# Patient Record
Sex: Female | Born: 1960
Health system: Southern US, Community
[De-identification: ages and names within clinical notes are randomized; demographics above are authoritative.]

## PROBLEM LIST (undated history)

## (undated) DIAGNOSIS — D869 Sarcoidosis, unspecified: Secondary | ICD-10-CM

## (undated) DIAGNOSIS — J45909 Unspecified asthma, uncomplicated: Secondary | ICD-10-CM

## (undated) DIAGNOSIS — Z9289 Personal history of other medical treatment: Secondary | ICD-10-CM

## (undated) DIAGNOSIS — N289 Disorder of kidney and ureter, unspecified: Secondary | ICD-10-CM

## (undated) DIAGNOSIS — F329 Major depressive disorder, single episode, unspecified: Secondary | ICD-10-CM

## (undated) DIAGNOSIS — IMO0002 Reserved for concepts with insufficient information to code with codable children: Secondary | ICD-10-CM

## (undated) DIAGNOSIS — M1990 Unspecified osteoarthritis, unspecified site: Secondary | ICD-10-CM

## (undated) DIAGNOSIS — I509 Heart failure, unspecified: Secondary | ICD-10-CM

## (undated) DIAGNOSIS — E785 Hyperlipidemia, unspecified: Secondary | ICD-10-CM

## (undated) DIAGNOSIS — F32A Depression, unspecified: Secondary | ICD-10-CM

## (undated) DIAGNOSIS — R918 Other nonspecific abnormal finding of lung field: Secondary | ICD-10-CM

## (undated) DIAGNOSIS — R51 Headache: Secondary | ICD-10-CM

## (undated) DIAGNOSIS — I219 Acute myocardial infarction, unspecified: Secondary | ICD-10-CM

## (undated) DIAGNOSIS — I10 Essential (primary) hypertension: Secondary | ICD-10-CM

## (undated) HISTORY — DX: Reserved for concepts with insufficient information to code with codable children: IMO0002

## (undated) HISTORY — DX: Major depressive disorder, single episode, unspecified: F32.9

## (undated) HISTORY — DX: Depression, unspecified: F32.A

## (undated) HISTORY — DX: Hyperlipidemia, unspecified: E78.5

## (undated) HISTORY — DX: Unspecified asthma, uncomplicated: J45.909

## (undated) HISTORY — DX: Sarcoidosis, unspecified: D86.9

## (undated) HISTORY — DX: Other nonspecific abnormal finding of lung field: R91.8

---
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
## 2002-05-30 ENCOUNTER — Emergency Department (HOSPITAL_COMMUNITY): Admission: EM | Admit: 2002-05-30 | Discharge: 2002-05-30 | Payer: Self-pay | Admitting: Emergency Medicine

## 2003-08-10 ENCOUNTER — Emergency Department (HOSPITAL_COMMUNITY): Admission: EM | Admit: 2003-08-10 | Discharge: 2003-08-10 | Payer: Self-pay | Admitting: Emergency Medicine

## 2003-08-10 ENCOUNTER — Encounter: Payer: Self-pay | Admitting: Emergency Medicine

## 2004-08-04 ENCOUNTER — Emergency Department (HOSPITAL_COMMUNITY): Admission: EM | Admit: 2004-08-04 | Discharge: 2004-08-04 | Payer: Self-pay | Admitting: Emergency Medicine

## 2004-11-30 ENCOUNTER — Ambulatory Visit (HOSPITAL_BASED_OUTPATIENT_CLINIC_OR_DEPARTMENT_OTHER): Admission: RE | Admit: 2004-11-30 | Discharge: 2004-11-30 | Payer: Self-pay | Admitting: Family Medicine

## 2005-02-24 ENCOUNTER — Emergency Department (HOSPITAL_COMMUNITY): Admission: EM | Admit: 2005-02-24 | Discharge: 2005-02-24 | Payer: Self-pay | Admitting: Emergency Medicine

## 2005-03-07 ENCOUNTER — Encounter: Admission: RE | Admit: 2005-03-07 | Discharge: 2005-04-10 | Payer: Self-pay | Admitting: Orthopedic Surgery

## 2006-05-13 ENCOUNTER — Emergency Department (HOSPITAL_COMMUNITY): Admission: EM | Admit: 2006-05-13 | Discharge: 2006-05-14 | Payer: Self-pay | Admitting: Emergency Medicine

## 2006-08-02 ENCOUNTER — Emergency Department (HOSPITAL_COMMUNITY): Admission: EM | Admit: 2006-08-02 | Discharge: 2006-08-03 | Payer: Self-pay | Admitting: Emergency Medicine

## 2010-02-23 ENCOUNTER — Emergency Department (HOSPITAL_COMMUNITY): Admission: EM | Admit: 2010-02-23 | Discharge: 2010-02-23 | Payer: Self-pay | Admitting: Emergency Medicine

## 2010-07-03 ENCOUNTER — Emergency Department (HOSPITAL_COMMUNITY): Admission: EM | Admit: 2010-07-03 | Discharge: 2010-07-04 | Payer: Self-pay | Admitting: Emergency Medicine

## 2011-01-15 ENCOUNTER — Encounter: Payer: Self-pay | Admitting: Family Medicine

## 2011-03-11 LAB — POCT I-STAT, CHEM 8
BUN: 8 mg/dL (ref 6–23)
Calcium, Ion: 1.21 mmol/L (ref 1.12–1.32)
Chloride: 105 mEq/L (ref 96–112)
Creatinine, Ser: 0.9 mg/dL (ref 0.4–1.2)
Glucose, Bld: 108 mg/dL — ABNORMAL HIGH (ref 70–99)
HCT: 39 % (ref 36.0–46.0)
Hemoglobin: 13.3 g/dL (ref 12.0–15.0)
Potassium: 3.2 mEq/L — ABNORMAL LOW (ref 3.5–5.1)
Sodium: 142 mEq/L (ref 135–145)
TCO2: 27 mmol/L (ref 0–100)

## 2011-03-11 LAB — D-DIMER, QUANTITATIVE: D-Dimer, Quant: 0.84 ug/mL-FEU — ABNORMAL HIGH (ref 0.00–0.48)

## 2011-05-11 NOTE — Procedures (Signed)
Kari [name], Kari [name]             ACCOUNT NO.:  [id number]   MEDICAL RECORD NO.:  [id number]          PATIENT TYPE:  OUT   LOCATION:  SLEEP CENTER                 FACILITY:  [facility]   PHYSICIAN:  Clinton D. Annamaria Boots, M.D. DATE OF BIRTH:  1961-05-18   DATE OF STUDY:  11/30/2004                              NOCTURNAL POLYSOMNOGRAM   REFERRING PHYSICIAN:  Dr. Lucianne Lei.   INDICATION FOR STUDY:  Hypersomnia with sleep apnea.   EPWORTH SLEEPINESS SCORE:  Epworth score is 12/24. BMI 48.4. Weight 309  pounds.   SLEEP ARCHITECTURE:  Total sleep time 333 minutes with sleep efficiency of  77%.  Stage 1 was 4%, stage 2 86%, stages 3, 4 and 5% REM was 6% of total  sleep time. Sleep latency 61 minutes. REM latency 283 minutes.  Awake after  sleep onset 38 minutes. Arousable index 14.   RESPIRATORY DATA:  RDI 4.0 per hour which is within normal limits for  frequency of obstructive events and does not meet diagnostic criteria for  obstructive sleep apnea syndrome.  There was one obstructive apnea and 21  hypopnea's.  Events were not positional but most sleep was supine.  REM RDI  was 40 per hour.   OXYGEN DATA:  Very light occasional snoring with oxygen desaturation with a  nadir of 85%.  Mean oxygen saturation through the study was 94% on room air.   CARDIAC DATA:  Normal sinus rhythm with frequent PVCs, some multifocal.   MOVEMENT/PARASOMNIA:  Occasional leg jerk with insignificant effect on  sleep.   IMPRESSION/RECOMMENDATION:  Occasional obstructive apnea and hypopnea's, RDI  4 per hour, within normal limits and not meeting scoring criteria for  obstructive sleep apnea syndrome.  The patient may benefit from weight loss  and recommendation that she sleep off ____ her back.  Incidental note that  there were frequent PVCs.                                                           Clinton D. Annamaria Boots, M.D.  Diplomate, [employer]   CDY/MEDQ  D:  12/03/2004 13:28:04  T:  12/04/2004  [id number]  Job:  [id number]

## 2011-12-22 ENCOUNTER — Encounter: Payer: Self-pay | Admitting: *Deleted

## 2011-12-22 ENCOUNTER — Emergency Department (HOSPITAL_COMMUNITY)
Admission: EM | Admit: 2011-12-22 | Discharge: 2011-12-22 | Disposition: A | Discharge status: Home / Self Care | Payer: Medicaid Other | Source: Home / Self Care | Attending: Emergency Medicine | Admitting: Emergency Medicine

## 2011-12-22 DIAGNOSIS — J45909 Unspecified asthma, uncomplicated: Secondary | ICD-10-CM

## 2011-12-22 HISTORY — DX: Essential (primary) hypertension: I10

## 2011-12-22 MED ORDER — GUAIFENESIN-CODEINE 100-10 MG/5ML PO SYRP: 10.0000 mL | ORAL_SOLUTION | Freq: Four times a day (QID) | ORAL | Status: AC | PRN

## 2011-12-22 MED ORDER — METHYLPREDNISOLONE SODIUM SUCC 125 MG IJ SOLR
125.0000 mg | Freq: Once | INTRAMUSCULAR | Status: AC
  Administered 2011-12-22: 125 mg via INTRAMUSCULAR

## 2011-12-22 MED ORDER — ALBUTEROL SULFATE (5 MG/ML) 0.5% IN NEBU
INHALATION_SOLUTION | RESPIRATORY_TRACT | Status: AC
  Filled 2011-12-22: qty 1

## 2011-12-22 MED ORDER — PREDNISONE 10 MG PO TABS: ORAL_TABLET | ORAL | Status: DC

## 2011-12-22 MED ORDER — ALBUTEROL SULFATE HFA 108 (90 BASE) MCG/ACT IN AERS: 1.0000 | INHALATION_SPRAY | Freq: Four times a day (QID) | RESPIRATORY_TRACT | Status: DC | PRN

## 2011-12-22 MED ORDER — AZITHROMYCIN 250 MG PO TABS: ORAL_TABLET | ORAL | Status: AC

## 2011-12-22 MED ORDER — IPRATROPIUM BROMIDE 0.02 % IN SOLN
0.5000 mg | Freq: Once | RESPIRATORY_TRACT | Status: AC
  Administered 2011-12-22: 0.5 mg via RESPIRATORY_TRACT

## 2011-12-22 MED ORDER — ALBUTEROL SULFATE (5 MG/ML) 0.5% IN NEBU
5.0000 mg | INHALATION_SOLUTION | Freq: Once | RESPIRATORY_TRACT | Status: AC
  Administered 2011-12-22: 5 mg via RESPIRATORY_TRACT

## 2011-12-22 MED ORDER — METHYLPREDNISOLONE SODIUM SUCC 125 MG IJ SOLR
INTRAMUSCULAR | Status: AC
  Filled 2011-12-22: qty 2

## 2011-12-22 NOTE — ED Notes (Signed)
Breathing treatment in progress.  Pt demonstrating proper use.

## 2011-12-22 NOTE — ED Notes (Signed)
Breathing treatment complete.  Pt states she is feeling a little better following treatment.  Occasional faint inspiratory wheezing to right upper lung.

## 2011-12-22 NOTE — ED Provider Notes (Signed)
History     CSN: [id number]  Arrival date & time 12/22/11  1212   First MD Initiated Contact with Patient 12/22/11 1408      Chief Complaint  Patient presents with  . Nasal Congestion  . Cough  . Wheezing  . Headache  . Generalized Body Aches    (Consider location/radiation/quality/duration/timing/severity/associated sxs/prior treatment) HPI Comments: Shawntelle has had a one-week history of aching all over, chills, fatigue, wheezing, shortness of breath, dry cough, chest pain, headache, rhinorrhea, nausea, vomiting, and diarrhea. She denies any fever and she has had no prior history of asthma or COPD. She has never smoked cigarettes.  Patient is a 50 y.o. female presenting with cough, wheezing, and headaches.  Cough Associated symptoms include chills, headaches, rhinorrhea and wheezing. Pertinent negatives include no ear pain, no sore throat, no shortness of breath and no eye redness.  Wheezing  Associated symptoms include rhinorrhea, cough and wheezing. Pertinent negatives include no fever, no sore throat and no shortness of breath.  Headache The primary symptoms include headaches, nausea and vomiting. Primary symptoms do not include fever.  The headache is not associated with eye pain or neck stiffness.  Additional symptoms do not include neck stiffness.    Past Medical History  Diagnosis Date  . Hypertension     History reviewed. No pertinent past surgical history.  History reviewed. No pertinent family history.  History  Substance Use Topics  . Smoking status: Never Smoker   . Smokeless tobacco: Not on file  . Alcohol Use: No    OB History    Grav Para Term Preterm Abortions TAB SAB Ect Mult Living                  Review of Systems  Constitutional: Positive for chills and fatigue. Negative for fever.  HENT: Positive for rhinorrhea. Negative for ear pain, congestion, sore throat, sneezing, neck stiffness, voice change and postnasal drip.   Eyes: Negative for  pain, discharge and redness.  Respiratory: Positive for cough and wheezing. Negative for chest tightness and shortness of breath.   Gastrointestinal: Positive for nausea, vomiting and diarrhea. Negative for abdominal pain.  Skin: Negative for rash.  Neurological: Positive for headaches.    Allergies  Review of patient's allergies indicates no known allergies.  Home Medications   Current Outpatient Rx  Name Route Sig Dispense Refill  . LISINOPRIL-HYDROCHLOROTHIAZIDE PO Oral Take by mouth.      Marland Kitchen ALKA-SELTZER PLS NIGHT CLD/FLU PO Oral Take by mouth.      Marland Kitchen PSEUDOEPHEDRINE-ACETAMINOPHEN 30-500 MG PO TABS Oral Take 1 tablet by mouth every 4 (four) hours as needed.      . ALBUTEROL SULFATE HFA 108 (90 BASE) MCG/ACT IN AERS Inhalation Inhale 1-2 puffs into the lungs every 6 (six) hours as needed for wheezing. 1 Inhaler 0  . AZITHROMYCIN 250 MG PO TABS  Take as directed. 6 tablet 0  . GUAIFENESIN-CODEINE 100-10 MG/5ML PO SYRP Oral Take 10 mLs by mouth 4 (four) times daily as needed for cough. 120 mL 0  . PREDNISONE 10 MG PO TABS  Take 4 tabs daily for 4 days, 3 tabs daily for 4 days, 2 tabs daily for 4 days, then 1 tab daily for 4 days.  Take all tabs at one time with food and preferably in the morning except for the first dose. 40 tablet 0    BP 183/100  Pulse 72  Temp(Src) 99.1 F (37.3 C) (Oral)  Resp 26  SpO2 98%  LMP 11/21/2011  Physical Exam  Nursing note and vitals reviewed. Constitutional: She appears well-developed and well-nourished. No distress (she is in no respiratory distress).  HENT:  Head: Normocephalic and atraumatic.  Right Ear: External ear normal.  Left Ear: External ear normal.  Nose: Nose normal.  Mouth/Throat: Oropharynx is clear and moist. No oropharyngeal exudate.  Eyes: Conjunctivae and EOM are normal. Pupils are equal, round, and reactive to light. Right eye exhibits no discharge. Left eye exhibits no discharge.  Neck: Normal range of motion. Neck supple.    Cardiovascular: Normal rate, regular rhythm and normal heart sounds.   Pulmonary/Chest: Effort normal. No stridor. No respiratory distress. She has wheezes (she has bilateral expiratory wheezes anteriorly and posteriorly). She has no rales. She exhibits no tenderness.  Lymphadenopathy:    She has no cervical adenopathy.  Skin: Skin is warm and dry. No rash noted. She is not diaphoretic.    ED Course  Procedures (including critical care time)  After receiving a breathing treatment with duo neb and Solu-Medrol 125 mg IM, she felt better. Auscultation of her lungs reveals still some wheezes, but much improved. There were no rales and she had good air movement bilaterally and was in no respiratory distress.  Labs Reviewed - No data to display No results found.   1. Asthmatic bronchitis       MDM  She has asthmatic bronchitis. She improved here at the urgent care Center with [facility] and Solu-Medrol. She was sent home on a prednisone taper, a Z-Pak, albuterol inhaler, and guaifenesin/codeine cough syrup. She was told to followup in 3 or 4 days if no improvement or immediately if she should become worse in any way, especially with difficulty breathing.        Birdena Crandall, MD 12/22/11 [phone]

## 2011-12-22 NOTE — ED Notes (Signed)
Pt with onset of cough/congestion/headache/aching all over/wheezing x one week worse tody

## 2012-06-20 ENCOUNTER — Inpatient Hospital Stay (HOSPITAL_COMMUNITY)
Admission: EM | Admit: 2012-06-20 | Discharge: 2012-06-25 | DRG: 313 | Disposition: A | Discharge status: Home / Self Care | Payer: Medicaid Other | Attending: Internal Medicine | Admitting: Internal Medicine

## 2012-06-20 ENCOUNTER — Emergency Department (HOSPITAL_COMMUNITY): Payer: Medicaid Other

## 2012-06-20 ENCOUNTER — Encounter (HOSPITAL_COMMUNITY): Payer: Self-pay | Admitting: Emergency Medicine

## 2012-06-20 DIAGNOSIS — R059 Cough, unspecified: Secondary | ICD-10-CM | POA: Diagnosis present

## 2012-06-20 DIAGNOSIS — L97909 Non-pressure chronic ulcer of unspecified part of unspecified lower leg with unspecified severity: Secondary | ICD-10-CM | POA: Diagnosis present

## 2012-06-20 DIAGNOSIS — R079 Chest pain, unspecified: Secondary | ICD-10-CM

## 2012-06-20 DIAGNOSIS — L03119 Cellulitis of unspecified part of limb: Secondary | ICD-10-CM | POA: Diagnosis not present

## 2012-06-20 DIAGNOSIS — D649 Anemia, unspecified: Secondary | ICD-10-CM | POA: Diagnosis present

## 2012-06-20 DIAGNOSIS — I872 Venous insufficiency (chronic) (peripheral): Secondary | ICD-10-CM | POA: Diagnosis not present

## 2012-06-20 DIAGNOSIS — I472 Ventricular tachycardia, unspecified: Secondary | ICD-10-CM | POA: Diagnosis present

## 2012-06-20 DIAGNOSIS — B349 Viral infection, unspecified: Secondary | ICD-10-CM

## 2012-06-20 DIAGNOSIS — E876 Hypokalemia: Secondary | ICD-10-CM | POA: Diagnosis present

## 2012-06-20 DIAGNOSIS — L02419 Cutaneous abscess of limb, unspecified: Secondary | ICD-10-CM | POA: Diagnosis not present

## 2012-06-20 DIAGNOSIS — Z79899 Other long term (current) drug therapy: Secondary | ICD-10-CM

## 2012-06-20 DIAGNOSIS — R911 Solitary pulmonary nodule: Secondary | ICD-10-CM | POA: Diagnosis present

## 2012-06-20 DIAGNOSIS — I10 Essential (primary) hypertension: Secondary | ICD-10-CM | POA: Diagnosis present

## 2012-06-20 DIAGNOSIS — I4729 Other ventricular tachycardia: Secondary | ICD-10-CM | POA: Diagnosis present

## 2012-06-20 DIAGNOSIS — R509 Fever, unspecified: Secondary | ICD-10-CM

## 2012-06-20 DIAGNOSIS — IMO0002 Reserved for concepts with insufficient information to code with codable children: Secondary | ICD-10-CM

## 2012-06-20 DIAGNOSIS — R0789 Other chest pain: Principal | ICD-10-CM | POA: Diagnosis present

## 2012-06-20 DIAGNOSIS — R05 Cough: Secondary | ICD-10-CM | POA: Diagnosis present

## 2012-06-20 LAB — CBC
HCT: 38.3 % (ref 36.0–46.0)
Hemoglobin: 12.8 g/dL (ref 12.0–15.0)
MCH: 30 pg (ref 26.0–34.0)
MCHC: 33.4 g/dL (ref 30.0–36.0)
MCV: 89.7 fL (ref 78.0–100.0)
Platelets: 255 10*3/uL (ref 150–400)
RBC: 4.27 MIL/uL (ref 3.87–5.11)
RDW: 14 % (ref 11.5–15.5)
WBC: 8.7 10*3/uL (ref 4.0–10.5)

## 2012-06-20 LAB — URINALYSIS, ROUTINE W REFLEX MICROSCOPIC
Bilirubin Urine: NEGATIVE
Glucose, UA: NEGATIVE mg/dL
Hgb urine dipstick: NEGATIVE
Ketones, ur: NEGATIVE mg/dL
Nitrite: NEGATIVE
Protein, ur: NEGATIVE mg/dL
Specific Gravity, Urine: 1.018 (ref 1.005–1.030)
Urobilinogen, UA: 1 mg/dL (ref 0.0–1.0)
pH: 7.5 (ref 5.0–8.0)

## 2012-06-20 LAB — BASIC METABOLIC PANEL
BUN: 11 mg/dL (ref 6–23)
CO2: 30 mEq/L (ref 19–32)
Calcium: 9.5 mg/dL (ref 8.4–10.5)
Chloride: 98 mEq/L (ref 96–112)
Creatinine, Ser: 0.82 mg/dL (ref 0.50–1.10)
GFR calc Af Amer: >90 mL/min (ref >90)
GFR calc non Af Amer: 82 mL/min — ABNORMAL LOW (ref >90)
Glucose, Bld: 123 mg/dL — ABNORMAL HIGH (ref 70–99)
Potassium: 2.8 mEq/L — ABNORMAL LOW (ref 3.5–5.1)
Sodium: 136 mEq/L (ref 135–145)

## 2012-06-20 LAB — POCT I-STAT TROPONIN I: Troponin i, poc: 0 ng/mL (ref 0.00–0.08)

## 2012-06-20 LAB — URINE MICROSCOPIC-ADD ON

## 2012-06-20 MED ORDER — POTASSIUM CHLORIDE CRYS ER 20 MEQ PO TBCR
20.0000 meq | EXTENDED_RELEASE_TABLET | Freq: Once | ORAL | Status: AC
  Administered 2012-06-20: 20 meq via ORAL
  Filled 2012-06-20: qty 1

## 2012-06-20 MED ORDER — ACETAMINOPHEN 325 MG PO TABS
650.0000 mg | ORAL_TABLET | Freq: Once | ORAL | Status: AC
  Administered 2012-06-20: 650 mg via ORAL
  Filled 2012-06-20: qty 1

## 2012-06-20 MED ORDER — SODIUM CHLORIDE 0.9 % IV BOLUS (SEPSIS)
500.0000 mL | Freq: Once | INTRAVENOUS | Status: AC
  Administered 2012-06-20: 500 mL via INTRAVENOUS

## 2012-06-20 NOTE — ED Provider Notes (Signed)
History     CSN: [id number]  Arrival date & time 06/20/12  1824   None     Chief Complaint  Patient presents with  . Chest Pain    (Consider location/radiation/quality/duration/timing/severity/associated sxs/prior treatment) HPI  51 year old female past medical history of hypertension and intermittent leg swelling presents today with a 10 hour history of central chest pressure which started this morning. It has been constant and getting better throughout the day. Is a 2/10 right now. She's had a history of leg swelling, which her doctor treated with lisinopril intermittently. She has no history of heart disease. Chest and arm or jaw pain, nausea or vomiting diarrhea, and she denies for me any shortness of breath as described in the nursing notes. She said that she is slow to breathe because there is a pressure when she breathes, but she is not short of breath. She also denies diaphoresis. She denies any meaningful dyspnea with exertion. She denies any orthopnea or paroxysmal nocturnal dyspnea. Nothing makes her symptoms better or worse. She calls her illness mild to moderate.  She is tachycardic on arrival but otherwise her vital signs are stable.  Past Medical History  Diagnosis Date  . Hypertension     History reviewed. No pertinent past surgical history.  No family history on file.  History  Substance Use Topics  . Smoking status: Never Smoker   . Smokeless tobacco: Not on file  . Alcohol Use: No    OB History    Grav Para Term Preterm Abortions TAB SAB Ect Mult Living                  Review of Systems Constitutional: Negative for fever and chills.  HENT: Negative for ear pain, sore throat and trouble swallowing.   Eyes: Negative for pain and visual disturbance.  Respiratory: Negative for cough and shortness of breath.   Cardiovascular: POS for chest pain and leg swelling.  Gastrointestinal: Negative for nausea, vomiting, abdominal pain and diarrhea.    Genitourinary: Negative for dysuria, urgency and frequency.  Musculoskeletal: Negative for back pain and joint swelling.  Skin: Negative for rash and wound.  Neurological: Negative for dizziness, syncope, speech difficulty, weakness and numbness.   Allergies  Review of patient's allergies indicates no known allergies.  Home Medications   Current Outpatient Rx  Name Route Sig Dispense Refill  . ALBUTEROL SULFATE HFA 108 (90 BASE) MCG/ACT IN AERS Inhalation Inhale 1-2 puffs into the lungs every 6 (six) hours as needed for wheezing. 1 Inhaler 0  . LISINOPRIL-HYDROCHLOROTHIAZIDE PO Oral Take by mouth.      Marland Kitchen ALKA-SELTZER PLS NIGHT CLD/FLU PO Oral Take by mouth.      Marland Kitchen PREDNISONE 10 MG PO TABS  Take 4 tabs daily for 4 days, 3 tabs daily for 4 days, 2 tabs daily for 4 days, then 1 tab daily for 4 days.  Take all tabs at one time with food and preferably in the morning except for the first dose. 40 tablet 0  . PSEUDOEPHEDRINE-ACETAMINOPHEN 30-500 MG PO TABS Oral Take 1 tablet by mouth every 4 (four) hours as needed.        BP 142/91  Pulse 112  Temp 101.8 F (38.8 C) (Oral)  Resp 16  SpO2 98%  LMP 11/21/2011  Physical Exam Consitutional: Pt in no acute distress.   Head: Normocephalic and atraumatic.  Eyes: Extraocular motion intact, no scleral icterus. Mild to moderate injection.  Neck: Supple without meningismus, mass, or overt JVD  Respiratory: Effort normal and breath sounds normal. No respiratory distress. CV: Heart regular rate and regular rhythm (sinus), no obvious murmurs.  Pulses +2 and symmetric Abdomen: Soft, non-tender, non-distended. No rebound or guarding.  MSK: Extremities are atraumatic without deformity, ROM intact.  2+ bilateral pitting edema lower studies. Weeping lesion left lateral calf has actually gotten better recently. Skin: Warm, dry, intact Neuro: Alert and oriented, no motor deficit noted.   Psychiatric: Mood and affect are normal    ED Course   Procedures (including critical care time)  Labs Reviewed  BASIC METABOLIC PANEL - Abnormal; Notable for the following:    Potassium 2.8 (*)     Glucose, Bld 123 (*)     GFR calc non Af Amer 82 (*)     All other components within normal limits  CBC  POCT I-STAT TROPONIN I   Dg Chest 2 View  06/20/2012  *RADIOLOGY REPORT*  Clinical Data: Chest pain  CHEST - 2 VIEW  Comparison: 05/13/2006  Findings: Heart size appears enlarged.  No pleural effusion or edema.  No airspace consolidation.  Prominence of the pulmonary arteries is noted which may be seen with PA hypertension.  IMPRESSION:  1.  Cardiac enlargement. 2.  No acute findings.  Original Report Authenticated By: Angelita Ingles, M.D.     Clinical impression: Fever.  Tachypnea. Possible PE  MDM   Patient chief complaint of chest pain. This pain is nonexertional. His been constant all day long. Her troponin is negative. Accordingly this is not consistent with acute coronary syndrome the However patient does have documented fever here.  Not consistent with pulmonary embolus. The patient is not hypoxic and does not have a sense of shortness of breath as described above under the history of present illness. She is however tachycardic, but she does have a fever. We will treat with fluids and monitor her tachycardia.   Her tachycardia has moderated nicely with fluids. However it is still present. While sleeping she is tachypneic at 44 per minute. Her chest x-ray suggests pulmonary hypertension.  Given her overall clinical picture, patient will now need a PE workup. CT angio ordered. She's been hydrated with 500 cc prior to contrast load.  Pt Care to Dr Vanessa Kick with CT angio and results from breathing treatment pending.       Edwin Cap, MD 06/21/12 [phone]

## 2012-06-20 NOTE — ED Notes (Signed)
Updated in w/r 

## 2012-06-20 NOTE — ED Notes (Signed)
Pt c/o mid upper chest soreness  onset 1400 after returning from work.  Positioning lying down decreases the pain.  Took ibuprofen at 1500 hrs  For HA with out relief.  Rates HA 5/10 frontal HA/  Denies nausea and vomiting.  States diaphoresis in waiting area.Denies previous .  Cardiac monitor 113 ST

## 2012-06-20 NOTE — ED Notes (Signed)
C/o dull pain in center of chest with mild sob and cold chills since 2pm.  Reports swelling to bilateral lower extremities.

## 2012-06-21 ENCOUNTER — Emergency Department (HOSPITAL_COMMUNITY): Payer: Medicaid Other

## 2012-06-21 DIAGNOSIS — R072 Precordial pain: Secondary | ICD-10-CM

## 2012-06-21 LAB — BASIC METABOLIC PANEL
BUN: 11 mg/dL (ref 6–23)
CO2: 27 mEq/L (ref 19–32)
Calcium: 8.8 mg/dL (ref 8.4–10.5)
Chloride: 101 mEq/L (ref 96–112)
Creatinine, Ser: 0.82 mg/dL (ref 0.50–1.10)
GFR calc Af Amer: >90 mL/min (ref >90)
GFR calc non Af Amer: 82 mL/min — ABNORMAL LOW (ref >90)
Glucose, Bld: 136 mg/dL — ABNORMAL HIGH (ref 70–99)
Potassium: 3.2 mEq/L — ABNORMAL LOW (ref 3.5–5.1)
Sodium: 138 mEq/L (ref 135–145)

## 2012-06-21 LAB — CARDIAC PANEL(CRET KIN+CKTOT+MB+TROPI)
CK, MB: 2 ng/mL (ref 0.3–4.0)
CK, MB: 2.2 ng/mL (ref 0.3–4.0)
CK, MB: 2 ng/mL (ref 0.3–4.0)
Relative Index: 1.5 (ref 0.0–2.5)
Relative Index: 1.7 (ref 0.0–2.5)
Relative Index: 1.5 (ref 0.0–2.5)
Total CK: 133 U/L (ref 7–177)
Total CK: 133 U/L (ref 7–177)
Total CK: 133 U/L (ref 7–177)
Troponin I: <0.3 ng/mL (ref <0.30)
Troponin I: <0.3 ng/mL (ref <0.30)
Troponin I: <0.3 ng/mL (ref <0.30)

## 2012-06-21 LAB — MAGNESIUM: Magnesium: 1.8 mg/dL (ref 1.5–2.5)

## 2012-06-21 LAB — LIPID PANEL
Cholesterol: 135 mg/dL (ref 0–200)
HDL: 54 mg/dL (ref >39)
LDL Cholesterol: 69 mg/dL (ref 0–99)
Total CHOL/HDL Ratio: 2.5 RATIO
Triglycerides: 59 mg/dL (ref <150)
VLDL: 12 mg/dL (ref 0–40)

## 2012-06-21 LAB — PHOSPHORUS: Phosphorus: 3.1 mg/dL (ref 2.3–4.6)

## 2012-06-21 LAB — HEMOGLOBIN A1C
Hgb A1c MFr Bld: 6 % — ABNORMAL HIGH (ref <5.7)
Mean Plasma Glucose: 126 mg/dL — ABNORMAL HIGH (ref <117)

## 2012-06-21 LAB — TSH: TSH: 2.323 u[IU]/mL (ref 0.350–4.500)

## 2012-06-21 LAB — LACTIC ACID, PLASMA: Lactic Acid, Venous: 1.1 mmol/L (ref 0.5–2.2)

## 2012-06-21 LAB — PRO B NATRIURETIC PEPTIDE: Pro B Natriuretic peptide (BNP): 415.7 pg/mL — ABNORMAL HIGH (ref 0–125)

## 2012-06-21 MED ORDER — ACETAMINOPHEN 325 MG PO TABS
650.0000 mg | ORAL_TABLET | Freq: Four times a day (QID) | ORAL | Status: DC | PRN
  Administered 2012-06-22: 650 mg via ORAL
  Filled 2012-06-21: qty 2

## 2012-06-21 MED ORDER — BACITRACIN-NEOMYCIN-POLYMYXIN OINTMENT TUBE
TOPICAL_OINTMENT | Freq: Four times a day (QID) | CUTANEOUS | Status: DC | PRN
  Filled 2012-06-21: qty 15

## 2012-06-21 MED ORDER — ALBUTEROL SULFATE (5 MG/ML) 0.5% IN NEBU: 5.0000 mg | INHALATION_SOLUTION | Freq: Once | RESPIRATORY_TRACT | Status: DC

## 2012-06-21 MED ORDER — SODIUM CHLORIDE 0.9 % IJ SOLN
3.0000 mL | Freq: Two times a day (BID) | INTRAMUSCULAR | Status: DC
  Administered 2012-06-21 – 2012-06-24 (×7): 3 mL via INTRAVENOUS

## 2012-06-21 MED ORDER — ZOLPIDEM TARTRATE 5 MG PO TABS
5.0000 mg | ORAL_TABLET | Freq: Every evening | ORAL | Status: DC | PRN
  Administered 2012-06-22: 5 mg via ORAL
  Filled 2012-06-21: qty 1

## 2012-06-21 MED ORDER — ASPIRIN EC 325 MG PO TBEC
325.0000 mg | DELAYED_RELEASE_TABLET | Freq: Every day | ORAL | Status: DC
  Administered 2012-06-21 – 2012-06-24 (×4): 325 mg via ORAL
  Filled 2012-06-21 (×3): qty 1

## 2012-06-21 MED ORDER — SODIUM CHLORIDE 0.9 % IV SOLN
INTRAVENOUS | Status: DC
  Administered 2012-06-21: 11:00:00 via INTRAVENOUS

## 2012-06-21 MED ORDER — HYDROCHLOROTHIAZIDE 12.5 MG PO CAPS
12.5000 mg | ORAL_CAPSULE | Freq: Every day | ORAL | Status: DC
  Administered 2012-06-21 – 2012-06-23 (×3): 12.5 mg via ORAL
  Filled 2012-06-21 (×5): qty 1

## 2012-06-21 MED ORDER — POTASSIUM CHLORIDE CRYS ER 20 MEQ PO TBCR
40.0000 meq | EXTENDED_RELEASE_TABLET | Freq: Once | ORAL | Status: AC
  Administered 2012-06-21: 40 meq via ORAL
  Filled 2012-06-21: qty 2

## 2012-06-21 MED ORDER — LOSARTAN POTASSIUM-HCTZ 100-12.5 MG PO TABS: 1.0000 | ORAL_TABLET | Freq: Every day | ORAL | Status: DC

## 2012-06-21 MED ORDER — ONDANSETRON HCL 4 MG/2ML IJ SOLN: 4.0000 mg | Freq: Four times a day (QID) | INTRAMUSCULAR | Status: DC | PRN

## 2012-06-21 MED ORDER — LOSARTAN POTASSIUM 50 MG PO TABS
100.0000 mg | ORAL_TABLET | Freq: Every day | ORAL | Status: DC
  Administered 2012-06-21 – 2012-06-23 (×3): 100 mg via ORAL
  Filled 2012-06-21 (×5): qty 2

## 2012-06-21 MED ORDER — HYDROCODONE-ACETAMINOPHEN 5-325 MG PO TABS
1.0000 | ORAL_TABLET | ORAL | Status: DC | PRN
  Administered 2012-06-21 – 2012-06-23 (×4): 1 via ORAL
  Administered 2012-06-24: 2 via ORAL
  Filled 2012-06-21 (×3): qty 1
  Filled 2012-06-21: qty 2
  Filled 2012-06-21: qty 1

## 2012-06-21 MED ORDER — ALBUTEROL SULFATE HFA 108 (90 BASE) MCG/ACT IN AERS: 4.0000 | INHALATION_SPRAY | Freq: Once | RESPIRATORY_TRACT | Status: DC

## 2012-06-21 MED ORDER — MORPHINE SULFATE 2 MG/ML IJ SOLN: 1.0000 mg | INTRAMUSCULAR | Status: DC | PRN

## 2012-06-21 MED ORDER — IBUPROFEN 800 MG PO TABS
800.0000 mg | ORAL_TABLET | Freq: Once | ORAL | Status: AC
  Administered 2012-06-21: 800 mg via ORAL
  Filled 2012-06-21: qty 1

## 2012-06-21 MED ORDER — ONDANSETRON HCL 4 MG PO TABS: 4.0000 mg | ORAL_TABLET | Freq: Four times a day (QID) | ORAL | Status: DC | PRN

## 2012-06-21 MED ORDER — ALBUTEROL SULFATE HFA 108 (90 BASE) MCG/ACT IN AERS: 1.0000 | INHALATION_SPRAY | Freq: Four times a day (QID) | RESPIRATORY_TRACT | Status: DC | PRN

## 2012-06-21 MED ORDER — HEPARIN SODIUM (PORCINE) 5000 UNIT/ML IJ SOLN
5000.0000 [IU] | Freq: Three times a day (TID) | INTRAMUSCULAR | Status: DC
  Administered 2012-06-21 – 2012-06-24 (×10): 5000 [IU] via SUBCUTANEOUS
  Filled 2012-06-21 (×16): qty 1

## 2012-06-21 MED ORDER — IOHEXOL 350 MG/ML SOLN
100.0000 mL | Freq: Once | INTRAVENOUS | Status: AC | PRN
  Administered 2012-06-21: 100 mL via INTRAVENOUS

## 2012-06-21 MED ORDER — ALBUTEROL SULFATE (5 MG/ML) 0.5% IN NEBU
5.0000 mg | INHALATION_SOLUTION | Freq: Once | RESPIRATORY_TRACT | Status: AC
  Administered 2012-06-21: 5 mg via RESPIRATORY_TRACT
  Filled 2012-06-21: qty 1

## 2012-06-21 MED ORDER — SODIUM CHLORIDE 0.9 % IV SOLN: INTRAVENOUS | Status: DC

## 2012-06-21 MED ORDER — PNEUMOCOCCAL VAC POLYVALENT 25 MCG/0.5ML IJ INJ
0.5000 mL | INJECTION | INTRAMUSCULAR | Status: AC
  Administered 2012-06-22: 0.5 mL via INTRAMUSCULAR
  Filled 2012-06-21: qty 0.5

## 2012-06-21 NOTE — ED Provider Notes (Deleted)
History     CSN: [id number]  Arrival date & time 06/20/12  Kari [name]   First MD Initiated Contact with Patient 06/20/12 2242      Chief Complaint  Patient presents with  . Chest Pain    (Consider location/radiation/quality/duration/timing/severity/associated sxs/prior treatment) HPI  Past Medical History  Diagnosis Date  . Hypertension     History reviewed. No pertinent past surgical history.  No family history on file.  History  Substance Use Topics  . Smoking status: Never Smoker   . Smokeless tobacco: Not on file  . Alcohol Use: No    OB History    Grav Para Term Preterm Abortions TAB SAB Ect Mult Living                  Review of Systems  Allergies  Review of patient's allergies indicates no known allergies.  Home Medications   Current Outpatient Rx  Name Route Sig Dispense Refill  . ALBUTEROL SULFATE HFA 108 (90 BASE) MCG/ACT IN AERS Inhalation Inhale 1-2 puffs into the lungs every 6 (six) hours as needed for wheezing. 1 Inhaler 0  . IBUPROFEN 200 MG PO TABS Oral Take 200 mg by mouth every 6 (six) hours as needed. For pain    . LOSARTAN POTASSIUM-HCTZ 100-12.5 MG PO TABS Oral Take 1 tablet by mouth daily.    Marland Kitchen ZOLPIDEM TARTRATE 5 MG PO TABS Oral Take 5 mg by mouth at bedtime as needed. For sleep      BP 118/65  Pulse 76  Temp 99.5 F (37.5 C) (Oral)  Resp 20  SpO2 96%  LMP 11/21/2011  Physical Exam  ED Course  Procedures (including critical care time)  Labs Reviewed  BASIC METABOLIC PANEL - Abnormal; Notable for the following:    Potassium 2.8 (*)     Glucose, Bld 123 (*)     GFR calc non Af Amer 82 (*)     All other components within normal limits  URINALYSIS, ROUTINE W REFLEX MICROSCOPIC - Abnormal; Notable for the following:    Leukocytes, UA SMALL (*)     All other components within normal limits  CBC  POCT I-STAT TROPONIN I  LACTIC ACID, PLASMA  URINE MICROSCOPIC-ADD ON   Dg Chest 2 View  06/20/2012  *RADIOLOGY REPORT*  Clinical  Data: Chest pain  CHEST - 2 VIEW  Comparison: 05/13/2006  Findings: Heart size appears enlarged.  No pleural effusion or edema.  No airspace consolidation.  Prominence of the pulmonary arteries is noted which may be seen with PA hypertension.  IMPRESSION:  1.  Cardiac enlargement. 2.  No acute findings.  Original Report Authenticated By: Angelita Ingles, M.D.   Ct Angio Chest W/cm &/or Wo Cm  06/21/2012  *RADIOLOGY REPORT*  Clinical Data: Tachypnea, shortness of breath, chest pain.  CT ANGIOGRAPHY CHEST  Technique:  Multidetector CT imaging of the chest using the standard protocol during bolus administration of intravenous contrast. Multiplanar reconstructed images including MIPs were obtained and reviewed to evaluate the vascular anatomy.  Contrast: 160mL OMNIPAQUE IOHEXOL 350 MG/ML SOLN  Comparison: 06/20/2012 radiograph  Findings: Suboptimal contrast bolus.  No main branch or lobar filling defect identified. The more peripheral branches are poorly opacified.  Cardiomegaly.  No pleural or pericardial effusion.  There are prominent mediastinal and hilar lymph nodes.  As index, a prevascular lymph node measures 1.2 cm short access.  Small hiatal hernia.  Degraded by respiratory motion.  Central airways are patent. Increased bronchovascular bundle thickening.  Linear lung base opacities.  Right lower lung nodule measures 6 mm. Question areas of ground-glass opacity within the left lower lobe and questionable 6 mm nodule.  Limited images through the upper abdomen show no acute abnormality.  No acute osseous finding.  IMPRESSION: Suboptimal contrast bolus timing.  No main or lobar branch pulmonary arterial filling defect identified.  Mild areas of ground-glass opacity may reflect atypical infection or edema.  Mildly prominent mediastinal and hilar lymph nodes, may be reactive however can also be seen with other conditions such as sarcoidosis given the concurrent bronchovascular bundle thickening.  Correlate with  clinical presentation, risk factors, and consider short-term follow-up.  Small hiatal hernia.  Distal esophageal wall thickening is nonspecific however can be seen with esophagitis or gastroesophageal reflux disease.  6 mm right lower lobe nodule. If the patient is at high risk for bronchogenic carcinoma, follow-up chest CT at 6-12 months is recommended.  If the patient is at low risk for bronchogenic carcinoma, follow-up chest CT at 12 months is recommended.  This recommendation follows the consensus statement: Guidelines for Management of Small Pulmonary Nodules Detected on CT Scans: A Statement from the [employer] as published in Radiology 2005; 237:395-400.  Original Report Authenticated By: Suanne Marker, M.D.     1. Fever   2. Viral syndrome       MDM  Chest pain - resolved Brief Ventricular tachycardia- resolved.        Barbara Cower, MD 06/21/12 [phone]

## 2012-06-21 NOTE — ED Notes (Addendum)
Pt resting quietly at the time. Denies pain. Vital signs stable. She is alert and oriented x4. No signs of distress noted.

## 2012-06-21 NOTE — ED Notes (Signed)
Pt c/o cramping in BLE..  Dr Jules Husbands notified of same.  POtassium given per order.  States HA is returning, and CP 4/10

## 2012-06-21 NOTE — ED Provider Notes (Addendum)
I saw and evaluated the patient, reviewed the resident's note and I agree with the findings and plan. Assumed care from Dr. Carita Pian and linker.  Reviewed chart and reevaluated the patient.  Agree with her assessment and plan.  The CAT scan was technically inadequate, but did not show any large pulmonary emboli.  During the patient's stay in the emergency department.  She had an episode of nonsustained ventricular tachycardia.  It is resolved.  Her symptoms are resolved.  I discussed this with the cardiologist, who recommended admission for further evaluation.  Barbara Cower, MD 06/21/12 [phone]  Spoke with Dr. Doyle Askew.  She will admit  Barbara Cower, MD 06/21/12 [phone]

## 2012-06-21 NOTE — H&P (Signed)
[facility] History and Physical  Kari [name] [id number] DOB: [birth year] DOA: 06/20/2012  Referring physician: ER doctor PCP: No primary provider on file.   Chief Complaint: chest pain  HPI:  Pt is 51 year old female with past medical history of hypertension and intermittent leg swelling who presents today with a 10 hour history of central chest pressure which started this morning. Pt describes it as constant in nature, 7/10 in severity and 2/10 at this moment, radiating to the neck and back area, associated with nausea and progressive leg swelling. SHe denies similar episodes in the past, no other specific associated symptoms, no specific other aggravating or alleviating factors. Pt denies shortness of breath, no abdominal or urinary concerns, no headaches or visual changes, no fevers, no chills, no recent sicknesses or hospitalizations.   Review of Systems:   Constitutional: Negative for fever, chills and malaise/fatigue. Negative for diaphoresis.  HENT: Negative for hearing loss, ear pain, nosebleeds, congestion, sore throat, neck pain, tinnitus and ear discharge.   Eyes: Negative for blurred vision, double vision, photophobia, pain, discharge and redness.  Respiratory: Negative for cough, hemoptysis, sputum production, shortness of breath, wheezing and stridor.   Cardiovascular: Positive for chest pain, leg swelling.  Gastrointestinal: Negative for vomiting and abdominal pain. Negative for heartburn, constipation, blood in stool and melena.  Genitourinary: Negative for dysuria, urgency, frequency, hematuria and flank pain.  Musculoskeletal: Negative for myalgias, back pain, joint pain and falls.  Skin: Negative for itching and rash.  Neurological: Negative for dizziness and weakness. Negative for tingling, tremors, sensory change, speech change, focal weakness, loss of consciousness and headaches.  Endo/Heme/Allergies: Negative for environmental allergies and polydipsia.  Does not bruise/bleed easily.  Psychiatric/Behavioral: Negative for suicidal ideas. The patient is not nervous/anxious.      Past Medical History  Diagnosis Date  . Hypertension    History reviewed. No pertinent past surgical history. Social History:  reports that she has never smoked. She does not have any smokeless tobacco history on file. She reports that she does not drink alcohol or use illicit drugs.  No Known Allergies  No family history on file.  Prior to Admission medications   Medication Sig Start Date End Date Taking? Authorizing Provider  albuterol (PROVENTIL HFA;VENTOLIN HFA) 108 (90 BASE) MCG/ACT inhaler Inhale 1-2 puffs into the lungs every 6 (six) hours as needed for wheezing. 12/22/11 12/21/12 Yes Harden Mo, MD  ibuprofen (ADVIL,MOTRIN) 200 MG tablet Take 200 mg by mouth every 6 (six) hours as needed. For pain   Yes Historical Provider, MD  losartan-hydrochlorothiazide (HYZAAR) 100-12.5 MG per tablet Take 1 tablet by mouth daily.   Yes Historical Provider, MD  zolpidem (AMBIEN) 5 MG tablet Take 5 mg by mouth at bedtime as needed. For sleep   Yes Historical Provider, MD   Physical Exam: Filed Vitals:   06/21/12 0054 06/21/12 0257 06/21/12 0428 06/21/12 0604  BP: 111/37 103/49 118/80 118/65  Pulse: 106 100 73 76  Temp: 101.3 F (38.5 C) 99.5 F (37.5 C)    TempSrc: Oral Oral    Resp: 30 28 18 20   SpO2: 96% 97% 100% 96%   BP 108/73  Pulse 75  Temp 97.7 F (36.5 C) (Oral)  Resp 20  Ht 5\' 6"  (1.676 m)  Wt 148.326 kg (327 lb)  BMI 52.78 kg/m2  SpO2 93%  LMP 11/21/2011  General Appearance:    Alert, cooperative, no distress, appears stated age  Head:    Normocephalic, without obvious abnormality, atraumatic  Eyes:    PERRL, conjunctiva/corneas clear, EOM's intact, fundi    benign, both eyes  Throat:   Lips, mucosa, and tongue normal; teeth and gums normal  Neck:   Supple, symmetrical, trachea midline, no adenopathy;    thyroid:  no carotid bruit or  JVD  Back:     Symmetric, no curvature, ROM normal, no CVA tenderness  Lungs:     Clear to auscultation bilaterally, respirations unlabored  Chest Wall:    No tenderness or deformity   Heart:    Regular rhythm, tachycardic, S1/S2 +, no murmur, gallop  Abdomen:     Soft, non-tender, bowel sounds active all four quadrants,    no masses, no organomegaly  Extremities:   Extremities normal, atraumatic, no cyanosis or edema  Pulses:   2+ and symmetric all extremities  Skin:   Skin color, texture, turgor normal, no rashes or lesions  Neurologic:   CNII-XII intact, normal strength, sensation and reflexes    throughout    Labs on Admission:  Basic Metabolic Panel:  Lab [id number] 1901  NA 136  K 2.8*  CL 98  CO2 30  GLUCOSE 123*  BUN 11  CREATININE 0.82  CALCIUM 9.5  MG --  PHOS --   CBC:  Lab 06/20/12 1901  WBC 8.7  NEUTROABS --  HGB 12.8  HCT 38.3  MCV 89.7  PLT 255    Radiological Exams on Admission:  Dg Chest 2 View 06/20/2012    IMPRESSION:   1.  Cardiac enlargement.  2.  No acute findings.    Ct Angio Chest W/cm &/or Wo Cm 06/21/2012    IMPRESSION:  Suboptimal contrast bolus timing.  No main or lobar branch pulmonary arterial filling defect identified.  Mild areas of ground-glass opacity may reflect atypical infection or edema.  Mildly prominent mediastinal and hilar lymph nodes, may be reactive however can also be seen with other conditions such as sarcoidosis given the concurrent bronchovascular bundle thickening.  Correlate with clinical presentation, risk factors, and consider short-term follow-up.  Small hiatal hernia.  Distal esophageal wall thickening is nonspecific however can be seen with esophagitis or gastroesophageal reflux disease.  6 mm right lower lobe nodule. If the patient is at high risk for bronchogenic carcinoma, follow-up chest CT at 6-12 months is recommended.  If the patient is at low risk for bronchogenic carcinoma, follow-up chest CT at 12 months  is recommended.    EKG: tachycardia, no acute ST/T wave changes  Assessment/Plan  Chest pain - unclear etiology at this time - will admit the patient to telemetry floor for further evaluation and management - will continue to cycle cardiac enzymes, check TSH - obtain EKG in AM - provide supportive care with analgesia and antiemetics as needed  Hypokalemia - unclear etiology - will check Mg level and will supplement as indicated   6 mm right lower lobe nodule - will discuss with pt and will recommend follow up with CT in 3 months  Code Status: Full Family Communication: Pt and family at bedside Disposition Plan: Home when stable  [name], MD  [facility] Pager [phone]  If 7PM-7AM, please contact night-coverage [email or web address] Password [facility] 06/21/2012, 7:44 AM

## 2012-06-21 NOTE — ED Notes (Signed)
IV hydration in Progress . Pt c/o mild increase SOB.  Monitor with increased tachypnea 33-38.  No change in chest pain remains 4/10 soreness.

## 2012-06-22 DIAGNOSIS — R072 Precordial pain: Secondary | ICD-10-CM

## 2012-06-22 LAB — CBC
HCT: 33.1 % — ABNORMAL LOW (ref 36.0–46.0)
Hemoglobin: 10.6 g/dL — ABNORMAL LOW (ref 12.0–15.0)
MCH: 28.7 pg (ref 26.0–34.0)
MCHC: 32 g/dL (ref 30.0–36.0)
MCV: 89.7 fL (ref 78.0–100.0)
Platelets: 205 10*3/uL (ref 150–400)
RBC: 3.69 MIL/uL — ABNORMAL LOW (ref 3.87–5.11)
RDW: 14.4 % (ref 11.5–15.5)
WBC: 4.5 10*3/uL (ref 4.0–10.5)

## 2012-06-22 LAB — BASIC METABOLIC PANEL
BUN: 8 mg/dL (ref 6–23)
CO2: 25 mEq/L (ref 19–32)
Calcium: 8.6 mg/dL (ref 8.4–10.5)
Chloride: 102 mEq/L (ref 96–112)
Creatinine, Ser: 0.68 mg/dL (ref 0.50–1.10)
GFR calc Af Amer: >90 mL/min (ref >90)
GFR calc non Af Amer: >90 mL/min (ref >90)
Glucose, Bld: 88 mg/dL (ref 70–99)
Potassium: 3.5 mEq/L (ref 3.5–5.1)
Sodium: 138 mEq/L (ref 135–145)

## 2012-06-22 MED ORDER — DOXYCYCLINE HYCLATE 100 MG PO TABS
100.0000 mg | ORAL_TABLET | Freq: Two times a day (BID) | ORAL | Status: DC
  Administered 2012-06-22 – 2012-06-23 (×3): 100 mg via ORAL
  Filled 2012-06-22 (×4): qty 1

## 2012-06-22 MED ORDER — FUROSEMIDE 10 MG/ML IJ SOLN
20.0000 mg | Freq: Once | INTRAMUSCULAR | Status: AC
  Administered 2012-06-22: 20 mg via INTRAVENOUS
  Filled 2012-06-22: qty 2

## 2012-06-22 NOTE — Progress Notes (Signed)
Patient ID: Kari [name], female   DOB: 1961-05-24, 51 y.o.   MRN: [id number]  [facility] PROGRESS NOTE  Kari [name] [id number] DOB: 10/13/1961 DOA: 06/20/2012 PCP: No primary provider on file.   HPI/Subjective: No events overnight.   Objective: Filed Vitals:   06/21/12 1400 06/21/12 2100 06/21/12 2303 06/22/12 0500  BP: 108/73 158/106 129/90 106/69  Pulse: 75 87 85 92  Temp: 97.7 F (36.5 C) 98.9 F (37.2 C)  98.9 F (37.2 C)  TempSrc: Oral     Resp: 20 22  20   Height:      Weight:      SpO2: 93% 98%  93%    Intake/Output Summary (Last 24 hours) at 06/22/12 1315 Last data filed at 06/22/12 0500  Gross per 24 hour  Intake    600 ml  Output      0 ml  Net    600 ml    Exam:   General:  Pt is alert, follows commands appropriately, not in acute distress  Cardiovascular: Regular rate and rhythm, S1/S2, no murmurs, no rubs, no gallops  Respiratory: Clear to auscultation bilaterally but slightly diminished sounds at bases, no wheezing, no crackles, no rhonchi  Abdomen: Soft, non tender, non distended, bowel sounds present, no guarding  Extremities: No edema, pulses DP and PT palpable bilaterally  Neuro: Grossly nonfocal  Data Reviewed: Basic Metabolic Panel:  Lab [id number] 0510 06/21/12 0950 06/20/12 1901  NA 138 138 136  K 3.5 3.2* 2.8*  CL 102 101 98  CO2 25 27 30   GLUCOSE 88 136* 123*  BUN 8 11 11   CREATININE 0.68 0.82 0.82  CALCIUM 8.6 8.8 9.5  MG -- 1.8 --  PHOS -- 3.1 --   CBC:  Lab 06/22/12 0510 06/20/12 1901  WBC 4.5 8.7  NEUTROABS -- --  HGB 10.6* 12.8  HCT 33.1* 38.3  MCV 89.7 89.7  PLT 205 255   Cardiac Enzymes:  Lab 06/21/12 2310 06/21/12 1400 06/21/12 0950  CKTOTAL 133 133 133  CKMB 2.0 2.2 2.0  CKMBINDEX -- -- --  TROPONINI <0.30 <0.30 <0.30    Studies: Dg Chest 2 View  06/20/2012  *RADIOLOGY REPORT*  Clinical Data: Chest pain  CHEST - 2 VIEW  Comparison: 05/13/2006  Findings: Heart size appears enlarged.   No pleural effusion or edema.  No airspace consolidation.  Prominence of the pulmonary arteries is noted which may be seen with PA hypertension.  IMPRESSION:  1.  Cardiac enlargement. 2.  No acute findings.  Original Report Authenticated By: Angelita Ingles, M.D.   Ct Angio Chest W/cm &/or Wo Cm  06/21/2012  *RADIOLOGY REPORT*  Clinical Data: Tachypnea, shortness of breath, chest pain.  CT ANGIOGRAPHY CHEST  Technique:  Multidetector CT imaging of the chest using the standard protocol during bolus administration of intravenous contrast. Multiplanar reconstructed images including MIPs were obtained and reviewed to evaluate the vascular anatomy.  Contrast: 162mL OMNIPAQUE IOHEXOL 350 MG/ML SOLN  Comparison: 06/20/2012 radiograph  Findings: Suboptimal contrast bolus.  No main branch or lobar filling defect identified. The more peripheral branches are poorly opacified.  Cardiomegaly.  No pleural or pericardial effusion.  There are prominent mediastinal and hilar lymph nodes.  As index, a prevascular lymph node measures 1.2 cm short access.  Small hiatal hernia.  Degraded by respiratory motion.  Central airways are patent. Increased bronchovascular bundle thickening.  Linear lung base opacities.  Right lower lung nodule measures 6 mm. Question areas of ground-glass opacity  within the left lower lobe and questionable 6 mm nodule.  Limited images through the upper abdomen show no acute abnormality.  No acute osseous finding.  IMPRESSION: Suboptimal contrast bolus timing.  No main or lobar branch pulmonary arterial filling defect identified.  Mild areas of ground-glass opacity may reflect atypical infection or edema.  Mildly prominent mediastinal and hilar lymph nodes, may be reactive however can also be seen with other conditions such as sarcoidosis given the concurrent bronchovascular bundle thickening.  Correlate with clinical presentation, risk factors, and consider short-term follow-up.  Small hiatal hernia.   Distal esophageal wall thickening is nonspecific however can be seen with esophagitis or gastroesophageal reflux disease.  6 mm right lower lobe nodule. If the patient is at high risk for bronchogenic carcinoma, follow-up chest CT at 6-12 months is recommended.  If the patient is at low risk for bronchogenic carcinoma, follow-up chest CT at 12 months is recommended.  This recommendation follows the consensus statement: Guidelines for Management of Small Pulmonary Nodules Detected on CT Scans: A Statement from the [employer] as published in Radiology 2005; 237:395-400.  Original Report Authenticated By: Suanne Marker, M.D.    Scheduled Meds:   . aspirin EC  325 mg Oral Daily  . doxycycline  100 mg Oral Q12H  . furosemide  20 mg Intravenous Once  . heparin  5,000 Units Subcutaneous Q8H  . hydrochlorothiazide  12.5 mg Oral Daily  . losartan  100 mg Oral Daily  . pneumococcal 23 valent vaccine  0.5 mL Intramuscular Tomorrow-1000  . sodium chloride  3 mL Intravenous Q12H   Continuous Infusions:   . DISCONTD: sodium chloride 75 mL/hr at 06/21/12 1117     Assessment/Plan:  Chest pain  - unclear etiology at this time  - no events on telemetry overnight - CE x 3 neagtive - provide supportive care with analgesia and antiemetics as needed   Hypokalemia  - unclear etiology  - now resolved and within normal limits  6 mm right lower lobe nodule  - will discuss with pt and will recommend follow up with CT in 3 months   Anemia - drop in Hg overnight - no active bleed noted per pt - CBC in AM  Code Status: Full  Family Communication: Pt and family at bedside  Disposition Plan: Home when stable   [name], MD  [facility] Pager [phone]  If 7PM-7AM, please contact night-coverage [email or web address] Password [email or web address] 06/22/2012, 1:15 PM   LOS: 2 days

## 2012-06-22 NOTE — ED Provider Notes (Signed)
I saw and evaluated the patient, reviewed the resident's note and I agree with the findings and plan.   I have seen the patient multiple times in the ED. She remains tachypneic- 30s-40s at times.  Pt febrile. CXR without acute infiltrate.  Worked up for infectious etiology and negative- pt to have CT angio of chest to r/o PE.  May need admission if abnormal vital signs continue after afebrile.   Date: 06/22/2012  Rate: 122  Rhythm: sinus tachycardia with PVC  QRS Axis: normal  Intervals: normal  ST/T Wave abnormalities: nonspecific ST/T changes  Conduction Disutrbances:none  Narrative Interpretation: q waves inferiorly  Old EKG Reviewed: none available   Threasa Beards, MD 06/22/12 1705

## 2012-06-23 DIAGNOSIS — I472 Ventricular tachycardia: Secondary | ICD-10-CM

## 2012-06-23 DIAGNOSIS — R079 Chest pain, unspecified: Secondary | ICD-10-CM

## 2012-06-23 DIAGNOSIS — R509 Fever, unspecified: Secondary | ICD-10-CM

## 2012-06-23 DIAGNOSIS — B9789 Other viral agents as the cause of diseases classified elsewhere: Secondary | ICD-10-CM

## 2012-06-23 LAB — CBC
HCT: 34.7 % — ABNORMAL LOW (ref 36.0–46.0)
Hemoglobin: 11.3 g/dL — ABNORMAL LOW (ref 12.0–15.0)
MCH: 29.2 pg (ref 26.0–34.0)
MCHC: 32.6 g/dL (ref 30.0–36.0)
MCV: 89.7 fL (ref 78.0–100.0)
Platelets: 248 10*3/uL (ref 150–400)
RBC: 3.87 MIL/uL (ref 3.87–5.11)
RDW: 14.2 % (ref 11.5–15.5)
WBC: 4.5 10*3/uL (ref 4.0–10.5)

## 2012-06-23 LAB — GLUCOSE, CAPILLARY
Glucose-Capillary: 88 mg/dL (ref 70–99)
Glucose-Capillary: 102 mg/dL — ABNORMAL HIGH (ref 70–99)

## 2012-06-23 LAB — BASIC METABOLIC PANEL
BUN: 10 mg/dL (ref 6–23)
CO2: 27 mEq/L (ref 19–32)
Calcium: 9 mg/dL (ref 8.4–10.5)
Chloride: 100 mEq/L (ref 96–112)
Creatinine, Ser: 0.76 mg/dL (ref 0.50–1.10)
GFR calc Af Amer: >90 mL/min (ref >90)
GFR calc non Af Amer: >90 mL/min (ref >90)
Glucose, Bld: 92 mg/dL (ref 70–99)
Potassium: 3.4 mEq/L — ABNORMAL LOW (ref 3.5–5.1)
Sodium: 137 mEq/L (ref 135–145)

## 2012-06-23 MED ORDER — VANCOMYCIN HCL 1000 MG IV SOLR
2000.0000 mg | INTRAVENOUS | Status: DC
  Administered 2012-06-23 – 2012-06-24 (×2): 2000 mg via INTRAVENOUS
  Filled 2012-06-23 (×4): qty 2000

## 2012-06-23 MED ORDER — FUROSEMIDE 10 MG/ML IJ SOLN
20.0000 mg | Freq: Every day | INTRAMUSCULAR | Status: DC
  Administered 2012-06-23 – 2012-06-24 (×2): 20 mg via INTRAVENOUS
  Filled 2012-06-23 (×4): qty 2

## 2012-06-23 MED ORDER — POTASSIUM CHLORIDE CRYS ER 20 MEQ PO TBCR
40.0000 meq | EXTENDED_RELEASE_TABLET | Freq: Once | ORAL | Status: AC
  Administered 2012-06-23: 40 meq via ORAL
  Filled 2012-06-23: qty 2

## 2012-06-23 NOTE — Progress Notes (Signed)
ANTIBIOTIC CONSULT NOTE - INITIAL  Pharmacy Consult for Vancomycin  Indication: Cellulitis  No Known Allergies  Patient Measurements: Height: 5\' 6"  (167.6 cm) Weight: 327 lb (148.326 kg) IBW/kg (Calculated) : 59.3  Adjusted Body Weight: 95.2 kg  Vital Signs: Temp: 98.2 F (36.8 C) (07/01 0947) BP: 115/77 mmHg (07/01 0947) Pulse Rate: 78  (07/01 0947) Intake/Output from previous day: 06/30 0701 - 07/01 0700 In: 600 [P.O.:600] Out: 1600 [Urine:1600] Intake/Output from this shift: Total I/O In: 360 [P.O.:360] Out: -   Labs:  Basename 06/23/12 0635 06/22/12 0510 06/21/12 0950 06/20/12 1901  WBC 4.5 4.5 -- 8.7  HGB 11.3* 10.6* -- 12.8  PLT 248 205 -- 255  LABCREA -- -- -- --  CREATININE 0.76 0.68 0.82 --   Estimated Creatinine Clearance: 126 ml/min (by C-G formula based on Cr of 0.76).  Microbiology: No results found for this or any previous visit (from the past 720 hour(s)).  Medical History: Past Medical History  Diagnosis Date  . Hypertension    Morbid Obesity  Medications:  Anti-infectives     Start     Dose/Rate Route Frequency Ordered Stop   06/23/12 1400   vancomycin (VANCOCIN) 2,000 mg in sodium chloride 0.9 % 500 mL IVPB        2,000 mg 250 mL/hr over 120 Minutes Intravenous Every 24 hours 06/23/12 1258     06/22/12 1330   doxycycline (VIBRA-TABS) tablet 100 mg  Status:  Discontinued        100 mg Oral Every 12 hours 06/22/12 1237 06/23/12 1204         Assessment: 51 yo female admitted with c/o chest pain.  She has some leg swelling and noted cellulitis.  She is morbidly obese with a TBW = 148 kg.  She has normal renal function with Creat. = 0.76 and an estimated clearance of 90 ml/min.  She had fever on admit but is currently afebrile, WBC wnl and no culture data.  She has been on Doxycycline which has now been discontinued.  Goal of Therapy:  Vancomycin trough level 10-15 mcg/ml  Plan:   Vancomycin 2gm. IV every 24 hours  Monitor renal  function to determine appropriate clearance  Evaluate s/s levels if she is to continue > 96 hours.  Rober Minion, PharmD., MS Clinical Pharmacist Pager:  [phone]  Thank you for allowing pharmacy to be part of this patients care team. 06/23/2012,1:00 PM

## 2012-06-23 NOTE — Consult Note (Signed)
WOC consult Note Reason for Consult: eval wounds of the left lower leg.  Pt reports "bumping leg on bed at home" several weeks ago. She now presents with open draining wound and edema consistent with venous stasis.  She reports she has chronic swelling of her legs and has been instructed in the past to wear compression hose, however she was not able to afford them.  Wound type: Venous stasis x 2 Measurement: 3cm x 3cm x 0.2cm and 1.0cm x 1.0cm x 0.2cm  Wound bed:100% yellow slough Drainage (amount, consistency, odor) heavy, serous with no odor Periwound:intact but slightly macerated Dressing procedure/placement/frequency:will order silver hydrofiber to absorb exudate, clean up wound bed and promote wound healing however will need compression as gold standard of care to heal these wounds.  She will need ABI prior to compression application, I have requested MD to order.  I have explained rationale for dressings ordered and need for compression to the pt.as well.   WOC will follow up after ABI's obtained and place in compression if deemed safe after test.  [place], [place]

## 2012-06-23 NOTE — Progress Notes (Signed)
Patient ID: Kari [name], female   DOB: 1961/03/24, 51 y.o.   MRN: [id number]  [facility] PROGRESS NOTE  Kari [name] [id number] DOB: 27-Dec-1960 DOA: 06/20/2012 PCP: No primary provider on file.  Brief narrative: Pt is 51 yo female who was initially admitted on 06/21/2012 for evaluation of substernal chest pain, subsequently ACS was ruled out by negative CE's and no acute findings on EKG. Chest pain has resolved at this point but pt has developed progressively worsening lower extremity swelling, warmth to touch, and pain to palpation. She has progressive chronic venous stasis changes and now concern for bilateral lower extremity cellulitis.  Consultants:  None  Procedures:  None  Antibiotics:  Doxycycline 06/21/2012 --> 06/23/2012  Vancomycin 06/23/2012 -->  HPI/Subjective: Pt reports subjective fever, progressive lower extremity swelling and warmth to touch.  Objective: Filed Vitals:   06/23/12 0500 06/23/12 0947 06/23/12 1437 06/23/12 1751  BP: 124/80 115/77 123/84 125/90  Pulse: 80 78 73 81  Temp: 98.8 F (37.1 C) 98.2 F (36.8 C) 98.5 F (36.9 C)   TempSrc:   Oral   Resp: 18 18 20 20   Height:      Weight:      SpO2: 93% 99% 95% 97%    Intake/Output Summary (Last 24 hours) at 06/23/12 1935 Last data filed at 06/23/12 1300  Gross per 24 hour  Intake    600 ml  Output   1200 ml  Net   -600 ml    Exam:   General:  Pt is alert, follows commands appropriately, not in acute distress  Cardiovascular: Regular rate and rhythm, S1/S2, no murmurs, no rubs, no gallops  Respiratory: Clear to auscultation bilaterally but diminished breath sounds at  bases, no wheezing, no crackles, no rhonchi  Abdomen: Soft, non tender, non distended, bowel sounds present, no guarding  Extremities: Bilateral + 1 pitting edema, chronic venous stasis changes, warmth and tenderness to palpation extending to mid calf, left lower ext venous stasis ulcer  Neuro:  Grossly nonfocal  Data Reviewed: Basic Metabolic Panel:  Lab [id number] 0635 06/22/12 0510 06/21/12 0950 06/20/12 1901  NA 137 138 138 136  K 3.4* 3.5 3.2* 2.8*  CL 100 102 101 98  CO2 27 25 27 30   GLUCOSE 92 88 136* 123*  BUN 10 8 11 11   CREATININE 0.76 0.68 0.82 0.82  CALCIUM 9.0 8.6 8.8 9.5  MG -- -- 1.8 --  PHOS -- -- 3.1 --   CBC:  Lab 06/23/12 0635 06/22/12 0510 06/20/12 1901  WBC 4.5 4.5 8.7  NEUTROABS -- -- --  HGB 11.3* 10.6* 12.8  HCT 34.7* 33.1* 38.3  MCV 89.7 89.7 89.7  PLT 248 205 255   Cardiac Enzymes:  Lab 06/21/12 2310 06/21/12 1400 06/21/12 0950  CKTOTAL 133 133 133  CKMB 2.0 2.2 2.0  CKMBINDEX -- -- --  TROPONINI <0.30 <0.30 <0.30   CBG:  Lab 06/23/12 1645 06/23/12 1127  GLUCAP 102* 88    Studies: No results found.  Scheduled Meds:   . aspirin EC  325 mg Oral Daily  . furosemide  20 mg Intravenous Daily  . heparin  5,000 Units Subcutaneous Q8H  . hydrochlorothiazide  12.5 mg Oral Daily  . losartan  100 mg Oral Daily  . potassium chloride  40 mEq Oral Once  . sodium chloride  3 mL Intravenous Q12H  . vancomycin  2,000 mg Intravenous Q24H  . DISCONTD: doxycycline  100 mg Oral Q12H   Continuous Infusions:  Assessment/Plan:  Chest pain  - unclear etiology but perhaps musculoskeletal in etiology - chest pain has now resolved - no events on telemetry overnight  - CE x 3 neagtive  - provide supportive care with analgesia and antiemetics as needed   Hypokalemia  - unclear etiology  - now resolved and within normal limits  - will continue to monitor and supplement as indicated - Mg level is within normal limits  6 mm right lower lobe nodule  - pt is non smoker - discussed with pt recommendation on follow up with CT scan in 6 months  Anemia  - no active bleed noted per pt  - Hg and Hct remain stable and at pt's baseline - CBC in AM  Bilateral lower extremity swelling - consistent with cellulitis - will extend the coverage to  Vancomycin for now - follow up on wound care recommendations - will order ABI  HTN - controlled, will continue HCTZ and Lasix  Code Status: Full  Family Communication: Pt and family at bedside  Disposition Plan: Home when stable   [name], MD  [facility] Pager [phone]  If 7PM-7AM, please contact night-coverage [email or web address] Password [email or web address] 06/23/2012, 7:35 PM   LOS: 3 days

## 2012-06-24 LAB — BASIC METABOLIC PANEL
BUN: 12 mg/dL (ref 6–23)
CO2: 29 mEq/L (ref 19–32)
Calcium: 9.3 mg/dL (ref 8.4–10.5)
Chloride: 101 mEq/L (ref 96–112)
Creatinine, Ser: 0.84 mg/dL (ref 0.50–1.10)
GFR calc Af Amer: >90 mL/min (ref >90)
GFR calc non Af Amer: 80 mL/min — ABNORMAL LOW (ref >90)
Glucose, Bld: 92 mg/dL (ref 70–99)
Potassium: 3.8 mEq/L (ref 3.5–5.1)
Sodium: 137 mEq/L (ref 135–145)

## 2012-06-24 LAB — CBC
HCT: 34.3 % — ABNORMAL LOW (ref 36.0–46.0)
Hemoglobin: 11.2 g/dL — ABNORMAL LOW (ref 12.0–15.0)
MCH: 29.2 pg (ref 26.0–34.0)
MCHC: 32.7 g/dL (ref 30.0–36.0)
MCV: 89.6 fL (ref 78.0–100.0)
Platelets: 218 10*3/uL (ref 150–400)
RBC: 3.83 MIL/uL — ABNORMAL LOW (ref 3.87–5.11)
RDW: 14.1 % (ref 11.5–15.5)
WBC: 5 10*3/uL (ref 4.0–10.5)

## 2012-06-24 LAB — GLUCOSE, CAPILLARY: Glucose-Capillary: 96 mg/dL (ref 70–99)

## 2012-06-24 MED ORDER — LOSARTAN POTASSIUM 50 MG PO TABS
50.0000 mg | ORAL_TABLET | Freq: Every day | ORAL | Status: DC
  Administered 2012-06-24 – 2012-06-25 (×2): 50 mg via ORAL
  Filled 2012-06-24 (×2): qty 1

## 2012-06-24 NOTE — Progress Notes (Signed)
Patient ID: Kari [name], female   DOB: Nov 20, 1961, 51 y.o.   MRN: [id number]  [facility] PROGRESS NOTE  Kari [name] [id number] DOB: 12/14/1961 DOA: 06/20/2012 PCP: No primary provider on file.  Brief narrative:  Pt is 50 yo female who was initially admitted on 06/21/2012 for evaluation of substernal chest pain, subsequently ACS was ruled out by negative CE's and no acute findings on EKG. Chest pain has resolved at this point but pt has developed progressively worsening lower extremity swelling, warmth to touch, and pain to palpation. She has progressive chronic venous stasis changes and now concern for bilateral lower extremity cellulitis.   Consultants:  None Procedures:  None Antibiotics:  Doxycycline 06/21/2012 --> 06/23/2012  Vancomycin 06/23/2012 -->  HPI/Subjective: No events overnight. Pt reports swelling in lower extremities now improved and she denies any chest pain.   Objective: Filed Vitals:   06/24/12 0758 06/24/12 0947 06/24/12 0948 06/24/12 1300  BP: 133/90 122/75 114/76 97/55  Pulse: 85   72  Temp:    98 F (36.7 C)  TempSrc:      Resp:    16  Height:      Weight:      SpO2: 95%   100%    Intake/Output Summary (Last 24 hours) at 06/24/12 1733 Last data filed at 06/23/12 2224  Gross per 24 hour  Intake      3 ml  Output      0 ml  Net      3 ml    Exam:  General: Pt is alert, follows commands appropriately, not in acute distress  Cardiovascular: Regular rate and rhythm, S1/S2, no murmurs, no rubs, no gallops  Respiratory: Clear to auscultation bilaterally but diminished breath sounds at bases, no wheezing, no crackles, no rhonchi  Abdomen: Soft, non tender, non distended, bowel sounds present, no guarding  Extremities: Bilateral + 1 pitting edema, chronic venous stasis changes, no warmth or tenderness to palpation, left lower ext venous stasis ulcer healing Neuro: Grossly nonfocal  Data Reviewed: Basic Metabolic Panel:  Lab  [id number] 06/23/12 0635 06/22/12 0510 06/21/12 0950 06/20/12 1901  NA 137 137 138 138 136  K 3.8 3.4* 3.5 3.2* 2.8*  CL 101 100 102 101 98  CO2 29 27 25 27 30   GLUCOSE 92 92 88 136* 123*  BUN 12 10 8 11 11   CREATININE 0.84 0.76 0.68 0.82 0.82  CALCIUM 9.3 9.0 8.6 8.8 9.5  MG -- -- -- 1.8 --  PHOS -- -- -- 3.1 --   CBC:  Lab 06/24/12 0545 06/23/12 0635 06/22/12 0510 06/20/12 1901  WBC 5.0 4.5 4.5 8.7  NEUTROABS -- -- -- --  HGB 11.2* 11.3* 10.6* 12.8  HCT 34.3* 34.7* 33.1* 38.3  MCV 89.6 89.7 89.7 89.7  PLT 218 248 205 255   Cardiac Enzymes:  Lab 06/21/12 2310 06/21/12 1400 06/21/12 0950  CKTOTAL 133 133 133  CKMB 2.0 2.2 2.0  CKMBINDEX -- -- --  TROPONINI <0.30 <0.30 <0.30   CBG:  Lab 06/24/12 0752 06/23/12 1645 06/23/12 1127  GLUCAP 96 102* 88    Studies: No results found.  Scheduled Meds:   . aspirin EC  325 mg Oral Daily  . furosemide  20 mg Intravenous Daily  . heparin  5,000 Units Subcutaneous Q8H  . losartan  50 mg Oral Daily  . sodium chloride  3 mL Intravenous Q12H  . vancomycin  2,000 mg Intravenous Q24H  . DISCONTD: hydrochlorothiazide  12.5  mg Oral Daily  . DISCONTD: losartan  100 mg Oral Daily   Continuous Infusions:    Assessment/Plan:   Chest pain  - unclear etiology but perhaps musculoskeletal in etiology  - chest pain has now resolved  - no events on telemetry overnight  - CE x 3 neagtive  - provide supportive care with analgesia and antiemetics as needed   Hypokalemia  - unclear etiology  - now resolved and within normal limits  - will continue to monitor and supplement as indicated  - Mg level is within normal limits   6 mm right lower lobe nodule  - pt is non smoker  - discussed with pt recommendation on follow up with CT scan in 6 months  - per PCCM recommendation, will need to schedule an appointment for follow up  Anemia  - no active bleed noted per pt  - Hg and Hct remain stable and at pt's baseline  - CBC in  AM  Bilateral lower extremity swelling  - consistent with cellulitis, now clinically improving - will likely be able to switch to PO antibiotics in AM - follow up on wound care recommendations  - ABI to be done today  HTN  - controlled, will continue HCTZ and Lasix   Code Status: Full  Family Communication: Pt and family at bedside  Disposition Plan: Home when stable in 1-2 days  [name], MD  [facility] Pager [phone]  If 7PM-7AM, please contact night-coverage [email or web address] Password [email or web address] 06/24/2012, 5:33 PM   LOS: 4 days

## 2012-06-25 LAB — GLUCOSE, CAPILLARY: Glucose-Capillary: 134 mg/dL — ABNORMAL HIGH (ref 70–99)

## 2012-06-25 MED ORDER — DOXYCYCLINE HYCLATE 100 MG PO TABS
100.0000 mg | ORAL_TABLET | Freq: Two times a day (BID) | ORAL | Status: DC
  Administered 2012-06-25: 100 mg via ORAL
  Filled 2012-06-25 (×2): qty 1

## 2012-06-25 MED ORDER — DOXYCYCLINE HYCLATE 100 MG PO TABS: 100.0000 mg | ORAL_TABLET | Freq: Two times a day (BID) | ORAL | Status: AC

## 2012-06-25 MED ORDER — BACITRACIN-NEOMYCIN-POLYMYXIN OINTMENT TUBE: 1.0000 "application " | TOPICAL_OINTMENT | Freq: Four times a day (QID) | CUTANEOUS | Status: DC | PRN

## 2012-06-25 NOTE — Care Management Note (Signed)
    Page 1 of 1   06/25/2012     9:47:26 AM   CARE MANAGEMENT NOTE 06/25/2012  Patient:  Kari [name], Kari [name]   Account Number:  [id number]  Date Initiated:  06/25/2012  Documentation initiated by:  GRAVES-BIGELOW,Montrice Montuori  Subjective/Objective Assessment:   Pt admitted with Atypical noncardiac chest pain, bilateral lower extremity edema, lung nodule, venous stasis left leg ulcer. Plan for d/c today. Pt is from home with daughter.     Action/Plan:   Pt is not home bound for RN services at this time. Pt is saying she will not need services due to daughter will be able to change dressing.  RN will teach pt how to do dressing change. No further services from CM at this time.   Anticipated DC Date:  06/25/2012   Anticipated DC Plan:  [place] / [facility] (established/new)  CM consult      Choice offered to / List presented to:             Status of service:  Completed, signed off Medicare Important Message given?   (If response is "NO", the following Medicare IM given date fields will be blank) Date Medicare IM given:   Date Additional Medicare IM given:    Discharge Disposition:  HOME/SELF CARE  Per UR Regulation:  Reviewed for med. necessity/level of care/duration of stay  If discussed at [place] of Stay Meetings, dates discussed:    Comments:  Pt did state that she was referred to [facility]. Not sure who referred pt. However person from [facility] had given her a card that her daughter has.

## 2012-06-25 NOTE — Consult Note (Signed)
WOC follow up  Pt with venous stasis ulcerations and suspected associated cellulitis of the right LE.  I have been utilizing silver hydrofiber covered with silicone foam dressing to maximize absorption of the exudate and tx bioburdan of wound bed. She will be discharged today and did not have ABI's prior to discharge. Would recommend follow up for outpt testing to eval for compression therapy. [facility] can provide follow up for this care and order further wound care and compression if indicated.  Wound care recommendations for home: remove old dressings, shower or cleanse wounds with water. Cover with silver hydrofiber dressing cut to fit into wound bed cover with adherent silicone foam dressings. Change every other day and PRN soilage.   [place], [place]

## 2012-06-25 NOTE — Discharge Summary (Signed)
Kari [name], is a 51 y.o. female  DOB 05-Dec-1961  MRN [id number].  Admission date:  06/20/2012  Discharge Date:  06/25/2012  Primary MD  No primary provider on file.  Admitting Physician  Theodis Blaze, MD  Admission Diagnosis  Ventricular tachycardia [427.1] Viral syndrome [079.99] Fever [780.60] Chest pain [786.50] Ventricular tachycardia   Discharge Diagnosis     Atypical noncardiac chest pain, bilateral lower extremity edema, lung nodule, venous stasis left leg ulcer  Past Medical History  Diagnosis Date  . Hypertension     History reviewed. No pertinent past surgical history.   Hospital Course See H&P, Labs, Consult and Test reports for all details in brief, patient was admitted for  atypical chest pain patient now says that she did eat some food at home causing some nausea  on the day of admission, chest pain was atypical, EKG unchanged x2, cardiac enzymes negative x3, CT chest showed possible atypical infection along with lung-nodule which needs to be followed up as outpatient. Chest pain likely due to atypical infection causing cough, pastient does have some cough and pain appears to be musculoskeletal due to cough. He was stable on telemetry, currently chest pain-free, she will be placed on doxycycline with outpatient followup with primary care physician.   For her left leg wound which has been there for the last few weeks she will get wound care, oral antibiotics as she has mild surrounding cellulitis, I will request her to follow with vascular surgery as an outpatient as there is element of venous stasis involved in her ulceration process.   Patient was also found to have a lung nodule which should be followed up by pulmonologist. Patient has been requested to follow with him in the next 1-2 weeks, patient will likely need a repeat CT scan of her chest in  3-6 months.   If her chest pain reoccurs one time outpatient cardiology followup will be prudent patient has been suggested to follow with her cardiologist in one to 2 weeks if she desires.   Consults  Wound care  Significant Tests:  See full reports for all details     Dg Chest 2 View  06/20/2012  *RADIOLOGY REPORT*  Clinical Data: Chest pain  CHEST - 2 VIEW  Comparison: 05/13/2006  Findings: Heart size appears enlarged.  No pleural effusion or edema.  No airspace consolidation.  Prominence of the pulmonary arteries is noted which may be seen with PA hypertension.  IMPRESSION:  1.  Cardiac enlargement. 2.  No acute findings.  Original Report Authenticated By: Angelita Ingles, M.D.   Ct Angio Chest W/cm &/or Wo Cm  06/21/2012  *RADIOLOGY REPORT*  Clinical Data: Tachypnea, shortness of breath, chest pain.  CT ANGIOGRAPHY CHEST  Technique:  Multidetector CT imaging of the chest using the standard protocol during bolus administration of intravenous contrast. Multiplanar reconstructed images including MIPs were obtained and reviewed to evaluate the vascular anatomy.  Contrast: 118mL OMNIPAQUE IOHEXOL 350 MG/ML SOLN  Comparison: 06/20/2012 radiograph  Findings: Suboptimal contrast bolus.  No main branch or lobar filling defect identified. The more peripheral branches are poorly opacified.  Cardiomegaly.  No pleural or pericardial effusion.  There are prominent mediastinal and hilar lymph nodes.  As index, a prevascular lymph node measures 1.2 cm short access.  Small hiatal hernia.  Degraded by respiratory motion.  Central airways are patent. Increased bronchovascular bundle thickening.  Linear lung base opacities.  Right lower lung nodule measures 6 mm. Question areas of ground-glass opacity within the left lower lobe and questionable 6 mm nodule.  Limited images through the upper abdomen show no acute abnormality.  No acute osseous finding.  IMPRESSION: Suboptimal contrast bolus timing.  No main or lobar  branch pulmonary arterial filling defect identified.  Mild areas of ground-glass opacity may reflect atypical infection or edema.  Mildly prominent mediastinal and hilar lymph nodes, may be reactive however can also be seen with other conditions such as sarcoidosis given the concurrent bronchovascular bundle thickening.  Correlate with clinical presentation, risk factors, and consider short-term follow-up.  Small hiatal hernia.  Distal esophageal wall thickening is nonspecific however can be seen with esophagitis or gastroesophageal reflux disease.  6 mm right lower lobe nodule. If the patient is at high risk for bronchogenic carcinoma, follow-up chest CT at 6-12 months is recommended.  If the patient is at low risk for bronchogenic carcinoma, follow-up chest CT at 12 months is recommended.  This recommendation follows the consensus statement: Guidelines for Management of Small Pulmonary Nodules Detected on CT Scans: A Statement from the [employer] as published in Radiology 2005; 237:395-400.  Original Report Authenticated By: Suanne Marker, M.D.     Today   Subjective:   Kari [name] today has no headache,no chest abdominal pain,no new weakness tingling or numbness, feels much better wants to go home today.     Objective:   Blood pressure 112/70, pulse 81, temperature 98.4 F (36.9 C), temperature source Oral, resp. rate 16, height 5\' 6"  (1.676 m), weight 148.054 kg (326 lb 6.4 oz), last menstrual period 11/21/2011, SpO2 97.00%. No intake or output data in the 24 hours ending 06/25/12 0759  Exam Awake Alert, Oriented *3, No new F.N deficits, Normal affect [place].AT,PERRAL Supple Neck,No JVD, No cervical lymphadenopathy appriciated.  Symmetrical Chest wall movement, Good air movement bilaterally, CTAB RRR,No Gallops,Rubs or new Murmurs, No Parasternal Heave +ve B.Sounds, Abd Soft, Non tender, No organomegaly appriciated, No rebound -guarding or rigidity. No Cyanosis, Clubbing or  edema, No new Rash or bruise, chronic lower extremity edema left more than right with a small left lateral calf area venous ulcer which has been there for a few weeks under dressing with mild surrounding cellulitis.  Data Review     CBC w Diff:  Lab Results  Component Value Date   WBC 5.0 06/24/2012   HGB 11.2* 06/24/2012   HCT 34.3* 06/24/2012   PLT 218 06/24/2012    CMP:  Lab Results  Component Value Date   NA 137 06/24/2012   K 3.8 06/24/2012   CL 101 06/24/2012   CO2 29 06/24/2012   BUN 12 06/24/2012   CREATININE 0.84 06/24/2012  .   Discharge Instructions    Follow with Primary MD  in 3 days   Get CBC, CMP, checked 3 days by Primary MD and again as instructed by your Primary MD. Get a 2 view Chest X ray done next visit.  You need to followup here CT scan results with your primary care  physician and the lung doctor suggested.  Get Medicines reviewed and adjusted.  Please request your Prim.MD to go over all Hospital Tests and Procedure/Radiological results at the follow up, please get all Hospital records sent to your Prim MD by signing hospital release before you go home.  Activity: As tolerated with Full fall precautions use walker/cane & assistance as needed  Diet: Heart healthy  Check your Weight same time everyday, if you gain over 2 pounds, or you develop in leg swelling, experience more shortness of breath or chest pain, call your Primary MD immediately. Follow Cardiac Low Salt Diet and 1.8 lit/day fluid restriction.  Disposition Home    If you experience worsening of your admission symptoms, develop shortness of breath, life threatening emergency, suicidal or homicidal thoughts you must seek medical attention immediately by calling 911 or calling your MD immediately  if symptoms less severe.  You Must read complete instructions/literature along with all the possible adverse reactions/side effects for all the Medicines you take and that have been prescribed to you. Take any new  Medicines after you have completely understood and accpet all the possible adverse reactions/side effects.   Do not drive if your were admitted for syncope or siezures until you have seen by Primary MD or a Neurologist and advised to drive.  Do not drive when taking Pain medications.    Do not take more than prescribed Pain, Sleep and Anxiety Medications  Special Instructions: If you have smoked or chewed Tobacco  in the last 2 yrs please stop smoking, stop any regular Alcohol  and or any Recreational drug use.  Wear Seat belts while driving. Follow-up Information    Follow up with Your primary care physician. Schedule an appointment as soon as possible for a visit in 3 days. (You need to followup with your doctor.)       Follow up with [facility], MD. Schedule an appointment as soon as possible for a visit in 1 week.   Contact information:   [place] [phone]       Follow up with EARLY, TODD, MD. Schedule an appointment as soon as possible for a visit in 1 week.   Contact information:   [address] [place] [phone]       Follow up with Laverda Page, MD. Schedule an appointment as soon as possible for a visit in 2 weeks.   Contact information:   [address] [place] [phone]          Discharge Medications    [place], [facility]  Home Medication Instructions [id number]   Printed on:06/25/12 0759  Medication Information                    albuterol (PROVENTIL HFA;VENTOLIN HFA) 108 (90 BASE) MCG/ACT inhaler Inhale 1-2 puffs into the lungs every 6 (six) hours as needed for wheezing.           zolpidem (AMBIEN) 5 MG tablet Take 5 mg by mouth at bedtime as needed. For sleep           losartan-hydrochlorothiazide (HYZAAR) 100-12.5 MG per tablet Take 1 tablet by mouth daily.           ibuprofen (ADVIL,MOTRIN) 200 MG tablet Take 200 mg by  mouth every 6 (six) hours as needed. For pain           doxycycline (VIBRA-TABS) 100 MG tablet Take  1 tablet (100 mg total) by mouth every 12 (twelve) hours.           neomycin-bacitracin-polymyxin (NEOSPORIN) OINT Apply 1 application topically every 6 (six) hours as needed ( apply to L Leg twice a day).              Total Time in preparing paper work, data evaluation and todays exam - 35 minutes  Thurnell Lose M.D on 06/25/2012 at [place]  [phone]

## 2012-06-25 NOTE — Discharge Instructions (Signed)
Follow with Primary MD  in 3 days   Get CBC, CMP, checked 3 days by Primary MD and again as instructed by your Primary MD. Get a 2 view Chest X ray done next visit.  You need to followup here CT scan results with your primary care physician and the lung doctor suggested.  Get Medicines reviewed and adjusted.  Please request your Prim.MD to go over all Hospital Tests and Procedure/Radiological results at the follow up, please get all Hospital records sent to your Prim MD by signing hospital release before you go home.  Activity: As tolerated with Full fall precautions use walker/cane & assistance as needed  Diet: Heart healthy  Check your Weight same time everyday, if you gain over 2 pounds, or you develop in leg swelling, experience more shortness of breath or chest pain, call your Primary MD immediately. Follow Cardiac Low Salt Diet and 1.8 lit/day fluid restriction.  Disposition Home    If you experience worsening of your admission symptoms, develop shortness of breath, life threatening emergency, suicidal or homicidal thoughts you must seek medical attention immediately by calling 911 or calling your MD immediately  if symptoms less severe.  You Must read complete instructions/literature along with all the possible adverse reactions/side effects for all the Medicines you take and that have been prescribed to you. Take any new Medicines after you have completely understood and accpet all the possible adverse reactions/side effects.   Do not drive if your were admitted for syncope or siezures until you have seen by Primary MD or a Neurologist and advised to drive.  Do not drive when taking Pain medications.    Do not take more than prescribed Pain, Sleep and Anxiety Medications  Special Instructions: If you have smoked or chewed Tobacco  in the last 2 yrs please stop smoking, stop any regular Alcohol  and or any Recreational drug use.  Wear Seat belts while driving.

## 2012-07-03 ENCOUNTER — Inpatient Hospital Stay: Payer: Medicaid Other | Admitting: Adult Health

## 2012-07-04 ENCOUNTER — Inpatient Hospital Stay: Payer: Medicaid Other | Admitting: Internal Medicine

## 2012-07-07 ENCOUNTER — Encounter: Payer: Self-pay | Admitting: Vascular Surgery

## 2012-07-08 ENCOUNTER — Encounter: Payer: Self-pay | Admitting: Vascular Surgery

## 2012-07-08 ENCOUNTER — Ambulatory Visit (INDEPENDENT_AMBULATORY_CARE_PROVIDER_SITE_OTHER): Payer: Medicaid Other | Admitting: Vascular Surgery

## 2012-07-08 VITALS — BP 134/75 | HR 74 | Resp 18 | Ht 67.0 in | Wt 319.8 lb

## 2012-07-08 DIAGNOSIS — L97909 Non-pressure chronic ulcer of unspecified part of unspecified lower leg with unspecified severity: Secondary | ICD-10-CM

## 2012-07-08 DIAGNOSIS — I83009 Varicose veins of unspecified lower extremity with ulcer of unspecified site: Secondary | ICD-10-CM

## 2012-07-08 NOTE — Progress Notes (Signed)
The patient presents today for evaluation of venous ulceration left leg. She was admitted to the hospital with chest pain approximately one month ago she does have marked lower extremity edema. Cardiac workup was negative. She was referred to [place] for followup of ulceration of her left posterior calf. She has been treating this with a silver application since discharge. She has not been using compression. She did have a great deal of weeping from the ulcers but not this has slowed over the past week to 2 weeks. He does have a prior history of superficial ulcerations her left posterior calf several years ago these avulse spontaneously healed. She does not have any history of right leg ulcers. She does have history marked swelling in both lower Shoney's from the knees distally. She has no history of DVT. She is morbidly obese weighing approximately 320 pounds 5 foot 7 inches tall  Past Medical History  Diagnosis Date  . Hypertension   . Depression   . Asthma   . Ulcer     left posterior calf    History  Substance Use Topics  . Smoking status: Never Smoker   . Smokeless tobacco: Not on file  . Alcohol Use: No    History reviewed. No pertinent family history.  No Known Allergies  Current outpatient prescriptions:albuterol (PROVENTIL HFA;VENTOLIN HFA) 108 (90 BASE) MCG/ACT inhaler, Inhale 1-2 puffs into the lungs every 6 (six) hours as needed for wheezing., Disp: 1 Inhaler, Rfl: 0;  furosemide (LASIX) 20 MG tablet, Take 20 mg by mouth 2 (two) times daily., Disp: , Rfl: ;  ibuprofen (ADVIL,MOTRIN) 200 MG tablet, Take 200 mg by mouth every 6 (six) hours as needed. For pain, Disp: , Rfl:  lisinopril (PRINIVIL,ZESTRIL) 20 MG tablet, Take 20 mg by mouth daily., Disp: , Rfl: ;  Wound Dressings (MAXORB EXTRA AG+) PADS, Apply topically every other day., Disp: , Rfl: ;  zolpidem (AMBIEN) 5 MG tablet, Take 5 mg by mouth at bedtime as needed. For sleep, Disp: , Rfl: ;  losartan-hydrochlorothiazide (HYZAAR)  100-12.5 MG per tablet, Take 1 tablet by mouth daily., Disp: , Rfl:  neomycin-bacitracin-polymyxin (NEOSPORIN) OINT, Apply 1 application topically every 6 (six) hours as needed ( apply to L Leg twice a day)., Disp: 1 Tube, Rfl: 0  BP 134/75  Pulse 74  Resp 18  Ht 5\' 7"  (1.702 m)  Wt 319 lb 12.8 oz (145.06 kg)  BMI 50.09 kg/m2  LMP 11/21/2011  Body mass index is 50.09 kg/(m^2).       Review of systems positive for swelling in her legs and varicosities Pulmonary is positive for wheezing Psychiatric history of depression Skin no history of ulcers Otherwise negative  Physical exam well-developed developed obese black female in no acute distress Neurologically she is grossly intact Pulse status 2+ radial and dorsalis pedis pulses bilaterally Respirations are nonlabored Skin noted to have ulceration over the posterior aspect of her left calf and ankle. There 2 discreet ulcers one approximately 1/2-2 cm and the other approximately 3-4 cm. There is no surrounding erythema. Both lower Shoney's have thickening with chronic venous hypertension.  Impression and plan: Venous stasis ulceration left posterior calf. I explained the importance of elevation to the patient. Written her a prescription for Silvadene for treatment instruct her on the use of these. We have addressed at her wounds today with Silvadene and applied a 6 inch Ace. I felt that due to her size and the very difficult for her to place a compression garment and  that she would have more success with placement of a 6 inch Ace wrap and she was instructed on how to use this today in our office. We will see her in one month for continued followup

## 2012-07-11 ENCOUNTER — Inpatient Hospital Stay: Payer: Medicaid Other | Admitting: Adult Health

## 2012-07-25 ENCOUNTER — Inpatient Hospital Stay: Payer: Medicaid Other | Admitting: Pulmonary Disease

## 2012-07-25 ENCOUNTER — Telehealth: Payer: Self-pay

## 2012-07-25 NOTE — Telephone Encounter (Signed)
Moved appt to 08/06 10:15 with [facility]- lvm for daughter. dpm

## 2012-07-25 NOTE — Telephone Encounter (Signed)
Pt's. Aunt called to report pt. Has new areas on right leg and requesting to have checked sooner than f/u on 8/13.  States the right leg is somewhat red from the knee and down to foot.  States there is 2 small, darkened areas that were not present when she was eval. Per Dr. Donnetta Hutching in July.  States pt. Has one dark area on outside of ankle, and one on medial aspect of foot; denies that the dark areas are opened-up at this time.  States one of the 2 ulcers on the left leg is improving.  Advised that will notify pt. To schedule earlier appt.

## 2012-07-28 ENCOUNTER — Encounter: Payer: Self-pay | Admitting: Vascular Surgery

## 2012-07-29 ENCOUNTER — Encounter: Payer: Self-pay | Admitting: Vascular Surgery

## 2012-07-29 ENCOUNTER — Ambulatory Visit (INDEPENDENT_AMBULATORY_CARE_PROVIDER_SITE_OTHER): Payer: Medicaid Other | Admitting: Vascular Surgery

## 2012-07-29 VITALS — BP 141/82 | HR 73 | Temp 98.2°F | Ht 69.0 in | Wt 317.0 lb

## 2012-07-29 DIAGNOSIS — I83009 Varicose veins of unspecified lower extremity with ulcer of unspecified site: Secondary | ICD-10-CM

## 2012-07-29 DIAGNOSIS — L97909 Non-pressure chronic ulcer of unspecified part of unspecified lower leg with unspecified severity: Secondary | ICD-10-CM

## 2012-07-29 NOTE — Progress Notes (Signed)
The patient presents for continued followup of her venous hypertension. Had seen her initially on 07/08/2012 for open venous ulcer over the posterior calf on the left. She has been treating this appropriately with Silvadene and compression and is having improvement of this. She called concerning her right leg with increased erythema and is seen regarding this. She does report pain in both legs with standing.  Past Medical History  Diagnosis Date  . Hypertension   . Depression   . Asthma   . Ulcer     left posterior calf    History  Substance Use Topics  . Smoking status: Never Smoker   . Smokeless tobacco: Never Used  . Alcohol Use: No    History reviewed. No pertinent family history.  No Known Allergies  Current outpatient prescriptions:albuterol (PROVENTIL HFA;VENTOLIN HFA) 108 (90 BASE) MCG/ACT inhaler, Inhale 1-2 puffs into the lungs every 6 (six) hours as needed for wheezing., Disp: 1 Inhaler, Rfl: 0;  furosemide (LASIX) 20 MG tablet, Take 20 mg by mouth 2 (two) times daily., Disp: , Rfl: ;  ibuprofen (ADVIL,MOTRIN) 200 MG tablet, Take 200 mg by mouth every 6 (six) hours as needed. For pain, Disp: , Rfl:  lisinopril (PRINIVIL,ZESTRIL) 20 MG tablet, Take 20 mg by mouth daily., Disp: , Rfl: ;  losartan-hydrochlorothiazide (HYZAAR) 100-12.5 MG per tablet, Take 1 tablet by mouth daily., Disp: , Rfl: ;  zolpidem (AMBIEN) 5 MG tablet, Take 5 mg by mouth at bedtime as needed. For sleep, Disp: , Rfl:  neomycin-bacitracin-polymyxin (NEOSPORIN) OINT, Apply 1 application topically every 6 (six) hours as needed ( apply to L Leg twice a day)., Disp: 1 Tube, Rfl: 0;  Wound Dressings (MAXORB EXTRA AG+) PADS, Apply topically every other day., Disp: , Rfl:   BP 141/82  Pulse 73  Temp 98.2 F (36.8 C) (Oral)  Ht 5\' 9"  (1.753 m)  Wt 317 lb (143.79 kg)  BMI 46.81 kg/m2  SpO2 100%  LMP 11/21/2011  Body mass index is 46.81 kg/(m^2).       Physical exam is noted for erythema from her mid  calf distally on both legs. She does not have any open ulceration on the right leg. She does have improvement in the ulcers that were present on her left leg with these now being 2 different ulcerations maximal these being approximately 2-1/2 cm in diameter. He does have severe venous hypertension bilaterally. She is too obese to wear compression garments and therefore wrapping her left leg with 6 inch Ace. She was redressing of the left leg and also fitted with a 6 inch Ace wrap on her right leg as well instructed him to daily use of these. We will see her again in one month for continued followup

## 2012-08-05 ENCOUNTER — Ambulatory Visit: Payer: Medicaid Other | Admitting: Vascular Surgery

## 2012-08-22 ENCOUNTER — Encounter: Payer: Self-pay | Admitting: Vascular Surgery

## 2012-08-26 ENCOUNTER — Ambulatory Visit: Payer: Medicaid Other | Admitting: Vascular Surgery

## 2012-08-27 ENCOUNTER — Inpatient Hospital Stay: Payer: Medicaid Other | Admitting: Pulmonary Disease

## 2012-09-01 ENCOUNTER — Encounter: Payer: Self-pay | Admitting: Vascular Surgery

## 2012-09-01 ENCOUNTER — Emergency Department (HOSPITAL_COMMUNITY): Payer: Medicaid Other

## 2012-09-01 ENCOUNTER — Encounter (HOSPITAL_COMMUNITY): Payer: Self-pay | Admitting: *Deleted

## 2012-09-01 ENCOUNTER — Observation Stay (HOSPITAL_COMMUNITY)
Admission: EM | Admit: 2012-09-01 | Discharge: 2012-09-04 | Disposition: A | Discharge status: Home / Self Care | Payer: Medicaid Other | Attending: Internal Medicine | Admitting: Internal Medicine

## 2012-09-01 DIAGNOSIS — L97909 Non-pressure chronic ulcer of unspecified part of unspecified lower leg with unspecified severity: Secondary | ICD-10-CM

## 2012-09-01 DIAGNOSIS — F329 Major depressive disorder, single episode, unspecified: Secondary | ICD-10-CM | POA: Diagnosis present

## 2012-09-01 DIAGNOSIS — R079 Chest pain, unspecified: Secondary | ICD-10-CM | POA: Diagnosis present

## 2012-09-01 DIAGNOSIS — I83009 Varicose veins of unspecified lower extremity with ulcer of unspecified site: Secondary | ICD-10-CM | POA: Diagnosis present

## 2012-09-01 DIAGNOSIS — J45909 Unspecified asthma, uncomplicated: Secondary | ICD-10-CM | POA: Diagnosis present

## 2012-09-01 DIAGNOSIS — R0789 Other chest pain: Principal | ICD-10-CM | POA: Insufficient documentation

## 2012-09-01 DIAGNOSIS — I10 Essential (primary) hypertension: Secondary | ICD-10-CM | POA: Diagnosis present

## 2012-09-01 DIAGNOSIS — F3289 Other specified depressive episodes: Secondary | ICD-10-CM | POA: Insufficient documentation

## 2012-09-01 DIAGNOSIS — R911 Solitary pulmonary nodule: Secondary | ICD-10-CM | POA: Insufficient documentation

## 2012-09-01 DIAGNOSIS — Z23 Encounter for immunization: Secondary | ICD-10-CM | POA: Insufficient documentation

## 2012-09-01 DIAGNOSIS — F32A Depression, unspecified: Secondary | ICD-10-CM | POA: Diagnosis present

## 2012-09-01 DIAGNOSIS — R0602 Shortness of breath: Secondary | ICD-10-CM | POA: Insufficient documentation

## 2012-09-01 DIAGNOSIS — R1110 Vomiting, unspecified: Secondary | ICD-10-CM | POA: Insufficient documentation

## 2012-09-01 HISTORY — DX: Headache: R51

## 2012-09-01 LAB — CBC WITH DIFFERENTIAL/PLATELET
Basophils Absolute: 0 10*3/uL (ref 0.0–0.1)
Basophils Relative: 0 % (ref 0–1)
Eosinophils Absolute: 0.2 10*3/uL (ref 0.0–0.7)
Eosinophils Relative: 2 % (ref 0–5)
HCT: 37.9 % (ref 36.0–46.0)
Hemoglobin: 12.3 g/dL (ref 12.0–15.0)
Lymphocytes Relative: 21 % (ref 12–46)
Lymphs Abs: 1.8 10*3/uL (ref 0.7–4.0)
MCH: 29.1 pg (ref 26.0–34.0)
MCHC: 32.5 g/dL (ref 30.0–36.0)
MCV: 89.8 fL (ref 78.0–100.0)
Monocytes Absolute: 0.6 10*3/uL (ref 0.1–1.0)
Monocytes Relative: 7 % (ref 3–12)
Neutro Abs: 6.2 10*3/uL (ref 1.7–7.7)
Neutrophils Relative %: 70 % (ref 43–77)
Platelets: 259 10*3/uL (ref 150–400)
RBC: 4.22 MIL/uL (ref 3.87–5.11)
RDW: 14 % (ref 11.5–15.5)
WBC: 8.8 10*3/uL (ref 4.0–10.5)

## 2012-09-01 LAB — POCT I-STAT TROPONIN I: Troponin i, poc: 0 ng/mL (ref 0.00–0.08)

## 2012-09-01 NOTE — ED Notes (Addendum)
Pt tearful. Nasal congestion noted. Not able to breathe thru nose noted. C/o CP, describes as heaviness, reates 4/10, onset ~ 14 hr go, also reports some sob and nv. (denies: fever, cough, congestion, cold sx, diarrhea, fever or other sx). "Has stasis ulcer on LLE, fungus on RLE".

## 2012-09-01 NOTE — ED Notes (Signed)
Labs drawn by Kem Parkinson emt

## 2012-09-02 ENCOUNTER — Observation Stay (HOSPITAL_COMMUNITY): Payer: Medicaid Other

## 2012-09-02 ENCOUNTER — Encounter (HOSPITAL_COMMUNITY): Payer: Self-pay | Admitting: Internal Medicine

## 2012-09-02 ENCOUNTER — Ambulatory Visit: Payer: Medicaid Other | Admitting: Vascular Surgery

## 2012-09-02 ENCOUNTER — Inpatient Hospital Stay (HOSPITAL_COMMUNITY): Payer: Medicaid Other

## 2012-09-02 DIAGNOSIS — L97909 Non-pressure chronic ulcer of unspecified part of unspecified lower leg with unspecified severity: Secondary | ICD-10-CM

## 2012-09-02 DIAGNOSIS — I10 Essential (primary) hypertension: Secondary | ICD-10-CM | POA: Diagnosis present

## 2012-09-02 DIAGNOSIS — F329 Major depressive disorder, single episode, unspecified: Secondary | ICD-10-CM | POA: Diagnosis present

## 2012-09-02 DIAGNOSIS — J45909 Unspecified asthma, uncomplicated: Secondary | ICD-10-CM | POA: Diagnosis present

## 2012-09-02 DIAGNOSIS — R079 Chest pain, unspecified: Secondary | ICD-10-CM

## 2012-09-02 DIAGNOSIS — I83009 Varicose veins of unspecified lower extremity with ulcer of unspecified site: Secondary | ICD-10-CM

## 2012-09-02 LAB — POCT I-STAT TROPONIN I
Troponin i, poc: 0 ng/mL (ref 0.00–0.08)
Troponin i, poc: 0 ng/mL (ref 0.00–0.08)
Troponin i, poc: 0.01 ng/mL (ref 0.00–0.08)

## 2012-09-02 LAB — COMPREHENSIVE METABOLIC PANEL
ALT: 10 U/L (ref 0–35)
AST: 14 U/L (ref 0–37)
Albumin: 3.5 g/dL (ref 3.5–5.2)
Alkaline Phosphatase: 59 U/L (ref 39–117)
BUN: 15 mg/dL (ref 6–23)
CO2: 28 mEq/L (ref 19–32)
Calcium: 9.7 mg/dL (ref 8.4–10.5)
Chloride: 101 mEq/L (ref 96–112)
Creatinine, Ser: 0.84 mg/dL (ref 0.50–1.10)
GFR calc Af Amer: >90 mL/min (ref >90)
GFR calc non Af Amer: 80 mL/min — ABNORMAL LOW (ref >90)
Glucose, Bld: 112 mg/dL — ABNORMAL HIGH (ref 70–99)
Potassium: 3.6 mEq/L (ref 3.5–5.1)
Sodium: 138 mEq/L (ref 135–145)
Total Bilirubin: 0.4 mg/dL (ref 0.3–1.2)
Total Protein: 8 g/dL (ref 6.0–8.3)

## 2012-09-02 LAB — TROPONIN I
Troponin I: <0.3 ng/mL (ref <0.30)
Troponin I: <0.3 ng/mL (ref <0.30)

## 2012-09-02 LAB — D-DIMER, QUANTITATIVE: D-Dimer, Quant: 0.81 ug/mL-FEU — ABNORMAL HIGH (ref 0.00–0.48)

## 2012-09-02 LAB — PRO B NATRIURETIC PEPTIDE: Pro B Natriuretic peptide (BNP): 70.3 pg/mL (ref 0–125)

## 2012-09-02 MED ORDER — SILVER SULFADIAZINE 1 % EX CREA
1.0000 "application " | TOPICAL_CREAM | Freq: Every day | CUTANEOUS | Status: DC
  Filled 2012-09-02: qty 85

## 2012-09-02 MED ORDER — IOHEXOL 350 MG/ML SOLN
75.0000 mL | Freq: Once | INTRAVENOUS | Status: AC | PRN
  Administered 2012-09-02: 75 mL via INTRAVENOUS

## 2012-09-02 MED ORDER — TECHNETIUM TC 99M SESTAMIBI GENERIC - CARDIOLITE
30.0000 | Freq: Once | INTRAVENOUS | Status: AC | PRN
  Administered 2012-09-02: 30 via INTRAVENOUS

## 2012-09-02 MED ORDER — HYDROCHLOROTHIAZIDE 12.5 MG PO CAPS
12.5000 mg | ORAL_CAPSULE | Freq: Every day | ORAL | Status: DC
  Administered 2012-09-02 – 2012-09-04 (×3): 12.5 mg via ORAL
  Filled 2012-09-02 (×3): qty 1

## 2012-09-02 MED ORDER — FUROSEMIDE 20 MG PO TABS
20.0000 mg | ORAL_TABLET | Freq: Two times a day (BID) | ORAL | Status: DC
  Administered 2012-09-02 – 2012-09-04 (×4): 20 mg via ORAL
  Filled 2012-09-02 (×6): qty 1

## 2012-09-02 MED ORDER — HYDROCHLOROTHIAZIDE 12.5 MG PO CAPS
12.5000 mg | ORAL_CAPSULE | Freq: Every day | ORAL | Status: DC
Start: 2012-09-02 — End: 2012-09-02
  Administered 2012-09-02: 12.5 mg via ORAL
  Filled 2012-09-02: qty 1

## 2012-09-02 MED ORDER — LOSARTAN POTASSIUM-HCTZ 100-12.5 MG PO TABS: 1.0000 | ORAL_TABLET | Freq: Every day | ORAL | Status: DC

## 2012-09-02 MED ORDER — ASPIRIN EC 325 MG PO TBEC
325.0000 mg | DELAYED_RELEASE_TABLET | Freq: Every day | ORAL | Status: DC
  Administered 2012-09-02 – 2012-09-04 (×3): 325 mg via ORAL
  Filled 2012-09-02 (×3): qty 1

## 2012-09-02 MED ORDER — OXYCODONE HCL 5 MG PO TABS
2.5000 mg | ORAL_TABLET | Freq: Four times a day (QID) | ORAL | Status: DC | PRN
  Administered 2012-09-02: 2.5 mg via ORAL
  Filled 2012-09-02: qty 1

## 2012-09-02 MED ORDER — LOSARTAN POTASSIUM 50 MG PO TABS
100.0000 mg | ORAL_TABLET | Freq: Once | ORAL | Status: AC
  Administered 2012-09-02: 100 mg via ORAL
  Filled 2012-09-02: qty 2

## 2012-09-02 MED ORDER — ASPIRIN 81 MG PO CHEW
324.0000 mg | CHEWABLE_TABLET | Freq: Once | ORAL | Status: AC
  Administered 2012-09-02: 324 mg via ORAL
  Filled 2012-09-02: qty 4

## 2012-09-02 MED ORDER — ACETAMINOPHEN 325 MG PO TABS
650.0000 mg | ORAL_TABLET | Freq: Four times a day (QID) | ORAL | Status: DC | PRN
  Administered 2012-09-03 (×2): 650 mg via ORAL
  Filled 2012-09-02: qty 2

## 2012-09-02 MED ORDER — ONDANSETRON HCL 4 MG PO TABS: 4.0000 mg | ORAL_TABLET | Freq: Four times a day (QID) | ORAL | Status: DC | PRN

## 2012-09-02 MED ORDER — ENOXAPARIN SODIUM 40 MG/0.4ML ~~LOC~~ SOLN
40.0000 mg | SUBCUTANEOUS | Status: DC
  Administered 2012-09-02 – 2012-09-03 (×2): 40 mg via SUBCUTANEOUS
  Filled 2012-09-02 (×3): qty 0.4

## 2012-09-02 MED ORDER — SILVER SULFADIAZINE 1 % EX CREA
TOPICAL_CREAM | Freq: Every day | CUTANEOUS | Status: DC
  Administered 2012-09-02 – 2012-09-04 (×3): via TOPICAL
  Filled 2012-09-02: qty 85

## 2012-09-02 MED ORDER — ACETAMINOPHEN 650 MG RE SUPP: 650.0000 mg | Freq: Four times a day (QID) | RECTAL | Status: DC | PRN

## 2012-09-02 MED ORDER — KETOROLAC TROMETHAMINE 30 MG/ML IJ SOLN
30.0000 mg | Freq: Once | INTRAMUSCULAR | Status: AC
  Administered 2012-09-02: 30 mg via INTRAVENOUS
  Filled 2012-09-02: qty 1

## 2012-09-02 MED ORDER — ONDANSETRON HCL 4 MG/2ML IJ SOLN: 4.0000 mg | Freq: Four times a day (QID) | INTRAMUSCULAR | Status: DC | PRN

## 2012-09-02 MED ORDER — FUROSEMIDE 20 MG PO TABS
20.0000 mg | ORAL_TABLET | Freq: Once | ORAL | Status: AC
  Administered 2012-09-02: 20 mg via ORAL
  Filled 2012-09-02: qty 1

## 2012-09-02 MED ORDER — GI COCKTAIL ~~LOC~~
30.0000 mL | Freq: Once | ORAL | Status: AC
  Administered 2012-09-02: 30 mL via ORAL
  Filled 2012-09-02: qty 30

## 2012-09-02 MED ORDER — ALBUTEROL SULFATE HFA 108 (90 BASE) MCG/ACT IN AERS
1.0000 | INHALATION_SPRAY | Freq: Four times a day (QID) | RESPIRATORY_TRACT | Status: DC | PRN
  Filled 2012-09-02: qty 6.7

## 2012-09-02 MED ORDER — LOSARTAN POTASSIUM 50 MG PO TABS
100.0000 mg | ORAL_TABLET | Freq: Every day | ORAL | Status: DC
  Administered 2012-09-02 – 2012-09-04 (×3): 100 mg via ORAL
  Filled 2012-09-02 (×3): qty 2

## 2012-09-02 MED ORDER — LISINOPRIL 20 MG PO TABS
20.0000 mg | ORAL_TABLET | Freq: Once | ORAL | Status: AC
  Administered 2012-09-02: 20 mg via ORAL
  Filled 2012-09-02: qty 1

## 2012-09-02 MED ORDER — ACETAMINOPHEN 325 MG PO TABS
650.0000 mg | ORAL_TABLET | Freq: Once | ORAL | Status: AC
  Administered 2012-09-02: 650 mg via ORAL
  Filled 2012-09-02: qty 2

## 2012-09-02 MED ORDER — LISINOPRIL 20 MG PO TABS
20.0000 mg | ORAL_TABLET | Freq: Every day | ORAL | Status: DC
  Administered 2012-09-02 – 2012-09-04 (×3): 20 mg via ORAL
  Filled 2012-09-02 (×3): qty 1

## 2012-09-02 NOTE — ED Provider Notes (Addendum)
History     CSN: [id number]  Arrival date & time 09/01/12  2241   First MD Initiated Contact with Patient 09/01/12 2344      Chief Complaint  Patient presents with  . Chest Pain  . Emesis  . Shortness of Breath    (Consider location/radiation/quality/duration/timing/severity/associated sxs/prior treatment) HPI Comments: 51 year old female with a history of hypertension, depression, asthma and venous stasis ulcers who presents with a complaint of chest pain and headache. According to the patient she has been having an abnormal feeling in her chest which has been on and off for the last 2 days while she has been at home over the weekend. Approximately 2 hours ago she developed worsening pain which is currently 4/10, heaviness, midsternal in location and not associated with shortness of breath coughing or back pain. She does have some nausea and has vomited and has a headache. She does get headaches, they feel like this one does with a bilateral frontal pressure. According to the medical record the patient was recently admitted to the hospital under the hospitalist service and ruled out for myocardial infarction, had negative EKGs and troponins and was chest pain-free at the end of her stay. She states that this pain is similar to that, it seems to get worse with moving and palpation on her chest.  Patient is a 51 y.o. female presenting with chest pain, vomiting, and shortness of breath. The history is provided by the patient and medical records.  Chest Pain Primary symptoms include shortness of breath and vomiting.    Emesis   Shortness of Breath  Associated symptoms include chest pain and shortness of breath.    Past Medical History  Diagnosis Date  . Hypertension   . Depression   . Asthma   . Ulcer     left posterior calf    History reviewed. No pertinent past surgical history.  Family History  Problem Relation Age of Onset  . Heart failure Mother   . Heart disease Father       History  Substance Use Topics  . Smoking status: Never Smoker   . Smokeless tobacco: Never Used  . Alcohol Use: No    OB History    Grav Para Term Preterm Abortions TAB SAB Ect Mult Living                  Review of Systems  Respiratory: Positive for shortness of breath.   Cardiovascular: Positive for chest pain.  Gastrointestinal: Positive for vomiting.  All other systems reviewed and are negative.    Allergies  Review of patient's allergies indicates no known allergies.  Home Medications   No current outpatient prescriptions on file.  BP 112/92  Pulse 63  Temp 97.9 F (36.6 C) (Oral)  Resp 18  Ht 5\' 7"  (1.702 m)  Wt 311 lb 4.8 oz (141.205 kg)  BMI 48.76 kg/m2  SpO2 97%  LMP 11/21/2011  Physical Exam  Nursing note and vitals reviewed. Constitutional: She appears well-developed and well-nourished. No distress.  HENT:  Head: Normocephalic and atraumatic.  Mouth/Throat: Oropharynx is clear and moist. No oropharyngeal exudate.  Eyes: Conjunctivae and EOM are normal. Pupils are equal, round, and reactive to light. Right eye exhibits no discharge. Left eye exhibits no discharge. No scleral icterus.  Neck: Normal range of motion. Neck supple. No JVD present. No thyromegaly present.  Cardiovascular: Normal rate, regular rhythm, normal heart sounds and intact distal pulses.  Exam reveals no gallop and no friction rub.  No murmur heard. Pulmonary/Chest: Effort normal and breath sounds normal. No respiratory distress. She has no wheezes. She has no rales. She exhibits tenderness ( Reproducible midsternal cheest tenderness).  Abdominal: Soft. Bowel sounds are normal. She exhibits no distension and no mass. There is no tenderness.  Musculoskeletal: Normal range of motion. She exhibits edema ( bilateral lower strumming edema with stasis ulcers, patient states consistent with prior). She exhibits no tenderness.  Lymphadenopathy:    She has no cervical adenopathy.   Neurological: She is alert. Coordination normal.  Skin: Skin is warm and dry. No rash noted. No erythema.  Psychiatric: She has a normal mood and affect. Her behavior is normal.    ED Course  Procedures (including critical care time)  Labs Reviewed  COMPREHENSIVE METABOLIC PANEL - Abnormal; Notable for the following:    Glucose, Bld 112 (*)     GFR calc non Af Amer 80 (*)     All other components within normal limits  D-DIMER, QUANTITATIVE - Abnormal; Notable for the following:    D-Dimer, Quant 0.81 (*)     All other components within normal limits  CBC WITH DIFFERENTIAL  PRO B NATRIURETIC PEPTIDE  POCT I-STAT TROPONIN I  POCT I-STAT TROPONIN I  POCT I-STAT TROPONIN I  POCT I-STAT TROPONIN I  TROPONIN I  TROPONIN I  TROPONIN I  BASIC METABOLIC PANEL  CBC  PROTIME-INR   Dg Chest 2 View  09/02/2012  *RADIOLOGY REPORT*  Clinical Data: Chest pain.  Shortness of breath.  Emesis.  CHEST - 2 VIEW  Comparison: 06/2 the 8/13  Findings: Shallow inspiration.  Mild cardiac enlargement and mild pulmonary vascular congestion.  Changes are similar to previous study.  No evidence of focal consolidation or edema.  No blunting of costophrenic angles.  No pneumothorax.  Tortuous aorta. Mediastinal contours appear intact.  IMPRESSION: Cardiac enlargement with mild pulmonary vascular congestion similar previous study.  No edema or consolidation.   Original Report Authenticated By: Neale Burly, M.D.    Ct Angio Chest Pe W/cm &/or Wo Cm  09/02/2012  *RADIOLOGY REPORT*  Clinical Data: Chest pain and cough.  CT ANGIOGRAPHY CHEST  Technique:  Multidetector CT imaging of the chest using the standard protocol during bolus administration of intravenous contrast. Multiplanar reconstructed images including MIPs were obtained and reviewed to evaluate the vascular anatomy.  Contrast: 72mL OMNIPAQUE IOHEXOL 350 MG/ML SOLN  Comparison: Chest CT 06/21/2012 chest radiograph 09/01/2012  Findings: The main  pulmonary arteries and the lower lobe pulmonary arteries are patent without large filling defects.  Limited evaluation of the upper lobe pulmonary arteries due to poor contrast opacification.  Again noted is prominent chest lymphadenopathy.  Subcarinal lymph node measures 1.9 cm on sequence #4, image 38.  There is extensive right hilar lymphadenopathy. Index lymph node on image 31, sequence 4, measures 1.6 cm.  That appears to be stable left hilar lymphadenopathy.  Prominent prevascular soft tissue node measures 1.2 cm on sequence 4, image 21 and unchanged.  No significant axillary lymphadenopathy.  No significant pericardial pleural fluid.  The trachea and mainstem bronchi are patent.  Stable nodule in the right lower lobe on sequence 5, image 42, measures 7 mm.  There may be a stable nodule in the right middle lobe on image 43.  Stable nodular density along the medial right upper lung measures 0.9 cm on image 31.  Again noted is a nodular density in the left lower lobe on image 36. Stable 1.1 cm nodule in  the left lower lobe on sequence 5, image 53.  There are patchy ground-glass opacities throughout the lungs which could represent areas of air trapping and atelectasis. No acute bony abnormality.  There appears to be enlargement the left thyroid lobe.  IMPRESSION: No evidence for a large pulmonary embolism.  The study has limitations due to the patient's body habitus and poor opacification of the upper pulmonary arteries.  There is persistent mediastinal and hilar lymphadenopathy.  In addition, there are stable bilateral parenchymal nodules. These findings are nonspecific but could represent granulomatous disease, particularly sarcoidosis.  Please note that a neoplastic process cannot be completely excluded.  Recommend Pulmonary consultation for further evaluation of the lung nodules and lymphadenopathy.   Original Report Authenticated By: Markus Daft, M.D.      1. Chest pain   2. Asthma   3. Hypertension     4. Varicose veins of lower extremities with ulcer       MDM  Patient has ongoing symptoms, has been approximately 2 hours of constant pain, it is 4/10, EKG is normal with no signs of ischemia can't completely unchanged from 06/22/2012. First set of troponins are normal and white blood cell count of 8.8.  The patient has ongoing chest pain, this was discussed with the oncoming team including physician and nurse practitioner, cardiology to be involved, troponin negative thus far.   ED ECG REPORT  I personally interpreted this EKG   Date: 09/01/12  Rate: 76  Rhythm: normal sinus rhythm  QRS Axis: normal  Intervals: normal  ST/T Wave abnormalities: normal  Conduction Disutrbances:Left anterior fascicular block  Narrative Interpretation: Left ventricular hypertrophy, poor R-wave progression  Old EKG Reviewed: Compared with 06/20/2012, poor R-wave progression no found, T wave abnormality has resolved, rate has slowed from 122 beats per minute.      Johnna Acosta, MD 09/03/12 [id number]  Johnna Acosta, MD 09/03/12 [phone]

## 2012-09-02 NOTE — ED Notes (Signed)
REPORT CALLED TO JESSICA. REPORT GIVEN BUT PER NURSE ROOM IS NOT CLEAN.

## 2012-09-02 NOTE — ED Notes (Signed)
Pt also reports BLE ulcers

## 2012-09-02 NOTE — H&P (Addendum)
Patient's PCP: Maggie Font, MD Patient's Vascular Surgeon: Dr. Sherren Mocha Early  Chief Complaint: Chest pain  History of Present Illness: Kari [name] is a 51 y.o. African American female with history of hypertension, asthma, depression, morbid obesity, chronic bilateral lower extremity venostasis changes with left lower extremity ulcers currently being cared for by Dr. Donnetta Hutching, who presented today with the complaints of chest pain.  Patient reports that her symptoms started about a week ago with constant nagging chest pain.  Last night at approximately 11 p.m. she developed chest pain with some radiating pain to the jaw.  It lasted for about 5-10 minutes and improved.  She also became very nauseated and vomited.  She also last week has had dyspnea on exertion.  She had initially presented to the hospital in June of 2013 with similar complaints at that time atypical chest pain was felt to be due to viral etiology.  She denies any recent fevers, chills, abdominal pain, diarrhea, or vision changes.  Does complain of some headaches.  She also did complain of some right arm pain last night which resolved.  Past Medical History  Diagnosis Date  . Hypertension   . Depression   . Asthma   . Ulcer     left posterior calf   History reviewed. No pertinent past surgical history. Family History  Problem Relation Age of Onset  . Heart failure Mother   . Heart disease Father    History   Social History  . Marital Status: Married    Spouse Name: N/A    Number of Children: N/A  . Years of Education: N/A   Occupational History  . Not on file.   Social History Main Topics  . Smoking status: Never Smoker   . Smokeless tobacco: Never Used  . Alcohol Use: No  . Drug Use: No  . Sexually Active: Not on file   Other Topics Concern  . Not on file   Social History Narrative  . No narrative on file   Allergies: Review of patient's allergies indicates no known allergies.  Meds: Scheduled Meds:     . acetaminophen  650 mg Oral Once  . aspirin  324 mg Oral Once  . aspirin EC  325 mg Oral Daily  . enoxaparin (LOVENOX) injection  40 mg Subcutaneous Q24H  . furosemide  20 mg Oral Once  . furosemide  20 mg Oral BID  . gi cocktail  30 mL Oral Once  . ketorolac  30 mg Intravenous Once  . lisinopril  20 mg Oral Once  . lisinopril  20 mg Oral Daily  . losartan  100 mg Oral Once  . losartan-hydrochlorothiazide  1 tablet Oral Daily  . silver sulfADIAZINE  1 application Topical Daily  . DISCONTD: hydrochlorothiazide  12.5 mg Oral Daily   Continuous Infusions:  PRN Meds:.acetaminophen, acetaminophen, albuterol, ondansetron (ZOFRAN) IV, ondansetron, oxyCODONE  Review of Systems: All systems reviewed with the patient and positive as per history of present illness, otherwise all other systems are negative.  Physical Exam: Blood pressure 162/88, pulse 73, temperature 97.5 F (36.4 C), temperature source Oral, resp. rate 18, height 5\' 7"  (1.702 m), weight 141.205 kg (311 lb 4.8 oz), last menstrual period 11/21/2011, SpO2 99.00%. General: Awake, Oriented x3, No acute distress. HEENT: EOMI, Moist mucous membranes Neck: Supple CV: S1 and S2 Lungs: Clear to ascultation bilaterally Abdomen: Soft, Nontender, Nondistended, +bowel sounds. Ext: Good pulses. Trace edema.  Bilateral chronic lower extremity venous stasis changes. With two small  LLE calf ulcers measuing ~3x2 cm and 1x2 cm, healing well, granulating no signs of infection. Neuro: Cranial Nerves II-XII grossly intact. Has 5/5 motor strength in upper and lower extremities.  Lab results:  [facility] 09/01/12 2318  NA 138  K 3.6  CL 101  CO2 28  GLUCOSE 112*  BUN 15  CREATININE 0.84  CALCIUM 9.7  MG --  PHOS --    Basename 09/01/12 2318  AST 14  ALT 10  ALKPHOS 59  BILITOT 0.4  PROT 8.0  ALBUMIN 3.5   No results found for this basename: LIPASE:2,AMYLASE:2 in the last 72 hours  Basename 09/01/12 2318  WBC 8.8  NEUTROABS  6.2  HGB 12.3  HCT 37.9  MCV 89.8  PLT 259   No results found for this basename: CKTOTAL:3,CKMB:3,CKMBINDEX:3,TROPONINI:3 in the last 72 hours No components found with this basename: POCBNP:3  Basename 09/02/12 1001  DDIMER 0.81*   No results found for this basename: HGBA1C:2 in the last 72 hours No results found for this basename: CHOL:2,HDL:2,LDLCALC:2,TRIG:2,CHOLHDL:2,LDLDIRECT:2 in the last 72 hours No results found for this basename: TSH,T4TOTAL,FREET3,T3FREE,THYROIDAB in the last 72 hours No results found for this basename: VITAMINB12:2,FOLATE:2,FERRITIN:2,TIBC:2,IRON:2,RETICCTPCT:2 in the last 72 hours Imaging results:  Dg Chest 2 View  09/02/2012  *RADIOLOGY REPORT*  Clinical Data: Chest pain.  Shortness of breath.  Emesis.  CHEST - 2 VIEW  Comparison: 06/2 the 8/13  Findings: Shallow inspiration.  Mild cardiac enlargement and mild pulmonary vascular congestion.  Changes are similar to previous study.  No evidence of focal consolidation or edema.  No blunting of costophrenic angles.  No pneumothorax.  Tortuous aorta. Mediastinal contours appear intact.  IMPRESSION: Cardiac enlargement with mild pulmonary vascular congestion similar previous study.  No edema or consolidation.   Original Report Authenticated By: Neale Burly, M.D.    Other results: EKG: Sinus rhythm with PVCs.  Assessment & Plan by Problem: Atypical chest pain Admit the patient to telemetry.  Trend troponins to the patient out for acute coronary syndrome.  Dr. Radford Pax with cardiology has evaluated the patient.  2-D echocardiogram ordered.  Given elevated d-dimer we will get CT of the chest with contrast to evaluate for pulmonary embolism.  Recent CT angio chest on 06/21/2012 showed no pulmonary embolism, 6 mm right lower lobe nodule was noted.  Cardiology planning on 2 day stress test.  Continue aspirin.  Chronic bilateral lower extremity venous stasis changes with 2 healing ulcers on posterior calf of left  lower extremity. Healing well.  Continue wound care care.  Patient to followup with Dr. Donnetta Hutching for further management.  Does not show any signs of infection at this time.  Hypertension Stable.  Continue home antihypertensive medications.  Depression Stable.  Not on any medications at this time.  Asthma Stable.  Continue albuterol inhaler as needed.  Currently not in exacerbation.  6 mm right lower lobe nodule Will need outpatient followup in 6-12 months.  Morbid obesity Diet and exercise as outpatient.  Prophylaxis Lovenox.  CODE STATUS Full code.  Time spent on admission, talking to the patient, and coordinating care was: 60 mins.  Tametria Aho A, MD 09/02/2012, 1:59 PM

## 2012-09-02 NOTE — Progress Notes (Addendum)
Disposition Note  Kari [name], is a 51 y.o. female,   MRN: [id number]  -  DOB - April 03, 1961  Outpatient Primary MD for the patient is HILL,GERALD K, MD   Blood pressure 132/69, pulse 77, temperature 98.3 F (36.8 C), temperature source Oral, resp. rate 20, height 5\' 7"  (1.702 m), weight 141.205 kg (311 lb 4.8 oz), last menstrual period 11/21/2011, SpO2 98.00%.  Active Problems:  Chest pain  Venous ulcer of leg  Venous stasis ulcers  Asthma  Hypertension  Depression   50 yo obese female coming in with chest pain.  CDU attempted to put her on CDU protocol but could not due to her weight.  She was in the hospital 1 month ago under [facility] service for atypical chest pain.  It was felt the pain was due to an atypical infection causing cough this was treated with antibiotics.  An outpatient follow up appointment was suggested with Dr. Einar Gip if the patients chest pain returned.  She did not follow up.  She presents in the ED today with chest pain and headache.  She has been having pain intermittently for two days.  It feels like a mid sternal heaviness and is reproducible on palpation.   She has had nausea and vomited once.  Labs (BNP, Troponin) and EKG appear normal.  Dr. Fransico Him of [facility] has seen the patient in the ED and written a consult note.  She has planned a 2 day lexiscan cardiolyte and a 2d echo.  I have requested a cardiac tele bed.  Further the patient has a lung nodule and venous stasis left leg ulcer.   The ulcer is being cared for out patient, and the patient was to see out patient pulmonology for the lung nodule.  Imogene Burn, PA-C [facility] Pager: [phone]

## 2012-09-02 NOTE — Progress Notes (Signed)
Observation review is complete. 

## 2012-09-02 NOTE — Care Management Note (Signed)
    Page 1 of 1   09/04/2012     9:54:23 AM   CARE MANAGEMENT NOTE 09/04/2012  Patient:  Kari [name], Kari [name]   Account Number:  [id number]  Date Initiated:  09/02/2012  Documentation initiated by:  GRAVES-BIGELOW,Gibson Lad  Subjective/Objective Assessment:   Pt admitted with cp.     Action/Plan:   Cm will continue to monitor for disposition needs.   Anticipated DC Date:  09/03/2012   Anticipated DC Plan:  [place]  CM consult      Choice offered to / List presented to:          [facility] arranged  HH-1 RN  [place].   Status of service:  Completed, signed off Medicare Important Message given?   (If response is "NO", the following Medicare IM given date fields will be blank) Date Medicare IM given:   Date Additional Medicare IM given:    Discharge Disposition:  [place]  Per UR Regulation:  Reviewed for med. necessity/level of care/duration of stay  If discussed at [place] of Stay Meetings, dates discussed:    Comments:  09-03-12 [place], RN, BSN [phone] CM made consult for [facility] for disease management and wound care with [facility] SOC to begin within 24-48 hours post d/c.

## 2012-09-02 NOTE — ED Provider Notes (Signed)
Medical screening examination/treatment/procedure(s) were performed by non-physician practitioner and as supervising physician I was immediately available for consultation/collaboration.   Julianne Rice, MD 09/02/12 1435

## 2012-09-02 NOTE — ED Notes (Signed)
Kari [name], pt's niece [phone];;;; Governor Rooks, pt's daughter [phone]

## 2012-09-02 NOTE — ED Provider Notes (Signed)
0700 report received from Dr. Sabra Heck. Patient has had midsternal chest pressure x24 hours that is reproducible. Patient's weight is 311lbs.  so she cannot have the stress test for this cardiac CT. Will page unassigned cardiology to see if there are any other options. 0830 spoke with Dr. Wynonia Lawman who states that he is not on call for the ER to notify by mouth cardiology Dr. Radford Pax. 920am spoke with Dr. Radford Pax she will come and evaluate patient and decide her  Disposition. May opt for the Lexi scan Cardiolite.   1040   Patient will be admitted to the hospitalist team 10. Rule out chest pain. Dr. Radford Pax will consult. Patient had a positive d-dimer today and on June 29 with negative CT of the chest in July 11. We'll hold off on CT of the chest for now. Trop - x 2.  EKG unchanged.  Pt's weight is 311lbs  Too large for CDU protocol.     Labs Reviewed  COMPREHENSIVE METABOLIC PANEL - Abnormal; Notable for the following:    Glucose, Bld 112 (*)     GFR calc non Af Amer 80 (*)     All other components within normal limits  D-DIMER, QUANTITATIVE - Abnormal; Notable for the following:    D-Dimer, Quant 0.81 (*)     All other components within normal limits  CBC WITH DIFFERENTIAL  PRO B NATRIURETIC PEPTIDE  POCT I-STAT TROPONIN I  POCT I-STAT TROPONIN I  POCT I-STAT TROPONIN I  POCT I-STAT TROPONIN I    Julieta Bellini, NP 09/02/12 1113

## 2012-09-02 NOTE — ED Notes (Signed)
Pt reports mid-sternum chest "heaviness" starting yesterday 1000 am, pt also reports feeling nauseous and some SOB. Pt rates her pain 4/10, states nothing relieves the pain nothing makes the pain worse. Pt denies back/abd pain, vomiting, diaphoresis, fever, cough, or congestion.

## 2012-09-02 NOTE — Consult Note (Addendum)
Admit date: 09/01/2012 Referring Physician ER MD Primary Cardiologist None Chief complaint/reason for admission: chest pain  HPI:  This is a 51yo morbidly obese BF with a history of asthma and HTN who presented to the ER with complaints of chest pain.  She says that about 11PM she developed chest pain while standing in her bedroom talking to her grandchild.  She says that intermittently last week she would have a sharp pain in her chest that would last 5-10 minutes and would subside.  Last PM the pain was sharp and heavy and she could not take a deep breath in.  She got nauseated and she vomited.  She denied and diaphoresis.  She has had some problems with DOE.  She was in the hospital in June 2013 with atypical chest pain and was felt to be due to a virus.   She says that her pain is similar to what she had during her admission in June at which time a chest CT showed possible atypical lung infection and she was treated with antibiotics.  The pain has constant for 12 hours without relief and cardiac enzymes have been normal.  She has had chills today and mild nonproductive cough.      PMH:    Past Medical History  Diagnosis Date  . Hypertension   . Depression   . Asthma   . Ulcer     left posterior calf    PSH:   History reviewed. No pertinent past surgical history.  ALLERGIES:   Review of patient's allergies indicates no known allergies.  Prior to Admit Meds:   (Not in a hospital admission) Family HX:   No family history on file. Social HX:    History   Social History  . Marital Status: Married    Spouse Name: N/A    Number of Children: N/A  . Years of Education: N/A   Occupational History  . Not on file.   Social History Main Topics  . Smoking status: Never Smoker   . Smokeless tobacco: Never Used  . Alcohol Use: No  . Drug Use: No  . Sexually Active: Not on file   Other Topics Concern  . Not on file   Social History Narrative  . No narrative on file     ROS:  All 11  ROS were addressed and are negative except what is stated in the HPI  PHYSICAL EXAM Filed Vitals:   09/02/12 0807  BP: 132/69  Pulse: 77  Temp: 98.3 F (36.8 C)  Resp: 20   General: Well developed, well nourished, in no acute distress Head: Eyes PERRLA, No xanthomas.   Normal cephalic and atramatic  Lungs:   Clear bilaterally to auscultation and percussion. Heart:   HRRR S1 S2 Pulses are 2+ & equal.            No carotid bruit. No JVD.  No abdominal bruits. No femoral bruits. Abdomen: Bowel sounds are positive, abdomen soft and non-tender without masses  Extremities:   No clubbing, cyanosis or edema.  DP +1 Neuro: Alert and oriented X 3. Psych:  Good affect, responds appropriately   Labs:   Lab Results  Component Value Date   WBC 8.8 09/01/2012   HGB 12.3 09/01/2012   HCT 37.9 09/01/2012   MCV 89.8 09/01/2012   PLT 259 09/01/2012    Lab 09/01/12 2318  NA 138  K 3.6  CL 101  CO2 28  BUN 15  CREATININE 0.84  CALCIUM 9.7  PROT 8.0  BILITOT 0.4  ALKPHOS 59  ALT 10  AST 14  GLUCOSE 112*   Lab Results  Component Value Date   CKTOTAL 133 06/21/2012   CKMB 2.0 06/21/2012   TROPONINI <0.30 06/21/2012   No results found for this basename: PTT   No results found for this basename: INR, PROTIME     Lab Results  Component Value Date   CHOL 135 06/21/2012   Lab Results  Component Value Date   HDL 54 06/21/2012   Lab Results  Component Value Date   LDLCALC 69 06/21/2012   Lab Results  Component Value Date   TRIG 59 06/21/2012   Lab Results  Component Value Date   CHOLHDL 2.5 06/21/2012   No results found for this basename: LDLDIRECT      Radiology:  *RADIOLOGY REPORT*  Clinical Data: Chest pain. Shortness of breath. Emesis.  CHEST - 2 VIEW  Comparison: 06/2 the 8/13  Findings: Shallow inspiration. Mild cardiac enlargement and mild  pulmonary vascular congestion. Changes are similar to previous  study. No evidence of focal consolidation or edema. No blunting  of  costophrenic angles. No pneumothorax. Tortuous aorta.  Mediastinal contours appear intact.  IMPRESSION:  Cardiac enlargement with mild pulmonary vascular congestion similar  previous study. No edema or consolidation.  Original Report Authenticated By: Neale Burly, M.D.   EKG:  NSR with occasional PVC's  ASSESSMENT:  1.  Atypical sharp chest pain of unclear etiology.  Pain has been constant for 12 hours with normal EKG and normal cardiac enzymes making acute coronary syndrome less likely.  She has a similar episode last June and was treated for a lung infection.  Her symptoms are similar this time and she has had a cough and pleuritic CP.  BNP is normal 2.  HTN 3.  Morbid obesity 4.  Asthma 5.  Depression  PLAN:   1.  NPO after midnight 2.  2 day Lexiscan cardiolyte - rest images today 3.  2D echo to evaluate for pericardial effusion and LVF 4.  Check D-Dimer to rule out PE   Sueanne Margarita, MD  09/02/2012  9:37 AM

## 2012-09-02 NOTE — Consult Note (Signed)
WOC consult Note Reason for Consult: Consult requested for left leg wounds.  Pt states these are chronic stasis ulcers and she is followed as outpatient by Dr Donnetta Hutching who has prescribed Silvadene and ace wraps. Wound type: Full thickness Pressure Ulcer POA: These are not pressure ulcers Measurement: .3X.3X.1cm and 3X3X.2cm Wound bed: Both sites 80% yellow, 20% red. Drainage (amount, consistency, odor) Mod amt yellow drainage, no odor. Periwound: Intact  Right leg without open wounds or drainage, dry scabbed skin to calf. Dressing procedure/placement/frequency: Continue present plan of care with Silvadene and ace wraps to BLE for light compression. Pt can resume follow-up with physician after discharge. Will not plan to follow further unless re-consulted.  [address] [place], MSN, [place]

## 2012-09-03 LAB — CBC
HCT: 35.2 % — ABNORMAL LOW (ref 36.0–46.0)
Hemoglobin: 11 g/dL — ABNORMAL LOW (ref 12.0–15.0)
MCH: 28.1 pg (ref 26.0–34.0)
MCHC: 31.3 g/dL (ref 30.0–36.0)
MCV: 90 fL (ref 78.0–100.0)
Platelets: 221 10*3/uL (ref 150–400)
RBC: 3.91 MIL/uL (ref 3.87–5.11)
RDW: 14.2 % (ref 11.5–15.5)
WBC: 6.6 10*3/uL (ref 4.0–10.5)

## 2012-09-03 LAB — BASIC METABOLIC PANEL
BUN: 14 mg/dL (ref 6–23)
CO2: 28 mEq/L (ref 19–32)
Calcium: 9.1 mg/dL (ref 8.4–10.5)
Chloride: 102 mEq/L (ref 96–112)
Creatinine, Ser: 0.89 mg/dL (ref 0.50–1.10)
GFR calc Af Amer: 86 mL/min — ABNORMAL LOW (ref >90)
GFR calc non Af Amer: 74 mL/min — ABNORMAL LOW (ref >90)
Glucose, Bld: 94 mg/dL (ref 70–99)
Potassium: 3.5 mEq/L (ref 3.5–5.1)
Sodium: 138 mEq/L (ref 135–145)

## 2012-09-03 LAB — TROPONIN I: Troponin I: <0.3 ng/mL (ref <0.30)

## 2012-09-03 LAB — PROTIME-INR
INR: 1.16 (ref 0.00–1.49)
Prothrombin Time: 15 seconds (ref 11.6–15.2)

## 2012-09-03 MED ORDER — REGADENOSON 0.4 MG/5ML IV SOLN
0.4000 mg | Freq: Once | INTRAVENOUS | Status: AC
  Administered 2012-09-03: 0.4 mg via INTRAVENOUS
  Filled 2012-09-03: qty 5

## 2012-09-03 MED ORDER — INFLUENZA VIRUS VACC SPLIT PF IM SUSP
0.5000 mL | INTRAMUSCULAR | Status: AC
  Administered 2012-09-04: 0.5 mL via INTRAMUSCULAR
  Filled 2012-09-03: qty 0.5

## 2012-09-03 MED ORDER — TECHNETIUM TC 99M SESTAMIBI GENERIC - CARDIOLITE
30.0000 | Freq: Once | INTRAVENOUS | Status: AC | PRN
  Administered 2012-09-03: 30 via INTRAVENOUS

## 2012-09-03 MED ORDER — TECHNETIUM TC 99M SESTAMIBI GENERIC - CARDIOLITE
30.0000 | Freq: Once | INTRAVENOUS | Status: AC | PRN
  Administered 2012-09-02: 30 via INTRAVENOUS

## 2012-09-03 MED ORDER — ACETAMINOPHEN 325 MG PO TABS
ORAL_TABLET | ORAL | Status: AC
  Filled 2012-09-03: qty 2

## 2012-09-03 MED ORDER — REGADENOSON 0.4 MG/5ML IV SOLN
INTRAVENOUS | Status: AC
  Administered 2012-09-03: 0.4 mg via INTRAVENOUS
  Filled 2012-09-03: qty 5

## 2012-09-03 NOTE — Progress Notes (Signed)
[facility] PROGRESS NOTE  Kari [name] [id number] DOB: 1961-11-30 DOA: 09/01/2012 PCP: Kari Font, MD  Assessment/Plan: Principal Problem:  *Chest pain Active Problems:  Varicose veins of lower extremities with ulcer  Venous ulcer of leg  Venous stasis ulcers  Asthma  Hypertension  Depression  Atypical chest pain Troponins remained negative.  2-D echocardiogram shows preserved LV function, no focal wall motion abnormalities and mild biatrial enlargement.  CT chest negative for PE.  -  F/u stress test and cardiology recommendations (preliminary reports shows patient's HR only increased 53% with inadequate study)  Mediastinal LAD and right LL pulmonary nodule 47mm.  Concern for possible sarcoid given appearance on CT. -  Patient has already been referred for outpatient pulmonary appointment. -    Chronic bilateral lower extremity venous stasis changes with 2 healing ulcers on posterior calf of left lower extremity.  Healing well. Continue wound care care. Patient to followup with Dr. Donnetta [name] for further management. Does not show any signs of infection at this time.   Hypertension  Stable. Continue home antihypertensive medications.   Depression  Stable. Not on any medications at this time.   Asthma  Stable. Continue albuterol inhaler as needed. Currently not in exacerbation.   Morbid obesity  Diet and exercise as outpatient.   DIET:  Healthy heart ACCESS:  PIV  PROPH:  lovenox Code Status: full Family Communication: spoke with patient Disposition Plan: pending official stress test result and cardiology recommendations   Brief narrative:  Kari [name] is a 51 y.o. African American female with history of hypertension, asthma, depression, morbid obesity, chronic bilateral lower extremity venostasis changes with left lower extremity ulcers currently being cared for by Dr. Donnetta [name], who presented today with the complaints of chest pain. Patient reports that  her symptoms started about a week ago with constant nagging chest pain. Last night at approximately 11 p.m. she developed chest pain with some radiating pain to the jaw. It lasted for about 5-10 minutes and improved. She also became very nauseated and vomited. She also last week has had dyspnea on exertion. She had initially presented to the hospital in June of 2013 with similar complaints at that time atypical chest pain was felt to be due to viral etiology. She denies any recent fevers, chills, abdominal pain, diarrhea, or vision changes. Does complain of some headaches. She also did complain of some right arm pain last night which resolved.    Consultants:  cardiology  Procedures:  ECHO 9/11  NM stress 9/11  Antibiotics:  none  HPI/Subjective:  Patient states she continues to feel a milder "twinge" in her chest which is not associated with shortness of breath.  Denies nausea, vomiting, diarrhea, constipation, weight loss, night sweats.  Objective: Filed Vitals:   09/02/12 1837 09/02/12 2100 09/03/12 0500 09/03/12 0846  BP: 140/70 107/70 112/92 129/79  Pulse:  70 63   Temp:  98.1 F (36.7 C) 97.9 F (36.6 C)   TempSrc:  Oral Oral   Resp:      Height:      Weight:      SpO2:  96% 97%     Intake/Output Summary (Last 24 hours) at 09/03/12 0851 Last data filed at 09/02/12 1200  Gross per 24 hour  Intake    360 ml  Output      0 ml  Net    360 ml   Filed Weights   09/02/12 0253  Weight: 141.205 kg (311 lb 4.8 oz)  Exam:    General:   Obese AAF, no acute distress  Head: PERRLA, Normal cephalic and atramatic, MMM Lungs:  CTAB Heart: RRR, no mumrur Abdomen: Bowel sounds are normal, abdomen soft and non-tender without masses  Extremities: No clubbing, cyanosis or edema. DP +1  Neuro: Alert and oriented X 3.  Psych: A&Ox4   Data Reviewed: Basic Metabolic Panel:  Lab [id number] 09/01/12 2318  NA 138 138  K 3.5 3.6  CL 102 101  CO2 28 28  GLUCOSE 94  112*  BUN 14 15  CREATININE 0.89 0.84  CALCIUM 9.1 9.7  MG -- --  PHOS -- --   Liver Function Tests:  Lab 09/01/12 2318  AST 14  ALT 10  ALKPHOS 59  BILITOT 0.4  PROT 8.0  ALBUMIN 3.5   No results found for this basename: LIPASE:5,AMYLASE:5 in the last 168 hours No results found for this basename: AMMONIA:5 in the last 168 hours CBC:  Lab 09/03/12 0550 09/01/12 2318  WBC 6.6 8.8  NEUTROABS -- 6.2  HGB 11.0* 12.3  HCT 35.2* 37.9  MCV 90.0 89.8  PLT 221 259   Cardiac Enzymes:  Lab 09/03/12 0555 09/02/12 2242 09/02/12 1503  CKTOTAL -- -- --  CKMB -- -- --  CKMBINDEX -- -- --  TROPONINI <0.30 <0.30 <0.30   BNP (last 3 results)  Basename 09/01/12 2319 06/21/12 0950  PROBNP 70.3 415.7*   CBG: No results found for this basename: GLUCAP:5 in the last 168 hours  No results found for this or any previous visit (from the past 240 hour(s)).   Studies: Dg Chest 2 View  09/02/2012  *RADIOLOGY REPORT*  Clinical Data: Chest pain.  Shortness of breath.  Emesis.  CHEST - 2 VIEW  Comparison: 06/2 the 8/13  Findings: Shallow inspiration.  Mild cardiac enlargement and mild pulmonary vascular congestion.  Changes are similar to previous study.  No evidence of focal consolidation or edema.  No blunting of costophrenic angles.  No pneumothorax.  Tortuous aorta. Mediastinal contours appear intact.  IMPRESSION: Cardiac enlargement with mild pulmonary vascular congestion similar previous study.  No edema or consolidation.   Original Report Authenticated By: Neale Burly, M.D.    Ct Angio Chest Pe W/cm &/or Wo Cm  09/02/2012  *RADIOLOGY REPORT*  Clinical Data: Chest pain and cough.  CT ANGIOGRAPHY CHEST  Technique:  Multidetector CT imaging of the chest using the standard protocol during bolus administration of intravenous contrast. Multiplanar reconstructed images including MIPs were obtained and reviewed to evaluate the vascular anatomy.  Contrast: 20mL OMNIPAQUE IOHEXOL 350 MG/ML  SOLN  Comparison: Chest CT 06/21/2012 chest radiograph 09/01/2012  Findings: The main pulmonary arteries and the lower lobe pulmonary arteries are patent without large filling defects.  Limited evaluation of the upper lobe pulmonary arteries due to poor contrast opacification.  Again noted is prominent chest lymphadenopathy.  Subcarinal lymph node measures 1.9 cm on sequence #4, image 38.  There is extensive right hilar lymphadenopathy. Index lymph node on image 31, sequence 4, measures 1.6 cm.  That appears to be stable left hilar lymphadenopathy.  Prominent prevascular soft tissue node measures 1.2 cm on sequence 4, image 21 and unchanged.  No significant axillary lymphadenopathy.  No significant pericardial pleural fluid.  The trachea and mainstem bronchi are patent.  Stable nodule in the right lower lobe on sequence 5, image 42, measures 7 mm.  There may be a stable nodule in the right middle lobe on image 43.  Stable  nodular density along the medial right upper lung measures 0.9 cm on image 31.  Again noted is a nodular density in the left lower lobe on image 36. Stable 1.1 cm nodule in the left lower lobe on sequence 5, image 53.  There are patchy ground-glass opacities throughout the lungs which could represent areas of air trapping and atelectasis. No acute bony abnormality.  There appears to be enlargement the left thyroid lobe.  IMPRESSION: No evidence for a large pulmonary embolism.  The study has limitations due to the patient's body habitus and poor opacification of the upper pulmonary arteries.  There is persistent mediastinal and hilar lymphadenopathy.  In addition, there are stable bilateral parenchymal nodules. These findings are nonspecific but could represent granulomatous disease, particularly sarcoidosis.  Please note that a neoplastic process cannot be completely excluded.  Recommend Pulmonary consultation for further evaluation of the lung nodules and lymphadenopathy.   Original Report  Authenticated By: Markus Daft, M.D.     Scheduled Meds:   . aspirin EC  325 mg Oral Daily  . enoxaparin (LOVENOX) injection  40 mg Subcutaneous Q24H  . furosemide  20 mg Oral Once  . furosemide  20 mg Oral BID  . hydrochlorothiazide  12.5 mg Oral Daily  . lisinopril  20 mg Oral Once  . lisinopril  20 mg Oral Daily  . losartan  100 mg Oral Once  . losartan  100 mg Oral Daily  . silver sulfADIAZINE   Topical Daily  . DISCONTD: hydrochlorothiazide  12.5 mg Oral Daily  . DISCONTD: losartan-hydrochlorothiazide  1 tablet Oral Daily  . DISCONTD: silver sulfADIAZINE  1 application Topical Daily   Continuous Infusions:   Principal Problem:  *Chest pain Active Problems:  Varicose veins of lower extremities with ulcer  Venous ulcer of leg  Venous stasis ulcers  Asthma  Hypertension  Depression    Time spent: 30    Laityn Bensen, [facility]  [facility] Pager [phone]. If 8PM-8AM, please contact night-coverage at [email or web address], password [facility] 09/03/2012, 8:51 AM  LOS: 2 days

## 2012-09-03 NOTE — Progress Notes (Signed)
  Echocardiogram 2D Echocardiogram has been performed.  Kari [name] 09/03/2012, 10:44 AM

## 2012-09-04 ENCOUNTER — Encounter (HOSPITAL_COMMUNITY): Payer: Self-pay | Admitting: General Practice

## 2012-09-04 DIAGNOSIS — F329 Major depressive disorder, single episode, unspecified: Secondary | ICD-10-CM

## 2012-09-04 LAB — CBC
HCT: 36.1 % (ref 36.0–46.0)
Hemoglobin: 11.7 g/dL — ABNORMAL LOW (ref 12.0–15.0)
MCH: 28.8 pg (ref 26.0–34.0)
MCHC: 32.4 g/dL (ref 30.0–36.0)
MCV: 88.9 fL (ref 78.0–100.0)
Platelets: 237 10*3/uL (ref 150–400)
RBC: 4.06 MIL/uL (ref 3.87–5.11)
RDW: 13.8 % (ref 11.5–15.5)
WBC: 7.1 10*3/uL (ref 4.0–10.5)

## 2012-09-04 LAB — BASIC METABOLIC PANEL
BUN: 13 mg/dL (ref 6–23)
CO2: 31 mEq/L (ref 19–32)
Calcium: 9.3 mg/dL (ref 8.4–10.5)
Chloride: 100 mEq/L (ref 96–112)
Creatinine, Ser: 0.82 mg/dL (ref 0.50–1.10)
GFR calc Af Amer: >90 mL/min (ref >90)
GFR calc non Af Amer: 82 mL/min — ABNORMAL LOW (ref >90)
Glucose, Bld: 103 mg/dL — ABNORMAL HIGH (ref 70–99)
Potassium: 3.1 mEq/L — ABNORMAL LOW (ref 3.5–5.1)
Sodium: 139 mEq/L (ref 135–145)

## 2012-09-04 MED ORDER — POTASSIUM CHLORIDE ER 10 MEQ PO CPCR: 40.0000 meq | ORAL_CAPSULE | Freq: Every day | ORAL | Status: DC

## 2012-09-04 MED ORDER — OMEPRAZOLE 40 MG PO CPDR: 40.0000 mg | DELAYED_RELEASE_CAPSULE | Freq: Every day | ORAL | Status: DC

## 2012-09-04 MED ORDER — POTASSIUM CHLORIDE CRYS ER 20 MEQ PO TBCR
60.0000 meq | EXTENDED_RELEASE_TABLET | Freq: Once | ORAL | Status: AC
  Administered 2012-09-04: 60 meq via ORAL
  Filled 2012-09-04: qty 3

## 2012-09-04 NOTE — Progress Notes (Signed)
Occupational Therapy Discharge Patient Details Name: Kari [name] MRN: [id number] DOB: May 18, 1961 Today's Date: 09/04/2012 Time:  -     Patient discharged from OT services secondary to patient discharging and states at baseline. pt declined OT evaluation. Ot attempting x3 today to provide evaluation. .  Please see latest therapy progress note for current level of functioning and progress toward goals.    Progress and discharge plan discussed with patient and/or caregiver: Patient/Caregiver agrees with plan  GO     Veneda Melter Pager: [phone]  09/04/2012, 1:18 PM

## 2012-09-04 NOTE — Discharge Summary (Signed)
Physician Discharge Summary  Kari [name] [id number] DOB: 09/07/1961 DOA: 09/01/2012  PCP: Maggie Font, MD  Admit date: 09/01/2012 Discharge date: 09/04/2012  Recommendations for Outpatient Follow-up:   PCP within one week for BMP to check potassium level and also to discuss possibility of sarcoid.  She needs follow up with pulmonology but may need a new PCP referral for this.       Discharge Diagnoses:  Principal Problem:  *Chest pain Active Problems:  Varicose veins of lower extremities with ulcer  Venous ulcer of leg  Venous stasis ulcers  Asthma  Hypertension  Depression   Discharge Condition: stable, improved  Diet recommendation: healthy heart, 2gm sodium  Wt Readings from Last 3 Encounters:  09/02/12 141.205 kg (311 lb 4.8 oz)  07/29/12 143.79 kg (317 lb)  07/08/12 145.06 kg (319 lb 12.8 oz)    History of present illness:   Kari [name] is a 51 y.o. African American female with history of hypertension, asthma, depression, morbid obesity, chronic bilateral lower extremity venostasis changes with left lower extremity ulcers currently being cared for by Dr. Donnetta Hutching, who presented today with the complaints of chest pain. Patient reports that her symptoms started about a week ago with constant nagging chest pain. Last night at approximately 11 p.m. she developed chest pain with some radiating pain to the jaw. It lasted for about 5-10 minutes and improved. She also became very nauseated and vomited. She also last week has had dyspnea on exertion. She had initially presented to the hospital in June of 2013 with similar complaints at that time atypical chest pain was felt to be due to viral etiology. She denies any recent fevers, chills, abdominal pain, diarrhea, or vision changes. Does complain of some headaches. She also did complain of some right arm pain last night which resolved.   Hospital Course:   Atypical chest pain.  Troponins remained negative. 2-D  echocardiogram shows preserved LV function, no focal wall motion abnormalities and mild biatrial enlargement. CT chest negative for PE.  Nuclear medicine stress test showed no ischemia or infarct.  Most likely patient has musculoskeletal chest pain.  Mediastinal lymph nodes are not very large but perhaps are contributing to some of her symptoms.  Will also start PPI for possible GERD.  Mediastinal LAD and right LL pulmonary nodule 57mm. Concern for possible sarcoid given appearance on CT.  - Patient has already been referred for outpatient pulmonology follow up and she should keep that appointment.   Chronic bilateral lower extremity venous stasis changes with 2 healing ulcers on posterior calf of left lower extremity.  Healing well. Continue wound care care with silvadene and wraps. Patient to followup with Dr. Donnetta Hutching for further management. Does not show any signs of infection at this time.  Arranged for home health RN to reassess.  Hypertension Stable. Continue home antihypertensive medications.   Depression  Stable. Not on any medications at this time.  Asthma  Stable. Continue albuterol inhaler as needed. Currently not in exacerbation.  Morbid obesity  Diet and exercise as outpatient.   Procedures:  NM stress test on 9/11  ECHO on 9/11  Consultations:  Cardiology  Discharge Exam: Filed Vitals:   09/04/12 0500  BP: 103/68  Pulse: 63  Temp: 97.6 F (36.4 C)  Resp:    Filed Vitals:   09/03/12 1237 09/03/12 1729 09/03/12 2100 09/04/12 0500  BP: 110/67 131/90 120/80 103/68  Pulse:   70 63  Temp:   97.9 F (36.6 C) 97.6  F (36.4 C)  TempSrc:   Oral Oral  Resp:      Height:      Weight:      SpO2:   99% 96%   General: Obese AAF, no acute distress  Head: MMM  Lungs: CTAB  Heart: RRR, no mumrur  Abdomen: Bowel sounds are normal, abdomen soft and non-tender without masses  Extremities: No edema.  Neuro: Alert and oriented X 3.  Psych: A&Ox4   Discharge  Instructions      Discharge Orders    Future Appointments: Provider: Department: Dept Phone: Center:   09/16/2012 10:15 AM Rosetta Posner, MD [facility] [phone] [facility]   09/24/2012 4:00 PM [name], MD [facility] [phone] None     Future Orders Please Complete By Expires   Diet - low sodium heart healthy      Increase activity slowly      Call MD for:      Comments:   Cal 911 if you experience chest pain with shortness of breath or symptoms of stroke, including slurred speech, facial droop, weakness of an arm or leg or confusion.   Call MD for:  temperature >100.4      Call MD for:  persistant nausea and vomiting      Call MD for:  severe uncontrolled pain      Call MD for:  difficulty breathing, headache or visual disturbances      Call MD for:  hives      Call MD for:  persistant dizziness or light-headedness      Call MD for:  extreme fatigue      Discharge instructions      Comments:   You were hospitalized with chest pain.  You had an ultrasound of your heart, which showed normal function and a stress test which showed no evidence of heart attack or heart damage.  Try taking ibuprofen or tylenol as needed for pain.  Take omeprazole daily until you follow up with your primary care doctor.  You are on two similar blood pressure medications, losartan and lisinopril.  Talk to your primary care doctor about the combination of these medications.  Also, you are on lasix which causes you to lose potassium in your urine and your potassium levels were very low.  You were given supplemental potassium and should continue oral potassium to replete or build up your potassium.  Please have your primary care doctor repeat electrolytes, including potassium, within one week to make sure you are not on too much or too little potassium.  We will arrange for a nurse to visit your house.  Finally, you have some enlarged lymph nodes in your chest which may be contributing to some chest  pain.  Please follow up with your primary care doctor and pulmonology regarding this issue.       Medication List     As of 09/04/2012 11:25 AM    TAKE these medications         albuterol 108 (90 BASE) MCG/ACT inhaler   Commonly known as: PROVENTIL HFA;VENTOLIN HFA   Inhale 1-2 puffs into the lungs every 6 (six) hours as needed for wheezing.      furosemide 20 MG tablet   Commonly known as: LASIX   Take 20 mg by mouth 2 (two) times daily.      ibuprofen 200 MG tablet   Commonly known as: ADVIL,MOTRIN   Take 200 mg by mouth every 6 (six) hours as needed. For  pain      lisinopril 20 MG tablet   Commonly known as: PRINIVIL,ZESTRIL   Take 20 mg by mouth daily.      losartan-hydrochlorothiazide 100-12.5 MG per tablet   Commonly known as: HYZAAR   Take 1 tablet by mouth daily.      MAXORB EXTRA AG+ Pads   Apply topically every other day.      omeprazole 40 MG capsule   Commonly known as: PRILOSEC   Take 1 capsule (40 mg total) by mouth daily.      potassium chloride 10 MEQ CR capsule   Commonly known as: MICRO-K   Take 4 capsules (40 mEq total) by mouth daily.      silver sulfADIAZINE 1 % cream   Commonly known as: SILVADENE   Apply 1 application topically daily. On legs        Follow-up Information    Follow up with HILL,GERALD K, MD. Schedule an appointment as soon as possible for a visit in 1 week.   Contact information:   [place] [address] [phone]           The results of significant diagnostics from this hospitalization (including imaging, microbiology, ancillary and laboratory) are listed below for reference.    Significant Diagnostic Studies: Dg Chest 2 View  09/02/2012  *RADIOLOGY REPORT*  Clinical Data: Chest pain.  Shortness of breath.  Emesis.  CHEST - 2 VIEW  Comparison: 06/2 the 8/13  Findings: Shallow inspiration.  Mild cardiac enlargement and mild pulmonary vascular congestion.  Changes are similar to previous  study.  No evidence of focal consolidation or edema.  No blunting of costophrenic angles.  No pneumothorax.  Tortuous aorta. Mediastinal contours appear intact.  IMPRESSION: Cardiac enlargement with mild pulmonary vascular congestion similar previous study.  No edema or consolidation.   Original Report Authenticated By: Neale Burly, M.D.    Ct Angio Chest Pe W/cm &/or Wo Cm  09/02/2012  *RADIOLOGY REPORT*  Clinical Data: Chest pain and cough.  CT ANGIOGRAPHY CHEST  Technique:  Multidetector CT imaging of the chest using the standard protocol during bolus administration of intravenous contrast. Multiplanar reconstructed images including MIPs were obtained and reviewed to evaluate the vascular anatomy.  Contrast: 40mL OMNIPAQUE IOHEXOL 350 MG/ML SOLN  Comparison: Chest CT 06/21/2012 chest radiograph 09/01/2012  Findings: The main pulmonary arteries and the lower lobe pulmonary arteries are patent without large filling defects.  Limited evaluation of the upper lobe pulmonary arteries due to poor contrast opacification.  Again noted is prominent chest lymphadenopathy.  Subcarinal lymph node measures 1.9 cm on sequence #4, image 38.  There is extensive right hilar lymphadenopathy. Index lymph node on image 31, sequence 4, measures 1.6 cm.  That appears to be stable left hilar lymphadenopathy.  Prominent prevascular soft tissue node measures 1.2 cm on sequence 4, image 21 and unchanged.  No significant axillary lymphadenopathy.  No significant pericardial pleural fluid.  The trachea and mainstem bronchi are patent.  Stable nodule in the right lower lobe on sequence 5, image 42, measures 7 mm.  There may be a stable nodule in the right middle lobe on image 43.  Stable nodular density along the medial right upper lung measures 0.9 cm on image 31.  Again noted is a nodular density in the left lower lobe on image 36. Stable 1.1 cm nodule in the left lower lobe on sequence 5, image 53.  There are patchy ground-glass  opacities throughout  the lungs which could represent areas of air trapping and atelectasis. No acute bony abnormality.  There appears to be enlargement the left thyroid lobe.  IMPRESSION: No evidence for a large pulmonary embolism.  The study has limitations due to the patient's body habitus and poor opacification of the upper pulmonary arteries.  There is persistent mediastinal and hilar lymphadenopathy.  In addition, there are stable bilateral parenchymal nodules. These findings are nonspecific but could represent granulomatous disease, particularly sarcoidosis.  Please note that a neoplastic process cannot be completely excluded.  Recommend Pulmonary consultation for further evaluation of the lung nodules and lymphadenopathy.   Original Report Authenticated By: Markus Daft, M.D.    Nm Myocar Multi W/spect W/wall Motion / Ef  09/03/2012  *RADIOLOGY REPORT*  Clinical Data:  If year old female with history of chest pain.  MYOCARDIAL IMAGING WITH SPECT (REST AND PHARMACOLOGIC-STRESS) GATED LEFT VENTRICULAR WALL MOTION STUDY LEFT VENTRICULAR EJECTION FRACTION - TWO DAY PROTOCOL  Technique:  Resting myocardial SPECT imaging was initially performed after intravenous administration of radiopharmaceutical. Myocardial SPECT was subsequently performed after additional radiopharmaceutical injection during pharmacologic-stress supervised by the Cardiology staff.  Quantitative gated imaging was also performed to evaluate left ventricular wall motion, and estimate left ventricular ejection fraction.  Radiopharmaceutical:  Tc-74m cardiolite 19mCi at rest and 43mCi during stress.  Comparison: none  Findings:  Technique: Study is adequate.  Perfusion:  There are no relative decreased counts on stress or rest to suggest reversible ischemia or infarction.  Wall motion:  No focal wall motion abnormality.  Normal contractility.  Left ventricular ejection fraction:  Calculated left ventricular ejection fraction =  54%  IMPRESSION:  1.   No reversible ischemia or infarction. 2.  Normal wall motion. 3.  Left ventricular ejection fraction equal 54.  The%   Original Report Authenticated By: Suzy Bouchard, M.D.     Microbiology: No results found for this or any previous visit (from the past 240 hour(s)).   Labs: Basic Metabolic Panel:  Lab [id number] 0530 09/03/12 0550 09/01/12 2318  NA 139 138 138  K 3.1* 3.5 3.6  CL 100 102 101  CO2 31 28 28   GLUCOSE 103* 94 112*  BUN 13 14 15   CREATININE 0.82 0.89 0.84  CALCIUM 9.3 9.1 9.7  MG -- -- --  PHOS -- -- --   Liver Function Tests:  Lab 09/01/12 2318  AST 14  ALT 10  ALKPHOS 59  BILITOT 0.4  PROT 8.0  ALBUMIN 3.5   No results found for this basename: LIPASE:5,AMYLASE:5 in the last 168 hours No results found for this basename: AMMONIA:5 in the last 168 hours CBC:  Lab 09/04/12 0530 09/03/12 0550 09/01/12 2318  WBC 7.1 6.6 8.8  NEUTROABS -- -- 6.2  HGB 11.7* 11.0* 12.3  HCT 36.1 35.2* 37.9  MCV 88.9 90.0 89.8  PLT 237 221 259   Cardiac Enzymes:  Lab 09/03/12 0555 09/02/12 2242 09/02/12 1503  CKTOTAL -- -- --  CKMB -- -- --  CKMBINDEX -- -- --  TROPONINI <0.30 <0.30 <0.30   BNP: BNP (last 3 results)  Basename 09/01/12 2319 06/21/12 0950  PROBNP 70.3 415.7*   CBG: No results found for this basename: GLUCAP:5 in the last 168 hours  Time coordinating discharge:  45 minutes  Signed:  Kayliee Atienza  [facility] 09/04/2012, 11:25 AM

## 2012-09-04 NOTE — Progress Notes (Signed)
Results of nuclear stress test noted.  Patient had a chemical Veterinary surgeon) Cardiolyte performed.  Occasional PVC's and ventricular triplets noted on lexiscan portion of study.  No inducible ischemia and normal LVF noted on nuclear images.  Patient has had negative cardiac enzymes in setting of atypical chest pain and no ischemia on Lexiscan Cardiolyte.  No further cardiac workup at this time.

## 2012-09-04 NOTE — Progress Notes (Signed)
Dressing change to left lower extremity complete prior to d/c Discharge instructions given to and reviewed with patient. patient in no active distress and is awaiting ride to come

## 2012-09-04 NOTE — Progress Notes (Signed)
PT Cancellation and Discharge Note  Treatment cancelled today due to attempted PT eval x3 today and now pt noting she feels she is at baseline and is leaving to go home.  Will sign off.    [name], [place] 09/04/2012, 1:26 PM

## 2012-09-15 ENCOUNTER — Encounter: Payer: Self-pay | Admitting: Vascular Surgery

## 2012-09-16 ENCOUNTER — Encounter: Payer: Self-pay | Admitting: Vascular Surgery

## 2012-09-16 ENCOUNTER — Ambulatory Visit (INDEPENDENT_AMBULATORY_CARE_PROVIDER_SITE_OTHER): Payer: Medicaid Other | Admitting: Vascular Surgery

## 2012-09-16 VITALS — BP 144/84 | HR 75 | Temp 98.4°F | Ht 67.0 in | Wt 314.0 lb

## 2012-09-16 DIAGNOSIS — I872 Venous insufficiency (chronic) (peripheral): Secondary | ICD-10-CM

## 2012-09-16 DIAGNOSIS — Z01810 Encounter for preprocedural cardiovascular examination: Secondary | ICD-10-CM

## 2012-09-16 DIAGNOSIS — L97909 Non-pressure chronic ulcer of unspecified part of unspecified lower leg with unspecified severity: Secondary | ICD-10-CM

## 2012-09-16 DIAGNOSIS — I83009 Varicose veins of unspecified lower extremity with ulcer of unspecified site: Secondary | ICD-10-CM

## 2012-09-16 NOTE — Progress Notes (Signed)
The patient is here today for followup of her bilateral venous stasis disease. She's been unable to wear her face compressions and she is unable to put these on has not been using Silvadene. She's had a new pretibial ulceration on the right she reports this due to some lotion and she was receiving pedicure. She's had no progression of the left posterior calf ulceration. She does have marked pitting edema bilaterally. I again explained the critical importance of elevation and the main issue regarding compression. She wishes to change to undergo treatment and understands this is a weekly placement bilaterally. She will have was placed today and I will see her again in one month

## 2012-09-16 NOTE — Addendum Note (Signed)
Addended by: Mena Goes on: 09/16/2012 10:50 AM   Modules accepted: Orders

## 2012-09-22 ENCOUNTER — Encounter: Payer: Self-pay | Admitting: Vascular Surgery

## 2012-09-23 ENCOUNTER — Ambulatory Visit (INDEPENDENT_AMBULATORY_CARE_PROVIDER_SITE_OTHER): Payer: Medicaid Other | Admitting: *Deleted

## 2012-09-23 DIAGNOSIS — L97909 Non-pressure chronic ulcer of unspecified part of unspecified lower leg with unspecified severity: Secondary | ICD-10-CM

## 2012-09-23 DIAGNOSIS — I872 Venous insufficiency (chronic) (peripheral): Secondary | ICD-10-CM

## 2012-09-23 DIAGNOSIS — I83009 Varicose veins of unspecified lower extremity with ulcer of unspecified site: Secondary | ICD-10-CM

## 2012-09-23 NOTE — Progress Notes (Signed)
Took photos of bilateral venous stasis ulcers (calves).  Appear to be filling in and scabbing over this week.  Mrs. Wyble states that her legs (calves) feel much better this week.  Applied bilateral [employer].  Reminded Mrs. Biers of weekly bilateral  [employer] application next week on 09-30-2012.  Demaris Leavell, Meliton Rattan

## 2012-09-24 ENCOUNTER — Inpatient Hospital Stay: Payer: Medicaid Other | Admitting: Pulmonary Disease

## 2012-09-29 ENCOUNTER — Encounter: Payer: Self-pay | Admitting: Neurosurgery

## 2012-09-30 ENCOUNTER — Ambulatory Visit (INDEPENDENT_AMBULATORY_CARE_PROVIDER_SITE_OTHER): Payer: Medicaid Other | Admitting: *Deleted

## 2012-09-30 DIAGNOSIS — I83009 Varicose veins of unspecified lower extremity with ulcer of unspecified site: Secondary | ICD-10-CM

## 2012-09-30 DIAGNOSIS — I7025 Atherosclerosis of native arteries of other extremities with ulceration: Secondary | ICD-10-CM | POA: Insufficient documentation

## 2012-09-30 DIAGNOSIS — I739 Peripheral vascular disease, unspecified: Secondary | ICD-10-CM | POA: Insufficient documentation

## 2012-09-30 DIAGNOSIS — I872 Venous insufficiency (chronic) (peripheral): Secondary | ICD-10-CM

## 2012-09-30 NOTE — Progress Notes (Signed)
B/L Unna boot applied. Left leg wound healing well, no drainage present.  Right leg has slight exudate.

## 2012-10-06 ENCOUNTER — Encounter: Payer: Self-pay | Admitting: Neurosurgery

## 2012-10-07 ENCOUNTER — Ambulatory Visit (INDEPENDENT_AMBULATORY_CARE_PROVIDER_SITE_OTHER): Payer: Medicaid Other

## 2012-10-07 VITALS — BP 132/85 | HR 65 | Resp 16 | Ht 70.0 in | Wt 307.4 lb

## 2012-10-07 DIAGNOSIS — I83009 Varicose veins of unspecified lower extremity with ulcer of unspecified site: Secondary | ICD-10-CM

## 2012-10-07 DIAGNOSIS — I872 Venous insufficiency (chronic) (peripheral): Secondary | ICD-10-CM

## 2012-10-07 DIAGNOSIS — I739 Peripheral vascular disease, unspecified: Secondary | ICD-10-CM

## 2012-10-07 NOTE — Progress Notes (Signed)
Ms C. Kari [name] was seen today for her forth Rolena Infante change of bilateral lower legs.  Wound on right lower leg healing slow. Wound on left posterior leg, serous drainage.  Patient complains of her removal of dressing prior to appointment time, states " it peels  Skin off when she removes unna boot" .  Bilateral unna boots were applied with no difficulty and Ms Holtzer will return in one week for dressing change and to see Dr. Donnetta Hutching.     Suhaib Guzzo Eldridge-Lewis, RMA

## 2012-10-13 ENCOUNTER — Encounter: Payer: Self-pay | Admitting: Neurosurgery

## 2012-10-14 ENCOUNTER — Encounter: Payer: Medicaid Other | Admitting: Neurosurgery

## 2012-10-16 ENCOUNTER — Encounter: Payer: Self-pay | Admitting: Pulmonary Disease

## 2012-10-16 ENCOUNTER — Ambulatory Visit (INDEPENDENT_AMBULATORY_CARE_PROVIDER_SITE_OTHER): Payer: Medicaid Other | Admitting: Pulmonary Disease

## 2012-10-16 VITALS — BP 122/88 | HR 71 | Temp 98.4°F | Ht 67.0 in | Wt 309.8 lb

## 2012-10-16 DIAGNOSIS — R590 Localized enlarged lymph nodes: Secondary | ICD-10-CM

## 2012-10-16 DIAGNOSIS — R599 Enlarged lymph nodes, unspecified: Secondary | ICD-10-CM

## 2012-10-16 DIAGNOSIS — R918 Other nonspecific abnormal finding of lung field: Secondary | ICD-10-CM

## 2012-10-16 NOTE — Patient Instructions (Addendum)
Will setup for breathing studies, and will call you with the results. Will schedule for followup ct scan in 108mos to look again at your lymph nodes and spots on lungs.  Let me know if you change your mind about biopsy. Will arrange followup with me after your ct scan in 39mos.

## 2012-10-16 NOTE — Assessment & Plan Note (Signed)
The patient has multiple small pulmonary nodules, and more than likely these are inflammatory and related to possible sarcoid.  She has never smoked, and tells me she has no personal history of cancer.  These can be followed over time.

## 2012-10-16 NOTE — Progress Notes (Signed)
  Subjective:    Patient ID: Kari [name], female    DOB: Apr 08, 1961, 51 y.o.   MRN: [id number]  HPI The patient is a 51 year old female who I have been asked to see for an abnormal CT chest.  She was admitted to the hospital in September of this year with atypical chest pain, and was found to have an unremarkable echocardiogram and stress test.  Myocardial infarction was excluded, and a CT chest showed no pulmonary emboli.  The chest CT did show moderate mediastinal and hilar lymphadenopathy, as well as a few small nodules bilaterally.  The patient was sent here for further evaluation.  She currently denies any issues with shortness of breath, and her chest discomfort has totally resolved.  She has very little cough and no mucus, and denies any abnormal rashes.  She has no family history for sarcoidosis.   Review of Systems  Constitutional: Positive for unexpected weight change. Negative for fever.  HENT: Positive for congestion, sore throat and rhinorrhea. Negative for ear pain, nosebleeds, sneezing, trouble swallowing, dental problem, postnasal drip and sinus pressure.   Eyes: Negative for redness and itching.  Respiratory: Positive for cough. Negative for chest tightness, shortness of breath and wheezing.   Cardiovascular: Positive for leg swelling. Negative for palpitations.  Gastrointestinal: Negative for nausea and vomiting.  Genitourinary: Negative for dysuria.  Musculoskeletal: Positive for joint swelling.  Skin: Negative for rash.  Neurological: Negative for headaches.  Hematological: Bruises/bleeds easily.  Psychiatric/Behavioral: Positive for dysphoric mood. The patient is nervous/anxious.        Objective:   Physical Exam Constitutional:  Morbidly obese female, no acute distress  HENT:  Nares patent without discharge  Oropharynx without exudate, palate and uvula are elongated.  Eyes:  Perrla, eomi, no scleral icterus  Neck:  No JVD, no TMG  Cardiovascular:  Normal  rate, regular rhythm, no rubs or gallops.  2/6 sem        Intact distal pulses  Pulmonary :  Normal breath sounds, no stridor or respiratory distress   No rales, rhonchi, or wheezing  Abdominal:  Soft, nondistended, bowel sounds present.  No tenderness noted.   Musculoskeletal:  2+ lower extremity edema noted.  Lymph Nodes:  No cervical lymphadenopathy noted  Skin:  No cyanosis noted  Neurologic:  Alert, appropriate, moves all 4 extremities without obvious deficit.         Assessment & Plan:

## 2012-10-16 NOTE — Assessment & Plan Note (Addendum)
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  There is nothing to suggest by history or prior evaluation a diagnosis of lymphoma.  I have had a long discussion with the patient about the management of mediastinal lymphadenopathy, including observation and also biopsy.  I have discussed with her bronchoscopy with endobronchial ultrasound, as well as mediastinoscopy.  She understands that if we did not biopsy the lymph nodes, she will need to be followed up over time.  She also understands that we cannot 100% exclude the possibility of lymphoma.  At this time, the patient wishes to do surveillance, and states "I don't want to be cut on".  Even if the patient does have sarcoidosis, she is not symptomatic enough to warrant treatment, and therefore I wouldn't do surveillance anyway.  I would like to get baseline pulmonary function studies so that she can be followed going forward.

## 2012-10-20 ENCOUNTER — Encounter: Payer: Self-pay | Admitting: Vascular Surgery

## 2012-10-21 ENCOUNTER — Ambulatory Visit: Payer: Medicaid Other | Admitting: Vascular Surgery

## 2012-10-27 ENCOUNTER — Encounter: Payer: Self-pay | Admitting: Vascular Surgery

## 2012-10-28 ENCOUNTER — Ambulatory Visit: Payer: Medicaid Other | Admitting: Vascular Surgery

## 2012-10-28 ENCOUNTER — Encounter: Payer: Self-pay | Admitting: Vascular Surgery

## 2012-10-28 ENCOUNTER — Encounter (INDEPENDENT_AMBULATORY_CARE_PROVIDER_SITE_OTHER): Payer: Medicaid Other | Admitting: *Deleted

## 2012-10-28 ENCOUNTER — Ambulatory Visit (INDEPENDENT_AMBULATORY_CARE_PROVIDER_SITE_OTHER): Payer: Medicaid Other | Admitting: Vascular Surgery

## 2012-10-28 VITALS — BP 135/62 | HR 74 | Ht 67.0 in | Wt 309.0 lb

## 2012-10-28 DIAGNOSIS — I872 Venous insufficiency (chronic) (peripheral): Secondary | ICD-10-CM

## 2012-10-28 DIAGNOSIS — I83009 Varicose veins of unspecified lower extremity with ulcer of unspecified site: Secondary | ICD-10-CM

## 2012-10-28 DIAGNOSIS — Z01810 Encounter for preprocedural cardiovascular examination: Secondary | ICD-10-CM

## 2012-10-28 DIAGNOSIS — L97909 Non-pressure chronic ulcer of unspecified part of unspecified lower leg with unspecified severity: Secondary | ICD-10-CM

## 2012-10-28 NOTE — Progress Notes (Signed)
Patient is here today for continued followup of bilateral venous stasis ulceration and venous hypertension. He has been doing better with an Haematologist treatment. She did miss last week due to illness and her family. Her right leg has markedly less swelling and eschar and good healing of the pretibial ulcer. On the left she does have a markedly contracted size and much less erythema in the posterior calf ulcer. Again I stressed the importance of elevation and compression. She did undergo bilateral venous duplex today to determine if she has any correctable cause of venous hypertension. This reveals severe deep reflux bilaterally. There is no superficial reflux in the left and mild reflux in the proximal right saphenous vein. I recommended continued into the treatment of a weekly basis I will see her again in 6 weeks

## 2012-11-04 ENCOUNTER — Ambulatory Visit (INDEPENDENT_AMBULATORY_CARE_PROVIDER_SITE_OTHER): Payer: Medicaid Other | Admitting: *Deleted

## 2012-11-04 DIAGNOSIS — L97909 Non-pressure chronic ulcer of unspecified part of unspecified lower leg with unspecified severity: Secondary | ICD-10-CM

## 2012-11-04 DIAGNOSIS — I83009 Varicose veins of unspecified lower extremity with ulcer of unspecified site: Secondary | ICD-10-CM

## 2012-11-04 NOTE — Progress Notes (Signed)
Kari [name] presents today for bilateral [facility] application.  Observed new ulceration right calf( pretibial right lateral aspect of calf).  Kari [name] states that when she removed the old unna boot (and moistened it prior to removing it) an old scab was pulled off creating the new ulceration in her right calf.  She is complaining of increased pain in the area of the ulcerations.  Recommended Ibuprofen 600 mg with food every 6 hours prn pain.  Also recommended keeping both legs elevated when sitting.  Kari [name] states she has noticed decreased swelling in both calves.  Applied bilateral [employer].  Confirmed weekly Rolena Infante appointments with Ms. Demetro.   Norberto Sorenson, RN

## 2012-11-11 ENCOUNTER — Ambulatory Visit (INDEPENDENT_AMBULATORY_CARE_PROVIDER_SITE_OTHER): Payer: Medicaid Other

## 2012-11-11 DIAGNOSIS — I83009 Varicose veins of unspecified lower extremity with ulcer of unspecified site: Secondary | ICD-10-CM

## 2012-11-11 DIAGNOSIS — L98499 Non-pressure chronic ulcer of skin of other sites with unspecified severity: Secondary | ICD-10-CM

## 2012-11-11 DIAGNOSIS — I739 Peripheral vascular disease, unspecified: Secondary | ICD-10-CM

## 2012-11-11 NOTE — Progress Notes (Signed)
Ms C. Naiomy Percy was seen today for an [employer] change of bilateral legs.  Wound on right lower leg has good healing and patient "did not want an unna boot placed on RIGHT leg" .  Left lower posterior calf ulcer is healing slow.  Rolena Infante was applied to the Left leg only with no difficulty.  Patient will return in one week for her third treatment.  Appointment may vary because of Thanksgiving Holiday.      Floria Brandau Eldridge-Lewis, RMA

## 2012-11-18 ENCOUNTER — Encounter: Payer: Medicaid Other | Admitting: Neurosurgery

## 2012-11-18 ENCOUNTER — Encounter: Payer: Self-pay | Admitting: Neurosurgery

## 2012-11-19 ENCOUNTER — Encounter: Payer: Medicaid Other | Admitting: Neurosurgery

## 2012-11-24 ENCOUNTER — Encounter: Payer: Self-pay | Admitting: Neurosurgery

## 2012-11-25 ENCOUNTER — Encounter: Payer: Medicaid Other | Admitting: Neurosurgery

## 2012-12-01 ENCOUNTER — Encounter: Payer: Self-pay | Admitting: Vascular Surgery

## 2012-12-02 ENCOUNTER — Encounter: Payer: Medicaid Other | Admitting: Vascular Surgery

## 2012-12-02 ENCOUNTER — Encounter: Payer: Medicaid Other | Admitting: Neurosurgery

## 2012-12-09 ENCOUNTER — Ambulatory Visit: Payer: Medicaid Other | Admitting: Vascular Surgery

## 2013-01-05 ENCOUNTER — Ambulatory Visit (INDEPENDENT_AMBULATORY_CARE_PROVIDER_SITE_OTHER): Payer: Medicaid Other | Admitting: Cardiology

## 2013-01-05 ENCOUNTER — Encounter: Payer: Self-pay | Admitting: Cardiology

## 2013-01-05 VITALS — BP 128/81 | HR 74 | Ht 67.0 in | Wt 319.0 lb

## 2013-01-05 DIAGNOSIS — I10 Essential (primary) hypertension: Secondary | ICD-10-CM

## 2013-01-05 DIAGNOSIS — I83009 Varicose veins of unspecified lower extremity with ulcer of unspecified site: Secondary | ICD-10-CM

## 2013-01-05 DIAGNOSIS — R079 Chest pain, unspecified: Secondary | ICD-10-CM

## 2013-01-05 NOTE — Progress Notes (Signed)
HPI: 52 year old female for evaluation of chest pain. Admitted with chest pain in September of 2013. Enzymes negative. Echocardiogram in September of 2013 showed an ejection fraction of 50-55% and mild biatrial enlargement. Myoview in September of 2013 showed an ejection fraction of 54% and no ischemia or infarction. Chest CTA in September of 2013 showed no pulmonary embolus. There is persistent mediastinal and hilar lymphadenopathy and parenchymal nodules. At the time of her admission she states her chest pain was associated with "stress". She has had no symptoms since then. She has dyspnea with more moderate activities but not routine activities. No orthopnea or PND. No exertional chest pain or syncope. Chronic pedal edema from venous insufficiency.  Current Outpatient Prescriptions  Medication Sig Dispense Refill  . albuterol (PROVENTIL HFA;VENTOLIN HFA) 108 (90 BASE) MCG/ACT inhaler Inhale 1-2 puffs into the lungs every 6 (six) hours as needed for wheezing.  1 Inhaler  0  . furosemide (LASIX) 20 MG tablet Take 20 mg by mouth 2 (two) times daily.      Marland Kitchen ibuprofen (ADVIL,MOTRIN) 200 MG tablet Take 200 mg by mouth every 6 (six) hours as needed. For pain      . lisinopril (PRINIVIL,ZESTRIL) 20 MG tablet Take 20 mg by mouth daily.      Marland Kitchen losartan-hydrochlorothiazide (HYZAAR) 100-12.5 MG per tablet Take 1 tablet by mouth daily.      Marland Kitchen omeprazole (PRILOSEC) 40 MG capsule Take 1 capsule (40 mg total) by mouth daily.  30 capsule  1  . potassium chloride (MICRO-K) 10 MEQ CR capsule Take 4 capsules (40 mEq total) by mouth daily.  30 capsule  0  . silver sulfADIAZINE (SILVADENE) 1 % cream Apply 1 application topically daily. On legs      . Wound Dressings (MAXORB EXTRA AG+) PADS Apply topically every other day.        No Known Allergies  Past Medical History  Diagnosis Date  . Hypertension   . Depression   . Asthma   . Ulcer     left posterior calf  . Headache   . Hyperlipidemia   . Lung nodules      Past Surgical History  Procedure Date  . No past surgeries     History   Social History  . Marital Status: Single    Spouse Name: N/A    Number of Children: 1  . Years of Education: N/A   Occupational History  . Not on file.   Social History Main Topics  . Smoking status: Never Smoker   . Smokeless tobacco: Never Used  . Alcohol Use: No  . Drug Use: No  . Sexually Active: Not Currently   Other Topics Concern  . Not on file   Social History Narrative  . No narrative on file    Family History  Problem Relation Age of Onset  . Heart failure Mother   . Heart disease Father     ROS: no fevers or chills, productive cough, hemoptysis, dysphasia, odynophagia, melena, hematochezia, dysuria, hematuria, rash, seizure activity, orthopnea, PND, claudication. Remaining systems are negative.  Physical Exam:   Blood pressure 128/81, pulse 74, height 5\' 7"  (1.702 m), weight 319 lb (144.697 kg), last menstrual period 11/21/2011.  General:  Well developed/obese in NAD Skin warm/dry Patient not depressed No peripheral clubbing Back-normal HEENT-normal/normal eyelids Neck supple/normal carotid upstroke bilaterally; no bruits; no JVD; no thyromegaly chest - CTA/ normal expansion CV - RRR/normal S1 and S2; no rubs or gallops;  PMI nondisplaced, 1/6 systolic  murmur LSB Abdomen -NT/ND, no HSM, no mass, + bowel sounds, no bruit 2+ femoral pulses, no bruits Ext-1-2+edema, no chords, lower extremities wrapped. Neuro-grossly nonfocal  ECG  Sinus rhythm at a rate of 74. Left ventricular hypertrophy. Anterior infarct. Inferior infarct

## 2013-01-05 NOTE — Patient Instructions (Addendum)
Your physician recommends that you schedule a follow-up appointment in: AS NEEDED  

## 2013-01-05 NOTE — Assessment & Plan Note (Signed)
Blood pressure controlled. Continue present medications. 

## 2013-01-05 NOTE — Assessment & Plan Note (Signed)
Symptoms were atypical in September. Workup including CTA, myoview and echocardiogram unrevealing. She has had no chest pain since then. I do not think further cardiac workup is indicated.

## 2013-01-05 NOTE — Assessment & Plan Note (Signed)
Management per vascular surgery. 

## 2013-01-10 ENCOUNTER — Encounter (HOSPITAL_COMMUNITY): Payer: Self-pay | Admitting: Emergency Medicine

## 2013-01-10 ENCOUNTER — Emergency Department (HOSPITAL_COMMUNITY)
Admission: EM | Admit: 2013-01-10 | Discharge: 2013-01-10 | Disposition: A | Discharge status: Home Health | Payer: Medicaid Other | Attending: Emergency Medicine | Admitting: Emergency Medicine

## 2013-01-10 DIAGNOSIS — Z8709 Personal history of other diseases of the respiratory system: Secondary | ICD-10-CM | POA: Insufficient documentation

## 2013-01-10 DIAGNOSIS — L97909 Non-pressure chronic ulcer of unspecified part of unspecified lower leg with unspecified severity: Secondary | ICD-10-CM

## 2013-01-10 DIAGNOSIS — Z8659 Personal history of other mental and behavioral disorders: Secondary | ICD-10-CM | POA: Insufficient documentation

## 2013-01-10 DIAGNOSIS — Z862 Personal history of diseases of the blood and blood-forming organs and certain disorders involving the immune mechanism: Secondary | ICD-10-CM | POA: Insufficient documentation

## 2013-01-10 DIAGNOSIS — I8310 Varicose veins of unspecified lower extremity with inflammation: Secondary | ICD-10-CM | POA: Insufficient documentation

## 2013-01-10 DIAGNOSIS — Y939 Activity, unspecified: Secondary | ICD-10-CM | POA: Insufficient documentation

## 2013-01-10 DIAGNOSIS — Y929 Unspecified place or not applicable: Secondary | ICD-10-CM | POA: Insufficient documentation

## 2013-01-10 DIAGNOSIS — IMO0002 Reserved for concepts with insufficient information to code with codable children: Secondary | ICD-10-CM | POA: Insufficient documentation

## 2013-01-10 DIAGNOSIS — Z8639 Personal history of other endocrine, nutritional and metabolic disease: Secondary | ICD-10-CM | POA: Insufficient documentation

## 2013-01-10 DIAGNOSIS — I83009 Varicose veins of unspecified lower extremity with ulcer of unspecified site: Secondary | ICD-10-CM

## 2013-01-10 DIAGNOSIS — I10 Essential (primary) hypertension: Secondary | ICD-10-CM | POA: Insufficient documentation

## 2013-01-10 DIAGNOSIS — J45909 Unspecified asthma, uncomplicated: Secondary | ICD-10-CM | POA: Insufficient documentation

## 2013-01-10 DIAGNOSIS — Z79899 Other long term (current) drug therapy: Secondary | ICD-10-CM | POA: Insufficient documentation

## 2013-01-10 DIAGNOSIS — I872 Venous insufficiency (chronic) (peripheral): Secondary | ICD-10-CM

## 2013-01-10 MED ORDER — SULFAMETHOXAZOLE-TRIMETHOPRIM 800-160 MG PO TABS: 1.0000 | ORAL_TABLET | Freq: Two times a day (BID) | ORAL | Status: DC

## 2013-01-10 MED ORDER — TRAMADOL HCL 50 MG PO TABS: 50.0000 mg | ORAL_TABLET | Freq: Four times a day (QID) | ORAL | Status: DC | PRN

## 2013-01-10 MED ORDER — SILVER SULFADIAZINE 1 % EX CREA: TOPICAL_CREAM | Freq: Two times a day (BID) | CUTANEOUS | Status: DC

## 2013-01-10 MED ORDER — SILVER SULFADIAZINE 1 % EX CREA
TOPICAL_CREAM | Freq: Once | CUTANEOUS | Status: AC
  Administered 2013-01-10: 17:00:00 via TOPICAL
  Filled 2013-01-10: qty 85

## 2013-01-10 NOTE — ED Notes (Signed)
Patient discharged to home with family. NAD.  

## 2013-01-10 NOTE — ED Notes (Signed)
Right lower leg wrapped after cream applied.

## 2013-01-10 NOTE — ED Notes (Signed)
Pt with stasis ulcers to bilateral LE that she hit and opened up the right today; pt c/o increased pain and draining

## 2013-01-10 NOTE — ED Provider Notes (Signed)
History     CSN: [id number]  Arrival date & time 01/10/13  1449   First MD Initiated Contact with Patient 01/10/13 [phone]      Chief Complaint  Patient presents with  . Leg Pain    (Consider location/radiation/quality/duration/timing/severity/associated sxs/prior treatment) HPI Comments: Patient comes to the ER for evaluation of injury to her leg. Patient reports that she has chronic stasis ulcers in her legs and she sees a vascular specialist for this. She says today she hit her right leg on the bed today and it started to drain. She has run out of her Silvadene cream. She also thinks she might need some antibiotics. She has not had any fever. There is no increased redness.  Patient is a 52 y.o. female presenting with leg pain.  Leg Pain     Past Medical History  Diagnosis Date  . Hypertension   . Depression   . Asthma   . Ulcer     left posterior calf  . Headache   . Hyperlipidemia   . Lung nodules     Past Surgical History  Procedure Date  . No past surgeries     Family History  Problem Relation Age of Onset  . Heart failure Mother   . Heart disease Father     History  Substance Use Topics  . Smoking status: Never Smoker   . Smokeless tobacco: Never Used  . Alcohol Use: No    OB History    Grav Para Term Preterm Abortions TAB SAB Ect Mult Living                  Review of Systems  Constitutional: Negative for fever.  Skin: Positive for wound.  All other systems reviewed and are negative.    Allergies  Review of patient's allergies indicates no known allergies.  Home Medications   Current Outpatient Rx  Name  Route  Sig  Dispense  Refill  . ALBUTEROL SULFATE HFA 108 (90 BASE) MCG/ACT IN AERS   Inhalation   Inhale 1-2 puffs into the lungs every 6 (six) hours as needed for wheezing.   1 Inhaler   0   . FUROSEMIDE 20 MG PO TABS   Oral   Take 20 mg by mouth 2 (two) times daily.         . IBUPROFEN 200 MG PO TABS   Oral   Take 200 mg by  mouth every 6 (six) hours as needed. For pain         . LISINOPRIL 20 MG PO TABS   Oral   Take 20 mg by mouth daily.         Marland Kitchen LOSARTAN POTASSIUM-HCTZ 100-12.5 MG PO TABS   Oral   Take 1 tablet by mouth daily.         Marland Kitchen OMEPRAZOLE 40 MG PO CPDR   Oral   Take 1 capsule (40 mg total) by mouth daily.   30 capsule   1   . POTASSIUM CHLORIDE ER 10 MEQ PO CPCR   Oral   Take 4 capsules (40 mEq total) by mouth daily.   30 capsule   0   . SILVER SULFADIAZINE 1 % EX CREA   Topical   Apply 1 application topically daily. On legs         . MAXORB EXTRA AG+ EX PADS   Apply externally   Apply topically every other day.           BP  153/94  Pulse 108  Temp 97.7 F (36.5 C) (Oral)  Resp 18  SpO2 98%  LMP 11/21/2011  Physical Exam  Constitutional: She is oriented to person, place, and time. She appears well-developed and well-nourished. No distress.  HENT:  Head: Normocephalic and atraumatic.  Right Ear: Hearing normal.  Nose: Nose normal.  Mouth/Throat: Oropharynx is clear and moist and mucous membranes are normal.  Eyes: Conjunctivae normal and EOM are normal. Pupils are equal, round, and reactive to light.  Neck: Normal range of motion. Neck supple.  Cardiovascular: Normal rate, regular rhythm, S1 normal and S2 normal.  Exam reveals no gallop and no friction rub.   No murmur heard. Pulmonary/Chest: Effort normal and breath sounds normal. No respiratory distress. She exhibits no tenderness.  Abdominal: Soft. Normal appearance and bowel sounds are normal. There is no hepatosplenomegaly. There is no tenderness. There is no rebound, no guarding, no tenderness at McBurney's point and negative Murphy's sign. No hernia.  Musculoskeletal: Normal range of motion. She exhibits edema.  Neurological: She is alert and oriented to person, place, and time. She has normal strength. No cranial nerve deficit or sensory deficit. Coordination normal. GCS eye subscore is 4. GCS verbal  subscore is 5. GCS motor subscore is 6.  Skin: Skin is warm, dry and intact. No rash noted. No cyanosis.       Bilateral lower extremities edematous, thickened consistent with stasis dermatitis. Several areas of ulceration, approximately 1 cm in diameter both lower extremities. no drainage. No fluctuance.  Psychiatric: She has a normal mood and affect. Her speech is normal and behavior is normal. Thought content normal.    ED Course  Procedures (including critical care time)  Labs Reviewed - No data to display No results found.   Diagnosis: 1. Venous stasis ulcer 2. Stasis dermatitis   MDM  Patient is here because the area that she bumped on the bed hurts. She has significant stasis dermatitis in bilateral lower extremities with chronic ulcerations and dermatitis. There has not been any significant change in the legs, although she reports some drainage from the area that she bumped on the bed. Currently I do not see any purulence. There is no increased warmth. I do not feel like she needs any workup at this time. We'll restart the Silvadene and oral Bactrim. Followup with her specialist this week in the office. Return if symptoms worsen.       Orpah Greek, MD 01/10/13 [phone]

## 2013-01-20 ENCOUNTER — Telehealth: Payer: Self-pay | Admitting: Cardiology

## 2013-01-20 NOTE — Telephone Encounter (Signed)
New Problem    Saw Dr. Stanford Breed & needs papers stating she was released from care  Attention: Kari [name]  Pt will be calling back to add fax number

## 2013-01-20 NOTE — Telephone Encounter (Signed)
[place] to F/U call    Fax Number [phone]

## 2013-01-20 NOTE — Telephone Encounter (Signed)
Last office note faxed to the number provided. Left message for pt to call if anything else needed.

## 2013-01-27 ENCOUNTER — Telehealth: Payer: Self-pay | Admitting: Cardiology

## 2013-01-27 NOTE — Telephone Encounter (Signed)
Left message for pt, last office note faxed to the number provided.

## 2013-01-27 NOTE — Telephone Encounter (Signed)
New problem:   Patient calling checking on the Status of letter to fax today . Fax # [phone].    Attention Alvy Beal .

## 2013-04-09 ENCOUNTER — Other Ambulatory Visit: Payer: Medicaid Other

## 2013-04-16 ENCOUNTER — Ambulatory Visit: Payer: Medicaid Other | Admitting: Pulmonary Disease

## 2013-06-02 ENCOUNTER — Emergency Department (HOSPITAL_COMMUNITY)
Admission: EM | Admit: 2013-06-02 | Discharge: 2013-06-02 | Disposition: A | Discharge status: Home / Self Care | Payer: Medicaid Other | Attending: Emergency Medicine | Admitting: Emergency Medicine

## 2013-06-02 ENCOUNTER — Encounter (HOSPITAL_COMMUNITY): Payer: Self-pay | Admitting: Emergency Medicine

## 2013-06-02 DIAGNOSIS — J45909 Unspecified asthma, uncomplicated: Secondary | ICD-10-CM | POA: Insufficient documentation

## 2013-06-02 DIAGNOSIS — Z8679 Personal history of other diseases of the circulatory system: Secondary | ICD-10-CM | POA: Insufficient documentation

## 2013-06-02 DIAGNOSIS — E785 Hyperlipidemia, unspecified: Secondary | ICD-10-CM | POA: Insufficient documentation

## 2013-06-02 DIAGNOSIS — Z8709 Personal history of other diseases of the respiratory system: Secondary | ICD-10-CM | POA: Insufficient documentation

## 2013-06-02 DIAGNOSIS — I10 Essential (primary) hypertension: Secondary | ICD-10-CM | POA: Insufficient documentation

## 2013-06-02 DIAGNOSIS — I872 Venous insufficiency (chronic) (peripheral): Secondary | ICD-10-CM | POA: Insufficient documentation

## 2013-06-02 DIAGNOSIS — L97809 Non-pressure chronic ulcer of other part of unspecified lower leg with unspecified severity: Secondary | ICD-10-CM | POA: Insufficient documentation

## 2013-06-02 DIAGNOSIS — I83019 Varicose veins of right lower extremity with ulcer of unspecified site: Secondary | ICD-10-CM

## 2013-06-02 DIAGNOSIS — Z79899 Other long term (current) drug therapy: Secondary | ICD-10-CM | POA: Insufficient documentation

## 2013-06-02 DIAGNOSIS — Z8659 Personal history of other mental and behavioral disorders: Secondary | ICD-10-CM | POA: Insufficient documentation

## 2013-06-02 LAB — CBC WITH DIFFERENTIAL/PLATELET
Basophils Absolute: 0 10*3/uL (ref 0.0–0.1)
Basophils Relative: 0 % (ref 0–1)
Eosinophils Absolute: 0.2 10*3/uL (ref 0.0–0.7)
Eosinophils Relative: 2 % (ref 0–5)
HCT: 37.6 % (ref 36.0–46.0)
Hemoglobin: 11.8 g/dL — ABNORMAL LOW (ref 12.0–15.0)
Lymphocytes Relative: 19 % (ref 12–46)
Lymphs Abs: 1.6 10*3/uL (ref 0.7–4.0)
MCH: 27.7 pg (ref 26.0–34.0)
MCHC: 31.4 g/dL (ref 30.0–36.0)
MCV: 88.3 fL (ref 78.0–100.0)
Monocytes Absolute: 0.6 10*3/uL (ref 0.1–1.0)
Monocytes Relative: 7 % (ref 3–12)
Neutro Abs: 6.1 10*3/uL (ref 1.7–7.7)
Neutrophils Relative %: 72 % (ref 43–77)
Platelets: 264 10*3/uL (ref 150–400)
RBC: 4.26 MIL/uL (ref 3.87–5.11)
RDW: 14.5 % (ref 11.5–15.5)
WBC: 8.5 10*3/uL (ref 4.0–10.5)

## 2013-06-02 LAB — BASIC METABOLIC PANEL
BUN: 13 mg/dL (ref 6–23)
CO2: 30 mEq/L (ref 19–32)
Calcium: 9.1 mg/dL (ref 8.4–10.5)
Chloride: 103 mEq/L (ref 96–112)
Creatinine, Ser: 0.86 mg/dL (ref 0.50–1.10)
GFR calc Af Amer: 89 mL/min — ABNORMAL LOW (ref >90)
GFR calc non Af Amer: 77 mL/min — ABNORMAL LOW (ref >90)
Glucose, Bld: 106 mg/dL — ABNORMAL HIGH (ref 70–99)
Potassium: 3.5 mEq/L (ref 3.5–5.1)
Sodium: 138 mEq/L (ref 135–145)

## 2013-06-02 MED ORDER — HYDROCODONE-ACETAMINOPHEN 5-325 MG PO TABS
1.0000 | ORAL_TABLET | Freq: Once | ORAL | Status: AC
  Administered 2013-06-02: 1 via ORAL
  Filled 2013-06-02: qty 1

## 2013-06-02 NOTE — ED Provider Notes (Signed)
History     CSN: [id number]  Arrival date & time 06/02/13  1407   First MD Initiated Contact with Patient 06/02/13 1601      Chief Complaint  Patient presents with  . Leg Pain    (Consider location/radiation/quality/duration/timing/severity/associated sxs/prior treatment) HPI Comments: Pt comes in with cc of leg pain. Pt has a stasis ulcer to the right leg, chronic, being managed as an outpatient. Unfortunately, she has had some family emergencies over the past few days, and has not been able to take good care of the wound, and noted little increase drainage, clear, thin liquid, and swelling - so she comes to the ED. There is no n/v/f/c. No increase in wound site. No bleeding. No increase in pain. Pt has a PCP appt tomorrow.  Patient is a 52 y.o. female presenting with leg pain. The history is provided by the patient.  Leg Pain Associated symptoms: no neck pain     Past Medical History  Diagnosis Date  . Hypertension   . Depression   . Asthma   . Ulcer     left posterior calf  . Headache(784.0)   . Hyperlipidemia   . Lung nodules     Past Surgical History  Procedure Laterality Date  . No past surgeries      Family History  Problem Relation Age of Onset  . Heart failure Mother   . Heart disease Father     History  Substance Use Topics  . Smoking status: Never Smoker   . Smokeless tobacco: Never Used  . Alcohol Use: No    OB History   Grav Para Term Preterm Abortions TAB SAB Ect Mult Living                  Review of Systems  Constitutional: Positive for activity change.  HENT: Negative for neck pain.   Respiratory: Negative for shortness of breath.   Cardiovascular: Negative for chest pain.  Gastrointestinal: Negative for nausea, vomiting and abdominal pain.  Genitourinary: Negative for dysuria.  Skin: Positive for wound.  Neurological: Negative for headaches.    Allergies  Review of patient's allergies indicates no known allergies.  Home  Medications   Current Outpatient Rx  Name  Route  Sig  Dispense  Refill  . albuterol (PROVENTIL HFA;VENTOLIN HFA) 108 (90 BASE) MCG/ACT inhaler   Inhalation   Inhale 2 puffs into the lungs every 6 (six) hours as needed for shortness of breath.         Marland Kitchen amLODipine (NORVASC) 5 MG tablet   Oral   Take 5 mg by mouth daily.         . furosemide (LASIX) 20 MG tablet   Oral   Take 20 mg by mouth 2 (two) times daily.         Marland Kitchen ibuprofen (ADVIL,MOTRIN) 200 MG tablet   Oral   Take 800 mg by mouth 3 (three) times daily. For pain         . losartan (COZAAR) 100 MG tablet   Oral   Take 100 mg by mouth daily.         . silver sulfADIAZINE (SILVADENE) 1 % cream   Topical   Apply topically 2 (two) times daily.   400 g   0   . traMADol (ULTRAM) 50 MG tablet   Oral   Take 1 tablet (50 mg total) by mouth every 6 (six) hours as needed for pain.   15 tablet   0   .  Wound Dressings (MAXORB EXTRA AG+) PADS   Apply externally   Apply topically every other day.         Marland Kitchen EXPIRED: albuterol (PROVENTIL HFA;VENTOLIN HFA) 108 (90 BASE) MCG/ACT inhaler   Inhalation   Inhale 1-2 puffs into the lungs every 6 (six) hours as needed for wheezing.   1 Inhaler   0     BP 167/94  Pulse 98  Temp(Src) 98.7 F (37.1 C) (Oral)  Resp 18  SpO2 97%  LMP 11/21/2011  Physical Exam  Nursing note and vitals reviewed. Constitutional: She is oriented to person, place, and time. She appears well-developed and well-nourished.  HENT:  Head: Normocephalic and atraumatic.  Eyes: EOM are normal. Pupils are equal, round, and reactive to light.  Neck: Neck supple.  Cardiovascular: Normal rate, regular rhythm and normal heart sounds.   No murmur heard. Pulmonary/Chest: Effort normal. No respiratory distress.  Abdominal: Soft. She exhibits no distension. There is no tenderness. There is no rebound and no guarding.  Musculoskeletal:  Right tibia - there is about 18 cm x 12 cm area of ulcer, with  yellowish, what appear to be granulation tissue, covering it. There is no surrounding erythema, callor, dolor, no crepitus, purulent drainage. Dopplerable pulses. Left lower extremity - posterior wound, no ulcers  Neurological: She is alert and oriented to person, place, and time.  Skin: Skin is warm and dry.    ED Course  Procedures (including critical care time)  Labs Reviewed  CBC WITH DIFFERENTIAL - Abnormal; Notable for the following:    Hemoglobin 11.8 (*)    All other components within normal limits  BASIC METABOLIC PANEL - Abnormal; Notable for the following:    Glucose, Bld 106 (*)    GFR calc non Af Amer 77 (*)    GFR calc Af Amer 89 (*)    All other components within normal limits   No results found.   No diagnosis found.    MDM  Pt comes in with cc of wound evaluation. No signs of infection on the exam. Has poor circulation. No underlying DM, immunocompromised status. She has appt tomorrow with pcp.  I am not adding Xray, as not concerned for free air/nec fascitis. Requested patient to have pcp give good counseling on wound care, and may be wound care follow up. No antibiotics from this visit, again - appears to be a chronic healing ulcer at this time.  Varney Biles, MD 06/02/13 1700

## 2013-06-02 NOTE — ED Notes (Signed)
Pt sts chronic ulcers to bil legs and sts thinks right one is infected with drainage and foul odor

## 2013-07-02 ENCOUNTER — Encounter (HOSPITAL_BASED_OUTPATIENT_CLINIC_OR_DEPARTMENT_OTHER): Payer: Medicaid Other | Attending: Internal Medicine

## 2013-07-02 DIAGNOSIS — E785 Hyperlipidemia, unspecified: Secondary | ICD-10-CM | POA: Insufficient documentation

## 2013-07-02 DIAGNOSIS — L97809 Non-pressure chronic ulcer of other part of unspecified lower leg with unspecified severity: Secondary | ICD-10-CM | POA: Insufficient documentation

## 2013-07-02 DIAGNOSIS — I10 Essential (primary) hypertension: Secondary | ICD-10-CM | POA: Insufficient documentation

## 2013-07-02 DIAGNOSIS — I8390 Asymptomatic varicose veins of unspecified lower extremity: Secondary | ICD-10-CM | POA: Insufficient documentation

## 2013-07-02 DIAGNOSIS — I890 Lymphedema, not elsewhere classified: Secondary | ICD-10-CM | POA: Insufficient documentation

## 2013-07-02 DIAGNOSIS — Z79899 Other long term (current) drug therapy: Secondary | ICD-10-CM | POA: Insufficient documentation

## 2013-07-02 DIAGNOSIS — I872 Venous insufficiency (chronic) (peripheral): Secondary | ICD-10-CM | POA: Insufficient documentation

## 2013-07-03 NOTE — Progress Notes (Signed)
[facility]  NAME:  Kari [name], Kari [name]             ACCOUNT NO.:  [id number]  MEDICAL RECORD NO.:  [id number]      DATE OF BIRTH:  1961/08/04  PHYSICIAN:  Ricard Dillon, M.D. VISIT DATE:  07/02/2013                                  OFFICE VISIT   CHIEF COMPLAINT:  Here for review of wounds on her bilateral lower extremities.  HISTORY OF PRESENT ILLNESS:  Kari [name] is a pleasant 52 year old woman.  She tells me that she has had 2 small wounds on her left lateral leg that began roughly a year ago.  More recently, over the last 3 months she has developed another wound on her right leg.  This started as a small area which she scratched due to itchiness.  This has progressed.  She is using topical antibiotics on this.  She was recently seen by Dr. Criss Rosales, noted to have bilateral ulcerations, and she has been kindly referred here for our review.  PAST MEDICAL HISTORY: 1. Hypertension. 2. Depression. 3. Asthma. 4. Venous stasis ulceration. 5. Headaches. 6. Hyperlipidemia. 7. History of lung nodules.  She had lower extremity venous duplex Dopplers in November 2013.  She had no evidence of a DVT or superficial venous thrombophlebitis.  She had significant superficial venous reflux in the right great saphenous vein, very tortuous in the superficial, and tortuous in the mid thigh. There is evidence of deep reflux in the right lower extremity and the left common femoral vein.  CURRENT MEDICATION LIST:  Reviewed.  She is on: 1. Amlodipine 5 mg a day. 2. Ambien 10 at bedtime. 3. Furosemide 20 mg daily. 4. Ibuprofen 800 mg t.i.d. p.r.n. 5. Lisinopril 20 daily. 6. Losartan 100 daily.  PHYSICAL EXAMINATION:  VITAL SIGNS:  Temperature is 98.5, pulse 80, respirations 18, blood pressure 162/72, weight 309 pounds. RESPIRATORY:  Clear air entry bilaterally. CARDIAC:  Heart sounds are normal.  There are no murmurs, gallops, or elevation in her jugular venous  pressure.  No evidence of heart failure at the bedside. EXTREMITIES:  She has lower extremity venous stasis and lower extremity edema right greater than left.  This is probably a combination of venous stasis and secondary lymphedema.  WOUND EXAM:  There are 2 extensive areas of ulceration.  Both in reasonably similar condition, first on the right anterior lower leg measuring 11 x 10 x 0.1.  This is covered with an extensive degree of tight surface slough.  There was an odor here but no evidence of cellulitis.  The second area on the left lateral lower extremity roughly in the mid leg aspect measuring 9.4 x 6.5 x 0.2.  Both of these areas were anesthetized with topical lidocaine 4%.  Using a curette, I did as much of a nonselective debridement as she could tolerate.  I did not generate any cultures as they does not seem to be a clinical evidence of cellulitis.  Peripheral vascular evaluation revealed palpable dorsalis pedis pulses bilaterally, however, we could not calculate her ankle-brachial index.  IMPRESSION:  Extensive bilateral venous stasis ulcerations in the setting of chronic varicosities and secondary lymphedema.  Both of these wounds underwent a debridement, which the patient tolerates marginally. Unfortunately, further mechanical debridements are likely to be necessary.  We dressed these wounds with Santyl, Hydrogel, silver  alginate, Vaseline gauze under an Haematologist with Kerlix and Coban.  We will need to order arterial studies on this patient.  She may need to have her Lasix increased to see if we can get better edema control. Unfortunately, further debridements are going to be necessary on these areas as they are really not close to any form of healing state.  She will be reviewed in 1 week's time.          ______________________________ Ricard Dillon, M.D.     MGR/MEDQ  D:  07/02/2013  T:  07/03/2013  Job:  [id number]

## 2013-07-24 HISTORY — PX: OTHER SURGICAL HISTORY: SHX169

## 2013-07-30 ENCOUNTER — Encounter (HOSPITAL_BASED_OUTPATIENT_CLINIC_OR_DEPARTMENT_OTHER): Payer: Medicaid Other | Attending: Internal Medicine

## 2013-07-30 DIAGNOSIS — L97809 Non-pressure chronic ulcer of other part of unspecified lower leg with unspecified severity: Secondary | ICD-10-CM | POA: Insufficient documentation

## 2013-07-30 DIAGNOSIS — I872 Venous insufficiency (chronic) (peripheral): Secondary | ICD-10-CM | POA: Insufficient documentation

## 2013-10-08 ENCOUNTER — Inpatient Hospital Stay (HOSPITAL_COMMUNITY)
Admission: EM | Admit: 2013-10-08 | Discharge: 2013-10-14 | DRG: 872 | Disposition: A | Discharge status: Home Health | Payer: Medicaid Other | Attending: Internal Medicine | Admitting: Internal Medicine

## 2013-10-08 ENCOUNTER — Inpatient Hospital Stay (HOSPITAL_COMMUNITY): Payer: Medicaid Other

## 2013-10-08 ENCOUNTER — Encounter (HOSPITAL_COMMUNITY): Payer: Self-pay | Admitting: Emergency Medicine

## 2013-10-08 DIAGNOSIS — G4733 Obstructive sleep apnea (adult) (pediatric): Secondary | ICD-10-CM | POA: Diagnosis present

## 2013-10-08 DIAGNOSIS — F329 Major depressive disorder, single episode, unspecified: Secondary | ICD-10-CM | POA: Diagnosis present

## 2013-10-08 DIAGNOSIS — Z91199 Patient's noncompliance with other medical treatment and regimen due to unspecified reason: Secondary | ICD-10-CM | POA: Diagnosis present

## 2013-10-08 DIAGNOSIS — I83009 Varicose veins of unspecified lower extremity with ulcer of unspecified site: Secondary | ICD-10-CM

## 2013-10-08 DIAGNOSIS — I872 Venous insufficiency (chronic) (peripheral): Secondary | ICD-10-CM | POA: Diagnosis present

## 2013-10-08 DIAGNOSIS — L03119 Cellulitis of unspecified part of limb: Secondary | ICD-10-CM | POA: Diagnosis present

## 2013-10-08 DIAGNOSIS — E869 Volume depletion, unspecified: Secondary | ICD-10-CM | POA: Diagnosis present

## 2013-10-08 DIAGNOSIS — E875 Hyperkalemia: Secondary | ICD-10-CM | POA: Diagnosis present

## 2013-10-08 DIAGNOSIS — F3289 Other specified depressive episodes: Secondary | ICD-10-CM | POA: Diagnosis present

## 2013-10-08 DIAGNOSIS — A419 Sepsis, unspecified organism: Principal | ICD-10-CM | POA: Diagnosis present

## 2013-10-08 DIAGNOSIS — N179 Acute kidney failure, unspecified: Secondary | ICD-10-CM | POA: Diagnosis present

## 2013-10-08 DIAGNOSIS — L97809 Non-pressure chronic ulcer of other part of unspecified lower leg with unspecified severity: Secondary | ICD-10-CM | POA: Diagnosis present

## 2013-10-08 DIAGNOSIS — Z6841 Body Mass Index (BMI) 40.0 and over, adult: Secondary | ICD-10-CM

## 2013-10-08 DIAGNOSIS — L02419 Cutaneous abscess of limb, unspecified: Secondary | ICD-10-CM | POA: Diagnosis present

## 2013-10-08 DIAGNOSIS — E871 Hypo-osmolality and hyponatremia: Secondary | ICD-10-CM | POA: Diagnosis present

## 2013-10-08 DIAGNOSIS — I10 Essential (primary) hypertension: Secondary | ICD-10-CM | POA: Diagnosis present

## 2013-10-08 DIAGNOSIS — D649 Anemia, unspecified: Secondary | ICD-10-CM | POA: Diagnosis present

## 2013-10-08 DIAGNOSIS — R918 Other nonspecific abnormal finding of lung field: Secondary | ICD-10-CM | POA: Diagnosis present

## 2013-10-08 DIAGNOSIS — Z9119 Patient's noncompliance with other medical treatment and regimen: Secondary | ICD-10-CM | POA: Diagnosis present

## 2013-10-08 DIAGNOSIS — R5381 Other malaise: Secondary | ICD-10-CM | POA: Diagnosis present

## 2013-10-08 DIAGNOSIS — L0291 Cutaneous abscess, unspecified: Secondary | ICD-10-CM

## 2013-10-08 DIAGNOSIS — R112 Nausea with vomiting, unspecified: Secondary | ICD-10-CM | POA: Diagnosis present

## 2013-10-08 DIAGNOSIS — D869 Sarcoidosis, unspecified: Secondary | ICD-10-CM | POA: Diagnosis present

## 2013-10-08 DIAGNOSIS — N19 Unspecified kidney failure: Secondary | ICD-10-CM

## 2013-10-08 LAB — URINALYSIS, ROUTINE W REFLEX MICROSCOPIC
Glucose, UA: NEGATIVE mg/dL
Ketones, ur: NEGATIVE mg/dL
Nitrite: NEGATIVE
Protein, ur: 30 mg/dL — AB
Specific Gravity, Urine: 1.032 — ABNORMAL HIGH (ref 1.005–1.030)
Urobilinogen, UA: 1 mg/dL (ref 0.0–1.0)
pH: 5 (ref 5.0–8.0)

## 2013-10-08 LAB — CBC WITH DIFFERENTIAL/PLATELET
Basophils Absolute: 0 10*3/uL (ref 0.0–0.1)
Basophils Relative: 0 % (ref 0–1)
Eosinophils Absolute: 0 10*3/uL (ref 0.0–0.7)
Eosinophils Relative: 0 % (ref 0–5)
HCT: 30.3 % — ABNORMAL LOW (ref 36.0–46.0)
Hemoglobin: 10.5 g/dL — ABNORMAL LOW (ref 12.0–15.0)
Lymphocytes Relative: 4 % — ABNORMAL LOW (ref 12–46)
Lymphs Abs: 1 10*3/uL (ref 0.7–4.0)
MCH: 29 pg (ref 26.0–34.0)
MCHC: 34.7 g/dL (ref 30.0–36.0)
MCV: 83.7 fL (ref 78.0–100.0)
Monocytes Absolute: 1 10*3/uL (ref 0.1–1.0)
Monocytes Relative: 4 % (ref 3–12)
Neutro Abs: 23.8 10*3/uL — ABNORMAL HIGH (ref 1.7–7.7)
Neutrophils Relative %: 92 % — ABNORMAL HIGH (ref 43–77)
Platelets: 452 10*3/uL — ABNORMAL HIGH (ref 150–400)
RBC: 3.62 MIL/uL — ABNORMAL LOW (ref 3.87–5.11)
RDW: 16.9 % — ABNORMAL HIGH (ref 11.5–15.5)
WBC: 25.8 10*3/uL — ABNORMAL HIGH (ref 4.0–10.5)

## 2013-10-08 LAB — COMPREHENSIVE METABOLIC PANEL
ALT: 12 U/L (ref 0–35)
AST: 9 U/L (ref 0–37)
Albumin: 2.2 g/dL — ABNORMAL LOW (ref 3.5–5.2)
Alkaline Phosphatase: 115 U/L (ref 39–117)
BUN: 118 mg/dL — ABNORMAL HIGH (ref 6–23)
CO2: 15 mEq/L — ABNORMAL LOW (ref 19–32)
Calcium: 9.5 mg/dL (ref 8.4–10.5)
Chloride: 93 mEq/L — ABNORMAL LOW (ref 96–112)
Creatinine, Ser: 6.24 mg/dL — ABNORMAL HIGH (ref 0.50–1.10)
GFR calc Af Amer: 8 mL/min — ABNORMAL LOW (ref >90)
GFR calc non Af Amer: 7 mL/min — ABNORMAL LOW (ref >90)
Glucose, Bld: 125 mg/dL — ABNORMAL HIGH (ref 70–99)
Potassium: 5.3 mEq/L — ABNORMAL HIGH (ref 3.5–5.1)
Sodium: 127 mEq/L — ABNORMAL LOW (ref 135–145)
Total Bilirubin: 0.8 mg/dL (ref 0.3–1.2)
Total Protein: 9.5 g/dL — ABNORMAL HIGH (ref 6.0–8.3)

## 2013-10-08 LAB — URINE MICROSCOPIC-ADD ON

## 2013-10-08 MED ORDER — STERILE WATER FOR INJECTION IV SOLN
INTRAVENOUS | Status: DC
  Administered 2013-10-08 – 2013-10-10 (×6): via INTRAVENOUS
  Filled 2013-10-08 (×15): qty 9.7

## 2013-10-08 MED ORDER — ALBUTEROL SULFATE HFA 108 (90 BASE) MCG/ACT IN AERS
2.0000 | INHALATION_SPRAY | Freq: Four times a day (QID) | RESPIRATORY_TRACT | Status: DC | PRN
  Filled 2013-10-08: qty 6.7

## 2013-10-08 MED ORDER — AMLODIPINE BESYLATE 5 MG PO TABS
5.0000 mg | ORAL_TABLET | Freq: Every day | ORAL | Status: DC
  Administered 2013-10-08: 5 mg via ORAL
  Filled 2013-10-08: qty 1

## 2013-10-08 MED ORDER — SODIUM CHLORIDE 0.9 % IV SOLN
INTRAVENOUS | Status: DC
  Administered 2013-10-08: 05:00:00 via INTRAVENOUS

## 2013-10-08 MED ORDER — VANCOMYCIN HCL 10 G IV SOLR
2000.0000 mg | INTRAVENOUS | Status: DC
  Administered 2013-10-10: 2000 mg via INTRAVENOUS
  Filled 2013-10-08: qty 2000

## 2013-10-08 MED ORDER — ONDANSETRON HCL 4 MG PO TABS: 4.0000 mg | ORAL_TABLET | Freq: Four times a day (QID) | ORAL | Status: DC | PRN

## 2013-10-08 MED ORDER — HYDROMORPHONE HCL PF 1 MG/ML IJ SOLN: 1.0000 mg | INTRAMUSCULAR | Status: DC | PRN

## 2013-10-08 MED ORDER — HYDROMORPHONE HCL PF 1 MG/ML IJ SOLN
1.0000 mg | INTRAMUSCULAR | Status: DC | PRN
  Administered 2013-10-08 (×3): 1 mg via INTRAVENOUS
  Filled 2013-10-08 (×3): qty 1

## 2013-10-08 MED ORDER — PIPERACILLIN-TAZOBACTAM 3.375 G IVPB
3.3750 g | Freq: Once | INTRAVENOUS | Status: AC
  Administered 2013-10-08: 3.375 g via INTRAVENOUS
  Filled 2013-10-08 (×2): qty 50

## 2013-10-08 MED ORDER — HYDROMORPHONE HCL PF 1 MG/ML IJ SOLN
1.0000 mg | INTRAMUSCULAR | Status: DC | PRN
  Administered 2013-10-08 (×2): 1 mg via INTRAVENOUS
  Filled 2013-10-08 (×2): qty 1

## 2013-10-08 MED ORDER — HYDROCODONE-ACETAMINOPHEN 5-325 MG PO TABS
1.0000 | ORAL_TABLET | Freq: Four times a day (QID) | ORAL | Status: DC | PRN
  Administered 2013-10-09 – 2013-10-11 (×5): 1 via ORAL
  Filled 2013-10-08 (×5): qty 1

## 2013-10-08 MED ORDER — PIPERACILLIN-TAZOBACTAM IN DEX 2-0.25 GM/50ML IV SOLN
2.2500 g | Freq: Four times a day (QID) | INTRAVENOUS | Status: DC
  Administered 2013-10-08 – 2013-10-11 (×11): 2.25 g via INTRAVENOUS
  Filled 2013-10-08 (×13): qty 50

## 2013-10-08 MED ORDER — VANCOMYCIN HCL IN DEXTROSE 1-5 GM/200ML-% IV SOLN
1000.0000 mg | Freq: Once | INTRAVENOUS | Status: AC
  Administered 2013-10-08: 1000 mg via INTRAVENOUS
  Filled 2013-10-08: qty 200

## 2013-10-08 MED ORDER — SODIUM CHLORIDE 0.9 % IV SOLN
INTRAVENOUS | Status: DC
  Administered 2013-10-08: 01:00:00 via INTRAVENOUS

## 2013-10-08 MED ORDER — VANCOMYCIN HCL 10 G IV SOLR: 1500.0000 mg | INTRAVENOUS | Status: DC

## 2013-10-08 MED ORDER — DOCUSATE SODIUM 100 MG PO CAPS
100.0000 mg | ORAL_CAPSULE | Freq: Two times a day (BID) | ORAL | Status: DC
  Administered 2013-10-08 – 2013-10-14 (×13): 100 mg via ORAL
  Filled 2013-10-08 (×14): qty 1

## 2013-10-08 MED ORDER — HEPARIN SODIUM (PORCINE) 5000 UNIT/ML IJ SOLN
5000.0000 [IU] | Freq: Three times a day (TID) | INTRAMUSCULAR | Status: DC
  Administered 2013-10-08 – 2013-10-14 (×18): 5000 [IU] via SUBCUTANEOUS
  Filled 2013-10-08 (×23): qty 1

## 2013-10-08 MED ORDER — ONDANSETRON HCL 4 MG/2ML IJ SOLN
4.0000 mg | Freq: Four times a day (QID) | INTRAMUSCULAR | Status: DC | PRN
  Administered 2013-10-08 – 2013-10-12 (×4): 4 mg via INTRAVENOUS
  Filled 2013-10-08 (×4): qty 2

## 2013-10-08 MED ORDER — ADULT MULTIVITAMIN W/MINERALS CH
1.0000 | ORAL_TABLET | Freq: Every day | ORAL | Status: DC
  Administered 2013-10-08 – 2013-10-14 (×7): 1 via ORAL
  Filled 2013-10-08 (×7): qty 1

## 2013-10-08 MED ORDER — HYDROMORPHONE HCL PF 1 MG/ML IJ SOLN
0.5000 mg | INTRAMUSCULAR | Status: DC | PRN
  Administered 2013-10-09 – 2013-10-10 (×7): 0.5 mg via INTRAVENOUS
  Filled 2013-10-08 (×7): qty 1

## 2013-10-08 NOTE — Progress Notes (Signed)
Wounds cleansed bilateral lower legs, xeroform applied to ulcers bilaterally covered with ABD pads and wrapped with kerlix.  Patient tolerated this process well.  Heels elevated off bed with pillows.  Perineal care given, lower abdominal fold and groin folds are moist, skin red.  Applied Inter-dry to this area.

## 2013-10-08 NOTE — Progress Notes (Signed)
Pt aaox3.  Pt family at bedside.  Pt nadn.  Pt assessment has no change from previous RN

## 2013-10-08 NOTE — ED Notes (Signed)
Bed: WA07 Expected date: 10/07/13 Expected time: 11:58 PM Means of arrival: Ambulance Comments: Bed 7, EMS, 15 F, N/V

## 2013-10-08 NOTE — ED Notes (Signed)
Per EMS: Pt c/o fatigue, nausea, and lack of appetite x 1 wk. Pt had several bouts of emesis today. Pt has decubitus ulcer, green and yellow discharge, weeping and thick over both calves. Pt also has large blister to L heel, R foot has skin sloughing. Foul odor. Family states pt has had ulcers for years but significantly worse over past month.

## 2013-10-08 NOTE — ED Provider Notes (Addendum)
CSN: [id number]     Arrival date & time 10/08/13  0021 History   First MD Initiated Contact with Patient 10/08/13 0046     Chief Complaint  Patient presents with  . Nausea  . Fatigue  . Wound Infection   (Consider location/radiation/quality/duration/timing/severity/associated sxs/prior Treatment) The history is provided by the patient.   Patient here complaining of fatigue and nausea and bilateral lower extremity wounds with drainage. History of similar symptoms associated with infectious these areas. He is to be seen by wound care specialists but none now. Was draining yellow material and are located on her bilateral lower anterior tibia areas with some circumferential involvement. Denies any vomiting. No abdominal or chest pain. No treatment used for this prior to arrival. Pain is gone worse the point now where she cannot walk. Past Medical History  Diagnosis Date  . Hypertension   . Depression   . Asthma   . Ulcer     left posterior calf  . Headache(784.0)   . Hyperlipidemia   . Lung nodules    Past Surgical History  Procedure Laterality Date  . No past surgeries     Family History  Problem Relation Age of Onset  . Heart failure Mother   . Heart disease Father    History  Substance Use Topics  . Smoking status: Never Smoker   . Smokeless tobacco: Never Used  . Alcohol Use: No   OB History   Grav Para Term Preterm Abortions TAB SAB Ect Mult Living                 Review of Systems  All other systems reviewed and are negative.    Allergies  Review of patient's allergies indicates no known allergies.  Home Medications   Current Outpatient Rx  Name  Route  Sig  Dispense  Refill  . amLODipine (NORVASC) 5 MG tablet   Oral   Take 5 mg by mouth daily.         Marland Kitchen losartan (COZAAR) 100 MG tablet   Oral   Take 100 mg by mouth daily.         Marland Kitchen albuterol (PROVENTIL HFA;VENTOLIN HFA) 108 (90 BASE) MCG/ACT inhaler   Inhalation   Inhale 2 puffs into the lungs  every 6 (six) hours as needed for shortness of breath.          BP 119/54  Pulse 92  Temp(Src) 97.9 F (36.6 C) (Oral)  Resp 20  Ht 5\' 7"  (1.702 m)  Wt 318 lb (144.244 kg)  BMI 49.79 kg/m2  SpO2 98%  LMP 11/21/2011 Physical Exam  Nursing note and vitals reviewed. Constitutional: She is oriented to person, place, and time. She appears well-developed and well-nourished.  Non-toxic appearance. No distress.  HENT:  Head: Normocephalic and atraumatic.  Eyes: Conjunctivae, EOM and lids are normal. Pupils are equal, round, and reactive to light.  Neck: Normal range of motion. Neck supple. No tracheal deviation present. No mass present.  Cardiovascular: Normal rate, regular rhythm and normal heart sounds.  Exam reveals no gallop.   No murmur heard. Pulmonary/Chest: Effort normal and breath sounds normal. No stridor. No respiratory distress. She has no decreased breath sounds. She has no wheezes. She has no rhonchi. She has no rales.  Abdominal: Soft. Normal appearance and bowel sounds are normal. She exhibits no distension. There is no tenderness. There is no rebound and no CVA tenderness.  Musculoskeletal: Normal range of motion. She exhibits no edema and no tenderness.  Bilateral large ulcers noted to lower extremities with purulent drainage with a sweet like odor  Neurological: She is alert and oriented to person, place, and time. She has normal strength. No cranial nerve deficit or sensory deficit. GCS eye subscore is 4. GCS verbal subscore is 5. GCS motor subscore is 6.  Skin: Skin is warm and dry. No abrasion and no rash noted.  Psychiatric: She has a normal mood and affect. Her speech is normal and behavior is normal.    ED Course  Procedures (including critical care time) Labs Review Labs Reviewed  CBC WITH DIFFERENTIAL - Abnormal; Notable for the following:    WBC 25.8 (*)    RBC 3.62 (*)    Hemoglobin 10.5 (*)    HCT 30.3 (*)    RDW 16.9 (*)    Platelets 452 (*)     Neutrophils Relative % 92 (*)    Lymphocytes Relative 4 (*)    Neutro Abs 23.8 (*)    All other components within normal limits  COMPREHENSIVE METABOLIC PANEL - Abnormal; Notable for the following:    Sodium 127 (*)    Potassium 5.3 (*)    Chloride 93 (*)    CO2 15 (*)    Glucose, Bld 125 (*)    BUN 118 (*)    Creatinine, Ser 6.24 (*)    Total Protein 9.5 (*)    Albumin 2.2 (*)    GFR calc non Af Amer 7 (*)    GFR calc Af Amer 8 (*)    All other components within normal limits   Imaging Review No results found.  EKG Interpretation   None       MDM  No diagnosis found.  Patient started on antibiotics for her wound infection and will be admitted by the hospitalist service Patient's renal failure noted as well and she does have mild hyperkalemia.  Leota Jacobsen, MD 10/08/13 [id number]  Leota Jacobsen, MD 10/08/13 [id number]

## 2013-10-08 NOTE — H&P (Signed)
[facility] History and Physical  [name] [id number] DOB: 05/01/1961    PCP:   Maggie Font, MD   Chief Complaint: feeling fatigue, and having increased drainage of her chronic venous ulcers.  HPI: Kari [name] is an 52 y.o. 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  She has not been taking well orally.  Evaluation in the ER showed Cr to be 6.24 with BUN 115, WBC of 25K, K of 5.3, and Na of 127.  Her legs showed foul smelling purulent discharge, with blistering of the skin, edematous, and deep circumferential ulcers.  She has no fever, and has no shortness of breath.  Hospitalist was asked to admit her for acute renal failure, bilateral cellulitis, and worsening chronic venous ulcers.    Rewiew of Systems:  Constitutional: No significant weight loss or weight gain Eyes: Negative for eye pain, redness and discharge, diplopia, visual changes, or flashes of light. ENMT: Negative for ear pain, hoarseness, nasal congestion, sinus pressure and sore throat. No headaches; tinnitus, drooling, or problem swallowing. Cardiovascular: Negative for chest pain, palpitations, diaphoresis, dyspnea No orthopnea, PND Respiratory: Negative for cough, hemoptysis, wheezing and stridor. No pleuritic chestpain. Gastrointestinal: Negative for nausea, vomiting, diarrhea, constipation, abdominal pain, melena, blood in stool, hematemesis, jaundice and rectal bleeding.    Genitourinary: Negative for frequency, dysuria, incontinence,flank pain and hematuria; Musculoskeletal: Negative for back pain and neck pain. Negative for swelling and trauma.;  Skin: . Tender lower extremities with increase swelling. Neuro: Negative for headache, lightheadedness and neck stiffness. Negative for  weakness, altered level of consciousness , altered mental status, extremity weakness, burning feet, involuntary movement, seizure and syncope.  Psych: negative for anxiety, depression, insomnia, tearfulness, panic attacks, hallucinations, paranoia, suicidal or homicidal ideation.   Past Medical History  Diagnosis Date  . Hypertension   . Depression   . Asthma   . Ulcer     left posterior calf  . Headache(784.0)   . Hyperlipidemia   . Lung nodules     Past Surgical History  Procedure Laterality Date  . No past surgeries      Medications:  HOME MEDS: Prior to Admission medications   Medication Sig Start Date End Date Taking? Authorizing Provider  amLODipine (NORVASC) 5 MG tablet Take 5 mg by mouth daily.   Yes Historical Provider, MD  losartan (COZAAR) 100 MG tablet Take 100 mg by mouth daily.   Yes Historical Provider, MD  albuterol (PROVENTIL HFA;VENTOLIN HFA) 108 (90 BASE) MCG/ACT inhaler Inhale 2 puffs into the lungs every 6 (six) hours as needed for shortness of breath.    Historical Provider, MD     Allergies:  No Known Allergies  Social History:   reports that she has never smoked. She has never used smokeless tobacco. She reports that she does not drink alcohol or use illicit drugs.  Family History: Family History  Problem Relation Age of Onset  . Heart failure Mother   . Heart disease Father      Physical Exam: Filed Vitals:   10/08/13 0023 10/08/13 0033  BP: 119/54   Pulse: 92   Temp:  97.9 F (36.6 C)  TempSrc:  Oral  Resp: 20   Height:  5\' 7"  (1.702 m)  Weight:  144.244 kg (318 lb)  SpO2: 98%  Blood pressure 119/54, pulse 92, temperature 97.9 F (36.6 C), temperature source Oral, resp. rate 20, height 5\' 7"  (1.702 m), weight 144.244 kg (318 lb), last menstrual period 11/21/2011, SpO2 98.00%.  GEN:  Pleasant  patient lying in the stretcher in no acute distress; cooperative with exam. PSYCH:  alert and oriented x4; does not appear anxious   affect is appropriate. HEENT: Mucous membranes pink and anicteric; PERRLA; EOM intact; no cervical lymphadenopathy nor thyromegaly or carotid bruit; no JVD; There were no stridor. Neck is very supple. Breasts:: Not examined CHEST WALL: No tenderness CHEST: Normal respiration, clear to auscultation bilaterally.  HEART: Regular rate and rhythm.  There are no murmur, rub, or gallops.   BACK: No kyphosis or scoliosis; no CVA tenderness ABDOMEN: soft and non-tender; no masses, no organomegaly, normal abdominal bowel sounds; no pannus; no intertriginous candida. There is no rebound and no distention.  She is quite obese. Rectal Exam: Not done EXTREMITIES: No bone or joint deformity; age-appropriate arthropathy of the hands and knees; Severe edema, with bilateral deep ulcers, and purulent foul smelling discharge, with tenderness of touch. Genitalia: not examined PULSES: decreased bilaterally.  Feet are cool, but no cold. SKIN: Normal hydration no rash or ulceration CNS: Cranial nerves 2-12 grossly intact no focal lateralizing neurologic deficit.  Speech is fluent; uvula elevated with phonation, facial symmetry and tongue midline. DTR are normal bilaterally, cerebella exam is intact, barbinski is negative and strengths are equaled bilaterally.  No sensory loss.   Labs on Admission:  Basic Metabolic Panel:  Recent Labs Lab 10/08/13 0119  NA 127*  K 5.3*  CL 93*  CO2 15*  GLUCOSE 125*  BUN 118*  CREATININE 6.24*  CALCIUM 9.5   Liver Function Tests:  Recent Labs Lab 10/08/13 0119  AST 9  ALT 12  ALKPHOS 115  BILITOT 0.8  PROT 9.5*  ALBUMIN 2.2*   No results found for this basename: LIPASE, AMYLASE,  in the last 168 hours No results found for this basename: AMMONIA,  in the last 168 hours CBC:  Recent Labs Lab 10/08/13 0119  WBC 25.8*  NEUTROABS 23.8*  HGB 10.5*  HCT 30.3*  MCV 83.7  PLT 452*   Cardiac Enzymes: No results found for this basename: CKTOTAL, CKMB, CKMBINDEX,  TROPONINI,  in the last 168 hours  CBG: No results found for this basename: GLUCAP,  in the last 168 hours   Radiological Exams on Admission: No results found.  Assessment/Plan Present on Admission:  . ARF (acute renal failure) . Cellulitis and abscess . Venous stasis ulcers . Pulmonary nodules . Hypertension . Obesity . Volume depletion . Depression  PLAN:  She will be admitted for acute renal failure.  I suspect it is volume depletion, worsened by Losartan, and infection.  Will give her IVF, obtain UA, and obtain renal US.  Follow Cr carefully, and if not improving, she will need renal consult and transfer to [facility].  I am hopeful that it will respond to IVF and discontinuation of Losartan.  For her severe venous stasis ulcer and infection, blood cultures will be done.  Will continue with IV Van/Zosyn per pharmacy dosing and get wound consult.  I suspect she will need debridement of her wound as well, so please consult ortho or general surgery tomorrow.  The rest of her medical problems are stable and her meds were continued.  With respect to her hx of pulmonary nodules, she did see Dr Gwenette Greet, and he didn't feel it was lymphoma, but  likely sarcoidosis, and she didn't want Bx.  She was scheduled to have a repeat CT in Oct of last year, but missed it.  I will obtain a chest CT with NO CONTRAST to follow up on theses nodules.  She is stable, full code, and will be admitted to [facility] service.  Thank you for letting me participate in her care.  Other plans as per orders.  Code Status: FULL Haskel Khan, MD. [facility] Pager [phone] 7pm to 7am.  10/08/2013, 2:54 AM

## 2013-10-08 NOTE — Progress Notes (Signed)
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 She admits to decreased by mouth intake of the past few days along with nausea vomiting.Her L. leg showed foul smelling purulent discharge, with blistering of the skin, edematous, and deep circumferential ulcers. She states she feels better today-some nausea but no further vomiting. will change her IV fluids to bicarbonate infusion, follow up on pending renal ultrasound. Will also obtain ABI of lower extremities follow and consult vascular/Dr. Early who she has seen in the past for further evaluation and management. Otherwise Continue current empiric antibiotics and management plan as per Dr. Truman Hayward and follow.    [facility] Pager [phone]. If 7PM-7AM, please contact night-coverage at [email or web address], password [facility] 10/08/2013, 2:08 PM  LOS: 0 days

## 2013-10-08 NOTE — ED Notes (Signed)
Attempted to draw blood for blood culture, unable to obtain. ED phlebotomist to try.

## 2013-10-08 NOTE — Progress Notes (Signed)
INITIAL NUTRITION ASSESSMENT  DOCUMENTATION CODES Per approved criteria  -Morbid Obesity   INTERVENTION: Encourage adequate PO intake as tolerated Provide Multivitamin with minerals daily RD to continue to monitor nutrition care plan  NUTRITION DIAGNOSIS: Increased nutrient needs related to wound healing as evidenced by estimated energy/nutrient needs.   Goal: Pt to meet >/= 90% of their estimated nutrition needs   Monitor:  PO intake Weight Labs  Reason for Assessment: Malnutrition Screening Tool, score of 3  52 y.o. female  Admitting Dx: ARF (acute renal failure)  ASSESSMENT: 52 y.o. 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 She has not been taking well orally. Evaluation in the ER showed Cr to be 6.24 with BUN 115, WBC of 25K, K of 5.3, and Na of 127. Her legs showed foul smelling purulent discharge, with blistering of the skin, edematous, and deep circumferential ulcers.   Pt nauseous and sleepy at time of visit. She reports having nausea the past 2 days and decreased PO intake for the past week. She states she usually weighs 318 lbs- no wt loss. She reports eating grits and a few grapes at breakfast this morning.   Height: Ht Readings from Last 1 Encounters:  10/08/13 5\' 7"  (1.702 m)    Weight: Wt Readings from Last 1 Encounters:  10/08/13 318 lb (144.244 kg)    Ideal Body Weight: 135 lbs  % Ideal Body Weight: 235%  Wt Readings from Last 10 Encounters:  10/08/13 318 lb (144.244 kg)  01/05/13 319 lb (144.697 kg)  10/28/12 309 lb (140.161 kg)  10/16/12 309 lb 12.8 oz (140.524 kg)  10/07/12 307 lb 6.4 oz (139.436 kg)  09/16/12 314 lb (142.429 kg)  09/02/12 311 lb 4.8 oz (141.205 kg)  07/29/12 317 lb (143.79 kg)  07/08/12 319 lb 12.8 oz (145.06  kg)  06/24/12 326 lb 6.4 oz (148.054 kg)    Usual Body Weight: 318 lbs  % Usual Body Weight: 100%  BMI:  Body mass index is 49.79 kg/(m^2).  Estimated Nutritional Needs: Kcal: 2200-2600 Protein: 100-115 grams Fluid: 1200 ml fluid restriction  Skin: non-pitting RLE and LLE edema; BLE venous statis wounds; small stage 2 ulcer to left and right buttocks  Diet Order: Renal  EDUCATION NEEDS: -No education needs identified at this time   Intake/Output Summary (Last 24 hours) at 10/08/13 1257 Last data filed at 10/08/13 0636  Gross per 24 hour  Intake    320 ml  Output    100 ml  Net    220 ml    Last BM: 10/15  Labs:   Recent Labs Lab 10/08/13 0119  NA 127*  K 5.3*  CL 93*  CO2 15*  BUN 118*  CREATININE 6.24*  CALCIUM 9.5  GLUCOSE 125*    CBG (last 3)  No results found for this basename: GLUCAP,  in the last 72 hours  Scheduled Meds: . amLODipine  5 mg Oral Daily  . docusate sodium  100 mg Oral BID  . heparin  5,000 Units Subcutaneous Q8H  . piperacillin-tazobactam (ZOSYN)  IV  2.25 g Intravenous Q6H  . [START ON 10/10/2013] vancomycin  2,000 mg Intravenous Q48H  . vancomycin  1,000 mg Intravenous Once    Continuous Infusions: .  sodium bicarbonate infusion 1/4 NS 1000 mL  Past Medical History  Diagnosis Date  . Hypertension   . Depression   . Asthma   . Ulcer     left posterior calf  . Headache(784.0)   . Hyperlipidemia   . Lung nodules     Past Surgical History  Procedure Laterality Date  . No past surgeries      Pryor Ochoa RD, LDN Inpatient Clinical Dietitian Pager: [phone] After Hours Pager: [phone]

## 2013-10-08 NOTE — Progress Notes (Signed)
Clinical Social Work Department BRIEF PSYCHOSOCIAL ASSESSMENT 10/08/2013  Patient:  Kari [name], Kari [name]     Account Number:  [id number]     Admit date:  10/08/2013  Clinical Social Worker:  Earlie Server  Date/Time:  10/08/2013 03:00 PM  Referred by:  RN  Date Referred:  10/08/2013 Referred for  Psychosocial assessment   Other Referral:   Interview type:  Patient Other interview type:   Aunt involved in assessment    PSYCHOSOCIAL DATA Living Status:  ALONE Admitted from facility:   Level of care:   Primary support name:  Kari [name] Primary support relationship to patient:  FAMILY Degree of support available:   Strong    CURRENT CONCERNS Current Concerns  Post-Acute Placement  Financial Resources   Other Concerns:    SOCIAL WORK ASSESSMENT / PLAN CSW received referral to meet with patient. CSW reviewed chart and met with patient and aunt at bedside. CSW introduced myself and explained role. Patient open to assessment and wants aunt involved during session.    Patient was living at home but reports that she plans to move in with aunt at [place]. Patient reports that she was living alone but feels she needs more assistance. Elenor Legato is a retired [employer] scientist and reports she feels she can care for patient at DC but was interested in what [facility] would be available at [place]. CSW made referral to CM.    CSW spoke with patient about SNF placement. Patient reports that she does not want to go to SNF and feels aunt can care for her. CSW agreeable to continue to follow to assist as needed with placement.    CSW spoke with patient regarding further resources. Patient has Medicaid application and reports she will apply as soon as possible. CSW also provided information for Social Security Administration to answer questions regarding disability.    CSW will continue to follow.   Assessment/plan status:  Psychosocial Support/Ongoing Assessment of Needs Other assessment/ plan:   Information/referral to  community resources:   SNF information  Social Security Administration    PATIENT'S/FAMILY'S RESPONSE TO PLAN OF CARE: Patient alert but drowsy. Patient and aunt open to session and asked appropriate questions. Patient thanked CSW for time and agreeable for CSW follow up during hospitalization.       [place], [place] [phone]

## 2013-10-08 NOTE — Progress Notes (Signed)
ANTIBIOTIC CONSULT NOTE - INITIAL  Pharmacy Consult for Vancomycin and Zosyn  Indication: Cellulitis and stasis ulcer of both legs   No Known Allergies  Patient Measurements: Height: 5\' 7"  (170.2 cm) Weight: 318 lb (144.244 kg) IBW/kg (Calculated) : 61.6 Adjusted Body Weight:   Vital Signs: Temp: 98.1 F (36.7 C) (10/16 0417) Temp src: Oral (10/16 0417) BP: 99/63 mmHg (10/16 0417) Pulse Rate: 95 (10/16 0417) Intake/Output from previous day: 10/15 0701 - 10/16 0700 In: -  Out: 100 [Urine:100] Intake/Output from this shift: Total I/O In: -  Out: 100 [Urine:100]  Labs:  Recent Labs  10/08/13 0119  WBC 25.8*  HGB 10.5*  PLT 452*  CREATININE 6.24*   Estimated Creatinine Clearance: 15.9 ml/min (by C-G formula based on Cr of 6.24). No results found for this basename: VANCOTROUGH, VANCOPEAK, VANCORANDOM, GENTTROUGH, GENTPEAK, GENTRANDOM, TOBRATROUGH, TOBRAPEAK, TOBRARND, AMIKACINPEAK, AMIKACINTROU, AMIKACIN,  in the last 72 hours   Microbiology: No results found for this or any previous visit (from the past 720 hour(s)).  Medical History: Past Medical History  Diagnosis Date  . Hypertension   . Depression   . Asthma   . Ulcer     left posterior calf  . Headache(784.0)   . Hyperlipidemia   . Lung nodules     Medications:  Anti-infectives   Start     Dose/Rate Route Frequency Ordered Stop   10/09/13 0600  vancomycin (VANCOCIN) 1,500 mg in sodium chloride 0.9 % 500 mL IVPB  Status:  Discontinued     1,500 mg 250 mL/hr over 120 Minutes Intravenous Every 24 hours 10/08/13 0558 10/08/13 0601   10/09/13 0600  vancomycin (VANCOCIN) 1,500 mg in sodium chloride 0.9 % 500 mL IVPB     1,500 mg 250 mL/hr over 120 Minutes Intravenous Every 48 hours 10/08/13 0601     10/08/13 1200  piperacillin-tazobactam (ZOSYN) IVPB 2.25 g     2.25 g 100 mL/hr over 30 Minutes Intravenous 4 times per day 10/08/13 0558     10/08/13 0230  vancomycin (VANCOCIN) IVPB 1000 mg/200 mL premix      1,000 mg 200 mL/hr over 60 Minutes Intravenous  Once 10/08/13 0215 10/08/13 0541   10/08/13 0230  piperacillin-tazobactam (ZOSYN) IVPB 3.375 g     3.375 g 100 mL/hr over 30 Minutes Intravenous  Once 10/08/13 0215       Assessment: Patient with Cellulitis and stasis ulcer of both legs.  First dose of antibiotics already given.  Patient with very poor renal function <7mL/min.  Goal of Therapy:  Vancomycin trough level 15-20 mcg/ml Zosyn based on renal function   Plan:  Measure antibiotic drug levels at steady state Follow up culture results . Zosyn 2.25gm iv q6hr Vancomycin 1500mg  iv q48hr (next dose 10/17 0600)  Tyler Deis, Shea Stakes [place] 10/08/2013,6:03 AM

## 2013-10-08 NOTE — Consult Note (Signed)
WOC wound consult note Reason for Consult:Nilateral LE ulcerations (Chronic, non-healing) will cellulitis Wound type:infectious with venous insufficiency Pressure Ulcer POA: No Measurement:(Approximate, patient is in pain at the time of examination).  Left:  20cm x 20cm wth 0.2cm depth, left medial foot: 5cm x 10cm yellow, fluid-filled blister.  Right LE:  14cm x 10cm x 0.2cm Wound bed: 90% covered with necrotic, yellow slough Drainage (amount, consistency, odor) moderate amount strong-smelling exudate. Periwound:macerated Dressing procedure/placement/frequency:I will employ a conservative POC until patient can be evaluated by VVS and/or ID. If you agree, please order. Patient reports that this is a chronic condition for which she has seen Dr.Early in the past, but not this year.  Patient reports being seen at the Outpatient [facility] center, but not since this past Spring. I will cover wounds with xeroform, pad with ABDs and wrap with Kerlix, change BID. [facility] nursing team will not follow, but will remain available to this patient, the nursing and medical team.  Please re-consult if needed. Thanks, Maudie Flakes, MSN, RN, [place], [place], [place] [phone])

## 2013-10-09 DIAGNOSIS — L97909 Non-pressure chronic ulcer of unspecified part of unspecified lower leg with unspecified severity: Secondary | ICD-10-CM

## 2013-10-09 DIAGNOSIS — I83009 Varicose veins of unspecified lower extremity with ulcer of unspecified site: Secondary | ICD-10-CM

## 2013-10-09 DIAGNOSIS — M79609 Pain in unspecified limb: Secondary | ICD-10-CM

## 2013-10-09 LAB — URINE CULTURE
Colony Count: NO GROWTH
Culture: NO GROWTH

## 2013-10-09 LAB — CBC WITH DIFFERENTIAL/PLATELET
Basophils Absolute: 0 10*3/uL (ref 0.0–0.1)
Basophils Relative: 0 % (ref 0–1)
Eosinophils Absolute: 0.1 10*3/uL (ref 0.0–0.7)
Eosinophils Relative: 0 % (ref 0–5)
HCT: 24.3 % — ABNORMAL LOW (ref 36.0–46.0)
Hemoglobin: 8.2 g/dL — ABNORMAL LOW (ref 12.0–15.0)
Lymphocytes Relative: 8 % — ABNORMAL LOW (ref 12–46)
Lymphs Abs: 1.4 10*3/uL (ref 0.7–4.0)
MCH: 28 pg (ref 26.0–34.0)
MCHC: 33.7 g/dL (ref 30.0–36.0)
MCV: 82.9 fL (ref 78.0–100.0)
Monocytes Absolute: 1.1 10*3/uL — ABNORMAL HIGH (ref 0.1–1.0)
Monocytes Relative: 6 % (ref 3–12)
Neutro Abs: 14.7 10*3/uL — ABNORMAL HIGH (ref 1.7–7.7)
Neutrophils Relative %: 86 % — ABNORMAL HIGH (ref 43–77)
Platelets: 376 10*3/uL (ref 150–400)
RBC: 2.93 MIL/uL — ABNORMAL LOW (ref 3.87–5.11)
RDW: 16.8 % — ABNORMAL HIGH (ref 11.5–15.5)
WBC: 17.2 10*3/uL — ABNORMAL HIGH (ref 4.0–10.5)

## 2013-10-09 LAB — BASIC METABOLIC PANEL
BUN: 120 mg/dL — ABNORMAL HIGH (ref 6–23)
CO2: 19 mEq/L (ref 19–32)
Calcium: 8.6 mg/dL (ref 8.4–10.5)
Chloride: 100 mEq/L (ref 96–112)
Creatinine, Ser: 4.64 mg/dL — ABNORMAL HIGH (ref 0.50–1.10)
GFR calc Af Amer: 12 mL/min — ABNORMAL LOW (ref >90)
GFR calc non Af Amer: 10 mL/min — ABNORMAL LOW (ref >90)
Glucose, Bld: 105 mg/dL — ABNORMAL HIGH (ref 70–99)
Potassium: 4.9 mEq/L (ref 3.5–5.1)
Sodium: 133 mEq/L — ABNORMAL LOW (ref 135–145)

## 2013-10-09 MED ORDER — DIPHENHYDRAMINE HCL 25 MG PO CAPS
25.0000 mg | ORAL_CAPSULE | Freq: Three times a day (TID) | ORAL | Status: DC | PRN
  Administered 2013-10-09 – 2013-10-13 (×5): 25 mg via ORAL
  Filled 2013-10-09 (×6): qty 1

## 2013-10-09 MED ORDER — BOOST / RESOURCE BREEZE PO LIQD
1.0000 | Freq: Two times a day (BID) | ORAL | Status: DC
  Administered 2013-10-09 – 2013-10-14 (×8): 1 via ORAL

## 2013-10-09 NOTE — Progress Notes (Signed)
CARE MANAGEMENT NOTE 10/09/2013  Patient:  Kari [name], Kari [name]   Account Number:  [id number]  Date Initiated:  10/09/2013  Documentation initiated by:  [facility]  Subjective/Objective Assessment:   52 year old female admitted with ARF, cellulitis and abscesses.     Action/Plan:   From home, needs HH services at d/c.   Anticipated DC Date:  10/12/2013   Anticipated DC Plan:  [place]  In-house referral  Clinical Social Worker      DC Planning Services  CM consult      Choice offered to / List presented to:  C-1 Patient  [place] arranged  HH-1 RN  [place].   Status of service:  In process, will continue to follow  Medicare Important Message given?  NA - LOS <3 / Initial given by admissions  Discharge Disposition:    Per UR Regulation:  Reviewed for med. necessity/level of care/duration of stay  Comments:  10/08/13 Allene Dillon RN BSN [phone] PT is recemmending SNF but pt wants to go home with [facility] services and family support. She has chosen [facility] to provide the services. She will need HHRN and aide. Medicaid will not pay for HHPT services. MD needs to order [facility] services at d/c.

## 2013-10-09 NOTE — Progress Notes (Signed)
Kari [name] [id number] DOB: 01/10/1961 DOA: 10/08/2013 PCP: Maggie Font, MD  Brief narrative: 52 year old ?, known history of chronic venous stasis ulcers followed by Dr. early of vascular surgery, mild obstructive sleep apnea, noncardiac chest pain,? Sarcoid admitted 10/08/2013 with worsening of left lower extremity purulent discharge, acute kidney injury, creatinine on admission 6.2, baseline 0.86, WBC 25.8 on admission hemoglobin 10.5.  Workup for acute renal failure showed a large cystic structure in the pelvis which could have represented a urinary bladder.   Past medical history-As per Problem list Chart reviewed as below-Consultants:  Vascular surgery  Procedures: Renal US   Antibiotics:  Zosyn 10/15  Vancomycin 10/15   Subjective  States much better. States she hasn't seen a wound physician in about 5-6 months. I given any antibiotics recently. States nausea and vomiting that she had initially as well as abdominal pain have resolved. Tolerating diet, tolerating fluids Wants to sit out of bed. Has Foley catheter in place    Objective    Interim History: None  Telemetry: None telemetry   Objective: Filed Vitals:   10/08/13 1300 10/08/13 1835 10/08/13 2139 10/09/13 0510  BP: 93/57 94/56 96/56  101/53  Pulse: 85 88 77 79  Temp: 98.2 F (36.8 C) 98.9 F (37.2 C) 98.5 F (36.9 C) 98.6 F (37 C)  TempSrc: Oral Oral Oral Oral  Resp: 22 20 20 20   Height:      Weight:      SpO2: 95% 92% 98% 96%    Intake/Output Summary (Last 24 hours) at 10/09/13 1032 Last data filed at 10/09/13 0500  Gross per 24 hour  Intake 1993.42 ml  Output   1200 ml  Net 793.42 ml    Exam:  General: Morbid obesity, Body mass index is 49.79 kg/(m^2)., Mallampati 3 Cardiovascular: S1-S2 no murmur rub or gallop Respiratory: Clinically clear heart exam limited habitus  Abdomen: Obese nondistended no tenderness  Skinlower extremities not examined as just wrapped this  morning by nursing however significant odor on entering the room  Neurorange of motion intact  Data Reviewed: Basic Metabolic Panel:  Recent Labs Lab 10/08/13 0119 10/09/13 0545  NA 127* 133*  K 5.3* 4.9  CL 93* 100  CO2 15* 19  GLUCOSE 125* 105*  BUN 118* 120*  CREATININE 6.24* 4.64*  CALCIUM 9.5 8.6   Liver Function Tests:  Recent Labs Lab 10/08/13 0119  AST 9  ALT 12  ALKPHOS 115  BILITOT 0.8  PROT 9.5*  ALBUMIN 2.2*   No results found for this basename: LIPASE, AMYLASE,  in the last 168 hours No results found for this basename: AMMONIA,  in the last 168 hours CBC:  Recent Labs Lab 10/08/13 0119 10/09/13 0857  WBC 25.8* 17.2*  NEUTROABS 23.8* 14.7*  HGB 10.5* 8.2*  HCT 30.3* 24.3*  MCV 83.7 82.9  PLT 452* 376   Cardiac Enzymes: No results found for this basename: CKTOTAL, CKMB, CKMBINDEX, TROPONINI,  in the last 168 hours BNP: No components found with this basename: POCBNP,  CBG: No results found for this basename: GLUCAP,  in the last 168 hours  Recent Results (from the past 240 hour(s))  CULTURE, BLOOD (ROUTINE X 2)     Status: None   Collection Time    10/08/13  2:37 AM      Result Value Range Status   Specimen Description BLOOD RAC   Final   Special Requests BOTTLES DRAWN AEROBIC AND ANAEROBIC [place]   Final   Culture  Setup Time     Final   Value: 10/08/2013 09:37     Performed at [facility]   Culture     Final   Value:        BLOOD CULTURE RECEIVED NO GROWTH TO DATE CULTURE WILL BE HELD FOR 5 DAYS BEFORE ISSUING A FINAL NEGATIVE REPORT     Performed at [facility]   Report Status PENDING   Incomplete  CULTURE, BLOOD (ROUTINE X 2)     Status: None   Collection Time    10/08/13  3:06 AM      Result Value Range Status   Specimen Description BLOOD RIGHT FOREARM   Final   Special Requests BOTTLES DRAWN AEROBIC AND ANAEROBIC 10CC   Final   Culture  Setup Time     Final   Value: 10/08/2013 09:37     Performed at [facility]   Culture     Final   Value:        BLOOD CULTURE RECEIVED NO GROWTH TO DATE CULTURE WILL BE HELD FOR 5 DAYS BEFORE ISSUING A FINAL NEGATIVE REPORT     Performed at [facility]   Report Status PENDING   Incomplete  URINE CULTURE     Status: None   Collection Time    10/08/13  5:07 AM      Result Value Range Status   Specimen Description URINE, CATHETERIZED   Final   Special Requests NONE   Final   Culture  Setup Time     Final   Value: 10/08/2013 09:50     Performed at [place]     Final   Value: NO GROWTH     Performed at [facility]   Culture     Final   Value: NO GROWTH     Performed at [facility]   Report Status 10/09/2013 FINAL   Final     Studies:              All Imaging reviewed and is as per above notation   Scheduled Meds: . docusate sodium  100 mg Oral BID  . heparin  5,000 Units Subcutaneous Q8H  . multivitamin with minerals  1 tablet Oral Daily  . piperacillin-tazobactam (ZOSYN)  IV  2.25 g Intravenous Q6H  . [START ON 10/10/2013] vancomycin  2,000 mg Intravenous Q48H   Continuous Infusions: .  sodium bicarbonate infusion 1/4 NS 1000 mL 125 mL/hr at 10/09/13 0225     Assessment/Plan: 1. Acute kidney injury-secondary to potential sepsis vs. poor by mouth intake as well as nausea vomiting-she's probably hypervolemic hyponatremic as well. Continue bicarbonate infusion for renal protection, creatinine has trended down well from 6.24--4.64. Renal ultrasound concerning for cystic mass however at this point as putting out good urine, would hold workup for now 2.  severe sepsis secondary to lower extremity venous ulcers-have consulted Dr. early who will be seeing patient in consult. I cannot appreciate any dorsalis pedis or posterior tibial pulses. Hopefully the limbal be salvageable. Will narrow from vancomycin and Zosyn to Zosyn and Keflex probably in the morning if she continues to make a recovery.  White count is trending downwards 3. Mild obstructive sleep apnea-currently stable. Probably does not need oxygen 4. ? Sarcoidosis-vision has had discussion with Dr. Marcia Brash pulmonary earlier this year and elected not to have any further workup done. Outpatient pulmonary followup needed with regards to interval CT scan  monitoring etc. 5. Hypertension-now to moderately hypotensive. Her amlodipine 5 mg as well as losartan 100 mg appropriately been held. Would recommend discontinuation of ARB until seen by primary care physician once again 6. Morbid obesity, Body mass index is 49.79 kg/(m^2). this is potentially life limiting and patient will need counseling with regards to this as well as potential bariatric surgery if she can tolerate the same subsequently.  Code Status: Full Family Communication: Discussed with both sisters at bedside Disposition Plan: Inpatient   Verneita Griffes, MD  [facility] Pager [phone] 10/09/2013, 10:32 AM    LOS: 1 day

## 2013-10-09 NOTE — Consult Note (Signed)
[facility]   Patient name: Kari [name] MRN: [id number] DOB: 05-10-1961 Sex: female   Referred by: Verlon Au  Reason for referral:  Chief Complaint  Patient presents with  . Nausea  . Fatigue  . Wound Infection    HISTORY OF PRESENT ILLNESS: Vascular was asked to consult on this for severe bilateral venous stasis ulceration. She is known to me from prior evaluation in our office. She had been seen at the wound center with inability treatment. She is poorly compliant. I had last seen her one year ago at which time she was having improvement with Unna boot therapy. She presented to the hospital with the worsening pain and erythema with ease and was admitted for antibiotic treatment. He denies any fevers or signs of sepsis. Does have worsening pain. Her past studies in our office had revealed a severe deep venous reflux. No significant superficial medial venous reflux.  Past Medical History  Diagnosis Date  . Hypertension   . Depression   . Asthma   . Ulcer     left posterior calf  . Headache(784.0)   . Hyperlipidemia   . Lung nodules     Past Surgical History  Procedure Laterality Date  . No past surgeries      History   Social History  . Marital Status: Single    Spouse Name: N/A    Number of Children: 1  . Years of Education: N/A   Occupational History  . Not on file.   Social History Main Topics  . Smoking status: Never Smoker   . Smokeless tobacco: Never Used  . Alcohol Use: No  . Drug Use: No  . Sexual Activity: Not Currently   Other Topics Concern  . Not on file   Social History Narrative  . No narrative on file    Family History  Problem Relation Age of Onset  . Heart failure Mother   . Heart disease Father     Allergies as of 10/08/2013  . (No Known Allergies)    No current facility-administered medications on file prior to encounter.   Current Outpatient Prescriptions on File Prior to Encounter   Medication Sig Dispense Refill  . amLODipine (NORVASC) 5 MG tablet Take 5 mg by mouth daily.      Marland Kitchen losartan (COZAAR) 100 MG tablet Take 100 mg by mouth daily.      Marland Kitchen albuterol (PROVENTIL HFA;VENTOLIN HFA) 108 (90 BASE) MCG/ACT inhaler Inhale 2 puffs into the lungs every 6 (six) hours as needed for shortness of breath.         REVIEW OF SYSTEMS:  Reviewed in her history and physical with nothing to add. Negative except for past medical history  PHYSICAL EXAMINATION:  General: The patient is a well-nourished female, in no acute distress. Vital signs are BP 101/53  Pulse 79  Temp(Src) 98.6 F (37 C) (Oral)  Resp 20  Ht 5\' 7"  (1.702 m)  Wt 318 lb (144.244 kg)  BMI 49.79 kg/m2  SpO2 96%  LMP 11/21/2011 Pulmonary: There is a good air exchange  Musculoskeletal: There are no major deformities.   Neurologic: No focal weakness or paresthesias are detected, Skin: Bilateral circumferential ulcerations. This is more extensive on the right than on the left. There are several isolated islands of ulcers on the left lateral leg and posteriorly. On the right there is superficial involvement from below the knee down onto the medial aspect of the ankle. This is all quite indurated  and tender and does all appear to be partial thickness skin loss only. She has marked lower extremity edema bilaterally Psychiatric: The patient has normal affect. Cardiovascular:  2+ dorsalis pedis pulses bilaterally   Vascular Lab Studies:  Arterial duplex pending but suspect this will be normal with normal dorsalis pedis pulses  Impression and Plan:  Severe venous stasis disease with poor compliance. Past studies have shown deep venous reflux with no correctable situation. I again stressed with the patient the critical portion importance of elevation with her feet higher than her heart and also the critical importance of continuous compression. I will change the dressing to Silvadene with Curlex and Ace wrap. This  resident bed rest with legs elevated except for bathroom privileges. Will not follow actively. Can followup in the wound center as preoperative    Deloris Mittag [facility] [place]: [phone]

## 2013-10-09 NOTE — Progress Notes (Signed)
VASCULAR LAB PRELIMINARY  ARTERIAL  ABI completed:    RIGHT    LEFT    PRESSURE WAVEFORM  PRESSURE WAVEFORM  BRACHIAL 104 Tri BRACHIAL 95 Tri  DP   DP    AT 101 mono AT 108 Bi  PT 90 Bi PT 100 BI  PER   PER    GREAT TOE  NA GREAT TOE  NA    RIGHT LEFT  ABI 0.97 1.04    --Monophasic waveform of Right ATA may be due to poor Doppler signal from edema.    Landry Mellow, RDMS, RVT  10/09/2013, 11:42 AM

## 2013-10-09 NOTE — Progress Notes (Signed)
Clinical Social Work  CSW met with patient at bedside. Patient reports she has spoken to [facility] and feels she can manage at home with her aunt and HH. CSW offered SNF option but patient continues to refuse placement. Patient reports that her family is supportive and that she has no safety concerns returning home. CSW is signing off but available if further needs arise.  [place], [place] [phone]

## 2013-10-10 LAB — COMPREHENSIVE METABOLIC PANEL
ALT: 19 U/L (ref 0–35)
AST: 56 U/L — ABNORMAL HIGH (ref 0–37)
Albumin: 1.6 g/dL — ABNORMAL LOW (ref 3.5–5.2)
Alkaline Phosphatase: 85 U/L (ref 39–117)
BUN: 106 mg/dL — ABNORMAL HIGH (ref 6–23)
CO2: 23 mEq/L (ref 19–32)
Calcium: 8.4 mg/dL (ref 8.4–10.5)
Chloride: 96 mEq/L (ref 96–112)
Creatinine, Ser: 3.27 mg/dL — ABNORMAL HIGH (ref 0.50–1.10)
GFR calc Af Amer: 18 mL/min — ABNORMAL LOW (ref >90)
GFR calc non Af Amer: 15 mL/min — ABNORMAL LOW (ref >90)
Glucose, Bld: 117 mg/dL — ABNORMAL HIGH (ref 70–99)
Potassium: 4.2 mEq/L (ref 3.5–5.1)
Sodium: 132 mEq/L — ABNORMAL LOW (ref 135–145)
Total Bilirubin: 1.7 mg/dL — ABNORMAL HIGH (ref 0.3–1.2)
Total Protein: 7.5 g/dL (ref 6.0–8.3)

## 2013-10-10 LAB — CBC
HCT: 24.7 % — ABNORMAL LOW (ref 36.0–46.0)
Hemoglobin: 8.3 g/dL — ABNORMAL LOW (ref 12.0–15.0)
MCH: 28 pg (ref 26.0–34.0)
MCHC: 33.6 g/dL (ref 30.0–36.0)
MCV: 83.4 fL (ref 78.0–100.0)
Platelets: 402 10*3/uL — ABNORMAL HIGH (ref 150–400)
RBC: 2.96 MIL/uL — ABNORMAL LOW (ref 3.87–5.11)
RDW: 16.6 % — ABNORMAL HIGH (ref 11.5–15.5)
WBC: 16.2 10*3/uL — ABNORMAL HIGH (ref 4.0–10.5)

## 2013-10-10 MED ORDER — SILVER SULFADIAZINE 1 % EX CREA
TOPICAL_CREAM | Freq: Two times a day (BID) | CUTANEOUS | Status: DC
  Administered 2013-10-10 – 2013-10-13 (×7): via TOPICAL
  Filled 2013-10-10 (×5): qty 50
  Filled 2013-10-10: qty 400

## 2013-10-10 MED ORDER — SODIUM CHLORIDE 0.9 % IV SOLN
INTRAVENOUS | Status: DC
  Administered 2013-10-10: 13:00:00 via INTRAVENOUS

## 2013-10-10 NOTE — Progress Notes (Signed)
Kari [name] [id number] DOB: 1961-08-07 DOA: 10/08/2013 PCP: Maggie Font, MD  Brief narrative: 52 year old ?, known history of chronic venous stasis ulcers followed by Dr. early of vascular surgery, mild obstructive sleep apnea, noncardiac chest pain,? Sarcoid admitted 10/08/2013 with worsening of left lower extremity purulent discharge, acute kidney injury, creatinine on admission 6.2, baseline 0.86, WBC 25.8 on admission hemoglobin 10.5.  Workup for acute renal failure showed a large cystic structure in the pelvis which could have represented a urinary bladder.   Past medical history-As per Problem list Chart reviewed as below-Consultants:  Vascular surgery  Procedures: Renal US   Antibiotics:  Zosyn 10/15  Vancomycin 10/15   Subjective  States much better. Family in room. Understands need to keep her legs elevated once be out of bed because back pain Tolerating diet No chest pain No shortness breath No fever or chills    Objective    Interim History: None  Telemetry: None telemetry   Objective: Filed Vitals:   10/09/13 1421 10/09/13 2200 10/10/13 0600 10/10/13 1400  BP: 98/58 90/55 92/48  104/57  Pulse: 84 87 86 75  Temp: 98.8 F (37.1 C) 99.9 F (37.7 C) 98 F (36.7 C) 98.7 F (37.1 C)  TempSrc: Oral Oral Oral Oral  Resp: 18 16 16 20   Height:      Weight:      SpO2: 95% 92% 94% 97%    Intake/Output Summary (Last 24 hours) at 10/10/13 1631 Last data filed at 10/10/13 1348  Gross per 24 hour  Intake      0 ml  Output   1800 ml  Net  -1800 ml    Exam:  General: Morbid obesity, Body mass index is 49.79 kg/(m^2)., Mallampati 3 Cardiovascular: S1-S2 no murmur rub or gallop Respiratory: Clinically clear heart exam limited habitus  Abdomen: Obese nondistended no tenderness  Skinlower extremities not examined as just wrapped this morning by nursing however significant odor on entering the room  Neuro range of motion intact  Data  Reviewed: Basic Metabolic Panel:  Recent Labs Lab 10/08/13 0119 10/09/13 0545 10/10/13 0824  NA 127* 133* 132*  K 5.3* 4.9 4.2  CL 93* 100 96  CO2 15* 19 23  GLUCOSE 125* 105* 117*  BUN 118* 120* 106*  CREATININE 6.24* 4.64* 3.27*  CALCIUM 9.5 8.6 8.4   Liver Function Tests:  Recent Labs Lab 10/08/13 0119 10/10/13 0824  AST 9 56*  ALT 12 19  ALKPHOS 115 85  BILITOT 0.8 1.7*  PROT 9.5* 7.5  ALBUMIN 2.2* 1.6*   No results found for this basename: LIPASE, AMYLASE,  in the last 168 hours No results found for this basename: AMMONIA,  in the last 168 hours CBC:  Recent Labs Lab 10/08/13 0119 10/09/13 0857 10/10/13 0824  WBC 25.8* 17.2* 16.2*  NEUTROABS 23.8* 14.7*  --   HGB 10.5* 8.2* 8.3*  HCT 30.3* 24.3* 24.7*  MCV 83.7 82.9 83.4  PLT 452* 376 402*   Cardiac Enzymes: No results found for this basename: CKTOTAL, CKMB, CKMBINDEX, TROPONINI,  in the last 168 hours BNP: No components found with this basename: POCBNP,  CBG: No results found for this basename: GLUCAP,  in the last 168 hours  Recent Results (from the past 240 hour(s))  CULTURE, BLOOD (ROUTINE X 2)     Status: None   Collection Time    10/08/13  2:37 AM      Result Value Range Status   Specimen Description BLOOD RAC  Final   Special Requests BOTTLES DRAWN AEROBIC AND ANAEROBIC [facility] EACH   Final   Culture  Setup Time     Final   Value: 10/08/2013 09:37     Performed at [facility]   Culture     Final   Value:        BLOOD CULTURE RECEIVED NO GROWTH TO DATE CULTURE WILL BE HELD FOR 5 DAYS BEFORE ISSUING A FINAL NEGATIVE REPORT     Performed at [facility]   Report Status PENDING   Incomplete  CULTURE, BLOOD (ROUTINE X 2)     Status: None   Collection Time    10/08/13  3:06 AM      Result Value Range Status   Specimen Description BLOOD RIGHT FOREARM   Final   Special Requests BOTTLES DRAWN AEROBIC AND ANAEROBIC 10CC   Final   Culture  Setup Time     Final   Value:  10/08/2013 09:37     Performed at [facility]   Culture     Final   Value:        BLOOD CULTURE RECEIVED NO GROWTH TO DATE CULTURE WILL BE HELD FOR 5 DAYS BEFORE ISSUING A FINAL NEGATIVE REPORT     Performed at [facility]   Report Status PENDING   Incomplete  URINE CULTURE     Status: None   Collection Time    10/08/13  5:07 AM      Result Value Range Status   Specimen Description URINE, CATHETERIZED   Final   Special Requests NONE   Final   Culture  Setup Time     Final   Value: 10/08/2013 09:50     Performed at [place]     Final   Value: NO GROWTH     Performed at [facility]   Culture     Final   Value: NO GROWTH     Performed at [facility]   Report Status 10/09/2013 FINAL   Final     Studies:              All Imaging reviewed and is as per above notation   Scheduled Meds: . docusate sodium  100 mg Oral BID  . feeding supplement (RESOURCE BREEZE)  1 Container Oral BID BM  . heparin  5,000 Units Subcutaneous Q8H  . multivitamin with minerals  1 tablet Oral Daily  . piperacillin-tazobactam (ZOSYN)  IV  2.25 g Intravenous Q6H  . silver sulfADIAZINE   Topical BID  . vancomycin  2,000 mg Intravenous Q48H   Continuous Infusions: . sodium chloride 75 mL/hr at 10/10/13 1238  .  sodium bicarbonate infusion 1/4 NS 1000 mL 125 mL/hr at 10/10/13 0640     Assessment/Plan: 1. Acute kidney injury-secondary to potential sepsis vs. poor by mouth intake as well as nausea vomiting-she's probably hypervolemic hyponatremic as well. Bicarbonate discontinued 10/70 Creatinine has trended down well from 6.24--4.64--->3.27 continue saline at 75 cc an hour  2. Severe sepsis secondary to lower extremity venous ulcers-appreciate consult from Dr. Donnetta Hutching.  Nonoperative management recommended -recommended Silvadene dressings with Kerlix and Ace wrap and followup with wound center 3. Mild obstructive sleep apnea-currently stable.  Probably does not need oxygen 4. Dilutional anemia-hemoglobin stable at 8.3. If her stool noted will workup otherwise monitor 5. ? Sarcoidosis-vision has had discussion with Dr. Danton Sewer pulmonary earlier this year and elected not to have any  further workup done. Outpatient pulmonary followup needed with regards to interval CT scan monitoring etc. 6. Hypertension-now to moderately hypotensive. Her amlodipine 5 mg as well as losartan 100 mg appropriately been held. Would recommend discontinuation of ARB until seen by primary care physician once again 7. Morbid obesity, Body mass index is 49.79 kg/(m^2). this is potentially life limiting and patient will need counseling with regards to this as well as potential bariatric surgery if she can tolerate the same subsequently.  Code Status: Full Family Communication: Discussed with family at bedside Disposition Plan: Inpatient   Verneita Griffes, MD  [facility] Pager [phone] 10/10/2013, 4:31 PM    LOS: 2 days

## 2013-10-11 LAB — CBC
HCT: 25.2 % — ABNORMAL LOW (ref 36.0–46.0)
Hemoglobin: 8.3 g/dL — ABNORMAL LOW (ref 12.0–15.0)
MCH: 27.9 pg (ref 26.0–34.0)
MCHC: 32.9 g/dL (ref 30.0–36.0)
MCV: 84.8 fL (ref 78.0–100.0)
Platelets: 428 10*3/uL — ABNORMAL HIGH (ref 150–400)
RBC: 2.97 MIL/uL — ABNORMAL LOW (ref 3.87–5.11)
RDW: 16.6 % — ABNORMAL HIGH (ref 11.5–15.5)
WBC: 14.2 10*3/uL — ABNORMAL HIGH (ref 4.0–10.5)

## 2013-10-11 LAB — RENAL FUNCTION PANEL
Albumin: 1.6 g/dL — ABNORMAL LOW (ref 3.5–5.2)
BUN: 90 mg/dL — ABNORMAL HIGH (ref 6–23)
CO2: 25 mEq/L (ref 19–32)
Calcium: 8.8 mg/dL (ref 8.4–10.5)
Chloride: 101 mEq/L (ref 96–112)
Creatinine, Ser: 2.66 mg/dL — ABNORMAL HIGH (ref 0.50–1.10)
GFR calc Af Amer: 23 mL/min — ABNORMAL LOW (ref >90)
GFR calc non Af Amer: 20 mL/min — ABNORMAL LOW (ref >90)
Glucose, Bld: 115 mg/dL — ABNORMAL HIGH (ref 70–99)
Phosphorus: 5.4 mg/dL — ABNORMAL HIGH (ref 2.3–4.6)
Potassium: 4.2 mEq/L (ref 3.5–5.1)
Sodium: 137 mEq/L (ref 135–145)

## 2013-10-11 LAB — VANCOMYCIN, RANDOM: Vancomycin Rm: 23.5 ug/mL

## 2013-10-11 MED ORDER — MORPHINE SULFATE 2 MG/ML IJ SOLN
1.0000 mg | Freq: Once | INTRAMUSCULAR | Status: AC
  Administered 2013-10-11: 2 mg via INTRAVENOUS
  Filled 2013-10-11: qty 1

## 2013-10-11 MED ORDER — LEVOFLOXACIN 750 MG PO TABS
750.0000 mg | ORAL_TABLET | ORAL | Status: DC
  Administered 2013-10-11: 750 mg via ORAL
  Filled 2013-10-11 (×2): qty 1

## 2013-10-11 MED ORDER — DOXYCYCLINE HYCLATE 100 MG PO TABS
100.0000 mg | ORAL_TABLET | Freq: Two times a day (BID) | ORAL | Status: DC
  Administered 2013-10-11 – 2013-10-14 (×6): 100 mg via ORAL
  Filled 2013-10-11 (×7): qty 1

## 2013-10-11 MED ORDER — HYDROCODONE-ACETAMINOPHEN 5-325 MG PO TABS
1.0000 | ORAL_TABLET | ORAL | Status: DC | PRN
  Administered 2013-10-11 – 2013-10-14 (×10): 2 via ORAL
  Filled 2013-10-11 (×2): qty 2
  Filled 2013-10-11: qty 1
  Filled 2013-10-11 (×4): qty 2
  Filled 2013-10-11: qty 1
  Filled 2013-10-11 (×4): qty 2

## 2013-10-11 MED ORDER — PIPERACILLIN-TAZOBACTAM 3.375 G IVPB
3.3750 g | Freq: Three times a day (TID) | INTRAVENOUS | Status: DC
  Filled 2013-10-11 (×2): qty 50

## 2013-10-11 NOTE — Progress Notes (Signed)
Kari [name] [id number] DOB: [birth year] DOA: 10/08/2013 PCP: Maggie Font, MD  Brief narrative: 52 year old ?, known history of chronic venous stasis ulcers followed by Dr. Donnetta Hutching of vascular surgery, mild obstructive sleep apnea, noncardiac chest pain,? Sarcoid admitted 10/08/2013 with worsening of left lower extremity purulent discharge, acute kidney injury, creatinine on admission 6.2, baseline 0.86, WBC 25.8 on admission hemoglobin 10.5.  Patient was seen by Dr. Donnetta Hutching and non-operative management and [facility] Care follow up was recommended.  Past medical history-As per Problem list Chart reviewed as below-Consultants:  Vascular surgery  Procedures: Renal US   Antibiotics:  Zosyn 10/15-10/19  Vancomycin 10/15-10/19  Levaquin 650 10/19  Doxycycline 100 10/19   Subjective  States feeling fair. Having lunch. Denies n/v/cp/sob Doesn't like laying down all the time   Objective    Interim History: None  Telemetry: None telemetry   Objective: Filed Vitals:   10/10/13 0600 10/10/13 1400 10/10/13 2128 10/11/13 0625  BP: 92/48 104/57 94/51 115/69  Pulse: 86 75 82 83  Temp: 98 F (36.7 C) 98.7 F (37.1 C) 98.1 F (36.7 C) 99.1 F (37.3 C)  TempSrc: Oral Oral Oral Oral  Resp: 16 20 18 18   Height:      Weight:      SpO2: 94% 97% 97% 94%    Intake/Output Summary (Last 24 hours) at 10/11/13 1342 Last data filed at 10/11/13 1100  Gross per 24 hour  Intake 1552.5 ml  Output   1400 ml  Net  152.5 ml    Exam:  General: Morbid obesity, Body mass index is 49.79 kg/(m^2)., Mallampati 3 Cardiovascular: S1-S2 no murmur rub or gallop Respiratory: Clinically clear heart exam limited habitus  Abdomen: Obese nondistended no tenderness  Skinlower extremities not examined Neuro range of motion intact  Data Reviewed: Basic Metabolic Panel:  Recent Labs Lab 10/08/13 0119 10/09/13 0545 10/10/13 0824 10/11/13 0515  NA 127* 133* 132* 137  K 5.3* 4.9 4.2 4.2   CL 93* 100 96 101  CO2 15* 19 23 25   GLUCOSE 125* 105* 117* 115*  BUN 118* 120* 106* 90*  CREATININE 6.24* 4.64* 3.27* 2.66*  CALCIUM 9.5 8.6 8.4 8.8  PHOS  --   --   --  5.4*   Liver Function Tests:  Recent Labs Lab 10/08/13 0119 10/10/13 0824 10/11/13 0515  AST 9 56*  --   ALT 12 19  --   ALKPHOS 115 85  --   BILITOT 0.8 1.7*  --   PROT 9.5* 7.5  --   ALBUMIN 2.2* 1.6* 1.6*   No results found for this basename: LIPASE, AMYLASE,  in the last 168 hours No results found for this basename: AMMONIA,  in the last 168 hours CBC:  Recent Labs Lab 10/08/13 0119 10/09/13 0857 10/10/13 0824 10/11/13 0515  WBC 25.8* 17.2* 16.2* 14.2*  NEUTROABS 23.8* 14.7*  --   --   HGB 10.5* 8.2* 8.3* 8.3*  HCT 30.3* 24.3* 24.7* 25.2*  MCV 83.7 82.9 83.4 84.8  PLT 452* 376 402* 428*   Cardiac Enzymes: No results found for this basename: CKTOTAL, CKMB, CKMBINDEX, TROPONINI,  in the last 168 hours BNP: No components found with this basename: POCBNP,  CBG: No results found for this basename: GLUCAP,  in the last 168 hours  Recent Results (from the past 240 hour(s))  CULTURE, BLOOD (ROUTINE X 2)     Status: None   Collection Time    10/08/13  2:37 AM  Result Value Range Status   Specimen Description BLOOD RAC   Final   Special Requests BOTTLES DRAWN AEROBIC AND ANAEROBIC [facility] EACH   Final   Culture  Setup Time     Final   Value: 10/08/2013 09:37     Performed at [facility]   Culture     Final   Value:        BLOOD CULTURE RECEIVED NO GROWTH TO DATE CULTURE WILL BE HELD FOR 5 DAYS BEFORE ISSUING A FINAL NEGATIVE REPORT     Performed at [facility]   Report Status PENDING   Incomplete  CULTURE, BLOOD (ROUTINE X 2)     Status: None   Collection Time    10/08/13  3:06 AM      Result Value Range Status   Specimen Description BLOOD RIGHT FOREARM   Final   Special Requests BOTTLES DRAWN AEROBIC AND ANAEROBIC 10CC   Final   Culture  Setup Time     Final   Value:  10/08/2013 09:37     Performed at [facility]   Culture     Final   Value:        BLOOD CULTURE RECEIVED NO GROWTH TO DATE CULTURE WILL BE HELD FOR 5 DAYS BEFORE ISSUING A FINAL NEGATIVE REPORT     Performed at [facility]   Report Status PENDING   Incomplete  URINE CULTURE     Status: None   Collection Time    10/08/13  5:07 AM      Result Value Range Status   Specimen Description URINE, CATHETERIZED   Final   Special Requests NONE   Final   Culture  Setup Time     Final   Value: 10/08/2013 09:50     Performed at [place]     Final   Value: NO GROWTH     Performed at [facility]   Culture     Final   Value: NO GROWTH     Performed at [facility]   Report Status 10/09/2013 FINAL   Final     Studies:              All Imaging reviewed and is as per above notation   Scheduled Meds: . docusate sodium  100 mg Oral BID  . feeding supplement (RESOURCE BREEZE)  1 Container Oral BID BM  . heparin  5,000 Units Subcutaneous Q8H  .  morphine injection  1-2 mg Intravenous Once  . multivitamin with minerals  1 tablet Oral Daily  . piperacillin-tazobactam (ZOSYN)  IV  3.375 g Intravenous Q8H  . silver sulfADIAZINE   Topical BID   Continuous Infusions:     Assessment/Plan: 1. Acute kidney injury-secondary to potential sepsis vs. Bactrim + poor by mouth intake as well as nausea vomiting-resolved hypervolemic hyponatremia from admission as well. Bicarbonate discontinued 10/19 Creatinine has trended down well from 6.24--4.64--->3.27-->2.26.  Discontinued saline at 75 cc/hr on 10/19  2. Severe sepsis secondary to infected lower extremity venous ulcers-appreciate consult from Dr. Donnetta Hutching.  Nonoperative management recommended -recommended Silvadene dressings bid with Kerlix and Ace wrap and followup with wound center.  Vanc/Zosyn transitioned to Doxy [facility] coverage] and Levaquin on 10/19 3. Mild obstructive sleep apnea-currently  stable. Probably does not need oxygen 4. Dilutional anemia-hemoglobin stable at 8.3.  If her stool noted dark will workup otherwise monitor 5. ? Sarcoidosis-vision has had discussion with Dr. Danton Sewer  pulmonary earlier this year and elected not to have any further workup done. Outpatient pulmonary followup needed with regards to interval CT scan monitoring etc. 6. Hypertension-now to moderately hypotensive. Her amlodipine 5 mg as well as losartan 100 mg  held. Would recommend discontinuation of ARB until seen by primary care physician once again 7. Morbid obesity, Body mass index is 49.79 kg/(m^2). this is potentially life limiting and patient will need counseling with regards to this as well as potential bariatric surgery if she can tolerate the same subsequently.  Code Status: Full Family Communication: No family at bedside Disposition Plan: Inpatient-Will need SNF   Verneita Griffes, MD  [facility] Pager [phone] 10/11/2013, 1:42 PM    LOS: 3 days

## 2013-10-11 NOTE — Progress Notes (Signed)
ANTIBIOTIC CONSULT NOTE - follow up  Pharmacy Consult for Vancomycin and Zosyn  Indication: Cellulitis and stasis ulcer of both legs  No Known Allergies  Patient Measurements: Height: 5\' 7"  (170.2 cm) Weight: 318 lb (144.244 kg) IBW/kg (Calculated) : 61.6 Adjusted Body Weight: 128kg  Vital Signs: Temp: 99.1 F (37.3 C) (10/19 0625) Temp src: Oral (10/19 0625) BP: 115/69 mmHg (10/19 0625) Pulse Rate: 83 (10/19 0625) Intake/Output from previous day: 10/18 0701 - 10/19 0700 In: 1552.5 [I.V.:1152.5; IV Piggyback:400] Out: 850 [Urine:850] Intake/Output from this shift:    Labs:  Recent Labs  10/09/13 0545 10/09/13 0857 10/10/13 0824 10/11/13 0515  WBC  --  17.2* 16.2* 14.2*  HGB  --  8.2* 8.3* 8.3*  PLT  --  376 402* 428*  CREATININE 4.64*  --  3.27* 2.66*   Estimated Creatinine Clearance: 37.4 ml/min (by C-G formula based on Cr of 2.66).  Recent Labs  10/11/13 0515  VANCORANDOM 23.5     Microbiology: Recent Results (from the past 720 hour(s))  CULTURE, BLOOD (ROUTINE X 2)     Status: None   Collection Time    10/08/13  2:37 AM      Result Value Range Status   Specimen Description BLOOD RAC   Final   Special Requests BOTTLES DRAWN AEROBIC AND ANAEROBIC [facility] EACH   Final   Culture  Setup Time     Final   Value: 10/08/2013 09:37     Performed at [facility]   Culture     Final   Value:        BLOOD CULTURE RECEIVED NO GROWTH TO DATE CULTURE WILL BE HELD FOR 5 DAYS BEFORE ISSUING A FINAL NEGATIVE REPORT     Performed at [facility]   Report Status PENDING   Incomplete  CULTURE, BLOOD (ROUTINE X 2)     Status: None   Collection Time    10/08/13  3:06 AM      Result Value Range Status   Specimen Description BLOOD RIGHT FOREARM   Final   Special Requests BOTTLES DRAWN AEROBIC AND ANAEROBIC 10CC   Final   Culture  Setup Time     Final   Value: 10/08/2013 09:37     Performed at [facility]   Culture     Final   Value:        BLOOD  CULTURE RECEIVED NO GROWTH TO DATE CULTURE WILL BE HELD FOR 5 DAYS BEFORE ISSUING A FINAL NEGATIVE REPORT     Performed at [facility]   Report Status PENDING   Incomplete  URINE CULTURE     Status: None   Collection Time    10/08/13  5:07 AM      Result Value Range Status   Specimen Description URINE, CATHETERIZED   Final   Special Requests NONE   Final   Culture  Setup Time     Final   Value: 10/08/2013 09:50     Performed at [place]     Final   Value: NO GROWTH     Performed at [facility]   Culture     Final   Value: NO GROWTH     Performed at [facility]   Report Status 10/09/2013 FINAL   Final    Medical History: Past Medical History  Diagnosis Date  . Hypertension   . Depression   . Asthma   . Ulcer  left posterior calf  . Headache(784.0)   . Hyperlipidemia   . Lung nodules     Medications:  Anti-infectives   Start     Dose/Rate Route Frequency Ordered Stop   10/10/13 1000  vancomycin (VANCOCIN) 2,000 mg in sodium chloride 0.9 % 500 mL IVPB     2,000 mg 250 mL/hr over 120 Minutes Intravenous Every 48 hours 10/08/13 1135     10/09/13 0600  vancomycin (VANCOCIN) 1,500 mg in sodium chloride 0.9 % 500 mL IVPB  Status:  Discontinued     1,500 mg 250 mL/hr over 120 Minutes Intravenous Every 24 hours 10/08/13 0558 10/08/13 0601   10/09/13 0600  vancomycin (VANCOCIN) 1,500 mg in sodium chloride 0.9 % 500 mL IVPB  Status:  Discontinued     1,500 mg 250 mL/hr over 120 Minutes Intravenous Every 48 hours 10/08/13 0601 10/08/13 1135   10/08/13 1200  piperacillin-tazobactam (ZOSYN) IVPB 2.25 g     2.25 g 100 mL/hr over 30 Minutes Intravenous 4 times per day 10/08/13 0558     10/08/13 1200  vancomycin (VANCOCIN) IVPB 1000 mg/200 mL premix     1,000 mg 200 mL/hr over 60 Minutes Intravenous  Once 10/08/13 1133 10/08/13 1335   10/08/13 0230  vancomycin (VANCOCIN) IVPB 1000 mg/200 mL premix     1,000 mg 200 mL/hr over  60 Minutes Intravenous  Once 10/08/13 0215 10/08/13 0541   10/08/13 0230  piperacillin-tazobactam (ZOSYN) IVPB 3.375 g     3.375 g 100 mL/hr over 30 Minutes Intravenous  Once 10/08/13 0215 10/08/13 0706     Assessment: 52yo F with ARF and bilateral cellulitis and stasis ulcers of both legs. Pharmacy was asked to dose Vancomycin and Zosyn.  Day #4 Vanc 2g IV q48h  Day #4 Zosyn 2.25g IV q6h  SCr continues to improve. CrCl 37CG, 28N.  Afebrile, WBCs elevated but improving.  Blood cxs negative to date.  Urine cx negative.  Vanc level drawn ~20hrs after 2g dose = 23.5mg /l.  Goal of Therapy:  Vancomycin trough level 15-20 mcg/ml Zosyn based on renal function   Plan:   Hold Vanc doses for now and recheck a Vanc level in ~24hrs to assess rate of clearance.  Change Zosyn to 3.375g IV Q8H infused over 4hrs.  Follow up renal fxn and culture results.  Romeo Rabon, PharmD, pager [phone]. 10/11/2013,8:12 AM.

## 2013-10-12 ENCOUNTER — Encounter (HOSPITAL_COMMUNITY): Payer: Self-pay | Admitting: *Deleted

## 2013-10-12 LAB — CBC
HCT: 26.9 % — ABNORMAL LOW (ref 36.0–46.0)
Hemoglobin: 8.4 g/dL — ABNORMAL LOW (ref 12.0–15.0)
MCH: 27.3 pg (ref 26.0–34.0)
MCHC: 31.2 g/dL (ref 30.0–36.0)
MCV: 87.3 fL (ref 78.0–100.0)
Platelets: 410 10*3/uL — ABNORMAL HIGH (ref 150–400)
RBC: 3.08 MIL/uL — ABNORMAL LOW (ref 3.87–5.11)
RDW: 16.7 % — ABNORMAL HIGH (ref 11.5–15.5)
WBC: 11.9 10*3/uL — ABNORMAL HIGH (ref 4.0–10.5)

## 2013-10-12 LAB — BASIC METABOLIC PANEL
BUN: 62 mg/dL — ABNORMAL HIGH (ref 6–23)
CO2: 27 mEq/L (ref 19–32)
Calcium: 9.1 mg/dL (ref 8.4–10.5)
Chloride: 103 mEq/L (ref 96–112)
Creatinine, Ser: 1.91 mg/dL — ABNORMAL HIGH (ref 0.50–1.10)
GFR calc Af Amer: 34 mL/min — ABNORMAL LOW (ref >90)
GFR calc non Af Amer: 29 mL/min — ABNORMAL LOW (ref >90)
Glucose, Bld: 116 mg/dL — ABNORMAL HIGH (ref 70–99)
Potassium: 4.1 mEq/L (ref 3.5–5.1)
Sodium: 139 mEq/L (ref 135–145)

## 2013-10-12 NOTE — Progress Notes (Addendum)
Clinical Social Work Department CLINICAL SOCIAL WORK PLACEMENT NOTE 10/12/2013  Patient:  Kari [name], Kari [name]  Account Number:  [id number] [employer] date:  10/08/2013  Clinical Social Worker:  Sindy Messing, LCSW  Date/time:  10/12/2013 09:00 AM  Clinical Social Work is seeking post-discharge placement for this patient at the following level of care:   [place]   (*CSW will update this form in Epic as items are completed)   10/12/2013  Patient/family provided with [place] Department of Clinical Social Work's list of facilities offering this level of care within the geographic area requested by the patient (or if unable, by the patient's family).  10/12/2013  Patient/family informed of their freedom to choose among providers that offer the needed level of care, that participate in Medicare, Medicaid or managed care program needed by the patient, have an available bed and are willing to accept the patient.  10/12/2013  Patient/family informed of [facility]' ownership interest in [facility], as well as of the fact that they are under no obligation to receive care at this facility.  PASARR submitted to EDS on 10/12/2013 PASARR number received from EDS on 10/12/2013  FL2 transmitted to all facilities in geographic area requested by pt/family on  10/12/2013 FL2 transmitted to all facilities within larger geographic area on   Patient informed that his/her managed care company has contracts with or will negotiate with  certain facilities, including the following:     Patient/family informed of bed offers received:  10/13/13 Patient chooses bed at  Physician recommends and patient chooses bed at    Patient to be transferred to  on   Patient to be transferred to facility by   The following physician request were entered in Epic:   Additional Comments:  10/14/13-Patient has decided to return home with family and HH. CSW is signing off.

## 2013-10-12 NOTE — Progress Notes (Signed)
Pt tearful, refusing nursing care, states her daughter has a problem at home with CPS. Emotional support given, pastoral care paged for further support. Will continue to monitor.

## 2013-10-12 NOTE — Progress Notes (Signed)
Clinical Social Work  Per Therapist, sports, patient now interested in SNF placement. CSW met with patient at bedside who reports that she has family that works at [facility] and she wants to go there at DC. CSW explained SNF process and provided list. Patient reports she wants to stay in [facility] to be close to family. CSW explained that SNF would need to accept Medicaid as payment and that patient would be required to stay 30 days for Medicaid to pay. Patient agreeable to plan and agreeable to [facility] but prefers [facility].  FL2 faxed out and CSW will follow up with bed offers.  [place], [place] [phone]

## 2013-10-12 NOTE — Progress Notes (Signed)
10/12/13 1600  Clinical Encounter Type  Visited With Patient  Visit Type (pt tearful; RN req support)  Referral From Nurse  Spiritual Encounters  Spiritual Needs Emotional  Stress Factors  Patient Stress Factors Loss of control (worried about a problem of her daughter's)   Ms Schwegel was tearful just now, worrying about a problem of her daughter's.  Provided pastoral listening and emotional support until a friend returned her phone call to provide further support.  Per pt, her brother and sister are on the way to be with her, too.  Please page if further support needed:  [phone].  Thank you.  [place], [place]

## 2013-10-12 NOTE — Progress Notes (Signed)
ELLYANNA [name] [id number] DOB: March 10, 1961 DOA: 10/08/2013 PCP: Kari Font, MD  Brief narrative: 52 year old ?, known history of chronic venous stasis ulcers followed by Dr. Donnetta [name] of vascular surgery, mild obstructive sleep apnea, noncardiac chest pain,? Sarcoid admitted 10/08/2013 with worsening of left lower extremity purulent discharge, acute kidney injury, creatinine on admission 6.2, baseline 0.86, WBC 25.8 on admission hemoglobin 10.5.  Patient was seen by Dr. Donnetta [name] and non-operative management and [facility]und Care follow up was recommended. Her transitioned from IV to oral on 10/90  Past medical history-As per Problem list Chart reviewed as below-Consultants:  Vascular surgery  Procedures: Renal US   Antibiotics:  Zosyn 10/15-10/19  Vancomycin 10/15-10/19  Levaquin 750 10/19  Doxycycline 100 10/19   Subjective  Doing okay. Doesn't like dressing changes Denies n/v/cp/sob Doesn't like laying down all the time   Objective    Interim History: None  Telemetry: None telemetry   Objective: Filed Vitals:   10/11/13 0625 10/11/13 2151 10/12/13 0627 10/12/13 1607  BP: 115/69 104/61 131/71 116/70  Pulse: 83 86 90 95  Temp: 99.1 F (37.3 C) 99.1 F (37.3 C) 99.1 F (37.3 C) 99.1 F (37.3 C)  TempSrc: Oral Oral Oral Oral  Resp: 18 18 18 20   Height:      Weight:      SpO2: 94% 90% 94% 91%    Intake/Output Summary (Last 24 hours) at 10/12/13 1745 Last data filed at 10/12/13 0926  Gross per 24 hour  Intake    360 ml  Output   1050 ml  Net   -690 ml    Exam:  General: Morbid obesity, Body mass index is 49.79 kg/(m^2)., Mallampati 3 Cardiovascular: S1-S2 no murmur rub or gallop Respiratory: Clinically clear heart exam limited habitus  Abdomen: Obese nondistended no tenderness  Skinlower extremities not examined Neuro range of motion intact  Data Reviewed: Basic Metabolic Panel:  Recent Labs Lab 10/08/13 0119 10/09/13 0545 10/10/13 0824  10/11/13 0515 10/12/13 0500  NA 127* 133* 132* 137 139  K 5.3* 4.9 4.2 4.2 4.1  CL 93* 100 96 101 103  CO2 15* 19 23 25 27   GLUCOSE 125* 105* 117* 115* 116*  BUN 118* 120* 106* 90* 62*  CREATININE 6.24* 4.64* 3.27* 2.66* 1.91*  CALCIUM 9.5 8.6 8.4 8.8 9.1  PHOS  --   --   --  5.4*  --    Liver Function Tests:  Recent Labs Lab 10/08/13 0119 10/10/13 0824 10/11/13 0515  AST 9 56*  --   ALT 12 19  --   ALKPHOS 115 85  --   BILITOT 0.8 1.7*  --   PROT 9.5* 7.5  --   ALBUMIN 2.2* 1.6* 1.6*   No results found for this basename: LIPASE, AMYLASE,  in the last 168 hours No results found for this basename: AMMONIA,  in the last 168 hours CBC:  Recent Labs Lab 10/08/13 0119 10/09/13 0857 10/10/13 0824 10/11/13 0515 10/12/13 0500  WBC 25.8* 17.2* 16.2* 14.2* 11.9*  NEUTROABS 23.8* 14.7*  --   --   --   HGB 10.5* 8.2* 8.3* 8.3* 8.4*  HCT 30.3* 24.3* 24.7* 25.2* 26.9*  MCV 83.7 82.9 83.4 84.8 87.3  PLT 452* 376 402* 428* 410*   Cardiac Enzymes: No results found for this basename: CKTOTAL, CKMB, CKMBINDEX, TROPONINI,  in the last 168 hours BNP: No components found with this basename: POCBNP,  CBG: No results found for this basename: GLUCAP,  in the last 168  hours  Recent Results (from the past 240 hour(s))  CULTURE, BLOOD (ROUTINE X 2)     Status: None   Collection Time    10/08/13  2:37 AM      Result Value Range Status   Specimen Description BLOOD RAC   Final   Special Requests BOTTLES DRAWN AEROBIC AND ANAEROBIC [facility] EACH   Final   Culture  Setup Time     Final   Value: 10/08/2013 09:37     Performed at [facility]   Culture     Final   Value:        BLOOD CULTURE RECEIVED NO GROWTH TO DATE CULTURE WILL BE HELD FOR 5 DAYS BEFORE ISSUING A FINAL NEGATIVE REPORT     Performed at [facility]   Report Status PENDING   Incomplete  CULTURE, BLOOD (ROUTINE X 2)     Status: None   Collection Time    10/08/13  3:06 AM      Result Value Range Status    Specimen Description BLOOD RIGHT FOREARM   Final   Special Requests BOTTLES DRAWN AEROBIC AND ANAEROBIC 10CC   Final   Culture  Setup Time     Final   Value: 10/08/2013 09:37     Performed at [facility]   Culture     Final   Value:        BLOOD CULTURE RECEIVED NO GROWTH TO DATE CULTURE WILL BE HELD FOR 5 DAYS BEFORE ISSUING A FINAL NEGATIVE REPORT     Performed at [facility]   Report Status PENDING   Incomplete  URINE CULTURE     Status: None   Collection Time    10/08/13  5:07 AM      Result Value Range Status   Specimen Description URINE, CATHETERIZED   Final   Special Requests NONE   Final   Culture  Setup Time     Final   Value: 10/08/2013 09:50     Performed at [place]     Final   Value: NO GROWTH     Performed at [facility]   Culture     Final   Value: NO GROWTH     Performed at [facility]   Report Status 10/09/2013 FINAL   Final     Studies:              All Imaging reviewed and is as per above notation   Scheduled Meds: . docusate sodium  100 mg Oral BID  . doxycycline  100 mg Oral Q12H  . feeding supplement (RESOURCE BREEZE)  1 Container Oral BID BM  . heparin  5,000 Units Subcutaneous Q8H  . levofloxacin  750 mg Oral Q48H  . multivitamin with minerals  1 tablet Oral Daily  . silver sulfADIAZINE   Topical BID   Continuous Infusions:     Assessment/Plan:  1. Acute kidney injury-secondary to potential sepsis vs. Bactrim + poor by mouth intake as well as nausea vomiting-resolved hypervolemic hyponatremia from admission as well. Bicarbonate discontinued 10/19 Creatinine has trended down well from 6.24--4.64--->3.27-->2.26--->1.91.  Discontinued saline at 75 cc/hr on 10/19. 2. Severe sepsis secondary to infected lower extremity venous ulcers-appreciate consult from Dr. Donnetta [name].  Nonoperative management recommended -recommended Silvadene dressings bid with Kerlix and Ace wrap and followup with wound  center.  Vanc/Zosyn transitioned to Doxy [facility] coverage] and Levaquin on 10/19 3. Mild obstructive sleep apnea-currently  stable. Probably does not need oxygen. 4. Dilutional anemia-hemoglobin stable at 8.3.  If her stool noted dark will workup otherwise monitor. 30. ? Sarcoidosis-vision has had discussion with Dr. Danton Sewer pulmonary earlier this year and elected not to have any further workup done. Outpatient pulmonary followup needed with regards to interval CT scan monitoring etc. 6. Hypertension-now to moderately hypotensive. Her amlodipine 5 mg as well as losartan 100 mg held.  Would recommend discontinuation of ARB until seen by primary care physician once again. 7. Morbid obesity, Body mass index is 49.79 kg/(m^2). this is potentially life limiting and patient will need counseling with regards to this as well as potential bariatric surgery if she can tolerate the same subsequently.  Code Status: Full Family Communication: No family at bedside Disposition Plan: Inpatient-Will need SNF   Verneita Griffes, MD  [facility] Pager [phone] 10/12/2013, 5:45 PM    LOS: 4 days

## 2013-10-12 NOTE — Progress Notes (Signed)
Clinical Social Work  CSW met with patient at bedside and reports [name] unable to accept and that [facility] [facility] is considering. Patient wants to search in [facility] as well. CSW asked if patient was agreeable for CSW to discuss DC plans with family and patient is agreeable. CSW expanded search to [facility]. CSW called and left a message for brother Fritz Pickerel) with CSW contact information.  CSW submitted DMA form via [place] for Medicaid approval. Confirmation # [id number] W.  CSW will continue to follow.  [place], [place] [phone]

## 2013-10-13 DIAGNOSIS — A419 Sepsis, unspecified organism: Secondary | ICD-10-CM | POA: Insufficient documentation

## 2013-10-13 MED ORDER — HYDROCODONE-ACETAMINOPHEN 5-325 MG PO TABS: 1.0000 | ORAL_TABLET | ORAL | Status: DC | PRN

## 2013-10-13 MED ORDER — BOOST / RESOURCE BREEZE PO LIQD: 1.0000 | Freq: Two times a day (BID) | ORAL | Status: DC

## 2013-10-13 MED ORDER — SILVER SULFADIAZINE 1 % EX CREA: TOPICAL_CREAM | Freq: Two times a day (BID) | CUTANEOUS | Status: DC

## 2013-10-13 MED ORDER — LEVOFLOXACIN 750 MG PO TABS
750.0000 mg | ORAL_TABLET | Freq: Every day | ORAL | Status: DC
  Administered 2013-10-13 – 2013-10-14 (×2): 750 mg via ORAL
  Filled 2013-10-13 (×2): qty 1

## 2013-10-13 MED ORDER — MORPHINE SULFATE 4 MG/ML IJ SOLN
4.0000 mg | Freq: Once | INTRAMUSCULAR | Status: AC
  Administered 2013-10-13: 4 mg via INTRAVENOUS
  Filled 2013-10-13: qty 1

## 2013-10-13 MED ORDER — DOXYCYCLINE HYCLATE 100 MG PO TABS: 100.0000 mg | ORAL_TABLET | Freq: Two times a day (BID) | ORAL | Status: DC

## 2013-10-13 MED ORDER — LEVOFLOXACIN 750 MG PO TABS: 750.0000 mg | ORAL_TABLET | Freq: Every day | ORAL | Status: DC

## 2013-10-13 NOTE — Discharge Summary (Addendum)
Physician Discharge Summary  Kari [name] [id number] DOB: 1961-11-13 DOA: 10/08/2013  PCP: Maggie Font, MD  Admit date: 10/08/2013 Discharge date: 10/13/2013  Time spent: 35 minutes  Recommendations for Outpatient Follow-up:  1. Needs toprobably followup with outpatient pulmonology for interval review of agents sarcoidosis 2.  already has wound care followup setup as below 3. Complete antibiotics 10/29 for cellulitis, will need daily dressings with Silvadene dressings twice a day, please medicate prior to giving the dressings about 30 minutes before with Percocet if needed 4. Patient has been recommended to elevate legs above heart at all times except when needing to ambulate., This may be challenging as patient will need to rehabilitate with skilled therapy at facility 5. When able to, recommend lymphedema treatment under therapy as well as lymphedema wrapping once wounds have healed  Discharge Diagnoses:  Principal Problem:   Sepsis, not severe Active Problems:   Venous stasis ulcers   Hypertension   Depression   Pulmonary nodules   ARF (acute renal failure)   Cellulitis and abscess   Obesity   Volume depletion   Discharge Condition: fair  Diet recommendation: heart healthy low-salt  Filed Weights   10/08/13 0033 10/08/13 0417  Weight: 144.244 kg (318 lb) 144.244 kg (318 lb)    History of present illness:  52 year old ?, known history of chronic venous stasis ulcers followed by Dr. Donnetta Hutching of vascular surgery, mild obstructive sleep apnea, noncardiac chest pain,? Sarcoid admitted 10/08/2013 with worsening of left lower extremity purulent discharge, acute kidney injury, creatinine on admission 6.2, baseline 0.86, WBC 25.8 on admission hemoglobin 10.5.  Patient was seen by Dr. Donnetta Hutching and non-operative management and [facility] Care follow up was recommended.  antibiotics transitioned from IV to oral on 10/90   Hospital Course:   1. Acute kidney injury-secondary to  potential sepsis vs. Bactrim + poor by mouth intake as well as nausea vomiting-resolved hypervolemic hyponatremia from admission as well. Bicarbonate discontinued 10/19 Creatinine has trended down well from 6.24--4.64--->3.27-->2.26--->1.91. Discontinued saline at 75 cc/hr on 10/19. 2. Sepsis[severe] secondary to infected lower extremity venous ulcers-appreciate consult from Dr. Donnetta Hutching. Nonoperative management recommended -recommended Silvadene dressings bid with Kerlix and Ace wrap and followup with wound center. Vanc/Zosyn transitioned to Doxy [facility] coverage] and Levaquin on 10/19 to complete 10 more days of therapy 10/29 3. Mild obstructive sleep apnea-currently stable. Probably does not need oxygen. 4. Dilutional anemia-hemoglobin stable at 8.3. If her stool noted dark will workup otherwise monitor-hemoglobin has remained stable 5. ? Sarcoidosis-vision has had discussion with Dr. Danton Sewer pulmonary earlier this year and elected not to have any further workup done. Outpatient pulmonary followup needed with regards to interval CT scan monitoring etc. 6. Hypertension-now to moderately hypotensive. Her amlodipine 5 mg as well as losartan 100 mg held. Would recommend discontinuation of ARB until seen by primary care physician once again. 7. Morbid obesity, Body mass index is 49.79 kg/(m^2). this is potentially life limiting and patient will need counseling with regards to this as well as potential bariatric surgery if she can tolerate the same subsequently.   Past medical history-As per Problem list  Chart reviewed as below-Consultants:  Vascular surgery Procedures:  Renal US  Antibiotics:  Zosyn 10/15-10/19  Vancomycin 10/15-10/19  Levaquin 750 10/19-10/29 Doxycycline 100 10/19-10/29  Discharge Exam: Filed Vitals:   10/13/13 0454  BP: 117/68  Pulse: 84  Temp: 99 F (37.2 C)  Resp: 18   Alert well oriented no apparent distress Tolerating diet 1 changes are particularly painful and  patient cries out when she has have these done   General: EOMI, NCAT,Morbid obesity, Body mass index is 49.79 kg/(m^2). Cardiovascular: S1-S2 no murmur rub or gallop non-telemetry Respiratory: clinically clear  Discharge Instructions  Discharge Orders   Future Orders Complete By Expires   Change dressing (specify)  As directed    Comments:     Dressing change: 2 times per day using silver dressings. Pre-medicate with pain meds prior   Diet - low sodium heart healthy  As directed    Increase activity slowly  As directed        Medication List    STOP taking these medications       losartan 100 MG tablet  Commonly known as:  COZAAR      TAKE these medications       albuterol 108 (90 BASE) MCG/ACT inhaler  Commonly known as:  PROVENTIL HFA;VENTOLIN HFA  Inhale 2 puffs into the lungs every 6 (six) hours as needed for shortness of breath.     amLODipine 5 MG tablet  Commonly known as:  NORVASC  Take 5 mg by mouth daily.     doxycycline 100 MG tablet  Commonly known as:  VIBRA-TABS  Take 1 tablet (100 mg total) by mouth every 12 (twelve) hours.     feeding supplement (RESOURCE BREEZE) Liqd  Take 1 Container by mouth 2 (two) times daily between meals.     HYDROcodone-acetaminophen 5-325 MG per tablet  Commonly known as:  NORCO/VICODIN  Take 1-2 tablets by mouth every 4 (four) hours as needed.     levofloxacin 750 MG tablet  Commonly known as:  LEVAQUIN  Take 1 tablet (750 mg total) by mouth daily.     silver sulfADIAZINE 1 % cream  Commonly known as:  SILVADENE  Apply topically 2 (two) times daily.       No Known Allergies     Follow-up Information   Follow up with Osvaldo Angst, MD On 10/23/2013. (13:00--arrive at 12:40 for appt)    Specialty:  General Surgery   Contact information:   [address] [place] [address] [phone]        The results of significant diagnostics from this hospitalization (including imaging, microbiology,  ancillary and laboratory) are listed below for reference.    Significant Diagnostic Studies: Ct Chest Wo Contrast  10/08/2013   CLINICAL DATA:  Followup lung nodules.  EXAM: CT CHEST WITHOUT CONTRAST  TECHNIQUE: Multidetector CT imaging of the chest was performed following the standard protocol without IV contrast.  COMPARISON:  09/02/2012  FINDINGS: THORACIC INLET/BODY WALL:  No acute abnormality.  MEDIASTINUM:  Normal heart size. No pericardial effusion. No acute vascular abnormality. Adenopathy has decreased or resolved.  LUNG WINDOWS:  Respiratory motion significantly decreases sensitivity for detecting pulmonary nodules. No enlarging or new nodules are seen. An anterior segment right upper lobe, lateral segment right middle lobe, and anterior right lower lobe pulmonary nodules show no evidence of interval growth, all measuring approximately 7 mm. A previously seen nodule in the central left lower lobe it is obscured by motion. The a faint 4 mm nodule in the left lower lobe along the mid major fissure which is unchanged (image 20).  UPPER ABDOMEN:  No acute findings.  OSSEOUS:  No acute fracture.  No suspicious lytic or blastic lesions.  IMPRESSION: 1. Evaluation the lung parenchyma limited by patient motion. 2. No interval enlargement of previously documented pulmonary nodules. No new pulmonary nodule seen. 3.  Decreased mediastinal and hilar adenopathy.   Electronically Signed   By: Jorje Guild M.D.   On: 10/08/2013 04:26   US Renal  10/08/2013   CLINICAL DATA:  Acute renal failure.  EXAM: RENAL/URINARY TRACT ULTRASOUND COMPLETE  COMPARISON:  None.  FINDINGS: Right Kidney  Length: 13.5 cm. Echogenicity within normal limits. No mass or hydronephrosis visualized.  Left Kidney  Length: 14.6 cm. Echogenicity within normal limits. No mass or hydronephrosis visualized.  Bladder  The patient has a Foley catheter in place. There is a large fluid-filled structure in the midline of the pelvis with extensive  debris within it. This could represent a distended urinary bladder but the possibility of a cystic mass in the pelvis should be considered particularly since we are unable to identify the Foley catheter.  IMPRESSION: 1. Normal appearing kidneys. 2. Large cystic structure in the pelvis which could represent a distended urinary bladder or a cystic mass. However, we do not see a Foley catheter in this structure.   Electronically Signed   By: Rozetta Nunnery M.D.   On: 10/08/2013 16:30    Microbiology: Recent Results (from the past 240 hour(s))  CULTURE, BLOOD (ROUTINE X 2)     Status: None   Collection Time    10/08/13  2:37 AM      Result Value Range Status   Specimen Description BLOOD RAC   Final   Special Requests BOTTLES DRAWN AEROBIC AND ANAEROBIC [facility] EACH   Final   Culture  Setup Time     Final   Value: 10/08/2013 09:37     Performed at [facility]   Culture     Final   Value:        BLOOD CULTURE RECEIVED NO GROWTH TO DATE CULTURE WILL BE HELD FOR 5 DAYS BEFORE ISSUING A FINAL NEGATIVE REPORT     Performed at [facility]   Report Status PENDING   Incomplete  CULTURE, BLOOD (ROUTINE X 2)     Status: None   Collection Time    10/08/13  3:06 AM      Result Value Range Status   Specimen Description BLOOD RIGHT FOREARM   Final   Special Requests BOTTLES DRAWN AEROBIC AND ANAEROBIC 10CC   Final   Culture  Setup Time     Final   Value: 10/08/2013 09:37     Performed at [facility]   Culture     Final   Value:        BLOOD CULTURE RECEIVED NO GROWTH TO DATE CULTURE WILL BE HELD FOR 5 DAYS BEFORE ISSUING A FINAL NEGATIVE REPORT     Performed at [facility]   Report Status PENDING   Incomplete  URINE CULTURE     Status: None   Collection Time    10/08/13  5:07 AM      Result Value Range Status   Specimen Description URINE, CATHETERIZED   Final   Special Requests NONE   Final   Culture  Setup Time     Final   Value: 10/08/2013 09:50     Performed at  [place]     Final   Value: NO GROWTH     Performed at [facility]   Culture     Final   Value: NO GROWTH     Performed at [facility]   Report Status 10/09/2013 FINAL   Final     Labs:  Basic Metabolic Panel:  Recent Labs Lab 10/08/13 0119 10/09/13 0545 10/10/13 0824 10/11/13 0515 10/12/13 0500  NA 127* 133* 132* 137 139  K 5.3* 4.9 4.2 4.2 4.1  CL 93* 100 96 101 103  CO2 15* 19 23 25 27   GLUCOSE 125* 105* 117* 115* 116*  BUN 118* 120* 106* 90* 62*  CREATININE 6.24* 4.64* 3.27* 2.66* 1.91*  CALCIUM 9.5 8.6 8.4 8.8 9.1  PHOS  --   --   --  5.4*  --    Liver Function Tests:  Recent Labs Lab 10/08/13 0119 10/10/13 0824 10/11/13 0515  AST 9 56*  --   ALT 12 19  --   ALKPHOS 115 85  --   BILITOT 0.8 1.7*  --   PROT 9.5* 7.5  --   ALBUMIN 2.2* 1.6* 1.6*   No results found for this basename: LIPASE, AMYLASE,  in the last 168 hours No results found for this basename: AMMONIA,  in the last 168 hours CBC:  Recent Labs Lab 10/08/13 0119 10/09/13 0857 10/10/13 0824 10/11/13 0515 10/12/13 0500  WBC 25.8* 17.2* 16.2* 14.2* 11.9*  NEUTROABS 23.8* 14.7*  --   --   --   HGB 10.5* 8.2* 8.3* 8.3* 8.4*  HCT 30.3* 24.3* 24.7* 25.2* 26.9*  MCV 83.7 82.9 83.4 84.8 87.3  PLT 452* 376 402* 428* 410*   Cardiac Enzymes: No results found for this basename: CKTOTAL, CKMB, CKMBINDEX, TROPONINI,  in the last 168 hours BNP: BNP (last 3 results) No results found for this basename: PROBNP,  in the last 8760 hours CBG: No results found for this basename: GLUCAP,  in the last 168 hours     Signed:  Nita Sells  [facility] 10/13/2013, 2:20 PM

## 2013-10-13 NOTE — Progress Notes (Signed)
Called np on call and informed that pt has great deal of pain during dressing changes. Asked NP for pain medication for dressing changes. Awaiting call back.

## 2013-10-13 NOTE — Progress Notes (Signed)
Clinical Social Work  CSW received a call from [facility]) who reports they can accept patient but due to DC schedule they cannot patient until the morning. CSW met with patient, sister, and aunt at bedside to explain offers. Patient reports she would prefer to be closer to home but understanding of barriers with insurance. Patient called brother Fritz Pickerel) when CSW was in the room as well and CSW explained DC plans.  Patient reports she will discuss plans with family tonight. CSW has sent DC summary to SNF to prepare for DC in the morning. CSW updated RN and text paged MD information regarding DC plans.  [place], [place] [phone]

## 2013-10-13 NOTE — Progress Notes (Addendum)
Clinical Social Work  Per MD, patient is medically stable to DC.   CSW met with patient to explain no firm bed offers at this time. Patient reports she wants to stay close to home and that family has spoken to [facility] and [facility]. CSW called these facilities who confirmed they are not accepting Medicaid patients at this time. CSW explained if patient was interested in placement then search would have to be expanded to other counties. Patient reports understanding and agreeable to placement.  CSW spent time talking with patient and friend about applying for LT Medicaid and disability. Friend reports she will assist patient in applying for these services as well.  CSW reviewed bed offers and continues to work with facilities that have reported they would consider placement.CSW has called and spoken to [facility], [facility] [place], and [facility]. All facilties report they are continuing to review with the exception of [place] who reports they cannot accept patient.  Patient was discussed in [place] and Market researcher suggested [place], [place] [facility] [place], [place], or [place]. [place] and [facility] have declined patient. CSW has spoken with admission's coordinators at [place], [place], and [place] who report they are continuing to review information and will call CSW once they have made a decision.  CSW will continue to follow.  [place], [place] [phone]

## 2013-10-14 LAB — CULTURE, BLOOD (ROUTINE X 2)
Culture: NO GROWTH
Culture: NO GROWTH

## 2013-10-14 NOTE — Progress Notes (Signed)
[facility]  [facility] is providing the following services: RW, Commode and Wheelchair  If patient discharges after hours, please call [phone].   Linward Headland 10/14/2013, 10:31 AM

## 2013-10-14 NOTE — Progress Notes (Signed)
Clinical Social Work  CSW spoke with patient at bedside who reports after discussing plans with family would like to return home. Patient reports that [name] is too far for family to visit and that she will go and stay with aunt who will provide care. CM to arrange [facility]. CSW informed SNF of patient's decision and is signing off.  [place], [place] [phone]

## 2013-10-14 NOTE — Progress Notes (Signed)
Met with pt this AM to discuss d/c planning. She wants to go home with [facility] services and family support. [facility] will provide the services and the DME. No further needs at this time.  Allene Dillon RN BSN   [phone]

## 2013-10-17 DIAGNOSIS — R6520 Severe sepsis without septic shock: Secondary | ICD-10-CM

## 2013-10-17 DIAGNOSIS — A419 Sepsis, unspecified organism: Secondary | ICD-10-CM

## 2013-10-19 ENCOUNTER — Encounter (HOSPITAL_BASED_OUTPATIENT_CLINIC_OR_DEPARTMENT_OTHER): Payer: Medicaid Other

## 2013-10-28 ENCOUNTER — Institutional Professional Consult (permissible substitution): Payer: Medicaid Other | Admitting: Pulmonary Disease

## 2013-11-03 ENCOUNTER — Encounter: Payer: Self-pay | Admitting: Pulmonary Disease

## 2013-11-03 ENCOUNTER — Ambulatory Visit (INDEPENDENT_AMBULATORY_CARE_PROVIDER_SITE_OTHER): Payer: Medicaid Other | Admitting: Pulmonary Disease

## 2013-11-03 ENCOUNTER — Other Ambulatory Visit: Payer: Self-pay | Admitting: Pulmonary Disease

## 2013-11-03 VITALS — BP 128/78 | HR 82 | Temp 98.4°F | Ht 67.0 in | Wt 289.0 lb

## 2013-11-03 DIAGNOSIS — R918 Other nonspecific abnormal finding of lung field: Secondary | ICD-10-CM

## 2013-11-03 DIAGNOSIS — R590 Localized enlarged lymph nodes: Secondary | ICD-10-CM

## 2013-11-03 DIAGNOSIS — R599 Enlarged lymph nodes, unspecified: Secondary | ICD-10-CM

## 2013-11-03 NOTE — Assessment & Plan Note (Signed)
The patient's pulmonary nodules are completely stable from her CT scan last year.  She will need one more scan in a year, and is unchanged, and no further followup is needed.  More than likely this is related to sarcoid.

## 2013-11-03 NOTE — Progress Notes (Signed)
  Subjective:    Patient ID: Kari [name], female    DOB: 1961-11-20, 52 y.o.   MRN: [id number]  HPI Patient comes in today for followup of her abnormal CT chest.  She was last seen one year ago where her CT showed tiny pulmonary nodules, and mild to moderate mediastinal lymphadenopathy.  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  She was recently in the hospital were a followup scan showed no change in her nodules, and an actual decrease in her mediastinal lymphadenopathy.  She has no significant pulmonary symptoms or change in her breathing from last year.  She denies having any significant rashes or skin changes.   Review of Systems  Constitutional: Negative for fever and unexpected weight change.  HENT: Negative for congestion, dental problem, ear pain, nosebleeds, postnasal drip, rhinorrhea, sinus pressure, sneezing, sore throat and trouble swallowing.   Eyes: Negative for redness and itching.  Respiratory: Negative for cough, chest tightness, shortness of breath and wheezing.   Cardiovascular: Negative for palpitations and leg swelling.  Gastrointestinal: Negative for nausea and vomiting.  Genitourinary: Negative for dysuria.  Musculoskeletal: Negative for joint swelling.  Skin: Negative for rash.  Neurological: Negative for headaches.  Hematological: Does not bruise/bleed easily.  Psychiatric/Behavioral: Negative for dysphoric mood. The patient is not nervous/anxious.        Objective:   Physical Exam Obese female in no acute distress Nose without purulence or discharge noted Neck without lymphadenopathy or thyromegaly Chest with clear breath sounds, no wheezing Cardiac exam with irregular rhythm but controlled ventricular response Lower extremities with severe edema bilaterally and compression wraps in place Alert and oriented, moves all 4 extremities.       Assessment & Plan:

## 2013-11-03 NOTE — Assessment & Plan Note (Signed)
The patient's lymphadenopathy has clearly decreased from her scan last year.  I suspect she does have sarcoidosis, but she is really asymptomatic from a pulmonary standpoint, and has not had any significant skin changes.  I see no reason at this point to aggressively pursue a biopsy, and she certainly does not need treatment for possible sarcoid.  She is to call if her pulmonary status changes.

## 2013-11-03 NOTE — Patient Instructions (Signed)
It is very likely that you have sarcoidosis, and your scan of your chest is completely stable.  Would not do anything different as long as you are not having a lot of breathing issues. You will need one more scan of chest in one year.  I will put a reminder in our computer. Please let your eye doctor know that you have sarcoid, so they can be on the look out for eye issues.

## 2013-11-16 ENCOUNTER — Encounter: Payer: Self-pay | Admitting: Vascular Surgery

## 2013-11-17 ENCOUNTER — Ambulatory Visit (INDEPENDENT_AMBULATORY_CARE_PROVIDER_SITE_OTHER): Payer: Medicaid Other | Admitting: Vascular Surgery

## 2013-11-17 ENCOUNTER — Encounter: Payer: Self-pay | Admitting: Vascular Surgery

## 2013-11-17 VITALS — BP 146/94 | HR 92 | Resp 18 | Ht 67.0 in | Wt 293.3 lb

## 2013-11-17 DIAGNOSIS — I83019 Varicose veins of right lower extremity with ulcer of unspecified site: Secondary | ICD-10-CM

## 2013-11-17 DIAGNOSIS — I83029 Varicose veins of left lower extremity with ulcer of unspecified site: Secondary | ICD-10-CM

## 2013-11-17 DIAGNOSIS — I83009 Varicose veins of unspecified lower extremity with ulcer of unspecified site: Secondary | ICD-10-CM

## 2013-11-17 NOTE — Progress Notes (Signed)
The patient presents today for a followup of severe bilateral venous hypertension worse on the right than on the left. She has a long history of venous stasis ulceration and chronic difficulty with massive bilateral lower surety swelling. Venous duplex in the past well documented severe deep venous reflux with no correctable venous hypertension. She was recently seen while hospitalized for infection related to these.  Past Medical History  Diagnosis Date  . Hypertension   . Depression   . Asthma   . Ulcer     left posterior calf  . Headache(784.0)   . Hyperlipidemia   . Lung nodules   . Sarcoidosis     History  Substance Use Topics  . Smoking status: Never Smoker   . Smokeless tobacco: Never Used  . Alcohol Use: No    Family History  Problem Relation Age of Onset  . Heart failure Mother   . Heart disease Father     No Known Allergies  Current outpatient prescriptions:albuterol (PROVENTIL HFA;VENTOLIN HFA) 108 (90 BASE) MCG/ACT inhaler, Inhale 2 puffs into the lungs every 6 (six) hours as needed for shortness of breath., Disp: , Rfl: ;  amLODipine (NORVASC) 5 MG tablet, Take 5 mg by mouth daily., Disp: , Rfl: ;  feeding supplement, RESOURCE BREEZE, (RESOURCE BREEZE) LIQD, Take 1 Container by mouth 2 (two) times daily between meals., Disp: , Rfl: 0 ibuprofen (ADVIL,MOTRIN) 800 MG tablet, Take 800 mg by mouth every 8 (eight) hours as needed., Disp: , Rfl: ;  Wound Dressings (SILVASORB) GEL, Apply topically once a week., Disp: , Rfl: ;  silver sulfADIAZINE (SILVADENE) 1 % cream, Apply topically 2 (two) times daily., Disp: 50 g, Rfl: 0  BP 146/94  Pulse 92  Resp 18  Ht 5\' 7"  (1.702 m)  Wt 293 lb 4.8 oz (133.04 kg)  BMI 45.93 kg/m2  LMP 11/21/2011  Body mass index is 45.93 kg/(m^2).       On physical exam she does have complete healing of ulcerations of her left leg was some chronic changes of venous hypertension. On the right she has marked improvement. There is a large  superficial ulcer over the pretibial area anteriorly on the right and also several small ulcerations over the lateral aspect of her calf. There is no surrounding erythema or suggestion of infection.  Impression and plan continued improvement with a diligent home health care nursing. She is currently having a 4 layer wrap and this will continue on weekly basis. I again stressed to critical importance of elevation and she understands this. She will continue local wound care and see [place] again on an as-needed basis

## 2013-11-18 ENCOUNTER — Encounter: Payer: Self-pay | Admitting: Family Medicine

## 2013-12-14 DIAGNOSIS — Z0279 Encounter for issue of other medical certificate: Secondary | ICD-10-CM

## 2013-12-22 DIAGNOSIS — Z0279 Encounter for issue of other medical certificate: Secondary | ICD-10-CM

## 2014-03-12 ENCOUNTER — Encounter (HOSPITAL_COMMUNITY): Payer: Self-pay | Admitting: Emergency Medicine

## 2014-03-12 ENCOUNTER — Emergency Department (HOSPITAL_COMMUNITY): Payer: Medicaid Other

## 2014-03-12 ENCOUNTER — Inpatient Hospital Stay (HOSPITAL_COMMUNITY)
Admission: EM | Admit: 2014-03-12 | Discharge: 2014-03-15 | DRG: 292 | Disposition: A | Discharge status: Home / Self Care | Payer: Medicaid Other | Attending: Family Medicine | Admitting: Family Medicine

## 2014-03-12 DIAGNOSIS — J45909 Unspecified asthma, uncomplicated: Secondary | ICD-10-CM | POA: Diagnosis present

## 2014-03-12 DIAGNOSIS — I872 Venous insufficiency (chronic) (peripheral): Secondary | ICD-10-CM | POA: Diagnosis present

## 2014-03-12 DIAGNOSIS — I5031 Acute diastolic (congestive) heart failure: Principal | ICD-10-CM | POA: Diagnosis present

## 2014-03-12 DIAGNOSIS — E785 Hyperlipidemia, unspecified: Secondary | ICD-10-CM | POA: Diagnosis present

## 2014-03-12 DIAGNOSIS — I509 Heart failure, unspecified: Secondary | ICD-10-CM

## 2014-03-12 DIAGNOSIS — Z6841 Body Mass Index (BMI) 40.0 and over, adult: Secondary | ICD-10-CM

## 2014-03-12 DIAGNOSIS — Z8249 Family history of ischemic heart disease and other diseases of the circulatory system: Secondary | ICD-10-CM

## 2014-03-12 DIAGNOSIS — E669 Obesity, unspecified: Secondary | ICD-10-CM

## 2014-03-12 DIAGNOSIS — I10 Essential (primary) hypertension: Secondary | ICD-10-CM | POA: Diagnosis present

## 2014-03-12 DIAGNOSIS — D869 Sarcoidosis, unspecified: Secondary | ICD-10-CM | POA: Diagnosis present

## 2014-03-12 HISTORY — DX: Heart failure, unspecified: I50.9

## 2014-03-12 LAB — CBC
HCT: 36.4 % (ref 36.0–46.0)
Hemoglobin: 11.5 g/dL — ABNORMAL LOW (ref 12.0–15.0)
MCH: 27.8 pg (ref 26.0–34.0)
MCHC: 31.6 g/dL (ref 30.0–36.0)
MCV: 87.9 fL (ref 78.0–100.0)
Platelets: 273 10*3/uL (ref 150–400)
RBC: 4.14 MIL/uL (ref 3.87–5.11)
RDW: 14.5 % (ref 11.5–15.5)
WBC: 9.3 10*3/uL (ref 4.0–10.5)

## 2014-03-12 LAB — I-STAT CHEM 8, ED
BUN: 7 mg/dL (ref 6–23)
Calcium, Ion: 1.21 mmol/L (ref 1.12–1.23)
Chloride: 105 mEq/L (ref 96–112)
Creatinine, Ser: 0.9 mg/dL (ref 0.50–1.10)
Glucose, Bld: 103 mg/dL — ABNORMAL HIGH (ref 70–99)
HCT: 39 % (ref 36.0–46.0)
Hemoglobin: 13.3 g/dL (ref 12.0–15.0)
Potassium: 3.5 mEq/L — ABNORMAL LOW (ref 3.7–5.3)
Sodium: 142 mEq/L (ref 137–147)
TCO2: 26 mmol/L (ref 0–100)

## 2014-03-12 LAB — I-STAT TROPONIN, ED: Troponin i, poc: 0.02 ng/mL (ref 0.00–0.08)

## 2014-03-12 LAB — TROPONIN I: Troponin I: <0.3 ng/mL (ref <0.30)

## 2014-03-12 LAB — CREATININE, SERUM
Creatinine, Ser: 0.73 mg/dL (ref 0.50–1.10)
GFR calc Af Amer: >90 mL/min (ref >90)
GFR calc non Af Amer: >90 mL/min (ref >90)

## 2014-03-12 LAB — PRO B NATRIURETIC PEPTIDE: Pro B Natriuretic peptide (BNP): 1452 pg/mL — ABNORMAL HIGH (ref 0–125)

## 2014-03-12 MED ORDER — ALBUTEROL SULFATE (2.5 MG/3ML) 0.083% IN NEBU
5.0000 mg | INHALATION_SOLUTION | Freq: Once | RESPIRATORY_TRACT | Status: AC
  Administered 2014-03-12: 2.5 mg via RESPIRATORY_TRACT
  Filled 2014-03-12: qty 6

## 2014-03-12 MED ORDER — IPRATROPIUM-ALBUTEROL 0.5-2.5 (3) MG/3ML IN SOLN
3.0000 mL | Freq: Once | RESPIRATORY_TRACT | Status: AC
  Administered 2014-03-12: 3 mL via RESPIRATORY_TRACT
  Filled 2014-03-12: qty 3

## 2014-03-12 MED ORDER — SODIUM CHLORIDE 0.9 % IJ SOLN
3.0000 mL | Freq: Two times a day (BID) | INTRAMUSCULAR | Status: DC
Start: 2014-03-12 — End: 2014-03-15
  Administered 2014-03-12 – 2014-03-15 (×6): 3 mL via INTRAVENOUS

## 2014-03-12 MED ORDER — POTASSIUM CHLORIDE CRYS ER 20 MEQ PO TBCR
20.0000 meq | EXTENDED_RELEASE_TABLET | Freq: Every day | ORAL | Status: DC
  Administered 2014-03-12 – 2014-03-13 (×2): 20 meq via ORAL
  Filled 2014-03-12: qty 1

## 2014-03-12 MED ORDER — ALBUTEROL SULFATE (2.5 MG/3ML) 0.083% IN NEBU: 3.0000 mL | INHALATION_SOLUTION | Freq: Four times a day (QID) | RESPIRATORY_TRACT | Status: DC | PRN

## 2014-03-12 MED ORDER — AMLODIPINE BESYLATE 5 MG PO TABS
5.0000 mg | ORAL_TABLET | Freq: Every morning | ORAL | Status: DC
  Administered 2014-03-13 – 2014-03-15 (×3): 5 mg via ORAL
  Filled 2014-03-12 (×3): qty 1

## 2014-03-12 MED ORDER — OXYCODONE-ACETAMINOPHEN 5-325 MG PO TABS
2.0000 | ORAL_TABLET | Freq: Once | ORAL | Status: AC
  Administered 2014-03-12: 2 via ORAL
  Filled 2014-03-12: qty 2

## 2014-03-12 MED ORDER — POTASSIUM CHLORIDE CRYS ER 20 MEQ PO TBCR
EXTENDED_RELEASE_TABLET | ORAL | Status: AC
  Filled 2014-03-12: qty 1

## 2014-03-12 MED ORDER — ALBUTEROL SULFATE HFA 108 (90 BASE) MCG/ACT IN AERS: 2.0000 | INHALATION_SPRAY | Freq: Four times a day (QID) | RESPIRATORY_TRACT | Status: DC | PRN

## 2014-03-12 MED ORDER — ONDANSETRON HCL 4 MG PO TABS: 4.0000 mg | ORAL_TABLET | Freq: Four times a day (QID) | ORAL | Status: DC | PRN

## 2014-03-12 MED ORDER — ASPIRIN EC 81 MG PO TBEC
81.0000 mg | DELAYED_RELEASE_TABLET | Freq: Every day | ORAL | Status: DC
  Administered 2014-03-12 – 2014-03-15 (×4): 81 mg via ORAL
  Filled 2014-03-12 (×6): qty 1

## 2014-03-12 MED ORDER — ONDANSETRON HCL 4 MG/2ML IJ SOLN: 4.0000 mg | Freq: Four times a day (QID) | INTRAMUSCULAR | Status: DC | PRN

## 2014-03-12 MED ORDER — ASPIRIN 81 MG PO CHEW
CHEWABLE_TABLET | ORAL | Status: AC
  Filled 2014-03-12: qty 1

## 2014-03-12 MED ORDER — FUROSEMIDE 10 MG/ML IJ SOLN: 80.0000 mg | Freq: Once | INTRAMUSCULAR | Status: DC

## 2014-03-12 MED ORDER — PREDNISONE 50 MG PO TABS
60.0000 mg | ORAL_TABLET | Freq: Once | ORAL | Status: AC
  Administered 2014-03-12: 17:00:00 60 mg via ORAL
  Filled 2014-03-12 (×2): qty 1

## 2014-03-12 MED ORDER — HEPARIN SODIUM (PORCINE) 5000 UNIT/ML IJ SOLN
5000.0000 [IU] | Freq: Three times a day (TID) | INTRAMUSCULAR | Status: DC
  Administered 2014-03-12 – 2014-03-15 (×8): 5000 [IU] via SUBCUTANEOUS
  Filled 2014-03-12 (×8): qty 1

## 2014-03-12 MED ORDER — FUROSEMIDE 10 MG/ML IJ SOLN
40.0000 mg | Freq: Two times a day (BID) | INTRAMUSCULAR | Status: DC
  Administered 2014-03-13 – 2014-03-14 (×3): 40 mg via INTRAVENOUS
  Filled 2014-03-12 (×3): qty 4

## 2014-03-12 MED ORDER — FUROSEMIDE 10 MG/ML IJ SOLN
80.0000 mg | Freq: Once | INTRAMUSCULAR | Status: AC
  Administered 2014-03-12: 80 mg via INTRAVENOUS
  Filled 2014-03-12: qty 8

## 2014-03-12 NOTE — ED Notes (Signed)
Pt c/o increasing sob and productive cough-clear sputum since yesterday.

## 2014-03-12 NOTE — H&P (Signed)
[facility] History and Physical  CHAVELLE GAMBLER [id number] DOB: 14-Jun-1961 DOA: 03/12/2014  Referring physician: Dr. Jeanell Sparrow, ER. PCP: Maggie Font, MD   Chief Complaint: Dyspnea.  HPI: Kari [name] is a 53 y.o. female  This is a 53 year old morbidly obese, hypertensive lady who presents with dyspnea which started yesterday evening. This was associated with wheezing. This morning, after she woke up, she became more short of breath and it felt like her usual asthma attack. She has a history of asthma . She did have a cough productive of white sputum. There was no fever or chest pain. She also has noticed increased swelling of both her legs more than usual. She does have a history of severe bilateral venous hypertension, worse on the right than on the left. She also has a history of sarcoidosis.   Review of Systems:  Constitutional:  No weight loss, night sweats, Fevers, chills, fatigue.  HEENT:  No headaches, Difficulty swallowing,Tooth/dental problems,Sore throat,  No sneezing, itching, ear ache, nasal congestion, post nasal drip,  Cardio-vascular:  No chest pain, Orthopnea, PND,  dizziness, palpitations  GI:  No heartburn, indigestion, abdominal pain, nausea, vomiting, diarrhea, change in bowel habits, loss of appetite  Resp:   No excess mucus, no productive cough,  No coughing up of blood.No change in color of mucus..No chest wall deformity  Skin:  no rash or lesions.  GU:  no dysuria, change in color of urine, no urgency or frequency. No flank pain.  Musculoskeletal:  No joint pain or swelling. No decreased range of motion. No back pain.  Psych:  No change in mood or affect. No depression or anxiety. No memory loss.   Past Medical History  Diagnosis Date  . Hypertension   . Depression   . Asthma   . Ulcer     left posterior calf  . Headache(784.0)   . Hyperlipidemia   . Lung nodules   . Sarcoidosis   . CHF (congestive heart failure) 03/12/2014   Past  Surgical History  Procedure Laterality Date  . No past surgeries    . Cataract surgery Left 07-2013   Social History:  reports that she has never smoked. She has never used smokeless tobacco. She reports that she does not drink alcohol or use illicit drugs.  No Known Allergies  Family History  Problem Relation Age of Onset  . Heart failure Mother   . Heart disease Father      Prior to Admission medications   Medication Sig Start Date End Date Taking? Authorizing Provider  albuterol (PROVENTIL HFA;VENTOLIN HFA) 108 (90 BASE) MCG/ACT inhaler Inhale 2 puffs into the lungs every 6 (six) hours as needed for shortness of breath.   Yes Historical Provider, MD  amLODipine (NORVASC) 5 MG tablet Take 5 mg by mouth every morning.    Yes Historical Provider, MD  aspirin EC 81 MG tablet Take 81 mg by mouth daily.   Yes Historical Provider, MD  doxycycline (VIBRA-TABS) 100 MG tablet Take 100 mg by mouth 2 (two) times daily. 7 day course   Yes Historical Provider, MD  ibuprofen (ADVIL,MOTRIN) 800 MG tablet Take 800 mg by mouth every 8 (eight) hours as needed.   Yes Historical Provider, MD   Physical Exam: Filed Vitals:   03/12/14 1815  BP: 154/98  Pulse: 86  Temp:   Resp: 20    BP 154/98  Pulse 86  Temp(Src) 98.3 F (36.8 C)  Resp 20  Ht 5\' 7"  (1.702 m)  Wt 122.471 kg (270 lb)  BMI 42.28 kg/m2  SpO2 96%  LMP 11/21/2011  General:  Appears calm and comfortable. She is morbidly obese. Eyes: PERRL, normal lids, irises & conjunctiva ENT: grossly normal hearing, lips & tongue Neck: no LAD, masses or thyromegaly Cardiovascular: She has a sinus tachycardia at rest. She appears to have a gallop rhythm. Jugular venous pressure is not elevated although she has a thick neck which makes it difficult to assess this. She does have some pitting edema in both her legs. Telemetry: SR, no arrhythmias  Respiratory: There is no increased work of breathing. However, she has bilateral inspiratory crackles  from the mid zone to the lower zones. There is no wheezing at the present time. Abdomen: soft, ntnd Skin: no rash or induration seen on limited exam Musculoskeletal: grossly normal tone BUE/BLE Psychiatric: grossly normal mood and affect, speech fluent and appropriate Neurologic: grossly non-focal.          Labs on Admission:  Basic Metabolic Panel:  Recent Labs Lab 03/12/14 1559  NA 142  K 3.5*  CL 105  GLUCOSE 103*  BUN 7  CREATININE 0.90       CBC:  Recent Labs Lab 03/12/14 1559  HGB 13.3  HCT 39.0      BNP (last 3 results)  Recent Labs  03/12/14 1543  PROBNP 1452.0*      Radiological Exams on Admission: Dg Chest 2 View  03/12/2014   CLINICAL DATA:  Shortness of breath  EXAM: CHEST  2 VIEW  COMPARISON:  09/01/2012; correlation CT chest 10/08/2013  FINDINGS: Enlargement of cardiac silhouette with pulmonary vascular congestion.  Mild elongation of thoracic aorta.  Minimal atelectasis in right upper lobe and at right base.  Remaining lungs clear.  No pleural effusion or pneumothorax.  Bones unremarkable.  IMPRESSION: Minimal right lung atelectasis.  Enlargement of cardiac silhouette with pulmonary vascular congestion.  No definite infiltrate.   Electronically Signed   By: Lavonia Dana M.D.   On: 03/12/2014 17:00    EKG: Independently reviewed. Normal sinus rhythm without any acute ST-T wave changes.  Assessment/Plan   1. Acute congestive heart failure. Previous echocardiogram showed ejection fraction around 50%. She has a history of hypertension and is morbidly obese. 2. Hypertension. 3. Morbid obesity. 4. Sarcoidosis. 5. History of asthma.  Plan: 1. Admit to telemetry. 2. Intravenous diuresis with Lasix. Daily weights. Low sodium diet. 3. Serial cardiac enzymes, although I do not think she has had an acute coronary syndrome. 4. 2-D echocardiogram.   Further recommendations will depend on patient's hospital progress.   Code Status: Full code.    Family Communication: Discuss plan with patient at the bedside.   Disposition Plan: Home when medically stable.  Time spent: 45 minutes.  Doree Albee [facility] [phone].

## 2014-03-12 NOTE — ED Provider Notes (Signed)
CSN: [id number]     Arrival date & time 03/12/14  1510 History   First MD Initiated Contact with Patient 03/12/14 1511     Chief Complaint  Patient presents with  . Asthma     (Consider location/radiation/quality/duration/timing/severity/associated sxs/prior Treatment) HPI 53 y.o. Female complains of asthma attack that began this am when expose to cigar smoke.  Patient describes eye watering, throat burning, followed by cough and wheezing.  She used her albuterol mdi several times but continues to feel dyspneic.  She has cough productive of clear sputum that began with this.  She has increased sob with exertion and lying flat.  She has previously used oxygen at home but not recently.  She has some chest tightness which she associates with asthma.  She denies fever, chills, laterlaized leg swelling, history of pe.  She has not been hospitalized just for asthma.  She has recently started seeing [place] pulmonology for her asthma.  Dr. Berdine Addison is her primary care doctor.  She was hospitalized last fall for infection related to venous stasis ulcers.  These are improved but she does feel like she has some increased bilateral edema over several days.   Past Medical History  Diagnosis Date  . Hypertension   . Depression   . Asthma   . Ulcer     left posterior calf  . Headache(784.0)   . Hyperlipidemia   . Lung nodules   . Sarcoidosis    Past Surgical History  Procedure Laterality Date  . No past surgeries    . Cataract surgery Left 07-2013   Family History  Problem Relation Age of Onset  . Heart failure Mother   . Heart disease Father    History  Substance Use Topics  . Smoking status: Never Smoker   . Smokeless tobacco: Never Used  . Alcohol Use: No   OB History   Grav Para Term Preterm Abortions TAB SAB Ect Mult Living                 Review of Systems  All other systems reviewed and are negative.      Allergies  Review of patient's allergies indicates no known  allergies.  Home Medications   Current Outpatient Rx  Name  Route  Sig  Dispense  Refill  . albuterol (PROVENTIL HFA;VENTOLIN HFA) 108 (90 BASE) MCG/ACT inhaler   Inhalation   Inhale 2 puffs into the lungs every 6 (six) hours as needed for shortness of breath.         Marland Kitchen amLODipine (NORVASC) 5 MG tablet   Oral   Take 5 mg by mouth daily.         . feeding supplement, RESOURCE BREEZE, (RESOURCE BREEZE) LIQD   Oral   Take 1 Container by mouth 2 (two) times daily between meals.      0   . ibuprofen (ADVIL,MOTRIN) 800 MG tablet   Oral   Take 800 mg by mouth every 8 (eight) hours as needed.         . silver sulfADIAZINE (SILVADENE) 1 % cream   Topical   Apply topically 2 (two) times daily.   50 g   0   . Wound Dressings (SILVASORB) GEL   Apply externally   Apply topically once a week.          BP 149/101  Pulse 96  Temp(Src) 98.3 F (36.8 C)  Resp 22  Ht 5\' 7"  (1.702 m)  Wt 270 lb (122.471 kg)  BMI 42.28 kg/m2  SpO2 96%  LMP 11/21/2011 Physical Exam  Nursing note and vitals reviewed. Constitutional:  moridly obese    ED Course  Procedures (including critical care time) Labs Review Labs Reviewed  PRO B NATRIURETIC PEPTIDE - Abnormal; Notable for the following:    Pro B Natriuretic peptide (BNP) 1452.0 (*)    All other components within normal limits  I-STAT CHEM 8, ED - Abnormal; Notable for the following:    Potassium 3.5 (*)    Glucose, Bld 103 (*)    All other components within normal limits  I-STAT TROPOININ, ED   Imaging Review Dg Chest 2 View  03/12/2014   CLINICAL DATA:  Shortness of breath  EXAM: CHEST  2 VIEW  COMPARISON:  09/01/2012; correlation CT chest 10/08/2013  FINDINGS: Enlargement of cardiac silhouette with pulmonary vascular congestion.  Mild elongation of thoracic aorta.  Minimal atelectasis in right upper lobe and at right base.  Remaining lungs clear.  No pleural effusion or pneumothorax.  Bones unremarkable.  IMPRESSION: Minimal  right lung atelectasis.  Enlargement of cardiac silhouette with pulmonary vascular congestion.  No definite infiltrate.   Electronically Signed   By: Lavonia Dana M.D.   On: 03/12/2014 17:00  I have reviewed the report and personally reviewed the above radiology studies.     EKG Interpretation   Date/Time:  Friday March 12 2014 15:59:57 EDT Ventricular Rate:  88 PR Interval:  204 QRS Duration: 88 QT Interval:  402 QTC Calculation: 486 R Axis:   36 Text Interpretation:  Sinus rhythm with Fusion complexes Cannot rule out  Anterior infarct , age undetermined Abnormal ECG Confirmed by Mauri Temkin MD,  Andee Poles [phone]) on 03/12/2014 4:04:41 PM      MDM   Final diagnoses:  None    Dyspnea with likely mixed etiology but appears to have new chf along with asthma and sarcoid.  Patient endorses increased weight and peripheral edema likely secondary to some dietary indiscretion.  Patient being diuresed with lasix and on monitor.  Patient speaking in full sentences but dyspneic with any exertion.  Plan admission for further diuresis and cardiac evaluation. ECho reviewed from 2013 and ef 55%.  Discussed with Dr. Anastasio Champion and he will be in to see and admit patient.   Shaune Pollack, MD 03/12/14 [phone]

## 2014-03-13 ENCOUNTER — Inpatient Hospital Stay (HOSPITAL_COMMUNITY): Payer: Medicaid Other

## 2014-03-13 DIAGNOSIS — I059 Rheumatic mitral valve disease, unspecified: Secondary | ICD-10-CM

## 2014-03-13 LAB — COMPREHENSIVE METABOLIC PANEL
ALT: 7 U/L (ref 0–35)
AST: 12 U/L (ref 0–37)
Albumin: 3.3 g/dL — ABNORMAL LOW (ref 3.5–5.2)
Alkaline Phosphatase: 57 U/L (ref 39–117)
BUN: 10 mg/dL (ref 6–23)
CO2: 29 mEq/L (ref 19–32)
Calcium: 9.6 mg/dL (ref 8.4–10.5)
Chloride: 99 mEq/L (ref 96–112)
Creatinine, Ser: 0.75 mg/dL (ref 0.50–1.10)
GFR calc Af Amer: >90 mL/min (ref >90)
GFR calc non Af Amer: >90 mL/min (ref >90)
Glucose, Bld: 124 mg/dL — ABNORMAL HIGH (ref 70–99)
Potassium: 4.2 mEq/L (ref 3.7–5.3)
Sodium: 137 mEq/L (ref 137–147)
Total Bilirubin: 0.5 mg/dL (ref 0.3–1.2)
Total Protein: 8.2 g/dL (ref 6.0–8.3)

## 2014-03-13 LAB — TROPONIN I
Troponin I: <0.3 ng/mL (ref <0.30)
Troponin I: <0.3 ng/mL (ref <0.30)

## 2014-03-13 LAB — CBC
HCT: 37.1 % (ref 36.0–46.0)
Hemoglobin: 11.8 g/dL — ABNORMAL LOW (ref 12.0–15.0)
MCH: 27.8 pg (ref 26.0–34.0)
MCHC: 31.8 g/dL (ref 30.0–36.0)
MCV: 87.5 fL (ref 78.0–100.0)
Platelets: 303 10*3/uL (ref 150–400)
RBC: 4.24 MIL/uL (ref 3.87–5.11)
RDW: 14.7 % (ref 11.5–15.5)
WBC: 9.8 10*3/uL (ref 4.0–10.5)

## 2014-03-13 LAB — HEMOGLOBIN A1C
Hgb A1c MFr Bld: 5.5 % (ref <5.7)
Mean Plasma Glucose: 111 mg/dL (ref <117)

## 2014-03-13 LAB — PRO B NATRIURETIC PEPTIDE: Pro B Natriuretic peptide (BNP): 2003 pg/mL — ABNORMAL HIGH (ref 0–125)

## 2014-03-13 LAB — TSH: TSH: 0.625 u[IU]/mL (ref 0.350–4.500)

## 2014-03-13 MED ORDER — SODIUM CHLORIDE 0.9 % IJ SOLN: 3.0000 mL | INTRAMUSCULAR | Status: DC | PRN

## 2014-03-13 MED ORDER — SODIUM CHLORIDE 0.9 % IV SOLN
250.0000 mL | INTRAVENOUS | Status: DC | PRN
  Administered 2014-03-13: 250 mL via INTRAVENOUS

## 2014-03-13 MED ORDER — OXYCODONE-ACETAMINOPHEN 5-325 MG PO TABS
1.0000 | ORAL_TABLET | Freq: Four times a day (QID) | ORAL | Status: DC | PRN
  Administered 2014-03-13 (×2): 1 via ORAL
  Filled 2014-03-13 (×2): qty 1

## 2014-03-13 NOTE — Progress Notes (Signed)
Utilization review Completed Gissela Bloch RN BSN   

## 2014-03-13 NOTE — Progress Notes (Signed)
  PROGRESS NOTE  Kari [name] [id number] DOB: [birth year] DOA: 03/12/2014 PCP: Kari Font, MD Pulmonologist Kari Delton, MD   Summary: 53 year old woman presented with shortness of breath, cough, increasing lower extremity edema. Initial evaluation suggest acute heart failure.  Assessment/Plan: 1. Possible acute heart failure, type unknown. Echocardiogram 08/2012 LVEF 50-55%. Normal wall motion. No comment on diastolic function. No apparent valvular abnormalities. 2. Asthma. Stable. Followed by [facility] pulmonology. 3. Venous stasis ulcers, history of severe bilateral venous hypertension right greater than left. Stable. 4. Bilateral lower extremity edema. Improving. History of massive bilateral lower extremity swelling. Venous ultrasound in the past has documented severe deep venous reflux. Could consider discontinuing Norvasc, deferred to the outpatient setting.  5. Hypertension. Stable.  6. Sarcoidosis, pulmonary nodules, mediastinal lymphadenopathy. Nodule stable by CT scan per pulmonology. Lymphadenopathy showed improvement by last CT scan. No biopsy recommended by pulmonology.   Await echocardiogram, continue diuresis. Follow weight, I/O. -1.5 L.  Wean oxygen  Code Status: full code DVT prophylaxis: heparin Family Communication:  Disposition Plan: home when improved  Kari Hodgkins, MD  [facility]  Pager [phone] If 7PM-7AM, please contact night-coverage at [email or web address], password [facility] 03/13/2014, 8:24 AM  LOS: 1 day   Consultants:    Procedures:    Antibiotics:    HPI/Subjective: Feels much better. Breathing back to baseline. Decreasing edema. No complaints.  Objective: Filed Vitals:   03/12/14 1815 03/12/14 2204 03/12/14 2212 03/13/14 0536  BP: 154/98 157/83  122/53  Pulse: 86 83 78 84  Temp:  98.3 F (36.8 C)  97.6 F (36.4 C)  TempSrc:  Oral  Oral  Resp: 20 20 20 20   Height:  5\' 7"  (1.702 m)    Weight:  144.1 kg (317 lb 10.9 oz)   143.6 kg (316 lb 9.3 oz)  SpO2: 96% 96% 98% 98%    Intake/Output Summary (Last 24 hours) at 03/13/14 0824 Last data filed at 03/13/14 0543  Gross per 24 hour  Intake    480 ml  Output   2200 ml  Net  -1720 ml     Filed Weights   03/12/14 1516 03/12/14 2204 03/13/14 0536  Weight: 122.471 kg (270 lb) 144.1 kg (317 lb 10.9 oz) 143.6 kg (316 lb 9.3 oz)    Exam:   Afebrile, vital signs are stable. Stable hypoxia on 2 L.  Gen. Appears calm and comfortable speech fluent and clear.  Cardiovascular regular rate and rhythm. No murmur, rub or gallop. 2+ bilateral lower extremity edema.  Respiratory clear to auscultation bilaterally. No wheezes, rales or rhonchi. Normal respiratory effort.  Psychiatric grossly normal mood and affect.  Skin chronic lower extremity venous stasis changes. No evidence of cellulitis.  Data Reviewed:  Basic metabolic panel, hepatic function panel unremarkable.  Troponins negative.   CBC unremarkable.  Chest x-ray improved pulmonary vascular congestion.  Scheduled Meds: . amLODipine  5 mg Oral q morning - 10a  . aspirin EC  81 mg Oral Daily  . furosemide  40 mg Intravenous Q12H  . heparin  5,000 Units Subcutaneous 3 times per day  . potassium chloride  20 mEq Oral Daily  . sodium chloride  3 mL Intravenous Q12H   Continuous Infusions:   Active Problems:   Asthma   Hypertension   Obesity   CHF (congestive heart failure)   Sarcoidosis   Time spent 25 minutes

## 2014-03-14 DIAGNOSIS — I5031 Acute diastolic (congestive) heart failure: Principal | ICD-10-CM

## 2014-03-14 LAB — BASIC METABOLIC PANEL
BUN: 15 mg/dL (ref 6–23)
CO2: 31 mEq/L (ref 19–32)
Calcium: 9.3 mg/dL (ref 8.4–10.5)
Chloride: 100 mEq/L (ref 96–112)
Creatinine, Ser: 0.89 mg/dL (ref 0.50–1.10)
GFR calc Af Amer: 85 mL/min — ABNORMAL LOW (ref >90)
GFR calc non Af Amer: 73 mL/min — ABNORMAL LOW (ref >90)
Glucose, Bld: 102 mg/dL — ABNORMAL HIGH (ref 70–99)
Potassium: 3.4 mEq/L — ABNORMAL LOW (ref 3.7–5.3)
Sodium: 141 mEq/L (ref 137–147)

## 2014-03-14 LAB — MAGNESIUM: Magnesium: 1.8 mg/dL (ref 1.5–2.5)

## 2014-03-14 MED ORDER — MAGNESIUM OXIDE 400 (241.3 MG) MG PO TABS
400.0000 mg | ORAL_TABLET | Freq: Two times a day (BID) | ORAL | Status: DC
  Administered 2014-03-14 – 2014-03-15 (×2): 400 mg via ORAL
  Filled 2014-03-14 (×2): qty 1

## 2014-03-14 MED ORDER — POTASSIUM CHLORIDE CRYS ER 20 MEQ PO TBCR
40.0000 meq | EXTENDED_RELEASE_TABLET | Freq: Once | ORAL | Status: AC
  Administered 2014-03-14: 40 meq via ORAL
  Filled 2014-03-14: qty 2

## 2014-03-14 MED ORDER — POTASSIUM CHLORIDE CRYS ER 20 MEQ PO TBCR
40.0000 meq | EXTENDED_RELEASE_TABLET | Freq: Every day | ORAL | Status: DC
  Administered 2014-03-14 – 2014-03-15 (×2): 40 meq via ORAL
  Filled 2014-03-14 (×2): qty 2

## 2014-03-14 MED ORDER — FUROSEMIDE 20 MG PO TABS
20.0000 mg | ORAL_TABLET | Freq: Every day | ORAL | Status: DC
  Administered 2014-03-15: 20 mg via ORAL
  Filled 2014-03-14: qty 1

## 2014-03-14 NOTE — Progress Notes (Signed)
  PROGRESS NOTE  Kari [name] [id number] DOB: 06-Mar-1961 DOA: 03/12/2014 PCP: Maggie Font, MD Pulmonologist [name], MD   Summary: 53 year old woman presented with shortness of breath, cough, increasing lower extremity edema. Initial evaluation suggest acute heart failure.  Assessment/Plan: 1. Acute diastolic congestive heart failure. Significant improvement with resolution of pulmonary symptoms and decreasing lower extremity edema. 2. Asthma. Stable. Followed by [facility] pulmonology. 3. Venous stasis ulcers, history of severe bilateral venous hypertension right greater than left. Stable. No evidence of infection. 4. Bilateral lower extremity edema. Rapidly improving. History of massive bilateral lower extremity swelling. Venous ultrasound in the past has documented severe deep venous reflux. Could consider discontinuing Norvasc, deferred to the outpatient setting.  5. Hypertension. Stable.  6. Sarcoidosis, pulmonary nodules, mediastinal lymphadenopathy. Nodule stable by CT scan per pulmonology. Lymphadenopathy showed improvement by last CT scan. No biopsy recommended by pulmonology.   Overall much improved. Change to low-dose Lasix tomorrow.  Replete potassium, magnesium. Continue telemetry.  Code Status: full code DVT prophylaxis: heparin Family Communication:  Disposition Plan: home when improved  Murray Hodgkins, MD  [facility]  Pager [phone] If 7PM-7AM, please contact night-coverage at [email or web address], password [facility] 03/14/2014, 4:08 PM  LOS: 2 days   Consultants:    Procedures:  2-D echocardiogram. Left ventricular ejection fraction 50-55%. Normal wall motion. Grade 1 diastolic dysfunction.  Antibiotics:    HPI/Subjective: Continues to feel better. Breathing better with ambulation. Significant decrease in bilateral lower extremity edema.  Objective: Filed Vitals:   03/13/14 1900 03/14/14 0543 03/14/14 0703 03/14/14 1430  BP: 140/85  129/88  132/70  Pulse: 87  81 80  Temp: 98.3 F (36.8 C)  97.4 F (36.3 C) 98 F (36.7 C)  TempSrc: Oral  Oral Oral  Resp: 20  20 20   Height:      Weight:  139.2 kg (306 lb 14.1 oz)    SpO2: 95%  96% 95%    Intake/Output Summary (Last 24 hours) at 03/14/14 1608 Last data filed at 03/14/14 1200  Gross per 24 hour  Intake    363 ml  Output   3400 ml  Net  -3037 ml     Filed Weights   03/12/14 2204 03/13/14 0536 03/14/14 0543  Weight: 144.1 kg (317 lb 10.9 oz) 143.6 kg (316 lb 9.3 oz) 139.2 kg (306 lb 14.1 oz)    Exam:   Afebrile, vital signs are stable. Hypoxia resolved.  Gen. Appears comfortable, calm.  Psychiatric. Speech fluent and clear.  Cardiovascular regular rate and rhythm. No murmur, gallop, rub. Significantly decreased bilateral lower extremity edema with skin wrinkling. 1+ bilaterally.  Telemetry shows sinus rhythm with several episodes of nonsustained V. tach/PVCs.  Respiratory clear to auscultation bilaterally. No wheezes, rales or rhonchi. Normal respiratory effort.  Data Reviewed:  Weight down approximately 10 pounds since admission. -4.877 L since admission.  Potassium 3.4. Basic metabolic panel otherwise unremarkable. Magnesium 1.8.  Scheduled Meds: . amLODipine  5 mg Oral q morning - 10a  . aspirin EC  81 mg Oral Daily  . furosemide  40 mg Intravenous Q12H  . heparin  5,000 Units Subcutaneous 3 times per day  . potassium chloride  40 mEq Oral Daily  . sodium chloride  3 mL Intravenous Q12H   Continuous Infusions:   Principal Problem:   CHF (congestive heart failure) Active Problems:   Asthma   Hypertension   Obesity   Sarcoidosis   Time spent 15 minutes

## 2014-03-15 LAB — BASIC METABOLIC PANEL
BUN: 19 mg/dL (ref 6–23)
CO2: 34 mEq/L — ABNORMAL HIGH (ref 19–32)
Calcium: 9.4 mg/dL (ref 8.4–10.5)
Chloride: 100 mEq/L (ref 96–112)
Creatinine, Ser: 0.91 mg/dL (ref 0.50–1.10)
GFR calc Af Amer: 83 mL/min — ABNORMAL LOW (ref >90)
GFR calc non Af Amer: 71 mL/min — ABNORMAL LOW (ref >90)
Glucose, Bld: 109 mg/dL — ABNORMAL HIGH (ref 70–99)
Potassium: 4.2 mEq/L (ref 3.7–5.3)
Sodium: 140 mEq/L (ref 137–147)

## 2014-03-15 LAB — MAGNESIUM: Magnesium: 2.2 mg/dL (ref 1.5–2.5)

## 2014-03-15 MED ORDER — FUROSEMIDE 20 MG PO TABS: 20.0000 mg | ORAL_TABLET | Freq: Every day | ORAL | Status: DC

## 2014-03-15 MED ORDER — POTASSIUM CHLORIDE CRYS ER 20 MEQ PO TBCR: 20.0000 meq | EXTENDED_RELEASE_TABLET | Freq: Every day | ORAL | Status: DC

## 2014-03-15 NOTE — Discharge Summary (Signed)
Physician Discharge Summary  ARIHANNA MELLICK [id number] DOB: 1961/06/14 DOA: 03/12/2014  PCP: Maggie Font, MD  Admit date: 03/12/2014 Discharge date: 03/15/2014  Recommendations for Outpatient Follow-up:  1. New diagnosis of diastolic heart failure. Could consider discontinuing the Norvasc if lower extremity edema recurs despite diuretic therapy. 2. Moderate mitral regurgitation with possible ruptured chordae.  Follow-up Information   Follow up with [facility] K, MD. Schedule an appointment as soon as possible for a visit in 3 weeks.   Specialty:  Family Medicine   Contact information:   [address] [place] [address] [phone]       Follow up with Jory Sims, NP On 03/26/2014. (8:30 AM)    Specialty:  Nurse Practitioner   Contact information:   [address] [place] [address] [phone]      Discharge Diagnoses:  1. Acute diastolic congestive heart failure with volume overload 2. Moderate mitral regurgitation with possible ruptured chordae 3. Asthma 4. Chronic lower extremity edema  Discharge Condition: Improved Disposition: Home  Diet recommendation: Heart healthy, low salt  Filed Weights   03/13/14 0536 03/14/14 0543 03/15/14 0453  Weight: 143.6 kg (316 lb 9.3 oz) 139.2 kg (306 lb 14.1 oz) 137.8 kg (303 lb 12.7 oz)    History of present illness:  53 year old woman presented with shortness of breath, cough, increasing lower extremity edema. Initial evaluation suggest acute heart failure.  Hospital Course:  Treated empirically for heart failure with IV Lasix with substantial diuresis an approximate 14 pound weight loss. 2-D echocardiogram revealed diastolic dysfunction and mitral regurgitation. At time of discharge patient clinically euvolemic with stable creatinine. Hospitalization was uncomplicated. Individual issues as below.  1. Acute diastolic congestive heart failure. No diagnosis. Acute component resolved. Well  compensated. Asymptomatic. 2. Moderate mitral regurgitation with possible ruptured chordae. Asymptomatic. Discussed with cardiology Dr. Bronson Ing, recommends no further inpatient evaluation, outpatient followup. Also discussed NSVT, given asymptomatic, no further evaluation recommended. 3. Asthma. Stable. Followed by [facility] pulmonology. 4. Venous stasis ulcers, history of severe bilateral venous hypertension right greater than left. Stable. No evidence of infection. 5. Bilateral lower extremity edema. Rapidly improving. History of massive bilateral lower extremity swelling. Venous ultrasound in the past has documented severe deep venous reflux. Could consider discontinuing Norvasc, deferred to the outpatient setting.  6. Hypertension. Stable.  7. Sarcoidosis, pulmonary nodules, mediastinal lymphadenopathy. Nodule stable by CT scan per pulmonology. Lymphadenopathy showed improvement by last CT scan. No biopsy recommended by pulmonology. Appears stable.  Consultants: none Procedures:  2-D echocardiogram. Left ventricular ejection fraction 50-55%. Normal wall motion. Grade 1 diastolic dysfunction.  Discharge Instructions  Discharge Orders   Future Appointments Provider Department Dept Phone   03/26/2014 8:30 AM Fay Records, MD [place] [phone]   Future Orders Complete By Expires   ([place]) Call MD:  Anytime you have any of the following symptoms: 1) 3 pound weight gain in 24 hours or 5 pounds in 1 week 2) shortness of breath, with or without a dry hacking cough 3) swelling in the hands, feet or stomach 4) if you have to sleep on extra pillows at night in order to breathe.  As directed    Activity as tolerated - No restrictions  As directed    Diet - low sodium heart healthy  As directed    Discharge instructions  As directed    Comments:     Call your physician or seek immediate medical attention for shortness of breath, increased  edema or worsening of  condition. Check your weight daily and record in log book and bring to all appointments. Avoid extra salt in diet. Limit fluid intake to about 2 liters per day.       Medication List    STOP taking these medications       ibuprofen 800 MG tablet  Commonly known as:  ADVIL,MOTRIN      TAKE these medications       albuterol 108 (90 BASE) MCG/ACT inhaler  Commonly known as:  PROVENTIL HFA;VENTOLIN HFA  Inhale 2 puffs into the lungs every 6 (six) hours as needed for shortness of breath.     amLODipine 5 MG tablet  Commonly known as:  NORVASC  Take 5 mg by mouth every morning.     aspirin EC 81 MG tablet  Take 81 mg by mouth daily.     doxycycline 100 MG tablet  Commonly known as:  VIBRA-TABS  Take 100 mg by mouth 2 (two) times daily. 7 day course     furosemide 20 MG tablet  Commonly known as:  LASIX  Take 1 tablet (20 mg total) by mouth daily.     potassium chloride SA 20 MEQ tablet  Commonly known as:  K-DUR,KLOR-CON  Take 1 tablet (20 mEq total) by mouth daily.       No Known Allergies  The results of significant diagnostics from this hospitalization (including imaging, microbiology, ancillary and laboratory) are listed below for reference.    Significant Diagnostic Studies: Dg Chest 1 View  03/13/2014   CLINICAL DATA:  Followup of congestive heart failure.  EXAM: CHEST - 1 VIEW  COMPARISON:  DG CHEST 2 VIEW dated 03/12/2014  FINDINGS: Low lung volumes. The cardiac silhouette is enlarged. The prominence of the interstitial markings has decreased. No focal regions consolidation or focal infiltrates. The osseous structures are unremarkable.  IMPRESSION: Improved pulmonary vascular congestion without focal regions of consolidation or focal infiltrates. Stable cardiomegaly.   Electronically Signed   By: Margaree Mackintosh M.D.   On: 03/13/2014 07:28   Dg Chest 2 View  03/12/2014   CLINICAL DATA:  Shortness of breath  EXAM: CHEST  2 VIEW  COMPARISON:  09/01/2012; correlation CT  chest 10/08/2013  FINDINGS: Enlargement of cardiac silhouette with pulmonary vascular congestion.  Mild elongation of thoracic aorta.  Minimal atelectasis in right upper lobe and at right base.  Remaining lungs clear.  No pleural effusion or pneumothorax.  Bones unremarkable.  IMPRESSION: Minimal right lung atelectasis.  Enlargement of cardiac silhouette with pulmonary vascular congestion.  No definite infiltrate.   Electronically Signed   By: Lavonia Dana M.D.   On: 03/12/2014 17:00   Labs: Basic Metabolic Panel:  Recent Labs Lab 03/12/14 1559 03/12/14 1857 03/13/14 0638 03/14/14 0545 03/15/14 0523  NA 142  --  137 141 140  K 3.5*  --  4.2 3.4* 4.2  CL 105  --  99 100 100  CO2  --   --  29 31 34*  GLUCOSE 103*  --  124* 102* 109*  BUN 7  --  10 15 19   CREATININE 0.90 0.73 0.75 0.89 0.91  CALCIUM  --   --  9.6 9.3 9.4  MG  --   --   --  1.8 2.2   Liver Function Tests:  Recent Labs Lab 03/13/14 0638  AST 12  ALT 7  ALKPHOS 57  BILITOT 0.5  PROT 8.2  ALBUMIN 3.3*   CBC:  Recent  Labs Lab 03/12/14 1559 03/12/14 1857 03/13/14 0638  WBC  --  9.3 9.8  HGB 13.3 11.5* 11.8*  HCT 39.0 36.4 37.1  MCV  --  87.9 87.5  PLT  --  273 303   Cardiac Enzymes:  Recent Labs Lab 03/12/14 1857 03/13/14 0047 03/13/14 0639  TROPONINI <0.30 <0.30 <0.30     Recent Labs  03/12/14 1543 03/13/14 0638  PROBNP 1452.0* 2003.0*    Principal Problem:   Acute diastolic CHF (congestive heart failure) Active Problems:   Asthma   Hypertension   Obesity   CHF (congestive heart failure)   Sarcoidosis   Time coordinating discharge: 25 minutes  Signed:  Murray Hodgkins, MD [facility] 03/15/2014, 9:58 AM

## 2014-03-15 NOTE — Care Management Note (Signed)
    Page 1 of 1   03/15/2014     10:58:37 AM   CARE MANAGEMENT NOTE 03/15/2014  Patient:  Kari [name], Kari [name]   Account Number:  [id number]  Date Initiated:  03/15/2014  Documentation initiated by:  Claretha Cooper  Subjective/Objective Assessment:   Pt lives at home with family. Walks with a cane when needed. Independent with ADL.     Action/Plan:   Anticipated DC Date:  03/15/2014   Anticipated DC Plan:  [place]  CM consult      Choice offered to / List presented to:             Status of service:  Completed, signed off Medicare Important Message given?   (If response is "NO", the following Medicare IM given date fields will be blank) Date Medicare IM given:   Date Additional Medicare IM given:    Discharge Disposition:    Per UR Regulation:    If discussed at Long Length of Stay Meetings, dates discussed:    Comments:  03/15/14 Claretha Cooper RN BSN CM

## 2014-03-15 NOTE — Progress Notes (Signed)
03/15/14 1040 Reviewed discharge instructions with patient via teachback. Given copy of AVS, prescriptions, f/u appointments as scheduled. Noted when home medications next due on list. IV site d/c'd, within normal limits. No c/o dyspnea or pain at this time. Reviewed heart failure education packet and zone tool provided, verbalized understanding. Pt in stable condition awaiting discharge home. Donavan Foil, RN

## 2014-03-15 NOTE — Progress Notes (Signed)
PROGRESS NOTE  Kari [name] [id number] DOB: 30-Mar-1961 DOA: 03/12/2014 PCP: Maggie Font, MD Pulmonologist [name], MD   Summary: 53 year old woman presented with shortness of breath, cough, increasing lower extremity edema. Initial evaluation suggest acute heart failure.  Assessment/Plan: 1. Acute diastolic congestive heart failure. No diagnosis. Acute component resolved. Well compensated. Asymptomatic. 2. Moderate mitral regurgitation with possible ruptured chordae. Asymptomatic. Discussed with cardiology Dr. Bronson Ing, recommends no further inpatient evaluation, outpatient followup. Also discussed NSVT, given asymptomatic, no further evaluation recommended. 3. Asthma. Stable. Followed by [facility] pulmonology. 4. Venous stasis ulcers, history of severe bilateral venous hypertension right greater than left. Stable. No evidence of infection. 5. Bilateral lower extremity edema. Rapidly improving. History of massive bilateral lower extremity swelling. Venous ultrasound in the past has documented severe deep venous reflux. Could consider discontinuing Norvasc, deferred to the outpatient setting.  6. Hypertension. Stable.  7. Sarcoidosis, pulmonary nodules, mediastinal lymphadenopathy. Nodule stable by CT scan per pulmonology. Lymphadenopathy showed improvement by last CT scan. No biopsy recommended by pulmonology. Appears stable.   Discharge home today on low dose Lasix and potassium supplementation.  Follow-up with cardiology Dr. Harrington Challenger April 3rd at 8:30 AM, check BMP.  Consider discontinuing Norvasc if lower extremity edema recurs.  Code Status: full code DVT prophylaxis: heparin Family Communication:  Disposition Plan: home when improved  Murray Hodgkins, MD  [facility]  Pager [phone] If 7PM-7AM, please contact night-coverage at [email or web address], password [email or web address] 03/15/2014, 8:56 AM  LOS: 3 days   Consultants:    Procedures:  2-D echocardiogram. Left  ventricular ejection fraction 50-55%. Normal wall motion. Grade 1 diastolic dysfunction.  Antibiotics:    HPI/Subjective: Discussed with nursing. No issues overnight. Feels well, no complaints, no CP, no SOB. Wants to go home.  Objective: Filed Vitals:   03/14/14 0703 03/14/14 1430 03/14/14 2047 03/15/14 0453  BP: 129/88 132/70 102/62 122/81  Pulse: 81 80 86 69  Temp: 97.4 F (36.3 C) 98 F (36.7 C) 98.2 F (36.8 C) 97.3 F (36.3 C)  TempSrc: Oral Oral Oral Oral  Resp: 20 20 20 20   Height:      Weight:    137.8 kg (303 lb 12.7 oz)  SpO2: 96% 95% 95% 97%    Intake/Output Summary (Last 24 hours) at 03/15/14 0856 Last data filed at 03/15/14 [id number]  Gross per 24 hour  Intake    243 ml  Output   1150 ml  Net   -907 ml     Filed Weights   03/13/14 0536 03/14/14 0543 03/15/14 0453  Weight: 143.6 kg (316 lb 9.3 oz) 139.2 kg (306 lb 14.1 oz) 137.8 kg (303 lb 12.7 oz)    Exam:   Afebrile, vital signs are stable.   General. Appears calm and comfortable. Speech fluent and clear. Well-appearing.  Cardiovascular regular rate and rhythm. No murmur, rub or gallop. Trace bilateral lower extremity edema.  Telemetry sinus rhythm. 2 episodes nonsustained V. tach.  Respiratory clear to auscultation bilaterally. No wheezes, rales or rhonchi. Normal respiratory effort.  Psychiatric. Grossly normal mood and affect. Speech fluent and appropriate.  Data Reviewed:  Weight down 14 pounds since admission. -4.9 L since admission.  BMP unremarkable. Magnesium 2.2. Potassium 4.2.  Scheduled Meds: . amLODipine  5 mg Oral q morning - 10a  . aspirin EC  81 mg Oral Daily  . furosemide  20 mg Oral Daily  . heparin  5,000 Units Subcutaneous 3 times per day  . magnesium oxide  400 mg Oral BID  .  potassium chloride  40 mEq Oral Daily  . sodium chloride  3 mL Intravenous Q12H   Continuous Infusions:   Principal Problem:   Acute diastolic CHF (congestive heart failure) Active  Problems:   Asthma   Hypertension   Obesity   CHF (congestive heart failure)   Sarcoidosis

## 2014-03-15 NOTE — Progress Notes (Signed)
03/15/14 1127 Patient left floor in stable condition via w/c accompanied by nursing staff member. Discharged home. Donavan Foil, RN

## 2014-03-26 ENCOUNTER — Ambulatory Visit (INDEPENDENT_AMBULATORY_CARE_PROVIDER_SITE_OTHER): Payer: Medicaid Other | Admitting: Internal Medicine

## 2014-03-26 VITALS — BP 141/86 | HR 84 | Ht 67.0 in | Wt 317.0 lb

## 2014-03-26 DIAGNOSIS — I10 Essential (primary) hypertension: Secondary | ICD-10-CM

## 2014-03-26 MED ORDER — FUROSEMIDE 40 MG PO TABS: 40.0000 mg | ORAL_TABLET | Freq: Every day | ORAL | Status: DC

## 2014-03-26 NOTE — Progress Notes (Signed)
HPI Patient is a 53 yo with hisotry of CP in past Normal myoview in 2013.  CT chest neg for PE 2013.  Has also history of sarcoid. She was recently discharged from [facility].  Admitted for SOB and edema. Treated with IV  Diuretics for diastolic CHF  Echo showed normal LV systolic function and moderate MR. Patient says she did  Well initially but she is not responding to 20 mg Lasix.  LE edema has increased  She does not have scale at home Denies CP  Breathng is fair  Energy is down.    Current Outpatient Prescriptions   Medication        No Known Allergies  Current Outpatient Prescriptions  Medication Sig Dispense Refill  . albuterol (PROVENTIL HFA;VENTOLIN HFA) 108 (90 BASE) MCG/ACT inhaler Inhale 2 puffs into the lungs every 6 (six) hours as needed for shortness of breath.      Marland Kitchen amLODipine (NORVASC) 5 MG tablet Take 5 mg by mouth every morning.       Marland Kitchen aspirin EC 81 MG tablet Take 81 mg by mouth daily.      . furosemide (LASIX) 20 MG tablet Take 1 tablet (20 mg total) by mouth daily.  30 tablet  0  . potassium chloride SA (K-DUR,KLOR-CON) 20 MEQ tablet Take 1 tablet (20 mEq total) by mouth daily.  30 tablet  0   No current facility-administered medications for this visit.    Past Medical History  Diagnosis Date  . Hypertension   . Depression   . Asthma   . Ulcer     left posterior calf  . Headache(784.0)   . Hyperlipidemia   . Lung nodules   . Sarcoidosis   . CHF (congestive heart failure) 03/12/2014    Past Surgical History  Procedure Laterality Date  . No past surgeries    . Cataract surgery Left 07-2013    Family History  Problem Relation Age of Onset  . Heart failure Mother   . Heart disease Father     History   Social History  . Marital Status: Single    Spouse Name: N/A    Number of Children: 1  . Years of Education: N/A   Occupational History  . Not on file.   Social History Main Topics  . Smoking status: Never Smoker   . Smokeless tobacco: Never Used   . Alcohol Use: No  . Drug Use: No  . Sexual Activity: Not Currently   Other Topics Concern  . Not on file   Social History Narrative  . No narrative on file    Review of Systems:  All systems reviewed.  They are negative to the above problem except as previously stated.  Vital Signs: BP 141/86  Pulse 84  Ht 5\' 7"  (1.702 m)  Wt 317 lb (143.79 kg)  BMI 49.64 kg/m2  SpO2 100%  LMP 11/21/2011  Physical Exam Patinet is a morbidly obese 53 yo in NAD HEENT:  Normocephalic, atraumatic. EOMI, PERRLA.  Neck: JVP is normal.  No bruits.  Lungs: clear to auscultation. No rales no wheezes.  Heart: Regular rate and rhythm. Normal S1, S2. No S3.   No significant murmurs. PMI not displaced.  Abdomen:  Supple, nontender. Normal bowel sounds. No masses. No hepatomegaly.  Extremities:   Good distal pulses throughout.1+ lower extremity edema.  Musculoskeletal :moving all extremities.  Neuro:   alert and oriented x3.  CN II-XII grossly intact. LV EF: 50% - 55%  ------------------------------------------------------------ Indications:  CHF - 428.0. Shortness of breath 786.05.  ------------------------------------------------------------ History: PMH: Acquired from the patient and from the patient's chart. PMH: Asthma. CHF. Sarcoidosis. Chest Pain. Acute Renal Failure. Sepsis. Risk factors: Hypertension. Obese.  ------------------------------------------------------------ Study Conclusions  - Left ventricle: The cavity size was normal. Wall thickness was increased in a pattern of mild LVH. There was mild focal basal hypertrophy of the septum. Systolic function was normal. The estimated ejection fraction was in the range of 50% to 55%. Wall motion was normal; there were no regional wall motion abnormalities. Doppler parameters are consistent with abnormal left ventricular relaxation (grade 1 diastolic dysfunction). Doppler parameters are consistent with high ventricular filling  pressure. - Aortic valve: Trivial regurgitation. - Mitral valve: Moderate regurgitation. - Left atrium: The atrium was moderately dilated. - Right ventricle: The cavity size was mildly dilated. - Right atrium: The atrium was mildly dilated. - Pulmonary arteries: Systolic pressure was mildly increased. PA peak pressure: 24mm Hg (S). Impressions:  - Linear oscillating density in LA may be ruptured chordae.  ------------------------------------------------------------ Labs, prior tests, procedures, and surgery: Echocardiography (September 03, 2012). EF was 50-55%.  Transthoracic echocardiography. M-mode, complete 2D, spectral Doppler, and color Doppler. Height: Height: 170.2cm. Height: 67in. Weight: Weight: 122.5kg. Weight: 269.4lb. Body mass index: BMI: 42.3kg/m^2. Body surface area: BSA: 2.8m^2. Blood pressure: 154/98. Patient status: Inpatient. Location: Bedside.  ------------------------------------------------------------  ------------------------------------------------------------ Left ventricle: The cavity size was normal. Wall thickness was increased in a pattern of mild LVH. There was mild focal basal hypertrophy of the septum. Systolic function was normal. The estimated ejection fraction was in the range of 50% to 55%. Wall motion was normal; there were no regional wall motion abnormalities. Doppler parameters are consistent with abnormal left ventricular relaxation (grade 1 diastolic dysfunction). Doppler parameters are consistent with high ventricular filling pressure.  ------------------------------------------------------------ Aortic valve: Trileaflet; normal thickness leaflets. Mobility was not restricted. Doppler: Transvalvular velocity was within the normal range. There was no stenosis. Trivial regurgitation.  ------------------------------------------------------------ Aorta: Aortic root: The aortic root was normal in  size.  ------------------------------------------------------------ Mitral valve: Structurally normal valve. Mobility was not restricted. Doppler: Transvalvular velocity was within the normal range. There was no evidence for stenosis. Moderate regurgitation. Peak gradient: 65mm Hg (D).  ------------------------------------------------------------ Left atrium: The atrium was moderately dilated.  ------------------------------------------------------------ Right ventricle: The cavity size was mildly dilated. Systolic function was normal.  ------------------------------------------------------------ Pulmonic valve: Doppler: Transvalvular velocity was within the normal range. There was no evidence for stenosis.  ------------------------------------------------------------ Tricuspid valve: Structurally normal valve. Doppler: Transvalvular velocity was within the normal range. Mild regurgitation.  ------------------------------------------------------------ Pulmonary artery: Systolic pressure was mildly increased.  ------------------------------------------------------------ Right atrium: The atrium was mildly dilated.  ------------------------------------------------------------ Pericardium: There was no pericardial effusion.  ------------------------------------------------------------ Systemic veins: Inferior vena cava: The vessel was normal in size.  ------------------------------------------------------------  2D measurements Normal Doppler measurements Normal Left ventricle Main pulmonary LVID ED, 48.5 mm 43-52 artery chord, Pressure, 36 mm Hg =30 PLAX S LVID ES, 35 mm 23-38 Left ventricle chord, Ea, lat 10.7 cm/s ------ PLAX ann, tiss FS, 28 % >29 DP chord, E/Ea, lat 10.65 ------ PLAX ann, tiss LVPW, ED 12.2 mm ------ DP IVS/LVPW 1.14 <1.3 Ea, med 6.64 cm/s ------ ratio, ED ann, tiss Ventricular septum DP IVS, ED 13.96 mm ------ E/Ea, med 17.17 ------ LVOT ann,  tiss Diam, S 22 mm ------ DP Area 3.8 cm^2 ------ LVOT Diam 22 mm ------ Peak vel, 85.8 cm/s ------ Aorta S Root 31 mm ------ VTI, S 19.4 cm ------ diam,  ED Stroke vol 73.7 ml ------ Left atrium Stroke 32.1 ml/m^ ------ AP dim 53 mm ------ index 2 AP dim 2.3 cm/m^2 <2.2 Mitral valve index Peak E vel 114 cm/s ------ Peak A vel 86.3 cm/s ------ Decelerati 194 ms 150-23 on time 0 Peak 5 mm Hg ------ gradient, D Peak E/A 1.3 ------ ratio Tricuspid valve Regurg 286 cm/s ------ peak vel Peak RV-RA 33 mm Hg ------ gradient, S Max regurg 286 cm/s ------ vel Systemic veins Estimated 3 mm Hg ------ CVP Right ventricle Pressure, 36 mm Hg <30 S Sa vel, 16.5 cm/s ------ lat ann, tiss DP  ------------------------------------------------------------ Prepared and Electronically Authenticated by  Kirk Ruths 2015-03-21T13:49:05.387    Assessment and Plan:  1.  Acute on chronic diastolic CHF  Volume is up some on exam  I would recomm increasing lasix to 40 mg per day.  I would check BNP and BMET on Wed. If responds would set f/u fro 4 wks  I am not convinced that this represents ischemia but if still feeling poorly on next visit despite volume change could consider stress test.   2.  HTN  Follow on meds.    3.  CP  Currently denies.

## 2014-03-26 NOTE — Patient Instructions (Addendum)
Your physician recommends that you schedule a follow-up appointment in: 4 weeks    Your physician has recommended you make the following change in your medication:   Increase lasix to 40 mg daily   Have blood work next Wednesday 03/31/14  (BMET,BNP)  Thank you for choosing [facility] !

## 2014-04-01 LAB — BRAIN NATRIURETIC PEPTIDE: Brain Natriuretic Peptide: 178.3 pg/mL — ABNORMAL HIGH (ref 0.0–100.0)

## 2014-04-01 LAB — BASIC METABOLIC PANEL
BUN: 11 mg/dL (ref 6–23)
CO2: 30 mEq/L (ref 19–32)
Calcium: 9.2 mg/dL (ref 8.4–10.5)
Chloride: 102 mEq/L (ref 96–112)
Creat: 0.83 mg/dL (ref 0.50–1.10)
Glucose, Bld: 97 mg/dL (ref 70–99)
Potassium: 3.7 mEq/L (ref 3.5–5.3)
Sodium: 140 mEq/L (ref 135–145)

## 2014-04-06 ENCOUNTER — Encounter: Payer: Self-pay | Admitting: *Deleted

## 2014-04-10 ENCOUNTER — Encounter (HOSPITAL_COMMUNITY): Payer: Self-pay | Admitting: Emergency Medicine

## 2014-04-10 ENCOUNTER — Emergency Department (HOSPITAL_COMMUNITY)
Admission: EM | Admit: 2014-04-10 | Discharge: 2014-04-10 | Disposition: A | Discharge status: Home / Self Care | Payer: Medicaid Other | Attending: Emergency Medicine | Admitting: Emergency Medicine

## 2014-04-10 ENCOUNTER — Emergency Department (HOSPITAL_COMMUNITY): Payer: Medicaid Other

## 2014-04-10 DIAGNOSIS — R059 Cough, unspecified: Secondary | ICD-10-CM | POA: Insufficient documentation

## 2014-04-10 DIAGNOSIS — I509 Heart failure, unspecified: Secondary | ICD-10-CM | POA: Insufficient documentation

## 2014-04-10 DIAGNOSIS — R05 Cough: Secondary | ICD-10-CM | POA: Insufficient documentation

## 2014-04-10 DIAGNOSIS — Z87448 Personal history of other diseases of urinary system: Secondary | ICD-10-CM | POA: Insufficient documentation

## 2014-04-10 DIAGNOSIS — I10 Essential (primary) hypertension: Secondary | ICD-10-CM | POA: Insufficient documentation

## 2014-04-10 DIAGNOSIS — Z8639 Personal history of other endocrine, nutritional and metabolic disease: Secondary | ICD-10-CM | POA: Insufficient documentation

## 2014-04-10 DIAGNOSIS — Z862 Personal history of diseases of the blood and blood-forming organs and certain disorders involving the immune mechanism: Secondary | ICD-10-CM | POA: Insufficient documentation

## 2014-04-10 DIAGNOSIS — Z8619 Personal history of other infectious and parasitic diseases: Secondary | ICD-10-CM | POA: Insufficient documentation

## 2014-04-10 DIAGNOSIS — Z8659 Personal history of other mental and behavioral disorders: Secondary | ICD-10-CM | POA: Insufficient documentation

## 2014-04-10 DIAGNOSIS — J45901 Unspecified asthma with (acute) exacerbation: Secondary | ICD-10-CM | POA: Insufficient documentation

## 2014-04-10 DIAGNOSIS — Z872 Personal history of diseases of the skin and subcutaneous tissue: Secondary | ICD-10-CM | POA: Insufficient documentation

## 2014-04-10 HISTORY — DX: Disorder of kidney and ureter, unspecified: N28.9

## 2014-04-10 LAB — URINALYSIS, ROUTINE W REFLEX MICROSCOPIC
Bilirubin Urine: NEGATIVE
Glucose, UA: NEGATIVE mg/dL
Ketones, ur: NEGATIVE mg/dL
Nitrite: NEGATIVE
Protein, ur: NEGATIVE mg/dL
Specific Gravity, Urine: 1.02 (ref 1.005–1.030)
Urobilinogen, UA: 0.2 mg/dL (ref 0.0–1.0)
pH: 6 (ref 5.0–8.0)

## 2014-04-10 LAB — CBC WITH DIFFERENTIAL/PLATELET
Basophils Absolute: 0 10*3/uL (ref 0.0–0.1)
Basophils Relative: 1 % (ref 0–1)
Eosinophils Absolute: 0.2 10*3/uL (ref 0.0–0.7)
Eosinophils Relative: 4 % (ref 0–5)
HCT: 36.3 % (ref 36.0–46.0)
Hemoglobin: 11.5 g/dL — ABNORMAL LOW (ref 12.0–15.0)
Lymphocytes Relative: 29 % (ref 12–46)
Lymphs Abs: 1.7 10*3/uL (ref 0.7–4.0)
MCH: 28.3 pg (ref 26.0–34.0)
MCHC: 31.7 g/dL (ref 30.0–36.0)
MCV: 89.2 fL (ref 78.0–100.0)
Monocytes Absolute: 0.6 10*3/uL (ref 0.1–1.0)
Monocytes Relative: 10 % (ref 3–12)
Neutro Abs: 3.3 10*3/uL (ref 1.7–7.7)
Neutrophils Relative %: 56 % (ref 43–77)
Platelets: 252 10*3/uL (ref 150–400)
RBC: 4.07 MIL/uL (ref 3.87–5.11)
RDW: 14.8 % (ref 11.5–15.5)
WBC: 5.9 10*3/uL (ref 4.0–10.5)

## 2014-04-10 LAB — URINE MICROSCOPIC-ADD ON

## 2014-04-10 LAB — BASIC METABOLIC PANEL
BUN: 12 mg/dL (ref 6–23)
CO2: 30 mEq/L (ref 19–32)
Calcium: 9.2 mg/dL (ref 8.4–10.5)
Chloride: 102 mEq/L (ref 96–112)
Creatinine, Ser: 0.86 mg/dL (ref 0.50–1.10)
GFR calc Af Amer: 88 mL/min — ABNORMAL LOW (ref >90)
GFR calc non Af Amer: 76 mL/min — ABNORMAL LOW (ref >90)
Glucose, Bld: 110 mg/dL — ABNORMAL HIGH (ref 70–99)
Potassium: 3.8 mEq/L (ref 3.7–5.3)
Sodium: 141 mEq/L (ref 137–147)

## 2014-04-10 LAB — TROPONIN I: Troponin I: <0.3 ng/mL (ref <0.30)

## 2014-04-10 LAB — PRO B NATRIURETIC PEPTIDE: Pro B Natriuretic peptide (BNP): 592.5 pg/mL — ABNORMAL HIGH (ref 0–125)

## 2014-04-10 MED ORDER — POTASSIUM CHLORIDE CRYS ER 20 MEQ PO TBCR: 20.0000 meq | EXTENDED_RELEASE_TABLET | Freq: Every day | ORAL | Status: DC

## 2014-04-10 MED ORDER — FUROSEMIDE 40 MG PO TABS: 40.0000 mg | ORAL_TABLET | Freq: Every day | ORAL | Status: DC

## 2014-04-10 NOTE — ED Notes (Signed)
Patient c/o shortness of breath that started yesterday. Per patient occasional nonproductive cough,. Patient reports some intermittent non-radiating left side chest pain and fatigue. Per patient admitted 3 weeks ago for CHF.

## 2014-04-10 NOTE — Discharge Instructions (Signed)
Take your Lasix 40 mg twice a day for 3 days, and the potassium twice a day for 3 days; then resume her daily doses of Lasix 40 mg, and potassium 20 mEq each day.    Heart Failure Heart failure is a condition in which the heart has trouble pumping blood. This means your heart does not pump blood efficiently for your body to work well. In some cases of heart failure, fluid may back up into your lungs or you may have swelling (edema) in your lower legs. Heart failure is usually a long-term (chronic) condition. It is important for you to take good care of yourself and follow your caregiver's treatment plan. CAUSES  Some health conditions can cause heart failure. Those health conditions include:  High blood pressure (hypertension) causes the heart muscle to work harder than normal. When pressure in the blood vessels is high, the heart needs to pump (contract) with more force in order to circulate blood throughout the body. High blood pressure eventually causes the heart to become stiff and weak.  Coronary artery disease (CAD) is the buildup of cholesterol and fat (plaque) in the arteries of the heart. The blockage in the arteries deprives the heart muscle of oxygen and blood. This can cause chest pain and may lead to a heart attack. High blood pressure can also contribute to CAD.  Heart attack (myocardial infarction) occurs when 1 or more arteries in the heart become blocked. The loss of oxygen damages the muscle tissue of the heart. When this happens, part of the heart muscle dies. The injured tissue does not contract as well and weakens the heart's ability to pump blood.  Abnormal heart valves can cause heart failure when the heart valves do not open and close properly. This makes the heart muscle pump harder to keep the blood flowing.  Heart muscle disease (cardiomyopathy or myocarditis) is damage to the heart muscle from a variety of causes. These can include drug or alcohol abuse, infections, or  unknown reasons. These can increase the risk of heart failure.  Lung disease makes the heart work harder because the lungs do not work properly. This can cause a strain on the heart, leading it to fail.  Diabetes increases the risk of heart failure. High blood sugar contributes to high fat (lipid) levels in the blood. Diabetes can also cause slow damage to tiny blood vessels that carry important nutrients to the heart muscle. When the heart does not get enough oxygen and food, it can cause the heart to become weak and stiff. This leads to a heart that does not contract efficiently.  Other conditions can contribute to heart failure. These include abnormal heart rhythms, thyroid problems, and low blood counts (anemia). Certain unhealthy behaviors can increase the risk of heart failure. Those unhealthy behaviors include:  Being overweight.  Smoking or chewing tobacco.  Eating foods high in fat and cholesterol.  Abusing illicit drugs or alcohol.  Lacking physical activity. SYMPTOMS  Heart failure symptoms may vary and can be hard to detect. Symptoms may include:  Shortness of breath with activity, such as climbing stairs.  Persistent cough.  Swelling of the feet, ankles, legs, or abdomen.  Unexplained weight gain.  Difficulty breathing when lying flat (orthopnea).  Waking from sleep because of the need to sit up and get more air.  Rapid heartbeat.  Fatigue and loss of energy.  Feeling lightheaded, dizzy, or close to fainting.  Loss of appetite.  Nausea.  Increased urination during the night (  nocturia). DIAGNOSIS  A diagnosis of heart failure is based on your history, symptoms, physical examination, and diagnostic tests. Diagnostic tests for heart failure may include:  Echocardiography.  Electrocardiography.  Chest X-ray.  Blood tests.  Exercise stress test.  Cardiac angiography.  Radionuclide scans. TREATMENT  Treatment is aimed at managing the symptoms of  heart failure. Medicines, behavioral changes, or surgical intervention may be necessary to treat heart failure.  Medicines to help treat heart failure may include:  Angiotensin-converting enzyme (ACE) inhibitors. This type of medicine blocks the effects of a blood protein called angiotensin-converting enzyme. ACE inhibitors relax (dilate) the blood vessels and help lower blood pressure.  Angiotensin receptor blockers. This type of medicine blocks the actions of a blood protein called angiotensin. Angiotensin receptor blockers dilate the blood vessels and help lower blood pressure.  Water pills (diuretics). Diuretics cause the kidneys to remove salt and water from the blood. The extra fluid is removed through urination. This loss of extra fluid lowers the volume of blood the heart pumps.  Beta blockers. These prevent the heart from beating too fast and improve heart muscle strength.  Digitalis. This increases the force of the heartbeat.  Healthy behavior changes include:  Obtaining and maintaining a healthy weight.  Stopping smoking or chewing tobacco.  Eating heart healthy foods.  Limiting or avoiding alcohol.  Stopping illicit drug use.  Physical activity as directed by your caregiver.  Surgical treatment for heart failure may include:  A procedure to open blocked arteries, repair damaged heart valves, or remove damaged heart muscle tissue.  A pacemaker to improve heart muscle function and control certain abnormal heart rhythms.  An internal cardioverter defibrillator to treat certain serious abnormal heart rhythms.  A left ventricular assist device to assist the pumping ability of the heart. HOME CARE INSTRUCTIONS   Take your medicine as directed by your caregiver. Medicines are important in reducing the workload of your heart, slowing the progression of heart failure, and improving your symptoms.  Do not stop taking your medicine unless directed by your caregiver.  Do  not skip any dose of medicine.  Refill your prescriptions before you run out of medicine. Your medicines are needed every day.  Take over-the-counter medicine only as directed by your caregiver or pharmacist.  Engage in moderate physical activity if directed by your caregiver. Moderate physical activity can benefit some people. The elderly and people with severe heart failure should consult with a caregiver for physical activity recommendations.  Eat heart healthy foods. Food choices should be free of trans fat and low in saturated fat, cholesterol, and salt (sodium). Healthy choices include fresh or frozen fruits and vegetables, fish, lean meats, legumes, fat-free or low-fat dairy products, and whole grain or high fiber foods. Talk to a dietitian to learn more about heart healthy foods.  Limit sodium if directed by your caregiver. Sodium restriction may reduce symptoms of heart failure in some people. Talk to a dietitian to learn more about heart healthy seasonings.  Use healthy cooking methods. Healthy cooking methods include roasting, grilling, broiling, baking, poaching, steaming, or stir-frying. Talk to a dietitian to learn more about healthy cooking methods.  Limit fluids if directed by your caregiver. Fluid restriction may reduce symptoms of heart failure in some people.  Weigh yourself every day. Daily weights are important in the early recognition of excess fluid. You should weigh yourself every morning after you urinate and before you eat breakfast. Wear the same amount of clothing each time you weigh  yourself. Record your daily weight. Provide your caregiver with your weight record.  Monitor and record your blood pressure if directed by your caregiver.  Check your pulse if directed by your caregiver.  Lose weight if directed by your caregiver. Weight loss may reduce symptoms of heart failure in some people.  Stop smoking or chewing tobacco. Nicotine makes your heart work harder by  causing your blood vessels to constrict. Do not use nicotine gum or patches before talking to your caregiver.  Schedule and attend follow-up visits as directed by your caregiver. It is important to keep all your appointments.  Limit alcohol intake to no more than 1 drink per day for nonpregnant women and 2 drinks per day for men. Drinking more than that is harmful to your heart. Tell your caregiver if you drink alcohol several times a week. Talk with your caregiver about whether alcohol is safe for you. If your heart has already been damaged by alcohol or you have severe heart failure, drinking alcohol should be stopped completely.  Stop illicit drug use.  Stay up-to-date with immunizations. It is especially important to prevent respiratory infections through current pneumococcal and influenza immunizations.  Manage other health conditions such as hypertension, diabetes, thyroid disease, or abnormal heart rhythms as directed by your caregiver.  Learn to manage stress.  Plan rest periods when fatigued.  Learn strategies to manage high temperatures. If the weather is extremely hot:  Avoid vigorous physical activity.  Use air conditioning or fans or seek a cooler location.  Avoid caffeine and alcohol.  Wear loose-fitting, lightweight, and light-colored clothing.  Learn strategies to manage cold temperatures. If the weather is extremely cold:  Avoid vigorous physical activity.  Layer clothes.  Wear mittens or gloves, a hat, and a scarf when going outside.  Avoid alcohol.  Obtain ongoing education and support as needed.  Participate or seek rehabilitation as needed to maintain or improve independence and quality of life. SEEK MEDICAL CARE IF:   Your weight increases by 03 lb/1.4 kg in 1 day or 05 lb/2.3 kg in a week.  You have increasing shortness of breath that is unusual for you.  You are unable to participate in your usual physical activities.  You tire easily.  You  cough more than normal, especially with physical activity.  You have any or more swelling in areas such as your hands, feet, ankles, or abdomen.  You are unable to sleep because it is hard to breathe.  You feel like your heart is beating fast (palpitations).  You become dizzy or lightheaded upon standing up. SEEK IMMEDIATE MEDICAL CARE IF:   You have difficulty breathing.  There is a change in mental status such as decreased alertness or difficulty with concentration.  You have a pain or discomfort in your chest.  You have an episode of fainting (syncope). MAKE SURE YOU:   Understand these instructions.  Will watch your condition.  Will get help right away if you are not doing well or get worse. Document Released: 12/10/2005 Document Revised: 04/06/2013 Document Reviewed: 01/01/2013 [facility] Patient Information 2014 [place], [place].

## 2014-04-10 NOTE — ED Provider Notes (Signed)
CSN: [id number]     Arrival date & time 04/10/14  [id number] History  This chart was scribed for Kari Blade, MD by Jenne Campus, ED Scribe. This patient was seen in room APA07/APA07 and the patient's care was started at 9:16 AM.   Chief Complaint  Patient presents with  . Shortness of Breath     The history is provided by the patient. No language interpreter was used.    HPI Comments: Kari [name] is a 53 y.o. female who presents to the Emergency Department complaining of constant SOB with associated fatigue and mild NP cough that started last night. She reports one episode of CP described as tightness that occured this morning. She reports that she was admitted 3 weeks ago for CHF and followed up with her Cardiologist, Dr. Harrington Challenger, 2 weeks ago in office. She states that her lasix dose was increased from 20 mg to 40 mg daily and was given a prescription for an albuterol inhaler. She reports trying her albuterol inhaler with the CP resolving along with improvement in the SOB. She admits that she started using the albuterol inhaler yesterday after having the prescription filled several weeks ago stating that she hadn't thought to use it. She denies any missed doses of her medications but admits that she has not been keeping track of her weight at home. She has been eating and drinking normally since returning home. She states that she took the bus to arrive the the ED and only encountered mild trouble ambulating secondary to her  SOB. She any other symptoms currently and denies any h/o past or current smoking.  Past Medical History  Diagnosis Date  . Hypertension   . Depression   . Asthma   . Ulcer     left posterior calf  . Headache(784.0)   . Hyperlipidemia   . Lung nodules   . Sarcoidosis   . CHF (congestive heart failure) 03/12/2014  . Renal disorder    Past Surgical History  Procedure Laterality Date  . No past surgeries    . Cataract surgery Left 07-2013   Family History   Problem Relation Age of Onset  . Heart failure Mother   . Heart disease Father    History  Substance Use Topics  . Smoking status: Never Smoker   . Smokeless tobacco: Never Used  . Alcohol Use: No   No OB history provided.  Review of Systems  Constitutional: Negative for unexpected weight change.  Respiratory: Positive for cough and shortness of breath.   Cardiovascular: Positive for chest pain (resolved ).  All other systems reviewed and are negative.   Allergies  Review of patient's allergies indicates no known allergies.  Home Medications   Prior to Admission medications   Medication Sig Start Date End Date Taking? Authorizing Provider  albuterol (PROVENTIL HFA;VENTOLIN HFA) 108 (90 BASE) MCG/ACT inhaler Inhale 2 puffs into the lungs every 6 (six) hours as needed for shortness of breath.    Historical Provider, MD  amLODipine (NORVASC) 5 MG tablet Take 5 mg by mouth every morning.     Historical Provider, MD  aspirin EC 81 MG tablet Take 81 mg by mouth daily.    Historical Provider, MD  furosemide (LASIX) 40 MG tablet Take 1 tablet (40 mg total) by mouth daily. 03/26/14   Fay Records, MD  potassium chloride SA (K-DUR,KLOR-CON) 20 MEQ tablet Take 1 tablet (20 mEq total) by mouth daily. 03/15/14   Samuella Cota, MD  Triage Vitals: BP 172/89  Pulse 88  Temp(Src) 98.2 F (36.8 C) (Oral)  Resp 24  Ht 5\' 7"  (1.702 m)  Wt 317 lb (143.79 kg)  BMI 49.64 kg/m2  SpO2 98%  LMP 11/21/2011  Physical Exam  Nursing note and vitals reviewed. Constitutional: She is oriented to person, place, and time. She appears well-developed and well-nourished. No distress.  HENT:  Head: Normocephalic and atraumatic.  Eyes: Conjunctivae and EOM are normal.  Neck: Neck supple. No tracheal deviation present.  Cardiovascular: Normal rate and regular rhythm.   Pulmonary/Chest: Effort normal. No respiratory distress. She has decreased breath sounds.  mildly decreased air movement, no wheezes,  rales or rhonchi, no distress  Abdominal: Soft. She exhibits no distension.  Musculoskeletal: Normal range of motion. She exhibits edema.  3+ pitting edema in BLE, healing wound to right anterior shin, no sign of infection  Neurological: She is alert and oriented to person, place, and time. No sensory deficit.  Skin: Skin is dry.  Psychiatric: She has a normal mood and affect. Her behavior is normal.    ED Course  Procedures (including critical care time)  DIAGNOSTIC STUDIES: Oxygen Saturation is 98% on RA, normal by my interpretation.    COORDINATION OF CARE: 9:20 AM-Discussed treatment plan which includes CXR, CBC panel, BMP and UA with pt at bedside and pt agreed to plan.   11:15 AM-Informed pt of radiology and lab work results. Discussed discharge plan which includes taking 2 potassium pills and 80 mg Lasix for the next 3 days with pt and pt agreed to plan. Also advised pt to follow up as needed and pt agreed. Addressed symptoms to return for with pt.   Nursing Notes Reviewed/ Care Coordinated Applicable Imaging Reviewed Interpretation of Laboratory Data incorporated into ED treatment  Labs Review Labs Reviewed  CBC WITH DIFFERENTIAL - Abnormal; Notable for the following:    Hemoglobin 11.5 (*)    All other components within normal limits  BASIC METABOLIC PANEL - Abnormal; Notable for the following:    Glucose, Bld 110 (*)    GFR calc non Af Amer 76 (*)    GFR calc Af Amer 88 (*)    All other components within normal limits  PRO B NATRIURETIC PEPTIDE - Abnormal; Notable for the following:    Pro B Natriuretic peptide (BNP) 592.5 (*)    All other components within normal limits  URINALYSIS, ROUTINE W REFLEX MICROSCOPIC - Abnormal; Notable for the following:    APPearance HAZY (*)    Hgb urine dipstick TRACE (*)    Leukocytes, UA LARGE (*)    All other components within normal limits  URINE MICROSCOPIC-ADD ON - Abnormal; Notable for the following:    Squamous Epithelial /  LPF MANY (*)    Bacteria, UA MANY (*)    All other components within normal limits  URINE CULTURE  TROPONIN I    Imaging Review Dg Chest Port 1 View  04/10/2014   CLINICAL DATA:  53 year old female with shortness of breath. History of sarcoidosis. Initial encounter.  EXAM: PORTABLE CHEST - 1 VIEW  COMPARISON:  03/13/2014 and earlier.  FINDINGS: Portable AP upright view at 0930 hrs. Stable cardiomegaly and mediastinal contours. Stable lung volumes. No pneumothorax. No pleural effusion. Increased interstitial markings bilaterally. There may be trace fluid in the minor fissure. Increased retrocardiac opacity. Visualized tracheal air column is within normal limits.  IMPRESSION: Chronic cardiomegaly with interval increased interstitial markings and trace fluid in the right minor fissure.  Favor pulmonary interstitial edema. Viral/ atypical respiratory infection, or progression of pulmonary sarcoid are less likely.   Electronically Signed   By: Lars Pinks M.D.   On: 04/10/2014 09:53     EKG Interpretation   Date/Time:  Saturday April 10 2014 08:56:37 EDT Ventricular Rate:  88 PR Interval:  172 QRS Duration: 90 QT Interval:  388 QTC Calculation: 469 R Axis:   35 Text Interpretation:  Sinus rhythm with occasional Premature ventricular  complexes Cannot rule out Anterior infarct (cited on or before  12-Mar-2014) Abnormal ECG When compared with ECG of 12-Mar-2014 15:59,  Fusion complexes are no longer Present Premature ventricular complexes are  now Present Confirmed by Eulis Foster  MD, Vira Agar [phone]) on 04/10/2014 1:40:28  PM      MDM   Final diagnoses:  CHF (congestive heart failure)   Nursing Notes Reviewed/ Care Coordinated Applicable Imaging Reviewed Interpretation of Laboratory Data incorporated into ED treatment  The patient appears reasonably screened and/or stabilized for discharge and I doubt any other medical condition or other [facility] requiring further screening, evaluation, or  treatment in the ED at this time prior to discharge.  Plan: Home Medications- increase Lasix and potassium for 3 days; Home Treatments- rest, and elevated legs; return here if the recommended treatment, does not improve the symptoms; Recommended follow up- cardiology for checkup in 4 days      I personally performed the services described in this documentation, which was scribed in my presence. The recorded information has been reviewed and is accurate.      Kari Blade, MD 04/10/14 [phone]

## 2014-04-10 NOTE — ED Notes (Signed)
Pt c/o SOB and fatigue x1-2 days. Pt has hx of CHF and states she was admitted into the hospital 3-4 weeks ago due to her CHF. Pt currently denies chest pain, nausea, diaphoresis or any other symptoms. Pt states she used albuterol inhaler at home with minimal relief. Pt also reports "a little bit" of bilateral leg swelling since symptoms began.

## 2014-04-11 LAB — URINE CULTURE: Colony Count: 55000

## 2014-04-22 NOTE — Progress Notes (Signed)
ERROR

## 2014-04-23 ENCOUNTER — Encounter: Payer: Self-pay | Admitting: *Deleted

## 2014-04-23 ENCOUNTER — Encounter: Payer: Medicaid Other | Admitting: Adult Health

## 2014-05-27 ENCOUNTER — Encounter: Payer: Self-pay | Admitting: *Deleted

## 2014-06-03 ENCOUNTER — Encounter: Payer: Self-pay | Admitting: Adult Health

## 2014-06-03 ENCOUNTER — Ambulatory Visit (INDEPENDENT_AMBULATORY_CARE_PROVIDER_SITE_OTHER): Payer: Medicaid Other | Admitting: Adult Health

## 2014-06-03 VITALS — BP 122/72 | HR 79 | Ht 67.0 in | Wt 319.0 lb

## 2014-06-03 DIAGNOSIS — I509 Heart failure, unspecified: Secondary | ICD-10-CM

## 2014-06-03 DIAGNOSIS — D869 Sarcoidosis, unspecified: Secondary | ICD-10-CM

## 2014-06-03 DIAGNOSIS — I10 Essential (primary) hypertension: Secondary | ICD-10-CM

## 2014-06-03 NOTE — Progress Notes (Signed)
HPI Mr. Kari [name] is a 53 year old patient of Dr. Harrington Challenger for follow for ongoing assessment and management for diastolic CHF, moderate MR, with a history of chest pain with normal stress Myoview in 2013. The patient had a recent admission at [facility] in April of 2015 FOR, decompensated CHF.  Most recent echocardiogram in April of 2015 revealed an LVEF of 50% to 55% with normal wall motion. The patient had grade 1 diastolic dysfunction. Mitral valve revealed moderate regurgitation, aortic valve trivial regurgitation, left atrium was moderately dilated, RV size was mildly dilated, the atrium was mildly dilated, pulmonary artery systolic pressure was mildly increased with a PA pressure of 36 mm of mercury. There was a linear oscillating density in the left atrium which may be a ruptured chordae.  On the last visit the patient was recommended to increase his Lasix to 40 mg a day. He continued to have recurrent shortness of breath with ongoing diuretic treatment, consideration for stress test was recommended.  The patient comes today with no substantial weight gain. She continues to have some mild shortness of breath. She admits to eating potato chips and pork rinds at home. She states she has a very much, but family member who is with her states she is more than she is saying. She is medically compliant concerning her Lasix. There is no substantial fluid retention that she is noticing.  No Known Allergies  Current Outpatient Prescriptions  Medication Sig Dispense Refill  . albuterol (PROVENTIL HFA;VENTOLIN HFA) 108 (90 BASE) MCG/ACT inhaler Inhale 2 puffs into the lungs every 6 (six) hours as needed for shortness of breath.      Marland Kitchen amLODipine (NORVASC) 5 MG tablet Take 5 mg by mouth every morning.       Marland Kitchen aspirin EC 81 MG tablet Take 81 mg by mouth daily.      . furosemide (LASIX) 40 MG tablet Take 1 tablet (40 mg total) by mouth daily.  60 tablet  0  . ibuprofen (ADVIL,MOTRIN) 800 MG tablet  Take 800 mg by mouth every 6 (six) hours as needed for moderate pain.      . potassium chloride SA (K-DUR,KLOR-CON) 20 MEQ tablet Take 1 tablet (20 mEq total) by mouth daily.  30 tablet  0   No current facility-administered medications for this visit.    Past Medical History  Diagnosis Date  . Hypertension   . Depression   . Asthma   . Ulcer     left posterior calf  . Headache(784.0)   . Hyperlipidemia   . Lung nodules   . Sarcoidosis   . CHF (congestive heart failure) 03/12/2014  . Renal disorder     Past Surgical History  Procedure Laterality Date  . No past surgeries    . Cataract surgery Left 07-2013    ROS: Review of systems complete and found to be negative unless listed above PHYSICAL EXAM BP 122/72  Pulse 79  Ht 5\' 7"  (1.702 m)  Wt 319 lb (144.697 kg)  BMI 49.95 kg/m2  SpO2 99%  LMP 11/21/2011 General: Well developed, well nourished, in no acute distress, morbidly obese Head: Eyes PERRLA, No xanthomas.   Normal cephalic and atramatic  Lungs: Clear bilaterally to auscultation and percussion. Heart: HRRR S1 S2 distant heart sounds,, without MRG.  Pulses are 2+ & equal.            No carotid bruit. No JVD.  No abdominal bruits. No femoral bruits. Abdomen: Bowel sounds are positive,  abdomen soft and non-tender without masses or                  Hernia's noted. Msk:  Back normal, normal gait. Normal strength and tone for age. Extremities: No clubbing, cyanosis or venous stasis skin thickening bilaterally with healing skin ulcer to the right tibial area with mild edema.  DP +1 Neuro: Alert and oriented X 3. Psych:  Good affect, responds appropriately     ASSESSMENT AND PLAN

## 2014-06-03 NOTE — Patient Instructions (Signed)
Your physician recommends that you schedule a follow-up appointment in: 1 month with K.Lawrence NP     Your physician recommends that you continue on your current medications as directed. Please refer to the Current Medication list given to you today.  Please purchase scale and dehumidifier  Please weigh self daily at the same time,in the morning without clothes after you empty your bladder.record weight on log we will provide for you.Please bring log to next appt.   Please observe low sodium diet pamphlet we provided

## 2014-06-03 NOTE — Progress Notes (Deleted)
Name: Kari [name]    DOB: 1961/05/04  Age: 53 y.o.  MR#: [id number]       PCP:  Maggie Font, MD      Insurance: Payor: MEDICAID [place] / Plan: MEDICAID [place] ACCESS / Product Type: *No Product type* /   CC:    Chief Complaint  Patient presents with  . Hypertension  . Congestive Heart Failure    VS Filed Vitals:   06/03/14 1401  BP: 122/72  Pulse: 79  Height: 5\' 7"  (1.702 m)  Weight: 319 lb (144.697 kg)  SpO2: 99%    Weights Current Weight  06/03/14 319 lb (144.697 kg)  04/10/14 317 lb (143.79 kg)  03/26/14 317 lb (143.79 kg)    Blood Pressure  BP Readings from Last 3 Encounters:  06/03/14 122/72  04/10/14 141/94  03/26/14 141/86     Admit date:  (Not on file) Last encounter with RMR:  Visit date not found   Allergy Review of patient's allergies indicates no known allergies.  Current Outpatient Prescriptions  Medication Sig Dispense Refill  . albuterol (PROVENTIL HFA;VENTOLIN HFA) 108 (90 BASE) MCG/ACT inhaler Inhale 2 puffs into the lungs every 6 (six) hours as needed for shortness of breath.      Marland Kitchen amLODipine (NORVASC) 5 MG tablet Take 5 mg by mouth every morning.       Marland Kitchen aspirin EC 81 MG tablet Take 81 mg by mouth daily.      . furosemide (LASIX) 40 MG tablet Take 1 tablet (40 mg total) by mouth daily.  60 tablet  0  . ibuprofen (ADVIL,MOTRIN) 800 MG tablet Take 800 mg by mouth every 6 (six) hours as needed for moderate pain.      . potassium chloride SA (K-DUR,KLOR-CON) 20 MEQ tablet Take 1 tablet (20 mEq total) by mouth daily.  30 tablet  0   No current facility-administered medications for this visit.    Discontinued Meds:   There are no discontinued medications.  Patient Active Problem List   Diagnosis Date Noted  . Acute diastolic CHF (congestive heart failure) 03/14/2014  . CHF (congestive heart failure) 03/12/2014  . Sarcoidosis 03/12/2014  . Severe sepsis 10/17/2013  . ARF (acute renal failure) 10/08/2013  . Cellulitis and abscess 10/08/2013   . Obesity 10/08/2013  . Volume depletion 10/08/2013  . Unspecified venous (peripheral) insufficiency 10/28/2012  . Mediastinal lymphadenopathy 10/16/2012  . Pulmonary nodules 10/16/2012  . 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 Chest pain 09/02/2012  . Asthma   . Hypertension   . Depression   . Venous stasis ulcers 07/29/2012  . Varicose veins of lower extremities with ulcer 07/08/2012  . Venous ulcer of leg 07/08/2012    LABS    Component Value Date/Time   NA 141 04/10/2014 0923   NA 140 03/31/2014 1010   NA 140 03/15/2014 0523   K 3.8 04/10/2014 0923   K 3.7 03/31/2014 1010   K 4.2 03/15/2014 0523   CL 102 04/10/2014 0923   CL 102 03/31/2014 1010   CL 100 03/15/2014 0523   CO2 30 04/10/2014 0923   CO2 30 03/31/2014 1010   CO2 34* 03/15/2014 0523   GLUCOSE 110* 04/10/2014 0923   GLUCOSE 97 03/31/2014 1010   GLUCOSE 109* 03/15/2014 0523   BUN 12 04/10/2014 0923   BUN 11 03/31/2014 1010   BUN 19 03/15/2014 0523   CREATININE 0.86 04/10/2014 0923   CREATININE 0.83 03/31/2014 1010   CREATININE  0.91 03/15/2014 0523   CREATININE 0.89 03/14/2014 0545   CALCIUM 9.2 04/10/2014 0923   CALCIUM 9.2 03/31/2014 1010   CALCIUM 9.4 03/15/2014 0523   GFRNONAA 76* 04/10/2014 0923   GFRNONAA 71* 03/15/2014 0523   GFRNONAA 73* 03/14/2014 0545   GFRAA 88* 04/10/2014 0923   GFRAA 83* 03/15/2014 0523   GFRAA 85* 03/14/2014 0545   CMP     Component Value Date/Time   NA 141 04/10/2014 0923   K 3.8 04/10/2014 0923   CL 102 04/10/2014 0923   CO2 30 04/10/2014 0923   GLUCOSE 110* 04/10/2014 0923   BUN 12 04/10/2014 0923   CREATININE 0.86 04/10/2014 0923   CREATININE 0.83 03/31/2014 1010   CALCIUM 9.2 04/10/2014 0923   PROT 8.2 03/13/2014 0638   ALBUMIN 3.3* 03/13/2014 0638   AST 12 03/13/2014 0638   ALT 7 03/13/2014 0638   ALKPHOS 57 03/13/2014 0638   BILITOT 0.5 03/13/2014 0638   GFRNONAA 76* 04/10/2014 0923   GFRAA 88* 04/10/2014 0923       Component Value Date/Time    WBC 5.9 04/10/2014 0923   WBC 9.8 03/13/2014 0638   WBC 9.3 03/12/2014 1857   HGB 11.5* 04/10/2014 0923   HGB 11.8* 03/13/2014 0638   HGB 11.5* 03/12/2014 1857   HCT 36.3 04/10/2014 0923   HCT 37.1 03/13/2014 0638   HCT 36.4 03/12/2014 1857   MCV 89.2 04/10/2014 0923   MCV 87.5 03/13/2014 0638   MCV 87.9 03/12/2014 1857    Lipid Panel     Component Value Date/Time   CHOL 135 06/21/2012 0950   TRIG 59 06/21/2012 0950   HDL 54 06/21/2012 0950   CHOLHDL 2.5 06/21/2012 0950   VLDL 12 06/21/2012 0950   LDLCALC 69 06/21/2012 0950    ABG    Component Value Date/Time   TCO2 26 03/12/2014 1559     Lab Results  Component Value Date   TSH 0.625 03/13/2014   BNP (last 3 results)  Recent Labs  03/12/14 1543 03/13/14 0638 04/10/14 0923  PROBNP 1452.0* 2003.0* 592.5*   Cardiac Panel (last 3 results) No results found for this basename: CKTOTAL, CKMB, TROPONINI, RELINDX,  in the last 72 hours  Iron/TIBC/Ferritin No results found for this basename: iron, tibc, ferritin     EKG Orders placed during the hospital encounter of 04/10/14  . EKG 12-LEAD  . EKG 12-LEAD  . EKG     Prior Assessment and Plan Problem List as of 06/03/2014     Cardiovascular and Mediastinum   Varicose veins of lower extremities with ulcer   Hypertension   Last Assessment & Plan   01/05/2013 Office Visit Written 01/05/2013  2:57 PM by Lelon Perla, MD     Blood pressure controlled. Continue present medications.    Atherosclerosis of native arteries of the extremities with ulceration(440.23)   Mediastinal lymphadenopathy   Last Assessment & Plan   11/03/2013 Office Visit Written 11/03/2013 12:40 PM by [name], MD     The patient's lymphadenopathy has clearly decreased from her scan last year.  I suspect she does have sarcoidosis, but she is really asymptomatic from a pulmonary standpoint, and has not had any significant skin changes.  I see no reason at this point to aggressively pursue a biopsy, and she  certainly does not need treatment for possible sarcoid.  She is to call if her pulmonary status changes.    Unspecified venous (peripheral) insufficiency   CHF (congestive heart failure)  Acute diastolic CHF (congestive heart failure)     Respiratory   Asthma     Musculoskeletal and Integument   Venous ulcer of leg   Venous stasis ulcers   Last Assessment & Plan   01/05/2013 Office Visit Written 01/05/2013  2:57 PM by Lelon Perla, MD     Management per vascular surgery.    Cellulitis and abscess     Genitourinary   ARF (acute renal failure)     Other   Depression   Chest pain   Last Assessment & Plan   01/05/2013 Office Visit Written 01/05/2013  2:56 PM by Lelon Perla, MD     Symptoms were atypical in September. Workup including CTA, myoview and echocardiogram unrevealing. She has had no chest pain since then. I do not think further cardiac workup is indicated.    Pulmonary nodules   Last Assessment & Plan   11/03/2013 Office Visit Written 11/03/2013 12:37 PM by [name], MD     The patient's pulmonary nodules are completely stable from her CT scan last year.  She will need one more scan in a year, and is unchanged, and no further followup is needed.  More than likely this is related to sarcoid.    Obesity   Volume depletion   Severe sepsis   Sarcoidosis       Imaging: No results found.

## 2014-06-03 NOTE — Assessment & Plan Note (Signed)
Excellent control of blood pressure currently. She is on amlodipine 5 mg daily along with diuretic and potassium replacement. She is advised to avoid salt, as this will also substantially increase her blood pressure. She verbalized understanding

## 2014-06-03 NOTE — Assessment & Plan Note (Signed)
I had a lengthy discussion with the patient concerning her CHF. She does not have a scale at home so we talked about the need to weigh herself daily. I have given her a weight recording sheet. I have also given her a copy of a low sodium diet. She is not to eat potato chips or pork rinds. And she is to avoid all processed meats. If she gains 3-4 pounds in 24 hours or 5 pounds in a week, she is to call his. She may need to have an additional dose of Lasix at that time. I explained to her the necessity of keeping her fluid down, and avoiding rehospitalization as this will significantly increase her chances physical infections as well as recurrence of CHF. She verbalizes understanding.

## 2014-06-03 NOTE — Assessment & Plan Note (Signed)
She has chronic dyspnea on exertion associated with sarcoidosis. She is finding it hard to breathe and now in good weather. I have suggested that she buy a dehumidifier and keep it in her home to assist with the extra moisture in the air causing her to breathe harder. She verbalizes understanding

## 2014-06-26 DIAGNOSIS — I219 Acute myocardial infarction, unspecified: Secondary | ICD-10-CM

## 2014-06-26 HISTORY — DX: Acute myocardial infarction, unspecified: I21.9

## 2014-07-05 ENCOUNTER — Encounter: Payer: Medicaid Other | Admitting: Adult Health

## 2014-07-05 ENCOUNTER — Encounter: Payer: Self-pay | Admitting: *Deleted

## 2014-07-05 ENCOUNTER — Encounter: Payer: Self-pay | Admitting: Adult Health

## 2014-07-05 NOTE — Progress Notes (Signed)
No show

## 2014-08-23 ENCOUNTER — Encounter: Payer: Self-pay | Admitting: *Deleted

## 2014-10-11 ENCOUNTER — Other Ambulatory Visit: Payer: Medicaid Other

## 2014-12-21 ENCOUNTER — Encounter: Payer: Self-pay | Admitting: *Deleted

## 2015-02-02 ENCOUNTER — Telehealth: Payer: Self-pay | Admitting: Pulmonary Disease

## 2015-02-02 NOTE — Telephone Encounter (Signed)
Mindy, please let pt know that she is overdue for her f/u chest ct.  If she is ok with this, will order.

## 2015-02-08 NOTE — Telephone Encounter (Signed)
ATC PT was not able to hear her at all. WCB

## 2015-02-09 NOTE — Telephone Encounter (Signed)
[email or web address] x1 

## 2015-02-11 NOTE — Telephone Encounter (Signed)
Called spoke with daughter. She reports pt is not available and will need to speak with her before she agree's to anything. She is going to call me back once she speaks with patient.

## 2015-02-14 NOTE — Telephone Encounter (Signed)
I called and spoke with Kari [name]. She reports the reason she has not called back is because she does not have any insurance currently. She reports once she gets insurance she will call us to get CT scheduled. Will make [facility] aware.

## 2015-03-11 ENCOUNTER — Encounter (HOSPITAL_COMMUNITY): Payer: Self-pay | Admitting: *Deleted

## 2015-03-11 ENCOUNTER — Emergency Department (HOSPITAL_COMMUNITY)
Admission: EM | Admit: 2015-03-11 | Discharge: 2015-03-11 | Disposition: A | Discharge status: Home / Self Care | Payer: Medicaid Other | Attending: Emergency Medicine | Admitting: Emergency Medicine

## 2015-03-11 DIAGNOSIS — L97921 Non-pressure chronic ulcer of unspecified part of left lower leg limited to breakdown of skin: Secondary | ICD-10-CM

## 2015-03-11 DIAGNOSIS — I509 Heart failure, unspecified: Secondary | ICD-10-CM | POA: Insufficient documentation

## 2015-03-11 DIAGNOSIS — Z862 Personal history of diseases of the blood and blood-forming organs and certain disorders involving the immune mechanism: Secondary | ICD-10-CM | POA: Insufficient documentation

## 2015-03-11 DIAGNOSIS — Z8659 Personal history of other mental and behavioral disorders: Secondary | ICD-10-CM | POA: Insufficient documentation

## 2015-03-11 DIAGNOSIS — Z79899 Other long term (current) drug therapy: Secondary | ICD-10-CM | POA: Insufficient documentation

## 2015-03-11 DIAGNOSIS — Z7982 Long term (current) use of aspirin: Secondary | ICD-10-CM | POA: Insufficient documentation

## 2015-03-11 DIAGNOSIS — I10 Essential (primary) hypertension: Secondary | ICD-10-CM | POA: Insufficient documentation

## 2015-03-11 DIAGNOSIS — J45909 Unspecified asthma, uncomplicated: Secondary | ICD-10-CM | POA: Insufficient documentation

## 2015-03-11 DIAGNOSIS — Z8639 Personal history of other endocrine, nutritional and metabolic disease: Secondary | ICD-10-CM | POA: Insufficient documentation

## 2015-03-11 DIAGNOSIS — Z87448 Personal history of other diseases of urinary system: Secondary | ICD-10-CM | POA: Insufficient documentation

## 2015-03-11 MED ORDER — SILVER SULFADIAZINE 1 % EX CREA
TOPICAL_CREAM | Freq: Every day | CUTANEOUS | Status: DC
  Administered 2015-03-11: 15:00:00 via TOPICAL
  Filled 2015-03-11: qty 50

## 2015-03-11 NOTE — ED Notes (Signed)
Leg ulcer on posterior L calf.  Has had ulcer for over a year, states she felt it draining last night. Denies fevers.

## 2015-03-11 NOTE — Discharge Instructions (Signed)
Silvadene ointment to wound. Follow-up at [facility] wound care center.

## 2015-03-11 NOTE — ED Provider Notes (Signed)
CSN: [id number]     Arrival date & time 03/11/15  1249 History  This chart was scribed for Nat Christen, MD by Edison Simon, ED Scribe. This patient was seen in room APA03/APA03 and the patient's care was started at 3:01 PM.    Chief Complaint  Patient presents with  . Pressure Ulcer   The history is provided by the patient. No language interpreter was used.    HPI Comments: Kari [name] is a 54 y.o. female who presents to the Emergency Department complaining of ulcer to her left leg that has been present for 1 year. She states they "healed up," but felt some drainage last night. She notes she was hospitalized October 16 for an infection. She states she has previously been treated with Silvadene, but no longer has any. She was previously seen at wound center at [facility]. She denies fever or other symptoms.   Past Medical History  Diagnosis Date  . Hypertension   . Depression   . Asthma   . Ulcer     left posterior calf  . Headache(784.0)   . Hyperlipidemia   . Lung nodules   . Sarcoidosis   . CHF (congestive heart failure) 03/12/2014  . Renal disorder    Past Surgical History  Procedure Laterality Date  . No past surgeries    . Cataract surgery Left 07-2013   Family History  Problem Relation Age of Onset  . Heart failure Mother   . Heart disease Father    History  Substance Use Topics  . Smoking status: Never Smoker   . Smokeless tobacco: Never Used  . Alcohol Use: No   OB History    No data available     Review of Systems A complete 10 system review of systems was obtained and all systems are negative except as noted in the HPI and PMH.    Allergies  Review of patient's allergies indicates no known allergies.  Home Medications   Prior to Admission medications   Medication Sig Start Date End Date Taking? Authorizing Provider  amLODipine (NORVASC) 5 MG tablet Take 5 mg by mouth every morning.    Yes Historical Provider, MD  aspirin EC 81 MG tablet Take 81  mg by mouth daily.   Yes Historical Provider, MD  furosemide (LASIX) 40 MG tablet Take 1 tablet (40 mg total) by mouth daily. 04/10/14  Yes Daleen Bo, MD  ibuprofen (ADVIL,MOTRIN) 800 MG tablet Take 800 mg by mouth every 6 (six) hours as needed for moderate pain.   Yes Historical Provider, MD  potassium chloride SA (K-DUR,KLOR-CON) 20 MEQ tablet Take 1 tablet (20 mEq total) by mouth daily. 04/10/14  Yes Daleen Bo, MD  albuterol (PROVENTIL HFA;VENTOLIN HFA) 108 (90 BASE) MCG/ACT inhaler Inhale 2 puffs into the lungs every 6 (six) hours as needed for shortness of breath.    Historical Provider, MD   BP 161/95 mmHg  Pulse 81  Temp(Src) 98.4 F (36.9 C) (Oral)  Resp 19  Ht 5\' 7"  (1.702 m)  Wt 300 lb (136.079 kg)  BMI 46.98 kg/m2  SpO2 100%  LMP 11/21/2011 Physical Exam  Constitutional: She is oriented to person, place, and time. She appears well-developed and well-nourished.  HENT:  Head: Normocephalic and atraumatic.  Eyes: Conjunctivae and EOM are normal. Pupils are equal, round, and reactive to light.  Neck: Normal range of motion. Neck supple.  Cardiovascular: Normal rate and regular rhythm.   Pulmonary/Chest: Effort normal and breath sounds normal.  Abdominal: Soft. Bowel sounds are normal.  Musculoskeletal: Normal range of motion.  Neurological: She is alert and oriented to person, place, and time.  Skin: Skin is warm and dry.  Left lower extremity on the distal lateral posterior calf, there is a 1.0 x 0.5cm ulceration   Psychiatric: She has a normal mood and affect. Her behavior is normal.  Nursing note and vitals reviewed.   ED Course  Procedures (including critical care time)  DIAGNOSTIC STUDIES: Oxygen Saturation is 100% on room air, normal by my interpretation.    COORDINATION OF CARE: 3:04 PM Discussed treatment plan with patient at beside, including treatment with Silvadene. The patient agrees with the plan and has no further questions at this time.   Labs  Review Labs Reviewed - No data to display  Imaging Review No results found.   EKG Interpretation None      MDM   Final diagnoses:  Ulcer of left lower leg, limited to breakdown of skin    Patient has minimal superficial ulcer on left lower extremity. No cellulitis. Rx Silvadene and simple dressing. Follow-up at [facility] wound care center.  I personally performed the services described in this documentation, which was scribed in my presence. The recorded information has been reviewed and is accurate.   Nat Christen, MD 03/11/15 [phone]

## 2015-08-03 ENCOUNTER — Emergency Department (HOSPITAL_COMMUNITY)
Admission: EM | Admit: 2015-08-03 | Discharge: 2015-08-03 | Disposition: A | Discharge status: Home / Self Care | Payer: Medicaid Other | Attending: Emergency Medicine | Admitting: Emergency Medicine

## 2015-08-03 ENCOUNTER — Encounter (HOSPITAL_COMMUNITY): Payer: Self-pay

## 2015-08-03 DIAGNOSIS — I10 Essential (primary) hypertension: Secondary | ICD-10-CM | POA: Insufficient documentation

## 2015-08-03 DIAGNOSIS — Z862 Personal history of diseases of the blood and blood-forming organs and certain disorders involving the immune mechanism: Secondary | ICD-10-CM | POA: Insufficient documentation

## 2015-08-03 DIAGNOSIS — Z79899 Other long term (current) drug therapy: Secondary | ICD-10-CM | POA: Insufficient documentation

## 2015-08-03 DIAGNOSIS — Z87448 Personal history of other diseases of urinary system: Secondary | ICD-10-CM | POA: Insufficient documentation

## 2015-08-03 DIAGNOSIS — J45909 Unspecified asthma, uncomplicated: Secondary | ICD-10-CM | POA: Insufficient documentation

## 2015-08-03 DIAGNOSIS — I83009 Varicose veins of unspecified lower extremity with ulcer of unspecified site: Secondary | ICD-10-CM

## 2015-08-03 DIAGNOSIS — L97909 Non-pressure chronic ulcer of unspecified part of unspecified lower leg with unspecified severity: Secondary | ICD-10-CM | POA: Insufficient documentation

## 2015-08-03 DIAGNOSIS — R600 Localized edema: Secondary | ICD-10-CM | POA: Insufficient documentation

## 2015-08-03 DIAGNOSIS — Z8639 Personal history of other endocrine, nutritional and metabolic disease: Secondary | ICD-10-CM | POA: Insufficient documentation

## 2015-08-03 DIAGNOSIS — I509 Heart failure, unspecified: Secondary | ICD-10-CM | POA: Insufficient documentation

## 2015-08-03 DIAGNOSIS — Z7982 Long term (current) use of aspirin: Secondary | ICD-10-CM | POA: Insufficient documentation

## 2015-08-03 LAB — CBC WITH DIFFERENTIAL/PLATELET
Basophils Absolute: 0 10*3/uL (ref 0.0–0.1)
Basophils Relative: 0 % (ref 0–1)
Eosinophils Absolute: 0.1 10*3/uL (ref 0.0–0.7)
Eosinophils Relative: 1 % (ref 0–5)
HCT: 41.3 % (ref 36.0–46.0)
Hemoglobin: 12.8 g/dL (ref 12.0–15.0)
Lymphocytes Relative: 21 % (ref 12–46)
Lymphs Abs: 1.5 10*3/uL (ref 0.7–4.0)
MCH: 28.8 pg (ref 26.0–34.0)
MCHC: 31 g/dL (ref 30.0–36.0)
MCV: 92.8 fL (ref 78.0–100.0)
Monocytes Absolute: 0.5 10*3/uL (ref 0.1–1.0)
Monocytes Relative: 7 % (ref 3–12)
Neutro Abs: 4.8 10*3/uL (ref 1.7–7.7)
Neutrophils Relative %: 71 % (ref 43–77)
Platelets: 271 10*3/uL (ref 150–400)
RBC: 4.45 MIL/uL (ref 3.87–5.11)
RDW: 14.5 % (ref 11.5–15.5)
WBC: 6.8 10*3/uL (ref 4.0–10.5)

## 2015-08-03 LAB — BASIC METABOLIC PANEL
Anion gap: 8 (ref 5–15)
BUN: 13 mg/dL (ref 6–20)
CO2: 31 mmol/L (ref 22–32)
Calcium: 9.6 mg/dL (ref 8.9–10.3)
Chloride: 102 mmol/L (ref 101–111)
Creatinine, Ser: 0.84 mg/dL (ref 0.44–1.00)
GFR calc Af Amer: >60 mL/min (ref >60)
GFR calc non Af Amer: >60 mL/min (ref >60)
Glucose, Bld: 113 mg/dL — ABNORMAL HIGH (ref 65–99)
Potassium: 3.3 mmol/L — ABNORMAL LOW (ref 3.5–5.1)
Sodium: 141 mmol/L (ref 135–145)

## 2015-08-03 MED ORDER — SULFAMETHOXAZOLE-TRIMETHOPRIM 800-160 MG PO TABS: 1.0000 | ORAL_TABLET | Freq: Two times a day (BID) | ORAL | Status: AC

## 2015-08-03 MED ORDER — SULFAMETHOXAZOLE-TRIMETHOPRIM 800-160 MG PO TABS
1.0000 | ORAL_TABLET | Freq: Once | ORAL | Status: AC
  Administered 2015-08-03: 1 via ORAL
  Filled 2015-08-03: qty 1

## 2015-08-03 MED ORDER — SILVER SULFADIAZINE 1 % EX CREA
TOPICAL_CREAM | Freq: Once | CUTANEOUS | Status: AC
  Administered 2015-08-03: 14:00:00 via TOPICAL
  Filled 2015-08-03: qty 50

## 2015-08-03 MED ORDER — FUROSEMIDE 40 MG PO TABS: 40.0000 mg | ORAL_TABLET | Freq: Two times a day (BID) | ORAL | Status: DC

## 2015-08-03 MED ORDER — FUROSEMIDE 40 MG PO TABS
80.0000 mg | ORAL_TABLET | Freq: Once | ORAL | Status: AC
  Administered 2015-08-03: 80 mg via ORAL
  Filled 2015-08-03: qty 2

## 2015-08-03 NOTE — Discharge Instructions (Signed)
Stasis Dermatitis Stasis dermatitis occurs when veins lose the ability to pump blood back to the heart (poor venous circulation). It causes a reddish-purple to brownish scaly, itchy rash on the legs. The rash comes from pooling of blood (stasis). CAUSES  This occurs because the veins do not work very well anymore or because pressure may be increased in the veins due to other conditions. With blood pooling, the increased pressure in the tiny blood vessels (capillaries) causes fluid to leak out of the capillaries into the tissue. The extra fluid makes it harder for the blood to feed the cells and get rid of waste products. SYMPTOMS  Stasis dermatitis appears as red, scaly, itchy patches on the legs. A yellowish or light brown discoloration is also present. Due to scratching or other injury, these patches can become an ulcer. This ulcer may remain for long periods of time. The ulcer can also become infected. Swelling of the legs is often present with stasis dermatitis. If the leg is swollen, this increases the risk of infection and further damage to the skin. Sometimes, intense itching, tingling, and burning occurs before signs of stasis dermatitis appear. You may find yourself scratching the insides of your ankles or rubbing your ankles together before the rash appears. After healing, there are often brown spots on the affected skin. DIAGNOSIS  Your caregiver makes this diagnosis based on an exam. Other tests may be done to better understand the cause. TREATMENT  If underlying conditions are present, they must be treated. Some of these conditions are heart failure, thyroid problems, poor nutrition, and varicose veins.  Cortisone creams and ointments applied to the skin (topically) may be needed, as well as medicine to reduce swelling in the legs (diuretics).  Compression stockings or an elastic wrap may also be needed to reduce swelling.  If there is an infection, antibiotic medicines may also be  used. HOME CARE INSTRUCTIONS   Try to rest and raise (elevate) the affected leg above the level of the heart, if possible.  Burow's solution wet packs applied for 30 minutes, 3 times daily, will help the weepy rash. Stop using the packs before your skin gets too dry. You can also use a mixture of 3 parts white vinegar to 1 quart water.  Grease your legs daily with ointments, such as petroleum jelly, to fight dryness.  Avoid scratching or injuring the area. SEEK IMMEDIATE MEDICAL CARE IF:   Your rash gets worse.  An ulcer forms.  You have an oral temperature above 102 F (38.9 C), not controlled by medicine.  You have any other severe symptoms. Document Released: 03/21/2006 Document Revised: 03/03/2012 Document Reviewed: 05/01/2010 [facility] Patient Information 2015 [place], [place]. This information is not intended to replace advice given to you by your health care provider. Make sure you discuss any questions you have with your health care provider.  Compression Stockings Compression stockings are elastic stockings that "compress" your legs. This helps to increase blood flow, decrease swelling, and reduces the chance of getting blood clots in your lower legs. Compression stockings are used:  After surgery.  If you have a history of poor circulation.  If you are prone to blood clots.  If you have varicose veins.  If you sit or are bedridden for long periods of time. WEARING COMPRESSION STOCKINGS  Your compression stockings should be worn as instructed by your caregiver.  Wearing the correct stocking size is important. Your caregiver can help measure and fit you to the correct size.  When wearing your stockings, do not allow the stockings to bunch up. This is especially important around your toes or behind your knees. Keep the stockings as smooth as possible.  Do not roll the stockings downward and leave them rolled down. This can form a restrictive band around your legs and  can decrease blood flow.  The stockings should be removed once a day for 1 hour or as instructed by your caregiver. When the stockings are taken off, inspect your legs and feet. Look for:  Open sores.  Red spots.  Puffy areas (swelling).  Anything that does not seem normal. IMPORTANT INFORMATION ABOUT COMPRESSION STOCKINGS  The compression stockings should be clean, dry, and in good condition before you put them on.  Do not put lotion on your legs or feet. This makes it harder to put the stockings on.  Change your stockings immediately if they become wet or soiled.  Do not wear stockings that are ripped or torn.  You may hand-wash or put your stockings in the washing machine. Use cold or warm water with mild detergent. Do not bleach your stockings. They may be air-dried or dried in the dryer on low heat.  If you have pain or have a feeling of "pins and needles" in your feet or legs, you may be wearing stockings that are too tight. Call your caregiver right away. SEEK IMMEDIATE MEDICAL CARE IF:   You have numbness or tingling in your lower legs that does not get better quickly after the stockings are removed.  Your toes or feet become cold and blue.  You develop open sores or have red spots on your legs that do not go away. MAKE SURE YOU:   Understand these instructions.  Will watch your condition.  Will get help right away if you are not doing well or get worse. Document Released: 10/07/2009 Document Revised: 03/03/2012 Document Reviewed: 10/07/2009 [facility] Patient Information 2015 [place], [place]. This information is not intended to replace advice given to you by your health care provider. Make sure you discuss any questions you have with your health care provider.

## 2015-08-03 NOTE — ED Provider Notes (Signed)
CSN: [id number]   Arrival date & time 08/03/15 [id number]  History  This chart was scribed for  Kari Schmidt, MD by Altamease Oiler, ED Scribe. This patient was seen in room APA04/APA04 and the patient's care was started at 12:08 PM.  Chief Complaint  Patient presents with  . Wound Infection    HPI The history is provided by the patient. No language interpreter was used.   Kari [name] is a 54 y.o. female who presents to the Emergency Department complaining of worsening open wounds at the bilateral LE with onset 2 days ago. The pt has wounds to the anterior right leg since this morning and posterior left leg for 2 days. She has had similar wounds in the past and was treated at a wound care clinic in [place]. Associated BLE swelling. No fever or chills,  She denies missing any doses of her home medication prior to today. Pt is ambulatory and active at home. She takes 40 mg of Lasix daily. No recent dietary changes but has a salt heavy diet.    Past Medical History  Diagnosis Date  . Hypertension   . Depression   . Asthma   . Ulcer     left posterior calf  . Headache(784.0)   . Hyperlipidemia   . Lung nodules   . Sarcoidosis   . CHF (congestive heart failure) 03/12/2014  . Renal disorder     Past Surgical History  Procedure Laterality Date  . No past surgeries    . Cataract surgery Left 07-2013    Family History  Problem Relation Age of Onset  . Heart failure Mother   . Heart disease Father     Social History  Substance Use Topics  . Smoking status: Never Smoker   . Smokeless tobacco: Never Used  . Alcohol Use: No     Review of Systems 10 Systems reviewed and all are negative for acute change except as noted in the HPI.   Home Medications   Prior to Admission medications   Medication Sig Start Date End Date Taking? Authorizing Provider  albuterol (PROVENTIL HFA;VENTOLIN HFA) 108 (90 BASE) MCG/ACT inhaler Inhale 2 puffs into the lungs every 6 (six) hours as needed for  shortness of breath.    Historical Provider, MD  amLODipine (NORVASC) 5 MG tablet Take 5 mg by mouth every morning.     Historical Provider, MD  aspirin EC 81 MG tablet Take 81 mg by mouth daily.    Historical Provider, MD  furosemide (LASIX) 40 MG tablet Take 1 tablet (40 mg total) by mouth daily. 04/10/14   Daleen Bo, MD  ibuprofen (ADVIL,MOTRIN) 800 MG tablet Take 800 mg by mouth every 6 (six) hours as needed for moderate pain.    Historical Provider, MD  potassium chloride SA (K-DUR,KLOR-CON) 20 MEQ tablet Take 1 tablet (20 mEq total) by mouth daily. 04/10/14   Daleen Bo, MD    Allergies  Review of patient's allergies indicates no known allergies.  Triage Vitals: BP 166/95 mmHg  Pulse 72  Temp(Src) 98.3 F (36.8 C) (Oral)  Resp 20  Ht 5\' 6"  (1.676 m)  Wt 330 lb (149.687 kg)  BMI 53.29 kg/m2  SpO2 100%  LMP 11/21/2011  Physical Exam  Constitutional: She is oriented to person, place, and time. She appears well-developed and well-nourished. No distress.  HENT:  Head: Normocephalic and atraumatic.  Eyes: EOM are normal.  Neck: Normal range of motion.  Cardiovascular: Normal rate and regular rhythm.  Pulmonary/Chest: Effort normal and breath sounds normal.  Abdominal: Soft. She exhibits no distension. There is no tenderness.  Musculoskeletal: Normal range of motion.  Superficial ulceration without surrounding erythema or warmth of anterior RLE 2+ edema RLE  Ulceration of distal lateral LLE 2+ edema No erythema  Neurological: She is alert and oriented to person, place, and time.  Skin: Skin is warm and dry.  Psychiatric: She has a normal mood and affect. Judgment normal.  Nursing note and vitals reviewed.   ED Course  Procedures   DIAGNOSTIC STUDIES: Oxygen Saturation is 100% on RA, normal by my interpretation.    COORDINATION OF CARE: 12:13 PM Discussed treatment plan which includes lab work, Lasix, and Bactrim DS; Septra DS with pt at bedside and pt agreed  to plan.  Labs Review- Labs Reviewed - No data to display  Imaging Review No results found.  EKG Interpretation None      MDM   Final diagnoses:  Venous stasis ulcer, unspecified laterality     Patient is overall well-appearing.  Her vital signs are normal.  Her white blood cell count is normal.  This is likely venous stasis ulcerations.  No overt signs of infection at this time.  I will double the patient's Lasix and recommended elevation and compression stockings.  Patient will continue to apply Silvadene cream to her wounds.  Will place patient on a short course of Bactrim.  She understands to return to the emergency department for any new or worsening symptoms.    I personally performed the services described in this documentation, which was scribed in my presence. The recorded information has been reviewed and is accurate.     Kari Schmidt, MD 08/03/15 1336

## 2015-08-03 NOTE — ED Notes (Signed)
Pt reports swelling and open wounds to legs with weeping edema.

## 2015-08-22 ENCOUNTER — Emergency Department (HOSPITAL_COMMUNITY): Payer: Medicaid Other

## 2015-08-22 ENCOUNTER — Encounter (HOSPITAL_COMMUNITY): Payer: Self-pay | Admitting: Cardiology

## 2015-08-22 ENCOUNTER — Emergency Department (HOSPITAL_COMMUNITY)
Admission: EM | Admit: 2015-08-22 | Discharge: 2015-08-22 | Disposition: A | Discharge status: Home / Self Care | Payer: Medicaid Other | Attending: Emergency Medicine | Admitting: Emergency Medicine

## 2015-08-22 DIAGNOSIS — I83009 Varicose veins of unspecified lower extremity with ulcer of unspecified site: Secondary | ICD-10-CM | POA: Insufficient documentation

## 2015-08-22 DIAGNOSIS — L97909 Non-pressure chronic ulcer of unspecified part of unspecified lower leg with unspecified severity: Secondary | ICD-10-CM | POA: Insufficient documentation

## 2015-08-22 DIAGNOSIS — Z79899 Other long term (current) drug therapy: Secondary | ICD-10-CM | POA: Insufficient documentation

## 2015-08-22 DIAGNOSIS — Z8639 Personal history of other endocrine, nutritional and metabolic disease: Secondary | ICD-10-CM | POA: Insufficient documentation

## 2015-08-22 DIAGNOSIS — Z862 Personal history of diseases of the blood and blood-forming organs and certain disorders involving the immune mechanism: Secondary | ICD-10-CM | POA: Insufficient documentation

## 2015-08-22 DIAGNOSIS — I509 Heart failure, unspecified: Secondary | ICD-10-CM | POA: Insufficient documentation

## 2015-08-22 DIAGNOSIS — Z7982 Long term (current) use of aspirin: Secondary | ICD-10-CM | POA: Insufficient documentation

## 2015-08-22 DIAGNOSIS — I10 Essential (primary) hypertension: Secondary | ICD-10-CM | POA: Insufficient documentation

## 2015-08-22 DIAGNOSIS — Z8659 Personal history of other mental and behavioral disorders: Secondary | ICD-10-CM | POA: Insufficient documentation

## 2015-08-22 DIAGNOSIS — Z87448 Personal history of other diseases of urinary system: Secondary | ICD-10-CM | POA: Insufficient documentation

## 2015-08-22 DIAGNOSIS — J45909 Unspecified asthma, uncomplicated: Secondary | ICD-10-CM | POA: Insufficient documentation

## 2015-08-22 LAB — COMPREHENSIVE METABOLIC PANEL
ALT: 13 U/L — ABNORMAL LOW (ref 14–54)
AST: 14 U/L — ABNORMAL LOW (ref 15–41)
Albumin: 3.5 g/dL (ref 3.5–5.0)
Alkaline Phosphatase: 58 U/L (ref 38–126)
Anion gap: 7 (ref 5–15)
BUN: 13 mg/dL (ref 6–20)
CO2: 28 mmol/L (ref 22–32)
Calcium: 8.8 mg/dL — ABNORMAL LOW (ref 8.9–10.3)
Chloride: 103 mmol/L (ref 101–111)
Creatinine, Ser: 0.86 mg/dL (ref 0.44–1.00)
GFR calc Af Amer: >60 mL/min (ref >60)
GFR calc non Af Amer: >60 mL/min (ref >60)
Glucose, Bld: 95 mg/dL (ref 65–99)
Potassium: 3.7 mmol/L (ref 3.5–5.1)
Sodium: 138 mmol/L (ref 135–145)
Total Bilirubin: 0.5 mg/dL (ref 0.3–1.2)
Total Protein: 8.5 g/dL — ABNORMAL HIGH (ref 6.5–8.1)

## 2015-08-22 LAB — CBC WITH DIFFERENTIAL/PLATELET
Basophils Absolute: 0 10*3/uL (ref 0.0–0.1)
Basophils Relative: 0 % (ref 0–1)
Eosinophils Absolute: 0.2 10*3/uL (ref 0.0–0.7)
Eosinophils Relative: 2 % (ref 0–5)
HCT: 39 % (ref 36.0–46.0)
Hemoglobin: 12.4 g/dL (ref 12.0–15.0)
Lymphocytes Relative: 28 % (ref 12–46)
Lymphs Abs: 2.3 10*3/uL (ref 0.7–4.0)
MCH: 29.2 pg (ref 26.0–34.0)
MCHC: 31.8 g/dL (ref 30.0–36.0)
MCV: 92 fL (ref 78.0–100.0)
Monocytes Absolute: 0.6 10*3/uL (ref 0.1–1.0)
Monocytes Relative: 7 % (ref 3–12)
Neutro Abs: 5 10*3/uL (ref 1.7–7.7)
Neutrophils Relative %: 63 % (ref 43–77)
Platelets: 262 10*3/uL (ref 150–400)
RBC: 4.24 MIL/uL (ref 3.87–5.11)
RDW: 14.8 % (ref 11.5–15.5)
WBC: 8.1 10*3/uL (ref 4.0–10.5)

## 2015-08-22 LAB — BRAIN NATRIURETIC PEPTIDE: B Natriuretic Peptide: 243 pg/mL — ABNORMAL HIGH (ref 0.0–100.0)

## 2015-08-22 MED ORDER — ONDANSETRON HCL 4 MG/2ML IJ SOLN
4.0000 mg | Freq: Once | INTRAMUSCULAR | Status: AC
  Administered 2015-08-22: 4 mg via INTRAVENOUS
  Filled 2015-08-22: qty 2

## 2015-08-22 MED ORDER — DOXYCYCLINE HYCLATE 100 MG PO CAPS: 100.0000 mg | ORAL_CAPSULE | Freq: Two times a day (BID) | ORAL | Status: DC

## 2015-08-22 MED ORDER — TRAMADOL HCL 50 MG PO TABS: 50.0000 mg | ORAL_TABLET | Freq: Four times a day (QID) | ORAL | Status: DC | PRN

## 2015-08-22 MED ORDER — SILVASORB EX GEL: CUTANEOUS | Status: DC

## 2015-08-22 MED ORDER — MORPHINE SULFATE (PF) 4 MG/ML IV SOLN
4.0000 mg | Freq: Once | INTRAVENOUS | Status: AC
  Administered 2015-08-22: 4 mg via INTRAVENOUS
  Filled 2015-08-22: qty 1

## 2015-08-22 MED ORDER — SILVER SULFADIAZINE 1 % EX CREA
TOPICAL_CREAM | Freq: Once | CUTANEOUS | Status: AC
  Administered 2015-08-22: 22:00:00 via TOPICAL
  Filled 2015-08-22: qty 50

## 2015-08-22 NOTE — Discharge Instructions (Signed)
Compression Stockings Compression stockings are elastic stockings that "compress" your legs. This helps to increase blood flow, decrease swelling, and reduces the chance of getting blood clots in your lower legs. Compression stockings are used:  After surgery.  If you have a history of poor circulation.  If you are prone to blood clots.  If you have varicose veins.  If you sit or are bedridden for long periods of time. WEARING COMPRESSION STOCKINGS  Your compression stockings should be worn as instructed by your caregiver.  Wearing the correct stocking size is important. Your caregiver can help measure and fit you to the correct size.  When wearing your stockings, do not allow the stockings to bunch up. This is especially important around your toes or behind your knees. Keep the stockings as smooth as possible.  Do not roll the stockings downward and leave them rolled down. This can form a restrictive band around your legs and can decrease blood flow.  The stockings should be removed once a day for 1 hour or as instructed by your caregiver. When the stockings are taken off, inspect your legs and feet. Look for:  Open sores.  Red spots.  Puffy areas (swelling).  Anything that does not seem normal. IMPORTANT INFORMATION ABOUT COMPRESSION STOCKINGS  The compression stockings should be clean, dry, and in good condition before you put them on.  Do not put lotion on your legs or feet. This makes it harder to put the stockings on.  Change your stockings immediately if they become wet or soiled.  Do not wear stockings that are ripped or torn.  You may hand-wash or put your stockings in the washing machine. Use cold or warm water with mild detergent. Do not bleach your stockings. They may be air-dried or dried in the dryer on low heat.  If you have pain or have a feeling of "pins and needles" in your feet or legs, you may be wearing stockings that are too tight. Call your caregiver  right away. SEEK IMMEDIATE MEDICAL CARE IF:   You have numbness or tingling in your lower legs that does not get better quickly after the stockings are removed.  Your toes or feet become cold and blue.  You develop open sores or have red spots on your legs that do not go away. MAKE SURE YOU:   Understand these instructions.  Will watch your condition.  Will get help right away if you are not doing well or get worse. Document Released: 10/07/2009 Document Revised: 03/03/2012 Document Reviewed: 10/07/2009 [facility] Patient Information 2015 [place], [place]. This information is not intended to replace advice given to you by your health care provider. Make sure you discuss any questions you have with your health care provider.  Stasis Ulcer Stasis ulcers occur in the legs when the circulation is damaged. An ulcer may look like a small hole in the skin.  CAUSES Stasis ulcers occur because your veins do not work properly. Veins have valves that help the blood return to the heart. If these valves do not work right, blood flows backwards and backs up into the veins near the skin. This condition causes the veins to become larger because of increased pressure and may lead to a stasis ulcer. SYMPTOMS   Shallow (superficial) sore on the leg.  Clear drainage or weeping from the sore.  Leg pain or a feeling of heaviness. This may be worse at the end of the day.  Leg swelling.  Skin color changes. DIAGNOSIS  Your caregiver will make a diagnosis by examining your leg. Your caregiver may order tests such as an ultrasound or other studies to evaluate the blood flow of the leg. HOME CARE INSTRUCTIONS   Do not stand or sit in one position for long periods of time. Do not sit with your legs crossed. Rest with your legs raised during the day. If possible, it is best if you can elevate your legs above your heart for 30 minutes, 3 to 4 times a day.  Wear elastic stockings or support hose. Do not wear  other tight encircling garments around legs, pelvis, or waist. This causes increased pressure in your veins. If your caregiver has applied compressive medicated wraps, use them as instructed.  Walk as much as possible to increase blood flow. If you are taking long rides in a car or plane, take a break to walk around every 2 hours. If not already on aspirin, take a baby aspirin before long trips unless you have medical reasons that prohibit this.  Raise the foot of your bed at night with 2-inch blocks if approved by your caregiver. This may not be desirable if you have heart failure or breathing problems.  If you get a cut in the skin over the vein and the vein bleeds, lie down with your leg raised and gently clean the area with a clean cloth. Apply pressure on the cut until the bleeding stops. Then place a dressing on the cut. See your caregiver if it continues to bleed or needs stitches. Also, see your caregiver if you develop an infection.Signs of an infection include a fever, redness, increased pain, and drainage of pus.  If your caregiver has given you a follow-up appointment, it is very important to keep that appointment. Not keeping the appointment could result in a chronic or permanent injury, pain, and disability. If there is any problem keeping the appointment, call your caregiver for assistance. SEEK IMMEDIATE MEDICAL CARE IF:  The ulcer area starts to break down.  You have pain, redness, tenderness, pus, or hard swelling in your leg over a vein or near the ulcer.  Your leg pain is uncomfortable.  You develop an unexplained fever.  You develop chest pain or shortness of breath. Document Released: 09/04/2001 Document Revised: 03/03/2012 Document Reviewed: 04/01/2011 [facility] Patient Information 2015 [place], [place]. This information is not intended to replace advice given to you by your health care provider. Make sure you discuss any questions you have with your health care  provider.

## 2015-08-22 NOTE — ED Notes (Signed)
Skin ulcers to bilateral lower legs.  Pt seen here with same in the past.

## 2015-08-22 NOTE — ED Provider Notes (Addendum)
CSN: [id number]     Arrival date & time 08/22/15  1757 History   First MD Initiated Contact with Patient 08/22/15 2008     Chief Complaint  Patient presents with  . Skin Ulcer     (Consider location/radiation/quality/duration/timing/severity/associated sxs/prior Treatment) HPI Patient presents to the ER for evaluation of skin ulcer. Patient reports that she has had an ulcer on her leg for some time. She was previously treated at wound care center for this. She was using Silvadene gel on her leg with improvement, but she has run out and is now worsening. She is not on any oral antibiotic.  Past Medical History  Diagnosis Date  . Hypertension   . Depression   . Asthma   . Ulcer     left posterior calf  . Headache(784.0)   . Hyperlipidemia   . Lung nodules   . Sarcoidosis   . CHF (congestive heart failure) 03/12/2014  . Renal disorder    Past Surgical History  Procedure Laterality Date  . No past surgeries    . Cataract surgery Left 07-2013   Family History  Problem Relation Age of Onset  . Heart failure Mother   . Heart disease Father    Social History  Substance Use Topics  . Smoking status: Never Smoker   . Smokeless tobacco: Never Used  . Alcohol Use: No   OB History    No data available     Review of Systems  Skin: Positive for wound.  All other systems reviewed and are negative.     Allergies  Review of patient's allergies indicates no known allergies.  Home Medications   Prior to Admission medications   Medication Sig Start Date End Date Taking? Authorizing Provider  albuterol (PROVENTIL HFA;VENTOLIN HFA) 108 (90 BASE) MCG/ACT inhaler Inhale 2 puffs into the lungs every 6 (six) hours as needed for shortness of breath.    Historical Provider, MD  amLODipine (NORVASC) 5 MG tablet Take 5 mg by mouth every morning.     Historical Provider, MD  aspirin EC 81 MG tablet Take 81 mg by mouth daily.    Historical Provider, MD  doxycycline (VIBRAMYCIN) 100 MG  capsule Take 1 capsule (100 mg total) by mouth 2 (two) times daily. 08/22/15   Orpah Greek, MD  furosemide (LASIX) 40 MG tablet Take 1 tablet (40 mg total) by mouth 2 (two) times daily. 08/03/15   Jola Schmidt, MD  ibuprofen (ADVIL,MOTRIN) 800 MG tablet Take 800 mg by mouth every 6 (six) hours as needed for moderate pain.    Historical Provider, MD  potassium chloride SA (K-DUR,KLOR-CON) 20 MEQ tablet Take 1 tablet (20 mEq total) by mouth daily. 04/10/14   Daleen Bo, MD  traMADol (ULTRAM) 50 MG tablet Take 1 tablet (50 mg total) by mouth every 6 (six) hours as needed. 08/22/15   Orpah Greek, MD  Wound Dressings (SILVASORB) GEL Apply twice daily with dressing changes 08/22/15   Orpah Greek, MD   BP 145/93 mmHg  Pulse 86  Temp(Src) 97.8 F (36.6 C) (Oral)  Resp 18  Ht 5\' 7"  (1.702 m)  Wt 320 lb (145.151 kg)  BMI 50.11 kg/m2  SpO2 99%  LMP 11/21/2011 Physical Exam  Constitutional: She is oriented to person, place, and time. She appears well-developed and well-nourished. No distress.  HENT:  Head: Normocephalic and atraumatic.  Right Ear: Hearing normal.  Left Ear: Hearing normal.  Nose: Nose normal.  Mouth/Throat: Oropharynx is clear and moist  and mucous membranes are normal.  Eyes: Conjunctivae and EOM are normal. Pupils are equal, round, and reactive to light.  Neck: Normal range of motion. Neck supple.  Cardiovascular: Regular rhythm, S1 normal and S2 normal.  Exam reveals no gallop and no friction rub.   No murmur heard. Pulmonary/Chest: Effort normal and breath sounds normal. No respiratory distress. She exhibits no tenderness.  Abdominal: Soft. Normal appearance and bowel sounds are normal. There is no hepatosplenomegaly. There is no tenderness. There is no rebound, no guarding, no tenderness at McBurney's point and negative Murphy's sign. No hernia.  Musculoskeletal: Normal range of motion.  Neurological: She is alert and oriented to person, place, and  time. She has normal strength. No cranial nerve deficit or sensory deficit. Coordination normal. GCS eye subscore is 4. GCS verbal subscore is 5. GCS motor subscore is 6.  Skin: Skin is warm, dry and intact. No rash noted. No cyanosis.  Stasis ulcer left calf  Psychiatric: She has a normal mood and affect. Her speech is normal and behavior is normal. Thought content normal.  Nursing note and vitals reviewed.   ED Course  Procedures (including critical care time) Labs Review Labs Reviewed  COMPREHENSIVE METABOLIC PANEL - Abnormal; Notable for the following:    Calcium 8.8 (*)    Total Protein 8.5 (*)    AST 14 (*)    ALT 13 (*)    All other components within normal limits  BRAIN NATRIURETIC PEPTIDE - Abnormal; Notable for the following:    B Natriuretic Peptide 243.0 (*)    All other components within normal limits  CBC WITH DIFFERENTIAL/PLATELET    Imaging Review No results found. I have personally reviewed and evaluated these images and lab results as part of my medical decision-making.   EKG Interpretation None      MDM   Final diagnoses:  Stasis ulcer, unspecified laterality   venous stenosis ulcer  Presents to the ER for evaluation of persistent stasis ulcer. 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 She reports that the wound on her right leg is improving, but the ulceration on the lateral aspect of the left leg seems to be worsening. There is some drainage from the site. There is no associated erythema or cellulitis. This is somewhat chronic ulceration, does not appear to have a superinfection at this time. She will require outpatient management of this, possibly by [place]. Continue Silvadene cream and dressing changes. Doxycycline by mouth.    Orpah Greek, MD 08/22/15 2116  Orpah Greek, MD 09/06/15 2132

## 2015-08-22 NOTE — ED Notes (Signed)
Pt states understanding of care given and follow up instructions 

## 2015-09-09 ENCOUNTER — Encounter (HOSPITAL_COMMUNITY): Payer: Self-pay | Admitting: Emergency Medicine

## 2015-09-09 ENCOUNTER — Emergency Department (HOSPITAL_COMMUNITY)
Admission: EM | Admit: 2015-09-09 | Discharge: 2015-09-09 | Disposition: A | Discharge status: Home / Self Care | Payer: Medicaid Other | Attending: Emergency Medicine | Admitting: Emergency Medicine

## 2015-09-09 DIAGNOSIS — I83009 Varicose veins of unspecified lower extremity with ulcer of unspecified site: Secondary | ICD-10-CM | POA: Insufficient documentation

## 2015-09-09 DIAGNOSIS — I10 Essential (primary) hypertension: Secondary | ICD-10-CM | POA: Insufficient documentation

## 2015-09-09 DIAGNOSIS — Z792 Long term (current) use of antibiotics: Secondary | ICD-10-CM | POA: Insufficient documentation

## 2015-09-09 DIAGNOSIS — L97909 Non-pressure chronic ulcer of unspecified part of unspecified lower leg with unspecified severity: Secondary | ICD-10-CM

## 2015-09-09 DIAGNOSIS — J45909 Unspecified asthma, uncomplicated: Secondary | ICD-10-CM | POA: Insufficient documentation

## 2015-09-09 DIAGNOSIS — Z87448 Personal history of other diseases of urinary system: Secondary | ICD-10-CM | POA: Insufficient documentation

## 2015-09-09 DIAGNOSIS — Z7982 Long term (current) use of aspirin: Secondary | ICD-10-CM | POA: Insufficient documentation

## 2015-09-09 DIAGNOSIS — Z8639 Personal history of other endocrine, nutritional and metabolic disease: Secondary | ICD-10-CM | POA: Insufficient documentation

## 2015-09-09 DIAGNOSIS — I509 Heart failure, unspecified: Secondary | ICD-10-CM | POA: Insufficient documentation

## 2015-09-09 DIAGNOSIS — Z79899 Other long term (current) drug therapy: Secondary | ICD-10-CM | POA: Insufficient documentation

## 2015-09-09 DIAGNOSIS — Z8659 Personal history of other mental and behavioral disorders: Secondary | ICD-10-CM | POA: Insufficient documentation

## 2015-09-09 DIAGNOSIS — Z862 Personal history of diseases of the blood and blood-forming organs and certain disorders involving the immune mechanism: Secondary | ICD-10-CM | POA: Insufficient documentation

## 2015-09-09 MED ORDER — HYDROCODONE-ACETAMINOPHEN 5-325 MG PO TABS
2.0000 | ORAL_TABLET | Freq: Once | ORAL | Status: AC
  Administered 2015-09-09: 2 via ORAL
  Filled 2015-09-09: qty 2

## 2015-09-09 MED ORDER — SULFAMETHOXAZOLE-TRIMETHOPRIM 800-160 MG PO TABS
1.0000 | ORAL_TABLET | Freq: Once | ORAL | Status: AC
  Administered 2015-09-09: 1 via ORAL
  Filled 2015-09-09: qty 1

## 2015-09-09 MED ORDER — SULFAMETHOXAZOLE-TRIMETHOPRIM 800-160 MG PO TABS: 1.0000 | ORAL_TABLET | Freq: Two times a day (BID) | ORAL | Status: AC

## 2015-09-09 MED ORDER — TRAMADOL HCL 50 MG PO TABS: 50.0000 mg | ORAL_TABLET | Freq: Four times a day (QID) | ORAL | Status: DC | PRN

## 2015-09-09 NOTE — Discharge Instructions (Signed)
Stasis Dermatitis Stasis dermatitis occurs when veins lose the ability to pump blood back to the heart (poor venous circulation). It causes a reddish-purple to brownish scaly, itchy rash on the legs. The rash comes from pooling of blood (stasis). CAUSES  This occurs because the veins do not work very well anymore or because pressure may be increased in the veins due to other conditions. With blood pooling, the increased pressure in the tiny blood vessels (capillaries) causes fluid to leak out of the capillaries into the tissue. The extra fluid makes it harder for the blood to feed the cells and get rid of waste products. SYMPTOMS  Stasis dermatitis appears as red, scaly, itchy patches on the legs. A yellowish or light brown discoloration is also present. Due to scratching or other injury, these patches can become an ulcer. This ulcer may remain for long periods of time. The ulcer can also become infected. Swelling of the legs is often present with stasis dermatitis. If the leg is swollen, this increases the risk of infection and further damage to the skin. Sometimes, intense itching, tingling, and burning occurs before signs of stasis dermatitis appear. You may find yourself scratching the insides of your ankles or rubbing your ankles together before the rash appears. After healing, there are often brown spots on the affected skin. DIAGNOSIS  Your caregiver makes this diagnosis based on an exam. Other tests may be done to better understand the cause. TREATMENT  If underlying conditions are present, they must be treated. Some of these conditions are heart failure, thyroid problems, poor nutrition, and varicose veins.  Cortisone creams and ointments applied to the skin (topically) may be needed, as well as medicine to reduce swelling in the legs (diuretics).  Compression stockings or an elastic wrap may also be needed to reduce swelling.  If there is an infection, antibiotic medicines may also be  used. HOME CARE INSTRUCTIONS   Try to rest and raise (elevate) the affected leg above the level of the heart, if possible.  Burow's solution wet packs applied for 30 minutes, 3 times daily, will help the weepy rash. Stop using the packs before your skin gets too dry. You can also use a mixture of 3 parts white vinegar to 1 quart water.  Grease your legs daily with ointments, such as petroleum jelly, to fight dryness.  Avoid scratching or injuring the area. SEEK IMMEDIATE MEDICAL CARE IF:   Your rash gets worse.  An ulcer forms.  You have an oral temperature above 102 F (38.9 C), not controlled by medicine.  You have any other severe symptoms. Document Released: 03/21/2006 Document Revised: 03/03/2012 Document Reviewed: 05/01/2010 [employer] Patient Information 2015 [employer]. This information is not intended to replace advice given to you by your health care provider. Make sure you discuss any questions you have with your health care provider.  

## 2015-09-09 NOTE — ED Provider Notes (Signed)
CSN: [id number]     Arrival date & time 09/09/15  1350 History   First MD Initiated Contact with Patient 09/09/15 1359     Chief Complaint  Patient presents with  . Skin Ulcer      HPI  Patient presents for evaluation of persistent skin ulcers. Was seen here a few weeks ago. Given a prescription for doxycycline. States she couldn't afford it. Still has drainage from her legs. Presents here for evaluation.  No increasing amount of swelling. No change in medications. 10 use to be compliant with her Lasix. Uses Silvadene cream intimately. She states she is not saying a wound care center right now because "I ain't got no insurance".  Past Medical History  Diagnosis Date  . Hypertension   . Depression   . Asthma   . Ulcer     left posterior calf  . Headache(784.0)   . Hyperlipidemia   . Lung nodules   . Sarcoidosis   . CHF (congestive heart failure) 03/12/2014  . Renal disorder    Past Surgical History  Procedure Laterality Date  . No past surgeries    . Cataract surgery Left 07-2013   Family History  Problem Relation Age of Onset  . Heart failure Mother   . Heart disease Father    Social History  Substance Use Topics  . Smoking status: Never Smoker   . Smokeless tobacco: Never Used  . Alcohol Use: No   OB History    No data available     Review of Systems  Constitutional: Negative for fever, chills, diaphoresis, appetite change and fatigue.  HENT: Negative for mouth sores, sore throat and trouble swallowing.   Eyes: Negative for visual disturbance.  Respiratory: Negative for cough, chest tightness, shortness of breath and wheezing.   Cardiovascular: Negative for chest pain.  Gastrointestinal: Negative for nausea, vomiting, abdominal pain, diarrhea and abdominal distention.  Endocrine: Negative for polydipsia, polyphagia and polyuria.  Genitourinary: Negative for dysuria, frequency and hematuria.  Musculoskeletal: Negative for gait problem.  Skin: Negative for  color change, pallor and rash.       Skin ulcerations and granulation tissue.  Neurological: Negative for dizziness, syncope, light-headedness and headaches.  Hematological: Does not bruise/bleed easily.  Psychiatric/Behavioral: Negative for behavioral problems and confusion.      Allergies  Review of patient's allergies indicates no known allergies.  Home Medications   Prior to Admission medications   Medication Sig Start Date End Date Taking? Authorizing Provider  albuterol (PROVENTIL HFA;VENTOLIN HFA) 108 (90 BASE) MCG/ACT inhaler Inhale 2 puffs into the lungs every 6 (six) hours as needed for shortness of breath.    Historical Provider, MD  amLODipine (NORVASC) 5 MG tablet Take 5 mg by mouth every morning.     Historical Provider, MD  aspirin EC 81 MG tablet Take 81 mg by mouth daily.    Historical Provider, MD  doxycycline (VIBRAMYCIN) 100 MG capsule Take 1 capsule (100 mg total) by mouth 2 (two) times daily. 08/22/15   Orpah Greek, MD  furosemide (LASIX) 40 MG tablet Take 1 tablet (40 mg total) by mouth 2 (two) times daily. 08/03/15   Jola Schmidt, MD  ibuprofen (ADVIL,MOTRIN) 800 MG tablet Take 800 mg by mouth every 6 (six) hours as needed for moderate pain.    Historical Provider, MD  potassium chloride SA (K-DUR,KLOR-CON) 20 MEQ tablet Take 1 tablet (20 mEq total) by mouth daily. 04/10/14   Daleen Bo, MD  sulfamethoxazole-trimethoprim (BACTRIM DS,SEPTRA DS) 800-160  MG per tablet Take 1 tablet by mouth 2 (two) times daily. 09/09/15 09/16/15  Tanna Furry, MD  traMADol (ULTRAM) 50 MG tablet Take 1 tablet (50 mg total) by mouth every 6 (six) hours as needed. 09/09/15   Tanna Furry, MD  Wound Dressings (SILVASORB) GEL Apply twice daily with dressing changes 08/22/15   Orpah Greek, MD   BP 159/82 mmHg  Pulse 74  Temp(Src) 98.2 F (36.8 C) (Oral)  Resp 15  Ht 5\' 7"  (1.702 m)  Wt 287 lb (130.182 kg)  BMI 44.94 kg/m2  SpO2 100%  LMP 11/21/2011 Physical Exam    Constitutional: She is oriented to person, place, and time. She appears well-developed and well-nourished. No distress.  HENT:  Head: Normocephalic.  Eyes: Conjunctivae are normal. Pupils are equal, round, and reactive to light. No scleral icterus.  Neck: Normal range of motion. Neck supple. No thyromegaly present.  Cardiovascular: Normal rate and regular rhythm.  Exam reveals no gallop and no friction rub.   No murmur heard. Pulmonary/Chest: Effort normal and breath sounds normal. No respiratory distress. She has no wheezes. She has no rales.  Abdominal: Soft. Bowel sounds are normal. She exhibits no distension. There is no tenderness. There is no rebound.  Musculoskeletal: Normal range of motion.  Neurological: She is alert and oriented to person, place, and time.  Skin: Skin is warm and dry. No rash noted.     Psychiatric: She has a normal mood and affect. Her behavior is normal.    ED Course  Procedures (including critical care time) Labs Review Labs Reviewed - No data to display  Imaging Review No results found. I have personally reviewed and evaluated these images and lab results as part of my medical decision-making.   EKG Interpretation None      MDM   Final diagnoses:  Venous stasis ulcers, unspecified laterality    Per her chart and discussion with her diagnosis is long-standing venous stasis disease. Shows no signs of arterial compromise or asymmetry to suggest DVT. Given a prescription for Bactrim for wound prophylaxis. Her wounds were redressed with nonadherent Vaseline gauze Kerlix and Ace wrap's. Asked to elevate whenever possible. States she been told by her vascular surgeon not to use compression hose. Electrolyte on Lasix, and elevation for her chronic edema.    Tanna Furry, MD 09/09/15 [phone]

## 2015-09-09 NOTE — ED Notes (Signed)
Patient states she has ulcers on bilateral legs and they started draining yellow discharge yesterday. Left lower extremity is covered with wrapping, right lower extremity noted to have large ulcerations with yellow and clear drainage from area.

## 2015-10-21 ENCOUNTER — Emergency Department (HOSPITAL_COMMUNITY)
Admission: EM | Admit: 2015-10-21 | Discharge: 2015-10-21 | Disposition: A | Discharge status: Home / Self Care | Payer: Medicaid Other | Attending: Emergency Medicine | Admitting: Emergency Medicine

## 2015-10-21 ENCOUNTER — Encounter (HOSPITAL_COMMUNITY): Payer: Self-pay | Admitting: Emergency Medicine

## 2015-10-21 DIAGNOSIS — I10 Essential (primary) hypertension: Secondary | ICD-10-CM | POA: Insufficient documentation

## 2015-10-21 DIAGNOSIS — Z87448 Personal history of other diseases of urinary system: Secondary | ICD-10-CM | POA: Insufficient documentation

## 2015-10-21 DIAGNOSIS — Z792 Long term (current) use of antibiotics: Secondary | ICD-10-CM | POA: Insufficient documentation

## 2015-10-21 DIAGNOSIS — Z7982 Long term (current) use of aspirin: Secondary | ICD-10-CM | POA: Insufficient documentation

## 2015-10-21 DIAGNOSIS — R2243 Localized swelling, mass and lump, lower limb, bilateral: Secondary | ICD-10-CM | POA: Insufficient documentation

## 2015-10-21 DIAGNOSIS — Z862 Personal history of diseases of the blood and blood-forming organs and certain disorders involving the immune mechanism: Secondary | ICD-10-CM | POA: Insufficient documentation

## 2015-10-21 DIAGNOSIS — Z79899 Other long term (current) drug therapy: Secondary | ICD-10-CM | POA: Insufficient documentation

## 2015-10-21 DIAGNOSIS — R600 Localized edema: Secondary | ICD-10-CM

## 2015-10-21 DIAGNOSIS — F329 Major depressive disorder, single episode, unspecified: Secondary | ICD-10-CM | POA: Insufficient documentation

## 2015-10-21 DIAGNOSIS — J45909 Unspecified asthma, uncomplicated: Secondary | ICD-10-CM | POA: Insufficient documentation

## 2015-10-21 DIAGNOSIS — Z8639 Personal history of other endocrine, nutritional and metabolic disease: Secondary | ICD-10-CM | POA: Insufficient documentation

## 2015-10-21 DIAGNOSIS — I509 Heart failure, unspecified: Secondary | ICD-10-CM | POA: Insufficient documentation

## 2015-10-21 LAB — CBC WITH DIFFERENTIAL/PLATELET
Basophils Absolute: 0 10*3/uL (ref 0.0–0.1)
Basophils Relative: 0 %
Eosinophils Absolute: 0.1 10*3/uL (ref 0.0–0.7)
Eosinophils Relative: 2 %
HCT: 38.4 % (ref 36.0–46.0)
Hemoglobin: 12.3 g/dL (ref 12.0–15.0)
Lymphocytes Relative: 15 %
Lymphs Abs: 1.1 10*3/uL (ref 0.7–4.0)
MCH: 29.6 pg (ref 26.0–34.0)
MCHC: 32 g/dL (ref 30.0–36.0)
MCV: 92.3 fL (ref 78.0–100.0)
Monocytes Absolute: 0.7 10*3/uL (ref 0.1–1.0)
Monocytes Relative: 10 %
Neutro Abs: 5.3 10*3/uL (ref 1.7–7.7)
Neutrophils Relative %: 73 %
Platelets: 247 10*3/uL (ref 150–400)
RBC: 4.16 MIL/uL (ref 3.87–5.11)
RDW: 14.8 % (ref 11.5–15.5)
WBC: 7.2 10*3/uL (ref 4.0–10.5)

## 2015-10-21 LAB — BASIC METABOLIC PANEL
Anion gap: 8 (ref 5–15)
BUN: 16 mg/dL (ref 6–20)
CO2: 31 mmol/L (ref 22–32)
Calcium: 9.2 mg/dL (ref 8.9–10.3)
Chloride: 101 mmol/L (ref 101–111)
Creatinine, Ser: 0.98 mg/dL (ref 0.44–1.00)
GFR calc Af Amer: >60 mL/min (ref >60)
GFR calc non Af Amer: >60 mL/min (ref >60)
Glucose, Bld: 113 mg/dL — ABNORMAL HIGH (ref 65–99)
Potassium: 2.9 mmol/L — ABNORMAL LOW (ref 3.5–5.1)
Sodium: 140 mmol/L (ref 135–145)

## 2015-10-21 MED ORDER — SILVASORB EX GEL: CUTANEOUS | Status: DC

## 2015-10-21 MED ORDER — TRAMADOL HCL 50 MG PO TABS: 50.0000 mg | ORAL_TABLET | Freq: Four times a day (QID) | ORAL | Status: DC | PRN

## 2015-10-21 MED ORDER — TRAMADOL HCL 50 MG PO TABS
50.0000 mg | ORAL_TABLET | Freq: Once | ORAL | Status: AC
  Administered 2015-10-21: 50 mg via ORAL
  Filled 2015-10-21: qty 1

## 2015-10-21 MED ORDER — POTASSIUM CHLORIDE CRYS ER 20 MEQ PO TBCR
40.0000 meq | EXTENDED_RELEASE_TABLET | Freq: Once | ORAL | Status: AC
  Administered 2015-10-21: 40 meq via ORAL
  Filled 2015-10-21: qty 2

## 2015-10-21 MED ORDER — CEPHALEXIN 500 MG PO CAPS
500.0000 mg | ORAL_CAPSULE | Freq: Once | ORAL | Status: AC
  Administered 2015-10-21: 500 mg via ORAL
  Filled 2015-10-21: qty 1

## 2015-10-21 MED ORDER — CEPHALEXIN 500 MG PO CAPS: 500.0000 mg | ORAL_CAPSULE | Freq: Two times a day (BID) | ORAL | Status: DC

## 2015-10-21 MED ORDER — HYDROCODONE-ACETAMINOPHEN 5-325 MG PO TABS
ORAL_TABLET | ORAL | Status: AC
  Filled 2015-10-21: qty 1

## 2015-10-21 MED ORDER — FUROSEMIDE 40 MG PO TABS: 40.0000 mg | ORAL_TABLET | Freq: Every day | ORAL | Status: DC

## 2015-10-21 MED ORDER — HYDROCODONE-ACETAMINOPHEN 5-325 MG PO TABS
1.0000 | ORAL_TABLET | Freq: Once | ORAL | Status: AC
  Administered 2015-10-21: 1 via ORAL

## 2015-10-21 MED ORDER — POTASSIUM CHLORIDE CRYS ER 20 MEQ PO TBCR: 20.0000 meq | EXTENDED_RELEASE_TABLET | Freq: Every day | ORAL | Status: DC

## 2015-10-21 NOTE — ED Provider Notes (Signed)
CSN: [id number]     Arrival date & time 10/21/15  1357 History   First MD Initiated Contact with Patient 10/21/15 1446     Chief Complaint  Patient presents with  . Skin Ulcer    both legs     The history is provided by the patient. No language interpreter was used.   Kari [name] is a 54 year old with history of presents for evaluation of skin ulcers. She has a history of chronic lower extremity edema and skin ulcers. She's experienced 3 days of increased swelling and discomfort to her legs. She denies any fevers, chest pain, shortness of breath, abdominal pain, vomiting, diarrhea. She was seen in the emergency Department 6 weeks ago for similar symptoms and she was prescribed an antibiotic but did not get it filled because it is too expensive.  Past Medical History  Diagnosis Date  . Hypertension   . Depression   . Asthma   . Ulcer     left posterior calf  . Headache(784.0)   . Hyperlipidemia   . Lung nodules   . Sarcoidosis ([place])   . CHF (congestive heart failure) ([place]) 03/12/2014  . Renal disorder    Past Surgical History  Procedure Laterality Date  . No past surgeries    . Cataract surgery Left 07-2013   Family History  Problem Relation Age of Onset  . Heart failure Mother   . Heart disease Father    Social History  Substance Use Topics  . Smoking status: Never Smoker   . Smokeless tobacco: Never Used  . Alcohol Use: No   OB History    No data available     Review of Systems  All other systems reviewed and are negative.     Allergies  Review of patient's allergies indicates no known allergies.  Home Medications   Prior to Admission medications   Medication Sig Start Date End Date Taking? Authorizing Provider  albuterol (PROVENTIL HFA;VENTOLIN HFA) 108 (90 BASE) MCG/ACT inhaler Inhale 2 puffs into the lungs every 6 (six) hours as needed for shortness of breath.   Yes Historical Provider, MD  amLODipine (NORVASC) 5 MG tablet Take 5 mg by mouth every  morning.    Yes Historical Provider, MD  aspirin EC 81 MG tablet Take 81 mg by mouth daily.   Yes Historical Provider, MD  doxycycline (VIBRAMYCIN) 100 MG capsule Take 1 capsule (100 mg total) by mouth 2 (two) times daily. 08/22/15  Yes Orpah Greek, MD  furosemide (LASIX) 40 MG tablet Take 1 tablet (40 mg total) by mouth 2 (two) times daily. 08/03/15  Yes Jola Schmidt, MD  ibuprofen (ADVIL,MOTRIN) 800 MG tablet Take 800 mg by mouth every 6 (six) hours as needed for moderate pain.   Yes Historical Provider, MD  potassium chloride SA (K-DUR,KLOR-CON) 20 MEQ tablet Take 1 tablet (20 mEq total) by mouth daily. 04/10/14  Yes Daleen Bo, MD  traMADol (ULTRAM) 50 MG tablet Take 1 tablet (50 mg total) by mouth every 6 (six) hours as needed. Patient not taking: Reported on 10/21/2015 09/09/15   Tanna Furry, MD  Wound Dressings (SILVASORB) GEL Apply twice daily with dressing changes Patient not taking: Reported on 10/21/2015 08/22/15   Orpah Greek, MD   BP 144/99 mmHg  Pulse 100  Temp(Src) 97.4 F (36.3 C) (Oral)  Resp 20  Ht 5\' 7"  (1.702 m)  Wt 300 lb (136.079 kg)  BMI 46.98 kg/m2  SpO2 100%  LMP 11/21/2011 Physical Exam  Constitutional:  She is oriented to person, place, and time. She appears well-developed and well-nourished.  HENT:  Head: Normocephalic and atraumatic.  Cardiovascular: Normal rate and regular rhythm.   No murmur heard. Pulmonary/Chest: Effort normal and breath sounds normal. No respiratory distress.  Abdominal: Soft. There is no tenderness. There is no rebound and no guarding.  Musculoskeletal:  3+ edema bilateral lower extremities. There is we [place] oozing from patches on bilateral lower extremities, right greater than left. There are elevated white patches and plaques on her anterior shins. There is mild erythema of the lower extremities  Neurological: She is alert and oriented to person, place, and time.  Skin: Skin is warm and dry.  Psychiatric: She  has a normal mood and affect. Her behavior is normal.  Nursing note and vitals reviewed.   ED Course  Procedures (including critical care time) Labs Review Labs Reviewed  BASIC METABOLIC PANEL - Abnormal; Notable for the following:    Potassium 2.9 (*)    Glucose, Bld 113 (*)    All other components within normal limits  CBC WITH DIFFERENTIAL/PLATELET    Imaging Review No results found. I have personally reviewed and evaluated these images and lab results as part of my medical decision-making.   EKG Interpretation None      MDM   Final diagnoses:  Bilateral edema of lower extremity    Patient with history of chronic wounds to bilateral lower extremities with chronic swelling in ulcerations. Her swelling has worsened over the last 3 days. She did not take antibiotics that were previously prescribed 4-6 weeks ago due to cost. She is nontoxic appearing on examination with no systemic symptoms. There is minimal erythema present. Presentation is not consistent with abscess, DVT, acute CHF. Discussed with patient healthcare for ulcers, continuing her Lasix and potassium. Treating with Keflex as patient cannot afford to doxycycline. Also prescribing for Silvadene gel. Discussed close return precautions for worsening symptoms.    Quintella Reichert, MD 10/21/15 2126

## 2015-10-21 NOTE — Discharge Instructions (Signed)
Venous Ulcer A venous ulcer is a shallow sore on your lower leg that is caused by poor circulation in your veins. This condition used to be called stasis ulcer.  Veins have valves that help return blood to the heart. If these valves do not work properly, it can cause blood to flow backward and to back up into the veins near the skin. When that happens, blood can pool in your lower legs. The blood can then leak out of your veins, which can irritate your skin. This may cause a break in your skin that becomes a venous ulcer. Venous ulcer is the most common type of lower leg ulcer. You may have venous ulcers on one leg or on both legs. The area where this condition most commonly develops is around the ankles. A venous ulcer may last for a long time (chronic ulcer) or it may return repeatedly (recurrent ulcer). CAUSES Any condition that causes poor circulation to your legs can lead to a venous ulcer.  RISK FACTORS This condition is more likely to develop in:  People who are 10 years of age or older.  People who are overweight.  People who are not active.  People who have had a leg ulcer in the past.  People who have clots in their lower leg veins (deep vein thrombosis).  People who have inflammation of their leg veins (phlebitis).  Women who have given birth.  People who smoke. SYMPTOMS  The most common symptom of this condition is an open sore near your ankle. Other symptoms may include:  Swelling.  Thickening of the skin.  Fluid leaking from the ulcer.  Bleeding.  Itching.  Pain and swelling that gets worse when you stand up and feels better when you raise your leg.  Blotchy skin.  Darkening of the skin. DIAGNOSIS  Your health care provider may suspect a venous ulcer based on your medical history and your risk factors. Your health care provider will check the skin on your legs. Other tests may be done to learn more about the ulcer and to determine the best way to treat it.  Tests that may be done include:  Measuring the blood pressure in your arms and legs.  Using sound waves (ultrasound) to measure the blood flow in your leg veins. TREATMENT You may need to try several different types of treatment to get your venous ulcer to heal. Healing may take a long time. Treatment may include:  Keeping your leg raised (elevated).  Wearing a type of bandage or stocking to compress the veins of your leg (compression therapy). Venous wounds are not likely to heal or to stay healed without compression.  Taking medicines to improve blood flow.  Taking antibiotic medicines to treat infection.  Cleaning your ulcer and removing any dead tissue from the wound (debridement).  Placing various types of medicated bandage (dressings) or medicated wraps on your ulcer. This helps the ulcer to heal and helps to prevent infection. Surgery is sometimes needed to close the wound using a piece of skin taken from another area of your body (graft). You may need surgery if other treatments are not working or if your ulcer is very deep. HOME CARE INSTRUCTIONS Wound Care  Follow instructions from your health care provider about:  How to take care of your wound.  When and how you should change your bandage (dressing).  When you should remove your dressing. If your dressing is dry and sticks to your leg when you try to remove  it, moisten or wet the dressing with saline solution or water so that the dressing can be removed without harming your skin or wound tissue.  Check your wound every day for signs of infection. Have a caregiver do this for you if you are not able to do it yourself. Check for:  More redness, swelling, or pain. More fluid or blood.  Pus, warmth, or a bad smell. Medicines   Take over-the-counter and prescription medicines only as told by your health care provider.  If you were prescribed an antibiotic medicine, take it or apply it as told by your health care  provider. Do not stop taking or using the antibiotic even if your condition improves. Activity  Do not stand or sit in one position for a long period of time. Rest with your legs raised during the day. If possible, keep your legs above your heart for 30 minutes, 3-4 times a day, or as told by your health care provider.  Do not sit with your legs crossed.   Walk often to increase the blood flow in your legs.Ask your health care provider what level of activity is safe for you.  If you are taking a long ride in a car or plane, take a break to walk around at least once every two hours, or as often as your health care provider recommends. Ask your health care provider if you should take aspirin before long trips.  General Instructions  Wear elastic stockings, compression stockings, or support hose as told by your health care provider. This is very important.  Raise the foot of your bed as told by your health care provider.  Do not smoke.  Keep all follow-up visits as told by your health care provider. This is important. SEEK MEDICAL CARE IF:   You have a fever.   Your ulcer is getting larger or is not healing.   Your pain gets worse.   You have more redness or swelling around your ulcer.  You have more fluid, blood, or pus coming from your ulcer after it has been cleaned by you or your health care provider.  You have warmth or a bad smell coming from your ulcer.   This information is not intended to replace advice given to you by your health care provider. Make sure you discuss any questions you have with your health care provider.   Document Released: 09/04/2001 Document Revised: 08/31/2015 Document Reviewed: 04/20/2015 Elsevier Interactive Patient Education 2016 [employer]  Peripheral Edema You have swelling in your legs (peripheral edema). This swelling is due to excess accumulation of salt and water in your body. Edema may be a sign of heart, kidney or liver disease,  or a side effect of a medication. It may also be due to problems in the leg veins. Elevating your legs and using special support stockings may be very helpful, if the cause of the swelling is due to poor venous circulation. Avoid long periods of standing, whatever the cause. Treatment of edema depends on identifying the cause. Chips, pretzels, pickles and other salty foods should be avoided. Restricting salt in your diet is almost always needed. Water pills (diuretics) are often used to remove the excess salt and water from your body via urine. These medicines prevent the kidney from reabsorbing sodium. This increases urine flow. Diuretic treatment may also result in lowering of potassium levels in your body. Potassium supplements may be needed if you have to use diuretics daily. Daily weights can help you keep  track of your progress in clearing your edema. You should call your caregiver for follow up care as recommended. SEEK IMMEDIATE MEDICAL CARE IF:   You have increased swelling, pain, redness, or heat in your legs.  You develop shortness of breath, especially when lying down.  You develop chest or abdominal pain, weakness, or fainting.  You have a fever.   This information is not intended to replace advice given to you by your health care provider. Make sure you discuss any questions you have with your health care provider.   Document Released: 01/17/2005 Document Revised: 03/03/2012 Document Reviewed: 06/22/2015 Elsevier Interactive Patient Education [employer].

## 2015-10-21 NOTE — ED Notes (Signed)
Was seen in ED about one month ago for ulcers to both legs.  Pt was given antibiotic but did not get medication and pt was not able to pay $60 for medication.  Pt says she has no insurance.  Having yellow drainage and foul smelling.  Area is severely swollen and skin is black.  Has white raised patches to both lower extremities.

## 2016-01-11 ENCOUNTER — Encounter (HOSPITAL_COMMUNITY): Payer: Self-pay | Admitting: Emergency Medicine

## 2016-01-11 ENCOUNTER — Emergency Department (HOSPITAL_COMMUNITY)
Admission: EM | Admit: 2016-01-11 | Discharge: 2016-01-11 | Disposition: A | Discharge status: Home / Self Care | Payer: Medicare Other | Attending: Emergency Medicine | Admitting: Emergency Medicine

## 2016-01-11 DIAGNOSIS — I509 Heart failure, unspecified: Secondary | ICD-10-CM | POA: Diagnosis not present

## 2016-01-11 DIAGNOSIS — Z8639 Personal history of other endocrine, nutritional and metabolic disease: Secondary | ICD-10-CM | POA: Diagnosis not present

## 2016-01-11 DIAGNOSIS — I10 Essential (primary) hypertension: Secondary | ICD-10-CM | POA: Diagnosis not present

## 2016-01-11 DIAGNOSIS — J45909 Unspecified asthma, uncomplicated: Secondary | ICD-10-CM | POA: Diagnosis not present

## 2016-01-11 DIAGNOSIS — Z7982 Long term (current) use of aspirin: Secondary | ICD-10-CM | POA: Diagnosis not present

## 2016-01-11 DIAGNOSIS — L97911 Non-pressure chronic ulcer of unspecified part of right lower leg limited to breakdown of skin: Secondary | ICD-10-CM | POA: Insufficient documentation

## 2016-01-11 DIAGNOSIS — Z8659 Personal history of other mental and behavioral disorders: Secondary | ICD-10-CM | POA: Insufficient documentation

## 2016-01-11 DIAGNOSIS — Z862 Personal history of diseases of the blood and blood-forming organs and certain disorders involving the immune mechanism: Secondary | ICD-10-CM | POA: Insufficient documentation

## 2016-01-11 DIAGNOSIS — Z79899 Other long term (current) drug therapy: Secondary | ICD-10-CM | POA: Diagnosis not present

## 2016-01-11 DIAGNOSIS — R21 Rash and other nonspecific skin eruption: Secondary | ICD-10-CM | POA: Diagnosis present

## 2016-01-11 DIAGNOSIS — L97921 Non-pressure chronic ulcer of unspecified part of left lower leg limited to breakdown of skin: Secondary | ICD-10-CM | POA: Diagnosis not present

## 2016-01-11 DIAGNOSIS — Z8742 Personal history of other diseases of the female genital tract: Secondary | ICD-10-CM | POA: Diagnosis not present

## 2016-01-11 LAB — COMPREHENSIVE METABOLIC PANEL
ALT: 13 U/L — ABNORMAL LOW (ref 14–54)
AST: 18 U/L (ref 15–41)
Albumin: 3.2 g/dL — ABNORMAL LOW (ref 3.5–5.0)
Alkaline Phosphatase: 52 U/L (ref 38–126)
Anion gap: 7 (ref 5–15)
BUN: 17 mg/dL (ref 6–20)
CO2: 31 mmol/L (ref 22–32)
Calcium: 8.7 mg/dL — ABNORMAL LOW (ref 8.9–10.3)
Chloride: 102 mmol/L (ref 101–111)
Creatinine, Ser: 0.86 mg/dL (ref 0.44–1.00)
GFR calc Af Amer: >60 mL/min (ref >60)
GFR calc non Af Amer: >60 mL/min (ref >60)
Glucose, Bld: 101 mg/dL — ABNORMAL HIGH (ref 65–99)
Potassium: 2.9 mmol/L — ABNORMAL LOW (ref 3.5–5.1)
Sodium: 140 mmol/L (ref 135–145)
Total Bilirubin: 0.7 mg/dL (ref 0.3–1.2)
Total Protein: 8 g/dL (ref 6.5–8.1)

## 2016-01-11 LAB — CBC WITH DIFFERENTIAL/PLATELET
Basophils Absolute: 0 10*3/uL (ref 0.0–0.1)
Basophils Relative: 0 %
Eosinophils Absolute: 0.1 10*3/uL (ref 0.0–0.7)
Eosinophils Relative: 1 %
HCT: 36 % (ref 36.0–46.0)
Hemoglobin: 11.2 g/dL — ABNORMAL LOW (ref 12.0–15.0)
Lymphocytes Relative: 20 %
Lymphs Abs: 1.6 10*3/uL (ref 0.7–4.0)
MCH: 28.6 pg (ref 26.0–34.0)
MCHC: 31.1 g/dL (ref 30.0–36.0)
MCV: 92.1 fL (ref 78.0–100.0)
Monocytes Absolute: 0.7 10*3/uL (ref 0.1–1.0)
Monocytes Relative: 9 %
Neutro Abs: 5.4 10*3/uL (ref 1.7–7.7)
Neutrophils Relative %: 70 %
Platelets: 308 10*3/uL (ref 150–400)
RBC: 3.91 MIL/uL (ref 3.87–5.11)
RDW: 15 % (ref 11.5–15.5)
WBC: 7.8 10*3/uL (ref 4.0–10.5)

## 2016-01-11 MED ORDER — DOXYCYCLINE HYCLATE 100 MG PO CAPS: 100.0000 mg | ORAL_CAPSULE | Freq: Two times a day (BID) | ORAL | Status: DC

## 2016-01-11 MED ORDER — POTASSIUM CHLORIDE CRYS ER 20 MEQ PO TBCR: 20.0000 meq | EXTENDED_RELEASE_TABLET | Freq: Every day | ORAL | Status: DC

## 2016-01-11 MED ORDER — POTASSIUM CHLORIDE CRYS ER 20 MEQ PO TBCR
40.0000 meq | EXTENDED_RELEASE_TABLET | Freq: Once | ORAL | Status: AC
  Administered 2016-01-11: 40 meq via ORAL
  Filled 2016-01-11: qty 2

## 2016-01-11 MED ORDER — HYDROCODONE-ACETAMINOPHEN 5-325 MG PO TABS
1.0000 | ORAL_TABLET | Freq: Once | ORAL | Status: AC
  Administered 2016-01-11: 1 via ORAL
  Filled 2016-01-11: qty 1

## 2016-01-11 NOTE — ED Notes (Signed)
Pt c/o ulcers to ble x 4 months, pt reports yellow drainage since Sunday. Yellow, foul smelling drainage noted. Pt denies fever/chills/n/v/d. C/m/s intact. Pt states she has not had wound care to legs in approximately 2 months d/t not having insurance.

## 2016-01-11 NOTE — Discharge Instructions (Signed)
Call the wound care center tomorrow for follow up this week

## 2016-01-11 NOTE — ED Notes (Signed)
Pt reports venous stasis ulcers on bilateral legs x4 months. Pt reports they began heavily draining since this morning.

## 2016-01-11 NOTE — ED Provider Notes (Signed)
CSN: [id number]     Arrival date & time 01/11/16  1451 History   First MD Initiated Contact with Patient 01/11/16 1634     Chief Complaint  Patient presents with  . Skin Ulcer     (Consider location/radiation/quality/duration/timing/severity/associated sxs/prior Treatment) Patient is a 55 y.o. female presenting with rash. The history is provided by the patient (Patient complains of ulcers to her both lower legs. This is chronic but she no longer gets care from them. Also some drainage from these ulcers).  Rash Location: Large draining ulcers to both lower legs with edema. Quality: blistering   Severity:  Moderate Onset quality:  Gradual Timing:  Constant Progression:  Worsening Chronicity:  Chronic Context: not animal contact   Associated symptoms: no abdominal pain, no diarrhea, no fatigue and no headaches     Past Medical History  Diagnosis Date  . Hypertension   . Depression   . Asthma   . Ulcer     left posterior calf  . Headache(784.0)   . Hyperlipidemia   . Lung nodules   . Sarcoidosis ([place])   . CHF (congestive heart failure) ([place]) 03/12/2014  . Renal disorder    Past Surgical History  Procedure Laterality Date  . No past surgeries    . Cataract surgery Left 07-2013   Family History  Problem Relation Age of Onset  . Heart failure Mother   . Heart disease Father    Social History  Substance Use Topics  . Smoking status: Never Smoker   . Smokeless tobacco: Never Used  . Alcohol Use: No   OB History    No data available     Review of Systems  Constitutional: Negative for appetite change and fatigue.  HENT: Negative for congestion, ear discharge and sinus pressure.   Eyes: Negative for discharge.  Respiratory: Negative for cough.   Cardiovascular: Negative for chest pain.  Gastrointestinal: Negative for abdominal pain and diarrhea.  Genitourinary: Negative for frequency and hematuria.  Musculoskeletal: Negative for back pain.       Ulcers and  swelling both legs  Skin: Positive for rash.  Neurological: Negative for seizures and headaches.  Psychiatric/Behavioral: Negative for hallucinations.      Allergies  Review of patient's allergies indicates no known allergies.  Home Medications   Prior to Admission medications   Medication Sig Start Date End Date Taking? Authorizing Provider  albuterol (PROVENTIL HFA;VENTOLIN HFA) 108 (90 BASE) MCG/ACT inhaler Inhale 2 puffs into the lungs every 6 (six) hours as needed for shortness of breath.   Yes Historical Provider, MD  amLODipine (NORVASC) 5 MG tablet Take 5 mg by mouth every morning.    Yes Historical Provider, MD  aspirin EC 81 MG tablet Take 81 mg by mouth daily.   Yes Historical Provider, MD  furosemide (LASIX) 40 MG tablet Take 1 tablet (40 mg total) by mouth daily. 10/21/15  Yes Quintella Reichert, MD  ibuprofen (ADVIL,MOTRIN) 800 MG tablet Take 800 mg by mouth every 6 (six) hours as needed for moderate pain.   Yes Historical Provider, MD  Wound Dressings (SILVASORB) GEL Apply twice daily with dressing changes 10/21/15  Yes Quintella Reichert, MD  cephALEXin (KEFLEX) 500 MG capsule Take 1 capsule (500 mg total) by mouth 2 (two) times daily. Patient not taking: Reported on 01/11/2016 10/21/15   Quintella Reichert, MD  doxycycline (VIBRAMYCIN) 100 MG capsule Take 1 capsule (100 mg total) by mouth 2 (two) times daily. One po bid x 7 days 01/11/16  Milton Ferguson, MD  potassium chloride SA (K-DUR,KLOR-CON) 20 MEQ tablet Take 1 tablet (20 mEq total) by mouth daily. 01/11/16   Milton Ferguson, MD  traMADol (ULTRAM) 50 MG tablet Take 1 tablet (50 mg total) by mouth every 6 (six) hours as needed. Patient not taking: Reported on 01/11/2016 10/21/15   Quintella Reichert, MD   BP 158/95 mmHg  Pulse 83  Temp(Src) 98.3 F (36.8 C) (Oral)  Resp 18  Ht 5\' 7"  (1.702 m)  Wt 300 lb (136.079 kg)  BMI 46.98 kg/m2  SpO2 100%  LMP 11/21/2011 Physical Exam  Constitutional: She is oriented to person, place, and  time. She appears well-developed.  HENT:  Head: Normocephalic.  Eyes: Conjunctivae are normal.  Neck: No tracheal deviation present.  Cardiovascular:  No murmur heard. Musculoskeletal: Normal range of motion. She exhibits edema and tenderness.  Patient has draining large ulcers to both lower legs  Neurological: She is oriented to person, place, and time.  Skin: Skin is warm.  Psychiatric: She has a normal mood and affect.    ED Course  Procedures (including critical care time) Labs Review Labs Reviewed  CBC WITH DIFFERENTIAL/PLATELET - Abnormal; Notable for the following:    Hemoglobin 11.2 (*)    All other components within normal limits  COMPREHENSIVE METABOLIC PANEL - Abnormal; Notable for the following:    Potassium 2.9 (*)    Glucose, Bld 101 (*)    Calcium 8.7 (*)    Albumin 3.2 (*)    ALT 13 (*)    All other components within normal limits    Imaging Review No results found. I have personally reviewed and evaluated these images and lab results as part of my medical decision-making.   EKG Interpretation None      MDM   Final diagnoses:  Ulcers of both lower legs, limited to breakdown of skin (HCC)    Hypokalemia will treat with potassium. Ulcers to both lower legs draining possibly infected will start doxycycline have her follow-up with the wound care this week    Milton Ferguson, MD 01/11/16 [phone]

## 2016-03-02 DIAGNOSIS — E785 Hyperlipidemia, unspecified: Secondary | ICD-10-CM | POA: Diagnosis not present

## 2016-03-02 DIAGNOSIS — I10 Essential (primary) hypertension: Secondary | ICD-10-CM | POA: Diagnosis not present

## 2016-03-02 DIAGNOSIS — L03115 Cellulitis of right lower limb: Secondary | ICD-10-CM | POA: Diagnosis not present

## 2016-03-02 DIAGNOSIS — E1165 Type 2 diabetes mellitus with hyperglycemia: Secondary | ICD-10-CM | POA: Diagnosis not present

## 2016-03-02 DIAGNOSIS — L97904 Non-pressure chronic ulcer of unspecified part of unspecified lower leg with necrosis of bone: Secondary | ICD-10-CM | POA: Diagnosis not present

## 2016-03-06 DIAGNOSIS — L97912 Non-pressure chronic ulcer of unspecified part of right lower leg with fat layer exposed: Secondary | ICD-10-CM | POA: Diagnosis not present

## 2016-03-06 DIAGNOSIS — I872 Venous insufficiency (chronic) (peripheral): Secondary | ICD-10-CM | POA: Diagnosis not present

## 2016-03-08 DIAGNOSIS — I872 Venous insufficiency (chronic) (peripheral): Secondary | ICD-10-CM | POA: Diagnosis not present

## 2016-03-08 DIAGNOSIS — L97912 Non-pressure chronic ulcer of unspecified part of right lower leg with fat layer exposed: Secondary | ICD-10-CM | POA: Diagnosis not present

## 2016-03-12 DIAGNOSIS — L97912 Non-pressure chronic ulcer of unspecified part of right lower leg with fat layer exposed: Secondary | ICD-10-CM | POA: Diagnosis not present

## 2016-03-12 DIAGNOSIS — I872 Venous insufficiency (chronic) (peripheral): Secondary | ICD-10-CM | POA: Diagnosis not present

## 2016-03-14 DIAGNOSIS — L97912 Non-pressure chronic ulcer of unspecified part of right lower leg with fat layer exposed: Secondary | ICD-10-CM | POA: Diagnosis not present

## 2016-03-14 DIAGNOSIS — I872 Venous insufficiency (chronic) (peripheral): Secondary | ICD-10-CM | POA: Diagnosis not present

## 2016-03-15 DIAGNOSIS — I872 Venous insufficiency (chronic) (peripheral): Secondary | ICD-10-CM | POA: Diagnosis not present

## 2016-03-15 DIAGNOSIS — L97912 Non-pressure chronic ulcer of unspecified part of right lower leg with fat layer exposed: Secondary | ICD-10-CM | POA: Diagnosis not present

## 2016-03-19 DIAGNOSIS — I872 Venous insufficiency (chronic) (peripheral): Secondary | ICD-10-CM | POA: Diagnosis not present

## 2016-03-19 DIAGNOSIS — L97912 Non-pressure chronic ulcer of unspecified part of right lower leg with fat layer exposed: Secondary | ICD-10-CM | POA: Diagnosis not present

## 2016-03-23 DIAGNOSIS — I872 Venous insufficiency (chronic) (peripheral): Secondary | ICD-10-CM | POA: Diagnosis not present

## 2016-03-23 DIAGNOSIS — L97912 Non-pressure chronic ulcer of unspecified part of right lower leg with fat layer exposed: Secondary | ICD-10-CM | POA: Diagnosis not present

## 2016-03-27 DIAGNOSIS — L97912 Non-pressure chronic ulcer of unspecified part of right lower leg with fat layer exposed: Secondary | ICD-10-CM | POA: Diagnosis not present

## 2016-03-27 DIAGNOSIS — I872 Venous insufficiency (chronic) (peripheral): Secondary | ICD-10-CM | POA: Diagnosis not present

## 2016-03-28 DIAGNOSIS — I872 Venous insufficiency (chronic) (peripheral): Secondary | ICD-10-CM | POA: Diagnosis not present

## 2016-03-28 DIAGNOSIS — L97912 Non-pressure chronic ulcer of unspecified part of right lower leg with fat layer exposed: Secondary | ICD-10-CM | POA: Diagnosis not present

## 2016-03-30 DIAGNOSIS — I872 Venous insufficiency (chronic) (peripheral): Secondary | ICD-10-CM | POA: Diagnosis not present

## 2016-03-30 DIAGNOSIS — L97912 Non-pressure chronic ulcer of unspecified part of right lower leg with fat layer exposed: Secondary | ICD-10-CM | POA: Diagnosis not present

## 2016-03-31 DIAGNOSIS — L97912 Non-pressure chronic ulcer of unspecified part of right lower leg with fat layer exposed: Secondary | ICD-10-CM | POA: Diagnosis not present

## 2016-03-31 DIAGNOSIS — I872 Venous insufficiency (chronic) (peripheral): Secondary | ICD-10-CM | POA: Diagnosis not present

## 2016-04-03 DIAGNOSIS — L97912 Non-pressure chronic ulcer of unspecified part of right lower leg with fat layer exposed: Secondary | ICD-10-CM | POA: Diagnosis not present

## 2016-04-03 DIAGNOSIS — I872 Venous insufficiency (chronic) (peripheral): Secondary | ICD-10-CM | POA: Diagnosis not present

## 2016-04-04 DIAGNOSIS — L97912 Non-pressure chronic ulcer of unspecified part of right lower leg with fat layer exposed: Secondary | ICD-10-CM | POA: Diagnosis not present

## 2016-04-04 DIAGNOSIS — I872 Venous insufficiency (chronic) (peripheral): Secondary | ICD-10-CM | POA: Diagnosis not present

## 2016-04-05 DIAGNOSIS — I872 Venous insufficiency (chronic) (peripheral): Secondary | ICD-10-CM | POA: Diagnosis not present

## 2016-04-05 DIAGNOSIS — L97912 Non-pressure chronic ulcer of unspecified part of right lower leg with fat layer exposed: Secondary | ICD-10-CM | POA: Diagnosis not present

## 2016-04-06 DIAGNOSIS — I872 Venous insufficiency (chronic) (peripheral): Secondary | ICD-10-CM | POA: Diagnosis not present

## 2016-04-06 DIAGNOSIS — L97912 Non-pressure chronic ulcer of unspecified part of right lower leg with fat layer exposed: Secondary | ICD-10-CM | POA: Diagnosis not present

## 2016-04-10 DIAGNOSIS — L97912 Non-pressure chronic ulcer of unspecified part of right lower leg with fat layer exposed: Secondary | ICD-10-CM | POA: Diagnosis not present

## 2016-04-10 DIAGNOSIS — I872 Venous insufficiency (chronic) (peripheral): Secondary | ICD-10-CM | POA: Diagnosis not present

## 2016-04-11 DIAGNOSIS — I872 Venous insufficiency (chronic) (peripheral): Secondary | ICD-10-CM | POA: Diagnosis not present

## 2016-04-11 DIAGNOSIS — L97912 Non-pressure chronic ulcer of unspecified part of right lower leg with fat layer exposed: Secondary | ICD-10-CM | POA: Diagnosis not present

## 2016-04-12 DIAGNOSIS — L97912 Non-pressure chronic ulcer of unspecified part of right lower leg with fat layer exposed: Secondary | ICD-10-CM | POA: Diagnosis not present

## 2016-04-12 DIAGNOSIS — I872 Venous insufficiency (chronic) (peripheral): Secondary | ICD-10-CM | POA: Diagnosis not present

## 2016-04-13 DIAGNOSIS — I872 Venous insufficiency (chronic) (peripheral): Secondary | ICD-10-CM | POA: Diagnosis not present

## 2016-04-13 DIAGNOSIS — L97912 Non-pressure chronic ulcer of unspecified part of right lower leg with fat layer exposed: Secondary | ICD-10-CM | POA: Diagnosis not present

## 2016-04-18 DIAGNOSIS — L97912 Non-pressure chronic ulcer of unspecified part of right lower leg with fat layer exposed: Secondary | ICD-10-CM | POA: Diagnosis not present

## 2016-04-18 DIAGNOSIS — I872 Venous insufficiency (chronic) (peripheral): Secondary | ICD-10-CM | POA: Diagnosis not present

## 2016-04-19 DIAGNOSIS — L97912 Non-pressure chronic ulcer of unspecified part of right lower leg with fat layer exposed: Secondary | ICD-10-CM | POA: Diagnosis not present

## 2016-04-19 DIAGNOSIS — I872 Venous insufficiency (chronic) (peripheral): Secondary | ICD-10-CM | POA: Diagnosis not present

## 2016-04-23 DIAGNOSIS — L97912 Non-pressure chronic ulcer of unspecified part of right lower leg with fat layer exposed: Secondary | ICD-10-CM | POA: Diagnosis not present

## 2016-04-23 DIAGNOSIS — I872 Venous insufficiency (chronic) (peripheral): Secondary | ICD-10-CM | POA: Diagnosis not present

## 2016-04-26 DIAGNOSIS — I872 Venous insufficiency (chronic) (peripheral): Secondary | ICD-10-CM | POA: Diagnosis not present

## 2016-04-26 DIAGNOSIS — L97912 Non-pressure chronic ulcer of unspecified part of right lower leg with fat layer exposed: Secondary | ICD-10-CM | POA: Diagnosis not present

## 2016-05-01 DIAGNOSIS — I872 Venous insufficiency (chronic) (peripheral): Secondary | ICD-10-CM | POA: Diagnosis not present

## 2016-05-01 DIAGNOSIS — L97912 Non-pressure chronic ulcer of unspecified part of right lower leg with fat layer exposed: Secondary | ICD-10-CM | POA: Diagnosis not present

## 2016-05-03 DIAGNOSIS — L97912 Non-pressure chronic ulcer of unspecified part of right lower leg with fat layer exposed: Secondary | ICD-10-CM | POA: Diagnosis not present

## 2016-05-03 DIAGNOSIS — I872 Venous insufficiency (chronic) (peripheral): Secondary | ICD-10-CM | POA: Diagnosis not present

## 2016-05-04 DIAGNOSIS — I87313 Chronic venous hypertension (idiopathic) with ulcer of bilateral lower extremity: Secondary | ICD-10-CM | POA: Diagnosis not present

## 2016-05-04 DIAGNOSIS — I10 Essential (primary) hypertension: Secondary | ICD-10-CM | POA: Diagnosis not present

## 2016-05-04 DIAGNOSIS — I890 Lymphedema, not elsewhere classified: Secondary | ICD-10-CM | POA: Diagnosis not present

## 2016-05-05 DIAGNOSIS — L97912 Non-pressure chronic ulcer of unspecified part of right lower leg with fat layer exposed: Secondary | ICD-10-CM | POA: Diagnosis not present

## 2016-05-05 DIAGNOSIS — I872 Venous insufficiency (chronic) (peripheral): Secondary | ICD-10-CM | POA: Diagnosis not present

## 2016-05-08 DIAGNOSIS — L97912 Non-pressure chronic ulcer of unspecified part of right lower leg with fat layer exposed: Secondary | ICD-10-CM | POA: Diagnosis not present

## 2016-05-08 DIAGNOSIS — I872 Venous insufficiency (chronic) (peripheral): Secondary | ICD-10-CM | POA: Diagnosis not present

## 2016-05-09 DIAGNOSIS — I872 Venous insufficiency (chronic) (peripheral): Secondary | ICD-10-CM | POA: Diagnosis not present

## 2016-05-09 DIAGNOSIS — L97912 Non-pressure chronic ulcer of unspecified part of right lower leg with fat layer exposed: Secondary | ICD-10-CM | POA: Diagnosis not present

## 2016-05-10 DIAGNOSIS — L97912 Non-pressure chronic ulcer of unspecified part of right lower leg with fat layer exposed: Secondary | ICD-10-CM | POA: Diagnosis not present

## 2016-05-10 DIAGNOSIS — I872 Venous insufficiency (chronic) (peripheral): Secondary | ICD-10-CM | POA: Diagnosis not present

## 2016-05-15 DIAGNOSIS — I872 Venous insufficiency (chronic) (peripheral): Secondary | ICD-10-CM | POA: Diagnosis not present

## 2016-05-15 DIAGNOSIS — L97912 Non-pressure chronic ulcer of unspecified part of right lower leg with fat layer exposed: Secondary | ICD-10-CM | POA: Diagnosis not present

## 2016-05-17 DIAGNOSIS — L97912 Non-pressure chronic ulcer of unspecified part of right lower leg with fat layer exposed: Secondary | ICD-10-CM | POA: Diagnosis not present

## 2016-05-17 DIAGNOSIS — I872 Venous insufficiency (chronic) (peripheral): Secondary | ICD-10-CM | POA: Diagnosis not present

## 2016-05-18 DIAGNOSIS — L97912 Non-pressure chronic ulcer of unspecified part of right lower leg with fat layer exposed: Secondary | ICD-10-CM | POA: Diagnosis not present

## 2016-05-18 DIAGNOSIS — I872 Venous insufficiency (chronic) (peripheral): Secondary | ICD-10-CM | POA: Diagnosis not present

## 2016-05-22 DIAGNOSIS — I872 Venous insufficiency (chronic) (peripheral): Secondary | ICD-10-CM | POA: Diagnosis not present

## 2016-05-22 DIAGNOSIS — L97912 Non-pressure chronic ulcer of unspecified part of right lower leg with fat layer exposed: Secondary | ICD-10-CM | POA: Diagnosis not present

## 2016-05-23 DIAGNOSIS — L97912 Non-pressure chronic ulcer of unspecified part of right lower leg with fat layer exposed: Secondary | ICD-10-CM | POA: Diagnosis not present

## 2016-05-23 DIAGNOSIS — I872 Venous insufficiency (chronic) (peripheral): Secondary | ICD-10-CM | POA: Diagnosis not present

## 2016-05-25 DIAGNOSIS — L97912 Non-pressure chronic ulcer of unspecified part of right lower leg with fat layer exposed: Secondary | ICD-10-CM | POA: Diagnosis not present

## 2016-05-25 DIAGNOSIS — I872 Venous insufficiency (chronic) (peripheral): Secondary | ICD-10-CM | POA: Diagnosis not present

## 2016-05-28 DIAGNOSIS — I87313 Chronic venous hypertension (idiopathic) with ulcer of bilateral lower extremity: Secondary | ICD-10-CM | POA: Diagnosis not present

## 2016-05-29 DIAGNOSIS — L97912 Non-pressure chronic ulcer of unspecified part of right lower leg with fat layer exposed: Secondary | ICD-10-CM | POA: Diagnosis not present

## 2016-05-29 DIAGNOSIS — I872 Venous insufficiency (chronic) (peripheral): Secondary | ICD-10-CM | POA: Diagnosis not present

## 2016-06-01 DIAGNOSIS — L97912 Non-pressure chronic ulcer of unspecified part of right lower leg with fat layer exposed: Secondary | ICD-10-CM | POA: Diagnosis not present

## 2016-06-01 DIAGNOSIS — I872 Venous insufficiency (chronic) (peripheral): Secondary | ICD-10-CM | POA: Diagnosis not present

## 2016-06-05 DIAGNOSIS — I872 Venous insufficiency (chronic) (peripheral): Secondary | ICD-10-CM | POA: Diagnosis not present

## 2016-06-05 DIAGNOSIS — L97912 Non-pressure chronic ulcer of unspecified part of right lower leg with fat layer exposed: Secondary | ICD-10-CM | POA: Diagnosis not present

## 2016-06-08 DIAGNOSIS — L97912 Non-pressure chronic ulcer of unspecified part of right lower leg with fat layer exposed: Secondary | ICD-10-CM | POA: Diagnosis not present

## 2016-06-08 DIAGNOSIS — I872 Venous insufficiency (chronic) (peripheral): Secondary | ICD-10-CM | POA: Diagnosis not present

## 2016-06-12 DIAGNOSIS — L97912 Non-pressure chronic ulcer of unspecified part of right lower leg with fat layer exposed: Secondary | ICD-10-CM | POA: Diagnosis not present

## 2016-06-12 DIAGNOSIS — I872 Venous insufficiency (chronic) (peripheral): Secondary | ICD-10-CM | POA: Diagnosis not present

## 2016-06-15 DIAGNOSIS — I872 Venous insufficiency (chronic) (peripheral): Secondary | ICD-10-CM | POA: Diagnosis not present

## 2016-06-15 DIAGNOSIS — L97912 Non-pressure chronic ulcer of unspecified part of right lower leg with fat layer exposed: Secondary | ICD-10-CM | POA: Diagnosis not present

## 2016-06-19 DIAGNOSIS — I872 Venous insufficiency (chronic) (peripheral): Secondary | ICD-10-CM | POA: Diagnosis not present

## 2016-06-19 DIAGNOSIS — L97912 Non-pressure chronic ulcer of unspecified part of right lower leg with fat layer exposed: Secondary | ICD-10-CM | POA: Diagnosis not present

## 2016-06-22 DIAGNOSIS — I872 Venous insufficiency (chronic) (peripheral): Secondary | ICD-10-CM | POA: Diagnosis not present

## 2016-06-22 DIAGNOSIS — L97912 Non-pressure chronic ulcer of unspecified part of right lower leg with fat layer exposed: Secondary | ICD-10-CM | POA: Diagnosis not present

## 2016-06-25 DIAGNOSIS — I872 Venous insufficiency (chronic) (peripheral): Secondary | ICD-10-CM | POA: Diagnosis not present

## 2016-06-25 DIAGNOSIS — L97912 Non-pressure chronic ulcer of unspecified part of right lower leg with fat layer exposed: Secondary | ICD-10-CM | POA: Diagnosis not present

## 2016-06-29 DIAGNOSIS — L97912 Non-pressure chronic ulcer of unspecified part of right lower leg with fat layer exposed: Secondary | ICD-10-CM | POA: Diagnosis not present

## 2016-06-29 DIAGNOSIS — I872 Venous insufficiency (chronic) (peripheral): Secondary | ICD-10-CM | POA: Diagnosis not present

## 2016-07-02 DIAGNOSIS — L97912 Non-pressure chronic ulcer of unspecified part of right lower leg with fat layer exposed: Secondary | ICD-10-CM | POA: Diagnosis not present

## 2016-07-02 DIAGNOSIS — I872 Venous insufficiency (chronic) (peripheral): Secondary | ICD-10-CM | POA: Diagnosis not present

## 2016-07-04 DIAGNOSIS — L97912 Non-pressure chronic ulcer of unspecified part of right lower leg with fat layer exposed: Secondary | ICD-10-CM | POA: Diagnosis not present

## 2016-07-04 DIAGNOSIS — I872 Venous insufficiency (chronic) (peripheral): Secondary | ICD-10-CM | POA: Diagnosis not present

## 2016-07-06 DIAGNOSIS — L97912 Non-pressure chronic ulcer of unspecified part of right lower leg with fat layer exposed: Secondary | ICD-10-CM | POA: Diagnosis not present

## 2016-07-06 DIAGNOSIS — I872 Venous insufficiency (chronic) (peripheral): Secondary | ICD-10-CM | POA: Diagnosis not present

## 2016-07-09 DIAGNOSIS — I872 Venous insufficiency (chronic) (peripheral): Secondary | ICD-10-CM | POA: Diagnosis not present

## 2016-07-09 DIAGNOSIS — L97912 Non-pressure chronic ulcer of unspecified part of right lower leg with fat layer exposed: Secondary | ICD-10-CM | POA: Diagnosis not present

## 2016-07-11 DIAGNOSIS — L97912 Non-pressure chronic ulcer of unspecified part of right lower leg with fat layer exposed: Secondary | ICD-10-CM | POA: Diagnosis not present

## 2016-07-11 DIAGNOSIS — I872 Venous insufficiency (chronic) (peripheral): Secondary | ICD-10-CM | POA: Diagnosis not present

## 2016-07-17 DIAGNOSIS — I872 Venous insufficiency (chronic) (peripheral): Secondary | ICD-10-CM | POA: Diagnosis not present

## 2016-07-17 DIAGNOSIS — L97912 Non-pressure chronic ulcer of unspecified part of right lower leg with fat layer exposed: Secondary | ICD-10-CM | POA: Diagnosis not present

## 2016-07-19 DIAGNOSIS — I872 Venous insufficiency (chronic) (peripheral): Secondary | ICD-10-CM | POA: Diagnosis not present

## 2016-07-20 DIAGNOSIS — L97912 Non-pressure chronic ulcer of unspecified part of right lower leg with fat layer exposed: Secondary | ICD-10-CM | POA: Diagnosis not present

## 2016-07-20 DIAGNOSIS — I872 Venous insufficiency (chronic) (peripheral): Secondary | ICD-10-CM | POA: Diagnosis not present

## 2016-07-23 DIAGNOSIS — L97912 Non-pressure chronic ulcer of unspecified part of right lower leg with fat layer exposed: Secondary | ICD-10-CM | POA: Diagnosis not present

## 2016-07-23 DIAGNOSIS — I872 Venous insufficiency (chronic) (peripheral): Secondary | ICD-10-CM | POA: Diagnosis not present

## 2016-07-26 DIAGNOSIS — I872 Venous insufficiency (chronic) (peripheral): Secondary | ICD-10-CM | POA: Diagnosis not present

## 2016-07-26 DIAGNOSIS — L97912 Non-pressure chronic ulcer of unspecified part of right lower leg with fat layer exposed: Secondary | ICD-10-CM | POA: Diagnosis not present

## 2016-07-31 DIAGNOSIS — I872 Venous insufficiency (chronic) (peripheral): Secondary | ICD-10-CM | POA: Diagnosis not present

## 2016-07-31 DIAGNOSIS — L97912 Non-pressure chronic ulcer of unspecified part of right lower leg with fat layer exposed: Secondary | ICD-10-CM | POA: Diagnosis not present

## 2016-08-03 DIAGNOSIS — I872 Venous insufficiency (chronic) (peripheral): Secondary | ICD-10-CM | POA: Diagnosis not present

## 2016-08-03 DIAGNOSIS — L97912 Non-pressure chronic ulcer of unspecified part of right lower leg with fat layer exposed: Secondary | ICD-10-CM | POA: Diagnosis not present

## 2016-08-06 DIAGNOSIS — L97912 Non-pressure chronic ulcer of unspecified part of right lower leg with fat layer exposed: Secondary | ICD-10-CM | POA: Diagnosis not present

## 2016-08-06 DIAGNOSIS — I872 Venous insufficiency (chronic) (peripheral): Secondary | ICD-10-CM | POA: Diagnosis not present

## 2016-08-08 DIAGNOSIS — I872 Venous insufficiency (chronic) (peripheral): Secondary | ICD-10-CM | POA: Diagnosis not present

## 2016-08-08 DIAGNOSIS — L97912 Non-pressure chronic ulcer of unspecified part of right lower leg with fat layer exposed: Secondary | ICD-10-CM | POA: Diagnosis not present

## 2016-08-14 DIAGNOSIS — I872 Venous insufficiency (chronic) (peripheral): Secondary | ICD-10-CM | POA: Diagnosis not present

## 2016-08-14 DIAGNOSIS — L97912 Non-pressure chronic ulcer of unspecified part of right lower leg with fat layer exposed: Secondary | ICD-10-CM | POA: Diagnosis not present

## 2016-08-17 DIAGNOSIS — L97912 Non-pressure chronic ulcer of unspecified part of right lower leg with fat layer exposed: Secondary | ICD-10-CM | POA: Diagnosis not present

## 2016-08-17 DIAGNOSIS — I872 Venous insufficiency (chronic) (peripheral): Secondary | ICD-10-CM | POA: Diagnosis not present

## 2016-08-20 DIAGNOSIS — L97912 Non-pressure chronic ulcer of unspecified part of right lower leg with fat layer exposed: Secondary | ICD-10-CM | POA: Diagnosis not present

## 2016-08-20 DIAGNOSIS — I872 Venous insufficiency (chronic) (peripheral): Secondary | ICD-10-CM | POA: Diagnosis not present

## 2016-08-23 DIAGNOSIS — L97912 Non-pressure chronic ulcer of unspecified part of right lower leg with fat layer exposed: Secondary | ICD-10-CM | POA: Diagnosis not present

## 2016-08-23 DIAGNOSIS — I872 Venous insufficiency (chronic) (peripheral): Secondary | ICD-10-CM | POA: Diagnosis not present

## 2016-08-28 DIAGNOSIS — L97912 Non-pressure chronic ulcer of unspecified part of right lower leg with fat layer exposed: Secondary | ICD-10-CM | POA: Diagnosis not present

## 2016-08-28 DIAGNOSIS — I872 Venous insufficiency (chronic) (peripheral): Secondary | ICD-10-CM | POA: Diagnosis not present

## 2016-08-31 DIAGNOSIS — I872 Venous insufficiency (chronic) (peripheral): Secondary | ICD-10-CM | POA: Diagnosis not present

## 2016-08-31 DIAGNOSIS — L97912 Non-pressure chronic ulcer of unspecified part of right lower leg with fat layer exposed: Secondary | ICD-10-CM | POA: Diagnosis not present

## 2016-09-02 DIAGNOSIS — I872 Venous insufficiency (chronic) (peripheral): Secondary | ICD-10-CM | POA: Diagnosis not present

## 2016-09-02 DIAGNOSIS — L97912 Non-pressure chronic ulcer of unspecified part of right lower leg with fat layer exposed: Secondary | ICD-10-CM | POA: Diagnosis not present

## 2016-09-04 DIAGNOSIS — I872 Venous insufficiency (chronic) (peripheral): Secondary | ICD-10-CM | POA: Diagnosis not present

## 2016-09-04 DIAGNOSIS — L97912 Non-pressure chronic ulcer of unspecified part of right lower leg with fat layer exposed: Secondary | ICD-10-CM | POA: Diagnosis not present

## 2016-09-07 DIAGNOSIS — L97912 Non-pressure chronic ulcer of unspecified part of right lower leg with fat layer exposed: Secondary | ICD-10-CM | POA: Diagnosis not present

## 2016-09-07 DIAGNOSIS — I872 Venous insufficiency (chronic) (peripheral): Secondary | ICD-10-CM | POA: Diagnosis not present

## 2016-09-10 DIAGNOSIS — L97912 Non-pressure chronic ulcer of unspecified part of right lower leg with fat layer exposed: Secondary | ICD-10-CM | POA: Diagnosis not present

## 2016-09-10 DIAGNOSIS — I872 Venous insufficiency (chronic) (peripheral): Secondary | ICD-10-CM | POA: Diagnosis not present

## 2016-09-13 DIAGNOSIS — I872 Venous insufficiency (chronic) (peripheral): Secondary | ICD-10-CM | POA: Diagnosis not present

## 2016-09-13 DIAGNOSIS — L97912 Non-pressure chronic ulcer of unspecified part of right lower leg with fat layer exposed: Secondary | ICD-10-CM | POA: Diagnosis not present

## 2016-09-17 DIAGNOSIS — I872 Venous insufficiency (chronic) (peripheral): Secondary | ICD-10-CM | POA: Diagnosis not present

## 2016-09-17 DIAGNOSIS — L97912 Non-pressure chronic ulcer of unspecified part of right lower leg with fat layer exposed: Secondary | ICD-10-CM | POA: Diagnosis not present

## 2016-09-20 DIAGNOSIS — I872 Venous insufficiency (chronic) (peripheral): Secondary | ICD-10-CM | POA: Diagnosis not present

## 2016-09-20 DIAGNOSIS — L97912 Non-pressure chronic ulcer of unspecified part of right lower leg with fat layer exposed: Secondary | ICD-10-CM | POA: Diagnosis not present

## 2016-09-24 DIAGNOSIS — L97912 Non-pressure chronic ulcer of unspecified part of right lower leg with fat layer exposed: Secondary | ICD-10-CM | POA: Diagnosis not present

## 2016-09-24 DIAGNOSIS — I872 Venous insufficiency (chronic) (peripheral): Secondary | ICD-10-CM | POA: Diagnosis not present

## 2016-09-26 DIAGNOSIS — L97912 Non-pressure chronic ulcer of unspecified part of right lower leg with fat layer exposed: Secondary | ICD-10-CM | POA: Diagnosis not present

## 2016-09-26 DIAGNOSIS — I872 Venous insufficiency (chronic) (peripheral): Secondary | ICD-10-CM | POA: Diagnosis not present

## 2016-10-02 DIAGNOSIS — I872 Venous insufficiency (chronic) (peripheral): Secondary | ICD-10-CM | POA: Diagnosis not present

## 2016-10-02 DIAGNOSIS — L97912 Non-pressure chronic ulcer of unspecified part of right lower leg with fat layer exposed: Secondary | ICD-10-CM | POA: Diagnosis not present

## 2016-10-04 DIAGNOSIS — I872 Venous insufficiency (chronic) (peripheral): Secondary | ICD-10-CM | POA: Diagnosis not present

## 2016-10-04 DIAGNOSIS — L97912 Non-pressure chronic ulcer of unspecified part of right lower leg with fat layer exposed: Secondary | ICD-10-CM | POA: Diagnosis not present

## 2016-10-08 DIAGNOSIS — I872 Venous insufficiency (chronic) (peripheral): Secondary | ICD-10-CM | POA: Diagnosis not present

## 2016-10-08 DIAGNOSIS — L97912 Non-pressure chronic ulcer of unspecified part of right lower leg with fat layer exposed: Secondary | ICD-10-CM | POA: Diagnosis not present

## 2016-10-10 DIAGNOSIS — I872 Venous insufficiency (chronic) (peripheral): Secondary | ICD-10-CM | POA: Diagnosis not present

## 2016-10-10 DIAGNOSIS — L97912 Non-pressure chronic ulcer of unspecified part of right lower leg with fat layer exposed: Secondary | ICD-10-CM | POA: Diagnosis not present

## 2016-10-16 DIAGNOSIS — I872 Venous insufficiency (chronic) (peripheral): Secondary | ICD-10-CM | POA: Diagnosis not present

## 2016-10-16 DIAGNOSIS — L97912 Non-pressure chronic ulcer of unspecified part of right lower leg with fat layer exposed: Secondary | ICD-10-CM | POA: Diagnosis not present

## 2016-10-19 DIAGNOSIS — L97909 Non-pressure chronic ulcer of unspecified part of unspecified lower leg with unspecified severity: Secondary | ICD-10-CM | POA: Diagnosis not present

## 2016-10-19 DIAGNOSIS — I10 Essential (primary) hypertension: Secondary | ICD-10-CM | POA: Diagnosis not present

## 2016-10-19 DIAGNOSIS — Z23 Encounter for immunization: Secondary | ICD-10-CM | POA: Diagnosis not present

## 2016-10-23 DIAGNOSIS — I872 Venous insufficiency (chronic) (peripheral): Secondary | ICD-10-CM | POA: Diagnosis not present

## 2016-10-23 DIAGNOSIS — L97912 Non-pressure chronic ulcer of unspecified part of right lower leg with fat layer exposed: Secondary | ICD-10-CM | POA: Diagnosis not present

## 2016-10-25 DIAGNOSIS — I872 Venous insufficiency (chronic) (peripheral): Secondary | ICD-10-CM | POA: Diagnosis not present

## 2016-10-25 DIAGNOSIS — L97912 Non-pressure chronic ulcer of unspecified part of right lower leg with fat layer exposed: Secondary | ICD-10-CM | POA: Diagnosis not present

## 2016-10-30 DIAGNOSIS — L97912 Non-pressure chronic ulcer of unspecified part of right lower leg with fat layer exposed: Secondary | ICD-10-CM | POA: Diagnosis not present

## 2016-10-30 DIAGNOSIS — I872 Venous insufficiency (chronic) (peripheral): Secondary | ICD-10-CM | POA: Diagnosis not present

## 2016-11-01 DIAGNOSIS — I872 Venous insufficiency (chronic) (peripheral): Secondary | ICD-10-CM | POA: Diagnosis not present

## 2016-11-01 DIAGNOSIS — L97912 Non-pressure chronic ulcer of unspecified part of right lower leg with fat layer exposed: Secondary | ICD-10-CM | POA: Diagnosis not present

## 2016-11-02 DIAGNOSIS — L97912 Non-pressure chronic ulcer of unspecified part of right lower leg with fat layer exposed: Secondary | ICD-10-CM | POA: Diagnosis not present

## 2016-11-02 DIAGNOSIS — I872 Venous insufficiency (chronic) (peripheral): Secondary | ICD-10-CM | POA: Diagnosis not present

## 2016-11-07 DIAGNOSIS — I872 Venous insufficiency (chronic) (peripheral): Secondary | ICD-10-CM | POA: Diagnosis not present

## 2016-11-07 DIAGNOSIS — L97912 Non-pressure chronic ulcer of unspecified part of right lower leg with fat layer exposed: Secondary | ICD-10-CM | POA: Diagnosis not present

## 2016-11-09 DIAGNOSIS — L97912 Non-pressure chronic ulcer of unspecified part of right lower leg with fat layer exposed: Secondary | ICD-10-CM | POA: Diagnosis not present

## 2016-11-09 DIAGNOSIS — I872 Venous insufficiency (chronic) (peripheral): Secondary | ICD-10-CM | POA: Diagnosis not present

## 2016-11-13 DIAGNOSIS — I872 Venous insufficiency (chronic) (peripheral): Secondary | ICD-10-CM | POA: Diagnosis not present

## 2016-11-13 DIAGNOSIS — L97912 Non-pressure chronic ulcer of unspecified part of right lower leg with fat layer exposed: Secondary | ICD-10-CM | POA: Diagnosis not present

## 2016-11-20 DIAGNOSIS — L97912 Non-pressure chronic ulcer of unspecified part of right lower leg with fat layer exposed: Secondary | ICD-10-CM | POA: Diagnosis not present

## 2016-11-20 DIAGNOSIS — I872 Venous insufficiency (chronic) (peripheral): Secondary | ICD-10-CM | POA: Diagnosis not present

## 2016-11-23 DIAGNOSIS — I872 Venous insufficiency (chronic) (peripheral): Secondary | ICD-10-CM | POA: Diagnosis not present

## 2016-11-23 DIAGNOSIS — L97912 Non-pressure chronic ulcer of unspecified part of right lower leg with fat layer exposed: Secondary | ICD-10-CM | POA: Diagnosis not present

## 2016-11-26 DIAGNOSIS — I872 Venous insufficiency (chronic) (peripheral): Secondary | ICD-10-CM | POA: Diagnosis not present

## 2016-11-26 DIAGNOSIS — L97912 Non-pressure chronic ulcer of unspecified part of right lower leg with fat layer exposed: Secondary | ICD-10-CM | POA: Diagnosis not present

## 2016-11-30 DIAGNOSIS — L97912 Non-pressure chronic ulcer of unspecified part of right lower leg with fat layer exposed: Secondary | ICD-10-CM | POA: Diagnosis not present

## 2016-11-30 DIAGNOSIS — I872 Venous insufficiency (chronic) (peripheral): Secondary | ICD-10-CM | POA: Diagnosis not present

## 2016-12-03 DIAGNOSIS — I872 Venous insufficiency (chronic) (peripheral): Secondary | ICD-10-CM | POA: Diagnosis not present

## 2016-12-03 DIAGNOSIS — L97912 Non-pressure chronic ulcer of unspecified part of right lower leg with fat layer exposed: Secondary | ICD-10-CM | POA: Diagnosis not present

## 2016-12-10 DIAGNOSIS — L97912 Non-pressure chronic ulcer of unspecified part of right lower leg with fat layer exposed: Secondary | ICD-10-CM | POA: Diagnosis not present

## 2016-12-10 DIAGNOSIS — I872 Venous insufficiency (chronic) (peripheral): Secondary | ICD-10-CM | POA: Diagnosis not present

## 2016-12-14 DIAGNOSIS — L97912 Non-pressure chronic ulcer of unspecified part of right lower leg with fat layer exposed: Secondary | ICD-10-CM | POA: Diagnosis not present

## 2016-12-14 DIAGNOSIS — I872 Venous insufficiency (chronic) (peripheral): Secondary | ICD-10-CM | POA: Diagnosis not present

## 2016-12-17 ENCOUNTER — Encounter (HOSPITAL_COMMUNITY): Payer: Self-pay | Admitting: *Deleted

## 2016-12-17 ENCOUNTER — Inpatient Hospital Stay (HOSPITAL_COMMUNITY)
Admission: EM | Admit: 2016-12-17 | Discharge: 2016-12-19 | DRG: 674 | Disposition: A | Discharge status: Home Health | Payer: Medicare Other | Attending: Internal Medicine | Admitting: Internal Medicine

## 2016-12-17 DIAGNOSIS — L97211 Non-pressure chronic ulcer of right calf limited to breakdown of skin: Secondary | ICD-10-CM | POA: Diagnosis present

## 2016-12-17 DIAGNOSIS — N182 Chronic kidney disease, stage 2 (mild): Secondary | ICD-10-CM | POA: Diagnosis present

## 2016-12-17 DIAGNOSIS — L03116 Cellulitis of left lower limb: Secondary | ICD-10-CM | POA: Diagnosis present

## 2016-12-17 DIAGNOSIS — Z79899 Other long term (current) drug therapy: Secondary | ICD-10-CM

## 2016-12-17 DIAGNOSIS — L03115 Cellulitis of right lower limb: Secondary | ICD-10-CM | POA: Diagnosis not present

## 2016-12-17 DIAGNOSIS — E860 Dehydration: Secondary | ICD-10-CM | POA: Diagnosis present

## 2016-12-17 DIAGNOSIS — Z833 Family history of diabetes mellitus: Secondary | ICD-10-CM

## 2016-12-17 DIAGNOSIS — J45909 Unspecified asthma, uncomplicated: Secondary | ICD-10-CM | POA: Diagnosis present

## 2016-12-17 DIAGNOSIS — E1165 Type 2 diabetes mellitus with hyperglycemia: Secondary | ICD-10-CM | POA: Diagnosis present

## 2016-12-17 DIAGNOSIS — I509 Heart failure, unspecified: Secondary | ICD-10-CM | POA: Diagnosis present

## 2016-12-17 DIAGNOSIS — I130 Hypertensive heart and chronic kidney disease with heart failure and stage 1 through stage 4 chronic kidney disease, or unspecified chronic kidney disease: Secondary | ICD-10-CM | POA: Diagnosis present

## 2016-12-17 DIAGNOSIS — R55 Syncope and collapse: Secondary | ICD-10-CM | POA: Diagnosis present

## 2016-12-17 DIAGNOSIS — D869 Sarcoidosis, unspecified: Secondary | ICD-10-CM | POA: Diagnosis present

## 2016-12-17 DIAGNOSIS — I878 Other specified disorders of veins: Secondary | ICD-10-CM | POA: Diagnosis present

## 2016-12-17 DIAGNOSIS — R609 Edema, unspecified: Secondary | ICD-10-CM | POA: Diagnosis present

## 2016-12-17 DIAGNOSIS — D638 Anemia in other chronic diseases classified elsewhere: Secondary | ICD-10-CM | POA: Diagnosis present

## 2016-12-17 DIAGNOSIS — E785 Hyperlipidemia, unspecified: Secondary | ICD-10-CM | POA: Diagnosis present

## 2016-12-17 DIAGNOSIS — I951 Orthostatic hypotension: Secondary | ICD-10-CM | POA: Diagnosis present

## 2016-12-17 DIAGNOSIS — M899 Disorder of bone, unspecified: Secondary | ICD-10-CM | POA: Diagnosis present

## 2016-12-17 DIAGNOSIS — D649 Anemia, unspecified: Secondary | ICD-10-CM | POA: Diagnosis not present

## 2016-12-17 DIAGNOSIS — E11622 Type 2 diabetes mellitus with other skin ulcer: Secondary | ICD-10-CM | POA: Diagnosis present

## 2016-12-17 DIAGNOSIS — R404 Transient alteration of awareness: Secondary | ICD-10-CM | POA: Diagnosis not present

## 2016-12-17 DIAGNOSIS — E1122 Type 2 diabetes mellitus with diabetic chronic kidney disease: Secondary | ICD-10-CM | POA: Diagnosis present

## 2016-12-17 DIAGNOSIS — I87313 Chronic venous hypertension (idiopathic) with ulcer of bilateral lower extremity: Secondary | ICD-10-CM | POA: Diagnosis not present

## 2016-12-17 DIAGNOSIS — N179 Acute kidney failure, unspecified: Secondary | ICD-10-CM | POA: Diagnosis not present

## 2016-12-17 DIAGNOSIS — L02416 Cutaneous abscess of left lower limb: Secondary | ICD-10-CM | POA: Diagnosis present

## 2016-12-17 DIAGNOSIS — E876 Hypokalemia: Secondary | ICD-10-CM | POA: Diagnosis not present

## 2016-12-17 DIAGNOSIS — F332 Major depressive disorder, recurrent severe without psychotic features: Secondary | ICD-10-CM | POA: Diagnosis present

## 2016-12-17 DIAGNOSIS — E861 Hypovolemia: Secondary | ICD-10-CM | POA: Diagnosis present

## 2016-12-17 DIAGNOSIS — L02415 Cutaneous abscess of right lower limb: Secondary | ICD-10-CM | POA: Diagnosis present

## 2016-12-17 DIAGNOSIS — L97221 Non-pressure chronic ulcer of left calf limited to breakdown of skin: Secondary | ICD-10-CM | POA: Diagnosis present

## 2016-12-17 DIAGNOSIS — Z6841 Body Mass Index (BMI) 40.0 and over, adult: Secondary | ICD-10-CM

## 2016-12-17 DIAGNOSIS — R600 Localized edema: Secondary | ICD-10-CM | POA: Diagnosis present

## 2016-12-17 DIAGNOSIS — Z823 Family history of stroke: Secondary | ICD-10-CM

## 2016-12-17 DIAGNOSIS — Z7982 Long term (current) use of aspirin: Secondary | ICD-10-CM

## 2016-12-17 DIAGNOSIS — M6281 Muscle weakness (generalized): Secondary | ICD-10-CM

## 2016-12-17 DIAGNOSIS — I10 Essential (primary) hypertension: Secondary | ICD-10-CM | POA: Diagnosis not present

## 2016-12-17 DIAGNOSIS — Z8249 Family history of ischemic heart disease and other diseases of the circulatory system: Secondary | ICD-10-CM

## 2016-12-17 LAB — BASIC METABOLIC PANEL
Anion gap: 11 (ref 5–15)
BUN: 35 mg/dL — ABNORMAL HIGH (ref 6–20)
CO2: 21 mmol/L — ABNORMAL LOW (ref 22–32)
Calcium: 9.6 mg/dL (ref 8.9–10.3)
Chloride: 103 mmol/L (ref 101–111)
Creatinine, Ser: 2.62 mg/dL — ABNORMAL HIGH (ref 0.44–1.00)
GFR calc Af Amer: 23 mL/min — ABNORMAL LOW (ref >60)
GFR calc non Af Amer: 20 mL/min — ABNORMAL LOW (ref >60)
Glucose, Bld: 145 mg/dL — ABNORMAL HIGH (ref 65–99)
Potassium: 2.6 mmol/L — CL (ref 3.5–5.1)
Sodium: 135 mmol/L (ref 135–145)

## 2016-12-17 LAB — MAGNESIUM: Magnesium: 2.4 mg/dL (ref 1.7–2.4)

## 2016-12-17 LAB — CBC
HCT: 30.1 % — ABNORMAL LOW (ref 36.0–46.0)
Hemoglobin: 9.8 g/dL — ABNORMAL LOW (ref 12.0–15.0)
MCH: 28.7 pg (ref 26.0–34.0)
MCHC: 32.6 g/dL (ref 30.0–36.0)
MCV: 88 fL (ref 78.0–100.0)
Platelets: 366 10*3/uL (ref 150–400)
RBC: 3.42 MIL/uL — ABNORMAL LOW (ref 3.87–5.11)
RDW: 16.9 % — ABNORMAL HIGH (ref 11.5–15.5)
WBC: 9.7 10*3/uL (ref 4.0–10.5)

## 2016-12-17 LAB — TROPONIN I: Troponin I: <0.03 ng/mL (ref <0.03)

## 2016-12-17 LAB — LACTIC ACID, PLASMA: Lactic Acid, Venous: 1.1 mmol/L (ref 0.5–1.9)

## 2016-12-17 LAB — TSH: TSH: 5.312 u[IU]/mL — ABNORMAL HIGH (ref 0.350–4.500)

## 2016-12-17 MED ORDER — VANCOMYCIN HCL IN DEXTROSE 1-5 GM/200ML-% IV SOLN
1000.0000 mg | Freq: Once | INTRAVENOUS | Status: AC
  Administered 2016-12-17: 1000 mg via INTRAVENOUS
  Filled 2016-12-17: qty 200

## 2016-12-17 MED ORDER — POTASSIUM CHLORIDE CRYS ER 20 MEQ PO TBCR
60.0000 meq | EXTENDED_RELEASE_TABLET | Freq: Two times a day (BID) | ORAL | Status: DC
  Administered 2016-12-17: 60 meq via ORAL
  Filled 2016-12-17: qty 3

## 2016-12-17 MED ORDER — PIPERACILLIN-TAZOBACTAM 3.375 G IVPB 30 MIN
3.3750 g | Freq: Once | INTRAVENOUS | Status: AC
  Administered 2016-12-17: 3.375 g via INTRAVENOUS
  Filled 2016-12-17: qty 50

## 2016-12-17 MED ORDER — SODIUM CHLORIDE 0.9 % IV BOLUS (SEPSIS)
1000.0000 mL | Freq: Once | INTRAVENOUS | Status: AC
  Administered 2016-12-17: 1000 mL via INTRAVENOUS

## 2016-12-17 NOTE — Assessment & Plan Note (Signed)
Replace. Check Magnesium.

## 2016-12-17 NOTE — ED Triage Notes (Signed)
cbg 147 per [facility], pt believes she got too hot while washing dishes at home.  Reported that pt had passed out for a minute but pt denies passing out. States it was too hot at home

## 2016-12-17 NOTE — ED Notes (Signed)
Pt ortho vitals standing not completed due to pt feeling dizzy trying to stand

## 2016-12-17 NOTE — Assessment & Plan Note (Signed)
Massive. Reports she has lost weight over the last month, not gained. Plan: Daily weights. Echo.

## 2016-12-17 NOTE — ED Notes (Signed)
CRITICAL VALUE ALERT  Critical value received:  K 2.6  Date of notification:  12/17/16  Time of notification:  2135  Critical value read back:Yes.    Nurse who received alert:  B. Olena Heckle, RN  MD notified (1st page):  [place]  Time of first page:  2135

## 2016-12-17 NOTE — ED Notes (Signed)
Wet to dry dressings applied to both lower legs per orders of Dr. Venora Maples.

## 2016-12-17 NOTE — Assessment & Plan Note (Signed)
Likely multifactorial. Plan: Neuro checks. Telemetry. Echocardiogram.

## 2016-12-17 NOTE — Assessment & Plan Note (Signed)
Cr on admission 2.6. No history of kidney disease. Plan: Recheck in AM. Check U Na and U Cr. Renal ultrasound. Avoid nephrotoxins.

## 2016-12-17 NOTE — Assessment & Plan Note (Signed)
Lungs clear on admission. Unlikely to be cause of syncope. O2 sats 98%. Plan: Check CXR. Monitor O2 sats.

## 2016-12-17 NOTE — ED Provider Notes (Signed)
[facility] DEPT Provider Note   CSN: [id number] Arrival date & time: 12/17/16  2006  By signing my name below, I, Hilbert Odor, attest that this documentation has been prepared under the direction and in the presence of Jola Schmidt, MD. Electronically Signed: Hilbert Odor, Scribe. 12/17/16. 8:28 PM. History   Chief Complaint Chief Complaint  Patient presents with  . Near Syncope    The history is provided by the patient and the spouse. No language interpreter was used.   HPI Comments: Kari [name] is a 55 y.o. female brought in by ambulance, who presents to the Emergency Department complaining of a near syncope episode that occurred PTA. Patient reports feeling well for the past few days. Patient states that she was standing at the sink tonight when she felt like she was going to pass out. She reports standing at the sink for a long time. She denies syncope. Patient states that she has been feeling a little warm lately but denies a fever. She denies any new medications or changes in her medications. She also reports that she has been feeling a little sick lately. The patient has an appointment with her PCP, Dr. Legrand Rams in January of 2018.  Per family: They report that the patient was found on the floor. They believe the patient completely passed out.  She has chronic bilateral lower extremity wounds that are being managed by home health wound care however the family reports the patient has been refusing their care and that the wounds have a foul smell to them.  Past Medical History:  Diagnosis Date  . Asthma   . CHF (congestive heart failure) ([place]) 03/12/2014  . Depression   . Headache(784.0)   . Hyperlipidemia   . Hypertension   . Lung nodules   . Renal disorder   . Sarcoidosis ([place])   . Ulcer ([place])    left posterior calf    Patient Active Problem List   Diagnosis Date Noted  . Acute diastolic CHF (congestive heart failure) ([place]) 03/14/2014  . CHF (congestive  heart failure) ([place]) 03/12/2014  . Sarcoidosis ([place]) 03/12/2014  . Severe sepsis ([place]) 10/17/2013  . ARF (acute renal failure) ([place]) 10/08/2013  . Cellulitis and abscess 10/08/2013  . Obesity 10/08/2013  . Volume depletion 10/08/2013  . Unspecified venous (peripheral) insufficiency 10/28/2012  . Mediastinal lymphadenopathy 10/16/2012  . Pulmonary nodules 10/16/2012  . 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 Chest pain 09/02/2012  . Asthma   . Hypertension   . Depression   . Venous stasis ulcers ([place]) 07/29/2012  . 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 Venous ulcer of leg ([place]) 07/08/2012    Past Surgical History:  Procedure Laterality Date  . cataract surgery Left 07-2013  . NO PAST SURGERIES      OB History    No data available       Home Medications    Prior to Admission medications   Medication Sig Start Date End Date Taking? Authorizing Provider  albuterol (PROVENTIL HFA;VENTOLIN HFA) 108 (90 BASE) MCG/ACT inhaler Inhale 2 puffs into the lungs every 6 (six) hours as needed for shortness of breath.    Historical Provider, MD  amLODipine (NORVASC) 5 MG tablet Take 5 mg by mouth every morning.     Historical Provider, MD  aspirin EC 81 MG tablet Take 81 mg by mouth daily.    Historical Provider, MD  cephALEXin (KEFLEX) 500 MG capsule Take 1  capsule (500 mg total) by mouth 2 (two) times daily. Patient not taking: Reported on 01/11/2016 10/21/15   Quintella Reichert, MD  doxycycline (VIBRAMYCIN) 100 MG capsule Take 1 capsule (100 mg total) by mouth 2 (two) times daily. One po bid x 7 days 01/11/16   Milton Ferguson, MD  furosemide (LASIX) 40 MG tablet Take 1 tablet (40 mg total) by mouth daily. 10/21/15   Quintella Reichert, MD  ibuprofen (ADVIL,MOTRIN) 800 MG tablet Take 800 mg by mouth every 6 (six) hours as needed for moderate pain.    Historical Provider, MD  potassium chloride SA (K-DUR,KLOR-CON) 20  MEQ tablet Take 1 tablet (20 mEq total) by mouth daily. 01/11/16   Milton Ferguson, MD  traMADol (ULTRAM) 50 MG tablet Take 1 tablet (50 mg total) by mouth every 6 (six) hours as needed. Patient not taking: Reported on 01/11/2016 10/21/15   Quintella Reichert, MD  Wound Dressings Haze Rushing) GEL Apply twice daily with dressing changes 10/21/15   Quintella Reichert, MD    Family History Family History  Problem Relation Age of Onset  . Heart failure Mother   . Heart disease Father     Social History Social History  Substance Use Topics  . Smoking status: Never Smoker  . Smokeless tobacco: Never Used  . Alcohol use No     Allergies   Patient has no known allergies.   Review of Systems Review of Systems A complete 10 system review of systems was obtained and all systems are negative except as noted in the HPI and PMH.   Physical Exam Updated Vital Signs Ht 5\' 7"  (1.702 m)   Wt (!) 320 lb (145.2 kg)   LMP 11/21/2011   BMI 50.12 kg/m   Physical Exam  Constitutional: She is oriented to person, place, and time. She appears well-developed and well-nourished. No distress.  HENT:  Head: Normocephalic and atraumatic.  Eyes: EOM are normal.  Neck: Normal range of motion.  Cardiovascular: Normal rate and regular rhythm.   Occasional ectopy noted  Pulmonary/Chest: Effort normal and breath sounds normal.  Abdominal: Soft. She exhibits no distension. There is no tenderness.  Musculoskeletal: Normal range of motion.  Bilateral chronic venous stasis ulcerations with foul smell and mucopurulent discharge without focal abscess.  Neurological: She is alert and oriented to person, place, and time.  Skin: Skin is warm and dry.  Psychiatric: She has a normal mood and affect. Judgment normal.  Nursing note and vitals reviewed.    ED Treatments / Results  DIAGNOSTIC STUDIES: Oxygen Saturation is 100% on RA, normal by my interpretation.    COORDINATION OF CARE: 8:28 PM Discussed treatment plan  with pt at bedside and pt agreed to plan.  Labs (all labs ordered are listed, but only abnormal results are displayed) Labs Reviewed - No data to display  EKG  EKG Interpretation  Date/Time:  Monday December 17 2016 20:14:09 EST Ventricular Rate:  87 PR Interval:    QRS Duration: 111 QT Interval:  362 QTC Calculation: 436 R Axis:   15 Text Interpretation:  Sinus rhythm ectopy noted Low voltage, precordial leads Nonspecific repol abnormality, lateral leads No significant change was found Confirmed by Davis Ambrosini  MD, Lennette Bihari ([id number]) on 12/17/2016 8:17:05 PM       Radiology No results found.  Procedures Procedures (including critical care time)  Medications Ordered in ED Medications - No data to display   Initial Impression / Assessment and Plan / ED Course  I have reviewed the triage vital  signs and the nursing notes.  Pertinent labs & imaging results that were available during my care of the patient were reviewed by me and considered in my medical decision making (see chart for details).  Clinical Course     Patient with acute kidney injury and hypokalemia.  Potassium replacement.  IV fluids now.  She does have foul-smelling discharge coming from both of her bilateral chronic wounds for which she'll be started on IV antibiotics.  Hospitalist admission for hydration and management of her acute renal failure and wound management of the bilateral and worsening lower extremity venous stasis ulcers  Final Clinical Impressions(s) / ED Diagnoses   Final diagnoses:  AKI (acute kidney injury) ([place])  Cellulitis of left lower extremity without foot  Cellulitis of right lower extremity without foot  Near syncope    New Prescriptions New Prescriptions   No medications on file   I personally performed the services described in this documentation, which was scribed in my presence. The recorded information has been reviewed and is accurate.       Jola Schmidt, MD 12/17/16 2149

## 2016-12-17 NOTE — Assessment & Plan Note (Signed)
Massive, foul-smelling venous statis ulcers that appears infected. Right leg and left leg. Plan: Blood cultures. Wound care consult. May need surgical evaluation. IV Zosyn and IV vanc.

## 2016-12-17 NOTE — Assessment & Plan Note (Signed)
Check anemia labs.

## 2016-12-17 NOTE — H&P (Addendum)
History and Physical   Patient: Kari [name] [id number] 1961/03/27  Date of Admission: 12/17/2016  PRIMARY CARE PROVIDER (PCP): Dr. Legrand Rams Now primary care doctor is Dr. Legrand Rams   Patient coming from: home  Chief Complaint: loss of consiousness  HPI:  The patient is a 55 yo woman with DM type 2, venous stasis ulcers on legs, who started to feel dizzy today around 7 pm and then was witnessed by family to lose consciousness. She was in the kitchen doing dishes and then she lost consciousness and sat back onto her rolling walker. She remembers feeling dizzy and hot right before it happened. Lasted approximately 5 minutes. Additional history provided by the patient's sister and brother. Onset: this evening at 7 pm. Duration: intermittent. Timing: lasted 5 min. Severity: severe in that she had complete loss of consciousness. Context: Was performing usual activities at home washing dishes. Location: generalized. Character/Quality: complete loss of consciousness; did not respond to family members Alleviated by: Nothing. Exacerbated by: Nothing. Associated Symptoms: No headache, slurred speech, acute focal weakness or numbness, facial droop. No loss of bowel or bladder control. Brother states he saw the patient clench her left fist and shake both arms briefly. Treatments: none at home except usual medications.  She also has lower extremity ulcers that have been worsening. Onset: started about 3 years ago. Duration: Chronically present but worsening over the last 3 months. Reports the ulcers were almost healed about a year ago. Timing: constant. Severity: severe and bilateral. Worsening. Context: Gradually worsening over 3 months. Location: bilateral lower extremities. Radiation: none. Character/Quality: open wounds that are foul smelling.  Alleviated by: Nothing. Exacerbated by: Nothing. Associated Symptoms: No headache, slurred speech, acute focal weakness or numbness, facial droop. No  loss of bowel or bladder control. Has lost 15 lbs intentionally over the last month. Treatments: none at home except usual medications. Has dressing changes twice per week.  ED Course: The patient was evaluated and found to have elevated creatinine and foul smelling massive open wounds/ulcers on both lower legs. She was started on IV vancomycin and Zosyn.   Past Medical History:  Diagnosis Date  . Asthma   . CHF (congestive heart failure) ([place]) 03/12/2014  . Depression   . Headache(784.0)   . Hyperlipidemia   . Hypertension   . Lung nodules   . Renal disorder   . Sarcoidosis ([place])   . Ulcer ([place])    left posterior calf    Past Surgical History:  Procedure Laterality Date  . cataract surgery Left 07-2013  . NO PAST SURGERIES      No Known Allergies  No current facility-administered medications on file prior to encounter.    Current Outpatient Prescriptions on File Prior to Encounter  Medication Sig Dispense Refill  . albuterol (PROVENTIL HFA;VENTOLIN HFA) 108 (90 BASE) MCG/ACT inhaler Inhale 2 puffs into the lungs every 6 (six) hours as needed for shortness of breath.    Marland Kitchen amLODipine (NORVASC) 5 MG tablet Take 5 mg by mouth every morning.     Marland Kitchen aspirin EC 81 MG tablet Take 81 mg by mouth daily.    . furosemide (LASIX) 40 MG tablet Take 1 tablet (40 mg total) by mouth daily. 12 tablet 0  . ibuprofen (ADVIL,MOTRIN) 800 MG tablet Take 800 mg by mouth every 6 (six) hours as needed for moderate pain.    . potassium chloride SA (K-DUR,KLOR-CON) 20 MEQ tablet Take 1 tablet (20 mEq total) by mouth daily. 7 tablet 0  .  Wound Dressings (SILVASORB) GEL Apply twice daily with dressing changes (Patient taking differently: Apply 1 application topically 2 (two) times a week. Apply twice daily with dressing changes) 1 Tube 3     Social History   Social History  . Marital status: Single    Spouse name: N/A  . Number of children: 1  . Years of education: N/A   Occupational History  .  Not on file.   Social History Main Topics  . Smoking status: Never Smoker  . Smokeless tobacco: Never Used  . Alcohol use No  . Drug use: No  . Sexual activity: Not Currently   Other Topics Concern  . Not on file   Social History Narrative   Lives alone.    Family History  Problem Relation Age of Onset  . Heart failure Mother   . Diabetes Mellitus II Mother   . Hypertension Mother   . Cancer Mother     unknown type  . Heart disease Father   . Stroke Father   . Diabetes Mellitus II Father      Review of Systems:   ROS: GENERAL: No Fever, chills, or diaphoresis. Positive for fatigue/malaise.  HEENT: No nasal discharge or bleeding. No throat pain or swelling. No eye pain or eye redness.  RESPIRATORY: No cough, wheezing, or shortness of breath.  CARDIOVASCULAR: No chest pain or palpitations.  GI: No abdominal pain, nausea, vomiting, diarrhea, constipation, or bloody stool.  NEUROLOGICAL: No headache or focal weakness. No numbness. INTEGUMENT: Large open wounds on both lower legs. No itching. LYMPHATIC SYSTEM: no lymph node swelling or pain. Has swelling in both legs. MUSCULOSKELETAL: no joint pain or joint swelling.  GENITOURINARY: No dysuria or hematuria.  ENDOCRINE: No polyuria or polydipsia.  HEME: No chronic anemia, bleeding, or easy bruising.   Physical Exam:  Vitals:   12/17/16 2008 12/17/16 2031 12/17/16 2040 12/17/16 2044  BP:    127/76  Pulse:  96 92 95  Resp:  25 16 20   Temp:    97.9 F (36.6 C)  TempSrc:    Oral  SpO2:  100% 100% 100%  Weight: (!) 145.2 kg (320 lb)     Height: 5\' 7"  (1.702 m)       GENERAL: Ill-appearing, well nourished, well-developed.  HEENT: Normocephalic, atraumatic; pupils equal and round. EOMI. Nares patent, without discharge or bleeding. No oropharyngeal lesions or erythema. Mucous membranes are dry.  NECK: is supple, no masses, trachea midline.  RESPIRATORY: Clear to auscultation bilaterally. Chest wall movements are  symmetric. No use of accessory muscles to breathe.  No wheezing, rales, rhonchi. CARDIOVASCULAR: Normal S1, S2. No rubs, or gallops. PMI non-displaced. Carotids: no carotid bruits. No bradycardia or tachycardia. DP pulses 1+ bilaterally. Murmur 3/6 systolic. GI: soft, nontender, protuberant, non-distended, normal active bowel sounds. No hepatosplenomegaly.  INTEGUMENT: Right lower leg: massive open wound approximately 24 cm x 24 cm with irregular borders, foul smelling, yellowish exudate, deep, warmth around edges. Left lower leg: very large open wound with component of hypertrophy of skin vs growth/tumor, 14-16 cm x 10-12 cm, with cauliflower/ verroucous appearance of the hypertrophied area. Also with exudate and foul odor. MUSCULOSKELETAL: Moving all extremities. No cyanosis. No clubbing. Edema: 4+ lower extremity edema bilaterally.  NEUROLOGICAL: Cranial nerves 2-12 grossly intact. Reflexes: 2+ bilaterally. Babinski: toes downgoing bilaterally.  Motor 4/5 throughout. Sensory grossly intact to light touch. Intact rapid alternating movements bilaterally. No pronator drift.  PSYCHIATRIC: Fully oriented. Normal and appropriate affect.  LYMPHATIC: No cervical  lymphadenopathy. No supraclavicular lymphadenopathy.   Labs on Admission: I have personally reviewed following labs and imaging studies.  Results for orders placed or performed during the hospital encounter of 12/17/16 (from the past 24 hour(s))  CBC   Collection Time: 12/17/16  8:55 PM  Result Value Ref Range   WBC 9.7 4.0 - 10.5 K/uL   RBC 3.42 (L) 3.87 - 5.11 MIL/uL   Hemoglobin 9.8 (L) 12.0 - 15.0 g/dL   HCT 30.1 (L) 36.0 - 46.0 %   MCV 88.0 78.0 - 100.0 fL   MCH 28.7 26.0 - 34.0 pg   MCHC 32.6 30.0 - 36.0 g/dL   RDW 16.9 (H) 11.5 - 15.5 %   Platelets 366 150 - 400 K/uL  Basic metabolic panel   Collection Time: 12/17/16  8:55 PM  Result Value Ref Range   Sodium 135 135 - 145 mmol/L   Potassium 2.6 (LL) 3.5 - 5.1 mmol/L    Chloride 103 101 - 111 mmol/L   CO2 21 (L) 22 - 32 mmol/L   Glucose, Bld 145 (H) 65 - 99 mg/dL   BUN 35 (H) 6 - 20 mg/dL   Creatinine, Ser 2.62 (H) 0.44 - 1.00 mg/dL   Calcium 9.6 8.9 - 10.3 mg/dL   GFR calc non Af Amer 20 (L) >60 mL/min   GFR calc Af Amer 23 (L) >60 mL/min   Anion gap 11 5 - 15  Troponin I   Collection Time: 12/17/16  8:55 PM  Result Value Ref Range   Troponin I <0.03 <0.03 ng/mL        Radiological Exams on Admission:  No orders to display  None. CXR pending.   EKG: Independently reviewed.  87 bpm. Sinus rhythm. Nonspecific ST abnormalities.  Assessment/Plan  AKI (acute kidney injury) ([place]) Cr on admission 2.6. No history of kidney disease. Plan: Recheck in AM. Check U Na and U Cr. Renal ultrasound. Avoid nephrotoxins.  Cellulitis and abscess of right lower extremity Massive, foul-smelling venous statis ulcers that appears infected. Right leg and left leg. Plan: Blood cultures. Wound care consult. May need surgical evaluation. IV Zosyn and IV vanc.  Syncope and collapse Likely multifactorial. Plan: Neuro checks. Telemetry. Echocardiogram.   Peripheral edema Massive. Reports she has lost weight over the last month, not gained. Plan: Daily weights. Echo.   Sarcoidosis ([place]) Lungs clear on admission. Unlikely to be cause of syncope. O2 sats 98%. Plan: Check CXR. Monitor O2 sats.  Hypokalemia Replace. Check Magnesium.  Anemia NOS. Etiology unclear. Ordered anemia labs. Hemoccult stool.  DM 2, hyperglycemia, not on insulin. Plan: Hold oral diabetes medications.  Check fingerstick blood sugars q ac and hs.  Sliding scale insulin.          DVT prophylaxis: Lovenox.  Code Status: Full Family Communication: discussed with 2 sisters and brother, all were at bedside.  Disposition Plan: home, 12/21/16 Consults called: Wound care for am Admission status: Inpatient (inpatient / obs / tele / medical floor / SDU)     Patient requires admission due to risks, which include: Death, sepsis, loss of limb, worsening pain and suffering.   Tacey Ruiz MD [facility] Pager [phone]

## 2016-12-18 ENCOUNTER — Inpatient Hospital Stay (HOSPITAL_COMMUNITY): Payer: Medicare Other

## 2016-12-18 ENCOUNTER — Other Ambulatory Visit (HOSPITAL_COMMUNITY): Payer: Medicare Other

## 2016-12-18 DIAGNOSIS — I10 Essential (primary) hypertension: Secondary | ICD-10-CM | POA: Diagnosis not present

## 2016-12-18 DIAGNOSIS — L03115 Cellulitis of right lower limb: Secondary | ICD-10-CM | POA: Diagnosis not present

## 2016-12-18 DIAGNOSIS — N179 Acute kidney failure, unspecified: Principal | ICD-10-CM

## 2016-12-18 DIAGNOSIS — R55 Syncope and collapse: Secondary | ICD-10-CM | POA: Diagnosis not present

## 2016-12-18 DIAGNOSIS — D869 Sarcoidosis, unspecified: Secondary | ICD-10-CM | POA: Diagnosis not present

## 2016-12-18 DIAGNOSIS — D649 Anemia, unspecified: Secondary | ICD-10-CM | POA: Diagnosis not present

## 2016-12-18 DIAGNOSIS — L02415 Cutaneous abscess of right lower limb: Secondary | ICD-10-CM | POA: Diagnosis not present

## 2016-12-18 DIAGNOSIS — E876 Hypokalemia: Secondary | ICD-10-CM | POA: Diagnosis not present

## 2016-12-18 LAB — IRON AND TIBC
Iron: 19 ug/dL — ABNORMAL LOW (ref 28–170)
Iron: 25 ug/dL — ABNORMAL LOW (ref 28–170)
Saturation Ratios: 8 % — ABNORMAL LOW (ref 10.4–31.8)
Saturation Ratios: 11 % (ref 10.4–31.8)
TIBC: 242 ug/dL — ABNORMAL LOW (ref 250–450)
TIBC: 220 ug/dL — ABNORMAL LOW (ref 250–450)
UIBC: 223 ug/dL
UIBC: 195 ug/dL

## 2016-12-18 LAB — URINALYSIS, COMPLETE (UACMP) WITH MICROSCOPIC
Bilirubin Urine: NEGATIVE
Glucose, UA: NEGATIVE mg/dL
Hgb urine dipstick: NEGATIVE
Ketones, ur: NEGATIVE mg/dL
Nitrite: NEGATIVE
Protein, ur: 30 mg/dL — AB
Specific Gravity, Urine: 1.013 (ref 1.005–1.030)
pH: 5 (ref 5.0–8.0)

## 2016-12-18 LAB — ECHOCARDIOGRAM COMPLETE
AO mean calculated velocity dopler: 145 cm/s
AV Area VTI index: 0.63 cm2/m2
AV Area VTI: 1.5 cm2
AV Area mean vel: 1.68 cm2
AV Mean grad: 11 mmHg
AV Peak grad: 22 mmHg
AV VEL mean LVOT/AV: 0.74
AV area mean vel ind: 0.66 cm2/m2
AV peak Index: 0.59
AV pk vel: 234 cm/s
AV vel: 1.62
Ao pk vel: 0.66 m/s
E decel time: 303 msec
E/e' ratio: 9.28
FS: 31 % (ref 28–44)
Height: 67 in
IVS/LV PW RATIO, ED: 0.98
LA ID, A-P, ES: 50 mm
LA diam end sys: 50 mm
LA diam index: 1.96 cm/m2
LA vol A4C: 76.7 ml
LA vol index: 26 mL/m2
LA vol: 66.5 mL
LV E/e' medial: 9.28
LV E/e'average: 9.28
LV PW d: 12.1 mm — AB (ref 0.6–1.1)
LV dias vol index: 41 mL/m2
LV dias vol: 105 mL (ref 46–106)
LV e' LATERAL: 11.1 cm/s
LV sys vol index: 16 mL/m2
LV sys vol: 40 mL (ref 14–42)
LVOT SV: 76 mL
LVOT VTI: 33.5 cm
LVOT area: 2.27 cm2
LVOT diameter: 17 mm
LVOT peak VTI: 0.72 cm
LVOT peak grad rest: 10 mmHg
LVOT peak vel: 155 cm/s
Lateral S' vel: 17.4 cm/s
MV Dec: 303
MV Peak grad: 4 mmHg
MV pk A vel: 115 m/s
MV pk E vel: 103 m/s
RV sys press: 40 mmHg
Reg peak vel: 282 cm/s
Simpson's disk: 62
Stroke v: 65 ml
TAPSE: 28.6 mm
TDI e' lateral: 11.1
TDI e' medial: 7.51
TR max vel: 282 cm/s
VTI: 46.8 cm
Valve area index: 0.63
Valve area: 1.62 cm2
Weight: 4608 oz

## 2016-12-18 LAB — RETICULOCYTES
RBC.: 3.17 MIL/uL — ABNORMAL LOW (ref 3.87–5.11)
RBC.: 3 MIL/uL — ABNORMAL LOW (ref 3.87–5.11)
Retic Count, Absolute: 66.6 10*3/uL (ref 19.0–186.0)
Retic Count, Absolute: 66 10*3/uL (ref 19.0–186.0)
Retic Ct Pct: 2.1 % (ref 0.4–3.1)
Retic Ct Pct: 2.2 % (ref 0.4–3.1)

## 2016-12-18 LAB — CBC
HCT: 26.1 % — ABNORMAL LOW (ref 36.0–46.0)
Hemoglobin: 8.6 g/dL — ABNORMAL LOW (ref 12.0–15.0)
MCH: 29.2 pg (ref 26.0–34.0)
MCHC: 33 g/dL (ref 30.0–36.0)
MCV: 88.5 fL (ref 78.0–100.0)
Platelets: 332 10*3/uL (ref 150–400)
RBC: 2.95 MIL/uL — ABNORMAL LOW (ref 3.87–5.11)
RDW: 16.9 % — ABNORMAL HIGH (ref 11.5–15.5)
WBC: 8.8 10*3/uL (ref 4.0–10.5)

## 2016-12-18 LAB — GLUCOSE, CAPILLARY
Glucose-Capillary: 107 mg/dL — ABNORMAL HIGH (ref 65–99)
Glucose-Capillary: 111 mg/dL — ABNORMAL HIGH (ref 65–99)
Glucose-Capillary: 113 mg/dL — ABNORMAL HIGH (ref 65–99)
Glucose-Capillary: 121 mg/dL — ABNORMAL HIGH (ref 65–99)
Glucose-Capillary: 124 mg/dL — ABNORMAL HIGH (ref 65–99)

## 2016-12-18 LAB — SODIUM, URINE, RANDOM: Sodium, Ur: 7 mmol/L

## 2016-12-18 LAB — TROPONIN I
Troponin I: <0.03 ng/mL (ref <0.03)
Troponin I: <0.03 ng/mL (ref <0.03)

## 2016-12-18 LAB — VITAMIN B12
Vitamin B-12: 149 pg/mL — ABNORMAL LOW (ref 180–914)
Vitamin B-12: 143 pg/mL — ABNORMAL LOW (ref 180–914)

## 2016-12-18 LAB — BRAIN NATRIURETIC PEPTIDE: B Natriuretic Peptide: 56 pg/mL (ref 0.0–100.0)

## 2016-12-18 LAB — BASIC METABOLIC PANEL
Anion gap: 9 (ref 5–15)
BUN: 32 mg/dL — ABNORMAL HIGH (ref 6–20)
CO2: 20 mmol/L — ABNORMAL LOW (ref 22–32)
Calcium: 8.9 mg/dL (ref 8.9–10.3)
Chloride: 108 mmol/L (ref 101–111)
Creatinine, Ser: 2.25 mg/dL — ABNORMAL HIGH (ref 0.44–1.00)
GFR calc Af Amer: 27 mL/min — ABNORMAL LOW (ref >60)
GFR calc non Af Amer: 24 mL/min — ABNORMAL LOW (ref >60)
Glucose, Bld: 118 mg/dL — ABNORMAL HIGH (ref 65–99)
Potassium: 2.7 mmol/L — CL (ref 3.5–5.1)
Sodium: 137 mmol/L (ref 135–145)

## 2016-12-18 LAB — COMPREHENSIVE METABOLIC PANEL
ALT: 8 U/L — ABNORMAL LOW (ref 14–54)
AST: 10 U/L — ABNORMAL LOW (ref 15–41)
Albumin: 2.2 g/dL — ABNORMAL LOW (ref 3.5–5.0)
Alkaline Phosphatase: 36 U/L — ABNORMAL LOW (ref 38–126)
Anion gap: 7 (ref 5–15)
BUN: 31 mg/dL — ABNORMAL HIGH (ref 6–20)
CO2: 21 mmol/L — ABNORMAL LOW (ref 22–32)
Calcium: 8.8 mg/dL — ABNORMAL LOW (ref 8.9–10.3)
Chloride: 108 mmol/L (ref 101–111)
Creatinine, Ser: 2.12 mg/dL — ABNORMAL HIGH (ref 0.44–1.00)
GFR calc Af Amer: 29 mL/min — ABNORMAL LOW (ref >60)
GFR calc non Af Amer: 25 mL/min — ABNORMAL LOW (ref >60)
Glucose, Bld: 117 mg/dL — ABNORMAL HIGH (ref 65–99)
Potassium: 2.8 mmol/L — ABNORMAL LOW (ref 3.5–5.1)
Sodium: 136 mmol/L (ref 135–145)
Total Bilirubin: 0.3 mg/dL (ref 0.3–1.2)
Total Protein: 7.1 g/dL (ref 6.5–8.1)

## 2016-12-18 LAB — FERRITIN
Ferritin: 141 ng/mL (ref 11–307)
Ferritin: 126 ng/mL (ref 11–307)

## 2016-12-18 LAB — CREATININE, URINE, RANDOM: Creatinine, Urine: 116.02 mg/dL

## 2016-12-18 LAB — FOLATE: Folate: 9.9 ng/mL (ref >5.9)

## 2016-12-18 LAB — MAGNESIUM: Magnesium: 2.3 mg/dL (ref 1.7–2.4)

## 2016-12-18 MED ORDER — ASPIRIN EC 81 MG PO TBEC
81.0000 mg | DELAYED_RELEASE_TABLET | Freq: Every day | ORAL | Status: DC
  Administered 2016-12-18 – 2016-12-19 (×2): 81 mg via ORAL
  Filled 2016-12-18 (×2): qty 1

## 2016-12-18 MED ORDER — ACETAMINOPHEN 650 MG RE SUPP: 650.0000 mg | Freq: Four times a day (QID) | RECTAL | Status: DC | PRN

## 2016-12-18 MED ORDER — ENSURE ENLIVE PO LIQD
237.0000 mL | Freq: Two times a day (BID) | ORAL | Status: DC
  Administered 2016-12-18 – 2016-12-19 (×3): 237 mL via ORAL

## 2016-12-18 MED ORDER — OXYCODONE HCL 5 MG PO TABS
5.0000 mg | ORAL_TABLET | ORAL | Status: DC | PRN
  Administered 2016-12-18 – 2016-12-19 (×6): 5 mg via ORAL
  Filled 2016-12-18 (×6): qty 1

## 2016-12-18 MED ORDER — LIDOCAINE HCL (PF) 1 % IJ SOLN
5.0000 mL | Freq: Once | INTRAMUSCULAR | Status: AC
  Administered 2016-12-18: 5 mL via INTRADERMAL
  Filled 2016-12-18: qty 5

## 2016-12-18 MED ORDER — MIDODRINE HCL 5 MG PO TABS
5.0000 mg | ORAL_TABLET | Freq: Three times a day (TID) | ORAL | Status: DC
  Administered 2016-12-18 – 2016-12-19 (×5): 5 mg via ORAL
  Filled 2016-12-18 (×5): qty 1

## 2016-12-18 MED ORDER — PRO-STAT SUGAR FREE PO LIQD
30.0000 mL | Freq: Two times a day (BID) | ORAL | Status: DC
  Administered 2016-12-18 – 2016-12-19 (×3): 30 mL via ORAL
  Filled 2016-12-18 (×3): qty 30

## 2016-12-18 MED ORDER — TRAZODONE HCL 50 MG PO TABS: 25.0000 mg | ORAL_TABLET | Freq: Every evening | ORAL | Status: DC | PRN

## 2016-12-18 MED ORDER — ENOXAPARIN SODIUM 40 MG/0.4ML ~~LOC~~ SOLN: 40.0000 mg | SUBCUTANEOUS | Status: DC

## 2016-12-18 MED ORDER — ENOXAPARIN SODIUM 80 MG/0.8ML ~~LOC~~ SOLN
65.0000 mg | SUBCUTANEOUS | Status: DC
  Administered 2016-12-18 – 2016-12-19 (×2): 65 mg via SUBCUTANEOUS
  Filled 2016-12-18 (×2): qty 0.8

## 2016-12-18 MED ORDER — TRAMADOL HCL 50 MG PO TABS: 50.0000 mg | ORAL_TABLET | Freq: Four times a day (QID) | ORAL | Status: DC | PRN

## 2016-12-18 MED ORDER — SENNOSIDES-DOCUSATE SODIUM 8.6-50 MG PO TABS: 1.0000 | ORAL_TABLET | Freq: Every evening | ORAL | Status: DC | PRN

## 2016-12-18 MED ORDER — POTASSIUM CHLORIDE 10 MEQ/100ML IV SOLN
10.0000 meq | INTRAVENOUS | Status: AC
Start: 2016-12-18 — End: 2016-12-18
  Administered 2016-12-18 (×4): 10 meq via INTRAVENOUS
  Filled 2016-12-18 (×4): qty 100

## 2016-12-18 MED ORDER — VANCOMYCIN HCL 500 MG IV SOLR
500.0000 mg | Freq: Once | INTRAVENOUS | Status: AC
  Administered 2016-12-18: 500 mg via INTRAVENOUS
  Filled 2016-12-18: qty 500

## 2016-12-18 MED ORDER — SODIUM CHLORIDE 0.9 % IV SOLN
INTRAVENOUS | Status: DC
  Administered 2016-12-18 – 2016-12-19 (×2): via INTRAVENOUS

## 2016-12-18 MED ORDER — VANCOMYCIN HCL 500 MG IV SOLR
INTRAVENOUS | Status: AC
  Filled 2016-12-18: qty 500

## 2016-12-18 MED ORDER — FUROSEMIDE 40 MG PO TABS: 40.0000 mg | ORAL_TABLET | Freq: Every day | ORAL | Status: DC

## 2016-12-18 MED ORDER — ACETAMINOPHEN 325 MG PO TABS: 650.0000 mg | ORAL_TABLET | Freq: Four times a day (QID) | ORAL | Status: DC | PRN

## 2016-12-18 MED ORDER — SILVASORB EX GEL: 1.0000 "application " | Freq: Two times a day (BID) | CUTANEOUS | Status: DC

## 2016-12-18 MED ORDER — PIPERACILLIN-TAZOBACTAM 3.375 G IVPB
3.3750 g | Freq: Once | INTRAVENOUS | Status: AC
  Administered 2016-12-18: 3.375 g via INTRAVENOUS
  Filled 2016-12-18: qty 50

## 2016-12-18 MED ORDER — ALBUTEROL SULFATE (2.5 MG/3ML) 0.083% IN NEBU: 2.5000 mg | INHALATION_SOLUTION | RESPIRATORY_TRACT | Status: DC | PRN

## 2016-12-18 MED ORDER — ONDANSETRON HCL 4 MG/2ML IJ SOLN: 4.0000 mg | Freq: Four times a day (QID) | INTRAMUSCULAR | Status: DC | PRN

## 2016-12-18 MED ORDER — INSULIN ASPART 100 UNIT/ML ~~LOC~~ SOLN
0.0000 [IU] | Freq: Three times a day (TID) | SUBCUTANEOUS | Status: DC
  Administered 2016-12-18: 2 [IU] via SUBCUTANEOUS

## 2016-12-18 MED ORDER — PIPERACILLIN-TAZOBACTAM 3.375 G IVPB
3.3750 g | Freq: Three times a day (TID) | INTRAVENOUS | Status: DC
  Administered 2016-12-18 – 2016-12-19 (×4): 3.375 g via INTRAVENOUS
  Filled 2016-12-18 (×4): qty 50

## 2016-12-18 MED ORDER — VANCOMYCIN HCL 10 G IV SOLR
1500.0000 mg | INTRAVENOUS | Status: DC
  Administered 2016-12-18: 1500 mg via INTRAVENOUS
  Filled 2016-12-18 (×2): qty 1500

## 2016-12-18 MED ORDER — POTASSIUM CHLORIDE CRYS ER 20 MEQ PO TBCR
40.0000 meq | EXTENDED_RELEASE_TABLET | Freq: Three times a day (TID) | ORAL | Status: AC
  Administered 2016-12-18 (×3): 40 meq via ORAL
  Filled 2016-12-18 (×3): qty 2

## 2016-12-18 MED ORDER — SODIUM CHLORIDE 0.9% FLUSH
3.0000 mL | Freq: Two times a day (BID) | INTRAVENOUS | Status: DC
  Administered 2016-12-18 – 2016-12-19 (×3): 3 mL via INTRAVENOUS

## 2016-12-18 MED ORDER — ONDANSETRON HCL 4 MG PO TABS: 4.0000 mg | ORAL_TABLET | Freq: Four times a day (QID) | ORAL | Status: DC | PRN

## 2016-12-18 MED ORDER — LIDOCAINE HCL (PF) 1 % IJ SOLN
10.0000 mL | Freq: Once | INTRAMUSCULAR | Status: AC
  Administered 2016-12-18: 10 mL via INTRADERMAL
  Filled 2016-12-18: qty 10

## 2016-12-18 MED ORDER — INSULIN ASPART 100 UNIT/ML ~~LOC~~ SOLN: 0.0000 [IU] | Freq: Every day | SUBCUTANEOUS | Status: DC

## 2016-12-18 NOTE — Progress Notes (Addendum)
ANTIBIOTIC CONSULT NOTE-Preliminary  Pharmacy Consult for Vancomycin and Zosyn Indication: Cellulitis  No Known Allergies  Patient Measurements: Height: 5\' 7"  (170.2 cm) Weight: 288 lb (130.6 kg) IBW/kg (Calculated) : 61.6  Vital Signs: Temp: 98.5 F (36.9 C) (12/26 0009) Temp Source: Oral (12/26 0009) BP: 109/59 (12/26 0009) Pulse Rate: 92 (12/26 0009)  Labs:  Recent Labs  12/17/16 2055  WBC 9.7  HGB 9.8*  PLT 366  CREATININE 2.62*    Estimated Creatinine Clearance: 34.6 mL/min (by C-G formula based on SCr of 2.62 mg/dL (H)).  No results for input(s): VANCOTROUGH, VANCOPEAK, VANCORANDOM, GENTTROUGH, GENTPEAK, GENTRANDOM, TOBRATROUGH, TOBRAPEAK, TOBRARND, AMIKACINPEAK, AMIKACINTROU, AMIKACIN in the last 72 hours.   Microbiology: No results found for this or any previous visit (from the past 720 hour(s)).  Medical History: Past Medical History:  Diagnosis Date  . Asthma   . CHF (congestive heart failure) ([place]) 03/12/2014  . Depression   . Headache(784.0)   . Hyperlipidemia   . Hypertension   . Lung nodules   . Renal disorder   . Sarcoidosis ([place])   . Ulcer ([place])    left posterior calf    Medications:  Vancomycin 1 Gm IV given in the ED Zosyn 3.375 Gm IV given in the ED   Assessment: 55 yo diabetic female admitted for worsening bilateral lower extremity venous stasis ulcers. Pt to be given empiric antibiotics for cellulitis.  Goal of Therapy:  Vancomycin troughs 10-15 mcg/ml Eradicate infection  Plan:  Preliminary review of pertinent patient information completed.  Protocol will be initiated with one-time doses of Vancomycin 500 mg in addition to the 1 Gm IV dose given in the ED for a total loading dose of 1500 mg, and Zosyn 3.375 Gm IV @ 8 hours after the initial ED dose.[place] clinical pharmacist will complete review during morning rounds to assess patient and finalize treatment regimen.  Norberto Sorenson, [facility] 12/18/2016,1:47 AM   Addum:   Will continue vancomycin 1500 mg IV q24 hours. Cont zosyn 3.375 gm IV q8 hours F/u renal function, cultures and clinical course

## 2016-12-18 NOTE — Progress Notes (Signed)
Patient refusing orthostatic VS at this time. States that getting up hurts legs too much d/t wounds and swelling.

## 2016-12-18 NOTE — Progress Notes (Signed)
*  PRELIMINARY RESULTS* Echocardiogram 2D Echocardiogram has been performed.  Samuel Germany 12/18/2016, 3:45 PM

## 2016-12-18 NOTE — Consult Note (Signed)
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 Near Syncope   HPI: Patient is a 55 year old female with past medical hx of  with DM type 2 who came to ER with c/o syncope.  HPI dates back to 2 days ago when pt started to feel dizzy and then was witnessed by family to lose consciousness. Pt was in the kitchen doing dishes and then she lost consciousness and sat back onto her rolling walker.  Pt had  No headache/ slurred speech/ acute focal weakness or numbness. No facial droop.  No loss of bowel or bladder control.  Pt  also has lower extremity ulcers that have been worsening. Ulcer had started about 3 years ago, but worsening over the last 3 months.  Pt seen on 3rd floor, pt says " I am feeling better" Pt says " My legs are killing me, they just dbrided me"    Past Medical History:  Diagnosis Date  . Asthma   . CHF (congestive heart failure) ([place]) 03/12/2014  . Depression   . Headache(784.0)   . Hyperlipidemia   . Hypertension   . Lung nodules   . Renal disorder   . Sarcoidosis ([place])   . Ulcer ([name])    left posterior calf        Family History  Problem Relation Age of Onset  . Heart failure Mother   . Diabetes Mellitus II Mother   . Hypertension Mother   . Cancer Mother     unknown type  . Heart disease Father   . Stroke Father   . Diabetes Mellitus II Father     Social History:  reports that she has never smoked. She has never used smokeless tobacco. She reports that she does not drink alcohol or use drugs.   Allergies: No Known Allergies  Medications Prior to Admission  Medication Sig Dispense Refill  . albuterol (PROVENTIL HFA;VENTOLIN HFA) 108 (90 BASE) MCG/ACT inhaler Inhale 2 puffs into the lungs every 6 (six) hours as needed for shortness of breath.    Marland Kitchen amLODipine (NORVASC) 5 MG tablet Take 5 mg by mouth every  morning.     Marland Kitchen aspirin EC 81 MG tablet Take 81 mg by mouth daily.    . furosemide (LASIX) 40 MG tablet Take 1 tablet (40 mg total) by mouth daily. 12 tablet 0  . ibuprofen (ADVIL,MOTRIN) 800 MG tablet Take 800 mg by mouth every 6 (six) hours as needed for moderate pain.    . potassium chloride SA (K-DUR,KLOR-CON) 20 MEQ tablet Take 1 tablet (20 mEq total) by mouth daily. 7 tablet 0  . traMADol (ULTRAM) 50 MG tablet Take 50 mg by mouth every 6 (six) hours as needed for moderate pain or severe pain.    . Wound Dressings (SILVASORB) GEL Apply twice daily with dressing changes (Patient taking differently: Apply 1 application topically 2 (two) times a week. Apply twice daily with dressing changes) 1 Tube 3       WJX:BJYNW from the symptoms mentioned above,there are no other symptoms referable to all systems reviewed.  Marland Kitchen aspirin EC  81 mg Oral Daily  . enoxaparin (LOVENOX) injection  65 mg Subcutaneous Q24H  . feeding supplement (ENSURE ENLIVE)  237 mL Oral BID BM  . feeding supplement (PRO-STAT SUGAR FREE 64)  30 mL Oral BID  . insulin aspart  0-15 Units Subcutaneous  TID WC  . insulin aspart  0-5 Units Subcutaneous QHS  . midodrine  5 mg Oral TID WC  . piperacillin-tazobactam (ZOSYN)  IV  3.375 g Intravenous Once  . piperacillin-tazobactam (ZOSYN)  IV  3.375 g Intravenous Q8H  . potassium chloride  10 mEq Intravenous Q1 Hr x 4  . potassium chloride  40 mEq Oral TID  . SILVASORB  1 application Topical BID  . sodium chloride flush  3 mL Intravenous Q12H  . vancomycin  1,500 mg Intravenous Q24H     Physical Exam: Vital signs in last 24 hours: Temp:  [97.9 F (36.6 C)-98.5 F (36.9 C)] 98.3 F (36.8 C) (12/26 0535) Pulse Rate:  [82-96] 82 (12/26 0535) Resp:  [16-25] 20 (12/26 0535) BP: (99-127)/(49-76) 99/54 (12/26 0535) SpO2:  [97 %-100 %] 97 % (12/26 0535) Weight:  [288 lb (130.6 kg)-320 lb (145.2 kg)] 288 lb (130.6 kg) (12/26 0009) Weight change:  Last BM Date:  12/16/16  Intake/Output from previous day: 12/25 0701 - 12/26 0700 In: 1545.5 [P.O.:240; I.V.:3; IV Piggyback:1302.5] Out: 500 [Urine:500] Total I/O In: 240 [P.O.:240] Out: -    Physical Exam: General- pt is awake,alert, oriented to time place and person Resp- No acute REsp distress, CTA B/L NO Rhonchi CVS- S1S2 regular in rate and rhythm GIT- BS+, soft, NT, ND EXT- NO LE Edema, no Cyanosis, bandages in situ, chronic venous stasis changes present  CNS- CN 2-12 grossly intact. Moving all 4 extremities Psych- normal mood and affect    Lab Results: CBC  Recent Labs  12/17/16 2055 12/18/16 0615  WBC 9.7 8.8  HGB 9.8* 8.6*  HCT 30.1* 26.1*  PLT 366 332    BMET  Recent Labs  12/17/16 2055 12/18/16 0615  NA 135 137  K 2.6* 2.7*  CL 103 108  CO2 21* 20*  GLUCOSE 145* 118*  BUN 35* 32*  CREATININE 2.62* 2.25*  CALCIUM 9.6 8.9   Creat trend 2017  2.6==>2.2 2014  6.24=>1.91 ( AKI)   MICRO Recent Results (from the past 240 hour(s))  Culture, blood (routine x 2)     Status: None (Preliminary result)   Collection Time: 12/18/16 12:34 AM  Result Value Ref Range Status   Specimen Description BLOOD LEFT ANTECUBITAL  Final   Special Requests BOTTLES DRAWN AEROBIC AND ANAEROBIC 6CC EACH  Final   Culture NO GROWTH < 12 HOURS  Final   Report Status PENDING  Incomplete  Culture, blood (routine x 2)     Status: None (Preliminary result)   Collection Time: 12/18/16 12:48 AM  Result Value Ref Range Status   Specimen Description BLOOD LEFT ANTECUBITAL  Final   Special Requests   Final    BOTTLES DRAWN AEROBIC AND ANAEROBIC AEB 6CC ANA 4CC   Culture NO GROWTH < 12 HOURS  Final   Report Status PENDING  Incomplete      Lab Results  Component Value Date   CALCIUM 8.9 12/18/2016   CAION 1.21 03/12/2014   PHOS 5.4 (H) 10/11/2013      Impression: 1)Renal  AKI secondary to Prerenal/ATN/ Post renal                AKI sec to Hypovolemia/ Sepsis/NSAIDS                 Data in favor of prerenal                   Fena less than 1  CKD stage 2.?                   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 Phosphorus at goal.   5)ID-admitted with sepsis. On broad spectrum abx Primary MD following  6)Electrolytes Hypokalemic NOrmonatremic    7)Acid base Co2 not at goal     Plan:  Agree with current tx and plan Agree with holding nsaids Will start IVF at gentle rate Will suggest to follow vanco levels Will suggest ct scan without contrast to look at the cystic mass      BHUTANI,MANPREET S 12/18/2016, 9:46 AM

## 2016-12-18 NOTE — Consult Note (Signed)
[place] Nurse wound consult note Reason for Consult:Chronic venous stasis ulcers.  Unna boots application after MD debridement at bedside.   Wound type:Chronic venous stasis.  Wears twice weekly compression, applied by [facility] nurse.  Pressure Ulcer POA: N/A Measurement: 12 cm x 10 cm circumferentially to right anterior lower leg above malleolus.  100% yellow, devitalized tissue present.  MD to debride this  LEft lower leg above lateral malleolus:  8 cm x 6 cm with 50% devitalized tissue present.    Wound YHC:WCBJ:  Ruddy red and devitalized tissue.   Right  100% yellow fibrin slough and devitalized slough Drainage (amount, consistency, odor) Moderate purulent drainage.  Periwound:Edema, chronic skin changes.  Dressing procedure/placement/frequency:After MD debrides  Cleanse lower legs with soap and water and pat gently dry. Topical wound care per MD order (Aquacel AG) Apply zinc layer, secured with kerlix and self adherent Coban layer.  Change three times weekly.  Will not follow at this time.  Please re-consult if needed.  Domenic Moras RN BSN [place] Pager [phone]

## 2016-12-18 NOTE — Care Management Important Message (Signed)
Important Message  Patient Details  Name: Kari [name] MRN: [id number] Date of Birth: [birth year]   Medicare Important Message Given:  Yes    Sherald Barge, RN 12/18/2016, 10:10 AM

## 2016-12-18 NOTE — Progress Notes (Signed)
CRITICAL VALUE ALERT  Critical value received:  Potassium 2.7  Date of notification:  12/18/16  Time of notification:  0655  Critical value read back:Yes.    Nurse who received alert:  Elmyra Ricks, RN  MD notified (1st page):  Dr. Candiss Norse already aware  Time of first page:  0725   MD notified (2nd page):  Time of second page:  Responding MD:  Dr. Candiss Norse  Time MD responded:  [phone]

## 2016-12-18 NOTE — Consult Note (Addendum)
SURGICAL CONSULTATION NOTE (initial) - cpt: 79024, 11042, 11045 x4, [phone]  HISTORY OF PRESENT ILLNESS (HPI):  55 y.o. obese, diabetic female presented to [facility] after she became dizzy and lost consciousness while washing dishes, at which time she slid back onto her rolling walker without falling or incurring any trauma to her head or otherwise.   Upon ED presentation, she was noted to have severe R>L lower extremity edema and foul-smelling ulcerations that patient reports have been present 3 - 4 years since she completed Unna boot therapy with [facility], but she says she was unable to afford compression stockings. She adds that she sometimes elevates her legs in a recliner chair and has since been seen by visiting home nursing/wound care, who "applies cream and a dressing" to her wounds without compression or debridement. 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 She currently denies any pain fever/chills, CP, or SOB and says her edema has improved since admission.  Surgery is consulted by medical physician Dr. Dena Billet in this context for evaluation and management of venous stasis wounds.  PAST MEDICAL HISTORY (PMH):  Past Medical History:  Diagnosis Date  . Asthma   . CHF (congestive heart failure) ([place]) 03/12/2014  . Depression   . Headache(784.0)   . Hyperlipidemia   . Hypertension   . Lung nodules   . Renal disorder   . Sarcoidosis ([place])   . Ulcer ([place])    left posterior calf     PAST SURGICAL HISTORY ([place]):  Past Surgical History:  Procedure Laterality Date  . cataract surgery Left 07-2013  . NO PAST SURGERIES       MEDICATIONS:  Prior to Admission medications   Medication Sig Start Date End Date Taking? Authorizing Provider  albuterol (PROVENTIL HFA;VENTOLIN HFA)  108 (90 BASE) MCG/ACT inhaler Inhale 2 puffs into the lungs every 6 (six) hours as needed for shortness of breath.   Yes Historical Provider, MD  amLODipine (NORVASC) 5 MG tablet Take 5 mg by mouth every morning.    Yes Historical Provider, MD  aspirin EC 81 MG tablet Take 81 mg by mouth daily.   Yes Historical Provider, MD  furosemide (LASIX) 40 MG tablet Take 1 tablet (40 mg total) by mouth daily. 10/21/15  Yes Quintella Reichert, MD  ibuprofen (ADVIL,MOTRIN) 800 MG tablet Take 800 mg by mouth every 6 (six) hours as needed for moderate pain.   Yes Historical Provider, MD  potassium chloride SA (K-DUR,KLOR-CON) 20 MEQ tablet Take 1 tablet (20 mEq total) by mouth daily. 01/11/16  Yes Milton Ferguson, MD  traMADol (ULTRAM) 50 MG tablet Take 50 mg by mouth every 6 (six) hours as needed for moderate pain or severe pain.   Yes Historical Provider, MD  Wound Dressings (SILVASORB) GEL Apply twice daily with dressing changes Patient taking differently: Apply 1 application topically 2 (two) times a week. Apply twice daily with dressing changes 10/21/15  Yes Quintella Reichert, MD     ALLERGIES:  No Known Allergies   SOCIAL HISTORY:  Social History   Social History  . Marital status: Single    Spouse name: N/A  . Number of children: 1  . Years of education: N/A   Occupational History  . Not on file.   Social History Main Topics  .  Smoking status: Never Smoker  . Smokeless tobacco: Never Used  . Alcohol use No  . Drug use: No  . Sexual activity: Not Currently   Other Topics Concern  . Not on file   Social History Narrative   Lives alone.    The patient currently resides (home / rehab facility / nursing home): Home  The patient normally is (ambulatory / bedbound): Ambulatory with walker   FAMILY HISTORY:  Family History  Problem Relation Age of Onset  . Heart failure Mother   . Diabetes Mellitus II Mother   . Hypertension Mother   . Cancer Mother     unknown type  . Heart disease Father    . Stroke Father   . Diabetes Mellitus II Father      REVIEW OF SYSTEMS:  Constitutional: denies weight loss, fever, chills, or sweats  Eyes: denies any other vision changes, history of eye injury  ENT: denies sore throat, hearing problems  Respiratory: denies shortness of breath, wheezing  Cardiovascular: denies chest pain, palpitations  Gastrointestinal: denies abdominal pain, N/V, or diarrhea Genitourinary: denies burning with urination or urinary frequency Musculoskeletal: denies any other joint pains or cramps  Skin: bilateral lower extremity edema and wounds as per HPI  Neurological: denies any other headache, dizziness, weakness  Psychiatric: denies any other depression, anxiety   All other review of systems were negative   VITAL SIGNS:  Temp:  [97.9 F (36.6 C)-98.5 F (36.9 C)] 98.3 F (36.8 C) (12/26 0535) Pulse Rate:  [82-96] 82 (12/26 0535) Resp:  [16-25] 20 (12/26 0535) BP: (99-127)/(49-76) 99/54 (12/26 0535) SpO2:  [97 %-100 %] 97 % (12/26 0535) Weight:  [130.6 kg (288 lb)-145.2 kg (320 lb)] 130.6 kg (288 lb) (12/26 0009)     Height: 5\' 7"  (170.2 cm) Weight: 130.6 kg (288 lb) BMI (Calculated): 45.2   INTAKE/OUTPUT:  This shift: Total I/O In: 240 [P.O.:240] Out: -   Last 2 shifts: @IOLAST2SHIFTS @   PHYSICAL EXAM:  Constitutional:  -- Obese body habitus  -- Awake, alert, and oriented x3  Eyes:  -- Pupils equally round and reactive to light  -- No scleral icterus  Ear, nose, and throat:  -- No jugular venous distension  Pulmonary:  -- No crackles  -- Equally decreased breath sounds bilaterally, attributable to body habitus -- Breathing non-labored at rest Cardiovascular:  -- S1, S2 present  -- No pericardial rubs Gastrointestinal:  -- Abdomen soft, nontender, nondistended, no guarding/rebound  -- No abdominal masses appreciated, pulsatile or otherwise  Musculoskeletal and Integumentary:  -- Wounds or skin discoloration: Right lower extremity  foul-smelling 12 cm x 10 cm supramalleolar tender-to-touch chronic venous stasis-associated ulceration with diffuse yellow and dark green exudate; Left lower extremity foul-smelling 8 cm x 6 cm supramalleolar tender-to-touch chronic venous stasis-associated ulceration with partially diffuse (50%) yellow exudative devitalized slough and severe partially diffuse (the remaining 50%) cutaneous hypertrophy -- Extremities: B/L UE and LE FROM, hands and feet warm, massive B/L 3+ lower extremity edema  Neurologic:  -- Motor function: intact and symmetric -- Sensation: intact and symmetric  Pulse/Doppler Exam: (p=palpable; d=doppler signals; 0=none)     Right   Left   Fem  p   p   DP  p  p  Labs:  CBC Latest Ref Rng & Units 12/18/2016 12/17/2016 01/11/2016  WBC 4.0 - 10.5 K/uL 8.8 9.7 7.8  Hemoglobin 12.0 - 15.0 g/dL 8.6(L) 9.8(L) 11.2(L)  Hematocrit 36.0 - 46.0 % 26.1(L) 30.1(L) 36.0  Platelets 150 -  400 K/uL 332 366 308   BMP Latest Ref Rng & Units 12/18/2016 12/17/2016 01/11/2016  Glucose 65 - 99 mg/dL 118(H) 145(H) 101(H)  BUN 6 - 20 mg/dL 32(H) 35(H) 17  Creatinine 0.44 - 1.00 mg/dL 2.25(H) 2.62(H) 0.86  Sodium 135 - 145 mmol/L 137 135 140  Potassium 3.5 - 5.1 mmol/L 2.7(LL) 2.6(LL) 2.9(L)  Chloride 101 - 111 mmol/L 108 103 102  CO2 22 - 32 mmol/L 20(L) 21(L) 31  Calcium 8.9 - 10.3 mg/dL 8.9 9.6 8.7(L)    Imaging studies:  Left Leg Tibia/Fibula X-ray (08/22/2015) Negative for acute fracture, dislocation or radiopaque foreign body. There is mild benign periosteal thickening which can be seen with chronic venous stasis but it is nonspecific. There is no bone lesion or bony destruction. There is no soft tissue gas. Mild benign periosteal  thickening is nonspecific, but can be seen with chronic venous  stasis. No soft tissue gas or soft tissue foreign body.  Assessment/Plan: (ICD-10's: I44.313) 55 y.o. female with severe B/L chronic/recurrent lower extremity venous stasis-associated  ulceration with cutaneous infection, complicated by AKI, poor compliance, and pertinent comorbidities including morbid obesity (BMI > 45), diabetes mellitus, HTN, HLD, CHF, CKD, sarcoidosis, asthma, and major depression disorder.   - antibiotics and medical management of comorbidities as per medical team  - needs outpatient appointment scheduled for Thursday, 1/4 for follow-up and Unna boot change  - sharp combined non-excisional and excisional debridement of B/L lower extremity necrotic slough and devitalized tissue (B/L calves, nearly circumferential, R > L) to the level/depth of subcutaneous tissue using scissors and forceps  - B/L Aquacel AG applied directly to wounds with zinc oxide (Unna boots) wrap, Coban, and ACE compression wraps  - will plan to repeat lower extremity venous reflux study performed ~4 years ago (will need to coordinate with outpatient dressing changes) ...>4 years ago, previously demonstrated deep venous reflux without correctable superficial reflux  - nursing assistance with wound care and dressing application appreciated  - wound care supplies from wound care RN appreciated  - patient instructed to call office if questions/concerns  - elevate B/L lower extremities above heart level  - DVT prophylaxis  All of the above findings and recommendations were discussed with the patient and her medical physician, and all of patient's questions were answered to her expressed satisfaction.  Thank you for the opportunity to participate in this patient's care.   -- Marilynne Drivers Rosana Hoes, MD, [place]: [facility] Office: [phone]

## 2016-12-18 NOTE — Care Management Note (Signed)
Case Management Note  Patient Details  Name: Kari [name] MRN: [id number] Date of Birth: [birth year]  Subjective/Objective:                  Pt admitted with AKI. She is from home, lives alone and has family who care for her. She has PCP, uses a walker with ambulation. She is active with [facility] for nursing and wound care. Pt plans on returning home with resumption of [place] services at [facility]. Miguel Dibble is aware of admission to hospital.   Action/Plan: CM will cont to follow. Pt will need order to resume [place] services at [facility].  Expected Discharge Date:     12/20/2016             Expected Discharge Plan:  [place]  In-House Referral:  NA  Discharge planning Services  CM Consult  Post Acute Care Choice:  Home Health, Resumption of Svcs/PTA Provider Choice offered to:  Patient  HH Arranged:  RN [facility] Agency:  [place]  Status of Service:  In process, will continue to follow Sherald Barge, RN 12/18/2016, 11:10 AM

## 2016-12-18 NOTE — Progress Notes (Signed)
PROGRESS NOTE                                                                                                                                                                                                             Patient Demographics:    Kari [name], is a 55 y.o. female, DOB - [birth year], YSA:[id number]  Admit date - 12/17/2016   Admitting Physician Tacey Ruiz, MD  Outpatient Primary MD for the patient is Maggie Font, MD  LOS - 1  Chief Complaint  Patient presents with  . Near Syncope       Brief Narrative     The patient is a 55 yo woman with DM type 2, venous stasis ulcers on legs, was admitted to the hospital for an episode of syncope, bilateral lower extremity venous stasis ulcers with suspicion for infection along with ARF.   Subjective:    Kari [name] today has, No headache, No chest pain, No abdominal pain - No Nausea, No new weakness tingling or numbness, No Cough - SOB.     Assessment  & Plan :     1.Syncope. Low blood pressures upon admission, question if she is intravascularly dehydrated despite edema due to venous stasis, check orthostasis, hold Lasix, and protein supplements and midodrine, if positive for orthostasis will require IV fluids in the short-term. Check echocardiogram, PT eval and monitor on telemetry.  2. Chronic lower extremity venous stasis with ulcers. Foul-smelling discharge upon admission, wound care and general surgery consulted, may require debridement, continue empiric antibiotics and follow cultures, she has already seen vascular surgeon Dr. Donnetta Hutching who recommended medical management for her venous stasis.  3. Hypokalemia. Replace IV and orally, repeat CMP and magnesium levels tomorrow.  4. History of sarcoidosis. Supportive care.  5. Edema. UA has mild proteinuria, check CMP for albumin, place on protein supplements, most likely her edema is due to combination of  venous stasis and chronic diastolic dysfunction, question if she has pulmonary hypertension due to sarcoidosis will check echocardiogram. For now she has syncopized and hypotensive hence hold diuretics.  6. Mild proteinuria. ARF. Also has tricky fluid balance, will consult nephrology.     Family Communication  :  None  Code Status :  Full  Diet : Diet 2 gram sodium Room service appropriate? Yes; Fluid consistency: Thin   Disposition Plan  :  Home 2-3 days  Consults  :  Surge, W Care  Procedures  :    DVT Prophylaxis  :  Lovenox    Lab Results  Component Value Date   PLT 332 12/18/2016    Inpatient Medications  Scheduled Meds: . aspirin EC  81 mg Oral Daily  . enoxaparin (LOVENOX) injection  65 mg Subcutaneous Q24H  . feeding supplement (ENSURE ENLIVE)  237 mL Oral BID BM  . furosemide  40 mg Oral Daily  . insulin aspart  0-15 Units Subcutaneous TID WC  . insulin aspart  0-5 Units Subcutaneous QHS  . piperacillin-tazobactam (ZOSYN)  IV  3.375 g Intravenous Once  . piperacillin-tazobactam (ZOSYN)  IV  3.375 g Intravenous Q8H  . potassium chloride  10 mEq Intravenous Q1 Hr x 4  . potassium chloride  40 mEq Oral TID  . SILVASORB  1 application Topical BID  . sodium chloride flush  3 mL Intravenous Q12H  . vancomycin  1,500 mg Intravenous Q24H   Continuous Infusions: PRN Meds:.acetaminophen **OR** acetaminophen, albuterol, ondansetron **OR** ondansetron (ZOFRAN) IV, oxyCODONE, senna-docusate, traMADol, traZODone  Antibiotics  :    Anti-infectives    Start     Dose/Rate Route Frequency Ordered Stop   12/18/16 2200  vancomycin (VANCOCIN) 1,500 mg in sodium chloride 0.9 % 500 mL IVPB     1,500 mg 250 mL/hr over 120 Minutes Intravenous Every 24 hours 12/18/16 0810     12/18/16 1400  piperacillin-tazobactam (ZOSYN) IVPB 3.375 g     3.375 g 12.5 mL/hr over 240 Minutes Intravenous Every 8 hours 12/18/16 0809     12/18/16 0600  piperacillin-tazobactam (ZOSYN) IVPB 3.375 g      3.375 g 12.5 mL/hr over 240 Minutes Intravenous  Once 12/18/16 0143     12/18/16 0145  vancomycin (VANCOCIN) 500 mg in sodium chloride 0.9 % 100 mL IVPB     500 mg 100 mL/hr over 60 Minutes Intravenous  Once 12/18/16 0143 12/18/16 0348   12/17/16 2145  vancomycin (VANCOCIN) IVPB 1000 mg/200 mL premix     1,000 mg 200 mL/hr over 60 Minutes Intravenous  Once 12/17/16 2137 12/17/16 2343   12/17/16 2145  piperacillin-tazobactam (ZOSYN) IVPB 3.375 g     3.375 g 100 mL/hr over 30 Minutes Intravenous  Once 12/17/16 2137 12/17/16 2225         Objective:   Vitals:   12/17/16 2330 12/17/16 2345 12/18/16 0009 12/18/16 0535  BP: (!) 111/49  (!) 109/59 (!) 99/54  Pulse: 87 88 92 82  Resp: 19 22 20 20   Temp:   98.5 F (36.9 C) 98.3 F (36.8 C)  TempSrc:   Oral Oral  SpO2: 100% 99% 98% 97%  Weight:   130.6 kg (288 lb)   Height:   5\' 7"  (1.702 m)     Wt Readings from Last 3 Encounters:  12/18/16 130.6 kg (288 lb)  01/11/16 136.1 kg (300 lb)  10/21/15 136.1 kg (300 lb)     Intake/Output Summary (Last 24 hours) at 12/18/16 0848 Last data filed at 12/18/16 0700  Gross per 24 hour  Intake           1545.5 ml  Output              500 ml  Net           1045.5 ml     Physical Exam  Awake Alert, Oriented X 3, No new F.N deficits, Normal affect [place].AT,PERRAL Supple Neck,No JVD,  No cervical lymphadenopathy appriciated.  Symmetrical Chest wall movement, Good air movement bilaterally, CTAB RRR,No Gallops,Rubs or new Murmurs, No Parasternal Heave +ve B.Sounds, Abd Soft, No tenderness, No organomegaly appriciated, No rebound - guarding or rigidity. No Cyanosis, Clubbing, No new Rash or bruise  3+ bilateral lower extremity edema up to thighs, bilateral leg ulcers under bandage, please see wound care note for details   Data Review:    CBC  Recent Labs Lab 12/17/16 2055 12/18/16 0615  WBC 9.7 8.8  HGB 9.8* 8.6*  HCT 30.1* 26.1*  PLT 366 332  MCV 88.0 88.5  MCH 28.7 29.2    MCHC 32.6 33.0  RDW 16.9* 16.9*    Chemistries   Recent Labs Lab 12/17/16 2055 12/18/16 0034 12/18/16 0615  NA 135  --  137  K 2.6*  --  2.7*  CL 103  --  108  CO2 21*  --  20*  GLUCOSE 145*  --  118*  BUN 35*  --  32*  CREATININE 2.62*  --  2.25*  CALCIUM 9.6  --  8.9  MG 2.4 2.3  --    ------------------------------------------------------------------------------------------------------------------ No results for input(s): CHOL, HDL, LDLCALC, TRIG, CHOLHDL, LDLDIRECT in the last 72 hours.  Lab Results  Component Value Date   HGBA1C 5.5 03/12/2014   ------------------------------------------------------------------------------------------------------------------  Recent Labs  12/17/16 2055  TSH 5.312*   ------------------------------------------------------------------------------------------------------------------  Recent Labs  12/18/16 0034  RETICCTPCT 2.1    Coagulation profile No results for input(s): INR, PROTIME in the last 168 hours.  No results for input(s): DDIMER in the last 72 hours.  Cardiac Enzymes  Recent Labs Lab 12/17/16 2055 12/18/16 0034 12/18/16 0619  TROPONINI <0.03 <0.03 <0.03   ------------------------------------------------------------------------------------------------------------------    Component Value Date/Time   BNP 56.0 12/18/2016 0034   BNP 178.3 (H) 03/31/2014 1010    Micro Results Recent Results (from the past 240 hour(s))  Culture, blood (routine x 2)     Status: None (Preliminary result)   Collection Time: 12/18/16 12:34 AM  Result Value Ref Range Status   Specimen Description BLOOD LEFT ANTECUBITAL  Final   Special Requests BOTTLES DRAWN AEROBIC AND ANAEROBIC 6CC EACH  Final   Culture NO GROWTH < 12 HOURS  Final   Report Status PENDING  Incomplete  Culture, blood (routine x 2)     Status: None (Preliminary result)   Collection Time: 12/18/16 12:48 AM  Result Value Ref Range Status   Specimen  Description BLOOD LEFT ANTECUBITAL  Final   Special Requests   Final    BOTTLES DRAWN AEROBIC AND ANAEROBIC AEB 6CC ANA 4CC   Culture NO GROWTH < 12 HOURS  Final   Report Status PENDING  Incomplete    Radiology Reports No results found.  Time Spent in minutes  30   Lala Lund K M.D on 12/18/2016 at 8:48 AM  Between 7am to 7pm - Pager - [phone]  After 7pm go to [email or web address] - password [email or web address]  [facility] -  Office  [phone]

## 2016-12-19 DIAGNOSIS — L03115 Cellulitis of right lower limb: Secondary | ICD-10-CM

## 2016-12-19 DIAGNOSIS — L02415 Cutaneous abscess of right lower limb: Secondary | ICD-10-CM

## 2016-12-19 LAB — HEMOGLOBIN A1C
Hgb A1c MFr Bld: 5.6 % (ref 4.8–5.6)
Mean Plasma Glucose: 114 mg/dL

## 2016-12-19 LAB — BASIC METABOLIC PANEL
Anion gap: 4 — ABNORMAL LOW (ref 5–15)
BUN: 27 mg/dL — ABNORMAL HIGH (ref 6–20)
CO2: 20 mmol/L — ABNORMAL LOW (ref 22–32)
Calcium: 8.6 mg/dL — ABNORMAL LOW (ref 8.9–10.3)
Chloride: 113 mmol/L — ABNORMAL HIGH (ref 101–111)
Creatinine, Ser: 1.77 mg/dL — ABNORMAL HIGH (ref 0.44–1.00)
GFR calc Af Amer: 36 mL/min — ABNORMAL LOW (ref >60)
GFR calc non Af Amer: 31 mL/min — ABNORMAL LOW (ref >60)
Glucose, Bld: 104 mg/dL — ABNORMAL HIGH (ref 65–99)
Potassium: 3.4 mmol/L — ABNORMAL LOW (ref 3.5–5.1)
Sodium: 137 mmol/L (ref 135–145)

## 2016-12-19 LAB — FOLATE RBC
Folate, Hemolysate: 282.4 ng/mL
Folate, RBC: 1062 ng/mL (ref >498)
Hematocrit: 26.6 % — ABNORMAL LOW (ref 34.0–46.6)

## 2016-12-19 LAB — CBC
HCT: 23.7 % — ABNORMAL LOW (ref 36.0–46.0)
Hemoglobin: 7.5 g/dL — ABNORMAL LOW (ref 12.0–15.0)
MCH: 28.5 pg (ref 26.0–34.0)
MCHC: 31.6 g/dL (ref 30.0–36.0)
MCV: 90.1 fL (ref 78.0–100.0)
Platelets: 305 10*3/uL (ref 150–400)
RBC: 2.63 MIL/uL — ABNORMAL LOW (ref 3.87–5.11)
RDW: 17.2 % — ABNORMAL HIGH (ref 11.5–15.5)
WBC: 8.2 10*3/uL (ref 4.0–10.5)

## 2016-12-19 LAB — GLUCOSE, CAPILLARY
Glucose-Capillary: 119 mg/dL — ABNORMAL HIGH (ref 65–99)
Glucose-Capillary: 112 mg/dL — ABNORMAL HIGH (ref 65–99)

## 2016-12-19 LAB — URINE CULTURE: Culture: NO GROWTH

## 2016-12-19 LAB — PROTIME-INR
INR: 1.24
Prothrombin Time: 15.7 seconds — ABNORMAL HIGH (ref 11.4–15.2)

## 2016-12-19 LAB — MAGNESIUM: Magnesium: 2.1 mg/dL (ref 1.7–2.4)

## 2016-12-19 MED ORDER — DOXYCYCLINE HYCLATE 100 MG PO TABS: 100.0000 mg | ORAL_TABLET | Freq: Two times a day (BID) | ORAL | 0 refills | Status: DC

## 2016-12-19 MED ORDER — OXYCODONE HCL 5 MG PO TABS: 5.0000 mg | ORAL_TABLET | Freq: Every day | ORAL | 0 refills | Status: DC | PRN

## 2016-12-19 NOTE — Progress Notes (Signed)
[place] Hospital Day(s): 2.   Post op day(s):  Marland Kitchen   Interval History: Patient seen and examined, no acute events or new complaints overnight. Patient reports post-debridement pain has been well-controlled, denies fever/chills, CP, or SOB. Of note, patient states that her Left lower extremity ACE wrap "fell off" last night, and it was reapplied starting at her Left ankle level, though the rest of the dressing remained intact.  Review of Systems:  Constitutional: denies fever, chills  HEENT: denies cough or congestion  Respiratory: denies any shortness of breath  Cardiovascular: denies chest pain or palpitations  Gastrointestinal: denies abdominal pain, N/V, or diarrhea Genitourinary: denies burning with urination or urinary frequency Musculoskeletal: denies pain, decreased motor or sensation Integumentary: denies any other rashes or skin discolorations except as previously described with dressing as per interval hx Neurological: denies HA or vision/hearing changes   Vital signs in last 24 hours: [min-max] current  Temp:  [98.3 F (36.8 C)-99.8 F (37.7 C)] 99.8 F (37.7 C) (12/27 0618) Pulse Rate:  [81] 81 (12/27 0618) Resp:  [20] 20 (12/27 0618) BP: (101)/(59) 101/59 (12/27 0618) SpO2:  [99 %-100 %] 99 % (12/27 0618) Weight:  [133.9 kg (295 lb 4.8 oz)] 133.9 kg (295 lb 4.8 oz) (12/27 0618)     Height: 5\' 7"  (170.2 cm) Weight: 133.9 kg (295 lb 4.8 oz) BMI (Calculated): 45.2   Intake/Output this shift:  No intake/output data recorded.   Intake/Output last 2 shifts:  @IOLAST2SHIFTS @   Physical Exam:  Constitutional: alert, cooperative and no distress  HENT: normocephalic without obvious abnormality  Eyes: PERRL, EOM's grossly intact and symmetric  Neuro: CN II - XII grossly intact and symmetric without deficit  Respiratory: breathing non-labored at rest  Cardiovascular: regular rate and sinus rhythm  Gastrointestinal: soft, obese, non-tender, and  non-distended  Musculoskeletal: UE and LE FROM, severe B/L lower extremity 3+ edema with B/L leg wrap dressings intact without significant drainage through dressings, motor and sensation grossly intact, NT   Labs:  CBC:  Lab Results  Component Value Date   WBC 8.2 12/19/2016   RBC 2.63 (L) 12/19/2016   BMP:  Lab Results  Component Value Date   GLUCOSE 104 (H) 12/19/2016   CO2 20 (L) 12/19/2016   BUN 27 (H) 12/19/2016   CREATININE 1.77 (H) 12/19/2016   CREATININE 0.83 03/31/2014   CALCIUM 8.6 (L) 12/19/2016     Imaging studies: No new pertinent imaging studies   Assessment/Plan: (ICD-10's: I71.313) 55 y.o. female doing overall well 1 Day s/p bedside debridement and application of B/L lower extremity Unna boot medicated compression dressings for severe B/L chronic/recurrent lower extremity venous stasis-associated ulceration with cutaneous infection , complicated by AKI, poor compliance, and pertinent comorbidities including morbid obesity (BMI > 45), diabetes mellitus, HTN, HLD, CHF, CKD, sarcoidosis, asthma, and major depression disorder.   - okay with discharge planning from surgical perspective             - antibiotics and medical management of comorbidities as per medical team             - needs outpatient appointment scheduled for Thursday, 1/4 for follow-up, wound care, and Unna boot change             - B/L Aquacel AG applied directly to wounds with zinc oxide (Unna boots) wrap, Coban, and ACE compression wraps             - will plan to repeat lower extremity venous  reflux study performed ~4 years ago (will need to coordinate with outpatient dressing changes) ...>4 years ago, previously demonstrated deep venous reflux without correctable superficial reflux             - patient instructed to call office if questions/concerns             - elevate B/L lower extremities above heart level  - weight loss encouraged             - DVT prophylaxis  All of the above findings  and recommendations were discussed with the patient and her medical physician, and all of patient's questions were answered to her expressed satisfaction.  Thank you for the opportunity to participate in this patient's care.   -- Marilynne Drivers Rosana Hoes, MD, [place]: [facility] Office: [phone]

## 2016-12-19 NOTE — Progress Notes (Signed)
Pt's IV catheter removed and intact. Discharge instructions including medications and follow up appointments were reviewed and discussed with patient. All questions were answered and no further questions at this time. Pt verbalized understanding of discharge instructions including medications and follow up appointments. Pt in stable condition and in no acute distress at time of discharge. Pt will be escorted by nurse tech.

## 2016-12-19 NOTE — Progress Notes (Signed)
Kari [name]  MRN: [id number]  DOB/AGE: 1961-06-26 55 y.o.  Primary [place], MD  Admit date: 12/17/2016  Chief Complaint:  Chief Complaint  Patient presents with  . Near Syncope    S-Pt presented on  12/17/2016 with  Chief Complaint  Patient presents with  . Near Syncope  .    Pt today feels better.   Marland Kitchen aspirin EC  81 mg Oral Daily  . enoxaparin (LOVENOX) injection  65 mg Subcutaneous Q24H  . feeding supplement (ENSURE ENLIVE)  237 mL Oral BID BM  . feeding supplement (PRO-STAT SUGAR FREE 64)  30 mL Oral BID  . insulin aspart  0-15 Units Subcutaneous TID WC  . insulin aspart  0-5 Units Subcutaneous QHS  . midodrine  5 mg Oral TID WC  . piperacillin-tazobactam (ZOSYN)  IV  3.375 g Intravenous Q8H  . SILVASORB  1 application Topical BID  . sodium chloride flush  3 mL Intravenous Q12H  . vancomycin  1,500 mg Intravenous Q24H         AOZ:HYQMV from the symptoms mentioned above,there are no other symptoms referable to all systems reviewed.  Physical Exam: Vital signs in last 24 hours: Temp:  [98.3 F (36.8 C)-99.8 F (37.7 C)] 99.8 F (37.7 C) (12/27 0618) Pulse Rate:  [81] 81 (12/27 0618) Resp:  [20] 20 (12/27 0618) BP: (101)/(59) 101/59 (12/27 0618) SpO2:  [99 %-100 %] 99 % (12/27 0618) Weight:  [295 lb 4.8 oz (133.9 kg)] 295 lb 4.8 oz (133.9 kg) (12/27 0618) Weight change: -24 lb 11.2 oz (-11.204 kg) Last BM Date: 12/16/16  Intake/Output from previous day: 12/26 0701 - 12/27 0700 In: 3163.8 [P.O.:1080; I.V.:1083.8; IV Piggyback:1000] Out: 1100 [Urine:1100] No intake/output data recorded.   Physical Exam: General- pt is awake,alert, oriented to time place and person Resp- No acute REsp distress, CTA B/L NO Rhonchi CVS- S1S2 regular ij rate and rhythm GIT- BS+, soft, NT, ND EXT- NO LE Edema, Cyanosis   Lab Results: CBC  Recent Labs  12/18/16 0615 12/19/16 0438  WBC 8.8 8.2  HGB 8.6* 7.5*  HCT 26.1* 23.7*  PLT 332  305    BMET  Recent Labs  12/18/16 1038 12/19/16 0438  NA 136 137  K 2.8* 3.4*  CL 108 113*  CO2 21* 20*  GLUCOSE 117* 104*  BUN 31* 27*  CREATININE 2.12* 1.77*  CALCIUM 8.8* 8.6*   Creat trend 2017  2.6==>2.2==>1.71 2014  6.24=>1.91 ( AKI)    MICRO Recent Results (from the past 240 hour(s))  Culture, blood (routine x 2)     Status: None (Preliminary result)   Collection Time: 12/18/16 12:34 AM  Result Value Ref Range Status   Specimen Description BLOOD LEFT ANTECUBITAL  Final   Special Requests BOTTLES DRAWN AEROBIC AND ANAEROBIC 6CC EACH  Final   Culture NO GROWTH < 12 HOURS  Final   Report Status PENDING  Incomplete  Culture, blood (routine x 2)     Status: None (Preliminary result)   Collection Time: 12/18/16 12:48 AM  Result Value Ref Range Status   Specimen Description BLOOD LEFT ANTECUBITAL  Final   Special Requests   Final    BOTTLES DRAWN AEROBIC AND ANAEROBIC AEB 6CC ANA 4CC   Culture NO GROWTH < 12 HOURS  Final   Report Status PENDING  Incomplete  Culture, Urine     Status: None   Collection Time: 12/18/16  1:07 AM  Result Value Ref Range Status  Specimen Description URINE, CLEAN CATCH  Final   Special Requests NONE  Final   Culture NO GROWTH Performed at [facility]   Final   Report Status 12/19/2016 FINAL  Final      Lab Results  Component Value Date   CALCIUM 8.6 (L) 12/19/2016   CAION 1.21 03/12/2014   PHOS 5.4 (H) 10/11/2013               Impression: 1)Renal  AKI secondary to Prerenal/ATN/ Post renal                AKI sec to Hypovolemia/ Sepsis/NSAIDS                Data in favor of prerenal                   Fena less than 1                                   CKD stage 2.?                   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 Phosphorus at goal.   5)ID-admitted with sepsis. On broad spectrum abx Primary MD following  6)Electrolytes Hypokalemic     Better  NOrmonatremic    7)Acid base Co2 on lower side but stable   Plan:  Will d/c IVF Will follow bmet     [place] S 12/19/2016, 8:30 AM

## 2016-12-19 NOTE — Care Management Note (Signed)
Case Management Note  Patient Details  Name: Kari [name] MRN: [id number] Date of Birth: 08-13-1961  Expected Discharge Date:        12/19/2016          Expected Discharge Plan:  [place]  In-House Referral:  NA  Discharge planning Services  CM Consult  Post Acute Care Choice:  Home Health, Resumption of Svcs/PTA Provider Choice offered to:  Patient  DME Arranged:    DME Agency:     HH Arranged:  RN [place] Agency:  [place]  Status of Service:  Completed, signed off  If discussed at [place] of Stay Meetings, dates discussed:    Additional Comments: Patient discharging home today with resumption of [place] services through [place] [facility] [place] notified.   Delrick Dehart, Chauncey [place], RN 12/19/2016, 12:29 PM

## 2016-12-19 NOTE — Evaluation (Signed)
Physical Therapy Evaluation Patient Details Name: TAITE BALDASSARI MRN: [id number] DOB: [birth year] Today's Date: 12/19/2016   History of Present Illness  The patient is a 55 yo woman with DM type 2, venous stasis ulcers on legs, who started to feel dizzy today around 7 pm and then was witnessed by family to lose consciousness. She was in the kitchen doing dishes and then she lost consciousness and sat back onto her rolling walker. She remembers feeling dizzy and hot right before it happened. Lasted approximately 5 minutes. Additional history provided by the patient's sister and brother.  Clinical Impression  Patient found upright in chair, pleasant/energetic and willing to participate with skilled PT services today. Patient able to perform all mobility on an independent-mod(I) basis today, including functional transfers and gait. No significant balance deficits noted today as patient able to stand and don hospital gown with no assist from PT or assistive device. Ambulated approximately 274f in hospital hallways with walker and mod(I). Patient is not in need of skilled PT services during her hospital stay; MD/nursing staff are currently managing LE wounds/wound care. Patient wishes to return to wound care with her already established HHPT services. Patient educated that she is free to walk hallways with nurses and family members with walker; provided patient a "loaner" walker for mobility while she is still here in the hospital. Patient left sitting upright in chair with all needs met, no further skilled PT services required during this hospital stay.     Follow Up Recommendations Home health PT;Other (comment) (continuation of HHPT for wound care )    Equipment Recommendations  None recommended by PT    Recommendations for Other Services       Precautions / Restrictions Precautions Precautions: None Restrictions Weight Bearing Restrictions: No      Mobility  Bed Mobility                General bed mobility comments: DNT, patient upright in chair at eval   Transfers Overall transfer level: Independent Equipment used: None                Ambulation/Gait Ambulation/Gait assistance: Modified independent (Device/Increase time) Ambulation Distance (Feet): 250 Feet Assistive device: Rolling walker (2 wheeled) Gait Pattern/deviations: WFL(Within Functional Limits)   Gait velocity interpretation: <1.8 ft/sec, indicative of risk for recurrent falls General Gait Details: reduced gait speed   Stairs            Wheelchair Mobility    Modified Rankin (Stroke Patients Only)       Balance Overall balance assessment: No apparent balance deficits (not formally assessed)                                           Pertinent Vitals/Pain Pain Assessment: 0-10 Pain Score: 6  Pain Location: B LEs from wounds  Pain Descriptors / Indicators: Aching;Nagging Pain Intervention(s): Monitored during session    Home Living Family/patient expects to be discharged to:: Private residence Living Arrangements: Other relatives Available Help at Discharge: Family Type of Home: Apartment Home Access: Level entry     Home Layout: One level        Prior Function Level of Independence: Independent with assistive device(s)               Hand Dominance        Extremity/Trunk Assessment   Upper Extremity Assessment Upper  Extremity Assessment: Defer to OT evaluation    Lower Extremity Assessment Lower Extremity Assessment: Overall WFL for tasks assessed    Cervical / Trunk Assessment Cervical / Trunk Assessment: Normal  Communication   Communication: No difficulties  Cognition Arousal/Alertness: Awake/alert Behavior During Therapy: WFL for tasks assessed/performed Overall Cognitive Status: Within Functional Limits for tasks assessed                      General Comments General comments (skin integrity, edema, etc.):  patient very pleasant, no significant deficits noted     Exercises     Assessment/Plan    PT Assessment All further PT needs can be met in the next venue of care  PT Problem List Other (comment) (non-healing wounds, present but not evaluated by DPT today as nursing staff is managing these )          PT Treatment Interventions      PT Goals (Current goals can be found in the Care Plan section)  Acute Rehab PT Goals Patient Stated Goal: to go home  PT Goal Formulation: With patient Time For Goal Achievement: 01/02/17 Potential to Achieve Goals: Good    Frequency     Barriers to discharge        Co-evaluation               End of Session Equipment Utilized During Treatment: Gait belt Activity Tolerance: Patient tolerated treatment well Patient left: in chair;with call bell/phone within reach      Functional Assessment Tool Used: Based on skilled clinical assessment of gait, functional mobility, transfers, balance  Functional Limitation: Mobility: Walking and moving around Mobility: Walking and Moving Around Current Status [phone]): 0 percent impaired, limited or restricted Mobility: Walking and Moving Around Goal Status [phone]): 0 percent impaired, limited or restricted Mobility: Walking and Moving Around Discharge Status [phone]): 0 percent impaired, limited or restricted    Time: 4081-4481 PT Time Calculation (min) (ACUTE ONLY): 23 min   Charges:   PT Evaluation $PT Eval Low Complexity: 1 Procedure     PT G Codes:   PT G-Codes **NOT FOR INPATIENT CLASS** Functional Assessment Tool Used: Based on skilled clinical assessment of gait, functional mobility, transfers, balance  Functional Limitation: Mobility: Walking and moving around Mobility: Walking and Moving Around Current Status (E5631): 0 percent impaired, limited or restricted Mobility: Walking and Moving Around Goal Status (S9702): 0 percent impaired, limited or restricted Mobility: Walking and Moving  Around Discharge Status (O3785): 0 percent impaired, limited or restricted    Deniece Ree PT, DPT [phone]

## 2016-12-19 NOTE — Progress Notes (Signed)
Nutrition Brief Note  Patient identified on the Malnutrition Screening Tool (MST) Report  Wt Readings from Last 15 Encounters:  12/19/16 295 lb 4.8 oz (133.9 kg)  01/11/16 300 lb (136.1 kg)  10/21/15 300 lb (136.1 kg)  09/09/15 287 lb (130.2 kg)  08/22/15 (!) 320 lb (145.2 kg)  08/03/15 (!) 330 lb (149.7 kg)  03/11/15 300 lb (136.1 kg)  06/03/14 (!) 319 lb (144.7 kg)  04/10/14 (!) 317 lb (143.8 kg)  03/26/14 (!) 317 lb (143.8 kg)  03/15/14 (!) 303 lb 12.7 oz (137.8 kg)  11/17/13 293 lb 4.8 oz (133 kg)  11/03/13 289 lb (131.1 kg)  10/08/13 (!) 318 lb (144.2 kg)  01/05/13 (!) 319 lb (144.7 kg)   Body mass index is 46.25 kg/m. Patient meets criteria for Morbidly obese based on current BMI.   Pt had screened positive on MST report secondary to wt loss and poor appetite. Her weight appears fairly stable, though she has significant edema. Wt loss would obviously be beneficial if done correctly.   Pt's PO intake has been excellent, consistently eating 50-100% of meals. On RD arrival, lunch tray in front of her is 100% eaten.    Pt is discharging in near future. She has questions about what diet she should follow when she goes home. She asks if she should stop drinking soda.   RD focused on diet for wound healing. Currently, she is eating only 1 meal a day (dinner). She drinks 1 can of soda daily. She notes she bakes her food and eats a very large amount of baked chicken. Sounds to eat little else.   RD discussed optimal diet for wound healing. Advised her to start consuming more then 1 meal per day. She is told she cannot absorb but so much protein at a time, so she needs to eat more then 1 meal a day in order to meet her needs. .  Her blood sugars seem to be well controlled, though stated that regular soda in general is a poor choice.   She likes Ensure and says she has these at home. Recommended consuming 1-2 per day.   RD gave handouts with high protein foods listed.   RD  suggested buying whey protein powder and sprinkling this on top of her foods. Pointed out listed high protein snacks.   She reports understanding.   Left handouts titled "Pressure Ulcer Nutrition Therapy" and "High calorie, High Protein Nutrition therapy".   Burtis Junes RD, LDN, CNSC Clinical Nutrition Pager: [phone] 12/19/2016 12:35 PM

## 2016-12-19 NOTE — Discharge Summary (Addendum)
Physician Discharge Summary  Kari [name] LSL:[id number] DOB: 1961-02-17 DOA: 12/17/2016  PCP: Maggie Font, MD  Admit date: 12/17/2016 Discharge date: 12/19/2016  Time spent: 45 minutes  Recommendations for Outpatient Follow-up:  -Will be discharged home today. -Will follow up with Dr. Rosana Hoes in 1 week for dressing changes. -Will need follow up with PCP in 1 week to recheck her renal function.  Discharge Diagnoses:  Principal Problem:   AKI (acute kidney injury) ([place]) Active Problems:   Sarcoidosis ([place])   Cellulitis and abscess of right lower extremity   Syncope and collapse   Peripheral edema   Hypokalemia   Anemia   Discharge Condition: Stable and improved  Filed Weights   12/17/16 2008 12/18/16 0009 12/19/16 0618  Weight: (!) 145.2 kg (320 lb) 130.6 kg (288 lb) 133.9 kg (295 lb 4.8 oz)    History of present illness:  As per Dr. Dena Billet on 12/25: The patient is a 55 yo woman with DM type 2, venous stasis ulcers on legs, who started to feel dizzy today around 7 pm and then was witnessed by family to lose consciousness. She was in the kitchen doing dishes and then she lost consciousness and sat back onto her rolling walker. She remembers feeling dizzy and hot right before it happened. Lasted approximately 5 minutes. Additional history provided by the patient's sister and brother. Onset: this evening at 7 pm. Duration: intermittent. Timing: lasted 5 min. Severity: severe in that she had complete loss of consciousness. Context: Was performing usual activities at home washing dishes. Location: generalized. Character/Quality: complete loss of consciousness; did not respond to family members Alleviated by: Nothing. Exacerbated by: Nothing. Associated Symptoms: No headache, slurred speech, acute focal weakness or numbness, facial droop. No loss of bowel or bladder control. Brother states he saw the patient clench her left fist and shake both arms briefly. Treatments:  none at home except usual medications.  She also has lower extremity ulcers that have been worsening. Onset: started about 3 years ago. Duration: Chronically present but worsening over the last 3 months. Reports the ulcers were almost healed about a year ago. Timing: constant. Severity: severe and bilateral. Worsening. Context: Gradually worsening over 3 months. Location: bilateral lower extremities. Radiation: none. Character/Quality: open wounds that are foul smelling.  Alleviated by: Nothing. Exacerbated by: Nothing. Associated Symptoms: No headache, slurred speech, acute focal weakness or numbness, facial droop. No loss of bowel or bladder control. Has lost 15 lbs intentionally over the last month. Treatments: none at home except usual medications. Has dressing changes twice per week.  ED Course: The patient was evaluated and found to have elevated creatinine and foul smelling massive open wounds/ulcers on both lower legs. She was started on IV vancomycin and Zosyn.    Hospital Course:   1. Syncope. Likely related to orthostatic hypotension and clincal dehydration.  2. Chronic lower extremity venous stasis with ulcers. Foul-smelling discharge upon admission, wound care and general surgery consulted, plan to keep unna boots on and follow up with surgery in 1 week for dressing change. Will be discharged on doxycycline for 1 week.  3. Hypokalemia. Replaced  4. History of sarcoidosis. Supportive care.   Procedures:  None   Consultations:  Surgery, Dr. Rosana Hoes  Discharge Instructions  Discharge Instructions    Diet - low sodium heart healthy    Complete by:  As directed    Increase activity slowly    Complete by:  As directed      Allergies  as of 12/19/2016   No Known Allergies     Medication List    STOP taking these medications   amLODipine 5 MG tablet Commonly known as:  NORVASC   furosemide 40 MG tablet Commonly known as:  LASIX   ibuprofen 800 MG  tablet Commonly known as:  ADVIL,MOTRIN     TAKE these medications   albuterol 108 (90 Base) MCG/ACT inhaler Commonly known as:  PROVENTIL HFA;VENTOLIN HFA Inhale 2 puffs into the lungs every 6 (six) hours as needed for shortness of breath.   aspirin EC 81 MG tablet Take 81 mg by mouth daily.   doxycycline 100 MG tablet Commonly known as:  VIBRA-TABS Take 1 tablet (100 mg total) by mouth 2 (two) times daily.   oxyCODONE 5 MG immediate release tablet Commonly known as:  Oxy IR/ROXICODONE Take 1 tablet (5 mg total) by mouth daily as needed for severe pain. Take 2 tablets before each dressing change   potassium chloride SA 20 MEQ tablet Commonly known as:  K-DUR,KLOR-CON Take 1 tablet (20 mEq total) by mouth daily.   SILVASORB Gel Apply twice daily with dressing changes What changed:  how much to take  how to take this  when to take this  additional instructions   traMADol 50 MG tablet Commonly known as:  ULTRAM Take 50 mg by mouth every 6 (six) hours as needed for moderate pain or severe pain.      No Known Allergies Follow-up Information    Maggie Font, MD. Schedule an appointment as soon as possible for a visit in 1 week(s).   Specialty:  Family Medicine Contact information: [address] [place]  [phone]            The results of significant diagnostics from this hospitalization (including imaging, microbiology, ancillary and laboratory) are listed below for reference.    Significant Diagnostic Studies: X-ray Chest Pa And Lateral  Result Date: 12/18/2016 CLINICAL DATA:  Syncopal episode 1 day ago, acute renal insufficiency EXAM: CHEST  2 VIEW COMPARISON:  Portable chest x-ray of 04/10/2014 FINDINGS: No active infiltrate or effusion is seen. Mediastinal and hilar contours are unremarkable. Cardiomegaly is stable. No acute bony abnormality is seen. IMPRESSION: Stable cardiomegaly.  No active lung disease. Electronically Signed    By: Ivar Drape M.D.   On: 12/18/2016 09:36   US Renal  Result Date: 12/18/2016 CLINICAL DATA:  Acute renal insufficiency, sepsis, hypertension EXAM: RENAL / URINARY TRACT ULTRASOUND COMPLETE COMPARISON:  Ultrasound the abdomen of 10/08/2013 FINDINGS: Right Kidney: Length: 13.3 cm. No hydronephrosis is seen. The renal parenchyma does appear to be somewhat echogenic suggesting possible chronic renal medical disease. Correlation with renal laboratory values is recommended. Left Kidney: Length: 11.8 cm. No hydronephrosis is noted. The echogenicity of the renal parenchyma is somewhat increased. Bladder: There is a large fluid filled structure within the pelvis possibly representing a fluid filled urinary bladder, similar to the prior study, but a large cystic pelvic mass cannot be excluded by ultrasound. Consider CT of the abdomen pelvis if further assessment is warranted. IMPRESSION: 1. No hydronephrosis. 2. Echogenic renal parenchyma suggests chronic renal medical disease. 3. Fluid filled structure within the mid pelvis may represent a distended urinary bladder but a large cystic pelvic mass cannot be excluded. Consider CT of the abdomen pelvis to assess further. Electronically Signed   By: Ivar Drape M.D.   On: 12/18/2016 09:34    Microbiology: Recent Results (from the past 240  hour(s))  Culture, blood (routine x 2)     Status: None (Preliminary result)   Collection Time: 12/18/16 12:34 AM  Result Value Ref Range Status   Specimen Description BLOOD LEFT ANTECUBITAL  Final   Special Requests BOTTLES DRAWN AEROBIC AND ANAEROBIC [place]  Final   Culture NO GROWTH < 12 HOURS  Final   Report Status PENDING  Incomplete  Culture, blood (routine x 2)     Status: None (Preliminary result)   Collection Time: 12/18/16 12:48 AM  Result Value Ref Range Status   Specimen Description BLOOD LEFT ANTECUBITAL  Final   Special Requests   Final    BOTTLES DRAWN AEROBIC AND ANAEROBIC AEB [place] ANA 4CC   Culture NO  GROWTH < 12 HOURS  Final   Report Status PENDING  Incomplete  Culture, Urine     Status: None   Collection Time: 12/18/16  1:07 AM  Result Value Ref Range Status   Specimen Description URINE, CLEAN CATCH  Final   Special Requests NONE  Final   Culture NO GROWTH Performed at [facility]   Final   Report Status 12/19/2016 FINAL  Final     Labs: Basic Metabolic Panel:  Recent Labs Lab 12/17/16 2055 12/18/16 0034 12/18/16 0615 12/18/16 1038 12/19/16 0438  NA 135  --  137 136 137  K 2.6*  --  2.7* 2.8* 3.4*  CL 103  --  108 108 113*  CO2 21*  --  20* 21* 20*  GLUCOSE 145*  --  118* 117* 104*  BUN 35*  --  32* 31* 27*  CREATININE 2.62*  --  2.25* 2.12* 1.77*  CALCIUM 9.6  --  8.9 8.8* 8.6*  MG 2.4 2.3  --   --  2.1   Liver Function Tests:  Recent Labs Lab 12/18/16 1038  AST 10*  ALT 8*  ALKPHOS 36*  BILITOT 0.3  PROT 7.1  ALBUMIN 2.2*   No results for input(s): LIPASE, AMYLASE in the last 168 hours. No results for input(s): AMMONIA in the last 168 hours. CBC:  Recent Labs Lab 12/17/16 2055 12/18/16 0615 12/19/16 0438  WBC 9.7 8.8 8.2  HGB 9.8* 8.6* 7.5*  HCT 30.1* 26.1* 23.7*  MCV 88.0 88.5 90.1  PLT 366 332 305   Cardiac Enzymes:  Recent Labs Lab 12/17/16 2055 12/18/16 0034 12/18/16 0619  TROPONINI <0.03 <0.03 <0.03   BNP: BNP (last 3 results)  Recent Labs  12/18/16 0034  BNP 56.0    ProBNP (last 3 results) No results for input(s): PROBNP in the last 8760 hours.  CBG:  Recent Labs Lab 12/18/16 1110 12/18/16 1611 12/18/16 2009 12/19/16 0717 12/19/16 1126  GLUCAP 107* 111* 121* 119* 112*       Signed:  HERNANDEZ ACOSTA,ESTELA  [facility] Pager: [phone] 12/19/2016, 11:35 AM

## 2016-12-19 NOTE — Discharge Instructions (Signed)
In addition to included general instructions for venous insufficiency/stasis wounds and dehydration,  Diet: Resume home heart healthy diet.   Activity: Keep lower extremities elevated whenever not walking. Light activity and walking are encouraged. Do not drive or drink alcohol if taking narcotic pain medications.  Wound care: Keep lower extremity wraps clean and dry (no showers or submerging wound wraps under water).  Medications: Resume all home medications. For mild to moderate pain: acetaminophen (Tylenol). Take 2 tablets of prescribed narcotic pain medication only prior to wound care. Do not combine Tylenol and Percocet within a 6 hour period as Percocet contains Tylenol.  Call office [phone]) at any time if any questions, worsening pain, fevers/chills, bleeding, excessive wound drainage, or other concerns.

## 2016-12-20 DIAGNOSIS — L03115 Cellulitis of right lower limb: Secondary | ICD-10-CM | POA: Diagnosis not present

## 2016-12-20 DIAGNOSIS — I872 Venous insufficiency (chronic) (peripheral): Secondary | ICD-10-CM | POA: Diagnosis not present

## 2016-12-20 DIAGNOSIS — I10 Essential (primary) hypertension: Secondary | ICD-10-CM | POA: Diagnosis not present

## 2016-12-20 DIAGNOSIS — E876 Hypokalemia: Secondary | ICD-10-CM | POA: Diagnosis not present

## 2016-12-20 DIAGNOSIS — E785 Hyperlipidemia, unspecified: Secondary | ICD-10-CM | POA: Diagnosis not present

## 2016-12-20 DIAGNOSIS — L97912 Non-pressure chronic ulcer of unspecified part of right lower leg with fat layer exposed: Secondary | ICD-10-CM | POA: Diagnosis not present

## 2016-12-23 LAB — CULTURE, BLOOD (ROUTINE X 2)
Culture: NO GROWTH
Culture: NO GROWTH

## 2016-12-25 DIAGNOSIS — L97912 Non-pressure chronic ulcer of unspecified part of right lower leg with fat layer exposed: Secondary | ICD-10-CM | POA: Diagnosis not present

## 2016-12-25 DIAGNOSIS — I872 Venous insufficiency (chronic) (peripheral): Secondary | ICD-10-CM | POA: Diagnosis not present

## 2016-12-26 DIAGNOSIS — L97912 Non-pressure chronic ulcer of unspecified part of right lower leg with fat layer exposed: Secondary | ICD-10-CM | POA: Diagnosis not present

## 2016-12-26 DIAGNOSIS — I872 Venous insufficiency (chronic) (peripheral): Secondary | ICD-10-CM | POA: Diagnosis not present

## 2016-12-27 DIAGNOSIS — I87313 Chronic venous hypertension (idiopathic) with ulcer of bilateral lower extremity: Secondary | ICD-10-CM | POA: Diagnosis not present

## 2016-12-28 DIAGNOSIS — L97912 Non-pressure chronic ulcer of unspecified part of right lower leg with fat layer exposed: Secondary | ICD-10-CM | POA: Diagnosis not present

## 2016-12-28 DIAGNOSIS — I872 Venous insufficiency (chronic) (peripheral): Secondary | ICD-10-CM | POA: Diagnosis not present

## 2016-12-31 DIAGNOSIS — L97912 Non-pressure chronic ulcer of unspecified part of right lower leg with fat layer exposed: Secondary | ICD-10-CM | POA: Diagnosis not present

## 2016-12-31 DIAGNOSIS — I872 Venous insufficiency (chronic) (peripheral): Secondary | ICD-10-CM | POA: Diagnosis not present

## 2017-01-01 DIAGNOSIS — L97912 Non-pressure chronic ulcer of unspecified part of right lower leg with fat layer exposed: Secondary | ICD-10-CM | POA: Diagnosis not present

## 2017-01-01 DIAGNOSIS — I872 Venous insufficiency (chronic) (peripheral): Secondary | ICD-10-CM | POA: Diagnosis not present

## 2017-01-04 DIAGNOSIS — L97912 Non-pressure chronic ulcer of unspecified part of right lower leg with fat layer exposed: Secondary | ICD-10-CM | POA: Diagnosis not present

## 2017-01-04 DIAGNOSIS — I872 Venous insufficiency (chronic) (peripheral): Secondary | ICD-10-CM | POA: Diagnosis not present

## 2017-01-08 DIAGNOSIS — L97912 Non-pressure chronic ulcer of unspecified part of right lower leg with fat layer exposed: Secondary | ICD-10-CM | POA: Diagnosis not present

## 2017-01-08 DIAGNOSIS — I872 Venous insufficiency (chronic) (peripheral): Secondary | ICD-10-CM | POA: Diagnosis not present

## 2017-01-11 DIAGNOSIS — I872 Venous insufficiency (chronic) (peripheral): Secondary | ICD-10-CM | POA: Diagnosis not present

## 2017-01-11 DIAGNOSIS — L97912 Non-pressure chronic ulcer of unspecified part of right lower leg with fat layer exposed: Secondary | ICD-10-CM | POA: Diagnosis not present

## 2017-01-12 DIAGNOSIS — I872 Venous insufficiency (chronic) (peripheral): Secondary | ICD-10-CM | POA: Diagnosis not present

## 2017-01-12 DIAGNOSIS — L97912 Non-pressure chronic ulcer of unspecified part of right lower leg with fat layer exposed: Secondary | ICD-10-CM | POA: Diagnosis not present

## 2017-01-15 DIAGNOSIS — L97912 Non-pressure chronic ulcer of unspecified part of right lower leg with fat layer exposed: Secondary | ICD-10-CM | POA: Diagnosis not present

## 2017-01-15 DIAGNOSIS — I872 Venous insufficiency (chronic) (peripheral): Secondary | ICD-10-CM | POA: Diagnosis not present

## 2017-01-17 DIAGNOSIS — I87313 Chronic venous hypertension (idiopathic) with ulcer of bilateral lower extremity: Secondary | ICD-10-CM | POA: Diagnosis not present

## 2017-01-18 DIAGNOSIS — I872 Venous insufficiency (chronic) (peripheral): Secondary | ICD-10-CM | POA: Diagnosis not present

## 2017-01-18 DIAGNOSIS — L97912 Non-pressure chronic ulcer of unspecified part of right lower leg with fat layer exposed: Secondary | ICD-10-CM | POA: Diagnosis not present

## 2017-01-22 DIAGNOSIS — L97912 Non-pressure chronic ulcer of unspecified part of right lower leg with fat layer exposed: Secondary | ICD-10-CM | POA: Diagnosis not present

## 2017-01-22 DIAGNOSIS — I872 Venous insufficiency (chronic) (peripheral): Secondary | ICD-10-CM | POA: Diagnosis not present

## 2017-01-23 DIAGNOSIS — I872 Venous insufficiency (chronic) (peripheral): Secondary | ICD-10-CM | POA: Diagnosis not present

## 2017-01-23 DIAGNOSIS — L97912 Non-pressure chronic ulcer of unspecified part of right lower leg with fat layer exposed: Secondary | ICD-10-CM | POA: Diagnosis not present

## 2017-01-25 DIAGNOSIS — L03115 Cellulitis of right lower limb: Secondary | ICD-10-CM | POA: Diagnosis not present

## 2017-01-25 DIAGNOSIS — L97904 Non-pressure chronic ulcer of unspecified part of unspecified lower leg with necrosis of bone: Secondary | ICD-10-CM | POA: Diagnosis not present

## 2017-01-28 DIAGNOSIS — L03115 Cellulitis of right lower limb: Secondary | ICD-10-CM | POA: Diagnosis not present

## 2017-01-28 DIAGNOSIS — L97904 Non-pressure chronic ulcer of unspecified part of unspecified lower leg with necrosis of bone: Secondary | ICD-10-CM | POA: Diagnosis not present

## 2017-01-28 DIAGNOSIS — M1990 Unspecified osteoarthritis, unspecified site: Secondary | ICD-10-CM | POA: Diagnosis not present

## 2017-01-28 DIAGNOSIS — I10 Essential (primary) hypertension: Secondary | ICD-10-CM | POA: Diagnosis not present

## 2017-01-31 DIAGNOSIS — L97912 Non-pressure chronic ulcer of unspecified part of right lower leg with fat layer exposed: Secondary | ICD-10-CM | POA: Diagnosis not present

## 2017-01-31 DIAGNOSIS — I872 Venous insufficiency (chronic) (peripheral): Secondary | ICD-10-CM | POA: Diagnosis not present

## 2017-02-01 DIAGNOSIS — L97912 Non-pressure chronic ulcer of unspecified part of right lower leg with fat layer exposed: Secondary | ICD-10-CM | POA: Diagnosis not present

## 2017-02-01 DIAGNOSIS — I872 Venous insufficiency (chronic) (peripheral): Secondary | ICD-10-CM | POA: Diagnosis not present

## 2017-02-07 DIAGNOSIS — I872 Venous insufficiency (chronic) (peripheral): Secondary | ICD-10-CM | POA: Diagnosis not present

## 2017-02-07 DIAGNOSIS — L97912 Non-pressure chronic ulcer of unspecified part of right lower leg with fat layer exposed: Secondary | ICD-10-CM | POA: Diagnosis not present

## 2017-02-12 ENCOUNTER — Ambulatory Visit (HOSPITAL_COMMUNITY): Payer: Medicare Other | Admitting: Physical Therapy

## 2017-02-12 ENCOUNTER — Telehealth (HOSPITAL_COMMUNITY): Payer: Self-pay | Admitting: Family Medicine

## 2017-02-12 NOTE — Telephone Encounter (Signed)
02/12/17 pt cx because she has diarrhea and a sore throat.  She said that she would call back by the end of the week to reschedule.

## 2017-02-15 DIAGNOSIS — I872 Venous insufficiency (chronic) (peripheral): Secondary | ICD-10-CM | POA: Diagnosis not present

## 2017-02-15 DIAGNOSIS — L97912 Non-pressure chronic ulcer of unspecified part of right lower leg with fat layer exposed: Secondary | ICD-10-CM | POA: Diagnosis not present

## 2017-02-18 DIAGNOSIS — I872 Venous insufficiency (chronic) (peripheral): Secondary | ICD-10-CM | POA: Diagnosis not present

## 2017-02-18 DIAGNOSIS — L97912 Non-pressure chronic ulcer of unspecified part of right lower leg with fat layer exposed: Secondary | ICD-10-CM | POA: Diagnosis not present

## 2017-02-21 DIAGNOSIS — L97912 Non-pressure chronic ulcer of unspecified part of right lower leg with fat layer exposed: Secondary | ICD-10-CM | POA: Diagnosis not present

## 2017-02-21 DIAGNOSIS — I872 Venous insufficiency (chronic) (peripheral): Secondary | ICD-10-CM | POA: Diagnosis not present

## 2017-02-25 DIAGNOSIS — L97912 Non-pressure chronic ulcer of unspecified part of right lower leg with fat layer exposed: Secondary | ICD-10-CM | POA: Diagnosis not present

## 2017-02-25 DIAGNOSIS — I872 Venous insufficiency (chronic) (peripheral): Secondary | ICD-10-CM | POA: Diagnosis not present

## 2017-02-28 DIAGNOSIS — L97912 Non-pressure chronic ulcer of unspecified part of right lower leg with fat layer exposed: Secondary | ICD-10-CM | POA: Diagnosis not present

## 2017-02-28 DIAGNOSIS — I872 Venous insufficiency (chronic) (peripheral): Secondary | ICD-10-CM | POA: Diagnosis not present

## 2017-03-01 DIAGNOSIS — I872 Venous insufficiency (chronic) (peripheral): Secondary | ICD-10-CM | POA: Diagnosis not present

## 2017-03-01 DIAGNOSIS — L97912 Non-pressure chronic ulcer of unspecified part of right lower leg with fat layer exposed: Secondary | ICD-10-CM | POA: Diagnosis not present

## 2017-03-04 DIAGNOSIS — L97912 Non-pressure chronic ulcer of unspecified part of right lower leg with fat layer exposed: Secondary | ICD-10-CM | POA: Diagnosis not present

## 2017-03-04 DIAGNOSIS — I872 Venous insufficiency (chronic) (peripheral): Secondary | ICD-10-CM | POA: Diagnosis not present

## 2017-03-07 ENCOUNTER — Ambulatory Visit (HOSPITAL_COMMUNITY): Payer: Medicare Other | Attending: Internal Medicine | Admitting: Physical Therapy

## 2017-03-08 DIAGNOSIS — L97912 Non-pressure chronic ulcer of unspecified part of right lower leg with fat layer exposed: Secondary | ICD-10-CM | POA: Diagnosis not present

## 2017-03-08 DIAGNOSIS — I872 Venous insufficiency (chronic) (peripheral): Secondary | ICD-10-CM | POA: Diagnosis not present

## 2017-03-12 DIAGNOSIS — L97912 Non-pressure chronic ulcer of unspecified part of right lower leg with fat layer exposed: Secondary | ICD-10-CM | POA: Diagnosis not present

## 2017-03-12 DIAGNOSIS — I872 Venous insufficiency (chronic) (peripheral): Secondary | ICD-10-CM | POA: Diagnosis not present

## 2017-03-14 DIAGNOSIS — I872 Venous insufficiency (chronic) (peripheral): Secondary | ICD-10-CM | POA: Diagnosis not present

## 2017-03-15 DIAGNOSIS — L97912 Non-pressure chronic ulcer of unspecified part of right lower leg with fat layer exposed: Secondary | ICD-10-CM | POA: Diagnosis not present

## 2017-03-15 DIAGNOSIS — I872 Venous insufficiency (chronic) (peripheral): Secondary | ICD-10-CM | POA: Diagnosis not present

## 2017-03-19 DIAGNOSIS — L97912 Non-pressure chronic ulcer of unspecified part of right lower leg with fat layer exposed: Secondary | ICD-10-CM | POA: Diagnosis not present

## 2017-03-19 DIAGNOSIS — I872 Venous insufficiency (chronic) (peripheral): Secondary | ICD-10-CM | POA: Diagnosis not present

## 2017-03-21 DIAGNOSIS — L97912 Non-pressure chronic ulcer of unspecified part of right lower leg with fat layer exposed: Secondary | ICD-10-CM | POA: Diagnosis not present

## 2017-03-21 DIAGNOSIS — I872 Venous insufficiency (chronic) (peripheral): Secondary | ICD-10-CM | POA: Diagnosis not present

## 2017-03-26 DIAGNOSIS — L97912 Non-pressure chronic ulcer of unspecified part of right lower leg with fat layer exposed: Secondary | ICD-10-CM | POA: Diagnosis not present

## 2017-03-26 DIAGNOSIS — I872 Venous insufficiency (chronic) (peripheral): Secondary | ICD-10-CM | POA: Diagnosis not present

## 2017-03-29 DIAGNOSIS — L97912 Non-pressure chronic ulcer of unspecified part of right lower leg with fat layer exposed: Secondary | ICD-10-CM | POA: Diagnosis not present

## 2017-03-29 DIAGNOSIS — I872 Venous insufficiency (chronic) (peripheral): Secondary | ICD-10-CM | POA: Diagnosis not present

## 2017-04-01 ENCOUNTER — Encounter (HOSPITAL_COMMUNITY): Payer: Self-pay | Admitting: *Deleted

## 2017-04-01 ENCOUNTER — Emergency Department (HOSPITAL_COMMUNITY): Payer: Medicare Other

## 2017-04-01 ENCOUNTER — Inpatient Hospital Stay (HOSPITAL_COMMUNITY)
Admission: EM | Admit: 2017-04-01 | Discharge: 2017-04-06 | DRG: 603 | Disposition: A | Discharge status: Skilled Nursing Facility | Payer: Medicare Other | Attending: Internal Medicine | Admitting: Internal Medicine

## 2017-04-01 DIAGNOSIS — M898X9 Other specified disorders of bone, unspecified site: Secondary | ICD-10-CM | POA: Diagnosis present

## 2017-04-01 DIAGNOSIS — I83009 Varicose veins of unspecified lower extremity with ulcer of unspecified site: Secondary | ICD-10-CM | POA: Diagnosis present

## 2017-04-01 DIAGNOSIS — N839 Noninflammatory disorder of ovary, fallopian tube and broad ligament, unspecified: Secondary | ICD-10-CM | POA: Diagnosis present

## 2017-04-01 DIAGNOSIS — Z79899 Other long term (current) drug therapy: Secondary | ICD-10-CM

## 2017-04-01 DIAGNOSIS — L03116 Cellulitis of left lower limb: Secondary | ICD-10-CM | POA: Diagnosis present

## 2017-04-01 DIAGNOSIS — N179 Acute kidney failure, unspecified: Secondary | ICD-10-CM | POA: Diagnosis present

## 2017-04-01 DIAGNOSIS — I5022 Chronic systolic (congestive) heart failure: Secondary | ICD-10-CM | POA: Diagnosis not present

## 2017-04-01 DIAGNOSIS — N184 Chronic kidney disease, stage 4 (severe): Secondary | ICD-10-CM | POA: Diagnosis present

## 2017-04-01 DIAGNOSIS — R1900 Intra-abdominal and pelvic swelling, mass and lump, unspecified site: Secondary | ICD-10-CM | POA: Diagnosis not present

## 2017-04-01 DIAGNOSIS — L97929 Non-pressure chronic ulcer of unspecified part of left lower leg with unspecified severity: Secondary | ICD-10-CM | POA: Diagnosis not present

## 2017-04-01 DIAGNOSIS — E869 Volume depletion, unspecified: Secondary | ICD-10-CM | POA: Diagnosis present

## 2017-04-01 DIAGNOSIS — Z7982 Long term (current) use of aspirin: Secondary | ICD-10-CM

## 2017-04-01 DIAGNOSIS — E861 Hypovolemia: Secondary | ICD-10-CM | POA: Diagnosis present

## 2017-04-01 DIAGNOSIS — L97909 Non-pressure chronic ulcer of unspecified part of unspecified lower leg with unspecified severity: Secondary | ICD-10-CM

## 2017-04-01 DIAGNOSIS — D869 Sarcoidosis, unspecified: Secondary | ICD-10-CM | POA: Diagnosis present

## 2017-04-01 DIAGNOSIS — E876 Hypokalemia: Secondary | ICD-10-CM | POA: Diagnosis present

## 2017-04-01 DIAGNOSIS — I959 Hypotension, unspecified: Secondary | ICD-10-CM | POA: Diagnosis present

## 2017-04-01 DIAGNOSIS — R404 Transient alteration of awareness: Secondary | ICD-10-CM | POA: Diagnosis not present

## 2017-04-01 DIAGNOSIS — R531 Weakness: Secondary | ICD-10-CM | POA: Diagnosis not present

## 2017-04-01 DIAGNOSIS — L03115 Cellulitis of right lower limb: Secondary | ICD-10-CM | POA: Diagnosis not present

## 2017-04-01 DIAGNOSIS — I130 Hypertensive heart and chronic kidney disease with heart failure and stage 1 through stage 4 chronic kidney disease, or unspecified chronic kidney disease: Secondary | ICD-10-CM | POA: Diagnosis present

## 2017-04-01 DIAGNOSIS — L97919 Non-pressure chronic ulcer of unspecified part of right lower leg with unspecified severity: Secondary | ICD-10-CM | POA: Diagnosis present

## 2017-04-01 DIAGNOSIS — I890 Lymphedema, not elsewhere classified: Secondary | ICD-10-CM | POA: Diagnosis present

## 2017-04-01 DIAGNOSIS — T148XXA Other injury of unspecified body region, initial encounter: Secondary | ICD-10-CM

## 2017-04-01 DIAGNOSIS — E1122 Type 2 diabetes mellitus with diabetic chronic kidney disease: Secondary | ICD-10-CM | POA: Diagnosis present

## 2017-04-01 DIAGNOSIS — L089 Local infection of the skin and subcutaneous tissue, unspecified: Secondary | ICD-10-CM

## 2017-04-01 DIAGNOSIS — Z8249 Family history of ischemic heart disease and other diseases of the circulatory system: Secondary | ICD-10-CM

## 2017-04-01 DIAGNOSIS — D638 Anemia in other chronic diseases classified elsewhere: Secondary | ICD-10-CM | POA: Diagnosis present

## 2017-04-01 DIAGNOSIS — Z823 Family history of stroke: Secondary | ICD-10-CM

## 2017-04-01 DIAGNOSIS — R1084 Generalized abdominal pain: Secondary | ICD-10-CM | POA: Diagnosis not present

## 2017-04-01 DIAGNOSIS — E785 Hyperlipidemia, unspecified: Secondary | ICD-10-CM | POA: Diagnosis present

## 2017-04-01 DIAGNOSIS — I878 Other specified disorders of veins: Secondary | ICD-10-CM | POA: Diagnosis present

## 2017-04-01 DIAGNOSIS — L0390 Cellulitis, unspecified: Secondary | ICD-10-CM | POA: Diagnosis present

## 2017-04-01 DIAGNOSIS — Z6841 Body Mass Index (BMI) 40.0 and over, adult: Secondary | ICD-10-CM

## 2017-04-01 DIAGNOSIS — I872 Venous insufficiency (chronic) (peripheral): Secondary | ICD-10-CM | POA: Diagnosis present

## 2017-04-01 DIAGNOSIS — J45909 Unspecified asthma, uncomplicated: Secondary | ICD-10-CM | POA: Diagnosis present

## 2017-04-01 DIAGNOSIS — Z833 Family history of diabetes mellitus: Secondary | ICD-10-CM

## 2017-04-01 LAB — COMPREHENSIVE METABOLIC PANEL
ALT: 30 U/L (ref 14–54)
AST: 109 U/L — ABNORMAL HIGH (ref 15–41)
Albumin: 2.8 g/dL — ABNORMAL LOW (ref 3.5–5.0)
Alkaline Phosphatase: 43 U/L (ref 38–126)
Anion gap: 12 (ref 5–15)
BUN: 27 mg/dL — ABNORMAL HIGH (ref 6–20)
CO2: 19 mmol/L — ABNORMAL LOW (ref 22–32)
Calcium: 9.2 mg/dL (ref 8.9–10.3)
Chloride: 105 mmol/L (ref 101–111)
Creatinine, Ser: 2.25 mg/dL — ABNORMAL HIGH (ref 0.44–1.00)
GFR calc Af Amer: 27 mL/min — ABNORMAL LOW (ref >60)
GFR calc non Af Amer: 23 mL/min — ABNORMAL LOW (ref >60)
Glucose, Bld: 117 mg/dL — ABNORMAL HIGH (ref 65–99)
Potassium: 2.6 mmol/L — CL (ref 3.5–5.1)
Sodium: 136 mmol/L (ref 135–145)
Total Bilirubin: 0.9 mg/dL (ref 0.3–1.2)
Total Protein: 7.9 g/dL (ref 6.5–8.1)

## 2017-04-01 LAB — CBC WITH DIFFERENTIAL/PLATELET
Basophils Absolute: 0 10*3/uL (ref 0.0–0.1)
Basophils Relative: 0 %
Eosinophils Absolute: 0 10*3/uL (ref 0.0–0.7)
Eosinophils Relative: 0 %
HCT: 29.7 % — ABNORMAL LOW (ref 36.0–46.0)
Hemoglobin: 9.7 g/dL — ABNORMAL LOW (ref 12.0–15.0)
Lymphocytes Relative: 8 %
Lymphs Abs: 1.1 10*3/uL (ref 0.7–4.0)
MCH: 28.5 pg (ref 26.0–34.0)
MCHC: 32.7 g/dL (ref 30.0–36.0)
MCV: 87.4 fL (ref 78.0–100.0)
Monocytes Absolute: 1.2 10*3/uL — ABNORMAL HIGH (ref 0.1–1.0)
Monocytes Relative: 9 %
Neutro Abs: 10.4 10*3/uL — ABNORMAL HIGH (ref 1.7–7.7)
Neutrophils Relative %: 83 %
Platelets: 313 10*3/uL (ref 150–400)
RBC: 3.4 MIL/uL — ABNORMAL LOW (ref 3.87–5.11)
RDW: 16.5 % — ABNORMAL HIGH (ref 11.5–15.5)
WBC: 12.7 10*3/uL — ABNORMAL HIGH (ref 4.0–10.5)

## 2017-04-01 LAB — LACTIC ACID, PLASMA: Lactic Acid, Venous: 1.8 mmol/L (ref 0.5–1.9)

## 2017-04-01 MED ORDER — VANCOMYCIN HCL IN DEXTROSE 1-5 GM/200ML-% IV SOLN
1000.0000 mg | Freq: Once | INTRAVENOUS | Status: AC
  Administered 2017-04-01: 1000 mg via INTRAVENOUS
  Filled 2017-04-01: qty 200

## 2017-04-01 MED ORDER — PIPERACILLIN-TAZOBACTAM 3.375 G IVPB
3.3750 g | Freq: Three times a day (TID) | INTRAVENOUS | Status: DC
  Administered 2017-04-02 – 2017-04-06 (×13): 3.375 g via INTRAVENOUS
  Filled 2017-04-01 (×13): qty 50

## 2017-04-01 MED ORDER — POTASSIUM CHLORIDE CRYS ER 20 MEQ PO TBCR
40.0000 meq | EXTENDED_RELEASE_TABLET | Freq: Once | ORAL | Status: AC
  Administered 2017-04-02: 40 meq via ORAL
  Filled 2017-04-01: qty 2

## 2017-04-01 MED ORDER — PIPERACILLIN-TAZOBACTAM 3.375 G IVPB 30 MIN
3.3750 g | Freq: Once | INTRAVENOUS | Status: AC
  Administered 2017-04-01: 3.375 g via INTRAVENOUS
  Filled 2017-04-01: qty 50

## 2017-04-01 MED ORDER — VANCOMYCIN HCL IN DEXTROSE 1-5 GM/200ML-% IV SOLN
1000.0000 mg | Freq: Two times a day (BID) | INTRAVENOUS | Status: DC
  Administered 2017-04-01 – 2017-04-03 (×5): 1000 mg via INTRAVENOUS
  Filled 2017-04-01 (×4): qty 200

## 2017-04-01 MED ORDER — SODIUM CHLORIDE 0.9 % IV SOLN: 1500.0000 mg | INTRAVENOUS | Status: DC

## 2017-04-01 MED ORDER — SODIUM CHLORIDE 0.9 % IV BOLUS (SEPSIS)
1000.0000 mL | Freq: Once | INTRAVENOUS | Status: AC
  Administered 2017-04-01: 1000 mL via INTRAVENOUS

## 2017-04-01 MED ORDER — POTASSIUM CHLORIDE IN NACL 20-0.45 MEQ/L-% IV SOLN
INTRAVENOUS | Status: DC
  Filled 2017-04-01 (×2): qty 1000

## 2017-04-01 NOTE — ED Provider Notes (Signed)
[facility] DEPT Provider Note   CSN: [id number] Arrival date & time: 04/01/17  2027  By signing my name below, I, [name], attest that this documentation has been prepared under the direction and in the presence of Dorie Rank, MD. Electronically Signed: [name], ED Scribe. 04/01/17. 9:05 PM.    History   Chief Complaint Chief Complaint  Patient presents with  . Fall    HPI Kari [name] is a 56 y.o. female with a PMHx of CHF, HTN and asthma who presents to the Emergency Department complaining of gradually worsening weakness that started yesterday. Associated sx include feeling disoriented and dehydrated. Pt denies fever, vomiting, nausea and CP.Patient has a history of chronic sarcoidosis, congestive heart failure lymphedema. She has chronic skin wounds that require dressing changes. She has noticed increasing drainage from the wounds.  The history is provided by the patient. No language interpreter was used.    Past Medical History:  Diagnosis Date  . Asthma   . CHF (congestive heart failure) ([place]) 03/12/2014  . Depression   . Headache(784.0)   . Hyperlipidemia   . Hypertension   . Lung nodules   . Renal disorder   . Sarcoidosis ([place])   . Ulcer ([place])    left posterior calf    Patient Active Problem List   Diagnosis Date Noted  . AKI (acute kidney injury) ([place]) 12/17/2016  . Cellulitis and abscess of right lower extremity 12/17/2016  . Syncope and collapse 12/17/2016  . Peripheral edema 12/17/2016  . Hypokalemia 12/17/2016  . Anemia 12/17/2016  . Acute diastolic CHF (congestive heart failure) ([place]) 03/14/2014  . CHF (congestive heart failure) ([place]) 03/12/2014  . Sarcoidosis ([place]) 03/12/2014  . Severe sepsis ([place]) 10/17/2013  . ARF (acute renal failure) ([place]) 10/08/2013  . Cellulitis and abscess 10/08/2013  . Obesity 10/08/2013  . Volume depletion 10/08/2013  . Unspecified venous (peripheral) insufficiency 10/28/2012  . Mediastinal  lymphadenopathy 10/16/2012  . Pulmonary nodules 10/16/2012  . 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 Chest pain 09/02/2012  . Asthma   . Hypertension   . Depression   . Venous stasis ulcers ([place]) 07/29/2012  . 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 Venous ulcer of leg ([place]) 07/08/2012    Past Surgical History:  Procedure Laterality Date  . cataract surgery Left 07-2013  . NO PAST SURGERIES      OB History    No data available       Home Medications    Prior to Admission medications   Medication Sig Start Date End Date Taking? Authorizing Provider  albuterol (PROVENTIL HFA;VENTOLIN HFA) 108 (90 BASE) MCG/ACT inhaler Inhale 2 puffs into the lungs every 6 (six) hours as needed for shortness of breath.   Yes Historical Provider, MD  aspirin EC 81 MG tablet Take 81 mg by mouth daily.   Yes Historical Provider, MD  furosemide (LASIX) 40 MG tablet Take 40 mg by mouth daily. 02/20/17  Yes Historical Provider, MD  potassium chloride SA (K-DUR,KLOR-CON) 20 MEQ tablet Take 1 tablet (20 mEq total) by mouth daily. 01/11/16  Yes Milton Ferguson, MD  SANTYL ointment Apply 1 application topically daily.  03/27/17  Yes Historical Provider, MD  traMADol (ULTRAM) 50 MG tablet Take 50 mg by mouth every 6 (six) hours as needed for moderate pain or severe pain.   Yes Historical Provider, MD    Family History Family History  Problem Relation Age of Onset  .  Heart failure Mother   . Diabetes Mellitus II Mother   . Hypertension Mother   . Cancer Mother     unknown type  . Heart disease Father   . Stroke Father   . Diabetes Mellitus II Father     Social History Social History  Substance Use Topics  . Smoking status: Never Smoker  . Smokeless tobacco: Never Used  . Alcohol use No     Allergies   Patient has no known allergies.   Review of Systems Review of Systems  Constitutional: Negative for fever.    Cardiovascular: Negative for chest pain.  Gastrointestinal: Negative for nausea and vomiting.  Neurological: Positive for weakness.  All other systems reviewed and are negative.    Physical Exam Updated Vital Signs BP (!) 119/56 (BP Location: Left Arm)   Pulse 94   Temp 97.9 F (36.6 C) (Oral)   Resp 16   Ht 5\' 7"  (1.702 m)   Wt (!) 154.2 kg   LMP 11/21/2011   SpO2 100%   BMI 53.25 kg/m   Physical Exam  Constitutional: No distress.  HENT:  Head: Normocephalic and atraumatic.  Right Ear: External ear normal.  Left Ear: External ear normal.  Eyes: Conjunctivae are normal. Right eye exhibits no discharge. Left eye exhibits no discharge. No scleral icterus.  Neck: Neck supple. No tracheal deviation present.  Cardiovascular: Normal rate, regular rhythm and intact distal pulses.   Pulmonary/Chest: Effort normal and breath sounds normal. No stridor. No respiratory distress. She has no wheezes. She has no rales.  Abdominal: Soft. Bowel sounds are normal. She exhibits no distension. There is no tenderness. There is no rebound and no guarding.  Musculoskeletal: She exhibits edema and tenderness.  Large amount of edema to bilateral LE.  Erythema and tenderness to thighs and calves.  Mucopururlent discharge to the skin folds in the thigh and inguinale region.  Necrotic type wound right lower extremity with purulent malodorous drainage  Neurological: She is alert. She has normal strength. No cranial nerve deficit (no facial droop, extraocular movements intact, no slurred speech) or sensory deficit. She exhibits normal muscle tone. She displays no seizure activity. Coordination normal.  Skin: Skin is warm and dry. No rash noted.  Psychiatric: She has a normal mood and affect.  Nursing note and vitals reviewed.    ED Treatments / Results  DIAGNOSTIC STUDIES: Oxygen Saturation is 100% on RA, normal by my interpretation.   COORDINATION OF CARE: 8:59 PM-Discussed next steps with pt.  Pt verbalized understanding and is agreeable with the plan.    Labs (all labs ordered are listed, but only abnormal results are displayed) Labs Reviewed  COMPREHENSIVE METABOLIC PANEL - Abnormal; Notable for the following:       Result Value   Potassium 2.6 (*)    CO2 19 (*)    Glucose, Bld 117 (*)    BUN 27 (*)    Creatinine, Ser 2.25 (*)    Albumin 2.8 (*)    AST 109 (*)    GFR calc non Af Amer 23 (*)    GFR calc Af Amer 27 (*)    All other components within normal limits  CBC WITH DIFFERENTIAL/PLATELET - Abnormal; Notable for the following:    WBC 12.7 (*)    RBC 3.40 (*)    Hemoglobin 9.7 (*)    HCT 29.7 (*)    RDW 16.5 (*)    Neutro Abs 10.4 (*)    Monocytes Absolute 1.2 (*)  All other components within normal limits  CULTURE, BLOOD (ROUTINE X 2)  CULTURE, BLOOD (ROUTINE X 2)  URINE CULTURE  LACTIC ACID, PLASMA  URINALYSIS, ROUTINE W REFLEX MICROSCOPIC  LACTIC ACID, PLASMA  CA 125    EKG  EKG Interpretation  Date/Time:  Monday April 01 2017 21:31:02 EDT Ventricular Rate:  91 PR Interval:    QRS Duration: 109 QT Interval:  347 QTC Calculation: 427 R Axis:   57 Text Interpretation:  Sinus rhythm Paired ventricular premature complexes , new since last tracing Inferior infarct, age indeterminate Confirmed by Rashada Klontz  MD-J, Ravin Bendall [phone]) on 04/01/2017 9:45:19 PM       Radiology Ct Abdomen Pelvis Wo Contrast  Result Date: 04/01/2017 CLINICAL DATA:  Acute onset of worsening generalized weakness. Disorientation and dehydration. Increasing drainage from chronic skin wounds, with generalized abdominal pain and distention. Initial encounter. EXAM: CT ABDOMEN AND PELVIS WITHOUT CONTRAST TECHNIQUE: Multidetector CT imaging of the abdomen and pelvis was performed following the standard protocol without IV contrast. COMPARISON:  Renal ultrasound performed 12/18/2016 FINDINGS: Lower chest: Mild bibasilar atelectasis noted. The heart is borderline normal in size. Hepatobiliary:  The liver is unremarkable in appearance. The gallbladder is difficult to fully assess due to motion artifact, but appears grossly unremarkable. The common bile duct remains normal in caliber. Pancreas: The pancreas is within normal limits. Spleen: The spleen is unremarkable in appearance. Adrenals/Urinary Tract: The adrenal glands are unremarkable in appearance. The kidneys are within normal limits. There is no evidence of hydronephrosis. No renal or ureteral stones are identified. No perinephric stranding is seen. Stomach/Bowel: The stomach is unremarkable in appearance. The small bowel is within normal limits. The appendix is not well characterized but appears grossly normal in caliber, without evidence of appendicitis. The colon is unremarkable in appearance. Vascular/Lymphatic: The abdominal aorta is unremarkable in appearance. The inferior vena cava is grossly unremarkable. No retroperitoneal lymphadenopathy is seen. No pelvic sidewall lymphadenopathy is identified. Reproductive: The bladder is moderately distended and grossly unremarkable. The uterus is grossly unremarkable. Nabothian cysts are noted at the cervix. The right ovary is grossly unremarkable. There is a very large 31.2 x 26.1 x 19.8 cm cystic mass filling much of the abdomen. Given its appearance, this more likely arises from the left ovary. Other: Mild skin thickening is noted along the patient's pannus. Known chronic skin wounds are difficult fully characterize. Musculoskeletal: No acute osseous abnormalities are identified. The visualized musculature is unremarkable in appearance. IMPRESSION: 1. Very large 31.2 x 26.1 x 19.8 cm cystic mass filling much of the abdomen. Given its appearance, this more likely arises from the left ovary. This explains the patient's worsening abdominal distention. Surgical consultation is recommended for removal of the cystic mass. 2. Mild bibasilar atelectasis noted. 3. Mild skin thickening along the patient's  pannus. Electronically Signed   By: Garald Balding M.D.   On: 04/01/2017 23:12   Dg Chest Port 1 View  Result Date: 04/01/2017 CLINICAL DATA:  Weakness that began yesterday. Cellulitis. Sarcoidosis. Morbid obesity. EXAM: PORTABLE CHEST 1 VIEW COMPARISON:  Most recent chest radiograph 12/18/2016. Most recent CT chest 10/08/2013. FINDINGS: Cardiomegaly. Normal mediastinal contours. No active infiltrates or failure. Unusual air shadow under the RIGHT hemidiaphragm could simply represent colon interposition although on prior chest CT, and on prior chest radiographs, the colon is not interposed above the liver. In the appropriate setting, the findings could represent pneumoperitoneum. Air-filled splenic flexure appears unremarkable, under the LEFT hemidiaphragm. No osseous findings. IMPRESSION: Cannot confidently exclude free  air under the RIGHT hemidiaphragm, although colonic interposition above the liver could certainly have this appearance. Recommend noncontrast CT scan of the abdomen pelvis for further evaluation. Findings discussed with ordering provider. Cardiomegaly without active infiltrates or failure. Electronically Signed   By: Staci Righter M.D.   On: 04/01/2017 21:45    Procedures Procedures (including critical care time)  Medications Ordered in ED Medications  vancomycin (VANCOCIN) IVPB 1000 mg/200 mL premix (1,000 mg Intravenous New Bag/Given 04/01/17 2209)  piperacillin-tazobactam (ZOSYN) IVPB 3.375 g (not administered)  vancomycin (VANCOCIN) 1,500 mg in sodium chloride 0.9 % 500 mL IVPB (not administered)  potassium chloride SA (K-DUR,KLOR-CON) CR tablet 40 mEq (not administered)  0.45 % NaCl with KCl 20 mEq / L infusion (not administered)  sodium chloride 0.9 % bolus 1,000 mL (1,000 mLs Intravenous New Bag/Given 04/01/17 2159)  piperacillin-tazobactam (ZOSYN) IVPB 3.375 g (3.375 g Intravenous New Bag/Given 04/01/17 2201)  vancomycin (VANCOCIN) IVPB 1000 mg/200 mL premix (1,000 mg Intravenous  New Bag/Given 04/01/17 2203)     Initial Impression / Assessment and Plan / ED Course  I have reviewed the triage vital signs and the nursing notes.  Pertinent labs & imaging results that were available during my care of the patient were reviewed by me and considered in my medical decision making (see chart for details).  Clinical Course as of Apr 01 2340  Mon Apr 01, 2017  2337 Discussed CT finding with Dr Glo Herring.  He will consult on patient in the ED  [JK]    Clinical Course User Index [JK] Dorie Rank, MD    Patient presents to the emergency room with complaints of increasing weakness and increasing drainage from chronic leg wounds. Patient has home care with the nurse stressing her legs twice a week.  The patient has copious drainage of the wounds on exam. I suspect the patient is a component of cellulitis. I started her on antibiotics. She is not febrile or tachycardic. No evidence of sepsis.  Patient's chest x-ray showed an incidental finding suggesting possible free air in her abdomen. CT scan was ordered to evaluate that further. On that CT scan a new finding of a very large abdominal mass is noted.  The suspicious for an ovarian lesion. I spoke with Dr. Glo Herring. He recommends adding on a CA-125. He will see the patient in the hospital.  Consult with medical service for admission.  She will need to be medically stabilized before she is a candidate for any surgical intervention.  Final Clinical Impressions(s) / ED Diagnoses   Final diagnoses:  Wound infection  Cellulitis of right lower extremity  Hypokalemia  Abdominal mass, unspecified abdominal location    I personally performed the services described in this documentation, which was scribed in my presence.  The recorded information has been reviewed and is accurate.     Dorie Rank, MD 04/01/17 [phone]

## 2017-04-01 NOTE — ED Notes (Addendum)
k+ 2.6, Dr Hillard Danker and primary nurse notified with no new orders at this time

## 2017-04-01 NOTE — ED Triage Notes (Signed)
[facility] called out for fall; pt states she became unable to walk yesterday and started to experience hallucinations and memory loss for a while; pt has weeping blisters to bilateral legs and has a home health nurse that comes out to bandage legs; pt denies any pain and states she has fallen multiple times due to her legs weeping

## 2017-04-01 NOTE — ED Notes (Signed)
2 failed attempts at IV start

## 2017-04-01 NOTE — Progress Notes (Signed)
Pharmacy Antibiotic Note  Kari [name] is a 56 y.o. female admitted on 04/01/2017 with cellulitis.  Pharmacy has been consulted for vancomycin and zosyn dosing.Vanc 1 gm and zosyn 3.375 gm ordered in the ED  Plan: Vanc 1gm IV for total 2 gm load then 1500 mg IV q24 hours Zosyn 3.375 gm IV q8 hours f/u renal funstion, cultures and clinical course  Height: 5\' 7"  (170.2 cm) Weight: (!) 340 lb (154.2 kg) IBW/kg (Calculated) : 61.6  Temp (24hrs), Avg:97.9 F (36.6 C), Min:97.9 F (36.6 C), Max:97.9 F (36.6 C)  No results for input(s): WBC, CREATININE, LATICACIDVEN, VANCOTROUGH, VANCOPEAK, VANCORANDOM, GENTTROUGH, GENTPEAK, GENTRANDOM, TOBRATROUGH, TOBRAPEAK, TOBRARND, AMIKACINPEAK, AMIKACINTROU, AMIKACIN in the last 168 hours.  CrCl cannot be calculated (Patient's most recent lab result is older than the maximum 21 days allowed.).    No Known Allergies  Antimicrobials this admission: vanc 4/9 >>  zosyn 4/9 >>    Thank you for allowing pharmacy to be a part of this patient's care.  Beverlee Nims 04/01/2017 9:19 PM

## 2017-04-02 DIAGNOSIS — I890 Lymphedema, not elsewhere classified: Secondary | ICD-10-CM | POA: Diagnosis present

## 2017-04-02 DIAGNOSIS — E869 Volume depletion, unspecified: Secondary | ICD-10-CM | POA: Diagnosis not present

## 2017-04-02 DIAGNOSIS — R1900 Intra-abdominal and pelvic swelling, mass and lump, unspecified site: Secondary | ICD-10-CM

## 2017-04-02 DIAGNOSIS — L0390 Cellulitis, unspecified: Secondary | ICD-10-CM | POA: Diagnosis not present

## 2017-04-02 DIAGNOSIS — N179 Acute kidney failure, unspecified: Secondary | ICD-10-CM

## 2017-04-02 DIAGNOSIS — F329 Major depressive disorder, single episode, unspecified: Secondary | ICD-10-CM | POA: Diagnosis not present

## 2017-04-02 DIAGNOSIS — L97919 Non-pressure chronic ulcer of unspecified part of right lower leg with unspecified severity: Secondary | ICD-10-CM | POA: Diagnosis present

## 2017-04-02 DIAGNOSIS — L03115 Cellulitis of right lower limb: Principal | ICD-10-CM

## 2017-04-02 DIAGNOSIS — E785 Hyperlipidemia, unspecified: Secondary | ICD-10-CM | POA: Diagnosis present

## 2017-04-02 DIAGNOSIS — R55 Syncope and collapse: Secondary | ICD-10-CM | POA: Diagnosis not present

## 2017-04-02 DIAGNOSIS — I872 Venous insufficiency (chronic) (peripheral): Secondary | ICD-10-CM | POA: Diagnosis present

## 2017-04-02 DIAGNOSIS — L97202 Non-pressure chronic ulcer of unspecified calf with fat layer exposed: Secondary | ICD-10-CM

## 2017-04-02 DIAGNOSIS — M898X9 Other specified disorders of bone, unspecified site: Secondary | ICD-10-CM | POA: Diagnosis present

## 2017-04-02 DIAGNOSIS — L97201 Non-pressure chronic ulcer of unspecified calf limited to breakdown of skin: Secondary | ICD-10-CM | POA: Diagnosis not present

## 2017-04-02 DIAGNOSIS — R262 Difficulty in walking, not elsewhere classified: Secondary | ICD-10-CM | POA: Diagnosis not present

## 2017-04-02 DIAGNOSIS — L03116 Cellulitis of left lower limb: Secondary | ICD-10-CM | POA: Diagnosis present

## 2017-04-02 DIAGNOSIS — L03119 Cellulitis of unspecified part of limb: Secondary | ICD-10-CM | POA: Diagnosis not present

## 2017-04-02 DIAGNOSIS — I5022 Chronic systolic (congestive) heart failure: Secondary | ICD-10-CM | POA: Diagnosis present

## 2017-04-02 DIAGNOSIS — E876 Hypokalemia: Secondary | ICD-10-CM | POA: Diagnosis not present

## 2017-04-02 DIAGNOSIS — D638 Anemia in other chronic diseases classified elsewhere: Secondary | ICD-10-CM | POA: Diagnosis present

## 2017-04-02 DIAGNOSIS — N83202 Unspecified ovarian cyst, left side: Secondary | ICD-10-CM | POA: Diagnosis not present

## 2017-04-02 DIAGNOSIS — E669 Obesity, unspecified: Secondary | ICD-10-CM | POA: Diagnosis not present

## 2017-04-02 DIAGNOSIS — D869 Sarcoidosis, unspecified: Secondary | ICD-10-CM | POA: Diagnosis present

## 2017-04-02 DIAGNOSIS — I83001 Varicose veins of unspecified lower extremity with ulcer of thigh: Secondary | ICD-10-CM | POA: Diagnosis not present

## 2017-04-02 DIAGNOSIS — D649 Anemia, unspecified: Secondary | ICD-10-CM | POA: Diagnosis not present

## 2017-04-02 DIAGNOSIS — N184 Chronic kidney disease, stage 4 (severe): Secondary | ICD-10-CM | POA: Diagnosis present

## 2017-04-02 DIAGNOSIS — J45909 Unspecified asthma, uncomplicated: Secondary | ICD-10-CM | POA: Diagnosis present

## 2017-04-02 DIAGNOSIS — R1909 Other intra-abdominal and pelvic swelling, mass and lump: Secondary | ICD-10-CM | POA: Diagnosis not present

## 2017-04-02 DIAGNOSIS — I83002 Varicose veins of unspecified lower extremity with ulcer of calf: Secondary | ICD-10-CM | POA: Diagnosis not present

## 2017-04-02 DIAGNOSIS — E861 Hypovolemia: Secondary | ICD-10-CM | POA: Diagnosis present

## 2017-04-02 DIAGNOSIS — R600 Localized edema: Secondary | ICD-10-CM | POA: Diagnosis not present

## 2017-04-02 DIAGNOSIS — I878 Other specified disorders of veins: Secondary | ICD-10-CM | POA: Diagnosis present

## 2017-04-02 DIAGNOSIS — I959 Hypotension, unspecified: Secondary | ICD-10-CM | POA: Diagnosis present

## 2017-04-02 DIAGNOSIS — N839 Noninflammatory disorder of ovary, fallopian tube and broad ligament, unspecified: Secondary | ICD-10-CM | POA: Diagnosis present

## 2017-04-02 DIAGNOSIS — I70238 Atherosclerosis of native arteries of right leg with ulceration of other part of lower right leg: Secondary | ICD-10-CM | POA: Diagnosis not present

## 2017-04-02 DIAGNOSIS — I10 Essential (primary) hypertension: Secondary | ICD-10-CM | POA: Diagnosis not present

## 2017-04-02 DIAGNOSIS — I83009 Varicose veins of unspecified lower extremity with ulcer of unspecified site: Secondary | ICD-10-CM | POA: Diagnosis not present

## 2017-04-02 DIAGNOSIS — I87313 Chronic venous hypertension (idiopathic) with ulcer of bilateral lower extremity: Secondary | ICD-10-CM | POA: Diagnosis not present

## 2017-04-02 DIAGNOSIS — A419 Sepsis, unspecified organism: Secondary | ICD-10-CM | POA: Diagnosis not present

## 2017-04-02 DIAGNOSIS — I5032 Chronic diastolic (congestive) heart failure: Secondary | ICD-10-CM | POA: Diagnosis not present

## 2017-04-02 DIAGNOSIS — I130 Hypertensive heart and chronic kidney disease with heart failure and stage 1 through stage 4 chronic kidney disease, or unspecified chronic kidney disease: Secondary | ICD-10-CM | POA: Diagnosis present

## 2017-04-02 DIAGNOSIS — E1122 Type 2 diabetes mellitus with diabetic chronic kidney disease: Secondary | ICD-10-CM | POA: Diagnosis present

## 2017-04-02 DIAGNOSIS — L97909 Non-pressure chronic ulcer of unspecified part of unspecified lower leg with unspecified severity: Secondary | ICD-10-CM | POA: Diagnosis not present

## 2017-04-02 DIAGNOSIS — Z6841 Body Mass Index (BMI) 40.0 and over, adult: Secondary | ICD-10-CM | POA: Diagnosis not present

## 2017-04-02 DIAGNOSIS — M6281 Muscle weakness (generalized): Secondary | ICD-10-CM | POA: Diagnosis not present

## 2017-04-02 DIAGNOSIS — L97929 Non-pressure chronic ulcer of unspecified part of left lower leg with unspecified severity: Secondary | ICD-10-CM | POA: Diagnosis present

## 2017-04-02 DIAGNOSIS — I70248 Atherosclerosis of native arteries of left leg with ulceration of other part of lower left leg: Secondary | ICD-10-CM | POA: Diagnosis not present

## 2017-04-02 LAB — URINALYSIS, ROUTINE W REFLEX MICROSCOPIC
Bilirubin Urine: NEGATIVE
Glucose, UA: NEGATIVE mg/dL
Ketones, ur: NEGATIVE mg/dL
Nitrite: NEGATIVE
Protein, ur: 30 mg/dL — AB
Specific Gravity, Urine: 1.018 (ref 1.005–1.030)
pH: 5 (ref 5.0–8.0)

## 2017-04-02 LAB — COMPREHENSIVE METABOLIC PANEL
ALT: 25 U/L (ref 14–54)
AST: 89 U/L — ABNORMAL HIGH (ref 15–41)
Albumin: 2.2 g/dL — ABNORMAL LOW (ref 3.5–5.0)
Alkaline Phosphatase: 34 U/L — ABNORMAL LOW (ref 38–126)
Anion gap: 7 (ref 5–15)
BUN: 25 mg/dL — ABNORMAL HIGH (ref 6–20)
CO2: 21 mmol/L — ABNORMAL LOW (ref 22–32)
Calcium: 8.3 mg/dL — ABNORMAL LOW (ref 8.9–10.3)
Chloride: 109 mmol/L (ref 101–111)
Creatinine, Ser: 1.96 mg/dL — ABNORMAL HIGH (ref 0.44–1.00)
GFR calc Af Amer: 32 mL/min — ABNORMAL LOW (ref >60)
GFR calc non Af Amer: 28 mL/min — ABNORMAL LOW (ref >60)
Glucose, Bld: 113 mg/dL — ABNORMAL HIGH (ref 65–99)
Potassium: 2.7 mmol/L — CL (ref 3.5–5.1)
Sodium: 137 mmol/L (ref 135–145)
Total Bilirubin: 0.8 mg/dL (ref 0.3–1.2)
Total Protein: 6.6 g/dL (ref 6.5–8.1)

## 2017-04-02 LAB — CBC
HCT: 26.5 % — ABNORMAL LOW (ref 36.0–46.0)
Hemoglobin: 8.7 g/dL — ABNORMAL LOW (ref 12.0–15.0)
MCH: 28.5 pg (ref 26.0–34.0)
MCHC: 32.8 g/dL (ref 30.0–36.0)
MCV: 86.9 fL (ref 78.0–100.0)
Platelets: 262 10*3/uL (ref 150–400)
RBC: 3.05 MIL/uL — ABNORMAL LOW (ref 3.87–5.11)
RDW: 16.6 % — ABNORMAL HIGH (ref 11.5–15.5)
WBC: 10.2 10*3/uL (ref 4.0–10.5)

## 2017-04-02 LAB — PREPARE RBC (CROSSMATCH)

## 2017-04-02 LAB — MAGNESIUM: Magnesium: 2.2 mg/dL (ref 1.7–2.4)

## 2017-04-02 LAB — SURGICAL PCR SCREEN
MRSA, PCR: NEGATIVE
Staphylococcus aureus: NEGATIVE

## 2017-04-02 LAB — ABO/RH: ABO/RH(D): O POS

## 2017-04-02 LAB — LACTIC ACID, PLASMA: Lactic Acid, Venous: 1.2 mmol/L (ref 0.5–1.9)

## 2017-04-02 MED ORDER — POTASSIUM CHLORIDE 10 MEQ/100ML IV SOLN
10.0000 meq | INTRAVENOUS | Status: AC
  Administered 2017-04-02 (×4): 10 meq via INTRAVENOUS
  Filled 2017-04-02 (×5): qty 100

## 2017-04-02 MED ORDER — ONDANSETRON HCL 4 MG PO TABS: 4.0000 mg | ORAL_TABLET | Freq: Four times a day (QID) | ORAL | Status: DC | PRN

## 2017-04-02 MED ORDER — DAKINS (1/4 STRENGTH) 0.125 % EX SOLN
Freq: Two times a day (BID) | CUTANEOUS | Status: DC
  Administered 2017-04-02: 23:00:00
  Administered 2017-04-02: 1
  Administered 2017-04-03 – 2017-04-04 (×3)
  Administered 2017-04-04: 1
  Filled 2017-04-02 (×2): qty 473

## 2017-04-02 MED ORDER — POTASSIUM CHLORIDE 10 MEQ/100ML IV SOLN
10.0000 meq | INTRAVENOUS | Status: AC
  Administered 2017-04-02 (×4): 10 meq via INTRAVENOUS
  Filled 2017-04-02 (×3): qty 100

## 2017-04-02 MED ORDER — PRO-STAT SUGAR FREE PO LIQD
60.0000 mL | Freq: Two times a day (BID) | ORAL | Status: DC
  Administered 2017-04-02 – 2017-04-03 (×2): 60 mL via ORAL
  Filled 2017-04-02 (×4): qty 60

## 2017-04-02 MED ORDER — ACETAMINOPHEN 650 MG RE SUPP: 650.0000 mg | Freq: Four times a day (QID) | RECTAL | Status: DC | PRN

## 2017-04-02 MED ORDER — POTASSIUM CHLORIDE 10 MEQ/100ML IV SOLN
10.0000 meq | INTRAVENOUS | Status: AC
  Administered 2017-04-02 (×3): 10 meq via INTRAVENOUS
  Filled 2017-04-02 (×3): qty 100

## 2017-04-02 MED ORDER — HYDROCODONE-ACETAMINOPHEN 5-325 MG PO TABS
1.0000 | ORAL_TABLET | Freq: Four times a day (QID) | ORAL | Status: DC | PRN
  Administered 2017-04-02: 1 via ORAL
  Administered 2017-04-03 – 2017-04-05 (×6): 2 via ORAL
  Filled 2017-04-02 (×5): qty 2
  Filled 2017-04-02: qty 1
  Filled 2017-04-02: qty 2

## 2017-04-02 MED ORDER — ASPIRIN EC 81 MG PO TBEC
81.0000 mg | DELAYED_RELEASE_TABLET | Freq: Every day | ORAL | Status: DC
  Administered 2017-04-02 – 2017-04-06 (×5): 81 mg via ORAL
  Filled 2017-04-02 (×5): qty 1

## 2017-04-02 MED ORDER — DIPHENHYDRAMINE HCL 25 MG PO CAPS
25.0000 mg | ORAL_CAPSULE | Freq: Once | ORAL | Status: AC
  Administered 2017-04-02: 25 mg via ORAL
  Filled 2017-04-02: qty 1

## 2017-04-02 MED ORDER — SODIUM CHLORIDE 0.9 % IV SOLN
INTRAVENOUS | Status: DC
  Administered 2017-04-02 – 2017-04-03 (×2): via INTRAVENOUS

## 2017-04-02 MED ORDER — ONDANSETRON HCL 4 MG/2ML IJ SOLN: 4.0000 mg | Freq: Four times a day (QID) | INTRAMUSCULAR | Status: DC | PRN

## 2017-04-02 MED ORDER — ENOXAPARIN SODIUM 40 MG/0.4ML ~~LOC~~ SOLN
40.0000 mg | SUBCUTANEOUS | Status: DC
  Administered 2017-04-02: 40 mg via SUBCUTANEOUS
  Filled 2017-04-02: qty 0.4

## 2017-04-02 MED ORDER — FUROSEMIDE 10 MG/ML IJ SOLN: 20.0000 mg | Freq: Once | INTRAMUSCULAR | Status: DC

## 2017-04-02 MED ORDER — ENOXAPARIN SODIUM 60 MG/0.6ML ~~LOC~~ SOLN: 60.0000 mg | SUBCUTANEOUS | Status: DC

## 2017-04-02 MED ORDER — SODIUM CHLORIDE 0.9 % IV SOLN
Freq: Once | INTRAVENOUS | Status: AC
  Administered 2017-04-02: 19:00:00 via INTRAVENOUS

## 2017-04-02 MED ORDER — ACETAMINOPHEN 325 MG PO TABS: 650.0000 mg | ORAL_TABLET | Freq: Four times a day (QID) | ORAL | Status: DC | PRN

## 2017-04-02 MED ORDER — ADULT MULTIVITAMIN W/MINERALS CH
1.0000 | ORAL_TABLET | Freq: Every day | ORAL | Status: DC
  Administered 2017-04-02 – 2017-04-06 (×5): 1 via ORAL
  Filled 2017-04-02 (×5): qty 1

## 2017-04-02 MED ORDER — ACETAMINOPHEN 325 MG PO TABS
650.0000 mg | ORAL_TABLET | Freq: Once | ORAL | Status: AC
  Administered 2017-04-02: 650 mg via ORAL
  Filled 2017-04-02: qty 2

## 2017-04-02 NOTE — Progress Notes (Signed)
Subjective: Patient was admitted due to cellulitis and stasis ulcerations of the lower extremities. She is started on combination IV antibiotics.  Objective: Vital signs in last 24 hours: Temp:  [97.9 F (36.6 C)-98.8 F (37.1 C)] 98.8 F (37.1 C) (04/10 0443) Pulse Rate:  [71-94] 71 (04/10 0443) Resp:  [16-24] 20 (04/10 0443) BP: (102-119)/(41-64) 104/64 (04/10 0443) SpO2:  [99 %-100 %] 100 % (04/10 0443) Weight:  [130.7 kg (288 lb 1.6 oz)-154.2 kg (340 lb)] 130.7 kg (288 lb 1.6 oz) (04/10 0206) Weight change:  Last BM Date: 04/01/17  Intake/Output from previous day: 04/09 0701 - 04/10 0700 In: 991.3 [P.O.:240; I.V.:201.3; IV Piggyback:550] Out: -   PHYSICAL EXAM General appearance: fatigued and no distress Resp: clear to auscultation bilaterally Cardio: S1, S2 normal GI: soft, non-tender; bowel sounds normal; no masses,  no organomegaly Extremities: venous stasis dermatitis noted and bilateral lower extremities ulcerations with bad odor  Lab Results:  Results for orders placed or performed during the hospital encounter of 04/01/17 (from the past 48 hour(s))  Comprehensive metabolic panel     Status: Abnormal   Collection Time: 04/01/17  9:45 PM  Result Value Ref Range   Sodium 136 135 - 145 mmol/L   Potassium 2.6 (LL) 3.5 - 5.1 mmol/L    Comment: CRITICAL RESULT CALLED TO, READ BACK BY AND VERIFIED WITH: HYAIT,C AT 2254 ON 4.9.18 BY ISLEY,B    Chloride 105 101 - 111 mmol/L   CO2 19 (L) 22 - 32 mmol/L   Glucose, Bld 117 (H) 65 - 99 mg/dL   BUN 27 (H) 6 - 20 mg/dL   Creatinine, Ser 2.25 (H) 0.44 - 1.00 mg/dL   Calcium 9.2 8.9 - 10.3 mg/dL   Total Protein 7.9 6.5 - 8.1 g/dL   Albumin 2.8 (L) 3.5 - 5.0 g/dL   AST 109 (H) 15 - 41 U/L   ALT 30 14 - 54 U/L   Alkaline Phosphatase 43 38 - 126 U/L   Total Bilirubin 0.9 0.3 - 1.2 mg/dL   GFR calc non Af Amer 23 (L) >60 mL/min   GFR calc Af Amer 27 (L) >60 mL/min    Comment: (NOTE) The eGFR has been calculated using the  CKD EPI equation. This calculation has not been validated in all clinical situations. eGFR's persistently <60 mL/min signify possible Chronic Kidney Disease.    Anion gap 12 5 - 15  CBC WITH DIFFERENTIAL     Status: Abnormal   Collection Time: 04/01/17  9:45 PM  Result Value Ref Range   WBC 12.7 (H) 4.0 - 10.5 K/uL   RBC 3.40 (L) 3.87 - 5.11 MIL/uL   Hemoglobin 9.7 (L) 12.0 - 15.0 g/dL   HCT 29.7 (L) 36.0 - 46.0 %   MCV 87.4 78.0 - 100.0 fL   MCH 28.5 26.0 - 34.0 pg   MCHC 32.7 30.0 - 36.0 g/dL   RDW 16.5 (H) 11.5 - 15.5 %   Platelets 313 150 - 400 K/uL   Neutrophils Relative % 83 %   Neutro Abs 10.4 (H) 1.7 - 7.7 K/uL   Lymphocytes Relative 8 %   Lymphs Abs 1.1 0.7 - 4.0 K/uL   Monocytes Relative 9 %   Monocytes Absolute 1.2 (H) 0.1 - 1.0 K/uL   Eosinophils Relative 0 %   Eosinophils Absolute 0.0 0.0 - 0.7 K/uL   Basophils Relative 0 %   Basophils Absolute 0.0 0.0 - 0.1 K/uL  Lactic acid, plasma     Status:  None   Collection Time: 04/01/17  9:45 PM  Result Value Ref Range   Lactic Acid, Venous 1.8 0.5 - 1.9 mmol/L  Blood Culture (routine x 2)     Status: None (Preliminary result)   Collection Time: 04/01/17  9:47 PM  Result Value Ref Range   Specimen Description LEFT ANTECUBITAL    Special Requests      BOTTLES DRAWN AEROBIC ONLY Blood Culture results may not be optimal due to an inadequate volume of blood received in culture bottles   Culture PENDING    Report Status PENDING   Blood Culture (routine x 2)     Status: None (Preliminary result)   Collection Time: 04/01/17  9:52 PM  Result Value Ref Range   Specimen Description BLOOD LEFT WRIST    Special Requests      BOTTLES DRAWN AEROBIC ONLY Blood Culture results may not be optimal due to an inadequate volume of blood received in culture bottles   Culture PENDING    Report Status PENDING   Lactic acid, plasma     Status: None   Collection Time: 04/02/17 12:35 AM  Result Value Ref Range   Lactic Acid, Venous 1.2  0.5 - 1.9 mmol/L  CBC     Status: Abnormal   Collection Time: 04/02/17  6:04 AM  Result Value Ref Range   WBC 10.2 4.0 - 10.5 K/uL   RBC 3.05 (L) 3.87 - 5.11 MIL/uL   Hemoglobin 8.7 (L) 12.0 - 15.0 g/dL   HCT 26.5 (L) 36.0 - 46.0 %   MCV 86.9 78.0 - 100.0 fL   MCH 28.5 26.0 - 34.0 pg   MCHC 32.8 30.0 - 36.0 g/dL   RDW 16.6 (H) 11.5 - 15.5 %   Platelets 262 150 - 400 K/uL  Comprehensive metabolic panel     Status: Abnormal   Collection Time: 04/02/17  6:04 AM  Result Value Ref Range   Sodium 137 135 - 145 mmol/L   Potassium 2.7 (LL) 3.5 - 5.1 mmol/L    Comment: CRITICAL RESULT CALLED TO, READ BACK BY AND VERIFIED WITH: AMBURN,A AT 6:55AM ON 04/02/17 BY FESTERMAN,C    Chloride 109 101 - 111 mmol/L   CO2 21 (L) 22 - 32 mmol/L   Glucose, Bld 113 (H) 65 - 99 mg/dL   BUN 25 (H) 6 - 20 mg/dL   Creatinine, Ser 1.96 (H) 0.44 - 1.00 mg/dL   Calcium 8.3 (L) 8.9 - 10.3 mg/dL   Total Protein 6.6 6.5 - 8.1 g/dL   Albumin 2.2 (L) 3.5 - 5.0 g/dL   AST 89 (H) 15 - 41 U/L   ALT 25 14 - 54 U/L   Alkaline Phosphatase 34 (L) 38 - 126 U/L   Total Bilirubin 0.8 0.3 - 1.2 mg/dL   GFR calc non Af Amer 28 (L) >60 mL/min   GFR calc Af Amer 32 (L) >60 mL/min    Comment: (NOTE) The eGFR has been calculated using the CKD EPI equation. This calculation has not been validated in all clinical situations. eGFR's persistently <60 mL/min signify possible Chronic Kidney Disease.    Anion gap 7 5 - 15  Magnesium     Status: None   Collection Time: 04/02/17  6:32 AM  Result Value Ref Range   Magnesium 2.2 1.7 - 2.4 mg/dL    ABGS No results for input(s): PHART, PO2ART, TCO2, HCO3 in the last 72 hours.  Invalid input(s): PCO2 CULTURES Recent Results (from the past 240 hour(s))  Blood Culture (routine x 2)     Status: None (Preliminary result)   Collection Time: 04/01/17  9:47 PM  Result Value Ref Range Status   Specimen Description LEFT ANTECUBITAL  Final   Special Requests   Final    BOTTLES DRAWN  AEROBIC ONLY Blood Culture results may not be optimal due to an inadequate volume of blood received in culture bottles   Culture PENDING  Incomplete   Report Status PENDING  Incomplete  Blood Culture (routine x 2)     Status: None (Preliminary result)   Collection Time: 04/01/17  9:52 PM  Result Value Ref Range Status   Specimen Description BLOOD LEFT WRIST  Final   Special Requests   Final    BOTTLES DRAWN AEROBIC ONLY Blood Culture results may not be optimal due to an inadequate volume of blood received in culture bottles   Culture PENDING  Incomplete   Report Status PENDING  Incomplete   Studies/Results: Ct Abdomen Pelvis Wo Contrast  Result Date: 04/01/2017 CLINICAL DATA:  Acute onset of worsening generalized weakness. Disorientation and dehydration. Increasing drainage from chronic skin wounds, with generalized abdominal pain and distention. Initial encounter. EXAM: CT ABDOMEN AND PELVIS WITHOUT CONTRAST TECHNIQUE: Multidetector CT imaging of the abdomen and pelvis was performed following the standard protocol without IV contrast. COMPARISON:  Renal ultrasound performed 12/18/2016 FINDINGS: Lower chest: Mild bibasilar atelectasis noted. The heart is borderline normal in size. Hepatobiliary: The liver is unremarkable in appearance. The gallbladder is difficult to fully assess due to motion artifact, but appears grossly unremarkable. The common bile duct remains normal in caliber. Pancreas: The pancreas is within normal limits. Spleen: The spleen is unremarkable in appearance. Adrenals/Urinary Tract: The adrenal glands are unremarkable in appearance. The kidneys are within normal limits. There is no evidence of hydronephrosis. No renal or ureteral stones are identified. No perinephric stranding is seen. Stomach/Bowel: The stomach is unremarkable in appearance. The small bowel is within normal limits. The appendix is not well characterized but appears grossly normal in caliber, without evidence of  appendicitis. The colon is unremarkable in appearance. Vascular/Lymphatic: The abdominal aorta is unremarkable in appearance. The inferior vena cava is grossly unremarkable. No retroperitoneal lymphadenopathy is seen. No pelvic sidewall lymphadenopathy is identified. Reproductive: The bladder is moderately distended and grossly unremarkable. The uterus is grossly unremarkable. Nabothian cysts are noted at the cervix. The right ovary is grossly unremarkable. There is a very large 31.2 x 26.1 x 19.8 cm cystic mass filling much of the abdomen. Given its appearance, this more likely arises from the left ovary. Other: Mild skin thickening is noted along the patient's pannus. Known chronic skin wounds are difficult fully characterize. Musculoskeletal: No acute osseous abnormalities are identified. The visualized musculature is unremarkable in appearance. IMPRESSION: 1. Very large 31.2 x 26.1 x 19.8 cm cystic mass filling much of the abdomen. Given its appearance, this more likely arises from the left ovary. This explains the patient's worsening abdominal distention. Surgical consultation is recommended for removal of the cystic mass. 2. Mild bibasilar atelectasis noted. 3. Mild skin thickening along the patient's pannus. Electronically Signed   By: Garald Balding M.D.   On: 04/01/2017 23:12   Dg Chest Port 1 View  Result Date: 04/01/2017 CLINICAL DATA:  Weakness that began yesterday. Cellulitis. Sarcoidosis. Morbid obesity. EXAM: PORTABLE CHEST 1 VIEW COMPARISON:  Most recent chest radiograph 12/18/2016. Most recent CT chest 10/08/2013. FINDINGS: Cardiomegaly. Normal mediastinal contours. No active infiltrates or failure. Unusual air shadow under the  RIGHT hemidiaphragm could simply represent colon interposition although on prior chest CT, and on prior chest radiographs, the colon is not interposed above the liver. In the appropriate setting, the findings could represent pneumoperitoneum. Air-filled splenic flexure  appears unremarkable, under the LEFT hemidiaphragm. No osseous findings. IMPRESSION: Cannot confidently exclude free air under the RIGHT hemidiaphragm, although colonic interposition above the liver could certainly have this appearance. Recommend noncontrast CT scan of the abdomen pelvis for further evaluation. Findings discussed with ordering provider. Cardiomegaly without active infiltrates or failure. Electronically Signed   By: Staci Righter M.D.   On: 04/01/2017 21:45    Medications: I have reviewed the patient's current medications.  Assesment:  Active Problems:   Venous stasis ulcers (HCC)   Volume depletion   AKI (acute kidney injury) ([place])   Hypokalemia   Cellulitis ovarian mass fall   Plan:  Medications reviewed Continue combination IV antibiotics Continue wound care Continue K+ supplement GYN consult     LOS: 0 days   Berdie Malter 04/02/2017, 8:06 AM

## 2017-04-02 NOTE — Care Management Obs Status (Signed)
[facility] NOTIFICATION   Patient Details  Name: Kari [name] MRN: [id number] Date of Birth: March 03, 1961   Medicare Observation Status Notification Given:  Yes    Sherald Barge, RN 04/02/2017, 3:27 PM

## 2017-04-02 NOTE — Consult Note (Signed)
[facility] Nurse wound consult note Reason for Consult: LE cellulitis  Wound type:  Full thickness ulcerations bilaterally related to lymphadema/venous stasis She is noted to have a fluid filled bulla on the plantar surface of the right foot and skin changes on this surface as well.  MASD (moisture associated skin damage) inner thighs IAD (intertriginous dermatitis); under her pannus Pressure Injury POA: No Measurement: Unable to obtain accurate measurements of the leg wounds, noted to be at least 20-23 cm x 25cm bilaterally Scattered partial thickness skin loss between her thighs, intense redness under the pannus Wound bed: Bilateral LE wound are necrotic100%; with foul thick purulent drainage. Under the pannus and inner thighs pink, skin intact mostly  Drainage (amount, consistency, odor) see above Periwound: venous stasis dermatitis and skin changes bilaterally. Dressing procedure/placement/frequency:  GYN to talk to patient while [place] nurse in the room, planned for removal of abdominal mass tomorrow. Discussed LE ulcerations with Dr. Legrand Rams, who requested I discuss patient with surgery. Contacted Dr. Arnoldo Morale to discuss my assessment today. He will plant to see patient today.  I have ordered 1/4% Dakins to assist with chemical debridement and odor.  I notified the bedside nurses of this but suggested that they hold off on the dressing until surgery evaluated this patient.  [facility] nurse will follow along, please note if need for re-consultation please notify as I will only follow remotely. [place], [place]

## 2017-04-02 NOTE — Care Management (Signed)
    Durable Medical Equipment        Start     Ordered   04/02/17 1543  For home use only DME 3 n 1  Once     04/02/17 1542   04/02/17 1543  For home use only DME standard manual wheelchair with seat cushion  Once    Comments:  Patient suffers from CHF which impairs their ability to perform daily activities like ambulating in the home.  A walker will not resolve  issue with performing activities of daily living. A wheelchair will allow patient to safely perform daily activities. Patient can safely propel the wheelchair in the home or has a caregiver who can provide assistance.  Accessories: elevating leg rests (ELRs), wheel locks, extensions and anti-tippers.   04/02/17 1543

## 2017-04-02 NOTE — H&P (Addendum)
TR[facility] H&P    Patient Demographics:    Kari [name], is a 56 y.o. female  MRN: [id number]  DOB - 1961/10/29  Admit Date - 04/01/2017  Referring MD/NP/PA: Dr. Dorie Rank  Outpatient Primary MD for the patient is Kari [name], confirmed with patient and her sister  Patient coming from: Home  Chief Complaint  Patient presents with  . Fall      HPI:    Kari [name]  is a 56 y.o. female, With history of CHF, hypertension, asthma who came to ED for worsening generalized weakness which has been going on for past few days. Also the patient was feeling disoriented and dehydrated. She denies fever, no chest pain or shortness of breath. No nausea vomiting or diarrhea. She has a history of chronic sarcoidosis, lymphedema of lower extremities. Patient has wound on the lower extremities and get wound care twice a week at home. She noticed increasing drainage from the wounds.  In the ED, patient was started on antibiotics vancomycin and Zosyn for cellulitis of lower extremities. As patient's chest x-ray showed incidental finding suggesting possibility of free air in the abdomen. CT scan of the abdomen was ordered which showed very large abdominal mass suspicious for ovarian origin. GYN Dr. Glo Herring was consulted by ED physician and GYN will see the patient in a.m.    Review of systems:    .  A full 10 point Review of Systems was done, except as stated above, all other Review of Systems were negative.   With Past History of the following :    Past Medical History:  Diagnosis Date  . Asthma   . CHF (congestive heart failure) ([place]) 03/12/2014  . Depression   . Headache(784.0)   . Hyperlipidemia   . Hypertension   . Lung nodules   . Renal disorder   . Sarcoidosis ([place])   . Ulcer ([place])    left posterior calf      Past Surgical History:  Procedure Laterality Date  . cataract surgery Left 07-2013  . NO PAST  SURGERIES        Social History:      Social History  Substance Use Topics  . Smoking status: Never Smoker  . Smokeless tobacco: Never Used  . Alcohol use No       Family History :     Family History  Problem Relation Age of Onset  . Heart failure Mother   . Diabetes Mellitus II Mother   . Hypertension Mother   . Cancer Mother     unknown type  . Heart disease Father   . Stroke Father   . Diabetes Mellitus II Father       Home Medications:   Prior to Admission medications   Medication Sig Start Date End Date Taking? Authorizing Provider  albuterol (PROVENTIL HFA;VENTOLIN HFA) 108 (90 BASE) MCG/ACT inhaler Inhale 2 puffs into the lungs every 6 (six) hours as needed for shortness of breath.   Yes Historical Provider, MD  aspirin EC 81 MG tablet Take 81 mg by mouth  daily.   Yes Historical Provider, MD  furosemide (LASIX) 40 MG tablet Take 40 mg by mouth daily. 02/20/17  Yes Historical Provider, MD  potassium chloride SA (K-DUR,KLOR-CON) 20 MEQ tablet Take 1 tablet (20 mEq total) by mouth daily. 01/11/16  Yes Milton Ferguson, MD  SANTYL ointment Apply 1 application topically daily.  03/27/17  Yes Historical Provider, MD  traMADol (ULTRAM) 50 MG tablet Take 50 mg by mouth every 6 (six) hours as needed for moderate pain or severe pain.   Yes Historical Provider, MD     Allergies:    No Known Allergies   Physical Exam:   Vitals  Blood pressure (!) 119/56, pulse 94, temperature 97.9 F (36.6 C), temperature source Oral, resp. rate 16, height 5\' 7"  (1.702 m), weight (!) 154.2 kg (340 lb), last menstrual period 11/21/2011, SpO2 100 %.  1.  General: Appears mildly lethargic  2. Psychiatric:  Intact judgement and  insight, awake alert, oriented x 3.  3. Neurologic: No focal neurological deficits, all cranial nerves intact.Strength 5/5 all 4 extremities, sensation intact all 4 extremities, plantars down going.  4. Eyes :  anicteric sclerae, moist conjunctivae with no lid  lag. PERRLA.  5. ENMT:  Oropharynx clear with moist mucous membranes and good dentition  6. Neck:  supple, no cervical lymphadenopathy appriciated, No thyromegaly  7. Respiratory : Normal respiratory effort, good air movement bilaterally,clear to  auscultation bilaterally  8. Cardiovascular : RRR, no gallops, rubs or murmurs, bilateral 2+ nonpitting edema  9. Gastrointestinal:  Positive bowel sounds, abdomen soft, non-tender to palpation,no hepatosplenomegaly, no rigidity or guarding       10. Skin:  Ulcers of lower extremities not examined, as dressing in place  11.Musculoskeletal:  Good muscle tone,  joints appear normal , no effusions,  normal range of motion    Data Review:    CBC  Recent Labs Lab 04/01/17 2145  WBC 12.7*  HGB 9.7*  HCT 29.7*  PLT 313  MCV 87.4  MCH 28.5  MCHC 32.7  RDW 16.5*  LYMPHSABS 1.1  MONOABS 1.2*  EOSABS 0.0  BASOSABS 0.0   ------------------------------------------------------------------------------------------------------------------  Chemistries   Recent Labs Lab 04/01/17 2145  NA 136  K 2.6*  CL 105  CO2 19*  GLUCOSE 117*  BUN 27*  CREATININE 2.25*  CALCIUM 9.2  AST 109*  ALT 30  ALKPHOS 43  BILITOT 0.9   ------------------------------------------------------------------------------------------------------------------  ------------------------------------------------------------------------------------------------------------------ GFR: Estimated Creatinine Clearance: 44 mL/min (A) (by C-G formula based on SCr of 2.25 mg/dL (H)). Liver Function Tests:  Recent Labs Lab 04/01/17 2145  AST 109*  ALT 30  ALKPHOS 43  BILITOT 0.9  PROT 7.9  ALBUMIN 2.8*       Imaging Results:    Ct Abdomen Pelvis Wo Contrast  Result Date: 04/01/2017 CLINICAL DATA:  Acute onset of worsening generalized weakness. Disorientation and dehydration. Increasing drainage from chronic skin wounds, with generalized abdominal pain  and distention. Initial encounter. EXAM: CT ABDOMEN AND PELVIS WITHOUT CONTRAST TECHNIQUE: Multidetector CT imaging of the abdomen and pelvis was performed following the standard protocol without IV contrast. COMPARISON:  Renal ultrasound performed 12/18/2016 FINDINGS:IMPRESSION: 1. Very large 31.2 x 26.1 x 19.8 cm cystic mass filling much of the abdomen. Given its appearance, this more likely arises from the left ovary. This explains the patient's worsening abdominal distention. Surgical consultation is recommended for removal of the cystic mass. 2. Mild bibasilar atelectasis noted. 3. Mild skin thickening along the patient's pannus. Electronically Signed  By: Garald Balding M.D.   On: 04/01/2017 23:12   Dg Chest Port 1 View  Result Date: 04/01/2017 CLINICAL DATA:  Weakness that began yesterday. Cellulitis. Sarcoidosis. Morbid obesity. EXAM: PORTABLE CHEST 1 VIEW COMPARISON:  Most recent chest radiograph 12/18/2016. Most recent CT chest 10/08/2013. FINDINGS: Cardiomegaly. Normal mediastinal contours. No active infiltrates or failure. Unusual air shadow under the RIGHT hemidiaphragm could simply represent colon interposition although on prior chest CT, and on prior chest radiographs, the colon is not interposed above the liver. In the appropriate setting, the findings could represent pneumoperitoneum. Air-filled splenic flexure appears unremarkable, under the LEFT hemidiaphragm. No osseous findings. IMPRESSION: Cannot confidently exclude free air under the RIGHT hemidiaphragm, although colonic interposition above the liver could certainly have this appearance. Recommend noncontrast CT scan of the abdomen pelvis for further evaluation. Findings discussed with ordering provider. Cardiomegaly without active infiltrates or failure. Electronically Signed   By: Staci Righter M.D.   On: 04/01/2017 21:45    My personal review of EKG: Rhythm NSR   Assessment & Plan:    Active Problems:   Venous stasis ulcers  (HCC)   Volume depletion   AKI (acute kidney injury) ([place])   Hypokalemia   Cellulitis   1. Cellulitis- patient has cellulitis and infected leg wounds of both lower extremities, will continue vancomycin and Zosyn per pharmacy consultation.Will consult wound care in a.m. 2. Leukocytosis- UA and urine culture has been ordered, will follow the results. Antibiotics have been ordered as above. 3. Ovarian mass- CT abdomen shows large ovarian mass, GYN was consulted by ED physician. Dr. Glo Herring to see the patient in a.m. CA-125 has been ordered. 4. Acute kidney injury-coated patient's creatinine is 2.25, previous creatinine on 12/19/2016 was 1.77. Will hold Lasix. Started on gentle IV hydration with normal saline. Follow BMP in a.m. 5. Hypokalemia-potassium is 2.6, will replace potassium and check BMP in a.m.    DVT Prophylaxis-   Lovenox   AM Labs Ordered, also please review Full Orders  Family Communication: Admission, patients condition and plan of care including tests being ordered have been discussed with the patient, her sister and her aunt at bedside who indicate understanding and agree with the plan and Code Status.  Code Status:  Full code  Admission status: Observation    Time spent in minutes : 60 minutes   Janifer Gieselman S M.D on 04/02/2017 at 12:32 AM  Between 7am to 7pm - Pager - [phone]. After 7pm go to [email or web address] - password [facility]  [facility] - Office  [phone]

## 2017-04-02 NOTE — Consult Note (Signed)
Reason for Consult: Stasis dermatitis, bilateral lower extremities Referring Physician: Dr. Randon Goldsmith Kari [name] is an 56 y.o. female.  HPI: Patient is a 56 year old black female multiple medical problems including CHF, sarcoidosis, renal insufficiency, hypertension, and history of chronic stasis disease of lower extremities. She has been receiving treatment in the past including Unna boots. She states they have healed in the past, but recently have over the past few months become worse. She was able to ambulate, though ambulation and decreased because of this. There is some pain with palpation of the wounds noted.  Past Medical History:  Diagnosis Date  . Asthma   . CHF (congestive heart failure) ([place]) 03/12/2014  . Depression   . Headache(784.0)   . Hyperlipidemia   . Hypertension   . Lung nodules   . Renal disorder   . Sarcoidosis ([place])   . Ulcer ([employer])    left posterior calf    Past Surgical History:  Procedure Laterality Date  . cataract surgery Left 07-2013  . NO PAST SURGERIES      Family History  Problem Relation Age of Onset  . Heart failure Mother   . Diabetes Mellitus II Mother   . Hypertension Mother   . Cancer Mother     unknown type  . Heart disease Father   . Stroke Father   . Diabetes Mellitus II Father     Social History:  reports that she has never smoked. She has never used smokeless tobacco. She reports that she does not drink alcohol or use drugs.  Allergies: No Known Allergies  Medications:  Prior to Admission:  Prescriptions Prior to Admission  Medication Sig Dispense Refill Last Dose  . albuterol (PROVENTIL HFA;VENTOLIN HFA) 108 (90 BASE) MCG/ACT inhaler Inhale 2 puffs into the lungs every 6 (six) hours as needed for shortness of breath.   unknown  . aspirin EC 81 MG tablet Take 81 mg by mouth daily.   04/01/2017 at Unknown time  . furosemide (LASIX) 40 MG tablet Take 40 mg by mouth daily.   04/01/2017 at Unknown time  . potassium chloride SA  (K-DUR,KLOR-CON) 20 MEQ tablet Take 1 tablet (20 mEq total) by mouth daily. 7 tablet 0 04/01/2017 at Unknown time  . SANTYL ointment Apply 1 application topically daily.    04/01/2017 at Unknown time  . traMADol (ULTRAM) 50 MG tablet Take 50 mg by mouth every 6 (six) hours as needed for moderate pain or severe pain.   04/01/2017 at Unknown time   Scheduled: . sodium chloride   Intravenous Once  . acetaminophen  650 mg Oral Once  . aspirin EC  81 mg Oral Daily  . diphenhydrAMINE  25 mg Oral Once  . feeding supplement (PRO-STAT SUGAR FREE 64)  60 mL Oral BID BM  . furosemide  20 mg Intravenous Once  . multivitamin with minerals  1 tablet Oral Daily  . piperacillin-tazobactam (ZOSYN)  IV  3.375 g Intravenous Q8H  . potassium chloride  10 mEq Intravenous Q1 Hr x 4  . sodium hypochlorite   Irrigation BID  . vancomycin  1,000 mg Intravenous Q12H    Results for orders placed or performed during the hospital encounter of 04/01/17 (from the past 48 hour(s))  Comprehensive metabolic panel     Status: Abnormal   Collection Time: 04/01/17  9:45 PM  Result Value Ref Range   Sodium 136 135 - 145 mmol/L   Potassium 2.6 (LL) 3.5 - 5.1 mmol/L    Comment:  CRITICAL RESULT CALLED TO, READ BACK BY AND VERIFIED WITH: HYAIT,C AT 2254 ON 4.9.18 BY ISLEY,B    Chloride 105 101 - 111 mmol/L   CO2 19 (L) 22 - 32 mmol/L   Glucose, Bld 117 (H) 65 - 99 mg/dL   BUN 27 (H) 6 - 20 mg/dL   Creatinine, Ser 2.25 (H) 0.44 - 1.00 mg/dL   Calcium 9.2 8.9 - 10.3 mg/dL   Total Protein 7.9 6.5 - 8.1 g/dL   Albumin 2.8 (L) 3.5 - 5.0 g/dL   AST 109 (H) 15 - 41 U/L   ALT 30 14 - 54 U/L   Alkaline Phosphatase 43 38 - 126 U/L   Total Bilirubin 0.9 0.3 - 1.2 mg/dL   GFR calc non Af Amer 23 (L) >60 mL/min   GFR calc Af Amer 27 (L) >60 mL/min    Comment: (NOTE) The eGFR has been calculated using the CKD EPI equation. This calculation has not been validated in all clinical situations. eGFR's persistently <60 mL/min signify  possible Chronic Kidney Disease.    Anion gap 12 5 - 15  CBC WITH DIFFERENTIAL     Status: Abnormal   Collection Time: 04/01/17  9:45 PM  Result Value Ref Range   WBC 12.7 (H) 4.0 - 10.5 K/uL   RBC 3.40 (L) 3.87 - 5.11 MIL/uL   Hemoglobin 9.7 (L) 12.0 - 15.0 g/dL   HCT 29.7 (L) 36.0 - 46.0 %   MCV 87.4 78.0 - 100.0 fL   MCH 28.5 26.0 - 34.0 pg   MCHC 32.7 30.0 - 36.0 g/dL   RDW 16.5 (H) 11.5 - 15.5 %   Platelets 313 150 - 400 K/uL   Neutrophils Relative % 83 %   Neutro Abs 10.4 (H) 1.7 - 7.7 K/uL   Lymphocytes Relative 8 %   Lymphs Abs 1.1 0.7 - 4.0 K/uL   Monocytes Relative 9 %   Monocytes Absolute 1.2 (H) 0.1 - 1.0 K/uL   Eosinophils Relative 0 %   Eosinophils Absolute 0.0 0.0 - 0.7 K/uL   Basophils Relative 0 %   Basophils Absolute 0.0 0.0 - 0.1 K/uL  Lactic acid, plasma     Status: None   Collection Time: 04/01/17  9:45 PM  Result Value Ref Range   Lactic Acid, Venous 1.8 0.5 - 1.9 mmol/L  Blood Culture (routine x 2)     Status: None (Preliminary result)   Collection Time: 04/01/17  9:47 PM  Result Value Ref Range   Specimen Description LEFT ANTECUBITAL    Special Requests      BOTTLES DRAWN AEROBIC ONLY Blood Culture results may not be optimal due to an inadequate volume of blood received in culture bottles   Culture NO GROWTH < 12 HOURS    Report Status PENDING   Blood Culture (routine x 2)     Status: None (Preliminary result)   Collection Time: 04/01/17  9:52 PM  Result Value Ref Range   Specimen Description BLOOD LEFT WRIST    Special Requests      BOTTLES DRAWN AEROBIC ONLY Blood Culture results may not be optimal due to an inadequate volume of blood received in culture bottles   Culture NO GROWTH < 12 HOURS    Report Status PENDING   Lactic acid, plasma     Status: None   Collection Time: 04/02/17 12:35 AM  Result Value Ref Range   Lactic Acid, Venous 1.2 0.5 - 1.9 mmol/L  CBC  Status: Abnormal   Collection Time: 04/02/17  6:04 AM  Result Value Ref  Range   WBC 10.2 4.0 - 10.5 K/uL   RBC 3.05 (L) 3.87 - 5.11 MIL/uL   Hemoglobin 8.7 (L) 12.0 - 15.0 g/dL   HCT 26.5 (L) 36.0 - 46.0 %   MCV 86.9 78.0 - 100.0 fL   MCH 28.5 26.0 - 34.0 pg   MCHC 32.8 30.0 - 36.0 g/dL   RDW 16.6 (H) 11.5 - 15.5 %   Platelets 262 150 - 400 K/uL  Comprehensive metabolic panel     Status: Abnormal   Collection Time: 04/02/17  6:04 AM  Result Value Ref Range   Sodium 137 135 - 145 mmol/L   Potassium 2.7 (LL) 3.5 - 5.1 mmol/L    Comment: CRITICAL RESULT CALLED TO, READ BACK BY AND VERIFIED WITH: AMBURN,A AT 6:55AM ON 04/02/17 BY FESTERMAN,C    Chloride 109 101 - 111 mmol/L   CO2 21 (L) 22 - 32 mmol/L   Glucose, Bld 113 (H) 65 - 99 mg/dL   BUN 25 (H) 6 - 20 mg/dL   Creatinine, Ser 1.96 (H) 0.44 - 1.00 mg/dL   Calcium 8.3 (L) 8.9 - 10.3 mg/dL   Total Protein 6.6 6.5 - 8.1 g/dL   Albumin 2.2 (L) 3.5 - 5.0 g/dL   AST 89 (H) 15 - 41 U/L   ALT 25 14 - 54 U/L   Alkaline Phosphatase 34 (L) 38 - 126 U/L   Total Bilirubin 0.8 0.3 - 1.2 mg/dL   GFR calc non Af Amer 28 (L) >60 mL/min   GFR calc Af Amer 32 (L) >60 mL/min    Comment: (NOTE) The eGFR has been calculated using the CKD EPI equation. This calculation has not been validated in all clinical situations. eGFR's persistently <60 mL/min signify possible Chronic Kidney Disease.    Anion gap 7 5 - 15  Magnesium     Status: None   Collection Time: 04/02/17  6:32 AM  Result Value Ref Range   Magnesium 2.2 1.7 - 2.4 mg/dL  Type and screen [facility]     Status: None (Preliminary result)   Collection Time: 04/02/17 11:23 AM  Result Value Ref Range   ABO/RH(D) O POS    Antibody Screen NEG    Sample Expiration 04/05/2017    Unit Number [id number]    Blood Component Type RED CELLS,LR    Unit division 00    Status of Unit ALLOCATED    Transfusion Status OK TO TRANSFUSE    Crossmatch Result Compatible    Unit Number [id number]    Blood Component Type RED CELLS,LR    Unit division 00     Status of Unit ALLOCATED    Transfusion Status OK TO TRANSFUSE    Crossmatch Result Compatible    Unit Number [id number]    Blood Component Type RBC LR PHER2    Unit division 00    Status of Unit ALLOCATED    Transfusion Status OK TO TRANSFUSE    Crossmatch Result Compatible   Prepare RBC     Status: None   Collection Time: 04/02/17 11:23 AM  Result Value Ref Range   Order Confirmation ORDER PROCESSED BY BLOOD BANK   ABO/Rh     Status: None   Collection Time: 04/02/17 11:23 AM  Result Value Ref Range   ABO/RH(D) O POS   Prepare RBC (crossmatch)     Status: None   Collection Time: 04/02/17 12:30 PM  Result Value Ref Range   Order Confirmation ORDER PROCESSED BY BLOOD BANK     Ct Abdomen Pelvis Wo Contrast  Result Date: 04/01/2017 CLINICAL DATA:  Acute onset of worsening generalized weakness. Disorientation and dehydration. Increasing drainage from chronic skin wounds, with generalized abdominal pain and distention. Initial encounter. EXAM: CT ABDOMEN AND PELVIS WITHOUT CONTRAST TECHNIQUE: Multidetector CT imaging of the abdomen and pelvis was performed following the standard protocol without IV contrast. COMPARISON:  Renal ultrasound performed 12/18/2016 FINDINGS: Lower chest: Mild bibasilar atelectasis noted. The heart is borderline normal in size. Hepatobiliary: The liver is unremarkable in appearance. The gallbladder is difficult to fully assess due to motion artifact, but appears grossly unremarkable. The common bile duct remains normal in caliber. Pancreas: The pancreas is within normal limits. Spleen: The spleen is unremarkable in appearance. Adrenals/Urinary Tract: The adrenal glands are unremarkable in appearance. The kidneys are within normal limits. There is no evidence of hydronephrosis. No renal or ureteral stones are identified. No perinephric stranding is seen. Stomach/Bowel: The stomach is unremarkable in appearance. The small bowel is within normal limits. The appendix is  not well characterized but appears grossly normal in caliber, without evidence of appendicitis. The colon is unremarkable in appearance. Vascular/Lymphatic: The abdominal aorta is unremarkable in appearance. The inferior vena cava is grossly unremarkable. No retroperitoneal lymphadenopathy is seen. No pelvic sidewall lymphadenopathy is identified. Reproductive: The bladder is moderately distended and grossly unremarkable. The uterus is grossly unremarkable. Nabothian cysts are noted at the cervix. The right ovary is grossly unremarkable. There is a very large 31.2 x 26.1 x 19.8 cm cystic mass filling much of the abdomen. Given its appearance, this more likely arises from the left ovary. Other: Mild skin thickening is noted along the patient's pannus. Known chronic skin wounds are difficult fully characterize. Musculoskeletal: No acute osseous abnormalities are identified. The visualized musculature is unremarkable in appearance. IMPRESSION: 1. Very large 31.2 x 26.1 x 19.8 cm cystic mass filling much of the abdomen. Given its appearance, this more likely arises from the left ovary. This explains the patient's worsening abdominal distention. Surgical consultation is recommended for removal of the cystic mass. 2. Mild bibasilar atelectasis noted. 3. Mild skin thickening along the patient's pannus. Electronically Signed   By: Garald Balding M.D.   On: 04/01/2017 23:12   Dg Chest Port 1 View  Result Date: 04/01/2017 CLINICAL DATA:  Weakness that began yesterday. Cellulitis. Sarcoidosis. Morbid obesity. EXAM: PORTABLE CHEST 1 VIEW COMPARISON:  Most recent chest radiograph 12/18/2016. Most recent CT chest 10/08/2013. FINDINGS: Cardiomegaly. Normal mediastinal contours. No active infiltrates or failure. Unusual air shadow under the RIGHT hemidiaphragm could simply represent colon interposition although on prior chest CT, and on prior chest radiographs, the colon is not interposed above the liver. In the appropriate  setting, the findings could represent pneumoperitoneum. Air-filled splenic flexure appears unremarkable, under the LEFT hemidiaphragm. No osseous findings. IMPRESSION: Cannot confidently exclude free air under the RIGHT hemidiaphragm, although colonic interposition above the liver could certainly have this appearance. Recommend noncontrast CT scan of the abdomen pelvis for further evaluation. Findings discussed with ordering provider. Cardiomegaly without active infiltrates or failure. Electronically Signed   By: Staci Righter M.D.   On: 04/01/2017 21:45    ROS:  Pertinent items are noted in HPI.  Blood pressure 104/64, pulse 71, temperature 98.8 F (37.1 C), temperature source Oral, resp. rate 20, height _0  (1.626 m), weight 288 lb 1.6 oz (130.7 kg), last menstrual period 11/21/2011, SpO2  98 %. Physical Exam: Pleasant obese black female in no acute distress. Extremity examination reveals bilateral femoral pulses present. She does have significant size in both legs with multiple areas of large desquamation of skin down to the subcutaneous tissue with malodorous soft eschars present. I could not palpate pedal pulses due to lymphedema. Wound care nurses note appreciated.  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 There appears to be a gap in care for this. I understand that Dr. Elonda Husky of gynecology will be operating on the patient tomorrow for a mass. This does not preclude his surgery. Given the chronicity of the wounds, I would like to start with chemical debridement and dressing changes for the wounds. Ultimately, she needs to be followed by the wound care center for ongoing care as this is going to be aa chronic issue for her. Agree with wound cares assessment and treatment plan.  Aviva Signs 04/02/2017, 2:55 PM

## 2017-04-02 NOTE — Progress Notes (Signed)
CRITICAL VALUE ALERT  Critical value received:  Potassium 2.7  Date of notification:  04/02/2017  Time of notification:  0700  Critical value read back:yes  Nurse who received alert:  Dorothyann Peng   MD notified (1st page):  Dr. Darrick Meigs  Time of first page:  [phone]  MD notified (2nd page):  Time of second page:  Responding MD:    Time MD responded:

## 2017-04-02 NOTE — Progress Notes (Signed)
Pharmacy Antibiotic Note  Kari [name] is a 56 y.o. female admitted on 04/01/2017 with cellulitis / possible bacteremia.  Pharmacy has been consulted for vancomycin and zosyn dosing.Vanc 1 gm and zosyn 3.375 gm ordered in the ED  Plan: Vanc 1gm IV q12hrs Zosyn 3.375 gm IV q8 hours f/u renal function, cultures / sensitivities and clinical course  Height: 5\' 4"  (162.6 cm) Weight: 288 lb 1.6 oz (130.7 kg) IBW/kg (Calculated) : 54.7  Temp (24hrs), Avg:98.4 F (36.9 C), Min:97.9 F (36.6 C), Max:98.8 F (37.1 C)   Recent Labs Lab 04/01/17 2145 04/02/17 0035 04/02/17 0604  WBC 12.7*  --  10.2  CREATININE 2.25*  --  1.96*  LATICACIDVEN 1.8 1.2  --     Estimated Creatinine Clearance: 43.6 mL/min (A) (by C-G formula based on SCr of 1.96 mg/dL (H)).    No Known Allergies  Antimicrobials this admission: vanc 4/9 >>  zosyn 4/9 >>   Thank you for allowing pharmacy to be a part of this patient's care.  Hart Robinsons A 04/02/2017 10:27 AM

## 2017-04-02 NOTE — Evaluation (Signed)
Physical Therapy Evaluation Patient Details Name: Kari [name] MRN: [id number] DOB: Oct 22, 1961 Today's Date: 04/02/2017   History of Present Illness  56 year old black female multiple medical problems including CHF, sarcoidosis, renal insufficiency, hypertension, and history of chronic stasis disease of lower extremities. She has been receiving treatment in the past including Unna boots. She states they have healed in the past, but recently have over the past few months become worse. She was able to ambulate, though ambulation and decreased because of this. There is some pain with palpation of the wounds noted.  Dr. Elonda Husky of gynecology will be operating on the patient tomorrow for a mass  Clinical Impression  Pt received in bed and was agreeable to PT evaluation.  Sister present.  Pt states that she is normally ambulatory with RW for household distances.  She is independent with both dressing and bathing.  Pt has large chronic wounds on B LE's.  During today's evaluation, she required Max A for rolling each direction, and Max A for supine<>sit with HOB raised, and Max A +2 for sit<>supine and positioning in the bed.  Pt is highly recommended to d/c to SNF to receive further PT to optimize her return to PLOF, however pt really wants to go back home.  She is scheduled for surgery tomorrow.  Will re-assess after surgery, however, she will likely still need SNF due to the extent of her immobility seen today.      Follow Up Recommendations SNF    Equipment Recommendations  None recommended by PT    Recommendations for Other Services       Precautions / Restrictions Precautions Precautions: Fall Restrictions Weight Bearing Restrictions: No      Mobility  Bed Mobility Overal bed mobility: Needs Assistance Bed Mobility: Supine to Sit;Sit to Supine;Rolling Rolling: Max assist;+2 for physical assistance   Supine to sit: Max assist;+2 for physical assistance Sit to supine: Max assist;+2 for  physical assistance   General bed mobility comments: bed in trendelenburg to slide pt to the [facility].   Transfers                    Ambulation/Gait                Stairs            Wheelchair Mobility    Modified Rankin (Stroke Patients Only)       Balance Overall balance assessment: Needs assistance Sitting-balance support: Bilateral upper extremity supported;Feet supported Sitting balance-Leahy Scale: Fair Sitting balance - Comments: posterior lean due to difficulty scooting to the EOB.                                       Pertinent Vitals/Pain Pain Assessment: 0-10 Pain Score: 7  Pain Location: Side of LE's.   Pain Descriptors / Indicators: Aching Pain Intervention(s): Limited activity within patient's tolerance;Monitored during session;Repositioned    Home Living   Living Arrangements: Alone Available Help at Discharge: Family (sister is getting ready to move in with her. ) Type of Home: Apartment Home Access: Level entry     Home Layout: One level Home Equipment: Bedside commode;Walker - 2 wheels      Prior Function Level of Independence: Independent with assistive device(s)   Gait / Transfers Assistance Needed: Pt ambulates household distances with RW.   ADL's / Homemaking Assistance Needed: independent with dressing and bathing.  Comments: Sister assists her with running errands.  Makes a list.       Hand Dominance        Extremity/Trunk Assessment   Upper Extremity Assessment Upper Extremity Assessment: Generalized weakness    Lower Extremity Assessment Lower Extremity Assessment: Generalized weakness       Communication   Communication: No difficulties  Cognition Arousal/Alertness: Awake/alert Behavior During Therapy: WFL for tasks assessed/performed Overall Cognitive Status: Within Functional Limits for tasks assessed                                        General Comments       Exercises General Exercises - Upper Extremity Shoulder Flexion: Strengthening;Both;10 reps;Seated Shoulder ABduction: Strengthening;Both;10 reps;Seated Elbow Flexion: Strengthening;Both;10 reps;Seated   Assessment/Plan    PT Assessment Patient needs continued PT services  PT Problem List Decreased strength;Decreased activity tolerance;Decreased balance;Decreased range of motion;Decreased mobility;Obesity;Decreased skin integrity;Pain       PT Treatment Interventions DME instruction;Gait training;Functional mobility training;Therapeutic activities;Therapeutic exercise;Balance training;Patient/family education    PT Goals (Current goals can be found in the Care Plan section)  Acute Rehab PT Goals Patient Stated Goal: To go home.  PT Goal Formulation: With patient/family Time For Goal Achievement: 04/09/17 Potential to Achieve Goals: Fair    Frequency Min 3X/week   Barriers to discharge   Sister will be staying with the patient.     Co-evaluation               End of Session   Activity Tolerance: Patient tolerated treatment well Patient left: in bed;with call bell/phone within reach;with family/visitor present Nurse Communication: Mobility status;Need for lift equipment (mobility sheet left hanging in the room.  ) PT Visit Diagnosis: Unsteadiness on feet (R26.81);Muscle weakness (generalized) (M62.81)    Time: 7001-7494 PT Time Calculation (min) (ACUTE ONLY): 41 min   Charges:   PT Evaluation $PT Eval Low Complexity: 1 Procedure PT Treatments $Therapeutic Activity: 23-37 mins   PT G Codes:   PT G-Codes **NOT FOR INPATIENT CLASS** Functional Assessment Tool Used: AM-PAC 6 Clicks Basic Mobility;Clinical judgement Functional Limitation: Mobility: Walking and moving around Mobility: Walking and Moving Around Current Status (W9675): At least 80 percent but less than 100 percent impaired, limited or restricted Mobility: Walking and Moving Around Goal Status [phone]):  At least 60 percent but less than 80 percent impaired, limited or restricted    Beth Lanijah Warzecha, PT, DPT X: [phone]

## 2017-04-02 NOTE — Plan of Care (Signed)
Problem: Skin Integrity: Goal: Risk for impaired skin integrity will decrease Outcome: Not Progressing Pt has history of chronic ulcers. Pt has ulcer on R and L leg, R foot as well as skin tear of L elbow. Wound to see pt today. Dressings changed. Pt has no c/o pai, only when changing dressing.

## 2017-04-02 NOTE — Progress Notes (Signed)
Initial Nutrition Assessment  DOCUMENTATION CODES:   Morbid obesity  INTERVENTION:  ProStat 60 ml BID (each 60 ml provides 200 kcal, 30 gr protein) due to pt increased protein needs for wound healing and repletion.   Multivitamin daily  Recommend daily weight monitoring  NUTRITION DIAGNOSIS:   Increased nutrient needs related to wound healing as evidenced by estimated needs.   GOAL:   Patient will meet greater than or equal to 90% of their needs    MONITOR:   PO intake, Skin, Labs, Supplement acceptance, Weight trends  REASON FOR ASSESSMENT:   Low Braden    ASSESSMENT: Kari [name] is a 56 yo female with hx of chronic venous status ulcers, CHF and HTN. She presents to ED after a fall and c/o increased weakness, dehydration. CT findings abdomen and pelvis: "very large cystic mass filling much of abdomen". The patient is scheduled for surgery in the morning.   RD drawn to pt due to her braden score. She is eating poorly 0-25% of breakfast this morning. She is able to feed herself but mobility is limited which impedes her ability to shop for food and prepare her meals as well as she would like.  Her weight hx has some significant fluctuations which is likely associated with fluid status changes given her hx of CHF. The past 1.5 years she has maintained 130-136 kg.    Partial Nutrition-Focused physical exam:  no fat or depletion, no muscle depletion.    Recent Labs Lab 04/01/17 2145 04/02/17 0604 04/02/17 0632  NA 136 137  --   K 2.6* 2.7*  --   CL 105 109  --   CO2 19* 21*  --   BUN 27* 25*  --   CREATININE 2.25* 1.96*  --   CALCIUM 9.2 8.3*  --   MG  --   --  2.2  GLUCOSE 117* 113*  --    Labs: Potssium 2.7, Albumin 2.2. BUN 25, Cr 1.96 (AKI)  Diet Order:  Diet regular Room service appropriate? 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  Estimated Nutritional Needs:   Kcal:  2269 (AF-1.2 [place])  Protein:  113 gr  (20% of total kcal )  Fluid:  2.3 liters daily (1 ml/kcal/kg)  EDUCATION NEEDS:   Education needs addressed (Protein sources:  lean choices)  Kari Cater Kari,RD,CSG,LDN Office: [phone] Pager: [phone]

## 2017-04-03 ENCOUNTER — Encounter (HOSPITAL_COMMUNITY)
Admission: EM | Disposition: A | Discharge status: Skilled Nursing Facility | Payer: Self-pay | Source: Home / Self Care | Attending: Internal Medicine

## 2017-04-03 ENCOUNTER — Encounter (HOSPITAL_COMMUNITY): Payer: Self-pay | Admitting: Anesthesiology

## 2017-04-03 LAB — CBC
HCT: 26.7 % — ABNORMAL LOW (ref 36.0–46.0)
Hemoglobin: 8.7 g/dL — ABNORMAL LOW (ref 12.0–15.0)
MCH: 28.8 pg (ref 26.0–34.0)
MCHC: 32.6 g/dL (ref 30.0–36.0)
MCV: 88.4 fL (ref 78.0–100.0)
Platelets: 243 10*3/uL (ref 150–400)
RBC: 3.02 MIL/uL — ABNORMAL LOW (ref 3.87–5.11)
RDW: 16.7 % — ABNORMAL HIGH (ref 11.5–15.5)
WBC: 7.7 10*3/uL (ref 4.0–10.5)

## 2017-04-03 LAB — BASIC METABOLIC PANEL
Anion gap: 7 (ref 5–15)
BUN: 22 mg/dL — ABNORMAL HIGH (ref 6–20)
CO2: 22 mmol/L (ref 22–32)
Calcium: 8.3 mg/dL — ABNORMAL LOW (ref 8.9–10.3)
Chloride: 110 mmol/L (ref 101–111)
Creatinine, Ser: 1.74 mg/dL — ABNORMAL HIGH (ref 0.44–1.00)
GFR calc Af Amer: 37 mL/min — ABNORMAL LOW (ref >60)
GFR calc non Af Amer: 32 mL/min — ABNORMAL LOW (ref >60)
Glucose, Bld: 96 mg/dL (ref 65–99)
Potassium: 2.7 mmol/L — CL (ref 3.5–5.1)
Sodium: 139 mmol/L (ref 135–145)

## 2017-04-03 LAB — HIV ANTIBODY (ROUTINE TESTING W REFLEX): HIV Screen 4th Generation wRfx: NONREACTIVE

## 2017-04-03 LAB — CA 125: CA 125: 17.4 U/mL (ref 0.0–38.1)

## 2017-04-03 SURGERY — HYSTERECTOMY, ABDOMINAL
Anesthesia: General

## 2017-04-03 MED ORDER — CHLORHEXIDINE GLUCONATE CLOTH 2 % EX PADS
6.0000 | MEDICATED_PAD | Freq: Every day | CUTANEOUS | Status: DC
  Administered 2017-04-03: 6 via TOPICAL

## 2017-04-03 MED ORDER — SODIUM CHLORIDE 0.45 % IV SOLN
INTRAVENOUS | Status: DC
  Administered 2017-04-03 – 2017-04-05 (×4): via INTRAVENOUS
  Filled 2017-04-03 (×5): qty 1000

## 2017-04-03 MED ORDER — POTASSIUM CHLORIDE CRYS ER 20 MEQ PO TBCR
40.0000 meq | EXTENDED_RELEASE_TABLET | Freq: Three times a day (TID) | ORAL | Status: DC
  Administered 2017-04-03 – 2017-04-06 (×10): 40 meq via ORAL
  Filled 2017-04-03 (×10): qty 2

## 2017-04-03 MED ORDER — FERUMOXYTOL INJECTION 510 MG/17 ML
510.0000 mg | Freq: Once | INTRAVENOUS | Status: AC
  Administered 2017-04-03: 510 mg via INTRAVENOUS
  Filled 2017-04-03: qty 17

## 2017-04-03 SURGICAL SUPPLY — 45 items
APPLIER CLIP 13 LRG OPEN (CLIP)
BAG HAMPER (MISCELLANEOUS) IMPLANT
CELLS DAT CNTRL 66122 CELL SVR (MISCELLANEOUS) IMPLANT
CLIP APPLIE 13 LRG OPEN (CLIP) IMPLANT
CLOTH BEACON ORANGE TIMEOUT ST (SAFETY) IMPLANT
COVER LIGHT HANDLE STERIS (MISCELLANEOUS) IMPLANT
DERMABOND ADVANCED (GAUZE/BANDAGES/DRESSINGS)
DERMABOND ADVANCED .7 DNX12 (GAUZE/BANDAGES/DRESSINGS) IMPLANT
DRAPE WARM FLUID 44X44 (DRAPE) IMPLANT
DRSG OPSITE POSTOP 4X8 (GAUZE/BANDAGES/DRESSINGS) IMPLANT
DRSG TELFA 3X8 NADH (GAUZE/BANDAGES/DRESSINGS) IMPLANT
ELECT REM PT RETURN 9FT ADLT (ELECTROSURGICAL)
ELECTRODE REM PT RTRN 9FT ADLT (ELECTROSURGICAL) IMPLANT
FORMALIN 10 PREFIL 480ML (MISCELLANEOUS) IMPLANT
GLOVE BIOGEL PI IND STRL 7.0 (GLOVE) IMPLANT
GLOVE BIOGEL PI IND STRL 8 (GLOVE) IMPLANT
GLOVE BIOGEL PI INDICATOR 7.0 (GLOVE)
GLOVE BIOGEL PI INDICATOR 8 (GLOVE)
GLOVE ECLIPSE 8.0 STRL XLNG CF (GLOVE) IMPLANT
GOWN STRL REUS W/TWL LRG LVL3 (GOWN DISPOSABLE) IMPLANT
GOWN STRL REUS W/TWL XL LVL3 (GOWN DISPOSABLE) IMPLANT
INST SET MAJOR GENERAL (KITS) IMPLANT
KIT ROOM TURNOVER APOR (KITS) IMPLANT
MANIFOLD NEPTUNE II (INSTRUMENTS) IMPLANT
NEEDLE HYPO 21X1.5 SAFETY (NEEDLE) IMPLANT
NS IRRIG 1000ML POUR BTL (IV SOLUTION) IMPLANT
PACK ABDOMINAL MAJOR (CUSTOM PROCEDURE TRAY) IMPLANT
PAD ARMBOARD 7.5X6 YLW CONV (MISCELLANEOUS) IMPLANT
RETRACTOR WND ALEXIS 25 LRG (MISCELLANEOUS) IMPLANT
RTRCTR WOUND ALEXIS 18CM MED (MISCELLANEOUS)
RTRCTR WOUND ALEXIS 25CM LRG (MISCELLANEOUS)
SET BASIN LINEN APH (SET/KITS/TRAYS/PACK) IMPLANT
STAPLER VISISTAT 35W (STAPLE) IMPLANT
SUT CHROMIC 0 CT 1 (SUTURE) IMPLANT
SUT MON AB 3-0 SH 27 (SUTURE) IMPLANT
SUT PLAIN 2 0 XLH (SUTURE) IMPLANT
SUT VIC AB 0 CT1 27 (SUTURE)
SUT VIC AB 0 CT1 27XBRD ANTBC (SUTURE) IMPLANT
SUT VIC AB 0 CT1 27XCR 8 STRN (SUTURE) IMPLANT
SUT VIC AB 0 CTX 36 (SUTURE)
SUT VIC AB 0 CTX36XBRD ANTBCTR (SUTURE) IMPLANT
SUT VICRYL 3 0 (SUTURE) IMPLANT
SYR 20CC LL (SYRINGE) IMPLANT
TOWEL BLUE STERILE X RAY DET (MISCELLANEOUS) IMPLANT
TRAY FOLEY W/METER SILVER 16FR (SET/KITS/TRAYS/PACK) IMPLANT

## 2017-04-03 NOTE — Clinical Social Work Placement (Signed)
   CLINICAL SOCIAL WORK PLACEMENT  NOTE  Date:  04/03/2017  Patient Details  Name: Kari [name] MRN: [id number] Date of Birth: 06/21/1961  Clinical Social Work is seeking post-discharge placement for this patient at the [place] level of care (*CSW will initial, date and re-position this form in  chart as items are completed):  Yes   Patient/family provided with [facility] Work Department's list of facilities offering this level of care within the geographic area requested by the patient (or if unable, by the patient's family).  Yes   Patient/family informed of their freedom to choose among providers that offer the needed level of care, that participate in Medicare, Medicaid or managed care program needed by the patient, have an available bed and are willing to accept the patient.  Yes   Patient/family informed of [place]'s ownership interest in [facility] and [facility], as well as of the fact that they are under no obligation to receive care at these facilities.  PASRR submitted to EDS on       PASRR number received on       Existing PASRR number confirmed on 04/03/17     FL2 transmitted to all facilities in geographic area requested by pt/family on 04/03/17     FL2 transmitted to all facilities within larger geographic area on       Patient informed that his/her managed care company has contracts with or will negotiate with certain facilities, including the following:            Patient/family informed of bed offers received.  Patient chooses bed at       Physician recommends and patient chooses bed at      Patient to be transferred to   on  .  Patient to be transferred to facility by       Patient family notified on   of transfer.  Name of family member notified:        PHYSICIAN Please sign FL2     Additional Comment:    _______________________________________________ [place], LCSW 04/03/2017, 3:21  PM

## 2017-04-03 NOTE — Progress Notes (Signed)
Subjective: Patient is resting and receiving IV antibiotics. Patient was evaluated by GYN and surgery. She was planned for surgery by GYN today, however, her K+ remained 2.7 and she also anemic. Her surgery is going to be rescheduled to later date when she is more stable and her electrolyte and hemoglobin improves.  Objective: Vital signs in last 24 hours: Temp:  [98 F (36.7 C)-100.3 F (37.9 C)] 99.4 F (37.4 C) (04/11 0521) Pulse Rate:  [77-91] 79 (04/11 0521) Resp:  [18-20] 20 (04/11 0521) BP: (87-106)/(34-61) 94/51 (04/11 0521) SpO2:  [97 %-100 %] 100 % (04/11 0521) Weight change:  Last BM Date: 04/01/17  Intake/Output from previous day: 04/10 0701 - 04/11 0700 In: 4275.4 [P.O.:830; I.V.:1473.8; Blood:621.7; IV Piggyback:1350] Out: -   PHYSICAL EXAM General appearance: fatigued and no distress Resp: clear to auscultation bilaterally Cardio: S1, S2 normal GI: soft, non-tender; bowel sounds normal; no masses,  no organomegaly Extremities: venous stasis dermatitis noted and bilateral lower extremities ulcerations with bad odor  Lab Results:  Results for orders placed or performed during the hospital encounter of 04/01/17 (from the past 48 hour(s))  Urinalysis, Routine w reflex microscopic     Status: Abnormal   Collection Time: 04/01/17  9:04 PM  Result Value Ref Range   Color, Urine YELLOW YELLOW   APPearance HAZY (A) CLEAR   Specific Gravity, Urine 1.018 1.005 - 1.030   pH 5.0 5.0 - 8.0   Glucose, UA NEGATIVE NEGATIVE mg/dL   Hgb urine dipstick LARGE (A) NEGATIVE   Bilirubin Urine NEGATIVE NEGATIVE   Ketones, ur NEGATIVE NEGATIVE mg/dL   Protein, ur 30 (A) NEGATIVE mg/dL   Nitrite NEGATIVE NEGATIVE   Leukocytes, UA MODERATE (A) NEGATIVE   RBC / HPF 0-5 0 - 5 RBC/hpf   WBC, UA 0-5 0 - 5 WBC/hpf   Bacteria, UA RARE (A) NONE SEEN   Squamous Epithelial / LPF 6-30 (A) NONE SEEN   Mucous PRESENT    Hyaline Casts, UA PRESENT   Comprehensive metabolic panel     Status:  Abnormal   Collection Time: 04/01/17  9:45 PM  Result Value Ref Range   Sodium 136 135 - 145 mmol/L   Potassium 2.6 (LL) 3.5 - 5.1 mmol/L    Comment: CRITICAL RESULT CALLED TO, READ BACK BY AND VERIFIED WITH: HYAIT,C AT 2254 ON 4.9.18 BY ISLEY,B    Chloride 105 101 - 111 mmol/L   CO2 19 (L) 22 - 32 mmol/L   Glucose, Bld 117 (H) 65 - 99 mg/dL   BUN 27 (H) 6 - 20 mg/dL   Creatinine, Ser 2.25 (H) 0.44 - 1.00 mg/dL   Calcium 9.2 8.9 - 10.3 mg/dL   Total Protein 7.9 6.5 - 8.1 g/dL   Albumin 2.8 (L) 3.5 - 5.0 g/dL   AST 109 (H) 15 - 41 U/L   ALT 30 14 - 54 U/L   Alkaline Phosphatase 43 38 - 126 U/L   Total Bilirubin 0.9 0.3 - 1.2 mg/dL   GFR calc non Af Amer 23 (L) >60 mL/min   GFR calc Af Amer 27 (L) >60 mL/min    Comment: (NOTE) The eGFR has been calculated using the CKD EPI equation. This calculation has not been validated in all clinical situations. eGFR's persistently <60 mL/min signify possible Chronic Kidney Disease.    Anion gap 12 5 - 15  CBC WITH DIFFERENTIAL     Status: Abnormal   Collection Time: 04/01/17  9:45 PM  Result Value Ref Range  WBC 12.7 (H) 4.0 - 10.5 K/uL   RBC 3.40 (L) 3.87 - 5.11 MIL/uL   Hemoglobin 9.7 (L) 12.0 - 15.0 g/dL   HCT 29.7 (L) 36.0 - 46.0 %   MCV 87.4 78.0 - 100.0 fL   MCH 28.5 26.0 - 34.0 pg   MCHC 32.7 30.0 - 36.0 g/dL   RDW 16.5 (H) 11.5 - 15.5 %   Platelets 313 150 - 400 K/uL   Neutrophils Relative % 83 %   Neutro Abs 10.4 (H) 1.7 - 7.7 K/uL   Lymphocytes Relative 8 %   Lymphs Abs 1.1 0.7 - 4.0 K/uL   Monocytes Relative 9 %   Monocytes Absolute 1.2 (H) 0.1 - 1.0 K/uL   Eosinophils Relative 0 %   Eosinophils Absolute 0.0 0.0 - 0.7 K/uL   Basophils Relative 0 %   Basophils Absolute 0.0 0.0 - 0.1 K/uL  Lactic acid, plasma     Status: None   Collection Time: 04/01/17  9:45 PM  Result Value Ref Range   Lactic Acid, Venous 1.8 0.5 - 1.9 mmol/L  Blood Culture (routine x 2)     Status: None (Preliminary result)   Collection Time:  04/01/17  9:47 PM  Result Value Ref Range   Specimen Description LEFT ANTECUBITAL    Special Requests      BOTTLES DRAWN AEROBIC ONLY Blood Culture results may not be optimal due to an inadequate volume of blood received in culture bottles   Culture NO GROWTH < 12 HOURS    Report Status PENDING   Blood Culture (routine x 2)     Status: None (Preliminary result)   Collection Time: 04/01/17  9:52 PM  Result Value Ref Range   Specimen Description BLOOD LEFT WRIST    Special Requests      BOTTLES DRAWN AEROBIC ONLY Blood Culture results may not be optimal due to an inadequate volume of blood received in culture bottles   Culture NO GROWTH < 12 HOURS    Report Status PENDING   Lactic acid, plasma     Status: None   Collection Time: 04/02/17 12:35 AM  Result Value Ref Range   Lactic Acid, Venous 1.2 0.5 - 1.9 mmol/L  CA 125     Status: None   Collection Time: 04/02/17 12:35 AM  Result Value Ref Range   CA 125 17.4 0.0 - 38.1 U/mL    Comment: (NOTE) Roche ECLIA methodology Performed At: [facility] [place], [place] [id number] Lindon Romp MD [id number]   CBC     Status: Abnormal   Collection Time: 04/02/17  6:04 AM  Result Value Ref Range   WBC 10.2 4.0 - 10.5 K/uL   RBC 3.05 (L) 3.87 - 5.11 MIL/uL   Hemoglobin 8.7 (L) 12.0 - 15.0 g/dL   HCT 26.5 (L) 36.0 - 46.0 %   MCV 86.9 78.0 - 100.0 fL   MCH 28.5 26.0 - 34.0 pg   MCHC 32.8 30.0 - 36.0 g/dL   RDW 16.6 (H) 11.5 - 15.5 %   Platelets 262 150 - 400 K/uL  Comprehensive metabolic panel     Status: Abnormal   Collection Time: 04/02/17  6:04 AM  Result Value Ref Range   Sodium 137 135 - 145 mmol/L   Potassium 2.7 (LL) 3.5 - 5.1 mmol/L    Comment: CRITICAL RESULT CALLED TO, READ BACK BY AND VERIFIED WITH: AMBURN,A AT 6:55AM ON 04/02/17 BY FESTERMAN,C    Chloride 109 101 -  111 mmol/L   CO2 21 (L) 22 - 32 mmol/L   Glucose, Bld 113 (H) 65 - 99 mg/dL   BUN 25 (H) 6 - 20 mg/dL   Creatinine, Ser 1.96  (H) 0.44 - 1.00 mg/dL   Calcium 8.3 (L) 8.9 - 10.3 mg/dL   Total Protein 6.6 6.5 - 8.1 g/dL   Albumin 2.2 (L) 3.5 - 5.0 g/dL   AST 89 (H) 15 - 41 U/L   ALT 25 14 - 54 U/L   Alkaline Phosphatase 34 (L) 38 - 126 U/L   Total Bilirubin 0.8 0.3 - 1.2 mg/dL   GFR calc non Af Amer 28 (L) >60 mL/min   GFR calc Af Amer 32 (L) >60 mL/min    Comment: (NOTE) The eGFR has been calculated using the CKD EPI equation. This calculation has not been validated in all clinical situations. eGFR's persistently <60 mL/min signify possible Chronic Kidney Disease.    Anion gap 7 5 - 15  HIV antibody     Status: None   Collection Time: 04/02/17  6:07 AM  Result Value Ref Range   HIV Screen 4th Generation wRfx Non Reactive Non Reactive    Comment: (NOTE) Performed At: [facility] [place], [place] [id number] Lindon Romp MD WU:[id number]   Magnesium     Status: None   Collection Time: 04/02/17  6:32 AM  Result Value Ref Range   Magnesium 2.2 1.7 - 2.4 mg/dL  Type and screen [facility]     Status: None (Preliminary result)   Collection Time: 04/02/17 11:23 AM  Result Value Ref Range   ABO/RH(D) O POS    Antibody Screen NEG    Sample Expiration 04/05/2017    Unit Number [id number]    Blood Component Type RED CELLS,LR    Unit division 00    Status of Unit ISSUED,FINAL    Transfusion Status OK TO TRANSFUSE    Crossmatch Result Compatible    Unit Number [id number]    Blood Component Type RED CELLS,LR    Unit division 00    Status of Unit ALLOCATED    Transfusion Status OK TO TRANSFUSE    Crossmatch Result Compatible    Unit Number [id number]    Blood Component Type RBC LR PHER2    Unit division 00    Status of Unit ALLOCATED    Transfusion Status OK TO TRANSFUSE    Crossmatch Result Compatible   Prepare RBC     Status: None   Collection Time: 04/02/17 11:23 AM  Result Value Ref Range   Order Confirmation ORDER PROCESSED BY BLOOD BANK   ABO/Rh      Status: None   Collection Time: 04/02/17 11:23 AM  Result Value Ref Range   ABO/RH(D) O POS   Prepare RBC (crossmatch)     Status: None   Collection Time: 04/02/17 12:30 PM  Result Value Ref Range   Order Confirmation ORDER PROCESSED BY BLOOD BANK   Surgical PCR screen     Status: None   Collection Time: 04/02/17  4:00 PM  Result Value Ref Range   MRSA, PCR NEGATIVE NEGATIVE   Staphylococcus aureus NEGATIVE NEGATIVE    Comment:        The Xpert SA Assay (FDA approved for NASAL specimens in patients over 69 years of age), is one component of a comprehensive surveillance program.  Test performance has been validated by [facility] for patients greater than or equal to 37 year old.  It is not intended to diagnose infection nor to guide or monitor treatment.   Basic metabolic panel     Status: Abnormal   Collection Time: 04/03/17  5:41 AM  Result Value Ref Range   Sodium 139 135 - 145 mmol/L   Potassium 2.7 (LL) 3.5 - 5.1 mmol/L    Comment: CRITICAL RESULT CALLED TO, READ BACK BY AND VERIFIED WITH: GLENN,T AT 7:20AM ON 04/03/17 BY FESTERMAN,C    Chloride 110 101 - 111 mmol/L   CO2 22 22 - 32 mmol/L   Glucose, Bld 96 65 - 99 mg/dL   BUN 22 (H) 6 - 20 mg/dL   Creatinine, Ser 1.74 (H) 0.44 - 1.00 mg/dL   Calcium 8.3 (L) 8.9 - 10.3 mg/dL   GFR calc non Af Amer 32 (L) >60 mL/min   GFR calc Af Amer 37 (L) >60 mL/min    Comment: (NOTE) The eGFR has been calculated using the CKD EPI equation. This calculation has not been validated in all clinical situations. eGFR's persistently <60 mL/min signify possible Chronic Kidney Disease.    Anion gap 7 5 - 15  CBC     Status: Abnormal   Collection Time: 04/03/17  5:41 AM  Result Value Ref Range   WBC 7.7 4.0 - 10.5 K/uL   RBC 3.02 (L) 3.87 - 5.11 MIL/uL   Hemoglobin 8.7 (L) 12.0 - 15.0 g/dL   HCT 26.7 (L) 36.0 - 46.0 %   MCV 88.4 78.0 - 100.0 fL   MCH 28.8 26.0 - 34.0 pg   MCHC 32.6 30.0 - 36.0 g/dL   RDW 16.7 (H) 11.5 - 15.5 %    Platelets 243 150 - 400 K/uL    ABGS No results for input(s): PHART, PO2ART, TCO2, HCO3 in the last 72 hours.  Invalid input(s): PCO2 CULTURES Recent Results (from the past 240 hour(s))  Blood Culture (routine x 2)     Status: None (Preliminary result)   Collection Time: 04/01/17  9:47 PM  Result Value Ref Range Status   Specimen Description LEFT ANTECUBITAL  Final   Special Requests   Final    BOTTLES DRAWN AEROBIC ONLY Blood Culture results may not be optimal due to an inadequate volume of blood received in culture bottles   Culture NO GROWTH < 12 HOURS  Final   Report Status PENDING  Incomplete  Blood Culture (routine x 2)     Status: None (Preliminary result)   Collection Time: 04/01/17  9:52 PM  Result Value Ref Range Status   Specimen Description BLOOD LEFT WRIST  Final   Special Requests   Final    BOTTLES DRAWN AEROBIC ONLY Blood Culture results may not be optimal due to an inadequate volume of blood received in culture bottles   Culture NO GROWTH < 12 HOURS  Final   Report Status PENDING  Incomplete  Surgical PCR screen     Status: None   Collection Time: 04/02/17  4:00 PM  Result Value Ref Range Status   MRSA, PCR NEGATIVE NEGATIVE Final   Staphylococcus aureus NEGATIVE NEGATIVE Final    Comment:        The Xpert SA Assay (FDA approved for NASAL specimens in patients over 60 years of age), is one component of a comprehensive surveillance program.  Test performance has been validated by [facility] for patients greater than or equal to 64 year old. It is not intended to diagnose infection nor to guide or monitor treatment.    Studies/Results:  Ct Abdomen Pelvis Wo Contrast  Result Date: 04/01/2017 CLINICAL DATA:  Acute onset of worsening generalized weakness. Disorientation and dehydration. Increasing drainage from chronic skin wounds, with generalized abdominal pain and distention. Initial encounter. EXAM: CT ABDOMEN AND PELVIS WITHOUT CONTRAST TECHNIQUE:  Multidetector CT imaging of the abdomen and pelvis was performed following the standard protocol without IV contrast. COMPARISON:  Renal ultrasound performed 12/18/2016 FINDINGS: Lower chest: Mild bibasilar atelectasis noted. The heart is borderline normal in size. Hepatobiliary: The liver is unremarkable in appearance. The gallbladder is difficult to fully assess due to motion artifact, but appears grossly unremarkable. The common bile duct remains normal in caliber. Pancreas: The pancreas is within normal limits. Spleen: The spleen is unremarkable in appearance. Adrenals/Urinary Tract: The adrenal glands are unremarkable in appearance. The kidneys are within normal limits. There is no evidence of hydronephrosis. No renal or ureteral stones are identified. No perinephric stranding is seen. Stomach/Bowel: The stomach is unremarkable in appearance. The small bowel is within normal limits. The appendix is not well characterized but appears grossly normal in caliber, without evidence of appendicitis. The colon is unremarkable in appearance. Vascular/Lymphatic: The abdominal aorta is unremarkable in appearance. The inferior vena cava is grossly unremarkable. No retroperitoneal lymphadenopathy is seen. No pelvic sidewall lymphadenopathy is identified. Reproductive: The bladder is moderately distended and grossly unremarkable. The uterus is grossly unremarkable. Nabothian cysts are noted at the cervix. The right ovary is grossly unremarkable. There is a very large 31.2 x 26.1 x 19.8 cm cystic mass filling much of the abdomen. Given its appearance, this more likely arises from the left ovary. Other: Mild skin thickening is noted along the patient's pannus. Known chronic skin wounds are difficult fully characterize. Musculoskeletal: No acute osseous abnormalities are identified. The visualized musculature is unremarkable in appearance. IMPRESSION: 1. Very large 31.2 x 26.1 x 19.8 cm cystic mass filling much of the abdomen.  Given its appearance, this more likely arises from the left ovary. This explains the patient's worsening abdominal distention. Surgical consultation is recommended for removal of the cystic mass. 2. Mild bibasilar atelectasis noted. 3. Mild skin thickening along the patient's pannus. Electronically Signed   By: Garald Balding M.D.   On: 04/01/2017 23:12   Dg Chest Port 1 View  Result Date: 04/01/2017 CLINICAL DATA:  Weakness that began yesterday. Cellulitis. Sarcoidosis. Morbid obesity. EXAM: PORTABLE CHEST 1 VIEW COMPARISON:  Most recent chest radiograph 12/18/2016. Most recent CT chest 10/08/2013. FINDINGS: Cardiomegaly. Normal mediastinal contours. No active infiltrates or failure. Unusual air shadow under the RIGHT hemidiaphragm could simply represent colon interposition although on prior chest CT, and on prior chest radiographs, the colon is not interposed above the liver. In the appropriate setting, the findings could represent pneumoperitoneum. Air-filled splenic flexure appears unremarkable, under the LEFT hemidiaphragm. No osseous findings. IMPRESSION: Cannot confidently exclude free air under the RIGHT hemidiaphragm, although colonic interposition above the liver could certainly have this appearance. Recommend noncontrast CT scan of the abdomen pelvis for further evaluation. Findings discussed with ordering provider. Cardiomegaly without active infiltrates or failure. Electronically Signed   By: Staci Righter M.D.   On: 04/01/2017 21:45    Medications: I have reviewed the patient's current medications.  Assesment:  Active Problems:   Venous stasis ulcers (HCC)   Volume depletion   AKI (acute kidney injury) ([place])   Hypokalemia   Cellulitis ovarian mass fall   Plan:  Medications reviewed Continue combination IV antibiotics Continue wound care Continue K+ supplement GYN consult appreciated Surgical  consult appreciated Will do nephrology consult     LOS: 1 day    FANTA,TESFAYE 04/03/2017, 8:13 AM

## 2017-04-03 NOTE — [place] FL2 (Signed)
[place] MEDICAID FL2 LEVEL OF CARE SCREENING TOOL     IDENTIFICATION  Patient Name: Kari [name] Birthdate: Sep 29, 1961 Sex: female Admission Date (Current Location): 04/01/2017  [facility] and [place] Number:  [employer] and Address:  [place] [facility] [address] [place]      Provider Number: [id number]  Attending Physician Name and Address:  Rosita Fire, MD  Relative Name and Phone Number:       Current Level of Care: Hospital Recommended Level of Care: [place] Prior Approval Number:    Date Approved/Denied:   PASRR Number:   [id number]  Discharge Plan: SNF    Current Diagnoses: Patient Active Problem List   Diagnosis Date Noted  . Cellulitis 04/02/2017  . AKI (acute kidney injury) ([place]) 12/17/2016  . Cellulitis and abscess of right lower extremity 12/17/2016  . Syncope and collapse 12/17/2016  . Peripheral edema 12/17/2016  . Hypokalemia 12/17/2016  . Anemia 12/17/2016  . Acute diastolic CHF (congestive heart failure) ([place]) 03/14/2014  . CHF (congestive heart failure) ([place]) 03/12/2014  . Sarcoidosis ([place]) 03/12/2014  . Severe sepsis ([place]) 10/17/2013  . ARF (acute renal failure) ([place]) 10/08/2013  . Cellulitis and abscess 10/08/2013  . Obesity 10/08/2013  . Volume depletion 10/08/2013  . Unspecified venous (peripheral) insufficiency 10/28/2012  . Mediastinal lymphadenopathy 10/16/2012  . Pulmonary nodules 10/16/2012  . 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 Chest pain 09/02/2012  . Asthma   . Hypertension   . Depression   . Venous stasis ulcers ([place]) 07/29/2012  . 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 Venous ulcer of leg ([place]) 07/08/2012    Orientation RESPIRATION BLADDER Height & Weight     Self, Place, Situation, Time  Normal Continent Weight: 288 lb 1.6 oz (130.7 kg) Height:  5\' 4"  (162.6 cm)  BEHAVIORAL  SYMPTOMS/MOOD NEUROLOGICAL BOWEL NUTRITION STATUS      Continent Diet (See DC summary)  AMBULATORY STATUS COMMUNICATION OF NEEDS Skin   Extensive Assist Verbally Other (Comment)   Reason for Consult: LE cellulitis  Wound type:  Full thickness ulcerations bilaterally related to lymphadema/venous stasis She is noted to have a fluid filled bulla on the plantar surface of the right foot and skin changes on this surface as well.  MASD (moisture associated skin damage) inner thighs IAD (intertriginous dermatitis); under her pannus Pressure Injury POA: No Measurement: Unable to obtain accurate measurements of the leg wounds, noted to be at least 20-23 cm x 25cm bilaterally Scattered partial thickness skin loss between her thighs, intense redness under the pannus Wound bed: Bilateral LE wound are necrotic100%; with foul thick purulent drainage. Under the pannus and inner thighs pink, skin intact mostly  Drainage (amount, consistency, odor) see above Periwound: venous stasis dermatitis and skin changes bilaterally. Dressing procedure/placement/frequency:                     Personal Care Assistance Level of Assistance  Bathing, Feeding, Dressing Bathing Assistance: Limited assistance Feeding assistance: Independent Dressing Assistance: Limited assistance     Functional Limitations Info  Sight, Hearing, Speech Sight Info: Adequate Hearing Info: Adequate Speech Info: Adequate    SPECIAL CARE FACTORS FREQUENCY  PT (By licensed PT), OT (By licensed OT)     PT Frequency: 5x OT Frequency: 5x            Contractures Contractures Info: Not present    Additional Factors Info  Code Status, Allergies Code Status Info: Full Code Allergies Info: NKA           Current Medications (04/03/2017):  This is the current hospital active medication list Current Facility-Administered Medications  Medication Dose Route Frequency Provider Last Rate Last Dose  . acetaminophen (TYLENOL)  tablet 650 mg  650 mg Oral Q6H PRN Oswald Hillock, MD       Or  . acetaminophen (TYLENOL) suppository 650 mg  650 mg Rectal Q6H PRN Oswald Hillock, MD      . aspirin EC tablet 81 mg  81 mg Oral Daily Oswald Hillock, MD   81 mg at 04/03/17 1038  . Chlorhexidine Gluconate Cloth 2 % PADS 6 each  6 each Topical Q0600 Florian Buff, MD   6 each at 04/03/17 0631  . feeding supplement (PRO-STAT SUGAR FREE 64) liquid 60 mL  60 mL Oral BID BM Rosita Fire, MD   60 mL at 04/03/17 1039  . HYDROcodone-acetaminophen (NORCO/VICODIN) 5-325 MG per tablet 1-2 tablet  1-2 tablet Oral Q6H PRN Rosita Fire, MD   2 tablet at 04/03/17 1045  . multivitamin with minerals tablet 1 tablet  1 tablet Oral Daily Rosita Fire, MD   1 tablet at 04/03/17 1038  . ondansetron (ZOFRAN) tablet 4 mg  4 mg Oral Q6H PRN Oswald Hillock, MD       Or  . ondansetron (ZOFRAN) injection 4 mg  4 mg Intravenous Q6H PRN Oswald Hillock, MD      . piperacillin-tazobactam (ZOSYN) IVPB 3.375 g  3.375 g Intravenous Q8H Dorie Rank, MD   3.375 g at 04/03/17 1311  . potassium chloride SA (K-DUR,KLOR-CON) CR tablet 40 mEq  40 mEq Oral TID Rosita Fire, MD   40 mEq at 04/03/17 1039  . sodium chloride 0.45 % 1,000 mL with potassium chloride 40 mEq infusion   Intravenous Continuous Rosita Fire, MD 75 mL/hr at 04/03/17 1040    . sodium hypochlorite (DAKIN'S 1/4 STRENGTH) topical solution   Irrigation BID Rosita Fire, MD      . vancomycin (VANCOCIN) IVPB 1000 mg/200 mL premix  1,000 mg Intravenous Q12H Dorie Rank, MD 200 mL/hr at 04/03/17 1039 1,000 mg at 04/03/17 1039     Discharge Medications: Please see discharge summary for a list of discharge medications.  Relevant Imaging Results:  Relevant Lab Results:   Additional Information SSN:  [id number]  [place], [place]

## 2017-04-03 NOTE — Progress Notes (Signed)
Physical Therapy Treatment Patient Details Name: Kari [name] MRN: [id number] DOB: [birth year] Today's Date: 04/03/2017    History of Present Illness 56 year old black female multiple medical problems including CHF, sarcoidosis, renal insufficiency, hypertension, and history of chronic stasis disease of lower extremities. She has been receiving treatment in the past including Unna boots. She states they have healed in the past, but recently have over the past few months become worse. She was able to ambulate, though ambulation and decreased because of this. There is some pain with palpation of the wounds noted.  Dr. Elonda Husky of gynecology will be operating on the patient tomorrow for a mass    PT Comments    Pt received in bed, and was agreeable to PT tx.  Sister present.  Pt expressed fatigue and pain today, and has also had low BP today.  Pt was able to participate in exercises in the bed.  Pt did not have surgery today due to low Hgb, as well as Potassium.  It has been postponed for a later date.  Continue to recommend SNF at this time.     Follow Up Recommendations  SNF     Equipment Recommendations       Recommendations for Other Services       Precautions / Restrictions Precautions Precautions: Fall Restrictions Weight Bearing Restrictions: No    Mobility  Bed Mobility                  Transfers                    Ambulation/Gait                 Stairs            Wheelchair Mobility    Modified Rankin (Stroke Patients Only)       Balance                                            Cognition Arousal/Alertness: Awake/alert Behavior During Therapy: WFL for tasks assessed/performed Overall Cognitive Status: Within Functional Limits for tasks assessed                                        Exercises General Exercises - Upper Extremity Shoulder Flexion: AROM;Both;10 reps;Supine Elbow Flexion:  AROM;Both;10 reps;Supine Digit Composite Flexion: AROM;Both;10 reps;Supine General Exercises - Lower Extremity Ankle Circles/Pumps: AAROM;Both;15 reps;Supine Heel Slides: AAROM;Both;10 reps;Supine Hip ABduction/ADduction: AAROM;Both;10 reps;Supine Straight Leg Raises: AAROM;Both;10 reps;Supine    General Comments        Pertinent Vitals/Pain Pain Assessment:  (Pt stated she just received some pain medication. )    Home Living                      Prior Function            PT Goals (current goals can now be found in the care plan section) Acute Rehab PT Goals Patient Stated Goal: To go home.  PT Goal Formulation: With patient/family Time For Goal Achievement: 04/09/17 Potential to Achieve Goals: Fair Progress towards PT goals: Progressing toward goals    Frequency    Min 3X/week      PT Plan Current plan remains appropriate    Co-evaluation  End of Session     Patient left: in bed;with call bell/phone within reach;with family/visitor present Nurse Communication: Mobility status PT Visit Diagnosis: Unsteadiness on feet (R26.81);Muscle weakness (generalized) (M62.81)     Time: 7793-9688 PT Time Calculation (min) (ACUTE ONLY): 24 min  Charges:  $Therapeutic Exercise: 23-37 mins                    G Codes:       Beth Iann Rodier, PT, DPT X: [phone]

## 2017-04-03 NOTE — Clinical Social Work Note (Signed)
Clinical Social Work Assessment  Patient Details  Name: SHAANA ACOCELLA MRN: [id number] Date of Birth: 07/06/1961  Date of referral:  04/03/17               Reason for consult:  Facility Placement, [employer], Discharge Planning                Permission sought to share information with:  Tourist information centre manager, Customer service manager, Family Supports Permission granted to share information::  Yes, Verbal Permission Granted  Name::        Agency::  [facility]  Relationship::  Fritz Pickerel, brother    Contact Information:     Housing/Transportation Living arrangements for the past 2 months:  [place] of Information:  Patient, Medical Team, Case Manager, Facility, Other (Comment Required) (other family: brother) Patient Interpreter Needed:  None Criminal Activity/Legal Involvement Pertinent to Current Situation/Hospitalization:  No - Comment as needed Significant Relationships:  Adult Children, Other Family Members, Siblings Lives with:  Siblings Do you feel safe going back to the place where you live?  No Need for family participation in patient care:  No (Coment)  Care giving concerns:  LCSW spoke with patient via phone as LCSW working remotely. Discussed care at home in which patient reports she lives with her sister who provides assistance with daily tasks and transportation.  Brother is also involved but reports his wife just had surgery and he is unable to provide care needed at this time.  Patient reports she is agreeable to SNF for wound care and receiving PT to get stronger in effort to stabilize for surgery in the future.    Social Worker assessment / plan:  LCSW completed consult. Plan: SNF at discharge  Patient is interested in [facility] Will follow up with bed offers.  Employment status:  Disabled (Comment on whether or not currently receiving Disability) Insurance information:  Medicare, Medicaid In [place] PT Recommendations:   [place] / Referral to community resources:  [place]  Patient/Family's Response to care:  Agreeable to SNF, as long as it is less than 30 days  Patient/Family's Understanding of and Emotional Response to Diagnosis, Current Treatment, and Prognosis:  Patient voices understanding regarding need to get stronger prior to surgery and reason surgery was cancelled.  She is hopeful to improve health and care at [facility].  Emotional Assessment Appearance:  Appears stated age Attitude/Demeanor/Rapport:    Affect (typically observed):  Accepting, Adaptable, Pleasant Orientation:  Oriented to Self, Oriented to Place, Oriented to  Time, Oriented to Situation Alcohol / Substance use:  Not Applicable Psych involvement (Current and /or in the community):  No (Comment)  Discharge Needs  Concerns to be addressed:  No discharge needs identified Readmission within the last 30 days:  Yes Current discharge risk:  None Barriers to Discharge:  No Barriers Identified, Continued Medical Work up   Lilly [place], LCSW 04/03/2017, 3:16 PM

## 2017-04-03 NOTE — Consult Note (Signed)
Kari [name] MRN: [id number] DOB/AGE: [birth year] 56 y.o. Primary Care Physician:FANTA,TESFAYE, MD Admit date: 04/01/2017 Chief Complaint:  Chief Complaint  Patient presents with  . Fall   HPI: Patient is a 56 year old female with past medical hx of  with DM type 2 who came to ER with c/o weakness.  HPI dates back to past few  days ago when pt started to feel weak, progressive, generalized. Pt had  No headache/ slurred speech/ acute focal weakness or numbness. No facial droop.  No loss of bowel or bladder control.  Pt  also has lower extremity ulcers that have been worsening. Ulcer had started about 3-4 years ago, but worsening over the last few months.  Pt admitted for cellulitis and started on IV abx Pt seen on 3rd floor, pt says " I am feeling better"    Past Medical History:  Diagnosis Date  . Asthma   . CHF (congestive heart failure) ([place]) 03/12/2014  . Depression   . Headache(784.0)   . Hyperlipidemia   . Hypertension   . Lung nodules   . Renal disorder   . Sarcoidosis ([place])   . Ulcer ([place])    left posterior calf        Family History  Problem Relation Age of Onset  . Heart failure Mother   . Diabetes Mellitus II Mother   . Hypertension Mother   . Cancer Mother     unknown type  . Heart disease Father   . Stroke Father   . Diabetes Mellitus II Father     Social History:  reports that she has never smoked. She has never used smokeless tobacco. She reports that she does not drink alcohol or use drugs.   Allergies: No Known Allergies  Medications Prior to Admission  Medication Sig Dispense Refill  . albuterol (PROVENTIL HFA;VENTOLIN HFA) 108 (90 BASE) MCG/ACT inhaler Inhale 2 puffs into the lungs every 6 (six) hours as needed for shortness of breath.    Marland Kitchen aspirin EC 81 MG tablet Take 81 mg by mouth daily.    . furosemide (LASIX) 40 MG tablet Take 40 mg by mouth daily.    . potassium chloride SA (K-DUR,KLOR-CON) 20 MEQ tablet Take 1 tablet (20 mEq  total) by mouth daily. 7 tablet 0  . SANTYL ointment Apply 1 application topically daily.     . traMADol (ULTRAM) 50 MG tablet Take 50 mg by mouth every 6 (six) hours as needed for moderate pain or severe pain.         JKK:XFGHW from the symptoms mentioned above,there are no other symptoms referable to all systems reviewed.  Marland Kitchen aspirin EC  81 mg Oral Daily  . Chlorhexidine Gluconate Cloth  6 each Topical Q0600  . feeding supplement (PRO-STAT SUGAR FREE 64)  60 mL Oral BID BM  . ferumoxytol  510 mg Intravenous Once  . multivitamin with minerals  1 tablet Oral Daily  . piperacillin-tazobactam (ZOSYN)  IV  3.375 g Intravenous Q8H  . potassium chloride  40 mEq Oral TID  . sodium hypochlorite   Irrigation BID  . vancomycin  1,000 mg Intravenous Q12H     Physical Exam: Vital signs in last 24 hours: Temp:  [98 F (36.7 C)-100.3 F (37.9 C)] 99.4 F (37.4 C) (04/11 0521) Pulse Rate:  [77-91] 79 (04/11 0521) Resp:  [18-20] 20 (04/11 0521) BP: (87-106)/(34-61) 94/51 (04/11 0521) SpO2:  [97 %-100 %] 100 % (04/11 0521) Weight change:  Last BM Date: 04/01/17  Intake/Output from previous day: 04/10 0701 - 04/11 0700 In: 4275.4 [P.O.:830; I.V.:1473.8; Blood:621.7; IV [id number]] Out: -  No intake/output data recorded.   Physical Exam: General- pt is awake,alert, oriented to time place and person Resp- No acute REsp distress, CTA B/L NO Rhonchi CVS- S1S2 regular in rate and rhythm GIT- BS+, soft, NT, ND EXT- NO LE Edema, no Cyanosis, bandages in situ, chronic venous stasis changes present  CNS- CN 2-12 grossly intact. Moving all 4 extremities Psych- normal mood and affect    Lab Results: CBC  Recent Labs  04/02/17 0604 04/03/17 0541  WBC 10.2 7.7  HGB 8.7* 8.7*  HCT 26.5* 26.7*  PLT 262 243    BMET  Recent Labs  04/02/17 0604 04/03/17 0541  NA 137 139  K 2.7* 2.7*  CL 109 110  CO2 21* 22  GLUCOSE 113* 96  BUN 25* 22*  CREATININE 1.96* 1.74*  CALCIUM  8.3* 8.3*   Creat trend 2018  2.2=>1.96=>1.74 2017  1.7--2.6 2014  6.24=>1.91 ( AKI)   MICRO Recent Results (from the past 240 hour(s))  Blood Culture (routine x 2)     Status: None (Preliminary result)   Collection Time: 04/01/17  9:47 PM  Result Value Ref Range Status   Specimen Description LEFT ANTECUBITAL  Final   Special Requests   Final    BOTTLES DRAWN AEROBIC ONLY Blood Culture results may not be optimal due to an inadequate volume of blood received in culture bottles   Culture NO GROWTH < 12 HOURS  Final   Report Status PENDING  Incomplete  Blood Culture (routine x 2)     Status: None (Preliminary result)   Collection Time: 04/01/17  9:52 PM  Result Value Ref Range Status   Specimen Description BLOOD LEFT WRIST  Final   Special Requests   Final    BOTTLES DRAWN AEROBIC ONLY Blood Culture results may not be optimal due to an inadequate volume of blood received in culture bottles   Culture NO GROWTH < 12 HOURS  Final   Report Status PENDING  Incomplete  Surgical PCR screen     Status: None   Collection Time: 04/02/17  4:00 PM  Result Value Ref Range Status   MRSA, PCR NEGATIVE NEGATIVE Final   Staphylococcus aureus NEGATIVE NEGATIVE Final    Comment:        The Xpert SA Assay (FDA approved for NASAL specimens in patients over 14 years of age), is one component of a comprehensive surveillance program.  Test performance has been validated by [facility] for patients greater than or equal to 73 year old. It is not intended to diagnose infection nor to guide or monitor treatment.       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 Phosphorus not  at goal.   5)ID-admitted with Cellulitis. On broad spectrum abx-Zosyn + vanco Primary MD following  6)Electrolytes  Hypokalemic  NOrmonatremic    7)Acid base Co2 now at goal     Plan:  Agree with current tx and plan Agree with  IVF at gentle rate Agree with adding KCL to IVF Will suggest to follow vanco levels       Charmon Thorson S 04/03/2017, 10:44 AM

## 2017-04-03 NOTE — Progress Notes (Signed)
CRITICAL VALUE ALERT  Critical value received: K 2.7  Date of notification:  04/03/2017  Time of notification: 0718  Critical value read back:yes  Nurse who received alert:  RN Eulas Post  MD notified (1st page):  Dr. Legrand Rams  Time of first page:  0720  MD notified (2nd page):N/A  Time of second page:N/A  Responding MD:  N/A  Time MD responded:  N/A

## 2017-04-03 NOTE — Care Management Note (Addendum)
Case Management Note  Patient Details  Name: Kari [name] MRN: [id number] Date of Birth: February 20, 1961  Subjective/Objective:                  Pt from home, lives alone and has daughter who is her aid. She has services 2-3 hrs per day M-F. Pt is active with [facility]. Anda Latina, aware of admission. Pt reports they come 2 times a week PTA but pt would like for them to come three times a week after DC. She initially planned to return home with  [facility] [place] services. PT has seen pt and recommends SNF. CSW consult placed for placement. Pt has stated she needs WC and 3 in 1. Pt has chosen [facility] from list of DME providers.   Action/Plan: Romualdo Bolk, of [facility], aware of referral and will obtain pt info form chart and deliver DME to pt room prior to DC. CM will cont to follow and will arrange for return home with [facility] if pt does not pursue SNF placement. Pt also eligible for [facility] and will be referred for Emmi transition calls if discharging home.   Expected Discharge Date:     04/07/2017             Expected Discharge Plan:  Skilled Nursing Facility  In-House Referral:  Clinical Social Work  Discharge planning Services  CM Consult  Post Acute Care Choice:  Durable Medical Equipment Choice offered to:  Patient  DME Arranged:  3-N-1, Programmer, multimedia DME Agency:  [facility].  Status of Service:  In process, will continue to follow   Sherald Barge, RN 04/03/2017, 1:36 PM

## 2017-04-03 NOTE — Progress Notes (Signed)
Bilateral lower extremity dressings changed per orders by WOC.  Very painful for patient - PRN medication given.

## 2017-04-04 LAB — URINE CULTURE

## 2017-04-04 LAB — CBC
HCT: 28.6 % — ABNORMAL LOW (ref 36.0–46.0)
Hemoglobin: 9.3 g/dL — ABNORMAL LOW (ref 12.0–15.0)
MCH: 29 pg (ref 26.0–34.0)
MCHC: 32.5 g/dL (ref 30.0–36.0)
MCV: 89.1 fL (ref 78.0–100.0)
Platelets: 253 10*3/uL (ref 150–400)
RBC: 3.21 MIL/uL — ABNORMAL LOW (ref 3.87–5.11)
RDW: 17.2 % — ABNORMAL HIGH (ref 11.5–15.5)
WBC: 8.9 10*3/uL (ref 4.0–10.5)

## 2017-04-04 LAB — BASIC METABOLIC PANEL
Anion gap: 8 (ref 5–15)
BUN: 19 mg/dL (ref 6–20)
CO2: 20 mmol/L — ABNORMAL LOW (ref 22–32)
Calcium: 8.4 mg/dL — ABNORMAL LOW (ref 8.9–10.3)
Chloride: 110 mmol/L (ref 101–111)
Creatinine, Ser: 1.57 mg/dL — ABNORMAL HIGH (ref 0.44–1.00)
GFR calc Af Amer: 42 mL/min — ABNORMAL LOW (ref >60)
GFR calc non Af Amer: 36 mL/min — ABNORMAL LOW (ref >60)
Glucose, Bld: 116 mg/dL — ABNORMAL HIGH (ref 65–99)
Potassium: 3.6 mmol/L (ref 3.5–5.1)
Sodium: 138 mmol/L (ref 135–145)

## 2017-04-04 LAB — MAGNESIUM: Magnesium: 2.2 mg/dL (ref 1.7–2.4)

## 2017-04-04 NOTE — Progress Notes (Signed)
Changed dressings to pt's legs per order, pt tolerated well.

## 2017-04-04 NOTE — Progress Notes (Signed)
Pharmacy Antibiotic Note  Kari [name] is a 56 y.o. female admitted on 04/01/2017 with cellulitis / possible bacteremia.  Pharmacy has been consulted for zosyn dosing. Patient improving, plans to transition to po abx tomorrow. Renal function improving, patient feeling better.  Plan: Continue Zosyn 3.375 gm IV q8 hours f/u renal function, cultures / sensitivities and clinical course  Height: 5\' 4"  (162.6 cm) Weight: 288 lb 1.6 oz (130.7 kg) IBW/kg (Calculated) : 54.7  Temp (24hrs), Avg:99.1 F (37.3 C), Min:98.8 F (37.1 C), Max:99.5 F (37.5 C)   Recent Labs Lab 04/01/17 2145 04/02/17 0035 04/02/17 0604 04/03/17 0541 04/03/17 2355  WBC 12.7*  --  10.2 7.7 8.9  CREATININE 2.25*  --  1.96* 1.74* 1.57*  LATICACIDVEN 1.8 1.2  --   --   --     Estimated Creatinine Clearance: 54.4 mL/min (A) (by C-G formula based on SCr of 1.57 mg/dL (H)).    No Known Allergies  Antimicrobials this admission: vanc 4/9 >> 4/12 zosyn 4/9 >>   Microbiology: 4/9 BCX: ngtd 4/10 MRSA PCR is negative 4/10 UCx; multiple species. NTR  Thank you for allowing pharmacy to be a part of this patient's care.  Cristy Friedlander 04/04/2017 1:08 PM

## 2017-04-04 NOTE — Progress Notes (Signed)
Subjective: Interval History: has no complaint of nausea or vomiting. Patient states that she is feeling much better. Denies any difficulty in breathing..  Objective: Vital signs in last 24 hours: Temp:  [98.8 F (37.1 C)-99.5 F (37.5 C)] 98.8 F (37.1 C) (04/12 0525) Pulse Rate:  [77-88] 77 (04/12 0525) Resp:  [18] 18 (04/12 0525) BP: (90-121)/(44-65) 103/54 (04/12 0525) SpO2:  [97 %-99 %] 97 % (04/12 0525) Weight change:   Intake/Output from previous day: 04/11 0701 - 04/12 0700 In: 2205 [P.O.:480; I.V.:1225; IV Piggyback:500] Out: -  Intake/Output this shift: Total I/O In: 240 [P.O.:240] Out: -   General appearance: alert, cooperative and no distress Resp: clear to auscultation bilaterally Cardio: regular rate and rhythm GI: Abdomen is distended, nontender and no rebound tenderness Extremities: She has trace to 1+ edema she has chronic changes on her leg and has some dressing.  Lab Results:  Recent Labs  04/03/17 0541 04/03/17 2355  WBC 7.7 8.9  HGB 8.7* 9.3*  HCT 26.7* 28.6*  PLT 243 253   BMET:  Recent Labs  04/03/17 0541 04/03/17 2355  NA 139 138  K 2.7* 3.6  CL 110 110  CO2 22 20*  GLUCOSE 96 116*  BUN 22* 19  CREATININE 1.74* 1.57*  CALCIUM 8.3* 8.4*   No results for input(s): PTH in the last 72 hours. Iron Studies: No results for input(s): IRON, TIBC, TRANSFERRIN, FERRITIN in the last 72 hours.  Studies/Results: No results found.  I have reviewed the patient's current medications.  Assessment/Plan: Problem #1 acute kidney injury superimposed on chronic. Presently her BUN and creatinine has improved and returned to her baseline. Problem #2 chronic renal failure: Possibly stage III . Patient has previous history of acute kidney injury. Etiology could be secondary to diabetes and also recurrent acute kidney injury. Problem #3 hypokalemia: Her potassium is normal   problem #4 anemia: Her hemoglobin is low. Possibly iron deficiency anemia but  anemia of chronic disease cannot be ruled out. Problem #5 hypertension: Her blood pressure is reasonably controlled Problem #6 history of sarcoidosis Problem #7 history of left ovarian mass Plan: 1] We'll continue his present management 2] we'll check CBC and renal panel in the morning 3] we'll check iron studies in the morning.   LOS: 2 days   Kari [name] S 04/04/2017,9:31 AM

## 2017-04-04 NOTE — NC FL2 (Signed)
[place] MEDICAID FL2 LEVEL OF CARE SCREENING TOOL     IDENTIFICATION  Patient Name: Kari [name] Birthdate: December 23, 1961 Sex: female Admission Date (Current Location): 04/01/2017  [facility] and [place] Number:  [employer] and Address:  [place] [address], [place]      Provider Number: [id number]  Attending Physician Name and Address:  Rosita Fire, MD  Relative Name and Phone Number:       Current Level of Care: Hospital Recommended Level of Care: [place] Prior Approval Number:    Date Approved/Denied:   PASRR Number:    Discharge Plan: SNF    Current Diagnoses: Patient Active Problem List   Diagnosis Date Noted  . Cellulitis 04/02/2017  . AKI (acute kidney injury) ([name]) 12/17/2016  . Cellulitis and abscess of right lower extremity 12/17/2016  . Syncope and collapse 12/17/2016  . Peripheral edema 12/17/2016  . Hypokalemia 12/17/2016  . Anemia 12/17/2016  . Acute diastolic CHF (congestive heart failure) ([place]) 03/14/2014  . CHF (congestive heart failure) ([place]) 03/12/2014  . Sarcoidosis ([place]) 03/12/2014  . Severe sepsis ([place]) 10/17/2013  . ARF (acute renal failure) ([place]) 10/08/2013  . Cellulitis and abscess 10/08/2013  . Obesity 10/08/2013  . Volume depletion 10/08/2013  . Unspecified venous (peripheral) insufficiency 10/28/2012  . Mediastinal lymphadenopathy 10/16/2012  . Pulmonary nodules 10/16/2012  . 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 Chest pain 09/02/2012  . Asthma   . Hypertension   . Depression   . Venous stasis ulcers ([place]) 07/29/2012  . Varicose veins of lower extremities with ulcer ([place]) 07/08/2012  . Venous ulcer of leg ([place]) 07/08/2012    Orientation RESPIRATION BLADDER Height & Weight     Self, Place  Normal Continent Weight: 288 lb 1.6 oz (130.7 kg) Height:  5\' 4"  (162.6 cm)  BEHAVIORAL SYMPTOMS/MOOD NEUROLOGICAL BOWEL  NUTRITION STATUS      Continent Diet (See DC summary)  AMBULATORY STATUS COMMUNICATION OF NEEDS Skin   Extensive Assist Verbally Other (Comment)                       Personal Care Assistance Level of Assistance  Bathing, Feeding, Dressing Bathing Assistance: Limited assistance Feeding assistance: Independent Dressing Assistance: Limited assistance     Functional Limitations Info  Sight, Hearing, Speech Sight Info: Adequate Hearing Info: Adequate Speech Info: Adequate    SPECIAL CARE FACTORS FREQUENCY  PT (By licensed PT), OT (By licensed OT)     PT Frequency: 5x OT Frequency: 5x            Contractures Contractures Info: Not present    Additional Factors Info  Code Status, Allergies Code Status Info: Full Code Allergies Info: NKA           Current Medications (04/04/2017):  This is the current hospital active medication list Current Facility-Administered Medications  Medication Dose Route Frequency Provider Last Rate Last Dose  . acetaminophen (TYLENOL) tablet 650 mg  650 mg Oral Q6H PRN Oswald Hillock, MD       Or  . acetaminophen (TYLENOL) suppository 650 mg  650 mg Rectal Q6H PRN Oswald Hillock, MD      . aspirin EC tablet 81 mg  81 mg Oral Daily Oswald Hillock, MD   81 mg at 04/04/17 0959  . Chlorhexidine Gluconate Cloth 2 % PADS 6 each  6 each Topical Q0600 Florian Buff, MD  6 each at 04/03/17 0631  . feeding supplement (PRO-STAT SUGAR FREE 64) liquid 60 mL  60 mL Oral BID BM Rosita Fire, MD   60 mL at 04/03/17 1039  . HYDROcodone-acetaminophen (NORCO/VICODIN) 5-325 MG per tablet 1-2 tablet  1-2 tablet Oral Q6H PRN Rosita Fire, MD   2 tablet at 04/04/17 0959  . multivitamin with minerals tablet 1 tablet  1 tablet Oral Daily Rosita Fire, MD   1 tablet at 04/04/17 0959  . ondansetron (ZOFRAN) tablet 4 mg  4 mg Oral Q6H PRN Oswald Hillock, MD       Or  . ondansetron (ZOFRAN) injection 4 mg  4 mg Intravenous Q6H PRN Oswald Hillock, MD      .  piperacillin-tazobactam (ZOSYN) IVPB 3.375 g  3.375 g Intravenous Q8H Dorie Rank, MD   3.375 g at 04/04/17 0514  . potassium chloride SA (K-DUR,KLOR-CON) CR tablet 40 mEq  40 mEq Oral TID Rosita Fire, MD   40 mEq at 04/04/17 0959  . sodium chloride 0.45 % 1,000 mL with potassium chloride 40 mEq infusion   Intravenous Continuous Rosita Fire, MD 75 mL/hr at 04/04/17 0449    . sodium hypochlorite (DAKIN'S 1/4 STRENGTH) topical solution   Irrigation BID Rosita Fire, MD         Discharge Medications: Please see discharge summary for a list of discharge medications.  Relevant Imaging Results:  Relevant Lab Results:   Additional Information SSN:  [id number]  Ihor Gully, LCSW

## 2017-04-04 NOTE — Progress Notes (Signed)
Subjective: Patient feels better. She is receiving IV antibiotics. No fever or chills. Cultures are negative so far. Her K+ has been corrected and herr renal function is improving.  Objective: Vital signs in last 24 hours: Temp:  [98.8 F (37.1 C)-99.5 F (37.5 C)] 98.8 F (37.1 C) (04/12 0525) Pulse Rate:  [77-88] 77 (04/12 0525) Resp:  [18] 18 (04/12 0525) BP: (90-121)/(44-65) 103/54 (04/12 0525) SpO2:  [97 %-99 %] 97 % (04/12 0525) Weight change:  Last BM Date: 04/03/17  Intake/Output from previous day: 04/11 0701 - 04/12 0700 In: 2205 [P.O.:480; I.V.:1225; IV Piggyback:500] Out: -   PHYSICAL EXAM General appearance: fatigued and no distress Resp: clear to auscultation bilaterally Cardio: S1, S2 normal GI: soft, non-tender; bowel sounds normal; no masses,  no organomegaly Extremities: venous stasis dermatitis noted and bilateral lower extremities ulcerations with bad odor  Lab Results:  Results for orders placed or performed during the hospital encounter of 04/01/17 (from the past 48 hour(s))  Type and screen [facility]     Status: None (Preliminary result)   Collection Time: 04/02/17 11:23 AM  Result Value Ref Range   ABO/RH(D) O POS    Antibody Screen NEG    Sample Expiration 04/05/2017    Unit Number [id number]    Blood Component Type RED CELLS,LR    Unit division 00    Status of Unit ISSUED,FINAL    Transfusion Status OK TO TRANSFUSE    Crossmatch Result Compatible    Unit Number [id number]    Blood Component Type RED CELLS,LR    Unit division 00    Status of Unit ALLOCATED    Transfusion Status OK TO TRANSFUSE    Crossmatch Result Compatible    Unit Number [id number]    Blood Component Type RBC LR PHER2    Unit division 00    Status of Unit ALLOCATED    Transfusion Status OK TO TRANSFUSE    Crossmatch Result Compatible   Prepare RBC     Status: None   Collection Time: 04/02/17 11:23 AM  Result Value Ref Range   Order Confirmation  ORDER PROCESSED BY BLOOD BANK   ABO/Rh     Status: None   Collection Time: 04/02/17 11:23 AM  Result Value Ref Range   ABO/RH(D) O POS   Prepare RBC (crossmatch)     Status: None   Collection Time: 04/02/17 12:30 PM  Result Value Ref Range   Order Confirmation ORDER PROCESSED BY BLOOD BANK   Surgical PCR screen     Status: None   Collection Time: 04/02/17  4:00 PM  Result Value Ref Range   MRSA, PCR NEGATIVE NEGATIVE   Staphylococcus aureus NEGATIVE NEGATIVE    Comment:        The Xpert SA Assay (FDA approved for NASAL specimens in patients over 69 years of age), is one component of a comprehensive surveillance program.  Test performance has been validated by [facility] for patients greater than or equal to 58 year old. It is not intended to diagnose infection nor to guide or monitor treatment.   Basic metabolic panel     Status: Abnormal   Collection Time: 04/03/17  5:41 AM  Result Value Ref Range   Sodium 139 135 - 145 mmol/L   Potassium 2.7 (LL) 3.5 - 5.1 mmol/L    Comment: CRITICAL RESULT CALLED TO, READ BACK BY AND VERIFIED WITH: GLENN,T AT 7:20AM ON 04/03/17 BY FESTERMAN,C    Chloride 110 101 - 111 mmol/L  CO2 22 22 - 32 mmol/L   Glucose, Bld 96 65 - 99 mg/dL   BUN 22 (H) 6 - 20 mg/dL   Creatinine, Ser 1.74 (H) 0.44 - 1.00 mg/dL   Calcium 8.3 (L) 8.9 - 10.3 mg/dL   GFR calc non Af Amer 32 (L) >60 mL/min   GFR calc Af Amer 37 (L) >60 mL/min    Comment: (NOTE) The eGFR has been calculated using the CKD EPI equation. This calculation has not been validated in all clinical situations. eGFR's persistently <60 mL/min signify possible Chronic Kidney Disease.    Anion gap 7 5 - 15  CBC     Status: Abnormal   Collection Time: 04/03/17  5:41 AM  Result Value Ref Range   WBC 7.7 4.0 - 10.5 K/uL   RBC 3.02 (L) 3.87 - 5.11 MIL/uL   Hemoglobin 8.7 (L) 12.0 - 15.0 g/dL   HCT 26.7 (L) 36.0 - 46.0 %   MCV 88.4 78.0 - 100.0 fL   MCH 28.8 26.0 - 34.0 pg   MCHC 32.6  30.0 - 36.0 g/dL   RDW 16.7 (H) 11.5 - 15.5 %   Platelets 243 150 - 400 K/uL  CBC     Status: Abnormal   Collection Time: 04/03/17 11:55 PM  Result Value Ref Range   WBC 8.9 4.0 - 10.5 K/uL   RBC 3.21 (L) 3.87 - 5.11 MIL/uL   Hemoglobin 9.3 (L) 12.0 - 15.0 g/dL   HCT 28.6 (L) 36.0 - 46.0 %   MCV 89.1 78.0 - 100.0 fL   MCH 29.0 26.0 - 34.0 pg   MCHC 32.5 30.0 - 36.0 g/dL   RDW 17.2 (H) 11.5 - 15.5 %   Platelets 253 150 - 400 K/uL  Basic metabolic panel     Status: Abnormal   Collection Time: 04/03/17 11:55 PM  Result Value Ref Range   Sodium 138 135 - 145 mmol/L   Potassium 3.6 3.5 - 5.1 mmol/L    Comment: DELTA CHECK NOTED   Chloride 110 101 - 111 mmol/L   CO2 20 (L) 22 - 32 mmol/L   Glucose, Bld 116 (H) 65 - 99 mg/dL   BUN 19 6 - 20 mg/dL   Creatinine, Ser 1.57 (H) 0.44 - 1.00 mg/dL   Calcium 8.4 (L) 8.9 - 10.3 mg/dL   GFR calc non Af Amer 36 (L) >60 mL/min   GFR calc Af Amer 42 (L) >60 mL/min    Comment: (NOTE) The eGFR has been calculated using the CKD EPI equation. This calculation has not been validated in all clinical situations. eGFR's persistently <60 mL/min signify possible Chronic Kidney Disease.    Anion gap 8 5 - 15    ABGS No results for input(s): PHART, PO2ART, TCO2, HCO3 in the last 72 hours.  Invalid input(s): PCO2 CULTURES Recent Results (from the past 240 hour(s))  Blood Culture (routine x 2)     Status: None (Preliminary result)   Collection Time: 04/01/17  9:47 PM  Result Value Ref Range Status   Specimen Description LEFT ANTECUBITAL  Final   Special Requests   Final    BOTTLES DRAWN AEROBIC ONLY Blood Culture results may not be optimal due to an inadequate volume of blood received in culture bottles   Culture NO GROWTH 2 DAYS  Final   Report Status PENDING  Incomplete  Blood Culture (routine x 2)     Status: None (Preliminary result)   Collection Time: 04/01/17  9:52 PM  Result Value Ref Range Status   Specimen Description BLOOD LEFT WRIST   Final   Special Requests   Final    BOTTLES DRAWN AEROBIC ONLY Blood Culture results may not be optimal due to an inadequate volume of blood received in culture bottles   Culture NO GROWTH 2 DAYS  Final   Report Status PENDING  Incomplete  Surgical PCR screen     Status: None   Collection Time: 04/02/17  4:00 PM  Result Value Ref Range Status   MRSA, PCR NEGATIVE NEGATIVE Final   Staphylococcus aureus NEGATIVE NEGATIVE Final    Comment:        The Xpert SA Assay (FDA approved for NASAL specimens in patients over 22 years of age), is one component of a comprehensive surveillance program.  Test performance has been validated by [facility] for patients greater than or equal to 69 year old. It is not intended to diagnose infection nor to guide or monitor treatment.    Studies/Results: No results found.  Medications: I have reviewed the patient's current medications.  Assesment:  Active Problems:   Venous stasis ulcers (HCC)   Volume depletion   AKI (acute kidney injury) ([place])   Hypokalemia   Cellulitis ovarian mass fall   Plan:  Medications reviewed Continue combination IV antibiotics Continue wound care Nephrology consult appreciated D/C vancomycin Continue Iv hydration If patient continue to improve will discharge her home tomorrow on oral antibiotics      LOS: 2 days   Indigo Barbian 04/04/2017, 8:10 AM

## 2017-04-05 LAB — CBC
HCT: 30.2 % — ABNORMAL LOW (ref 36.0–46.0)
Hemoglobin: 9.6 g/dL — ABNORMAL LOW (ref 12.0–15.0)
MCH: 29 pg (ref 26.0–34.0)
MCHC: 31.8 g/dL (ref 30.0–36.0)
MCV: 91.2 fL (ref 78.0–100.0)
Platelets: 235 10*3/uL (ref 150–400)
RBC: 3.31 MIL/uL — ABNORMAL LOW (ref 3.87–5.11)
RDW: 17.5 % — ABNORMAL HIGH (ref 11.5–15.5)
WBC: 7.9 10*3/uL (ref 4.0–10.5)

## 2017-04-05 LAB — IRON AND TIBC
Iron: 128 ug/dL (ref 28–170)
Saturation Ratios: 73 % — ABNORMAL HIGH (ref 10.4–31.8)
TIBC: 175 ug/dL — ABNORMAL LOW (ref 250–450)
UIBC: 47 ug/dL

## 2017-04-05 LAB — RENAL FUNCTION PANEL
Albumin: 2.1 g/dL — ABNORMAL LOW (ref 3.5–5.0)
Anion gap: 4 — ABNORMAL LOW (ref 5–15)
BUN: 17 mg/dL (ref 6–20)
CO2: 20 mmol/L — ABNORMAL LOW (ref 22–32)
Calcium: 8.7 mg/dL — ABNORMAL LOW (ref 8.9–10.3)
Chloride: 111 mmol/L (ref 101–111)
Creatinine, Ser: 1.35 mg/dL — ABNORMAL HIGH (ref 0.44–1.00)
GFR calc Af Amer: 50 mL/min — ABNORMAL LOW (ref >60)
GFR calc non Af Amer: 43 mL/min — ABNORMAL LOW (ref >60)
Glucose, Bld: 92 mg/dL (ref 65–99)
Phosphorus: 2.3 mg/dL — ABNORMAL LOW (ref 2.5–4.6)
Potassium: 4.7 mmol/L (ref 3.5–5.1)
Sodium: 135 mmol/L (ref 135–145)

## 2017-04-05 LAB — FERRITIN: Ferritin: 161 ng/mL (ref 11–307)

## 2017-04-05 NOTE — Progress Notes (Signed)
Physical Therapy Treatment Patient Details Name: Kari [name] MRN: [id number] DOB: Nov 18, 1961 Today's Date: 04/05/2017    History of Present Illness 56 year old black female multiple medical problems including CHF, sarcoidosis, renal insufficiency, hypertension, and history of chronic stasis disease of lower extremities. She has been receiving treatment in the past including Unna boots. She states they have healed in the past, but recently have over the past few months become worse. She was able to ambulate, though ambulation and decreased because of this. There is some pain with palpation of the wounds noted.  Dr. Elonda Husky of gynecology will be operating on the patient tomorrow for a mass    PT Comments    Pt received in bed, and was agreeable to PT tx. Bed exercises performed today due to increased pain.  Pt demonstrates improved mobility of B LE's, and was able to demonstrate exercises well.  Handout left in pt's room, and encouraged pt to perform exercises 3x's/day.  Continue to recommend SNF.    Follow Up Recommendations  SNF     Equipment Recommendations  None recommended by PT    Recommendations for Other Services       Precautions / Restrictions Precautions Precautions: Fall Restrictions Weight Bearing Restrictions: No    Mobility  Bed Mobility                  Transfers                    Ambulation/Gait                 Stairs            Wheelchair Mobility    Modified Rankin (Stroke Patients Only)       Balance                                            Cognition Arousal/Alertness: Awake/alert Behavior During Therapy: WFL for tasks assessed/performed Overall Cognitive Status: Within Functional Limits for tasks assessed                                        Exercises General Exercises - Lower Extremity Ankle Circles/Pumps: AROM;Both;20 reps;Supine Quad Sets: Strengthening;Both;10  reps;Supine Short Arc Quad: Strengthening;Both;10 reps;Supine Heel Slides: Strengthening;Both;10 reps;Supine Straight Leg Raises: Strengthening;Both;10 reps;Supine    General Comments        Pertinent Vitals/Pain Pain Score: 7  Pain Location: LE's Pain Intervention(s): Limited activity within patient's tolerance;Monitored during session;RN gave pain meds during session;Repositioned    Home Living                      Prior Function            PT Goals (current goals can now be found in the care plan section) Acute Rehab PT Goals Patient Stated Goal: To go home.  PT Goal Formulation: With patient/family Time For Goal Achievement: 04/09/17 Potential to Achieve Goals: Fair Progress towards PT goals: Progressing toward goals    Frequency           PT Plan Current plan remains appropriate    Co-evaluation             End of Session   Activity Tolerance: Patient tolerated treatment well Patient  left: in bed;with call bell/phone within reach;with family/visitor present Nurse Communication: Mobility status PT Visit Diagnosis: Unsteadiness on feet (R26.81);Muscle weakness (generalized) (M62.81)     Time: 0298-4730 PT Time Calculation (min) (ACUTE ONLY): 21 min  Charges:  $Therapeutic Exercise: 8-22 mins                    G Codes:       Beth Ferrell Flam, PT, DPT X: [phone]

## 2017-04-05 NOTE — Progress Notes (Signed)
Patient refusing dressing change stating her legs are too sore.

## 2017-04-05 NOTE — Progress Notes (Signed)
Subjective: Patient is resting. She is complaining of pain in her legs.. She is receiving IV antibiotics and wound care. She planned for discharge to nursing home  Objective: Vital signs in last 24 hours: Temp:  [98.2 F (36.8 C)-98.6 F (37 C)] 98.6 F (37 C) (04/13 0655) Pulse Rate:  [70-78] 76 (04/13 0655) Resp:  [16-18] 18 (04/13 0655) BP: (101-108)/(39-54) 105/39 (04/13 0655) SpO2:  [99 %-100 %] 99 % (04/13 0655) Weight:  [133.8 kg (294 lb 15.6 oz)] 133.8 kg (294 lb 15.6 oz) (04/13 0655) Weight change:  Last BM Date: 04/04/17  Intake/Output from previous day: 04/12 0701 - 04/13 0700 In: 2947 [P.O.:840; I.V.:900; IV Piggyback:50] Out: -   PHYSICAL EXAM General appearance: fatigued and no distress Resp: clear to auscultation bilaterally Cardio: S1, S2 normal GI: soft, non-tender; bowel sounds normal; no masses,  no organomegaly Extremities: venous stasis dermatitis noted and bilateral lower extremities ulcerations with bad odor  Lab Results:  Results for orders placed or performed during the hospital encounter of 04/01/17 (from the past 48 hour(s))  CBC     Status: Abnormal   Collection Time: 04/03/17 11:55 PM  Result Value Ref Range   WBC 8.9 4.0 - 10.5 K/uL   RBC 3.21 (L) 3.87 - 5.11 MIL/uL   Hemoglobin 9.3 (L) 12.0 - 15.0 g/dL   HCT 28.6 (L) 36.0 - 46.0 %   MCV 89.1 78.0 - 100.0 fL   MCH 29.0 26.0 - 34.0 pg   MCHC 32.5 30.0 - 36.0 g/dL   RDW 17.2 (H) 11.5 - 15.5 %   Platelets 253 150 - 400 K/uL  Basic metabolic panel     Status: Abnormal   Collection Time: 04/03/17 11:55 PM  Result Value Ref Range   Sodium 138 135 - 145 mmol/L   Potassium 3.6 3.5 - 5.1 mmol/L    Comment: DELTA CHECK NOTED   Chloride 110 101 - 111 mmol/L   CO2 20 (L) 22 - 32 mmol/L   Glucose, Bld 116 (H) 65 - 99 mg/dL   BUN 19 6 - 20 mg/dL   Creatinine, Ser 1.57 (H) 0.44 - 1.00 mg/dL   Calcium 8.4 (L) 8.9 - 10.3 mg/dL   GFR calc non Af Amer 36 (L) >60 mL/min   GFR calc Af Amer 42 (L) >60  mL/min    Comment: (NOTE) The eGFR has been calculated using the CKD EPI equation. This calculation has not been validated in all clinical situations. eGFR's persistently <60 mL/min signify possible Chronic Kidney Disease.    Anion gap 8 5 - 15  Magnesium     Status: None   Collection Time: 04/03/17 11:55 PM  Result Value Ref Range   Magnesium 2.2 1.7 - 2.4 mg/dL  CBC     Status: Abnormal   Collection Time: 04/05/17  6:22 AM  Result Value Ref Range   WBC 7.9 4.0 - 10.5 K/uL   RBC 3.31 (L) 3.87 - 5.11 MIL/uL   Hemoglobin 9.6 (L) 12.0 - 15.0 g/dL   HCT 30.2 (L) 36.0 - 46.0 %   MCV 91.2 78.0 - 100.0 fL   MCH 29.0 26.0 - 34.0 pg   MCHC 31.8 30.0 - 36.0 g/dL   RDW 17.5 (H) 11.5 - 15.5 %   Platelets 235 150 - 400 K/uL  Renal function panel     Status: Abnormal   Collection Time: 04/05/17  6:22 AM  Result Value Ref Range   Sodium 135 135 - 145 mmol/L  Potassium 4.7 3.5 - 5.1 mmol/L    Comment: DELTA CHECK NOTED   Chloride 111 101 - 111 mmol/L   CO2 20 (L) 22 - 32 mmol/L   Glucose, Bld 92 65 - 99 mg/dL   BUN 17 6 - 20 mg/dL   Creatinine, Ser 1.35 (H) 0.44 - 1.00 mg/dL   Calcium 8.7 (L) 8.9 - 10.3 mg/dL   Phosphorus 2.3 (L) 2.5 - 4.6 mg/dL   Albumin 2.1 (L) 3.5 - 5.0 g/dL   GFR calc non Af Amer 43 (L) >60 mL/min   GFR calc Af Amer 50 (L) >60 mL/min    Comment: (NOTE) The eGFR has been calculated using the CKD EPI equation. This calculation has not been validated in all clinical situations. eGFR's persistently <60 mL/min signify possible Chronic Kidney Disease.    Anion gap 4 (L) 5 - 15    ABGS No results for input(s): PHART, PO2ART, TCO2, HCO3 in the last 72 hours.  Invalid input(s): PCO2 CULTURES Recent Results (from the past 240 hour(s))  Blood Culture (routine x 2)     Status: None (Preliminary result)   Collection Time: 04/01/17  9:47 PM  Result Value Ref Range Status   Specimen Description LEFT ANTECUBITAL  Final   Special Requests   Final    BOTTLES DRAWN  AEROBIC ONLY Blood Culture results may not be optimal due to an inadequate volume of blood received in culture bottles   Culture NO GROWTH 3 DAYS  Final   Report Status PENDING  Incomplete  Blood Culture (routine x 2)     Status: None (Preliminary result)   Collection Time: 04/01/17  9:52 PM  Result Value Ref Range Status   Specimen Description BLOOD LEFT WRIST  Final   Special Requests   Final    BOTTLES DRAWN AEROBIC ONLY Blood Culture results may not be optimal due to an inadequate volume of blood received in culture bottles   Culture NO GROWTH 3 DAYS  Final   Report Status PENDING  Incomplete  Surgical PCR screen     Status: None   Collection Time: 04/02/17  4:00 PM  Result Value Ref Range Status   MRSA, PCR NEGATIVE NEGATIVE Final   Staphylococcus aureus NEGATIVE NEGATIVE Final    Comment:        The Xpert SA Assay (FDA approved for NASAL specimens in patients over 62 years of age), is one component of a comprehensive surveillance program.  Test performance has been validated by [facility] for patients greater than or equal to 44 year old. It is not intended to diagnose infection nor to guide or monitor treatment.   Urine culture     Status: Abnormal   Collection Time: 04/02/17  4:34 PM  Result Value Ref Range Status   Specimen Description URINE, CLEAN CATCH  Final   Special Requests NONE  Final   Culture MULTIPLE SPECIES PRESENT, SUGGEST RECOLLECTION (A)  Final   Report Status 04/04/2017 FINAL  Final   Studies/Results: No results found.  Medications: I have reviewed the patient's current medications.  Assesment:  Active Problems:   Venous stasis ulcers (HCC)   Volume depletion   AKI (acute kidney injury) ([place])   Hypokalemia   Cellulitis ovarian mass fall   Plan:  Medications reviewed Continue combination IV antibiotics Continue wound care contiinue IV Zosyn Patient is planned for placement in nursing home     LOS: 3 days    Kari [name],Kari [name] 04/05/2017, 8:05 AM

## 2017-04-05 NOTE — Progress Notes (Signed)
This RN attempted to complete dressing change. Patient stated that she was too tired and wants to sleep. Patient asked RN to change dressing later. Patient encouraged to call when ready for drsg. change.

## 2017-04-05 NOTE — Progress Notes (Signed)
Subjective: Interval History: Patient is feeling much better. Presently she denies any nausea or vomiting. She was somewhat weak.  Objective: Vital signs in last 24 hours: Temp:  [98.2 F (36.8 C)-98.6 F (37 C)] 98.6 F (37 C) (04/13 0655) Pulse Rate:  [70-78] 76 (04/13 0655) Resp:  [16-18] 18 (04/13 0655) BP: (101-108)/(39-54) 105/39 (04/13 0655) SpO2:  [99 %-100 %] 99 % (04/13 0655) Weight:  [133.8 kg (294 lb 15.6 oz)] 133.8 kg (294 lb 15.6 oz) (04/13 0655) Weight change:   Intake/Output from previous day: 04/12 0701 - 04/13 0700 In: 2878 [P.O.:840; I.V.:900; IV Piggyback:50] Out: -  Intake/Output this shift: Total I/O In: 25 [IV Piggyback:25] Out: -   General appearance: alert, cooperative and no distress Resp: clear to auscultation bilaterally Cardio: regular rate and rhythm GI: Abdomen is distended, nontender and no rebound tenderness Extremities: She has trace to 1+ edema she has chronic changes on her leg and has some dressing.  Lab Results:  Recent Labs  04/03/17 2355 04/05/17 0622  WBC 8.9 7.9  HGB 9.3* 9.6*  HCT 28.6* 30.2*  PLT 253 235   BMET:   Recent Labs  04/03/17 2355 04/05/17 0622  NA 138 135  K 3.6 4.7  CL 110 111  CO2 20* 20*  GLUCOSE 116* 92  BUN 19 17  CREATININE 1.57* 1.35*  CALCIUM 8.4* 8.7*   No results for input(s): PTH in the last 72 hours. Iron Studies: No results for input(s): IRON, TIBC, TRANSFERRIN, FERRITIN in the last 72 hours.  Studies/Results: No results found.  I have reviewed the patient's current medications.  Assessment/Plan: Problem #1 acute kidney injury superimposed on chronic. Her renal function continued to improve. Presently her creatinine has returned to her baseline.. Problem #2 chronic renal failure: Possibly stage III . Patient has previous history of acute kidney injury. Etiology could be secondary to diabetes and also recurrent acute kidney injury. Problem #3 hypokalemia: Her potassium is normal   problem #4 anemia: Her hemoglobin is low. Possibly iron deficiency anemia but anemia of chronic disease cannot be ruled out. Iron studies are pending Problem #5 hypertension: Her blood pressure is reasonably controlled Problem #6 history of sarcoidosis Problem #7 history of left ovarian mass Plan: 1] We'll DC IV fluid 2] we'll check CBC and renal panel in the morning    LOS: 3 days   Ryu Cerreta S 04/05/2017,8:54 AM

## 2017-04-05 NOTE — Progress Notes (Signed)
LCSW following for disposition of needs:  New SNF placement. Patient has bed offers:  [facility] and [place].  Bed accepted at [facility]. Not ready for discharge today, however [facility] is aware patient may be ready over the weekend and can accept.   Will continue to follow and assist with discharge.  Lane Hacker, MSW Clinical Social Work: [employer] Coverage for :  [phone]

## 2017-04-06 ENCOUNTER — Inpatient Hospital Stay
Admission: RE | Admit: 2017-04-06 | Discharge: 2017-04-26 | Disposition: A | Discharge status: Home / Self Care | Payer: Medicare Other | Source: Ambulatory Visit | Attending: Internal Medicine | Admitting: Internal Medicine

## 2017-04-06 DIAGNOSIS — L97202 Non-pressure chronic ulcer of unspecified calf with fat layer exposed: Secondary | ICD-10-CM | POA: Diagnosis not present

## 2017-04-06 DIAGNOSIS — R55 Syncope and collapse: Secondary | ICD-10-CM | POA: Diagnosis not present

## 2017-04-06 DIAGNOSIS — I83002 Varicose veins of unspecified lower extremity with ulcer of calf: Secondary | ICD-10-CM | POA: Diagnosis not present

## 2017-04-06 DIAGNOSIS — I872 Venous insufficiency (chronic) (peripheral): Secondary | ICD-10-CM | POA: Diagnosis not present

## 2017-04-06 DIAGNOSIS — I83019 Varicose veins of right lower extremity with ulcer of unspecified site: Secondary | ICD-10-CM | POA: Diagnosis not present

## 2017-04-06 DIAGNOSIS — N179 Acute kidney failure, unspecified: Secondary | ICD-10-CM | POA: Diagnosis not present

## 2017-04-06 DIAGNOSIS — L0390 Cellulitis, unspecified: Secondary | ICD-10-CM | POA: Diagnosis not present

## 2017-04-06 DIAGNOSIS — R600 Localized edema: Secondary | ICD-10-CM | POA: Diagnosis not present

## 2017-04-06 DIAGNOSIS — A419 Sepsis, unspecified organism: Secondary | ICD-10-CM | POA: Diagnosis not present

## 2017-04-06 DIAGNOSIS — I83029 Varicose veins of left lower extremity with ulcer of unspecified site: Secondary | ICD-10-CM | POA: Diagnosis not present

## 2017-04-06 DIAGNOSIS — L97919 Non-pressure chronic ulcer of unspecified part of right lower leg with unspecified severity: Secondary | ICD-10-CM | POA: Diagnosis not present

## 2017-04-06 DIAGNOSIS — J45909 Unspecified asthma, uncomplicated: Secondary | ICD-10-CM | POA: Diagnosis not present

## 2017-04-06 DIAGNOSIS — E669 Obesity, unspecified: Secondary | ICD-10-CM | POA: Diagnosis not present

## 2017-04-06 DIAGNOSIS — L97929 Non-pressure chronic ulcer of unspecified part of left lower leg with unspecified severity: Secondary | ICD-10-CM | POA: Diagnosis not present

## 2017-04-06 DIAGNOSIS — E876 Hypokalemia: Secondary | ICD-10-CM | POA: Diagnosis not present

## 2017-04-06 DIAGNOSIS — R262 Difficulty in walking, not elsewhere classified: Secondary | ICD-10-CM | POA: Diagnosis not present

## 2017-04-06 DIAGNOSIS — I87313 Chronic venous hypertension (idiopathic) with ulcer of bilateral lower extremity: Secondary | ICD-10-CM | POA: Diagnosis not present

## 2017-04-06 DIAGNOSIS — M6281 Muscle weakness (generalized): Secondary | ICD-10-CM | POA: Diagnosis not present

## 2017-04-06 DIAGNOSIS — F329 Major depressive disorder, single episode, unspecified: Secondary | ICD-10-CM | POA: Diagnosis not present

## 2017-04-06 DIAGNOSIS — D649 Anemia, unspecified: Secondary | ICD-10-CM | POA: Diagnosis not present

## 2017-04-06 DIAGNOSIS — I5031 Acute diastolic (congestive) heart failure: Secondary | ICD-10-CM | POA: Diagnosis not present

## 2017-04-06 DIAGNOSIS — L97201 Non-pressure chronic ulcer of unspecified calf limited to breakdown of skin: Secondary | ICD-10-CM | POA: Diagnosis not present

## 2017-04-06 DIAGNOSIS — D869 Sarcoidosis, unspecified: Secondary | ICD-10-CM | POA: Diagnosis not present

## 2017-04-06 DIAGNOSIS — I70238 Atherosclerosis of native arteries of right leg with ulceration of other part of lower right leg: Secondary | ICD-10-CM | POA: Diagnosis not present

## 2017-04-06 DIAGNOSIS — I5032 Chronic diastolic (congestive) heart failure: Secondary | ICD-10-CM | POA: Diagnosis not present

## 2017-04-06 DIAGNOSIS — N83202 Unspecified ovarian cyst, left side: Secondary | ICD-10-CM | POA: Diagnosis not present

## 2017-04-06 DIAGNOSIS — E869 Volume depletion, unspecified: Secondary | ICD-10-CM | POA: Diagnosis not present

## 2017-04-06 DIAGNOSIS — L97909 Non-pressure chronic ulcer of unspecified part of unspecified lower leg with unspecified severity: Secondary | ICD-10-CM | POA: Diagnosis not present

## 2017-04-06 DIAGNOSIS — I70248 Atherosclerosis of native arteries of left leg with ulceration of other part of lower left leg: Secondary | ICD-10-CM | POA: Diagnosis not present

## 2017-04-06 DIAGNOSIS — R1909 Other intra-abdominal and pelvic swelling, mass and lump: Secondary | ICD-10-CM | POA: Diagnosis not present

## 2017-04-06 DIAGNOSIS — I10 Essential (primary) hypertension: Secondary | ICD-10-CM | POA: Diagnosis not present

## 2017-04-06 LAB — CULTURE, BLOOD (ROUTINE X 2)
Culture: NO GROWTH
Culture: NO GROWTH

## 2017-04-06 LAB — RENAL FUNCTION PANEL
Albumin: 2.1 g/dL — ABNORMAL LOW (ref 3.5–5.0)
Anion gap: 4 — ABNORMAL LOW (ref 5–15)
BUN: 15 mg/dL (ref 6–20)
CO2: 22 mmol/L (ref 22–32)
Calcium: 8.8 mg/dL — ABNORMAL LOW (ref 8.9–10.3)
Chloride: 112 mmol/L — ABNORMAL HIGH (ref 101–111)
Creatinine, Ser: 1.13 mg/dL — ABNORMAL HIGH (ref 0.44–1.00)
GFR calc Af Amer: >60 mL/min (ref >60)
GFR calc non Af Amer: 54 mL/min — ABNORMAL LOW (ref >60)
Glucose, Bld: 97 mg/dL (ref 65–99)
Phosphorus: 2.6 mg/dL (ref 2.5–4.6)
Potassium: 4.6 mmol/L (ref 3.5–5.1)
Sodium: 138 mmol/L (ref 135–145)

## 2017-04-06 LAB — TYPE AND SCREEN
ABO/RH(D): O POS
Antibody Screen: NEGATIVE
Unit division: 0
Unit division: 0
Unit division: 0

## 2017-04-06 LAB — CBC
HCT: 31.6 % — ABNORMAL LOW (ref 36.0–46.0)
Hemoglobin: 9.8 g/dL — ABNORMAL LOW (ref 12.0–15.0)
MCH: 28.7 pg (ref 26.0–34.0)
MCHC: 31 g/dL (ref 30.0–36.0)
MCV: 92.4 fL (ref 78.0–100.0)
Platelets: 248 10*3/uL (ref 150–400)
RBC: 3.42 MIL/uL — ABNORMAL LOW (ref 3.87–5.11)
RDW: 17.6 % — ABNORMAL HIGH (ref 11.5–15.5)
WBC: 8.2 10*3/uL (ref 4.0–10.5)

## 2017-04-06 LAB — BPAM RBC
Blood Product Expiration Date: 201805032359
Blood Product Expiration Date: 201805032359
Blood Product Expiration Date: 201805042359
ISSUE DATE / TIME: 201804101900
Unit Type and Rh: 5100
Unit Type and Rh: 5100
Unit Type and Rh: 5100

## 2017-04-06 MED ORDER — ONDANSETRON HCL 4 MG PO TABS: 4.0000 mg | ORAL_TABLET | Freq: Four times a day (QID) | ORAL | 0 refills | Status: DC | PRN

## 2017-04-06 MED ORDER — PRO-STAT SUGAR FREE PO LIQD: 60.0000 mL | Freq: Two times a day (BID) | ORAL | 0 refills | Status: DC

## 2017-04-06 MED ORDER — ACETAMINOPHEN 325 MG PO TABS: 650.0000 mg | ORAL_TABLET | Freq: Four times a day (QID) | ORAL | Status: DC | PRN

## 2017-04-06 MED ORDER — PIPERACILLIN-TAZOBACTAM 3.375 G IVPB: 3.3750 g | Freq: Three times a day (TID) | INTRAVENOUS | Status: DC

## 2017-04-06 MED ORDER — HYDROCODONE-ACETAMINOPHEN 5-325 MG PO TABS: 1.0000 | ORAL_TABLET | Freq: Four times a day (QID) | ORAL | 0 refills | Status: DC | PRN

## 2017-04-06 MED ORDER — ADULT MULTIVITAMIN W/MINERALS CH: 1.0000 | ORAL_TABLET | Freq: Every day | ORAL | Status: DC

## 2017-04-06 MED ORDER — DAKINS (1/4 STRENGTH) 0.125 % EX SOLN: Freq: Two times a day (BID) | CUTANEOUS | 0 refills | Status: DC

## 2017-04-06 NOTE — Progress Notes (Signed)
YSubjective: Interval History: Patient offers no complaints. She denies any nausea or vomiting and no abdominal pain.  Objective: Vital signs in last 24 hours: Temp:  [98.6 F (37 C)-98.8 F (37.1 C)] 98.6 F (37 C) (04/14 0630) Pulse Rate:  [72-83] 73 (04/14 0630) Resp:  [20] 20 (04/14 0630) BP: (120-126)/(70-75) 126/70 (04/14 0630) SpO2:  [100 %] 100 % (04/14 0630) Weight change:   Intake/Output from previous day: 04/13 0701 - 04/14 0700 In: 1046.3 [P.O.:600; I.V.:318.8; IV Piggyback:127.5] Out: -  Intake/Output this shift: No intake/output data recorded.  General appearance: alert, cooperative and no distress Resp: clear to auscultation bilaterally Cardio: regular rate and rhythm GI: Abdomen is distended, nontender and no rebound tenderness Extremities: She has trace to 1+ edema she has chronic changes on her leg and has some dressing.  Lab Results:  Recent Labs  04/05/17 0622 04/06/17 0637  WBC 7.9 8.2  HGB 9.6* 9.8*  HCT 30.2* 31.6*  PLT 235 248   BMET:   Recent Labs  04/05/17 0622 04/06/17 0637  NA 135 138  K 4.7 4.6  CL 111 112*  CO2 20* 22  GLUCOSE 92 97  BUN 17 15  CREATININE 1.35* 1.13*  CALCIUM 8.7* 8.8*   No results for input(s): PTH in the last 72 hours. Iron Studies:   Recent Labs  04/05/17 0622  IRON 128  TIBC 175*  FERRITIN 161    Studies/Results: No results found.  I have reviewed the patient's current medications.  Assessment/Plan: Problem #1 acute kidney injury superimposed on chronic. Her renal function continued to improve. Her creatinine has come down to 1.13. Her renal function continued to improve.. Problem #2 chronic renal failure: Possibly stage III . Patient has previous history of acute kidney injury. Etiology could be secondary to diabetes and also recurrent acute kidney injury. Problem #3 hypokalemia: Her potassium is normal   problem #4 anemia: Her hemoglobin is low but stable. Her iron saturation is 73% and  ferritin of 161. Hence anemia of chronic disease. Problem #5 hypertension: Her blood pressure is reasonably controlled Problem #6 history of sarcoidosis Problem #7 history of left ovarian mass Plan: 1] since her renal function has progressively improved my contribution  Will be be limited. At this moment I will sign off. Thank you for letting me participate  In her care.     LOS: 4 days   Jesusa Stenerson S 04/06/2017,9:04 AM

## 2017-04-06 NOTE — Progress Notes (Signed)
Clinical Social Worker facilitated patient discharge including contacting patient family and facility to confirm patient discharge plans.  Clinical information faxed to facility and family agreeable with plan .  RN to call  RN Mohammed at [phone] for report prior to discharge.  Clinical Social Worker will sign off for now as social work intervention is no longer needed. Please consult [place] again if new need arises.  Rhea Pink, MSW, [place]

## 2017-04-06 NOTE — Progress Notes (Signed)
Patient has refused drsg. Change so far this shift. Patient states that the Dr. Aletha Halim assess in AM and is supposed to make changes. Drsg. C.D.I. Will re-assess before end of shift.

## 2017-04-06 NOTE — Progress Notes (Signed)
She says she feels better. She has been accepted to skilled care facility and they can take her this weekend so I'm going to plan to go ahead and transfer her please see discharge summary for details

## 2017-04-06 NOTE — Discharge Summary (Signed)
Physician Discharge Summary  Patient ID: Kari [name] MRN: [id number] DOB/AGE: 55-Nov-1962 55 y.o. Primary Care Physician:FANTA,TESFAYE, MD Admit date: 04/01/2017 Discharge date: 04/06/2017    Discharge Diagnoses:   Active Problems:   Venous stasis ulcers (HCC)   Volume depletion   AKI (acute kidney injury) ([place])   Hypokalemia   Cellulitis Ovarian mass Chronic systolic heart failure Allergies as of 04/06/2017   No Known Allergies     Medication List    STOP taking these medications   traMADol 50 MG tablet Commonly known as:  ULTRAM     TAKE these medications   acetaminophen 325 MG tablet Commonly known as:  TYLENOL Take 2 tablets (650 mg total) by mouth every 6 (six) hours as needed for mild pain (or Fever >/= 101).   albuterol 108 (90 Base) MCG/ACT inhaler Commonly known as:  PROVENTIL HFA;VENTOLIN HFA Inhale 2 puffs into the lungs every 6 (six) hours as needed for shortness of breath.   aspirin EC 81 MG tablet Take 81 mg by mouth daily.   feeding supplement (PRO-STAT SUGAR FREE 64) Liqd Take 60 mLs by mouth 2 (two) times daily between meals.   furosemide 40 MG tablet Commonly known as:  LASIX Take 40 mg by mouth daily.   HYDROcodone-acetaminophen 5-325 MG tablet Commonly known as:  NORCO/VICODIN Take 1-2 tablets by mouth every 6 (six) hours as needed for moderate pain.   multivitamin with minerals Tabs tablet Take 1 tablet by mouth daily. Start taking on:  04/07/2017   ondansetron 4 MG tablet Commonly known as:  ZOFRAN Take 1 tablet (4 mg total) by mouth every 6 (six) hours as needed for nausea.   piperacillin-tazobactam 3.375 GM/50ML IVPB Commonly known as:  ZOSYN Inject 50 mLs (3.375 g total) into the vein every 8 (eight) hours.   potassium chloride SA 20 MEQ tablet Commonly known as:  K-DUR,KLOR-CON Take 1 tablet (20 mEq total) by mouth daily.   SANTYL ointment Generic drug:  collagenase Apply 1 application topically daily.   sodium  hypochlorite 0.125 % Soln Commonly known as:  DAKIN'S 1/4 STRENGTH Irrigate with as directed 2 (two) times daily.            Durable Medical Equipment        Start     Ordered   04/02/17 1543  For home use only DME 3 n 1  Once     04/02/17 1542   04/02/17 1543  For home use only DME standard manual wheelchair with seat cushion  Once    Comments:  Patient suffers from CHF which impairs their ability to perform daily activities like ambulating in the home.  A walker will not resolve  issue with performing activities of daily living. A wheelchair will allow patient to safely perform daily activities. Patient can safely propel the wheelchair in the home or has a caregiver who can provide assistance.  Accessories: elevating leg rests (ELRs), wheel locks, extensions and anti-tippers.   04/02/17 1543      Discharged Condition:Improved    Consults: Nephrology/ general surgery/gynecology  Significant Diagnostic Studies: Ct Abdomen Pelvis Wo Contrast  Result Date: 04/01/2017 CLINICAL DATA:  Acute onset of worsening generalized weakness. Disorientation and dehydration. Increasing drainage from chronic skin wounds, with generalized abdominal pain and distention. Initial encounter. EXAM: CT ABDOMEN AND PELVIS WITHOUT CONTRAST TECHNIQUE: Multidetector CT imaging of the abdomen and pelvis was performed following the standard protocol without IV contrast. COMPARISON:  Renal ultrasound performed 12/18/2016 FINDINGS: Lower chest: Mild  bibasilar atelectasis noted. The heart is borderline normal in size. Hepatobiliary: The liver is unremarkable in appearance. The gallbladder is difficult to fully assess due to motion artifact, but appears grossly unremarkable. The common bile duct remains normal in caliber. Pancreas: The pancreas is within normal limits. Spleen: The spleen is unremarkable in appearance. Adrenals/Urinary Tract: The adrenal glands are unremarkable in appearance. The kidneys are within  normal limits. There is no evidence of hydronephrosis. No renal or ureteral stones are identified. No perinephric stranding is seen. Stomach/Bowel: The stomach is unremarkable in appearance. The small bowel is within normal limits. The appendix is not well characterized but appears grossly normal in caliber, without evidence of appendicitis. The colon is unremarkable in appearance. Vascular/Lymphatic: The abdominal aorta is unremarkable in appearance. The inferior vena cava is grossly unremarkable. No retroperitoneal lymphadenopathy is seen. No pelvic sidewall lymphadenopathy is identified. Reproductive: The bladder is moderately distended and grossly unremarkable. The uterus is grossly unremarkable. Nabothian cysts are noted at the cervix. The right ovary is grossly unremarkable. There is a very large 31.2 x 26.1 x 19.8 cm cystic mass filling much of the abdomen. Given its appearance, this more likely arises from the left ovary. Other: Mild skin thickening is noted along the patient's pannus. Known chronic skin wounds are difficult fully characterize. Musculoskeletal: No acute osseous abnormalities are identified. The visualized musculature is unremarkable in appearance. IMPRESSION: 1. Very large 31.2 x 26.1 x 19.8 cm cystic mass filling much of the abdomen. Given its appearance, this more likely arises from the left ovary. This explains the patient's worsening abdominal distention. Surgical consultation is recommended for removal of the cystic mass. 2. Mild bibasilar atelectasis noted. 3. Mild skin thickening along the patient's pannus. Electronically Signed   By: Garald Balding M.D.   On: 04/01/2017 23:12   Dg Chest Port 1 View  Result Date: 04/01/2017 CLINICAL DATA:  Weakness that began yesterday. Cellulitis. Sarcoidosis. Morbid obesity. EXAM: PORTABLE CHEST 1 VIEW COMPARISON:  Most recent chest radiograph 12/18/2016. Most recent CT chest 10/08/2013. FINDINGS: Cardiomegaly. Normal mediastinal contours. No  active infiltrates or failure. Unusual air shadow under the RIGHT hemidiaphragm could simply represent colon interposition although on prior chest CT, and on prior chest radiographs, the colon is not interposed above the liver. In the appropriate setting, the findings could represent pneumoperitoneum. Air-filled splenic flexure appears unremarkable, under the LEFT hemidiaphragm. No osseous findings. IMPRESSION: Cannot confidently exclude free air under the RIGHT hemidiaphragm, although colonic interposition above the liver could certainly have this appearance. Recommend noncontrast CT scan of the abdomen pelvis for further evaluation. Findings discussed with ordering provider. Cardiomegaly without active infiltrates or failure. Electronically Signed   By: Staci Righter M.D.   On: 04/01/2017 21:45    Lab Results: Basic Metabolic Panel:  Recent Labs  04/03/17 2355 04/05/17 0622 04/06/17 0637  NA 138 135 138  K 3.6 4.7 4.6  CL 110 111 112*  CO2 20* 20* 22  GLUCOSE 116* 92 97  BUN 19 17 15   CREATININE 1.57* 1.35* 1.13*  CALCIUM 8.4* 8.7* 8.8*  MG 2.2  --   --   PHOS  --  2.3* 2.6   Liver Function Tests:  Recent Labs  04/05/17 0622 04/06/17 0637  ALBUMIN 2.1* 2.1*     CBC:  Recent Labs  04/05/17 0622 04/06/17 0637  WBC 7.9 8.2  HGB 9.6* 9.8*  HCT 30.2* 31.6*  MCV 91.2 92.4  PLT 235 248    Recent Results (from the past  240 hour(s))  Blood Culture (routine x 2)     Status: None (Preliminary result)   Collection Time: 04/01/17  9:47 PM  Result Value Ref Range Status   Specimen Description LEFT ANTECUBITAL  Final   Special Requests   Final    BOTTLES DRAWN AEROBIC ONLY Blood Culture results may not be optimal due to an inadequate volume of blood received in culture bottles   Culture NO GROWTH 4 DAYS  Final   Report Status PENDING  Incomplete  Blood Culture (routine x 2)     Status: None (Preliminary result)   Collection Time: 04/01/17  9:52 PM  Result Value Ref Range  Status   Specimen Description BLOOD LEFT WRIST  Final   Special Requests   Final    BOTTLES DRAWN AEROBIC ONLY Blood Culture results may not be optimal due to an inadequate volume of blood received in culture bottles   Culture NO GROWTH 4 DAYS  Final   Report Status PENDING  Incomplete  Surgical PCR screen     Status: None   Collection Time: 04/02/17  4:00 PM  Result Value Ref Range Status   MRSA, PCR NEGATIVE NEGATIVE Final   Staphylococcus aureus NEGATIVE NEGATIVE Final    Comment:        The Xpert SA Assay (FDA approved for NASAL specimens in patients over 93 years of age), is one component of a comprehensive surveillance program.  Test performance has been validated by [facility] for patients greater than or equal to 35 year old. It is not intended to diagnose infection nor to guide or monitor treatment.   Urine culture     Status: Abnormal   Collection Time: 04/02/17  4:34 PM  Result Value Ref Range Status   Specimen Description URINE, CLEAN CATCH  Final   Special Requests NONE  Final   Culture MULTIPLE SPECIES PRESENT, SUGGEST RECOLLECTION (A)  Final   Report Status 04/04/2017 FINAL  Final     Hospital Course: This is a 56 year old who came to the hospital because of weakness and wounds on her lower extremities. She's been having drainage and has cellulitis. She was started on Zosyn which improved her situation. It was felt that she needed skilled care facility for further treatment of the leg ulcers and that has been arranged. She had acute kidney injury and that has improved. She had CT of the abdomen that showed a large ovarian mass and had consultation from gynecology but it was felt that she should have surgery at a later time when she is better medically. She is receiving IV antibiotics which will continue at the skilled care facility.  Discharge Exam: Blood pressure 126/70, pulse 73, temperature 98.6 F (37 C), temperature source Oral, resp. rate 20, height 5\' 4"   (1.626 m), weight 133.8 kg (294 lb 15.6 oz), last menstrual period 11/21/2011, SpO2 100 %. She is awake and alert. She still has significant ulcerations on her legs. Her chest is clear. Her abdomen is swollen  Disposition: To skilled care facility. She was receiving IV Zosyn for at least 5 more days. This can be reassessed at that time. She will need PT OT and speech as needed.  Discharge Instructions    Diet - low sodium heart healthy    Complete by:  As directed    Increase activity slowly    Complete by:  As directed         Signed: Elster Corbello L   04/06/2017, 10:32 AM

## 2017-04-08 ENCOUNTER — Encounter: Payer: Self-pay | Admitting: Internal Medicine

## 2017-04-08 ENCOUNTER — Non-Acute Institutional Stay (SKILLED_NURSING_FACILITY): Payer: Medicare Other | Admitting: Internal Medicine

## 2017-04-08 DIAGNOSIS — L97202 Non-pressure chronic ulcer of unspecified calf with fat layer exposed: Secondary | ICD-10-CM

## 2017-04-08 DIAGNOSIS — N179 Acute kidney failure, unspecified: Secondary | ICD-10-CM | POA: Diagnosis not present

## 2017-04-08 DIAGNOSIS — I10 Essential (primary) hypertension: Secondary | ICD-10-CM

## 2017-04-08 DIAGNOSIS — I5031 Acute diastolic (congestive) heart failure: Secondary | ICD-10-CM

## 2017-04-08 DIAGNOSIS — I83002 Varicose veins of unspecified lower extremity with ulcer of calf: Secondary | ICD-10-CM | POA: Diagnosis not present

## 2017-04-08 NOTE — Progress Notes (Signed)
Provider:  Veleta Miners Location:   [place] Room Number: 153/P Place of Service:  SNF (50)  PCP: Rosita Fire, MD Patient Care Team: Rosita Fire, MD as PCP - General (Internal Medicine)  Extended Emergency Contact Information Primary Emergency Contact: Franki [place] of [place] Mobile Phone: [phone] Relation: Brother Secondary Emergency Contact: Kalman Jewels [place] of [place] Mobile Phone: [phone] Relation: None  Code Status: Full Code Goals of Care: Advanced Directive information Advanced Directives 04/08/2017  Does Patient Have a Medical Advance Directive? Yes  Type of Advance Directive (No Data)  Does patient want to make changes to medical advance directive? No - Patient declined  Would patient like information on creating a medical advance directive? No - Patient declined  Pre-existing out of facility DNR order (yellow form or pink MOST form) -      Chief Complaint  Patient presents with  . New Admit To SNF    HPI: Patient is a 56 y.o. female seen today for admission to SNF for Therapy and Wound care. Patient has h/o Chronic Venous Ulcers in Both LE. She has had prevoius admissions with worsening wound and is followed by Home health care nurse for dressings. But according to patient the wounds were getting worse and she was feeling that there was increased drainage from the wound. She was admitted in the hospital and was started on Vancomycin and  Zosyn. Her Blood cultures stayed negative and she was transitioned to Zosyn and is discharged on Zosyn  To facility. She also has Acute renal failure with creat peaking to 2.23 but improved with Hydration and was down to 1.13 on discharge. There was also incidental finding of  Large Left Ovarian Cyst . Was evaluated by Gynecology who recommended Surgery when patient more stable. Patient is doing well in facility. She was very independent before getting sick with  the wound. She lives with her sister and was walking with Gilford Rile. Her pain is controlled and she says her wound looks much better then it did before.  Past Medical History:  Diagnosis Date  . Asthma   . CHF (congestive heart failure) ([place]) 03/12/2014  . Depression   . Headache(784.0)   . Hyperlipidemia   . Hypertension   . Lung nodules   . Renal disorder   . Sarcoidosis   . Ulcer    left posterior calf   Past Surgical History:  Procedure Laterality Date  . cataract surgery Left 07-2013  . NO PAST SURGERIES      reports that she has never smoked. She has never used smokeless tobacco. She reports that she does not drink alcohol or use drugs. Social History   Social History  . Marital status: Single    Spouse name: N/A  . Number of children: 1  . Years of education: N/A   Occupational History  . Not on file.   Social History Main Topics  . Smoking status: Never Smoker  . Smokeless tobacco: Never Used  . Alcohol use No  . Drug use: No  . Sexual activity: Not Currently   Other Topics Concern  . Not on file   Social History Narrative   Lives alone.    Functional Status Survey:    Family History  Problem Relation Age of Onset  . Heart failure Mother   . Diabetes Mellitus II Mother   . Hypertension Mother   . Cancer Mother     unknown type  . Heart disease Father   .  Stroke Father   . Diabetes Mellitus II Father     Health Maintenance  Topic Date Due  . Hepatitis C Screening  [birth year]  . TETANUS/TDAP  02/09/1981  . PAP SMEAR  02/09/1983  . MAMMOGRAM  02/10/2012  . COLONOSCOPY  02/10/2012  . INFLUENZA VACCINE  07/24/2017  . HIV Screening  Completed    No Known Allergies  Allergies as of 04/08/2017   No Known Allergies     Medication List       Accurate as of 04/08/17 11:06 AM. Always use your most recent med list.          acetaminophen 325 MG tablet Commonly known as:  TYLENOL Take 2 tablets (650 mg total) by mouth every 6 (six) hours  as needed for mild pain (or Fever >/= 101).   albuterol 108 (90 Base) MCG/ACT inhaler Commonly known as:  PROVENTIL HFA;VENTOLIN HFA Inhale 2 puffs into the lungs every 6 (six) hours as needed for shortness of breath.   aspirin EC 81 MG tablet Take 81 mg by mouth daily.   feeding supplement (PRO-STAT SUGAR FREE 64) Liqd Take 60 mLs by mouth 2 (two) times daily between meals.   furosemide 40 MG tablet Commonly known as:  LASIX Take 40 mg by mouth daily.   HYDROcodone-acetaminophen 5-325 MG tablet Commonly known as:  NORCO/VICODIN Take 1-2 tablets by mouth every 6 (six) hours as needed for moderate pain.   multivitamin with minerals Tabs tablet Take 1 tablet by mouth daily.   ondansetron 4 MG tablet Commonly known as:  ZOFRAN Take 1 tablet (4 mg total) by mouth every 6 (six) hours as needed for nausea.   piperacillin-tazobactam 3.375 GM/50ML IVPB Commonly known as:  ZOSYN Inject 50 mLs (3.375 g total) into the vein every 8 (eight) hours.   potassium chloride SA 20 MEQ tablet Commonly known as:  K-DUR,KLOR-CON Take 1 tablet (20 mEq total) by mouth daily.   SANTYL ointment Generic drug:  collagenase Apply 1 application topically 2 (two) times daily.       Review of Systems  Review of Systems  Constitutional: Negative for activity change, appetite change, chills, diaphoresis, fatigue and fever.  HENT: Negative for mouth sores, postnasal drip, rhinorrhea, sinus pain and sore throat.   Respiratory: Negative for apnea, cough, chest tightness, shortness of breath and wheezing.   Cardiovascular: Negative for chest pain, palpitations  Gastrointestinal: Negative for abdominal distention, abdominal pain, constipation, diarrhea, nausea and vomiting.  Genitourinary: Negative for dysuria and frequency.  Musculoskeletal: Negative for arthralgias, joint swelling and myalgias.  Skin: Negative for rash.  Neurological: Negative for dizziness, syncope, weakness, light-headedness and  numbness.  Psychiatric/Behavioral: Negative for behavioral problems, confusion and sleep disturbance.     Vitals:   04/08/17 1054  BP: 114/65  Pulse: 76  Resp: 20  Temp: 98.4 F (36.9 C)  TempSrc: Oral   There is no height or weight on file to calculate BMI. Physical Exam  Constitutional: She appears well-developed and well-nourished.  HENT:  Head: Normocephalic.  Mouth/Throat: Oropharynx is clear and moist.  Eyes: Pupils are equal, round, and reactive to light.  Neck: Neck supple.  Cardiovascular: Normal rate and normal heart sounds.  An irregular rhythm present.  Pulmonary/Chest: Effort normal and breath sounds normal. No respiratory distress. She has no wheezes. She has no rales.  Abdominal: Soft. Bowel sounds are normal. She exhibits no distension. There is no tenderness. There is no rebound.  Skin:  Deep venous stasis  wound in  Labs reviewed: Basic Metabolic Panel:  Recent Labs  12/19/16 0438  04/02/17 0632  04/03/17 2355 04/05/17 0622 04/06/17 0637  NA 137  < >  --   < > 138 135 138  K 3.4*  < >  --   < > 3.6 4.7 4.6  CL 113*  < >  --   < > 110 111 112*  CO2 20*  < >  --   < > 20* 20* 22  GLUCOSE 104*  < >  --   < > 116* 92 97  BUN 27*  < >  --   < > 19 17 15   CREATININE 1.77*  < >  --   < > 1.57* 1.35* 1.13*  CALCIUM 8.6*  < >  --   < > 8.4* 8.7* 8.8*  MG 2.1  --  2.2  --  2.2  --   --   PHOS  --   --   --   --   --  2.3* 2.6  < > = values in this interval not displayed. Liver Function Tests:  Recent Labs  12/18/16 1038 04/01/17 2145 04/02/17 0604 04/05/17 0622 04/06/17 0637  AST 10* 109* 89*  --   --   ALT 8* 30 25  --   --   ALKPHOS 36* 43 34*  --   --   BILITOT 0.3 0.9 0.8  --   --   PROT 7.1 7.9 6.6  --   --   ALBUMIN 2.2* 2.8* 2.2* 2.1* 2.1*   No results for input(s): LIPASE, AMYLASE in the last 8760 hours. No results for input(s): AMMONIA in the last 8760 hours. CBC:  Recent Labs  04/01/17 2145  04/03/17 2355 04/05/17 0622  04/06/17 0637  WBC 12.7*  < > 8.9 7.9 8.2  NEUTROABS 10.4*  --   --   --   --   HGB 9.7*  < > 9.3* 9.6* 9.8*  HCT 29.7*  < > 28.6* 30.2* 31.6*  MCV 87.4  < > 89.1 91.2 92.4  PLT 313  < > 253 235 248  < > = values in this interval not displayed. Cardiac Enzymes:  Recent Labs  12/17/16 2055 12/18/16 0034 12/18/16 0619  TROPONINI <0.03 <0.03 <0.03   BNP: Invalid input(s): POCBNP Lab Results  Component Value Date   HGBA1C 5.6 12/17/2016   Lab Results  Component Value Date   TSH 5.312 (H) 12/17/2016   Lab Results  Component Value Date   VITAMINB12 143 (L) 12/18/2016   Lab Results  Component Value Date   FOLATE 9.9 12/18/2016   Lab Results  Component Value Date   IRON 128 04/05/2017   TIBC 175 (L) 04/05/2017   FERRITIN 161 04/05/2017    Imaging and Procedures obtained prior to SNF admission: Ct Abdomen Pelvis Wo Contrast  Result Date: 04/01/2017 CLINICAL DATA:  Acute onset of worsening generalized weakness. Disorientation and dehydration. Increasing drainage from chronic skin wounds, with generalized abdominal pain and distention. Initial encounter. EXAM: CT ABDOMEN AND PELVIS WITHOUT CONTRAST TECHNIQUE: Multidetector CT imaging of the abdomen and pelvis was performed following the standard protocol without IV contrast. COMPARISON:  Renal ultrasound performed 12/18/2016 FINDINGS: Lower chest: Mild bibasilar atelectasis noted. The heart is borderline normal in size. Hepatobiliary: The liver is unremarkable in appearance. The gallbladder is difficult to fully assess due to motion artifact, but appears grossly unremarkable. The common bile duct remains normal in caliber. Pancreas: The pancreas is within normal  limits. Spleen: The spleen is unremarkable in appearance. Adrenals/Urinary Tract: The adrenal glands are unremarkable in appearance. The kidneys are within normal limits. There is no evidence of hydronephrosis. No renal or ureteral stones are identified. No perinephric  stranding is seen. Stomach/Bowel: The stomach is unremarkable in appearance. The small bowel is within normal limits. The appendix is not well characterized but appears grossly normal in caliber, without evidence of appendicitis. The colon is unremarkable in appearance. Vascular/Lymphatic: The abdominal aorta is unremarkable in appearance. The inferior vena cava is grossly unremarkable. No retroperitoneal lymphadenopathy is seen. No pelvic sidewall lymphadenopathy is identified. Reproductive: The bladder is moderately distended and grossly unremarkable. The uterus is grossly unremarkable. Nabothian cysts are noted at the cervix. The right ovary is grossly unremarkable. There is a very large 31.2 x 26.1 x 19.8 cm cystic mass filling much of the abdomen. Given its appearance, this more likely arises from the left ovary. Other: Mild skin thickening is noted along the patient's pannus. Known chronic skin wounds are difficult fully characterize. Musculoskeletal: No acute osseous abnormalities are identified. The visualized musculature is unremarkable in appearance. IMPRESSION: 1. Very large 31.2 x 26.1 x 19.8 cm cystic mass filling much of the abdomen. Given its appearance, this more likely arises from the left ovary. This explains the patient's worsening abdominal distention. Surgical consultation is recommended for removal of the cystic mass. 2. Mild bibasilar atelectasis noted. 3. Mild skin thickening along the patient's pannus. Electronically Signed   By: Garald Balding M.D.   On: 04/01/2017 23:12   Dg Chest Port 1 View  Result Date: 04/01/2017 CLINICAL DATA:  Weakness that began yesterday. Cellulitis. Sarcoidosis. Morbid obesity. EXAM: PORTABLE CHEST 1 VIEW COMPARISON:  Most recent chest radiograph 12/18/2016. Most recent CT chest 10/08/2013. FINDINGS: Cardiomegaly. Normal mediastinal contours. No active infiltrates or failure. Unusual air shadow under the RIGHT hemidiaphragm could simply represent colon  interposition although on prior chest CT, and on prior chest radiographs, the colon is not interposed above the liver. In the appropriate setting, the findings could represent pneumoperitoneum. Air-filled splenic flexure appears unremarkable, under the LEFT hemidiaphragm. No osseous findings. IMPRESSION: Cannot confidently exclude free air under the RIGHT hemidiaphragm, although colonic interposition above the liver could certainly have this appearance. Recommend noncontrast CT scan of the abdomen pelvis for further evaluation. Findings discussed with ordering provider. Cardiomegaly without active infiltrates or failure. Electronically Signed   By: Staci Righter M.D.   On: 04/01/2017 21:45    Assessment/Plan Venous stasis ulcer of calf  Patient doing well with IV Zosyn. D/W The wound care nurse the wound. Discontinue Santyl and start on Silver alginate on Right LE as the wound is very wet with drainage. The Base of the Ulcer is clean. Left LE has clear edges and Base. Just do Dry dressing for Left extremity. White count is normal and drainage has no Odor. Contiue Hydrocodone for pain control.   Essential hypertension Patient says her BP is stable on Lasix. Continue Lasix. Repeat BMP.  Acute diastolic CHF (congestive heart failure)  EF was 55 % in 2017. Patient is doing well on Lasix.  Acute renal failure Was Pre renal as she was dehydrated. Doing well now and her Creat is back to her baseline.  Family/ staff Communication:   Labs/tests ordered: CBC And BMP in 1 week.  Total time spent in this patient care encounter was 45_ minutes; greater than 50% of the visit spent counseling patient and coordinating care for problems addressed at this encounter.

## 2017-04-11 ENCOUNTER — Encounter: Payer: Self-pay | Admitting: Internal Medicine

## 2017-04-11 ENCOUNTER — Encounter (HOSPITAL_COMMUNITY)
Admission: RE | Admit: 2017-04-11 | Discharge: 2017-04-11 | Disposition: A | Discharge status: Home / Self Care | Payer: Medicare Other | Source: Skilled Nursing Facility | Attending: Internal Medicine | Admitting: Internal Medicine

## 2017-04-11 ENCOUNTER — Non-Acute Institutional Stay (SKILLED_NURSING_FACILITY): Payer: Medicare Other | Admitting: Internal Medicine

## 2017-04-11 DIAGNOSIS — I83029 Varicose veins of left lower extremity with ulcer of unspecified site: Secondary | ICD-10-CM

## 2017-04-11 DIAGNOSIS — I83002 Varicose veins of unspecified lower extremity with ulcer of calf: Secondary | ICD-10-CM | POA: Insufficient documentation

## 2017-04-11 DIAGNOSIS — L97929 Non-pressure chronic ulcer of unspecified part of left lower leg with unspecified severity: Secondary | ICD-10-CM

## 2017-04-11 DIAGNOSIS — E876 Hypokalemia: Secondary | ICD-10-CM

## 2017-04-11 DIAGNOSIS — I83019 Varicose veins of right lower extremity with ulcer of unspecified site: Secondary | ICD-10-CM | POA: Diagnosis not present

## 2017-04-11 DIAGNOSIS — L97919 Non-pressure chronic ulcer of unspecified part of right lower leg with unspecified severity: Secondary | ICD-10-CM

## 2017-04-11 DIAGNOSIS — L97209 Non-pressure chronic ulcer of unspecified calf with unspecified severity: Secondary | ICD-10-CM | POA: Insufficient documentation

## 2017-04-11 LAB — CBC WITH DIFFERENTIAL/PLATELET
Basophils Absolute: 0 10*3/uL (ref 0.0–0.1)
Basophils Relative: 1 %
Eosinophils Absolute: 0.3 10*3/uL (ref 0.0–0.7)
Eosinophils Relative: 5 %
HCT: 30.6 % — ABNORMAL LOW (ref 36.0–46.0)
Hemoglobin: 9.6 g/dL — ABNORMAL LOW (ref 12.0–15.0)
Lymphocytes Relative: 25 %
Lymphs Abs: 1.4 10*3/uL (ref 0.7–4.0)
MCH: 28.9 pg (ref 26.0–34.0)
MCHC: 31.4 g/dL (ref 30.0–36.0)
MCV: 92.2 fL (ref 78.0–100.0)
Monocytes Absolute: 0.6 10*3/uL (ref 0.1–1.0)
Monocytes Relative: 11 %
Neutro Abs: 3.3 10*3/uL (ref 1.7–7.7)
Neutrophils Relative %: 60 %
Platelets: 223 10*3/uL (ref 150–400)
RBC: 3.32 MIL/uL — ABNORMAL LOW (ref 3.87–5.11)
RDW: 17.6 % — ABNORMAL HIGH (ref 11.5–15.5)
WBC: 5.6 10*3/uL (ref 4.0–10.5)

## 2017-04-11 LAB — BASIC METABOLIC PANEL
Anion gap: 8 (ref 5–15)
BUN: 14 mg/dL (ref 6–20)
CO2: 27 mmol/L (ref 22–32)
Calcium: 8.7 mg/dL — ABNORMAL LOW (ref 8.9–10.3)
Chloride: 104 mmol/L (ref 101–111)
Creatinine, Ser: 1.27 mg/dL — ABNORMAL HIGH (ref 0.44–1.00)
GFR calc Af Amer: 54 mL/min — ABNORMAL LOW (ref >60)
GFR calc non Af Amer: 47 mL/min — ABNORMAL LOW (ref >60)
Glucose, Bld: 108 mg/dL — ABNORMAL HIGH (ref 65–99)
Potassium: 2.8 mmol/L — ABNORMAL LOW (ref 3.5–5.1)
Sodium: 139 mmol/L (ref 135–145)

## 2017-04-11 NOTE — Progress Notes (Signed)
Location:   [facility] [place] Room Number: 153/P Place of Service:  SNF (31) Provider:  Geralyn Flash, MD  Patient Care Team: Rosita Fire, MD as PCP - General (Internal Medicine)  Extended Emergency Contact Information Primary Emergency Contact: Franki [place] of [place] Mobile Phone: [phone] Relation: Brother Secondary Emergency Contact: Kalman Jewels [place] of [place] Mobile Phone: [phone] Relation: None  Code Status:  Full Code Goals of care: Advanced Directive information Advanced Directives 04/11/2017  Does Patient Have a Medical Advance Directive? Yes  Type of Advance Directive (No Data)  Does patient want to make changes to medical advance directive? No - Patient declined  Would patient like information on creating a medical advance directive? No - Patient declined  Pre-existing out of facility DNR order (yellow form or pink MOST form) -     Chief Complaint  Patient presents with  . Acute Visit    Hypokalemia    HPI:  Pt is a 56 y.o. female seen today for an acute visit for Hypokalemia. Patient is in the facility for Wound care and Therapy. She has Chronic Venous ulcers. Her Potassium today was 2.8 . She denies any nausea or vomiting. She is not having any diarrhea.She is eating well. Her Wounds are also doing well and pain is well controlled.    Past Medical History:  Diagnosis Date  . Asthma   . CHF (congestive heart failure) ([place]) 03/12/2014  . Depression   . Headache(784.0)   . Hyperlipidemia   . Hypertension   . Lung nodules   . Renal disorder   . Sarcoidosis   . Ulcer    left posterior calf   Past Surgical History:  Procedure Laterality Date  . cataract surgery Left 07-2013  . NO PAST SURGERIES      No Known Allergies  Outpatient Encounter Prescriptions as of 04/11/2017  Medication Sig  . acetaminophen (TYLENOL) 325 MG tablet Take 2 tablets (650 mg total) by mouth every 6  (six) hours as needed for mild pain (or Fever >/= 101).  Marland Kitchen albuterol (PROVENTIL HFA;VENTOLIN HFA) 108 (90 BASE) MCG/ACT inhaler Inhale 2 puffs into the lungs every 6 (six) hours as needed for shortness of breath.  . Amino Acids-Protein Hydrolys (FEEDING SUPPLEMENT, PRO-STAT SUGAR FREE 64,) LIQD Take 60 mLs by mouth 2 (two) times daily between meals.  Marland Kitchen aspirin EC 81 MG tablet Take 81 mg by mouth daily.  . furosemide (LASIX) 40 MG tablet Take 40 mg by mouth daily.  Marland Kitchen HYDROcodone-acetaminophen (NORCO/VICODIN) 5-325 MG tablet Take 1-2 tablets by mouth every 6 (six) hours as needed for moderate pain.  . Multiple Vitamin (MULTIVITAMIN WITH MINERALS) TABS tablet Take 1 tablet by mouth daily.  . ondansetron (ZOFRAN) 4 MG tablet Take 1 tablet (4 mg total) by mouth every 6 (six) hours as needed for nausea.  . piperacillin-tazobactam (ZOSYN) 3.375 GM/50ML IVPB Inject 50 mLs (3.375 g total) into the vein every 8 (eight) hours.  . potassium chloride SA (K-DUR,KLOR-CON) 20 MEQ tablet Take 1 tablet (20 mEq total) by mouth daily.  . [DISCONTINUED] SANTYL ointment Apply 1 application topically 2 (two) times daily.    No facility-administered encounter medications on file as of 04/11/2017.      Review of Systems  Constitutional: Negative for activity change.  Respiratory: Negative.   Cardiovascular: Negative.   Gastrointestinal: Negative.     Immunization History  Administered Date(s) Administered  . Influenza Split 09/04/2012  . Influenza-Unspecified 10/24/2016  . Pneumococcal  Polysaccharide-23 06/22/2012   Pertinent  Health Maintenance Due  Topic Date Due  . PAP SMEAR  02/09/1983  . MAMMOGRAM  02/10/2012  . COLONOSCOPY  02/10/2012  . INFLUENZA VACCINE  07/24/2017   No flowsheet data found. Functional Status Survey:    There were no vitals filed for this visit. There is no height or weight on file to calculate BMI. Physical Exam  Constitutional: She appears well-developed and  well-nourished.  HENT:  Head: Normocephalic.  Mouth/Throat: Oropharynx is clear and moist.  Cardiovascular: Normal rate, regular rhythm and normal heart sounds.   No murmur heard. Pulmonary/Chest: Effort normal and breath sounds normal. No respiratory distress. She has no wheezes. She has no rales.  Abdominal: Soft. Bowel sounds are normal. She exhibits no distension. There is no tenderness. There is no rebound.    Labs reviewed:  Recent Labs  12/19/16 0438  04/02/17 1914  04/03/17 2355 04/05/17 0622 04/06/17 0637 04/11/17 0700  NA 137  < >  --   < > 138 135 138 139  K 3.4*  < >  --   < > 3.6 4.7 4.6 2.8*  CL 113*  < >  --   < > 110 111 112* 104  CO2 20*  < >  --   < > 20* 20* 22 27  GLUCOSE 104*  < >  --   < > 116* 92 97 108*  BUN 27*  < >  --   < > 19 17 15 14   CREATININE 1.77*  < >  --   < > 1.57* 1.35* 1.13* 1.27*  CALCIUM 8.6*  < >  --   < > 8.4* 8.7* 8.8* 8.7*  MG 2.1  --  2.2  --  2.2  --   --   --   PHOS  --   --   --   --   --  2.3* 2.6  --   < > = values in this interval not displayed.  Recent Labs  12/18/16 1038 04/01/17 2145 04/02/17 0604 04/05/17 0622 04/06/17 0637  AST 10* 109* 89*  --   --   ALT 8* 30 25  --   --   ALKPHOS 36* 43 34*  --   --   BILITOT 0.3 0.9 0.8  --   --   PROT 7.1 7.9 6.6  --   --   ALBUMIN 2.2* 2.8* 2.2* 2.1* 2.1*    Recent Labs  04/01/17 2145  04/05/17 0622 04/06/17 0637 04/11/17 0700  WBC 12.7*  < > 7.9 8.2 5.6  NEUTROABS 10.4*  --   --   --  3.3  HGB 9.7*  < > 9.6* 9.8* 9.6*  HCT 29.7*  < > 30.2* 31.6* 30.6*  MCV 87.4  < > 91.2 92.4 92.2  PLT 313  < > 235 248 223  < > = values in this interval not displayed. Lab Results  Component Value Date   TSH 5.312 (H) 12/17/2016   Lab Results  Component Value Date   HGBA1C 5.6 12/17/2016   Lab Results  Component Value Date   CHOL 135 06/21/2012   HDL 54 06/21/2012   LDLCALC 69 06/21/2012   TRIG 59 06/21/2012   CHOLHDL 2.5 06/21/2012    Significant Diagnostic  Results in last 30 days:  Ct Abdomen Pelvis Wo Contrast  Result Date: 04/01/2017 CLINICAL DATA:  Acute onset of worsening generalized weakness. Disorientation and dehydration. Increasing drainage from chronic skin wounds, with  generalized abdominal pain and distention. Initial encounter. EXAM: CT ABDOMEN AND PELVIS WITHOUT CONTRAST TECHNIQUE: Multidetector CT imaging of the abdomen and pelvis was performed following the standard protocol without IV contrast. COMPARISON:  Renal ultrasound performed 12/18/2016 FINDINGS: Lower chest: Mild bibasilar atelectasis noted. The heart is borderline normal in size. Hepatobiliary: The liver is unremarkable in appearance. The gallbladder is difficult to fully assess due to motion artifact, but appears grossly unremarkable. The common bile duct remains normal in caliber. Pancreas: The pancreas is within normal limits. Spleen: The spleen is unremarkable in appearance. Adrenals/Urinary Tract: The adrenal glands are unremarkable in appearance. The kidneys are within normal limits. There is no evidence of hydronephrosis. No renal or ureteral stones are identified. No perinephric stranding is seen. Stomach/Bowel: The stomach is unremarkable in appearance. The small bowel is within normal limits. The appendix is not well characterized but appears grossly normal in caliber, without evidence of appendicitis. The colon is unremarkable in appearance. Vascular/Lymphatic: The abdominal aorta is unremarkable in appearance. The inferior vena cava is grossly unremarkable. No retroperitoneal lymphadenopathy is seen. No pelvic sidewall lymphadenopathy is identified. Reproductive: The bladder is moderately distended and grossly unremarkable. The uterus is grossly unremarkable. Nabothian cysts are noted at the cervix. The right ovary is grossly unremarkable. There is a very large 31.2 x 26.1 x 19.8 cm cystic mass filling much of the abdomen. Given its appearance, this more likely arises from the  left ovary. Other: Mild skin thickening is noted along the patient's pannus. Known chronic skin wounds are difficult fully characterize. Musculoskeletal: No acute osseous abnormalities are identified. The visualized musculature is unremarkable in appearance. IMPRESSION: 1. Very large 31.2 x 26.1 x 19.8 cm cystic mass filling much of the abdomen. Given its appearance, this more likely arises from the left ovary. This explains the patient's worsening abdominal distention. Surgical consultation is recommended for removal of the cystic mass. 2. Mild bibasilar atelectasis noted. 3. Mild skin thickening along the patient's pannus. Electronically Signed   By: Garald Balding M.D.   On: 04/01/2017 23:12   Dg Chest Port 1 View  Result Date: 04/01/2017 CLINICAL DATA:  Weakness that began yesterday. Cellulitis. Sarcoidosis. Morbid obesity. EXAM: PORTABLE CHEST 1 VIEW COMPARISON:  Most recent chest radiograph 12/18/2016. Most recent CT chest 10/08/2013. FINDINGS: Cardiomegaly. Normal mediastinal contours. No active infiltrates or failure. Unusual air shadow under the RIGHT hemidiaphragm could simply represent colon interposition although on prior chest CT, and on prior chest radiographs, the colon is not interposed above the liver. In the appropriate setting, the findings could represent pneumoperitoneum. Air-filled splenic flexure appears unremarkable, under the LEFT hemidiaphragm. No osseous findings. IMPRESSION: Cannot confidently exclude free air under the RIGHT hemidiaphragm, although colonic interposition above the liver could certainly have this appearance. Recommend noncontrast CT scan of the abdomen pelvis for further evaluation. Findings discussed with ordering provider. Cardiomegaly without active infiltrates or failure. Electronically Signed   By: Staci Righter M.D.   On: 04/01/2017 21:45    Assessment/Plan Hypokalemia Gave her extra dose of Potassium 40 this morning beside her 20 she got. Will give her 40  meq tonight. Increased the dose of her potassium to 40 meq. continue same dose of Lasix. Follow up labs in few days.   Family/ staff Communication:   Labs/tests ordered:

## 2017-04-15 ENCOUNTER — Encounter (HOSPITAL_COMMUNITY)
Admission: RE | Admit: 2017-04-15 | Discharge: 2017-04-15 | Disposition: A | Discharge status: Home / Self Care | Payer: Medicare Other | Source: Skilled Nursing Facility | Attending: Internal Medicine | Admitting: Internal Medicine

## 2017-04-15 DIAGNOSIS — D649 Anemia, unspecified: Secondary | ICD-10-CM | POA: Insufficient documentation

## 2017-04-15 DIAGNOSIS — A419 Sepsis, unspecified organism: Secondary | ICD-10-CM | POA: Insufficient documentation

## 2017-04-15 DIAGNOSIS — D869 Sarcoidosis, unspecified: Secondary | ICD-10-CM | POA: Insufficient documentation

## 2017-04-15 DIAGNOSIS — I5032 Chronic diastolic (congestive) heart failure: Secondary | ICD-10-CM | POA: Insufficient documentation

## 2017-04-15 DIAGNOSIS — E669 Obesity, unspecified: Secondary | ICD-10-CM | POA: Insufficient documentation

## 2017-04-15 DIAGNOSIS — F329 Major depressive disorder, single episode, unspecified: Secondary | ICD-10-CM | POA: Insufficient documentation

## 2017-04-15 DIAGNOSIS — I10 Essential (primary) hypertension: Secondary | ICD-10-CM | POA: Insufficient documentation

## 2017-04-15 LAB — BASIC METABOLIC PANEL
Anion gap: 6 (ref 5–15)
BUN: 14 mg/dL (ref 6–20)
CO2: 30 mmol/L (ref 22–32)
Calcium: 8.7 mg/dL — ABNORMAL LOW (ref 8.9–10.3)
Chloride: 102 mmol/L (ref 101–111)
Creatinine, Ser: 1.17 mg/dL — ABNORMAL HIGH (ref 0.44–1.00)
GFR calc Af Amer: 60 mL/min — ABNORMAL LOW (ref >60)
GFR calc non Af Amer: 51 mL/min — ABNORMAL LOW (ref >60)
Glucose, Bld: 105 mg/dL — ABNORMAL HIGH (ref 65–99)
Potassium: 3.1 mmol/L — ABNORMAL LOW (ref 3.5–5.1)
Sodium: 138 mmol/L (ref 135–145)

## 2017-04-18 ENCOUNTER — Other Ambulatory Visit (HOSPITAL_COMMUNITY)
Admission: RE | Admit: 2017-04-18 | Discharge: 2017-04-18 | Disposition: A | Discharge status: Home / Self Care | Payer: No Typology Code available for payment source | Source: Skilled Nursing Facility | Attending: Internal Medicine | Admitting: Internal Medicine

## 2017-04-18 DIAGNOSIS — I10 Essential (primary) hypertension: Secondary | ICD-10-CM | POA: Insufficient documentation

## 2017-04-18 LAB — CBC WITH DIFFERENTIAL/PLATELET
Basophils Absolute: 0 10*3/uL (ref 0.0–0.1)
Basophils Relative: 1 %
Eosinophils Absolute: 0.2 10*3/uL (ref 0.0–0.7)
Eosinophils Relative: 4 %
HCT: 32.5 % — ABNORMAL LOW (ref 36.0–46.0)
Hemoglobin: 10.3 g/dL — ABNORMAL LOW (ref 12.0–15.0)
Lymphocytes Relative: 18 %
Lymphs Abs: 1 10*3/uL (ref 0.7–4.0)
MCH: 29.4 pg (ref 26.0–34.0)
MCHC: 31.7 g/dL (ref 30.0–36.0)
MCV: 92.9 fL (ref 78.0–100.0)
Monocytes Absolute: 0.5 10*3/uL (ref 0.1–1.0)
Monocytes Relative: 9 %
Neutro Abs: 4 10*3/uL (ref 1.7–7.7)
Neutrophils Relative %: 68 %
Platelets: 247 10*3/uL (ref 150–400)
RBC: 3.5 MIL/uL — ABNORMAL LOW (ref 3.87–5.11)
RDW: 17.3 % — ABNORMAL HIGH (ref 11.5–15.5)
WBC: 5.8 10*3/uL (ref 4.0–10.5)

## 2017-04-18 LAB — BASIC METABOLIC PANEL WITH GFR
Anion gap: 8 (ref 5–15)
BUN: 17 mg/dL (ref 6–20)
CO2: 27 mmol/L (ref 22–32)
Calcium: 8.8 mg/dL — ABNORMAL LOW (ref 8.9–10.3)
Chloride: 101 mmol/L (ref 101–111)
Creatinine, Ser: 1.23 mg/dL — ABNORMAL HIGH (ref 0.44–1.00)
GFR calc Af Amer: 56 mL/min — ABNORMAL LOW
GFR calc non Af Amer: 48 mL/min — ABNORMAL LOW
Glucose, Bld: 93 mg/dL (ref 65–99)
Potassium: 3.3 mmol/L — ABNORMAL LOW (ref 3.5–5.1)
Sodium: 136 mmol/L (ref 135–145)

## 2017-04-18 LAB — MAGNESIUM: Magnesium: 2 mg/dL (ref 1.7–2.4)

## 2017-04-19 ENCOUNTER — Other Ambulatory Visit (HOSPITAL_COMMUNITY)
Admission: RE | Admit: 2017-04-19 | Discharge: 2017-04-19 | Disposition: A | Discharge status: Home / Self Care | Payer: No Typology Code available for payment source | Source: Skilled Nursing Facility | Attending: Internal Medicine | Admitting: Internal Medicine

## 2017-04-19 DIAGNOSIS — I5032 Chronic diastolic (congestive) heart failure: Secondary | ICD-10-CM | POA: Insufficient documentation

## 2017-04-19 LAB — BASIC METABOLIC PANEL
Anion gap: 8 (ref 5–15)
BUN: 16 mg/dL (ref 6–20)
CO2: 27 mmol/L (ref 22–32)
Calcium: 9 mg/dL (ref 8.9–10.3)
Chloride: 102 mmol/L (ref 101–111)
Creatinine, Ser: 1.24 mg/dL — ABNORMAL HIGH (ref 0.44–1.00)
GFR calc Af Amer: 56 mL/min — ABNORMAL LOW (ref >60)
GFR calc non Af Amer: 48 mL/min — ABNORMAL LOW (ref >60)
Glucose, Bld: 98 mg/dL (ref 65–99)
Potassium: 3.5 mmol/L (ref 3.5–5.1)
Sodium: 137 mmol/L (ref 135–145)

## 2017-04-22 ENCOUNTER — Encounter (HOSPITAL_COMMUNITY)
Admission: RE | Admit: 2017-04-22 | Discharge: 2017-04-22 | Disposition: A | Discharge status: Home / Self Care | Payer: Medicare Other | Source: Skilled Nursing Facility | Attending: *Deleted | Admitting: *Deleted

## 2017-04-22 ENCOUNTER — Other Ambulatory Visit: Payer: Self-pay

## 2017-04-22 LAB — BASIC METABOLIC PANEL
Anion gap: 8 (ref 5–15)
BUN: 17 mg/dL (ref 6–20)
CO2: 28 mmol/L (ref 22–32)
Calcium: 9.5 mg/dL (ref 8.9–10.3)
Chloride: 101 mmol/L (ref 101–111)
Creatinine, Ser: 1.15 mg/dL — ABNORMAL HIGH (ref 0.44–1.00)
GFR calc Af Amer: >60 mL/min (ref >60)
GFR calc non Af Amer: 53 mL/min — ABNORMAL LOW (ref >60)
Glucose, Bld: 97 mg/dL (ref 65–99)
Potassium: 3.8 mmol/L (ref 3.5–5.1)
Sodium: 137 mmol/L (ref 135–145)

## 2017-04-22 MED ORDER — HYDROCODONE-ACETAMINOPHEN 5-325 MG PO TABS: 1.0000 | ORAL_TABLET | Freq: Four times a day (QID) | ORAL | 0 refills | Status: DC | PRN

## 2017-04-22 NOTE — Telephone Encounter (Signed)
RX faxed to [facility] @ [phone]. Phone number [phone]  

## 2017-04-25 ENCOUNTER — Non-Acute Institutional Stay (SKILLED_NURSING_FACILITY): Payer: Medicare Other | Admitting: Internal Medicine

## 2017-04-25 ENCOUNTER — Encounter: Payer: Self-pay | Admitting: Internal Medicine

## 2017-04-25 DIAGNOSIS — I5031 Acute diastolic (congestive) heart failure: Secondary | ICD-10-CM | POA: Diagnosis not present

## 2017-04-25 DIAGNOSIS — N179 Acute kidney failure, unspecified: Secondary | ICD-10-CM

## 2017-04-25 DIAGNOSIS — I83019 Varicose veins of right lower extremity with ulcer of unspecified site: Secondary | ICD-10-CM

## 2017-04-25 DIAGNOSIS — I10 Essential (primary) hypertension: Secondary | ICD-10-CM | POA: Diagnosis not present

## 2017-04-25 DIAGNOSIS — L97919 Non-pressure chronic ulcer of unspecified part of right lower leg with unspecified severity: Secondary | ICD-10-CM

## 2017-04-25 DIAGNOSIS — L97929 Non-pressure chronic ulcer of unspecified part of left lower leg with unspecified severity: Secondary | ICD-10-CM

## 2017-04-25 DIAGNOSIS — E876 Hypokalemia: Secondary | ICD-10-CM | POA: Diagnosis not present

## 2017-04-25 DIAGNOSIS — I83029 Varicose veins of left lower extremity with ulcer of unspecified site: Secondary | ICD-10-CM | POA: Diagnosis not present

## 2017-04-25 NOTE — Progress Notes (Signed)
Location:   [facility] Room Number: 153/P Place of Service:  SNF (31)  Provider: Granville Lewis  PCP: Rosita Fire, MD Patient Care Team: Rosita Fire, MD as PCP - General (Internal Medicine)  Extended Emergency Contact Information Primary Emergency Contact: Franki Monte of [place] Mobile Phone: [phone] Relation: Brother Secondary Emergency Contact: Kalman Jewels [place] of [place] Mobile Phone: [phone] Relation: None  Code Status: Full Code Goals of care:  Advanced Directive information Advanced Directives 04/25/2017  Does Patient Have a Medical Advance Directive? Yes  Type of Advance Directive (No Data)  Does patient want to make changes to medical advance directive? No - Patient declined  Would patient like information on creating a medical advance directive? No - Patient declined  Pre-existing out of facility DNR order (yellow form or pink MOST form) -     No Known Allergies  Chief Complaint  Patient presents with  . Discharge Note    HPI:  56 y.o. female  for discharge from facility later this week.  She was here for therapy and wound care with a history of chronic venous stasis ulcers in both her legs.  She had been followed by home health but had increased drainage from the wounds was admitted the hospital and started on vancomycin and Zosyn-blood cultures were negative she was transitioned to Zosyn and was discharged to skilled nursing on Zosyn.  She has completed the antibiotic-per wound care wounds are looking improved she will need continued follow-up of wound care at home. She will be living with her sister who is very supportive and actually is in the room with her today.  She also had acute renal insufficiency in the hospital with creatinine up to-2.23-this assess normalized it was 1.15 on lab done 04/22/2017.  She did have significant hypokalemia with potassium of 2.8 during her stay here this has  been aggressively supplemented she's currently on potassium 60 mEq a day potassium was 3.8 on April 30 will update this tomorrow before discharge.  The hospital that was an incidental finding of a large left ovarian cyst this was evaluated by gynecology and recommendation is for surgery when she is somewhat more stable.  Currently her only complaint is she is having some allergy symptoms runny eyes does not complain of increased cough or shortness of breath would like something for what she describes as seasonal allergies.        Past Medical History:  Diagnosis Date  . Asthma   . CHF (congestive heart failure) ([place]) 03/12/2014  . Depression   . Headache(784.0)   . Hyperlipidemia   . Hypertension   . Lung nodules   . Renal disorder   . Sarcoidosis   . Ulcer    left posterior calf    Past Surgical History:  Procedure Laterality Date  . cataract surgery Left 07-2013  . NO PAST SURGERIES        reports that she has never smoked. She has never used smokeless tobacco. She reports that she does not drink alcohol or use drugs. Social History   Social History  . Marital status: Single    Spouse name: N/A  . Number of children: 1  . Years of education: N/A   Occupational History  . Not on file.   Social History Main Topics  . Smoking status: Never Smoker  . Smokeless tobacco: Never Used  . Alcohol use No  . Drug use: No  . Sexual activity: Not Currently   Other Topics  Concern  . Not on file   Social History Narrative   Lives alone.   Functional Status Survey:    No Known Allergies  Pertinent  Health Maintenance Due  Topic Date Due  . PAP SMEAR  02/09/1983  . MAMMOGRAM  02/10/2012  . COLONOSCOPY  02/10/2012  . INFLUENZA VACCINE  07/24/2017    Medications: Outpatient Encounter Prescriptions as of 04/25/2017  Medication Sig  . acetaminophen (TYLENOL) 325 MG tablet Take 2 tablets (650 mg total) by mouth every 6 (six) hours as needed for mild pain (or Fever  >/= 101).  Marland Kitchen albuterol (PROVENTIL HFA;VENTOLIN HFA) 108 (90 BASE) MCG/ACT inhaler Inhale 2 puffs into the lungs every 6 (six) hours as needed for shortness of breath.  . Amino Acids-Protein Hydrolys (FEEDING SUPPLEMENT, PRO-STAT SUGAR FREE 64,) LIQD Take 60 mLs by mouth 2 (two) times daily between meals.  Marland Kitchen aspirin EC 81 MG tablet Take 81 mg by mouth daily.  . furosemide (LASIX) 40 MG tablet Take 40 mg by mouth daily.  Marland Kitchen HYDROcodone-acetaminophen (NORCO/VICODIN) 5-325 MG tablet Take 1-2 tablets by mouth every 6 (six) hours as needed for moderate pain.  . Multiple Vitamin (MULTIVITAMIN WITH MINERALS) TABS tablet Take 1 tablet by mouth daily.  . ondansetron (ZOFRAN) 4 MG tablet Take 1 tablet (4 mg total) by mouth every 6 (six) hours as needed for nausea.  . piperacillin-tazobactam (ZOSYN) 3.375 GM/50ML IVPB Inject 50 mLs (3.375 g total) into the vein every 8 (eight) hours.  . potassium chloride SA (K-DUR,KLOR-CON) 20 MEQ tablet Take 1 tablet (20 mEq total) by mouth daily.   No facility-administered encounter medications on file as of 04/25/2017.   .med  Review of Systems  In general no complaints of fever or chills.  Skin does not complain of rashes or itching she does have chronic venous stasis wounds treated by wound care and apparently hasn't proved significantly areas are currently wrapped.  Head ears eyes nose mouth and throat does not plan to visual changes or sore throat.  Respiratory not complaining of any shortness breath or cough.  Head ears eyes nose mouth and throat does complain of some nasal drainageeyeI drainage with some history of allergies.  Cardiac does not complaining of any chest pain does have some chronic venous stasis changes-edema.  GI is not complaining of a nausea vomiting diarrhea constipation or abdominal pain.  GU does not complain of dysuria does have history of ovarian cyst is noted above.  Neurologic is not complaining of any dizziness headache  syncopal-type feelings.  Psych does not complain of any depression or anxiety appears to be in good spirits     Vitals:   04/25/17 1443  BP: 126/82  Pulse: 84  Resp: 20  Temp: 97.6 F (36.4 C)  TempSrc: Oral   Temperature is 98.0 pulse 84 respirations 20 blood pressure 120/52 weight appears to be stable at 290.5 pounds Physical Exam  In general this is an obese middle-aged female in no distress sitting comfortably in her chair.  Her skin is warm and dry she does have wrapping of her lower legs bilaterally with a history of venous stasis wounds which again per wound care have improved with no sign of infection.  Eyes pupils appear reactive light sclerae and conjunctiva are clear there is some clear drainage.  Nose possibly a small amount of clear drainage.  Oropharynx clear mucous membranes moist.  Chest is clear to auscultation there is no labored breathing.  Heart is regular rate and rhythm  with occasional irregular beat there is a slight 1 to 2/6 systolic murmur she has chronic venous stasis changes edema of her feet legs bilaterally.  Abdomen is obese soft nontender with positive bowel sounds.  Musculoskeletal is able to move all extremities 4 with some lower extremity weakness complicated again with her history of leg wounds.  Neurologic is grossly intact could not really appreciate any lateralizing findings her speech is clear.  Neurologic is grossly intact her speech is clear no lateralizing findings.  Psych she is alert and oriented pleasant and appropriate  Labs reviewed: Basic Metabolic Panel:  Recent Labs  04/02/17 0632  04/03/17 2355 04/05/17 0622 04/06/17 0637  04/18/17 0420 04/19/17 0425 04/22/17 0900  NA  --   < > 138 135 138  < > 136 137 137  K  --   < > 3.6 4.7 4.6  < > 3.3* 3.5 3.8  CL  --   < > 110 111 112*  < > 101 102 101  CO2  --   < > 20* 20* 22  < > 27 27 28   GLUCOSE  --   < > 116* 92 97  < > 93 98 97  BUN  --   < > 19 17 15   < > 17  16 17   CREATININE  --   < > 1.57* 1.35* 1.13*  < > 1.23* 1.24* 1.15*  CALCIUM  --   < > 8.4* 8.7* 8.8*  < > 8.8* 9.0 9.5  MG 2.2  --  2.2  --   --   --  2.0  --   --   PHOS  --   --   --  2.3* 2.6  --   --   --   --   < > = values in this interval not displayed. Liver Function Tests:  Recent Labs  12/18/16 1038 04/01/17 2145 04/02/17 0604 04/05/17 0622 04/06/17 0637  AST 10* 109* 89*  --   --   ALT 8* 30 25  --   --   ALKPHOS 36* 43 34*  --   --   BILITOT 0.3 0.9 0.8  --   --   PROT 7.1 7.9 6.6  --   --   ALBUMIN 2.2* 2.8* 2.2* 2.1* 2.1*   No results for input(s): LIPASE, AMYLASE in the last 8760 hours. No results for input(s): AMMONIA in the last 8760 hours. CBC:  Recent Labs  04/01/17 2145  04/06/17 0637 04/11/17 0700 04/18/17 0420  WBC 12.7*  < > 8.2 5.6 5.8  NEUTROABS 10.4*  --   --  3.3 4.0  HGB 9.7*  < > 9.8* 9.6* 10.3*  HCT 29.7*  < > 31.6* 30.6* 32.5*  MCV 87.4  < > 92.4 92.2 92.9  PLT 313  < > 248 223 247  < > = values in this interval not displayed. Cardiac Enzymes:  Recent Labs  12/17/16 2055 12/18/16 0034 12/18/16 0619  TROPONINI <0.03 <0.03 <0.03   BNP: Invalid input(s): POCBNP CBG:  Recent Labs  12/18/16 2009 12/19/16 0717 12/19/16 1126  GLUCAP 121* 119* 112*    Procedures and Imaging Studies During Stay: Ct Abdomen Pelvis Wo Contrast  Result Date: 04/01/2017 CLINICAL DATA:  Acute onset of worsening generalized weakness. Disorientation and dehydration. Increasing drainage from chronic skin wounds, with generalized abdominal pain and distention. Initial encounter. EXAM: CT ABDOMEN AND PELVIS WITHOUT CONTRAST TECHNIQUE: Multidetector CT imaging of the abdomen and pelvis was performed following  the standard protocol without IV contrast. COMPARISON:  Renal ultrasound performed 12/18/2016 FINDINGS: Lower chest: Mild bibasilar atelectasis noted. The heart is borderline normal in size. Hepatobiliary: The liver is unremarkable in appearance. The  gallbladder is difficult to fully assess due to motion artifact, but appears grossly unremarkable. The common bile duct remains normal in caliber. Pancreas: The pancreas is within normal limits. Spleen: The spleen is unremarkable in appearance. Adrenals/Urinary Tract: The adrenal glands are unremarkable in appearance. The kidneys are within normal limits. There is no evidence of hydronephrosis. No renal or ureteral stones are identified. No perinephric stranding is seen. Stomach/Bowel: The stomach is unremarkable in appearance. The small bowel is within normal limits. The appendix is not well characterized but appears grossly normal in caliber, without evidence of appendicitis. The colon is unremarkable in appearance. Vascular/Lymphatic: The abdominal aorta is unremarkable in appearance. The inferior vena cava is grossly unremarkable. No retroperitoneal lymphadenopathy is seen. No pelvic sidewall lymphadenopathy is identified. Reproductive: The bladder is moderately distended and grossly unremarkable. The uterus is grossly unremarkable. Nabothian cysts are noted at the cervix. The right ovary is grossly unremarkable. There is a very large 31.2 x 26.1 x 19.8 cm cystic mass filling much of the abdomen. Given its appearance, this more likely arises from the left ovary. Other: Mild skin thickening is noted along the patient's pannus. Known chronic skin wounds are difficult fully characterize. Musculoskeletal: No acute osseous abnormalities are identified. The visualized musculature is unremarkable in appearance. IMPRESSION: 1. Very large 31.2 x 26.1 x 19.8 cm cystic mass filling much of the abdomen. Given its appearance, this more likely arises from the left ovary. This explains the patient's worsening abdominal distention. Surgical consultation is recommended for removal of the cystic mass. 2. Mild bibasilar atelectasis noted. 3. Mild skin thickening along the patient's pannus. Electronically Signed   By: Garald Balding M.D.   On: 04/01/2017 23:12   Dg Chest Port 1 View  Result Date: 04/01/2017 CLINICAL DATA:  Weakness that began yesterday. Cellulitis. Sarcoidosis. Morbid obesity. EXAM: PORTABLE CHEST 1 VIEW COMPARISON:  Most recent chest radiograph 12/18/2016. Most recent CT chest 10/08/2013. FINDINGS: Cardiomegaly. Normal mediastinal contours. No active infiltrates or failure. Unusual air shadow under the RIGHT hemidiaphragm could simply represent colon interposition although on prior chest CT, and on prior chest radiographs, the colon is not interposed above the liver. In the appropriate setting, the findings could represent pneumoperitoneum. Air-filled splenic flexure appears unremarkable, under the LEFT hemidiaphragm. No osseous findings. IMPRESSION: Cannot confidently exclude free air under the RIGHT hemidiaphragm, although colonic interposition above the liver could certainly have this appearance. Recommend noncontrast CT scan of the abdomen pelvis for further evaluation. Findings discussed with ordering provider. Cardiomegaly without active infiltrates or failure. Electronically Signed   By: Staci Righter M.D.   On: 04/01/2017 21:45    Assessment/Plan:    #1 history of venous stasis ulcers bilaterally lower extremities these have improved she is receiving Santyl topical and is followed closely by wound care she will need to support as well as home with follow-up by primary care provider she will need PT OT for further strengthening with her lower extremity weakness as well as nursing support again with her wounds also will need CNA to help with her activities of daily living-there is consideration also for a rolling walker and therapy will need to weigh in on this   Her pain appears to be under better control she does receive Vicodin as needed for pain.  She  has refused referral to wound Center and requested advanced home health only for wound care treatment again this will have to be followed  closely.  #2-hypertension this appears to be stable she is on Lasix 40 mg a day recent blood pressures 126/52 114/65-112/72-I see one listed as 87/61 but I suspect this is probably a machine variation  #3 history of diastolic CHF with ejection fraction 55% again she is on Lasix 40 mg a day with potassium supplementation 60 mEq today currently-her weight appears to be relatively stable no complaints of shortness of breath or increased cough.  #4 history of acute renal failure in the hospital again this normalized most recent creatinine 1.15 BUN of 17-will update this before discharge mainly because she is had some low potassium in the past.  #5 in regards hypokalemia she is on aggressive supplementation 60 mEq a day will update this most recent potassium was 3.8 on 04/22/2017.  #6 history of suspected allergic rhinitis will do a short course of Claritin 10 mg a day for 5 days and monitor she does not show acute respiratory issues i.e. cough or shortness of breath -- more confined it appears to her eyes nose   Again she will need continued PT and OT for strengthening as well as home health support with close follow-up of her wounds again she has refused referral to wound Center.  She will be going home with her sister was very supportive she also will need a CNA to help with her activities of daily living and possibly a Rollator as well to help with ambulation with her lower extremity weakness.  She will need expedient follow-up by her primary care provider as well.  [id number] note greater than 30 minutes spent on this discharge summary-greater than 50% of time spent coordinating plan of care for numerous diagnoses

## 2017-04-26 ENCOUNTER — Encounter (HOSPITAL_COMMUNITY)
Admission: RE | Admit: 2017-04-26 | Discharge: 2017-04-26 | Disposition: A | Discharge status: Home / Self Care | Payer: Medicare Other | Source: Skilled Nursing Facility | Attending: Internal Medicine | Admitting: Internal Medicine

## 2017-04-26 DIAGNOSIS — I83002 Varicose veins of unspecified lower extremity with ulcer of calf: Secondary | ICD-10-CM | POA: Insufficient documentation

## 2017-04-26 DIAGNOSIS — L97209 Non-pressure chronic ulcer of unspecified calf with unspecified severity: Secondary | ICD-10-CM | POA: Insufficient documentation

## 2017-04-26 LAB — BASIC METABOLIC PANEL
Anion gap: 7 (ref 5–15)
BUN: 18 mg/dL (ref 6–20)
CO2: 27 mmol/L (ref 22–32)
Calcium: 9.4 mg/dL (ref 8.9–10.3)
Chloride: 103 mmol/L (ref 101–111)
Creatinine, Ser: 1.17 mg/dL — ABNORMAL HIGH (ref 0.44–1.00)
GFR calc Af Amer: 60 mL/min — ABNORMAL LOW (ref >60)
GFR calc non Af Amer: 51 mL/min — ABNORMAL LOW (ref >60)
Glucose, Bld: 100 mg/dL — ABNORMAL HIGH (ref 65–99)
Potassium: 3.6 mmol/L (ref 3.5–5.1)
Sodium: 137 mmol/L (ref 135–145)

## 2017-04-27 DIAGNOSIS — I5030 Unspecified diastolic (congestive) heart failure: Secondary | ICD-10-CM | POA: Diagnosis not present

## 2017-04-27 DIAGNOSIS — L97222 Non-pressure chronic ulcer of left calf with fat layer exposed: Secondary | ICD-10-CM | POA: Diagnosis not present

## 2017-04-27 DIAGNOSIS — I110 Hypertensive heart disease with heart failure: Secondary | ICD-10-CM | POA: Diagnosis not present

## 2017-04-27 DIAGNOSIS — E876 Hypokalemia: Secondary | ICD-10-CM | POA: Diagnosis not present

## 2017-04-27 DIAGNOSIS — D869 Sarcoidosis, unspecified: Secondary | ICD-10-CM | POA: Diagnosis not present

## 2017-04-27 DIAGNOSIS — Z4800 Encounter for change or removal of nonsurgical wound dressing: Secondary | ICD-10-CM | POA: Diagnosis not present

## 2017-04-27 DIAGNOSIS — F329 Major depressive disorder, single episode, unspecified: Secondary | ICD-10-CM | POA: Diagnosis not present

## 2017-04-27 DIAGNOSIS — J45909 Unspecified asthma, uncomplicated: Secondary | ICD-10-CM | POA: Diagnosis not present

## 2017-04-27 DIAGNOSIS — L97822 Non-pressure chronic ulcer of other part of left lower leg with fat layer exposed: Secondary | ICD-10-CM | POA: Diagnosis not present

## 2017-04-27 DIAGNOSIS — I872 Venous insufficiency (chronic) (peripheral): Secondary | ICD-10-CM | POA: Diagnosis not present

## 2017-04-29 DIAGNOSIS — E876 Hypokalemia: Secondary | ICD-10-CM | POA: Diagnosis not present

## 2017-04-29 DIAGNOSIS — L97822 Non-pressure chronic ulcer of other part of left lower leg with fat layer exposed: Secondary | ICD-10-CM | POA: Diagnosis not present

## 2017-04-29 DIAGNOSIS — I110 Hypertensive heart disease with heart failure: Secondary | ICD-10-CM | POA: Diagnosis not present

## 2017-04-29 DIAGNOSIS — L97222 Non-pressure chronic ulcer of left calf with fat layer exposed: Secondary | ICD-10-CM | POA: Diagnosis not present

## 2017-04-29 DIAGNOSIS — I872 Venous insufficiency (chronic) (peripheral): Secondary | ICD-10-CM | POA: Diagnosis not present

## 2017-04-29 DIAGNOSIS — I5030 Unspecified diastolic (congestive) heart failure: Secondary | ICD-10-CM | POA: Diagnosis not present

## 2017-05-01 DIAGNOSIS — I110 Hypertensive heart disease with heart failure: Secondary | ICD-10-CM | POA: Diagnosis not present

## 2017-05-01 DIAGNOSIS — I5030 Unspecified diastolic (congestive) heart failure: Secondary | ICD-10-CM | POA: Diagnosis not present

## 2017-05-01 DIAGNOSIS — L97222 Non-pressure chronic ulcer of left calf with fat layer exposed: Secondary | ICD-10-CM | POA: Diagnosis not present

## 2017-05-01 DIAGNOSIS — E876 Hypokalemia: Secondary | ICD-10-CM | POA: Diagnosis not present

## 2017-05-01 DIAGNOSIS — L97822 Non-pressure chronic ulcer of other part of left lower leg with fat layer exposed: Secondary | ICD-10-CM | POA: Diagnosis not present

## 2017-05-01 DIAGNOSIS — I872 Venous insufficiency (chronic) (peripheral): Secondary | ICD-10-CM | POA: Diagnosis not present

## 2017-05-02 DIAGNOSIS — L97222 Non-pressure chronic ulcer of left calf with fat layer exposed: Secondary | ICD-10-CM | POA: Diagnosis not present

## 2017-05-02 DIAGNOSIS — I5030 Unspecified diastolic (congestive) heart failure: Secondary | ICD-10-CM | POA: Diagnosis not present

## 2017-05-02 DIAGNOSIS — I110 Hypertensive heart disease with heart failure: Secondary | ICD-10-CM | POA: Diagnosis not present

## 2017-05-02 DIAGNOSIS — E876 Hypokalemia: Secondary | ICD-10-CM | POA: Diagnosis not present

## 2017-05-02 DIAGNOSIS — I872 Venous insufficiency (chronic) (peripheral): Secondary | ICD-10-CM | POA: Diagnosis not present

## 2017-05-02 DIAGNOSIS — L97822 Non-pressure chronic ulcer of other part of left lower leg with fat layer exposed: Secondary | ICD-10-CM | POA: Diagnosis not present

## 2017-05-03 DIAGNOSIS — I5030 Unspecified diastolic (congestive) heart failure: Secondary | ICD-10-CM | POA: Diagnosis not present

## 2017-05-03 DIAGNOSIS — I872 Venous insufficiency (chronic) (peripheral): Secondary | ICD-10-CM | POA: Diagnosis not present

## 2017-05-03 DIAGNOSIS — I110 Hypertensive heart disease with heart failure: Secondary | ICD-10-CM | POA: Diagnosis not present

## 2017-05-03 DIAGNOSIS — E876 Hypokalemia: Secondary | ICD-10-CM | POA: Diagnosis not present

## 2017-05-03 DIAGNOSIS — L97822 Non-pressure chronic ulcer of other part of left lower leg with fat layer exposed: Secondary | ICD-10-CM | POA: Diagnosis not present

## 2017-05-03 DIAGNOSIS — L97222 Non-pressure chronic ulcer of left calf with fat layer exposed: Secondary | ICD-10-CM | POA: Diagnosis not present

## 2017-05-06 ENCOUNTER — Encounter (HOSPITAL_BASED_OUTPATIENT_CLINIC_OR_DEPARTMENT_OTHER): Payer: Medicare Other

## 2017-05-06 DIAGNOSIS — L97222 Non-pressure chronic ulcer of left calf with fat layer exposed: Secondary | ICD-10-CM | POA: Diagnosis not present

## 2017-05-06 DIAGNOSIS — E876 Hypokalemia: Secondary | ICD-10-CM | POA: Diagnosis not present

## 2017-05-06 DIAGNOSIS — I5030 Unspecified diastolic (congestive) heart failure: Secondary | ICD-10-CM | POA: Diagnosis not present

## 2017-05-06 DIAGNOSIS — I872 Venous insufficiency (chronic) (peripheral): Secondary | ICD-10-CM | POA: Diagnosis not present

## 2017-05-06 DIAGNOSIS — L97822 Non-pressure chronic ulcer of other part of left lower leg with fat layer exposed: Secondary | ICD-10-CM | POA: Diagnosis not present

## 2017-05-06 DIAGNOSIS — I110 Hypertensive heart disease with heart failure: Secondary | ICD-10-CM | POA: Diagnosis not present

## 2017-05-08 DIAGNOSIS — L97822 Non-pressure chronic ulcer of other part of left lower leg with fat layer exposed: Secondary | ICD-10-CM | POA: Diagnosis not present

## 2017-05-08 DIAGNOSIS — L97222 Non-pressure chronic ulcer of left calf with fat layer exposed: Secondary | ICD-10-CM | POA: Diagnosis not present

## 2017-05-08 DIAGNOSIS — I872 Venous insufficiency (chronic) (peripheral): Secondary | ICD-10-CM | POA: Diagnosis not present

## 2017-05-08 DIAGNOSIS — I110 Hypertensive heart disease with heart failure: Secondary | ICD-10-CM | POA: Diagnosis not present

## 2017-05-08 DIAGNOSIS — I5030 Unspecified diastolic (congestive) heart failure: Secondary | ICD-10-CM | POA: Diagnosis not present

## 2017-05-08 DIAGNOSIS — E876 Hypokalemia: Secondary | ICD-10-CM | POA: Diagnosis not present

## 2017-05-10 DIAGNOSIS — L97822 Non-pressure chronic ulcer of other part of left lower leg with fat layer exposed: Secondary | ICD-10-CM | POA: Diagnosis not present

## 2017-05-10 DIAGNOSIS — I872 Venous insufficiency (chronic) (peripheral): Secondary | ICD-10-CM | POA: Diagnosis not present

## 2017-05-10 DIAGNOSIS — L97222 Non-pressure chronic ulcer of left calf with fat layer exposed: Secondary | ICD-10-CM | POA: Diagnosis not present

## 2017-05-10 DIAGNOSIS — I110 Hypertensive heart disease with heart failure: Secondary | ICD-10-CM | POA: Diagnosis not present

## 2017-05-10 DIAGNOSIS — I5030 Unspecified diastolic (congestive) heart failure: Secondary | ICD-10-CM | POA: Diagnosis not present

## 2017-05-10 DIAGNOSIS — E876 Hypokalemia: Secondary | ICD-10-CM | POA: Diagnosis not present

## 2017-05-13 DIAGNOSIS — I110 Hypertensive heart disease with heart failure: Secondary | ICD-10-CM | POA: Diagnosis not present

## 2017-05-13 DIAGNOSIS — L97222 Non-pressure chronic ulcer of left calf with fat layer exposed: Secondary | ICD-10-CM | POA: Diagnosis not present

## 2017-05-13 DIAGNOSIS — I5030 Unspecified diastolic (congestive) heart failure: Secondary | ICD-10-CM | POA: Diagnosis not present

## 2017-05-13 DIAGNOSIS — I872 Venous insufficiency (chronic) (peripheral): Secondary | ICD-10-CM | POA: Diagnosis not present

## 2017-05-13 DIAGNOSIS — E876 Hypokalemia: Secondary | ICD-10-CM | POA: Diagnosis not present

## 2017-05-13 DIAGNOSIS — L97822 Non-pressure chronic ulcer of other part of left lower leg with fat layer exposed: Secondary | ICD-10-CM | POA: Diagnosis not present

## 2017-05-15 DIAGNOSIS — I5030 Unspecified diastolic (congestive) heart failure: Secondary | ICD-10-CM | POA: Diagnosis not present

## 2017-05-15 DIAGNOSIS — L97222 Non-pressure chronic ulcer of left calf with fat layer exposed: Secondary | ICD-10-CM | POA: Diagnosis not present

## 2017-05-15 DIAGNOSIS — L97822 Non-pressure chronic ulcer of other part of left lower leg with fat layer exposed: Secondary | ICD-10-CM | POA: Diagnosis not present

## 2017-05-15 DIAGNOSIS — I872 Venous insufficiency (chronic) (peripheral): Secondary | ICD-10-CM | POA: Diagnosis not present

## 2017-05-15 DIAGNOSIS — E876 Hypokalemia: Secondary | ICD-10-CM | POA: Diagnosis not present

## 2017-05-15 DIAGNOSIS — I110 Hypertensive heart disease with heart failure: Secondary | ICD-10-CM | POA: Diagnosis not present

## 2017-05-16 DIAGNOSIS — I5030 Unspecified diastolic (congestive) heart failure: Secondary | ICD-10-CM | POA: Diagnosis not present

## 2017-05-16 DIAGNOSIS — I872 Venous insufficiency (chronic) (peripheral): Secondary | ICD-10-CM | POA: Diagnosis not present

## 2017-05-16 DIAGNOSIS — L97222 Non-pressure chronic ulcer of left calf with fat layer exposed: Secondary | ICD-10-CM | POA: Diagnosis not present

## 2017-05-16 DIAGNOSIS — L97822 Non-pressure chronic ulcer of other part of left lower leg with fat layer exposed: Secondary | ICD-10-CM | POA: Diagnosis not present

## 2017-05-16 DIAGNOSIS — I110 Hypertensive heart disease with heart failure: Secondary | ICD-10-CM | POA: Diagnosis not present

## 2017-05-16 DIAGNOSIS — E876 Hypokalemia: Secondary | ICD-10-CM | POA: Diagnosis not present

## 2017-05-21 DIAGNOSIS — E876 Hypokalemia: Secondary | ICD-10-CM | POA: Diagnosis not present

## 2017-05-21 DIAGNOSIS — I872 Venous insufficiency (chronic) (peripheral): Secondary | ICD-10-CM | POA: Diagnosis not present

## 2017-05-21 DIAGNOSIS — L03115 Cellulitis of right lower limb: Secondary | ICD-10-CM | POA: Diagnosis not present

## 2017-05-21 DIAGNOSIS — Z1389 Encounter for screening for other disorder: Secondary | ICD-10-CM | POA: Diagnosis not present

## 2017-05-21 DIAGNOSIS — L97222 Non-pressure chronic ulcer of left calf with fat layer exposed: Secondary | ICD-10-CM | POA: Diagnosis not present

## 2017-05-21 DIAGNOSIS — E785 Hyperlipidemia, unspecified: Secondary | ICD-10-CM | POA: Diagnosis not present

## 2017-05-21 DIAGNOSIS — L97822 Non-pressure chronic ulcer of other part of left lower leg with fat layer exposed: Secondary | ICD-10-CM | POA: Diagnosis not present

## 2017-05-21 DIAGNOSIS — I110 Hypertensive heart disease with heart failure: Secondary | ICD-10-CM | POA: Diagnosis not present

## 2017-05-21 DIAGNOSIS — I5030 Unspecified diastolic (congestive) heart failure: Secondary | ICD-10-CM | POA: Diagnosis not present

## 2017-05-21 DIAGNOSIS — L97904 Non-pressure chronic ulcer of unspecified part of unspecified lower leg with necrosis of bone: Secondary | ICD-10-CM | POA: Diagnosis not present

## 2017-05-21 DIAGNOSIS — M1990 Unspecified osteoarthritis, unspecified site: Secondary | ICD-10-CM | POA: Diagnosis not present

## 2017-05-21 DIAGNOSIS — I10 Essential (primary) hypertension: Secondary | ICD-10-CM | POA: Diagnosis not present

## 2017-05-22 DIAGNOSIS — L97822 Non-pressure chronic ulcer of other part of left lower leg with fat layer exposed: Secondary | ICD-10-CM | POA: Diagnosis not present

## 2017-05-22 DIAGNOSIS — I5030 Unspecified diastolic (congestive) heart failure: Secondary | ICD-10-CM | POA: Diagnosis not present

## 2017-05-22 DIAGNOSIS — L97222 Non-pressure chronic ulcer of left calf with fat layer exposed: Secondary | ICD-10-CM | POA: Diagnosis not present

## 2017-05-22 DIAGNOSIS — E876 Hypokalemia: Secondary | ICD-10-CM | POA: Diagnosis not present

## 2017-05-22 DIAGNOSIS — I872 Venous insufficiency (chronic) (peripheral): Secondary | ICD-10-CM | POA: Diagnosis not present

## 2017-05-22 DIAGNOSIS — I110 Hypertensive heart disease with heart failure: Secondary | ICD-10-CM | POA: Diagnosis not present

## 2017-05-23 DIAGNOSIS — I110 Hypertensive heart disease with heart failure: Secondary | ICD-10-CM | POA: Diagnosis not present

## 2017-05-23 DIAGNOSIS — E876 Hypokalemia: Secondary | ICD-10-CM | POA: Diagnosis not present

## 2017-05-23 DIAGNOSIS — L97822 Non-pressure chronic ulcer of other part of left lower leg with fat layer exposed: Secondary | ICD-10-CM | POA: Diagnosis not present

## 2017-05-23 DIAGNOSIS — L97222 Non-pressure chronic ulcer of left calf with fat layer exposed: Secondary | ICD-10-CM | POA: Diagnosis not present

## 2017-05-23 DIAGNOSIS — I872 Venous insufficiency (chronic) (peripheral): Secondary | ICD-10-CM | POA: Diagnosis not present

## 2017-05-23 DIAGNOSIS — I5030 Unspecified diastolic (congestive) heart failure: Secondary | ICD-10-CM | POA: Diagnosis not present

## 2017-05-24 DIAGNOSIS — I5030 Unspecified diastolic (congestive) heart failure: Secondary | ICD-10-CM | POA: Diagnosis not present

## 2017-05-24 DIAGNOSIS — I110 Hypertensive heart disease with heart failure: Secondary | ICD-10-CM | POA: Diagnosis not present

## 2017-05-24 DIAGNOSIS — L97222 Non-pressure chronic ulcer of left calf with fat layer exposed: Secondary | ICD-10-CM | POA: Diagnosis not present

## 2017-05-24 DIAGNOSIS — I872 Venous insufficiency (chronic) (peripheral): Secondary | ICD-10-CM | POA: Diagnosis not present

## 2017-05-24 DIAGNOSIS — L97822 Non-pressure chronic ulcer of other part of left lower leg with fat layer exposed: Secondary | ICD-10-CM | POA: Diagnosis not present

## 2017-05-24 DIAGNOSIS — E876 Hypokalemia: Secondary | ICD-10-CM | POA: Diagnosis not present

## 2017-05-27 DIAGNOSIS — I110 Hypertensive heart disease with heart failure: Secondary | ICD-10-CM | POA: Diagnosis not present

## 2017-05-27 DIAGNOSIS — I872 Venous insufficiency (chronic) (peripheral): Secondary | ICD-10-CM | POA: Diagnosis not present

## 2017-05-27 DIAGNOSIS — E876 Hypokalemia: Secondary | ICD-10-CM | POA: Diagnosis not present

## 2017-05-27 DIAGNOSIS — L97222 Non-pressure chronic ulcer of left calf with fat layer exposed: Secondary | ICD-10-CM | POA: Diagnosis not present

## 2017-05-27 DIAGNOSIS — L97822 Non-pressure chronic ulcer of other part of left lower leg with fat layer exposed: Secondary | ICD-10-CM | POA: Diagnosis not present

## 2017-05-27 DIAGNOSIS — I5030 Unspecified diastolic (congestive) heart failure: Secondary | ICD-10-CM | POA: Diagnosis not present

## 2017-05-28 DIAGNOSIS — I110 Hypertensive heart disease with heart failure: Secondary | ICD-10-CM | POA: Diagnosis not present

## 2017-05-28 DIAGNOSIS — L97822 Non-pressure chronic ulcer of other part of left lower leg with fat layer exposed: Secondary | ICD-10-CM | POA: Diagnosis not present

## 2017-05-28 DIAGNOSIS — I5030 Unspecified diastolic (congestive) heart failure: Secondary | ICD-10-CM | POA: Diagnosis not present

## 2017-05-28 DIAGNOSIS — I872 Venous insufficiency (chronic) (peripheral): Secondary | ICD-10-CM | POA: Diagnosis not present

## 2017-05-28 DIAGNOSIS — E876 Hypokalemia: Secondary | ICD-10-CM | POA: Diagnosis not present

## 2017-05-28 DIAGNOSIS — L97222 Non-pressure chronic ulcer of left calf with fat layer exposed: Secondary | ICD-10-CM | POA: Diagnosis not present

## 2017-05-29 DIAGNOSIS — L97822 Non-pressure chronic ulcer of other part of left lower leg with fat layer exposed: Secondary | ICD-10-CM | POA: Diagnosis not present

## 2017-05-29 DIAGNOSIS — E876 Hypokalemia: Secondary | ICD-10-CM | POA: Diagnosis not present

## 2017-05-29 DIAGNOSIS — L97222 Non-pressure chronic ulcer of left calf with fat layer exposed: Secondary | ICD-10-CM | POA: Diagnosis not present

## 2017-05-29 DIAGNOSIS — I872 Venous insufficiency (chronic) (peripheral): Secondary | ICD-10-CM | POA: Diagnosis not present

## 2017-05-29 DIAGNOSIS — I5030 Unspecified diastolic (congestive) heart failure: Secondary | ICD-10-CM | POA: Diagnosis not present

## 2017-05-29 DIAGNOSIS — I110 Hypertensive heart disease with heart failure: Secondary | ICD-10-CM | POA: Diagnosis not present

## 2017-06-03 DIAGNOSIS — I872 Venous insufficiency (chronic) (peripheral): Secondary | ICD-10-CM | POA: Diagnosis not present

## 2017-06-03 DIAGNOSIS — L97822 Non-pressure chronic ulcer of other part of left lower leg with fat layer exposed: Secondary | ICD-10-CM | POA: Diagnosis not present

## 2017-06-03 DIAGNOSIS — I5030 Unspecified diastolic (congestive) heart failure: Secondary | ICD-10-CM | POA: Diagnosis not present

## 2017-06-03 DIAGNOSIS — I110 Hypertensive heart disease with heart failure: Secondary | ICD-10-CM | POA: Diagnosis not present

## 2017-06-03 DIAGNOSIS — E876 Hypokalemia: Secondary | ICD-10-CM | POA: Diagnosis not present

## 2017-06-03 DIAGNOSIS — L97222 Non-pressure chronic ulcer of left calf with fat layer exposed: Secondary | ICD-10-CM | POA: Diagnosis not present

## 2017-06-04 DIAGNOSIS — I872 Venous insufficiency (chronic) (peripheral): Secondary | ICD-10-CM | POA: Diagnosis not present

## 2017-06-04 DIAGNOSIS — I5030 Unspecified diastolic (congestive) heart failure: Secondary | ICD-10-CM | POA: Diagnosis not present

## 2017-06-04 DIAGNOSIS — E876 Hypokalemia: Secondary | ICD-10-CM | POA: Diagnosis not present

## 2017-06-04 DIAGNOSIS — I110 Hypertensive heart disease with heart failure: Secondary | ICD-10-CM | POA: Diagnosis not present

## 2017-06-04 DIAGNOSIS — L97822 Non-pressure chronic ulcer of other part of left lower leg with fat layer exposed: Secondary | ICD-10-CM | POA: Diagnosis not present

## 2017-06-04 DIAGNOSIS — L97222 Non-pressure chronic ulcer of left calf with fat layer exposed: Secondary | ICD-10-CM | POA: Diagnosis not present

## 2017-06-05 DIAGNOSIS — I5030 Unspecified diastolic (congestive) heart failure: Secondary | ICD-10-CM | POA: Diagnosis not present

## 2017-06-05 DIAGNOSIS — I872 Venous insufficiency (chronic) (peripheral): Secondary | ICD-10-CM | POA: Diagnosis not present

## 2017-06-05 DIAGNOSIS — L97222 Non-pressure chronic ulcer of left calf with fat layer exposed: Secondary | ICD-10-CM | POA: Diagnosis not present

## 2017-06-05 DIAGNOSIS — E876 Hypokalemia: Secondary | ICD-10-CM | POA: Diagnosis not present

## 2017-06-05 DIAGNOSIS — L97822 Non-pressure chronic ulcer of other part of left lower leg with fat layer exposed: Secondary | ICD-10-CM | POA: Diagnosis not present

## 2017-06-05 DIAGNOSIS — I110 Hypertensive heart disease with heart failure: Secondary | ICD-10-CM | POA: Diagnosis not present

## 2017-06-06 DIAGNOSIS — L97222 Non-pressure chronic ulcer of left calf with fat layer exposed: Secondary | ICD-10-CM | POA: Diagnosis not present

## 2017-06-06 DIAGNOSIS — I872 Venous insufficiency (chronic) (peripheral): Secondary | ICD-10-CM | POA: Diagnosis not present

## 2017-06-06 DIAGNOSIS — L97822 Non-pressure chronic ulcer of other part of left lower leg with fat layer exposed: Secondary | ICD-10-CM | POA: Diagnosis not present

## 2017-06-06 DIAGNOSIS — E876 Hypokalemia: Secondary | ICD-10-CM | POA: Diagnosis not present

## 2017-06-06 DIAGNOSIS — I110 Hypertensive heart disease with heart failure: Secondary | ICD-10-CM | POA: Diagnosis not present

## 2017-06-06 DIAGNOSIS — I5030 Unspecified diastolic (congestive) heart failure: Secondary | ICD-10-CM | POA: Diagnosis not present

## 2017-06-10 DIAGNOSIS — L97822 Non-pressure chronic ulcer of other part of left lower leg with fat layer exposed: Secondary | ICD-10-CM | POA: Diagnosis not present

## 2017-06-10 DIAGNOSIS — I110 Hypertensive heart disease with heart failure: Secondary | ICD-10-CM | POA: Diagnosis not present

## 2017-06-10 DIAGNOSIS — I872 Venous insufficiency (chronic) (peripheral): Secondary | ICD-10-CM | POA: Diagnosis not present

## 2017-06-10 DIAGNOSIS — L97222 Non-pressure chronic ulcer of left calf with fat layer exposed: Secondary | ICD-10-CM | POA: Diagnosis not present

## 2017-06-10 DIAGNOSIS — I5030 Unspecified diastolic (congestive) heart failure: Secondary | ICD-10-CM | POA: Diagnosis not present

## 2017-06-10 DIAGNOSIS — E876 Hypokalemia: Secondary | ICD-10-CM | POA: Diagnosis not present

## 2017-06-11 DIAGNOSIS — E876 Hypokalemia: Secondary | ICD-10-CM | POA: Diagnosis not present

## 2017-06-11 DIAGNOSIS — I5030 Unspecified diastolic (congestive) heart failure: Secondary | ICD-10-CM | POA: Diagnosis not present

## 2017-06-11 DIAGNOSIS — I110 Hypertensive heart disease with heart failure: Secondary | ICD-10-CM | POA: Diagnosis not present

## 2017-06-11 DIAGNOSIS — I872 Venous insufficiency (chronic) (peripheral): Secondary | ICD-10-CM | POA: Diagnosis not present

## 2017-06-11 DIAGNOSIS — L97822 Non-pressure chronic ulcer of other part of left lower leg with fat layer exposed: Secondary | ICD-10-CM | POA: Diagnosis not present

## 2017-06-11 DIAGNOSIS — L97222 Non-pressure chronic ulcer of left calf with fat layer exposed: Secondary | ICD-10-CM | POA: Diagnosis not present

## 2017-06-12 DIAGNOSIS — L97822 Non-pressure chronic ulcer of other part of left lower leg with fat layer exposed: Secondary | ICD-10-CM | POA: Diagnosis not present

## 2017-06-12 DIAGNOSIS — E876 Hypokalemia: Secondary | ICD-10-CM | POA: Diagnosis not present

## 2017-06-12 DIAGNOSIS — I110 Hypertensive heart disease with heart failure: Secondary | ICD-10-CM | POA: Diagnosis not present

## 2017-06-12 DIAGNOSIS — I872 Venous insufficiency (chronic) (peripheral): Secondary | ICD-10-CM | POA: Diagnosis not present

## 2017-06-12 DIAGNOSIS — L97222 Non-pressure chronic ulcer of left calf with fat layer exposed: Secondary | ICD-10-CM | POA: Diagnosis not present

## 2017-06-12 DIAGNOSIS — I5030 Unspecified diastolic (congestive) heart failure: Secondary | ICD-10-CM | POA: Diagnosis not present

## 2017-06-13 DIAGNOSIS — E876 Hypokalemia: Secondary | ICD-10-CM | POA: Diagnosis not present

## 2017-06-13 DIAGNOSIS — L97222 Non-pressure chronic ulcer of left calf with fat layer exposed: Secondary | ICD-10-CM | POA: Diagnosis not present

## 2017-06-13 DIAGNOSIS — I872 Venous insufficiency (chronic) (peripheral): Secondary | ICD-10-CM | POA: Diagnosis not present

## 2017-06-13 DIAGNOSIS — I5030 Unspecified diastolic (congestive) heart failure: Secondary | ICD-10-CM | POA: Diagnosis not present

## 2017-06-13 DIAGNOSIS — L97822 Non-pressure chronic ulcer of other part of left lower leg with fat layer exposed: Secondary | ICD-10-CM | POA: Diagnosis not present

## 2017-06-13 DIAGNOSIS — I110 Hypertensive heart disease with heart failure: Secondary | ICD-10-CM | POA: Diagnosis not present

## 2017-06-17 DIAGNOSIS — L97822 Non-pressure chronic ulcer of other part of left lower leg with fat layer exposed: Secondary | ICD-10-CM | POA: Diagnosis not present

## 2017-06-17 DIAGNOSIS — E876 Hypokalemia: Secondary | ICD-10-CM | POA: Diagnosis not present

## 2017-06-17 DIAGNOSIS — L97222 Non-pressure chronic ulcer of left calf with fat layer exposed: Secondary | ICD-10-CM | POA: Diagnosis not present

## 2017-06-17 DIAGNOSIS — I110 Hypertensive heart disease with heart failure: Secondary | ICD-10-CM | POA: Diagnosis not present

## 2017-06-17 DIAGNOSIS — I5030 Unspecified diastolic (congestive) heart failure: Secondary | ICD-10-CM | POA: Diagnosis not present

## 2017-06-17 DIAGNOSIS — I872 Venous insufficiency (chronic) (peripheral): Secondary | ICD-10-CM | POA: Diagnosis not present

## 2017-06-25 DIAGNOSIS — I872 Venous insufficiency (chronic) (peripheral): Secondary | ICD-10-CM | POA: Diagnosis not present

## 2017-06-25 DIAGNOSIS — L97222 Non-pressure chronic ulcer of left calf with fat layer exposed: Secondary | ICD-10-CM | POA: Diagnosis not present

## 2017-06-25 DIAGNOSIS — E876 Hypokalemia: Secondary | ICD-10-CM | POA: Diagnosis not present

## 2017-06-25 DIAGNOSIS — I5030 Unspecified diastolic (congestive) heart failure: Secondary | ICD-10-CM | POA: Diagnosis not present

## 2017-06-25 DIAGNOSIS — I110 Hypertensive heart disease with heart failure: Secondary | ICD-10-CM | POA: Diagnosis not present

## 2017-06-25 DIAGNOSIS — L97822 Non-pressure chronic ulcer of other part of left lower leg with fat layer exposed: Secondary | ICD-10-CM | POA: Diagnosis not present

## 2017-06-26 ENCOUNTER — Emergency Department (HOSPITAL_COMMUNITY): Payer: Medicare Other

## 2017-06-26 ENCOUNTER — Inpatient Hospital Stay (HOSPITAL_COMMUNITY): Payer: Medicare Other

## 2017-06-26 ENCOUNTER — Inpatient Hospital Stay (HOSPITAL_COMMUNITY)
Admission: EM | Admit: 2017-06-26 | Discharge: 2017-07-06 | DRG: 981 | Disposition: A | Discharge status: Home Health | Payer: Medicare Other | Attending: Internal Medicine | Admitting: Internal Medicine

## 2017-06-26 DIAGNOSIS — N170 Acute kidney failure with tubular necrosis: Secondary | ICD-10-CM | POA: Diagnosis present

## 2017-06-26 DIAGNOSIS — L97222 Non-pressure chronic ulcer of left calf with fat layer exposed: Secondary | ICD-10-CM | POA: Diagnosis not present

## 2017-06-26 DIAGNOSIS — R402432 Glasgow coma scale score 3-8, at arrival to emergency department: Secondary | ICD-10-CM

## 2017-06-26 DIAGNOSIS — F329 Major depressive disorder, single episode, unspecified: Secondary | ICD-10-CM | POA: Diagnosis not present

## 2017-06-26 DIAGNOSIS — I4901 Ventricular fibrillation: Secondary | ICD-10-CM | POA: Diagnosis not present

## 2017-06-26 DIAGNOSIS — J45901 Unspecified asthma with (acute) exacerbation: Secondary | ICD-10-CM | POA: Diagnosis present

## 2017-06-26 DIAGNOSIS — Z4800 Encounter for change or removal of nonsurgical wound dressing: Secondary | ICD-10-CM | POA: Diagnosis not present

## 2017-06-26 DIAGNOSIS — J810 Acute pulmonary edema: Secondary | ICD-10-CM | POA: Diagnosis not present

## 2017-06-26 DIAGNOSIS — G931 Anoxic brain damage, not elsewhere classified: Secondary | ICD-10-CM | POA: Diagnosis present

## 2017-06-26 DIAGNOSIS — R55 Syncope and collapse: Secondary | ICD-10-CM | POA: Diagnosis present

## 2017-06-26 DIAGNOSIS — I872 Venous insufficiency (chronic) (peripheral): Secondary | ICD-10-CM | POA: Diagnosis present

## 2017-06-26 DIAGNOSIS — Z6841 Body Mass Index (BMI) 40.0 and over, adult: Secondary | ICD-10-CM

## 2017-06-26 DIAGNOSIS — J9601 Acute respiratory failure with hypoxia: Secondary | ICD-10-CM

## 2017-06-26 DIAGNOSIS — I509 Heart failure, unspecified: Secondary | ICD-10-CM | POA: Diagnosis not present

## 2017-06-26 DIAGNOSIS — I130 Hypertensive heart and chronic kidney disease with heart failure and stage 1 through stage 4 chronic kidney disease, or unspecified chronic kidney disease: Secondary | ICD-10-CM | POA: Diagnosis present

## 2017-06-26 DIAGNOSIS — D869 Sarcoidosis, unspecified: Secondary | ICD-10-CM | POA: Diagnosis not present

## 2017-06-26 DIAGNOSIS — R918 Other nonspecific abnormal finding of lung field: Secondary | ICD-10-CM | POA: Diagnosis not present

## 2017-06-26 DIAGNOSIS — I480 Paroxysmal atrial fibrillation: Secondary | ICD-10-CM | POA: Diagnosis present

## 2017-06-26 DIAGNOSIS — J9600 Acute respiratory failure, unspecified whether with hypoxia or hypercapnia: Secondary | ICD-10-CM | POA: Diagnosis not present

## 2017-06-26 DIAGNOSIS — I5041 Acute combined systolic (congestive) and diastolic (congestive) heart failure: Secondary | ICD-10-CM | POA: Diagnosis not present

## 2017-06-26 DIAGNOSIS — R9431 Abnormal electrocardiogram [ECG] [EKG]: Secondary | ICD-10-CM

## 2017-06-26 DIAGNOSIS — Z823 Family history of stroke: Secondary | ICD-10-CM

## 2017-06-26 DIAGNOSIS — N183 Chronic kidney disease, stage 3 (moderate): Secondary | ICD-10-CM | POA: Diagnosis not present

## 2017-06-26 DIAGNOSIS — Z452 Encounter for adjustment and management of vascular access device: Secondary | ICD-10-CM | POA: Diagnosis not present

## 2017-06-26 DIAGNOSIS — I469 Cardiac arrest, cause unspecified: Secondary | ICD-10-CM

## 2017-06-26 DIAGNOSIS — J45909 Unspecified asthma, uncomplicated: Secondary | ICD-10-CM | POA: Diagnosis not present

## 2017-06-26 DIAGNOSIS — R0602 Shortness of breath: Secondary | ICD-10-CM | POA: Diagnosis not present

## 2017-06-26 DIAGNOSIS — I429 Cardiomyopathy, unspecified: Secondary | ICD-10-CM | POA: Diagnosis present

## 2017-06-26 DIAGNOSIS — N179 Acute kidney failure, unspecified: Secondary | ICD-10-CM | POA: Diagnosis not present

## 2017-06-26 DIAGNOSIS — N839 Noninflammatory disorder of ovary, fallopian tube and broad ligament, unspecified: Secondary | ICD-10-CM | POA: Diagnosis not present

## 2017-06-26 DIAGNOSIS — R739 Hyperglycemia, unspecified: Secondary | ICD-10-CM | POA: Diagnosis present

## 2017-06-26 DIAGNOSIS — I2510 Atherosclerotic heart disease of native coronary artery without angina pectoris: Secondary | ICD-10-CM | POA: Diagnosis present

## 2017-06-26 DIAGNOSIS — E872 Acidosis: Secondary | ICD-10-CM | POA: Diagnosis present

## 2017-06-26 DIAGNOSIS — Z95810 Presence of automatic (implantable) cardiac defibrillator: Secondary | ICD-10-CM | POA: Diagnosis not present

## 2017-06-26 DIAGNOSIS — E785 Hyperlipidemia, unspecified: Secondary | ICD-10-CM | POA: Diagnosis present

## 2017-06-26 DIAGNOSIS — J690 Pneumonitis due to inhalation of food and vomit: Secondary | ICD-10-CM | POA: Diagnosis present

## 2017-06-26 DIAGNOSIS — R579 Shock, unspecified: Secondary | ICD-10-CM | POA: Diagnosis not present

## 2017-06-26 DIAGNOSIS — I5021 Acute systolic (congestive) heart failure: Secondary | ICD-10-CM | POA: Diagnosis not present

## 2017-06-26 DIAGNOSIS — I080 Rheumatic disorders of both mitral and aortic valves: Secondary | ICD-10-CM | POA: Diagnosis present

## 2017-06-26 DIAGNOSIS — J4540 Moderate persistent asthma, uncomplicated: Secondary | ICD-10-CM | POA: Diagnosis not present

## 2017-06-26 DIAGNOSIS — Z7982 Long term (current) use of aspirin: Secondary | ICD-10-CM

## 2017-06-26 DIAGNOSIS — I10 Essential (primary) hypertension: Secondary | ICD-10-CM | POA: Diagnosis not present

## 2017-06-26 DIAGNOSIS — E876 Hypokalemia: Secondary | ICD-10-CM | POA: Diagnosis not present

## 2017-06-26 DIAGNOSIS — J9602 Acute respiratory failure with hypercapnia: Secondary | ICD-10-CM | POA: Diagnosis present

## 2017-06-26 DIAGNOSIS — Z9289 Personal history of other medical treatment: Secondary | ICD-10-CM

## 2017-06-26 DIAGNOSIS — J9811 Atelectasis: Secondary | ICD-10-CM | POA: Diagnosis not present

## 2017-06-26 DIAGNOSIS — I472 Ventricular tachycardia: Secondary | ICD-10-CM | POA: Diagnosis not present

## 2017-06-26 DIAGNOSIS — I5043 Acute on chronic combined systolic (congestive) and diastolic (congestive) heart failure: Secondary | ICD-10-CM | POA: Diagnosis present

## 2017-06-26 DIAGNOSIS — I5033 Acute on chronic diastolic (congestive) heart failure: Secondary | ICD-10-CM

## 2017-06-26 DIAGNOSIS — Z95818 Presence of other cardiac implants and grafts: Secondary | ICD-10-CM

## 2017-06-26 DIAGNOSIS — L97822 Non-pressure chronic ulcer of other part of left lower leg with fat layer exposed: Secondary | ICD-10-CM | POA: Diagnosis not present

## 2017-06-26 DIAGNOSIS — E878 Other disorders of electrolyte and fluid balance, not elsewhere classified: Secondary | ICD-10-CM | POA: Diagnosis present

## 2017-06-26 DIAGNOSIS — D8685 Sarcoid myocarditis: Principal | ICD-10-CM | POA: Diagnosis present

## 2017-06-26 DIAGNOSIS — R5381 Other malaise: Secondary | ICD-10-CM | POA: Diagnosis not present

## 2017-06-26 DIAGNOSIS — I493 Ventricular premature depolarization: Secondary | ICD-10-CM | POA: Diagnosis present

## 2017-06-26 DIAGNOSIS — I639 Cerebral infarction, unspecified: Secondary | ICD-10-CM | POA: Diagnosis not present

## 2017-06-26 DIAGNOSIS — G9341 Metabolic encephalopathy: Secondary | ICD-10-CM | POA: Diagnosis present

## 2017-06-26 DIAGNOSIS — I259 Chronic ischemic heart disease, unspecified: Secondary | ICD-10-CM | POA: Diagnosis present

## 2017-06-26 DIAGNOSIS — J811 Chronic pulmonary edema: Secondary | ICD-10-CM

## 2017-06-26 DIAGNOSIS — Z4682 Encounter for fitting and adjustment of non-vascular catheter: Secondary | ICD-10-CM | POA: Diagnosis not present

## 2017-06-26 DIAGNOSIS — D649 Anemia, unspecified: Secondary | ICD-10-CM | POA: Diagnosis present

## 2017-06-26 DIAGNOSIS — Z8249 Family history of ischemic heart disease and other diseases of the circulatory system: Secondary | ICD-10-CM

## 2017-06-26 DIAGNOSIS — Z833 Family history of diabetes mellitus: Secondary | ICD-10-CM

## 2017-06-26 DIAGNOSIS — Z79899 Other long term (current) drug therapy: Secondary | ICD-10-CM

## 2017-06-26 LAB — TSH: TSH: 9.201 u[IU]/mL — ABNORMAL HIGH (ref 0.350–4.500)

## 2017-06-26 LAB — URINALYSIS, ROUTINE W REFLEX MICROSCOPIC
Bilirubin Urine: NEGATIVE
Glucose, UA: 150 mg/dL — AB
Ketones, ur: NEGATIVE mg/dL
Leukocytes, UA: NEGATIVE
Nitrite: NEGATIVE
Protein, ur: >=300 mg/dL — AB
Specific Gravity, Urine: 1.018 (ref 1.005–1.030)
pH: 6 (ref 5.0–8.0)

## 2017-06-26 LAB — GLUCOSE, CAPILLARY
Glucose-Capillary: 153 mg/dL — ABNORMAL HIGH (ref 65–99)
Glucose-Capillary: 193 mg/dL — ABNORMAL HIGH (ref 65–99)
Glucose-Capillary: 194 mg/dL — ABNORMAL HIGH (ref 65–99)

## 2017-06-26 LAB — POCT I-STAT 3, ART BLOOD GAS (G3+)
Acid-base deficit: 9 mmol/L — ABNORMAL HIGH (ref 0.0–2.0)
Acid-base deficit: 9 mmol/L — ABNORMAL HIGH (ref 0.0–2.0)
Bicarbonate: 20 mmol/L (ref 20.0–28.0)
Bicarbonate: 17.5 mmol/L — ABNORMAL LOW (ref 20.0–28.0)
O2 Saturation: 98 %
O2 Saturation: 95 %
Patient temperature: 35
Patient temperature: 33
TCO2: 22 mmol/L (ref 0–100)
TCO2: 19 mmol/L (ref 0–100)
pCO2 arterial: 52.5 mmHg — ABNORMAL HIGH (ref 32.0–48.0)
pCO2 arterial: 34.1 mmHg (ref 32.0–48.0)
pH, Arterial: 7.176 — CL (ref 7.350–7.450)
pH, Arterial: 7.297 — ABNORMAL LOW (ref 7.350–7.450)
pO2, Arterial: 133 mmHg — ABNORMAL HIGH (ref 83.0–108.0)
pO2, Arterial: 68 mmHg — ABNORMAL LOW (ref 83.0–108.0)

## 2017-06-26 LAB — PROCALCITONIN: Procalcitonin: 0.19 ng/mL

## 2017-06-26 LAB — BLOOD GAS, ARTERIAL
Acid-base deficit: 5 mmol/L — ABNORMAL HIGH (ref 0.0–2.0)
Bicarbonate: 20.2 mmol/L (ref 20.0–28.0)
Drawn by: 270161
FIO2: 60
MECHVT: 540 mL
O2 Saturation: 99.5 %
PEEP: 5 cmH2O
Patient temperature: 35.7
RATE: 12 resp/min
pCO2 arterial: 39.3 mmHg (ref 32.0–48.0)
pH, Arterial: 7.32 — ABNORMAL LOW (ref 7.350–7.450)
pO2, Arterial: 270 mmHg — ABNORMAL HIGH (ref 83.0–108.0)

## 2017-06-26 LAB — CBC WITH DIFFERENTIAL/PLATELET
Basophils Absolute: 0 10*3/uL (ref 0.0–0.1)
Basophils Relative: 0 %
Eosinophils Absolute: 0.3 10*3/uL (ref 0.0–0.7)
Eosinophils Relative: 3 %
HCT: 35.4 % — ABNORMAL LOW (ref 36.0–46.0)
Hemoglobin: 11.2 g/dL — ABNORMAL LOW (ref 12.0–15.0)
Lymphocytes Relative: 54 %
Lymphs Abs: 4.6 10*3/uL — ABNORMAL HIGH (ref 0.7–4.0)
MCH: 29.5 pg (ref 26.0–34.0)
MCHC: 31.6 g/dL (ref 30.0–36.0)
MCV: 93.2 fL (ref 78.0–100.0)
Monocytes Absolute: 0.7 10*3/uL (ref 0.1–1.0)
Monocytes Relative: 8 %
Neutro Abs: 3 10*3/uL (ref 1.7–7.7)
Neutrophils Relative %: 35 %
Platelets: 259 10*3/uL (ref 150–400)
RBC: 3.8 MIL/uL — ABNORMAL LOW (ref 3.87–5.11)
RDW: 14.6 % (ref 11.5–15.5)
WBC: 8.5 10*3/uL (ref 4.0–10.5)

## 2017-06-26 LAB — RAPID URINE DRUG SCREEN, HOSP PERFORMED
Amphetamines: NOT DETECTED
Barbiturates: NOT DETECTED
Benzodiazepines: NOT DETECTED
Cocaine: NOT DETECTED
Opiates: NOT DETECTED
Tetrahydrocannabinol: NOT DETECTED

## 2017-06-26 LAB — COMPREHENSIVE METABOLIC PANEL
ALT: 108 U/L — ABNORMAL HIGH (ref 14–54)
AST: 168 U/L — ABNORMAL HIGH (ref 15–41)
Albumin: 3.2 g/dL — ABNORMAL LOW (ref 3.5–5.0)
Alkaline Phosphatase: 71 U/L (ref 38–126)
Anion gap: 12 (ref 5–15)
BUN: 22 mg/dL — ABNORMAL HIGH (ref 6–20)
CO2: 19 mmol/L — ABNORMAL LOW (ref 22–32)
Calcium: 8.4 mg/dL — ABNORMAL LOW (ref 8.9–10.3)
Chloride: 107 mmol/L (ref 101–111)
Creatinine, Ser: 1.39 mg/dL — ABNORMAL HIGH (ref 0.44–1.00)
GFR calc Af Amer: 48 mL/min — ABNORMAL LOW (ref >60)
GFR calc non Af Amer: 42 mL/min — ABNORMAL LOW (ref >60)
Glucose, Bld: 202 mg/dL — ABNORMAL HIGH (ref 65–99)
Potassium: 3.1 mmol/L — ABNORMAL LOW (ref 3.5–5.1)
Sodium: 138 mmol/L (ref 135–145)
Total Bilirubin: 0.7 mg/dL (ref 0.3–1.2)
Total Protein: 7.9 g/dL (ref 6.5–8.1)

## 2017-06-26 LAB — I-STAT CHEM 8, ED
BUN: 24 mg/dL — ABNORMAL HIGH (ref 6–20)
Calcium, Ion: 1.17 mmol/L (ref 1.15–1.40)
Chloride: 109 mmol/L (ref 101–111)
Creatinine, Ser: 1.3 mg/dL — ABNORMAL HIGH (ref 0.44–1.00)
Glucose, Bld: 193 mg/dL — ABNORMAL HIGH (ref 65–99)
HCT: 32 % — ABNORMAL LOW (ref 36.0–46.0)
Hemoglobin: 10.9 g/dL — ABNORMAL LOW (ref 12.0–15.0)
Potassium: 3.2 mmol/L — ABNORMAL LOW (ref 3.5–5.1)
Sodium: 143 mmol/L (ref 135–145)
TCO2: 19 mmol/L (ref 0–100)

## 2017-06-26 LAB — BASIC METABOLIC PANEL
Anion gap: 8 (ref 5–15)
BUN: 20 mg/dL (ref 6–20)
CO2: 19 mmol/L — ABNORMAL LOW (ref 22–32)
Calcium: 8 mg/dL — ABNORMAL LOW (ref 8.9–10.3)
Chloride: 111 mmol/L (ref 101–111)
Creatinine, Ser: 1.31 mg/dL — ABNORMAL HIGH (ref 0.44–1.00)
GFR calc Af Amer: 52 mL/min — ABNORMAL LOW (ref >60)
GFR calc non Af Amer: 45 mL/min — ABNORMAL LOW (ref >60)
Glucose, Bld: 195 mg/dL — ABNORMAL HIGH (ref 65–99)
Potassium: 3.8 mmol/L (ref 3.5–5.1)
Sodium: 138 mmol/L (ref 135–145)

## 2017-06-26 LAB — POCT I-STAT, CHEM 8
BUN: 22 mg/dL — ABNORMAL HIGH (ref 6–20)
Calcium, Ion: 1.08 mmol/L — ABNORMAL LOW (ref 1.15–1.40)
Chloride: 110 mmol/L (ref 101–111)
Creatinine, Ser: 1 mg/dL (ref 0.44–1.00)
Glucose, Bld: 209 mg/dL — ABNORMAL HIGH (ref 65–99)
HCT: 36 % (ref 36.0–46.0)
Hemoglobin: 12.2 g/dL (ref 12.0–15.0)
Potassium: 3.7 mmol/L (ref 3.5–5.1)
Sodium: 143 mmol/L (ref 135–145)
TCO2: 21 mmol/L (ref 0–100)

## 2017-06-26 LAB — MRSA PCR SCREENING: MRSA by PCR: NEGATIVE

## 2017-06-26 LAB — I-STAT TROPONIN, ED: Troponin i, poc: 0.01 ng/mL (ref 0.00–0.08)

## 2017-06-26 LAB — APTT: aPTT: 31 seconds (ref 24–36)

## 2017-06-26 LAB — HCG, SERUM, QUALITATIVE: Preg, Serum: NEGATIVE

## 2017-06-26 LAB — MAGNESIUM: Magnesium: 1.8 mg/dL (ref 1.7–2.4)

## 2017-06-26 LAB — LACTIC ACID, PLASMA: Lactic Acid, Venous: 3 mmol/L (ref 0.5–1.9)

## 2017-06-26 LAB — TROPONIN I: Troponin I: 0.09 ng/mL (ref <0.03)

## 2017-06-26 LAB — PROTIME-INR
INR: 1.11
Prothrombin Time: 14.4 seconds (ref 11.4–15.2)

## 2017-06-26 LAB — I-STAT CG4 LACTIC ACID, ED: Lactic Acid, Venous: 6.91 mmol/L (ref 0.5–1.9)

## 2017-06-26 MED ORDER — SODIUM CHLORIDE 0.9 % IV BOLUS (SEPSIS)
500.0000 mL | Freq: Once | INTRAVENOUS | Status: AC
  Administered 2017-06-26: 500 mL via INTRAVENOUS

## 2017-06-26 MED ORDER — AMIODARONE HCL IN DEXTROSE 360-4.14 MG/200ML-% IV SOLN
30.0000 mg/h | INTRAVENOUS | Status: DC
Start: 2017-06-27 — End: 2017-06-30
  Administered 2017-06-27 – 2017-06-29 (×7): 30 mg/h via INTRAVENOUS
  Filled 2017-06-26 (×6): qty 200

## 2017-06-26 MED ORDER — FENTANYL CITRATE (PF) 100 MCG/2ML IJ SOLN
INTRAMUSCULAR | Status: AC
  Administered 2017-06-26: 100 ug via INTRAVENOUS
  Filled 2017-06-26: qty 2

## 2017-06-26 MED ORDER — LORAZEPAM 2 MG/ML IJ SOLN
INTRAMUSCULAR | Status: AC
  Administered 2017-06-26: 2 mg via INTRAVENOUS
  Filled 2017-06-26: qty 1

## 2017-06-26 MED ORDER — POTASSIUM CHLORIDE 10 MEQ/100ML IV SOLN
10.0000 meq | INTRAVENOUS | Status: AC
  Administered 2017-06-26 (×3): 10 meq via INTRAVENOUS
  Filled 2017-06-26 (×3): qty 100

## 2017-06-26 MED ORDER — AMIODARONE HCL IN DEXTROSE 360-4.14 MG/200ML-% IV SOLN
60.0000 mg/h | INTRAVENOUS | Status: AC
  Administered 2017-06-26: 60 mg/h via INTRAVENOUS
  Filled 2017-06-26 (×2): qty 200

## 2017-06-26 MED ORDER — VANCOMYCIN HCL 10 G IV SOLR
1250.0000 mg | Freq: Two times a day (BID) | INTRAVENOUS | Status: DC
  Administered 2017-06-27 – 2017-06-29 (×5): 1250 mg via INTRAVENOUS
  Filled 2017-06-26 (×5): qty 1250

## 2017-06-26 MED ORDER — FENTANYL BOLUS VIA INFUSION
50.0000 ug | INTRAVENOUS | Status: DC | PRN
  Filled 2017-06-26: qty 50

## 2017-06-26 MED ORDER — SODIUM CHLORIDE 0.9 % IV SOLN
1.0000 ug/kg/min | INTRAVENOUS | Status: DC
  Administered 2017-06-26: 1 ug/kg/min via INTRAVENOUS
  Filled 2017-06-26: qty 20

## 2017-06-26 MED ORDER — SODIUM CHLORIDE 0.9 % IV SOLN
2.0000 mg/h | INTRAVENOUS | Status: DC
  Administered 2017-06-26 – 2017-06-27 (×2): 2 mg/h via INTRAVENOUS
  Administered 2017-06-28: 3 mg/h via INTRAVENOUS
  Filled 2017-06-26 (×4): qty 10

## 2017-06-26 MED ORDER — FAMOTIDINE IN NACL 20-0.9 MG/50ML-% IV SOLN
20.0000 mg | Freq: Two times a day (BID) | INTRAVENOUS | Status: DC
  Administered 2017-06-26 – 2017-07-01 (×11): 20 mg via INTRAVENOUS
  Filled 2017-06-26 (×11): qty 50

## 2017-06-26 MED ORDER — CHLORHEXIDINE GLUCONATE 0.12% ORAL RINSE (MEDLINE KIT)
15.0000 mL | Freq: Two times a day (BID) | OROMUCOSAL | Status: DC
  Administered 2017-06-26 – 2017-07-01 (×10): 15 mL via OROMUCOSAL

## 2017-06-26 MED ORDER — FENTANYL CITRATE (PF) 100 MCG/2ML IJ SOLN
100.0000 ug | Freq: Once | INTRAMUSCULAR | Status: AC
  Administered 2017-06-26: 100 ug via INTRAVENOUS

## 2017-06-26 MED ORDER — METHYLPREDNISOLONE SODIUM SUCC 125 MG IJ SOLR
60.0000 mg | Freq: Four times a day (QID) | INTRAMUSCULAR | Status: DC
  Administered 2017-06-26 – 2017-06-29 (×11): 60 mg via INTRAVENOUS
  Filled 2017-06-26 (×11): qty 2

## 2017-06-26 MED ORDER — CISATRACURIUM BOLUS VIA INFUSION
0.1000 mg/kg | Freq: Once | INTRAVENOUS | Status: AC
  Administered 2017-06-26: 15.7 mg via INTRAVENOUS
  Filled 2017-06-26: qty 16

## 2017-06-26 MED ORDER — ARFORMOTEROL TARTRATE 15 MCG/2ML IN NEBU
15.0000 ug | INHALATION_SOLUTION | Freq: Two times a day (BID) | RESPIRATORY_TRACT | Status: DC
  Administered 2017-06-26 – 2017-07-06 (×20): 15 ug via RESPIRATORY_TRACT
  Filled 2017-06-26 (×22): qty 2

## 2017-06-26 MED ORDER — SODIUM CHLORIDE 0.9 % IV SOLN
INTRAVENOUS | Status: DC
  Administered 2017-06-26 – 2017-07-01 (×3): via INTRAVENOUS

## 2017-06-26 MED ORDER — CISATRACURIUM BOLUS VIA INFUSION
0.0500 mg/kg | INTRAVENOUS | Status: DC | PRN
  Filled 2017-06-26: qty 8

## 2017-06-26 MED ORDER — ASPIRIN 300 MG RE SUPP
300.0000 mg | RECTAL | Status: AC
  Administered 2017-06-26: 300 mg via RECTAL
  Filled 2017-06-26: qty 1

## 2017-06-26 MED ORDER — FENTANYL CITRATE (PF) 100 MCG/2ML IJ SOLN
100.0000 ug | Freq: Once | INTRAMUSCULAR | Status: AC
  Administered 2017-06-26: 50 ug via INTRAVENOUS
  Filled 2017-06-26: qty 2

## 2017-06-26 MED ORDER — BUDESONIDE 0.5 MG/2ML IN SUSP
0.5000 mg | Freq: Two times a day (BID) | RESPIRATORY_TRACT | Status: DC
  Administered 2017-06-26 – 2017-07-06 (×20): 0.5 mg via RESPIRATORY_TRACT
  Filled 2017-06-26 (×22): qty 2

## 2017-06-26 MED ORDER — ORAL CARE MOUTH RINSE
15.0000 mL | OROMUCOSAL | Status: DC
  Administered 2017-06-26 – 2017-07-01 (×49): 15 mL via OROMUCOSAL

## 2017-06-26 MED ORDER — MIDAZOLAM HCL 2 MG/2ML IJ SOLN: 2.0000 mg | Freq: Once | INTRAMUSCULAR | Status: DC

## 2017-06-26 MED ORDER — NOREPINEPHRINE BITARTRATE 1 MG/ML IV SOLN
0.0000 ug/min | INTRAVENOUS | Status: DC
  Administered 2017-06-26: 5 ug/min via INTRAVENOUS
  Administered 2017-06-27: 2 ug/min via INTRAVENOUS
  Administered 2017-06-28: 3.013 ug/min via INTRAVENOUS
  Filled 2017-06-26 (×2): qty 4

## 2017-06-26 MED ORDER — HEPARIN SODIUM (PORCINE) 5000 UNIT/ML IJ SOLN
5000.0000 [IU] | Freq: Three times a day (TID) | INTRAMUSCULAR | Status: DC
  Administered 2017-06-26 – 2017-07-03 (×20): 5000 [IU] via SUBCUTANEOUS
  Filled 2017-06-26 (×19): qty 1

## 2017-06-26 MED ORDER — MIDAZOLAM BOLUS VIA INFUSION
2.0000 mg | INTRAVENOUS | Status: DC | PRN
  Filled 2017-06-26: qty 2

## 2017-06-26 MED ORDER — PROPOFOL 1000 MG/100ML IV EMUL
INTRAVENOUS | Status: AC
  Administered 2017-06-26: 5 ug/kg/min
  Filled 2017-06-26: qty 100

## 2017-06-26 MED ORDER — LORAZEPAM 2 MG/ML IJ SOLN
2.0000 mg | Freq: Once | INTRAMUSCULAR | Status: AC
  Administered 2017-06-26: 2 mg via INTRAVENOUS

## 2017-06-26 MED ORDER — ARTIFICIAL TEARS OPHTHALMIC OINT
1.0000 "application " | TOPICAL_OINTMENT | Freq: Three times a day (TID) | OPHTHALMIC | Status: DC
  Administered 2017-06-26 – 2017-06-28 (×5): 1 via OPHTHALMIC
  Filled 2017-06-26: qty 3.5

## 2017-06-26 MED ORDER — SODIUM CHLORIDE 0.9 % IV SOLN
2000.0000 mg | Freq: Once | INTRAVENOUS | Status: AC
  Administered 2017-06-26: 2000 mg via INTRAVENOUS
  Filled 2017-06-26: qty 2000

## 2017-06-26 MED ORDER — FENTANYL 2500MCG IN NS 250ML (10MCG/ML) PREMIX INFUSION
100.0000 ug/h | INTRAVENOUS | Status: DC
  Administered 2017-06-26 – 2017-06-27 (×2): 100 ug/h via INTRAVENOUS
  Administered 2017-06-28: 200 ug/h via INTRAVENOUS
  Administered 2017-06-28 – 2017-06-30 (×2): 150 ug/h via INTRAVENOUS
  Administered 2017-07-01: 50 ug/h via INTRAVENOUS
  Filled 2017-06-26 (×8): qty 250

## 2017-06-26 MED ORDER — IPRATROPIUM-ALBUTEROL 0.5-2.5 (3) MG/3ML IN SOLN
3.0000 mL | Freq: Four times a day (QID) | RESPIRATORY_TRACT | Status: DC
  Administered 2017-06-26 – 2017-07-01 (×19): 3 mL via RESPIRATORY_TRACT
  Filled 2017-06-26 (×20): qty 3

## 2017-06-26 MED ORDER — INSULIN ASPART 100 UNIT/ML ~~LOC~~ SOLN
2.0000 [IU] | SUBCUTANEOUS | Status: DC
  Administered 2017-06-26: 6 [IU] via SUBCUTANEOUS

## 2017-06-26 MED ORDER — AMIODARONE LOAD VIA INFUSION
150.0000 mg | Freq: Once | INTRAVENOUS | Status: AC
Start: 2017-06-26 — End: 2017-06-26
  Administered 2017-06-26: 150 mg via INTRAVENOUS
  Filled 2017-06-26: qty 83.34

## 2017-06-26 MED ORDER — PIPERACILLIN-TAZOBACTAM 3.375 G IVPB
3.3750 g | Freq: Three times a day (TID) | INTRAVENOUS | Status: AC
  Administered 2017-06-26 – 2017-07-01 (×16): 3.375 g via INTRAVENOUS
  Filled 2017-06-26 (×16): qty 50

## 2017-06-26 NOTE — Progress Notes (Signed)
RT NOTE:  Pt transferred from [facility] by [facility]. Pt with 7.5ETT secured @ 24 lip. ABG results reviewed by Dr. Emmit Alexanders. Vent settings will remain the same.    Vent settings @ [facility]:  06/26/17 1947  Adult Ventilator Settings  Vent Type Servo i  Humidity HME  Vent Mode PRVC  Vt Set 540 mL  Set Rate 12 bmp  FiO2 (%) 60 %  I Time 0.9 Sec(s)  PEEP 5 cmH20   ABG post intubation @ [facility] Results for Kari [name], Kari [name] (MRN [id number]) as of 06/26/2017 19:51  06/26/2017 18:20  Sample type ARTERIAL DRAW  Del. systems VENTILATOR  FIO2 60.00  Mode PRVC  VT 540  Peep/cpap 5.0  pH, Arterial 7.32 (L)  pCO2 arterial 39.3  pO2, Arterial 270 (H)  Acid-base deficit 5.0 (H)  Bicarbonate 20.2  O2 Saturation 99.5  Patient temp 35.7  Collection site LEFT RADIAL  Allens test  PASS

## 2017-06-26 NOTE — Progress Notes (Signed)
eLink Physician-Brief Progress Note Patient Name: Kari [name] DOB: 19-Apr-1961 MRN: [id number]   Date of Service  06/26/2017  HPI/Events of Note  Mild hyperglycemia.   eICU Interventions  Started phase 1 SSI.      Intervention Category Intermediate Interventions: Hyperglycemia - evaluation and treatment  Laverle Hobby 06/26/2017, 11:27 PM

## 2017-06-26 NOTE — ED Provider Notes (Signed)
[facility] DEPT Provider Note   CSN: [id number] Arrival date & time: 06/26/17  1451     History   Chief Complaint No chief complaint on file.   HPI Kari [name] is a 56 y.o. female.  Patient presents by EMS, with return of spontaneous circulation, following 5 minutes of CPR.  During CPR she received chest compressions, King airway, and was shocked for V. fib, 1.  She had return of pulses, following defibrillation.  History obtained after initial stabilization, from family members and friends.  The patient had been shopping, walked to a car, was sitting in the passenger seat talking to the driver, when she suddenly stopped talking.  The person with her felt like she had pulses when she checked, and that she was breathing although not completely normally.  EMS was called and arrived within 3-5 minutes.  Reportedly first responders initially stated that she had a pulse, then lost pulse so started CPR.  Family does not know of any other particular history, but might have led to sudden cardiac arrest.  Level 5 caveat-altered mental status   HPI  Past Medical History:  Diagnosis Date  . Asthma   . CHF (congestive heart failure) ([place]) 03/12/2014  . Depression   . Headache(784.0)   . Hyperlipidemia   . Hypertension   . Lung nodules   . Renal disorder   . Sarcoidosis   . Ulcer    left posterior calf    Patient Active Problem List   Diagnosis Date Noted  . Cardiac arrest ([place]) 06/26/2017  . Cellulitis 04/02/2017  . AKI (acute kidney injury) ([place]) 12/17/2016  . Cellulitis and abscess of right lower extremity 12/17/2016  . Syncope and collapse 12/17/2016  . Peripheral edema 12/17/2016  . Hypokalemia 12/17/2016  . Anemia 12/17/2016  . Acute diastolic CHF (congestive heart failure) ([place]) 03/14/2014  . CHF (congestive heart failure) ([place]) 03/12/2014  . Sarcoidosis ([place]) 03/12/2014  . Severe sepsis ([place]) 10/17/2013  . ARF (acute renal failure) ([place]) 10/08/2013  .  Cellulitis and abscess 10/08/2013  . Obesity 10/08/2013  . Volume depletion 10/08/2013  . Unspecified venous (peripheral) insufficiency 10/28/2012  . Mediastinal lymphadenopathy 10/16/2012  . Pulmonary nodules 10/16/2012  . 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 Chest pain 09/02/2012  . Asthma   . Hypertension   . Depression   . Venous stasis ulcers ([place]) 07/29/2012  . 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 Venous ulcer of leg ([place]) 07/08/2012    Past Surgical History:  Procedure Laterality Date  . cataract surgery Left 07-2013  . NO PAST SURGERIES      OB History    No data available       Home Medications    Prior to Admission medications   Medication Sig Start Date End Date Taking? Authorizing Provider  furosemide (LASIX) 40 MG tablet Take 40 mg by mouth daily. 02/20/17  Yes [provider]  potassium chloride SA (K-DUR,KLOR-CON) 20 MEQ tablet Take 60 mEq by mouth daily.   Yes [provider]  acetaminophen (TYLENOL) 325 MG tablet Take 2 tablets (650 mg total) by mouth every 6 (six) hours as needed for mild pain (or Fever >/= 101). 04/06/17   Sinda Du, MD  albuterol (PROVENTIL HFA;VENTOLIN HFA) 108 (90 BASE) MCG/ACT inhaler Inhale 2 puffs into the lungs every 6 (six) hours as needed for shortness of breath.    [provider]  Amino Acids-Protein Hydrolys (FEEDING  SUPPLEMENT, PRO-STAT SUGAR FREE 64,) LIQD Take 60 mLs by mouth 2 (two) times daily between meals. 04/06/17   Sinda Du, MD  aspirin EC 81 MG tablet Take 81 mg by mouth daily.    [provider]  collagenase (SANTYL) ointment Apply 1 application topically daily. Apply to wound BLE per Tx    [provider]  HYDROcodone-acetaminophen (NORCO/VICODIN) 5-325 MG tablet Take 1-2 tablets by mouth every 6 (six) hours as needed for moderate pain. 04/22/17   Reed, Tiffany L, DO  loratadine  (CLARITIN) 10 MG tablet Take 10 mg by mouth daily.    [provider]  Multiple Vitamin (MULTIVITAMIN WITH MINERALS) TABS tablet Take 1 tablet by mouth daily. 04/07/17   Sinda Du, MD  ondansetron (ZOFRAN) 4 MG tablet Take 1 tablet (4 mg total) by mouth every 6 (six) hours as needed for nausea. 04/06/17   Sinda Du, MD    Family History Family History  Problem Relation Age of Onset  . Heart failure Mother   . Diabetes Mellitus II Mother   . Hypertension Mother   . Cancer Mother        unknown type  . Heart disease Father   . Stroke Father   . Diabetes Mellitus II Father     Social History Social History  Substance Use Topics  . Smoking status: Never Smoker  . Smokeless tobacco: Never Used  . Alcohol use No     Allergies   Patient has no known allergies.   Review of Systems Review of Systems  Unable to perform ROS: Patient unresponsive     Physical Exam Updated Vital Signs BP 123/85   Pulse 78   Temp (!) 96.3 F (35.7 C)   Resp (!) 23   LMP 11/21/2011   SpO2 100%   Physical Exam  Constitutional: She appears well-developed.  Overweight, appears older than stated age.  HENT:  Head: Normocephalic and atraumatic.  Eyes: Conjunctivae and EOM are normal. Pupils are equal, round, and reactive to light.  Neck: Normal range of motion and phonation normal. Neck supple.  Cardiovascular:  Tachycardic  Pulmonary/Chest:  With [facility] airway in place, spontaneous respirations 12-15/min.  Lungs generally clear anteriorly.  No audible wheezing.  Abdominal: Soft. She exhibits distension (Moderate).  Musculoskeletal:  Bilateral lower leg Ace wraps present.  Neurological: She is unresponsive. GCS eye subscore is 1. GCS verbal subscore is 1. GCS motor subscore is 1.  Skin: Skin is warm and dry.  Psychiatric: She has a normal mood and affect. Her behavior is normal. Judgment and thought content normal.  Nursing note and vitals reviewed.    ED Treatments /  Results  Labs (all labs ordered are listed, but only abnormal results are displayed) Labs Reviewed  COMPREHENSIVE METABOLIC PANEL - Abnormal; Notable for the following:       Result Value   Potassium 3.1 (*)    CO2 19 (*)    Glucose, Bld 202 (*)    BUN 22 (*)    Creatinine, Ser 1.39 (*)    Calcium 8.4 (*)    Albumin 3.2 (*)    AST 168 (*)    ALT 108 (*)    GFR calc non Af Amer 42 (*)    GFR calc Af Amer 48 (*)    All other components within normal limits  CBC WITH DIFFERENTIAL/PLATELET - Abnormal; Notable for the following:    RBC 3.80 (*)    Hemoglobin 11.2 (*)    HCT 35.4 (*)  Lymphs Abs 4.6 (*)    All other components within normal limits  URINALYSIS, ROUTINE W REFLEX MICROSCOPIC - Abnormal; Notable for the following:    APPearance CLOUDY (*)    Glucose, UA 150 (*)    Hgb urine dipstick MODERATE (*)    Protein, ur >=300 (*)    Bacteria, UA RARE (*)    Squamous Epithelial / LPF 6-30 (*)    All other components within normal limits  BLOOD GAS, ARTERIAL - Abnormal; Notable for the following:    pH, Arterial 7.32 (*)    pO2, Arterial 270 (*)    Acid-base deficit 5.0 (*)    All other components within normal limits  I-STAT CHEM 8, ED - Abnormal; Notable for the following:    Potassium 3.2 (*)    BUN 24 (*)    Creatinine, Ser 1.30 (*)    Glucose, Bld 193 (*)    Hemoglobin 10.9 (*)    HCT 32.0 (*)    All other components within normal limits  I-STAT CG4 LACTIC ACID, ED - Abnormal; Notable for the following:    Lactic Acid, Venous 6.91 (*)    All other components within normal limits  RAPID URINE DRUG SCREEN, HOSP PERFORMED  I-STAT TROPOININ, ED    EKG  EKG Interpretation  Date/Time:  Wednesday June 26 2017 14:54:16 EDT Ventricular Rate:  117 PR Interval:    QRS Duration: 102 QT Interval:  320 QTC Calculation: 447 R Axis:   20 Text Interpretation:  Sinus tachycardia Ventricular premature complex Consider right atrial enlargement Repol abnrm suggests  ischemia, diffuse leads Baseline wander in lead(s) V2 V3 since last tracing no significant change Confirmed by Daleen Bo [phone]) on 06/26/2017 6:26:24 PM       Radiology Ct Head Wo Contrast  Result Date: 06/26/2017 CLINICAL DATA:  Cardiac arrest. EXAM: CT HEAD WITHOUT CONTRAST TECHNIQUE: Contiguous axial images were obtained from the base of the skull through the vertex without intravenous contrast. COMPARISON:  08/03/2006. FINDINGS: Brain: There is no evidence for acute hemorrhage, hydrocephalus, mass lesion, or abnormal extra-axial fluid collection. No definite CT evidence for acute infarction. Vascular: No hyperdense vessel or unexpected calcification. Skull: No evidence for fracture. No worrisome lytic or sclerotic lesion. Sinuses/Orbits: The visualized paranasal sinuses and mastoid air cells are clear. Visualized portions of the globes and intraorbital fat are unremarkable. Other: None. IMPRESSION: Stable.  No acute intracranial abnormality. Electronically Signed   By: Misty Stanley M.D.   On: 06/26/2017 16:36   Dg Chest Portable 1 View  Result Date: 06/26/2017 CLINICAL DATA:  Intubation. EXAM: PORTABLE CHEST 1 VIEW COMPARISON:  04/01/2017 FINDINGS: New endotracheal tube with tip at the lower margin of the clavicular heads. An orogastric tube reaches the stomach. Cardiomegaly that is chronic and similar to prior when allowing for differences in technique. Low lung volumes with perihilar atelectasis and vascular congestion. No edema, effusion, or visible pneumothorax. IMPRESSION: 1. New endotracheal and orogastric tubes in good position. 2. Low volume chest with perihilar atelectasis and vascular congestion. 3. Cardiomegaly. Electronically Signed   By: Monte Fantasia M.D.   On: 06/26/2017 15:25    Procedures Procedure Name: Intubation Date/Time: 06/26/2017 2:50 PM Performed by: Daleen Bo Preoxygenation: King airway present. Laryngoscope Size: Glidescope and 4 Tube size: 7.5 mm Number  of attempts: 1 Airway Equipment and Method: Stylet and Video-laryngoscopy Placement Confirmation: ETT inserted through vocal cords under direct vision,  Breath sounds checked- equal and bilateral and Positive ETCO2 Secured at: 24 cm  Tube secured with: ETT holder Dental Injury: Teeth and Oropharynx as per pre-operative assessment        (including critical care time)  Medications Ordered in ED Medications  potassium chloride 10 mEq in 100 mL IVPB (10 mEq Intravenous New Bag/Given 06/26/17 1800)  propofol (DIPRIVAN) 1000 MG/100ML infusion (  Stopped 06/26/17 1544)  fentaNYL (SUBLIMAZE) injection 100 mcg (100 mcg Intravenous Given 06/26/17 1502)  LORazepam (ATIVAN) injection 2 mg (2 mg Intravenous Given 06/26/17 1502)  sodium chloride 0.9 % bolus 500 mL (500 mLs Intravenous New Bag/Given 06/26/17 1534)     Initial Impression / Assessment and Plan / ED Course  I have reviewed the triage vital signs and the nursing notes.  Pertinent labs & imaging results that were available during my care of the patient were reviewed by me and considered in my medical decision making (see chart for details).  Clinical Course as of Jun 27 1839  Wed Jun 26, 2017  1509 Postintubation sedation noted initially with fentanyl and Ativan, followed by propofol drip.  [EW]  1521 IV bolus ordered for blood pressure 90/60, while on propofol drip.  [EW]  Stites ordered for cooling.  I was informed by nursing, that this device, and any cooling device is not used at this facility.  Therefore the patient cannot be cooled.  [EW]    Clinical Course User Index [EW] Daleen Bo, MD     Patient Vitals for the past 24 hrs:  BP Temp Pulse Resp SpO2  06/26/17 1830 123/85 (!) 96.3 F (35.7 C) 78 (!) 23 100 %  06/26/17 1800 119/85 (!) 96.3 F (35.7 C) 79 16 100 %  06/26/17 1731 122/89 (!) 96.3 F (35.7 C) 78 12 100 %  06/26/17 1730 (!) 131/105 (!) 96.3 F (35.7 C) 89 (!) 29 100 %  06/26/17 1720 (!) 131/111 (!)  96.3 F (35.7 C) 84 (!) 28 100 %  06/26/17 1710 (!) 128/108 (!) 96.3 F (35.7 C) 86 (!) 30 100 %  06/26/17 1700 (!) 117/91 (!) 96.3 F (35.7 C) 80 17 100 %  06/26/17 1650 100/71 (!) 96.4 F (35.8 C) 73 12 100 %  06/26/17 1640 101/71 (!) 96.4 F (35.8 C) 73 14 100 %  06/26/17 1630 112/76 (!) 96.6 F (35.9 C) 75 18 100 %  06/26/17 1620 (!) 92/58 (!) 96.8 F (36 C) 80 13 100 %  06/26/17 1610 (!) 85/52 (!) 97 F (36.1 C) 80 13 100 %  06/26/17 1600 (!) 79/48 (!) 97.2 F (36.2 C) 79 18 100 %  06/26/17 1550 (!) 79/45 (!) 97.5 F (36.4 C) 80 (!) 21 100 %  06/26/17 1530 (!) 72/54 (!) 97.5 F (36.4 C) 87 12 97 %  06/26/17 1520 - - - - 100 %    4:59 PM Reevaluation with update and discussion. After initial assessment and treatment, an updated evaluation reveals patient has stabilized somewhat, now with blood pressure greater than 220 systolic, off sedation, and continuing to be unresponsive.  Head CT negative for acute abnormality.  Patient's family's were updated on findings. Didier Brandenburg L    4:59 PM-Consult complete with hospitalist. Patient case explained and discussed.  She feels that the patient cannot be managed at this facility.  Call ended at 84: 3  17: 32- Case has been discussed with multiple family members.  They agreed to transfer to [facility].  Requested called back from intensivist, there.  18:05- Case discussed with Dr. Corinna Lines, accepts patient in transfer.  He requested ABG and to cool the patient.   CRITICAL CARE Performed by: Richarda Blade Total critical care time: 75 minutes Critical care time was exclusive of separately billable procedures and treating other patients. Critical care was necessary to treat or prevent imminent or life-threatening deterioration. Critical care was time spent personally by me on the following activities: development of treatment plan with patient and/or surrogate as well as nursing, discussions with consultants, evaluation of  patient's response to treatment, examination of patient, obtaining history from patient or surrogate, ordering and performing treatments and interventions, ordering and review of laboratory studies, ordering and review of radiographic studies, pulse oximetry and re-evaluation of patient's condition.  Final Clinical Impressions(s) / ED Diagnoses   Final diagnoses:  Cardiac arrest [facility])  Ventricular fibrillation ([place])  Glasgow coma scale total score 3-8, at arrival to emergency department [facility])  Hypokalemia    Evaluation is consistent with V. fib arrest, with return of spontaneous circulation.  No particular causative factors were uncovered.  Patient has a history of diastolic heart failure, mild grade 1.  Evaluation revealed incidental hypokalemia, mild at 3.1.  Transient hypotension associated with intubation, and use of propofol sedation.  It appears her last cardiology evaluation was in 2014, at which time she was worked up with noninvasive testing, with reassuring findings.  No additional cardiac intervention or evaluation was done or recommended.  Troponin negative today, initially.  Blood pressure stabilized with intravenous fluids.,  Coma persisted in the emergency department, off sedation.  Arterial blood gas is reassuring.  patient requires ICU admission, and will require transfer to a higher level of care.  Nursing Notes Reviewed/ Care Coordinated Applicable Imaging Reviewed Interpretation of Laboratory Data incorporated into ED treatment   Plan: Admit  New Prescriptions New Prescriptions   No medications on file     Daleen Bo, MD 06/26/17 2109

## 2017-06-26 NOTE — Progress Notes (Signed)
RT NOTE:  RT attempted to insert Arterial Catheter x4 without success. Blood flow noted with each attempt, however, RT unable to successfully thread catheter due to resistance and blood flow disruption. RN @ bedside throughout procedure and aware to contact MD. ABG drawn for 2200 test.

## 2017-06-26 NOTE — ED Notes (Signed)
Per EMS- pt asked stranger for a ride from [place] store. Pt went unresponsive in passenger seat. CPR started by bystanders and FD. Pt received approximately 5 minutes of CPR with 1 mg EPI through lle IO and 1 shock for v-fib, pulses returned and pt brought to ED with [facility] airway and bag assisted ventilation.

## 2017-06-26 NOTE — ED Notes (Signed)
Notified pt's emergency contact-Larry and Raissa Dam that pt is being transferred to [facility] and given room number.  They are driving back from [place].  State they wish to make sure it is known pt is a full code.

## 2017-06-26 NOTE — ED Notes (Signed)
Report given to Surveyor, quantity at [facility].

## 2017-06-26 NOTE — H&P (Addendum)
PULMONARY / CRITICAL CARE MEDICINE   Name: Kari [name] MRN: [id number] DOB: [birth year]    ADMISSION DATE:  06/26/2017 CONSULTATION DATE:  06/26/17  REFERRING MD:  Dr. Eulis Foster  CHIEF COMPLAINT:  Cardiac Arrest  HISTORY OF PRESENT ILLNESS:  Patient is intubated and unresponsive, therefore HPI obtained from medical chart review.  56 year old female with past medical history significant for asthma, heart failure (11/2016 EF 55-60%) , HTN, Questionable sarcoidosis (based on mediastinal lymphadenopathy no biopsy), HLD, obesity, chronic BLE ulcers, large cystic ovarian mass, headache and depression who presented to [facility] ED on 7/4 in cardiac arrest around 1455.    ED staff state that they have contacted her family.  Questionably patient lives with her brother who is traveling back from [place].  Patient also has a sister who was present at [facility] and unaware of any recent complaints from patient.   Reportedly patient had asked a stranger for a ride from the [place] store and went unresponsive in the car.  Bystander and FD CPR was started.  EMS arrived and given one epinephrine and one defibrillation for v-fib.  Estimated 5 -10 mins of CPR were given prior to ROSC.  King airway was placed and patient transferred to the ED where patient remained unresponsive not requiring any sedation.  Hypotensive treated with 3.5 L NS bolus.  Labs noted for glucose 112, Na 138, K 3.1, CO2 19, sCr 1.39 (previously 1.17 on 5/18), BUN 22, AST 160, ALT 108, albumin 3.2, troponin I poc 0.01, Lactic acid 6.91, WBC 8.5, Hgb 11.2, UDS neg, ABG 7.32/39.3/270 on 60% FiO2.  CXR shows pulmonary vascular congestion. EKG non acute.  Definitive airway placed in ED.  Head CT negative. Patient to be transferred to [facility] for targeted temperature management, PCCM to admit.   PAST MEDICAL HISTORY :  She  has a past medical history of Asthma; CHF (congestive heart failure) ([place]) (03/12/2014); Depression; Headache(784.0);  Hyperlipidemia; Hypertension; Lung nodules; Renal disorder; Sarcoidosis; and Ulcer.  PAST SURGICAL HISTORY: She  has a past surgical history that includes No past surgeries and cataract surgery (Left, 07-2013).  No Known Allergies  No current facility-administered medications on file prior to encounter.    Current Outpatient Prescriptions on File Prior to Encounter  Medication Sig  . furosemide (LASIX) 40 MG tablet Take 40 mg by mouth daily.  . potassium chloride SA (K-DUR,KLOR-CON) 20 MEQ tablet Take 60 mEq by mouth daily.  Marland Kitchen acetaminophen (TYLENOL) 325 MG tablet Take 2 tablets (650 mg total) by mouth every 6 (six) hours as needed for mild pain (or Fever >/= 101).  Marland Kitchen albuterol (PROVENTIL HFA;VENTOLIN HFA) 108 (90 BASE) MCG/ACT inhaler Inhale 2 puffs into the lungs every 6 (six) hours as needed for shortness of breath.  . Amino Acids-Protein Hydrolys (FEEDING SUPPLEMENT, PRO-STAT SUGAR FREE 64,) LIQD Take 60 mLs by mouth 2 (two) times daily between meals.  Marland Kitchen aspirin EC 81 MG tablet Take 81 mg by mouth daily.  . collagenase (SANTYL) ointment Apply 1 application topically daily. Apply to wound BLE per Tx  . HYDROcodone-acetaminophen (NORCO/VICODIN) 5-325 MG tablet Take 1-2 tablets by mouth every 6 (six) hours as needed for moderate pain.  Marland Kitchen loratadine (CLARITIN) 10 MG tablet Take 10 mg by mouth daily.  . Multiple Vitamin (MULTIVITAMIN WITH MINERALS) TABS tablet Take 1 tablet by mouth daily.  . ondansetron (ZOFRAN) 4 MG tablet Take 1 tablet (4 mg total) by mouth every 6 (six) hours as needed for nausea.  FAMILY HISTORY:  Her indicated that the status of her mother is unknown. She indicated that the status of her father is unknown.    SOCIAL HISTORY: She  reports that she has never smoked. She has never used smokeless tobacco. She reports that she does not drink alcohol or use drugs.  REVIEW OF SYSTEMS:  Unable to assess.  SUBJECTIVE:  Fentanyl 50 mcg given in transport for biting on  ETT, otherwise patient has been unresponsive.  Temp 35.7 on arrival with ice packs  VITAL SIGNS: BP 118/72   Pulse 62   Temp (!) 96.3 F (35.7 C)   Resp 20   Ht 5\' 5"  (1.651 m)   Wt (!) 345 lb 3.9 oz (156.6 kg)   LMP 11/21/2011   SpO2 100%   BMI 57.45 kg/m   HEMODYNAMICS:    VENTILATOR SETTINGS: Vent Mode: PRVC FiO2 (%):  [60 %] 60 % Set Rate:  [12 bmp] 12 bmp Vt Set:  [540 mL] 540 mL PEEP:  [5 cmH20] 5 cmH20 Plateau Pressure:  [28 cmH20-30 cmH20] 30 cmH20  INTAKE / OUTPUT: I/O last 3 completed shifts: In: 100 [IV Piggyback:100] Out: -   PHYSICAL EXAMINATION: General:  Obese middle aged female lying in bed, intubated, sedated, critically ill HEENT: [place]/AT, MM pink/moist, no dentition, OGT, ETT 7.5 cm/ 24 at lip, pupils 4mm/minimally reactive, no gag, + corneals Neuro: Unresponsive, withdrawals to pain in all extremities CV: RRR, no m/r/g  PULM: Coarse breath sounds and Wheezing b/l; IS breathing over the vent GI: soft obese abd with cooling pads in place, no bs Extremities: cool/dry, +1 pulses, nonpitting BLE edema, , Left tibial IO locked Skin: no rashes, BLE wrapped in ace bandages for chronic venous statis    LABS:  BMET  Recent Labs Lab 06/26/17 1533 06/26/17 1603  NA 138 143  K 3.1* 3.2*  CL 107 109  CO2 19*  --   BUN 22* 24*  CREATININE 1.39* 1.30*  GLUCOSE 202* 193*    Electrolytes  Recent Labs Lab 06/26/17 1533  CALCIUM 8.4*    CBC  Recent Labs Lab 06/26/17 1533 06/26/17 1603  WBC 8.5  --   HGB 11.2* 10.9*  HCT 35.4* 32.0*  PLT 259  --     Coag's No results for input(s): APTT, INR in the last 168 hours.  Sepsis Markers  Recent Labs Lab 06/26/17 1541  LATICACIDVEN 6.91*    ABG  Recent Labs Lab 06/26/17 1820  PHART 7.32*  PCO2ART 39.3  PO2ART 270*    Liver Enzymes  Recent Labs Lab 06/26/17 1533  AST 168*  ALT 108*  ALKPHOS 71  BILITOT 0.7  ALBUMIN 3.2*    Cardiac Enzymes No results for input(s):  TROPONINI, PROBNP in the last 168 hours.  Glucose No results for input(s): GLUCAP in the last 168 hours.  Imaging Ct Head Wo Contrast  Result Date: 06/26/2017 CLINICAL DATA:  Cardiac arrest. EXAM: CT HEAD WITHOUT CONTRAST TECHNIQUE: Contiguous axial images were obtained from the base of the skull through the vertex without intravenous contrast. COMPARISON:  08/03/2006. FINDINGS: Brain: There is no evidence for acute hemorrhage, hydrocephalus, mass lesion, or abnormal extra-axial fluid collection. No definite CT evidence for acute infarction. Vascular: No hyperdense vessel or unexpected calcification. Skull: No evidence for fracture. No worrisome lytic or sclerotic lesion. Sinuses/Orbits: The visualized paranasal sinuses and mastoid air cells are clear. Visualized portions of the globes and intraorbital fat are unremarkable. Other: None. IMPRESSION: Stable.  No acute intracranial abnormality.  Electronically Signed   By: Misty Stanley M.D.   On: 06/26/2017 16:36   Dg Chest Portable 1 View  Result Date: 06/26/2017 CLINICAL DATA:  Intubation. EXAM: PORTABLE CHEST 1 VIEW COMPARISON:  04/01/2017 FINDINGS: New endotracheal tube with tip at the lower margin of the clavicular heads. An orogastric tube reaches the stomach. Cardiomegaly that is chronic and similar to prior when allowing for differences in technique. Low lung volumes with perihilar atelectasis and vascular congestion. No edema, effusion, or visible pneumothorax. IMPRESSION: 1. New endotracheal and orogastric tubes in good position. 2. Low volume chest with perihilar atelectasis and vascular congestion. 3. Cardiomegaly. Electronically Signed   By: Monte Fantasia M.D.   On: 06/26/2017 15:25   STUDIES:  11/2016 TTE >> LVEF 55-60%, G1DD,  Mild AR, mild MR, mildly dilated LA, PAP 47 mm Hg EKG 7/4 >> ST 117, diffuse STD CXR 7/4 >> ETT at clavicular heads, OGT in stomach, chronic cardiomegaly, low volume chest with perihilar atelectasis and vascular  congestion  TTE >> EEG>>  CULTURES: 7/4 BCx2 >> 7/4 [place] >>  ANTIBIOTICS: none  SIGNIFICANT EVENTS: 7/4 Admit Vfib arrest  LINES/TUBES: PIV  x2  Left tibial IO 7/4 ETT >> 7/4 OGT >> 7/4 foley >>  DISCUSSION: 63HUT with diastolic heart failure, obesity, HTN, Asthma, Sarcoidosis, with witnessed VF arrest.  5-10 mins of CPR, 1 epi, and 1 defib given prior to ROSC.  Patient unresponsive following and therefore TTM started. Unknown of any patient complaints prior to arrest.   ASSESSMENT / PLAN:  PULMONARY 1. Acute hypoxic respiratory failure; Acute Hypercapneic respiratory failure; Asthma exacerbation; Aspiration pneumonia - reportedly was asymptomatic prior to the arrest, so do not feel this arrest was caused by an asthma exacerbation. However currently she is wheezing. Will start Solumedrol, Duonebs, Pulmicort, Brovana.  - CXR on my review shows patchy pulmonary infiltrates (edema vs aspiratiron?) - ABG on arrival shows acute hypercapnea; increased RR from 12 to 18 on the vent; repeat ABG in 1 hour.  - Wean FIO2 as tolerated - GI and DVT prophylaxis - NPO - Send [place] - Empirically start Vanc and Zosyn - Check procal; trend lactate  CARDIOVASCULAR 1. Vfib Cardiac Arrest; Hx diastolic HF (65/46 EF 50-35%), HTN, HLD, sarcoidosis  -estimated  5-10 min of CPR prior to ROSC; not at neurologic baseline so started therapeutic hypothermia (with ice bags at outside hospital, T:35.7 on arrival). Now has arctic sun pads in place to continue cooling.  - Cardiology consulted - unclear if this arrest was due to coronary ischemia vs a primary arrhythmia; unclear the history of her sarcoidosis and if she could've had any cardiac involvement which would predispose to arrhythmia.  - at some point will need cardiac MRI - Trend troponin and EKG - TTE - Trend lactate - Goal MAP > 65 - To place CVC and Aline  RENAL 1. 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 - foley catheter in place; monitor UOP closely.  - not convinced she is intravascularly depleted; will obtain CVP after placing CVC  2. Lactate acidosis: likely related to tissue ischemia from the arrest; will continue to trend lactate  3. Hypokalemia: K 3.2  - repleted with 30MEQ KCL - BMET q 2 per TTM protocol  GASTROINTESTINAL No acute issues    HEMATOLOGIC 1. Anemia: - Hgb 10.9, appears chronic with Hgb baseline 8-9's range - unclear etiology; continue to monitor   INFECTIOUS No acute processes  ENDOCRINE  No acute issues   NEUROLOGIC 1. Anoxic encephalopathy: - head CT neg, UDS neg - given her poor neuro exam with ? 5-10 mins downtime is concerning w/guarded prognosis - after she is rewarmed, may want to consider repeat head imaging.  - RASS goal: -4 - TTM at 33 degrees - Fentanyl and versed for sedation - Nimbex gtt - TOF and BIS monitoring  - EEG   GU 1. VERY Large cystic ovarian mass: - diagnosed in 03/2017, surgery postponed due to acute issues with AKI and Hypokalemia at that time.  - unclear if malignant vs benign.    FAMILY  - Updates: Updated in CCU conference room - Inter-disciplinary family meet or Palliative Care meeting due by:  07/03/17   60 minutes nonprocedural Critical Care time  Vernie Murders, MD Pulmonary and [place] Pager: [phone]  06/26/2017, 8:11 PM

## 2017-06-26 NOTE — Progress Notes (Signed)
Patient beginning to have frequent PVC's. Checked lytes: K3.8, Mg1.8. Will start Amiodarone gtt. Awaiting Cards eval.

## 2017-06-26 NOTE — Progress Notes (Signed)
Pharmacy Antibiotic Note  Kari [name] is a 56 y.o. female admitted on 06/26/2017 with cardiac arrest.  Pharmacy has been consulted for vancomycin and Zosyn for suspected pneumonia. Scr 1.3 (1.2 on 04/26/17). WBC WNL, currently being cooled to 33 degrees celcius.  Plan: Vancomycin 2000mg  IV once then 1250mg   IV every 12 hours.  Goal trough 15-20 mcg/mL. Zosyn 3.375g IV q8h (4 hour infusion).  Height: 5\' 5"  (165.1 cm) Weight: (!) 345 lb 3.9 oz (156.6 kg) IBW/kg (Calculated) : 57  Temp (24hrs), Avg:96.3 F (35.7 C), Min:93.9 F (34.4 C), Max:97.5 F (36.4 C)   Recent Labs Lab 06/26/17 1533 06/26/17 1541 06/26/17 1603  WBC 8.5  --   --   CREATININE 1.39*  --  1.30*  LATICACIDVEN  --  6.91*  --     Estimated Creatinine Clearance: 74.7 mL/min (A) (by C-G formula based on SCr of 1.3 mg/dL (H)).    No Known Allergies  Antimicrobials this admission: 7/4 vancomcyin>> 7/4 zosyn>>  Thank you for allowing pharmacy to be a part of this patient's care.  Jodean Lima Marceil Welp 06/26/2017 9:46 PM

## 2017-06-26 NOTE — Procedures (Signed)
Central Venous Catheter Insertion Procedure Note Kari [name] [id number] [birth year]  Procedure: Insertion of Central Venous Catheter Indications: Assessment of intravascular volume, Drug and/or fluid administration and Frequent blood sampling  Procedure Details Consent: Risks of procedure as well as the alternatives and risks of each were explained to the (patient/caregiver).  Consent for procedure obtained. Time Out: Verified patient identification, verified procedure, site/side was marked, verified correct patient position, special equipment/implants available, medications/allergies/relevent history reviewed, required imaging and test results available.  Performed  Maximum sterile technique was used including antiseptics, cap, gloves, gown, hand hygiene, mask and sheet. Skin prep: Chlorhexidine; local anesthetic administered A antimicrobial bonded/coated triple lumen catheter was placed in the left internal jugular vein using the Seldinger technique to 18 cm.  Line sutured and Biopatch applied.  Sterile dressing applied.    Evaluation Blood flow good Complications: No apparent complications Patient did tolerate procedure well. Chest X-ray ordered to verify placement.  CXR: pending.  Procedure performed under ultrasound guidance for real time vessel cannulation.     Kennieth Rad, AGACNP-BC [facility] Pgr: [phone] or if no answer [phone] 06/26/2017, 10:10 PM   Attending Addendum: I was present during key portions of procedure and available to assist as needed.  Vernie Murders, MD Pulmonary & Critical care

## 2017-06-26 NOTE — Consult Note (Signed)
CARDIOLOGY INPATIENT CONSULTATION NOTE  Patient ID: Kari [name] MRN: [id number], DOB/AGE: [birth year]   Admit date: 06/26/2017   Primary Physician: Rosita Fire, MD Primary Cardiologist: Kirk Ruths MD  Reason for Consult:   VF cardiac arrest  Requesting Physician: Jennelle Human NP  HPI: This is a 56 y.o.AA female with no prior history of CAD but h/o chest pain, -ve nuclear stress, sarcoidosis, obesity, HTN, HLD, HFpEF, depression and ?asthma presented with VF cardiac arrest.  Patient lives in [place] and asked for a ride by a stranger when she suddenly passed out. She received 5-10 min of CPR and received king's airway. She was shocked for v fib and given 1 mg of epi. Patient however remained unresponsive. She received 50 mcg of fentanyl. Currently on hypothermia protocol. History is limited since pt is intubated and not responding. Currenlty she is off pressors, doing well hemodynamically. No repeat VF/VT.   Of note, she saw Dr. Stanford Breed in 2014 for chest pain and had a stress test at that time which did not demonstrate ischemia. Patient also had a syncope/seizure like episode on last christmas at which time her brother thumped her back and she recovered. Could be possible VT/VF episode.   In addition, she was found to have a large ovarian cyst 42 lbs in size in 03/2017 and was planned to have surgery by her ob/gyn physician who wanted her to have rehab first since she was very weak. She also has b/l severe leg edema with ulcers. She has a daughter, sisters and brother. Her uncle and sister died of MI prematurely.   CARDIOGRAPHICS #ECG 06/26/17 showed normal sinus rhythm with ST depressions across precordial leads and inferior leads  #CXR 06/26/17 - alveolar pulmonary edema and hilar adenopathy  #adenosine Cardiolite nuclear stress 2 d protocol  negative for ischemia, post stress LVEF 54%  Problem List: Past Medical History:  Diagnosis Date  . Asthma   . CHF  (congestive heart failure) ([place]) 03/12/2014  . Depression   . Headache(784.0)   . Hyperlipidemia   . Hypertension   . Lung nodules   . Renal disorder   . Sarcoidosis   . Ulcer    left posterior calf    Past Surgical History:  Procedure Laterality Date  . cataract surgery Left 07-2013  . NO PAST SURGERIES       Allergies: No Known Allergies   Home Medications Current Facility-Administered Medications  Medication Dose Route Frequency Provider Last Rate Last Dose  . 0.9 %  sodium chloride infusion   Intravenous Continuous Jennelle Human B, NP      . arformoterol (BROVANA) nebulizer solution 15 mcg  15 mcg Nebulization BID Hammonds, Sharyn Blitz, MD      . artificial tears (LACRILUBE) ophthalmic ointment 1 application  1 application Both Eyes D6L Jennelle Human B, NP      . aspirin suppository 300 mg  300 mg Rectal NOW Jennelle Human B, NP      . budesonide (PULMICORT) nebulizer solution 0.5 mg  0.5 mg Nebulization BID Hammonds, Sharyn Blitz, MD      . cisatracurium (NIMBEX) 200 mg in sodium chloride 0.9 % 200 mL (1 mg/mL) infusion  1-1.5 mcg/kg/min Intravenous Continuous Jennelle Human B, NP 9.4 mL/hr at 06/26/17 2031 1 mcg/kg/min at 06/26/17 2031   And  . cisatracurium (NIMBEX) bolus via infusion 7.8 mg  0.05 mg/kg Intravenous PRN Jennelle Human B, NP      . famotidine (PEPCID) IVPB 20 mg premix  20  mg Intravenous Q12H Jennelle Human B, NP      . fentaNYL (SUBLIMAZE) bolus via infusion 50 mcg  50 mcg Intravenous Q30 min PRN Jennelle Human B, NP      . fentaNYL 253mcg in NS 239mL (79mcg/ml) infusion-PREMIX  100-300 mcg/hr Intravenous Continuous Jennelle Human B, NP 10 mL/hr at 06/26/17 2015 100 mcg/hr at 06/26/17 2015  . heparin injection 5,000 Units  5,000 Units Subcutaneous Q8H Jennelle Human B, NP      . ipratropium-albuterol (DUONEB) 0.5-2.5 (3) MG/3ML nebulizer solution 3 mL  3 mL Nebulization Q6H Hammonds, Sharyn Blitz, MD      . methylPREDNISolone sodium succinate (SOLU-MEDROL) 125  mg/2 mL injection 60 mg  60 mg Intravenous Q6H Hammonds, Sharyn Blitz, MD      . midazolam (VERSED) 50 mg in sodium chloride 0.9 % 50 mL (1 mg/mL) infusion  2-10 mg/hr Intravenous Continuous Jennelle Human B, NP 2 mL/hr at 06/26/17 2020 2 mg/hr at 06/26/17 2020  . midazolam (VERSED) bolus via infusion 2 mg  2 mg Intravenous Q30 min PRN Jennelle Human B, NP      . midazolam (VERSED) injection 2 mg  2 mg Intravenous Once Jennelle Human B, NP      . norepinephrine (LEVOPHED) 4 mg in dextrose 5 % 250 mL (0.016 mg/mL) infusion  0-50 mcg/min Intravenous Titrated Arnell Asal, NP         Family History  Problem Relation Age of Onset  . Heart failure Mother   . Diabetes Mellitus II Mother   . Hypertension Mother   . Cancer Mother        unknown type  . Heart disease Father   . Stroke Father   . Diabetes Mellitus II Father      Social History   Social History  . Marital status: Single    Spouse name: N/A  . Number of children: 1  . Years of education: N/A   Occupational History  . Not on file.   Social History Main Topics  . Smoking status: Never Smoker  . Smokeless tobacco: Never Used  . Alcohol use No  . Drug use: No  . Sexual activity: Not Currently   Other Topics Concern  . Not on file   Social History Narrative   Lives alone.     Review of Systems: General: fatigue increase weight negative for chills, fever, night sweats  Cardiovascular: leg edema, dyspnea but no chest pain  Dermatological: negative for rash Respiratory: wheezing Urologic: negative for hematuria Abdominal: abdominal swelling Neurologic: negative for visual changes, syncope, or dizziness Hematology: unable to obtain Psychiatry: non suicidal/homicidal  Musculoskeletal: leg swelling, ulcers   Physical Exam: Vitals: BP 118/72   Pulse 95   Temp (!) 95.9 F (35.5 C) (Core (Comment))   Resp (!) 21   Ht 5\' 5"  (1.651 m)   Wt (!) 156.6 kg (345 lb 3.9 oz)   LMP 11/21/2011   SpO2 100%   BMI  57.45 kg/m  General: intubated Neck: JVP unable to assess, neck supple Heart: regular rate and rhythm, S1, S2, no murmurs  Lungs: bilateral expiratory wheezing GI: distended Extremities: 3+ edema bilaterally, ulcers on the anterior aspect of the lower 1/3rd of the b/l legs Neuro: AAO x 0, dilated pupils, sluggish reaction to light, gag present, GCS 3 Psych: intubated, in coma non responsive   Labs:   Results for orders placed or performed during the hospital encounter of 06/26/17 (from the past 24 hour(s))  Urinalysis, Routine  w reflex microscopic     Status: Abnormal   Collection Time: 06/26/17  3:20 PM  Result Value Ref Range   Color, Urine YELLOW YELLOW   APPearance CLOUDY (A) CLEAR   Specific Gravity, Urine 1.018 1.005 - 1.030   pH 6.0 5.0 - 8.0   Glucose, UA 150 (A) NEGATIVE mg/dL   Hgb urine dipstick MODERATE (A) NEGATIVE   Bilirubin Urine NEGATIVE NEGATIVE   Ketones, ur NEGATIVE NEGATIVE mg/dL   Protein, ur >=300 (A) NEGATIVE mg/dL   Nitrite NEGATIVE NEGATIVE   Leukocytes, UA NEGATIVE NEGATIVE   RBC / HPF TOO NUMEROUS TO COUNT 0 - 5 RBC/hpf   WBC, UA 6-30 0 - 5 WBC/hpf   Bacteria, UA RARE (A) NONE SEEN   Squamous Epithelial / LPF 6-30 (A) NONE SEEN  Comprehensive metabolic panel     Status: Abnormal   Collection Time: 06/26/17  3:33 PM  Result Value Ref Range   Sodium 138 135 - 145 mmol/L   Potassium 3.1 (L) 3.5 - 5.1 mmol/L   Chloride 107 101 - 111 mmol/L   CO2 19 (L) 22 - 32 mmol/L   Glucose, Bld 202 (H) 65 - 99 mg/dL   BUN 22 (H) 6 - 20 mg/dL   Creatinine, Ser 1.39 (H) 0.44 - 1.00 mg/dL   Calcium 8.4 (L) 8.9 - 10.3 mg/dL   Total Protein 7.9 6.5 - 8.1 g/dL   Albumin 3.2 (L) 3.5 - 5.0 g/dL   AST 168 (H) 15 - 41 U/L   ALT 108 (H) 14 - 54 U/L   Alkaline Phosphatase 71 38 - 126 U/L   Total Bilirubin 0.7 0.3 - 1.2 mg/dL   GFR calc non Af Amer 42 (L) >60 mL/min   GFR calc Af Amer 48 (L) >60 mL/min   Anion gap 12 5 - 15  CBC with Differential     Status:  Abnormal   Collection Time: 06/26/17  3:33 PM  Result Value Ref Range   WBC 8.5 4.0 - 10.5 K/uL   RBC 3.80 (L) 3.87 - 5.11 MIL/uL   Hemoglobin 11.2 (L) 12.0 - 15.0 g/dL   HCT 35.4 (L) 36.0 - 46.0 %   MCV 93.2 78.0 - 100.0 fL   MCH 29.5 26.0 - 34.0 pg   MCHC 31.6 30.0 - 36.0 g/dL   RDW 14.6 11.5 - 15.5 %   Platelets 259 150 - 400 K/uL   Neutrophils Relative % 35 %   Neutro Abs 3.0 1.7 - 7.7 K/uL   Lymphocytes Relative 54 %   Lymphs Abs 4.6 (H) 0.7 - 4.0 K/uL   Monocytes Relative 8 %   Monocytes Absolute 0.7 0.1 - 1.0 K/uL   Eosinophils Relative 3 %   Eosinophils Absolute 0.3 0.0 - 0.7 K/uL   Basophils Relative 0 %   Basophils Absolute 0.0 0.0 - 0.1 K/uL  I-stat troponin, ED     Status: None   Collection Time: 06/26/17  3:39 PM  Result Value Ref Range   Troponin i, poc 0.01 0.00 - 0.08 ng/mL   Comment 3          I-Stat CG4 Lactic Acid, ED     Status: Abnormal   Collection Time: 06/26/17  3:41 PM  Result Value Ref Range   Lactic Acid, Venous 6.91 (HH) 0.5 - 1.9 mmol/L  I-stat Chem 8, ED     Status: Abnormal   Collection Time: 06/26/17  4:03 PM  Result Value Ref Range   Sodium  143 135 - 145 mmol/L   Potassium 3.2 (L) 3.5 - 5.1 mmol/L   Chloride 109 101 - 111 mmol/L   BUN 24 (H) 6 - 20 mg/dL   Creatinine, Ser 1.30 (H) 0.44 - 1.00 mg/dL   Glucose, Bld 193 (H) 65 - 99 mg/dL   Calcium, Ion 1.17 1.15 - 1.40 mmol/L   TCO2 19 0 - 100 mmol/L   Hemoglobin 10.9 (L) 12.0 - 15.0 g/dL   HCT 32.0 (L) 36.0 - 46.0 %  Urine rapid drug screen (hosp performed)     Status: None   Collection Time: 06/26/17  5:06 PM  Result Value Ref Range   Opiates NONE DETECTED NONE DETECTED   Cocaine NONE DETECTED NONE DETECTED   Benzodiazepines NONE DETECTED NONE DETECTED   Amphetamines NONE DETECTED NONE DETECTED   Tetrahydrocannabinol NONE DETECTED NONE DETECTED   Barbiturates NONE DETECTED NONE DETECTED  Blood gas, arterial     Status: Abnormal   Collection Time: 06/26/17  6:20 PM  Result Value  Ref Range   FIO2 60.00    Delivery systems VENTILATOR    Mode PRESSURE REGULATED VOLUME CONTROL    VT 540 mL   LHR 12 resp/min   Peep/cpap 5.0 cm H20   pH, Arterial 7.32 (L) 7.350 - 7.450   pCO2 arterial 39.3 32.0 - 48.0 mmHg   pO2, Arterial 270 (H) 83.0 - 108.0 mmHg   Bicarbonate 20.2 20.0 - 28.0 mmol/L   Acid-base deficit 5.0 (H) 0.0 - 2.0 mmol/L   O2 Saturation 99.5 %   Patient temperature 35.7    Collection site LEFT RADIAL    Drawn by [id number]    Sample type ARTERIAL DRAW    Allens test (pass/fail) PASS PASS     Radiology/Studies: Ct Head Wo Contrast  Result Date: 06/26/2017 CLINICAL DATA:  Cardiac arrest. EXAM: CT HEAD WITHOUT CONTRAST TECHNIQUE: Contiguous axial images were obtained from the base of the skull through the vertex without intravenous contrast. COMPARISON:  08/03/2006. FINDINGS: Brain: There is no evidence for acute hemorrhage, hydrocephalus, mass lesion, or abnormal extra-axial fluid collection. No definite CT evidence for acute infarction. Vascular: No hyperdense vessel or unexpected calcification. Skull: No evidence for fracture. No worrisome lytic or sclerotic lesion. Sinuses/Orbits: The visualized paranasal sinuses and mastoid air cells are clear. Visualized portions of the globes and intraorbital fat are unremarkable. Other: None. IMPRESSION: Stable.  No acute intracranial abnormality. Electronically Signed   By: Misty Stanley M.D.   On: 06/26/2017 16:36   Dg Chest Portable 1 View  Result Date: 06/26/2017 CLINICAL DATA:  Intubation. EXAM: PORTABLE CHEST 1 VIEW COMPARISON:  04/01/2017 FINDINGS: New endotracheal tube with tip at the lower margin of the clavicular heads. An orogastric tube reaches the stomach. Cardiomegaly that is chronic and similar to prior when allowing for differences in technique. Low lung volumes with perihilar atelectasis and vascular congestion. No edema, effusion, or visible pneumothorax. IMPRESSION: 1. New endotracheal and orogastric tubes in  good position. 2. Low volume chest with perihilar atelectasis and vascular congestion. 3. Cardiomegaly. Electronically Signed   By: Monte Fantasia M.D.   On: 06/26/2017 15:25    Medical decision making:  Discussed care with the patient's sisters and brothers Discussed care with the PCCM physician  Reviewed labs and imaging personally Reviewed prior records  ASSESSMENT AND PLAN:  This is a 56 y.o.AA female without known CAD, prior history of sarcoidosis, HTN presented with VF arrest and poor post arrest neurological response. Currently hemodynamically stable with  ischemic ECG changes.    Active Problems:   Cardiac arrest ([place])  VF cardiac arrest, etiology could be ischemic vs. Sarcoidosis related. ECG is abnormal and ischemic, prior possible VT/VF episode on 12/17/2016 relieved by cardiac thump by the brother - monitor for mental recovery, address goals of care and if she requires further testing cardiac catheterization could be considered, keep her NPO post midnight. Will discuss for need for cardiac cath with the family/daughter when she arrives. Eventually EP consult if patient neurologically recovers  Acute pulmonary edema, acute on chronic diastolic heart failure - unspecified NYHA class - gentle diuresis, avoid fluid hydration, echocardiogram in the AM     Signed, Flossie Dibble, MD MS 06/26/2017, 8:39 PM

## 2017-06-26 NOTE — ED Notes (Signed)
Pt requiring no sedation at this time.  Family in and out at bedside.

## 2017-06-26 NOTE — ED Notes (Signed)
MD aware of pt's BP.  Fluids infusing.  Pt had 1 L by ems and 1 L here.

## 2017-06-26 NOTE — ED Notes (Signed)
Mica with [facility] given report.

## 2017-06-27 ENCOUNTER — Inpatient Hospital Stay (HOSPITAL_COMMUNITY): Payer: Medicare Other

## 2017-06-27 DIAGNOSIS — I5033 Acute on chronic diastolic (congestive) heart failure: Secondary | ICD-10-CM

## 2017-06-27 DIAGNOSIS — I469 Cardiac arrest, cause unspecified: Secondary | ICD-10-CM

## 2017-06-27 DIAGNOSIS — J9601 Acute respiratory failure with hypoxia: Secondary | ICD-10-CM

## 2017-06-27 DIAGNOSIS — J810 Acute pulmonary edema: Secondary | ICD-10-CM

## 2017-06-27 DIAGNOSIS — J811 Chronic pulmonary edema: Secondary | ICD-10-CM

## 2017-06-27 LAB — BASIC METABOLIC PANEL
Anion gap: 7 (ref 5–15)
Anion gap: 9 (ref 5–15)
Anion gap: 9 (ref 5–15)
Anion gap: 11 (ref 5–15)
Anion gap: 8 (ref 5–15)
BUN: 18 mg/dL (ref 6–20)
BUN: 19 mg/dL (ref 6–20)
BUN: 19 mg/dL (ref 6–20)
BUN: 19 mg/dL (ref 6–20)
BUN: 19 mg/dL (ref 6–20)
CO2: 16 mmol/L — ABNORMAL LOW (ref 22–32)
CO2: 17 mmol/L — ABNORMAL LOW (ref 22–32)
CO2: 17 mmol/L — ABNORMAL LOW (ref 22–32)
CO2: 15 mmol/L — ABNORMAL LOW (ref 22–32)
CO2: 16 mmol/L — ABNORMAL LOW (ref 22–32)
Calcium: 7.8 mg/dL — ABNORMAL LOW (ref 8.9–10.3)
Calcium: 7.8 mg/dL — ABNORMAL LOW (ref 8.9–10.3)
Calcium: 7.9 mg/dL — ABNORMAL LOW (ref 8.9–10.3)
Calcium: 7.9 mg/dL — ABNORMAL LOW (ref 8.9–10.3)
Calcium: 8.1 mg/dL — ABNORMAL LOW (ref 8.9–10.3)
Chloride: 112 mmol/L — ABNORMAL HIGH (ref 101–111)
Chloride: 112 mmol/L — ABNORMAL HIGH (ref 101–111)
Chloride: 112 mmol/L — ABNORMAL HIGH (ref 101–111)
Chloride: 108 mmol/L (ref 101–111)
Chloride: 110 mmol/L (ref 101–111)
Creatinine, Ser: 1.18 mg/dL — ABNORMAL HIGH (ref 0.44–1.00)
Creatinine, Ser: 1.21 mg/dL — ABNORMAL HIGH (ref 0.44–1.00)
Creatinine, Ser: 1.1 mg/dL — ABNORMAL HIGH (ref 0.44–1.00)
Creatinine, Ser: 1.13 mg/dL — ABNORMAL HIGH (ref 0.44–1.00)
Creatinine, Ser: 1.09 mg/dL — ABNORMAL HIGH (ref 0.44–1.00)
GFR calc Af Amer: 57 mL/min — ABNORMAL LOW (ref >60)
GFR calc Af Amer: 59 mL/min — ABNORMAL LOW (ref >60)
GFR calc Af Amer: >60 mL/min (ref >60)
GFR calc Af Amer: >60 mL/min (ref >60)
GFR calc Af Amer: >60 mL/min (ref >60)
GFR calc non Af Amer: 49 mL/min — ABNORMAL LOW (ref >60)
GFR calc non Af Amer: 51 mL/min — ABNORMAL LOW (ref >60)
GFR calc non Af Amer: 55 mL/min — ABNORMAL LOW (ref >60)
GFR calc non Af Amer: 54 mL/min — ABNORMAL LOW (ref >60)
GFR calc non Af Amer: 56 mL/min — ABNORMAL LOW (ref >60)
Glucose, Bld: 201 mg/dL — ABNORMAL HIGH (ref 65–99)
Glucose, Bld: 206 mg/dL — ABNORMAL HIGH (ref 65–99)
Glucose, Bld: 152 mg/dL — ABNORMAL HIGH (ref 65–99)
Glucose, Bld: 161 mg/dL — ABNORMAL HIGH (ref 65–99)
Glucose, Bld: 176 mg/dL — ABNORMAL HIGH (ref 65–99)
Potassium: 3.7 mmol/L (ref 3.5–5.1)
Potassium: 3.8 mmol/L (ref 3.5–5.1)
Potassium: 3.2 mmol/L — ABNORMAL LOW (ref 3.5–5.1)
Potassium: 3.2 mmol/L — ABNORMAL LOW (ref 3.5–5.1)
Potassium: 3.1 mmol/L — ABNORMAL LOW (ref 3.5–5.1)
Sodium: 136 mmol/L (ref 135–145)
Sodium: 137 mmol/L (ref 135–145)
Sodium: 138 mmol/L (ref 135–145)
Sodium: 134 mmol/L — ABNORMAL LOW (ref 135–145)
Sodium: 134 mmol/L — ABNORMAL LOW (ref 135–145)

## 2017-06-27 LAB — GLUCOSE, CAPILLARY
Glucose-Capillary: 135 mg/dL — ABNORMAL HIGH (ref 65–99)
Glucose-Capillary: 138 mg/dL — ABNORMAL HIGH (ref 65–99)
Glucose-Capillary: 142 mg/dL — ABNORMAL HIGH (ref 65–99)
Glucose-Capillary: 146 mg/dL — ABNORMAL HIGH (ref 65–99)
Glucose-Capillary: 146 mg/dL — ABNORMAL HIGH (ref 65–99)
Glucose-Capillary: 146 mg/dL — ABNORMAL HIGH (ref 65–99)
Glucose-Capillary: 147 mg/dL — ABNORMAL HIGH (ref 65–99)
Glucose-Capillary: 148 mg/dL — ABNORMAL HIGH (ref 65–99)
Glucose-Capillary: 149 mg/dL — ABNORMAL HIGH (ref 65–99)
Glucose-Capillary: 149 mg/dL — ABNORMAL HIGH (ref 65–99)
Glucose-Capillary: 152 mg/dL — ABNORMAL HIGH (ref 65–99)
Glucose-Capillary: 153 mg/dL — ABNORMAL HIGH (ref 65–99)
Glucose-Capillary: 153 mg/dL — ABNORMAL HIGH (ref 65–99)
Glucose-Capillary: 159 mg/dL — ABNORMAL HIGH (ref 65–99)
Glucose-Capillary: 172 mg/dL — ABNORMAL HIGH (ref 65–99)
Glucose-Capillary: 190 mg/dL — ABNORMAL HIGH (ref 65–99)
Glucose-Capillary: 204 mg/dL — ABNORMAL HIGH (ref 65–99)
Glucose-Capillary: 207 mg/dL — ABNORMAL HIGH (ref 65–99)
Glucose-Capillary: 219 mg/dL — ABNORMAL HIGH (ref 65–99)
Glucose-Capillary: 224 mg/dL — ABNORMAL HIGH (ref 65–99)
Glucose-Capillary: 229 mg/dL — ABNORMAL HIGH (ref 65–99)
Glucose-Capillary: 268 mg/dL — ABNORMAL HIGH (ref 65–99)

## 2017-06-27 LAB — POCT I-STAT 3, ART BLOOD GAS (G3+)
Acid-base deficit: 10 mmol/L — ABNORMAL HIGH (ref 0.0–2.0)
Acid-base deficit: 10 mmol/L — ABNORMAL HIGH (ref 0.0–2.0)
Bicarbonate: 17.6 mmol/L — ABNORMAL LOW (ref 20.0–28.0)
Bicarbonate: 17.5 mmol/L — ABNORMAL LOW (ref 20.0–28.0)
O2 Saturation: 100 %
O2 Saturation: 99 %
Patient temperature: 33.2
Patient temperature: 32.9
TCO2: 19 mmol/L (ref 0–100)
TCO2: 19 mmol/L (ref 0–100)
pCO2 arterial: 38.3 mmHg (ref 32.0–48.0)
pCO2 arterial: 36.5 mmHg (ref 32.0–48.0)
pH, Arterial: 7.249 — ABNORMAL LOW (ref 7.350–7.450)
pH, Arterial: 7.265 — ABNORMAL LOW (ref 7.350–7.450)
pO2, Arterial: 211 mmHg — ABNORMAL HIGH (ref 83.0–108.0)
pO2, Arterial: 145 mmHg — ABNORMAL HIGH (ref 83.0–108.0)

## 2017-06-27 LAB — PROCALCITONIN: Procalcitonin: 0.32 ng/mL

## 2017-06-27 LAB — CBC
HCT: 35.4 % — ABNORMAL LOW (ref 36.0–46.0)
Hemoglobin: 10.9 g/dL — ABNORMAL LOW (ref 12.0–15.0)
MCH: 28.4 pg (ref 26.0–34.0)
MCHC: 30.8 g/dL (ref 30.0–36.0)
MCV: 92.2 fL (ref 78.0–100.0)
Platelets: 180 10*3/uL (ref 150–400)
RBC: 3.84 MIL/uL — ABNORMAL LOW (ref 3.87–5.11)
RDW: 14.5 % (ref 11.5–15.5)
WBC: 7.2 10*3/uL (ref 4.0–10.5)

## 2017-06-27 LAB — POCT I-STAT, CHEM 8
BUN: 20 mg/dL (ref 6–20)
BUN: 21 mg/dL — ABNORMAL HIGH (ref 6–20)
Calcium, Ion: 1.12 mmol/L — ABNORMAL LOW (ref 1.15–1.40)
Calcium, Ion: 1.15 mmol/L (ref 1.15–1.40)
Chloride: 110 mmol/L (ref 101–111)
Chloride: 111 mmol/L (ref 101–111)
Creatinine, Ser: 0.9 mg/dL (ref 0.44–1.00)
Creatinine, Ser: 0.9 mg/dL (ref 0.44–1.00)
Glucose, Bld: 178 mg/dL — ABNORMAL HIGH (ref 65–99)
Glucose, Bld: 232 mg/dL — ABNORMAL HIGH (ref 65–99)
HCT: 33 % — ABNORMAL LOW (ref 36.0–46.0)
HCT: 36 % (ref 36.0–46.0)
Hemoglobin: 11.2 g/dL — ABNORMAL LOW (ref 12.0–15.0)
Hemoglobin: 12.2 g/dL (ref 12.0–15.0)
Potassium: 3.3 mmol/L — ABNORMAL LOW (ref 3.5–5.1)
Potassium: 3.8 mmol/L (ref 3.5–5.1)
Sodium: 143 mmol/L (ref 135–145)
Sodium: 143 mmol/L (ref 135–145)
TCO2: 18 mmol/L (ref 0–100)
TCO2: 20 mmol/L (ref 0–100)

## 2017-06-27 LAB — PHOSPHORUS: Phosphorus: 3.3 mg/dL (ref 2.5–4.6)

## 2017-06-27 LAB — PROTIME-INR
INR: 1.15
Prothrombin Time: 14.8 seconds (ref 11.4–15.2)

## 2017-06-27 LAB — ECHOCARDIOGRAM COMPLETE
Height: 65 in
Weight: 5088.92 oz

## 2017-06-27 LAB — TROPONIN I
Troponin I: 0.05 ng/mL (ref <0.03)
Troponin I: 0.04 ng/mL (ref <0.03)

## 2017-06-27 LAB — MAGNESIUM: Magnesium: 1.8 mg/dL (ref 1.7–2.4)

## 2017-06-27 LAB — APTT: aPTT: 30 seconds (ref 24–36)

## 2017-06-27 MED ORDER — COLLAGENASE 250 UNIT/GM EX OINT
TOPICAL_OINTMENT | Freq: Every day | CUTANEOUS | Status: DC
  Administered 2017-06-27 – 2017-06-29 (×3): via TOPICAL
  Administered 2017-06-30: 1 via TOPICAL
  Administered 2017-06-30 – 2017-07-05 (×6): via TOPICAL
  Administered 2017-07-06: 1 via TOPICAL
  Filled 2017-06-27 (×5): qty 30

## 2017-06-27 MED ORDER — PERFLUTREN LIPID MICROSPHERE
2.0000 mL | INTRAVENOUS | Status: AC | PRN
  Administered 2017-06-27: 2 mL via INTRAVENOUS
  Filled 2017-06-27: qty 10

## 2017-06-27 MED ORDER — POTASSIUM CHLORIDE 10 MEQ/100ML IV SOLN
10.0000 meq | INTRAVENOUS | Status: AC
  Administered 2017-06-27 (×4): 10 meq via INTRAVENOUS
  Filled 2017-06-27: qty 100

## 2017-06-27 MED ORDER — POTASSIUM CHLORIDE 10 MEQ/50ML IV SOLN
10.0000 meq | INTRAVENOUS | Status: AC
  Administered 2017-06-27 (×2): 10 meq via INTRAVENOUS
  Filled 2017-06-27: qty 50

## 2017-06-27 MED ORDER — SODIUM CHLORIDE 0.9% FLUSH
10.0000 mL | Freq: Two times a day (BID) | INTRAVENOUS | Status: DC
  Administered 2017-06-27 – 2017-07-02 (×8): 10 mL
  Administered 2017-07-03: 20 mL

## 2017-06-27 MED ORDER — ASPIRIN 300 MG RE SUPP
300.0000 mg | Freq: Every day | RECTAL | Status: DC
  Administered 2017-06-27 – 2017-07-01 (×5): 300 mg via RECTAL
  Filled 2017-06-27 (×6): qty 1

## 2017-06-27 MED ORDER — INSULIN ASPART 100 UNIT/ML ~~LOC~~ SOLN
2.0000 [IU] | SUBCUTANEOUS | Status: DC
  Administered 2017-06-27: 4 [IU] via SUBCUTANEOUS
  Administered 2017-06-28 – 2017-06-30 (×11): 2 [IU] via SUBCUTANEOUS
  Administered 2017-06-30: 4 [IU] via SUBCUTANEOUS
  Administered 2017-06-30 (×3): 2 [IU] via SUBCUTANEOUS

## 2017-06-27 MED ORDER — STERILE WATER FOR INJECTION IV SOLN
INTRAVENOUS | Status: DC
  Administered 2017-06-27 – 2017-06-28 (×2): via INTRAVENOUS
  Filled 2017-06-27 (×3): qty 850

## 2017-06-27 MED ORDER — CISATRACURIUM BOLUS VIA INFUSION
0.0500 mg/kg | INTRAVENOUS | Status: DC | PRN
  Filled 2017-06-27: qty 8

## 2017-06-27 MED ORDER — SODIUM CHLORIDE 0.9% FLUSH: 10.0000 mL | INTRAVENOUS | Status: DC | PRN

## 2017-06-27 MED ORDER — CISATRACURIUM BESYLATE (PF) 200 MG/20ML IV SOLN
1.0000 ug/kg/min | INTRAVENOUS | Status: DC
  Administered 2017-06-27 (×2): 1 ug/kg/min via INTRAVENOUS
  Filled 2017-06-27 (×2): qty 20

## 2017-06-27 MED ORDER — INSULIN REGULAR HUMAN 100 UNIT/ML IJ SOLN
INTRAMUSCULAR | Status: DC
  Administered 2017-06-27: 2.1 [IU]/h via INTRAVENOUS
  Filled 2017-06-27 (×2): qty 1

## 2017-06-27 MED ORDER — SODIUM BICARBONATE 8.4 % IV SOLN
50.0000 meq | Freq: Once | INTRAVENOUS | Status: AC
  Administered 2017-06-27: 50 meq via INTRAVENOUS
  Filled 2017-06-27: qty 50

## 2017-06-27 MED ORDER — CHLORHEXIDINE GLUCONATE CLOTH 2 % EX PADS
6.0000 | MEDICATED_PAD | Freq: Every day | CUTANEOUS | Status: DC
  Administered 2017-06-27 – 2017-07-06 (×9): 6 via TOPICAL

## 2017-06-27 MED ORDER — POTASSIUM CHLORIDE 10 MEQ/100ML IV SOLN
10.0000 meq | INTRAVENOUS | Status: AC
  Administered 2017-06-27 (×2): 10 meq via INTRAVENOUS
  Filled 2017-06-27: qty 100

## 2017-06-27 MED ORDER — POTASSIUM CHLORIDE 10 MEQ/50ML IV SOLN
10.0000 meq | INTRAVENOUS | Status: AC
  Administered 2017-06-27 (×4): 10 meq via INTRAVENOUS
  Filled 2017-06-27: qty 50

## 2017-06-27 MED ORDER — INSULIN GLARGINE 100 UNIT/ML ~~LOC~~ SOLN
35.0000 [IU] | SUBCUTANEOUS | Status: DC
  Administered 2017-06-27 – 2017-07-03 (×5): 35 [IU] via SUBCUTANEOUS
  Filled 2017-06-27 (×8): qty 0.35

## 2017-06-27 NOTE — Progress Notes (Signed)
PULMONARY / CRITICAL CARE MEDICINE   Name: Kari [name] MRN: [id number] DOB: [birth year]    ADMISSION DATE:  06/26/2017 CONSULTATION DATE:  06/26/17  REFERRING MD:  Dr. Eulis Foster  CHIEF COMPLAINT:  Cardiac Arrest  HISTORY OF PRESENT ILLNESS: 56 year old female with past medical history significant for asthma, heart failure (11/2016 EF 55-60%) , HTN, Questionable sarcoidosis (based on mediastinal lymphadenopathy no biopsy), HLD, obesity, chronic BLE ulcers, large cystic ovarian mass, headache and depression who presented to [facility] ED on 7/4 in cardiac arrest around 1455.  Family reports no recent complaints.  Reportedly patient had asked a stranger for a ride from the [place] store and went unresponsive in the car.  Bystander and FD CPR was started.  EMS arrived and given one epinephrine and one defibrillation for v-fib.  Estimated 5 -10 mins of CPR were given prior to ROSC.  King airway was placed and patient transferred to the ED where patient remained unresponsive not requiring any sedation.  Hypotensive treated with 3.5 L NS bolus.  Labs noted for glucose 112, Na 138, K 3.1, CO2 19, sCr 1.39 (previously 1.17 on 5/18), BUN 22, AST 160, ALT 108, albumin 3.2, troponin I poc 0.01, Lactic acid 6.91, WBC 8.5, Hgb 11.2, UDS neg, ABG 7.32/39.3/270 on 60% FiO2.  CXR shows pulmonary vascular congestion. EKG non acute.  Definitive airway placed in ED.  Head CT negative. Patient to be transferred to [facility] for targeted temperature management, PCCM to admit.   PAST MEDICAL HISTORY :  She  has a past medical history of Asthma; CHF (congestive heart failure) ([place]) (03/12/2014); Depression; Headache(784.0); Hyperlipidemia; Hypertension; Lung nodules; Renal disorder; Sarcoidosis; and Ulcer.  PAST SURGICAL HISTORY: She  has a past surgical history that includes No past surgeries and cataract surgery (Left, 07-2013).  No Known Allergies  No current facility-administered medications on file prior to encounter.     Current Outpatient Prescriptions on File Prior to Encounter  Medication Sig  . furosemide (LASIX) 40 MG tablet Take 40 mg by mouth daily.  . potassium chloride SA (K-DUR,KLOR-CON) 20 MEQ tablet Take 60 mEq by mouth daily.  Marland Kitchen acetaminophen (TYLENOL) 325 MG tablet Take 2 tablets (650 mg total) by mouth every 6 (six) hours as needed for mild pain (or Fever >/= 101).  Marland Kitchen albuterol (PROVENTIL HFA;VENTOLIN HFA) 108 (90 BASE) MCG/ACT inhaler Inhale 2 puffs into the lungs every 6 (six) hours as needed for shortness of breath.  . Amino Acids-Protein Hydrolys (FEEDING SUPPLEMENT, PRO-STAT SUGAR FREE 64,) LIQD Take 60 mLs by mouth 2 (two) times daily between meals.  Marland Kitchen aspirin EC 81 MG tablet Take 81 mg by mouth daily.  . collagenase (SANTYL) ointment Apply 1 application topically daily. Apply to wound BLE per Tx  . HYDROcodone-acetaminophen (NORCO/VICODIN) 5-325 MG tablet Take 1-2 tablets by mouth every 6 (six) hours as needed for moderate pain.  Marland Kitchen loratadine (CLARITIN) 10 MG tablet Take 10 mg by mouth daily.  . Multiple Vitamin (MULTIVITAMIN WITH MINERALS) TABS tablet Take 1 tablet by mouth daily.  . ondansetron (ZOFRAN) 4 MG tablet Take 1 tablet (4 mg total) by mouth every 6 (six) hours as needed for nausea.    FAMILY HISTORY:  Her indicated that the status of her mother is unknown. She indicated that the status of her father is unknown.    SOCIAL HISTORY: She  reports that she has never smoked. She has never used smokeless tobacco. She reports that she does not drink alcohol or use drugs.  REVIEW OF SYSTEMS:  Unable to assess.  SUBJECTIVE:  No acute overnight events. Reached goal temp 33C. Maintaining.  VITAL SIGNS: BP 120/81   Pulse (!) 56   Temp (!) 91.8 F (33.2 C) (Core (Comment))   Resp (!) 23   Ht 5\' 5"  (1.651 m)   Wt (!) 144.3 kg (318 lb 0.9 oz)   LMP 11/21/2011   SpO2 100%   BMI 52.93 kg/m   HEMODYNAMICS: CVP:  [12 mmHg-18 mmHg] 17 mmHg  VENTILATOR SETTINGS: Vent Mode:  PRVC FiO2 (%):  [60 %-70 %] 70 % Set Rate:  [12 bmp-18 bmp] 18 bmp Vt Set:  [540 mL] 540 mL PEEP:  [5 cmH20] 5 cmH20 Plateau Pressure:  [18 cmH20-30 cmH20] 18 cmH20  INTAKE / OUTPUT: I/O last 3 completed shifts: In: 892.3 [I.V.:642.3; IV Piggyback:250] Out: 1180 [WYOVZ:8588]  PHYSICAL EXAMINATION:  General:  Obese middle aged female on vent Neuro:  Sedated and paralyzed.  HEENT:  [place]/AT, no appreciable JVD, PERRL Cardiovascular:  RRR, no MRG Lungs:  Crackles bases Abdomen:  Soft, non-distended, non-tender Musculoskeletal:  No acute deformity  Skin:  Intact, MMM  LABS:  BMET  Recent Labs Lab 06/26/17 1533  06/26/17 2047 06/26/17 2156 06/27/17 0029 06/27/17 0429  NA 138  < > 138 143 143 137  136  K 3.1*  < > 3.8 3.7 3.3* 3.8  3.7  CL 107  < > 111 110 111 112*  112*  CO2 19*  --  19*  --   --  16*  17*  BUN 22*  < > 20 22* 20 18  19   CREATININE 1.39*  < > 1.31* 1.00 0.90 1.21*  1.18*  GLUCOSE 202*  < > 195* 209* 232* 206*  201*  < > = values in this interval not displayed.  Electrolytes  Recent Labs Lab 06/26/17 1533 06/26/17 2047 06/27/17 0429  CALCIUM 8.4* 8.0* 7.8*  7.8*  MG  --  1.8 1.8  PHOS  --   --  3.3    CBC  Recent Labs Lab 06/26/17 1533  06/26/17 2156 06/27/17 0029 06/27/17 0429  WBC 8.5  --   --   --  7.2  HGB 11.2*  < > 12.2 11.2* 10.9*  HCT 35.4*  < > 36.0 33.0* 35.4*  PLT 259  --   --   --  180  < > = values in this interval not displayed.  Coag's  Recent Labs Lab 06/26/17 2047 06/27/17 0429  APTT 31 30  INR 1.11 1.15    Sepsis Markers  Recent Labs Lab 06/26/17 1541 06/26/17 2047 06/27/17 0429  LATICACIDVEN 6.91* 3.0*  --   PROCALCITON  --  0.19 0.32    ABG  Recent Labs Lab 06/26/17 1820 06/26/17 2058 06/26/17 2326  PHART 7.32* 7.176* 7.297*  PCO2ART 39.3 52.5* 34.1  PO2ART 270* 133.0* 68.0*    Liver Enzymes  Recent Labs Lab 06/26/17 1533  AST 168*  ALT 108*  ALKPHOS 71  BILITOT 0.7   ALBUMIN 3.2*    Cardiac Enzymes  Recent Labs Lab 06/26/17 2047 06/27/17 0429  TROPONINI 0.09* 0.05*    Glucose  Recent Labs Lab 06/27/17 0027 06/27/17 0109 06/27/17 0305 06/27/17 0417 06/27/17 0514 06/27/17 0617  GLUCAP 224* 268* 219* 204* 190* 147*    Imaging Ct Head Wo Contrast  Result Date: 06/26/2017 CLINICAL DATA:  Cardiac arrest. EXAM: CT HEAD WITHOUT CONTRAST TECHNIQUE: Contiguous axial images were obtained from the base of the skull through  the vertex without intravenous contrast. COMPARISON:  08/03/2006. FINDINGS: Brain: There is no evidence for acute hemorrhage, hydrocephalus, mass lesion, or abnormal extra-axial fluid collection. No definite CT evidence for acute infarction. Vascular: No hyperdense vessel or unexpected calcification. Skull: No evidence for fracture. No worrisome lytic or sclerotic lesion. Sinuses/Orbits: The visualized paranasal sinuses and mastoid air cells are clear. Visualized portions of the globes and intraorbital fat are unremarkable. Other: None. IMPRESSION: Stable.  No acute intracranial abnormality. Electronically Signed   By: Misty Stanley M.D.   On: 06/26/2017 16:36   Dg Chest Port 1 View  Result Date: 06/27/2017 CLINICAL DATA:  Status post cardiac arrest. History of acute on chronic CHF, asthma, hypertension, sarcoidosis, nonsmoker. EXAM: PORTABLE CHEST 1 VIEW COMPARISON:  Portable chest x-ray of June 26, 2017 FINDINGS: The lungs are mildly hypoinflated. Confluent alveolar opacities persist inferiorly in the right upper lobe and throughout much of the left lung. The hemidiaphragms are visible. There is no significant pleural effusion. No pneumothorax is observed. The cardiac silhouette is enlarged. The pulmonary vascularity is prominent centrally. The interstitial markings are less conspicuous today. The endotracheal tube tip lies 3.6 cm above the carina. The esophagogastric tube tip in proximal port project below the inferior margin of the  image. The left internal jugular venous catheter tip projects over the cavoatrial junction. External pacemaker defibrillator pads are present. IMPRESSION: Further mild improvement in CHF and pulmonary interstitial and alveolar edema. The support tubes are in reasonable position. Electronically Signed   By: David  Martinique M.D.   On: 06/27/2017 07:17   Dg Chest Port 1 View  Result Date: 06/26/2017 CLINICAL DATA:  Central line placement EXAM: PORTABLE CHEST 1 VIEW COMPARISON:  Chest radiograph 06/26/2017 at 8:39 p.m. FINDINGS: Left internal jugular vein approach central venous catheter tip is at the cavoatrial junction. Endotracheal tube tip is at the level of the clavicular heads. Enteric tube courses beyond the field of view. Cardiomegaly is unchanged. Pulmonary edema is slightly improved but remains moderate. No sizable pleural effusion. IMPRESSION: 1. Left IJ CVC tip at the cavoatrial junction. 2. Cardiomegaly and slightly improved, moderate pulmonary edema. Electronically Signed   By: Ulyses Jarred M.D.   On: 06/26/2017 22:32   Dg Chest Port 1 View  Result Date: 06/26/2017 CLINICAL DATA:  Acute respiratory failure with hypoxia. EXAM: PORTABLE CHEST 1 VIEW COMPARISON:  Radiograph earlier this day at 1512 hour FINDINGS: Endotracheal tube 2.9 cm from the carina. Enteric tube in place. Again seen cardiomegaly. Worsening vascular congestion with development perihilar edema. Possible pleural effusions with blunting of costophrenic angle. No pneumothorax. IMPRESSION: 1. Worsening vascular congestion with development of perihilar pulmonary edema. 2. Stable cardiomegaly. Electronically Signed   By: Jeb Levering M.D.   On: 06/26/2017 21:26   Dg Chest Portable 1 View  Result Date: 06/26/2017 CLINICAL DATA:  Intubation. EXAM: PORTABLE CHEST 1 VIEW COMPARISON:  04/01/2017 FINDINGS: New endotracheal tube with tip at the lower margin of the clavicular heads. An orogastric tube reaches the stomach. Cardiomegaly that  is chronic and similar to prior when allowing for differences in technique. Low lung volumes with perihilar atelectasis and vascular congestion. No edema, effusion, or visible pneumothorax. IMPRESSION: 1. New endotracheal and orogastric tubes in good position. 2. Low volume chest with perihilar atelectasis and vascular congestion. 3. Cardiomegaly. Electronically Signed   By: Monte Fantasia M.D.   On: 06/26/2017 15:25   STUDIES:  11/2016 TTE >> LVEF 55-60%, G1DD,  Mild AR, mild MR, mildly dilated LA, PAP  47 mm Hg EKG 7/4 >> ST 117, diffuse STD CXR 7/4 >> ETT at clavicular heads, OGT in stomach, chronic cardiomegaly, low volume chest with perihilar atelectasis and vascular congestion  TTE >> EEG>>  CULTURES: 7/4 BCx2 >> 7/4 Pierz >>  ANTIBIOTICS: none  SIGNIFICANT EVENTS: 7/4 Admit Vfib arrest  LINES/TUBES: PIV  x2  Left tibial IO 7/4 ETT >> 7/4 OGT >> 7/4 foley >>  DISCUSSION: 26KNL with diastolic heart failure, obesity, HTN, Asthma, Sarcoidosis, with witnessed VF arrest.  5-10 mins of CPR, 1 epi, and 1 defib given prior to ROSC.  Patient unresponsive following and therefore TTM started. Unknown of any patient complaints prior to arrest.   ASSESSMENT / PLAN:  PULMONARY A: Acute hypoxic respiratory failure - Pulmonary edeme Acute Hypercapneic respiratory failure Aspiration pneumonia Asthma exacerbation? - reportedly was asymptomatic prior to the arrest, so do not feel this arrest was caused by an asthma exacerbation. However, wheezing on presentation. P: - Full vent support - ABG - Trend CXR - VAP bundle - Will start Solumedrol, Duonebs, Pulmicort, Brovana.  - Wean FIO2 as tolerated - NPO  CARDIOVASCULAR A: Vfib Cardiac Arrest:estimated  5-10 min of CPR prior to ROSC; not at neurologic baseline so started therapeutic hypothermia (with ice bags at outside hospital, T:35.7 on arrival). Now has arctic sun pads in place to continue cooling. Unclear if this arrest was due to  coronary ischemia vs a primary arrhythmia; unclear the history of her sarcoidosis and if she could've had any cardiac involvement which would predispose to arrhythmia.  Acute CHF with history of diastolic HF (97/67 EF 34-19%) HTN HLD  ? 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  - Repeat ABG - Start bicarb infusion at low rate pending ABG - Correct electrolytes as indicated  GASTROINTESTINAL A: No acute issues  P: - NPO - PPI - Start TF post warming  HEMATOLOGIC A: Anemia:  P: SQ heparin  INFECTIOUS A: Aspiration PNA  P: - Blood, sputum cultures - Empiric Vanc and Zosyn - Check procal  ENDOCRINE  A: Hyperglycemia without DM history  P: - Continue SSI  NEUROLOGIC A: Anoxic metabolic vs anoxic encephalopathy- head CT neg, UDS neg  P: - RASS goal: -4 - TTM at 33 degrees - Fentanyl and versed for sedation - Nimbex gtt low dose for shivering - EEG being done today  GU A: Large cystic ovarian mass:  P: - Diagnosed in 03/2017, surgery postponed due to acute issues with AKI and Hypokalemia at that time.  - Unclear if malignant vs benign at this point   FAMILY  - Updates: Updated bedside 7/5 - Inter-disciplinary family meet or Palliative Care meeting due by:  07/03/17   Georgann Housekeeper, AGACNP-BC [place] [facility]/Critical Care Pager [phone] or [phone]  06/27/2017 7:42 AM  Attending Note:  56 year old female with PMH of diastolic heart failure who presents with cardiac arrest who is undergoing hypothermia protocol.  On exam, sedated, intubated and with crackles on lung exam.  I reviewed CXR myself ETT is ok and pulmonary edema noted.  Will increase RR from 18 to 24, increase PEEP to 10 and decreased FiO2 to  50%.  F/U ABG as ordered.  F/U CXR in AM.  Continue pressor support and  bicarb drip for now.  Will re-evaluate neuro status once hypothermia protocol is complete.  The patient is critically ill with multiple organ systems failure and requires high complexity decision making for assessment and support, frequent evaluation and titration of therapies, application of advanced monitoring technologies and extensive interpretation of multiple databases.   Critical Care Time devoted to patient care services described in this note is  35  Minutes. This time reflects time of care of this signee Dr Jennet Maduro. This critical care time does not reflect procedure time, or teaching time or supervisory time of PA/NP/Med student/Med Resident etc but could involve care discussion time.  Rush Farmer, M.D. [facility] Pulmonary/Critical Care Medicine. Pager: [phone]. After hours pager: [phone].

## 2017-06-27 NOTE — Procedures (Signed)
Arterial Catheter Insertion Procedure Note JOANELL CRESSLER [id number] 16-Aug-1961  Procedure: Insertion of Arterial Catheter  Indications: Blood pressure monitoring and Frequent blood sampling  Procedure Details Consent: Unable to obtain consent because of altered level of consciousness. Time Out: Verified patient identification, verified procedure, site/side was marked, verified correct patient position, special equipment/implants available, medications/allergies/relevent history reviewed, required imaging and test results available.  Performed  Maximum sterile technique was used including antiseptics, cap, gloves, gown, hand hygiene, mask and sheet. Skin prep: Chlorhexidine; local anesthetic administered 20 gauge catheter was inserted into right femoral artery using the Seldinger technique.  Line sutured.  Biopatch and sterile dressing applied.    Evaluation Blood flow good; BP tracing good. Complications: No apparent complications.  Procedure performed under ultrasound guidance for real time vessel cannulation.      Kennieth Rad, AGACNP-BC [place] [facility] Pgr: [phone] or if no answer [phone] 06/27/2017, 2:43 AM

## 2017-06-27 NOTE — Progress Notes (Signed)
Progress Note  Patient Name: URVI IMES Date of Encounter: 06/27/2017  Primary Cardiologist: Dr Harrington Challenger  Subjective   Intubated, sedated  Inpatient Medications    Scheduled Meds: . arformoterol  15 mcg Nebulization BID  . artificial tears  1 application Both Eyes Z1I  . budesonide (PULMICORT) nebulizer solution  0.5 mg Nebulization BID  . chlorhexidine gluconate (MEDLINE KIT)  15 mL Mouth Rinse BID  . Chlorhexidine Gluconate Cloth  6 each Topical Daily  . heparin  5,000 Units Subcutaneous Q8H  . ipratropium-albuterol  3 mL Nebulization Q6H  . mouth rinse  15 mL Mouth Rinse 10 times per day  . methylPREDNISolone (SOLU-MEDROL) injection  60 mg Intravenous Q6H  . midazolam  2 mg Intravenous Once  . sodium chloride flush  10-40 mL Intracatheter Q12H   Continuous Infusions: . sodium chloride 10 mL/hr at 06/27/17 0600  . amiodarone 30 mg/hr (06/27/17 0600)  . cisatracurium (NIMBEX) infusion 1.005 mcg/kg/min (06/27/17 0600)  . famotidine (PEPCID) IV Stopped (06/26/17 2148)  . fentaNYL infusion INTRAVENOUS 100 mcg/hr (06/27/17 0600)  . insulin (NOVOLIN-R) infusion 4.1 Units/hr (06/27/17 0709)  . midazolam (VERSED) infusion 2 mg/hr (06/27/17 0600)  . norepinephrine (LEVOPHED) Adult infusion Stopped (06/26/17 2146)  . piperacillin-tazobactam (ZOSYN)  IV 3.375 g (06/27/17 4580)  . vancomycin     PRN Meds: [COMPLETED] cisatracurium **AND** cisatracurium (NIMBEX) infusion **AND** cisatracurium, fentaNYL, midazolam, sodium chloride flush   Vital Signs    Vitals:   06/27/17 0415 06/27/17 0500 06/27/17 0545 06/27/17 0600  BP:    120/81  Pulse: (!) 34 (!) 53 (!) 47 (!) 56  Resp: _0 (!) 23  Temp: (!) 91.6 F (33.1 C) (!) 91.6 F (33.1 C)  (!) 91.8 F (33.2 C)  TempSrc:  Core (Comment)  Core (Comment)  SpO2: 100% 100% 100% 100%  Weight:   (!) 144.3 kg (318 lb 0.9 oz)   Height:        Intake/Output Summary (Last 24 hours) at 06/27/17 9983 Last data filed at 06/27/17  0600  Gross per 24 hour  Intake           892.31 ml  Output             1180 ml  Net          -287.69 ml   Filed Weights   06/26/17 2000 06/27/17 0545  Weight: (!) 156.6 kg (345 lb 3.9 oz) (!) 144.3 kg (318 lb 0.9 oz)    Telemetry     Sinus brady with AIVR, PAF and PVCs- Personally Reviewed  Physical Exam   GEN: Obese; intubated; sedated Neck: Difficult; no JVD Cardiac: Bradycardic, regular Respiratory: Diffuse rhonchi GI: Soft, non-distended  MS: Lower ext wrapped for chronic ulcers Neuro: Intubated and sedated   Labs    Chemistry Recent Labs Lab 06/26/17 1533  06/26/17 2047 06/26/17 2156 06/27/17 0029 06/27/17 0429  NA 138  < > 138 143 143 137  136  K 3.1*  < > 3.8 3.7 3.3* 3.8  3.7  CL 107  < > 111 110 111 112*  112*  CO2 19*  --  19*  --   --  16*  17*  GLUCOSE 202*  < > 195* 209* 232* 206*  201*  BUN 22*  < > 20 22* _1 CREATININE 1.39*  < > 1.31* 1.00 0.90 1.21*  1.18*  CALCIUM 8.4*  --  8.0*  --   --  7.8*  7.8*  PROT 7.9  --   --   --   --   --   ALBUMIN 3.2*  --   --   --   --   --   AST 168*  --   --   --   --   --   ALT 108*  --   --   --   --   --   ALKPHOS 71  --   --   --   --   --   BILITOT 0.7  --   --   --   --   --   GFRNONAA 42*  --  45*  --   --  49*  51*  GFRAA 48*  --  52*  --   --  57*  59*  ANIONGAP 12  --  8  --   --  9  7  < > = values in this interval not displayed.   Hematology Recent Labs Lab 06/26/17 1533  06/26/17 2156 06/27/17 0029 06/27/17 0429  WBC 8.5  --   --   --  7.2  RBC 3.80*  --   --   --  3.84*  HGB 11.2*  < > 12.2 11.2* 10.9*  HCT 35.4*  < > 36.0 33.0* 35.4*  MCV 93.2  --   --   --  92.2  MCH 29.5  --   --   --  28.4  MCHC 31.6  --   --   --  30.8  RDW 14.6  --   --   --  14.5  PLT 259  --   --   --  180  < > = values in this interval not displayed.  Cardiac Enzymes Recent Labs Lab 06/26/17 2047 06/27/17 0429  TROPONINI 0.09* 0.05*    Recent Labs Lab 06/26/17 1539  TROPIPOC  0.01      Radiology    Ct Head Wo Contrast  Result Date: 06/26/2017 CLINICAL DATA:  Cardiac arrest. EXAM: CT HEAD WITHOUT CONTRAST TECHNIQUE: Contiguous axial images were obtained from the base of the skull through the vertex without intravenous contrast. COMPARISON:  08/03/2006. FINDINGS: Brain: There is no evidence for acute hemorrhage, hydrocephalus, mass lesion, or abnormal extra-axial fluid collection. No definite CT evidence for acute infarction. Vascular: No hyperdense vessel or unexpected calcification. Skull: No evidence for fracture. No worrisome lytic or sclerotic lesion. Sinuses/Orbits: The visualized paranasal sinuses and mastoid air cells are clear. Visualized portions of the globes and intraorbital fat are unremarkable. Other: None. IMPRESSION: Stable.  No acute intracranial abnormality. Electronically Signed   By: Misty Stanley M.D.   On: 06/26/2017 16:36   Dg Chest Port 1 View  Result Date: 06/27/2017 CLINICAL DATA:  Status post cardiac arrest. History of acute on chronic CHF, asthma, hypertension, sarcoidosis, nonsmoker. EXAM: PORTABLE CHEST 1 VIEW COMPARISON:  Portable chest x-ray of June 26, 2017 FINDINGS: The lungs are mildly hypoinflated. Confluent alveolar opacities persist inferiorly in the right upper lobe and throughout much of the left lung. The hemidiaphragms are visible. There is no significant pleural effusion. No pneumothorax is observed. The cardiac silhouette is enlarged. The pulmonary vascularity is prominent centrally. The interstitial markings are less conspicuous today. The endotracheal tube tip lies 3.6 cm above the carina. The esophagogastric tube tip in proximal port project below the inferior margin of the image. The left internal jugular venous catheter tip projects over the cavoatrial junction. External pacemaker defibrillator pads are  present. IMPRESSION: Further mild improvement in CHF and pulmonary interstitial and alveolar edema. The support tubes are in  reasonable position. Electronically Signed   By: David  Martinique M.D.   On: 06/27/2017 07:17   Dg Chest Port 1 View  Result Date: 06/26/2017 CLINICAL DATA:  Central line placement EXAM: PORTABLE CHEST 1 VIEW COMPARISON:  Chest radiograph 06/26/2017 at 8:39 p.m. FINDINGS: Left internal jugular vein approach central venous catheter tip is at the cavoatrial junction. Endotracheal tube tip is at the level of the clavicular heads. Enteric tube courses beyond the field of view. Cardiomegaly is unchanged. Pulmonary edema is slightly improved but remains moderate. No sizable pleural effusion. IMPRESSION: 1. Left IJ CVC tip at the cavoatrial junction. 2. Cardiomegaly and slightly improved, moderate pulmonary edema. Electronically Signed   By: Ulyses Jarred M.D.   On: 06/26/2017 22:32   Dg Chest Port 1 View  Result Date: 06/26/2017 CLINICAL DATA:  Acute respiratory failure with hypoxia. EXAM: PORTABLE CHEST 1 VIEW COMPARISON:  Radiograph earlier this day at 1512 hour FINDINGS: Endotracheal tube 2.9 cm from the carina. Enteric tube in place. Again seen cardiomegaly. Worsening vascular congestion with development perihilar edema. Possible pleural effusions with blunting of costophrenic angle. No pneumothorax. IMPRESSION: 1. Worsening vascular congestion with development of perihilar pulmonary edema. 2. Stable cardiomegaly. Electronically Signed   By: Jeb Levering M.D.   On: 06/26/2017 21:26   Dg Chest Portable 1 View  Result Date: 06/26/2017 CLINICAL DATA:  Intubation. EXAM: PORTABLE CHEST 1 VIEW COMPARISON:  04/01/2017 FINDINGS: New endotracheal tube with tip at the lower margin of the clavicular heads. An orogastric tube reaches the stomach. Cardiomegaly that is chronic and similar to prior when allowing for differences in technique. Low lung volumes with perihilar atelectasis and vascular congestion. No edema, effusion, or visible pneumothorax. IMPRESSION: 1. New endotracheal and orogastric tubes in good position.  2. Low volume chest with perihilar atelectasis and vascular congestion. 3. Cardiomegaly. Electronically Signed   By: Monte Fantasia M.D.   On: 06/26/2017 15:25    Patient Profile     56 y.o. female with past medical history of sarcoid, hypertension, hyperlipidemia, diastolic congestive heart failure, asthma admitted following cardiac arrest. Patient received 5-10 minutes of CPR following ventricular fibrillation arrest. She required one defibrillation to sinus rhythm.  Assessment & Plan    1 status post cardiac arrest-patient required defibrillation 1. Biggest issue at present is neurological recovery. If she improves she will need further cardiac workup including cardiac catheterization and given history of sarcoid, cardiac MRI. Troponin is minimally elevated but not consistent with acute coronary syndrome as there is no clear trend. Treat with aspirin and continue DVT prophylaxis subcutaneous heparin for now. Blood pressure will not allow additional cardiac medications. Check echocardiogram for LV function.   2 paroxysmal atrial fibrillation-noted on telemetry. Continue amiodarone for now. If she improves and once all procedures complete she would require long-term anticoagulation as CHADSvasc 3.  3 VDRF-per CCM. Presently being cooled.  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 Follow neurological status closely. If no improvement she may require EEG and neurology eval.  5 possible aspiration-continue antibiotics.  6 large cystic ovarian mass  Signed, Kirk Ruths, MD  06/27/2017, 7:22 AM

## 2017-06-27 NOTE — Progress Notes (Signed)
EEG Completed; Results Pending  

## 2017-06-27 NOTE — Procedures (Signed)
ELECTROENCEPHALOGRAM REPORT  Date of Study: 06/27/2017  Patient's Name: Kari [name] MRN: [id number] Date of Birth: May 19, 1961  Referring Provider: Jennelle Human, NP  Clinical History: This is a 56 year old woman s/p cardiac arrest, on hypothermia protocol  Medications: fentaNYL 2523mcg in NS 24mL (67mcg/ml) infusion-PREMIX  midazolam (VERSED) 50 mg in sodium chloride 0.9 % 50 mL (1 mg/mL) infusion  amiodarone (NEXTERONE PREMIX) 360-4.14 MG/200ML-% (1.8 mg/mL) IV infusion  arformoterol (BROVANA) nebulizer solution 15 mcg  aspirin suppository 300 mg  budesonide (PULMICORT) nebulizer solution 0.5 mg  cisatracurium (NIMBEX) 200 mg in sodium chloride 0.9 % 200 mL (1 mg/mL) infusion  insulin regular (NOVOLIN R,HUMULIN R) 100 Units in sodium chloride 0.9 % 100 mL (1 Units/mL) infusion  ipratropium-albuterol (DUONEB) 0.5-2.5 (3) MG/3ML nebulizer solution 3 mL  methylPREDNISolone sodium succinate (SOLU-MEDROL) 125 mg/2 mL injection 60 mg  norepinephrine (LEVOPHED) 4 mg in dextrose 5 % 250 mL (0.016 mg/mL) infusion  piperacillin-tazobactam (ZOSYN) IVPB 3.375 g  vancomycin (VANCOCIN) 1,250 mg in sodium chloride 0.9 % 250 mL IVPB   Technical Summary: A multichannel digital EEG recording measured by the international 10-20 system with electrodes applied with paste and impedances below 5000 ohms performed as portable with EKG monitoring in an intubated and sedated patient on hypothermia protocol, at 33.1 degrees Celsius.  Hyperventilation and photic stimulation were not performed.  The digital EEG was referentially recorded, reformatted, and digitally filtered in a variety of bipolar and referential montages for optimal display.   Description: The patient is intubated and sedated on Versed and Fentanyl during the recording. There is no clear posterior dominant rhythm. The background consists of a large amount of diffuse 4-6 Hz theta and occasional 2-3 Hz delta slowing admixed with alpha and  beta frequencies. Normal sleep architecture was not seen. Hyperventilation and photic stimulation were not performed.  There were no epileptiform discharges or electrographic seizures seen.    EKG lead showed sinus bradycardia at 54 bpm.  Impression: This sedated EEG is abnormal due to moderate diffuse slowing of the background.  Clinical Correlation of the above findings indicates diffuse cerebral dysfunction that is non-specific in etiology and can be seen with hypoxic/ischemic injury, toxic/metabolic encephalopathies, or medication/cooling effect. There were no electrographic seizures in this study. Clinical correlation is advised.   Ellouise Newer, M.D.

## 2017-06-27 NOTE — Care Management Note (Signed)
Case Management Note  Patient Details  Name: Kari [name] MRN: [id number] Date of Birth: 09-30-1961  Subjective/Objective:      7/4 pt cardiac arrested .... . Hx of asthma, heart failure (11/2016 EF 55-60%) , HTN, Questionable sarcoidosis ,HLD, obesity, chronic BLE ulcers, large cystic ovarian mass, headache and depression.  CM spoke with brother Fritz Pickerel regarding d/c planning. Fritz Pickerel stated pt lived alone PTA and received home health services (RN,PT,OT) from [facility]  PCP: Brandon Melnick Jacky Kindle (Brother) Maxyne Derocher (sister in law)    [phone]       Action/Plan: On hypothermia protocol......Marland KitchenCM following for disposition needs.  Expected Discharge Date:                  Expected Discharge Plan:  Home/Self Care  In-House Referral:     Discharge planning Services  CM Consult  Post Acute Care Choice:    Choice offered to:     DME Arranged:    DME Agency:     HH Arranged:   RN,PT,OT [facility] (active PTA per brother Fritz Pickerel)  Status of Service:  In process, will continue to follow  If discussed at Long Length of Stay Meetings, dates discussed:    Additional Comments:  Sharin Mons, RN 06/27/2017, 1:46 PM

## 2017-06-27 NOTE — Consult Note (Signed)
[facility] Nurse wound consult note Reason for Consult: bilateral LEs. Patient last seen by this [facility] nurse in April of this year for LE ulcerations.  At that time the wounds were circumferential and highly necrotic.  Patient with evidence of lymphedema bilaterally  Wound type: venous stasis  Pressure Injury POA: NA Measurement: R lateral: 8.5cm x 10.5cm x 0.1cm; 50% black/grey soft, 50% pink, ruddy Left posterior: 11cm x 6cm x 0.2cm; 75% grey/black soft, 25% pink, ruddy Wound bed: see above Drainage (amount, consistency, odor) moderate, serosanguinous, not foul or purulent  Periwound: intact, with evidence of healing/scarring. Less edema now than my previous assessment in April 2018 Dressing procedure/placement/frequency: Enzymatic debridement ointment to the necrotic tissue, cover with saline moist gauze, top with dry dressing, secure with ACE wrap and change daily. Elevate legs as much as possible.  Will need follow up in a wound care center of her choice at [place] if she survives this admission.  Needs follow up for long term compression needs to maintain her legs and keep them free from open wounds.   [facility] Nurse team will follow along with you for weekly wound assessments.  Please notify me of any acute changes in the wounds or any new areas of concerns [place] MSN, RN,CWOCN, [place], [place]

## 2017-06-27 NOTE — Progress Notes (Signed)
  Echocardiogram 2D Echocardiogram has been performed.  Kari [name] 06/27/2017, 10:20 AM

## 2017-06-27 NOTE — Progress Notes (Signed)
Initial Nutrition Assessment  DOCUMENTATION CODES:   Morbid obesity  INTERVENTION:   -Once pt rewarmed, recommend initiation of TF. TF recommendations:   Vital High Protein @ goal rate of 50 ml/hr with Pro-Stat 30 mL BID providing 136 g of protein, 1400 kcals and 1008 mL of free water. Meets 100% estimated calorie and protein needs.    NUTRITION DIAGNOSIS:   Inadequate oral intake related to acute illness as evidenced by NPO status.  GOAL:   Provide needs based on ASPEN/SCCM guidelines  MONITOR:   Vent status, Labs, Weight trends, Skin, I & O's  REASON FOR ASSESSMENT:   Ventilator    ASSESSMENT:   56 yo female admitted with witness VF arrest, acute respiratory failure with possible aspiration pneumonia requiring intubation, started on TTM. Pt with AKI on CKD (baseline 1.15-1.25).  Pt with hx of asthma, heart failure (EF 55-60% 11/2016), HTN, questionable sarcoidosis, HL, chronic BLE ulcers, very large cystic ovarian mass, depression  TTM 33 degrees C, plan to begin rewarming at 2230 today. Fentanyl/versed for sedation, on nimbex as well Levophed ordered but never started   OG to LIS with 250 mL out  No family at bedside, pt sedated and unable to provide diet or weight history. Per weight encounters, no recent weight loss  Nutrition-Focused physical exam completed. Findings are fat depletion, nomuscle depletion, and mild edema.   Labs: CBGs 138-148, BMP pending for today Meds: insulin drip, solumedrol, KCl, sodium bicarb at 50 ml/hr  Diet Order:   NPO  Skin:  Wound (see comment) (chronic venous stasis leg ulcers); per Nira Conn RN, wound nurse indicates that these are improved from previous admission  Last BM:  no documented BM  Height:   Ht Readings from Last 1 Encounters:  06/26/17 5\' 5"  (1.651 m)    Weight:   Wt Readings from Last 1 Encounters:  06/27/17 (!) 318 lb 0.9 oz (144.3 kg)    Ideal Body Weight:  56.8 kg  BMI:  Body mass index is 52.93  kg/m.  Estimated Nutritional Needs:   Kcal:  1250-1420 kcals   Protein:  114-142 g  Fluid:  per MD  EDUCATION NEEDS:   No education needs identified at this time  [place], [place], LDN [phone] Pager  [phone] Weekend/On-Call Pager

## 2017-06-27 NOTE — Progress Notes (Signed)
Potassium 3.2 Georgann Housekeeper, NP notified, replacement orders to be placed.   Rowe Pavy, RN

## 2017-06-27 NOTE — Progress Notes (Signed)
Sputum sample obtained and sent to lab by RT. 

## 2017-06-27 NOTE — Progress Notes (Signed)
[facility] Progress Note Patient Name: Kari [name] DOB: 04-30-1961 MRN: [id number]   Date of Service  06/27/2017  HPI/Events of Note  K+ = 3.2 and Creatinine = 1.13.   eICU Interventions  Will replace K+.     Intervention Category Major Interventions: Electrolyte abnormality - evaluation and management  Sommer,Steven Eugene 06/27/2017, 8:18 PM

## 2017-06-28 ENCOUNTER — Inpatient Hospital Stay (HOSPITAL_COMMUNITY): Payer: Medicare Other

## 2017-06-28 DIAGNOSIS — R579 Shock, unspecified: Secondary | ICD-10-CM

## 2017-06-28 LAB — CBC
HCT: 34.5 % — ABNORMAL LOW (ref 36.0–46.0)
Hemoglobin: 11.2 g/dL — ABNORMAL LOW (ref 12.0–15.0)
MCH: 28.6 pg (ref 26.0–34.0)
MCHC: 32.5 g/dL (ref 30.0–36.0)
MCV: 88 fL (ref 78.0–100.0)
Platelets: 226 10*3/uL (ref 150–400)
RBC: 3.92 MIL/uL (ref 3.87–5.11)
RDW: 14.4 % (ref 11.5–15.5)
WBC: 17.5 10*3/uL — ABNORMAL HIGH (ref 4.0–10.5)

## 2017-06-28 LAB — MAGNESIUM: Magnesium: 1.7 mg/dL (ref 1.7–2.4)

## 2017-06-28 LAB — BASIC METABOLIC PANEL
Anion gap: 8 (ref 5–15)
Anion gap: 5 (ref 5–15)
Anion gap: 8 (ref 5–15)
Anion gap: 7 (ref 5–15)
BUN: 19 mg/dL (ref 6–20)
BUN: 19 mg/dL (ref 6–20)
BUN: 19 mg/dL (ref 6–20)
BUN: 20 mg/dL (ref 6–20)
CO2: 19 mmol/L — ABNORMAL LOW (ref 22–32)
CO2: 21 mmol/L — ABNORMAL LOW (ref 22–32)
CO2: 19 mmol/L — ABNORMAL LOW (ref 22–32)
CO2: 21 mmol/L — ABNORMAL LOW (ref 22–32)
Calcium: 8.2 mg/dL — ABNORMAL LOW (ref 8.9–10.3)
Calcium: 8.2 mg/dL — ABNORMAL LOW (ref 8.9–10.3)
Calcium: 8 mg/dL — ABNORMAL LOW (ref 8.9–10.3)
Calcium: 8.3 mg/dL — ABNORMAL LOW (ref 8.9–10.3)
Chloride: 110 mmol/L (ref 101–111)
Chloride: 110 mmol/L (ref 101–111)
Chloride: 109 mmol/L (ref 101–111)
Chloride: 110 mmol/L (ref 101–111)
Creatinine, Ser: 1.03 mg/dL — ABNORMAL HIGH (ref 0.44–1.00)
Creatinine, Ser: 1.1 mg/dL — ABNORMAL HIGH (ref 0.44–1.00)
Creatinine, Ser: 1.18 mg/dL — ABNORMAL HIGH (ref 0.44–1.00)
Creatinine, Ser: 1.29 mg/dL — ABNORMAL HIGH (ref 0.44–1.00)
GFR calc Af Amer: >60 mL/min (ref >60)
GFR calc Af Amer: >60 mL/min (ref >60)
GFR calc Af Amer: 59 mL/min — ABNORMAL LOW (ref >60)
GFR calc Af Amer: 53 mL/min — ABNORMAL LOW (ref >60)
GFR calc non Af Amer: >60 mL/min (ref >60)
GFR calc non Af Amer: 55 mL/min — ABNORMAL LOW (ref >60)
GFR calc non Af Amer: 51 mL/min — ABNORMAL LOW (ref >60)
GFR calc non Af Amer: 46 mL/min — ABNORMAL LOW (ref >60)
Glucose, Bld: 165 mg/dL — ABNORMAL HIGH (ref 65–99)
Glucose, Bld: 149 mg/dL — ABNORMAL HIGH (ref 65–99)
Glucose, Bld: 127 mg/dL — ABNORMAL HIGH (ref 65–99)
Glucose, Bld: 130 mg/dL — ABNORMAL HIGH (ref 65–99)
Potassium: 3.4 mmol/L — ABNORMAL LOW (ref 3.5–5.1)
Potassium: 3.3 mmol/L — ABNORMAL LOW (ref 3.5–5.1)
Potassium: 3.8 mmol/L (ref 3.5–5.1)
Potassium: 4.1 mmol/L (ref 3.5–5.1)
Sodium: 137 mmol/L (ref 135–145)
Sodium: 136 mmol/L (ref 135–145)
Sodium: 136 mmol/L (ref 135–145)
Sodium: 138 mmol/L (ref 135–145)

## 2017-06-28 LAB — GLUCOSE, CAPILLARY
Glucose-Capillary: 104 mg/dL — ABNORMAL HIGH (ref 65–99)
Glucose-Capillary: 114 mg/dL — ABNORMAL HIGH (ref 65–99)
Glucose-Capillary: 139 mg/dL — ABNORMAL HIGH (ref 65–99)
Glucose-Capillary: 142 mg/dL — ABNORMAL HIGH (ref 65–99)
Glucose-Capillary: 143 mg/dL — ABNORMAL HIGH (ref 65–99)
Glucose-Capillary: 146 mg/dL — ABNORMAL HIGH (ref 65–99)
Glucose-Capillary: 147 mg/dL — ABNORMAL HIGH (ref 65–99)
Glucose-Capillary: 174 mg/dL — ABNORMAL HIGH (ref 65–99)

## 2017-06-28 LAB — BLOOD GAS, ARTERIAL
Acid-base deficit: 3.1 mmol/L — ABNORMAL HIGH (ref 0.0–2.0)
Bicarbonate: 19.9 mmol/L — ABNORMAL LOW (ref 20.0–28.0)
Drawn by: 252031
FIO2: 40
MECHVT: 540 mL
O2 Saturation: 98.8 %
PEEP: 5 cmH2O
Patient temperature: 98.6
RATE: 24 resp/min
pCO2 arterial: 26.9 mmHg — ABNORMAL LOW (ref 32.0–48.0)
pH, Arterial: 7.482 — ABNORMAL HIGH (ref 7.350–7.450)
pO2, Arterial: 120 mmHg — ABNORMAL HIGH (ref 83.0–108.0)

## 2017-06-28 LAB — PROCALCITONIN: Procalcitonin: 0.33 ng/mL

## 2017-06-28 LAB — PHOSPHORUS: Phosphorus: 2 mg/dL — ABNORMAL LOW (ref 2.5–4.6)

## 2017-06-28 MED ORDER — DEXMEDETOMIDINE HCL IN NACL 400 MCG/100ML IV SOLN
0.4000 ug/kg/h | INTRAVENOUS | Status: DC
  Administered 2017-06-28 – 2017-06-29 (×3): 0.7 ug/kg/h via INTRAVENOUS
  Administered 2017-06-29: 0.5 ug/kg/h via INTRAVENOUS
  Administered 2017-06-29: 0.6 ug/kg/h via INTRAVENOUS
  Administered 2017-06-29: 0.7 ug/kg/h via INTRAVENOUS
  Administered 2017-06-30: 0.4 ug/kg/h via INTRAVENOUS
  Administered 2017-06-30: 0.3 ug/kg/h via INTRAVENOUS
  Administered 2017-06-30: 0.5 ug/kg/h via INTRAVENOUS
  Administered 2017-07-01: 0.2 ug/kg/h via INTRAVENOUS
  Filled 2017-06-28 (×13): qty 100

## 2017-06-28 MED ORDER — SODIUM PHOSPHATES 45 MMOLE/15ML IV SOLN
10.0000 mmol | Freq: Once | INTRAVENOUS | Status: AC
  Administered 2017-06-28: 10 mmol via INTRAVENOUS
  Filled 2017-06-28: qty 3.33

## 2017-06-28 MED ORDER — SODIUM CHLORIDE 0.9 % IV SOLN
0.4000 ug/kg/h | INTRAVENOUS | Status: DC
  Administered 2017-06-28: 0.7 ug/kg/h via INTRAVENOUS
  Administered 2017-06-28: 0.4 ug/kg/h via INTRAVENOUS
  Administered 2017-06-28: 1.2 ug/kg/h via INTRAVENOUS
  Administered 2017-06-28: 0.7 ug/kg/h via INTRAVENOUS
  Filled 2017-06-28 (×4): qty 2

## 2017-06-28 NOTE — Progress Notes (Signed)
PULMONARY / CRITICAL CARE MEDICINE   Name: Kari [name] MRN: [id number] DOB: 12/23/1961    ADMISSION DATE:  06/26/2017 CONSULTATION DATE:  06/26/17  REFERRING MD:  Dr. Eulis Foster  CHIEF COMPLAINT:  Cardiac Arrest  HISTORY OF PRESENT ILLNESS: 56 year old female with past medical history significant for asthma, heart failure (11/2016 EF 55-60%) , HTN, Questionable sarcoidosis (based on mediastinal lymphadenopathy no biopsy), HLD, obesity, chronic BLE ulcers, large cystic ovarian mass, headache and depression who presented to [facility] ED on 7/4 in cardiac arrest around 1455.  Family reports no recent complaints.  Reportedly patient had asked a stranger for a ride from the [place] store and went unresponsive in the car.  Bystander and FD CPR was started.  EMS arrived and given one epinephrine and one defibrillation for v-fib.  Estimated 5 -10 mins of CPR were given prior to ROSC.  King airway was placed and patient transferred to the ED where patient remained unresponsive not requiring any sedation.  Hypotensive treated with 3.5 L NS bolus.  Labs noted for glucose 112, Na 138, K 3.1, CO2 19, sCr 1.39 (previously 1.17 on 5/18), BUN 22, AST 160, ALT 108, albumin 3.2, troponin I poc 0.01, Lactic acid 6.91, WBC 8.5, Hgb 11.2, UDS neg, ABG 7.32/39.3/270 on 60% FiO2.  CXR shows pulmonary vascular congestion. EKG non acute.  Definitive airway placed in ED.  Head CT negative. Patient to be transferred to [facility] for targeted temperature management, PCCM to admit.   SUBJECTIVE:  No acute overnight events. Remains paralyzed with hypothermia protocol.  VITAL SIGNS: BP 133/75   Pulse 92   Temp (!) 96.4 F (35.8 C) (Core (Comment))   Resp (!) 24   Ht 5\' 5"  (1.651 m)   Wt (!) 144.2 kg (317 lb 13.4 oz)   LMP 11/21/2011   SpO2 100%   BMI 52.89 kg/m   HEMODYNAMICS: CVP:  [9 mmHg-27 mmHg] 27 mmHg  VENTILATOR SETTINGS: Vent Mode: PRVC FiO2 (%):  [40 %-50 %] 40 % Set Rate:  [24 bmp] 24 bmp Vt Set:  [540 mL]  540 mL PEEP:  [5 cmH20] 5 cmH20 Plateau Pressure:  [19 cmH20-28 cmH20] 28 cmH20  INTAKE / OUTPUT: I/O last 3 completed shifts: In: 4079.2 [I.V.:3079.2; IV Piggyback:1000] Out: 2095 [Urine:2095]  PHYSICAL EXAMINATION:  General:  Obese middle aged female on vent Neuro:  Sedated and paralyzed.  HEENT:  [place]/AT, no appreciable JVD, PERRL Cardiovascular:  RRR, no MRG Lungs:  Diffuse crackles Abdomen:  Soft, NT, ND and hypoactive bowel sounds Musculoskeletal:  No acute deformity  Skin:  Intact, MMM  LABS:  BMET  Recent Labs Lab 06/27/17 2309 06/28/17 0157 06/28/17 0609  NA 134* 137 136  K 3.1* 3.4* 3.3*  CL 110 110 110  CO2 16* 19* 21*  BUN 19 19 19   CREATININE 1.09* 1.03* 1.10*  GLUCOSE 176* 165* 149*   Electrolytes  Recent Labs Lab 06/26/17 2047 06/27/17 0429  06/27/17 2309 06/28/17 0157 06/28/17 0609  CALCIUM 8.0* 7.8*  7.8*  < > 8.1* 8.2* 8.2*  MG 1.8 1.8  --   --  1.7  --   PHOS  --  3.3  --   --  2.0*  --   < > = values in this interval not displayed.  CBC  Recent Labs Lab 06/26/17 1533  06/27/17 0429 06/27/17 0615 06/28/17 0609  WBC 8.5  --  7.2  --  17.5*  HGB 11.2*  < > 10.9* 12.2 11.2*  HCT  35.4*  < > 35.4* 36.0 34.5*  PLT 259  --  180  --  226  < > = values in this interval not displayed.  Coag's  Recent Labs Lab 06/26/17 2047 06/27/17 0429  APTT 31 30  INR 1.11 1.15   Sepsis Markers  Recent Labs Lab 06/26/17 1541 06/26/17 2047 06/27/17 0429 06/28/17 0157  LATICACIDVEN 6.91* 3.0*  --   --   PROCALCITON  --  0.19 0.32 0.33   ABG  Recent Labs Lab 06/27/17 0611 06/27/17 0806 06/28/17 0346  PHART 7.249* 7.265* 7.482*  PCO2ART 38.3 36.5 26.9*  PO2ART 211.0* 145.0* 120*   Liver Enzymes  Recent Labs Lab 06/26/17 1533  AST 168*  ALT 108*  ALKPHOS 71  BILITOT 0.7  ALBUMIN 3.2*   Cardiac Enzymes  Recent Labs Lab 06/26/17 2047 06/27/17 0429 06/27/17 0840  TROPONINI 0.09* 0.05* 0.04*   Glucose  Recent  Labs Lab 06/27/17 1801 06/27/17 1855 06/27/17 2038 06/27/17 2352 06/28/17 0440 06/28/17 0744  GLUCAP 159* 172* 152* 174* 147* 114*   Imaging Dg Chest Port 1 View  Result Date: 06/28/2017 CLINICAL DATA:  Intubation . EXAM: PORTABLE CHEST 1 VIEW COMPARISON:  06/27/2017 .  06/26/2017 . FINDINGS: Endotracheal tube, left IJ line, NG tube in stable position. Cardiomegaly with bilateral pulmonary infiltrates and or edema. No change from prior exam. Low lung volumes. Small bilateral pleural effusions cannot be excluded. No pneumothorax. IMPRESSION: 1. Lines and tubes in stable position. 2. Cardiomegaly with bilateral pulmonary infiltrates/edema, no interim change from prior exam. Small bilateral pleural effusions cannot be excluded . Electronically Signed   By: Marcello Moores  Register   On: 06/28/2017 07:33   STUDIES:  11/2016 TTE >> LVEF 55-60%, G1DD,  Mild AR, mild MR, mildly dilated LA, PAP 47 mm Hg EKG 7/4 >> ST 117, diffuse STD CXR 7/4 >> ETT at clavicular heads, OGT in stomach, chronic cardiomegaly, low volume chest with perihilar atelectasis and vascular congestion  TTE>>>Increased wall thickening, EF 40-45% with some valvular concerns as above EEG>>>diffuse slowness, no seizure activity, non-specific  CULTURES: 7/4 BCx2 >> 7/4 Brenda >>  ANTIBIOTICS: Vanc 7/5>>> Zosyn 7/5>>>  SIGNIFICANT EVENTS: 7/4 Admit Vfib arrest  LINES/TUBES: PIV  x2  Left tibial IO out 7/4 ETT >> 7/4 OGT >> 7/4 foley >> 7/4 L IJ TLC>>> 7/4 R femoral a-line>>>  DISCUSSION: 08MVH with diastolic heart failure, obesity, HTN, Asthma, Sarcoidosis, with witnessed VF arrest.  5-10 mins of CPR, 1 epi, and 1 defib given prior to ROSC.  Patient unresponsive following and therefore TTM started. Unknown of any patient complaints prior to arrest.   ASSESSMENT / PLAN:  PULMONARY A: Acute hypoxic respiratory failure - Pulmonary edeme Acute Hypercapneic respiratory failure Aspiration pneumonia Asthma exacerbation? -  reportedly was asymptomatic prior to the arrest, so do not feel this arrest was caused by an asthma exacerbation. However, wheezing on presentation. P: - Full vent support given hypothermia protocol - Decrease RR to 18, PEEP to 5 and FiO2 to 40% - ABG in AM - Trend CXR in AM - VAP bundle - Continue Solumedrol, Duonebs, Pulmicort, Brovana.  - NPO  CARDIOVASCULAR A: Vfib Cardiac Arrest:estimated  5-10 min of CPR prior to ROSC; not at neurologic baseline so started therapeutic hypothermia (with ice bags at outside hospital, T:35.7 on arrival). Now has arctic sun pads in place to continue cooling. Unclear if this arrest was due to coronary ischemia vs a primary arrhythmia; unclear the history of her sarcoidosis and if she could've  had any cardiac involvement which would predispose to arrhythmia.  Acute CHF with history of diastolic HF (76/16 EF 07-37%) HTN HLD  ? Cardiac sarcoidosis  P: - Telemetry monitoring - Cardiology following - At some point will need cardiac MRI - Trend troponin and EKG per cards - TTE noted as above - Levophed for BP support - Trend lactate - Goal MAP > 65  RENAL A: AKI-on-CKD: Hypokalemia NAG acidosis P: - Foley catheter in place; monitor UOP closely.  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  - Unclear if malignant vs benign at this point  FAMILY  - Updates: No family bedside 7/6 - Inter-disciplinary family meet or Palliative Care meeting due by:  07/03/17  The patient is critically ill with multiple organ systems failure and requires high complexity decision making for assessment and support, frequent evaluation and titration of therapies, application of advanced monitoring technologies and extensive interpretation of multiple databases.   Critical Care Time devoted to patient care services described in this note is  35  Minutes. This time reflects time of care of this signee Dr Jennet Maduro. This critical care time does not reflect procedure time, or teaching time or supervisory time of PA/NP/Med student/Med Resident etc but could involve care discussion time.  Rush Farmer, M.D. [facility] Pulmonary/Critical Care Medicine. Pager: [phone]. After hours pager: [phone].

## 2017-06-28 NOTE — Progress Notes (Signed)
eLink Physician-Brief Progress Note Patient Name: Kari [name] DOB: 1961-11-07 MRN: [id number]   Date of Service  06/28/2017  HPI/Events of Note  Phos supp  eICU Interventions       Intervention Category Intermediate Interventions: Electrolyte abnormality - evaluation and management  Raylene Miyamoto. 06/28/2017, 4:07 AM

## 2017-06-28 NOTE — Progress Notes (Signed)
Progress Note  Patient Name: Kari [name] Date of Encounter: 06/28/2017  Primary Cardiologist: Dr Harrington Challenger  Subjective   Pt remains intubated and sedated  Inpatient Medications    Scheduled Meds: . arformoterol  15 mcg Nebulization BID  . artificial tears  1 application Both Eyes P6P  . aspirin  300 mg Rectal Daily  . budesonide (PULMICORT) nebulizer solution  0.5 mg Nebulization BID  . chlorhexidine gluconate (MEDLINE KIT)  15 mL Mouth Rinse BID  . Chlorhexidine Gluconate Cloth  6 each Topical Daily  . collagenase   Topical Daily  . heparin  5,000 Units Subcutaneous Q8H  . insulin aspart  2-6 Units Subcutaneous Q4H  . insulin glargine  35 Units Subcutaneous Q24H  . ipratropium-albuterol  3 mL Nebulization Q6H  . mouth rinse  15 mL Mouth Rinse 10 times per day  . methylPREDNISolone (SOLU-MEDROL) injection  60 mg Intravenous Q6H  . midazolam  2 mg Intravenous Once  . sodium chloride flush  10-40 mL Intracatheter Q12H   Continuous Infusions: . sodium chloride 10 mL/hr at 06/28/17 0600  . amiodarone 30 mg/hr (06/28/17 0600)  . cisatracurium (NIMBEX) infusion 1.005 mcg/kg/min (06/28/17 0600)  . famotidine (PEPCID) IV Stopped (06/27/17 2209)  . fentaNYL infusion INTRAVENOUS 200 mcg/hr (06/28/17 0618)  . insulin (NOVOLIN-R) infusion Stopped (06/27/17 2143)  . midazolam (VERSED) infusion 3 mg/hr (06/28/17 0600)  . norepinephrine (LEVOPHED) Adult infusion 4 mcg/min (06/28/17 0600)  . piperacillin-tazobactam (ZOSYN)  IV 3.375 g (06/28/17 0618)  .  sodium bicarbonate (isotonic) infusion in sterile water 50 mL/hr at 06/28/17 0600  . sodium phosphate  Dextrose 5% IVPB 10 mmol (06/28/17 0430)  . vancomycin Stopped (06/27/17 2156)   PRN Meds: [COMPLETED] cisatracurium **AND** cisatracurium (NIMBEX) infusion **AND** cisatracurium, fentaNYL, midazolam, sodium chloride flush   Vital Signs    Vitals:   06/28/17 0355 06/28/17 0400 06/28/17 0500 06/28/17 0600  BP:  100/63  (!) 95/56   Pulse:  63 87 64  Resp:  (!) 24 (!) 24 (!) 24  Temp:  (!) 94.3 F (34.6 C) (!) 94.8 F (34.9 C) (!) 95.2 F (35.1 C)  TempSrc:   Core (Comment) Core (Comment)  SpO2: 100% 100% 100% 100%  Weight:    (!) 144.2 kg (317 lb 13.4 oz)  Height:        Intake/Output Summary (Last 24 hours) at 06/28/17 9509 Last data filed at 06/28/17 0600  Gross per 24 hour  Intake          3286.88 ml  Output              915 ml  Net          2371.88 ml   Filed Weights   06/26/17 2000 06/27/17 0545 06/28/17 0600  Weight: (!) 156.6 kg (345 lb 3.9 oz) (!) 144.3 kg (318 lb 0.9 oz) (!) 144.2 kg (317 lb 13.4 oz)    Telemetry     Sinus with AIVR and PVCs- Personally Reviewed  Physical Exam   GEN: WD, Obese; intubated; sedated Neck: No obvious JVD Cardiac: RRR Respiratory: CTA anteriorly GI: Not distended, soft MS: Lower ext wrapped for chronic ulcers; no edema Neuro: Cannot be assessed; intubated and sedated   Labs    Chemistry Recent Labs Lab 06/26/17 1533  06/27/17 2309 06/28/17 0157 06/28/17 0609  NA 138  < > 134* 137 136  K 3.1*  < > 3.1* 3.4* 3.3*  CL 107  < > 110 110 110  CO2  19*  < > 16* 19* 21*  GLUCOSE 202*  < > 176* 165* 149*  BUN 22*  < > '19 19 19  ' CREATININE 1.39*  < > 1.09* 1.03* 1.10*  CALCIUM 8.4*  < > 8.1* 8.2* 8.2*  PROT 7.9  --   --   --   --   ALBUMIN 3.2*  --   --   --   --   AST 168*  --   --   --   --   ALT 108*  --   --   --   --   ALKPHOS 71  --   --   --   --   BILITOT 0.7  --   --   --   --   GFRNONAA 42*  < > 56* >60 55*  GFRAA 48*  < > >60 >60 >60  ANIONGAP 12  < > '8 8 5  ' < > = values in this interval not displayed.   Hematology Recent Labs Lab 06/26/17 1533  06/27/17 0429 06/27/17 0615 06/28/17 0609  WBC 8.5  --  7.2  --  17.5*  RBC 3.80*  --  3.84*  --  3.92  HGB 11.2*  < > 10.9* 12.2 11.2*  HCT 35.4*  < > 35.4* 36.0 34.5*  MCV 93.2  --  92.2  --  88.0  MCH 29.5  --  28.4  --  28.6  MCHC 31.6  --  30.8  --  32.5  RDW 14.6  --  14.5  --   14.4  PLT 259  --  180  --  226  < > = values in this interval not displayed.  Cardiac Enzymes  Recent Labs Lab 06/26/17 2047 06/27/17 0429 06/27/17 0840  TROPONINI 0.09* 0.05* 0.04*     Recent Labs Lab 06/26/17 1539  TROPIPOC 0.01      Radiology    Ct Head Wo Contrast  Result Date: 06/26/2017 CLINICAL DATA:  Cardiac arrest. EXAM: CT HEAD WITHOUT CONTRAST TECHNIQUE: Contiguous axial images were obtained from the base of the skull through the vertex without intravenous contrast. COMPARISON:  08/03/2006. FINDINGS: Brain: There is no evidence for acute hemorrhage, hydrocephalus, mass lesion, or abnormal extra-axial fluid collection. No definite CT evidence for acute infarction. Vascular: No hyperdense vessel or unexpected calcification. Skull: No evidence for fracture. No worrisome lytic or sclerotic lesion. Sinuses/Orbits: The visualized paranasal sinuses and mastoid air cells are clear. Visualized portions of the globes and intraorbital fat are unremarkable. Other: None. IMPRESSION: Stable.  No acute intracranial abnormality. Electronically Signed   By: Misty Stanley M.D.   On: 06/26/2017 16:36   Dg Chest Port 1 View  Result Date: 06/27/2017 CLINICAL DATA:  Status post cardiac arrest. History of acute on chronic CHF, asthma, hypertension, sarcoidosis, nonsmoker. EXAM: PORTABLE CHEST 1 VIEW COMPARISON:  Portable chest x-ray of June 26, 2017 FINDINGS: The lungs are mildly hypoinflated. Confluent alveolar opacities persist inferiorly in the right upper lobe and throughout much of the left lung. The hemidiaphragms are visible. There is no significant pleural effusion. No pneumothorax is observed. The cardiac silhouette is enlarged. The pulmonary vascularity is prominent centrally. The interstitial markings are less conspicuous today. The endotracheal tube tip lies 3.6 cm above the carina. The esophagogastric tube tip in proximal port project below the inferior margin of the image. The left  internal jugular venous catheter tip projects over the cavoatrial junction. External pacemaker defibrillator pads are present. IMPRESSION: Further mild improvement  in CHF and pulmonary interstitial and alveolar edema. The support tubes are in reasonable position. Electronically Signed   By: David  Martinique M.D.   On: 06/27/2017 07:17   Dg Chest Port 1 View  Result Date: 06/26/2017 CLINICAL DATA:  Central line placement EXAM: PORTABLE CHEST 1 VIEW COMPARISON:  Chest radiograph 06/26/2017 at 8:39 p.m. FINDINGS: Left internal jugular vein approach central venous catheter tip is at the cavoatrial junction. Endotracheal tube tip is at the level of the clavicular heads. Enteric tube courses beyond the field of view. Cardiomegaly is unchanged. Pulmonary edema is slightly improved but remains moderate. No sizable pleural effusion. IMPRESSION: 1. Left IJ CVC tip at the cavoatrial junction. 2. Cardiomegaly and slightly improved, moderate pulmonary edema. Electronically Signed   By: Ulyses Jarred M.D.   On: 06/26/2017 22:32   Dg Chest Port 1 View  Result Date: 06/26/2017 CLINICAL DATA:  Acute respiratory failure with hypoxia. EXAM: PORTABLE CHEST 1 VIEW COMPARISON:  Radiograph earlier this day at 1512 hour FINDINGS: Endotracheal tube 2.9 cm from the carina. Enteric tube in place. Again seen cardiomegaly. Worsening vascular congestion with development perihilar edema. Possible pleural effusions with blunting of costophrenic angle. No pneumothorax. IMPRESSION: 1. Worsening vascular congestion with development of perihilar pulmonary edema. 2. Stable cardiomegaly. Electronically Signed   By: Jeb Levering M.D.   On: 06/26/2017 21:26   Dg Chest Portable 1 View  Result Date: 06/26/2017 CLINICAL DATA:  Intubation. EXAM: PORTABLE CHEST 1 VIEW COMPARISON:  04/01/2017 FINDINGS: New endotracheal tube with tip at the lower margin of the clavicular heads. An orogastric tube reaches the stomach. Cardiomegaly that is chronic and  similar to prior when allowing for differences in technique. Low lung volumes with perihilar atelectasis and vascular congestion. No edema, effusion, or visible pneumothorax. IMPRESSION: 1. New endotracheal and orogastric tubes in good position. 2. Low volume chest with perihilar atelectasis and vascular congestion. 3. Cardiomegaly. Electronically Signed   By: Monte Fantasia M.D.   On: 06/26/2017 15:25    Patient Profile     56 y.o. female with past medical history of sarcoid, hypertension, hyperlipidemia, diastolic congestive heart failure, asthma admitted following cardiac arrest. Patient received 5-10 minutes of CPR following ventricular fibrillation arrest. She required one defibrillation to sinus rhythm. Echocardiogram shows ejection fraction 40-45%, moderate LVH, moderate LVE, mild aortic insufficiency, moderate mitral regurgitation and moderate left atrial enlargement.  Assessment & Plan    1 status post cardiac arrest-Most pressing issue at present is possible anoxic encephalopathy/neurological recovery. If she recovers she will need further cardiac workup including cardiac catheterization and given history of sarcoid, cardiac MRI. Cardiac enzymes not c/w ACS. Continue aspirin and DVT prophylaxis subcutaneous heparin. Blood pressure will not allow additional cardiac medications. Echo shows EF 40-45; will add beta blocker and ARB later as pulse and BP allow.  2 paroxysmal atrial fibrillation-previously noted on telemetry. Continue IV amiodarone. If she improves and once all procedures complete she would require long-term anticoagulation (CHADSvasc 3).  3 VDRF-per CCM. Wean pressors as tolerated.  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 Follow neurological status closely. If no improvement she may require EEG and neurology eval.  5 possible aspiration-continue antibiotics.  6 large cystic ovarian mass  7 Hypokalemia-supplement  Signed, Kirk Ruths, MD    06/28/2017, 7:12 AM

## 2017-06-29 ENCOUNTER — Inpatient Hospital Stay (HOSPITAL_COMMUNITY): Payer: Medicare Other

## 2017-06-29 LAB — BLOOD GAS, ARTERIAL
Acid-base deficit: 2.7 mmol/L — ABNORMAL HIGH (ref 0.0–2.0)
Bicarbonate: 21.2 mmol/L (ref 20.0–28.0)
Drawn by: 252031
FIO2: 30
MECHVT: 540 mL
O2 Saturation: 97.9 %
PEEP: 5 cmH2O
Patient temperature: 98.6
RATE: 18 resp/min
pCO2 arterial: 33.9 mmHg (ref 32.0–48.0)
pH, Arterial: 7.412 (ref 7.350–7.450)
pO2, Arterial: 105 mmHg (ref 83.0–108.0)

## 2017-06-29 LAB — CBC
HCT: 37.3 % (ref 36.0–46.0)
Hemoglobin: 11.6 g/dL — ABNORMAL LOW (ref 12.0–15.0)
MCH: 28.2 pg (ref 26.0–34.0)
MCHC: 31.1 g/dL (ref 30.0–36.0)
MCV: 90.8 fL (ref 78.0–100.0)
Platelets: 219 10*3/uL (ref 150–400)
RBC: 4.11 MIL/uL (ref 3.87–5.11)
RDW: 15.2 % (ref 11.5–15.5)
WBC: 13.3 10*3/uL — ABNORMAL HIGH (ref 4.0–10.5)

## 2017-06-29 LAB — BASIC METABOLIC PANEL
Anion gap: 8 (ref 5–15)
Anion gap: 8 (ref 5–15)
BUN: 25 mg/dL — ABNORMAL HIGH (ref 6–20)
BUN: 31 mg/dL — ABNORMAL HIGH (ref 6–20)
CO2: 21 mmol/L — ABNORMAL LOW (ref 22–32)
CO2: 21 mmol/L — ABNORMAL LOW (ref 22–32)
Calcium: 8.5 mg/dL — ABNORMAL LOW (ref 8.9–10.3)
Calcium: 8.5 mg/dL — ABNORMAL LOW (ref 8.9–10.3)
Chloride: 108 mmol/L (ref 101–111)
Chloride: 108 mmol/L (ref 101–111)
Creatinine, Ser: 1.46 mg/dL — ABNORMAL HIGH (ref 0.44–1.00)
Creatinine, Ser: 1.58 mg/dL — ABNORMAL HIGH (ref 0.44–1.00)
GFR calc Af Amer: 46 mL/min — ABNORMAL LOW (ref >60)
GFR calc Af Amer: 41 mL/min — ABNORMAL LOW (ref >60)
GFR calc non Af Amer: 39 mL/min — ABNORMAL LOW (ref >60)
GFR calc non Af Amer: 36 mL/min — ABNORMAL LOW (ref >60)
Glucose, Bld: 151 mg/dL — ABNORMAL HIGH (ref 65–99)
Glucose, Bld: 153 mg/dL — ABNORMAL HIGH (ref 65–99)
Potassium: 4.2 mmol/L (ref 3.5–5.1)
Potassium: 3.8 mmol/L (ref 3.5–5.1)
Sodium: 137 mmol/L (ref 135–145)
Sodium: 137 mmol/L (ref 135–145)

## 2017-06-29 LAB — CULTURE, RESPIRATORY W GRAM STAIN

## 2017-06-29 LAB — GLUCOSE, CAPILLARY
Glucose-Capillary: 142 mg/dL — ABNORMAL HIGH (ref 65–99)
Glucose-Capillary: 145 mg/dL — ABNORMAL HIGH (ref 65–99)
Glucose-Capillary: 136 mg/dL — ABNORMAL HIGH (ref 65–99)
Glucose-Capillary: 145 mg/dL — ABNORMAL HIGH (ref 65–99)
Glucose-Capillary: 140 mg/dL — ABNORMAL HIGH (ref 65–99)
Glucose-Capillary: 145 mg/dL — ABNORMAL HIGH (ref 65–99)

## 2017-06-29 LAB — MAGNESIUM
Magnesium: 2 mg/dL (ref 1.7–2.4)
Magnesium: 2.2 mg/dL (ref 1.7–2.4)

## 2017-06-29 LAB — PHOSPHORUS
Phosphorus: 3.7 mg/dL (ref 2.5–4.6)
Phosphorus: 3.8 mg/dL (ref 2.5–4.6)

## 2017-06-29 LAB — CULTURE, RESPIRATORY: Culture: NORMAL

## 2017-06-29 LAB — VANCOMYCIN, TROUGH: Vancomycin Tr: 36 ug/mL (ref 15–20)

## 2017-06-29 MED ORDER — VITAL HIGH PROTEIN PO LIQD: 1000.0000 mL | ORAL | Status: DC

## 2017-06-29 MED ORDER — VITAL HIGH PROTEIN PO LIQD
1000.0000 mL | ORAL | Status: DC
  Administered 2017-06-29: 1000 mL
  Administered 2017-06-30 (×5)
  Administered 2017-06-30: 1000 mL
  Administered 2017-07-01 (×2)

## 2017-06-29 MED ORDER — PRO-STAT SUGAR FREE PO LIQD
30.0000 mL | Freq: Every day | ORAL | Status: DC
  Administered 2017-06-29 – 2017-07-01 (×3): 30 mL
  Filled 2017-06-29 (×3): qty 30

## 2017-06-29 MED ORDER — HYDRALAZINE HCL 20 MG/ML IJ SOLN
10.0000 mg | Freq: Four times a day (QID) | INTRAMUSCULAR | Status: DC | PRN
  Administered 2017-06-29: 10 mg via INTRAVENOUS
  Filled 2017-06-29: qty 1

## 2017-06-29 MED ORDER — METHYLPREDNISOLONE SODIUM SUCC 125 MG IJ SOLR
60.0000 mg | Freq: Two times a day (BID) | INTRAMUSCULAR | Status: DC
  Administered 2017-06-29 – 2017-06-30 (×2): 60 mg via INTRAVENOUS
  Filled 2017-06-29 (×2): qty 2

## 2017-06-29 MED ORDER — FUROSEMIDE 10 MG/ML IJ SOLN
60.0000 mg | Freq: Four times a day (QID) | INTRAMUSCULAR | Status: AC
  Administered 2017-06-29 (×2): 60 mg via INTRAVENOUS
  Filled 2017-06-29 (×2): qty 6

## 2017-06-29 MED ORDER — VANCOMYCIN HCL 10 G IV SOLR: 1500.0000 mg | INTRAVENOUS | Status: DC

## 2017-06-29 MED ORDER — VITAL HIGH PROTEIN PO LIQD
1000.0000 mL | ORAL | Status: DC
  Filled 2017-06-29: qty 1000

## 2017-06-29 MED ORDER — POTASSIUM CHLORIDE 10 MEQ/50ML IV SOLN
10.0000 meq | INTRAVENOUS | Status: AC
  Administered 2017-06-29 – 2017-06-30 (×2): 10 meq via INTRAVENOUS
  Filled 2017-06-29 (×2): qty 50

## 2017-06-29 MED ORDER — PRO-STAT SUGAR FREE PO LIQD: 30.0000 mL | Freq: Two times a day (BID) | ORAL | Status: DC

## 2017-06-29 MED ORDER — PRO-STAT SUGAR FREE PO LIQD: 30.0000 mL | Freq: Every day | ORAL | Status: DC

## 2017-06-29 NOTE — Progress Notes (Signed)
Initial Nutrition Assessment  DOCUMENTATION CODES:  Morbid obesity  INTERVENTION:  Initiate TF via OGT with Vital High Protein at goal rate of 50 ml/h (1200 ml per day) and Prostat 30 ml q24 hrs to provide 1300 kcals, 121 gm protein, 1008 ml free water daily.   NUTRITION DIAGNOSIS:  Inadequate oral intake related to inability to eat as evidenced by NPO status.  GOAL:  Provide needs based on ASPEN/SCCM guidelines  MONITOR:  Diet advancement, Vent status, Labs, Weight trends, I & O's, TF tolerance  REASON FOR ASSESSMENT:  Consult Enteral/tube feeding initiation and management  ASSESSMENT:  56 yo female admitted with witness VF arrest, acute respiratory failure with possible aspiration pneumonia requiring intubation, started on TTM. Pt with AKI on CKD (baseline 1.15-1.25).  Pt with hx of asthma, heart failure (EF 55-60% 11/2016), HTN, questionable sarcoidosis, HL, chronic BLE ulcers, very large cystic ovarian mass, depression  Pt intubated, sedated w/ fentanyl and precedex.   Patient is currently intubated on ventilator support MV:9.5 L/min Temp (24hrs), Avg:98.5 F (36.9 C), Min:98.1 F (36.7 C), Max:98.8 F (37.1 C)  Propofol: None  Meds: Fentanyl, precedex, IV abx, Lasix, insulin, methylprednisolone, IVF, IV H2RA Labs: BGs 125-165, BUN/CReat up slightly 1.46/25, Phos mag wdl, wbc:13.3   Recent Labs Lab 06/28/17 0157  06/28/17 0944 06/28/17 1319 06/29/17 0345 06/29/17 1345  NA 137  < > 136 138 137  --   K 3.4*  < > 3.8 4.1 4.2  --   CL 110  < > 109 110 108  --   CO2 19*  < > 19* 21* 21*  --   BUN 19  < > 19 20 25*  --   CREATININE 1.03*  < > 1.18* 1.29* 1.46*  --   CALCIUM 8.2*  < > 8.0* 8.3* 8.5*  --   MG 1.7  --   --   --  2.0 2.2  PHOS 2.0*  --   --   --  3.7 3.8  GLUCOSE 165*  < > 127* 130* 151*  --   < > = values in this interval not displayed.  Diet Order:     Skin:  Chronic venous stasis leg ulcers  Last BM:  Unknown  Height:  Ht Readings from  Last 1 Encounters:  06/26/17 5\' 5"  (1.651 m)   Weight:  Wt Readings from Last 1 Encounters:  06/29/17 (!) 318 lb 4.5 oz (144.4 kg)   Wt Readings from Last 10 Encounters:  06/29/17 (!) 318 lb 4.5 oz (144.4 kg)  04/05/17 294 lb 15.6 oz (133.8 kg)  12/19/16 295 lb 4.8 oz (133.9 kg)  01/11/16 300 lb (136.1 kg)  10/21/15 300 lb (136.1 kg)  09/09/15 287 lb (130.2 kg)  08/22/15 (!) 320 lb (145.2 kg)  08/03/15 (!) 330 lb (149.7 kg)  03/11/15 300 lb (136.1 kg)  06/03/14 (!) 319 lb (144.7 kg)  Dosing weight: 317 lbs 13 oz   Ideal Body Weight:  56.8 kg  BMI:  Body mass index is 52.96 kg/m.  Estimated Nutritional Needs:  Kcal:  1250-1420 kcals (22-25 kcal/kg bw) Protein:  114-142 g Pro (2-2.5 g/kg ibw) Fluid:  Per md  EDUCATION NEEDS:  No education needs identified at this time  Burtis Junes RD, LDN, [facility] Pager: [phone] 06/29/2017 3:32 PM

## 2017-06-29 NOTE — Progress Notes (Signed)
Pharmacy Antibiotic Note  Kari [name] is a 56 y.o. female admitted on 06/26/2017 with cardiac arrest.  Pharmacy has been consulted for vancomycin and Zosyn for suspected pneumonia.  Patient completed hypothermia protocol. Afebrile overnight, wbc down to 13 this morning. Scr has been trending up, 1.4 this am  Vancomycin trough this am is above goal at 36, dose was in process, instructed nurse to stop now. Uop trending down with 959ml overnight.   Plan: Reduce Vancomycin to 1500mg  IV q48 hours.  Goal trough 15-20 mcg/mL. Will schedule next dose for tomorrow night, if scr worsens overnight will consider rechecking random level before redosing. Zosyn 3.375g IV q8h (4 hour infusion).  Height: 5\' 5"  (165.1 cm) Weight: (!) 318 lb 4.5 oz (144.4 kg) IBW/kg (Calculated) : 57  Temp (24hrs), Avg:98.3 F (36.8 C), Min:97.3 F (36.3 C), Max:98.8 F (37.1 C)   Recent Labs Lab 06/26/17 1533 06/26/17 1541  06/26/17 2047  06/27/17 0429  06/28/17 0157 06/28/17 0609 06/28/17 0944 06/28/17 1319 06/29/17 0345 06/29/17 0850  WBC 8.5  --   --   --   --  7.2  --   --  17.5*  --   --  13.3*  --   CREATININE 1.39*  --   < > 1.31*  < > 1.21*  1.18*  < > 1.03* 1.10* 1.18* 1.29* 1.46*  --   LATICACIDVEN  --  6.91*  --  3.0*  --   --   --   --   --   --   --   --   --   VANCOTROUGH  --   --   --   --   --   --   --   --   --   --   --   --  36*  < > = values in this interval not displayed.  Estimated Creatinine Clearance: 63.2 mL/min (A) (by C-G formula based on SCr of 1.46 mg/dL (H)).    No Known Allergies  Antimicrobials this admission: 7/4 vancomcyin>> 7/4 zosyn>>  7/4 bld x2: ngtd 7/4 resp cx: rare gpc/gnr>>reincubated  Thank you for allowing pharmacy to be a part of this patient's care.  Erin Hearing PharmD., BCPS Clinical Pharmacist Pager [phone] 06/29/2017 9:53 AM

## 2017-06-29 NOTE — Progress Notes (Signed)
CRITICAL VALUE ALERT  Critical Value: vanc trough 30.6   Date & Time Notied: 06/29/2017 9:51 AM   Provider Notified: Erin Hearing, Pharmacist  Orders Received/Actions taken vanc d/c per pharmacy. Dose administered at     0850 stopped

## 2017-06-29 NOTE — Progress Notes (Signed)
Progress Note  Patient Name: Kari [name] Date of Encounter: 06/29/2017  Primary Cardiologist:  Dr. Harrington Challenger  Subjective   She is intubated and awake and answering questions.  She denies chest pain or SOB.   Inpatient Medications    Scheduled Meds: . arformoterol  15 mcg Nebulization BID  . aspirin  300 mg Rectal Daily  . budesonide (PULMICORT) nebulizer solution  0.5 mg Nebulization BID  . chlorhexidine gluconate (MEDLINE KIT)  15 mL Mouth Rinse BID  . Chlorhexidine Gluconate Cloth  6 each Topical Daily  . collagenase   Topical Daily  . heparin  5,000 Units Subcutaneous Q8H  . insulin aspart  2-6 Units Subcutaneous Q4H  . insulin glargine  35 Units Subcutaneous Q24H  . ipratropium-albuterol  3 mL Nebulization Q6H  . mouth rinse  15 mL Mouth Rinse 10 times per day  . methylPREDNISolone (SOLU-MEDROL) injection  60 mg Intravenous Q6H  . midazolam  2 mg Intravenous Once  . sodium chloride flush  10-40 mL Intracatheter Q12H   Continuous Infusions: . sodium chloride 10 mL/hr at 06/29/17 0800  . amiodarone 30 mg/hr (06/29/17 0800)  . cisatracurium (NIMBEX) infusion Stopped (06/28/17 0900)  . dexmedetomidine (PRECEDEX) IV infusion 0.4 mcg/kg/hr (06/29/17 0800)  . famotidine (PEPCID) IV Stopped (06/28/17 2230)  . fentaNYL infusion INTRAVENOUS 75 mcg/hr (06/29/17 0800)  . insulin (NOVOLIN-R) infusion Stopped (06/27/17 2143)  . midazolam (VERSED) infusion Stopped (06/28/17 1300)  . norepinephrine (LEVOPHED) Adult infusion Stopped (06/28/17 1600)  . piperacillin-tazobactam (ZOSYN)  IV 3.375 g (06/29/17 0552)  . vancomycin 1,250 mg (06/29/17 0826)   PRN Meds: [COMPLETED] cisatracurium **AND** cisatracurium (NIMBEX) infusion **AND** cisatracurium, fentaNYL, midazolam, sodium chloride flush   Vital Signs    Vitals:   06/29/17 0700 06/29/17 0800 06/29/17 0807 06/29/17 0811  BP:  136/84  115/89  Pulse: (!) 59 (!) 59  72  Resp: _0 Temp:      TempSrc:      SpO2: 99% 99%  96% 95%  Weight:      Height:        Intake/Output Summary (Last 24 hours) at 06/29/17 0902 Last data filed at 06/29/17 0826  Gross per 24 hour  Intake          2335.34 ml  Output              900 ml  Net          1435.34 ml   Filed Weights   06/27/17 0545 06/28/17 0600 06/29/17 0600  Weight: (!) 318 lb 0.9 oz (144.3 kg) (!) 317 lb 13.4 oz (144.2 kg) (!) 318 lb 4.5 oz (144.4 kg)    Telemetry    NSR - Personally Reviewed  ECG    NA - Personally Reviewed  Physical Exam   GEN: No acute distress.  Intubated Neck: No  JVD Cardiac: RRR, no murmurs, rubs, or gallops.  Respiratory: Clear  to auscultation bilaterally. GI: Soft, nontender, non-distended  MS: No  edema; No deformity. Neuro:  Nonfocal .  Moves all extremities.  Psych: Unable to assess intubated.   Labs    Chemistry Recent Labs Lab 06/26/17 1533  06/28/17 0944 06/28/17 1319 06/29/17 0345  NA 138  < > 136 138 137  K 3.1*  < > 3.8 4.1 4.2  CL 107  < > 109 110 108  CO2 19*  < > 19* 21* 21*  GLUCOSE 202*  < > 127* 130* 151*  BUN 22*  < >  19 20 25*  CREATININE 1.39*  < > 1.18* 1.29* 1.46*  CALCIUM 8.4*  < > 8.0* 8.3* 8.5*  PROT 7.9  --   --   --   --   ALBUMIN 3.2*  --   --   --   --   AST 168*  --   --   --   --   ALT 108*  --   --   --   --   ALKPHOS 71  --   --   --   --   BILITOT 0.7  --   --   --   --   GFRNONAA 42*  < > 51* 46* 39*  GFRAA 48*  < > 59* 53* 46*  ANIONGAP 12  < > _0 < > = values in this interval not displayed.   Hematology Recent Labs Lab 06/27/17 0429 06/27/17 0615 06/28/17 0609 06/29/17 0345  WBC 7.2  --  17.5* 13.3*  RBC 3.84*  --  3.92 4.11  HGB 10.9* 12.2 11.2* 11.6*  HCT 35.4* 36.0 34.5* 37.3  MCV 92.2  --  88.0 90.8  MCH 28.4  --  28.6 28.2  MCHC 30.8  --  32.5 31.1  RDW 14.5  --  14.4 15.2  PLT 180  --  226 219    Cardiac Enzymes Recent Labs Lab 06/26/17 2047 06/27/17 0429 06/27/17 0840  TROPONINI 0.09* 0.05* 0.04*    Recent Labs Lab  06/26/17 1539  TROPIPOC 0.01     BNPNo results for input(s): BNP, PROBNP in the last 168 hours.   DDimer No results for input(s): DDIMER in the last 168 hours.   Radiology    Dg Chest Port 1 View  Result Date: 06/29/2017 CLINICAL DATA:  Patient history of ET tube. EXAM: PORTABLE CHEST 1 VIEW COMPARISON:  Chest radiograph 06/28/2017. FINDINGS: ET tube terminates in the mid trachea. Enteric tube courses inferior to the diaphragm. Left IJ approach central venous catheter tip projects over the superior vena cava. Monitoring leads overlie the patient. Pacer pads overlie the patient. Stable cardiomegaly. Low lung volumes. Perihilar interstitial opacities, stable to mildly improved. Basilar heterogeneous opacities. No pleural effusion or pneumothorax. IMPRESSION: 1. Stable support apparatus. 2. Low lung volumes with mild improved interstitial opacities, potentially representing atelectasis and basilar atelectasis. 3. Possible small pleural effusions. Electronically Signed   By: Lovey Newcomer M.D.   On: 06/29/2017 07:22   Dg Chest Port 1 View  Result Date: 06/28/2017 CLINICAL DATA:  Intubation . EXAM: PORTABLE CHEST 1 VIEW COMPARISON:  06/27/2017 .  06/26/2017 . FINDINGS: Endotracheal tube, left IJ line, NG tube in stable position. Cardiomegaly with bilateral pulmonary infiltrates and or edema. No change from prior exam. Low lung volumes. Small bilateral pleural effusions cannot be excluded. No pneumothorax. IMPRESSION: 1. Lines and tubes in stable position. 2. Cardiomegaly with bilateral pulmonary infiltrates/edema, no interim change from prior exam. Small bilateral pleural effusions cannot be excluded . Electronically Signed   By: Marcello Moores  Register   On: 06/28/2017 07:33    Cardiac Studies   ECHO:    - Left ventricle: Diffuse hypokinesis worse in the inferior basal   wall The cavity size was moderately dilated. Wall thickness was   increased in a pattern of moderate LVH. Systolic function was    mildly to moderately reduced. The estimated ejection fraction was   in the range of 40% to 45%. Left ventricular diastolic function   parameters were normal. -  Aortic valve: There was mild regurgitation. - Mitral valve: There was moderate regurgitation. - Left atrium: The atrium was moderately dilated. - Atrial septum: No defect or patent foramen ovale was identified.  Patient Profile     56 y.o. female with past medical history of sarcoid, hypertension, hyperlipidemia, diastolic congestive heart failure, asthma admitted following cardiac arrest. Patient received 5-10 minutes of CPR following ventricular fibrillation arrest. She required one defibrillation to sinus rhythm. Echocardiogram shows ejection fraction 40-45%, moderate LVH, moderate LVE, mild aortic insufficiency, moderate mitral regurgitation and moderate left atrial enlargement.  Assessment & Plan    CARDIAC ARREST:    She is awake and alert.  She will need a cardiac work up with cath once she is off the vent and pending her creat.   EF is slightly low but hold off on ACE or ARB given mild increase in creat.  Possibly start beta blocker in the AM.  HR has been labile.   PAF:  On IV amio.   NSR.  Continue amiodarone until taking POs.    Signed, Minus Breeding, MD  06/29/2017, 9:02 AM

## 2017-06-29 NOTE — Progress Notes (Signed)
[facility] Progress Note Patient Name: Kari [name] DOB: February 03, 1961 MRN: [id number]   Date of Service  06/29/2017  HPI/Events of Note  K+ = 3.8 and Creatinine = 1.58. Patient being actively diuresed.   eICU Interventions  Will replace K+.      Intervention Category Major Interventions: Electrolyte abnormality - evaluation and management  Areonna Bran Eugene 06/29/2017, 10:04 PM

## 2017-06-29 NOTE — Progress Notes (Signed)
PULMONARY / CRITICAL CARE MEDICINE   Name: Kari [name] MRN: [id number] DOB: Mar 10, 1961    ADMISSION DATE:  06/26/2017 CONSULTATION DATE:  06/26/17  REFERRING MD:  Dr. Eulis Foster  CHIEF COMPLAINT:  Cardiac Arrest  BRIEF SUMMARY: 56 year old female admitted after witnessed cardiac arrest.  PMH significant for asthma, heart failure (11/2016 EF 55-60%) , HTN, Questionable sarcoidosis (based on mediastinal lymphadenopathy no biopsy), HLD, obesity, chronic BLE ulcers, large cystic ovarian mass, headache and depression.  CT head negative.  EEG with diffuse slowing.    SUBJECTIVE:  RN reports pt will wake and follow commands on [facility].  BP trending up.     VITAL SIGNS: BP (!) 140/92   Pulse (!) 57   Temp 98.1 F (36.7 C) (Core (Comment))   Resp (!) 0   Ht 5\' 5"  (1.651 m)   Wt (!) 318 lb 4.5 oz (144.4 kg)   LMP 11/21/2011   SpO2 99%   BMI 52.96 kg/m   HEMODYNAMICS: CVP:  [7 mmHg-27 mmHg] 12 mmHg  VENTILATOR SETTINGS: Vent Mode: PRVC FiO2 (%):  [30 %-40 %] 30 % Set Rate:  [18 bmp-24 bmp] 18 bmp Vt Set:  [540 mL] 540 mL PEEP:  [5 cmH20] 5 cmH20 Plateau Pressure:  [23 ZDG38-75 cmH20] 24 cmH20  INTAKE / OUTPUT: I/O last 3 completed shifts: In: 3581.5 [I.V.:3078.2; IV Piggyback:503.3] Out: 1410 [Urine:1410]  PHYSICAL EXAMINATION:  General: morbidly obese female in NAD  HEENT: MM pink/moist, ETT Neuro: sedate CV: s1s2 rrr, no m/r/g PULM: even/non-labored, lungs bilaterally diminished but clear, difficult to assess lower due cooling pads  IE:PPIR, non-tender, bsx4 active  Extremities: warm/dry, trace edema  Skin: no rashes or lesions   LABS:  BMET  Recent Labs Lab 06/28/17 0944 06/28/17 1319 06/29/17 0345  NA 136 138 137  K 3.8 4.1 4.2  CL 109 110 108  CO2 19* 21* 21*  BUN 19 20 25*  CREATININE 1.18* 1.29* 1.46*  GLUCOSE 127* 130* 151*   Electrolytes  Recent Labs Lab 06/27/17 0429  06/28/17 0157  06/28/17 0944 06/28/17 1319 06/29/17 0345  CALCIUM 7.8*   7.8*  < > 8.2*  < > 8.0* 8.3* 8.5*  MG 1.8  --  1.7  --   --   --  2.0  PHOS 3.3  --  2.0*  --   --   --  3.7  < > = values in this interval not displayed.  CBC  Recent Labs Lab 06/27/17 0429 06/27/17 0615 06/28/17 0609 06/29/17 0345  WBC 7.2  --  17.5* 13.3*  HGB 10.9* 12.2 11.2* 11.6*  HCT 35.4* 36.0 34.5* 37.3  PLT 180  --  226 219    Coag's  Recent Labs Lab 06/26/17 2047 06/27/17 0429  APTT 31 30  INR 1.11 1.15   Sepsis Markers  Recent Labs Lab 06/26/17 1541 06/26/17 2047 06/27/17 0429 06/28/17 0157  LATICACIDVEN 6.91* 3.0*  --   --   PROCALCITON  --  0.19 0.32 0.33   ABG  Recent Labs Lab 06/27/17 0806 06/28/17 0346 06/29/17 0340  PHART 7.265* 7.482* 7.412  PCO2ART 36.5 26.9* 33.9  PO2ART 145.0* 120* 105   Liver Enzymes  Recent Labs Lab 06/26/17 1533  AST 168*  ALT 108*  ALKPHOS 71  BILITOT 0.7  ALBUMIN 3.2*   Cardiac Enzymes  Recent Labs Lab 06/26/17 2047 06/27/17 0429 06/27/17 0840  TROPONINI 0.09* 0.05* 0.04*   Glucose  Recent Labs Lab 06/28/17 0744 06/28/17 1202 06/28/17 1612  06/28/17 2014 06/28/17 2346 06/29/17 0402  GLUCAP 114* 139* 146* 142* 143* 142*   Imaging No results found.   STUDIES:  11/2016 TTE >> LVEF 55-60%, G1DD,  Mild AR, mild MR, mildly dilated LA, PAP 47 mm Hg EKG 7/4 >> ST 117, diffuse STD CXR 7/4 >> ETT at clavicular heads, OGT in stomach, chronic cardiomegaly, low volume chest with perihilar atelectasis and vascular congestion  TTE 7/5 >> Increased wall thickening, EF 40-45% with some valvular concerns as above EEG 7/5 >> diffuse slowness, no seizure activity, non-specific  CULTURES: BCx2 7/4 >> Sputum 7/5 >>  ANTIBIOTICS: Vanc 7/5 >> 7/7 Zosyn 7/5 >>  SIGNIFICANT EVENTS: 7/4 Admit Vfib arrest  LINES/TUBES: 7/4 ETT >> 7/4 OGT >> 7/4 foley >> 7/4 L IJ TLC >> 7/4 R femoral a-line >> out   DISCUSSION: [id number] with diastolic heart failure, obesity, HTN, Asthma, Sarcoidosis, with  witnessed VF arrest.  5-10 mins of CPR, 1 epi, and 1 defib given prior to ROSC.  Patient unresponsive following and therefore TTM started. Unknown of any patient complaints prior to arrest.   ASSESSMENT / PLAN:  PULMONARY A: Acute Hypoxic Respiratory Failure - in setting of pulmonary edema Acute Hypercapneic respiratory failure Aspiration pneumonia Asthma exacerbation? - reportedly was asymptomatic prior to the arrest, so do not feel this arrest was caused by an asthma exacerbation. However, wheezing on presentation. P: PRVC 8 cc/kg Wean O2 for sats > 92% Trend CXR / ABG Continue solumedrol > reduce to 60 mg IV Q12 Brovana + Pulmicort  Duoneb Q6  CARDIOVASCULAR A: Vfib Cardiac Arrest - estimated  5-10 min of CPR prior to ROSC.  Unclear if this arrest was due to coronary ischemia vs a primary arrhythmia; unclear the history of her sarcoidosis and if she could've had any cardiac involvement which would predispose to arrhythmia.  Acute CHF with history of diastolic HF (10/17 EF 51-02%) HTN HLD  ? 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  May need increase with addition of TF  NEUROLOGIC A: Anoxic metabolic Encephalopathy - head CT neg, UDS neg>  Waking up on exam 7/7. P: RASS  goal: 0 to -1  Follow serial neuro exams Early PT efforts Fentanyl gtt for pain  Continue precedex gtt  GU A: Large cystic ovarian mass - diagnosed 03/2017, surgery postponed due to acute issues with AKI, hypokalemia at the time, unclear if malignant vs benign P: Will need outpatient work up post discharge   FAMILY  - Updates:  Sister updated at bedside 7/7 am on NP rounds  - Inter-disciplinary family meet or Palliative Care meeting due by:  07/03/17  CC Time: 58 minutes   Noe Gens, NP-C [facility] Pgr: [phone] or if no answer [phone] 06/29/2017, 7:11 AM

## 2017-06-29 NOTE — Progress Notes (Signed)
06/29/2017 11:04 AM Noe Gens NP paged and made aware pt. With elevated SBP 170-180 with initiation of ventilator weaning. PT. Calm on ventilator, no tachypnea. Verbal order received ok to give hydralazine 10-20 mg IV Q6h prn for SBP greater than 170 orders placed and enacted. Will continue to closely monitor patient.  Haevyn Ury, Arville Lime

## 2017-06-30 ENCOUNTER — Inpatient Hospital Stay (HOSPITAL_COMMUNITY): Payer: Medicare Other

## 2017-06-30 DIAGNOSIS — I480 Paroxysmal atrial fibrillation: Secondary | ICD-10-CM

## 2017-06-30 LAB — GLUCOSE, CAPILLARY
Glucose-Capillary: 164 mg/dL — ABNORMAL HIGH (ref 65–99)
Glucose-Capillary: 135 mg/dL — ABNORMAL HIGH (ref 65–99)
Glucose-Capillary: 111 mg/dL — ABNORMAL HIGH (ref 65–99)
Glucose-Capillary: 145 mg/dL — ABNORMAL HIGH (ref 65–99)
Glucose-Capillary: 112 mg/dL — ABNORMAL HIGH (ref 65–99)

## 2017-06-30 LAB — CBC
HCT: 37.6 % (ref 36.0–46.0)
Hemoglobin: 12.1 g/dL (ref 12.0–15.0)
MCH: 29.2 pg (ref 26.0–34.0)
MCHC: 32.2 g/dL (ref 30.0–36.0)
MCV: 90.6 fL (ref 78.0–100.0)
Platelets: 215 10*3/uL (ref 150–400)
RBC: 4.15 MIL/uL (ref 3.87–5.11)
RDW: 14.9 % (ref 11.5–15.5)
WBC: 14.5 10*3/uL — ABNORMAL HIGH (ref 4.0–10.5)

## 2017-06-30 LAB — BASIC METABOLIC PANEL
Anion gap: 9 (ref 5–15)
BUN: 38 mg/dL — ABNORMAL HIGH (ref 6–20)
CO2: 23 mmol/L (ref 22–32)
Calcium: 8.6 mg/dL — ABNORMAL LOW (ref 8.9–10.3)
Chloride: 105 mmol/L (ref 101–111)
Creatinine, Ser: 1.62 mg/dL — ABNORMAL HIGH (ref 0.44–1.00)
GFR calc Af Amer: 40 mL/min — ABNORMAL LOW (ref >60)
GFR calc non Af Amer: 35 mL/min — ABNORMAL LOW (ref >60)
Glucose, Bld: 161 mg/dL — ABNORMAL HIGH (ref 65–99)
Potassium: 3.8 mmol/L (ref 3.5–5.1)
Sodium: 137 mmol/L (ref 135–145)

## 2017-06-30 LAB — MAGNESIUM
Magnesium: 2 mg/dL (ref 1.7–2.4)
Magnesium: 2.2 mg/dL (ref 1.7–2.4)

## 2017-06-30 LAB — PHOSPHORUS
Phosphorus: 3.2 mg/dL (ref 2.5–4.6)
Phosphorus: 3.9 mg/dL (ref 2.5–4.6)

## 2017-06-30 MED ORDER — FUROSEMIDE 10 MG/ML IJ SOLN
60.0000 mg | Freq: Once | INTRAMUSCULAR | Status: AC
  Administered 2017-06-30: 60 mg via INTRAVENOUS
  Filled 2017-06-30: qty 6

## 2017-06-30 MED ORDER — POTASSIUM CHLORIDE 20 MEQ/15ML (10%) PO SOLN
40.0000 meq | Freq: Once | ORAL | Status: AC
  Administered 2017-06-30: 40 meq
  Filled 2017-06-30: qty 30

## 2017-06-30 MED ORDER — CARVEDILOL 3.125 MG PO TABS
3.1250 mg | ORAL_TABLET | Freq: Two times a day (BID) | ORAL | Status: DC
  Administered 2017-06-30 – 2017-07-01 (×3): 3.125 mg via ORAL
  Filled 2017-06-30 (×3): qty 1

## 2017-06-30 MED ORDER — METHYLPREDNISOLONE SODIUM SUCC 125 MG IJ SOLR: 60.0000 mg | Freq: Every day | INTRAMUSCULAR | Status: DC

## 2017-06-30 NOTE — Progress Notes (Signed)
Progress Note  Patient Name: Kari [name] Date of Encounter: 06/30/2017  Primary Cardiologist:  Dr. Harrington Challenger  Subjective   Intubated and awake.  Denies chest pain.    Inpatient Medications    Scheduled Meds: . arformoterol  15 mcg Nebulization BID  . aspirin  300 mg Rectal Daily  . budesonide (PULMICORT) nebulizer solution  0.5 mg Nebulization BID  . chlorhexidine gluconate (MEDLINE KIT)  15 mL Mouth Rinse BID  . Chlorhexidine Gluconate Cloth  6 each Topical Daily  . collagenase   Topical Daily  . feeding supplement (PRO-STAT SUGAR FREE 64)  30 mL Per Tube Daily  . feeding supplement (VITAL HIGH PROTEIN)  1,000 mL Per Tube Q24H  . heparin  5,000 Units Subcutaneous Q8H  . insulin aspart  2-6 Units Subcutaneous Q4H  . insulin glargine  35 Units Subcutaneous Q24H  . ipratropium-albuterol  3 mL Nebulization Q6H  . mouth rinse  15 mL Mouth Rinse 10 times per day  . methylPREDNISolone (SOLU-MEDROL) injection  60 mg Intravenous Q12H  . sodium chloride flush  10-40 mL Intracatheter Q12H   Continuous Infusions: . sodium chloride 10 mL/hr at 06/29/17 1900  . amiodarone 30 mg/hr (06/29/17 2048)  . dexmedetomidine (PRECEDEX) IV infusion 0.4 mcg/kg/hr (06/30/17 0400)  . famotidine (PEPCID) IV Stopped (06/29/17 2225)  . fentaNYL infusion INTRAVENOUS 150 mcg/hr (06/30/17 0213)  . piperacillin-tazobactam (ZOSYN)  IV 3.375 g (06/30/17 0606)   PRN Meds: fentaNYL, hydrALAZINE, sodium chloride flush   Vital Signs    Vitals:   06/30/17 0400 06/30/17 0500 06/30/17 0600 06/30/17 0700  BP: 136/70 129/79 126/69 116/68  Pulse: 62 73 (!) 58 66  Resp: _0 Temp: 98.6 F (37 C)  98.6 F (37 C)   TempSrc: Core (Comment)  Core (Comment)   SpO2: 100% 98% 99% 99%  Weight:      Height:        Intake/Output Summary (Last 24 hours) at 06/30/17 0741 Last data filed at 06/30/17 0700  Gross per 24 hour  Intake           3088.4 ml  Output             7580 ml  Net          -4491.6 ml     Filed Weights   06/28/17 0600 06/29/17 0600 06/30/17 0200  Weight: (!) 317 lb 13.4 oz (144.2 kg) (!) 318 lb 4.5 oz (144.4 kg) (!) 310 lb 12.2 oz (141 kg)    Telemetry    NSR with PVCs - Personally Reviewed  ECG    NA - Personally Reviewed  Physical Exam   GEN: No  acute distress.   Neck:   Unable to assess JVD Cardiac:  RRR, no murmurs, rubs, or gallops.  Respiratory: Clear   to auscultation bilaterally. GI: Soft, nontender, non-distended, normal bowel sounds  MS:  Mod edema; No deformity. Neuro:   Nonfocal , moves all extremities.  Psych: Intubated and sedated.   Labs    Chemistry Recent Labs Lab 06/26/17 1533  06/29/17 0345 06/29/17 2045 06/30/17 0424  NA 138  < > 137 137 137  K 3.1*  < > 4.2 3.8 3.8  CL 107  < > 108 108 105  CO2 19*  < > 21* 21* 23  GLUCOSE 202*  < > 151* 153* 161*  BUN 22*  < > 25* 31* 38*  CREATININE 1.39*  < > 1.46* 1.58* 1.62*  CALCIUM 8.4*  < >  8.5* 8.5* 8.6*  PROT 7.9  --   --   --   --   ALBUMIN 3.2*  --   --   --   --   AST 168*  --   --   --   --   ALT 108*  --   --   --   --   ALKPHOS 71  --   --   --   --   BILITOT 0.7  --   --   --   --   GFRNONAA 42*  < > 39* 36* 35*  GFRAA 48*  < > 46* 41* 40*  ANIONGAP 12  < > _0 < > = values in this interval not displayed.   Hematology  Recent Labs Lab 06/28/17 0609 06/29/17 0345 06/30/17 0424  WBC 17.5* 13.3* 14.5*  RBC 3.92 4.11 4.15  HGB 11.2* 11.6* 12.1  HCT 34.5* 37.3 37.6  MCV 88.0 90.8 90.6  MCH 28.6 28.2 29.2  MCHC 32.5 31.1 32.2  RDW 14.4 15.2 14.9  PLT 226 219 215    Cardiac Enzymes  Recent Labs Lab 06/26/17 2047 06/27/17 0429 06/27/17 0840  TROPONINI 0.09* 0.05* 0.04*     Recent Labs Lab 06/26/17 1539  TROPIPOC 0.01     BNPNo results for input(s): BNP, PROBNP in the last 168 hours.   DDimer No results for input(s): DDIMER in the last 168 hours.   Radiology    Dg Chest Port 1 View  Result Date: 06/30/2017 CLINICAL DATA:  Acute respiratory  failure with hypoxia. EXAM: PORTABLE CHEST 1 VIEW COMPARISON:  06/29/2017 and 06/28/2017. FINDINGS: 0515 hours. Patient rotated to the left. Endotracheal tube tip is about 2.6 cm above the base of the carina. Left IJ central line tip is stable, projecting over the location of the mid SVC. Pulmonary vascular congestion again noted with retrocardiac left base collapse/ consolidation. The NG tube passes into the stomach although the distal tip position is not included on the film. Telemetry leads overlie the chest. IMPRESSION: 1. No substantial change in exam. 2. Low volume film with cardiomegaly and vascular congestion. 3. Similar appearance retrocardiac left base collapse/consolidation. Electronically Signed   By: Misty Stanley M.D.   On: 06/30/2017 07:19   Dg Chest Port 1 View  Result Date: 06/29/2017 CLINICAL DATA:  Patient history of ET tube. EXAM: PORTABLE CHEST 1 VIEW COMPARISON:  Chest radiograph 06/28/2017. FINDINGS: ET tube terminates in the mid trachea. Enteric tube courses inferior to the diaphragm. Left IJ approach central venous catheter tip projects over the superior vena cava. Monitoring leads overlie the patient. Pacer pads overlie the patient. Stable cardiomegaly. Low lung volumes. Perihilar interstitial opacities, stable to mildly improved. Basilar heterogeneous opacities. No pleural effusion or pneumothorax. IMPRESSION: 1. Stable support apparatus. 2. Low lung volumes with mild improved interstitial opacities, potentially representing atelectasis and basilar atelectasis. 3. Possible small pleural effusions. Electronically Signed   By: Lovey Newcomer M.D.   On: 06/29/2017 07:22    Cardiac Studies   ECHO:    - Left ventricle: Diffuse hypokinesis worse in the inferior basal   wall The cavity size was moderately dilated. Wall thickness was   increased in a pattern of moderate LVH. Systolic function was   mildly to moderately reduced. The estimated ejection fraction was   in the range of 40%  to 45%. Left ventricular diastolic function   parameters were normal. - Aortic valve: There was mild regurgitation. - Mitral valve:  There was moderate regurgitation. - Left atrium: The atrium was moderately dilated. - Atrial septum: No defect or patent foramen ovale was identified.  Patient Profile     56 y.o. female with past medical history of sarcoid, hypertension, hyperlipidemia, diastolic congestive heart failure, asthma admitted following cardiac arrest. Patient received 5-10 minutes of CPR following ventricular fibrillation arrest. She required one defibrillation to sinus rhythm. Echocardiogram shows ejection fraction 40-45%, moderate LVH, moderate LVE, mild aortic insufficiency, moderate mitral regurgitation and moderate left atrial enlargement.  Assessment & Plan    CARDIAC ARREST:    Etiology not clear.  She will need cath eventually.  Mildly reduced EF this admission.  BPs have been up.  I will start a low dose of Coreg.  HR is borderline for higher doses.    HTN:  Continue with PRN hydralazine.   AKI :   Creat increasing.  Avoiding ACE inhibitor or ARB.    PAF:  On IV amio.   NSR.  Rare PVCs.   OK to stop amio.    Signed, Minus Breeding, MD  06/30/2017, 7:41 AM

## 2017-06-30 NOTE — Progress Notes (Signed)
PULMONARY / CRITICAL CARE MEDICINE   Name: Kari [name] MRN: [id number] DOB: 1961/12/05    ADMISSION DATE:  06/26/2017 CONSULTATION DATE:  06/26/17  REFERRING MD:  Dr. Eulis Foster  CHIEF COMPLAINT:  Cardiac Arrest  BRIEF SUMMARY: 56 year old female admitted after witnessed cardiac arrest.  PMH significant for asthma, heart failure (11/2016 EF 55-60%) , HTN, Questionable sarcoidosis (based on mediastinal lymphadenopathy no biopsy), HLD, obesity, chronic BLE ulcers, large cystic ovarian mass, headache and depression.  CT head negative.  EEG with diffuse slowing.    SUBJECTIVE:  RN reports low K, no acute events.  Concerned WOC needs to review LE wounds again as they are doing Santyl on wounds.  Neg 4.5L with lasix.      VITAL SIGNS: BP 129/79   Pulse 73   Temp 98.6 F (37 C) (Core (Comment))   Resp 18   Ht 5\' 5"  (1.651 m)   Wt (!) 310 lb 12.2 oz (141 kg)   LMP 11/21/2011   SpO2 98%   BMI 51.71 kg/m   HEMODYNAMICS: CVP:  [6 mmHg-21 mmHg] 12 mmHg  VENTILATOR SETTINGS: Vent Mode: PRVC FiO2 (%):  [30 %-40 %] 30 % Set Rate:  [18 bmp] 18 bmp Vt Set:  [540 mL] 540 mL PEEP:  [5 cmH20] 5 cmH20 Pressure Support:  [12 cmH20] 12 cmH20 Plateau Pressure:  [21 [id number] cmH20] 22 cmH20  INTAKE / OUTPUT: I/O last 3 completed shifts: In: 3755.1 [I.V.:2265.1; NG/GT:840; IV Piggyback:650] Out: 0347 [QQVZD:6387; Emesis/NG output:30]  PHYSICAL EXAMINATION:  General: obese female in NAD on vent    HEENT: MM pink/moist, ETT PSY: calm Neuro: Awakens to voice, nods appropriately, pt updated on events that led to her admit, follows commands CV: s1s2 rrr, no m/r/g PULM: even/non-labored, lungs bilaterally diminished but clear  FI:EPPI, non-tender, bsx4 active  Extremities: warm/dry, improved edema, 1-2+ generalized  Skin: no rashes.  BLE venous stasis ulcers wrapped in ACE wrap  LABS:  BMET  Recent Labs Lab 06/29/17 0345 06/29/17 2045 06/30/17 0424  NA 137 137 137  K 4.2 3.8 3.8   CL 108 108 105  CO2 21* 21* 23  BUN 25* 31* 38*  CREATININE 1.46* 1.58* 1.62*  GLUCOSE 151* 153* 161*   Electrolytes  Recent Labs Lab 06/29/17 0345 06/29/17 1345 06/29/17 2045 06/30/17 0424 06/30/17 0430  CALCIUM 8.5*  --  8.5* 8.6*  --   MG 2.0 2.2  --   --  2.0  PHOS 3.7 3.8  --   --  3.2    CBC  Recent Labs Lab 06/28/17 0609 06/29/17 0345 06/30/17 0424  WBC 17.5* 13.3* 14.5*  HGB 11.2* 11.6* 12.1  HCT 34.5* 37.3 37.6  PLT 226 219 215    Coag's  Recent Labs Lab 06/26/17 2047 06/27/17 0429  APTT 31 30  INR 1.11 1.15   Sepsis Markers  Recent Labs Lab 06/26/17 1541 06/26/17 2047 06/27/17 0429 06/28/17 0157  LATICACIDVEN 6.91* 3.0*  --   --   PROCALCITON  --  0.19 0.32 0.33   ABG  Recent Labs Lab 06/27/17 0806 06/28/17 0346 06/29/17 0340  PHART 7.265* 7.482* 7.412  PCO2ART 36.5 26.9* 33.9  PO2ART 145.0* 120* 105   Liver Enzymes  Recent Labs Lab 06/26/17 1533  AST 168*  ALT 108*  ALKPHOS 71  BILITOT 0.7  ALBUMIN 3.2*   Cardiac Enzymes  Recent Labs Lab 06/26/17 2047 06/27/17 0429 06/27/17 0840  TROPONINI 0.09* 0.05* 0.04*   Glucose  Recent Labs Lab  06/29/17 0800 06/29/17 1141 06/29/17 1553 06/29/17 1932 06/29/17 2329 06/30/17 0413  GLUCAP 145* 136* 145* 140* 145* 164*   Imaging Dg Chest Port 1 View  Result Date: 06/30/2017 CLINICAL DATA:  Acute respiratory failure with hypoxia. EXAM: PORTABLE CHEST 1 VIEW COMPARISON:  06/29/2017 and 06/28/2017. FINDINGS: 0515 hours. Patient rotated to the left. Endotracheal tube tip is about 2.6 cm above the base of the carina. Left IJ central line tip is stable, projecting over the location of the mid SVC. Pulmonary vascular congestion again noted with retrocardiac left base collapse/ consolidation. The NG tube passes into the stomach although the distal tip position is not included on the film. Telemetry leads overlie the chest. IMPRESSION: 1. No substantial change in exam. 2. Low  volume film with cardiomegaly and vascular congestion. 3. Similar appearance retrocardiac left base collapse/consolidation. Electronically Signed   By: Misty Stanley M.D.   On: 06/30/2017 07:19     STUDIES:  11/2016 TTE >> LVEF 55-60%, G1DD,  Mild AR, mild MR, mildly dilated LA, PAP 47 mm Hg EKG 7/4 >> ST 117, diffuse STD CXR 7/4 >> ETT at clavicular heads, OGT in stomach, chronic cardiomegaly, low volume chest with perihilar atelectasis and vascular congestion  TTE 7/5 >> Increased wall thickening, EF 40-45% with some valvular concerns as above EEG 7/5 >> diffuse slowness, no seizure activity, non-specific  CULTURES: BCx2 7/4 >> Sputum 7/5 >> abundant WBC >>  ANTIBIOTICS: Vanc 7/5 >> 7/7 Zosyn 7/5 >>  SIGNIFICANT EVENTS: 7/04 Admit Vfib arrest 7/06 Following some commands 7/07 Weaned 2.5 hours on PSV  LINES/TUBES: 7/4 ETT >> 7/4 OGT >> 7/4 foley >> 7/4 L IJ TLC >> 7/4 R femoral a-line >> 7/8  DISCUSSION: 67HAL with diastolic heart failure, obesity, HTN, Asthma, Sarcoidosis, with witnessed VF arrest.  5-10 mins of CPR, 1 epi, and 1 defib given prior to ROSC.  Patient unresponsive following and therefore TTM started. Unknown of any patient complaints prior to arrest.  Rewarmed, alert on 7/7, weaned 2.5 hours.    ASSESSMENT / PLAN:  PULMONARY A: Acute Hypoxic Respiratory Failure - in setting of pulmonary edema Acute Hypercapneic respiratory failure Aspiration pneumonia Asthma exacerbation? - reportedly was asymptomatic prior to the arrest, so do not feel this arrest was caused by an asthma exacerbation. However, wheezing on presentation. P: PRVC 8 cc/kg  Wean PEEP / FiO2 for sats > 92% Trend CXR Continue solumedrol, reduce to 60 mg IV QD Brovana + Pulmicort  Duoneb Q6 SBT as tolerated  Chair position  CARDIOVASCULAR A: Vfib Cardiac Arrest - estimated  5-10 min of CPR prior to ROSC.  Unclear if this arrest was due to coronary ischemia vs a primary arrhythmia;  unclear the history of her sarcoidosis and if she could've had any cardiac involvement which would predispose to arrhythmia.  Acute CHF with history of diastolic HF (93/79 EF 02-40%) HTN HLD  ? Cardiac sarcoidosis  P: Cardiology following, appreciate input  ICU monitoring  Amiodarone gtt  PRN hydralazine for SBP > 170 Will need cardiac work up once off vent & renal recovery.  Await timing of LHC.  Discontinue aline  RENAL A: AKI-on-CKD Hypokalemia NAG acidosis P: Lasix 60 mg IV x1 with rising sr cr Trend BMP / urinary output Replace electrolytes as indicated Avoid nephrotoxic agents as able, ensure adequate renal perfusion  GASTROINTESTINAL A: Morbid Obesity  P:  NPO / OGT Pepcid for SUP  TF per nutrition  HEMATOLOGIC A: Anemia P: Trend CBC Heparin for DVT  prophylaxis    INFECTIOUS A: Aspiration PNA  BLE Venous Ulcers P: Continue zosyn  Follow cultures to maturity  Wound care for BLE venous ulcers  ENDOCRINE  A: Hyperglycemia without DM history P: SSI  Lantus 35 units QD.  May need adjustment as she comes off steroids  NEUROLOGIC A: Anoxic metabolic Encephalopathy - head CT neg, UDS neg>  Waking up on exam 7/7. P: RASS goal: 0 to -1  Follow serial neuro exams  Early PT efforts  Fentanyl gtt for pain  Precedex gtt  Daily WUA / SBT  GU A: Large cystic ovarian mass - diagnosed 03/2017, surgery postponed due to acute issues with AKI, hypokalemia at the time, unclear if malignant vs benign P: Outpatient work up post discharge   FAMILY  - Updates: Sisters x2 updated at bedside on am NP rounds.   - Inter-disciplinary family meet or Palliative Care meeting due by:  07/03/17  CC Time: 76 minutes   Noe Gens, NP-C [facility] Pgr: [phone] or if no answer [phone] 06/30/2017, 7:26 AM

## 2017-07-01 ENCOUNTER — Inpatient Hospital Stay (HOSPITAL_COMMUNITY): Payer: Medicare Other

## 2017-07-01 LAB — BASIC METABOLIC PANEL
Anion gap: 7 (ref 5–15)
BUN: 44 mg/dL — ABNORMAL HIGH (ref 6–20)
CO2: 26 mmol/L (ref 22–32)
Calcium: 8.8 mg/dL — ABNORMAL LOW (ref 8.9–10.3)
Chloride: 102 mmol/L (ref 101–111)
Creatinine, Ser: 1.45 mg/dL — ABNORMAL HIGH (ref 0.44–1.00)
GFR calc Af Amer: 46 mL/min — ABNORMAL LOW (ref >60)
GFR calc non Af Amer: 40 mL/min — ABNORMAL LOW (ref >60)
Glucose, Bld: 128 mg/dL — ABNORMAL HIGH (ref 65–99)
Potassium: 3.7 mmol/L (ref 3.5–5.1)
Sodium: 135 mmol/L (ref 135–145)

## 2017-07-01 LAB — GLUCOSE, CAPILLARY
Glucose-Capillary: 151 mg/dL — ABNORMAL HIGH (ref 65–99)
Glucose-Capillary: 111 mg/dL — ABNORMAL HIGH (ref 65–99)
Glucose-Capillary: 107 mg/dL — ABNORMAL HIGH (ref 65–99)
Glucose-Capillary: 98 mg/dL (ref 65–99)
Glucose-Capillary: 83 mg/dL (ref 65–99)
Glucose-Capillary: 85 mg/dL (ref 65–99)
Glucose-Capillary: 88 mg/dL (ref 65–99)

## 2017-07-01 LAB — CULTURE, BLOOD (ROUTINE X 2)
Culture: NO GROWTH
Culture: NO GROWTH
Special Requests: ADEQUATE
Special Requests: ADEQUATE

## 2017-07-01 LAB — POCT I-STAT, CHEM 8
BUN: 40 mg/dL — ABNORMAL HIGH (ref 6–20)
BUN: 39 mg/dL — ABNORMAL HIGH (ref 6–20)
Calcium, Ion: 1.22 mmol/L (ref 1.15–1.40)
Calcium, Ion: 1.15 mmol/L (ref 1.15–1.40)
Chloride: 99 mmol/L — ABNORMAL LOW (ref 101–111)
Chloride: 102 mmol/L (ref 101–111)
Creatinine, Ser: 1.4 mg/dL — ABNORMAL HIGH (ref 0.44–1.00)
Creatinine, Ser: 1.3 mg/dL — ABNORMAL HIGH (ref 0.44–1.00)
Glucose, Bld: 101 mg/dL — ABNORMAL HIGH (ref 65–99)
Glucose, Bld: 80 mg/dL (ref 65–99)
HCT: 43 % (ref 36.0–46.0)
HCT: 40 % (ref 36.0–46.0)
Hemoglobin: 14.6 g/dL (ref 12.0–15.0)
Hemoglobin: 13.6 g/dL (ref 12.0–15.0)
Potassium: 3.5 mmol/L (ref 3.5–5.1)
Potassium: 3.5 mmol/L (ref 3.5–5.1)
Sodium: 142 mmol/L (ref 135–145)
Sodium: 143 mmol/L (ref 135–145)
TCO2: 29 mmol/L (ref 0–100)
TCO2: 28 mmol/L (ref 0–100)

## 2017-07-01 LAB — CBC
HCT: 36.7 % (ref 36.0–46.0)
Hemoglobin: 11.4 g/dL — ABNORMAL LOW (ref 12.0–15.0)
MCH: 28.1 pg (ref 26.0–34.0)
MCHC: 31.1 g/dL (ref 30.0–36.0)
MCV: 90.4 fL (ref 78.0–100.0)
Platelets: 216 10*3/uL (ref 150–400)
RBC: 4.06 MIL/uL (ref 3.87–5.11)
RDW: 14.7 % (ref 11.5–15.5)
WBC: 12 10*3/uL — ABNORMAL HIGH (ref 4.0–10.5)

## 2017-07-01 LAB — MAGNESIUM
Magnesium: 2.2 mg/dL (ref 1.7–2.4)
Magnesium: 2.4 mg/dL (ref 1.7–2.4)

## 2017-07-01 LAB — PHOSPHORUS
Phosphorus: 2.8 mg/dL (ref 2.5–4.6)
Phosphorus: 2.7 mg/dL (ref 2.5–4.6)

## 2017-07-01 MED ORDER — FUROSEMIDE 10 MG/ML IJ SOLN
40.0000 mg | Freq: Four times a day (QID) | INTRAMUSCULAR | Status: AC
  Administered 2017-07-01 (×2): 40 mg via INTRAVENOUS
  Filled 2017-07-01 (×2): qty 4

## 2017-07-01 MED ORDER — POTASSIUM CHLORIDE 10 MEQ/50ML IV SOLN
10.0000 meq | INTRAVENOUS | Status: AC
Start: 2017-07-01 — End: 2017-07-01
  Administered 2017-07-01 (×4): 10 meq via INTRAVENOUS
  Filled 2017-07-01 (×3): qty 50

## 2017-07-01 MED ORDER — ORAL CARE MOUTH RINSE: 15.0000 mL | Freq: Two times a day (BID) | OROMUCOSAL | Status: DC

## 2017-07-01 MED ORDER — POTASSIUM CHLORIDE 10 MEQ/50ML IV SOLN
10.0000 meq | INTRAVENOUS | Status: AC
  Administered 2017-07-01 – 2017-07-02 (×6): 10 meq via INTRAVENOUS
  Filled 2017-07-01 (×6): qty 50

## 2017-07-01 MED ORDER — ORAL CARE MOUTH RINSE
15.0000 mL | Freq: Two times a day (BID) | OROMUCOSAL | Status: DC
  Administered 2017-07-01 – 2017-07-06 (×7): 15 mL via OROMUCOSAL

## 2017-07-01 MED ORDER — CARVEDILOL 6.25 MG PO TABS
6.2500 mg | ORAL_TABLET | Freq: Two times a day (BID) | ORAL | Status: DC
  Administered 2017-07-02: 6.25 mg via ORAL
  Filled 2017-07-01 (×2): qty 1

## 2017-07-01 MED ORDER — IBUPROFEN 200 MG PO TABS
400.0000 mg | ORAL_TABLET | Freq: Once | ORAL | Status: AC
  Administered 2017-07-01: 400 mg via ORAL
  Filled 2017-07-01: qty 2

## 2017-07-01 MED ORDER — IPRATROPIUM-ALBUTEROL 0.5-2.5 (3) MG/3ML IN SOLN: 3.0000 mL | Freq: Four times a day (QID) | RESPIRATORY_TRACT | Status: DC | PRN

## 2017-07-01 MED ORDER — POTASSIUM CHLORIDE 20 MEQ/15ML (10%) PO SOLN
40.0000 meq | Freq: Once | ORAL | Status: AC
  Administered 2017-07-01: 40 meq
  Filled 2017-07-01: qty 30

## 2017-07-01 MED ORDER — PREDNISONE 5 MG/5ML PO SOLN
30.0000 mg | Freq: Every day | ORAL | Status: DC
  Filled 2017-07-01 (×2): qty 30

## 2017-07-01 MED FILL — Medication: Qty: 1 | Status: AC

## 2017-07-01 NOTE — Progress Notes (Signed)
Pharmacy Antibiotic Note  Kari [name] is a 56 y.o. female admitted on 06/26/2017 with cardiac arrest.  Pharmacy has been consulted for Zosyn for suspected pneumonia. Plans are for 5 days total of therapy (today is day 5) -WBC= 12, afeb, blood cx- ngtd   Plan: -Stop zosyn after the last dose today -Will sign off. Please contact pharmacy with any other needs.  Thank you, Hildred Laser, Pharm D 07/01/2017 9:36 AM      Height: 5\' 5"  (165.1 cm) Weight: (!) 304 lb 10.8 oz (138.2 kg) IBW/kg (Calculated) : 57  Temp (24hrs), Avg:98.5 F (36.9 C), Min:97.7 F (36.5 C), Max:98.9 F (37.2 C)   Recent Labs Lab 06/26/17 1541  06/26/17 2047  06/27/17 0429  06/28/17 0609  06/28/17 1319 06/29/17 0345 06/29/17 0850 06/29/17 2045 06/30/17 0424 07/01/17 0435  WBC  --   --   --   --  7.2  --  17.5*  --   --  13.3*  --   --  14.5* 12.0*  CREATININE  --   < > 1.31*  < > 1.21*  1.18*  < > 1.10*  < > 1.29* 1.46*  --  1.58* 1.62* 1.45*  LATICACIDVEN 6.91*  --  3.0*  --   --   --   --   --   --   --   --   --   --   --   VANCOTROUGH  --   --   --   --   --   --   --   --   --   --  36*  --   --   --   < > = values in this interval not displayed.  Estimated Creatinine Clearance: 61.9 mL/min (A) (by C-G formula based on SCr of 1.45 mg/dL (H)).    No Known Allergies  Antimicrobials this admission: 7/4 vancomcyin>> 7/7 7/4 zosyn>> 7/9  7/4 bld x2: ngtd 7/4 resp cx: rare gpc/gnr>> normal flora  Thank you for allowing pharmacy to be a part of this patient's care.  Hildred Laser, Pharm D 07/01/2017 9:36 AM

## 2017-07-01 NOTE — Progress Notes (Signed)
eLink Physician-Brief Progress Note Patient Name: Kari [name] DOB: 1961-09-03 MRN: [id number]   Date of Service  07/01/2017  HPI/Events of Note  Multiple issues:  1. K+ = 3.5 and Creatinine = 1.3 and 2. Off enteral feeds and Lantus dose scheduled for this evening.   eICU Interventions  Will order: 1. Replace K+.  2. Hold evening Lantus dose.      Intervention Category Major Interventions: Electrolyte abnormality - evaluation and management  Sommer,Steven Eugene 07/01/2017, 9:45 PM

## 2017-07-01 NOTE — Procedures (Signed)
Extubation Procedure Note  Patient Details:   Name: Kari [name] DOB: August 04, 1961 MRN: [id number]   Airway Documentation:     Evaluation  O2 sats: stable throughout Complications: No apparent complications Patient did tolerate procedure well. Bilateral Breath Sounds: Clear, Diminished   Yes   Pt extubated to 3L N/C.  No stridor noted.  RN at bedside.  Donnetta Hail 07/01/2017, 10:42 AM

## 2017-07-01 NOTE — Progress Notes (Signed)
Chaplain stopped by while rounding the floor to speak with this patient who was recently extubated.  Bed is in sitting position and patient with sister and daughter at bedside is very emotional and teary about her recent experience.  Chaplain presence was appreciated and welcomed.  Patient spoke words of encouragement to the patient, assured patient of Chaplain availability going forward and provided ministry of presence.  Please page this chaplain as needed.  Theodoro Doing [phone]

## 2017-07-01 NOTE — Progress Notes (Signed)
PULMONARY / CRITICAL CARE MEDICINE   Name: Kari [name] MRN: [id number] DOB: [birth year]    ADMISSION DATE:  06/26/2017 CONSULTATION DATE:  06/26/17  REFERRING MD:  Dr. Eulis Foster  CHIEF COMPLAINT:  Cardiac Arrest  BRIEF SUMMARY: 56 year old female admitted after witnessed cardiac arrest.  PMH significant for asthma, heart failure (11/2016 EF 55-60%) , HTN, Questionable sarcoidosis (based on mediastinal lymphadenopathy no biopsy), HLD, obesity, chronic BLE ulcers, large cystic ovarian mass, headache and depression.  CT head negative.  EEG with diffuse slowing.    SUBJECTIVE:  Some mild agitation on vent this morning, but weaning well on vent, following commands    VITAL SIGNS: BP (!) 145/81   Pulse (!) 36   Temp 98.9 F (37.2 C) (Axillary)   Resp 18   Ht 5\' 5"  (1.651 m)   Wt (!) 138.2 kg (304 lb 10.8 oz)   LMP 11/21/2011   SpO2 100%   BMI 50.70 kg/m   HEMODYNAMICS: CVP:  [10 mmHg-13 mmHg] 12 mmHg  VENTILATOR SETTINGS: Vent Mode: CPAP;PSV FiO2 (%):  [30 %] 30 % Set Rate:  [18 bmp] 18 bmp Vt Set:  [540 mL] 540 mL PEEP:  [5 cmH20] 5 cmH20 Pressure Support:  [12 cmH20] 12 cmH20 Plateau Pressure:  [20 BDZ32-99 cmH20] 24 cmH20  INTAKE / OUTPUT: I/O last 3 completed shifts: In: 3840.7 [I.V.:1360.7; NG/GT:1930; IV Piggyback:550] Out: 10350 [MEQAS:34196]  PHYSICAL EXAMINATION:    General:  In bed on vent HENT: NCAT ETT in place PULM: CTA B, vent supported breathing CV: RRR, no mgr GI: BS+, soft, nontender MSK: normal bulk and tone Derm: ace wrap ankles, chronic venous stasis ulcers Neuro: awake on vent, following commands    LABS:  BMET  Recent Labs Lab 06/29/17 2045 06/30/17 0424 07/01/17 0435  NA 137 137 135  K 3.8 3.8 3.7  CL 108 105 102  CO2 21* 23 26  BUN 31* 38* 44*  CREATININE 1.58* 1.62* 1.45*  GLUCOSE 153* 161* 128*   Electrolytes  Recent Labs Lab 06/29/17 2045 06/30/17 0424 06/30/17 0430 06/30/17 1656 07/01/17 0435  CALCIUM 8.5*  8.6*  --   --  8.8*  MG  --   --  2.0 2.2 2.2  PHOS  --   --  3.2 3.9 2.8    CBC  Recent Labs Lab 06/29/17 0345 06/30/17 0424 07/01/17 0435  WBC 13.3* 14.5* 12.0*  HGB 11.6* 12.1 11.4*  HCT 37.3 37.6 36.7  PLT 219 215 216    Coag's  Recent Labs Lab 06/26/17 2047 06/27/17 0429  APTT 31 30  INR 1.11 1.15   Sepsis Markers  Recent Labs Lab 06/26/17 1541 06/26/17 2047 06/27/17 0429 06/28/17 0157  LATICACIDVEN 6.91* 3.0*  --   --   PROCALCITON  --  0.19 0.32 0.33   ABG  Recent Labs Lab 06/27/17 0806 06/28/17 0346 06/29/17 0340  PHART 7.265* 7.482* 7.412  PCO2ART 36.5 26.9* 33.9  PO2ART 145.0* 120* 105   Liver Enzymes  Recent Labs Lab 06/26/17 1533  AST 168*  ALT 108*  ALKPHOS 71  BILITOT 0.7  ALBUMIN 3.2*   Cardiac Enzymes  Recent Labs Lab 06/26/17 2047 06/27/17 0429 06/27/17 0840  TROPONINI 0.09* 0.05* 0.04*   Glucose  Recent Labs Lab 06/30/17 0735 06/30/17 1142 06/30/17 1548 06/30/17 2312 07/01/17 0349 07/01/17 0757  GLUCAP 135* 111* 145* 112* 111* 107*   Imaging Dg Chest Port 1 View  Result Date: 07/01/2017 CLINICAL DATA:  Acute respiratory failure with hypoxia.  Intubated. EXAM: PORTABLE CHEST 1 VIEW COMPARISON:  Chest radiograph from one day prior. FINDINGS: Endotracheal tube tip is 3.3 cm above the carina. Enteric tube enters stomach with the tip not seen on this image. Left internal jugular central venous catheter terminates in the middle third of the superior vena cava. Stable cardiomediastinal silhouette with mild cardiomegaly. No pneumothorax. No pleural effusion. Patchy bibasilar consolidation, left greater than right, stable. No overt pulmonary edema. IMPRESSION: 1. Well-positioned support structures. 2. Stable mild cardiomegaly without overt pulmonary edema. 3. Stable patchy bibasilar lung consolidation, left greater than right. Electronically Signed   By: Ilona Sorrel M.D.   On: 07/01/2017 07:41     STUDIES:  11/2016 TTE  >> LVEF 55-60%, G1DD,  Mild AR, mild MR, mildly dilated LA, PAP 47 mm Hg EKG 7/4 >> ST 117, diffuse STD CXR 7/4 >> ETT at clavicular heads, OGT in stomach, chronic cardiomegaly, low volume chest with perihilar atelectasis and vascular congestion  TTE 7/5 >> Increased wall thickening, EF 40-45% with some valvular concerns as above EEG 7/5 >> diffuse slowness, no seizure activity, non-specific  CULTURES: BCx2 7/4 >> Sputum 7/5 >> abundant WBC >>OPF  ANTIBIOTICS: Vanc 7/5 >> 7/7 Zosyn 7/5 >> 7/9  SIGNIFICANT EVENTS: 7/04 Admit Vfib arrest 7/06 Following some commands 7/07 Weaned 2.5 hours on PSV  LINES/TUBES: 7/4 ETT >> 7/4 OGT >> 7/4 foley >> 7/4 L IJ TLC >> 7/4 R femoral a-line >> 7/8  DISCUSSION: 7YIR with diastolic heart failure, obesity, HTN, Asthma, Sarcoidosis, with witnessed VF arrest.  5-10 mins of CPR, 1 epi, and 1 defib given prior to ROSC.  Patient unresponsive following and therefore TTM started. Unknown of any patient complaints prior to arrest.  Rewarmed, alert on 7/7, weaned 2.5 hours.  Weaning well 7/9, following commands.  ASSESSMENT / PLAN:  PULMONARY A: Acute Hypoxic Respiratory Failure - in setting of pulmonary edema Acute Hypercapneic respiratory failure Aspiration pneumonia> improving Asthma exacerbation? - reportedly was asymptomatic prior to the arrest, so do not feel this arrest was caused by an asthma exacerbation. However, wheezing on presentation. Sarcoidosis> details of diagnosis uncertain P: PSV as long as tolerated today Continue brovana/pulmicort Change solumedrol to prednisone 40mg  daily Full mechanical vent support VAP prevention Daily WUA/SBT Need to find pathology to confirm sarcoidosis diagnosis  CARDIOVASCULAR A: Vfib Cardiac Arrest - estimated  5-10 min of CPR prior to ROSC.  Unclear if this arrest was due to coronary ischemia vs a primary arrhythmia; unclear the history of her sarcoidosis and if she could've had any cardiac  involvement which would predispose to arrhythmia.  Acute CHF with history of diastolic HF (48/54 EF 62-70%) HTN HLD  ? Cardiac sarcoidosis  P: Tele Cards to plan timing of LHC Cardiac MRI at some point Amiodarone per cardiology Keep K > 4, Mg > 2  RENAL A: AKI-on-CKD > improving Hypokalemia > mild P: Monitor BMET and UOP Replace electrolytes as needed Lasix again today  GASTROINTESTINAL A: Morbid Obesity  P:  Continue tube feeding Continue pepcid  HEMATOLOGIC A: Anemia P: Monitor for bleeding  INFECTIOUS A: Aspiration PNA > improving BLE Venous Ulcers P: Wound care Stop zosyn after 5 days  ENDOCRINE  A: Hyperglycemia without DM history P: SSI  Continue lantus  NEUROLOGIC A: Anoxic metabolic Encephalopathy - improving P: RASS goal 0 to -1 Continue fentanyl/precedex infusions Will need PT consult post extubation  GU A: Large cystic ovarian mass - diagnosed 03/2017, surgery postponed due to acute issues with AKI, hypokalemia  at the time, unclear if malignant vs benign P: Outpatient work up post discharge   FAMILY  - Updates: sister updated again 7/9 by me   - Inter-disciplinary family meet or Palliative Care meeting due by:  07/03/17  CC time 35 minutes  Roselie Awkward, MD [facility] Pager: [phone] Cell: [phone] After 3pm or if no response, call [phone]  07/01/2017, 9:24 AM

## 2017-07-01 NOTE — Progress Notes (Signed)
[facility] Progress Note Patient Name: Kari [name] DOB: 11-05-1961 MRN: [id number]   Date of Service  07/01/2017  HPI/Events of Note  K+ = 3.5 and Creatinine = 1.45. Patient to receive more Lasix.   eICU Interventions  Will replace K+.     Intervention Category Major Interventions: Electrolyte abnormality - evaluation and management  Kari [name] 07/01/2017, 5:07 PM

## 2017-07-01 NOTE — Progress Notes (Signed)
Progress Note  Patient Name: Kari [name] Date of Encounter: 07/01/2017  Primary Cardiologist: Harrington Challenger  Subjective   Just got extubated. Family in room. Ectopy on monitor. Couplets, occasional triplets. No CP.   Inpatient Medications    Scheduled Meds: . arformoterol  15 mcg Nebulization BID  . aspirin  300 mg Rectal Daily  . budesonide (PULMICORT) nebulizer solution  0.5 mg Nebulization BID  . carvedilol  3.125 mg Oral BID WC  . chlorhexidine gluconate (MEDLINE KIT)  15 mL Mouth Rinse BID  . Chlorhexidine Gluconate Cloth  6 each Topical Daily  . collagenase   Topical Daily  . feeding supplement (PRO-STAT SUGAR FREE 64)  30 mL Per Tube Daily  . feeding supplement (VITAL HIGH PROTEIN)  1,000 mL Per Tube Q24H  . furosemide  40 mg Intravenous Q6H  . heparin  5,000 Units Subcutaneous Q8H  . insulin aspart  2-6 Units Subcutaneous Q4H  . insulin glargine  35 Units Subcutaneous Q24H  . ipratropium-albuterol  3 mL Nebulization Q6H  . mouth rinse  15 mL Mouth Rinse 10 times per day  . [START ON 07/02/2017] predniSONE  30 mg Per Tube Q breakfast  . sodium chloride flush  10-40 mL Intracatheter Q12H   Continuous Infusions: . sodium chloride 10 mL/hr at 07/01/17 1000  . dexmedetomidine (PRECEDEX) IV infusion Stopped (07/01/17 1000)  . famotidine (PEPCID) IV 20 mg (07/01/17 1000)  . fentaNYL infusion INTRAVENOUS Stopped (07/01/17 1000)  . piperacillin-tazobactam (ZOSYN)  IV 3.375 g (07/01/17 0655)   PRN Meds: fentaNYL, hydrALAZINE, sodium chloride flush   Vital Signs    Vitals:   07/01/17 0803 07/01/17 0900 07/01/17 0935 07/01/17 1000  BP:  (!) 145/81  129/73  Pulse:  (!) 36  64  Resp:  18  17  Temp: 98.9 F (37.2 C)     TempSrc: Axillary     SpO2:  100% 98% 99%  Weight:      Height:        Intake/Output Summary (Last 24 hours) at 07/01/17 1038 Last data filed at 07/01/17 1000  Gross per 24 hour  Intake           2131.8 ml  Output             4650 ml  Net           -2518.2 ml   Filed Weights   06/29/17 0600 06/30/17 0200 07/01/17 0500  Weight: (!) 318 lb 4.5 oz (144.4 kg) (!) 310 lb 12.2 oz (141 kg) (!) 304 lb 10.8 oz (138.2 kg)    Telemetry    Couplets, triplets, rare stacked PVCs 4-5 duration. NSR - Personally Reviewed  ECG    SB 46 - Personally Reviewed  Physical Exam   GEN: No acute distress.   Neck: No JVD Cardiac: RRR with ectopy, no murmurs, rubs, or gallops.  Respiratory: Clear to auscultation bilaterally. GI: Soft, nontender, non-distended obese MS: Mild BLE edema, ace wraps; No deformity. Neuro:  Nonfocal  Psych: Normal affect, emotional  Labs    Chemistry Recent Labs Lab 06/26/17 1533  06/29/17 2045 06/30/17 0424 07/01/17 0435  NA 138  < > 137 137 135  K 3.1*  < > 3.8 3.8 3.7  CL 107  < > 108 105 102  CO2 19*  < > 21* 23 26  GLUCOSE 202*  < > 153* 161* 128*  BUN 22*  < > 31* 38* 44*  CREATININE 1.39*  < > 1.58* 1.62*  1.45*  CALCIUM 8.4*  < > 8.5* 8.6* 8.8*  PROT 7.9  --   --   --   --   ALBUMIN 3.2*  --   --   --   --   AST 168*  --   --   --   --   ALT 108*  --   --   --   --   ALKPHOS 71  --   --   --   --   BILITOT 0.7  --   --   --   --   GFRNONAA 42*  < > 36* 35* 40*  GFRAA 48*  < > 41* 40* 46*  ANIONGAP 12  < > '8 9 7  ' < > = values in this interval not displayed.   Hematology Recent Labs Lab 06/29/17 0345 06/30/17 0424 07/01/17 0435  WBC 13.3* 14.5* 12.0*  RBC 4.11 4.15 4.06  HGB 11.6* 12.1 11.4*  HCT 37.3 37.6 36.7  MCV 90.8 90.6 90.4  MCH 28.2 29.2 28.1  MCHC 31.1 32.2 31.1  RDW 15.2 14.9 14.7  PLT 219 215 216    Cardiac Enzymes Recent Labs Lab 06/26/17 2047 06/27/17 0429 06/27/17 0840  TROPONINI 0.09* 0.05* 0.04*    Recent Labs Lab 06/26/17 1539  TROPIPOC 0.01     BNPNo results for input(s): BNP, PROBNP in the last 168 hours.   DDimer No results for input(s): DDIMER in the last 168 hours.   Radiology    Dg Chest Port 1 View  Result Date: 07/01/2017 CLINICAL DATA:   Acute respiratory failure with hypoxia.  Intubated. EXAM: PORTABLE CHEST 1 VIEW COMPARISON:  Chest radiograph from one day prior. FINDINGS: Endotracheal tube tip is 3.3 cm above the carina. Enteric tube enters stomach with the tip not seen on this image. Left internal jugular central venous catheter terminates in the middle third of the superior vena cava. Stable cardiomediastinal silhouette with mild cardiomegaly. No pneumothorax. No pleural effusion. Patchy bibasilar consolidation, left greater than right, stable. No overt pulmonary edema. IMPRESSION: 1. Well-positioned support structures. 2. Stable mild cardiomegaly without overt pulmonary edema. 3. Stable patchy bibasilar lung consolidation, left greater than right. Electronically Signed   By: Ilona Sorrel M.D.   On: 07/01/2017 07:41   Dg Chest Port 1 View  Result Date: 06/30/2017 CLINICAL DATA:  Acute respiratory failure with hypoxia. EXAM: PORTABLE CHEST 1 VIEW COMPARISON:  06/29/2017 and 06/28/2017. FINDINGS: 0515 hours. Patient rotated to the left. Endotracheal tube tip is about 2.6 cm above the base of the carina. Left IJ central line tip is stable, projecting over the location of the mid SVC. Pulmonary vascular congestion again noted with retrocardiac left base collapse/ consolidation. The NG tube passes into the stomach although the distal tip position is not included on the film. Telemetry leads overlie the chest. IMPRESSION: 1. No substantial change in exam. 2. Low volume film with cardiomegaly and vascular congestion. 3. Similar appearance retrocardiac left base collapse/consolidation. Electronically Signed   By: Misty Stanley M.D.   On: 06/30/2017 07:19    Cardiac Studies   ECHO - Left ventricle: Diffuse hypokinesis worse in the inferior basal   wall The cavity size was moderately dilated. Wall thickness was   increased in a pattern of moderate LVH. Systolic function was   mildly to moderately reduced. The estimated ejection fraction was    in the range of 40% to 45%. Left ventricular diastolic function   parameters were normal. - Aortic valve:  There was mild regurgitation. - Mitral valve: There was moderate regurgitation. - Left atrium: The atrium was moderately dilated. - Atrial septum: No defect or patent foramen ovale was identified.  Patient Profile        56 y.o. female with past medical history of sarcoid, hypertension, hyperlipidemia, diastolic congestive heart failure, asthma admitted following cardiac arrest. Patient received 5-10 minutes of CPR following ventricular fibrillation arrest. She required one defibrillation to sinus rhythm. Echocardiogram shows ejection fraction 40-45%, moderate LVH, moderate LVE, mild aortic insufficiency, moderate mitral regurgitation and moderate left atrial enlargement.    Assessment & Plan    Cardiac arrest/ VF  - Unclear etiology. Sudden.   - Perhaps in mid week plan for cardiac cath, diagnostic  - Just extubated.   - Keep K >4 and Mag >2  - Occasional ectopy noted on monitor  - EF mildly to moderately reduced 40-45%  - Will increase coreg to 6.25 BID  Acute systolic HF  - Lasix IV  - out 4.5L so far  - Replete K  AKI  - Creat improved today from 1.65 to 1.45  Parox afib  - IV amio off on June 8  - NSR  Morbid obesity  - co morbidity  PVC's  - will monitor.  - increased coreg  - off IV amio  ARF  - extubated  Critical care time 35 min spent with patient, family, nursing team. Critically ill, post cardiac arrest.    Signed, Candee Furbish, MD  07/01/2017, 10:38 AM

## 2017-07-01 NOTE — Progress Notes (Signed)
Fentanyl 229ml flushed down sink witnessed by Berniece Salines, RN.

## 2017-07-01 NOTE — Progress Notes (Signed)
eLink Physician-Brief Progress Note Patient Name: Kari [name] DOB: [birth year] MRN: [id number]   Date of Service  07/01/2017  HPI/Events of Note  Patient c/o headache and requests Tylenol. AST and ALT both elevated, therefore, would avoid Tylenol. Creatinine = 1.4.  eICU Interventions  Will order: 1. Motrin 400 mg PO X 1.      Intervention Category Intermediate Interventions: Pain - evaluation and management  Larrisa Cravey [place] 07/01/2017, 8:12 PM

## 2017-07-02 ENCOUNTER — Inpatient Hospital Stay (HOSPITAL_COMMUNITY): Payer: Medicare Other

## 2017-07-02 LAB — GLUCOSE, CAPILLARY
Glucose-Capillary: 96 mg/dL (ref 65–99)
Glucose-Capillary: 65 mg/dL (ref 65–99)
Glucose-Capillary: 68 mg/dL (ref 65–99)
Glucose-Capillary: 76 mg/dL (ref 65–99)
Glucose-Capillary: 92 mg/dL (ref 65–99)
Glucose-Capillary: 81 mg/dL (ref 65–99)
Glucose-Capillary: 88 mg/dL (ref 65–99)

## 2017-07-02 LAB — BASIC METABOLIC PANEL
Anion gap: 7 (ref 5–15)
BUN: 37 mg/dL — ABNORMAL HIGH (ref 6–20)
CO2: 29 mmol/L (ref 22–32)
Calcium: 8.8 mg/dL — ABNORMAL LOW (ref 8.9–10.3)
Chloride: 101 mmol/L (ref 101–111)
Creatinine, Ser: 1.32 mg/dL — ABNORMAL HIGH (ref 0.44–1.00)
GFR calc Af Amer: 52 mL/min — ABNORMAL LOW (ref >60)
GFR calc non Af Amer: 44 mL/min — ABNORMAL LOW (ref >60)
Glucose, Bld: 78 mg/dL (ref 65–99)
Potassium: 3.8 mmol/L (ref 3.5–5.1)
Sodium: 137 mmol/L (ref 135–145)

## 2017-07-02 LAB — CBC WITH DIFFERENTIAL/PLATELET
Basophils Absolute: 0 10*3/uL (ref 0.0–0.1)
Basophils Relative: 0 %
Eosinophils Absolute: 0.2 10*3/uL (ref 0.0–0.7)
Eosinophils Relative: 1 %
HCT: 39.5 % (ref 36.0–46.0)
Hemoglobin: 12.2 g/dL (ref 12.0–15.0)
Lymphocytes Relative: 26 %
Lymphs Abs: 4.1 10*3/uL — ABNORMAL HIGH (ref 0.7–4.0)
MCH: 28.7 pg (ref 26.0–34.0)
MCHC: 30.9 g/dL (ref 30.0–36.0)
MCV: 92.9 fL (ref 78.0–100.0)
Monocytes Absolute: 1.3 10*3/uL — ABNORMAL HIGH (ref 0.1–1.0)
Monocytes Relative: 8 %
Neutro Abs: 10.3 10*3/uL — ABNORMAL HIGH (ref 1.7–7.7)
Neutrophils Relative %: 65 %
Platelets: 260 10*3/uL (ref 150–400)
RBC: 4.25 MIL/uL (ref 3.87–5.11)
RDW: 15.2 % (ref 11.5–15.5)
WBC: 15.9 10*3/uL — ABNORMAL HIGH (ref 4.0–10.5)

## 2017-07-02 LAB — PROCALCITONIN: Procalcitonin: <0.1 ng/mL

## 2017-07-02 MED ORDER — SODIUM CHLORIDE 0.9 % IV SOLN: INTRAVENOUS | Status: AC

## 2017-07-02 MED ORDER — INSULIN ASPART 100 UNIT/ML ~~LOC~~ SOLN
2.0000 [IU] | Freq: Three times a day (TID) | SUBCUTANEOUS | Status: DC
Start: 2017-07-02 — End: 2017-07-03

## 2017-07-02 MED ORDER — POTASSIUM CHLORIDE CRYS ER 20 MEQ PO TBCR
40.0000 meq | EXTENDED_RELEASE_TABLET | Freq: Once | ORAL | Status: AC
  Administered 2017-07-02: 40 meq via ORAL
  Filled 2017-07-02: qty 2

## 2017-07-02 MED ORDER — PREDNISONE 20 MG PO TABS
30.0000 mg | ORAL_TABLET | Freq: Every day | ORAL | Status: DC
  Administered 2017-07-02 – 2017-07-06 (×5): 30 mg via ORAL
  Filled 2017-07-02 (×5): qty 1

## 2017-07-02 MED ORDER — CARVEDILOL 12.5 MG PO TABS
12.5000 mg | ORAL_TABLET | Freq: Two times a day (BID) | ORAL | Status: DC
  Administered 2017-07-02 – 2017-07-06 (×6): 12.5 mg via ORAL
  Filled 2017-07-02 (×6): qty 1

## 2017-07-02 MED ORDER — FUROSEMIDE 10 MG/ML IJ SOLN
40.0000 mg | Freq: Four times a day (QID) | INTRAMUSCULAR | Status: AC
  Administered 2017-07-02 (×2): 40 mg via INTRAVENOUS
  Filled 2017-07-02 (×2): qty 4

## 2017-07-02 MED ORDER — ASPIRIN 81 MG PO CHEW
81.0000 mg | CHEWABLE_TABLET | Freq: Every day | ORAL | Status: DC
  Administered 2017-07-02 – 2017-07-06 (×5): 81 mg via ORAL
  Filled 2017-07-02 (×5): qty 1

## 2017-07-02 NOTE — Progress Notes (Signed)
Progress Note  Patient Name: Kari [name] Date of Encounter: 07/02/2017  Primary Cardiologist: Harrington Challenger  Subjective   Feels well, alert. Emotional. No CP, no SOB  Inpatient Medications    Scheduled Meds: . arformoterol  15 mcg Nebulization BID  . aspirin  81 mg Oral Daily  . budesonide (PULMICORT) nebulizer solution  0.5 mg Nebulization BID  . carvedilol  6.25 mg Oral BID WC  . Chlorhexidine Gluconate Cloth  6 each Topical Daily  . collagenase   Topical Daily  . heparin  5,000 Units Subcutaneous Q8H  . insulin aspart  2-6 Units Subcutaneous TID AC & HS  . insulin glargine  35 Units Subcutaneous Q24H  . mouth rinse  15 mL Mouth Rinse BID  . predniSONE  30 mg Oral Q breakfast  . sodium chloride flush  10-40 mL Intracatheter Q12H   Continuous Infusions: . sodium chloride 20 mL/hr at 07/01/17 1900   PRN Meds: hydrALAZINE, ipratropium-albuterol, sodium chloride flush   Vital Signs    Vitals:   07/02/17 1146 07/02/17 1200 07/02/17 1201 07/02/17 1300  BP:  119/67 (!) 119/57 123/79  Pulse: (!) 59 (!) 59 66 62  Resp: 19 19 20  (!) 21  Temp:   98.7 F (37.1 C)   TempSrc:   Oral   SpO2: 97% 96% 99% 100%  Weight:      Height:        Intake/Output Summary (Last 24 hours) at 07/02/17 1411 Last data filed at 07/02/17 1358  Gross per 24 hour  Intake             1135 ml  Output             7880 ml  Net            -6745 ml   Filed Weights   06/30/17 0200 07/01/17 0500 07/02/17 0457  Weight: (!) 310 lb 12.2 oz (141 kg) (!) 304 lb 10.8 oz (138.2 kg) 292 lb 11.2 oz (132.8 kg)    Telemetry    Couplets, triplets. NSR now - Personally Reviewed  ECG    SB 46 previously - Personally Reviewed  Physical Exam   GEN: Well nourished, well developed, in no acute distress  HEENT: normal  Neck: no JVD, carotid bruits, or masses Cardiac: RRR; no murmurs, rubs, or gallops,no edema rare ectopy Respiratory:  clear to auscultation bilaterally, normal work of breathing GI: soft,  nontender, nondistended, + BS, obese MS: no deformity or atrophy  Skin: warm and dry, no rash Neuro:  Alert, Strength and sensation are intact Psych: euthymic mood, full affect   Labs    Chemistry Recent Labs Lab 06/26/17 1533  06/30/17 0424 07/01/17 0435 07/01/17 1702 07/01/17 2056 07/02/17 0358  NA 138  < > 137 135 142 143 137  K 3.1*  < > 3.8 3.7 3.5 3.5 3.8  CL 107  < > 105 102 99* 102 101  CO2 19*  < > 23 26  --   --  29  GLUCOSE 202*  < > 161* 128* 101* 80 78  BUN 22*  < > 38* 44* 40* 39* 37*  CREATININE 1.39*  < > 1.62* 1.45* 1.40* 1.30* 1.32*  CALCIUM 8.4*  < > 8.6* 8.8*  --   --  8.8*  PROT 7.9  --   --   --   --   --   --   ALBUMIN 3.2*  --   --   --   --   --   --  AST 168*  --   --   --   --   --   --   ALT 108*  --   --   --   --   --   --   ALKPHOS 71  --   --   --   --   --   --   BILITOT 0.7  --   --   --   --   --   --   GFRNONAA 42*  < > 35* 40*  --   --  44*  GFRAA 48*  < > 40* 46*  --   --  52*  ANIONGAP 12  < > 9 7  --   --  7  < > = values in this interval not displayed.   Hematology  Recent Labs Lab 06/30/17 0424 07/01/17 0435 07/01/17 1702 07/01/17 2056 07/02/17 0358  WBC 14.5* 12.0*  --   --  15.9*  RBC 4.15 4.06  --   --  4.25  HGB 12.1 11.4* 14.6 13.6 12.2  HCT 37.6 36.7 43.0 40.0 39.5  MCV 90.6 90.4  --   --  92.9  MCH 29.2 28.1  --   --  28.7  MCHC 32.2 31.1  --   --  30.9  RDW 14.9 14.7  --   --  15.2  PLT 215 216  --   --  260    Cardiac Enzymes  Recent Labs Lab 06/26/17 2047 06/27/17 0429 06/27/17 0840  TROPONINI 0.09* 0.05* 0.04*     Recent Labs Lab 06/26/17 1539  TROPIPOC 0.01     BNPNo results for input(s): BNP, PROBNP in the last 168 hours.   DDimer No results for input(s): DDIMER in the last 168 hours.   Radiology    Dg Chest Port 1 View  Result Date: 07/02/2017 CLINICAL DATA:  Respiratory failure/hypoxia EXAM: PORTABLE CHEST 1 VIEW COMPARISON:  July 01, 2017 FINDINGS: Central catheter tip is in the  superior vena cava. Endotracheal tube and nasogastric tube have been removed. No evident pneumothorax. There has been interval clearing of patchy consolidation from the left base. Mild left base atelectasis remains. Mild right base atelectasis medially is also stable. No new opacity. Heart is mildly enlarged with pulmonary vascularity within normal limits. No adenopathy. No bone lesions. IMPRESSION: Central catheter tip in superior vena cava. No pneumothorax. Stable cardiomegaly. Bibasilar atelectasis with interval significant clearing on the left. No new opacity on either side. Electronically Signed   By: Lowella Grip III M.D.   On: 07/02/2017 08:06   Dg Chest Port 1 View  Result Date: 07/01/2017 CLINICAL DATA:  Acute respiratory failure with hypoxia.  Intubated. EXAM: PORTABLE CHEST 1 VIEW COMPARISON:  Chest radiograph from one day prior. FINDINGS: Endotracheal tube tip is 3.3 cm above the carina. Enteric tube enters stomach with the tip not seen on this image. Left internal jugular central venous catheter terminates in the middle third of the superior vena cava. Stable cardiomediastinal silhouette with mild cardiomegaly. No pneumothorax. No pleural effusion. Patchy bibasilar consolidation, left greater than right, stable. No overt pulmonary edema. IMPRESSION: 1. Well-positioned support structures. 2. Stable mild cardiomegaly without overt pulmonary edema. 3. Stable patchy bibasilar lung consolidation, left greater than right. Electronically Signed   By: Ilona Sorrel M.D.   On: 07/01/2017 07:41    Cardiac Studies   ECHO - Left ventricle: Diffuse hypokinesis worse in the inferior basal   wall The cavity size was  moderately dilated. Wall thickness was   increased in a pattern of moderate LVH. Systolic function was   mildly to moderately reduced. The estimated ejection fraction was   in the range of 40% to 45%. Left ventricular diastolic function   parameters were normal. - Aortic valve: There was  mild regurgitation. - Mitral valve: There was moderate regurgitation. - Left atrium: The atrium was moderately dilated. - Atrial septum: No defect or patent foramen ovale was identified.  Patient Profile        56 y.o. female with past medical history of sarcoid, hypertension, hyperlipidemia, diastolic congestive heart failure, asthma admitted following cardiac arrest. Patient received 5-10 minutes of CPR following ventricular fibrillation arrest. She required one defibrillation to sinus rhythm. Echocardiogram shows ejection fraction 40-45%, moderate LVH, moderate LVE, mild aortic insufficiency, moderate mitral regurgitation and moderate left atrial enlargement.    Assessment & Plan    Cardiac arrest/ VF  - Unclear etiology. Sudden.   - Plan for cardiac cath tomorrow as she continues to improve. Risks and benefits discussed including CVA, MI death, bleeding. She is willing to proceed. Family in room for discussion.   - Will get EP to see her tomorrow as well. ICD. Also consider cardiac MRI (sarcoid) prior to ICD.  - Keep K >4 and Mag >2  - Occasional ectopy noted on monitor again today  - EF mildly to moderately reduced 40-45%  - Will increase coreg to 12.5 BID  Acute systolic HF  - Lasix IV 40 would reduce from Q6 to BID continue  - out 13.8L so far  - Replete K  AKI  - Creat improved today from 1.65 to 1.45 to 1.32  Parox afib  - IV amio off on June 8  - NSR. Have not seen AFIB for the past 3 days.  Morbid obesity  - co morbidity  PVC's  - will monitor.  - increased coreg  - off IV amio  ARF  - extubated  Critical care time 35 min spent with patient, family, nursing team. Critically ill, post cardiac arrest.    Signed, Candee Furbish, MD  07/02/2017, 2:11 PM

## 2017-07-02 NOTE — Progress Notes (Signed)
Report called to 3 east RN.

## 2017-07-02 NOTE — Progress Notes (Signed)
Received patient from [place], alert and oriented, agree with previous Rn's assessment.

## 2017-07-02 NOTE — Progress Notes (Signed)
PULMONARY / CRITICAL CARE MEDICINE   Name: Kari [name] MRN: [id number] DOB: [birth year]    ADMISSION DATE:  06/26/2017 CONSULTATION DATE:  06/26/17  REFERRING MD:  Dr. Eulis Foster  CHIEF COMPLAINT:  Cardiac Arrest  BRIEF SUMMARY: 56 year old female admitted after witnessed cardiac arrest.  PMH significant for asthma, heart failure (11/2016 EF 55-60%) , HTN, Questionable sarcoidosis (based on mediastinal lymphadenopathy no biopsy), HLD, obesity, chronic BLE ulcers, large cystic ovarian mass, headache and depression.  CT head negative.  EEG with diffuse slowing.   SUBJECTIVE:  Extubated yesterday. Tolerating well. Tired this morning. No other complaints.   VITAL SIGNS: BP (!) 87/51   Pulse 65   Temp 99 F (37.2 C) (Oral)   Resp 17   Ht 5\' 5"  (1.651 m)   Wt 132.8 kg (292 lb 11.2 oz)   LMP 11/21/2011   SpO2 96%   BMI 48.71 kg/m   HEMODYNAMICS: CVP:  [9 mmHg-13 mmHg] 13 mmHg  VENTILATOR SETTINGS: FiO2 (%):  [30 %-96 %] 96 % PEEP:  [5 cmH20] 5 cmH20 Pressure Support:  [5 cmH20] 5 cmH20  INTAKE / OUTPUT: I/O last 3 completed shifts: In: 2207.1 [I.V.:717.1; NG/GT:740; IV Piggyback:750] Out: 7780 [Urine:7780]  PHYSICAL EXAMINATION: General: obese female in NAD resting in bed HENT: [place]/AT, PERRL, no appreciable JVD PULM: Diminished, but sound clear. No distress. Unlabored CV: RRR, no MRG GI: Soft, non-tender, non-distended MSK: No acute deformity Derm: ace wrap ankles, chronic venous stasis ulcers Neuro: Awake, alert, oriented    LABS:  BMET  Recent Labs Lab 06/30/17 0424 07/01/17 0435 07/01/17 1702 07/01/17 2056 07/02/17 0358  NA 137 135 142 143 137  K 3.8 3.7 3.5 3.5 3.8  CL 105 102 99* 102 101  CO2 23 26  --   --  29  BUN 38* 44* 40* 39* 37*  CREATININE 1.62* 1.45* 1.40* 1.30* 1.32*  GLUCOSE 161* 128* 101* 80 78   Electrolytes  Recent Labs Lab 06/30/17 0424  06/30/17 1656 07/01/17 0435 07/01/17 1659 07/02/17 0358  CALCIUM 8.6*  --   --  8.8*  --   8.8*  MG  --   < > 2.2 2.2 2.4  --   PHOS  --   < > 3.9 2.8 2.7  --   < > = values in this interval not displayed.  CBC  Recent Labs Lab 06/30/17 0424 07/01/17 0435 07/01/17 1702 07/01/17 2056 07/02/17 0358  WBC 14.5* 12.0*  --   --  15.9*  HGB 12.1 11.4* 14.6 13.6 12.2  HCT 37.6 36.7 43.0 40.0 39.5  PLT 215 216  --   --  260    Coag's  Recent Labs Lab 06/26/17 2047 06/27/17 0429  APTT 31 30  INR 1.11 1.15   Sepsis Markers  Recent Labs Lab 06/26/17 1541 06/26/17 2047 06/27/17 0429 06/28/17 0157  LATICACIDVEN 6.91* 3.0*  --   --   PROCALCITON  --  0.19 0.32 0.33   ABG  Recent Labs Lab 06/27/17 0806 06/28/17 0346 06/29/17 0340  PHART 7.265* 7.482* 7.412  PCO2ART 36.5 26.9* 33.9  PO2ART 145.0* 120* 105   Liver Enzymes  Recent Labs Lab 06/26/17 1533  AST 168*  ALT 108*  ALKPHOS 71  BILITOT 0.7  ALBUMIN 3.2*   Cardiac Enzymes  Recent Labs Lab 06/26/17 2047 06/27/17 0429 06/27/17 0840  TROPONINI 0.09* 0.05* 0.04*   Glucose  Recent Labs Lab 07/01/17 0757 07/01/17 1149 07/01/17 1650 07/01/17 2036 07/01/17 2313 07/02/17 0417  GLUCAP 107* 98 83 85 88 96   Imaging No results found.   STUDIES:  11/2016 TTE >> LVEF 55-60%, G1DD,  Mild AR, mild MR, mildly dilated LA, PAP 47 mm Hg EKG 7/4 >> ST 117, diffuse STD CXR 7/4 >> ETT at clavicular heads, OGT in stomach, chronic cardiomegaly, low volume chest with perihilar atelectasis and vascular congestion TTE 7/5 >> Increased wall thickening, EF 40-45% with some valvular concerns as above EEG 7/5 >> diffuse slowness, no seizure activity, non-specific  CULTURES: BCx2 7/4 >> Sputum 7/5 >> abundant WBC >>OPF  ANTIBIOTICS: Vanc 7/5 >> 7/7 Zosyn 7/5 >> 7/9  SIGNIFICANT EVENTS: 7/04 Admit Vfib arrest 7/06 Following some commands 7/07 Weaned 2.5 hours on PSV 7/9 extuabted  LINES/TUBES: 7/4 ETT > 7/9 7/4 R femoral a-line > 7/8 7/4 OGT >> 7/4 foley >> 7/4 L IJ TLC  >>  DISCUSSION: 56DDU with diastolic heart failure, obesity, HTN, Asthma, Sarcoidosis, with witnessed VF arrest.  5-10 mins of CPR, 1 epi, and 1 defib given prior to ROSC.  Patient unresponsive following and therefore TTM started. Unknown of any patient complaints prior to arrest.  Rewarmed, alert on 7/7, weaned 2.5 hours.  Extubated 7/9 and tolerated well.   ASSESSMENT / PLAN:  PULMONARY A: Acute Hypoxic Respiratory Failure - in setting of pulmonary edema Acute Hypercapneic respiratory failure Aspiration pneumonia> improving Asthma exacerbation? - reportedly was asymptomatic prior to the arrest, so do not feel this arrest was caused by an asthma exacerbation. However, wheezing on presentation. Sarcoidosis> details of diagnosis uncertain P: Keep SpO2 > 90% Continue brovana/pulmicort PRN duoneb Prednisone taper to 30mg  7/10 Incentive spirometry Need to find pathology to confirm sarcoidosis diagnosis  CARDIOVASCULAR A: Vfib Cardiac Arrest - estimated  5-10 min of CPR prior to ROSC.  Unclear if this arrest was due to coronary ischemia vs a primary arrhythmia; unclear the history of her sarcoidosis and if she could've had any cardiac involvement which would predispose to arrhythmia.  Acute CHF with history of diastolic HF (20/25 EF 42-70%) HTN HLD  ? Cardiac sarcoidosis  P: Telemetry monitoring Cardiology to plan timing of LHC, probably for mid-week. 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  HEMATOLOGIC A: Anemia P: Monitor for bleeding  INFECTIOUS A: Aspiration PNA > improving BLE Venous Ulcers WBC rise 7/10 P: Wound care Off ABX Assess PCT Trend WBC  ENDOCRINE  A: Hyperglycemia without DM  history P: SSI to AC/HS Continue lantus  NEUROLOGIC A: Anoxic metabolic Encephalopathy - improved P: PT eval requested  GU A: Large cystic ovarian mass - diagnosed 03/2017, surgery postponed due to acute issues with AKI, hypokalemia at the time, unclear if malignant vs benign P: Outpatient work up post discharge   FAMILY  - Updates: sister updated again 7/10  - Inter-disciplinary family meet or Palliative Care meeting due by:  07/03/17  Will plan for transfer to tele and [place] for 7/11  My CC time 30 mins  Georgann Housekeeper, AGACNP-BC [facility] Pulmonology/Critical Care Pager [phone] or [phone]  07/02/2017 8:23 AM

## 2017-07-02 NOTE — Progress Notes (Signed)
Patient transferred to [address]12 by way of bed. VSS.  Lucius Conn, RN

## 2017-07-02 NOTE — Progress Notes (Signed)
Nurse called to clarify diet order - PCCM had resumed diet this AM but when patient was transferred to floor, old NPO order from 06/26/17 from EDP released, erasing prior order. Nurse denies any new issues with feeding- Will resume diet as ordered this AM. Melina Copa PA-C

## 2017-07-02 NOTE — Progress Notes (Signed)
Paged MD regarding pt NPO diet order. MD Dunn stated she will change order for heart healthy   Kari [name]

## 2017-07-03 ENCOUNTER — Inpatient Hospital Stay (HOSPITAL_COMMUNITY): Payer: Medicare Other

## 2017-07-03 ENCOUNTER — Encounter (HOSPITAL_COMMUNITY)
Admission: EM | Disposition: A | Discharge status: Home Health | Payer: Self-pay | Source: Home / Self Care | Attending: Pulmonary Disease

## 2017-07-03 DIAGNOSIS — N179 Acute kidney failure, unspecified: Secondary | ICD-10-CM

## 2017-07-03 HISTORY — PX: LEFT HEART CATH AND CORONARY ANGIOGRAPHY: CATH118249

## 2017-07-03 LAB — BASIC METABOLIC PANEL
Anion gap: 8 (ref 5–15)
BUN: 38 mg/dL — ABNORMAL HIGH (ref 6–20)
CO2: 30 mmol/L (ref 22–32)
Calcium: 9.3 mg/dL (ref 8.9–10.3)
Chloride: 100 mmol/L — ABNORMAL LOW (ref 101–111)
Creatinine, Ser: 1.37 mg/dL — ABNORMAL HIGH (ref 0.44–1.00)
GFR calc Af Amer: 49 mL/min — ABNORMAL LOW (ref >60)
GFR calc non Af Amer: 43 mL/min — ABNORMAL LOW (ref >60)
Glucose, Bld: 77 mg/dL (ref 65–99)
Potassium: 3.8 mmol/L (ref 3.5–5.1)
Sodium: 138 mmol/L (ref 135–145)

## 2017-07-03 LAB — GLUCOSE, CAPILLARY
Glucose-Capillary: 104 mg/dL — ABNORMAL HIGH (ref 65–99)
Glucose-Capillary: 110 mg/dL — ABNORMAL HIGH (ref 65–99)
Glucose-Capillary: 63 mg/dL — ABNORMAL LOW (ref 65–99)
Glucose-Capillary: 71 mg/dL (ref 65–99)
Glucose-Capillary: 81 mg/dL (ref 65–99)
Glucose-Capillary: 81 mg/dL (ref 65–99)
Glucose-Capillary: 88 mg/dL (ref 65–99)

## 2017-07-03 LAB — CBC
HCT: 40.4 % (ref 36.0–46.0)
Hemoglobin: 12.6 g/dL (ref 12.0–15.0)
MCH: 28.7 pg (ref 26.0–34.0)
MCHC: 31.2 g/dL (ref 30.0–36.0)
MCV: 92 fL (ref 78.0–100.0)
Platelets: 259 10*3/uL (ref 150–400)
RBC: 4.39 MIL/uL (ref 3.87–5.11)
RDW: 14.7 % (ref 11.5–15.5)
WBC: 14.2 10*3/uL — ABNORMAL HIGH (ref 4.0–10.5)

## 2017-07-03 LAB — PROTIME-INR
INR: 1.19
Prothrombin Time: 15.2 seconds (ref 11.4–15.2)

## 2017-07-03 SURGERY — LEFT HEART CATH AND CORONARY ANGIOGRAPHY
Anesthesia: LOCAL

## 2017-07-03 MED ORDER — ONDANSETRON HCL 4 MG/2ML IJ SOLN: 4.0000 mg | Freq: Four times a day (QID) | INTRAMUSCULAR | Status: DC | PRN

## 2017-07-03 MED ORDER — SODIUM CHLORIDE 0.9% FLUSH
3.0000 mL | Freq: Two times a day (BID) | INTRAVENOUS | Status: DC
  Administered 2017-07-03 (×2): 3 mL via INTRAVENOUS

## 2017-07-03 MED ORDER — FENTANYL CITRATE (PF) 100 MCG/2ML IJ SOLN
INTRAMUSCULAR | Status: DC | PRN
  Administered 2017-07-03 (×3): 50 ug via INTRAVENOUS

## 2017-07-03 MED ORDER — MIDAZOLAM HCL 2 MG/2ML IJ SOLN
INTRAMUSCULAR | Status: DC | PRN
  Administered 2017-07-03 (×2): 1 mg via INTRAVENOUS

## 2017-07-03 MED ORDER — ACETAMINOPHEN 325 MG PO TABS: 650.0000 mg | ORAL_TABLET | ORAL | Status: DC | PRN

## 2017-07-03 MED ORDER — SODIUM CHLORIDE 0.9 % IV SOLN: INTRAVENOUS | Status: DC

## 2017-07-03 MED ORDER — SODIUM CHLORIDE 0.9 % IV SOLN: 250.0000 mL | INTRAVENOUS | Status: DC | PRN

## 2017-07-03 MED ORDER — MIDAZOLAM HCL 2 MG/2ML IJ SOLN
INTRAMUSCULAR | Status: AC
  Filled 2017-07-03: qty 2

## 2017-07-03 MED ORDER — SODIUM CHLORIDE 0.9% FLUSH: 3.0000 mL | INTRAVENOUS | Status: DC | PRN

## 2017-07-03 MED ORDER — DEXTROSE-NACL 5-0.9 % IV SOLN
INTRAVENOUS | Status: DC
  Administered 2017-07-03: 13:00:00 via INTRAVENOUS
  Filled 2017-07-03: qty 1000

## 2017-07-03 MED ORDER — FENTANYL CITRATE (PF) 100 MCG/2ML IJ SOLN
INTRAMUSCULAR | Status: AC
  Filled 2017-07-03: qty 2

## 2017-07-03 MED ORDER — IOPAMIDOL (ISOVUE-370) INJECTION 76%
INTRAVENOUS | Status: AC
  Filled 2017-07-03: qty 50

## 2017-07-03 MED ORDER — OXYCODONE-ACETAMINOPHEN 5-325 MG PO TABS
1.0000 | ORAL_TABLET | ORAL | Status: DC | PRN
  Administered 2017-07-04 – 2017-07-06 (×2): 1 via ORAL
  Filled 2017-07-03: qty 2
  Filled 2017-07-03: qty 1

## 2017-07-03 MED ORDER — VERAPAMIL HCL 2.5 MG/ML IV SOLN
INTRAVENOUS | Status: DC | PRN
  Administered 2017-07-03: 10 mL via INTRA_ARTERIAL

## 2017-07-03 MED ORDER — HEPARIN SODIUM (PORCINE) 1000 UNIT/ML IJ SOLN
INTRAMUSCULAR | Status: DC | PRN
  Administered 2017-07-03: 6000 [IU] via INTRAVENOUS

## 2017-07-03 MED ORDER — ASPIRIN 81 MG PO CHEW: 81.0000 mg | CHEWABLE_TABLET | ORAL | Status: DC

## 2017-07-03 MED ORDER — IOPAMIDOL (ISOVUE-370) INJECTION 76%
INTRAVENOUS | Status: AC
  Filled 2017-07-03: qty 100

## 2017-07-03 MED ORDER — SODIUM CHLORIDE 0.9 % IV SOLN
INTRAVENOUS | Status: DC
  Administered 2017-07-03: 05:00:00 via INTRAVENOUS

## 2017-07-03 MED ORDER — IOPAMIDOL (ISOVUE-370) INJECTION 76%
INTRAVENOUS | Status: DC | PRN
  Administered 2017-07-03: 110 mL via INTRA_ARTERIAL

## 2017-07-03 MED ORDER — LIDOCAINE HCL (PF) 1 % IJ SOLN
INTRAMUSCULAR | Status: AC
  Filled 2017-07-03: qty 30

## 2017-07-03 MED ORDER — DEXTROSE 50 % IV SOLN
INTRAVENOUS | Status: AC
  Administered 2017-07-03: 09:00:00
  Filled 2017-07-03: qty 50

## 2017-07-03 MED ORDER — SODIUM CHLORIDE 0.9% FLUSH
3.0000 mL | Freq: Two times a day (BID) | INTRAVENOUS | Status: DC
  Administered 2017-07-05 – 2017-07-06 (×4): 3 mL via INTRAVENOUS

## 2017-07-03 MED ORDER — HEPARIN (PORCINE) IN NACL 2-0.9 UNIT/ML-% IJ SOLN
INTRAMUSCULAR | Status: AC
  Filled 2017-07-03: qty 1000

## 2017-07-03 MED ORDER — LIDOCAINE HCL (PF) 1 % IJ SOLN
INTRAMUSCULAR | Status: DC | PRN
  Administered 2017-07-03: 2 mL

## 2017-07-03 MED ORDER — ASPIRIN 81 MG PO CHEW
81.0000 mg | CHEWABLE_TABLET | ORAL | Status: AC
  Administered 2017-07-03: 81 mg via ORAL
  Filled 2017-07-03: qty 1

## 2017-07-03 MED ORDER — HEPARIN SODIUM (PORCINE) 1000 UNIT/ML IJ SOLN
INTRAMUSCULAR | Status: AC
  Filled 2017-07-03: qty 1

## 2017-07-03 MED ORDER — VERAPAMIL HCL 2.5 MG/ML IV SOLN
INTRAVENOUS | Status: AC
  Filled 2017-07-03: qty 2

## 2017-07-03 MED ORDER — ENOXAPARIN SODIUM 60 MG/0.6ML ~~LOC~~ SOLN
60.0000 mg | SUBCUTANEOUS | Status: DC
  Administered 2017-07-04: 11:00:00 60 mg via SUBCUTANEOUS
  Filled 2017-07-03: qty 0.6

## 2017-07-03 MED ORDER — HEPARIN (PORCINE) IN NACL 2-0.9 UNIT/ML-% IJ SOLN
INTRAMUSCULAR | Status: AC | PRN
  Administered 2017-07-03: 1000 mL

## 2017-07-03 SURGICAL SUPPLY — 14 items
CATH EXPO 5F FL3.5 (CATHETERS) ×2 IMPLANT
CATH EXPO 5FR FR4 (CATHETERS) ×2 IMPLANT
COVER PRB 48X5XTLSCP FOLD TPE (BAG) ×1 IMPLANT
COVER PROBE 5X48 (BAG) ×1
DEVICE RAD COMP TR BAND LRG (VASCULAR PRODUCTS) ×2 IMPLANT
GLIDESHEATH SLEND A-KIT 6F 22G (SHEATH) ×2 IMPLANT
GUIDEWIRE INQWIRE 1.5J.035X260 (WIRE) ×1 IMPLANT
HOVERMATT SINGLE USE (MISCELLANEOUS) ×2 IMPLANT
INQWIRE 1.5J .035X260CM (WIRE) ×2
KIT HEART LEFT (KITS) ×2 IMPLANT
PACK CARDIAC CATHETERIZATION (CUSTOM PROCEDURE TRAY) ×2 IMPLANT
TRANSDUCER W/STOPCOCK (MISCELLANEOUS) ×2 IMPLANT
TUBING CIL FLEX 10 FLL-RA (TUBING) ×2 IMPLANT
WIRE HI TORQ VERSACORE-J 145CM (WIRE) ×2 IMPLANT

## 2017-07-03 NOTE — Progress Notes (Signed)
[facility]  Patient Status: Active (receiving services up to time of hospitalization)  [facility] is providing the following services: RN  If patient discharges after hours, please call [phone].   Kari [name] 07/03/2017, 11:58 AM

## 2017-07-03 NOTE — Care Management Important Message (Signed)
Important Message  Patient Details  Name: Kari [name] MRN: [id number] Date of Birth: 10/15/1961   Medicare Important Message Given:  Yes    Aleksa Collinsworth Montine Circle 07/03/2017, 1:36 PM

## 2017-07-03 NOTE — Progress Notes (Signed)
[facility] TEAM 1 - Stepdown/ICU TEAM  Kari [name]  SNK:[id number] DOB: 05-30-1961 DOA: 06/26/2017 PCP: Rosita Fire, MD    Brief Narrative:  56 year old female admitted after witnessed cardiac arrest.  She has a medical history of asthma, heart failure (11/2016 EF 55-60%) , HTN, Questionable sarcoidosis (based on mediastinal lymphadenopathy - no biopsy), HLD, obesity, chronic BLE ulcers, large cystic ovarian mass, headache and depression.  CT head was negative.  EEG with diffuse slowing.   Significant Events: 7/4 Admit in Vfib arrest - intubated  7/5 TTE - EF 40-45%  / EEG > diffuse slowing  7/6 Following some commands 7/7 Weaned 2.5 hours on PSV 7/9 extubated 7/11 TRH assumed care  Subjective: Pt is resting comfortably in bed.  She denies cp, n/v, abdom pain, or sob.  She tells me she is hungry and anxious to get her cath done so she can eat.    Assessment & Plan:  Cardiac arrest 5-10 mins of CPR before ROSC - Cards following - for cardiac cath today   Acute hypoxic respiratory failure - acute pulmonary edema  Much improved - pt no longer requiring supplemental O2   Aspiration PNA Has completed a course of empiric abx tx - no persisting sx to suggest active pulm infection   Acute systolic CHF post arrest Care per Cardiology - appears euvolemic at this time   Acute kidney injury Creatinine stable at this time   Recent Labs Lab 07/01/17 0435 07/01/17 1702 07/01/17 2056 07/02/17 0358 07/03/17 0428  CREATININE 1.45* 1.40* 1.30* 1.32* 1.37*    Sarcoidosis  No evidence of acute flair at present  HTN BP currently well controlled  HLD  Morbid Obesity - Body mass index is 45.08 kg/m.   Chronic B LE venous ulcers  Large cystic ovarian mass diagnosed 03/2017 - surgery postponed due to AKI   DVT prophylaxis: SQ heparin  Code Status: FULL CODE Family Communication: no family present at time of exam  Disposition Plan:   Consultants:  [facility]    Antimicrobials:  None presently    Objective: Blood pressure 111/66, pulse 67, temperature 98.6 F (37 C), temperature source Oral, resp. rate 18, height 5\' 5"  (1.651 m), weight 122.9 kg (270 lb 14.4 oz), last menstrual period 11/21/2011, SpO2 97 %.  Intake/Output Summary (Last 24 hours) at 07/03/17 1445 Last data filed at 07/03/17 1402  Gross per 24 hour  Intake              240 ml  Output             3550 ml  Net            -3310 ml   Filed Weights   07/01/17 0500 07/02/17 0457 07/03/17 0404  Weight: (!) 138.2 kg (304 lb 10.8 oz) 132.8 kg (292 lb 11.2 oz) 122.9 kg (270 lb 14.4 oz)    Examination: General: No acute respiratory distress Lungs: Clear to auscultation bilaterally without wheezes or crackles Cardiovascular: Regular rate and rhythm without murmur gallop or rub normal S1 and S2 Abdomen: Nontender, obese, soft, bowel sounds positive, no rebound, no ascites, no appreciable mass Extremities: No significant cyanosis, clubbing, or edema bilateral lower extremities  CBC:  Recent Labs Lab 06/26/17 1533  06/29/17 0345 06/30/17 0424 07/01/17 0435 07/01/17 1702 07/01/17 2056 07/02/17 0358 07/03/17 0428  WBC 8.5  < > 13.3* 14.5* 12.0*  --   --  15.9* 14.2*  NEUTROABS 3.0  --   --   --   --   --   --  10.3*  --   HGB 11.2*  < > 11.6* 12.1 11.4* 14.6 13.6 12.2 12.6  HCT 35.4*  < > 37.3 37.6 36.7 43.0 40.0 39.5 40.4  MCV 93.2  < > 90.8 90.6 90.4  --   --  92.9 92.0  PLT 259  < > 219 215 216  --   --  260 259  < > = values in this interval not displayed. Basic Metabolic Panel:  Recent Labs Lab 06/29/17 1345 06/29/17 2045 06/30/17 0424 06/30/17 0430 06/30/17 1656 07/01/17 0435 07/01/17 1659 07/01/17 1702 07/01/17 2056 07/02/17 0358 07/03/17 0428  NA  --  137 137  --   --  135  --  142 143 137 138  K  --  3.8 3.8  --   --  3.7  --  3.5 3.5 3.8 3.8  CL  --  108 105  --   --  102  --  99* 102 101 100*  CO2  --  21* 23  --   --  26  --   --   --  29 30    GLUCOSE  --  153* 161*  --   --  128*  --  101* 80 78 77  BUN  --  31* 38*  --   --  44*  --  40* 39* 37* 38*  CREATININE  --  1.58* 1.62*  --   --  1.45*  --  1.40* 1.30* 1.32* 1.37*  CALCIUM  --  8.5* 8.6*  --   --  8.8*  --   --   --  8.8* 9.3  MG 2.2  --   --  2.0 2.2 2.2 2.4  --   --   --   --   PHOS 3.8  --   --  3.2 3.9 2.8 2.7  --   --   --   --    GFR: Estimated Creatinine Clearance: 61.1 mL/min (A) (by C-G formula based on SCr of 1.37 mg/dL (H)).  Liver Function Tests:  Recent Labs Lab 06/26/17 1533  AST 168*  ALT 108*  ALKPHOS 71  BILITOT 0.7  PROT 7.9  ALBUMIN 3.2*    Coagulation Profile:  Recent Labs Lab 06/26/17 2047 06/27/17 0429 07/03/17 0428  INR 1.11 1.15 1.19    Cardiac Enzymes:  Recent Labs Lab 06/26/17 2047 06/27/17 0429 06/27/17 0840  TROPONINI 0.09* 0.05* 0.04*    HbA1C: Hgb A1c MFr Bld  Date/Time Value Ref Range Status  12/17/2016 08:55 PM 5.6 4.8 - 5.6 % Final    Comment:    (NOTE)         Pre-diabetes: 5.7 - 6.4         Diabetes: >6.4         Glycemic control for adults with diabetes: <7.0   03/12/2014 06:57 PM 5.5 <5.7 % Final    Comment:    (NOTE)                                                                       According to the ADA Clinical Practice Recommendations for 2011, when HbA1c is used as a screening test:  >=6.5%   Diagnostic of Diabetes Mellitus           (  if abnormal result is confirmed) 5.7-6.4%   Increased risk of developing Diabetes Mellitus References:Diagnosis and Classification of Diabetes Mellitus,Diabetes WGYK,5993,57(SVXBL 1):S62-S69 and Standards of Medical Care in         Diabetes - 2011,Diabetes TJQZ,0092,33 (Suppl 1):S11-S61.    CBG:  Recent Labs Lab 07/03/17 0126 07/03/17 0401 07/03/17 0747 07/03/17 0843 07/03/17 1124  GLUCAP 88 81 63* 110* 71    Recent Results (from the past 240 hour(s))  MRSA PCR Screening     Status: None   Collection Time: 06/26/17  8:30 PM  Result Value  Ref Range Status   MRSA by PCR NEGATIVE NEGATIVE Final    Comment:        The GeneXpert MRSA Assay (FDA approved for NASAL specimens only), is one component of a comprehensive MRSA colonization surveillance program. It is not intended to diagnose MRSA infection nor to guide or monitor treatment for MRSA infections.   Culture, blood (routine x 2)     Status: None   Collection Time: 06/26/17  8:47 PM  Result Value Ref Range Status   Specimen Description BLOOD RIGHT ANTECUBITAL  Final   Special Requests   Final    BOTTLES DRAWN AEROBIC ONLY Blood Culture adequate volume   Culture NO GROWTH 5 DAYS  Final   Report Status 07/01/2017 FINAL  Final  Culture, blood (routine x 2)     Status: None   Collection Time: 06/26/17  8:47 PM  Result Value Ref Range Status   Specimen Description BLOOD RIGHT HAND  Final   Special Requests   Final    BOTTLES DRAWN AEROBIC ONLY Blood Culture adequate volume   Culture NO GROWTH 5 DAYS  Final   Report Status 07/01/2017 FINAL  Final  Culture, respiratory (NON-Expectorated)     Status: None   Collection Time: 06/27/17 11:20 AM  Result Value Ref Range Status   Specimen Description TRACHEAL ASPIRATE  Final   Special Requests NONE  Final   Gram Stain   Final    ABUNDANT WBC PRESENT,BOTH PMN AND MONONUCLEAR RARE GRAM NEGATIVE RODS RARE GRAM NEGATIVE COCCI IN PAIRS    Culture Consistent with normal respiratory flora.  Final   Report Status 06/29/2017 FINAL  Final     Scheduled Meds: . arformoterol  15 mcg Nebulization BID  . aspirin  81 mg Oral Daily  . budesonide (PULMICORT) nebulizer solution  0.5 mg Nebulization BID  . carvedilol  12.5 mg Oral BID WC  . Chlorhexidine Gluconate Cloth  6 each Topical Daily  . collagenase   Topical Daily  . heparin  5,000 Units Subcutaneous Q8H  . insulin aspart  2-6 Units Subcutaneous TID AC & HS  . insulin glargine  35 Units Subcutaneous Q24H  . mouth rinse  15 mL Mouth Rinse BID  . predniSONE  30 mg Oral Q  breakfast  . sodium chloride flush  10-40 mL Intracatheter Q12H  . sodium chloride flush  3 mL Intravenous Q12H     LOS: 7 days   Cherene Altes, MD [facility] Office  [phone] Pager - Text Page per Shea Evans as per below:  On-Call/Text Page:      [email or web address]      password [email or web address]  If 7PM-7AM, please contact night-coverage [email or web address] Password [email or web address] 07/03/2017, 2:45 PM

## 2017-07-03 NOTE — H&P (View-Only) (Signed)
Progress Note  Patient Name: Kari [name] Date of Encounter: 07/03/2017  Primary Cardiologist: Dr. Harrington Challenger   Subjective   Resting comfortably. No complaints. Denies CP and dyspnea. Family currently by bedside.   Inpatient Medications    Scheduled Meds: . arformoterol  15 mcg Nebulization BID  . aspirin  81 mg Oral Daily  . budesonide (PULMICORT) nebulizer solution  0.5 mg Nebulization BID  . carvedilol  12.5 mg Oral BID WC  . Chlorhexidine Gluconate Cloth  6 each Topical Daily  . collagenase   Topical Daily  . heparin  5,000 Units Subcutaneous Q8H  . insulin aspart  2-6 Units Subcutaneous TID AC & HS  . insulin glargine  35 Units Subcutaneous Q24H  . mouth rinse  15 mL Mouth Rinse BID  . predniSONE  30 mg Oral Q breakfast  . sodium chloride flush  10-40 mL Intracatheter Q12H  . sodium chloride flush  3 mL Intravenous Q12H   Continuous Infusions: . sodium chloride    . sodium chloride 20 mL/hr at 07/01/17 1900  . sodium chloride    . sodium chloride 10 mL/hr at 07/03/17 0444  . dextrose 5 % and 0.9% NaCl     PRN Meds: sodium chloride, hydrALAZINE, ipratropium-albuterol, sodium chloride flush, sodium chloride flush   Vital Signs    Vitals:   07/02/17 2006 07/02/17 2228 07/03/17 0059 07/03/17 0404  BP: 135/70  120/71 111/66  Pulse: 63  67 67  Resp: 18  18 18   Temp: 98.5 F (36.9 C)  98.6 F (37 C) 98.6 F (37 C)  TempSrc: Oral  Oral Oral  SpO2: 98% 96% 97% 97%  Weight:    270 lb 14.4 oz (122.9 kg)  Height:        Intake/Output Summary (Last 24 hours) at 07/03/17 1051 Last data filed at 07/03/17 0807  Gross per 24 hour  Intake              350 ml  Output             6000 ml  Net            -5650 ml   Filed Weights   07/01/17 0500 07/02/17 0457 07/03/17 0404  Weight: (!) 304 lb 10.8 oz (138.2 kg) 292 lb 11.2 oz (132.8 kg) 270 lb 14.4 oz (122.9 kg)    Telemetry    NSR with PVCs and 6 beat run of NSVT on telemetry- Personally Reviewed  ECG    NSR  with PVCs 06/27/17 - Personally Reviewed  Physical Exam   GEN: No acute distress. Morbidly obese  Neck: No JVD Cardiac: RRR, no murmurs, rubs, or gallops.  Respiratory: Clear to auscultation bilaterally. GI: Soft, nontender, non-distended  MS: chronic venous stasis dermatitis, both legs wrapped with ACE bandages Neuro:  Nonfocal  Psych: Normal affect   Labs    Chemistry Recent Labs Lab 06/26/17 1533  07/01/17 0435  07/01/17 2056 07/02/17 0358 07/03/17 0428  NA 138  < > 135  < > 143 137 138  K 3.1*  < > 3.7  < > 3.5 3.8 3.8  CL 107  < > 102  < > 102 101 100*  CO2 19*  < > 26  --   --  29 30  GLUCOSE 202*  < > 128*  < > 80 78 77  BUN 22*  < > 44*  < > 39* 37* 38*  CREATININE 1.39*  < > 1.45*  < >  1.30* 1.32* 1.37*  CALCIUM 8.4*  < > 8.8*  --   --  8.8* 9.3  PROT 7.9  --   --   --   --   --   --   ALBUMIN 3.2*  --   --   --   --   --   --   AST 168*  --   --   --   --   --   --   ALT 108*  --   --   --   --   --   --   ALKPHOS 71  --   --   --   --   --   --   BILITOT 0.7  --   --   --   --   --   --   GFRNONAA 42*  < > 40*  --   --  44* 43*  GFRAA 48*  < > 46*  --   --  52* 49*  ANIONGAP 12  < > 7  --   --  7 8  < > = values in this interval not displayed.   Hematology Recent Labs Lab 07/01/17 0435  07/01/17 2056 07/02/17 0358 07/03/17 0428  WBC 12.0*  --   --  15.9* 14.2*  RBC 4.06  --   --  4.25 4.39  HGB 11.4*  < > 13.6 12.2 12.6  HCT 36.7  < > 40.0 39.5 40.4  MCV 90.4  --   --  92.9 92.0  MCH 28.1  --   --  28.7 28.7  MCHC 31.1  --   --  30.9 31.2  RDW 14.7  --   --  15.2 14.7  PLT 216  --   --  260 259  < > = values in this interval not displayed.  Cardiac Enzymes Recent Labs Lab 06/26/17 2047 06/27/17 0429 06/27/17 0840  TROPONINI 0.09* 0.05* 0.04*    Recent Labs Lab 06/26/17 1539  TROPIPOC 0.01     BNPNo results for input(s): BNP, PROBNP in the last 168 hours.   DDimer No results for input(s): DDIMER in the last 168 hours.   Radiology      Dg Chest Port 1 View  Result Date: 07/03/2017 CLINICAL DATA:  Shortness of breath.  Hot and sweating. EXAM: PORTABLE CHEST 1 VIEW COMPARISON:  At 17:18. FINDINGS: Stable central catheter. Cardiac enlargement. Worsening aeration, developing BILATERAL pulmonary opacities most consistent with pulmonary edema. Low lung volumes. No osseous findings. IMPRESSION: Worsening aeration.  Developing pulmonary edema. Electronically Signed   By: Staci Righter M.D.   On: 07/03/2017 08:47   Dg Chest Port 1 View  Result Date: 07/02/2017 CLINICAL DATA:  Respiratory failure/hypoxia EXAM: PORTABLE CHEST 1 VIEW COMPARISON:  July 01, 2017 FINDINGS: Central catheter tip is in the superior vena cava. Endotracheal tube and nasogastric tube have been removed. No evident pneumothorax. There has been interval clearing of patchy consolidation from the left base. Mild left base atelectasis remains. Mild right base atelectasis medially is also stable. No new opacity. Heart is mildly enlarged with pulmonary vascularity within normal limits. No adenopathy. No bone lesions. IMPRESSION: Central catheter tip in superior vena cava. No pneumothorax. Stable cardiomegaly. Bibasilar atelectasis with interval significant clearing on the left. No new opacity on either side. Electronically Signed   By: Lowella Grip III M.D.   On: 07/02/2017 08:06    Cardiac Studies   2D Echo 06/27/17 Study Conclusions  -  Left ventricle: Diffuse hypokinesis worse in the inferior basal   wall The cavity size was moderately dilated. Wall thickness was   increased in a pattern of moderate LVH. Systolic function was   mildly to moderately reduced. The estimated ejection fraction was   in the range of 40% to 45%. Left ventricular diastolic function   parameters were normal. - Aortic valve: There was mild regurgitation. - Mitral valve: There was moderate regurgitation. - Left atrium: The atrium was moderately dilated. - Atrial septum: No defect or  patent foramen ovale was identified.  Patient Profile     56 y.o.femalewith past medical history of sarcoid, hypertension, hyperlipidemia, diastolic congestive heart failure, asthma admitted following cardiac arrest. Patient received 5-10 minutes of CPR following ventricular fibrillation arrest. She required one defibrillation to sinus rhythm. Echocardiogram shows ejection fraction 40-45%, moderate LVH, moderate LVE, mild aortic insufficiency, moderate mitral regurgitation and moderate left atrial enlargement.  Assessment & Plan    1. Cardiac Arrest/VT: etiology unclear at this point. Occurred suddenly. Echo with mild-moderately reduced LVEF. Plan is for LHC today to exclude underlying CAD. EP to also see later to determine need for ICD. May also consider cardiac MRI given her sarcoid. Electrolytes are stable. Keep K >4 and Mg >2. Mainly NSR on telemetry with one 6 beat run of NSVT over past 12 hrs. Continue BB.    2. Acute Systolic HF: I/Os net negative 17 L total since admit. Weight is down from 345 lb on admit to 270 lb. EF 40-45% by cath. LHC scheduled for today to exclude CAD. ? Getting RHC measurements to see if adequately diuresed. Difficult to assess volume given her obesity/ body habitus. She denies dyspnea. Continue BB. Her SCr appears to be stabilizing. Will need to consider addition of ACE/ARB. Low sodium diet. Continue to monitor daily weights.   3. PAF: currently NSR on telemetry. Continue BB therapy. Monitor.   4. AKI: SCr has improved from 1.65>>1.45>>1.37. Scr has remained stable ~1.3 over the last 3 days. Continue to monitor.   5. Acute Respiratory Failure: resolved. Extubated.   6. PVCs: occasional PVcs noted on telemety with 6 beat run of NSVT. Asymptomatic. Continue BB therapy with Coreg. Electrolytes are stable. Continue to monitor.   Signed, Lyda Jester, PA-C  07/03/2017, 10:51 AM    Personally seen and examined. Agree with above. Cath today - eval for CAD in  setting of VF arrest. After cath will get EP to see - ICD. May wish to MRI cardiac prior if no CAD  Candee Furbish, MD

## 2017-07-03 NOTE — Interval H&P Note (Signed)
Cath Lab Visit (complete for each Cath Lab visit)  Clinical Evaluation Leading to the Procedure:   ACS: Yes.    Non-ACS:    Anginal Classification: CCS II  Anti-ischemic medical therapy: Minimal Therapy (1 class of medications)  Non-Invasive Test Results: No non-invasive testing performed  Prior CABG: No previous CABG      History and Physical Interval Note:  07/03/2017 3:31 PM  Kari [name]  has presented today for surgery, with the diagnosis of cardiac arrest  The various methods of treatment have been discussed with the patient and family. After consideration of risks, benefits and other options for treatment, the patient has consented to  Procedure(s): Left Heart Cath and Coronary Angiography (N/A) as a surgical intervention .  The patient's history has been reviewed, patient examined, no change in status, stable for surgery.  I have reviewed the patient's chart and labs.  Questions were answered to the patient's satisfaction.     Belva Crome III

## 2017-07-03 NOTE — Progress Notes (Signed)
Progress Note  Patient Name: Kari [name] Date of Encounter: 07/03/2017  Primary Cardiologist: Dr. Harrington Challenger   Subjective   Resting comfortably. No complaints. Denies CP and dyspnea. Family currently by bedside.   Inpatient Medications    Scheduled Meds: . arformoterol  15 mcg Nebulization BID  . aspirin  81 mg Oral Daily  . budesonide (PULMICORT) nebulizer solution  0.5 mg Nebulization BID  . carvedilol  12.5 mg Oral BID WC  . Chlorhexidine Gluconate Cloth  6 each Topical Daily  . collagenase   Topical Daily  . heparin  5,000 Units Subcutaneous Q8H  . insulin aspart  2-6 Units Subcutaneous TID AC & HS  . insulin glargine  35 Units Subcutaneous Q24H  . mouth rinse  15 mL Mouth Rinse BID  . predniSONE  30 mg Oral Q breakfast  . sodium chloride flush  10-40 mL Intracatheter Q12H  . sodium chloride flush  3 mL Intravenous Q12H   Continuous Infusions: . sodium chloride    . sodium chloride 20 mL/hr at 07/01/17 1900  . sodium chloride    . sodium chloride 10 mL/hr at 07/03/17 0444  . dextrose 5 % and 0.9% NaCl     PRN Meds: sodium chloride, hydrALAZINE, ipratropium-albuterol, sodium chloride flush, sodium chloride flush   Vital Signs    Vitals:   07/02/17 2006 07/02/17 2228 07/03/17 0059 07/03/17 0404  BP: 135/70  120/71 111/66  Pulse: 63  67 67  Resp: 18  18 18   Temp: 98.5 F (36.9 C)  98.6 F (37 C) 98.6 F (37 C)  TempSrc: Oral  Oral Oral  SpO2: 98% 96% 97% 97%  Weight:    270 lb 14.4 oz (122.9 kg)  Height:        Intake/Output Summary (Last 24 hours) at 07/03/17 1051 Last data filed at 07/03/17 0807  Gross per 24 hour  Intake              350 ml  Output             6000 ml  Net            -5650 ml   Filed Weights   07/01/17 0500 07/02/17 0457 07/03/17 0404  Weight: (!) 304 lb 10.8 oz (138.2 kg) 292 lb 11.2 oz (132.8 kg) 270 lb 14.4 oz (122.9 kg)    Telemetry    NSR with PVCs and 6 beat run of NSVT on telemetry- Personally Reviewed  ECG    NSR  with PVCs 06/27/17 - Personally Reviewed  Physical Exam   GEN: No acute distress. Morbidly obese  Neck: No JVD Cardiac: RRR, no murmurs, rubs, or gallops.  Respiratory: Clear to auscultation bilaterally. GI: Soft, nontender, non-distended  MS: chronic venous stasis dermatitis, both legs wrapped with ACE bandages Neuro:  Nonfocal  Psych: Normal affect   Labs    Chemistry Recent Labs Lab 06/26/17 1533  07/01/17 0435  07/01/17 2056 07/02/17 0358 07/03/17 0428  NA 138  < > 135  < > 143 137 138  K 3.1*  < > 3.7  < > 3.5 3.8 3.8  CL 107  < > 102  < > 102 101 100*  CO2 19*  < > 26  --   --  29 30  GLUCOSE 202*  < > 128*  < > 80 78 77  BUN 22*  < > 44*  < > 39* 37* 38*  CREATININE 1.39*  < > 1.45*  < >  1.30* 1.32* 1.37*  CALCIUM 8.4*  < > 8.8*  --   --  8.8* 9.3  PROT 7.9  --   --   --   --   --   --   ALBUMIN 3.2*  --   --   --   --   --   --   AST 168*  --   --   --   --   --   --   ALT 108*  --   --   --   --   --   --   ALKPHOS 71  --   --   --   --   --   --   BILITOT 0.7  --   --   --   --   --   --   GFRNONAA 42*  < > 40*  --   --  44* 43*  GFRAA 48*  < > 46*  --   --  52* 49*  ANIONGAP 12  < > 7  --   --  7 8  < > = values in this interval not displayed.   Hematology Recent Labs Lab 07/01/17 0435  07/01/17 2056 07/02/17 0358 07/03/17 0428  WBC 12.0*  --   --  15.9* 14.2*  RBC 4.06  --   --  4.25 4.39  HGB 11.4*  < > 13.6 12.2 12.6  HCT 36.7  < > 40.0 39.5 40.4  MCV 90.4  --   --  92.9 92.0  MCH 28.1  --   --  28.7 28.7  MCHC 31.1  --   --  30.9 31.2  RDW 14.7  --   --  15.2 14.7  PLT 216  --   --  260 259  < > = values in this interval not displayed.  Cardiac Enzymes Recent Labs Lab 06/26/17 2047 06/27/17 0429 06/27/17 0840  TROPONINI 0.09* 0.05* 0.04*    Recent Labs Lab 06/26/17 1539  TROPIPOC 0.01     BNPNo results for input(s): BNP, PROBNP in the last 168 hours.   DDimer No results for input(s): DDIMER in the last 168 hours.   Radiology      Dg Chest Port 1 View  Result Date: 07/03/2017 CLINICAL DATA:  Shortness of breath.  Hot and sweating. EXAM: PORTABLE CHEST 1 VIEW COMPARISON:  At 17:18. FINDINGS: Stable central catheter. Cardiac enlargement. Worsening aeration, developing BILATERAL pulmonary opacities most consistent with pulmonary edema. Low lung volumes. No osseous findings. IMPRESSION: Worsening aeration.  Developing pulmonary edema. Electronically Signed   By: Staci Righter M.D.   On: 07/03/2017 08:47   Dg Chest Port 1 View  Result Date: 07/02/2017 CLINICAL DATA:  Respiratory failure/hypoxia EXAM: PORTABLE CHEST 1 VIEW COMPARISON:  July 01, 2017 FINDINGS: Central catheter tip is in the superior vena cava. Endotracheal tube and nasogastric tube have been removed. No evident pneumothorax. There has been interval clearing of patchy consolidation from the left base. Mild left base atelectasis remains. Mild right base atelectasis medially is also stable. No new opacity. Heart is mildly enlarged with pulmonary vascularity within normal limits. No adenopathy. No bone lesions. IMPRESSION: Central catheter tip in superior vena cava. No pneumothorax. Stable cardiomegaly. Bibasilar atelectasis with interval significant clearing on the left. No new opacity on either side. Electronically Signed   By: Lowella Grip III M.D.   On: 07/02/2017 08:06    Cardiac Studies   2D Echo 06/27/17 Study Conclusions  -  Left ventricle: Diffuse hypokinesis worse in the inferior basal   wall The cavity size was moderately dilated. Wall thickness was   increased in a pattern of moderate LVH. Systolic function was   mildly to moderately reduced. The estimated ejection fraction was   in the range of 40% to 45%. Left ventricular diastolic function   parameters were normal. - Aortic valve: There was mild regurgitation. - Mitral valve: There was moderate regurgitation. - Left atrium: The atrium was moderately dilated. - Atrial septum: No defect or  patent foramen ovale was identified.  Patient Profile     56 y.o.femalewith past medical history of sarcoid, hypertension, hyperlipidemia, diastolic congestive heart failure, asthma admitted following cardiac arrest. Patient received 5-10 minutes of CPR following ventricular fibrillation arrest. She required one defibrillation to sinus rhythm. Echocardiogram shows ejection fraction 40-45%, moderate LVH, moderate LVE, mild aortic insufficiency, moderate mitral regurgitation and moderate left atrial enlargement.  Assessment & Plan    1. Cardiac Arrest/VT: etiology unclear at this point. Occurred suddenly. Echo with mild-moderately reduced LVEF. Plan is for LHC today to exclude underlying CAD. EP to also see later to determine need for ICD. May also consider cardiac MRI given her sarcoid. Electrolytes are stable. Keep K >4 and Mg >2. Mainly NSR on telemetry with one 6 beat run of NSVT over past 12 hrs. Continue BB.    2. Acute Systolic HF: I/Os net negative 17 L total since admit. Weight is down from 345 lb on admit to 270 lb. EF 40-45% by cath. LHC scheduled for today to exclude CAD. ? Getting RHC measurements to see if adequately diuresed. Difficult to assess volume given her obesity/ body habitus. She denies dyspnea. Continue BB. Her SCr appears to be stabilizing. Will need to consider addition of ACE/ARB. Low sodium diet. Continue to monitor daily weights.   3. PAF: currently NSR on telemetry. Continue BB therapy. Monitor.   4. AKI: SCr has improved from 1.65>>1.45>>1.37. Scr has remained stable ~1.3 over the last 3 days. Continue to monitor.   5. Acute Respiratory Failure: resolved. Extubated.   6. PVCs: occasional PVcs noted on telemety with 6 beat run of NSVT. Asymptomatic. Continue BB therapy with Coreg. Electrolytes are stable. Continue to monitor.   Signed, Lyda Jester, PA-C  07/03/2017, 10:51 AM    Personally seen and examined. Agree with above. Cath today - eval for CAD in  setting of VF arrest. After cath will get EP to see - ICD. May wish to MRI cardiac prior if no CAD  Candee Furbish, MD

## 2017-07-03 NOTE — Care Management Note (Signed)
Case Management Note  Patient Details  Name: Kari [name] MRN: [id number] Date of Birth: 1961-01-30  Action/Plan: CM talked to patient at the bedside; she lives at home with her family ( brother and sister); she does not drive or cook; Primary Physician: Rosita Fire, MD; has private insurance with Medicare / Medicaid with prescription drug coverage; she does not remember what pharmacy that she used; DME- walker and cane at home; Lots of emotional support given to patient regarding her ovarian mass, pt stated " I'm afraid to have surgery."Physical Therapy to eval for disposition needs; CM will continue to follow for DCP.  Expected Discharge Date:    possibly 07/06/2017              Expected Discharge Plan:  Home/Self Care  In-House Referral:   [facility]  Discharge planning Services  CM Consult  Post Acute Care Choice:  Home Health, Resumption of Svcs/PTA Provider  HH Arranged:  RN, PT, OT [facility] Agency:  [place]  Status of Service:  In process, will continue to follow   Royston Bake, RN 07/03/2017, 10:35 AM

## 2017-07-03 NOTE — Progress Notes (Signed)
ANTICOAGULATION CONSULT NOTE - Initial Consult  Pharmacy Consult for enoxaparin Indication: VTE prophylaxis in obese patient   No Known Allergies  Patient Measurements: Height: 5\' 5"  (165.1 cm) Weight: 270 lb 14.4 oz (122.9 kg) IBW/kg (Calculated) : 57   Vital Signs: Temp: 98.4 F (36.9 C) (07/11 1718) Temp Source: Oral (07/11 1718) BP: 148/74 (07/11 1718) Pulse Rate: 57 (07/11 1718)  Labs:  Recent Labs  07/01/17 0435  07/01/17 2056 07/02/17 0358 07/03/17 0428  HGB 11.4*  < > 13.6 12.2 12.6  HCT 36.7  < > 40.0 39.5 40.4  PLT 216  --   --  260 259  LABPROT  --   --   --   --  15.2  INR  --   --   --   --  1.19  CREATININE 1.45*  < > 1.30* 1.32* 1.37*  < > = values in this interval not displayed.  Estimated Creatinine Clearance: 61.1 mL/min (A) (by C-G formula based on SCr of 1.37 mg/dL (H)).   Medical History: Past Medical History:  Diagnosis Date  . Asthma   . CHF (congestive heart failure) ([place]) 03/12/2014  . Depression   . Headache(784.0)   . Hyperlipidemia   . Hypertension   . Lung nodules   . Renal disorder   . Sarcoidosis   . Ulcer    left posterior calf     Assessment: 56yom admitted s/p cardiac arrest and now post cath - Heparin ordered for VTE px.  CBC ok Will use enoxaparin 0.5mg /kg/q24h -dosing in obesity wt 120kg  Goal of Therapy:   Monitor platelets by anticoagulation protocol: Yes   Plan:  Lovenox 60mg  sq daily start in am Monitor s/s bleeding  Bonnita Nasuti Pharm.D. CPP, BCPS Clinical Pharmacist [phone] 07/03/2017 6:10 PM

## 2017-07-03 NOTE — Evaluation (Signed)
Physical Therapy Evaluation Patient Details Name: Kari [name] MRN: [id number] DOB: Sep 19, 1961 Today's Date: 07/03/2017   History of Present Illness  56 year old black female who experienced cardiac arrest, patient received 5-10 minutes of CPR following ventricular fibrillation arrest. She required one defibrillation to sinus rhythm. Echocardiogram shows ejection fraction 40-45%, moderate LVH, moderate LVE, mild aortic insufficiency, moderate mitral regurgitation and moderate left atrial enlargement. PMH for CHF, sarcoidosis, renal insufficiency, hypertension, and history of chronic stasis disease of lower extremities.   Clinical Impression  Pt admitted with above diagnosis. Pt currently with functional limitations due to the deficits listed below (see PT Problem List). Pt is minA for bed mobility and transfers and min guard for ambulation of 75 ft with RW.  Pt will benefit from skilled PT to increase their independence and safety with mobility to allow discharge to the venue listed below.       Follow Up Recommendations Home health PT;Supervision/Assistance - 24 hour    Equipment Recommendations  Rolling walker with 5" wheels;3in1 (PT)    Recommendations for Other Services       Precautions / Restrictions Precautions Precautions: None Restrictions Weight Bearing Restrictions: No      Mobility  Bed Mobility Overal bed mobility: Needs Assistance Bed Mobility: Supine to Sit     Supine to sit: Min assist     General bed mobility comments: minA for LE management to floor, vc for hand placement on bedrails to pull to EoB  Transfers Overall transfer level: Needs assistance Equipment used: Rolling walker (2 wheeled) Transfers: Sit to/from Stand Sit to Stand: Min assist;From elevated surface         General transfer comment: minA for power up and steadying at upright. vc for hand placement for push up from the bed   Ambulation/Gait Ambulation/Gait assistance: Min  guard Ambulation Distance (Feet): 75 Feet Assistive device: Rolling walker (2 wheeled) Gait Pattern/deviations: Step-through pattern;Shuffle;Trunk flexed Gait velocity: slowed Gait velocity interpretation: Below normal speed for age/gender General Gait Details: min guard for safety, vc for staying up inside walker, and for management of walker around obstacles in hall. pt with steady slow cadence, no LoB.         Balance Overall balance assessment: Needs assistance Sitting-balance support: Feet supported;No upper extremity supported Sitting balance-Leahy Scale: Good     Standing balance support: Bilateral upper extremity supported Standing balance-Leahy Scale: Fair Standing balance comment: uses RW for static balance                             Pertinent Vitals/Pain Pain Assessment: No/denies pain  VSS    Home Living Family/patient expects to be discharged to:: Private residence Living Arrangements: Other relatives (sister) Available Help at Discharge: Family;Available 24 hours/day Type of Home: Apartment Home Access: Level entry     Home Layout: One level Home Equipment: None      Prior Function Level of Independence: Needs assistance   Gait / Transfers Assistance Needed: ambulates household distances  ADL's / Homemaking Assistance Needed: independent with ADLs and sister helps with iADLs        Hand Dominance   Dominant Hand: Right    Extremity/Trunk Assessment   Upper Extremity Assessment Upper Extremity Assessment: Overall WFL for tasks assessed    Lower Extremity Assessment Lower Extremity Assessment: Generalized weakness;RLE deficits/detail;LLE deficits/detail RLE Deficits / Details: venous stasis wound on lower leg, hip and knee ROM limited by bocy habitus, strength of hips and  legs grossly 3+/5  RLE Sensation: decreased light touch LLE Deficits / Details: venous stasis wound on lower leg, hip and knee ROM limited by bocy habitus,  strength of hips and legs grossly 3+/5  LLE Sensation: decreased light touch       Communication   Communication: No difficulties  Cognition Arousal/Alertness: Awake/alert Behavior During Therapy: WFL for tasks assessed/performed Overall Cognitive Status: Within Functional Limits for tasks assessed                                               Assessment/Plan    PT Assessment Patient needs continued PT services  PT Problem List Decreased strength;Decreased range of motion;Decreased activity tolerance;Decreased balance;Decreased mobility;Decreased knowledge of use of DME;Decreased safety awareness;Impaired sensation       PT Treatment Interventions DME instruction;Gait training;Functional mobility training;Therapeutic activities;Therapeutic exercise;Balance training;Patient/family education    PT Goals (Current goals can be found in the Care Plan section)  Acute Rehab PT Goals Patient Stated Goal: go home PT Goal Formulation: With patient Time For Goal Achievement: 07/10/17 Potential to Achieve Goals: Good    Frequency Min 3X/week    AM-PAC PT "6 Clicks" Daily Activity  Outcome Measure Difficulty turning over in bed (including adjusting bedclothes, sheets and blankets)?: A Lot Difficulty moving from lying on back to sitting on the side of the bed? : Total Difficulty sitting down on and standing up from a chair with arms (e.g., wheelchair, bedside commode, etc,.)?: Total Help needed moving to and from a bed to chair (including a wheelchair)?: A Little Help needed walking in hospital room?: A Little Help needed climbing 3-5 steps with a railing? : A Lot 6 Click Score: 12    End of Session Equipment Utilized During Treatment: Gait belt Activity Tolerance: Patient tolerated treatment well Patient left: in bed;with call bell/phone within reach;with nursing/sitter in room Nurse Communication: Mobility status PT Visit Diagnosis: Unsteadiness on feet  (R26.81);Other abnormalities of gait and mobility (R26.89);Muscle weakness (generalized) (M62.81);Difficulty in walking, not elsewhere classified (R26.2)    Time: 2229-7989 PT Time Calculation (min) (ACUTE ONLY): 41 min   Charges:   PT Evaluation $PT Eval Moderate Complexity: 1 Procedure PT Treatments $Gait Training: 8-22 mins $Therapeutic Activity: 8-22 mins   PT G Codes:        Chance Karam B. Migdalia Dk PT, DPT Acute Rehabilitation  [phone] Pager [phone]    [place] 07/03/2017, 2:03 PM

## 2017-07-04 ENCOUNTER — Inpatient Hospital Stay (HOSPITAL_COMMUNITY): Payer: Medicare Other

## 2017-07-04 ENCOUNTER — Encounter (HOSPITAL_COMMUNITY): Payer: Self-pay | Admitting: Interventional Cardiology

## 2017-07-04 DIAGNOSIS — I4901 Ventricular fibrillation: Secondary | ICD-10-CM

## 2017-07-04 LAB — CBC
HCT: 38.4 % (ref 36.0–46.0)
Hemoglobin: 11.9 g/dL — ABNORMAL LOW (ref 12.0–15.0)
MCH: 28.3 pg (ref 26.0–34.0)
MCHC: 31 g/dL (ref 30.0–36.0)
MCV: 91.2 fL (ref 78.0–100.0)
Platelets: 221 10*3/uL (ref 150–400)
RBC: 4.21 MIL/uL (ref 3.87–5.11)
RDW: 14.5 % (ref 11.5–15.5)
WBC: 11.4 10*3/uL — ABNORMAL HIGH (ref 4.0–10.5)

## 2017-07-04 LAB — COMPREHENSIVE METABOLIC PANEL
ALT: 26 U/L (ref 14–54)
AST: 15 U/L (ref 15–41)
Albumin: 2.7 g/dL — ABNORMAL LOW (ref 3.5–5.0)
Alkaline Phosphatase: 47 U/L (ref 38–126)
Anion gap: 5 (ref 5–15)
BUN: 34 mg/dL — ABNORMAL HIGH (ref 6–20)
CO2: 28 mmol/L (ref 22–32)
Calcium: 8.8 mg/dL — ABNORMAL LOW (ref 8.9–10.3)
Chloride: 105 mmol/L (ref 101–111)
Creatinine, Ser: 1.22 mg/dL — ABNORMAL HIGH (ref 0.44–1.00)
GFR calc Af Amer: 57 mL/min — ABNORMAL LOW (ref >60)
GFR calc non Af Amer: 49 mL/min — ABNORMAL LOW (ref >60)
Glucose, Bld: 112 mg/dL — ABNORMAL HIGH (ref 65–99)
Potassium: 4.2 mmol/L (ref 3.5–5.1)
Sodium: 138 mmol/L (ref 135–145)
Total Bilirubin: 0.8 mg/dL (ref 0.3–1.2)
Total Protein: 6.9 g/dL (ref 6.5–8.1)

## 2017-07-04 LAB — GLUCOSE, CAPILLARY
Glucose-Capillary: 113 mg/dL — ABNORMAL HIGH (ref 65–99)
Glucose-Capillary: 141 mg/dL — ABNORMAL HIGH (ref 65–99)
Glucose-Capillary: 165 mg/dL — ABNORMAL HIGH (ref 65–99)

## 2017-07-04 MED ORDER — FUROSEMIDE 40 MG PO TABS
40.0000 mg | ORAL_TABLET | Freq: Every day | ORAL | Status: DC
  Administered 2017-07-04 – 2017-07-06 (×3): 40 mg via ORAL
  Filled 2017-07-04 (×3): qty 1

## 2017-07-04 MED ORDER — SODIUM CHLORIDE 0.9 % IV SOLN
INTRAVENOUS | Status: DC
  Administered 2017-07-05: 06:00:00 via INTRAVENOUS

## 2017-07-04 MED ORDER — CHLORHEXIDINE GLUCONATE 4 % EX LIQD
60.0000 mL | Freq: Once | CUTANEOUS | Status: AC
  Administered 2017-07-05: 4 via TOPICAL
  Filled 2017-07-04: qty 15

## 2017-07-04 MED ORDER — SODIUM CHLORIDE 0.9 % IR SOLN
80.0000 mg | Status: DC
  Filled 2017-07-04: qty 2

## 2017-07-04 MED ORDER — DEXTROSE 5 % IV SOLN
3.0000 g | INTRAVENOUS | Status: AC
  Administered 2017-07-05: 3 g via INTRAVENOUS
  Filled 2017-07-04 (×2): qty 3000

## 2017-07-04 MED ORDER — LISINOPRIL 5 MG PO TABS
5.0000 mg | ORAL_TABLET | Freq: Every day | ORAL | Status: DC
  Administered 2017-07-04 – 2017-07-06 (×3): 5 mg via ORAL
  Filled 2017-07-04 (×3): qty 1

## 2017-07-04 MED ORDER — SODIUM CHLORIDE 0.9% FLUSH: 3.0000 mL | INTRAVENOUS | Status: DC | PRN

## 2017-07-04 MED ORDER — CHLORHEXIDINE GLUCONATE 4 % EX LIQD
60.0000 mL | Freq: Once | CUTANEOUS | Status: AC
  Administered 2017-07-05: 4 via TOPICAL

## 2017-07-04 MED ORDER — SODIUM CHLORIDE 0.9 % IV SOLN: 250.0000 mL | INTRAVENOUS | Status: DC

## 2017-07-04 MED ORDER — GADOBENATE DIMEGLUMINE 529 MG/ML IV SOLN
35.0000 mL | Freq: Once | INTRAVENOUS | Status: AC | PRN
  Administered 2017-07-04: 35 mL via INTRAVENOUS

## 2017-07-04 MED ORDER — SODIUM CHLORIDE 0.9% FLUSH
3.0000 mL | Freq: Two times a day (BID) | INTRAVENOUS | Status: DC
  Administered 2017-07-05 (×2): 3 mL via INTRAVENOUS

## 2017-07-04 NOTE — Progress Notes (Signed)
VSS during 7 a to 7 p shift, patient without complaints.  Sat up in chair most of shift visiting with family members.  Has remained adamant that she is not having defibrillator placed.

## 2017-07-04 NOTE — Progress Notes (Signed)
Heart Failure Navigator Consult Note  Presentation: Kari [name] is a 56 year old female with past medical history significant for asthma, heart failure (11/2016 EF 55-60%) , HTN, Questionable sarcoidosis (based on mediastinal lymphadenopathy no biopsy), HLD, obesity, chronic BLE ulcers, large cystic ovarian mass, headache and depression who presented to [facility] ED on 7/4 in cardiac arrest around 1455.    ED staff state that they have contacted her family.  Questionably patient lives with her brother who is traveling back from [place].  Patient also has a sister who was present at [facility] and unaware of any recent complaints from patient.   Reportedly patient had asked a stranger for a ride from the [employer] and went unresponsive in the car.  Bystander and FD CPR was started.  EMS arrived and given one epinephrine and one defibrillation for v-fib.  Estimated 5 -10 mins of CPR were given prior to ROSC.  King airway was placed and patient transferred to the ED where patient remained unresponsive not requiring any sedation.  Hypotensive treated with 3.5 L NS bolus.  Labs noted for glucose 112, Na 138, K 3.1, CO2 19, sCr 1.39 (previously 1.17 on 5/18), BUN 22, AST 160, ALT 108, albumin 3.2, troponin I poc 0.01, Lactic acid 6.91, WBC 8.5, Hgb 11.2, UDS neg, ABG 7.32/39.3/270 on 60% FiO2.  CXR shows pulmonary vascular congestion. EKG non acute.  Definitive airway placed in ED.  Head CT negative. Patient to be transferred to [facility] for targeted temperature.  Past Medical History:  Diagnosis Date  . Asthma   . CHF (congestive heart failure) ([place]) 03/12/2014  . Depression   . Headache(784.0)   . Hyperlipidemia   . Hypertension   . Lung nodules   . Renal disorder   . Sarcoidosis   . Ulcer    left posterior calf    Social History   Social History  . Marital status: Single    Spouse name: N/A  . Number of children: 1  . Years of education: N/A   Social History Main Topics  .  Smoking status: Never Smoker  . Smokeless tobacco: Never Used  . Alcohol use No  . Drug use: No  . Sexual activity: Not Currently   Other Topics Concern  . Not on file   Social History Narrative   Lives alone.    ECHO:Study Conclusions--06/27/17  - Left ventricle: Diffuse hypokinesis worse in the inferior basal   wall The cavity size was moderately dilated. Wall thickness was   increased in a pattern of moderate LVH. Systolic function was   mildly to moderately reduced. The estimated ejection fraction was   in the range of 40% to 45%. Left ventricular diastolic function   parameters were normal. - Aortic valve: There was mild regurgitation. - Mitral valve: There was moderate regurgitation. - Left atrium: The atrium was moderately dilated. - Atrial septum: No defect or patent foramen ovale was identified.  ------------------------------------------------------------------- Study data:  Comparison was made to the study of 12/18/2016.  Study status:  Routine.  Procedure:  The patient reported no pain pre or post test. Transthoracic echocardiography. Image quality was adequate. Intravenous contrast (Definity) was administered.  Study completion:  There were no complications.          Transthoracic echocardiography.  M-mode, complete 2D, spectral Doppler, and color Doppler.  Birthdate:  Patient birthdate: Apr 21, 1961.  Age:  Patient is 56 yr old.  Sex:  Gender: female.    BMI: 49.9 kg/m^2.  Blood pressure:     99/61  Patient status:  Inpatient.  Study date: Study date: 06/27/2017. Study time: 09:44 AM.  Location:  Bedside.  BNP    Component Value Date/Time   BNP 56.0 12/18/2016 0034   BNP 178.3 (H) 03/31/2014 1010    ProBNP    Component Value Date/Time   PROBNP 592.5 (H) 04/10/2014 0923     Education Assessment and Provision:  Detailed education and instructions provided on heart failure disease management including the following:  Signs and symptoms of Heart  Failure When to call the physician Importance of daily weights Low sodium diet Fluid restriction Medication management Anticipated future follow-up appointments  Patient education given on each of the above topics.  Patient acknowledges understanding and acceptance of all instructions.  I spoke to Ms. Allegretto regarding her hospitalization and HF.  She tells me that no one has ever mentioned that she had HF or reviewed HF recommendations for home.  She tells me that physician said he heart was "good" after "look yesterday".  I reviewed the importance of daily weights.  She admits that she does not have a scale and "will get one" after discharge.  I also briefly reviewed a low sodium diet and high sodium foods to avoid.  She denies any issues with getting or taking prescribed medications.  Our conversation was cut short by the nurse in order place IVs for possible ICD placement tomorrow.  I will plan to return to review further education tomorrow.  Education Materials:  "Living Better With Heart Failure" Booklet, Daily Weight Tracker Tool    High Risk Criteria for Readmission and/or Poor Patient Outcomes:   EF <30%- No 40-45%  2 or more admissions in 6 months- 2/85mo  Difficult social situation- No denies  Demonstrates medication noncompliance- No    Barriers of Care:  Knowledge and compliance  Discharge Planning:   Plans to return to home with brother?  Lives in [place].  Daughter requests follow-up appt at [facility] if possible.

## 2017-07-04 NOTE — Progress Notes (Signed)
PT Cancellation Note  Patient Details Name: Kari [name] MRN: [id number] DOB: 1961-07-24   Cancelled Treatment:    Reason Eval/Treat Not Completed: Patient at procedure or test/unavailable  PT will continue to follow acutely.    [place] April, PTA Pager: [phone]   07/04/2017, 3:13 PM

## 2017-07-04 NOTE — Consult Note (Signed)
Cardiology Consultation:   Patient ID: Kari [name]; [id number]; Jul 26, 1961   Admit date: 06/26/2017 Date of Consult: 07/04/2017  Primary Care Provider: Rosita Fire, MD Primary Cardiologist: Dr. Stanford Breed Primary Electrophysiologist:  New to Dr. Curt Bears today   Patient Profile:   Kari [name] is a 56 y.o. female with a hx of sarcoidosis, HTN, HLD, HFpEF, a known laerge ovarian mass was in the process of getting referred for surgical management, venous stasis, no known cardiac/coronary history.  She was treated in April for venous stasis ulcers and LE cellulitis and noted ovarian mass surgery was cancelled 2/2 electrolyte imbalance.  admitted 06/26/17 with VF arrest.  She is being seen today for the evaluation for ICD at the request of [place].  History of Present Illness:   Kari [name] was admitted 06/26/17 who was in a car when she collapsed, reportedly got bystander CPR, EMS arrived and given one epinephrine and one defibrillation for v-fib.  Estimated 5 -10 mins of CPR were given prior to Hermann, she remained unresponsive and hypotensive, intubated and hypothermia protocol initiated.  Presenting findings: glucose 112, Na 138, K 3.1, CO2 19, sCr 1.39 (previously 1.17 on 5/18), BUN 22, AST 160, ALT 108, albumin 3.2, troponin I poc 0.01, Lactic acid 6.91, WBC 8.5, Hgb 11.2, UDS neg, ABG 7.32/39.3/270 on 60% FiO2.  CXR shows pulmonary vascular congestion  She saw Dr. Stanford Breed in 2014 for chest pain and had a stress test at that time which did not demonstrate ischemia.  Record indicate family reported she had a syncope/seizure like episode on last christmas at which time her brother thumped her back and she recovered  She was observed on telemetry to have multiple rhythm abnormalities, AIVR, PVC's, and AFib started on amiodarone  Last AF described on 8th Extubated 07/01/17 Risk of aspiration pneumonia completed a course of emperic Abx   Most recent labs K+ 4.2 BUN/Creat 34/1.22 Mag  2.7 WBC 11.4 H/H 11/38 plts 221  The patient is currently feeling very well, denies any CP or SOB.  She has no recollection of events, last she remembers she was out shopping.  HPI/events were completely obtained from the chart  Past Medical History:  Diagnosis Date  . Asthma   . CHF (congestive heart failure) ([place]) 03/12/2014  . Depression   . Headache(784.0)   . Hyperlipidemia   . Hypertension   . Lung nodules   . Renal disorder   . Sarcoidosis   . Ulcer    left posterior calf    Past Surgical History:  Procedure Laterality Date  . cataract surgery Left 07-2013  . LEFT HEART CATH AND CORONARY ANGIOGRAPHY N/A 07/03/2017   Procedure: Left Heart Cath and Coronary Angiography;  Surgeon: Belva Crome, MD;  Location: [facility];  Service: Cardiovascular;  Laterality: N/A;  . NO PAST SURGERIES       Inpatient Medications: Scheduled Meds: . arformoterol  15 mcg Nebulization BID  . aspirin  81 mg Oral Daily  . budesonide (PULMICORT) nebulizer solution  0.5 mg Nebulization BID  . carvedilol  12.5 mg Oral BID WC  . Chlorhexidine Gluconate Cloth  6 each Topical Daily  . collagenase   Topical Daily  . enoxaparin (LOVENOX) injection  60 mg Subcutaneous Q24H  . mouth rinse  15 mL Mouth Rinse BID  . predniSONE  30 mg Oral Q breakfast  . sodium chloride flush  10-40 mL Intracatheter Q12H  . sodium chloride flush  3 mL Intravenous Q12H  Continuous Infusions: . sodium chloride 20 mL/hr at 07/01/17 1900  . sodium chloride    . dextrose 5 % and 0.9% NaCl 50 mL/hr at 07/03/17 1245   PRN Meds: sodium chloride, acetaminophen, hydrALAZINE, ipratropium-albuterol, ondansetron (ZOFRAN) IV, oxyCODONE-acetaminophen, sodium chloride flush, sodium chloride flush  Allergies:   No Known Allergies  Social History:   Social History   Social History  . Marital status: Single    Spouse name: N/A  . Number of children: 1  . Years of education: N/A   Occupational History  . Not on  file.   Social History Main Topics  . Smoking status: Never Smoker  . Smokeless tobacco: Never Used  . Alcohol use No  . Drug use: No  . Sexual activity: Not Currently   Other Topics Concern  . Not on file   Social History Narrative   Lives alone.    Family History:   The patient's family history includes Cancer in her mother; Diabetes Mellitus II in her father and mother; Heart disease in her father; Heart failure in her mother; Hypertension in her mother; Stroke in her father.  ROS:  Please see the history of present illness.  ROS  All other ROS reviewed and negative.     Physical Exam/Data:   Vitals:   07/03/17 1652 07/03/17 1718 07/03/17 2005 07/04/17 0645  BP: (!) 137/91 (!) 148/74 140/90 132/83  Pulse: 68 (!) 57 60 66  Resp: 18 18 18 18   Temp:  98.4 F (36.9 C) 98.3 F (36.8 C) 98.8 F (37.1 C)  TempSrc:  Oral Oral Oral  SpO2: 97% 97% 98% 100%  Weight:    272 lb 6.4 oz (123.6 kg)  Height:        Intake/Output Summary (Last 24 hours) at 07/04/17 1041 Last data filed at 07/04/17 0856  Gross per 24 hour  Intake              840 ml  Output              950 ml  Net             -110 ml   Filed Weights   07/02/17 0457 07/03/17 0404 07/04/17 0645  Weight: 292 lb 11.2 oz (132.8 kg) 270 lb 14.4 oz (122.9 kg) 272 lb 6.4 oz (123.6 kg)   Body mass index is 45.33 kg/m.  General:  Well nourished, well developed, morbidly obese in no acute distress HEENT: normal Lymph: no adenopathy Neck: no JVD Endocrine:  No thryomegaly Vascular: No carotid bruits; FA pulses 2+ bilaterally without bruits  Cardiac:   RRR; no murmur no significant murmurs are appreciate, no rubs/gallops Lungs:  Diminished at the bases, no wheezing, rhonchi or rales  Abd: soft, nontender, no hepatomegaly  Ext: b/l LE wrapped with ACE bandages, no apprciable edema Musculoskeletal:  No deformities, BUE and BLE strength normal and equal Skin: warm and dry  Neuro:  CNs 2-12 intact, no focal  abnormalities noted Psych:  Normal affect   EKG:  The EKG was personally reviewed and demonstrates:   Presenting looks wide complex rhythm, irregular, 72bpm Most recent 06/27/17 is SB, 46bpm, PR 230ms, QRS 130ms, QTc 561 (hypothermia protocol in progress) 04/01/17: EKG was SR, 91bpm, PR 18ms, QRS 162ms, QTc 451ms, PVCs Telemetry:  Telemetry was personally reviewed and demonstrates:  SR, occ PVC's/couplets  Relevant CV Studies: 06/27/17: TTE - Left ventricle: Diffuse hypokinesis worse in the inferior basal wall The cavity size was moderately dilated. Wall  thickness was increased in a pattern of moderate LVH. Systolic function was mildly to moderately reduced. The estimated ejection fraction was in the range of 40% to 45%. Left ventricular diastolic function parameters were normal. - Aortic valve: There was mild regurgitation. - Mitral valve: There was moderate regurgitation. - Left atrium: The atrium was moderately dilated. - Atrial septum: No defect or patent foramen ovale was identified.  07/03/17: LHC Conclusion   Left dominant coronary anatomy.  Normal coronary arteries.  Mild to moderate global left ventricular hypo-kinesis with estimated EF 35-40%. LV end-diastolic pressure is up and normal. Combined systolic and diastolic heart failure, chronic Recommendations:  Further management per treating team including CAD including guideline directed heart failure therapy  EP evaluation for possible AICD.     Laboratory Data:  Chemistry Recent Labs Lab 07/02/17 0358 07/03/17 0428 07/04/17 0359  NA 137 138 138  K 3.8 3.8 4.2  CL 101 100* 105  CO2 29 30 28   GLUCOSE 78 77 112*  BUN 37* 38* 34*  CREATININE 1.32* 1.37* 1.22*  CALCIUM 8.8* 9.3 8.8*  GFRNONAA 44* 43* 49*  GFRAA 52* 49* 57*  ANIONGAP 7 8 5      Recent Labs Lab 07/04/17 0359  PROT 6.9  ALBUMIN 2.7*  AST 15  ALT 26  ALKPHOS 47  BILITOT 0.8   Hematology Recent Labs Lab 07/02/17 0358  07/03/17 0428 07/04/17 0359  WBC 15.9* 14.2* 11.4*  RBC 4.25 4.39 4.21  HGB 12.2 12.6 11.9*  HCT 39.5 40.4 38.4  MCV 92.9 92.0 91.2  MCH 28.7 28.7 28.3  MCHC 30.9 31.2 31.0  RDW 15.2 14.7 14.5  PLT 260 259 221    Radiology/Studies:  Dg Chest Port 1 View Result Date: 07/03/2017 CLINICAL DATA:  Shortness of breath.  Hot and sweating. EXAM: PORTABLE CHEST 1 VIEW COMPARISON:  At 17:18. FINDINGS: Stable central catheter. Cardiac enlargement. Worsening aeration, developing BILATERAL pulmonary opacities most consistent with pulmonary edema. Low lung volumes. No osseous findings. IMPRESSION: Worsening aeration.  Developing pulmonary edema. Electronically Signed   By: Staci Righter M.D.   On: 07/03/2017 08:47   Dg Chest Port 1 View Result Date: 07/02/2017 CLINICAL DATA:  Respiratory failure/hypoxia EXAM: PORTABLE CHEST 1 VIEW COMPARISON:  July 01, 2017 FINDINGS: Central catheter tip is in the superior vena cava. Endotracheal tube and nasogastric tube have been removed. No evident pneumothorax. There has been interval clearing of patchy consolidation from the left base. Mild left base atelectasis remains. Mild right base atelectasis medially is also stable. No new opacity. Heart is mildly enlarged with pulmonary vascularity within normal limits. No adenopathy. No bone lesions. IMPRESSION: Central catheter tip in superior vena cava. No pneumothorax. Stable cardiomegaly. Bibasilar atelectasis with interval significant clearing on the left. No new opacity on either side. Electronically Signed   By: Lowella Grip III M.D.   On: 07/02/2017 08:06    Assessment and Plan:   1. VF arrest     Currently on Coreg alone     EF by LV gram 35-40%     Hx of sarcoid, Lilyona Richner get MRI       Patient was seen by Dr. Curt Bears, discussed ICD indication, the procedure, risks/benefits and she is agreeable to proceed.  Sherwood Castilla plan for tomorrow after MRI is done.  2. MSOF     Extubated     Neuro recovered     Improved  renal function     CHF, xray reads worse though she is cumulatively fluid neg -  17,695ml (I Amiee Wiley defer to cardiology team)        Signed, Baldwin Jamaica, PA-C  07/04/2017 10:41 AM  I have seen and examined this patient with Tommye Standard.  Agree with above, note added to reflect my findings.  On exam, RRR, no murmurs, lungs clear. Admitted for post VF arrest. Had cooling protocol and now stable. Had a history of sarcoid and on prednisone. Idara Woodside plan for CMRI to rule out cardiac sarcoid. Would then benefit from ICD implant. risks and benefits discussed. Risks include but not limited to bleeding, infection, tamponade, pneumothorax. The patient understands the risks and has agreed to the procedure.    Leodis Alcocer M. Kynedi Profitt MD 07/04/2017 12:44 PM

## 2017-07-04 NOTE — Progress Notes (Signed)
Initial Nutrition Assessment  DOCUMENTATION CODES:   Morbid obesity  INTERVENTION:   -Recommend changing diet to 2g sodium   -If po intake inadequate on follow-up, recommend addition of nutrition supplement  NUTRITION DIAGNOSIS:   Inadequate oral intake related to inability to eat as evidenced by NPO status.  Improving as diet advanced post extubation, tolerating po >50%  GOAL:   Patient will meet greater than or equal to 90% of their needs  MONITOR:   PO intake, Labs, Weight trends  REASON FOR ASSESSMENT:   Consult Enteral/tube feeding initiation and management  ASSESSMENT:   56 yo female admitted with witness VF arrest, acute respiratory failure with possible aspiration pneumonia requiring intubation, started on TTM. Pt with AKI on CKD (baseline 1.15-1.25).  Pt with hx of asthma, heart failure (EF 55-60% 11/2016), HTN, questionable sarcoidosis, HL, chronic BLE ulcers, very large cystic ovarian mass, depression  7/9 Extubated  Recorded po intake >50% of meals on average, appetite improving   Weight trending down, pt net negative 17 L, euvolemic at this time per MD note  Labs: CBGs 63-141, Creatinine 1.22, potassium 4.2 (WDL), no recent phosphorus  Meds: D5-NS at 50 ml/hr, lasix, prednisone  Diet Order:  Diet renal/carb modified with fluid restriction Diet-HS Snack? Nothing; Room service appropriate? Yes; Fluid consistency: Thin  Skin:  Wound (see comment) (chronic venous stasis leg ulcers)  Last BM:  07/03/17  Height:   Ht Readings from Last 1 Encounters:  06/26/17 5\' 5"  (1.651 m)    Weight:   Wt Readings from Last 1 Encounters:  07/04/17 272 lb 6.4 oz (123.6 kg)    Ideal Body Weight:  56.8 kg  BMI:  Body mass index is 45.33 kg/m.  Estimated Nutritional Needs:   Kcal:  2200-2500 kcals   Protein:  115-130 g  Fluid:  >/= 2 L  EDUCATION NEEDS:   No education needs identified at this time  Nebo, Shiloh, LDN [phone] Pager  [phone] Weekend/On-Call Pager

## 2017-07-04 NOTE — Progress Notes (Signed)
Kari [name]  PNT:[id number] DOB: [birth year] DOA: 06/26/2017 PCP: Rosita Fire, MD    Brief Narrative:  56 year old female admitted after witnessed cardiac arrest.  She has a medical history of asthma, heart failure (11/2016 EF 55-60%) , HTN, Questionable sarcoidosis (based on mediastinal lymphadenopathy - no biopsy), HLD, obesity, chronic BLE ulcers, large cystic ovarian mass, headache and depression.  CT head was negative.  EEG with diffuse slowing.   Significant Events: 7/4 Admit in Vfib arrest - intubated  7/5 TTE - EF 40-45%  / EEG > diffuse slowing  7/6 Following some commands 7/7 Weaned 2.5 hours on PSV 7/9 extubated 7/11 TRH assumed care  Subjective: No overnight events No CP, no SOB  Assessment & Plan:  Cardiac arrest 5-10 mins of CPR before ROSC  -cardiology following S/p cath -MRI of heart pending and may need AICD per EP  Acute hypoxic respiratory failure - acute pulmonary edema  Much improved - pt no longer requiring supplemental O2  -I/Os show patient negative 17L  Aspiration PNA Has completed a course of empiric abx tx  Acute systolic CHF post arrest Care per Cardiology - appears euvolemic at this time  I/os negative 17L  Acute kidney injury Trend  Sarcoidosis  No evidence of acute flair at present  HTN BP currently well controlled  HLD  Morbid Obesity - Body mass index is 45.33 kg/m.   Chronic B LE venous ulcers  Large cystic ovarian mass diagnosed 03/2017 - surgery postponed due to AKI -outpatient follow up   DVT prophylaxis: SQ heparin  Code Status: FULL CODE Family Communication: no family present at time of exam  Disposition Plan: ambulating well so expect home once cardiac work up done  Consultants:  [facility] Card[facility] EP    Objective: Blood pressure 135/72, pulse 71, temperature 98 F (36.7 C), temperature source Oral, resp. rate 18, height 5\' 5"  (1.651 m), weight 123.6 kg (272 lb 6.4 oz), last menstrual period  11/21/2011, SpO2 99 %.  Intake/Output Summary (Last 24 hours) at 07/04/17 1302 Last data filed at 07/04/17 0856  Gross per 24 hour  Intake              840 ml  Output              950 ml  Net             -110 ml   Filed Weights   07/02/17 0457 07/03/17 0404 07/04/17 0645  Weight: 132.8 kg (292 lb 11.2 oz) 122.9 kg (270 lb 14.4 oz) 123.6 kg (272 lb 6.4 oz)    Examination: General: in bed, pleasant and cooperative Lungs: clear, no wheezing Cardiovascular: rrr Abdomen: +BS, soft Extremities: no LE edema, moves all 4 ext  CBC:  Recent Labs Lab 06/30/17 0424 07/01/17 0435 07/01/17 1702 07/01/17 2056 07/02/17 0358 07/03/17 0428 07/04/17 0359  WBC 14.5* 12.0*  --   --  15.9* 14.2* 11.4*  NEUTROABS  --   --   --   --  10.3*  --   --   HGB 12.1 11.4* 14.6 13.6 12.2 12.6 11.9*  HCT 37.6 36.7 43.0 40.0 39.5 40.4 38.4  MCV 90.6 90.4  --   --  92.9 92.0 91.2  PLT 215 216  --   --  260 259 086   Basic Metabolic Panel:  Recent Labs Lab 06/29/17 1345  06/30/17 0424 06/30/17 0430 06/30/17 1656 07/01/17 0435 07/01/17 1659 07/01/17 1702 07/01/17 2056 07/02/17 0358 07/03/17  4166 07/04/17 0359  NA  --   < > 137  --   --  135  --  142 143 137 138 138  K  --   < > 3.8  --   --  3.7  --  3.5 3.5 3.8 3.8 4.2  CL  --   < > 105  --   --  102  --  99* 102 101 100* 105  CO2  --   < > 23  --   --  26  --   --   --  29 30 28   GLUCOSE  --   < > 161*  --   --  128*  --  101* 80 78 77 112*  BUN  --   < > 38*  --   --  44*  --  40* 39* 37* 38* 34*  CREATININE  --   < > 1.62*  --   --  1.45*  --  1.40* 1.30* 1.32* 1.37* 1.22*  CALCIUM  --   < > 8.6*  --   --  8.8*  --   --   --  8.8* 9.3 8.8*  MG 2.2  --   --  2.0 2.2 2.2 2.4  --   --   --   --   --   PHOS 3.8  --   --  3.2 3.9 2.8 2.7  --   --   --   --   --   < > = values in this interval not displayed. GFR: Estimated Creatinine Clearance: 68.8 mL/min (A) (by C-G formula based on SCr of 1.22 mg/dL (H)).  Liver Function  Tests:  Recent Labs Lab 07/04/17 0359  AST 15  ALT 26  ALKPHOS 47  BILITOT 0.8  PROT 6.9  ALBUMIN 2.7*    Coagulation Profile:  Recent Labs Lab 07/03/17 0428  INR 1.19    Cardiac Enzymes: No results for input(s): CKTOTAL, CKMB, CKMBINDEX, TROPONINI in the last 168 hours.  HbA1C: Hgb A1c MFr Bld  Date/Time Value Ref Range Status  12/17/2016 08:55 PM 5.6 4.8 - 5.6 % Final    Comment:    (NOTE)         Pre-diabetes: 5.7 - 6.4         Diabetes: >6.4         Glycemic control for adults with diabetes: <7.0   03/12/2014 06:57 PM 5.5 <5.7 % Final    Comment:    (NOTE)                                                                       According to the ADA Clinical Practice Recommendations for 2011, when HbA1c is used as a screening test:  >=6.5%   Diagnostic of Diabetes Mellitus           (if abnormal result is confirmed) 5.7-6.4%   Increased risk of developing Diabetes Mellitus References:Diagnosis and Classification of Diabetes Mellitus,Diabetes AYTK,1601,09(NATFT 1):S62-S69 and Standards of Medical Care in         Diabetes - 2011,Diabetes DDUK,0254,27 (Suppl 1):S11-S61.    CBG:  Recent Labs Lab 07/03/17 1124 07/03/17 1729 07/04/17 0000 07/04/17 0747 07/04/17 1139  GLUCAP 71 81  104* 113* 141*    Recent Results (from the past 240 hour(s))  MRSA PCR Screening     Status: None   Collection Time: 06/26/17  8:30 PM  Result Value Ref Range Status   MRSA by PCR NEGATIVE NEGATIVE Final    Comment:        The GeneXpert MRSA Assay (FDA approved for NASAL specimens only), is one component of a comprehensive MRSA colonization surveillance program. It is not intended to diagnose MRSA infection nor to guide or monitor treatment for MRSA infections.   Culture, blood (routine x 2)     Status: None   Collection Time: 06/26/17  8:47 PM  Result Value Ref Range Status   Specimen Description BLOOD RIGHT ANTECUBITAL  Final   Special Requests   Final    BOTTLES  DRAWN AEROBIC ONLY Blood Culture adequate volume   Culture NO GROWTH 5 DAYS  Final   Report Status 07/01/2017 FINAL  Final  Culture, blood (routine x 2)     Status: None   Collection Time: 06/26/17  8:47 PM  Result Value Ref Range Status   Specimen Description BLOOD RIGHT HAND  Final   Special Requests   Final    BOTTLES DRAWN AEROBIC ONLY Blood Culture adequate volume   Culture NO GROWTH 5 DAYS  Final   Report Status 07/01/2017 FINAL  Final  Culture, respiratory (NON-Expectorated)     Status: None   Collection Time: 06/27/17 11:20 AM  Result Value Ref Range Status   Specimen Description TRACHEAL ASPIRATE  Final   Special Requests NONE  Final   Gram Stain   Final    ABUNDANT WBC PRESENT,BOTH PMN AND MONONUCLEAR RARE GRAM NEGATIVE RODS RARE GRAM NEGATIVE COCCI IN PAIRS    Culture Consistent with normal respiratory flora.  Final   Report Status 06/29/2017 FINAL  Final     Scheduled Meds: . arformoterol  15 mcg Nebulization BID  . aspirin  81 mg Oral Daily  . budesonide (PULMICORT) nebulizer solution  0.5 mg Nebulization BID  . carvedilol  12.5 mg Oral BID WC  . Chlorhexidine Gluconate Cloth  6 each Topical Daily  . collagenase   Topical Daily  . enoxaparin (LOVENOX) injection  60 mg Subcutaneous Q24H  . furosemide  40 mg Oral Daily  . lisinopril  5 mg Oral Daily  . mouth rinse  15 mL Mouth Rinse BID  . predniSONE  30 mg Oral Q breakfast  . sodium chloride flush  10-40 mL Intracatheter Q12H  . sodium chloride flush  3 mL Intravenous Q12H     LOS: 8 days   Eulogio Bear DO  [facility] Office  [phone] Pager - Text Page per Shea Evans as per below:  On-Call/Text Page:      [email or web address]      password [email or web address]  If 7PM-7AM, please contact night-coverage [email or web address] Password [email or web address] 07/04/2017, 1:02 PM

## 2017-07-04 NOTE — Progress Notes (Signed)
Progress Note  Patient Name: Kari [name] Date of Encounter: 07/04/2017  Primary Cardiologist: Dr. Harrington Challenger   Subjective   No complaints. No chest pain or dyspnea. Right radial cath site stable.   Inpatient Medications    Scheduled Meds: . arformoterol  15 mcg Nebulization BID  . aspirin  81 mg Oral Daily  . budesonide (PULMICORT) nebulizer solution  0.5 mg Nebulization BID  . carvedilol  12.5 mg Oral BID WC  . Chlorhexidine Gluconate Cloth  6 each Topical Daily  . collagenase   Topical Daily  . enoxaparin (LOVENOX) injection  60 mg Subcutaneous Q24H  . mouth rinse  15 mL Mouth Rinse BID  . predniSONE  30 mg Oral Q breakfast  . sodium chloride flush  10-40 mL Intracatheter Q12H  . sodium chloride flush  3 mL Intravenous Q12H   Continuous Infusions: . sodium chloride 20 mL/hr at 07/01/17 1900  . sodium chloride    . dextrose 5 % and 0.9% NaCl 50 mL/hr at 07/03/17 1245   PRN Meds: sodium chloride, acetaminophen, hydrALAZINE, ipratropium-albuterol, ondansetron (ZOFRAN) IV, oxyCODONE-acetaminophen, sodium chloride flush, sodium chloride flush   Vital Signs    Vitals:   07/03/17 1652 07/03/17 1718 07/03/17 2005 07/04/17 0645  BP: (!) 137/91 (!) 148/74 140/90 132/83  Pulse: 68 (!) 57 60 66  Resp: 18 18 18 18   Temp:  98.4 F (36.9 C) 98.3 F (36.8 C) 98.8 F (37.1 C)  TempSrc:  Oral Oral Oral  SpO2: 97% 97% 98% 100%  Weight:    272 lb 6.4 oz (123.6 kg)  Height:        Intake/Output Summary (Last 24 hours) at 07/04/17 0940 Last data filed at 07/04/17 0856  Gross per 24 hour  Intake              840 ml  Output              950 ml  Net             -110 ml   Filed Weights   07/02/17 0457 07/03/17 0404 07/04/17 0645  Weight: 292 lb 11.2 oz (132.8 kg) 270 lb 14.4 oz (122.9 kg) 272 lb 6.4 oz (123.6 kg)    Telemetry    NSR with PVCs - Personally Reviewed  ECG    NSR with PVCs 06/27/17- Personally Reviewed  Physical Exam   GEN: No acute distress.  Obese    Neck: No JVD Cardiac: RRR, no murmurs, rubs, or gallops.  Respiratory: slightly decreased BS on the right  GI: Soft, nontender, non-distended  MS: chronic venous stasis dermatitis of LEs. Neuro:  Nonfocal  Psych: Normal affect   Labs    Chemistry Recent Labs Lab 07/02/17 0358 07/03/17 0428 07/04/17 0359  NA 137 138 138  K 3.8 3.8 4.2  CL 101 100* 105  CO2 29 30 28   GLUCOSE 78 77 112*  BUN 37* 38* 34*  CREATININE 1.32* 1.37* 1.22*  CALCIUM 8.8* 9.3 8.8*  PROT  --   --  6.9  ALBUMIN  --   --  2.7*  AST  --   --  15  ALT  --   --  26  ALKPHOS  --   --  47  BILITOT  --   --  0.8  GFRNONAA 44* 43* 49*  GFRAA 52* 49* 57*  ANIONGAP 7 8 5      Hematology Recent Labs Lab 07/02/17 0358 07/03/17 0428 07/04/17 0359  WBC 15.9*  14.2* 11.4*  RBC 4.25 4.39 4.21  HGB 12.2 12.6 11.9*  HCT 39.5 40.4 38.4  MCV 92.9 92.0 91.2  MCH 28.7 28.7 28.3  MCHC 30.9 31.2 31.0  RDW 15.2 14.7 14.5  PLT 260 259 221    Cardiac EnzymesNo results for input(s): TROPONINI in the last 168 hours. No results for input(s): TROPIPOC in the last 168 hours.   BNPNo results for input(s): BNP, PROBNP in the last 168 hours.   DDimer No results for input(s): DDIMER in the last 168 hours.   Radiology    Dg Chest Port 1 View  Result Date: 07/03/2017 CLINICAL DATA:  Shortness of breath.  Hot and sweating. EXAM: PORTABLE CHEST 1 VIEW COMPARISON:  At 17:18. FINDINGS: Stable central catheter. Cardiac enlargement. Worsening aeration, developing BILATERAL pulmonary opacities most consistent with pulmonary edema. Low lung volumes. No osseous findings. IMPRESSION: Worsening aeration.  Developing pulmonary edema. Electronically Signed   By: Staci Righter M.D.   On: 07/03/2017 08:47    Cardiac Studies   2D Echo 06/27/17 Study Conclusions  - Left ventricle: Diffuse hypokinesis worse in the inferior basal wall The cavity size was moderately dilated. Wall thickness was increased in a pattern of moderate  LVH. Systolic function was mildly to moderately reduced. The estimated ejection fraction was in the range of 40% to 45%. Left ventricular diastolic function parameters were normal. - Aortic valve: There was mild regurgitation. - Mitral valve: There was moderate regurgitation. - Left atrium: The atrium was moderately dilated. - Atrial septum: No defect or patent foramen ovale was identified.  LHC 07/03/17 Procedures   Left Heart Cath and Coronary Angiography  Conclusion    Left dominant coronary anatomy.  Normal coronary arteries.  Mild to moderate global left ventricular hypo-kinesis with estimated EF 35-40%. LV end-diastolic pressure is up and normal. Combined systolic and diastolic heart failure, chronic   Recommendations:   Further management per treating team including CAD including guideline directed heart failure therapy  EP evaluation for possible AICD.   CXR 07/03/17 FINDINGS: Stable central catheter. Cardiac enlargement. Worsening aeration, developing BILATERAL pulmonary opacities most consistent with pulmonary edema. Low lung volumes. No osseous findings.  IMPRESSION: Worsening aeration.  Developing pulmonary edema.  Patient Profile     56 y.o.femalewith past medical history of sarcoid, hypertension, hyperlipidemia, diastolic congestive heart failure, asthma admitted following cardiac arrest. Patient received 5-10 minutes of CPR following ventricular fibrillation arrest. She required one defibrillation to sinus rhythm. Echocardiogram shows ejection fraction 40-45%, moderate LVH, moderate LVE, mild aortic insufficiency, moderate mitral regurgitation and moderate left atrial enlargement.  Assessment & Plan     1. Cardiac Arrest/VT: etiology unclear at this point. Occurred suddenly. Echo with mild-moderately reduced LVEF. LHC 07/03/17 showed normal coronary anatomy. EP has been consulted and will see today for consideration for ICD for secondary  prevention. She has known sarcoid. ? Cardiac envolvement, however doubt confirmation with cardiac MRI will change management. We will await EP's recommendations. Electrolytes are stable. Keep K >4 and Mg >2. Mainly NSR on telemetry with occasional PVCs over past 12 hrs. Continue BB.    2. Acute Systolic HF: I/Os net negative 17 L total since admit. Weight is down from 345 lb on admit to 272 lb. No dyspnea. However CXR yesterday showed worsening aeration. ? Developing pulmonary edema. May need to start PO lasix today.  EF 40-45% by echo. LHC excluded CAD. Continue BB. Her SCr is improving with diuresis, now at 1.2. Will need to consider addition of  ACE/ARB. Low sodium diet. Continue to monitor daily weights.   3. PAF: currently NSR on telemetry. Continue BB therapy. Monitor.   4. AKI: SCr has improved from 1.65>>1.45>>1.37>>1.2. Continue to monitor.   5. Acute Respiratory Failure: resolved. Extubated.   6. PVCs: occasional PVcs noted on telemety. Asymptomatic. Continue BB therapy with Coreg. Electrolytes are stable. Continue to monitor.   Signed, Lyda Jester, PA-C  07/04/2017, 9:40 AM    Personally seen and examined. Agree with above.  EP discussion. MRI of heart if possible - if sarcoid, would risk stratify. Starting lasix 40 QD Starting lisinopril 5mg  QD now that renal function improved and cath performed.   Candee Furbish, MD

## 2017-07-04 NOTE — Progress Notes (Signed)
Patient says she does not think she will do the procedure tomorrow (defibrillator placement) and therefore refuses to sign consent at this time.

## 2017-07-04 NOTE — Plan of Care (Signed)
Problem: Tissue Perfusion: Goal: Risk factors for ineffective tissue perfusion will decrease Outcome: Progressing Remains on room air, oxygen saturations in the mid to high 90's   Problem: Activity: Goal: Risk for activity intolerance will decrease Outcome: Progressing Sat up in chair, walked to bathroom without complaints of dizziness or shortness of breath

## 2017-07-05 ENCOUNTER — Encounter (HOSPITAL_COMMUNITY)
Admission: EM | Disposition: A | Discharge status: Home Health | Payer: Self-pay | Source: Home / Self Care | Attending: Pulmonary Disease

## 2017-07-05 ENCOUNTER — Encounter (HOSPITAL_COMMUNITY): Payer: Self-pay | Admitting: *Deleted

## 2017-07-05 DIAGNOSIS — I5021 Acute systolic (congestive) heart failure: Secondary | ICD-10-CM

## 2017-07-05 HISTORY — PX: ICD IMPLANT: EP1208

## 2017-07-05 LAB — GLUCOSE, CAPILLARY
Glucose-Capillary: 101 mg/dL — ABNORMAL HIGH (ref 65–99)
Glucose-Capillary: 110 mg/dL — ABNORMAL HIGH (ref 65–99)
Glucose-Capillary: 119 mg/dL — ABNORMAL HIGH (ref 65–99)
Glucose-Capillary: 126 mg/dL — ABNORMAL HIGH (ref 65–99)
Glucose-Capillary: 93 mg/dL (ref 65–99)

## 2017-07-05 LAB — SURGICAL PCR SCREEN
MRSA, PCR: NEGATIVE
Staphylococcus aureus: POSITIVE — AB

## 2017-07-05 SURGERY — ICD IMPLANT

## 2017-07-05 MED ORDER — SODIUM CHLORIDE 0.9 % IR SOLN
Status: AC
  Filled 2017-07-05: qty 2

## 2017-07-05 MED ORDER — LIDOCAINE HCL (PF) 1 % IJ SOLN
INTRAMUSCULAR | Status: AC
  Filled 2017-07-05: qty 30

## 2017-07-05 MED ORDER — HEPARIN (PORCINE) IN NACL 2-0.9 UNIT/ML-% IJ SOLN
INTRAMUSCULAR | Status: AC | PRN
  Administered 2017-07-05: 500 mL

## 2017-07-05 MED ORDER — IOPAMIDOL (ISOVUE-370) INJECTION 76%
INTRAVENOUS | Status: DC | PRN
  Administered 2017-07-05: 15 mL via INTRAVENOUS

## 2017-07-05 MED ORDER — ACETAMINOPHEN 325 MG PO TABS: 325.0000 mg | ORAL_TABLET | ORAL | Status: DC | PRN

## 2017-07-05 MED ORDER — LIDOCAINE HCL (PF) 1 % IJ SOLN
INTRAMUSCULAR | Status: DC | PRN
Start: 2017-07-05 — End: 2017-07-05
  Administered 2017-07-05: 75 mL

## 2017-07-05 MED ORDER — ONDANSETRON HCL 4 MG/2ML IJ SOLN: 4.0000 mg | Freq: Four times a day (QID) | INTRAMUSCULAR | Status: DC | PRN

## 2017-07-05 MED ORDER — MUPIROCIN 2 % EX OINT
1.0000 "application " | TOPICAL_OINTMENT | Freq: Two times a day (BID) | CUTANEOUS | Status: DC
  Administered 2017-07-05 – 2017-07-06 (×3): 1 via NASAL
  Filled 2017-07-05: qty 22

## 2017-07-05 MED ORDER — MIDAZOLAM HCL 5 MG/5ML IJ SOLN
INTRAMUSCULAR | Status: DC | PRN
  Administered 2017-07-05 (×3): 1 mg via INTRAVENOUS

## 2017-07-05 MED ORDER — HEPARIN (PORCINE) IN NACL 2-0.9 UNIT/ML-% IJ SOLN
INTRAMUSCULAR | Status: AC
  Filled 2017-07-05: qty 500

## 2017-07-05 MED ORDER — MIDAZOLAM HCL 5 MG/5ML IJ SOLN
INTRAMUSCULAR | Status: AC
  Filled 2017-07-05: qty 5

## 2017-07-05 MED ORDER — CEFAZOLIN SODIUM-DEXTROSE 1-4 GM/50ML-% IV SOLN
1.0000 g | Freq: Four times a day (QID) | INTRAVENOUS | Status: AC
  Administered 2017-07-05 – 2017-07-06 (×3): 1 g via INTRAVENOUS
  Filled 2017-07-05 (×3): qty 50

## 2017-07-05 MED ORDER — FENTANYL CITRATE (PF) 100 MCG/2ML IJ SOLN
INTRAMUSCULAR | Status: DC | PRN
  Administered 2017-07-05 (×2): 25 ug via INTRAVENOUS

## 2017-07-05 MED ORDER — FENTANYL CITRATE (PF) 100 MCG/2ML IJ SOLN
INTRAMUSCULAR | Status: AC
  Filled 2017-07-05: qty 2

## 2017-07-05 SURGICAL SUPPLY — 7 items
CABLE SURGICAL S-101-97-12 (CABLE) ×2 IMPLANT
HOVERMATT SINGLE USE (MISCELLANEOUS) ×2 IMPLANT
ICD ELLIPSE VR CD1411-36Q (ICD Generator) ×2 IMPLANT
LEAD DURATA 7122Q-65CM (Lead) ×2 IMPLANT
PAD DEFIB LIFELINK (PAD) ×2 IMPLANT
SHEATH CLASSIC 7F (SHEATH) ×2 IMPLANT
TRAY PACEMAKER INSERTION (PACKS) ×2 IMPLANT

## 2017-07-05 NOTE — Progress Notes (Signed)
Progress Note  Patient Name: Kari [name] Date of Encounter: 07/05/2017  Primary Cardiologist: Dr. Harrington Challenger  Subjective   No complaints today. Awaiting ICD placement today.   Inpatient Medications    Scheduled Meds: . arformoterol  15 mcg Nebulization BID  . aspirin  81 mg Oral Daily  . budesonide (PULMICORT) nebulizer solution  0.5 mg Nebulization BID  . carvedilol  12.5 mg Oral BID WC  . Chlorhexidine Gluconate Cloth  6 each Topical Daily  . collagenase   Topical Daily  . enoxaparin (LOVENOX) injection  60 mg Subcutaneous Q24H  . furosemide  40 mg Oral Daily  . gentamicin irrigation  80 mg Irrigation To SSTC  . lisinopril  5 mg Oral Daily  . mouth rinse  15 mL Mouth Rinse BID  . predniSONE  30 mg Oral Q breakfast  . sodium chloride flush  10-40 mL Intracatheter Q12H  . sodium chloride flush  3 mL Intravenous Q12H  . sodium chloride flush  3 mL Intravenous Q12H   Continuous Infusions: . sodium chloride 20 mL/hr at 07/01/17 1900  . sodium chloride    . sodium chloride 50 mL/hr at 07/05/17 0614  . sodium chloride    .  ceFAZolin (ANCEF) IV     PRN Meds: sodium chloride, acetaminophen, hydrALAZINE, ipratropium-albuterol, ondansetron (ZOFRAN) IV, oxyCODONE-acetaminophen, sodium chloride flush, sodium chloride flush, sodium chloride flush   Vital Signs    Vitals:   07/04/17 2239 07/05/17 0322 07/05/17 0403 07/05/17 0726  BP: (!) 149/80  108/67   Pulse: 65  65   Resp: 20  20   Temp: 98.5 F (36.9 C)  98.1 F (36.7 C)   TempSrc: Oral  Oral   SpO2: 93%  97% 99%  Weight:  266 lb 6.4 oz (120.8 kg)    Height:        Intake/Output Summary (Last 24 hours) at 07/05/17 0836 Last data filed at 07/05/17 0655  Gross per 24 hour  Intake           154.17 ml  Output              300 ml  Net          -145.83 ml   Filed Weights   07/03/17 0404 07/04/17 0645 07/05/17 0322  Weight: 270 lb 14.4 oz (122.9 kg) 272 lb 6.4 oz (123.6 kg) 266 lb 6.4 oz (120.8 kg)    Telemetry     NSR with occasional PVCs - Personally Reviewed  ECG    NSR with PVCs - Personally Reviewed  Physical Exam   GEN: No acute distress.  Obese Neck: No JVD Cardiac: RRR, no murmurs, rubs, or gallops.  Respiratory: Clear to auscultation bilaterally. GI: Soft, nontender, non-distended  MS: No edema; chronic venous stasis dermatitis of bilateral LEs Neuro:  Nonfocal  Psych: Normal affect   Labs    Chemistry Recent Labs Lab 07/02/17 0358 07/03/17 0428 07/04/17 0359  NA 137 138 138  K 3.8 3.8 4.2  CL 101 100* 105  CO2 29 30 28   GLUCOSE 78 77 112*  BUN 37* 38* 34*  CREATININE 1.32* 1.37* 1.22*  CALCIUM 8.8* 9.3 8.8*  PROT  --   --  6.9  ALBUMIN  --   --  2.7*  AST  --   --  15  ALT  --   --  26  ALKPHOS  --   --  47  BILITOT  --   --  0.8  GFRNONAA 44* 43* 49*  GFRAA 52* 49* 57*  ANIONGAP 7 8 5      Hematology Recent Labs Lab 07/02/17 0358 07/03/17 0428 07/04/17 0359  WBC 15.9* 14.2* 11.4*  RBC 4.25 4.39 4.21  HGB 12.2 12.6 11.9*  HCT 39.5 40.4 38.4  MCV 92.9 92.0 91.2  MCH 28.7 28.7 28.3  MCHC 30.9 31.2 31.0  RDW 15.2 14.7 14.5  PLT 260 259 221    Cardiac EnzymesNo results for input(s): TROPONINI in the last 168 hours. No results for input(s): TROPIPOC in the last 168 hours.   BNPNo results for input(s): BNP, PROBNP in the last 168 hours.   DDimer No results for input(s): DDIMER in the last 168 hours.   Radiology    Mr Cardiac Morphology W Wo Contrast  Result Date: 07/05/2017 CLINICAL DATA:  Sarcoid, VF EXAM: CARDIAC MRI TECHNIQUE: The patient was scanned on a 1.5 Tesla GE magnet. A dedicated cardiac coil was used. Functional imaging was done using Fiesta sequences. 2,3, and 4 chamber views were done to assess for RWMA's. Modified Simpson's rule using a short axis stack was used to calculate an ejection fraction on a dedicated work Conservation officer, nature. The patient received 35 cc of Multihance. After 10 minutes inversion recovery sequences  were used to assess for infiltration and scar tissue. CONTRAST:  35 cc Multihance FINDINGS: There was moderate LAE. RA/RV were normal. There was moderate MR. The aortic valve was tri-leaflet and moderately thickened. The aortic root was normal 3.4 cm. There was no ASD/PFO or VSD There was no pericardial effusion. There was moderate LVE. The inferior base and posterior lateral walls were somewhat thinned and hypokinetic. The quantitative EF was 50% (EDV 199 ESV 99 SV 100) Delayed enhancement images with gadolinium showed no hyperenhancement, scar, infiltration or evidence of sarcoid IMPRESSION: 1) Moderate LVE with inferior basal, posterior lateral hypokinesis EF 50% 2) LVH septal thickness 14 mm 3) No delayed hyperenhancement on inversion recovery sequences post gadolinium 4) Moderate LAE 5) Moderate MR 6) Normal RV Jenkins Rouge Electronically Signed   By: Jenkins Rouge M.D.   On: 07/05/2017 07:57    Cardiac Studies   2D Echo 06/27/17 Study Conclusions  - Left ventricle: Diffuse hypokinesis worse in the inferior basal wall The cavity size was moderately dilated. Wall thickness was increased in a pattern of moderate LVH. Systolic function was mildly to moderately reduced. The estimated ejection fraction was in the range of 40% to 45%. Left ventricular diastolic function parameters were normal. - Aortic valve: There was mild regurgitation. - Mitral valve: There was moderate regurgitation. - Left atrium: The atrium was moderately dilated. - Atrial septum: No defect or patent foramen ovale was identified.  LHC 07/03/17 Procedures   Left Heart Cath and Coronary Angiography  Conclusion    Left dominant coronary anatomy.  Normal coronary arteries.  Mild to moderate global left ventricular hypo-kinesis with estimated EF 35-40%. LV end-diastolic pressure is up and normal. Combined systolic and diastolic heart failure, chronic   Recommendations:   Further management per  treating team including CAD including guideline directed heart failure therapy  EP evaluation for possible AICD.   Cardiac MRI 07/04/17 FINDINGS: There was moderate LAE. RA/RV were normal. There was moderate MR. The aortic valve was tri-leaflet and moderately thickened. The aortic root was normal 3.4 cm. There was no ASD/PFO or VSD There was no pericardial effusion. There was moderate LVE. The inferior base and posterior lateral walls were somewhat thinned and hypokinetic. The  quantitative EF was 50% (EDV 199 ESV 99 SV 100) Delayed enhancement images with gadolinium showed no hyperenhancement, scar, infiltration or evidence of sarcoid  IMPRESSION: 1) Moderate LVE with inferior basal, posterior lateral hypokinesis EF 50%  2) LVH septal thickness 14 mm  3) No delayed hyperenhancement on inversion recovery sequences post gadolinium  4) Moderate LAE  5) Moderate MR  6) Normal RV   Patient Profile   56 y.o.femalewith past medical history of sarcoid, hypertension, hyperlipidemia, diastolic congestive heart failure, asthma admitted following cardiac arrest. Patient received 5-10 minutes of CPR following ventricular fibrillation arrest. She required one defibrillation to sinus rhythm. Echocardiogram shows ejection fraction 40-45%, moderate LVH, moderate LVE, mild aortic insufficiency, moderate mitral regurgitation and moderate left atrial enlargement.  Assessment & Plan    1. Cardiac Arrest/VT:etiology unclear at this point. Occurred suddenly. Echo with mild-moderately reduced LVEF. LHC 07/03/17 showed normal coronary anatomy. Cardiac MRI w/o evidence of infiltrative disease or sarcoid. EP has been consulted and has recommended ICD, scheduled for today. Electrolytes are stable. Keep K >4 and Mg >2. Mainly NSR on telemetry with occasional PVCs over past 12 hrs. Continue BB.   2. Acute Systolic HF: I/Os net negative 17.5 L total since admit. Weight is down from 345 lb on admit  to 266 lb. Breathing is stable. No dyspnea. EF 40-45% by echo. LHC excluded CAD. Continue BB. Her SCr is improving with diuresis, now at 1.2. On ACE. Low sodium diet. Continue to monitor daily weights.  3. PAF: currently NSR on telemetry. Continue BB therapy. Monitor.   4. AKI: SCr has improved from 1.65>>1.45>>1.37>>1.2.Continue to monitor.   5. Acute Respiratory Failure: resolved. Extubated.   6. PVCs: occasional PVcs noted on telemety. Asymptomatic. Continue BB therapy with Coreg. Electrolytes are stable. Continue to monitor.   Dispo: possible d/c tomorrow if no complications following ICD placement.    Signed, Lyda Jester, PA-C  07/05/2017, 8:36 AM    Personally seen and examined. Agree with above.  ICD today No CP, no SOB Alert, obese VT/cardiac arrest EF 40% - ACE, BB DC likely tomorrow  Candee Furbish, MD

## 2017-07-05 NOTE — Progress Notes (Addendum)
Progress Note  Patient Name: Kari [name] Date of Encounter: 07/05/2017  Primary Cardiologist: Marlou Porch  Subjective   Feeling well without complaint. Continues emotional lability.   Inpatient Medications    Scheduled Meds: . arformoterol  15 mcg Nebulization BID  . aspirin  81 mg Oral Daily  . budesonide (PULMICORT) nebulizer solution  0.5 mg Nebulization BID  . carvedilol  12.5 mg Oral BID WC  . Chlorhexidine Gluconate Cloth  6 each Topical Daily  . collagenase   Topical Daily  . enoxaparin (LOVENOX) injection  60 mg Subcutaneous Q24H  . furosemide  40 mg Oral Daily  . gentamicin irrigation  80 mg Irrigation To SSTC  . lisinopril  5 mg Oral Daily  . mouth rinse  15 mL Mouth Rinse BID  . predniSONE  30 mg Oral Q breakfast  . sodium chloride flush  10-40 mL Intracatheter Q12H  . sodium chloride flush  3 mL Intravenous Q12H  . sodium chloride flush  3 mL Intravenous Q12H   Continuous Infusions: . sodium chloride 20 mL/hr at 07/01/17 1900  . sodium chloride    . sodium chloride 50 mL/hr at 07/05/17 0614  . sodium chloride    .  ceFAZolin (ANCEF) IV     PRN Meds: sodium chloride, acetaminophen, hydrALAZINE, ipratropium-albuterol, ondansetron (ZOFRAN) IV, oxyCODONE-acetaminophen, sodium chloride flush, sodium chloride flush, sodium chloride flush   Vital Signs    Vitals:   07/04/17 2239 07/05/17 0322 07/05/17 0403 07/05/17 0726  BP: (!) 149/80  108/67   Pulse: 65  65   Resp: 20  20   Temp: 98.5 F (36.9 C)  98.1 F (36.7 C)   TempSrc: Oral  Oral   SpO2: 93%  97% 99%  Weight:  266 lb 6.4 oz (120.8 kg)    Height:        Intake/Output Summary (Last 24 hours) at 07/05/17 0750 Last data filed at 07/05/17 0655  Gross per 24 hour  Intake           154.17 ml  Output              300 ml  Net          -145.83 ml   Filed Weights   07/03/17 0404 07/04/17 0645 07/05/17 0322  Weight: 270 lb 14.4 oz (122.9 kg) 272 lb 6.4 oz (123.6 kg) 266 lb 6.4 oz (120.8 kg)     Telemetry    Sinus rhythm - Personally Reviewed  ECG    None recent - Personally Reviewed  Physical Exam   GEN: No acute distress.   Neck: No JVD Cardiac: RRR, no murmurs, rubs, or gallops.  Respiratory: Clear to auscultation bilaterally. GI: Soft, nontender, non-distended  MS: No edema; No deformity. Neuro:  Nonfocal  Psych: Normal affect   Labs    Chemistry Recent Labs Lab 07/02/17 0358 07/03/17 0428 07/04/17 0359  NA 137 138 138  K 3.8 3.8 4.2  CL 101 100* 105  CO2 29 30 28   GLUCOSE 78 77 112*  BUN 37* 38* 34*  CREATININE 1.32* 1.37* 1.22*  CALCIUM 8.8* 9.3 8.8*  PROT  --   --  6.9  ALBUMIN  --   --  2.7*  AST  --   --  15  ALT  --   --  26  ALKPHOS  --   --  47  BILITOT  --   --  0.8  GFRNONAA 44* 43* 49*  GFRAA 52* 49* 57*  ANIONGAP 7 8 5      Hematology Recent Labs Lab 07/02/17 0358 07/03/17 0428 07/04/17 0359  WBC 15.9* 14.2* 11.4*  RBC 4.25 4.39 4.21  HGB 12.2 12.6 11.9*  HCT 39.5 40.4 38.4  MCV 92.9 92.0 91.2  MCH 28.7 28.7 28.3  MCHC 30.9 31.2 31.0  RDW 15.2 14.7 14.5  PLT 260 259 221    Cardiac EnzymesNo results for input(s): TROPONINI in the last 168 hours. No results for input(s): TROPIPOC in the last 168 hours.   BNPNo results for input(s): BNP, PROBNP in the last 168 hours.   DDimer No results for input(s): DDIMER in the last 168 hours.   Radiology    No results found.  Cardiac Studies   TTE - Left ventricle: Diffuse hypokinesis worse in the inferior basal   wall The cavity size was moderately dilated. Wall thickness was   increased in a pattern of moderate LVH. Systolic function was   mildly to moderately reduced. The estimated ejection fraction was   in the range of 40% to 45%. Left ventricular diastolic function   parameters were normal. - Aortic valve: There was mild regurgitation. - Mitral valve: There was moderate regurgitation. - Left atrium: The atrium was moderately dilated. - Atrial septum: No defect or  patent foramen ovale was identified.  Patient Profile     56 y.o. female with a hx of sarcoidosis, HTN, HLD, HFpEF, a known laerge ovarian mass was in the process of getting referred for surgical management, venous stasis, no known cardiac/coronary history.  She was treated in April for venous stasis ulcers and LE cellulitis and noted ovarian mass surgery was cancelled 2/2 electrolyte imbalance.  admitted 06/26/17 with VF arrest.  Assessment & Plan    1. VF arrest     Plan for ICD implant today. Risks and benefits discussed. Risks include but not limited to bleeding, infection, tamponade, pneumothorax. The patient understands the risks and has agreed to the procedure. No driving for 6 months per [place] law.  2. Sarcoidosis      Unclear if heart is involved. MRI read pending. May require further immunosuppression if cardiac involvement.  3. Paroxysmal atrial fibrillation: sinus rhythm on telemetry. No anticoagulation unless further AF.  4. Acute systolic HF: out 14.4R since admission. On OMT.  Signed, Rimsha Trembley Meredith Leeds, MD  07/05/2017, 7:50 AM

## 2017-07-05 NOTE — Progress Notes (Signed)
ICD Criteria  Current LVEF:40-45%. Within 12 months prior to implant: Yes   Heart failure history: Yes, Class II  Cardiomyopathy history: Yes, Non-Ischemic Cardiomyopathy.  Atrial Fibrillation/Atrial Flutter: Yes, Paroxysmal.  Ventricular tachycardia history: No.  Cardiac arrest history: Yes, Ventricular Fibrillation.  History of syndromes with risk of sudden death: No.  Previous ICD: No.  Current ICD indication: Secondary  PPM indication: No.   Class I or II Bradycardia indication present: No  Beta Blocker therapy for 3 or more months: Yes, prescribed.   Ace Inhibitor/ARB therapy for 3 or more months: Yes, prescribed.

## 2017-07-05 NOTE — Progress Notes (Signed)
Physical Therapy Treatment Patient Details Name: Kari [name] MRN: [id number] DOB: 1961-10-15 Today's Date: 07/05/2017    History of Present Illness 56 year old black female who experienced cardiac arrest, patient received 5-10 minutes of CPR following ventricular fibrillation arrest. She required one defibrillation to sinus rhythm. Echocardiogram shows ejection fraction 40-45%, moderate LVH, moderate LVE, mild aortic insufficiency, moderate mitral regurgitation and moderate left atrial enlargement. PMH for CHF, sarcoidosis, renal insufficiency, hypertension, and history of chronic stasis disease of lower extremities.     PT Comments    Pt demonstrates good tolerance for gait training this session. Pt is able to perform increased gait distance with continued cues for proximity to RW and environmental awareness as pt does become distracted by environment. Pt continues to benefit from HHPT at discharge in order to maximize outcomes.     Follow Up Recommendations  Home health PT;Supervision/Assistance - 24 hour     Equipment Recommendations  Rolling walker with 5" wheels;3in1 (PT)    Recommendations for Other Services       Precautions / Restrictions Precautions Precautions: Fall Restrictions Weight Bearing Restrictions: No    Mobility  Bed Mobility               General bed mobility comments: sitting up in recliner when PT arrives  Transfers Overall transfer level: Needs assistance Equipment used: Rolling walker (2 wheeled) Transfers: Sit to/from Stand Sit to Stand: Min assist         General transfer comment: Min A from lower surface to power up to RW  Ambulation/Gait Ambulation/Gait assistance: Min guard Ambulation Distance (Feet): 700 Feet Assistive device: Rolling walker (2 wheeled) Gait Pattern/deviations: Step-through pattern;Shuffle;Trunk flexed Gait velocity: decreased Gait velocity interpretation: Below normal speed for age/gender General Gait  Details: min guard for safety, vc for staying up inside walker, and for management of walker around obstacles in hall. pt with steady slow cadence, no LoB.   Stairs            Wheelchair Mobility    Modified Rankin (Stroke Patients Only)       Balance Overall balance assessment: Needs assistance Sitting-balance support: Feet supported;No upper extremity supported Sitting balance-Leahy Scale: Good     Standing balance support: Bilateral upper extremity supported Standing balance-Leahy Scale: Poor Standing balance comment: reliant on RW for stability in standing                            Cognition Arousal/Alertness: Awake/alert Behavior During Therapy: WFL for tasks assessed/performed Overall Cognitive Status: Within Functional Limits for tasks assessed                                        Exercises      General Comments        Pertinent Vitals/Pain Pain Assessment: No/denies pain    Home Living                      Prior Function            PT Goals (current goals can now be found in the care plan section) Acute Rehab PT Goals Patient Stated Goal: go home Progress towards PT goals: Progressing toward goals    Frequency    Min 3X/week      PT Plan Current plan remains appropriate    Co-evaluation  AM-PAC PT "6 Clicks" Daily Activity  Outcome Measure  Difficulty turning over in bed (including adjusting bedclothes, sheets and blankets)?: None Difficulty moving from lying on back to sitting on the side of the bed? : None Difficulty sitting down on and standing up from a chair with arms (e.g., wheelchair, bedside commode, etc,.)?: Total Help needed moving to and from a bed to chair (including a wheelchair)?: A Little Help needed walking in hospital room?: A Little Help needed climbing 3-5 steps with a railing? : A Lot 6 Click Score: 17    End of Session Equipment Utilized During  Treatment: Gait belt Activity Tolerance: Patient tolerated treatment well Patient left: in chair;with call bell/phone within reach;with chair alarm set;with nursing/sitter in room Nurse Communication: Mobility status PT Visit Diagnosis: Unsteadiness on feet (R26.81);Other abnormalities of gait and mobility (R26.89);Muscle weakness (generalized) (M62.81);Difficulty in walking, not elsewhere classified (R26.2)     Time: 0272-5366 PT Time Calculation (min) (ACUTE ONLY): 21 min  Charges:  $Gait Training: 8-22 mins                    G Codes:       Scheryl Marten PT, DPT  [phone]    Shanon Rosser 07/05/2017, 12:50 PM

## 2017-07-05 NOTE — Progress Notes (Signed)
Tech offered Pt a bath. Pt request to wait until later. Tech will recheck with Pt regarding bath.

## 2017-07-05 NOTE — Progress Notes (Signed)
PROGRESS NOTE    Kari [name]  IWP:[id number] DOB: Oct 19, 1961 DOA: 06/26/2017 PCP: Rosita Fire, MD   No chief complaint on file.   Brief Narrative:  HPI on 06/26/2017 by Dr. Sheral Apley, PCCM Patient is intubated and unresponsive, therefore HPI obtained from medical chart review.  56 year old female with past medical history significant for asthma, heart failure (11/2016 EF 55-60%) , HTN, Questionable sarcoidosis (based on mediastinal lymphadenopathy no biopsy), HLD, obesity, chronic BLE ulcers, large cystic ovarian mass, headache and depression who presented to [facility] ED on 7/4 in cardiac arrest around 1455.   ED staff state that they have contacted her family.  Questionably patient lives with her brother who is traveling back from [place].  Patient also has a sister who was present at [facility] and unaware of any recent complaints from patient.  Reportedly patient had asked a stranger for a ride from the [employer] store and went unresponsive in the car.  Bystander and FD CPR was started.  EMS arrived and given one epinephrine and one defibrillation for v-fib.  Estimated 5 -10 mins of CPR were given prior to ROSC.  King airway was placed and patient transferred to the ED where patient remained unresponsive not requiring any sedation.  Hypotensive treated with 3.5 L NS bolus.  Labs noted for glucose 112, Na 138, K 3.1, CO2 19, sCr 1.39 (previously 1.17 on 5/18), BUN 22, AST 160, ALT 108, albumin 3.2, troponin I poc 0.01, Lactic acid 6.91, WBC 8.5, Hgb 11.2, UDS neg, ABG 7.32/39.3/270 on 60% FiO2.  CXR shows pulmonary vascular congestion. EKG non acute.  Definitive airway placed in ED.  Head CT negative. Patient to be transferred to [facility] for targeted temperature management, PCCM to admit.   Interim history Patient was admitted with cardiac arrest, status post catheterization. Cardiology following. Plan for AICD today. Assessment & Plan   Cardiac arrest -5-10 minutes of CPR before ROS  see -Cardiology consulted and appreciated -Echocardiogram showed EF of 40-45% -Status post catheterization, results below -Cardiac MRI results below -EP consulted and appreciated, plan for AICD placement today -Continue aspirin  Acute hypoxic respiratory failure/acute pulmonary edema -Patient did require intubation and extubation -No longer on supplemental oxygen, currently satting upper 90s in room air -Patient diuresis, -17 L  Acute systolic/diastolic heart failure -Status post arrest -Monitor intake and output, daily weights -Currently appears to be euvolemic -Continue Lasix, Coreg, lisinopril  Aspiration pneumonia -Completed course of empiric antibiotics  Chronic kidney disease, stage III -After review of Epic, patient's kidney function in 2017 was actually worse, she was stage IV at that time and has since improved -GFR currently in stage III, creatinine stable  Sarcoidosis -No evidence of acute flare -Continue prednisone  Essential hypertension -Continue Coreg, lisinopril  Chronic bilateral lower extremity venous ulcers -Continue wound care  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 Surgery was postponed due to acute kidney injury -Patiently outpatient follow-up  Deconditioning -PT consulted and recommended home health -Will discharge patient with home health services, physical therapy and nursing  DVT Prophylaxis  SCDs, heparin held for AICD placment  Code Status: Full  Family Communication: None at bedside  Disposition Plan: Admitted. Pending AICD placement later today. Likely home on 7/14  Consultants PCCM Cardiology EP  Procedures / significant events 7/4 Admitted for V. fib arrest, intubated 7/5 echocardiogram showed EF 40-45% 7/5 EEG showed diffuse slowing 7/9 extubated 7/11 left heart catheterization: Left dominant coronary anatomy, normal coronary arteries, mild to moderate global  left ventricular hypokinesis, EF 35-40%. Chronic  combined systolic and diastolic heart failure. EP evaluation for ICD placement 7/12 Cardiac MRI: Moderate LVE with inferior basal, posterior lateral hypokinesis EF 50%. Moderate LAE, moderate MR, normal RV 7/13 ICD placement  Antibiotics   Anti-infectives    Start     Dose/Rate Route Frequency Ordered Stop   07/05/17 1500  gentamicin (GARAMYCIN) 80 mg in sodium chloride irrigation 0.9 % 500 mL irrigation     80 mg Irrigation To Short Stay 07/04/17 2229 07/06/17 1500   07/05/17 1500  ceFAZolin (ANCEF) 3 g in dextrose 5 % 50 mL IVPB     3 g 130 mL/hr over 30 Minutes Intravenous To Short Stay 07/04/17 2229 07/06/17 1500   06/30/17 2200  vancomycin (VANCOCIN) 1,500 mg in sodium chloride 0.9 % 500 mL IVPB  Status:  Discontinued     1,500 mg 250 mL/hr over 120 Minutes Intravenous Every 48 hours 06/29/17 1005 06/29/17 1306   06/27/17 0800  vancomycin (VANCOCIN) 1,250 mg in sodium chloride 0.9 % 250 mL IVPB  Status:  Discontinued     1,250 mg 166.7 mL/hr over 90 Minutes Intravenous Every 12 hours 06/26/17 2146 06/29/17 0949   06/26/17 2200  piperacillin-tazobactam (ZOSYN) IVPB 3.375 g     3.375 g 12.5 mL/hr over 240 Minutes Intravenous Every 8 hours 06/26/17 2143 07/01/17 2359   06/26/17 2200  vancomycin (VANCOCIN) 2,000 mg in sodium chloride 0.9 % 500 mL IVPB     2,000 mg 250 mL/hr over 120 Minutes Intravenous  Once 06/26/17 2145 06/27/17 0030      Subjective:   [employer] seen and examined today.  Patient feeling much better today. Denies chest pain, shortness of breath, abdominal pain, nausea or vomiting, diarrhea or constipation. Patient states she is ready to go home.  Objective:   Vitals:   07/05/17 0322 07/05/17 0403 07/05/17 0726 07/05/17 1232  BP:  108/67  (!) 130/109  Pulse:  65  68  Resp:  20  20  Temp:  98.1 F (36.7 C)  98.6 F (37 C)  TempSrc:  Oral  Oral  SpO2:  97% 99% 100%  Weight: 120.8 kg (266 lb 6.4 oz)     Height:        Intake/Output Summary (Last 24  hours) at 07/05/17 1328 Last data filed at 07/05/17 1041  Gross per 24 hour  Intake            94.17 ml  Output              300 ml  Net          -205.83 ml   Filed Weights   07/03/17 0404 07/04/17 0645 07/05/17 0322  Weight: 122.9 kg (270 lb 14.4 oz) 123.6 kg (272 lb 6.4 oz) 120.8 kg (266 lb 6.4 oz)    Exam  General: Well developed, well nourished, NAD, appears stated age  44: NCAT,mucous membranes moist.   Neck: Supple, no JVD, no masses  Cardiovascular: S1 S2 auscultated, no rubs, murmurs or gallops. Regular rate and rhythm.  Respiratory: Clear to auscultation bilaterally with equal chest rise  Abdomen: Soft, obese, nontender, nondistended, + bowel sounds  Extremities: warm dry without cyanosis clubbing or edema, Dressing on LE B/L   Neuro: AAOx3, nonfocal  Psych: Appropriate   Data Reviewed: I have personally reviewed following labs and imaging studies  CBC:  Recent Labs Lab 06/30/17 0424 07/01/17 0435 07/01/17 1702 07/01/17 2056 07/02/17 0960 07/03/17 0428 07/04/17 4540  WBC 14.5* 12.0*  --   --  15.9* 14.2* 11.4*  NEUTROABS  --   --   --   --  10.3*  --   --   HGB 12.1 11.4* 14.6 13.6 12.2 12.6 11.9*  HCT 37.6 36.7 43.0 40.0 39.5 40.4 38.4  MCV 90.6 90.4  --   --  92.9 92.0 91.2  PLT 215 216  --   --  260 259 025   Basic Metabolic Panel:  Recent Labs Lab 06/29/17 1345  06/30/17 0424 06/30/17 0430 06/30/17 1656 07/01/17 0435 07/01/17 1659 07/01/17 1702 07/01/17 2056 07/02/17 0358 07/03/17 0428 07/04/17 0359  NA  --   < > 137  --   --  135  --  142 143 137 138 138  K  --   < > 3.8  --   --  3.7  --  3.5 3.5 3.8 3.8 4.2  CL  --   < > 105  --   --  102  --  99* 102 101 100* 105  CO2  --   < > 23  --   --  26  --   --   --  29 30 28   GLUCOSE  --   < > 161*  --   --  128*  --  101* 80 78 77 112*  BUN  --   < > 38*  --   --  44*  --  40* 39* 37* 38* 34*  CREATININE  --   < > 1.62*  --   --  1.45*  --  1.40* 1.30* 1.32* 1.37* 1.22*  CALCIUM   --   < > 8.6*  --   --  8.8*  --   --   --  8.8* 9.3 8.8*  MG 2.2  --   --  2.0 2.2 2.2 2.4  --   --   --   --   --   PHOS 3.8  --   --  3.2 3.9 2.8 2.7  --   --   --   --   --   < > = values in this interval not displayed. GFR: Estimated Creatinine Clearance: 67.9 mL/min (A) (by C-G formula based on SCr of 1.22 mg/dL (H)). Liver Function Tests:  Recent Labs Lab 07/04/17 0359  AST 15  ALT 26  ALKPHOS 47  BILITOT 0.8  PROT 6.9  ALBUMIN 2.7*   No results for input(s): LIPASE, AMYLASE in the last 168 hours. No results for input(s): AMMONIA in the last 168 hours. Coagulation Profile:  Recent Labs Lab 07/03/17 0428  INR 1.19   Cardiac Enzymes: No results for input(s): CKTOTAL, CKMB, CKMBINDEX, TROPONINI in the last 168 hours. BNP (last 3 results) No results for input(s): PROBNP in the last 8760 hours. HbA1C: No results for input(s): HGBA1C in the last 72 hours. CBG:  Recent Labs Lab 07/04/17 1624 07/05/17 0021 07/05/17 0433 07/05/17 0732 07/05/17 1226  GLUCAP 165* 126* 101* 93 110*   Lipid Profile: No results for input(s): CHOL, HDL, LDLCALC, TRIG, CHOLHDL, LDLDIRECT in the last 72 hours. Thyroid Function Tests: No results for input(s): TSH, T4TOTAL, FREET4, T3FREE, THYROIDAB in the last 72 hours. Anemia Panel: No results for input(s): VITAMINB12, FOLATE, FERRITIN, TIBC, IRON, RETICCTPCT in the last 72 hours. Urine analysis:    Component Value Date/Time   COLORURINE YELLOW 06/26/2017 1520   APPEARANCEUR CLOUDY (A) 06/26/2017 1520   LABSPEC 1.018 06/26/2017 1520  PHURINE 6.0 06/26/2017 1520   GLUCOSEU 150 (A) 06/26/2017 1520   HGBUR MODERATE (A) 06/26/2017 1520   BILIRUBINUR NEGATIVE 06/26/2017 1520   KETONESUR NEGATIVE 06/26/2017 1520   PROTEINUR >=300 (A) 06/26/2017 1520   UROBILINOGEN 0.2 04/10/2014 0949   NITRITE NEGATIVE 06/26/2017 1520   LEUKOCYTESUR NEGATIVE 06/26/2017 1520   Sepsis Labs: @LABRCNTIP (procalcitonin:4,lacticidven:4)  ) Recent  Results (from the past 240 hour(s))  MRSA PCR Screening     Status: None   Collection Time: 06/26/17  8:30 PM  Result Value Ref Range Status   MRSA by PCR NEGATIVE NEGATIVE Final    Comment:        The GeneXpert MRSA Assay (FDA approved for NASAL specimens only), is one component of a comprehensive MRSA colonization surveillance program. It is not intended to diagnose MRSA infection nor to guide or monitor treatment for MRSA infections.   Culture, blood (routine x 2)     Status: None   Collection Time: 06/26/17  8:47 PM  Result Value Ref Range Status   Specimen Description BLOOD RIGHT ANTECUBITAL  Final   Special Requests   Final    BOTTLES DRAWN AEROBIC ONLY Blood Culture adequate volume   Culture NO GROWTH 5 DAYS  Final   Report Status 07/01/2017 FINAL  Final  Culture, blood (routine x 2)     Status: None   Collection Time: 06/26/17  8:47 PM  Result Value Ref Range Status   Specimen Description BLOOD RIGHT HAND  Final   Special Requests   Final    BOTTLES DRAWN AEROBIC ONLY Blood Culture adequate volume   Culture NO GROWTH 5 DAYS  Final   Report Status 07/01/2017 FINAL  Final  Culture, respiratory (NON-Expectorated)     Status: None   Collection Time: 06/27/17 11:20 AM  Result Value Ref Range Status   Specimen Description TRACHEAL ASPIRATE  Final   Special Requests NONE  Final   Gram Stain   Final    ABUNDANT WBC PRESENT,BOTH PMN AND MONONUCLEAR RARE GRAM NEGATIVE RODS RARE GRAM NEGATIVE COCCI IN PAIRS    Culture Consistent with normal respiratory flora.  Final   Report Status 06/29/2017 FINAL  Final  Surgical pcr screen     Status: Abnormal   Collection Time: 07/05/17 12:55 AM  Result Value Ref Range Status   MRSA, PCR NEGATIVE NEGATIVE Final   Staphylococcus aureus POSITIVE (A) NEGATIVE Final    Comment:        The Xpert SA Assay (FDA approved for NASAL specimens in patients over 63 years of age), is one component of a comprehensive surveillance program.   Test performance has been validated by [facility] for patients greater than or equal to 13 year old. It is not intended to diagnose infection nor to guide or monitor treatment.       Radiology Studies: Mr Cardiac Morphology W Wo Contrast  Result Date: 07/05/2017 CLINICAL DATA:  Sarcoid, VF EXAM: CARDIAC MRI TECHNIQUE: The patient was scanned on a 1.5 Tesla GE magnet. A dedicated cardiac coil was used. Functional imaging was done using Fiesta sequences. 2,3, and 4 chamber views were done to assess for RWMA's. Modified Simpson's rule using a short axis stack was used to calculate an ejection fraction on a dedicated work Conservation officer, nature. The patient received 35 cc of Multihance. After 10 minutes inversion recovery sequences were used to assess for infiltration and scar tissue. CONTRAST:  35 cc Multihance FINDINGS: There was moderate LAE. RA/RV were normal. There  was moderate MR. The aortic valve was tri-leaflet and moderately thickened. The aortic root was normal 3.4 cm. There was no ASD/PFO or VSD There was no pericardial effusion. There was moderate LVE. The inferior base and posterior lateral walls were somewhat thinned and hypokinetic. The quantitative EF was 50% (EDV 199 ESV 99 SV 100) Delayed enhancement images with gadolinium showed no hyperenhancement, scar, infiltration or evidence of sarcoid IMPRESSION: 1) Moderate LVE with inferior basal, posterior lateral hypokinesis EF 50% 2) LVH septal thickness 14 mm 3) No delayed hyperenhancement on inversion recovery sequences post gadolinium 4) Moderate LAE 5) Moderate MR 6) Normal RV Jenkins Rouge Electronically Signed   By: Jenkins Rouge M.D.   On: 07/05/2017 07:57     Scheduled Meds: . arformoterol  15 mcg Nebulization BID  . aspirin  81 mg Oral Daily  . budesonide (PULMICORT) nebulizer solution  0.5 mg Nebulization BID  . carvedilol  12.5 mg Oral BID WC  . Chlorhexidine Gluconate Cloth  6 each Topical Daily  . collagenase    Topical Daily  . furosemide  40 mg Oral Daily  . gentamicin irrigation  80 mg Irrigation To SSTC  . lisinopril  5 mg Oral Daily  . mouth rinse  15 mL Mouth Rinse BID  . mupirocin ointment  1 application Nasal BID  . predniSONE  30 mg Oral Q breakfast  . sodium chloride flush  10-40 mL Intracatheter Q12H  . sodium chloride flush  3 mL Intravenous Q12H  . sodium chloride flush  3 mL Intravenous Q12H   Continuous Infusions: . sodium chloride 20 mL/hr at 07/01/17 1900  . sodium chloride    . sodium chloride 50 mL/hr at 07/05/17 0614  . sodium chloride    .  ceFAZolin (ANCEF) IV       LOS: 9 days   Time Spent in minutes   35 minutes  Roxie Kreeger D.O. on 07/05/2017 at 1:28 PM  Between 7am to 7pm - Pager - [phone]  After 7pm go to [email or web address] - password [email or web address]  And look for the night coverage person covering for me after hours  [facility] Office  [phone]

## 2017-07-06 ENCOUNTER — Inpatient Hospital Stay (HOSPITAL_COMMUNITY): Payer: Medicare Other

## 2017-07-06 DIAGNOSIS — E876 Hypokalemia: Secondary | ICD-10-CM

## 2017-07-06 DIAGNOSIS — I5041 Acute combined systolic (congestive) and diastolic (congestive) heart failure: Secondary | ICD-10-CM

## 2017-07-06 DIAGNOSIS — I10 Essential (primary) hypertension: Secondary | ICD-10-CM

## 2017-07-06 DIAGNOSIS — N183 Chronic kidney disease, stage 3 (moderate): Secondary | ICD-10-CM

## 2017-07-06 DIAGNOSIS — N839 Noninflammatory disorder of ovary, fallopian tube and broad ligament, unspecified: Secondary | ICD-10-CM

## 2017-07-06 DIAGNOSIS — R5381 Other malaise: Secondary | ICD-10-CM

## 2017-07-06 LAB — GLUCOSE, CAPILLARY
Glucose-Capillary: 107 mg/dL — ABNORMAL HIGH (ref 65–99)
Glucose-Capillary: 137 mg/dL — ABNORMAL HIGH (ref 65–99)

## 2017-07-06 LAB — BASIC METABOLIC PANEL
Anion gap: 10 (ref 5–15)
BUN: 40 mg/dL — ABNORMAL HIGH (ref 6–20)
CO2: 22 mmol/L (ref 22–32)
Calcium: 9.2 mg/dL (ref 8.9–10.3)
Chloride: 106 mmol/L (ref 101–111)
Creatinine, Ser: 1.42 mg/dL — ABNORMAL HIGH (ref 0.44–1.00)
GFR calc Af Amer: 47 mL/min — ABNORMAL LOW (ref >60)
GFR calc non Af Amer: 41 mL/min — ABNORMAL LOW (ref >60)
Glucose, Bld: 98 mg/dL (ref 65–99)
Potassium: 4.3 mmol/L (ref 3.5–5.1)
Sodium: 138 mmol/L (ref 135–145)

## 2017-07-06 LAB — CBC
HCT: 37.4 % (ref 36.0–46.0)
Hemoglobin: 11.9 g/dL — ABNORMAL LOW (ref 12.0–15.0)
MCH: 29.1 pg (ref 26.0–34.0)
MCHC: 31.8 g/dL (ref 30.0–36.0)
MCV: 91.4 fL (ref 78.0–100.0)
Platelets: 241 10*3/uL (ref 150–400)
RBC: 4.09 MIL/uL (ref 3.87–5.11)
RDW: 14.7 % (ref 11.5–15.5)
WBC: 10.3 10*3/uL (ref 4.0–10.5)

## 2017-07-06 MED ORDER — FUROSEMIDE 40 MG PO TABS: 40.0000 mg | ORAL_TABLET | Freq: Every day | ORAL | 0 refills | Status: DC

## 2017-07-06 MED ORDER — CARVEDILOL 12.5 MG PO TABS: 12.5000 mg | ORAL_TABLET | Freq: Two times a day (BID) | ORAL | 0 refills | Status: DC

## 2017-07-06 MED ORDER — LISINOPRIL 5 MG PO TABS: 5.0000 mg | ORAL_TABLET | Freq: Every day | ORAL | 0 refills | Status: DC

## 2017-07-06 MED ORDER — COLLAGENASE 250 UNIT/GM EX OINT: 1.0000 "application " | TOPICAL_OINTMENT | CUTANEOUS | 0 refills | Status: DC | PRN

## 2017-07-06 MED ORDER — ALBUTEROL SULFATE HFA 108 (90 BASE) MCG/ACT IN AERS: 2.0000 | INHALATION_SPRAY | Freq: Four times a day (QID) | RESPIRATORY_TRACT | 0 refills | Status: DC | PRN

## 2017-07-06 MED ORDER — PREDNISONE 10 MG PO TABS: ORAL_TABLET | ORAL | 0 refills | Status: DC

## 2017-07-06 MED ORDER — HYDROCODONE-ACETAMINOPHEN 5-325 MG PO TABS: 1.0000 | ORAL_TABLET | Freq: Four times a day (QID) | ORAL | 0 refills | Status: DC | PRN

## 2017-07-06 NOTE — Discharge Instructions (Signed)
° ° °  Supplemental Discharge Instructions for  Pacemaker/Defibrillator Patients  Activity No heavy lifting or vigorous activity with your left/right arm for 6 to 8 weeks.  Do not raise your left/right arm above your head for one week.  Gradually raise your affected arm as drawn below.              07/09/17                    07/10/16                    07/11/17                   07/12/17 __  NO DRIVING for  6 months .  WOUND CARE - Keep the wound area clean and dry.  Do not get this area wet, no showers until cleared to at your wound check visit - The tape/steri-strips on your wound will fall off; do not pull them off.  No bandage is needed on the site.  DO  NOT apply any creams, oils, or ointments to the wound area. - If you notice any drainage or discharge from the wound, any swelling or bruising at the site, or you develop a fever > 101? F after you are discharged home, call the office at once.  Special Instructions - You are still able to use cellular telephones; use the ear opposite the side where you have your pacemaker/defibrillator.  Avoid carrying your cellular phone near your device. - When traveling through airports, show security personnel your identification card to avoid being screened in the metal detectors.  Ask the security personnel to use the hand wand. - Avoid arc welding equipment, MRI testing (magnetic resonance imaging), TENS units (transcutaneous nerve stimulators).  Call the office for questions about other devices. - Avoid electrical appliances that are in poor condition or are not properly grounded. - Microwave ovens are safe to be near or to operate.  Additional information for defibrillator patients should your device go off: - If your device goes off ONCE and you feel fine afterward, notify the device clinic nurses. - If your device goes off ONCE and you do not feel well afterward, call 911. - If your device goes off TWICE, call 911. - If your device goes off  THREE times in one day, call 911.  DO NOT DRIVE YOURSELF OR A FAMILY MEMBER WITH A DEFIBRILLATOR TO THE HOSPITAL--CALL 911.

## 2017-07-06 NOTE — Progress Notes (Signed)
Orders received for pt discharge.  Discharge summary printed and reviewed with pt.  Explained medication regimen, and pt had no further questions at this time.  IV removed and site remains clean, dry, intact.  Telemetry removed.  Pt in stable condition and awaiting transport. 

## 2017-07-06 NOTE — Progress Notes (Signed)
Progress Note  Patient Name: Kari [name] Date of Encounter: 07/06/2017  Primary Cardiologist: Dr. Harrington Challenger  Subjective  Some soreness R chest non pleuritic  No sob  Inpatient Medications    Scheduled Meds: . arformoterol  15 mcg Nebulization BID  . aspirin  81 mg Oral Daily  . budesonide (PULMICORT) nebulizer solution  0.5 mg Nebulization BID  . carvedilol  12.5 mg Oral BID WC  . Chlorhexidine Gluconate Cloth  6 each Topical Daily  . collagenase   Topical Daily  . furosemide  40 mg Oral Daily  . lisinopril  5 mg Oral Daily  . mouth rinse  15 mL Mouth Rinse BID  . mupirocin ointment  1 application Nasal BID  . predniSONE  30 mg Oral Q breakfast  . sodium chloride flush  10-40 mL Intracatheter Q12H  . sodium chloride flush  3 mL Intravenous Q12H   Continuous Infusions: . sodium chloride 20 mL/hr at 07/01/17 1900  . sodium chloride    .  ceFAZolin (ANCEF) IV Stopped (07/06/17 0530)   PRN Meds: sodium chloride, acetaminophen, hydrALAZINE, ipratropium-albuterol, ondansetron (ZOFRAN) IV, oxyCODONE-acetaminophen, sodium chloride flush, sodium chloride flush   Vital Signs    Vitals:   07/05/17 1800 07/05/17 2000 07/06/17 0042 07/06/17 0520  BP: 127/77 96/68 (!) 96/45 118/68  Pulse: (!) 56 68 (!) 58 61  Resp:  20 18 18   Temp:  98.3 F (36.8 C) 98.1 F (36.7 C) 97.6 F (36.4 C)  TempSrc:  Oral Oral Oral  SpO2:  99% 98% 100%  Weight:    264 lb (119.7 kg)  Height:        Intake/Output Summary (Last 24 hours) at 07/06/17 0843 Last data filed at 07/06/17 0717  Gross per 24 hour  Intake              575 ml  Output              500 ml  Net               75 ml   Filed Weights   07/04/17 0645 07/05/17 0322 07/06/17 0520  Weight: 272 lb 6.4 oz (123.6 kg) 266 lb 6.4 oz (120.8 kg) 264 lb (119.7 kg)    Telemetry    NSR  Without VT  Personally Reviewed  ECG  Sinus with LVH  No QT or repol abnormalities no prexcitation Personally Reviewed  Physical Exam   Well  developed and Morbidly obese  in no acute distress HENT normal Neck supple   Pocket without hematoma, mild swelling and  tenderness  Carotids brisk and full without bruits Clear Regular rate and rhythm, no murmurs or gallops Abd-soft with active BS without hepatomegaly No Clubbing cyanosis edema Skin-warm and dry A & Oriented  Grossly normal sensory and motor function  DEVICE FUNCTION --Normal    Labs    Chemistry  Recent Labs Lab 07/03/17 0428 07/04/17 0359 07/06/17 0501  NA 138 138 138  K 3.8 4.2 4.3  CL 100* 105 106  CO2 30 28 22   GLUCOSE 77 112* 98  BUN 38* 34* 40*  CREATININE 1.37* 1.22* 1.42*  CALCIUM 9.3 8.8* 9.2  PROT  --  6.9  --   ALBUMIN  --  2.7*  --   AST  --  15  --   ALT  --  26  --   ALKPHOS  --  47  --   BILITOT  --  0.8  --  GFRNONAA 43* 49* 41*  GFRAA 49* 57* 47*  ANIONGAP 8 5 10      Hematology  Recent Labs Lab 07/03/17 0428 07/04/17 0359 07/06/17 0501  WBC 14.2* 11.4* 10.3  RBC 4.39 4.21 4.09  HGB 12.6 11.9* 11.9*  HCT 40.4 38.4 37.4  MCV 92.0 91.2 91.4  MCH 28.7 28.3 29.1  MCHC 31.2 31.0 31.8  RDW 14.7 14.5 14.7  PLT 259 221 241    Cardiac EnzymesNo results for input(s): TROPONINI in the last 168 hours. No results for input(s): TROPIPOC in the last 168 hours.   BNPNo results for input(s): BNP, PROBNP in the last 168 hours.   DDimer No results for input(s): DDIMER in the last 168 hours.   Radiology    Dg Chest 2 View  Result Date: 07/06/2017 CLINICAL DATA:  Status post ICD placement. EXAM: CHEST  2 VIEW COMPARISON:  07/03/2017 FINDINGS: New left anterior chest wall single lead pacemaker has its leads projecting in the right ventricle. No pneumothorax. Lungs are clear. Cardiac silhouette is mildly enlarged. No mediastinal or hilar masses. Skeletal structures are unremarkable. IMPRESSION: 1. Left anterior chest wall pacemaker is well positioned. 2. No pneumothorax.  No acute cardiopulmonary disease. Electronically Signed   By:  Lajean Manes M.D.   On: 07/06/2017 08:30   Mr Cardiac Morphology W Wo Contrast  Result Date: 07/05/2017 CLINICAL DATA:  Sarcoid, VF EXAM: CARDIAC MRI TECHNIQUE: The patient was scanned on a 1.5 Tesla GE magnet. A dedicated cardiac coil was used. Functional imaging was done using Fiesta sequences. 2,3, and 4 chamber views were done to assess for RWMA's. Modified Simpson's rule using a short axis stack was used to calculate an ejection fraction on a dedicated work Conservation officer, nature. The patient received 35 cc of Multihance. After 10 minutes inversion recovery sequences were used to assess for infiltration and scar tissue. CONTRAST:  35 cc Multihance FINDINGS: There was moderate LAE. RA/RV were normal. There was moderate MR. The aortic valve was tri-leaflet and moderately thickened. The aortic root was normal 3.4 cm. There was no ASD/PFO or VSD There was no pericardial effusion. There was moderate LVE. The inferior base and posterior lateral walls were somewhat thinned and hypokinetic. The quantitative EF was 50% (EDV 199 ESV 99 SV 100) Delayed enhancement images with gadolinium showed no hyperenhancement, scar, infiltration or evidence of sarcoid IMPRESSION: 1) Moderate LVE with inferior basal, posterior lateral hypokinesis EF 50% 2) LVH septal thickness 14 mm 3) No delayed hyperenhancement on inversion recovery sequences post gadolinium 4) Moderate LAE 5) Moderate MR 6) Normal RV [place] Electronically Signed   By: Jenkins Rouge M.D.   On: 07/05/2017 07:57    Cardiac Studies   2D Echo 06/27/17 Study Conclusions  - Left ventricle: Diffuse hypokinesis worse in the inferior basal wall The cavity size was moderately dilated. Wall thickness was increased in a pattern of moderate LVH. Systolic function was mildly to moderately reduced. The estimated ejection fraction was in the range of 40% to 45%. Left ventricular diastolic function parameters were normal. - Aortic valve: There  was mild regurgitation. - Mitral valve: There was moderate regurgitation. - Left atrium: The atrium was moderately dilated. - Atrial septum: No defect or patent foramen ovale was identified.  [facility] 07/03/17 Procedures   Left Heart Cath and Coronary Angiography  Conclusion    Left dominant coronary anatomy.  Normal coronary arteries.  Mild to moderate global left ventricular hypo-kinesis with estimated EF 35-40%. LV end-diastolic pressure is  up and normal. Combined systolic and diastolic heart failure, chronic   Recommendations:   Further management per treating team including CAD including guideline directed heart failure therapy  EP evaluation for possible AICD.   Cardiac MRI 07/04/17 FINDINGS: There was moderate LAE. RA/RV were normal. There was moderate MR. The aortic valve was tri-leaflet and moderately thickened. The aortic root was normal 3.4 cm. There was no ASD/PFO or VSD There was no pericardial effusion. There was moderate LVE. The inferior base and posterior lateral walls were somewhat thinned and hypokinetic. The quantitative EF was 50% (EDV 199 ESV 99 SV 100) Delayed enhancement images with gadolinium showed no hyperenhancement, scar, infiltration or evidence of sarcoid  IMPRESSION: 1) Moderate LVE with inferior basal, posterior lateral hypokinesis EF 50%  2) LVH septal thickness 14 mm  3) No delayed hyperenhancement on inversion recovery sequences post gadolinium  4) Moderate LAE  5) Moderate MR  6) Normal RV   Patient Profile   56 y.o.femalewith past medical history of sarcoid, hypertension, hyperlipidemia, diastolic congestive heart failure, asthma admitted following cardiac arrest. Patient received 5-10 minutes of CPR following ventricular fibrillation arrest. She required one defibrillation to sinus rhythm. Echocardiogram shows ejection fraction 40-45%, moderate LVH, moderate LVE, mild aortic insufficiency, moderate mitral  regurgitation and moderate left atrial enlargement.  Assessment & Plan    Cardiac Arrest/VT NICM CHF acute Atrial fib  Paroxysmal Acute kidney injury Morbid obesity  Device function normal Instructions given  < 10 lbs for 6 wweks  Shower next Friday  Office followup arranged  Remote monitroing emphasized Continue current meds No driving x 6 months   Diuretics aere qd,  prob ok  Will need BMET checked next week\ Elevated BUN ? 2/2 prednisone  WHO IS CONTROLLING TAPER  Ok from cardiology to go home         Virl Axe, MD

## 2017-07-06 NOTE — Discharge Summary (Signed)
Physician Discharge Summary  Kari [name] VHQ:[id number] DOB: 1961-09-02 DOA: 06/26/2017  PCP: Rosita Fire, MD  Admit date: 06/26/2017 Discharge date: 07/06/2017  Time spent: 45 minutes  Recommendations for Outpatient Follow-up:  Patient will be discharged to home with home health services, physical therapy and nursing.  Patient will need to follow up with primary care provider within one week of discharge, repeat BMP.  Follow up with cardiology.  Patient should continue medications as prescribed.  Patient should follow a heart healthy diet.  No driving for 6 months. Do not lift more than 10lbs (this is for 6 weeks).   Discharge Diagnoses:  Cardiac arrest Acute hypoxic respiratory failure/acute pulmonary edema Acute systolic/diastolic heart failure Aspiration pneumonia Chronic kidney disease, stage III Sarcoidosis Essential hypertension Chronic bilateral lower extremity venous ulcers Large cystic ovarian mass Deconditioning  Discharge Condition: Stable  Diet recommendation: heart healthy  Filed Weights   07/04/17 0645 07/05/17 0322 07/06/17 0520  Weight: 123.6 kg (272 lb 6.4 oz) 120.8 kg (266 lb 6.4 oz) 119.7 kg (264 lb)    History of present illness:  on 06/26/2017 by Dr. Sheral Apley, PCCM Patient is intubated and unresponsive, therefore HPI obtained from medical chart review.  56 year old female with past medical history significant for asthma, heart failure (11/2016 EF 55-60%) , HTN, Questionable sarcoidosis (based on mediastinal lymphadenopathy no biopsy), HLD, obesity, chronic BLE ulcers, large cystic ovarian mass, headache and depression who presented to [facility] ED on 7/4 in cardiac arrest around 1455.  ED staff state that they have contacted her family. Questionably patient lives with her brother who is traveling back from [place]. Patient also has a sister who was present at [facility] and unaware of any recent complaints from patient.  Reportedly patient  had asked a stranger for a ride from the [place] store and went unresponsive in the car. Bystander and FD CPR was started. EMS arrived and given one epinephrine and one defibrillation for v-fib. Estimated 5 -10 mins of CPR were given prior to ROSC. King airway was placed and patient transferred to the ED where patient remained unresponsive not requiring any sedation. Hypotensive treated with 3.5 L NS bolus. Labs noted for glucose 112, Na 138, K 3.1, CO2 19, sCr 1.39 (previously 1.17 on 5/18), BUN 22, AST 160, ALT 108, albumin 3.2, troponin I poc 0.01, Lactic acid 6.91, WBC 8.5, Hgb 11.2, UDS neg, ABG 7.32/39.3/270 on 60% FiO2. CXR shows pulmonary vascular congestion. EKG non acute. Definitive airway placed in EAsthma exacerbation?D. Head CT negative. Patient to be transferred to [facility] for targeted temperature management, PCCM to admit.   Hospital Course:  Cardiac arrest -5-10 minutes of CPR before ROS see -Cardiology consulted and appreciated -Echocardiogram showed EF of 40-45% -Status post catheterization, results below -Cardiac MRI results below -EP consulted and appreciated, s/p AICD placement -Continue aspirin -Follow up with cardiology. Patient instructed no lifting for 6 weeks, only less than 10lbs -No driving for 6 months  Acute hypoxic respiratory failure/acute pulmonary edema -Patient did require intubation and extubation -No longer on supplemental oxygen, currently satting upper 90s in room air -Patient diuresis, -17 L -Patient thought to have a possible asthma exacerbation per PCCM notes, placed on prednisone 30mg - will discharge with short taper  Acute systolic/diastolic heart failure -Status post arrest -Monitor intake and output, daily weights -Currently appears to be euvolemic -Continue Lasix, Coreg, lisinopril  Aspiration pneumonia -Completed course of empiric antibiotics  Chronic kidney disease, stage III -After review of Epic, patient's kidney  function in  2017 was actually worse, she was stage IV at that time and has since improved -GFR currently in stage III, creatinine stable -Repeat BMP in one week  Sarcoidosis? 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 Surgery was postponed due to acute kidney injury -Patiently outpatient follow-up  Deconditioning -PT consulted and recommended home health -Will discharge patient with home health services, physical therapy and nursing  Consultants PCCM Cardiology EP  Procedures / significant events 7/4 Admitted for V. fib arrest, intubated 7/5 echocardiogram showed EF 40-45% 7/5 EEG showed diffuse slowing 7/9 extubated 7/11 left heart catheterization: Left dominant coronary anatomy, normal coronary arteries, mild to moderate global left ventricular hypokinesis, EF 35-40%. Chronic combined systolic and diastolic heart failure. EP evaluation for ICD placement 7/12 Cardiac MRI: Moderate LVE with inferior basal, posterior lateral hypokinesis EF 50%. Moderate LAE, moderate MR, normal RV 7/13 ICD placement  Discharge Exam: Vitals:   07/06/17 0042 07/06/17 0520  BP: (!) 96/45 118/68  Pulse: (!) 58 61  Resp: 18 18  Temp: 98.1 F (36.7 C) 97.6 F (36.4 C)   Patient feeling better    General: Well developed, well nourished, NAD, appears stated age  HEENT: NCAT, mucous membranes moist.  Cardiovascular: S1 S2 auscultated, no rubs, murmurs or gallops. Regular rate and rhythm.  Respiratory: Clear to auscultation bilaterally with equal chest rise  Abdomen: Soft, nontender, nondistended, + bowel sounds  Extremities: warm dry without cyanosis clubbing or edema, dressing on lower ext B/L   Neuro: AAOx3, nonfocal  Psych: appropriate mood and affect  Discharge Instructions Discharge Instructions    Discharge instructions     Complete by:  As directed    Patient will be discharged to home with home health services, physical therapy and nursing.  Patient will need to follow up with primary care provider within one week of discharge, repeat BMP.  Follow up with cardiology.  Patient should continue medications as prescribed.  Patient should follow a heart healthy diet.  No driving for 6 months. Do not lift more than 10lbs (this is for 6 weeks).     Current Discharge Medication List    START taking these medications   Details  carvedilol (COREG) 12.5 MG tablet Take 1 tablet (12.5 mg total) by mouth 2 (two) times daily with a meal. Qty: 60 tablet, Refills: 0    lisinopril (PRINIVIL,ZESTRIL) 5 MG tablet Take 1 tablet (5 mg total) by mouth daily. Qty: 30 tablet, Refills: 0    predniSONE (DELTASONE) 10 MG tablet Take 2 pills for 2 days, then taper 1 pill for 2 days. Qty: 6 tablet, Refills: 0      CONTINUE these medications which have CHANGED   Details  albuterol (PROVENTIL HFA;VENTOLIN HFA) 108 (90 Base) MCG/ACT inhaler Inhale 2 puffs into the lungs every 6 (six) hours as needed for shortness of breath. Qty: 18 g, Refills: 0    collagenase (SANTYL) ointment Apply 1 application topically as needed (for wound care). Apply to wound BLE per Tx Qty: 15 g, Refills: 0    furosemide (LASIX) 40 MG tablet Take 1 tablet (40 mg total) by mouth daily. Qty: 30 tablet, Refills: 0    HYDROcodone-acetaminophen (NORCO/VICODIN) 5-325 MG tablet Take 1-2 tablets by mouth every 6 (six) hours as needed for moderate pain. Qty: 20 tablet, Refills: 0      CONTINUE these medications  which have NOT CHANGED   Details  ondansetron (ZOFRAN) 4 MG tablet Take 1 tablet (4 mg total) by mouth every 6 (six) hours as needed for nausea. Qty: 20 tablet, Refills: 0    potassium chloride SA (K-DUR,KLOR-CON) 20 MEQ tablet Take 60 mEq by mouth daily.    acetaminophen (TYLENOL) 325 MG tablet Take 2 tablets (650 mg total) by mouth every 6 (six)  hours as needed for mild pain (or Fever >/= 101).    Amino Acids-Protein Hydrolys (FEEDING SUPPLEMENT, PRO-STAT SUGAR FREE 64,) LIQD Take 60 mLs by mouth 2 (two) times daily between meals. Qty: 900 mL, Refills: 0    aspirin EC 81 MG tablet Take 81 mg by mouth daily.    loratadine (CLARITIN) 10 MG tablet Take 10 mg by mouth daily.    Multiple Vitamin (MULTIVITAMIN WITH MINERALS) TABS tablet Take 1 tablet by mouth daily.      STOP taking these medications     naproxen (NAPROSYN) 500 MG tablet        No Known Allergies Follow-up Information    [facility] Follow up on 07/22/2017.   Specialty:  Cardiology Why:  3:00PM, wound check Contact information: [address] [place]       Constance Haw, MD Follow up on 10/14/2017.   Specialty:  Cardiology Why:  10:45AM Contact information: [address] [place] [address] [phone]        Rosita Fire, MD. Schedule an appointment as soon as possible for a visit in 1 week(s).   Specialty:  Internal Medicine Why:  Hospital follow up Contact information: [place] [address] [phone]            The results of significant diagnostics from this hospitalization (including imaging, microbiology, ancillary and laboratory) are listed below for reference.    Significant Diagnostic Studies: Dg Chest 2 View  Result Date: 07/06/2017 CLINICAL DATA:  Status post ICD placement. EXAM: CHEST  2 VIEW COMPARISON:  07/03/2017 FINDINGS: New left anterior chest wall single lead pacemaker has its leads projecting in the right ventricle. No pneumothorax. Lungs are clear. Cardiac silhouette is mildly enlarged. No mediastinal or hilar masses. Skeletal structures are unremarkable. IMPRESSION: 1. Left anterior chest wall pacemaker is well positioned. 2. No pneumothorax.  No acute cardiopulmonary disease. Electronically  Signed   By: Lajean Manes M.D.   On: 07/06/2017 08:30   Ct Head Wo Contrast  Result Date: 06/26/2017 CLINICAL DATA:  Cardiac arrest. EXAM: CT HEAD WITHOUT CONTRAST TECHNIQUE: Contiguous axial images were obtained from the base of the skull through the vertex without intravenous contrast. COMPARISON:  08/03/2006. FINDINGS: Brain: There is no evidence for acute hemorrhage, hydrocephalus, mass lesion, or abnormal extra-axial fluid collection. No definite CT evidence for acute infarction. Vascular: No hyperdense vessel or unexpected calcification. Skull: No evidence for fracture. No worrisome lytic or sclerotic lesion. Sinuses/Orbits: The visualized paranasal sinuses and mastoid air cells are clear. Visualized portions of the globes and intraorbital fat are unremarkable. Other: None. IMPRESSION: Stable.  No acute intracranial abnormality. Electronically Signed   By: Misty Stanley M.D.   On: 06/26/2017 16:36   Dg Chest Port 1 View  Result Date: 07/03/2017 CLINICAL DATA:  Shortness of breath.  Hot and sweating. EXAM: PORTABLE CHEST 1 VIEW COMPARISON:  At 17:18. FINDINGS: Stable central catheter. Cardiac enlargement. Worsening aeration, developing BILATERAL pulmonary opacities most consistent with pulmonary edema. Low lung  volumes. No osseous findings. IMPRESSION: Worsening aeration.  Developing pulmonary edema. Electronically Signed   By: Staci Righter M.D.   On: 07/03/2017 08:47   Dg Chest Port 1 View  Result Date: 07/02/2017 CLINICAL DATA:  Respiratory failure/hypoxia EXAM: PORTABLE CHEST 1 VIEW COMPARISON:  July 01, 2017 FINDINGS: Central catheter tip is in the superior vena cava. Endotracheal tube and nasogastric tube have been removed. No evident pneumothorax. There has been interval clearing of patchy consolidation from the left base. Mild left base atelectasis remains. Mild right base atelectasis medially is also stable. No new opacity. Heart is mildly enlarged with pulmonary vascularity within normal  limits. No adenopathy. No bone lesions. IMPRESSION: Central catheter tip in superior vena cava. No pneumothorax. Stable cardiomegaly. Bibasilar atelectasis with interval significant clearing on the left. No new opacity on either side. Electronically Signed   By: Lowella Grip III M.D.   On: 07/02/2017 08:06   Dg Chest Port 1 View  Result Date: 07/01/2017 CLINICAL DATA:  Acute respiratory failure with hypoxia.  Intubated. EXAM: PORTABLE CHEST 1 VIEW COMPARISON:  Chest radiograph from one day prior. FINDINGS: Endotracheal tube tip is 3.3 cm above the carina. Enteric tube enters stomach with the tip not seen on this image. Left internal jugular central venous catheter terminates in the middle third of the superior vena cava. Stable cardiomediastinal silhouette with mild cardiomegaly. No pneumothorax. No pleural effusion. Patchy bibasilar consolidation, left greater than right, stable. No overt pulmonary edema. IMPRESSION: 1. Well-positioned support structures. 2. Stable mild cardiomegaly without overt pulmonary edema. 3. Stable patchy bibasilar lung consolidation, left greater than right. Electronically Signed   By: Ilona Sorrel M.D.   On: 07/01/2017 07:41   Dg Chest Port 1 View  Result Date: 06/30/2017 CLINICAL DATA:  Acute respiratory failure with hypoxia. EXAM: PORTABLE CHEST 1 VIEW COMPARISON:  06/29/2017 and 06/28/2017. FINDINGS: 0515 hours. Patient rotated to the left. Endotracheal tube tip is about 2.6 cm above the base of the carina. Left IJ central line tip is stable, projecting over the location of the mid SVC. Pulmonary vascular congestion again noted with retrocardiac left base collapse/ consolidation. The NG tube passes into the stomach although the distal tip position is not included on the film. Telemetry leads overlie the chest. IMPRESSION: 1. No substantial change in exam. 2. Low volume film with cardiomegaly and vascular congestion. 3. Similar appearance retrocardiac left base  collapse/consolidation. Electronically Signed   By: Misty Stanley M.D.   On: 06/30/2017 07:19   Dg Chest Port 1 View  Result Date: 06/29/2017 CLINICAL DATA:  Patient history of ET tube. EXAM: PORTABLE CHEST 1 VIEW COMPARISON:  Chest radiograph 06/28/2017. FINDINGS: ET tube terminates in the mid trachea. Enteric tube courses inferior to the diaphragm. Left IJ approach central venous catheter tip projects over the superior vena cava. Monitoring leads overlie the patient. Pacer pads overlie the patient. Stable cardiomegaly. Low lung volumes. Perihilar interstitial opacities, stable to mildly improved. Basilar heterogeneous opacities. No pleural effusion or pneumothorax. IMPRESSION: 1. Stable support apparatus. 2. Low lung volumes with mild improved interstitial opacities, potentially representing atelectasis and basilar atelectasis. 3. Possible small pleural effusions. Electronically Signed   By: Lovey Newcomer M.D.   On: 06/29/2017 07:22   Dg Chest Port 1 View  Result Date: 06/28/2017 CLINICAL DATA:  Intubation . EXAM: PORTABLE CHEST 1 VIEW COMPARISON:  06/27/2017 .  06/26/2017 . FINDINGS: Endotracheal tube, left IJ line, NG tube in stable position. Cardiomegaly with bilateral pulmonary infiltrates and or edema.  No change from prior exam. Low lung volumes. Small bilateral pleural effusions cannot be excluded. No pneumothorax. IMPRESSION: 1. Lines and tubes in stable position. 2. Cardiomegaly with bilateral pulmonary infiltrates/edema, no interim change from prior exam. Small bilateral pleural effusions cannot be excluded . Electronically Signed   By: Marcello Moores  Register   On: 06/28/2017 07:33   Dg Chest Port 1 View  Result Date: 06/27/2017 CLINICAL DATA:  Status post cardiac arrest. History of acute on chronic CHF, asthma, hypertension, sarcoidosis, nonsmoker. EXAM: PORTABLE CHEST 1 VIEW COMPARISON:  Portable chest x-ray of June 26, 2017 FINDINGS: The lungs are mildly hypoinflated. Confluent alveolar opacities  persist inferiorly in the right upper lobe and throughout much of the left lung. The hemidiaphragms are visible. There is no significant pleural effusion. No pneumothorax is observed. The cardiac silhouette is enlarged. The pulmonary vascularity is prominent centrally. The interstitial markings are less conspicuous today. The endotracheal tube tip lies 3.6 cm above the carina. The esophagogastric tube tip in proximal port project below the inferior margin of the image. The left internal jugular venous catheter tip projects over the cavoatrial junction. External pacemaker defibrillator pads are present. IMPRESSION: Further mild improvement in CHF and pulmonary interstitial and alveolar edema. The support tubes are in reasonable position. Electronically Signed   By: David  Martinique M.D.   On: 06/27/2017 07:17   Dg Chest Port 1 View  Result Date: 06/26/2017 CLINICAL DATA:  Central line placement EXAM: PORTABLE CHEST 1 VIEW COMPARISON:  Chest radiograph 06/26/2017 at 8:39 p.m. FINDINGS: Left internal jugular vein approach central venous catheter tip is at the cavoatrial junction. Endotracheal tube tip is at the level of the clavicular heads. Enteric tube courses beyond the field of view. Cardiomegaly is unchanged. Pulmonary edema is slightly improved but remains moderate. No sizable pleural effusion. IMPRESSION: 1. Left IJ CVC tip at the cavoatrial junction. 2. Cardiomegaly and slightly improved, moderate pulmonary edema. Electronically Signed   By: Ulyses Jarred M.D.   On: 06/26/2017 22:32   Dg Chest Port 1 View  Result Date: 06/26/2017 CLINICAL DATA:  Acute respiratory failure with hypoxia. EXAM: PORTABLE CHEST 1 VIEW COMPARISON:  Radiograph earlier this day at 1512 hour FINDINGS: Endotracheal tube 2.9 cm from the carina. Enteric tube in place. Again seen cardiomegaly. Worsening vascular congestion with development perihilar edema. Possible pleural effusions with blunting of costophrenic angle. No pneumothorax.  IMPRESSION: 1. Worsening vascular congestion with development of perihilar pulmonary edema. 2. Stable cardiomegaly. Electronically Signed   By: Jeb Levering M.D.   On: 06/26/2017 21:26   Dg Chest Portable 1 View  Result Date: 06/26/2017 CLINICAL DATA:  Intubation. EXAM: PORTABLE CHEST 1 VIEW COMPARISON:  04/01/2017 FINDINGS: New endotracheal tube with tip at the lower margin of the clavicular heads. An orogastric tube reaches the stomach. Cardiomegaly that is chronic and similar to prior when allowing for differences in technique. Low lung volumes with perihilar atelectasis and vascular congestion. No edema, effusion, or visible pneumothorax. IMPRESSION: 1. New endotracheal and orogastric tubes in good position. 2. Low volume chest with perihilar atelectasis and vascular congestion. 3. Cardiomegaly. Electronically Signed   By: Monte Fantasia M.D.   On: 06/26/2017 15:25   Mr Cardiac Morphology W Wo Contrast  Result Date: 07/05/2017 CLINICAL DATA:  Sarcoid, VF EXAM: CARDIAC MRI TECHNIQUE: The patient was scanned on a 1.5 Tesla GE magnet. A dedicated cardiac coil was used. Functional imaging was done using Fiesta sequences. 2,3, and 4 chamber views were done to assess for RWMA's.  Modified Simpson's rule using a short axis stack was used to calculate an ejection fraction on a dedicated work Conservation officer, nature. The patient received 35 cc of Multihance. After 10 minutes inversion recovery sequences were used to assess for infiltration and scar tissue. CONTRAST:  35 cc Multihance FINDINGS: There was moderate LAE. RA/RV were normal. There was moderate MR. The aortic valve was tri-leaflet and moderately thickened. The aortic root was normal 3.4 cm. There was no ASD/PFO or VSD There was no pericardial effusion. There was moderate LVE. The inferior base and posterior lateral walls were somewhat thinned and hypokinetic. The quantitative EF was 50% (EDV 199 ESV 99 SV 100) Delayed enhancement images with  gadolinium showed no hyperenhancement, scar, infiltration or evidence of sarcoid IMPRESSION: 1) Moderate LVE with inferior basal, posterior lateral hypokinesis EF 50% 2) LVH septal thickness 14 mm 3) No delayed hyperenhancement on inversion recovery sequences post gadolinium 4) Moderate LAE 5) Moderate MR 6) Normal RV [place] Rouge Electronically Signed   By: Jenkins Rouge M.D.   On: 07/05/2017 07:57    Microbiology: Recent Results (from the past 240 hour(s))  MRSA PCR Screening     Status: None   Collection Time: 06/26/17  8:30 PM  Result Value Ref Range Status   MRSA by PCR NEGATIVE NEGATIVE Final    Comment:        The GeneXpert MRSA Assay (FDA approved for NASAL specimens only), is one component of a comprehensive MRSA colonization surveillance program. It is not intended to diagnose MRSA infection nor to guide or monitor treatment for MRSA infections.   Culture, blood (routine x 2)     Status: None   Collection Time: 06/26/17  8:47 PM  Result Value Ref Range Status   Specimen Description BLOOD RIGHT ANTECUBITAL  Final   Special Requests   Final    BOTTLES DRAWN AEROBIC ONLY Blood Culture adequate volume   Culture NO GROWTH 5 DAYS  Final   Report Status 07/01/2017 FINAL  Final  Culture, blood (routine x 2)     Status: None   Collection Time: 06/26/17  8:47 PM  Result Value Ref Range Status   Specimen Description BLOOD RIGHT HAND  Final   Special Requests   Final    BOTTLES DRAWN AEROBIC ONLY Blood Culture adequate volume   Culture NO GROWTH 5 DAYS  Final   Report Status 07/01/2017 FINAL  Final  Culture, respiratory (NON-Expectorated)     Status: None   Collection Time: 06/27/17 11:20 AM  Result Value Ref Range Status   Specimen Description TRACHEAL ASPIRATE  Final   Special Requests NONE  Final   Gram Stain   Final    ABUNDANT WBC PRESENT,BOTH PMN AND MONONUCLEAR RARE GRAM NEGATIVE RODS RARE GRAM NEGATIVE COCCI IN PAIRS    Culture Consistent with normal respiratory  flora.  Final   Report Status 06/29/2017 FINAL  Final  Surgical pcr screen     Status: Abnormal   Collection Time: 07/05/17 12:55 AM  Result Value Ref Range Status   MRSA, PCR NEGATIVE NEGATIVE Final   Staphylococcus aureus POSITIVE (A) NEGATIVE Final    Comment:        The Xpert SA Assay (FDA approved for NASAL specimens in patients over 47 years of age), is one component of a comprehensive surveillance program.  Test performance has been validated by [facility] for patients greater than or equal to 52 year old. It is not intended to diagnose infection nor to  guide or monitor treatment.      Labs: Basic Metabolic Panel:  Recent Labs Lab 06/29/17 1345  06/30/17 0430 06/30/17 1656 07/01/17 0435 07/01/17 1659  07/01/17 2056 07/02/17 0358 07/03/17 0428 07/04/17 0359 07/06/17 0501  NA  --   < >  --   --  135  --   < > 143 137 138 138 138  K  --   < >  --   --  3.7  --   < > 3.5 3.8 3.8 4.2 4.3  CL  --   < >  --   --  102  --   < > 102 101 100* 105 106  CO2  --   < >  --   --  26  --   --   --  29 30 28 22   GLUCOSE  --   < >  --   --  128*  --   < > 80 78 77 112* 98  BUN  --   < >  --   --  44*  --   < > 39* 37* 38* 34* 40*  CREATININE  --   < >  --   --  1.45*  --   < > 1.30* 1.32* 1.37* 1.22* 1.42*  CALCIUM  --   < >  --   --  8.8*  --   --   --  8.8* 9.3 8.8* 9.2  MG 2.2  --  2.0 2.2 2.2 2.4  --   --   --   --   --   --   PHOS 3.8  --  3.2 3.9 2.8 2.7  --   --   --   --   --   --   < > = values in this interval not displayed. Liver Function Tests:  Recent Labs Lab 07/04/17 0359  AST 15  ALT 26  ALKPHOS 47  BILITOT 0.8  PROT 6.9  ALBUMIN 2.7*   No results for input(s): LIPASE, AMYLASE in the last 168 hours. No results for input(s): AMMONIA in the last 168 hours. CBC:  Recent Labs Lab 07/01/17 0435  07/01/17 2056 07/02/17 0358 07/03/17 0428 07/04/17 0359 07/06/17 0501  WBC 12.0*  --   --  15.9* 14.2* 11.4* 10.3  NEUTROABS  --   --   --  10.3*  --    --   --   HGB 11.4*  < > 13.6 12.2 12.6 11.9* 11.9*  HCT 36.7  < > 40.0 39.5 40.4 38.4 37.4  MCV 90.4  --   --  92.9 92.0 91.2 91.4  PLT 216  --   --  260 259 221 241  < > = values in this interval not displayed. Cardiac Enzymes: No results for input(s): CKTOTAL, CKMB, CKMBINDEX, TROPONINI in the last 168 hours. BNP: BNP (last 3 results)  Recent Labs  12/18/16 0034  BNP 56.0    ProBNP (last 3 results) No results for input(s): PROBNP in the last 8760 hours.  CBG:  Recent Labs Lab 07/05/17 0433 07/05/17 0732 07/05/17 1226 07/05/17 1509 07/06/17 0747  GLUCAP 101* 93 110* 119* 107*       Signed:  Cristal Ford  [facility] 07/06/2017, 11:37 AM'

## 2017-07-06 NOTE — Care Management Note (Signed)
Case Management Note  Patient Details  Name: Kari [name] MRN: [id number] Date of Birth: [birth year]  Subjective/Objective: Spoke with pt to confirm resumption of HHPT/OT and RN with [facility]. Informed Jermain pt will be discharging today.notified Reggie of needed DME. Prior to discharge. (4 wheeled RW with seat and 3n1)                   Action/Plan:CM will sign off for now but will be available should additional discharge needs arise or disposition change.    Expected Discharge Date:  07/06/17               Expected Discharge Plan:  Home/Self Care  In-House Referral:     Discharge planning Services  CM Consult  Post Acute Care Choice:  Home Health, Resumption of Svcs/PTA Provider Choice offered to:     DME Arranged:  3-N-1, Walker rolling with seat DME Agency:     HH Arranged:  RN, PT, OT HH Agency:  [facility]  Status of Service:  Completed, signed off  If discussed at [place] of Stay Meetings, dates discussed:    Additional Comments:  Delrae Sawyers, RN 07/06/2017, 12:51 PM

## 2017-07-07 DIAGNOSIS — I872 Venous insufficiency (chronic) (peripheral): Secondary | ICD-10-CM | POA: Diagnosis not present

## 2017-07-07 DIAGNOSIS — E876 Hypokalemia: Secondary | ICD-10-CM | POA: Diagnosis not present

## 2017-07-07 DIAGNOSIS — D869 Sarcoidosis, unspecified: Secondary | ICD-10-CM | POA: Diagnosis not present

## 2017-07-07 DIAGNOSIS — L97222 Non-pressure chronic ulcer of left calf with fat layer exposed: Secondary | ICD-10-CM | POA: Diagnosis not present

## 2017-07-07 DIAGNOSIS — J45909 Unspecified asthma, uncomplicated: Secondary | ICD-10-CM | POA: Diagnosis not present

## 2017-07-07 DIAGNOSIS — L97822 Non-pressure chronic ulcer of other part of left lower leg with fat layer exposed: Secondary | ICD-10-CM | POA: Diagnosis not present

## 2017-07-08 ENCOUNTER — Encounter (HOSPITAL_COMMUNITY): Payer: Self-pay | Admitting: Cardiology

## 2017-07-09 DIAGNOSIS — L97222 Non-pressure chronic ulcer of left calf with fat layer exposed: Secondary | ICD-10-CM | POA: Diagnosis not present

## 2017-07-09 DIAGNOSIS — I872 Venous insufficiency (chronic) (peripheral): Secondary | ICD-10-CM | POA: Diagnosis not present

## 2017-07-09 DIAGNOSIS — L97822 Non-pressure chronic ulcer of other part of left lower leg with fat layer exposed: Secondary | ICD-10-CM | POA: Diagnosis not present

## 2017-07-09 DIAGNOSIS — D869 Sarcoidosis, unspecified: Secondary | ICD-10-CM | POA: Diagnosis not present

## 2017-07-09 DIAGNOSIS — J45909 Unspecified asthma, uncomplicated: Secondary | ICD-10-CM | POA: Diagnosis not present

## 2017-07-09 DIAGNOSIS — E876 Hypokalemia: Secondary | ICD-10-CM | POA: Diagnosis not present

## 2017-07-10 DIAGNOSIS — D869 Sarcoidosis, unspecified: Secondary | ICD-10-CM | POA: Diagnosis not present

## 2017-07-10 DIAGNOSIS — L97822 Non-pressure chronic ulcer of other part of left lower leg with fat layer exposed: Secondary | ICD-10-CM | POA: Diagnosis not present

## 2017-07-10 DIAGNOSIS — E876 Hypokalemia: Secondary | ICD-10-CM | POA: Diagnosis not present

## 2017-07-10 DIAGNOSIS — I872 Venous insufficiency (chronic) (peripheral): Secondary | ICD-10-CM | POA: Diagnosis not present

## 2017-07-10 DIAGNOSIS — J45909 Unspecified asthma, uncomplicated: Secondary | ICD-10-CM | POA: Diagnosis not present

## 2017-07-10 DIAGNOSIS — L97222 Non-pressure chronic ulcer of left calf with fat layer exposed: Secondary | ICD-10-CM | POA: Diagnosis not present

## 2017-07-11 ENCOUNTER — Encounter: Payer: Self-pay | Admitting: *Deleted

## 2017-07-11 ENCOUNTER — Other Ambulatory Visit: Payer: Self-pay | Admitting: *Deleted

## 2017-07-11 DIAGNOSIS — I872 Venous insufficiency (chronic) (peripheral): Secondary | ICD-10-CM | POA: Diagnosis not present

## 2017-07-11 DIAGNOSIS — L97222 Non-pressure chronic ulcer of left calf with fat layer exposed: Secondary | ICD-10-CM | POA: Diagnosis not present

## 2017-07-11 DIAGNOSIS — J45909 Unspecified asthma, uncomplicated: Secondary | ICD-10-CM | POA: Diagnosis not present

## 2017-07-11 DIAGNOSIS — E876 Hypokalemia: Secondary | ICD-10-CM | POA: Diagnosis not present

## 2017-07-11 DIAGNOSIS — D869 Sarcoidosis, unspecified: Secondary | ICD-10-CM | POA: Diagnosis not present

## 2017-07-11 DIAGNOSIS — L97822 Non-pressure chronic ulcer of other part of left lower leg with fat layer exposed: Secondary | ICD-10-CM | POA: Diagnosis not present

## 2017-07-11 NOTE — Patient Outreach (Signed)
[facility]) Care Management  07/11/2017  CADEE AGRO Mar 22, 1961 [id number]  EMMI-Heart failure- Red alert for day #2 on 07/10/2017 Reason-"weighed themselves today?"-no  Telephone call to patient who advised that she had not weighed because she is in  the process of getting scales. States her MD office is assisting her with scales & she will follow up with them today.  Advised patient of importance of weighing daily & of weights that are reportable to her MD. Patient voices understanding.    Patient voices that she has all of her medications. Voices that she manages medications herself & is taking as prescribed by her MD. Medication review completed.   States follow up appointment with primary care provider is scheduled for 7/23 and follow up with cardiologist scheduled for 7/30. States her sister will provide transportation.   Voices that he is receiving home health services of [place] (RN, PT, OT).  Patient states no health concerns currently. EMMI alert addressed.  Plan: Send EMMI-Heart Literature as means of reenforcement of teaching done on abnormal symptoms & action plan. Send case to care management assistant to close out.  Sherrin Daisy, RN BSN [place] Management Coordinator [facility] Care Management  [phone]

## 2017-07-12 ENCOUNTER — Other Ambulatory Visit: Payer: Self-pay | Admitting: *Deleted

## 2017-07-12 NOTE — Patient Outreach (Signed)
[name]) Care Management  07/12/2017  Kari [name] 1961/01/04 [id number]  EMMI-Heart Failure referral: Day #3, 07/11/2017 Red Alert reason: weighed today-No  Telephone call to patient who advises that she does not have scales yet but should be getting them next week. States she does not currently have any swelling or     trouble breathing. States she is taking medications consistently as prescribed by MD.  States she is aware of heart failure symptoms that need to be reported to her MD.  Patient states no health concerns today.  EMMI call completed.  Plan: Send care management assistant request for closure.   Kari Daisy, RN BSN [place] Management Coordinator [facility] Care Management  [phone]

## 2017-07-15 DIAGNOSIS — L97822 Non-pressure chronic ulcer of other part of left lower leg with fat layer exposed: Secondary | ICD-10-CM | POA: Diagnosis not present

## 2017-07-15 DIAGNOSIS — J45909 Unspecified asthma, uncomplicated: Secondary | ICD-10-CM | POA: Diagnosis not present

## 2017-07-15 DIAGNOSIS — D869 Sarcoidosis, unspecified: Secondary | ICD-10-CM | POA: Diagnosis not present

## 2017-07-15 DIAGNOSIS — L97904 Non-pressure chronic ulcer of unspecified part of unspecified lower leg with necrosis of bone: Secondary | ICD-10-CM | POA: Diagnosis not present

## 2017-07-15 DIAGNOSIS — L97222 Non-pressure chronic ulcer of left calf with fat layer exposed: Secondary | ICD-10-CM | POA: Diagnosis not present

## 2017-07-15 DIAGNOSIS — I10 Essential (primary) hypertension: Secondary | ICD-10-CM | POA: Diagnosis not present

## 2017-07-15 DIAGNOSIS — E876 Hypokalemia: Secondary | ICD-10-CM | POA: Diagnosis not present

## 2017-07-15 DIAGNOSIS — I5041 Acute combined systolic (congestive) and diastolic (congestive) heart failure: Secondary | ICD-10-CM | POA: Diagnosis not present

## 2017-07-15 DIAGNOSIS — I872 Venous insufficiency (chronic) (peripheral): Secondary | ICD-10-CM | POA: Diagnosis not present

## 2017-07-16 DIAGNOSIS — J45909 Unspecified asthma, uncomplicated: Secondary | ICD-10-CM | POA: Diagnosis not present

## 2017-07-16 DIAGNOSIS — D869 Sarcoidosis, unspecified: Secondary | ICD-10-CM | POA: Diagnosis not present

## 2017-07-16 DIAGNOSIS — L97222 Non-pressure chronic ulcer of left calf with fat layer exposed: Secondary | ICD-10-CM | POA: Diagnosis not present

## 2017-07-16 DIAGNOSIS — L97822 Non-pressure chronic ulcer of other part of left lower leg with fat layer exposed: Secondary | ICD-10-CM | POA: Diagnosis not present

## 2017-07-16 DIAGNOSIS — E876 Hypokalemia: Secondary | ICD-10-CM | POA: Diagnosis not present

## 2017-07-16 DIAGNOSIS — I872 Venous insufficiency (chronic) (peripheral): Secondary | ICD-10-CM | POA: Diagnosis not present

## 2017-07-17 DIAGNOSIS — D869 Sarcoidosis, unspecified: Secondary | ICD-10-CM | POA: Diagnosis not present

## 2017-07-17 DIAGNOSIS — L97222 Non-pressure chronic ulcer of left calf with fat layer exposed: Secondary | ICD-10-CM | POA: Diagnosis not present

## 2017-07-17 DIAGNOSIS — J45909 Unspecified asthma, uncomplicated: Secondary | ICD-10-CM | POA: Diagnosis not present

## 2017-07-17 DIAGNOSIS — I872 Venous insufficiency (chronic) (peripheral): Secondary | ICD-10-CM | POA: Diagnosis not present

## 2017-07-17 DIAGNOSIS — E876 Hypokalemia: Secondary | ICD-10-CM | POA: Diagnosis not present

## 2017-07-17 DIAGNOSIS — L97822 Non-pressure chronic ulcer of other part of left lower leg with fat layer exposed: Secondary | ICD-10-CM | POA: Diagnosis not present

## 2017-07-18 DIAGNOSIS — E876 Hypokalemia: Secondary | ICD-10-CM | POA: Diagnosis not present

## 2017-07-18 DIAGNOSIS — L97822 Non-pressure chronic ulcer of other part of left lower leg with fat layer exposed: Secondary | ICD-10-CM | POA: Diagnosis not present

## 2017-07-18 DIAGNOSIS — I872 Venous insufficiency (chronic) (peripheral): Secondary | ICD-10-CM | POA: Diagnosis not present

## 2017-07-18 DIAGNOSIS — J45909 Unspecified asthma, uncomplicated: Secondary | ICD-10-CM | POA: Diagnosis not present

## 2017-07-18 DIAGNOSIS — L97222 Non-pressure chronic ulcer of left calf with fat layer exposed: Secondary | ICD-10-CM | POA: Diagnosis not present

## 2017-07-18 DIAGNOSIS — D869 Sarcoidosis, unspecified: Secondary | ICD-10-CM | POA: Diagnosis not present

## 2017-07-19 ENCOUNTER — Encounter (HOSPITAL_COMMUNITY): Payer: Self-pay

## 2017-07-19 ENCOUNTER — Inpatient Hospital Stay (HOSPITAL_COMMUNITY)
Admission: EM | Admit: 2017-07-19 | Discharge: 2017-07-24 | DRG: 683 | Disposition: A | Discharge status: Home Health | Payer: Medicare Other | Attending: Internal Medicine | Admitting: Internal Medicine

## 2017-07-19 ENCOUNTER — Emergency Department (HOSPITAL_COMMUNITY): Payer: Medicare Other

## 2017-07-19 DIAGNOSIS — R7989 Other specified abnormal findings of blood chemistry: Secondary | ICD-10-CM

## 2017-07-19 DIAGNOSIS — R531 Weakness: Secondary | ICD-10-CM

## 2017-07-19 DIAGNOSIS — N390 Urinary tract infection, site not specified: Secondary | ICD-10-CM | POA: Diagnosis not present

## 2017-07-19 DIAGNOSIS — Z823 Family history of stroke: Secondary | ICD-10-CM

## 2017-07-19 DIAGNOSIS — I5042 Chronic combined systolic (congestive) and diastolic (congestive) heart failure: Secondary | ICD-10-CM | POA: Diagnosis present

## 2017-07-19 DIAGNOSIS — Z6841 Body Mass Index (BMI) 40.0 and over, adult: Secondary | ICD-10-CM

## 2017-07-19 DIAGNOSIS — J45909 Unspecified asthma, uncomplicated: Secondary | ICD-10-CM | POA: Diagnosis present

## 2017-07-19 DIAGNOSIS — N179 Acute kidney failure, unspecified: Secondary | ICD-10-CM | POA: Diagnosis not present

## 2017-07-19 DIAGNOSIS — E860 Dehydration: Secondary | ICD-10-CM | POA: Diagnosis present

## 2017-07-19 DIAGNOSIS — Z23 Encounter for immunization: Secondary | ICD-10-CM

## 2017-07-19 DIAGNOSIS — Y92009 Unspecified place in unspecified non-institutional (private) residence as the place of occurrence of the external cause: Secondary | ICD-10-CM

## 2017-07-19 DIAGNOSIS — Z8249 Family history of ischemic heart disease and other diseases of the circulatory system: Secondary | ICD-10-CM

## 2017-07-19 DIAGNOSIS — L97229 Non-pressure chronic ulcer of left calf with unspecified severity: Secondary | ICD-10-CM | POA: Diagnosis present

## 2017-07-19 DIAGNOSIS — I340 Nonrheumatic mitral (valve) insufficiency: Secondary | ICD-10-CM | POA: Diagnosis present

## 2017-07-19 DIAGNOSIS — R140 Abdominal distension (gaseous): Secondary | ICD-10-CM | POA: Diagnosis not present

## 2017-07-19 DIAGNOSIS — I110 Hypertensive heart disease with heart failure: Secondary | ICD-10-CM | POA: Diagnosis present

## 2017-07-19 DIAGNOSIS — I83022 Varicose veins of left lower extremity with ulcer of calf: Secondary | ICD-10-CM | POA: Diagnosis present

## 2017-07-19 DIAGNOSIS — Z95810 Presence of automatic (implantable) cardiac defibrillator: Secondary | ICD-10-CM

## 2017-07-19 DIAGNOSIS — T502X5A Adverse effect of carbonic-anhydrase inhibitors, benzothiadiazides and other diuretics, initial encounter: Secondary | ICD-10-CM | POA: Diagnosis present

## 2017-07-19 DIAGNOSIS — E876 Hypokalemia: Secondary | ICD-10-CM | POA: Diagnosis not present

## 2017-07-19 DIAGNOSIS — Z8674 Personal history of sudden cardiac arrest: Secondary | ICD-10-CM

## 2017-07-19 DIAGNOSIS — D869 Sarcoidosis, unspecified: Secondary | ICD-10-CM | POA: Diagnosis present

## 2017-07-19 LAB — CBC WITH DIFFERENTIAL/PLATELET
Basophils Absolute: 0 10*3/uL (ref 0.0–0.1)
Basophils Relative: 0 %
Eosinophils Absolute: 0 10*3/uL (ref 0.0–0.7)
Eosinophils Relative: 0 %
HCT: 36 % (ref 36.0–46.0)
Hemoglobin: 11.8 g/dL — ABNORMAL LOW (ref 12.0–15.0)
Lymphocytes Relative: 7 %
Lymphs Abs: 1.3 10*3/uL (ref 0.7–4.0)
MCH: 29.6 pg (ref 26.0–34.0)
MCHC: 32.8 g/dL (ref 30.0–36.0)
MCV: 90.5 fL (ref 78.0–100.0)
Monocytes Absolute: 1.9 10*3/uL — ABNORMAL HIGH (ref 0.1–1.0)
Monocytes Relative: 10 %
Neutro Abs: 15.1 10*3/uL — ABNORMAL HIGH (ref 1.7–7.7)
Neutrophils Relative %: 83 %
Platelets: 192 10*3/uL (ref 150–400)
RBC: 3.98 MIL/uL (ref 3.87–5.11)
RDW: 14.8 % (ref 11.5–15.5)
WBC: 18.2 10*3/uL — ABNORMAL HIGH (ref 4.0–10.5)

## 2017-07-19 NOTE — ED Provider Notes (Signed)
[facility] DEPT Provider Note   CSN: [id number] Arrival date & time: 07/19/17  2308     History   Chief Complaint Chief Complaint  Patient presents with  . Weakness    HPI Kari [name] is a 56 y.o. female.  Patient with history of diastolic CHF status post cardiac arrest on July 4 now with AICD presenting with generalized weakness for the past one day. States she's had poor appetite, anorexia and generalized weakness. Unable to walk today which she normally can. Endorses poor by mouth intake and urine output. Denies any chest pain or shortness of breath. No abdominal pain. No fever. No focal weakness in her arms or legs. No difficulty speaking or swallowing. No fever, cough, urinary symptoms. She has chronic leg ulcers and leg swelling which are unchanged.   The history is provided by the patient.  Weakness  Primary symptoms include no dizziness. Pertinent negatives include no shortness of breath, no chest pain, no vomiting and no headaches.    Past Medical History:  Diagnosis Date  . Asthma   . CHF (congestive heart failure) ([place]) 03/12/2014  . Depression   . Headache(784.0)   . Hyperlipidemia   . Hypertension   . Lung nodules   . Renal disorder   . Sarcoidosis   . Ulcer    left posterior calf    Patient Active Problem List   Diagnosis Date Noted  . Shock circulatory ([place])   . Acute respiratory failure with hypoxia ([place])   . Pulmonary edema   . Cardiac arrest ([place]) 06/26/2017  . Ventricular fibrillation ([place])   . Acute on chronic diastolic heart failure ([place])   . Abnormal ECG   . Cellulitis 04/02/2017  . AKI (acute kidney injury) ([place]) 12/17/2016  . Cellulitis and abscess of right lower extremity 12/17/2016  . Syncope and collapse 12/17/2016  . Peripheral edema 12/17/2016  . Hypokalemia 12/17/2016  . Anemia 12/17/2016  . Acute diastolic CHF (congestive heart failure) ([place]) 03/14/2014  . CHF (congestive heart failure) ([place]) 03/12/2014  . Sarcoidosis  ([place]) 03/12/2014  . Severe sepsis ([place]) 10/17/2013  . ARF (acute renal failure) ([place]) 10/08/2013  . Cellulitis and abscess 10/08/2013  . Obesity 10/08/2013  . Volume depletion 10/08/2013  . Unspecified venous (peripheral) insufficiency 10/28/2012  . Mediastinal lymphadenopathy 10/16/2012  . Pulmonary nodules 10/16/2012  . 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 Chest pain 09/02/2012  . Asthma   . Hypertension   . Depression   . Venous stasis ulcers ([place]) 07/29/2012  . 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 Venous ulcer of leg ([place]) 07/08/2012    Past Surgical History:  Procedure Laterality Date  . cataract surgery Left 07-2013  . ICD IMPLANT N/A 07/05/2017   Procedure: ICD Implant;  Surgeon: Constance Haw, MD;  Location: [facility] CV LAB;  Service: Cardiovascular;  Laterality: N/A;  . LEFT HEART CATH AND CORONARY ANGIOGRAPHY N/A 07/03/2017   Procedure: Left Heart Cath and Coronary Angiography;  Surgeon: Belva Crome, MD;  Location: [facility] CV LAB;  Service: Cardiovascular;  Laterality: N/A;  . NO PAST SURGERIES      OB History    No data available       Home Medications    Prior to Admission medications   Medication Sig Start Date End Date Taking? Authorizing Provider  acetaminophen (TYLENOL) 325 MG tablet Take 2 tablets (650 mg total) by mouth every 6 (six) hours as  needed for mild pain (or Fever >/= 101). Patient not taking: Reported on 07/11/2017 04/06/17   Sinda Du, MD  albuterol (PROVENTIL HFA;VENTOLIN HFA) 108 [phone] Base) MCG/ACT inhaler Inhale 2 puffs into the lungs every 6 (six) hours as needed for shortness of breath. 07/06/17   Mikhail, Velta Addison, DO  Amino Acids-Protein Hydrolys (FEEDING SUPPLEMENT, PRO-STAT SUGAR FREE 64,) LIQD Take 60 mLs by mouth 2 (two) times daily between meals. Patient not taking: Reported on 07/11/2017 04/06/17   Sinda Du, MD  aspirin EC 81  MG tablet Take 81 mg by mouth daily.    [provider]  carvedilol (COREG) 12.5 MG tablet Take 1 tablet (12.5 mg total) by mouth 2 (two) times daily with a meal. 07/06/17   Mikhail, Velta Addison, DO  collagenase (SANTYL) ointment Apply 1 application topically as needed (for wound care). Apply to wound BLE per Tx 07/06/17   Cristal Ford, DO  furosemide (LASIX) 40 MG tablet Take 1 tablet (40 mg total) by mouth daily. 07/06/17   Cristal Ford, DO  HYDROcodone-acetaminophen (NORCO/VICODIN) 5-325 MG tablet Take 1-2 tablets by mouth every 6 (six) hours as needed for moderate pain. 07/06/17   Mikhail, Velta Addison, DO  lisinopril (PRINIVIL,ZESTRIL) 5 MG tablet Take 1 tablet (5 mg total) by mouth daily. Patient not taking: Reported on 07/11/2017 07/06/17   Cristal Ford, DO  loratadine (CLARITIN) 10 MG tablet Take 10 mg by mouth daily.    [provider]  Multiple Vitamin (MULTIVITAMIN WITH MINERALS) TABS tablet Take 1 tablet by mouth daily. Patient not taking: Reported on 07/11/2017 04/07/17   Sinda Du, MD  ondansetron (ZOFRAN) 4 MG tablet Take 1 tablet (4 mg total) by mouth every 6 (six) hours as needed for nausea. 04/06/17   Sinda Du, MD  potassium chloride SA (K-DUR,KLOR-CON) 20 MEQ tablet Take 60 mEq by mouth daily.    [provider]  predniSONE (DELTASONE) 10 MG tablet Take 2 pills for 2 days, then taper 1 pill for 2 days. Patient not taking: Reported on 07/11/2017 07/06/17   Cristal Ford, DO    Family History Family History  Problem Relation Age of Onset  . Heart failure Mother   . Diabetes Mellitus II Mother   . Hypertension Mother   . Cancer Mother        unknown type  . Heart disease Father   . Stroke Father   . Diabetes Mellitus II Father     Social History Social History  Substance Use Topics  . Smoking status: Never Smoker  . Smokeless tobacco: Never Used  . Alcohol use No     Allergies   Patient has no known allergies.   Review of  Systems Review of Systems  Constitutional: Positive for activity change, appetite change and fatigue. Negative for fever.  HENT: Negative for congestion and rhinorrhea.   Respiratory: Negative for cough, chest tightness and shortness of breath.   Cardiovascular: Negative for chest pain.  Gastrointestinal: Negative for abdominal pain, nausea and vomiting.  Genitourinary: Negative for dysuria, hematuria, vaginal bleeding and vaginal discharge.  Musculoskeletal: Negative for arthralgias, back pain and myalgias.  Skin: Negative for rash.  Neurological: Positive for weakness. Negative for dizziness, light-headedness and headaches.   all other systems are negative except as noted in the HPI and PMH.     Physical Exam Updated Vital Signs BP 111/65 (BP Location: Right Arm)   Pulse 86   Temp 99.5 F (37.5 C) (Oral)   Resp 18   Ht 5\' 7"  (  1.702 m)   Wt 122.5 kg (270 lb)   LMP 11/21/2011   SpO2 98%   BMI 42.29 kg/m   Physical Exam  Constitutional: She is oriented to person, place, and time. She appears well-developed and well-nourished. No distress.  HENT:  Head: Normocephalic and atraumatic.  Mouth/Throat: Oropharynx is clear and moist. No oropharyngeal exudate.  Eyes: Pupils are equal, round, and reactive to light. Conjunctivae and EOM are normal.  Neck: Normal range of motion. Neck supple.  No meningismus.  Cardiovascular: Normal rate, regular rhythm and intact distal pulses.   Murmur heard. AICD L chest  Pulmonary/Chest: Effort normal and breath sounds normal. No respiratory distress.  Abdominal: Soft. She exhibits distension. There is no tenderness. There is no rebound and no guarding.  Musculoskeletal: Normal range of motion. She exhibits edema. She exhibits no tenderness.  Chronic ulcers to LE with clear drainage  Neurological: She is alert and oriented to person, place, and time. No cranial nerve deficit. She exhibits normal muscle tone. Coordination normal.   5/5 strength  throughout. CN 2-12 intact.Equal grip strength.   Skin: Skin is warm.  Psychiatric: She has a normal mood and affect. Her behavior is normal.  Nursing note and vitals reviewed.    ED Treatments / Results  Labs (all labs ordered are listed, but only abnormal results are displayed) Labs Reviewed  URINALYSIS, ROUTINE W REFLEX MICROSCOPIC - Abnormal; Notable for the following:       Result Value   Color, Urine AMBER (*)    APPearance CLOUDY (*)    Protein, ur 100 (*)    Leukocytes, UA MODERATE (*)    Bacteria, UA RARE (*)    Squamous Epithelial / LPF TOO NUMEROUS TO COUNT (*)    All other components within normal limits  CBC WITH DIFFERENTIAL/PLATELET - Abnormal; Notable for the following:    WBC 18.2 (*)    Hemoglobin 11.8 (*)    Neutro Abs 15.1 (*)    Monocytes Absolute 1.9 (*)    All other components within normal limits  COMPREHENSIVE METABOLIC PANEL - Abnormal; Notable for the following:    Chloride 100 (*)    Glucose, Bld 146 (*)    BUN 30 (*)    Creatinine, Ser 1.71 (*)    Total Protein 8.3 (*)    Total Bilirubin 2.2 (*)    GFR calc non Af Amer 33 (*)    GFR calc Af Amer 38 (*)    All other components within normal limits  TROPONIN I - Abnormal; Notable for the following:    Troponin I 0.04 (*)    All other components within normal limits  D-DIMER, QUANTITATIVE (NOT AT [facility]) - Abnormal; Notable for the following:    D-Dimer, Quant 2.40 (*)    All other components within normal limits  URINE CULTURE  BRAIN NATRIURETIC PEPTIDE    EKG  EKG Interpretation  Date/Time:  Friday July 19 2017 23:36:11 EDT Ventricular Rate:  85 PR Interval:    QRS Duration: 98 QT Interval:  356 QTC Calculation: 424 R Axis:   -7 Text Interpretation:  Sinus rhythm LVH with secondary repolarization abnormality Inferior infarct, old No significant change was found Confirmed by Ezequiel Essex [id number]) on 07/19/2017 11:54:22 PM       Radiology Dg Chest 2 View  Result Date:  07/20/2017 CLINICAL DATA:  Generalized weakness, onset this morning. Recent placement of a defibrillator. EXAM: CHEST  2 VIEW COMPARISON:  07/06/2017 FINDINGS: Grossly intact transvenous cardiac lead.  Mild chronic interstitial coarsening. No airspace consolidation. No effusion. Unchanged hilar, mediastinal and cardiac contours. IMPRESSION: No consolidation or effusion.  Intact transvenous ICD lead. Electronically Signed   By: Andreas Newport M.D.   On: 07/20/2017 00:44   Dg Abd 2 Views  Result Date: 07/20/2017 CLINICAL DATA:  Abdominal distention. Generalize weakness since this morning. History of sarcoidosis, hypertension, CHF. EXAM: ABDOMEN - 2 VIEW COMPARISON:  CT abdomen and pelvis 04/01/2017 FINDINGS: Paucity of gas in the abdomen. No gaseous distention of large or small bowel. Scattered gas and stool in the colon. No free intra-abdominal air. No abnormal air-fluid levels. No radiopaque stones. Large soft tissue density suggested over the abdomen likely corresponds to the large cystic pelvic mass seen on previous CT if this has not been resected in the interval. Degenerative changes in the spine and hips. IMPRESSION: Nonobstructive bowel gas pattern. Soft tissue attenuation over the lower abdomen likely corresponding to known cystic pelvic/abdominal mass lesion. Electronically Signed   By: Lucienne Capers M.D.   On: 07/20/2017 00:46    Procedures Procedures (including critical care time)  Medications Ordered in ED Medications - No data to display   Initial Impression / Assessment and Plan / ED Course  I have reviewed the triage vital signs and the nursing notes.  Pertinent labs & imaging results that were available during my care of the patient were reviewed by me and considered in my medical decision making (see chart for details).    Patient with recent cardiac arrest presents with 1 day of generalized weakness. Denies chest pain or shortness of breath. EKG sinus rhythm unchanged from  previous.  Labs show stable hemoglobin, slightly worsening renal failure, possible urinary tract infection.  Patient given gentle hydration. EF 40-45%.  Orthostatics negative. Patient with mild ongoing tachypnea. No hypoxia or tachycardia. Chest x-ray is clear. D-dimer elevated.  Elevated Cr precluded use of IV contrast. Will plan for VQ scan in AM. AICD interrogated and no arrhythmias or shocks seen.  She needs assistance to stand and cannot ambulate. Patient possibly has some over diuresis. We'll hydrate gently. Plan observation admission overnight. Discussed with Dr. Darrick Meigs.   Final Clinical Impressions(s) / ED Diagnoses   Final diagnoses:  Generalized weakness  AKI (acute kidney injury) [facility])    New Prescriptions New Prescriptions   No medications on file     Ezequiel Essex, MD 07/20/17 [phone]

## 2017-07-19 NOTE — ED Triage Notes (Signed)
Pt states she has had generalized weakness since she got up this morning approx 6 am, states she is more weak in her legs, has not walked as well today.  Pt had a defibrillator placed July 13th.

## 2017-07-20 ENCOUNTER — Observation Stay (HOSPITAL_COMMUNITY): Payer: Medicare Other

## 2017-07-20 ENCOUNTER — Encounter (HOSPITAL_COMMUNITY): Payer: Self-pay

## 2017-07-20 DIAGNOSIS — R531 Weakness: Secondary | ICD-10-CM | POA: Diagnosis not present

## 2017-07-20 DIAGNOSIS — R140 Abdominal distension (gaseous): Secondary | ICD-10-CM | POA: Diagnosis not present

## 2017-07-20 DIAGNOSIS — N179 Acute kidney failure, unspecified: Principal | ICD-10-CM

## 2017-07-20 DIAGNOSIS — R079 Chest pain, unspecified: Secondary | ICD-10-CM | POA: Diagnosis not present

## 2017-07-20 LAB — COMPREHENSIVE METABOLIC PANEL
ALT: 14 U/L (ref 14–54)
AST: 18 U/L (ref 15–41)
Albumin: 3.6 g/dL (ref 3.5–5.0)
Alkaline Phosphatase: 69 U/L (ref 38–126)
Anion gap: 12 (ref 5–15)
BUN: 30 mg/dL — ABNORMAL HIGH (ref 6–20)
CO2: 25 mmol/L (ref 22–32)
Calcium: 9.7 mg/dL (ref 8.9–10.3)
Chloride: 100 mmol/L — ABNORMAL LOW (ref 101–111)
Creatinine, Ser: 1.71 mg/dL — ABNORMAL HIGH (ref 0.44–1.00)
GFR calc Af Amer: 38 mL/min — ABNORMAL LOW (ref >60)
GFR calc non Af Amer: 33 mL/min — ABNORMAL LOW (ref >60)
Glucose, Bld: 146 mg/dL — ABNORMAL HIGH (ref 65–99)
Potassium: 4.1 mmol/L (ref 3.5–5.1)
Sodium: 137 mmol/L (ref 135–145)
Total Bilirubin: 2.2 mg/dL — ABNORMAL HIGH (ref 0.3–1.2)
Total Protein: 8.3 g/dL — ABNORMAL HIGH (ref 6.5–8.1)

## 2017-07-20 LAB — URINALYSIS, ROUTINE W REFLEX MICROSCOPIC
Bilirubin Urine: NEGATIVE
Glucose, UA: NEGATIVE mg/dL
Hgb urine dipstick: NEGATIVE
Ketones, ur: NEGATIVE mg/dL
Nitrite: NEGATIVE
Protein, ur: 100 mg/dL — AB
Specific Gravity, Urine: 1.028 (ref 1.005–1.030)
pH: 5 (ref 5.0–8.0)

## 2017-07-20 LAB — TROPONIN I
Troponin I: 0.04 ng/mL (ref <0.03)
Troponin I: <0.03 ng/mL (ref <0.03)
Troponin I: <0.03 ng/mL (ref <0.03)
Troponin I: <0.03 ng/mL (ref <0.03)

## 2017-07-20 LAB — D-DIMER, QUANTITATIVE: D-Dimer, Quant: 2.4 ug/mL-FEU — ABNORMAL HIGH (ref 0.00–0.50)

## 2017-07-20 LAB — BRAIN NATRIURETIC PEPTIDE: B Natriuretic Peptide: 46 pg/mL (ref 0.0–100.0)

## 2017-07-20 MED ORDER — TECHNETIUM TC 99M DIETHYLENETRIAME-PENTAACETIC ACID
30.0000 | Freq: Once | INTRAVENOUS | Status: AC | PRN
  Administered 2017-07-20: 30 via INTRAVENOUS

## 2017-07-20 MED ORDER — ENOXAPARIN SODIUM 120 MG/0.8ML ~~LOC~~ SOLN
120.0000 mg | Freq: Two times a day (BID) | SUBCUTANEOUS | Status: DC
  Administered 2017-07-21 – 2017-07-22 (×3): 120 mg via SUBCUTANEOUS
  Filled 2017-07-20 (×4): qty 0.8

## 2017-07-20 MED ORDER — ONDANSETRON HCL 4 MG PO TABS: 4.0000 mg | ORAL_TABLET | Freq: Four times a day (QID) | ORAL | Status: DC | PRN

## 2017-07-20 MED ORDER — ENOXAPARIN SODIUM 60 MG/0.6ML ~~LOC~~ SOLN
60.0000 mg | Freq: Once | SUBCUTANEOUS | Status: AC
  Administered 2017-07-20: 19:00:00 60 mg via SUBCUTANEOUS
  Filled 2017-07-20: qty 0.6

## 2017-07-20 MED ORDER — ENOXAPARIN SODIUM 60 MG/0.6ML ~~LOC~~ SOLN
60.0000 mg | SUBCUTANEOUS | Status: DC
  Administered 2017-07-20: 60 mg via SUBCUTANEOUS
  Filled 2017-07-20: qty 0.6

## 2017-07-20 MED ORDER — ALBUTEROL SULFATE (2.5 MG/3ML) 0.083% IN NEBU: 2.5000 mg | INHALATION_SOLUTION | Freq: Four times a day (QID) | RESPIRATORY_TRACT | Status: DC | PRN

## 2017-07-20 MED ORDER — CARVEDILOL 12.5 MG PO TABS
12.5000 mg | ORAL_TABLET | Freq: Two times a day (BID) | ORAL | Status: DC
  Filled 2017-07-20 (×2): qty 1
  Filled 2017-07-20: qty 4
  Filled 2017-07-20 (×4): qty 1

## 2017-07-20 MED ORDER — SODIUM CHLORIDE 0.9 % IV SOLN
INTRAVENOUS | Status: DC
  Administered 2017-07-20 – 2017-07-22 (×4): via INTRAVENOUS

## 2017-07-20 MED ORDER — COLLAGENASE 250 UNIT/GM EX OINT
1.0000 "application " | TOPICAL_OINTMENT | CUTANEOUS | Status: DC | PRN
  Administered 2017-07-22 – 2017-07-23 (×2): 1 via TOPICAL
  Filled 2017-07-20: qty 30

## 2017-07-20 MED ORDER — PNEUMOCOCCAL VAC POLYVALENT 25 MCG/0.5ML IJ INJ
0.5000 mL | INJECTION | INTRAMUSCULAR | Status: AC
  Administered 2017-07-21: 0.5 mL via INTRAMUSCULAR
  Filled 2017-07-20: qty 0.5

## 2017-07-20 MED ORDER — ASPIRIN EC 81 MG PO TBEC
81.0000 mg | DELAYED_RELEASE_TABLET | Freq: Every day | ORAL | Status: DC
  Administered 2017-07-20 – 2017-07-24 (×5): 81 mg via ORAL
  Filled 2017-07-20 (×5): qty 1

## 2017-07-20 MED ORDER — DEXTROSE 5 % IV SOLN
1.0000 g | Freq: Once | INTRAVENOUS | Status: AC
  Administered 2017-07-20: 1 g via INTRAVENOUS
  Filled 2017-07-20: qty 10

## 2017-07-20 MED ORDER — DEXTROSE 5 % IV SOLN
1.0000 g | INTRAVENOUS | Status: DC
  Administered 2017-07-21 – 2017-07-24 (×4): 1 g via INTRAVENOUS
  Filled 2017-07-20 (×5): qty 10

## 2017-07-20 MED ORDER — ACETAMINOPHEN 325 MG PO TABS
650.0000 mg | ORAL_TABLET | Freq: Four times a day (QID) | ORAL | Status: DC | PRN
  Administered 2017-07-22 – 2017-07-23 (×2): 650 mg via ORAL
  Filled 2017-07-20 (×2): qty 2

## 2017-07-20 MED ORDER — ONDANSETRON HCL 4 MG/2ML IJ SOLN
4.0000 mg | Freq: Four times a day (QID) | INTRAMUSCULAR | Status: DC | PRN
  Administered 2017-07-21: 4 mg via INTRAVENOUS
  Filled 2017-07-20: qty 2

## 2017-07-20 MED ORDER — ALBUTEROL SULFATE HFA 108 (90 BASE) MCG/ACT IN AERS: 2.0000 | INHALATION_SPRAY | Freq: Four times a day (QID) | RESPIRATORY_TRACT | Status: DC | PRN

## 2017-07-20 MED ORDER — TECHNETIUM TO 99M ALBUMIN AGGREGATED
4.0000 | Freq: Once | INTRAVENOUS | Status: AC | PRN
  Administered 2017-07-20: 3.87 via INTRAVENOUS

## 2017-07-20 MED ORDER — SODIUM CHLORIDE 0.9 % IV BOLUS (SEPSIS)
500.0000 mL | Freq: Once | INTRAVENOUS | Status: AC
  Administered 2017-07-20: 500 mL via INTRAVENOUS

## 2017-07-20 NOTE — H&P (Addendum)
TRH H&P    Patient Demographics:    Kari [name], is a 56 y.o. female  MRN: [id number]  DOB - 1961-10-04  Admit Date - 07/19/2017  Referring MD/NP/PA: Dr Wyvonnia Dusky  Outpatient Primary MD for the patient is Rosita Fire, MD  Patient coming from: Home  Chief Complaint  Patient presents with  . Weakness      HPI:    Kari [name]  is a 56 y.o. female, With history of chronic systolic and diastolic CHF status post cardiac arrest on July 4 noted AICD him to generalized weakness on 1 day duration. Patient said that she has had poor appetite and developed generalized weakness. She was dizzy when walking but denies any passing out on regard vision. No chest pain or shortness of breath. Denies dysuria. No fever. No focal weakness of the arms or legs.  In the ED, patient was found to have acute kidney injury with creatinine 1.71, troponin 0.04, d-dimer was elevated 2.40. Also had abnormal UA and started on ceftriaxone.  She denies nausea vomiting or diarrhea.    Review of systems:      All other systems reviewed and are negative.   With Past History of the following :    Past Medical History:  Diagnosis Date  . Asthma   . CHF (congestive heart failure) ([place]) 03/12/2014  . Depression   . Headache(784.0)   . Hyperlipidemia   . Hypertension   . Lung nodules   . Renal disorder   . Sarcoidosis   . Ulcer    left posterior calf      Past Surgical History:  Procedure Laterality Date  . cataract surgery Left 07-2013  . ICD IMPLANT N/A 07/05/2017   Procedure: ICD Implant;  Surgeon: Constance Haw, MD;  Location: [facility];  Service: Cardiovascular;  Laterality: N/A;  . LEFT HEART CATH AND CORONARY ANGIOGRAPHY N/A 07/03/2017   Procedure: Left Heart Cath and Coronary Angiography;  Surgeon: Belva Crome, MD;  Location: [facility];  Service: Cardiovascular;  Laterality: N/A;  .  NO PAST SURGERIES        Social History:      Social History  Substance Use Topics  . Smoking status: Never Smoker  . Smokeless tobacco: Never Used  . Alcohol use No       Family History :     Family History  Problem Relation Age of Onset  . Heart failure Mother   . Diabetes Mellitus II Mother   . Hypertension Mother   . Cancer Mother        unknown type  . Heart disease Father   . Stroke Father   . Diabetes Mellitus II Father       Home Medications:   Prior to Admission medications   Medication Sig Start Date End Date Taking? Authorizing Provider  acetaminophen (TYLENOL) 325 MG tablet Take 2 tablets (650 mg total) by mouth every 6 (six) hours as needed for mild pain (or Fever >/= 101). Patient not taking: Reported on 07/11/2017 04/06/17  Sinda Du, MD  albuterol (PROVENTIL HFA;VENTOLIN HFA) 108 (90 Base) MCG/ACT inhaler Inhale 2 puffs into the lungs every 6 (six) hours as needed for shortness of breath. 07/06/17   Mikhail, Velta Addison, DO  Amino Acids-Protein Hydrolys (FEEDING SUPPLEMENT, PRO-STAT SUGAR FREE 64,) LIQD Take 60 mLs by mouth 2 (two) times daily between meals. Patient not taking: Reported on 07/11/2017 04/06/17   Sinda Du, MD  aspirin EC 81 MG tablet Take 81 mg by mouth daily.    [provider]  carvedilol (COREG) 12.5 MG tablet Take 1 tablet (12.5 mg total) by mouth 2 (two) times daily with a meal. 07/06/17   Mikhail, Velta Addison, DO  collagenase (SANTYL) ointment Apply 1 application topically as needed (for wound care). Apply to wound BLE per Tx 07/06/17   Cristal Ford, DO  furosemide (LASIX) 40 MG tablet Take 1 tablet (40 mg total) by mouth daily. 07/06/17   Cristal Ford, DO  HYDROcodone-acetaminophen (NORCO/VICODIN) 5-325 MG tablet Take 1-2 tablets by mouth every 6 (six) hours as needed for moderate pain. 07/06/17   Mikhail, Velta Addison, DO  lisinopril (PRINIVIL,ZESTRIL) 5 MG tablet Take 1 tablet (5 mg total) by mouth daily. Patient not  taking: Reported on 07/11/2017 07/06/17   Cristal Ford, DO  loratadine (CLARITIN) 10 MG tablet Take 10 mg by mouth daily.    [provider]  Multiple Vitamin (MULTIVITAMIN WITH MINERALS) TABS tablet Take 1 tablet by mouth daily. Patient not taking: Reported on 07/11/2017 04/07/17   Sinda Du, MD  ondansetron (ZOFRAN) 4 MG tablet Take 1 tablet (4 mg total) by mouth every 6 (six) hours as needed for nausea. 04/06/17   Sinda Du, MD  potassium chloride SA (K-DUR,KLOR-CON) 20 MEQ tablet Take 60 mEq by mouth daily.    [provider]  predniSONE (DELTASONE) 10 MG tablet Take 2 pills for 2 days, then taper 1 pill for 2 days. Patient not taking: Reported on 07/11/2017 07/06/17   Cristal Ford, DO     Allergies:    No Known Allergies   Physical Exam:   Vitals  Blood pressure 104/64, pulse 80, temperature 99.5 F (37.5 C), temperature source Oral, resp. rate (!) 24, height 5\' 7"  (1.702 m), weight 122.5 kg (270 lb), last menstrual period 11/21/2011, SpO2 97 %.  1.  General: Appears in no acute distress  2. Psychiatric:  Intact judgement and  insight, awake alert, oriented x 3.  3. Neurologic: No focal neurological deficits, all cranial nerves intact.Strength 5/5 all 4 extremities, sensation intact all 4 extremities, plantars down going.  4. Eyes :  anicteric sclerae, moist conjunctivae with no lid lag. PERRLA.  5. ENMT:  Oropharynx clear with moist mucous membranes and good dentition  6. Neck:  supple, no cervical lymphadenopathy appriciated, No thyromegaly  7. Respiratory : Normal respiratory effort, good air movement bilaterally,clear to  auscultation bilaterally  8. Cardiovascular : RRR, no gallops, rubs or murmurs, no leg edema  9. Gastrointestinal:  Positive bowel sounds, abdomen soft, non-tender to palpation,no hepatosplenomegaly, no rigidity or guarding       10. Skin:  Both lower extremities in dressing  11.Musculoskeletal:  Good muscle  tone,  joints appear normal , no effusions,  normal range of motion    Data Review:    CBC  Recent Labs Lab 07/19/17 2345  WBC 18.2*  HGB 11.8*  HCT 36.0  PLT 192  MCV 90.5  MCH 29.6  MCHC 32.8  RDW 14.8  LYMPHSABS 1.3  MONOABS 1.9*  EOSABS 0.0  BASOSABS 0.0   ------------------------------------------------------------------------------------------------------------------  Chemistries   Recent Labs Lab 07/19/17 2345  NA 137  K 4.1  CL 100*  CO2 25  GLUCOSE 146*  BUN 30*  CREATININE 1.71*  CALCIUM 9.7  AST 18  ALT 14  ALKPHOS 69  BILITOT 2.2*   ------------------------------------------------------------------------------------------------------------------  ------------------------------------------------------------------------------------------------------------------ GFR: Estimated Creatinine Clearance: 50.5 mL/min (A) (by C-G formula based on SCr of 1.71 mg/dL (H)). Liver Function Tests:  Recent Labs Lab 07/19/17 2345  AST 18  ALT 14  ALKPHOS 69  BILITOT 2.2*  PROT 8.3*  ALBUMIN 3.6     Recent Labs Lab 07/19/17 2345  TROPONINI 0.04*    --------------------------------------------------------------------------------------------------------------- Urine analysis:    Component Value Date/Time   COLORURINE AMBER (A) 07/19/2017 2344   APPEARANCEUR CLOUDY (A) 07/19/2017 2344   LABSPEC 1.028 07/19/2017 2344   PHURINE 5.0 07/19/2017 2344   GLUCOSEU NEGATIVE 07/19/2017 2344   HGBUR NEGATIVE 07/19/2017 2344   [place] NEGATIVE 07/19/2017 2344   [place] 07/19/2017 2344   PROTEINUR 100 (A) 07/19/2017 2344   UROBILINOGEN 0.2 04/10/2014 0949   NITRITE NEGATIVE 07/19/2017 2344   LEUKOCYTESUR MODERATE (A) 07/19/2017 2344      Imaging Results:    Dg Chest 2 View  Result Date: 07/20/2017 CLINICAL DATA:  Generalized weakness, onset this morning. Recent placement of a defibrillator. EXAM: CHEST  2 VIEW COMPARISON:  07/06/2017  FINDINGS: Grossly intact transvenous cardiac lead. Mild chronic interstitial coarsening. No airspace consolidation. No effusion. Unchanged hilar, mediastinal and cardiac contours. IMPRESSION: No consolidation or effusion.  Intact transvenous ICD lead. Electronically Signed   By: Andreas Newport M.D.   On: 07/20/2017 00:44   Dg Abd 2 Views  Result Date: 07/20/2017 CLINICAL DATA:  Abdominal distention. Generalize weakness since this morning. History of sarcoidosis, hypertension, CHF. EXAM: ABDOMEN - 2 VIEW COMPARISON:  CT abdomen and pelvis 04/01/2017 FINDINGS: Paucity of gas in the abdomen. No gaseous distention of large or small bowel. Scattered gas and stool in the colon. No free intra-abdominal air. No abnormal air-fluid levels. No radiopaque stones. Large soft tissue density suggested over the abdomen likely corresponds to the large cystic pelvic mass seen on previous CT if this has not been resected in the interval. Degenerative changes in the spine and hips. IMPRESSION: Nonobstructive bowel gas pattern. Soft tissue attenuation over the lower abdomen likely corresponding to known cystic pelvic/abdominal mass lesion. Electronically Signed   By: Lucienne Capers M.D.   On: 07/20/2017 00:46    My personal review of EKG: Rhythm NSR   Assessment & Plan:    Active Problems:   Generalized weakness   1. Generalized weakness-  from dehydration from diuretics. Started on gentle IV hydration with normal saline at 75 ML per hour. 2. Acute kidney injury- patient'[name] creatinine elevated 1.71. Baseline creatinine is around 1.2. Started on gentle IV hydration as well. Follow BMP in am. 3. Elevated d-dimer - patient has elevated d-dimer, unable to obtain CTA chest due to worsening renal function. VQ scan has been ordered. Follow results. 4.  Chronic systolic CHF - stable, Lasix currently on hold due to above. Patient has mild elevation of troponin 0.04, cycle troponin every 6 hours 3 5.  UTI- patient has  abnormal UA, started on ceftriaxone. Follow urine culture results. 6. Hypertension- continue Coreg, lisinopril is on hold due to worsening renal function. 7. Chronic bilateral lower extremity venous ulcers- we'll consult wound care in a.m. 8. 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 Patient to follow-up with  GYN as outpatient   DVT Prophylaxis-   Lovenox   AM Labs Ordered, also please review Full Orders  Family Communication: Admission, patients condition and plan of care including tests being ordered have been discussed with the patient  who indicate understanding and agree with the plan and Code Status.  Code Status:  Full code  Admission status: Observation    Time spent in minutes : 60 minutes   Kari [name] M.D on 07/20/2017 at 3:57 AM  Between 7am to 7pm - Pager - [phone]. After 7pm go to [email or web address] - password [facility]  [facility] - Office  [phone]

## 2017-07-20 NOTE — Progress Notes (Signed)
Pharmacy Antibiotic Note  Kari [name] is a 56 y.o. female admitted on 07/19/2017 with UTI.  Pharmacy has been consulted for Ceftriaxone dosing.  Plan: Ceftriaxone 1gm IV q24h F/U cxs and clinical progress  Height: 5\' 7"  (170.2 cm) Weight: 272 lb 4.3 oz (123.5 kg) IBW/kg (Calculated) : 61.6  Temp (24hrs), Avg:99.5 F (37.5 C), Min:99.5 F (37.5 C), Max:99.5 F (37.5 C)   Recent Labs Lab 07/19/17 2345  WBC 18.2*  CREATININE 1.71*    Estimated Creatinine Clearance: 50.7 mL/min (A) (by C-G formula based on SCr of 1.71 mg/dL (H)).    No Known Allergies  Antimicrobials this admission: Ceftriaxone 7/28 >>   Microbiology results: 7/28 UCx: pending  Thank you for allowing pharmacy to be a part of this patient's care.  Isac Sarna, BS Pharm D, [place] Clinical Pharmacist Pager [phone] 07/20/2017 11:30 AM

## 2017-07-20 NOTE — Progress Notes (Signed)
ANTICOAGULATION CONSULT NOTE - Initial Consult  Pharmacy Consult for Lovenox Indication: pulmonary embolus  No Known Allergies  Patient Measurements: Height: 5\' 7"  (170.2 cm) Weight: 272 lb 4.3 oz (123.5 kg) IBW/kg (Calculated) : 61.6  Vital Signs: Temp: 98 F (36.7 C) (07/28 1340) Temp Source: Oral (07/28 1340) BP: 106/63 (07/28 1340) Pulse Rate: 83 (07/28 1340)  Labs:  Recent Labs  07/19/17 2345 07/20/17 0753 07/20/17 1259  HGB 11.8*  --   --   HCT 36.0  --   --   PLT 192  --   --   CREATININE 1.71*  --   --   TROPONINI 0.04* <0.03 <0.03    Estimated Creatinine Clearance: 50.7 mL/min (A) (by C-G formula based on SCr of 1.71 mg/dL (H)).   Medical History: Past Medical History:  Diagnosis Date  . Asthma   . CHF (congestive heart failure) ([place]) 03/12/2014  . Depression   . Headache(784.0)   . Hyperlipidemia   . Hypertension   . Lung nodules   . Renal disorder   . Sarcoidosis   . Ulcer    left posterior calf    Medications:  Prescriptions Prior to Admission  Medication Sig Dispense Refill Last Dose  . albuterol (PROVENTIL HFA;VENTOLIN HFA) 108 (90 Base) MCG/ACT inhaler Inhale 2 puffs into the lungs every 6 (six) hours as needed for shortness of breath. 18 g 0 unknown  . Amino Acids-Protein Hydrolys (FEEDING SUPPLEMENT, PRO-STAT SUGAR FREE 64,) LIQD Take 60 mLs by mouth 2 (two) times daily between meals. 900 mL 0 07/19/2017 at Unknown time  . aspirin EC 81 MG tablet Take 81 mg by mouth daily.   07/19/2017 at Unknown time  . carvedilol (COREG) 12.5 MG tablet Take 1 tablet (12.5 mg total) by mouth 2 (two) times daily with a meal. 60 tablet 0 07/19/2017 at 1800  . collagenase (SANTYL) ointment Apply 1 application topically as needed (for wound care). Apply to wound BLE per Tx 15 g 0 07/19/2017 at Unknown time  . furosemide (LASIX) 40 MG tablet Take 1 tablet (40 mg total) by mouth daily. 30 tablet 0 07/19/2017 at Unknown time  . HYDROcodone-acetaminophen  (NORCO/VICODIN) 5-325 MG tablet Take 1-2 tablets by mouth every 6 (six) hours as needed for moderate pain. 20 tablet 0 07/19/2017 at Unknown time  . lisinopril (PRINIVIL,ZESTRIL) 5 MG tablet Take 1 tablet (5 mg total) by mouth daily. 30 tablet 0 07/19/2017 at Unknown time  . loratadine (CLARITIN) 10 MG tablet Take 10 mg by mouth daily.   07/19/2017 at Unknown time  . ondansetron (ZOFRAN) 4 MG tablet Take 1 tablet (4 mg total) by mouth every 6 (six) hours as needed for nausea. 20 tablet 0 unknown  . potassium chloride SA (K-DUR,KLOR-CON) 20 MEQ tablet Take 60 mEq by mouth daily.   07/19/2017 at Unknown time    Assessment: 56 yo female who presented to the ED with weakness.  Elevated d-dimer at 2.4. VQ scan shows a mismatch giving the impression of Pulmonary embolus. Pharmacy asked to start lovenox for treatment. Patient received lovenox 60mg  sq at 1300.  Goal of Therapy:  Monitor platelets by anticoagulation protocol: Yes   Plan:  Lovenox 60mg  now SQ, then 1mg /kg (120mg ) SQ q12h Monitor for S/S of bleeding CBC in AM  Isac Sarna, BS [place], BCPS Clinical Pharmacist Pager [phone] 07/20/2017,6:40 PM

## 2017-07-20 NOTE — ED Notes (Signed)
Pt unable to ambulate at this time due to feeling light headed.

## 2017-07-20 NOTE — Progress Notes (Signed)
This is an assumption of care note. This is a 56 year old who was admitted to the hospital with dehydration shortness of breath elevated d-dimer. She says she feels much better. She had ventilation/perfusion lung scan that I don't have the results yet. She had acute kidney injury I think from dehydration.

## 2017-07-20 NOTE — ED Notes (Signed)
[facility] called back and stated that her interrogation looked good, no abnormalities.

## 2017-07-20 NOTE — Progress Notes (Signed)
Dr. Cindie Laroche notified of VQ Lung results, order received.

## 2017-07-21 ENCOUNTER — Observation Stay (HOSPITAL_COMMUNITY): Payer: Medicare Other

## 2017-07-21 DIAGNOSIS — T502X5A Adverse effect of carbonic-anhydrase inhibitors, benzothiadiazides and other diuretics, initial encounter: Secondary | ICD-10-CM | POA: Diagnosis present

## 2017-07-21 DIAGNOSIS — E876 Hypokalemia: Secondary | ICD-10-CM | POA: Diagnosis not present

## 2017-07-21 DIAGNOSIS — J45909 Unspecified asthma, uncomplicated: Secondary | ICD-10-CM | POA: Diagnosis present

## 2017-07-21 DIAGNOSIS — R7989 Other specified abnormal findings of blood chemistry: Secondary | ICD-10-CM | POA: Diagnosis not present

## 2017-07-21 DIAGNOSIS — Z23 Encounter for immunization: Secondary | ICD-10-CM | POA: Diagnosis not present

## 2017-07-21 DIAGNOSIS — N179 Acute kidney failure, unspecified: Secondary | ICD-10-CM | POA: Diagnosis present

## 2017-07-21 DIAGNOSIS — I83022 Varicose veins of left lower extremity with ulcer of calf: Secondary | ICD-10-CM | POA: Diagnosis present

## 2017-07-21 DIAGNOSIS — Z823 Family history of stroke: Secondary | ICD-10-CM | POA: Diagnosis not present

## 2017-07-21 DIAGNOSIS — Z95810 Presence of automatic (implantable) cardiac defibrillator: Secondary | ICD-10-CM | POA: Diagnosis not present

## 2017-07-21 DIAGNOSIS — Z8249 Family history of ischemic heart disease and other diseases of the circulatory system: Secondary | ICD-10-CM | POA: Diagnosis not present

## 2017-07-21 DIAGNOSIS — Y92009 Unspecified place in unspecified non-institutional (private) residence as the place of occurrence of the external cause: Secondary | ICD-10-CM | POA: Diagnosis not present

## 2017-07-21 DIAGNOSIS — N390 Urinary tract infection, site not specified: Secondary | ICD-10-CM | POA: Diagnosis present

## 2017-07-21 DIAGNOSIS — Z6841 Body Mass Index (BMI) 40.0 and over, adult: Secondary | ICD-10-CM | POA: Diagnosis not present

## 2017-07-21 DIAGNOSIS — I340 Nonrheumatic mitral (valve) insufficiency: Secondary | ICD-10-CM | POA: Diagnosis present

## 2017-07-21 DIAGNOSIS — I509 Heart failure, unspecified: Secondary | ICD-10-CM | POA: Diagnosis not present

## 2017-07-21 DIAGNOSIS — I110 Hypertensive heart disease with heart failure: Secondary | ICD-10-CM | POA: Diagnosis present

## 2017-07-21 DIAGNOSIS — R140 Abdominal distension (gaseous): Secondary | ICD-10-CM | POA: Diagnosis not present

## 2017-07-21 DIAGNOSIS — E860 Dehydration: Secondary | ICD-10-CM | POA: Diagnosis not present

## 2017-07-21 DIAGNOSIS — I5042 Chronic combined systolic (congestive) and diastolic (congestive) heart failure: Secondary | ICD-10-CM | POA: Diagnosis not present

## 2017-07-21 DIAGNOSIS — Z8674 Personal history of sudden cardiac arrest: Secondary | ICD-10-CM | POA: Diagnosis not present

## 2017-07-21 DIAGNOSIS — L97229 Non-pressure chronic ulcer of left calf with unspecified severity: Secondary | ICD-10-CM | POA: Diagnosis present

## 2017-07-21 DIAGNOSIS — D869 Sarcoidosis, unspecified: Secondary | ICD-10-CM | POA: Diagnosis present

## 2017-07-21 LAB — CBC
HCT: 30.4 % — ABNORMAL LOW (ref 36.0–46.0)
Hemoglobin: 10.1 g/dL — ABNORMAL LOW (ref 12.0–15.0)
MCH: 30.1 pg (ref 26.0–34.0)
MCHC: 33.2 g/dL (ref 30.0–36.0)
MCV: 90.7 fL (ref 78.0–100.0)
Platelets: 202 10*3/uL (ref 150–400)
RBC: 3.35 MIL/uL — ABNORMAL LOW (ref 3.87–5.11)
RDW: 15.1 % (ref 11.5–15.5)
WBC: 13.9 10*3/uL — ABNORMAL HIGH (ref 4.0–10.5)

## 2017-07-21 LAB — BASIC METABOLIC PANEL
Anion gap: 9 (ref 5–15)
BUN: 26 mg/dL — ABNORMAL HIGH (ref 6–20)
CO2: 27 mmol/L (ref 22–32)
Calcium: 9 mg/dL (ref 8.9–10.3)
Chloride: 103 mmol/L (ref 101–111)
Creatinine, Ser: 1.34 mg/dL — ABNORMAL HIGH (ref 0.44–1.00)
GFR calc Af Amer: 51 mL/min — ABNORMAL LOW (ref >60)
GFR calc non Af Amer: 44 mL/min — ABNORMAL LOW (ref >60)
Glucose, Bld: 126 mg/dL — ABNORMAL HIGH (ref 65–99)
Potassium: 3.9 mmol/L (ref 3.5–5.1)
Sodium: 139 mmol/L (ref 135–145)

## 2017-07-21 LAB — URINE CULTURE

## 2017-07-21 MED ORDER — IOPAMIDOL (ISOVUE-370) INJECTION 76%
100.0000 mL | Freq: Once | INTRAVENOUS | Status: AC | PRN
  Administered 2017-07-21: 100 mL via INTRAVENOUS

## 2017-07-21 MED ORDER — SODIUM CHLORIDE 0.9 % IV BOLUS (SEPSIS)
250.0000 mL | Freq: Once | INTRAVENOUS | Status: AC
  Administered 2017-07-21: 250 mL via INTRAVENOUS

## 2017-07-21 NOTE — Progress Notes (Signed)
Subjective: She says she still doesn't feel very well. She had ventilation/perfusion lung scan done yesterday with intermediate probability. Her renal function is good enough today that I think we can do a CT angiogram. I think it's necessary to do this to settle the issue of whether she has had a pulmonary embolus so that we don't have her on anticoagulation if she doesn't need it. Otherwise her blood pressure has been soft so she's had her carvedilol held.  Objective: Vital signs in last 24 hours: Temp:  [98 F (36.7 C)-100.5 F (38.1 C)] 99.3 F (37.4 C) (07/29 0745) Pulse Rate:  [82-89] 85 (07/29 0745) Resp:  [16-20] 20 (07/29 0745) BP: (106-112)/(61-66) 112/64 (07/29 0745) SpO2:  [95 %-98 %] 97 % (07/29 0745) Weight change:  Last BM Date: 07/20/17  Intake/Output from previous day: 07/28 0701 - 07/29 0700 In: 2136.3 [P.O.:120; I.V.:1966.3; IV Piggyback:50] Out: 500 [Urine:500]  PHYSICAL EXAM General appearance: alert, cooperative and mild distress Resp: clear to auscultation bilaterally Cardio: regular rate and rhythm, S1, S2 normal, no murmur, click, rub or gallop GI: soft, non-tender; bowel sounds normal; no masses,  no organomegaly Extremities: She has skin lesions on both legs Mucous membranes are still slightly dry  Lab Results:  Results for orders placed or performed during the hospital encounter of 07/19/17 (from the past 48 hour(s))  Urinalysis, Routine w reflex microscopic     Status: Abnormal   Collection Time: 07/19/17 11:44 PM  Result Value Ref Range   Color, Urine AMBER (A) YELLOW    Comment: BIOCHEMICALS MAY BE AFFECTED BY COLOR   APPearance CLOUDY (A) CLEAR   Specific Gravity, Urine 1.028 1.005 - 1.030   pH 5.0 5.0 - 8.0   Glucose, UA NEGATIVE NEGATIVE mg/dL   Hgb urine dipstick NEGATIVE NEGATIVE   Bilirubin Urine NEGATIVE NEGATIVE   Ketones, ur NEGATIVE NEGATIVE mg/dL   Protein, ur 100 (A) NEGATIVE mg/dL   Nitrite NEGATIVE NEGATIVE   Leukocytes, UA  MODERATE (A) NEGATIVE   RBC / HPF 6-30 0 - 5 RBC/hpf   WBC, UA 6-30 0 - 5 WBC/hpf   Bacteria, UA RARE (A) NONE SEEN   Squamous Epithelial / LPF TOO NUMEROUS TO COUNT (A) NONE SEEN   WBC Clumps PRESENT    Mucous PRESENT   CBC with Differential/Platelet     Status: Abnormal   Collection Time: 07/19/17 11:45 PM  Result Value Ref Range   WBC 18.2 (H) 4.0 - 10.5 K/uL   RBC 3.98 3.87 - 5.11 MIL/uL   Hemoglobin 11.8 (L) 12.0 - 15.0 g/dL   HCT 36.0 36.0 - 46.0 %   MCV 90.5 78.0 - 100.0 fL   MCH 29.6 26.0 - 34.0 pg   MCHC 32.8 30.0 - 36.0 g/dL   RDW 14.8 11.5 - 15.5 %   Platelets 192 150 - 400 K/uL   Neutrophils Relative % 83 %   Neutro Abs 15.1 (H) 1.7 - 7.7 K/uL   Lymphocytes Relative 7 %   Lymphs Abs 1.3 0.7 - 4.0 K/uL   Monocytes Relative 10 %   Monocytes Absolute 1.9 (H) 0.1 - 1.0 K/uL   Eosinophils Relative 0 %   Eosinophils Absolute 0.0 0.0 - 0.7 K/uL   Basophils Relative 0 %   Basophils Absolute 0.0 0.0 - 0.1 K/uL  Comprehensive metabolic panel     Status: Abnormal   Collection Time: 07/19/17 11:45 PM  Result Value Ref Range   Sodium 137 135 - 145 mmol/L   Potassium  4.1 3.5 - 5.1 mmol/L   Chloride 100 (L) 101 - 111 mmol/L   CO2 25 22 - 32 mmol/L   Glucose, Bld 146 (H) 65 - 99 mg/dL   BUN 30 (H) 6 - 20 mg/dL   Creatinine, Ser 1.71 (H) 0.44 - 1.00 mg/dL   Calcium 9.7 8.9 - 10.3 mg/dL   Total Protein 8.3 (H) 6.5 - 8.1 g/dL   Albumin 3.6 3.5 - 5.0 g/dL   AST 18 15 - 41 U/L   ALT 14 14 - 54 U/L   Alkaline Phosphatase 69 38 - 126 U/L   Total Bilirubin 2.2 (H) 0.3 - 1.2 mg/dL   GFR calc non Af Amer 33 (L) >60 mL/min   GFR calc Af Amer 38 (L) >60 mL/min    Comment: (NOTE) The eGFR has been calculated using the CKD EPI equation. This calculation has not been validated in all clinical situations. eGFR's persistently <60 mL/min signify possible Chronic Kidney Disease.    Anion gap 12 5 - 15  Troponin I     Status: Abnormal   Collection Time: 07/19/17 11:45 PM  Result  Value Ref Range   Troponin I 0.04 (HH) <0.03 ng/mL    Comment: CRITICAL RESULT CALLED TO, READ BACK BY AND VERIFIED WITH: NORMAN,B. AT 9798 ON 07/28/20118 BY EVA   Brain natriuretic peptide     Status: None   Collection Time: 07/19/17 11:45 PM  Result Value Ref Range   B Natriuretic Peptide 46.0 0.0 - 100.0 pg/mL  Urine Culture     Status: Abnormal   Collection Time: 07/19/17 11:47 PM  Result Value Ref Range   Specimen Description URINE, CLEAN CATCH    Special Requests NONE    Culture MULTIPLE SPECIES PRESENT, SUGGEST RECOLLECTION (A)    Report Status 07/21/2017 FINAL   D-dimer, quantitative (not at [facility])     Status: Abnormal   Collection Time: 07/19/17 11:51 PM  Result Value Ref Range   D-Dimer, Quant 2.40 (H) 0.00 - 0.50 ug/mL-FEU    Comment: (NOTE) At the manufacturer cut-off of 0.50 ug/mL FEU, this assay has been documented to exclude PE with a sensitivity and negative predictive value of 97 to 99%.  At this time, this assay has not been approved by the FDA to exclude DVT/VTE. Results should be correlated with clinical presentation.   Troponin I     Status: None   Collection Time: 07/20/17  7:53 AM  Result Value Ref Range   Troponin I <0.03 <0.03 ng/mL  Troponin I     Status: None   Collection Time: 07/20/17 12:59 PM  Result Value Ref Range   Troponin I <0.03 <0.03 ng/mL  Troponin I     Status: None   Collection Time: 07/20/17  8:13 PM  Result Value Ref Range   Troponin I <0.03 <0.03 ng/mL  Basic metabolic panel     Status: Abnormal   Collection Time: 07/21/17  6:47 AM  Result Value Ref Range   Sodium 139 135 - 145 mmol/L   Potassium 3.9 3.5 - 5.1 mmol/L   Chloride 103 101 - 111 mmol/L   CO2 27 22 - 32 mmol/L   Glucose, Bld 126 (H) 65 - 99 mg/dL   BUN 26 (H) 6 - 20 mg/dL   Creatinine, Ser 1.34 (H) 0.44 - 1.00 mg/dL   Calcium 9.0 8.9 - 10.3 mg/dL   GFR calc non Af Amer 44 (L) >60 mL/min   GFR calc Af Amer 51 (L) >60  mL/min    Comment: (NOTE) The eGFR has been  calculated using the CKD EPI equation. This calculation has not been validated in all clinical situations. eGFR's persistently <60 mL/min signify possible Chronic Kidney Disease.    Anion gap 9 5 - 15  CBC     Status: Abnormal   Collection Time: 07/21/17  6:47 AM  Result Value Ref Range   WBC 13.9 (H) 4.0 - 10.5 K/uL   RBC 3.35 (L) 3.87 - 5.11 MIL/uL   Hemoglobin 10.1 (L) 12.0 - 15.0 g/dL   HCT 30.4 (L) 36.0 - 46.0 %   MCV 90.7 78.0 - 100.0 fL   MCH 30.1 26.0 - 34.0 pg   MCHC 33.2 30.0 - 36.0 g/dL   RDW 15.1 11.5 - 15.5 %   Platelets 202 150 - 400 K/uL    ABGS No results for input(s): PHART, PO2ART, TCO2, HCO3 in the last 72 hours.  Invalid input(s): PCO2 CULTURES Recent Results (from the past 240 hour(s))  Urine Culture     Status: Abnormal   Collection Time: 07/19/17 11:47 PM  Result Value Ref Range Status   Specimen Description URINE, CLEAN CATCH  Final   Special Requests NONE  Final   Culture MULTIPLE SPECIES PRESENT, SUGGEST RECOLLECTION (A)  Final   Report Status 07/21/2017 FINAL  Final   Studies/Results: Dg Chest 2 View  Result Date: 07/20/2017 CLINICAL DATA:  Generalized weakness, onset this morning. Recent placement of a defibrillator. EXAM: CHEST  2 VIEW COMPARISON:  07/06/2017 FINDINGS: Grossly intact transvenous cardiac lead. Mild chronic interstitial coarsening. No airspace consolidation. No effusion. Unchanged hilar, mediastinal and cardiac contours. IMPRESSION: No consolidation or effusion.  Intact transvenous ICD lead. Electronically Signed   By: Andreas Newport M.D.   On: 07/20/2017 00:44   Nm Pulmonary Perf And Vent  Result Date: 07/20/2017 CLINICAL DATA:  recent MI on July 13th. Presented with chest and "stomach pain" last few days. No SOB.^ EXAM: NUCLEAR MEDICINE VENTILATION - PERFUSION LUNG SCAN TECHNIQUE: Ventilation images were obtained in multiple projections using inhaled aerosol Tc-50mDTPA. Perfusion images were obtained in multiple projections  after intravenous injection of Tc-951mAA. RADIOPHARMACEUTICALS:  9.7 mCi Technetium-9938mPA aerosol inhalation and 3.87 mCi Technetium-34m80m IV COMPARISON:  Radiograph from previous day showing no consolidation or effusion FINDINGS: Ventilation: Abdominal and central deposition. No convincing focal ventilation defect. Perfusion: Heterogeneous distribution. Single unmatched moderate defect in the lateral basal segment right lower lobe. IMPRESSION: 1. Intermediate likelihood ratio for pulmonary embolism. Electronically Signed   By: D  HLucrezia Europe.   On: 07/20/2017 10:58   Dg Abd 2 Views  Result Date: 07/20/2017 CLINICAL DATA:  Abdominal distention. Generalize weakness since this morning. History of sarcoidosis, hypertension, CHF. EXAM: ABDOMEN - 2 VIEW COMPARISON:  CT abdomen and pelvis 04/01/2017 FINDINGS: Paucity of gas in the abdomen. No gaseous distention of large or small bowel. Scattered gas and stool in the colon. No free intra-abdominal air. No abnormal air-fluid levels. No radiopaque stones. Large soft tissue density suggested over the abdomen likely corresponds to the large cystic pelvic mass seen on previous CT if this has not been resected in the interval. Degenerative changes in the spine and hips. IMPRESSION: Nonobstructive bowel gas pattern. Soft tissue attenuation over the lower abdomen likely corresponding to known cystic pelvic/abdominal mass lesion. Electronically Signed   By: WillLucienne Capers.   On: 07/20/2017 00:46    Medications:  Prior to Admission:  Prescriptions Prior to Admission  Medication Sig Dispense  Refill Last Dose  . albuterol (PROVENTIL HFA;VENTOLIN HFA) 108 (90 Base) MCG/ACT inhaler Inhale 2 puffs into the lungs every 6 (six) hours as needed for shortness of breath. 18 g 0 unknown  . Amino Acids-Protein Hydrolys (FEEDING SUPPLEMENT, PRO-STAT SUGAR FREE 64,) LIQD Take 60 mLs by mouth 2 (two) times daily between meals. 900 mL 0 07/19/2017 at Unknown time  . aspirin  EC 81 MG tablet Take 81 mg by mouth daily.   07/19/2017 at Unknown time  . carvedilol (COREG) 12.5 MG tablet Take 1 tablet (12.5 mg total) by mouth 2 (two) times daily with a meal. 60 tablet 0 07/19/2017 at 1800  . collagenase (SANTYL) ointment Apply 1 application topically as needed (for wound care). Apply to wound BLE per Tx 15 g 0 07/19/2017 at Unknown time  . furosemide (LASIX) 40 MG tablet Take 1 tablet (40 mg total) by mouth daily. 30 tablet 0 07/19/2017 at Unknown time  . HYDROcodone-acetaminophen (NORCO/VICODIN) 5-325 MG tablet Take 1-2 tablets by mouth every 6 (six) hours as needed for moderate pain. 20 tablet 0 07/19/2017 at Unknown time  . lisinopril (PRINIVIL,ZESTRIL) 5 MG tablet Take 1 tablet (5 mg total) by mouth daily. 30 tablet 0 07/19/2017 at Unknown time  . loratadine (CLARITIN) 10 MG tablet Take 10 mg by mouth daily.   07/19/2017 at Unknown time  . ondansetron (ZOFRAN) 4 MG tablet Take 1 tablet (4 mg total) by mouth every 6 (six) hours as needed for nausea. 20 tablet 0 unknown  . potassium chloride SA (K-DUR,KLOR-CON) 20 MEQ tablet Take 60 mEq by mouth daily.   07/19/2017 at Unknown time   Scheduled: . aspirin EC  81 mg Oral Daily  . carvedilol  12.5 mg Oral BID WC  . enoxaparin (LOVENOX) injection  120 mg Subcutaneous Q12H  . pneumococcal 23 valent vaccine  0.5 mL Intramuscular Tomorrow-1000   Continuous: . sodium chloride 75 mL/hr at 07/21/17 0116  . cefTRIAXone (ROCEPHIN)  IV Stopped (07/21/17 0146)  . sodium chloride 250 mL (07/21/17 0948)   EBR:[id number], albuterol, collagenase, iopamidol, ondansetron **OR** ondansetron (ZOFRAN) IV  Assesment: She was admitted with generalized weakness. She has acute kidney injury which is improving. There is a question as to whether she has a pulmonary embolus. As her renal function has improved. She has skin lesions on her legs and I have written orders for dressings. At baseline she has heart failure which does not appear to be  symptomatic at least now. Active Problems:   Generalized weakness    Plan: Continue treatments. Continue IV fluids. Check CT angiogram today. I'm going to keep her on fluids to try to protect her kidneys from injury. That will need to be reassessed.    LOS: 0 days   Austine Kelsay L 07/21/2017, 10:14 AM

## 2017-07-22 ENCOUNTER — Ambulatory Visit: Payer: Medicare Other

## 2017-07-22 ENCOUNTER — Other Ambulatory Visit: Payer: Self-pay | Admitting: *Deleted

## 2017-07-22 LAB — BASIC METABOLIC PANEL
Anion gap: 8 (ref 5–15)
BUN: 22 mg/dL — ABNORMAL HIGH (ref 6–20)
CO2: 26 mmol/L (ref 22–32)
Calcium: 8.6 mg/dL — ABNORMAL LOW (ref 8.9–10.3)
Chloride: 102 mmol/L (ref 101–111)
Creatinine, Ser: 1.23 mg/dL — ABNORMAL HIGH (ref 0.44–1.00)
GFR calc Af Amer: 56 mL/min — ABNORMAL LOW (ref >60)
GFR calc non Af Amer: 48 mL/min — ABNORMAL LOW (ref >60)
Glucose, Bld: 106 mg/dL — ABNORMAL HIGH (ref 65–99)
Potassium: 3.6 mmol/L (ref 3.5–5.1)
Sodium: 136 mmol/L (ref 135–145)

## 2017-07-22 MED ORDER — LISINOPRIL 5 MG PO TABS
2.5000 mg | ORAL_TABLET | Freq: Every day | ORAL | Status: DC
  Administered 2017-07-22 – 2017-07-24 (×3): 2.5 mg via ORAL
  Filled 2017-07-22 (×3): qty 1

## 2017-07-22 MED ORDER — ENOXAPARIN SODIUM 60 MG/0.6ML ~~LOC~~ SOLN
60.0000 mg | SUBCUTANEOUS | Status: DC
  Administered 2017-07-22 – 2017-07-23 (×2): 60 mg via SUBCUTANEOUS
  Filled 2017-07-22 (×2): qty 0.6

## 2017-07-22 MED ORDER — ENOXAPARIN SODIUM 60 MG/0.6ML ~~LOC~~ SOLN: 60.0000 mg | SUBCUTANEOUS | Status: DC

## 2017-07-22 NOTE — Consult Note (Addendum)
   [facility] CM Inpatient Consult   07/22/2017  Kari [name] 01/17/1961 [id number]    Acknowledged notification from [place] Management office of hospitalization.  Patient was recently contacted by [facility] due to [facility] trigger. Please see chart review then encounters for further patient outreach details.  Attempted to call into patient's room to discuss [place] Management engagements for [facility] RNCM follow up. However, the phone just rang and rang. No answer.  Left voicemail messages for both inpatient Nurse Case Managers to make aware of above.   Will forward above information to [facility] Liaison for follow up tomorrow.   Marthenia Rolling, MSN-Ed, RN,BSN [facility] Liaison [phone]

## 2017-07-22 NOTE — Patient Outreach (Signed)
[facility]) Care Management  07/22/2017  Kari [name] [birth year] [id number]  EMMI-Heart Failure referral; red alert for Day 10 & 11 on 7/26 &7/27 for not weighing;  Per history review patient is currently inpatient states.  [facility] Liaison following.  Plan: Send to care management assistant to close out EMMI referral.  Sherrin Daisy, RN BSN [place] Management Coordinator [facility] Care Management  [phone]

## 2017-07-22 NOTE — Progress Notes (Signed)
[facility]  Patient Status: Active (receiving services up to time of hospitalization)  [facility] is providing the following services: RN and PT  If patient discharges after hours, please call [phone].   Kari [name] 07/22/2017, 4:25 PM

## 2017-07-22 NOTE — Progress Notes (Signed)
Subjective: Patient feels slightly better. She is still weak. Her po intake is improving. Pulmonary embolism is ruled by CT angio. Her BUN and Cr is improving.  Objective: Vital signs in last 24 hours: Temp:  [98.9 F (37.2 C)-99.8 F (37.7 C)] 99.8 F (37.7 C) (07/29 2113) Pulse Rate:  [83-89] 89 (07/29 2113) Resp:  [18] 18 (07/29 2113) BP: (92-104)/(54-63) 104/63 (07/29 2113) SpO2:  [98 %] 98 % (07/29 2113) Weight change:  Last BM Date: 07/20/17  Intake/Output from previous day: 07/29 0701 - 07/30 0700 In: 1810 [I.V.:1760; IV Piggyback:50] Out: 650 [Urine:650]  PHYSICAL EXAM General appearance: alert and moderately obese Resp: diminished breath sounds bilaterally and rhonchi bilaterally Cardio: S1, S2 normal GI: soft, non-tender; bowel sounds normal; no masses,  no organomegaly Extremities: chronic venous stasis, her legs are wrapped  Lab Results:  Results for orders placed or performed during the hospital encounter of 07/19/17 (from the past 48 hour(s))  Troponin I     Status: None   Collection Time: 07/20/17 12:59 PM  Result Value Ref Range   Troponin I <0.03 <0.03 ng/mL  Troponin I     Status: None   Collection Time: 07/20/17  8:13 PM  Result Value Ref Range   Troponin I <0.03 <0.03 ng/mL  Basic metabolic panel     Status: Abnormal   Collection Time: 07/21/17  6:47 AM  Result Value Ref Range   Sodium 139 135 - 145 mmol/L   Potassium 3.9 3.5 - 5.1 mmol/L   Chloride 103 101 - 111 mmol/L   CO2 27 22 - 32 mmol/L   Glucose, Bld 126 (H) 65 - 99 mg/dL   BUN 26 (H) 6 - 20 mg/dL   Creatinine, Ser 1.34 (H) 0.44 - 1.00 mg/dL   Calcium 9.0 8.9 - 10.3 mg/dL   GFR calc non Af Amer 44 (L) >60 mL/min   GFR calc Af Amer 51 (L) >60 mL/min    Comment: (NOTE) The eGFR has been calculated using the CKD EPI equation. This calculation has not been validated in all clinical situations. eGFR's persistently <60 mL/min signify possible Chronic Kidney Disease.    Anion gap 9 5 - 15   CBC     Status: Abnormal   Collection Time: 07/21/17  6:47 AM  Result Value Ref Range   WBC 13.9 (H) 4.0 - 10.5 K/uL   RBC 3.35 (L) 3.87 - 5.11 MIL/uL   Hemoglobin 10.1 (L) 12.0 - 15.0 g/dL   HCT 30.4 (L) 36.0 - 46.0 %   MCV 90.7 78.0 - 100.0 fL   MCH 30.1 26.0 - 34.0 pg   MCHC 33.2 30.0 - 36.0 g/dL   RDW 15.1 11.5 - 15.5 %   Platelets 202 150 - 400 K/uL  Basic metabolic panel     Status: Abnormal   Collection Time: 07/22/17  6:23 AM  Result Value Ref Range   Sodium 136 135 - 145 mmol/L   Potassium 3.6 3.5 - 5.1 mmol/L   Chloride 102 101 - 111 mmol/L   CO2 26 22 - 32 mmol/L   Glucose, Bld 106 (H) 65 - 99 mg/dL   BUN 22 (H) 6 - 20 mg/dL   Creatinine, Ser 1.23 (H) 0.44 - 1.00 mg/dL   Calcium 8.6 (L) 8.9 - 10.3 mg/dL   GFR calc non Af Amer 48 (L) >60 mL/min   GFR calc Af Amer 56 (L) >60 mL/min    Comment: (NOTE) The eGFR has been calculated using the CKD  EPI equation. This calculation has not been validated in all clinical situations. eGFR's persistently <60 mL/min signify possible Chronic Kidney Disease.    Anion gap 8 5 - 15    ABGS No results for input(s): PHART, PO2ART, TCO2, HCO3 in the last 72 hours.  Invalid input(s): PCO2 CULTURES Recent Results (from the past 240 hour(s))  Urine Culture     Status: Abnormal   Collection Time: 07/19/17 11:47 PM  Result Value Ref Range Status   Specimen Description URINE, CLEAN CATCH  Final   Special Requests NONE  Final   Culture MULTIPLE SPECIES PRESENT, SUGGEST RECOLLECTION (A)  Final   Report Status 07/21/2017 FINAL  Final   Studies/Results: Ct Angio Chest Pe W Or Wo Contrast  Result Date: 07/21/2017 CLINICAL DATA:  Elevated D-dimer.  Recent myocardial infarction. EXAM: CT ANGIOGRAPHY CHEST WITH CONTRAST TECHNIQUE: Multidetector CT imaging of the chest was performed using the standard protocol during bolus administration of intravenous contrast. Multiplanar CT image reconstructions and MIPs were obtained to evaluate the  vascular anatomy. CONTRAST:  100 cc of Isovue 370 COMPARISON:  10/08/2013 FINDINGS: Cardiovascular: The heart size appears within normal limits peer aortic atherosclerosis. The main pulmonary artery is prominent measuring 4 cm in diameter suggestive of PA hypertension. No central obstructing pulmonary embolus and no saddle embolus identified. Beyond the level of the main pulmonary artery is there is a diminished exam detail which is multifactorial including: Patient's body habitus. Respiratory motion artifact and diminished pulmonary arterial opacification. Within this limitation no definite evidence for clinically significant acute pulmonary emboli. Mediastinum/Nodes: The trachea appears patent and is midline peer right paratracheal lymph node measures 1 cm, image 31 of series 4. There is a sub- carinal lymph node which measures 1.6 cm, image 38 of series 4. In the right hilar region there is a 1.6 cm lymph node. Lungs/Pleura: No pleural effusion. Subsegmental atelectasis noted within the lung bases. Anteromedial right upper lobe pulmonary nodule measures 1.1 x 0.6 cm (mean diameter 9 mm), image number 41 of series 6. Previously 7 mm. Upper Abdomen: No acute abnormality. Musculoskeletal: There is no aggressive lytic or sclerotic bone lesion. Chronic anterior right rib fracture deformities are identified. Review of the MIP images confirms the above findings. IMPRESSION: 1. Diminished exam detail secondary to patient body habitus, respiratory motion artifact and suboptimal pulmonary arterial opacification. 2. Within these limitations there is no evidence for central obstructing pulmonary embolus or emboli to the level of the lobar pulmonary arteries. 3. Mild mediastinal and hilar adenopathy, findings may be related if patient's sarcoid. 4. Slow-growing, anteromedial right upper lobe pulmonary nodule with a mean diameter of 9 mm. Previously 7 mm. This is indeterminate, a.m. with a short axis of 6 mm this may be too  small to reliably characterize by PET-CT. Suggest followup imaging in 6 months to assess for any temporal change. Electronically Signed   By: Kerby Moors M.D.   On: 07/21/2017 10:45   Nm Pulmonary Perf And Vent  Result Date: 07/20/2017 CLINICAL DATA:  recent MI on July 13th. Presented with chest and "stomach pain" last few days. No SOB.^ EXAM: NUCLEAR MEDICINE VENTILATION - PERFUSION LUNG SCAN TECHNIQUE: Ventilation images were obtained in multiple projections using inhaled aerosol Tc-43mDTPA. Perfusion images were obtained in multiple projections after intravenous injection of Tc-968mAA. RADIOPHARMACEUTICALS:  9.7 mCi Technetium-9940mPA aerosol inhalation and 3.87 mCi Technetium-86m67m IV COMPARISON:  Radiograph from previous day showing no consolidation or effusion FINDINGS: Ventilation: Abdominal and central deposition. No  convincing focal ventilation defect. Perfusion: Heterogeneous distribution. Single unmatched moderate defect in the lateral basal segment right lower lobe. IMPRESSION: 1. Intermediate likelihood ratio for pulmonary embolism. Electronically Signed   By: Lucrezia Europe M.D.   On: 07/20/2017 10:58    Medications: I have reviewed the patient's current medications.  Assesment:  Active Problems:   Generalized weakness   Acute kidney injury ([place]) UTI CHF Chronic stasis of the legs   Plan:  Medication reviewed Continue IV antibiotics Will change Lovenox to prophylaxis dose ( pulmonary embolism is ruled out) Will monitor BMP Continue regular treatment    LOS: 1 day   Amor Packard 07/22/2017, 8:19 AM

## 2017-07-23 DIAGNOSIS — I5042 Chronic combined systolic (congestive) and diastolic (congestive) heart failure: Secondary | ICD-10-CM

## 2017-07-23 LAB — BASIC METABOLIC PANEL
Anion gap: 8 (ref 5–15)
BUN: 20 mg/dL (ref 6–20)
CO2: 25 mmol/L (ref 22–32)
Calcium: 8.7 mg/dL — ABNORMAL LOW (ref 8.9–10.3)
Chloride: 103 mmol/L (ref 101–111)
Creatinine, Ser: 1.05 mg/dL — ABNORMAL HIGH (ref 0.44–1.00)
GFR calc Af Amer: >60 mL/min (ref >60)
GFR calc non Af Amer: 59 mL/min — ABNORMAL LOW (ref >60)
Glucose, Bld: 104 mg/dL — ABNORMAL HIGH (ref 65–99)
Potassium: 3.5 mmol/L (ref 3.5–5.1)
Sodium: 136 mmol/L (ref 135–145)

## 2017-07-23 MED ORDER — DOCUSATE SODIUM 100 MG PO CAPS
100.0000 mg | ORAL_CAPSULE | Freq: Every day | ORAL | Status: DC
  Administered 2017-07-24: 100 mg via ORAL
  Filled 2017-07-23: qty 1

## 2017-07-23 MED ORDER — POTASSIUM CHLORIDE CRYS ER 20 MEQ PO TBCR
20.0000 meq | EXTENDED_RELEASE_TABLET | Freq: Every day | ORAL | Status: DC
  Administered 2017-07-23 – 2017-07-24 (×2): 20 meq via ORAL
  Filled 2017-07-23 (×2): qty 1

## 2017-07-23 NOTE — Progress Notes (Signed)
Subjective: Patient is resting. She is feeling better. She well hydrated. Her B/P remained on the lower range of normal. She is restated on low dose of lisinopril. Cardiology consult was made for recommendation for further management of CHF. Patient was supposed to be seen in out patient cardiology clinic today for follow up of her CHF.  Objective: Vital signs in last 24 hours: Temp:  [98.2 F (36.8 C)-99.2 F (37.3 C)] 98.2 F (36.8 C) (07/31 0551) Pulse Rate:  [75-79] 75 (07/31 0759) Resp:  [18] 18 (07/30 2144) BP: (99-117)/(54-70) 110/54 (07/31 0759) SpO2:  [95 %-99 %] 98 % (07/31 0551) Weight change:  Last BM Date: 07/20/17  Intake/Output from previous day: 07/30 0701 - 07/31 0700 In: 720 [P.O.:720] Out: 600 [Urine:600]  PHYSICAL EXAM General appearance: alert and moderately obese Resp: diminished breath sounds bilaterally and rhonchi bilaterally Cardio: S1, S2 normal GI: soft, non-tender; bowel sounds normal; no masses,  no organomegaly Extremities: chronic venous stasis, her legs are wrapped  Lab Results:  Results for orders placed or performed during the hospital encounter of 07/19/17 (from the past 48 hour(s))  Basic metabolic panel     Status: Abnormal   Collection Time: 07/22/17  6:23 AM  Result Value Ref Range   Sodium 136 135 - 145 mmol/L   Potassium 3.6 3.5 - 5.1 mmol/L   Chloride 102 101 - 111 mmol/L   CO2 26 22 - 32 mmol/L   Glucose, Bld 106 (H) 65 - 99 mg/dL   BUN 22 (H) 6 - 20 mg/dL   Creatinine, Ser 1.23 (H) 0.44 - 1.00 mg/dL   Calcium 8.6 (L) 8.9 - 10.3 mg/dL   GFR calc non Af Amer 48 (L) >60 mL/min   GFR calc Af Amer 56 (L) >60 mL/min    Comment: (NOTE) The eGFR has been calculated using the CKD EPI equation. This calculation has not been validated in all clinical situations. eGFR's persistently <60 mL/min signify possible Chronic Kidney Disease.    Anion gap 8 5 - 15  Basic metabolic panel     Status: Abnormal   Collection Time: 07/23/17  6:27  AM  Result Value Ref Range   Sodium 136 135 - 145 mmol/L   Potassium 3.5 3.5 - 5.1 mmol/L   Chloride 103 101 - 111 mmol/L   CO2 25 22 - 32 mmol/L   Glucose, Bld 104 (H) 65 - 99 mg/dL   BUN 20 6 - 20 mg/dL   Creatinine, Ser 1.05 (H) 0.44 - 1.00 mg/dL   Calcium 8.7 (L) 8.9 - 10.3 mg/dL   GFR calc non Af Amer 59 (L) >60 mL/min   GFR calc Af Amer >60 >60 mL/min    Comment: (NOTE) The eGFR has been calculated using the CKD EPI equation. This calculation has not been validated in all clinical situations. eGFR's persistently <60 mL/min signify possible Chronic Kidney Disease.    Anion gap 8 5 - 15    ABGS No results for input(s): PHART, PO2ART, TCO2, HCO3 in the last 72 hours.  Invalid input(s): PCO2 CULTURES Recent Results (from the past 240 hour(s))  Urine Culture     Status: Abnormal   Collection Time: 07/19/17 11:47 PM  Result Value Ref Range Status   Specimen Description URINE, CLEAN CATCH  Final   Special Requests NONE  Final   Culture MULTIPLE SPECIES PRESENT, SUGGEST RECOLLECTION (A)  Final   Report Status 07/21/2017 FINAL  Final   Studies/Results: Ct Angio Chest Pe W Or Wo  Contrast  Result Date: 07/21/2017 CLINICAL DATA:  Elevated D-dimer.  Recent myocardial infarction. EXAM: CT ANGIOGRAPHY CHEST WITH CONTRAST TECHNIQUE: Multidetector CT imaging of the chest was performed using the standard protocol during bolus administration of intravenous contrast. Multiplanar CT image reconstructions and MIPs were obtained to evaluate the vascular anatomy. CONTRAST:  100 cc of Isovue 370 COMPARISON:  10/08/2013 FINDINGS: Cardiovascular: The heart size appears within normal limits peer aortic atherosclerosis. The main pulmonary artery is prominent measuring 4 cm in diameter suggestive of PA hypertension. No central obstructing pulmonary embolus and no saddle embolus identified. Beyond the level of the main pulmonary artery is there is a diminished exam detail which is multifactorial  including: Patient's body habitus. Respiratory motion artifact and diminished pulmonary arterial opacification. Within this limitation no definite evidence for clinically significant acute pulmonary emboli. Mediastinum/Nodes: The trachea appears patent and is midline peer right paratracheal lymph node measures 1 cm, image 31 of series 4. There is a sub- carinal lymph node which measures 1.6 cm, image 38 of series 4. In the right hilar region there is a 1.6 cm lymph node. Lungs/Pleura: No pleural effusion. Subsegmental atelectasis noted within the lung bases. Anteromedial right upper lobe pulmonary nodule measures 1.1 x 0.6 cm (mean diameter 9 mm), image number 41 of series 6. Previously 7 mm. Upper Abdomen: No acute abnormality. Musculoskeletal: There is no aggressive lytic or sclerotic bone lesion. Chronic anterior right rib fracture deformities are identified. Review of the MIP images confirms the above findings. IMPRESSION: 1. Diminished exam detail secondary to patient body habitus, respiratory motion artifact and suboptimal pulmonary arterial opacification. 2. Within these limitations there is no evidence for central obstructing pulmonary embolus or emboli to the level of the lobar pulmonary arteries. 3. Mild mediastinal and hilar adenopathy, findings may be related if patient's sarcoid. 4. Slow-growing, anteromedial right upper lobe pulmonary nodule with a mean diameter of 9 mm. Previously 7 mm. This is indeterminate, a.m. with a short axis of 6 mm this may be too small to reliably characterize by PET-CT. Suggest followup imaging in 6 months to assess for any temporal change. Electronically Signed   By: Kerby Moors M.D.   On: 07/21/2017 10:45    Medications: I have reviewed the patient's current medications.  Assesment:  Active Problems:   Generalized weakness   Acute kidney injury (HCC) UTI CHF Chronic stasis of the legs   Plan:  Medication reviewed Continue IV antibiotics Lisinopril 2.5  mg po daily Cardiology consult Continue regular treatment.   LOS: 2 days   Clarise Chacko 07/23/2017, 8:35 AM

## 2017-07-23 NOTE — Progress Notes (Signed)
Pharmacy Antibiotic Note  Kari [name] is a 56 y.o. female admitted on 07/19/2017 with UTI.  Pharmacy has been consulted for Ceftriaxone dosing.  Plan: Ceftriaxone 1gm IV q24h Duration of therapy per MD, pharmacy will sign off F/U cxs and clinical progress  Height: 5\' 7"  (170.2 cm) Weight: 272 lb 4.3 oz (123.5 kg) IBW/kg (Calculated) : 61.6  Temp (24hrs), Avg:98.5 F (36.9 C), Min:98.2 F (36.8 C), Max:99.2 F (37.3 C)   Recent Labs Lab 07/19/17 2345 07/21/17 0647 07/22/17 0623 07/23/17 0627  WBC 18.2* 13.9*  --   --   CREATININE 1.71* 1.34* 1.23* 1.05*    Estimated Creatinine Clearance: 82.6 mL/min (A) (by C-G formula based on SCr of 1.05 mg/dL (H)).    No Known Allergies  Antimicrobials this admission: Ceftriaxone 7/28 >>   Microbiology results: 7/28 UCx: pending  Thank you for allowing pharmacy to be a part of this patient's care.  Hart Robinsons, PharmD Clinical Pharmacist Pager:  [phone] 07/23/2017   07/23/2017 10:52 AM

## 2017-07-23 NOTE — Consult Note (Signed)
Cardiology Consultation:   Patient ID: Kari [name]; [id number]; 09-06-1961   Admit date: 07/19/2017 Date of Consult: 07/23/2017  Primary Care Provider: Rosita Fire, MD Primary Cardiologist: Stanford Breed Electrophysiologist: Curt Bears    Patient Profile:   Kari [name] is a 56 y.o. female with a hx of  Medication hypertension, sarcoid, hyperlipidemia, mixed CHF with most recent echocardiogram revealing EF of 40%-45%, nonischemic cardiomyopathy, history of V. fib arrest requiring defibrillation, status post ICD implantation on 07/05/2017 ([place] [id number] (serial  Number [id number]) ICD. who is being seen today for the evaluation of CHF at the request of Dr.Fanta.  History of Present Illness:   Kari [name] Was admitted on 07/20/2017 due to poor M a tight and generalized fatigue and weakness, with associated dizziness. Denied chest pain or dyspnea. On admission the patient was found to have acute kidney injury with creatinine 1.71. Elevated d-dimer of 2.40. She was positive for UTI and started on antibiotic therapy. She was found to be dehydrated.  On arrival to the emergency room blood pressure was 111/65 heart rate 86 O2 sat 98% she had low-grade temp of 99.5. D-dimer was elevated at 2.40. CT scan on 07/12/2017 was negative for PE, she did have a right upper lobe pulmonary nodule which was described as slow growing and was to follow-up in 6 months. EKG revealed sinus rhythm with LVH. Chest x-ray revealed no consolidation or effusion, no CHF.     Since admission, creatinine has improved to 1.05, potassium 3.5 after rehydration with IV fluids. , She is not on diuretic therapy at this time. Continues on lisinopril 2.5 mg, carvedilol 12.5 mg twice a day. Review of home medications has her on Lasix 40 mg daily. This is not been restarted during hospitalization. We are asked for cardiology recommendations for medical management. Family was to be seen in [facility] on  follow-up appointment today but requested a cardiology see her here in the hospital as the patient was unable to go to appointment in [facility] for PPM follow up.   Past Medical History:  Diagnosis Date  . Asthma   . CHF (congestive heart failure) ([place]) 03/12/2014  . Depression   . Headache(784.0)   . Hyperlipidemia   . Hypertension   . Lung nodules   . Renal disorder   . Sarcoidosis   . Ulcer    left posterior calf    Past Surgical History:  Procedure Laterality Date  . cataract surgery Left 07-2013  . ICD IMPLANT N/A 07/05/2017   Procedure: ICD Implant;  Surgeon: Constance Haw, MD;  Location: [place] [facility];  Service: Cardiovascular;  Laterality: N/A;  . LEFT HEART CATH AND CORONARY ANGIOGRAPHY N/A 07/03/2017   Procedure: Left Heart Cath and Coronary Angiography;  Surgeon: Belva Crome, MD;  Location: [place] [facility];  Service: Cardiovascular;  Laterality: N/A;  . NO PAST SURGERIES       Inpatient Medications: Scheduled Meds: . aspirin EC  81 mg Oral Daily  . carvedilol  12.5 mg Oral BID WC  . enoxaparin (LOVENOX) injection  60 mg Subcutaneous Q24H  . lisinopril  2.5 mg Oral Daily   Continuous Infusions: . sodium chloride 10 mL/hr at 07/22/17 1951  . cefTRIAXone (ROCEPHIN)  IV Stopped (07/23/17 0257)   PRN Meds: acetaminophen, albuterol, collagenase, ondansetron **OR** ondansetron (ZOFRAN) IV  Allergies:   No Known Allergies  Social History:   Social History   Social History  . Marital status: Single  Spouse name: N/A  . Number of children: 1  . Years of education: N/A   Occupational History  . Not on file.   Social History Main Topics  . Smoking status: Never Smoker  . Smokeless tobacco: Never Used  . Alcohol use No  . Drug use: No  . Sexual activity: Not Currently   Other Topics Concern  . Not on file   Social History Narrative   Lives alone.    Family History:   The patient's family history includes Cancer in her mother;  Diabetes Mellitus II in her father and mother; Heart disease in her father; Heart failure in her mother; Hypertension in her mother; Stroke in her father.  ROS:  Please see the history of present illness.  ROS  All other ROS reviewed and negative.     Physical Exam/Data:   Vitals:   07/22/17 1646 07/22/17 2036 07/22/17 2144 07/23/17 0551  BP: 117/66  110/70 (!) 99/57  Pulse:   79 78  Resp:   18   Temp:   99.2 F (37.3 C) 98.2 F (36.8 C)  TempSrc:   Oral Oral  SpO2:  95% 97% 98%  Weight:      Height:        Intake/Output Summary (Last 24 hours) at 07/23/17 0751 Last data filed at 07/23/17 0500  Gross per 24 hour  Intake              720 ml  Output              600 ml  Net              120 ml   Filed Weights   07/19/17 2318 07/20/17 0654  Weight: 270 lb (122.5 kg) 272 lb 4.3 oz (123.5 kg)   Body mass index is 42.64 kg/m.  General:  Well nourished, well developed, in no acute distress   HEENT: normal Lymph: no adenopathy Neck: no JVD Endocrine:  No thyromegaly. Neck is obese.  Vascular: No carotid bruits; FA pulses 2+ bilaterally without bruits  Cardiac:  normal S1, S2; 1/6 systolic murmur, RRR.  Lungs:  clear to auscultation bilaterally, no wheezing, rhonchi or rales  Abd: soft, nontender, no hepatomegaly  Ext: no edema Musculoskeletal:  No deformities, BUE and BLE strength normal and equal,. ICD pacemaker wound site is clean and dry, steri strips are intact, No evidence of infection or bleeding. Legs are wrapped bilaterally. Venous stasis skin thickening noted.  Skin: warm and dry  Neuro:  CNs 2-12 intact, no focal abnormalities noted Psych:  Normal affect   EKG:  The EKG was personally reviewed and demonstrates: NSR with LVH.  Telemetry:  Telemetry was personally reviewed and demonstrates:    Relevant CV Studies: Echocardiogram 06/27/2017 Left ventricle: Diffuse hypokinesis worse in the inferior basal   wall The cavity size was moderately dilated. Wall  thickness was   increased in a pattern of moderate LVH. Systolic function was   mildly to moderately reduced. The estimated ejection fraction was   in the range of 40% to 45%. Left ventricular diastolic function   parameters were normal. - Aortic valve: There was mild regurgitation. - Mitral valve: There was moderate regurgitation. - Left atrium: The atrium was moderately dilated. - Atrial septum: No defect or patent foramen ovale was identified.  Cardiac Cath 07/03/2017 Conclusion    Left dominant coronary anatomy.  Normal coronary arteries.  Mild to moderate global left ventricular hypo-kinesis with estimated EF 35-40%. LV  end-diastolic pressure is up and normal. Combined systolic and diastolic heart failure, chronic  Recommendations:   Further management per treating team including CAD including guideline directed heart failure therapy  EP evaluation for possible AICD.    Cardiac MRI 07/04/17 FINDINGS: There was moderate LAE. RA/RV were normal. There was moderate MR. The aortic valve was tri-leaflet and moderately thickened. The aortic root was normal 3.4 cm. There was no ASD/PFO or VSD There was no pericardial effusion. There was moderate LVE. The inferior base and posterior lateral walls were somewhat thinned and hypokinetic. The quantitative EF was 50% (EDV 199 ESV 99 SV 100) Delayed enhancement images with gadolinium showed no hyperenhancement, scar, infiltration or evidence of sarcoid  Laboratory Data:  Chemistry Recent Labs Lab 07/21/17 0647 07/22/17 0623 07/23/17 0627  NA 139 136 136  K 3.9 3.6 3.5  CL 103 102 103  CO2 27 26 25   GLUCOSE 126* 106* 104*  BUN 26* 22* 20  CREATININE 1.34* 1.23* 1.05*  CALCIUM 9.0 8.6* 8.7*  GFRNONAA 44* 48* 59*  GFRAA 51* 56* >60  ANIONGAP 9 8 8      Recent Labs Lab 07/19/17 2345  PROT 8.3*  ALBUMIN 3.6  AST 18  ALT 14  ALKPHOS 69  BILITOT 2.2*   Hematology Recent Labs Lab 07/19/17 2345 07/21/17 0647    WBC 18.2* 13.9*  RBC 3.98 3.35*  HGB 11.8* 10.1*  HCT 36.0 30.4*  MCV 90.5 90.7  MCH 29.6 30.1  MCHC 32.8 33.2  RDW 14.8 15.1  PLT 192 202   Cardiac Enzymes Recent Labs Lab 07/19/17 2345 07/20/17 0753 07/20/17 1259 07/20/17 2013  TROPONINI 0.04* <0.03 <0.03 <0.03   No results for input(s): TROPIPOC in the last 168 hours.  BNP Recent Labs Lab 07/19/17 2345  BNP 46.0    DDimer  Recent Labs Lab 07/19/17 2351  DDIMER 2.40*    Radiology/Studies:  Dg Chest 2 View  Result Date: 07/20/2017 CLINICAL DATA:  Generalized weakness, onset this morning. Recent placement of a defibrillator. EXAM: CHEST  2 VIEW COMPARISON:  07/06/2017 FINDINGS: Grossly intact transvenous cardiac lead. Mild chronic interstitial coarsening. No airspace consolidation. No effusion. Unchanged hilar, mediastinal and cardiac contours. IMPRESSION: No consolidation or effusion.  Intact transvenous ICD lead. Electronically Signed   By: Andreas Newport M.D.   On: 07/20/2017 00:44   Ct Angio Chest Pe W Or Wo Contrast  Result Date: 07/21/2017 CLINICAL DATA:  Elevated D-dimer.  Recent myocardial infarction. EXAM: CT ANGIOGRAPHY CHEST WITH CONTRAST TECHNIQUE: Multidetector CT imaging of the chest was performed using the standard protocol during bolus administration of intravenous contrast. Multiplanar CT image reconstructions and MIPs were obtained to evaluate the vascular anatomy. CONTRAST:  100 cc of Isovue 370 COMPARISON:  10/08/2013 FINDINGS: Cardiovascular: The heart size appears within normal limits peer aortic atherosclerosis. The main pulmonary artery is prominent measuring 4 cm in diameter suggestive of PA hypertension. No central obstructing pulmonary embolus and no saddle embolus identified. Beyond the level of the main pulmonary artery is there is a diminished exam detail which is multifactorial including: Patient's body habitus. Respiratory motion artifact and diminished pulmonary arterial opacification.  Within this limitation no definite evidence for clinically significant acute pulmonary emboli. Mediastinum/Nodes: The trachea appears patent and is midline peer right paratracheal lymph node measures 1 cm, image 31 of series 4. There is a sub- carinal lymph node which measures 1.6 cm, image 38 of series 4. In the right hilar region there is a 1.6 cm lymph node.  Lungs/Pleura: No pleural effusion. Subsegmental atelectasis noted within the lung bases. Anteromedial right upper lobe pulmonary nodule measures 1.1 x 0.6 cm (mean diameter 9 mm), image number 41 of series 6. Previously 7 mm. Upper Abdomen: No acute abnormality. Musculoskeletal: There is no aggressive lytic or sclerotic bone lesion. Chronic anterior right rib fracture deformities are identified. Review of the MIP images confirms the above findings. IMPRESSION: 1. Diminished exam detail secondary to patient body habitus, respiratory motion artifact and suboptimal pulmonary arterial opacification. 2. Within these limitations there is no evidence for central obstructing pulmonary embolus or emboli to the level of the lobar pulmonary arteries. 3. Mild mediastinal and hilar adenopathy, findings may be related if patient's sarcoid. 4. Slow-growing, anteromedial right upper lobe pulmonary nodule with a mean diameter of 9 mm. Previously 7 mm. This is indeterminate, a.m. with a short axis of 6 mm this may be too small to reliably characterize by PET-CT. Suggest followup imaging in 6 months to assess for any temporal change. Electronically Signed   By: Kerby Moors M.D.   On: 07/21/2017 10:45   Nm Pulmonary Perf And Vent  Result Date: 07/20/2017 CLINICAL DATA:  recent MI on July 13th. Presented with chest and "stomach pain" last few days. No SOB.^ EXAM: NUCLEAR MEDICINE VENTILATION - PERFUSION LUNG SCAN TECHNIQUE: Ventilation images were obtained in multiple projections using inhaled aerosol Tc-1m DTPA. Perfusion images were obtained in multiple projections  after intravenous injection of Tc-28m MAA. RADIOPHARMACEUTICALS:  9.7 mCi Technetium-51m DTPA aerosol inhalation and 3.87 mCi Technetium-12m MAA IV COMPARISON:  Radiograph from previous day showing no consolidation or effusion FINDINGS: Ventilation: Abdominal and central deposition. No convincing focal ventilation defect. Perfusion: Heterogeneous distribution. Single unmatched moderate defect in the lateral basal segment right lower lobe. IMPRESSION: 1. Intermediate likelihood ratio for pulmonary embolism. Electronically Signed   By: Lucrezia Europe M.D.   On: 07/20/2017 10:58   Dg Abd 2 Views  Result Date: 07/20/2017 CLINICAL DATA:  Abdominal distention. Generalize weakness since this morning. History of sarcoidosis, hypertension, CHF. EXAM: ABDOMEN - 2 VIEW COMPARISON:  CT abdomen and pelvis 04/01/2017 FINDINGS: Paucity of gas in the abdomen. No gaseous distention of large or small bowel. Scattered gas and stool in the colon. No free intra-abdominal air. No abnormal air-fluid levels. No radiopaque stones. Large soft tissue density suggested over the abdomen likely corresponds to the large cystic pelvic mass seen on previous CT if this has not been resected in the interval. Degenerative changes in the spine and hips. IMPRESSION: Nonobstructive bowel gas pattern. Soft tissue attenuation over the lower abdomen likely corresponding to known cystic pelvic/abdominal mass lesion. Electronically Signed   By: Lucienne Capers M.D.   On: 07/20/2017 00:46    Assessment and Plan:   1. Chronic combined systolic/diastolic CHF: Most recent echocardiogram revealed an EF of 40% to 45%. She had been on Lasix 40 mg daily. Was admitted with dehydration and UTI. Diuretics were discontinued and she was given gentle hydration. Creatinine decreased from 1.71-1.05 this a.m. She is not been restarted on any diuretics at this time. Can consider restarting low-dose Lasix 20 mg daily on discharge. Continue ACE inhibitor. She will continue  follow-up with Dr. Stanford Breed primary cardiologist at the [facility] office.   2. St. Jude ICD in situ: I had evaluated her wound and does not appear to be infected, no erythema or pain. She was due to have a wound check yesterday but was unable to come due to admission. I've spoken with pacemaker  device clinic. ICD was interrogated on admission, 07/20/2017 with report in the paper chart. Device is functioning appropriately.  I rescheduled her follow-up appointment with the device clinic for August 6 11 AM.  3. Acute kidney injury: Patient was dehydrated on admission, was also found to have UTI. This is improved with gentle hydration and withholding of diuretics.  4. Hypokalemia: Potassium will be repleted.  5. Anemia: Stable.   Signed, Kari Sims, NP  07/23/2017 7:51 AM   Attending note Patient seen and discussed with DNP Kari [name], I agree with her documentation above. History of chronic combined systolic/diastolic HF, moderate mitral regurgitation, recent admission with cardiac arrest s/p AICD, admitted with weakness   Admission labs K 4.1, Cr 1.71, BUN 30, 46, D-dimer 2.4,  Trop 0.04 then neg x 3 CXR no acute process CT PE no PE, limited exam.  06/2017 echo: LVEF 32-44%, normal diastolic function reported normal however low tissue e' velocities, mod MR, mod LAE 06/2017 cath: normal coronaries, LVEDP 8. Weight at that time around 270 lbs per charting.  06/2017 cardiac MRI: LVEF 50%, inferior baseal and posterior lateral hypokinesis EKG SR, no acute ischemic changes  Patient with recent admission with cardiac arrest. Cath negative for CAD, cardiac MRI without significant findings. AICD placed for secondary prevention. Echo mild systolic dysfunction 01-02%, LVEF by MRI low normal at 50%. Reported normal diastolic function however low tissue anular e' and borderline elevated e/e' with enlarged LAE suggests likely some diastolic dysfunction. Her weight at time of cath when LVEDP was normal  at 8 was 270 lbs. Admission weight this admit 270 lbs, Cr on admit 1.7 trended down to 1 with IVFs consistent with her likely being dry. Diuretics on hold, when restarted would start lower dose of lasix 20mg  daily. No evidence of acute cardiac condition at this time, I suspect her fatigue was related to possible UTI and dehydration.  ICD site is clean, no evidence of inflammation or swelling. No further cardiology recs at this time. If fatigue becomes an ongoing issue can consider lowering coreg dose.   Carlyle Dolly MD

## 2017-07-23 NOTE — Care Management Note (Signed)
Case Management Note  Patient Details  Name: Kari [name] MRN: [id number] Date of Birth: [birth year]  Subjective/Objective:         Admitted with weakness. Pt is from home, lives with sister and requires some assistance with bathing and dressing. Pt recenlty DC'd from hospital. Active with [facility] for nursing and PT. Pt active with [facility]. Pt has aid 7 days a week 3 hrs each day. Pt has rollator and 3 in 1 pta. She will needs scale at discharge.  No other needs communicated.    Action/Plan: DC home with resumption of [facility] services. CM will follow to DC. [place], Vaughan Basta, made aware of referral.   Expected Discharge Date:      07/25/2017            Expected Discharge Plan:  [name]  In-House Referral:  NA  Discharge planning Services  CM Consult  Post Acute Care Choice:  Home Health, Resumption of Svcs/PTA Provider Choice offered to:  Patient  HH Arranged:  RN, PT [facility] Agency:  [place]  Status of Service:  In process, will continue to follow  Sherald Barge, RN 07/23/2017, 3:07 PM

## 2017-07-23 NOTE — Consult Note (Signed)
   [facility] CM Inpatient Consult   07/23/2017  Kari [name] Sep 15, 1961 [id number]   Received a message from [place] to follow up with patient related to a red flag received during EMMI transitions call. Patient eligible for [place] Management services and post hospital discharge follow up related to a diagnosis of Heart Failure and multiple admissions. Patient was evaluated for community based chronic disease management services with [facility] care Management Program as a benefit of patient's [employer]. Met with the patient and sister/caregiver Kari [name] at the bedside to explain [place] Management services. Patient endorses her primary care provider to be Dr. Rosita Fire. Patient states she could really use a b/p cuff because she is supposed to be monitoring her b/p and does not have the equipment. Patient also stated her sister is her care giver after 5pm but she needs some help during the middle of the day. Sister conveyed she does all the cooking but feels she does not understand how to prepare low sodium meals and read labels to make sure her sister is not receiving too much sodium. Talked with patient and sister about [facility] nurse and Social worker contacting them and they both stated that would be great. Sister stated patient has [facility] packet at home and they did not need another one.  Verbal consent recieved. Patient gave [phone] as the best number to reach her. She also gave verbal permission to call her sister/caregiver Kari [name] at [phone] if she can not be reached. Patient will receive post hospital discharge calls and be evaluated for monthly home visits. [place] Management services does not interfere with or replace any services arranged by the inpatient care management team.  Made inpatient RNCM aware that [facility] will be following for care management. For additional questions please contact:   Kenyen Candy RN, [facility] Liaison   [phone]) Business Mobile [phone]) Toll free office

## 2017-07-24 ENCOUNTER — Other Ambulatory Visit: Payer: Self-pay | Admitting: *Deleted

## 2017-07-24 MED ORDER — LISINOPRIL 2.5 MG PO TABS: 5.0000 mg | ORAL_TABLET | Freq: Every day | ORAL | 3 refills | Status: DC

## 2017-07-24 MED ORDER — FUROSEMIDE 20 MG PO TABS: 20.0000 mg | ORAL_TABLET | Freq: Every day | ORAL | 3 refills | Status: DC

## 2017-07-24 MED ORDER — CEFUROXIME AXETIL 250 MG PO TABS: 250.0000 mg | ORAL_TABLET | Freq: Two times a day (BID) | ORAL | 0 refills | Status: AC

## 2017-07-24 NOTE — Discharge Summary (Signed)
Physician Discharge Summary  Patient ID: Kari [name] MRN: [id number] DOB/AGE: [birth year] 56 y.o. Primary Care Physician:Jalayla Chrismer, MD Admit date: 07/19/2017 Discharge date: 07/24/2017    Discharge Diagnoses:   Active Problems:   Generalized weakness   Acute kidney injury (HCC) UTI Dehydration Combined CHF Chronic Venous stasis Ulcer  Allergies as of 07/24/2017   No Known Allergies     Medication List    TAKE these medications   albuterol 108 (90 Base) MCG/ACT inhaler Commonly known as:  PROVENTIL HFA;VENTOLIN HFA Inhale 2 puffs into the lungs every 6 (six) hours as needed for shortness of breath.   aspirin EC 81 MG tablet Take 81 mg by mouth daily.   carvedilol 12.5 MG tablet Commonly known as:  COREG Take 1 tablet (12.5 mg total) by mouth 2 (two) times daily with a meal.   cefUROXime 250 MG tablet Commonly known as:  CEFTIN Take 1 tablet (250 mg total) by mouth 2 (two) times daily.   collagenase ointment Commonly known as:  SANTYL Apply 1 application topically as needed (for wound care). Apply to wound BLE per Tx   feeding supplement (PRO-STAT SUGAR FREE 64) Liqd Take 60 mLs by mouth 2 (two) times daily between meals.   furosemide 20 MG tablet Commonly known as:  LASIX Take 1 tablet (20 mg total) by mouth daily. What changed:  medication strength  how much to take   HYDROcodone-acetaminophen 5-325 MG tablet Commonly known as:  NORCO/VICODIN Take 1-2 tablets by mouth every 6 (six) hours as needed for moderate pain.   lisinopril 2.5 MG tablet Commonly known as:  PRINIVIL,ZESTRIL Take 2 tablets (5 mg total) by mouth daily. What changed:  medication strength   loratadine 10 MG tablet Commonly known as:  CLARITIN Take 10 mg by mouth daily.   ondansetron 4 MG tablet Commonly known as:  ZOFRAN Take 1 tablet (4 mg total) by mouth every 6 (six) hours as needed for nausea.   potassium chloride SA 20 MEQ tablet Commonly known as:   K-DUR,KLOR-CON Take 60 mEq by mouth daily.       Discharged Condition: improved    Consults: cardiology  Significant Diagnostic Studies: Dg Chest 2 View  Result Date: 07/20/2017 CLINICAL DATA:  Generalized weakness, onset this morning. Recent placement of a defibrillator. EXAM: CHEST  2 VIEW COMPARISON:  07/06/2017 FINDINGS: Grossly intact transvenous cardiac lead. Mild chronic interstitial coarsening. No airspace consolidation. No effusion. Unchanged hilar, mediastinal and cardiac contours. IMPRESSION: No consolidation or effusion.  Intact transvenous ICD lead. Electronically Signed   By: Andreas Newport M.D.   On: 07/20/2017 00:44   Dg Chest 2 View  Result Date: 07/06/2017 CLINICAL DATA:  Status post ICD placement. EXAM: CHEST  2 VIEW COMPARISON:  07/03/2017 FINDINGS: New left anterior chest wall single lead pacemaker has its leads projecting in the right ventricle. No pneumothorax. Lungs are clear. Cardiac silhouette is mildly enlarged. No mediastinal or hilar masses. Skeletal structures are unremarkable. IMPRESSION: 1. Left anterior chest wall pacemaker is well positioned. 2. No pneumothorax.  No acute cardiopulmonary disease. Electronically Signed   By: Lajean Manes M.D.   On: 07/06/2017 08:30   Ct Head Wo Contrast  Result Date: 06/26/2017 CLINICAL DATA:  Cardiac arrest. EXAM: CT HEAD WITHOUT CONTRAST TECHNIQUE: Contiguous axial images were obtained from the base of the skull through the vertex without intravenous contrast. COMPARISON:  08/03/2006. FINDINGS: Brain: There is no evidence for acute hemorrhage, hydrocephalus, mass lesion, or abnormal extra-axial fluid collection. No  definite CT evidence for acute infarction. Vascular: No hyperdense vessel or unexpected calcification. Skull: No evidence for fracture. No worrisome lytic or sclerotic lesion. Sinuses/Orbits: The visualized paranasal sinuses and mastoid air cells are clear. Visualized portions of the globes and intraorbital fat  are unremarkable. Other: None. IMPRESSION: Stable.  No acute intracranial abnormality. Electronically Signed   By: Misty Stanley M.D.   On: 06/26/2017 16:36   Ct Angio Chest Pe W Or Wo Contrast  Result Date: 07/21/2017 CLINICAL DATA:  Elevated D-dimer.  Recent myocardial infarction. EXAM: CT ANGIOGRAPHY CHEST WITH CONTRAST TECHNIQUE: Multidetector CT imaging of the chest was performed using the standard protocol during bolus administration of intravenous contrast. Multiplanar CT image reconstructions and MIPs were obtained to evaluate the vascular anatomy. CONTRAST:  100 cc of Isovue 370 COMPARISON:  10/08/2013 FINDINGS: Cardiovascular: The heart size appears within normal limits peer aortic atherosclerosis. The main pulmonary artery is prominent measuring 4 cm in diameter suggestive of PA hypertension. No central obstructing pulmonary embolus and no saddle embolus identified. Beyond the level of the main pulmonary artery is there is a diminished exam detail which is multifactorial including: Patient's body habitus. Respiratory motion artifact and diminished pulmonary arterial opacification. Within this limitation no definite evidence for clinically significant acute pulmonary emboli. Mediastinum/Nodes: The trachea appears patent and is midline peer right paratracheal lymph node measures 1 cm, image 31 of series 4. There is a sub- carinal lymph node which measures 1.6 cm, image 38 of series 4. In the right hilar region there is a 1.6 cm lymph node. Lungs/Pleura: No pleural effusion. Subsegmental atelectasis noted within the lung bases. Anteromedial right upper lobe pulmonary nodule measures 1.1 x 0.6 cm (mean diameter 9 mm), image number 41 of series 6. Previously 7 mm. Upper Abdomen: No acute abnormality. Musculoskeletal: There is no aggressive lytic or sclerotic bone lesion. Chronic anterior right rib fracture deformities are identified. Review of the MIP images confirms the above findings. IMPRESSION: 1.  Diminished exam detail secondary to patient body habitus, respiratory motion artifact and suboptimal pulmonary arterial opacification. 2. Within these limitations there is no evidence for central obstructing pulmonary embolus or emboli to the level of the lobar pulmonary arteries. 3. Mild mediastinal and hilar adenopathy, findings may be related if patient's sarcoid. 4. Slow-growing, anteromedial right upper lobe pulmonary nodule with a mean diameter of 9 mm. Previously 7 mm. This is indeterminate, a.m. with a short axis of 6 mm this may be too small to reliably characterize by PET-CT. Suggest followup imaging in 6 months to assess for any temporal change. Electronically Signed   By: Kerby Moors M.D.   On: 07/21/2017 10:45   Nm Pulmonary Perf And Vent  Result Date: 07/20/2017 CLINICAL DATA:  recent MI on July 13th. Presented with chest and "stomach pain" last few days. No SOB.^ EXAM: NUCLEAR MEDICINE VENTILATION - PERFUSION LUNG SCAN TECHNIQUE: Ventilation images were obtained in multiple projections using inhaled aerosol Tc-68m DTPA. Perfusion images were obtained in multiple projections after intravenous injection of Tc-57m MAA. RADIOPHARMACEUTICALS:  9.7 mCi Technetium-51m DTPA aerosol inhalation and 3.87 mCi Technetium-13m MAA IV COMPARISON:  Radiograph from previous day showing no consolidation or effusion FINDINGS: Ventilation: Abdominal and central deposition. No convincing focal ventilation defect. Perfusion: Heterogeneous distribution. Single unmatched moderate defect in the lateral basal segment right lower lobe. IMPRESSION: 1. Intermediate likelihood ratio for pulmonary embolism. Electronically Signed   By: Lucrezia Europe M.D.   On: 07/20/2017 10:58   Dg Chest [facility]  Result Date: 07/03/2017 CLINICAL DATA:  Shortness of breath.  Hot and sweating. EXAM: PORTABLE CHEST 1 VIEW COMPARISON:  At 17:18. FINDINGS: Stable central catheter. Cardiac enlargement. Worsening aeration, developing BILATERAL  pulmonary opacities most consistent with pulmonary edema. Low lung volumes. No osseous findings. IMPRESSION: Worsening aeration.  Developing pulmonary edema. Electronically Signed   By: Staci Righter M.D.   On: 07/03/2017 08:47   Dg Chest Port 1 View  Result Date: 07/02/2017 CLINICAL DATA:  Respiratory failure/hypoxia EXAM: PORTABLE CHEST 1 VIEW COMPARISON:  July 01, 2017 FINDINGS: Central catheter tip is in the superior vena cava. Endotracheal tube and nasogastric tube have been removed. No evident pneumothorax. There has been interval clearing of patchy consolidation from the left base. Mild left base atelectasis remains. Mild right base atelectasis medially is also stable. No new opacity. Heart is mildly enlarged with pulmonary vascularity within normal limits. No adenopathy. No bone lesions. IMPRESSION: Central catheter tip in superior vena cava. No pneumothorax. Stable cardiomegaly. Bibasilar atelectasis with interval significant clearing on the left. No new opacity on either side. Electronically Signed   By: Lowella Grip III M.D.   On: 07/02/2017 08:06   Dg Chest Port 1 View  Result Date: 07/01/2017 CLINICAL DATA:  Acute respiratory failure with hypoxia.  Intubated. EXAM: PORTABLE CHEST 1 VIEW COMPARISON:  Chest radiograph from one day prior. FINDINGS: Endotracheal tube tip is 3.3 cm above the carina. Enteric tube enters stomach with the tip not seen on this image. Left internal jugular central venous catheter terminates in the middle third of the superior vena cava. Stable cardiomediastinal silhouette with mild cardiomegaly. No pneumothorax. No pleural effusion. Patchy bibasilar consolidation, left greater than right, stable. No overt pulmonary edema. IMPRESSION: 1. Well-positioned support structures. 2. Stable mild cardiomegaly without overt pulmonary edema. 3. Stable patchy bibasilar lung consolidation, left greater than right. Electronically Signed   By: Ilona Sorrel M.D.   On: 07/01/2017 07:41    Dg Chest Port 1 View  Result Date: 06/30/2017 CLINICAL DATA:  Acute respiratory failure with hypoxia. EXAM: PORTABLE CHEST 1 VIEW COMPARISON:  06/29/2017 and 06/28/2017. FINDINGS: 0515 hours. Patient rotated to the left. Endotracheal tube tip is about 2.6 cm above the base of the carina. Left IJ central line tip is stable, projecting over the location of the mid SVC. Pulmonary vascular congestion again noted with retrocardiac left base collapse/ consolidation. The NG tube passes into the stomach although the distal tip position is not included on the film. Telemetry leads overlie the chest. IMPRESSION: 1. No substantial change in exam. 2. Low volume film with cardiomegaly and vascular congestion. 3. Similar appearance retrocardiac left base collapse/consolidation. Electronically Signed   By: Misty Stanley M.D.   On: 06/30/2017 07:19   Dg Chest Port 1 View  Result Date: 06/29/2017 CLINICAL DATA:  Patient history of ET tube. EXAM: PORTABLE CHEST 1 VIEW COMPARISON:  Chest radiograph 06/28/2017. FINDINGS: ET tube terminates in the mid trachea. Enteric tube courses inferior to the diaphragm. Left IJ approach central venous catheter tip projects over the superior vena cava. Monitoring leads overlie the patient. Pacer pads overlie the patient. Stable cardiomegaly. Low lung volumes. Perihilar interstitial opacities, stable to mildly improved. Basilar heterogeneous opacities. No pleural effusion or pneumothorax. IMPRESSION: 1. Stable support apparatus. 2. Low lung volumes with mild improved interstitial opacities, potentially representing atelectasis and basilar atelectasis. 3. Possible small pleural effusions. Electronically Signed   By: Lovey Newcomer M.D.   On: 06/29/2017 07:22   Dg Chest Port 1  View  Result Date: 06/28/2017 CLINICAL DATA:  Intubation . EXAM: PORTABLE CHEST 1 VIEW COMPARISON:  06/27/2017 .  06/26/2017 . FINDINGS: Endotracheal tube, left IJ line, NG tube in stable position. Cardiomegaly with  bilateral pulmonary infiltrates and or edema. No change from prior exam. Low lung volumes. Small bilateral pleural effusions cannot be excluded. No pneumothorax. IMPRESSION: 1. Lines and tubes in stable position. 2. Cardiomegaly with bilateral pulmonary infiltrates/edema, no interim change from prior exam. Small bilateral pleural effusions cannot be excluded . Electronically Signed   By: Marcello Moores  Register   On: 06/28/2017 07:33   Dg Chest Port 1 View  Result Date: 06/27/2017 CLINICAL DATA:  Status post cardiac arrest. History of acute on chronic CHF, asthma, hypertension, sarcoidosis, nonsmoker. EXAM: PORTABLE CHEST 1 VIEW COMPARISON:  Portable chest x-ray of June 26, 2017 FINDINGS: The lungs are mildly hypoinflated. Confluent alveolar opacities persist inferiorly in the right upper lobe and throughout much of the left lung. The hemidiaphragms are visible. There is no significant pleural effusion. No pneumothorax is observed. The cardiac silhouette is enlarged. The pulmonary vascularity is prominent centrally. The interstitial markings are less conspicuous today. The endotracheal tube tip lies 3.6 cm above the carina. The esophagogastric tube tip in proximal port project below the inferior margin of the image. The left internal jugular venous catheter tip projects over the cavoatrial junction. External pacemaker defibrillator pads are present. IMPRESSION: Further mild improvement in CHF and pulmonary interstitial and alveolar edema. The support tubes are in reasonable position. Electronically Signed   By: David  Martinique M.D.   On: 06/27/2017 07:17   Dg Chest Port 1 View  Result Date: 06/26/2017 CLINICAL DATA:  Central line placement EXAM: PORTABLE CHEST 1 VIEW COMPARISON:  Chest radiograph 06/26/2017 at 8:39 p.m. FINDINGS: Left internal jugular vein approach central venous catheter tip is at the cavoatrial junction. Endotracheal tube tip is at the level of the clavicular heads. Enteric tube courses beyond the  field of view. Cardiomegaly is unchanged. Pulmonary edema is slightly improved but remains moderate. No sizable pleural effusion. IMPRESSION: 1. Left IJ CVC tip at the cavoatrial junction. 2. Cardiomegaly and slightly improved, moderate pulmonary edema. Electronically Signed   By: Ulyses Jarred M.D.   On: 06/26/2017 22:32   Dg Chest Port 1 View  Result Date: 06/26/2017 CLINICAL DATA:  Acute respiratory failure with hypoxia. EXAM: PORTABLE CHEST 1 VIEW COMPARISON:  Radiograph earlier this day at 1512 hour FINDINGS: Endotracheal tube 2.9 cm from the carina. Enteric tube in place. Again seen cardiomegaly. Worsening vascular congestion with development perihilar edema. Possible pleural effusions with blunting of costophrenic angle. No pneumothorax. IMPRESSION: 1. Worsening vascular congestion with development of perihilar pulmonary edema. 2. Stable cardiomegaly. Electronically Signed   By: Jeb Levering M.D.   On: 06/26/2017 21:26   Dg Chest Portable 1 View  Result Date: 06/26/2017 CLINICAL DATA:  Intubation. EXAM: PORTABLE CHEST 1 VIEW COMPARISON:  04/01/2017 FINDINGS: New endotracheal tube with tip at the lower margin of the clavicular heads. An orogastric tube reaches the stomach. Cardiomegaly that is chronic and similar to prior when allowing for differences in technique. Low lung volumes with perihilar atelectasis and vascular congestion. No edema, effusion, or visible pneumothorax. IMPRESSION: 1. New endotracheal and orogastric tubes in good position. 2. Low volume chest with perihilar atelectasis and vascular congestion. 3. Cardiomegaly. Electronically Signed   By: Monte Fantasia M.D.   On: 06/26/2017 15:25   Dg Abd 2 Views  Result Date: 07/20/2017 CLINICAL DATA:  Abdominal  distention. Generalize weakness since this morning. History of sarcoidosis, hypertension, CHF. EXAM: ABDOMEN - 2 VIEW COMPARISON:  CT abdomen and pelvis 04/01/2017 FINDINGS: Paucity of gas in the abdomen. No gaseous distention of  large or small bowel. Scattered gas and stool in the colon. No free intra-abdominal air. No abnormal air-fluid levels. No radiopaque stones. Large soft tissue density suggested over the abdomen likely corresponds to the large cystic pelvic mass seen on previous CT if this has not been resected in the interval. Degenerative changes in the spine and hips. IMPRESSION: Nonobstructive bowel gas pattern. Soft tissue attenuation over the lower abdomen likely corresponding to known cystic pelvic/abdominal mass lesion. Electronically Signed   By: Lucienne Capers M.D.   On: 07/20/2017 00:46   Mr Cardiac Morphology W Wo Contrast  Result Date: 07/05/2017 CLINICAL DATA:  Sarcoid, VF EXAM: CARDIAC MRI TECHNIQUE: The patient was scanned on a 1.5 Tesla GE magnet. A dedicated cardiac coil was used. Functional imaging was done using Fiesta sequences. 2,3, and 4 chamber views were done to assess for RWMA's. Modified Simpson's rule using a short axis stack was used to calculate an ejection fraction on a dedicated work Conservation officer, nature. The patient received 35 cc of Multihance. After 10 minutes inversion recovery sequences were used to assess for infiltration and scar tissue. CONTRAST:  35 cc Multihance FINDINGS: There was moderate LAE. RA/RV were normal. There was moderate MR. The aortic valve was tri-leaflet and moderately thickened. The aortic root was normal 3.4 cm. There was no ASD/PFO or VSD There was no pericardial effusion. There was moderate LVE. The inferior base and posterior lateral walls were somewhat thinned and hypokinetic. The quantitative EF was 50% (EDV 199 ESV 99 SV 100) Delayed enhancement images with gadolinium showed no hyperenhancement, scar, infiltration or evidence of sarcoid IMPRESSION: 1) Moderate LVE with inferior basal, posterior lateral hypokinesis EF 50% 2) LVH septal thickness 14 mm 3) No delayed hyperenhancement on inversion recovery sequences post gadolinium 4) Moderate LAE 5) Moderate  MR 6) Normal RV [place] Electronically Signed   By: Jenkins Rouge M.D.   On: 07/05/2017 07:57    Lab Results: Basic Metabolic Panel:  Recent Labs  07/22/17 0623 07/23/17 0627  NA 136 136  K 3.6 3.5  CL 102 103  CO2 26 25  GLUCOSE 106* 104*  BUN 22* 20  CREATININE 1.23* 1.05*  CALCIUM 8.6* 8.7*   Liver Function Tests: No results for input(s): AST, ALT, ALKPHOS, BILITOT, PROT, ALBUMIN in the last 72 hours.   CBC: No results for input(s): WBC, NEUTROABS, HGB, HCT, MCV, PLT in the last 72 hours.  Recent Results (from the past 240 hour(s))  Urine Culture     Status: Abnormal   Collection Time: 07/19/17 11:47 PM  Result Value Ref Range Status   Specimen Description URINE, CLEAN CATCH  Final   Special Requests NONE  Final   Culture MULTIPLE SPECIES PRESENT, SUGGEST RECOLLECTION (A)  Final   Report Status 07/21/2017 FINAL  Final     Hospital Course:   This is a 56 years old female with history of multiple medical illness was admitted due dizziness, weakness  And syncopal episode due to dehydration and UTI. Patient was rehydrated and her ACE and lasix was held. She also received IV antibiotics. Patient improved. Cardiology consult was done who recommended low dose ACE and lasix. Patient is being discharge home in stable condition.  Discharge Exam: Blood pressure 113/70, pulse 76, temperature 98.9 F (37.2 C),  temperature source Oral, resp. rate 13, height 5\' 7"  (1.702 m), weight 123.5 kg (272 lb 4.3 oz), last menstrual period 11/21/2011, SpO2 99 %.    Disposition:  Home.  Discharge Instructions    AMB Referral to [place] Management    Complete by:  As directed    Reason for consult:  Post hospital discharge follow up with Community care manager and [place] Worker   Diagnoses of:  Heart Failure   Expected date of contact:  1-3 days (reserved for hospital discharges)   Please assign patient for community nurse to engage for transition of care calls and evaluate for  monthly home visits. Please also assign [place] Worker related to patient expressed needs to talk with someone about more assistance in the home especially during lunch.  For questions please contact:   Janci Minor RN, [facility] Liaison [phone])      Follow-up Information    Constance Haw, MD Follow up on 07/29/2017.   Specialty:  Cardiology Why:  Post ICD implantation. Patient in the hospital now. I have had the ICD interogated during this admission, 07/22/2017. Spoke to [place] about follow up appt post discharge.   Appointment time 11 am.  Contact information: [place] [address] [phone]        Rosita Fire, MD Follow up in 2 week(s).   Specialty:  Internal Medicine Contact information: [address] [place] [address] [phone]           Signed: Rosita Fire   07/24/2017, 8:21 AM

## 2017-07-24 NOTE — Care Management Note (Signed)
Case Management Note  Patient Details  Name: Kari [name] MRN: [id number] Date of Birth: 17-Apr-1961   Expected Discharge Date:  07/24/17               Expected Discharge Plan:  [place]  In-House Referral:  NA  Discharge planning Services  CM Consult  Post Acute Care Choice:  Home Health, Resumption of Svcs/PTA Provider Choice offered to:  Patient  HH Arranged:  RN, PT [place] Agency:  [facility]  Status of Service:  Completed, signed off  Additional Comments: DC home today with resumption of [facility] services. Pt aware HH has 48hrs to resume services. [facility] rep, [name], Aware of DC and will obtain pt info from chart. Pt provided scale from dept. She communicates no further needs. Family at bedside for conversation.    Sherald Barge, RN 07/24/2017, 9:28 AM

## 2017-07-24 NOTE — Care Management Important Message (Signed)
Important Message  Patient Details  Name: Kari [name] MRN: [id number] Date of Birth: 06-20-1961   Medicare Important Message Given:  Yes    Sherald Barge, RN 07/24/2017, 9:27 AM

## 2017-07-24 NOTE — Patient Outreach (Addendum)
[facility]) Care Management  07/24/2017  Kari [name] Mar 27, 1961 [id number]   Kari [name] is a 56 y.o. female with a hx of hypertension, sarcoidosis, hyperlipidemia, mixed CHF with most recent echocardiogram revealing EF of 40%-45%, nonischemic cardiomyopathy, history of V. fib arrest requiring defibrillation, status post ICD implantation on 07/05/2017.   Kari [name] was admitted to the hospital on 07/20/2017 with generalized fatigue, weakness, and dizziness. She was found to be dehydrated, have acute kidney injury with creatinine 1.71, elevated d-dimer of 2.40 (CT negative for PE but notable for pulmonary nodule which was previously known and slow growing), low grade fever of 99.5, and UTI. She was started on antibiotic therapy and IV fluids.    Kari [name] has been admitted to the hospital 3 times in the last 6 months and prior to this most recent readmission was enrolled in the [facility] transitions program. She flagged red post hospital discharge and was readmitted to the hospital. [facility] Liaison engaged Kari [name] who is very interested in case management services. [name] provided [phone] as the best number to reach her and gave verbal in person permission to [place] Liaison to call her sister/caregiver Colletta [name] at [phone] if for some reason, [name] cannot be reached.   I reached out to Kari [name] today who was eager to talk with me and excited about having case management services. She expressed that she is in need of a blood pressure monitor so that she can monitor her vital signs at home. She says she has a sister Colletta [name] who helps her during the day but after 5pm she doesn't have help and is in need. [name] and her sister conveyed a desire to learn how to prepare foods that will most benefit Kari [name]'s health and align with her prescribed heart healthy, low sodium  diet.   Plan:   1. I have performed a transition of care assessment with Kari [name] (see attached).  2. I have confirmed that Kari [name] has a scheduled home visit with a home health nurse (she is to come tomorrow, Thursday).  3. I have scheduled a face to face, in home evaluation with Kari [name] for Friday, 07/26/17 @ 9:30am.  4. I will take a blood pressure monitor and educational materials re: low sodium diet to Kari [name] on my 07/26/17 visit.  4. I have discussed Kari [name]'s status and potential needs with LCSW Raynaldo Opitz with [facility] who has been consulted for assistance with community resource coordination and needs.   THN CM Care Plan Problem One     Most Recent Value  Care Plan Problem One  Knowledge Deficits related to self health management of heart disease  Role Documenting the Problem One  Care Management Coordinator  Care Plan for Problem One  Active  THN Long Term Goal   Over the next 60 days, patient and caregiver will verbalize basic understanding of plan of care for self health management of heart disease  THN Long Term Goal Start Date  07/25/17  Interventions for Problem One Long Term Goal  [facility] educational materials prepared,  in home/face to face visit scheduled,  TOC assessment performed  THN CM Short Term Goal #1   Over the next 30 days, patient will attend all scheduled provider appointments  [facility] CM Short Term Goal #1 Start Date  07/25/17  Interventions for Short Term Goal #1  prepared [facility] Care Management  Blue Calendar/Notebook with upcoming patient appointments  THN CM Short Term Goal #2   Over the next 30 days, patient will take all medications as prescribed and will verbalize understanding of purpose for each prescribed medication  [facility]N CM Short Term Goal #2 Start Date  07/25/17  Interventions for Short Term Goal #2  scheduled face to face visit for detailed medication review,  confirmed home health visit plans,  reviewed medications via TOC  assessment  THN CM Short Term Goal #3  Over the next 30 days, patient/caregiver will verbalize understanding of signs and symptoms for which she needs to call provider or 911  [facility] CM Short Term Goal #3 Start Date  07/25/17  Interventions for Short Tern Goal #3  prepared educational materials and notebook with stop light tool for patient education and scheduled face to face/home visit,  reviewed signs and symptoms with patient by phone which require call to 911 or provider    [facility] CM Care Plan Problem Two     Most Recent Value  Care Plan Problem Two  In Home Care/Asssistance needs  Role Documenting the Problem Two  Care Management [place] for Problem Two  Active  THN CM Short Term Goal #1   Over the next 30 days, patient will work with [place] social worker to determine eligibility for in home care and community resources/services  [facility] CM Short Term Goal #1 Start Date  07/25/17  Interventions for Short Term Goal #2   confirmed social work referral receipt,  discussed patient care needs with [facility] CM Social Worker      [name]  [phone]

## 2017-07-25 DIAGNOSIS — L97222 Non-pressure chronic ulcer of left calf with fat layer exposed: Secondary | ICD-10-CM | POA: Diagnosis not present

## 2017-07-25 DIAGNOSIS — D869 Sarcoidosis, unspecified: Secondary | ICD-10-CM | POA: Diagnosis not present

## 2017-07-25 DIAGNOSIS — L97822 Non-pressure chronic ulcer of other part of left lower leg with fat layer exposed: Secondary | ICD-10-CM | POA: Diagnosis not present

## 2017-07-25 DIAGNOSIS — E876 Hypokalemia: Secondary | ICD-10-CM | POA: Diagnosis not present

## 2017-07-25 DIAGNOSIS — J45909 Unspecified asthma, uncomplicated: Secondary | ICD-10-CM | POA: Diagnosis not present

## 2017-07-25 DIAGNOSIS — I872 Venous insufficiency (chronic) (peripheral): Secondary | ICD-10-CM | POA: Diagnosis not present

## 2017-07-25 NOTE — Progress Notes (Signed)
Patient's IV removed.  Site WNL.  Patient refused dressing changes to BLE.  Patient reports that patient's home health nurse will be coming to change her dressing today.  Patient verbalized understanding of discharge instructions, physician follow-up, medications.  Patient transported by NT via wheelchair at discharge.  Patient stable at discharge.

## 2017-07-26 ENCOUNTER — Other Ambulatory Visit: Payer: Self-pay | Admitting: *Deleted

## 2017-07-26 DIAGNOSIS — E876 Hypokalemia: Secondary | ICD-10-CM | POA: Diagnosis not present

## 2017-07-26 DIAGNOSIS — J45909 Unspecified asthma, uncomplicated: Secondary | ICD-10-CM | POA: Diagnosis not present

## 2017-07-26 DIAGNOSIS — D869 Sarcoidosis, unspecified: Secondary | ICD-10-CM | POA: Diagnosis not present

## 2017-07-26 DIAGNOSIS — L97822 Non-pressure chronic ulcer of other part of left lower leg with fat layer exposed: Secondary | ICD-10-CM | POA: Diagnosis not present

## 2017-07-26 DIAGNOSIS — I872 Venous insufficiency (chronic) (peripheral): Secondary | ICD-10-CM | POA: Diagnosis not present

## 2017-07-26 DIAGNOSIS — L97222 Non-pressure chronic ulcer of left calf with fat layer exposed: Secondary | ICD-10-CM | POA: Diagnosis not present
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
## 2017-07-29 ENCOUNTER — Ambulatory Visit (INDEPENDENT_AMBULATORY_CARE_PROVIDER_SITE_OTHER): Payer: Medicare Other | Admitting: *Deleted

## 2017-07-29 DIAGNOSIS — I469 Cardiac arrest, cause unspecified: Secondary | ICD-10-CM | POA: Diagnosis not present

## 2017-07-29 DIAGNOSIS — I428 Other cardiomyopathies: Secondary | ICD-10-CM | POA: Diagnosis not present

## 2017-07-29 LAB — CUP PACEART INCLINIC DEVICE CHECK
Battery Remaining Longevity: 88 mo
Brady Statistic RV Percent Paced: 0.03 %
Date Time Interrogation Session: 20180806113003
HighPow Impedance: 52.875
Implantable Lead Implant Date: 20180713
Implantable Lead Location: 753860
Implantable Pulse Generator Implant Date: 20180713
Lead Channel Impedance Value: 425 Ohm
Lead Channel Pacing Threshold Amplitude: 0.5 V
Lead Channel Pacing Threshold Amplitude: 0.5 V
Lead Channel Pacing Threshold Pulse Width: 0.5 ms
Lead Channel Pacing Threshold Pulse Width: 0.5 ms
Lead Channel Sensing Intrinsic Amplitude: 7.7 mV
Lead Channel Setting Pacing Amplitude: 3.5 V
Lead Channel Setting Pacing Pulse Width: 0.5 ms
Lead Channel Setting Sensing Sensitivity: 0.5 mV
Pulse Gen Serial Number: 7302172

## 2017-07-29 NOTE — Progress Notes (Signed)
Wound check appointment. Steri-strips removed. Wound without redness or edema. Incision edges approximated, wound well healed. Normal device function. Threshold, sensing, and impedances consistent with implant measurements. Device programmed at 3.5V for extra safety margin until 3 month visit. Histogram distribution appropriate for patient and level of activity. No ventricular arrhythmias noted. Patient educated about wound care, arm mobility, lifting restrictions, shock plan and Merlin monitoring. ROV with Dr. Curt Bears 10/14/17.

## 2017-07-30 DIAGNOSIS — L97822 Non-pressure chronic ulcer of other part of left lower leg with fat layer exposed: Secondary | ICD-10-CM | POA: Diagnosis not present

## 2017-07-30 DIAGNOSIS — J45909 Unspecified asthma, uncomplicated: Secondary | ICD-10-CM | POA: Diagnosis not present

## 2017-07-30 DIAGNOSIS — E876 Hypokalemia: Secondary | ICD-10-CM | POA: Diagnosis not present

## 2017-07-30 DIAGNOSIS — I872 Venous insufficiency (chronic) (peripheral): Secondary | ICD-10-CM | POA: Diagnosis not present

## 2017-07-30 DIAGNOSIS — L97222 Non-pressure chronic ulcer of left calf with fat layer exposed: Secondary | ICD-10-CM | POA: Diagnosis not present

## 2017-07-30 DIAGNOSIS — D869 Sarcoidosis, unspecified: Secondary | ICD-10-CM | POA: Diagnosis not present

## 2017-07-31 ENCOUNTER — Other Ambulatory Visit: Payer: Self-pay | Admitting: *Deleted

## 2017-07-31 ENCOUNTER — Ambulatory Visit: Payer: Self-pay | Admitting: *Deleted

## 2017-07-31 NOTE — Patient Outreach (Signed)
Telephone call to patient for transition of care week 2, spoke with pt, HIPAA verified, pt reports she continues to weigh daily with weight today 260 pounds, pt states " I've lost a lot of fluid"  Home health RN, PT, OT continues to work with pt.  Pt states she saw cardiologist Monday and no changes made, pt states " I can't use my arm much where I had defibrillator put in"  Pt reports she is to see Dr. Legrand Rams on 08/07/17.  No new concerns voiced.    THN CM Care Plan Problem One     Most Recent Value  Care Plan Problem One  Knowledge Deficits related to self health management of heart disease  Role Documenting the Problem One  Care Management Coordinator  Care Plan for Problem One  Active  THN Long Term Goal   Over the next 60 days, patient and caregiver will verbalize basic understanding of plan of care for self health management of heart disease  THN Long Term Goal Start Date  07/25/17  Interventions for Problem One Long Term Goal  RN CM reminded pt of low salt, heart healthy diet and food choices to promote better health  THN CM Short Term Goal #1   Over the next 30 days, patient will attend all scheduled provider appointments  [facility] CM Short Term Goal #1 Start Date  07/25/17  Interventions for Short Term Goal #1  RN CM reminded pt of importance of attending all MD appointments  THN CM Short Term Goal #2   Over the next 30 days, patient will take all medications as prescribed and will verbalize understanding of purpose for each prescribed medication  THN CM Short Term Goal #2 Start Date  07/25/17  Interventions for Short Term Goal #2  Pt reports she has all medications and taking as prescribed, home health continues to work with pt  THN CM Short Term Goal #3  Over the next 30 days, patient/caregiver will verbalize understanding of signs and symptoms for which she needs to call provider or [place] Short Term Goal #3 Start Date  07/25/17    [facility] CM Care Plan Problem Two     Most Recent Value   Care Plan Problem Two  In Home Care/Asssistance needs  Role Documenting the Problem Two  Care Management [place] for Problem Two  Active  THN CM Short Term Goal #1   Over the next 30 days, patient will work with [place] social worker to determine eligibility for in home care and community resources/services  [facility] CM Short Term Goal #1 Start Date  07/25/17      PLAN Continue weekly transition of care calls  Jacqlyn Larsen [facility], [place] Coordinator [phone]

## 2017-07-31 NOTE — Patient Outreach (Signed)
[facility]) Care Management  07/31/2017  Kari [name] 11/13/1961 [id number]   CSW made an initial attempt to try and contact patient today to perform phone assessment, as well as assess and assist with social needs and services, without success. A HIPPA compliant message was left for patient on voicemail (ph#: [phone]). CSW is currently awaiting a return call. CSW will make a second outreach attempt within the next week, if CSW does not receive a return call from patient in the meantime.    Raynaldo Opitz, LCSW [facility]  Clinical Social Worker cell #: [phone]

## 2017-08-01 ENCOUNTER — Telehealth: Payer: Self-pay | Admitting: Cardiology

## 2017-08-01 DIAGNOSIS — L03115 Cellulitis of right lower limb: Secondary | ICD-10-CM | POA: Diagnosis not present

## 2017-08-01 DIAGNOSIS — D869 Sarcoidosis, unspecified: Secondary | ICD-10-CM | POA: Diagnosis not present

## 2017-08-01 DIAGNOSIS — L97222 Non-pressure chronic ulcer of left calf with fat layer exposed: Secondary | ICD-10-CM | POA: Diagnosis not present

## 2017-08-01 DIAGNOSIS — I872 Venous insufficiency (chronic) (peripheral): Secondary | ICD-10-CM | POA: Diagnosis not present

## 2017-08-01 DIAGNOSIS — L97822 Non-pressure chronic ulcer of other part of left lower leg with fat layer exposed: Secondary | ICD-10-CM | POA: Diagnosis not present

## 2017-08-01 DIAGNOSIS — J45909 Unspecified asthma, uncomplicated: Secondary | ICD-10-CM | POA: Diagnosis not present

## 2017-08-01 DIAGNOSIS — E876 Hypokalemia: Secondary | ICD-10-CM | POA: Diagnosis not present

## 2017-08-01 NOTE — Telephone Encounter (Signed)
Patient home nurse calling with questions about patient blood pressure and the medicine. Patient blood pressure this morning was 98/60 before taking blood pressure. Please call Kathlee Nations (patient nurse) back at [phone].

## 2017-08-01 NOTE — Telephone Encounter (Signed)
Called and made patient aware. Patient states that she wants to be seen in the [place] office. Made patient aware that I would send a message to the [facility] scheduling department and they would be calling her to schedule an appointment. Patient verbalized understanding and thanked me for the call.

## 2017-08-01 NOTE — Telephone Encounter (Signed)
Kathlee Nations from [facility] calling and states that the patient's BP is 98/60 this morning with HR at 68. She states that she is a little lightheaded, but denies having any other symptoms. She states that the patient has not taken her BP meds yet and wants to know if she should take them. Patient takes carvedilol 12.5 mg BID and lisinopril 5 mg QD. Patient has not been seen by primary cardiology since 2015. Patient recently had an ICD implant on 07/05/17. Kathlee Nations states that the patient does not have a way to check her BP at home, but they have ordered her a monitor. Advised for patient to hold BP meds today and that the message will be routed to Dr. Curt Bears for review and recommendation.

## 2017-08-01 NOTE — Telephone Encounter (Signed)
Continue to hold as BP is low. Needs follow up with general cardiology for low EF.

## 2017-08-02 DIAGNOSIS — I872 Venous insufficiency (chronic) (peripheral): Secondary | ICD-10-CM | POA: Diagnosis not present

## 2017-08-02 DIAGNOSIS — D869 Sarcoidosis, unspecified: Secondary | ICD-10-CM | POA: Diagnosis not present

## 2017-08-02 DIAGNOSIS — J45909 Unspecified asthma, uncomplicated: Secondary | ICD-10-CM | POA: Diagnosis not present

## 2017-08-02 DIAGNOSIS — E876 Hypokalemia: Secondary | ICD-10-CM | POA: Diagnosis not present

## 2017-08-02 DIAGNOSIS — L97222 Non-pressure chronic ulcer of left calf with fat layer exposed: Secondary | ICD-10-CM | POA: Diagnosis not present

## 2017-08-02 DIAGNOSIS — L97822 Non-pressure chronic ulcer of other part of left lower leg with fat layer exposed: Secondary | ICD-10-CM | POA: Diagnosis not present

## 2017-08-05 ENCOUNTER — Other Ambulatory Visit: Payer: Self-pay | Admitting: *Deleted

## 2017-08-05 DIAGNOSIS — L97822 Non-pressure chronic ulcer of other part of left lower leg with fat layer exposed: Secondary | ICD-10-CM | POA: Diagnosis not present

## 2017-08-05 DIAGNOSIS — I872 Venous insufficiency (chronic) (peripheral): Secondary | ICD-10-CM | POA: Diagnosis not present

## 2017-08-05 DIAGNOSIS — L97222 Non-pressure chronic ulcer of left calf with fat layer exposed: Secondary | ICD-10-CM | POA: Diagnosis not present

## 2017-08-05 DIAGNOSIS — E876 Hypokalemia: Secondary | ICD-10-CM | POA: Diagnosis not present

## 2017-08-05 DIAGNOSIS — J45909 Unspecified asthma, uncomplicated: Secondary | ICD-10-CM | POA: Diagnosis not present

## 2017-08-05 DIAGNOSIS — D869 Sarcoidosis, unspecified: Secondary | ICD-10-CM | POA: Diagnosis not present

## 2017-08-05 NOTE — Patient Outreach (Signed)
[facility]) Care Management  08/05/2017  BAMA HANSELMAN 1961-12-20 [id number]   CSW called & spoke with patient to schedule initial home visit for Friday, 8/17 at 11:45am. Full assessment & note to follow.    Raynaldo Opitz, LCSW [facility]  Clinical Social Worker cell #: [phone]

## 2017-08-06 ENCOUNTER — Telehealth: Payer: Self-pay | Admitting: Cardiology

## 2017-08-06 DIAGNOSIS — I872 Venous insufficiency (chronic) (peripheral): Secondary | ICD-10-CM | POA: Diagnosis not present

## 2017-08-06 DIAGNOSIS — D869 Sarcoidosis, unspecified: Secondary | ICD-10-CM | POA: Diagnosis not present

## 2017-08-06 DIAGNOSIS — L97222 Non-pressure chronic ulcer of left calf with fat layer exposed: Secondary | ICD-10-CM | POA: Diagnosis not present

## 2017-08-06 DIAGNOSIS — L97822 Non-pressure chronic ulcer of other part of left lower leg with fat layer exposed: Secondary | ICD-10-CM | POA: Diagnosis not present

## 2017-08-06 DIAGNOSIS — E876 Hypokalemia: Secondary | ICD-10-CM | POA: Diagnosis not present

## 2017-08-06 DIAGNOSIS — J45909 Unspecified asthma, uncomplicated: Secondary | ICD-10-CM | POA: Diagnosis not present

## 2017-08-06 NOTE — Telephone Encounter (Signed)
Ok, will discuss on follow up appointment./

## 2017-08-06 NOTE — Telephone Encounter (Signed)
Please see original message, at bottom, listing reported BP  (96/68)  (Pt is having lightheadedness w/ low BPs)

## 2017-08-06 NOTE — Telephone Encounter (Signed)
New message   Pt c/o BP issue: STAT if pt c/o blurred vision, one-sided weakness or slurred speech  1. What are your last 5 BP readings? 96/68  2. Are you having any other symptoms (ex. Dizziness, headache, blurred vision, passed out)? No   3. What is your BP issue? RN states pt has not had bp meds today. She said it is continuously reoccuring. Please call.

## 2017-08-06 NOTE — Telephone Encounter (Signed)
HHRN informed me that patient has not taken her Carvedilol or Lisinopril today and her BP is low. Advised to continue to hold medications as BP is low (see 8/9 telephone note)  and keep f/u w/ cardiology on Friday in [place] office.  Will forward to Jory Sims for her Juluis Rainier as she is seeing patient Friday.

## 2017-08-06 NOTE — Telephone Encounter (Signed)
Need to know what her BP is please. She has reduced heart function and low BP is preferred. Please find out the BP. Needs to take the coreg to prevent cardiac problems with fast HR.

## 2017-08-07 DIAGNOSIS — N390 Urinary tract infection, site not specified: Secondary | ICD-10-CM | POA: Diagnosis not present

## 2017-08-07 DIAGNOSIS — L97929 Non-pressure chronic ulcer of unspecified part of left lower leg with unspecified severity: Secondary | ICD-10-CM | POA: Diagnosis not present

## 2017-08-07 DIAGNOSIS — I5042 Chronic combined systolic (congestive) and diastolic (congestive) heart failure: Secondary | ICD-10-CM | POA: Diagnosis not present

## 2017-08-07 DIAGNOSIS — E860 Dehydration: Secondary | ICD-10-CM | POA: Diagnosis not present

## 2017-08-08 ENCOUNTER — Other Ambulatory Visit: Payer: Self-pay | Admitting: *Deleted

## 2017-08-08 DIAGNOSIS — J45909 Unspecified asthma, uncomplicated: Secondary | ICD-10-CM | POA: Diagnosis not present

## 2017-08-08 DIAGNOSIS — E876 Hypokalemia: Secondary | ICD-10-CM | POA: Diagnosis not present

## 2017-08-08 DIAGNOSIS — L97222 Non-pressure chronic ulcer of left calf with fat layer exposed: Secondary | ICD-10-CM | POA: Diagnosis not present

## 2017-08-08 DIAGNOSIS — L97822 Non-pressure chronic ulcer of other part of left lower leg with fat layer exposed: Secondary | ICD-10-CM | POA: Diagnosis not present

## 2017-08-08 DIAGNOSIS — D869 Sarcoidosis, unspecified: Secondary | ICD-10-CM | POA: Diagnosis not present

## 2017-08-08 DIAGNOSIS — I872 Venous insufficiency (chronic) (peripheral): Secondary | ICD-10-CM | POA: Diagnosis not present

## 2017-08-08 NOTE — Patient Outreach (Signed)
[facility]) Care Management   08/08/2017  Kari [name] 1961/10/02 [id number]  Kari [name] is an 56 y.o. female with a hx of hypertension, sarcoidosis, hyperlipidemia, mixed CHF with most recent echocardiogram revealing EF of 40%-45%, nonischemic cardiomyopathy, history of V. fib arrest requiring defibrillation, status post ICD implantation on 07/05/2017.   [name] admitted to the hospital on 07/20/2017 with generalized fatigue, weakness, and dizziness. She was found to be dehydrated, have acute kidney injury with creatinine 1.71, elevated d-dimer of 2.40 (CT negative for PE but notable for pulmonary nodule which was previously known and slow growing), low grade fever of 99.5, and UTI. She was started on antibiotic therapy and IV fluids.   Kari [name] has been admitted to the hospital 3 times in the last 6 months and prior to this most recent readmission was enrolled in the [facility] transitions program. She is now enrolled in the transition of care program and will be contacted in person or by phone weekly for a 30 day period.   I am seeing Kari [name] at her home today for our initial home visit.   She says she has a sister Kari [name] who helps her during the day but after 5pm she doesn't have help and is in need. Kari [name] and her sister conveyed a desire to learn how to prepare foods that will most benefit Kari [name]'s health and align with her prescribed heart healthy, low sodium diet.   Subjective:   Objective:  BP (!) 86/62   Pulse 64   Wt 228 lb (103.4 kg)   LMP 11/21/2011   SpO2 94%   BMI 35.71 kg/m   Review of Systems  Constitutional: Positive for weight loss. Negative for chills and fever.  HENT: Negative.   Eyes: Negative.   Respiratory: Negative.  Negative for cough, sputum production, shortness of breath and wheezing.   Cardiovascular: Negative.  Negative for chest pain, palpitations and leg swelling.  Gastrointestinal: Negative.   Negative for abdominal pain, nausea and vomiting.  Genitourinary: Negative.   Musculoskeletal: Negative.  Negative for falls.  Skin:       Dressings in place to bilateral lower extremities for superificial skin lesions; receiving twice daily compression therapy for treatment of lymphedema; AICD surgical pocket unremarkable  Neurological: Positive for dizziness. Negative for tingling, sensory change, focal weakness, loss of consciousness and headaches.       Patient reports intermittent dizziness/lightheadedness with lower blood pressures  Psychiatric/Behavioral: Negative.     Physical Exam  Constitutional: She is oriented to person, place, and time. She appears well-developed and well-nourished. She is active. She does not have a sickly appearance. She does not appear ill.  Cardiovascular: Normal rate and regular rhythm.   Respiratory: Effort normal and breath sounds normal. She has no wheezes. She has no rhonchi. She has no rales.  GI: Soft. Bowel sounds are normal.  Neurological: She is alert and oriented to person, place, and time.  Skin: Skin is warm and dry.     Psychiatric: She has a normal mood and affect. Her speech is normal and behavior is normal. Judgment and thought content normal. Cognition and memory are normal.    Encounter Medications:   Outpatient Encounter Prescriptions as of 08/08/2017  Medication Sig Note  . albuterol (PROVENTIL HFA;VENTOLIN HFA) 108 (90 Base) MCG/ACT inhaler Inhale 2 puffs into the lungs every 6 (six) hours as needed for shortness of breath. 08/08/2017: Reports using less than 1x a week  . aspirin EC 81  MG tablet Take 81 mg by mouth daily.   . carvedilol (COREG) 12.5 MG tablet Take 1 tablet (12.5 mg total) by mouth 2 (two) times daily with a meal.   . collagenase (SANTYL) ointment Apply 1 application topically as needed (for wound care). Apply to wound BLE per Tx   . furosemide (LASIX) 20 MG tablet Take 1 tablet (20 mg total) by mouth daily.   Marland Kitchen  HYDROcodone-acetaminophen (NORCO/VICODIN) 5-325 MG tablet Take 1-2 tablets by mouth every 6 (six) hours as needed for moderate pain. 08/08/2017: Reports taking 1x a week  . lisinopril (PRINIVIL,ZESTRIL) 2.5 MG tablet Take 2 tablets (5 mg total) by mouth daily.   Marland Kitchen loratadine (CLARITIN) 10 MG tablet Take 10 mg by mouth daily. 08/08/2017: Reports taking 1x a week  . nitrofurantoin, macrocrystal-monohydrate, (MACROBID) 100 MG capsule Take 100 mg by mouth 2 (two) times daily. Take 1 capsule by mouth twice daily for 5 days (started 08/08/17)   . ondansetron (ZOFRAN) 4 MG tablet Take 1 tablet (4 mg total) by mouth every 6 (six) hours as needed for nausea. 08/08/2017: Reports haven't taken in a week  . potassium chloride SA (K-DUR,KLOR-CON) 20 MEQ tablet Take 60 mEq by mouth daily.   . [DISCONTINUED] Amino Acids-Protein Hydrolys (FEEDING SUPPLEMENT, PRO-STAT SUGAR FREE 64,) LIQD Take 60 mLs by mouth 2 (two) times daily between meals.    No facility-administered encounter medications on file as of 08/08/2017.     Functional Status:   In your present state of health, do you have any difficulty performing the following activities: 07/26/2017 07/20/2017  Hearing? - N  Vision? - N  Difficulty concentrating or making decisions? - N  Walking or climbing stairs? - Y  Dressing or bathing? - Y  Doing errands, shopping? - Y  Preparing Food and eating ? N -  Using the Toilet? N -  In the past six months, have you accidently leaked urine? N -  Do you have problems with loss of bowel control? N -  Managing your Medications? Y -  Managing your Finances? Y -  Housekeeping or managing your Housekeeping? Y -  Some recent data might be hidden    Fall/Depression Screening:    Fall Risk  08/08/2017  Falls in the past year? No  Risk for fall due to : Impaired balance/gait;Impaired mobility   PHQ 2/9 Scores 07/26/2017  PHQ - 2 Score 1    Assessment:  56 year old female disabled patient living in an accessible apartment  in [place]. Kari [name] has a CAP worker and family members, including her sister, who provide assistance to her as needed. She is currently enjoying the benefits of a HHRN, [place]. At home, she is participating in a lymphedema therapy program and utilizes an automated compression device twice daily for therapy.   Chronic Health Condition (lymphedema) - Ms. Putnam has lymphedema of the bilateral lower extremities. She is using a compression device twice daily. Her home health RN is applying dressings to superficial skin lesions on bilateral lower extremities once weekly. Her brother comes by each evening to change her dressing (as instructed by the home health nurse). Ms. Strawderman reports drastic improvement in her lymphedema symptoms. She does not have signs of peripheral edema or abdominal distention.   Ms. Mcswain has been weighing daily and recording. When I checked her scale, it was not zeroed and was reading 20 pounds with nothing on it. I zeroed her scale for her and have ordered a bariatric  digital scale to be drop shipped to her home. This one will be easier for her to stand on, easier for her to see the reading, and will not require zeroing.   Acute/Chronic Health Condition (hypertension) - when I spoke with Ms. Spurrier by phone, she expressed that she is in need of a blood pressure monitor so that she can monitor her vital signs at home. I brought one to her today and provided instruction on how to use it and gave her a notebook with recording pages so that she can document her blood pressure.   To date, her blood pressure is on the lower side rather than too high. She has recently experienced blood systolic blood pressures in the 80's and lightheadedness. She held her Lisinopril and Carvedilol on at least one occasion but has since been taking it daily as prescribed. She was not light headed today, even upon rising, and her bp was  88/62 when I checked it. She has not had chest pain  or nausea.   Ms. Harlan is interested in learning more about her prescribed heart healthy, low sodium diet. I provided print materials and [employer]. We discussed her prescribed diet.   Ms. Roughton has a follow up appointment scheduled in the cardiology office on Friday.   Post surgical needs - Ms. Pfiester had placement of AICD on 07/05/17. She has had a follow up device check (07/29/17). I reviewed with her today how to use the [facility] remote check system. Her surgical pocket is unremarkable and skin is well healed. She understands that she is not to use the left arm for lifting or strenuous activity.    Home Safety/Fall Risk - Ms. Massaro has had falls in the past but none since discharge from the hospital. She does have impaired gait and balance but ambulated steadily with her walker today. Her TUG score still indicates that she is high risk for fall. She is unable to rise from her chair without use of her arms/hands and walker. Her apartment is clean and not cluttered and no area rugs or other items block her path to walking anywhere in the home. Her bathroom is accessible and she has a shower chair and elevated toilet seat.   Ms. Terada is working with home health PT and OT weekly.   Level of Care Concerns - when we first spoke, Ms. Zanni expressed concerns and needs related to ADL's and IADL's. She currently has a CAP worker who comes for a few hours in the morning. She says the CAP supervisor came earlier today and is applying for more hours of CAP services for Ms. Semrad. As noted previously, Ms. Bohnet's brother visits daily, sister helps her daily, and her adult grandson visits as well.    Plan:   Ms. Huxtable will attend all scheduled appointments:  Friday, 08/09/17 @ 1:30pm [name] @ [facility]  Monday, 10/14/17 @ 10:45 Allegra Lai MD @ [facility] Card[facility] (defib check)  Ms Gainey will check her bp and weight daily and record, calling her  provider(s) for findings outside established parameters.   Ms. Boutwell will call her provider(s) for any new or worsened symptoms.   I will see Ms. Lachman at home in 2 weeks but will reach out to her next week by phone for continued transition of care follow up.   THN CM Care Plan Problem One     Most Recent Value  Care Plan Problem One  Knowledge Deficits related to self health  management of heart disease  Role Documenting the Problem One  Care Management Coordinator  Care Plan for Problem One  Active  THN Long Term Goal   Over the next 60 days, patient and caregiver will verbalize basic understanding of plan of care for self health management of heart disease  THN Long Term Goal Start Date  07/25/17  Interventions for Problem One Long Term Goal  RN CM reminded pt of low salt, heart healthy diet and food choices to promote better health  THN CM Short Term Goal #1   Over the next 30 days, patient will attend all scheduled provider appointments  [facility] CM Short Term Goal #1 Start Date  07/25/17  Interventions for Short Term Goal #1  RN CM reminded pt of importance of attending all MD appointments  THN CM Short Term Goal #2   Over the next 30 days, patient will take all medications as prescribed and will verbalize understanding of purpose for each prescribed medication  THN CM Short Term Goal #2 Start Date  07/25/17  Interventions for Short Term Goal #2  Pt reports she has all medications and taking as prescribed, home health continues to work with pt  THN CM Short Term Goal #3  Over the next 30 days, patient/caregiver will verbalize understanding of signs and symptoms for which she needs to call provider or 911  [facility] CM Short Term Goal #3 Start Date  07/25/17  Interventions for Short Tern Goal #3  signs and symptoms requiring notification to provider or call to 911 reviewed utilizing stop light tool and discussion  THN CM Short Term Goal #4  Over the next 30 days, patient will monitor and record  daily weight and bp, calling provider for findings outside established guidelines  THN CM Short Term Goal #4 Start Date  08/08/17  Interventions for Short Term Goal #4  utilizing teachback method and demonstration, I provided instruction to patient regarding daily weights and bp checks, providing a new digital bp monitor and ordering scales to be drop shipped ot her home    [facility] CM Care Plan Problem Two     Most Recent Value  Care Plan Problem Two  In Home Care/Asssistance needs  Role Documenting the Problem Two  Care Management [place] for Problem Two  Active  THN CM Short Term Goal #1   Over the next 30 days, patient will work with [place] social worker to determine eligibility for in home care and community resources/services  [facility] CM Short Term Goal #1 Start Date  07/25/17     [name]  [phone]

## 2017-08-09 ENCOUNTER — Encounter: Payer: Self-pay | Admitting: *Deleted

## 2017-08-09 ENCOUNTER — Encounter: Payer: Self-pay | Admitting: Adult Health

## 2017-08-09 ENCOUNTER — Ambulatory Visit (INDEPENDENT_AMBULATORY_CARE_PROVIDER_SITE_OTHER): Payer: Medicare Other | Admitting: Adult Health

## 2017-08-09 ENCOUNTER — Other Ambulatory Visit: Payer: Self-pay | Admitting: *Deleted

## 2017-08-09 VITALS — BP 110/64 | HR 64 | Ht 67.0 in | Wt 259.4 lb

## 2017-08-09 DIAGNOSIS — I10 Essential (primary) hypertension: Secondary | ICD-10-CM | POA: Diagnosis not present

## 2017-08-09 DIAGNOSIS — I5042 Chronic combined systolic (congestive) and diastolic (congestive) heart failure: Secondary | ICD-10-CM

## 2017-08-09 DIAGNOSIS — Z95810 Presence of automatic (implantable) cardiac defibrillator: Secondary | ICD-10-CM

## 2017-08-09 DIAGNOSIS — I428 Other cardiomyopathies: Secondary | ICD-10-CM

## 2017-08-09 MED ORDER — FUROSEMIDE 20 MG PO TABS: 20.0000 mg | ORAL_TABLET | Freq: Every day | ORAL | 3 refills | Status: DC

## 2017-08-09 MED ORDER — CARVEDILOL 12.5 MG PO TABS: 12.5000 mg | ORAL_TABLET | Freq: Two times a day (BID) | ORAL | 3 refills | Status: DC

## 2017-08-09 MED ORDER — POTASSIUM CHLORIDE CRYS ER 20 MEQ PO TBCR: 20.0000 meq | EXTENDED_RELEASE_TABLET | Freq: Every day | ORAL | 3 refills | Status: DC

## 2017-08-09 MED ORDER — LISINOPRIL 2.5 MG PO TABS: 2.5000 mg | ORAL_TABLET | Freq: Every day | ORAL | 3 refills | Status: DC

## 2017-08-09 NOTE — Progress Notes (Signed)
Cardiology Office Note   Date:  08/09/2017   ID:  Kari [name], DOB [birth year], MRN [id number]  PCP:  Rosita Fire, MD  Cardiologist:  Harrington Challenger Chief Complaint  Patient presents with  . Congestive Heart Failure  . Hypertension      History of Present Illness: Kari [name] is a 56 y.o. female who presents for ongoing assessment and management of mixed heart failure, history of hypertension, moderate MR, chronic chest pain. She was last seen in the office on 06/03/2014, but has been in the hospital since that time. She underwent implantation of ICD implant by Dr. Curt Bears in the setting of VT arrest. This was post cardiac catheterization which revealed normal.   Cardiac Cath 07/03/2017 Conclusion    Left dominant coronary anatomy.  Normal coronary arteries.  Mild to moderate global left ventricular hypo-kinesis with estimated EF 35-40%. LV end-diastolic pressure is up and normal. Combined systolic and diastolic heart failure, chronic   The patient was sent home with home health nursing follow-up. A phone call was made concerning blood pressure control,  she was hypotensive during home health nurse assessment with a blood pressure of 96/68. She was having complaints of lightheadedness.  Echocardiogram  06/27/2017 Left ventricle: Diffuse hypokinesis worse in the inferior basal   wall The cavity size was moderately dilated. Wall thickness was   increased in a pattern of moderate LVH. Systolic function was   mildly to moderately reduced. The estimated ejection fraction was   in the range of 40% to 45%. Left ventricular diastolic function   parameters were normal. - Aortic valve: There was mild regurgitation. - Mitral valve: There was moderate regurgitation. - Left atrium: The atrium was moderately dilated. - Atrial septum: No defect or patent foramen ovale was identified.   Since last visit, blood pressure was reading low at home when she was being followed by home health  evaluation. Her lisinopril was decreased to 2.5 mg daily from 5 mg daily. She is also asked to be transferred to the [place] office for any future appointments, this does include pacemaker checks and evaluation as well.  Past Medical History:  Diagnosis Date  . Asthma   . CHF (congestive heart failure) ([place]) 03/12/2014  . Depression   . Headache(784.0)   . Hyperlipidemia   . Hypertension   . Lung nodules   . Renal disorder   . Sarcoidosis   . Ulcer    left posterior calf    Past Surgical History:  Procedure Laterality Date  . cataract surgery Left 07-2013  . ICD IMPLANT N/A 07/05/2017   Procedure: ICD Implant;  Surgeon: Constance Haw, MD;  Location: [facility];  Service: Cardiovascular;  Laterality: N/A;  . LEFT HEART CATH AND CORONARY ANGIOGRAPHY N/A 07/03/2017   Procedure: Left Heart Cath and Coronary Angiography;  Surgeon: Belva Crome, MD;  Location: [facility];  Service: Cardiovascular;  Laterality: N/A;  . NO PAST SURGERIES       Current Outpatient Prescriptions  Medication Sig Dispense Refill  . albuterol (PROVENTIL HFA;VENTOLIN HFA) 108 (90 Base) MCG/ACT inhaler Inhale 2 puffs into the lungs every 6 (six) hours as needed for shortness of breath. 18 g 0  . aspirin EC 81 MG tablet Take 81 mg by mouth daily.    . carvedilol (COREG) 12.5 MG tablet Take 1 tablet (12.5 mg total) by mouth 2 (two) times daily with a meal. 60 tablet 0  . collagenase (SANTYL) ointment Apply 1 application topically  as needed (for wound care). Apply to wound BLE per Tx 15 g 0  . furosemide (LASIX) 20 MG tablet Take 1 tablet (20 mg total) by mouth daily. 30 tablet 3  . HYDROcodone-acetaminophen (NORCO/VICODIN) 5-325 MG tablet Take 1-2 tablets by mouth every 6 (six) hours as needed for moderate pain. 20 tablet 0  . lisinopril (PRINIVIL,ZESTRIL) 2.5 MG tablet Take 2 tablets (5 mg total) by mouth daily. 30 tablet 3  . loratadine (CLARITIN) 10 MG tablet Take 10 mg by mouth daily.     . nitrofurantoin, macrocrystal-monohydrate, (MACROBID) 100 MG capsule Take 100 mg by mouth 2 (two) times daily. Take 1 capsule by mouth twice daily for 5 days (started 08/08/17)    . ondansetron (ZOFRAN) 4 MG tablet Take 1 tablet (4 mg total) by mouth every 6 (six) hours as needed for nausea. 20 tablet 0  . potassium chloride SA (K-DUR,KLOR-CON) 20 MEQ tablet Take 60 mEq by mouth daily.     No current facility-administered medications for this visit.     Allergies:   Patient has no known allergies.    Social History:  The patient  reports that she has never smoked. She has never used smokeless tobacco. She reports that she does not drink alcohol or use drugs.   Family History:  The patient's family history includes Cancer in her mother; Diabetes Mellitus II in her father and mother; Heart disease in her father; Heart failure in her mother; Hypertension in her mother; Stroke in her father.    ROS: All other systems are reviewed and negative. Unless otherwise mentioned in H&P    PHYSICAL EXAM: VS:  LMP 11/21/2011  , BMI There is no height or weight on file to calculate BMI. GEN: Well nourished, well developed, in no acute distress  HEENT: normal  Neck: no JVD, carotid bruits, or masses Cardiac: RRR; distant heart sounds no murmurs, rubs, or gallops,no edema  Respiratory:  clear to auscultation bilaterally, normal work of breathing GI: soft, nontender, nondistended, + BS MS: no deformity or atrophy well healed pacemaker site in left upper chest. Skin: warm and dry, no rash Neuro:  Strength and sensation are intact Psych: euthymic mood, full affect  Recent Labs: 06/26/2017: TSH 9.201 07/01/2017: Magnesium 2.4 07/19/2017: ALT 14; B Natriuretic Peptide 46.0 07/21/2017: Hemoglobin 10.1; Platelets 202 07/23/2017: BUN 20; Creatinine, Ser 1.05; Potassium 3.5; Sodium 136    Lipid Panel    Component Value Date/Time   CHOL 135 06/21/2012 0950   TRIG 59 06/21/2012 0950   HDL 54 06/21/2012  0950   CHOLHDL 2.5 06/21/2012 0950   VLDL 12 06/21/2012 0950   LDLCALC 69 06/21/2012 0950      Wt Readings from Last 3 Encounters:  08/08/17 228 lb (103.4 kg)  07/20/17 272 lb 4.3 oz (123.5 kg)  07/06/17 264 lb (119.7 kg)     ASSESSMENT AND PLAN:  1.  Hypertension: Blood pressure is better now with lisinopril decreased to 2.5 mg from 5 mg. She was having hypotensive readings at home when being followed by [employer]. She states she feels better with lower dose as well does not have any dizziness or lightheadedness any longer.  2. Chronic mixed CHF: No evidence of fluid overload. She will continue on Lasix. She is tolerating carvedilol 12.5 mg twice a day and therefore I will not increase the dose at this time due to hypotension. She is to avoid salt and is been weighing daily.  3. ICD in situ: [employer] [place] VR [id number] (serial  Number [id number]) ICD. She wishes to be followed and [name] for ongoing interrogations and future appointments. She lives in [place] and does not wish to have to travel [place] each time.  Current medicines are reviewed at length with the patient today.  Refills are provided for all cardiac meds.  Labs/ tests ordered today include:  Phill Myron. [place] Pugh, ANP, AACC   08/09/2017 1:17 PM    [facility] [address], [place], [place] [address] Phone: [phone]; Fax: [phone]

## 2017-08-09 NOTE — Patient Outreach (Signed)
[facility]) Care Management  [facility] Social Work  08/09/2017  ALEXANDRINA FIORINI 1961-08-06 [id number]   Encounter Medications:  Outpatient Encounter Prescriptions as of 08/09/2017  Medication Sig Note  . albuterol (PROVENTIL HFA;VENTOLIN HFA) 108 (90 Base) MCG/ACT inhaler Inhale 2 puffs into the lungs every 6 (six) hours as needed for shortness of breath. 08/08/2017: Reports using less than 1x a week  . aspirin EC 81 MG tablet Take 81 mg by mouth daily.   . carvedilol (COREG) 12.5 MG tablet Take 1 tablet (12.5 mg total) by mouth 2 (two) times daily with a meal.   . collagenase (SANTYL) ointment Apply 1 application topically as needed (for wound care). Apply to wound BLE per Tx   . furosemide (LASIX) 20 MG tablet Take 1 tablet (20 mg total) by mouth daily.   Marland Kitchen HYDROcodone-acetaminophen (NORCO/VICODIN) 5-325 MG tablet Take 1-2 tablets by mouth every 6 (six) hours as needed for moderate pain. 08/08/2017: Reports taking 1x a week  . lisinopril (PRINIVIL,ZESTRIL) 2.5 MG tablet Take 2 tablets (5 mg total) by mouth daily.   Marland Kitchen loratadine (CLARITIN) 10 MG tablet Take 10 mg by mouth daily. 08/08/2017: Reports taking 1x a week  . nitrofurantoin, macrocrystal-monohydrate, (MACROBID) 100 MG capsule Take 100 mg by mouth 2 (two) times daily. Take 1 capsule by mouth twice daily for 5 days (started 08/08/17)   . ondansetron (ZOFRAN) 4 MG tablet Take 1 tablet (4 mg total) by mouth every 6 (six) hours as needed for nausea. 08/08/2017: Reports haven't taken in a week  . potassium chloride SA (K-DUR,KLOR-CON) 20 MEQ tablet Take 60 mEq by mouth daily.    No facility-administered encounter medications on file as of 08/09/2017.     Functional Status:  In your present state of health, do you have any difficulty performing the following activities: 07/26/2017 07/20/2017  Hearing? - N  Vision? - N  Difficulty concentrating or making decisions? - N  Walking or climbing stairs? - Y  Dressing or bathing? -  Y  Doing errands, shopping? - Y  Preparing Food and eating ? N -  Using the Toilet? N -  In the past six months, have you accidently leaked urine? N -  Do you have problems with loss of bowel control? N -  Managing your Medications? Y -  Managing your Finances? Y -  Housekeeping or managing your Housekeeping? Y -  Some recent data might be hidden    Fall/Depression Screening:  PHQ 2/9 Scores 08/09/2017 07/26/2017  PHQ - 2 Score 1 1    Assessment:   CSW received referral from [facility], Janci Minor for assistance with in-home care especially during lunch. CSW met with patient at her apartment in [facility] where he states she has been living for the past 6 months, prior to moving into these apartments - she resided at [employer] and is grateful to have been able to move into her current apartment. CSW spoke with patient about CAPS program, patient informed CSW that she is on the waiting list - CSW spoke with Waymond Cera at [facility], [place] to confirm that she is #41 on the waiting list for CAPS but that she does have an PCS aide come 7 days a week for 2 hours a day through [facility]. Ms. Cotugno's brother Fritz Pickerel visits daily and changes her dressing (as instructed by the home health nurse), sister - [name] helps her daily and transports her  to her doctors appointments, and her adult grandson visits as well.   Patient states that her daughter is planning to meet with the [employer] Monday to discuss services. CSW will follow-up next week to see if there are other community resources available for patient.   Plan:   [facility] CM Care Plan Problem One     Most Recent Value  Care Plan Problem One  Knowledge Deficits related to increasing care assistance in home  Role Documenting the Problem One  Clinical Social Worker  Care Plan for Problem One  Active  [facility] Long Term Goal   Over the next 60 days patient  will have been linked with community resources & information on increasing assistance in-home  [facility] Long Term Goal Start Date  08/09/17  Interventions for Problem One Long Term Goal  CSW provided information for [facility], [facility] to inquire about CAPS program & wait list.        Raynaldo Opitz, [facility]  Clinical Social Worker cell #: [phone]

## 2017-08-09 NOTE — Patient Instructions (Signed)
Medication Instructions:  Your physician recommends that you continue on your current medications as directed. Please refer to the Current Medication list given to you today.   Labwork: NONE   Testing/Procedures: NONE   Follow-Up: Your physician recommends that you schedule a follow-up appointment in: 3 Months    Any Other Special Instructions Will Be Listed Below (If Applicable).     If you need a refill on your cardiac medications before your next appointment, please call your pharmacy.  Thank you for choosing [facility]!   

## 2017-08-12 DIAGNOSIS — L97822 Non-pressure chronic ulcer of other part of left lower leg with fat layer exposed: Secondary | ICD-10-CM | POA: Diagnosis not present

## 2017-08-12 DIAGNOSIS — L97222 Non-pressure chronic ulcer of left calf with fat layer exposed: Secondary | ICD-10-CM | POA: Diagnosis not present

## 2017-08-12 DIAGNOSIS — I872 Venous insufficiency (chronic) (peripheral): Secondary | ICD-10-CM | POA: Diagnosis not present

## 2017-08-12 DIAGNOSIS — D869 Sarcoidosis, unspecified: Secondary | ICD-10-CM | POA: Diagnosis not present

## 2017-08-12 DIAGNOSIS — E876 Hypokalemia: Secondary | ICD-10-CM | POA: Diagnosis not present

## 2017-08-12 DIAGNOSIS — J45909 Unspecified asthma, uncomplicated: Secondary | ICD-10-CM | POA: Diagnosis not present

## 2017-08-13 ENCOUNTER — Other Ambulatory Visit: Payer: Self-pay | Admitting: *Deleted

## 2017-08-13 NOTE — Patient Outreach (Signed)
[facility]) Care Management  08/13/2017  Kari [name] January 08, 1961 [id number]   CSW attempted to reach patient by phone to follow-up on personal care services, but no answer. CSW left HIPPA compliant voicemail on (ph#: [phone]) and will await call back.    Raynaldo Opitz, LCSW [facility]  Clinical Social Worker cell #: [phone]

## 2017-08-14 ENCOUNTER — Other Ambulatory Visit: Payer: Self-pay | Admitting: *Deleted

## 2017-08-14 DIAGNOSIS — E876 Hypokalemia: Secondary | ICD-10-CM | POA: Diagnosis not present

## 2017-08-14 DIAGNOSIS — D869 Sarcoidosis, unspecified: Secondary | ICD-10-CM | POA: Diagnosis not present

## 2017-08-14 DIAGNOSIS — I872 Venous insufficiency (chronic) (peripheral): Secondary | ICD-10-CM | POA: Diagnosis not present

## 2017-08-14 DIAGNOSIS — J45909 Unspecified asthma, uncomplicated: Secondary | ICD-10-CM | POA: Diagnosis not present

## 2017-08-14 DIAGNOSIS — L97822 Non-pressure chronic ulcer of other part of left lower leg with fat layer exposed: Secondary | ICD-10-CM | POA: Diagnosis not present

## 2017-08-14 DIAGNOSIS — L97222 Non-pressure chronic ulcer of left calf with fat layer exposed: Secondary | ICD-10-CM | POA: Diagnosis not present

## 2017-08-14 NOTE — Patient Outreach (Signed)
[facility]) Care Management  08/14/2017  Kari [name] 04/20/1961 [id number]  Telephone Outreach today to [name] to follow up on her home care regimen and self health monitoring. I was unable to reach her today but am scheduled to see her at home next week.    Plan: I will see [name] at home next week on Tuesday.    [place] Management  [phone]

## 2017-08-15 DIAGNOSIS — I872 Venous insufficiency (chronic) (peripheral): Secondary | ICD-10-CM | POA: Diagnosis not present

## 2017-08-15 DIAGNOSIS — L97222 Non-pressure chronic ulcer of left calf with fat layer exposed: Secondary | ICD-10-CM | POA: Diagnosis not present

## 2017-08-15 DIAGNOSIS — J45909 Unspecified asthma, uncomplicated: Secondary | ICD-10-CM | POA: Diagnosis not present

## 2017-08-15 DIAGNOSIS — E876 Hypokalemia: Secondary | ICD-10-CM | POA: Diagnosis not present

## 2017-08-15 DIAGNOSIS — L97822 Non-pressure chronic ulcer of other part of left lower leg with fat layer exposed: Secondary | ICD-10-CM | POA: Diagnosis not present

## 2017-08-15 DIAGNOSIS — D869 Sarcoidosis, unspecified: Secondary | ICD-10-CM | POA: Diagnosis not present

## 2017-08-16 DIAGNOSIS — L97222 Non-pressure chronic ulcer of left calf with fat layer exposed: Secondary | ICD-10-CM | POA: Diagnosis not present

## 2017-08-16 DIAGNOSIS — D869 Sarcoidosis, unspecified: Secondary | ICD-10-CM | POA: Diagnosis not present

## 2017-08-16 DIAGNOSIS — I872 Venous insufficiency (chronic) (peripheral): Secondary | ICD-10-CM | POA: Diagnosis not present

## 2017-08-16 DIAGNOSIS — L97822 Non-pressure chronic ulcer of other part of left lower leg with fat layer exposed: Secondary | ICD-10-CM | POA: Diagnosis not present

## 2017-08-16 DIAGNOSIS — E876 Hypokalemia: Secondary | ICD-10-CM | POA: Diagnosis not present

## 2017-08-16 DIAGNOSIS — J45909 Unspecified asthma, uncomplicated: Secondary | ICD-10-CM | POA: Diagnosis not present

## 2017-08-19 DIAGNOSIS — L97822 Non-pressure chronic ulcer of other part of left lower leg with fat layer exposed: Secondary | ICD-10-CM | POA: Diagnosis not present

## 2017-08-19 DIAGNOSIS — D869 Sarcoidosis, unspecified: Secondary | ICD-10-CM | POA: Diagnosis not present

## 2017-08-19 DIAGNOSIS — E876 Hypokalemia: Secondary | ICD-10-CM | POA: Diagnosis not present

## 2017-08-19 DIAGNOSIS — J45909 Unspecified asthma, uncomplicated: Secondary | ICD-10-CM | POA: Diagnosis not present

## 2017-08-19 DIAGNOSIS — I872 Venous insufficiency (chronic) (peripheral): Secondary | ICD-10-CM | POA: Diagnosis not present

## 2017-08-19 DIAGNOSIS — L97222 Non-pressure chronic ulcer of left calf with fat layer exposed: Secondary | ICD-10-CM | POA: Diagnosis not present

## 2017-08-20 ENCOUNTER — Other Ambulatory Visit: Payer: Self-pay | Admitting: *Deleted

## 2017-08-20 DIAGNOSIS — L97222 Non-pressure chronic ulcer of left calf with fat layer exposed: Secondary | ICD-10-CM | POA: Diagnosis not present

## 2017-08-20 DIAGNOSIS — I872 Venous insufficiency (chronic) (peripheral): Secondary | ICD-10-CM | POA: Diagnosis not present

## 2017-08-20 DIAGNOSIS — L97822 Non-pressure chronic ulcer of other part of left lower leg with fat layer exposed: Secondary | ICD-10-CM | POA: Diagnosis not present

## 2017-08-20 DIAGNOSIS — E876 Hypokalemia: Secondary | ICD-10-CM | POA: Diagnosis not present

## 2017-08-20 DIAGNOSIS — J45909 Unspecified asthma, uncomplicated: Secondary | ICD-10-CM | POA: Diagnosis not present

## 2017-08-20 DIAGNOSIS — D869 Sarcoidosis, unspecified: Secondary | ICD-10-CM | POA: Diagnosis not present

## 2017-08-20 NOTE — Patient Outreach (Signed)
[facility]) Care Management   08/20/2017  STACEYANN KNOUFF [birth year] [id number]  Elienai Gailey Heeg is an 56 y.o. female with a hx of hypertension, sarcoidosis, hyperlipidemia, mixed CHF with most recent echocardiogram revealing EF of 40%-45%, nonischemic cardiomyopathy, history of V. fib arrest requiring defibrillation, status post ICD implantation on 07/05/2017.   [name] admitted to the hospitalon 07/20/2017 with generalized fatigue,weakness, and dizziness. She was found to be dehydrated,have acute kidney injury with creatinine 1.71, elevated d-dimer of 2.40 (CT negative for PE but notable for pulmonary nodule which was previously known and slow growing), low grade fever of 99.5, and UTI. She wasstarted on antibiotic therapy and IV fluids.   Ms. Garciaperez has been admitted to the hospital 3 times in the last 6 months and prior to this most recent readmission was enrolled in the [facility] transitions program. She is now enrolled in the transition of care program and will be contacted in person or by phone weekly for a 30 day period.   Subjective: "I think I'm doing better."  Objective:  BP 102/68   Pulse 62   Wt 253 lb 4 oz (114.9 kg)   LMP 11/21/2011   SpO2 98%   BMI 39.66 kg/m   Review of Systems  Constitutional: Negative.  Negative for weight loss.  HENT: Negative.   Eyes: Negative.   Respiratory: Negative.  Negative for sputum production, shortness of breath and wheezing.   Cardiovascular: Negative.  Negative for chest pain, palpitations and leg swelling.  Gastrointestinal: Negative.   Genitourinary: Negative.   Musculoskeletal: Negative.  Negative for falls.  Skin:       Bilateral lower extremity dressings in place  Neurological: Negative.   Psychiatric/Behavioral: Negative.     Physical Exam  Constitutional: She is oriented to person, place, and time. Vital signs are normal. She appears well-developed and well-nourished. She is active. She  does not have a sickly appearance. She does not appear ill.  Cardiovascular: Normal rate and regular rhythm.   Respiratory: Effort normal and breath sounds normal. She has no wheezes. She has no rhonchi. She has no rales.  GI: Soft. Bowel sounds are normal.  Neurological: She is alert and oriented to person, place, and time.  Skin: Skin is warm, dry and intact.  Psychiatric: She has a normal mood and affect. Her speech is normal and behavior is normal. Judgment and thought content normal. Cognition and memory are normal.    Encounter Medications:   Outpatient Encounter Prescriptions as of 08/20/2017  Medication Sig Note  . albuterol (PROVENTIL HFA;VENTOLIN HFA) 108 (90 Base) MCG/ACT inhaler Inhale 2 puffs into the lungs every 6 (six) hours as needed for shortness of breath. 08/08/2017: Reports using less than 1x a week  . aspirin EC 81 MG tablet Take 81 mg by mouth daily.   . carvedilol (COREG) 12.5 MG tablet Take 1 tablet (12.5 mg total) by mouth 2 (two) times daily with a meal.   . collagenase (SANTYL) ointment Apply 1 application topically as needed (for wound care). Apply to wound BLE per Tx   . furosemide (LASIX) 20 MG tablet Take 1 tablet (20 mg total) by mouth daily.   Marland Kitchen HYDROcodone-acetaminophen (NORCO/VICODIN) 5-325 MG tablet Take 1-2 tablets by mouth every 6 (six) hours as needed for moderate pain. 08/08/2017: Reports taking 1x a week  . lisinopril (PRINIVIL,ZESTRIL) 2.5 MG tablet Take 1 tablet (2.5 mg total) by mouth daily.   Marland Kitchen loratadine (CLARITIN) 10 MG tablet Take 10 mg by mouth  daily. 08/08/2017: Reports taking 1x a week  . nitrofurantoin, macrocrystal-monohydrate, (MACROBID) 100 MG capsule Take 100 mg by mouth 2 (two) times daily. Take 1 capsule by mouth twice daily for 5 days (started 08/08/17)   . ondansetron (ZOFRAN) 4 MG tablet Take 1 tablet (4 mg total) by mouth every 6 (six) hours as needed for nausea. 08/08/2017: Reports haven't taken in a week  . potassium chloride SA  (K-DUR,KLOR-CON) 20 MEQ tablet Take 1 tablet (20 mEq total) by mouth daily.    Assessment:  56 year old female disabled patient living in an accessible apartment in [place]. Ms. Abramo has a CAP worker and family members, including her sister, who provide assistance to her as needed. She is currently enjoying the benefits of a HHRN, [place]. At home, she is participating in a lymphedema therapy program and utilizes an automated compression device twice daily for therapy.   Chronic Health Condition (lymphedema) - Ms. Shiflet has lymphedema of the bilateral lower extremities. She is using a compression device twice daily. Her home health RN is applying dressings to superficial skin lesions on bilateral lower extremities once weekly. Her brother comes by each evening to change her dressing (as instructed by the home health nurse). Ms. Cimini reports drastic improvement in her lymphedema symptoms. She does not have signs of peripheral edema or abdominal distention.   Ms. Schmader has been weighing on an old scale and recording. Her recorded weight on her old scale today was 227lb. I helped her set up her new digital scale today and the weight difference is nearly 30 pounds. On a brand new digital scale, her weight today was 252lb. She does not have peripheral edema, abdominal swelling, abnormal breath sounds, shortness of breath, chest discomfort, low oxygen saturation, or any other new or worsened symptom.  Acute/Chronic Health Condition (hypertension) - I brought a new bp monitor to Mrs. Cieslewicz and she has been checking her bp and recording faithfully each day. Her bp's have been consistently in the 100's-110's/70's. Mrs. Lazenby is taking her medications, including recent changes by cardiology, as prescribed.    Ms. Marek and I spent a great deal of time 2 weeks ago on her prescribed low sodium diet. However, today when we reviewed her cooking practices, it was noted that she is using  "soul food seasoning" which is densely salty. WE discussed food preparation practices that are consistent with her prescribed low sodium diet.   Home Safety/Fall Risk - Ms. Carra has had falls in the past but none since discharge from the hospital. She does have impaired gait and balance but ambulated steadily WITHOUT her walker today. Her TUG score still indicates that she is high risk for fall. She is unable to rise from her chair without use of her arms/hands and walker. Her apartment is clean and not cluttered and no area rugs or other items block her path to walking anywhere in the home. Her bathroom is accessible and she has a shower chair and elevated toilet seat.   Ms. Truluck is working with home health PT and OT weekly.   Plan:   Ms. Cirigliano will attend all scheduled appointments:  10/14/17 @ 10:45 Will Camnitz MD @ [facility] (defib check)  Ms Kewley will check her bp and weight daily and record, calling her provider(s) for findings outside established parameters.   Ms. Overbey will call her provider(s) for any new or worsened symptoms.   I will see Ms. Mcclenny at home in 2 weeks  but will reach out to her next week by phone for continued transition of care follow up.   [facility] CM Care Plan Problem One     Most Recent Value  Care Plan Problem One  Knowledge Deficits related to self health management of heart disease  Role Documenting the Problem One  Care Management Coordinator  Care Plan for Problem One  Active  [facility] Long Term Goal   Over the next 60 days, patient and caregiver will verbalize basic understanding of plan of care for self health management of heart disease  [facility] Long Term Goal Start Date  07/25/17  Interventions for Problem One Long Term Goal  RN CM reminded pt of low salt, heart healthy diet and food choices to promote better health  [facility] CM Short Term Goal #1   Over the next 30 days, patient will attend all scheduled provider appointments  [facility] CM  Short Term Goal #1 Start Date  07/25/17  Interventions for Short Term Goal #1  RN CM reminded pt of importance of attending all MD appointments  [facility] CM Short Term Goal #2   Over the next 30 days, patient will take all medications as prescribed and will verbalize understanding of purpose for each prescribed medication  [facility] CM Short Term Goal #2 Start Date  07/25/17  Interventions for Short Term Goal #2  Pt reports she has all medications and taking as prescribed, home health continues to work with pt  [facility] CM Short Term Goal #3  Over the next 30 days, patient/caregiver will verbalize understanding of signs and symptoms for which she needs to call provider or [place] Short Term Goal #3 Start Date  07/25/17  Interventions for Short Tern Goal #3  signs and symptoms requiring notification to provider or call to 911 reviewed utilizing stop light tool and discussion  [facility] CM Short Term Goal #4  Over the next 30 days, patient will monitor and record daily weight and bp, calling provider for findings outside established guidelines  [facility] CM Short Term Goal #4 Start Date  08/08/17  Interventions for Short Term Goal #4  utilizing teachback method and demonstration, I provided instruction to patient regarding daily weights and bp checks, providing a new digital bp monitor and ordering scales to be drop shipped ot her home  [place] CM Short Term Goal #5   Over the next 30 days, patient will make recommended adjustements to prescribed low sodium diet as reviewed   [facility] CM Short Term Goal #5 Start Date  08/20/17  Interventions for Short Term Goal #5  Utilizing teachbackmethod, reviewed with patient appropriate food preparation practices consistent with her prescribed low sodium diet    [facility] CM Care Plan Problem Two     Most Recent Value  Care Plan Problem Two  In Home Care/Asssistance needs  Role Documenting the Problem Two  Care Management [place] for Problem Two  Active  [facility] CM Short Term Goal #1    Over the next 30 days, patient will work with [place] social worker to determine eligibility for in home care and community resources/services  [facility] CM Short Term Goal #1 Start Date  07/25/17      [name] [place] Care Management  [phone]

## 2017-08-21 DIAGNOSIS — I872 Venous insufficiency (chronic) (peripheral): Secondary | ICD-10-CM | POA: Diagnosis not present

## 2017-08-21 DIAGNOSIS — L97222 Non-pressure chronic ulcer of left calf with fat layer exposed: Secondary | ICD-10-CM | POA: Diagnosis not present

## 2017-08-21 DIAGNOSIS — J45909 Unspecified asthma, uncomplicated: Secondary | ICD-10-CM | POA: Diagnosis not present

## 2017-08-21 DIAGNOSIS — E876 Hypokalemia: Secondary | ICD-10-CM | POA: Diagnosis not present

## 2017-08-21 DIAGNOSIS — L97822 Non-pressure chronic ulcer of other part of left lower leg with fat layer exposed: Secondary | ICD-10-CM | POA: Diagnosis not present

## 2017-08-21 DIAGNOSIS — D869 Sarcoidosis, unspecified: Secondary | ICD-10-CM | POA: Diagnosis not present

## 2017-08-22 DIAGNOSIS — J45909 Unspecified asthma, uncomplicated: Secondary | ICD-10-CM | POA: Diagnosis not present

## 2017-08-22 DIAGNOSIS — L97222 Non-pressure chronic ulcer of left calf with fat layer exposed: Secondary | ICD-10-CM | POA: Diagnosis not present

## 2017-08-22 DIAGNOSIS — L97822 Non-pressure chronic ulcer of other part of left lower leg with fat layer exposed: Secondary | ICD-10-CM | POA: Diagnosis not present

## 2017-08-22 DIAGNOSIS — E876 Hypokalemia: Secondary | ICD-10-CM | POA: Diagnosis not present

## 2017-08-22 DIAGNOSIS — I872 Venous insufficiency (chronic) (peripheral): Secondary | ICD-10-CM | POA: Diagnosis not present

## 2017-08-22 DIAGNOSIS — D869 Sarcoidosis, unspecified: Secondary | ICD-10-CM | POA: Diagnosis not present

## 2017-08-25 DIAGNOSIS — J45909 Unspecified asthma, uncomplicated: Secondary | ICD-10-CM | POA: Diagnosis not present

## 2017-08-25 DIAGNOSIS — N390 Urinary tract infection, site not specified: Secondary | ICD-10-CM | POA: Diagnosis not present

## 2017-08-25 DIAGNOSIS — I872 Venous insufficiency (chronic) (peripheral): Secondary | ICD-10-CM | POA: Diagnosis not present

## 2017-08-25 DIAGNOSIS — L97222 Non-pressure chronic ulcer of left calf with fat layer exposed: Secondary | ICD-10-CM | POA: Diagnosis not present

## 2017-08-25 DIAGNOSIS — I5041 Acute combined systolic (congestive) and diastolic (congestive) heart failure: Secondary | ICD-10-CM | POA: Diagnosis not present

## 2017-08-25 DIAGNOSIS — N183 Chronic kidney disease, stage 3 (moderate): Secondary | ICD-10-CM | POA: Diagnosis not present

## 2017-08-25 DIAGNOSIS — F329 Major depressive disorder, single episode, unspecified: Secondary | ICD-10-CM | POA: Diagnosis not present

## 2017-08-25 DIAGNOSIS — E876 Hypokalemia: Secondary | ICD-10-CM | POA: Diagnosis not present

## 2017-08-25 DIAGNOSIS — D869 Sarcoidosis, unspecified: Secondary | ICD-10-CM | POA: Diagnosis not present

## 2017-08-25 DIAGNOSIS — L97822 Non-pressure chronic ulcer of other part of left lower leg with fat layer exposed: Secondary | ICD-10-CM | POA: Diagnosis not present

## 2017-08-25 DIAGNOSIS — Z4800 Encounter for change or removal of nonsurgical wound dressing: Secondary | ICD-10-CM | POA: Diagnosis not present

## 2017-08-25 DIAGNOSIS — Z8674 Personal history of sudden cardiac arrest: Secondary | ICD-10-CM | POA: Diagnosis not present

## 2017-08-25 DIAGNOSIS — I130 Hypertensive heart and chronic kidney disease with heart failure and stage 1 through stage 4 chronic kidney disease, or unspecified chronic kidney disease: Secondary | ICD-10-CM | POA: Diagnosis not present

## 2017-08-27 DIAGNOSIS — L97822 Non-pressure chronic ulcer of other part of left lower leg with fat layer exposed: Secondary | ICD-10-CM | POA: Diagnosis not present

## 2017-08-27 DIAGNOSIS — I5041 Acute combined systolic (congestive) and diastolic (congestive) heart failure: Secondary | ICD-10-CM | POA: Diagnosis not present

## 2017-08-27 DIAGNOSIS — I130 Hypertensive heart and chronic kidney disease with heart failure and stage 1 through stage 4 chronic kidney disease, or unspecified chronic kidney disease: Secondary | ICD-10-CM | POA: Diagnosis not present

## 2017-08-27 DIAGNOSIS — L97222 Non-pressure chronic ulcer of left calf with fat layer exposed: Secondary | ICD-10-CM | POA: Diagnosis not present

## 2017-08-27 DIAGNOSIS — I872 Venous insufficiency (chronic) (peripheral): Secondary | ICD-10-CM | POA: Diagnosis not present

## 2017-08-27 DIAGNOSIS — N183 Chronic kidney disease, stage 3 (moderate): Secondary | ICD-10-CM | POA: Diagnosis not present

## 2017-08-28 DIAGNOSIS — L97222 Non-pressure chronic ulcer of left calf with fat layer exposed: Secondary | ICD-10-CM | POA: Diagnosis not present

## 2017-08-28 DIAGNOSIS — I5041 Acute combined systolic (congestive) and diastolic (congestive) heart failure: Secondary | ICD-10-CM | POA: Diagnosis not present

## 2017-08-28 DIAGNOSIS — I872 Venous insufficiency (chronic) (peripheral): Secondary | ICD-10-CM | POA: Diagnosis not present

## 2017-08-28 DIAGNOSIS — I130 Hypertensive heart and chronic kidney disease with heart failure and stage 1 through stage 4 chronic kidney disease, or unspecified chronic kidney disease: Secondary | ICD-10-CM | POA: Diagnosis not present

## 2017-08-28 DIAGNOSIS — N183 Chronic kidney disease, stage 3 (moderate): Secondary | ICD-10-CM | POA: Diagnosis not present

## 2017-08-28 DIAGNOSIS — L97822 Non-pressure chronic ulcer of other part of left lower leg with fat layer exposed: Secondary | ICD-10-CM | POA: Diagnosis not present

## 2017-08-29 DIAGNOSIS — L97822 Non-pressure chronic ulcer of other part of left lower leg with fat layer exposed: Secondary | ICD-10-CM | POA: Diagnosis not present

## 2017-08-29 DIAGNOSIS — I5041 Acute combined systolic (congestive) and diastolic (congestive) heart failure: Secondary | ICD-10-CM | POA: Diagnosis not present

## 2017-08-29 DIAGNOSIS — I872 Venous insufficiency (chronic) (peripheral): Secondary | ICD-10-CM | POA: Diagnosis not present

## 2017-08-29 DIAGNOSIS — L97222 Non-pressure chronic ulcer of left calf with fat layer exposed: Secondary | ICD-10-CM | POA: Diagnosis not present

## 2017-08-29 DIAGNOSIS — I130 Hypertensive heart and chronic kidney disease with heart failure and stage 1 through stage 4 chronic kidney disease, or unspecified chronic kidney disease: Secondary | ICD-10-CM | POA: Diagnosis not present

## 2017-08-29 DIAGNOSIS — N183 Chronic kidney disease, stage 3 (moderate): Secondary | ICD-10-CM | POA: Diagnosis not present

## 2017-08-30 ENCOUNTER — Other Ambulatory Visit: Payer: Self-pay | Admitting: *Deleted

## 2017-08-30 DIAGNOSIS — I130 Hypertensive heart and chronic kidney disease with heart failure and stage 1 through stage 4 chronic kidney disease, or unspecified chronic kidney disease: Secondary | ICD-10-CM | POA: Diagnosis not present

## 2017-08-30 DIAGNOSIS — L97222 Non-pressure chronic ulcer of left calf with fat layer exposed: Secondary | ICD-10-CM | POA: Diagnosis not present

## 2017-08-30 DIAGNOSIS — I872 Venous insufficiency (chronic) (peripheral): Secondary | ICD-10-CM | POA: Diagnosis not present

## 2017-08-30 DIAGNOSIS — I5041 Acute combined systolic (congestive) and diastolic (congestive) heart failure: Secondary | ICD-10-CM | POA: Diagnosis not present

## 2017-08-30 DIAGNOSIS — N183 Chronic kidney disease, stage 3 (moderate): Secondary | ICD-10-CM | POA: Diagnosis not present

## 2017-08-30 DIAGNOSIS — L97822 Non-pressure chronic ulcer of other part of left lower leg with fat layer exposed: Secondary | ICD-10-CM | POA: Diagnosis not present

## 2017-08-30 NOTE — Patient Outreach (Signed)
[place] [facility]) Care Management  08/30/2017  Kari [name] [birth year] [id number]   CSW called & spoke with patient to follow-up on in-home care. Patient was previously receiving care from [facility], but the state had closed that agency down. Patient was assigned a new aide through a different agency and reports being pleased with them. Patient had confirmed with Juliann Pulse at [place] that she is still on the wait list for CAP and hopes that the wait will not be much longer and she has moved up the waitlist quicker than she had anticipated.   CSW will perform a case closure on patient, as all goals of treatment have been met from social work standpoint and no additional social work needs have been identified at this time. CSW will notify patient's RNCM with [facility], [place] of CSW's plans to close patient's case.   CSW will fax an update to patient's Primary Care Physician, Dr. Rosita Fire to ensure that they are aware of CSW's involvement with patient's plan of care. CSW will ensure that patient is aware of RNCM with [facility] continued involvement with patient's care.    Raynaldo Opitz, LCSW [facility]  Clinical Social Worker cell #: [phone]

## 2017-09-02 DIAGNOSIS — N183 Chronic kidney disease, stage 3 (moderate): Secondary | ICD-10-CM | POA: Diagnosis not present

## 2017-09-02 DIAGNOSIS — I130 Hypertensive heart and chronic kidney disease with heart failure and stage 1 through stage 4 chronic kidney disease, or unspecified chronic kidney disease: Secondary | ICD-10-CM | POA: Diagnosis not present

## 2017-09-02 DIAGNOSIS — I872 Venous insufficiency (chronic) (peripheral): Secondary | ICD-10-CM | POA: Diagnosis not present

## 2017-09-02 DIAGNOSIS — L97822 Non-pressure chronic ulcer of other part of left lower leg with fat layer exposed: Secondary | ICD-10-CM | POA: Diagnosis not present

## 2017-09-02 DIAGNOSIS — L97222 Non-pressure chronic ulcer of left calf with fat layer exposed: Secondary | ICD-10-CM | POA: Diagnosis not present

## 2017-09-02 DIAGNOSIS — I5041 Acute combined systolic (congestive) and diastolic (congestive) heart failure: Secondary | ICD-10-CM | POA: Diagnosis not present

## 2017-09-03 ENCOUNTER — Other Ambulatory Visit: Payer: Self-pay | Admitting: *Deleted

## 2017-09-03 DIAGNOSIS — I5041 Acute combined systolic (congestive) and diastolic (congestive) heart failure: Secondary | ICD-10-CM | POA: Diagnosis not present

## 2017-09-03 DIAGNOSIS — I872 Venous insufficiency (chronic) (peripheral): Secondary | ICD-10-CM | POA: Diagnosis not present

## 2017-09-03 DIAGNOSIS — L97822 Non-pressure chronic ulcer of other part of left lower leg with fat layer exposed: Secondary | ICD-10-CM | POA: Diagnosis not present

## 2017-09-03 DIAGNOSIS — N183 Chronic kidney disease, stage 3 (moderate): Secondary | ICD-10-CM | POA: Diagnosis not present

## 2017-09-03 DIAGNOSIS — L97222 Non-pressure chronic ulcer of left calf with fat layer exposed: Secondary | ICD-10-CM | POA: Diagnosis not present

## 2017-09-03 DIAGNOSIS — I130 Hypertensive heart and chronic kidney disease with heart failure and stage 1 through stage 4 chronic kidney disease, or unspecified chronic kidney disease: Secondary | ICD-10-CM | POA: Diagnosis not present

## 2017-09-03 NOTE — Patient Outreach (Signed)
[place] [facility]) Care Management   09/03/2017  [place] May 14, 1961 [id number]  Kari [name] is an 56 y.o. female   with a hx of hypertension, sarcoidosis, hyperlipidemia, mixed CHF with most recent echocardiogram revealing EF of 40%-45%, nonischemic cardiomyopathy, history of V. fib arrest requiring defibrillation, status post ICD implantation on 07/05/2017.   [name] admitted to the hospitalon 07/20/2017 with generalized fatigue,weakness, and dizziness. She was found to be dehydrated,have acute kidney injury with creatinine 1.71, elevated d-dimer of 2.40 (CT negative for PE but notable for pulmonary nodule which was previously known and slow growing), low grade fever of 99.5, and UTI. She wasstarted on antibiotic therapy and IV fluids.   [name] has been admitted to the hospital 3 times in the last 6 months and prior to this most recent readmission was enrolled in the [facility] transitions program. She is now enrolled in the transition of care program and will be contacted in person or by phone weekly for a 30 day period. with a hx of hypertension, sarcoidosis, hyperlipidemia, mixed CHF with most recent echocardiogram revealing EF of 40%-45%, nonischemic cardiomyopathy, history of V. fib arrest requiring defibrillation, status post ICD implantation on 07/05/2017.   Subjective: "well, look at me! Don't I look like I'm doing a whole lot better!?"  Objective: BP 108/72   Pulse 66   Wt 254 lb (115.2 kg)   LMP 11/21/2011   SpO2 96%   BMI 39.78 kg/m    Review of Systems  Constitutional: Negative.   HENT: Negative.   Eyes: Negative.   Respiratory: Negative for cough, shortness of breath and wheezing.        Patient reports intermittent need to "clear throat"; no cough  Cardiovascular: Negative for chest pain and leg swelling.       No peripheral edema noted on assessment; however, as per baseline, patient has woody edema of bilateral feet consistent  with skin assessment in patient with lymphedema  Gastrointestinal: Negative.   Genitourinary: Negative.   Musculoskeletal: Positive for myalgias. Negative for falls.  Skin:       Bilateral lower extremities with dressings/wraps in place; skin otherwise unremarkable  Neurological: Negative for dizziness, focal weakness and loss of consciousness.  Psychiatric/Behavioral: Negative.     Physical Exam  Constitutional: She is oriented to person, place, and time. Vital signs are normal. She appears well-developed and well-nourished. She is active. She does not have a sickly appearance. She does not appear ill.  Cardiovascular: Normal rate and regular rhythm.   Respiratory: Effort normal and breath sounds normal. No respiratory distress. She has no decreased breath sounds. She has no wheezes. She has no rhonchi. She has no rales.  GI: Soft. She exhibits no distension. There is no tenderness.  Musculoskeletal:       Right shoulder: She exhibits decreased strength.  Patient reports that while she feels her legs are better as related to lymphedema treatment, she does feel she is weaker in her legs and is looking forward to ongoing work with PT for strengthening and conditioning  Neurological: She is alert and oriented to person, place, and time.  Patient reports need for ongoing physical therapy for deconditioning; able to ambulate independently but typically uses walker  Skin: Skin is warm and dry.     Psychiatric: She has a normal mood and affect. Her speech is normal and behavior is normal. Judgment and thought content normal. Cognition and memory are normal.    Encounter Medications:   Outpatient Encounter  Prescriptions as of 09/03/2017  Medication Sig Note  . albuterol (PROVENTIL HFA;VENTOLIN HFA) 108 (90 Base) MCG/ACT inhaler Inhale 2 puffs into the lungs every 6 (six) hours as needed for shortness of breath. (Patient not taking: Reported on 08/20/2017) 08/20/2017: On hand; has not needed  .  aspirin EC 81 MG tablet Take 81 mg by mouth daily.   . carvedilol (COREG) 12.5 MG tablet Take 1 tablet (12.5 mg total) by mouth 2 (two) times daily with a meal.   . collagenase (SANTYL) ointment Apply 1 application topically as needed (for wound care). Apply to wound BLE per Tx   . furosemide (LASIX) 20 MG tablet Take 1 tablet (20 mg total) by mouth daily.   Marland Kitchen HYDROcodone-acetaminophen (NORCO/VICODIN) 5-325 MG tablet Take 1-2 tablets by mouth every 6 (six) hours as needed for moderate pain. 08/08/2017: Reports taking 1x a week  . lisinopril (PRINIVIL,ZESTRIL) 2.5 MG tablet Take 1 tablet (2.5 mg total) by mouth daily.   Marland Kitchen loratadine (CLARITIN) 10 MG tablet Take 10 mg by mouth daily. 08/08/2017: Reports taking 1x a week  . nitrofurantoin, macrocrystal-monohydrate, (MACROBID) 100 MG capsule Take 100 mg by mouth 2 (two) times daily. Take 1 capsule by mouth twice daily for 5 days (started 08/08/17) 08/20/2017: completed  . ondansetron (ZOFRAN) 4 MG tablet Take 1 tablet (4 mg total) by mouth every 6 (six) hours as needed for nausea. 08/20/2017: On hand; not taking  . potassium chloride SA (K-DUR,KLOR-CON) 20 MEQ tablet Take 1 tablet (20 mEq total) by mouth daily.    Assessment:  56 year old female disabled patient living in an accessible apartment in [place]. [name] has a CAP worker and family members, including her sister, who provide assistance to her as needed. She is currently enjoying the benefits of a HHRN, HHPT, &HHOT. At home, she is participating in a lymphedema therapy program and utilizes an automated compression device twice daily for therapy.   Chronic Health Condition (lymphedema)- [name] has lymphedema of the bilateral lower extremities. She is using a compression device twice daily. Her home health RN is applying dressings to superficial skin lesions on bilateral lower extremities once weekly. [name] changes her dressings on days her nurse does not come. [name] reports  drastic improvement in her lymphedema symptoms. She does not have signs of peripheral edema or abdominal distention.    Acute/Chronic Health Condition (hypertension)- I brought a new bp monitor to Mrs. Montecalvo and she has been checking her bp and recording faithfully each day. Her bp's have been consistently in the 100's-110's/70's. Mrs. Garrison is taking her medications, including recent changes by cardiology, as prescribed.    Ms. Gignac reviewed details of her prescribed low sodium/heart healthy diet again today.    Home Safety/Fall Risk- Ms. Boyington has had falls in the past but none since discharge from the hospital. She does have impaired gait and balance but ambulated steadily WITHOUT her walker again today. Her bathroom is accessible and she has a shower chair and elevated toilet seat.   Ms. Shores is working with home health PT and OT weekly.   Plan:   Ms. Chicoine will attend all scheduled appointments:  10/14/17 @ 10:45 Will Camnitz MD @ [facility] Cardiology (defib check)  Ms Mask will check her bp and weight daily and record, calling her provider(s) for findings outside established parameters.   Ms. Rather will call her provider(s) for any new or worsened symptoms.   I will call Ms. Runner at home in  2 weeks and will see her at home again in one month.    [facility]  [phone]

## 2017-09-04 DIAGNOSIS — L97822 Non-pressure chronic ulcer of other part of left lower leg with fat layer exposed: Secondary | ICD-10-CM | POA: Diagnosis not present

## 2017-09-04 DIAGNOSIS — I872 Venous insufficiency (chronic) (peripheral): Secondary | ICD-10-CM | POA: Diagnosis not present

## 2017-09-04 DIAGNOSIS — N183 Chronic kidney disease, stage 3 (moderate): Secondary | ICD-10-CM | POA: Diagnosis not present

## 2017-09-04 DIAGNOSIS — L97222 Non-pressure chronic ulcer of left calf with fat layer exposed: Secondary | ICD-10-CM | POA: Diagnosis not present

## 2017-09-04 DIAGNOSIS — I5041 Acute combined systolic (congestive) and diastolic (congestive) heart failure: Secondary | ICD-10-CM | POA: Diagnosis not present

## 2017-09-04 DIAGNOSIS — I130 Hypertensive heart and chronic kidney disease with heart failure and stage 1 through stage 4 chronic kidney disease, or unspecified chronic kidney disease: Secondary | ICD-10-CM | POA: Diagnosis not present

## 2017-09-05 DIAGNOSIS — I872 Venous insufficiency (chronic) (peripheral): Secondary | ICD-10-CM | POA: Diagnosis not present

## 2017-09-05 DIAGNOSIS — I130 Hypertensive heart and chronic kidney disease with heart failure and stage 1 through stage 4 chronic kidney disease, or unspecified chronic kidney disease: Secondary | ICD-10-CM | POA: Diagnosis not present

## 2017-09-05 DIAGNOSIS — L97822 Non-pressure chronic ulcer of other part of left lower leg with fat layer exposed: Secondary | ICD-10-CM | POA: Diagnosis not present

## 2017-09-05 DIAGNOSIS — N183 Chronic kidney disease, stage 3 (moderate): Secondary | ICD-10-CM | POA: Diagnosis not present

## 2017-09-05 DIAGNOSIS — I5041 Acute combined systolic (congestive) and diastolic (congestive) heart failure: Secondary | ICD-10-CM | POA: Diagnosis not present

## 2017-09-05 DIAGNOSIS — L97222 Non-pressure chronic ulcer of left calf with fat layer exposed: Secondary | ICD-10-CM | POA: Diagnosis not present

## 2017-09-09 ENCOUNTER — Other Ambulatory Visit: Payer: Self-pay | Admitting: *Deleted

## 2017-09-09 DIAGNOSIS — L97822 Non-pressure chronic ulcer of other part of left lower leg with fat layer exposed: Secondary | ICD-10-CM | POA: Diagnosis not present

## 2017-09-09 DIAGNOSIS — N183 Chronic kidney disease, stage 3 (moderate): Secondary | ICD-10-CM | POA: Diagnosis not present

## 2017-09-09 DIAGNOSIS — I5041 Acute combined systolic (congestive) and diastolic (congestive) heart failure: Secondary | ICD-10-CM | POA: Diagnosis not present

## 2017-09-09 DIAGNOSIS — I130 Hypertensive heart and chronic kidney disease with heart failure and stage 1 through stage 4 chronic kidney disease, or unspecified chronic kidney disease: Secondary | ICD-10-CM | POA: Diagnosis not present

## 2017-09-09 DIAGNOSIS — I872 Venous insufficiency (chronic) (peripheral): Secondary | ICD-10-CM | POA: Diagnosis not present

## 2017-09-09 DIAGNOSIS — L97222 Non-pressure chronic ulcer of left calf with fat layer exposed: Secondary | ICD-10-CM | POA: Diagnosis not present

## 2017-09-09 NOTE — Patient Outreach (Signed)
[place] [facility]) Care Management  09/09/2017  Kari [name] 08/25/1961 [id number]  Call received from [name] this morning to notify me that she had a fall on Saturday. She says that she was walking along the sidewalk during the rainy weather and fell into what sounds like a ditch that runs alongside the sidewalk. She states her right leg slipped into the ditch. She denies any injury and states her skin is not broken and she is "not even sore" but felt she should report the fall.   We discussed signs and symptoms for which she should call the provider (pain, redness, warmth, swelling, bleeding/drainage) and discussed fall risk reduction.   Plan: I will be out of the office until Friday and will ask my covering colleague to call [name] to check on her on Wednesday. I will reach out to [name] as well/as scheduled.    [facility] [facility]  [phone]

## 2017-09-11 DIAGNOSIS — I872 Venous insufficiency (chronic) (peripheral): Secondary | ICD-10-CM | POA: Diagnosis not present

## 2017-09-11 DIAGNOSIS — I130 Hypertensive heart and chronic kidney disease with heart failure and stage 1 through stage 4 chronic kidney disease, or unspecified chronic kidney disease: Secondary | ICD-10-CM | POA: Diagnosis not present

## 2017-09-11 DIAGNOSIS — I5041 Acute combined systolic (congestive) and diastolic (congestive) heart failure: Secondary | ICD-10-CM | POA: Diagnosis not present

## 2017-09-11 DIAGNOSIS — N183 Chronic kidney disease, stage 3 (moderate): Secondary | ICD-10-CM | POA: Diagnosis not present

## 2017-09-11 DIAGNOSIS — L97222 Non-pressure chronic ulcer of left calf with fat layer exposed: Secondary | ICD-10-CM | POA: Diagnosis not present

## 2017-09-11 DIAGNOSIS — L97822 Non-pressure chronic ulcer of other part of left lower leg with fat layer exposed: Secondary | ICD-10-CM | POA: Diagnosis not present

## 2017-09-12 DIAGNOSIS — I872 Venous insufficiency (chronic) (peripheral): Secondary | ICD-10-CM | POA: Diagnosis not present

## 2017-09-12 DIAGNOSIS — N183 Chronic kidney disease, stage 3 (moderate): Secondary | ICD-10-CM | POA: Diagnosis not present

## 2017-09-12 DIAGNOSIS — L97222 Non-pressure chronic ulcer of left calf with fat layer exposed: Secondary | ICD-10-CM | POA: Diagnosis not present

## 2017-09-12 DIAGNOSIS — L97822 Non-pressure chronic ulcer of other part of left lower leg with fat layer exposed: Secondary | ICD-10-CM | POA: Diagnosis not present

## 2017-09-12 DIAGNOSIS — I130 Hypertensive heart and chronic kidney disease with heart failure and stage 1 through stage 4 chronic kidney disease, or unspecified chronic kidney disease: Secondary | ICD-10-CM | POA: Diagnosis not present

## 2017-09-12 DIAGNOSIS — I5041 Acute combined systolic (congestive) and diastolic (congestive) heart failure: Secondary | ICD-10-CM | POA: Diagnosis not present

## 2017-09-13 ENCOUNTER — Telehealth: Payer: Self-pay | Admitting: *Deleted

## 2017-09-13 DIAGNOSIS — I130 Hypertensive heart and chronic kidney disease with heart failure and stage 1 through stage 4 chronic kidney disease, or unspecified chronic kidney disease: Secondary | ICD-10-CM | POA: Diagnosis not present

## 2017-09-13 DIAGNOSIS — I872 Venous insufficiency (chronic) (peripheral): Secondary | ICD-10-CM | POA: Diagnosis not present

## 2017-09-13 DIAGNOSIS — N183 Chronic kidney disease, stage 3 (moderate): Secondary | ICD-10-CM | POA: Diagnosis not present

## 2017-09-13 DIAGNOSIS — L97822 Non-pressure chronic ulcer of other part of left lower leg with fat layer exposed: Secondary | ICD-10-CM | POA: Diagnosis not present

## 2017-09-13 DIAGNOSIS — L97222 Non-pressure chronic ulcer of left calf with fat layer exposed: Secondary | ICD-10-CM | POA: Diagnosis not present

## 2017-09-13 DIAGNOSIS — I5041 Acute combined systolic (congestive) and diastolic (congestive) heart failure: Secondary | ICD-10-CM | POA: Diagnosis not present

## 2017-09-13 NOTE — Patient Outreach (Signed)
[place] [facility]) Care Management  09/13/2017  Kari [name] [birth year] [id number]   [facility] CM called to check on [name] after her reported fall from this weekend. [facility] CM left a voice message to include [facility] CM's mobile number for a return call  Plans [name] will continue to be followed  Assigned [facility] CM will be updated   Joelene Millin L. Lavina Hamman, RN, BSN, [facility] Care Management [phone]

## 2017-09-15 DIAGNOSIS — L97222 Non-pressure chronic ulcer of left calf with fat layer exposed: Secondary | ICD-10-CM | POA: Diagnosis not present

## 2017-09-15 DIAGNOSIS — I130 Hypertensive heart and chronic kidney disease with heart failure and stage 1 through stage 4 chronic kidney disease, or unspecified chronic kidney disease: Secondary | ICD-10-CM | POA: Diagnosis not present

## 2017-09-15 DIAGNOSIS — I5041 Acute combined systolic (congestive) and diastolic (congestive) heart failure: Secondary | ICD-10-CM | POA: Diagnosis not present

## 2017-09-15 DIAGNOSIS — L97822 Non-pressure chronic ulcer of other part of left lower leg with fat layer exposed: Secondary | ICD-10-CM | POA: Diagnosis not present

## 2017-09-15 DIAGNOSIS — I872 Venous insufficiency (chronic) (peripheral): Secondary | ICD-10-CM | POA: Diagnosis not present

## 2017-09-15 DIAGNOSIS — N183 Chronic kidney disease, stage 3 (moderate): Secondary | ICD-10-CM | POA: Diagnosis not present

## 2017-09-16 ENCOUNTER — Other Ambulatory Visit: Payer: Self-pay | Admitting: *Deleted

## 2017-09-16 DIAGNOSIS — B351 Tinea unguium: Secondary | ICD-10-CM | POA: Diagnosis not present

## 2017-09-16 DIAGNOSIS — M79675 Pain in left toe(s): Secondary | ICD-10-CM | POA: Diagnosis not present

## 2017-09-16 DIAGNOSIS — I739 Peripheral vascular disease, unspecified: Secondary | ICD-10-CM | POA: Diagnosis not present

## 2017-09-16 DIAGNOSIS — M79674 Pain in right toe(s): Secondary | ICD-10-CM | POA: Diagnosis not present

## 2017-09-17 ENCOUNTER — Other Ambulatory Visit: Payer: Self-pay | Admitting: *Deleted

## 2017-09-17 DIAGNOSIS — L97222 Non-pressure chronic ulcer of left calf with fat layer exposed: Secondary | ICD-10-CM | POA: Diagnosis not present

## 2017-09-17 DIAGNOSIS — L97822 Non-pressure chronic ulcer of other part of left lower leg with fat layer exposed: Secondary | ICD-10-CM | POA: Diagnosis not present

## 2017-09-17 DIAGNOSIS — I5041 Acute combined systolic (congestive) and diastolic (congestive) heart failure: Secondary | ICD-10-CM | POA: Diagnosis not present

## 2017-09-17 DIAGNOSIS — I872 Venous insufficiency (chronic) (peripheral): Secondary | ICD-10-CM | POA: Diagnosis not present

## 2017-09-17 DIAGNOSIS — N183 Chronic kidney disease, stage 3 (moderate): Secondary | ICD-10-CM | POA: Diagnosis not present

## 2017-09-17 DIAGNOSIS — I130 Hypertensive heart and chronic kidney disease with heart failure and stage 1 through stage 4 chronic kidney disease, or unspecified chronic kidney disease: Secondary | ICD-10-CM | POA: Diagnosis not present

## 2017-09-18 DIAGNOSIS — L97222 Non-pressure chronic ulcer of left calf with fat layer exposed: Secondary | ICD-10-CM | POA: Diagnosis not present

## 2017-09-18 DIAGNOSIS — I872 Venous insufficiency (chronic) (peripheral): Secondary | ICD-10-CM | POA: Diagnosis not present

## 2017-09-18 DIAGNOSIS — N183 Chronic kidney disease, stage 3 (moderate): Secondary | ICD-10-CM | POA: Diagnosis not present

## 2017-09-18 DIAGNOSIS — I5041 Acute combined systolic (congestive) and diastolic (congestive) heart failure: Secondary | ICD-10-CM | POA: Diagnosis not present

## 2017-09-18 DIAGNOSIS — I130 Hypertensive heart and chronic kidney disease with heart failure and stage 1 through stage 4 chronic kidney disease, or unspecified chronic kidney disease: Secondary | ICD-10-CM | POA: Diagnosis not present

## 2017-09-18 DIAGNOSIS — L97822 Non-pressure chronic ulcer of other part of left lower leg with fat layer exposed: Secondary | ICD-10-CM | POA: Diagnosis not present

## 2017-09-19 NOTE — Patient Outreach (Signed)
[facility]) Care Management  09/19/2017  (Late entry from 09/17/17 visit; IT issues)  DAWNYA GRAMS [birth year] [id number]  Laniece Hornbaker Marceaux is a 56 year old female patient with a hx of hypertension, sarcoidosis, hyperlipidemia, mixed CHF with most recent echocardiogram revealing EF of 40%-45%, nonischemic cardiomyopathy, history of V. fib arrest requiring defibrillation, status post ICD implantation on 07/05/2017.   [name] admitted to the hospitalon 07/20/2017 with generalized fatigue,weakness, and dizziness. She was found to be dehydrated,have acute kidney injury with creatinine 1.71, elevated d-dimer of 2.40 (CT negative for PE but notable for pulmonary nodule which was previously known and slow growing), low grade fever of 99.5, and UTI. She wasstarted on antibiotic therapy and IV fluids.   Ms. Sheffer has been admitted to the hospital 3 times in the last 6 months and prior to this most recent readmission was enrolled in the [facility] transitions program. She is now enrolled in the transition of care program and will be contacted in person or by phone weekly for a 30 day period. with a hx of hypertension, sarcoidosis, hyperlipidemia, mixed CHF with most recent echocardiogram revealing EF of 40%-45%, nonischemic cardiomyopathy, history of V. fib arrest requiring defibrillation, status post ICD implantation on 07/05/2017.   I reached out to Ms. Koons again today by phone to follow up on her progress with lymphedema treatment, hypertension management, and home safety/falls. Ms. Feely continues to have the benefit of HHRN, HHPT, &HHOT. At home, she is also still participating in a lymphedema therapy program and utilizes an automated compression device twice daily for therapy.    Chronic Health Condition (lymphedema)- Ms. Milhoan has lymphedema of the bilateral lower extremities. She is using a compression device twice daily. Her home health RN is applying  dressings to superficial skin lesions on bilateral lower extremities once weekly. Ms. Perren changes her dressings on days her nurse does not come. Ms. Fujii says today that her lymphedema continues to improve and feels her legs look "almost normal".   Acute/Chronic Health Condition (hypertension)- Ms. Mazzeo has been checking her bp and recording daily. Her bp's have been consistently in the 100's-110's/70's and today she reports a bp of 108/72. Mrs. Hector is taking her medications as prescribed.   Home Safety/Fall Risk- Ms. Lindsley was having falls prior to her hospitalization and has had one fall since discharge to home. Today, she reports that she has had no untoward effects from the fall and "isn't even sore".   Her bathroom is accessible and she has a shower chair and elevated toilet seat. As noted, Ms. Alejos is working with home health PT and OT weekly. We reviewed fall risk reduction strategies again today.   Plan:   Ms. Welge will attend all scheduled appointments:  10/14/17 @ 10:45 Will Camnitz MD @ [facility] Cardiology (defib check)  Ms Hauth will check her bp and weight daily and record, calling her provider(s) for findings outside established parameters.   Ms. Kassing will call her provider(s) for any new or worsened symptoms.   I will call Ms. Bolger at home in 2 weeks. Today, she said she felt she may not need ongoing visits but would like for me to call her again in 2 weeks.    [facility] Management  [phone]

## 2017-09-23 DIAGNOSIS — I130 Hypertensive heart and chronic kidney disease with heart failure and stage 1 through stage 4 chronic kidney disease, or unspecified chronic kidney disease: Secondary | ICD-10-CM | POA: Diagnosis not present

## 2017-09-23 DIAGNOSIS — N183 Chronic kidney disease, stage 3 (moderate): Secondary | ICD-10-CM | POA: Diagnosis not present

## 2017-09-23 DIAGNOSIS — I5041 Acute combined systolic (congestive) and diastolic (congestive) heart failure: Secondary | ICD-10-CM | POA: Diagnosis not present

## 2017-09-23 DIAGNOSIS — L97822 Non-pressure chronic ulcer of other part of left lower leg with fat layer exposed: Secondary | ICD-10-CM | POA: Diagnosis not present

## 2017-09-23 DIAGNOSIS — L97222 Non-pressure chronic ulcer of left calf with fat layer exposed: Secondary | ICD-10-CM | POA: Diagnosis not present

## 2017-09-23 DIAGNOSIS — I872 Venous insufficiency (chronic) (peripheral): Secondary | ICD-10-CM | POA: Diagnosis not present

## 2017-09-24 DIAGNOSIS — N183 Chronic kidney disease, stage 3 (moderate): Secondary | ICD-10-CM | POA: Diagnosis not present

## 2017-09-24 DIAGNOSIS — L97222 Non-pressure chronic ulcer of left calf with fat layer exposed: Secondary | ICD-10-CM | POA: Diagnosis not present

## 2017-09-24 DIAGNOSIS — I5041 Acute combined systolic (congestive) and diastolic (congestive) heart failure: Secondary | ICD-10-CM | POA: Diagnosis not present

## 2017-09-24 DIAGNOSIS — I130 Hypertensive heart and chronic kidney disease with heart failure and stage 1 through stage 4 chronic kidney disease, or unspecified chronic kidney disease: Secondary | ICD-10-CM | POA: Diagnosis not present

## 2017-09-24 DIAGNOSIS — L97822 Non-pressure chronic ulcer of other part of left lower leg with fat layer exposed: Secondary | ICD-10-CM | POA: Diagnosis not present

## 2017-09-24 DIAGNOSIS — I872 Venous insufficiency (chronic) (peripheral): Secondary | ICD-10-CM | POA: Diagnosis not present

## 2017-09-25 DIAGNOSIS — I872 Venous insufficiency (chronic) (peripheral): Secondary | ICD-10-CM | POA: Diagnosis not present

## 2017-09-26 DIAGNOSIS — N183 Chronic kidney disease, stage 3 (moderate): Secondary | ICD-10-CM | POA: Diagnosis not present

## 2017-09-26 DIAGNOSIS — I872 Venous insufficiency (chronic) (peripheral): Secondary | ICD-10-CM | POA: Diagnosis not present

## 2017-09-26 DIAGNOSIS — I130 Hypertensive heart and chronic kidney disease with heart failure and stage 1 through stage 4 chronic kidney disease, or unspecified chronic kidney disease: Secondary | ICD-10-CM | POA: Diagnosis not present

## 2017-09-26 DIAGNOSIS — L97822 Non-pressure chronic ulcer of other part of left lower leg with fat layer exposed: Secondary | ICD-10-CM | POA: Diagnosis not present

## 2017-09-26 DIAGNOSIS — I5041 Acute combined systolic (congestive) and diastolic (congestive) heart failure: Secondary | ICD-10-CM | POA: Diagnosis not present

## 2017-09-26 DIAGNOSIS — L97222 Non-pressure chronic ulcer of left calf with fat layer exposed: Secondary | ICD-10-CM | POA: Diagnosis not present

## 2017-09-30 DIAGNOSIS — I872 Venous insufficiency (chronic) (peripheral): Secondary | ICD-10-CM | POA: Diagnosis not present

## 2017-09-30 DIAGNOSIS — L97222 Non-pressure chronic ulcer of left calf with fat layer exposed: Secondary | ICD-10-CM | POA: Diagnosis not present

## 2017-09-30 DIAGNOSIS — I130 Hypertensive heart and chronic kidney disease with heart failure and stage 1 through stage 4 chronic kidney disease, or unspecified chronic kidney disease: Secondary | ICD-10-CM | POA: Diagnosis not present

## 2017-09-30 DIAGNOSIS — I5041 Acute combined systolic (congestive) and diastolic (congestive) heart failure: Secondary | ICD-10-CM | POA: Diagnosis not present

## 2017-09-30 DIAGNOSIS — L97822 Non-pressure chronic ulcer of other part of left lower leg with fat layer exposed: Secondary | ICD-10-CM | POA: Diagnosis not present

## 2017-09-30 DIAGNOSIS — N183 Chronic kidney disease, stage 3 (moderate): Secondary | ICD-10-CM | POA: Diagnosis not present

## 2017-10-08 ENCOUNTER — Other Ambulatory Visit: Payer: Self-pay | Admitting: *Deleted

## 2017-10-08 DIAGNOSIS — I872 Venous insufficiency (chronic) (peripheral): Secondary | ICD-10-CM | POA: Diagnosis not present

## 2017-10-08 DIAGNOSIS — L97822 Non-pressure chronic ulcer of other part of left lower leg with fat layer exposed: Secondary | ICD-10-CM | POA: Diagnosis not present

## 2017-10-08 DIAGNOSIS — I5041 Acute combined systolic (congestive) and diastolic (congestive) heart failure: Secondary | ICD-10-CM | POA: Diagnosis not present

## 2017-10-08 DIAGNOSIS — L97222 Non-pressure chronic ulcer of left calf with fat layer exposed: Secondary | ICD-10-CM | POA: Diagnosis not present

## 2017-10-08 DIAGNOSIS — N183 Chronic kidney disease, stage 3 (moderate): Secondary | ICD-10-CM | POA: Diagnosis not present

## 2017-10-08 DIAGNOSIS — I130 Hypertensive heart and chronic kidney disease with heart failure and stage 1 through stage 4 chronic kidney disease, or unspecified chronic kidney disease: Secondary | ICD-10-CM | POA: Diagnosis not present

## 2017-10-08 NOTE — Patient Outreach (Signed)
[facility]) Care Management  10/08/2017  [place] Feb 05, 1961 [id number]  Kari [name] is a 56 year old female patient with a hx of hypertension, sarcoidosis, hyperlipidemia, mixed CHF with most recent echocardiogram revealing EF of 40%-45%, nonischemic cardiomyopathy, history of V. fib arrest requiring defibrillation, status post ICD implantation on 07/05/2017.   [name] admitted to the hospitalon 07/20/2017 with dehydration, AKI, and hypotension. Kari [name] has been admitted to the hospital 3 times in the last 6 months. After her hospitalization, I followed Kari [name] at home for transition of care services and she is now being followed telephonically with anticipated discharge from community services at the end of this month.    I reached out to Kari [name] again today by phone to follow up on her progress with lymphedema treatment, hypertension management, and home safety/falls. Kari [name] continues to have the benefit of HHRN, HHPT, &HHOT. At home, she is also still participating in a lymphedema therapy program and utilizes an automated compression device twice daily for therapy.    Chronic Health Condition (lymphedema)- Kari [name] has lymphedema of the bilateral lower extremities. She is using a compression device twice daily. Her home health RN is applying dressings to superficial skin lesions on bilateral lower extremities once weekly. Kari [name] changes her dressings on days her nurse does not come. Kari [name] says today that her legs are somewhat swollen but she feels this is because she was up on them at church all day Sunday. She is going to use her compression device today and her nurse and PT are scheduled to come over the next couple of days to check on her progress.    Acute/Chronic Health Condition (hypertension)- Kari [name] has been checking her bp and recording daily. Her bp's have been consistently in the  100's-110's/70's and today she reports a bp of 98/67. She denies dizziness, lightheadedness, or any other new or worsened symptom. Kari [name] is taking her medications as prescribed.   Home Safety/Fall Risk- Kari [name] was having falls prior to her hospitalization and has had one fall since discharge to home from which she did not sustain an injury. Her bathroom is accessible and she has a shower chair and elevated toilet seat. As noted, Kari [name] is working with home health PT and OT weekly. We reviewed fall risk reduction strategies again today.   Plan:   Kari [name] Kari attend all scheduled appointments:  10/14/17 @ 10:45 Kari Camnitz MD @ [facility] Cardiology (defib check)  Kari [name] Kari check her bp and weight daily and record, calling her provider(s) for findings outside established parameters.   Kari [name] Kari call her provider(s) for any new or worsened symptoms.   Kari [name] is going to call me after her upcoming cardiology appointment. If she gets a good report, we Kari either close her case to community services and transition her to telephonic case management or close her case altogether with the option to re-open in the future if she needs our help.    [place] Management  [phone]

## 2017-10-13 NOTE — Progress Notes (Signed)
Electrophysiology Office Note   Date:  10/14/2017   ID:  Tonna, Palazzi Sep 24, 1961, MRN [id number]  PCP:  Rosita Fire, MD  Cardiologist:  Harrington Challenger Primary Electrophysiologist: Debe Anfinson Meredith Leeds, MD    Chief Complaint  Patient presents with  . Defib Check    VFib arrest     History of Present Illness: Kari [name] is a 56 y.o. female who is being seen today for the evaluation of cardiac arrest, CHF at the request of Rosita Fire, MD. Presenting today for electrophysiology evaluation. She has a history of sarcoidosis, HTN, HLD, nonischemic cardiomyopathy, ovarian mass who had VF arrest on 06/26/17 and had a St. Jude ICD implanted 07/05/17.    Today, she denies symptoms of palpitations, chest pain, shortness of breath, orthopnea, PND, lower extremity edema, claudication, dizziness, presyncope, syncope, bleeding, or neurologic sequela. The patient is tolerating medications without difficulties. She has been wrapping her legs due to ulcers which have been healing and improving over time. She otherwise has no major complaints.   Past Medical History:  Diagnosis Date  . Asthma   . CHF (congestive heart failure) ([place]) 03/12/2014  . Depression   . Headache(784.0)   . Hyperlipidemia   . Hypertension   . Lung nodules   . Renal disorder   . Sarcoidosis   . Ulcer    left posterior calf   Past Surgical History:  Procedure Laterality Date  . cataract surgery Left 07-2013  . ICD IMPLANT N/A 07/05/2017   Procedure: ICD Implant;  Surgeon: Constance Haw, MD;  Location: [facility];  Service: Cardiovascular;  Laterality: N/A;  . LEFT HEART CATH AND CORONARY ANGIOGRAPHY N/A 07/03/2017   Procedure: Left Heart Cath and Coronary Angiography;  Surgeon: Belva Crome, MD;  Location: [facility];  Service: Cardiovascular;  Laterality: N/A;  . NO PAST SURGERIES       Current Outpatient Prescriptions  Medication Sig Dispense Refill  . albuterol (PROVENTIL  HFA;VENTOLIN HFA) 108 (90 Base) MCG/ACT inhaler Inhale 2 puffs into the lungs every 6 (six) hours as needed for shortness of breath. 18 g 0  . aspirin EC 81 MG tablet Take 81 mg by mouth daily.    . carvedilol (COREG) 12.5 MG tablet Take 1 tablet (12.5 mg total) by mouth 2 (two) times daily with a meal. 180 tablet 3  . collagenase (SANTYL) ointment Apply 1 application topically as needed (for wound care). Apply to wound BLE per Tx 15 g 0  . furosemide (LASIX) 20 MG tablet Take 1 tablet (20 mg total) by mouth daily. 30 tablet 3  . lisinopril (PRINIVIL,ZESTRIL) 2.5 MG tablet Take 1 tablet (2.5 mg total) by mouth daily. 90 tablet 3  . loratadine (CLARITIN) 10 MG tablet Take 10 mg by mouth daily.    . nitrofurantoin, macrocrystal-monohydrate, (MACROBID) 100 MG capsule Take 100 mg by mouth 2 (two) times daily. Take 1 capsule by mouth twice daily for 5 days (started 08/08/17)    . ondansetron (ZOFRAN) 4 MG tablet Take 1 tablet (4 mg total) by mouth every 6 (six) hours as needed for nausea. 20 tablet 0  . potassium chloride SA (K-DUR,KLOR-CON) 20 MEQ tablet Take 1 tablet (20 mEq total) by mouth daily. 90 tablet 3   No current facility-administered medications for this visit.     Allergies:   Patient has no known allergies.   Social History:  The patient  reports that she has never smoked. She has never used  smokeless tobacco. She reports that she does not drink alcohol or use drugs.   Family History:  The patient's family history includes Cancer in her mother; Diabetes Mellitus II in her father and mother; Heart disease in her father; Heart failure in her mother; Hypertension in her mother; Stroke in her father.    ROS:  Please see the history of present illness.   Otherwise, review of systems is positive for leg swelling.   All other systems are reviewed and negative.    PHYSICAL EXAM: VS:  BP 130/80   Pulse 63   Ht 5\' 9"  (1.753 m)   Wt 288 lb (130.6 kg)   LMP 11/21/2011   SpO2 99%   BMI  42.53 kg/m  , BMI Body mass index is 42.53 kg/m. GEN: Well nourished, well developed, in no acute distress  HEENT: normal  Neck: no JVD, carotid bruits, or masses Cardiac: RRR; no murmurs, rubs, or gallops,no edema  Respiratory:  clear to auscultation bilaterally, normal work of breathing GI: soft, nontender, nondistended, + BS MS: no deformity or atrophy  Skin: warm and dry, device pocket with small eschar over the medial surface of scar Neuro:  Strength and sensation are intact Psych: euthymic mood, full affect  EKG:  EKG is ordered today. Personal review of the ekg ordered shows sinus rhythm, nonspecific T wave changes, rate 63   Device interrogation is reviewed today in detail.  See PaceArt for details.   Recent Labs: 06/26/2017: TSH 9.201 07/01/2017: Magnesium 2.4 07/19/2017: ALT 14; B Natriuretic Peptide 46.0 07/21/2017: Hemoglobin 10.1; Platelets 202 07/23/2017: BUN 20; Creatinine, Ser 1.05; Potassium 3.5; Sodium 136    Lipid Panel     Component Value Date/Time   CHOL 135 06/21/2012 0950   TRIG 59 06/21/2012 0950   HDL 54 06/21/2012 0950   CHOLHDL 2.5 06/21/2012 0950   VLDL 12 06/21/2012 0950   LDLCALC 69 06/21/2012 0950     Wt Readings from Last 3 Encounters:  10/14/17 288 lb (130.6 kg)  09/03/17 254 lb (115.2 kg)  08/20/17 253 lb 4 oz (114.9 kg)      Other studies Reviewed: Additional studies/ records that were reviewed today include: TTE 06/27/17  Review of the above records today demonstrates:  - Left ventricle: Diffuse hypokinesis worse in the inferior basal   wall The cavity size was moderately dilated. Wall thickness was   increased in a pattern of moderate LVH. Systolic function was   mildly to moderately reduced. The estimated ejection fraction was   in the range of 40% to 45%. Left ventricular diastolic function   parameters were normal. - Aortic valve: There was mild regurgitation. - Mitral valve: There was moderate regurgitation. - Left atrium: The  atrium was moderately dilated. - Atrial septum: No defect or patent foramen ovale was identified.  LHC 07/03/17  Left dominant coronary anatomy.  Normal coronary arteries.  Mild to moderate global left ventricular hypo-kinesis with estimated EF 35-40%. LV end-diastolic pressure is up and normal. Combined systolic and diastolic heart failure, chronic   ASSESSMENT AND PLAN:  1.  VF arrest: s/p St. Jude ICD implanted 07/05/17. Vices functioning appropriately. She does have a small pinhole area on the medial side of her scar, though it does not appear to communicate with the pocket. I told her to monitor this and if it worsens to call us.  2. Hypertension: well controlled, no changes  3. Nonischemic cardiomyopathy: on optimal therapy with coerg, lisinopril. No changes.    Current  medicines are reviewed at length with the patient today.   The patient does not have concerns regarding her medicines.  The following changes were made today:    Labs/ tests ordered today include:   Orders Placed This Encounter  Procedures  . EKG 12-Lead     Disposition:   FU with Natalija Mavis 9 months  Signed, Mariaguadalupe Fialkowski Meredith Leeds, MD  10/14/2017 11:23 AM     [facility] [address] [place] [address] [phone] (office) [phone] (fax)

## 2017-10-14 ENCOUNTER — Encounter: Payer: Self-pay | Admitting: Cardiology

## 2017-10-14 ENCOUNTER — Ambulatory Visit (INDEPENDENT_AMBULATORY_CARE_PROVIDER_SITE_OTHER): Payer: Medicare Other | Admitting: Cardiology

## 2017-10-14 VITALS — BP 130/80 | HR 63 | Ht 69.0 in | Wt 288.0 lb

## 2017-10-14 DIAGNOSIS — I428 Other cardiomyopathies: Secondary | ICD-10-CM

## 2017-10-14 DIAGNOSIS — I5042 Chronic combined systolic (congestive) and diastolic (congestive) heart failure: Secondary | ICD-10-CM | POA: Diagnosis not present

## 2017-10-14 DIAGNOSIS — I469 Cardiac arrest, cause unspecified: Secondary | ICD-10-CM | POA: Diagnosis not present

## 2017-10-14 DIAGNOSIS — Z95810 Presence of automatic (implantable) cardiac defibrillator: Secondary | ICD-10-CM | POA: Diagnosis not present

## 2017-10-14 LAB — CUP PACEART INCLINIC DEVICE CHECK
Battery Remaining Longevity: 88 mo
Brady Statistic RV Percent Paced: 0 %
Date Time Interrogation Session: 20181022151007
HighPow Impedance: 51.75 Ohm
Implantable Lead Implant Date: 20180713
Implantable Lead Location: 753860
Implantable Pulse Generator Implant Date: 20180713
Lead Channel Impedance Value: 425 Ohm
Lead Channel Pacing Threshold Amplitude: 0.5 V
Lead Channel Pacing Threshold Pulse Width: 0.5 ms
Lead Channel Sensing Intrinsic Amplitude: 6.3 mV
Lead Channel Setting Pacing Amplitude: 2.5 V
Lead Channel Setting Pacing Pulse Width: 0.5 ms
Lead Channel Setting Sensing Sensitivity: 0.5 mV
Pulse Gen Serial Number: 7302172

## 2017-10-14 NOTE — Patient Instructions (Signed)
Medication Instructions:  Your physician recommends that you continue on your current medications as directed. Please refer to the Current Medication list given to you today.  Labwork: None ordered  Testing/Procedures: None ordered  Follow-Up: Remote monitoring is used to monitor your Pacemaker or ICD from home. This monitoring reduces the number of office visits required to check your device to one time per year. It allows [place] to keep an eye on the functioning of your device to ensure it is working properly. You are scheduled for a device check from home on 01/13/2018. You may send your transmission at any time that day. If you have a wireless device, the transmission will be sent automatically. After your physician reviews your transmission, you will receive a postcard with your next transmission date.  Your physician wants you to follow-up in: 9 months with Dr. Curt Bears.  You will receive a reminder letter in the mail two months in advance. If you don't receive a letter, please call our office to schedule the follow-up appointment.  --- If you need a refill on your cardiac medications before your next appointment, please call your pharmacy. ---  Thank you for choosing [facility]!!   Trinidad Curet, RN [phone]  Any Other Special Instructions Will Be Listed Below (If Applicable).

## 2017-10-17 DIAGNOSIS — L97222 Non-pressure chronic ulcer of left calf with fat layer exposed: Secondary | ICD-10-CM | POA: Diagnosis not present

## 2017-10-17 DIAGNOSIS — I5041 Acute combined systolic (congestive) and diastolic (congestive) heart failure: Secondary | ICD-10-CM | POA: Diagnosis not present

## 2017-10-17 DIAGNOSIS — N183 Chronic kidney disease, stage 3 (moderate): Secondary | ICD-10-CM | POA: Diagnosis not present

## 2017-10-17 DIAGNOSIS — I130 Hypertensive heart and chronic kidney disease with heart failure and stage 1 through stage 4 chronic kidney disease, or unspecified chronic kidney disease: Secondary | ICD-10-CM | POA: Diagnosis not present

## 2017-10-17 DIAGNOSIS — L97822 Non-pressure chronic ulcer of other part of left lower leg with fat layer exposed: Secondary | ICD-10-CM | POA: Diagnosis not present

## 2017-10-17 DIAGNOSIS — I872 Venous insufficiency (chronic) (peripheral): Secondary | ICD-10-CM | POA: Diagnosis not present

## 2017-10-18 DIAGNOSIS — Z23 Encounter for immunization: Secondary | ICD-10-CM | POA: Diagnosis not present

## 2017-10-18 DIAGNOSIS — I5022 Chronic systolic (congestive) heart failure: Secondary | ICD-10-CM | POA: Diagnosis not present

## 2017-10-18 DIAGNOSIS — I10 Essential (primary) hypertension: Secondary | ICD-10-CM | POA: Diagnosis not present

## 2017-10-18 DIAGNOSIS — L97904 Non-pressure chronic ulcer of unspecified part of unspecified lower leg with necrosis of bone: Secondary | ICD-10-CM | POA: Diagnosis not present

## 2017-10-21 ENCOUNTER — Other Ambulatory Visit: Payer: Self-pay | Admitting: *Deleted

## 2017-10-21 NOTE — Patient Outreach (Signed)
[facility]) Care Management  10/21/2017  [place] [birth year] [id number]  Kari [name] is a 56 year old female patient with a hx of hypertension, sarcoidosis, hyperlipidemia, mixed CHF with most recent echocardiogram revealing EF of 40%-45%, nonischemic cardiomyopathy, history of V. fib arrest requiring defibrillation, status post ICD implantation on 07/05/2017.   [name] admitted to the hospitalon 07/20/2017 with dehydration, AKI, and hypotension. Ms. Freehling has been admitted to the hospital 3 times in the last 6 months. After her hospitalization, I followed Kari [name] at home for transition of care services and she is now being followed telephonically with anticipated discharge from community services at the end of this month.    I reached out to Kari [name] again today by phone to follow up on her progress with lymphedema treatment, hypertension management, and home safety/falls. Kari [name] continues to have the benefit of HHRN, HHPT, &HHOT. At home, she is also stillparticipating in a lymphedema therapy program and utilizes an automated compression device twice daily for therapy.   Chronic Health Condition (lymphedema)- Kari [name] has lymphedema of the bilateral lower extremities. She continues to use an electronic compression device twice daily which she performs independently. Her home health RN is applying dressings to superficial skin lesions on bilateral lower extremities once weekly. Ms. Marcy changes her dressings on days her nurse does not come. Ms. Moder says feels her lymphedema has improved significantly and she is pleased with her progress.   Acute/Chronic Health Condition (hypertension)- KariButler has been checking her bp and recording daily. Her bp's have been consistently in the 100's-110's/70's and today she reports a bp "around 110's/70's" (she didn't have her notebook in front of her). She denies dizziness,  lightheadedness, or any other new or worsened symptom. Kari [name] is taking her medications as prescribed.   Home Safety/Fall Risk- [name] was having falls prior to her hospitalization and has had one fall since discharge to home from which she did not sustain an injury. Her bathroom is accessible and she has a shower chair and elevated toilet seat. As noted, Kari [name] is working with home health PT and OT weekly. We have reviewed fall risk reduction strategies again numerous times.   Plan: When I discussed transition to telephonic case management with Kari [name] she agreed but asked if I would wait until right before Thanksgiving because she wants me to help her plan for her family trip and felt that out history together would prove helpful.   I will reach out to Kari [name] again in 2 weeks to follow up.    [facility]  [phone]

## 2017-10-22 ENCOUNTER — Telehealth: Payer: Self-pay | Admitting: Internal Medicine

## 2017-10-22 DIAGNOSIS — I5041 Acute combined systolic (congestive) and diastolic (congestive) heart failure: Secondary | ICD-10-CM | POA: Diagnosis not present

## 2017-10-22 DIAGNOSIS — L97822 Non-pressure chronic ulcer of other part of left lower leg with fat layer exposed: Secondary | ICD-10-CM | POA: Diagnosis not present

## 2017-10-22 DIAGNOSIS — N183 Chronic kidney disease, stage 3 (moderate): Secondary | ICD-10-CM | POA: Diagnosis not present

## 2017-10-22 DIAGNOSIS — I872 Venous insufficiency (chronic) (peripheral): Secondary | ICD-10-CM | POA: Diagnosis not present

## 2017-10-22 DIAGNOSIS — I130 Hypertensive heart and chronic kidney disease with heart failure and stage 1 through stage 4 chronic kidney disease, or unspecified chronic kidney disease: Secondary | ICD-10-CM | POA: Diagnosis not present

## 2017-10-22 DIAGNOSIS — L97222 Non-pressure chronic ulcer of left calf with fat layer exposed: Secondary | ICD-10-CM | POA: Diagnosis not present

## 2017-10-22 NOTE — Telephone Encounter (Signed)
Spoke with Kari [name] [facility]  Who states that Pt has increased LE swelling. Left leg is larger that right. Pt denies SOB. Kari [name] reports that home scale shows wt. Of 253 lbs. At last office visit with Dr. Curt Bears  pt was 288 lbs. Please advise.

## 2017-10-22 NOTE — Telephone Encounter (Signed)
New message    Call from [place] [facility] [place] [phone], wants to know how much did patient weigh on last discharge. Kathlee Nations unsure the scale at patients home is accurate. States patient left leg swollen. Wound on left leg. Please call   Pt c/o swelling: STAT is pt has developed SOB within 24 hours  1) How much weight have you gained and in what time span? n/a  2) If swelling, where is the swelling located? Left leg  3) Are you currently taking a fluid pill? Yes, 20mg  Lasix  4) Are you currently SOB? no  5) Do you have a log of your daily weights (if so, list)? Today- 253lbs  6) Have you gained 3 pounds in a day or 5 pounds in a week? unsure  7) Have you traveled recently? no

## 2017-10-22 NOTE — Telephone Encounter (Signed)
Please check that weight again. Drop from 288 lbs to 253 lbs in one week with more edema?  Need clarification

## 2017-10-23 NOTE — Telephone Encounter (Signed)
Spoke with pt who reports that wt. On home scale today is 272lb., Tue. 262 lbs., Mon 262 lbs, Sun 260, Sat 262. Pt states that they feel fine other than leg swelling.

## 2017-10-24 ENCOUNTER — Other Ambulatory Visit: Payer: Self-pay | Admitting: *Deleted

## 2017-10-24 DIAGNOSIS — F329 Major depressive disorder, single episode, unspecified: Secondary | ICD-10-CM | POA: Diagnosis not present

## 2017-10-24 DIAGNOSIS — I130 Hypertensive heart and chronic kidney disease with heart failure and stage 1 through stage 4 chronic kidney disease, or unspecified chronic kidney disease: Secondary | ICD-10-CM | POA: Diagnosis not present

## 2017-10-24 DIAGNOSIS — L97822 Non-pressure chronic ulcer of other part of left lower leg with fat layer exposed: Secondary | ICD-10-CM | POA: Diagnosis not present

## 2017-10-24 DIAGNOSIS — N183 Chronic kidney disease, stage 3 (moderate): Secondary | ICD-10-CM | POA: Diagnosis not present

## 2017-10-24 DIAGNOSIS — Z8674 Personal history of sudden cardiac arrest: Secondary | ICD-10-CM | POA: Diagnosis not present

## 2017-10-24 DIAGNOSIS — I872 Venous insufficiency (chronic) (peripheral): Secondary | ICD-10-CM | POA: Diagnosis not present

## 2017-10-24 DIAGNOSIS — Z4800 Encounter for change or removal of nonsurgical wound dressing: Secondary | ICD-10-CM | POA: Diagnosis not present

## 2017-10-24 DIAGNOSIS — J45909 Unspecified asthma, uncomplicated: Secondary | ICD-10-CM | POA: Diagnosis not present

## 2017-10-24 DIAGNOSIS — I5041 Acute combined systolic (congestive) and diastolic (congestive) heart failure: Secondary | ICD-10-CM | POA: Diagnosis not present

## 2017-10-24 DIAGNOSIS — D869 Sarcoidosis, unspecified: Secondary | ICD-10-CM | POA: Diagnosis not present

## 2017-10-24 DIAGNOSIS — L97222 Non-pressure chronic ulcer of left calf with fat layer exposed: Secondary | ICD-10-CM | POA: Diagnosis not present

## 2017-10-24 DIAGNOSIS — E876 Hypokalemia: Secondary | ICD-10-CM | POA: Diagnosis not present

## 2017-10-24 DIAGNOSIS — N390 Urinary tract infection, site not specified: Secondary | ICD-10-CM | POA: Diagnosis not present

## 2017-10-24 NOTE — Telephone Encounter (Signed)
Spoke with pt. Instructions given. Pt voiced understanding.

## 2017-10-24 NOTE — Telephone Encounter (Signed)
Please have her increase her lasix from 20 mg daily to 40 mg daily for two days, and increase potassium from 10 mEq daily to 20 mEq daily for the days she increases the lasix. Then return to normal doses. If she does not lose at least 5 lbs have her call us back.

## 2017-10-24 NOTE — Patient Outreach (Signed)
[facility]) Care Management  10/24/2017  ARIANAH TORGESON 05/24/1961 [id number]  Call received from Ms. Prim to report that her home health nurse noted increased swelling and mild weeping of bilateral lower extremities and called [name]'s provider who prescribed additional diuretic and potassium replacement which she is to take over the next 3 days. She says her weight was up 5# and she is to weigh daily and call her provider office on Monday to report her weight and the condition of her legs.   I reviewed signs and symptoms of worsening peripheral edema and/or worsening lymphedema and that she should call earlier than Monday if she noted worsening symptoms.   Plan: I will follow up Kari [name] by phone on Monday.    [facility]  [phone]

## 2017-10-28 ENCOUNTER — Other Ambulatory Visit: Payer: Self-pay | Admitting: *Deleted

## 2017-10-28 NOTE — Patient Outreach (Signed)
[facility]) Care Management  10/28/2017  HADYN AZER [birth year] [id number]  On Friday, I received from Ms. Difabio who reported that her home health nurse noted increased swelling and mild weeping of bilateral lower extremities and called Mrs. Nudd's provider who prescribed additional diuretic and potassium replacement which she was to take over the weekend. Mrs. Kittell's weight was up to 272lb (Friday 10/24/17). Mrs. Boehning said she has taken extra diuretic and potassium replacement over the last 2 days. Her weight today was 268lb. She says her legs are still swollen although "a little better" but she voiced concerns about how she expected them to look better today than they did. She is still elevating her legs and using her compression device. She anticipates a visit from her home health nurse on Wednesday and is to call me if her [facility] is unable to visit by then. I can see her at home on Thursday if needed.   I reviewed the instructions provided by her cardiology provider and told her that I would update Dr. Purcell Nails today on her weight and swelling.   Plan: I will follow up with Mrs. Ripberger by phone over the next 24 hours re: management of edema.    [facility]  [phone]

## 2017-10-29 ENCOUNTER — Other Ambulatory Visit: Payer: Self-pay | Admitting: *Deleted

## 2017-10-29 DIAGNOSIS — I130 Hypertensive heart and chronic kidney disease with heart failure and stage 1 through stage 4 chronic kidney disease, or unspecified chronic kidney disease: Secondary | ICD-10-CM | POA: Diagnosis not present

## 2017-10-29 DIAGNOSIS — I5041 Acute combined systolic (congestive) and diastolic (congestive) heart failure: Secondary | ICD-10-CM | POA: Diagnosis not present

## 2017-10-29 DIAGNOSIS — N183 Chronic kidney disease, stage 3 (moderate): Secondary | ICD-10-CM | POA: Diagnosis not present

## 2017-10-29 DIAGNOSIS — L97822 Non-pressure chronic ulcer of other part of left lower leg with fat layer exposed: Secondary | ICD-10-CM | POA: Diagnosis not present

## 2017-10-29 DIAGNOSIS — I872 Venous insufficiency (chronic) (peripheral): Secondary | ICD-10-CM | POA: Diagnosis not present

## 2017-10-29 DIAGNOSIS — L97222 Non-pressure chronic ulcer of left calf with fat layer exposed: Secondary | ICD-10-CM | POA: Diagnosis not present

## 2017-10-29 NOTE — Patient Outreach (Signed)
[facility]) Care Management  10/29/2017  LORENE KLIMAS 09/03/1961 [id number]  On Friday, I received from Ms. Thurlow who reported that her home health nurse noted increased swelling and mild weeping of bilateral lower extremities and called Mrs. Steinhoff's provider who prescribed additional diuretic and potassium replacement which she was to take over the weekend. Mrs. Vanasten's weight was up to 272lb (Friday 10/24/17). Mrs. Dearmas said she has taken extra diuretic and potassium replacement over the last 2 days. Her weight yesterday 10/28/17 was 268lb. Yesterday, she said her legs were still swollen although "a little better". She was still elevating her legs and using her compression device.   I returned a call to Mrs. Pleitez today to follow up on her weight and peripheral edema/swelling. Her weight today is 268lb (unchanged overnight) She has continued taking Lasix 20mg  BID and Potassium Chloride SA 27meq BID. Ms. Tesch says her legs are still swollen and she is concerned about this.   Weight:  10/24/17: 272lb > additional diuretic and potassium 10/28/17: 268lb 10/29/17: 268lb  Plan: I have notified [name] of Ms. Zellner's status/findings and have scheduled a home visit on Thursday.   I will follow up with Ms Boesen with any instructions from [name].    [facility]  [phone]

## 2017-10-30 ENCOUNTER — Other Ambulatory Visit (HOSPITAL_COMMUNITY)
Admission: RE | Admit: 2017-10-30 | Discharge: 2017-10-30 | Disposition: A | Discharge status: Home / Self Care | Payer: Medicare Other | Source: Ambulatory Visit | Attending: Adult Health | Admitting: Adult Health

## 2017-10-30 ENCOUNTER — Telehealth: Payer: Self-pay | Admitting: *Deleted

## 2017-10-30 ENCOUNTER — Ambulatory Visit (INDEPENDENT_AMBULATORY_CARE_PROVIDER_SITE_OTHER): Payer: Medicare Other | Admitting: Student

## 2017-10-30 ENCOUNTER — Encounter: Payer: Self-pay | Admitting: Student

## 2017-10-30 VITALS — BP 114/66 | HR 72 | Ht 69.0 in | Wt 299.0 lb

## 2017-10-30 DIAGNOSIS — I5041 Acute combined systolic (congestive) and diastolic (congestive) heart failure: Secondary | ICD-10-CM | POA: Diagnosis not present

## 2017-10-30 DIAGNOSIS — I5042 Chronic combined systolic (congestive) and diastolic (congestive) heart failure: Secondary | ICD-10-CM

## 2017-10-30 DIAGNOSIS — I10 Essential (primary) hypertension: Secondary | ICD-10-CM | POA: Diagnosis not present

## 2017-10-30 DIAGNOSIS — R609 Edema, unspecified: Secondary | ICD-10-CM

## 2017-10-30 DIAGNOSIS — I340 Nonrheumatic mitral (valve) insufficiency: Secondary | ICD-10-CM | POA: Insufficient documentation

## 2017-10-30 DIAGNOSIS — L97222 Non-pressure chronic ulcer of left calf with fat layer exposed: Secondary | ICD-10-CM | POA: Diagnosis not present

## 2017-10-30 DIAGNOSIS — I872 Venous insufficiency (chronic) (peripheral): Secondary | ICD-10-CM | POA: Diagnosis not present

## 2017-10-30 DIAGNOSIS — E8779 Other fluid overload: Secondary | ICD-10-CM

## 2017-10-30 DIAGNOSIS — N183 Chronic kidney disease, stage 3 (moderate): Secondary | ICD-10-CM | POA: Diagnosis not present

## 2017-10-30 DIAGNOSIS — I130 Hypertensive heart and chronic kidney disease with heart failure and stage 1 through stage 4 chronic kidney disease, or unspecified chronic kidney disease: Secondary | ICD-10-CM | POA: Diagnosis not present

## 2017-10-30 DIAGNOSIS — I428 Other cardiomyopathies: Secondary | ICD-10-CM | POA: Diagnosis not present

## 2017-10-30 DIAGNOSIS — L97822 Non-pressure chronic ulcer of other part of left lower leg with fat layer exposed: Secondary | ICD-10-CM | POA: Diagnosis not present

## 2017-10-30 LAB — BASIC METABOLIC PANEL
Anion gap: 9 (ref 5–15)
BUN: 22 mg/dL — ABNORMAL HIGH (ref 6–20)
CO2: 25 mmol/L (ref 22–32)
Calcium: 8.9 mg/dL (ref 8.9–10.3)
Chloride: 100 mmol/L — ABNORMAL LOW (ref 101–111)
Creatinine, Ser: 1.29 mg/dL — ABNORMAL HIGH (ref 0.44–1.00)
GFR calc Af Amer: 53 mL/min — ABNORMAL LOW (ref >60)
GFR calc non Af Amer: 46 mL/min — ABNORMAL LOW (ref >60)
Glucose, Bld: 109 mg/dL — ABNORMAL HIGH (ref 65–99)
Potassium: 4 mmol/L (ref 3.5–5.1)
Sodium: 134 mmol/L — ABNORMAL LOW (ref 135–145)

## 2017-10-30 LAB — CBC WITH DIFFERENTIAL/PLATELET
Basophils Absolute: 0 10*3/uL (ref 0.0–0.1)
Basophils Relative: 0 %
Eosinophils Absolute: 0.3 10*3/uL (ref 0.0–0.7)
Eosinophils Relative: 5 %
HCT: 34.5 % — ABNORMAL LOW (ref 36.0–46.0)
Hemoglobin: 10.5 g/dL — ABNORMAL LOW (ref 12.0–15.0)
Lymphocytes Relative: 24 %
Lymphs Abs: 1.4 10*3/uL (ref 0.7–4.0)
MCH: 27.5 pg (ref 26.0–34.0)
MCHC: 30.4 g/dL (ref 30.0–36.0)
MCV: 90.3 fL (ref 78.0–100.0)
Monocytes Absolute: 0.5 10*3/uL (ref 0.1–1.0)
Monocytes Relative: 9 %
Neutro Abs: 3.6 10*3/uL (ref 1.7–7.7)
Neutrophils Relative %: 62 %
Platelets: 203 10*3/uL (ref 150–400)
RBC: 3.82 MIL/uL — ABNORMAL LOW (ref 3.87–5.11)
RDW: 15.4 % (ref 11.5–15.5)
WBC: 5.7 10*3/uL (ref 4.0–10.5)

## 2017-10-30 LAB — BRAIN NATRIURETIC PEPTIDE: B Natriuretic Peptide: 98 pg/mL (ref 0.0–100.0)

## 2017-10-30 MED ORDER — FUROSEMIDE 40 MG PO TABS: 40.0000 mg | ORAL_TABLET | Freq: Every day | ORAL | 6 refills | Status: DC

## 2017-10-30 MED ORDER — POTASSIUM CHLORIDE CRYS ER 20 MEQ PO TBCR: 40.0000 meq | EXTENDED_RELEASE_TABLET | Freq: Every day | ORAL | 6 refills | Status: DC

## 2017-10-30 NOTE — Progress Notes (Signed)
Cardiology Office Note    Date:  10/30/2017   ID:  Kari [name], DOB Nov 19, 1961, MRN [id number]  PCP:  Rosita Fire, MD  Cardiologist: Dr. Harrington Challenger   Chief Complaint  Patient presents with  . Follow-up    Worsening Edema    History of Present Illness:    OSA Kari [name] is a 56 y.o. 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   Home Health called on 10/22/2017 reporting that the patient was having worsening lower extremity edema, however weight had declined on her home scales. Her Lasix was increased from 20 mg daily to 40 mg daily for 2 days with instructions to resume her baseline dosing after this.  In talking with the patient today, she reports worsening lower extremity edema for the past several weeks. She is being followed by Home Health and Unna boots have been placed as she had noted weeping along her legs bilaterally. She does weigh at home and reports weight has gone up from 260 lbs to 275 lbs over the past few weeks. At the time of her last office visit, weight was 288 lbs and this is elevated at 299 today.  She denies any associated dyspnea on exertion, orthopnea, PND, chest pain, or palpitations. She does not check BP regularly but this is stable at 114/66 during today's visit.    Past Medical History:  Diagnosis Date  . Asthma   . CHF (congestive heart failure) ([place]) 03/12/2014   a. EF 40-45% by echo in 06/2017 with normal cors by cath. ICD placed following VT arrest  . Depression   . Headache(784.0)   . Hyperlipidemia   . Hypertension   . Lung nodules   . Renal disorder   . Sarcoidosis   . Ulcer    left posterior calf    Past Surgical History:  Procedure Laterality Date  . cataract surgery Left  07-2013  . NO PAST SURGERIES      Current Medications: Outpatient Medications Prior to Visit  Medication Sig Dispense Refill  . albuterol (PROVENTIL HFA;VENTOLIN HFA) 108 (90 Base) MCG/ACT inhaler Inhale 2 puffs into the lungs every 6 (six) hours as needed for shortness of breath. 18 g 0  . aspirin EC 81 MG tablet Take 81 mg by mouth daily.    . carvedilol (COREG) 12.5 MG tablet Take 1 tablet (12.5 mg total) by mouth 2 (two) times daily with a meal. 180 tablet 3  . collagenase (SANTYL) ointment Apply 1 application topically as needed (for wound care). Apply to wound BLE per Tx 15 g 0  . lisinopril (PRINIVIL,ZESTRIL) 2.5 MG tablet Take 1 tablet (2.5 mg total) by mouth daily. 90 tablet 3  . loratadine (CLARITIN) 10 MG tablet Take 10 mg by mouth daily.    . nitrofurantoin, macrocrystal-monohydrate, (MACROBID) 100 MG capsule Take 100 mg by mouth 2 (two) times daily. Take 1 capsule by mouth twice daily for 5 days (started 08/08/17)    . ondansetron (ZOFRAN) 4 MG tablet Take 1 tablet (4 mg total) by mouth every 6 (six) hours as needed for nausea. 20 tablet 0  . furosemide (LASIX) 20 MG tablet Take 1 tablet (20 mg total) by mouth daily. 30 tablet 3  . potassium chloride SA (  K-DUR,KLOR-CON) 20 MEQ tablet Take 1 tablet (20 mEq total) by mouth daily. 90 tablet 3   No facility-administered medications prior to visit.      Allergies:   Patient has no known allergies.   Social History   Socioeconomic History  . Marital status: Single    Spouse name: None  . Number of children: 1  . Years of education: None  . Highest education level: None  Social Needs  . Financial resource strain: None  . Food insecurity - worry: None  . Food insecurity - inability: None  . Transportation needs - medical: None  . Transportation needs - non-medical: None  Occupational History  . None  Tobacco Use  . Smoking status: Never Smoker  . Smokeless tobacco: Never Used  Substance and Sexual Activity  . Alcohol  use: No  . Drug use: No  . Sexual activity: Not Currently  Other Topics Concern  . None  Social History Narrative   Lives alone.     Family History:  The patient's family history includes Cancer in her mother; Diabetes Mellitus II in her father and mother; Heart disease in her father; Heart failure in her mother; Hypertension in her mother; Stroke in her father.   Review of Systems:   Please see the history of present illness.     General:  No chills, fever, night sweats or weight changes.  Cardiovascular:  No chest pain, dyspnea on exertion, orthopnea, palpitations, paroxysmal nocturnal dyspnea. Positive for lower extremity edema.  Dermatological: No rash, lesions/masses Respiratory: No cough, dyspnea Urologic: No hematuria, dysuria Abdominal:   No nausea, vomiting, diarrhea, bright red blood per rectum, melena, or hematemesis Neurologic:  No visual changes, wkns, changes in mental status.  All other systems reviewed and are otherwise negative except as noted above.   Physical Exam:    VS:  BP 114/66   Pulse 72   Ht 5\' 9"  (1.753 m)   Wt 299 lb (135.6 kg)   LMP 11/21/2011   SpO2 98%   BMI 44.15 kg/m    General: Well developed, well nourished [place] female appearing in no acute distress. Head: Normocephalic, atraumatic, sclera non-icteric, no xanthomas, nares are without discharge.  Neck: No carotid bruits. JVD not elevated.  Lungs: Respirations regular and unlabored, without wheezes or rales.  Heart: Regular rate and rhythm. No S3 or S4.  No rubs or gallops appreciated. 2/6 SEM along Apex. Abdomen: Soft, non-tender, non-distended with normoactive bowel sounds. No hepatomegaly. No rebound/guarding. No obvious abdominal masses. Msk:  Strength and tone appear normal for age. No joint deformities or effusions. Extremities: No clubbing or cyanosis. 2+ pitting edema bilaterally. Legs wrapped.  Distal pedal pulses are 2+ bilaterally. Neuro: Alert and oriented X 3. Moves  all extremities spontaneously. No focal deficits noted. Psych:  Responds to questions appropriately with a normal affect. Skin: No rashes or lesions noted  Wt Readings from Last 3 Encounters:  10/30/17 299 lb (135.6 kg)  10/28/17 268 lb (121.6 kg)  10/14/17 288 lb (130.6 kg)      Studies/Labs Reviewed:   EKG:  EKG is not ordered today.    Recent Labs: 06/26/2017: TSH 9.201 07/01/2017: Magnesium 2.4 07/19/2017: ALT 14 10/30/2017: B Natriuretic Peptide 98.0; BUN 22; Creatinine, Ser 1.29; Hemoglobin 10.5; Platelets 203; Potassium 4.0; Sodium 134   Lipid Panel    Component Value Date/Time   CHOL 135 06/21/2012 0950   TRIG 59 06/21/2012 0950   HDL 54 06/21/2012 0950   CHOLHDL 2.5  06/21/2012 0950   VLDL 12 06/21/2012 0950   LDLCALC 69 06/21/2012 0950    Additional studies/ records that were reviewed today include:   Cardiac Catheterization: 06/2017  Left dominant coronary anatomy.  Normal coronary arteries.  Mild to moderate global left ventricular hypo-kinesis with estimated EF 35-40%. LV end-diastolic pressure is up and normal. Combined systolic and diastolic heart failure, chronic  Recommendations:  Further management per treating team including CAD including guideline directed heart failure therapy  EP evaluation for possible AICD.   Echocardiogram: 06/2017 Study Conclusions  - Left ventricle: Diffuse hypokinesis worse in the inferior basal   wall The cavity size was moderately dilated. Wall thickness was   increased in a pattern of moderate LVH. Systolic function was   mildly to moderately reduced. The estimated ejection fraction was   in the range of 40% to 45%. Left ventricular diastolic function   parameters were normal. - Aortic valve: There was mild regurgitation. - Mitral valve: There was moderate regurgitation. - Left atrium: The atrium was moderately dilated. - Atrial septum: No defect or patent foramen ovale was identified.  Assessment:    1.  Chronic combined systolic (congestive) and diastolic (congestive) heart failure ([place])   2. Nonischemic cardiomyopathy ([place])   3. Essential hypertension   4. Mitral valve insufficiency, unspecified etiology      Plan:   In order of problems listed above:  1. Chronic Combined Systolic and Diastolic CHF - the patient has a known reduced EF of 40-45%. She has experienced worsening edema over the past two weeks and wight has trended up from 288 lbs to 299 lbs during this time frame. She denies any worsening of her respiratory status.  - on examination, she does have 2+ pitting edema but lungs are clear. Will check CBC, BMET, and BNP as already ordered.  - she currently takes Lasix 20mg  daily. Will increase to 40mg  daily (with adjustment of her K+ dosing to 20 mEq BID) and arrange for close follow-up in the next 2-3 weeks.  - continue Coreg 12.5mg  BID and Lisinopril 2.5mg  daily.   2. Nonischemic Cardiomyopathy - the patient suffered a VT arrest in 06/2017 with catheterization at that time showing normal cors. She underwent placement of a St. Jude ICD which is followed by Dr. Curt Bears.  - continue BB and ACE-I.   3. HTN - BP is well-controlled at 114/66 during today's visit. - continue current medication regimen.   4. Mitral Regurgitation - moderate by most recent echo. Continue to follow.    Medication Adjustments/Labs and Tests Ordered: Current medicines are reviewed at length with the patient today.  Concerns regarding medicines are outlined above.  Medication changes, Labs and Tests ordered today are listed in the Patient Instructions below. Patient Instructions  Medication Instructions:  Your physician has recommended you make the following change in your medication:  Increase Lasix to 40 mg Daily  Increase Potassium 40 mEq Daily   Labwork: Your physician recommends that you return for lab work in: Today   Testing/Procedures: NONE   Follow-Up: Your physician recommends that  you schedule a follow-up appointment in: 3 Weeks   Any Other Special Instructions Will Be Listed Below (If Applicable).  Thank you for choosing [facility]!    If you need a refill on your cardiac medications before your next appointment, please call your pharmacy.   Signed, Erma Heritage, PA-C  10/30/2017 7:39 PM    [address], [address], [place]  [address] Phone: [phone]; Fax: [phone]  [address] [place], [place] [address] Phone: [phone]

## 2017-10-30 NOTE — Patient Instructions (Signed)
Medication Instructions:  Your physician has recommended you make the following change in your medication:  Increase Lasix to 40 mg Daily  Increase Potassium 40 mEq Daily    Labwork: Your physician recommends that you return for lab work in: Today    Testing/Procedures: NONE   Follow-Up: Your physician recommends that you schedule a follow-up appointment in: 3 Weeks    Any Other Special Instructions Will Be Listed Below (If Applicable).  Thank you for choosing [facility]!     If you need a refill on your cardiac medications before your next appointment, please call your pharmacy.

## 2017-10-30 NOTE — Telephone Encounter (Signed)
-----   Message from Lendon Colonel, NP sent at 10/29/2017 12:22 PM EST ----- Regarding: Fluid overload Kari [name] sent me a message stating that this patient is not getting any better with increased dose of diuretics. Can we please get some labs on her?   CBC, BNP, and CBC. She will need to be seen. Can she get in tomorrow?

## 2017-10-31 ENCOUNTER — Other Ambulatory Visit: Payer: Self-pay | Admitting: *Deleted

## 2017-10-31 NOTE — Patient Outreach (Signed)
[facility]) Care Management  10/31/2017  Kari [name] March 18, 1961 [id number]  Kari [name] was not at home today when I arrived for our scheduled 11am appointment. I believe there may have been miscommunication as she has historically been adherent to scheduled visits.   I was able to collaborate with Bernerd Pho, [place] at [facility] re: need for ongoing twice weekly [facility] visits given that Kari [name]'s volume status and lymphedema are currently unstable.  Plan: I left a HIPPA compliant voice message with Kari [name] and will return a call to her in follow up no later than early next week.    [place] [facility]  [phone]

## 2017-11-01 ENCOUNTER — Other Ambulatory Visit: Payer: Self-pay | Admitting: Student

## 2017-11-01 DIAGNOSIS — I5031 Acute diastolic (congestive) heart failure: Secondary | ICD-10-CM

## 2017-11-05 ENCOUNTER — Other Ambulatory Visit: Payer: Self-pay | Admitting: *Deleted

## 2017-11-05 NOTE — Patient Outreach (Signed)
[facility]) Care [name]  11/05/2017  Kari [name] [birth year] [id number]  I received a call from Kari [name] re: her understanding from the cardiology provider office that her home health nursing visits would increase to 2-3 times weekly during the increased assessment and treatment needs surrounding volume [name] and lymphedema treatment. I reached out to the [place] branch office of [facility] and spoke with the front desk representative who stated she'd also spoken with Kari [name]. The Case Manager at [facility] for Kari [name]'s case is out of the office for the remainder of this hour but is securing needed orders according to the office rep.   I returned a call to Kari [name] to update her on the progress as outlined above. She expressed her appreciation and said that she was having quite a bit of pain in her left leg today and was planning to take an extra strength Tylenol if it didn't start to feel better soon.   Plan: I have scheduled a face to face visit with Kari [name] for tomorrow regardless of whether her [facility] is scheduled to visit or not. Given Kari [name]'s recent medical history and complex needs, I believe she would benefit from stringent oversight and additional assessment.   [facility] CM Care Plan Problem One     Most Recent Value  Care Plan Problem One  Knowledge Deficits re: [name] of chronic health [name] during holiday travel  Role Documenting the Problem One  Care [name] [place] for Problem One  Active  THN Long Term Goal   Over the next 31 days, patient will work with [facility] to establish a self health [name] travel plan for holidays  THN Long Term Goal Start Date  10/21/17  Interventions for Problem One Long Term Goal  not discussed today,  addressing volume status and pain [name] needs  THN CM Short Term Goal #1   Over the next 14 days, patient will begin written self heatlh mangaement  plan for travel which includes action plan for emergencies  THN CM Short Term Goal #1 Start Date  10/21/17  Interventions for Short Term Goal #1  not discussed,  addressing pain [name] and volume status needs  THN CM Short Term Goal #2   Over the next 14 days, patient will verbalize establishment of plan for medicaiton [name] during travel   [facility] CM Short Term Goal #2 Start Date  10/21/17  Interventions for Short Term Goal #2  not discussed,  addressing volume [name] and pain [name] needs  THN CM Short Term Goal #3  Over the next 14 days, patient will establish writen action plan for self health monitorin during travel   [facility] CM Short Term Goal #3 Start Date  10/21/17  Interventions for Short Tern Goal #3  not discussed,  addressing volume and pain managmeent needs    [facility] CM Care Plan Problem Two     Most Recent Value  Care Plan Problem Two  Volume [name] Concerns (Peripheral Edema/Swelling of bilareral LE)   Role Documenting the Problem Two  Care [name] [place] for Problem Two  Active  Interventions for Problem Two Long Term Goal   telephone assessment,  update to provider office,  collaboration with home health agency/nurse  [name] Long Term Goal  Over the next 31 days, patient will verbalize improved condition of volume status  THN Long Term Goal Start Date  10/24/17  [facility] CM Short Term Goal #1  Over the next 30 days, patient will weigh daily, record, and notify provider for weight gain of 3# overnight or 5# in a week  THN CM Short Term Goal #1 Start Date  10/24/17  Interventions for Short Term Goal #2   confirmed ongoing daily weights  THN CM Short Term Goal #2   Over the next 30 days, patient will rerport sigsn and syptoms of worsening peripheral edema/lymphedema  THN CM Short Term Goal #2 Start Date  10/24/17  Interventions for Short Term Goal #2  telephone assessment,  update to provider office,  scheduled home visit for tomorrow      Kari [name]  Kari [name]  [phone]

## 2017-11-06 ENCOUNTER — Other Ambulatory Visit: Payer: Self-pay | Admitting: *Deleted

## 2017-11-06 DIAGNOSIS — I5041 Acute combined systolic (congestive) and diastolic (congestive) heart failure: Secondary | ICD-10-CM | POA: Diagnosis not present

## 2017-11-06 DIAGNOSIS — L97222 Non-pressure chronic ulcer of left calf with fat layer exposed: Secondary | ICD-10-CM | POA: Diagnosis not present

## 2017-11-06 DIAGNOSIS — I130 Hypertensive heart and chronic kidney disease with heart failure and stage 1 through stage 4 chronic kidney disease, or unspecified chronic kidney disease: Secondary | ICD-10-CM | POA: Diagnosis not present

## 2017-11-06 DIAGNOSIS — L97822 Non-pressure chronic ulcer of other part of left lower leg with fat layer exposed: Secondary | ICD-10-CM | POA: Diagnosis not present

## 2017-11-06 DIAGNOSIS — N183 Chronic kidney disease, stage 3 (moderate): Secondary | ICD-10-CM | POA: Diagnosis not present

## 2017-11-06 DIAGNOSIS — I872 Venous insufficiency (chronic) (peripheral): Secondary | ICD-10-CM | POA: Diagnosis not present

## 2017-11-06 NOTE — Patient Outreach (Signed)
[facility]) Care Management   11/06/2017  Donnice ALANNA STORTI Nov 18, 1961 [id number]  DARLEN GLEDHILL is an 56 y.o. female DALILA ARCA is a 56 year old female patient with a hx of hypertension, sarcoidosis, hyperlipidemia, mixed CHF with most recent echocardiogram revealing EF of 40%-45%, nonischemic cardiomyopathy, history of V. fib arrest requiring defibrillation, status post ICD implantation on 07/05/2017.   [name] admitted to the hospitalon 07/20/2017 with dehydration, AKI, and hypotension. Ms. Bracher has been admitted to the hospital 3 times in the last 6 months. After her hospitalization, I followed Mrs. Amoroso at home for transition of care services and she progressed very well with the careful attention of home health and her own high level engagement in her own care. We were prepared to transition Mrs. Stegmaier to telephonic case management until this past week when she started having volume management problems and experienced worsening of her lower extremity lymphedema and skin wound on her left lower leg.   Mrs. Barriere has been receiving weekly dressing changes (Santyl > W>D) by her home health nurse once weekly and has been performing the daily dressing changes independently on days her nurse does not come. She also is able to manage her electronic compression device daily without difficulty.   I was asked to come see her today because of deterioration in the skin of her left lower extremity and met with the home health nurse at the home of Mrs. Russom.    Subjective: "Its getting worse and it really hurts bad"  Objective:  BP 100/60   Pulse 68   Temp (!) 96.6 F (35.9 C)   Wt 270 lb (122.5 kg)   LMP 11/21/2011   SpO2 98%   BMI 39.87 kg/m   Review of Systems  Constitutional: Positive for malaise/fatigue. Negative for chills and fever.  HENT: Negative.   Eyes: Negative.   Respiratory: Negative.  Negative for cough, sputum  production, shortness of breath and wheezing.   Cardiovascular: Positive for leg swelling.       2+ bilateral lower extremity edema knees to pre-tibial soft tissue  Gastrointestinal: Negative.   Genitourinary: Negative.   Musculoskeletal: Positive for myalgias. Negative for falls.  Skin:       See notes in physical exam section  Neurological: Negative.   Psychiatric/Behavioral: Negative.     Physical Exam  Constitutional: She is oriented to person, place, and time. She appears well-developed and well-nourished. She is active. She has a sickly appearance.  Cardiovascular: Normal rate and regular rhythm.  Respiratory: Effort normal and breath sounds normal. No respiratory distress. She has no wheezes. She has no rhonchi. She has no rales.  GI: Soft. Bowel sounds are normal. She exhibits no distension.  Neurological: She is alert and oriented to person, place, and time.  Skin:     See skin assessment notes  Psychiatric: She has a normal mood and affect. Her speech is normal and behavior is normal. Judgment and thought content normal. Cognition and memory are normal.    Encounter Medications:   Outpatient Encounter Medications as of 11/06/2017  Medication Sig  . albuterol (PROVENTIL HFA;VENTOLIN HFA) 108 (90 Base) MCG/ACT inhaler Inhale 2 puffs into the lungs every 6 (six) hours as needed for shortness of breath.  Marland Kitchen aspirin EC 81 MG tablet Take 81 mg by mouth daily.  . carvedilol (COREG) 12.5 MG tablet Take 1 tablet (12.5 mg total) by mouth 2 (two) times daily with a meal.  . collagenase (SANTYL) ointment  Apply 1 application topically as needed (for wound care). Apply to wound BLE per Tx  . furosemide (LASIX) 40 MG tablet Take 1 tablet (40 mg total) daily by mouth.  Marland Kitchen lisinopril (PRINIVIL,ZESTRIL) 2.5 MG tablet Take 1 tablet (2.5 mg total) by mouth daily.  Marland Kitchen loratadine (CLARITIN) 10 MG tablet Take 10 mg by mouth daily.  . nitrofurantoin, macrocrystal-monohydrate, (MACROBID) 100 MG  capsule Take 100 mg by mouth 2 (two) times daily. Take 1 capsule by mouth twice daily for 5 days (started 08/08/17)  . ondansetron (ZOFRAN) 4 MG tablet Take 1 tablet (4 mg total) by mouth every 6 (six) hours as needed for nausea.  . potassium chloride SA (KLOR-CON M20) 20 MEQ tablet Take 2 tablets (40 mEq total) daily by mouth.   No facility-administered encounter medications on file as of 11/06/2017.     Assessment:    Chronic Health Condition (lymphedema)- Ms. Nannini's skin has stage I skin tear to LLE approximately 7.5cm x 9cm (increased from 3cm x 3cm per [facility] records); the area is oozing serous fluid; both legs are edematous today from just below the knee to the pretibial soft tissue; HHRN applied Santyl ointment followed by a wet to dry dressing then kerlix and elastic bandage; Ms. Gonia reports that the leg is quite painful.   The [facility] reached out to the primary care provider to notify him of today's assessment and to request recommendations about any change to the treatment plan; I notified the cardiology team about her weight and swelling.  Ms. Bauza was advised by the cardiology team to maintain her increased dose of Lasix and Potassium replacement as ordered last week.   I reached out to the office of Dr. Legrand Rams re: Ms. Vital's increased pain and her request for something to help with the pain.    Ms. Cammack's weight today was 270lb (up 2lb from 168 on home scales) and her vital signs were otherwise stable with bp 100/60, pulse 68, temperature 96.6.    Plan: I will follow up with Dr. Legrand Rams re: pain management and ongoing needs related to lymphedema treatment.   Ms. Gens will continue to work with the home health team and will weigh herself daily and record.   I will reach out to Ms. Spieler in follow up this week and will plan to see her weekly until her volume and skin care needs are stable.   [place] CM Care Plan Problem One     Most Recent Value  Care Plan  Problem One  Knowledge Deficits re: management of chronic health management during holiday travel  Role Documenting the Problem One  Care Management [place] for Problem One  Active  THN Long Term Goal   Over the next 31 days, patient will work with [facility] to establish a self health management travel plan for holidays  THN Long Term Goal Start Date  10/21/17  Interventions for Problem One Long Term Goal  holding while dealing with volume/skin care needs  THN CM Short Term Goal #1   Over the next 14 days, patient will begin written self heatlh mangaement plan for travel which includes action plan for emergencies  THN CM Short Term Goal #1 Start Date  10/21/17  Interventions for Short Term Goal #1  holding while dealing with volume/skin care needs  THN CM Short Term Goal #2   Over the next 14 days, patient will verbalize establishment of plan for medicaiton management during travel   [facility] CM Short  Term Goal #2 Start Date  10/21/17  Interventions for Short Term Goal #2  holding while dealing with volume/skin care needs  THN CM Short Term Goal #3  Over the next 14 days, patient will establish writen action plan for self health monitorin during travel   [facility] CM Short Term Goal #3 Start Date  10/21/17  Interventions for Short Tern Goal #3  holding while dealing with voume/skin care needs    [facility] CM Care Plan Problem Two     Most Recent Value  Care Plan Problem Two  Volume Management Concerns (Peripheral Edema/Swelling of bilareral LE)   Role Documenting the Problem Two  Care Management Coordinator  Care Plan for Problem Two  Active  Interventions for Problem Two Long Term Goal   encouraged patient to continue with daily weights and recording,  collaboration with [facility] and cardiology team  [facility] Long Term Goal  Over the next 31 days, patient will verbalize improved condition of volume status  THN Long Term Goal Start Date  10/24/17  THN CM Short Term Goal #1   Over the next 30 days, patient  will weigh daily, record, and notify provider for weight gain of 3# overnight or 5# in a week  THN CM Short Term Goal #1 Start Date  10/24/17  Interventions for Short Term Goal #2   encouraged ongoing self monitoring  THN CM Short Term Goal #2   Over the next 30 days, patient will rerport sigsn and syptoms of worsening peripheral edema/lymphedema  THN CM Short Term Goal #2 Start Date  10/24/17  Interventions for Short Term Goal #2  face to face phyiscal assessment,  collaboraton with [facility] and PCP office      [place] Care Management  [phone]

## 2017-11-08 ENCOUNTER — Other Ambulatory Visit: Payer: Self-pay | Admitting: *Deleted

## 2017-11-08 NOTE — Patient Outreach (Signed)
[facility]) Care Management  11/08/2017  MARLAINE AREY 1961-12-11 [id number]  Skin Care/Pain Mangaement - I reached out to Ms. Biederman today to follow up on her skin care concerns. She states that she did not hear from her PCP office regarding patient management. I reached out to th eoffice of Dr. Legrand Rams today to update him on Ms. Peckham's current progress with her lymphedema/skin condition and pain management needs. Dr. Legrand Rams has faxed a prescription for Tramadol to [facility]. I returned a call to Ms. Yerby to notify her that her prescription is ready. She is going to send her nephew to pick it up for her. I reviewed precautions related to the use of Tramadol and reviewed again fall risk reduction strategies.   Home Health Follow Up - [name] anticipates her next visit from her [facility] early next week. She will perform her own dressing change today and over the weekend. She is to call me on Monday to let me know when her [facility] is to visit and I will coordinate a visit on a day her nurse is not coming so that I can further assess her skin and assist with her dressing change.   Plan: I will follow up with Ms. Finan early next week regarding her skin care and volume management needs.   [facility] CM Care Plan Problem     Most Recent Value  Care Plan Problem   Volume Management Concerns (Peripheral Edema/Swelling of bilareral LE)   Role Documenting the Problem   Care Management Coordinator  Care Plan for Problem   Active  Interventions for Problem  Long Term Goal   reviewed with patient symptoms re: leg/abdominal swelling/daily weights  THN Long Term Goal  Over the next 31 days, patient will verbalize improved condition of volume status  THN Long Term Goal Start Date  10/24/17  THN CM Short Term Goal #1   Over the next 30 days, patient will weigh daily, record, and notify provider for weight gain of 3# overnight or 5# in a week  THN CM Short Term Goal #1 Start  Date  10/24/17  Interventions for Short Term Goal #2   discussed ongoing need for daily weight and notification of cardioogy provider or [place] or RNCM for weight gain  THN CM Short Term Goal #2   Over the next 30 days, patient will rerport sigsn and syptoms of worsening peripheral edema/lymphedema  THN CM Short Term Goal #2 Start Date  10/24/17  Interventions for Short Term Goal #2  discussed with patient importance of notification of provider or [facility] or RNCM of new or worsened symptoms, as early as noted      [facility] Care Management  [phone]

## 2017-11-12 ENCOUNTER — Other Ambulatory Visit: Payer: Self-pay | Admitting: *Deleted

## 2017-11-12 ENCOUNTER — Telehealth: Payer: Self-pay | Admitting: Cardiology

## 2017-11-12 DIAGNOSIS — I872 Venous insufficiency (chronic) (peripheral): Secondary | ICD-10-CM | POA: Diagnosis not present

## 2017-11-12 DIAGNOSIS — I130 Hypertensive heart and chronic kidney disease with heart failure and stage 1 through stage 4 chronic kidney disease, or unspecified chronic kidney disease: Secondary | ICD-10-CM | POA: Diagnosis not present

## 2017-11-12 DIAGNOSIS — L97222 Non-pressure chronic ulcer of left calf with fat layer exposed: Secondary | ICD-10-CM | POA: Diagnosis not present

## 2017-11-12 DIAGNOSIS — I5041 Acute combined systolic (congestive) and diastolic (congestive) heart failure: Secondary | ICD-10-CM | POA: Diagnosis not present

## 2017-11-12 DIAGNOSIS — N183 Chronic kidney disease, stage 3 (moderate): Secondary | ICD-10-CM | POA: Diagnosis not present

## 2017-11-12 DIAGNOSIS — L97822 Non-pressure chronic ulcer of other part of left lower leg with fat layer exposed: Secondary | ICD-10-CM | POA: Diagnosis not present

## 2017-11-12 NOTE — Telephone Encounter (Signed)
Kari [name] ( [place] ) is calling because Kari [name] weighed 291 lbs today and when she saw her on 11/06/17 she weighed 270 lbs . She has no shortness of Breath and no dry cough . She has been taken a extra fluid pill a day ( Lasix ) . States that her legs has been cramping.  Wants to know what do . Please call . Thanks

## 2017-11-12 NOTE — Patient Outreach (Signed)
[facility]) Care Management  11/12/2017  MILAROSE SAVICH Nov 30, 1961 [id number]  Follow up with Ms. Royle by phone today re: leg wound/skin condition. She reports that her HHRN came today to perform a dressing change and noted a new skin opening on the right leg that was not previously present. In addition, the [facility] called Dr. Josephine Cables office to notify him that Ms. Kettlewell has had hallucinations in the past when taking Tramadol and wishes to have a different medication called in if he agrees.   Plan: I will see Ms. Mazurek at home tomorrow for a skin/wound assessment and dressing change and follow up of pain management strategy.   THN CM Care Plan Problem Two     Most Recent Value  Care Plan Problem Two  Volume Management Concerns (Peripheral Edema/Swelling of bilareral LE)   Role Documenting the Problem Two  Care Management Coordinator  Care Plan for Problem Two  Active  Interventions for Problem Two Long Term Goal   discussed weight, edema, treatment plan with patient  THN Long Term Goal  Over the next 31 days, patient will verbalize improved condition of volume status  THN Long Term Goal Start Date  10/24/17  THN CM Short Term Goal #1   Over the next 30 days, patient will weigh daily, record, and notify provider for weight gain of 3# overnight or 5# in a week  THN CM Short Term Goal #1 Start Date  10/24/17  Interventions for Short Term Goal #2   reviewed daily weight findings and symptoms with patient by phone  THN CM Short Term Goal #2   Over the next 30 days, patient will rerport sigsn and syptoms of worsening peripheral edema/lymphedema  THN CM Short Term Goal #2 Start Date  10/24/17  Interventions for Short Term Goal #2  reviewed patient symptom assessment/findings by phone,  scheduled home visit for 11/13/17  THN CM Short Term Goal #3   Over the next 30 days, patient will work with [facility] and [place] to structure visit schedule for the purpose of assistance with  wound assessment and dressinsg changes  THN CM Short Term Goal #3 Start Date  11/12/17  Interventions for Short Term Goal #3  discussed patient responsibility to communicate with [facility] and RNCM re: visit schedule,  scheduled home visit withu patient for 11/13/17    [facility] CM Care Plan Problem Three     Most Recent Value  Care Plan Problem Three  Pain Management Concerns  Role Documenting the Problem Three  Care Management Coordinator  Care Plan for Problem Three  Active  THN Long Term Goal   Over the next 31 days, patient will verbalize understanding of prescriber plan for management of short term pain   THN Long Term Goal Start Date  11/12/17  Interventions for Problem Three Long Term Goal  Discussed with patient current pain management strategy and medication management plan  THN CM Short Term Goal #1   Over the next 48 hours, patient will verbalize receipt of newly prescribed medication for pain management  Interventions for Short Term Goal #1  discusused with patient, HHRN intervention with PCP  THN CM Short Term Goal #2   Over the next 30 days, patient will verbalize and demonstrate understaning of prescribed use of pharmacological pain management intervention  THN CM Short Term Goal #2 Start Date  11/12/17  Interventions for Short Term Goal #2  discussed with patient plans to folllow up on prescribing instructions for pain medication  [place] Management  [phone]

## 2017-11-12 NOTE — Telephone Encounter (Signed)
Keep appt with me

## 2017-11-12 NOTE — Telephone Encounter (Signed)
This is a  patient.  I called patient to check on her. She is feeling good.  She was instructed to take 2 lasix and 2 potassium pills for 2 weeks.   She has done this for one week already.  She thinks her swelling is improving.   She has no SOB.  She states the nurse was concerned about her weight increase.   Advised patient that I am forwarding to Dr. Harrington Challenger and the office where she is seen for further recommendations and follow up.   Has appointment with Dr. Harrington Challenger on 11/18/17.

## 2017-11-12 NOTE — Telephone Encounter (Signed)
Forwarding to primary cardiologist and her nurse to address. Also forwarding to [place] PA being that she has seen the pt most recent.

## 2017-11-13 ENCOUNTER — Other Ambulatory Visit: Payer: Self-pay | Admitting: *Deleted

## 2017-11-13 NOTE — Patient Outreach (Signed)
[facility]) Care Management   11/13/2017  Rake 07/27/1961 [id number]  Kari [name] is an 56 y.o. female patient with a hx of hypertension, sarcoidosis, hyperlipidemia, mixed CHF with most recent echocardiogram revealing EF of 40%-45%, nonischemic cardiomyopathy, history of V. fib arrest requiring defibrillation, status post ICD implantation on 07/05/2017.   [name] admitted to the hospitalon 07/20/2017 with dehydration, AKI, and hypotension. Kari [name] has been admitted to the hospital 3 times in the last 6 months. After her hospitalization, I followed Kari [name] at home for transition of care services and she progressed very well with the careful attention of home health and her own high level engagement in her own care. We were prepared to transition Kari [name] to telephonic case management until she started having volume management problems and experienced worsening of her lower extremity lymphedema and skin wound on her left lower leg.   I am seeing Kari [name] at home today to follow up on volume management and skin care needs.  Subjective: "I can't believe my weight is up like this. Its not in my legs. I don't know where it is!"   Objective: BP 120/80   Pulse 71   Wt 295 lb (133.8 kg)   LMP 11/21/2011   SpO2 98%   BMI 43.56 kg/m    Review of Systems  Constitutional: Negative.  Negative for chills and fever.  HENT: Negative.   Eyes: Negative.   Respiratory: Negative.  Negative for cough, sputum production, shortness of breath and wheezing.   Cardiovascular: Positive for leg swelling. Negative for chest pain.  Gastrointestinal: Negative.   Genitourinary: Negative.   Musculoskeletal: Positive for myalgias. Negative for falls.  Skin:       See notation in physical exam for details re: lower extremity skin condition  Neurological: Negative.   Psychiatric/Behavioral: Negative.     Physical Exam  Constitutional: She is  oriented to person, place, and time. Vital signs are normal. She appears well-developed and well-nourished. She is active. She does not have a sickly appearance. She does not appear ill.  Cardiovascular: Normal rate and regular rhythm.  No murmur heard. Respiratory: Effort normal and breath sounds normal. She has no wheezes. She has no rhonchi. She has no rales.  GI: Soft. Bowel sounds are normal.  Neurological: She is alert and oriented to person, place, and time.  Skin: Skin is warm and dry.     Skin warm/dry with exception of lower extremities; see note re: skin  Psychiatric: She has a normal mood and affect. Her speech is normal and behavior is normal. Judgment and thought content normal. Cognition and memory are normal.    Encounter Medications:   Outpatient Encounter Medications as of 11/13/2017  Medication Sig  . albuterol (PROVENTIL HFA;VENTOLIN HFA) 108 (90 Base) MCG/ACT inhaler Inhale 2 puffs into the lungs every 6 (six) hours as needed for shortness of breath.  Marland Kitchen aspirin EC 81 MG tablet Take 81 mg by mouth daily.  . carvedilol (COREG) 12.5 MG tablet Take 1 tablet (12.5 mg total) by mouth 2 (two) times daily with a meal.  . collagenase (SANTYL) ointment Apply 1 application topically as needed (for wound care). Apply to wound BLE per Tx  . furosemide (LASIX) 40 MG tablet Take 1 tablet (40 mg total) daily by mouth.  Marland Kitchen lisinopril (PRINIVIL,ZESTRIL) 2.5 MG tablet Take 1 tablet (2.5 mg total) by mouth daily.  Marland Kitchen loratadine (CLARITIN) 10 MG tablet Take 10 mg by mouth daily.  Marland Kitchen  nitrofurantoin, macrocrystal-monohydrate, (MACROBID) 100 MG capsule Take 100 mg by mouth 2 (two) times daily. Take 1 capsule by mouth twice daily for 5 days (started 08/08/17)  . ondansetron (ZOFRAN) 4 MG tablet Take 1 tablet (4 mg total) by mouth every 6 (six) hours as needed for nausea.  . potassium chloride SA (KLOR-CON M20) 20 MEQ tablet Take 2 tablets (40 mEq total) daily by mouth.   Assessment:  57 year old  female with CHF, Sarcoidosis, and lower extremity edema/lymphedema with impaired skin integrity.   Lower Extremity Edema/Lymphedema with Impaired Skin Integrity - Kari [name] has been receiving twice weekly dressing changes (Santyl > W>D) by her home health nurse and has been performing the daily dressing changes independently on days her nurse does not come. She also is able to manage her electronic compression device daily without difficulty.   Left Leg 11/13/17:      Right Leg 11/13/17:      CHF/Volume Management - Kari [name]'s weight is up to 295lb on her home scales today (up from 268 on 10/29/17). She says she is "a little bit more worn out than usual" and somewhat more short of breath with activity. Her breath sounds are clear today although diminished in the bases. Her peripheral edema is drastically improved. She denies chest pain, nausea, dizziness, or lightheadedness. She is taking medications as prescribed - her cardiology provider increased her Lasix dose to 40mg  QD and Potassium Chloride SA 75meq QD. She has worked diligently on making changes to her diet so as to adhere to the prescribed low sodium/heart healthy diet. We discussed management of her oral fluid intake today as well. K[name] understanding of signs/symptoms of CHF for which she needs to contact her provider. She weighs herself daily and records. See notes below re: daily weights:   Weight:  11/13/17: 295lb 10/29/17: 268lb 10/28/17: 268lb 10/24/17: 272lb > additional diuretic and potassium  Plan:   [facility] CM Care Plan Problem Two     Most Recent Value  Care Plan Problem Two  Volume Management Concerns (Peripheral Edema/Swelling of bilareral LE)   Role Documenting the Problem Two  Care Management [place] for Problem Two  Active  Interventions for Problem Two Long Term Goal   reviewed plan of care for management of lower extremity swelling/edema  THN Long Term Goal  Over the next 31  days, patient will verbalize improved condition of volume status  THN Long Term Goal Start Date  10/24/17  THN CM Short Term Goal #1   Over the next 30 days, patient will weigh daily, record, and notify provider for weight gain of 3# overnight or 5# in a week  THN CM Short Term Goal #1 Start Date  10/24/17  Interventions for Short Term Goal #2   reviewed patient's daily weight chart and discussed improtance of maintaining daily monitoring schedule  THN CM Short Term Goal #2   Over the next 30 days, patient will rerport sigsn and syptoms of worsening peripheral edema/lymphedema  THN CM Short Term Goal #2 Start Date  10/24/17  Interventions for Short Term Goal #2  discussed signs and symptoms of worsening condition with patient and reminded her of importance of reporting changes to provider  [facility] CM Short Term Goal #3   Over the next 30 days, patient will work with [facility] and [facility] to structure visit schedule for the purpose of assistance with wound assessment and dressinsg changes  THN CM Short Term Goal #3 Start  Date  11/12/17  Interventions for Short Term Goal #3  reviewed HHRN, RNCM visit schedule with patient    [facility]: [facility] CM Care Plan Problem Three     Most Recent Value  Care Plan Problem Three  Pain Management Concerns  Role Documenting the Problem Three  Care Management Coordinator  Care Plan for Problem Three  Active  THN Long Term Goal   Over the next 31 days, patient will verbalize understanding of prescriber plan for management of short term pain   THN Long Term Goal Start Date  11/12/17  Interventions for Problem Three Long Term Goal  reviewed established pain management plan with patient  THN CM Short Term Goal #1   Over the next 48 hours, patient will verbalize receipt of newly prescribed medication for pain management  THN CM Short Term Goal #1 Start Date  11/12/17  Interventions for Short Term Goal #1  confirmed receipt of prescribed medication,  medications reviewed  THN CM Short Term  Goal #2   Over the next 30 days, patient will verbalize and demonstrate understaning of prescribed use of pharmacological pain management intervention  THN CM Short Term Goal #2 Start Date  11/12/17  Interventions for Short Term Goal #2  reviewed prescribing instructions and safety warnings for prescribed medication       [place] Management  [phone]

## 2017-11-13 NOTE — Telephone Encounter (Signed)
Pt will keep apt on 11/26 with dr Harrington Challenger

## 2017-11-15 DIAGNOSIS — I872 Venous insufficiency (chronic) (peripheral): Secondary | ICD-10-CM | POA: Diagnosis not present

## 2017-11-15 DIAGNOSIS — I130 Hypertensive heart and chronic kidney disease with heart failure and stage 1 through stage 4 chronic kidney disease, or unspecified chronic kidney disease: Secondary | ICD-10-CM | POA: Diagnosis not present

## 2017-11-15 DIAGNOSIS — N183 Chronic kidney disease, stage 3 (moderate): Secondary | ICD-10-CM | POA: Diagnosis not present

## 2017-11-15 DIAGNOSIS — I5041 Acute combined systolic (congestive) and diastolic (congestive) heart failure: Secondary | ICD-10-CM | POA: Diagnosis not present

## 2017-11-15 DIAGNOSIS — L97822 Non-pressure chronic ulcer of other part of left lower leg with fat layer exposed: Secondary | ICD-10-CM | POA: Diagnosis not present

## 2017-11-15 DIAGNOSIS — L97222 Non-pressure chronic ulcer of left calf with fat layer exposed: Secondary | ICD-10-CM | POA: Diagnosis not present

## 2017-11-18 ENCOUNTER — Encounter: Payer: Self-pay | Admitting: Internal Medicine

## 2017-11-18 ENCOUNTER — Telehealth: Payer: Self-pay | Admitting: Internal Medicine

## 2017-11-18 ENCOUNTER — Other Ambulatory Visit (HOSPITAL_COMMUNITY)
Admission: RE | Admit: 2017-11-18 | Discharge: 2017-11-18 | Disposition: A | Discharge status: Home / Self Care | Payer: Medicare Other | Source: Ambulatory Visit | Attending: Internal Medicine | Admitting: Internal Medicine

## 2017-11-18 ENCOUNTER — Ambulatory Visit (INDEPENDENT_AMBULATORY_CARE_PROVIDER_SITE_OTHER): Payer: Medicare Other | Admitting: Internal Medicine

## 2017-11-18 VITALS — BP 134/78 | HR 62 | Ht 67.0 in | Wt 297.0 lb

## 2017-11-18 DIAGNOSIS — I130 Hypertensive heart and chronic kidney disease with heart failure and stage 1 through stage 4 chronic kidney disease, or unspecified chronic kidney disease: Secondary | ICD-10-CM | POA: Diagnosis not present

## 2017-11-18 DIAGNOSIS — I5041 Acute combined systolic (congestive) and diastolic (congestive) heart failure: Secondary | ICD-10-CM | POA: Diagnosis not present

## 2017-11-18 DIAGNOSIS — I5031 Acute diastolic (congestive) heart failure: Secondary | ICD-10-CM | POA: Diagnosis not present

## 2017-11-18 DIAGNOSIS — I5043 Acute on chronic combined systolic (congestive) and diastolic (congestive) heart failure: Secondary | ICD-10-CM | POA: Diagnosis not present

## 2017-11-18 DIAGNOSIS — I472 Ventricular tachycardia, unspecified: Secondary | ICD-10-CM

## 2017-11-18 DIAGNOSIS — L97222 Non-pressure chronic ulcer of left calf with fat layer exposed: Secondary | ICD-10-CM | POA: Diagnosis not present

## 2017-11-18 DIAGNOSIS — I872 Venous insufficiency (chronic) (peripheral): Secondary | ICD-10-CM | POA: Diagnosis not present

## 2017-11-18 DIAGNOSIS — N183 Chronic kidney disease, stage 3 (moderate): Secondary | ICD-10-CM | POA: Diagnosis not present

## 2017-11-18 DIAGNOSIS — Z79899 Other long term (current) drug therapy: Secondary | ICD-10-CM

## 2017-11-18 DIAGNOSIS — L97822 Non-pressure chronic ulcer of other part of left lower leg with fat layer exposed: Secondary | ICD-10-CM | POA: Diagnosis not present

## 2017-11-18 LAB — BASIC METABOLIC PANEL
Anion gap: 6 (ref 5–15)
BUN: 33 mg/dL — ABNORMAL HIGH (ref 6–20)
CO2: 26 mmol/L (ref 22–32)
Calcium: 9 mg/dL (ref 8.9–10.3)
Chloride: 103 mmol/L (ref 101–111)
Creatinine, Ser: 1.54 mg/dL — ABNORMAL HIGH (ref 0.44–1.00)
GFR calc Af Amer: 43 mL/min — ABNORMAL LOW (ref >60)
GFR calc non Af Amer: 37 mL/min — ABNORMAL LOW (ref >60)
Glucose, Bld: 108 mg/dL — ABNORMAL HIGH (ref 65–99)
Potassium: 4.1 mmol/L (ref 3.5–5.1)
Sodium: 135 mmol/L (ref 135–145)

## 2017-11-18 LAB — BRAIN NATRIURETIC PEPTIDE: B Natriuretic Peptide: 81 pg/mL (ref 0.0–100.0)

## 2017-11-18 NOTE — Patient Instructions (Signed)
Your physician recommends that you schedule a follow-up appointment in: 2-3 weeks    Get lab work today : BMET,BNP    Your physician recommends that you continue on your current medications as directed. Please refer to the Current Medication list given to you today.     If you need a refill on your cardiac medications before your next appointment, please call your pharmacy.     No tests ordered today.       Thank you for choosing [facility] !

## 2017-11-18 NOTE — Telephone Encounter (Signed)
Would like to know results of labs and if she's getting a Rx for a fluid pill

## 2017-11-18 NOTE — Telephone Encounter (Signed)
I left message that Dr.Ross was with patients and I will call her with her dispo after reviewing her labs

## 2017-11-18 NOTE — Progress Notes (Signed)
Cardiology Office Note   Date:  11/18/2017   ID:  Kari [name], DOB Jul 04, 1961, MRN [id number]  PCP:  Rosita Fire, MD  Cardiologist:   Dorris Carnes, MD   F/U of systolic CHF    History of Present Illness: Kari [name] is a 56 y.o. female with a history of chronic systolic / diastolic CHF (LVEF 40 to 07% on echo in July 2018), moderate MR, normal oronary arteries in July 2018, [place], [place] and [place] arrrest (July 2018, [facility] Jude ICD)    I had seen the pt in Spring 2018  She had an echo in Dec 2017 that showed LVEF normal  55 to60^ Seen by B [facility]rader in early November with worsening LE edema that was weeping    Lasix increased a couple days prior  Wt 260 to 275 (in clinic 288 to 299)  Lasix was increased t o40 at that visit    No dizziness  Still tired at times  Not every day Still edema  Legs wrapped 3x per week  No palpitations         Current Meds  Medication Sig  . albuterol (PROVENTIL HFA;VENTOLIN HFA) 108 (90 Base) MCG/ACT inhaler Inhale 2 puffs into the lungs every 6 (six) hours as needed for shortness of breath.  Marland Kitchen aspirin EC 81 MG tablet Take 81 mg by mouth daily.  . carvedilol (COREG) 12.5 MG tablet Take 1 tablet (12.5 mg total) by mouth 2 (two) times daily with a meal.  . collagenase (SANTYL) ointment Apply 1 application topically as needed (for wound care). Apply to wound BLE per Tx  . furosemide (LASIX) 40 MG tablet Take 1 tablet (40 mg total) daily by mouth.  Marland Kitchen lisinopril (PRINIVIL,ZESTRIL) 2.5 MG tablet Take 1 tablet (2.5 mg total) by mouth daily.  Marland Kitchen loratadine (CLARITIN) 10 MG tablet Take 10 mg by mouth daily.  . nitrofurantoin, macrocrystal-monohydrate, (MACROBID) 100 MG capsule Take 100 mg by mouth 2 (two) times daily. Take 1 capsule by mouth twice daily for 5 days (started 08/08/17)  . ondansetron (ZOFRAN) 4 MG tablet Take 1 tablet (4 mg total) by mouth every 6 (six) hours as needed for nausea.  . potassium chloride SA (KLOR-CON M20) 20 MEQ tablet Take 2 tablets  (40 mEq total) daily by mouth.     Allergies:   Patient has no known allergies.   Past Medical History:  Diagnosis Date  . Asthma   . CHF (congestive heart failure) ([place]) 03/12/2014   a. EF 40-45% by echo in 06/2017 with normal cors by cath. ICD placed following VT arrest  . Depression   . Headache(784.0)   . Hyperlipidemia   . Hypertension   . Lung nodules   . Renal disorder   . Sarcoidosis   . Ulcer    left posterior calf    Past Surgical History:  Procedure Laterality Date  . cataract surgery Left 07-2013  . ICD IMPLANT N/A 07/05/2017   Procedure: ICD Implant;  Surgeon: Constance Haw, MD;  Location: [place] CV LAB;  Service: Cardiovascular;  Laterality: N/A;  . LEFT HEART CATH AND CORONARY ANGIOGRAPHY N/A 07/03/2017   Procedure: Left Heart Cath and Coronary Angiography;  Surgeon: Belva Crome, MD;  Location: [place] CV LAB;  Service: Cardiovascular;  Laterality: N/A;  . NO PAST SURGERIES       Social History:  The patient  reports that  has never smoked. she has never used smokeless tobacco. She reports that she  does not drink alcohol or use drugs.   Family History:  The patient's family history includes Cancer in her mother; Diabetes Mellitus II in her father and mother; Heart disease in her father; Heart failure in her mother; Hypertension in her mother; Stroke in her father.    ROS:  Please see the history of present illness. All other systems are reviewed and  Negative to the above problem except as noted.    PHYSICAL EXAM: VS:  BP 134/78 (BP Location: Right Arm)   Pulse 62   Ht 5\' 7"  (1.702 m)   Wt 297 lb (134.7 kg)   LMP 11/21/2011   SpO2 99%   BMI 46.52 kg/m   Obese 56 yo in no acute distress  HEENT: normal  Neck: JVP increasedto 9, carotid bruits, or masses Cardiac: RRR; no murmurs, rubs, or gallops, Legs wrapped 1+ edema  Respiratory:  clear to auscultation bilaterally, normal work of breathing GI: soft, nontender, nondistended, + BS   No hepatomegaly  MS: no deformity Moving all extremities   Skin: warm and dry, no rash Neuro:  Strength and sensation are intact Psych: euthymic mood, full affect   EKG:  EKG is not  ordered today.   Lipid Panel    Component Value Date/Time   CHOL 135 06/21/2012 0950   TRIG 59 06/21/2012 0950   HDL 54 06/21/2012 0950   CHOLHDL 2.5 06/21/2012 0950   VLDL 12 06/21/2012 0950   LDLCALC 69 06/21/2012 0950      Wt Readings from Last 3 Encounters:  11/18/17 297 lb (134.7 kg)  11/13/17 295 lb (133.8 kg)  11/06/17 270 lb (122.5 kg)      ASSESSMENT AND PLAN:  1  Acute on chronic systolic / diastolic CHF  Still with edema  Wt is still increased  Will check BMET and BNP today  Need to change dose  ? Switch to Demedex or Bumx for better absoprtion Will notify pt She says she is watching salt    Continue other meds for now   Once diuresed would be good to get echo to reeval LVEF  It was mild to mod decreased on last echo  2  Hx VT  S/P ICD    F/U in 3 wks  Would get echo before changing CHF meds  Beyond diuretic     Current medicines are reviewed at length with the patient today.  The patient does not have concerns regarding medicines.  Signed, Dorris Carnes, MD  11/18/2017 10:58 AM    [address], [place], [place]  [address] Phone: [phone]; Fax: [phone]

## 2017-11-19 ENCOUNTER — Other Ambulatory Visit: Payer: Self-pay | Admitting: *Deleted

## 2017-11-19 MED ORDER — TORSEMIDE 20 MG PO TABS: 20.0000 mg | ORAL_TABLET | Freq: Every day | ORAL | 1 refills | Status: DC

## 2017-11-19 NOTE — Telephone Encounter (Signed)
I spoke with pt, she will stop lasix and start demadex 20 mg daily,repeat bmet in 1 week

## 2017-11-19 NOTE — Patient Outreach (Addendum)
[facility]) Care Management  11/19/2017  Kari [name] 06/03/1961 [id number]  [name] is a 56 year old female with a hx of hypertension, sarcoidosis, hyperlipidemia, mixed CHF with most recent echocardiogram revealing EF of 40%-45%, nonischemic cardiomyopathy, history of V. fib arrest requiring defibrillation, status post ICD implantation on 07/05/2017.   [name] admitted to the hospitalon 07/20/2017 with dehydration, AKI, and hypotension. [name] has been admitted to the [name] 3 times in the last 6 months. After her hospitalization, I followed Mrs. Kari [name] at home for transition of care services and sheprogressed very well with the careful attention of home health and her own high level engagement in her own care. We were prepared to transition Mrs. Kari [name] to telephonic case management until she started having volume management problems and experienced worsening of her lower extremity lymphedema and skin wound on her left lower leg.   I reached out to [name] at home today to follow up on volume management and skin care needs.  Assessment: 56 year old female with CHF, Sarcoidosis, and lower extremity edema/lymphedema with impaired skin integrity.   Lower Extremity Edema/Lymphedema with Impaired Skin Integrity - Mrs. Kari [name] has been receiving twice weekly dressing changes (Santyl >W>D) by her home health nurse and has been performing the daily dressing changes independently on days her nurse does not come. Her nurse is scheduled to see her on Wednesday 11/20/17. She also is able to manage her electronic compression device daily without difficulty.    CHF/Volume Management - [name] saw her cardiologist in the office yesterday. Labs were drawn.   Today, [name]'s weight is 295# on her home scales (up from 268 on 10/29/17). She is taking medications as prescribed - Lasix dose to 37m QD and Potassium Chloride SA 263m QD.  Ms. HoSachsaw her cardiologist in the office on Monday and is awaiting further instructions after having had labs drawn. I reached out to the office RN Kari Murdersho followed up with the provider who ordered discontinuation of lasix and initiation of Demadex 20 mg daily, repeat bmet in 1 week (pt to stop by office next week for lab slip). I reviewed these instructions with Ms HoSalbergnd she is to have her family member pick up her new medication at the drug store tonight.     notes below re: daily weights:   Weight:  11/19/17: 295lb 11/13/17: 295lb 10/29/17: 268lb 10/28/17: 268lb 10/24/17: 272lb > additional diuretic and potassium   Plan:     [facility]     Most Recent Value  Care Plan Problem One  Knowledge Deficits re: management of chronic health management during holiday travel  Role Documenting the Problem One  Care Management [place] Problem One  Active  THN Long Term Goal   Over the next 31 days, patient will work with [facility] establish a self health management travel plan for holidays  THN Long Term Goal Start Date  10/21/17  Interventions for Problem One Long Term Goal  discussed with patient revised plans to hold off on travel until leg/skin condition is improved  THN CM Short Term Goal #1   Over the next 14 days, patient will begin written self heatlh mangaement plan for travel which includes action plan for emergencies  THN CM Short Term Goal #1 Start Date  10/21/17  Interventions for Short Term Goal #1  discussed patient's revised to plan to hold off on travel until  leg/skin condition improved  THN CM Short Term Goal #2   Over the next 14 days, patient will verbalize establishment of plan for medicaiton management during travel   [name] CM Short Term Goal #2 Start Date  10/21/17  Interventions for Short Term Goal #2  discussued plan to hold off on travel until leg/skin condition is improved  THN CM Short Term Goal #3  Over the next  14 days, patient will establish writen action plan for self health monitorin during travel   [facility] CM Short Term Goal #3 Start Date  10/21/17  Interventions for Short Tern Goal #3  patient will not travel for the next 2 weeks as she deals with her leg/skin condition    [name] CM Care Plan Problem Two     Most Recent Value  Care Plan Problem Two  Volume Management Concerns (Peripheral Edema/Swelling of bilareral LE)   Role Documenting the Problem Two  Care Management Kari [name] for Problem Two  Active  Interventions for Problem Two Long Term Goal   reviewed medication regimen change   THN Long Term Goal  Over the next 31 days, patient will verbalize improved condition of volume status  THN Long Term Goal Start Date  10/24/17  THN CM Short Term Goal #1   Over the next 30 days, patient will weigh daily, record, and notify provider for weight gain of 3# overnight or 5# in a week  THN CM Short Term Goal #1 Start Date  10/24/17  Interventions for Short Term Goal #2   discussed importance of continued daily weights/documentation/reporting  THN CM Short Term Goal #2   Over the next 30 days, patient will rerport sigsn and syptoms of worsening peripheral edema/lymphedema  THN CM Short Term Goal #2 Start Date  10/24/17  Interventions for Short Term Goal #2  reviewed patient assessment of signs/symptoms,  discussed importance of intentional self monitoring daily  THN CM Short Term Goal #3   Over the next 30 days, patient will work with Kari [name] and [name] to structure visit schedule for the purpose of assistance with wound assessment and dressinsg changes  THN CM Short Term Goal #3 Start Date  11/12/17  Interventions for Short Term Goal #3  discussed patient/HHRN dressing change vsit schedule    THN CM Care Plan Problem Three     Most Recent Value  Care Plan Problem Three  Pain Management Concerns  Role Documenting the Problem Three  Care Management Kari [name] for Problem Three  Active   THN Long Term Goal   Over the next 31 days, patient will verbalize understanding of prescriber plan for management of short term pain   THN Long Term Goal Start Date  11/12/17  Interventions for Problem Three Long Term Goal  pain assessment performed,  discussed ongoing pain management plane  THN CM Short Term Goal #1   Over the next 48 hours, patient will verbalize receipt of newly prescribed medication for pain management  THN CM Short Term Goal #1 Start Date  11/12/17  THN CM Short Term Goal #1 Met Date  11/21/17  THN CM Short Term Goal #2   Over the next 30 days, patient will verbalize and demonstrate understaning of prescribed use of pharmacological pain management intervention  THN CM Short Term Goal #2 Start Date  11/12/17  [name] CM Short Term Goal #2 Met Date  11/21/17  Interventions for Short Term Goal #2  discussed pain management plan and medication safety  [place] Management  [phone]

## 2017-11-19 NOTE — Telephone Encounter (Signed)
Would recom stopping lasix  She can try Demedex 20 mg   Follow up BMET in 1 wk

## 2017-11-20 ENCOUNTER — Other Ambulatory Visit: Payer: Self-pay | Admitting: *Deleted

## 2017-11-20 ENCOUNTER — Telehealth: Payer: Self-pay | Admitting: *Deleted

## 2017-11-20 NOTE — Patient Outreach (Signed)
[facility]) Care Management  11/20/2017  Kari [name] 16-Apr-1961 [id number]  I reached out to [name] by phone today to follow up on the change in her diuretic regimen. She had her family member pick up from the drug store and has started Demadex. She discontinued Lasix as instructed. She says she is "peeing like crazy!"   [name]'s HHRN is coming for a dressing change today.  Plan:  I will see [name] at home tomorrow for dressing change and general assessment and to follow up on volume status.    [place] Management  [phone]

## 2017-11-20 NOTE — Telephone Encounter (Signed)
Spoke with patient regarding [employer] monitor not updating.  Most recent network communication was on 09/03/17 per [employer].  Attempted to assist patient with troubleshooting home monitor, but was unsuccessful.  Advised patient to call [employer] tech support phone number for further assistance.  Patient verbalizes understanding of instructions and denies questions or concerns at this time.

## 2017-11-21 ENCOUNTER — Other Ambulatory Visit: Payer: Self-pay | Admitting: *Deleted

## 2017-11-21 NOTE — Patient Outreach (Addendum)
[name] [facility]) Care Management   11/21/2017  Argyle 1961-07-07 [id number]  Kari [name] is an 56 y.o. female with a hx of hypertension, sarcoidosis, hyperlipidemia, mixed CHF with most recent echocardiogram revealing EF of 40%-45%, nonischemic cardiomyopathy, history of V. fib arrest requiring defibrillation, status post ICD implantation on 07/05/2017.   [name] admitted to the hospitalon 07/20/2017 with dehydration, AKI, and hypotension. Kari [name] has been admitted to the hospital 3 times in the last 6 months. After her hospitalization, I followed Kari [name] at home for transition of care services and sheprogressed very well with the careful attention of home health and her own high level engagement in her own care. We were prepared to transition Kari [name] to telephonic case management until she started having volume management problems and experienced worsening of her lower extremity lymphedema and skin wound on her left lower leg.   I visited Kari [name] at home today to follow up on volume management and skin care needs.  Subjective: "My legs don't hurt as much. I've been peeing like crazy!"  Objective: BP 94/60   Pulse 68   LMP 11/21/2011   SpO2 96%    Review of Systems  Constitutional: Negative.   HENT: Negative.   Eyes: Negative.   Respiratory: Negative.  Negative for cough, sputum production, shortness of breath and wheezing.   Cardiovascular: Positive for leg swelling. Negative for chest pain and palpitations.       Bilateral lower extremity edema improved; 1+ bilateral pre-tibial edema  Gastrointestinal: Negative.   Genitourinary: Positive for frequency.       Reports increased frequency since discontinuation of Lasix and initiation of Demadex  Musculoskeletal: Positive for myalgias. Negative for falls.  Skin:       See notation in physical exam  Neurological: Positive for dizziness. Negative for focal weakness and  loss of consciousness.       Reports one episode of mild brief dizziness upon rising this morning  Psychiatric/Behavioral: Negative.     Physical Exam  Constitutional: She is oriented to person, place, and time. She appears well-developed and well-nourished. She is active. She does not have a sickly appearance. She does not appear ill.  Cardiovascular: Normal rate and regular rhythm.  Respiratory: Effort normal and breath sounds normal. She has no wheezes. She has no rhonchi. She has no rales.  GI: Soft. Bowel sounds are normal. She exhibits no distension.  Neurological: She is alert and oriented to person, place, and time.  Psychiatric: She has a normal mood and affect. Her speech is normal and behavior is normal. Judgment and thought content normal. Cognition and memory are normal.    Encounter Medications:   Outpatient Encounter Medications as of 11/21/2017  Medication Sig  . albuterol (PROVENTIL HFA;VENTOLIN HFA) 108 (90 Base) MCG/ACT inhaler Inhale 2 puffs into the lungs every 6 (six) hours as needed for shortness of breath.  Marland Kitchen aspirin EC 81 MG tablet Take 81 mg by mouth daily.  . carvedilol (COREG) 12.5 MG tablet Take 1 tablet (12.5 mg total) by mouth 2 (two) times daily with a meal.  . collagenase (SANTYL) ointment Apply 1 application topically as needed (for wound care). Apply to wound BLE per Tx  . lisinopril (PRINIVIL,ZESTRIL) 2.5 MG tablet Take 1 tablet (2.5 mg total) by mouth daily.  Marland Kitchen loratadine (CLARITIN) 10 MG tablet Take 10 mg by mouth daily.  . nitrofurantoin, macrocrystal-monohydrate, (MACROBID) 100 MG capsule Take 100 mg by mouth 2 (two) times daily. Take  1 capsule by mouth twice daily for 5 days (started 08/08/17)  . ondansetron (ZOFRAN) 4 MG tablet Take 1 tablet (4 mg total) by mouth every 6 (six) hours as needed for nausea.  . potassium chloride SA (KLOR-CON M20) 20 MEQ tablet Take 2 tablets (40 mEq total) daily by mouth.  . torsemide (DEMADEX) 20 MG tablet Take 1  tablet (20 mg total) by mouth daily.   Assessment: 56 year old female with CHF, Sarcoidosis, and lower extremity edema/lymphedema with impaired skin integrity.   Lower Extremity Edema/Lymphedema with Impaired Skin Integrity- Mrs. [name] has been receivingtwiceweekly dressing changes (Santyl >W>D) by her home health nurse and has been performing the daily dressing changes independently on days her nurse does not come. Her home health nurse performed a dressing change on Wednesday 11/20/17. She also is able to manage her electronic compression device daily without difficulty.  I visited Kari [name] today at home and supervised her as she changed her dressing. Please see images below:     11.29.18 Left Leg (see [place] for last week's images)                       11.29.18 Right Leg    (see [place] for last week's images)                                                                                                                                                                                                RECOMMENDATION: Referral to [facility]. Kari [name] had an appointment at the [facility] but cancelled it a month ago when she felt her legs were improving. I reached out to the [place] and was able to get her rescheduled but not until 01/07/18 @ 1:45. The scheduler was kind enough to put Kari [name] on the cancellation call list so that she can get in sooner if an appointment becomes available.   CHF/Volume Management- Kari [name] saw her cardiologist in the office on Monday, 11.26.18. Labs were drawn and Kari [name]'s lasix was subsequently discontinued and she was started on Demadex which she has been taking since 11/19/17. She has not yet weighed today but her weight yesterday was 291lb. She did report that she felt "the slightest bit dizzy" when she first got up this morning but has not felt dizzy or lightheaded since. She says her  legs feel noticeably better since they are less swollen. Her manual bp taken today by me is 94/60. I advised that she notify her cardiologist if she notices that she is feeling  dizzy or lightheaded. I also advised that she rise slowly when changing positions, especially before she starts to walk.   Daily Weights (patient's home digital scale):    Weight: 11/20/17: 291lb 11/19/17: 295lb 11/13/17: 295lb 10/29/17: 268lb 10/28/17: 268lb 10/24/17: 272lb > additional diuretic and potassium  Plan: Kari. Heinlen's HHRN will see her at home tomorrow for a dressing change.  Kari. Skop will change her own dressings over the weekend. She has demonstrated proper technique for me.  I will see Kari. Tetzloff at home on Monday 11/25/17.  Kari. Ivey has put the wound care center appointment on her calendar for 01/07/18.   [facility] CM Care Plan Problem Two     Most Recent Value  Care Plan Problem Two  Volume Management Concerns (Peripheral Edema/Swelling of bilareral LE)   Role Documenting the Problem Two  Care Management Coordinator  Care Plan for Problem Two  Active  Interventions for Problem Two Long Term Goal   reviewed signs and symptoms since change in medication regimen/treatment plan  THN Long Term Goal  Over the next 31 days, patient will verbalize improved condition of volume status  THN Long Term Goal Start Date  10/24/17  THN CM Short Term Goal #1   Over the next 30 days, patient will weigh daily, record, and notify provider for weight gain of 3# overnight or 5# in a week  THN CM Short Term Goal #1 Start Date  10/24/17  Interventions for Short Term Goal #2   reviewed patient documenation of self monitoring  THN CM Short Term Goal #2   Over the next 30 days, patient will rerport sigsn and syptoms of worsening peripheral edema/lymphedema  THN CM Short Term Goal #2 Start Date  10/24/17  Interventions for Short Term Goal #2  discused signs and symptoms which require communication with care team    [facility] CM Short Term Goal #3   Over the next 30 days, patient will work with [facility] and [place] to structure visit schedule for the purpose of assistance with wound assessment and dressinsg changes  THN CM Short Term Goal #3 Start Date  11/12/17  Interventions for Short Term Goal #3  reviewed HHRN and RNCM visit schedule    [facility] CM Care Plan Problem Three     Most Recent Value  Care Plan Problem Three  Pain Management Concerns  Role Documenting the Problem Three  Care Management Coordinator  Care Plan for Problem Three  Active  THN Long Term Goal   Over the next 31 days, patient will verbalize understanding of prescriber plan for management of short term pain   THN Long Term Goal Start Date  11/12/17  Interventions for Problem Three Long Term Goal  pain assessment performed,  reviewed pain management protocol and prescribed regimen      [place] Management  [phone]

## 2017-11-22 DIAGNOSIS — L97222 Non-pressure chronic ulcer of left calf with fat layer exposed: Secondary | ICD-10-CM | POA: Diagnosis not present

## 2017-11-22 DIAGNOSIS — I130 Hypertensive heart and chronic kidney disease with heart failure and stage 1 through stage 4 chronic kidney disease, or unspecified chronic kidney disease: Secondary | ICD-10-CM | POA: Diagnosis not present

## 2017-11-22 DIAGNOSIS — I5041 Acute combined systolic (congestive) and diastolic (congestive) heart failure: Secondary | ICD-10-CM | POA: Diagnosis not present

## 2017-11-22 DIAGNOSIS — L97822 Non-pressure chronic ulcer of other part of left lower leg with fat layer exposed: Secondary | ICD-10-CM | POA: Diagnosis not present

## 2017-11-22 DIAGNOSIS — N183 Chronic kidney disease, stage 3 (moderate): Secondary | ICD-10-CM | POA: Diagnosis not present

## 2017-11-22 DIAGNOSIS — I872 Venous insufficiency (chronic) (peripheral): Secondary | ICD-10-CM | POA: Diagnosis not present

## 2017-11-25 ENCOUNTER — Other Ambulatory Visit: Payer: Self-pay | Admitting: *Deleted

## 2017-11-25 DIAGNOSIS — I739 Peripheral vascular disease, unspecified: Secondary | ICD-10-CM | POA: Diagnosis not present

## 2017-11-25 DIAGNOSIS — N183 Chronic kidney disease, stage 3 (moderate): Secondary | ICD-10-CM | POA: Diagnosis not present

## 2017-11-25 DIAGNOSIS — B351 Tinea unguium: Secondary | ICD-10-CM | POA: Diagnosis not present

## 2017-11-25 DIAGNOSIS — I130 Hypertensive heart and chronic kidney disease with heart failure and stage 1 through stage 4 chronic kidney disease, or unspecified chronic kidney disease: Secondary | ICD-10-CM | POA: Diagnosis not present

## 2017-11-25 DIAGNOSIS — M79674 Pain in right toe(s): Secondary | ICD-10-CM | POA: Diagnosis not present

## 2017-11-25 DIAGNOSIS — M79675 Pain in left toe(s): Secondary | ICD-10-CM | POA: Diagnosis not present

## 2017-11-25 DIAGNOSIS — L97822 Non-pressure chronic ulcer of other part of left lower leg with fat layer exposed: Secondary | ICD-10-CM | POA: Diagnosis not present

## 2017-11-25 DIAGNOSIS — I5041 Acute combined systolic (congestive) and diastolic (congestive) heart failure: Secondary | ICD-10-CM | POA: Diagnosis not present

## 2017-11-25 DIAGNOSIS — L97222 Non-pressure chronic ulcer of left calf with fat layer exposed: Secondary | ICD-10-CM | POA: Diagnosis not present

## 2017-11-25 DIAGNOSIS — I872 Venous insufficiency (chronic) (peripheral): Secondary | ICD-10-CM | POA: Diagnosis not present

## 2017-11-25 NOTE — Patient Outreach (Signed)
[place] [facility]) Care Management  11/25/2017  LEATHIA FARNELL 01/06/1961 [id number]  I spoke briefly with Ms. Tugman by phone this morning. She said she was at the foot doctor's office. She has a scheduled visit with her home health nurse this afternoon for dressing change and assessment. She asked if I would return a call to her tomorrow to reschedule our visit for this week.   Plan: I will reach out to Mrs. Romulus by phone tomorrow.    [place] Management  [phone]

## 2017-11-25 NOTE — Patient Outreach (Signed)
[place] [facility]) Care Management  11/25/2017  MAZZY SANTARELLI 05-15-1961 [id number]  Call received from the [facility] indicated that a scheduling cancellation occurred and Ms. Kemler could now be seen at the [place] on 12/23/17 @1 :30pm (in lieu of 01/07/18 @ 1:45pm). Ms. Sebek agreed to this change and is now scheduled for a visit with the provider at the [place] on the 12/23/17 schedule.   Plan: I will see Ms. Ricci at home tomorrow for routine assessment and skin care.   THN CM Care Plan Problem Two     Most Recent Value  THN CM Short Term Goal #3   Over the next 30 days, patient will work with [facility] and [place] to structure visit schedule for the purpose of assistance with wound assessment and dressinsg changes  THN CM Short Term Goal #3 Start Date  11/12/17  Interventions for Short Term Goal #3  discussed HHRN visit schedule,  scheduled RNCM for visit 11/26/17  THN CM Short Term Goal #4  Over the next 30 days, patient will see provider in wound care clinic for ongoing skin care and wound management needs  [facility] CM Short Term Goal #4 Start Date  11/25/17  Interventions of Short Term Goal #4  scheduled patient with wound care provider at clinic for 12/23/17 @ 1:45pm     [place] Management  [phone]

## 2017-11-26 ENCOUNTER — Other Ambulatory Visit: Payer: Self-pay | Admitting: *Deleted

## 2017-11-26 NOTE — Patient Outreach (Signed)
[place] [facility]) Care Management   11/26/2017  Ludlow 08-11-1961 [id number]  Kari [name] is an 56 y.o. female with a hx of hypertension, sarcoidosis, hyperlipidemia, mixed CHF with most recent echocardiogram revealing EF of 40%-45%, nonischemic cardiomyopathy, history of V. fib arrest requiring defibrillation, status post ICD implantation on 07/05/2017.   [name] admitted to the hospitalon 07/20/2017 with dehydration, AKI, and hypotension. [name] has been admitted to the hospital 3 times in the last 6 months. After her hospitalization, I followed Kari [name] at home for transition of care services and sheprogressed very well with the careful attention of home health and her own high level engagement in her own care. We were prepared to transition Kari [name] to telephonic case management until she started having volume management problems and experienced worsening of her lower extremity lymphedema and skin wound on her left lower leg.   Iwas scheduled to visitMs. [name] at home today to follow up on volume management and skin/wound care needs. She called to report that she needed to go out to run errands in anticipation of her plans to be out of town at the end of this week and weekend.     Encounter Medications:   Outpatient Encounter Medications as of 11/26/2017  Medication Sig  . albuterol (PROVENTIL HFA;VENTOLIN HFA) 108 (90 Base) MCG/ACT inhaler Inhale 2 puffs into the lungs every 6 (six) hours as needed for shortness of breath.  Marland Kitchen aspirin EC 81 MG tablet Take 81 mg by mouth daily.  . carvedilol (COREG) 12.5 MG tablet Take 1 tablet (12.5 mg total) by mouth 2 (two) times daily with a meal.  . collagenase (SANTYL) ointment Apply 1 application topically as needed (for wound care). Apply to wound BLE per Tx  . lisinopril (PRINIVIL,ZESTRIL) 2.5 MG tablet Take 1 tablet (2.5 mg total) by mouth daily.  Marland Kitchen loratadine (CLARITIN) 10 MG tablet Take  10 mg by mouth daily.  . nitrofurantoin, macrocrystal-monohydrate, (MACROBID) 100 MG capsule Take 100 mg by mouth 2 (two) times daily. Take 1 capsule by mouth twice daily for 5 days (started 08/08/17)  . ondansetron (ZOFRAN) 4 MG tablet Take 1 tablet (4 mg total) by mouth every 6 (six) hours as needed for nausea.  . potassium chloride SA (KLOR-CON M20) 20 MEQ tablet Take 2 tablets (40 mEq total) daily by mouth.  . torsemide (DEMADEX) 20 MG tablet Take 1 tablet (20 mg total) by mouth daily.   Assessment:  56 year old female with CHF, Sarcoidosis, and lower extremity edema/lymphedema with impaired skin integrity.   Lower Extremity Edema/Lymphedema with Impaired Skin Integrity- Mrs. [name] has been receivingtwiceweekly dressing changes (Santyl >W>D) by her home health nurse and has been performing the daily dressing changes independently on days her nurse does not come.Her home health nurse performed a dressing change yesterday, 11/25/17 and is scheduled to return on Wednesday.   Kari [name] denies new or worsened symptoms. She does not complain of leg pain and says that since she started taking Demadex and her legs are less swollen they are far more comfortable.  Kari [name] cancelled her original [place] appointment a few months ago because she felt her wounds were improving but they clearly are now not improving. I called the [place] and she is currently scheduled for a visit on 12/23/17 @ 1:30pm. I also asked that she be put on the call list for cancellations. Kari [name] is aware of this appointment and it is  on her calendar. She says she will have transportation.    CHF/Volume Management- [name] her cardiologist in the office on Monday, 11.26.18. Labs were drawn and Kari [name]'s lasix was subsequently discontinued and she was started on Demadex which she has been taking since 11/19/17. She reported last week that she felt "the slightest bit  dizzy" when she first got up in the morning on 11.29.18 but says she has not felt dizzy or lightheaded since. Her manually checked bp (by me) on 11.29.18 was 94/60. I advised that she notify her cardiologist if she notices that she is feeling dizzy or lightheaded. I also advised that she rise slowly when changing positions, especially before she starts to walk. Kari [name] has a digital bp monitor at home and checks her bp daily (on days the [facility] does not come to check it).   Dr. Harrington Challenger has requested another appointment with Kari [name]. I am working with office RN Vernie Murders to coordinate an appointment around Kari [name]'s travel at the end of this week.   Daily Weights (patient's home digital scale):    11/26/17: 290lb 11/20/17: 291lb 11/19/17:295lb 11/13/17: 295lb 10/29/17: 268lb 10/28/17: 268lb 10/24/17: 272lb   Plan:  Kari [name]'s HHRN will see her at home tomorrow for a dressing change.   Kari [name] will change her own dressings on days her [facility] or I do not visit. She has demonstrated proper technique for me.   Kari [name] will attend her [place] appointment on 12/23/17 or sooner if a cancellation permits an earlier appointment.   Kari [name] will see Dr. Harrington Challenger in the office as scheduled for follow up after her recent medication change (Lasix > Demadex).   [facility] CM Care Plan Problem Two     Most Recent Value  Care Plan Problem Two  Volume Management Concerns (Peripheral Edema/Swelling of bilareral LE)   Role Documenting the Problem Two  Care Management Coordinator  Care Plan for Problem Two  Active  Interventions for Problem Two Long Term Goal   discussed ongoing monitoring and treatment plan for volume management,  collaboration wtih cardiology office RN re: follow up with cardiologist  [facility] Long Term Goal  Over the next 31 days, patient will verbalize improved condition of volume status  THN Long Term Goal Start Date  10/24/17  THN CM Short Term Goal #1    Over the next 30 days, patient will weigh daily, record, and notify provider for weight gain of 3# overnight or 5# in a week  THN CM Short Term Goal #1 Start Date  10/24/17  Interventions for Short Term Goal #2   reviewed patient documentation of daily weights and volume assessment and discussed the improtance of ongoing close monitoring and reporting of symptoms/changes to MD  [facility] CM Short Term Goal #2   Over the next 30 days, patient will rerport sigsn and syptoms of worsening peripheral edema/lymphedema  THN CM Short Term Goal #2 Start Date  10/24/17  Interventions for Short Term Goal #2  reviewed patient self assessment guidelines,  discussed the importance of reporting changes to MD  [facility] CM Short Term Goal #3   Over the next 30 days, patient will work with [facility] and [place] to structure visit schedule for the purpose of assistance with wound assessment and dressinsg changes  THN CM Short Term Goal #3 Start Date  11/12/17  Interventions for Short Term Goal #3  reviewed HHRN, RNCM visit schedule with patient  [facility] CM Short Term Goal #  4  Over the next 30 days, patient will see provider in wound care clinic for ongoing skin care and wound management needs  [facility] CM Short Term Goal #4 Start Date  11/25/17  Interventions of Short Term Goal #4  ensured that upcoming [place] appointment is on patient calendar    [facility] CM Care Plan Problem Three     Most Recent Value  Care Plan Problem Three  Pain Management Concerns  Role Documenting the Problem Three  Care Management [place] for Problem Three  Active  THN Long Term Goal   Over the next 31 days, patient will verbalize understanding of prescriber plan for management of short term pain   THN Long Term Goal Start Date  11/12/17  Interventions for Problem Three Long Term Goal  discussed pain management and self care plan      [place] Care Management  [phone]

## 2017-11-27 ENCOUNTER — Other Ambulatory Visit (HOSPITAL_COMMUNITY)
Admission: RE | Admit: 2017-11-27 | Discharge: 2017-11-27 | Disposition: A | Discharge status: Home / Self Care | Payer: Medicare Other | Source: Ambulatory Visit | Attending: Internal Medicine | Admitting: Internal Medicine

## 2017-11-27 DIAGNOSIS — Z79899 Other long term (current) drug therapy: Secondary | ICD-10-CM | POA: Diagnosis not present

## 2017-11-27 LAB — BASIC METABOLIC PANEL
Anion gap: 7 (ref 5–15)
BUN: 43 mg/dL — ABNORMAL HIGH (ref 6–20)
CO2: 26 mmol/L (ref 22–32)
Calcium: 9.1 mg/dL (ref 8.9–10.3)
Chloride: 101 mmol/L (ref 101–111)
Creatinine, Ser: 1.82 mg/dL — ABNORMAL HIGH (ref 0.44–1.00)
GFR calc Af Amer: 35 mL/min — ABNORMAL LOW (ref >60)
GFR calc non Af Amer: 30 mL/min — ABNORMAL LOW (ref >60)
Glucose, Bld: 103 mg/dL — ABNORMAL HIGH (ref 65–99)
Potassium: 4 mmol/L (ref 3.5–5.1)
Sodium: 134 mmol/L — ABNORMAL LOW (ref 135–145)

## 2017-11-28 ENCOUNTER — Ambulatory Visit: Payer: Medicare Other | Admitting: Internal Medicine

## 2017-11-28 ENCOUNTER — Encounter (HOSPITAL_BASED_OUTPATIENT_CLINIC_OR_DEPARTMENT_OTHER): Payer: Medicare Other | Attending: Internal Medicine

## 2017-11-28 DIAGNOSIS — N183 Chronic kidney disease, stage 3 (moderate): Secondary | ICD-10-CM | POA: Diagnosis not present

## 2017-11-28 DIAGNOSIS — L97222 Non-pressure chronic ulcer of left calf with fat layer exposed: Secondary | ICD-10-CM | POA: Diagnosis not present

## 2017-11-28 DIAGNOSIS — L97822 Non-pressure chronic ulcer of other part of left lower leg with fat layer exposed: Secondary | ICD-10-CM | POA: Diagnosis not present

## 2017-11-28 DIAGNOSIS — I130 Hypertensive heart and chronic kidney disease with heart failure and stage 1 through stage 4 chronic kidney disease, or unspecified chronic kidney disease: Secondary | ICD-10-CM | POA: Diagnosis not present

## 2017-11-28 DIAGNOSIS — I872 Venous insufficiency (chronic) (peripheral): Secondary | ICD-10-CM | POA: Diagnosis not present

## 2017-11-28 DIAGNOSIS — I5041 Acute combined systolic (congestive) and diastolic (congestive) heart failure: Secondary | ICD-10-CM | POA: Diagnosis not present

## 2017-11-29 ENCOUNTER — Telehealth: Payer: Self-pay | Admitting: Internal Medicine

## 2017-11-29 NOTE — Telephone Encounter (Signed)
Patient calling, states that she is taking two potassium pills and would like to see if she should continue taking two or just one.

## 2017-11-29 NOTE — Telephone Encounter (Signed)
Patient asking if she should be taking 2 potassium pills. Patient recently changed from lasix to torsemide. Patient was taking potassium 2 tablets (40 mEq total) QD while on the lasix and is wanting to know if she should continue taking it with the torsemide. Instructed patient to continue taking the potassium 2 tablets (40 mEq total) QD as she has been doing. Patient verbalized understanding and thanked me for the call.

## 2017-12-02 ENCOUNTER — Ambulatory Visit: Payer: Medicare Other | Admitting: Internal Medicine

## 2017-12-03 ENCOUNTER — Other Ambulatory Visit: Payer: Self-pay | Admitting: *Deleted

## 2017-12-03 DIAGNOSIS — I872 Venous insufficiency (chronic) (peripheral): Secondary | ICD-10-CM | POA: Diagnosis not present

## 2017-12-03 NOTE — Patient Outreach (Signed)
[place] [facility]) Care Management  12/03/2017  DEVYNE HAUGER 1961-04-18 [id number]  Brief telephone collaboration/care coordination re: HHRN/RNCM home visit scheduled. Ms.Kiehn's HHRN is to visit tomorrow (Wednesday) for assessment and dressing change.   Plan: I will see Ms. Behunin at home on Thursday, 12/04/17 for general assessment and dressing change.   THN CM Care Plan Problem Two     Most Recent Value  THN CM Short Term Goal #3   Over the next 30 days, patient will work with [facility] and [place] to structure visit schedule for the purpose of assistance with wound assessment and dressinsg changes  THN CM Short Term Goal #3 Start Date  11/12/17  Interventions for Short Term Goal #3  collaboration with patient/HHRN re: visit schedule     [place] Care Management  [phone]

## 2017-12-04 NOTE — Progress Notes (Signed)
Cardiology Office Note    Date:  12/05/2017   ID:  KEON BENSCOTER, DOB Jun 16, 1961, MRN [id number]  PCP:  Rosita Fire, MD  Cardiologist:  Dr. Harrington Challenger  Chief Complaint  Patient presents with  . Follow-up    3 week visit    History of Present Illness:    Kari [name] is a 56 y.o. female with past medical history of chronic combined systolic and diastolic CHF (EF 13-24% by echo in 06/2017), moderate MR, normal cors by cath in 06/2017, HTN, HLD, and history of VT arrest (s/p St Jude ICD placement in 06/2017) who presents to the office today for 3-week follow-up.   She was examined by myself on 10/30/2017 and was noted to have 2+ pitting edema bilaterally. Her Lasix dosing was increased from 20 mg daily to 40 mg daily at that time. She followed-up with Dr. Harrington Challenger on 11/18/2017 and still had significant edema. BNP was checked at that time and WNL at 81 with creatinine increased to 1.54. Lasix was stopped and she was switched to Torsemide 20mg  daily with plans for a repeat BMET in 1 week. This was rechecked on 12/5 and creatinine was elevated to 1.82.  In talking with the patient today, she reports having worsening lower extremity edema since her last office visit. She denies any associated chest pain, dyspnea on exertion, orthopnea, or PND.  Weight has further increased from 297 lbs to 305 lbs. In reviewing her dietary intake, she reports consuming more salads. However, she has been utilizing [place] and is using a relatively large amount of this.   She has a lymphedema compression machine but only uses this for an hour per day. She reports having pain along her legs with this and was prescribed Motrin by her PCP at the same time Lasix was switched to Torsemide.    Past Medical History:  Diagnosis Date  . Asthma   . CHF (congestive heart failure) ([place]) 03/12/2014   a. EF 40-45% by echo in 06/2017 with normal cors by cath. ICD placed following VT arrest  . Depression    . Headache(784.0)   . Hyperlipidemia   . Hypertension   . Lung nodules   . Renal disorder   . Sarcoidosis   . Ulcer    left posterior calf    Past Surgical History:  Procedure Laterality Date  . cataract surgery Left 07-2013  . ICD IMPLANT N/A 07/05/2017   Procedure: ICD Implant;  Surgeon: Constance Haw, MD;  Location: [facility];  Service: Cardiovascular;  Laterality: N/A;  . LEFT HEART CATH AND CORONARY ANGIOGRAPHY N/A 07/03/2017   Procedure: Left Heart Cath and Coronary Angiography;  Surgeon: Belva Crome, MD;  Location: [facility];  Service: Cardiovascular;  Laterality: N/A;  . NO PAST SURGERIES      Current Medications: Outpatient Medications Prior to Visit  Medication Sig Dispense Refill  . albuterol (PROVENTIL HFA;VENTOLIN HFA) 108 (90 Base) MCG/ACT inhaler Inhale 2 puffs into the lungs every 6 (six) hours as needed for shortness of breath. 18 g 0  . aspirin EC 81 MG tablet Take 81 mg by mouth daily.    . carvedilol (COREG) 12.5 MG tablet Take 1 tablet (12.5 mg total) by mouth 2 (two) times daily with a meal. 180 tablet 3  . collagenase (SANTYL) ointment Apply 1 application topically as needed (for wound care). Apply to wound BLE per Tx 15 g 0  . loratadine (CLARITIN) 10  MG tablet Take 10 mg by mouth daily.    . nitrofurantoin, macrocrystal-monohydrate, (MACROBID) 100 MG capsule Take 100 mg by mouth 2 (two) times daily. Take 1 capsule by mouth twice daily for 5 days (started 08/08/17)    . ondansetron (ZOFRAN) 4 MG tablet Take 1 tablet (4 mg total) by mouth every 6 (six) hours as needed for nausea. 20 tablet 0  . potassium chloride SA (KLOR-CON M20) 20 MEQ tablet Take 2 tablets (40 mEq total) daily by mouth. 60 tablet 6  . torsemide (DEMADEX) 20 MG tablet Take 1 tablet (20 mg total) by mouth daily. 90 tablet 1  . lisinopril (PRINIVIL,ZESTRIL) 2.5 MG tablet Take 1 tablet (2.5 mg total) by mouth daily. 90 tablet 3   No facility-administered medications  prior to visit.      Allergies:   Patient has no known allergies.   Social History   Socioeconomic History  . Marital status: Single    Spouse name: None  . Number of children: 1  . Years of education: None  . Highest education level: None  Social Needs  . Financial resource strain: None  . Food insecurity - worry: None  . Food insecurity - inability: None  . Transportation needs - medical: None  . Transportation needs - non-medical: None  Occupational History  . None  Tobacco Use  . Smoking status: Never Smoker  . Smokeless tobacco: Never Used  Substance and Sexual Activity  . Alcohol use: No  . Drug use: No  . Sexual activity: Not Currently  Other Topics Concern  . None  Social History Narrative   Lives alone.     Family History:  The patient's family history includes Cancer in her mother; Diabetes Mellitus II in her father and mother; Heart disease in her father; Heart failure in her mother; Hypertension in her mother; Stroke in her father.   Review of Systems:   Please see the history of present illness.     General:  No chills, fever, night sweats or weight changes.  Cardiovascular:  No chest pain, dyspnea on exertion, edema, orthopnea, palpitations, paroxysmal nocturnal dyspnea. Dermatological: No rash, lesions/masses Respiratory: No cough, dyspnea Urologic: No hematuria, dysuria Abdominal:   No nausea, vomiting, diarrhea, bright red blood per rectum, melena, or hematemesis Neurologic:  No visual changes, wkns, changes in mental status. All other systems reviewed and are otherwise negative except as noted above.   Physical Exam:    VS:  BP 128/60 (BP Location: Left Arm)   Pulse 70   Ht 5\' 7"  (1.702 m)   Wt (!) 305 lb (138.3 kg)   LMP 11/21/2011   SpO2 98%   BMI 47.77 kg/m    General: Well developed, morbidly obese African American female appearing in no acute distress. Head: Normocephalic, atraumatic, sclera non-icteric, no xanthomas, nares are  without discharge.  Neck: No carotid bruits. JVD not elevated.  Lungs: Respirations regular and unlabored, without wheezes or rales.  Heart: Regular rate and rhythm. No S3 or S4.  No murmur, no rubs, or gallops appreciated. Abdomen: Soft, non-tender, non-distended with normoactive bowel sounds. No hepatomegaly. No rebound/guarding. No obvious abdominal masses. Msk:  Strength and tone appear normal for age. No joint deformities or effusions. Extremities: No clubbing or cyanosis. 2+ pitting edema. Legs are wrapped.  Distal pedal pulses are 2+ bilaterally. Neuro: Alert and oriented X 3. Moves all extremities spontaneously. No focal deficits noted. Psych:  Responds to questions appropriately with a normal affect. Skin: No rashes  or lesions noted  Wt Readings from Last 3 Encounters:  12/05/17 (!) 305 lb (138.3 kg)  12/05/17 292 lb (132.5 kg)  12/03/17 291 lb (132 kg)     Studies/Labs Reviewed:   EKG:  EKG is not ordered today.   Recent Labs: 06/26/2017: TSH 9.201 07/01/2017: Magnesium 2.4 07/19/2017: ALT 14 10/30/2017: Hemoglobin 10.5; Platelets 203 11/18/2017: B Natriuretic Peptide 81.0 12/05/2017: BUN 51; Creatinine, Ser 1.74; Potassium 4.5; Sodium 133   Lipid Panel    Component Value Date/Time   CHOL 135 06/21/2012 0950   TRIG 59 06/21/2012 0950   HDL 54 06/21/2012 0950   CHOLHDL 2.5 06/21/2012 0950   VLDL 12 06/21/2012 0950   LDLCALC 69 06/21/2012 0950    Additional studies/ records that were reviewed today include:   Echocardiogram: 06/27/2017 Study Conclusions  - Left ventricle: Diffuse hypokinesis worse in the inferior basal   wall The cavity size was moderately dilated. Wall thickness was   increased in a pattern of moderate LVH. Systolic function was   mildly to moderately reduced. The estimated ejection fraction was   in the range of 40% to 45%. Left ventricular diastolic function   parameters were normal. - Aortic valve: There was mild regurgitation. - Mitral  valve: There was moderate regurgitation. - Left atrium: The atrium was moderately dilated. - Atrial septum: No defect or patent foramen ovale was identified.  Cardiac Catheterization: 07/03/2017  Left dominant coronary anatomy.  Normal coronary arteries.  Mild to moderate global left ventricular hypo-kinesis with estimated EF 35-40%. LV end-diastolic pressure is up and normal. Combined systolic and diastolic heart failure, chronic   Recommendations:   Further management per treating team including CAD including guideline directed heart failure therapy  EP evaluation for possible AICD.  Assessment:    1. Chronic combined systolic (congestive) and diastolic (congestive) heart failure ([place])   2. Edema, unspecified type   3. Drug therapy   4. Nonischemic cardiomyopathy ([place])   5. Essential hypertension   6. Mitral valve insufficiency, unspecified etiology   7. CKD (chronic kidney disease) stage 3, GFR 30-59 ml/min (HCC)      Plan:   In order of problems listed above:  1. Chronic Combined Systolic and Diastolic CHF/ Lower Extremity Edema - the patient has been closely followed for worsening lower extremity edema over the past month which is likely secondary to CHF and lymphedema. BNP was recently checked and WNL at 80 and creatinine was elevated after switching to Torsemide which raises concern for over-diuresis.  - the patient's weight has continued to trend upwards, at 305 lbs today. While she has chronic pitting edema, her lungs are clear. I will not further titrate her Torsemide dosing due to recent trend in creatinine. Will recheck BMET today and if creatinine is continuing to trend upwards, may need to reduce Torsemide to 10mg  daily. She has been taking NSAIDS regularly and I asked her to switch to Tylenol. She has been trying to consume more salads but unfortunately has also been consuming high-sodium salad dressings, therefore we discussed limiting intake of this.  - she  is using her compression machine for 1 hour per day. I recommended she increase this to 1hr in AM and 1hr in PM.   2. Nonischemic Cardiomyopathy - patient had a VT arrest in 06/2017 with St.Jude ICD placed at that time.  - device followed by Dr. Curt Bears  3. HTN - BP is well-controlled at 128/60 during today's visit.  - continue current medication regimen.  4. Mitral Regurgitation   - moderate by echo in 06/2017. Continue to follow.   5. Acute on Chronic Stage 3 CKD - baseline creatinine 1.2 - 1.3. - elevated to 1.82 on most recent check after being switched to Torsemide.  - will recheck BMET today. She has been taking Ibuprofen 2-3 times daily. Recommended she avoid NSAIDS and use Tylenol PRN.    Medication Adjustments/Labs and Tests Ordered: Current medicines are reviewed at length with the patient today.  Concerns regarding medicines are outlined above.  Medication changes, Labs and Tests ordered today are listed in the Patient Instructions below. Patient Instructions  Medication Instructions:  Your physician has recommended you make the following change in your medication: Stop Taking Ibuprofen  Start Tylenol 650 mg Two Times Daily as needed for pain   Labwork: Your physician recommends that you return for lab work in: Today   Testing/Procedures: NONE   Follow-Up: Your physician recommends that you schedule a follow-up appointment in: 3-4 Weeks   Any Other Special Instructions Will Be Listed Below (If Applicable). Use your leg machine for 2 hours daily.   If you need a refill on your cardiac medications before your next appointment, please call your pharmacy.  Thank you for choosing [facility]!    Signed, Erma Heritage, PA-C  12/05/2017 4:32 PM    [facility] [place], [place], [place]  [address] Phone: [phone]; Fax: [phone]  [address] [place], [place] [address] Phone:  [phone]

## 2017-12-05 ENCOUNTER — Other Ambulatory Visit: Payer: Self-pay | Admitting: *Deleted

## 2017-12-05 ENCOUNTER — Other Ambulatory Visit (HOSPITAL_COMMUNITY)
Admission: RE | Admit: 2017-12-05 | Discharge: 2017-12-05 | Disposition: A | Discharge status: Home / Self Care | Payer: Medicare Other | Source: Ambulatory Visit | Attending: Student | Admitting: Student

## 2017-12-05 ENCOUNTER — Encounter: Payer: Self-pay | Admitting: Student

## 2017-12-05 ENCOUNTER — Ambulatory Visit (INDEPENDENT_AMBULATORY_CARE_PROVIDER_SITE_OTHER): Payer: Medicare Other | Admitting: Student

## 2017-12-05 VITALS — BP 128/60 | HR 70 | Ht 67.0 in | Wt 305.0 lb

## 2017-12-05 DIAGNOSIS — Z79899 Other long term (current) drug therapy: Secondary | ICD-10-CM

## 2017-12-05 DIAGNOSIS — N183 Chronic kidney disease, stage 3 unspecified: Secondary | ICD-10-CM

## 2017-12-05 DIAGNOSIS — I428 Other cardiomyopathies: Secondary | ICD-10-CM

## 2017-12-05 DIAGNOSIS — I10 Essential (primary) hypertension: Secondary | ICD-10-CM | POA: Diagnosis not present

## 2017-12-05 DIAGNOSIS — L97822 Non-pressure chronic ulcer of other part of left lower leg with fat layer exposed: Secondary | ICD-10-CM | POA: Diagnosis not present

## 2017-12-05 DIAGNOSIS — I5042 Chronic combined systolic (congestive) and diastolic (congestive) heart failure: Secondary | ICD-10-CM

## 2017-12-05 DIAGNOSIS — I5041 Acute combined systolic (congestive) and diastolic (congestive) heart failure: Secondary | ICD-10-CM | POA: Diagnosis not present

## 2017-12-05 DIAGNOSIS — I340 Nonrheumatic mitral (valve) insufficiency: Secondary | ICD-10-CM

## 2017-12-05 DIAGNOSIS — R609 Edema, unspecified: Secondary | ICD-10-CM | POA: Diagnosis not present

## 2017-12-05 DIAGNOSIS — I872 Venous insufficiency (chronic) (peripheral): Secondary | ICD-10-CM | POA: Diagnosis not present

## 2017-12-05 DIAGNOSIS — L97222 Non-pressure chronic ulcer of left calf with fat layer exposed: Secondary | ICD-10-CM | POA: Diagnosis not present

## 2017-12-05 DIAGNOSIS — I130 Hypertensive heart and chronic kidney disease with heart failure and stage 1 through stage 4 chronic kidney disease, or unspecified chronic kidney disease: Secondary | ICD-10-CM | POA: Diagnosis not present

## 2017-12-05 LAB — BASIC METABOLIC PANEL
Anion gap: 6 (ref 5–15)
BUN: 51 mg/dL — ABNORMAL HIGH (ref 6–20)
CO2: 26 mmol/L (ref 22–32)
Calcium: 9.2 mg/dL (ref 8.9–10.3)
Chloride: 101 mmol/L (ref 101–111)
Creatinine, Ser: 1.74 mg/dL — ABNORMAL HIGH (ref 0.44–1.00)
GFR calc Af Amer: 37 mL/min — ABNORMAL LOW (ref >60)
GFR calc non Af Amer: 32 mL/min — ABNORMAL LOW (ref >60)
Glucose, Bld: 104 mg/dL — ABNORMAL HIGH (ref 65–99)
Potassium: 4.5 mmol/L (ref 3.5–5.1)
Sodium: 133 mmol/L — ABNORMAL LOW (ref 135–145)

## 2017-12-05 MED ORDER — ACETAMINOPHEN ER 650 MG PO TBCR: 650.0000 mg | EXTENDED_RELEASE_TABLET | Freq: Two times a day (BID) | ORAL | 11 refills | Status: DC

## 2017-12-05 NOTE — Patient Outreach (Signed)
[facility]) Care Management   12/05/2017  Madalynn FAJR FIFE 09-03-1961 [id number]  KEARRA CALKIN is an 56 y.o. female with a hx of hypertension, sarcoidosis, hyperlipidemia, mixed CHF with most recent echocardiogram revealing EF of 40%-45%, nonischemic cardiomyopathy, history of V. fib arrest requiring defibrillation, status post ICD implantation on 07/05/2017.   [name] admitted to the hospitalon 07/20/2017 with dehydration, AKI, and hypotension. Ms. Riesen has been admitted to the hospital 3 times in the last 6 months. After her hospitalization, I followed Mrs. Buser at home for transition of care services and sheprogressed very well with the careful attention of home health and her own high level engagement in her own care. We were prepared to transition Mrs. Schiro to telephonic case management until she started having volume management problems and experienced worsening of her lower extremity lymphedema and skin wound on her left lower leg.   I am visiting Ms. Brackens at home today to follow up on her health needs as outlined above.   Subjective: "I feel good except that my legs are swelling up again,"  Objective:  BP 114/73   Pulse 65   Wt 292 lb (132.5 kg)   LMP 11/21/2011   SpO2 98%   BMI 45.73 kg/m   Review of Systems  Constitutional: Negative.  Negative for chills and fever.  Respiratory: Negative for cough, sputum production, shortness of breath and wheezing.   Cardiovascular: Positive for leg swelling. Negative for chest pain and palpitations.       2+ edema pretibial to just below knows BLE  Gastrointestinal: Negative.   Genitourinary: Positive for frequency.       Patient reports frequency since changing diuretic regimen  Musculoskeletal: Positive for myalgias. Negative for falls.  Neurological: Negative.  Negative for dizziness, sensory change, focal weakness, loss of consciousness and weakness.  Psychiatric/Behavioral:  Negative.     Physical Exam  Constitutional: She is oriented to person, place, and time. Vital signs are normal. She appears well-developed and well-nourished. She is active. She does not have a sickly appearance. She does not appear ill.  Cardiovascular: Normal rate and regular rhythm.  Respiratory: Effort normal and breath sounds normal. She has no wheezes. She has no rhonchi. She has no rales.  GI: Soft. Bowel sounds are normal. She exhibits no distension. There is no tenderness.  Musculoskeletal: Normal range of motion.  Neurological: She is alert and oriented to person, place, and time.  Skin:     Psychiatric: She has a normal mood and affect. Her speech is normal and behavior is normal. Judgment and thought content normal. Cognition and memory are normal.    Encounter Medications:   Outpatient Encounter Medications as of 12/05/2017  Medication Sig  . albuterol (PROVENTIL HFA;VENTOLIN HFA) 108 (90 Base) MCG/ACT inhaler Inhale 2 puffs into the lungs every 6 (six) hours as needed for shortness of breath.  Marland Kitchen aspirin EC 81 MG tablet Take 81 mg by mouth daily.  . carvedilol (COREG) 12.5 MG tablet Take 1 tablet (12.5 mg total) by mouth 2 (two) times daily with a meal.  . collagenase (SANTYL) ointment Apply 1 application topically as needed (for wound care). Apply to wound BLE per Tx  . lisinopril (PRINIVIL,ZESTRIL) 2.5 MG tablet Take 1 tablet (2.5 mg total) by mouth daily.  Marland Kitchen loratadine (CLARITIN) 10 MG tablet Take 10 mg by mouth daily.  . nitrofurantoin, macrocrystal-monohydrate, (MACROBID) 100 MG capsule Take 100 mg by mouth 2 (two) times daily. Take 1 capsule by mouth  twice daily for 5 days (started 08/08/17)  . ondansetron (ZOFRAN) 4 MG tablet Take 1 tablet (4 mg total) by mouth every 6 (six) hours as needed for nausea.  . potassium chloride SA (KLOR-CON M20) 20 MEQ tablet Take 2 tablets (40 mEq total) daily by mouth.  . torsemide (DEMADEX) 20 MG tablet Take 1 tablet (20 mg total) by  mouth daily.   Assessment:  56 year old female with CHF, Sarcoidosis, and lower extremity edema/lymphedema with impaired skin integrity.   Lower Extremity Edema/Lymphedema with Impaired Skin Integrity- Mrs. Dirocco has been receivingtwiceweekly dressing changes (Santyl >W>D) by her home health nurse and has been performing the daily dressing changes independently on days her nurse does not come.Her home health nurse has already visited today and her dressings are dry/intact. She has 2+ pretibial edema and palpable edema to just below the knees.   Ms. Stanhope has an appointment at the [facility] 12/23/17 @ 1:30pm. I also asked that she be put on the call list for cancellations. Ms. Hammer is aware of this appointment and it is on her calendar. She says she will have transportation.   CHF/Volume Management- [name] her cardiologist in the officeon Monday, 11.26.18.Labs were drawn and Ms. Varkey's lasix was subsequently discontinued and she was started on Demadex which she has been taking since 11/19/17. She has been taking Demadex as prescribed and described a dramatic uptick in urinary frequency. She denies having lightheadedness or dizziness and has no further report of hypotension (lowest recorded 94/60). Her abdomen is not distended and her breath sounds are clear with O2Saturation of 98%.    Daily Weights (patient's home digital scale):  12/05/17: 292lb 11/26/17: 290lb 11/20/17: 291lb 11/19/17:295lb 11/13/17: 295lb 10/29/17: 211HE 10/28/17: 268lb 10/24/17: 272lb  Ms. Holdman has a cardiology provider appointment today at 2pm.   DME Needs - I helped Ms. Freda replace the wireless adapter on her PPM Transmitter. We performed a transmission. I will follow up with the cardiology office re: receipt of transmission.    Plan:   Ms. Manton will change her own dressings on days her [facility] or I do not visit. She has demonstrated proper  technique for me.   Ms. Kruczek will attend her [facility] appointment on 12/23/17 or sooner if a cancellation permits an earlier appointment.   [facility] CM Care Plan Problem One     Most Recent Value  Care Plan Problem One  Knowledge Deficits re: Long term management/plan for lower extremity venous stasis ulcers/skin care  Role Documenting the Problem One  Care Management [place] for Problem One  Active  THN Long Term Goal   Over the next 31 days, patient will verbalize understanding of long term plan for treatment of skin condition/venous stasis ulcers BILE  THN Long Term Goal Start Date  12/05/17  Interventions for Problem One Long Term Goal  reviewed current plan of care for management of BLE venous stasis ulcers  [facility]N CM Short Term Goal #1   Over the next 30 days patient will work with [facility] to coordinate schedule for dressing changes and assessments  THN CM Short Term Goal #1 Start Date  12/05/17  Interventions for Short Term Goal #1  reviewed current dressing change schedule/ HHRN plan  THN CM Short Term Goal #2   Over the next 30 days, patient will attend upcoming wound care center appointment as scheduled  [facility] CM Short Term Goal #2 Start Date  12/05/17  Interventions  for Short Term Goal #2  reviewed upcoming appointment details,  ensured that patient has transportation  [facility] CM Short Term Goal #3  Over the next 30 days, patient will report new or worsened problems related to skin condition   THN CM Short Term Goal #3 Start Date  12/05/17  Interventions for Short Tern Goal #3  reviewed signs and symptoms of worsening skin condition warranting a call to nurse or provider    [facility] CM Care Plan Problem Two     Most Recent Value  Care Plan Problem Two  Volume Management Concerns (Peripheral Edema/Swelling of bilareral LE)   Role Documenting the Problem Two  Care Management Coordinator  Care Plan for Problem Two  Active  Interventions for Problem Two Long Term Goal    reviewed current plan of care, medications, and provider appointment schedule  THN Long Term Goal  Over the next 31 days, patient will verbalize improved condition of volume status  THN Long Term Goal Start Date  12/05/17  [facility]N CM Short Term Goal #1   Over the next 30 days, patient will weigh daily, record, and notify provider for weight gain of 3# overnight or 5# in a week  [facility]N CM Short Term Goal #1 Start Date  12/05/17  Interventions for Short Term Goal #2   discussed current weight status and plans for ongoing close monitoring  THN CM Short Term Goal #2   Over the next 30 days, patient will rerport sigsn and syptoms of worsening peripheral edema/lymphedema  THN CM Short Term Goal #2 Start Date  12/05/17  Interventions for Short Term Goal #2  reviewed worsening sings/symptoms of lymphedema  THN CM Short Term Goal #3   Over the next 30 days, patient will work with [facility] and [facility] to structure visit schedule for the purpose of assistance with wound assessment and dressinsg changes  THN CM Short Term Goal #3 Start Date  11/12/17  [facility] CM Short Term Goal #3 Met Date  12/05/17  THN CM Short Term Goal #4  Over the next 30 days, patient will see provider in wound care clinic for ongoing skin care and wound management needs  [facility] CM Short Term Goal #4 Start Date  11/25/17  Interventions of Short Term Goal #4  reviewed provider appointment schedule      [facility] [facility] Care Management  [phone]

## 2017-12-05 NOTE — Patient Instructions (Addendum)
Medication Instructions:  Your physician has recommended you make the following change in your medication: Stop Taking Ibuprofen  Start Tylenol 650 mg Two Times Daily as needed for pain    Labwork: Your physician recommends that you return for lab work in: Today    Testing/Procedures: NONE   Follow-Up: Your physician recommends that you schedule a follow-up appointment in: 3-4 Weeks    Any Other Special Instructions Will Be Listed Below (If Applicable). Use your leg machine for 2 hours daily.     If you need a refill on your cardiac medications before your next appointment, please call your pharmacy.  Thank you for choosing [facility]!

## 2017-12-06 ENCOUNTER — Telehealth: Payer: Self-pay | Admitting: Student

## 2017-12-06 NOTE — Telephone Encounter (Signed)
Called patient. No answer. Left message to call back.  

## 2017-12-06 NOTE — Telephone Encounter (Signed)
Pt notified that Tylenol 650 mg is over the counter and according to [employer] will not cover.

## 2017-12-06 NOTE — Telephone Encounter (Signed)
Patient states she is calling for results and that she was told she was gonna have pain medicine called in at [facility] yesterday but stated it was not called in. / tg

## 2017-12-09 ENCOUNTER — Other Ambulatory Visit: Payer: Self-pay | Admitting: *Deleted

## 2017-12-09 DIAGNOSIS — I5041 Acute combined systolic (congestive) and diastolic (congestive) heart failure: Secondary | ICD-10-CM | POA: Diagnosis not present

## 2017-12-09 DIAGNOSIS — N183 Chronic kidney disease, stage 3 (moderate): Secondary | ICD-10-CM | POA: Diagnosis not present

## 2017-12-09 DIAGNOSIS — L97822 Non-pressure chronic ulcer of other part of left lower leg with fat layer exposed: Secondary | ICD-10-CM | POA: Diagnosis not present

## 2017-12-09 DIAGNOSIS — I872 Venous insufficiency (chronic) (peripheral): Secondary | ICD-10-CM | POA: Diagnosis not present

## 2017-12-09 DIAGNOSIS — L97222 Non-pressure chronic ulcer of left calf with fat layer exposed: Secondary | ICD-10-CM | POA: Diagnosis not present

## 2017-12-09 DIAGNOSIS — I130 Hypertensive heart and chronic kidney disease with heart failure and stage 1 through stage 4 chronic kidney disease, or unspecified chronic kidney disease: Secondary | ICD-10-CM | POA: Diagnosis not present

## 2017-12-09 NOTE — Patient Outreach (Signed)
[facility]) Care Management   12/09/2017  Kari [name] 1961-08-27 [id number]  Kari [name] is an 56 y.o. female with a hx of hypertension, sarcoidosis, hyperlipidemia, mixed CHF with most recent echocardiogram revealing EF of 40%-45%, nonischemic cardiomyopathy, history of V. fib arrest requiring defibrillation, status post ICD implantation on 07/05/2017.   Kari [name] admitted to the hospitalon 07/20/2017 with dehydration, AKI, and hypotension. Kari [name] has been admitted to the [name] 3 times in the last 6 months. After her hospitalization, I followed Kari [name] at home for transition of care services and sheprogressed very well with the careful attention of home health and her own high level engagement in her own care. We were prepared to transition Kari [name] to telephonic case management until she started having volume management problems and experienced worsening of her lower extremity lymphedema and skin wound on her left lower leg.   I am visiting Kari [name] at home today to follow up on volume management and BLE venous stasis ulcers and skin care/condition.   Subjective: "If I can just get my legs right, I'll be okay"   Objective:  BP 132/78   Pulse 65   Temp (!) 96.9 F (36.1 C)   LMP 11/21/2011   SpO2 99%   Review of Systems  Constitutional: Negative.        Denies fever but complains of "hot flashes" at night after she takes her evening dose of Carvedilol  HENT: Negative.   Eyes: Negative.   Respiratory: Negative.  Negative for cough, sputum production, shortness of breath and wheezing.   Cardiovascular: Positive for leg swelling. Negative for chest pain and palpitations.       1+ edema BLE mid calf > pre-tibial  Gastrointestinal: Negative.   Genitourinary: Negative.   Musculoskeletal: Positive for myalgias. Negative for falls.  Skin:       See detailed note in physical exam  Neurological: Negative.     Psychiatric/Behavioral: Negative.     Physical Exam  Constitutional: She is oriented to person, place, and time. She appears well-developed and well-nourished.  Respiratory: Effort normal and breath sounds normal. She has no wheezes. She has no rales.  GI: Soft. Bowel sounds are normal. She exhibits no distension.  Neurological: She is alert and oriented to person, place, and time.  Skin: Skin is warm.  Psychiatric: She has a normal mood and affect. Her behavior is normal. Judgment and thought content normal.    Encounter Medications:   Outpatient Encounter Medications as of 12/09/2017  Medication Sig  . acetaminophen (TYLENOL 8 HOUR) 650 MG CR tablet Take 1 tablet (650 mg total) by mouth 2 (two) times daily.  Marland Kitchen albuterol (PROVENTIL HFA;VENTOLIN HFA) 108 (90 Base) MCG/ACT inhaler Inhale 2 puffs into the lungs every 6 (six) hours as needed for shortness of breath.  Marland Kitchen aspirin EC 81 MG tablet Take 81 mg by mouth daily.  . carvedilol (COREG) 12.5 MG tablet Take 1 tablet (12.5 mg total) by mouth 2 (two) times daily with a meal.  . collagenase (SANTYL) ointment Apply 1 application topically as needed (for wound care). Apply to wound BLE per Tx  . loratadine (CLARITIN) 10 MG tablet Take 10 mg by mouth daily.  . nitrofurantoin, macrocrystal-monohydrate, (MACROBID) 100 MG capsule Take 100 mg by mouth 2 (two) times daily. Take 1 capsule by mouth twice daily for 5 days (started 08/08/17)  . ondansetron (ZOFRAN) 4 MG tablet Take 1 tablet (4 mg total) by mouth every 6 (six) hours  as needed for nausea.  . potassium chloride SA (KLOR-CON M20) 20 MEQ tablet Take 2 tablets (40 mEq total) daily by mouth.  . torsemide (DEMADEX) 20 MG tablet Take 1 tablet (20 mg total) by mouth daily.  Marland Kitchen lisinopril (PRINIVIL,ZESTRIL) 2.5 MG tablet Take 1 tablet (2.5 mg total) by mouth daily.   Assessment: 56 year old female with CHF, Sarcoidosis, and lower extremity edema/lymphedema with impaired skin integrity.   Lower  Extremity Edema/Lymphedema with Impaired Skin Integrity- Kari [name] has been receivingtwiceweekly dressing changes (Santyl >W>D) by her home health nurse and has been performing the daily dressing changes independently on days her nurse does not come.Her home health nurse has already visited today and her dressings are dry/intact. She has 1+ pretibial edema and palpable edema to just below the knees.   Kari [name]'s wounds do not appear to be improving based on my last assessment. The leg wounds are mildly malodorous and wider in diameter.   Left Leg 11/21/17                                                Left Leg 12/11/17    Right Leg 11/21/17  \  Right Leg 12/11/17     Kari [name] has an appointment at the [facility] 12/23/17 @ 1:30pm. I also asked that she be put on the call list for cancellations. Kari [name] is aware of this appointment and it is on her calendar. She says she will have transportation.  CHF/Volume Management- Kari [name] lasix was discontinued and she was started on Demadex which she has been taking since 11/19/17.She has been taking Demadex as prescribed and described a dramatic uptick in urinary frequency. She denies having lightheadedness or dizziness and has no further report of hypotension (lowest recorded 94/60). Her abdomen is not distended and her breath sounds are clear with O2Saturation of 98%.   Daily Weights (patient's home digital scale):  12/09/17: 293lb 12/05/17: 292lb 11/26/17: 290lb 11/20/17: 291lb 11/19/17:295lb 11/13/17: 295lb 10/29/17: 832PQ 10/28/17: 268lb 10/24/17: 272lb  Ms. Majid complains today of worsening cramping of her legs and hands   DME Needs - I helped Kari [name] replace the wireless adapter on her PPM Transmitter. We performed a transmission again today. I will follow up with the cardiology office re: receipt of transmission.   Kari [name] is requesting assistance  with getting a lift chair. I will pursue this on Thursday with her home care provider/DME company and with her provider.    Plan:   Kari [name] will change her own dressingson days her [name] or I do not visit.She has demonstrated proper technique for me.   Kari [name] will attend her [facility] appointment on 12/23/17 or sooner if a cancellation permits an earlier appointment.   I will follow up on DME needs on Thursday.   [facility] CM Care Plan Problem One     Most Recent Value  Care Plan Problem One  Knowledge Deficits re: Long term management/plan for lower extremity venous stasis ulcers/skin care  Role Documenting the Problem One  Care Management [place] for Problem One  Active  THN Long Term Goal   Over the next 31 days, patient will verbalize understanding of long term plan for treatment of skin condition/venous stasis ulcers BILE  THN Long Term Goal Start Date  12/05/17  Interventions for Problem One Long Term Goal  collaboration with PCP office and wound care center re: current wound status and plan of care,  reviewed with patient,  provided updated images to PCP and wound care center  [facility] CM Short Term Goal #1   Over the next 30 days patient will work with [name] to coordinate schedule for dressing changes and assessments  THN CM Short Term Goal #1 Start Date  12/05/17  Interventions for Short Term Goal #1  in person collaboration with [name] and patient re: dressing change schedule  THN CM Short Term Goal #2   Over the next 30 days, patient will attend upcoming wound care center appointment as scheduled  THN CM Short Term Goal #2 Start Date  12/05/17  Interventions for Short Term Goal #2  updated wound care center with  images/notes re: patient progress,  put upcoming appointment on patient calendar,  confirmed transportation  [facility] CM Short Term Goal #3  Over the next 30 days, patient will report new or worsened problems related to skin condition   THN CM Short  Term Goal #3 Start Date  12/05/17  Interventions for Short Tern Goal #3  discussed signs and symptoms which require outreach to provider    [name] CM Care Plan Problem Two     Most Recent Value  Care Plan Problem Two  Volume Management Concerns (Peripheral Edema/Swelling of bilareral LE)   Role Documenting the Problem Two  Care Management [place] for Problem Two  Active  Interventions for Problem Two Long Term Goal   reviewed current plan of care, medications, and provider appointment schedule  THN Long Term Goal  Over the next 31 days, patient will verbalize improved condition of volume status  THN Long Term Goal Start Date  12/05/17  THN CM Short Term Goal #1   Over the next 30 days, patient will weigh daily, record, and notify provider for weight gain of 3# overnight or 5# in a week  THN CM Short Term Goal #1 Start Date  12/05/17  Interventions for Short Term Goal #2   discussed current weight status and plans for ongoing close monitoring  THN CM Short Term Goal #2   Over the next 30 days, patient will rerport sigsn and syptoms of worsening peripheral edema/lymphedema  THN CM Short Term Goal #2 Start Date  12/05/17  Interventions for Short Term Goal #2  reviewed worsening sings/symptoms of lymphedema  THN CM Short Term Goal #3   Over the next 30 days, patient will work with [facility] and [place] to structure visit schedule for the purpose of assistance with wound assessment and dressinsg changes  THN CM Short Term Goal #3 Start Date  11/12/17  [facility] CM Short Term Goal #3 Met Date  12/05/17  THN CM Short Term Goal #4  Over the next 30 days, patient will see provider in wound care clinic for ongoing skin care and wound management needs  THN CM Short Term Goal #4 Start Date  11/25/17  Interventions of Short Term Goal #4  reviewed provider appointment schedule    [name] CM Care Plan Problem Three     Most Recent Value  Care Plan Problem Three  Pain Management Concerns  Role Documenting  the Problem Three  Care Management Coordinator  Care Plan for Problem Three  Not Active  THN Long Term Goal   Over the next 31 days, patient will verbalize understanding of prescriber plan for management of short term  pain   THN Long Term Goal Start Date  11/12/17  [facility] Long Term Goal Met Date  12/05/17  Interventions for Problem Three Long Term Goal  discussed pain management and self care plan      [place] Care Management  [phone]

## 2017-12-11 ENCOUNTER — Emergency Department (HOSPITAL_COMMUNITY)
Admission: EM | Admit: 2017-12-11 | Discharge: 2017-12-12 | Disposition: A | Discharge status: Home / Self Care | Payer: Medicare Other | Attending: Emergency Medicine | Admitting: Emergency Medicine

## 2017-12-11 ENCOUNTER — Encounter (HOSPITAL_COMMUNITY): Payer: Self-pay | Admitting: *Deleted

## 2017-12-11 DIAGNOSIS — Z0130 Encounter for examination of blood pressure without abnormal findings: Secondary | ICD-10-CM

## 2017-12-11 DIAGNOSIS — I5032 Chronic diastolic (congestive) heart failure: Secondary | ICD-10-CM | POA: Insufficient documentation

## 2017-12-11 DIAGNOSIS — I110 Hypertensive heart disease with heart failure: Secondary | ICD-10-CM | POA: Insufficient documentation

## 2017-12-11 DIAGNOSIS — E785 Hyperlipidemia, unspecified: Secondary | ICD-10-CM | POA: Insufficient documentation

## 2017-12-11 DIAGNOSIS — J45909 Unspecified asthma, uncomplicated: Secondary | ICD-10-CM | POA: Diagnosis not present

## 2017-12-11 DIAGNOSIS — Z79899 Other long term (current) drug therapy: Secondary | ICD-10-CM | POA: Diagnosis not present

## 2017-12-11 DIAGNOSIS — Z7982 Long term (current) use of aspirin: Secondary | ICD-10-CM | POA: Diagnosis not present

## 2017-12-11 NOTE — ED Provider Notes (Signed)
[facility] EMERGENCY DEPARTMENT Provider Note   CSN: [id number] Arrival date & time: 12/11/17  2137     History   Chief Complaint Chief Complaint  Patient presents with  . Blood Pressure Evaluation    HPI Kari [name] is a 56 y.o. female.  Patient is a 56 year old female who presents to the emergency department to have her blood pressure rechecked.  The patient states she was using a home blood pressure testing device and the systolic was low at 85.  She called the nurse helpline, and was told to come to the emergency department for evaluation.  The patient denies any chest pain, palpitations, unusual shortness of breath, weakness, nausea, vomiting, sweats, or loss of consciousness during this time of testing her blood pressure.  She states that she was not having any problems.  But a family member was checking the blood pressure so she decided to check her b/p.   The history is provided by the patient.    Past Medical History:  Diagnosis Date  . Asthma   . CHF (congestive heart failure) ([place]) 03/12/2014   a. EF 40-45% by echo in 06/2017 with normal cors by cath. ICD placed following VT arrest  . Depression   . Headache(784.0)   . Hyperlipidemia   . Hypertension   . Lung nodules   . Renal disorder   . Sarcoidosis   . Ulcer    left posterior calf    Patient Active Problem List   Diagnosis Date Noted  . Mitral regurgitation 10/30/2017  . Acute kidney injury ([place]) 07/21/2017  . Generalized weakness 07/20/2017  . Shock circulatory ([place])   . Acute respiratory failure with hypoxia ([place])   . Pulmonary edema   . Cardiac arrest ([place]) 06/26/2017  . Ventricular fibrillation ([place])   . Acute on chronic diastolic heart failure ([place])   . Abnormal ECG   . Cellulitis 04/02/2017  . AKI (acute kidney injury) ([place]) 12/17/2016  . Cellulitis and abscess of right lower extremity 12/17/2016  . Syncope and collapse 12/17/2016  . Peripheral edema 12/17/2016  . Hypokalemia  12/17/2016  . Anemia 12/17/2016  . Acute diastolic CHF (congestive heart failure) ([place]) 03/14/2014  . CHF (congestive heart failure) ([place]) 03/12/2014  . Sarcoidosis ([place]) 03/12/2014  . Severe sepsis ([place]) 10/17/2013  . ARF (acute renal failure) ([place]) 10/08/2013  . Cellulitis and abscess 10/08/2013  . Morbid obesity ([place]) 10/08/2013  . Volume depletion 10/08/2013  . Unspecified venous (peripheral) insufficiency 10/28/2012  . Mediastinal lymphadenopathy 10/16/2012  . Pulmonary nodules 10/16/2012  . 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 Chest pain 09/02/2012  . Asthma   . Hypertension   . Depression   . Venous stasis ulcers ([place]) 07/29/2012  . Varicose veins of lower extremities with ulcer ([place]) 07/08/2012  . Venous ulcer of leg ([place]) 07/08/2012    Past Surgical History:  Procedure Laterality Date  . cataract surgery Left 07-2013  . ICD IMPLANT N/A 07/05/2017   Procedure: ICD Implant;  Surgeon: Constance Haw, MD;  Location: [place] CV [facility];  Service: Cardiovascular;  Laterality: N/A;  . LEFT HEART CATH AND CORONARY ANGIOGRAPHY N/A 07/03/2017   Procedure: Left Heart Cath and Coronary Angiography;  Surgeon: Belva Crome, MD;  Location: [place] CV LAB;  Service: Cardiovascular;  Laterality: N/A;  . NO PAST SURGERIES      OB History    No data available       Home Medications  Prior to Admission medications   Medication Sig Start Date End Date Taking? Authorizing Provider  acetaminophen (TYLENOL 8 HOUR) 650 MG CR tablet Take 1 tablet (650 mg total) by mouth 2 (two) times daily. 12/05/17   Strader, Fransisco Hertz, PA-C  albuterol (PROVENTIL HFA;VENTOLIN HFA) 108 (90 Base) MCG/ACT inhaler Inhale 2 puffs into the lungs every 6 (six) hours as needed for shortness of breath. 07/06/17   Cristal Ford, DO  aspirin EC 81 MG tablet Take 81 mg by mouth daily.    [provider]  carvedilol (COREG) 12.5 MG tablet  Take 1 tablet (12.5 mg total) by mouth 2 (two) times daily with a meal. 08/09/17   Lendon Colonel, NP  collagenase (SANTYL) ointment Apply 1 application topically as needed (for wound care). Apply to wound BLE per Tx 07/06/17   Cristal Ford, DO  lisinopril (PRINIVIL,ZESTRIL) 2.5 MG tablet Take 1 tablet (2.5 mg total) by mouth daily. 08/09/17 11/18/17  Lendon Colonel, NP  loratadine (CLARITIN) 10 MG tablet Take 10 mg by mouth daily.    [provider]  nitrofurantoin, macrocrystal-monohydrate, (MACROBID) 100 MG capsule Take 100 mg by mouth 2 (two) times daily. Take 1 capsule by mouth twice daily for 5 days (started 08/08/17)    [provider]  ondansetron (ZOFRAN) 4 MG tablet Take 1 tablet (4 mg total) by mouth every 6 (six) hours as needed for nausea. 04/06/17   Sinda Du, MD  potassium chloride SA (KLOR-CON M20) 20 MEQ tablet Take 2 tablets (40 mEq total) daily by mouth. 10/30/17 01/28/18  Ahmed Prima, Fransisco Hertz, PA-C  torsemide (DEMADEX) 20 MG tablet Take 1 tablet (20 mg total) by mouth daily. 11/19/17 02/17/18  Fay Records, MD    Family History Family History  Problem Relation Age of Onset  . Heart failure Mother   . Diabetes Mellitus II Mother   . Hypertension Mother   . Cancer Mother        unknown type  . Heart disease Father   . Stroke Father   . Diabetes Mellitus II Father     Social History Social History   Tobacco Use  . Smoking status: Never Smoker  . Smokeless tobacco: Never Used  Substance Use Topics  . Alcohol use: No  . Drug use: No     Allergies   Patient has no known allergies.   Review of Systems Review of Systems  Constitutional: Negative for activity change.       All ROS Neg except as noted in HPI  HENT: Negative for nosebleeds.   Eyes: Negative for photophobia and discharge.  Respiratory: Negative for cough, shortness of breath and wheezing.   Cardiovascular: Negative for chest pain and palpitations.  Gastrointestinal:  Negative for abdominal pain and blood in stool.  Genitourinary: Negative for dysuria, frequency and hematuria.  Musculoskeletal: Negative for arthralgias, back pain and neck pain.  Skin: Negative.   Neurological: Negative for dizziness, seizures and speech difficulty.  Psychiatric/Behavioral: Negative for confusion and hallucinations.     Physical Exam Updated Vital Signs BP (!) 123/58 Comment: Simultaneous filing. User may not have seen previous data.  Pulse 78   Temp 98.6 F (37 C) (Oral)   Resp 20   Ht 5\' 7"  (1.702 m)   Wt (!) 138.3 kg (305 lb)   LMP 11/21/2011   SpO2 100%   BMI 47.77 kg/m   Physical Exam  Constitutional: She is oriented to person, place, and time. She appears well-developed and well-nourished.  Non-toxic appearance.  HENT:  Head: Normocephalic.  Right Ear: Tympanic membrane and external ear normal.  Left Ear: Tympanic membrane and external ear normal.  Eyes: EOM and lids are normal. Pupils are equal, round, and reactive to light.  Neck: Normal range of motion. Neck supple. Carotid bruit is not present.  Cardiovascular: Normal rate, regular rhythm, intact distal pulses and normal pulses. Frequent extrasystoles are present.  Murmur heard.  Systolic murmur is present with a grade of 2/6. Pulmonary/Chest: Breath sounds normal. No respiratory distress.  Abdominal: Soft. Bowel sounds are normal. There is no tenderness. There is no guarding.  Musculoskeletal: Normal range of motion. She exhibits edema.  Lymphadenopathy:       Head (right side): No submandibular adenopathy present.       Head (left side): No submandibular adenopathy present.    She has no cervical adenopathy.  Neurological: She is alert and oriented to person, place, and time. She has normal strength. No cranial nerve deficit or sensory deficit.  Skin: Skin is warm and dry.  Psychiatric: She has a normal mood and affect. Her speech is normal.  Nursing note and vitals reviewed.    ED  Treatments / Results  Labs (all labs ordered are listed, but only abnormal results are displayed) Labs Reviewed - No data to display  EKG  EKG Interpretation None       Radiology No results found.  Procedures Procedures (including critical care time)  Medications Ordered in ED Medications - No data to display   Initial Impression / Assessment and Plan / ED Course  I have reviewed the triage vital signs and the nursing notes.  Pertinent labs & imaging results that were available during my care of the patient were reviewed by me and considered in my medical decision making (see chart for details).       Final Clinical Impressions(s) / ED Diagnoses MDM Patient was using a home testing device for blood pressure.  She got a low reading of 85 systolic.  She called the nurse helpline and was told to come to the emergency department.  Since being in the emergency department, her blood pressure has been elevated or within normal range.  There are no new or acute findings on the examination at this time.  Pulse oximetry is 100% on room air.  Within normal limits by my interpretation.  The patient denies any palpitations, chest pain, sweats, or any other complications.  Patient will be discharged home.  I have asked her to follow-up with her cardiology specialist.  She will return to the emergency department if any acute changes, problems, or concerns.   Final diagnoses:  Blood pressure check    ED Discharge Orders    None       Lily Kocher, PA-C 12/12/17 0002    Rolland Porter, MD 12/12/17 [phone]

## 2017-12-11 NOTE — ED Triage Notes (Signed)
Ambulatory to triage, steady gait. Pt checks her BP at home everyday. Today she says she checked her BP at home at is was a systolic of 89 and told by hotline nurse to come and get checked out. Denies any other symptoms like dizziness, sob, cp. Patient takes her medications as scheduled.

## 2017-12-11 NOTE — Discharge Instructions (Signed)
Your vital signs are in very good range.  Your oxygen level is 100% on room air.  No acute problems noted at this time.  Please follow-up with your cardiology specialist, or return to the emergency department if any changes or problems.

## 2017-12-12 ENCOUNTER — Other Ambulatory Visit: Payer: Self-pay | Admitting: *Deleted

## 2017-12-12 DIAGNOSIS — I872 Venous insufficiency (chronic) (peripheral): Secondary | ICD-10-CM | POA: Diagnosis not present

## 2017-12-12 DIAGNOSIS — L97822 Non-pressure chronic ulcer of other part of left lower leg with fat layer exposed: Secondary | ICD-10-CM | POA: Diagnosis not present

## 2017-12-12 DIAGNOSIS — I130 Hypertensive heart and chronic kidney disease with heart failure and stage 1 through stage 4 chronic kidney disease, or unspecified chronic kidney disease: Secondary | ICD-10-CM | POA: Diagnosis not present

## 2017-12-12 DIAGNOSIS — L97222 Non-pressure chronic ulcer of left calf with fat layer exposed: Secondary | ICD-10-CM | POA: Diagnosis not present

## 2017-12-12 DIAGNOSIS — I5041 Acute combined systolic (congestive) and diastolic (congestive) heart failure: Secondary | ICD-10-CM | POA: Diagnosis not present

## 2017-12-12 DIAGNOSIS — N183 Chronic kidney disease, stage 3 (moderate): Secondary | ICD-10-CM | POA: Diagnosis not present

## 2017-12-12 NOTE — Patient Outreach (Signed)
Kari [name]) Care Management  12/12/2017  Kari [name] 1961-01-08 [id number]  Telephone outreach to Kari [name] to follow up on her ED visit. She reports that the 24/7 On Call nurse told her to report to the ED when Kari [name] called her to report a BP finding at home of 85/57. Kari [name] was asymptomatic at the time. Once in the ED, Kari [name] was evaluated and her bp was found to be 123/58. However, it was noted that she had extra systoles on the cardiac monitor. No changes were made to Kari [name]'s plan of care or medications but it was recommended that she follow up with her cardiologist.   Plan: I will reach out to her cardiology office to request a follow up appointment for her.    [place] Management  [phone]

## 2017-12-18 ENCOUNTER — Other Ambulatory Visit: Payer: Self-pay | Admitting: *Deleted

## 2017-12-18 DIAGNOSIS — I872 Venous insufficiency (chronic) (peripheral): Secondary | ICD-10-CM | POA: Diagnosis not present

## 2017-12-18 DIAGNOSIS — I130 Hypertensive heart and chronic kidney disease with heart failure and stage 1 through stage 4 chronic kidney disease, or unspecified chronic kidney disease: Secondary | ICD-10-CM | POA: Diagnosis not present

## 2017-12-18 DIAGNOSIS — I5041 Acute combined systolic (congestive) and diastolic (congestive) heart failure: Secondary | ICD-10-CM | POA: Diagnosis not present

## 2017-12-18 DIAGNOSIS — N183 Chronic kidney disease, stage 3 (moderate): Secondary | ICD-10-CM | POA: Diagnosis not present

## 2017-12-18 DIAGNOSIS — L97822 Non-pressure chronic ulcer of other part of left lower leg with fat layer exposed: Secondary | ICD-10-CM | POA: Diagnosis not present

## 2017-12-18 DIAGNOSIS — L97222 Non-pressure chronic ulcer of left calf with fat layer exposed: Secondary | ICD-10-CM | POA: Diagnosis not present

## 2017-12-18 NOTE — Patient Outreach (Signed)
[facility]) Care Management  12/18/2017  Kari [name] 12/14/1961 [id number]  I reached out to Kari [name] today by phone to follow up on her volume management concerns and ongoing needs related to lymphedema and venous stasis ulcers of the lower extremities. She reports today that she weighed 300lb this morning and felt her swelling was improving. She also said that he legs are "about the same" and that she looks forward to going to the wound care center on Monday.  Kari [name] is currently visiting with her daughter in [place] but will be back home on Friday 12/20/17.   Plan: Kari [name] will continue her daily dressing changes and twice daily compression treatments. Kari [name] will take all medications as prescribed and will weigh herself daily, calling her cardiology provider if she gains 3# overnight or 5# in a week.   I will reach out to Kari [name] by phone next week after she has been to the [place].    [place] Management  [phone]

## 2017-12-23 DIAGNOSIS — Z8674 Personal history of sudden cardiac arrest: Secondary | ICD-10-CM | POA: Diagnosis not present

## 2017-12-23 DIAGNOSIS — Z4800 Encounter for change or removal of nonsurgical wound dressing: Secondary | ICD-10-CM | POA: Diagnosis not present

## 2017-12-23 DIAGNOSIS — F329 Major depressive disorder, single episode, unspecified: Secondary | ICD-10-CM | POA: Diagnosis not present

## 2017-12-23 DIAGNOSIS — L97222 Non-pressure chronic ulcer of left calf with fat layer exposed: Secondary | ICD-10-CM | POA: Diagnosis not present

## 2017-12-23 DIAGNOSIS — E876 Hypokalemia: Secondary | ICD-10-CM | POA: Diagnosis not present

## 2017-12-23 DIAGNOSIS — I872 Venous insufficiency (chronic) (peripheral): Secondary | ICD-10-CM | POA: Diagnosis not present

## 2017-12-23 DIAGNOSIS — I5041 Acute combined systolic (congestive) and diastolic (congestive) heart failure: Secondary | ICD-10-CM | POA: Diagnosis not present

## 2017-12-23 DIAGNOSIS — I130 Hypertensive heart and chronic kidney disease with heart failure and stage 1 through stage 4 chronic kidney disease, or unspecified chronic kidney disease: Secondary | ICD-10-CM | POA: Diagnosis not present

## 2017-12-23 DIAGNOSIS — N390 Urinary tract infection, site not specified: Secondary | ICD-10-CM | POA: Diagnosis not present

## 2017-12-23 DIAGNOSIS — D869 Sarcoidosis, unspecified: Secondary | ICD-10-CM | POA: Diagnosis not present

## 2017-12-23 DIAGNOSIS — J45909 Unspecified asthma, uncomplicated: Secondary | ICD-10-CM | POA: Diagnosis not present

## 2017-12-23 DIAGNOSIS — N183 Chronic kidney disease, stage 3 (moderate): Secondary | ICD-10-CM | POA: Diagnosis not present

## 2017-12-23 DIAGNOSIS — L97822 Non-pressure chronic ulcer of other part of left lower leg with fat layer exposed: Secondary | ICD-10-CM | POA: Diagnosis not present

## 2017-12-25 NOTE — Progress Notes (Signed)
Cardiology Office Note    Date:  12/26/2017   ID:  Kari [name], DOB 07-08-1961, MRN [id number]  PCP:  Rosita Fire, MD  Cardiologist: Dr. Harrington Challenger  Chief Complaint  Patient presents with  . Follow-up    3 week visit    History of Present Illness:    Kari [name] is a 57 y.o. female with past medical history of chronic combined systolic and diastolic CHF(EF 12-87% by echo in 06/2017), moderate MR, normal cors by cath in 06/2017, HTN, HLD, and history of VT arrest (s/p St Jude ICD placement in 06/2017) who presents to the office today for 3-week follow-up.   She was last examined by myself on 12/05/2017 and denied any recent chest discomfort, dyspnea on exertion, orthopnea, or PND. Her weight had increased up to 305 lbs and she reported only using her lymphedema compression machine for an hour per day. With her recent acute kidney injury, she was continued on her same dosing of Torsemide. A repeat BMET was obtained which showed her creatinine was trending down to 1.74.  She was advised to avoid NSAIDs as she had been taking Motrin regularly.   She did present to [facility] ED on 12/11/2017 after being found to have a systolic blood pressure of 85. The home health nursing line had advised she go to the ED, but she denied any symptoms with her low blood pressure. BP was improved to 123/58 while in the ED. She was continued on her current medication regimen at that time.  In talking with the patient today, she reports overall doing well since her last office visit. She has experienced significant improvement in her lower extremity wounds per her report and is no longer having active drainage. She is using her lymphedema compression machine for 2 hours per day.   She has been following her blood pressure since her recent emergency department visit and reports systolics have remained greater than 867 with diastolic readings in the 67'M to 80's. She denies any recent chest discomfort,  palpitations, orthopnea, PND, or lower extremity edema.   She does not weigh daily but reports weights have been variable from 301 lbs to 305 lbs on her home scales.    Past Medical History:  Diagnosis Date  . Asthma   . CHF (congestive heart failure) ([place]) 03/12/2014   a. EF 40-45% by echo in 06/2017 with normal cors by cath. ICD placed following VT arrest  . Depression   . Headache(784.0)   . Hyperlipidemia   . Hypertension   . Lung nodules   . Renal disorder   . Sarcoidosis   . Ulcer    left posterior calf    Past Surgical History:  Procedure Laterality Date  . cataract surgery Left 07-2013  . ICD IMPLANT N/A 07/05/2017   Procedure: ICD Implant;  Surgeon: Constance Haw, MD;  Location: [facility];  Service: Cardiovascular;  Laterality: N/A;  . LEFT HEART CATH AND CORONARY ANGIOGRAPHY N/A 07/03/2017   Procedure: Left Heart Cath and Coronary Angiography;  Surgeon: Belva Crome, MD;  Location: [facility];  Service: Cardiovascular;  Laterality: N/A;  . NO PAST SURGERIES      Current Medications: Outpatient Medications Prior to Visit  Medication Sig Dispense Refill  . acetaminophen (TYLENOL 8 HOUR) 650 MG CR tablet Take 1 tablet (650 mg total) by mouth 2 (two) times daily. 60 tablet 11  . albuterol (PROVENTIL HFA;VENTOLIN HFA) 108 (90 Base) MCG/ACT inhaler Inhale  2 puffs into the lungs every 6 (six) hours as needed for shortness of breath. 18 g 0  . aspirin EC 81 MG tablet Take 81 mg by mouth daily.    . collagenase (SANTYL) ointment Apply 1 application topically as needed (for wound care). Apply to wound BLE per Tx 15 g 0  . loratadine (CLARITIN) 10 MG tablet Take 10 mg by mouth daily.    . nitrofurantoin, macrocrystal-monohydrate, (MACROBID) 100 MG capsule Take 100 mg by mouth 2 (two) times daily. Take 1 capsule by mouth twice daily for 5 days (started 08/08/17)    . ondansetron (ZOFRAN) 4 MG tablet Take 1 tablet (4 mg total) by mouth every 6 (six) hours  as needed for nausea. 20 tablet 0  . carvedilol (COREG) 12.5 MG tablet Take 1 tablet (12.5 mg total) by mouth 2 (two) times daily with a meal. 180 tablet 3  . potassium chloride SA (KLOR-CON M20) 20 MEQ tablet Take 2 tablets (40 mEq total) daily by mouth. 60 tablet 6  . torsemide (DEMADEX) 20 MG tablet Take 1 tablet (20 mg total) by mouth daily. 90 tablet 1  . lisinopril (PRINIVIL,ZESTRIL) 2.5 MG tablet Take 1 tablet (2.5 mg total) by mouth daily. 90 tablet 3   No facility-administered medications prior to visit.      Allergies:   Patient has no known allergies.   Social History   Socioeconomic History  . Marital status: Single    Spouse name: None  . Number of children: 1  . Years of education: None  . Highest education level: None  Social Needs  . Financial resource strain: None  . Food insecurity - worry: None  . Food insecurity - inability: None  . Transportation needs - medical: None  . Transportation needs - non-medical: None  Occupational History  . None  Tobacco Use  . Smoking status: Never Smoker  . Smokeless tobacco: Never Used  Substance and Sexual Activity  . Alcohol use: No  . Drug use: No  . Sexual activity: Not Currently  Other Topics Concern  . None  Social History Narrative   Lives alone.     Family History:  The patient's family history includes Cancer in her mother; Diabetes Mellitus II in her father and mother; Heart disease in her father; Heart failure in her mother; Hypertension in her mother; Stroke in her father.   Review of Systems:   Please see the history of present illness.     General:  No chills, fever, night sweats or weight changes.  Cardiovascular:  No chest pain, dyspnea on exertion, orthopnea, palpitations, paroxysmal nocturnal dyspnea. Positive for lower extremity edema.  Dermatological: No rash, lesions/masses Respiratory: No cough, dyspnea Urologic: No hematuria, dysuria Abdominal:   No nausea, vomiting, diarrhea, bright red  blood per rectum, melena, or hematemesis Neurologic:  No visual changes, wkns, changes in mental status. All other systems reviewed and are otherwise negative except as noted above.   Physical Exam:    VS:  BP 112/72 (BP Location: Left Arm)   Pulse 87   Ht 5\' 7"  (1.702 m)   Wt (!) 306 lb (138.8 kg)   LMP 11/21/2011   SpO2 98%   BMI 47.93 kg/m    General: Well developed, obese African American female appearing in no acute distress. Head: Normocephalic, atraumatic, sclera non-icteric, no xanthomas, nares are without discharge.  Neck: No carotid bruits. JVD not elevated.  Lungs: Respirations regular and unlabored, without wheezes or rales.  Heart: Regular  rate and rhythm. No S3 or S4.  No murmur, no rubs, or gallops appreciated. Abdomen: Soft, non-tender, non-distended with normoactive bowel sounds. No hepatomegaly. No rebound/guarding. No obvious abdominal masses. Msk:  Strength and tone appear normal for age. No joint deformities or effusions. Extremities: No clubbing or cyanosis. Chronic edema with legs wrapped.  Distal pedal pulses are 2+ bilaterally. Neuro: Alert and oriented X 3. Moves all extremities spontaneously. No focal deficits noted. Psych:  Responds to questions appropriately with a normal affect. Skin: No rashes or lesions noted  Wt Readings from Last 3 Encounters:  12/26/17 (!) 306 lb (138.8 kg)  12/18/17 300 lb (136.1 kg)  12/11/17 (!) 305 lb (138.3 kg)      Studies/Labs Reviewed:   EKG:  EKG is not ordered today.   Recent Labs: 06/26/2017: TSH 9.201 07/01/2017: Magnesium 2.4 07/19/2017: ALT 14 10/30/2017: Hemoglobin 10.5; Platelets 203 11/18/2017: B Natriuretic Peptide 81.0 12/05/2017: BUN 51; Creatinine, Ser 1.74; Potassium 4.5; Sodium 133   Lipid Panel    Component Value Date/Time   CHOL 135 06/21/2012 0950   TRIG 59 06/21/2012 0950   HDL 54 06/21/2012 0950   CHOLHDL 2.5 06/21/2012 0950   VLDL 12 06/21/2012 0950   LDLCALC 69 06/21/2012 0950     Additional studies/ records that were reviewed today include:   Echocardiogram: 06/2017 Study Conclusions  - Left ventricle: Diffuse hypokinesis worse in the inferior basal   wall The cavity size was moderately dilated. Wall thickness was   increased in a pattern of moderate LVH. Systolic function was   mildly to moderately reduced. The estimated ejection fraction was   in the range of 40% to 45%. Left ventricular diastolic function   parameters were normal. - Aortic valve: There was mild regurgitation. - Mitral valve: There was moderate regurgitation. - Left atrium: The atrium was moderately dilated. - Atrial septum: No defect or patent foramen ovale was identified.  Assessment:    1. Chronic combined systolic (congestive) and diastolic (congestive) heart failure ([place])   2. Edema, unspecified type   3. Nonischemic cardiomyopathy ([place])   4. Essential hypertension   5. Mitral valve insufficiency, unspecified etiology   6. CKD (chronic kidney disease) stage 3, GFR 30-59 ml/min (HCC)      Plan:   In order of problems listed above:  1. Chronic Combined Systolic and Diastolic CHF/ Lower Extremity Edema - the patient has a known reduced EF of 40-45% by echo in 06/2017. - she continues to have chronic lower extremity edema but this has improved since she started using her  lymphedema compression machine for 2 hours per day. She denies any recent orthopnea, PND, or dyspnea on exertion.  - will continue on Torsemide 20mg  daily for diuresis. Continue Coreg 12.5mg  BID and Lisinopril 2.5mg  daily in the setting of her cardiomyopathy. Unable to further titrate given episodes of hypotension.   2. Nonischemic Cardiomyopathy - s/p placement of St. Jude ICD in 06/2017. Followed by Dr. Curt Bears  3. HTN - BP is well-controlled at 112/72 during today's visit. - continue current medication regimen.   4. Mitral Regurgitation - moderate by recent imaging. Continue to follow.   5. Stage 3  CKD - creatinine peaked at 1.82 in 11/2017, improved to 1.74 on most recent check. She has been avoiding NSAIDS since last visit. She declines repeat blood work today. Will plan for a BMET at the time of her next office visit.    Medication Adjustments/Labs and Tests Ordered: Current medicines are reviewed at  length with the patient today.  Concerns regarding medicines are outlined above.  Medication changes, Labs and Tests ordered today are listed in the Patient Instructions below. Patient Instructions  Medication Instructions:  Your physician recommends that you continue on your current medications as directed. Please refer to the Current Medication list given to you today.  Labwork: NONE   Testing/Procedures: NONE   Follow-Up: Your physician recommends that you schedule a follow-up appointment in: 4 Weeks    Any Other Special Instructions Will Be Listed Below (If Applicable).  If you need a refill on your cardiac medications before your next appointment, please call your pharmacy.  Thank you for choosing [facility]!   Signed, Erma Heritage, PA-C  12/26/2017 4:28 PM    [facility] [place], [place], [place]  [address] Phone: [phone]; Fax: [phone]  [address], [place], [place] [address] Phone: [phone]

## 2017-12-26 ENCOUNTER — Encounter: Payer: Self-pay | Admitting: Student

## 2017-12-26 ENCOUNTER — Ambulatory Visit (INDEPENDENT_AMBULATORY_CARE_PROVIDER_SITE_OTHER): Payer: Medicare Other | Admitting: Student

## 2017-12-26 VITALS — BP 112/72 | HR 87 | Ht 67.0 in | Wt 306.0 lb

## 2017-12-26 DIAGNOSIS — I340 Nonrheumatic mitral (valve) insufficiency: Secondary | ICD-10-CM

## 2017-12-26 DIAGNOSIS — N183 Chronic kidney disease, stage 3 unspecified: Secondary | ICD-10-CM

## 2017-12-26 DIAGNOSIS — I428 Other cardiomyopathies: Secondary | ICD-10-CM | POA: Diagnosis not present

## 2017-12-26 DIAGNOSIS — I5042 Chronic combined systolic (congestive) and diastolic (congestive) heart failure: Secondary | ICD-10-CM | POA: Diagnosis not present

## 2017-12-26 DIAGNOSIS — R609 Edema, unspecified: Secondary | ICD-10-CM

## 2017-12-26 DIAGNOSIS — I10 Essential (primary) hypertension: Secondary | ICD-10-CM

## 2017-12-26 MED ORDER — POTASSIUM CHLORIDE CRYS ER 20 MEQ PO TBCR: 40.0000 meq | EXTENDED_RELEASE_TABLET | Freq: Every day | ORAL | 3 refills | Status: DC

## 2017-12-26 MED ORDER — LISINOPRIL 2.5 MG PO TABS: 2.5000 mg | ORAL_TABLET | Freq: Every day | ORAL | 3 refills | Status: DC

## 2017-12-26 MED ORDER — TORSEMIDE 20 MG PO TABS: 20.0000 mg | ORAL_TABLET | Freq: Every day | ORAL | 3 refills | Status: DC

## 2017-12-26 MED ORDER — CARVEDILOL 12.5 MG PO TABS: 12.5000 mg | ORAL_TABLET | Freq: Two times a day (BID) | ORAL | 3 refills | Status: DC

## 2017-12-26 NOTE — Patient Instructions (Signed)
Medication Instructions:  Your physician recommends that you continue on your current medications as directed. Please refer to the Current Medication list given to you today.   Labwork: NONE   Testing/Procedures: NONE   Follow-Up: Your physician recommends that you schedule a follow-up appointment in: 4 Weeks    Any Other Special Instructions Will Be Listed Below (If Applicable).     If you need a refill on your cardiac medications before your next appointment, please call your pharmacy.  Thank you for choosing [facility]!

## 2017-12-27 DIAGNOSIS — I872 Venous insufficiency (chronic) (peripheral): Secondary | ICD-10-CM | POA: Diagnosis not present

## 2017-12-27 DIAGNOSIS — L97822 Non-pressure chronic ulcer of other part of left lower leg with fat layer exposed: Secondary | ICD-10-CM | POA: Diagnosis not present

## 2017-12-27 DIAGNOSIS — I130 Hypertensive heart and chronic kidney disease with heart failure and stage 1 through stage 4 chronic kidney disease, or unspecified chronic kidney disease: Secondary | ICD-10-CM | POA: Diagnosis not present

## 2017-12-27 DIAGNOSIS — N183 Chronic kidney disease, stage 3 (moderate): Secondary | ICD-10-CM | POA: Diagnosis not present

## 2017-12-27 DIAGNOSIS — I5041 Acute combined systolic (congestive) and diastolic (congestive) heart failure: Secondary | ICD-10-CM | POA: Diagnosis not present

## 2017-12-27 DIAGNOSIS — L97222 Non-pressure chronic ulcer of left calf with fat layer exposed: Secondary | ICD-10-CM | POA: Diagnosis not present

## 2017-12-31 DIAGNOSIS — L97822 Non-pressure chronic ulcer of other part of left lower leg with fat layer exposed: Secondary | ICD-10-CM | POA: Diagnosis not present

## 2017-12-31 DIAGNOSIS — I130 Hypertensive heart and chronic kidney disease with heart failure and stage 1 through stage 4 chronic kidney disease, or unspecified chronic kidney disease: Secondary | ICD-10-CM | POA: Diagnosis not present

## 2017-12-31 DIAGNOSIS — I872 Venous insufficiency (chronic) (peripheral): Secondary | ICD-10-CM | POA: Diagnosis not present

## 2017-12-31 DIAGNOSIS — L97222 Non-pressure chronic ulcer of left calf with fat layer exposed: Secondary | ICD-10-CM | POA: Diagnosis not present

## 2017-12-31 DIAGNOSIS — I5041 Acute combined systolic (congestive) and diastolic (congestive) heart failure: Secondary | ICD-10-CM | POA: Diagnosis not present

## 2017-12-31 DIAGNOSIS — N183 Chronic kidney disease, stage 3 (moderate): Secondary | ICD-10-CM | POA: Diagnosis not present

## 2018-01-01 ENCOUNTER — Other Ambulatory Visit: Payer: Self-pay | Admitting: *Deleted

## 2018-01-01 NOTE — Patient Outreach (Signed)
[facility]) Care Management  01/01/2018  Kari [name] [birth year] [id number]  I spoke with [name] by phone today re: her chronic health conditions and progress towards case management goals:   Housing Concerns - [name] tells me today that she is going to have to leave her current apartment in [place] and has made plans to move to a boarding house in [place]. Her exact departure/move date is undetermined because of incoming inclement weather but she hopes to be moved into her new place by mid next week (week of 01/06/18). She is to call me with an exact move date.   Lower Extremity Edema/Lymphedema with Impaired Skin Integrity- [name] has been receivingtwiceweekly dressing changes (Santyl >W>D) by her home health nurse and has been performing the daily dressing changes independently on days her nurse does not come.  When I last visited, [name]'s wounds did not appear to be improving. Her wounds were mildly malodorous and wider in diameter. She was scheduled to go to her appointment at [facility] on 12/23/17 @ 1:30pm. However, she cancelled the appointment because she was still out of town helping her sister (see note above).   I discussed with [name] today that it is of the utmost importance for her to make and keep an appointment with the [facility]. She agreed to allow me to help her reschedule the appointment.   [name] will also need to have her home health service area changed to [facility] when she finds out the exact date of her move. I advised that she discuss this further with her home health nurse when she visits this week.   CHF/Volume Management- [name]'s lasix was discontinued and she was started on Demadex which she has been taking since 11/19/17.She has been taking Demadex as prescribed and described a dramatic uptick in urinary frequency. She continues to weigh herself daily and  record and can verbalize good understanding of signs and symptoms of volume overload and when to call for weight gain or worsened symptoms.   Daily Weights (patient's home digital scale):  12/26/17: 306lb 12/18/18: 300lb 12/09/17: 293lb 12/05/17: 292lb 11/26/17: 290lb 11/20/17: 291lb 11/19/17:295lb 11/13/17: 295lb 10/29/17: 093AT 10/28/17: 268lb 10/24/17: 272lb   DME Needs - [name] is requesting assistance with getting a lift chair. She has opted to wait on this process until she is moved into her new residence.   Plan: I will call [name] by next week if I've not heard from her by Wednesday.   I will help [name] reschedule an appointment with the [facility] when we are certain of her move date.    [facility]  [phone]

## 2018-01-02 DIAGNOSIS — I130 Hypertensive heart and chronic kidney disease with heart failure and stage 1 through stage 4 chronic kidney disease, or unspecified chronic kidney disease: Secondary | ICD-10-CM | POA: Diagnosis not present

## 2018-01-02 DIAGNOSIS — N183 Chronic kidney disease, stage 3 (moderate): Secondary | ICD-10-CM | POA: Diagnosis not present

## 2018-01-02 DIAGNOSIS — L97822 Non-pressure chronic ulcer of other part of left lower leg with fat layer exposed: Secondary | ICD-10-CM | POA: Diagnosis not present

## 2018-01-02 DIAGNOSIS — I872 Venous insufficiency (chronic) (peripheral): Secondary | ICD-10-CM | POA: Diagnosis not present

## 2018-01-02 DIAGNOSIS — L97222 Non-pressure chronic ulcer of left calf with fat layer exposed: Secondary | ICD-10-CM | POA: Diagnosis not present

## 2018-01-02 DIAGNOSIS — I5041 Acute combined systolic (congestive) and diastolic (congestive) heart failure: Secondary | ICD-10-CM | POA: Diagnosis not present

## 2018-01-06 ENCOUNTER — Other Ambulatory Visit: Payer: Self-pay | Admitting: *Deleted

## 2018-01-06 NOTE — Patient Outreach (Signed)
[facility]) Care Management  01/06/2018  HANEEN BERNALES 09/03/1961 [id number]   Call received from Ms. Blakney this morning to notify me that she has moved from [place] to [place] as of Friday, 01/03/18. Her new address has been updated in the medical record.   Ms. Shareef requested assistance with transfer of care and services from [place] to [place]. She requested the specific provider and service choices as outlined below:   [facility] contacted the main [facility] @ [phone] to notify them of Ms. Southgate's address change and to request transfer of her services to the [facility] office.   Prescriptions/Pharmacy FROM: [facility] TO: [facility] [phone] (spoke with Raquel Sarna who is to call [facility] to request transfer of Ms. Gaines's prescriptions today)   Primary Care FROM: Dr. Rosita Fire  TO: Family Medicine @ [facility]  [facility]; [phone])  Nephrology FROM: Dr. Jerrel Ivory ([facility])  TO: [facility]  [address] [place], [place] [address] Phone: [phone] Dr. Josephine Cables office will send referral as patient has never been seen in the office @ [facility] (only in hospital)  Podiatry  FROM: [facility] (Dr. Caprice Beaver)  TO: [facility] Alyse Low @ appointment desk) [address], [place] [address] Phone: [phone] New Patient Appointment: February 10, 2018 3:30pm **Ms. Kraszewski is to call [facility] to request medical records transfer**  Cardiology FROM: [facility] Karna Christmas @ appointment desk) (prior appointment: [facility] Pho 01/27/18 @ 1:30pm cancelled)  TO: [facility] ([facility] office) Appointment: [name] 01/27/18 @ 2:30pm  [facility] [address] [place], [place]   [id number]  [facility] [facility] Previously scheduled appointment for 01/07/18 1:30pm  Plan: I will see Ms. Forness for a face to face visit at her new address on Wednesday, 01/08/18 @ 9:30am and we will review and calendar the changes and appointments as outlined above.    [facility]  [phone]

## 2018-01-07 ENCOUNTER — Encounter (HOSPITAL_BASED_OUTPATIENT_CLINIC_OR_DEPARTMENT_OTHER): Payer: Medicare Other | Attending: Internal Medicine

## 2018-01-07 DIAGNOSIS — I872 Venous insufficiency (chronic) (peripheral): Secondary | ICD-10-CM | POA: Diagnosis not present

## 2018-01-07 DIAGNOSIS — N183 Chronic kidney disease, stage 3 (moderate): Secondary | ICD-10-CM | POA: Diagnosis not present

## 2018-01-07 DIAGNOSIS — I5041 Acute combined systolic (congestive) and diastolic (congestive) heart failure: Secondary | ICD-10-CM | POA: Diagnosis not present

## 2018-01-07 DIAGNOSIS — I130 Hypertensive heart and chronic kidney disease with heart failure and stage 1 through stage 4 chronic kidney disease, or unspecified chronic kidney disease: Secondary | ICD-10-CM | POA: Diagnosis not present

## 2018-01-07 DIAGNOSIS — L97822 Non-pressure chronic ulcer of other part of left lower leg with fat layer exposed: Secondary | ICD-10-CM | POA: Diagnosis not present

## 2018-01-07 DIAGNOSIS — L97222 Non-pressure chronic ulcer of left calf with fat layer exposed: Secondary | ICD-10-CM | POA: Diagnosis not present

## 2018-01-08 ENCOUNTER — Other Ambulatory Visit: Payer: Self-pay | Admitting: *Deleted

## 2018-01-08 NOTE — Patient Outreach (Signed)
[facility]) Care Management   01/08/2018  [place] 04-May-1961 [id number]  ANONA GIOVANNINI is an 57 y.o. female with a hx of hypertension, sarcoidosis, hyperlipidemia, mixed CHF with most recent echocardiogram revealing EF of 40%-45%, nonischemic cardiomyopathy, history of V. fib arrest requiring defibrillation, status post ICD implantation on 07/05/2017.   [name] admitted to the hospitalon 07/20/2017 with dehydration, AKI, and hypotension. Ms. Raczynski has been admitted to the hospital 3 times in the last 6 months. After her hospitalization, I followed Mrs. Nobel at home for transition of care services and sheprogressed very well with the careful attention of home health and her own high level engagement in her own care. We were prepared to transition Mrs. Berenson to telephonic case management until she started having volume management problems and experienced worsening of her lower extremity lymphedema and skin wound on her left lower leg.   Subjective: "I'm so sad over my friend passing, I just don't know what to do. I need some help"  Objective:  BP 100/60   Pulse 78   LMP 11/21/2011   SpO2 98%   Review of Systems  Constitutional: Positive for malaise/fatigue.  HENT: Negative.   Eyes: Negative.   Respiratory: Negative.  Negative for cough, sputum production, shortness of breath and wheezing.   Cardiovascular: Positive for leg swelling. Negative for chest pain.       Trace - 1+ BLE edema  Gastrointestinal: Negative.  Negative for constipation and diarrhea.  Genitourinary: Negative.   Musculoskeletal: Positive for myalgias. Negative for falls.  Skin:       Multiple wounds to BLE; dressings dry and intact   Neurological: Negative.   Psychiatric/Behavioral: Positive for depression. Negative for memory loss, substance abuse and suicidal ideas. The patient has insomnia. The patient is not nervous/anxious.        Patient states she is grieving  the loss of her best friend who died unexpectedly over the weekend    Physical Exam  Constitutional: She is oriented to person, place, and time. Vital signs are normal. She appears well-developed and well-nourished. She is active. She does not have a sickly appearance. She does not appear ill.  Cardiovascular: Normal rate and regular rhythm.  Respiratory: Effort normal and breath sounds normal. She has no wheezes. She has no rhonchi. She has no rales.  GI: Soft. Bowel sounds are normal. She exhibits no distension. There is no tenderness.  Neurological: She is alert and oriented to person, place, and time.  Skin: Skin is warm and dry.     Psychiatric: Her speech is normal and behavior is normal. Judgment and thought content normal. Cognition and memory are normal. She exhibits a depressed mood.    Encounter Medications:   Outpatient Encounter Medications as of 01/08/2018  Medication Sig  . acetaminophen (TYLENOL 8 HOUR) 650 MG CR tablet Take 1 tablet (650 mg total) by mouth 2 (two) times daily.  Marland Kitchen albuterol (PROVENTIL HFA;VENTOLIN HFA) 108 (90 Base) MCG/ACT inhaler Inhale 2 puffs into the lungs every 6 (six) hours as needed for shortness of breath.  Marland Kitchen aspirin EC 81 MG tablet Take 81 mg by mouth daily.  . carvedilol (COREG) 12.5 MG tablet Take 1 tablet (12.5 mg total) by mouth 2 (two) times daily with a meal.  . collagenase (SANTYL) ointment Apply 1 application topically as needed (for wound care). Apply to wound BLE per Tx  . lisinopril (PRINIVIL,ZESTRIL) 2.5 MG tablet Take 1 tablet (2.5 mg total) by mouth daily.  Marland Kitchen  loratadine (CLARITIN) 10 MG tablet Take 10 mg by mouth daily.  . nitrofurantoin, macrocrystal-monohydrate, (MACROBID) 100 MG capsule Take 100 mg by mouth 2 (two) times daily. Take 1 capsule by mouth twice daily for 5 days (started 08/08/17)  . ondansetron (ZOFRAN) 4 MG tablet Take 1 tablet (4 mg total) by mouth every 6 (six) hours as needed for nausea.  . potassium chloride SA  (KLOR-CON M20) 20 MEQ tablet Take 2 tablets (40 mEq total) by mouth daily.  Marland Kitchen torsemide (DEMADEX) 20 MG tablet Take 1 tablet (20 mg total) by mouth daily.   Assessment:  57 year old female with multiple chronic medical conditions including hypertension, sarcoidosis, hyperlipidemia, mixed CHF, and vascular disease with venous stasis ulcers. Patient recently moved from [place] to [place] and is currently living in a boarding house where her sister also resides. The patient states today that her greatest need is dealing with grief over the death of her friend who died unexpectedly over the weekend.   Grief - Ms. Millirons's best friend died unexpectedly over the weekend. She saw her friend at the hospital on April 11, 2023 evening and her friend died Apr 12, 2023 morning. Ms. Winslett states she is "in grief" and was very tearful and cried most of the time I was visiting with her today. She stated "I know I have to see a Social worker. I can't do this by myself."   I referred Ms. Hauk to social work and discussed her needs with Jeananne Rama, [facility] social worker who is familiar with Ms. Jacobs from previous engagement.   Housing and Transition - As noted, Ms. Hamblin moved to her new address last week. She is in transition and trying to get reconnected to area services. I discussed these needs today with Ms. Danella Sensing and with Raynaldo Opitz LCSW.   Needs:  Transfer of Medicaid Benefits from [facility] to [facility] - Ms. Tatlock provided the following phone number as a contact person at [place] but couldn't remember the name although she thinks the last name might be "[place]" [phone].   CAP - Ms. Olarte is currently not receiving CAP services. She was previously being provided CAP services each day for 3 hours.   Transportation - Ms. Fesler wants to know how to get started with [facility] in [facility] for transportation to doctor appointments.   Caregiver Needs  - Ms. Formosa is currently caring for her 36 year old grandson. The child's mother, Ms. Heatley's daughter, currently resides in [place], [place]. Ms. Kassa wishes to ask our LCSW for guidance re: his care.   Provider Appointments I provided Ms. Kates with a new [facility] CM Calendar and Documentation tool and wrote in her new appointments.   I reached out today to the following providers to determine if they've received referrals and are ready to schedule Ms. Danella Sensing for new patient appointments:   Primary Care FROM: Dr. Rosita Fire  TO: Family Medicine @ [facility]  [facility]; [phone])  Nephrology FROM: Dr. Jerrel Ivory ([facility])  TO: [facility]  [address] [place], [place] [address] Phone: [phone] Dr. Josephine Cables office will send referral as patient has never been seen in the office @ [place] (only in hospital)      Lower Extremity Edema/Lymphedema with Impaired Skin Integrity- Mrs. Raymond has been receivingtwiceweekly dressing changes (Santyl >W>D) by her home health nurse and has been performing the daily dressing changes independently on days her nurse does not  come.  When I last visited, Ms. Stollings's wounds did not appear to be improving. Her wounds were mildly malodorous and wider in diameter. She was scheduled to go to her appointment at [facility] on 12/23/17 @ 1:30pm. However, she cancelled the appointment because she was still out of town helping her sister who lost her job.    I discussed with Ms. Lessig upon her return that it was of the utmost importance for her to make and keep an appointment with the [facility]. She agreed to allow me to help her reschedule the appointment and we rescheduled the appointment for 01/07/18. Ms. Maulding cancelled that appointment and when I visited today she said "I'm just in grief. I feel paralyzed and I just couldn't go."  She said that the wound care center contact told her to return a call to them when she felt she could make and keep an appointment.    CHF/Volume Management- Ms. Dyk'slasix was discontinued and she was started on Demadex which she has been taking since 11/19/17.She has been taking Demadex as prescribed and described a dramatic uptick in urinary frequency. She continues to weigh herself daily and record and can verbalize good understanding of signs and symptoms of volume overload and when to call for weight gain or worsened symptoms.   Plan:  Ms. Liwanag will work with the [facility] CM team to address needs around her transition to a new location/city.   Ms. Adamson will continue to work with her home care team to address needs related to her venous stasis ulcers.   Ms. Tavenner will attend all new provider appointments as scheduled.   Ms. Hottinger will take all medications as prescribed.    [facility]  [phone]

## 2018-01-09 ENCOUNTER — Other Ambulatory Visit: Payer: Self-pay | Admitting: *Deleted

## 2018-01-09 NOTE — Patient Outreach (Signed)
[facility] [facility]) Care Management  01/09/2018  Kari [name] 1961-10-23 [id number]   CSW had received referral from [place], [name] on resources regarding her recent move from [place] to [place]. CSW called & spoke with patient to confirm initial home visit scheduled for tomorrow, Fri 1/18 at 10:45am. Full assessment & note to follow.    Raynaldo Opitz, LCSW [facility]  Clinical Social Worker cell #: [phone]

## 2018-01-10 ENCOUNTER — Other Ambulatory Visit: Payer: Self-pay | Admitting: *Deleted

## 2018-01-10 ENCOUNTER — Encounter: Payer: Self-pay | Admitting: *Deleted

## 2018-01-10 DIAGNOSIS — I130 Hypertensive heart and chronic kidney disease with heart failure and stage 1 through stage 4 chronic kidney disease, or unspecified chronic kidney disease: Secondary | ICD-10-CM | POA: Diagnosis not present

## 2018-01-10 DIAGNOSIS — L97822 Non-pressure chronic ulcer of other part of left lower leg with fat layer exposed: Secondary | ICD-10-CM | POA: Diagnosis not present

## 2018-01-10 DIAGNOSIS — I872 Venous insufficiency (chronic) (peripheral): Secondary | ICD-10-CM | POA: Diagnosis not present

## 2018-01-10 DIAGNOSIS — N183 Chronic kidney disease, stage 3 (moderate): Secondary | ICD-10-CM | POA: Diagnosis not present

## 2018-01-10 DIAGNOSIS — I5041 Acute combined systolic (congestive) and diastolic (congestive) heart failure: Secondary | ICD-10-CM | POA: Diagnosis not present

## 2018-01-10 DIAGNOSIS — L97222 Non-pressure chronic ulcer of left calf with fat layer exposed: Secondary | ICD-10-CM | POA: Diagnosis not present

## 2018-01-10 NOTE — Patient Outreach (Signed)
[place] [facility]) Care Management  [facility] Social Work  01/10/2018  ANNALIE WENNER [birth year] [id number]   Encounter Medications:  Outpatient Encounter Medications as of 01/10/2018  Medication Sig  . acetaminophen (TYLENOL 8 HOUR) 650 MG CR tablet Take 1 tablet (650 mg total) by mouth 2 (two) times daily.  Marland Kitchen albuterol (PROVENTIL HFA;VENTOLIN HFA) 108 (90 Base) MCG/ACT inhaler Inhale 2 puffs into the lungs every 6 (six) hours as needed for shortness of breath.  Marland Kitchen aspirin EC 81 MG tablet Take 81 mg by mouth daily.  . carvedilol (COREG) 12.5 MG tablet Take 1 tablet (12.5 mg total) by mouth 2 (two) times daily with a meal.  . collagenase (SANTYL) ointment Apply 1 application topically as needed (for wound care). Apply to wound BLE per Tx  . lisinopril (PRINIVIL,ZESTRIL) 2.5 MG tablet Take 1 tablet (2.5 mg total) by mouth daily.  Marland Kitchen loratadine (CLARITIN) 10 MG tablet Take 10 mg by mouth daily.  . nitrofurantoin, macrocrystal-monohydrate, (MACROBID) 100 MG capsule Take 100 mg by mouth 2 (two) times daily. Take 1 capsule by mouth twice daily for 5 days (started 08/08/17)  . ondansetron (ZOFRAN) 4 MG tablet Take 1 tablet (4 mg total) by mouth every 6 (six) hours as needed for nausea.  . potassium chloride SA (KLOR-CON M20) 20 MEQ tablet Take 2 tablets (40 mEq total) by mouth daily.  Marland Kitchen torsemide (DEMADEX) 20 MG tablet Take 1 tablet (20 mg total) by mouth daily.   No facility-administered encounter medications on file as of 01/10/2018.     Functional Status:  In your present state of health, do you have any difficulty performing the following activities: 01/10/2018 08/20/2017  Hearing? N N  Vision? N N  Difficulty concentrating or making decisions? N N  Walking or climbing stairs? Y Y  Dressing or bathing? N Y  Doing errands, shopping? N Y  Conservation officer, nature and eating ? - -  Using the Toilet? - -  In the past six months, have you accidently leaked urine? - -  Do you have problems  with loss of bowel control? - -  Managing your Medications? - -  Managing your Finances? - -  Housekeeping or managing your Housekeeping? - -  Some recent data might be hidden    Fall/Depression Screening:  PHQ 2/9 Scores 01/10/2018 08/09/2017 07/26/2017  PHQ - 2 Score 2 1 1   PHQ- 9 Score 2 - -    Assessment:   CSW had received referral from [place] that patient had recently moved from [place] to [facility] last week. Patient spoke with CSW about her friend that lived in the apartment across from her who passed away last week (2018-01-17) and her funeral is tomorrow. Patient states that she has been having a difficult time processing this loss. Patient also shared with CSW that her mother had passed away in 2013-03-30 and patient feels that she never really got a chance to process that grief either. CSW provided supportive listening and encouraged patient to contact [facility] for their grief counseling services, CSW provided brochure. Patient was relieved to know that counseling is free of charge, as she has financial hardships. Patient is now living in a boarding house [address] in [place] - her sister, Vaughan Basta lives in the boarding house behind her but they share the same landlord. CSW had patient sign new patient forms that [facility] RNCM, Alisa had completed & CSW will fax in to the office today along with  [facility] application that CSW assisted patient in completing.   Plan:   CSW will follow-up with patient in 2 weeks to ensure that [facility] has contacted patient for an intake interview and provided additional assistance with community resources.   [facility] CM Care Plan Problem One     Most Recent Value  Care Plan Problem One  Knowledge defecit re: resources in [facility] since move from [facility]  Role Documenting the Problem One  [place] for Problem One  Active  [facility] Long Term Goal   Over the next 60 days patient will be connected with  community resources in [place] as she was in [place] Start Date  01/10/18  Interventions for Problem One Long Term Goal  CSW assisted patient in completing SCAT application & faxed in request, CSW left message for CAP program re: waiting list  [facility] CM Short Term Goal #1   Over the next 31 days, patient will review information provided by CSW on [facility] Grief Counseling program  [facility] CM Short Term Goal #1 Start Date  01/10/18  Interventions for Short Term Goal #1  CSW encouraged patient to look over information and reach out for help,        Raynaldo Opitz, [place] Social Worker cell #: [phone]

## 2018-01-13 ENCOUNTER — Telehealth: Payer: Self-pay | Admitting: Cardiology

## 2018-01-13 ENCOUNTER — Ambulatory Visit (INDEPENDENT_AMBULATORY_CARE_PROVIDER_SITE_OTHER): Payer: Medicare Other | Admitting: *Deleted

## 2018-01-13 DIAGNOSIS — I428 Other cardiomyopathies: Secondary | ICD-10-CM | POA: Diagnosis not present

## 2018-01-13 NOTE — Telephone Encounter (Signed)
Spoke with pt and reminded pt of remote transmission that is due today. Pt verbalized understanding.   

## 2018-01-14 ENCOUNTER — Encounter: Payer: Self-pay | Admitting: Cardiology

## 2018-01-14 NOTE — Progress Notes (Signed)
Remote ICD transmission.   

## 2018-01-16 ENCOUNTER — Other Ambulatory Visit: Payer: Self-pay | Admitting: *Deleted

## 2018-01-16 NOTE — Patient Outreach (Signed)
[place] [facility]) Care Management  01/16/2018  Kari [name] [birth year] [id number]  I reached out to [name] today to follow up on her progress and needs/concerns since moving to [place]. She answered the phone and stated she had company and was eating lunch and asked if I would return a call to her next week.   Plan: I will reach out to [name] next week by phone.    [facility] [facility]  [phone]

## 2018-01-17 DIAGNOSIS — I130 Hypertensive heart and chronic kidney disease with heart failure and stage 1 through stage 4 chronic kidney disease, or unspecified chronic kidney disease: Secondary | ICD-10-CM | POA: Diagnosis not present

## 2018-01-17 DIAGNOSIS — L97822 Non-pressure chronic ulcer of other part of left lower leg with fat layer exposed: Secondary | ICD-10-CM | POA: Diagnosis not present

## 2018-01-17 DIAGNOSIS — I872 Venous insufficiency (chronic) (peripheral): Secondary | ICD-10-CM | POA: Diagnosis not present

## 2018-01-17 DIAGNOSIS — N183 Chronic kidney disease, stage 3 (moderate): Secondary | ICD-10-CM | POA: Diagnosis not present

## 2018-01-17 DIAGNOSIS — I5041 Acute combined systolic (congestive) and diastolic (congestive) heart failure: Secondary | ICD-10-CM | POA: Diagnosis not present

## 2018-01-17 DIAGNOSIS — L97222 Non-pressure chronic ulcer of left calf with fat layer exposed: Secondary | ICD-10-CM | POA: Diagnosis not present

## 2018-01-21 ENCOUNTER — Ambulatory Visit: Payer: Medicare Other | Admitting: Family Medicine

## 2018-01-21 ENCOUNTER — Other Ambulatory Visit: Payer: Self-pay | Admitting: *Deleted

## 2018-01-21 NOTE — Patient Outreach (Signed)
[facility]) Care Management  01/21/2018  KINDELL STRADA 1961/09/17 [id number]  Unable to reach Mrs. Kozlov by phone today.   Plan: I will follow up with Mrs. Gonet in February.    [facility]  [phone]

## 2018-01-23 DIAGNOSIS — I872 Venous insufficiency (chronic) (peripheral): Secondary | ICD-10-CM | POA: Diagnosis not present

## 2018-01-23 DIAGNOSIS — I5041 Acute combined systolic (congestive) and diastolic (congestive) heart failure: Secondary | ICD-10-CM | POA: Diagnosis not present

## 2018-01-23 DIAGNOSIS — I130 Hypertensive heart and chronic kidney disease with heart failure and stage 1 through stage 4 chronic kidney disease, or unspecified chronic kidney disease: Secondary | ICD-10-CM | POA: Diagnosis not present

## 2018-01-23 DIAGNOSIS — L97222 Non-pressure chronic ulcer of left calf with fat layer exposed: Secondary | ICD-10-CM | POA: Diagnosis not present

## 2018-01-23 DIAGNOSIS — N183 Chronic kidney disease, stage 3 (moderate): Secondary | ICD-10-CM | POA: Diagnosis not present

## 2018-01-23 DIAGNOSIS — L97822 Non-pressure chronic ulcer of other part of left lower leg with fat layer exposed: Secondary | ICD-10-CM | POA: Diagnosis not present

## 2018-01-24 ENCOUNTER — Other Ambulatory Visit: Payer: Self-pay | Admitting: *Deleted

## 2018-01-24 NOTE — Patient Outreach (Signed)
[facility]) Care Management  01/24/2018  Kari [name] July 30, 1961 [id number]   CSW attempted to reach patient to follow-up on community resources, but when CSW called patient (ph#: [phone]), message stating "call rejected" and hung up. CSW was unable to leave a voicemail but will try again in 1 week.    Raynaldo Opitz, LCSW [facility]  Clinical Social Worker cell #: [phone]

## 2018-01-26 NOTE — Progress Notes (Deleted)
Cardiology Office Note   Date:  01/26/2018   ID:  Kari [name], DOB 1961-07-17, MRN [id number]  PCP:  Rosita Fire, MD  Cardiologist:  Dr. Harrington Challenger  No chief complaint on file.    History of Present Illness: Kari [name] is a 57 y.o. 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 She was seen by Bernerd Pho, [place] on 12/26/2017. She complained of chronic LEE, and was to continue compression machine for lymphedema. No changes were made to her medications. HHN was continued.She is also seen by [facility] outreach. She is here for one month follow up.       Past Medical History:  Diagnosis Date  . Asthma   . CHF (congestive heart failure) ([place]) 03/12/2014   a. EF 40-45% by echo in 06/2017 with normal cors by cath. ICD placed following VT arrest  . Depression   . Headache(784.0)   . Hyperlipidemia   . Hypertension   . Lung nodules   . Renal disorder   . Sarcoidosis   . Ulcer    left posterior calf    Past Surgical History:  Procedure Laterality Date  . cataract surgery Left 07-2013  . ICD IMPLANT N/A 07/05/2017   Procedure: ICD Implant;  Surgeon: Constance Haw, MD;  Location: [facility] CV [facility];  Service: Cardiovascular;  Laterality: N/A;  . LEFT HEART CATH AND CORONARY ANGIOGRAPHY N/A 07/03/2017   Procedure: Left Heart Cath and Coronary Angiography;  Surgeon: Belva Crome, MD;  Location: [facility] CV [facility];  Service: Cardiovascular;  Laterality: N/A;  . NO PAST SURGERIES       Current Outpatient Medications  Medication Sig Dispense Refill  . acetaminophen (TYLENOL 8 HOUR) 650 MG CR tablet Take 1 tablet (650 mg total) by mouth 2 (two) times daily. 60 tablet 11  . albuterol (PROVENTIL HFA;VENTOLIN HFA) 108 (90 Base) MCG/ACT inhaler Inhale 2 puffs into the lungs every 6 (six) hours as  needed for shortness of breath. 18 g 0  . aspirin EC 81 MG tablet Take 81 mg by mouth daily.    . carvedilol (COREG) 12.5 MG tablet Take 1 tablet (12.5 mg total) by mouth 2 (two) times daily with a meal. 180 tablet 3  . collagenase (SANTYL) ointment Apply 1 application topically as needed (for wound care). Apply to wound BLE per Tx 15 g 0  . lisinopril (PRINIVIL,ZESTRIL) 2.5 MG tablet Take 1 tablet (2.5 mg total) by mouth daily. 90 tablet 3  . loratadine (CLARITIN) 10 MG tablet Take 10 mg by mouth daily.    . nitrofurantoin, macrocrystal-monohydrate, (MACROBID) 100 MG capsule Take 100 mg by mouth 2 (two) times daily. Take 1 capsule by mouth twice daily for 5 days (started 08/08/17)    . ondansetron (ZOFRAN) 4 MG tablet Take 1 tablet (4 mg total) by mouth every 6 (six) hours as needed for nausea. 20 tablet 0  . potassium chloride SA (KLOR-CON M20) 20 MEQ tablet Take 2 tablets (40 mEq total) by mouth daily. 180 tablet 3  . torsemide (DEMADEX) 20 MG tablet Take 1 tablet (20 mg total) by mouth daily. 90 tablet 3   No current facility-administered medications for this visit.     Allergies:   Patient has no known allergies.    Social History:  The patient  reports that  has never smoked. she has never used smokeless tobacco. She reports that she does not drink alcohol or use drugs.   Family History:  The patient's family history includes Cancer in her mother; Diabetes Mellitus II in her father and mother; Heart disease in her father; Heart failure in her mother; Hypertension in her mother; Stroke in her father.    ROS: All other systems are reviewed and negative. Unless otherwise mentioned in H&P    PHYSICAL EXAM: VS:  LMP 11/21/2011  , BMI There is no height or weight on file to calculate BMI. GEN: Well nourished, well developed, in no acute distress  HEENT: normal  Neck: no JVD, carotid bruits, or masses Cardiac: ***RRR; no murmurs, rubs, or gallops,no edema  Respiratory:  clear to  auscultation bilaterally, normal work of breathing GI: soft, nontender, nondistended, + BS MS: no deformity or atrophy  Skin: warm and dry, no rash Neuro:  Strength and sensation are intact Psych: euthymic mood, full affect   EKG:  EKG {ACTION; IS/IS [id number]} ordered today. The ekg ordered today demonstrates ***   Recent Labs: 06/26/2017: TSH 9.201 07/01/2017: Magnesium 2.4 07/19/2017: ALT 14 10/30/2017: Hemoglobin 10.5; Platelets 203 11/18/2017: B Natriuretic Peptide 81.0 12/05/2017: BUN 51; Creatinine, Ser 1.74; Potassium 4.5; Sodium 133    Lipid Panel    Component Value Date/Time   CHOL 135 06/21/2012 0950   TRIG 59 06/21/2012 0950   HDL 54 06/21/2012 0950   CHOLHDL 2.5 06/21/2012 0950   VLDL 12 06/21/2012 0950   LDLCALC 69 06/21/2012 0950      Wt Readings from Last 3 Encounters:  12/26/17 (!) 306 lb (138.8 kg)  12/18/17 300 lb (136.1 kg)  12/11/17 (!) 305 lb (138.3 kg)      Other studies Reviewed: Additional studies/ records that were reviewed today include: ***. Review of the above records demonstrates: ***   ASSESSMENT AND PLAN:  1.  ***   Current medicines are reviewed at length with the patient today.    Labs/ tests ordered today include: *** Phill Myron. [place], ANP, AACC   01/26/2018 7:55 PM    [facility] [address], [place], [place] [address] Phone: [phone]; Fax: [phone]

## 2018-01-27 ENCOUNTER — Ambulatory Visit: Payer: Medicare Other | Admitting: Adult Health

## 2018-01-27 ENCOUNTER — Ambulatory Visit: Payer: Medicare Other | Admitting: Student

## 2018-01-31 DIAGNOSIS — N183 Chronic kidney disease, stage 3 (moderate): Secondary | ICD-10-CM | POA: Diagnosis not present

## 2018-01-31 DIAGNOSIS — I5041 Acute combined systolic (congestive) and diastolic (congestive) heart failure: Secondary | ICD-10-CM | POA: Diagnosis not present

## 2018-01-31 DIAGNOSIS — L97822 Non-pressure chronic ulcer of other part of left lower leg with fat layer exposed: Secondary | ICD-10-CM | POA: Diagnosis not present

## 2018-01-31 DIAGNOSIS — I130 Hypertensive heart and chronic kidney disease with heart failure and stage 1 through stage 4 chronic kidney disease, or unspecified chronic kidney disease: Secondary | ICD-10-CM | POA: Diagnosis not present

## 2018-01-31 DIAGNOSIS — I872 Venous insufficiency (chronic) (peripheral): Secondary | ICD-10-CM | POA: Diagnosis not present

## 2018-01-31 DIAGNOSIS — L97222 Non-pressure chronic ulcer of left calf with fat layer exposed: Secondary | ICD-10-CM | POA: Diagnosis not present

## 2018-02-03 ENCOUNTER — Other Ambulatory Visit: Payer: Self-pay | Admitting: *Deleted

## 2018-02-03 NOTE — Patient Outreach (Addendum)
[facility]) Care Management  02/03/2018  Kari [name] November 08, 1961 [id number]   CSW spoke with patient to follow-up on community resources, but patient states that she couldn't talk much as she felt like she had come down with the flu and was awaiting an appointment from [place] to see a doctor. Patient informed CSW that she still has not heard anything from [facility]. CSW left message for [facility] as application was successfully faxed over on 01/14/18.    Raynaldo Opitz, LCSW [facility]  Clinical Social Worker cell #: [phone]

## 2018-02-04 ENCOUNTER — Ambulatory Visit: Payer: Medicare Other | Admitting: *Deleted

## 2018-02-05 ENCOUNTER — Ambulatory Visit: Payer: Medicare Other | Admitting: Podiatry

## 2018-02-05 LAB — CUP PACEART REMOTE DEVICE CHECK
Battery Remaining Longevity: 85 mo
Battery Remaining Percentage: 82 %
Battery Voltage: 3.01 V
Brady Statistic RV Percent Paced: 1 %
Date Time Interrogation Session: 20190121205121
HighPow Impedance: 70 Ohm
HighPow Impedance: 70 Ohm
Implantable Lead Implant Date: 20180713
Implantable Lead Location: 753860
Implantable Pulse Generator Implant Date: 20180713
Lead Channel Impedance Value: 380 Ohm
Lead Channel Pacing Threshold Amplitude: 0.5 V
Lead Channel Pacing Threshold Pulse Width: 0.5 ms
Lead Channel Sensing Intrinsic Amplitude: 6.1 mV
Lead Channel Setting Pacing Amplitude: 2.5 V
Lead Channel Setting Pacing Pulse Width: 0.5 ms
Lead Channel Setting Sensing Sensitivity: 0.5 mV
Pulse Gen Serial Number: 7302172

## 2018-02-06 ENCOUNTER — Other Ambulatory Visit: Payer: Self-pay | Admitting: *Deleted

## 2018-02-07 DIAGNOSIS — I872 Venous insufficiency (chronic) (peripheral): Secondary | ICD-10-CM | POA: Diagnosis not present

## 2018-02-07 DIAGNOSIS — L97222 Non-pressure chronic ulcer of left calf with fat layer exposed: Secondary | ICD-10-CM | POA: Diagnosis not present

## 2018-02-07 DIAGNOSIS — I5041 Acute combined systolic (congestive) and diastolic (congestive) heart failure: Secondary | ICD-10-CM | POA: Diagnosis not present

## 2018-02-07 DIAGNOSIS — L97822 Non-pressure chronic ulcer of other part of left lower leg with fat layer exposed: Secondary | ICD-10-CM | POA: Diagnosis not present

## 2018-02-07 DIAGNOSIS — N183 Chronic kidney disease, stage 3 (moderate): Secondary | ICD-10-CM | POA: Diagnosis not present

## 2018-02-07 DIAGNOSIS — I130 Hypertensive heart and chronic kidney disease with heart failure and stage 1 through stage 4 chronic kidney disease, or unspecified chronic kidney disease: Secondary | ICD-10-CM | POA: Diagnosis not present

## 2018-02-07 NOTE — Patient Outreach (Signed)
[facility]) Care Management  02/07/2018  [place] [birth year] [id number]  Kari [name] is an 57 y.o. female with a hx of hypertension, sarcoidosis, hyperlipidemia, mixed CHF with most recent echocardiogram revealing EF of 40%-45%, nonischemic cardiomyopathy, history of V. fib arrest requiring defibrillation, status post ICD implantation on 07/05/2017.   [facility] Care Management has been following Kari [name] in the community at first for Transition of Cares services after her hospital admission in July 2018 then for ongoing complex care management and care coordination needs. She is being followed by nursing and social work services. Over the last month, we have transitioned to telephonic nursing case management services as Kari [name] is progressing in disease management and self health management proficiency.   I spoke with Kari [name] by phone today and addressed the following needs:   Grief - Kari [name]'s best friend died unexpectedly recently. She is being followed by our LCSW who is providing support and referred Kari [name] to [place] for ongoing grief counseling.   Housing and Transition - Kari [name] moved to her new address and tells me today she is glad to be close to her sister and feels her new living situation is good.   Transportation - Kari [name] has been connected by our LCSW to [place] in [facility] for transportation to doctor appointments.   Primary Care - Kari [name] recently transferred primary care from Dr. Rosita Fire in [place] to [facility] [phone]). She is scheduled for her new patient appointment on 02/14/18.   Lower Extremity Edema/Lymphedema with Impaired Skin Integrity- Kari [name] has been receivingtwiceweekly dressing changes (Santyl >W>D) by her home health nurse and has been performing the daily dressing changes independently on days her nurse does not come.She feels her  legs are improving and says her home health nurse agrees.   Kari [name] has been scheduled on 3 separate occasions to be seen [facility](last on 12/23/17 @ 1:30pm).However, she cancelled the appointment because she was out of town helping her sister who lost her job.  At this time, Kari [name] says she doesn't feel she needs to go to the wound care center. I advised that she discuss this with her new provider at her appointment on 02/14/18.    CHF/Volume Management- Kari [name]'slasix was discontinued and she was started on Demadex which she has been taking since 11/19/17.She has been taking Demadex as prescribed and described a dramatic uptick in urinary frequency.She continues to weigh herself daily and record and can verbalize good understanding of signs and symptoms of volume overload and when to call for weight gain or worsened symptoms.She tells me today her weight is down to 295#.   Cardiology Appointment - Kari [name] will be staying with her cardiology provider group but changing to the [place] office. She has an appointment on 02/10/18 with Jory Sims.  Plan:  Kari [name] will continue to work with her home care team to address needs related to her venous stasis ulcers.   Kari [name] will attend all new provider appointments as scheduled.   Kari [name] will take all medications as prescribed and continue to perform self health assessments.   We will likely either transition Kari [name] to our telephonic case management team after she is well established with her new PCP if she wishes. If not and if all established goals are met in the next 2 weeks, we will discharge Kari [name] but be available to her in  the future should new needs arise.   [facility] CM Care Plan Problem One     Most Recent Value  Care Plan Problem One  Knowledge Deficits re: Long term management/plan for lower extremity venous stasis ulcers/skin care  Role Documenting  the Problem One  Care Management Coordinator  Care Plan for Problem One  Not Active  THN Long Term Goal   Over the next 31 days, patient will verbalize understanding of long term plan for treatment of skin condition/venous stasis ulcers BILE  THN Long Term Goal Start Date  01/08/18  THN Long Term Goal Met Date  02/06/18  Interventions for Problem One Long Term Goal  discussed current plan of care,  recommended discussing with new PCP on 02/14/18 appointment  THN CM Short Term Goal #1   Over the next 30 days patient will work with RNCM to coordinate schedule for dressing changes and assessments  THN CM Short Term Goal #1 Start Date  01/08/18  THN CM Short Term Goal #1 Met Date  02/06/18  Interventions for Short Term Goal #1  reviewed HHRN appointment schedule and dressing change schedule  THN CM Short Term Goal #2   Over the next 30 days, patient will attend upcoming wound care center appointment as scheduled  THN CM Short Term Goal #2 Start Date  01/01/18    THN CM Care Plan Problem Two     Most Recent Value  Care Plan Problem Two  Transition and Care Coordination Needs  Role Documenting the Problem Two  Care Management Coordinator  Care Plan for Problem Two  Active  Interventions for Problem Two Long Term Goal   reviewed patient current living status and plans  THN Long Term Goal  Over the next 60 days, patient will verbalize finalization of transfer of clinical and social services needed to change to permanent residence in new city  THN Long Term Goal Start Date  01/08/18  THN CM Short Term Goal #1   Over the next 30 days, patient will work with LCSW to address community resource and social services transitions needs  THN CM Short Term Goal #1 Start Date  01/08/18  THN CM Short Term Goal #1 Met Date   02/06/18  Interventions for Short Term Goal #2   collaboration with LCSW to discuss ongoing SW/Community needs and status  THN CM Short Term Goal #2   Over the next 30 days, patient will  attend all newly scheduled provider appointments  THN CM Short Term Goal #2 Start Date  01/08/18  Interventions for Short Term Goal #2  reviewed upcoming appointment schedule      Alisa Gilboy MHA,BSN,RN,CCM [facility] Care Management  [phone]           

## 2018-02-08 NOTE — Progress Notes (Deleted)
Cardiology Office Note   Date:  02/08/2018   ID:  Kari [name], DOB March 31, 1961, MRN [id number]  PCP:  Rosita Fire, MD  Cardiologist:  Dr.Ross No chief complaint on file.    History of Present Illness: Kari [name] is a 57 y.o. female who presents for ongoing assessment and management of chronic combined systolic and diastolic heart failure, most recent echo revealed EF of 40% to 45% in July 2018, other history includes moderate MR, most recent cardiac catheterization in July 2018 revealing normal coronary anatomy, hypertension, hyperlipidemia, also a history of VT arrest (status post [place] ICD placement in July 2018).  The patient was last seen by Bernerd Pho, PA, on 12/26/2017, status post hospitalization to [facility] ED with systolic hypotension.  Patient was given fluids, the patient continued to use lymphedema compression machine 2 hours/day.  She was closely following her blood pressure at home.  On last office visit, the patient continued to have chronic lower extremity edema but there was improvement seen.  She was continued on torsemide 20 mg daily, carvedilol 12.5 mg twice daily, and lisinopril 2.5 mg daily in the setting of cardiomyopathy.  She was to follow with pacemaker clinic for ongoing interrogation.  The patient was seen by our out reach clinic, Eugenio Hoes RN, on 02/07/2018.  Apparently the patient's best friend died unexpectedly and she is morning that loss.  She is now moved to [place] to be closer to her sister.  Transportation arrangements to office appointments were arranged.  She was continued to be seen by home health nurse for lower extremity lymphedema and impaired skin integrity wounds with wet-to-dry dressings.  She did not follow with the wound clinic as directed.  She was given continued instructions on daily weights, low-sodium diet, and maintaining ambulation.    Past Medical History:  Diagnosis Date  . Asthma   . CHF (congestive heart  failure) ([place]) 03/12/2014   a. EF 40-45% by echo in 06/2017 with normal cors by cath. ICD placed following VT arrest  . Depression   . Headache(784.0)   . Hyperlipidemia   . Hypertension   . Lung nodules   . Renal disorder   . Sarcoidosis   . Ulcer    left posterior calf    Past Surgical History:  Procedure Laterality Date  . cataract surgery Left 07-2013  . ICD IMPLANT N/A 07/05/2017   Procedure: ICD Implant;  Surgeon: Constance Haw, MD;  Location: [place] CV LAB;  Service: Cardiovascular;  Laterality: N/A;  . LEFT HEART CATH AND CORONARY ANGIOGRAPHY N/A 07/03/2017   Procedure: Left Heart Cath and Coronary Angiography;  Surgeon: Belva Crome, MD;  Location: [place] CV LAB;  Service: Cardiovascular;  Laterality: N/A;  . NO PAST SURGERIES       Current Outpatient Medications  Medication Sig Dispense Refill  . acetaminophen (TYLENOL 8 HOUR) 650 MG CR tablet Take 1 tablet (650 mg total) by mouth 2 (two) times daily. 60 tablet 11  . albuterol (PROVENTIL HFA;VENTOLIN HFA) 108 (90 Base) MCG/ACT inhaler Inhale 2 puffs into the lungs every 6 (six) hours as needed for shortness of breath. 18 g 0  . aspirin EC 81 MG tablet Take 81 mg by mouth daily.    . carvedilol (COREG) 12.5 MG tablet Take 1 tablet (12.5 mg total) by mouth 2 (two) times daily with a meal. 180 tablet 3  . collagenase (SANTYL) ointment Apply 1 application topically as needed (for wound care). Apply to  wound BLE per Tx 15 g 0  . lisinopril (PRINIVIL,ZESTRIL) 2.5 MG tablet Take 1 tablet (2.5 mg total) by mouth daily. 90 tablet 3  . loratadine (CLARITIN) 10 MG tablet Take 10 mg by mouth daily.    . nitrofurantoin, macrocrystal-monohydrate, (MACROBID) 100 MG capsule Take 100 mg by mouth 2 (two) times daily. Take 1 capsule by mouth twice daily for 5 days (started 08/08/17)    . ondansetron (ZOFRAN) 4 MG tablet Take 1 tablet (4 mg total) by mouth every 6 (six) hours as needed for nausea. 20 tablet 0  . potassium  chloride SA (KLOR-CON M20) 20 MEQ tablet Take 2 tablets (40 mEq total) by mouth daily. 180 tablet 3  . torsemide (DEMADEX) 20 MG tablet Take 1 tablet (20 mg total) by mouth daily. 90 tablet 3   No current facility-administered medications for this visit.     Allergies:   Patient has no known allergies.    Social History:  The patient  reports that  has never smoked. she has never used smokeless tobacco. She reports that she does not drink alcohol or use drugs.   Family History:  The patient's family history includes Cancer in her mother; Diabetes Mellitus II in her father and mother; Heart disease in her father; Heart failure in her mother; Hypertension in her mother; Stroke in her father.    ROS: All other systems are reviewed and negative. Unless otherwise mentioned in H&P    PHYSICAL EXAM: VS:  LMP 11/21/2011  , BMI There is no height or weight on file to calculate BMI. GEN: Well nourished, well developed, in no acute distress  HEENT: normal  Neck: no JVD, carotid bruits, or masses Cardiac: ***RRR; no murmurs, rubs, or gallops,no edema  Respiratory:  clear to auscultation bilaterally, normal work of breathing GI: soft, nontender, nondistended, + BS MS: no deformity or atrophy  Skin: warm and dry, no rash Neuro:  Strength and sensation are intact Psych: euthymic mood, full affect   EKG:  EKG {ACTION; IS/IS WUX:[id number]} ordered today. The ekg ordered today demonstrates ***   Recent Labs: 06/26/2017: TSH 9.201 07/01/2017: Magnesium 2.4 07/19/2017: ALT 14 10/30/2017: Hemoglobin 10.5; Platelets 203 11/18/2017: B Natriuretic Peptide 81.0 12/05/2017: BUN 51; Creatinine, Ser 1.74; Potassium 4.5; Sodium 133    Lipid Panel    Component Value Date/Time   CHOL 135 06/21/2012 0950   TRIG 59 06/21/2012 0950   HDL 54 06/21/2012 0950   CHOLHDL 2.5 06/21/2012 0950   VLDL 12 06/21/2012 0950   LDLCALC 69 06/21/2012 0950      Wt Readings from Last 3 Encounters:  12/26/17 (!) 306 lb  (138.8 kg)  12/18/17 300 lb (136.1 kg)  12/11/17 (!) 305 lb (138.3 kg)      Other studies Reviewed: Additional studies/ records that were reviewed today include: ***. Review of the above records demonstrates: ***   ASSESSMENT AND PLAN:  1.  ***   Current medicines are reviewed at length with the patient today.    Labs/ tests ordered today include: *** Phill Myron. [place] Pugh, ANP, AACC   02/08/2018 2:40 PM    [facility] [address], [place], [place] [address] Phone: [phone]; Fax: [phone]

## 2018-02-10 ENCOUNTER — Ambulatory Visit: Payer: Medicare Other | Admitting: Adult Health

## 2018-02-13 ENCOUNTER — Other Ambulatory Visit: Payer: Self-pay | Admitting: *Deleted

## 2018-02-13 DIAGNOSIS — L97822 Non-pressure chronic ulcer of other part of left lower leg with fat layer exposed: Secondary | ICD-10-CM | POA: Diagnosis not present

## 2018-02-13 DIAGNOSIS — N183 Chronic kidney disease, stage 3 (moderate): Secondary | ICD-10-CM | POA: Diagnosis not present

## 2018-02-13 DIAGNOSIS — I5041 Acute combined systolic (congestive) and diastolic (congestive) heart failure: Secondary | ICD-10-CM | POA: Diagnosis not present

## 2018-02-13 DIAGNOSIS — I872 Venous insufficiency (chronic) (peripheral): Secondary | ICD-10-CM | POA: Diagnosis not present

## 2018-02-13 DIAGNOSIS — I130 Hypertensive heart and chronic kidney disease with heart failure and stage 1 through stage 4 chronic kidney disease, or unspecified chronic kidney disease: Secondary | ICD-10-CM | POA: Diagnosis not present

## 2018-02-13 DIAGNOSIS — L97222 Non-pressure chronic ulcer of left calf with fat layer exposed: Secondary | ICD-10-CM | POA: Diagnosis not present

## 2018-02-13 NOTE — Patient Outreach (Addendum)
[facility]) Care Management  02/13/2018  EMILA STEINHAUSER 01-08-1961 [id number]   Hazleigh Mccleave Houghtonis an 57 y.o.femalewith a hx of hypertension, sarcoidosis, hyperlipidemia, mixed CHF with most recent echocardiogram revealing EF of 40%-45%, nonischemic cardiomyopathy, history of V. fib arrest requiring defibrillation, status post ICD implantation on 07/05/2017.   [facility] Care Management has been following Mrs. Norling in the community at first for Transition of Cares services after her hospital admission in July 2018 then for ongoing complex care management and care coordination needs. She is being followed by nursing and social work services. Over the last month, we have transitioned to telephonic nursing case management services as Mrs. Rog is progressing in disease management and self health management proficiency.   I spoke with Mrs. Hume by phone today and addressed the following needs:   Psychosocial Needs:   Grief- Ms. Souder's best friend died unexpectedly recently. She is being followed by our LCSW who is providing support and referred Ms. Hibbitts to [place] for ongoing grief counseling.   Housing and Transition- Ms. Hulet moved to her new residence in [place] from [place] and says she is glad to be close to her sister and feels her new living situation is good.    Knowledge Deficits related to food resources - Ms. Brossman called today prior to my scheduled outreach to her to request information about food resources in the [place] area. She only recently moved to [facility] from [facility] and has knowledge deficits about food resources. Today, she say she is running low on food and would like to know where she can go to access local food banks or other food resources. I reached out to Raynaldo Opitz LCSW to request her input and assistance. I returned a call to Ms. Ludwig to provide  information available to me.   Transportation- Ms. Schoenberger has been connected by our LCSW to [place] in [facility] for transportation to doctor appointments.   Medical Needs/Issues:   Lower Extremity Edema/Lymphedema with Impaired Skin Integrity- Mrs. Moosman has been receivingtwiceweekly dressing changes (Santyl >W>D) by her home health nurse and has been performing the daily dressing changes independently on days her nurse does not come.She feels her legs are improving and says her home health nurse agrees.   Ms. Hegwood has been scheduled on 3 separate occasions to be seen [facility](last on 12/23/17 @ 1:30pm).However, she cancelled the appointment because she was out of town helping her sisterwho lost her job.Upon her return from helping her sister, she experienced the unexpected death of her best friend. At this time, Ms. Wexler says she doesn't feel she needs to go to the wound care center. I advised that she discuss this with her new provider at her appointment on 02/14/18.   CHF/Volume Management- Ms. Mairena'slasix was discontinued and she was started on Demadex which she has been taking since 11/19/17.She has been taking Demadex as prescribed and described a dramatic uptick in urinary frequency.She continues to weigh herself daily and record and can verbalize good understanding of signs and symptoms of volume overload and when to call for weight gain or worsened symptoms.She tells me today her weight is down to 295#.   Cardiology Appointment - Ms. Lesser's will be staying with her cardiology provider group but changing to the [place] office. She had an appointment on 02/10/18 with [name] but cancelled it because of what she felt was a viral illness. Her appointment has  been rescheduled for 02/25/18 @ 3:30pm.   Provider Appointments - Ms. Vowles has 2 provider appointments tomorrow including a new patient/intake appointment  with her new primary care practice.She is aware of the date/time/locations for both appointments and has planned for transportation. She understands from her home health nurse that her new PCP has a request for new orders related to her wound care and anticipates discussion about this tomorrow. In addition, Ms. Keithley is to see the podiatrist and has questions about foot care as her lower leg wounds are near the top of her feet and she has concerns about foot care. I have planned an in home/face to face visit with Ms. Rick next week to follow up with her after her appointments.   Plan: I will follow up with Ms. Winfield next week at her home. I will continue to collaborate with Raynaldo Opitz LCSW re: ongoing community resource and food needs.   [facility] CM Care Plan Problem One     Most Recent Value  Care Plan Problem One  Knowledge Deficits related to long term plan of care for Management of Lower Extremities/Wounds  Role Documenting the Problem One  Care Management [place] for Problem One  Active  THN Long Term Goal   Over the next 31 days, patient will verbalize understanding of revised plan of care for managment of lower extremity wounds  THN Long Term Goal Start Date  02/13/18  Interventions for Problem One Long Term Goal  reviewed plans for potential new plan after PCP visit tomorrow and ongoing [place] visit schedule  [facility]N CM Short Term Goal #1   Over the next 72 hours, patient will discuss revised plans for wound care with PCP and Podiatrist   THN CM Short Term Goal #1 Start Date  02/13/18  Interventions for Short Term Goal #1  reviewed plans for PCP and podiatry appointment    [facility] CM Care Plan Problem Two     Most Recent Value  Care Plan Problem Two  Transition and Care Coordination Needs  Role Documenting the Problem Two  Care Management [place] for Problem Two  Active  Interventions for Problem Two Long Term Goal   reviewed patient current living status and  plans  THN Long Term Goal  Over the next 60 days, patient will verbalize finalization of transfer of clinical and social services needed to change to permanent residence in [place] Term Goal Start Date  01/08/18  [facility] CM Short Term Goal #1   Over the next 7 days, patient will verbalize knowledge of available food resources in her new county of residence  [place] Term Goal #1 Start Date  02/13/18  Interventions for Short Term Goal #2   collaboration with LCSW,  provided information to patient about food resources  THN CM Short Term Goal #2   Over the next 30 days, patient will attend all newly scheduled provider appointments  [facility] CM Short Term Goal #2 Start Date  01/08/18  Interventions for Short Term Goal #2  discussed plans for tomorrow's appointments including questions for provider and transportation needs      [place] Management  [phone]

## 2018-02-14 ENCOUNTER — Ambulatory Visit: Payer: Medicare Other | Admitting: Family Medicine

## 2018-02-17 ENCOUNTER — Other Ambulatory Visit: Payer: Self-pay | Admitting: *Deleted

## 2018-02-17 ENCOUNTER — Ambulatory Visit: Payer: Medicare Other | Admitting: Family Medicine

## 2018-02-18 ENCOUNTER — Ambulatory Visit: Payer: Medicare Other | Admitting: Family Medicine

## 2018-02-18 NOTE — Patient Outreach (Signed)
[facility]) Care Management  02/18/2018  DONNICE NIELSEN 1961-06-16 [id number]   CSW attempted to reach patient to follow-up on resources but phone kept going to message stating that voicemail box has not been setup yet. CSW will make second attempt to reach patient tomorrow, 2/27.    Raynaldo Opitz, LCSW [facility]  Clinical Social Worker cell #: [phone]

## 2018-02-19 ENCOUNTER — Other Ambulatory Visit: Payer: Self-pay | Admitting: *Deleted

## 2018-02-20 ENCOUNTER — Ambulatory Visit: Payer: Medicare Other | Admitting: *Deleted

## 2018-02-20 ENCOUNTER — Emergency Department (HOSPITAL_COMMUNITY): Payer: Medicare Other

## 2018-02-20 ENCOUNTER — Encounter (HOSPITAL_COMMUNITY): Payer: Self-pay

## 2018-02-20 ENCOUNTER — Inpatient Hospital Stay (HOSPITAL_COMMUNITY)
Admission: EM | Admit: 2018-02-20 | Discharge: 2018-02-26 | DRG: 871 | Disposition: A | Discharge status: Home Health | Payer: Medicare Other | Attending: Internal Medicine | Admitting: Internal Medicine

## 2018-02-20 DIAGNOSIS — L97909 Non-pressure chronic ulcer of unspecified part of unspecified lower leg with unspecified severity: Secondary | ICD-10-CM

## 2018-02-20 DIAGNOSIS — N179 Acute kidney failure, unspecified: Secondary | ICD-10-CM | POA: Diagnosis not present

## 2018-02-20 DIAGNOSIS — Z95810 Presence of automatic (implantable) cardiac defibrillator: Secondary | ICD-10-CM

## 2018-02-20 DIAGNOSIS — Z7982 Long term (current) use of aspirin: Secondary | ICD-10-CM

## 2018-02-20 DIAGNOSIS — I255 Ischemic cardiomyopathy: Secondary | ICD-10-CM | POA: Diagnosis present

## 2018-02-20 DIAGNOSIS — I130 Hypertensive heart and chronic kidney disease with heart failure and stage 1 through stage 4 chronic kidney disease, or unspecified chronic kidney disease: Secondary | ICD-10-CM | POA: Diagnosis present

## 2018-02-20 DIAGNOSIS — E878 Other disorders of electrolyte and fluid balance, not elsewhere classified: Secondary | ICD-10-CM | POA: Diagnosis present

## 2018-02-20 DIAGNOSIS — L97919 Non-pressure chronic ulcer of unspecified part of right lower leg with unspecified severity: Secondary | ICD-10-CM | POA: Diagnosis present

## 2018-02-20 DIAGNOSIS — Z6841 Body Mass Index (BMI) 40.0 and over, adult: Secondary | ICD-10-CM | POA: Diagnosis not present

## 2018-02-20 DIAGNOSIS — Z8674 Personal history of sudden cardiac arrest: Secondary | ICD-10-CM

## 2018-02-20 DIAGNOSIS — Z79899 Other long term (current) drug therapy: Secondary | ICD-10-CM

## 2018-02-20 DIAGNOSIS — A419 Sepsis, unspecified organism: Secondary | ICD-10-CM | POA: Diagnosis not present

## 2018-02-20 DIAGNOSIS — I959 Hypotension, unspecified: Secondary | ICD-10-CM | POA: Diagnosis present

## 2018-02-20 DIAGNOSIS — Z452 Encounter for adjustment and management of vascular access device: Secondary | ICD-10-CM

## 2018-02-20 DIAGNOSIS — R404 Transient alteration of awareness: Secondary | ICD-10-CM | POA: Diagnosis not present

## 2018-02-20 DIAGNOSIS — J45909 Unspecified asthma, uncomplicated: Secondary | ICD-10-CM | POA: Diagnosis present

## 2018-02-20 DIAGNOSIS — E870 Hyperosmolality and hypernatremia: Secondary | ICD-10-CM | POA: Diagnosis not present

## 2018-02-20 DIAGNOSIS — D631 Anemia in chronic kidney disease: Secondary | ICD-10-CM | POA: Diagnosis present

## 2018-02-20 DIAGNOSIS — R579 Shock, unspecified: Secondary | ICD-10-CM | POA: Diagnosis present

## 2018-02-20 DIAGNOSIS — R6521 Severe sepsis with septic shock: Secondary | ICD-10-CM | POA: Diagnosis not present

## 2018-02-20 DIAGNOSIS — L97929 Non-pressure chronic ulcer of unspecified part of left lower leg with unspecified severity: Secondary | ICD-10-CM | POA: Diagnosis present

## 2018-02-20 DIAGNOSIS — D869 Sarcoidosis, unspecified: Secondary | ICD-10-CM | POA: Diagnosis present

## 2018-02-20 DIAGNOSIS — E860 Dehydration: Secondary | ICD-10-CM | POA: Diagnosis present

## 2018-02-20 DIAGNOSIS — E872 Acidosis: Secondary | ICD-10-CM | POA: Diagnosis present

## 2018-02-20 DIAGNOSIS — R531 Weakness: Secondary | ICD-10-CM | POA: Diagnosis not present

## 2018-02-20 DIAGNOSIS — N136 Pyonephrosis: Secondary | ICD-10-CM | POA: Diagnosis present

## 2018-02-20 DIAGNOSIS — I83009 Varicose veins of unspecified lower extremity with ulcer of unspecified site: Secondary | ICD-10-CM | POA: Diagnosis present

## 2018-02-20 DIAGNOSIS — R0602 Shortness of breath: Secondary | ICD-10-CM | POA: Diagnosis not present

## 2018-02-20 DIAGNOSIS — E785 Hyperlipidemia, unspecified: Secondary | ICD-10-CM | POA: Diagnosis present

## 2018-02-20 DIAGNOSIS — E875 Hyperkalemia: Secondary | ICD-10-CM | POA: Diagnosis present

## 2018-02-20 DIAGNOSIS — W1830XA Fall on same level, unspecified, initial encounter: Secondary | ICD-10-CM | POA: Diagnosis present

## 2018-02-20 DIAGNOSIS — N183 Chronic kidney disease, stage 3 (moderate): Secondary | ICD-10-CM | POA: Diagnosis present

## 2018-02-20 DIAGNOSIS — I5032 Chronic diastolic (congestive) heart failure: Secondary | ICD-10-CM | POA: Diagnosis present

## 2018-02-20 DIAGNOSIS — Z86718 Personal history of other venous thrombosis and embolism: Secondary | ICD-10-CM

## 2018-02-20 DIAGNOSIS — D638 Anemia in other chronic diseases classified elsewhere: Secondary | ICD-10-CM | POA: Diagnosis present

## 2018-02-20 DIAGNOSIS — I509 Heart failure, unspecified: Secondary | ICD-10-CM

## 2018-02-20 DIAGNOSIS — R05 Cough: Secondary | ICD-10-CM | POA: Diagnosis not present

## 2018-02-20 DIAGNOSIS — E876 Hypokalemia: Secondary | ICD-10-CM | POA: Diagnosis present

## 2018-02-20 DIAGNOSIS — N390 Urinary tract infection, site not specified: Secondary | ICD-10-CM | POA: Diagnosis present

## 2018-02-20 LAB — CBC WITH DIFFERENTIAL/PLATELET
Basophils Absolute: 0 10*3/uL (ref 0.0–0.1)
Basophils Relative: 0 %
Eosinophils Absolute: 0 10*3/uL (ref 0.0–0.7)
Eosinophils Relative: 0 %
HCT: 26.7 % — ABNORMAL LOW (ref 36.0–46.0)
Hemoglobin: 8.6 g/dL — ABNORMAL LOW (ref 12.0–15.0)
Lymphocytes Relative: 6 %
Lymphs Abs: 0.9 10*3/uL (ref 0.7–4.0)
MCH: 28.3 pg (ref 26.0–34.0)
MCHC: 32.2 g/dL (ref 30.0–36.0)
MCV: 87.8 fL (ref 78.0–100.0)
Monocytes Absolute: 1.3 10*3/uL — ABNORMAL HIGH (ref 0.1–1.0)
Monocytes Relative: 9 %
Neutro Abs: 13 10*3/uL — ABNORMAL HIGH (ref 1.7–7.7)
Neutrophils Relative %: 85 %
Platelets: 283 10*3/uL (ref 150–400)
RBC: 3.04 MIL/uL — ABNORMAL LOW (ref 3.87–5.11)
RDW: 17.1 % — ABNORMAL HIGH (ref 11.5–15.5)
WBC: 15.3 10*3/uL — ABNORMAL HIGH (ref 4.0–10.5)

## 2018-02-20 LAB — I-STAT CG4 LACTIC ACID, ED: Lactic Acid, Venous: 0.71 mmol/L (ref 0.5–1.9)

## 2018-02-20 MED ORDER — SODIUM CHLORIDE 0.9 % IV BOLUS (SEPSIS)
250.0000 mL | Freq: Once | INTRAVENOUS | Status: AC
  Administered 2018-02-21: 250 mL via INTRAVENOUS

## 2018-02-20 NOTE — Patient Outreach (Signed)
[facility]) Care Management  02/20/2018  Kari [name] [birth year] [id number]   CSW called & spoke with patient to follow-up on community resources. Patient expressed appreciation for $25 giftcard that [place] provided to patient for food. Patient states that she has tried to call around to the food banks provided over the phone but that she was waiting for calls back. CSW will have CMA mail food bank list and resources. Patient also reports that she is now setup with [employer] for transportation. Will follow-up in 2 weeks.    Raynaldo Opitz, LCSW [facility]  Clinical Social Worker cell #: [phone]

## 2018-02-20 NOTE — ED Provider Notes (Signed)
[facility] DEPT Provider Note   CSN: [id number] Arrival date & time: 02/20/18  2105     History   Chief Complaint Chief Complaint  Patient presents with  . Skin Ulcer    Bilateral leg    HPI Kari [name] is a 57 y.o. female.  HPI  Patient with history of CHF, hypertension, chronic bilateral lower extremity ulcerations presenting with complaint of generalized weakness at home today.  Her sister came to visit her today and found her in the floor after an apparent fall.  Patient denies any specific areas of pain.  Sister felt that patient appeared more tired than her usual and was having difficulty getting up after the fall.  She has chronic leg wounds but patient states that the wounds are not worse than they are usually.  She denies any increase in pain any change in drainage or smell.  She has recently had URI symptoms and some cough.  She denies any fever.  She has had no vomiting or change in her stools.  No shortness of breath worse than her baseline.  Her lower extremities are chronically swollen but not worse than usual per patient.  She endorses taking all of her medications as prescribed.  Past Medical History:  Diagnosis Date  . Asthma   . CHF (congestive heart failure) ([place]) 03/12/2014   a. EF 40-45% by echo in 06/2017 with normal cors by cath. ICD placed following VT arrest  . Depression   . Headache(784.0)   . Hyperlipidemia   . Hypertension   . Lung nodules   . Renal disorder   . Sarcoidosis   . Ulcer    left posterior calf    Patient Active Problem List   Diagnosis Date Noted  . Mitral regurgitation 10/30/2017  . Acute kidney injury ([place]) 07/21/2017  . Generalized weakness 07/20/2017  . Shock circulatory ([place])   . Acute respiratory failure with hypoxia ([place])   . Pulmonary edema   . Cardiac arrest ([place]) 06/26/2017  . Ventricular fibrillation ([place])   . Acute on chronic diastolic heart failure ([place])   . Abnormal ECG   .  Cellulitis 04/02/2017  . AKI (acute kidney injury) ([place]) 12/17/2016  . Cellulitis and abscess of right lower extremity 12/17/2016  . Syncope and collapse 12/17/2016  . Peripheral edema 12/17/2016  . Hypokalemia 12/17/2016  . Anemia 12/17/2016  . Acute diastolic CHF (congestive heart failure) ([place]) 03/14/2014  . CHF (congestive heart failure) ([place]) 03/12/2014  . Sarcoidosis ([place]) 03/12/2014  . Severe sepsis ([place]) 10/17/2013  . ARF (acute renal failure) ([place]) 10/08/2013  . Cellulitis and abscess 10/08/2013  . Morbid obesity ([place]) 10/08/2013  . Volume depletion 10/08/2013  . Unspecified venous (peripheral) insufficiency 10/28/2012  . Mediastinal lymphadenopathy 10/16/2012  . Pulmonary nodules 10/16/2012  . 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 Chest pain 09/02/2012  . Asthma   . Hypertension   . Depression   . Venous stasis ulcers ([place]) 07/29/2012  . Varicose veins of lower extremities with ulcer ([place]) 07/08/2012  . Venous ulcer of leg ([place]) 07/08/2012    Past Surgical History:  Procedure Laterality Date  . cataract surgery Left 07-2013  . ICD IMPLANT N/A 07/05/2017   Procedure: ICD Implant;  Surgeon: Constance Haw, MD;  Location: [facility];  Service: Cardiovascular;  Laterality: N/A;  . LEFT HEART CATH AND CORONARY ANGIOGRAPHY N/A 07/03/2017   Procedure: Left Heart Cath and Coronary Angiography;  Surgeon: Belva Crome, MD;  Location: [facility];  Service: Cardiovascular;  Laterality: N/A;  . NO PAST SURGERIES      OB History    No data available       Home Medications    Prior to Admission medications   Medication Sig Start Date End Date Taking? Authorizing Provider  acetaminophen (TYLENOL 8 HOUR) 650 MG CR tablet Take 1 tablet (650 mg total) by mouth 2 (two) times daily. 12/05/17  Yes Strader, [place] M, PA-C  albuterol (PROVENTIL HFA;VENTOLIN HFA) 108 (90 Base) MCG/ACT inhaler Inhale 2  puffs into the lungs every 6 (six) hours as needed for shortness of breath. 07/06/17  Yes Mikhail, Velta Addison, DO  aspirin EC 81 MG tablet Take 81 mg by mouth daily.   Yes [provider]  carvedilol (COREG) 12.5 MG tablet Take 1 tablet (12.5 mg total) by mouth 2 (two) times daily with a meal. 12/26/17  Yes Strader, [place], PA-C  collagenase (SANTYL) ointment Apply 1 application topically as needed (for wound care). Apply to wound BLE per Tx 07/06/17  Yes Mikhail, Velta Addison, DO  lisinopril (PRINIVIL,ZESTRIL) 2.5 MG tablet Take 1 tablet (2.5 mg total) by mouth daily. 12/26/17 03/26/18 Yes Strader, Fransisco Hertz, PA-C  loratadine (CLARITIN) 10 MG tablet Take 10 mg by mouth daily.   Yes [provider]  ondansetron (ZOFRAN) 4 MG tablet Take 1 tablet (4 mg total) by mouth every 6 (six) hours as needed for nausea. 04/06/17  Yes Sinda Du, MD  potassium chloride SA (KLOR-CON M20) 20 MEQ tablet Take 2 tablets (40 mEq total) by mouth daily. 12/26/17 03/26/18 Yes Strader, Fransisco Hertz, PA-C  torsemide (DEMADEX) 20 MG tablet Take 1 tablet (20 mg total) by mouth daily. 12/26/17 03/26/18 Yes Strader, Fransisco Hertz, PA-C    Family History Family History  Problem Relation Age of Onset  . Heart failure Mother   . Diabetes Mellitus II Mother   . Hypertension Mother   . Cancer Mother        unknown type  . Heart disease Father   . Stroke Father   . Diabetes Mellitus II Father     Social History Social History   Tobacco Use  . Smoking status: Never Smoker  . Smokeless tobacco: Never Used  Substance Use Topics  . Alcohol use: No  . Drug use: No     Allergies   Patient has no known allergies.   Review of Systems Review of Systems  ROS reviewed and all otherwise negative except for mentioned in HPI   Physical Exam Updated Vital Signs BP (!) 107/46   Pulse 75   Temp 97.7 F (36.5 C) (Oral)   Resp (!) 24   Ht 5\' 7"  (1.702 m)   LMP 11/21/2011   SpO2 99%   BMI 47.93 kg/m  Vitals  reviewed Physical Exam  Physical Examination: General appearance - alert, well appearing, and in no distress Mental status - alert, oriented to person, place, and time Eyes - no conjunctival injection, no scleral icterus Mouth - mucous membranes moist, pharynx normal without lesions Chest - clear to auscultation, no wheezes, rales or rhonchi, symmetric air entry Heart - normal rate, regular rhythm, normal S1, S2, no murmurs, rubs, clicks or gallops Abdomen - soft, nontender, nondistended, no masses or organomegaly Neurological - alert, oriented, normal speech Extremities - peripheral pulses normal, chronic large bilateral lower extremity chronic ulcerations with associated venous stasis changes, symmetric lower extremity edema Skin - normal coloration  and turgor, other than large chronic ulcerative wounds on bilateral lower extremities   ED Treatments / Results  Labs (all labs ordered are listed, but only abnormal results are displayed) Labs Reviewed  CBC WITH DIFFERENTIAL/PLATELET - Abnormal; Notable for the following components:      Result Value   WBC 15.3 (*)    RBC 3.04 (*)    Hemoglobin 8.6 (*)    HCT 26.7 (*)    RDW 17.1 (*)    Neutro Abs 13.0 (*)    Monocytes Absolute 1.3 (*)    All other components within normal limits  BASIC METABOLIC PANEL - Abnormal; Notable for the following components:   Potassium >7.5 (*)    Chloride 113 (*)    CO2 12 (*)    BUN 88 (*)    Creatinine, Ser 3.85 (*)    GFR calc non Af Amer 12 (*)    GFR calc Af Amer 14 (*)    All other components within normal limits  URINALYSIS, ROUTINE W REFLEX MICROSCOPIC - Abnormal; Notable for the following components:   APPearance HAZY (*)    Hgb urine dipstick SMALL (*)    Leukocytes, UA LARGE (*)    Bacteria, UA RARE (*)    Squamous Epithelial / LPF 0-5 (*)    All other components within normal limits  URINE CULTURE  I-STAT CG4 LACTIC ACID, ED  I-STAT CG4 LACTIC ACID, ED    EKG  EKG  Interpretation None       Radiology Dg Chest 2 View  Result Date: 02/20/2018 CLINICAL DATA:  Shortness of Breath EXAM: CHEST  2 VIEW COMPARISON:  07/19/2017 FINDINGS: Left AICD. Cardiomegaly with vascular congestion. Bibasilar atelectasis. No effusions. No acute bony abnormality. IMPRESSION: Cardiomegaly with vascular congestion.  Bibasilar atelectasis. Electronically Signed   By: Rolm Baptise M.D.   On: 02/20/2018 22:56   Dg Tibia/fibula Left  Result Date: 02/20/2018 CLINICAL DATA:  Ulcers to both legs EXAM: LEFT TIBIA AND FIBULA - 2 VIEW COMPARISON:  08/22/2015 FINDINGS: No acute bony abnormality. No fracture, subluxation or dislocation. There are areas of wavy periosteal thickening within the left fibula, likely related to chronic venous stasis. No radiographic changes of osteomyelitis. Soft tissues are intact. IMPRESSION: No acute abnormality. Electronically Signed   By: Rolm Baptise M.D.   On: 02/20/2018 23:01   Dg Tibia/fibula Right  Result Date: 02/20/2018 CLINICAL DATA:  Bilateral lower leg ulcers. EXAM: RIGHT TIBIA AND FIBULA - 2 VIEW COMPARISON:  None. FINDINGS: Wavy periosteal thickening in the tibia and fibula, likely related to chronic venous stasis disease. No acute bony abnormality. Specifically, no fracture, subluxation, or dislocation. No radiographic changes of osteomyelitis. IMPRESSION: No acute bony abnormality. Electronically Signed   By: Rolm Baptise M.D.   On: 02/20/2018 23:02    Procedures Procedures (including critical care time)  Medications Ordered in ED Medications  sodium chloride 0.9 % bolus 250 mL (not administered)  calcium gluconate 1 g in sodium chloride 0.9 % 100 mL IVPB (not administered)  insulin aspart (novoLOG) injection 10 Units (not administered)  dextrose 50 % solution 50 mL (not administered)  sodium polystyrene (KAYEXALATE) 15 GM/60ML suspension 30 g (not administered)    CRITICAL CARE Performed by: Marcha Dutton, Jabier Deese L Total critical care time:  35 minutes Critical care time was exclusive of separately billable procedures and treating other patients. Critical care was necessary to treat or prevent imminent or life-threatening deterioration. Critical care was time spent personally by me on the following  activities: development of treatment plan with patient and/or surrogate as well as nursing, discussions with consultants, evaluation of patient's response to treatment, examination of patient, obtaining history from patient or surrogate, ordering and performing treatments and interventions, ordering and review of laboratory studies, ordering and review of radiographic studies, pulse oximetry and re-evaluation of patient's condition.  Initial Impression / Assessment and Plan / ED Course  I have reviewed the triage vital signs and the nursing notes.  Pertinent labs & imaging results that were available during my care of the patient were reviewed by me and considered in my medical decision making (see chart for details).   pt found to be in acute renal failure with elevated potassium > 7.5- will obtain EKG, calcium gluconate, insulin/glucose and kayexalate ordered.  Pt will need admission for this renal failure.  ua is pending.    12:48 AM  D/w Dr. Tamala Julian, hospitalist.  Will place consult to renal.    1:05 AM  D/w renal, Dr. Lorrene Reid, she will pass the patient's info along to the morning renal team.  Call sooner if patient has any problems.      Final Clinical Impressions(s) / ED Diagnoses   Final diagnoses:  Hyperkalemia  AKI (acute kidney injury) [facility])  Generalized weakness    ED Discharge Orders    None       Kaelan Amble, Forbes Cellar, MD 02/21/18 [phone]

## 2018-02-20 NOTE — Patient Outreach (Signed)
[facility]) Care Management  02/20/2018  MARTHE DANT 16-Jun-[phone]   Ms. Port was scheduled for a routine home visit yesterday. She asked that I call her prior to my visit. When I called Ms. Schetter did not answer. I let a message requesting a return call. Ms. Wooley returned a call to me after hours telling me that she'd had a difficult morning and had fallen. She asked if I could come another day and also wanted to know if I could help her get a walker. I tried returning a call to her on 02/20/09 but didn't get an answer and couldn't leave a voice message.   Plan: I will reach out to Ms. Wojnarowski again tomorrow.    [facility]  [phone]

## 2018-02-20 NOTE — Patient Outreach (Signed)
Request received from Kelly Harrison, LCSW to mail patient personal care resources.  Information mailed today. 

## 2018-02-20 NOTE — ED Notes (Signed)
Bed: XI71 Expected date:  Expected time:  Means of arrival:  Comments: 57 yo F  Leg problem

## 2018-02-20 NOTE — ED Triage Notes (Signed)
Pt arrived from home with complaints of bilateral ulcers on her legs that began to smell today after she missed her drs appt. Pt states she has had flu like symptoms over the last two weeks. No complaints of pain at this time only generalized weakness.

## 2018-02-21 ENCOUNTER — Other Ambulatory Visit: Payer: Self-pay | Admitting: *Deleted

## 2018-02-21 ENCOUNTER — Inpatient Hospital Stay (HOSPITAL_COMMUNITY): Payer: Medicare Other

## 2018-02-21 DIAGNOSIS — N390 Urinary tract infection, site not specified: Secondary | ICD-10-CM | POA: Diagnosis present

## 2018-02-21 DIAGNOSIS — L97919 Non-pressure chronic ulcer of unspecified part of right lower leg with unspecified severity: Secondary | ICD-10-CM | POA: Diagnosis present

## 2018-02-21 DIAGNOSIS — E875 Hyperkalemia: Secondary | ICD-10-CM

## 2018-02-21 DIAGNOSIS — R6521 Severe sepsis with septic shock: Secondary | ICD-10-CM | POA: Diagnosis present

## 2018-02-21 DIAGNOSIS — L97929 Non-pressure chronic ulcer of unspecified part of left lower leg with unspecified severity: Secondary | ICD-10-CM | POA: Diagnosis present

## 2018-02-21 DIAGNOSIS — Z6841 Body Mass Index (BMI) 40.0 and over, adult: Secondary | ICD-10-CM | POA: Diagnosis not present

## 2018-02-21 DIAGNOSIS — N179 Acute kidney failure, unspecified: Secondary | ICD-10-CM

## 2018-02-21 DIAGNOSIS — D649 Anemia, unspecified: Secondary | ICD-10-CM

## 2018-02-21 DIAGNOSIS — I5022 Chronic systolic (congestive) heart failure: Secondary | ICD-10-CM

## 2018-02-21 DIAGNOSIS — L03119 Cellulitis of unspecified part of limb: Secondary | ICD-10-CM | POA: Diagnosis not present

## 2018-02-21 DIAGNOSIS — Z95810 Presence of automatic (implantable) cardiac defibrillator: Secondary | ICD-10-CM | POA: Diagnosis not present

## 2018-02-21 DIAGNOSIS — N136 Pyonephrosis: Secondary | ICD-10-CM | POA: Diagnosis present

## 2018-02-21 DIAGNOSIS — E872 Acidosis: Secondary | ICD-10-CM | POA: Diagnosis not present

## 2018-02-21 DIAGNOSIS — I959 Hypotension, unspecified: Secondary | ICD-10-CM | POA: Diagnosis not present

## 2018-02-21 DIAGNOSIS — I5032 Chronic diastolic (congestive) heart failure: Secondary | ICD-10-CM | POA: Diagnosis present

## 2018-02-21 DIAGNOSIS — E860 Dehydration: Secondary | ICD-10-CM | POA: Diagnosis present

## 2018-02-21 DIAGNOSIS — L97202 Non-pressure chronic ulcer of unspecified calf with fat layer exposed: Secondary | ICD-10-CM | POA: Diagnosis not present

## 2018-02-21 DIAGNOSIS — I83002 Varicose veins of unspecified lower extremity with ulcer of calf: Secondary | ICD-10-CM

## 2018-02-21 DIAGNOSIS — D631 Anemia in chronic kidney disease: Secondary | ICD-10-CM | POA: Diagnosis present

## 2018-02-21 DIAGNOSIS — Z452 Encounter for adjustment and management of vascular access device: Secondary | ICD-10-CM

## 2018-02-21 DIAGNOSIS — W1830XA Fall on same level, unspecified, initial encounter: Secondary | ICD-10-CM | POA: Diagnosis present

## 2018-02-21 DIAGNOSIS — R579 Shock, unspecified: Secondary | ICD-10-CM | POA: Diagnosis not present

## 2018-02-21 DIAGNOSIS — E876 Hypokalemia: Secondary | ICD-10-CM | POA: Diagnosis not present

## 2018-02-21 DIAGNOSIS — D869 Sarcoidosis, unspecified: Secondary | ICD-10-CM | POA: Diagnosis present

## 2018-02-21 DIAGNOSIS — E785 Hyperlipidemia, unspecified: Secondary | ICD-10-CM | POA: Diagnosis present

## 2018-02-21 DIAGNOSIS — J45909 Unspecified asthma, uncomplicated: Secondary | ICD-10-CM | POA: Diagnosis present

## 2018-02-21 DIAGNOSIS — N183 Chronic kidney disease, stage 3 (moderate): Secondary | ICD-10-CM | POA: Diagnosis present

## 2018-02-21 DIAGNOSIS — R531 Weakness: Secondary | ICD-10-CM

## 2018-02-21 DIAGNOSIS — E878 Other disorders of electrolyte and fluid balance, not elsewhere classified: Secondary | ICD-10-CM | POA: Diagnosis present

## 2018-02-21 DIAGNOSIS — A419 Sepsis, unspecified organism: Principal | ICD-10-CM

## 2018-02-21 DIAGNOSIS — I255 Ischemic cardiomyopathy: Secondary | ICD-10-CM | POA: Diagnosis present

## 2018-02-21 DIAGNOSIS — I130 Hypertensive heart and chronic kidney disease with heart failure and stage 1 through stage 4 chronic kidney disease, or unspecified chronic kidney disease: Secondary | ICD-10-CM | POA: Diagnosis present

## 2018-02-21 DIAGNOSIS — E870 Hyperosmolality and hypernatremia: Secondary | ICD-10-CM | POA: Diagnosis not present

## 2018-02-21 DIAGNOSIS — I83009 Varicose veins of unspecified lower extremity with ulcer of unspecified site: Secondary | ICD-10-CM | POA: Diagnosis present

## 2018-02-21 LAB — CBC
HCT: 23.2 % — ABNORMAL LOW (ref 36.0–46.0)
Hemoglobin: 7.5 g/dL — ABNORMAL LOW (ref 12.0–15.0)
MCH: 28.6 pg (ref 26.0–34.0)
MCHC: 32.3 g/dL (ref 30.0–36.0)
MCV: 88.5 fL (ref 78.0–100.0)
Platelets: 207 10*3/uL (ref 150–400)
RBC: 2.62 MIL/uL — ABNORMAL LOW (ref 3.87–5.11)
RDW: 16.3 % — ABNORMAL HIGH (ref 11.5–15.5)
WBC: 9.3 10*3/uL (ref 4.0–10.5)

## 2018-02-21 LAB — BLOOD GAS, ARTERIAL
Acid-base deficit: 12.4 mmol/L — ABNORMAL HIGH (ref 0.0–2.0)
Bicarbonate: 13.2 mmol/L — ABNORMAL LOW (ref 20.0–28.0)
Drawn by: 270211
O2 Saturation: 96.6 %
Patient temperature: 98.6
pCO2 arterial: 29.8 mmHg — ABNORMAL LOW (ref 32.0–48.0)
pH, Arterial: 7.268 — ABNORMAL LOW (ref 7.350–7.450)
pO2, Arterial: 92.6 mmHg (ref 83.0–108.0)

## 2018-02-21 LAB — RENAL FUNCTION PANEL
Albumin: 1.9 g/dL — ABNORMAL LOW (ref 3.5–5.0)
Anion gap: 7 (ref 5–15)
BUN: 86 mg/dL — ABNORMAL HIGH (ref 6–20)
CO2: 14 mmol/L — ABNORMAL LOW (ref 22–32)
Calcium: 8.2 mg/dL — ABNORMAL LOW (ref 8.9–10.3)
Chloride: 118 mmol/L — ABNORMAL HIGH (ref 101–111)
Creatinine, Ser: 3.42 mg/dL — ABNORMAL HIGH (ref 0.44–1.00)
GFR calc Af Amer: 16 mL/min — ABNORMAL LOW (ref >60)
GFR calc non Af Amer: 14 mL/min — ABNORMAL LOW (ref >60)
Glucose, Bld: 96 mg/dL (ref 65–99)
Phosphorus: 5.6 mg/dL — ABNORMAL HIGH (ref 2.5–4.6)
Potassium: 6.4 mmol/L (ref 3.5–5.1)
Sodium: 139 mmol/L (ref 135–145)

## 2018-02-21 LAB — BASIC METABOLIC PANEL
Anion gap: 11 (ref 5–15)
Anion gap: 6 (ref 5–15)
Anion gap: 6 (ref 5–15)
BUN: 88 mg/dL — ABNORMAL HIGH (ref 6–20)
BUN: 75 mg/dL — ABNORMAL HIGH (ref 6–20)
BUN: 68 mg/dL — ABNORMAL HIGH (ref 6–20)
CO2: 12 mmol/L — ABNORMAL LOW (ref 22–32)
CO2: 17 mmol/L — ABNORMAL LOW (ref 22–32)
CO2: 19 mmol/L — ABNORMAL LOW (ref 22–32)
Calcium: 9.3 mg/dL (ref 8.9–10.3)
Calcium: 8.1 mg/dL — ABNORMAL LOW (ref 8.9–10.3)
Calcium: 8.5 mg/dL — ABNORMAL LOW (ref 8.9–10.3)
Chloride: 113 mmol/L — ABNORMAL HIGH (ref 101–111)
Chloride: 115 mmol/L — ABNORMAL HIGH (ref 101–111)
Chloride: 116 mmol/L — ABNORMAL HIGH (ref 101–111)
Creatinine, Ser: 3.85 mg/dL — ABNORMAL HIGH (ref 0.44–1.00)
Creatinine, Ser: 2.9 mg/dL — ABNORMAL HIGH (ref 0.44–1.00)
Creatinine, Ser: 2.53 mg/dL — ABNORMAL HIGH (ref 0.44–1.00)
GFR calc Af Amer: 14 mL/min — ABNORMAL LOW (ref >60)
GFR calc Af Amer: 20 mL/min — ABNORMAL LOW (ref >60)
GFR calc Af Amer: 23 mL/min — ABNORMAL LOW (ref >60)
GFR calc non Af Amer: 12 mL/min — ABNORMAL LOW (ref >60)
GFR calc non Af Amer: 17 mL/min — ABNORMAL LOW (ref >60)
GFR calc non Af Amer: 20 mL/min — ABNORMAL LOW (ref >60)
Glucose, Bld: 86 mg/dL (ref 65–99)
Glucose, Bld: 107 mg/dL — ABNORMAL HIGH (ref 65–99)
Glucose, Bld: 117 mg/dL — ABNORMAL HIGH (ref 65–99)
Potassium: >7.5 mmol/L (ref 3.5–5.1)
Potassium: 5.3 mmol/L — ABNORMAL HIGH (ref 3.5–5.1)
Potassium: 4.6 mmol/L (ref 3.5–5.1)
Sodium: 136 mmol/L (ref 135–145)
Sodium: 138 mmol/L (ref 135–145)
Sodium: 141 mmol/L (ref 135–145)

## 2018-02-21 LAB — PREPARE RBC (CROSSMATCH)

## 2018-02-21 LAB — URINALYSIS, ROUTINE W REFLEX MICROSCOPIC
Bilirubin Urine: NEGATIVE
Glucose, UA: NEGATIVE mg/dL
Ketones, ur: NEGATIVE mg/dL
Nitrite: NEGATIVE
Protein, ur: NEGATIVE mg/dL
Specific Gravity, Urine: 1.015 (ref 1.005–1.030)
pH: 5 (ref 5.0–8.0)

## 2018-02-21 LAB — CBC WITH DIFFERENTIAL/PLATELET
Basophils Absolute: 0 10*3/uL (ref 0.0–0.1)
Basophils Relative: 0 %
Eosinophils Absolute: 0.2 10*3/uL (ref 0.0–0.7)
Eosinophils Relative: 2 %
HCT: 18.8 % — ABNORMAL LOW (ref 36.0–46.0)
Hemoglobin: 6.2 g/dL — CL (ref 12.0–15.0)
Lymphocytes Relative: 14 %
Lymphs Abs: 1.3 10*3/uL (ref 0.7–4.0)
MCH: 29 pg (ref 26.0–34.0)
MCHC: 33 g/dL (ref 30.0–36.0)
MCV: 87.9 fL (ref 78.0–100.0)
Monocytes Absolute: 1 10*3/uL (ref 0.1–1.0)
Monocytes Relative: 10 %
Neutro Abs: 7.1 10*3/uL (ref 1.7–7.7)
Neutrophils Relative %: 74 %
Platelets: 203 10*3/uL (ref 150–400)
RBC: 2.14 MIL/uL — ABNORMAL LOW (ref 3.87–5.11)
RDW: 17.1 % — ABNORMAL HIGH (ref 11.5–15.5)
WBC: 9.6 10*3/uL (ref 4.0–10.5)

## 2018-02-21 LAB — HEPATIC FUNCTION PANEL
ALT: 9 U/L — ABNORMAL LOW (ref 14–54)
AST: 19 U/L (ref 15–41)
Albumin: 1.9 g/dL — ABNORMAL LOW (ref 3.5–5.0)
Alkaline Phosphatase: 39 U/L (ref 38–126)
Bilirubin, Direct: 0.1 mg/dL (ref 0.1–0.5)
Indirect Bilirubin: 0.4 mg/dL (ref 0.3–0.9)
Total Bilirubin: 0.5 mg/dL (ref 0.3–1.2)
Total Protein: 5.9 g/dL — ABNORMAL LOW (ref 6.5–8.1)

## 2018-02-21 LAB — CK: Total CK: 712 U/L — ABNORMAL HIGH (ref 38–234)

## 2018-02-21 LAB — I-STAT CG4 LACTIC ACID, ED: Lactic Acid, Venous: 0.69 mmol/L (ref 0.5–1.9)

## 2018-02-21 LAB — PROTIME-INR
INR: 1.46
Prothrombin Time: 17.6 s — ABNORMAL HIGH (ref 11.4–15.2)

## 2018-02-21 LAB — SODIUM, URINE, RANDOM: Sodium, Ur: 18 mmol/L

## 2018-02-21 LAB — POTASSIUM: Potassium: >7.5 mmol/L (ref 3.5–5.1)

## 2018-02-21 LAB — APTT: aPTT: 33 s (ref 24–36)

## 2018-02-21 LAB — MRSA PCR SCREENING: MRSA by PCR: POSITIVE — AB

## 2018-02-21 LAB — LACTIC ACID, PLASMA: Lactic Acid, Venous: 0.7 mmol/L (ref 0.5–1.9)

## 2018-02-21 LAB — TROPONIN I: Troponin I: <0.03 ng/mL

## 2018-02-21 LAB — CREATININE, URINE, RANDOM: Creatinine, Urine: 95.48 mg/dL

## 2018-02-21 LAB — PROCALCITONIN: Procalcitonin: 0.38 ng/mL

## 2018-02-21 LAB — ABO/RH: ABO/RH(D): O POS

## 2018-02-21 LAB — PHOSPHORUS: Phosphorus: 4.9 mg/dL — ABNORMAL HIGH (ref 2.5–4.6)

## 2018-02-21 MED ORDER — SODIUM CHLORIDE 0.9 % IV SOLN
2.0000 g | INTRAVENOUS | Status: DC
  Administered 2018-02-21: 2 g via INTRAVENOUS
  Filled 2018-02-21: qty 2

## 2018-02-21 MED ORDER — VANCOMYCIN HCL IN DEXTROSE 1-5 GM/200ML-% IV SOLN
1000.0000 mg | INTRAVENOUS | Status: DC
Start: 2018-02-23 — End: 2018-02-21

## 2018-02-21 MED ORDER — SODIUM POLYSTYRENE SULFONATE PO POWD
30.0000 g | Freq: Once | ORAL | Status: AC
  Administered 2018-02-21: 30 g via ORAL
  Filled 2018-02-21: qty 30

## 2018-02-21 MED ORDER — SODIUM POLYSTYRENE SULFONATE PO POWD
30.0000 g | ORAL | Status: DC
  Filled 2018-02-21 (×2): qty 30

## 2018-02-21 MED ORDER — DEXTROSE 50 % IV SOLN
1.0000 | Freq: Once | INTRAVENOUS | Status: AC
  Administered 2018-02-21: 50 mL via INTRAVENOUS
  Filled 2018-02-21: qty 50

## 2018-02-21 MED ORDER — SODIUM CHLORIDE 0.9 % IV BOLUS (SEPSIS)
500.0000 mL | INTRAVENOUS | Status: AC
  Administered 2018-02-21: 500 mL via INTRAVENOUS

## 2018-02-21 MED ORDER — SODIUM CHLORIDE 0.9 % IV SOLN
2.0000 g | Freq: Once | INTRAVENOUS | Status: AC
  Administered 2018-02-21: 2 g via INTRAVENOUS
  Filled 2018-02-21: qty 20

## 2018-02-21 MED ORDER — SODIUM BICARBONATE 8.4 % IV SOLN
INTRAVENOUS | Status: DC
  Administered 2018-02-21 (×3): via INTRAVENOUS
  Filled 2018-02-21 (×4): qty 150

## 2018-02-21 MED ORDER — COLLAGENASE 250 UNIT/GM EX OINT
1.0000 | TOPICAL_OINTMENT | CUTANEOUS | Status: DC | PRN
Start: 2018-02-21 — End: 2018-02-26
  Filled 2018-02-21: qty 90

## 2018-02-21 MED ORDER — SODIUM CHLORIDE 0.9 % IV SOLN
Freq: Once | INTRAVENOUS | Status: AC
Start: 2018-02-21 — End: 2018-02-21

## 2018-02-21 MED ORDER — SODIUM CHLORIDE 0.9 % IV BOLUS (SEPSIS)
250.0000 mL | Freq: Once | INTRAVENOUS | Status: AC
  Administered 2018-02-21: 250 mL via INTRAVENOUS

## 2018-02-21 MED ORDER — SODIUM POLYSTYRENE SULFONATE 15 GM/60ML PO SUSP
30.0000 g | Freq: Once | ORAL | Status: DC | PRN
  Filled 2018-02-21: qty 120

## 2018-02-21 MED ORDER — MUPIROCIN 2 % EX OINT
1.0000 "application " | TOPICAL_OINTMENT | Freq: Two times a day (BID) | CUTANEOUS | Status: AC
  Administered 2018-02-21 – 2018-02-25 (×10): 1 via NASAL
  Filled 2018-02-21 (×2): qty 22

## 2018-02-21 MED ORDER — CALCIUM GLUCONATE 10 % IV SOLN
1.0000 g | Freq: Once | INTRAVENOUS | Status: AC
  Administered 2018-02-21: 1 g via INTRAVENOUS
  Filled 2018-02-21: qty 10

## 2018-02-21 MED ORDER — SODIUM BICARBONATE 8.4 % IV SOLN: INTRAVENOUS | Status: DC

## 2018-02-21 MED ORDER — ACETAMINOPHEN 325 MG PO TABS
650.0000 mg | ORAL_TABLET | Freq: Four times a day (QID) | ORAL | Status: DC | PRN
  Administered 2018-02-21 – 2018-02-26 (×7): 650 mg via ORAL
  Filled 2018-02-21 (×7): qty 2

## 2018-02-21 MED ORDER — SODIUM CHLORIDE 0.9 % IV BOLUS (SEPSIS): 250.0000 mL | Freq: Once | INTRAVENOUS | Status: DC

## 2018-02-21 MED ORDER — ONDANSETRON HCL 4 MG/2ML IJ SOLN
4.0000 mg | Freq: Four times a day (QID) | INTRAMUSCULAR | Status: DC | PRN
  Administered 2018-02-21: 4 mg via INTRAVENOUS
  Filled 2018-02-21: qty 2

## 2018-02-21 MED ORDER — SODIUM POLYSTYRENE SULFONATE 15 GM/60ML PO SUSP
30.0000 g | Freq: Once | ORAL | Status: AC
  Administered 2018-02-21: 30 g via ORAL
  Filled 2018-02-21: qty 120

## 2018-02-21 MED ORDER — VANCOMYCIN HCL IN DEXTROSE 1-5 GM/200ML-% IV SOLN: 1000.0000 mg | Freq: Once | INTRAVENOUS | Status: DC

## 2018-02-21 MED ORDER — NOREPINEPHRINE BITARTRATE 1 MG/ML IV SOLN
0.0000 ug/min | INTRAVENOUS | Status: DC
  Administered 2018-02-21: 2 ug/min via INTRAVENOUS
  Filled 2018-02-21: qty 4

## 2018-02-21 MED ORDER — SODIUM CHLORIDE 0.9 % IV BOLUS (SEPSIS)
1000.0000 mL | INTRAVENOUS | Status: AC
  Administered 2018-02-21: 1000 mL via INTRAVENOUS

## 2018-02-21 MED ORDER — LORATADINE 10 MG PO TABS
10.0000 mg | ORAL_TABLET | Freq: Every day | ORAL | Status: DC
  Administered 2018-02-22 – 2018-02-26 (×5): 10 mg via ORAL
  Filled 2018-02-21 (×6): qty 1

## 2018-02-21 MED ORDER — IPRATROPIUM-ALBUTEROL 0.5-2.5 (3) MG/3ML IN SOLN
3.0000 mL | RESPIRATORY_TRACT | Status: AC
  Administered 2018-02-21: 3 mL via RESPIRATORY_TRACT
  Filled 2018-02-21: qty 3

## 2018-02-21 MED ORDER — SODIUM CHLORIDE 0.9 % IV SOLN: INTRAVENOUS | Status: DC

## 2018-02-21 MED ORDER — SODIUM CHLORIDE 0.9% FLUSH
3.0000 mL | Freq: Two times a day (BID) | INTRAVENOUS | Status: DC
  Administered 2018-02-21 – 2018-02-23 (×5): 3 mL via INTRAVENOUS
  Administered 2018-02-23: 6 mL via INTRAVENOUS
  Administered 2018-02-24 – 2018-02-26 (×5): 3 mL via INTRAVENOUS

## 2018-02-21 MED ORDER — VANCOMYCIN HCL 10 G IV SOLR
2000.0000 mg | Freq: Once | INTRAVENOUS | Status: AC
  Administered 2018-02-21: 2000 mg via INTRAVENOUS
  Filled 2018-02-21: qty 2000

## 2018-02-21 MED ORDER — ATROPINE SULFATE 1 MG/10ML IJ SOSY
PREFILLED_SYRINGE | INTRAMUSCULAR | Status: AC
  Filled 2018-02-21: qty 10

## 2018-02-21 MED ORDER — IPRATROPIUM-ALBUTEROL 0.5-2.5 (3) MG/3ML IN SOLN: 3.0000 mL | RESPIRATORY_TRACT | Status: DC | PRN

## 2018-02-21 MED ORDER — ONDANSETRON HCL 4 MG PO TABS: 4.0000 mg | ORAL_TABLET | Freq: Four times a day (QID) | ORAL | Status: DC | PRN

## 2018-02-21 MED ORDER — INSULIN ASPART 100 UNIT/ML IV SOLN
10.0000 [IU] | Freq: Once | INTRAVENOUS | Status: AC
  Administered 2018-02-21: 10 [IU] via INTRAVENOUS
  Filled 2018-02-21: qty 0.1

## 2018-02-21 MED ORDER — PATIROMER SORBITEX CALCIUM 8.4 G PO PACK: 25.2000 g | PACK | Freq: Once | ORAL | Status: DC

## 2018-02-21 MED ORDER — CHLORHEXIDINE GLUCONATE CLOTH 2 % EX PADS
6.0000 | MEDICATED_PAD | Freq: Every day | CUTANEOUS | Status: DC
  Administered 2018-02-22 – 2018-02-25 (×4): 6 via TOPICAL

## 2018-02-21 MED ORDER — PANTOPRAZOLE SODIUM 40 MG IV SOLR
40.0000 mg | Freq: Two times a day (BID) | INTRAVENOUS | Status: DC
  Administered 2018-02-21 – 2018-02-24 (×7): 40 mg via INTRAVENOUS
  Filled 2018-02-21 (×7): qty 40

## 2018-02-21 MED ORDER — SODIUM CHLORIDE 0.9 % IV BOLUS (SEPSIS): 1000.0000 mL | Freq: Once | INTRAVENOUS | Status: DC

## 2018-02-21 MED ORDER — SODIUM CHLORIDE 0.9 % IV SOLN: Freq: Once | INTRAVENOUS | Status: DC

## 2018-02-21 NOTE — H&P (Signed)
History and Physical    Kari [name] BDZ:[id number] DOB: 10/21/1961 DOA: 02/20/2018  Referring MD/NP/PA: Dr. Marcha Dutton PCP: Rosita Fire, MD  Patient coming from: via EMS  Chief Complaint: Weakness  I have personally briefly reviewed patient's old medical records in [place]   HPI: Kari [name] is a 57 y.o. female with medical history significant of systolic CHF last EF 34-19%, HTN, HLD, s/p AICD, and chronic venous stasis ulcers; who presents with complaints of generalized weakness.  The patient insisted come to visit her today and found on the floor after apparent fall.  Patient notes that she is been feeling dizzy and disoriented over the last few days.  Last week having flulike symptoms with chills, nausea, vomiting, and productive cough with green sputum production.  These symptoms reportedly had resolved.  She reports having chronic venous stasis ulcers for which she has compression devices that squeeze her legs at home.  Associated symptoms include hallucinations seeing people, chronic lower extremity leg swelling, decreased oral intake and pain in her bilateral legs.  Denies having any chest pain, shortness of breath, recent subjective fevers.  She states that she is been taking her torsemide, potassium supplements, and lisinopril as prescribed.  ED Course: Upon admission into the emergency department patient was seen to be afebrile, pulse 72 and 94, respirations 15-26, blood pressure 86/32 to 130/84, O2 saturations 95-100% on room air.  Labs revealed WBC 15.3, hemoglobin 8.6, platelets 283, potassium >7.5, CO2 12, BUN 88, creatinine 3.85, glucose 113, and lactic acid 0.71.  Bilateral x-ray imaging of the lower extremities showed no acute fractures.  Chest x-ray showed cardiomegaly with vascular congestion.  Patient was given approximately 500 mL of normal saline IV fluids, 30 g of Kayexalate, calcium gluconate, amp of D50, 10 units of insulin and TRH called to admit.  Dr.  Lorrene Reid of nephrology was consulted.  Accepted patient to a stepdown bed   Review of Systems  Constitutional: Positive for malaise/fatigue.  HENT: Positive for congestion. Negative for nosebleeds.   Eyes: Negative for pain and discharge.  Respiratory: Negative for shortness of breath.   Cardiovascular: Positive for leg swelling. Negative for chest pain.  Gastrointestinal: Negative for blood in stool, diarrhea, nausea and vomiting.  Genitourinary: Negative for hematuria.  Musculoskeletal: Positive for falls and myalgias.  Skin: Positive for rash.       Positive for lower extremity wounds  Neurological: Positive for dizziness, loss of consciousness and weakness. Negative for focal weakness.  Endo/Heme/Allergies: Does not bruise/bleed easily.  Psychiatric/Behavioral: Positive for hallucinations.    Past Medical History:  Diagnosis Date  . Asthma   . CHF (congestive heart failure) ([place]) 03/12/2014   a. EF 40-45% by echo in 06/2017 with normal cors by cath. ICD placed following VT arrest  . Depression   . Headache(784.0)   . Hyperlipidemia   . Hypertension   . Lung nodules   . Renal disorder   . Sarcoidosis   . Ulcer    left posterior calf    Past Surgical History:  Procedure Laterality Date  . cataract surgery Left 07-2013  . ICD IMPLANT N/A 07/05/2017   Procedure: ICD Implant;  Surgeon: Constance Haw, MD;  Location: [facility];  Service: Cardiovascular;  Laterality: N/A;  . LEFT HEART CATH AND CORONARY ANGIOGRAPHY N/A 07/03/2017   Procedure: Left Heart Cath and Coronary Angiography;  Surgeon: Belva Crome, MD;  Location: [facility];  Service: Cardiovascular;  Laterality: N/A;  .  NO PAST SURGERIES       reports that  has never smoked. she has never used smokeless tobacco. She reports that she does not drink alcohol or use drugs.  No Known Allergies  Family History  Problem Relation Age of Onset  . Heart failure Mother   . Diabetes Mellitus II  Mother   . Hypertension Mother   . Cancer Mother        unknown type  . Heart disease Father   . Stroke Father   . Diabetes Mellitus II Father     Prior to Admission medications   Medication Sig Start Date End Date Taking? Authorizing Provider  acetaminophen (TYLENOL 8 HOUR) 650 MG CR tablet Take 1 tablet (650 mg total) by mouth 2 (two) times daily. 12/05/17  Yes Strader, [place] M, PA-C  albuterol (PROVENTIL HFA;VENTOLIN HFA) 108 (90 Base) MCG/ACT inhaler Inhale 2 puffs into the lungs every 6 (six) hours as needed for shortness of breath. 07/06/17  Yes Mikhail, Velta Addison, DO  aspirin EC 81 MG tablet Take 81 mg by mouth daily.   Yes [provider]  carvedilol (COREG) 12.5 MG tablet Take 1 tablet (12.5 mg total) by mouth 2 (two) times daily with a meal. 12/26/17  Yes Strader, [place], PA-C  collagenase (SANTYL) ointment Apply 1 application topically as needed (for wound care). Apply to wound BLE per Tx 07/06/17  Yes Mikhail, Velta Addison, DO  lisinopril (PRINIVIL,ZESTRIL) 2.5 MG tablet Take 1 tablet (2.5 mg total) by mouth daily. 12/26/17 03/26/18 Yes Strader, Fransisco Hertz, PA-C  loratadine (CLARITIN) 10 MG tablet Take 10 mg by mouth daily.   Yes [provider]  ondansetron (ZOFRAN) 4 MG tablet Take 1 tablet (4 mg total) by mouth every 6 (six) hours as needed for nausea. 04/06/17  Yes Sinda Du, MD  potassium chloride SA (KLOR-CON M20) 20 MEQ tablet Take 2 tablets (40 mEq total) by mouth daily. 12/26/17 03/26/18 Yes Strader, Fransisco Hertz, PA-C  torsemide (DEMADEX) 20 MG tablet Take 1 tablet (20 mg total) by mouth daily. 12/26/17 03/26/18 Yes Milligan, Fransisco Hertz, PA-C    Physical Exam:  Constitutional: Morbidly obese female in no acute distress at this time Vitals:   02/21/18 0000 02/21/18 0116 02/21/18 0153 02/21/18 0200  BP: (!) 107/46 (!) 107/52 130/84 (!) 94/44  Pulse: 75 86 94 84  Resp: (!) 24 16 15  (!) 22  Temp:      TempSrc:      SpO2: 99% 99% 98% 99%  Height:       Eyes:  PERRL, lids and conjunctivae normal ENMT: Mucous membranes are dry. Posterior pharynx clear of any exudate or lesions.  Neck: normal, supple, no masses, no thyromegaly Respiratory: Decreased overall aeration with some bibasilar crackles appreciated no significant wheezes or rhonchi.  Cardiovascular: Regular rate and rhythm, no murmurs / rubs / gallops.  At least 2+ pitting edema extremity edema. 2+ pedal pulses. No carotid bruits.  Abdomen: no tenderness, no masses palpated. No hepatosplenomegaly. Bowel sounds positive.  Musculoskeletal: no clubbing / cyanosis. No joint deformity upper and lower extremities. Good ROM, no contractures. Normal muscle tone.  Skin: Multiple venous stasis ulcers noted of bilateral lower extremities with pressure ulcers present as well Neurologic: CN 2-12 grossly intact. Sensation intact, DTR normal. Strength 5/5 in all 4.  Psychiatric: Normal judgment and insight. Alert and oriented x 3. Normal mood.     Labs on Admission: I have personally reviewed following labs and imaging studies  CBC: Recent Labs  Lab 02/20/18 2137  WBC 15.3*  NEUTROABS 13.0*  HGB 8.6*  HCT 26.7*  MCV 87.8  PLT 704   Basic Metabolic Panel: Recent Labs  Lab 02/20/18 2137 02/21/18 0041  NA 136  --   K >7.5* >7.5*  CL 113*  --   CO2 12*  --   GLUCOSE 86  --   BUN 88*  --   CREATININE 3.85*  --   CALCIUM 9.3  --    GFR: CrCl cannot be calculated (Unknown ideal weight.). Liver Function Tests: No results for input(s): AST, ALT, ALKPHOS, BILITOT, PROT, ALBUMIN in the last 168 hours. No results for input(s): LIPASE, AMYLASE in the last 168 hours. No results for input(s): AMMONIA in the last 168 hours. Coagulation Profile: No results for input(s): INR, PROTIME in the last 168 hours. Cardiac Enzymes: No results for input(s): CKTOTAL, CKMB, CKMBINDEX, TROPONINI in the last 168 hours. BNP (last 3 results) No results for input(s): PROBNP in the last 8760 hours. HbA1C: No  results for input(s): HGBA1C in the last 72 hours. CBG: No results for input(s): GLUCAP in the last 168 hours. Lipid Profile: No results for input(s): CHOL, HDL, LDLCALC, TRIG, CHOLHDL, LDLDIRECT in the last 72 hours. Thyroid Function Tests: No results for input(s): TSH, T4TOTAL, FREET4, T3FREE, THYROIDAB in the last 72 hours. Anemia Panel: No results for input(s): VITAMINB12, FOLATE, FERRITIN, TIBC, IRON, RETICCTPCT in the last 72 hours. Urine analysis:    Component Value Date/Time   COLORURINE YELLOW 02/20/2018 2357   APPEARANCEUR HAZY (A) 02/20/2018 2357   LABSPEC 1.015 02/20/2018 2357   PHURINE 5.0 02/20/2018 2357   GLUCOSEU NEGATIVE 02/20/2018 2357   HGBUR SMALL (A) 02/20/2018 2357   BILIRUBINUR NEGATIVE 02/20/2018 2357   KETONESUR NEGATIVE 02/20/2018 2357   PROTEINUR NEGATIVE 02/20/2018 2357   UROBILINOGEN 0.2 04/10/2014 0949   NITRITE NEGATIVE 02/20/2018 2357   LEUKOCYTESUR LARGE (A) 02/20/2018 2357   Sepsis Labs: No results found for this or any previous visit (from the past 240 hour(s)).   Radiological Exams on Admission: Dg Chest 2 View  Result Date: 02/20/2018 CLINICAL DATA:  Shortness of Breath EXAM: CHEST  2 VIEW COMPARISON:  07/19/2017 FINDINGS: Left AICD. Cardiomegaly with vascular congestion. Bibasilar atelectasis. No effusions. No acute bony abnormality. IMPRESSION: Cardiomegaly with vascular congestion.  Bibasilar atelectasis. Electronically Signed   By: Rolm Baptise M.D.   On: 02/20/2018 22:56   Dg Tibia/fibula Left  Result Date: 02/20/2018 CLINICAL DATA:  Ulcers to both legs EXAM: LEFT TIBIA AND FIBULA - 2 VIEW COMPARISON:  08/22/2015 FINDINGS: No acute bony abnormality. No fracture, subluxation or dislocation. There are areas of wavy periosteal thickening within the left fibula, likely related to chronic venous stasis. No radiographic changes of osteomyelitis. Soft tissues are intact. IMPRESSION: No acute abnormality. Electronically Signed   By: Rolm Baptise  M.D.   On: 02/20/2018 23:01   Dg Tibia/fibula Right  Result Date: 02/20/2018 CLINICAL DATA:  Bilateral lower leg ulcers. EXAM: RIGHT TIBIA AND FIBULA - 2 VIEW COMPARISON:  None. FINDINGS: Wavy periosteal thickening in the tibia and fibula, likely related to chronic venous stasis disease. No acute bony abnormality. Specifically, no fracture, subluxation, or dislocation. No radiographic changes of osteomyelitis. IMPRESSION: No acute bony abnormality. Electronically Signed   By: Rolm Baptise M.D.   On: 02/20/2018 23:02    EKG: Independently reviewed.  Normal sinus rhythm with incomplete left bundle branch block and signs of T wave peaking seen.  Assessment/Plan Hyperkalemia: Acute.  Initial potassium >  7.5 with recheck.  Early T wave peaking seen on EKG.  Patient given amp of D50 with 10 units of insulin, 1 g of calcium gluconate - Admit to a stepdown bed - Hold potassium supplementation, lisinopril, torsemide - Appreciate Dr. Lorrene Reid of nephrology consultative services, will follow-up for further recommendations - Recheck Renal function panel stat, then serial as needed - Albuterol neb stat - Will give additional Kayexalate if potassium not improved - D5 IV fluids with 3 A of sodium bicarb at 125 mL/h  Hypotension 2/2 dehydration, history of essential hypertension.Acute.  Blood pressures noted to be as low as 86/32. - Bolus 1 L of NS stat, then placed on right is seen above - IV fluids as tolerated   Acute renal failure on chronic kidney disease stage III: Patient presents with a creatinine of 3.85 and BUN 88.  Suspect prerenal cause of symptoms related to severe dehydration.  Question possibility of rhabdomyolysis. - Check CPK, Urine Cr, Urine Ur, Urine Cr.  SIRS/Sepsis 2/2 urinary tract infection: Acute.  Patient presents with symptoms concerning for sepsis.  Possible sources include urinary versus skin. - Sepsis protocol initiated - Follow-up blood and urine cultures - Empiric antibiotics  of vancomycin and ceftriaxone  Anemia : Patient's baseline hemoglobin appears to be around 10-11, but presents with a creatinine of 8.6.  Elevated BUN is possible concern for GI bleed.  Patient denies any dark stools or signs of bleeding -Type and screen for possible need of blood products - Check CBC stat -Transfuse blood hemoglobin drops below 7 or becomes symptomatic  Systolic congestive heart failure: Patient appears to be significantly  hypovolemic at this time.  Last EF noted to be 40-45%. - Strict intake and output  - daily weights  Lower extremity venous stasis ulcers,decubitus ulcers: Acute on chronic.  Also possible source of infection  - Wound care consult  - Low-air-loss mattress replacement  status post AICD  Morbid obesity  Protonix for GI prophylaxis DVT prophylaxis: SCDs  Code Status: Full  Family Communication: No family present at bedside Disposition Plan: To be determined Consults called: Nephrology  Admission status: Inpatient  Norval Morton MD [facility] Pager [phone]   If 7PM-7AM, please contact night-coverage [email or web address] Password [email or web address]  02/21/2018, 2:42 AM

## 2018-02-21 NOTE — ED Notes (Signed)
Kari [name] [phone] would like to be called when pt gets a bed.

## 2018-02-21 NOTE — Consult Note (Signed)
PULMONARY / CRITICAL CARE MEDICINE   Name: Kari [name] MRN: [id number] DOB: 1961-01-12    ADMISSION DATE:  02/20/2018 CONSULTATION DATE: 3/1  REFERRING MD:  arrien   CHIEF COMPLAINT: Hypotension, metabolic acidosis, and acute kidney injury  HISTORY OF PRESENT ILLNESS:   This is a 57 year old female patient with a complicated medical history which includes hypertension, systolic heart failure with a EF 40-45%, prior VT arrest now has ICD, tonic venous stasis ulcers, and obesity.  Presented to the emergency room on 2/28 with chief complaint of weakness, dizziness, disorientation, and even falling at home.  Initially reported having flu type symptoms about 1 week prior this included chills, nausea, vomiting, cough with green sputum the symptoms resolved however following this she had worsening oral intake, progressive weakness, and symptoms as mentioned above.  On arrival to the emergency room she was afebrile, she had mild hypotension with systolic 56/86, leukocytosis with white blood cell of 15.3, normal lactic acid, new acute on chronic kidney injury, and potassium greater than 7.5.  She was treated her hyperkalemia was treated, she was admitted to the intensive care, pulmonary asked to evaluate in collaboration with nephrology consult for: Hypotension, metabolic acidosis, and concern for clinical decline.  PAST MEDICAL HISTORY :  She  has a past medical history of Asthma, CHF (congestive heart failure) ([place]) (03/12/2014), Depression, Headache(784.0), Hyperlipidemia, Hypertension, Lung nodules, Renal disorder, Sarcoidosis, and Ulcer.  PAST SURGICAL HISTORY: She  has a past surgical history that includes No past surgeries; cataract surgery (Left, 07-2013); LEFT HEART CATH AND CORONARY ANGIOGRAPHY (N/A, 07/03/2017); and ICD IMPLANT (N/A, 07/05/2017).  No Known Allergies  No current facility-administered medications on file prior to encounter.    Current Outpatient Medications on File Prior  to Encounter  Medication Sig  . acetaminophen (TYLENOL 8 HOUR) 650 MG CR tablet Take 1 tablet (650 mg total) by mouth 2 (two) times daily.  Marland Kitchen albuterol (PROVENTIL HFA;VENTOLIN HFA) 108 (90 Base) MCG/ACT inhaler Inhale 2 puffs into the lungs every 6 (six) hours as needed for shortness of breath.  Marland Kitchen aspirin EC 81 MG tablet Take 81 mg by mouth daily.  . carvedilol (COREG) 12.5 MG tablet Take 1 tablet (12.5 mg total) by mouth 2 (two) times daily with a meal.  . collagenase (SANTYL) ointment Apply 1 application topically as needed (for wound care). Apply to wound BLE per Tx  . lisinopril (PRINIVIL,ZESTRIL) 2.5 MG tablet Take 1 tablet (2.5 mg total) by mouth daily.  Marland Kitchen loratadine (CLARITIN) 10 MG tablet Take 10 mg by mouth daily.  . ondansetron (ZOFRAN) 4 MG tablet Take 1 tablet (4 mg total) by mouth every 6 (six) hours as needed for nausea.  . potassium chloride SA (KLOR-CON M20) 20 MEQ tablet Take 2 tablets (40 mEq total) by mouth daily.  Marland Kitchen torsemide (DEMADEX) 20 MG tablet Take 1 tablet (20 mg total) by mouth daily.    FAMILY HISTORY:  Her indicated that the status of her mother is unknown. She indicated that the status of her father is unknown.   SOCIAL HISTORY: She  reports that  has never smoked. she has never used smokeless tobacco. She reports that she does not drink alcohol or use drugs.  REVIEW OF SYSTEMS:   General: Marked fatigue and malaise. HEENT he did have some congestion but no nose bleeding, this is improved.  Respiratory: No cough, wheeze, shortness of breath cardiac: Leg swelling chronic no chest pain no orthopnea GI: Negative for blood diarrhea nausea vomiting GU: Negative  for hematuria musculoskeletal has had falls and generalized muscle pain neuro: Reports dizziness, weakness, loss of consciousness.  Endocrine: No hot or cold abnormalities  SUBJECTIVE:  Feels a little better  VITAL SIGNS: BP (!) 91/30   Pulse 78   Temp 98.4 F (36.9 C) (Oral)   Resp 20   Ht 5\' 7"   (1.702 m)   Wt (!) 302 lb (137 kg)   LMP 11/21/2011   SpO2 99%   BMI 47.30 kg/m   HEMODYNAMICS:    VENTILATOR SETTINGS:    INTAKE / OUTPUT:  Intake/Output Summary (Last 24 hours) at 02/21/2018 4765 Last data filed at 02/21/2018 0600 Gross per 24 hour  Intake 2054.17 ml  Output -  Net 2054.17 ml     PHYSICAL EXAMINATION: General: Chronically ill-appearing 57 year old African-American female currently in no acute distress she is lethargic. Neuro: Awake, lethargic, oriented x3.  Moves all extremities but has generalized weakness HEENT: Normocephalic atraumatic mucous membranes now moist no jugular venous distention Cardiovascular: Regular rate and rhythm without murmur rub or gallop Lungs: Diminished throughout but no accessory use Abdomen: Obese nontender positive bowel sounds no organomegaly Musculoskeletal: Equal strength and bulk Skin: Bilateral lower extremity ulcerations which appear chronic in nature currently dressed with petroleum gauze.  LABS:  BMET Recent Labs  Lab 02/20/18 2137 02/21/18 0041 02/21/18 0756  NA 136  --  139  K >7.5* >7.5* 6.4*  CL 113*  --  118*  CO2 12*  --  14*  BUN 88*  --  86*  CREATININE 3.85*  --  3.42*  GLUCOSE 86  --  96    Electrolytes Recent Labs  Lab 02/20/18 2137 02/21/18 0756  CALCIUM 9.3 8.2*  PHOS  --  5.6*    CBC Recent Labs  Lab 02/20/18 2137 02/21/18 0756  WBC 15.3* 9.6  HGB 8.6* 6.2*  HCT 26.7* 18.8*  PLT 283 203    Coag's Recent Labs  Lab 02/21/18 0756  APTT 33  INR 1.46    Sepsis Markers Recent Labs  Lab 02/20/18 2313 02/21/18 0115 02/21/18 0756  LATICACIDVEN 0.71 0.69  --   PROCALCITON  --   --  0.38    ABG Recent Labs  Lab 02/21/18 0830  PHART 7.268*  PCO2ART 29.8*  PO2ART 92.6    Liver Enzymes Recent Labs  Lab 02/21/18 0756  AST 19  ALT 9*  ALKPHOS 39  BILITOT 0.5  ALBUMIN 1.9*  1.9*    Cardiac Enzymes Recent Labs  Lab 02/21/18 0756  TROPONINI <0.03     Glucose No results for input(s): GLUCAP in the last 168 hours.  Imaging Dg Chest 2 View  Result Date: 02/20/2018 CLINICAL DATA:  Shortness of Breath EXAM: CHEST  2 VIEW COMPARISON:  07/19/2017 FINDINGS: Left AICD. Cardiomegaly with vascular congestion. Bibasilar atelectasis. No effusions. No acute bony abnormality. IMPRESSION: Cardiomegaly with vascular congestion.  Bibasilar atelectasis. Electronically Signed   By: Rolm Baptise M.D.   On: 02/20/2018 22:56   Dg Tibia/fibula Left  Result Date: 02/20/2018 CLINICAL DATA:  Ulcers to both legs EXAM: LEFT TIBIA AND FIBULA - 2 VIEW COMPARISON:  08/22/2015 FINDINGS: No acute bony abnormality. No fracture, subluxation or dislocation. There are areas of wavy periosteal thickening within the left fibula, likely related to chronic venous stasis. No radiographic changes of osteomyelitis. Soft tissues are intact. IMPRESSION: No acute abnormality. Electronically Signed   By: Rolm Baptise M.D.   On: 02/20/2018 23:01   Dg Tibia/fibula Right  Result Date: 02/20/2018  CLINICAL DATA:  Bilateral lower leg ulcers. EXAM: RIGHT TIBIA AND FIBULA - 2 VIEW COMPARISON:  None. FINDINGS: Wavy periosteal thickening in the tibia and fibula, likely related to chronic venous stasis disease. No acute bony abnormality. Specifically, no fracture, subluxation, or dislocation. No radiographic changes of osteomyelitis. IMPRESSION: No acute bony abnormality. Electronically Signed   By: Rolm Baptise M.D.   On: 02/20/2018 23:02     STUDIES:  Renal ultrasound 3/1>>> R&L tib/fib: neg for osteo  CULTURES: Urine culture 2/28>>> Blood cultures 2/28>>>   ANTIBIOTICS: Rocephin 2/28>>> Vancomycin 2/28 x 1 dose  SIGNIFICANT EVENTS:   LINES/TUBES:   DISCUSSION:  57 year old female presenting with acute on chronic renal failure with resultant AK I, metabolic acidosis, and hyperkalemia.  I am not convinced she is actually septic, suspect this is more likely volume depletion,  complicated by taking her home antihypertensives and diuretics.  At this point it looks like she setting the right direction, her potassium is improving, her acid-base is improving.  I am not sure where her hemoglobin drop is coming from.  I see no evidence of bleeding.  Suspect this could be dilutional.  At any rate she needs transfusion, will also check anemia panel and fecal occult blood.  She will need central access given difficult IV stick   ASSESSMENT / PLAN:  Acute on Chronic renal failure .  CRI stage III (BL cr 1.7 to 1.8) Metabolic acidosis (appears primarily NAG) w/ hyperkalemia, hyperchloremia  -Suspect hemodynamic dynamically mediated AKI.  Hyperkalemia likely a result of AKI, acidosis and also patient had been taking KCl supplementation; and is on lisinopril and torsemide -Received bicarbonate, calcium chloride, insulin, and D50 as well as Kayexalate already -Arterial blood gas suggests fairly good respiratory compensation Plan Continue bicarbonate infusion Serial chemistries If creatinine continues to climb may need dialysis Place Foley catheter Strict I&O Check lactic acid Check renal ultrasound  Circulatory shock. Seems to be mix of volume depletion, possible sepsis-->suspect that this may be complicated by her metabolic acidosis.  H/o systolic HF (EF 37-90%), hypertension, and VT arrest.  Has ICD in place. Plan Transduce central venous pressure Continue IV hydration Holding home antihypertensives and diuretics Continue telemetry monitoring Follow-up culture data  Acute on chronic anemia.  No evidence of bleeding Plan Transfuse 2 units Place PlexiPulse to feet  Send fecal occult blood Send anemia panel  Probable urinary tract infection Plan Follow-up cultures Continuing ceftriaxone day #2  Chronic lower extremity venous stasis ulcers, decubitus ulcer \ plan Continue routine wound care per wound ostomy nursing Air mattress  Presumptive dx of  sarcoid Plan Pt did not want rx or diagnostics  No f/u required    FAMILY  - Updates:   - Inter-disciplinary family meet or Palliative Care meeting due by: march 8   Erick Colace ACNP-BC [place] Pager # [phone] OR # [phone] if no answer  02/21/2018, 9:36 AM

## 2018-02-21 NOTE — Progress Notes (Signed)
CRITICAL VALUE ALERT  Critical Value: hgb 6.2  Date & Time Notified: 0827 02/21/18  Provider Notified: Marni Griffon NP CCM  Orders Received/Actions taken: Will transfuse patient with PRBCs.

## 2018-02-21 NOTE — ED Notes (Signed)
[phone] Debra would like to be updated when pt is roomed Password: Nicole Kindred 2019 [phone] Fritz Pickerel

## 2018-02-21 NOTE — Progress Notes (Signed)
Notified Marni Griffon in regards of patient having no urine output at this time. Marni Griffon ordered to place a foley catheter. Also, notified Marni Griffon NP CCM in regards to ABG results, will keep sodium bicarb at current rate of 153ml/hr.

## 2018-02-21 NOTE — Progress Notes (Signed)
Brief Pharmacy Note re: potassium  Repeat K+ 5.3 after kayexalate.   Patient continues on NaBicarb infusion.  Per earlier discussion with Dr Posey Pronto, Daryll Drown will not be given today since repeat K+<6.   Orders discontinued. F/U in am.   Netta Cedars, PharmD, BCPS 02/21/2018@5 :23 PM

## 2018-02-21 NOTE — Progress Notes (Signed)
PHARMACY NOTE -  ANTIBIOTIC RENAL DOSE ADJUSTMENT   Request received for Pharmacy to assist with antibiotic renal dose adjustment.  Patient has been initiated on Ceftriaxone 2gm iv q24hr for UTI. SCr 3.85, estimated CrCl 18N ml/min Current dosage is appropriate and need for further dosage adjustment appears unlikely at present. Will sign off at this time.  Please reconsult if a change in clinical status warrants re-evaluation of dosage.

## 2018-02-21 NOTE — Consult Note (Signed)
   [facility] [place] CM Inpatient Consult   02/21/2018  KHYRA VISCUSO 21-Sep-1961 [id number]  Alert to patient's admission at [facility]. Patient is currently active with [place] [facility] for chronic disease management services.  Patient has been engaged by a [employer] and CSW.    Our community based plan of care has focused on disease management, care management for wound care needs,  and community resource support for recent loss of her friend per [employer].  Will follow for disposition needs and make Inpatient Case Manager aware that [facility] following.   Of note, [facility] Care Management services does not replace or interfere with any services that are needed or arranged by inpatient case management or social work.  For additional questions or referrals please contact:  Natividad Brood, RN BSN [facility] Liaison  [phone] business mobile phone Toll free office [phone]

## 2018-02-21 NOTE — Consult Note (Addendum)
Reason for Consult: Acute kidney injury on chronic kidney disease stage III, hyperkalemia Referring Physician: M.Arrien M.D. [facility])   HPI:  57 year old African-American woman with past medical history significant for hypertension, dyslipidemia, history of congestive heart failure (EF 40-45%), history of previous V. tach arrest with ICD placement, depression, venous stasis ulcers of the lower extremities and baseline chronic kidney disease stage IV-recent records significant for creatinine ranging 1.3-1.8 between November-December 2018.  She was brought into the emergency room after her sister found her on the floor after an apparent fall with dizziness and disorientation leading up to that.  She reports nausea/diminished oral intake prior to that but she kept on taking her medications including torsemide, potassium and lisinopril until presentation to the emergency room.  She reports some subjective fevers/chills and generalized myalgias along with feeling poorly for the past week or so and yesterday noted to have some confusion/hallucinations.  She denied any dysuria, urgency, frequency, flank pain or hematuria.  She has problems with lower leg ulcers for which she wears compression stockings versus sequential compression device.  She denies any recent iodinated intravenous contrast or regular use of NSAIDs.  Unsure about antibiotic use.  In the emergency room found to have significant hyperkalemia, anion gap metabolic acidosis and acute on chronic renal failure.  Overnight has had medical measures to lower potassium with some improvement noted of her potassium level.  Past Medical History:  Diagnosis Date  . Asthma   . CHF (congestive heart failure) ([place]) 03/12/2014   a. EF 40-45% by echo in 06/2017 with normal cors by cath. ICD placed following VT arrest  . Depression   . Headache(784.0)   . Hyperlipidemia   . Hypertension   . Lung nodules   . Renal disorder   . Sarcoidosis   . Ulcer    left  posterior calf    Past Surgical History:  Procedure Laterality Date  . cataract surgery Left 07-2013  . ICD IMPLANT N/A 07/05/2017   Procedure: ICD Implant;  Surgeon: Constance Haw, MD;  Location: [place] CV LAB;  Service: Cardiovascular;  Laterality: N/A;  . LEFT HEART CATH AND CORONARY ANGIOGRAPHY N/A 07/03/2017   Procedure: Left Heart Cath and Coronary Angiography;  Surgeon: Belva Crome, MD;  Location: [place] [facility];  Service: Cardiovascular;  Laterality: N/A;  . NO PAST SURGERIES      Family History  Problem Relation Age of Onset  . Heart failure Mother   . Diabetes Mellitus II Mother   . Hypertension Mother   . Cancer Mother        unknown type  . Heart disease Father   . Stroke Father   . Diabetes Mellitus II Father     Social History:  reports that  has never smoked. she has never used smokeless tobacco. She reports that she does not drink alcohol or use drugs.  Allergies: No Known Allergies  Medications:  Scheduled: . atropine      . loratadine  10 mg Oral Daily  . pantoprazole (PROTONIX) IV  40 mg Intravenous Q12H  . sodium chloride flush  3 mL Intravenous Q12H    BMP Latest Ref Rng & Units 02/21/2018 02/21/2018 02/20/2018  Glucose 65 - 99 mg/dL 96 - 86  BUN 6 - 20 mg/dL 86(H) - 88(H)  Creatinine 0.44 - 1.00 mg/dL 3.42(H) - 3.85(H)  Sodium 135 - 145 mmol/L 139 - 136  Potassium 3.5 - 5.1 mmol/L 6.4(HH) >7.5(HH) >7.5(HH)  Chloride 101 - 111 mmol/L  118(H) - 113(H)  CO2 22 - 32 mmol/L 14(L) - 12(L)  Calcium 8.9 - 10.3 mg/dL 8.2(L) - 9.3   CBC Latest Ref Rng & Units 02/21/2018 02/20/2018 10/30/2017  WBC 4.0 - 10.5 K/uL 9.6 15.3(H) 5.7  Hemoglobin 12.0 - 15.0 g/dL 6.2(LL) 8.6(L) 10.5(L)  Hematocrit 36.0 - 46.0 % 18.8(L) 26.7(L) 34.5(L)  Platelets 150 - 400 K/uL 203 283 203     Dg Chest 2 View  Result Date: 02/20/2018 CLINICAL DATA:  Shortness of Breath EXAM: CHEST  2 VIEW COMPARISON:  07/19/2017 FINDINGS: Left AICD. Cardiomegaly with vascular  congestion. Bibasilar atelectasis. No effusions. No acute bony abnormality. IMPRESSION: Cardiomegaly with vascular congestion.  Bibasilar atelectasis. Electronically Signed   By: Rolm Baptise M.D.   On: 02/20/2018 22:56   Dg Tibia/fibula Left  Result Date: 02/20/2018 CLINICAL DATA:  Ulcers to both legs EXAM: LEFT TIBIA AND FIBULA - 2 VIEW COMPARISON:  08/22/2015 FINDINGS: No acute bony abnormality. No fracture, subluxation or dislocation. There are areas of wavy periosteal thickening within the left fibula, likely related to chronic venous stasis. No radiographic changes of osteomyelitis. Soft tissues are intact. IMPRESSION: No acute abnormality. Electronically Signed   By: Rolm Baptise M.D.   On: 02/20/2018 23:01   Dg Tibia/fibula Right  Result Date: 02/20/2018 CLINICAL DATA:  Bilateral lower leg ulcers. EXAM: RIGHT TIBIA AND FIBULA - 2 VIEW COMPARISON:  None. FINDINGS: Wavy periosteal thickening in the tibia and fibula, likely related to chronic venous stasis disease. No acute bony abnormality. Specifically, no fracture, subluxation, or dislocation. No radiographic changes of osteomyelitis. IMPRESSION: No acute bony abnormality. Electronically Signed   By: Rolm Baptise M.D.   On: 02/20/2018 23:02   US Renal  Result Date: 02/21/2018 CLINICAL DATA:  Acute kidney injury, hypertension EXAM: RENAL / URINARY TRACT ULTRASOUND COMPLETE COMPARISON:  CT 04/01/2017 FINDINGS: Right Kidney: Length: 11.5 cm. Renal parenchyma is isoechoic compared to adjacent liver. No mass. Mild hydronephrosis. Left Kidney: Length: 12.2 cm. Echogenicity within normal limits. No mass or hydronephrosis visualized. Bladder: Distended.  Large postvoid residual, estimated 4.2 L. IMPRESSION: 1. Mild right hydronephrosis. 2. Distended urinary bladder with large postvoid residual. Electronically Signed   By: Lucrezia Europe M.D.   On: 02/21/2018 10:55   Dg Chest Port 1 View  Result Date: 02/21/2018 CLINICAL DATA:  Central line placement. EXAM:  PORTABLE CHEST 1 VIEW COMPARISON:  02/20/2018 and prior exam FINDINGS: A RIGHT IJ central venous catheter is noted with tip overlying the SUPERIOR cavoatrial junction. Cardiomegaly and LEFT-sided pacemaker again noted. Pulmonary vascular congestion and perihilar opacities are unchanged. No pneumothorax or definite pleural effusion. IMPRESSION: RIGHT IJ central venous catheter with tip overlying the SUPERIOR cavoatrial junction. There is no evidence of pneumothorax. Pulmonary vascular congestion and perihilar opacities again noted. Electronically Signed   By: Margarette Canada M.D.   On: 02/21/2018 12:26    Review of Systems  Constitutional: Positive for chills, fever and malaise/fatigue.  HENT: Negative.   Respiratory: Negative.   Cardiovascular: Positive for leg swelling. Negative for chest pain, palpitations and orthopnea.  Gastrointestinal: Positive for nausea. Negative for abdominal pain, diarrhea and vomiting.  Genitourinary: Negative.   Musculoskeletal: Positive for back pain and falls.  Skin:       Leg ulcers  Neurological: Positive for dizziness and weakness.   Blood pressure (!) 97/56, pulse 76, temperature 97.9 F (36.6 C), temperature source Core, resp. rate 20, height 5\' 7"  (1.702 m), weight (!) 137 kg (302 lb), last menstrual period 11/21/2011,  SpO2 98 %. Physical Exam  Nursing note and vitals reviewed. Constitutional: She appears well-developed and well-nourished. No distress.  HENT:  Head: Normocephalic and atraumatic.  Nose: Nose normal.  Mouth/Throat: Oropharynx is clear and moist.  Eyes: Conjunctivae and EOM are normal. Pupils are equal, round, and reactive to light. No scleral icterus.  Neck: Normal range of motion. Neck supple. No JVD present.  Cardiovascular: Normal rate, regular rhythm and normal heart sounds.  No murmur heard. Respiratory: Effort normal and breath sounds normal. She has no wheezes. She has no rales.  GI: Soft. Bowel sounds are normal. She exhibits  distension. There is tenderness. There is no rebound.  Tenderness noted over epigastric area  Musculoskeletal: She exhibits edema.  1+ edema over both legs  Neurological:  somnolent, awakens to calling out her name/examination  Skin: Skin is warm.  Shallow ulcers noted over both legs right greater than left, foul odor from right leg  Psychiatric:  Appears somnolent and somewhat lethargic    Assessment/Plan: 1.  Acute kidney injury on chronic kidney disease stage III: Most likely hemodynamically mediated based on her history, timeline of events and available database.  Initial urinalysis/urine microscopy does not point towards an acute glomerulonephritis and some improvement of renal function is seen with intravenous fluids.  Mild right hydronephrosis noted on renal ultrasound felt unlikely to be contributing to her current presentation.  Underlying chronic kidney disease likely hypertensive.  We will send off for urine electrolytes to corroborate clinical suspicion and continue intravenous fluids at this time while maintaining caution for volume overload given history of congestive heart failure.  No evidence of rhabdomyolysis. 2.  Hyperkalemia: Secondary to acute kidney injury and iatrogenic from ongoing supplementation/ACE inhibitor.  Continue efforts at forced diuresis with intravenous fluids as well as Kayexalate/Veltassa. 3.  Anion gap metabolic acidosis: Secondary to acute kidney injury, monitor on isotonic sodium bicarbonate. 4.  Anemia: Significant hemoglobin drop noted this morning following intravenous fluids, will need evaluation of occult blood loss lower GI tract as well continued monitoring for overt losses.  Getting PRBC transfusion. 5.  Hypotension: Secondary to volume depletion/residual effect of diuretic/lisinopril.  Agree with intravenous fluids and PRBC transfusion.  Cohen Boettner K. 02/21/2018, 12:42 PM

## 2018-02-21 NOTE — Progress Notes (Signed)
PROGRESS NOTE    Kari [name]  JHE:[id number] DOB: [birth year] DOA: 02/20/2018 PCP: Rosita Fire, MD    Brief Narrative:  57 year old female who presented with weakness.  She does have a significant past medical history for diastolic heart failure, hypertension, dyslipidemia, ischemic cardiomyopathy status post AICD and chronic venous stasis ulcers.  Patient complained of 48 hours of dizziness and confusion, on the day of admission she was found down on the floor, apparently after a mechanical fall.  About 7 days ago she had a flulike illness.  On the initial physical examination, she was afebrile, heart rate 94, respiratory rate 26, blood pressure 86/32, 130/84, oxygen saturation 95% on room air.  Dry mucous membranes, decreased breath sounds bilaterally with bibasilar rales, no wheezing, no rhonchi, heart S1-S2 present, rhythmic, no gallops, rubs or murmurs, abdomen soft, nontender, 2+ lower extremity pitting edema.  Sodium 136, potassium greater than 7.5, chloride 113, bicarb 12, glucose 86, BUN 88, creatinine 3.85, white count 15.3, hemoglobin 8.6, hematocrit 36.7, platelets 283, urinalysis specific gravity 1.015, 6-30 white cells, negative proteins.  Chest x-ray with hypoinflation, significant left rotation, cardiomegaly, with no infiltrates.  EKG, sinus rhythm, 81 bpm, interventricular conduction delay, no peak T waves, no QTc prolongation.   Patient was admitted to the hospital with working diagnosis of severe hyperkalemia, complicated by acute kidney injury chronic kidney disease and hypotension.   Assessment & Plan:   Principal Problem:   Hyperkalemia Active Problems:   Venous stasis ulcers (HCC)   Morbid obesity (HCC)   CHF (congestive heart failure) (HCC)   Anemia   Hypotension   Sepsis ([place])   1. AKI on CKD stage 3 complicated with hyperkalemia. Will check stat BMP, and will continue aggressive kayexalate therapy, continue hydration with bicarb drip at 100 ml per hour. No  signs of volume overload or uremia, no typical electrocardiographic changes of hyperKalemia. Patient received ca gluconate, insulin and dextrose on admission. Avoid hypotension and nephrotoxic medications, hold on Vancomycin for now. Strict in and out, monitor urine output.   2. Acute symptomatic anemia on anemia of chronic disease. No signs of active bleeding, critical hb down to 6,9, will transfuse 2 units PRBC, that will help with colloid support. Follow cell count, continue proton pump inhibitor therapy for now.   3. Lower extremities wounds. Will continue local wound care, no purulence or erythema, will continue ceftriaxone for now, hold on Vancomycin, will consult the wound care team.   4. Diastolic heart failure, EF 40 to 45%. Will continue hydration with bicarb drip, no signs of volume overload. Will continue blood pressure monitoring.   Patient is critically ill with hyperkalemia, will continue aggressive medical therapy to prevent imminent deterioration.  Critical care time 60 minutes.    DVT prophylaxis: scd  Code Status:  full Family Communication: NO family at the bedside Disposition Plan:  home   Consultants:     Procedures:     Antimicrobials:       Subjective: Patient with no confusion, no dyspnea or chest pain, no diarrhea, nausea or vomiting, no chest pain. At home uses wheelchair.   Objective: Vitals:   02/21/18 0540 02/21/18 0555 02/21/18 0600 02/21/18 0710  BP: (!) 79/23 (!) 91/30    Pulse: 73 77 78   Resp: 19 (!) 26 20   Temp:    98.4 F (36.9 C)  TempSrc:    Oral  SpO2: 95% 98% 99%   Weight:      Height:  Intake/Output Summary (Last 24 hours) at 02/21/2018 0819 Last data filed at 02/21/2018 0600 Gross per 24 hour  Intake 2054.17 ml  Output -  Net 2054.17 ml   Filed Weights   02/21/18 0330 02/21/18 0445  Weight: (!) 138.8 kg (306 lb) (!) 137 kg (302 lb)    Examination:   General: deconditioned and ill looking appearing Neurology:  somnolent but easy to arouse, non focal  E ENT: positive pallor, no icterus, oral mucosa moist Cardiovascular: No JVD. S1-S2 present, rhythmic, no gallops, rubs, or murmurs. No lower extremity edema. Pulmonary: decreased breath sounds bilaterally at bases, adequate air movement, no wheezing, rhonchi or rales. Gastrointestinal. Abdomen flat, no organomegaly, non tender, no rebound or guarding Skin. Multiple wounds at various stages anterior legs bilaterally  Musculoskeletal: no joint deformities     Data Reviewed: I have personally reviewed following labs and imaging studies  CBC: Recent Labs  Lab 02/20/18 2137  WBC 15.3*  NEUTROABS 13.0*  HGB 8.6*  HCT 26.7*  MCV 87.8  PLT 782   Basic Metabolic Panel: Recent Labs  Lab 02/20/18 2137 02/21/18 0041  NA 136  --   K >7.5* >7.5*  CL 113*  --   CO2 12*  --   GLUCOSE 86  --   BUN 88*  --   CREATININE 3.85*  --   CALCIUM 9.3  --    GFR: Estimated Creatinine Clearance: 23.6 mL/min (A) (by C-G formula based on SCr of 3.85 mg/dL (H)). Liver Function Tests: No results for input(s): AST, ALT, ALKPHOS, BILITOT, PROT, ALBUMIN in the last 168 hours. No results for input(s): LIPASE, AMYLASE in the last 168 hours. No results for input(s): AMMONIA in the last 168 hours. Coagulation Profile: No results for input(s): INR, PROTIME in the last 168 hours. Cardiac Enzymes: No results for input(s): CKTOTAL, CKMB, CKMBINDEX, TROPONINI in the last 168 hours. BNP (last 3 results) No results for input(s): PROBNP in the last 8760 hours. HbA1C: No results for input(s): HGBA1C in the last 72 hours. CBG: No results for input(s): GLUCAP in the last 168 hours. Lipid Profile: No results for input(s): CHOL, HDL, LDLCALC, TRIG, CHOLHDL, LDLDIRECT in the last 72 hours. Thyroid Function Tests: No results for input(s): TSH, T4TOTAL, FREET4, T3FREE, THYROIDAB in the last 72 hours. Anemia Panel: No results for input(s): VITAMINB12, FOLATE, FERRITIN,  TIBC, IRON, RETICCTPCT in the last 72 hours.    Radiology Studies: I have reviewed all of the imaging during this hospital visit personally     Scheduled Meds: . atropine      . loratadine  10 mg Oral Daily  . pantoprazole (PROTONIX) IV  40 mg Intravenous Q12H  . sodium chloride flush  3 mL Intravenous Q12H   Continuous Infusions: . cefTRIAXone (ROCEPHIN)  IV    .  sodium bicarbonate  infusion 1000 mL 125 mL/hr at 02/21/18 0600  . sodium chloride    . [START ON 02/23/2018] vancomycin       LOS: 0 days        Teila Skalsky Gerome Apley, MD [facility] Pager [phone]

## 2018-02-21 NOTE — Progress Notes (Signed)
Pharmacy Antibiotic Note  SHARYL PANCHAL is a 57 y.o. female admitted on 02/20/2018 with sepsis.  Pharmacy has been consulted for Vancomycin dosing.  Plan: Vancomycin 2gm iv x1, then 1gm iv q48hr Goal AUC = 400 - 500 for all indications, except meningitis (goal AUC > 500 and Cmin 15-20 mcg/mL)   Height: 5\' 7"  (170.2 cm) Weight: (!) 302 lb (137 kg) IBW/kg (Calculated) : 61.6  Temp (24hrs), Avg:98 F (36.7 C), Min:97.7 F (36.5 C), Max:98.2 F (36.8 C)  Recent Labs  Lab 02/20/18 2137 02/20/18 2313 02/21/18 0115  WBC 15.3*  --   --   CREATININE 3.85*  --   --   LATICACIDVEN  --  0.71 0.69    Estimated Creatinine Clearance: 23.6 mL/min (A) (by C-G formula based on SCr of 3.85 mg/dL (H)).    No Known Allergies  Antimicrobials this admission: Vancomycin 02/21/2018 >> Ceftriaxone 02/21/2018 >>   Dose adjustments this admission: -  Microbiology results: pending  Thank you for allowing pharmacy to be a part of this patient's care.  Nani Skillern Crowford 02/21/2018 6:50 AM

## 2018-02-21 NOTE — ED Notes (Signed)
ED TO INPATIENT HANDOFF REPORT  Name/Age/Gender Kari [name] 57 y.o. female  Code Status Code Status History    Date Active Date Inactive Code Status Order ID Comments User Context   07/20/2017 07:35 07/24/2017 17:39 Full Code [id number]  Oswald Hillock, MD Inpatient   06/26/2017 19:59 07/06/2017 16:39 Full Code [id number]  Arnell Asal, NP Inpatient   04/02/2017 02:31 04/06/2017 19:39 Full Code [id number]  Oswald Hillock, MD Inpatient   12/18/2016 00:21 12/19/2016 19:52 Full Code [id number]  Tacey Ruiz, MD Inpatient   12/18/2016 00:21 12/18/2016 00:21 Full Code [id number]  Tacey Ruiz, MD Inpatient   03/12/2014 18:41 03/15/2014 14:18 Full Code [id number]  Doree Albee, MD ED   10/08/2013 04:45 10/14/2013 17:06 Full Code [id number]  Orvan Falconer, MD Inpatient   09/02/2012 13:58 09/04/2012 16:30 Full Code [id number]  Bynum Bellows, MD Inpatient   09/02/2012 00:12 09/02/2012 13:58 Full Code [id number]  Johnna Acosta, MD ED   06/21/2012 07:48 06/25/2012 15:07 Full Code [id number]  Theodis Blaze, MD ED      Home/SNF/Other Home  Chief Complaint Leg Pain  Level of Care/Admitting Diagnosis ED Disposition    ED Disposition Condition [place]: [facility] [100102]  Level of Care: Stepdown [14]  Admit to SDU based on following criteria: Cardiac Instability:  Patients experiencing chest pain, unconfirmed MI and stable, arrhythmias and CHF requiring medical management and potentially compromising patient's stability  Diagnosis: Hyperkalemia [712458]  Admitting Physician: Norval Morton [[id number]]  Attending Physician: Norval Morton [[id number]]  Estimated length of stay: past midnight tomorrow  Certification:: I certify this patient will need inpatient services for at least 2 midnights  PT Class (Do Not Modify): Inpatient [101]  PT Acc Code (Do Not Modify): Private [1]       Medical History Past Medical History:  Diagnosis Date  . Asthma   . CHF  (congestive heart failure) ([facility]) 03/12/2014   a. EF 40-45% by echo in 06/2017 with normal cors by cath. ICD placed following VT arrest  . Depression   . Headache(784.0)   . Hyperlipidemia   . Hypertension   . Lung nodules   . Renal disorder   . Sarcoidosis   . Ulcer    left posterior calf    Allergies No Known Allergies  IV Location/Drains/Wounds Patient Lines/Drains/Airways Status   Active Line/Drains/Airways    Name:   Placement date:   Placement time:   Site:   Days:   Peripheral IV 02/20/18 Right Antecubital   02/20/18    2115    Antecubital   1   External Urinary Catheter   07/21/17    1400    -   215   Pressure Injury 04/02/17 Stage II -  Partial thickness loss of dermis presenting as a shallow open ulcer with a red, pink wound bed without slough.    04/02/17    0251     325   Pressure Injury 04/02/17 Stage I -  Intact skin with non-blanchable redness of a localized area usually over a bony prominence. pink/blanchable   04/02/17    0255     325   Wound / Incision (Open or Dehisced) 01/11/16 Venous stasis ulcer Leg Left   01/11/16    1642    Leg   772   Wound / Incision (Open or Dehisced) 04/02/17 Venous stasis ulcer Left 13cmx10cm yellow, black ulcer   04/02/17    0210    -  325   Wound / Incision (Open or Dehisced) 04/02/17 Venous stasis ulcer Leg Right 18cmlongx12cmwide yellow, red, black ulcer   04/02/17    0253    Leg   325   Wound / Incision (Open or Dehisced) 06/26/17 Venous stasis ulcer Leg Right 12 by 13 cm   06/26/17    2300    Leg   240          Labs/Imaging Results for orders placed or performed during the hospital encounter of 02/20/18 (from the past 48 hour(s))  CBC with Differential/Platelet     Status: Abnormal   Collection Time: 02/20/18  9:37 PM  Result Value Ref Range   WBC 15.3 (H) 4.0 - 10.5 K/uL   RBC 3.04 (L) 3.87 - 5.11 MIL/uL   Hemoglobin 8.6 (L) 12.0 - 15.0 g/dL   HCT 26.7 (L) 36.0 - 46.0 %   MCV 87.8 78.0 - 100.0 fL   MCH 28.3 26.0 - 34.0 pg    MCHC 32.2 30.0 - 36.0 g/dL   RDW 17.1 (H) 11.5 - 15.5 %   Platelets 283 150 - 400 K/uL   Neutrophils Relative % 85 %   Neutro Abs 13.0 (H) 1.7 - 7.7 K/uL   Lymphocytes Relative 6 %   Lymphs Abs 0.9 0.7 - 4.0 K/uL   Monocytes Relative 9 %   Monocytes Absolute 1.3 (H) 0.1 - 1.0 K/uL   Eosinophils Relative 0 %   Eosinophils Absolute 0.0 0.0 - 0.7 K/uL   Basophils Relative 0 %   Basophils Absolute 0.0 0.0 - 0.1 K/uL    Comment: Performed at [facility], [place] [address], [place], [place] [address]  Basic metabolic panel     Status: Abnormal   Collection Time: 02/20/18  9:37 PM  Result Value Ref Range   Sodium 136 135 - 145 mmol/L   Potassium >7.5 (HH) 3.5 - 5.1 mmol/L    Comment: NO VISIBLE HEMOLYSIS REPEATED TO VERIFY CRITICAL RESULT CALLED TO, READ BACK BY AND VERIFIED WITH: L ADKINS RN 0018 02/21/18 A NAVARRO    Chloride 113 (H) 101 - 111 mmol/L   CO2 12 (L) 22 - 32 mmol/L   Glucose, Bld 86 65 - 99 mg/dL   BUN 88 (H) 6 - 20 mg/dL   Creatinine, Ser 3.85 (H) 0.44 - 1.00 mg/dL   Calcium 9.3 8.9 - 10.3 mg/dL   GFR calc non Af Amer 12 (L) >60 mL/min   GFR calc Af Amer 14 (L) >60 mL/min    Comment: (NOTE) The eGFR has been calculated using the CKD EPI equation. This calculation has not been validated in all clinical situations. eGFR's persistently <60 mL/min signify possible Chronic Kidney Disease.    Anion gap 11 5 - 15    Comment: Performed at [facility], [place] [address], [place], [place] [address]  I-Stat CG4 Lactic Acid, ED     Status: None   Collection Time: 02/20/18 11:13 PM  Result Value Ref Range   Lactic Acid, Venous 0.71 0.5 - 1.9 mmol/L  Urinalysis, Routine w reflex microscopic     Status: Abnormal   Collection Time: 02/20/18 11:57 PM  Result Value Ref Range   Color, Urine YELLOW YELLOW   APPearance HAZY (A) CLEAR   Specific Gravity, Urine 1.015 1.005 - 1.030   pH 5.0 5.0 - 8.0   Glucose, UA NEGATIVE NEGATIVE mg/dL   Hgb  urine dipstick SMALL (A) NEGATIVE   Bilirubin Urine NEGATIVE NEGATIVE  Ketones, ur NEGATIVE NEGATIVE mg/dL   Protein, ur NEGATIVE NEGATIVE mg/dL   Nitrite NEGATIVE NEGATIVE   Leukocytes, UA LARGE (A) NEGATIVE   RBC / HPF 0-5 0 - 5 RBC/hpf   WBC, UA 6-30 0 - 5 WBC/hpf   Bacteria, UA RARE (A) NONE SEEN   Squamous Epithelial / LPF 0-5 (A) NONE SEEN   Mucus PRESENT    Hyaline Casts, UA PRESENT     Comment: Performed at [facility], [place] [address], [place], [place] [address]  Potassium     Status: Abnormal   Collection Time: 02/21/18 12:41 AM  Result Value Ref Range   Potassium >7.5 (HH) 3.5 - 5.1 mmol/L    Comment: NO VISIBLE HEMOLYSIS CRITICAL RESULT CALLED TO, READ BACK BY AND VERIFIED WITHAvie Echevaria RN 2542 02/21/18 A NAVARRO Performed at [facility], [place] [address] [place], [place] [address]   I-Stat CG4 Lactic Acid, ED     Status: None   Collection Time: 02/21/18  1:15 AM  Result Value Ref Range   Lactic Acid, Venous 0.69 0.5 - 1.9 mmol/L   Dg Chest 2 View  Result Date: 02/20/2018 CLINICAL DATA:  Shortness of Breath EXAM: CHEST  2 VIEW COMPARISON:  07/19/2017 FINDINGS: Left AICD. Cardiomegaly with vascular congestion. Bibasilar atelectasis. No effusions. No acute bony abnormality. IMPRESSION: Cardiomegaly with vascular congestion.  Bibasilar atelectasis. Electronically Signed   By: Rolm Baptise M.D.   On: 02/20/2018 22:56   Dg Tibia/fibula Left  Result Date: 02/20/2018 CLINICAL DATA:  Ulcers to both legs EXAM: LEFT TIBIA AND FIBULA - 2 VIEW COMPARISON:  08/22/2015 FINDINGS: No acute bony abnormality. No fracture, subluxation or dislocation. There are areas of wavy periosteal thickening within the left fibula, likely related to chronic venous stasis. No radiographic changes of osteomyelitis. Soft tissues are intact. IMPRESSION: No acute abnormality. Electronically Signed   By: Rolm Baptise M.D.   On: 02/20/2018 23:01   Dg Tibia/fibula  Right  Result Date: 02/20/2018 CLINICAL DATA:  Bilateral lower leg ulcers. EXAM: RIGHT TIBIA AND FIBULA - 2 VIEW COMPARISON:  None. FINDINGS: Wavy periosteal thickening in the tibia and fibula, likely related to chronic venous stasis disease. No acute bony abnormality. Specifically, no fracture, subluxation, or dislocation. No radiographic changes of osteomyelitis. IMPRESSION: No acute bony abnormality. Electronically Signed   By: Rolm Baptise M.D.   On: 02/20/2018 23:02    Pending Labs Unresulted Labs (From admission, onward)   Start     Ordered   02/21/18 0029  Urine Culture  Once,   STAT     02/21/18 0028      Vitals/Pain Today's Vitals   02/21/18 0200 02/21/18 0230 02/21/18 0323 02/21/18 0330  BP: (!) 94/44 (!) 100/39 (!) 92/46 (!) 98/56  Pulse: 84 79 79 83  Resp: (!) 22 (!) 23 (!) 22 20  Temp:      TempSrc:      SpO2: 99% 97% 99% 99%  Height:        Isolation Precautions No active isolations  Medications Medications  sodium chloride 0.9 % bolus 250 mL (not administered)  acetaminophen (TYLENOL) tablet 650 mg (not administered)  sodium chloride 0.9 % bolus 1,000 mL (not administered)  cefTRIAXone (ROCEPHIN) 2 g in sodium chloride 0.9 % 100 mL IVPB (not administered)  cefTRIAXone (ROCEPHIN) 2 g in sodium chloride 0.9 % 100 mL IVPB (not administered)  ipratropium-albuterol (DUONEB) 0.5-2.5 (3) MG/3ML nebulizer solution 3 mL (not administered)  sodium bicarbonate 150  mEq in dextrose 5 % 1,000 mL infusion (not administered)  sodium chloride 0.9 % bolus 250 mL (0 mLs Intravenous Stopped 02/21/18 0219)  calcium gluconate 1 g in sodium chloride 0.9 % 100 mL IVPB (0 g Intravenous Stopped 02/21/18 0115)  insulin aspart (novoLOG) injection 10 Units (10 Units Intravenous Given 02/21/18 0052)  dextrose 50 % solution 50 mL (50 mLs Intravenous Given 02/21/18 0056)  sodium polystyrene (KAYEXALATE) 15 GM/60ML suspension 30 g (30 g Oral Given 02/21/18 0039)  sodium chloride 0.9 % bolus 250 mL (0  mLs Intravenous Stopped 02/21/18 0219)    Mobility non-ambulatory

## 2018-02-21 NOTE — Patient Outreach (Addendum)
[facility]) Care Management  02/21/2018  Kari [name] [birth year] [id number]  Kari [name] an 57 y.o.femalewith a hx of hypertension, sarcoidosis, hyperlipidemia, mixed CHF with most recent echocardiogram revealing EF of 40%-45%, nonischemic cardiomyopathy, history of V. fib arrest requiring defibrillation, status post ICD implantation on 07/05/2017.  [facility] Care Management has been following Kari [name] in the community at first for Transition of Cares services after her hospital admission in July 2018 then for ongoing complex care management and care coordination needs. She is being followed by nursing and social work services.   Today, Kari [name] has been admitted to the hospital after presenting to the ED with complaint of worsening and malodorous bilateral lower extremity leg ulcers. Upon evaluation/examination she was also noted to have hyperkalemia and acute kidney injury.   Notably, Kari [name] left me a voice message Monday night telling me she had fallen at home and felt she would benefit from a walker. I attempted 3 separate return calls to her over the last 3 days and have been unable to reach her.   Bilateral Lower Extremity Wound Care - Kari [name] has been receivingtwiceweekly dressing changes (Santyl >W>D) by her home health nurse ([place]) and has been performing the daily dressing changes independently on days her nurse does not come.  [name]ens[facility]on [facility] on 12/23/17 @ 1:30pm).However, she cancelled the appointments for various reasons (out of town helping her sisterwho lost her job, unexpected death of her best friend) to include her own decision that she felt she didn't need to go. When she cancelled her last appointment, the wound care team told her she could call and reschedule at any time. When we last spoke, I advised that she discuss  this with her new primary care provider @ [facility] at her appointment on 02/14/18 which was changed to 02/17/18 which she did not attend.  Psychosocial Needs: Kari [name] is being followed closely in the community by [facility] LCSW Raynaldo Opitz.    Grief- Kari [name]'s best friend died unexpectedlyrecently. She is being followed by our LCSW who is providing support and referred Kari [name] to [facility] for ongoing grief counseling.  Housing and Transition- Kari [name] moved to her new residence in [place] from [place] says she is glad to be close to her sister and feels her new living situation is good.She is living in a boarding house in a room directly across the hall from her sister. Shared/common spaces include laundry and kitchen. She has her own bathroom.   Food resource needs - Kari [name] has been recently experiencing food needs. Our LCSW provided guidance and phone numbers to Kari [name] for [employer] and was awarded a $25 gift card for food needs from a giving resource last week.   Transportation- Ms. Houghtonhas been connected by our LCSW [facility] for [facility] to doctor appointments.   Other ongoing Medical Needs/Issues:   CHF/Volume Management- Kari [name]'slasix was discontinued and she was started on Demadex which she has been taking since 11/19/17.She has been taking Demadex as prescribed and described a dramatic uptick in urinary frequency.She continues to weigh herself daily and record and can verbalize good understanding of signs and symptoms of volume overload and when to call for weight gain or worsened symptoms.Her weight has stabilized in the last few months and stays around 295#.    Cardiology Appointment -  Kari [name] cancelled her most recent appointment on 02/10/18 with [name] because of what she felt was a viral illness. Her appointment  has been rescheduled for 02/25/18 @ 3:30pm.   Plan: I will notify the [place] Management liaisons of Kari [name]'s admission and will follow her progress closely with plans to see her at home for a face to face visit within a week of her her hospital discharge.    [facility] [facility]  [phone]

## 2018-02-21 NOTE — Consult Note (Signed)
[place] Nurse wound consult note Reason for Consult:Intertriginous dermatitis (Moisture associated skin damage) to abdominal pannus and inguinal folds, present on admission. Foley in place at this time.  Venous wounds to bilateral lower legs Wound type:MASD Venous insufficiency with nonintact lesions to bilateral lower legs.  Pressure Injury POA: N/A Measurement: Left leg:  Scattered 1 cm lesions on lateral lower leg Right leg:  2- 1 cm x 0.5 cm x 0.3 cm lesions Wound bed:25% slough, 75% pale pink nongranulating Drainage (amount, consistency, odor) minimal purulent drainage noted.  Periwound:dry skin, chronic skin changes Dressing procedure/placement/frequency:Unna boots to lower legs (ortho tech consulted) Cleanse lower legs with soap and water and apply Xeroform gauze to wound bed. Wrap with [employer] (ortho tech)  Cleanse abdominal and inguinal skin folds with soap and water and pat dry.  Apply Interdry Ag:  Measure and cut length of InterDry Ag+ to fit in skin folds that have skin breakdown Tuck InterDry  Ag+ fabric into skin folds in a single layer, allow for 2 inches of overhang from skin edges to allow for wicking to occur May remove to bathe; dry area thoroughly and then tuck into affected areas again Do not apply any creams or ointments when using InterDry Ag+ DO NOT THROW AWAY FOR 5 DAYS unless soiled with stool DO NOT [facility] product, this will inactivate the silver in the material  New sheet of Interdry Ag+ should be applied after 5 days of use if patient continues to have skin breakdown   Will not follow at this time.  Please re-consult if needed.  Domenic Moras RN BSN [place] Pager [phone]

## 2018-02-21 NOTE — Procedures (Signed)
Central Venous Catheter Insertion Procedure Note Kari [name] [id number] [birth year]  Procedure: Insertion of Central Venous Catheter Indications: Assessment of intravascular volume, Drug and/or fluid administration and Frequent blood sampling  Procedure Details Consent: Risks of procedure as well as the alternatives and risks of each were explained to the (patient/caregiver).  Consent for procedure obtained. Time Out: Verified patient identification, verified procedure, site/side was marked, verified correct patient position, special equipment/implants available, medications/allergies/relevent history reviewed, required imaging and test results available.  Performed Real time [place] used to ID and cannulate vessel.  Maximum sterile technique was used including antiseptics, cap, gloves, gown, hand hygiene, mask and sheet. Skin prep: Chlorhexidine; local anesthetic administered A antimicrobial bonded/coated triple lumen catheter was placed in the right internal jugular vein using the Seldinger technique.  Evaluation Blood flow good Complications: No apparent complications Patient did tolerate procedure well. Chest X-ray ordered to verify placement.  CXR: pending.  Kari [name] 02/21/2018, 11:48 AM  Erick Colace ACNP-BC [place] Pager # [phone] OR # [phone] if no answer

## 2018-02-22 LAB — BASIC METABOLIC PANEL
Anion gap: 7 (ref 5–15)
BUN: 55 mg/dL — ABNORMAL HIGH (ref 6–20)
CO2: 22 mmol/L (ref 22–32)
Calcium: 8.4 mg/dL — ABNORMAL LOW (ref 8.9–10.3)
Chloride: 116 mmol/L — ABNORMAL HIGH (ref 101–111)
Creatinine, Ser: 2.1 mg/dL — ABNORMAL HIGH (ref 0.44–1.00)
GFR calc Af Amer: 29 mL/min — ABNORMAL LOW (ref >60)
GFR calc non Af Amer: 25 mL/min — ABNORMAL LOW (ref >60)
Glucose, Bld: 122 mg/dL — ABNORMAL HIGH (ref 65–99)
Potassium: 4.3 mmol/L (ref 3.5–5.1)
Sodium: 145 mmol/L (ref 135–145)

## 2018-02-22 LAB — CBC
HCT: 25.4 % — ABNORMAL LOW (ref 36.0–46.0)
Hemoglobin: 8.3 g/dL — ABNORMAL LOW (ref 12.0–15.0)
MCH: 28.9 pg (ref 26.0–34.0)
MCHC: 32.7 g/dL (ref 30.0–36.0)
MCV: 88.5 fL (ref 78.0–100.0)
Platelets: 260 10*3/uL (ref 150–400)
RBC: 2.87 MIL/uL — ABNORMAL LOW (ref 3.87–5.11)
RDW: 16.6 % — ABNORMAL HIGH (ref 11.5–15.5)
WBC: 9.7 10*3/uL (ref 4.0–10.5)

## 2018-02-22 LAB — UREA NITROGEN, URINE: Urea Nitrogen, Ur: 793 mg/dL

## 2018-02-22 LAB — PHOSPHORUS: Phosphorus: 4.2 mg/dL (ref 2.5–4.6)

## 2018-02-22 MED ORDER — SODIUM CHLORIDE 0.45 % IV SOLN
INTRAVENOUS | Status: DC
  Administered 2018-02-22: 1000 mL via INTRAVENOUS
  Administered 2018-02-23: 06:00:00 via INTRAVENOUS

## 2018-02-22 MED ORDER — HYDROCODONE-ACETAMINOPHEN 5-325 MG PO TABS
1.0000 | ORAL_TABLET | Freq: Four times a day (QID) | ORAL | Status: DC | PRN
  Administered 2018-02-22: 2 via ORAL
  Filled 2018-02-22: qty 2

## 2018-02-22 MED ORDER — VANCOMYCIN HCL 10 G IV SOLR
1500.0000 mg | INTRAVENOUS | Status: DC
  Administered 2018-02-23: 1500 mg via INTRAVENOUS
  Filled 2018-02-22: qty 1500

## 2018-02-22 NOTE — Progress Notes (Signed)
PROGRESS NOTE    Kari [name]  DJS:[id number] DOB: 02-25-1961 DOA: 02/20/2018 PCP: Rosita Fire, MD    Brief Narrative:  57 year old female who presented with weakness.  She does have a significant past medical history for diastolic heart failure, hypertension, dyslipidemia, ischemic cardiomyopathy status post AICD and chronic venous stasis ulcers.  Patient complained of 48 hours of dizziness and confusion, on the day of admission she was found down on the floor, apparently after a mechanical fall.  About 7 days ago she had a flulike illness.  On the initial physical examination, she was afebrile, heart rate 94, respiratory rate 26, blood pressure 86/32, 130/84, oxygen saturation 95% on room air.  Dry mucous membranes, decreased breath sounds bilaterally with bibasilar rales, no wheezing, no rhonchi, heart S1-S2 present, rhythmic, no gallops, rubs or murmurs, abdomen soft, nontender, 2+ lower extremity pitting edema.  Sodium 136, potassium greater than 7.5, chloride 113, bicarb 12, glucose 86, BUN 88, creatinine 3.85, white count 15.3, hemoglobin 8.6, hematocrit 36.7, platelets 283, urinalysis specific gravity 1.015, 6-30 white cells, negative proteins.  Chest x-ray with hypoinflation, significant left rotation, cardiomegaly, with no infiltrates.  EKG, sinus rhythm, 81 bpm, interventricular conduction delay, no peak T waves, no QTc prolongation.   Patient was admitted to the hospital with working diagnosis of severe hyperkalemia, complicated by acute kidney injury chronic kidney disease and hypotension.   Assessment & Plan:   Principal Problem:   Hyperkalemia Active Problems:   Venous stasis ulcers (HCC)   Morbid obesity (HCC)   CHF (congestive heart failure) (HCC)   Anemia   Hypotension   Sepsis ([place])   Encounter for central line placement  1. AKI on CKD stage 3 complicated with hyperkalemia. Renal function has improved with volume resuscitation, vasopressors, and prbc transfusion  #2. Serum cr down to 2.10, with serum K down to 4,3. Noted worsening hypernatremia, serum bicarbonate at 22, cw improving acidosis. Urine output 2,700 ml over last 24 hours. Will continue IV fluids with half normal saline to prevent worsening hypernatremia. Wean off vasopressors to MAP 65.    2. Lower extremities wounds. Noted persistent purulence, patient had fever last night, MRSA screen positive. Will change antibiotic therapy to IV vancomycin, follow levels per pharmacy protocol. Continue local wound care.   2. Acute symptomatic anemia on anemia of chronic disease. SP transfusion of 2 units PRBC, no signs of active bleeding, hb up to 8,3 with hct 25. Follow cell count in am.  4. Diastolic heart failure, EF 40 to 45%. No signs of volume overload, will continue gentle hydration with hypotonic saline. Wean off vasopressors. Continue telemetry monitoring.   Patient is critically ill with hyperkalemia, will continue aggressive medical therapy to prevent imminent deterioration.  Critical care time 60 minutes.    DVT prophylaxis: scd  Code Status:  full Family Communication: NO family at the bedside Disposition Plan:  home   Consultants:     Procedures:     Antimicrobials:      Subjective: Patient is awake and alert, no dyspnea, no chest pain, mild nausea. Worsening purulence on lower extremities wounds over last few days.   Objective: Vitals:   02/22/18 0400 02/22/18 0500 02/22/18 0600 02/22/18 0758  BP: (!) 114/48 (!) 90/45 (!) 116/50   Pulse: 70 73 72   Resp: 19 (!) 23 19   Temp: (!) 100.4 F (38 C) (!) 100.4 F (38 C) 100.2 F (37.9 C) (!) 97.4 F (36.3 C)  TempSrc:    Axillary  SpO2: 96% 96% 95%   Weight:      Height:        Intake/Output Summary (Last 24 hours) at 02/22/2018 0840 Last data filed at 02/22/2018 0600 Gross per 24 hour  Intake 4046.75 ml  Output 2700 ml  Net 1346.75 ml   Filed Weights   02/21/18 0330 02/21/18 0445  Weight: (!) 138.8  kg (306 lb) (!) 137 kg (302 lb)    Examination:   General: deconditioned  Neurology: Awake and alert, non focal  E ENT: mild pallor, no icterus, oral mucosa moist/ Right IJ central venous catheter Cardiovascular: No JVD. Distant S1-S2 present, rhythmic, no gallops, rubs, or murmurs. Trace lower extremity edema. Pulmonary: decreased breath sounds bilaterally at bases, adequate air movement, no wheezing, rhonchi or rales. Gastrointestinal. Abdomen protuberant, no organomegaly, non tender, no rebound or guarding Skin. Extensive stage 3 wounds on lateral aspect of both legs, with large amount of purulent drainage, and positive odor.  Musculoskeletal: no joint deformities     Data Reviewed: I have personally reviewed following labs and imaging studies  CBC: Recent Labs  Lab 02/20/18 2137 02/21/18 0756 02/21/18 1540 02/22/18 0521  WBC 15.3* 9.6 9.3 9.7  NEUTROABS 13.0* 7.1  --   --   HGB 8.6* 6.2* 7.5* 8.3*  HCT 26.7* 18.8* 23.2* 25.4*  MCV 87.8 87.9 88.5 88.5  PLT 283 203 207 884   Basic Metabolic Panel: Recent Labs  Lab 02/20/18 2137 02/21/18 0041 02/21/18 0756 02/21/18 1540 02/21/18 2031 02/22/18 0521  NA 136  --  139 138 141 145  K >7.5* >7.5* 6.4* 5.3* 4.6 4.3  CL 113*  --  118* 115* 116* 116*  CO2 12*  --  14* 17* 19* 22  GLUCOSE 86  --  96 107* 117* 122*  BUN 88*  --  86* 75* 68* 55*  CREATININE 3.85*  --  3.42* 2.90* 2.53* 2.10*  CALCIUM 9.3  --  8.2* 8.1* 8.5* 8.4*  PHOS  --   --  5.6* 4.9*  --  4.2   GFR: Estimated Creatinine Clearance: 43.4 mL/min (A) (by C-G formula based on SCr of 2.1 mg/dL (H)). Liver Function Tests: Recent Labs  Lab 02/21/18 0756  AST 19  ALT 9*  ALKPHOS 39  BILITOT 0.5  PROT 5.9*  ALBUMIN 1.9*  1.9*   No results for input(s): LIPASE, AMYLASE in the last 168 hours. No results for input(s): AMMONIA in the last 168 hours. Coagulation Profile: Recent Labs  Lab 02/21/18 0756  INR 1.46   Cardiac Enzymes: Recent Labs  Lab  02/21/18 0756  CKTOTAL 712*  TROPONINI <0.03   BNP (last 3 results) No results for input(s): PROBNP in the last 8760 hours. HbA1C: No results for input(s): HGBA1C in the last 72 hours. CBG: No results for input(s): GLUCAP in the last 168 hours. Lipid Profile: No results for input(s): CHOL, HDL, LDLCALC, TRIG, CHOLHDL, LDLDIRECT in the last 72 hours. Thyroid Function Tests: No results for input(s): TSH, T4TOTAL, FREET4, T3FREE, THYROIDAB in the last 72 hours. Anemia Panel: No results for input(s): VITAMINB12, FOLATE, FERRITIN, TIBC, IRON, RETICCTPCT in the last 72 hours.    Radiology Studies: I have reviewed all of the imaging during this hospital visit personally     Scheduled Meds: . Chlorhexidine Gluconate Cloth  6 each Topical Q0600  . loratadine  10 mg Oral Daily  . mupirocin ointment  1 application Nasal BID  . pantoprazole (PROTONIX) IV  40 mg Intravenous Q12H  .  sodium chloride flush  3 mL Intravenous Q12H   Continuous Infusions: . sodium chloride    . cefTRIAXone (ROCEPHIN)  IV Stopped (02/21/18 2149)  . norepinephrine (LEVOPHED) Adult infusion 2 mcg/min (02/22/18 0600)  .  sodium bicarbonate  infusion 1000 mL 100 mL/hr at 02/22/18 0600  . sodium chloride       LOS: 1 day        Tawni Millers, MD [facility] Pager [phone]

## 2018-02-22 NOTE — Progress Notes (Signed)
Pharmacy Antibiotic Note  Kari [name] is a 57 y.o. female admitted on 02/20/2018 with sepsis and cellulitis.  Pharmacy has been consulted for vancomycin dosing.  Plan: 1) Vancomycin 2g x 1 given 3/1, now start vancomycin 1500mg  IV q48 first dose tomorrow 3/3 - goal AUC 400-500 2) Will monitor renal function, clinical course, cultures  Height: 5\' 7"  (170.2 cm) Weight: (!) 302 lb (137 kg) IBW/kg (Calculated) : 61.6  Temp (24hrs), Avg:99 F (37.2 C), Min:97.4 F (36.3 C), Max:100.6 F (38.1 C)  Recent Labs  Lab 02/20/18 2137 02/20/18 2313 02/21/18 0115 02/21/18 0756 02/21/18 1540 02/21/18 2031 02/22/18 0521  WBC 15.3*  --   --  9.6 9.3  --  9.7  CREATININE 3.85*  --   --  3.42* 2.90* 2.53* 2.10*  LATICACIDVEN  --  0.71 0.69  --  0.7  --   --     Estimated Creatinine Clearance: 43.4 mL/min (A) (by C-G formula based on SCr of 2.1 mg/dL (H)).    No Known Allergies   Thank you for allowing pharmacy to be a part of this patient's care.   Adrian Saran, PharmD, BCPS Pager [phone] 02/22/2018 9:24 AM

## 2018-02-22 NOTE — Progress Notes (Signed)
Patient ID: Kari [name], female   DOB: 1961/04/21, 57 y.o.   MRN: [id number] [facility] Progress Note   Assessment/ Plan:    1.  Acute kidney injury on chronic kidney disease stage III: Most likely hemodynamically mediated based on her history, timeline of events and available database-this was corroborated further by urine electrolytes and response to intravenous fluids overnight associated with improving kidney function/improve urine output.  Agree with intravenous fluid changes as made by Dr. Cathlean Sauer to help limit hyponatremia. 2.  Hyperkalemia: Secondary to acute kidney injury and iatrogenic from ongoing supplementation/ACE inhibitor.  Corrected overnight with forced diuresis and Kayexalate/GI loss.  Continue to hold ACE inhibitor at this time awaiting renal recovery 3.  Anion gap metabolic acidosis: Secondary to acute kidney injury, corrected with isotonic sodium bicarbonate. 4.  Anemia: Hemoglobin/hematocrit improved with PRBC transfusion-anticipate workup possibly as an outpatient for ongoing GI blood loss. 5.  Hypotension: Secondary to volume depletion/residual effect of diuretic/lisinopril. Low-dose pressors after blood pressures remained depressed with intravenous fluids.  Management per CCM.  With ongoing renal recovery/resolution of hyperkalemia, will sign off at this time.  Please call with questions or concerns.  Subjective:   Reports to be feeling better, concerned that her phone is not working.  Denies any chest pain or shortness of breath.   Objective:   BP (!) 104/57   Pulse 82   Temp 99.9 F (37.7 C)   Resp 19   Ht 5\' 7"  (1.702 m)   Wt (!) 137 kg (302 lb)   LMP 11/21/2011   SpO2 95%   BMI 47.30 kg/m   Intake/Output Summary (Last 24 hours) at 02/22/2018 1328 Last data filed at 02/22/2018 1100 Gross per 24 hour  Intake 3434.67 ml  Output 1850 ml  Net 1584.67 ml   Weight change:   Physical Exam: Gen: Comfortably resting in bed, watching  television CVS: Pulse regular rhythm, normal rate, S1 and S2 normal Resp: Coarse/transmitted breath sounds bilaterally without rales or rhonchi Abd: Soft, obese, nontender Ext: Trace-1+ edema over both lower extremities with shallow ulcers right greater than left leg  Imaging: Dg Chest 2 View  Result Date: 02/20/2018 CLINICAL DATA:  Shortness of Breath EXAM: CHEST  2 VIEW COMPARISON:  07/19/2017 FINDINGS: Left AICD. Cardiomegaly with vascular congestion. Bibasilar atelectasis. No effusions. No acute bony abnormality. IMPRESSION: Cardiomegaly with vascular congestion.  Bibasilar atelectasis. Electronically Signed   By: Rolm Baptise M.D.   On: 02/20/2018 22:56   Dg Tibia/fibula Left  Result Date: 02/20/2018 CLINICAL DATA:  Ulcers to both legs EXAM: LEFT TIBIA AND FIBULA - 2 VIEW COMPARISON:  08/22/2015 FINDINGS: No acute bony abnormality. No fracture, subluxation or dislocation. There are areas of wavy periosteal thickening within the left fibula, likely related to chronic venous stasis. No radiographic changes of osteomyelitis. Soft tissues are intact. IMPRESSION: No acute abnormality. Electronically Signed   By: Rolm Baptise M.D.   On: 02/20/2018 23:01   Dg Tibia/fibula Right  Result Date: 02/20/2018 CLINICAL DATA:  Bilateral lower leg ulcers. EXAM: RIGHT TIBIA AND FIBULA - 2 VIEW COMPARISON:  None. FINDINGS: Wavy periosteal thickening in the tibia and fibula, likely related to chronic venous stasis disease. No acute bony abnormality. Specifically, no fracture, subluxation, or dislocation. No radiographic changes of osteomyelitis. IMPRESSION: No acute bony abnormality. Electronically Signed   By: Rolm Baptise M.D.   On: 02/20/2018 23:02   US Renal  Result Date: 02/21/2018 CLINICAL DATA:  Acute kidney injury, hypertension EXAM: RENAL / URINARY  TRACT ULTRASOUND COMPLETE COMPARISON:  CT 04/01/2017 FINDINGS: Right Kidney: Length: 11.5 cm. Renal parenchyma is isoechoic compared to adjacent liver. No  mass. Mild hydronephrosis. Left Kidney: Length: 12.2 cm. Echogenicity within normal limits. No mass or hydronephrosis visualized. Bladder: Distended.  Large postvoid residual, estimated 4.2 L. IMPRESSION: 1. Mild right hydronephrosis. 2. Distended urinary bladder with large postvoid residual. Electronically Signed   By: Lucrezia Europe M.D.   On: 02/21/2018 10:55   Dg Chest Port 1 View  Result Date: 02/21/2018 CLINICAL DATA:  Central line placement. EXAM: PORTABLE CHEST 1 VIEW COMPARISON:  02/20/2018 and prior exam FINDINGS: A RIGHT IJ central venous catheter is noted with tip overlying the SUPERIOR cavoatrial junction. Cardiomegaly and LEFT-sided pacemaker again noted. Pulmonary vascular congestion and perihilar opacities are unchanged. No pneumothorax or definite pleural effusion. IMPRESSION: RIGHT IJ central venous catheter with tip overlying the SUPERIOR cavoatrial junction. There is no evidence of pneumothorax. Pulmonary vascular congestion and perihilar opacities again noted. Electronically Signed   By: Margarette Canada M.D.   On: 02/21/2018 12:26    Labs: BMET Recent Labs  Lab 02/20/18 2137 02/21/18 0041 02/21/18 0756 02/21/18 1540 02/21/18 2031 02/22/18 0521  NA 136  --  139 138 141 145  K >7.5* >7.5* 6.4* 5.3* 4.6 4.3  CL 113*  --  118* 115* 116* 116*  CO2 12*  --  14* 17* 19* 22  GLUCOSE 86  --  96 107* 117* 122*  BUN 88*  --  86* 75* 68* 55*  CREATININE 3.85*  --  3.42* 2.90* 2.53* 2.10*  CALCIUM 9.3  --  8.2* 8.1* 8.5* 8.4*  PHOS  --   --  5.6* 4.9*  --  4.2   CBC Recent Labs  Lab 02/20/18 2137 02/21/18 0756 02/21/18 1540 02/22/18 0521  WBC 15.3* 9.6 9.3 9.7  NEUTROABS 13.0* 7.1  --   --   HGB 8.6* 6.2* 7.5* 8.3*  HCT 26.7* 18.8* 23.2* 25.4*  MCV 87.8 87.9 88.5 88.5  PLT 283 203 207 260    Medications:    . Chlorhexidine Gluconate Cloth  6 each Topical Q0600  . loratadine  10 mg Oral Daily  . mupirocin ointment  1 application Nasal BID  . pantoprazole (PROTONIX) IV  40  mg Intravenous Q12H  . sodium chloride flush  3 mL Intravenous Q12H   Elmarie Shiley, MD 02/22/2018, 1:28 PM

## 2018-02-22 NOTE — Progress Notes (Signed)
PULMONARY / CRITICAL CARE MEDICINE   Name: Kari [name] MRN: [id number] DOB: [birth year]    ADMISSION DATE:  02/20/2018 CONSULTATION DATE: 02/21/2018  REFERRING MD:  Cathlean Sauer  CHIEF COMPLAINT:  AKI with hyperkalemia  HISTORY OF PRESENT ILLNESS:      This is a 57 year old female patient with a complicated medical history which includes hypertension, systolic heart failure with a EF 40-45%, prior VT arrest now has ICD, tonic venous stasis ulcers, and obesity.  Presented to the emergency room on 2/28 with chief complaint of weakness, dizziness, disorientation, and even falling at home.  Initially reported having flu type symptoms about 1 week prior this included chills, nausea, vomiting, cough with green sputum the symptoms resolved however following this she had worsening oral intake, progressive weakness, and symptoms as mentioned above.  On arrival to the emergency room she was afebrile, she had mild hypotension with systolic 90/24, leukocytosis with white blood cell of 15.3, normal lactic acid, new acute on chronic kidney injury, and potassium greater than 7.5.  She was treated her hyperkalemia was treated, she was admitted to the intensive care, pulmonary asked to evaluate in collaboration with nephrology consult for: Hypotension, metabolic acidosis, and concern for clinical decline.    PAST MEDICAL HISTORY :  She  has a past medical history of Asthma, CHF (congestive heart failure) ([place]) (03/12/2014), Depression, Headache(784.0), Hyperlipidemia, Hypertension, Lung nodules, Renal disorder, Sarcoidosis, and Ulcer.  PAST SURGICAL HISTORY: She  has a past surgical history that includes No past surgeries; cataract surgery (Left, 07-2013); LEFT HEART CATH AND CORONARY ANGIOGRAPHY (N/A, 07/03/2017); and ICD IMPLANT (N/A, 07/05/2017).  No Known Allergies  No current facility-administered medications on file prior to encounter.    Current Outpatient Medications on File Prior to Encounter   Medication Sig  . acetaminophen (TYLENOL 8 HOUR) 650 MG CR tablet Take 1 tablet (650 mg total) by mouth 2 (two) times daily.  Marland Kitchen albuterol (PROVENTIL HFA;VENTOLIN HFA) 108 (90 Base) MCG/ACT inhaler Inhale 2 puffs into the lungs every 6 (six) hours as needed for shortness of breath.  Marland Kitchen aspirin EC 81 MG tablet Take 81 mg by mouth daily.  . carvedilol (COREG) 12.5 MG tablet Take 1 tablet (12.5 mg total) by mouth 2 (two) times daily with a meal.  . collagenase (SANTYL) ointment Apply 1 application topically as needed (for wound care). Apply to wound BLE per Tx  . lisinopril (PRINIVIL,ZESTRIL) 2.5 MG tablet Take 1 tablet (2.5 mg total) by mouth daily.  Marland Kitchen loratadine (CLARITIN) 10 MG tablet Take 10 mg by mouth daily.  . ondansetron (ZOFRAN) 4 MG tablet Take 1 tablet (4 mg total) by mouth every 6 (six) hours as needed for nausea.  . potassium chloride SA (KLOR-CON M20) 20 MEQ tablet Take 2 tablets (40 mEq total) by mouth daily.  Marland Kitchen torsemide (DEMADEX) 20 MG tablet Take 1 tablet (20 mg total) by mouth daily.    FAMILY HISTORY:  Her indicated that the status of her mother is unknown. She indicated that the status of her father is unknown.   SOCIAL HISTORY: She  reports that  has never smoked. she has never used smokeless tobacco. She reports that she does not drink alcohol or use drugs.   SUBJECTIVE:       She is very alert and appropriately interactive this morning.  Off pressors.  No complaint of nausea or vomiting even after taking fluids.  VITAL SIGNS: BP (!) 116/50   Pulse 72   Temp (!) 97.4 F (36.3  C) (Axillary)   Resp 19   Ht 5\' 7"  (1.702 m)   Wt (!) 302 lb (137 kg)   LMP 11/21/2011   SpO2 95%   BMI 47.30 kg/m   HEMODYNAMICS: CVP:  [9 mmHg-18 mmHg] 9 mmHg  VENTILATOR SETTINGS:    INTAKE / OUTPUT: I/O last 3 completed shifts: In: 6350.9 [P.O.:720; I.V.:2911.9; Blood:619; IV [id number]:9622] Out: 2700 [Urine:2700]  PHYSICAL EXAMINATION: General: Alert and appropriately  interactive and in no apparent distress. Neuro: Oriented x3 and conversant Cardiovascular: S1 and S2 are regular and distant without murmur rub or gallop Lungs: Respirations are unlabored, there is symmetric air movement, the lungs are clear, there are no wheezes. Abdomen: Abdomen is obese and soft without any organomegaly masses or tenderness  BMET Recent Labs  Lab 02/21/18 1540 02/21/18 2031 02/22/18 0521  NA 138 141 145  K 5.3* 4.6 4.3  CL 115* 116* 116*  CO2 17* 19* 22  BUN 75* 68* 55*  CREATININE 2.90* 2.53* 2.10*  GLUCOSE 107* 117* 122*    Electrolytes Recent Labs  Lab 02/21/18 0756 02/21/18 1540 02/21/18 2031 02/22/18 0521  CALCIUM 8.2* 8.1* 8.5* 8.4*  PHOS 5.6* 4.9*  --  4.2    CBC Recent Labs  Lab 02/21/18 0756 02/21/18 1540 02/22/18 0521  WBC 9.6 9.3 9.7  HGB 6.2* 7.5* 8.3*  HCT 18.8* 23.2* 25.4*  PLT 203 207 260    Coag's Recent Labs  Lab 02/21/18 0756  APTT 33  INR 1.46    Sepsis Markers Recent Labs  Lab 02/20/18 2313 02/21/18 0115 02/21/18 0756 02/21/18 1540  LATICACIDVEN 0.71 0.69  --  0.7  PROCALCITON  --   --  0.38  --     ABG Recent Labs  Lab 02/21/18 0830  PHART 7.268*  PCO2ART 29.8*  PO2ART 92.6    Liver Enzymes Recent Labs  Lab 02/21/18 0756  AST 19  ALT 9*  ALKPHOS 39  BILITOT 0.5  ALBUMIN 1.9*  1.9*    Cardiac Enzymes Recent Labs  Lab 02/21/18 0756  TROPONINI <0.03    Glucose No results for input(s): GLUCAP in the last 168 hours.  Imaging US Renal  Result Date: 02/21/2018 CLINICAL DATA:  Acute kidney injury, hypertension EXAM: RENAL / URINARY TRACT ULTRASOUND COMPLETE COMPARISON:  CT 04/01/2017 FINDINGS: Right Kidney: Length: 11.5 cm. Renal parenchyma is isoechoic compared to adjacent liver. No mass. Mild hydronephrosis. Left Kidney: Length: 12.2 cm. Echogenicity within normal limits. No mass or hydronephrosis visualized. Bladder: Distended.  Large postvoid residual, estimated 4.2 L. IMPRESSION: 1.  Mild right hydronephrosis. 2. Distended urinary bladder with large postvoid residual. Electronically Signed   By: Lucrezia Europe M.D.   On: 02/21/2018 10:55   Dg Chest Port 1 View  Result Date: 02/21/2018 CLINICAL DATA:  Central line placement. EXAM: PORTABLE CHEST 1 VIEW COMPARISON:  02/20/2018 and prior exam FINDINGS: A RIGHT IJ central venous catheter is noted with tip overlying the SUPERIOR cavoatrial junction. Cardiomegaly and LEFT-sided pacemaker again noted. Pulmonary vascular congestion and perihilar opacities are unchanged. No pneumothorax or definite pleural effusion. IMPRESSION: RIGHT IJ central venous catheter with tip overlying the SUPERIOR cavoatrial junction. There is no evidence of pneumothorax. Pulmonary vascular congestion and perihilar opacities again noted. Electronically Signed   By: Margarette Canada M.D.   On: 02/21/2018 12:26       ANTIBIOTICS: Rocephin  DISCUSSION:    This is a 57 year old with a history of systolic heart failure and prior DVT who presented with acute on  chronic renal failure and hyperkalemia.  She had been having URI symptoms associated with nausea and vomiting and did continue to take her diuretic and ACE inhibitor.  She has been treated overnight with a bicarbonate infusion and Kayexalate and her hyperkalemia has resolved.  Her creatinine is returning to baseline.  ASSESSMENT / PLAN:  PULMONARY A: No acute issues  CARDIOVASCULAR A: Blood pressure has responded to treatment for UTI and volume, I will not look further for a cardiac provocation for hypotension unless we have persistent issues. RENAL creatinine is returning to normal and hyperkalemia has corrected with volume loading and bicarbonate.  I am going to allow her to take p.o.'s today to see if she will be able to maintain adequate intake without nausea and vomiting.  I have decreased her bicarbonate infusion by 50%.       INFECTIOUS A: A.  No culture is back.  She continues Rocephin.  Lars Masson,  MD [facility] [place] Pager: [phone]  02/22/2018, 8:06 AM

## 2018-02-23 DIAGNOSIS — R579 Shock, unspecified: Secondary | ICD-10-CM

## 2018-02-23 DIAGNOSIS — E876 Hypokalemia: Secondary | ICD-10-CM

## 2018-02-23 LAB — CBC WITH DIFFERENTIAL/PLATELET
Basophils Absolute: 0 10*3/uL (ref 0.0–0.1)
Basophils Relative: 0 %
Eosinophils Absolute: 0.5 10*3/uL (ref 0.0–0.7)
Eosinophils Relative: 6 %
HCT: 25.5 % — ABNORMAL LOW (ref 36.0–46.0)
Hemoglobin: 8.2 g/dL — ABNORMAL LOW (ref 12.0–15.0)
Lymphocytes Relative: 26 %
Lymphs Abs: 2.2 10*3/uL (ref 0.7–4.0)
MCH: 29.2 pg (ref 26.0–34.0)
MCHC: 32.2 g/dL (ref 30.0–36.0)
MCV: 90.7 fL (ref 78.0–100.0)
Monocytes Absolute: 0.7 10*3/uL (ref 0.1–1.0)
Monocytes Relative: 9 %
Neutro Abs: 4.8 10*3/uL (ref 1.7–7.7)
Neutrophils Relative %: 59 %
Platelets: 226 10*3/uL (ref 150–400)
RBC: 2.81 MIL/uL — ABNORMAL LOW (ref 3.87–5.11)
RDW: 16.7 % — ABNORMAL HIGH (ref 11.5–15.5)
WBC: 8.2 10*3/uL (ref 4.0–10.5)

## 2018-02-23 LAB — BASIC METABOLIC PANEL
Anion gap: 5 (ref 5–15)
BUN: 32 mg/dL — ABNORMAL HIGH (ref 6–20)
CO2: 23 mmol/L (ref 22–32)
Calcium: 8.2 mg/dL — ABNORMAL LOW (ref 8.9–10.3)
Chloride: 112 mmol/L — ABNORMAL HIGH (ref 101–111)
Creatinine, Ser: 1.55 mg/dL — ABNORMAL HIGH (ref 0.44–1.00)
GFR calc Af Amer: 42 mL/min — ABNORMAL LOW (ref >60)
GFR calc non Af Amer: 36 mL/min — ABNORMAL LOW (ref >60)
Glucose, Bld: 110 mg/dL — ABNORMAL HIGH (ref 65–99)
Potassium: 3.6 mmol/L (ref 3.5–5.1)
Sodium: 140 mmol/L (ref 135–145)

## 2018-02-23 MED ORDER — SODIUM CHLORIDE 0.9 % IV BOLUS (SEPSIS)
500.0000 mL | Freq: Once | INTRAVENOUS | Status: AC
  Administered 2018-02-23: 500 mL via INTRAVENOUS

## 2018-02-23 MED ORDER — VANCOMYCIN HCL IN DEXTROSE 1-5 GM/200ML-% IV SOLN
1000.0000 mg | INTRAVENOUS | Status: DC
  Administered 2018-02-24 – 2018-02-25 (×2): 1000 mg via INTRAVENOUS
  Filled 2018-02-23 (×2): qty 200

## 2018-02-23 MED ORDER — POTASSIUM CHLORIDE CRYS ER 20 MEQ PO TBCR
40.0000 meq | EXTENDED_RELEASE_TABLET | Freq: Once | ORAL | Status: AC
  Administered 2018-02-23: 40 meq via ORAL
  Filled 2018-02-23: qty 2

## 2018-02-23 MED ORDER — LACTATED RINGERS IV SOLN
INTRAVENOUS | Status: DC
  Administered 2018-02-23: 1000 mL via INTRAVENOUS
  Administered 2018-02-24: 10:00:00 via INTRAVENOUS

## 2018-02-23 NOTE — Plan of Care (Signed)
Patients BP dropped low when levo was stopped and required a fluid bolus and levo restarted.  Pressure improved and levo weaned again.  Urine output was good through shift.  Rhythm is irregular with frequent PVC s and short runs.  Awaiting labs for review.

## 2018-02-23 NOTE — Assessment & Plan Note (Signed)
Initially hyperkalemia. Now mild hypokalemia. Prior VT hx. RN says some PAC's now  Plan Po kcl x 1 x 34meq Monitor mag and phos

## 2018-02-23 NOTE — Progress Notes (Signed)
PROGRESS NOTE    Kari [name]  VCB:[id number] DOB: 14-Nov-1961 DOA: 02/20/2018 PCP: Rosita Fire, MD    Brief Narrative:  57 year old female who presented with weakness. She does have a significant past medical history for diastolic heart failure, hypertension, dyslipidemia, ischemic cardiomyopathy status post AICD and chronic venous stasis ulcers. Patient complainedof 48 hours of dizziness and confusion, on the day of admission she was found down on the floor,apparently after a mechanical fall. About 7 days ago she had a flulike illness. On the initial physical examination, she was afebrile, heart rate 94, respiratory rate26, blood pressure 86/32, 130/84, oxygen saturation 95% on room air. Dry mucous membranes, decreased breath sounds bilaterally with bibasilar rales, no wheezing, no rhonchi, heart S1-S2 present, rhythmic, no gallops, rubs or murmurs, abdomen soft, nontender, 2+ lower extremity pitting edema.Sodium 136, potassium greater than 7.5, chloride 113, bicarb 12, glucose 86, BUN 88, creatinine 3.85, white count 15.3, hemoglobin 8.6, hematocrit 36.7, platelets 283,urinalysis specific gravity 1.015, 6-30 white cells, negative proteins. Chest x-ray with hypoinflation, significant left rotation, cardiomegaly,withno infiltrates. EKG, sinus rhythm, 81 bpm, interventricular conduction delay, no peak T waves, no QTcprolongation.  Patient was admitted to the hospital with working diagnosis of severe hyperkalemia,complicated by acute kidney injury chronic kidney disease and hypotension.   Assessment & Plan:   Principal Problem:   Hyperkalemia Active Problems:   Venous stasis ulcers (HCC)   Morbid obesity (HCC)   CHF (congestive heart failure) ([facility])   Anemia   Hypotension   Sepsis ([place])   Encounter for central line placement  1.AKI on CKD stage 3 complicated with hyperkalemia. Renal function with serum cr down to 1,55, Na at 140, K 3,6 and serum bicarbonate at 23.  Urine output 2,545 ml over last 24 hours, will continue gentle hydration with hypotonic saline. Continue to wean off vasopressors, target MAP 65.    2. Lower extremities wounds with cellulitis/ present on admission/ septic shock. [place] to IV Vancomycin to cover MRSA, dressing in place with persistent purulent drainage. Will continue local wound care. Patient not ambulatory at home. Will continue IV fluids and vasopressors.   2. Acute symptomatic anemia on anemia of chronic disease. SP transfusion of 2 units PRBC. Hb and hct stable at 8,2 and 25,5. Will continue close follow of cell count, no signs of bleeding.   4. Diastolic heart failure, EF 40 to 45%. Gentle hydration with hypotonic saline. Patient had bolus of fluids this am (500 ml), will continue to wean off norepinephrine. Keep MAP 65.   Patient is critically ill with vasopressors dependent hypotension, will continue aggressive medical therapy to prevent imminent deterioration.  Critical care time 60 minutes.   DVT prophylaxis:scd Code Status:full Family Communication:NO family at the bedside Disposition Plan:home   Consultants:    Procedures:    Antimicrobials:     Subjective: Patient feeling well this, no nausea or vomiting, positive moderate lower extremity pain. No chest pain or dyspnea.   Objective: Vitals:   02/23/18 0500 02/23/18 0530 02/23/18 0600 02/23/18 0630  BP: 109/67 (!) 104/53 (!) 93/50 (!) 141/55  Pulse: 64 70 67 80  Resp: 19 17 19 17   Temp: 98.6 F (37 C) 98.6 F (37 C) 98.4 F (36.9 C) 98.4 F (36.9 C)  TempSrc:      SpO2: 95% 96% 93% 97%  Weight:  133.4 kg (294 lb)    Height:        Intake/Output Summary (Last 24 hours) at 02/23/2018 3846 Last data filed at 02/23/2018  1601 Gross per 24 hour  Intake 3162.64 ml  Output 2545 ml  Net 617.64 ml   Filed Weights   02/21/18 0330 02/21/18 0445 02/23/18 0530  Weight: (!) 138.8 kg (306 lb) (!) 137 kg (302 lb) 133.4 kg (294  lb)    Examination:   General: deconditioned  Neurology: Awake and alert, non focal  E ENT: no pallor, no icterus, oral mucosa moist/ right IJ CVC  Cardiovascular: No JVD. S1-S2 present, rhythmic, no gallops, rubs, or murmurs. No lower extremity edema. Pulmonary: vesicular breath sounds bilaterally, poor inspiratory effort, no wheezing, rhonchi or rales. Gastrointestinal. Abdomen protuberant, no organomegaly, non tender, no rebound or guarding Skin. No rashes Musculoskeletal: no joint deformities     Data Reviewed: I have personally reviewed following labs and imaging studies  CBC: Recent Labs  Lab 02/20/18 2137 02/21/18 0756 02/21/18 1540 02/22/18 0521 02/23/18 0410  WBC 15.3* 9.6 9.3 9.7 8.2  NEUTROABS 13.0* 7.1  --   --  4.8  HGB 8.6* 6.2* 7.5* 8.3* 8.2*  HCT 26.7* 18.8* 23.2* 25.4* 25.5*  MCV 87.8 87.9 88.5 88.5 90.7  PLT 283 203 207 260 093   Basic Metabolic Panel: Recent Labs  Lab 02/21/18 0756 02/21/18 1540 02/21/18 2031 02/22/18 0521 02/23/18 0410  NA 139 138 141 145 140  K 6.4* 5.3* 4.6 4.3 3.6  CL 118* 115* 116* 116* 112*  CO2 14* 17* 19* 22 23  GLUCOSE 96 107* 117* 122* 110*  BUN 86* 75* 68* 55* 32*  CREATININE 3.42* 2.90* 2.53* 2.10* 1.55*  CALCIUM 8.2* 8.1* 8.5* 8.4* 8.2*  PHOS 5.6* 4.9*  --  4.2  --    GFR: Estimated Creatinine Clearance: 57.8 mL/min (A) (by C-G formula based on SCr of 1.55 mg/dL (H)). Liver Function Tests: Recent Labs  Lab 02/21/18 0756  AST 19  ALT 9*  ALKPHOS 39  BILITOT 0.5  PROT 5.9*  ALBUMIN 1.9*  1.9*   No results for input(s): LIPASE, AMYLASE in the last 168 hours. No results for input(s): AMMONIA in the last 168 hours. Coagulation Profile: Recent Labs  Lab 02/21/18 0756  INR 1.46   Cardiac Enzymes: Recent Labs  Lab 02/21/18 0756  CKTOTAL 712*  TROPONINI <0.03   BNP (last 3 results) No results for input(s): PROBNP in the last 8760 hours. HbA1C: No results for input(s): HGBA1C in the last 72  hours. CBG: No results for input(s): GLUCAP in the last 168 hours. Lipid Profile: No results for input(s): CHOL, HDL, LDLCALC, TRIG, CHOLHDL, LDLDIRECT in the last 72 hours. Thyroid Function Tests: No results for input(s): TSH, T4TOTAL, FREET4, T3FREE, THYROIDAB in the last 72 hours. Anemia Panel: No results for input(s): VITAMINB12, FOLATE, FERRITIN, TIBC, IRON, RETICCTPCT in the last 72 hours.    Radiology Studies: I have reviewed all of the imaging during this hospital visit personally     Scheduled Meds: . Chlorhexidine Gluconate Cloth  6 each Topical Q0600  . loratadine  10 mg Oral Daily  . mupirocin ointment  1 application Nasal BID  . pantoprazole (PROTONIX) IV  40 mg Intravenous Q12H  . sodium chloride flush  3 mL Intravenous Q12H   Continuous Infusions: . sodium chloride 50 mL/hr at 02/23/18 0546  . norepinephrine (LEVOPHED) Adult infusion 2 mcg/min (02/23/18 0651)  . sodium chloride    . vancomycin 1,500 mg (02/23/18 0546)     LOS: 2 days        Kari [name] Kari Apley, MD [facility] Pager [phone]

## 2018-02-23 NOTE — Progress Notes (Signed)
Pharmacy Antibiotic Note  Kari [name] is a 57 y.o. female admitted on 02/20/2018 with sepsis and cellulitis.  Pharmacy has been consulted for vancomycin dosing.  Plan: Day 3 Antibiotics 1) Due to continued improvement in SCr, will change vanc from 1500mg  IV q48 to 1g  IV q24 - goal AUC still 400-500 2) Will monitor renal function, clinical course, cultures  Height: 5\' 7"  (170.2 cm) Weight: 294 lb (133.4 kg) IBW/kg (Calculated) : 61.6  Temp (24hrs), Avg:99.6 F (37.6 C), Min:98.4 F (36.9 C), Max:100 F (37.8 C)  Recent Labs  Lab 02/20/18 2137 02/20/18 2313 02/21/18 0115 02/21/18 0756 02/21/18 1540 02/21/18 2031 02/22/18 0521 02/23/18 0410  WBC 15.3*  --   --  9.6 9.3  --  9.7 8.2  CREATININE 3.85*  --   --  3.42* 2.90* 2.53* 2.10* 1.55*  LATICACIDVEN  --  0.71 0.69  --  0.7  --   --   --     Estimated Creatinine Clearance: 57.8 mL/min (A) (by C-G formula based on SCr of 1.55 mg/dL (H)).    No Known Allergies   Thank you for allowing pharmacy to be a part of this patient's care.   Adrian Saran, PharmD, BCPS Pager [phone] 02/23/2018 9:51 AM

## 2018-02-23 NOTE — Assessment & Plan Note (Signed)
resolvd with improvement in AKI  Plan Dc levophed from MAR MAP goal > 65 Change half NS to LR at 50cc/h Monitopr

## 2018-02-23 NOTE — Progress Notes (Signed)
PULMONARY / CRITICAL CARE MEDICINE   Name: Kari [name] MRN: [id number] DOB: 09/12/1961    ADMISSION DATE:  02/20/2018 CONSULTATION DATE: 02/21/2018  REFERRING MD:  Cathlean Sauer  CHIEF COMPLAINT:  AKI with hyperkalemia  brief    This is a 57 year old female patient with a complicated medical history which includes hypertension, systolic heart failure with a EF 40-45%, prior VT arrest now has ICD, tonic venous stasis ulcers, and obesity.  Presented to the emergency room on 2/28 with chief complaint of weakness, dizziness, disorientation, and even falling at home.  Initially reported having flu type symptoms about 1 week prior this included chills, nausea, vomiting, cough with green sputum the symptoms resolved however following this she had worsening oral intake, progressive weakness, and symptoms as mentioned above.  On arrival to the emergency room she was afebrile, she had mild hypotension with systolic 53/66, leukocytosis with white blood cell of 15.3, normal lactic acid, new acute on chronic kidney injury, and potassium greater than 7.5.  She was treated her hyperkalemia was treated, she was admitted to the intensive care, pulmonary asked to evaluate in collaboration with nephrology consult for: Hypotension, metabolic acidosis, and concern for clinical decline.    PAST MEDICAL HISTORY :  She  has a past medical history of Asthma, CHF (congestive heart failure) ([place]) (03/12/2014), Depression, Headache(784.0), Hyperlipidemia, Hypertension, Lung nodules, Renal disorder, Sarcoidosis, and Ulcer.  PAST SURGICAL HISTORY: She  has a past surgical history that includes No past surgeries; cataract surgery (Left, 07-2013); LEFT HEART CATH AND CORONARY ANGIOGRAPHY (N/A, 07/03/2017); and ICD IMPLANT (N/A, 07/05/2017).   EVENTS 02/20/2018  -ad mit 3/1 - pccm consult 3/2 -      She is very alert and appropriately interactive this morning.  Off pressors.  No complaint of nausea or vomiting even after taking  fluids.   SUBJECTIVE/OVERNIGHT/INTERVAL HX 02/23/2018 - off levphed. Feeling well. Watching TV. Per RN she ate some. Per patient - feeling btter other than some grogginess related to vicodin last night  VITAL SIGNS: BP (!) 90/49   Pulse 65   Temp 98.8 F (37.1 C)   Resp 19   Ht 5\' 7"  (1.702 m)   Wt 133.4 kg (294 lb)   LMP 11/21/2011   SpO2 94%   BMI 46.05 kg/m   HEMODYNAMICS: CVP:  [10 mmHg-14 mmHg] 13 mmHg  VENTILATOR SETTINGS:    INTAKE / OUTPUT: I/O last 3 completed shifts: In: 5168.9 [P.O.:1680; I.V.:2888.9; IV Piggyback:600] Out: 4403 [Urine:3645]  PHYSICAL EXAMINATION:   General Appearance:    Looks criticall ill OBESE - yes  Head:    Normocephalic, without obvious abnormality, atraumatic  Eyes:    PERRL - yes, conjunctiva/corneas -  lear      Ears:    Normal external ear canals, both ears  Nose:   NG tube - no  Throat:  ETT TUBE - no , OG tube - no  Neck:   Supple,  No enlargement/tenderness/nodules     Lungs:     Clear to auscultation bilaterally,   Chest wall:    No deformity  Heart:    S1 and S2 normal, no murmur, CVP - no.  Pressors - no  Abdomen:     Soft, no masses, no organomegaly  Genitalia:    Not done  Rectal:   not done  Extremities:   Extremities-  bialteral LE in ACE wrate, severe edema     Skin:   Intact in exposed areas .  Neurologic:   Sedation - none -> RASS - x . Moves all 4s - yes. CAM-ICU - eng . Orientation - x3+ and alert      PULMONARY Recent Labs  Lab 02/21/18 0830  PHART 7.268*  PCO2ART 29.8*  PO2ART 92.6  HCO3 13.2*  O2SAT 96.6    CBC Recent Labs  Lab 02/21/18 1540 02/22/18 0521 02/23/18 0410  HGB 7.5* 8.3* 8.2*  HCT 23.2* 25.4* 25.5*  WBC 9.3 9.7 8.2  PLT 207 260 226    COAGULATION Recent Labs  Lab 02/21/18 0756  INR 1.46    CARDIAC   Recent Labs  Lab 02/21/18 0756  TROPONINI <0.03   No results for input(s): PROBNP in the last 168 hours.   CHEMISTRY Recent Labs  Lab 02/21/18 0756  02/21/18 1540 02/21/18 2031 02/22/18 0521 02/23/18 0410  NA 139 138 141 145 140  K 6.4* 5.3* 4.6 4.3 3.6  CL 118* 115* 116* 116* 112*  CO2 14* 17* 19* 22 23  GLUCOSE 96 107* 117* 122* 110*  BUN 86* 75* 68* 55* 32*  CREATININE 3.42* 2.90* 2.53* 2.10* 1.55*  CALCIUM 8.2* 8.1* 8.5* 8.4* 8.2*  PHOS 5.6* 4.9*  --  4.2  --    Estimated Creatinine Clearance: 57.8 mL/min (A) (by C-G formula based on SCr of 1.55 mg/dL (H)).   LIVER Recent Labs  Lab 02/21/18 0756  AST 19  ALT 9*  ALKPHOS 39  BILITOT 0.5  PROT 5.9*  ALBUMIN 1.9*  1.9*  INR 1.46     INFECTIOUS Recent Labs  Lab 02/20/18 2313 02/21/18 0115 02/21/18 0756 02/21/18 1540  LATICACIDVEN 0.71 0.69  --  0.7  PROCALCITON  --   --  0.38  --      ENDOCRINE CBG (last 3)  No results for input(s): GLUCAP in the last 72 hours.    Results for orders placed or performed during the hospital encounter of 02/20/18  MRSA PCR Screening     Status: Abnormal   Collection Time: 02/21/18  5:33 AM  Result Value Ref Range Status   MRSA by PCR POSITIVE (A) NEGATIVE Final    Comment:        The GeneXpert MRSA Assay (FDA approved for NASAL specimens only), is one component of a comprehensive MRSA colonization surveillance program. It is not intended to diagnose MRSA infection nor to guide or monitor treatment for MRSA infections. RESULT CALLED TO, READ BACK BY AND VERIFIED WITH: KOONTZ,A @ [address] [id number] BY POTEAT,S Performed at [facility] [address] [place], [place] [address]   Culture, blood (Routine X 2) w Reflex to ID Panel     Status: None (Preliminary result)   Collection Time: 02/21/18 11:40 AM  Result Value Ref Range Status   Specimen Description   Final    BLOOD SITE NOT SPECIFIED Performed at [facility], [place] [address], [place], [place] [address]    Special Requests   Final    BOTTLES DRAWN AEROBIC AND ANAEROBIC Blood Culture adequate volume Performed at [facility] [address], [place], [place] [address]    Culture   Final    NO GROWTH 2 DAYS Performed at [facility] [address] [place], [place] [address]    Report Status PENDING  Incomplete      IMAGING x48h  - image(s) personally visualized  -   highlighted in bold No results found.     DISCUSSION:  This is a 57 year old with a history of systolic heart failure and prior DVT who presented with acute on chronic renal failure and hyperkalemia.  She had been having URI symptoms associated with nausea and vomiting and did continue to take her diuretic and ACE inhibitor.  She has been treated overnight with a bicarbonate infusion and Kayexalate and her hyperkalemia has resolved.  Her creatinine is returning to baseline.  ASSESSMENT / PLAN: Shock circulatory ([place]) resolvd with improvement in AKI  Plan Dc levophed from MAR MAP goal > 65 Change half NS to LR at 50cc/h Monitopr  Hypokalemia Initially hyperkalemia. Now mild hypokalemia. Prior VT hx. RN says some PAC's now  Plan Po kcl x 1 x 60meq Monitor mag and phos    PCCM will sign off  Dr. Brand Males, M.D., [facility].C.P Pulmonary and Critical Care Medicine Staff Physician, [place] Director - Interstitial Lung Disease  Program  Pulmonary [facility] [place], [place], [address]  Pager: [phone], If no answer or between  15:00h - 7:00h: call [phone] Telephone: [phone]

## 2018-02-24 ENCOUNTER — Other Ambulatory Visit: Payer: Self-pay

## 2018-02-24 DIAGNOSIS — L03119 Cellulitis of unspecified part of limb: Secondary | ICD-10-CM

## 2018-02-24 LAB — BASIC METABOLIC PANEL
Anion gap: 3 — ABNORMAL LOW (ref 5–15)
BUN: 18 mg/dL (ref 6–20)
CO2: 25 mmol/L (ref 22–32)
Calcium: 8.2 mg/dL — ABNORMAL LOW (ref 8.9–10.3)
Chloride: 114 mmol/L — ABNORMAL HIGH (ref 101–111)
Creatinine, Ser: 1.26 mg/dL — ABNORMAL HIGH (ref 0.44–1.00)
GFR calc Af Amer: 54 mL/min — ABNORMAL LOW (ref >60)
GFR calc non Af Amer: 47 mL/min — ABNORMAL LOW (ref >60)
Glucose, Bld: 102 mg/dL — ABNORMAL HIGH (ref 65–99)
Potassium: 3.9 mmol/L (ref 3.5–5.1)
Sodium: 142 mmol/L (ref 135–145)

## 2018-02-24 LAB — CBC WITH DIFFERENTIAL/PLATELET
Basophils Absolute: 0 10*3/uL (ref 0.0–0.1)
Basophils Relative: 1 %
Eosinophils Absolute: 0.6 10*3/uL (ref 0.0–0.7)
Eosinophils Relative: 8 %
HCT: 25.9 % — ABNORMAL LOW (ref 36.0–46.0)
Hemoglobin: 8.2 g/dL — ABNORMAL LOW (ref 12.0–15.0)
Lymphocytes Relative: 22 %
Lymphs Abs: 1.5 10*3/uL (ref 0.7–4.0)
MCH: 28.6 pg (ref 26.0–34.0)
MCHC: 31.7 g/dL (ref 30.0–36.0)
MCV: 90.2 fL (ref 78.0–100.0)
Monocytes Absolute: 0.7 10*3/uL (ref 0.1–1.0)
Monocytes Relative: 10 %
Neutro Abs: 4.2 10*3/uL (ref 1.7–7.7)
Neutrophils Relative %: 59 %
Platelets: 199 10*3/uL (ref 150–400)
RBC: 2.87 MIL/uL — ABNORMAL LOW (ref 3.87–5.11)
RDW: 16.1 % — ABNORMAL HIGH (ref 11.5–15.5)
WBC: 7.2 10*3/uL (ref 4.0–10.5)

## 2018-02-24 LAB — TROPONIN I: Troponin I: <0.03 ng/mL (ref <0.03)

## 2018-02-24 LAB — PHOSPHORUS: Phosphorus: 2.1 mg/dL — ABNORMAL LOW (ref 2.5–4.6)

## 2018-02-24 LAB — PROCALCITONIN: Procalcitonin: <0.1 ng/mL

## 2018-02-24 LAB — MAGNESIUM: Magnesium: 2 mg/dL (ref 1.7–2.4)

## 2018-02-24 MED ORDER — FAMOTIDINE 20 MG PO TABS
20.0000 mg | ORAL_TABLET | Freq: Every day | ORAL | Status: DC
Start: 2018-02-25 — End: 2018-02-26
  Administered 2018-02-25 – 2018-02-26 (×2): 20 mg via ORAL
  Filled 2018-02-24 (×2): qty 1

## 2018-02-24 NOTE — Progress Notes (Signed)
Assumed care of pt @ 1830.  I agree with previous RN's assessment.  Will continue with plan of care.

## 2018-02-24 NOTE — Progress Notes (Signed)
PROGRESS NOTE    Kari [name]  PJK:[id number] DOB: [birth year] DOA: 02/20/2018 PCP: Rosita Fire, MD    Brief Narrative:  57 year old female who presented with weakness. She does have a significant past medical history for diastolic heart failure, hypertension, dyslipidemia, ischemic cardiomyopathy status post AICD and chronic venous stasis ulcers. Patient complainedof 48 hours of dizziness and confusion, on the day of admission she was found down on the floor,apparently after a mechanical fall. About 7 days ago she had a flulike illness. On the initial physical examination, she was afebrile, heart rate 94, respiratory rate26, blood pressure 86/32, 130/84, oxygen saturation 95% on room air. Dry mucous membranes, decreased breath sounds bilaterally with bibasilar rales, no wheezing, no rhonchi, heart S1-S2 present, rhythmic, no gallops, rubs or murmurs, abdomen soft, nontender, 2+ lower extremity pitting edema.Sodium 136, potassium greater than 7.5, chloride 113, bicarb 12, glucose 86, BUN 88, creatinine 3.85, white count 15.3, hemoglobin 8.6, hematocrit 36.7, platelets 283,urinalysis specific gravity 1.015, 6-30 white cells, negative proteins. Chest x-ray with hypoinflation, significant left rotation, cardiomegaly,withno infiltrates. EKG, sinus rhythm, 81 bpm, interventricular conduction delay, no peak T waves, no QTcprolongation.  Patient was admitted to the hospital with working diagnosis of severe hyperkalemia,complicated by acute kidney injury chronic kidney disease and hypotension   Assessment & Plan:   Principal Problem:   Hyperkalemia Active Problems:   Venous stasis ulcers (HCC)   Morbid obesity (HCC)   CHF (congestive heart failure) (HCC)   Hypokalemia   Anemia   Shock circulatory ([place])   Hypotension   Sepsis ([place])   Encounter for central line placement   1.AKI on CKD stage 3 complicated with hyperkalemia.continue to improve renal function with serum  cr down to 1,26, K at 3,9 and serum bicarbonate 25. Patient tolerating well po, with no nausea or vomiting, will discontinue IV fluids. Follow on renal panel in am. Mg at 2.0 and phos 2.1.   2. Lower extremities wounds with cellulitis/ present on admission/ septic shock.Improved wounds secretion and purulence, will continue with vancomycin IV and will plan to complete antibiotic therapy with MRSA coverage oral antibiotic therapy. Follow on wound care recommendations. Out of bed as tolerated and physical therapy evaluation. Patient of vasopressors.   2. Acute symptomatic anemia on anemia of chronic disease.SPtransfusion of2 units PRBC. Hb continue to be stable at 8,2 with Hct 25.9. Suspected blood loss due to bilateral large ulcerated wounds, no clinical evidence of gastrointestinal bleeding. Will continue prophylactic antiacid therapy with ranitidine. DC pantoprazole.   4. Diastolic heart failure, EF 40 to 45%, no signs of exacerbation. Will hold on IV fluids, will continue to hold on diuretics.    DVT prophylaxis:scd Code Status:full Family Communication:I spoke with patient's family at the bedside and all questions were addressed.  Disposition Plan:home   Consultants:    Procedures:    Antimicrobials:     Subjective: Patient is feeling better, no nausea or vomiting, no chest pain or dyspnea, tolerating po well.   Objective: Vitals:   02/24/18 0400 02/24/18 0500 02/24/18 0600 02/24/18 0800  BP: (!) 81/49 121/65 106/67   Pulse: 69 72    Resp:      Temp: 99.5 F (37.5 C) 99.5 F (37.5 C)  98.6 F (37 C)  TempSrc:    Oral  SpO2: (!) 88% 96%    Weight:   128.4 kg (283 lb)   Height:        Intake/Output Summary (Last 24 hours) at 02/24/2018 8099 Last data filed at  02/24/2018 3154 Gross per 24 hour  Intake 1711.71 ml  Output 2410 ml  Net -698.29 ml   Filed Weights   02/21/18 0445 02/23/18 0530 02/24/18 0600  Weight: (!) 137 kg (302 lb) 133.4 kg  (294 lb) 128.4 kg (283 lb)    Examination:   General: Not in pain or dyspnea, deconditioned Neurology: Awake and alert, non focal  E ENT: mild pallor, no icterus, oral mucosa moist Cardiovascular: No JVD. S1-S2 present, rhythmic, no gallops, rubs, or murmurs. No lower extremity edema. Pulmonary: decreased breath sounds bilaterally at bases, poor inspiratory effort, no wheezing, rhonchi or rales. Gastrointestinal. Abdomen protuberant, no organomegaly, non tender, no rebound or guarding Skin. No rashes Musculoskeletal: no joint deformities         Data Reviewed: I have personally reviewed following labs and imaging studies  CBC: Recent Labs  Lab 02/20/18 2137 02/21/18 0756 02/21/18 1540 02/22/18 0521 02/23/18 0410 02/24/18 0500  WBC 15.3* 9.6 9.3 9.7 8.2 7.2  NEUTROABS 13.0* 7.1  --   --  4.8 4.2  HGB 8.6* 6.2* 7.5* 8.3* 8.2* 8.2*  HCT 26.7* 18.8* 23.2* 25.4* 25.5* 25.9*  MCV 87.8 87.9 88.5 88.5 90.7 90.2  PLT 283 203 207 260 226 008   Basic Metabolic Panel: Recent Labs  Lab 02/21/18 0756 02/21/18 1540 02/21/18 2031 02/22/18 0521 02/23/18 0410 02/24/18 0500  NA 139 138 141 145 140 142  K 6.4* 5.3* 4.6 4.3 3.6 3.9  CL 118* 115* 116* 116* 112* 114*  CO2 14* 17* 19* 22 23 25   GLUCOSE 96 107* 117* 122* 110* 102*  BUN 86* 75* 68* 55* 32* 18  CREATININE 3.42* 2.90* 2.53* 2.10* 1.55* 1.26*  CALCIUM 8.2* 8.1* 8.5* 8.4* 8.2* 8.2*  MG  --   --   --   --   --  2.0  PHOS 5.6* 4.9*  --  4.2  --  2.1*   GFR: Estimated Creatinine Clearance: 69.5 mL/min (A) (by C-G formula based on SCr of 1.26 mg/dL (H)). Liver Function Tests: Recent Labs  Lab 02/21/18 0756  AST 19  ALT 9*  ALKPHOS 39  BILITOT 0.5  PROT 5.9*  ALBUMIN 1.9*  1.9*   No results for input(s): LIPASE, AMYLASE in the last 168 hours. No results for input(s): AMMONIA in the last 168 hours. Coagulation Profile: Recent Labs  Lab 02/21/18 0756  INR 1.46   Cardiac Enzymes: Recent Labs  Lab  02/21/18 0756 02/24/18 0500  CKTOTAL 712*  --   TROPONINI <0.03 <0.03   BNP (last 3 results) No results for input(s): PROBNP in the last 8760 hours. HbA1C: No results for input(s): HGBA1C in the last 72 hours. CBG: No results for input(s): GLUCAP in the last 168 hours. Lipid Profile: No results for input(s): CHOL, HDL, LDLCALC, TRIG, CHOLHDL, LDLDIRECT in the last 72 hours. Thyroid Function Tests: No results for input(s): TSH, T4TOTAL, FREET4, T3FREE, THYROIDAB in the last 72 hours. Anemia Panel: No results for input(s): VITAMINB12, FOLATE, FERRITIN, TIBC, IRON, RETICCTPCT in the last 72 hours.    Radiology Studies: I have reviewed all of the imaging during this hospital visit personally     Scheduled Meds: . Chlorhexidine Gluconate Cloth  6 each Topical Q0600  . loratadine  10 mg Oral Daily  . mupirocin ointment  1 application Nasal BID  . pantoprazole (PROTONIX) IV  40 mg Intravenous Q12H  . sodium chloride flush  3 mL Intravenous Q12H   Continuous Infusions: . lactated ringers 1,000 mL (02/23/18  1516)  . sodium chloride    . vancomycin       LOS: 3 days        Tawni Millers, MD [facility] Pager [phone]

## 2018-02-24 NOTE — Consult Note (Addendum)
[facility] Nurse wound follow up Wound type: Venous insufficiency to bilateral lower legs.  Orders on 02/21/18 for twice weekly Unna boots via ortho tech.  Call to ortho tech this AM and notified that changes would be twice weekly on Monday and Thursday.  Will clarify order to indicate specific days.  I proceeded to remove existing dressing and apply vaseline gauze and bedside RN to notify ortho tech when supplies have arrived to unit.  Patient had gotten out of bed and legs had begun to weep excessively this AM.   Will need 4 rolls of 4" COban.  Will not follow at this time.  Please re-consult if needed.  Domenic Moras RN BSN [place] Pager [phone]

## 2018-02-25 ENCOUNTER — Ambulatory Visit: Payer: Medicare Other | Admitting: Adult Health

## 2018-02-25 LAB — BPAM RBC
Blood Product Expiration Date: 201903312359
Blood Product Expiration Date: 201903312359
Blood Product Expiration Date: 201903312359
Blood Product Expiration Date: 201903312359
ISSUE DATE / TIME: 201903011008
ISSUE DATE / TIME: 201903011155
Unit Type and Rh: 5100
Unit Type and Rh: 5100
Unit Type and Rh: 5100
Unit Type and Rh: 5100

## 2018-02-25 LAB — TYPE AND SCREEN
ABO/RH(D): O POS
Antibody Screen: NEGATIVE
Unit division: 0
Unit division: 0
Unit division: 0
Unit division: 0

## 2018-02-25 LAB — BASIC METABOLIC PANEL
Anion gap: 7 (ref 5–15)
BUN: 11 mg/dL (ref 6–20)
CO2: 24 mmol/L (ref 22–32)
Calcium: 8.4 mg/dL — ABNORMAL LOW (ref 8.9–10.3)
Chloride: 111 mmol/L (ref 101–111)
Creatinine, Ser: 1.15 mg/dL — ABNORMAL HIGH (ref 0.44–1.00)
GFR calc Af Amer: >60 mL/min (ref >60)
GFR calc non Af Amer: 52 mL/min — ABNORMAL LOW (ref >60)
Glucose, Bld: 102 mg/dL — ABNORMAL HIGH (ref 65–99)
Potassium: 3.6 mmol/L (ref 3.5–5.1)
Sodium: 142 mmol/L (ref 135–145)

## 2018-02-25 MED ORDER — GUAIFENESIN-DM 100-10 MG/5ML PO SYRP
5.0000 mL | ORAL_SOLUTION | ORAL | Status: DC | PRN
  Administered 2018-02-25 – 2018-02-26 (×2): 5 mL via ORAL
  Filled 2018-02-25 (×2): qty 10

## 2018-02-25 MED ORDER — VANCOMYCIN HCL 10 G IV SOLR
1500.0000 mg | INTRAVENOUS | Status: DC
  Filled 2018-02-25: qty 1500

## 2018-02-25 MED ORDER — SODIUM CHLORIDE 0.9% FLUSH: 10.0000 mL | INTRAVENOUS | Status: DC | PRN

## 2018-02-25 NOTE — Plan of Care (Signed)
  Progressing Education: Knowledge of General Education information will improve 02/25/2018 1124 - Progressing by Jonathandavid Marlett, Royetta Crochet, RN Nutrition: Adequate nutrition will be maintained 02/25/2018 1124 - Progressing by Bellamarie Pflug, Royetta Crochet, RN Pain Managment: General experience of comfort will improve 02/25/2018 1124 - Progressing by Abbott Jasinski, Royetta Crochet, RN

## 2018-02-25 NOTE — Progress Notes (Addendum)
PROGRESS NOTE    Kari [name]  WUX:[id number] DOB: 1961/05/24 DOA: 02/20/2018 PCP: Rosita Fire, MD    Brief Narrative:  57 year old female who presented with weakness. She does have a significant past medical history for diastolic heart failure, hypertension, dyslipidemia, ischemic cardiomyopathy status post AICD and chronic venous stasis ulcers. Patient complainedof 48 hours of dizziness and confusion, on the day of admission she was found down on the floor,apparently after a mechanical fall. About 7 days ago she had a flulike illness. On the initial physical examination, she was afebrile, heart rate 94, respiratory rate26, blood pressure 86/32, 130/84, oxygen saturation 95% on room air. Dry mucous membranes, decreased breath sounds bilaterally with bibasilar rales, no wheezing, no rhonchi, heart S1-S2 present, rhythmic, no gallops, rubs or murmurs, abdomen soft, nontender, 2+ lower extremity pitting edema.Sodium 136, potassium greater than 7.5, chloride 113, bicarb 12, glucose 86, BUN 88, creatinine 3.85, white count 15.3, hemoglobin 8.6, hematocrit 36.7, platelets 283,urinalysis specific gravity 1.015, 6-30 white cells, negative proteins. Chest x-ray with hypoinflation, significant left rotation, cardiomegaly,withno infiltrates. EKG, sinus rhythm, 81 bpm, interventricular conduction delay, no peak T waves, no QTcprolongation.  Patient was admitted to the hospital with working diagnosis of severe hyperkalemia,complicated by acute kidney injury chronic kidney disease and hypotension   Assessment & Plan:   Principal Problem:   Hyperkalemia Active Problems:   Venous stasis ulcers (HCC)   Morbid obesity (HCC)   CHF (congestive heart failure) (HCC)   Hypokalemia   Anemia   Shock circulatory ([place])   Hypotension   Sepsis ([place])   Encounter for central line placement    1.AKI on CKD stage 3 complicated with hyperkalemia.Renal function continue to improve with serum  cr down to 1,15 with K at 3,6, serum bicarbonate 24. Will continue to avoid nephrotoxic medications. Will hold on IV fluids for now.   2. Lower extremities woundswith cellulitis/ present on admission/ septic shock requiring vasopressors on admission. Patint has responded well to IV vancomycin #3, likely will need MRSA coverage when transition to oral antibiotic therapy. Improved wound secretions, continue local wound care. Continue pain control. Will remove central line. Out of bed and physical therapy evaluation.   2. Acute symptomatic anemia on anemia of chronic disease.SPtransfusion of2 units PRBC. No further signs of bleeding, last hb stable at 8.2. Suspected subacute blood loss through open wounds. Will need close follow up.   4. Diastolic heart failure, EF 40 to 45%, no signs of exacerbation. Continue to hold on diuretic due to resolving aki, continue blood pressure monitoring. Continue to hold on lisinopril due to resolving shock and hyperkalemia.   5. Morbid obesity. Calculated BMI 44. Physical therapy evaluation before discharge.   DVT prophylaxis:scd Code Status:full Family Communication:I spoke with patient's family at the bedside and all questions were addressed.  Disposition Plan:plan to discharge home in am, pending physical therapy evaluation and no further hypotension.    Consultants:    Procedures:    Antimicrobials:      Subjective: Patient has been transferred to the medical ward, no nausea or vomiting, improved leg pain. Positive cough but no dyspnea.   Objective: Vitals:   02/24/18 1600 02/24/18 1700 02/24/18 2119 02/25/18 0441  BP: (!) 106/56 (!) 110/46 115/74 126/74  Pulse: 71 67 97 95  Resp:   18 18  Temp:   98.8 F (37.1 C) 98.7 F (37.1 C)  TempSrc:   Oral Oral  SpO2: 96% 100% 97% 95%  Weight:      Height:  Intake/Output Summary (Last 24 hours) at 02/25/2018 0900 Last data filed at 02/24/2018 1700 Gross per 24 hour    Intake 0 ml  Output 350 ml  Net -350 ml   Filed Weights   02/21/18 0445 02/23/18 0530 02/24/18 0600  Weight: (!) 137 kg (302 lb) 133.4 kg (294 lb) 128.4 kg (283 lb)    Examination:   General: Not in pain or dyspnea, deconditioend Neurology: Awake and alert, non focal  E ENT: no pallor, no icterus, oral mucosa moist Cardiovascular: No JVD. S1-S2 present, rhythmic, no gallops, rubs, or murmurs. No lower extremity edema. Pulmonary: decreased breath sounds bilaterally at bases, adequate air movement, no wheezing, rhonchi or rales. Gastrointestinal. Abdomen protuberant, no organomegaly, non tender, no rebound or guarding Skin. Dressing in place for lower extremities.  Musculoskeletal: no joint deformities     Data Reviewed: I have personally reviewed following labs and imaging studies  CBC: Recent Labs  Lab 02/20/18 2137 02/21/18 0756 02/21/18 1540 02/22/18 0521 02/23/18 0410 02/24/18 0500  WBC 15.3* 9.6 9.3 9.7 8.2 7.2  NEUTROABS 13.0* 7.1  --   --  4.8 4.2  HGB 8.6* 6.2* 7.5* 8.3* 8.2* 8.2*  HCT 26.7* 18.8* 23.2* 25.4* 25.5* 25.9*  MCV 87.8 87.9 88.5 88.5 90.7 90.2  PLT 283 203 207 260 226 993   Basic Metabolic Panel: Recent Labs  Lab 02/21/18 0756 02/21/18 1540 02/21/18 2031 02/22/18 0521 02/23/18 0410 02/24/18 0500 02/25/18 0816  NA 139 138 141 145 140 142 142  K 6.4* 5.3* 4.6 4.3 3.6 3.9 3.6  CL 118* 115* 116* 116* 112* 114* 111  CO2 14* 17* 19* 22 23 25 24   GLUCOSE 96 107* 117* 122* 110* 102* 102*  BUN 86* 75* 68* 55* 32* 18 11  CREATININE 3.42* 2.90* 2.53* 2.10* 1.55* 1.26* 1.15*  CALCIUM 8.2* 8.1* 8.5* 8.4* 8.2* 8.2* 8.4*  MG  --   --   --   --   --  2.0  --   PHOS 5.6* 4.9*  --  4.2  --  2.1*  --    GFR: Estimated Creatinine Clearance: 76.1 mL/min (A) (by C-G formula based on SCr of 1.15 mg/dL (H)). Liver Function Tests: Recent Labs  Lab 02/21/18 0756  AST 19  ALT 9*  ALKPHOS 39  BILITOT 0.5  PROT 5.9*  ALBUMIN 1.9*  1.9*   No results  for input(s): LIPASE, AMYLASE in the last 168 hours. No results for input(s): AMMONIA in the last 168 hours. Coagulation Profile: Recent Labs  Lab 02/21/18 0756  INR 1.46   Cardiac Enzymes: Recent Labs  Lab 02/21/18 0756 02/24/18 0500  CKTOTAL 712*  --   TROPONINI <0.03 <0.03   BNP (last 3 results) No results for input(s): PROBNP in the last 8760 hours. HbA1C: No results for input(s): HGBA1C in the last 72 hours. CBG: No results for input(s): GLUCAP in the last 168 hours. Lipid Profile: No results for input(s): CHOL, HDL, LDLCALC, TRIG, CHOLHDL, LDLDIRECT in the last 72 hours. Thyroid Function Tests: No results for input(s): TSH, T4TOTAL, FREET4, T3FREE, THYROIDAB in the last 72 hours. Anemia Panel: No results for input(s): VITAMINB12, FOLATE, FERRITIN, TIBC, IRON, RETICCTPCT in the last 72 hours.    Radiology Studies: I have reviewed all of the imaging during this hospital visit personally     Scheduled Meds: . Chlorhexidine Gluconate Cloth  6 each Topical Q0600  . famotidine  20 mg Oral Daily  . loratadine  10 mg Oral Daily  .  mupirocin ointment  1 application Nasal BID  . sodium chloride flush  3 mL Intravenous Q12H   Continuous Infusions: . sodium chloride    . vancomycin Stopped (02/24/18 1125)     LOS: 4 days        Adlyn Fife Gerome Apley, MD [facility] Pager [phone]

## 2018-02-25 NOTE — Progress Notes (Deleted)
Cardiology Office Note   Date:  02/25/2018   ID:  Kari [name], DOB [birth year], MRN [id number]  PCP:  Rosita Fire, MD  Cardiologist:   No chief complaint on file.    History of Present Illness: Kari [name] is a 57 y.o. female who presents for ***    Past Medical History:  Diagnosis Date  . Asthma   . CHF (congestive heart failure) ([facility]) 03/12/2014   a. EF 40-45% by echo in 06/2017 with normal cors by cath. ICD placed following VT arrest  . Depression   . Headache(784.0)   . Hyperlipidemia   . Hypertension   . Lung nodules   . Renal disorder   . Sarcoidosis   . Ulcer    left posterior calf    Past Surgical History:  Procedure Laterality Date  . cataract surgery Left 07-2013  . ICD IMPLANT N/A 07/05/2017   Procedure: ICD Implant;  Surgeon: Constance Haw, MD;  Location: [address] [facility];  Service: Cardiovascular;  Laterality: N/A;  . LEFT HEART CATH AND CORONARY ANGIOGRAPHY N/A 07/03/2017   Procedure: Left Heart Cath and Coronary Angiography;  Surgeon: Belva Crome, MD;  Location: [facility];  Service: Cardiovascular;  Laterality: N/A;  . NO PAST SURGERIES       No current facility-administered medications for this visit.    No current outpatient medications on file.   Facility-Administered Medications Ordered in Other Visits  Medication Dose Route Frequency Provider Last Rate Last Dose  . acetaminophen (TYLENOL) tablet 650 mg  650 mg Oral Q6H PRN Fuller Plan A, MD   650 mg at 02/24/18 2025  . Chlorhexidine Gluconate Cloth 2 % PADS 6 each  6 each Topical Q0600 Arrien, Jimmy Picket, MD   6 each at 02/25/18 [phone]  . collagenase (SANTYL) ointment 1 application  1 application Topical PRN Smith, Rondell A, MD      . famotidine (PEPCID) tablet 20 mg  20 mg Oral Daily Arrien, Jimmy Picket, MD      . ipratropium-albuterol (DUONEB) 0.5-2.5 (3) MG/3ML nebulizer solution 3 mL  3 mL Nebulization Q4H PRN Smith, Rondell A, MD      . loratadine  (CLARITIN) tablet 10 mg  10 mg Oral Daily Tamala Julian, Rondell A, MD   10 mg at 02/24/18 1025  . mupirocin ointment (BACTROBAN) 2 % 1 application  1 application Nasal BID Arrien, Jimmy Picket, MD   1 application at 32/20/25 2021  . ondansetron (ZOFRAN) tablet 4 mg  4 mg Oral Q6H PRN Fuller Plan A, MD       Or  . ondansetron (ZOFRAN) injection 4 mg  4 mg Intravenous Q6H PRN Fuller Plan A, MD   4 mg at 02/21/18 2142  . sodium chloride 0.9 % bolus 250 mL  250 mL Intravenous Once Smith, Rondell A, MD      . sodium chloride flush (NS) 0.9 % injection 10-40 mL  10-40 mL Intracatheter PRN Arrien, Jimmy Picket, MD      . sodium chloride flush (NS) 0.9 % injection 3 mL  3 mL Intravenous Q12H Smith, Rondell A, MD   3 mL at 02/24/18 2018  . vancomycin (VANCOCIN) IVPB 1000 mg/200 mL premix  1,000 mg Intravenous Q24H Adrian Saran, [facility]   Stopped at 02/24/18 1125    Allergies:   Patient has no known allergies.    Social History:  The patient  reports that  has never smoked. she has never used smokeless tobacco.  She reports that she does not drink alcohol or use drugs.   Family History:  The patient's family history includes Cancer in her mother; Diabetes Mellitus II in her father and mother; Heart disease in her father; Heart failure in her mother; Hypertension in her mother; Stroke in her father.    ROS: All other systems are reviewed and negative. Unless otherwise mentioned in H&P    PHYSICAL EXAM: VS:  LMP 11/21/2011  , BMI There is no height or weight on file to calculate BMI. GEN: Well nourished, well developed, in no acute distress HEENT: normal Neck: no JVD, carotid bruits, or masses Cardiac: ***RRR; no murmurs, rubs, or gallops,no edema  Respiratory:  clear to auscultation bilaterally, normal work of breathing GI: soft, nontender, nondistended, + BS MS: no deformity or atrophy Skin: warm and dry, no rash Neuro:  Strength and sensation are intact Psych: euthymic mood, full  affect   EKG:  EKG {ACTION; IS/IS RVU:[id number]} ordered today. The ekg ordered today demonstrates ***   Recent Labs: 06/26/2017: TSH 9.201 11/18/2017: B Natriuretic Peptide 81.0 02/21/2018: ALT 9 02/24/2018: BUN 18; Creatinine, Ser 1.26; Hemoglobin 8.2; Magnesium 2.0; Platelets 199; Potassium 3.9; Sodium 142    Lipid Panel    Component Value Date/Time   CHOL 135 06/21/2012 0950   TRIG 59 06/21/2012 0950   HDL 54 06/21/2012 0950   CHOLHDL 2.5 06/21/2012 0950   VLDL 12 06/21/2012 0950   LDLCALC 69 06/21/2012 0950      Wt Readings from Last 3 Encounters:  02/24/18 283 lb (128.4 kg)  12/26/17 (!) 306 lb (138.8 kg)  12/18/17 300 lb (136.1 kg)      Other studies Reviewed: Additional studies/ records that were reviewed today include: ***. Review of the above records demonstrates: ***   ASSESSMENT AND PLAN:  1.  ***   Current medicines are reviewed at length with the patient today.    Labs/ tests ordered today include: *** Phill Myron. [place] Pugh, ANP, AACC   02/25/2018 7:31 AM    [facility] [address], [place], [place] [address] Phone: [phone]; Fax: [phone]

## 2018-02-25 NOTE — Consult Note (Signed)
   [facility] CM Inpatient Consult   02/25/2018  MARYJO RAGON 1961/07/30 [id number]    [facility] Care Management follow up.   Spoke with Ms. Baria at bedside. She reports she is likely discharging home tomorrow. Ms. Zeiter remains agreeable to ongoing [place] [facility] services. She is active with [facility] RNCM and [facility] LCSW.  Ms. Ingle reports she will need a walker at home and will resume home health with [facility]. Made Ms. Boeke aware that there may be an out of pocket expense with a new walker. States her last walker was left in [place].   Spoke with inpatient RNCM to discuss all of the above notes and to make aware [facility] is active.    Will update [facility] Community team.    Marthenia Rolling, [place], RN,BSN [facility] Liaison [phone]

## 2018-02-25 NOTE — Progress Notes (Signed)
Pharmacy Antibiotic Note  Kari [name] is a 57 y.o. female admitted on 02/20/2018 with sepsis and cellulitis.  Pharmacy has been consulted for vancomycin dosing.  Plan: Day 5 Antibiotics 1) Due to continued improvement in SCr, will change vanc to 1500mg  IV q24  - goal AUC still 400-500 2) Will monitor renal function, clinical course, cultures  Height: 5\' 7"  (170.2 cm) Weight: 283 lb (128.4 kg) IBW/kg (Calculated) : 61.6  Temp (24hrs), Avg:98.8 F (37.1 C), Min:98.7 F (37.1 C), Max:98.8 F (37.1 C)  Recent Labs  Lab 02/20/18 2313 02/21/18 0115 02/21/18 0756 02/21/18 1540 02/21/18 2031 02/22/18 0521 02/23/18 0410 02/24/18 0500 02/25/18 0816  WBC  --   --  9.6 9.3  --  9.7 8.2 7.2  --   CREATININE  --   --  3.42* 2.90* 2.53* 2.10* 1.55* 1.26* 1.15*  LATICACIDVEN 0.71 0.69  --  0.7  --   --   --   --   --     Estimated Creatinine Clearance: 76.1 mL/min (A) (by C-G formula based on SCr of 1.15 mg/dL (H)).    No Known Allergies   Thank you for allowing pharmacy to be a part of this patient's care.   Dolly Rias RPh 02/25/2018, 11:11 AM Pager [phone]

## 2018-02-25 NOTE — Evaluation (Signed)
Physical Therapy Evaluation Patient Details Name: Kari [name] MRN: [id number] DOB: [birth year] Today's Date: 02/25/2018   History of Present Illness  57 year old female patient with a complicated medical history which includes hypertension, systolic heart failure with a EF 40-45%, prior VT arrest now has ICD, tonic venous stasis ulcers, and obesity.  Admitted with acute on chronic renal failure and hyperkalemia.  Clinical Impression  Patient presents with decreased mobility due to general weakness due to hospitalization and bedrest.  Currently min to mod A with mobility, but has sister and aide assist so feel she can go home with assist with HHPT and equipment as noted below.  PT to follow acutely.    Follow Up Recommendations Home health PT;Supervision/Assistance - 24 hour    Equipment Recommendations  Rolling walker with 5" wheels;3in1 (PT)(bariatric size walker and 3:1 please)    Recommendations for Other Services       Precautions / Restrictions Precautions Precautions: Fall      Mobility  Bed Mobility Overal bed mobility: Needs Assistance Bed Mobility: Supine to Sit     Supine to sit: Mod assist     General bed mobility comments: lifting assist for trunk, scooting for hips  Transfers Overall transfer level: Needs assistance Equipment used: Rolling walker (2 wheeled) Transfers: Sit to/from Stand Sit to Stand: Min assist         General transfer comment: up from elevated surface of air bed  Ambulation/Gait Ambulation/Gait assistance: Min guard;Min assist Ambulation Distance (Feet): 70 Feet Assistive device: Rolling walker (2 wheeled) Gait Pattern/deviations: Step-to pattern;Step-through pattern;Decreased stride length;Wide base of support     General Gait Details: increased time, slower shorter steps due to not up since admission, reports "feet feel weird"  Stairs            Wheelchair Mobility    Modified Rankin (Stroke Patients Only)        Balance Overall balance assessment: Needs assistance   Sitting balance-Leahy Scale: Fair     Standing balance support: Bilateral upper extremity supported Standing balance-Leahy Scale: Poor Standing balance comment: UE support for balance in standing                             Pertinent Vitals/Pain Pain Assessment: 0-10 Pain Score: 8  Pain Location: legs Pain Descriptors / Indicators: Aching Pain Intervention(s): Monitored during session;Repositioned    Home Living Family/patient expects to be discharged to:: Private residence Living Arrangements: Alone Available Help at Discharge: Family;Available PRN/intermittently Type of Home: House Home Access: Stairs to enter Entrance Stairs-Rails: Right Entrance Stairs-Number of Steps: 2 Home Layout: One level Home Equipment: None;Tub bench;Grab bars - tub/shower      Prior Function Level of Independence: Needs assistance   Gait / Transfers Assistance Needed: ambulates household distances  ADL's / Homemaking Assistance Needed: independent with ADLs and sister helps with iADLs  Comments: sister comes every day, aide 7 days 2-3 hours, [facility] for wound care 1x/week     Hand Dominance   Dominant Hand: Right    Extremity/Trunk Assessment   Upper Extremity Assessment Upper Extremity Assessment: Overall WFL for tasks assessed    Lower Extremity Assessment Lower Extremity Assessment: RLE deficits/detail;LLE deficits/detail RLE Deficits / Details: both legs with unna boots, AROM grossly WFL, strength hip flexion 3/5, knee extension 4/5; R knee with edema compared to L LLE Deficits / Details: both legs with unna boots, AROM grossly WFL, strength hip flexion 3/5, knee extension 4/5  Communication   Communication: No difficulties  Cognition Arousal/Alertness: Awake/alert Behavior During Therapy: WFL for tasks assessed/performed Overall Cognitive Status: Within Functional Limits for tasks assessed                                         General Comments      Exercises     Assessment/Plan    PT Assessment Patient needs continued PT services  PT Problem List Decreased strength;Decreased mobility;Decreased knowledge of use of DME;Decreased balance;Decreased activity tolerance;Decreased knowledge of precautions       PT Treatment Interventions DME instruction;Therapeutic activities;Therapeutic exercise;Patient/family education;Gait training;Balance training;Stair training;Functional mobility training    PT Goals (Current goals can be found in the Care Plan section)  Acute Rehab PT Goals Patient Stated Goal: To go home PT Goal Formulation: With patient Time For Goal Achievement: 03/11/18 Potential to Achieve Goals: Good    Frequency Min 3X/week   Barriers to discharge        Co-evaluation               AM-PAC PT "6 Clicks" Daily Activity  Outcome Measure Difficulty turning over in bed (including adjusting bedclothes, sheets and blankets)?: Unable Difficulty moving from lying on back to sitting on the side of the bed? : Unable Difficulty sitting down on and standing up from a chair with arms (e.g., wheelchair, bedside commode, etc,.)?: Unable Help needed moving to and from a bed to chair (including a wheelchair)?: A Little Help needed walking in hospital room?: A Little Help needed climbing 3-5 steps with a railing? : A Lot 6 Click Score: 11    End of Session Equipment Utilized During Treatment: Gait belt Activity Tolerance: Patient limited by fatigue Patient left: in chair;with call bell/phone within reach   PT Visit Diagnosis: Muscle weakness (generalized) (M62.81);Other abnormalities of gait and mobility (R26.89)    Time: 2957-4734 PT Time Calculation (min) (ACUTE ONLY): 18 min   Charges:   PT Evaluation $PT Eval Moderate Complexity: 1 Mod     PT G CodesMagda Kiel, [place] [phone] 02/25/2018   [name] 02/25/2018, 5:26 PM

## 2018-02-25 NOTE — Care Management Note (Signed)
Case Management Note  Patient Details  Name: Kari [name] MRN: [id number] Date of Birth: December 12, 1961  Subjective/Objective:   Admitted with Hyperkalemia                 Action/Plan: pt plan to discharge home with [place]   Expected Discharge Date:  (unknown)               Expected Discharge Plan:  [place]  In-House Referral:     Discharge planning Services  CM Consult  Post Acute Care Choice:    Choice offered to:     DME Arranged:  Walker rolling DME Agency:  [place] Arranged:  RN, PT, NA, Social Work [employer] Agency:  [facility]  Status of Service:  Completed, signed off  If discussed at [employer], dates discussed:    Additional CommentsPurcell Mouton, RN 02/25/2018, 3:05 PM

## 2018-02-25 NOTE — Care Management Important Message (Addendum)
Important Message  Patient Details IM Letter given to Cookie/Case Manager to present to the Patient Name: Kari [name] MRN: [id number] Date of Birth: November 26, 1961   Medicare Important Message Given:  Yes    Kerin Salen 02/25/2018, 11:47 AMImportant Message  Patient Details  Name: Kari [name] MRN: [id number] Date of Birth: [birth year]   Medicare Important Message Given:  Yes    Kerin Salen 02/25/2018, 11:47 AM

## 2018-02-26 ENCOUNTER — Other Ambulatory Visit: Payer: Self-pay | Admitting: *Deleted

## 2018-02-26 LAB — BASIC METABOLIC PANEL
Anion gap: 5 (ref 5–15)
BUN: 9 mg/dL (ref 6–20)
CO2: 24 mmol/L (ref 22–32)
Calcium: 8.1 mg/dL — ABNORMAL LOW (ref 8.9–10.3)
Chloride: 109 mmol/L (ref 101–111)
Creatinine, Ser: 1.18 mg/dL — ABNORMAL HIGH (ref 0.44–1.00)
GFR calc Af Amer: 59 mL/min — ABNORMAL LOW (ref >60)
GFR calc non Af Amer: 51 mL/min — ABNORMAL LOW (ref >60)
Glucose, Bld: 101 mg/dL — ABNORMAL HIGH (ref 65–99)
Potassium: 3.3 mmol/L — ABNORMAL LOW (ref 3.5–5.1)
Sodium: 138 mmol/L (ref 135–145)

## 2018-02-26 LAB — CULTURE, BLOOD (ROUTINE X 2)
Culture: NO GROWTH
Special Requests: ADEQUATE

## 2018-02-26 MED ORDER — DOXYCYCLINE HYCLATE 100 MG PO TABS
100.0000 mg | ORAL_TABLET | Freq: Two times a day (BID) | ORAL | Status: DC
  Administered 2018-02-26: 100 mg via ORAL
  Filled 2018-02-26: qty 1

## 2018-02-26 MED ORDER — POTASSIUM CHLORIDE CRYS ER 20 MEQ PO TBCR
40.0000 meq | EXTENDED_RELEASE_TABLET | Freq: Once | ORAL | Status: AC
  Administered 2018-02-26: 40 meq via ORAL
  Filled 2018-02-26: qty 2

## 2018-02-26 MED ORDER — FAMOTIDINE 20 MG PO TABS: 20.0000 mg | ORAL_TABLET | Freq: Every day | ORAL | 0 refills | Status: DC

## 2018-02-26 MED ORDER — DOXYCYCLINE HYCLATE 100 MG PO TABS: 100.0000 mg | ORAL_TABLET | Freq: Two times a day (BID) | ORAL | 0 refills | Status: AC

## 2018-02-26 MED ORDER — POTASSIUM CHLORIDE CRYS ER 20 MEQ PO TBCR: 20.0000 meq | EXTENDED_RELEASE_TABLET | Freq: Every day | ORAL | 0 refills | Status: DC

## 2018-02-26 NOTE — Progress Notes (Signed)
Pt will not be able to have DME related to pt receiving RW and BSC in 2018. Insurance will not cover. Pt is aware of this and that she may pay out of pocket.

## 2018-02-26 NOTE — Discharge Summary (Signed)
Physician Discharge Summary  Kari [name] ATF:[id number] DOB: 26-Jan-1961 DOA: 02/20/2018  PCP: Rosita Fire, MD  Admit date: 02/20/2018 Discharge date: 02/26/2018  Admitted From: Home Disposition: [place]: Yes  Discharge Condition: Stable CODE STATUS: Full Diet recommendation: Heart Healthy  Brief/Interim Summary: 57 year old female who presented with weakness. She does have a significant past medical history for diastolic heart failure, hypertension, dyslipidemia, ischemic cardiomyopathy status post AICD and chronic venous stasis ulcers. Patient complainedof 48 hours of dizziness and confusion, on the day of admission she was found down on the floor,apparently after a mechanical fall. On the initial physical examination, she was afebrile, heart rate 94, respiratory rate26, blood pressure 86/32, 130/84, oxygen saturation 95% on room air. Dry mucous membranes, decreased breath sounds bilaterally with bibasilar rales, no wheezing, no rhonchi, heart S1-S2 present, rhythmic, no gallops, rubs or murmurs, abdomen soft, nontender, 2+ lower extremity pitting edema.Sodium 136, potassium greater than 7.5, chloride 113, bicarb 12, glucose 86, BUN 88, creatinine 3.85, white count 15.3, hemoglobin 8.6, hematocrit 36.7, platelets 283,urinalysis specific gravity 1.015, 6-30 white cells, negative proteins. Chest x-ray with hypoinflation, significant left rotation, cardiomegaly,withno infiltrates. EKG, sinus rhythm, 81 bpm, interventricular conduction delay, no peak T waves, no QTcprolongation. Patient was admitted to the hospital with working diagnosis of severe hyperkalemia,complicated by acute kidney injury chronic kidney disease and hypotension.  She was also found to have worsening of her chronic venous stasis wounds,likely infected.She was started on antibiotics.  Following problems were addressed during her hospitalization:   1.AKI on CKD stage 3 complicated with  hyperkalemia.Renal function continue to improve.  Currently kidney function is on baseline.  Hyperkalemia has resolved .We will recommend to check kidney function in a week by doing a BMP test. Patient was found to be hyperkalemic on presentation.  She was taking 40 mEq of potassium a day.  Will recommend to take only 20 mEq of potassium every day . Check potassium level in a week.   2. Lower extremities woundswith cellulitis/ present on admission/ septic shock requiring vasopressors on admission. Patint has responded well to IV vancomycin.She will need MRSA coverage when transition to oral antibiotic therapy. Improved wound secretions, continue local wound care. We will switch the antibiotics to doxycycline on discharge which he will continue for 7 more days.  Physical therapy recommended home health.  We will also arrange wound care at home.  Patient has been recommended to follow-up at wound care clinic also.  2. Acute symptomatic anemia on anemia of chronic disease.SPtransfusion of2 units PRBC.No further signs of bleeding, last hb stable . Suspected subacute blood loss through open wounds. Will need close follow up as an outpatient.   4. Diastolic heart failure, EF 40 to 45%, no signs of exacerbation.   Will resume her home meds on discharge.  Currently CHF is compensated.  5. Morbid obesity. Calculated BMI 44.  Recommended the importance of healty diet and exercise    Discharge Diagnoses:  Principal Problem:   Hyperkalemia Active Problems:   Venous stasis ulcers (HCC)   Morbid obesity (HCC)   CHF (congestive heart failure) (HCC)   Hypokalemia   Anemia   Shock circulatory (HCC)   Hypotension   Sepsis ([place])   Encounter for central line placement    Discharge Instructions  Discharge Instructions    Diet - low sodium heart healthy   Complete by:  As directed    Discharge instructions   Complete by:  As directed    1) Please follow up  with your PCP in a week. 2)Please do  a BMP test in a week to check your kidney function. 3) Follow up with Home Health. Follow up at [facility]. 4) Take prescribed medications as instructed. 5) Continue taking Potassium 20 meq daily instead of 40 mEq.Check potassium level in a week.   Increase activity slowly   Complete by:  As directed      Allergies as of 02/26/2018   No Known Allergies     Medication List    TAKE these medications   acetaminophen 650 MG CR tablet Commonly known as:  TYLENOL 8 HOUR Take 1 tablet (650 mg total) by mouth 2 (two) times daily.   albuterol 108 (90 Base) MCG/ACT inhaler Commonly known as:  PROVENTIL HFA;VENTOLIN HFA Inhale 2 puffs into the lungs every 6 (six) hours as needed for shortness of breath.   aspirin EC 81 MG tablet Take 81 mg by mouth daily.   carvedilol 12.5 MG tablet Commonly known as:  COREG Take 1 tablet (12.5 mg total) by mouth 2 (two) times daily with a meal.   collagenase ointment Commonly known as:  SANTYL Apply 1 application topically as needed (for wound care). Apply to wound BLE per Tx   doxycycline 100 MG tablet Commonly known as:  VIBRA-TABS Take 1 tablet (100 mg total) by mouth every 12 (twelve) hours for 7 days.   famotidine 20 MG tablet Commonly known as:  PEPCID Take 1 tablet (20 mg total) by mouth daily. Start taking on:  02/27/2018   lisinopril 2.5 MG tablet Commonly known as:  PRINIVIL,ZESTRIL Take 1 tablet (2.5 mg total) by mouth daily.   loratadine 10 MG tablet Commonly known as:  CLARITIN Take 10 mg by mouth daily.   ondansetron 4 MG tablet Commonly known as:  ZOFRAN Take 1 tablet (4 mg total) by mouth every 6 (six) hours as needed for nausea.   potassium chloride SA 20 MEQ tablet Commonly known as:  KLOR-CON M20 Take 1 tablet (20 mEq total) by mouth daily for 14 days. What changed:  how much to take   torsemide 20 MG tablet Commonly known as:  DEMADEX Take 1 tablet (20 mg total) by mouth daily.      Follow-up Information     Rosita Fire, MD. Schedule an appointment as soon as possible for a visit in 1 week(s).   Specialty:  Internal Medicine Contact information: [place] [address] [phone]          No Known Allergies  Consultations: None  Procedures/Studies: Dg Chest 2 View  Result Date: 02/20/2018 CLINICAL DATA:  Shortness of Breath EXAM: CHEST  2 VIEW COMPARISON:  07/19/2017 FINDINGS: Left AICD. Cardiomegaly with vascular congestion. Bibasilar atelectasis. No effusions. No acute bony abnormality. IMPRESSION: Cardiomegaly with vascular congestion.  Bibasilar atelectasis. Electronically Signed   By: Rolm Baptise M.D.   On: 02/20/2018 22:56   Dg Tibia/fibula Left  Result Date: 02/20/2018 CLINICAL DATA:  Ulcers to both legs EXAM: LEFT TIBIA AND FIBULA - 2 VIEW COMPARISON:  08/22/2015 FINDINGS: No acute bony abnormality. No fracture, subluxation or dislocation. There are areas of wavy periosteal thickening within the left fibula, likely related to chronic venous stasis. No radiographic changes of osteomyelitis. Soft tissues are intact. IMPRESSION: No acute abnormality. Electronically Signed   By: Rolm Baptise M.D.   On: 02/20/2018 23:01   Dg Tibia/fibula Right  Result Date: 02/20/2018 CLINICAL DATA:  Bilateral lower leg ulcers. EXAM: RIGHT TIBIA AND  FIBULA - 2 VIEW COMPARISON:  None. FINDINGS: Wavy periosteal thickening in the tibia and fibula, likely related to chronic venous stasis disease. No acute bony abnormality. Specifically, no fracture, subluxation, or dislocation. No radiographic changes of osteomyelitis. IMPRESSION: No acute bony abnormality. Electronically Signed   By: Rolm Baptise M.D.   On: 02/20/2018 23:02   US Renal  Result Date: 02/21/2018 CLINICAL DATA:  Acute kidney injury, hypertension EXAM: RENAL / URINARY TRACT ULTRASOUND COMPLETE COMPARISON:  CT 04/01/2017 FINDINGS: Right Kidney: Length: 11.5 cm. Renal parenchyma is isoechoic compared to adjacent  liver. No mass. Mild hydronephrosis. Left Kidney: Length: 12.2 cm. Echogenicity within normal limits. No mass or hydronephrosis visualized. Bladder: Distended.  Large postvoid residual, estimated 4.2 L. IMPRESSION: 1. Mild right hydronephrosis. 2. Distended urinary bladder with large postvoid residual. Electronically Signed   By: Lucrezia Europe M.D.   On: 02/21/2018 10:55   Dg Chest Port 1 View  Result Date: 02/21/2018 CLINICAL DATA:  Central line placement. EXAM: PORTABLE CHEST 1 VIEW COMPARISON:  02/20/2018 and prior exam FINDINGS: A RIGHT IJ central venous catheter is noted with tip overlying the SUPERIOR cavoatrial junction. Cardiomegaly and LEFT-sided pacemaker again noted. Pulmonary vascular congestion and perihilar opacities are unchanged. No pneumothorax or definite pleural effusion. IMPRESSION: RIGHT IJ central venous catheter with tip overlying the SUPERIOR cavoatrial junction. There is no evidence of pneumothorax. Pulmonary vascular congestion and perihilar opacities again noted. Electronically Signed   By: Margarette Canada M.D.   On: 02/21/2018 12:26    None    Subjective: Patient seen and examined the bedside this morning.  Remains comfortable.  Was sitting on the chair.  Bilateral lower extremity wounds look improved .Patient is hemodynamically stable.  Labs are acceptable.  She is stable for discharge to home.  Discharge Exam: Vitals:   02/25/18 2230 02/26/18 0425  BP: 124/61 113/65  Pulse:  71  Resp: 20 20  Temp: 100 F (37.8 C) 99.3 F (37.4 C)  SpO2: 96% 96%   Vitals:   02/25/18 0441 02/25/18 1500 02/25/18 2230 02/26/18 0425  BP: 126/74 120/70 124/61 113/65  Pulse: 95 90  71  Resp: 18 18 20 20   Temp: 98.7 F (37.1 C) 98.7 F (37.1 C) 100 F (37.8 C) 99.3 F (37.4 C)  TempSrc: Oral Oral Oral Oral  SpO2: 95% 98% 96% 96%  Weight:      Height:        General: Pt is alert, awake, not in acute distress,morbidly obese Cardiovascular: RRR, S1/S2 +, no rubs, no  gallops Respiratory: CTA bilaterally, no wheezing, no rhonchi Abdominal: Soft, NT, ND, bowel sounds + Extremities: no edema, no cyanosis, bilateral chronic venous stasis ulcers wrapped with dressings    The results of significant diagnostics from this hospitalization (including imaging, microbiology, ancillary and laboratory) are listed below for reference.     Microbiology: Recent Results (from the past 240 hour(s))  MRSA PCR Screening     Status: Abnormal   Collection Time: 02/21/18  5:33 AM  Result Value Ref Range Status   MRSA by PCR POSITIVE (A) NEGATIVE Final    Comment:        The GeneXpert MRSA Assay (FDA approved for NASAL specimens only), is one component of a comprehensive MRSA colonization surveillance program. It is not intended to diagnose MRSA infection nor to guide or monitor treatment for MRSA infections. RESULT CALLED TO, READ BACK BY AND VERIFIED WITH: KOONTZ,A @ [id number] ON [id number] BY POTEAT,S Performed at [facility],  [place] [address] [place], [place] [address]   Culture, blood (Routine X 2) w Reflex to ID Panel     Status: None (Preliminary result)   Collection Time: 02/21/18 11:40 AM  Result Value Ref Range Status   Specimen Description   Final    BLOOD SITE NOT SPECIFIED Performed at [facility], [place] [address] [place], [place] [address]    Special Requests   Final    BOTTLES DRAWN AEROBIC AND ANAEROBIC Blood Culture adequate volume Performed at [place] [address] [place], [place] [address]    Culture   Final    NO GROWTH 4 DAYS Performed at [facility], [place] [address], [place], [place] [address]    Report Status PENDING  Incomplete     Labs: BNP (last 3 results) Recent Labs    07/19/17 2345 10/30/17 1502 11/18/17 1134  BNP 46.0 98.0 80.3   Basic Metabolic Panel: Recent Labs  Lab 02/21/18 0756 02/21/18 1540  02/22/18 0521 02/23/18 0410 02/24/18 0500 02/25/18 0816  02/26/18 0443  NA 139 138   < > 145 140 142 142 138  K 6.4* 5.3*   < > 4.3 3.6 3.9 3.6 3.3*  CL 118* 115*   < > 116* 112* 114* 111 109  CO2 14* 17*   < > 22 23 25 24 24   GLUCOSE 96 107*   < > 122* 110* 102* 102* 101*  BUN 86* 75*   < > 55* 32* 18 11 9   CREATININE 3.42* 2.90*   < > 2.10* 1.55* 1.26* 1.15* 1.18*  CALCIUM 8.2* 8.1*   < > 8.4* 8.2* 8.2* 8.4* 8.1*  MG  --   --   --   --   --  2.0  --   --   PHOS 5.6* 4.9*  --  4.2  --  2.1*  --   --    < > = values in this interval not displayed.   Liver Function Tests: Recent Labs  Lab 02/21/18 0756  AST 19  ALT 9*  ALKPHOS 39  BILITOT 0.5  PROT 5.9*  ALBUMIN 1.9*  1.9*   No results for input(s): LIPASE, AMYLASE in the last 168 hours. No results for input(s): AMMONIA in the last 168 hours. CBC: Recent Labs  Lab 02/20/18 2137 02/21/18 0756 02/21/18 1540 02/22/18 0521 02/23/18 0410 02/24/18 0500  WBC 15.3* 9.6 9.3 9.7 8.2 7.2  NEUTROABS 13.0* 7.1  --   --  4.8 4.2  HGB 8.6* 6.2* 7.5* 8.3* 8.2* 8.2*  HCT 26.7* 18.8* 23.2* 25.4* 25.5* 25.9*  MCV 87.8 87.9 88.5 88.5 90.7 90.2  PLT 283 203 207 260 226 199   Cardiac Enzymes: Recent Labs  Lab 02/21/18 0756 02/24/18 0500  CKTOTAL 712*  --   TROPONINI <0.03 <0.03   BNP: Invalid input(s): POCBNP CBG: No results for input(s): GLUCAP in the last 168 hours. D-Dimer No results for input(s): DDIMER in the last 72 hours. Hgb A1c No results for input(s): HGBA1C in the last 72 hours. Lipid Profile No results for input(s): CHOL, HDL, LDLCALC, TRIG, CHOLHDL, LDLDIRECT in the last 72 hours. Thyroid function studies No results for input(s): TSH, T4TOTAL, T3FREE, THYROIDAB in the last 72 hours.  Invalid input(s): FREET3 Anemia work up No results for input(s): VITAMINB12, FOLATE, FERRITIN, TIBC, IRON, RETICCTPCT in the last 72 hours. Urinalysis    Component Value Date/Time   COLORURINE YELLOW 02/20/2018 2357   APPEARANCEUR HAZY (A)  02/20/2018 2357   LABSPEC 1.015  02/20/2018 2357   PHURINE 5.0 02/20/2018 2357   GLUCOSEU NEGATIVE 02/20/2018 2357   HGBUR SMALL (A) 02/20/2018 2357   BILIRUBINUR NEGATIVE 02/20/2018 2357   KETONESUR NEGATIVE 02/20/2018 2357   PROTEINUR NEGATIVE 02/20/2018 2357   UROBILINOGEN 0.2 04/10/2014 0949   NITRITE NEGATIVE 02/20/2018 2357   LEUKOCYTESUR LARGE (A) 02/20/2018 2357   Sepsis Labs Invalid input(s): PROCALCITONIN,  WBC,  LACTICIDVEN Microbiology Recent Results (from the past 240 hour(s))  MRSA PCR Screening     Status: Abnormal   Collection Time: 02/21/18  5:33 AM  Result Value Ref Range Status   MRSA by PCR POSITIVE (A) NEGATIVE Final    Comment:        The GeneXpert MRSA Assay (FDA approved for NASAL specimens only), is one component of a comprehensive MRSA colonization surveillance program. It is not intended to diagnose MRSA infection nor to guide or monitor treatment for MRSA infections. RESULT CALLED TO, READ BACK BY AND VERIFIED WITH: KOONTZ,A @ 6503 TW [id number] BY POTEAT,S Performed at [facility] [address], [place], [place] [address]   Culture, blood (Routine X 2) w Reflex to ID Panel     Status: None (Preliminary result)   Collection Time: 02/21/18 11:40 AM  Result Value Ref Range Status   Specimen Description   Final    BLOOD SITE NOT SPECIFIED Performed at [facility], [place] [address] [place], [place] [address]    Special Requests   Final    BOTTLES DRAWN AEROBIC AND ANAEROBIC Blood Culture adequate volume Performed at [place] [address], [place], [place] [address]    Culture   Final    NO GROWTH 4 DAYS Performed at [facility], [place] [address], [place], [place] [address]    Report Status PENDING  Incomplete     Time coordinating discharge: Over 30 minutes  SIGNED:   Marene Lenz, MD  [facility] 02/26/2018, 11:47 AM Pager [phone]  If 7PM-7AM, please contact  night-coverage [email or web address] Password [email or web address]

## 2018-02-26 NOTE — Progress Notes (Signed)
[facility] is unable to take pt back. Pt agreed with [facility]. Offered to make an appointment at [facility]. Pt states that she did not like the [facility] and did not want to go back there.

## 2018-02-26 NOTE — Patient Outreach (Signed)
[facility]) Care Management  02/26/2018  Kari [name] 1961/03/21 [id number]  I spoke with Kari [name] today while she was still at the hospital but after she had been notified she would be discharged today. She was able to reiterate to me the discharge instructions that had been given to her by her nurse to include plans for home health services for dressing changes to her legs which will be provided by [facility], changes in her medication regimen to include decreasing her Potassium replacement dose from 51meq daily to 40meq daily and taking oral antibiotics, and the need to see her primary care provider within the next week. Kari [name] had a ride home and her pharmacy delivers.   I reached out to [facility] @ [facility]; [phone]) to request a post hospital follow up appointment but was unable to reach anyone or leave a message.   Plan: I will follow up with Kari [name] by phone tomorrow to review her medications and follow up on home health outreach and her post hospital follow up appointment.    [facility]  [phone]

## 2018-02-27 ENCOUNTER — Telehealth: Payer: Self-pay | Admitting: Podiatry

## 2018-02-27 ENCOUNTER — Other Ambulatory Visit: Payer: Self-pay | Admitting: *Deleted

## 2018-02-27 NOTE — Patient Outreach (Signed)
[name]) Care Management  02/27/2018  Kari [name] 12-20-1961 [id number]  [name] was discharged from the hospital yesterday after an admission from 02/20/18-02/26/18 for treatment of acute kidney injury on stage 3 chronic kidney disease with hyperkalemia, lower extremity cellulitis with septic shock, and acute asymptomatic anemia with transfusion of 2 units of packed red blood cells.   Post hospital follow up appointments - I spoke with [name] yesterday just prior to her discharge to home and reached out to:   Family Medicine @ [facility] ([facility]; [phone]) to request a post hospital follow up appointment which is scheduled for: Wednesday, 03/19/18 @ 9am (earliest available new patient appointment).  I have requested that [name] be placed on the waiting list so that she can be called if any cancellations present an earlier opening.   [name] (Cardiology; [phone]) to request a reschedule of her recently missed cardiology appointment (02/25/18): Tuesday, 03/18/18 @ 1:15pm.   [name] ([place]): 03/05/18 @ 3:30pm   Transportation Needs - I reviewed these appointments with [name] today so that she can plan ahead for transportation. She told me that she planned to take the bus to her appointment with [name]. I have concerns about her having to walk from the bus stop to the front door of the building given that she was just discharged from the hospital for treatment of bilateral lower extremity cellulitis. I spoke with [facility] nurse about the distance from the bus stop to her front door then spoke with [name] further about this. [name] says he sister goes to the podiatrist at this practice and is familiar with the bus stop distance and is going with her to the scheduled appointment. [name]'s sister states the bus will drop her right in front of the practice door. She does not feel she needs  additional transportation assistance.     Home Health Follow up - [name] was referred to [facility] for home nursing visits to follow up on dressing changes and for HHPT. When I spoke with [name] today, she said she received a call from the agency and has a scheduled appointment with the home health nurse on Saturday and with the physical therapist next week.   Plan: I will follow up with [name] by phone next week to ensure that her home health visits are active and to ensure that she has all other needed transition of care interventions.    [facility]  [phone]

## 2018-02-27 NOTE — Telephone Encounter (Signed)
I called Kari [name] back and told her where the bus stop was. She stated she would make arrangements with her social worker for the Kari [name] to be brought and dropped off at the front of our office due to her legs. Stated Kari [name] is in bilateral una boots. Asked if there was any paperwork Kari [name] would need to fill out. I told her she could go to our web site and print out the new patient forms. Delrae Rend thanked me very much for my help.

## 2018-02-27 NOTE — Telephone Encounter (Signed)
Good afternoon, this is [name], a nurse case manager with the [facility]. I'm trying to help my pt who has an appointment on 13 March at 3:30 pm. Trying to help her sort out her transportation for that appointment. She is planning to take the bust to come to her appointment with [name]. However, she just got out of the hospital after about a week long stay for septic shock related to cellulitis to bilateral lower extremities. I don't know how far the bus stop is from your front door, but if she is going to have to do much more than a few yards of walking I'm going to have to get in touch with my social worker to make other plans to get her dropped off right at the door. I know where you are, but I don't know the distance from the bus stop to the front of your office. If you could please call me back at [phone]. Thank you.

## 2018-02-28 ENCOUNTER — Telehealth: Payer: Self-pay | Admitting: Adult Health

## 2018-02-28 ENCOUNTER — Other Ambulatory Visit: Payer: Self-pay | Admitting: *Deleted

## 2018-02-28 NOTE — Patient Outreach (Signed)
[facility]) Care Management  02/28/2018  Kari [name] Feb 27, 1961 [id number]  I spoke with [name] by phone today to follow up on post hospital provider appointment(s) and her need for follow up labs prior to her visit.   [name] has opted to change her new primary care provider to Dr. [name] [facility] (@ [address] [place]). Per her request, I assisted with cancelling her previously scheduled appointment at [facility] @ [facility] and she is now scheduled to see Dr. [name] on Tuesday, 03/04/18 @ 1:45pm for a new patient appointment. [name] needed labs done as per direction of her discharging provider during her hospitalization. I reached out to her cardiology provider, [name] who has been providing cardiology care for [name] and is to see [name] in the next 2 weeks. Dr. Purcell Nails agreed with the need for BMP to follow up on [name]'s kidney function and is sending an order to [name]'s home care agency so that a BMP can be drawn this weekend when her home health nurse visits and can be sent to both Dr. Purcell Nails and Dr. [name].   I have requested transportation assistance for [name] for her Tuesday, 03/04/18 appointment.   I have notified [facility] [phone] (f) [phone]) that all primary care communication should be directed to Dr. [name] @ (f) [phone].   Plan: I will follow up with [name] by phone on Monday, 03/03/18 in anticipation/preparation for her 03/04/18 new PCP visit.    [facility]  [phone]

## 2018-02-28 NOTE — Telephone Encounter (Signed)
NEW MESSAGE  Alisa from [facility] calling to request lab order for patient to have labs drawn on Saturday. Please call [phone]

## 2018-02-28 NOTE — Telephone Encounter (Signed)
Left message to call back  

## 2018-03-01 DIAGNOSIS — I70209 Unspecified atherosclerosis of native arteries of extremities, unspecified extremity: Secondary | ICD-10-CM | POA: Diagnosis not present

## 2018-03-01 DIAGNOSIS — I83028 Varicose veins of left lower extremity with ulcer other part of lower leg: Secondary | ICD-10-CM | POA: Diagnosis not present

## 2018-03-01 DIAGNOSIS — Z9181 History of falling: Secondary | ICD-10-CM | POA: Diagnosis not present

## 2018-03-01 DIAGNOSIS — Z7982 Long term (current) use of aspirin: Secondary | ICD-10-CM | POA: Diagnosis not present

## 2018-03-01 DIAGNOSIS — L03115 Cellulitis of right lower limb: Secondary | ICD-10-CM | POA: Diagnosis not present

## 2018-03-01 DIAGNOSIS — I5040 Unspecified combined systolic (congestive) and diastolic (congestive) heart failure: Secondary | ICD-10-CM | POA: Diagnosis not present

## 2018-03-01 DIAGNOSIS — L97811 Non-pressure chronic ulcer of other part of right lower leg limited to breakdown of skin: Secondary | ICD-10-CM | POA: Diagnosis not present

## 2018-03-01 DIAGNOSIS — I130 Hypertensive heart and chronic kidney disease with heart failure and stage 1 through stage 4 chronic kidney disease, or unspecified chronic kidney disease: Secondary | ICD-10-CM | POA: Diagnosis not present

## 2018-03-01 DIAGNOSIS — F329 Major depressive disorder, single episode, unspecified: Secondary | ICD-10-CM | POA: Diagnosis not present

## 2018-03-01 DIAGNOSIS — J45909 Unspecified asthma, uncomplicated: Secondary | ICD-10-CM | POA: Diagnosis not present

## 2018-03-01 DIAGNOSIS — Z6841 Body Mass Index (BMI) 40.0 and over, adult: Secondary | ICD-10-CM | POA: Diagnosis not present

## 2018-03-01 DIAGNOSIS — I4901 Ventricular fibrillation: Secondary | ICD-10-CM | POA: Diagnosis not present

## 2018-03-01 DIAGNOSIS — L03116 Cellulitis of left lower limb: Secondary | ICD-10-CM | POA: Diagnosis not present

## 2018-03-01 DIAGNOSIS — N183 Chronic kidney disease, stage 3 (moderate): Secondary | ICD-10-CM | POA: Diagnosis not present

## 2018-03-01 DIAGNOSIS — D631 Anemia in chronic kidney disease: Secondary | ICD-10-CM | POA: Diagnosis not present

## 2018-03-01 DIAGNOSIS — I83018 Varicose veins of right lower extremity with ulcer other part of lower leg: Secondary | ICD-10-CM | POA: Diagnosis not present

## 2018-03-01 DIAGNOSIS — I051 Rheumatic mitral insufficiency: Secondary | ICD-10-CM | POA: Diagnosis not present

## 2018-03-01 DIAGNOSIS — I255 Ischemic cardiomyopathy: Secondary | ICD-10-CM | POA: Diagnosis not present

## 2018-03-01 DIAGNOSIS — L97821 Non-pressure chronic ulcer of other part of left lower leg limited to breakdown of skin: Secondary | ICD-10-CM | POA: Diagnosis not present

## 2018-03-02 DIAGNOSIS — L03115 Cellulitis of right lower limb: Secondary | ICD-10-CM | POA: Diagnosis not present

## 2018-03-02 DIAGNOSIS — L97811 Non-pressure chronic ulcer of other part of right lower leg limited to breakdown of skin: Secondary | ICD-10-CM | POA: Diagnosis not present

## 2018-03-02 DIAGNOSIS — L97821 Non-pressure chronic ulcer of other part of left lower leg limited to breakdown of skin: Secondary | ICD-10-CM | POA: Diagnosis not present

## 2018-03-02 DIAGNOSIS — I83028 Varicose veins of left lower extremity with ulcer other part of lower leg: Secondary | ICD-10-CM | POA: Diagnosis not present

## 2018-03-02 DIAGNOSIS — I83018 Varicose veins of right lower extremity with ulcer other part of lower leg: Secondary | ICD-10-CM | POA: Diagnosis not present

## 2018-03-02 DIAGNOSIS — L03116 Cellulitis of left lower limb: Secondary | ICD-10-CM | POA: Diagnosis not present

## 2018-03-03 ENCOUNTER — Other Ambulatory Visit: Payer: Self-pay

## 2018-03-03 ENCOUNTER — Telehealth: Payer: Self-pay | Admitting: Family Medicine

## 2018-03-03 ENCOUNTER — Other Ambulatory Visit: Payer: Self-pay | Admitting: Adult Health

## 2018-03-03 DIAGNOSIS — L03116 Cellulitis of left lower limb: Secondary | ICD-10-CM | POA: Diagnosis not present

## 2018-03-03 DIAGNOSIS — L97811 Non-pressure chronic ulcer of other part of right lower leg limited to breakdown of skin: Secondary | ICD-10-CM | POA: Diagnosis not present

## 2018-03-03 DIAGNOSIS — L97821 Non-pressure chronic ulcer of other part of left lower leg limited to breakdown of skin: Secondary | ICD-10-CM | POA: Diagnosis not present

## 2018-03-03 DIAGNOSIS — Z79899 Other long term (current) drug therapy: Secondary | ICD-10-CM

## 2018-03-03 DIAGNOSIS — I83018 Varicose veins of right lower extremity with ulcer other part of lower leg: Secondary | ICD-10-CM | POA: Diagnosis not present

## 2018-03-03 DIAGNOSIS — L03115 Cellulitis of right lower limb: Secondary | ICD-10-CM | POA: Diagnosis not present

## 2018-03-03 DIAGNOSIS — I10 Essential (primary) hypertension: Secondary | ICD-10-CM

## 2018-03-03 DIAGNOSIS — I5043 Acute on chronic combined systolic (congestive) and diastolic (congestive) heart failure: Secondary | ICD-10-CM

## 2018-03-03 DIAGNOSIS — I83028 Varicose veins of left lower extremity with ulcer other part of lower leg: Secondary | ICD-10-CM | POA: Diagnosis not present

## 2018-03-03 NOTE — Progress Notes (Signed)
From: Clerance Lav, RN  Sent: 02/28/2018 11:59 AM  To: Lendon Colonel, NP  Subject: Burtis Junes!                      Hi Kathryn,   I hope you're having a great week (its almost the weekend - hallelujah!).   Very long story about Anaia Frith but she was recently in the hospital for sepsis r/t cellulitis, anemia, AKI/CKD. As you know she moved from [place] so I'd had her primary care transferred, at her request, to [facility] ([facility]). They bumped her appointment, she ended up in the hospital, we can't get her in there until the end of the month and when she realized she'd lose [place], wanted to go to a [facility] practice. I have her in next Tuesday with [name] @ [place] but she needs a BMP and I don't want her to have to make 2 trips out in [place] boots next week.   Would you feel comfortable sending an order for the BMP to her home health nurse ([facility] [phone]) (seeing her tomorrow Saturday 03/01/18) and indicating that results need to come to you and copied to Dr. [name] @ [phone]?   FYI - I rescheduled her to see you on 03/18/18.   Thank you!  Delrae Rend

## 2018-03-03 NOTE — Telephone Encounter (Signed)
Copied from [place]. Topic: General - Other >> Mar 03, 2018 12:14 PM Conception Chancy, NT wrote: Kari [name] is calling from [facility] is requesting verbal orders for physical therapy 2x a week for 4 weeks effective 03/02/18. Please advise. Patient establishes care 03/04/18 with Dr. [name]. Physical therapist aware.   Kari [name] Cb# [phone] ok to leave VM.

## 2018-03-03 NOTE — Telephone Encounter (Signed)
Appointment tomorrow 03/04/18 with Dr. [name]. Provider, please advise.

## 2018-03-03 NOTE — Telephone Encounter (Signed)
Copied from [place] [id number]. Topic: Quick Communication - See Telephone Encounter >> Mar 03, 2018  9:59 AM Arletha Grippe wrote: CRM for notification. See Telephone encounter for:   03/03/18. [facility]- verbal orders  Home health skilled nursing for disease mgmt and labs draw 1 week 1 2 week 8 3 prn visits.  Also requesting Physical therapy eval  Cb [phone]

## 2018-03-04 ENCOUNTER — Other Ambulatory Visit: Payer: Self-pay

## 2018-03-04 ENCOUNTER — Ambulatory Visit (INDEPENDENT_AMBULATORY_CARE_PROVIDER_SITE_OTHER): Payer: Medicare Other | Admitting: Family Medicine

## 2018-03-04 ENCOUNTER — Encounter: Payer: Self-pay | Admitting: Family Medicine

## 2018-03-04 VITALS — BP 108/62 | HR 70 | Temp 98.9°F | Ht 67.0 in | Wt 292.0 lb

## 2018-03-04 DIAGNOSIS — I872 Venous insufficiency (chronic) (peripheral): Secondary | ICD-10-CM | POA: Diagnosis not present

## 2018-03-04 DIAGNOSIS — N179 Acute kidney failure, unspecified: Secondary | ICD-10-CM

## 2018-03-04 DIAGNOSIS — E079 Disorder of thyroid, unspecified: Secondary | ICD-10-CM | POA: Diagnosis not present

## 2018-03-04 DIAGNOSIS — L03119 Cellulitis of unspecified part of limb: Secondary | ICD-10-CM | POA: Diagnosis not present

## 2018-03-04 DIAGNOSIS — R531 Weakness: Secondary | ICD-10-CM

## 2018-03-04 DIAGNOSIS — Z7189 Other specified counseling: Secondary | ICD-10-CM

## 2018-03-04 DIAGNOSIS — I5042 Chronic combined systolic (congestive) and diastolic (congestive) heart failure: Secondary | ICD-10-CM

## 2018-03-04 DIAGNOSIS — E875 Hyperkalemia: Secondary | ICD-10-CM

## 2018-03-04 DIAGNOSIS — D649 Anemia, unspecified: Secondary | ICD-10-CM | POA: Diagnosis not present

## 2018-03-04 DIAGNOSIS — Z7689 Persons encountering health services in other specified circumstances: Secondary | ICD-10-CM

## 2018-03-04 MED ORDER — BENZONATATE 100 MG PO CAPS: 100.0000 mg | ORAL_CAPSULE | Freq: Two times a day (BID) | ORAL | 0 refills | Status: DC | PRN

## 2018-03-04 NOTE — Patient Instructions (Signed)
     IF you received an x-ray today, you will receive an invoice from [facility]. Please contact [facility] at [phone] with questions or concerns regarding your invoice.   IF you received labwork today, you will receive an invoice from [facility]. Please contact [facility] at [phone] with questions or concerns regarding your invoice.   Our billing staff will not be able to assist you with questions regarding bills from these companies.  You will be contacted with the lab results as soon as they are available. The fastest way to get your results is to activate your My Chart account. Instructions are located on the last page of this paperwork. If you have not heard from us regarding the results in 2 weeks, please contact this office.     

## 2018-03-04 NOTE — Telephone Encounter (Signed)
Phone call to [place]. Verbal orders given for PT requested. She is agreeable, verbalizes understanding.

## 2018-03-04 NOTE — Telephone Encounter (Signed)
Per Delrae Rend, lab order has been taken care of.

## 2018-03-04 NOTE — Progress Notes (Signed)
3/12/20192:00 PM  [name] 1961-01-18, 57 y.o. female [id number]  Chief Complaint  Patient presents with  . Transitions Of Care    post hospital follow up. Released from hospital on Wednesday. Caught a cold while in hospiital, having some coughing.    HPI:   Patient is a 57 y.o. female with past medical history significant for CHF, s/p cardiac arrest in July 2018 with ACID, CKD3, acute on chronic anemia, chronic venous insufficiency who presents today for transition of care.  Patient was hospitalized from 02/20/18 - 02/26/18 Discharge diagnosis: Principal Problem:   Hyperkalemia - at presentation K > 7 Active Problems:   Venous stasis ulcers (HCC) - cellulitis, cause of sepsis   Morbid obesity (HCC)   CHF (congestive heart failure) (HCC) - both systolic and diastolic, Echo on 0/2542 showed EF 40-45%, CHF has been compensated    Hypokalemia - transient after treatment of hyperkalemia   Anemia - baseline ACD, worsening hgb down from baseline of 10-11 to 8, transfused 2u PRBCs   Shock circulatory (HCC) - from sepsis   Hypotension   Sepsis ([place])   Encounter for central line placement  Outreach phone calls done on 3/7 and 3/8 Patient overall feeling much better Has a mild cough from a cold but otherwise has no acute concerns today She lives alone, she has been doing sink baths due to wound dressings Sister who lives in apartment behind patient, has been caring for her Sister does cooking, cleaning and runs errand She denies any falls since discharged back home Patient does not drive since her cardiac arrest in July 2018 Wound care being done by home health, PT pending verbal orders given generalized weakness after sepsis and complicated hospital stay, requesting temporary disability placard She was discharged on doxycycline - taking as prescribed, tolerating well KCL was decreased from 29meq to 60meq given hyperkalemia on setting of acute on chronic kidney disease Patient  reports she has not been checking her weight at home, she does not have fluid restriction She uses a lymphedema compression machine about 2 hours a day She reports ulcers are healing well, she does not want to have dressing removed as it was just done this morning  Last BMP K 3.3, crt 1.18, cCa 9.8, PO4 2.1, H/H 8.2/25.9 before transfusion Past TSH have been slowly increasing, patient denies every having diagnosis of hypothyroidism  Cards at [facility], last appt jan 2019  Depression screen [facility] 2/9 03/04/2018 01/10/2018 08/09/2017  Decreased Interest 0 0 0  Down, Depressed, Hopeless 0 2 1  PHQ - 2 Score 0 2 1  Altered sleeping - 0 -  Tired, decreased energy - 0 -  Change in appetite - 0 -  Feeling bad or failure about yourself  - 0 -  Trouble concentrating - 0 -  Moving slowly or fidgety/restless - 0 -  Suicidal thoughts - 0 -  PHQ-9 Score - 2 -  Difficult doing work/chores - Somewhat difficult -    No Known Allergies  Prior to Admission medications   Medication Sig Start Date End Date Taking? Authorizing Provider  acetaminophen (TYLENOL 8 HOUR) 650 MG CR tablet Take 1 tablet (650 mg total) by mouth 2 (two) times daily. 12/05/17  Yes Strader, [place] M, PA-C  albuterol (PROVENTIL HFA;VENTOLIN HFA) 108 (90 Base) MCG/ACT inhaler Inhale 2 puffs into the lungs every 6 (six) hours as needed for shortness of breath. 07/06/17  Yes Mikhail, Velta Addison, DO  aspirin EC 81 MG tablet Take 81  mg by mouth daily.   Yes [provider]  carvedilol (COREG) 12.5 MG tablet Take 1 tablet (12.5 mg total) by mouth 2 (two) times daily with a meal. 12/26/17  Yes Strader, [place] M, PA-C  doxycycline (VIBRA-TABS) 100 MG tablet Take 1 tablet (100 mg total) by mouth every 12 (twelve) hours for 7 days. 02/26/18 03/05/18 Yes Adhikari Emeline General, MD  famotidine (PEPCID) 20 MG tablet Take 1 tablet (20 mg total) by mouth daily. 02/27/18  Yes Adhikari Emeline General, MD  lisinopril (PRINIVIL,ZESTRIL) 2.5 MG tablet Take  1 tablet (2.5 mg total) by mouth daily. 12/26/17 03/26/18 Yes Strader, Fransisco Hertz, PA-C  loratadine (CLARITIN) 10 MG tablet Take 10 mg by mouth daily.   Yes [provider]  ondansetron (ZOFRAN) 4 MG tablet Take 1 tablet (4 mg total) by mouth every 6 (six) hours as needed for nausea. 04/06/17  Yes Sinda Du, MD  potassium chloride SA (KLOR-CON M20) 20 MEQ tablet Take 1 tablet (20 mEq total) by mouth daily for 14 days. 02/26/18 03/12/18 Yes Adhikari Emeline General, MD  torsemide (DEMADEX) 20 MG tablet Take 1 tablet (20 mg total) by mouth daily. 12/26/17 03/26/18 Yes 58, Fransisco Hertz, PA-C  Meds reconcilled  Past Medical History:  Diagnosis Date  . Asthma   . CHF (congestive heart failure) ([place]) 03/12/2014   a. EF 40-45% by echo in 06/2017 with normal cors by cath. ICD placed following VT arrest  . Depression   . Headache(784.0)   . Hyperlipidemia   . Hypertension   . Lung nodules   . Renal disorder   . Sarcoidosis   . Ulcer    recurring, from chronic venous insufficiency    Past Surgical History:  Procedure Laterality Date  . cataract surgery Left 07-2013  . ICD IMPLANT N/A 07/05/2017   Procedure: ICD Implant;  Surgeon: Constance Haw, MD;  Location: [place] [facility];  Service: Cardiovascular;  Laterality: N/A;  . LEFT HEART CATH AND CORONARY ANGIOGRAPHY N/A 07/03/2017   Procedure: Left Heart Cath and Coronary Angiography;  Surgeon: Belva Crome, MD;  Location: [place] [facility];  Service: Cardiovascular;  Laterality: N/A;    Social History   Tobacco Use  . Smoking status: Never Smoker  . Smokeless tobacco: Never Used  Substance Use Topics  . Alcohol use: No    Family History  Problem Relation Age of Onset  . Heart failure Mother   . Diabetes Mellitus II Mother   . Hypertension Mother   . Cancer Mother        unknown type  . Heart disease Father   . Stroke Father   . Diabetes Mellitus II Father     Review of Systems  Constitutional: Positive for  malaise/fatigue. Negative for chills, fever and weight loss.  HENT: Positive for congestion. Negative for ear pain, sinus pain and sore throat.   Eyes: Negative for blurred vision and double vision.  Respiratory: Positive for cough. Negative for sputum production, shortness of breath and wheezing.   Cardiovascular: Positive for leg swelling. Negative for chest pain and palpitations.  Gastrointestinal: Negative for abdominal pain, constipation, diarrhea, nausea and vomiting.  Genitourinary: Negative for dysuria and hematuria.  Musculoskeletal: Negative for falls.  Neurological: Positive for weakness. Negative for dizziness, tingling, focal weakness and headaches.  Endo/Heme/Allergies: Negative for polydipsia. Does not bruise/bleed easily.  Psychiatric/Behavioral: Negative for depression. The patient is not nervous/anxious.     OBJECTIVE:  Blood pressure 108/62, pulse 70, temperature 98.9 F (  37.2 C), temperature source Oral, height 5\' 7"  (1.702 m), weight 292 lb (132.5 kg), last menstrual period 11/21/2011, SpO2 99 %.  Wt Readings from Last 3 Encounters:  03/04/18 292 lb (132.5 kg)  02/24/18 283 lb (128.4 kg)  12/26/17 (!) 306 lb (138.8 kg)    Physical Exam  Constitutional: She is oriented to person, place, and time and well-developed, well-nourished, and in no distress.  HENT:  Head: Normocephalic and atraumatic.  Right Ear: Hearing, tympanic membrane, external ear and ear canal normal.  Left Ear: Hearing, tympanic membrane, external ear and ear canal normal.  Mouth/Throat: Oropharynx is clear and moist.  Eyes: EOM are normal. Pupils are equal, round, and reactive to light.  Neck: Neck supple. No thyromegaly present.  Cardiovascular: Normal rate, regular rhythm and intact distal pulses. Exam reveals no gallop and no friction rub.  Murmur heard.  Systolic murmur is present with a grade of 2/6. Murmur best heard at LLSB  Pulmonary/Chest: Effort normal and breath sounds normal. She  has no wheezes. She has no rales.  Abdominal: Soft. Bowel sounds are normal. She exhibits no distension and no mass. There is no tenderness.  Musculoskeletal: Normal range of motion. She exhibits no edema.  BLE wrapped in dressing from ankle to knees, dressing is c/d/i  Lymphadenopathy:    She has no cervical adenopathy.  Neurological: She is alert and oriented to person, place, and time. She has normal reflexes. She displays weakness. Gait abnormal.  Skin: Skin is warm and dry.  Psychiatric: Mood and affect normal.  Nursing note and vitals reviewed.   ASSESSMENT and PLAN  1. Encounter for support and coordination of transition of care Patient's hospital records and outreach phone reviewed. Medications reconciled. She denies need for refills at this time. Verbal order for PT given general weakness, continue with SN for wound care. Patient declines outpatient wound care at this time. I discussed with patient that I would like to see her wounds at next visit, to please plan accordingly.   2. Hyperkalemia Resolved per last BMP, rechecking today, KCL dose will be adjusted accordingly - Comprehensive metabolic panel  3. Acute kidney injury ([place]) Resolved, prerenal in setting sepsis, back to baseline CKD stage 3. Continue to monitor, avoid nephrtoxin, BP at goal - Comprehensive metabolic panel  4. Cellulitis of lower extremity, unspecified laterality On doxycycline. Patient reports improvement, afebrile, no pain.   5. Venous (peripheral) insufficiency With recurring stasis ulcers, has compression device at home. Normal ABI in 2014. Venous duplex in the past well documented severe deep venous reflux with no correctable venous hypertension.Continue conservative management with compression and elevation.   6. Chronic combined systolic and diastolic congestive heart failure (HCC) Stable, continue current regime of ACE, BB and loop diuretic. FU with cards as scheduled.  - Comprehensive  metabolic panel - DME Other see comment  7. Anemia, unspecified type S/p tranfusion, rechecking CBC, has known ACD.  - CBC with Differential/Platelet  8. Thyroid disorder Last TSH > 9, has slowly been increasing, evaluation for hypothyroidism vs subclinical hypothyroidism - TSH - T4, free  9. General weakness Patient overall leads sedentary life due to severe venous insufficiency and now made worse by hospitalization and illness, verbal orders for home health PT given.   Other orders - benzonatate (TESSALON) 100 MG capsule; Take 1-2 capsules (100-200 mg total) by mouth 2 (two) times daily as needed for cough.  Return in about 2 weeks (around 03/18/2018).    Rutherford Guys, MD Primary Care  at [place], [place] [address] Ph.  [phone] Fax [phone]

## 2018-03-04 NOTE — Telephone Encounter (Signed)
Patient seen today, 03/04/18, please provide verbal orders for home Pt. thanks

## 2018-03-05 ENCOUNTER — Other Ambulatory Visit: Payer: Self-pay | Admitting: *Deleted

## 2018-03-05 ENCOUNTER — Ambulatory Visit (INDEPENDENT_AMBULATORY_CARE_PROVIDER_SITE_OTHER): Payer: Medicare Other | Admitting: Podiatry

## 2018-03-05 ENCOUNTER — Encounter: Payer: Self-pay | Admitting: Podiatry

## 2018-03-05 DIAGNOSIS — M79674 Pain in right toe(s): Secondary | ICD-10-CM

## 2018-03-05 DIAGNOSIS — B351 Tinea unguium: Secondary | ICD-10-CM | POA: Diagnosis not present

## 2018-03-05 DIAGNOSIS — M79675 Pain in left toe(s): Secondary | ICD-10-CM

## 2018-03-05 LAB — COMPREHENSIVE METABOLIC PANEL
ALT: 9 IU/L (ref 0–32)
AST: 17 IU/L (ref 0–40)
Albumin/Globulin Ratio: 0.7 — ABNORMAL LOW (ref 1.2–2.2)
Albumin: 3.1 g/dL — ABNORMAL LOW (ref 3.5–5.5)
Alkaline Phosphatase: 59 IU/L (ref 39–117)
BUN/Creatinine Ratio: 14 (ref 9–23)
BUN: 20 mg/dL (ref 6–24)
Bilirubin Total: 0.5 mg/dL (ref 0.0–1.2)
CO2: 22 mmol/L (ref 20–29)
Calcium: 8.7 mg/dL (ref 8.7–10.2)
Chloride: 105 mmol/L (ref 96–106)
Creatinine, Ser: 1.43 mg/dL — ABNORMAL HIGH (ref 0.57–1.00)
GFR calc Af Amer: 47 mL/min/{1.73_m2} — ABNORMAL LOW (ref >59)
GFR calc non Af Amer: 41 mL/min/{1.73_m2} — ABNORMAL LOW (ref >59)
Globulin, Total: 4.2 g/dL (ref 1.5–4.5)
Glucose: 109 mg/dL — ABNORMAL HIGH (ref 65–99)
Potassium: 4.9 mmol/L (ref 3.5–5.2)
Sodium: 141 mmol/L (ref 134–144)
Total Protein: 7.3 g/dL (ref 6.0–8.5)

## 2018-03-05 LAB — CBC WITH DIFFERENTIAL/PLATELET
Basophils Absolute: 0 10*3/uL (ref 0.0–0.2)
Basos: 0 %
EOS (ABSOLUTE): 0.2 10*3/uL (ref 0.0–0.4)
Eos: 4 %
Hematocrit: 29.3 % — ABNORMAL LOW (ref 34.0–46.6)
Hemoglobin: 9.1 g/dL — ABNORMAL LOW (ref 11.1–15.9)
Immature Grans (Abs): 0 10*3/uL (ref 0.0–0.1)
Immature Granulocytes: 0 %
Lymphocytes Absolute: 1.1 10*3/uL (ref 0.7–3.1)
Lymphs: 18 %
MCH: 27.4 pg (ref 26.6–33.0)
MCHC: 31.1 g/dL — ABNORMAL LOW (ref 31.5–35.7)
MCV: 88 fL (ref 79–97)
Monocytes Absolute: 0.7 10*3/uL (ref 0.1–0.9)
Monocytes: 11 %
Neutrophils Absolute: 4.1 10*3/uL (ref 1.4–7.0)
Neutrophils: 67 %
Platelets: 195 10*3/uL (ref 150–379)
RBC: 3.32 x10E6/uL — ABNORMAL LOW (ref 3.77–5.28)
RDW: 16.7 % — ABNORMAL HIGH (ref 12.3–15.4)
WBC: 6.2 10*3/uL (ref 3.4–10.8)

## 2018-03-05 LAB — TSH: TSH: 1.58 u[IU]/mL (ref 0.450–4.500)

## 2018-03-05 LAB — T4, FREE: Free T4: 1.41 ng/dL (ref 0.82–1.77)

## 2018-03-05 NOTE — Patient Outreach (Signed)
[place] [facility]) Care Management  03/05/2018  KEARSTEN GINTHER 05/11/1961 [id number]  I spoke with Ms. Leeb by phone today in follow up to her new PCP appointment yesterday. Ms. Izard was very pleased with her new provider experience.   PCP Provider Follow up - Ms. Elsasser related the following outcomes to me regarding her new PCP appointment yesterday:  1. Blood work was completed at the office 2. Sign off for home health orders was completed  3. Next provider appointment is in 2 weeks  4. Assistance with completion of Handicapped parking sign was completed  Podiatry Appointment - Ms. Mones's podiatry appointment is scheduled for today. Ms. Myszka is taking the bus to the appointment. Her sister will accompany her.   DME and Resources - Ms. Hinostroza relates that a walker was provided to her by a friend and she used it yesterday to go to her PCP appointment. She was grateful to have it as she still feels a little unsteady on her feet.   Someone recently moved out of her building and left a mini-fridge. Ms. Canady says he landlord helped her clean it and it was given to her at no charge. She will be able to keep her bottled water and small containers of food in it so that she doesn't have to walk to the common area/kitchen late in the evening or early in the morning.   Transportation - Ms. Dor relates that she has an appointment at the [place] office on Monday for completion of her application. The [employer] employee who scheduled the appointment also arranged for her to be picked up by Lucianne Lei for the appointment.   Plan: I will follow up with Ms. Maul by phone next week for ongoing transition of care assessment.    [place] Management  [phone]

## 2018-03-05 NOTE — Progress Notes (Signed)
   Subjective:    Patient ID: Kari [name], female    DOB: August 04, 1961, 57 y.o.   MRN: [id number]  HPIthis patient presents to the office for treatment of her long thick painful nails.  She had previously made an appointment for her nail care when she had a ulcer formation on both lower legs.  She says that  She was released from [facility] after   4 day of treatment of ulcers on her lower legs.  She presents the office today wearing Unna boots on both legs and feet .  Evaluation of her toes do reveal long thick painful toenails. She presents to the office for preventative foot care services in terms of her nail treatment .    Review of Systems  All other systems reviewed and are negative.      Objective:   Physical Exam General Appearance  Alert, conversant and in no acute stress.  Vascular  Deferred due to her unna boot application  B/L  Neurologic  Deferred due to her Unna boot application bilaterally  Nails Thick disfigured discolored nails with subungual debris  from hallux to fifth toes bilaterally. No evidence of bacterial infection or drainage bilaterally.  Orthopedic  Deferred due to her Unna boot application bilaterally  Skin  Deferred due  To her unna boot application  B/L        Assessment & Plan:  Onychomycosis  B/L  IE  Debridement of nails  X 10.  RTC 3 months   Gardiner Barefoot DPM

## 2018-03-05 NOTE — Telephone Encounter (Signed)
Phone call to Nicolette, unable to reach, mailbox full.   If Nicolette calls back, please transfer her to clinical staff at Primary care at [facility] so verbal orders can be given.

## 2018-03-05 NOTE — Telephone Encounter (Signed)
Nicolette calling back to get home health orders that she requested on Monday. She states pt was suppose to be seen this week and they are unable to until they get the verbal ok. CB#: [phone]

## 2018-03-06 ENCOUNTER — Other Ambulatory Visit: Payer: Self-pay | Admitting: *Deleted

## 2018-03-06 NOTE — Patient Outreach (Signed)
[facility]) Care Management  03/06/2018  Kari [name] 09-25-1961 [id number]   CSW called & spoke with patient to follow-up on her discharge from the hospital. Patient reports that she has been doing alright, just waiting for home care to start with [facility]. Patient also reports that she has an appointment with [employer] Monday at 9:30am. CSW will follow-up in 2 weeks to ensure that [employer] has been approved and no concerns.    Raynaldo Opitz, LCSW [facility]  Clinical Social Worker cell #: [phone]

## 2018-03-07 ENCOUNTER — Other Ambulatory Visit: Payer: Self-pay | Admitting: *Deleted

## 2018-03-07 NOTE — Patient Outreach (Signed)
[facility] [facility]) Care Management  03/07/2018  Kari [name] 03/09/1961 [id number]  Call received from Kari [name] and from Kari [name] @ [facility] [phone]; [phone]) re: Kari [name]'s need for a verbal or written order for continued [facility] visits for assessments and wound care to bilateral lower extremities.   I reached out to the office of Kari [name] [facility] @ [address] Kari [name]; [phone]) to request assistance with needed orders. I was told that the office has been trying to return a call to [facility] but has been unable to reach her. I returned a call to Kari [name] to pass along the message that Kari [name] has approved the orders and the office is trying to make contact with her.   Plan: I will follow up with Kari [name] as scheduled next week.    [place] Management  [phone]

## 2018-03-08 DIAGNOSIS — L03115 Cellulitis of right lower limb: Secondary | ICD-10-CM | POA: Diagnosis not present

## 2018-03-08 DIAGNOSIS — I83018 Varicose veins of right lower extremity with ulcer other part of lower leg: Secondary | ICD-10-CM | POA: Diagnosis not present

## 2018-03-08 DIAGNOSIS — L97821 Non-pressure chronic ulcer of other part of left lower leg limited to breakdown of skin: Secondary | ICD-10-CM | POA: Diagnosis not present

## 2018-03-08 DIAGNOSIS — I83028 Varicose veins of left lower extremity with ulcer other part of lower leg: Secondary | ICD-10-CM | POA: Diagnosis not present

## 2018-03-08 DIAGNOSIS — L97811 Non-pressure chronic ulcer of other part of right lower leg limited to breakdown of skin: Secondary | ICD-10-CM | POA: Diagnosis not present

## 2018-03-08 DIAGNOSIS — L03116 Cellulitis of left lower limb: Secondary | ICD-10-CM | POA: Diagnosis not present

## 2018-03-11 DIAGNOSIS — I83018 Varicose veins of right lower extremity with ulcer other part of lower leg: Secondary | ICD-10-CM | POA: Diagnosis not present

## 2018-03-11 DIAGNOSIS — L97811 Non-pressure chronic ulcer of other part of right lower leg limited to breakdown of skin: Secondary | ICD-10-CM | POA: Diagnosis not present

## 2018-03-11 DIAGNOSIS — L03115 Cellulitis of right lower limb: Secondary | ICD-10-CM | POA: Diagnosis not present

## 2018-03-11 DIAGNOSIS — L97821 Non-pressure chronic ulcer of other part of left lower leg limited to breakdown of skin: Secondary | ICD-10-CM | POA: Diagnosis not present

## 2018-03-11 DIAGNOSIS — I83028 Varicose veins of left lower extremity with ulcer other part of lower leg: Secondary | ICD-10-CM | POA: Diagnosis not present

## 2018-03-11 DIAGNOSIS — L03116 Cellulitis of left lower limb: Secondary | ICD-10-CM | POA: Diagnosis not present

## 2018-03-12 ENCOUNTER — Other Ambulatory Visit: Payer: Self-pay | Admitting: *Deleted

## 2018-03-12 DIAGNOSIS — L03115 Cellulitis of right lower limb: Secondary | ICD-10-CM | POA: Diagnosis not present

## 2018-03-12 DIAGNOSIS — I83028 Varicose veins of left lower extremity with ulcer other part of lower leg: Secondary | ICD-10-CM | POA: Diagnosis not present

## 2018-03-12 DIAGNOSIS — L97821 Non-pressure chronic ulcer of other part of left lower leg limited to breakdown of skin: Secondary | ICD-10-CM | POA: Diagnosis not present

## 2018-03-12 DIAGNOSIS — I83018 Varicose veins of right lower extremity with ulcer other part of lower leg: Secondary | ICD-10-CM | POA: Diagnosis not present

## 2018-03-12 DIAGNOSIS — L97811 Non-pressure chronic ulcer of other part of right lower leg limited to breakdown of skin: Secondary | ICD-10-CM | POA: Diagnosis not present

## 2018-03-12 DIAGNOSIS — L03116 Cellulitis of left lower limb: Secondary | ICD-10-CM | POA: Diagnosis not present

## 2018-03-12 NOTE — Patient Outreach (Signed)
[place] [facility]) Care Management  03/12/2018  Kari [name] 19-Feb-1961 [id number]  [name] was discharged from the hospital after an admission from 02/20/18-02/26/18 for treatment of acute kidney injury on stage 3 chronic kidney disease with hyperkalemia, lower extremity cellulitis with septic shock, and acute asymptomatic anemia with transfusion of 2 units of packed red blood cells.   PCP Provider Follow up - [name] related the following outcomes to me regarding her new PCP appointment yesterday:  1. Blood work was completed at the office 2. Sign off for home health orders was completed  3. Next provider appointment is in 2 weeks  4. Assistance with completion of Handicapped parking sign was completed  Podiatry Appointment - [name]'s podiatry appointment is scheduled for today. [name] is taking the bus to the appointment. Her sister will accompany her.   DME and Resources - [name] relates that a walker was provided to her by a friend and she used it yesterday to go to her PCP appointment. She was grateful to have it as she still feels a little unsteady on her feet.   Transportation - [name] has been approved for [employer] and says she feels confident about her knowledge of how to call for transportation assistance.  Provider Appointments - [name] is scheduled to see providers next week as outlined below and has called [employer] to arrange her transportation.   [name](Cardiology) 03/18/18 @1 :30pm  [name], MD (PCP) 03/21/18  @ 1pm  Plan: I will follow up with [name] by phone in 2 weeks with plans for transition to telephonic case management/health coaching if she continues to progress and remains stable.   [facility] CM Care Plan Problem One     Most Recent Value  Care Plan Problem One  Knowledge Deficits related to long term plan of care for Management of Lower Extremities/Wounds  Role Documenting the Problem One  Care  Management [place] for Problem One  Active  [facility] Long Term Goal   Over the next 31 days, patient will verbalize understanding of revised plan of care for managment of lower extremity wounds  [facility] Long Term Goal Start Date  02/13/18  Interventions for Problem One Long Term Goal  reviewed ongong pan of care for treatment of lower etremity wounds  [facility] CM Short Term Goal #1   Over the next 7 days, patient will verbalize understanding of home health schedule for dressing changes  [facility] CM Short Term Goal #1 Start Date  03/07/18     [place] Care Management  [phone]

## 2018-03-14 ENCOUNTER — Telehealth: Payer: Self-pay | Admitting: Family Medicine

## 2018-03-14 DIAGNOSIS — I83018 Varicose veins of right lower extremity with ulcer other part of lower leg: Secondary | ICD-10-CM | POA: Diagnosis not present

## 2018-03-14 DIAGNOSIS — I83028 Varicose veins of left lower extremity with ulcer other part of lower leg: Secondary | ICD-10-CM | POA: Diagnosis not present

## 2018-03-14 DIAGNOSIS — L97821 Non-pressure chronic ulcer of other part of left lower leg limited to breakdown of skin: Secondary | ICD-10-CM | POA: Diagnosis not present

## 2018-03-14 DIAGNOSIS — L03116 Cellulitis of left lower limb: Secondary | ICD-10-CM | POA: Diagnosis not present

## 2018-03-14 DIAGNOSIS — L97811 Non-pressure chronic ulcer of other part of right lower leg limited to breakdown of skin: Secondary | ICD-10-CM | POA: Diagnosis not present

## 2018-03-14 DIAGNOSIS — L03115 Cellulitis of right lower limb: Secondary | ICD-10-CM | POA: Diagnosis not present

## 2018-03-14 NOTE — Telephone Encounter (Signed)
Phone call to Rudi Rummage to gather more information. She states patient took off unna boots on both legs because they "Got too tight." This was only 48 hours after application. Rudi Rummage states she re-applied boot with 50% compression on Tuesday and patient took boot off within 24 hours. Rudi Rummage states patient went back to [place] on her own. Rudi Rummage states patient is not keeping Unna boots on at all.  Provider, are you agreeable with change from Unna boot to Santyl and wet to dry dressing 2x a week? Please advise.

## 2018-03-14 NOTE — Telephone Encounter (Signed)
Phone call to [place]. Relayed message from Dr. [name]. She verbalizes understanding. Rudi Rummage also states patient is requesting refill of Santyl, but has enough to last her until next appointment. Asha advised this will be refilled at her next appointment.

## 2018-03-14 NOTE — Telephone Encounter (Signed)
Copied from [place] [phone]. Topic: General - Other >> Mar 14, 2018  9:01 AM Margot Ables wrote: Reason for CRM: pt is unable to tolerate Unna boots. Requesting VO to change pt to Santyl and wet to dry dressing 2x week. Rudi Rummage has confidential VM and ok to leave detailed msg with VO.

## 2018-03-14 NOTE — Telephone Encounter (Signed)
Yes, ok to change treatment as patient is not tolerating unna boot. Please notify Rudi Rummage. Thanks

## 2018-03-17 NOTE — Progress Notes (Deleted)
Cardiology Office Note   Date:  03/17/2018   ID:  KRISANNE LICH, DOB 09-30-1961, MRN [id number]  PCP:  Rosita Fire, MD  Cardiologist: Dr. Harrington Challenger No chief complaint on file.    History of Present Illness: Kari [name] is a 57 y.o. female who presents for ongoing assessment and management of chronic combined systolic and diastolic heart failure (most recent recent echocardiogram 06/2017 EF of 40% to 45%).  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   At the time of last office visit, the patient was asymptomatic.  No medications were titrated due to symptoms of hypertension.  She was to continue EP follow-up visits due to ICD interrogation and management.    Past Medical History:  Diagnosis Date  . Asthma   . CHF (congestive heart failure) ([place]) 03/12/2014   a. EF 40-45% by echo in 06/2017 with normal cors by cath. ICD placed following VT arrest  . Depression   . Headache(784.0)   . Hyperlipidemia   . Hypertension   . Lung nodules   . Renal disorder   . Sarcoidosis   . Ulcer    recurring, from chronic venous insufficiency    Past Surgical History:  Procedure Laterality Date  . cataract surgery Left 07-2013  . ICD IMPLANT N/A 07/05/2017   Procedure: ICD Implant;  Surgeon: Constance Haw, MD;  Location: [facility];  Service: Cardiovascular;  Laterality: N/A;  . LEFT HEART CATH AND CORONARY ANGIOGRAPHY N/A 07/03/2017   Procedure: Left Heart Cath and Coronary Angiography;  Surgeon: Belva Crome, MD;  Location: [facility];  Service: Cardiovascular;  Laterality: N/A;     Current Outpatient Medications  Medication Sig Dispense Refill  . acetaminophen (TYLENOL 8 HOUR) 650 MG CR tablet Take 1 tablet (650 mg total) by mouth 2 (two) times daily. 60 tablet 11  . albuterol (PROVENTIL  HFA;VENTOLIN HFA) 108 (90 Base) MCG/ACT inhaler Inhale 2 puffs into the lungs every 6 (six) hours as needed for shortness of breath. 18 g 0  . aspirin EC 81 MG tablet Take 81 mg by mouth daily.    . benzonatate (TESSALON) 100 MG capsule Take 1-2 capsules (100-200 mg total) by mouth 2 (two) times daily as needed for cough. 20 capsule 0  . carvedilol (COREG) 12.5 MG tablet Take 1 tablet (12.5 mg total) by mouth 2 (two) times daily with a meal. 180 tablet 3  . collagenase (SANTYL) ointment Apply 1 application topically as needed (for wound care). Apply to wound BLE per Tx 15 g 0  . famotidine (PEPCID) 20 MG tablet Take 1 tablet (20 mg total) by mouth daily. 14 tablet 0  . lisinopril (PRINIVIL,ZESTRIL) 2.5 MG tablet Take 1 tablet (2.5 mg total) by mouth daily. 90 tablet 3  . loratadine (CLARITIN) 10 MG tablet Take 10 mg by mouth daily.    . ondansetron (ZOFRAN) 4 MG tablet Take 1 tablet (4 mg total) by mouth every 6 (six) hours as needed for nausea. 20 tablet 0  . potassium chloride SA (KLOR-CON M20) 20 MEQ tablet Take 1 tablet (20 mEq total) by mouth daily for 14 days. 14 tablet 0  . torsemide (DEMADEX) 20 MG tablet Take 1 tablet (20 mg total) by mouth daily. 90 tablet 3   No current facility-administered medications for this visit.  Allergies:   Patient has no known allergies.    Social History:  The patient  reports that she has never smoked. She has never used smokeless tobacco. She reports that she does not drink alcohol or use drugs.   Family History:  The patient's family history includes Cancer in her mother; Diabetes Mellitus II in her father and mother; Heart disease in her father; Heart failure in her mother; Hypertension in her mother; Stroke in her father.    ROS: All other systems are reviewed and negative. Unless otherwise mentioned in H&P    PHYSICAL EXAM: VS:  LMP 11/21/2011  , BMI There is no height or weight on file to calculate BMI. GEN: Well nourished, well developed,  in no acute distress  HEENT: normal  Neck: no JVD, carotid bruits, or masses Cardiac: ***RRR; no murmurs, rubs, or gallops,no edema  Respiratory:  clear to auscultation bilaterally, normal work of breathing GI: soft, nontender, nondistended, + BS MS: no deformity or atrophy  Skin: warm and dry, no rash Neuro:  Strength and sensation are intact Psych: euthymic mood, full affect   EKG:  EKG {ACTION; IS/IS AUQ:[id number]} ordered today. The ekg ordered today demonstrates ***   Recent Labs: 11/18/2017: B Natriuretic Peptide 81.0 02/24/2018: Magnesium 2.0 03/04/2018: ALT 9; BUN 20; Creatinine, Ser 1.43; Hemoglobin 9.1; Platelets 195; Potassium 4.9; Sodium 141; TSH 1.580    Lipid Panel    Component Value Date/Time   CHOL 135 06/21/2012 0950   TRIG 59 06/21/2012 0950   HDL 54 06/21/2012 0950   CHOLHDL 2.5 06/21/2012 0950   VLDL 12 06/21/2012 0950   LDLCALC 69 06/21/2012 0950      Wt Readings from Last 3 Encounters:  03/04/18 292 lb (132.5 kg)  02/24/18 283 lb (128.4 kg)  12/26/17 (!) 306 lb (138.8 kg)      Other studies Reviewed: Additional studies/ records that were reviewed today include: ***. Review of the above records demonstrates: ***   ASSESSMENT AND PLAN:  1.  ***   Current medicines are reviewed at length with the patient today.    Labs/ tests ordered today include: *** Phill Myron. [place] Pugh, ANP, AACC   03/17/2018 2:20 PM    [facility] [address], [place], [place] [address] Phone: [phone]; Fax: [phone]

## 2018-03-18 ENCOUNTER — Ambulatory Visit: Payer: Medicare Other | Admitting: Adult Health

## 2018-03-18 DIAGNOSIS — I83018 Varicose veins of right lower extremity with ulcer other part of lower leg: Secondary | ICD-10-CM | POA: Diagnosis not present

## 2018-03-18 DIAGNOSIS — L97811 Non-pressure chronic ulcer of other part of right lower leg limited to breakdown of skin: Secondary | ICD-10-CM | POA: Diagnosis not present

## 2018-03-18 DIAGNOSIS — L97821 Non-pressure chronic ulcer of other part of left lower leg limited to breakdown of skin: Secondary | ICD-10-CM | POA: Diagnosis not present

## 2018-03-18 DIAGNOSIS — L03115 Cellulitis of right lower limb: Secondary | ICD-10-CM | POA: Diagnosis not present

## 2018-03-18 DIAGNOSIS — L03116 Cellulitis of left lower limb: Secondary | ICD-10-CM | POA: Diagnosis not present

## 2018-03-18 DIAGNOSIS — I83028 Varicose veins of left lower extremity with ulcer other part of lower leg: Secondary | ICD-10-CM | POA: Diagnosis not present

## 2018-03-19 ENCOUNTER — Ambulatory Visit: Payer: Medicare Other | Admitting: Family Medicine

## 2018-03-20 DIAGNOSIS — L03115 Cellulitis of right lower limb: Secondary | ICD-10-CM | POA: Diagnosis not present

## 2018-03-20 DIAGNOSIS — I83028 Varicose veins of left lower extremity with ulcer other part of lower leg: Secondary | ICD-10-CM | POA: Diagnosis not present

## 2018-03-20 DIAGNOSIS — L03116 Cellulitis of left lower limb: Secondary | ICD-10-CM | POA: Diagnosis not present

## 2018-03-20 DIAGNOSIS — L97811 Non-pressure chronic ulcer of other part of right lower leg limited to breakdown of skin: Secondary | ICD-10-CM | POA: Diagnosis not present

## 2018-03-20 DIAGNOSIS — L97821 Non-pressure chronic ulcer of other part of left lower leg limited to breakdown of skin: Secondary | ICD-10-CM | POA: Diagnosis not present

## 2018-03-20 DIAGNOSIS — I83018 Varicose veins of right lower extremity with ulcer other part of lower leg: Secondary | ICD-10-CM | POA: Diagnosis not present

## 2018-03-21 ENCOUNTER — Ambulatory Visit (INDEPENDENT_AMBULATORY_CARE_PROVIDER_SITE_OTHER): Payer: Medicare Other | Admitting: Family Medicine

## 2018-03-21 ENCOUNTER — Telehealth: Payer: Self-pay | Admitting: Family Medicine

## 2018-03-21 ENCOUNTER — Encounter: Payer: Self-pay | Admitting: Family Medicine

## 2018-03-21 ENCOUNTER — Other Ambulatory Visit: Payer: Self-pay | Admitting: *Deleted

## 2018-03-21 ENCOUNTER — Other Ambulatory Visit: Payer: Self-pay

## 2018-03-21 VITALS — BP 134/86 | HR 89 | Temp 97.8°F | Ht 67.72 in | Wt 292.2 lb

## 2018-03-21 DIAGNOSIS — I83002 Varicose veins of unspecified lower extremity with ulcer of calf: Secondary | ICD-10-CM

## 2018-03-21 DIAGNOSIS — I5042 Chronic combined systolic (congestive) and diastolic (congestive) heart failure: Secondary | ICD-10-CM

## 2018-03-21 DIAGNOSIS — N179 Acute kidney failure, unspecified: Secondary | ICD-10-CM | POA: Diagnosis not present

## 2018-03-21 DIAGNOSIS — E875 Hyperkalemia: Secondary | ICD-10-CM | POA: Diagnosis not present

## 2018-03-21 DIAGNOSIS — I872 Venous insufficiency (chronic) (peripheral): Secondary | ICD-10-CM | POA: Diagnosis not present

## 2018-03-21 DIAGNOSIS — L97209 Non-pressure chronic ulcer of unspecified calf with unspecified severity: Secondary | ICD-10-CM

## 2018-03-21 LAB — BASIC METABOLIC PANEL
BUN/Creatinine Ratio: 16 (ref 9–23)
BUN: 25 mg/dL — ABNORMAL HIGH (ref 6–24)
CO2: 22 mmol/L (ref 20–29)
Calcium: 9.2 mg/dL (ref 8.7–10.2)
Chloride: 103 mmol/L (ref 96–106)
Creatinine, Ser: 1.6 mg/dL — ABNORMAL HIGH (ref 0.57–1.00)
GFR calc Af Amer: 41 mL/min/{1.73_m2} — ABNORMAL LOW (ref >59)
GFR calc non Af Amer: 36 mL/min/{1.73_m2} — ABNORMAL LOW (ref >59)
Glucose: 96 mg/dL (ref 65–99)
Potassium: 4.9 mmol/L (ref 3.5–5.2)
Sodium: 136 mmol/L (ref 134–144)

## 2018-03-21 MED ORDER — COLLAGENASE 250 UNIT/GM EX OINT: 1.0000 "application " | TOPICAL_OINTMENT | CUTANEOUS | 2 refills | Status: DC | PRN

## 2018-03-21 MED ORDER — HYDROCODONE-ACETAMINOPHEN 5-325 MG PO TABS: 1.0000 | ORAL_TABLET | Freq: Four times a day (QID) | ORAL | 0 refills | Status: DC | PRN

## 2018-03-21 NOTE — Patient Instructions (Signed)
     IF you received an x-ray today, you will receive an invoice from [facility]. Please contact [facility] at [phone] with questions or concerns regarding your invoice.   IF you received labwork today, you will receive an invoice from [facility]. Please contact [facility] at [phone] with questions or concerns regarding your invoice.   Our billing staff will not be able to assist you with questions regarding bills from these companies.  You will be contacted with the lab results as soon as they are available. The fastest way to get your results is to activate your My Chart account. Instructions are located on the last page of this paperwork. If you have not heard from us regarding the results in 2 weeks, please contact this office.     

## 2018-03-21 NOTE — Progress Notes (Signed)
3/29/201910:25 AM  Kari [name] 1961-02-27, 57 y.o. female [id number]  Chief Complaint  Patient presents with  . Follow-up    having some leg pain stil pain scale at an 8. Needing medication other than tramdol, give hallucinations. Wanting another coughing medication the insurance will pay for.     HPI:   Patient is a 57 y.o. female with past medical history significant for CHF, CKD, venous stasis ulcers who presents today for followup.  Patient reports continued wound care via home health She was not tolerating the unna boot, now doing santyl and wet to dry daily, needs refills Per patient nurse dressed wounds this morning Per patient wounds healing well, nurse mentioned that they have closed from 30cm to 8cm in diameter She is having a constant gnawing pain in her legs like a toothache Tylenol is not helping Unable to do NSAIDS Requesting something for pain  She states that her cough is overall improving, her insurance did not pay for tessalon pearls She denies any SOB, CP, palpitations, orthopnea, PND. Reports leg swelling significantly improved She does not follow liquid restriction, she does monitor salt intake  She reports back upto KCL BID, she denies any urinary retention, urgency, frequency She sees renal in may 6th  She uses a cane and takes the bus   Depression screen [facility] 2/9 03/21/2018 03/04/2018 01/10/2018  Decreased Interest 0 0 0  Down, Depressed, Hopeless 0 0 2  PHQ - 2 Score 0 0 2  Altered sleeping - - 0  Tired, decreased energy - - 0  Change in appetite - - 0  Feeling bad or failure about yourself  - - 0  Trouble concentrating - - 0  Moving slowly or fidgety/restless - - 0  Suicidal thoughts - - 0  PHQ-9 Score - - 2  Difficult doing work/chores - - Somewhat difficult    Allergies  Allergen Reactions  . Tramadol     hallucination    Prior to Admission medications   Medication Sig Start Date End Date Taking? Authorizing Provider    acetaminophen (TYLENOL 8 HOUR) 650 MG CR tablet Take 1 tablet (650 mg total) by mouth 2 (two) times daily. 12/05/17  Yes Strader, [place] M, PA-C  albuterol (PROVENTIL HFA;VENTOLIN HFA) 108 (90 Base) MCG/ACT inhaler Inhale 2 puffs into the lungs every 6 (six) hours as needed for shortness of breath. 07/06/17  Yes Mikhail, Velta Addison, DO  aspirin EC 81 MG tablet Take 81 mg by mouth daily.   Yes [provider]  benzonatate (TESSALON) 100 MG capsule Take 1-2 capsules (100-200 mg total) by mouth 2 (two) times daily as needed for cough. 03/04/18  Yes Rutherford Guys, MD  carvedilol (COREG) 12.5 MG tablet Take 1 tablet (12.5 mg total) by mouth 2 (two) times daily with a meal. 12/26/17  Yes Strader, [place] M, PA-C  famotidine (PEPCID) 20 MG tablet Take 1 tablet (20 mg total) by mouth daily. 02/27/18  Yes Shelly Coss, MD  lisinopril (PRINIVIL,ZESTRIL) 2.5 MG tablet Take 1 tablet (2.5 mg total) by mouth daily. 12/26/17 03/26/18 Yes Strader, Fransisco Hertz, PA-C  loratadine (CLARITIN) 10 MG tablet Take 10 mg by mouth daily.   Yes [provider]  ondansetron (ZOFRAN) 4 MG tablet Take 1 tablet (4 mg total) by mouth every 6 (six) hours as needed for nausea. 04/06/17  Yes Sinda Du, MD  torsemide (DEMADEX) 20 MG tablet Take 1 tablet (20 mg total) by mouth daily. 12/26/17 03/26/18 Yes [place], Fransisco Hertz,  PA-C  collagenase (SANTYL) ointment Apply 1 application topically as needed (for wound care). Apply to wound BLE per Tx Patient not taking: Reported on 03/21/2018 07/06/17   Cristal Ford, DO  potassium chloride SA (KLOR-CON M20) 20 MEQ tablet Take 1 tablet (20 mEq total) by mouth daily for 14 days. 02/26/18 03/12/18  Shelly Coss, MD    Past Medical History:  Diagnosis Date  . Asthma   . CHF (congestive heart failure) ([place]) 03/12/2014   a. EF 40-45% by echo in 06/2017 with normal cors by cath. ICD placed following VT arrest  . Depression   . Headache(784.0)   . Hyperlipidemia   .  Hypertension   . Lung nodules   . Renal disorder   . Sarcoidosis   . Ulcer    recurring, from chronic venous insufficiency    Past Surgical History:  Procedure Laterality Date  . cataract surgery Left 07-2013  . ICD IMPLANT N/A 07/05/2017   Procedure: ICD Implant;  Surgeon: Constance Haw, MD;  Location: [facility];  Service: Cardiovascular;  Laterality: N/A;  . LEFT HEART CATH AND CORONARY ANGIOGRAPHY N/A 07/03/2017   Procedure: Left Heart Cath and Coronary Angiography;  Surgeon: Belva Crome, MD;  Location: [facility] [place] [facility];  Service: Cardiovascular;  Laterality: N/A;    Social History   Tobacco Use  . Smoking status: Never Smoker  . Smokeless tobacco: Never Used  Substance Use Topics  . Alcohol use: No    Family History  Problem Relation Age of Onset  . Heart failure Mother   . Diabetes Mellitus II Mother   . Hypertension Mother   . Cancer Mother        unknown type  . Heart disease Father   . Stroke Father   . Diabetes Mellitus II Father     Review of Systems  Constitutional: Negative for chills and fever.  Respiratory: Positive for cough. Negative for sputum production, shortness of breath and wheezing.   Cardiovascular: Positive for leg swelling. Negative for chest pain and palpitations.  Gastrointestinal: Negative for abdominal pain, nausea and vomiting.  Musculoskeletal: Negative for falls.     OBJECTIVE:  Blood pressure 134/86, pulse 89, temperature 97.8 F (36.6 C), temperature source Oral, height 5' 7.72" (1.72 m), weight 292 lb 3.2 oz (132.5 kg), last menstrual period 11/21/2011, SpO2 99 %.  Wt Readings from Last 3 Encounters:  03/21/18 292 lb 3.2 oz (132.5 kg)  03/04/18 292 lb (132.5 kg)  02/24/18 283 lb (128.4 kg)    Physical Exam  Constitutional: She is oriented to person, place, and time and well-developed, well-nourished, and in no distress.  HENT:  Head: Normocephalic and atraumatic.  Mouth/Throat: Oropharynx is clear and  moist. No oropharyngeal exudate.  Eyes: Pupils are equal, round, and reactive to light. EOM are normal. No scleral icterus.  Neck: Neck supple. No JVD present.  Cardiovascular: Normal rate, regular rhythm and normal heart sounds. Exam reveals no gallop and no friction rub.  No murmur heard. Pulmonary/Chest: Effort normal and breath sounds normal. She has no wheezes. She has no rales.  Musculoskeletal:  Boths legs wrapped in c/d/i bandages. Ankles and thighs without any edema  Neurological: She is alert and oriented to person, place, and time. Gait normal.  Skin: Skin is warm and dry.    ASSESSMENT and PLAN  1. Venous (peripheral) insufficiency I spoke with Asha, home health nurse, and indeed she confirms wounds are healing well. Continue with daily Santyl and wet  to dry dressing.   2. Acute kidney injury ([place]) Last crt was slightly higher than baseline, concern for large PVR while in hospital, patient denies any symptoms. No signs of CHF overload today despite increase in weight. Has appt in may with nephro. Rechecking bmp today.  - Basic Metabolic Panel  3. Hyperkalemia This had resolved however patient has increased KCL dose to prior dosing levels, checking K today - Basic Metabolic Panel  4. Chronic combined systolic and diastolic congestive heart failure (HCC) Clinically stable. Discussed importance of fluid restriction and low salt diet  5. Venous stasis ulcer of calf, unspecified laterality, unspecified ulcer stage, unspecified whether varicose veins present ([place]) See above  Other orders - collagenase (SANTYL) ointment; Apply 1 application topically as needed (for wound care). Apply to wound BLE per Tx - HYDROcodone-acetaminophen (NORCO/VICODIN) 5-325 MG tablet; Take 1 tablet by mouth every 6 (six) hours as needed for moderate pain.  Return in about 1 month (around 04/18/2018).    Rutherford Guys, MD Primary Care at [place], [place] [address] Ph.   [phone] Fax [phone]

## 2018-03-21 NOTE — Telephone Encounter (Signed)
Copied from [place] #[id number]. >> Mar 21, 2018  3:24 PM Margot Ables wrote: Claiborne Billings called stating pt isn't sure on medication. Please call pt and clarify if she is supposed to be taking potassium. Pt call back [phone].

## 2018-03-21 NOTE — Patient Outreach (Signed)
[name]) Care Management  03/21/2018  Kari [name] 05/28/1961 [id number]   CSW called & spoke with patient to follow-up on community resources, patient shared that she has been approved for [facility] and says she feels confident about her knowledge of how to call for transportation assistance. Patient states that she used [facility] this morning for an appointment and had no issues. Patient states that she has a Publishing rights manager aide coming to her apartment daily 2 hours a day which patient is very grateful for. Patient did have a question about potassium & CSW reviewed 03/04/18 note from Dr. [name], patient's PCP which states for her to take 1 tablet daily for 14 days, which ended 03/12/18 and patient was wondering whether she should go back to taking 2 or none. CSW called [phone] and they will have someone give patient a call back.   CSW will perform a case closure on patient, as all goals of treatment have been met from social work standpoint and no additional social work needs have been identified at this time. CSW will notify patient's RNCM with [facility], Alisa of CSW's plans to close patient's case.   CSW will fax an update to patient's Primary Care Physician, Dr. [name] to ensure that they are aware of CSW's involvement with patient's plan of care.   CSW will ensure that patient is aware of RNCM with [facility] continued involvement with patient's care.    Raynaldo Opitz, LCSW [facility]  Clinical Social Worker cell #: [phone]

## 2018-03-24 ENCOUNTER — Other Ambulatory Visit: Payer: Self-pay | Admitting: *Deleted

## 2018-03-24 NOTE — Telephone Encounter (Signed)
We did not prescribe the potassium chloride Dr. Alison Murray was prescriber of this medication

## 2018-03-24 NOTE — Telephone Encounter (Signed)
Pt was discharged from the hospital on 3/6 and was given a prescription for potassium by Dr. Leo Rod and was told to take one tab daily x 14 days. Pt states she was seen by her PCP Dr. [name], in the office on Friday and mentioned this was well.  Pt wants to know if she needs to continue to take one tab daily or if she needs to take the potassium twice daily as she was taking the medication before being admitted to the hospital.

## 2018-03-24 NOTE — Patient Outreach (Addendum)
[facility]) Care Management  03/24/2018  ERRIN WHITELAW [birth year] [id number]  Harue Pribble Houghtonis an 57 y.o.femalewith a hx of hypertension, sarcoidosis, hyperlipidemia, mixed CHF with most recent echocardiogram revealing EF of 40%-45%, nonischemic cardiomyopathy, history of V. fib arrest requiring defibrillation, status post ICD implantation on 07/05/2017.  [facility] Care Management has been following Mrs. Storlie in the community at first for Transition of Cares services after her hospital admission in July 2018 then for ongoing complex care management and care coordination needs. She has been followed by nursing and social work services.  Ms. Campisi moved in the last few months from [place] to [place]. She has established care with a new primary care provider, Dr. [name] [facility] @ [address] [place]; [phone])  Care Management/Care Coordination around Chronic Health Conditions:  Medication Management - we were able to help Ms. Holden transfer her prescriptions to [facility] [phone], ([phone]) upon her move to [place]. Her medications are delivered to her monthly and she is taking all medications as prescribed.   Lower Extremity Edema/Lymphedema with Impaired Skin Integrity- Mrs. Buffone has been receivingtwiceweekly dressing changes (Santyl >W>D) by her home health nurse and has been performing the daily dressing changes independently on days her nurse does not come.She feels her legs are improving and says her home health nurse agrees. Ms. Frede is being seen twice weekly at home by her home health nurse from [facility] [phone], [phone]).   In addition, Ms. Rolon has established care with Dr. Gardiner Barefoot ([facility]) for podiatric care and was seen to establish care in [facility] on 03/05/18.   CHF/Volume Management- Ms. Eastridge'slasix was discontinued  and she was started on Demadex which she has been taking since 11/19/17.She has been taking Demadex as prescribed. She continues to weigh herself daily and record and can verbalize good understanding of signs and symptoms of volume overload and when to call for weight gain or worsened symptoms.Her weight has been stable over the last 3 months (292#-295#). She is followed closely by [name] @ [facility] on [facility] in [place].   Psychosocial Needs:   Grief- Ms. Umholtz's best friend died unexpectedlyrecently. Our LCSW Raynaldo Opitz provided immediate support and referred Ms. Detwiler to [place] for ongoing grief counseling.Ms Szeliga says she is progressing on her grief journey.  Housing and Transition- Ms. Inabinet moved to her new residence in [place] from [place] says she is glad to be close to her sister and feels her new living situation is good.  Transportation - Ms. Montecalvo was connected to [place] by our Education officer, museum and has been using the system for transportation. In addition, her new primary care provider helped her complete paperwork for a handicapped sticker/lanyard should she need it when riding to appointments with a friend or family member.   DME and Resources - We were able to help Ms. Fillingim get a walker that she has been using over the last month which she says has helped tremendously especially when she has to go out for appointments.   I discussed with Ms. Bellows the incredible progress she has made in the last several months which happened during times of loss and grief and included a move to a new city. Ms. Wigglesworth has worked hard to take good care of herself and has met all the self health management goals she and I set together.   Plan: Ms. Bernick agrees  that she has made tremendous progress and says today that she feels she is ready to "graduate". I offered to refer Ms. Nadal to our telephonic  case management team for ongoing outreach but she declined stating she would call (me) if she felt she needed assistance. She says she feels well connected to her providers and resources in the area and is confident that he sister will help her if needed as well.   I will close Ms. Nier's case.   [facility] CM Care Plan Problem One     Most Recent Value  Care Plan Problem One  Knowledge Deficits related to long term plan of care for Management of Lower Extremities/Wounds  Role Documenting the Problem One  Care Management [place] for Problem One  Not Active  [facility] Long Term Goal   Over the next 31 days, patient will verbalize understanding of revised plan of care for managment of lower extremity wounds  [facility] Long Term Goal Start Date  02/13/18  [facility] Long Term Goal Met Date  03/24/18  Interventions for Problem One Long Term Goal  reviewed current status and long term plan for management      [place] Management  [phone]

## 2018-03-24 NOTE — Telephone Encounter (Signed)
Please advise on Potasium medication

## 2018-03-24 NOTE — Telephone Encounter (Signed)
Pt called back to get some advice on what to do about the potassium b/c pt states she does not know how to get in contact w/ the physician that prescribed the medication she is worried about this b/c she does not want to mess up her kidney function, call pt to give direction or reassurance

## 2018-03-25 DIAGNOSIS — L97811 Non-pressure chronic ulcer of other part of right lower leg limited to breakdown of skin: Secondary | ICD-10-CM | POA: Diagnosis not present

## 2018-03-25 DIAGNOSIS — L97821 Non-pressure chronic ulcer of other part of left lower leg limited to breakdown of skin: Secondary | ICD-10-CM | POA: Diagnosis not present

## 2018-03-25 DIAGNOSIS — L03115 Cellulitis of right lower limb: Secondary | ICD-10-CM | POA: Diagnosis not present

## 2018-03-25 DIAGNOSIS — I83028 Varicose veins of left lower extremity with ulcer other part of lower leg: Secondary | ICD-10-CM | POA: Diagnosis not present

## 2018-03-25 DIAGNOSIS — I83018 Varicose veins of right lower extremity with ulcer other part of lower leg: Secondary | ICD-10-CM | POA: Diagnosis not present

## 2018-03-25 DIAGNOSIS — L03116 Cellulitis of left lower limb: Secondary | ICD-10-CM | POA: Diagnosis not present

## 2018-03-25 NOTE — Telephone Encounter (Signed)
Please let patient know that while her last potassium was okay, her kidney function is slowly decreasing. Please have her take her potassium only ONCE a day. It is also very important that she keeps her appointment with the kidney specialist. I would also like for her to start checking her weight every day. I am concerned her decreased renal function is related to her congestive heart failure. I would like to see her in 1 week with her daily weights. Thank you.  I [place]

## 2018-03-26 NOTE — Telephone Encounter (Signed)
Patient was advised. Voiced understanding 

## 2018-03-28 ENCOUNTER — Telehealth: Payer: Self-pay | Admitting: Family Medicine

## 2018-03-28 DIAGNOSIS — I83028 Varicose veins of left lower extremity with ulcer other part of lower leg: Secondary | ICD-10-CM | POA: Diagnosis not present

## 2018-03-28 DIAGNOSIS — L03116 Cellulitis of left lower limb: Secondary | ICD-10-CM | POA: Diagnosis not present

## 2018-03-28 DIAGNOSIS — L97821 Non-pressure chronic ulcer of other part of left lower leg limited to breakdown of skin: Secondary | ICD-10-CM | POA: Diagnosis not present

## 2018-03-28 DIAGNOSIS — L03115 Cellulitis of right lower limb: Secondary | ICD-10-CM | POA: Diagnosis not present

## 2018-03-28 DIAGNOSIS — L97811 Non-pressure chronic ulcer of other part of right lower leg limited to breakdown of skin: Secondary | ICD-10-CM | POA: Diagnosis not present

## 2018-03-28 DIAGNOSIS — I83018 Varicose veins of right lower extremity with ulcer other part of lower leg: Secondary | ICD-10-CM | POA: Diagnosis not present

## 2018-03-28 NOTE — Telephone Encounter (Signed)
Left detailed message with verbal order.

## 2018-03-28 NOTE — Telephone Encounter (Signed)
Copied from [place] [id number]. Topic: General - Other >> Mar 28, 2018  1:09 PM Valla Leaver wrote: Reason for CRM: Tillie Rung, PT w/ [facility] calling for verbal orders for Pt, frequency 2x a wk for 4wks.

## 2018-03-28 NOTE — Telephone Encounter (Signed)
Verbal given to other person   Copied from [place] [phone]. Topic: Quick Communication - See Telephone Encounter >> Mar 28, 2018  1:21 PM Antonieta Iba C wrote: CRM for notification. See Telephone encounter for: 03/28/18.  Lavel from home health called in to make provider aware that she was unable to see pt because she never received orders.   Please advise. [phone]

## 2018-04-01 DIAGNOSIS — I83028 Varicose veins of left lower extremity with ulcer other part of lower leg: Secondary | ICD-10-CM | POA: Diagnosis not present

## 2018-04-01 DIAGNOSIS — L97821 Non-pressure chronic ulcer of other part of left lower leg limited to breakdown of skin: Secondary | ICD-10-CM | POA: Diagnosis not present

## 2018-04-01 DIAGNOSIS — I83018 Varicose veins of right lower extremity with ulcer other part of lower leg: Secondary | ICD-10-CM | POA: Diagnosis not present

## 2018-04-01 DIAGNOSIS — L97811 Non-pressure chronic ulcer of other part of right lower leg limited to breakdown of skin: Secondary | ICD-10-CM | POA: Diagnosis not present

## 2018-04-01 DIAGNOSIS — L03115 Cellulitis of right lower limb: Secondary | ICD-10-CM | POA: Diagnosis not present

## 2018-04-01 DIAGNOSIS — L03116 Cellulitis of left lower limb: Secondary | ICD-10-CM | POA: Diagnosis not present

## 2018-04-02 ENCOUNTER — Other Ambulatory Visit: Payer: Self-pay | Admitting: Family Medicine

## 2018-04-02 NOTE — Telephone Encounter (Signed)
Copied from [place] [id number]. Topic: Quick Communication - Rx Refill/Question >> Apr 02, 2018 10:01 AM Corie Chiquito, NT wrote: Medication: Hydrocodone-acetaminophen 5-325 mg Has the patient contacted their pharmacy? No Patient calling because she needs a refill on the above medication.Stated that she was told by her doctor to call office for refills. Did advise patient that she would need to call her pharmacy first for refills Preferred Pharmacy (with phone number or street name):[facility] Agent: Please be advised that RX refills may take up to 3 business days. We ask that you follow-up with your pharmacy.

## 2018-04-02 NOTE — Telephone Encounter (Signed)
Rx refill request: hydrocodone acetaminophen 5-325 mg  LOV: 03/21/18  PCP: [place]: [facility]

## 2018-04-03 DIAGNOSIS — L03116 Cellulitis of left lower limb: Secondary | ICD-10-CM | POA: Diagnosis not present

## 2018-04-03 DIAGNOSIS — L97821 Non-pressure chronic ulcer of other part of left lower leg limited to breakdown of skin: Secondary | ICD-10-CM | POA: Diagnosis not present

## 2018-04-03 DIAGNOSIS — I83028 Varicose veins of left lower extremity with ulcer other part of lower leg: Secondary | ICD-10-CM | POA: Diagnosis not present

## 2018-04-03 DIAGNOSIS — I83018 Varicose veins of right lower extremity with ulcer other part of lower leg: Secondary | ICD-10-CM | POA: Diagnosis not present

## 2018-04-03 DIAGNOSIS — L03115 Cellulitis of right lower limb: Secondary | ICD-10-CM | POA: Diagnosis not present

## 2018-04-03 DIAGNOSIS — L97811 Non-pressure chronic ulcer of other part of right lower leg limited to breakdown of skin: Secondary | ICD-10-CM | POA: Diagnosis not present

## 2018-04-03 NOTE — Progress Notes (Signed)
Cardiology Office Note   Date:  04/07/2018   ID:  [name], Civil [birth year], MRN [id number]  PCP:  Rutherford Guys, MD  Cardiologist: Dr. Harrington Challenger Chief Complaint  Patient presents with  . Coronary Artery Disease  . Congestive Heart Failure  . Hypertension     History of Present Illness: Kari [name] is a 57 y.o. female who presents for ongoing assessment and management of chronic combined systolic and diastolic heart failure (is also revealed moderate MR, normal Cors by cath in July 2018, hypertension, hyperlipidemia, history of VT arrest status post [place] ICD placement and 06/2017.  She was last seen in the office on 12/26/2017 by Kari Pho, PA, was doing well, without any cardiac symptoms.  Due to history of chronic lymphedema she using her compression device for 2 hours a day.  The patient has had ICD checked on 01/13/2018 with no shock therapies, and no changes in parameters.   She is here today feeling better since she has in a while with the exception of a tickle cough.  The patient has had hospitalization for pneumonia/influenza, and is also had acute kidney injury.  Was discharged on 02/26/2018.  She has been referred to nephrology for ongoing management of chronic kidney disease.  She is being followed by home health nurse for chronic venous stasis, along with wounds and wrapping.  Recent admission also found that she had cellulitis with septic shock requiring vasopressors on admission.    She has gained approximately 8 pounds since discharge.  She states she is been eating more but does not add salt to her foods.  Her sister does the cooking at home and as well.  She is recently moved to [place] from [place], and is now living with her sister is also being followed by [facility] for ongoing surveillance as outpatient.  Past Medical History:  Diagnosis Date  . Asthma   . CHF (congestive heart failure) ([place]) 03/12/2014   a. EF 40-45% by echo in 06/2017 with normal  cors by cath. ICD placed following VT arrest  . Depression   . Headache(784.0)   . Hyperlipidemia   . Hypertension   . Lung nodules   . Renal disorder   . Sarcoidosis   . Ulcer    recurring, from chronic venous insufficiency    Past Surgical History:  Procedure Laterality Date  . cataract surgery Left 07-2013  . ICD IMPLANT N/A 07/05/2017   Procedure: ICD Implant;  Surgeon: Constance Haw, MD;  Location: [place] [facility];  Service: Cardiovascular;  Laterality: N/A;  . LEFT HEART CATH AND CORONARY ANGIOGRAPHY N/A 07/03/2017   Procedure: Left Heart Cath and Coronary Angiography;  Surgeon: Belva Crome, MD;  Location: [place] [facility];  Service: Cardiovascular;  Laterality: N/A;     Current Outpatient Medications  Medication Sig Dispense Refill  . acetaminophen (TYLENOL 8 HOUR) 650 MG CR tablet Take 1 tablet (650 mg total) by mouth 2 (two) times daily. 60 tablet 11  . albuterol (PROVENTIL HFA;VENTOLIN HFA) 108 (90 Base) MCG/ACT inhaler Inhale 2 puffs into the lungs every 6 (six) hours as needed for shortness of breath. 18 g 0  . aspirin EC 81 MG tablet Take 81 mg by mouth daily.    . carvedilol (COREG) 12.5 MG tablet Take 1 tablet (12.5 mg total) by mouth 2 (two) times daily with a meal. 180 tablet 3  . collagenase (SANTYL) ointment Apply 1 application topically as needed (for wound care). Apply  to wound BLE per Tx 90 g 2  . famotidine (PEPCID) 20 MG tablet Take 1 tablet (20 mg total) by mouth daily. 14 tablet 0  . HYDROcodone-acetaminophen (NORCO/VICODIN) 5-325 MG tablet Take 1 tablet by mouth every 6 (six) hours as needed for moderate pain. 20 tablet 0  . loratadine (CLARITIN) 10 MG tablet Take 10 mg by mouth daily.    . ondansetron (ZOFRAN) 4 MG tablet Take 1 tablet (4 mg total) by mouth every 6 (six) hours as needed for nausea. 20 tablet 0  . lisinopril (PRINIVIL,ZESTRIL) 2.5 MG tablet Take 1 tablet (2.5 mg total) by mouth daily. 90 tablet 3  . potassium chloride SA  (KLOR-CON M20) 20 MEQ tablet Take 1 tablet (20 mEq total) by mouth daily for 14 days. 14 tablet 0  . torsemide (DEMADEX) 20 MG tablet Take 1 tablet (20 mg total) by mouth daily. 90 tablet 3   No current facility-administered medications for this visit.     Allergies:   Tramadol    Social History:  The patient  reports that she has never smoked. She has never used smokeless tobacco. She reports that she does not drink alcohol or use drugs.   Family History:  The patient's family history includes Cancer in her mother; Diabetes Mellitus II in her father and mother; Heart disease in her father; Heart failure in her mother; Hypertension in her mother; Stroke in her father.    ROS: All other systems are reviewed and negative. Unless otherwise mentioned in H&P    PHYSICAL EXAM: VS:  BP 104/68 (BP Location: Left Arm, Patient Position: Sitting, Cuff Size: Large)   Pulse 84   Ht 5\' 7"  (1.702 m)   Wt (!) 303 lb 9.6 oz (137.7 kg)   LMP 11/21/2011   SpO2 94%   BMI 47.55 kg/m  , BMI Body mass index is 47.55 kg/m. GEN: Well nourished, well developed, in no acute distress  HEENT: normal  Neck: no JVD, carotid bruits, or masses Cardiac: RRR; distant heart sounds no murmurs, rubs, or gallops,no edema  Respiratory: Bibasilar crackles, no wheezing, chronic coughing with deep inspiration,  GI: soft, nontender, nondistended, + BS MS: no deformity or atrophy  Skin: warm and dry, no rash Neuro:  Strength and sensation are intact Psych: euthymic mood, full affect   Recent Labs: 11/18/2017: B Natriuretic Peptide 81.0 02/24/2018: Magnesium 2.0 03/04/2018: ALT 9; Hemoglobin 9.1; Platelets 195; TSH 1.580 03/21/2018: BUN 25; Creatinine, Ser 1.60; Potassium 4.9; Sodium 136    Lipid Panel    Component Value Date/Time   CHOL 135 06/21/2012 0950   TRIG 59 06/21/2012 0950   HDL 54 06/21/2012 0950   CHOLHDL 2.5 06/21/2012 0950   VLDL 12 06/21/2012 0950   LDLCALC 69 06/21/2012 0950      Wt Readings  from Last 3 Encounters:  04/07/18 (!) 303 lb 9.6 oz (137.7 kg)  03/21/18 292 lb 3.2 oz (132.5 kg)  03/04/18 292 lb (132.5 kg)      Other studies Reviewed: Echocardiogram 2017/07/11 Left ventricle: Diffuse hypokinesis worse in the inferior basal   wall The cavity size was moderately dilated. Wall thickness was   increased in a pattern of moderate LVH. Systolic function was   mildly to moderately reduced. The estimated ejection fraction was   in the range of 40% to 45%. Left ventricular diastolic function   parameters were normal. - Aortic valve: There was mild regurgitation. - Mitral valve: There was moderate regurgitation. - Left atrium: The atrium  was moderately dilated. - Atrial septum: No defect or patent foramen ovale was identified.  Cardiac Cath 07/03/2017 Conclusion    Left dominant coronary anatomy.  Normal coronary arteries.  Mild to moderate global left ventricular hypo-kinesis with estimated EF 35-40%. LV end-diastolic pressure is up and normal. Combined systolic and diastolic heart failure, chronic   Recommendations:   Further management per treating team including CAD including guideline directed heart failure therapy  EP evaluation for possible AICD.     ASSESSMENT AND PLAN:  1.  Acute on chronic systolic and diastolic heart failure: The patient remains on torsemide 20 mg daily.  I am hearing some bibasilar crackles with no history of COPD with frequent coughing noted.  The patient has been taking over-the-counter Robitussin.  I am going to increase her torsemide to 30 mg a day for 2 days only with increased dose of potassium 1 tablet extra per day for the next 2 days with extra torsemide.  Due to chronic tickle cough as well, I am going to discontinue lisinopril for 2 days to evaluate how well she is doing on her coughing to see if this is in fact contributing to it.  If there is no change she will return to taking lisinopril daily.  She is going to call us  in a couple of days to let us know how she is doing.  She will continue with daily weights and salt restricted diet.  2.  ICD in situ: The patient is going to be interrogated over the phone on April 22 at 1 PM by our pacemaker clinic.  I have reminded her of this and verified her home phone number.  3.  Chronic lower extremity edema with wounds.  Followed by home health nurse, wound care is completed with wet-to-dry dressings and wrapping.  She will follow-up with PCP concerning ongoing management.  4.  Hypertension: Low normal today.  Will follow closely.  We will see her in 3 months but will talk with her by phone in a few days.  Current medicines are reviewed at length with the patient today.    Labs/ tests ordered today include: BMET ICD in [place]. [place], ANP, AACC   04/07/2018 8:29 AM    [facility] [address] [place], [place] [address] Phone: [phone]; Fax: [phone]

## 2018-04-07 ENCOUNTER — Encounter: Payer: Self-pay | Admitting: Adult Health

## 2018-04-07 ENCOUNTER — Ambulatory Visit (INDEPENDENT_AMBULATORY_CARE_PROVIDER_SITE_OTHER): Payer: Medicare Other | Admitting: Adult Health

## 2018-04-07 VITALS — BP 104/68 | HR 84 | Ht 67.0 in | Wt 303.6 lb

## 2018-04-07 DIAGNOSIS — I5043 Acute on chronic combined systolic (congestive) and diastolic (congestive) heart failure: Secondary | ICD-10-CM

## 2018-04-07 DIAGNOSIS — N183 Chronic kidney disease, stage 3 unspecified: Secondary | ICD-10-CM

## 2018-04-07 DIAGNOSIS — Z95810 Presence of automatic (implantable) cardiac defibrillator: Secondary | ICD-10-CM

## 2018-04-07 DIAGNOSIS — I10 Essential (primary) hypertension: Secondary | ICD-10-CM | POA: Diagnosis not present

## 2018-04-07 DIAGNOSIS — Z79899 Other long term (current) drug therapy: Secondary | ICD-10-CM

## 2018-04-07 NOTE — Patient Instructions (Signed)
Medication Instructions:  HOLD LISINOPRIL FOR 2 DAYS-TO SEE IF SX STOP  TAKE EXTRA POTASSIUM  AND INCREASE TORSEMIDE TO 30MG  DAILY FOR 2 DAYS, THEN RETURN TO REGULAR DOSING  If you need a refill on your cardiac medications before your next appointment, please call your pharmacy.  Labwork: BMET ON Thursday OR Friday HERE IN OUR OFFICE AT [facility]  Take the provided lab slips for you to take with you to the lab for you blood draw.   You will NOT need to fast   Special Instructions: CALL ON Thursday AND LET us KNOW HOW COUGH IS DOING  Follow-Up: Your physician wants you to follow-up in: [place] (NURSE PRACTIONIER), DNP,AAA.   Thank you for choosing [facility] at [facility]!!

## 2018-04-08 DIAGNOSIS — L97821 Non-pressure chronic ulcer of other part of left lower leg limited to breakdown of skin: Secondary | ICD-10-CM | POA: Diagnosis not present

## 2018-04-08 DIAGNOSIS — L03116 Cellulitis of left lower limb: Secondary | ICD-10-CM | POA: Diagnosis not present

## 2018-04-08 DIAGNOSIS — I83028 Varicose veins of left lower extremity with ulcer other part of lower leg: Secondary | ICD-10-CM | POA: Diagnosis not present

## 2018-04-08 DIAGNOSIS — L03115 Cellulitis of right lower limb: Secondary | ICD-10-CM | POA: Diagnosis not present

## 2018-04-08 DIAGNOSIS — L97811 Non-pressure chronic ulcer of other part of right lower leg limited to breakdown of skin: Secondary | ICD-10-CM | POA: Diagnosis not present

## 2018-04-08 DIAGNOSIS — I83018 Varicose veins of right lower extremity with ulcer other part of lower leg: Secondary | ICD-10-CM | POA: Diagnosis not present

## 2018-04-08 MED ORDER — HYDROCODONE-ACETAMINOPHEN 5-325 MG PO TABS: 1.0000 | ORAL_TABLET | Freq: Four times a day (QID) | ORAL | 0 refills | Status: DC | PRN

## 2018-04-09 DIAGNOSIS — I129 Hypertensive chronic kidney disease with stage 1 through stage 4 chronic kidney disease, or unspecified chronic kidney disease: Secondary | ICD-10-CM | POA: Diagnosis not present

## 2018-04-09 DIAGNOSIS — L03115 Cellulitis of right lower limb: Secondary | ICD-10-CM | POA: Diagnosis not present

## 2018-04-09 DIAGNOSIS — E875 Hyperkalemia: Secondary | ICD-10-CM | POA: Diagnosis not present

## 2018-04-09 DIAGNOSIS — N183 Chronic kidney disease, stage 3 (moderate): Secondary | ICD-10-CM | POA: Diagnosis not present

## 2018-04-09 DIAGNOSIS — L03116 Cellulitis of left lower limb: Secondary | ICD-10-CM | POA: Diagnosis not present

## 2018-04-09 DIAGNOSIS — I83028 Varicose veins of left lower extremity with ulcer other part of lower leg: Secondary | ICD-10-CM | POA: Diagnosis not present

## 2018-04-09 DIAGNOSIS — D631 Anemia in chronic kidney disease: Secondary | ICD-10-CM | POA: Diagnosis not present

## 2018-04-09 DIAGNOSIS — I83018 Varicose veins of right lower extremity with ulcer other part of lower leg: Secondary | ICD-10-CM | POA: Diagnosis not present

## 2018-04-09 DIAGNOSIS — L97811 Non-pressure chronic ulcer of other part of right lower leg limited to breakdown of skin: Secondary | ICD-10-CM | POA: Diagnosis not present

## 2018-04-09 DIAGNOSIS — L97821 Non-pressure chronic ulcer of other part of left lower leg limited to breakdown of skin: Secondary | ICD-10-CM | POA: Diagnosis not present

## 2018-04-10 ENCOUNTER — Telehealth: Payer: Self-pay | Admitting: Adult Health

## 2018-04-10 DIAGNOSIS — I83028 Varicose veins of left lower extremity with ulcer other part of lower leg: Secondary | ICD-10-CM | POA: Diagnosis not present

## 2018-04-10 DIAGNOSIS — I83018 Varicose veins of right lower extremity with ulcer other part of lower leg: Secondary | ICD-10-CM | POA: Diagnosis not present

## 2018-04-10 DIAGNOSIS — L03116 Cellulitis of left lower limb: Secondary | ICD-10-CM | POA: Diagnosis not present

## 2018-04-10 DIAGNOSIS — L97821 Non-pressure chronic ulcer of other part of left lower leg limited to breakdown of skin: Secondary | ICD-10-CM | POA: Diagnosis not present

## 2018-04-10 DIAGNOSIS — L03115 Cellulitis of right lower limb: Secondary | ICD-10-CM | POA: Diagnosis not present

## 2018-04-10 DIAGNOSIS — L97811 Non-pressure chronic ulcer of other part of right lower leg limited to breakdown of skin: Secondary | ICD-10-CM | POA: Diagnosis not present

## 2018-04-10 NOTE — Telephone Encounter (Signed)
Pt states that the cough is still the same after medication change and is no better. Pt notified that Hawi will be back on Monday and will re-start medication and await our call back then

## 2018-04-10 NOTE — Telephone Encounter (Signed)
Returned call to pt, her VM is not set up yet unable to [facility]  Medication Instructions:  HOLD LISINOPRIL FOR 2 DAYS-TO SEE IF SX STOP,  TAKE EXTRA POTASSIUM  AND INCREASE TORSEMIDE TO 30MG  DAILY FOR 2 DAYS, THEN RETURN TO REGULAR DOSING Labwork: BMET ON Thursday OR Friday HERE IN OUR OFFICE AT [facility]. Special Instructions: CALL ON Thursday AND LET us KNOW HOW COUGH IS DOING

## 2018-04-10 NOTE — Telephone Encounter (Signed)
New Message  Pt states that she was told to call back to see what kind of medication [facility] Nails wanted to put her on due to a bad cough. Please call

## 2018-04-11 DIAGNOSIS — L03115 Cellulitis of right lower limb: Secondary | ICD-10-CM | POA: Diagnosis not present

## 2018-04-11 DIAGNOSIS — L03116 Cellulitis of left lower limb: Secondary | ICD-10-CM | POA: Diagnosis not present

## 2018-04-11 DIAGNOSIS — Z79899 Other long term (current) drug therapy: Secondary | ICD-10-CM | POA: Diagnosis not present

## 2018-04-11 DIAGNOSIS — L97811 Non-pressure chronic ulcer of other part of right lower leg limited to breakdown of skin: Secondary | ICD-10-CM | POA: Diagnosis not present

## 2018-04-11 DIAGNOSIS — L97821 Non-pressure chronic ulcer of other part of left lower leg limited to breakdown of skin: Secondary | ICD-10-CM | POA: Diagnosis not present

## 2018-04-11 DIAGNOSIS — I83028 Varicose veins of left lower extremity with ulcer other part of lower leg: Secondary | ICD-10-CM | POA: Diagnosis not present

## 2018-04-11 DIAGNOSIS — I83018 Varicose veins of right lower extremity with ulcer other part of lower leg: Secondary | ICD-10-CM | POA: Diagnosis not present

## 2018-04-11 LAB — BASIC METABOLIC PANEL
BUN/Creatinine Ratio: 17 (ref 9–23)
BUN: 23 mg/dL (ref 6–24)
CO2: 24 mmol/L (ref 20–29)
Calcium: 9.2 mg/dL (ref 8.7–10.2)
Chloride: 102 mmol/L (ref 96–106)
Creatinine, Ser: 1.38 mg/dL — ABNORMAL HIGH (ref 0.57–1.00)
GFR calc Af Amer: 49 mL/min/{1.73_m2} — ABNORMAL LOW (ref >59)
GFR calc non Af Amer: 43 mL/min/{1.73_m2} — ABNORMAL LOW (ref >59)
Glucose: 109 mg/dL — ABNORMAL HIGH (ref 65–99)
Potassium: 4.6 mmol/L (ref 3.5–5.2)
Sodium: 137 mmol/L (ref 134–144)

## 2018-04-11 NOTE — Telephone Encounter (Signed)
Mailed forms to Home health.

## 2018-04-13 NOTE — Telephone Encounter (Signed)
Yes restart lisinopril as this does not appear to be the cause of cough. She is to see PCP for further evaluation.

## 2018-04-14 ENCOUNTER — Encounter: Payer: Medicare Other | Admitting: *Deleted

## 2018-04-14 ENCOUNTER — Telehealth: Payer: Self-pay | Admitting: Cardiology

## 2018-04-14 NOTE — Telephone Encounter (Signed)
Spoke with pt and reminded pt of remote transmission that is due today. Pt verbalized understanding.   

## 2018-04-15 DIAGNOSIS — L03115 Cellulitis of right lower limb: Secondary | ICD-10-CM | POA: Diagnosis not present

## 2018-04-15 DIAGNOSIS — L97821 Non-pressure chronic ulcer of other part of left lower leg limited to breakdown of skin: Secondary | ICD-10-CM | POA: Diagnosis not present

## 2018-04-15 DIAGNOSIS — I83028 Varicose veins of left lower extremity with ulcer other part of lower leg: Secondary | ICD-10-CM | POA: Diagnosis not present

## 2018-04-15 DIAGNOSIS — I83018 Varicose veins of right lower extremity with ulcer other part of lower leg: Secondary | ICD-10-CM | POA: Diagnosis not present

## 2018-04-15 DIAGNOSIS — L97811 Non-pressure chronic ulcer of other part of right lower leg limited to breakdown of skin: Secondary | ICD-10-CM | POA: Diagnosis not present

## 2018-04-15 DIAGNOSIS — L03116 Cellulitis of left lower limb: Secondary | ICD-10-CM | POA: Diagnosis not present

## 2018-04-16 ENCOUNTER — Encounter: Payer: Self-pay | Admitting: Cardiology

## 2018-04-17 DIAGNOSIS — I83018 Varicose veins of right lower extremity with ulcer other part of lower leg: Secondary | ICD-10-CM | POA: Diagnosis not present

## 2018-04-17 DIAGNOSIS — L97811 Non-pressure chronic ulcer of other part of right lower leg limited to breakdown of skin: Secondary | ICD-10-CM | POA: Diagnosis not present

## 2018-04-17 DIAGNOSIS — L03115 Cellulitis of right lower limb: Secondary | ICD-10-CM | POA: Diagnosis not present

## 2018-04-17 DIAGNOSIS — I83028 Varicose veins of left lower extremity with ulcer other part of lower leg: Secondary | ICD-10-CM | POA: Diagnosis not present

## 2018-04-17 DIAGNOSIS — L97821 Non-pressure chronic ulcer of other part of left lower leg limited to breakdown of skin: Secondary | ICD-10-CM | POA: Diagnosis not present

## 2018-04-17 DIAGNOSIS — L03116 Cellulitis of left lower limb: Secondary | ICD-10-CM | POA: Diagnosis not present

## 2018-04-18 ENCOUNTER — Ambulatory Visit: Payer: Medicare Other | Admitting: Family Medicine

## 2018-04-22 DIAGNOSIS — L03115 Cellulitis of right lower limb: Secondary | ICD-10-CM | POA: Diagnosis not present

## 2018-04-22 DIAGNOSIS — I83018 Varicose veins of right lower extremity with ulcer other part of lower leg: Secondary | ICD-10-CM | POA: Diagnosis not present

## 2018-04-22 DIAGNOSIS — L97811 Non-pressure chronic ulcer of other part of right lower leg limited to breakdown of skin: Secondary | ICD-10-CM | POA: Diagnosis not present

## 2018-04-22 DIAGNOSIS — L97821 Non-pressure chronic ulcer of other part of left lower leg limited to breakdown of skin: Secondary | ICD-10-CM | POA: Diagnosis not present

## 2018-04-22 DIAGNOSIS — L03116 Cellulitis of left lower limb: Secondary | ICD-10-CM | POA: Diagnosis not present

## 2018-04-22 DIAGNOSIS — I83028 Varicose veins of left lower extremity with ulcer other part of lower leg: Secondary | ICD-10-CM | POA: Diagnosis not present

## 2018-04-24 ENCOUNTER — Ambulatory Visit: Payer: Medicare Other | Admitting: Family Medicine

## 2018-04-24 DIAGNOSIS — L97811 Non-pressure chronic ulcer of other part of right lower leg limited to breakdown of skin: Secondary | ICD-10-CM | POA: Diagnosis not present

## 2018-04-24 DIAGNOSIS — L03115 Cellulitis of right lower limb: Secondary | ICD-10-CM | POA: Diagnosis not present

## 2018-04-24 DIAGNOSIS — L97821 Non-pressure chronic ulcer of other part of left lower leg limited to breakdown of skin: Secondary | ICD-10-CM | POA: Diagnosis not present

## 2018-04-24 DIAGNOSIS — I83028 Varicose veins of left lower extremity with ulcer other part of lower leg: Secondary | ICD-10-CM | POA: Diagnosis not present

## 2018-04-24 DIAGNOSIS — L03116 Cellulitis of left lower limb: Secondary | ICD-10-CM | POA: Diagnosis not present

## 2018-04-24 DIAGNOSIS — I83018 Varicose veins of right lower extremity with ulcer other part of lower leg: Secondary | ICD-10-CM | POA: Diagnosis not present

## 2018-04-28 ENCOUNTER — Telehealth: Payer: Self-pay | Admitting: Family Medicine

## 2018-04-28 DIAGNOSIS — I83028 Varicose veins of left lower extremity with ulcer other part of lower leg: Secondary | ICD-10-CM | POA: Diagnosis not present

## 2018-04-28 DIAGNOSIS — I83018 Varicose veins of right lower extremity with ulcer other part of lower leg: Secondary | ICD-10-CM | POA: Diagnosis not present

## 2018-04-28 DIAGNOSIS — L97821 Non-pressure chronic ulcer of other part of left lower leg limited to breakdown of skin: Secondary | ICD-10-CM | POA: Diagnosis not present

## 2018-04-28 DIAGNOSIS — L03116 Cellulitis of left lower limb: Secondary | ICD-10-CM | POA: Diagnosis not present

## 2018-04-28 DIAGNOSIS — L97811 Non-pressure chronic ulcer of other part of right lower leg limited to breakdown of skin: Secondary | ICD-10-CM | POA: Diagnosis not present

## 2018-04-28 DIAGNOSIS — L03115 Cellulitis of right lower limb: Secondary | ICD-10-CM | POA: Diagnosis not present

## 2018-04-28 NOTE — Telephone Encounter (Signed)
Copied from [place]. Topic: General - Other >> Apr 28, 2018  1:57 PM Yvette Rack wrote: Reason for CRM: Tillie Rung PT [phone] from [facility] home health calling for orders for PT extended twice a week for 4 weeks and once a week for 2 weeks

## 2018-04-28 NOTE — Telephone Encounter (Signed)
Spoke with Tillie Rung approved pt

## 2018-04-30 DIAGNOSIS — I255 Ischemic cardiomyopathy: Secondary | ICD-10-CM | POA: Diagnosis not present

## 2018-04-30 DIAGNOSIS — L97821 Non-pressure chronic ulcer of other part of left lower leg limited to breakdown of skin: Secondary | ICD-10-CM | POA: Diagnosis not present

## 2018-04-30 DIAGNOSIS — F329 Major depressive disorder, single episode, unspecified: Secondary | ICD-10-CM | POA: Diagnosis not present

## 2018-04-30 DIAGNOSIS — I4901 Ventricular fibrillation: Secondary | ICD-10-CM | POA: Diagnosis not present

## 2018-04-30 DIAGNOSIS — N184 Chronic kidney disease, stage 4 (severe): Secondary | ICD-10-CM | POA: Diagnosis not present

## 2018-04-30 DIAGNOSIS — I130 Hypertensive heart and chronic kidney disease with heart failure and stage 1 through stage 4 chronic kidney disease, or unspecified chronic kidney disease: Secondary | ICD-10-CM | POA: Diagnosis not present

## 2018-04-30 DIAGNOSIS — I83018 Varicose veins of right lower extremity with ulcer other part of lower leg: Secondary | ICD-10-CM | POA: Diagnosis not present

## 2018-04-30 DIAGNOSIS — Z7982 Long term (current) use of aspirin: Secondary | ICD-10-CM | POA: Diagnosis not present

## 2018-04-30 DIAGNOSIS — L03115 Cellulitis of right lower limb: Secondary | ICD-10-CM | POA: Diagnosis not present

## 2018-04-30 DIAGNOSIS — I5040 Unspecified combined systolic (congestive) and diastolic (congestive) heart failure: Secondary | ICD-10-CM | POA: Diagnosis not present

## 2018-04-30 DIAGNOSIS — L97811 Non-pressure chronic ulcer of other part of right lower leg limited to breakdown of skin: Secondary | ICD-10-CM | POA: Diagnosis not present

## 2018-04-30 DIAGNOSIS — Z9181 History of falling: Secondary | ICD-10-CM | POA: Diagnosis not present

## 2018-04-30 DIAGNOSIS — I051 Rheumatic mitral insufficiency: Secondary | ICD-10-CM | POA: Diagnosis not present

## 2018-04-30 DIAGNOSIS — M6281 Muscle weakness (generalized): Secondary | ICD-10-CM | POA: Diagnosis not present

## 2018-04-30 DIAGNOSIS — L03116 Cellulitis of left lower limb: Secondary | ICD-10-CM | POA: Diagnosis not present

## 2018-04-30 DIAGNOSIS — I83028 Varicose veins of left lower extremity with ulcer other part of lower leg: Secondary | ICD-10-CM | POA: Diagnosis not present

## 2018-04-30 DIAGNOSIS — J45909 Unspecified asthma, uncomplicated: Secondary | ICD-10-CM | POA: Diagnosis not present

## 2018-04-30 DIAGNOSIS — I70209 Unspecified atherosclerosis of native arteries of extremities, unspecified extremity: Secondary | ICD-10-CM | POA: Diagnosis not present

## 2018-04-30 DIAGNOSIS — Z6841 Body Mass Index (BMI) 40.0 and over, adult: Secondary | ICD-10-CM | POA: Diagnosis not present

## 2018-04-30 DIAGNOSIS — D631 Anemia in chronic kidney disease: Secondary | ICD-10-CM | POA: Diagnosis not present

## 2018-05-02 ENCOUNTER — Telehealth: Payer: Self-pay | Admitting: Family Medicine

## 2018-05-02 NOTE — Telephone Encounter (Signed)
Copied from [place]. Topic: Quick Communication - See Telephone Encounter >> May 02, 2018  3:50 PM Robina Ade, Helene Kelp D wrote: CRM for notification. See Telephone encounter for: 05/02/18. Mekela with [facility] called requesting verbal order to continue for wound car. If there are any question she can be reached at [phone] and if she doesn't pick up can leave vm for the ok to continue wound care.

## 2018-05-05 DIAGNOSIS — L97821 Non-pressure chronic ulcer of other part of left lower leg limited to breakdown of skin: Secondary | ICD-10-CM | POA: Diagnosis not present

## 2018-05-05 DIAGNOSIS — I83018 Varicose veins of right lower extremity with ulcer other part of lower leg: Secondary | ICD-10-CM | POA: Diagnosis not present

## 2018-05-05 DIAGNOSIS — L97811 Non-pressure chronic ulcer of other part of right lower leg limited to breakdown of skin: Secondary | ICD-10-CM | POA: Diagnosis not present

## 2018-05-05 DIAGNOSIS — I83028 Varicose veins of left lower extremity with ulcer other part of lower leg: Secondary | ICD-10-CM | POA: Diagnosis not present

## 2018-05-05 DIAGNOSIS — M6281 Muscle weakness (generalized): Secondary | ICD-10-CM | POA: Diagnosis not present

## 2018-05-05 DIAGNOSIS — L03115 Cellulitis of right lower limb: Secondary | ICD-10-CM | POA: Diagnosis not present

## 2018-05-05 NOTE — Telephone Encounter (Signed)
Left message ok to continue wound care

## 2018-05-06 DIAGNOSIS — L03115 Cellulitis of right lower limb: Secondary | ICD-10-CM | POA: Diagnosis not present

## 2018-05-06 DIAGNOSIS — L97811 Non-pressure chronic ulcer of other part of right lower leg limited to breakdown of skin: Secondary | ICD-10-CM | POA: Diagnosis not present

## 2018-05-06 DIAGNOSIS — I83028 Varicose veins of left lower extremity with ulcer other part of lower leg: Secondary | ICD-10-CM | POA: Diagnosis not present

## 2018-05-06 DIAGNOSIS — I83018 Varicose veins of right lower extremity with ulcer other part of lower leg: Secondary | ICD-10-CM | POA: Diagnosis not present

## 2018-05-06 DIAGNOSIS — L97821 Non-pressure chronic ulcer of other part of left lower leg limited to breakdown of skin: Secondary | ICD-10-CM | POA: Diagnosis not present

## 2018-05-06 DIAGNOSIS — M6281 Muscle weakness (generalized): Secondary | ICD-10-CM | POA: Diagnosis not present

## 2018-05-07 ENCOUNTER — Telehealth: Payer: Self-pay | Admitting: Family Medicine

## 2018-05-07 ENCOUNTER — Other Ambulatory Visit: Payer: Self-pay

## 2018-05-07 DIAGNOSIS — L03115 Cellulitis of right lower limb: Secondary | ICD-10-CM | POA: Diagnosis not present

## 2018-05-07 DIAGNOSIS — I83028 Varicose veins of left lower extremity with ulcer other part of lower leg: Secondary | ICD-10-CM | POA: Diagnosis not present

## 2018-05-07 DIAGNOSIS — L97811 Non-pressure chronic ulcer of other part of right lower leg limited to breakdown of skin: Secondary | ICD-10-CM | POA: Diagnosis not present

## 2018-05-07 DIAGNOSIS — I83018 Varicose veins of right lower extremity with ulcer other part of lower leg: Secondary | ICD-10-CM | POA: Diagnosis not present

## 2018-05-07 DIAGNOSIS — M6281 Muscle weakness (generalized): Secondary | ICD-10-CM | POA: Diagnosis not present

## 2018-05-07 DIAGNOSIS — L97821 Non-pressure chronic ulcer of other part of left lower leg limited to breakdown of skin: Secondary | ICD-10-CM | POA: Diagnosis not present

## 2018-05-07 NOTE — Telephone Encounter (Signed)
Rx refill request: hydrocodone- acetaminophen 5-325 mg   Last filled: 04/08/18 # 20  LOV: 03/21/18  PCP: [place]: [facility]

## 2018-05-07 NOTE — Telephone Encounter (Signed)
Copied from [place] [id number]. Topic: Quick Communication - Rx Refill/Question >> May 07, 2018 12:00 PM Wynetta Emery, [place] C wrote: Medication: HYDROcodone-acetaminophen (NORCO/VICODIN) 5-325 MG tablet  Has the patient contacted their pharmacy? Yes   (Agent: If no, request that the patient contact the pharmacy for the refill.)  Preferred Pharmacy (with phone number or street name): [place], [place]. [phone] (Phone) [phone] (Fax)     Agent: Please be advised that RX refills may take up to 3 business days. We ask that you follow-up with your pharmacy.

## 2018-05-08 DIAGNOSIS — I83028 Varicose veins of left lower extremity with ulcer other part of lower leg: Secondary | ICD-10-CM | POA: Diagnosis not present

## 2018-05-08 DIAGNOSIS — L97811 Non-pressure chronic ulcer of other part of right lower leg limited to breakdown of skin: Secondary | ICD-10-CM | POA: Diagnosis not present

## 2018-05-08 DIAGNOSIS — M6281 Muscle weakness (generalized): Secondary | ICD-10-CM | POA: Diagnosis not present

## 2018-05-08 DIAGNOSIS — L97821 Non-pressure chronic ulcer of other part of left lower leg limited to breakdown of skin: Secondary | ICD-10-CM | POA: Diagnosis not present

## 2018-05-08 DIAGNOSIS — L03115 Cellulitis of right lower limb: Secondary | ICD-10-CM | POA: Diagnosis not present

## 2018-05-08 DIAGNOSIS — I83018 Varicose veins of right lower extremity with ulcer other part of lower leg: Secondary | ICD-10-CM | POA: Diagnosis not present

## 2018-05-08 NOTE — Telephone Encounter (Signed)
Patient called back checking on the status of this refill. Please advise.

## 2018-05-09 NOTE — Telephone Encounter (Signed)
Pt is calling back checking on the status

## 2018-05-09 NOTE — Telephone Encounter (Signed)
Pt advised to schedule f/u visit.

## 2018-05-10 ENCOUNTER — Ambulatory Visit (INDEPENDENT_AMBULATORY_CARE_PROVIDER_SITE_OTHER): Payer: Medicare Other | Admitting: Family Medicine

## 2018-05-10 ENCOUNTER — Encounter: Payer: Self-pay | Admitting: Family Medicine

## 2018-05-10 ENCOUNTER — Other Ambulatory Visit: Payer: Self-pay

## 2018-05-10 VITALS — BP 121/73 | HR 83 | Temp 98.7°F | Resp 16 | Ht 67.5 in | Wt 300.6 lb

## 2018-05-10 DIAGNOSIS — L97209 Non-pressure chronic ulcer of unspecified calf with unspecified severity: Secondary | ICD-10-CM

## 2018-05-10 DIAGNOSIS — I83002 Varicose veins of unspecified lower extremity with ulcer of calf: Secondary | ICD-10-CM

## 2018-05-10 DIAGNOSIS — D649 Anemia, unspecified: Secondary | ICD-10-CM | POA: Diagnosis not present

## 2018-05-10 DIAGNOSIS — N183 Chronic kidney disease, stage 3 unspecified: Secondary | ICD-10-CM

## 2018-05-10 DIAGNOSIS — I5042 Chronic combined systolic (congestive) and diastolic (congestive) heart failure: Secondary | ICD-10-CM | POA: Diagnosis not present

## 2018-05-10 DIAGNOSIS — Z9181 History of falling: Secondary | ICD-10-CM | POA: Insufficient documentation

## 2018-05-10 MED ORDER — HYDROCODONE-ACETAMINOPHEN 5-325 MG PO TABS: 1.0000 | ORAL_TABLET | Freq: Four times a day (QID) | ORAL | 0 refills | Status: DC | PRN

## 2018-05-10 MED ORDER — TORSEMIDE 20 MG PO TABS: 20.0000 mg | ORAL_TABLET | Freq: Every day | ORAL | 1 refills | Status: DC

## 2018-05-10 NOTE — Progress Notes (Signed)
5/18/201912:00 PM  [place] 09/01/1961, 57 y.o. female [id number]  Chief Complaint  Patient presents with  . Leg Pain    x 1 week    HPI:   Patient is a 57 y.o. female with past medical history significant for s/p cardiac arrest in 2018 now with ACID, CHF, CKD, venous stasis ulcers who presents today for follow-up  She continues to receive home health care wound care for bilateral venous stasis ulcers. They continue with santyl and compression ace wraps.  Patient reports that she thought she was healing better when they were covering her ulcers with xeroform. She has a new Higher education careers adviser. She has also started doing PT, they are working on strength and stability. She is very happy with this. She reports feeling better. She does use a cane. She is also requesting refill of vicodin, she takes prn. [place] CSR reviewed. Last rx 03/21/18 #20 tabs  Saw cards 04/07/18, was fluid up, temporary increase in torsemide. Held lisinopril but cough not improved so now back on it.  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 Yes Yes No No No  Number falls in past yr: 2 or more 1 - - -  Comment - patient stepped into hole outside apartment - - -  Injury with Fall? No Yes - - -  Comment - soft tissue injury resolved without intervention - - -  Risk for fall due to : - History of fall(s);Impaired mobility - - -  Risk for fall due to: Comment - - - - -  Follow up - Falls prevention discussed - - -     Depression screen [facility] 2/9 05/10/2018 03/24/2018 03/21/2018  Decreased Interest 0 0 0  Down, Depressed, Hopeless 0 0 0  PHQ - 2 Score 0 0 0  Altered  sleeping - - -  Tired, decreased energy - - -  Change in appetite - - -  Feeling bad or failure about yourself  - - -  Trouble concentrating - - -  Moving slowly or fidgety/restless - - -  Suicidal thoughts - - -  PHQ-9 Score - - -  Difficult doing work/chores - - -    Allergies  Allergen Reactions  . Tramadol     hallucination    Prior to Admission medications   Medication Sig Start Date End Date Taking? Authorizing Provider  albuterol (PROVENTIL HFA;VENTOLIN HFA) 108 (90 Base) MCG/ACT inhaler Inhale 2 puffs into the lungs every 6 (six) hours as needed for shortness of breath. 07/06/17  Yes Mikhail, Velta Addison, DO  aspirin EC 81 MG tablet Take 81 mg by mouth daily.   Yes [provider]  carvedilol (COREG) 12.5 MG tablet Take 1 tablet (12.5 mg total) by mouth 2 (two) times daily with a meal. 12/26/17  Yes Strader, [place] M, PA-C  famotidine (PEPCID) 20 MG tablet Take 1 tablet (20 mg total) by mouth daily. 02/27/18  Yes Shelly Coss, MD  furosemide (LASIX) 40 MG tablet Take 40 mg by mouth.   Yes [provider]  HYDROcodone-acetaminophen (NORCO/VICODIN) 5-325 MG tablet Take 1 tablet by mouth every 6 (six) hours as  needed for moderate pain. 04/08/18  Yes Rutherford Guys, MD  loratadine (CLARITIN) 10 MG tablet Take 10 mg by mouth daily.   Yes [provider]  ondansetron (ZOFRAN) 4 MG tablet Take 1 tablet (4 mg total) by mouth every 6 (six) hours as needed for nausea. 04/06/17  Yes Sinda Du, MD  collagenase (SANTYL) ointment Apply 1 application topically as needed (for wound care). Apply to wound BLE per Tx 03/21/18   Rutherford Guys, MD  lisinopril (PRINIVIL,ZESTRIL) 2.5 MG tablet Take 1 tablet (2.5 mg total) by mouth daily. 12/26/17 03/26/18  Strader, Fransisco Hertz, PA-C  potassium chloride SA (KLOR-CON M20) 20 MEQ tablet Take 1 tablet (20 mEq total) by mouth daily for 14 days. 02/26/18 03/12/18  Shelly Coss, MD  torsemide (DEMADEX) 20 MG tablet Take 1 tablet (20  mg total) by mouth daily. 12/26/17 03/26/18  Erma Heritage, PA-C    Past Medical History:  Diagnosis Date  . Asthma   . CHF (congestive heart failure) ([place]) 03/12/2014   a. EF 40-45% by echo in 06/2017 with normal cors by cath. ICD placed following VT arrest  . Depression   . Headache(784.0)   . Hyperlipidemia   . Hypertension   . Lung nodules   . Renal disorder   . Sarcoidosis   . Ulcer    recurring, from chronic venous insufficiency    Past Surgical History:  Procedure Laterality Date  . cataract surgery Left 07-2013  . ICD IMPLANT N/A 07/05/2017   Procedure: ICD Implant;  Surgeon: Constance Haw, MD;  Location: [place] [facility];  Service: Cardiovascular;  Laterality: N/A;  . LEFT HEART CATH AND CORONARY ANGIOGRAPHY N/A 07/03/2017   Procedure: Left Heart Cath and Coronary Angiography;  Surgeon: Belva Crome, MD;  Location: [place] [facility];  Service: Cardiovascular;  Laterality: N/A;    Social History   Tobacco Use  . Smoking status: Never Smoker  . Smokeless tobacco: Never Used  Substance Use Topics  . Alcohol use: No    Family History  Problem Relation Age of Onset  . Heart failure Mother   . Diabetes Mellitus II Mother   . Hypertension Mother   . Cancer Mother        unknown type  . Heart disease Father   . Stroke Father   . Diabetes Mellitus II Father     Review of Systems  Constitutional: Negative for chills and fever.  Respiratory: Negative for cough and shortness of breath.   Cardiovascular: Positive for leg swelling. Negative for chest pain and palpitations.  Gastrointestinal: Negative for abdominal pain, nausea and vomiting.     OBJECTIVE:  Blood pressure 121/73, pulse 83, temperature 98.7 F (37.1 C), temperature source Oral, resp. rate 16, height 5' 7.5" (1.715 m), weight (!) 300 lb 9.6 oz (136.4 kg), last menstrual period 11/21/2011, SpO2 96 %.  Wt Readings from Last 3 Encounters:  05/10/18 (!) 300 lb 9.6 oz (136.4 kg)    04/07/18 (!) 303 lb 9.6 oz (137.7 kg)  03/21/18 292 lb 3.2 oz (132.5 kg)  weight 17 lbs up compared to 02/24/2018  Physical Exam  Physical Exam  Constitutional: She is oriented to person, place, and time and well-developed, well-nourished, and in no distress.  HENT:  Head: Normocephalic and atraumatic.  Mouth/Throat: Oropharynx is clear and moist. No oropharyngeal exudate.  Eyes: Pupils are equal, round, and reactive to light. EOM are normal. No scleral icterus.  Neck: Neck supple. No JVD present.  Cardiovascular: Normal rate, regular rhythm and normal heart sounds. Exam reveals no gallop and no friction rub.  No murmur heard. Pulmonary/Chest: Effort normal and breath sounds normal. She has no wheezes. She has no rales.  Musculoskeletal:  Boths legs wrapped in c/d/i bandages. Ankles and thighs without any edema  Neurological: She is alert and oriented to person, place, and time. Gait normal.  Skin: Skin is warm and dry.    ASSESSMENT and PLAN  1. Venous stasis ulcer of calf, unspecified laterality, unspecified ulcer stage, unspecified whether varicose veins present [facility]) Patient just had dressing changes earlier today. I will speak with [facility] RN and request she sends me pictures during next dressing changes. Will discuss with her current wound care. Consider referral to outpatient wound care. Refilling vicodin, discussed r/se/b.  2. Chronic combined systolic and diastolic congestive heart failure ([place]) Managed by cards. Recent changes to diuretics.   3. Stage 3 chronic kidney disease ([place]) Requesting records. Checking labs as unable to access labs done by renal.  - Comprehensive metabolic panel  4. Anemia, unspecified type - CBC - Iron, TIBC and Ferritin Panel  Other orders - torsemide (DEMADEX) 20 MG tablet; Take 1 tablet (20 mg total) by mouth daily. - HYDROcodone-acetaminophen (NORCO/VICODIN) 5-325 MG tablet; Take 1 tablet by mouth every 6 (six) hours as needed for moderate  pain.  Return in about 3 months (around 08/10/2018).    Rutherford Guys, MD Primary Care at [place], [place] [address] Ph.  [phone] Fax [phone]

## 2018-05-10 NOTE — Patient Instructions (Signed)
     IF you received an x-ray today, you will receive an invoice from [facility]. Please contact [facility] at [phone] with questions or concerns regarding your invoice.   IF you received labwork today, you will receive an invoice from [facility]. Please contact [facility] at [phone] with questions or concerns regarding your invoice.   Our billing staff will not be able to assist you with questions regarding bills from these companies.  You will be contacted with the lab results as soon as they are available. The fastest way to get your results is to activate your My Chart account. Instructions are located on the last page of this paperwork. If you have not heard from us regarding the results in 2 weeks, please contact this office.     

## 2018-05-11 LAB — COMPREHENSIVE METABOLIC PANEL
ALT: 10 IU/L (ref 0–32)
AST: 11 IU/L (ref 0–40)
Albumin/Globulin Ratio: 0.6 — ABNORMAL LOW (ref 1.2–2.2)
Albumin: 3.3 g/dL — ABNORMAL LOW (ref 3.5–5.5)
Alkaline Phosphatase: 88 IU/L (ref 39–117)
BUN/Creatinine Ratio: 13 (ref 9–23)
BUN: 16 mg/dL (ref 6–24)
Bilirubin Total: 0.3 mg/dL (ref 0.0–1.2)
CO2: 22 mmol/L (ref 20–29)
Calcium: 9.3 mg/dL (ref 8.7–10.2)
Chloride: 104 mmol/L (ref 96–106)
Creatinine, Ser: 1.25 mg/dL — ABNORMAL HIGH (ref 0.57–1.00)
GFR calc Af Amer: 56 mL/min/{1.73_m2} — ABNORMAL LOW (ref >59)
GFR calc non Af Amer: 48 mL/min/{1.73_m2} — ABNORMAL LOW (ref >59)
Globulin, Total: 5.2 g/dL — ABNORMAL HIGH (ref 1.5–4.5)
Glucose: 96 mg/dL (ref 65–99)
Potassium: 4.5 mmol/L (ref 3.5–5.2)
Sodium: 139 mmol/L (ref 134–144)
Total Protein: 8.5 g/dL (ref 6.0–8.5)

## 2018-05-11 LAB — CBC
Hematocrit: 29.7 % — ABNORMAL LOW (ref 34.0–46.6)
Hemoglobin: 9.2 g/dL — ABNORMAL LOW (ref 11.1–15.9)
MCH: 27.7 pg (ref 26.6–33.0)
MCHC: 31 g/dL — ABNORMAL LOW (ref 31.5–35.7)
MCV: 90 fL (ref 79–97)
Platelets: 397 10*3/uL — ABNORMAL HIGH (ref 150–379)
RBC: 3.32 x10E6/uL — ABNORMAL LOW (ref 3.77–5.28)
RDW: 15 % (ref 12.3–15.4)
WBC: 9 10*3/uL (ref 3.4–10.8)

## 2018-05-11 LAB — IRON,TIBC AND FERRITIN PANEL
Ferritin: 245 ng/mL — ABNORMAL HIGH (ref 15–150)
Iron Saturation: 13 % — ABNORMAL LOW (ref 15–55)
Iron: 28 ug/dL (ref 27–159)
Total Iron Binding Capacity: 216 ug/dL — ABNORMAL LOW (ref 250–450)
UIBC: 188 ug/dL (ref 131–425)

## 2018-05-13 DIAGNOSIS — I83018 Varicose veins of right lower extremity with ulcer other part of lower leg: Secondary | ICD-10-CM | POA: Diagnosis not present

## 2018-05-13 DIAGNOSIS — L03115 Cellulitis of right lower limb: Secondary | ICD-10-CM | POA: Diagnosis not present

## 2018-05-13 DIAGNOSIS — I83028 Varicose veins of left lower extremity with ulcer other part of lower leg: Secondary | ICD-10-CM | POA: Diagnosis not present

## 2018-05-13 DIAGNOSIS — M6281 Muscle weakness (generalized): Secondary | ICD-10-CM | POA: Diagnosis not present

## 2018-05-13 DIAGNOSIS — L97821 Non-pressure chronic ulcer of other part of left lower leg limited to breakdown of skin: Secondary | ICD-10-CM | POA: Diagnosis not present

## 2018-05-13 DIAGNOSIS — L97811 Non-pressure chronic ulcer of other part of right lower leg limited to breakdown of skin: Secondary | ICD-10-CM | POA: Diagnosis not present

## 2018-05-14 DIAGNOSIS — I83018 Varicose veins of right lower extremity with ulcer other part of lower leg: Secondary | ICD-10-CM | POA: Diagnosis not present

## 2018-05-14 DIAGNOSIS — L03115 Cellulitis of right lower limb: Secondary | ICD-10-CM | POA: Diagnosis not present

## 2018-05-14 DIAGNOSIS — I83028 Varicose veins of left lower extremity with ulcer other part of lower leg: Secondary | ICD-10-CM | POA: Diagnosis not present

## 2018-05-14 DIAGNOSIS — M6281 Muscle weakness (generalized): Secondary | ICD-10-CM | POA: Diagnosis not present

## 2018-05-14 DIAGNOSIS — L97811 Non-pressure chronic ulcer of other part of right lower leg limited to breakdown of skin: Secondary | ICD-10-CM | POA: Diagnosis not present

## 2018-05-14 DIAGNOSIS — L97821 Non-pressure chronic ulcer of other part of left lower leg limited to breakdown of skin: Secondary | ICD-10-CM | POA: Diagnosis not present

## 2018-05-16 DIAGNOSIS — I83028 Varicose veins of left lower extremity with ulcer other part of lower leg: Secondary | ICD-10-CM | POA: Diagnosis not present

## 2018-05-16 DIAGNOSIS — L97811 Non-pressure chronic ulcer of other part of right lower leg limited to breakdown of skin: Secondary | ICD-10-CM | POA: Diagnosis not present

## 2018-05-16 DIAGNOSIS — L97821 Non-pressure chronic ulcer of other part of left lower leg limited to breakdown of skin: Secondary | ICD-10-CM | POA: Diagnosis not present

## 2018-05-16 DIAGNOSIS — M6281 Muscle weakness (generalized): Secondary | ICD-10-CM | POA: Diagnosis not present

## 2018-05-16 DIAGNOSIS — L03115 Cellulitis of right lower limb: Secondary | ICD-10-CM | POA: Diagnosis not present

## 2018-05-16 DIAGNOSIS — I83018 Varicose veins of right lower extremity with ulcer other part of lower leg: Secondary | ICD-10-CM | POA: Diagnosis not present

## 2018-05-20 ENCOUNTER — Telehealth: Payer: Self-pay | Admitting: Family Medicine

## 2018-05-20 DIAGNOSIS — I83002 Varicose veins of unspecified lower extremity with ulcer of calf: Secondary | ICD-10-CM

## 2018-05-20 DIAGNOSIS — L03115 Cellulitis of right lower limb: Secondary | ICD-10-CM | POA: Diagnosis not present

## 2018-05-20 DIAGNOSIS — L97821 Non-pressure chronic ulcer of other part of left lower leg limited to breakdown of skin: Secondary | ICD-10-CM | POA: Diagnosis not present

## 2018-05-20 DIAGNOSIS — I83018 Varicose veins of right lower extremity with ulcer other part of lower leg: Secondary | ICD-10-CM | POA: Diagnosis not present

## 2018-05-20 DIAGNOSIS — I83028 Varicose veins of left lower extremity with ulcer other part of lower leg: Secondary | ICD-10-CM | POA: Diagnosis not present

## 2018-05-20 DIAGNOSIS — L97209 Non-pressure chronic ulcer of unspecified calf with unspecified severity: Secondary | ICD-10-CM

## 2018-05-20 DIAGNOSIS — L97811 Non-pressure chronic ulcer of other part of right lower leg limited to breakdown of skin: Secondary | ICD-10-CM | POA: Diagnosis not present

## 2018-05-20 DIAGNOSIS — M6281 Muscle weakness (generalized): Secondary | ICD-10-CM | POA: Diagnosis not present

## 2018-05-20 NOTE — Telephone Encounter (Signed)
Please advise 

## 2018-05-20 NOTE — Telephone Encounter (Signed)
Copied from [place] [id number]. Topic: Quick Communication - See Telephone Encounter >> May 20, 2018  9:31 AM Conception Chancy, NT wrote: [place] is a RN calling from [facility] home health, she states that she spoke with Dr. [name] last week and sent pictures of the patients wounds. She states that she does not have enough supplies to change the patient wounds because the order states 2x a week. She states [facility] changes them 2x a week and patient changes them the rest of the time and she will need the order to state that so that the patient will have enough supplies to change the wounds.   [place] [phone]

## 2018-05-20 NOTE — Telephone Encounter (Signed)
Please give verbal order for daily dressing changes. I also spoke with patient, referring to outpatient wound care. thanks

## 2018-05-21 DIAGNOSIS — M6281 Muscle weakness (generalized): Secondary | ICD-10-CM | POA: Diagnosis not present

## 2018-05-21 DIAGNOSIS — L97811 Non-pressure chronic ulcer of other part of right lower leg limited to breakdown of skin: Secondary | ICD-10-CM | POA: Diagnosis not present

## 2018-05-21 DIAGNOSIS — I83028 Varicose veins of left lower extremity with ulcer other part of lower leg: Secondary | ICD-10-CM | POA: Diagnosis not present

## 2018-05-21 DIAGNOSIS — I83018 Varicose veins of right lower extremity with ulcer other part of lower leg: Secondary | ICD-10-CM | POA: Diagnosis not present

## 2018-05-21 DIAGNOSIS — L03115 Cellulitis of right lower limb: Secondary | ICD-10-CM | POA: Diagnosis not present

## 2018-05-21 DIAGNOSIS — L97821 Non-pressure chronic ulcer of other part of left lower leg limited to breakdown of skin: Secondary | ICD-10-CM | POA: Diagnosis not present

## 2018-05-21 NOTE — Telephone Encounter (Signed)
Phone call to [place], advised of note below. She requests note from Dr. [name] be faxed to [phone]. Note faxed per Brittany's request.

## 2018-05-23 DIAGNOSIS — M6281 Muscle weakness (generalized): Secondary | ICD-10-CM | POA: Diagnosis not present

## 2018-05-23 DIAGNOSIS — L97821 Non-pressure chronic ulcer of other part of left lower leg limited to breakdown of skin: Secondary | ICD-10-CM | POA: Diagnosis not present

## 2018-05-23 DIAGNOSIS — L03115 Cellulitis of right lower limb: Secondary | ICD-10-CM | POA: Diagnosis not present

## 2018-05-23 DIAGNOSIS — L97811 Non-pressure chronic ulcer of other part of right lower leg limited to breakdown of skin: Secondary | ICD-10-CM | POA: Diagnosis not present

## 2018-05-23 DIAGNOSIS — I83018 Varicose veins of right lower extremity with ulcer other part of lower leg: Secondary | ICD-10-CM | POA: Diagnosis not present

## 2018-05-23 DIAGNOSIS — I83028 Varicose veins of left lower extremity with ulcer other part of lower leg: Secondary | ICD-10-CM | POA: Diagnosis not present

## 2018-05-26 DIAGNOSIS — L97811 Non-pressure chronic ulcer of other part of right lower leg limited to breakdown of skin: Secondary | ICD-10-CM | POA: Diagnosis not present

## 2018-05-26 DIAGNOSIS — M6281 Muscle weakness (generalized): Secondary | ICD-10-CM | POA: Diagnosis not present

## 2018-05-26 DIAGNOSIS — L03115 Cellulitis of right lower limb: Secondary | ICD-10-CM | POA: Diagnosis not present

## 2018-05-26 DIAGNOSIS — L97821 Non-pressure chronic ulcer of other part of left lower leg limited to breakdown of skin: Secondary | ICD-10-CM | POA: Diagnosis not present

## 2018-05-26 DIAGNOSIS — I83018 Varicose veins of right lower extremity with ulcer other part of lower leg: Secondary | ICD-10-CM | POA: Diagnosis not present

## 2018-05-26 DIAGNOSIS — I83028 Varicose veins of left lower extremity with ulcer other part of lower leg: Secondary | ICD-10-CM | POA: Diagnosis not present

## 2018-05-30 DIAGNOSIS — L97821 Non-pressure chronic ulcer of other part of left lower leg limited to breakdown of skin: Secondary | ICD-10-CM | POA: Diagnosis not present

## 2018-05-30 DIAGNOSIS — L97811 Non-pressure chronic ulcer of other part of right lower leg limited to breakdown of skin: Secondary | ICD-10-CM | POA: Diagnosis not present

## 2018-05-30 DIAGNOSIS — L03115 Cellulitis of right lower limb: Secondary | ICD-10-CM | POA: Diagnosis not present

## 2018-05-30 DIAGNOSIS — I83028 Varicose veins of left lower extremity with ulcer other part of lower leg: Secondary | ICD-10-CM | POA: Diagnosis not present

## 2018-05-30 DIAGNOSIS — I83018 Varicose veins of right lower extremity with ulcer other part of lower leg: Secondary | ICD-10-CM | POA: Diagnosis not present

## 2018-05-30 DIAGNOSIS — M6281 Muscle weakness (generalized): Secondary | ICD-10-CM | POA: Diagnosis not present

## 2018-06-02 DIAGNOSIS — L97811 Non-pressure chronic ulcer of other part of right lower leg limited to breakdown of skin: Secondary | ICD-10-CM | POA: Diagnosis not present

## 2018-06-02 DIAGNOSIS — L03115 Cellulitis of right lower limb: Secondary | ICD-10-CM | POA: Diagnosis not present

## 2018-06-02 DIAGNOSIS — I83028 Varicose veins of left lower extremity with ulcer other part of lower leg: Secondary | ICD-10-CM | POA: Diagnosis not present

## 2018-06-02 DIAGNOSIS — M6281 Muscle weakness (generalized): Secondary | ICD-10-CM | POA: Diagnosis not present

## 2018-06-02 DIAGNOSIS — I83018 Varicose veins of right lower extremity with ulcer other part of lower leg: Secondary | ICD-10-CM | POA: Diagnosis not present

## 2018-06-02 DIAGNOSIS — L97821 Non-pressure chronic ulcer of other part of left lower leg limited to breakdown of skin: Secondary | ICD-10-CM | POA: Diagnosis not present

## 2018-06-03 DIAGNOSIS — L97821 Non-pressure chronic ulcer of other part of left lower leg limited to breakdown of skin: Secondary | ICD-10-CM | POA: Diagnosis not present

## 2018-06-03 DIAGNOSIS — L03115 Cellulitis of right lower limb: Secondary | ICD-10-CM | POA: Diagnosis not present

## 2018-06-03 DIAGNOSIS — L97811 Non-pressure chronic ulcer of other part of right lower leg limited to breakdown of skin: Secondary | ICD-10-CM | POA: Diagnosis not present

## 2018-06-03 DIAGNOSIS — M6281 Muscle weakness (generalized): Secondary | ICD-10-CM | POA: Diagnosis not present

## 2018-06-03 DIAGNOSIS — I83028 Varicose veins of left lower extremity with ulcer other part of lower leg: Secondary | ICD-10-CM | POA: Diagnosis not present

## 2018-06-03 DIAGNOSIS — I83018 Varicose veins of right lower extremity with ulcer other part of lower leg: Secondary | ICD-10-CM | POA: Diagnosis not present

## 2018-06-04 ENCOUNTER — Encounter: Payer: Self-pay | Admitting: Cardiology

## 2018-06-05 ENCOUNTER — Telehealth: Payer: Self-pay

## 2018-06-05 NOTE — Telephone Encounter (Signed)
Copied from [place] [id number]. Topic: Quick Communication - See Telephone Encounter >> Jun 05, 2018  2:54 PM Antonieta Iba C wrote: CRM for notification. See Telephone encounter for: 06/05/18.   [facility] called in to check the status of the orders that were faxed over, they need them signed and returned.  CB: [phone] ext [phone]   Message sent to Dr. [name]

## 2018-06-06 ENCOUNTER — Telehealth: Payer: Self-pay

## 2018-06-06 DIAGNOSIS — L97821 Non-pressure chronic ulcer of other part of left lower leg limited to breakdown of skin: Secondary | ICD-10-CM | POA: Diagnosis not present

## 2018-06-06 DIAGNOSIS — M6281 Muscle weakness (generalized): Secondary | ICD-10-CM | POA: Diagnosis not present

## 2018-06-06 DIAGNOSIS — L97811 Non-pressure chronic ulcer of other part of right lower leg limited to breakdown of skin: Secondary | ICD-10-CM | POA: Diagnosis not present

## 2018-06-06 DIAGNOSIS — I83028 Varicose veins of left lower extremity with ulcer other part of lower leg: Secondary | ICD-10-CM | POA: Diagnosis not present

## 2018-06-06 DIAGNOSIS — L03115 Cellulitis of right lower limb: Secondary | ICD-10-CM | POA: Diagnosis not present

## 2018-06-06 DIAGNOSIS — I83018 Varicose veins of right lower extremity with ulcer other part of lower leg: Secondary | ICD-10-CM | POA: Diagnosis not present

## 2018-06-06 NOTE — Telephone Encounter (Signed)
Orders faxed to [facility] [phone]

## 2018-06-10 DIAGNOSIS — L97811 Non-pressure chronic ulcer of other part of right lower leg limited to breakdown of skin: Secondary | ICD-10-CM | POA: Diagnosis not present

## 2018-06-10 DIAGNOSIS — I83018 Varicose veins of right lower extremity with ulcer other part of lower leg: Secondary | ICD-10-CM | POA: Diagnosis not present

## 2018-06-10 DIAGNOSIS — I83028 Varicose veins of left lower extremity with ulcer other part of lower leg: Secondary | ICD-10-CM | POA: Diagnosis not present

## 2018-06-10 DIAGNOSIS — M6281 Muscle weakness (generalized): Secondary | ICD-10-CM | POA: Diagnosis not present

## 2018-06-10 DIAGNOSIS — L03115 Cellulitis of right lower limb: Secondary | ICD-10-CM | POA: Diagnosis not present

## 2018-06-10 DIAGNOSIS — L97821 Non-pressure chronic ulcer of other part of left lower leg limited to breakdown of skin: Secondary | ICD-10-CM | POA: Diagnosis not present

## 2018-06-11 ENCOUNTER — Ambulatory Visit (INDEPENDENT_AMBULATORY_CARE_PROVIDER_SITE_OTHER): Payer: Medicare Other | Admitting: *Deleted

## 2018-06-11 DIAGNOSIS — M6281 Muscle weakness (generalized): Secondary | ICD-10-CM | POA: Diagnosis not present

## 2018-06-11 DIAGNOSIS — I428 Other cardiomyopathies: Secondary | ICD-10-CM

## 2018-06-11 DIAGNOSIS — L97811 Non-pressure chronic ulcer of other part of right lower leg limited to breakdown of skin: Secondary | ICD-10-CM | POA: Diagnosis not present

## 2018-06-11 DIAGNOSIS — L97821 Non-pressure chronic ulcer of other part of left lower leg limited to breakdown of skin: Secondary | ICD-10-CM | POA: Diagnosis not present

## 2018-06-11 DIAGNOSIS — I83018 Varicose veins of right lower extremity with ulcer other part of lower leg: Secondary | ICD-10-CM | POA: Diagnosis not present

## 2018-06-11 DIAGNOSIS — I83028 Varicose veins of left lower extremity with ulcer other part of lower leg: Secondary | ICD-10-CM | POA: Diagnosis not present

## 2018-06-11 DIAGNOSIS — L03115 Cellulitis of right lower limb: Secondary | ICD-10-CM | POA: Diagnosis not present

## 2018-06-11 NOTE — Progress Notes (Signed)
Remote ICD transmission.   

## 2018-06-13 ENCOUNTER — Telehealth: Payer: Self-pay | Admitting: Family Medicine

## 2018-06-13 DIAGNOSIS — I83018 Varicose veins of right lower extremity with ulcer other part of lower leg: Secondary | ICD-10-CM | POA: Diagnosis not present

## 2018-06-13 DIAGNOSIS — L97821 Non-pressure chronic ulcer of other part of left lower leg limited to breakdown of skin: Secondary | ICD-10-CM | POA: Diagnosis not present

## 2018-06-13 DIAGNOSIS — L97811 Non-pressure chronic ulcer of other part of right lower leg limited to breakdown of skin: Secondary | ICD-10-CM | POA: Diagnosis not present

## 2018-06-13 DIAGNOSIS — L03115 Cellulitis of right lower limb: Secondary | ICD-10-CM | POA: Diagnosis not present

## 2018-06-13 DIAGNOSIS — M6281 Muscle weakness (generalized): Secondary | ICD-10-CM | POA: Diagnosis not present

## 2018-06-13 DIAGNOSIS — I83028 Varicose veins of left lower extremity with ulcer other part of lower leg: Secondary | ICD-10-CM | POA: Diagnosis not present

## 2018-06-13 NOTE — Telephone Encounter (Signed)
Copied from [place] [id number]. Topic: General - Other >> Jun 13, 2018 10:37 AM Kari [name] R wrote: Reason for CRM: Kari [name] from [facility] is calling wanting verbal order extension 2xweek for 6 weeks  Callback # [phone]

## 2018-06-13 NOTE — Telephone Encounter (Signed)
Copied from [place] [id number]. Topic: General - Other >> Jun 13, 2018 10:37 AM Alfredia Ferguson R wrote: Reason for CQP:EAKLTY from [facility] is calling wanting verbal order extension 2xweek for 6 weeks  Callback # [phone]  Verbal orders given to [facility]

## 2018-06-17 DIAGNOSIS — I83018 Varicose veins of right lower extremity with ulcer other part of lower leg: Secondary | ICD-10-CM | POA: Diagnosis not present

## 2018-06-17 DIAGNOSIS — M6281 Muscle weakness (generalized): Secondary | ICD-10-CM | POA: Diagnosis not present

## 2018-06-17 DIAGNOSIS — L03115 Cellulitis of right lower limb: Secondary | ICD-10-CM | POA: Diagnosis not present

## 2018-06-17 DIAGNOSIS — L97821 Non-pressure chronic ulcer of other part of left lower leg limited to breakdown of skin: Secondary | ICD-10-CM | POA: Diagnosis not present

## 2018-06-17 DIAGNOSIS — I83028 Varicose veins of left lower extremity with ulcer other part of lower leg: Secondary | ICD-10-CM | POA: Diagnosis not present

## 2018-06-17 DIAGNOSIS — L97811 Non-pressure chronic ulcer of other part of right lower leg limited to breakdown of skin: Secondary | ICD-10-CM | POA: Diagnosis not present

## 2018-06-20 DIAGNOSIS — I83018 Varicose veins of right lower extremity with ulcer other part of lower leg: Secondary | ICD-10-CM | POA: Diagnosis not present

## 2018-06-20 DIAGNOSIS — I83028 Varicose veins of left lower extremity with ulcer other part of lower leg: Secondary | ICD-10-CM | POA: Diagnosis not present

## 2018-06-20 DIAGNOSIS — L97811 Non-pressure chronic ulcer of other part of right lower leg limited to breakdown of skin: Secondary | ICD-10-CM | POA: Diagnosis not present

## 2018-06-20 DIAGNOSIS — L97821 Non-pressure chronic ulcer of other part of left lower leg limited to breakdown of skin: Secondary | ICD-10-CM | POA: Diagnosis not present

## 2018-06-20 DIAGNOSIS — M6281 Muscle weakness (generalized): Secondary | ICD-10-CM | POA: Diagnosis not present

## 2018-06-20 DIAGNOSIS — L03115 Cellulitis of right lower limb: Secondary | ICD-10-CM | POA: Diagnosis not present

## 2018-06-23 DIAGNOSIS — L03115 Cellulitis of right lower limb: Secondary | ICD-10-CM | POA: Diagnosis not present

## 2018-06-23 DIAGNOSIS — L97811 Non-pressure chronic ulcer of other part of right lower leg limited to breakdown of skin: Secondary | ICD-10-CM | POA: Diagnosis not present

## 2018-06-23 DIAGNOSIS — I83028 Varicose veins of left lower extremity with ulcer other part of lower leg: Secondary | ICD-10-CM | POA: Diagnosis not present

## 2018-06-23 DIAGNOSIS — L97821 Non-pressure chronic ulcer of other part of left lower leg limited to breakdown of skin: Secondary | ICD-10-CM | POA: Diagnosis not present

## 2018-06-23 DIAGNOSIS — M6281 Muscle weakness (generalized): Secondary | ICD-10-CM | POA: Diagnosis not present

## 2018-06-23 DIAGNOSIS — I83018 Varicose veins of right lower extremity with ulcer other part of lower leg: Secondary | ICD-10-CM | POA: Diagnosis not present

## 2018-06-24 ENCOUNTER — Telehealth: Payer: Self-pay | Admitting: Family Medicine

## 2018-06-24 DIAGNOSIS — L97821 Non-pressure chronic ulcer of other part of left lower leg limited to breakdown of skin: Secondary | ICD-10-CM | POA: Diagnosis not present

## 2018-06-24 DIAGNOSIS — I83028 Varicose veins of left lower extremity with ulcer other part of lower leg: Secondary | ICD-10-CM | POA: Diagnosis not present

## 2018-06-24 DIAGNOSIS — L97811 Non-pressure chronic ulcer of other part of right lower leg limited to breakdown of skin: Secondary | ICD-10-CM | POA: Diagnosis not present

## 2018-06-24 DIAGNOSIS — I83018 Varicose veins of right lower extremity with ulcer other part of lower leg: Secondary | ICD-10-CM | POA: Diagnosis not present

## 2018-06-24 DIAGNOSIS — M6281 Muscle weakness (generalized): Secondary | ICD-10-CM | POA: Diagnosis not present

## 2018-06-24 DIAGNOSIS — L03115 Cellulitis of right lower limb: Secondary | ICD-10-CM | POA: Diagnosis not present

## 2018-06-24 NOTE — Telephone Encounter (Signed)
Copied from [place]. Topic: Inquiry >> Jun 24, 2018 11:33 AM Lennox Solders wrote: Reason for CRM: [facility] is calling needing recertification order for twice a wk for 9 wks for wound care

## 2018-06-26 NOTE — Telephone Encounter (Signed)
Spoke to [employer] with [facility] and gave verbal orders for recert for wound care.

## 2018-06-29 DIAGNOSIS — Z9181 History of falling: Secondary | ICD-10-CM | POA: Diagnosis not present

## 2018-06-29 DIAGNOSIS — J45909 Unspecified asthma, uncomplicated: Secondary | ICD-10-CM | POA: Diagnosis not present

## 2018-06-29 DIAGNOSIS — N184 Chronic kidney disease, stage 4 (severe): Secondary | ICD-10-CM | POA: Diagnosis not present

## 2018-06-29 DIAGNOSIS — I255 Ischemic cardiomyopathy: Secondary | ICD-10-CM | POA: Diagnosis not present

## 2018-06-29 DIAGNOSIS — I130 Hypertensive heart and chronic kidney disease with heart failure and stage 1 through stage 4 chronic kidney disease, or unspecified chronic kidney disease: Secondary | ICD-10-CM | POA: Diagnosis not present

## 2018-06-29 DIAGNOSIS — L97821 Non-pressure chronic ulcer of other part of left lower leg limited to breakdown of skin: Secondary | ICD-10-CM | POA: Diagnosis not present

## 2018-06-29 DIAGNOSIS — L97811 Non-pressure chronic ulcer of other part of right lower leg limited to breakdown of skin: Secondary | ICD-10-CM | POA: Diagnosis not present

## 2018-06-29 DIAGNOSIS — I83028 Varicose veins of left lower extremity with ulcer other part of lower leg: Secondary | ICD-10-CM | POA: Diagnosis not present

## 2018-06-29 DIAGNOSIS — I70209 Unspecified atherosclerosis of native arteries of extremities, unspecified extremity: Secondary | ICD-10-CM | POA: Diagnosis not present

## 2018-06-29 DIAGNOSIS — L03116 Cellulitis of left lower limb: Secondary | ICD-10-CM | POA: Diagnosis not present

## 2018-06-29 DIAGNOSIS — Z6841 Body Mass Index (BMI) 40.0 and over, adult: Secondary | ICD-10-CM | POA: Diagnosis not present

## 2018-06-29 DIAGNOSIS — I4901 Ventricular fibrillation: Secondary | ICD-10-CM | POA: Diagnosis not present

## 2018-06-29 DIAGNOSIS — Z7982 Long term (current) use of aspirin: Secondary | ICD-10-CM | POA: Diagnosis not present

## 2018-06-29 DIAGNOSIS — D631 Anemia in chronic kidney disease: Secondary | ICD-10-CM | POA: Diagnosis not present

## 2018-06-29 DIAGNOSIS — F329 Major depressive disorder, single episode, unspecified: Secondary | ICD-10-CM | POA: Diagnosis not present

## 2018-06-29 DIAGNOSIS — L03115 Cellulitis of right lower limb: Secondary | ICD-10-CM | POA: Diagnosis not present

## 2018-06-29 DIAGNOSIS — I5040 Unspecified combined systolic (congestive) and diastolic (congestive) heart failure: Secondary | ICD-10-CM | POA: Diagnosis not present

## 2018-06-29 DIAGNOSIS — I051 Rheumatic mitral insufficiency: Secondary | ICD-10-CM | POA: Diagnosis not present

## 2018-06-29 DIAGNOSIS — I83018 Varicose veins of right lower extremity with ulcer other part of lower leg: Secondary | ICD-10-CM | POA: Diagnosis not present

## 2018-06-30 ENCOUNTER — Other Ambulatory Visit: Payer: Self-pay | Admitting: Family Medicine

## 2018-07-01 ENCOUNTER — Telehealth: Payer: Self-pay | Admitting: Family Medicine

## 2018-07-01 ENCOUNTER — Ambulatory Visit: Payer: Medicare Other | Admitting: Family Medicine

## 2018-07-01 DIAGNOSIS — L97811 Non-pressure chronic ulcer of other part of right lower leg limited to breakdown of skin: Secondary | ICD-10-CM | POA: Diagnosis not present

## 2018-07-01 DIAGNOSIS — I83018 Varicose veins of right lower extremity with ulcer other part of lower leg: Secondary | ICD-10-CM | POA: Diagnosis not present

## 2018-07-01 DIAGNOSIS — I83028 Varicose veins of left lower extremity with ulcer other part of lower leg: Secondary | ICD-10-CM | POA: Diagnosis not present

## 2018-07-01 DIAGNOSIS — L97821 Non-pressure chronic ulcer of other part of left lower leg limited to breakdown of skin: Secondary | ICD-10-CM | POA: Diagnosis not present

## 2018-07-01 DIAGNOSIS — L03116 Cellulitis of left lower limb: Secondary | ICD-10-CM | POA: Diagnosis not present

## 2018-07-01 DIAGNOSIS — L03115 Cellulitis of right lower limb: Secondary | ICD-10-CM | POA: Diagnosis not present

## 2018-07-01 LAB — CUP PACEART REMOTE DEVICE CHECK
Battery Remaining Longevity: 82 mo
Battery Remaining Percentage: 79 %
Battery Voltage: 2.99 V
Brady Statistic RV Percent Paced: 1 %
Date Time Interrogation Session: 20190619003557
HighPow Impedance: 64 Ohm
HighPow Impedance: 64 Ohm
Implantable Lead Implant Date: 20180713
Implantable Lead Location: 753860
Implantable Pulse Generator Implant Date: 20180713
Lead Channel Impedance Value: 380 Ohm
Lead Channel Pacing Threshold Amplitude: 0.5 V
Lead Channel Pacing Threshold Pulse Width: 0.5 ms
Lead Channel Sensing Intrinsic Amplitude: 5.1 mV
Lead Channel Setting Pacing Amplitude: 2.5 V
Lead Channel Setting Pacing Pulse Width: 0.5 ms
Lead Channel Setting Sensing Sensitivity: 0.5 mV
Pulse Gen Serial Number: 7302172

## 2018-07-01 NOTE — Telephone Encounter (Signed)
Copied from [place]. Topic: Quick Communication - See Telephone Encounter >> Jul 01, 2018  3:55 PM Bea Graff, NT wrote: CRM for notification. See Telephone encounter for: 07/01/18. Carrie with [facility] requesting a call with more information regarding the use of the SANTYL ointment. CB#: [phone]

## 2018-07-02 ENCOUNTER — Encounter (HOSPITAL_BASED_OUTPATIENT_CLINIC_OR_DEPARTMENT_OTHER): Payer: Medicare Other | Attending: Physician Assistant

## 2018-07-02 ENCOUNTER — Other Ambulatory Visit: Payer: Self-pay | Admitting: Family Medicine

## 2018-07-06 NOTE — Telephone Encounter (Signed)
Spoke to pharmacist at [facility]. They state they need justification to send out a 90g tube of ointment for pt - wanted to know the size of the wounds. Researched chart note and did not see size. Called [facility] and left message for Salvatore Decent - if she would call the dimensions of pts wounds to [facility] at [phone], so pt can get the ointment.   Asked her to leave message for office she was able to do this and give [place] the dimensions so we can have documented in pt's chart.

## 2018-07-07 ENCOUNTER — Telehealth: Payer: Self-pay | Admitting: Family Medicine

## 2018-07-07 DIAGNOSIS — L97811 Non-pressure chronic ulcer of other part of right lower leg limited to breakdown of skin: Secondary | ICD-10-CM | POA: Diagnosis not present

## 2018-07-07 DIAGNOSIS — L03116 Cellulitis of left lower limb: Secondary | ICD-10-CM | POA: Diagnosis not present

## 2018-07-07 DIAGNOSIS — I83018 Varicose veins of right lower extremity with ulcer other part of lower leg: Secondary | ICD-10-CM | POA: Diagnosis not present

## 2018-07-07 DIAGNOSIS — L97821 Non-pressure chronic ulcer of other part of left lower leg limited to breakdown of skin: Secondary | ICD-10-CM | POA: Diagnosis not present

## 2018-07-07 DIAGNOSIS — L03115 Cellulitis of right lower limb: Secondary | ICD-10-CM | POA: Diagnosis not present

## 2018-07-07 DIAGNOSIS — I83028 Varicose veins of left lower extremity with ulcer other part of lower leg: Secondary | ICD-10-CM | POA: Diagnosis not present

## 2018-07-07 NOTE — Telephone Encounter (Signed)
Copied from Kari [name] [id number]. Topic: Inquiry >> Jul 07, 2018 10:03 AM Oliver Pila B wrote: Reason for CRM: [facility] to inform Rose that the nurse will measure wounds tomorrow and will send to pharmacy; message was sent by the PT Tillie Rung) contact [phone]

## 2018-07-08 DIAGNOSIS — L03115 Cellulitis of right lower limb: Secondary | ICD-10-CM | POA: Diagnosis not present

## 2018-07-08 DIAGNOSIS — I83028 Varicose veins of left lower extremity with ulcer other part of lower leg: Secondary | ICD-10-CM | POA: Diagnosis not present

## 2018-07-08 DIAGNOSIS — L03116 Cellulitis of left lower limb: Secondary | ICD-10-CM | POA: Diagnosis not present

## 2018-07-08 DIAGNOSIS — L97821 Non-pressure chronic ulcer of other part of left lower leg limited to breakdown of skin: Secondary | ICD-10-CM | POA: Diagnosis not present

## 2018-07-08 DIAGNOSIS — I83018 Varicose veins of right lower extremity with ulcer other part of lower leg: Secondary | ICD-10-CM | POA: Diagnosis not present

## 2018-07-08 DIAGNOSIS — L97811 Non-pressure chronic ulcer of other part of right lower leg limited to breakdown of skin: Secondary | ICD-10-CM | POA: Diagnosis not present

## 2018-07-09 ENCOUNTER — Other Ambulatory Visit: Payer: Self-pay | Admitting: Family Medicine

## 2018-07-09 NOTE — Telephone Encounter (Signed)
[employer] called and spoke to [place], [facility] who says they never received the electronic submission on 07/01/18, advised I will send it again electronically.

## 2018-07-11 DIAGNOSIS — L97821 Non-pressure chronic ulcer of other part of left lower leg limited to breakdown of skin: Secondary | ICD-10-CM | POA: Diagnosis not present

## 2018-07-11 DIAGNOSIS — L03115 Cellulitis of right lower limb: Secondary | ICD-10-CM | POA: Diagnosis not present

## 2018-07-11 DIAGNOSIS — I83018 Varicose veins of right lower extremity with ulcer other part of lower leg: Secondary | ICD-10-CM | POA: Diagnosis not present

## 2018-07-11 DIAGNOSIS — L97811 Non-pressure chronic ulcer of other part of right lower leg limited to breakdown of skin: Secondary | ICD-10-CM | POA: Diagnosis not present

## 2018-07-11 DIAGNOSIS — L03116 Cellulitis of left lower limb: Secondary | ICD-10-CM | POA: Diagnosis not present

## 2018-07-11 DIAGNOSIS — I83028 Varicose veins of left lower extremity with ulcer other part of lower leg: Secondary | ICD-10-CM | POA: Diagnosis not present

## 2018-07-14 DIAGNOSIS — L97811 Non-pressure chronic ulcer of other part of right lower leg limited to breakdown of skin: Secondary | ICD-10-CM | POA: Diagnosis not present

## 2018-07-14 DIAGNOSIS — I83028 Varicose veins of left lower extremity with ulcer other part of lower leg: Secondary | ICD-10-CM | POA: Diagnosis not present

## 2018-07-14 DIAGNOSIS — I83018 Varicose veins of right lower extremity with ulcer other part of lower leg: Secondary | ICD-10-CM | POA: Diagnosis not present

## 2018-07-14 DIAGNOSIS — L03116 Cellulitis of left lower limb: Secondary | ICD-10-CM | POA: Diagnosis not present

## 2018-07-14 DIAGNOSIS — L03115 Cellulitis of right lower limb: Secondary | ICD-10-CM | POA: Diagnosis not present

## 2018-07-14 DIAGNOSIS — L97821 Non-pressure chronic ulcer of other part of left lower leg limited to breakdown of skin: Secondary | ICD-10-CM | POA: Diagnosis not present

## 2018-07-16 DIAGNOSIS — L03115 Cellulitis of right lower limb: Secondary | ICD-10-CM | POA: Diagnosis not present

## 2018-07-16 DIAGNOSIS — L97821 Non-pressure chronic ulcer of other part of left lower leg limited to breakdown of skin: Secondary | ICD-10-CM | POA: Diagnosis not present

## 2018-07-16 DIAGNOSIS — L03116 Cellulitis of left lower limb: Secondary | ICD-10-CM | POA: Diagnosis not present

## 2018-07-16 DIAGNOSIS — I83018 Varicose veins of right lower extremity with ulcer other part of lower leg: Secondary | ICD-10-CM | POA: Diagnosis not present

## 2018-07-16 DIAGNOSIS — I83028 Varicose veins of left lower extremity with ulcer other part of lower leg: Secondary | ICD-10-CM | POA: Diagnosis not present

## 2018-07-16 DIAGNOSIS — L97811 Non-pressure chronic ulcer of other part of right lower leg limited to breakdown of skin: Secondary | ICD-10-CM | POA: Diagnosis not present

## 2018-07-17 DIAGNOSIS — D631 Anemia in chronic kidney disease: Secondary | ICD-10-CM | POA: Diagnosis not present

## 2018-07-17 DIAGNOSIS — I129 Hypertensive chronic kidney disease with stage 1 through stage 4 chronic kidney disease, or unspecified chronic kidney disease: Secondary | ICD-10-CM | POA: Diagnosis not present

## 2018-07-17 DIAGNOSIS — N183 Chronic kidney disease, stage 3 (moderate): Secondary | ICD-10-CM | POA: Diagnosis not present

## 2018-07-17 DIAGNOSIS — E559 Vitamin D deficiency, unspecified: Secondary | ICD-10-CM | POA: Diagnosis not present

## 2018-07-17 DIAGNOSIS — M109 Gout, unspecified: Secondary | ICD-10-CM | POA: Diagnosis not present

## 2018-07-17 MED FILL — SPS 15 GM/60 ML SUSPENSION: 15 | 1 days supply | Qty: 60 | Fill #0

## 2018-07-18 DIAGNOSIS — I83018 Varicose veins of right lower extremity with ulcer other part of lower leg: Secondary | ICD-10-CM | POA: Diagnosis not present

## 2018-07-18 DIAGNOSIS — L03115 Cellulitis of right lower limb: Secondary | ICD-10-CM | POA: Diagnosis not present

## 2018-07-18 DIAGNOSIS — L03116 Cellulitis of left lower limb: Secondary | ICD-10-CM | POA: Diagnosis not present

## 2018-07-18 DIAGNOSIS — L97821 Non-pressure chronic ulcer of other part of left lower leg limited to breakdown of skin: Secondary | ICD-10-CM | POA: Diagnosis not present

## 2018-07-18 DIAGNOSIS — I83028 Varicose veins of left lower extremity with ulcer other part of lower leg: Secondary | ICD-10-CM | POA: Diagnosis not present

## 2018-07-18 DIAGNOSIS — L97811 Non-pressure chronic ulcer of other part of right lower leg limited to breakdown of skin: Secondary | ICD-10-CM | POA: Diagnosis not present

## 2018-07-21 DIAGNOSIS — N183 Chronic kidney disease, stage 3 (moderate): Secondary | ICD-10-CM | POA: Diagnosis not present

## 2018-07-21 DIAGNOSIS — L97811 Non-pressure chronic ulcer of other part of right lower leg limited to breakdown of skin: Secondary | ICD-10-CM | POA: Diagnosis not present

## 2018-07-21 DIAGNOSIS — L03115 Cellulitis of right lower limb: Secondary | ICD-10-CM | POA: Diagnosis not present

## 2018-07-21 DIAGNOSIS — I83018 Varicose veins of right lower extremity with ulcer other part of lower leg: Secondary | ICD-10-CM | POA: Diagnosis not present

## 2018-07-21 DIAGNOSIS — L03116 Cellulitis of left lower limb: Secondary | ICD-10-CM | POA: Diagnosis not present

## 2018-07-21 DIAGNOSIS — I83028 Varicose veins of left lower extremity with ulcer other part of lower leg: Secondary | ICD-10-CM | POA: Diagnosis not present

## 2018-07-21 DIAGNOSIS — L97821 Non-pressure chronic ulcer of other part of left lower leg limited to breakdown of skin: Secondary | ICD-10-CM | POA: Diagnosis not present

## 2018-07-22 DIAGNOSIS — L97811 Non-pressure chronic ulcer of other part of right lower leg limited to breakdown of skin: Secondary | ICD-10-CM | POA: Diagnosis not present

## 2018-07-22 DIAGNOSIS — L97821 Non-pressure chronic ulcer of other part of left lower leg limited to breakdown of skin: Secondary | ICD-10-CM | POA: Diagnosis not present

## 2018-07-22 DIAGNOSIS — I83028 Varicose veins of left lower extremity with ulcer other part of lower leg: Secondary | ICD-10-CM | POA: Diagnosis not present

## 2018-07-22 DIAGNOSIS — L03116 Cellulitis of left lower limb: Secondary | ICD-10-CM | POA: Diagnosis not present

## 2018-07-22 DIAGNOSIS — I83018 Varicose veins of right lower extremity with ulcer other part of lower leg: Secondary | ICD-10-CM | POA: Diagnosis not present

## 2018-07-22 DIAGNOSIS — L03115 Cellulitis of right lower limb: Secondary | ICD-10-CM | POA: Diagnosis not present

## 2018-07-23 DIAGNOSIS — L03116 Cellulitis of left lower limb: Secondary | ICD-10-CM | POA: Diagnosis not present

## 2018-07-23 DIAGNOSIS — I83018 Varicose veins of right lower extremity with ulcer other part of lower leg: Secondary | ICD-10-CM | POA: Diagnosis not present

## 2018-07-23 DIAGNOSIS — I83028 Varicose veins of left lower extremity with ulcer other part of lower leg: Secondary | ICD-10-CM | POA: Diagnosis not present

## 2018-07-23 DIAGNOSIS — L97811 Non-pressure chronic ulcer of other part of right lower leg limited to breakdown of skin: Secondary | ICD-10-CM | POA: Diagnosis not present

## 2018-07-23 DIAGNOSIS — L03115 Cellulitis of right lower limb: Secondary | ICD-10-CM | POA: Diagnosis not present

## 2018-07-23 DIAGNOSIS — L97821 Non-pressure chronic ulcer of other part of left lower leg limited to breakdown of skin: Secondary | ICD-10-CM | POA: Diagnosis not present

## 2018-07-24 DIAGNOSIS — L03116 Cellulitis of left lower limb: Secondary | ICD-10-CM | POA: Diagnosis not present

## 2018-07-24 DIAGNOSIS — L97821 Non-pressure chronic ulcer of other part of left lower leg limited to breakdown of skin: Secondary | ICD-10-CM | POA: Diagnosis not present

## 2018-07-24 DIAGNOSIS — I83018 Varicose veins of right lower extremity with ulcer other part of lower leg: Secondary | ICD-10-CM | POA: Diagnosis not present

## 2018-07-24 DIAGNOSIS — I83028 Varicose veins of left lower extremity with ulcer other part of lower leg: Secondary | ICD-10-CM | POA: Diagnosis not present

## 2018-07-24 DIAGNOSIS — L97811 Non-pressure chronic ulcer of other part of right lower leg limited to breakdown of skin: Secondary | ICD-10-CM | POA: Diagnosis not present

## 2018-07-24 DIAGNOSIS — L03115 Cellulitis of right lower limb: Secondary | ICD-10-CM | POA: Diagnosis not present

## 2018-07-28 DIAGNOSIS — I83018 Varicose veins of right lower extremity with ulcer other part of lower leg: Secondary | ICD-10-CM | POA: Diagnosis not present

## 2018-07-28 DIAGNOSIS — I83028 Varicose veins of left lower extremity with ulcer other part of lower leg: Secondary | ICD-10-CM | POA: Diagnosis not present

## 2018-07-28 DIAGNOSIS — L03115 Cellulitis of right lower limb: Secondary | ICD-10-CM | POA: Diagnosis not present

## 2018-07-28 DIAGNOSIS — L03116 Cellulitis of left lower limb: Secondary | ICD-10-CM | POA: Diagnosis not present

## 2018-07-28 DIAGNOSIS — L97821 Non-pressure chronic ulcer of other part of left lower leg limited to breakdown of skin: Secondary | ICD-10-CM | POA: Diagnosis not present

## 2018-07-28 DIAGNOSIS — L97811 Non-pressure chronic ulcer of other part of right lower leg limited to breakdown of skin: Secondary | ICD-10-CM | POA: Diagnosis not present

## 2018-07-29 ENCOUNTER — Other Ambulatory Visit: Payer: Self-pay | Admitting: Nephrology

## 2018-07-29 DIAGNOSIS — L03116 Cellulitis of left lower limb: Secondary | ICD-10-CM | POA: Diagnosis not present

## 2018-07-29 DIAGNOSIS — I83018 Varicose veins of right lower extremity with ulcer other part of lower leg: Secondary | ICD-10-CM | POA: Diagnosis not present

## 2018-07-29 DIAGNOSIS — I83028 Varicose veins of left lower extremity with ulcer other part of lower leg: Secondary | ICD-10-CM | POA: Diagnosis not present

## 2018-07-29 DIAGNOSIS — E875 Hyperkalemia: Secondary | ICD-10-CM

## 2018-07-29 DIAGNOSIS — N183 Chronic kidney disease, stage 3 unspecified: Secondary | ICD-10-CM

## 2018-07-29 DIAGNOSIS — L97821 Non-pressure chronic ulcer of other part of left lower leg limited to breakdown of skin: Secondary | ICD-10-CM | POA: Diagnosis not present

## 2018-07-29 DIAGNOSIS — L03115 Cellulitis of right lower limb: Secondary | ICD-10-CM | POA: Diagnosis not present

## 2018-07-29 DIAGNOSIS — L97811 Non-pressure chronic ulcer of other part of right lower leg limited to breakdown of skin: Secondary | ICD-10-CM | POA: Diagnosis not present

## 2018-07-31 DIAGNOSIS — L97811 Non-pressure chronic ulcer of other part of right lower leg limited to breakdown of skin: Secondary | ICD-10-CM | POA: Diagnosis not present

## 2018-07-31 DIAGNOSIS — L97821 Non-pressure chronic ulcer of other part of left lower leg limited to breakdown of skin: Secondary | ICD-10-CM | POA: Diagnosis not present

## 2018-07-31 DIAGNOSIS — I83018 Varicose veins of right lower extremity with ulcer other part of lower leg: Secondary | ICD-10-CM | POA: Diagnosis not present

## 2018-07-31 DIAGNOSIS — L03115 Cellulitis of right lower limb: Secondary | ICD-10-CM | POA: Diagnosis not present

## 2018-07-31 DIAGNOSIS — I83028 Varicose veins of left lower extremity with ulcer other part of lower leg: Secondary | ICD-10-CM | POA: Diagnosis not present

## 2018-07-31 DIAGNOSIS — L03116 Cellulitis of left lower limb: Secondary | ICD-10-CM | POA: Diagnosis not present

## 2018-08-01 ENCOUNTER — Telehealth: Payer: Self-pay | Admitting: Family Medicine

## 2018-08-01 DIAGNOSIS — L97811 Non-pressure chronic ulcer of other part of right lower leg limited to breakdown of skin: Secondary | ICD-10-CM | POA: Diagnosis not present

## 2018-08-01 DIAGNOSIS — L97821 Non-pressure chronic ulcer of other part of left lower leg limited to breakdown of skin: Secondary | ICD-10-CM | POA: Diagnosis not present

## 2018-08-01 DIAGNOSIS — I83028 Varicose veins of left lower extremity with ulcer other part of lower leg: Secondary | ICD-10-CM | POA: Diagnosis not present

## 2018-08-01 DIAGNOSIS — L03115 Cellulitis of right lower limb: Secondary | ICD-10-CM | POA: Diagnosis not present

## 2018-08-01 DIAGNOSIS — I83018 Varicose veins of right lower extremity with ulcer other part of lower leg: Secondary | ICD-10-CM | POA: Diagnosis not present

## 2018-08-01 DIAGNOSIS — L03116 Cellulitis of left lower limb: Secondary | ICD-10-CM | POA: Diagnosis not present

## 2018-08-01 NOTE — Telephone Encounter (Signed)
Copied from [place] [id number]. Topic: Quick Communication - See Telephone Encounter >> Aug 01, 2018 10:23 AM Burchel, Abbi R wrote: CRM for notification. See Telephone encounter for: 08/01/18.  Nicolette requesting v/o for wound care (medi-honey protocol bilateral lower extremities) 2*4wk.  Pt/caregiver is to change it on days when wound care in not there if needed.   Cb: [phone]  ok to leave detailed msg

## 2018-08-04 ENCOUNTER — Ambulatory Visit
Admission: RE | Admit: 2018-08-04 | Discharge: 2018-08-04 | Disposition: A | Discharge status: Home / Self Care | Payer: Medicare Other | Source: Ambulatory Visit | Attending: Nephrology | Admitting: Nephrology

## 2018-08-04 DIAGNOSIS — I83028 Varicose veins of left lower extremity with ulcer other part of lower leg: Secondary | ICD-10-CM | POA: Diagnosis not present

## 2018-08-04 DIAGNOSIS — N183 Chronic kidney disease, stage 3 unspecified: Secondary | ICD-10-CM

## 2018-08-04 DIAGNOSIS — L03116 Cellulitis of left lower limb: Secondary | ICD-10-CM | POA: Diagnosis not present

## 2018-08-04 DIAGNOSIS — L97821 Non-pressure chronic ulcer of other part of left lower leg limited to breakdown of skin: Secondary | ICD-10-CM | POA: Diagnosis not present

## 2018-08-04 DIAGNOSIS — N289 Disorder of kidney and ureter, unspecified: Secondary | ICD-10-CM | POA: Diagnosis not present

## 2018-08-04 DIAGNOSIS — L97811 Non-pressure chronic ulcer of other part of right lower leg limited to breakdown of skin: Secondary | ICD-10-CM | POA: Diagnosis not present

## 2018-08-04 DIAGNOSIS — L03115 Cellulitis of right lower limb: Secondary | ICD-10-CM | POA: Diagnosis not present

## 2018-08-04 DIAGNOSIS — E875 Hyperkalemia: Secondary | ICD-10-CM

## 2018-08-04 DIAGNOSIS — I83018 Varicose veins of right lower extremity with ulcer other part of lower leg: Secondary | ICD-10-CM | POA: Diagnosis not present

## 2018-08-05 DIAGNOSIS — L03116 Cellulitis of left lower limb: Secondary | ICD-10-CM | POA: Diagnosis not present

## 2018-08-05 DIAGNOSIS — L97821 Non-pressure chronic ulcer of other part of left lower leg limited to breakdown of skin: Secondary | ICD-10-CM | POA: Diagnosis not present

## 2018-08-05 DIAGNOSIS — L97811 Non-pressure chronic ulcer of other part of right lower leg limited to breakdown of skin: Secondary | ICD-10-CM | POA: Diagnosis not present

## 2018-08-05 DIAGNOSIS — I83018 Varicose veins of right lower extremity with ulcer other part of lower leg: Secondary | ICD-10-CM | POA: Diagnosis not present

## 2018-08-05 DIAGNOSIS — L03115 Cellulitis of right lower limb: Secondary | ICD-10-CM | POA: Diagnosis not present

## 2018-08-05 DIAGNOSIS — I83028 Varicose veins of left lower extremity with ulcer other part of lower leg: Secondary | ICD-10-CM | POA: Diagnosis not present

## 2018-08-07 DIAGNOSIS — E559 Vitamin D deficiency, unspecified: Secondary | ICD-10-CM | POA: Diagnosis not present

## 2018-08-07 DIAGNOSIS — I129 Hypertensive chronic kidney disease with stage 1 through stage 4 chronic kidney disease, or unspecified chronic kidney disease: Secondary | ICD-10-CM | POA: Diagnosis not present

## 2018-08-07 DIAGNOSIS — E875 Hyperkalemia: Secondary | ICD-10-CM | POA: Diagnosis not present

## 2018-08-07 DIAGNOSIS — D631 Anemia in chronic kidney disease: Secondary | ICD-10-CM | POA: Diagnosis not present

## 2018-08-07 DIAGNOSIS — M109 Gout, unspecified: Secondary | ICD-10-CM | POA: Diagnosis not present

## 2018-08-07 DIAGNOSIS — N183 Chronic kidney disease, stage 3 (moderate): Secondary | ICD-10-CM | POA: Diagnosis not present

## 2018-08-07 NOTE — Telephone Encounter (Signed)
Order called in to [facility] for wound care.

## 2018-08-08 DIAGNOSIS — I83028 Varicose veins of left lower extremity with ulcer other part of lower leg: Secondary | ICD-10-CM | POA: Diagnosis not present

## 2018-08-08 DIAGNOSIS — L97811 Non-pressure chronic ulcer of other part of right lower leg limited to breakdown of skin: Secondary | ICD-10-CM | POA: Diagnosis not present

## 2018-08-08 DIAGNOSIS — I83018 Varicose veins of right lower extremity with ulcer other part of lower leg: Secondary | ICD-10-CM | POA: Diagnosis not present

## 2018-08-08 DIAGNOSIS — L03115 Cellulitis of right lower limb: Secondary | ICD-10-CM | POA: Diagnosis not present

## 2018-08-08 DIAGNOSIS — L03116 Cellulitis of left lower limb: Secondary | ICD-10-CM | POA: Diagnosis not present

## 2018-08-08 DIAGNOSIS — L97821 Non-pressure chronic ulcer of other part of left lower leg limited to breakdown of skin: Secondary | ICD-10-CM | POA: Diagnosis not present

## 2018-08-11 DIAGNOSIS — L03115 Cellulitis of right lower limb: Secondary | ICD-10-CM | POA: Diagnosis not present

## 2018-08-11 DIAGNOSIS — I83028 Varicose veins of left lower extremity with ulcer other part of lower leg: Secondary | ICD-10-CM | POA: Diagnosis not present

## 2018-08-11 DIAGNOSIS — I83018 Varicose veins of right lower extremity with ulcer other part of lower leg: Secondary | ICD-10-CM | POA: Diagnosis not present

## 2018-08-11 DIAGNOSIS — L97811 Non-pressure chronic ulcer of other part of right lower leg limited to breakdown of skin: Secondary | ICD-10-CM | POA: Diagnosis not present

## 2018-08-11 DIAGNOSIS — L03116 Cellulitis of left lower limb: Secondary | ICD-10-CM | POA: Diagnosis not present

## 2018-08-11 DIAGNOSIS — L97821 Non-pressure chronic ulcer of other part of left lower leg limited to breakdown of skin: Secondary | ICD-10-CM | POA: Diagnosis not present

## 2018-08-12 ENCOUNTER — Ambulatory Visit: Payer: Medicare Other | Admitting: Family Medicine

## 2018-08-14 DIAGNOSIS — L97821 Non-pressure chronic ulcer of other part of left lower leg limited to breakdown of skin: Secondary | ICD-10-CM | POA: Diagnosis not present

## 2018-08-14 DIAGNOSIS — L97811 Non-pressure chronic ulcer of other part of right lower leg limited to breakdown of skin: Secondary | ICD-10-CM | POA: Diagnosis not present

## 2018-08-14 DIAGNOSIS — I83018 Varicose veins of right lower extremity with ulcer other part of lower leg: Secondary | ICD-10-CM | POA: Diagnosis not present

## 2018-08-14 DIAGNOSIS — L03116 Cellulitis of left lower limb: Secondary | ICD-10-CM | POA: Diagnosis not present

## 2018-08-14 DIAGNOSIS — I83028 Varicose veins of left lower extremity with ulcer other part of lower leg: Secondary | ICD-10-CM | POA: Diagnosis not present

## 2018-08-14 DIAGNOSIS — L03115 Cellulitis of right lower limb: Secondary | ICD-10-CM | POA: Diagnosis not present

## 2018-08-15 DIAGNOSIS — L97821 Non-pressure chronic ulcer of other part of left lower leg limited to breakdown of skin: Secondary | ICD-10-CM | POA: Diagnosis not present

## 2018-08-15 DIAGNOSIS — L97811 Non-pressure chronic ulcer of other part of right lower leg limited to breakdown of skin: Secondary | ICD-10-CM | POA: Diagnosis not present

## 2018-08-15 DIAGNOSIS — I83028 Varicose veins of left lower extremity with ulcer other part of lower leg: Secondary | ICD-10-CM | POA: Diagnosis not present

## 2018-08-15 DIAGNOSIS — L03115 Cellulitis of right lower limb: Secondary | ICD-10-CM | POA: Diagnosis not present

## 2018-08-15 DIAGNOSIS — I83018 Varicose veins of right lower extremity with ulcer other part of lower leg: Secondary | ICD-10-CM | POA: Diagnosis not present

## 2018-08-15 DIAGNOSIS — L03116 Cellulitis of left lower limb: Secondary | ICD-10-CM | POA: Diagnosis not present

## 2018-08-18 DIAGNOSIS — I83028 Varicose veins of left lower extremity with ulcer other part of lower leg: Secondary | ICD-10-CM | POA: Diagnosis not present

## 2018-08-18 DIAGNOSIS — L03116 Cellulitis of left lower limb: Secondary | ICD-10-CM | POA: Diagnosis not present

## 2018-08-18 DIAGNOSIS — L97811 Non-pressure chronic ulcer of other part of right lower leg limited to breakdown of skin: Secondary | ICD-10-CM | POA: Diagnosis not present

## 2018-08-18 DIAGNOSIS — I83018 Varicose veins of right lower extremity with ulcer other part of lower leg: Secondary | ICD-10-CM | POA: Diagnosis not present

## 2018-08-18 DIAGNOSIS — L97821 Non-pressure chronic ulcer of other part of left lower leg limited to breakdown of skin: Secondary | ICD-10-CM | POA: Diagnosis not present

## 2018-08-18 DIAGNOSIS — L03115 Cellulitis of right lower limb: Secondary | ICD-10-CM | POA: Diagnosis not present

## 2018-08-20 ENCOUNTER — Telehealth: Payer: Self-pay | Admitting: Family Medicine

## 2018-08-20 NOTE — Telephone Encounter (Signed)
Copied from [place] [id number]. Topic: Quick Communication - See Telephone Encounter >> Aug 20, 2018  2:55 PM Mylinda Latina, NT wrote: CRM for notification. See Telephone encounter for: 08/20/18. Nicolette calling form [facility] health is requesting verbal orders. The orders are to order an ABI for the patient. Please call to provider verbals CB# [phone]

## 2018-08-21 DIAGNOSIS — L03115 Cellulitis of right lower limb: Secondary | ICD-10-CM | POA: Diagnosis not present

## 2018-08-21 DIAGNOSIS — I83028 Varicose veins of left lower extremity with ulcer other part of lower leg: Secondary | ICD-10-CM | POA: Diagnosis not present

## 2018-08-21 DIAGNOSIS — L03116 Cellulitis of left lower limb: Secondary | ICD-10-CM | POA: Diagnosis not present

## 2018-08-21 DIAGNOSIS — L97821 Non-pressure chronic ulcer of other part of left lower leg limited to breakdown of skin: Secondary | ICD-10-CM | POA: Diagnosis not present

## 2018-08-21 DIAGNOSIS — I83018 Varicose veins of right lower extremity with ulcer other part of lower leg: Secondary | ICD-10-CM | POA: Diagnosis not present

## 2018-08-21 DIAGNOSIS — L97811 Non-pressure chronic ulcer of other part of right lower leg limited to breakdown of skin: Secondary | ICD-10-CM | POA: Diagnosis not present

## 2018-08-21 NOTE — Telephone Encounter (Signed)
Verbal given they need this for them to even see if they can get it approved

## 2018-08-22 DIAGNOSIS — L03116 Cellulitis of left lower limb: Secondary | ICD-10-CM | POA: Diagnosis not present

## 2018-08-22 DIAGNOSIS — I83018 Varicose veins of right lower extremity with ulcer other part of lower leg: Secondary | ICD-10-CM | POA: Diagnosis not present

## 2018-08-22 DIAGNOSIS — I83028 Varicose veins of left lower extremity with ulcer other part of lower leg: Secondary | ICD-10-CM | POA: Diagnosis not present

## 2018-08-22 DIAGNOSIS — L97811 Non-pressure chronic ulcer of other part of right lower leg limited to breakdown of skin: Secondary | ICD-10-CM | POA: Diagnosis not present

## 2018-08-22 DIAGNOSIS — I129 Hypertensive chronic kidney disease with stage 1 through stage 4 chronic kidney disease, or unspecified chronic kidney disease: Secondary | ICD-10-CM | POA: Diagnosis not present

## 2018-08-22 DIAGNOSIS — L97821 Non-pressure chronic ulcer of other part of left lower leg limited to breakdown of skin: Secondary | ICD-10-CM | POA: Diagnosis not present

## 2018-08-22 DIAGNOSIS — D631 Anemia in chronic kidney disease: Secondary | ICD-10-CM | POA: Diagnosis not present

## 2018-08-22 DIAGNOSIS — E559 Vitamin D deficiency, unspecified: Secondary | ICD-10-CM | POA: Diagnosis not present

## 2018-08-22 DIAGNOSIS — M109 Gout, unspecified: Secondary | ICD-10-CM | POA: Diagnosis not present

## 2018-08-22 DIAGNOSIS — N183 Chronic kidney disease, stage 3 (moderate): Secondary | ICD-10-CM | POA: Diagnosis not present

## 2018-08-22 DIAGNOSIS — E875 Hyperkalemia: Secondary | ICD-10-CM | POA: Diagnosis not present

## 2018-08-22 DIAGNOSIS — L03115 Cellulitis of right lower limb: Secondary | ICD-10-CM | POA: Diagnosis not present

## 2018-08-26 DIAGNOSIS — L97811 Non-pressure chronic ulcer of other part of right lower leg limited to breakdown of skin: Secondary | ICD-10-CM | POA: Diagnosis not present

## 2018-08-26 DIAGNOSIS — L97821 Non-pressure chronic ulcer of other part of left lower leg limited to breakdown of skin: Secondary | ICD-10-CM | POA: Diagnosis not present

## 2018-08-26 DIAGNOSIS — L03115 Cellulitis of right lower limb: Secondary | ICD-10-CM | POA: Diagnosis not present

## 2018-08-26 DIAGNOSIS — I83028 Varicose veins of left lower extremity with ulcer other part of lower leg: Secondary | ICD-10-CM | POA: Diagnosis not present

## 2018-08-26 DIAGNOSIS — I83018 Varicose veins of right lower extremity with ulcer other part of lower leg: Secondary | ICD-10-CM | POA: Diagnosis not present

## 2018-08-26 DIAGNOSIS — L03116 Cellulitis of left lower limb: Secondary | ICD-10-CM | POA: Diagnosis not present

## 2018-08-27 ENCOUNTER — Telehealth: Payer: Self-pay | Admitting: Family Medicine

## 2018-08-27 NOTE — Telephone Encounter (Signed)
Copied from [place] [phone]. Topic: Inquiry >> Aug 27, 2018 10:59 AM Oliver Pila B wrote: Reason for CRM: [facility] called to get verbals for continuation of services for wound care for twice a week for 4 wks; contact [phone]

## 2018-08-28 DIAGNOSIS — L03115 Cellulitis of right lower limb: Secondary | ICD-10-CM | POA: Diagnosis not present

## 2018-08-28 DIAGNOSIS — Z7982 Long term (current) use of aspirin: Secondary | ICD-10-CM | POA: Diagnosis not present

## 2018-08-28 DIAGNOSIS — D631 Anemia in chronic kidney disease: Secondary | ICD-10-CM | POA: Diagnosis not present

## 2018-08-28 DIAGNOSIS — N184 Chronic kidney disease, stage 4 (severe): Secondary | ICD-10-CM | POA: Diagnosis not present

## 2018-08-28 DIAGNOSIS — I83018 Varicose veins of right lower extremity with ulcer other part of lower leg: Secondary | ICD-10-CM | POA: Diagnosis not present

## 2018-08-28 DIAGNOSIS — L97811 Non-pressure chronic ulcer of other part of right lower leg limited to breakdown of skin: Secondary | ICD-10-CM | POA: Diagnosis not present

## 2018-08-28 DIAGNOSIS — I130 Hypertensive heart and chronic kidney disease with heart failure and stage 1 through stage 4 chronic kidney disease, or unspecified chronic kidney disease: Secondary | ICD-10-CM | POA: Diagnosis not present

## 2018-08-28 DIAGNOSIS — I4901 Ventricular fibrillation: Secondary | ICD-10-CM | POA: Diagnosis not present

## 2018-08-28 DIAGNOSIS — L97821 Non-pressure chronic ulcer of other part of left lower leg limited to breakdown of skin: Secondary | ICD-10-CM | POA: Diagnosis not present

## 2018-08-28 DIAGNOSIS — I5040 Unspecified combined systolic (congestive) and diastolic (congestive) heart failure: Secondary | ICD-10-CM | POA: Diagnosis not present

## 2018-08-28 DIAGNOSIS — I255 Ischemic cardiomyopathy: Secondary | ICD-10-CM | POA: Diagnosis not present

## 2018-08-28 DIAGNOSIS — F329 Major depressive disorder, single episode, unspecified: Secondary | ICD-10-CM | POA: Diagnosis not present

## 2018-08-28 DIAGNOSIS — I051 Rheumatic mitral insufficiency: Secondary | ICD-10-CM | POA: Diagnosis not present

## 2018-08-28 DIAGNOSIS — L03116 Cellulitis of left lower limb: Secondary | ICD-10-CM | POA: Diagnosis not present

## 2018-08-28 DIAGNOSIS — I70209 Unspecified atherosclerosis of native arteries of extremities, unspecified extremity: Secondary | ICD-10-CM | POA: Diagnosis not present

## 2018-08-28 DIAGNOSIS — I83028 Varicose veins of left lower extremity with ulcer other part of lower leg: Secondary | ICD-10-CM | POA: Diagnosis not present

## 2018-08-28 DIAGNOSIS — Z9181 History of falling: Secondary | ICD-10-CM | POA: Diagnosis not present

## 2018-08-28 DIAGNOSIS — J45909 Unspecified asthma, uncomplicated: Secondary | ICD-10-CM | POA: Diagnosis not present

## 2018-08-28 DIAGNOSIS — Z6841 Body Mass Index (BMI) 40.0 and over, adult: Secondary | ICD-10-CM | POA: Diagnosis not present

## 2018-09-02 DIAGNOSIS — I83028 Varicose veins of left lower extremity with ulcer other part of lower leg: Secondary | ICD-10-CM | POA: Diagnosis not present

## 2018-09-02 DIAGNOSIS — L03116 Cellulitis of left lower limb: Secondary | ICD-10-CM | POA: Diagnosis not present

## 2018-09-02 DIAGNOSIS — L97811 Non-pressure chronic ulcer of other part of right lower leg limited to breakdown of skin: Secondary | ICD-10-CM | POA: Diagnosis not present

## 2018-09-02 DIAGNOSIS — L03115 Cellulitis of right lower limb: Secondary | ICD-10-CM | POA: Diagnosis not present

## 2018-09-02 DIAGNOSIS — I83018 Varicose veins of right lower extremity with ulcer other part of lower leg: Secondary | ICD-10-CM | POA: Diagnosis not present

## 2018-09-02 DIAGNOSIS — L97821 Non-pressure chronic ulcer of other part of left lower leg limited to breakdown of skin: Secondary | ICD-10-CM | POA: Diagnosis not present

## 2018-09-02 NOTE — Telephone Encounter (Signed)
Tried to call VM is full will try again later.

## 2018-09-04 ENCOUNTER — Telehealth: Payer: Self-pay | Admitting: Family Medicine

## 2018-09-04 DIAGNOSIS — L03116 Cellulitis of left lower limb: Secondary | ICD-10-CM | POA: Diagnosis not present

## 2018-09-04 DIAGNOSIS — I83018 Varicose veins of right lower extremity with ulcer other part of lower leg: Secondary | ICD-10-CM | POA: Diagnosis not present

## 2018-09-04 DIAGNOSIS — L03115 Cellulitis of right lower limb: Secondary | ICD-10-CM | POA: Diagnosis not present

## 2018-09-04 DIAGNOSIS — I83028 Varicose veins of left lower extremity with ulcer other part of lower leg: Secondary | ICD-10-CM | POA: Diagnosis not present

## 2018-09-04 DIAGNOSIS — L97811 Non-pressure chronic ulcer of other part of right lower leg limited to breakdown of skin: Secondary | ICD-10-CM | POA: Diagnosis not present

## 2018-09-04 DIAGNOSIS — L97821 Non-pressure chronic ulcer of other part of left lower leg limited to breakdown of skin: Secondary | ICD-10-CM | POA: Diagnosis not present

## 2018-09-04 NOTE — Telephone Encounter (Signed)
Copied from [place] [id number]. Topic: Quick Communication - See Telephone Encounter >> Sep 04, 2018  2:41 PM Hewitt Shorts wrote: Tillie Rung with [facility] is callling to get verbal orders extension for 1 time a week for 1 week , then 2 times a week for 4 weeks and 1 time a week for 3 weeks   Best number is [phone]

## 2018-09-05 DIAGNOSIS — L97821 Non-pressure chronic ulcer of other part of left lower leg limited to breakdown of skin: Secondary | ICD-10-CM | POA: Diagnosis not present

## 2018-09-05 DIAGNOSIS — L03115 Cellulitis of right lower limb: Secondary | ICD-10-CM | POA: Diagnosis not present

## 2018-09-05 DIAGNOSIS — I83018 Varicose veins of right lower extremity with ulcer other part of lower leg: Secondary | ICD-10-CM | POA: Diagnosis not present

## 2018-09-05 DIAGNOSIS — L97811 Non-pressure chronic ulcer of other part of right lower leg limited to breakdown of skin: Secondary | ICD-10-CM | POA: Diagnosis not present

## 2018-09-05 DIAGNOSIS — I129 Hypertensive chronic kidney disease with stage 1 through stage 4 chronic kidney disease, or unspecified chronic kidney disease: Secondary | ICD-10-CM | POA: Diagnosis not present

## 2018-09-05 DIAGNOSIS — I83028 Varicose veins of left lower extremity with ulcer other part of lower leg: Secondary | ICD-10-CM | POA: Diagnosis not present

## 2018-09-05 DIAGNOSIS — L03116 Cellulitis of left lower limb: Secondary | ICD-10-CM | POA: Diagnosis not present

## 2018-09-08 DIAGNOSIS — I83028 Varicose veins of left lower extremity with ulcer other part of lower leg: Secondary | ICD-10-CM | POA: Diagnosis not present

## 2018-09-08 DIAGNOSIS — L03116 Cellulitis of left lower limb: Secondary | ICD-10-CM | POA: Diagnosis not present

## 2018-09-08 DIAGNOSIS — L97811 Non-pressure chronic ulcer of other part of right lower leg limited to breakdown of skin: Secondary | ICD-10-CM | POA: Diagnosis not present

## 2018-09-08 DIAGNOSIS — I83018 Varicose veins of right lower extremity with ulcer other part of lower leg: Secondary | ICD-10-CM | POA: Diagnosis not present

## 2018-09-08 DIAGNOSIS — L03115 Cellulitis of right lower limb: Secondary | ICD-10-CM | POA: Diagnosis not present

## 2018-09-08 DIAGNOSIS — L97821 Non-pressure chronic ulcer of other part of left lower leg limited to breakdown of skin: Secondary | ICD-10-CM | POA: Diagnosis not present

## 2018-09-08 NOTE — Telephone Encounter (Signed)
Verbal given 

## 2018-09-09 ENCOUNTER — Telehealth: Payer: Self-pay | Admitting: Family Medicine

## 2018-09-09 NOTE — Telephone Encounter (Signed)
Copied from [place] [id number]. Topic: Quick Communication - See Telephone Encounter >> Sep 09, 2018  4:37 PM Antonieta Iba C wrote: CRM for notification. See Telephone encounter for: 09/09/18.  Pt states that she is returning call to the office. Not showing a note in chart.

## 2018-09-10 ENCOUNTER — Telehealth: Payer: Self-pay | Admitting: Cardiology

## 2018-09-10 ENCOUNTER — Encounter: Payer: Medicare Other | Admitting: *Deleted

## 2018-09-10 DIAGNOSIS — L97811 Non-pressure chronic ulcer of other part of right lower leg limited to breakdown of skin: Secondary | ICD-10-CM | POA: Diagnosis not present

## 2018-09-10 DIAGNOSIS — L97821 Non-pressure chronic ulcer of other part of left lower leg limited to breakdown of skin: Secondary | ICD-10-CM | POA: Diagnosis not present

## 2018-09-10 DIAGNOSIS — I83018 Varicose veins of right lower extremity with ulcer other part of lower leg: Secondary | ICD-10-CM | POA: Diagnosis not present

## 2018-09-10 DIAGNOSIS — I83028 Varicose veins of left lower extremity with ulcer other part of lower leg: Secondary | ICD-10-CM | POA: Diagnosis not present

## 2018-09-10 DIAGNOSIS — L03116 Cellulitis of left lower limb: Secondary | ICD-10-CM | POA: Diagnosis not present

## 2018-09-10 DIAGNOSIS — L03115 Cellulitis of right lower limb: Secondary | ICD-10-CM | POA: Diagnosis not present

## 2018-09-10 NOTE — Telephone Encounter (Signed)
Confirmed remote transmission w/ pt wife.   

## 2018-09-11 ENCOUNTER — Ambulatory Visit (INDEPENDENT_AMBULATORY_CARE_PROVIDER_SITE_OTHER): Payer: Medicare Other | Admitting: Family Medicine

## 2018-09-11 ENCOUNTER — Encounter: Payer: Self-pay | Admitting: Family Medicine

## 2018-09-11 ENCOUNTER — Encounter: Payer: Self-pay | Admitting: Cardiology

## 2018-09-11 ENCOUNTER — Other Ambulatory Visit: Payer: Self-pay

## 2018-09-11 VITALS — BP 126/85 | HR 71 | Temp 98.0°F | Ht 67.0 in | Wt 282.2 lb

## 2018-09-11 DIAGNOSIS — I5042 Chronic combined systolic (congestive) and diastolic (congestive) heart failure: Secondary | ICD-10-CM

## 2018-09-11 DIAGNOSIS — N183 Chronic kidney disease, stage 3 unspecified: Secondary | ICD-10-CM

## 2018-09-11 DIAGNOSIS — Z23 Encounter for immunization: Secondary | ICD-10-CM | POA: Diagnosis not present

## 2018-09-11 DIAGNOSIS — I83028 Varicose veins of left lower extremity with ulcer other part of lower leg: Secondary | ICD-10-CM | POA: Diagnosis not present

## 2018-09-11 DIAGNOSIS — L97209 Non-pressure chronic ulcer of unspecified calf with unspecified severity: Secondary | ICD-10-CM | POA: Diagnosis not present

## 2018-09-11 DIAGNOSIS — E079 Disorder of thyroid, unspecified: Secondary | ICD-10-CM | POA: Diagnosis not present

## 2018-09-11 DIAGNOSIS — D649 Anemia, unspecified: Secondary | ICD-10-CM

## 2018-09-11 DIAGNOSIS — I83002 Varicose veins of unspecified lower extremity with ulcer of calf: Secondary | ICD-10-CM | POA: Diagnosis not present

## 2018-09-11 DIAGNOSIS — L97811 Non-pressure chronic ulcer of other part of right lower leg limited to breakdown of skin: Secondary | ICD-10-CM | POA: Diagnosis not present

## 2018-09-11 DIAGNOSIS — L03115 Cellulitis of right lower limb: Secondary | ICD-10-CM | POA: Diagnosis not present

## 2018-09-11 DIAGNOSIS — I83018 Varicose veins of right lower extremity with ulcer other part of lower leg: Secondary | ICD-10-CM | POA: Diagnosis not present

## 2018-09-11 DIAGNOSIS — L97821 Non-pressure chronic ulcer of other part of left lower leg limited to breakdown of skin: Secondary | ICD-10-CM | POA: Diagnosis not present

## 2018-09-11 DIAGNOSIS — L03116 Cellulitis of left lower limb: Secondary | ICD-10-CM | POA: Diagnosis not present

## 2018-09-11 MED ORDER — DOXYCYCLINE HYCLATE 100 MG PO TABS: 100.0000 mg | ORAL_TABLET | Freq: Two times a day (BID) | ORAL | 0 refills | Status: DC

## 2018-09-11 MED ORDER — HYDROCODONE-ACETAMINOPHEN 5-325 MG PO TABS: 1.0000 | ORAL_TABLET | Freq: Four times a day (QID) | ORAL | 0 refills | Status: DC | PRN

## 2018-09-11 NOTE — Progress Notes (Signed)
9/19/20194:40 PM  Kari [name] 09/16/1961, 57 y.o. female [id number]  Chief Complaint  Patient presents with  . Medication Refill    needing norco and antibiotic medication for the leg due to the smell of wound from the leg  . flu shot    wanting to know if she is able to take the flu shot today being the condition of her kidneys    HPI:   Patient is a 57 y.o. female with past medical history significant for s/p cardiac arrest in 2018 now with ACID, CHF, CKD, venous stasis ulcerswho presents today for follow-up  No showed appt with wound care - she does not want to go at this time [facility] still involved, changed to silverdene, compressions placed this morning Feels that there is a faint smell when she takes off bandages.  However she feels that overall she is improving since changed to silvadene Walking a lot better  Uses cane Requesting refill of vicodin, takes prn, pmp reviewed Sees renal, changed to uloric and started lasix 40mg  daily She also reports that they stopped KCL, and started vitamin D, iron and salt tabs Lisinopril stopped after having recurrent hypotension Has no upcoming appt with cardiology  Fall Risk  09/11/2018 05/10/2018 03/24/2018 03/04/2018 02/06/2018  Falls in the past year? No Yes Yes No No  Number falls in past yr: - 2 or more 1 - -  Comment - - patient stepped into hole outside apartment - -  Injury with Fall? - No Yes - -  Comment - - soft tissue injury resolved without intervention - -  Risk for fall due to : - - History of fall(s);Impaired mobility - -  Risk for fall due to: Comment - - - - -  Follow up - - Falls prevention discussed - -     Depression screen [facility] 2/9 09/11/2018 05/10/2018 03/24/2018  Decreased Interest 0 0 0  Down, Depressed, Hopeless 0 0 0  PHQ - 2 Score 0 0 0  Altered sleeping - - -  Tired, decreased energy - - -  Change in appetite - - -  Feeling bad or failure about yourself  - - -  Trouble concentrating - - -  Moving  slowly or fidgety/restless - - -  Suicidal thoughts - - -  PHQ-9 Score - - -  Difficult doing work/chores - - -    Allergies  Allergen Reactions  . Tramadol     hallucination    Prior to Admission medications   Medication Sig Start Date End Date Taking? Authorizing Provider  albuterol (PROVENTIL HFA;VENTOLIN HFA) 108 (90 Base) MCG/ACT inhaler Inhale 2 puffs into the lungs every 6 (six) hours as needed for shortness of breath. 07/06/17  Yes Mikhail, Velta Addison, DO  aspirin EC 81 MG tablet Take 81 mg by mouth daily.   Yes [provider]  carvedilol (COREG) 6.25 MG tablet Take 6.25 mg by mouth 2 (two) times daily. 09/04/18  Yes [provider]  famotidine (PEPCID) 20 MG tablet Take 1 tablet (20 mg total) by mouth daily. 02/27/18  Yes Shelly Coss, MD  febuxostat (ULORIC) 40 MG tablet Take 40 mg by mouth daily. 09/04/18  Yes [provider]  furosemide (LASIX) 40 MG tablet Take 40 mg by mouth daily. 08/22/18  Yes [provider]  HYDROcodone-acetaminophen (NORCO/VICODIN) 5-325 MG tablet Take 1 tablet by mouth every 6 (six) hours as needed for moderate pain. 05/10/18  Yes Rutherford Guys, MD  loratadine (CLARITIN) 10  MG tablet Take 10 mg by mouth daily.   Yes [provider]  ondansetron (ZOFRAN) 4 MG tablet Take 1 tablet (4 mg total) by mouth every 6 (six) hours as needed for nausea. 04/06/17  Yes Sinda Du, MD  SANTYL ointment APPLY TO WOUND AS DIRECTED PER BLE TREATMENT. 07/09/18  Yes Rutherford Guys, MD  SPS 15 GM/60ML suspension TAKE AS DIRECTED AS A 1 TIME DOSE 07/17/18  Yes [provider]  Vitamin D, Ergocalciferol, (DRISDOL) 50000 units CAPS capsule TAKE ONE CAPSULE BY MOUTH EVERY WEEK FOR EIGHT WEEKS AND THEN ONE CAPSULE EVERY MONTH THEREAFTER 08/19/18  Yes [provider]  lisinopril (PRINIVIL,ZESTRIL) 2.5 MG tablet Take 1 tablet (2.5 mg total) by mouth daily. 12/26/17 03/26/18  Strader, Fransisco Hertz, PA-C  potassium chloride SA  (KLOR-CON M20) 20 MEQ tablet Take 1 tablet (20 mEq total) by mouth daily for 14 days. 02/26/18 03/12/18  Shelly Coss, MD  torsemide (DEMADEX) 20 MG tablet Take 1 tablet (20 mg total) by mouth daily. 05/10/18 08/08/18  Rutherford Guys, MD    Past Medical History:  Diagnosis Date  . Asthma   . CHF (congestive heart failure) ([place]) 03/12/2014   a. EF 40-45% by echo in 06/2017 with normal cors by cath. ICD placed following VT arrest  . Depression   . Headache(784.0)   . Hyperlipidemia   . Hypertension   . Lung nodules   . Renal disorder   . Sarcoidosis   . Ulcer    recurring, from chronic venous insufficiency    Past Surgical History:  Procedure Laterality Date  . cataract surgery Left 07-2013  . ICD IMPLANT N/A 07/05/2017   Procedure: ICD Implant;  Surgeon: Constance Haw, MD;  Location: [facility];  Service: Cardiovascular;  Laterality: N/A;  . LEFT HEART CATH AND CORONARY ANGIOGRAPHY N/A 07/03/2017   Procedure: Left Heart Cath and Coronary Angiography;  Surgeon: Belva Crome, MD;  Location: [facility];  Service: Cardiovascular;  Laterality: N/A;    Social History   Tobacco Use  . Smoking status: Never Smoker  . Smokeless tobacco: Never Used  Substance Use Topics  . Alcohol use: No    Family History  Problem Relation Age of Onset  . Heart failure Mother   . Diabetes Mellitus II Mother   . Hypertension Mother   . Cancer Mother        unknown type  . Heart disease Father   . Stroke Father   . Diabetes Mellitus II Father     Review of Systems  Constitutional: Negative for chills and fever.  Respiratory: Negative for cough and shortness of breath.   Cardiovascular: Positive for leg swelling. Negative for chest pain and palpitations.  Gastrointestinal: Negative for abdominal pain, constipation, nausea and vomiting.  Musculoskeletal: Positive for joint pain.     OBJECTIVE:  Blood pressure 126/85, pulse 71, temperature 98 F (36.7 C),  temperature source Oral, height 5\' 7"  (1.702 m), weight 282 lb 3.2 oz (128 kg), last menstrual period 11/21/2011, SpO2 97 %. Body mass index is 44.2 kg/m.   Wt Readings from Last 3 Encounters:  09/11/18 282 lb 3.2 oz (128 kg)  05/10/18 (!) 300 lb 9.6 oz (136.4 kg)  04/07/18 (!) 303 lb 9.6 oz (137.7 kg)    Physical Exam  Constitutional: She is oriented to person, place, and time. She appears well-developed and well-nourished.  HENT:  Head: Normocephalic and atraumatic.  Mouth/Throat: Oropharynx is clear and moist.  No oropharyngeal exudate.  Eyes: Pupils are equal, round, and reactive to light. Conjunctivae and EOM are normal. No scleral icterus.  Neck: Neck supple.  Cardiovascular: Normal rate and regular rhythm. Exam reveals no gallop and no friction rub.  Murmur heard. Pulmonary/Chest: Effort normal and breath sounds normal. She has no wheezes. She has no rales.  Musculoskeletal: She exhibits edema.  Legs wrapped in clean compression bandages upto knees  Neurological: She is alert and oriented to person, place, and time.  Skin: Skin is warm and dry.  Psychiatric: She has a normal mood and affect.  Nursing note and vitals reviewed.   ASSESSMENT and PLAN  1. Venous stasis ulcer of calf, unspecified laterality, unspecified ulcer stage, unspecified whether varicose veins present [facility]) Per patient improving with recent changes, she declines outpatient wound care. rx for doxy given concerns for odor. I have called [place] RN to further discuss.  2. Chronic combined systolic and diastolic congestive heart failure (HCC) Stable. Cont current regime - Comprehensive metabolic panel - Lipid panel  3. Stage 3 chronic kidney disease ([place]) Managed by renal.  - CBC with Differential/Platelet - Comprehensive metabolic panel - Lipid panel  4. Anemia, unspecified type On iron, CKD contributing factor. Managed by renal - CBC with Differential/Platelet  5. Thyroid disorder - TSH  6. Need  for immunization against influenza - Flu Vaccine QUAD 36+ mos IM  Other orders - Vitamin D, Ergocalciferol, (DRISDOL) 50000 units CAPS capsule; TAKE ONE CAPSULE BY MOUTH EVERY WEEK FOR EIGHT WEEKS AND THEN ONE CAPSULE EVERY MONTH THEREAFTER - furosemide (LASIX) 40 MG tablet; Take 40 mg by mouth daily. - carvedilol (COREG) 6.25 MG tablet; Take 6.25 mg by mouth 2 (two) times daily. - SPS 15 GM/60ML suspension; TAKE AS DIRECTED AS A 1 TIME DOSE - febuxostat (ULORIC) 40 MG tablet; Take 40 mg by mouth daily. - doxycycline (VIBRA-TABS) 100 MG tablet; Take 1 tablet (100 mg total) by mouth 2 (two) times daily. - HYDROcodone-acetaminophen (NORCO/VICODIN) 5-325 MG tablet; Take 1 tablet by mouth every 6 (six) hours as needed for moderate pain.  Return in about 3 months (around 12/11/2018).    Rutherford Guys, MD Primary Care at [facility], [place] [address] Ph.  [phone] Fax [phone]

## 2018-09-11 NOTE — Patient Instructions (Signed)
° ° ° °  If you have lab work done today you will be contacted with your lab results within the next 2 weeks.  If you have not heard from us then please contact us. The fastest way to get your results is to register for My Chart. ° ° °IF you received an x-ray today, you will receive an invoice from [facility]. Please contact [facility] at [phone] with questions or concerns regarding your invoice.  ° °IF you received labwork today, you will receive an invoice from [facility]. Please contact [facility] at [phone] with questions or concerns regarding your invoice.  ° °Our billing staff will not be able to assist you with questions regarding bills from these companies. ° °You will be contacted with the lab results as soon as they are available. The fastest way to get your results is to activate your My Chart account. Instructions are located on the last page of this paperwork. If you have not heard from us regarding the results in 2 weeks, please contact this office. °  ° ° ° °

## 2018-09-12 LAB — COMPREHENSIVE METABOLIC PANEL
ALT: 6 IU/L (ref 0–32)
AST: 14 IU/L (ref 0–40)
Albumin/Globulin Ratio: 0.8 — ABNORMAL LOW (ref 1.2–2.2)
Albumin: 3.4 g/dL — ABNORMAL LOW (ref 3.5–5.5)
Alkaline Phosphatase: 85 IU/L (ref 39–117)
BUN/Creatinine Ratio: 13 (ref 9–23)
BUN: 18 mg/dL (ref 6–24)
Bilirubin Total: 0.4 mg/dL (ref 0.0–1.2)
CO2: 25 mmol/L (ref 20–29)
Calcium: 9.2 mg/dL (ref 8.7–10.2)
Chloride: 99 mmol/L (ref 96–106)
Creatinine, Ser: 1.37 mg/dL — ABNORMAL HIGH (ref 0.57–1.00)
GFR calc Af Amer: 50 mL/min/{1.73_m2} — ABNORMAL LOW (ref >59)
GFR calc non Af Amer: 43 mL/min/{1.73_m2} — ABNORMAL LOW (ref >59)
Globulin, Total: 4.5 g/dL (ref 1.5–4.5)
Glucose: 97 mg/dL (ref 65–99)
Potassium: 4 mmol/L (ref 3.5–5.2)
Sodium: 138 mmol/L (ref 134–144)
Total Protein: 7.9 g/dL (ref 6.0–8.5)

## 2018-09-12 LAB — CBC WITH DIFFERENTIAL/PLATELET
Basophils Absolute: 0 10*3/uL (ref 0.0–0.2)
Basos: 1 %
EOS (ABSOLUTE): 0.3 10*3/uL (ref 0.0–0.4)
Eos: 4 %
Hematocrit: 29.9 % — ABNORMAL LOW (ref 34.0–46.6)
Hemoglobin: 9.5 g/dL — ABNORMAL LOW (ref 11.1–15.9)
Immature Grans (Abs): 0.1 10*3/uL (ref 0.0–0.1)
Immature Granulocytes: 1 %
Lymphocytes Absolute: 1 10*3/uL (ref 0.7–3.1)
Lymphs: 17 %
MCH: 28.2 pg (ref 26.6–33.0)
MCHC: 31.8 g/dL (ref 31.5–35.7)
MCV: 89 fL (ref 79–97)
Monocytes Absolute: 0.8 10*3/uL (ref 0.1–0.9)
Monocytes: 14 %
Neutrophils Absolute: 3.8 10*3/uL (ref 1.4–7.0)
Neutrophils: 63 %
Platelets: 418 10*3/uL (ref 150–450)
RBC: 3.37 x10E6/uL — ABNORMAL LOW (ref 3.77–5.28)
RDW: 14.7 % (ref 12.3–15.4)
WBC: 5.9 10*3/uL (ref 3.4–10.8)

## 2018-09-12 LAB — LIPID PANEL
Chol/HDL Ratio: 4.2 ratio (ref 0.0–4.4)
Cholesterol, Total: 169 mg/dL (ref 100–199)
HDL: 40 mg/dL (ref >39)
LDL Calculated: 96 mg/dL (ref 0–99)
Triglycerides: 167 mg/dL — ABNORMAL HIGH (ref 0–149)
VLDL Cholesterol Cal: 33 mg/dL (ref 5–40)

## 2018-09-12 LAB — TSH: TSH: 2.43 u[IU]/mL (ref 0.450–4.500)

## 2018-09-15 ENCOUNTER — Encounter: Payer: Self-pay | Admitting: Family Medicine

## 2018-09-15 DIAGNOSIS — L03116 Cellulitis of left lower limb: Secondary | ICD-10-CM | POA: Diagnosis not present

## 2018-09-15 DIAGNOSIS — L97811 Non-pressure chronic ulcer of other part of right lower leg limited to breakdown of skin: Secondary | ICD-10-CM | POA: Diagnosis not present

## 2018-09-15 DIAGNOSIS — I83018 Varicose veins of right lower extremity with ulcer other part of lower leg: Secondary | ICD-10-CM | POA: Diagnosis not present

## 2018-09-15 DIAGNOSIS — I83028 Varicose veins of left lower extremity with ulcer other part of lower leg: Secondary | ICD-10-CM | POA: Diagnosis not present

## 2018-09-15 DIAGNOSIS — I70213 Atherosclerosis of native arteries of extremities with intermittent claudication, bilateral legs: Secondary | ICD-10-CM | POA: Diagnosis not present

## 2018-09-15 DIAGNOSIS — L03115 Cellulitis of right lower limb: Secondary | ICD-10-CM | POA: Diagnosis not present

## 2018-09-15 DIAGNOSIS — L97821 Non-pressure chronic ulcer of other part of left lower leg limited to breakdown of skin: Secondary | ICD-10-CM | POA: Diagnosis not present

## 2018-09-17 DIAGNOSIS — I83028 Varicose veins of left lower extremity with ulcer other part of lower leg: Secondary | ICD-10-CM | POA: Diagnosis not present

## 2018-09-17 DIAGNOSIS — I83018 Varicose veins of right lower extremity with ulcer other part of lower leg: Secondary | ICD-10-CM | POA: Diagnosis not present

## 2018-09-17 DIAGNOSIS — L03116 Cellulitis of left lower limb: Secondary | ICD-10-CM | POA: Diagnosis not present

## 2018-09-17 DIAGNOSIS — L97811 Non-pressure chronic ulcer of other part of right lower leg limited to breakdown of skin: Secondary | ICD-10-CM | POA: Diagnosis not present

## 2018-09-17 DIAGNOSIS — L97821 Non-pressure chronic ulcer of other part of left lower leg limited to breakdown of skin: Secondary | ICD-10-CM | POA: Diagnosis not present

## 2018-09-17 DIAGNOSIS — L03115 Cellulitis of right lower limb: Secondary | ICD-10-CM | POA: Diagnosis not present

## 2018-09-22 ENCOUNTER — Other Ambulatory Visit: Payer: Self-pay | Admitting: Family Medicine

## 2018-09-22 DIAGNOSIS — N183 Chronic kidney disease, stage 3 unspecified: Secondary | ICD-10-CM

## 2018-09-22 DIAGNOSIS — I872 Venous insufficiency (chronic) (peripheral): Secondary | ICD-10-CM

## 2018-09-22 DIAGNOSIS — L97209 Non-pressure chronic ulcer of unspecified calf with unspecified severity: Secondary | ICD-10-CM

## 2018-09-22 DIAGNOSIS — I83002 Varicose veins of unspecified lower extremity with ulcer of calf: Secondary | ICD-10-CM

## 2018-09-22 DIAGNOSIS — I5042 Chronic combined systolic (congestive) and diastolic (congestive) heart failure: Secondary | ICD-10-CM

## 2018-09-22 NOTE — Telephone Encounter (Signed)
Patient is requesting a refill of the antibiotics. She is to change dressing twice weekly- [place] comes to her home. Patient reports the medication helped with the smell so she feels that there is improvement. The nurse is coming tomorrow to change the dressing- she will have them send in a picture so she can see the process. Patient states her pain is better- she is just requesting the antibiotic only. Patient can be reached at her home number for any questions.  Refill request: Doxycycline 100 mg    Last filled: 09/11/18  LOV: 09/11/18  PCP: [place]: verified

## 2018-09-22 NOTE — Telephone Encounter (Signed)
Please advise on refill of Antibiotics. Does the pt have to be seen in the office?

## 2018-09-22 NOTE — Telephone Encounter (Unsigned)
Copied from [place] [id number]. Topic: Quick Communication - Rx Refill/Question >> Sep 22, 2018  1:30 PM [place], [place] A wrote: Medication: doxycycline (VIBRA-TABS) 100 MG tablet [[id number]]  Has the patient contacted their pharmacy? No  (Agent: If no, request that the patient contact the pharmacy for the refill.) (Agent: If yes, when and what did the pharmacy advise?)  Preferred Pharmacy (with phone number or street name): [place], [place] - [place] [phone] (Phone)   Agent: Please be advised that RX refills may take up to 3 business days. We ask that you follow-up with your pharmacy.

## 2018-09-23 ENCOUNTER — Other Ambulatory Visit: Payer: Self-pay | Admitting: Family Medicine

## 2018-09-23 DIAGNOSIS — L03116 Cellulitis of left lower limb: Secondary | ICD-10-CM | POA: Diagnosis not present

## 2018-09-23 DIAGNOSIS — L03115 Cellulitis of right lower limb: Secondary | ICD-10-CM | POA: Diagnosis not present

## 2018-09-23 DIAGNOSIS — I83028 Varicose veins of left lower extremity with ulcer other part of lower leg: Secondary | ICD-10-CM | POA: Diagnosis not present

## 2018-09-23 DIAGNOSIS — L97821 Non-pressure chronic ulcer of other part of left lower leg limited to breakdown of skin: Secondary | ICD-10-CM | POA: Diagnosis not present

## 2018-09-23 DIAGNOSIS — I83018 Varicose veins of right lower extremity with ulcer other part of lower leg: Secondary | ICD-10-CM | POA: Diagnosis not present

## 2018-09-23 DIAGNOSIS — L97811 Non-pressure chronic ulcer of other part of right lower leg limited to breakdown of skin: Secondary | ICD-10-CM | POA: Diagnosis not present

## 2018-09-24 ENCOUNTER — Telehealth: Payer: Self-pay | Admitting: Family Medicine

## 2018-09-24 DIAGNOSIS — L97811 Non-pressure chronic ulcer of other part of right lower leg limited to breakdown of skin: Secondary | ICD-10-CM | POA: Diagnosis not present

## 2018-09-24 DIAGNOSIS — L97821 Non-pressure chronic ulcer of other part of left lower leg limited to breakdown of skin: Secondary | ICD-10-CM | POA: Diagnosis not present

## 2018-09-24 DIAGNOSIS — I83028 Varicose veins of left lower extremity with ulcer other part of lower leg: Secondary | ICD-10-CM | POA: Diagnosis not present

## 2018-09-24 DIAGNOSIS — L03116 Cellulitis of left lower limb: Secondary | ICD-10-CM | POA: Diagnosis not present

## 2018-09-24 DIAGNOSIS — L03115 Cellulitis of right lower limb: Secondary | ICD-10-CM | POA: Diagnosis not present

## 2018-09-24 DIAGNOSIS — I83018 Varicose veins of right lower extremity with ulcer other part of lower leg: Secondary | ICD-10-CM | POA: Diagnosis not present

## 2018-09-24 NOTE — Telephone Encounter (Signed)
Requested Prescriptions  Pending Prescriptions Disp Refills   doxycycline (VIBRAMYCIN) 100 MG capsule [Pharmacy Med Name: DOXYCYCLINE HYCLATE 100MG  CAPSULE] 20 capsule     Sig: TAKE ONE CAPSULE BY MOUTH TWICE DAILY     Off-Protocol Failed - 09/23/2018  3:59 PM      Failed - Medication not assigned to a protocol, review manually.      Passed - Valid encounter within last 12 months    Recent Outpatient Visits          1 week ago Venous stasis ulcer of calf, unspecified laterality, unspecified ulcer stage, unspecified whether varicose veins present [facility])   Primary Care at [facility], Lilia Argue, MD   4 months ago Venous stasis ulcer of calf, unspecified laterality, unspecified ulcer stage, unspecified whether varicose veins present [facility])   [facility] at [facility], Lilia Argue, MD   6 months ago Venous (peripheral) insufficiency   Primary Care at [facility], Lilia Argue, MD   6 months ago Encounter for support and coordination of transition of care   Primary Care at [facility], Lilia Argue, MD      Future Appointments            In 2 months Rutherford Guys, MD Primary Care at [place], [facility]

## 2018-09-24 NOTE — Telephone Encounter (Unsigned)
Copied from [place] [id number]. Topic: General - Other >> Sep 24, 2018 10:20 AM Carolyn Stare wrote:  Tillie Rung a PT with [facility] is asking for a order for telehealth to monitor her weight

## 2018-09-24 NOTE — Telephone Encounter (Signed)
Message from pt req telehealth to monitor weight sent to Dr. [name].

## 2018-09-25 DIAGNOSIS — I83018 Varicose veins of right lower extremity with ulcer other part of lower leg: Secondary | ICD-10-CM | POA: Diagnosis not present

## 2018-09-25 DIAGNOSIS — L03116 Cellulitis of left lower limb: Secondary | ICD-10-CM | POA: Diagnosis not present

## 2018-09-25 DIAGNOSIS — L03115 Cellulitis of right lower limb: Secondary | ICD-10-CM | POA: Diagnosis not present

## 2018-09-25 DIAGNOSIS — I83028 Varicose veins of left lower extremity with ulcer other part of lower leg: Secondary | ICD-10-CM | POA: Diagnosis not present

## 2018-09-25 DIAGNOSIS — L97811 Non-pressure chronic ulcer of other part of right lower leg limited to breakdown of skin: Secondary | ICD-10-CM | POA: Diagnosis not present

## 2018-09-25 DIAGNOSIS — L97821 Non-pressure chronic ulcer of other part of left lower leg limited to breakdown of skin: Secondary | ICD-10-CM | POA: Diagnosis not present

## 2018-09-25 NOTE — Telephone Encounter (Signed)
Please call back to provide verbal approving weight monitoring thru telehealth.

## 2018-09-26 NOTE — Telephone Encounter (Signed)
Left message on voicemail at [phone] for Tillie Rung at [facility] giving verbal approval for weight monitoring thru telehealth.  Advised if verbal approval is not acceptable to send form to office for signature or if she has further questions or concerns to call office. Dgaddy, CMA

## 2018-09-27 DIAGNOSIS — L97811 Non-pressure chronic ulcer of other part of right lower leg limited to breakdown of skin: Secondary | ICD-10-CM | POA: Diagnosis not present

## 2018-09-27 DIAGNOSIS — I83028 Varicose veins of left lower extremity with ulcer other part of lower leg: Secondary | ICD-10-CM | POA: Diagnosis not present

## 2018-09-27 DIAGNOSIS — L03115 Cellulitis of right lower limb: Secondary | ICD-10-CM | POA: Diagnosis not present

## 2018-09-27 DIAGNOSIS — L03116 Cellulitis of left lower limb: Secondary | ICD-10-CM | POA: Diagnosis not present

## 2018-09-27 DIAGNOSIS — L97821 Non-pressure chronic ulcer of other part of left lower leg limited to breakdown of skin: Secondary | ICD-10-CM | POA: Diagnosis not present

## 2018-09-27 DIAGNOSIS — I83018 Varicose veins of right lower extremity with ulcer other part of lower leg: Secondary | ICD-10-CM | POA: Diagnosis not present

## 2018-09-27 MED ORDER — DOXYCYCLINE HYCLATE 100 MG PO TABS: 100.0000 mg | ORAL_TABLET | Freq: Two times a day (BID) | ORAL | 0 refills | Status: DC

## 2018-09-27 NOTE — Telephone Encounter (Signed)
I have called [place] nurse twice and not received returned phone call. I have not seen her wounds of recent. I will refill one more time but she needs to see outpatient wound care as I am not being able to safely manage her progression.  Another referral made. Thanks

## 2018-09-29 DIAGNOSIS — L97821 Non-pressure chronic ulcer of other part of left lower leg limited to breakdown of skin: Secondary | ICD-10-CM | POA: Diagnosis not present

## 2018-09-29 DIAGNOSIS — I83018 Varicose veins of right lower extremity with ulcer other part of lower leg: Secondary | ICD-10-CM | POA: Diagnosis not present

## 2018-09-29 DIAGNOSIS — I83028 Varicose veins of left lower extremity with ulcer other part of lower leg: Secondary | ICD-10-CM | POA: Diagnosis not present

## 2018-09-29 DIAGNOSIS — L03115 Cellulitis of right lower limb: Secondary | ICD-10-CM | POA: Diagnosis not present

## 2018-09-29 DIAGNOSIS — L03116 Cellulitis of left lower limb: Secondary | ICD-10-CM | POA: Diagnosis not present

## 2018-09-29 DIAGNOSIS — L97811 Non-pressure chronic ulcer of other part of right lower leg limited to breakdown of skin: Secondary | ICD-10-CM | POA: Diagnosis not present

## 2018-09-30 DIAGNOSIS — L03116 Cellulitis of left lower limb: Secondary | ICD-10-CM | POA: Diagnosis not present

## 2018-09-30 DIAGNOSIS — L97821 Non-pressure chronic ulcer of other part of left lower leg limited to breakdown of skin: Secondary | ICD-10-CM | POA: Diagnosis not present

## 2018-09-30 DIAGNOSIS — L97811 Non-pressure chronic ulcer of other part of right lower leg limited to breakdown of skin: Secondary | ICD-10-CM | POA: Diagnosis not present

## 2018-09-30 DIAGNOSIS — I83028 Varicose veins of left lower extremity with ulcer other part of lower leg: Secondary | ICD-10-CM | POA: Diagnosis not present

## 2018-09-30 DIAGNOSIS — L03115 Cellulitis of right lower limb: Secondary | ICD-10-CM | POA: Diagnosis not present

## 2018-09-30 DIAGNOSIS — I83018 Varicose veins of right lower extremity with ulcer other part of lower leg: Secondary | ICD-10-CM | POA: Diagnosis not present

## 2018-10-01 DIAGNOSIS — L97821 Non-pressure chronic ulcer of other part of left lower leg limited to breakdown of skin: Secondary | ICD-10-CM | POA: Diagnosis not present

## 2018-10-01 DIAGNOSIS — I83028 Varicose veins of left lower extremity with ulcer other part of lower leg: Secondary | ICD-10-CM | POA: Diagnosis not present

## 2018-10-01 DIAGNOSIS — I83018 Varicose veins of right lower extremity with ulcer other part of lower leg: Secondary | ICD-10-CM | POA: Diagnosis not present

## 2018-10-01 DIAGNOSIS — L03116 Cellulitis of left lower limb: Secondary | ICD-10-CM | POA: Diagnosis not present

## 2018-10-01 DIAGNOSIS — L03115 Cellulitis of right lower limb: Secondary | ICD-10-CM | POA: Diagnosis not present

## 2018-10-01 DIAGNOSIS — L97811 Non-pressure chronic ulcer of other part of right lower leg limited to breakdown of skin: Secondary | ICD-10-CM | POA: Diagnosis not present

## 2018-10-03 DIAGNOSIS — L03116 Cellulitis of left lower limb: Secondary | ICD-10-CM | POA: Diagnosis not present

## 2018-10-03 DIAGNOSIS — I83018 Varicose veins of right lower extremity with ulcer other part of lower leg: Secondary | ICD-10-CM | POA: Diagnosis not present

## 2018-10-03 DIAGNOSIS — L97811 Non-pressure chronic ulcer of other part of right lower leg limited to breakdown of skin: Secondary | ICD-10-CM | POA: Diagnosis not present

## 2018-10-03 DIAGNOSIS — I83028 Varicose veins of left lower extremity with ulcer other part of lower leg: Secondary | ICD-10-CM | POA: Diagnosis not present

## 2018-10-03 DIAGNOSIS — L97821 Non-pressure chronic ulcer of other part of left lower leg limited to breakdown of skin: Secondary | ICD-10-CM | POA: Diagnosis not present

## 2018-10-03 DIAGNOSIS — L03115 Cellulitis of right lower limb: Secondary | ICD-10-CM | POA: Diagnosis not present

## 2018-10-06 DIAGNOSIS — I83018 Varicose veins of right lower extremity with ulcer other part of lower leg: Secondary | ICD-10-CM | POA: Diagnosis not present

## 2018-10-06 DIAGNOSIS — L03116 Cellulitis of left lower limb: Secondary | ICD-10-CM | POA: Diagnosis not present

## 2018-10-06 DIAGNOSIS — L97821 Non-pressure chronic ulcer of other part of left lower leg limited to breakdown of skin: Secondary | ICD-10-CM | POA: Diagnosis not present

## 2018-10-06 DIAGNOSIS — L97811 Non-pressure chronic ulcer of other part of right lower leg limited to breakdown of skin: Secondary | ICD-10-CM | POA: Diagnosis not present

## 2018-10-06 DIAGNOSIS — L03115 Cellulitis of right lower limb: Secondary | ICD-10-CM | POA: Diagnosis not present

## 2018-10-06 DIAGNOSIS — I83028 Varicose veins of left lower extremity with ulcer other part of lower leg: Secondary | ICD-10-CM | POA: Diagnosis not present

## 2018-10-07 DIAGNOSIS — I83018 Varicose veins of right lower extremity with ulcer other part of lower leg: Secondary | ICD-10-CM | POA: Diagnosis not present

## 2018-10-07 DIAGNOSIS — L97811 Non-pressure chronic ulcer of other part of right lower leg limited to breakdown of skin: Secondary | ICD-10-CM | POA: Diagnosis not present

## 2018-10-07 DIAGNOSIS — L03116 Cellulitis of left lower limb: Secondary | ICD-10-CM | POA: Diagnosis not present

## 2018-10-07 DIAGNOSIS — I83028 Varicose veins of left lower extremity with ulcer other part of lower leg: Secondary | ICD-10-CM | POA: Diagnosis not present

## 2018-10-07 DIAGNOSIS — L03115 Cellulitis of right lower limb: Secondary | ICD-10-CM | POA: Diagnosis not present

## 2018-10-07 DIAGNOSIS — L97821 Non-pressure chronic ulcer of other part of left lower leg limited to breakdown of skin: Secondary | ICD-10-CM | POA: Diagnosis not present

## 2018-10-10 DIAGNOSIS — L03115 Cellulitis of right lower limb: Secondary | ICD-10-CM | POA: Diagnosis not present

## 2018-10-10 DIAGNOSIS — L97811 Non-pressure chronic ulcer of other part of right lower leg limited to breakdown of skin: Secondary | ICD-10-CM | POA: Diagnosis not present

## 2018-10-10 DIAGNOSIS — I83018 Varicose veins of right lower extremity with ulcer other part of lower leg: Secondary | ICD-10-CM | POA: Diagnosis not present

## 2018-10-10 DIAGNOSIS — L03116 Cellulitis of left lower limb: Secondary | ICD-10-CM | POA: Diagnosis not present

## 2018-10-10 DIAGNOSIS — L97821 Non-pressure chronic ulcer of other part of left lower leg limited to breakdown of skin: Secondary | ICD-10-CM | POA: Diagnosis not present

## 2018-10-10 DIAGNOSIS — I83028 Varicose veins of left lower extremity with ulcer other part of lower leg: Secondary | ICD-10-CM | POA: Diagnosis not present

## 2018-10-13 ENCOUNTER — Telehealth: Payer: Self-pay | Admitting: Family Medicine

## 2018-10-13 DIAGNOSIS — L03115 Cellulitis of right lower limb: Secondary | ICD-10-CM | POA: Diagnosis not present

## 2018-10-13 DIAGNOSIS — I83028 Varicose veins of left lower extremity with ulcer other part of lower leg: Secondary | ICD-10-CM | POA: Diagnosis not present

## 2018-10-13 DIAGNOSIS — L97811 Non-pressure chronic ulcer of other part of right lower leg limited to breakdown of skin: Secondary | ICD-10-CM | POA: Diagnosis not present

## 2018-10-13 DIAGNOSIS — I83018 Varicose veins of right lower extremity with ulcer other part of lower leg: Secondary | ICD-10-CM | POA: Diagnosis not present

## 2018-10-13 DIAGNOSIS — L03116 Cellulitis of left lower limb: Secondary | ICD-10-CM | POA: Diagnosis not present

## 2018-10-13 DIAGNOSIS — E875 Hyperkalemia: Secondary | ICD-10-CM | POA: Diagnosis not present

## 2018-10-13 DIAGNOSIS — L97821 Non-pressure chronic ulcer of other part of left lower leg limited to breakdown of skin: Secondary | ICD-10-CM | POA: Diagnosis not present

## 2018-10-13 NOTE — Telephone Encounter (Unsigned)
Copied from [place] [id number]. Topic: General - Other >> Oct 13, 2018  8:42 AM Leward Quan A wrote: Reason for CRM: Herma Mering RN with [facility] called to request Vascular Surgery consult ASAP. Stated that there seem to be no blood flow to the wound. Wound is pale in color, declining, has an odor may need surgical debridement. Stated that if not treated promptly patient may end up losing her leg.

## 2018-10-14 ENCOUNTER — Other Ambulatory Visit: Payer: Self-pay | Admitting: Family Medicine

## 2018-10-14 ENCOUNTER — Telehealth: Payer: Self-pay

## 2018-10-14 ENCOUNTER — Other Ambulatory Visit: Payer: Self-pay

## 2018-10-14 ENCOUNTER — Encounter: Payer: Self-pay | Admitting: Vascular Surgery

## 2018-10-14 ENCOUNTER — Ambulatory Visit (INDEPENDENT_AMBULATORY_CARE_PROVIDER_SITE_OTHER): Payer: Medicare Other | Admitting: Vascular Surgery

## 2018-10-14 VITALS — BP 90/49 | HR 76 | Temp 97.2°F | Resp 14 | Ht 67.0 in | Wt 285.0 lb

## 2018-10-14 DIAGNOSIS — L97302 Non-pressure chronic ulcer of unspecified ankle with fat layer exposed: Secondary | ICD-10-CM

## 2018-10-14 DIAGNOSIS — I83003 Varicose veins of unspecified lower extremity with ulcer of ankle: Secondary | ICD-10-CM | POA: Diagnosis not present

## 2018-10-14 DIAGNOSIS — I739 Peripheral vascular disease, unspecified: Secondary | ICD-10-CM

## 2018-10-14 DIAGNOSIS — L97929 Non-pressure chronic ulcer of unspecified part of left lower leg with unspecified severity: Secondary | ICD-10-CM

## 2018-10-14 DIAGNOSIS — L97919 Non-pressure chronic ulcer of unspecified part of right lower leg with unspecified severity: Secondary | ICD-10-CM

## 2018-10-14 NOTE — Progress Notes (Signed)
Vascular and Vein Specialist of [facility]  Patient name: Kari [name] MRN: [id number] DOB: [birth year] Sex: female  REASON FOR CONSULT: Evaluation of lower extremity to rule out arterial insufficiency.  Known severe venous hypertension  HPI: Kari [name] is a 57 y.o. female, who is known to me from prior treatment in 2014 with severe bilateral venous stasis ulceration.  She eventually was able to heal this with compression.  She has a long-standing history of venous hypertension with prior ultrasound showing severe deep venous reflux.  She has no correctable issue regarding her venous hypertension.  Also has congestive heart failure.  She has had progression of her severe ulceration bilaterally and there was concern regarding her arterial flow.  She underwent a screening study through her home health agency and is here today for further discussion of this.  She does not have any history of claudication or arterial rest pain.  Past Medical History:  Diagnosis Date  . Asthma   . CHF (congestive heart failure) ([place]) 03/12/2014   a. EF 40-45% by echo in 06/2017 with normal cors by cath. ICD placed following VT arrest  . Depression   . Headache(784.0)   . Hyperlipidemia   . Hypertension   . Lung nodules   . Renal disorder   . Sarcoidosis   . Ulcer    recurring, from chronic venous insufficiency    Family History  Problem Relation Age of Onset  . Heart failure Mother   . Diabetes Mellitus II Mother   . Hypertension Mother   . Cancer Mother        unknown type  . Heart disease Father   . Stroke Father   . Diabetes Mellitus II Father     SOCIAL HISTORY: Social History   Socioeconomic History  . Marital status: Single    Spouse name: Not on file  . Number of children: 1  . Years of education: Not on file  . Highest education level: Not on file  Occupational History  . Not on file  Social Needs  . Financial resource strain: Not on  file  . Food insecurity:    Worry: Not on file    Inability: Not on file  . Transportation needs:    Medical: Not on file    Non-medical: Not on file  Tobacco Use  . Smoking status: Never Smoker  . Smokeless tobacco: Never Used  Substance and Sexual Activity  . Alcohol use: No  . Drug use: No  . Sexual activity: Not Currently  Lifestyle  . Physical activity:    Days per week: Not on file    Minutes per session: Not on file  . Stress: Not on file  Relationships  . Social connections:    Talks on phone: Not on file    Gets together: Not on file    Attends religious service: Not on file    Active member of club or organization: Not on file    Attends meetings of clubs or organizations: Not on file    Relationship status: Not on file  . Intimate partner violence:    Fear of current or ex partner: Not on file    Emotionally abused: Not on file    Physically abused: Not on file    Forced sexual activity: Not on file  Other Topics Concern  . Not on file  Social History Narrative   Lives alone.    Allergies  Allergen Reactions  . Tramadol  hallucination    Current Outpatient Medications  Medication Sig Dispense Refill  . albuterol (PROVENTIL HFA;VENTOLIN HFA) 108 (90 Base) MCG/ACT inhaler Inhale 2 puffs into the lungs every 6 (six) hours as needed for shortness of breath. 18 g 0  . aspirin EC 81 MG tablet Take 81 mg by mouth daily.    . carvedilol (COREG) 6.25 MG tablet Take 6.25 mg by mouth 2 (two) times daily.  2  . doxycycline (VIBRA-TABS) 100 MG tablet Take 1 tablet (100 mg total) by mouth 2 (two) times daily. 20 tablet 0  . doxycycline (VIBRAMYCIN) 100 MG capsule Take 100 mg by mouth 2 (two) times daily.  0  . febuxostat (ULORIC) 40 MG tablet Take 40 mg by mouth daily.  2  . furosemide (LASIX) 40 MG tablet Take 40 mg by mouth daily.  3  . SANTYL ointment APPLY TO WOUND AS DIRECTED PER BLE TREATMENT. 90 g 1  . SPS 15 GM/60ML suspension TAKE AS DIRECTED AS A 1 TIME  DOSE  0  . Vitamin D, Ergocalciferol, (DRISDOL) 50000 units CAPS capsule TAKE ONE CAPSULE BY MOUTH EVERY WEEK FOR EIGHT WEEKS AND THEN ONE CAPSULE EVERY MONTH THEREAFTER  0  . HYDROcodone-acetaminophen (NORCO/VICODIN) 5-325 MG tablet Take 1 tablet by mouth every 6 (six) hours as needed for moderate pain. (Patient not taking: Reported on 10/14/2018) 30 tablet 0  . loratadine (CLARITIN) 10 MG tablet Take 10 mg by mouth daily.    . ondansetron (ZOFRAN) 4 MG tablet Take 1 tablet (4 mg total) by mouth every 6 (six) hours as needed for nausea. (Patient not taking: Reported on 10/14/2018) 20 tablet 0   No current facility-administered medications for this visit.     REVIEW OF SYSTEMS:  [X]  denotes positive finding, [ ]  denotes negative finding Cardiac  Comments:  Chest pain or chest pressure:    Shortness of breath upon exertion:    Short of breath when lying flat:    Irregular heart rhythm:        Vascular    Pain in calf, thigh, or hip brought on by ambulation:    Pain in feet at night that wakes you up from your sleep:     Blood clot in your veins:    Leg swelling:  x       Pulmonary    Oxygen at home:    Productive cough:     Wheezing:         Neurologic    Sudden weakness in arms or legs:     Sudden numbness in arms or legs:     Sudden onset of difficulty speaking or slurred speech:    Temporary loss of vision in one eye:     Problems with dizziness:         Gastrointestinal    Blood in stool:     Vomited blood:         Genitourinary    Burning when urinating:     Blood in urine:        Psychiatric    Major depression:         Hematologic    Bleeding problems:    Problems with blood clotting too easily:        Skin    Rashes or ulcers: x       Constitutional    Fever or chills:      PHYSICAL EXAM: Vitals:   10/14/18 1321  BP: (!) 90/49  Pulse: 76  Resp: 14  Temp: (!) 97.2 F (36.2 C)  TempSrc: Oral  SpO2: 97%  Weight: 285 lb (129.3 kg)  Height: 5\' 7"   (1.702 m)    GENERAL: The patient is a well-nourished female, in no acute distress. The vital signs are documented above. CARDIOVASCULAR: 2+ radial pulses.  I do not palpate pedal pulses bilaterally.  She has massive swelling and marked extensive skin changes fromCEAP class VI venous stasis disease.  She does have biphasic Doppler flow at her dorsalis pedis and posterior tibial bilaterally PULMONARY: There is good air exchange  ABDOMEN: Soft and non-tender  MUSCULOSKELETAL: There are no major deformities or cyanosis. NEUROLOGIC: No focal weakness or paresthesias are detected. SKIN: Extensive, deep near circumferential venous ulcers over both lower extremities from mid calf down to her ankle. PSYCHIATRIC: The patient has a normal affect.  DATA:  Noninvasive studies from dynamic mobile imaging were reviewed from 24 2019.  This was a imaging study which suggested stenosis of the right popliteal and posterior tibial and left popliteal and peroneal arteries.  MEDICAL ISSUES: I explained that these imaging studies are not particularly reliable to determine adequacy of flow.  She does not have any history or physical findings that would suggest arterial insufficiency.  She does have good hand-held Doppler signal in her posterior tibial and dorsalis pedis bilaterally.  I would not recommend any further imaging studies for evaluation of this.  This does appear to be strictly related to [place] severe venous stasis disease which she has had for years.  She is currently undergoing home health dressing changes with Silvadene and compression.  This had been 3 times a week and is dropped back to twice a week.  Feel that she may benefit from 3 times a week redressing due to the amount of open area and some continued necrotic debris.  Also explained that she could consider being seen at the [facility] wound center if this progresses for ongoing debridement by the physicians at the wound center.  She was reassured with  this discussion will see [place] again on an as-needed basis   Rosetta Posner, MD [facility] Vascular and Vein Specialists of [facility] Tel [phone] Pager [phone]

## 2018-10-14 NOTE — Progress Notes (Signed)
Spoke with Theotis Burrow, RN [phone] She is new to patient Very worried about ulcers Left worse than right Nurse describes a very dry pale non healing wound bed, no sensation on the left [facility] did ABIs: moderate stenosis right popliteal and right posterior tibial arteries; moderate stenosis left popliteal and left popliteal arteries Urgent referral to Vasc Surg made Vascular and vein specialist of [place]

## 2018-10-14 NOTE — Telephone Encounter (Signed)
Spoke with Princess Bruins RN from [facility] regarding the wound care for pt. Doctor [name] spoke with the nurse as well. Contact information for nurse is [phone]

## 2018-10-15 DIAGNOSIS — L03116 Cellulitis of left lower limb: Secondary | ICD-10-CM | POA: Diagnosis not present

## 2018-10-15 DIAGNOSIS — L97811 Non-pressure chronic ulcer of other part of right lower leg limited to breakdown of skin: Secondary | ICD-10-CM | POA: Diagnosis not present

## 2018-10-15 DIAGNOSIS — I83028 Varicose veins of left lower extremity with ulcer other part of lower leg: Secondary | ICD-10-CM | POA: Diagnosis not present

## 2018-10-15 DIAGNOSIS — I83018 Varicose veins of right lower extremity with ulcer other part of lower leg: Secondary | ICD-10-CM | POA: Diagnosis not present

## 2018-10-15 DIAGNOSIS — L03115 Cellulitis of right lower limb: Secondary | ICD-10-CM | POA: Diagnosis not present

## 2018-10-15 DIAGNOSIS — L97821 Non-pressure chronic ulcer of other part of left lower leg limited to breakdown of skin: Secondary | ICD-10-CM | POA: Diagnosis not present

## 2018-10-17 DIAGNOSIS — L97821 Non-pressure chronic ulcer of other part of left lower leg limited to breakdown of skin: Secondary | ICD-10-CM | POA: Diagnosis not present

## 2018-10-17 DIAGNOSIS — L97811 Non-pressure chronic ulcer of other part of right lower leg limited to breakdown of skin: Secondary | ICD-10-CM | POA: Diagnosis not present

## 2018-10-17 DIAGNOSIS — L03116 Cellulitis of left lower limb: Secondary | ICD-10-CM | POA: Diagnosis not present

## 2018-10-17 DIAGNOSIS — L03115 Cellulitis of right lower limb: Secondary | ICD-10-CM | POA: Diagnosis not present

## 2018-10-17 DIAGNOSIS — I83028 Varicose veins of left lower extremity with ulcer other part of lower leg: Secondary | ICD-10-CM | POA: Diagnosis not present

## 2018-10-17 DIAGNOSIS — I83018 Varicose veins of right lower extremity with ulcer other part of lower leg: Secondary | ICD-10-CM | POA: Diagnosis not present

## 2018-10-20 DIAGNOSIS — L03115 Cellulitis of right lower limb: Secondary | ICD-10-CM | POA: Diagnosis not present

## 2018-10-20 DIAGNOSIS — L97821 Non-pressure chronic ulcer of other part of left lower leg limited to breakdown of skin: Secondary | ICD-10-CM | POA: Diagnosis not present

## 2018-10-20 DIAGNOSIS — I83028 Varicose veins of left lower extremity with ulcer other part of lower leg: Secondary | ICD-10-CM | POA: Diagnosis not present

## 2018-10-20 DIAGNOSIS — L03116 Cellulitis of left lower limb: Secondary | ICD-10-CM | POA: Diagnosis not present

## 2018-10-20 DIAGNOSIS — L97811 Non-pressure chronic ulcer of other part of right lower leg limited to breakdown of skin: Secondary | ICD-10-CM | POA: Diagnosis not present

## 2018-10-20 DIAGNOSIS — I83018 Varicose veins of right lower extremity with ulcer other part of lower leg: Secondary | ICD-10-CM | POA: Diagnosis not present

## 2018-10-22 DIAGNOSIS — I83028 Varicose veins of left lower extremity with ulcer other part of lower leg: Secondary | ICD-10-CM | POA: Diagnosis not present

## 2018-10-22 DIAGNOSIS — L03115 Cellulitis of right lower limb: Secondary | ICD-10-CM | POA: Diagnosis not present

## 2018-10-22 DIAGNOSIS — L03116 Cellulitis of left lower limb: Secondary | ICD-10-CM | POA: Diagnosis not present

## 2018-10-22 DIAGNOSIS — L97821 Non-pressure chronic ulcer of other part of left lower leg limited to breakdown of skin: Secondary | ICD-10-CM | POA: Diagnosis not present

## 2018-10-22 DIAGNOSIS — I83018 Varicose veins of right lower extremity with ulcer other part of lower leg: Secondary | ICD-10-CM | POA: Diagnosis not present

## 2018-10-22 DIAGNOSIS — L97811 Non-pressure chronic ulcer of other part of right lower leg limited to breakdown of skin: Secondary | ICD-10-CM | POA: Diagnosis not present

## 2018-10-23 DIAGNOSIS — I129 Hypertensive chronic kidney disease with stage 1 through stage 4 chronic kidney disease, or unspecified chronic kidney disease: Secondary | ICD-10-CM | POA: Diagnosis not present

## 2018-10-23 DIAGNOSIS — E875 Hyperkalemia: Secondary | ICD-10-CM | POA: Diagnosis not present

## 2018-10-23 DIAGNOSIS — E559 Vitamin D deficiency, unspecified: Secondary | ICD-10-CM | POA: Diagnosis not present

## 2018-10-23 DIAGNOSIS — D631 Anemia in chronic kidney disease: Secondary | ICD-10-CM | POA: Diagnosis not present

## 2018-10-23 DIAGNOSIS — N183 Chronic kidney disease, stage 3 (moderate): Secondary | ICD-10-CM | POA: Diagnosis not present

## 2018-10-24 DIAGNOSIS — I83018 Varicose veins of right lower extremity with ulcer other part of lower leg: Secondary | ICD-10-CM | POA: Diagnosis not present

## 2018-10-24 DIAGNOSIS — L03115 Cellulitis of right lower limb: Secondary | ICD-10-CM | POA: Diagnosis not present

## 2018-10-24 DIAGNOSIS — I83028 Varicose veins of left lower extremity with ulcer other part of lower leg: Secondary | ICD-10-CM | POA: Diagnosis not present

## 2018-10-24 DIAGNOSIS — L03116 Cellulitis of left lower limb: Secondary | ICD-10-CM | POA: Diagnosis not present

## 2018-10-24 DIAGNOSIS — L97821 Non-pressure chronic ulcer of other part of left lower leg limited to breakdown of skin: Secondary | ICD-10-CM | POA: Diagnosis not present

## 2018-10-24 DIAGNOSIS — L97811 Non-pressure chronic ulcer of other part of right lower leg limited to breakdown of skin: Secondary | ICD-10-CM | POA: Diagnosis not present

## 2018-10-27 DIAGNOSIS — I83028 Varicose veins of left lower extremity with ulcer other part of lower leg: Secondary | ICD-10-CM | POA: Diagnosis not present

## 2018-10-27 DIAGNOSIS — I4901 Ventricular fibrillation: Secondary | ICD-10-CM | POA: Diagnosis not present

## 2018-10-27 DIAGNOSIS — N184 Chronic kidney disease, stage 4 (severe): Secondary | ICD-10-CM | POA: Diagnosis not present

## 2018-10-27 DIAGNOSIS — I051 Rheumatic mitral insufficiency: Secondary | ICD-10-CM | POA: Diagnosis not present

## 2018-10-27 DIAGNOSIS — I70209 Unspecified atherosclerosis of native arteries of extremities, unspecified extremity: Secondary | ICD-10-CM | POA: Diagnosis not present

## 2018-10-27 DIAGNOSIS — I255 Ischemic cardiomyopathy: Secondary | ICD-10-CM | POA: Diagnosis not present

## 2018-10-27 DIAGNOSIS — I83018 Varicose veins of right lower extremity with ulcer other part of lower leg: Secondary | ICD-10-CM | POA: Diagnosis not present

## 2018-10-27 DIAGNOSIS — Z9181 History of falling: Secondary | ICD-10-CM | POA: Diagnosis not present

## 2018-10-27 DIAGNOSIS — L03116 Cellulitis of left lower limb: Secondary | ICD-10-CM | POA: Diagnosis not present

## 2018-10-27 DIAGNOSIS — I130 Hypertensive heart and chronic kidney disease with heart failure and stage 1 through stage 4 chronic kidney disease, or unspecified chronic kidney disease: Secondary | ICD-10-CM | POA: Diagnosis not present

## 2018-10-27 DIAGNOSIS — L97811 Non-pressure chronic ulcer of other part of right lower leg limited to breakdown of skin: Secondary | ICD-10-CM | POA: Diagnosis not present

## 2018-10-27 DIAGNOSIS — Z6841 Body Mass Index (BMI) 40.0 and over, adult: Secondary | ICD-10-CM | POA: Diagnosis not present

## 2018-10-27 DIAGNOSIS — J45909 Unspecified asthma, uncomplicated: Secondary | ICD-10-CM | POA: Diagnosis not present

## 2018-10-27 DIAGNOSIS — L03115 Cellulitis of right lower limb: Secondary | ICD-10-CM | POA: Diagnosis not present

## 2018-10-27 DIAGNOSIS — Z7982 Long term (current) use of aspirin: Secondary | ICD-10-CM | POA: Diagnosis not present

## 2018-10-27 DIAGNOSIS — F329 Major depressive disorder, single episode, unspecified: Secondary | ICD-10-CM | POA: Diagnosis not present

## 2018-10-27 DIAGNOSIS — D631 Anemia in chronic kidney disease: Secondary | ICD-10-CM | POA: Diagnosis not present

## 2018-10-27 DIAGNOSIS — I5040 Unspecified combined systolic (congestive) and diastolic (congestive) heart failure: Secondary | ICD-10-CM | POA: Diagnosis not present

## 2018-10-27 DIAGNOSIS — L97821 Non-pressure chronic ulcer of other part of left lower leg limited to breakdown of skin: Secondary | ICD-10-CM | POA: Diagnosis not present

## 2018-10-28 ENCOUNTER — Telehealth: Payer: Self-pay | Admitting: Family Medicine

## 2018-10-28 NOTE — Telephone Encounter (Signed)
Copied from [place] [id number]. Topic: Quick Communication - Home Health Verbal Orders >> Oct 28, 2018  1:40 PM Lennox Solders wrote: Caller/Agency: dana rn [facility] Callback Number: [phone] verbal order for hydrofera instead of aqua cell dressing

## 2018-10-28 NOTE — Telephone Encounter (Signed)
pls see note. Thanks

## 2018-10-28 NOTE — Telephone Encounter (Signed)
Please call Home heath care agency back and give verbal order approving change of dressing type. thanks

## 2018-10-29 DIAGNOSIS — L97811 Non-pressure chronic ulcer of other part of right lower leg limited to breakdown of skin: Secondary | ICD-10-CM | POA: Diagnosis not present

## 2018-10-29 DIAGNOSIS — L03115 Cellulitis of right lower limb: Secondary | ICD-10-CM | POA: Diagnosis not present

## 2018-10-29 DIAGNOSIS — I83028 Varicose veins of left lower extremity with ulcer other part of lower leg: Secondary | ICD-10-CM | POA: Diagnosis not present

## 2018-10-29 DIAGNOSIS — I83018 Varicose veins of right lower extremity with ulcer other part of lower leg: Secondary | ICD-10-CM | POA: Diagnosis not present

## 2018-10-29 DIAGNOSIS — L97821 Non-pressure chronic ulcer of other part of left lower leg limited to breakdown of skin: Secondary | ICD-10-CM | POA: Diagnosis not present

## 2018-10-29 DIAGNOSIS — L03116 Cellulitis of left lower limb: Secondary | ICD-10-CM | POA: Diagnosis not present

## 2018-10-29 NOTE — Telephone Encounter (Signed)
Verbal given 

## 2018-11-04 DIAGNOSIS — I83028 Varicose veins of left lower extremity with ulcer other part of lower leg: Secondary | ICD-10-CM | POA: Diagnosis not present

## 2018-11-04 DIAGNOSIS — L97811 Non-pressure chronic ulcer of other part of right lower leg limited to breakdown of skin: Secondary | ICD-10-CM | POA: Diagnosis not present

## 2018-11-04 DIAGNOSIS — L03115 Cellulitis of right lower limb: Secondary | ICD-10-CM | POA: Diagnosis not present

## 2018-11-04 DIAGNOSIS — I83018 Varicose veins of right lower extremity with ulcer other part of lower leg: Secondary | ICD-10-CM | POA: Diagnosis not present

## 2018-11-04 DIAGNOSIS — L03116 Cellulitis of left lower limb: Secondary | ICD-10-CM | POA: Diagnosis not present

## 2018-11-04 DIAGNOSIS — L97821 Non-pressure chronic ulcer of other part of left lower leg limited to breakdown of skin: Secondary | ICD-10-CM | POA: Diagnosis not present

## 2018-11-06 DIAGNOSIS — L97811 Non-pressure chronic ulcer of other part of right lower leg limited to breakdown of skin: Secondary | ICD-10-CM | POA: Diagnosis not present

## 2018-11-06 DIAGNOSIS — L97821 Non-pressure chronic ulcer of other part of left lower leg limited to breakdown of skin: Secondary | ICD-10-CM | POA: Diagnosis not present

## 2018-11-06 DIAGNOSIS — L03116 Cellulitis of left lower limb: Secondary | ICD-10-CM | POA: Diagnosis not present

## 2018-11-06 DIAGNOSIS — I83018 Varicose veins of right lower extremity with ulcer other part of lower leg: Secondary | ICD-10-CM | POA: Diagnosis not present

## 2018-11-06 DIAGNOSIS — L03115 Cellulitis of right lower limb: Secondary | ICD-10-CM | POA: Diagnosis not present

## 2018-11-06 DIAGNOSIS — I83028 Varicose veins of left lower extremity with ulcer other part of lower leg: Secondary | ICD-10-CM | POA: Diagnosis not present

## 2018-11-10 DIAGNOSIS — L97811 Non-pressure chronic ulcer of other part of right lower leg limited to breakdown of skin: Secondary | ICD-10-CM | POA: Diagnosis not present

## 2018-11-10 DIAGNOSIS — L03116 Cellulitis of left lower limb: Secondary | ICD-10-CM | POA: Diagnosis not present

## 2018-11-10 DIAGNOSIS — I83028 Varicose veins of left lower extremity with ulcer other part of lower leg: Secondary | ICD-10-CM | POA: Diagnosis not present

## 2018-11-10 DIAGNOSIS — L97821 Non-pressure chronic ulcer of other part of left lower leg limited to breakdown of skin: Secondary | ICD-10-CM | POA: Diagnosis not present

## 2018-11-10 DIAGNOSIS — I83018 Varicose veins of right lower extremity with ulcer other part of lower leg: Secondary | ICD-10-CM | POA: Diagnosis not present

## 2018-11-10 DIAGNOSIS — L03115 Cellulitis of right lower limb: Secondary | ICD-10-CM | POA: Diagnosis not present

## 2018-11-12 DIAGNOSIS — I83018 Varicose veins of right lower extremity with ulcer other part of lower leg: Secondary | ICD-10-CM | POA: Diagnosis not present

## 2018-11-12 DIAGNOSIS — L03116 Cellulitis of left lower limb: Secondary | ICD-10-CM | POA: Diagnosis not present

## 2018-11-12 DIAGNOSIS — I83028 Varicose veins of left lower extremity with ulcer other part of lower leg: Secondary | ICD-10-CM | POA: Diagnosis not present

## 2018-11-12 DIAGNOSIS — L03115 Cellulitis of right lower limb: Secondary | ICD-10-CM | POA: Diagnosis not present

## 2018-11-12 DIAGNOSIS — L97821 Non-pressure chronic ulcer of other part of left lower leg limited to breakdown of skin: Secondary | ICD-10-CM | POA: Diagnosis not present

## 2018-11-12 DIAGNOSIS — L97811 Non-pressure chronic ulcer of other part of right lower leg limited to breakdown of skin: Secondary | ICD-10-CM | POA: Diagnosis not present

## 2018-11-14 DIAGNOSIS — L03115 Cellulitis of right lower limb: Secondary | ICD-10-CM | POA: Diagnosis not present

## 2018-11-14 DIAGNOSIS — I83028 Varicose veins of left lower extremity with ulcer other part of lower leg: Secondary | ICD-10-CM | POA: Diagnosis not present

## 2018-11-14 DIAGNOSIS — L97811 Non-pressure chronic ulcer of other part of right lower leg limited to breakdown of skin: Secondary | ICD-10-CM | POA: Diagnosis not present

## 2018-11-14 DIAGNOSIS — L97821 Non-pressure chronic ulcer of other part of left lower leg limited to breakdown of skin: Secondary | ICD-10-CM | POA: Diagnosis not present

## 2018-11-14 DIAGNOSIS — L03116 Cellulitis of left lower limb: Secondary | ICD-10-CM | POA: Diagnosis not present

## 2018-11-14 DIAGNOSIS — I83018 Varicose veins of right lower extremity with ulcer other part of lower leg: Secondary | ICD-10-CM | POA: Diagnosis not present

## 2018-11-17 ENCOUNTER — Telehealth: Payer: Self-pay | Admitting: Family Medicine

## 2018-11-17 DIAGNOSIS — L97811 Non-pressure chronic ulcer of other part of right lower leg limited to breakdown of skin: Secondary | ICD-10-CM | POA: Diagnosis not present

## 2018-11-17 DIAGNOSIS — L03116 Cellulitis of left lower limb: Secondary | ICD-10-CM | POA: Diagnosis not present

## 2018-11-17 DIAGNOSIS — L97821 Non-pressure chronic ulcer of other part of left lower leg limited to breakdown of skin: Secondary | ICD-10-CM | POA: Diagnosis not present

## 2018-11-17 DIAGNOSIS — I83028 Varicose veins of left lower extremity with ulcer other part of lower leg: Secondary | ICD-10-CM | POA: Diagnosis not present

## 2018-11-17 DIAGNOSIS — I83018 Varicose veins of right lower extremity with ulcer other part of lower leg: Secondary | ICD-10-CM | POA: Diagnosis not present

## 2018-11-17 DIAGNOSIS — L03115 Cellulitis of right lower limb: Secondary | ICD-10-CM | POA: Diagnosis not present

## 2018-11-17 NOTE — Telephone Encounter (Signed)
Patient is requesting a new medication:  Tramadol    (Patient states present medication is not helping her wound pain)  LOV: 10/14/18  PCP: [place]: verified

## 2018-11-17 NOTE — Telephone Encounter (Signed)
Copied from [place] [id number]. Topic: Quick Communication - Rx Refill/Question >> Nov 17, 2018 10:03 AM Bea Graff, NT wrote: Medication: Tramadol  Has the patient contacted their pharmacy? No (Agent: If no, request that the patient contact the pharmacy for the refill.) (Agent: If yes, when and what did the pharmacy advise?) Pt states she would like Tramadol sent in as the Hydrocodone does not help her wound pain.    Preferred Pharmacy (with phone number or street name): [place], [place] - [address] [phone] (Phone) [phone] (Fax)    Agent: Please be advised that RX refills may take up to 3 business days. We ask that you follow-up with your pharmacy.

## 2018-11-18 ENCOUNTER — Encounter: Payer: Self-pay | Admitting: Cardiology

## 2018-11-19 DIAGNOSIS — I83028 Varicose veins of left lower extremity with ulcer other part of lower leg: Secondary | ICD-10-CM | POA: Diagnosis not present

## 2018-11-19 DIAGNOSIS — L03115 Cellulitis of right lower limb: Secondary | ICD-10-CM | POA: Diagnosis not present

## 2018-11-19 DIAGNOSIS — L03116 Cellulitis of left lower limb: Secondary | ICD-10-CM | POA: Diagnosis not present

## 2018-11-19 DIAGNOSIS — L97821 Non-pressure chronic ulcer of other part of left lower leg limited to breakdown of skin: Secondary | ICD-10-CM | POA: Diagnosis not present

## 2018-11-19 DIAGNOSIS — I83018 Varicose veins of right lower extremity with ulcer other part of lower leg: Secondary | ICD-10-CM | POA: Diagnosis not present

## 2018-11-19 DIAGNOSIS — L97811 Non-pressure chronic ulcer of other part of right lower leg limited to breakdown of skin: Secondary | ICD-10-CM | POA: Diagnosis not present

## 2018-11-24 DIAGNOSIS — L97821 Non-pressure chronic ulcer of other part of left lower leg limited to breakdown of skin: Secondary | ICD-10-CM | POA: Diagnosis not present

## 2018-11-24 DIAGNOSIS — I83018 Varicose veins of right lower extremity with ulcer other part of lower leg: Secondary | ICD-10-CM | POA: Diagnosis not present

## 2018-11-24 DIAGNOSIS — L03116 Cellulitis of left lower limb: Secondary | ICD-10-CM | POA: Diagnosis not present

## 2018-11-24 DIAGNOSIS — L03115 Cellulitis of right lower limb: Secondary | ICD-10-CM | POA: Diagnosis not present

## 2018-11-24 DIAGNOSIS — L97811 Non-pressure chronic ulcer of other part of right lower leg limited to breakdown of skin: Secondary | ICD-10-CM | POA: Diagnosis not present

## 2018-11-24 DIAGNOSIS — I83028 Varicose veins of left lower extremity with ulcer other part of lower leg: Secondary | ICD-10-CM | POA: Diagnosis not present

## 2018-11-24 NOTE — Telephone Encounter (Signed)
Please clarify with patient as tramadol is stated as causing hallucinations

## 2018-11-24 NOTE — Addendum Note (Signed)
Addended by: Rutherford Guys on: 11/24/2018 12:41 PM   Modules accepted: Orders

## 2018-11-25 NOTE — Telephone Encounter (Signed)
Spoke with patient, she stated that the Hydrocodone "help to ease the pain". She prefers the Hydrocodone, instead of the Tramadol. She also, stated that her nurse thinks that she needs an antibiotic due to clear drainage from her wound, it has a odor, "not bad", but can smell it a little x 3 days. [place] [phone].

## 2018-11-26 DIAGNOSIS — I83028 Varicose veins of left lower extremity with ulcer other part of lower leg: Secondary | ICD-10-CM | POA: Diagnosis not present

## 2018-11-26 DIAGNOSIS — I83018 Varicose veins of right lower extremity with ulcer other part of lower leg: Secondary | ICD-10-CM | POA: Diagnosis not present

## 2018-11-26 DIAGNOSIS — L03116 Cellulitis of left lower limb: Secondary | ICD-10-CM | POA: Diagnosis not present

## 2018-11-26 DIAGNOSIS — L03115 Cellulitis of right lower limb: Secondary | ICD-10-CM | POA: Diagnosis not present

## 2018-11-26 DIAGNOSIS — L97821 Non-pressure chronic ulcer of other part of left lower leg limited to breakdown of skin: Secondary | ICD-10-CM | POA: Diagnosis not present

## 2018-11-26 DIAGNOSIS — L97811 Non-pressure chronic ulcer of other part of right lower leg limited to breakdown of skin: Secondary | ICD-10-CM | POA: Diagnosis not present

## 2018-11-26 MED ORDER — HYDROCODONE-ACETAMINOPHEN 5-325 MG PO TABS: 1.0000 | ORAL_TABLET | Freq: Four times a day (QID) | ORAL | 0 refills | Status: DC | PRN

## 2018-11-26 NOTE — Addendum Note (Signed)
Addended by: Rutherford Guys on: 11/26/2018 08:40 AM   Modules accepted: Orders

## 2018-11-27 ENCOUNTER — Other Ambulatory Visit: Payer: Self-pay | Admitting: Family Medicine

## 2018-11-27 NOTE — Telephone Encounter (Signed)
Requested Prescriptions  Refused Prescriptions Disp Refills  . doxycycline (VIBRA-TABS) 100 MG tablet 20 tablet 0    Sig: Take 1 tablet (100 mg total) by mouth 2 (two) times daily.     Off-Protocol Failed - 11/27/2018 10:05 AM      Failed - Medication not assigned to a protocol, review manually.      Passed - Valid encounter within last 12 months    Recent Outpatient Visits          2 months ago Venous stasis ulcer of calf, unspecified laterality, unspecified ulcer stage, unspecified whether varicose veins present [facility])   [facility], Lilia Argue, MD   6 months ago Venous stasis ulcer of calf, unspecified laterality, unspecified ulcer stage, unspecified whether varicose veins present [facility])   [facility], Lilia Argue, MD   8 months ago Venous (peripheral) insufficiency   [facility], Lilia Argue, MD   8 months ago Encounter for support and coordination of transition of care   [facility], Lilia Argue, MD      Future Appointments            In 2 weeks Rutherford Guys, MD Primary Care at [place]         As per 09/11/18 LOV note by Dr. [name], patient needs outpatient wound care to manage wounds and medications for wounds. TC to patient. Reminded patient of the physician's recommendation to receive wound care and medication management of her wounds on an outpatient basis. Offered to assist with referral, patient declined stating she would set it up herself. There is an appointment on record for 12/12/18 with PCP. Antibiotic request refused at this time. Patient aware.

## 2018-11-27 NOTE — Telephone Encounter (Signed)
Copied from [place] [id number]. Topic: Quick Communication - Rx Refill/Question >> Nov 27, 2018  9:01 AM Alanda Slim E wrote: Medication: doxycycline (VIBRA-TABS) 100 MG tablet ( Pt called to check the status of this prior request with Dr. [name])  Has the patient contacted their pharmacy? NO   Preferred Pharmacy (with phone number or street name): [place], [place] - [address] [phone] (Phone) [phone] (Fax)

## 2018-11-28 ENCOUNTER — Telehealth: Payer: Self-pay | Admitting: Family Medicine

## 2018-11-28 DIAGNOSIS — L97811 Non-pressure chronic ulcer of other part of right lower leg limited to breakdown of skin: Secondary | ICD-10-CM | POA: Diagnosis not present

## 2018-11-28 DIAGNOSIS — L03115 Cellulitis of right lower limb: Secondary | ICD-10-CM | POA: Diagnosis not present

## 2018-11-28 DIAGNOSIS — L97821 Non-pressure chronic ulcer of other part of left lower leg limited to breakdown of skin: Secondary | ICD-10-CM | POA: Diagnosis not present

## 2018-11-28 DIAGNOSIS — I83018 Varicose veins of right lower extremity with ulcer other part of lower leg: Secondary | ICD-10-CM | POA: Diagnosis not present

## 2018-11-28 DIAGNOSIS — I83028 Varicose veins of left lower extremity with ulcer other part of lower leg: Secondary | ICD-10-CM | POA: Diagnosis not present

## 2018-11-28 DIAGNOSIS — L03116 Cellulitis of left lower limb: Secondary | ICD-10-CM | POA: Diagnosis not present

## 2018-11-28 NOTE — Telephone Encounter (Signed)
Copied from [place] [id number]. Topic: Quick Communication - Home Health Verbal Orders >> Nov 28, 2018 10:51 AM Windy Kalata wrote: Caller/Agency: Devyn from [facility] home health called in regards to orders being faxed back to her that were signed. Callback Number: [phone] at ext [phone] [id number] fax back orders that are signed for patient  Fax is [phone]

## 2018-12-04 DIAGNOSIS — I83028 Varicose veins of left lower extremity with ulcer other part of lower leg: Secondary | ICD-10-CM | POA: Diagnosis not present

## 2018-12-04 DIAGNOSIS — I83018 Varicose veins of right lower extremity with ulcer other part of lower leg: Secondary | ICD-10-CM | POA: Diagnosis not present

## 2018-12-04 DIAGNOSIS — L97821 Non-pressure chronic ulcer of other part of left lower leg limited to breakdown of skin: Secondary | ICD-10-CM | POA: Diagnosis not present

## 2018-12-04 DIAGNOSIS — L03116 Cellulitis of left lower limb: Secondary | ICD-10-CM | POA: Diagnosis not present

## 2018-12-04 DIAGNOSIS — L97811 Non-pressure chronic ulcer of other part of right lower leg limited to breakdown of skin: Secondary | ICD-10-CM | POA: Diagnosis not present

## 2018-12-04 DIAGNOSIS — L03115 Cellulitis of right lower limb: Secondary | ICD-10-CM | POA: Diagnosis not present

## 2018-12-09 ENCOUNTER — Ambulatory Visit (INDEPENDENT_AMBULATORY_CARE_PROVIDER_SITE_OTHER): Payer: Medicare Other

## 2018-12-09 DIAGNOSIS — I428 Other cardiomyopathies: Secondary | ICD-10-CM | POA: Diagnosis not present

## 2018-12-09 DIAGNOSIS — L03116 Cellulitis of left lower limb: Secondary | ICD-10-CM | POA: Diagnosis not present

## 2018-12-09 DIAGNOSIS — I83018 Varicose veins of right lower extremity with ulcer other part of lower leg: Secondary | ICD-10-CM | POA: Diagnosis not present

## 2018-12-09 DIAGNOSIS — I83028 Varicose veins of left lower extremity with ulcer other part of lower leg: Secondary | ICD-10-CM | POA: Diagnosis not present

## 2018-12-09 DIAGNOSIS — L97811 Non-pressure chronic ulcer of other part of right lower leg limited to breakdown of skin: Secondary | ICD-10-CM | POA: Diagnosis not present

## 2018-12-09 DIAGNOSIS — L03115 Cellulitis of right lower limb: Secondary | ICD-10-CM | POA: Diagnosis not present

## 2018-12-09 DIAGNOSIS — L97821 Non-pressure chronic ulcer of other part of left lower leg limited to breakdown of skin: Secondary | ICD-10-CM | POA: Diagnosis not present

## 2018-12-09 NOTE — Telephone Encounter (Signed)
Patient's concern/request has been addressed

## 2018-12-10 ENCOUNTER — Encounter: Payer: Self-pay | Admitting: Cardiology

## 2018-12-10 NOTE — Progress Notes (Signed)
Remote ICD transmission.   

## 2018-12-11 DIAGNOSIS — I83018 Varicose veins of right lower extremity with ulcer other part of lower leg: Secondary | ICD-10-CM | POA: Diagnosis not present

## 2018-12-11 DIAGNOSIS — L03116 Cellulitis of left lower limb: Secondary | ICD-10-CM | POA: Diagnosis not present

## 2018-12-11 DIAGNOSIS — L97821 Non-pressure chronic ulcer of other part of left lower leg limited to breakdown of skin: Secondary | ICD-10-CM | POA: Diagnosis not present

## 2018-12-11 DIAGNOSIS — I83028 Varicose veins of left lower extremity with ulcer other part of lower leg: Secondary | ICD-10-CM | POA: Diagnosis not present

## 2018-12-11 DIAGNOSIS — L97811 Non-pressure chronic ulcer of other part of right lower leg limited to breakdown of skin: Secondary | ICD-10-CM | POA: Diagnosis not present

## 2018-12-11 DIAGNOSIS — L03115 Cellulitis of right lower limb: Secondary | ICD-10-CM | POA: Diagnosis not present

## 2018-12-12 ENCOUNTER — Ambulatory Visit: Payer: Medicare Other | Admitting: Family Medicine

## 2018-12-16 DIAGNOSIS — L03115 Cellulitis of right lower limb: Secondary | ICD-10-CM | POA: Diagnosis not present

## 2018-12-16 DIAGNOSIS — I83028 Varicose veins of left lower extremity with ulcer other part of lower leg: Secondary | ICD-10-CM | POA: Diagnosis not present

## 2018-12-16 DIAGNOSIS — I83018 Varicose veins of right lower extremity with ulcer other part of lower leg: Secondary | ICD-10-CM | POA: Diagnosis not present

## 2018-12-16 DIAGNOSIS — L97811 Non-pressure chronic ulcer of other part of right lower leg limited to breakdown of skin: Secondary | ICD-10-CM | POA: Diagnosis not present

## 2018-12-16 DIAGNOSIS — L03116 Cellulitis of left lower limb: Secondary | ICD-10-CM | POA: Diagnosis not present

## 2018-12-16 DIAGNOSIS — L97821 Non-pressure chronic ulcer of other part of left lower leg limited to breakdown of skin: Secondary | ICD-10-CM | POA: Diagnosis not present

## 2018-12-19 ENCOUNTER — Telehealth: Payer: Self-pay

## 2018-12-19 NOTE — Telephone Encounter (Signed)
Kari [name] with [facility] called to check on status of orders that was faxed over on 11/27/18. She stated that it is imperative that this paperwork is signed and returned to them as soon as possible. Please address

## 2018-12-19 NOTE — Telephone Encounter (Signed)
[facility] is c/b to check on status of paperwork.  Thank you!

## 2018-12-19 NOTE — Telephone Encounter (Signed)
Called left message for Kari [name] for the paperwork to be refaxed from [facility].

## 2018-12-23 DIAGNOSIS — L97811 Non-pressure chronic ulcer of other part of right lower leg limited to breakdown of skin: Secondary | ICD-10-CM | POA: Diagnosis not present

## 2018-12-23 DIAGNOSIS — I83018 Varicose veins of right lower extremity with ulcer other part of lower leg: Secondary | ICD-10-CM | POA: Diagnosis not present

## 2018-12-23 DIAGNOSIS — L03116 Cellulitis of left lower limb: Secondary | ICD-10-CM | POA: Diagnosis not present

## 2018-12-23 DIAGNOSIS — L97821 Non-pressure chronic ulcer of other part of left lower leg limited to breakdown of skin: Secondary | ICD-10-CM | POA: Diagnosis not present

## 2018-12-23 DIAGNOSIS — I83028 Varicose veins of left lower extremity with ulcer other part of lower leg: Secondary | ICD-10-CM | POA: Diagnosis not present

## 2018-12-23 DIAGNOSIS — L03115 Cellulitis of right lower limb: Secondary | ICD-10-CM | POA: Diagnosis not present

## 2018-12-25 ENCOUNTER — Inpatient Hospital Stay (HOSPITAL_COMMUNITY)
Admission: EM | Admit: 2018-12-25 | Discharge: 2019-01-09 | DRG: 573 | Disposition: A | Discharge status: Home Health | Payer: Medicare Other | Attending: Internal Medicine | Admitting: Internal Medicine

## 2018-12-25 ENCOUNTER — Other Ambulatory Visit: Payer: Self-pay

## 2018-12-25 ENCOUNTER — Encounter (HOSPITAL_COMMUNITY): Payer: Self-pay | Admitting: Emergency Medicine

## 2018-12-25 DIAGNOSIS — E785 Hyperlipidemia, unspecified: Secondary | ICD-10-CM | POA: Diagnosis present

## 2018-12-25 DIAGNOSIS — L97929 Non-pressure chronic ulcer of unspecified part of left lower leg with unspecified severity: Secondary | ICD-10-CM | POA: Diagnosis not present

## 2018-12-25 DIAGNOSIS — L03115 Cellulitis of right lower limb: Secondary | ICD-10-CM | POA: Diagnosis present

## 2018-12-25 DIAGNOSIS — I87309 Chronic venous hypertension (idiopathic) without complications of unspecified lower extremity: Secondary | ICD-10-CM | POA: Diagnosis present

## 2018-12-25 DIAGNOSIS — L089 Local infection of the skin and subcutaneous tissue, unspecified: Secondary | ICD-10-CM | POA: Diagnosis not present

## 2018-12-25 DIAGNOSIS — N183 Chronic kidney disease, stage 3 (moderate): Secondary | ICD-10-CM | POA: Diagnosis present

## 2018-12-25 DIAGNOSIS — I83028 Varicose veins of left lower extremity with ulcer other part of lower leg: Secondary | ICD-10-CM | POA: Diagnosis not present

## 2018-12-25 DIAGNOSIS — E43 Unspecified severe protein-calorie malnutrition: Secondary | ICD-10-CM | POA: Diagnosis not present

## 2018-12-25 DIAGNOSIS — Z7982 Long term (current) use of aspirin: Secondary | ICD-10-CM

## 2018-12-25 DIAGNOSIS — Z95810 Presence of automatic (implantable) cardiac defibrillator: Secondary | ICD-10-CM

## 2018-12-25 DIAGNOSIS — L03116 Cellulitis of left lower limb: Secondary | ICD-10-CM | POA: Diagnosis not present

## 2018-12-25 DIAGNOSIS — I872 Venous insufficiency (chronic) (peripheral): Secondary | ICD-10-CM | POA: Diagnosis not present

## 2018-12-25 DIAGNOSIS — L97909 Non-pressure chronic ulcer of unspecified part of unspecified lower leg with unspecified severity: Secondary | ICD-10-CM

## 2018-12-25 DIAGNOSIS — I509 Heart failure, unspecified: Secondary | ICD-10-CM

## 2018-12-25 DIAGNOSIS — T8140XA Infection following a procedure, unspecified, initial encounter: Secondary | ICD-10-CM | POA: Diagnosis not present

## 2018-12-25 DIAGNOSIS — T148XXA Other injury of unspecified body region, initial encounter: Secondary | ICD-10-CM

## 2018-12-25 DIAGNOSIS — I83029 Varicose veins of left lower extremity with ulcer of unspecified site: Secondary | ICD-10-CM

## 2018-12-25 DIAGNOSIS — D631 Anemia in chronic kidney disease: Secondary | ICD-10-CM | POA: Diagnosis present

## 2018-12-25 DIAGNOSIS — I87333 Chronic venous hypertension (idiopathic) with ulcer and inflammation of bilateral lower extremity: Secondary | ICD-10-CM

## 2018-12-25 DIAGNOSIS — Z6839 Body mass index (BMI) 39.0-39.9, adult: Secondary | ICD-10-CM

## 2018-12-25 DIAGNOSIS — L97821 Non-pressure chronic ulcer of other part of left lower leg limited to breakdown of skin: Secondary | ICD-10-CM | POA: Diagnosis not present

## 2018-12-25 DIAGNOSIS — I83019 Varicose veins of right lower extremity with ulcer of unspecified site: Secondary | ICD-10-CM

## 2018-12-25 DIAGNOSIS — I878 Other specified disorders of veins: Secondary | ICD-10-CM | POA: Diagnosis present

## 2018-12-25 DIAGNOSIS — I5042 Chronic combined systolic (congestive) and diastolic (congestive) heart failure: Secondary | ICD-10-CM | POA: Diagnosis present

## 2018-12-25 DIAGNOSIS — L97919 Non-pressure chronic ulcer of unspecified part of right lower leg with unspecified severity: Secondary | ICD-10-CM | POA: Diagnosis present

## 2018-12-25 DIAGNOSIS — D869 Sarcoidosis, unspecified: Secondary | ICD-10-CM | POA: Diagnosis present

## 2018-12-25 DIAGNOSIS — Z79899 Other long term (current) drug therapy: Secondary | ICD-10-CM

## 2018-12-25 DIAGNOSIS — I83009 Varicose veins of unspecified lower extremity with ulcer of unspecified site: Secondary | ICD-10-CM | POA: Diagnosis present

## 2018-12-25 DIAGNOSIS — D649 Anemia, unspecified: Secondary | ICD-10-CM | POA: Diagnosis present

## 2018-12-25 DIAGNOSIS — I10 Essential (primary) hypertension: Secondary | ICD-10-CM | POA: Diagnosis present

## 2018-12-25 DIAGNOSIS — I130 Hypertensive heart and chronic kidney disease with heart failure and stage 1 through stage 4 chronic kidney disease, or unspecified chronic kidney disease: Secondary | ICD-10-CM | POA: Diagnosis not present

## 2018-12-25 DIAGNOSIS — Z888 Allergy status to other drugs, medicaments and biological substances status: Secondary | ICD-10-CM

## 2018-12-25 DIAGNOSIS — Z79891 Long term (current) use of opiate analgesic: Secondary | ICD-10-CM

## 2018-12-25 DIAGNOSIS — R52 Pain, unspecified: Secondary | ICD-10-CM | POA: Diagnosis not present

## 2018-12-25 DIAGNOSIS — L0390 Cellulitis, unspecified: Secondary | ICD-10-CM

## 2018-12-25 DIAGNOSIS — F329 Major depressive disorder, single episode, unspecified: Secondary | ICD-10-CM | POA: Diagnosis present

## 2018-12-25 DIAGNOSIS — J45909 Unspecified asthma, uncomplicated: Secondary | ICD-10-CM | POA: Diagnosis present

## 2018-12-25 DIAGNOSIS — E876 Hypokalemia: Secondary | ICD-10-CM | POA: Diagnosis present

## 2018-12-25 DIAGNOSIS — L97811 Non-pressure chronic ulcer of other part of right lower leg limited to breakdown of skin: Secondary | ICD-10-CM | POA: Diagnosis not present

## 2018-12-25 DIAGNOSIS — I83018 Varicose veins of right lower extremity with ulcer other part of lower leg: Secondary | ICD-10-CM | POA: Diagnosis not present

## 2018-12-25 LAB — CBC WITH DIFFERENTIAL/PLATELET
Abs Immature Granulocytes: 0.15 10*3/uL — ABNORMAL HIGH (ref 0.00–0.07)
Basophils Absolute: 0.1 10*3/uL (ref 0.0–0.1)
Basophils Relative: 0 %
Eosinophils Absolute: 0.2 10*3/uL (ref 0.0–0.5)
Eosinophils Relative: 1 %
HCT: 30.9 % — ABNORMAL LOW (ref 36.0–46.0)
Hemoglobin: 9 g/dL — ABNORMAL LOW (ref 12.0–15.0)
Immature Granulocytes: 1 %
Lymphocytes Relative: 7 %
Lymphs Abs: 1.4 10*3/uL (ref 0.7–4.0)
MCH: 25.6 pg — ABNORMAL LOW (ref 26.0–34.0)
MCHC: 29.1 g/dL — ABNORMAL LOW (ref 30.0–36.0)
MCV: 87.8 fL (ref 80.0–100.0)
Monocytes Absolute: 1.5 10*3/uL — ABNORMAL HIGH (ref 0.1–1.0)
Monocytes Relative: 7 %
Neutro Abs: 16.8 10*3/uL — ABNORMAL HIGH (ref 1.7–7.7)
Neutrophils Relative %: 84 %
Platelets: 418 10*3/uL — ABNORMAL HIGH (ref 150–400)
RBC: 3.52 MIL/uL — ABNORMAL LOW (ref 3.87–5.11)
RDW: 15.7 % — ABNORMAL HIGH (ref 11.5–15.5)
WBC: 20 10*3/uL — ABNORMAL HIGH (ref 4.0–10.5)
nRBC: 0 % (ref 0.0–0.2)

## 2018-12-25 LAB — COMPREHENSIVE METABOLIC PANEL
ALT: 7 U/L (ref 0–44)
AST: 11 U/L — ABNORMAL LOW (ref 15–41)
Albumin: 2.2 g/dL — ABNORMAL LOW (ref 3.5–5.0)
Alkaline Phosphatase: 65 U/L (ref 38–126)
Anion gap: 11 (ref 5–15)
BUN: 18 mg/dL (ref 6–20)
CO2: 24 mmol/L (ref 22–32)
Calcium: 8.9 mg/dL (ref 8.9–10.3)
Chloride: 102 mmol/L (ref 98–111)
Creatinine, Ser: 1.53 mg/dL — ABNORMAL HIGH (ref 0.44–1.00)
GFR calc Af Amer: 44 mL/min — ABNORMAL LOW (ref >60)
GFR calc non Af Amer: 38 mL/min — ABNORMAL LOW (ref >60)
Glucose, Bld: 124 mg/dL — ABNORMAL HIGH (ref 70–99)
Potassium: 3.3 mmol/L — ABNORMAL LOW (ref 3.5–5.1)
Sodium: 137 mmol/L (ref 135–145)
Total Bilirubin: 0.6 mg/dL (ref 0.3–1.2)
Total Protein: 8.9 g/dL — ABNORMAL HIGH (ref 6.5–8.1)

## 2018-12-25 LAB — I-STAT CG4 LACTIC ACID, ED: Lactic Acid, Venous: 1.71 mmol/L (ref 0.5–1.9)

## 2018-12-25 NOTE — ED Triage Notes (Signed)
Arrived via EMS from home. Patient has bilateral lower extremity  wounds for 2 years and home health nurse evaluated wounds and sent patient to the ED for evaluation for increase pain, swelling, and drainage clear and yellow.

## 2018-12-26 ENCOUNTER — Encounter (HOSPITAL_COMMUNITY): Payer: Self-pay | Admitting: General Practice

## 2018-12-26 ENCOUNTER — Other Ambulatory Visit: Payer: Self-pay

## 2018-12-26 DIAGNOSIS — Z7982 Long term (current) use of aspirin: Secondary | ICD-10-CM | POA: Diagnosis not present

## 2018-12-26 DIAGNOSIS — I5033 Acute on chronic diastolic (congestive) heart failure: Secondary | ICD-10-CM | POA: Diagnosis not present

## 2018-12-26 DIAGNOSIS — T148XXA Other injury of unspecified body region, initial encounter: Secondary | ICD-10-CM | POA: Diagnosis not present

## 2018-12-26 DIAGNOSIS — I87333 Chronic venous hypertension (idiopathic) with ulcer and inflammation of bilateral lower extremity: Secondary | ICD-10-CM | POA: Diagnosis not present

## 2018-12-26 DIAGNOSIS — B9689 Other specified bacterial agents as the cause of diseases classified elsewhere: Secondary | ICD-10-CM | POA: Diagnosis not present

## 2018-12-26 DIAGNOSIS — R2681 Unsteadiness on feet: Secondary | ICD-10-CM | POA: Diagnosis not present

## 2018-12-26 DIAGNOSIS — R918 Other nonspecific abnormal finding of lung field: Secondary | ICD-10-CM | POA: Diagnosis not present

## 2018-12-26 DIAGNOSIS — L97929 Non-pressure chronic ulcer of unspecified part of left lower leg with unspecified severity: Secondary | ICD-10-CM | POA: Diagnosis not present

## 2018-12-26 DIAGNOSIS — I83002 Varicose veins of unspecified lower extremity with ulcer of calf: Secondary | ICD-10-CM | POA: Diagnosis not present

## 2018-12-26 DIAGNOSIS — I87313 Chronic venous hypertension (idiopathic) with ulcer of bilateral lower extremity: Secondary | ICD-10-CM | POA: Diagnosis not present

## 2018-12-26 DIAGNOSIS — I5042 Chronic combined systolic (congestive) and diastolic (congestive) heart failure: Secondary | ICD-10-CM | POA: Diagnosis not present

## 2018-12-26 DIAGNOSIS — I872 Venous insufficiency (chronic) (peripheral): Secondary | ICD-10-CM | POA: Diagnosis not present

## 2018-12-26 DIAGNOSIS — L97209 Non-pressure chronic ulcer of unspecified calf with unspecified severity: Secondary | ICD-10-CM | POA: Diagnosis not present

## 2018-12-26 DIAGNOSIS — M6281 Muscle weakness (generalized): Secondary | ICD-10-CM | POA: Diagnosis not present

## 2018-12-26 DIAGNOSIS — I87309 Chronic venous hypertension (idiopathic) without complications of unspecified lower extremity: Secondary | ICD-10-CM | POA: Diagnosis present

## 2018-12-26 DIAGNOSIS — I83029 Varicose veins of left lower extremity with ulcer of unspecified site: Secondary | ICD-10-CM | POA: Diagnosis not present

## 2018-12-26 DIAGNOSIS — I10 Essential (primary) hypertension: Secondary | ICD-10-CM | POA: Diagnosis not present

## 2018-12-26 DIAGNOSIS — L089 Local infection of the skin and subcutaneous tissue, unspecified: Secondary | ICD-10-CM | POA: Diagnosis not present

## 2018-12-26 DIAGNOSIS — R2689 Other abnormalities of gait and mobility: Secondary | ICD-10-CM | POA: Diagnosis not present

## 2018-12-26 DIAGNOSIS — Z6839 Body mass index (BMI) 39.0-39.9, adult: Secondary | ICD-10-CM | POA: Diagnosis not present

## 2018-12-26 DIAGNOSIS — I130 Hypertensive heart and chronic kidney disease with heart failure and stage 1 through stage 4 chronic kidney disease, or unspecified chronic kidney disease: Secondary | ICD-10-CM | POA: Diagnosis not present

## 2018-12-26 DIAGNOSIS — Z95810 Presence of automatic (implantable) cardiac defibrillator: Secondary | ICD-10-CM | POA: Diagnosis not present

## 2018-12-26 DIAGNOSIS — N183 Chronic kidney disease, stage 3 (moderate): Secondary | ICD-10-CM | POA: Diagnosis present

## 2018-12-26 DIAGNOSIS — I83019 Varicose veins of right lower extremity with ulcer of unspecified site: Secondary | ICD-10-CM | POA: Diagnosis not present

## 2018-12-26 DIAGNOSIS — L03119 Cellulitis of unspecified part of limb: Secondary | ICD-10-CM | POA: Diagnosis not present

## 2018-12-26 DIAGNOSIS — Z888 Allergy status to other drugs, medicaments and biological substances status: Secondary | ICD-10-CM | POA: Diagnosis not present

## 2018-12-26 DIAGNOSIS — D649 Anemia, unspecified: Secondary | ICD-10-CM | POA: Diagnosis present

## 2018-12-26 DIAGNOSIS — D500 Iron deficiency anemia secondary to blood loss (chronic): Secondary | ICD-10-CM | POA: Diagnosis not present

## 2018-12-26 DIAGNOSIS — Z48817 Encounter for surgical aftercare following surgery on the skin and subcutaneous tissue: Secondary | ICD-10-CM | POA: Diagnosis not present

## 2018-12-26 DIAGNOSIS — L97919 Non-pressure chronic ulcer of unspecified part of right lower leg with unspecified severity: Secondary | ICD-10-CM

## 2018-12-26 DIAGNOSIS — F329 Major depressive disorder, single episode, unspecified: Secondary | ICD-10-CM | POA: Diagnosis not present

## 2018-12-26 DIAGNOSIS — J45909 Unspecified asthma, uncomplicated: Secondary | ICD-10-CM | POA: Diagnosis not present

## 2018-12-26 DIAGNOSIS — Z7401 Bed confinement status: Secondary | ICD-10-CM | POA: Diagnosis not present

## 2018-12-26 DIAGNOSIS — S81801A Unspecified open wound, right lower leg, initial encounter: Secondary | ICD-10-CM | POA: Diagnosis not present

## 2018-12-26 DIAGNOSIS — R41841 Cognitive communication deficit: Secondary | ICD-10-CM | POA: Diagnosis not present

## 2018-12-26 DIAGNOSIS — M2550 Pain in unspecified joint: Secondary | ICD-10-CM | POA: Diagnosis not present

## 2018-12-26 DIAGNOSIS — E785 Hyperlipidemia, unspecified: Secondary | ICD-10-CM | POA: Diagnosis not present

## 2018-12-26 DIAGNOSIS — E43 Unspecified severe protein-calorie malnutrition: Secondary | ICD-10-CM | POA: Diagnosis not present

## 2018-12-26 DIAGNOSIS — Z79899 Other long term (current) drug therapy: Secondary | ICD-10-CM | POA: Diagnosis not present

## 2018-12-26 DIAGNOSIS — I878 Other specified disorders of veins: Secondary | ICD-10-CM | POA: Diagnosis present

## 2018-12-26 DIAGNOSIS — E876 Hypokalemia: Secondary | ICD-10-CM | POA: Diagnosis not present

## 2018-12-26 DIAGNOSIS — I5022 Chronic systolic (congestive) heart failure: Secondary | ICD-10-CM | POA: Diagnosis not present

## 2018-12-26 DIAGNOSIS — D631 Anemia in chronic kidney disease: Secondary | ICD-10-CM | POA: Diagnosis present

## 2018-12-26 DIAGNOSIS — D869 Sarcoidosis, unspecified: Secondary | ICD-10-CM | POA: Diagnosis not present

## 2018-12-26 DIAGNOSIS — D638 Anemia in other chronic diseases classified elsewhere: Secondary | ICD-10-CM | POA: Diagnosis not present

## 2018-12-26 DIAGNOSIS — L03115 Cellulitis of right lower limb: Secondary | ICD-10-CM | POA: Diagnosis not present

## 2018-12-26 DIAGNOSIS — L03116 Cellulitis of left lower limb: Secondary | ICD-10-CM | POA: Diagnosis not present

## 2018-12-26 DIAGNOSIS — I110 Hypertensive heart disease with heart failure: Secondary | ICD-10-CM | POA: Diagnosis not present

## 2018-12-26 LAB — MRSA PCR SCREENING: MRSA by PCR: NEGATIVE

## 2018-12-26 MED ORDER — ONDANSETRON HCL 4 MG/2ML IJ SOLN: 4.0000 mg | Freq: Four times a day (QID) | INTRAMUSCULAR | Status: DC | PRN

## 2018-12-26 MED ORDER — ACETAMINOPHEN 325 MG PO TABS: 650.0000 mg | ORAL_TABLET | Freq: Four times a day (QID) | ORAL | Status: DC | PRN

## 2018-12-26 MED ORDER — MORPHINE SULFATE (PF) 4 MG/ML IV SOLN
4.0000 mg | Freq: Once | INTRAVENOUS | Status: AC
  Administered 2018-12-26: 4 mg via INTRAVENOUS
  Filled 2018-12-26: qty 1

## 2018-12-26 MED ORDER — CARVEDILOL 6.25 MG PO TABS
6.2500 mg | ORAL_TABLET | Freq: Two times a day (BID) | ORAL | Status: DC
  Administered 2018-12-27 – 2019-01-09 (×24): 6.25 mg via ORAL
  Filled 2018-12-26 (×29): qty 1

## 2018-12-26 MED ORDER — SODIUM CHLORIDE 0.9% FLUSH
3.0000 mL | Freq: Two times a day (BID) | INTRAVENOUS | Status: DC
  Administered 2018-12-26 – 2019-01-09 (×10): 3 mL via INTRAVENOUS

## 2018-12-26 MED ORDER — FUROSEMIDE 40 MG PO TABS
60.0000 mg | ORAL_TABLET | Freq: Every day | ORAL | Status: DC
  Administered 2018-12-26 – 2018-12-27 (×2): 60 mg via ORAL
  Filled 2018-12-26 (×2): qty 1

## 2018-12-26 MED ORDER — DOCUSATE SODIUM 100 MG PO CAPS
100.0000 mg | ORAL_CAPSULE | Freq: Every day | ORAL | Status: DC
  Administered 2018-12-26 – 2019-01-04 (×10): 100 mg via ORAL
  Filled 2018-12-26 (×10): qty 1

## 2018-12-26 MED ORDER — VANCOMYCIN HCL IN DEXTROSE 1-5 GM/200ML-% IV SOLN
1000.0000 mg | INTRAVENOUS | Status: DC
  Administered 2018-12-27: 1000 mg via INTRAVENOUS
  Filled 2018-12-26: qty 200

## 2018-12-26 MED ORDER — SODIUM CHLORIDE 0.9% FLUSH: 3.0000 mL | INTRAVENOUS | Status: DC | PRN

## 2018-12-26 MED ORDER — HYDROCODONE-ACETAMINOPHEN 5-325 MG PO TABS
1.0000 | ORAL_TABLET | Freq: Four times a day (QID) | ORAL | Status: DC | PRN
  Administered 2018-12-26 – 2019-01-09 (×33): 1 via ORAL
  Filled 2018-12-26 (×33): qty 1

## 2018-12-26 MED ORDER — ACETAMINOPHEN 650 MG RE SUPP: 650.0000 mg | Freq: Four times a day (QID) | RECTAL | Status: DC | PRN

## 2018-12-26 MED ORDER — ONDANSETRON HCL 4 MG PO TABS: 4.0000 mg | ORAL_TABLET | Freq: Four times a day (QID) | ORAL | Status: DC | PRN

## 2018-12-26 MED ORDER — POTASSIUM CHLORIDE CRYS ER 20 MEQ PO TBCR
40.0000 meq | EXTENDED_RELEASE_TABLET | ORAL | Status: AC
  Administered 2018-12-26: 40 meq via ORAL
  Filled 2018-12-26: qty 2

## 2018-12-26 MED ORDER — ALBUTEROL SULFATE (2.5 MG/3ML) 0.083% IN NEBU: 2.5000 mg | INHALATION_SOLUTION | Freq: Four times a day (QID) | RESPIRATORY_TRACT | Status: DC | PRN

## 2018-12-26 MED ORDER — PIPERACILLIN-TAZOBACTAM 3.375 G IVPB 30 MIN
3.3750 g | Freq: Once | INTRAVENOUS | Status: AC
  Administered 2018-12-26: 3.375 g via INTRAVENOUS
  Filled 2018-12-26: qty 50

## 2018-12-26 MED ORDER — SODIUM CHLORIDE 0.9 % IV SOLN: 250.0000 mL | INTRAVENOUS | Status: DC | PRN

## 2018-12-26 MED ORDER — ACETAMINOPHEN 500 MG PO TABS
1000.0000 mg | ORAL_TABLET | Freq: Four times a day (QID) | ORAL | Status: DC | PRN
  Administered 2019-01-01: 1000 mg via ORAL
  Filled 2018-12-26 (×2): qty 2

## 2018-12-26 MED ORDER — VANCOMYCIN HCL 10 G IV SOLR
2000.0000 mg | Freq: Once | INTRAVENOUS | Status: AC
  Administered 2018-12-26: 2000 mg via INTRAVENOUS
  Filled 2018-12-26 (×2): qty 2000

## 2018-12-26 MED ORDER — PIPERACILLIN-TAZOBACTAM 3.375 G IVPB
3.3750 g | Freq: Three times a day (TID) | INTRAVENOUS | Status: DC
  Administered 2018-12-26 – 2018-12-30 (×12): 3.375 g via INTRAVENOUS
  Filled 2018-12-26 (×12): qty 50

## 2018-12-26 MED ORDER — FEBUXOSTAT 40 MG PO TABS
40.0000 mg | ORAL_TABLET | Freq: Every day | ORAL | Status: DC
  Administered 2018-12-26 – 2019-01-09 (×14): 40 mg via ORAL
  Filled 2018-12-26 (×17): qty 1

## 2018-12-26 MED ORDER — MORPHINE SULFATE (PF) 2 MG/ML IV SOLN
2.0000 mg | INTRAVENOUS | Status: DC | PRN
  Administered 2018-12-26 – 2019-01-07 (×18): 2 mg via INTRAVENOUS
  Filled 2018-12-26 (×23): qty 1

## 2018-12-26 MED ORDER — ENOXAPARIN SODIUM 40 MG/0.4ML ~~LOC~~ SOLN
40.0000 mg | SUBCUTANEOUS | Status: DC
  Administered 2018-12-26 – 2019-01-09 (×12): 40 mg via SUBCUTANEOUS
  Filled 2018-12-26 (×12): qty 0.4

## 2018-12-26 NOTE — Consult Note (Signed)
[place] Nurse wound consult note After speaking with the [facility] coordinator, I learned that although the record shows Dr. Fuller Plan as the attending, that is incorrect.  The coordinator is working to remedy the information and determine the correct contact for me. Val Riles, RN, MSN, CWOCN, CNS-BC, pager [phone]

## 2018-12-26 NOTE — Consult Note (Signed)
[place] Nurse wound consult note Per the [facility] Coordinator information, I have text paged Dr. Steward Ros at [phone] that I recommend a surgical consult for the patient's legs. Val Riles, RN, MSN, CWOCN, CNS-BC, pager [phone]

## 2018-12-26 NOTE — Consult Note (Signed)
[place] Nurse wound consult note Patient being cared for in [place].  Multiple female family members present. Reason for Consult: BLE wounds Wound type: Documented as Venous ulcers per note from Quincy Carnes 12/26/18 at 1:22 a.m.   Patient states she saw Dr. Donnetta Hutching once in July of 2019 and has not had further evaluation since that time for the leg ulcers. Measurements: Left lower leg ulcer measures approximately 15 cm x 15 cm is quite deep and highly necrotic.  Right lower leg ulcer measures approximately 15 cm x 18 cm is quite deep and highly necrotic.  Foul odor, heavy drainage, and profound pain present bilaterally.  Patient states the wounds have never been biopsied. Wound bed: Necrotic yellow and black bilaterally Drainage (amount, consistency, odor) Heavy, malodorous Periwound: Orange peel appearance Dressing procedure/placement/frequency: I have explained to the patient that I believe the most important thing that can be done for her at this time is to obtain a surgical consult.  I have reached out to Dr. Fuller Plan via pager [phone] and am awaiting a response.  Val Riles, RN, MSN, CWOCN, CNS-BC, pager [phone]

## 2018-12-26 NOTE — H&P (View-Only) (Signed)
ORTHOPAEDIC CONSULTATION  REQUESTING PHYSICIAN: Phillips Grout, MD  Chief Complaint: Chronic ulcerations bilateral lower extremities.  HPI: Kari [name] is a 58 y.o. female who presents with chronic ulcerations bilateral lower extremities.  She has morbid obesity, severe protein caloric malnutrition, with large foul-smelling necrotic ulcers encompassing almost the entire leg bilaterally.  Patient states she has been undergoing wound care dressing changes at home 3 times a week.  Past Medical History:  Diagnosis Date  . Asthma   . CHF (congestive heart failure) ([place]) 03/12/2014   a. EF 40-45% by echo in 06/2017 with normal cors by cath. ICD placed following VT arrest  . Depression   . Headache(784.0)   . Hyperlipidemia   . Hypertension   . Lung nodules   . Renal disorder   . Sarcoidosis   . Ulcer    recurring, from chronic venous insufficiency   Past Surgical History:  Procedure Laterality Date  . cataract surgery Left 07-2013  . ICD IMPLANT N/A 07/05/2017   Procedure: ICD Implant;  Surgeon: Constance Haw, MD;  Location: [facility];  Service: Cardiovascular;  Laterality: N/A;  . LEFT HEART CATH AND CORONARY ANGIOGRAPHY N/A 07/03/2017   Procedure: Left Heart Cath and Coronary Angiography;  Surgeon: Belva Crome, MD;  Location: [facility];  Service: Cardiovascular;  Laterality: N/A;   Social History   Socioeconomic History  . Marital status: Single    Spouse name: Not on file  . Number of children: 1  . Years of education: Not on file  . Highest education level: Not on file  Occupational History  . Not on file  Social Needs  . Financial resource strain: Not on file  . Food insecurity:    Worry: Not on file    Inability: Not on file  . Transportation needs:    Medical: Not on file    Non-medical: Not on file  Tobacco Use  . Smoking status: Never Smoker  . Smokeless tobacco: Never Used  Substance and Sexual Activity  . Alcohol use:  No  . Drug use: No  . Sexual activity: Not Currently  Lifestyle  . Physical activity:    Days per week: Not on file    Minutes per session: Not on file  . Stress: Not on file  Relationships  . Social connections:    Talks on phone: Not on file    Gets together: Not on file    Attends religious service: Not on file    Active member of club or organization: Not on file    Attends meetings of clubs or organizations: Not on file    Relationship status: Not on file  Other Topics Concern  . Not on file  Social History Narrative   Lives alone.   Family History  Problem Relation Age of Onset  . Heart failure Mother   . Diabetes Mellitus II Mother   . Hypertension Mother   . Cancer Mother        unknown type  . Heart disease Father   . Stroke Father   . Diabetes Mellitus II Father    - negative except otherwise stated in the family history section Allergies  Allergen Reactions  . Tramadol Other (See Comments)    hallucination   Prior to Admission medications   Medication Sig Start Date End Date Taking? Authorizing Provider  acetaminophen (TYLENOL) 500 MG tablet Take 1,000 mg by mouth every 6 (six) hours as needed for  mild pain.   Yes [provider]  albuterol (PROVENTIL HFA;VENTOLIN HFA) 108 (90 Base) MCG/ACT inhaler Inhale 2 puffs into the lungs every 6 (six) hours as needed for shortness of breath. 07/06/17  Yes Mikhail, Velta Addison, DO  aspirin EC 81 MG tablet Take 81 mg by mouth daily.   Yes [provider]  carvedilol (COREG) 6.25 MG tablet Take 6.25 mg by mouth 2 (two) times daily. 09/04/18  Yes [provider]  Cholecalciferol (VITAMIN D) 50 MCG (2000 UT) CAPS Take 2,000 Units by mouth daily.   Yes [provider]  docusate sodium (COLACE) 100 MG capsule Take 100 mg by mouth daily.   Yes [provider]  febuxostat (ULORIC) 40 MG tablet Take 40 mg by mouth daily. 09/04/18  Yes [provider]  furosemide (LASIX) 40 MG tablet  Take 60 mg by mouth daily.  08/22/18  Yes [provider]  HYDROcodone-acetaminophen (NORCO/VICODIN) 5-325 MG tablet Take 1 tablet by mouth every 6 (six) hours as needed for moderate pain. 11/26/18  Yes Rutherford Guys, MD  loratadine (CLARITIN) 10 MG tablet Take 10 mg by mouth daily as needed for allergies.    Yes [provider]  SANTYL ointment APPLY TO WOUND AS DIRECTED PER BLE TREATMENT. Patient taking differently: Apply 1 application topically See admin instructions. With dressing changes 07/09/18  Yes Rutherford Guys, MD  ondansetron (ZOFRAN) 4 MG tablet Take 1 tablet (4 mg total) by mouth every 6 (six) hours as needed for nausea. Patient not taking: Reported on 10/14/2018 04/06/17   Sinda Du, MD   No results found. - pertinent xrays, CT, MRI studies were reviewed and independently interpreted  Positive ROS: All other systems have been reviewed and were otherwise negative with the exception of those mentioned in the HPI and as above.  Physical Exam: General: Alert, no acute distress Psychiatric: Patient is competent for consent with normal mood and affect Lymphatic: No axillary or cervical lymphadenopathy Cardiovascular: No pedal edema Respiratory: No cyanosis, no use of accessory musculature GI: No organomegaly, abdomen is soft and non-tender    Images:  @ENCIMAGES @  Labs:  Lab Results  Component Value Date   HGBA1C 5.6 12/17/2016   HGBA1C 5.5 03/12/2014   HGBA1C 6.0 (H) 06/21/2012   REPTSTATUS PENDING 12/25/2018   GRAMSTAIN  06/27/2017    ABUNDANT WBC PRESENT,BOTH PMN AND MONONUCLEAR RARE GRAM NEGATIVE RODS RARE GRAM NEGATIVE COCCI IN PAIRS    CULT  12/25/2018    NO GROWTH < 24 HOURS Performed at [facility], [address], [place], [place] [address]     Lab Results  Component Value Date   ALBUMIN 2.2 (L) 12/25/2018   ALBUMIN 3.4 (L) 09/11/2018   ALBUMIN 3.3 (L) 05/10/2018    Neurologic: Patient does not have protective  sensation bilateral lower extremities.   MUSCULOSKELETAL:   Skin: Examination of both legs she has foul-smelling draining necrotic ulcers involving almost the entire leg laterally from the knee to the ankle worse on the right than the left.  Her legs are painful.  Patient has a palpable dorsalis pedis pulse bilaterally no arterial insufficiency.  Her albumin is 2.2 with severe protein caloric malnutrition.  Assessment: Assessment: Severe protein caloric malnutrition with morbid obesity with large necrotic venous stasis ulcers both lower extremities with foul-smelling drainage and necrotic tissue.  Plan: Plan: Discussed with the patient we can proceed with limb salvage intervention with surgical intervention starting Tuesday or Wednesday with follow-up surgery on Friday  and subsequent surgeries after that.  Discussed that if the depth of the necrotic tissue is too great we would not be able to proceed with limb salvage intervention.  Risks and benefits were discussed patient states she understands wished to proceed with limb salvage intervention.  I will have her start dressing changes at this time continue IV vancomycin and Zosyn antibiotics to decrease the soft tissue cellulitis.  Thank you for the consult and the opportunity to see Kari [name], [place] [phone] 5:16 PM

## 2018-12-26 NOTE — ED Provider Notes (Signed)
[facility] EMERGENCY DEPARTMENT Provider Note   CSN: [id number] Arrival date & time: 12/25/18  1512     History   Chief Complaint Chief Complaint  Patient presents with  . Leg Pain  . Wound Check    HPI Kari [name] is a 58 y.o. female.  The history is provided by the patient and medical records.     58 year old female with history of asthma, CHF, depression, hyperlipidemia, sarcoidosis, chronic leg ulcers secondary to venous insufficiency, presenting to the ED with infected leg wounds.  Patient reports she has been followed by vascular, Dr. early.  They have been doing compression dressings and she has wound care nurse come to her home 3 times weekly to change her dressings.  They came today and noticed a lot of drainage and foul odor from her wounds and became concerned for infection so sent her to the ED.  Patient has not noticed any fever.  She has had increased pain in her legs from the wounds.  No changes in distal sensation.  She has not been on any recent antibiotic therapy.  Past Medical History:  Diagnosis Date  . Asthma   . CHF (congestive heart failure) ([place]) 03/12/2014   a. EF 40-45% by echo in 06/2017 with normal cors by cath. ICD placed following VT arrest  . Depression   . Headache(784.0)   . Hyperlipidemia   . Hypertension   . Lung nodules   . Renal disorder   . Sarcoidosis   . Ulcer    recurring, from chronic venous insufficiency    Patient Active Problem List   Diagnosis Date Noted  . History of fall 05/10/2018  . Hyperkalemia 02/21/2018  . Hypotension 02/21/2018  . Sepsis ([place]) 02/21/2018  . Encounter for central line placement   . Mitral regurgitation 10/30/2017  . Acute kidney injury ([place]) 07/21/2017  . Generalized weakness 07/20/2017  . Shock circulatory ([place])   . Acute respiratory failure with hypoxia ([place])   . Pulmonary edema   . Cardiac arrest ([place]) 06/26/2017  . Ventricular fibrillation ([place])   . Acute on  chronic diastolic heart failure ([place])   . Abnormal ECG   . Cellulitis 04/02/2017  . AKI (acute kidney injury) ([place]) 12/17/2016  . Cellulitis and abscess of right lower extremity 12/17/2016  . Syncope and collapse 12/17/2016  . Peripheral edema 12/17/2016  . Hypokalemia 12/17/2016  . Anemia 12/17/2016  . Acute diastolic CHF (congestive heart failure) ([place]) 03/14/2014  . CHF (congestive heart failure) ([place]) 03/12/2014  . Sarcoidosis ([place]) 03/12/2014  . Severe sepsis ([place]) 10/17/2013  . ARF (acute renal failure) ([place]) 10/08/2013  . Cellulitis and abscess 10/08/2013  . Morbid obesity ([place]) 10/08/2013  . Volume depletion 10/08/2013  . Venous (peripheral) insufficiency 10/28/2012  . Mediastinal lymphadenopathy 10/16/2012  . Pulmonary nodules 10/16/2012  . 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 Chest pain 09/02/2012  . Asthma   . Hypertension   . Depression   . Venous stasis ulcers ([place]) 07/29/2012  . Varicose veins of lower extremities with ulcer ([place]) 07/08/2012  . Venous ulcer of leg ([place]) 07/08/2012    Past Surgical History:  Procedure Laterality Date  . cataract surgery Left 07-2013  . ICD IMPLANT N/A 07/05/2017   Procedure: ICD Implant;  Surgeon: Constance Haw, MD;  Location: [facility];  Service: Cardiovascular;  Laterality: N/A;  . LEFT HEART CATH AND CORONARY ANGIOGRAPHY N/A 07/03/2017   Procedure: Left Heart  Cath and Coronary Angiography;  Surgeon: Belva Crome, MD;  Location: [facility];  Service: Cardiovascular;  Laterality: N/A;     OB History   No obstetric history on file.      Home Medications    Prior to Admission medications   Medication Sig Start Date End Date Taking? Authorizing Provider  albuterol (PROVENTIL HFA;VENTOLIN HFA) 108 (90 Base) MCG/ACT inhaler Inhale 2 puffs into the lungs every 6 (six) hours as needed for shortness of breath. 07/06/17   Cristal Ford, DO  aspirin  EC 81 MG tablet Take 81 mg by mouth daily.    [provider]  carvedilol (COREG) 6.25 MG tablet Take 6.25 mg by mouth 2 (two) times daily. 09/04/18   [provider]  doxycycline (VIBRA-TABS) 100 MG tablet Take 1 tablet (100 mg total) by mouth 2 (two) times daily. 09/27/18   Rutherford Guys, MD  doxycycline (VIBRAMYCIN) 100 MG capsule Take 100 mg by mouth 2 (two) times daily. 09/29/18   [provider]  febuxostat (ULORIC) 40 MG tablet Take 40 mg by mouth daily. 09/04/18   [provider]  furosemide (LASIX) 40 MG tablet Take 40 mg by mouth daily. 08/22/18   [provider]  HYDROcodone-acetaminophen (NORCO/VICODIN) 5-325 MG tablet Take 1 tablet by mouth every 6 (six) hours as needed for moderate pain. 11/26/18   Rutherford Guys, MD  loratadine (CLARITIN) 10 MG tablet Take 10 mg by mouth daily.    [provider]  ondansetron (ZOFRAN) 4 MG tablet Take 1 tablet (4 mg total) by mouth every 6 (six) hours as needed for nausea. Patient not taking: Reported on 10/14/2018 04/06/17   Sinda Du, MD  SANTYL ointment APPLY TO WOUND AS DIRECTED PER BLE TREATMENT. 07/09/18   Rutherford Guys, MD  SPS 15 GM/60ML suspension TAKE AS DIRECTED AS A 1 TIME DOSE 07/17/18   [provider]  Vitamin D, Ergocalciferol, (DRISDOL) 50000 units CAPS capsule TAKE ONE CAPSULE BY MOUTH EVERY WEEK FOR EIGHT WEEKS AND THEN ONE CAPSULE EVERY MONTH THEREAFTER 08/19/18   [provider]    Family History Family History  Problem Relation Age of Onset  . Heart failure Mother   . Diabetes Mellitus II Mother   . Hypertension Mother   . Cancer Mother        unknown type  . Heart disease Father   . Stroke Father   . Diabetes Mellitus II Father     Social History Social History   Tobacco Use  . Smoking status: Never Smoker  . Smokeless tobacco: Never Used  Substance Use Topics  . Alcohol use: No  . Drug use: No     Allergies   Tramadol   Review  of Systems Review of Systems  Skin: Positive for wound.  All other systems reviewed and are negative.    Physical Exam Updated Vital Signs BP 122/66   Pulse 90   Temp 98.5 F (36.9 C) (Oral)   Resp 18   Ht 5\' 7"  (1.702 m)   Wt 115.7 kg   LMP 11/21/2011   SpO2 98%   BMI 39.94 kg/m   Physical Exam Vitals signs and nursing note reviewed.  Constitutional:      Appearance: She is well-developed.  HENT:     Head: Normocephalic and atraumatic.  Eyes:     Conjunctiva/sclera: Conjunctivae normal.     Pupils: Pupils are equal, round, and reactive to light.  Neck:  Musculoskeletal: Normal range of motion.  Cardiovascular:     Rate and Rhythm: Normal rate and regular rhythm.     Heart sounds: Normal heart sounds.  Pulmonary:     Effort: Pulmonary effort is normal.     Breath sounds: Normal breath sounds.  Abdominal:     General: Bowel sounds are normal.     Palpations: Abdomen is soft.  Musculoskeletal: Normal range of motion.     Comments: Extensive wounds of the lower legs near the ankles, right worse than left; both wounds appear to have very foul smelling, purulent drainage from the with some surrounding cellulitic changes and small blister formation; there is no tissue crepitus; moving toes normally; distal sensation seems at baseline (see photos below)  Skin:    General: Skin is warm and dry.  Neurological:     Mental Status: She is alert and oriented to person, place, and time.      Marland Kitchen right leg    ^^ left leg  ED Treatments / Results  Labs (all labs ordered are listed, but only abnormal results are displayed) Labs Reviewed  COMPREHENSIVE METABOLIC PANEL - Abnormal; Notable for the following components:      Result Value   Potassium 3.3 (*)    Glucose, Bld 124 (*)    Creatinine, Ser 1.53 (*)    Total Protein 8.9 (*)    Albumin 2.2 (*)    AST 11 (*)    GFR calc non Af Amer 38 (*)    GFR calc Af Amer 44 (*)    All other components within normal  limits  CBC WITH DIFFERENTIAL/PLATELET - Abnormal; Notable for the following components:   WBC 20.0 (*)    RBC 3.52 (*)    Hemoglobin 9.0 (*)    HCT 30.9 (*)    MCH 25.6 (*)    MCHC 29.1 (*)    RDW 15.7 (*)    Platelets 418 (*)    Neutro Abs 16.8 (*)    Monocytes Absolute 1.5 (*)    Abs Immature Granulocytes 0.15 (*)    All other components within normal limits  CULTURE, BLOOD (ROUTINE X 2)  CULTURE, BLOOD (ROUTINE X 2)  I-STAT CG4 LACTIC ACID, ED  I-STAT CG4 LACTIC ACID, ED    EKG None  Radiology No results found.  Procedures Procedures (including critical care time)  Medications Ordered in ED Medications  vancomycin (VANCOCIN) IVPB 1000 mg/200 mL premix (has no administration in time range)  piperacillin-tazobactam (ZOSYN) IVPB 3.375 g (0 g Intravenous Stopped 12/26/18 0352)  morphine 4 MG/ML injection 4 mg (4 mg Intravenous Given 12/26/18 0204)  vancomycin (VANCOCIN) 2,000 mg in sodium chloride 0.9 % 500 mL IVPB (0 mg Intravenous Stopped 12/26/18 0507)  potassium chloride SA (K-DUR,KLOR-CON) CR tablet 40 mEq (40 mEq Oral Given 12/26/18 0557)  morphine 4 MG/ML injection 4 mg (4 mg Intravenous Given 12/26/18 0559)     Initial Impression / Assessment and Plan / ED Course  I have reviewed the triage vital signs and the nursing notes.  Pertinent labs & imaging results that were available during my care of the patient were reviewed by me and considered in my medical decision making (see chart for details).  58 year old female sitting wound care nurse for infected wounds of her bilateral lower extremities.  Patient has chronic venous stasis ulcers of both lower legs, followed by vascular surgery, Dr. early.  She has wound care/dressing changes 3 times weekly.  When they came today and  took off her old dressing they noticed a lot of pus on her wounds and foul odor.  States she has had some increased pain in her lower extremities but no alterations in sensation.  She has not had any  noted fevers.  She is afebrile and nontoxic in appearance here.  Labs were obtained from triage, normal lactate but WBC count 20,000.  Renal function appears at baseline.  Patient's legs do appear grossly infected, see photos above.  There is no tissue crepitus.  I feel she will require admission for IV antibiotics.  Blood cultures have been sent, started on broad-spectrum vancomycin and Zosyn.  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  Patient agreeable with admission.  Discussed with Dr. Tamala Julian, holding orders have been placed.  Morning hospitalist team to admit.  Final Clinical Impressions(s) / ED Diagnoses   Final diagnoses:  Venous stasis ulcers of both lower extremities ([place])  Infected wound    ED Discharge Orders    None       Larene Pickett, PA-C 12/26/18 0615    Fatima Blank, MD 12/26/18 [phone]

## 2018-12-26 NOTE — ED Notes (Signed)
ED Provider at bedside. 

## 2018-12-26 NOTE — Progress Notes (Signed)
Pharmacy Antibiotic Note  Kari [name] is a 58 y.o. female admitted on 12/25/2018 with wound infection.  Pharmacy has been consulted for Vancomycin and Zosyn dosing.  Plan: Continue vancomycin 1g IV Q24h Continue Zosyn 3.375 gm IV q8h (4 hour infusion) Monitor clinical picture, renal function,vanc levels prn F/U imaging, C&S, abx deescalation / LOT  Height: 5\' 7"  (170.2 cm) Weight: 255 lb (115.7 kg) IBW/kg (Calculated) : 61.6  Temp (24hrs), Avg:98.3 F (36.8 C), Min:97.8 F (36.6 C), Max:98.5 F (36.9 C)  Recent Labs  Lab 12/25/18 1521 12/25/18 1606  WBC 20.0*  --   CREATININE 1.53*  --   LATICACIDVEN  --  1.71    Estimated Creatinine Clearance: 53.9 mL/min (A) (by C-G formula based on SCr of 1.53 mg/dL (H)).    Allergies  Allergen Reactions  . Tramadol     hallucination    Kari [name] 12/26/2018 8:32 AM

## 2018-12-26 NOTE — Progress Notes (Signed)
Pharmacy Antibiotic Note  Kari [name] is a 58 y.o. female admitted on 12/25/2018 with wound infection.  Pharmacy has been consulted for Vancomycin dosing. WBC is elevated. Noted renal dysfunction (mild bump from baseline).   Plan: Vancomycin 2000 mg IV x 1, then 1000 mg IV q24h -Estimated AUC: 492 Zosyn x 1 in the ED, f/u additional gram negative coverage  Trend WBC, temp, renal function  F/U infectious work-up Drug levels as indicated   Height: 5\' 7"  (170.2 cm) Weight: 255 lb (115.7 kg) IBW/kg (Calculated) : 61.6  Temp (24hrs), Avg:98.2 F (36.8 C), Min:97.8 F (36.6 C), Max:98.5 F (36.9 C)  Recent Labs  Lab 12/25/18 1521 12/25/18 1606  WBC 20.0*  --   CREATININE 1.53*  --   LATICACIDVEN  --  1.71    Estimated Creatinine Clearance: 53.9 mL/min (A) (by C-G formula based on SCr of 1.53 mg/dL (H)).    Allergies  Allergen Reactions  . Tramadol     hallucination     Narda Bonds 12/26/2018 5:12 AM

## 2018-12-26 NOTE — Consult Note (Signed)
ORTHOPAEDIC CONSULTATION  REQUESTING PHYSICIAN: Phillips Grout, MD  Chief Complaint: Chronic ulcerations bilateral lower extremities.  HPI: Kari [name] is a 58 y.o. female who presents with chronic ulcerations bilateral lower extremities.  She has morbid obesity, severe protein caloric malnutrition, with large foul-smelling necrotic ulcers encompassing almost the entire leg bilaterally.  Patient states she has been undergoing wound care dressing changes at home 3 times a week.  Past Medical History:  Diagnosis Date  . Asthma   . CHF (congestive heart failure) ([place]) 03/12/2014   a. EF 40-45% by echo in 06/2017 with normal cors by cath. ICD placed following VT arrest  . Depression   . Headache(784.0)   . Hyperlipidemia   . Hypertension   . Lung nodules   . Renal disorder   . Sarcoidosis   . Ulcer    recurring, from chronic venous insufficiency   Past Surgical History:  Procedure Laterality Date  . cataract surgery Left 07-2013  . ICD IMPLANT N/A 07/05/2017   Procedure: ICD Implant;  Surgeon: Constance Haw, MD;  Location: [facility];  Service: Cardiovascular;  Laterality: N/A;  . LEFT HEART CATH AND CORONARY ANGIOGRAPHY N/A 07/03/2017   Procedure: Left Heart Cath and Coronary Angiography;  Surgeon: Belva Crome, MD;  Location: [facility];  Service: Cardiovascular;  Laterality: N/A;   Social History   Socioeconomic History  . Marital status: Single    Spouse name: Not on file  . Number of children: 1  . Years of education: Not on file  . Highest education level: Not on file  Occupational History  . Not on file  Social Needs  . Financial resource strain: Not on file  . Food insecurity:    Worry: Not on file    Inability: Not on file  . Transportation needs:    Medical: Not on file    Non-medical: Not on file  Tobacco Use  . Smoking status: Never Smoker  . Smokeless tobacco: Never Used  Substance and Sexual Activity  . Alcohol use:  No  . Drug use: No  . Sexual activity: Not Currently  Lifestyle  . Physical activity:    Days per week: Not on file    Minutes per session: Not on file  . Stress: Not on file  Relationships  . Social connections:    Talks on phone: Not on file    Gets together: Not on file    Attends religious service: Not on file    Active member of club or organization: Not on file    Attends meetings of clubs or organizations: Not on file    Relationship status: Not on file  Other Topics Concern  . Not on file  Social History Narrative   Lives alone.   Family History  Problem Relation Age of Onset  . Heart failure Mother   . Diabetes Mellitus II Mother   . Hypertension Mother   . Cancer Mother        unknown type  . Heart disease Father   . Stroke Father   . Diabetes Mellitus II Father    - negative except otherwise stated in the family history section Allergies  Allergen Reactions  . Tramadol Other (See Comments)    hallucination   Prior to Admission medications   Medication Sig Start Date End Date Taking? Authorizing Provider  acetaminophen (TYLENOL) 500 MG tablet Take 1,000 mg by mouth every 6 (six) hours as needed for  mild pain.   Yes [provider]  albuterol (PROVENTIL HFA;VENTOLIN HFA) 108 (90 Base) MCG/ACT inhaler Inhale 2 puffs into the lungs every 6 (six) hours as needed for shortness of breath. 07/06/17  Yes Mikhail, Velta Addison, DO  aspirin EC 81 MG tablet Take 81 mg by mouth daily.   Yes [provider]  carvedilol (COREG) 6.25 MG tablet Take 6.25 mg by mouth 2 (two) times daily. 09/04/18  Yes [provider]  Cholecalciferol (VITAMIN D) 50 MCG (2000 UT) CAPS Take 2,000 Units by mouth daily.   Yes [provider]  docusate sodium (COLACE) 100 MG capsule Take 100 mg by mouth daily.   Yes [provider]  febuxostat (ULORIC) 40 MG tablet Take 40 mg by mouth daily. 09/04/18  Yes [provider]  furosemide (LASIX) 40 MG tablet  Take 60 mg by mouth daily.  08/22/18  Yes [provider]  HYDROcodone-acetaminophen (NORCO/VICODIN) 5-325 MG tablet Take 1 tablet by mouth every 6 (six) hours as needed for moderate pain. 11/26/18  Yes Rutherford Guys, MD  loratadine (CLARITIN) 10 MG tablet Take 10 mg by mouth daily as needed for allergies.    Yes [provider]  SANTYL ointment APPLY TO WOUND AS DIRECTED PER BLE TREATMENT. Patient taking differently: Apply 1 application topically See admin instructions. With dressing changes 07/09/18  Yes Rutherford Guys, MD  ondansetron (ZOFRAN) 4 MG tablet Take 1 tablet (4 mg total) by mouth every 6 (six) hours as needed for nausea. Patient not taking: Reported on 10/14/2018 04/06/17   Sinda Du, MD   No results found. - pertinent xrays, CT, MRI studies were reviewed and independently interpreted  Positive ROS: All other systems have been reviewed and were otherwise negative with the exception of those mentioned in the HPI and as above.  Physical Exam: General: Alert, no acute distress Psychiatric: Patient is competent for consent with normal mood and affect Lymphatic: No axillary or cervical lymphadenopathy Cardiovascular: No pedal edema Respiratory: No cyanosis, no use of accessory musculature GI: No organomegaly, abdomen is soft and non-tender    Images:  @ENCIMAGES @  Labs:  Lab Results  Component Value Date   HGBA1C 5.6 12/17/2016   HGBA1C 5.5 03/12/2014   HGBA1C 6.0 (H) 06/21/2012   REPTSTATUS PENDING 12/25/2018   GRAMSTAIN  06/27/2017    ABUNDANT WBC PRESENT,BOTH PMN AND MONONUCLEAR RARE GRAM NEGATIVE RODS RARE GRAM NEGATIVE COCCI IN PAIRS    CULT  12/25/2018    NO GROWTH < 24 HOURS Performed at [facility], [address] [place], [place] [address]     Lab Results  Component Value Date   ALBUMIN 2.2 (L) 12/25/2018   ALBUMIN 3.4 (L) 09/11/2018   ALBUMIN 3.3 (L) 05/10/2018    Neurologic: Patient does not have protective  sensation bilateral lower extremities.   MUSCULOSKELETAL:   Skin: Examination of both legs she has foul-smelling draining necrotic ulcers involving almost the entire leg laterally from the knee to the ankle worse on the right than the left.  Her legs are painful.  Patient has a palpable dorsalis pedis pulse bilaterally no arterial insufficiency.  Her albumin is 2.2 with severe protein caloric malnutrition.  Assessment: Assessment: Severe protein caloric malnutrition with morbid obesity with large necrotic venous stasis ulcers both lower extremities with foul-smelling drainage and necrotic tissue.  Plan: Plan: Discussed with the patient we can proceed with limb salvage intervention with surgical intervention starting Tuesday or Wednesday with follow-up surgery on Friday  and subsequent surgeries after that.  Discussed that if the depth of the necrotic tissue is too great we would not be able to proceed with limb salvage intervention.  Risks and benefits were discussed patient states she understands wished to proceed with limb salvage intervention.  I will have her start dressing changes at this time continue IV vancomycin and Zosyn antibiotics to decrease the soft tissue cellulitis.  Thank you for the consult and the opportunity to see Ms. Jacalyn Lefevre, [place] [phone] 5:16 PM

## 2018-12-26 NOTE — ED Notes (Signed)
Bilateral lower legs - ulcerated tissue bandaged by home health today with wet to dry dressing.  Dressings removed for Examination.  Bilateral leg swelling, patient stated she has been on them a lot lately.  Pt c/o pain to legs.

## 2018-12-26 NOTE — H&P (Signed)
History and Physical    CRYSTEN KAMAN SEG:[id number] DOB: [birth year] DOA: 12/25/2018  PCP: Rutherford Guys, MD  Patient coming from: Home  Chief Complaint: Ulcers infected  HPI: TAURIEL SCRONCE is a 58 y.o. female with medical history significant of venous stasis ulcers, morbid obesity, congestive heart failure sent in from her home health wound care nurse secondary to worsening bilateral ulcerations to her legs.  Patient reports that it is been several days she is having foul smell and drainage from the wounds.  She denies any fevers.  She denies any worsening swelling.  She denies any shortness of breath.  To bilateral lower extremities patient has large ulcers that are draining semi-purulent material and is extremely foul-smelling.  Patient is being referred for admission for wound infection.  She does see Dr. early and has been evaluated for possible vascular component of her wounds please see his comments from below recent appointment.  From Dr. Marrion Coy note consultation as an outpatient in October 2019 "I explained that these imaging studies are not particularly reliable to determine adequacy of flow.  She does not have any history or physical findings that would suggest arterial insufficiency.  She does have good hand-held Doppler signal in her posterior tibial and dorsalis pedis bilaterally.  I would not recommend any further imaging studies for evaluation of this.  This does appear to be strictly related to [place] severe venous stasis disease which she has had for years.  She is currently undergoing home health dressing changes with Silvadene and compression.  This had been 3 times a week and is dropped back to twice a week.  Feel that she may benefit from 3 times a week redressing due to the amount of open area and some continued necrotic debris.  Also explained that she could consider being seen at the [facility] wound center if this progresses for ongoing debridement by the physicians at  the wound center.  She was reassured with this discussion will see [place] again on an as-needed basis "   Review of Systems: As per HPI otherwise 10 point review of systems negative.   Past Medical History:  Diagnosis Date  . Asthma   . CHF (congestive heart failure) ([place]) 03/12/2014   a. EF 40-45% by echo in 06/2017 with normal cors by cath. ICD placed following VT arrest  . Depression   . Headache(784.0)   . Hyperlipidemia   . Hypertension   . Lung nodules   . Renal disorder   . Sarcoidosis   . Ulcer    recurring, from chronic venous insufficiency    Past Surgical History:  Procedure Laterality Date  . cataract surgery Left 07-2013  . ICD IMPLANT N/A 07/05/2017   Procedure: ICD Implant;  Surgeon: Constance Haw, MD;  Location: [place] [facility];  Service: Cardiovascular;  Laterality: N/A;  . LEFT HEART CATH AND CORONARY ANGIOGRAPHY N/A 07/03/2017   Procedure: Left Heart Cath and Coronary Angiography;  Surgeon: Belva Crome, MD;  Location: [place] [facility];  Service: Cardiovascular;  Laterality: N/A;     reports that she has never smoked. She has never used smokeless tobacco. She reports that she does not drink alcohol or use drugs.  Allergies  Allergen Reactions  . Tramadol     hallucination    Family History  Problem Relation Age of Onset  . Heart failure Mother   . Diabetes Mellitus II Mother   . Hypertension Mother   . Cancer Mother  unknown type  . Heart disease Father   . Stroke Father   . Diabetes Mellitus II Father     Prior to Admission medications   Medication Sig Start Date End Date Taking? Authorizing Provider  albuterol (PROVENTIL HFA;VENTOLIN HFA) 108 (90 Base) MCG/ACT inhaler Inhale 2 puffs into the lungs every 6 (six) hours as needed for shortness of breath. 07/06/17   Cristal Ford, DO  aspirin EC 81 MG tablet Take 81 mg by mouth daily.    [provider]  carvedilol (COREG) 6.25 MG tablet Take 6.25 mg by mouth 2 (two)  times daily. 09/04/18   [provider]  doxycycline (VIBRA-TABS) 100 MG tablet Take 1 tablet (100 mg total) by mouth 2 (two) times daily. 09/27/18   Rutherford Guys, MD  doxycycline (VIBRAMYCIN) 100 MG capsule Take 100 mg by mouth 2 (two) times daily. 09/29/18   [provider]  febuxostat (ULORIC) 40 MG tablet Take 40 mg by mouth daily. 09/04/18   [provider]  furosemide (LASIX) 40 MG tablet Take 40 mg by mouth daily. 08/22/18   [provider]  HYDROcodone-acetaminophen (NORCO/VICODIN) 5-325 MG tablet Take 1 tablet by mouth every 6 (six) hours as needed for moderate pain. 11/26/18   Rutherford Guys, MD  loratadine (CLARITIN) 10 MG tablet Take 10 mg by mouth daily.    [provider]  ondansetron (ZOFRAN) 4 MG tablet Take 1 tablet (4 mg total) by mouth every 6 (six) hours as needed for nausea. Patient not taking: Reported on 10/14/2018 04/06/17   Sinda Du, MD  SANTYL ointment APPLY TO WOUND AS DIRECTED PER BLE TREATMENT. 07/09/18   Rutherford Guys, MD  SPS 15 GM/60ML suspension TAKE AS DIRECTED AS A 1 TIME DOSE 07/17/18   [provider]  Vitamin D, Ergocalciferol, (DRISDOL) 50000 units CAPS capsule TAKE ONE CAPSULE BY MOUTH EVERY WEEK FOR EIGHT WEEKS AND THEN ONE CAPSULE EVERY MONTH THEREAFTER 08/19/18   [provider]    Physical Exam: Vitals:   12/26/18 0415 12/26/18 0500 12/26/18 0600 12/26/18 0649  BP: 109/62 106/73 118/62 (!) 107/59  Pulse: 76 80 84 81  Resp:    17  Temp:    98.5 F (36.9 C)  TempSrc:    Oral  SpO2: 100% 100% 100% 100%  Weight:      Height:          Constitutional: NAD, calm, comfortable Vitals:   12/26/18 0415 12/26/18 0500 12/26/18 0600 12/26/18 0649  BP: 109/62 106/73 118/62 (!) 107/59  Pulse: 76 80 84 81  Resp:    17  Temp:    98.5 F (36.9 C)  TempSrc:    Oral  SpO2: 100% 100% 100% 100%  Weight:      Height:       Eyes: PERRL, lids and conjunctivae normal ENMT: Mucous  membranes are moist. Posterior pharynx clear of any exudate or lesions.Normal dentition.  Neck: normal, supple, no masses, no thyromegaly Respiratory: clear to auscultation bilaterally, no wheezing, no crackles. Normal respiratory effort. No accessory muscle use.  Cardiovascular: Regular rate and rhythm, no murmurs / rubs / gallops. No extremity edema. 2+ pedal pulses. No carotid bruits.  Abdomen: no tenderness, no masses palpated. No hepatosplenomegaly. Bowel sounds positive.  Musculoskeletal: no clubbing / cyanosis. No joint deformity upper and lower extremities. Good ROM, no contractures. Normal muscle tone.  Skin: Bilateral large wound ulcerations to lower extremity with semi-purulent discharge and extremely foul-smelling with no significant surrounding erythema  neurologic: CN 2-12 grossly intact. Sensation intact, DTR normal. Strength 5/5 in all 4.  Psychiatric: Normal judgment and insight. Alert and oriented x 3. Normal mood.    Labs on Admission: I have personally reviewed following labs and imaging studies  CBC: Recent Labs  Lab 12/25/18 1521  WBC 20.0*  NEUTROABS 16.8*  HGB 9.0*  HCT 30.9*  MCV 87.8  PLT 983*   Basic Metabolic Panel: Recent Labs  Lab 12/25/18 1521  NA 137  K 3.3*  CL 102  CO2 24  GLUCOSE 124*  BUN 18  CREATININE 1.53*  CALCIUM 8.9   GFR: Estimated Creatinine Clearance: 53.9 mL/min (A) (by C-G formula based on SCr of 1.53 mg/dL (H)). Liver Function Tests: Recent Labs  Lab 12/25/18 1521  AST 11*  ALT 7  ALKPHOS 65  BILITOT 0.6  PROT 8.9*  ALBUMIN 2.2*   No results for input(s): LIPASE, AMYLASE in the last 168 hours. No results for input(s): AMMONIA in the last 168 hours. Coagulation Profile: No results for input(s): INR, PROTIME in the last 168 hours. Cardiac Enzymes: No results for input(s): CKTOTAL, CKMB, CKMBINDEX, TROPONINI in the last 168 hours. BNP (last 3 results) No results for input(s): PROBNP in the last 8760 hours. HbA1C: No  results for input(s): HGBA1C in the last 72 hours. CBG: No results for input(s): GLUCAP in the last 168 hours. Lipid Profile: No results for input(s): CHOL, HDL, LDLCALC, TRIG, CHOLHDL, LDLDIRECT in the last 72 hours. Thyroid Function Tests: No results for input(s): TSH, T4TOTAL, FREET4, T3FREE, THYROIDAB in the last 72 hours. Anemia Panel: No results for input(s): VITAMINB12, FOLATE, FERRITIN, TIBC, IRON, RETICCTPCT in the last 72 hours. Urine analysis:    Component Value Date/Time   COLORURINE YELLOW 02/20/2018 2357   APPEARANCEUR HAZY (A) 02/20/2018 2357   LABSPEC 1.015 02/20/2018 2357   PHURINE 5.0 02/20/2018 2357   GLUCOSEU NEGATIVE 02/20/2018 2357   HGBUR SMALL (A) 02/20/2018 2357   BILIRUBINUR NEGATIVE 02/20/2018 2357   KETONESUR NEGATIVE 02/20/2018 2357   PROTEINUR NEGATIVE 02/20/2018 2357   UROBILINOGEN 0.2 04/10/2014 0949   NITRITE NEGATIVE 02/20/2018 2357   LEUKOCYTESUR LARGE (A) 02/20/2018 2357   Sepsis Labs: !!!!!!!!!!!!!!!!!!!!!!!!!!!!!!!!!!!!!!!!!!!! @LABRCNTIP (procalcitonin:4,lacticidven:4) ) Recent Results (from the past 240 hour(s))  Culture, blood (Routine x 2)     Status: None (Preliminary result)   Collection Time: 12/25/18  3:45 PM  Result Value Ref Range Status   Specimen Description BLOOD RIGHT HAND  Final   Special Requests   Final    BOTTLES DRAWN AEROBIC ONLY Blood Culture adequate volume   Culture   Final    NO GROWTH < 24 HOURS Performed at [facility], [place] [address] [place], [place] [address]    Report Status PENDING  Incomplete  Culture, blood (Routine x 2)     Status: None (Preliminary result)   Collection Time: 12/25/18  3:51 PM  Result Value Ref Range Status   Specimen Description BLOOD RIGHT ANTECUBITAL  Final   Special Requests   Final    BOTTLES DRAWN AEROBIC AND ANAEROBIC Blood Culture adequate volume   Culture   Final    NO GROWTH < 24 HOURS Performed at [facility], [place] [address], [place], [place] [address]     Report Status PENDING  Incomplete     Radiological Exams on Admission: No results found.  Old chart reviewed   Assessment/Plan 58 year old female with chronic lower extremity venous stasis wounds that appear now infected Principal Problem:  Cellulitis to wounds with wound infection-placed on IV vancomycin and Zosyn for now.  Obtain wound care consultation.  Await their evaluation wounds may need some debridement.  Patient not septic vital stable.  Active Problems:   Venous stasis ulcers (HCC)-wound care   Hypertension-resume home meds   Morbid obesity (HCC)-noted   CHF (congestive heart failure) (HCC)-currently compensated at this time   Sarcoidosis (HCC)-stable   Med requisition is pending pharmacy completion  DVT prophylaxis: Lovenox Code Status: Full Family Communication: Daughter Disposition Plan: Days Consults called: Wound care Admission status: Admission   Demarquis Osley A MD [facility]  If 7PM-7AM, please contact night-coverage [email or web address] Password [email or web address]  12/26/2018, 8:30 AM

## 2018-12-27 LAB — BASIC METABOLIC PANEL
Anion gap: 7 (ref 5–15)
BUN: 15 mg/dL (ref 6–20)
CO2: 27 mmol/L (ref 22–32)
Calcium: 8.1 mg/dL — ABNORMAL LOW (ref 8.9–10.3)
Chloride: 103 mmol/L (ref 98–111)
Creatinine, Ser: 1.26 mg/dL — ABNORMAL HIGH (ref 0.44–1.00)
GFR calc Af Amer: 55 mL/min — ABNORMAL LOW (ref >60)
GFR calc non Af Amer: 48 mL/min — ABNORMAL LOW (ref >60)
Glucose, Bld: 113 mg/dL — ABNORMAL HIGH (ref 70–99)
Potassium: 2.9 mmol/L — ABNORMAL LOW (ref 3.5–5.1)
Sodium: 137 mmol/L (ref 135–145)

## 2018-12-27 LAB — CBC
HCT: 23 % — ABNORMAL LOW (ref 36.0–46.0)
Hemoglobin: 7 g/dL — ABNORMAL LOW (ref 12.0–15.0)
MCH: 26.7 pg (ref 26.0–34.0)
MCHC: 30.4 g/dL (ref 30.0–36.0)
MCV: 87.8 fL (ref 80.0–100.0)
Platelets: 352 10*3/uL (ref 150–400)
RBC: 2.62 MIL/uL — ABNORMAL LOW (ref 3.87–5.11)
RDW: 15.7 % — ABNORMAL HIGH (ref 11.5–15.5)
WBC: 12.5 10*3/uL — ABNORMAL HIGH (ref 4.0–10.5)
nRBC: 0 % (ref 0.0–0.2)

## 2018-12-27 LAB — IRON AND TIBC
Iron: 34 ug/dL (ref 28–170)
Saturation Ratios: 25 % (ref 10.4–31.8)
TIBC: 139 ug/dL — ABNORMAL LOW (ref 250–450)
UIBC: 105 ug/dL

## 2018-12-27 LAB — MAGNESIUM: Magnesium: 2 mg/dL (ref 1.7–2.4)

## 2018-12-27 LAB — FERRITIN: Ferritin: 216 ng/mL (ref 11–307)

## 2018-12-27 MED ORDER — POTASSIUM CHLORIDE 10 MEQ/100ML IV SOLN
10.0000 meq | INTRAVENOUS | Status: AC
  Administered 2018-12-27 – 2018-12-28 (×4): 10 meq via INTRAVENOUS
  Filled 2018-12-27 (×4): qty 100

## 2018-12-27 MED ORDER — VANCOMYCIN HCL 10 G IV SOLR
1250.0000 mg | INTRAVENOUS | Status: DC
  Administered 2018-12-28 – 2018-12-31 (×4): 1250 mg via INTRAVENOUS
  Filled 2018-12-27 (×5): qty 1250

## 2018-12-27 MED ORDER — POTASSIUM CHLORIDE 10 MEQ/100ML IV SOLN
10.0000 meq | INTRAVENOUS | Status: AC
  Administered 2018-12-27: 10 meq via INTRAVENOUS
  Filled 2018-12-27: qty 100

## 2018-12-27 MED ORDER — POTASSIUM CHLORIDE CRYS ER 20 MEQ PO TBCR
40.0000 meq | EXTENDED_RELEASE_TABLET | Freq: Once | ORAL | Status: AC
  Administered 2018-12-27: 40 meq via ORAL
  Filled 2018-12-27: qty 2

## 2018-12-27 MED ORDER — FUROSEMIDE 40 MG PO TABS
60.0000 mg | ORAL_TABLET | Freq: Every day | ORAL | Status: DC
  Administered 2018-12-28 – 2019-01-04 (×8): 60 mg via ORAL
  Filled 2018-12-27 (×8): qty 1

## 2018-12-27 NOTE — Progress Notes (Signed)
Pt. BP 89/54 and pt. was asymptomatic. Paged MD and held BP medicine. No new orders given.

## 2018-12-27 NOTE — Progress Notes (Signed)
Pharmacy Antibiotic Note  Kari [name] is a 58 y.o. female admitted on 12/25/2018 with wound infection.  Pharmacy has been consulted for Vancomycin and Zosyn dosing.  Plan: Increase vancomycin to 1,250mg  IV Q24h Continue Zosyn 3.375 gm IV q8h (4 hour infusion) Monitor clinical picture, renal function,vanc levels prn F/U C&S, abx deescalation / LOT  Height: 5\' 7"  (170.2 cm) Weight: 255 lb (115.7 kg) IBW/kg (Calculated) : 61.6  Temp (24hrs), Avg:98.9 F (37.2 C), Min:98.7 F (37.1 C), Max:99 F (37.2 C)  Recent Labs  Lab 12/25/18 1521 12/25/18 1606 12/27/18 0243  WBC 20.0*  --  12.5*  CREATININE 1.53*  --  1.26*  LATICACIDVEN  --  1.71  --     Estimated Creatinine Clearance: 65.5 mL/min (A) (by C-G formula based on SCr of 1.26 mg/dL (H)).    Allergies  Allergen Reactions  . Tramadol Other (See Comments)    hallucination    [name] 12/27/2018 10:31 AM

## 2018-12-27 NOTE — Progress Notes (Signed)
Text paged and paged Dr Tawanna Solo about pt not tolerating IV potassium burning.  Multiple pages and texts to ask for guidance due to K being 2.9.

## 2018-12-27 NOTE — Progress Notes (Signed)
PROGRESS NOTE    Kari [name]  INO:[id number] DOB: 08/14/1961 DOA: 12/25/2018 PCP: Rutherford Guys, MD   Brief Narrative: Patient  a 58 year old female with past medical history of chronic venous stasis ulcers on bilateral lower extremities, morbid obesity, congestive heart failure, sarcoidosis who was sent from home health wound care nurse secondary to worsening bilateral ulcerations on her legs.  The ulcers were reported to be foul-smelling and there was drainage also.  No report of fever or chills at home.  Patient was admitted for management of the wound infection.  Orthopedics consulted.  Started on broad-spectrum antibiotics.  Dr. Sharol Given planning for staged  Limb salvage surgical interventions.  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  She was following with vascular surgery and wound care as an outpatient. Currently patient is not septic.  Hemodynamically stable.  No fever. improving leukocytosis.  Will follow blood cultures.  Continue broad-spectrum antibiotics.  Currently on vancomycin and Zosyn. Patient seen by orthopedics.  Dr. Sharol Given planning for staged  Limb salvage surgical interventions starting from Tuesday. Currently both lower extremities wrapped with dressings.  Chronic combined systolic and diastolic heart failure: History of nonischemic cardiomyopathy.  Left heart catheterization on 06/2017 showed normal coronary arteries, ejection fraction of 35 to 09%, combined systolic and diastolic heart failure.  She underwent ICD  placement on 06/2017.  On Lasix at home.  Continue Coreg.  CKD stage III: Baseline creatinine around 1.3-1.4.  Currently kidney function on baseline.  Hypokalemia: Supplemented with potassium.  Will check magnesium level also.  Chronic normocytic anemia: Most likely associated  with chronic medical conditions.  Iron studies did not suggest iron deficiency anemia.  Patient denies any change the color of her stool.  Will check FOBT.  If hemoglobin drops further, will transfuse her.  Patient has history of blood transfusion in the past.  Hypertension: Currently blood pressure stable.  Home medications resumed.  Sarcoidosis: Currently stable.          DVT prophylaxis: Lovenox Code Status: Full Family Communication: Family members present at the bedside Disposition Plan: Home after full work-up, OR   Consultants: Orthopedics  Procedures: None  Antimicrobials: Vancomycin, Zosyn  Subjective: Patient seen and examined the bedside this morning.  Remains hemodynamically stable.  Comfortable.  Denies any pain.  Objective: Vitals:   12/26/18 0649 12/26/18 1317 12/26/18 2120 12/27/18 0441  BP: (!) 107/59 (!) 96/57 (!) 89/54 103/67  Pulse: 81 84 82 78  Resp: 17 19 18  (!) 22  Temp: 98.5 F (36.9 C) 99 F (37.2 C) 98.9 F (37.2 C) 98.7 F (37.1 C)  TempSrc: Oral Oral Oral Oral  SpO2: 100% 99% 98% 98%  Weight:      Height:        Intake/Output Summary (Last 24 hours) at 12/27/2018 1319 Last data filed at 12/27/2018 1015 Gross per 24 hour  Intake 460 ml  Output 1200 ml  Net -740 ml   Filed Weights   12/25/18 1517  Weight: 115.7 kg    Examination:  General exam: Appears calm and comfortable ,Not in distress,obese  HEENT:PERRL,Oral mucosa moist, Ear/Nose normal on gross exam Respiratory system: Bilateral equal air entry, normal vesicular breath sounds, no wheezes or crackles  Cardiovascular system: S1 & S2 heard, RRR. No JVD, murmurs, rubs, gallops or clicks. No pedal  edema. Gastrointestinal system: Abdomen is nondistended, soft and nontender. No organomegaly or masses felt. Normal bowel sounds heard. Central nervous system: Alert and oriented. No focal neurological deficits. Extremities:Trace Edema  bilateral lower extremities, bilateral lower extremities wrapped with dressing. Skin: Venous stasis changes, ulcers on bilateral lower extremities  MSK: Normal muscle bulk,tone ,power Psychiatry: Judgement and insight appear normal. Mood & affect appropriate.     Data Reviewed: I have personally reviewed following labs and imaging studies  CBC: Recent Labs  Lab 12/25/18 1521 12/27/18 0243  WBC 20.0* 12.5*  NEUTROABS 16.8*  --   HGB 9.0* 7.0*  HCT 30.9* 23.0*  MCV 87.8 87.8  PLT 418* 161   Basic Metabolic Panel: Recent Labs  Lab 12/25/18 1521 12/27/18 0243  NA 137 137  K 3.3* 2.9*  CL 102 103  CO2 24 27  GLUCOSE 124* 113*  BUN 18 15  CREATININE 1.53* 1.26*  CALCIUM 8.9 8.1*   GFR: Estimated Creatinine Clearance: 65.5 mL/min (A) (by C-G formula based on SCr of 1.26 mg/dL (H)). Liver Function Tests: Recent Labs  Lab 12/25/18 1521  AST 11*  ALT 7  ALKPHOS 65  BILITOT 0.6  PROT 8.9*  ALBUMIN 2.2*   No results for input(s): LIPASE, AMYLASE in the last 168 hours. No results for input(s): AMMONIA in the last 168 hours. Coagulation Profile: No results for input(s): INR, PROTIME in the last 168 hours. Cardiac Enzymes: No results for input(s): CKTOTAL, CKMB, CKMBINDEX, TROPONINI in the last 168 hours. BNP (last 3 results) No results for input(s): PROBNP in the last 8760 hours. HbA1C: No results for input(s): HGBA1C in the last 72 hours. CBG: No results for input(s): GLUCAP in the last 168 hours. Lipid Profile: No results for input(s): CHOL, HDL, LDLCALC, TRIG, CHOLHDL, LDLDIRECT in the last 72 hours. Thyroid Function Tests: No results for input(s): TSH, T4TOTAL, FREET4, T3FREE, THYROIDAB in the last 72 hours. Anemia  Panel: Recent Labs    12/27/18 0943  FERRITIN 216  TIBC 139*  IRON 34   Sepsis Labs: Recent Labs  Lab 12/25/18 1606  LATICACIDVEN 1.71    Recent Results (from the past 240 hour(s))  Culture, blood (Routine x 2)     Status: None (Preliminary result)   Collection Time: 12/25/18  3:45 PM  Result Value Ref Range Status   Specimen Description BLOOD RIGHT HAND  Final   Special Requests   Final    BOTTLES DRAWN AEROBIC ONLY Blood Culture adequate volume Performed at [facility] [address], [place], [place] [address]    Culture NO GROWTH 2 DAYS  Final   Report Status PENDING  Incomplete  Culture, blood (Routine x 2)     Status: None (Preliminary result)   Collection Time: 12/25/18  3:51 PM  Result Value Ref Range Status   Specimen Description BLOOD RIGHT ANTECUBITAL  Final   Special Requests   Final    BOTTLES DRAWN AEROBIC AND ANAEROBIC Blood Culture adequate volume Performed at [facility], [place] [address], [place], [place] [address]    Culture NO GROWTH 2 DAYS  Final   Report Status PENDING  Incomplete  MRSA PCR Screening     Status: None   Collection Time: 12/26/18  7:01 AM  Result Value Ref Range Status   MRSA by PCR NEGATIVE NEGATIVE Final    Comment:        The GeneXpert MRSA Assay (FDA approved for NASAL specimens only), is one component of a comprehensive MRSA colonization surveillance program. It is not intended to diagnose MRSA infection nor to guide or monitor treatment for MRSA infections. Performed at [facility], [place] [address] [place], [place] [address]          Radiology Studies: No results found.      Scheduled Meds: . carvedilol  6.25 mg Oral BID  . docusate sodium  100 mg Oral Daily  . enoxaparin (LOVENOX) injection  40 mg Subcutaneous Q24H  . febuxostat  40 mg Oral Daily  . sodium chloride flush  3 mL Intravenous Q12H   Continuous Infusions: . sodium chloride    . piperacillin-tazobactam (ZOSYN)  IV 3.375  g (12/27/18 1020)  . [START ON 12/28/2018] vancomycin       LOS: 1 day    Time spent: More than 50% of that time was spent in counseling and/or coordination of care.      Shelly Coss, MD [facility] Pager [phone]  If 7PM-7AM, please contact night-coverage [email or web address] Password [email or web address] 12/27/2018, 1:19 PM

## 2018-12-27 NOTE — Plan of Care (Signed)
  Problem: Education: Goal: Knowledge of General Education information will improve Description Including pain rating scale, medication(s)/side effects and non-pharmacologic comfort measures Outcome: Progressing   Problem: Clinical Measurements: Goal: Ability to maintain clinical measurements within normal limits will improve Outcome: Progressing   Problem: Nutrition: Goal: Adequate nutrition will be maintained Outcome: Progressing   Problem: Coping: Goal: Level of anxiety will decrease Outcome: Progressing   Problem: Pain Managment: Goal: General experience of comfort will improve Outcome: Progressing   Problem: Safety: Goal: Ability to remain free from injury will improve Outcome: Progressing

## 2018-12-28 LAB — CBC WITH DIFFERENTIAL/PLATELET
Abs Immature Granulocytes: 0.07 10*3/uL (ref 0.00–0.07)
Basophils Absolute: 0.1 10*3/uL (ref 0.0–0.1)
Basophils Relative: 1 %
Eosinophils Absolute: 0.3 10*3/uL (ref 0.0–0.5)
Eosinophils Relative: 4 %
HCT: 25.3 % — ABNORMAL LOW (ref 36.0–46.0)
Hemoglobin: 7.3 g/dL — ABNORMAL LOW (ref 12.0–15.0)
Immature Granulocytes: 1 %
Lymphocytes Relative: 20 %
Lymphs Abs: 1.7 10*3/uL (ref 0.7–4.0)
MCH: 25.3 pg — ABNORMAL LOW (ref 26.0–34.0)
MCHC: 28.9 g/dL — ABNORMAL LOW (ref 30.0–36.0)
MCV: 87.8 fL (ref 80.0–100.0)
Monocytes Absolute: 0.8 10*3/uL (ref 0.1–1.0)
Monocytes Relative: 10 %
Neutro Abs: 5.4 10*3/uL (ref 1.7–7.7)
Neutrophils Relative %: 64 %
Platelets: 331 10*3/uL (ref 150–400)
RBC: 2.88 MIL/uL — ABNORMAL LOW (ref 3.87–5.11)
RDW: 15.7 % — ABNORMAL HIGH (ref 11.5–15.5)
WBC: 8.4 10*3/uL (ref 4.0–10.5)
nRBC: 0.2 % (ref 0.0–0.2)

## 2018-12-28 LAB — OCCULT BLOOD X 1 CARD TO LAB, STOOL: Fecal Occult Bld: NEGATIVE

## 2018-12-28 LAB — BASIC METABOLIC PANEL
Anion gap: 7 (ref 5–15)
BUN: 13 mg/dL (ref 6–20)
CO2: 27 mmol/L (ref 22–32)
Calcium: 8.4 mg/dL — ABNORMAL LOW (ref 8.9–10.3)
Chloride: 105 mmol/L (ref 98–111)
Creatinine, Ser: 1.21 mg/dL — ABNORMAL HIGH (ref 0.44–1.00)
GFR calc Af Amer: 58 mL/min — ABNORMAL LOW (ref >60)
GFR calc non Af Amer: 50 mL/min — ABNORMAL LOW (ref >60)
Glucose, Bld: 124 mg/dL — ABNORMAL HIGH (ref 70–99)
Potassium: 3.5 mmol/L (ref 3.5–5.1)
Sodium: 139 mmol/L (ref 135–145)

## 2018-12-28 MED ORDER — POTASSIUM CHLORIDE CRYS ER 20 MEQ PO TBCR
40.0000 meq | EXTENDED_RELEASE_TABLET | Freq: Once | ORAL | Status: AC
  Administered 2018-12-28: 40 meq via ORAL
  Filled 2018-12-28: qty 2

## 2018-12-28 NOTE — Progress Notes (Signed)
PROGRESS NOTE    Kari [name]  QMV:[id number] DOB: 06/26/1961 DOA: 12/25/2018 PCP: Rutherford Guys, MD   Brief Narrative: Patient  a 58 year old female with past medical history of chronic venous stasis ulcers on bilateral lower extremities, morbid obesity, congestive heart failure, sarcoidosis who was sent from home health wound care nurse secondary to worsening bilateral ulcerations on her legs.  The ulcers were reported to be foul-smelling and there was drainage also.  No report of fever or chills at home.  Patient was admitted for management of the wound infection.  Orthopedics consulted.  Started on broad-spectrum antibiotics.  Dr. Sharol Given planning for staged  Limb salvage surgical interventions.  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  She was following with vascular surgery and wound care as an outpatient. Currently patient is not septic.  Hemodynamically stable.  No fever. improving leukocytosis.  Will follow blood cultures.  Continue broad-spectrum antibiotics.  Currently on vancomycin and Zosyn. Patient seen by orthopedics.  Dr. Sharol Given planning for staged  Limb salvage surgical interventions starting from Tuesday. Currently both lower extremities wrapped with dressings.Wound care following.  Chronic combined systolic and diastolic heart failure: History of nonischemic cardiomyopathy.  Left heart catheterization on 06/2017 showed normal coronary arteries, ejection fraction of 35 to 28%, combined systolic and diastolic heart failure.  She  underwent ICD placement on 06/2017.  On Lasix at home.  Continue Coreg.  CKD stage III: Baseline creatinine around 1.3-1.4.  Currently kidney function on baseline.  Hypokalemia: Supplemented with potassium.    Chronic normocytic anemia: Most likely associated  with chronic medical conditions.  Iron studies did not suggest iron deficiency anemia.  Patient denies any change the color of her stool.  Will check FOBT.  If hemoglobin drops further, will transfuse her.  Patient has history of blood transfusion in the past.  Hypertension: Currently blood pressure stable.  Home medications resumed.  Sarcoidosis: Currently stable.          DVT prophylaxis: Lovenox Code Status: Full Family Communication: Daughter present at the bedside Disposition Plan: Home after full work-up, OR   Consultants: Orthopedics  Procedures: None  Antimicrobials: Vancomycin, Zosyn  Subjective: Patient seen and examined the bedside this morning.  Remains hemodynamically stable.  Comfortable.  Denies any pain.  Objective: Vitals:   12/27/18 0441 12/27/18 1418 12/27/18 2209 12/28/18 0523  BP: 103/67 101/69 106/66 96/62  Pulse: 78 71 74 74  Resp: (!) 22 17 16 20   Temp: 98.7 F (37.1 C) 98.6 F (37 C) 98.4 F (36.9 C) 98.9 F (37.2 C)  TempSrc: Oral Oral Oral Oral  SpO2: 98% 100% 100% 98%  Weight:      Height:        Intake/Output Summary (Last 24 hours) at 12/28/2018 1129 Last data filed at 12/28/2018 0915 Gross per 24 hour  Intake 680 ml  Output 1500 ml  Net -820 ml   Filed Weights   12/25/18 1517  Weight: 115.7 kg    Examination:  General exam: Appears calm and comfortable ,Not in distress,obese HEENT:PERRL,Oral mucosa moist, Ear/Nose normal on  gross exam Respiratory system: Bilateral equal air entry, normal vesicular breath sounds, no wheezes or crackles  Cardiovascular system: S1 & S2 heard, RRR. No JVD, murmurs, rubs, gallops or clicks. No pedal edema. Gastrointestinal system: Abdomen  is nondistended, soft and nontender. No organomegaly or masses felt. Normal bowel sounds heard. Central nervous system: Alert and oriented. No focal neurological deficits. Extremities:Edema on  bilateral lower extremities, bilateral lower extremities wrapped with dressing.Chronic foul smelling ulcers with drainage Skin: Venous stasis changes, ulcers on bilateral lower extremities  MSK: Normal muscle bulk,tone ,power Psychiatry: Judgement and insight appear normal. Mood & affect appropriate.     Data Reviewed: I have personally reviewed following labs and imaging studies  CBC: Recent Labs  Lab 12/25/18 1521 12/27/18 0243 12/28/18 0422  WBC 20.0* 12.5* 8.4  NEUTROABS 16.8*  --  5.4  HGB 9.0* 7.0* 7.3*  HCT 30.9* 23.0* 25.3*  MCV 87.8 87.8 87.8  PLT 418* 352 353   Basic Metabolic Panel: Recent Labs  Lab 12/25/18 1521 12/27/18 0243 12/28/18 0422  NA 137 137 139  K 3.3* 2.9* 3.5  CL 102 103 105  CO2 24 27 27   GLUCOSE 124* 113* 124*  BUN 18 15 13   CREATININE 1.53* 1.26* 1.21*  CALCIUM 8.9 8.1* 8.4*  MG  --  2.0  --    GFR: Estimated Creatinine Clearance: 68.2 mL/min (A) (by C-G formula based on SCr of 1.21 mg/dL (H)). Liver Function Tests: Recent Labs  Lab 12/25/18 1521  AST 11*  ALT 7  ALKPHOS 65  BILITOT 0.6  PROT 8.9*  ALBUMIN 2.2*   No results for input(s): LIPASE, AMYLASE in the last 168 hours. No results for input(s): AMMONIA in the last 168 hours. Coagulation Profile: No results for input(s): INR, PROTIME in the last 168 hours. Cardiac Enzymes: No results for input(s): CKTOTAL, CKMB, CKMBINDEX, TROPONINI in the last 168 hours. BNP (last 3 results) No results for input(s): PROBNP in the last 8760 hours. HbA1C: No results for input(s): HGBA1C in the last 72 hours. CBG: No results for input(s): GLUCAP in the last 168 hours. Lipid Profile: No results for input(s): CHOL, HDL, LDLCALC, TRIG, CHOLHDL, LDLDIRECT in the last 72 hours. Thyroid Function  Tests: No results for input(s): TSH, T4TOTAL, FREET4, T3FREE, THYROIDAB in the last 72 hours. Anemia Panel: Recent Labs    12/27/18 0943  FERRITIN 216  TIBC 139*  IRON 34   Sepsis Labs: Recent Labs  Lab 12/25/18 1606  LATICACIDVEN 1.71    Recent Results (from the past 240 hour(s))  Culture, blood (Routine x 2)     Status: None (Preliminary result)   Collection Time: 12/25/18  3:45 PM  Result Value Ref Range Status   Specimen Description BLOOD RIGHT HAND  Final   Special Requests   Final    BOTTLES DRAWN AEROBIC ONLY Blood Culture adequate volume Performed at [facility] [address], [place], [place] [address]    Culture NO GROWTH 3 DAYS  Final   Report Status PENDING  Incomplete  Culture, blood (Routine x 2)     Status: None (Preliminary result)   Collection Time: 12/25/18  3:51 PM  Result Value Ref Range Status   Specimen Description BLOOD RIGHT ANTECUBITAL  Final   Special Requests   Final    BOTTLES DRAWN AEROBIC AND ANAEROBIC Blood Culture adequate volume Performed at [facility], [place] [address], [place], [place] [address]    Culture NO GROWTH 3 DAYS  Final  Report Status PENDING  Incomplete  MRSA PCR Screening     Status: None   Collection Time: 12/26/18  7:01 AM  Result Value Ref Range Status   MRSA by PCR NEGATIVE NEGATIVE Final    Comment:        The GeneXpert MRSA Assay (FDA approved for NASAL specimens only), is one component of a comprehensive MRSA colonization surveillance program. It is not intended to diagnose MRSA infection nor to guide or monitor treatment for MRSA infections. Performed at [facility], [place] [address], [place], [place] [address]          Radiology Studies: No results found.      Scheduled Meds: . carvedilol  6.25 mg Oral BID  . docusate sodium  100 mg Oral Daily  . enoxaparin (LOVENOX) injection  40 mg Subcutaneous Q24H  . febuxostat  40 mg Oral Daily  . furosemide  60 mg Oral Daily  .  potassium chloride  40 mEq Oral Once  . sodium chloride flush  3 mL Intravenous Q12H   Continuous Infusions: . sodium chloride    . piperacillin-tazobactam (ZOSYN)  IV 3.375 g (12/28/18 0946)  . vancomycin 1,250 mg (12/28/18 0655)     LOS: 2 days    Time spent:25 mins. More than 50% of that time was spent in counseling and/or coordination of care.      Shelly Coss, MD [facility] Pager [phone]  If 7PM-7AM, please contact night-coverage [email or web address] Password [facility] 12/28/2018, 11:29 AM

## 2018-12-29 LAB — BLOOD CULTURE ID PANEL (REFLEXED)

## 2018-12-29 NOTE — Progress Notes (Signed)
PROGRESS NOTE    Kari [name]  GEX:[id number] DOB: 05-Jun-1961 DOA: 12/25/2018 PCP: Rutherford Guys, MD   Brief Narrative: Patient  a 58 year old female with past medical history of chronic venous stasis ulcers on bilateral lower extremities, morbid obesity, congestive heart failure, sarcoidosis who was sent from home health wound care nurse secondary to worsening bilateral ulcerations on her legs.  The ulcers were reported to be foul-smelling and there was drainage also.  No report of fever or chills at home.  Patient was admitted for management of the wound infection.  Orthopedics consulted.  Started on broad-spectrum antibiotics.  Dr. Sharol Given planning for staged  Limb salvage surgical interventions.  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  She was following with vascular surgery and wound care as an outpatient. Currently patient is not septic.  Hemodynamically stable.  No fever. improving leukocytosis.  Will follow blood cultures. NGTD. Continue broad-spectrum antibiotics.  Currently on vancomycin and Zosyn. Patient seen by orthopedics.  Dr. Sharol Given planning for staged  Limb salvage surgical interventions starting from Tuesday. Currently both lower extremities wrapped with dressings.Wound care following.  Chronic combined systolic and diastolic heart failure: History of nonischemic cardiomyopathy.  Left heart catheterization on 06/2017 showed normal coronary arteries, ejection fraction of 35 to 01%, combined systolic and diastolic heart failure.   She underwent ICD placement on 06/2017.  On Lasix at home.  Continue Coreg.  CKD stage III: Baseline creatinine around 1.3-1.4.  Currently kidney function on baseline.  Hypokalemia: Supplemented with potassium.    Chronic normocytic anemia: Most likely associated  with chronic medical conditions.  Iron studies did not suggest iron deficiency anemia.  Patient denies any change the color of her stool. negative FOBT.  If hemoglobin drops further, will transfuse her.  Patient has history of blood transfusion in the past.  Hypertension: Currently blood pressure stable.  Home medications resumed.  Sarcoidosis: Currently stable.          DVT prophylaxis: Lovenox Code Status: Full Family Communication: Daughter present at the bedside Disposition Plan: Home after full work-up, OR   Consultants: Orthopedics  Procedures: None  Antimicrobials: Vancomycin, Zosyn  Subjective: Patient seen and examined the bedside this morning.  Remains hemodynamically stable.  Complains of pain on bilateral lower extremities No other active issues.  Objective: Vitals:   12/28/18 0523 12/28/18 1500 12/28/18 2052 12/29/18 0541  BP: 96/62 102/63 97/60 (!) 108/58  Pulse: 74 73 69 78  Resp: 20 18 19 19   Temp: 98.9 F (37.2 C) 98.7 F (37.1 C) 98.1 F (36.7 C) 98.8 F (37.1 C)  TempSrc: Oral Oral Oral Oral  SpO2: 98% 98% 99% 96%  Weight:      Height:        Intake/Output Summary (Last 24 hours) at 12/29/2018 1220 Last data filed at 12/29/2018 1143 Gross per 24 hour  Intake 780 ml  Output 2400 ml  Net -1620 ml   Filed Weights   12/25/18 1517  Weight: 115.7 kg    Examination:  General exam: Appears calm and comfortable ,Not in distress,obese HEENT:PERRL,Oral mucosa  moist, Ear/Nose normal on gross exam Respiratory system: Bilateral equal air entry, normal vesicular breath sounds, no wheezes or crackles  Cardiovascular system: S1 & S2 heard, RRR. No JVD, murmurs, rubs, gallops or clicks. Pedal  edema. Gastrointestinal system: Abdomen is nondistended, soft and nontender. No organomegaly or masses felt. Normal bowel sounds heard. Central nervous system: Alert and oriented. No focal neurological deficits. Extremities:Edema on  bilateral lower extremities, bilateral lower extremities wrapped with dressing.Chronic foul smelling ulcers with drainage Skin: Venous stasis changes, ulcers on bilateral lower extremities  MSK: Normal muscle bulk,tone ,power Psychiatry: Judgement and insight appear normal. Mood & affect appropriate.     Data Reviewed: I have personally reviewed following labs and imaging studies  CBC: Recent Labs  Lab 12/25/18 1521 12/27/18 0243 12/28/18 0422  WBC 20.0* 12.5* 8.4  NEUTROABS 16.8*  --  5.4  HGB 9.0* 7.0* 7.3*  HCT 30.9* 23.0* 25.3*  MCV 87.8 87.8 87.8  PLT 418* 352 419   Basic Metabolic Panel: Recent Labs  Lab 12/25/18 1521 12/27/18 0243 12/28/18 0422  NA 137 137 139  K 3.3* 2.9* 3.5  CL 102 103 105  CO2 24 27 27   GLUCOSE 124* 113* 124*  BUN 18 15 13   CREATININE 1.53* 1.26* 1.21*  CALCIUM 8.9 8.1* 8.4*  MG  --  2.0  --    GFR: Estimated Creatinine Clearance: 68.2 mL/min (A) (by C-G formula based on SCr of 1.21 mg/dL (H)). Liver Function Tests: Recent Labs  Lab 12/25/18 1521  AST 11*  ALT 7  ALKPHOS 65  BILITOT 0.6  PROT 8.9*  ALBUMIN 2.2*   No results for input(s): LIPASE, AMYLASE in the last 168 hours. No results for input(s): AMMONIA in the last 168 hours. Coagulation Profile: No results for input(s): INR, PROTIME in the last 168 hours. Cardiac Enzymes: No results for input(s): CKTOTAL, CKMB, CKMBINDEX, TROPONINI in the last 168 hours. BNP (last 3 results) No results for input(s): PROBNP in the last 8760 hours. HbA1C: No results for input(s): HGBA1C in the last 72 hours. CBG: No results for input(s): GLUCAP in the last 168 hours. Lipid Profile: No results for input(s): CHOL, HDL, LDLCALC, TRIG, CHOLHDL, LDLDIRECT in  the last 72 hours. Thyroid Function Tests: No results for input(s): TSH, T4TOTAL, FREET4, T3FREE, THYROIDAB in the last 72 hours. Anemia Panel: Recent Labs    12/27/18 0943  FERRITIN 216  TIBC 139*  IRON 34   Sepsis Labs: Recent Labs  Lab 12/25/18 1606  LATICACIDVEN 1.71    Recent Results (from the past 240 hour(s))  Culture, blood (Routine x 2)     Status: None (Preliminary result)   Collection Time: 12/25/18  3:45 PM  Result Value Ref Range Status   Specimen Description BLOOD RIGHT HAND  Final   Special Requests   Final    BOTTLES DRAWN AEROBIC ONLY Blood Culture adequate volume Performed at [facility] [address], [place], [place] [address]    Culture NO GROWTH 3 DAYS  Final   Report Status PENDING  Incomplete  Culture, blood (Routine x 2)     Status: None (Preliminary result)   Collection Time: 12/25/18  3:51 PM  Result Value Ref Range Status   Specimen Description BLOOD RIGHT ANTECUBITAL  Final   Special Requests   Final    BOTTLES DRAWN AEROBIC AND ANAEROBIC Blood Culture adequate volume Performed at [facility], [place] [address], [place], [place] [address]    Culture NO GROWTH 3  DAYS  Final   Report Status PENDING  Incomplete  MRSA PCR Screening     Status: None   Collection Time: 12/26/18  7:01 AM  Result Value Ref Range Status   MRSA by PCR NEGATIVE NEGATIVE Final    Comment:        The GeneXpert MRSA Assay (FDA approved for NASAL specimens only), is one component of a comprehensive MRSA colonization surveillance program. It is not intended to diagnose MRSA infection nor to guide or monitor treatment for MRSA infections. Performed at [facility], [place] [address] [place], [place] [address]          Radiology Studies: No results found.      Scheduled Meds: . carvedilol  6.25 mg Oral BID  . docusate sodium  100 mg Oral Daily  . enoxaparin (LOVENOX) injection  40 mg Subcutaneous Q24H  . febuxostat  40 mg Oral Daily    . furosemide  60 mg Oral Daily  . sodium chloride flush  3 mL Intravenous Q12H   Continuous Infusions: . sodium chloride    . piperacillin-tazobactam (ZOSYN)  IV 3.375 g (12/29/18 1001)  . vancomycin 1,250 mg (12/29/18 0523)     LOS: 3 days    Time spent:25 mins. More than 50% of that time was spent in counseling and/or coordination of care.      Shelly Coss, MD [facility] Pager [phone]  If 7PM-7AM, please contact night-coverage [email or web address] Password [email or web address] 12/29/2018, 12:20 PM

## 2018-12-29 NOTE — Plan of Care (Signed)
  Problem: Coping: Goal: Level of anxiety will decrease Outcome: Progressing   Problem: Pain Managment: Goal: General experience of comfort will improve Outcome: Progressing   Problem: Safety: Goal: Ability to remain free from injury will improve Outcome: Progressing   

## 2018-12-29 NOTE — Care Management Important Message (Signed)
Important Message  Patient Details  Name: Kari [name] MRN: [id number] Date of Birth: 07/19/1961   Medicare Important Message Given:  Yes    Orbie Pyo 12/29/2018, 4:19 PM

## 2018-12-30 ENCOUNTER — Ambulatory Visit (INDEPENDENT_AMBULATORY_CARE_PROVIDER_SITE_OTHER): Payer: Self-pay | Admitting: Physician Assistant

## 2018-12-30 LAB — CBC WITH DIFFERENTIAL/PLATELET
Abs Immature Granulocytes: 0.08 10*3/uL — ABNORMAL HIGH (ref 0.00–0.07)
Basophils Absolute: 0 10*3/uL (ref 0.0–0.1)
Basophils Relative: 0 %
Eosinophils Absolute: 0.3 10*3/uL (ref 0.0–0.5)
Eosinophils Relative: 4 %
HCT: 27.9 % — ABNORMAL LOW (ref 36.0–46.0)
Hemoglobin: 8.2 g/dL — ABNORMAL LOW (ref 12.0–15.0)
Immature Granulocytes: 1 %
Lymphocytes Relative: 19 %
Lymphs Abs: 1.5 10*3/uL (ref 0.7–4.0)
MCH: 25.9 pg — ABNORMAL LOW (ref 26.0–34.0)
MCHC: 29.4 g/dL — ABNORMAL LOW (ref 30.0–36.0)
MCV: 88.3 fL (ref 80.0–100.0)
Monocytes Absolute: 0.8 10*3/uL (ref 0.1–1.0)
Monocytes Relative: 10 %
Neutro Abs: 5.3 10*3/uL (ref 1.7–7.7)
Neutrophils Relative %: 66 %
Platelets: 396 10*3/uL (ref 150–400)
RBC: 3.16 MIL/uL — ABNORMAL LOW (ref 3.87–5.11)
RDW: 15.6 % — ABNORMAL HIGH (ref 11.5–15.5)
WBC: 8.1 10*3/uL (ref 4.0–10.5)
nRBC: 0 % (ref 0.0–0.2)

## 2018-12-30 LAB — BASIC METABOLIC PANEL
Anion gap: 10 (ref 5–15)
BUN: 12 mg/dL (ref 6–20)
CO2: 32 mmol/L (ref 22–32)
Calcium: 8.8 mg/dL — ABNORMAL LOW (ref 8.9–10.3)
Chloride: 97 mmol/L — ABNORMAL LOW (ref 98–111)
Creatinine, Ser: 1.41 mg/dL — ABNORMAL HIGH (ref 0.44–1.00)
GFR calc Af Amer: 48 mL/min — ABNORMAL LOW (ref >60)
GFR calc non Af Amer: 42 mL/min — ABNORMAL LOW (ref >60)
Glucose, Bld: 98 mg/dL (ref 70–99)
Potassium: 3.4 mmol/L — ABNORMAL LOW (ref 3.5–5.1)
Sodium: 139 mmol/L (ref 135–145)

## 2018-12-30 LAB — CULTURE, BLOOD (ROUTINE X 2)
Culture: NO GROWTH
Special Requests: ADEQUATE

## 2018-12-30 LAB — SURGICAL PCR SCREEN
MRSA, PCR: NEGATIVE
Staphylococcus aureus: NEGATIVE

## 2018-12-30 MED ORDER — CHLORHEXIDINE GLUCONATE 4 % EX LIQD
60.0000 mL | Freq: Once | CUTANEOUS | Status: AC
  Administered 2018-12-31: 4 via TOPICAL

## 2018-12-30 MED ORDER — SODIUM CHLORIDE 0.9 % IV SOLN
2.0000 g | Freq: Three times a day (TID) | INTRAVENOUS | Status: AC
  Administered 2018-12-30 – 2019-01-08 (×27): 2 g via INTRAVENOUS
  Filled 2018-12-30 (×29): qty 2

## 2018-12-30 MED ORDER — POTASSIUM CHLORIDE CRYS ER 20 MEQ PO TBCR
40.0000 meq | EXTENDED_RELEASE_TABLET | Freq: Once | ORAL | Status: AC
  Administered 2018-12-30: 40 meq via ORAL
  Filled 2018-12-30: qty 2

## 2018-12-30 MED ORDER — METRONIDAZOLE IN NACL 5-0.79 MG/ML-% IV SOLN
500.0000 mg | Freq: Three times a day (TID) | INTRAVENOUS | Status: DC
  Administered 2018-12-30 – 2019-01-05 (×19): 500 mg via INTRAVENOUS
  Filled 2018-12-30 (×21): qty 100

## 2018-12-30 MED ORDER — CEFAZOLIN SODIUM-DEXTROSE 2-4 GM/100ML-% IV SOLN
2.0000 g | INTRAVENOUS | Status: AC
  Administered 2018-12-31: 2 g via INTRAVENOUS
  Filled 2018-12-30 (×2): qty 100

## 2018-12-30 NOTE — Progress Notes (Signed)
PROGRESS NOTE    Kari [name]  BDZ:[id number] DOB: 1961/12/01 DOA: 12/25/2018 PCP: Rutherford Guys, MD   Brief Narrative: Patient  a 58 year old female with past medical history of chronic venous stasis ulcers on bilateral lower extremities, morbid obesity, congestive heart failure, sarcoidosis who was sent from home health wound care nurse secondary to worsening bilateral ulcerations on her legs.  The ulcers were reported to be foul-smelling and there was drainage also.  No report of fever or chills at home.  Patient was admitted for management of the wound infection.  Orthopedics consulted.  Started on broad-spectrum antibiotics.  Dr. Sharol Given planning for staged  Limb salvage surgical interventions starting tomorrow.  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  She was following with vascular surgery and wound care as an outpatient. Currently patient is not septic.  Hemodynamically stable.  No fever. improving leukocytosis.  Will follow blood cultures. NGTD. Continue broad-spectrum antibiotics.  Currently on vancomycin and cefepime. Patient seen by orthopedics.  Dr. Sharol Given planning for staged  Limb salvage surgical interventions starting from Wednesday. Currently both lower extremities wrapped with dressings.Wound care following.  Chronic combined systolic and diastolic heart failure: History of nonischemic cardiomyopathy.  Left heart catheterization on 06/2017 showed normal coronary arteries, ejection fraction of 35 to 34%, combined systolic and  diastolic heart failure.  She underwent ICD placement on 06/2017.  On Lasix at home.  Continue Coreg.  CKD stage III: Baseline creatinine around 1.3-1.4.  Currently kidney function on baseline.  Hypokalemia: Supplemented with potassium.    Chronic normocytic anemia: Most likely associated  with chronic medical conditions.  Iron studies did not suggest iron deficiency anemia.  Patient denies any change the color of her stool. negative FOBT.  If hemoglobin drops further, will transfuse her.  Patient has history of blood transfusion in the past.  Hypertension: Currently blood pressure stable.  Home medications resumed.  Sarcoidosis: Currently stable.          DVT prophylaxis: Lovenox Code Status: Full Family Communication: Daughters present at the bedside Disposition Plan: Home after full work-up, OR   Consultants: Orthopedics  Procedures: None  Antimicrobials: Vancomycin, cefepime  Subjective: Patient seen and examined the bedside this morning.  Remains hemodynamically stable. Chronic  pain on bilateral lower extremities,but currently  pain looks well controlled. No other active issues.  Objective: Vitals:   12/29/18 0541 12/29/18 1329 12/29/18 2031 12/30/18 0555  BP: (!) 108/58 102/72 103/68 (!) 99/46  Pulse: 78 64 72 65  Resp: 19 18 16 16   Temp: 98.8 F (37.1 C) 98.6 F (37 C) 98.3 F (36.8 C) 98.1 F (36.7 C)  TempSrc: Oral Oral Oral Oral  SpO2: 96% 95% 97% 94%  Weight:      Height:        Intake/Output Summary (Last 24 hours) at 12/30/2018 1331 Last data filed at 12/30/2018 1250 Gross per 24 hour  Intake 0 ml  Output 1750 ml  Net -1750 ml   Filed Weights   12/25/18 1517  Weight: 115.7 kg    Examination:  General exam: Appears  calm and comfortable ,Not in distress,obese HEENT:PERRL,Oral mucosa moist, Ear/Nose normal on gross exam Respiratory system: Bilateral equal air entry, normal vesicular breath sounds, no wheezes or crackles  Cardiovascular system:  S1 & S2 heard, RRR. No JVD, murmurs, rubs, gallops or clicks. Pedal edema. Gastrointestinal system: Abdomen is nondistended, soft and nontender. No organomegaly or masses felt. Normal bowel sounds heard. Central nervous system: Alert and oriented. No focal neurological deficits. Extremities:Trace Edema on  bilateral lower extremities, bilateral lower extremities wrapped with dressing.Chronic foul smelling ulcers with drainage Skin: Venous stasis changes, ulcers on bilateral lower extremities  MSK: Normal muscle bulk,tone ,power Psychiatry: Judgement and insight appear normal. Mood & affect appropriate.     Data Reviewed: I have personally reviewed following labs and imaging studies  CBC: Recent Labs  Lab 12/25/18 1521 12/27/18 0243 12/28/18 0422 12/30/18 0451  WBC 20.0* 12.5* 8.4 8.1  NEUTROABS 16.8*  --  5.4 5.3  HGB 9.0* 7.0* 7.3* 8.2*  HCT 30.9* 23.0* 25.3* 27.9*  MCV 87.8 87.8 87.8 88.3  PLT 418* 352 331 917   Basic Metabolic Panel: Recent Labs  Lab 12/25/18 1521 12/27/18 0243 12/28/18 0422 12/30/18 0451  NA 137 137 139 139  K 3.3* 2.9* 3.5 3.4*  CL 102 103 105 97*  CO2 24 27 27  32  GLUCOSE 124* 113* 124* 98  BUN 18 15 13 12   CREATININE 1.53* 1.26* 1.21* 1.41*  CALCIUM 8.9 8.1* 8.4* 8.8*  MG  --  2.0  --   --    GFR: Estimated Creatinine Clearance: 58.5 mL/min (A) (by C-G formula based on SCr of 1.41 mg/dL (H)). Liver Function Tests: Recent Labs  Lab 12/25/18 1521  AST 11*  ALT 7  ALKPHOS 65  BILITOT 0.6  PROT 8.9*  ALBUMIN 2.2*   No results for input(s): LIPASE, AMYLASE in the last 168 hours. No results for input(s): AMMONIA in the last 168 hours. Coagulation Profile: No results for input(s): INR, PROTIME in the last 168 hours. Cardiac Enzymes: No results for input(s): CKTOTAL, CKMB, CKMBINDEX, TROPONINI in the last 168 hours. BNP (last 3 results) No results for input(s): PROBNP in the last 8760 hours. HbA1C: No results for input(s): HGBA1C in the  last 72 hours. CBG: No results for input(s): GLUCAP in the last 168 hours. Lipid Profile: No results for input(s): CHOL, HDL, LDLCALC, TRIG, CHOLHDL, LDLDIRECT in the last 72 hours. Thyroid Function Tests: No results for input(s): TSH, T4TOTAL, FREET4, T3FREE, THYROIDAB in the last 72 hours. Anemia Panel: No results for input(s): VITAMINB12, FOLATE, FERRITIN, TIBC, IRON, RETICCTPCT in the last 72 hours. Sepsis Labs: Recent Labs  Lab 12/25/18 1606  LATICACIDVEN 1.71    Recent Results (from the past 240 hour(s))  Culture, blood (Routine x 2)     Status: Abnormal (Preliminary result)   Collection Time: 12/25/18  3:45 PM  Result Value Ref Range Status   Specimen Description BLOOD RIGHT HAND  Final   Special Requests   Final    BOTTLES DRAWN AEROBIC ONLY Blood Culture adequate volume   Culture  Setup Time   Final    GRAM POSITIVE COCCI AEROBIC BOTTLE ONLY Organism ID to follow CRITICAL RESULT CALLED TO, READ BACK BY AND VERIFIED WITH: Dion Body [facility] 12/29/18 1942 JDW    Culture (A)  Final    MICROCOCCUS SPECIES Standardized susceptibility testing for this organism is not available. Performed at [facility], [place] [address], [place], [place] [address]    Report Status PENDING  Incomplete  Blood Culture ID Panel (Reflexed)     Status: None   Collection Time: 12/25/18  3:45 PM  Result Value Ref Range Status   Enterococcus species NOT DETECTED NOT DETECTED Final    Comment: CRITICAL RESULT CALLED TO, READ BACK BY AND VERIFIED WITH: J MILLEN PHARMD 12/29/18 1942 JDW    Listeria monocytogenes NOT DETECTED NOT DETECTED Final   Staphylococcus species NOT DETECTED NOT DETECTED Final   Staphylococcus aureus (BCID) NOT DETECTED NOT DETECTED Final   Streptococcus species NOT DETECTED NOT DETECTED Final   Streptococcus agalactiae NOT DETECTED NOT DETECTED Final   Streptococcus pneumoniae NOT DETECTED NOT DETECTED Final   Streptococcus pyogenes NOT DETECTED NOT DETECTED Final    Acinetobacter baumannii NOT DETECTED NOT DETECTED Final   Enterobacteriaceae species NOT DETECTED NOT DETECTED Final   Enterobacter cloacae complex NOT DETECTED NOT DETECTED Final   Escherichia coli NOT DETECTED NOT DETECTED Final   Klebsiella oxytoca NOT DETECTED NOT DETECTED Final   Klebsiella pneumoniae NOT DETECTED NOT DETECTED Final   Proteus species NOT DETECTED NOT DETECTED Final   Serratia marcescens NOT DETECTED NOT DETECTED Final   Haemophilus influenzae NOT DETECTED NOT DETECTED Final   Neisseria meningitidis NOT DETECTED NOT DETECTED Final   Pseudomonas aeruginosa NOT DETECTED NOT DETECTED Final   Candida albicans NOT DETECTED NOT DETECTED Final   Candida glabrata NOT DETECTED NOT DETECTED Final   Candida krusei NOT DETECTED NOT DETECTED Final   Candida parapsilosis NOT DETECTED NOT DETECTED Final   Candida tropicalis NOT DETECTED NOT DETECTED Final    Comment: Performed at [facility], [place]. [address] [place], [place] [address]  Culture, blood (Routine x 2)     Status: None   Collection Time: 12/25/18  3:51 PM  Result Value Ref Range Status   Specimen Description BLOOD RIGHT ANTECUBITAL  Final   Special Requests   Final    BOTTLES DRAWN AEROBIC AND ANAEROBIC Blood Culture adequate volume   Culture   Final    NO GROWTH 5 DAYS Performed at [facility], [place] [address] [place], [place] [address]    Report Status 12/30/2018 FINAL  Final  MRSA PCR Screening     Status: None   Collection Time: 12/26/18  7:01 AM  Result Value Ref Range Status   MRSA by PCR NEGATIVE NEGATIVE Final    Comment:        The GeneXpert MRSA Assay (FDA approved for NASAL specimens only), is one component of a comprehensive MRSA colonization surveillance program. It is not intended to diagnose MRSA infection nor to guide or monitor treatment for MRSA infections. Performed at [facility], [place] [address], [place], [place] [address]          Radiology Studies: No  results found.      Scheduled Meds: . carvedilol  6.25 mg Oral BID  . docusate sodium  100 mg Oral Daily  . enoxaparin (LOVENOX) injection  40 mg Subcutaneous Q24H  . febuxostat  40 mg Oral Daily  . furosemide  60 mg Oral Daily  . potassium chloride  40 mEq Oral Once  . sodium chloride flush  3 mL Intravenous Q12H   Continuous Infusions: . sodium chloride    . ceFEPime (MAXIPIME) IV    . metronidazole    . vancomycin 1,250 mg (12/30/18 0517)     LOS: 4 days    Time spent:25 mins. More than 50% of that time was spent in counseling  and/or coordination of care.      Shelly Coss, MD [facility] Pager [phone]  If 7PM-7AM, please contact night-coverage [email or web address] Password [email or web address] 12/30/2018, 1:31 PM

## 2018-12-30 NOTE — Progress Notes (Signed)
Pharmacy Antibiotic Note  Kari [name] is a 58 y.o. female admitted on 12/25/2018 with bilateral lower extremity  wound infection.  Pharmacy initially consulted for Vancomycin and Zosyn. Due to patient's increase in sCr and likely need for long term antibiotics, antibiotics changed from Zosyn to Cefepime and Metronidazole. Currently wound salvage procedure planned for 1/8. WBCs WNL, afebrile.   Plan: Continue vancomycin 1,250mg  IV Q24h Cefepime 2g IV Q8H  Metronidazole 500mg  Q8H  Monitor clinical picture, renal function,vanc levels prn F/U C&S, abx deescalation / LOT   Height: 5\' 7"  (170.2 cm) Weight: 255 lb (115.7 kg) IBW/kg (Calculated) : 61.6  Temp (24hrs), Avg:98.3 F (36.8 C), Min:98.1 F (36.7 C), Max:98.6 F (37 C)  Recent Labs  Lab 12/25/18 1521 12/25/18 1606 12/27/18 0243 12/28/18 0422 12/30/18 0451  WBC 20.0*  --  12.5* 8.4 8.1  CREATININE 1.53*  --  1.26* 1.21* 1.41*  LATICACIDVEN  --  1.71  --   --   --     Estimated Creatinine Clearance: 58.5 mL/min (A) (by C-G formula based on SCr of 1.41 mg/dL (H)).    Allergies  Allergen Reactions  . Tramadol Other (See Comments)    hallucination   Vanc 1/3 >> Zosyn 1/3 >> 1/7 Cefepime 1/7>> Flagyl 1/7>>  Cultures: 1/2 Bcx: GPC results pending, BCID neg 1/3 MRSA PCR neg   Harrietta Guardian, PharmD PGY1 Pharmacy Resident 12/30/2018    9:57 AM Please check AMION for all [place] numbers

## 2018-12-31 ENCOUNTER — Encounter (HOSPITAL_COMMUNITY): Payer: Self-pay | Admitting: Orthopedic Surgery

## 2018-12-31 ENCOUNTER — Encounter (HOSPITAL_COMMUNITY)
Admission: EM | Disposition: A | Discharge status: Home Health | Payer: Self-pay | Source: Home / Self Care | Attending: Internal Medicine

## 2018-12-31 ENCOUNTER — Inpatient Hospital Stay (HOSPITAL_COMMUNITY): Payer: Medicare Other | Admitting: Anesthesiology

## 2018-12-31 DIAGNOSIS — L03116 Cellulitis of left lower limb: Principal | ICD-10-CM

## 2018-12-31 DIAGNOSIS — L03115 Cellulitis of right lower limb: Secondary | ICD-10-CM

## 2018-12-31 HISTORY — PX: I&D EXTREMITY: SHX5045

## 2018-12-31 LAB — CULTURE, BLOOD (ROUTINE X 2): Special Requests: ADEQUATE

## 2018-12-31 LAB — VANCOMYCIN, PEAK: Vancomycin Pk: 43 ug/mL — ABNORMAL HIGH (ref 30–40)

## 2018-12-31 SURGERY — IRRIGATION AND DEBRIDEMENT EXTREMITY
Anesthesia: General | Laterality: Bilateral

## 2018-12-31 MED ORDER — OXYCODONE HCL 5 MG PO TABS: 5.0000 mg | ORAL_TABLET | Freq: Once | ORAL | Status: DC | PRN

## 2018-12-31 MED ORDER — ONDANSETRON HCL 4 MG/2ML IJ SOLN
INTRAMUSCULAR | Status: AC
  Filled 2018-12-31: qty 2

## 2018-12-31 MED ORDER — LIDOCAINE 2% (20 MG/ML) 5 ML SYRINGE
INTRAMUSCULAR | Status: AC
  Filled 2018-12-31: qty 5

## 2018-12-31 MED ORDER — PHENYLEPHRINE 40 MCG/ML (10ML) SYRINGE FOR IV PUSH (FOR BLOOD PRESSURE SUPPORT)
PREFILLED_SYRINGE | INTRAVENOUS | Status: AC
  Filled 2018-12-31: qty 10

## 2018-12-31 MED ORDER — GLYCOPYRROLATE 0.2 MG/ML IJ SOLN
INTRAMUSCULAR | Status: DC | PRN
  Administered 2018-12-31: 0.2 mg via INTRAVENOUS

## 2018-12-31 MED ORDER — MIDAZOLAM HCL 2 MG/2ML IJ SOLN
INTRAMUSCULAR | Status: AC
  Filled 2018-12-31: qty 2

## 2018-12-31 MED ORDER — OXYCODONE HCL 5 MG/5ML PO SOLN: 5.0000 mg | Freq: Once | ORAL | Status: DC | PRN

## 2018-12-31 MED ORDER — 0.9 % SODIUM CHLORIDE (POUR BTL) OPTIME
TOPICAL | Status: DC | PRN
  Administered 2018-12-31: 1000 mL

## 2018-12-31 MED ORDER — FENTANYL CITRATE (PF) 250 MCG/5ML IJ SOLN
INTRAMUSCULAR | Status: AC
  Filled 2018-12-31: qty 5

## 2018-12-31 MED ORDER — HYDROMORPHONE HCL 1 MG/ML IJ SOLN
INTRAMUSCULAR | Status: DC | PRN
  Administered 2018-12-31: .5 mg via INTRAVENOUS
  Administered 2018-12-31: 0.5 mg via INTRAVENOUS

## 2018-12-31 MED ORDER — PROPOFOL 10 MG/ML IV BOLUS
INTRAVENOUS | Status: AC
  Filled 2018-12-31: qty 20

## 2018-12-31 MED ORDER — LIDOCAINE HCL (CARDIAC) PF 100 MG/5ML IV SOSY
PREFILLED_SYRINGE | INTRAVENOUS | Status: DC | PRN
  Administered 2018-12-31: 60 mg via INTRATRACHEAL

## 2018-12-31 MED ORDER — ONDANSETRON HCL 4 MG/2ML IJ SOLN
INTRAMUSCULAR | Status: DC | PRN
  Administered 2018-12-31: 4 mg via INTRAVENOUS

## 2018-12-31 MED ORDER — MIDAZOLAM HCL 2 MG/2ML IJ SOLN
INTRAMUSCULAR | Status: DC | PRN
  Administered 2018-12-31: 0.5 mg via INTRAVENOUS
  Administered 2018-12-31 (×2): 1 mg via INTRAVENOUS
  Administered 2018-12-31: 0.5 mg via INTRAVENOUS

## 2018-12-31 MED ORDER — ONDANSETRON HCL 4 MG/2ML IJ SOLN: 4.0000 mg | Freq: Once | INTRAMUSCULAR | Status: DC | PRN

## 2018-12-31 MED ORDER — PHENYLEPHRINE HCL 10 MG/ML IJ SOLN
INTRAMUSCULAR | Status: DC | PRN
  Administered 2018-12-31: 120 ug via INTRAVENOUS
  Administered 2018-12-31 (×2): 80 ug via INTRAVENOUS
  Administered 2018-12-31: 120 ug via INTRAVENOUS

## 2018-12-31 MED ORDER — FENTANYL CITRATE (PF) 100 MCG/2ML IJ SOLN
25.0000 ug | INTRAMUSCULAR | Status: DC | PRN
  Administered 2018-12-31 (×2): 50 ug via INTRAVENOUS

## 2018-12-31 MED ORDER — HYDROMORPHONE HCL 1 MG/ML IJ SOLN
INTRAMUSCULAR | Status: AC
  Filled 2018-12-31: qty 0.5

## 2018-12-31 MED ORDER — FENTANYL CITRATE (PF) 250 MCG/5ML IJ SOLN
INTRAMUSCULAR | Status: DC | PRN
  Administered 2018-12-31 (×2): 50 ug via INTRAVENOUS

## 2018-12-31 MED ORDER — FENTANYL CITRATE (PF) 100 MCG/2ML IJ SOLN
INTRAMUSCULAR | Status: AC
  Filled 2018-12-31: qty 2

## 2018-12-31 MED ORDER — GLYCOPYRROLATE PF 0.2 MG/ML IJ SOSY
PREFILLED_SYRINGE | INTRAMUSCULAR | Status: AC
  Filled 2018-12-31: qty 1

## 2018-12-31 MED ORDER — PROPOFOL 10 MG/ML IV BOLUS
INTRAVENOUS | Status: DC | PRN
  Administered 2018-12-31: 125 mg via INTRAVENOUS

## 2018-12-31 MED ORDER — LACTATED RINGERS IV SOLN
INTRAVENOUS | Status: DC
  Administered 2018-12-31: 13:00:00 via INTRAVENOUS

## 2018-12-31 SURGICAL SUPPLY — 24 items
BLADE SURG 21 STRL SS (BLADE) ×3 IMPLANT
BNDG COHESIVE 6X5 TAN STRL LF (GAUZE/BANDAGES/DRESSINGS) ×8 IMPLANT
CANISTER WOUND CARE 500ML ATS (WOUND CARE) ×8 IMPLANT
COVER SURGICAL LIGHT HANDLE (MISCELLANEOUS) ×4 IMPLANT
DRAPE U-SHAPE 47X51 STRL (DRAPES) ×3 IMPLANT
DRSG VAC ATS MED SENSATRAC (GAUZE/BANDAGES/DRESSINGS) ×8 IMPLANT
ELECT REM PT RETURN 9FT ADLT (ELECTROSURGICAL)
ELECTRODE REM PT RTRN 9FT ADLT (ELECTROSURGICAL) IMPLANT
GLOVE BIOGEL PI IND STRL 9 (GLOVE) ×1 IMPLANT
GLOVE BIOGEL PI INDICATOR 9 (GLOVE) ×2
GLOVE SURG ORTHO 9.0 STRL STRW (GLOVE) ×3 IMPLANT
GOWN STRL REUS W/ TWL XL LVL3 (GOWN DISPOSABLE) ×2 IMPLANT
GOWN STRL REUS W/TWL XL LVL3 (GOWN DISPOSABLE) ×4
KIT BASIN OR (CUSTOM PROCEDURE TRAY) ×3 IMPLANT
KIT TURNOVER KIT B (KITS) ×3 IMPLANT
MANIFOLD NEPTUNE II (INSTRUMENTS) ×3 IMPLANT
NS IRRIG 1000ML POUR BTL (IV SOLUTION) ×3 IMPLANT
PACK ORTHO EXTREMITY (CUSTOM PROCEDURE TRAY) ×3 IMPLANT
PAD ARMBOARD 7.5X6 YLW CONV (MISCELLANEOUS) ×6 IMPLANT
PAD NEG PRESSURE SENSATRAC (MISCELLANEOUS) ×4 IMPLANT
STOCKINETTE IMPERVIOUS 9X36 MD (GAUZE/BANDAGES/DRESSINGS) ×2 IMPLANT
TUBE CONNECTING 12'X1/4 (SUCTIONS) ×1
TUBE CONNECTING 12X1/4 (SUCTIONS) ×2 IMPLANT
YANKAUER SUCT BULB TIP NO VENT (SUCTIONS) ×3 IMPLANT

## 2018-12-31 NOTE — Interval H&P Note (Signed)
History and Physical Interval Note:  12/31/2018 7:01 AM  Kari [name]  has presented today for surgery, with the diagnosis of Venous Stasis Insufficiency Ulceration Bilateral Legs  The various methods of treatment have been discussed with the patient and family. After consideration of risks, benefits and other options for treatment, the patient has consented to  Procedure(s): DEBRIDEMENT BILATERAL LEGS, APPLY VAC X 2 (Bilateral) as a surgical intervention .  The patient's history has been reviewed, patient examined, no change in status, stable for surgery.  I have reviewed the patient's chart and labs.  Questions were answered to the patient's satisfaction.     [name]

## 2018-12-31 NOTE — Progress Notes (Signed)
PROGRESS NOTE    Kari [name]  UXY:[id number] DOB: 11-Apr-1961 DOA: 12/25/2018 PCP: Rutherford Guys, MD   Brief Narrative: Patient  a 58 year old female with past medical history of chronic venous stasis ulcers on bilateral lower extremities, morbid obesity, congestive heart failure, sarcoidosis who was sent from home health wound care nurse secondary to worsening bilateral necrotic venous ulcerations on her legs.  The ulcers were reported to be foul-smelling and there was drainage also.  No report of fever or chills at home.  Patient was admitted for management of the wound infection.  Orthopedics consulted.  Started on broad-spectrum antibiotics.  Dr. Sharol Given planning for staged  limb salvage surgical interventions starting today.  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  She was following with vascular surgery and wound care as an outpatient. Currently patient is not septic.  Hemodynamically stable.  No fever. improving leukocytosis.  Will follow blood cultures. NGTD. Continue broad-spectrum antibiotics.  Currently on vancomycin and cefepime. Patient seen by orthopedics.  Dr. Sharol Given planning for staged  Limb salvage surgical interventions starting from 12/31/18.. Currently both lower extremities wrapped with dressings.Wound care following.  Chronic combined systolic and diastolic heart failure: History of nonischemic cardiomyopathy.  Left heart catheterization on 06/2017 showed normal coronary arteries, ejection fraction of 35 to 40%,  combined systolic and diastolic heart failure.  She underwent ICD placement on 06/2017.  On Lasix at home.  Continue Coreg.  CKD stage III: Baseline creatinine around 1.3-1.4.  Currently kidney function on baseline.  Hypokalemia: Supplemented with potassium.    Chronic normocytic anemia: Most likely associated  with chronic medical conditions.  Iron studies did not suggest iron deficiency anemia.  Patient denies any change the color of her stool. negative FOBT.  If hemoglobin drops further, will transfuse her.  Patient has history of blood transfusion in the past.  Hypertension: Currently blood pressure stable.  Home medications resumed.  Sarcoidosis: Currently stable.          DVT prophylaxis: Lovenox Code Status: Full Family Communication: Daughters present at the bedside Disposition Plan: Home after full work-up, OR   Consultants: Orthopedics  Procedures: None  Antimicrobials: Vancomycin, cefepime  Subjective: Patient seen and examined the bedside this morning.  Remains hemodynamically stable. Chronic  pain on bilateral lower extremities,but currently  pain looks well controlled. No other active issues.Awaiting OR today.  Objective: Vitals:   12/30/18 0555 12/30/18 1358 12/30/18 2201 12/31/18 0611  BP: (!) 99/46 (!) 107/59 101/66 113/73  Pulse: 65 70 70 72  Resp: 16 16 20 18   Temp: 98.1 F (36.7 C) 98.6 F (37 C) 98.5 F (36.9 C) 98.5 F (36.9 C)  TempSrc: Oral Oral Oral Oral  SpO2: 94% 98% 95% 96%  Weight:      Height:        Intake/Output Summary (Last 24 hours) at 12/31/2018 1128 Last data filed at 12/31/2018 1660 Gross per 24 hour  Intake 1090 ml  Output 2851 ml  Net -1761 ml   Filed Weights   12/25/18 1517  Weight: 115.7 kg  Examination:  General exam: Appears calm and comfortable ,Not in distress,obese HEENT:PERRL,Oral mucosa moist, Ear/Nose normal on gross exam Respiratory system: Bilateral equal air entry, normal vesicular breath sounds, no  wheezes or crackles  Cardiovascular system: S1 & S2 heard, RRR. No JVD, murmurs, rubs, gallops or clicks. Pedal edema. Gastrointestinal system: Abdomen is nondistended, soft and nontender. No organomegaly or masses felt. Normal bowel sounds heard. Central nervous system: Alert and oriented. No focal neurological deficits. Extremities:Trace Edema on  bilateral lower extremities, bilateral lower extremities wrapped with dressing.Chronic foul smelling ulcers with drainage Skin: Venous stasis changes, ulcers on bilateral lower extremities  MSK: Normal muscle bulk,tone ,power Psychiatry: Judgement and insight appear normal. Mood & affect appropriate.     Data Reviewed: I have personally reviewed following labs and imaging studies  CBC: Recent Labs  Lab 12/25/18 1521 12/27/18 0243 12/28/18 0422 12/30/18 0451  WBC 20.0* 12.5* 8.4 8.1  NEUTROABS 16.8*  --  5.4 5.3  HGB 9.0* 7.0* 7.3* 8.2*  HCT 30.9* 23.0* 25.3* 27.9*  MCV 87.8 87.8 87.8 88.3  PLT 418* 352 331 956   Basic Metabolic Panel: Recent Labs  Lab 12/25/18 1521 12/27/18 0243 12/28/18 0422 12/30/18 0451  NA 137 137 139 139  K 3.3* 2.9* 3.5 3.4*  CL 102 103 105 97*  CO2 24 27 27  32  GLUCOSE 124* 113* 124* 98  BUN 18 15 13 12   CREATININE 1.53* 1.26* 1.21* 1.41*  CALCIUM 8.9 8.1* 8.4* 8.8*  MG  --  2.0  --   --    GFR: Estimated Creatinine Clearance: 58.5 mL/min (A) (by C-G formula based on SCr of 1.41 mg/dL (H)). Liver Function Tests: Recent Labs  Lab 12/25/18 1521  AST 11*  ALT 7  ALKPHOS 65  BILITOT 0.6  PROT 8.9*  ALBUMIN 2.2*   No results for input(s): LIPASE, AMYLASE in the last 168 hours. No results for input(s): AMMONIA in the last 168 hours. Coagulation Profile: No results for input(s): INR, PROTIME in the last 168 hours. Cardiac Enzymes: No results for input(s): CKTOTAL, CKMB, CKMBINDEX, TROPONINI in the last 168 hours. BNP (last 3 results) No results for input(s): PROBNP in the last 8760  hours. HbA1C: No results for input(s): HGBA1C in the last 72 hours. CBG: No results for input(s): GLUCAP in the last 168 hours. Lipid Profile: No results for input(s): CHOL, HDL, LDLCALC, TRIG, CHOLHDL, LDLDIRECT in the last 72 hours. Thyroid Function Tests: No results for input(s): TSH, T4TOTAL, FREET4, T3FREE, THYROIDAB in the last 72 hours. Anemia Panel: No results for input(s): VITAMINB12, FOLATE, FERRITIN, TIBC, IRON, RETICCTPCT in the last 72 hours. Sepsis Labs: Recent Labs  Lab 12/25/18 1606  LATICACIDVEN 1.71    Recent Results (from the past 240 hour(s))  Culture, blood (Routine x 2)     Status: Abnormal   Collection Time: 12/25/18  3:45 PM  Result Value Ref Range Status   Specimen Description BLOOD RIGHT HAND  Final   Special Requests   Final    BOTTLES DRAWN AEROBIC ONLY Blood Culture adequate volume   Culture  Setup Time   Final    GRAM POSITIVE COCCI AEROBIC BOTTLE ONLY Organism ID to follow CRITICAL RESULT CALLED TO, READ BACK BY AND VERIFIED WITH: Dion Body [facility] 12/29/18 1942 JDW    Culture (A)  Final    MICROCOCCUS SPECIES Standardized susceptibility testing for this organism is not available. Performed at [facility], [place] [address], [place], [place] [address]    Report  Status 12/31/2018 FINAL  Final  Blood Culture ID Panel (Reflexed)     Status: None   Collection Time: 12/25/18  3:45 PM  Result Value Ref Range Status   Enterococcus species NOT DETECTED NOT DETECTED Final    Comment: CRITICAL RESULT CALLED TO, READ BACK BY AND VERIFIED WITH: J MILLEN PHARMD 12/29/18 1942 JDW    Listeria monocytogenes NOT DETECTED NOT DETECTED Final   Staphylococcus species NOT DETECTED NOT DETECTED Final   Staphylococcus aureus (BCID) NOT DETECTED NOT DETECTED Final   Streptococcus species NOT DETECTED NOT DETECTED Final   Streptococcus agalactiae NOT DETECTED NOT DETECTED Final   Streptococcus pneumoniae NOT DETECTED NOT DETECTED Final   Streptococcus pyogenes  NOT DETECTED NOT DETECTED Final   Acinetobacter baumannii NOT DETECTED NOT DETECTED Final   Enterobacteriaceae species NOT DETECTED NOT DETECTED Final   Enterobacter cloacae complex NOT DETECTED NOT DETECTED Final   Escherichia coli NOT DETECTED NOT DETECTED Final   Klebsiella oxytoca NOT DETECTED NOT DETECTED Final   Klebsiella pneumoniae NOT DETECTED NOT DETECTED Final   Proteus species NOT DETECTED NOT DETECTED Final   Serratia marcescens NOT DETECTED NOT DETECTED Final   Haemophilus influenzae NOT DETECTED NOT DETECTED Final   Neisseria meningitidis NOT DETECTED NOT DETECTED Final   Pseudomonas aeruginosa NOT DETECTED NOT DETECTED Final   Candida albicans NOT DETECTED NOT DETECTED Final   Candida glabrata NOT DETECTED NOT DETECTED Final   Candida krusei NOT DETECTED NOT DETECTED Final   Candida parapsilosis NOT DETECTED NOT DETECTED Final   Candida tropicalis NOT DETECTED NOT DETECTED Final    Comment: Performed at [facility], [address], [place], [place] [address]  Culture, blood (Routine x 2)     Status: None   Collection Time: 12/25/18  3:51 PM  Result Value Ref Range Status   Specimen Description BLOOD RIGHT ANTECUBITAL  Final   Special Requests   Final    BOTTLES DRAWN AEROBIC AND ANAEROBIC Blood Culture adequate volume   Culture   Final    NO GROWTH 5 DAYS Performed at [facility], [place] [address] [place], [place] [address]    Report Status 12/30/2018 FINAL  Final  MRSA PCR Screening     Status: None   Collection Time: 12/26/18  7:01 AM  Result Value Ref Range Status   MRSA by PCR NEGATIVE NEGATIVE Final    Comment:        The GeneXpert MRSA Assay (FDA approved for NASAL specimens only), is one component of a comprehensive MRSA colonization surveillance program. It is not intended to diagnose MRSA infection nor to guide or monitor treatment for MRSA infections. Performed at [facility], [place] [address], [place], [place] [address]    Surgical pcr screen     Status: None   Collection Time: 12/30/18  7:01 PM  Result Value Ref Range Status   MRSA, PCR NEGATIVE NEGATIVE Final   Staphylococcus aureus NEGATIVE NEGATIVE Final    Comment: (NOTE) The Xpert SA Assay (FDA approved for NASAL specimens in patients 41 years of age and older), is one component of a comprehensive surveillance program. It is not intended to diagnose infection nor to guide or monitor treatment. Performed at [facility], [place] [address], [place], [place] [address]          Radiology Studies: No results found.      Scheduled Meds: . carvedilol  6.25 mg Oral BID  . docusate sodium  100  mg Oral Daily  . enoxaparin (LOVENOX) injection  40 mg Subcutaneous Q24H  . febuxostat  40 mg Oral Daily  . furosemide  60 mg Oral Daily  . sodium chloride flush  3 mL Intravenous Q12H   Continuous Infusions: . sodium chloride    .  ceFAZolin (ANCEF) IV    . ceFEPime (MAXIPIME) IV 2 g (12/31/18 0606)  . metronidazole 500 mg (12/31/18 1044)  . vancomycin 1,250 mg (12/31/18 0606)     LOS: 5 days    Time spent:25 mins. More than 50% of that time was spent in counseling and/or coordination of care.      Shelly Coss, MD [facility] Pager [phone]  If 7PM-7AM, please contact night-coverage [email or web address] Password [facility] 12/31/2018, 11:28 AM

## 2018-12-31 NOTE — Anesthesia Preprocedure Evaluation (Addendum)
Anesthesia Evaluation  Patient identified by MRN, date of birth, ID band Patient awake    Reviewed: Allergy & Precautions, NPO status , Patient's Chart, lab work & pertinent test results  History of Anesthesia Complications Negative for: history of anesthetic complications  Airway Mallampati: IV  TM Distance: >3 FB Neck ROM: Full    Dental  (+) Dental Advisory Given   Pulmonary asthma ,   Sarcoidosis    breath sounds clear to auscultation       Cardiovascular hypertension, Pt. on home beta blockers and Pt. on medications + Peripheral Vascular Disease and +CHF  + Cardiac Defibrillator  Rhythm:Regular Rate:Normal   '18 Cath - Left dominant coronary anatomy. Normal coronary arteries. Mild to moderate global left ventricular hypo-kinesis with estimated EF 35-40%. LV end-diastolic pressure is up and normal. Combined systolic and diastolic heart failure, chronic  '18 TTE - Diffuse hypokinesis worse in the inferior basal   Wall. LV cavity size was moderately dilated. Moderate LVH. EF 40% to 45%. Mild AI, moderate MR. LA was moderately dilated.    Neuro/Psych  Headaches, PSYCHIATRIC DISORDERS Depression    GI/Hepatic negative GI ROS, Neg liver ROS,   Endo/Other  Morbid obesity  Renal/GU Renal InsufficiencyRenal disease     Musculoskeletal negative musculoskeletal ROS (+)   Abdominal   Peds  Hematology negative hematology ROS (+) anemia ,   Anesthesia Other Findings   Reproductive/Obstetrics                            Anesthesia Physical Anesthesia Plan  ASA: III  Anesthesia Plan: General   Post-op Pain Management:    Induction: Intravenous  PONV Risk Score and Plan: 3 and Treatment may vary due to age or medical condition, Ondansetron and Midazolam  Airway Management Planned: LMA  Additional Equipment: None  Intra-op Plan:   Post-operative Plan: Extubation in OR  Informed  Consent: I have reviewed the patients History and Physical, chart, labs and discussed the procedure including the risks, benefits and alternatives for the proposed anesthesia with the patient or authorized representative who has indicated his/her understanding and acceptance.   Dental advisory given  Plan Discussed with: CRNA and Anesthesiologist  Anesthesia Plan Comments:        Anesthesia Quick Evaluation

## 2018-12-31 NOTE — Anesthesia Postprocedure Evaluation (Signed)
Anesthesia Post Note  Patient: Kari [name]  Procedure(s) Performed: DEBRIDEMENT BILATERAL LEGS, APPLY VAC X 2 (Bilateral )     Patient location during evaluation: PACU Anesthesia Type: General Level of consciousness: awake and alert Pain management: pain level controlled Vital Signs Assessment: post-procedure vital signs reviewed and stable Respiratory status: spontaneous breathing, nonlabored ventilation, respiratory function stable and patient connected to nasal cannula oxygen Cardiovascular status: blood pressure returned to baseline and stable Postop Assessment: no apparent nausea or vomiting Anesthetic complications: no    Last Vitals:  Vitals:   12/31/18 1545 12/31/18 1601  BP: (!) 144/93 (!) 134/99  Pulse: 94 84  Resp: 18 20  Temp: (!) 36.1 C   SpO2: 99% 100%    Last Pain:  Vitals:   12/31/18 1615  TempSrc:   PainSc: Asleep                 Evella Kasal S

## 2018-12-31 NOTE — Anesthesia Procedure Notes (Signed)
Procedure Name: LMA Insertion Date/Time: 12/31/2018 3:00 PM Performed by: Myrtie Soman, MD Pre-anesthesia Checklist: Patient identified, Emergency Drugs available, Suction available and Patient being monitored Patient Re-evaluated:Patient Re-evaluated prior to induction Oxygen Delivery Method: Circle system utilized Induction Type: IV induction LMA: LMA inserted LMA Size: 4.0 Number of attempts: 1 Placement Confirmation: positive ETCO2,  CO2 detector and breath sounds checked- equal and bilateral Tube secured with: Tape

## 2018-12-31 NOTE — Op Note (Signed)
12/31/2018  3:38 PM  PATIENT:  Kari [name]    PRE-OPERATIVE DIAGNOSIS:  Venous Stasis Insufficiency Ulceration Bilateral Legs  POST-OPERATIVE DIAGNOSIS:  Same  PROCEDURE:  DEBRIDEMENT BILATERAL LEGS with excision skin and soft tissue muscle and fascia, Right leg wound 16 x 18 cm. Left leg ulcer 12 x 16 cm. With the stage APPLY VAC X 2  SURGEON:  [name], MD  PHYSICIAN ASSISTANT:None ANESTHESIA:   General  PREOPERATIVE INDICATIONS:  [name] is a  58 y.o. female with a diagnosis of Venous Stasis Insufficiency Ulceration Bilateral Legs who failed conservative measures and elected for surgical management.    The risks benefits and alternatives were discussed with the patient preoperatively including but not limited to the risks of infection, bleeding, nerve injury, cardiopulmonary complications, the need for revision surgery, among others, and the patient was willing to proceed.  OPERATIVE IMPLANTS: Cleanse choice wound vacs x2  for each leg.  @ENCIMAGES @  OPERATIVE FINDINGS: Hemosiderin staining skin and soft tissue without abscess without necrotic fascia.  OPERATIVE PROCEDURE: Patient was brought to the operating room and underwent a general anesthetic.  After adequate levels anesthesia were obtained patient's both lower extremities were prepped using Betadine paint and draped into a sterile field a timeout was called.  A 10 blade knife was used to excise skin and soft tissue muscle and fascia on both legs.  There was hemosiderin in the soft tissue and skin.  There is no abscess no signs of any deep infection no signs of necrotizing fasciitis.  After excisional debridement of both legs the wounds were irrigated with normal saline the cleanse choice reticulated foam was applied followed by the solid foam followed by [place] this was hooked to a wound VAC and have a good suction fit.  This was performed on both legs.  Patient was extubated taken to PACU in stable  condition.     DISCHARGE PLANNING:  Antibiotic duration: Continue current IV antibiotics  Weightbearing: Weightbearing as tolerated both limbs  Pain medication: High-dose opioid pathway  Dressing care/ Wound VAC: Continue wound VAC patient will be brought back to the operating room on Friday.  Ambulatory devices: Walker.  Discharge to: Back to the floor to continue IV antibiotics compression and wound VAC.  Will repeat debridement on Friday with anticipation of split-thickness skin graft next week on Wednesday.  Follow-up: In the office 1 week post operative.

## 2018-12-31 NOTE — Transfer of Care (Signed)
Immediate Anesthesia Transfer of Care Note  Patient: Kari [name]  Procedure(s) Performed: DEBRIDEMENT BILATERAL LEGS, APPLY VAC X 2 (Bilateral )  Patient Location: PACU  Anesthesia Type:General  Level of Consciousness: awake, alert  and patient cooperative  Airway & Oxygen Therapy: Patient Spontanous Breathing and Patient connected to nasal cannula oxygen  Post-op Assessment: Report given to RN, Post -op Vital signs reviewed and stable, Patient moving all extremities X 4 and Patient able to stick tongue midline  Post vital signs: Reviewed and stable  Last Vitals:  Vitals Value Taken Time  BP    Temp    Pulse 91 12/31/2018  3:42 PM  Resp 11 12/31/2018  3:43 PM  SpO2 97 % 12/31/2018  3:42 PM  Vitals shown include unvalidated device data.  Last Pain:  Vitals:   12/31/18 0851  TempSrc:   PainSc: 0-No pain      Patients Stated Pain Goal: 2 (60/15/61 5379)  Complications: No apparent anesthesia complications

## 2019-01-01 ENCOUNTER — Ambulatory Visit (INDEPENDENT_AMBULATORY_CARE_PROVIDER_SITE_OTHER): Payer: Self-pay | Admitting: Physician Assistant

## 2019-01-01 ENCOUNTER — Encounter (HOSPITAL_COMMUNITY): Payer: Self-pay | Admitting: Orthopedic Surgery

## 2019-01-01 LAB — CBC WITH DIFFERENTIAL/PLATELET
Abs Immature Granulocytes: 0.1 10*3/uL — ABNORMAL HIGH (ref 0.00–0.07)
Basophils Absolute: 0 10*3/uL (ref 0.0–0.1)
Basophils Relative: 0 %
Eosinophils Absolute: 0.2 10*3/uL (ref 0.0–0.5)
Eosinophils Relative: 2 %
HCT: 26.3 % — ABNORMAL LOW (ref 36.0–46.0)
Hemoglobin: 7.7 g/dL — ABNORMAL LOW (ref 12.0–15.0)
Immature Granulocytes: 1 %
Lymphocytes Relative: 12 %
Lymphs Abs: 1.6 10*3/uL (ref 0.7–4.0)
MCH: 26 pg (ref 26.0–34.0)
MCHC: 29.3 g/dL — ABNORMAL LOW (ref 30.0–36.0)
MCV: 88.9 fL (ref 80.0–100.0)
Monocytes Absolute: 1 10*3/uL (ref 0.1–1.0)
Monocytes Relative: 7 %
Neutro Abs: 10.8 10*3/uL — ABNORMAL HIGH (ref 1.7–7.7)
Neutrophils Relative %: 78 %
Platelets: 391 10*3/uL (ref 150–400)
RBC: 2.96 MIL/uL — ABNORMAL LOW (ref 3.87–5.11)
RDW: 15.9 % — ABNORMAL HIGH (ref 11.5–15.5)
WBC: 13.8 10*3/uL — ABNORMAL HIGH (ref 4.0–10.5)
nRBC: 0 % (ref 0.0–0.2)

## 2019-01-01 LAB — BASIC METABOLIC PANEL
Anion gap: 9 (ref 5–15)
BUN: 16 mg/dL (ref 6–20)
CO2: 28 mmol/L (ref 22–32)
Calcium: 8.7 mg/dL — ABNORMAL LOW (ref 8.9–10.3)
Chloride: 100 mmol/L (ref 98–111)
Creatinine, Ser: 1.24 mg/dL — ABNORMAL HIGH (ref 0.44–1.00)
GFR calc Af Amer: 56 mL/min — ABNORMAL LOW (ref >60)
GFR calc non Af Amer: 49 mL/min — ABNORMAL LOW (ref >60)
Glucose, Bld: 116 mg/dL — ABNORMAL HIGH (ref 70–99)
Potassium: 3.7 mmol/L (ref 3.5–5.1)
Sodium: 137 mmol/L (ref 135–145)

## 2019-01-01 LAB — VANCOMYCIN, TROUGH: Vancomycin Tr: 16 ug/mL (ref 15–20)

## 2019-01-01 MED ORDER — CHLORHEXIDINE GLUCONATE 4 % EX LIQD: 60.0000 mL | Freq: Once | CUTANEOUS | Status: DC

## 2019-01-01 MED ORDER — CEFAZOLIN SODIUM-DEXTROSE 2-4 GM/100ML-% IV SOLN
2.0000 g | INTRAVENOUS | Status: DC
  Filled 2019-01-01: qty 100

## 2019-01-01 MED ORDER — VANCOMYCIN HCL IN DEXTROSE 1-5 GM/200ML-% IV SOLN
1000.0000 mg | INTRAVENOUS | Status: DC
  Administered 2019-01-01 – 2019-01-05 (×5): 1000 mg via INTRAVENOUS
  Filled 2019-01-01 (×5): qty 200

## 2019-01-01 NOTE — Progress Notes (Signed)
Pharmacy Antibiotic Note  Kari [name] is a 58 y.o. female admitted on 12/25/2018 with bilateral lower extremity  wound infection.  Pharmacy consulted for Vancomycin.  Vancomycin levels drawn yesterday and this morning demonstrate slightly supratherapeutic levels  Plan: Change Vancomycin 1 g IV q24h   Height: 5' 7.01" (170.2 cm) Weight: 255 lb (115.7 kg) IBW/kg (Calculated) : 61.62  Temp (24hrs), Avg:98.2 F (36.8 C), Min:97 F (36.1 C), Max:98.9 F (37.2 C)  Recent Labs  Lab 12/25/18 1521 12/25/18 1606 12/27/18 0243 12/28/18 0422 12/30/18 0451 12/31/18 0759 01/01/19 0304  WBC 20.0*  --  12.5* 8.4 8.1  --  13.8*  CREATININE 1.53*  --  1.26* 1.21* 1.41*  --   --   LATICACIDVEN  --  1.71  --   --   --   --   --   VANCOTROUGH  --   --   --   --   --   --  16  VANCOPEAK  --   --   --   --   --  43*  --     Estimated Creatinine Clearance: 58.5 mL/min (A) (by C-G formula based on SCr of 1.41 mg/dL (H)).    Allergies  Allergen Reactions  . Tramadol Other (See Comments)    hallucination   Vanc 1/3 >> Zosyn 1/3 >> 1/7 Cefepime 1/7>> Flagyl 1/7>>  Cultures: 1/2 Bcx: GPC results pending, BCID neg 1/3 MRSA PCR neg   Phillis Knack, PharmD, BCPS

## 2019-01-01 NOTE — Progress Notes (Addendum)
PROGRESS NOTE    Kari [name]  SWF:[id number] DOB: 10/30/1961 DOA: 12/25/2018 PCP: Kari Guys, MD   Brief Narrative: Patient  a 58 year old female with past medical history of chronic venous stasis ulcers on bilateral lower extremities, morbid obesity, congestive heart failure, sarcoidosis who was sent from home health wound care nurse secondary to worsening bilateral necrotic venous ulcerations on her legs.  The ulcers were reported to be foul-smelling and there was drainage also.  No report of fever or chills at home.  Patient was admitted for management of the wound infection.  Orthopedics consulted.  Started on broad-spectrum antibiotics.  Dr. Sharol Given started staged  limb salvage surgical interventions starting  12/31/18.  Repeat debridement tomorrow.  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  She was following with vascular surgery and wound care as an outpatient. Currently patient is not septic.  Hemodynamically stable.  No fever. improving leukocytosis.  Will follow blood cultures. NGTD. Continue broad-spectrum antibiotics.  Currently on vancomycin and cefepime. Followed by orthopedics.  Dr. Sharol Given started  staged  Limb salvage surgical interventions starting from 12/31/18. Plan for repeat debridement tomorrow with anticipation  of split-thickness skin graft next week on Wednesday. Currently both lower extremities wrapped with dressings.B/L wound vac.  Chronic combined systolic and diastolic heart failure: History of nonischemic  cardiomyopathy.  Left heart catheterization on 06/2017 showed normal coronary arteries, ejection fraction of 35 to 22%, combined systolic and diastolic heart failure.  She underwent ICD placement on 06/2017.  On Lasix at home.  Continue Coreg.  CKD stage III: Baseline creatinine around 1.3-1.4.  Currently kidney function on baseline.  Hypokalemia: Supplemented with potassium.    Chronic normocytic anemia: Most likely associated  with chronic medical conditions.  Iron studies did not suggest iron deficiency anemia.  Patient denies any change the color of her stool. negative FOBT.  If hemoglobin drops further, will transfuse her.  Patient has history of blood transfusion in the past.  Continue to monitor CBC.  Hypertension: Currently blood pressure stable.  Home medications resumed.  Sarcoidosis: Currently stable.          DVT prophylaxis: Lovenox Code Status: Full Family Communication: None present at the bedside.  Daughters have been updates daily. Disposition Plan: Home vs SNF after completion of procedures by Dr. Sharol Given   Consultants: Orthopedics  Procedures: None  Antimicrobials:  Vanc 1/3 >> Zosyn 1/3 >> 1/7 Cefepime 1/7>> Flagyl 1/7>>   Subjective: Patient seen and examined the bedside this morning.  Remains hemodynamically stable. She looked comfortable. Currently  pain looks well controlled. Awaiting OR tomorrow.  Objective: Vitals:   12/31/18 1645 12/31/18 2129 01/01/19 0102 01/01/19 0541  BP: (!) 142/90 121/80 113/65 121/74  Pulse: 77 89 81 77  Resp: 17 20 16 20   Temp:  98.4 F (36.9 C) 98.9 F (37.2 C) 98.4 F (36.9 C)  TempSrc:  Oral Oral Oral  SpO2: 100% 93% 94% 96%  Weight:      Height:        Intake/Output Summary (Last 24 hours) at 01/01/2019 1315 Last data  filed at 01/01/2019 0962 Gross per 24 hour  Intake 2410.45 ml  Output 1065 ml  Net 1345.45 ml   Filed Weights   12/25/18 1517 12/31/18 1248  Weight: 115.7 kg 115.7 kg     Examination:  General exam: Appears calm and comfortable ,Not in distress,obese HEENT:PERRL,Oral mucosa moist, Ear/Nose normal on gross exam Respiratory system: Bilateral equal air entry, normal vesicular breath sounds, no wheezes or crackles  Cardiovascular system: S1 & S2 heard, RRR. No JVD, murmurs, rubs, gallops or clicks. Pedal edema. Gastrointestinal system: Abdomen is nondistended, soft and nontender. No organomegaly or masses felt. Normal bowel sounds heard. Central nervous system: Alert and oriented. No focal neurological deficits. Extremities:Bilateral lower extremities wrapped with dressing.Bilateral wound vacs. Skin: Venous stasis changes, ulcers on bilateral lower extremities  MSK: Normal muscle bulk,tone ,power Psychiatry: Judgement and insight appear normal. Mood & affect appropriate.     Data Reviewed: I have personally reviewed following labs and imaging studies  CBC: Recent Labs  Lab 12/25/18 1521 12/27/18 0243 12/28/18 0422 12/30/18 0451 01/01/19 0304  WBC 20.0* 12.5* 8.4 8.1 13.8*  NEUTROABS 16.8*  --  5.4 5.3 10.8*  HGB 9.0* 7.0* 7.3* 8.2* 7.7*  HCT 30.9* 23.0* 25.3* 27.9* 26.3*  MCV 87.8 87.8 87.8 88.3 88.9  PLT 418* 352 331 396 836   Basic Metabolic Panel: Recent Labs  Lab 12/25/18 1521 12/27/18 0243 12/28/18 0422 12/30/18 0451 01/01/19 0304  NA 137 137 139 139 137  K 3.3* 2.9* 3.5 3.4* 3.7  CL 102 103 105 97* 100  CO2 24 27 27  32 28  GLUCOSE 124* 113* 124* 98 116*  BUN 18 15 13 12 16   CREATININE 1.53* 1.26* 1.21* 1.41* 1.24*  CALCIUM 8.9 8.1* 8.4* 8.8* 8.7*  MG  --  2.0  --   --   --    GFR: Estimated Creatinine Clearance: 66.5 mL/min (A) (by C-G formula based on SCr of 1.24 mg/dL (H)). Liver Function Tests: Recent Labs  Lab 12/25/18 1521  AST 11*  ALT 7  ALKPHOS 65  BILITOT 0.6  PROT 8.9*  ALBUMIN 2.2*   No results for input(s): LIPASE, AMYLASE in the last 168 hours. No results for input(s): AMMONIA in the last 168  hours. Coagulation Profile: No results for input(s): INR, PROTIME in the last 168 hours. Cardiac Enzymes: No results for input(s): CKTOTAL, CKMB, CKMBINDEX, TROPONINI in the last 168 hours. BNP (last 3 results) No results for input(s): PROBNP in the last 8760 hours. HbA1C: No results for input(s): HGBA1C in the last 72 hours. CBG: No results for input(s): GLUCAP in the last 168 hours. Lipid Profile: No results for input(s): CHOL, HDL, LDLCALC, TRIG, CHOLHDL, LDLDIRECT in the last 72 hours. Thyroid Function Tests: No results for input(s): TSH, T4TOTAL, FREET4, T3FREE, THYROIDAB in the last 72 hours. Anemia Panel: No results for input(s): VITAMINB12, FOLATE, FERRITIN, TIBC, IRON, RETICCTPCT in the last 72 hours. Sepsis Labs: Recent Labs  Lab 12/25/18 1606  LATICACIDVEN 1.71    Recent Results (from the past 240 hour(s))  Culture, blood (Routine x 2)     Status: Abnormal   Collection Time: 12/25/18  3:45 PM  Result Value Ref Range Status   Specimen Description BLOOD RIGHT HAND  Final   Special Requests   Final    BOTTLES DRAWN AEROBIC ONLY Blood Culture adequate volume   Culture  Setup Time   Final    GRAM POSITIVE COCCI AEROBIC BOTTLE ONLY Organism ID to follow CRITICAL RESULT CALLED TO,  READ BACK BY AND VERIFIED WITH: Dion Body [facility] 12/29/18 1942 JDW    Culture (A)  Final    MICROCOCCUS SPECIES Standardized susceptibility testing for this organism is not available. Performed at [facility], [place] [address] [place], [place] [address]    Report Status 12/31/2018 FINAL  Final  Blood Culture ID Panel (Reflexed)     Status: None   Collection Time: 12/25/18  3:45 PM  Result Value Ref Range Status   Enterococcus species NOT DETECTED NOT DETECTED Final    Comment: CRITICAL RESULT CALLED TO, READ BACK BY AND VERIFIED WITH: J MILLEN PHARMD 12/29/18 1942 JDW    Listeria monocytogenes NOT DETECTED NOT DETECTED Final   Staphylococcus species NOT DETECTED NOT DETECTED Final    Staphylococcus aureus (BCID) NOT DETECTED NOT DETECTED Final   Streptococcus species NOT DETECTED NOT DETECTED Final   Streptococcus agalactiae NOT DETECTED NOT DETECTED Final   Streptococcus pneumoniae NOT DETECTED NOT DETECTED Final   Streptococcus pyogenes NOT DETECTED NOT DETECTED Final   Acinetobacter baumannii NOT DETECTED NOT DETECTED Final   Enterobacteriaceae species NOT DETECTED NOT DETECTED Final   Enterobacter cloacae complex NOT DETECTED NOT DETECTED Final   Escherichia coli NOT DETECTED NOT DETECTED Final   Klebsiella oxytoca NOT DETECTED NOT DETECTED Final   Klebsiella pneumoniae NOT DETECTED NOT DETECTED Final   Proteus species NOT DETECTED NOT DETECTED Final   Serratia marcescens NOT DETECTED NOT DETECTED Final   Haemophilus influenzae NOT DETECTED NOT DETECTED Final   Neisseria meningitidis NOT DETECTED NOT DETECTED Final   Pseudomonas aeruginosa NOT DETECTED NOT DETECTED Final   Candida albicans NOT DETECTED NOT DETECTED Final   Candida glabrata NOT DETECTED NOT DETECTED Final   Candida krusei NOT DETECTED NOT DETECTED Final   Candida parapsilosis NOT DETECTED NOT DETECTED Final   Candida tropicalis NOT DETECTED NOT DETECTED Final    Comment: Performed at [facility], [place]. [address], [place], [place] [address]  Culture, blood (Routine x 2)     Status: None   Collection Time: 12/25/18  3:51 PM  Result Value Ref Range Status   Specimen Description BLOOD RIGHT ANTECUBITAL  Final   Special Requests   Final    BOTTLES DRAWN AEROBIC AND ANAEROBIC Blood Culture adequate volume   Culture   Final    NO GROWTH 5 DAYS Performed at [facility], [place] [address], [place], [place] [address]    Report Status 12/30/2018 FINAL  Final  MRSA PCR Screening     Status: None   Collection Time: 12/26/18  7:01 AM  Result Value Ref Range Status   MRSA by PCR NEGATIVE NEGATIVE Final    Comment:        The GeneXpert MRSA Assay (FDA approved for NASAL specimens only), is  one component of a comprehensive MRSA colonization surveillance program. It is not intended to diagnose MRSA infection nor to guide or monitor treatment for MRSA infections. Performed at [facility], [place] [address], [place], [place] [address]   Surgical pcr screen     Status: None   Collection Time: 12/30/18  7:01 PM  Result Value Ref Range Status   MRSA, PCR NEGATIVE NEGATIVE Final   Staphylococcus aureus NEGATIVE NEGATIVE Final    Comment: (NOTE) The Xpert SA Assay (FDA approved for NASAL specimens in patients 23 years of age and older), is one component of a comprehensive surveillance program. It is not intended to diagnose infection nor to guide  or monitor treatment. Performed at [facility], [place] [address], [place], [place] [address]          Radiology Studies: No results found.      Scheduled Meds: . carvedilol  6.25 mg Oral BID  . docusate sodium  100 mg Oral Daily  . enoxaparin (LOVENOX) injection  40 mg Subcutaneous Q24H  . febuxostat  40 mg Oral Daily  . furosemide  60 mg Oral Daily  . sodium chloride flush  3 mL Intravenous Q12H   Continuous Infusions: . sodium chloride    . ceFEPime (MAXIPIME) IV 200 mL/hr at 01/01/19 9295  . lactated ringers 10 mL/hr at 12/31/18 1306  . metronidazole 500 mg (01/01/19 1058)  . vancomycin 1,000 mg (01/01/19 0628)     LOS: 6 days    Time spent:25 mins. More than 50% of that time was spent in counseling and/or coordination of care.      Shelly Coss, MD [facility] Pager [phone]  If 7PM-7AM, please contact night-coverage [email or web address] Password [email or web address] 01/01/2019, 1:15 PM

## 2019-01-02 ENCOUNTER — Encounter (HOSPITAL_COMMUNITY)
Admission: EM | Disposition: A | Discharge status: Home Health | Payer: Self-pay | Source: Home / Self Care | Attending: Internal Medicine

## 2019-01-02 ENCOUNTER — Encounter (HOSPITAL_COMMUNITY): Payer: Self-pay | Admitting: Certified Registered Nurse Anesthetist

## 2019-01-02 ENCOUNTER — Inpatient Hospital Stay (HOSPITAL_COMMUNITY): Payer: Medicare Other | Admitting: Certified Registered Nurse Anesthetist

## 2019-01-02 DIAGNOSIS — I5042 Chronic combined systolic (congestive) and diastolic (congestive) heart failure: Secondary | ICD-10-CM

## 2019-01-02 DIAGNOSIS — I10 Essential (primary) hypertension: Secondary | ICD-10-CM

## 2019-01-02 DIAGNOSIS — I83002 Varicose veins of unspecified lower extremity with ulcer of calf: Secondary | ICD-10-CM

## 2019-01-02 DIAGNOSIS — L97209 Non-pressure chronic ulcer of unspecified calf with unspecified severity: Secondary | ICD-10-CM

## 2019-01-02 DIAGNOSIS — T148XXA Other injury of unspecified body region, initial encounter: Secondary | ICD-10-CM

## 2019-01-02 DIAGNOSIS — D869 Sarcoidosis, unspecified: Secondary | ICD-10-CM

## 2019-01-02 HISTORY — PX: I&D EXTREMITY: SHX5045

## 2019-01-02 LAB — CBC WITH DIFFERENTIAL/PLATELET
Abs Immature Granulocytes: 0.08 10*3/uL — ABNORMAL HIGH (ref 0.00–0.07)
Basophils Absolute: 0.1 10*3/uL (ref 0.0–0.1)
Basophils Relative: 1 %
Eosinophils Absolute: 0.3 10*3/uL (ref 0.0–0.5)
Eosinophils Relative: 4 %
HCT: 25.3 % — ABNORMAL LOW (ref 36.0–46.0)
Hemoglobin: 7.2 g/dL — ABNORMAL LOW (ref 12.0–15.0)
Immature Granulocytes: 1 %
Lymphocytes Relative: 20 %
Lymphs Abs: 1.8 10*3/uL (ref 0.7–4.0)
MCH: 25.3 pg — ABNORMAL LOW (ref 26.0–34.0)
MCHC: 28.5 g/dL — ABNORMAL LOW (ref 30.0–36.0)
MCV: 88.8 fL (ref 80.0–100.0)
Monocytes Absolute: 0.9 10*3/uL (ref 0.1–1.0)
Monocytes Relative: 10 %
Neutro Abs: 6 10*3/uL (ref 1.7–7.7)
Neutrophils Relative %: 64 %
Platelets: 310 10*3/uL (ref 150–400)
RBC: 2.85 MIL/uL — ABNORMAL LOW (ref 3.87–5.11)
RDW: 16.8 % — ABNORMAL HIGH (ref 11.5–15.5)
WBC: 9.2 10*3/uL (ref 4.0–10.5)
nRBC: 0 % (ref 0.0–0.2)

## 2019-01-02 LAB — BASIC METABOLIC PANEL
Anion gap: 8 (ref 5–15)
BUN: 18 mg/dL (ref 6–20)
CO2: 31 mmol/L (ref 22–32)
Calcium: 8.7 mg/dL — ABNORMAL LOW (ref 8.9–10.3)
Chloride: 99 mmol/L (ref 98–111)
Creatinine, Ser: 1.32 mg/dL — ABNORMAL HIGH (ref 0.44–1.00)
GFR calc Af Amer: 52 mL/min — ABNORMAL LOW (ref >60)
GFR calc non Af Amer: 45 mL/min — ABNORMAL LOW (ref >60)
Glucose, Bld: 102 mg/dL — ABNORMAL HIGH (ref 70–99)
Potassium: 3 mmol/L — ABNORMAL LOW (ref 3.5–5.1)
Sodium: 138 mmol/L (ref 135–145)

## 2019-01-02 LAB — GLUCOSE, CAPILLARY: Glucose-Capillary: 105 mg/dL — ABNORMAL HIGH (ref 70–99)

## 2019-01-02 LAB — MAGNESIUM: Magnesium: 2.3 mg/dL (ref 1.7–2.4)

## 2019-01-02 SURGERY — IRRIGATION AND DEBRIDEMENT EXTREMITY
Anesthesia: General | Site: Leg Lower | Laterality: Bilateral

## 2019-01-02 MED ORDER — MIDAZOLAM HCL 5 MG/5ML IJ SOLN
INTRAMUSCULAR | Status: DC | PRN
  Administered 2019-01-02: 1 mg via INTRAVENOUS

## 2019-01-02 MED ORDER — FENTANYL CITRATE (PF) 100 MCG/2ML IJ SOLN
INTRAMUSCULAR | Status: AC
  Administered 2019-01-02: 25 ug via INTRAVENOUS
  Filled 2019-01-02: qty 2

## 2019-01-02 MED ORDER — POTASSIUM CHLORIDE 20 MEQ PO PACK
40.0000 meq | PACK | ORAL | Status: AC
  Administered 2019-01-02 (×2): 40 meq via ORAL
  Filled 2019-01-02 (×2): qty 2

## 2019-01-02 MED ORDER — SODIUM CHLORIDE 0.9 % IR SOLN
Status: DC | PRN
  Administered 2019-01-02: 3000 mL

## 2019-01-02 MED ORDER — LIDOCAINE 2% (20 MG/ML) 5 ML SYRINGE
INTRAMUSCULAR | Status: DC | PRN
  Administered 2019-01-02: 60 mg via INTRAVENOUS

## 2019-01-02 MED ORDER — 0.9 % SODIUM CHLORIDE (POUR BTL) OPTIME
TOPICAL | Status: DC | PRN
  Administered 2019-01-02: 1000 mL

## 2019-01-02 MED ORDER — ONDANSETRON HCL 4 MG/2ML IJ SOLN
INTRAMUSCULAR | Status: DC | PRN
  Administered 2019-01-02: 4 mg via INTRAVENOUS

## 2019-01-02 MED ORDER — ONDANSETRON HCL 4 MG/2ML IJ SOLN
INTRAMUSCULAR | Status: AC
  Filled 2019-01-02: qty 2

## 2019-01-02 MED ORDER — FENTANYL CITRATE (PF) 250 MCG/5ML IJ SOLN
INTRAMUSCULAR | Status: AC
  Filled 2019-01-02: qty 5

## 2019-01-02 MED ORDER — FENTANYL CITRATE (PF) 100 MCG/2ML IJ SOLN
25.0000 ug | INTRAMUSCULAR | Status: DC | PRN
  Administered 2019-01-02 (×3): 25 ug via INTRAVENOUS

## 2019-01-02 MED ORDER — LIDOCAINE 2% (20 MG/ML) 5 ML SYRINGE
INTRAMUSCULAR | Status: AC
  Filled 2019-01-02: qty 5

## 2019-01-02 MED ORDER — ONDANSETRON HCL 4 MG/2ML IJ SOLN: 4.0000 mg | Freq: Once | INTRAMUSCULAR | Status: DC | PRN

## 2019-01-02 MED ORDER — FENTANYL CITRATE (PF) 100 MCG/2ML IJ SOLN
INTRAMUSCULAR | Status: DC | PRN
  Administered 2019-01-02: 50 ug via INTRAVENOUS
  Administered 2019-01-02 (×2): 25 ug via INTRAVENOUS
  Administered 2019-01-02 (×3): 50 ug via INTRAVENOUS

## 2019-01-02 MED ORDER — PROPOFOL 10 MG/ML IV BOLUS
INTRAVENOUS | Status: AC
  Filled 2019-01-02: qty 40

## 2019-01-02 MED ORDER — MIDAZOLAM HCL 2 MG/2ML IJ SOLN
INTRAMUSCULAR | Status: AC
  Filled 2019-01-02: qty 2

## 2019-01-02 MED ORDER — PROPOFOL 10 MG/ML IV BOLUS
INTRAVENOUS | Status: DC | PRN
  Administered 2019-01-02: 200 mg via INTRAVENOUS

## 2019-01-02 SURGICAL SUPPLY — 34 items
BLADE SURG 21 STRL SS (BLADE) ×3 IMPLANT
BNDG COHESIVE 6X5 TAN STRL LF (GAUZE/BANDAGES/DRESSINGS) ×4 IMPLANT
BNDG GAUZE ELAST 4 BULKY (GAUZE/BANDAGES/DRESSINGS) ×6 IMPLANT
COVER SURGICAL LIGHT HANDLE (MISCELLANEOUS) ×6 IMPLANT
COVER WAND RF STERILE (DRAPES) ×3 IMPLANT
DRAPE INCISE IOBAN 66X45 STRL (DRAPES) ×4 IMPLANT
DRAPE U-SHAPE 47X51 STRL (DRAPES) ×3 IMPLANT
DRESSING HYDROCOLLOID 4X4 (GAUZE/BANDAGES/DRESSINGS) ×8 IMPLANT
DRESSING VERAFLO CLEANSE CC (GAUZE/BANDAGES/DRESSINGS) IMPLANT
DRSG ADAPTIC 3X8 NADH LF (GAUZE/BANDAGES/DRESSINGS) ×3 IMPLANT
DRSG VERAFLO CLEANSE CC (GAUZE/BANDAGES/DRESSINGS) ×9
ELECT REM PT RETURN 9FT ADLT (ELECTROSURGICAL) ×3
ELECTRODE REM PT RTRN 9FT ADLT (ELECTROSURGICAL) IMPLANT
GAUZE SPONGE 4X4 12PLY STRL (GAUZE/BANDAGES/DRESSINGS) ×3 IMPLANT
GLOVE BIOGEL PI IND STRL 9 (GLOVE) ×1 IMPLANT
GLOVE BIOGEL PI INDICATOR 9 (GLOVE) ×2
GLOVE SURG ORTHO 9.0 STRL STRW (GLOVE) ×3 IMPLANT
GOWN STRL REUS W/ TWL XL LVL3 (GOWN DISPOSABLE) ×2 IMPLANT
GOWN STRL REUS W/TWL XL LVL3 (GOWN DISPOSABLE) ×4
HANDPIECE INTERPULSE COAX TIP (DISPOSABLE) ×2
KIT BASIN OR (CUSTOM PROCEDURE TRAY) ×3 IMPLANT
KIT TURNOVER KIT B (KITS) ×3 IMPLANT
MANIFOLD NEPTUNE II (INSTRUMENTS) ×3 IMPLANT
NS IRRIG 1000ML POUR BTL (IV SOLUTION) ×3 IMPLANT
PACK ORTHO EXTREMITY (CUSTOM PROCEDURE TRAY) ×3 IMPLANT
PAD ARMBOARD 7.5X6 YLW CONV (MISCELLANEOUS) ×6 IMPLANT
PAD NEG PRESSURE SENSATRAC (MISCELLANEOUS) ×4 IMPLANT
SET HNDPC FAN SPRY TIP SCT (DISPOSABLE) IMPLANT
STOCKINETTE IMPERVIOUS 9X36 MD (GAUZE/BANDAGES/DRESSINGS) ×2 IMPLANT
SWAB COLLECTION DEVICE MRSA (MISCELLANEOUS) ×3 IMPLANT
TOWEL OR 17X26 10 PK STRL BLUE (TOWEL DISPOSABLE) ×3 IMPLANT
TUBE CONNECTING 12'X1/4 (SUCTIONS) ×1
TUBE CONNECTING 12X1/4 (SUCTIONS) ×2 IMPLANT
YANKAUER SUCT BULB TIP NO VENT (SUCTIONS) ×3 IMPLANT

## 2019-01-02 NOTE — Anesthesia Preprocedure Evaluation (Signed)
Anesthesia Evaluation  Patient identified by MRN, date of birth, ID band Patient awake    Reviewed: Allergy & Precautions, NPO status , Patient's Chart, lab work & pertinent test results  History of Anesthesia Complications Negative for: history of anesthetic complications  Airway Mallampati: IV  TM Distance: >3 FB Neck ROM: Full    Dental  (+) Dental Advisory Given   Pulmonary asthma ,   Sarcoidosis    breath sounds clear to auscultation       Cardiovascular hypertension, Pt. on home beta blockers and Pt. on medications + Peripheral Vascular Disease and +CHF  + Cardiac Defibrillator  Rhythm:Regular Rate:Normal   '18 Cath - Left dominant coronary anatomy. Normal coronary arteries. Mild to moderate global left ventricular hypo-kinesis with estimated EF 35-40%. LV end-diastolic pressure is up and normal. Combined systolic and diastolic heart failure, chronic  '18 TTE - Diffuse hypokinesis worse in the inferior basal   Wall. LV cavity size was moderately dilated. Moderate LVH. EF 40% to 45%. Mild AI, moderate MR. LA was moderately dilated.    Neuro/Psych  Headaches, PSYCHIATRIC DISORDERS Depression    GI/Hepatic negative GI ROS, Neg liver ROS,   Endo/Other  Morbid obesity  Renal/GU Renal InsufficiencyRenal disease     Musculoskeletal negative musculoskeletal ROS (+)   Abdominal   Peds  Hematology negative hematology ROS (+) anemia ,   Anesthesia Other Findings   Reproductive/Obstetrics                             Lab Results  Component Value Date   WBC 9.2 01/02/2019   HGB 7.2 (L) 01/02/2019   HCT 25.3 (L) 01/02/2019   MCV 88.8 01/02/2019   PLT 310 01/02/2019   Lab Results  Component Value Date   CREATININE 1.32 (H) 01/02/2019   BUN 18 01/02/2019   NA 138 01/02/2019   K 3.0 (L) 01/02/2019   CL 99 01/02/2019   CO2 31 01/02/2019    Anesthesia Physical  Anesthesia  Plan  ASA: III  Anesthesia Plan: General   Post-op Pain Management:    Induction: Intravenous  PONV Risk Score and Plan: 3 and Treatment may vary due to age or medical condition, Ondansetron and Dexamethasone  Airway Management Planned: LMA  Additional Equipment: None  Intra-op Plan:   Post-operative Plan: Extubation in OR  Informed Consent: I have reviewed the patients History and Physical, chart, labs and discussed the procedure including the risks, benefits and alternatives for the proposed anesthesia with the patient or authorized representative who has indicated his/her understanding and acceptance.   Dental advisory given  Plan Discussed with: CRNA and Anesthesiologist  Anesthesia Plan Comments:         Anesthesia Quick Evaluation

## 2019-01-02 NOTE — Transfer of Care (Signed)
Immediate Anesthesia Transfer of Care Note  Patient: Kari [name]  Procedure(s) Performed: REPEAT DEBRIDEMENT BILATERAL LEGS, APPLY VAC X 2 (Bilateral Leg Lower)  Patient Location: PACU  Anesthesia Type:General  Level of Consciousness: awake, alert  and oriented  Airway & Oxygen Therapy: Patient Spontanous Breathing and Patient connected to face mask oxygen  Post-op Assessment: Report given to RN and Post -op Vital signs reviewed and stable  Post vital signs: Reviewed and stable  Last Vitals:  Vitals Value Taken Time  BP 128/83 01/02/2019 11:12 AM  Temp    Pulse 51 01/02/2019 11:13 AM  Resp 12 01/02/2019 11:13 AM  SpO2 100 % 01/02/2019 11:13 AM  Vitals shown include unvalidated device data.  Last Pain:  Vitals:   01/02/19 0945  TempSrc: Oral  PainSc:       Patients Stated Pain Goal: 3 (36/85/99 2341)  Complications: No apparent anesthesia complications

## 2019-01-02 NOTE — Anesthesia Postprocedure Evaluation (Signed)
Anesthesia Post Note  Patient: Kari [name]  Procedure(s) Performed: REPEAT DEBRIDEMENT BILATERAL LEGS, APPLY VAC X 2 (Bilateral Leg Lower)     Patient location during evaluation: PACU Anesthesia Type: General Level of consciousness: awake and alert Pain management: pain level controlled Vital Signs Assessment: post-procedure vital signs reviewed and stable Respiratory status: spontaneous breathing, nonlabored ventilation, respiratory function stable and patient connected to nasal cannula oxygen Cardiovascular status: blood pressure returned to baseline and stable Postop Assessment: no apparent nausea or vomiting Anesthetic complications: no    Last Vitals:  Vitals:   01/02/19 1141 01/02/19 1207  BP: 129/75 117/76  Pulse: 76 74  Resp: 17 18  Temp: (!) 36.3 C 36.6 C  SpO2: 92% 97%    Last Pain:  Vitals:   01/02/19 1251  TempSrc:   PainSc: 4                  Tiajuana Amass

## 2019-01-02 NOTE — Op Note (Signed)
01/02/2019  11:15 AM  PATIENT:  Kari [name]    PRE-OPERATIVE DIAGNOSIS:  Venous Stasis Insufficiency Ulceration Bilateral Legs  POST-OPERATIVE DIAGNOSIS:  Same  PROCEDURE:  REPEAT DEBRIDEMENT BILATERAL LEGS With excision skin and soft tissue muscle and fascia., APPLY VAC X 2 each greater than 50 cm.  SURGEON:  [name], MD  PHYSICIAN ASSISTANT:None ANESTHESIA:   General  PREOPERATIVE INDICATIONS:  Kari [name] is a  58 y.o. female with a diagnosis of Venous Stasis Insufficiency Ulceration Bilateral Legs who failed conservative measures and elected for surgical management.    The risks benefits and alternatives were discussed with the patient preoperatively including but not limited to the risks of infection, bleeding, nerve injury, cardiopulmonary complications, the need for revision surgery, among others, and the patient was willing to proceed.  OPERATIVE IMPLANTS: Cleanse choice wound VAC sponges x4  @ENCIMAGES @  OPERATIVE FINDINGS: Left leg wound 10 x 15 cm right leg wound 17 x 15 cm  OPERATIVE PROCEDURE: Patient was brought the operating room and underwent a general anesthetic.  After adequate levels anesthesia were obtained patient's both lower extremities were prepped using Betadine paint and draped into a sterile field a timeout was called.  A 21 blade knife was used to debride the skin and soft tissue muscle and fascia from both leg wounds.  The wounds are now flush with the skin excellent granulation tissue has developed.  There is no exposed bone or tendon no necrotic tissue.  After debridement the wounds were irrigated with pulsatile lavage.  The reticulated foam was then applied to the wound.  The wound was windowpane with the hydrocolloid to prevent maceration around the wound edges.  The reticulated foam was covered by the solid foam this was covered by [place] and both legs had a good suction fit there was no drainage at the time of leaving the operating room.   Patient was extubated taken the PACU in stable condition.   DISCHARGE PLANNING:  Antibiotic duration: Continue IV antibiotics  Weightbearing: Weightbearing as tolerated  Pain medication: Continue opioid pathway  Dressing care/ Wound VAC: Continue wound vacs for both lower extremities  Ambulatory devices: Walker.  Discharge to: Plan for return to the operating room on Wednesday for split-thickness skin grafts at which time portable wound vacs will be applied the patient could be discharged to home she does have family to care for at home.  Follow-up: In the office 1 week post operative.

## 2019-01-02 NOTE — Progress Notes (Signed)
Day shift staff please remember to provide information regarding patient's ICD when reporting to OR nurse for surgery later today 01/02/2019.  Thank you.

## 2019-01-02 NOTE — Interval H&P Note (Signed)
History and Physical Interval Note:  01/02/2019 7:05 AM  [place]  has presented today for surgery, with the diagnosis of Venous Stasis Insufficiency Ulceration Bilateral Legs  The various methods of treatment have been discussed with the patient and family. After consideration of risks, benefits and other options for treatment, the patient has consented to  Procedure(s): REPEAT DEBRIDEMENT BILATERAL LEGS, APPLY VAC X 2 (Bilateral) as a surgical intervention .  The patient's history has been reviewed, patient examined, no change in status, stable for surgery.  I have reviewed the patient's chart and labs.  Questions were answered to the patient's satisfaction.     [place]  [phone]

## 2019-01-02 NOTE — Progress Notes (Signed)
Attending Kari [name] note  Patient was seen, examined,treatment plan was discussed with the PA-S.  I have personally reviewed the clinical findings, lab, imaging studies and management of this patient in detail. I agree with the documentation, as recorded by the PA-S  Kari [name] is a 58 y.o. 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 Sharol Given to attempt limb salvage surgically.  Physical: Vitals:   01/02/19 1141 01/02/19 1207  BP: 129/75 117/76  Pulse: 76 74  Resp: 17 18  Temp: (!) 97.3 F (36.3 C) 97.9 F (36.6 C)  SpO2: 92% 97%    Constitutional:normal appearing female Eyes: EOMI, anicteric, normal conjunctivae ENMT: Oropharynx with moist mucous membranes,  Neck: FROM Cardiovascular: RRR systolic murmur, with no peripheral edema Respiratory: Normal respiratory effort, clear breath sounds  Abdomen: Soft,non-tender,  Skin: bilateral lower legs in compression wraps and wound vac draining appropriately.   Neurologic: Grossly no focal neuro deficit. Psychiatric:Appropriate affect, and mood. Mental status AAOx3  Plan  1. Purulent cellulitis of bilateral lower extremities due to chronic venous stasis ulcerations.  Remains hemodynamically stable without signs of sepsis on empiric IV vancomycin, IV Flagyl, IV cefepime.  1 of 2 blood cultures on 1/2 growing micrococcus species.  [place] is chronic venous stasis insufficiency ulcerations of both legs.  2. Chronic venous stasis insufficiency with ulceration, bilateral legs.  Patient underwent second debridement of legs (1/10)  in attempt for limb salvage intervention by Dr. Sharol Given with orthopedics.  We will continue pain management, Dr. Sharol Given plans to take to the OR on 01/07/2023 split thickness skin grafts.  Will assess at that time if patient able to be discharged home.  Continue wound vacs and pain control for now  3. CHF with reduced ejection fraction (40-45%, 06/2017, ICD placement).  Patient remains euvolemic on exam.  Continue home Coreg,  Lasix.  Monitor volume status closely.  4. CKD stage III.  Creatinine remained stable at baseline (range creatinine 1.3-1.4)  5. Hypokalemia.  Magnesium within normal limits.  Will replete orally and monitor BMP  6. Chronic normocytic anemia.  Most likely anemia of chronic disease related to CKD?  Has not required blood transfusion during hospital stay.  Previous baseline 8-9 since hospitalization hemoglobin stable at 7, likely drop due to surgeries.  Will closely monitor, goal hemoglobin greater than 8 given cardiac issues but want to be careful with volume status will closely monitor  Rest as below  Kari [name]    PROGRESS NOTE  Kari [name] BJY:[id number] DOB: 1961-06-07 DOA: 12/25/2018 PCP: Kari [name], Kari [name]  HPI/Brief Narrative  Kari [name] is a 58 y.o. year old female with medical history significant for chronic venous stasis ulcers on BLE, morbid obesity, CHF with defibrillator placed 06/2017, and sarcoidosis who presented on 12/25/2018 from home health with worsening bilateral venous ulcerations on her BLE and was admitted for management of the wound infection. Patient states home health nurse noticed increased foul smelling drainage from BLE and brought her into the hospital.   Hospital Course: After being admitted for chronic venous stasis ulcer wound management, pt has been followed by ortho,  who initiated limb salvage surgical interventions 12/31/18 with debridement. She is scheduled for another debridement today. She was started on IV  Vancomycin and Cefepime.   Subjective Patient is scheduled for repeat debridement of BLE today. She states the pain due to her ulcers today is about 6-7/10, which is improved from when she was initially admitted. She states she was able to get up and walk yesterday. She denies any fever or chills. She denies any nausea, vomiting, or abdominal pain. She denies any CP or SOB.   Assessment/Plan:  #1 Cellulitis of BLE and Chronic Venous Stasis Ulcers of BLE- Patient is hemodynamically stable. Her leukocytosis has resolved today and she is afebrile. On exam, bandages on BLE are clean and dry. Dr. Sharol Given with orthopedics started staged Limb salvage surgical interventions on 12/31/18. She continues to be followed by orthopedics and is scheduled to undergo repeat debridement today. Plan is also for split-thickness skin graft next week on Wednesday.   #2 Chronic systolic and diastolic HF-  Stable. LHC 06/2017 showed EF of 35-40%, normal coronary arteries, and combined systolic and diastolic HF. Defibrillator placed 06/2017. Continue home Coreg. Patient also takes Lasix at home.  #3 Stage III CKD- Stable. Patient remains at baseline Cr between 1.3-1.4. Creatinine was 1.32 today.   #4 Hypokalemia- Patient's K has dropped to 3 today. Was 3.7 yesterday. Ordered magnesium level for further evaluation. Will continue to order daily BMP for evaluation.   #5 Chronic Normocytic Anemia- Patient's HgB is slighter lower today at 7.2. Was 7.7 yesterday. Patient has had transfusions in the past. Will continue to monitor CBC to evaluate need for possible transfusion.   #6 HTN- BP is stable today. Continue home Coreg.   #7 Sarcoidosis- Stable.   Cultures:  none  Telemetry: Patient is on Telemetry   DVT prophylaxis: Lovenox Consultants:  Orthopedics following patient    Procedures:  Patient scheduled for wound debridement with ortho today  Antimicrobials: Patient on IV Vancomycin and Cefepime  Code Status:  FULL  Family Communication: Patient's sister in room and updated on plan.    Disposition Plan:    Anticipate Discharge home to home health after ortho procedures completed with Dr. Sharol Given.   Objective: Vitals:   01/02/19 0439 01/02/19 0945 01/02/19 1021 01/02/19 1115  BP: 114/60 110/65  128/83  Pulse: 79 72  71  Resp: 18 17  14   Temp: 98.6 F (37 C) 98.8 F (37.1 C)  (P) 98.1 F (36.7 C)  TempSrc: Oral Oral    SpO2: 98% 99%  100%  Weight:   115 kg   Height:   5\' 7"  (1.702 m)     Intake/Output Summary (Last 24 hours) at 01/02/2019 1124 Last data filed at 01/02/2019 1100 Gross per 24 hour  Intake 1344.23 ml  Output 2695 ml  Net -1350.77 ml   Filed Weights   12/25/18 1517 12/31/18 1248 01/02/19 1021  Weight: 115.7 kg 115.7 kg 115 kg    Exam: Constitutional:normal appearing female.  Eyes: EOMI, anicteric, normal conjunctivae ENMT: Oropharynx with moist mucous membranes Neck: FROM Cardiovascular: RRR no MRGs, with no peripheral edema Respiratory: Normal respiratory effort, clear breath sounds  Abdomen: Soft,non-tender, with no HSM Skin: No rash. Bandages in place on bilateral lower extremities are clean and dry covering venous stasis chronic ulcers.  Neurologic: Grossly no focal neuro deficit. Psychiatric:Appropriate affect, and mood. Mental status AAOx3  Data Reviewed: CBC: Recent Labs  Lab 12/27/18 0243 12/28/18 0422 12/30/18 0451 01/01/19 0304 01/02/19 0541  WBC  12.5* 8.4 8.1 13.8* 9.2  NEUTROABS  --  5.4 5.3 10.8* 6.0  HGB 7.0* 7.3* 8.2* 7.7* 7.2*  HCT 23.0* 25.3* 27.9* 26.3* 25.3*  MCV 87.8 87.8 88.3 88.9 88.8  PLT 352 331 396 391 725   Basic Metabolic Panel: Recent Labs  Lab 12/27/18 0243 12/28/18 0422 12/30/18 0451 01/01/19 0304 01/02/19 0541  NA 137 139 139 137 138  K 2.9* 3.5 3.4* 3.7 3.0*  CL 103 105 97* 100 99  CO2 27 27 32 28 31  GLUCOSE 113* 124* 98 116* 102*  BUN 15 13 12 16 18   CREATININE 1.26* 1.21* 1.41* 1.24* 1.32*  CALCIUM 8.1*  8.4* 8.8* 8.7* 8.7*  MG 2.0  --   --   --   --    GFR: Estimated Creatinine Clearance: 62.4 mL/min (A) (by C-G formula based on SCr of 1.32 mg/dL (H)). Liver Function Tests: No results for input(s): AST, ALT, ALKPHOS, BILITOT, PROT, ALBUMIN in the last 168 hours. No results for input(s): LIPASE, AMYLASE in the last 168 hours. No results for input(s): AMMONIA in the last 168 hours. Coagulation Profile: No results for input(s): INR, PROTIME in the last 168 hours. Cardiac Enzymes: No results for input(s): CKTOTAL, CKMB, CKMBINDEX, TROPONINI in the last 168 hours. BNP (last 3 results) No results for input(s): PROBNP in the last 8760 hours. HbA1C: No results for input(s): HGBA1C in the last 72 hours. CBG: Recent Labs  Lab 01/02/19 1118  GLUCAP 105*   Lipid Profile: No results for input(s): CHOL, HDL, LDLCALC, TRIG, CHOLHDL, LDLDIRECT in the last 72 hours. Thyroid Function Tests: No results for input(s): TSH, T4TOTAL, FREET4, T3FREE, THYROIDAB in the last 72 hours. Anemia Panel: No results for input(s): VITAMINB12, FOLATE, FERRITIN, TIBC, IRON, RETICCTPCT in the last 72 hours. Urine analysis:    Component Value Date/Time   COLORURINE YELLOW 02/20/2018 2357   APPEARANCEUR HAZY (A) 02/20/2018 2357   LABSPEC 1.015 02/20/2018 2357   PHURINE 5.0 02/20/2018 2357   GLUCOSEU NEGATIVE 02/20/2018 2357   HGBUR SMALL (A) 02/20/2018 2357   BILIRUBINUR NEGATIVE 02/20/2018 2357   KETONESUR NEGATIVE 02/20/2018 2357   PROTEINUR NEGATIVE 02/20/2018 2357   UROBILINOGEN 0.2 04/10/2014 0949   NITRITE NEGATIVE 02/20/2018 2357   LEUKOCYTESUR LARGE (A) 02/20/2018 2357   Sepsis Labs: @LABRCNTIP (procalcitonin:4,lacticidven:4)  ) Recent Results (from the past 240 hour(s))  Culture, blood (Routine x 2)     Status: Abnormal   Collection Time: 12/25/18  3:45 PM  Result Value Ref Range Status   Specimen Description BLOOD RIGHT HAND  Final   Special Requests   Final    BOTTLES DRAWN AEROBIC ONLY  Blood Culture adequate volume   Culture  Setup Time   Final    GRAM POSITIVE COCCI AEROBIC BOTTLE ONLY Organism ID to follow CRITICAL RESULT CALLED TO, READ BACK BY AND VERIFIED WITH: Kari [name] 12/29/18 1942 JDW    Culture (A)  Final    MICROCOCCUS SPECIES Standardized susceptibility testing for this organism is not available. Performed at [facility], [place] [address] [place], [place] [address]    Report Status 12/31/2018 FINAL  Final  Blood Culture ID Panel (Reflexed)     Status: None   Collection Time: 12/25/18  3:45 PM  Result Value Ref Range Status   Enterococcus species NOT DETECTED NOT DETECTED Final    Comment: CRITICAL RESULT CALLED TO, READ BACK BY AND VERIFIED WITH: Kari [name] 12/29/18 1942 JDW    Listeria monocytogenes NOT  DETECTED NOT DETECTED Final   Staphylococcus species NOT DETECTED NOT DETECTED Final   Staphylococcus aureus (BCID) NOT DETECTED NOT DETECTED Final   Streptococcus species NOT DETECTED NOT DETECTED Final   Streptococcus agalactiae NOT DETECTED NOT DETECTED Final   Streptococcus pneumoniae NOT DETECTED NOT DETECTED Final   Streptococcus pyogenes NOT DETECTED NOT DETECTED Final   Acinetobacter baumannii NOT DETECTED NOT DETECTED Final   Enterobacteriaceae species NOT DETECTED NOT DETECTED Final   Enterobacter cloacae complex NOT DETECTED NOT DETECTED Final   Escherichia coli NOT DETECTED NOT DETECTED Final   Klebsiella oxytoca NOT DETECTED NOT DETECTED Final   Klebsiella pneumoniae NOT DETECTED NOT DETECTED Final   Proteus species NOT DETECTED NOT DETECTED Final   Serratia marcescens NOT DETECTED NOT DETECTED Final   Haemophilus influenzae NOT DETECTED NOT DETECTED Final   Neisseria meningitidis NOT DETECTED NOT DETECTED Final   Pseudomonas aeruginosa NOT DETECTED NOT DETECTED Final   Candida albicans NOT DETECTED NOT DETECTED Final   Candida glabrata NOT DETECTED NOT DETECTED Final   Candida krusei NOT DETECTED NOT DETECTED Final    Candida parapsilosis NOT DETECTED NOT DETECTED Final   Candida tropicalis NOT DETECTED NOT DETECTED Final    Comment: Performed at [facility], [place] [address], [place], [place] [address]  Culture, blood (Routine x 2)     Status: None   Collection Time: 12/25/18  3:51 PM  Result Value Ref Range Status   Specimen Description BLOOD RIGHT ANTECUBITAL  Final   Special Requests   Final    BOTTLES DRAWN AEROBIC AND ANAEROBIC Blood Culture adequate volume   Culture   Final    NO GROWTH 5 DAYS Performed at [facility], [place] [address], [place], [place] [address]    Report Status 12/30/2018 FINAL  Final  MRSA PCR Screening     Status: None   Collection Time: 12/26/18  7:01 AM  Result Value Ref Range Status   MRSA by PCR NEGATIVE NEGATIVE Final    Comment:        The GeneXpert MRSA Assay (FDA approved for NASAL specimens only), is one component of a comprehensive MRSA colonization surveillance program. It is not intended to diagnose MRSA infection nor to guide or monitor treatment for MRSA infections. Performed at [facility], [place] [address] [place], [place] [address]   Surgical pcr screen     Status: None   Collection Time: 12/30/18  7:01 PM  Result Value Ref Range Status   MRSA, PCR NEGATIVE NEGATIVE Final   Staphylococcus aureus NEGATIVE NEGATIVE Final    Comment: (NOTE) The Xpert SA Assay (FDA approved for NASAL specimens in patients 26 years of age and older), is one component of a comprehensive surveillance program. It is not intended to diagnose infection nor to guide or monitor treatment. Performed at [facility], [place] [address] [place], [place] [address]       Studies: No results found.  Scheduled Meds: . [MAR Hold] carvedilol  6.25 mg Oral BID  . [MAR Hold] docusate sodium  100 mg Oral Daily  . [MAR Hold] enoxaparin (LOVENOX) injection  40 mg Subcutaneous Q24H  . [MAR Hold] febuxostat  40 mg Oral Daily  . fentaNYL      . [MAR Hold]  furosemide  60 mg Oral Daily  . [MAR Hold] sodium chloride flush  3 mL Intravenous Q12H    Continuous Infusions: . [MAR Hold] sodium chloride    . [name] Hold]  ceFEPime (MAXIPIME) IV 2 g (01/02/19 4268)  . lactated ringers 10 mL/hr at 12/31/18 1306  . [MAR Hold] metronidazole 500 mg (01/02/19 0941)  . [MAR Hold] vancomycin 1,000 mg (01/02/19 0706)     LOS: 7 days     Kari Mussel, PA-S  01/02/2019, 11:24 AM

## 2019-01-02 NOTE — Anesthesia Procedure Notes (Signed)
Procedure Name: LMA Insertion Date/Time: 01/02/2019 10:38 AM Performed by: Alain Marion, CRNA Pre-anesthesia Checklist: Patient identified, Emergency Drugs available, Suction available and Patient being monitored Patient Re-evaluated:Patient Re-evaluated prior to induction Oxygen Delivery Method: Circle System Utilized Preoxygenation: Pre-oxygenation with 100% oxygen Induction Type: IV induction Ventilation: Mask ventilation without difficulty LMA: LMA inserted LMA Size: 4.0 Number of attempts: 1 Airway Equipment and Method: Bite block Placement Confirmation: positive ETCO2 Tube secured with: Tape Dental Injury: Teeth and Oropharynx as per pre-operative assessment

## 2019-01-03 ENCOUNTER — Encounter (HOSPITAL_COMMUNITY): Payer: Self-pay | Admitting: Orthopedic Surgery

## 2019-01-03 LAB — CBC
HCT: 24.8 % — ABNORMAL LOW (ref 36.0–46.0)
Hemoglobin: 7.4 g/dL — ABNORMAL LOW (ref 12.0–15.0)
MCH: 27.1 pg (ref 26.0–34.0)
MCHC: 29.8 g/dL — ABNORMAL LOW (ref 30.0–36.0)
MCV: 90.8 fL (ref 80.0–100.0)
Platelets: 312 10*3/uL (ref 150–400)
RBC: 2.73 MIL/uL — ABNORMAL LOW (ref 3.87–5.11)
RDW: 17.5 % — ABNORMAL HIGH (ref 11.5–15.5)
WBC: 10.9 10*3/uL — ABNORMAL HIGH (ref 4.0–10.5)
nRBC: 0 % (ref 0.0–0.2)

## 2019-01-03 LAB — BASIC METABOLIC PANEL
Anion gap: 9 (ref 5–15)
BUN: 18 mg/dL (ref 6–20)
CO2: 29 mmol/L (ref 22–32)
Calcium: 8.7 mg/dL — ABNORMAL LOW (ref 8.9–10.3)
Chloride: 101 mmol/L (ref 98–111)
Creatinine, Ser: 1.4 mg/dL — ABNORMAL HIGH (ref 0.44–1.00)
GFR calc Af Amer: 49 mL/min — ABNORMAL LOW (ref >60)
GFR calc non Af Amer: 42 mL/min — ABNORMAL LOW (ref >60)
Glucose, Bld: 116 mg/dL — ABNORMAL HIGH (ref 70–99)
Potassium: 3.4 mmol/L — ABNORMAL LOW (ref 3.5–5.1)
Sodium: 139 mmol/L (ref 135–145)

## 2019-01-03 NOTE — Progress Notes (Signed)
D/c pt and husband d/c reviewed/ instructions and also incision care and pain magagment .  Pain 2/10. VSS breathing regular and unlabored incisions CDI.  Pt and husband demonstrates understanding with teach back

## 2019-01-03 NOTE — Progress Notes (Signed)
PROGRESS NOTE  Kari [name]:[id number] DOB: 1961/03/13 DOA: 12/25/2018 PCP: Rutherford Guys, MD  HPI/Brief Narrative   Kari [name] is a 58 y.o. 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 Sharol Given to attempt limb salvage surgically.    Subjective Doing well no complaints NO CP, SOB, abd pain Having BMS  Assessment/Plan:   Purulent cellulitis of bilateral lower extremities due to chronic venous stasis ulcerations.  Remains hemodynamically stable without signs of sepsis on empiric IV vancomycin, IV Flagyl, IV cefepime.  1 of 2 blood cultures on 1/2 growing micrococcus species.  Nidus is chronic venous stasis insufficiency ulcerations of both legs.  Chronic venous stasis insufficiency with ulceration, bilateral legs.  Patient underwent second debridement of legs (1/10) in attempt for limb salvage intervention by Dr. Sharol Given with orthopedics.  We will continue pain management, Dr. Sharol Given plans to take to the OR on 01/07/2023 split thickness skin grafts.  Will assess at that time if patient able to be discharged home.  Continue wound vacs and pain control for now  CHF with reduced ejection fraction (40-45%, 06/2017, ICD placement).  Patient remains euvolemic on exam.  Continue home Coreg,  Lasix.  Monitor volume status closely.  CKD stage III.  Creatinine remained stable at baseline (range creatinine 1.3-1.4)  Hypokalemia, improving but still low.  Magnesium within normal limits.  Will replete orally and monitor BMP  Chronic normocytic anemia, stable.  Most likely  anemia of chronic disease related to CKD?  Has not required blood transfusion during hospital stay.  Previous baseline 8-9 since hospitalization hemoglobin stable at 7, likely drop due to surgeries.  Will closely monitor, goal hemoglobin greater than 8 given cardiac issues but want to be careful with volume status given reduced EF will closely monitor   Sarcoidosis- Stable.   Cultures:  none  Telemetry: Patient is on Telemetry   DVT prophylaxis: Lovenox Consultants:  Orthopedics following patient    Procedures:  Patient scheduled for wound debridement with ortho today  Antimicrobials: Patient on IV Vancomycin and Cefepime  Code Status: FULL  Family Communication: family updated at bedside.    Disposition Plan:    Anticipate Discharge home to home health after ortho procedures completed with Dr. Sharol Given.   Objective: Vitals:   01/02/19 1207 01/02/19 2030 01/03/19 0132 01/03/19 0544  BP: 117/76 (!) 97/59 98/61 (!) 101/57  Pulse: 74 77 74 69  Resp: 18 18 16 16   Temp: 97.9 F (36.6 C) 98.7 F (37.1 C) 98.6 F (37 C) 98.7 F (37.1 C)  TempSrc: Oral  Oral Oral  SpO2: 97% 99% 96% 94%  Weight:      Height:        Intake/Output Summary (Last 24 hours) at 01/03/2019 0831 Last data filed at 01/03/2019 0600 Gross per 24 hour  Intake 2437.11 ml  Output 1645 ml  Net 792.11 ml   Filed Weights   12/25/18 1517 12/31/18 1248 01/02/19 1021  Weight: 115.7 kg 115.7 kg 115 kg    Exam: Constitutional:normal appearing female Eyes: EOMI, anicteric, normal conjunctivae ENMT: Oropharynx with moist mucous membranes,  Neck: FROM Cardiovascular: RRR systolic  murmur, with no peripheral edema Respiratory: Normal respiratory effort, clear breath sounds  Abdomen: Soft,non-tender,  Skin: bilateral lower legs in compression wraps and wound vac draining appropriately.   Neurologic: Grossly no focal neuro deficit. Psychiatric:Appropriate affect, and mood. Mental status  Data  Reviewed: CBC: Recent Labs  Lab 12/28/18 0422 12/30/18 0451 01/01/19 0304 01/02/19 0541 01/03/19 0556  WBC 8.4 8.1 13.8* 9.2 10.9*  NEUTROABS 5.4 5.3 10.8* 6.0  --   HGB 7.3* 8.2* 7.7* 7.2* 7.4*  HCT 25.3* 27.9* 26.3* 25.3* 24.8*  MCV 87.8 88.3 88.9 88.8 90.8  PLT 331 396 391 310 749   Basic Metabolic Panel: Recent Labs  Lab 12/28/18 0422 12/30/18 0451 01/01/19 0304 01/02/19 0541 01/03/19 0556  NA 139 139 137 138 139  K 3.5 3.4* 3.7 3.0* 3.4*  CL 105 97* 100 99 101  CO2 27 32 28 31 29   GLUCOSE 124* 98 116* 102* 116*  BUN 13 12 16 18 18   CREATININE 1.21* 1.41* 1.24* 1.32* 1.40*  CALCIUM 8.4* 8.8* 8.7* 8.7* 8.7*  MG  --   --   --  2.3  --    GFR: Estimated Creatinine Clearance: 58.8 mL/min (A) (by C-G formula based on SCr of 1.4 mg/dL (H)). Liver Function Tests: No results for input(s): AST, ALT, ALKPHOS, BILITOT, PROT, ALBUMIN in the last 168 hours. No results for input(s): LIPASE, AMYLASE in the last 168 hours. No results for input(s): AMMONIA in the last 168 hours. Coagulation Profile: No results for input(s): INR, PROTIME in the last 168 hours. Cardiac Enzymes: No results for input(s): CKTOTAL, CKMB, CKMBINDEX, TROPONINI in the last 168 hours. BNP (last 3 results) No results for input(s): PROBNP in the last 8760 hours. HbA1C: No results for input(s): HGBA1C in the last 72 hours. CBG: Recent Labs  Lab 01/02/19 1118  GLUCAP 105*   Lipid Profile: No results for input(s): CHOL, HDL, LDLCALC, TRIG, CHOLHDL, LDLDIRECT in the last 72 hours. Thyroid Function Tests: No results for input(s): TSH, T4TOTAL, FREET4, T3FREE, THYROIDAB in the last 72 hours. Anemia Panel: No results for input(s): VITAMINB12, FOLATE, FERRITIN, TIBC, IRON, RETICCTPCT in the last 72 hours. Urine analysis:    Component Value Date/Time   COLORURINE YELLOW 02/20/2018 2357   APPEARANCEUR HAZY (A) 02/20/2018 2357   LABSPEC 1.015 02/20/2018 2357   PHURINE 5.0 02/20/2018 2357   GLUCOSEU  NEGATIVE 02/20/2018 2357   HGBUR SMALL (A) 02/20/2018 2357   BILIRUBINUR NEGATIVE 02/20/2018 2357   KETONESUR NEGATIVE 02/20/2018 2357   PROTEINUR NEGATIVE 02/20/2018 2357   UROBILINOGEN 0.2 04/10/2014 0949   NITRITE NEGATIVE 02/20/2018 2357   LEUKOCYTESUR LARGE (A) 02/20/2018 2357   Sepsis Labs: @LABRCNTIP (procalcitonin:4,lacticidven:4)  ) Recent Results (from the past 240 hour(s))  Culture, blood (Routine x 2)     Status: Abnormal   Collection Time: 12/25/18  3:45 PM  Result Value Ref Range Status   Specimen Description BLOOD RIGHT HAND  Final   Special Requests   Final    BOTTLES DRAWN AEROBIC ONLY Blood Culture adequate volume   Culture  Setup Time   Final    GRAM POSITIVE COCCI AEROBIC BOTTLE ONLY Organism ID to follow CRITICAL RESULT CALLED TO, READ BACK BY AND VERIFIED WITH: [facility] 12/29/18 1942 JDW    Culture (A)  Final    MICROCOCCUS SPECIES Standardized susceptibility testing for this organism is not available. Performed at [facility], [place] [address], [place], [place] [address]    Report Status 12/31/2018 FINAL  Final  Blood Culture ID Panel (Reflexed)     Status: None   Collection Time: 12/25/18  3:45 PM  Result Value Ref Range Status   Enterococcus species NOT DETECTED NOT DETECTED Final    Comment: CRITICAL RESULT CALLED TO, READ BACK BY AND VERIFIED WITH: J MILLEN PHARMD 12/29/18 1942 JDW    Listeria monocytogenes NOT DETECTED NOT DETECTED Final   Staphylococcus species NOT DETECTED NOT DETECTED Final   Staphylococcus aureus (BCID) NOT DETECTED NOT DETECTED Final   Streptococcus species NOT DETECTED NOT DETECTED Final   Streptococcus agalactiae NOT DETECTED NOT DETECTED Final   Streptococcus pneumoniae NOT DETECTED NOT DETECTED Final   Streptococcus pyogenes NOT DETECTED NOT DETECTED Final   Acinetobacter baumannii NOT DETECTED NOT DETECTED Final   Enterobacteriaceae species NOT DETECTED NOT DETECTED Final   Enterobacter cloacae complex NOT  DETECTED NOT DETECTED Final   Escherichia coli NOT DETECTED NOT DETECTED Final   Klebsiella oxytoca NOT DETECTED NOT DETECTED Final   Klebsiella pneumoniae NOT DETECTED NOT DETECTED Final   Proteus species NOT DETECTED NOT DETECTED Final   Serratia marcescens NOT DETECTED NOT DETECTED Final   Haemophilus influenzae NOT DETECTED NOT DETECTED Final   Neisseria meningitidis NOT DETECTED NOT DETECTED Final   Pseudomonas aeruginosa NOT DETECTED NOT DETECTED Final   Candida albicans NOT DETECTED NOT DETECTED Final   Candida glabrata NOT DETECTED NOT DETECTED Final   Candida krusei NOT DETECTED NOT DETECTED Final   Candida parapsilosis NOT DETECTED NOT DETECTED Final   Candida tropicalis NOT DETECTED NOT DETECTED Final    Comment: Performed at [facility], [place]. [address], [place], [place] [address]  Culture, blood (Routine x 2)     Status: None   Collection Time: 12/25/18  3:51 PM  Result Value Ref Range Status   Specimen Description BLOOD RIGHT ANTECUBITAL  Final   Special Requests   Final    BOTTLES DRAWN AEROBIC AND ANAEROBIC Blood Culture adequate volume   Culture   Final    NO GROWTH 5 DAYS Performed at [facility], [place] [address] [place], [place] [address]    Report Status 12/30/2018 FINAL  Final  MRSA PCR Screening     Status: None   Collection Time: 12/26/18  7:01 AM  Result Value Ref Range Status   MRSA by PCR NEGATIVE NEGATIVE Final    Comment:        The GeneXpert MRSA Assay (FDA approved for NASAL specimens only), is one component of a comprehensive MRSA colonization surveillance program. It is not intended to diagnose MRSA infection nor to guide or monitor treatment for MRSA infections. Performed at [facility], [place] [address] [place], [place] [address]   Surgical pcr screen     Status: None   Collection Time: 12/30/18  7:01 PM  Result Value Ref Range Status   MRSA, PCR NEGATIVE NEGATIVE Final   Staphylococcus aureus NEGATIVE NEGATIVE Final     Comment: (NOTE) The Xpert SA Assay (FDA approved for NASAL specimens in patients 77 years of age and older), is one component of a comprehensive surveillance program. It is not intended to diagnose infection nor to guide or monitor treatment. Performed at [facility], [place] [address], [place], [place] [address]       Studies: No results found.  Scheduled Meds: . carvedilol  6.25 mg Oral BID  . docusate sodium  100 mg Oral Daily  . enoxaparin (LOVENOX) injection  40 mg Subcutaneous  Q24H  . febuxostat  40 mg Oral Daily  . furosemide  60 mg Oral Daily  . sodium chloride flush  3 mL Intravenous Q12H    Continuous Infusions: . sodium chloride    . ceFEPime (MAXIPIME) IV 2 g (01/03/19 0548)  . lactated ringers 10 mL/hr at 12/31/18 1306  . metronidazole Stopped (01/03/19 0223)  . vancomycin 1,000 mg (01/03/19 0550)     LOS: 8 days     Desiree Hane, PA-S  01/03/2019, 8:31 AM

## 2019-01-04 LAB — CBC
HCT: 26 % — ABNORMAL LOW (ref 36.0–46.0)
Hemoglobin: 7.4 g/dL — ABNORMAL LOW (ref 12.0–15.0)
MCH: 26 pg (ref 26.0–34.0)
MCHC: 28.5 g/dL — ABNORMAL LOW (ref 30.0–36.0)
MCV: 91.2 fL (ref 80.0–100.0)
Platelets: 294 10*3/uL (ref 150–400)
RBC: 2.85 MIL/uL — ABNORMAL LOW (ref 3.87–5.11)
RDW: 18.4 % — ABNORMAL HIGH (ref 11.5–15.5)
WBC: 10.3 10*3/uL (ref 4.0–10.5)
nRBC: 0 % (ref 0.0–0.2)

## 2019-01-04 LAB — BASIC METABOLIC PANEL
Anion gap: 9 (ref 5–15)
BUN: 22 mg/dL — ABNORMAL HIGH (ref 6–20)
CO2: 29 mmol/L (ref 22–32)
Calcium: 8.8 mg/dL — ABNORMAL LOW (ref 8.9–10.3)
Chloride: 102 mmol/L (ref 98–111)
Creatinine, Ser: 1.57 mg/dL — ABNORMAL HIGH (ref 0.44–1.00)
GFR calc Af Amer: 42 mL/min — ABNORMAL LOW (ref >60)
GFR calc non Af Amer: 36 mL/min — ABNORMAL LOW (ref >60)
Glucose, Bld: 117 mg/dL — ABNORMAL HIGH (ref 70–99)
Potassium: 2.9 mmol/L — ABNORMAL LOW (ref 3.5–5.1)
Sodium: 140 mmol/L (ref 135–145)

## 2019-01-04 LAB — VANCOMYCIN, PEAK: Vancomycin Pk: 39 ug/mL (ref 30–40)

## 2019-01-04 MED ORDER — POTASSIUM CHLORIDE 20 MEQ/15ML (10%) PO SOLN
40.0000 meq | ORAL | Status: AC
  Administered 2019-01-04 (×2): 40 meq via ORAL
  Filled 2019-01-04 (×2): qty 30

## 2019-01-04 MED ORDER — FUROSEMIDE 40 MG PO TABS
40.0000 mg | ORAL_TABLET | Freq: Every day | ORAL | Status: DC
  Administered 2019-01-05: 40 mg via ORAL
  Filled 2019-01-04 (×2): qty 1

## 2019-01-04 NOTE — Progress Notes (Signed)
Pharmacy Antibiotic Note  Kari [name] is a 58 y.o. female admitted on 12/25/2018 with purulent cellulitis.  Pharmacy has been consulted for Vancomycin dosing.  D: D#10 for severe bilateral wound infxn (purulent cellulitis). Multiple debridements (last 1/10). Plan again 1/15 for skin grafts. Trying for salvage limb, s/p I&D and VAC placement on 1/8 Tmax 99.5, WBC 13.8>9.2>10.9, post-op, LA 1.71,  - Scr 1.41>1.24>1.32>1.4>1.57 fluctuating  Vanc 1/3 >> Zosyn 1/3 >> 1/7 Cefepime 1/7 >> Flagyl 1/7>>  1/8 VP/VT 43/16, AUC 579 on 1250mg  q24 >> 1g q24 (SCr 1.24)  1/2 BCx - 1 of 2 Micrococcus spp (BCID negative). Contaminant? 1/3 MRSA PCR - negative   Plan: Vanc 1000mg  IV Q24H. Rechecking peak(1/12) and trough 1/13 with increased Scr. Cefepime 2g IV Q8H  Flagyl 500mg  IV Q8H   Height: 5\' 7"  (170.2 cm) Weight: 253 lb 8.5 oz (115 kg) IBW/kg (Calculated) : 61.6  Temp (24hrs), Avg:98.7 F (37.1 C), Min:98.4 F (36.9 C), Max:99.5 F (37.5 C)  Recent Labs  Lab 12/30/18 0451 12/31/18 0759 01/01/19 0304 01/02/19 0541 01/03/19 0556 01/04/19 0348  WBC 8.1  --  13.8* 9.2 10.9*  --   CREATININE 1.41*  --  1.24* 1.32* 1.40* 1.57*  VANCOTROUGH  --   --  16  --   --   --   VANCOPEAK  --  43*  --   --   --   --     Estimated Creatinine Clearance: 52.4 mL/min (A) (by C-G formula based on SCr of 1.57 mg/dL (H)).    Allergies  Allergen Reactions  . Tramadol Other (See Comments)    hallucination    Ahmon Tosi S. Alford Highland, PharmD, BCPS Clinical Staff Pharmacist Eilene Ghazi Stillinger 01/04/2019 8:09 AM

## 2019-01-04 NOTE — Progress Notes (Signed)
PROGRESS NOTE  Kari [name] HFW:[id number] DOB: 10/10/1961 DOA: 12/25/2018 PCP: Rutherford Guys, MD  HPI/Brief Narrative   Kari [name] is a 58 y.o. 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 Sharol Given to attempt limb salvage surgically.    Subjective Continues to do well.  No acute complaints.  No acute events overnight  Assessment/Plan: Purulent cellulitis of bilateral lower extremities due to chronic venous stasis ulcerations, stable.  Remains hemodynamically stable without signs of sepsis on empiric IV vancomycin, IV Flagyl, IV cefepime.  1 of 2 blood cultures on 1/2 growing micrococcus species.  Nidus is chronic venous stasis insufficiency ulcerations of both legs.  Chronic venous stasis insufficiency with ulceration, bilateral legs.  Patient underwent second debridement of legs (1/10) in attempt for limb salvage intervention by Dr. Sharol Given with orthopedics.  We will continue pain management, Dr. Sharol Given plans to take to the OR on 01/07/2023 split thickness skin grafts.  Will assess at that time if patient able to be discharged home.  Continue wound vacs and pain control for now  CHF with reduced ejection fraction (40-45%, 06/2017, ICD placement).  Patient remains euvolemic on exam.  Continue home Coreg, will reduce home Lasix to 40 mg given persistent hyperkalemia and patient maintaining normal volume status, continue to watch closely, daily weights   CKD stage III.  Creatinine remained stable at baseline (range creatinine 1.3-1.4)  Hypokalemia, likely related to Lasix  therapy.  Improving but still low.  Magnesium within normal limits.  Will replete orally and monitor BMP  Chronic normocytic anemia, stable.  Most likely anemia of chronic disease related to CKD?  Has not required blood transfusion during hospital stay.  Previous baseline 8-9 since hospitalization hemoglobin stable at 7, likely drop due to surgeries.  Will closely monitor, goal hemoglobin greater than 8 given cardiac issues but want to be careful with volume status given reduced EF will closely monitor   Sarcoidosis- Stable.   Cultures:  none  Telemetry: Patient is on Telemetry   DVT prophylaxis: Lovenox Consultants:  Orthopedics following patient    Procedures:  Patient scheduled for wound debridement with ortho today  Antimicrobials: Patient on IV Vancomycin and Cefepime  Code Status: FULL  Family Communication: family updated at bedside.    Disposition Plan:    Anticipate Discharge home to home health after ortho procedures completed with Dr. Sharol Given.   Objective: Vitals:   01/03/19 2234 01/04/19 0226 01/04/19 0502 01/04/19 0503  BP: 104/60 103/60 (!) 98/59 (!) 98/59  Pulse: 75 71 72 71  Resp: 17 16  18   Temp: 98.5 F (36.9 C) 98.4 F (36.9 C) 98.6 F (37 C) 98.6 F (37 C)  TempSrc: Oral Oral Oral Oral  SpO2: 94% 97% 93% 94%  Weight:      Height:        Intake/Output Summary (Last 24 hours) at 01/04/2019 1433 Last data filed at 01/04/2019 0930 Gross per 24 hour  Intake 1915.22 ml  Output 2100 ml  Net -184.78 ml   Filed Weights   12/25/18 1517 12/31/18 1248 01/02/19 1021  Weight: 115.7 kg 115.7 kg 115 kg  Exam: Constitutional:normal appearing female Eyes: EOMI, anicteric, normal conjunctivae ENMT: Oropharynx with moist mucous membranes,  Neck: FROM Cardiovascular: RRR systolic murmur, with no peripheral edema Respiratory: Normal respiratory effort on room air, clear breath sounds  Abdomen: Soft,non-tender, normal bowel sounds Skin: bilateral lower  legs in compression wraps and wound vacs draining appropriately.   Neurologic: Grossly no focal neuro deficit. Psychiatric:Appropriate affect, and mood. Mental status  Data Reviewed: CBC: Recent Labs  Lab 12/30/18 0451 01/01/19 0304 01/02/19 0541 01/03/19 0556 01/04/19 0851  WBC 8.1 13.8* 9.2 10.9* 10.3  NEUTROABS 5.3 10.8* 6.0  --   --   HGB 8.2* 7.7* 7.2* 7.4* 7.4*  HCT 27.9* 26.3* 25.3* 24.8* 26.0*  MCV 88.3 88.9 88.8 90.8 91.2  PLT 396 391 310 312 157   Basic Metabolic Panel: Recent Labs  Lab 12/30/18 0451 01/01/19 0304 01/02/19 0541 01/03/19 0556 01/04/19 0348  NA 139 137 138 139 140  K 3.4* 3.7 3.0* 3.4* 2.9*  CL 97* 100 99 101 102  CO2 32 28 31 29 29   GLUCOSE 98 116* 102* 116* 117*  BUN 12 16 18 18  22*  CREATININE 1.41* 1.24* 1.32* 1.40* 1.57*  CALCIUM 8.8* 8.7* 8.7* 8.7* 8.8*  MG  --   --  2.3  --   --    GFR: Estimated Creatinine Clearance: 52.4 mL/min (A) (by C-G formula based on SCr of 1.57 mg/dL (H)). Liver Function Tests: No results for input(s): AST, ALT, ALKPHOS, BILITOT, PROT, ALBUMIN in the last 168 hours. No results for input(s): LIPASE, AMYLASE in the last 168 hours. No results for input(s): AMMONIA in the last 168 hours. Coagulation Profile: No results for input(s): INR, PROTIME in the last 168 hours. Cardiac Enzymes: No results for input(s): CKTOTAL, CKMB, CKMBINDEX, TROPONINI in the last 168 hours. BNP (last 3 results) No results for input(s): PROBNP in the last 8760 hours. HbA1C: No results for input(s): HGBA1C in the last 72 hours. CBG: Recent Labs  Lab 01/02/19 1118  GLUCAP 105*   Lipid Profile: No results for input(s): CHOL, HDL, LDLCALC, TRIG, CHOLHDL, LDLDIRECT in the last 72 hours. Thyroid Function Tests: No results for input(s): TSH, T4TOTAL, FREET4, T3FREE, THYROIDAB in the last 72 hours. Anemia Panel: No results for input(s): VITAMINB12, FOLATE, FERRITIN, TIBC, IRON, RETICCTPCT in the last 72 hours. Urine analysis:      Component Value Date/Time   COLORURINE YELLOW 02/20/2018 2357   APPEARANCEUR HAZY (A) 02/20/2018 2357   LABSPEC 1.015 02/20/2018 2357   PHURINE 5.0 02/20/2018 2357   GLUCOSEU NEGATIVE 02/20/2018 2357   HGBUR SMALL (A) 02/20/2018 2357   BILIRUBINUR NEGATIVE 02/20/2018 2357   KETONESUR NEGATIVE 02/20/2018 2357   PROTEINUR NEGATIVE 02/20/2018 2357   UROBILINOGEN 0.2 04/10/2014 0949   NITRITE NEGATIVE 02/20/2018 2357   LEUKOCYTESUR LARGE (A) 02/20/2018 2357   Sepsis Labs: @LABRCNTIP (procalcitonin:4,lacticidven:4)  ) Recent Results (from the past 240 hour(s))  Culture, blood (Routine x 2)     Status: Abnormal   Collection Time: 12/25/18  3:45 PM  Result Value Ref Range Status   Specimen Description BLOOD RIGHT HAND  Final   Special Requests   Final    BOTTLES DRAWN AEROBIC ONLY Blood Culture adequate volume   Culture  Setup Time   Final    GRAM POSITIVE COCCI AEROBIC BOTTLE ONLY Organism ID to follow CRITICAL RESULT CALLED TO, READ BACK BY AND VERIFIED WITH: [facility] 12/29/18 1942 JDW    Culture (A)  Final    MICROCOCCUS SPECIES Standardized  susceptibility testing for this organism is not available. Performed at [facility], [place] [address] [place], [place] [address]    Report Status 12/31/2018 FINAL  Final  Blood Culture ID Panel (Reflexed)     Status: None   Collection Time: 12/25/18  3:45 PM  Result Value Ref Range Status   Enterococcus species NOT DETECTED NOT DETECTED Final    Comment: CRITICAL RESULT CALLED TO, READ BACK BY AND VERIFIED WITH: J MILLEN PHARMD 12/29/18 1942 JDW    Listeria monocytogenes NOT DETECTED NOT DETECTED Final   Staphylococcus species NOT DETECTED NOT DETECTED Final   Staphylococcus aureus (BCID) NOT DETECTED NOT DETECTED Final   Streptococcus species NOT DETECTED NOT DETECTED Final   Streptococcus agalactiae NOT DETECTED NOT DETECTED Final   Streptococcus pneumoniae NOT DETECTED NOT DETECTED Final   Streptococcus pyogenes NOT  DETECTED NOT DETECTED Final   Acinetobacter baumannii NOT DETECTED NOT DETECTED Final   Enterobacteriaceae species NOT DETECTED NOT DETECTED Final   Enterobacter cloacae complex NOT DETECTED NOT DETECTED Final   Escherichia coli NOT DETECTED NOT DETECTED Final   Klebsiella oxytoca NOT DETECTED NOT DETECTED Final   Klebsiella pneumoniae NOT DETECTED NOT DETECTED Final   Proteus species NOT DETECTED NOT DETECTED Final   Serratia marcescens NOT DETECTED NOT DETECTED Final   Haemophilus influenzae NOT DETECTED NOT DETECTED Final   Neisseria meningitidis NOT DETECTED NOT DETECTED Final   Pseudomonas aeruginosa NOT DETECTED NOT DETECTED Final   Candida albicans NOT DETECTED NOT DETECTED Final   Candida glabrata NOT DETECTED NOT DETECTED Final   Candida krusei NOT DETECTED NOT DETECTED Final   Candida parapsilosis NOT DETECTED NOT DETECTED Final   Candida tropicalis NOT DETECTED NOT DETECTED Final    Comment: Performed at [facility], [place] [address] [place], [place] [address]  Culture, blood (Routine x 2)     Status: None   Collection Time: 12/25/18  3:51 PM  Result Value Ref Range Status   Specimen Description BLOOD RIGHT ANTECUBITAL  Final   Special Requests   Final    BOTTLES DRAWN AEROBIC AND ANAEROBIC Blood Culture adequate volume   Culture   Final    NO GROWTH 5 DAYS Performed at [facility], [place] [address], [place], [place] [address]    Report Status 12/30/2018 FINAL  Final  MRSA PCR Screening     Status: None   Collection Time: 12/26/18  7:01 AM  Result Value Ref Range Status   MRSA by PCR NEGATIVE NEGATIVE Final    Comment:        The GeneXpert MRSA Assay (FDA approved for NASAL specimens only), is one component of a comprehensive MRSA colonization surveillance program. It is not intended to diagnose MRSA infection nor to guide or monitor treatment for MRSA infections. Performed at [facility], [place] [address], [place], [place] [address]   Surgical  pcr screen     Status: None   Collection Time: 12/30/18  7:01 PM  Result Value Ref Range Status   MRSA, PCR NEGATIVE NEGATIVE Final   Staphylococcus aureus NEGATIVE NEGATIVE Final    Comment: (NOTE) The Xpert SA Assay (FDA approved for NASAL specimens in patients 6 years of age and older), is one component of a comprehensive surveillance program. It is not intended to diagnose infection nor to guide or monitor treatment. Performed at [facility], [place] [address], [place],  [address]       Studies: No results  found.  Scheduled Meds: . carvedilol  6.25 mg Oral BID  . docusate sodium  100 mg Oral Daily  . enoxaparin (LOVENOX) injection  40 mg Subcutaneous Q24H  . febuxostat  40 mg Oral Daily  . furosemide  60 mg Oral Daily  . sodium chloride flush  3 mL Intravenous Q12H    Continuous Infusions: . sodium chloride    . ceFEPime (MAXIPIME) IV 200 mL/hr at 01/04/19 0508  . lactated ringers 10 mL/hr at 12/31/18 1306  . metronidazole 500 mg (01/04/19 0929)  . vancomycin 1,000 mg (01/04/19 0508)     LOS: 9 days     Desiree Hane, PA-S  01/04/2019, 2:33 PM

## 2019-01-05 DIAGNOSIS — D638 Anemia in other chronic diseases classified elsewhere: Secondary | ICD-10-CM

## 2019-01-05 LAB — BASIC METABOLIC PANEL
Anion gap: 8 (ref 5–15)
BUN: 24 mg/dL — ABNORMAL HIGH (ref 6–20)
CO2: 29 mmol/L (ref 22–32)
Calcium: 8.9 mg/dL (ref 8.9–10.3)
Chloride: 103 mmol/L (ref 98–111)
Creatinine, Ser: 1.46 mg/dL — ABNORMAL HIGH (ref 0.44–1.00)
GFR calc Af Amer: 46 mL/min — ABNORMAL LOW (ref >60)
GFR calc non Af Amer: 40 mL/min — ABNORMAL LOW (ref >60)
Glucose, Bld: 115 mg/dL — ABNORMAL HIGH (ref 70–99)
Potassium: 3.1 mmol/L — ABNORMAL LOW (ref 3.5–5.1)
Sodium: 140 mmol/L (ref 135–145)

## 2019-01-05 LAB — CBC
HCT: 26.2 % — ABNORMAL LOW (ref 36.0–46.0)
Hemoglobin: 7.5 g/dL — ABNORMAL LOW (ref 12.0–15.0)
MCH: 26.4 pg (ref 26.0–34.0)
MCHC: 28.6 g/dL — ABNORMAL LOW (ref 30.0–36.0)
MCV: 92.3 fL (ref 80.0–100.0)
Platelets: 313 10*3/uL (ref 150–400)
RBC: 2.84 MIL/uL — ABNORMAL LOW (ref 3.87–5.11)
RDW: 19.1 % — ABNORMAL HIGH (ref 11.5–15.5)
WBC: 11 10*3/uL — ABNORMAL HIGH (ref 4.0–10.5)
nRBC: 0 % (ref 0.0–0.2)

## 2019-01-05 LAB — PREPARE RBC (CROSSMATCH)

## 2019-01-05 LAB — ABO/RH: ABO/RH(D): O POS

## 2019-01-05 LAB — MAGNESIUM: Magnesium: 2.4 mg/dL (ref 1.7–2.4)

## 2019-01-05 LAB — VANCOMYCIN, TROUGH: Vancomycin Tr: 19 ug/mL (ref 15–20)

## 2019-01-05 MED ORDER — VANCOMYCIN HCL IN DEXTROSE 1-5 GM/200ML-% IV SOLN
1000.0000 mg | INTRAVENOUS | Status: AC
  Administered 2019-01-06 – 2019-01-08 (×2): 1000 mg via INTRAVENOUS
  Filled 2019-01-05 (×2): qty 200

## 2019-01-05 MED ORDER — POTASSIUM CHLORIDE 20 MEQ PO PACK
40.0000 meq | PACK | Freq: Two times a day (BID) | ORAL | Status: AC
  Administered 2019-01-05 (×2): 40 meq via ORAL
  Filled 2019-01-05 (×4): qty 2

## 2019-01-05 MED ORDER — POLYETHYLENE GLYCOL 3350 17 G PO PACK
17.0000 g | PACK | Freq: Every day | ORAL | Status: DC
  Administered 2019-01-05 – 2019-01-08 (×3): 17 g via ORAL
  Filled 2019-01-05 (×3): qty 1

## 2019-01-05 MED ORDER — FUROSEMIDE 10 MG/ML IJ SOLN
20.0000 mg | Freq: Once | INTRAMUSCULAR | Status: AC
  Administered 2019-01-05: 20 mg via INTRAVENOUS
  Filled 2019-01-05: qty 2

## 2019-01-05 MED ORDER — SODIUM CHLORIDE 0.9% IV SOLUTION
Freq: Once | INTRAVENOUS | Status: AC
  Administered 2019-01-05: 17:00:00 via INTRAVENOUS

## 2019-01-05 MED ORDER — SENNOSIDES-DOCUSATE SODIUM 8.6-50 MG PO TABS
1.0000 | ORAL_TABLET | Freq: Two times a day (BID) | ORAL | Status: DC
  Administered 2019-01-05 – 2019-01-09 (×7): 1 via ORAL
  Filled 2019-01-05 (×8): qty 1

## 2019-01-05 NOTE — Progress Notes (Signed)
Pharmacy Antibiotic Note  Kari [name] is a 58 y.o. female admitted on 12/25/2018 with bilateral lower extremity wound infection.  Patient has back and forth to OR for I&D and wound vac changes. Vancomycin levels were drawn today which demonstrated an AUC of 708, which is supratherapeutic. SCr is slightly up from baseline, 1.2 > 1.4.  Vancomycin 1 mg IV Q 36 hrs. Goal AUC 400-550. Expected AUC: 472 SCr used: 1.4   Plan: Reduce vancomycin to 1 g IV q36h Cefepime 2g IV Q8H  Metronidazole 500mg  Q8H  Monitor clinical picture, renal function,vanc levels prn F/U C&S, abx deescalation / LOT   Height: 5\' 7"  (170.2 cm) Weight: 253 lb 8.5 oz (115 kg) IBW/kg (Calculated) : 61.6  Temp (24hrs), Avg:98.5 F (36.9 C), Min:98.3 F (36.8 C), Max:98.7 F (37.1 C)  Recent Labs  Lab 12/31/18 0759 01/01/19 0304 01/02/19 0541 01/03/19 0556 01/04/19 0348 01/04/19 0851 01/05/19 0517 01/05/19 0539  WBC  --  13.8* 9.2 10.9*  --  10.3 11.0*  --   CREATININE  --  1.24* 1.32* 1.40* 1.57*  --  1.46*  --   VANCOTROUGH  --  16  --   --   --   --   --  19  VANCOPEAK 43*  --   --   --   --  39  --   --      Vanc 1/3 >> Zosyn 1/3 >> 1/7 Cefepime 1/7>> Flagyl 1/7>>  Cultures: 1/2 Bcx: micrococcus 1/3 MRSA PCR neg   Harvel Quale 01/05/2019 10:20 AM

## 2019-01-05 NOTE — Consult Note (Signed)
   [facility] Inpatient Consult   01/05/2019  LARINA LIEURANCE January 08, 1961 [id number]  Patient screened for potential [place] [facility] services with a past HX with [facility] community follow up for transportation set up for [place] and [place]. . Patient is in the [place] of the [place] [facility] services under patient's Medicare plan. Chart reviewed and no disposition noted at this time. MD notes reveals RONISHA HERRINGSHAW is a 58 y.o. female with medical history significant of venous stasis ulcers, morbid obesity, congestive heart failure sent in from her home health wound care nurse secondary to worsening bilateral ulcerations to her legs.  Patient reports that it is been several days she is having foul smell and drainage from the woundsWill follow for progress.    Please place a [facility] Care Management consult or for questions contact:   Natividad Brood, RN BSN [facility] Liaison  [phone] business mobile phone Toll free office [phone]

## 2019-01-05 NOTE — Progress Notes (Signed)
Patient ID: Kari [name], female   DOB: 1961/09/22, 58 y.o.   MRN: [id number] Wound VAC functioning well good drainage.  We will plan for return to the operating room on Wednesday for application of split-thickness skin graft and reapplication of wound vacs.

## 2019-01-05 NOTE — Progress Notes (Signed)
PROGRESS NOTE  Kari [name] QPY:[id number] DOB: 27-Jul-1961 DOA: 12/25/2018 PCP: Rutherford Guys, MD  HPI/Brief Narrative   Kari [name] is a 58 y.o. 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 Sharol Given to attempt limb salvage surgically.  Subjective Continues to do well.  No acute complaints.  No acute events overnight  Assessment/Plan: Purulent cellulitis of bilateral lower extremities due to chronic venous stasis ulcerations, stable.  Remains hemodynamically stable without signs of sepsis on empiric IV vancomycin, IV Flagyl, IV cefepime.  1 of 2 blood cultures on 1/2 growing micrococcus species.  Nidus is chronic venous stasis insufficiency ulcerations of both legs.  Chronic venous stasis insufficiency with ulceration, bilateral legs.  Patient underwent second debridement of legs (1/10) in attempt for limb salvage intervention by Dr. Sharol Given with orthopedics.  We will continue pain management, Dr. Sharol Given plans to take to the OR on 01/07/2023 split thickness skin grafts, n.p.o. at midnight for that.  Will assess at that time if patient able to be discharged home.  Continue wound vacs and pain control for now  CHF with reduced ejection fraction (40-45%, 06/2017, ICD placement).  Patient remains euvolemic on exam.  Continue home Coreg, now on Lasix 40 mg (reduced from home dose given persistent hypokalemia) continue to watch closely, daily weights   CKD stage III.  Creatinine remained stable at baseline (range creatinine 1.3-1.4)  Hypokalemia, likely related to Lasix therapy.   Improving but still low, hesitant to discontinue Lasix given reduced EF and do not want patient to have worsening prior status.  We will again reassess magnesium, continue to monitor BMP, will replete orally.  Chronic normocytic anemia, stable.  Most likely anemia of chronic disease related to CKD?  Has not required blood transfusion during hospital stay.  Previous baseline 8-9 since hospitalization hemoglobin stable at 7, likely drop due to surgeries.  Will give blood today given goal hemoglobin of 8 (cardiac history), and to optimize prior to surgery on Wednesday.  Will closely monitor, want to be careful with volume status given reduced EF will closely monitor   Sarcoidosis- Stable.   Cultures:  none  Telemetry: Patient is on Telemetry   DVT prophylaxis: Lovenox Consultants:  Orthopedics following patient    Procedures:  Patient scheduled for wound debridement with ortho today  Antimicrobials: Patient on IV Vancomycin and Cefepime  Code Status: FULL  Family Communication: family updated at bedside.    Disposition Plan:   Plan for blood transfusion, need to continually replete and monitor potassium magnesium, extubated repeat surgery on 1/15 with Dr. Elby Showers Discharge home to home health after ortho procedures completed with Dr. Sharol Given.   Objective: Vitals:   01/04/19 1500 01/04/19 2146 01/05/19 0419 01/05/19 1326  BP: (!) 98/50 100/64 109/68 (!) 91/56  Pulse: 72 72 73 70  Resp: 18 18 17 18   Temp: 98.3 F (36.8 C) 98.7 F (37.1 C) 98.6 F (37 C) 97.9 F (36.6 C)  TempSrc: Oral Oral Oral Oral  SpO2: 99% 97% 96% 99%  Weight:      Height:        Intake/Output Summary (Last 24 hours)  at 01/05/2019 1402 Last data filed at 01/05/2019 1300 Gross per 24 hour  Intake 820 ml  Output 1350 ml  Net -530 ml   Filed Weights   12/25/18 1517 12/31/18 1248 01/02/19 1021  Weight: 115.7 kg 115.7 kg 115 kg    Exam: Constitutional:normal appearing female Eyes: EOMI,  anicteric, normal conjunctivae ENMT: Oropharynx with moist mucous membranes,  Neck: FROM Cardiovascular: RRR systolic murmur, with no peripheral edema Respiratory: Normal respiratory effort on room air, clear breath sounds  Abdomen: Soft,non-tender, normal bowel sounds Skin: bilateral lower legs in compression wraps and wound vacs draining appropriately.   Neurologic: Grossly no focal neuro deficit. Psychiatric:Appropriate affect, and mood. Mental status  Data Reviewed: CBC: Recent Labs  Lab 12/30/18 0451 01/01/19 0304 01/02/19 0541 01/03/19 0556 01/04/19 0851 01/05/19 0517  WBC 8.1 13.8* 9.2 10.9* 10.3 11.0*  NEUTROABS 5.3 10.8* 6.0  --   --   --   HGB 8.2* 7.7* 7.2* 7.4* 7.4* 7.5*  HCT 27.9* 26.3* 25.3* 24.8* 26.0* 26.2*  MCV 88.3 88.9 88.8 90.8 91.2 92.3  PLT 396 391 310 312 294 366   Basic Metabolic Panel: Recent Labs  Lab 01/01/19 0304 01/02/19 0541 01/03/19 0556 01/04/19 0348 01/05/19 0517  NA 137 138 139 140 140  K 3.7 3.0* 3.4* 2.9* 3.1*  CL 100 99 101 102 103  CO2 28 31 29 29 29   GLUCOSE 116* 102* 116* 117* 115*  BUN 16 18 18  22* 24*  CREATININE 1.24* 1.32* 1.40* 1.57* 1.46*  CALCIUM 8.7* 8.7* 8.7* 8.8* 8.9  MG  --  2.3  --   --   --    GFR: Estimated Creatinine Clearance: 56.4 mL/min (A) (by C-G formula based on SCr of 1.46 mg/dL (H)). Liver Function Tests: No results for input(s): AST, ALT, ALKPHOS, BILITOT, PROT, ALBUMIN in the last 168 hours. No results for input(s): LIPASE, AMYLASE in the last 168 hours. No results for input(s): AMMONIA in the last 168 hours. Coagulation Profile: No results for input(s): INR, PROTIME in the last 168 hours. Cardiac Enzymes: No results for input(s): CKTOTAL, CKMB, CKMBINDEX, TROPONINI in the last 168 hours. BNP (last 3 results) No results for input(s): PROBNP in the last 8760 hours. HbA1C: No results for input(s): HGBA1C in the last 72 hours. CBG: Recent Labs  Lab 01/02/19 1118  GLUCAP 105*   Lipid  Profile: No results for input(s): CHOL, HDL, LDLCALC, TRIG, CHOLHDL, LDLDIRECT in the last 72 hours. Thyroid Function Tests: No results for input(s): TSH, T4TOTAL, FREET4, T3FREE, THYROIDAB in the last 72 hours. Anemia Panel: No results for input(s): VITAMINB12, FOLATE, FERRITIN, TIBC, IRON, RETICCTPCT in the last 72 hours. Urine analysis:    Component Value Date/Time   COLORURINE YELLOW 02/20/2018 2357   APPEARANCEUR HAZY (A) 02/20/2018 2357   LABSPEC 1.015 02/20/2018 2357   PHURINE 5.0 02/20/2018 2357   GLUCOSEU NEGATIVE 02/20/2018 2357   HGBUR SMALL (A) 02/20/2018 2357   BILIRUBINUR NEGATIVE 02/20/2018 2357   KETONESUR NEGATIVE 02/20/2018 2357   PROTEINUR NEGATIVE 02/20/2018 2357   UROBILINOGEN 0.2 04/10/2014 0949   NITRITE NEGATIVE 02/20/2018 2357   LEUKOCYTESUR LARGE (A) 02/20/2018 2357   Sepsis Labs: @LABRCNTIP (procalcitonin:4,lacticidven:4)  ) Recent Results (from the past 240 hour(s))  Surgical pcr screen     Status: None   Collection Time: 12/30/18  7:01 PM  Result Value Ref Range Status   MRSA, PCR NEGATIVE NEGATIVE Final   Staphylococcus aureus NEGATIVE NEGATIVE Final    Comment: (NOTE) The Xpert SA Assay (  FDA approved for NASAL specimens in patients 31 years of age and older), is one component of a comprehensive surveillance program. It is not intended to diagnose infection nor to guide or monitor treatment. Performed at [facility], [place] [address] [place], [place] [address]       Studies: No results found.  Scheduled Meds: . carvedilol  6.25 mg Oral BID  . enoxaparin (LOVENOX) injection  40 mg Subcutaneous Q24H  . febuxostat  40 mg Oral Daily  . furosemide  40 mg Oral Daily  . polyethylene glycol  17 g Oral Daily  . senna-docusate  1 tablet Oral BID  . sodium chloride flush  3 mL Intravenous Q12H    Continuous Infusions: . sodium chloride    . ceFEPime (MAXIPIME) IV 2 g (01/04/19 2239)  . lactated ringers 10 mL/hr at 12/31/18 1306  .  metronidazole 500 mg (01/05/19 1028)  . [START ON 01/06/2019] vancomycin       LOS: 10 days     Desiree Hane, PA-S  01/05/2019, 2:02 PM

## 2019-01-05 NOTE — H&P (View-Only) (Signed)
Patient ID: Kari [name], female   DOB: 03-29-1961, 58 y.o.   MRN: [id number] Wound VAC functioning well good drainage.  We will plan for return to the operating room on Wednesday for application of split-thickness skin graft and reapplication of wound vacs.

## 2019-01-05 NOTE — Progress Notes (Signed)
   01/05/19 1326  Vitals  Temp 97.9 F (36.6 C)  Temp Source Oral  BP (!) 91/56  MAP (mmHg) 65  BP Location Right Arm  BP Method Automatic  Patient Position (if appropriate) Sitting  Pulse Rate 70  Resp 18  Oxygen Therapy  SpO2 99 %  O2 Device Room Air  MD Nettey paged and made aware

## 2019-01-06 LAB — CBC
HCT: 29.3 % — ABNORMAL LOW (ref 36.0–46.0)
Hemoglobin: 8.7 g/dL — ABNORMAL LOW (ref 12.0–15.0)
MCH: 27.2 pg (ref 26.0–34.0)
MCHC: 29.7 g/dL — ABNORMAL LOW (ref 30.0–36.0)
MCV: 91.6 fL (ref 80.0–100.0)
Platelets: 282 10*3/uL (ref 150–400)
RBC: 3.2 MIL/uL — ABNORMAL LOW (ref 3.87–5.11)
RDW: 18.7 % — ABNORMAL HIGH (ref 11.5–15.5)
WBC: 10.5 10*3/uL (ref 4.0–10.5)
nRBC: 0 % (ref 0.0–0.2)

## 2019-01-06 LAB — BASIC METABOLIC PANEL
Anion gap: 8 (ref 5–15)
Anion gap: 7 (ref 5–15)
BUN: 24 mg/dL — ABNORMAL HIGH (ref 6–20)
BUN: 23 mg/dL — ABNORMAL HIGH (ref 6–20)
CO2: 27 mmol/L (ref 22–32)
CO2: 26 mmol/L (ref 22–32)
Calcium: 8.8 mg/dL — ABNORMAL LOW (ref 8.9–10.3)
Calcium: 9.2 mg/dL (ref 8.9–10.3)
Chloride: 101 mmol/L (ref 98–111)
Chloride: 108 mmol/L (ref 98–111)
Creatinine, Ser: 1.33 mg/dL — ABNORMAL HIGH (ref 0.44–1.00)
Creatinine, Ser: 1.3 mg/dL — ABNORMAL HIGH (ref 0.44–1.00)
GFR calc Af Amer: 52 mL/min — ABNORMAL LOW (ref >60)
GFR calc Af Amer: 53 mL/min — ABNORMAL LOW (ref >60)
GFR calc non Af Amer: 45 mL/min — ABNORMAL LOW (ref >60)
GFR calc non Af Amer: 46 mL/min — ABNORMAL LOW (ref >60)
Glucose, Bld: 134 mg/dL — ABNORMAL HIGH (ref 70–99)
Glucose, Bld: 99 mg/dL (ref 70–99)
Potassium: 3 mmol/L — ABNORMAL LOW (ref 3.5–5.1)
Potassium: 4.8 mmol/L (ref 3.5–5.1)
Sodium: 136 mmol/L (ref 135–145)
Sodium: 141 mmol/L (ref 135–145)

## 2019-01-06 LAB — BPAM RBC
Blood Product Expiration Date: 202002112359
ISSUE DATE / TIME: 202001131655
Unit Type and Rh: 5100

## 2019-01-06 LAB — TYPE AND SCREEN
ABO/RH(D): O POS
Antibody Screen: NEGATIVE
Unit division: 0

## 2019-01-06 MED ORDER — POTASSIUM CHLORIDE 20 MEQ PO PACK
40.0000 meq | PACK | ORAL | Status: AC
  Administered 2019-01-06 (×3): 40 meq via ORAL
  Filled 2019-01-06 (×3): qty 2

## 2019-01-06 MED ORDER — CHLORHEXIDINE GLUCONATE 4 % EX LIQD
60.0000 mL | Freq: Once | CUTANEOUS | Status: AC
  Administered 2019-01-07: 4 via TOPICAL
  Filled 2019-01-06: qty 60

## 2019-01-06 MED ORDER — METRONIDAZOLE IN NACL 5-0.79 MG/ML-% IV SOLN
500.0000 mg | Freq: Three times a day (TID) | INTRAVENOUS | Status: AC
  Administered 2019-01-06 – 2019-01-08 (×9): 500 mg via INTRAVENOUS
  Filled 2019-01-06 (×9): qty 100

## 2019-01-06 NOTE — Progress Notes (Signed)
PROGRESS NOTE  Kari [name] OXB:[id number] DOB: June 05, 1961 DOA: 12/25/2018 PCP: Rutherford Guys, MD  HPI/Brief Narrative   Kari [name] is a 58 y.o. year old female with medical history significant for CHF with reduced ejection fraction status post ICD placement for secondary prevention ( history of resuscitated sudden death), hypertension, CKD stage III, morbid obesity, chronic venous stasis ulcers who presented on 12/25/2018 with several days of worsening drainage and foul smell from lower extremity leg ulcers and was found to have purulent cellulitis bilateral lower extremities related to ulcerations requiring empiric IV antibiotics as well as multiple debridements by Dr. Sharol Given to attempt limb salvage surgically.  Subjective Continues to do well.  No acute complaints.  No acute events overnight  Assessment/Plan: Purulent cellulitis of bilateral lower extremities due to chronic venous stasis ulcerations, stable.  Remains hemodynamically stable without signs of sepsis on empiric IV vancomycin, IV Flagyl, IV cefepime.  1 of 2 blood cultures on 1/2 growing micrococcus species.  Nidus is chronic venous stasis insufficiency ulcerations of both legs.  Chronic venous stasis insufficiency with ulceration, bilateral legs.  Patient underwent second debridement of legs (1/10) in attempt for limb salvage intervention by Dr. Sharol Given with orthopedics.  We will continue pain management, Dr. Sharol Given plans to take to the OR on 01/07/2023 split thickness skin grafts, n.p.o. at midnight for that.  Will assess at that time if patient able to be discharged home.  Continue wound vacs and pain control for now  CHF with reduced ejection fraction (40-45%, 06/2017, ICD placement for secondary prevention).  Patient remains euvolemic on exam.  Continue home Coreg, now on Lasix 40 mg (reduced from home dose given persistent hypokalemia) continue to watch closely, daily weights   CKD stage III.  Creatinine remained  stable at baseline (range creatinine 1.3-1.4)  Hypokalemia, likely related to Lasix therapy.  Low this am given 40 meq x3 and now wnl this afternoon, will repeat BMP in am before surgery, have discontinued lasix prior to procedure to ensure no further episodes of hypokalemia, will need to resume after procedure in order to stay on top of volume statuscontinue to monitor BMP.  Chronic normocytic anemia, stable.  Most likely anemia of chronic disease related to CKD? S/p 1 U transfusion on 1/13, hgb now 8.7 from 7.5( goal hgb > 8 due to cardiac issues. Monitor CBC    Sarcoidosis- Stable.   Cultures:  none  Telemetry: Patient is on Telemetry   DVT prophylaxis: Lovenox Consultants:  Orthopedics following patient    Procedures:  Patient scheduled for wound debridement with ortho today  Antimicrobials: Patient on IV Vancomycin and Cefepime  Code Status: FULL  Family Communication: family updated at bedside.    Disposition Plan:   NPO at midnight  repeat surgery/debridement on 1/15 with Dr. Elby Showers Discharge home to home health after ortho procedures completed with Dr. Sharol Given.   Objective: Vitals:   01/06/19 1035 01/06/19 1438 01/06/19 1440 01/06/19 2129  BP: 109/61 (!) 95/54 102/64 105/60  Pulse: 68 67  66  Resp: 20 18  18   Temp: 98.4 F (36.9 C) 97.8 F (36.6 C)  98.5 F (36.9 C)  TempSrc: Oral Oral  Oral  SpO2: 98% 100%  99%  Weight:      Height:        Intake/Output Summary (Last 24 hours) at 01/06/2019 2212 Last data filed at 01/06/2019 2130 Gross per 24 hour  Intake 1740.7 ml  Output 1200 ml  Net 540.7  ml   Filed Weights   12/25/18 1517 12/31/18 1248 01/02/19 1021  Weight: 115.7 kg 115.7 kg 115 kg    Exam: Constitutional:normal appearing female Eyes: EOMI, anicteric, normal conjunctivae ENMT: Oropharynx with moist mucous membranes,  Neck: FROM Cardiovascular: RRR systolic murmur, with no peripheral edema Respiratory: Normal respiratory effort on  room air, clear breath sounds  Abdomen: Soft,non-tender, normal bowel sounds Skin: bilateral lower legs in compression wraps and wound vacs draining appropriately.   Neurologic: Grossly no focal neuro deficit. Psychiatric:Appropriate affect, and mood. Mental status  Data Reviewed: CBC: Recent Labs  Lab 01/01/19 0304 01/02/19 0541 01/03/19 0556 01/04/19 0851 01/05/19 0517 01/06/19 0041  WBC 13.8* 9.2 10.9* 10.3 11.0* 10.5  NEUTROABS 10.8* 6.0  --   --   --   --   HGB 7.7* 7.2* 7.4* 7.4* 7.5* 8.7*  HCT 26.3* 25.3* 24.8* 26.0* 26.2* 29.3*  MCV 88.9 88.8 90.8 91.2 92.3 91.6  PLT 391 310 312 294 313 884   Basic Metabolic Panel: Recent Labs  Lab 01/02/19 0541 01/03/19 0556 01/04/19 0348 01/05/19 0517 01/05/19 1445 01/06/19 0041 01/06/19 1530  NA 138 139 140 140  --  136 141  K 3.0* 3.4* 2.9* 3.1*  --  3.0* 4.8  CL 99 101 102 103  --  101 108  CO2 31 29 29 29   --  27 26  GLUCOSE 102* 116* 117* 115*  --  134* 99  BUN 18 18 22* 24*  --  24* 23*  CREATININE 1.32* 1.40* 1.57* 1.46*  --  1.33* 1.30*  CALCIUM 8.7* 8.7* 8.8* 8.9  --  8.8* 9.2  MG 2.3  --   --   --  2.4  --   --    GFR: Estimated Creatinine Clearance: 63.3 mL/min (A) (by C-G formula based on SCr of 1.3 mg/dL (H)). Liver Function Tests: No results for input(s): AST, ALT, ALKPHOS, BILITOT, PROT, ALBUMIN in the last 168 hours. No results for input(s): LIPASE, AMYLASE in the last 168 hours. No results for input(s): AMMONIA in the last 168 hours. Coagulation Profile: No results for input(s): INR, PROTIME in the last 168 hours. Cardiac Enzymes: No results for input(s): CKTOTAL, CKMB, CKMBINDEX, TROPONINI in the last 168 hours. BNP (last 3 results) No results for input(s): PROBNP in the last 8760 hours. HbA1C: No results for input(s): HGBA1C in the last 72 hours. CBG: Recent Labs  Lab 01/02/19 1118  GLUCAP 105*   Lipid Profile: No results for input(s): CHOL, HDL, LDLCALC, TRIG, CHOLHDL, LDLDIRECT in the last  72 hours. Thyroid Function Tests: No results for input(s): TSH, T4TOTAL, FREET4, T3FREE, THYROIDAB in the last 72 hours. Anemia Panel: No results for input(s): VITAMINB12, FOLATE, FERRITIN, TIBC, IRON, RETICCTPCT in the last 72 hours. Urine analysis:    Component Value Date/Time   COLORURINE YELLOW 02/20/2018 2357   APPEARANCEUR HAZY (A) 02/20/2018 2357   LABSPEC 1.015 02/20/2018 2357   PHURINE 5.0 02/20/2018 2357   GLUCOSEU NEGATIVE 02/20/2018 2357   HGBUR SMALL (A) 02/20/2018 2357   BILIRUBINUR NEGATIVE 02/20/2018 2357   KETONESUR NEGATIVE 02/20/2018 2357   PROTEINUR NEGATIVE 02/20/2018 2357   UROBILINOGEN 0.2 04/10/2014 0949   NITRITE NEGATIVE 02/20/2018 2357   LEUKOCYTESUR LARGE (A) 02/20/2018 2357   Sepsis Labs: @LABRCNTIP (procalcitonin:4,lacticidven:4)  ) Recent Results (from the past 240 hour(s))  Surgical pcr screen     Status: None   Collection Time: 12/30/18  7:01 PM  Result Value Ref Range Status   MRSA, PCR NEGATIVE  NEGATIVE Final   Staphylococcus aureus NEGATIVE NEGATIVE Final    Comment: (NOTE) The Xpert SA Assay (FDA approved for NASAL specimens in patients 90 years of age and older), is one component of a comprehensive surveillance program. It is not intended to diagnose infection nor to guide or monitor treatment. Performed at [facility], [place] [address] [place], [place] [address]       Studies: No results found.  Scheduled Meds: . carvedilol  6.25 mg Oral BID  . [START ON 01/07/2019] chlorhexidine  60 mL Topical Once  . enoxaparin (LOVENOX) injection  40 mg Subcutaneous Q24H  . febuxostat  40 mg Oral Daily  . polyethylene glycol  17 g Oral Daily  . senna-docusate  1 tablet Oral BID  . sodium chloride flush  3 mL Intravenous Q12H    Continuous Infusions: . sodium chloride    . ceFEPime (MAXIPIME) IV 2 g (01/06/19 2146)  . lactated ringers 10 mL/hr at 12/31/18 1306  . metronidazole 500 mg (01/06/19 2146)  . vancomycin 1,000 mg  (01/06/19 1802)     LOS: 11 days     Desiree Hane, PA-S  01/06/2019, 10:12 PM

## 2019-01-07 ENCOUNTER — Encounter (HOSPITAL_COMMUNITY): Payer: Self-pay | Admitting: *Deleted

## 2019-01-07 ENCOUNTER — Inpatient Hospital Stay (HOSPITAL_COMMUNITY): Payer: Medicare Other | Admitting: Certified Registered"

## 2019-01-07 ENCOUNTER — Encounter (HOSPITAL_COMMUNITY)
Admission: EM | Disposition: A | Discharge status: Home Health | Payer: Self-pay | Source: Home / Self Care | Attending: Internal Medicine

## 2019-01-07 DIAGNOSIS — I83029 Varicose veins of left lower extremity with ulcer of unspecified site: Secondary | ICD-10-CM

## 2019-01-07 DIAGNOSIS — I83019 Varicose veins of right lower extremity with ulcer of unspecified site: Secondary | ICD-10-CM

## 2019-01-07 HISTORY — PX: SKIN SPLIT GRAFT: SHX444

## 2019-01-07 LAB — CBC
HCT: 29.8 % — ABNORMAL LOW (ref 36.0–46.0)
Hemoglobin: 8.6 g/dL — ABNORMAL LOW (ref 12.0–15.0)
MCH: 26.8 pg (ref 26.0–34.0)
MCHC: 28.9 g/dL — ABNORMAL LOW (ref 30.0–36.0)
MCV: 92.8 fL (ref 80.0–100.0)
Platelets: 276 10*3/uL (ref 150–400)
RBC: 3.21 MIL/uL — ABNORMAL LOW (ref 3.87–5.11)
RDW: 19.4 % — ABNORMAL HIGH (ref 11.5–15.5)
WBC: 9.1 10*3/uL (ref 4.0–10.5)
nRBC: 0 % (ref 0.0–0.2)

## 2019-01-07 LAB — BASIC METABOLIC PANEL
Anion gap: 9 (ref 5–15)
BUN: 25 mg/dL — ABNORMAL HIGH (ref 6–20)
CO2: 25 mmol/L (ref 22–32)
Calcium: 9.2 mg/dL (ref 8.9–10.3)
Chloride: 106 mmol/L (ref 98–111)
Creatinine, Ser: 1.2 mg/dL — ABNORMAL HIGH (ref 0.44–1.00)
GFR calc Af Amer: 59 mL/min — ABNORMAL LOW (ref >60)
GFR calc non Af Amer: 50 mL/min — ABNORMAL LOW (ref >60)
Glucose, Bld: 89 mg/dL (ref 70–99)
Potassium: 4.2 mmol/L (ref 3.5–5.1)
Sodium: 140 mmol/L (ref 135–145)

## 2019-01-07 SURGERY — APPLICATION, GRAFT, SKIN, SPLIT-THICKNESS
Anesthesia: General | Laterality: Bilateral

## 2019-01-07 MED ORDER — MIDAZOLAM HCL 2 MG/2ML IJ SOLN
INTRAMUSCULAR | Status: AC
  Filled 2019-01-07: qty 2

## 2019-01-07 MED ORDER — BISACODYL 10 MG RE SUPP
10.0000 mg | Freq: Every day | RECTAL | Status: DC | PRN
  Administered 2019-01-09: 10 mg via RECTAL
  Filled 2019-01-07: qty 1

## 2019-01-07 MED ORDER — FUROSEMIDE 40 MG PO TABS
60.0000 mg | ORAL_TABLET | Freq: Every day | ORAL | Status: DC
  Administered 2019-01-07 – 2019-01-09 (×3): 60 mg via ORAL
  Filled 2019-01-07 (×3): qty 1

## 2019-01-07 MED ORDER — LACTATED RINGERS IV SOLN: INTRAVENOUS | Status: DC

## 2019-01-07 MED ORDER — POLYETHYLENE GLYCOL 3350 17 G PO PACK: 17.0000 g | PACK | Freq: Every day | ORAL | Status: DC | PRN

## 2019-01-07 MED ORDER — ONDANSETRON HCL 4 MG PO TABS: 4.0000 mg | ORAL_TABLET | Freq: Four times a day (QID) | ORAL | Status: DC | PRN

## 2019-01-07 MED ORDER — CEFAZOLIN SODIUM-DEXTROSE 2-4 GM/100ML-% IV SOLN
2.0000 g | INTRAVENOUS | Status: AC
  Administered 2019-01-07: 2 g via INTRAVENOUS
  Filled 2019-01-07: qty 100

## 2019-01-07 MED ORDER — HYDROMORPHONE HCL 1 MG/ML IJ SOLN: 0.5000 mg | INTRAMUSCULAR | Status: DC | PRN

## 2019-01-07 MED ORDER — DEXAMETHASONE SODIUM PHOSPHATE 10 MG/ML IJ SOLN
INTRAMUSCULAR | Status: DC | PRN
  Administered 2019-01-07: 10 mg via INTRAVENOUS

## 2019-01-07 MED ORDER — PROPOFOL 10 MG/ML IV BOLUS
INTRAVENOUS | Status: DC | PRN
  Administered 2019-01-07: 180 mg via INTRAVENOUS

## 2019-01-07 MED ORDER — PROPOFOL 10 MG/ML IV BOLUS
INTRAVENOUS | Status: AC
  Filled 2019-01-07: qty 40

## 2019-01-07 MED ORDER — FENTANYL CITRATE (PF) 100 MCG/2ML IJ SOLN
INTRAMUSCULAR | Status: DC | PRN
  Administered 2019-01-07 (×3): 50 ug via INTRAVENOUS

## 2019-01-07 MED ORDER — METHOCARBAMOL 1000 MG/10ML IJ SOLN
500.0000 mg | Freq: Four times a day (QID) | INTRAVENOUS | Status: DC | PRN
  Filled 2019-01-07: qty 5

## 2019-01-07 MED ORDER — ONDANSETRON HCL 4 MG/2ML IJ SOLN
INTRAMUSCULAR | Status: AC
  Filled 2019-01-07: qty 2

## 2019-01-07 MED ORDER — METOCLOPRAMIDE HCL 5 MG PO TABS: 5.0000 mg | ORAL_TABLET | Freq: Three times a day (TID) | ORAL | Status: DC | PRN

## 2019-01-07 MED ORDER — ACETAMINOPHEN 325 MG PO TABS: 325.0000 mg | ORAL_TABLET | Freq: Four times a day (QID) | ORAL | Status: DC | PRN

## 2019-01-07 MED ORDER — LACTATED RINGERS IV SOLN
INTRAVENOUS | Status: DC
  Administered 2019-01-07: 11:00:00 via INTRAVENOUS

## 2019-01-07 MED ORDER — 0.9 % SODIUM CHLORIDE (POUR BTL) OPTIME
TOPICAL | Status: DC | PRN
  Administered 2019-01-07 (×2): 1000 mL

## 2019-01-07 MED ORDER — PHENYLEPHRINE 40 MCG/ML (10ML) SYRINGE FOR IV PUSH (FOR BLOOD PRESSURE SUPPORT)
PREFILLED_SYRINGE | INTRAVENOUS | Status: DC | PRN
  Administered 2019-01-07: 40 ug via INTRAVENOUS

## 2019-01-07 MED ORDER — DEXAMETHASONE SODIUM PHOSPHATE 10 MG/ML IJ SOLN
INTRAMUSCULAR | Status: AC
  Filled 2019-01-07: qty 1

## 2019-01-07 MED ORDER — MIDAZOLAM HCL 5 MG/5ML IJ SOLN
INTRAMUSCULAR | Status: DC | PRN
  Administered 2019-01-07: 2 mg via INTRAVENOUS

## 2019-01-07 MED ORDER — SODIUM CHLORIDE 0.9 % IV SOLN: INTRAVENOUS | Status: DC

## 2019-01-07 MED ORDER — FENTANYL CITRATE (PF) 100 MCG/2ML IJ SOLN
INTRAMUSCULAR | Status: AC
  Filled 2019-01-07: qty 2

## 2019-01-07 MED ORDER — FENTANYL CITRATE (PF) 100 MCG/2ML IJ SOLN
25.0000 ug | INTRAMUSCULAR | Status: DC | PRN
  Administered 2019-01-07 (×2): 50 ug via INTRAVENOUS

## 2019-01-07 MED ORDER — DOCUSATE SODIUM 100 MG PO CAPS
100.0000 mg | ORAL_CAPSULE | Freq: Two times a day (BID) | ORAL | Status: DC
  Administered 2019-01-07 – 2019-01-09 (×4): 100 mg via ORAL
  Filled 2019-01-07 (×4): qty 1

## 2019-01-07 MED ORDER — ONDANSETRON HCL 4 MG/2ML IJ SOLN
INTRAMUSCULAR | Status: DC | PRN
  Administered 2019-01-07: 4 mg via INTRAVENOUS

## 2019-01-07 MED ORDER — CEFAZOLIN SODIUM-DEXTROSE 2-4 GM/100ML-% IV SOLN
2.0000 g | Freq: Four times a day (QID) | INTRAVENOUS | Status: AC
  Administered 2019-01-07 – 2019-01-08 (×3): 2 g via INTRAVENOUS
  Filled 2019-01-07 (×3): qty 100

## 2019-01-07 MED ORDER — OXYCODONE HCL 5 MG PO TABS: 5.0000 mg | ORAL_TABLET | Freq: Once | ORAL | Status: DC | PRN

## 2019-01-07 MED ORDER — OXYCODONE HCL 5 MG PO TABS
10.0000 mg | ORAL_TABLET | ORAL | Status: DC | PRN
  Administered 2019-01-07 – 2019-01-08 (×5): 15 mg via ORAL
  Filled 2019-01-07 (×5): qty 3

## 2019-01-07 MED ORDER — METHOCARBAMOL 500 MG PO TABS: 500.0000 mg | ORAL_TABLET | Freq: Four times a day (QID) | ORAL | Status: DC | PRN

## 2019-01-07 MED ORDER — ACETAMINOPHEN 10 MG/ML IV SOLN: 1000.0000 mg | Freq: Once | INTRAVENOUS | Status: DC | PRN

## 2019-01-07 MED ORDER — LIDOCAINE 2% (20 MG/ML) 5 ML SYRINGE
INTRAMUSCULAR | Status: DC | PRN
  Administered 2019-01-07: 60 mg via INTRAVENOUS

## 2019-01-07 MED ORDER — METOCLOPRAMIDE HCL 5 MG/ML IJ SOLN: 5.0000 mg | Freq: Three times a day (TID) | INTRAMUSCULAR | Status: DC | PRN

## 2019-01-07 MED ORDER — MAGNESIUM CITRATE PO SOLN: 1.0000 | Freq: Once | ORAL | Status: DC | PRN

## 2019-01-07 MED ORDER — ONDANSETRON HCL 4 MG/2ML IJ SOLN: 4.0000 mg | Freq: Four times a day (QID) | INTRAMUSCULAR | Status: DC | PRN

## 2019-01-07 MED ORDER — PROMETHAZINE HCL 25 MG/ML IJ SOLN: 6.2500 mg | INTRAMUSCULAR | Status: DC | PRN

## 2019-01-07 MED ORDER — FENTANYL CITRATE (PF) 250 MCG/5ML IJ SOLN
INTRAMUSCULAR | Status: AC
  Filled 2019-01-07: qty 5

## 2019-01-07 MED ORDER — OXYCODONE HCL 5 MG/5ML PO SOLN: 5.0000 mg | Freq: Once | ORAL | Status: DC | PRN

## 2019-01-07 MED ORDER — OXYCODONE HCL 5 MG PO TABS: 5.0000 mg | ORAL_TABLET | ORAL | Status: DC | PRN

## 2019-01-07 SURGICAL SUPPLY — 49 items
ALLOGRAFT SKIN MESHD 125 SQ CM (Tissue) ×4 IMPLANT
BNDG COHESIVE 6X5 TAN STRL LF (GAUZE/BANDAGES/DRESSINGS) ×4 IMPLANT
BNDG ESMARK 4X9 LF (GAUZE/BANDAGES/DRESSINGS) ×1 IMPLANT
BNDG GAUZE STRTCH 6 (GAUZE/BANDAGES/DRESSINGS) IMPLANT
COVER SURGICAL LIGHT HANDLE (MISCELLANEOUS) ×6 IMPLANT
COVER WAND RF STERILE (DRAPES) ×1 IMPLANT
CUFF TOURNIQUET SINGLE 18IN (TOURNIQUET CUFF) IMPLANT
CUFF TOURNIQUET SINGLE 24IN (TOURNIQUET CUFF) IMPLANT
DERMACARRIERS GRAFT 1 TO 1.5 (DISPOSABLE)
DRAPE EXTREMITY BILATERAL (DRAPES) ×2 IMPLANT
DRAPE INCISE IOBAN 66X45 STRL (DRAPES) ×4 IMPLANT
DRAPE U-SHAPE 47X51 STRL (DRAPES) ×3 IMPLANT
DRESSING VERAFLO CLEANSE CC (GAUZE/BANDAGES/DRESSINGS) IMPLANT
DRSG ADAPTIC 3X8 NADH LF (GAUZE/BANDAGES/DRESSINGS) IMPLANT
DRSG MEPITEL 4X7.2 (GAUZE/BANDAGES/DRESSINGS) ×1 IMPLANT
DRSG VERAFLO CLEANSE CC (GAUZE/BANDAGES/DRESSINGS) ×9
DURAPREP 26ML APPLICATOR (WOUND CARE) ×5 IMPLANT
ELECT REM PT RETURN 9FT ADLT (ELECTROSURGICAL) ×3
ELECTRODE REM PT RTRN 9FT ADLT (ELECTROSURGICAL) ×1 IMPLANT
GAUZE SPONGE 4X4 12PLY STRL (GAUZE/BANDAGES/DRESSINGS) IMPLANT
GLOVE BIOGEL PI IND STRL 9 (GLOVE) ×1 IMPLANT
GLOVE BIOGEL PI INDICATOR 9 (GLOVE) ×2
GLOVE SURG ORTHO 9.0 STRL STRW (GLOVE) ×3 IMPLANT
GOWN STRL REUS W/ TWL XL LVL3 (GOWN DISPOSABLE) ×2 IMPLANT
GOWN STRL REUS W/TWL XL LVL3 (GOWN DISPOSABLE) ×4
GRAFT DERMACARRIERS 1 TO 1.5 (DISPOSABLE) IMPLANT
GRAFT TISS MESH 125 BURN (Tissue) IMPLANT
KIT BASIN OR (CUSTOM PROCEDURE TRAY) ×3 IMPLANT
KIT TURNOVER KIT B (KITS) ×3 IMPLANT
MANIFOLD NEPTUNE II (INSTRUMENTS) ×1 IMPLANT
NDL HYPO 25GX1X1/2 BEV (NEEDLE) IMPLANT
NEEDLE HYPO 25GX1X1/2 BEV (NEEDLE) IMPLANT
NS IRRIG 1000ML POUR BTL (IV SOLUTION) ×9 IMPLANT
PACK ORTHO EXTREMITY (CUSTOM PROCEDURE TRAY) ×3 IMPLANT
PAD ARMBOARD 7.5X6 YLW CONV (MISCELLANEOUS) ×6 IMPLANT
PAD CAST 4YDX4 CTTN HI CHSV (CAST SUPPLIES) IMPLANT
PADDING CAST COTTON 4X4 STRL (CAST SUPPLIES) ×4
SKIN MESHED 125 SQ CM (Tissue) ×12 IMPLANT
STAPLER VISISTAT (STAPLE) ×6 IMPLANT
SUCTION FRAZIER HANDLE 10FR (MISCELLANEOUS)
SUCTION TUBE FRAZIER 10FR DISP (MISCELLANEOUS) IMPLANT
SUT ETHILON 4 0 PS 2 18 (SUTURE) IMPLANT
SYR CONTROL 10ML LL (SYRINGE) IMPLANT
TOWEL OR 17X24 6PK STRL BLUE (TOWEL DISPOSABLE) ×3 IMPLANT
TOWEL OR 17X26 10 PK STRL BLUE (TOWEL DISPOSABLE) ×3 IMPLANT
TUBE CONNECTING 12'X1/4 (SUCTIONS)
TUBE CONNECTING 12X1/4 (SUCTIONS) IMPLANT
WATER STERILE IRR 1000ML POUR (IV SOLUTION) ×1 IMPLANT
WND VAC CANISTER 500ML (MISCELLANEOUS) ×4 IMPLANT

## 2019-01-07 NOTE — Progress Notes (Signed)
PT Cancellation Note  Patient Details Name: Kari [name] MRN: [id number] DOB: 1961/08/22   Cancelled Treatment:    Reason Eval/Treat Not Completed: Other (comment) Pt just getting back from OR and requesting to hold therapy until tomorrow. Will follow up as schedule allows.   Leighton Ruff, PT, DPT  Acute Rehabilitation Services  Pager: [phone] Office: [phone]    Rudean Hitt 01/07/2019, 2:33 PM

## 2019-01-07 NOTE — Op Note (Signed)
01/07/2019  1:02 PM  PATIENT:  Kari [name]    PRE-OPERATIVE DIAGNOSIS:  Venous Stasis Insufficiency Ulceration Bilateral Legs  POST-OPERATIVE DIAGNOSIS:  Same  PROCEDURE:  SPLIT THICKNESS SKIN GRAFT BILATERAL LEGS, APPLY 2 VACs Split-thickness skin graft for total of 500 cm. Wound bed preparation for split-thickness skin graft 500 cm.   SURGEON:  [name], MD  PHYSICIAN ASSISTANT:None ANESTHESIA:   General  PREOPERATIVE INDICATIONS:  [name] is a  58 y.o. female with a diagnosis of Venous Stasis Insufficiency Ulceration Bilateral Legs who failed conservative measures and elected for surgical management.    The risks benefits and alternatives were discussed with the patient preoperatively including but not limited to the risks of infection, bleeding, nerve injury, cardiopulmonary complications, the need for revision surgery, among others, and the patient was willing to proceed.  OPERATIVE IMPLANTS: Allograft split-thickness skin graft 500 cm  @ENCIMAGES @  OPERATIVE FINDINGS: Good granulation tissue.  OPERATIVE PROCEDURE: Patient was brought the operating room underwent a general anesthetic.  After adequate levels anesthesia were obtained patient's both lower extremities were prepped using DuraPrep draped into a sterile field a timeout was called.  Using a 21 blade knife rondure and Cobb elevator the wounds were debrided of fibrinous tissue.  The wound beds were flat after debridement.  The wounds were then irrigated with normal saline.  There was good petechial bleeding in both legs.  Split-thickness skin graft was was secured to both legs with staples.  DuoDERM was then used to outline the wound to prevent maceration around the wound edges.  The reticulated cleanse choice foam was applied to the wounds bilaterally covered by the solid cleanse choice foam this was covered with Ioban both legs had a good suction fit.  The wounds were then covered with web roll and  Coban for both lower extremities.  Patient was extubated taken the PACU in stable condition.   DISCHARGE PLANNING:  Antibiotic duration: IV antibiotics for 24 hours  Weightbearing: Weightbearing as tolerated bilateral lower extremities  Pain medication: High-dose opioids  Dressing care/ Wound VAC: Continue wound VAC for total of 2 weeks  Ambulatory devices: Walker  Discharge to: Patient will need discharge to skilled nursing.  Follow-up: In the office 1 week post operative.

## 2019-01-07 NOTE — Transfer of Care (Signed)
Immediate Anesthesia Transfer of Care Note  Patient: Kari [name]  Procedure(s) Performed: SPLIT THICKNESS SKIN GRAFT BILATERAL LEGS, APPLY VAC (Bilateral )  Patient Location: PACU  Anesthesia Type:General  Level of Consciousness: awake and patient cooperative  Airway & Oxygen Therapy: Patient Spontanous Breathing and Patient connected to face mask oxygen  Post-op Assessment: Report given to RN and Post -op Vital signs reviewed and stable  Post vital signs: Reviewed and stable  Last Vitals:  Vitals Value Taken Time  BP 143/87 01/07/2019 12:57 PM  Temp 36.5 C 01/07/2019 12:57 PM  Pulse 74 01/07/2019 12:58 PM  Resp 26 01/07/2019 12:58 PM  SpO2 90 % 01/07/2019 12:58 PM  Vitals shown include unvalidated device data.  Last Pain:  Vitals:   01/07/19 1257  TempSrc:   PainSc: 6       Patients Stated Pain Goal: 0 (60/73/71 0626)  Complications: No apparent anesthesia complications

## 2019-01-07 NOTE — Anesthesia Postprocedure Evaluation (Signed)
Anesthesia Post Note  Patient: Kari [name]  Procedure(s) Performed: SPLIT THICKNESS SKIN GRAFT BILATERAL LEGS, APPLY VAC (Bilateral )     Patient location during evaluation: PACU Anesthesia Type: General Level of consciousness: awake and alert Pain management: pain level controlled Vital Signs Assessment: post-procedure vital signs reviewed and stable Respiratory status: spontaneous breathing, nonlabored ventilation and respiratory function stable Cardiovascular status: blood pressure returned to baseline and stable Postop Assessment: no apparent nausea or vomiting Anesthetic complications: no    Last Vitals:  Vitals:   01/07/19 1320 01/07/19 1340  BP: (!) 100/59 129/83  Pulse: 69 67  Resp: 15 17  Temp: 36.5 C 36.9 C  SpO2: 96% 100%    Last Pain:  Vitals:   01/07/19 1347  TempSrc:   PainSc: [place]

## 2019-01-07 NOTE — Interval H&P Note (Signed)
History and Physical Interval Note:  01/07/2019 6:49 AM  Kari [name]  has presented today for surgery, with the diagnosis of Venous Stasis Insufficiency Ulceration Bilateral Legs  The various methods of treatment have been discussed with the patient and family. After consideration of risks, benefits and other options for treatment, the patient has consented to  Procedure(s): SPLIT THICKNESS SKIN GRAFT BILATERAL LEGS, APPLY VAC (Bilateral) as a surgical intervention .  The patient's history has been reviewed, patient examined, no change in status, stable for surgery.  I have reviewed the patient's chart and labs.  Questions were answered to the patient's satisfaction.     [name]

## 2019-01-07 NOTE — Anesthesia Procedure Notes (Signed)
Procedure Name: LMA Insertion Date/Time: 01/07/2019 12:19 PM Performed by: Valda Favia, CRNA Pre-anesthesia Checklist: Patient identified, Emergency Drugs available, Suction available and Patient being monitored Patient Re-evaluated:Patient Re-evaluated prior to induction Oxygen Delivery Method: Circle System Utilized Preoxygenation: Pre-oxygenation with 100% oxygen Induction Type: IV induction Ventilation: Mask ventilation without difficulty LMA: LMA inserted LMA Size: 4.0 Number of attempts: 1 Airway Equipment and Method: Bite block Placement Confirmation: positive ETCO2 Tube secured with: Tape Dental Injury: Teeth and Oropharynx as per pre-operative assessment

## 2019-01-07 NOTE — Care Management Note (Signed)
Case Management Note  Patient Details  Name: NISHKA HEIDE MRN: [id number] Date of Birth: 08/18/1961  Subjective/Objective:                    Action/Plan:  Patient from home with home health through [facility]. Await PT recommendations.  Incisional VAC  Expected Discharge Date:                  Expected Discharge Plan:     In-House Referral:     Discharge planning Services  CM Consult  Post Acute Care Choice:  Durable Medical Equipment, Home Health Choice offered to:     DME Arranged:    DME Agency:     HH Arranged:    HH Agency:     Status of Service:  In process, will continue to follow  If discussed at Long Length of Stay Meetings, dates discussed:    Additional Comments:  Marilu Favre, RN 01/07/2019, 1:48 PM

## 2019-01-07 NOTE — Social Work (Signed)
CSW acknowledging consult for SNF placement. Will follow for therapy recommendations.   Analeia Ismael, MSW, LCSWA [facility] [phone]   

## 2019-01-07 NOTE — Progress Notes (Signed)
PROGRESS NOTE    Kari [name]  OJJ:[id number] DOB: [birth year] DOA: 12/25/2018 PCP: Rutherford Guys, MD     Brief Narrative:  Kari [name] is a 58 y.o. 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 Sharol Given to attempt limb salvage surgically.  New events last 24 hours / Subjective: No new physical complaints this morning.  Denies any chest pain, shortness of breath, nausea or vomiting.  Awaiting to go to the OR for skin graft this afternoon.  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 Sharol Given with orthopedics.  -S/p split thickness skin graft 1/15  -Currently on vancomycin, IV Flagyl, IV cefepime.  1 of 2 blood cultures growing micrococcus species which is likely contaminant.   Chronic systolicCHF -EF 93-71%, 05/9677, ICD placement for secondary prevention -Patient remains euvolemic on exam -Continue home Coreg, resume lasix   CKD stage III -Creatinine remained stable at baseline (range creatinine 1.3-1.4)  Chronic normocytic anemia -Most  likely anemia of chronic disease related to CKD? S/p 1u pRBC on 1/13. Hgb stable   Sarcoidosis -Stable   DVT prophylaxis: Lovenox  Code Status: Full Family Communication: At bedside Disposition Plan: Pending skin graft procedure today in OR with Dr. Sharol Given, PT evaluation   Consultants:   Dr. Sharol Given, ortho   Antimicrobials:  Anti-infectives (From admission, onward)   Start     Dose/Rate Route Frequency Ordered Stop   01/07/19 1830  ceFAZolin (ANCEF) IVPB 2g/100 mL premix     2 g 200 mL/hr over 30 Minutes Intravenous Every 6 hours 01/07/19 1341 01/08/19 1229   01/07/19 0930  ceFAZolin (ANCEF) IVPB 2g/100 mL premix     2 g 200 mL/hr over 30 Minutes Intravenous To ShortStay Surgical 01/07/19 0900 01/07/19 1222   01/06/19 1800  vancomycin (VANCOCIN) IVPB 1000 mg/200 mL premix     1,000 mg 200 mL/hr over 60 Minutes Intravenous Every 36 hours 01/05/19 1021     01/06/19 0600  metroNIDAZOLE (FLAGYL) IVPB 500 mg     500 mg 100 mL/hr over 60 Minutes Intravenous Every 8 hours 01/06/19 0246     01/02/19 0600  ceFAZolin (ANCEF) IVPB 2g/100 mL premix  Status:  Discontinued     2 g 200 mL/hr over 30 Minutes Intravenous To Short Stay 01/01/19 1447 01/02/19 0913   01/01/19 0600  vancomycin (VANCOCIN) IVPB 1000 mg/200 mL premix  Status:  Discontinued     1,000 mg 200 mL/hr over 60 Minutes Intravenous Every 24 hours 01/01/19 0508 01/05/19 1021   12/31/18 0600  ceFAZolin (ANCEF) IVPB 2g/100 mL premix     2 g 200 mL/hr over 30 Minutes Intravenous On call to  O.R. 12/30/18 1829 12/31/18 1502   12/30/18 1400  ceFEPIme (MAXIPIME) 2 g in sodium chloride 0.9 % 100 mL IVPB     2 g 200 mL/hr over 30 Minutes Intravenous Every 8 hours 12/30/18 0955     12/30/18 1000  metroNIDAZOLE (FLAGYL) IVPB 500 mg  Status:  Discontinued     500 mg 100 mL/hr over 60 Minutes Intravenous Every 8 hours 12/30/18 0954 01/06/19 0246   12/28/18 0600  vancomycin (VANCOCIN) 1,250 mg in sodium chloride 0.9 % 250 mL IVPB  Status:   Discontinued     1,250 mg 166.7 mL/hr over 90 Minutes Intravenous Every 24 hours 12/27/18 1029 01/01/19 0508   12/27/18 0600  vancomycin (VANCOCIN) IVPB 1000 mg/200 mL premix  Status:  Discontinued     1,000 mg 200 mL/hr over 60 Minutes Intravenous Every 24 hours 12/26/18 0521 12/27/18 1029   12/26/18 0900  piperacillin-tazobactam (ZOSYN) IVPB 3.375 g  Status:  Discontinued     3.375 g 12.5 mL/hr over 240 Minutes Intravenous Every 8 hours 12/26/18 0831 12/30/18 0953   12/26/18 0145  vancomycin (VANCOCIN) 2,000 mg in sodium chloride 0.9 % 500 mL IVPB     2,000 mg 250 mL/hr over 120 Minutes Intravenous  Once 12/26/18 0131 12/26/18 0507   12/26/18 0130  piperacillin-tazobactam (ZOSYN) IVPB 3.375 g     3.375 g 100 mL/hr over 30 Minutes Intravenous  Once 12/26/18 0125 12/26/18 0352       Objective: Vitals:   01/07/19 1257 01/07/19 1312 01/07/19 1320 01/07/19 1340  BP: (!) 143/87 138/85 (!) 100/59 129/83  Pulse: 74 73 69 67  Resp: (!) 24 14 15 17   Temp: 97.7 F (36.5 C)  97.7 F (36.5 C) 98.4 F (36.9 C)  TempSrc:    Oral  SpO2: 95% 99% 96% 100%  Weight:      Height:        Intake/Output Summary (Last 24 hours) at 01/07/2019 1458 Last data filed at 01/07/2019 1320 Gross per 24 hour  Intake 1662.01 ml  Output 1455 ml  Net 207.01 ml   Filed Weights   12/31/18 1248 01/02/19 1021 01/07/19 1103  Weight: 115.7 kg 115 kg 115 kg    Examination:  General exam: Appears calm and comfortable  Respiratory system: Clear to auscultation. Respiratory effort normal. Cardiovascular system: S1 & S2 heard, RRR. No JVD, murmurs, rubs, gallops or clicks. No pedal edema. Gastrointestinal system: Abdomen is nondistended, soft and nontender. No organomegaly or masses felt. Normal bowel sounds heard. Central nervous system: Alert and oriented. No focal neurological deficits. Extremities: Bilateral LE with bandaging and wound vac  Psychiatry: Judgement and insight appear normal. Mood & affect  appropriate.   Data Reviewed: I have personally reviewed following labs and imaging studies  CBC: Recent Labs  Lab 01/01/19 0304 01/02/19 0541 01/03/19 0556 01/04/19 0851 01/05/19 0517 01/06/19 0041 01/07/19 0234  WBC 13.8* 9.2 10.9* 10.3 11.0* 10.5 9.1  NEUTROABS 10.8* 6.0  --   --   --   --   --   HGB 7.7* 7.2* 7.4* 7.4* 7.5* 8.7* 8.6*  HCT 26.3* 25.3* 24.8* 26.0* 26.2* 29.3* 29.8*  MCV 88.9 88.8 90.8 91.2 92.3 91.6 92.8  PLT 391 310 312 294 313 282 161   Basic Metabolic Panel: Recent Labs  Lab 01/02/19 0541  01/04/19 0348 01/05/19 0517 01/05/19 1445 01/06/19 0041 01/06/19 1530 01/07/19 0234  NA 138   < > 140 140  --  136 141 140  K 3.0*   < >  2.9* 3.1*  --  3.0* 4.8 4.2  CL 99   < > 102 103  --  101 108 106  CO2 31   < > 29 29  --  27 26 25   GLUCOSE 102*   < > 117* 115*  --  134* 99 89  BUN 18   < > 22* 24*  --  24* 23* 25*  CREATININE 1.32*   < > 1.57* 1.46*  --  1.33* 1.30* 1.20*  CALCIUM 8.7*   < > 8.8* 8.9  --  8.8* 9.2 9.2  MG 2.3  --   --   --  2.4  --   --   --    < > = values in this interval not displayed.   GFR: Estimated Creatinine Clearance: 68.6 mL/min (A) (by C-G formula based on SCr of 1.2 mg/dL (H)). Liver Function Tests: No results for input(s): AST, ALT, ALKPHOS, BILITOT, PROT, ALBUMIN in the last 168 hours. No results for input(s): LIPASE, AMYLASE in the last 168 hours. No results for input(s): AMMONIA in the last 168 hours. Coagulation Profile: No results for input(s): INR, PROTIME in the last 168 hours. Cardiac Enzymes: No results for input(s): CKTOTAL, CKMB, CKMBINDEX, TROPONINI in the last 168 hours. BNP (last 3 results) No results for input(s): PROBNP in the last 8760 hours. HbA1C: No results for input(s): HGBA1C in the last 72 hours. CBG: Recent Labs  Lab 01/02/19 1118  GLUCAP 105*   Lipid Profile: No results for input(s): CHOL, HDL, LDLCALC, TRIG, CHOLHDL, LDLDIRECT in the last 72 hours. Thyroid Function Tests: No results  for input(s): TSH, T4TOTAL, FREET4, T3FREE, THYROIDAB in the last 72 hours. Anemia Panel: No results for input(s): VITAMINB12, FOLATE, FERRITIN, TIBC, IRON, RETICCTPCT in the last 72 hours. Sepsis Labs: No results for input(s): PROCALCITON, LATICACIDVEN in the last 168 hours.  Recent Results (from the past 240 hour(s))  Surgical pcr screen     Status: None   Collection Time: 12/30/18  7:01 PM  Result Value Ref Range Status   MRSA, PCR NEGATIVE NEGATIVE Final   Staphylococcus aureus NEGATIVE NEGATIVE Final    Comment: (NOTE) The Xpert SA Assay (FDA approved for NASAL specimens in patients 90 years of age and older), is one component of a comprehensive surveillance program. It is not intended to diagnose infection nor to guide or monitor treatment. Performed at [facility], [place] [address], [place], [place] [address]        Radiology Studies: No results found.    Scheduled Meds: . carvedilol  6.25 mg Oral BID  . docusate sodium  100 mg Oral BID  . enoxaparin (LOVENOX) injection  40 mg Subcutaneous Q24H  . febuxostat  40 mg Oral Daily  . fentaNYL      . furosemide  60 mg Oral Daily  . polyethylene glycol  17 g Oral Daily  . senna-docusate  1 tablet Oral BID  . sodium chloride flush  3 mL Intravenous Q12H   Continuous Infusions: . sodium chloride    . sodium chloride    .  ceFAZolin (ANCEF) IV    . ceFEPime (MAXIPIME) IV 2 g (01/07/19 0537)  . lactated ringers 10 mL/hr at 12/31/18 1306  . lactated ringers    . lactated ringers    . methocarbamol (ROBAXIN) IV    . metronidazole 500 mg (01/07/19 0538)  . vancomycin Stopped (01/06/19 1926)     LOS: 12 days    Time spent: 40  minutes   Dessa Phi, DO [facility] [email or web address] 01/07/2019, 2:58 PM

## 2019-01-07 NOTE — Anesthesia Preprocedure Evaluation (Addendum)
Anesthesia Evaluation  Patient identified by MRN, date of birth, ID band Patient awake    Reviewed: Allergy & Precautions, NPO status , Patient's Chart, lab work & pertinent test results  History of Anesthesia Complications Negative for: history of anesthetic complications  Airway Mallampati: III  TM Distance: >3 FB Neck ROM: Full    Dental  (+) Dental Advisory Given, Edentulous Upper, Edentulous Lower   Pulmonary asthma ,  Sarcoidosis   Pulmonary exam normal breath sounds clear to auscultation       Cardiovascular hypertension, Pt. on medications + Peripheral Vascular Disease and +CHF  Normal cardiovascular exam+ Cardiac Defibrillator (Placed s/p VT arrest in 2018)  Rhythm:Regular Rate:Normal  TTE 2018: EF 40-45%, mild AR, moderate MR   Neuro/Psych  Headaches, Depression    GI/Hepatic negative GI ROS, Neg liver ROS,   Endo/Other  Morbid obesity  Renal/GU Renal InsufficiencyRenal disease     Musculoskeletal negative musculoskeletal ROS (+)   Abdominal   Peds  Hematology  (+) anemia ,   Anesthesia Other Findings Day of surgery medications reviewed with the patient.  Reproductive/Obstetrics                           Anesthesia Physical Anesthesia Plan  ASA: III  Anesthesia Plan: General   Post-op Pain Management:    Induction: Intravenous  PONV Risk Score and Plan: 3 and Treatment may vary due to age or medical condition, Ondansetron, Dexamethasone and Midazolam  Airway Management Planned: LMA  Additional Equipment:   Intra-op Plan:   Post-operative Plan: Extubation in OR  Informed Consent: I have reviewed the patients History and Physical, chart, labs and discussed the procedure including the risks, benefits and alternatives for the proposed anesthesia with the patient or authorized representative who has indicated his/her understanding and acceptance.     Dental advisory  given  Plan Discussed with: CRNA  Anesthesia Plan Comments:        Anesthesia Quick Evaluation

## 2019-01-08 ENCOUNTER — Encounter (HOSPITAL_COMMUNITY): Payer: Self-pay | Admitting: Orthopedic Surgery

## 2019-01-08 LAB — BASIC METABOLIC PANEL
Anion gap: 8 (ref 5–15)
BUN: 31 mg/dL — ABNORMAL HIGH (ref 6–20)
CO2: 24 mmol/L (ref 22–32)
Calcium: 9.1 mg/dL (ref 8.9–10.3)
Chloride: 107 mmol/L (ref 98–111)
Creatinine, Ser: 1.49 mg/dL — ABNORMAL HIGH (ref 0.44–1.00)
GFR calc Af Amer: 45 mL/min — ABNORMAL LOW (ref >60)
GFR calc non Af Amer: 39 mL/min — ABNORMAL LOW (ref >60)
Glucose, Bld: 159 mg/dL — ABNORMAL HIGH (ref 70–99)
Potassium: 4 mmol/L (ref 3.5–5.1)
Sodium: 139 mmol/L (ref 135–145)

## 2019-01-08 LAB — CBC
HCT: 29.7 % — ABNORMAL LOW (ref 36.0–46.0)
Hemoglobin: 8.9 g/dL — ABNORMAL LOW (ref 12.0–15.0)
MCH: 27.5 pg (ref 26.0–34.0)
MCHC: 30 g/dL (ref 30.0–36.0)
MCV: 91.7 fL (ref 80.0–100.0)
Platelets: 286 10*3/uL (ref 150–400)
RBC: 3.24 MIL/uL — ABNORMAL LOW (ref 3.87–5.11)
RDW: 18.7 % — ABNORMAL HIGH (ref 11.5–15.5)
WBC: 12.4 10*3/uL — ABNORMAL HIGH (ref 4.0–10.5)
nRBC: 0 % (ref 0.0–0.2)

## 2019-01-08 NOTE — Progress Notes (Addendum)
PROGRESS NOTE    Kari [name]  WFU:[id number] DOB: 1961/08/03 DOA: 12/25/2018 PCP: Rutherford Guys, MD     Brief Narrative:  Kari [name] is a 58 y.o. year old female with medical history significant for CHF with reduced ejection fraction status post ICD placement for secondary prevention (history of resuscitated sudden death), hypertension, CKD stage III, morbid obesity, chronic venous stasis ulcers who presented on 12/25/2018 with several days of worsening drainage and foul smell from lower extremity leg ulcers and was found to have purulent cellulitis bilateral lower extremities related to ulcerations requiring empiric IV antibiotics as well as multiple debridements (1/8, 1/10) by Dr. Sharol Given to attempt limb salvage surgically. She underwent skin graft 1/15.   New events last 24 hours / Subjective: Continues to have pain in bilateral shins with wound vac. No other complaints.   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 Sharol Given with orthopedics.  -S/p split thickness skin graft 1/15  -Currently on vancomycin, IV Flagyl, IV cefepime.  Last day of antibiotics today, day #14. Discussed with pharmacy. 1 of 2 blood cultures growing micrococcus species which is likely contaminant.   Chronic systolicCHF -EF 20-25%, 03/2705, ICD placement for secondary prevention -Patient remains euvolemic on exam -Continue home Coreg, lasix   CKD stage III -Creatinine remained stable at baseline (range creatinine 1.3-1.4)  Chronic  normocytic anemia -Most likely anemia of chronic disease related to CKD? S/p 1u pRBC on 1/13. Hgb stable   Sarcoidosis -Stable   DVT prophylaxis: Lovenox  Code Status: Full Family Communication: No family at bedside  Disposition Plan: Pending PT eval, likely SNF discharge with wound vac    Consultants:   Dr. Sharol Given, ortho   Antimicrobials:  Anti-infectives (From admission, onward)   Start     Dose/Rate Route Frequency Ordered Stop   01/07/19 1830  ceFAZolin (ANCEF) IVPB 2g/100 mL premix     2 g 200 mL/hr over 30 Minutes Intravenous Every 6 hours 01/07/19 1341 01/08/19 0548   01/07/19 0930  ceFAZolin (ANCEF) IVPB 2g/100 mL premix     2 g 200 mL/hr over 30 Minutes Intravenous To ShortStay Surgical 01/07/19 0900 01/07/19 1222   01/06/19 1800  vancomycin (VANCOCIN) IVPB 1000 mg/200 mL premix     1,000 mg 200 mL/hr over 60 Minutes Intravenous Every 36 hours 01/05/19 1021 01/08/19 0655   01/06/19 0600  metroNIDAZOLE (FLAGYL) IVPB 500 mg     500 mg 100 mL/hr over 60 Minutes Intravenous Every 8 hours 01/06/19 0246 01/08/19 2359   01/02/19 0600  ceFAZolin (ANCEF) IVPB 2g/100 mL premix  Status:  Discontinued     2 g 200 mL/hr over 30 Minutes Intravenous To Short Stay 01/01/19 1447 01/02/19 0913   01/01/19 0600  vancomycin (VANCOCIN) IVPB 1000 mg/200 mL premix  Status:  Discontinued     1,000 mg 200 mL/hr over 60 Minutes Intravenous Every 24 hours 01/01/19 0508 01/05/19 1021   12/31/18 0600  ceFAZolin (ANCEF) IVPB 2g/100 mL premix     2 g 200 mL/hr over 30 Minutes Intravenous  On call to O.R. 12/30/18 1829 12/31/18 1502   12/30/18 1400  ceFEPIme (MAXIPIME) 2 g in sodium chloride 0.9 % 100 mL IVPB     2 g 200 mL/hr over 30 Minutes Intravenous Every 8 hours 12/30/18 0955 01/08/19 2359   12/30/18 1000  metroNIDAZOLE (FLAGYL) IVPB 500 mg  Status:  Discontinued     500 mg 100 mL/hr over 60 Minutes Intravenous Every 8 hours 12/30/18 0954 01/06/19 0246   12/28/18 0600  vancomycin  (VANCOCIN) 1,250 mg in sodium chloride 0.9 % 250 mL IVPB  Status:  Discontinued     1,250 mg 166.7 mL/hr over 90 Minutes Intravenous Every 24 hours 12/27/18 1029 01/01/19 0508   12/27/18 0600  vancomycin (VANCOCIN) IVPB 1000 mg/200 mL premix  Status:  Discontinued     1,000 mg 200 mL/hr over 60 Minutes Intravenous Every 24 hours 12/26/18 0521 12/27/18 1029   12/26/18 0900  piperacillin-tazobactam (ZOSYN) IVPB 3.375 g  Status:  Discontinued     3.375 g 12.5 mL/hr over 240 Minutes Intravenous Every 8 hours 12/26/18 0831 12/30/18 0953   12/26/18 0145  vancomycin (VANCOCIN) 2,000 mg in sodium chloride 0.9 % 500 mL IVPB     2,000 mg 250 mL/hr over 120 Minutes Intravenous  Once 12/26/18 0131 12/26/18 0507   12/26/18 0130  piperacillin-tazobactam (ZOSYN) IVPB 3.375 g     3.375 g 100 mL/hr over 30 Minutes Intravenous  Once 12/26/18 0125 12/26/18 0352       Objective: Vitals:   01/07/19 1340 01/07/19 2100 01/08/19 0213 01/08/19 0542  BP: 129/83 118/68 101/60 109/60  Pulse: 67 79 72 66  Resp: 17 18 18 20   Temp: 98.4 F (36.9 C) 98.5 F (36.9 C) 98 F (36.7 C) 98.3 F (36.8 C)  TempSrc: Oral Oral Oral Oral  SpO2: 100% 98% 98% 96%  Weight:      Height:        Intake/Output Summary (Last 24 hours) at 01/08/2019 1154 Last data filed at 01/08/2019 0555 Gross per 24 hour  Intake 1792.17 ml  Output 1375 ml  Net 417.17 ml   Filed Weights   12/31/18 1248 01/02/19 1021 01/07/19 1103  Weight: 115.7 kg 115 kg 115 kg    Examination: General exam: Appears calm and comfortable  Respiratory system: Clear to auscultation. Respiratory effort normal. Cardiovascular system: S1 & S2 heard, RRR. No JVD, murmurs, rubs, gallops or clicks. No pedal edema. Gastrointestinal system: Abdomen is nondistended, soft and nontender. No organomegaly or masses felt. Normal bowel sounds heard. Central nervous system: Alert and oriented. No focal neurological deficits. Extremities: Bilateral LE wrapped and wound  vac in place  Psychiatry: Judgement and insight appear normal. Mood & affect appropriate.    Data Reviewed: I have personally reviewed following labs and imaging studies  CBC: Recent Labs  Lab 01/02/19 0541  01/04/19 0851 01/05/19 0517 01/06/19 0041 01/07/19 0234 01/08/19 0304  WBC 9.2   < > 10.3 11.0* 10.5 9.1 12.4*  NEUTROABS 6.0  --   --   --   --   --   --   HGB 7.2*   < > 7.4* 7.5* 8.7* 8.6* 8.9*  HCT 25.3*   < > 26.0* 26.2* 29.3* 29.8* 29.7*  MCV 88.8   < > 91.2 92.3 91.6 92.8 91.7  PLT 310   < > 294 313 282 276 286   < > = values in this interval not displayed.   Basic Metabolic Panel: Recent Labs  Lab 01/02/19 0541  01/05/19 0517 01/05/19 1445 01/06/19 0041 01/06/19 1530 01/07/19 0234 01/08/19 0304  NA 138   < > 140  --  136 141 140 139  K 3.0*   < > 3.1*  --  3.0* 4.8 4.2 4.0  CL 99   < > 103  --  101 108 106 107  CO2 31   < > 29  --  27 26 25 24   GLUCOSE 102*   < > 115*  --  134* 99 89 159*  BUN 18   < > 24*  --  24* 23* 25* 31*  CREATININE 1.32*   < > 1.46*  --  1.33* 1.30* 1.20* 1.49*  CALCIUM 8.7*   < > 8.9  --  8.8* 9.2 9.2 9.1  MG 2.3  --   --  2.4  --   --   --   --    < > = values in this interval not displayed.   GFR: Estimated Creatinine Clearance: 55.2 mL/min (A) (by C-G formula based on SCr of 1.49 mg/dL (H)). Liver Function Tests: No results for input(s): AST, ALT, ALKPHOS, BILITOT, PROT, ALBUMIN in the last 168 hours. No results for input(s): LIPASE, AMYLASE in the last 168 hours. No results for input(s): AMMONIA in the last 168 hours. Coagulation Profile: No results for input(s): INR, PROTIME in the last 168 hours. Cardiac Enzymes: No results for input(s): CKTOTAL, CKMB, CKMBINDEX, TROPONINI in the last 168 hours. BNP (last 3 results) No results for input(s): PROBNP in the last 8760 hours. HbA1C: No results for input(s): HGBA1C in the last 72 hours. CBG: Recent Labs  Lab 01/02/19 1118  GLUCAP 105*   Lipid Profile: No results for  input(s): CHOL, HDL, LDLCALC, TRIG, CHOLHDL, LDLDIRECT in the last 72 hours. Thyroid Function Tests: No results for input(s): TSH, T4TOTAL, FREET4, T3FREE, THYROIDAB in the last 72 hours. Anemia Panel: No results for input(s): VITAMINB12, FOLATE, FERRITIN, TIBC, IRON, RETICCTPCT in the last 72 hours. Sepsis Labs: No results for input(s): PROCALCITON, LATICACIDVEN in the last 168 hours.  Recent Results (from the past 240 hour(s))  Surgical pcr screen     Status: None   Collection Time: 12/30/18  7:01 PM  Result Value Ref Range Status   MRSA, PCR NEGATIVE NEGATIVE Final   Staphylococcus aureus NEGATIVE NEGATIVE Final    Comment: (NOTE) The Xpert SA Assay (FDA approved for NASAL specimens in patients 42 years of age and older), is one component of a comprehensive surveillance program. It is not intended to diagnose infection nor to guide or monitor treatment. Performed at [facility], [place] [address], [place], [place] [address]        Radiology Studies: No results found.    Scheduled Meds: . carvedilol  6.25 mg Oral BID  . docusate sodium  100 mg Oral BID  . enoxaparin (LOVENOX) injection  40 mg Subcutaneous Q24H  . febuxostat  40 mg Oral Daily  . furosemide  60 mg Oral Daily  . polyethylene glycol  17 g Oral Daily  . senna-docusate  1 tablet Oral BID  . sodium chloride flush  3 mL Intravenous Q12H   Continuous Infusions: . sodium chloride    . sodium chloride    . ceFEPime (MAXIPIME) IV 2 g (01/08/19 0519)  . lactated ringers 10 mL/hr at 12/31/18 1306  . lactated ringers    . lactated ringers    . methocarbamol (ROBAXIN) IV    . metronidazole 500 mg (01/08/19 0554)  LOS: 13 days    Time spent: 25 minutes   Dessa Phi, DO [facility] [email or web address] 01/08/2019, 11:54 AM

## 2019-01-08 NOTE — Evaluation (Signed)
Physical Therapy Evaluation Patient Details Name: Kari [name] MRN: [id number] DOB: 10/27/1961 Today's Date: 01/08/2019   History of Present Illness  58 y.o. year old female with medical history significant for CHF with reduced ejection fraction status post ICD placement for secondary prevention ( history of resuscitated sudden death), hypertension, CKD stage III, morbid obesity, chronic venous stasis ulcers who presented on 12/25/2018 with several days of worsening drainage and foul smell from lower extremity leg ulcers and was found to have purulent cellulitis bilateral lower extremities related to ulcerations requiring empiric IV antibiotics as well as multiple debridements by Dr. Sharol Given to attempt limb salvage surgically. Pt s/p SPLIT THICKNESS SKIN GRAFT BILATERAL LEGS, APPLY 2 VACs 01/07/19.  Clinical Impression  Pt has had limited household mobility without AD since onset of bilateral LE ulcers. Pt very motivated to get out of bed, but concerned about the wound vacs. Pt supervision for bed mobility, however on sitting on the EoB pt reported weird dizzy feel from IV pain medication which was being administered when therapy entered the room. Pt attempted sit>stand to RW and then with mod HHA of therapist. Pt unable to achieve fully upright due to dizziness. Pt returned to bed with min A. RN notified. PT agrees with SNF level rehab at discharge. PT will continue to follow acutely.      Follow Up Recommendations SNF    Equipment Recommendations  Other (comment)(TBD at next venue)    Recommendations for Other Services       Precautions / Restrictions Precautions Precautions: Fall Precaution Comments: bilateral LE Wound vacs Restrictions Weight Bearing Restrictions: Yes RLE Weight Bearing: Weight bearing as tolerated LLE Weight Bearing: Weight bearing as tolerated      Mobility  Bed Mobility Overal bed mobility: Needs Assistance Bed Mobility: Supine to Sit;Sit to Supine     Supine  to sit: Supervision Sit to supine: Min assist   General bed mobility comments: supervision for safety to come to EoB, min A for management of LE back into bed, pt supervison for scooting herself up in bed with use of bedrails  Transfers Overall transfer level: Needs assistance Equipment used: 1 person hand held assist;Rolling walker (2 wheeled) Transfers: Sit to/from Stand Sit to Stand: Mod assist         General transfer comment: 1 attempt up to RW unable to attain upright, with modA from therapist able to come almost to upright however became dizzy and sat down quickly  Ambulation/Gait             General Gait Details: unable to attempt        Balance Overall balance assessment: Needs assistance Sitting-balance support: Feet supported;No upper extremity supported Sitting balance-Leahy Scale: Fair     Standing balance support: Bilateral upper extremity supported Standing balance-Leahy Scale: Zero                               Pertinent Vitals/Pain Pain Assessment: 0-10 Pain Score: 9  Pain Location: bilateral LE Pain Descriptors / Indicators: Throbbing;Tender;Aching;Sore Pain Intervention(s): Limited activity within patient's tolerance;Monitored during session;RN gave pain meds during session;Repositioned    Home Living Family/patient expects to be discharged to:: Skilled nursing facility                      Prior Function Level of Independence: Needs assistance   Gait / Transfers Assistance Needed: ambulates household distances  ADL's / [employer]  Needed: independent with ADLs and sister helps with iADLs        Hand Dominance   Dominant Hand: Right    Extremity/Trunk Assessment   Upper Extremity Assessment Upper Extremity Assessment: Overall WFL for tasks assessed    Lower Extremity Assessment Lower Extremity Assessment: RLE deficits/detail;LLE deficits/detail RLE Deficits / Details: ankle and calf wrapped in  [place], knee and hip ROM WFL, strength grossly assessed during mobility 4/5  RLE: Unable to fully assess due to pain;Unable to fully assess due to immobilization RLE Sensation: decreased light touch LLE Deficits / Details: ankle and calf wrapped in [place], knee and hip ROM WFL, strength grossly assessed during mobility 4/5  LLE: Unable to fully assess due to pain;Unable to fully assess due to immobilization LLE Coordination: decreased fine motor       Communication   Communication: No difficulties  Cognition Arousal/Alertness: Awake/alert Behavior During Therapy: WFL for tasks assessed/performed Overall Cognitive Status: Within Functional Limits for tasks assessed                                        General Comments General comments (skin integrity, edema, etc.): Pt with dizziness on standing after IV pain medication, symptoms improved with return to supine.         Assessment/Plan    PT Assessment Patient needs continued PT services  PT Problem List Decreased strength;Decreased range of motion;Decreased activity tolerance;Decreased balance;Decreased mobility;Pain;Decreased skin integrity       PT Treatment Interventions DME instruction;Gait training;Functional mobility training;Therapeutic activities;Therapeutic exercise;Balance training;Patient/family education    PT Goals (Current goals can be found in the Care Plan section)  Acute Rehab PT Goals Patient Stated Goal: [place] PT Goal Formulation: With patient Potential to Achieve Goals: Fair    Frequency Min 2X/week    AM-PAC PT "6 Clicks" Mobility  Outcome Measure Help needed turning from your back to your side while in a flat bed without using bedrails?: A Little Help needed moving from lying on your back to sitting on the side of a flat bed without using bedrails?: A Little Help needed moving to and from a bed to a chair (including a wheelchair)?: Total Help needed standing up from a chair  using your arms (e.g., wheelchair or bedside chair)?: A Lot Help needed to walk in hospital room?: Total Help needed climbing 3-5 steps with a railing? : Total 6 Click Score: 11    End of Session Equipment Utilized During Treatment: Gait belt Activity Tolerance: Patient limited by pain Patient left: in bed;with call bell/phone within reach;with family/visitor present Nurse Communication: Mobility status;Other (comment)(dizziness with standing) PT Visit Diagnosis: Unsteadiness on feet (R26.81);Other abnormalities of gait and mobility (R26.89);Muscle weakness (generalized) (M62.81);Difficulty in walking, not elsewhere classified (R26.2);Pain;Dizziness and giddiness (R42) Pain - Right/Left: (bilateral) Pain - part of body: Leg    Time: 7846-9629 PT Time Calculation (min) (ACUTE ONLY): 27 min   Charges:   PT Evaluation $PT Eval Moderate Complexity: 1 Mod PT Treatments $Gait Training: 8-22 mins        Sutton Hirsch B. Migdalia Dk PT, DPT Acute Rehabilitation Services Pager [phone] Office [phone]   [place] 01/08/2019, 3:47 PM

## 2019-01-08 NOTE — NC FL2 (Signed)
[place] MEDICAID FL2 LEVEL OF CARE SCREENING TOOL     IDENTIFICATION  Patient Name: Kari [name] Birthdate: 21-Mar-1961 Sex: female Admission Date (Current Location): 12/25/2018  [place] and [place] Number:  Kathleen Argue [id number] [place] and Address:  The . [facility], [place] [address], [place], [place] [address]      Provider Number: [id number]  Attending Physician Name and Address:  Dessa Phi, DO  Relative Name and Phone Number:  Cheryllynn Sarff; sister; [phone]    Current Level of Care: Hospital Recommended Level of Care: [place] Prior Approval Number:    Date Approved/Denied:   PASRR Number: [id number]  Discharge Plan: SNF    Current Diagnoses: Patient Active Problem List   Diagnosis Date Noted  . Infected wound 12/26/2018  . Idiopathic chronic venous hypertension of both lower extremities with ulcer and inflammation ([place])   . History of fall 05/10/2018  . Hyperkalemia 02/21/2018  . Hypotension 02/21/2018  . Sepsis ([place]) 02/21/2018  . Encounter for central line placement   . Mitral regurgitation 10/30/2017  . Acute kidney injury ([place]) 07/21/2017  . Generalized weakness 07/20/2017  . Shock circulatory ([place])   . Acute respiratory failure with hypoxia ([place])   . Pulmonary edema   . Cardiac arrest ([place]) 06/26/2017  . Ventricular fibrillation ([place])   . Acute on chronic diastolic heart failure ([place])   . Abnormal ECG   . Cellulitis 04/02/2017  . AKI (acute kidney injury) ([place]) 12/17/2016  . Cellulitis and abscess of right lower extremity 12/17/2016  . Syncope and collapse 12/17/2016  . Peripheral edema 12/17/2016  . Hypokalemia 12/17/2016  . Anemia 12/17/2016  . Acute diastolic CHF (congestive heart failure) ([place]) 03/14/2014  . CHF (congestive heart failure) ([place]) 03/12/2014  . Sarcoidosis ([place]) 03/12/2014  . Severe sepsis ([place]) 10/17/2013  . ARF (acute renal failure) ([place]) 10/08/2013  . Cellulitis and abscess  10/08/2013  . Morbid obesity ([place]) 10/08/2013  . Volume depletion 10/08/2013  . Venous (peripheral) insufficiency 10/28/2012  . Mediastinal lymphadenopathy 10/16/2012  . Pulmonary nodules 10/16/2012  . 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 Chest pain 09/02/2012  . Asthma   . Hypertension   . Depression   . Venous stasis ulcers ([place]) 07/29/2012  . Varicose veins of lower extremities with ulcer ([place]) 07/08/2012  . Venous ulcer of leg ([place]) 07/08/2012    Orientation RESPIRATION BLADDER Height & Weight     Self, Situation, Place, Time  Normal Continent, External catheter Weight: 253 lb 8.5 oz (115 kg) Height:  5\' 7"  (170.2 cm)  BEHAVIORAL SYMPTOMS/MOOD NEUROLOGICAL BOWEL NUTRITION STATUS      Continent Diet  AMBULATORY STATUS COMMUNICATION OF NEEDS Skin   Extensive Assist Verbally Surgical wounds, Wound Vac(surgical incision and grafts on bilateral lower legs, prevena wound vac on each left; skin tear on buttocks with foam)                       Personal Care Assistance Level of Assistance  Bathing, Feeding, Dressing Bathing Assistance: Maximum assistance Feeding assistance: Independent Dressing Assistance: Maximum assistance     Functional Limitations Info  Sight, Hearing, Speech Sight Info: Adequate Hearing Info: Adequate Speech Info: Adequate    SPECIAL CARE FACTORS FREQUENCY  PT (By licensed PT), OT (By licensed OT)     PT Frequency: 5x week OT Frequency: 5x week            Contractures Contractures Info: Not present  Additional Factors Info  Allergies, Code Status Code Status Info: Full Code Allergies Info: TRAMADOL           Current Medications (01/08/2019):  This is the current hospital active medication list Current Facility-Administered Medications  Medication Dose Route Frequency Provider Last Rate Last Dose  . 0.9 %  sodium chloride infusion  250 mL Intravenous PRN [name], MD       . 0.9 %  sodium chloride infusion   Intravenous Continuous [name], MD      . acetaminophen (TYLENOL) tablet 1,000 mg  1,000 mg Oral Q6H PRN [name], MD   1,000 mg at 01/01/19 3570  . albuterol (PROVENTIL) (2.5 MG/3ML) 0.083% nebulizer solution 2.5 mg  2.5 mg Nebulization Q6H PRN [name], MD      . bisacodyl (DULCOLAX) suppository 10 mg  10 mg Rectal Daily PRN [name], MD      . carvedilol (COREG) tablet 6.25 mg  6.25 mg Oral BID [name], MD   6.25 mg at 01/08/19 1018  . ceFEPIme (MAXIPIME) 2 g in sodium chloride 0.9 % 100 mL IVPB  2 g Intravenous Q8H [name], MD 200 mL/hr at 01/08/19 0519 2 g at 01/08/19 0519  . docusate sodium (COLACE) capsule 100 mg  100 mg Oral BID [name], MD   100 mg at 01/08/19 1018  . enoxaparin (LOVENOX) injection 40 mg  40 mg Subcutaneous Q24H [name], MD   40 mg at 01/08/19 1018  . febuxostat (ULORIC) tablet 40 mg  40 mg Oral Daily [name], MD   40 mg at 01/08/19 1017  . furosemide (LASIX) tablet 60 mg  60 mg Oral Daily Dessa Phi, DO   60 mg at 01/08/19 1018  . HYDROcodone-acetaminophen (NORCO/VICODIN) 5-325 MG per tablet 1 tablet  1 tablet Oral Q6H PRN [name], MD   1 tablet at 01/06/19 2343  . HYDROmorphone (DILAUDID) injection 0.5-1 mg  0.5-1 mg Intravenous Q4H PRN [name], MD      . lactated ringers infusion   Intravenous Continuous [name], MD 10 mL/hr at 12/31/18 1306    . lactated ringers infusion   Intravenous Continuous [name], MD      . lactated ringers infusion   Intravenous Continuous [name], MD      . magnesium citrate solution 1 Bottle  1 Bottle Oral Once PRN [name], MD      . methocarbamol (ROBAXIN) tablet 500 mg  500 mg Oral Q6H PRN [name], MD       Or  . methocarbamol (ROBAXIN) 500 mg in dextrose 5 % 50 mL IVPB  500 mg Intravenous Q6H PRN [name], MD      . metoCLOPramide (REGLAN) tablet 5-10 mg  5-10 mg Oral Q8H PRN [name], MD       Or  . metoCLOPramide (REGLAN) injection 5-10 mg  5-10 mg Intravenous Q8H PRN [name], MD      . metroNIDAZOLE (FLAGYL) IVPB 500 mg  500 mg Intravenous Q8H [name], MD 100 mL/hr at 01/08/19 0554 500 mg at 01/08/19 0554  . morphine 2 MG/ML injection 2 mg  2 mg Intravenous Q4H PRN [name], MD   2 mg at 01/07/19 1347  . ondansetron (ZOFRAN) tablet 4 mg  4 mg Oral Q6H PRN [name], MD  Or  . ondansetron (ZOFRAN) injection 4 mg  4 mg Intravenous Q6H PRN [name], MD      . ondansetron [facility]) tablet 4 mg  4 mg Oral Q6H PRN [name], MD       Or  . ondansetron [facility]) injection 4 mg  4 mg Intravenous Q6H PRN [name], MD      . oxyCODONE (Oxy IR/ROXICODONE) immediate release tablet 10-15 mg  10-15 mg Oral Q4H PRN [name], MD   15 mg at 01/08/19 1022  . oxyCODONE (Oxy IR/ROXICODONE) immediate release tablet 5-10 mg  5-10 mg Oral Q4H PRN [name], MD      . polyethylene glycol (MIRALAX / GLYCOLAX) packet 17 g  17 g Oral Daily PRN [name], MD      . senna-docusate (Senokot-S) tablet 1 tablet  1 tablet Oral BID [name], MD   1 tablet at 01/08/19 1018  . sodium chloride flush (NS) 0.9 % injection 3 mL  3 mL Intravenous Q12H [name], MD   3 mL at 01/08/19 1019  . sodium chloride flush (NS) 0.9 % injection 3 mL  3 mL Intravenous PRN [name], MD         Discharge Medications: Please see discharge summary for a list of discharge medications.  Relevant Imaging Results:  Relevant Lab Results:   Additional Information [id number] [place], [place]

## 2019-01-08 NOTE — Plan of Care (Signed)
  Problem: Health Behavior/Discharge Planning: Goal: Ability to manage health-related needs will improve Outcome: Progressing   Problem: Clinical Measurements: Goal: Ability to maintain clinical measurements within normal limits will improve Outcome: Progressing Goal: Will remain free from infection Outcome: Progressing Goal: Diagnostic test results will improve Outcome: Progressing   Problem: Activity: Goal: Risk for activity intolerance will decrease Outcome: Progressing   Problem: Nutrition: Goal: Adequate nutrition will be maintained Outcome: Progressing   Problem: Coping: Goal: Level of anxiety will decrease Outcome: Progressing   Problem: Elimination: Goal: Will not experience complications related to bowel motility Outcome: Progressing Goal: Will not experience complications related to urinary retention Outcome: Progressing   Problem: Pain Managment: Goal: General experience of comfort will improve Outcome: Progressing   Problem: Safety: Goal: Ability to remain free from injury will improve Outcome: Progressing   Problem: Skin Integrity: Goal: Risk for impaired skin integrity will decrease Outcome: Progressing     

## 2019-01-08 NOTE — Social Work (Signed)
CSW acknowledging consult for SNF placement. Will follow for therapy recommendations.   Airam Heidecker, MSW, LCSWA [place] Clinical Social Work [phone]   

## 2019-01-08 NOTE — Social Work (Signed)
Spoke with pt and pt sister at bedside. Pt sister is in agreement for SNF placement, she prefers: [facility], [place] and [place].  Westley Hummer, MSW, [place] Work [phone]

## 2019-01-08 NOTE — Clinical Social Work Note (Signed)
Clinical Social Work Assessment  Patient Details  Name: Kari [name] MRN: [id number] Date of Birth: [birth year]  Date of referral:  01/08/19               Reason for consult:  Facility Placement, Discharge Planning                Permission sought to share information with:  Facility Sport and exercise psychologist, Family Supports Permission granted to share information::  Yes, Verbal Permission Granted  Name::     Kari [name]  Agency::  SNFs  Relationship::  sister  Contact Information:  [phone]  Housing/Transportation Living arrangements for the past 2 months:  Apartment Source of Information:  Patient Patient Interpreter Needed:  None Criminal Activity/Legal Involvement Pertinent to Current Situation/Hospitalization:  No - Comment as needed Significant Relationships:  Adult Children Lives with:  Adult Children Do you feel safe going back to the place where you live?  Yes Need for family participation in patient care:  Yes (Comment)  Care giving concerns:  Pt lives at home alone, she has a sister who helps as able. She is interested in SNF with a desire for [facility] once she returns home. Pt acknowledges that she needs assistance with bilateral wound vacs and iv abx.    Social Worker assessment / plan:  CSW met with pt at bedside. Introduced self, role, and reason for visit. Pt alert, says her legs are sore but otherwise she is feeling well. We discussed supports at home and she mentioned her sister is her main source of support. Pt is interested in rehab at discharge and wants [facility] for [facility] when she returns home.   Pt sister will be by the room at 2pm and I will revisit with pt.  Employment status:  Disabled (Comment on whether or not currently receiving Disability) Insurance information:  Medicare PT Recommendations:  Not assessed at this time Information / Referral to community resources:  [place]  Patient/Family's Response to care:  Pt states  understanding of recommendations, she is interested in SNF and amenable to working with [facility].  Patient/Family's Understanding of and Emotional Response to Diagnosis, Current Treatment, and Prognosis:  Pt states understanding of diagnosis, current treatment and prognosis. Pt interested in SNF and would like to improve and return home. Pt emotionally appropriate and positive throughout assessment.  Emotional Assessment Appearance:  Appears stated age Attitude/Demeanor/Rapport:  Engaged, Gracious Affect (typically observed):  Accepting, Adaptable, Appropriate, Pleasant Orientation:  Oriented to Self, Oriented to Situation, Oriented to  Time, Oriented to Place Alcohol / Substance use:  Not Applicable Psych involvement (Current and /or in the community):  No (Comment)  Discharge Needs  Concerns to be addressed:  Care Coordination Readmission within the last 30 days:  No Current discharge risk:  Dependent with Mobility, Lives alone, Physical Impairment Barriers to Discharge:  Continued Medical Work up   [employer], [place] 01/08/2019, 12:59 PM

## 2019-01-09 DIAGNOSIS — D869 Sarcoidosis, unspecified: Secondary | ICD-10-CM | POA: Diagnosis not present

## 2019-01-09 DIAGNOSIS — N183 Chronic kidney disease, stage 3 (moderate): Secondary | ICD-10-CM | POA: Diagnosis not present

## 2019-01-09 DIAGNOSIS — R2681 Unsteadiness on feet: Secondary | ICD-10-CM | POA: Diagnosis not present

## 2019-01-09 DIAGNOSIS — M6281 Muscle weakness (generalized): Secondary | ICD-10-CM | POA: Diagnosis not present

## 2019-01-09 DIAGNOSIS — R05 Cough: Secondary | ICD-10-CM | POA: Diagnosis not present

## 2019-01-09 DIAGNOSIS — L03119 Cellulitis of unspecified part of limb: Secondary | ICD-10-CM | POA: Diagnosis not present

## 2019-01-09 DIAGNOSIS — Z20828 Contact with and (suspected) exposure to other viral communicable diseases: Secondary | ICD-10-CM | POA: Diagnosis not present

## 2019-01-09 DIAGNOSIS — Z7401 Bed confinement status: Secondary | ICD-10-CM | POA: Diagnosis not present

## 2019-01-09 DIAGNOSIS — D508 Other iron deficiency anemias: Secondary | ICD-10-CM | POA: Diagnosis not present

## 2019-01-09 DIAGNOSIS — B9689 Other specified bacterial agents as the cause of diseases classified elsewhere: Secondary | ICD-10-CM | POA: Diagnosis not present

## 2019-01-09 DIAGNOSIS — L03116 Cellulitis of left lower limb: Secondary | ICD-10-CM | POA: Diagnosis not present

## 2019-01-09 DIAGNOSIS — Z48817 Encounter for surgical aftercare following surgery on the skin and subcutaneous tissue: Secondary | ICD-10-CM | POA: Diagnosis not present

## 2019-01-09 DIAGNOSIS — I5022 Chronic systolic (congestive) heart failure: Secondary | ICD-10-CM | POA: Diagnosis not present

## 2019-01-09 DIAGNOSIS — J45909 Unspecified asthma, uncomplicated: Secondary | ICD-10-CM | POA: Diagnosis not present

## 2019-01-09 DIAGNOSIS — D638 Anemia in other chronic diseases classified elsewhere: Secondary | ICD-10-CM | POA: Diagnosis not present

## 2019-01-09 DIAGNOSIS — R918 Other nonspecific abnormal finding of lung field: Secondary | ICD-10-CM | POA: Diagnosis not present

## 2019-01-09 DIAGNOSIS — M1A9XX0 Chronic gout, unspecified, without tophus (tophi): Secondary | ICD-10-CM | POA: Diagnosis not present

## 2019-01-09 DIAGNOSIS — I10 Essential (primary) hypertension: Secondary | ICD-10-CM | POA: Diagnosis not present

## 2019-01-09 DIAGNOSIS — M1A0790 Idiopathic chronic gout, unspecified ankle and foot, without tophus (tophi): Secondary | ICD-10-CM | POA: Diagnosis not present

## 2019-01-09 DIAGNOSIS — R2689 Other abnormalities of gait and mobility: Secondary | ICD-10-CM | POA: Diagnosis not present

## 2019-01-09 DIAGNOSIS — L03115 Cellulitis of right lower limb: Secondary | ICD-10-CM | POA: Diagnosis not present

## 2019-01-09 DIAGNOSIS — M2550 Pain in unspecified joint: Secondary | ICD-10-CM | POA: Diagnosis not present

## 2019-01-09 DIAGNOSIS — D500 Iron deficiency anemia secondary to blood loss (chronic): Secondary | ICD-10-CM | POA: Diagnosis not present

## 2019-01-09 DIAGNOSIS — R41841 Cognitive communication deficit: Secondary | ICD-10-CM | POA: Diagnosis not present

## 2019-01-09 DIAGNOSIS — I872 Venous insufficiency (chronic) (peripheral): Secondary | ICD-10-CM | POA: Diagnosis not present

## 2019-01-09 DIAGNOSIS — I130 Hypertensive heart and chronic kidney disease with heart failure and stage 1 through stage 4 chronic kidney disease, or unspecified chronic kidney disease: Secondary | ICD-10-CM | POA: Diagnosis not present

## 2019-01-09 LAB — CBC
HCT: 29.7 % — ABNORMAL LOW (ref 36.0–46.0)
Hemoglobin: 8.8 g/dL — ABNORMAL LOW (ref 12.0–15.0)
MCH: 27.9 pg (ref 26.0–34.0)
MCHC: 29.6 g/dL — ABNORMAL LOW (ref 30.0–36.0)
MCV: 94.3 fL (ref 80.0–100.0)
Platelets: 274 10*3/uL (ref 150–400)
RBC: 3.15 MIL/uL — ABNORMAL LOW (ref 3.87–5.11)
RDW: 20 % — ABNORMAL HIGH (ref 11.5–15.5)
WBC: 9.9 10*3/uL (ref 4.0–10.5)
nRBC: 0 % (ref 0.0–0.2)

## 2019-01-09 LAB — BASIC METABOLIC PANEL
Anion gap: 8 (ref 5–15)
BUN: 36 mg/dL — ABNORMAL HIGH (ref 6–20)
CO2: 25 mmol/L (ref 22–32)
Calcium: 9.1 mg/dL (ref 8.9–10.3)
Chloride: 108 mmol/L (ref 98–111)
Creatinine, Ser: 1.58 mg/dL — ABNORMAL HIGH (ref 0.44–1.00)
GFR calc Af Amer: 42 mL/min — ABNORMAL LOW (ref >60)
GFR calc non Af Amer: 36 mL/min — ABNORMAL LOW (ref >60)
Glucose, Bld: 96 mg/dL (ref 70–99)
Potassium: 3.4 mmol/L — ABNORMAL LOW (ref 3.5–5.1)
Sodium: 141 mmol/L (ref 135–145)

## 2019-01-09 MED ORDER — POLYETHYLENE GLYCOL 3350 17 G PO PACK: 17.0000 g | PACK | Freq: Every day | ORAL | Status: DC

## 2019-01-09 MED ORDER — OXYCODONE HCL 5 MG PO TABS: 5.0000 mg | ORAL_TABLET | ORAL | 0 refills | Status: DC | PRN

## 2019-01-09 MED ORDER — GLYCERIN (LAXATIVE) 2.1 G RE SUPP
1.0000 | RECTAL | Status: DC | PRN
  Filled 2019-01-09: qty 1

## 2019-01-09 MED ORDER — POTASSIUM CHLORIDE CRYS ER 20 MEQ PO TBCR
40.0000 meq | EXTENDED_RELEASE_TABLET | Freq: Once | ORAL | Status: AC
  Administered 2019-01-09: 40 meq via ORAL
  Filled 2019-01-09: qty 2

## 2019-01-09 NOTE — Progress Notes (Signed)
Patient will DC to: [facility] Anticipated DC date: 01/09/2019 Family notified: Yes Transport by: Corey Harold   Per MD patient ready for DC to . RN, patient, patient's family, and facility notified of DC. Discharge Summary and FL2 sent to facility. RN to call report prior to discharge [phone]). DC packet on chart. Ambulance transport requested for patient.   CSW will sign off for now as social work intervention is no longer needed. Please consult [place] again if new needs arise.  Cristi Gwynn, LCSW-A [facility]/Clinical Social Work Department Cell: [phone]

## 2019-01-09 NOTE — Clinical Social Work Placement (Signed)
Nurse to call report to [phone] and patient will be reporting to room 108. PTAR has been scheduled to pick up at 4:00pm.   CLINICAL SOCIAL WORK PLACEMENT  NOTE  Date:  01/09/2019  Patient Details  Name: Kari [name] MRN: [id number] Date of Birth: 12-29-1960  Clinical Social Work is seeking post-discharge placement for this patient at the [place] level of care (*CSW will initial, date and re-position this form in  chart as items are completed):  Yes   Patient/family provided with [place] Work Department's list of facilities offering this level of care within the geographic area requested by the patient (or if unable, by the patient's family).  Yes   Patient/family informed of their freedom to choose among providers that offer the needed level of care, that participate in Medicare, Medicaid or managed care program needed by the patient, have an available bed and are willing to accept the patient.  Yes   Patient/family informed of [place]'s ownership interest in [facility] and [facility], as well as of the fact that they are under no obligation to receive care at these facilities.  PASRR submitted to EDS on       PASRR number received on 01/08/19     Existing PASRR number confirmed on       FL2 transmitted to all facilities in geographic area requested by pt/family on 01/08/19     FL2 transmitted to all facilities within larger geographic area on       Patient informed that his/her managed care company has contracts with or will negotiate with certain facilities, including the following:        Yes   Patient/family informed of bed offers received.  Patient chooses bed at [place] recommends and patient chooses bed at      Patient to be transferred to [facility] on  .  Patient to be transferred to facility by PTAR     Patient family notified on 01/09/19 of  transfer.  Name of family member notified:  Gaylene Brooks     PHYSICIAN       Additional Comment:    _______________________________________________ Gelene Mink, [place] 01/09/2019, 2:03 PM

## 2019-01-09 NOTE — Progress Notes (Signed)
Subjective: 2 Days Post-Op Procedure(s) (LRB): SPLIT THICKNESS SKIN GRAFT BILATERAL LEGS, APPLY VAC (Bilateral) Patient reports pain as mild.    Objective: Vital signs in last 24 hours: Temp:  [98.4 F (36.9 C)-98.7 F (37.1 C)] 98.4 F (36.9 C) (01/17 0619) Pulse Rate:  [66-69] 66 (01/17 0619) Resp:  [16-17] 17 (01/17 0619) BP: (95-113)/(54-71) 113/71 (01/17 0619) SpO2:  [99 %-100 %] 100 % (01/17 0619)  Intake/Output from previous day: 01/16 0701 - 01/17 0700 In: 646.1 [P.O.:480; IV Piggyback:166.1] Out: 400 [Urine:400] Intake/Output this shift: No intake/output data recorded.  Recent Labs    01/07/19 0234 01/08/19 0304 01/09/19 0620  HGB 8.6* 8.9* 8.8*   Recent Labs    01/08/19 0304 01/09/19 0620  WBC 12.4* 9.9  RBC 3.24* 3.15*  HCT 29.7* 29.7*  PLT 286 274   Recent Labs    01/08/19 0304 01/09/19 0620  NA 139 141  K 4.0 3.4*  CL 107 108  CO2 24 25  BUN 31* 36*  CREATININE 1.49* 1.58*  GLUCOSE 159* 96  CALCIUM 9.1 9.1   No results for input(s): LABPT, INR in the last 72 hours.  VAC dressings in place to bilateral lower legs and functioning well.     Assessment/Plan: 2 Days Post-Op Procedure(s) (LRB): SPLIT THICKNESS SKIN GRAFT BILATERAL LEGS, APPLY VAC (Bilateral) Patient reports going to SNF rehab after DC. Will need bilateral Prevena VAC at discharge follow up in office in 1 week following DC.    SHAWN RAYBURN 01/09/2019, 8:25 AM  [employer] [phone]

## 2019-01-09 NOTE — Progress Notes (Signed)
Patient discharged to SNF in stable condition. PTAR to transport, all questions answered, discharge packet given.

## 2019-01-09 NOTE — Discharge Summary (Addendum)
Physician Discharge Summary  Kari [name] ZTI:[id number] DOB: 1961-12-12 DOA: 12/25/2018  PCP: Rutherford Guys, MD  Admit date: 12/25/2018 Discharge date: 01/09/2019  Admitted From: Home Disposition: Skilled nursing facility  Recommendations for Outpatient Follow-up:  1. Follow up with PCP in 1 week 2. Follow up with Dr. Sharol Given in 1 week 3. Weightbearing: Weightbearing as tolerated bilateral lower extremities 4. Dressing care/ Wound VAC: Continue wound VAC for total of 2 weeks  Discharge Condition: Stable CODE STATUS: Code Diet recommendation: Heart healthy  Brief/Interim Summary: Kari [name] is a 58 y.o. year old female with medical history significant for CHF with reduced ejection fraction status post ICD placementfor secondary prevention (history of resuscitated sudden death), hypertension, CKD stage III, morbid obesity, chronic venous stasis ulcers who presented on 12/25/2018 with several days of worsening drainage and foul smell from lower extremity leg ulcers and was found to have purulent cellulitis bilateral lower extremities related to ulcerations requiring empiric IV antibiotics as well as multiple debridements (1/8, 1/10) by Dr. Sharol Given to attempt limb salvage surgically. She underwent skin graft 1/15.   Purulent cellulitis of bilateral lower extremities due to chronic venous stasis ulcerations -Patient underwent debridement of legs (1/8, 1/10) in attempt for limb salvage intervention by Dr. Sharol Given with orthopedics.  -S/p split thickness skin graft 1/15  -1 of 2 blood cultures growing micrococcus species which is likely contaminant -Completed 14-day course of antibiotics IV -Follow-up with [facility] in 1 week -Continue bilateral Pravena VAC  Chronic systolicCHF -EF 82-50%, 04/3975, ICD placementfor secondary prevention -Patient remains euvolemic on exam -Continue home Coreg, lasix   CKD stage III -Creatinine remained stable at baseline (range creatinine  1.3-1.4)  Chronic normocytic anemia -Most likely anemia of chronic disease related to CKD? S/p 1u pRBC on 1/13. Hgb stable   Sarcoidosis -Stable   Discharge Instructions  Discharge Instructions    (HEART FAILURE PATIENTS) Call MD:  Anytime you have any of the following symptoms: 1) 3 pound weight gain in 24 hours or 5 pounds in 1 week 2) shortness of breath, with or without a dry hacking cough 3) swelling in the hands, feet or stomach 4) if you have to sleep on extra pillows at night in order to breathe.   Complete by:  As directed    Call MD for:  difficulty breathing, headache or visual disturbances   Complete by:  As directed    Call MD for:  extreme fatigue   Complete by:  As directed    Call MD for:  hives   Complete by:  As directed    Call MD for:  persistant dizziness or light-headedness   Complete by:  As directed    Call MD for:  persistant nausea and vomiting   Complete by:  As directed    Call MD for:  severe uncontrolled pain   Complete by:  As directed    Call MD for:  temperature >100.4   Complete by:  As directed    Diet - low sodium heart healthy   Complete by:  As directed    Increase activity slowly   Complete by:  As directed      Allergies as of 01/09/2019      Reactions   Tramadol Other (See Comments)   hallucination      Medication List    STOP taking these medications   HYDROcodone-acetaminophen 5-325 MG tablet Commonly known as:  NORCO/VICODIN   SANTYL ointment Generic drug:  collagenase  TAKE these medications   acetaminophen 500 MG tablet Commonly known as:  TYLENOL Take 1,000 mg by mouth every 6 (six) hours as needed for mild pain.   albuterol 108 (90 Base) MCG/ACT inhaler Commonly known as:  PROVENTIL HFA;VENTOLIN HFA Inhale 2 puffs into the lungs every 6 (six) hours as needed for shortness of breath.   aspirin EC 81 MG tablet Take 81 mg by mouth daily.   carvedilol 6.25 MG tablet Commonly known as:  COREG Take 6.25 mg  by mouth 2 (two) times daily.   docusate sodium 100 MG capsule Commonly known as:  COLACE Take 100 mg by mouth daily.   febuxostat 40 MG tablet Commonly known as:  ULORIC Take 40 mg by mouth daily.   furosemide 40 MG tablet Commonly known as:  LASIX Take 60 mg by mouth daily.   loratadine 10 MG tablet Commonly known as:  CLARITIN Take 10 mg by mouth daily as needed for allergies.   oxyCODONE 5 MG immediate release tablet Commonly known as:  Oxy IR/ROXICODONE Take 1-2 tablets (5-10 mg total) by mouth every 4 (four) hours as needed for up to 7 days for moderate pain.   Vitamin D 50 MCG (2000 UT) Caps Take 2,000 Units by mouth daily.      Follow-up Information    [name], MD In 1 week.   Specialty:  Orthopedic Surgery Contact information: [address] [place] [address] [phone]          Allergies  Allergen Reactions  . Tramadol Other (See Comments)    hallucination    Consultations:  Orthopedic surgery   Discharge Exam: Vitals:   01/08/19 2132 01/09/19 0619  BP: (!) 95/54 113/71  Pulse: 67 66  Resp: 16 17  Temp: 98.7 F (37.1 C) 98.4 F (36.9 C)  SpO2: 99% 100%    General: Pt is alert, awake, not in acute distress Cardiovascular: RRR, S1/S2 +, no rubs, no gallops Respiratory: CTA bilaterally, no wheezing, no rhonchi Abdominal: Soft, NT, ND, bowel sounds + Extremities: no edema, no cyanosis, bilateral lower extremities wrapped, wound VAC in place    The results of significant diagnostics from this hospitalization (including imaging, microbiology, ancillary and laboratory) are listed below for reference.     Microbiology: Recent Results (from the past 240 hour(s))  Surgical pcr screen     Status: None   Collection Time: 12/30/18  7:01 PM  Result Value Ref Range Status   MRSA, PCR NEGATIVE NEGATIVE Final   Staphylococcus aureus NEGATIVE NEGATIVE Final    Comment: (NOTE) The Xpert SA Assay (FDA approved for NASAL  specimens in patients 9 years of age and older), is one component of a comprehensive surveillance program. It is not intended to diagnose infection nor to guide or monitor treatment. Performed at [facility], [place] [address], [place], [place] [address]      Labs: BNP (last 3 results) No results for input(s): BNP in the last 8760 hours. Basic Metabolic Panel: Recent Labs  Lab 01/05/19 1445 01/06/19 0041 01/06/19 1530 01/07/19 0234 01/08/19 0304 01/09/19 0620  NA  --  136 141 140 139 141  K  --  3.0* 4.8 4.2 4.0 3.4*  CL  --  101 108 106 107 108  CO2  --  27 26 25 24 25   GLUCOSE  --  134* 99 89 159* 96  BUN  --  24* 23* 25* 31* 36*  CREATININE  --  1.33* 1.30*  1.20* 1.49* 1.58*  CALCIUM  --  8.8* 9.2 9.2 9.1 9.1  MG 2.4  --   --   --   --   --    Liver Function Tests: No results for input(s): AST, ALT, ALKPHOS, BILITOT, PROT, ALBUMIN in the last 168 hours. No results for input(s): LIPASE, AMYLASE in the last 168 hours. No results for input(s): AMMONIA in the last 168 hours. CBC: Recent Labs  Lab 01/05/19 0517 01/06/19 0041 01/07/19 0234 01/08/19 0304 01/09/19 0620  WBC 11.0* 10.5 9.1 12.4* 9.9  HGB 7.5* 8.7* 8.6* 8.9* 8.8*  HCT 26.2* 29.3* 29.8* 29.7* 29.7*  MCV 92.3 91.6 92.8 91.7 94.3  PLT 313 282 276 286 274   Cardiac Enzymes: No results for input(s): CKTOTAL, CKMB, CKMBINDEX, TROPONINI in the last 168 hours. BNP: Invalid input(s): POCBNP CBG: No results for input(s): GLUCAP in the last 168 hours. D-Dimer No results for input(s): DDIMER in the last 72 hours. Hgb A1c No results for input(s): HGBA1C in the last 72 hours. Lipid Profile No results for input(s): CHOL, HDL, LDLCALC, TRIG, CHOLHDL, LDLDIRECT in the last 72 hours. Thyroid function studies No results for input(s): TSH, T4TOTAL, T3FREE, THYROIDAB in the last 72 hours.  Invalid input(s): FREET3 Anemia work up No results for input(s): VITAMINB12, FOLATE, FERRITIN, TIBC, IRON, RETICCTPCT  in the last 72 hours. Urinalysis    Component Value Date/Time   COLORURINE YELLOW 02/20/2018 2357   APPEARANCEUR HAZY (A) 02/20/2018 2357   LABSPEC 1.015 02/20/2018 2357   PHURINE 5.0 02/20/2018 2357   GLUCOSEU NEGATIVE 02/20/2018 2357   HGBUR SMALL (A) 02/20/2018 2357   BILIRUBINUR NEGATIVE 02/20/2018 2357   KETONESUR NEGATIVE 02/20/2018 2357   PROTEINUR NEGATIVE 02/20/2018 2357   UROBILINOGEN 0.2 04/10/2014 0949   NITRITE NEGATIVE 02/20/2018 2357   LEUKOCYTESUR LARGE (A) 02/20/2018 2357   Sepsis Labs Invalid input(s): PROCALCITONIN,  WBC,  LACTICIDVEN Microbiology Recent Results (from the past 240 hour(s))  Surgical pcr screen     Status: None   Collection Time: 12/30/18  7:01 PM  Result Value Ref Range Status   MRSA, PCR NEGATIVE NEGATIVE Final   Staphylococcus aureus NEGATIVE NEGATIVE Final    Comment: (NOTE) The Xpert SA Assay (FDA approved for NASAL specimens in patients 8 years of age and older), is one component of a comprehensive surveillance program. It is not intended to diagnose infection nor to guide or monitor treatment. Performed at [facility], [place] [address] [place], [place] [address]      Patient was seen and examined on the day of discharge and was found to be in stable condition. Time coordinating discharge: 45 minutes including assessment and coordination of care, as well as examination of the patient.   SIGNED:  Dessa Phi, DO [facility] [email or web address] 01/09/2019, 1:28 PM

## 2019-01-09 NOTE — Progress Notes (Signed)
Physical Therapy Treatment Patient Details Name: Kari [name] MRN: [id number] DOB: 19-Dec-1961 Today's Date: 01/09/2019    History of Present Illness 58 y.o. year old female with medical history significant for CHF with reduced ejection fraction status post ICD placement for secondary prevention ( history of resuscitated sudden death), HTN, CKD stage III, morbid obesity, chronic venous stasis ulcers who presented on 12/25/2018 with several days of worsening drainage and foul smell from LE leg ulcers and was found to have purulent cellulitis BLE related to ulcerations requiring empiric IV antibiotics as well as multiple debridements by Dr. Sharol Given to attempt limb salvage surgically. Pt s/p SPLIT THICKNESS SKIN GRAFT BILATERAL LEGS, APPLY 2 VACs 01/07/19.    PT Comments    Patient progressing towards goals. Patient had dizziness upon standing but able to complete stand pivot transfer with minA +2 to chair with RW. Patient eager to participate in therapy to be able to return to [place].  Current recommendation appropriate. Patient will continue to benefit from acute care therapy services to maximize independence and safety for functional mobility.   Follow Up Recommendations  SNF     Equipment Recommendations  Other (comment)(TBD at next venue)    Recommendations for Other Services       Precautions / Restrictions Precautions Precautions: Fall Precaution Comments: bilateral LE Wound vacs Restrictions Weight Bearing Restrictions: No RLE Weight Bearing: Weight bearing as tolerated LLE Weight Bearing: Weight bearing as tolerated    Mobility  Bed Mobility Overal bed mobility: Needs Assistance Bed Mobility: Supine to Sit     Supine to sit: Supervision     General bed mobility comments: supervision for safety to come to EOB. Increased time needed to achieve position. Dizziness when sitting EOB that decreased with seated rest  Transfers Overall transfer level: Needs  assistance Equipment used: Rolling walker (2 wheeled) Transfers: Sit to/from [facility] Sit to Stand: Min assist;Mod assist;+2 safety/equipment Stand pivot transfers: Min assist;+2 safety/equipment       General transfer comment: Required min-mod A +2 for lift assist and steadying from EOB with RW for patient safety and equipment management. Cues to place feet inside RW. Mild dizziness in standing. MinA +2 for steadying and safety during stand pivot transfer to chair. Cues to sit down once back of legs touched chair.  Ambulation/Gait                 Stairs             Wheelchair Mobility    Modified Rankin (Stroke Patients Only)       Balance Overall balance assessment: Needs assistance Sitting-balance support: Feet supported;No upper extremity supported Sitting balance-Leahy Scale: Fair     Standing balance support: Bilateral upper extremity supported Standing balance-Leahy Scale: Poor Standing balance comment: reliant on BUE support                            Cognition Arousal/Alertness: Awake/alert Behavior During Therapy: WFL for tasks assessed/performed Overall Cognitive Status: Within Functional Limits for tasks assessed                                        Exercises      General Comments General comments (skin integrity, edema, etc.): Patient family member in room.       Pertinent Vitals/Pain Pain Assessment: 0-10 Pain Score: 7  Pain Location: bilateral LE Pain Descriptors / Indicators: Aching Pain Intervention(s): Limited activity within patient's tolerance;Monitored during session    Home Living                      Prior Function            PT Goals (current goals can now be found in the care plan section) Acute Rehab PT Goals Patient Stated Goal: get back to the casino PT Goal Formulation: With patient Potential to Achieve Goals: Fair Progress towards PT goals: Progressing  toward goals    Frequency    Min 2X/week      PT Plan Current plan remains appropriate    Co-evaluation              AM-PAC PT "6 Clicks" Mobility   Outcome Measure  Help needed turning from your back to your side while in a flat bed without using bedrails?: None Help needed moving from lying on your back to sitting on the side of a flat bed without using bedrails?: None Help needed moving to and from a bed to a chair (including a wheelchair)?: A Lot Help needed standing up from a chair using your arms (e.g., wheelchair or bedside chair)?: A Lot Help needed to walk in hospital room?: A Lot Help needed climbing 3-5 steps with a railing? : A Lot 6 Click Score: 16    End of Session Equipment Utilized During Treatment: Gait belt Activity Tolerance: Patient limited by pain Patient left: in chair;with call bell/phone within reach;with family/visitor present Nurse Communication: Mobility status PT Visit Diagnosis: Other abnormalities of gait and mobility (R26.89);Muscle weakness (generalized) (M62.81);Difficulty in walking, not elsewhere classified (R26.2);Pain Pain - Right/Left: (bilateral) Pain - part of body: Leg     Time: 4627-0350 PT Time Calculation (min) (ACUTE ONLY): 21 min  Charges:  $Therapeutic Exercise: 8-22 mins                     Erick Blinks, SPT   Erick Blinks 01/09/2019, 1:54 PM

## 2019-01-11 LAB — CUP PACEART REMOTE DEVICE CHECK
Battery Remaining Longevity: 77 mo
Battery Remaining Percentage: 76 %
Battery Voltage: 2.98 V
Brady Statistic RV Percent Paced: 1 %
Date Time Interrogation Session: 20191217173953
HighPow Impedance: 68 Ohm
HighPow Impedance: 68 Ohm
Implantable Lead Implant Date: 20180713
Implantable Lead Location: 753860
Implantable Pulse Generator Implant Date: 20180713
Lead Channel Impedance Value: 380 Ohm
Lead Channel Pacing Threshold Amplitude: 0.5 V
Lead Channel Pacing Threshold Pulse Width: 0.5 ms
Lead Channel Sensing Intrinsic Amplitude: 5.6 mV
Lead Channel Setting Pacing Amplitude: 2.5 V
Lead Channel Setting Pacing Pulse Width: 0.5 ms
Lead Channel Setting Sensing Sensitivity: 0.5 mV
Pulse Gen Serial Number: 7302172

## 2019-01-12 ENCOUNTER — Non-Acute Institutional Stay (SKILLED_NURSING_FACILITY): Payer: Medicare Other | Admitting: Adult Health

## 2019-01-12 ENCOUNTER — Encounter: Payer: Self-pay | Admitting: Adult Health

## 2019-01-12 DIAGNOSIS — I10 Essential (primary) hypertension: Secondary | ICD-10-CM

## 2019-01-12 DIAGNOSIS — D869 Sarcoidosis, unspecified: Secondary | ICD-10-CM | POA: Diagnosis not present

## 2019-01-12 DIAGNOSIS — I5022 Chronic systolic (congestive) heart failure: Secondary | ICD-10-CM

## 2019-01-12 DIAGNOSIS — D638 Anemia in other chronic diseases classified elsewhere: Secondary | ICD-10-CM

## 2019-01-12 DIAGNOSIS — N183 Chronic kidney disease, stage 3 unspecified: Secondary | ICD-10-CM

## 2019-01-12 DIAGNOSIS — L03119 Cellulitis of unspecified part of limb: Secondary | ICD-10-CM | POA: Diagnosis not present

## 2019-01-12 NOTE — Progress Notes (Signed)
Location:  [facility] Room Number: 108-A Place of Service:  SNF (31) Provider:  Durenda Age, NP  Patient Care Team: Rutherford Guys, MD as PCP - General (Family Medicine) Lendon Colonel, NP as Nurse Practitioner (Nurse Practitioner) Rosita Fire, MD as Consulting Physician (Nephrology)  Extended Emergency Contact Information Primary Emergency Contact: [place], [place] Home Phone: [phone] Relation: Sister  Code Status:  Full Code  Goals of care: Advanced Directive information Advanced Directives 01/07/2019  Does Patient Have a Medical Advance Directive? No  Type of Advance Directive -  Does patient want to make changes to medical advance directive? -  Would patient like information on creating a medical advance directive? No - Patient declined  Pre-existing out of facility DNR order (yellow form or pink MOST form) -    Chief Complaint  Patient presents with  . Acute Visit    Hospital followup, status post admission at [facility] 1/2-1/17/20 venous stasis of bilateral lower extremities    HPI:  Pt is a 58 y.o. female seen today for hospital followup.  She was admitted to [place] on 12/30/2018 for short-term rehabilitation, status post hospitalization at [facility] 1/2-1/17/2020 for purulent cellulitis of bilateral lower extremities. She had debridement debridements on 1/8 and 1/10 by Dr. Sharol Given to attempt limb salvage. She is S/P split thickness skin graft 1/15.  She completed 14-day course of antibiotics IV. She had blood transfusion of 1 unit PRBC on 1/13, hgb down to 7.2. She has a PMH of CHF with reduced EF, HTN, stage III CKD, and morbid obesity.   Past Medical History:  Diagnosis Date  . Asthma   . CHF (congestive heart failure) ([place]) 03/12/2014   a. EF 40-45% by echo in 06/2017 with normal cors by cath. ICD placed following VT arrest  . Depression   . Headache(784.0)   . Hyperlipidemia   . Hypertension   .  Lung nodules   . Renal disorder   . Sarcoidosis   . Ulcer    recurring, from chronic venous insufficiency   Past Surgical History:  Procedure Laterality Date  . cataract surgery Left 07-2013  . I&D EXTREMITY Bilateral 12/31/2018   Procedure: DEBRIDEMENT BILATERAL LEGS, APPLY VAC X 2;  Surgeon: [name], MD;  Location: [place];  Service: Orthopedics;  Laterality: Bilateral;  . I&D EXTREMITY Bilateral 01/02/2019   Procedure: REPEAT DEBRIDEMENT BILATERAL LEGS, APPLY VAC X 2;  Surgeon: [name], MD;  Location: [place];  Service: Orthopedics;  Laterality: Bilateral;  . ICD IMPLANT N/A 07/05/2017   Procedure: ICD Implant;  Surgeon: Constance Haw, MD;  Location: [place] [facility];  Service: Cardiovascular;  Laterality: N/A;  . LEFT HEART CATH AND CORONARY ANGIOGRAPHY N/A 07/03/2017   Procedure: Left Heart Cath and Coronary Angiography;  Surgeon: Belva Crome, MD;  Location: [place] [facility];  Service: Cardiovascular;  Laterality: N/A;  . SKIN SPLIT GRAFT Bilateral 01/07/2019   Procedure: SPLIT THICKNESS SKIN GRAFT BILATERAL LEGS, APPLY VAC;  Surgeon: [name], MD;  Location: [place];  Service: Orthopedics;  Laterality: Bilateral;    Allergies  Allergen Reactions  . Tramadol Other (See Comments)    hallucination    Outpatient Encounter Medications as of 01/12/2019  Medication Sig  . acetaminophen (TYLENOL) 500 MG tablet Take 1,000 mg by mouth every 6 (six) hours as needed for mild pain.  Marland Kitchen albuterol (PROVENTIL HFA;VENTOLIN HFA) 108 (90 Base) MCG/ACT inhaler Inhale 2 puffs into the lungs  every 6 (six) hours as needed for shortness of breath.  Marland Kitchen aspirin EC 81 MG tablet Take 81 mg by mouth daily.  . carvedilol (COREG) 6.25 MG tablet Take 6.25 mg by mouth 2 (two) times daily.  . Cholecalciferol (VITAMIN D) 50 MCG (2000 UT) CAPS Take 2,000 Units by mouth daily.  Marland Kitchen docusate sodium (COLACE) 100 MG capsule Take 100 mg by mouth daily.  . febuxostat (ULORIC) 40 MG tablet Take 40 mg  by mouth daily.  . furosemide (LASIX) 40 MG tablet Take 60 mg by mouth daily. Take 1-1/2 tablets to = 60 mg  . loratadine (CLARITIN) 10 MG tablet Take 10 mg by mouth daily as needed for allergies.   Marland Kitchen oxycodone (OXY-IR) 5 MG capsule Take 5 mg by mouth every 4 (four) hours as needed for pain.  . [DISCONTINUED] oxyCODONE (OXY IR/ROXICODONE) 5 MG immediate release tablet Take 1-2 tablets (5-10 mg total) by mouth every 4 (four) hours as needed for up to 7 days for moderate pain.   No facility-administered encounter medications on file as of 01/12/2019.     Review of Systems  GENERAL: No change in appetite, no fatigue, no weight changes, no fever, chills or weakness MOUTH and THROAT: Denies oral discomfort, gingival pain or bleeding, pain from teeth or hoarseness   RESPIRATORY: no cough, SOB, DOE, wheezing, hemoptysis CARDIAC: No chest pain, edema or palpitations GI: No abdominal pain, diarrhea, constipation, heart burn, nausea or vomiting NEUROLOGICAL: Denies dizziness, syncope, numbness, or headache PSYCHIATRIC: Denies feelings of depression or anxiety. No report of hallucinations, insomnia, paranoia, or agitation.   Immunization History  Administered Date(s) Administered  . Influenza Split 09/04/2012  . Influenza,inj,Quad PF,6+ Mos 09/11/2018  . Influenza-Unspecified 10/24/2016  . Pneumococcal Polysaccharide-23 06/22/2012, 07/21/2017   Pertinent  Health Maintenance Due  Topic Date Due  . PAP SMEAR-Modifier  02/09/1983  . MAMMOGRAM  02/10/2012  . COLONOSCOPY  02/10/2012  . INFLUENZA VACCINE  Completed   Fall Risk  09/11/2018 05/10/2018 03/24/2018 03/04/2018 02/06/2018  Falls in the past year? No Yes Yes No No  Number falls in past yr: - 2 or more 1 - -  Comment - - patient stepped into hole outside apartment - -  Injury with Fall? - No Yes - -  Comment - - soft tissue injury resolved without intervention - -  Risk for fall due to : - - History of fall(s);Impaired mobility - -  Risk for  fall due to: Comment - - - - -  Follow up - - Falls prevention discussed - -    Vitals:   01/12/19 0925  BP: 132/78  Pulse: 64  Resp: 18  Temp: (!) 97 F (36.1 C)  TempSrc: Oral  SpO2: 100%  Weight: 253 lb 8.5 oz (115 kg)  Height: 5\' 7"  (1.702 m)   Body mass index is 39.71 kg/m.  Physical Exam  GENERAL APPEARANCE: Well nourished. In no acute distress. Obese SKIN:  Bilateral lower legs with Pravena VAC MOUTH and THROAT: Lips are without lesions. Oral mucosa is moist and without lesions. Tongue is normal in shape, size, and color and without lesions RESPIRATORY: Breathing is even & unlabored, BS CTAB CARDIAC: RRR, no murmur,no extra heart sounds, no edema, left chest defibrillator GI: Abdomen soft, normal BS, no masses, no tenderness EXTREMITIES:  Able to move X 4 extremities NEUROLOGICAL: There is no tremor. Speech is clear. Alert and oriented X 3. PSYCHIATRIC:  Affect and behavior are appropriate   Labs reviewed: Recent Labs  02/21/18 1540  02/22/18 0521  02/24/18 0500  12/27/18 0243  01/02/19 0541  01/05/19 1445  01/07/19 0234 01/08/19 0304 01/09/19 0620  NA 138   < > 145   < > 142   < > 137   < > 138   < >  --    < > 140 139 141  K 5.3*   < > 4.3   < > 3.9   < > 2.9*   < > 3.0*   < >  --    < > 4.2 4.0 3.4*  CL 115*   < > 116*   < > 114*   < > 103   < > 99   < >  --    < > 106 107 108  CO2 17*   < > 22   < > 25   < > 27   < > 31   < >  --    < > 25 24 25   GLUCOSE 107*   < > 122*   < > 102*   < > 113*   < > 102*   < >  --    < > 89 159* 96  BUN 75*   < > 55*   < > 18   < > 15   < > 18   < >  --    < > 25* 31* 36*  CREATININE 2.90*   < > 2.10*   < > 1.26*   < > 1.26*   < > 1.32*   < >  --    < > 1.20* 1.49* 1.58*  CALCIUM 8.1*   < > 8.4*   < > 8.2*   < > 8.1*   < > 8.7*   < >  --    < > 9.2 9.1 9.1  MG  --   --   --    < > 2.0  --  2.0  --  2.3  --  2.4  --   --   --   --   PHOS 4.9*  --  4.2  --  2.1*  --   --   --   --   --   --   --   --   --   --    < > =  values in this interval not displayed.   Recent Labs    05/10/18 1237 09/11/18 1727 12/25/18 1521  AST 11 14 11*  ALT 10 6 7   ALKPHOS 88 85 65  BILITOT 0.3 0.4 0.6  PROT 8.5 7.9 8.9*  ALBUMIN 3.3* 3.4* 2.2*   Recent Labs    12/30/18 0451 01/01/19 0304 01/02/19 0541  01/07/19 0234 01/08/19 0304 01/09/19 0620  WBC 8.1 13.8* 9.2   < > 9.1 12.4* 9.9  NEUTROABS 5.3 10.8* 6.0  --   --   --   --   HGB 8.2* 7.7* 7.2*   < > 8.6* 8.9* 8.8*  HCT 27.9* 26.3* 25.3*   < > 29.8* 29.7* 29.7*  MCV 88.3 88.9 88.8   < > 92.8 91.7 94.3  PLT 396 391 310   < > 276 286 274   < > = values in this interval not displayed.   Lab Results  Component Value Date   TSH 2.430 09/11/2018   Lab Results  Component Value Date   HGBA1C 5.6 12/17/2016   Lab Results  Component Value Date  CHOL 169 09/11/2018   HDL 40 09/11/2018   LDLCALC 96 09/11/2018   TRIG 167 (H) 09/11/2018   CHOLHDL 4.2 09/11/2018    Assessment/Plan  1. Cellulitis of lower extremity, unspecified laterality -  S/P debridement debridements on 1/8 and 1/10 by Dr. Sharol Given to attempt limb salvage. She is S/P split thickness skin graft 1/15, completed 14-day course of IV antibiotics, follow-up with [facility] in 1 week, continue bilateral Praveena VAC, WBAT to BLE  2. Chronic systolic congestive heart failure (HCC) -Stable, continue Coreg 6.25 mg 1 tab twice a day and Lasix 40 mg 1-1/2 tab= 60 mg daily  3. Anemia in other chronic diseases classified elsewhere -S/P transfusion of 1 unit packed RBC Lab Results  Component Value Date   HGB 8.8 (L) 01/09/2019    4. Chronic kidney disease, stage III (moderate) (HCC) - stable Lab Results  Component Value Date   CREATININE 1.58 (H) 01/09/2019    5. Sarcoidosis ([place]) - stable   6. Essential hypertension - continue Coreg 6.25 mg 1 tab twice a day     Family/ staff Communication: Discussed plan of care with patient.  Labs/tests ordered:  BMP  Goals of care:    Short-term rehabilitation.   Durenda Age, NP [facility] [phone] (Monday-Friday 8:00 a.m. - 5:00 p.m.) [phone] (after hours)

## 2019-01-13 ENCOUNTER — Encounter: Payer: Self-pay | Admitting: Internal Medicine

## 2019-01-13 ENCOUNTER — Non-Acute Institutional Stay (SKILLED_NURSING_FACILITY): Payer: Medicare Other | Admitting: Internal Medicine

## 2019-01-13 DIAGNOSIS — R058 Other specified cough: Secondary | ICD-10-CM

## 2019-01-13 DIAGNOSIS — N183 Chronic kidney disease, stage 3 unspecified: Secondary | ICD-10-CM

## 2019-01-13 DIAGNOSIS — D638 Anemia in other chronic diseases classified elsewhere: Secondary | ICD-10-CM

## 2019-01-13 DIAGNOSIS — L03119 Cellulitis of unspecified part of limb: Secondary | ICD-10-CM

## 2019-01-13 DIAGNOSIS — R05 Cough: Secondary | ICD-10-CM | POA: Diagnosis not present

## 2019-01-13 LAB — BASIC METABOLIC PANEL
BUN: 33 — AB (ref 4–21)
Creatinine: 1.1 (ref 0.5–1.1)
Glucose: 117
Potassium: 3.3 — AB (ref 3.4–5.3)
Sodium: 141 (ref 137–147)

## 2019-01-13 NOTE — Assessment & Plan Note (Signed)
Ortho follow-up 1 week post discharge

## 2019-01-13 NOTE — Assessment & Plan Note (Signed)
Current creatinine 1.58 and BUN 36

## 2019-01-13 NOTE — Patient Instructions (Signed)
See assessment and plan under each diagnosis in the problem list and acutely for this visit 

## 2019-01-13 NOTE — Assessment & Plan Note (Addendum)
Stable normochromic, normocytic anemia with hemoglobin 8.8 and hematocrit 29.7

## 2019-01-13 NOTE — Progress Notes (Signed)
NURSING HOME LOCATION:  [facility] ROOM NUMBER:  108-A  CODE STATUS:  Full Code  PCP:  Rutherford Guys, MD  [address] [place] [address]   This is a comprehensive admission note to [facility] performed on this date less than 30 days from date of admission. Included are preadmission medical/surgical history; reconciled medication list; family history; social history and comprehensive review of systems.  Corrections and additions to the records were documented. Comprehensive physical exam was also performed. Additionally a clinical summary was entered for each active diagnosis pertinent to this admission in the Problem List to enhance continuity of care.  HPI: Patient was hospitalized 1/2-1/17/2020, presenting with several days of worsening drainage and odiferous discharge from leg ulcers of the lower extremities.  Cellulitis bilaterally secondary to chronic venous stasis ulcerations was diagnosed and empiric IV antibiotics initiated and continued for a total of 14 days.  1 of 2 blood cultures revealed micrococcus species felt to be contaminant.  Dr. Sharol Given performed debridements 1/8 and 1/10 to attempt limb salvage.  Skin grafting was completed 1/15. The process was in the context of chronic congestive heart failure with reduced ejection fraction, status post ICD placement for secondary prevention.  She has a history of resuscitation from sudden death. Lower extremity weightbearing was to be as tolerated.  Wound VAC was to be continued for total 2 weeks.  Orthopedic follow-up was to be in 1 week post discharge.  Past medical and surgical history: Also includes essential hypertension, CKD stage III, morbid obesity, anemia of chronic disease, history of sarcoidosis, asthma, depression, and dyslipidemia. Other surgeries include coronary angiography and cataract surgery.  Social history: Nondrinker; never smoked  Family history: Extensive history reviewed   Review of  systems: She volunteers that she has some memory deficit since her cardiac arrest.  She states that she began having green sputum production yesterday.  She admits to some depression.  The remainder of review of systems is negative.  Constitutional: No fever, significant weight change, fatigue  Eyes: No redness, discharge, pain, vision change ENT/mouth: No nasal congestion, purulent discharge, earache, change in hearing, sore throat  Cardiovascular: No chest pain, palpitations, paroxysmal nocturnal dyspnea, claudication, edema  Respiratory: No hemoptysis, DOE, significant snoring, apnea Gastrointestinal: No heartburn, dysphagia, abdominal pain, nausea /vomiting, rectal bleeding, melena, change in bowels Genitourinary: No dysuria, hematuria, pyuria, incontinence, nocturia Musculoskeletal: No joint stiffness, joint swelling, weakness, pain Neurologic: No dizziness, headache, syncope, seizures, numbness, tingling Psychiatric: No  insomnia, anorexia Endocrine: No change in hair/skin/nails, excessive thirst, excessive hunger, excessive urination  Hematologic/lymphatic: No significant bruising, lymphadenopathy, abnormal bleeding Allergy/immunology: No itchy/watery eyes, significant sneezing, urticaria, angioedema  Physical exam:  Pertinent or positive findings: She is morbidly obese.  She is completely edentulous.  There is a small nevus over the lower lip.  Defibrillator is present over the left chest with well healed scar.  Grade 1 systolic murmur is present.  Abdomen is protuberant.  Knees are dramatically enlarged without associated effusion.  Legs are wrapped and pedal pulses are not palpable.  The visible portion of the distal feet reveal thickened, hyperpigmented skin.  General appearance: Adequately nourished; no acute distress, increased work of breathing is present.   Lymphatic: No lymphadenopathy about the head, neck, axilla. Eyes: No conjunctival inflammation or lid edema is present. There  is no scleral icterus. Ears:  External ear exam shows no significant lesions or deformities.   Nose:  External nasal examination shows no deformity or inflammation. Nasal mucosa  are pink and moist without lesions, exudates Oral exam: Lips and gums are healthy appearing.There is no oropharyngeal erythema or exudate. Neck:  No thyromegaly, masses, tenderness noted.    Heart:  Normal rate and regular rhythm. S1 and S2 normal without gallop,click, rub.  Lungs: Chest clear to auscultation without wheezes, rhonchi, rales, rubs. Abdomen: Bowel sounds are normal.  Abdomen is soft and nontender with no organomegaly, hernias, masses. GU: Deferred  Extremities:  No cyanosis, clubbing, edema. Neurologic exam:  Balance, Rhomberg, finger to nose testing could not be completed due to clinical state Skin: Warm & dry w/o tenting.  See clinical summary under each active problem in the Problem List with associated updated therapeutic plan

## 2019-01-15 ENCOUNTER — Telehealth (INDEPENDENT_AMBULATORY_CARE_PROVIDER_SITE_OTHER): Payer: Self-pay

## 2019-01-15 NOTE — Telephone Encounter (Signed)
JoAnn with [facility] would like to know if Wound Vac's can be removed?  Stated that Vac on the left is full and the Vac on the right is almost full.  CB# is [phone].  Please advise.  Thank you.

## 2019-01-16 NOTE — Telephone Encounter (Signed)
Called and sw nurse she states that now both vacs are full with no replacement canisters are unable to order and they are alarming.  Advised to remove the vacs place adaptic over the skin grafts and apply 4x4 kerlix and ace wrap and this to be changed daily and will follow up in the offie on Wednesday.

## 2019-01-21 ENCOUNTER — Ambulatory Visit (INDEPENDENT_AMBULATORY_CARE_PROVIDER_SITE_OTHER): Payer: Medicare Other | Admitting: Orthopedic Surgery

## 2019-01-21 ENCOUNTER — Encounter (INDEPENDENT_AMBULATORY_CARE_PROVIDER_SITE_OTHER): Payer: Self-pay | Admitting: Orthopedic Surgery

## 2019-01-21 DIAGNOSIS — I87333 Chronic venous hypertension (idiopathic) with ulcer and inflammation of bilateral lower extremity: Secondary | ICD-10-CM

## 2019-01-21 DIAGNOSIS — Z945 Skin transplant status: Secondary | ICD-10-CM

## 2019-01-21 DIAGNOSIS — L97919 Non-pressure chronic ulcer of unspecified part of right lower leg with unspecified severity: Secondary | ICD-10-CM

## 2019-01-21 DIAGNOSIS — L97929 Non-pressure chronic ulcer of unspecified part of left lower leg with unspecified severity: Secondary | ICD-10-CM

## 2019-01-21 NOTE — Progress Notes (Signed)
Post-Op Visit Note   Patient: Kari [name]           Date of Birth: 1961-08-24           MRN: [id number] Visit Date: 01/21/2019 PCP: Rutherford Guys, MD  Chief Complaint:  Chief Complaint  Patient presents with  . Right Leg - Wound Check  . Left Leg - Wound Check    HPI:  HPI The patient is a 58 year old woman seen status post bilateral I and d and split thickness skin grafts to bilateral lower extremities. Wound vacs in place.  Ortho Exam Wound vacs removed. On examination of bilateral wounds these are granulating through. Moderate bloody drainage. No surrounding erythema or odor. No sign of infection. Moderate edema to LEs.  Visit Diagnoses:  1. Status post skin graft   2. Idiopathic chronic venous hypertension of both lower extremities with ulcer and inflammation (HCC)     Plan: will apply adaptic and dynaflex wraps bilaterally. Elevate as able to follow up in office Monday for re evaluation and new dressings.   Follow-Up Instructions: Return in about 5 days (around 01/26/2019).   Imaging: No results found.  Orders:  No orders of the defined types were placed in this encounter.  No orders of the defined types were placed in this encounter.    PMFS History: Patient Active Problem List   Diagnosis Date Noted  . CKD (chronic kidney disease), stage III ([place]) 01/13/2019  . Infected wound 12/26/2018  . Idiopathic chronic venous hypertension of both lower extremities with ulcer and inflammation ([place])   . History of fall 05/10/2018  . Hyperkalemia 02/21/2018  . Hypotension 02/21/2018  . Sepsis ([place]) 02/21/2018  . Encounter for central line placement   . Mitral regurgitation 10/30/2017  . Acute kidney injury ([place]) 07/21/2017  . Generalized weakness 07/20/2017  . Shock circulatory ([place])   . Acute respiratory failure with hypoxia ([place])   . Pulmonary edema   . Cardiac arrest ([place]) 06/26/2017  . Ventricular fibrillation ([place])   . Acute on chronic diastolic heart  failure ([place])   . Abnormal ECG   . Cellulitis 04/02/2017  . AKI (acute kidney injury) ([place]) 12/17/2016  . Cellulitis and abscess of right lower extremity 12/17/2016  . Syncope and collapse 12/17/2016  . Peripheral edema 12/17/2016  . Hypokalemia 12/17/2016  . Anemia of chronic disease 12/17/2016  . Acute diastolic CHF (congestive heart failure) ([place]) 03/14/2014  . CHF (congestive heart failure) ([place]) 03/12/2014  . Sarcoidosis ([place]) 03/12/2014  . Severe sepsis ([place]) 10/17/2013  . ARF (acute renal failure) ([place]) 10/08/2013  . Cellulitis of multiple sites of lower extremity 10/08/2013  . Morbid obesity ([place]) 10/08/2013  . Volume depletion 10/08/2013  . Venous (peripheral) insufficiency 10/28/2012  . Mediastinal lymphadenopathy 10/16/2012  . Pulmonary nodules 10/16/2012  . 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 Chest pain 09/02/2012  . Asthma   . Hypertension   . Depression   . Venous stasis ulcers ([place]) 07/29/2012  . Varicose veins of lower extremities with ulcer ([place]) 07/08/2012  . Venous ulcer of leg ([place]) 07/08/2012   Past Medical History:  Diagnosis Date  . Asthma   . CHF (congestive heart failure) ([place]) 03/12/2014   a. EF 40-45% by echo in 06/2017 with normal cors by cath. ICD placed following VT arrest  . Depression   . Headache(784.0)   . Hyperlipidemia   . Hypertension   . Lung nodules   .  Renal disorder   . Sarcoidosis   . Ulcer    recurring, from chronic venous insufficiency    Family History  Problem Relation Age of Onset  . Heart failure Mother   . Diabetes Mellitus II Mother   . Hypertension Mother   . Cancer Mother        unknown type  . Heart disease Father   . Stroke Father   . Diabetes Mellitus II Father     Past Surgical History:  Procedure Laterality Date  . cataract surgery Left 07-2013  . I&D EXTREMITY Bilateral 12/31/2018   Procedure: DEBRIDEMENT BILATERAL LEGS, APPLY VAC X 2;  Surgeon:  [name], MD;  Location: [facility];  Service: Orthopedics;  Laterality: Bilateral;  . I&D EXTREMITY Bilateral 01/02/2019   Procedure: REPEAT DEBRIDEMENT BILATERAL LEGS, APPLY VAC X 2;  Surgeon: [name], MD;  Location: [facility];  Service: Orthopedics;  Laterality: Bilateral;  . ICD IMPLANT N/A 07/05/2017   Procedure: ICD Implant;  Surgeon: Constance Haw, MD;  Location: [facility];  Service: Cardiovascular;  Laterality: N/A;  . LEFT HEART CATH AND CORONARY ANGIOGRAPHY N/A 07/03/2017   Procedure: Left Heart Cath and Coronary Angiography;  Surgeon: Belva Crome, MD;  Location: [facility];  Service: Cardiovascular;  Laterality: N/A;  . SKIN SPLIT GRAFT Bilateral 01/07/2019   Procedure: SPLIT THICKNESS SKIN GRAFT BILATERAL LEGS, APPLY VAC;  Surgeon: [name], MD;  Location: [facility];  Service: Orthopedics;  Laterality: Bilateral;   Social History   Occupational History  . Not on file  Tobacco Use  . Smoking status: Never Smoker  . Smokeless tobacco: Never Used  Substance and Sexual Activity  . Alcohol use: No  . Drug use: No  . Sexual activity: Not Currently

## 2019-01-23 ENCOUNTER — Encounter: Payer: Self-pay | Admitting: Adult Health

## 2019-01-23 ENCOUNTER — Non-Acute Institutional Stay (SKILLED_NURSING_FACILITY): Payer: Medicare Other | Admitting: Adult Health

## 2019-01-23 DIAGNOSIS — L03119 Cellulitis of unspecified part of limb: Secondary | ICD-10-CM | POA: Diagnosis not present

## 2019-01-23 DIAGNOSIS — I10 Essential (primary) hypertension: Secondary | ICD-10-CM | POA: Diagnosis not present

## 2019-01-23 DIAGNOSIS — M1A9XX0 Chronic gout, unspecified, without tophus (tophi): Secondary | ICD-10-CM

## 2019-01-23 DIAGNOSIS — I5022 Chronic systolic (congestive) heart failure: Secondary | ICD-10-CM

## 2019-01-23 DIAGNOSIS — Z20828 Contact with and (suspected) exposure to other viral communicable diseases: Secondary | ICD-10-CM

## 2019-01-23 NOTE — Progress Notes (Signed)
Location:  [facility] Room Number: 108-A Place of Service:  SNF (31) Provider:  Durenda Age, NP  Patient Care Team: Rutherford Guys, MD as PCP - General (Family Medicine) Lendon Colonel, NP as Nurse Practitioner (Nurse Practitioner) Rosita Fire, MD as Consulting Physician (Nephrology)  Extended Emergency Contact Information Primary Emergency Contact: Harlan, [place] Home Phone: [phone] Relation: Sister  Code Status:  Full Code  Goals of care: Advanced Directive information Advanced Directives 01/07/2019  Does Patient Have a Medical Advance Directive? No  Type of Advance Directive -  Does patient want to make changes to medical advance directive? -  Would patient like information on creating a medical advance directive? No - Patient declined  Pre-existing out of facility DNR order (yellow form or pink MOST form) -     Chief Complaint  Patient presents with  . Medical Management of Chronic Issues    Routine rehabilitation visit at [facility]    HPI:  Kari [name] is a 58 y.o. female seen today for a routine rehabilitation visit in [facility].  She is a short-term rehabilitation resident.  She has a PMH of CHF with reduced EF, HTN, stage III CKD, and morbid obesity. She was seen in the room today. Wound vac was recently removed on bilateral lower legs and now covered with unna boots.   She has been admitted to [facility] on 01/09/19 post hospitalization at [facility] 1/2 to 01/09/19 for purulent cellulitis of BLE. She had debridements done one 1/8 and 1/10 to attempt limb salvage. She is S/P split thickness skin graft 1/15. She completed 14-day course of antibiotics IV. She had blood transfusion of 1 unit PRBC on 1/13 for hgb 7.2.   Past Medical History:  Diagnosis Date  . Asthma   . CHF (congestive heart failure) ([place]) 03/12/2014   a. EF 40-45% by echo in 06/2017 with normal cors by cath. ICD placed following VT  arrest  . Depression   . Headache(784.0)   . Hyperlipidemia   . Hypertension   . Lung nodules   . Renal disorder   . Sarcoidosis   . Ulcer    recurring, from chronic venous insufficiency   Past Surgical History:  Procedure Laterality Date  . cataract surgery Left 07-2013  . I&D EXTREMITY Bilateral 12/31/2018   Procedure: DEBRIDEMENT BILATERAL LEGS, APPLY VAC X 2;  Surgeon: [name], MD;  Location: [facility];  Service: Orthopedics;  Laterality: Bilateral;  . I&D EXTREMITY Bilateral 01/02/2019   Procedure: REPEAT DEBRIDEMENT BILATERAL LEGS, APPLY VAC X 2;  Surgeon: [name], MD;  Location: [facility];  Service: Orthopedics;  Laterality: Bilateral;  . ICD IMPLANT N/A 07/05/2017   Procedure: ICD Implant;  Surgeon: Constance Haw, MD;  Location: [facility];  Service: Cardiovascular;  Laterality: N/A;  . LEFT HEART CATH AND CORONARY ANGIOGRAPHY N/A 07/03/2017   Procedure: Left Heart Cath and Coronary Angiography;  Surgeon: Belva Crome, MD;  Location: [facility];  Service: Cardiovascular;  Laterality: N/A;  . SKIN SPLIT GRAFT Bilateral 01/07/2019   Procedure: SPLIT THICKNESS SKIN GRAFT BILATERAL LEGS, APPLY VAC;  Surgeon: [name], MD;  Location: [facility];  Service: Orthopedics;  Laterality: Bilateral;    Allergies  Allergen Reactions  . Tramadol Other (See Comments)    hallucination    Outpatient Encounter Medications as of 01/23/2019  Medication Sig  . acetaminophen (TYLENOL) 500 MG tablet Take 1,000 mg by mouth every 6 (  six) hours as needed for mild pain.  Marland Kitchen albuterol (PROVENTIL HFA;VENTOLIN HFA) 108 (90 Base) MCG/ACT inhaler Inhale 2 puffs into the lungs every 6 (six) hours as needed for shortness of breath.  Marland Kitchen aspirin EC 81 MG tablet Take 81 mg by mouth daily.  . carvedilol (COREG) 6.25 MG tablet Take 6.25 mg by mouth 2 (two) times daily.   . Cholecalciferol (VITAMIN D) 50 MCG (2000 UT) CAPS Take 2,000 Units by mouth daily.  Marland Kitchen docusate sodium (COLACE) 100  MG capsule Take 100 mg by mouth daily.  . febuxostat (ULORIC) 40 MG tablet Take 40 mg by mouth daily.  . furosemide (LASIX) 40 MG tablet Take 60 mg by mouth daily. Take 1-1/2 tablets to = 60 mg  . guaiFENesin (ROBITUSSIN) 100 MG/5ML SOLN Take 10 mLs by mouth every 6 (six) hours as needed for cough or to loosen phlegm.  . loratadine (CLARITIN) 10 MG tablet Take 10 mg by mouth daily as needed for allergies.   Marland Kitchen oseltamivir (TAMIFLU) 75 MG capsule Take 75 mg by mouth daily.  . promethazine (PHENERGAN) 25 MG tablet Take 25 mg by mouth every 6 (six) hours as needed for nausea or vomiting. x3 doses, notify provider if symptoms persist   No facility-administered encounter medications on file as of 01/23/2019.     Review of Systems  GENERAL: No change in appetite, no fatigue, no weight changes, no fever, chills or weakness MOUTH and THROAT: Denies oral discomfort, gingival pain or bleeding RESPIRATORY: no cough, SOB, DOE, wheezing, hemoptysis CARDIAC: No chest pain, edema or palpitations GI: No abdominal pain, diarrhea, constipation, heart burn, nausea or vomiting GU: Denies dysuria, frequency, hematuria, incontinence, or discharge NEUROLOGICAL: Denies dizziness, syncope, numbness, or headache PSYCHIATRIC: Denies feelings of depression or anxiety. No report of hallucinations, insomnia, paranoia, or agitation   Immunization History  Administered Date(s) Administered  . Influenza Split 09/04/2012  . Influenza,inj,Quad PF,6+ Mos 09/11/2018  . Influenza-Unspecified 10/24/2016  . Pneumococcal Polysaccharide-23 06/22/2012, 07/21/2017   Pertinent  Health Maintenance Due  Topic Date Due  . PAP SMEAR-Modifier  02/09/1983  . MAMMOGRAM  02/10/2012  . COLONOSCOPY  02/10/2012  . INFLUENZA VACCINE  Completed   Fall Risk  09/11/2018 05/10/2018 03/24/2018 03/04/2018 02/06/2018  Falls in the past year? No Yes Yes No No  Number falls in past yr: - 2 or more 1 - -  Comment - - patient stepped into hole outside  apartment - -  Injury with Fall? - No Yes - -  Comment - - soft tissue injury resolved without intervention - -  Risk for fall due to : - - History of fall(s);Impaired mobility - -  Risk for fall due to: Comment - - - - -  Follow up - - Falls prevention discussed - -      Vitals:   01/23/19 0853  BP: 128/74  Pulse: 74  Resp: 20  Temp: 98.7 F (37.1 C)  TempSrc: Oral  SpO2: 95%  Weight: 255 lb 9.6 oz (115.9 kg)  Height: 5\' 7"  (1.702 m)   Body mass index is 40.03 kg/m.  Physical Exam  GENERAL APPEARANCE: Well nourished. In no acute distress. Morbidly obese. SKIN:  Bilateral lower extremities covered with unna boots MOUTH and THROAT: Lips are without lesions. Oral mucosa is moist and without lesions.  RESPIRATORY: Breathing is even & unlabored, BS CTAB CARDIAC: RRR, no murmur,no extra heart sounds, left chest defibrillator GI: Abdomen soft, normal BS, no masses, no tenderness NEUROLOGICAL: There is no  tremor. Speech is clear. Alert and oriented X 3 PSYCHIATRIC:  Affect and behavior are appropriate   Labs reviewed: Recent Labs    02/21/18 1540  02/22/18 0521  02/24/18 0500  12/27/18 0243  01/02/19 0541  01/05/19 1445  01/07/19 0234 01/08/19 0304 01/09/19 0620 01/13/19  NA 138   < > 145   < > 142   < > 137   < > 138   < >  --    < > 140 139 141 141  K 5.3*   < > 4.3   < > 3.9   < > 2.9*   < > 3.0*   < >  --    < > 4.2 4.0 3.4* 3.3*  CL 115*   < > 116*   < > 114*   < > 103   < > 99   < >  --    < > 106 107 108  --   CO2 17*   < > 22   < > 25   < > 27   < > 31   < >  --    < > 25 24 25   --   GLUCOSE 107*   < > 122*   < > 102*   < > 113*   < > 102*   < >  --    < > 89 159* 96  --   BUN 75*   < > 55*   < > 18   < > 15   < > 18   < >  --    < > 25* 31* 36* 33*  CREATININE 2.90*   < > 2.10*   < > 1.26*   < > 1.26*   < > 1.32*   < >  --    < > 1.20* 1.49* 1.58* 1.1  CALCIUM 8.1*   < > 8.4*   < > 8.2*   < > 8.1*   < > 8.7*   < >  --    < > 9.2 9.1 9.1  --   MG  --   --   --     < > 2.0  --  2.0  --  2.3  --  2.4  --   --   --   --   --   PHOS 4.9*  --  4.2  --  2.1*  --   --   --   --   --   --   --   --   --   --   --    < > = values in this interval not displayed.   Recent Labs    05/10/18 1237 09/11/18 1727 12/25/18 1521  AST 11 14 11*  ALT 10 6 7   ALKPHOS 88 85 65  BILITOT 0.3 0.4 0.6  PROT 8.5 7.9 8.9*  ALBUMIN 3.3* 3.4* 2.2*   Recent Labs    12/30/18 0451 01/01/19 0304 01/02/19 0541  01/07/19 0234 01/08/19 0304 01/09/19 0620  WBC 8.1 13.8* 9.2   < > 9.1 12.4* 9.9  NEUTROABS 5.3 10.8* 6.0  --   --   --   --   HGB 8.2* 7.7* 7.2*   < > 8.6* 8.9* 8.8*  HCT 27.9* 26.3* 25.3*   < > 29.8* 29.7* 29.7*  MCV 88.3 88.9 88.8   < > 92.8 91.7 94.3  PLT 396 391 310   < > 276 286  274   < > = values in this interval not displayed.   Lab Results  Component Value Date   TSH 2.430 09/11/2018   Lab Results  Component Value Date   HGBA1C 5.6 12/17/2016   Lab Results  Component Value Date   CHOL 169 09/11/2018   HDL 40 09/11/2018   LDLCALC 96 09/11/2018   TRIG 167 (H) 09/11/2018   CHOLHDL 4.2 09/11/2018    Assessment/Plan  1. Cellulitis of lower extremity, unspecified laterality - wound vac has been removed, has bilateral unna boots, und vac has been removed  2. Chronic systolic congestive heart failure (HCC) - continue Lasix 40 mg 1 1/2 = 60 mg daily  3. Essential hypertension -well-controlled, continue Coreg 6.25 mg BID,  4. Chronic gout without tophus, unspecified cause, unspecified site - continue Uloric 40 mg 1 tab daily  5. Exposure to the flu - continue Tamiflu   Family/ staff Communication: Discussed plan of care with resident.  Labs/tests ordered:  None  Goals of care:   Short-term rehabilitation.   Durenda Age, NP [facility] [phone] (Monday-Friday 8:00 a.m. - 5:00 p.m.) [phone] (after hours)

## 2019-01-26 ENCOUNTER — Ambulatory Visit (INDEPENDENT_AMBULATORY_CARE_PROVIDER_SITE_OTHER): Payer: Medicare Other | Admitting: Physician Assistant

## 2019-01-27 ENCOUNTER — Ambulatory Visit (INDEPENDENT_AMBULATORY_CARE_PROVIDER_SITE_OTHER): Payer: Medicare Other | Admitting: Physician Assistant

## 2019-02-02 ENCOUNTER — Non-Acute Institutional Stay (SKILLED_NURSING_FACILITY): Payer: Medicare Other | Admitting: Adult Health

## 2019-02-02 ENCOUNTER — Encounter (INDEPENDENT_AMBULATORY_CARE_PROVIDER_SITE_OTHER): Payer: Self-pay | Admitting: Physician Assistant

## 2019-02-02 ENCOUNTER — Ambulatory Visit (INDEPENDENT_AMBULATORY_CARE_PROVIDER_SITE_OTHER): Payer: Medicare Other | Admitting: Physician Assistant

## 2019-02-02 ENCOUNTER — Encounter: Payer: Self-pay | Admitting: Adult Health

## 2019-02-02 VITALS — Ht 67.0 in | Wt 248.0 lb

## 2019-02-02 DIAGNOSIS — D508 Other iron deficiency anemias: Secondary | ICD-10-CM

## 2019-02-02 DIAGNOSIS — L97919 Non-pressure chronic ulcer of unspecified part of right lower leg with unspecified severity: Secondary | ICD-10-CM

## 2019-02-02 DIAGNOSIS — M1A0790 Idiopathic chronic gout, unspecified ankle and foot, without tophus (tophi): Secondary | ICD-10-CM

## 2019-02-02 DIAGNOSIS — I10 Essential (primary) hypertension: Secondary | ICD-10-CM | POA: Diagnosis not present

## 2019-02-02 DIAGNOSIS — N183 Chronic kidney disease, stage 3 unspecified: Secondary | ICD-10-CM

## 2019-02-02 DIAGNOSIS — I5022 Chronic systolic (congestive) heart failure: Secondary | ICD-10-CM | POA: Diagnosis not present

## 2019-02-02 DIAGNOSIS — L03119 Cellulitis of unspecified part of limb: Secondary | ICD-10-CM | POA: Diagnosis not present

## 2019-02-02 DIAGNOSIS — L97929 Non-pressure chronic ulcer of unspecified part of left lower leg with unspecified severity: Secondary | ICD-10-CM

## 2019-02-02 DIAGNOSIS — Z945 Skin transplant status: Secondary | ICD-10-CM

## 2019-02-02 DIAGNOSIS — I87333 Chronic venous hypertension (idiopathic) with ulcer and inflammation of bilateral lower extremity: Secondary | ICD-10-CM

## 2019-02-02 MED ORDER — CARVEDILOL 6.25 MG PO TABS: 6.2500 mg | ORAL_TABLET | Freq: Two times a day (BID) | ORAL | 0 refills | Status: AC

## 2019-02-02 MED ORDER — ALBUTEROL SULFATE HFA 108 (90 BASE) MCG/ACT IN AERS: 2.0000 | INHALATION_SPRAY | Freq: Four times a day (QID) | RESPIRATORY_TRACT | 0 refills | Status: AC | PRN

## 2019-02-02 MED ORDER — LORATADINE 10 MG PO TABS: 10.0000 mg | ORAL_TABLET | Freq: Every day | ORAL | 0 refills | Status: DC | PRN

## 2019-02-02 MED ORDER — HYDROCODONE-ACETAMINOPHEN 5-325 MG PO TABS: 1.0000 | ORAL_TABLET | Freq: Four times a day (QID) | ORAL | 0 refills | Status: DC | PRN

## 2019-02-02 MED ORDER — FUROSEMIDE 40 MG PO TABS: 60.0000 mg | ORAL_TABLET | Freq: Every day | ORAL | 0 refills | Status: DC

## 2019-02-02 MED ORDER — FEBUXOSTAT 40 MG PO TABS: 40.0000 mg | ORAL_TABLET | Freq: Every day | ORAL | 0 refills | Status: DC

## 2019-02-02 NOTE — Progress Notes (Signed)
Location:  [facility] Room Number: 108-A Place of Service:  SNF (31) Provider:  Durenda Age, NP  Patient Care Team: Rutherford Guys, MD as PCP - General (Family Medicine) Lendon Colonel, NP as Nurse Practitioner (Nurse Practitioner) Rosita Fire, MD as Consulting Physician (Nephrology)  Extended Emergency Contact Information Primary Emergency Contact: Genny, Caulder Home Phone: [phone] Relation: Sister  Code Status:  Full Code  Goals of care: Advanced Directive information Advanced Directives 01/07/2019  Does Patient Have a Medical Advance Directive? No  Type of Advance Directive -  Does patient want to make changes to medical advance directive? -  Would patient like information on creating a medical advance directive? No - Patient declined  Pre-existing out of facility DNR order (yellow form or pink MOST form) -     Chief Complaint  Patient presents with  . Discharge Note    Patient to discharge home 02/03/19    HPI:  Pt is a 58 y.o. female seen today for discharge.  She is to discharge home on 02/03/19 with home health PT, OT and Nursing.   She has been admitted to [facility] on 01/09/19 from a recent hospitalization for purulent cellulitis of bilateral lower extremities.  She had debridement on 12/31/2018 and 01/02/2019 by Dr. Sharol Given to attempt limb salvage.  She is S/P split thickness skin graft on 01/07/2019.  She completed 14-day course of IV antibiotics.  She had blood transfusion of 1 unit packed RBC on 1/13, hemoglobin down to 7.2. She has a PMH of CHF with reduced ejection fraction, hypertension, stage III CKD, and morbid obesity.     Latest hgb is 8.8, will start patient on Ferrous sulfate. Latest creatinine is 1.12 with GFR 63.39, CKD stage 2. Patient has a follow-up appointment with Dr. Sharol Given today. She will follow-up with her PCP upon discharge.  Patient was admitted to this facility for  short-term rehabilitation after the patient's recent hospitalization.  Patient has completed SNF rehabilitation and therapy has cleared the patient for discharge.   Past Medical History:  Diagnosis Date  . Asthma   . CHF (congestive heart failure) ([place]) 03/12/2014   a. EF 40-45% by echo in 06/2017 with normal cors by cath. ICD placed following VT arrest  . Depression   . Headache(784.0)   . Hyperlipidemia   . Hypertension   . Lung nodules   . Renal disorder   . Sarcoidosis   . Ulcer    recurring, from chronic venous insufficiency   Past Surgical History:  Procedure Laterality Date  . cataract surgery Left 07-2013  . I&D EXTREMITY Bilateral 12/31/2018   Procedure: DEBRIDEMENT BILATERAL LEGS, APPLY VAC X 2;  Surgeon: [name], MD;  Location: [facility];  Service: Orthopedics;  Laterality: Bilateral;  . I&D EXTREMITY Bilateral 01/02/2019   Procedure: REPEAT DEBRIDEMENT BILATERAL LEGS, APPLY VAC X 2;  Surgeon: [name], MD;  Location: [facility];  Service: Orthopedics;  Laterality: Bilateral;  . ICD IMPLANT N/A 07/05/2017   Procedure: ICD Implant;  Surgeon: Constance Haw, MD;  Location: [facility];  Service: Cardiovascular;  Laterality: N/A;  . LEFT HEART CATH AND CORONARY ANGIOGRAPHY N/A 07/03/2017   Procedure: Left Heart Cath and Coronary Angiography;  Surgeon: Belva Crome, MD;  Location: [facility];  Service: Cardiovascular;  Laterality: N/A;  . SKIN SPLIT GRAFT Bilateral 01/07/2019   Procedure: SPLIT THICKNESS SKIN GRAFT BILATERAL LEGS, APPLY VAC;  Surgeon: Meridee Score  V, MD;  Location: [facility];  Service: Orthopedics;  Laterality: Bilateral;    Allergies  Allergen Reactions  . Tramadol Other (See Comments)    hallucination    Outpatient Encounter Medications as of 02/02/2019  Medication Sig  . acetaminophen (TYLENOL) 500 MG tablet Take 1,000 mg by mouth every 6 (six) hours as needed for mild pain.  Marland Kitchen albuterol (PROVENTIL HFA;VENTOLIN HFA) 108 (90 Base)  MCG/ACT inhaler Inhale 2 puffs into the lungs every 6 (six) hours as needed for shortness of breath.  Marland Kitchen aspirin EC 81 MG tablet Take 81 mg by mouth daily.  . carvedilol (COREG) 6.25 MG tablet Take 6.25 mg by mouth 2 (two) times daily.   . Cholecalciferol (VITAMIN D) 50 MCG (2000 UT) CAPS Take 2,000 Units by mouth daily.  Marland Kitchen docusate sodium (COLACE) 100 MG capsule Take 100 mg by mouth daily.  . febuxostat (ULORIC) 40 MG tablet Take 40 mg by mouth daily.  . furosemide (LASIX) 40 MG tablet Take 60 mg by mouth daily. Take 1-1/2 tablets to = 60 mg  . guaiFENesin (ROBITUSSIN) 100 MG/5ML SOLN Take 10 mLs by mouth every 6 (six) hours as needed for cough or to loosen phlegm.  . loratadine (CLARITIN) 10 MG tablet Take 10 mg by mouth daily as needed for allergies.   . promethazine (PHENERGAN) 25 MG tablet Take 25 mg by mouth every 6 (six) hours as needed for nausea or vomiting. x3 doses, notify provider if symptoms persist   No facility-administered encounter medications on file as of 02/02/2019.     Review of Systems  GENERAL: No change in appetite, no fatigue, no weight changes, no fever, chills or weakness MOUTH and THROAT: Denies oral discomfort, gingival pain or bleeding, pain from teeth or hoarseness   RESPIRATORY: no cough, SOB, DOE, wheezing, hemoptysis CARDIAC: No chest pain, or palpitations GI: No abdominal pain, diarrhea, constipation, heart burn, nausea or vomiting GU: Denies dysuria, frequency, hematuria, incontinence, or discharge NEUROLOGICAL: Denies dizziness, syncope, numbness, or headache PSYCHIATRIC: Denies feelings of depression or anxiety. No report of hallucinations, insomnia, paranoia, or agitation   Immunization History  Administered Date(s) Administered  . Influenza Split 09/04/2012  . Influenza,inj,Quad PF,6+ Mos 09/11/2018  . Influenza-Unspecified 10/24/2016  . Pneumococcal Polysaccharide-23 06/22/2012, 07/21/2017   Pertinent  Health Maintenance Due  Topic Date Due  .  PAP SMEAR-Modifier  02/09/1983  . MAMMOGRAM  02/10/2012  . COLONOSCOPY  02/10/2012  . INFLUENZA VACCINE  Completed   Fall Risk  09/11/2018 05/10/2018 03/24/2018 03/04/2018 02/06/2018  Falls in the past year? No Yes Yes No No  Number falls in past yr: - 2 or more 1 - -  Comment - - patient stepped into hole outside apartment - -  Injury with Fall? - No Yes - -  Comment - - soft tissue injury resolved without intervention - -  Risk for fall due to : - - History of fall(s);Impaired mobility - -  Risk for fall due to: Comment - - - - -  Follow up - - Falls prevention discussed - -     Vitals:   02/02/19 0916  BP: 108/66  Pulse: 75  Resp: 18  Temp: (!) 97 F (36.1 C)  TempSrc: Oral  SpO2: 95%  Weight: 248 lb 3.2 oz (112.6 kg)  Height: 5\' 7"  (1.702 m)   Body mass index is 38.87 kg/m.  Physical Exam  GENERAL APPEARANCE: Well nourished. In no acute distress. Obese SKIN:  Bilateral lower legs covered with unna  boots, dry MOUTH and THROAT: Lips are without lesions. Oral mucosa is moist and without lesions. Tongue is normal in shape, size, and color and without lesions RESPIRATORY: Breathing is even & unlabored, BS CTAB CARDIAC: RRR, no murmur,no extra heart sounds, BLE 2+ edema, left chest ICD GI: Abdomen soft, normal BS, no masses, no tenderness EXTREMITIES:  Able to move X 4 extremities NEUROLOGICAL: There is no tremor. Speech is clear. Alert and oriented X 3. PSYCHIATRIC: . Affect and behavior are appropriate   Labs reviewed: Recent Labs    02/21/18 1540  02/22/18 0521  02/24/18 0500  12/27/18 0243  01/02/19 0541  01/05/19 1445  01/07/19 0234 01/08/19 0304 01/09/19 0620 01/13/19  NA 138   < > 145   < > 142   < > 137   < > 138   < >  --    < > 140 139 141 141  K 5.3*   < > 4.3   < > 3.9   < > 2.9*   < > 3.0*   < >  --    < > 4.2 4.0 3.4* 3.3*  CL 115*   < > 116*   < > 114*   < > 103   < > 99   < >  --    < > 106 107 108  --   CO2 17*   < > 22   < > 25   < > 27   < > 31    < >  --    < > 25 24 25   --   GLUCOSE 107*   < > 122*   < > 102*   < > 113*   < > 102*   < >  --    < > 89 159* 96  --   BUN 75*   < > 55*   < > 18   < > 15   < > 18   < >  --    < > 25* 31* 36* 33*  CREATININE 2.90*   < > 2.10*   < > 1.26*   < > 1.26*   < > 1.32*   < >  --    < > 1.20* 1.49* 1.58* 1.1  CALCIUM 8.1*   < > 8.4*   < > 8.2*   < > 8.1*   < > 8.7*   < >  --    < > 9.2 9.1 9.1  --   MG  --   --   --    < > 2.0  --  2.0  --  2.3  --  2.4  --   --   --   --   --   PHOS 4.9*  --  4.2  --  2.1*  --   --   --   --   --   --   --   --   --   --   --    < > = values in this interval not displayed.   Recent Labs    05/10/18 1237 09/11/18 1727 12/25/18 1521  AST 11 14 11*  ALT 10 6 7   ALKPHOS 88 85 65  BILITOT 0.3 0.4 0.6  PROT 8.5 7.9 8.9*  ALBUMIN 3.3* 3.4* 2.2*   Recent Labs    12/30/18 0451 01/01/19 0304 01/02/19 0541  01/07/19 0234 01/08/19 0304 01/09/19 0620  WBC 8.1 13.8* 9.2   < >  9.1 12.4* 9.9  NEUTROABS 5.3 10.8* 6.0  --   --   --   --   HGB 8.2* 7.7* 7.2*   < > 8.6* 8.9* 8.8*  HCT 27.9* 26.3* 25.3*   < > 29.8* 29.7* 29.7*  MCV 88.3 88.9 88.8   < > 92.8 91.7 94.3  PLT 396 391 310   < > 276 286 274   < > = values in this interval not displayed.   Lab Results  Component Value Date   TSH 2.430 09/11/2018   Lab Results  Component Value Date   HGBA1C 5.6 12/17/2016   Lab Results  Component Value Date   CHOL 169 09/11/2018   HDL 40 09/11/2018   LDLCALC 96 09/11/2018   TRIG 167 (H) 09/11/2018   CHOLHDL 4.2 09/11/2018    Assessment/Plan  1. Cellulitis of multiple sites of lower extremity - had debridement on 12/31/2018 and 01/02/2019 by Dr. Sharol Given to attempt limb salvage.  She is S/P split thickness skin graft on 01/07/2019.  She completed 14-day course of IV antibiotics. - Wound vacs has been discontinued, currently has bilateral unna boots on bilateral lower extremities, will follow up with Dr. Sharol Given today, 02/02/19, will discharge with Home health PT, OT and  Nursing   2. Other iron deficiency anemia - ferrous sulfate 325 (65 FE) MG tablet; Take 325 mg by mouth daily with breakfast.  3. Chronic systolic CHF (congestive heart failure) (HCC) - albuterol (PROVENTIL HFA;VENTOLIN HFA) 108 (90 Base) MCG/ACT inhaler; Inhale 2 puffs into the lungs every 6 (six) hours as needed for shortness of breath.  Dispense: 18 g; Refill: 0 - carvedilol (COREG) 6.25 MG tablet; Take 1 tablet (6.25 mg total) by mouth 2 (two) times daily.  Dispense: 60 tablet; Refill: 0 - furosemide (LASIX) 40 MG tablet; Take 1.5 tablets (60 mg total) by mouth daily. Take 1-1/2 tablets to = 60 mg  Dispense: 45 tablet; Refill: 0  4. Essential hypertension - carvedilol (COREG) 6.25 MG tablet; Take 1 tablet (6.25 mg total) by mouth 2 (two) times daily.  Dispense: 60 tablet; Refill: 0  5. Idiopathic chronic gout of foot without tophus, unspecified laterality - febuxostat (ULORIC) 40 MG tablet; Take 1 tablet (40 mg total) by mouth daily.  Dispense: 30 tablet; Refill: 0     I have filled out patient's discharge paperwork and written prescriptions.  Patient will receive home health PT, OT and Nursing.   DME provided: 3 in 1 bedside commode, shower bench, Rollator Greater than 50% was spent in counseling and coordination of care.  Total discharge time: Greater than 30 minutes  Discharge time involved coordination of the discharge process with social worker, nursing staff and therapy department. Medical justification for home health services/DME verified.   Durenda Age, NP [facility] [phone] (Monday-Friday 8:00 a.m. - 5:00 p.m.) [phone] (after hours)

## 2019-02-02 NOTE — Addendum Note (Signed)
Addended by: Milas Gain on: 02/02/2019 01:52 PM   Modules accepted: Orders

## 2019-02-02 NOTE — Progress Notes (Signed)
Office Visit Note   Patient: Kari [name]           Date of Birth: 03-10-1961           MRN: [id number] Visit Date: 02/02/2019              Requested by: Rutherford Guys, MD [address] [place], [place] [address] PCP: Rutherford Guys, MD  Chief Complaint  Patient presents with  . Left Leg - Routine Post Op    01/07/2019 BLE I&D STSG  . Right Leg - Routine Post Op      HPI: The patient is a 58 year old woman who is seen for postoperative follow-up following split thickness skin grafting with allograft to bilateral lower extremity ulcers on 01/07/2019.  She is currently at [facility] facility but is being discharged soon.  She reports pain over the ulcerated areas.  She has been in a Dynaflex compression wrap for the past week.  Assessment & Plan: Visit Diagnoses:  1. Status post skin graft   2. 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 CKD (chronic kidney disease), stage III (HCC)     Plan: A portion of the staples were removed today.  She did not tolerate this very well and has no pain medication and I did order her some hydrocodone for pain management and she is going to take this prior to her staple remover next week.  We will cover the grafted site with Adaptic and then reapply multilayer Dynaflex compression wraps for edema control.  Have recommended she have home health PT for progression with gait weightbearing as tolerated, and strengthening.  Home health occupational therapy to address ADLs.  And home health RN to monitor tolerance to layered compression wraps.  Have also recommended to the patient that she get back in bed and elevate her legs several times during the daytime higher than the level of her heart for edema control.  She will follow-up here in 1 week or sooner should she have difficulties in the interim.  Follow-Up Instructions: Return in about 1 week (around 02/09/2019).   Ortho  Exam  Patient is alert, oriented, no adenopathy, well-dressed, normal affect, normal respiratory effort. Bilateral lower leg graft sites are incorporating well.  There is scant serous appearing drainage.  There is no periwound breakdown or evidence of cellulitis or infection.  Her edema is well controlled with the compression wraps.  Some of the staples removed but she had poor tolerance to this. .     Imaging: No results found. No images are attached to the encounter.  Labs: Lab Results  Component Value Date   HGBA1C 5.6 12/17/2016   HGBA1C 5.5 03/12/2014   HGBA1C 6.0 (H) 06/21/2012   REPTSTATUS 12/30/2018 FINAL 12/25/2018   GRAMSTAIN  06/27/2017    ABUNDANT WBC PRESENT,BOTH PMN AND MONONUCLEAR RARE GRAM NEGATIVE RODS RARE GRAM NEGATIVE COCCI IN PAIRS    CULT  12/25/2018    NO GROWTH 5 DAYS Performed at [facility], [place] [address] [place], [place] [address]      Lab Results  Component Value Date   ALBUMIN 2.2 (L) 12/25/2018   ALBUMIN 3.4 (L) 09/11/2018   ALBUMIN 3.3 (L) 05/10/2018    Body mass index is 38.84 kg/m.  Orders:  No orders of the defined types were placed in this encounter.  No orders of the defined types were placed in this encounter.  Procedures: No procedures performed  Clinical Data: No additional findings.  ROS:  All other systems negative, except as noted in the HPI. Review of Systems  Objective: Vital Signs: Ht 5\' 7"  (1.702 m)   Wt 248 lb (112.5 kg)   LMP 11/21/2011   BMI 38.84 kg/m   Specialty Comments:  No specialty comments available.  PMFS History: Patient Active Problem List   Diagnosis Date Noted  . CKD (chronic kidney disease), stage III ([place]) 01/13/2019  . Infected wound 12/26/2018  . 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 History of fall 05/10/2018  . Hyperkalemia 02/21/2018  . Hypotension 02/21/2018  . Sepsis ([place]) 02/21/2018  .  Encounter for central line placement   . Mitral regurgitation 10/30/2017  . Acute kidney injury ([place]) 07/21/2017  . Generalized weakness 07/20/2017  . Shock circulatory ([place])   . Acute respiratory failure with hypoxia ([place])   . Pulmonary edema   . Cardiac arrest ([place]) 06/26/2017  . Ventricular fibrillation ([place])   . Acute on chronic diastolic heart failure ([place])   . Abnormal ECG   . Cellulitis 04/02/2017  . AKI (acute kidney injury) ([place]) 12/17/2016  . Cellulitis and abscess of right lower extremity 12/17/2016  . Syncope and collapse 12/17/2016  . Peripheral edema 12/17/2016  . Hypokalemia 12/17/2016  . Anemia of chronic disease 12/17/2016  . Acute diastolic CHF (congestive heart failure) ([place]) 03/14/2014  . CHF (congestive heart failure) ([place]) 03/12/2014  . Sarcoidosis ([place]) 03/12/2014  . Severe sepsis ([place]) 10/17/2013  . ARF (acute renal failure) ([place]) 10/08/2013  . Cellulitis of multiple sites of lower extremity 10/08/2013  . Morbid obesity ([place]) 10/08/2013  . Volume depletion 10/08/2013  . Venous (peripheral) insufficiency 10/28/2012  . Mediastinal lymphadenopathy 10/16/2012  . Pulmonary nodules 10/16/2012  . 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 Chest pain 09/02/2012  . Asthma   . Hypertension   . Depression   . Venous stasis ulcers ([place]) 07/29/2012  . Varicose veins of lower extremities with ulcer ([place]) 07/08/2012  . Venous ulcer of leg ([place]) 07/08/2012   Past Medical History:  Diagnosis Date  . Asthma   . CHF (congestive heart failure) ([place]) 03/12/2014   a. EF 40-45% by echo in 06/2017 with normal cors by cath. ICD placed following VT arrest  . Depression   . Headache(784.0)   . Hyperlipidemia   . Hypertension   . Lung nodules   . Renal disorder   . Sarcoidosis   . Ulcer    recurring, from chronic venous insufficiency    Family History  Problem Relation Age of Onset  . Heart failure Mother   .  Diabetes Mellitus II Mother   . Hypertension Mother   . Cancer Mother        unknown type  . Heart disease Father   . Stroke Father   . Diabetes Mellitus II Father     Past Surgical History:  Procedure Laterality Date  . cataract surgery Left 07-2013  . I&D EXTREMITY Bilateral 12/31/2018   Procedure: DEBRIDEMENT BILATERAL LEGS, APPLY VAC X 2;  Surgeon: [name], MD;  Location: [facility];  Service: Orthopedics;  Laterality: Bilateral;  . I&D EXTREMITY Bilateral 01/02/2019   Procedure: REPEAT DEBRIDEMENT BILATERAL LEGS, APPLY VAC X 2;  Surgeon: [name], MD;  Location: [facility];  Service: Orthopedics;  Laterality: Bilateral;  . ICD IMPLANT N/A 07/05/2017   Procedure: ICD  Implant;  Surgeon: Constance Haw, MD;  Location: [facility];  Service: Cardiovascular;  Laterality: N/A;  . LEFT HEART CATH AND CORONARY ANGIOGRAPHY N/A 07/03/2017   Procedure: Left Heart Cath and Coronary Angiography;  Surgeon: Belva Crome, MD;  [facility];  Service: Cardiovascular;  Laterality: N/A;  . SKIN SPLIT GRAFT Bilateral 01/07/2019   Procedure: SPLIT THICKNESS SKIN GRAFT BILATERAL LEGS, APPLY VAC;  Surgeon: [name], MD;  Location: [facility];  Service: Orthopedics;  Laterality: Bilateral;   Social History   Occupational History  . Not on file  Tobacco Use  . Smoking status: Never Smoker  . Smokeless tobacco: Never Used  Substance and Sexual Activity  . Alcohol use: No  . Drug use: No  . Sexual activity: Not Currently

## 2019-02-04 ENCOUNTER — Telehealth: Payer: Self-pay | Admitting: Family Medicine

## 2019-02-04 ENCOUNTER — Telehealth (INDEPENDENT_AMBULATORY_CARE_PROVIDER_SITE_OTHER): Payer: Self-pay | Admitting: Orthopedic Surgery

## 2019-02-04 DIAGNOSIS — D509 Iron deficiency anemia, unspecified: Secondary | ICD-10-CM | POA: Diagnosis not present

## 2019-02-04 DIAGNOSIS — N184 Chronic kidney disease, stage 4 (severe): Secondary | ICD-10-CM | POA: Diagnosis not present

## 2019-02-04 DIAGNOSIS — I4901 Ventricular fibrillation: Secondary | ICD-10-CM | POA: Diagnosis not present

## 2019-02-04 DIAGNOSIS — Z95810 Presence of automatic (implantable) cardiac defibrillator: Secondary | ICD-10-CM | POA: Diagnosis not present

## 2019-02-04 DIAGNOSIS — E669 Obesity, unspecified: Secondary | ICD-10-CM | POA: Diagnosis not present

## 2019-02-04 DIAGNOSIS — M10079 Idiopathic gout, unspecified ankle and foot: Secondary | ICD-10-CM | POA: Diagnosis not present

## 2019-02-04 DIAGNOSIS — E559 Vitamin D deficiency, unspecified: Secondary | ICD-10-CM | POA: Diagnosis not present

## 2019-02-04 DIAGNOSIS — Z9181 History of falling: Secondary | ICD-10-CM | POA: Diagnosis not present

## 2019-02-04 DIAGNOSIS — J45909 Unspecified asthma, uncomplicated: Secondary | ICD-10-CM | POA: Diagnosis not present

## 2019-02-04 DIAGNOSIS — E785 Hyperlipidemia, unspecified: Secondary | ICD-10-CM | POA: Diagnosis not present

## 2019-02-04 DIAGNOSIS — Z7982 Long term (current) use of aspirin: Secondary | ICD-10-CM | POA: Diagnosis not present

## 2019-02-04 DIAGNOSIS — I255 Ischemic cardiomyopathy: Secondary | ICD-10-CM | POA: Diagnosis not present

## 2019-02-04 DIAGNOSIS — D631 Anemia in chronic kidney disease: Secondary | ICD-10-CM | POA: Diagnosis not present

## 2019-02-04 DIAGNOSIS — Z6836 Body mass index (BMI) 36.0-36.9, adult: Secondary | ICD-10-CM | POA: Diagnosis not present

## 2019-02-04 DIAGNOSIS — F329 Major depressive disorder, single episode, unspecified: Secondary | ICD-10-CM | POA: Diagnosis not present

## 2019-02-04 DIAGNOSIS — I70209 Unspecified atherosclerosis of native arteries of extremities, unspecified extremity: Secondary | ICD-10-CM | POA: Diagnosis not present

## 2019-02-04 DIAGNOSIS — L03115 Cellulitis of right lower limb: Secondary | ICD-10-CM | POA: Diagnosis not present

## 2019-02-04 DIAGNOSIS — L03116 Cellulitis of left lower limb: Secondary | ICD-10-CM | POA: Diagnosis not present

## 2019-02-04 DIAGNOSIS — I130 Hypertensive heart and chronic kidney disease with heart failure and stage 1 through stage 4 chronic kidney disease, or unspecified chronic kidney disease: Secondary | ICD-10-CM | POA: Diagnosis not present

## 2019-02-04 DIAGNOSIS — R41841 Cognitive communication deficit: Secondary | ICD-10-CM | POA: Diagnosis not present

## 2019-02-04 DIAGNOSIS — I051 Rheumatic mitral insufficiency: Secondary | ICD-10-CM | POA: Diagnosis not present

## 2019-02-04 DIAGNOSIS — I5040 Unspecified combined systolic (congestive) and diastolic (congestive) heart failure: Secondary | ICD-10-CM | POA: Diagnosis not present

## 2019-02-04 DIAGNOSIS — I872 Venous insufficiency (chronic) (peripheral): Secondary | ICD-10-CM | POA: Diagnosis not present

## 2019-02-04 NOTE — Telephone Encounter (Signed)
[name] from [facility] called stating that the patient has returned home from the Rehab facility and the orders that they received said that Dr. Sharol Given wanted to do the wound care in the office, he is requesting clarification.  CB#[phone].  Thank you.

## 2019-02-04 NOTE — Telephone Encounter (Signed)
I called and sw [name] to advise that dynaflex compression wraps had been applied and the pt had expressed a desire to have them changed here weekly. As long as the dressings are not soiled or wet or rolling down her leg they can remain intact until office visit next week.

## 2019-02-04 NOTE — Telephone Encounter (Signed)
Copied from [place] #[id number]. Topic: Quick Communication - Home Health Verbal Orders >> Feb 04, 2019  2:40 PM Berneta Levins wrote: Caller/Agency: [name] with [facility] Number: [phone], OK to leave a message Requesting OT/PT/Skilled Nursing/Social Work: Nursing, PT, OT, and the OK to take orders from Dr. Meridee Score for wound care Frequency:  Nursing - 1 week 4 with 2 as needed PT and OT - evaluation only

## 2019-02-05 ENCOUNTER — Telehealth: Payer: Self-pay | Admitting: Family Medicine

## 2019-02-05 DIAGNOSIS — L03116 Cellulitis of left lower limb: Secondary | ICD-10-CM | POA: Diagnosis not present

## 2019-02-05 DIAGNOSIS — L03115 Cellulitis of right lower limb: Secondary | ICD-10-CM | POA: Diagnosis not present

## 2019-02-05 DIAGNOSIS — I130 Hypertensive heart and chronic kidney disease with heart failure and stage 1 through stage 4 chronic kidney disease, or unspecified chronic kidney disease: Secondary | ICD-10-CM | POA: Diagnosis not present

## 2019-02-05 DIAGNOSIS — I5040 Unspecified combined systolic (congestive) and diastolic (congestive) heart failure: Secondary | ICD-10-CM | POA: Diagnosis not present

## 2019-02-05 DIAGNOSIS — I872 Venous insufficiency (chronic) (peripheral): Secondary | ICD-10-CM | POA: Diagnosis not present

## 2019-02-05 DIAGNOSIS — N184 Chronic kidney disease, stage 4 (severe): Secondary | ICD-10-CM | POA: Diagnosis not present

## 2019-02-05 NOTE — Telephone Encounter (Signed)
Rayn calling back from [facility] called to check status.

## 2019-02-05 NOTE — Telephone Encounter (Unsigned)
Copied from [place] [phone]. Topic: Quick Communication - Home Health Verbal Orders >> Feb 05, 2019 12:29 PM Alanda Slim E wrote: Caller/Agency: [facility] Number: [phone]([id number] VM) Requesting PT Frequency: 3x a week for 4 weeks

## 2019-02-06 DIAGNOSIS — I872 Venous insufficiency (chronic) (peripheral): Secondary | ICD-10-CM | POA: Diagnosis not present

## 2019-02-06 DIAGNOSIS — L03116 Cellulitis of left lower limb: Secondary | ICD-10-CM | POA: Diagnosis not present

## 2019-02-06 DIAGNOSIS — I5040 Unspecified combined systolic (congestive) and diastolic (congestive) heart failure: Secondary | ICD-10-CM | POA: Diagnosis not present

## 2019-02-06 DIAGNOSIS — N184 Chronic kidney disease, stage 4 (severe): Secondary | ICD-10-CM | POA: Diagnosis not present

## 2019-02-06 DIAGNOSIS — L03115 Cellulitis of right lower limb: Secondary | ICD-10-CM | POA: Diagnosis not present

## 2019-02-06 DIAGNOSIS — I130 Hypertensive heart and chronic kidney disease with heart failure and stage 1 through stage 4 chronic kidney disease, or unspecified chronic kidney disease: Secondary | ICD-10-CM | POA: Diagnosis not present

## 2019-02-09 ENCOUNTER — Telehealth (INDEPENDENT_AMBULATORY_CARE_PROVIDER_SITE_OTHER): Payer: Self-pay | Admitting: Orthopedic Surgery

## 2019-02-09 NOTE — Telephone Encounter (Signed)
[name] with [facility] calling to check status on receiving a call back with orders.

## 2019-02-09 NOTE — Telephone Encounter (Signed)
[name]-nurse with [facility] called needing to know exactly where is the skin graft? Thurmond Butts said they are going out to see the patient in her home and need to prepare for the visit. The number to contact Thurmond Butts is [phone]

## 2019-02-09 NOTE — Telephone Encounter (Signed)
[name] called back. States he would like to know where exactly the skin graft was harvested from?  CB [phone]

## 2019-02-09 NOTE — Telephone Encounter (Signed)
Will hold, pt has appt 02/10/2019

## 2019-02-10 ENCOUNTER — Telehealth (INDEPENDENT_AMBULATORY_CARE_PROVIDER_SITE_OTHER): Payer: Self-pay | Admitting: Orthopedic Surgery

## 2019-02-10 ENCOUNTER — Telehealth (INDEPENDENT_AMBULATORY_CARE_PROVIDER_SITE_OTHER): Payer: Self-pay

## 2019-02-10 ENCOUNTER — Encounter (INDEPENDENT_AMBULATORY_CARE_PROVIDER_SITE_OTHER): Payer: Self-pay | Admitting: Physician Assistant

## 2019-02-10 ENCOUNTER — Telehealth: Payer: Self-pay | Admitting: Family Medicine

## 2019-02-10 ENCOUNTER — Ambulatory Visit (INDEPENDENT_AMBULATORY_CARE_PROVIDER_SITE_OTHER): Payer: Medicare Other | Admitting: Physician Assistant

## 2019-02-10 VITALS — Ht 67.0 in | Wt 248.0 lb

## 2019-02-10 DIAGNOSIS — I5040 Unspecified combined systolic (congestive) and diastolic (congestive) heart failure: Secondary | ICD-10-CM | POA: Diagnosis not present

## 2019-02-10 DIAGNOSIS — L03116 Cellulitis of left lower limb: Secondary | ICD-10-CM | POA: Diagnosis not present

## 2019-02-10 DIAGNOSIS — Z945 Skin transplant status: Secondary | ICD-10-CM | POA: Diagnosis not present

## 2019-02-10 DIAGNOSIS — L97919 Non-pressure chronic ulcer of unspecified part of right lower leg with unspecified severity: Secondary | ICD-10-CM | POA: Diagnosis not present

## 2019-02-10 DIAGNOSIS — N184 Chronic kidney disease, stage 4 (severe): Secondary | ICD-10-CM | POA: Diagnosis not present

## 2019-02-10 DIAGNOSIS — L03115 Cellulitis of right lower limb: Secondary | ICD-10-CM | POA: Diagnosis not present

## 2019-02-10 DIAGNOSIS — I872 Venous insufficiency (chronic) (peripheral): Secondary | ICD-10-CM | POA: Diagnosis not present

## 2019-02-10 DIAGNOSIS — N183 Chronic kidney disease, stage 3 unspecified: Secondary | ICD-10-CM

## 2019-02-10 DIAGNOSIS — I130 Hypertensive heart and chronic kidney disease with heart failure and stage 1 through stage 4 chronic kidney disease, or unspecified chronic kidney disease: Secondary | ICD-10-CM | POA: Diagnosis not present

## 2019-02-10 DIAGNOSIS — L97929 Non-pressure chronic ulcer of unspecified part of left lower leg with unspecified severity: Secondary | ICD-10-CM

## 2019-02-10 DIAGNOSIS — I87333 Chronic venous hypertension (idiopathic) with ulcer and inflammation of bilateral lower extremity: Secondary | ICD-10-CM | POA: Diagnosis not present

## 2019-02-10 NOTE — Telephone Encounter (Signed)
Thurmond Butts was called and refer to notes

## 2019-02-10 NOTE — Telephone Encounter (Signed)
I called Kari [name] to inform him that we will not be needing any wound care assistance at this time and we will be doing pt's Dynaflex drsg changes in our office. He understood.

## 2019-02-10 NOTE — Telephone Encounter (Signed)
Verbal given 

## 2019-02-10 NOTE — Telephone Encounter (Signed)
Done

## 2019-02-10 NOTE — Telephone Encounter (Unsigned)
Copied from [place] [phone]. Topic: Quick Communication - Home Health Verbal Orders >> Feb 10, 2019  9:49 AM Carolyn Stare wrote: Caller/Agency  Marissa an OT [facility] Number [phone]     Requesting verbal order  to continue with OT   Frequency     2 x 3

## 2019-02-10 NOTE — Telephone Encounter (Signed)
[name]-nurse with [facility] called advised patient want to continue to come to the clinic for her wound care. [name] advised if patient is coming to clinic for wound care nursing for wound care from [facility] is not needed.  Thurmond Butts also asked exactly where did they harvest the grafts to put on patient's legs? The number to contact Thurmond Butts is [phone]

## 2019-02-10 NOTE — Progress Notes (Signed)
Office Visit Note   Patient: Kari [name]           Date of Birth: 05/31/1961           MRN: [id number] Visit Date: 02/10/2019              Requested by: Rutherford Guys, MD [address] [place], [place] [address] PCP: Rutherford Guys, MD  Chief Complaint  Patient presents with  . Left Leg - Routine Post Op  . Right Leg - Routine Post Op      HPI: The patient is a 58 year old woman who is seen for postoperative follow-up following split thickness skin grafting with allograft to her very large bilateral lower extremity ulcers on 01/07/2019.  She has been discharged home.  We have been utilizing Dynaflex compression wraps for edema control and to aid in the healing of the graft sites.  Assessment & Plan: Visit Diagnoses:  1. Status post skin graft   2. Idiopathic chronic venous hypertension of both lower extremities with ulcer and inflammation ([place])   3. CKD (chronic kidney disease), stage III ([place])     Plan: A portion of the residual staples were removed.  She still has some staples remaining as she could not tolerate further removal.  We applied Adaptic to the graft sites and Dynaflex wraps were reapplied today and she will follow-up in 1 week.  Follow-Up Instructions: Return in about 1 week (around 02/17/2019).   Ortho Exam  Patient is alert, oriented, no adenopathy, well-dressed, normal affect, normal respiratory effort. Bilateral lower leg grafts are incorporating well.  There is moderate bilateral lower extremity edema and moderate serosanguineous drainage but no signs of cellulitis.  Her edema is improving.  A portion of her staples were removed today.  Imaging: No results found.    Labs: Lab Results  Component Value Date   HGBA1C 5.6 12/17/2016   HGBA1C 5.5 03/12/2014   HGBA1C 6.0 (H) 06/21/2012   REPTSTATUS 12/30/2018 FINAL 12/25/2018   GRAMSTAIN  06/27/2017    ABUNDANT WBC PRESENT,BOTH PMN AND MONONUCLEAR RARE GRAM NEGATIVE RODS RARE GRAM NEGATIVE  COCCI IN PAIRS    CULT  12/25/2018    NO GROWTH 5 DAYS Performed at [facility], [place] [address] [place], [place] [address]      Lab Results  Component Value Date   ALBUMIN 2.2 (L) 12/25/2018   ALBUMIN 3.4 (L) 09/11/2018   ALBUMIN 3.3 (L) 05/10/2018    Body mass index is 38.84 kg/m.  Orders:  No orders of the defined types were placed in this encounter.  No orders of the defined types were placed in this encounter.    Procedures: No procedures performed  Clinical Data: No additional findings.  ROS:  All other systems negative, except as noted in the HPI. Review of Systems  Objective: Vital Signs: Ht 5\' 7"  (1.702 m)   Wt 248 lb (112.5 kg)   LMP 11/21/2011   BMI 38.84 kg/m   Specialty Comments:  No specialty comments available.  PMFS History: Patient Active Problem List   Diagnosis Date Noted  . CKD (chronic kidney disease), stage III ([place]) 01/13/2019  . Infected wound 12/26/2018  . Idiopathic chronic venous hypertension of both lower extremities with ulcer and inflammation ([place])   . History of fall 05/10/2018  . Hyperkalemia 02/21/2018  . Hypotension 02/21/2018  . Sepsis ([place]) 02/21/2018  . Encounter for central line placement   . Mitral regurgitation 10/30/2017  . Acute kidney injury (  [place]) 07/21/2017  . Generalized weakness 07/20/2017  . Shock circulatory ([place])   . Acute respiratory failure with hypoxia ([place])   . Pulmonary edema   . Cardiac arrest ([place]) 06/26/2017  . Ventricular fibrillation ([place])   . Acute on chronic diastolic heart failure ([place])   . Abnormal ECG   . Cellulitis 04/02/2017  . AKI (acute kidney injury) ([place]) 12/17/2016  . Cellulitis and abscess of right lower extremity 12/17/2016  . Syncope and collapse 12/17/2016  . Peripheral edema 12/17/2016  . Hypokalemia 12/17/2016  . Anemia of chronic disease 12/17/2016  . Acute diastolic CHF (congestive heart failure) ([place]) 03/14/2014  . CHF (congestive heart failure) ([place])  03/12/2014  . Sarcoidosis ([place]) 03/12/2014  . Severe sepsis ([place]) 10/17/2013  . ARF (acute renal failure) ([place]) 10/08/2013  . Cellulitis of multiple sites of lower extremity 10/08/2013  . Morbid obesity ([place]) 10/08/2013  . Volume depletion 10/08/2013  . Venous (peripheral) insufficiency 10/28/2012  . Mediastinal lymphadenopathy 10/16/2012  . Pulmonary nodules 10/16/2012  . 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 Chest pain 09/02/2012  . Asthma   . Hypertension   . Depression   . Venous stasis ulcers ([place]) 07/29/2012  . Varicose veins of lower extremities with ulcer ([place]) 07/08/2012  . Venous ulcer of leg ([place]) 07/08/2012   Past Medical History:  Diagnosis Date  . Asthma   . CHF (congestive heart failure) ([place]) 03/12/2014   a. EF 40-45% by echo in 06/2017 with normal cors by cath. ICD placed following VT arrest  . Depression   . Headache(784.0)   . Hyperlipidemia   . Hypertension   . Lung nodules   . Renal disorder   . Sarcoidosis   . Ulcer    recurring, from chronic venous insufficiency    Family History  Problem Relation Age of Onset  . Heart failure Mother   . Diabetes Mellitus II Mother   . Hypertension Mother   . Cancer Mother        unknown type  . Heart disease Father   . Stroke Father   . Diabetes Mellitus II Father     Past Surgical History:  Procedure Laterality Date  . cataract surgery Left 07-2013  . I&D EXTREMITY Bilateral 12/31/2018   Procedure: DEBRIDEMENT BILATERAL LEGS, APPLY VAC X 2;  Surgeon: [name], MD;  Location: [facility];  Service: Orthopedics;  Laterality: Bilateral;  . I&D EXTREMITY Bilateral 01/02/2019   Procedure: REPEAT DEBRIDEMENT BILATERAL LEGS, APPLY VAC X 2;  Surgeon: [name], MD;  Location: [facility];  Service: Orthopedics;  Laterality: Bilateral;  . ICD IMPLANT N/A 07/05/2017   Procedure: ICD Implant;  Surgeon: Constance Haw, MD;  Location: [facility];  Service:  Cardiovascular;  Laterality: N/A;  . LEFT HEART CATH AND CORONARY ANGIOGRAPHY N/A 07/03/2017   Procedure: Left Heart Cath and Coronary Angiography;  Surgeon: Belva Crome, MD;  Location: [facility];  Service: Cardiovascular;  Laterality: N/A;  . SKIN SPLIT GRAFT Bilateral 01/07/2019   Procedure: SPLIT THICKNESS SKIN GRAFT BILATERAL LEGS, APPLY VAC;  Surgeon: [name], MD;  Location: [facility];  Service: Orthopedics;  Laterality: Bilateral;   Social History   Occupational History  . Not on file  Tobacco Use  . Smoking status: Never Smoker  . Smokeless tobacco: Never Used  Substance and Sexual Activity  . Alcohol use: No  . Drug use: No  . Sexual activity: Not Currently

## 2019-02-11 ENCOUNTER — Encounter: Payer: Self-pay | Admitting: Family Medicine

## 2019-02-11 ENCOUNTER — Other Ambulatory Visit: Payer: Self-pay

## 2019-02-11 ENCOUNTER — Ambulatory Visit (INDEPENDENT_AMBULATORY_CARE_PROVIDER_SITE_OTHER): Payer: Medicare Other | Admitting: Family Medicine

## 2019-02-11 VITALS — BP 120/71 | HR 70 | Temp 98.0°F | Resp 16 | Ht 67.0 in | Wt 268.0 lb

## 2019-02-11 DIAGNOSIS — Z1231 Encounter for screening mammogram for malignant neoplasm of breast: Secondary | ICD-10-CM

## 2019-02-11 DIAGNOSIS — N183 Chronic kidney disease, stage 3 unspecified: Secondary | ICD-10-CM

## 2019-02-11 DIAGNOSIS — L97919 Non-pressure chronic ulcer of unspecified part of right lower leg with unspecified severity: Secondary | ICD-10-CM | POA: Diagnosis not present

## 2019-02-11 DIAGNOSIS — L97929 Non-pressure chronic ulcer of unspecified part of left lower leg with unspecified severity: Secondary | ICD-10-CM

## 2019-02-11 DIAGNOSIS — J302 Other seasonal allergic rhinitis: Secondary | ICD-10-CM | POA: Diagnosis not present

## 2019-02-11 DIAGNOSIS — Z1211 Encounter for screening for malignant neoplasm of colon: Secondary | ICD-10-CM

## 2019-02-11 DIAGNOSIS — D649 Anemia, unspecified: Secondary | ICD-10-CM | POA: Diagnosis not present

## 2019-02-11 MED ORDER — FLUTICASONE PROPIONATE 50 MCG/ACT NA SUSP: 1.0000 | Freq: Two times a day (BID) | NASAL | 6 refills | Status: DC

## 2019-02-11 NOTE — Progress Notes (Signed)
2/19/202012:09 PM  [place] 1961/05/22, 58 y.o. female [id number]  Chief Complaint  Patient presents with  . Hospitalization Follow-up  . Cough    with some nasal drainage and congestion since yesterday     HPI:   Patient is a 58 y.o. female with past medical history significant for s/p cardiac arrest in 2018 now with ACID,CHF, CKD3, venous stasis ulcerswho presents today forhospital follow-up  Hospitalized from 1/2-1/17 2020 for BLE purulent cellulitis of venous stasis ulcers, had I+D x 2, s/p split thickness skin grafts by Dr Sharol Given She was given 1PRBCs for hgb < 8.0 She was then transferred to SNF for 2 weeks due to wound vacs She is now back home All wound care to be done at Dr Duda's office Home health involved for PT and OT She is weaning herself off pain medications now that she is back home, just takes prior to wound care She is on a diet, has been losing weight Using a cane, legs in dressings She was not getting KCL at the nursing home  Patient is wondering if she can get more hours for home aide services, she currently patient gets 88 hours/month Agency: [facility] care, [phone], [place] helps with meal preparation, bathing, house cleaning, laundry  Started having, coughing, runny nose and scratchy throat yesterday Having some sneezing, watery eyes Taking claritin every day No SOB, dry cough No fever or chills  Sees cards in march and renal in april Had tamiflu in nursing home  Fall Risk  02/11/2019 09/11/2018 05/10/2018 03/24/2018 03/04/2018  Falls in the past year? 0 No Yes Yes No  Number falls in past yr: - - 2 or more 1 -  Comment - - - patient stepped into hole outside apartment -  Injury with Fall? 0 - No Yes -  Comment - - - soft tissue injury resolved without intervention -  Risk for fall due to : - - - History of fall(s);Impaired mobility -  Risk for fall due to: Comment - - - - -  Follow up - - - Falls prevention discussed -       Depression screen [facility] 2/9 02/11/2019 09/11/2018 05/10/2018  Decreased Interest 0 0 0  Down, Depressed, Hopeless 0 0 0  PHQ - 2 Score 0 0 0  Altered sleeping - - -  Tired, decreased energy - - -  Change in appetite - - -  Feeling bad or failure about yourself  - - -  Trouble concentrating - - -  Moving slowly or fidgety/restless - - -  Suicidal thoughts - - -  PHQ-9 Score - - -  Difficult doing work/chores - - -    Allergies  Allergen Reactions  . Tramadol Other (See Comments)    hallucination    Prior to Admission medications   Medication Sig Start Date End Date Taking? Authorizing Provider  acetaminophen (TYLENOL) 500 MG tablet Take 1,000 mg by mouth every 6 (six) hours as needed for mild pain.   Yes [provider]  albuterol (PROVENTIL HFA;VENTOLIN HFA) 108 (90 Base) MCG/ACT inhaler Inhale 2 puffs into the lungs every 6 (six) hours as needed for shortness of breath. 02/02/19  Yes Medina-Vargas, Monina C, NP  aspirin EC 81 MG tablet Take 81 mg by mouth daily.   Yes [provider]  carvedilol (COREG) 6.25 MG tablet Take 1 tablet (6.25 mg total) by mouth 2 (two) times daily. 02/02/19  Yes Medina-Vargas, Monina C, NP  Cholecalciferol (VITAMIN  D) 50 MCG (2000 UT) CAPS Take 2,000 Units by mouth daily.   Yes [provider]  febuxostat (ULORIC) 40 MG tablet Take 1 tablet (40 mg total) by mouth daily. 02/02/19  Yes Medina-Vargas, Monina C, NP  ferrous sulfate 325 (65 FE) MG tablet Take 325 mg by mouth daily with breakfast.   Yes [provider]  furosemide (LASIX) 40 MG tablet Take 1.5 tablets (60 mg total) by mouth daily. Take 1-1/2 tablets to = 60 mg 02/02/19  Yes Medina-Vargas, Monina C, NP  HYDROcodone-acetaminophen (NORCO/VICODIN) 5-325 MG tablet Take 1 tablet by mouth every 6 (six) hours as needed for moderate pain. 02/02/19  Yes Rayburn, Neta Mends, PA-C  loratadine (CLARITIN) 10 MG tablet Take 1 tablet (10 mg total) by mouth daily as needed  for allergies. 02/02/19  Yes Medina-Vargas, Monina C, NP    Past Medical History:  Diagnosis Date  . Asthma   . CHF (congestive heart failure) ([place]) 03/12/2014   a. EF 40-45% by echo in 06/2017 with normal cors by cath. ICD placed following VT arrest  . Depression   . Headache(784.0)   . Hyperlipidemia   . Hypertension   . Lung nodules   . Renal disorder   . Sarcoidosis   . Ulcer    recurring, from chronic venous insufficiency    Past Surgical History:  Procedure Laterality Date  . cataract surgery Left 07-2013  . I&D EXTREMITY Bilateral 12/31/2018   Procedure: DEBRIDEMENT BILATERAL LEGS, APPLY VAC X 2;  Surgeon: [name], MD;  Location: [facility];  Service: Orthopedics;  Laterality: Bilateral;  . I&D EXTREMITY Bilateral 01/02/2019   Procedure: REPEAT DEBRIDEMENT BILATERAL LEGS, APPLY VAC X 2;  Surgeon: [name], MD;  Location: [facility];  Service: Orthopedics;  Laterality: Bilateral;  . ICD IMPLANT N/A 07/05/2017   Procedure: ICD Implant;  Surgeon: Constance Haw, MD;  Location: [facility];  Service: Cardiovascular;  Laterality: N/A;  . LEFT HEART CATH AND CORONARY ANGIOGRAPHY N/A 07/03/2017   Procedure: Left Heart Cath and Coronary Angiography;  Surgeon: Belva Crome, MD;  Location: [facility];  Service: Cardiovascular;  Laterality: N/A;  . SKIN SPLIT GRAFT Bilateral 01/07/2019   Procedure: SPLIT THICKNESS SKIN GRAFT BILATERAL LEGS, APPLY VAC;  Surgeon: [name], MD;  Location: [facility];  Service: Orthopedics;  Laterality: Bilateral;    Social History   Tobacco Use  . Smoking status: Never Smoker  . Smokeless tobacco: Never Used  Substance Use Topics  . Alcohol use: No    Family History  Problem Relation Age of Onset  . Heart failure Mother   . Diabetes Mellitus II Mother   . Hypertension Mother   . Cancer Mother        unknown type  . Heart disease Father   . Stroke Father   . Diabetes Mellitus II Father     Review of Systems   Constitutional: Negative for chills and fever.  Respiratory: Negative for cough and shortness of breath.   Cardiovascular: Negative for chest pain, palpitations and leg swelling.  Gastrointestinal: Negative for abdominal pain, nausea and vomiting.     OBJECTIVE:  Blood pressure 120/71, pulse 70, temperature 98 F (36.7 C), temperature source Oral, resp. rate 16, height 5\' 7"  (1.702 m), weight 268 lb (121.6 kg), last menstrual period 11/21/2011, SpO2 98 %. Body mass index is 41.97 kg/m.   Wt Readings from Last 3 Encounters:  02/11/19 268 lb (121.6 kg)  02/10/19 248 lb (112.5 kg)  02/02/19 248 lb (112.5 kg)    Physical Exam Vitals signs and nursing note reviewed.  Constitutional:      Appearance: She is well-developed.  HENT:     Head: Normocephalic and atraumatic.     Right Ear: Hearing, tympanic membrane, ear canal and external ear normal.     Left Ear: Hearing, tympanic membrane, ear canal and external ear normal.  Eyes:     Conjunctiva/sclera: Conjunctivae normal.     Pupils: Pupils are equal, round, and reactive to light.  Neck:     Musculoskeletal: Neck supple.  Cardiovascular:     Rate and Rhythm: Normal rate and regular rhythm.     Heart sounds: Normal heart sounds. No murmur. No friction rub. No gallop.   Pulmonary:     Effort: Pulmonary effort is normal.     Breath sounds: Normal breath sounds. No wheezing or rales.  Musculoskeletal:     Comments: BLE wrapped in clean dressings.  Lymphadenopathy:     Cervical: No cervical adenopathy.  Skin:    General: Skin is warm and dry.  Neurological:     Mental Status: She is alert and oriented to person, place, and time.    ASSESSMENT and PLAN  1. Ulcers of both lower extremities, unspecified ulcer stage (HCC) S/p skin grafts, managed by Dr Sharol Given, overall doing better, recovering well. Cont with weight loss and active mgt of venous stasis. Limited in what she is able to do at this time given wound care needs and  recommendation of elevation legs several times a day.  2. Seasonal allergies Not controlled. Adding flonase  3. Visit for screening mammogram  4. Screening for colon cancer  5. Stage 3 chronic kidney disease ([place]) Managed by renal. Last crt improving. Checking iron levels given recent iron infusion - CBC - Iron, TIBC and Ferritin Panel - Comprehensive metabolic panel  6. Anemia, unspecified type - CBC - Iron, TIBC and Ferritin Panel  Other orders - fluticasone (FLONASE) 50 MCG/ACT nasal spray; Place 1 spray into both nostrils 2 (two) times daily.  Return in about 3 months (around 05/12/2019).    Rutherford Guys, MD Primary Care at [place], [place] [address] Ph.  [phone] Fax [phone]

## 2019-02-11 NOTE — Patient Instructions (Signed)
° ° ° °  If you have lab work done today you will be contacted with your lab results within the next 2 weeks.  If you have not heard from us then please contact us. The fastest way to get your results is to register for My Chart. ° ° °IF you received an x-ray today, you will receive an invoice from [facility]. Please contact [facility] at [phone] with questions or concerns regarding your invoice.  ° °IF you received labwork today, you will receive an invoice from [facility]. Please contact [facility] at [phone] with questions or concerns regarding your invoice.  ° °Our billing staff will not be able to assist you with questions regarding bills from these companies. ° °You will be contacted with the lab results as soon as they are available. The fastest way to get your results is to activate your My Chart account. Instructions are located on the last page of this paperwork. If you have not heard from us regarding the results in 2 weeks, please contact this office. °  ° ° ° °

## 2019-02-12 DIAGNOSIS — L03115 Cellulitis of right lower limb: Secondary | ICD-10-CM | POA: Diagnosis not present

## 2019-02-12 DIAGNOSIS — I872 Venous insufficiency (chronic) (peripheral): Secondary | ICD-10-CM | POA: Diagnosis not present

## 2019-02-12 DIAGNOSIS — N184 Chronic kidney disease, stage 4 (severe): Secondary | ICD-10-CM | POA: Diagnosis not present

## 2019-02-12 DIAGNOSIS — I5040 Unspecified combined systolic (congestive) and diastolic (congestive) heart failure: Secondary | ICD-10-CM | POA: Diagnosis not present

## 2019-02-12 DIAGNOSIS — L03116 Cellulitis of left lower limb: Secondary | ICD-10-CM | POA: Diagnosis not present

## 2019-02-12 DIAGNOSIS — I130 Hypertensive heart and chronic kidney disease with heart failure and stage 1 through stage 4 chronic kidney disease, or unspecified chronic kidney disease: Secondary | ICD-10-CM | POA: Diagnosis not present

## 2019-02-12 LAB — COMPREHENSIVE METABOLIC PANEL
ALT: 11 IU/L (ref 0–32)
AST: 17 IU/L (ref 0–40)
Albumin/Globulin Ratio: 0.9 — ABNORMAL LOW (ref 1.2–2.2)
Albumin: 3.6 g/dL — ABNORMAL LOW (ref 3.8–4.9)
Alkaline Phosphatase: 75 IU/L (ref 39–117)
BUN/Creatinine Ratio: 11 (ref 9–23)
BUN: 15 mg/dL (ref 6–24)
Bilirubin Total: 0.3 mg/dL (ref 0.0–1.2)
CO2: 25 mmol/L (ref 20–29)
Calcium: 9.5 mg/dL (ref 8.7–10.2)
Chloride: 101 mmol/L (ref 96–106)
Creatinine, Ser: 1.32 mg/dL — ABNORMAL HIGH (ref 0.57–1.00)
GFR calc Af Amer: 52 mL/min/{1.73_m2} — ABNORMAL LOW (ref >59)
GFR calc non Af Amer: 45 mL/min/{1.73_m2} — ABNORMAL LOW (ref >59)
Globulin, Total: 3.8 g/dL (ref 1.5–4.5)
Glucose: 93 mg/dL (ref 65–99)
Potassium: 3.6 mmol/L (ref 3.5–5.2)
Sodium: 144 mmol/L (ref 134–144)
Total Protein: 7.4 g/dL (ref 6.0–8.5)

## 2019-02-12 LAB — CBC
Hematocrit: 29.2 % — ABNORMAL LOW (ref 34.0–46.6)
Hemoglobin: 9.7 g/dL — ABNORMAL LOW (ref 11.1–15.9)
MCH: 29.1 pg (ref 26.6–33.0)
MCHC: 33.2 g/dL (ref 31.5–35.7)
MCV: 88 fL (ref 79–97)
Platelets: 335 10*3/uL (ref 150–450)
RBC: 3.33 x10E6/uL — ABNORMAL LOW (ref 3.77–5.28)
RDW: 16.2 % — ABNORMAL HIGH (ref 11.7–15.4)
WBC: 8.3 10*3/uL (ref 3.4–10.8)

## 2019-02-12 LAB — IRON,TIBC AND FERRITIN PANEL
Ferritin: 122 ng/mL (ref 15–150)
Iron Saturation: 17 % (ref 15–55)
Iron: 42 ug/dL (ref 27–159)
Total Iron Binding Capacity: 251 ug/dL (ref 250–450)
UIBC: 209 ug/dL (ref 131–425)

## 2019-02-13 DIAGNOSIS — L03115 Cellulitis of right lower limb: Secondary | ICD-10-CM | POA: Diagnosis not present

## 2019-02-13 DIAGNOSIS — I872 Venous insufficiency (chronic) (peripheral): Secondary | ICD-10-CM | POA: Diagnosis not present

## 2019-02-13 DIAGNOSIS — L03116 Cellulitis of left lower limb: Secondary | ICD-10-CM | POA: Diagnosis not present

## 2019-02-13 DIAGNOSIS — N184 Chronic kidney disease, stage 4 (severe): Secondary | ICD-10-CM | POA: Diagnosis not present

## 2019-02-13 DIAGNOSIS — I130 Hypertensive heart and chronic kidney disease with heart failure and stage 1 through stage 4 chronic kidney disease, or unspecified chronic kidney disease: Secondary | ICD-10-CM | POA: Diagnosis not present

## 2019-02-13 DIAGNOSIS — I5040 Unspecified combined systolic (congestive) and diastolic (congestive) heart failure: Secondary | ICD-10-CM | POA: Diagnosis not present

## 2019-02-16 DIAGNOSIS — I5040 Unspecified combined systolic (congestive) and diastolic (congestive) heart failure: Secondary | ICD-10-CM | POA: Diagnosis not present

## 2019-02-16 DIAGNOSIS — L03116 Cellulitis of left lower limb: Secondary | ICD-10-CM | POA: Diagnosis not present

## 2019-02-16 DIAGNOSIS — N184 Chronic kidney disease, stage 4 (severe): Secondary | ICD-10-CM | POA: Diagnosis not present

## 2019-02-16 DIAGNOSIS — L03115 Cellulitis of right lower limb: Secondary | ICD-10-CM | POA: Diagnosis not present

## 2019-02-16 DIAGNOSIS — I130 Hypertensive heart and chronic kidney disease with heart failure and stage 1 through stage 4 chronic kidney disease, or unspecified chronic kidney disease: Secondary | ICD-10-CM | POA: Diagnosis not present

## 2019-02-16 DIAGNOSIS — I872 Venous insufficiency (chronic) (peripheral): Secondary | ICD-10-CM | POA: Diagnosis not present

## 2019-02-17 ENCOUNTER — Encounter (INDEPENDENT_AMBULATORY_CARE_PROVIDER_SITE_OTHER): Payer: Self-pay | Admitting: Physician Assistant

## 2019-02-17 ENCOUNTER — Ambulatory Visit (INDEPENDENT_AMBULATORY_CARE_PROVIDER_SITE_OTHER): Payer: Medicare Other | Admitting: Physician Assistant

## 2019-02-17 VITALS — Ht 67.0 in | Wt 268.0 lb

## 2019-02-17 DIAGNOSIS — N183 Chronic kidney disease, stage 3 unspecified: Secondary | ICD-10-CM

## 2019-02-17 DIAGNOSIS — I5040 Unspecified combined systolic (congestive) and diastolic (congestive) heart failure: Secondary | ICD-10-CM | POA: Diagnosis not present

## 2019-02-17 DIAGNOSIS — I87333 Chronic venous hypertension (idiopathic) with ulcer and inflammation of bilateral lower extremity: Secondary | ICD-10-CM

## 2019-02-17 DIAGNOSIS — N184 Chronic kidney disease, stage 4 (severe): Secondary | ICD-10-CM | POA: Diagnosis not present

## 2019-02-17 DIAGNOSIS — L97929 Non-pressure chronic ulcer of unspecified part of left lower leg with unspecified severity: Secondary | ICD-10-CM

## 2019-02-17 DIAGNOSIS — L97919 Non-pressure chronic ulcer of unspecified part of right lower leg with unspecified severity: Secondary | ICD-10-CM

## 2019-02-17 DIAGNOSIS — L03116 Cellulitis of left lower limb: Secondary | ICD-10-CM | POA: Diagnosis not present

## 2019-02-17 DIAGNOSIS — I872 Venous insufficiency (chronic) (peripheral): Secondary | ICD-10-CM | POA: Diagnosis not present

## 2019-02-17 DIAGNOSIS — L03115 Cellulitis of right lower limb: Secondary | ICD-10-CM | POA: Diagnosis not present

## 2019-02-17 DIAGNOSIS — Z945 Skin transplant status: Secondary | ICD-10-CM

## 2019-02-17 DIAGNOSIS — I130 Hypertensive heart and chronic kidney disease with heart failure and stage 1 through stage 4 chronic kidney disease, or unspecified chronic kidney disease: Secondary | ICD-10-CM | POA: Diagnosis not present

## 2019-02-17 DIAGNOSIS — Z6841 Body Mass Index (BMI) 40.0 and over, adult: Secondary | ICD-10-CM

## 2019-02-17 NOTE — Progress Notes (Signed)
Office Visit Note   Patient: Kari [name]           Date of Birth: Apr 23, 1961           MRN: [id number] Visit Date: 02/17/2019              Requested by: Rutherford Guys, MD [address] [place], [place] [address] PCP: Rutherford Guys, MD  Chief Complaint  Patient presents with  . Left Leg - Routine Post Op    01/07/2019 BLE debridement and STSG  . Right Leg - Routine Post Op      HPI: The patient is a 58 year old woman who is seen for postoperative follow-up following split-thickness skin grafting with allograft to large bilateral lower extremity ulcers on 01/07/2019.  She is back at home.  We have been utilizing Dynaflex compression wraps for edema control in both lower extremities following her grafts.  Her bilateral Dynaflex wraps have slid down and she actually cut the top of the wraps as they were sliding down and becoming constrictive.  She otherwise reports she has been trying to elevate the legs but she does have quite a bit of marked edema above the Dynaflex wraps.  Assessment & Plan: Visit Diagnoses:  1. Status post skin graft   2. Idiopathic chronic venous hypertension of both lower extremities with ulcer and inflammation ([place])   3. CKD (chronic kidney disease), stage III ([place])   4. BMI 40.0-44.9, adult [facility])     Plan: The remaining staples were removed today.  We applied Adaptic to the residual grafted site and then utilized Unna paste above the grafts in an effort to try to keep the wraps in place and then Dynaflex wraps were applied to both lower extremities.  She was instructed to call should she develop any issues with the wraps and we will see her and rewrap the legs..  She will follow-up here in 1 week.  Follow-Up Instructions: Return in about 1 week (around 02/24/2019).   Ortho Exam  Patient is alert, oriented, no adenopathy, well-dressed, normal affect, normal respiratory effort. Bilateral lower extremity allograft is incorporating well.  The residual  staples were removed this visit.  She has good beefy appearing granulation which bleeds readily with the removal of the staples.  Her edema from the mid calf below is well controlled after the Dynaflex wraps but she has a large amount of edema above where the wraps stopped.  She has palpable pedal pulses.  She has no periwound breakdown or irritation and no signs of cellulitis or infection. Imaging: No results found.    Labs: Lab Results  Component Value Date   HGBA1C 5.6 12/17/2016   HGBA1C 5.5 03/12/2014   HGBA1C 6.0 (H) 06/21/2012   REPTSTATUS 12/30/2018 FINAL 12/25/2018   GRAMSTAIN  06/27/2017    ABUNDANT WBC PRESENT,BOTH PMN AND MONONUCLEAR RARE GRAM NEGATIVE RODS RARE GRAM NEGATIVE COCCI IN PAIRS    CULT  12/25/2018    NO GROWTH 5 DAYS Performed at [facility], [place] [address], [place], [place] [address]      Lab Results  Component Value Date   ALBUMIN 3.6 (L) 02/11/2019   ALBUMIN 2.2 (L) 12/25/2018   ALBUMIN 3.4 (L) 09/11/2018    Body mass index is 41.97 kg/m.  Orders:  No orders of the defined types were placed in this encounter.  No orders of the defined types were placed in this encounter.    Procedures: No procedures performed  Clinical Data:  No additional findings.  ROS:  All other systems negative, except as noted in the HPI. Review of Systems  Objective: Vital Signs: Ht 5\' 7"  (1.702 m)   Wt 268 lb (121.6 kg)   LMP 11/21/2011   BMI 41.97 kg/m   Specialty Comments:  No specialty comments available.  PMFS History: Patient Active Problem List   Diagnosis Date Noted  . CKD (chronic kidney disease), stage III ([place]) 01/13/2019  . Infected wound 12/26/2018  . Idiopathic chronic venous hypertension of both lower extremities with ulcer and inflammation ([place])   . History of fall 05/10/2018  . Hyperkalemia 02/21/2018  . Hypotension 02/21/2018  . Sepsis ([place]) 02/21/2018  . Encounter for central line placement   . Mitral regurgitation  10/30/2017  . Acute kidney injury ([place]) 07/21/2017  . Generalized weakness 07/20/2017  . Shock circulatory ([place])   . Acute respiratory failure with hypoxia ([place])   . Pulmonary edema   . Cardiac arrest ([place]) 06/26/2017  . Ventricular fibrillation ([place])   . Acute on chronic diastolic heart failure ([place])   . Abnormal ECG   . Cellulitis 04/02/2017  . AKI (acute kidney injury) ([place]) 12/17/2016  . Cellulitis and abscess of right lower extremity 12/17/2016  . Syncope and collapse 12/17/2016  . Peripheral edema 12/17/2016  . Hypokalemia 12/17/2016  . Anemia of chronic disease 12/17/2016  . Acute diastolic CHF (congestive heart failure) ([place]) 03/14/2014  . CHF (congestive heart failure) ([place]) 03/12/2014  . Sarcoidosis ([place]) 03/12/2014  . Severe sepsis ([place]) 10/17/2013  . ARF (acute renal failure) ([place]) 10/08/2013  . Cellulitis of multiple sites of lower extremity 10/08/2013  . Morbid obesity ([place]) 10/08/2013  . Volume depletion 10/08/2013  . Venous (peripheral) insufficiency 10/28/2012  . Mediastinal lymphadenopathy 10/16/2012  . Pulmonary nodules 10/16/2012  . 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 Chest pain 09/02/2012  . Asthma   . Hypertension   . Depression   . Venous stasis ulcers ([place]) 07/29/2012  . Varicose veins of lower extremities with ulcer ([place]) 07/08/2012  . Venous ulcer of leg ([place]) 07/08/2012   Past Medical History:  Diagnosis Date  . Asthma   . CHF (congestive heart failure) ([place]) 03/12/2014   a. EF 40-45% by echo in 06/2017 with normal cors by cath. ICD placed following VT arrest  . Depression   . Headache(784.0)   . Hyperlipidemia   . Hypertension   . Lung nodules   . Renal disorder   . Sarcoidosis   . Ulcer    recurring, from chronic venous insufficiency    Family History  Problem Relation Age of Onset  . Heart failure Mother   . Diabetes Mellitus II Mother   . Hypertension Mother   . Cancer  Mother        unknown type  . Heart disease Father   . Stroke Father   . Diabetes Mellitus II Father     Past Surgical History:  Procedure Laterality Date  . cataract surgery Left 07-2013  . I&D EXTREMITY Bilateral 12/31/2018   Procedure: DEBRIDEMENT BILATERAL LEGS, APPLY VAC X 2;  Surgeon: [name], MD;  Location: [facility];  Service: Orthopedics;  Laterality: Bilateral;  . I&D EXTREMITY Bilateral 01/02/2019   Procedure: REPEAT DEBRIDEMENT BILATERAL LEGS, APPLY VAC X 2;  Surgeon: [name], MD;  Location: [facility];  Service: Orthopedics;  Laterality: Bilateral;  . ICD IMPLANT N/A 07/05/2017   Procedure: ICD Implant;  Surgeon: Constance Haw, MD;  Location: [facility];  Service: Cardiovascular;  Laterality: N/A;  . LEFT HEART CATH AND CORONARY ANGIOGRAPHY N/A 07/03/2017   Procedure: Left Heart Cath and Coronary Angiography;  Surgeon: Belva Crome, MD;  Location: [facility];  Service: Cardiovascular;  Laterality: N/A;  . SKIN SPLIT GRAFT Bilateral 01/07/2019   Procedure: SPLIT THICKNESS SKIN GRAFT BILATERAL LEGS, APPLY VAC;  Surgeon: [name], MD;  Location: [facility];  Service: Orthopedics;  Laterality: Bilateral;   Social History   Occupational History  . Not on file  Tobacco Use  . Smoking status: Never Smoker  . Smokeless tobacco: Never Used  Substance and Sexual Activity  . Alcohol use: No  . Drug use: No  . Sexual activity: Not Currently

## 2019-02-20 DIAGNOSIS — I130 Hypertensive heart and chronic kidney disease with heart failure and stage 1 through stage 4 chronic kidney disease, or unspecified chronic kidney disease: Secondary | ICD-10-CM | POA: Diagnosis not present

## 2019-02-20 DIAGNOSIS — I5040 Unspecified combined systolic (congestive) and diastolic (congestive) heart failure: Secondary | ICD-10-CM | POA: Diagnosis not present

## 2019-02-20 DIAGNOSIS — L03115 Cellulitis of right lower limb: Secondary | ICD-10-CM | POA: Diagnosis not present

## 2019-02-20 DIAGNOSIS — L03116 Cellulitis of left lower limb: Secondary | ICD-10-CM | POA: Diagnosis not present

## 2019-02-20 DIAGNOSIS — N184 Chronic kidney disease, stage 4 (severe): Secondary | ICD-10-CM | POA: Diagnosis not present

## 2019-02-20 DIAGNOSIS — I872 Venous insufficiency (chronic) (peripheral): Secondary | ICD-10-CM | POA: Diagnosis not present

## 2019-02-24 ENCOUNTER — Ambulatory Visit (INDEPENDENT_AMBULATORY_CARE_PROVIDER_SITE_OTHER): Payer: Medicare Other | Admitting: Physician Assistant

## 2019-02-24 ENCOUNTER — Encounter (INDEPENDENT_AMBULATORY_CARE_PROVIDER_SITE_OTHER): Payer: Self-pay | Admitting: Physician Assistant

## 2019-02-24 VITALS — Ht 67.0 in | Wt 268.0 lb

## 2019-02-24 DIAGNOSIS — Z6841 Body Mass Index (BMI) 40.0 and over, adult: Secondary | ICD-10-CM | POA: Diagnosis not present

## 2019-02-24 DIAGNOSIS — I87333 Chronic venous hypertension (idiopathic) with ulcer and inflammation of bilateral lower extremity: Secondary | ICD-10-CM | POA: Diagnosis not present

## 2019-02-24 DIAGNOSIS — N183 Chronic kidney disease, stage 3 unspecified: Secondary | ICD-10-CM

## 2019-02-24 DIAGNOSIS — L97919 Non-pressure chronic ulcer of unspecified part of right lower leg with unspecified severity: Secondary | ICD-10-CM

## 2019-02-24 DIAGNOSIS — L97929 Non-pressure chronic ulcer of unspecified part of left lower leg with unspecified severity: Secondary | ICD-10-CM | POA: Diagnosis not present

## 2019-02-24 DIAGNOSIS — Z945 Skin transplant status: Secondary | ICD-10-CM | POA: Diagnosis not present

## 2019-02-24 NOTE — Progress Notes (Signed)
Office Visit Note   Patient: Kari [name]           Date of Birth: [birth year]           MRN: [id number] Visit Date: 02/24/2019              Requested by: Rutherford Guys, MD [address] [place], [place] [address] PCP: Rutherford Guys, MD  Chief Complaint  Patient presents with  . Left Leg - Routine Post Op    01/07/2019 BLE debridement and STSG  . Right Leg - Routine Post Op      HPI: The patient is a 58 yo woman who is seen for post operative follow up following STSG, allograft to bilateral lower extremities on 01/07/2019. 7 weeks post op:  We have been covering the graft site with non adherent dressings and using Dynaflex multiple layer compression bilaterally.  She is pleased with her progress over the past week. Her edema is much improved and she is not having pain except intermittently.   Assessment & Plan: Visit Diagnoses:  1. Status post skin graft   2. 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 CKD (chronic kidney disease), stage III ([place])   4. BMI 40.0-44.9, adult [facility])     Plan: Will continue adaptic to the wound beds and then apply Unna paste and then Dynaflex wraps for edema control. Follow up in 1 week. May be able to begin some showering and compression socks at the next visit is edema remains well controlled. Follow up in 1 week.   Follow-Up Instructions: No follow-ups on file.   Ortho Exam  Patient is alert, oriented, no adenopathy, well-dressed, normal affect, normal respiratory effort. Bilateral lower extremity wounds continue to granulate in and the right side is showing new epithelium about the borders.  There are no signs of cellulitis or infection.  Her edema remains well controlled with the multilayer compression wraps.  She has palpable pedal pulses.      Imaging: No results found. No images are attached to the encounter.  Labs: Lab Results  Component Value Date   HGBA1C  5.6 12/17/2016   HGBA1C 5.5 03/12/2014   HGBA1C 6.0 (H) 06/21/2012   REPTSTATUS 12/30/2018 FINAL 12/25/2018   GRAMSTAIN  06/27/2017    ABUNDANT WBC PRESENT,BOTH PMN AND MONONUCLEAR RARE GRAM NEGATIVE RODS RARE GRAM NEGATIVE COCCI IN PAIRS    CULT  12/25/2018    NO GROWTH 5 DAYS Performed at [facility], [place] [address] [place], [place] [address]      Lab Results  Component Value Date   ALBUMIN 3.6 (L) 02/11/2019   ALBUMIN 2.2 (L) 12/25/2018   ALBUMIN 3.4 (L) 09/11/2018    Body mass index is 41.97 kg/m.  Orders:  No orders of the defined types were placed in this encounter.  No orders of the defined types were placed in this encounter.    Procedures: No procedures performed  Clinical Data: No additional findings.  ROS:  All other systems negative, except as noted in the HPI. Review of Systems  Objective: Vital Signs: Ht 5\' 7"  (1.702 m)   Wt 268 lb (121.6 kg)   LMP 11/21/2011   BMI 41.97 kg/m   Specialty Comments:  No specialty comments available.  PMFS History: Patient Active Problem List   Diagnosis Date Noted  . CKD (chronic kidney disease), stage III ([place]) 01/13/2019  . Infected wound 12/26/2018  . Idiopathic  chronic venous hypertension of both lower extremities with ulcer and inflammation ([place])   . History of fall 05/10/2018  . Hyperkalemia 02/21/2018  . Hypotension 02/21/2018  . Sepsis ([place]) 02/21/2018  . Encounter for central line placement   . Mitral regurgitation 10/30/2017  . Acute kidney injury ([place]) 07/21/2017  . Generalized weakness 07/20/2017  . Shock circulatory ([place])   . Acute respiratory failure with hypoxia ([place])   . Pulmonary edema   . Cardiac arrest ([place]) 06/26/2017  . Ventricular fibrillation ([place])   . Acute on chronic diastolic heart failure ([place])   . Abnormal ECG   . Cellulitis 04/02/2017  . AKI (acute kidney injury) ([place]) 12/17/2016  . Cellulitis and abscess of right lower extremity 12/17/2016  . Syncope and  collapse 12/17/2016  . Peripheral edema 12/17/2016  . Hypokalemia 12/17/2016  . Anemia of chronic disease 12/17/2016  . Acute diastolic CHF (congestive heart failure) ([place]) 03/14/2014  . CHF (congestive heart failure) ([place]) 03/12/2014  . Sarcoidosis ([place]) 03/12/2014  . Severe sepsis ([place]) 10/17/2013  . ARF (acute renal failure) ([place]) 10/08/2013  . Cellulitis of multiple sites of lower extremity 10/08/2013  . Morbid obesity ([place]) 10/08/2013  . Volume depletion 10/08/2013  . Venous (peripheral) insufficiency 10/28/2012  . Mediastinal lymphadenopathy 10/16/2012  . Pulmonary nodules 10/16/2012  . 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 Chest pain 09/02/2012  . Asthma   . Hypertension   . Depression   . Venous stasis ulcers ([place]) 07/29/2012  . Varicose veins of lower extremities with ulcer ([place]) 07/08/2012  . Venous ulcer of leg ([place]) 07/08/2012   Past Medical History:  Diagnosis Date  . Asthma   . CHF (congestive heart failure) ([place]) 03/12/2014   a. EF 40-45% by echo in 06/2017 with normal cors by cath. ICD placed following VT arrest  . Depression   . Headache(784.0)   . Hyperlipidemia   . Hypertension   . Lung nodules   . Renal disorder   . Sarcoidosis   . Ulcer    recurring, from chronic venous insufficiency    Family History  Problem Relation Age of Onset  . Heart failure Mother   . Diabetes Mellitus II Mother   . Hypertension Mother   . Cancer Mother        unknown type  . Heart disease Father   . Stroke Father   . Diabetes Mellitus II Father     Past Surgical History:  Procedure Laterality Date  . cataract surgery Left 07-2013  . I&D EXTREMITY Bilateral 12/31/2018   Procedure: DEBRIDEMENT BILATERAL LEGS, APPLY VAC X 2;  Surgeon: [name], MD;  Location: [facility];  Service: Orthopedics;  Laterality: Bilateral;  . I&D EXTREMITY Bilateral 01/02/2019   Procedure: REPEAT DEBRIDEMENT BILATERAL LEGS, APPLY VAC X 2;   Surgeon: [name], MD;  Location: [employer];  Service: Orthopedics;  Laterality: Bilateral;  . ICD IMPLANT N/A 07/05/2017   Procedure: ICD Implant;  Surgeon: Constance Haw, MD;  Location: [facility];  Service: Cardiovascular;  Laterality: N/A;  . LEFT HEART CATH AND CORONARY ANGIOGRAPHY N/A 07/03/2017   Procedure: Left Heart Cath and Coronary Angiography;  Surgeon: Belva Crome, MD;  Location: [facility];  Service: Cardiovascular;  Laterality: N/A;  . SKIN SPLIT GRAFT Bilateral 01/07/2019   Procedure: SPLIT THICKNESS SKIN GRAFT BILATERAL LEGS, APPLY VAC;  Surgeon: [name], MD;  Location: [facility];  Service: Orthopedics;  Laterality: Bilateral;  Social History   Occupational History  . Not on file  Tobacco Use  . Smoking status: Never Smoker  . Smokeless tobacco: Never Used  Substance and Sexual Activity  . Alcohol use: No  . Drug use: No  . Sexual activity: Not Currently

## 2019-02-27 ENCOUNTER — Encounter (INDEPENDENT_AMBULATORY_CARE_PROVIDER_SITE_OTHER): Payer: Self-pay | Admitting: Physician Assistant

## 2019-03-01 DIAGNOSIS — I5040 Unspecified combined systolic (congestive) and diastolic (congestive) heart failure: Secondary | ICD-10-CM | POA: Diagnosis not present

## 2019-03-01 DIAGNOSIS — L03115 Cellulitis of right lower limb: Secondary | ICD-10-CM | POA: Diagnosis not present

## 2019-03-01 DIAGNOSIS — I130 Hypertensive heart and chronic kidney disease with heart failure and stage 1 through stage 4 chronic kidney disease, or unspecified chronic kidney disease: Secondary | ICD-10-CM | POA: Diagnosis not present

## 2019-03-01 DIAGNOSIS — I872 Venous insufficiency (chronic) (peripheral): Secondary | ICD-10-CM | POA: Diagnosis not present

## 2019-03-01 DIAGNOSIS — L03116 Cellulitis of left lower limb: Secondary | ICD-10-CM | POA: Diagnosis not present

## 2019-03-01 DIAGNOSIS — N184 Chronic kidney disease, stage 4 (severe): Secondary | ICD-10-CM | POA: Diagnosis not present

## 2019-03-03 ENCOUNTER — Telehealth: Payer: Self-pay | Admitting: Family Medicine

## 2019-03-03 NOTE — Telephone Encounter (Signed)
Copied from [place] [id number]. Topic: Quick Communication - Home Health Verbal Orders >> Mar 03, 2019 10:04 AM Scherrie Gerlach wrote: Caller/Agency: Ana/ [facility] Callback Number: [phone] Requesting  extend PT Frequency: 2 wk 2

## 2019-03-03 NOTE — Telephone Encounter (Signed)
Verbal given 

## 2019-03-04 ENCOUNTER — Ambulatory Visit (INDEPENDENT_AMBULATORY_CARE_PROVIDER_SITE_OTHER): Payer: Medicare Other | Admitting: Orthopedic Surgery

## 2019-03-04 ENCOUNTER — Encounter (INDEPENDENT_AMBULATORY_CARE_PROVIDER_SITE_OTHER): Payer: Self-pay | Admitting: Family

## 2019-03-04 ENCOUNTER — Other Ambulatory Visit: Payer: Self-pay

## 2019-03-04 ENCOUNTER — Ambulatory Visit (INDEPENDENT_AMBULATORY_CARE_PROVIDER_SITE_OTHER): Payer: Medicare Other | Admitting: Family

## 2019-03-04 VITALS — Ht 67.0 in | Wt 268.0 lb

## 2019-03-04 DIAGNOSIS — I87333 Chronic venous hypertension (idiopathic) with ulcer and inflammation of bilateral lower extremity: Secondary | ICD-10-CM

## 2019-03-04 DIAGNOSIS — L97929 Non-pressure chronic ulcer of unspecified part of left lower leg with unspecified severity: Secondary | ICD-10-CM

## 2019-03-04 DIAGNOSIS — Z945 Skin transplant status: Secondary | ICD-10-CM

## 2019-03-04 DIAGNOSIS — L97919 Non-pressure chronic ulcer of unspecified part of right lower leg with unspecified severity: Secondary | ICD-10-CM

## 2019-03-04 NOTE — Progress Notes (Signed)
Office Visit Note   Patient: Kari [name]           Date of Birth: 19-Aug-1961           MRN: [id number] Visit Date: 03/04/2019              Requested by: Rutherford Guys, MD [address] [place], [place] [address] PCP: Rutherford Guys, MD  Chief Complaint  Patient presents with  . Right Leg - Routine Post Op    Bil legs SG 01/07/2019   . Left Leg - Routine Post Op      HPI: The patient is a 58 yo woman who is seen for post operative follow up following STSG, allograft to bilateral lower extremities on 01/07/2019. Draining through her [employer].   8 weeks post op:  We have been covering the graft site with non adherent dressings and using Dynaflex multiple layer compression bilaterally.  She is pleased with her progress over the past week. Her edema is much improved and she is having pain intermittently.   Assessment & Plan: Visit Diagnoses:  No diagnosis found.  Plan: Will apply silver cell to the wound beds and then apply Unna paste and then Dynaflex wraps for edema control. Follow up as scheduled on Monday.    Follow-Up Instructions: No follow-ups on file.   Ortho Exam  Patient is alert, oriented, no adenopathy, well-dressed, normal affect, normal respiratory effort. Bilateral lower extremity wounds continue to granulate, much improved granulation from last visit. Some biofilm debrided with gauze from left leg. Serous drainage today, moderate.  There are no signs of cellulitis or infection.  Her edema remains well controlled with the multilayer compression wraps.  She has palpable pedal pulses.  Imaging: No results found. No images are attached to the encounter.  Labs: Lab Results  Component Value Date   HGBA1C 5.6 12/17/2016   HGBA1C 5.5 03/12/2014   HGBA1C 6.0 (H) 06/21/2012   REPTSTATUS 12/30/2018 FINAL 12/25/2018   GRAMSTAIN  06/27/2017    ABUNDANT WBC PRESENT,BOTH PMN AND MONONUCLEAR RARE GRAM NEGATIVE RODS RARE GRAM NEGATIVE COCCI IN PAIRS    CULT   12/25/2018    NO GROWTH 5 DAYS Performed at [facility], [place] [address] [place], [place] [address]      Lab Results  Component Value Date   ALBUMIN 3.6 (L) 02/11/2019   ALBUMIN 2.2 (L) 12/25/2018   ALBUMIN 3.4 (L) 09/11/2018    Body mass index is 41.97 kg/m.  Orders:  No orders of the defined types were placed in this encounter.  No orders of the defined types were placed in this encounter.    Procedures: No procedures performed  Clinical Data: No additional findings.  ROS:  All other systems negative, except as noted in the HPI. Review of Systems  Constitutional: Negative for chills and fever.  Skin: Positive for wound. Negative for color change.    Objective: Vital Signs: Ht 5\' 7"  (1.702 m)   Wt 268 lb (121.6 kg)   LMP 11/21/2011   BMI 41.97 kg/m   Specialty Comments:  No specialty comments available.  PMFS History: Patient Active Problem List   Diagnosis Date Noted  . CKD (chronic kidney disease), stage III ([place]) 01/13/2019  . Infected wound 12/26/2018  . 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 History of fall 05/10/2018  . Hyperkalemia 02/21/2018  . Hypotension 02/21/2018  . Sepsis ([place]) 02/21/2018  .  Encounter for central line placement   . Mitral regurgitation 10/30/2017  . Acute kidney injury ([place]) 07/21/2017  . Generalized weakness 07/20/2017  . Shock circulatory ([place])   . Acute respiratory failure with hypoxia ([place])   . Pulmonary edema   . Cardiac arrest ([place]) 06/26/2017  . Ventricular fibrillation ([place])   . Acute on chronic diastolic heart failure ([place])   . Abnormal ECG   . Cellulitis 04/02/2017  . AKI (acute kidney injury) ([place]) 12/17/2016  . Cellulitis and abscess of right lower extremity 12/17/2016  . Syncope and collapse 12/17/2016  . Peripheral edema 12/17/2016  . Hypokalemia 12/17/2016  . Anemia of chronic disease 12/17/2016  . Acute diastolic CHF  (congestive heart failure) ([place]) 03/14/2014  . CHF (congestive heart failure) ([place]) 03/12/2014  . Sarcoidosis ([place]) 03/12/2014  . Severe sepsis ([place]) 10/17/2013  . ARF (acute renal failure) ([place]) 10/08/2013  . Cellulitis of multiple sites of lower extremity 10/08/2013  . Morbid obesity ([place]) 10/08/2013  . Volume depletion 10/08/2013  . Venous (peripheral) insufficiency 10/28/2012  . Mediastinal lymphadenopathy 10/16/2012  . Pulmonary nodules 10/16/2012  . 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 Chest pain 09/02/2012  . Asthma   . Hypertension   . Depression   . Venous stasis ulcers ([place]) 07/29/2012  . Varicose veins of lower extremities with ulcer ([place]) 07/08/2012  . Venous ulcer of leg ([place]) 07/08/2012   Past Medical History:  Diagnosis Date  . Asthma   . CHF (congestive heart failure) ([place]) 03/12/2014   a. EF 40-45% by echo in 06/2017 with normal cors by cath. ICD placed following VT arrest  . Depression   . Headache(784.0)   . Hyperlipidemia   . Hypertension   . Lung nodules   . Renal disorder   . Sarcoidosis   . Ulcer    recurring, from chronic venous insufficiency    Family History  Problem Relation Age of Onset  . Heart failure Mother   . Diabetes Mellitus II Mother   . Hypertension Mother   . Cancer Mother        unknown type  . Heart disease Father   . Stroke Father   . Diabetes Mellitus II Father     Past Surgical History:  Procedure Laterality Date  . cataract surgery Left 07-2013  . I&D EXTREMITY Bilateral 12/31/2018   Procedure: DEBRIDEMENT BILATERAL LEGS, APPLY VAC X 2;  Surgeon: [name], MD;  Location: [facility];  Service: Orthopedics;  Laterality: Bilateral;  . I&D EXTREMITY Bilateral 01/02/2019   Procedure: REPEAT DEBRIDEMENT BILATERAL LEGS, APPLY VAC X 2;  Surgeon: [name], MD;  Location: [facility];  Service: Orthopedics;  Laterality: Bilateral;  . ICD IMPLANT N/A 07/05/2017   Procedure: ICD  Implant;  Surgeon: Constance Haw, MD;  Location: [facility];  Service: Cardiovascular;  Laterality: N/A;  . LEFT HEART CATH AND CORONARY ANGIOGRAPHY N/A 07/03/2017   Procedure: Left Heart Cath and Coronary Angiography;  Surgeon: Belva Crome, MD;  Location: [facility];  Service: Cardiovascular;  Laterality: N/A;  . SKIN SPLIT GRAFT Bilateral 01/07/2019   Procedure: SPLIT THICKNESS SKIN GRAFT BILATERAL LEGS, APPLY VAC;  Surgeon: [name], MD;  Location: [facility];  Service: Orthopedics;  Laterality: Bilateral;   Social History   Occupational History  . Not on file  Tobacco Use  . Smoking status: Never Smoker  . Smokeless tobacco: Never Used  Substance and Sexual Activity  .  Alcohol use: No  . Drug use: No  . Sexual activity: Not Currently

## 2019-03-05 DIAGNOSIS — L03116 Cellulitis of left lower limb: Secondary | ICD-10-CM | POA: Diagnosis not present

## 2019-03-05 DIAGNOSIS — L03115 Cellulitis of right lower limb: Secondary | ICD-10-CM | POA: Diagnosis not present

## 2019-03-05 DIAGNOSIS — I872 Venous insufficiency (chronic) (peripheral): Secondary | ICD-10-CM | POA: Diagnosis not present

## 2019-03-05 DIAGNOSIS — I130 Hypertensive heart and chronic kidney disease with heart failure and stage 1 through stage 4 chronic kidney disease, or unspecified chronic kidney disease: Secondary | ICD-10-CM | POA: Diagnosis not present

## 2019-03-05 DIAGNOSIS — N184 Chronic kidney disease, stage 4 (severe): Secondary | ICD-10-CM | POA: Diagnosis not present

## 2019-03-05 DIAGNOSIS — I5040 Unspecified combined systolic (congestive) and diastolic (congestive) heart failure: Secondary | ICD-10-CM | POA: Diagnosis not present

## 2019-03-06 DIAGNOSIS — E785 Hyperlipidemia, unspecified: Secondary | ICD-10-CM | POA: Diagnosis not present

## 2019-03-06 DIAGNOSIS — N184 Chronic kidney disease, stage 4 (severe): Secondary | ICD-10-CM | POA: Diagnosis not present

## 2019-03-06 DIAGNOSIS — F329 Major depressive disorder, single episode, unspecified: Secondary | ICD-10-CM | POA: Diagnosis not present

## 2019-03-06 DIAGNOSIS — L03116 Cellulitis of left lower limb: Secondary | ICD-10-CM | POA: Diagnosis not present

## 2019-03-06 DIAGNOSIS — Z9181 History of falling: Secondary | ICD-10-CM | POA: Diagnosis not present

## 2019-03-06 DIAGNOSIS — I70209 Unspecified atherosclerosis of native arteries of extremities, unspecified extremity: Secondary | ICD-10-CM | POA: Diagnosis not present

## 2019-03-06 DIAGNOSIS — L03115 Cellulitis of right lower limb: Secondary | ICD-10-CM | POA: Diagnosis not present

## 2019-03-06 DIAGNOSIS — D509 Iron deficiency anemia, unspecified: Secondary | ICD-10-CM | POA: Diagnosis not present

## 2019-03-06 DIAGNOSIS — I255 Ischemic cardiomyopathy: Secondary | ICD-10-CM | POA: Diagnosis not present

## 2019-03-06 DIAGNOSIS — Z95810 Presence of automatic (implantable) cardiac defibrillator: Secondary | ICD-10-CM | POA: Diagnosis not present

## 2019-03-06 DIAGNOSIS — I872 Venous insufficiency (chronic) (peripheral): Secondary | ICD-10-CM | POA: Diagnosis not present

## 2019-03-06 DIAGNOSIS — I130 Hypertensive heart and chronic kidney disease with heart failure and stage 1 through stage 4 chronic kidney disease, or unspecified chronic kidney disease: Secondary | ICD-10-CM | POA: Diagnosis not present

## 2019-03-06 DIAGNOSIS — Z6836 Body mass index (BMI) 36.0-36.9, adult: Secondary | ICD-10-CM | POA: Diagnosis not present

## 2019-03-06 DIAGNOSIS — R41841 Cognitive communication deficit: Secondary | ICD-10-CM | POA: Diagnosis not present

## 2019-03-06 DIAGNOSIS — Z7982 Long term (current) use of aspirin: Secondary | ICD-10-CM | POA: Diagnosis not present

## 2019-03-06 DIAGNOSIS — I4901 Ventricular fibrillation: Secondary | ICD-10-CM | POA: Diagnosis not present

## 2019-03-06 DIAGNOSIS — I5040 Unspecified combined systolic (congestive) and diastolic (congestive) heart failure: Secondary | ICD-10-CM | POA: Diagnosis not present

## 2019-03-06 DIAGNOSIS — J45909 Unspecified asthma, uncomplicated: Secondary | ICD-10-CM | POA: Diagnosis not present

## 2019-03-06 DIAGNOSIS — E559 Vitamin D deficiency, unspecified: Secondary | ICD-10-CM | POA: Diagnosis not present

## 2019-03-06 DIAGNOSIS — I051 Rheumatic mitral insufficiency: Secondary | ICD-10-CM | POA: Diagnosis not present

## 2019-03-06 DIAGNOSIS — M10079 Idiopathic gout, unspecified ankle and foot: Secondary | ICD-10-CM | POA: Diagnosis not present

## 2019-03-06 DIAGNOSIS — D631 Anemia in chronic kidney disease: Secondary | ICD-10-CM | POA: Diagnosis not present

## 2019-03-06 DIAGNOSIS — E669 Obesity, unspecified: Secondary | ICD-10-CM | POA: Diagnosis not present

## 2019-03-09 IMAGING — CT CT HEAD W/O CM
3 series · 15 of 47 positions shown, 18 images · non-contrast
Comparison: 08/03/2006.

CLINICAL DATA: Cardiac arrest.

EXAM:
CT HEAD WITHOUT CONTRAST
TECHNIQUE: Contiguous axial images were obtained from the base of the skull
through the vertex without intravenous contrast.

[Series 2: head wo · axial · 0.45mm/px · z∈[+54,+189]mm · 9 of 33 slices shown, 12 images]
[im 3/33  brain]
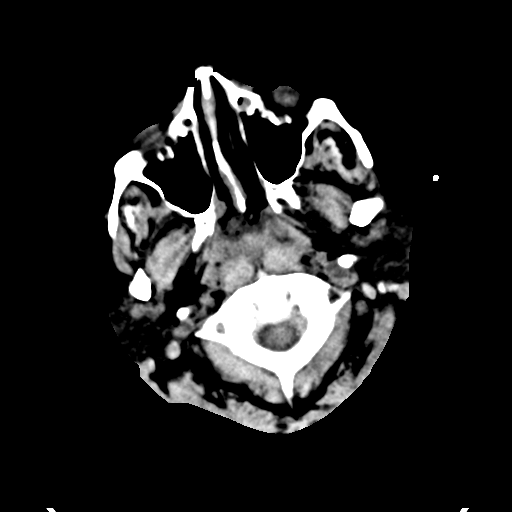
[im 3/33  bone]
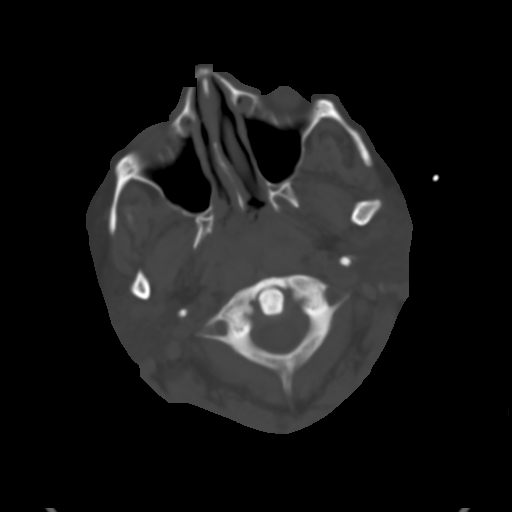
[im 6/33  brain]
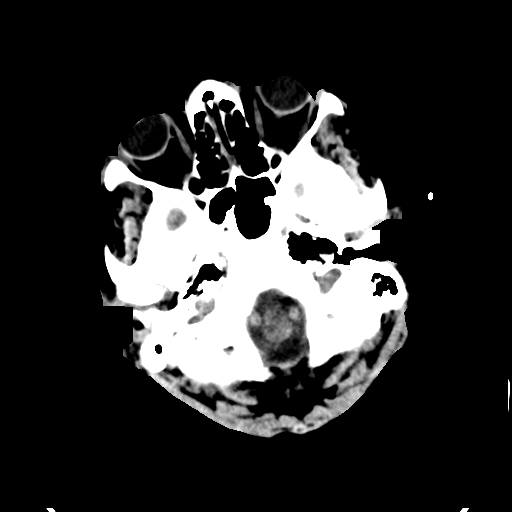
[im 9/33  brain]
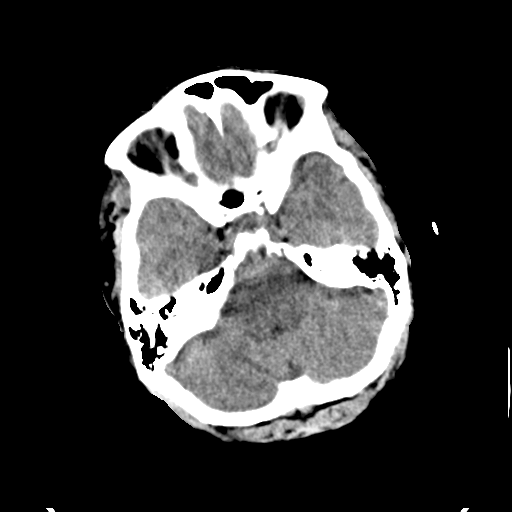
[im 13/33  brain]
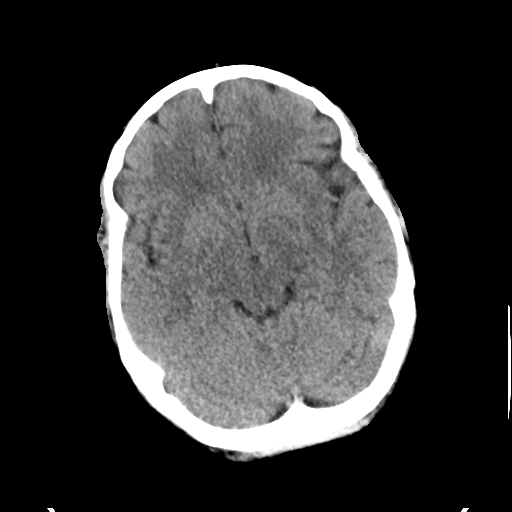
[im 17/33  brain]
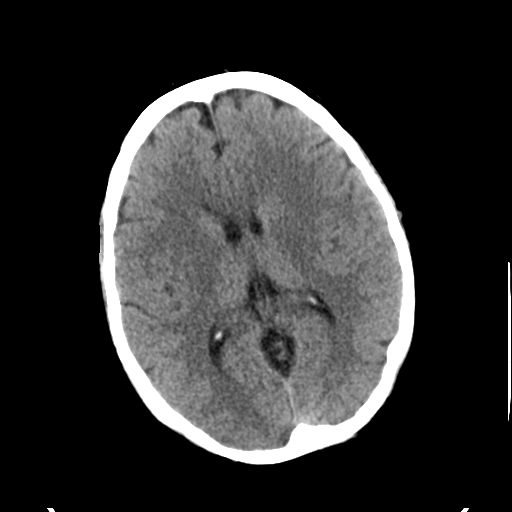
[im 17/33  bone]
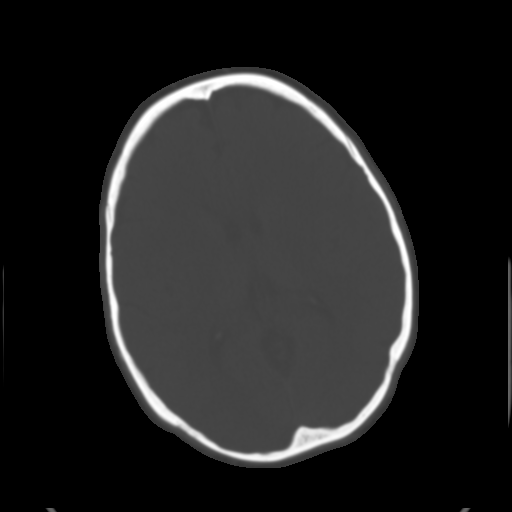
[im 20/33  brain]
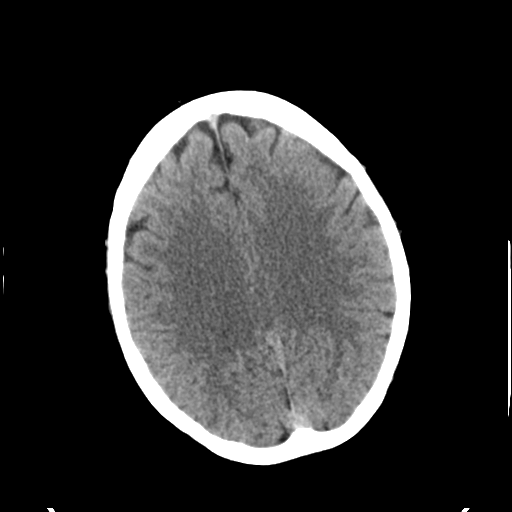
[im 24/33  brain]
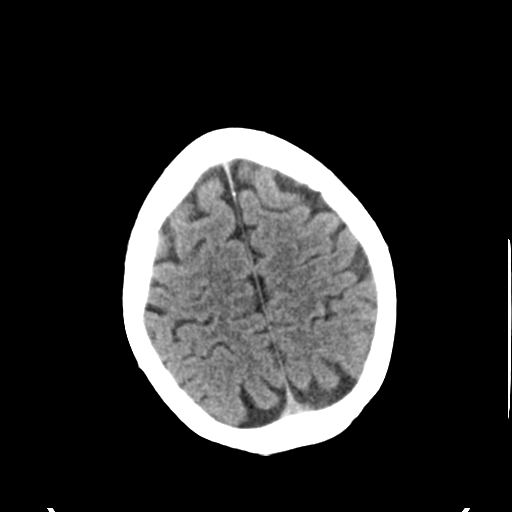
[im 27/33  brain]
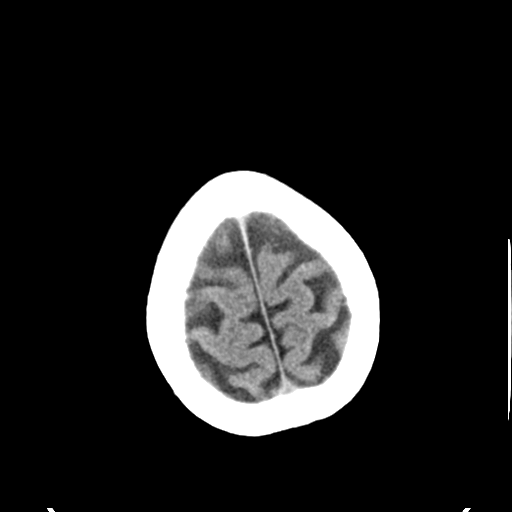
[im 30/33  brain]
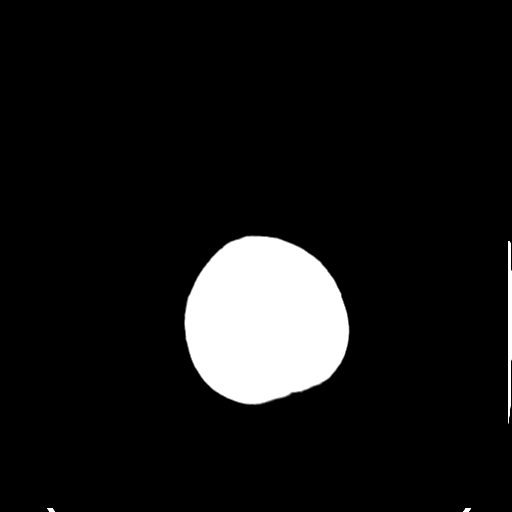
[im 30/33  bone]
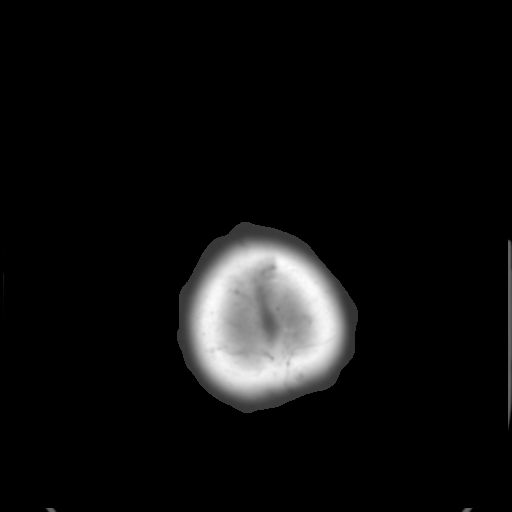

[Series 4: coronal soft tissue · coronal · 0.31mm/px · 3 of 69 slices shown]
[im 23/69  brain]
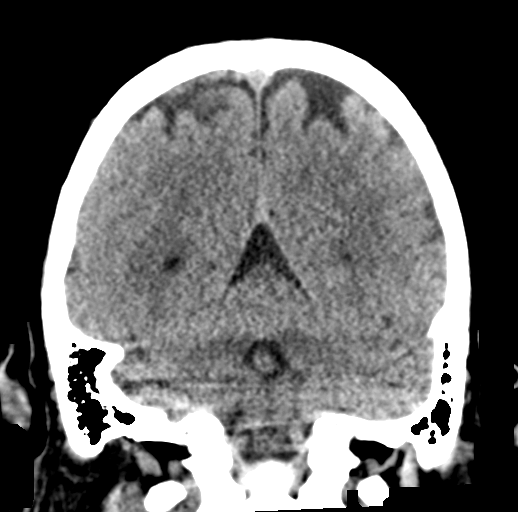
[im 31/69  brain]
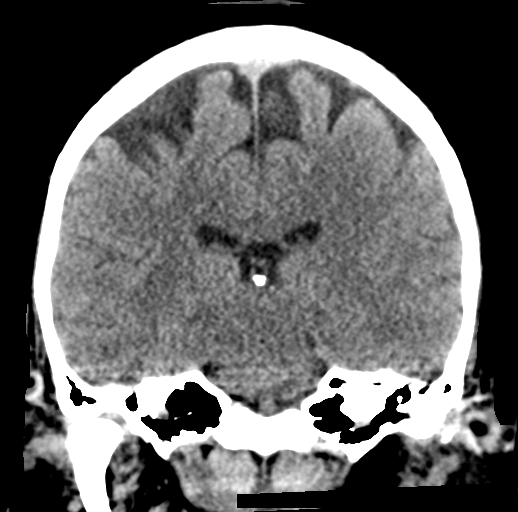
[im 38/69  brain]
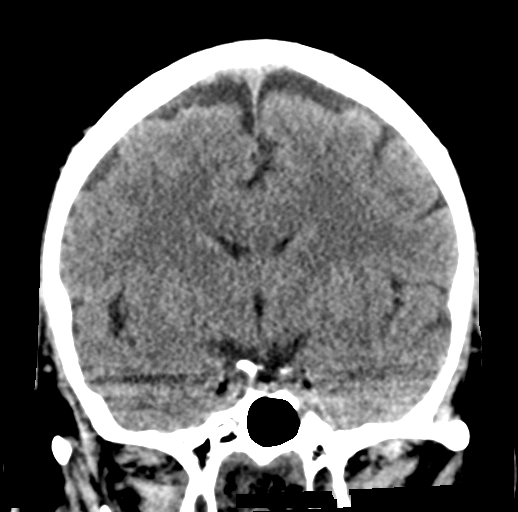

[Series 5: sagittal soft tissue · sagittal · 0.31mm/px · 3 of 53 slices shown]
[im 18/53  brain]
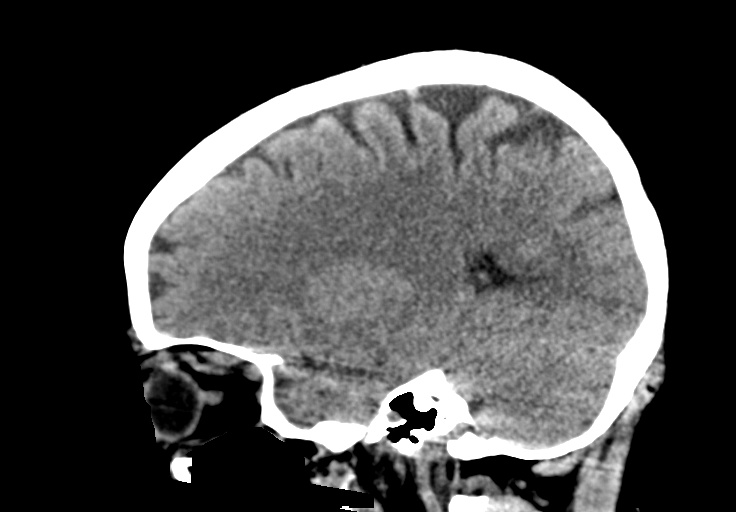
[im 27/53  brain]
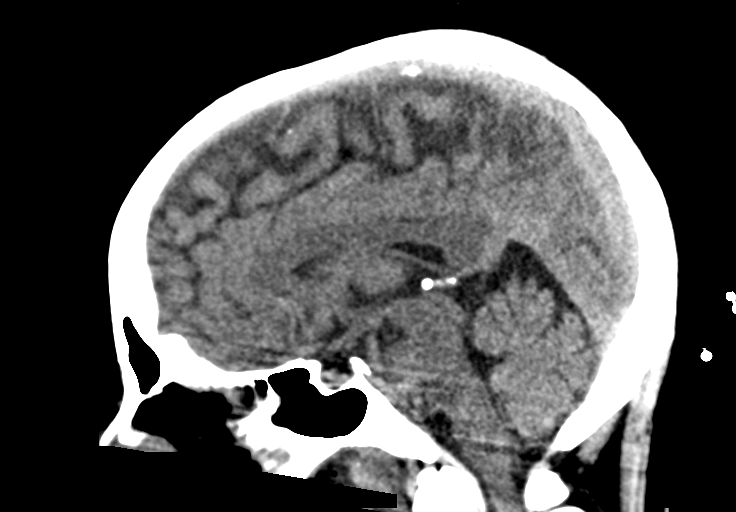
[im 35/53  brain]
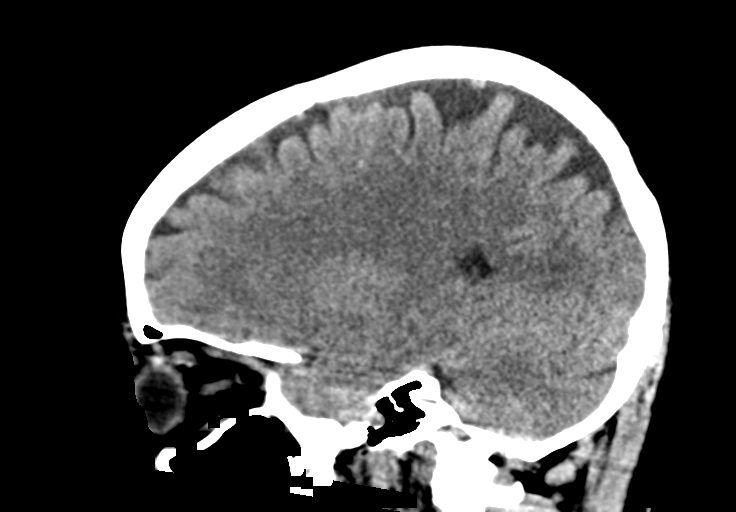

[15 of 47 positions shown; findings below may reference images not displayed]

FINDINGS: Brain: There is no evidence for acute hemorrhage, hydrocephalus,
mass lesion, or abnormal extra-axial fluid collection. No definite
CT evidence for acute infarction.

Vascular: No hyperdense vessel or unexpected calcification.

Skull: No evidence for fracture. No worrisome lytic or sclerotic
lesion.

Sinuses/Orbits: The visualized paranasal sinuses and mastoid air
cells are clear. Visualized portions of the globes and intraorbital
fat are unremarkable.

Other: None.
IMPRESSION: Stable.  No acute intracranial abnormality.

## 2019-03-10 ENCOUNTER — Encounter (INDEPENDENT_AMBULATORY_CARE_PROVIDER_SITE_OTHER): Payer: Self-pay | Admitting: Orthopedic Surgery

## 2019-03-10 ENCOUNTER — Ambulatory Visit (INDEPENDENT_AMBULATORY_CARE_PROVIDER_SITE_OTHER): Payer: Medicare Other | Admitting: *Deleted

## 2019-03-10 ENCOUNTER — Ambulatory Visit (INDEPENDENT_AMBULATORY_CARE_PROVIDER_SITE_OTHER): Payer: Medicare Other | Admitting: Orthopedic Surgery

## 2019-03-10 ENCOUNTER — Other Ambulatory Visit: Payer: Self-pay

## 2019-03-10 ENCOUNTER — Telehealth: Payer: Self-pay | Admitting: Family Medicine

## 2019-03-10 DIAGNOSIS — I428 Other cardiomyopathies: Secondary | ICD-10-CM | POA: Diagnosis not present

## 2019-03-10 DIAGNOSIS — L03115 Cellulitis of right lower limb: Secondary | ICD-10-CM | POA: Diagnosis not present

## 2019-03-10 DIAGNOSIS — N184 Chronic kidney disease, stage 4 (severe): Secondary | ICD-10-CM | POA: Diagnosis not present

## 2019-03-10 DIAGNOSIS — I87333 Chronic venous hypertension (idiopathic) with ulcer and inflammation of bilateral lower extremity: Secondary | ICD-10-CM | POA: Diagnosis not present

## 2019-03-10 DIAGNOSIS — I5040 Unspecified combined systolic (congestive) and diastolic (congestive) heart failure: Secondary | ICD-10-CM | POA: Diagnosis not present

## 2019-03-10 DIAGNOSIS — I872 Venous insufficiency (chronic) (peripheral): Secondary | ICD-10-CM | POA: Diagnosis not present

## 2019-03-10 DIAGNOSIS — L97919 Non-pressure chronic ulcer of unspecified part of right lower leg with unspecified severity: Secondary | ICD-10-CM

## 2019-03-10 DIAGNOSIS — L03116 Cellulitis of left lower limb: Secondary | ICD-10-CM | POA: Diagnosis not present

## 2019-03-10 DIAGNOSIS — I130 Hypertensive heart and chronic kidney disease with heart failure and stage 1 through stage 4 chronic kidney disease, or unspecified chronic kidney disease: Secondary | ICD-10-CM | POA: Diagnosis not present

## 2019-03-10 DIAGNOSIS — Z945 Skin transplant status: Secondary | ICD-10-CM | POA: Diagnosis not present

## 2019-03-10 DIAGNOSIS — L97929 Non-pressure chronic ulcer of unspecified part of left lower leg with unspecified severity: Secondary | ICD-10-CM

## 2019-03-10 NOTE — Telephone Encounter (Signed)
Copied from [place] [id number]. Topic: Quick Communication - See Telephone Encounter >> Mar 10, 2019  2:36 PM Valla Leaver wrote: CRM for notification. See Telephone encounter for: 03/10/19. Patient wants to know if Dr. [name] prescribes boost or ensure because she was told by Dr. Laney Pastor (ortho) that's she needs it for protein after her surgery?

## 2019-03-10 NOTE — Telephone Encounter (Signed)
Spoke with patient and said she's on a fixed income and needs it prescribe for insurance to pay. Spoke with Wilfred Curtis and she said her surgeon would have to prescribe that. Called patient back and left VM to call back.

## 2019-03-10 NOTE — Progress Notes (Signed)
Office Visit Note   Patient: Kari [name]           Date of Birth: Aug 01, 1961           MRN: [id number] Visit Date: 03/10/2019              Requested by: Rutherford Guys, MD [address] [place], [place] [address] PCP: Rutherford Guys, MD  Chief Complaint  Patient presents with  . Right Leg - Wound Check  . Left Leg - Wound Check      HPI: Patient is a 58 year old woman who presents follow-up status post bilateral lower extremity split-thickness skin graft for venous insufficiency ulcers.  Patient states that she is walking more she has no pain she states she has decreased swelling she denies any odor she states that physical therapy is doing well and she feels like she is making excellent progress.  Assessment & Plan: Visit Diagnoses:  1. 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 S/P split thickness skin graft     Plan: We will apply silver cell to the ulcers on both legs plus a Dynaflex wrap.  Follow-up in 1 week.  Discussed that we will change her to compression stockings soon.  Recommended protein powder nutrition supplements.  Follow-Up Instructions: Return in about 1 week (around 03/17/2019).   Ortho Exam  Patient is alert, oriented, no adenopathy, well-dressed, normal affect, normal respiratory effort. Examination patient has excellent epithelialization for the ulcers on both legs the wounds are approximately 50% healed the remaining wound has good granulation tissue no odor no drainage no cellulitis no signs of infection.  Imaging: No results found. No images are attached to the encounter.  Labs: Lab Results  Component Value Date   HGBA1C 5.6 12/17/2016   HGBA1C 5.5 03/12/2014   HGBA1C 6.0 (H) 06/21/2012   REPTSTATUS 12/30/2018 FINAL 12/25/2018   GRAMSTAIN  06/27/2017    ABUNDANT WBC PRESENT,BOTH PMN AND MONONUCLEAR RARE GRAM NEGATIVE RODS RARE GRAM NEGATIVE COCCI IN PAIRS    CULT   12/25/2018    NO GROWTH 5 DAYS Performed at [facility], [place] [address], [place], [place] [address]      Lab Results  Component Value Date   ALBUMIN 3.6 (L) 02/11/2019   ALBUMIN 2.2 (L) 12/25/2018   ALBUMIN 3.4 (L) 09/11/2018    There is no height or weight on file to calculate BMI.  Orders:  No orders of the defined types were placed in this encounter.  No orders of the defined types were placed in this encounter.    Procedures: No procedures performed  Clinical Data: No additional findings.  ROS:  All other systems negative, except as noted in the HPI. Review of Systems  Objective: Vital Signs: LMP 11/21/2011   Specialty Comments:  No specialty comments available.  PMFS History: Patient Active Problem List   Diagnosis Date Noted  . CKD (chronic kidney disease), stage III ([place]) 01/13/2019  . Infected wound 12/26/2018  . Idiopathic chronic venous hypertension of both lower extremities with ulcer and inflammation ([place])   . History of fall 05/10/2018  . Hyperkalemia 02/21/2018  . Hypotension 02/21/2018  . Sepsis ([place]) 02/21/2018  . Encounter for central line placement   . Mitral regurgitation 10/30/2017  . Acute kidney injury ([place]) 07/21/2017  . Generalized weakness 07/20/2017  . Shock circulatory ([place])   . Acute respiratory failure with hypoxia ([place])   . Pulmonary edema   .  Cardiac arrest ([place]) 06/26/2017  . Ventricular fibrillation ([place])   . Acute on chronic diastolic heart failure ([place])   . Abnormal ECG   . Cellulitis 04/02/2017  . AKI (acute kidney injury) ([place]) 12/17/2016  . Cellulitis and abscess of right lower extremity 12/17/2016  . Syncope and collapse 12/17/2016  . Peripheral edema 12/17/2016  . Hypokalemia 12/17/2016  . Anemia of chronic disease 12/17/2016  . Acute diastolic CHF (congestive heart failure) ([place]) 03/14/2014  . CHF (congestive heart failure) ([place]) 03/12/2014  . Sarcoidosis ([place]) 03/12/2014  . Severe sepsis ([place])  10/17/2013  . ARF (acute renal failure) ([place]) 10/08/2013  . Cellulitis of multiple sites of lower extremity 10/08/2013  . Morbid obesity ([place]) 10/08/2013  . Volume depletion 10/08/2013  . Venous (peripheral) insufficiency 10/28/2012  . Mediastinal lymphadenopathy 10/16/2012  . Pulmonary nodules 10/16/2012  . 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 Chest pain 09/02/2012  . Asthma   . Hypertension   . Depression   . Venous stasis ulcers ([place]) 07/29/2012  . Varicose veins of lower extremities with ulcer ([place]) 07/08/2012  . Venous ulcer of leg ([place]) 07/08/2012   Past Medical History:  Diagnosis Date  . Asthma   . CHF (congestive heart failure) ([place]) 03/12/2014   a. EF 40-45% by echo in 06/2017 with normal cors by cath. ICD placed following VT arrest  . Depression   . Headache(784.0)   . Hyperlipidemia   . Hypertension   . Lung nodules   . Renal disorder   . Sarcoidosis   . Ulcer    recurring, from chronic venous insufficiency    Family History  Problem Relation Age of Onset  . Heart failure Mother   . Diabetes Mellitus II Mother   . Hypertension Mother   . Cancer Mother        unknown type  . Heart disease Father   . Stroke Father   . Diabetes Mellitus II Father     Past Surgical History:  Procedure Laterality Date  . cataract surgery Left 07-2013  . I&D EXTREMITY Bilateral 12/31/2018   Procedure: DEBRIDEMENT BILATERAL LEGS, APPLY VAC X 2;  Surgeon: [name], MD;  Location: [facility];  Service: Orthopedics;  Laterality: Bilateral;  . I&D EXTREMITY Bilateral 01/02/2019   Procedure: REPEAT DEBRIDEMENT BILATERAL LEGS, APPLY VAC X 2;  Surgeon: [name], MD;  Location: [facility];  Service: Orthopedics;  Laterality: Bilateral;  . ICD IMPLANT N/A 07/05/2017   Procedure: ICD Implant;  Surgeon: Constance Haw, MD;  Location: [facility];  Service: Cardiovascular;  Laterality: N/A;  . LEFT HEART CATH AND CORONARY  ANGIOGRAPHY N/A 07/03/2017   Procedure: Left Heart Cath and Coronary Angiography;  Surgeon: Belva Crome, MD;  Location: [facility];  Service: Cardiovascular;  Laterality: N/A;  . SKIN SPLIT GRAFT Bilateral 01/07/2019   Procedure: SPLIT THICKNESS SKIN GRAFT BILATERAL LEGS, APPLY VAC;  Surgeon: [name], MD;  Location: [facility];  Service: Orthopedics;  Laterality: Bilateral;   Social History   Occupational History  . Not on file  Tobacco Use  . Smoking status: Never Smoker  . Smokeless tobacco: Never Used  Substance and Sexual Activity  . Alcohol use: No  . Drug use: No  . Sexual activity: Not Currently

## 2019-03-11 ENCOUNTER — Telehealth: Payer: Self-pay

## 2019-03-11 LAB — CUP PACEART REMOTE DEVICE CHECK
Battery Remaining Longevity: 75 mo
Battery Remaining Percentage: 73 %
Battery Voltage: 2.98 V
Brady Statistic RV Percent Paced: 1 %
Date Time Interrogation Session: 20200318134800
HighPow Impedance: 48 Ohm
HighPow Impedance: 48 Ohm
Implantable Lead Implant Date: 20180713
Implantable Lead Location: 753860
Implantable Pulse Generator Implant Date: 20180713
Lead Channel Impedance Value: 340 Ohm
Lead Channel Pacing Threshold Amplitude: 0.5 V
Lead Channel Pacing Threshold Pulse Width: 0.5 ms
Lead Channel Sensing Intrinsic Amplitude: 4.8 mV
Lead Channel Setting Pacing Amplitude: 2.5 V
Lead Channel Setting Pacing Pulse Width: 0.5 ms
Lead Channel Setting Sensing Sensitivity: 0.5 mV
Pulse Gen Serial Number: 7302172

## 2019-03-11 NOTE — Telephone Encounter (Signed)
Spoke with patient to remind of missed remote transmission 

## 2019-03-13 DIAGNOSIS — N184 Chronic kidney disease, stage 4 (severe): Secondary | ICD-10-CM | POA: Diagnosis not present

## 2019-03-13 DIAGNOSIS — I5040 Unspecified combined systolic (congestive) and diastolic (congestive) heart failure: Secondary | ICD-10-CM | POA: Diagnosis not present

## 2019-03-13 DIAGNOSIS — L03115 Cellulitis of right lower limb: Secondary | ICD-10-CM | POA: Diagnosis not present

## 2019-03-13 DIAGNOSIS — I872 Venous insufficiency (chronic) (peripheral): Secondary | ICD-10-CM | POA: Diagnosis not present

## 2019-03-13 DIAGNOSIS — I130 Hypertensive heart and chronic kidney disease with heart failure and stage 1 through stage 4 chronic kidney disease, or unspecified chronic kidney disease: Secondary | ICD-10-CM | POA: Diagnosis not present

## 2019-03-13 DIAGNOSIS — L03116 Cellulitis of left lower limb: Secondary | ICD-10-CM | POA: Diagnosis not present

## 2019-03-16 ENCOUNTER — Telehealth (INDEPENDENT_AMBULATORY_CARE_PROVIDER_SITE_OTHER): Payer: Self-pay | Admitting: Radiology

## 2019-03-16 NOTE — Telephone Encounter (Signed)
I have called patient and asked pre-screening questions, will keep appointment.  Do you have now or have you had in the past 7 days a fever and/or chills? NO Do you have now or have you had in the past 7 days a cough? NO Do you have now or have you had in the last 7 days nausea, vomiting or abdominal pain? NO Have you been exposed to anyone who has tested positive for COVID-19? NO Have you or anyone who lives with you traveled within the last month? NO

## 2019-03-17 ENCOUNTER — Ambulatory Visit (INDEPENDENT_AMBULATORY_CARE_PROVIDER_SITE_OTHER): Payer: Medicare Other | Admitting: Physician Assistant

## 2019-03-17 ENCOUNTER — Encounter (INDEPENDENT_AMBULATORY_CARE_PROVIDER_SITE_OTHER): Payer: Self-pay | Admitting: Physician Assistant

## 2019-03-17 ENCOUNTER — Other Ambulatory Visit: Payer: Self-pay

## 2019-03-17 VITALS — Ht 67.0 in | Wt 268.0 lb

## 2019-03-17 DIAGNOSIS — I87333 Chronic venous hypertension (idiopathic) with ulcer and inflammation of bilateral lower extremity: Secondary | ICD-10-CM | POA: Diagnosis not present

## 2019-03-17 DIAGNOSIS — B351 Tinea unguium: Secondary | ICD-10-CM | POA: Diagnosis not present

## 2019-03-17 DIAGNOSIS — L97919 Non-pressure chronic ulcer of unspecified part of right lower leg with unspecified severity: Secondary | ICD-10-CM | POA: Diagnosis not present

## 2019-03-17 DIAGNOSIS — Z945 Skin transplant status: Secondary | ICD-10-CM

## 2019-03-17 DIAGNOSIS — L97929 Non-pressure chronic ulcer of unspecified part of left lower leg with unspecified severity: Secondary | ICD-10-CM | POA: Diagnosis not present

## 2019-03-17 DIAGNOSIS — N183 Chronic kidney disease, stage 3 unspecified: Secondary | ICD-10-CM

## 2019-03-17 NOTE — Progress Notes (Signed)
Office Visit Note   Patient: Kari [name]           Date of Birth: 1961/11/21           MRN: [id number] Visit Date: 03/17/2019              Requested by: Rutherford Guys, MD [address] [place], [place] [address] PCP: Rutherford Guys, MD  Chief Complaint  Patient presents with  . Left Leg - Routine Post Op  . Right Leg - Routine Post Op      HPI: The patient is a 58 year old woman who is seen for postoperative follow-up following split thickness skin graft, allograft to bilateral lower extremities on 01/07/2019.  She is about 10 weeks postop and is epithelializing about the borders with marked improvement in her residual wound beds and marked improvement in her edema with use of Dynaflex multilayer compression wraps.  She is very pleased with her progress.  She has severe onychomycotic toenails and normally went to the podiatrist to have them trimmed but has been unable to get in and see the podiatrist over the past 3 months.  Assessment & Plan: Visit Diagnoses:  1. S/P split thickness skin graft   2. Idiopathic chronic venous hypertension of both lower extremities with ulcer and inflammation ([place])   3. CKD (chronic kidney disease), stage III (HCC)     Plan: Onychomycotic nails were trimmed x10 as the patient is unable to trim these safely on her own.  Patient was instructed to begin Vive silver compression socks to wear over the residual open areas around the clock except for showering. She was instructed to elevate and off load her legs as much as possible. She is going to follow up in 1 week.   Follow-Up Instructions: Return in about 1 week (around 03/24/2019).   Ortho Exam  Patient is alert, oriented, no adenopathy, well-dressed, normal affect, normal respiratory effort. Bilateral lower leg grafts have incorporated very well and there is excellent epithelialization around the borders. There remains very superficial open areas in the central areas. Good pedal pulses and  edema remains markedly improved. No signs of infection or cellulitis. onychomycotic nails x 10 were trimmed as the patient is unable to trim these safely.   Imaging: No results found.    Labs: Lab Results  Component Value Date   HGBA1C 5.6 12/17/2016   HGBA1C 5.5 03/12/2014   HGBA1C 6.0 (H) 06/21/2012   REPTSTATUS 12/30/2018 FINAL 12/25/2018   GRAMSTAIN  06/27/2017    ABUNDANT WBC PRESENT,BOTH PMN AND MONONUCLEAR RARE GRAM NEGATIVE RODS RARE GRAM NEGATIVE COCCI IN PAIRS    CULT  12/25/2018    NO GROWTH 5 DAYS Performed at [facility], [place] [address] [place], [place] [address]      Lab Results  Component Value Date   ALBUMIN 3.6 (L) 02/11/2019   ALBUMIN 2.2 (L) 12/25/2018   ALBUMIN 3.4 (L) 09/11/2018    Body mass index is 41.97 kg/m.  Orders:  No orders of the defined types were placed in this encounter.  No orders of the defined types were placed in this encounter.    Procedures: No procedures performed  Clinical Data: No additional findings.  ROS:  All other systems negative, except as noted in the HPI. Review of Systems  Objective: Vital Signs: Ht 5\' 7"  (1.702 m)   Wt 268 lb (121.6 kg)   LMP 11/21/2011   BMI 41.97 kg/m   Specialty Comments:  No specialty  comments available.  PMFS History: Patient Active Problem List   Diagnosis Date Noted  . CKD (chronic kidney disease), stage III ([place]) 01/13/2019  . Infected wound 12/26/2018  . Idiopathic chronic venous hypertension of both lower extremities with ulcer and inflammation ([place])   . History of fall 05/10/2018  . Hyperkalemia 02/21/2018  . Hypotension 02/21/2018  . Sepsis ([place]) 02/21/2018  . Encounter for central line placement   . Mitral regurgitation 10/30/2017  . Acute kidney injury ([place]) 07/21/2017  . Generalized weakness 07/20/2017  . Shock circulatory ([place])   . Acute respiratory failure with hypoxia ([place])   . Pulmonary edema   . Cardiac arrest ([place]) 06/26/2017  . Ventricular  fibrillation ([place])   . Acute on chronic diastolic heart failure ([place])   . Abnormal ECG   . Cellulitis 04/02/2017  . AKI (acute kidney injury) ([place]) 12/17/2016  . Cellulitis and abscess of right lower extremity 12/17/2016  . Syncope and collapse 12/17/2016  . Peripheral edema 12/17/2016  . Hypokalemia 12/17/2016  . Anemia of chronic disease 12/17/2016  . Acute diastolic CHF (congestive heart failure) ([place]) 03/14/2014  . CHF (congestive heart failure) ([place]) 03/12/2014  . Sarcoidosis ([place]) 03/12/2014  . Severe sepsis ([place]) 10/17/2013  . ARF (acute renal failure) ([place]) 10/08/2013  . Cellulitis of multiple sites of lower extremity 10/08/2013  . Morbid obesity ([place]) 10/08/2013  . Volume depletion 10/08/2013  . Venous (peripheral) insufficiency 10/28/2012  . Mediastinal lymphadenopathy 10/16/2012  . Pulmonary nodules 10/16/2012  . 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 Chest pain 09/02/2012  . Asthma   . Hypertension   . Depression   . Venous stasis ulcers ([place]) 07/29/2012  . Varicose veins of lower extremities with ulcer ([place]) 07/08/2012  . Venous ulcer of leg ([place]) 07/08/2012   Past Medical History:  Diagnosis Date  . Asthma   . CHF (congestive heart failure) ([place]) 03/12/2014   a. EF 40-45% by echo in 06/2017 with normal cors by cath. ICD placed following VT arrest  . Depression   . Headache(784.0)   . Hyperlipidemia   . Hypertension   . Lung nodules   . Renal disorder   . Sarcoidosis   . Ulcer    recurring, from chronic venous insufficiency    Family History  Problem Relation Age of Onset  . Heart failure Mother   . Diabetes Mellitus II Mother   . Hypertension Mother   . Cancer Mother        unknown type  . Heart disease Father   . Stroke Father   . Diabetes Mellitus II Father     Past Surgical History:  Procedure Laterality Date  . cataract surgery Left 07-2013  . I&D EXTREMITY Bilateral 12/31/2018    Procedure: DEBRIDEMENT BILATERAL LEGS, APPLY VAC X 2;  Surgeon: [name], MD;  Location: [facility];  Service: Orthopedics;  Laterality: Bilateral;  . I&D EXTREMITY Bilateral 01/02/2019   Procedure: REPEAT DEBRIDEMENT BILATERAL LEGS, APPLY VAC X 2;  Surgeon: [name], MD;  Location: [facility];  Service: Orthopedics;  Laterality: Bilateral;  . ICD IMPLANT N/A 07/05/2017   Procedure: ICD Implant;  Surgeon: Constance Haw, MD;  Location: [facility];  Service: Cardiovascular;  Laterality: N/A;  . LEFT HEART CATH AND CORONARY ANGIOGRAPHY N/A 07/03/2017   Procedure: Left Heart Cath and Coronary Angiography;  Surgeon: Belva Crome, MD;  Location: [facility];  Service: Cardiovascular;  Laterality: N/A;  .  SKIN SPLIT GRAFT Bilateral 01/07/2019   Procedure: SPLIT THICKNESS SKIN GRAFT BILATERAL LEGS, APPLY VAC;  Surgeon: [name], MD;  Location: [facility];  Service: Orthopedics;  Laterality: Bilateral;   Social History   Occupational History  . Not on file  Tobacco Use  . Smoking status: Never Smoker  . Smokeless tobacco: Never Used  Substance and Sexual Activity  . Alcohol use: No  . Drug use: No  . Sexual activity: Not Currently

## 2019-03-18 ENCOUNTER — Encounter: Payer: Self-pay | Admitting: Cardiology

## 2019-03-18 NOTE — Progress Notes (Signed)
Remote ICD transmission.   

## 2019-03-23 ENCOUNTER — Telehealth (INDEPENDENT_AMBULATORY_CARE_PROVIDER_SITE_OTHER): Payer: Self-pay | Admitting: *Deleted

## 2019-03-23 NOTE — Telephone Encounter (Signed)
Called pt and they answered no to all covid-19 pre screening questions.  

## 2019-03-24 ENCOUNTER — Other Ambulatory Visit: Payer: Self-pay

## 2019-03-24 ENCOUNTER — Encounter (INDEPENDENT_AMBULATORY_CARE_PROVIDER_SITE_OTHER): Payer: Self-pay | Admitting: Orthopedic Surgery

## 2019-03-24 ENCOUNTER — Ambulatory Visit (INDEPENDENT_AMBULATORY_CARE_PROVIDER_SITE_OTHER): Payer: Medicare Other | Admitting: Orthopedic Surgery

## 2019-03-24 VITALS — Ht 67.0 in | Wt 268.0 lb

## 2019-03-24 DIAGNOSIS — Z6841 Body Mass Index (BMI) 40.0 and over, adult: Secondary | ICD-10-CM

## 2019-03-24 DIAGNOSIS — L97919 Non-pressure chronic ulcer of unspecified part of right lower leg with unspecified severity: Secondary | ICD-10-CM

## 2019-03-24 DIAGNOSIS — Z945 Skin transplant status: Secondary | ICD-10-CM

## 2019-03-24 DIAGNOSIS — I87333 Chronic venous hypertension (idiopathic) with ulcer and inflammation of bilateral lower extremity: Secondary | ICD-10-CM

## 2019-03-24 DIAGNOSIS — L97929 Non-pressure chronic ulcer of unspecified part of left lower leg with unspecified severity: Secondary | ICD-10-CM

## 2019-03-24 NOTE — Progress Notes (Signed)
Office Visit Note   Patient: Kari [name]           Date of Birth: 1961-05-25           MRN: [id number] Visit Date: 03/24/2019              Requested by: Rutherford Guys, MD [address] [place], [place] [address] PCP: Rutherford Guys, MD  Chief Complaint  Patient presents with  . Left Leg - Follow-up  . Right Leg - Follow-up      HPI: Patient is a 58 year old woman status post split-thickness skin graft to both lower extremities.  Compression wraps.patient was discharged previously with her to start using compression socks.  Patient states she was unable to apply the socks and presents in follow-up for both legs she states the legs look better today than they did last visit she states she does have some bleeding from the wound bed.   Assessment & Plan: Visit Diagnoses:  1. S/P split thickness skin graft   2. Idiopathic chronic venous hypertension of both lower extremities with ulcer and inflammation ([place])   3. BMI 40.0-44.9, adult ([place])   4. Morbid obesity ([place])     Plan: We will apply the [facility] that she has a package of we will apply Dynaflex wrap to both lower extremities follow-up in 1 week to rewrap her legs.  She will bring her compression socks with her to see if we can start using the compression socks.  Follow-Up Instructions: Return in about 1 week (around 03/31/2019).   Ortho Exam  Patient is alert, oriented, no adenopathy, well-dressed, normal affect, normal respiratory effort. Examination patient still has swelling in both lower extremities the wound bed on both legs has 100% healthy beefy granulation tissue no cellulitis no odor no drainage no signs of infection.  Patient has excellent epithelialization approximately two thirds of the wounds have completely healed.  Imaging: No results found. No images are attached to the encounter.  Labs: Lab Results  Component Value Date   HGBA1C 5.6 12/17/2016   HGBA1C 5.5 03/12/2014   HGBA1C 6.0 (H)  06/21/2012   REPTSTATUS 12/30/2018 FINAL 12/25/2018   GRAMSTAIN  06/27/2017    ABUNDANT WBC PRESENT,BOTH PMN AND MONONUCLEAR RARE GRAM NEGATIVE RODS RARE GRAM NEGATIVE COCCI IN PAIRS    CULT  12/25/2018    NO GROWTH 5 DAYS Performed at [facility], [place] [address], [place], [place] [address]      Lab Results  Component Value Date   ALBUMIN 3.6 (L) 02/11/2019   ALBUMIN 2.2 (L) 12/25/2018   ALBUMIN 3.4 (L) 09/11/2018    Body mass index is 41.97 kg/m.  Orders:  No orders of the defined types were placed in this encounter.  No orders of the defined types were placed in this encounter.    Procedures: No procedures performed  Clinical Data: No additional findings.  ROS:  All other systems negative, except as noted in the HPI. Review of Systems  Objective: Vital Signs: Ht 5\' 7"  (1.702 m)   Wt 268 lb (121.6 kg)   LMP 11/21/2011   BMI 41.97 kg/m   Specialty Comments:  No specialty comments available.  PMFS History: Patient Active Problem List   Diagnosis Date Noted  . CKD (chronic kidney disease), stage III ([place]) 01/13/2019  . Infected wound 12/26/2018  . Idiopathic chronic venous hypertension of both lower extremities with ulcer and inflammation ([place])   . History of fall 05/10/2018  .  Hyperkalemia 02/21/2018  . Hypotension 02/21/2018  . Sepsis ([place]) 02/21/2018  . Encounter for central line placement   . Mitral regurgitation 10/30/2017  . Acute kidney injury ([place]) 07/21/2017  . Generalized weakness 07/20/2017  . Shock circulatory ([place])   . Acute respiratory failure with hypoxia ([place])   . Pulmonary edema   . Cardiac arrest ([place]) 06/26/2017  . Ventricular fibrillation ([place])   . Acute on chronic diastolic heart failure ([place])   . Abnormal ECG   . Cellulitis 04/02/2017  . AKI (acute kidney injury) ([place]) 12/17/2016  . Cellulitis and abscess of right lower extremity 12/17/2016  . Syncope and collapse 12/17/2016  . Peripheral edema 12/17/2016  .  Hypokalemia 12/17/2016  . Anemia of chronic disease 12/17/2016  . Acute diastolic CHF (congestive heart failure) ([place]) 03/14/2014  . CHF (congestive heart failure) ([place]) 03/12/2014  . Sarcoidosis ([place]) 03/12/2014  . Severe sepsis ([place]) 10/17/2013  . ARF (acute renal failure) ([place]) 10/08/2013  . Cellulitis of multiple sites of lower extremity 10/08/2013  . Morbid obesity ([place]) 10/08/2013  . Volume depletion 10/08/2013  . Venous (peripheral) insufficiency 10/28/2012  . Mediastinal lymphadenopathy 10/16/2012  . Pulmonary nodules 10/16/2012  . 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 Chest pain 09/02/2012  . Asthma   . Hypertension   . Depression   . Venous stasis ulcers ([place]) 07/29/2012  . 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 Venous ulcer of leg ([place]) 07/08/2012   Past Medical History:  Diagnosis Date  . Asthma   . CHF (congestive heart failure) ([place]) 03/12/2014   a. EF 40-45% by echo in 06/2017 with normal cors by cath. ICD placed following VT arrest  . Depression   . Headache(784.0)   . Hyperlipidemia   . Hypertension   . Lung nodules   . Renal disorder   . Sarcoidosis   . Ulcer    recurring, from chronic venous insufficiency    Family History  Problem Relation Age of Onset  . Heart failure Mother   . Diabetes Mellitus II Mother   . Hypertension Mother   . Cancer Mother        unknown type  . Heart disease Father   . Stroke Father   . Diabetes Mellitus II Father     Past Surgical History:  Procedure Laterality Date  . cataract surgery Left 07-2013  . I&D EXTREMITY Bilateral 12/31/2018   Procedure: DEBRIDEMENT BILATERAL LEGS, APPLY VAC X 2;  Surgeon: [name], MD;  Location: [facility];  Service: Orthopedics;  Laterality: Bilateral;  . I&D EXTREMITY Bilateral 01/02/2019   Procedure: REPEAT DEBRIDEMENT BILATERAL LEGS, APPLY VAC X 2;  Surgeon: [name], MD;  Location: [facility];   Service: Orthopedics;  Laterality: Bilateral;  . ICD IMPLANT N/A 07/05/2017   Procedure: ICD Implant;  Surgeon: Constance Haw, MD;  Location: [facility];  Service: Cardiovascular;  Laterality: N/A;  . LEFT HEART CATH AND CORONARY ANGIOGRAPHY N/A 07/03/2017   Procedure: Left Heart Cath and Coronary Angiography;  Surgeon: Belva Crome, MD;  Location: [facility];  Service: Cardiovascular;  Laterality: N/A;  . SKIN SPLIT GRAFT Bilateral 01/07/2019   Procedure: SPLIT THICKNESS SKIN GRAFT BILATERAL LEGS, APPLY VAC;  Surgeon: [name], MD;  Location: [facility];  Service: Orthopedics;  Laterality: Bilateral;   Social History   Occupational History  . Not on file  Tobacco Use  . Smoking status: Never  Smoker  . Smokeless tobacco: Never Used  Substance and Sexual Activity  . Alcohol use: No  . Drug use: No  . Sexual activity: Not Currently

## 2019-03-30 ENCOUNTER — Telehealth (INDEPENDENT_AMBULATORY_CARE_PROVIDER_SITE_OTHER): Payer: Self-pay

## 2019-03-30 NOTE — Telephone Encounter (Signed)
Called pt and she answered NO to all COVID-19 questions. Has an appt tomorrow @9am 

## 2019-03-31 ENCOUNTER — Ambulatory Visit (INDEPENDENT_AMBULATORY_CARE_PROVIDER_SITE_OTHER): Payer: Medicare Other | Admitting: Physician Assistant

## 2019-03-31 ENCOUNTER — Other Ambulatory Visit: Payer: Self-pay

## 2019-03-31 ENCOUNTER — Encounter (INDEPENDENT_AMBULATORY_CARE_PROVIDER_SITE_OTHER): Payer: Self-pay | Admitting: Orthopedic Surgery

## 2019-03-31 VITALS — Ht 67.0 in | Wt 268.0 lb

## 2019-03-31 DIAGNOSIS — Z6841 Body Mass Index (BMI) 40.0 and over, adult: Secondary | ICD-10-CM

## 2019-03-31 DIAGNOSIS — L97919 Non-pressure chronic ulcer of unspecified part of right lower leg with unspecified severity: Secondary | ICD-10-CM

## 2019-03-31 DIAGNOSIS — N183 Chronic kidney disease, stage 3 unspecified: Secondary | ICD-10-CM

## 2019-03-31 DIAGNOSIS — I87333 Chronic venous hypertension (idiopathic) with ulcer and inflammation of bilateral lower extremity: Secondary | ICD-10-CM

## 2019-03-31 DIAGNOSIS — L97929 Non-pressure chronic ulcer of unspecified part of left lower leg with unspecified severity: Secondary | ICD-10-CM

## 2019-03-31 DIAGNOSIS — Z945 Skin transplant status: Secondary | ICD-10-CM

## 2019-03-31 NOTE — Progress Notes (Signed)
Office Visit Note   Patient: Kari [name]           Date of Birth: Jun 29, 1961           MRN: [id number] Visit Date: 03/31/2019              Requested by: Rutherford Guys, MD [address] [place], [place] [address] PCP: Rutherford Guys, MD  Chief Complaint  Patient presents with  . Left Leg - Routine Post Op    01/07/19 SG BLE  . Right Leg - Routine Post Op      HPI: The patient is a 58 yo woman who is S/P split thickness skin graft to both lower extremities 01/07/2019. She has been treated with bilateral lower leg multi layer compression wraps and incorporated the grafts well and has continued to epithelialize. She has had Hydrofera blue dressings on for the past week and her edema remains much improved. She is taking protein supplements daily and vitamins daily.   Assessment & Plan: Visit Diagnoses:  1. S/P split thickness skin graft   2. Idiopathic chronic venous hypertension of both lower extremities with ulcer and inflammation ([place])   3. BMI 40.0-44.9, adult ([place])   4. CKD (chronic kidney disease), stage III ([place])     Plan: Will begin Vive compression socks around the clock except for showering daily. Have reviewed how to don and doff the socks and avoid wrinkling with the patient. Continue protein supplement and vitamins.  Follow up in 2 weeks or sooner if difficulties in the interim.   Follow-Up Instructions: Return in about 2 weeks (around 04/14/2019).   Ortho Exam  Patient is alert, oriented, no adenopathy, well-dressed, normal affect, normal respiratory effort. Bilateral lower leg ulcers continue to epithelialize over the residual wound beds. No signs of infection or cellulitis. Edema under good control with compression wraps and vive socks donned and demonstrated how to don/doff with the patient.   Imaging: No results found.    Labs: Lab Results  Component Value Date   HGBA1C 5.6 12/17/2016   HGBA1C 5.5 03/12/2014   HGBA1C 6.0 (H) 06/21/2012   REPTSTATUS 12/30/2018 FINAL 12/25/2018   GRAMSTAIN  06/27/2017    ABUNDANT WBC PRESENT,BOTH PMN AND MONONUCLEAR RARE GRAM NEGATIVE RODS RARE GRAM NEGATIVE COCCI IN PAIRS    CULT  12/25/2018    NO GROWTH 5 DAYS Performed at [facility], [place] [address] [place], [place] [address]      Lab Results  Component Value Date   ALBUMIN 3.6 (L) 02/11/2019   ALBUMIN 2.2 (L) 12/25/2018   ALBUMIN 3.4 (L) 09/11/2018    Body mass index is 41.97 kg/m.  Orders:  No orders of the defined types were placed in this encounter.  No orders of the defined types were placed in this encounter.    Procedures: No procedures performed  Clinical Data: No additional findings.  ROS:  All other systems negative, except as noted in the HPI. Review of Systems  Objective: Vital Signs: Ht 5\' 7"  (1.702 m)   Wt 268 lb (121.6 kg)   LMP 11/21/2011   BMI 41.97 kg/m   Specialty Comments:  No specialty comments available.  PMFS History: Patient Active Problem List   Diagnosis Date Noted  . CKD (chronic kidney disease), stage III ([place]) 01/13/2019  . Infected wound 12/26/2018  . Idiopathic chronic venous hypertension of both lower extremities with ulcer and inflammation ([place])   . History of fall 05/10/2018  . Hyperkalemia  02/21/2018  . Hypotension 02/21/2018  . Sepsis ([place]) 02/21/2018  . Encounter for central line placement   . Mitral regurgitation 10/30/2017  . Acute kidney injury ([place]) 07/21/2017  . Generalized weakness 07/20/2017  . Shock circulatory ([place])   . Acute respiratory failure with hypoxia ([place])   . Pulmonary edema   . Cardiac arrest ([place]) 06/26/2017  . Ventricular fibrillation ([place])   . Acute on chronic diastolic heart failure ([place])   . Abnormal ECG   . Cellulitis 04/02/2017  . AKI (acute kidney injury) ([place]) 12/17/2016  . Cellulitis and abscess of right lower extremity 12/17/2016  . Syncope and collapse 12/17/2016  . Peripheral edema 12/17/2016  . Hypokalemia  12/17/2016  . Anemia of chronic disease 12/17/2016  . Acute diastolic CHF (congestive heart failure) ([place]) 03/14/2014  . CHF (congestive heart failure) ([place]) 03/12/2014  . Sarcoidosis ([place]) 03/12/2014  . Severe sepsis ([place]) 10/17/2013  . ARF (acute renal failure) ([place]) 10/08/2013  . Cellulitis of multiple sites of lower extremity 10/08/2013  . Morbid obesity ([place]) 10/08/2013  . Volume depletion 10/08/2013  . Venous (peripheral) insufficiency 10/28/2012  . Mediastinal lymphadenopathy 10/16/2012  . Pulmonary nodules 10/16/2012  . 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 Chest pain 09/02/2012  . Asthma   . Hypertension   . Depression   . Venous stasis ulcers ([place]) 07/29/2012  . 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 Venous ulcer of leg ([place]) 07/08/2012   Past Medical History:  Diagnosis Date  . Asthma   . CHF (congestive heart failure) ([place]) 03/12/2014   a. EF 40-45% by echo in 06/2017 with normal cors by cath. ICD placed following VT arrest  . Depression   . Headache(784.0)   . Hyperlipidemia   . Hypertension   . Lung nodules   . Renal disorder   . Sarcoidosis   . Ulcer    recurring, from chronic venous insufficiency    Family History  Problem Relation Age of Onset  . Heart failure Mother   . Diabetes Mellitus II Mother   . Hypertension Mother   . Cancer Mother        unknown type  . Heart disease Father   . Stroke Father   . Diabetes Mellitus II Father     Past Surgical History:  Procedure Laterality Date  . cataract surgery Left 07-2013  . I&D EXTREMITY Bilateral 12/31/2018   Procedure: DEBRIDEMENT BILATERAL LEGS, APPLY VAC X 2;  Surgeon: [name], MD;  Location: [facility];  Service: Orthopedics;  Laterality: Bilateral;  . I&D EXTREMITY Bilateral 01/02/2019   Procedure: REPEAT DEBRIDEMENT BILATERAL LEGS, APPLY VAC X 2;  Surgeon: [name], MD;  Location: [facility];  Service:  Orthopedics;  Laterality: Bilateral;  . ICD IMPLANT N/A 07/05/2017   Procedure: ICD Implant;  Surgeon: Constance Haw, MD;  Location: [facility];  Service: Cardiovascular;  Laterality: N/A;  . LEFT HEART CATH AND CORONARY ANGIOGRAPHY N/A 07/03/2017   Procedure: Left Heart Cath and Coronary Angiography;  Surgeon: Belva Crome, MD;  Location: [facility];  Service: Cardiovascular;  Laterality: N/A;  . SKIN SPLIT GRAFT Bilateral 01/07/2019   Procedure: SPLIT THICKNESS SKIN GRAFT BILATERAL LEGS, APPLY VAC;  Surgeon: [name], MD;  Location: [facility];  Service: Orthopedics;  Laterality: Bilateral;   Social History   Occupational History  . Not on file  Tobacco Use  . Smoking status: Never Smoker  .  Smokeless tobacco: Never Used  Substance and Sexual Activity  . Alcohol use: No  . Drug use: No  . Sexual activity: Not Currently

## 2019-04-13 DIAGNOSIS — N183 Chronic kidney disease, stage 3 (moderate): Secondary | ICD-10-CM | POA: Diagnosis not present

## 2019-04-13 DIAGNOSIS — N189 Chronic kidney disease, unspecified: Secondary | ICD-10-CM | POA: Diagnosis not present

## 2019-04-14 ENCOUNTER — Ambulatory Visit (INDEPENDENT_AMBULATORY_CARE_PROVIDER_SITE_OTHER): Payer: Medicare Other | Admitting: Orthopedic Surgery

## 2019-04-14 ENCOUNTER — Other Ambulatory Visit: Payer: Self-pay

## 2019-04-14 ENCOUNTER — Encounter (INDEPENDENT_AMBULATORY_CARE_PROVIDER_SITE_OTHER): Payer: Self-pay | Admitting: Orthopedic Surgery

## 2019-04-14 VITALS — Ht 67.0 in | Wt 268.0 lb

## 2019-04-14 DIAGNOSIS — Z945 Skin transplant status: Secondary | ICD-10-CM | POA: Diagnosis not present

## 2019-04-14 DIAGNOSIS — L97929 Non-pressure chronic ulcer of unspecified part of left lower leg with unspecified severity: Secondary | ICD-10-CM | POA: Diagnosis not present

## 2019-04-14 DIAGNOSIS — L97919 Non-pressure chronic ulcer of unspecified part of right lower leg with unspecified severity: Secondary | ICD-10-CM

## 2019-04-14 DIAGNOSIS — I87333 Chronic venous hypertension (idiopathic) with ulcer and inflammation of bilateral lower extremity: Secondary | ICD-10-CM

## 2019-04-14 NOTE — Progress Notes (Signed)
Office Visit Note   Patient: Kari [name]           Date of Birth: [birth year]           MRN: [id number] Visit Date: 04/14/2019              Requested by: Rutherford Guys, MD [address] [place], [place] [address] PCP: Rutherford Guys, MD  Chief Complaint  Patient presents with  . Left Leg - Routine Post Op    01/07/2019 SG Bil legs  . Right Leg - Routine Post Op      HPI: Patient is a 58 year old woman who presents over 3 months status post split-thickness skin graft for venous ulcers both legs.  Patient states that her previous compression stockings were too tight.  She is currently using an Ace wrap and dry dressing.  Assessment & Plan: Visit Diagnoses:  1. Idiopathic chronic venous hypertension of both lower extremities with ulcer and inflammation ([place])   2. S/P split thickness skin graft     Plan: Patient was given a prescription to get a pair of double extra-large compression stockings 15 to 20 mm compression.  She will wear these around-the-clock  Follow-Up Instructions: Return in about 3 weeks (around 05/05/2019).   Ortho Exam  Patient is alert, oriented, no adenopathy, well-dressed, normal affect, normal respiratory effort. Examination patient has excellent healing of the massive venous stasis ulcers of both legs.  Photos were taken the wounds are approximately 50% healed.  Patient's calf measures 49 cm in circumference on the right and 50 cm in circumference on the left.  There is good healthy granulation tissue in the wound bed.  Imaging: No results found.    Labs: Lab Results  Component Value Date   HGBA1C 5.6 12/17/2016   HGBA1C 5.5 03/12/2014   HGBA1C 6.0 (H) 06/21/2012   REPTSTATUS 12/30/2018 FINAL 12/25/2018   GRAMSTAIN  06/27/2017    ABUNDANT WBC PRESENT,BOTH PMN AND MONONUCLEAR RARE GRAM NEGATIVE RODS RARE GRAM NEGATIVE COCCI IN PAIRS    CULT  12/25/2018    NO GROWTH 5 DAYS Performed at [facility], [place] [address]  [place], [place] [address]      Lab Results  Component Value Date   ALBUMIN 3.6 (L) 02/11/2019   ALBUMIN 2.2 (L) 12/25/2018   ALBUMIN 3.4 (L) 09/11/2018    Body mass index is 41.97 kg/m.  Orders:  No orders of the defined types were placed in this encounter.  No orders of the defined types were placed in this encounter.    Procedures: No procedures performed  Clinical Data: No additional findings.  ROS:  All other systems negative, except as noted in the HPI. Review of Systems  Objective: Vital Signs: Ht 5\' 7"  (1.702 m)   Wt 268 lb (121.6 kg)   LMP 11/21/2011   BMI 41.97 kg/m   Specialty Comments:  No specialty comments available.  PMFS History: Patient Active Problem List   Diagnosis Date Noted  . CKD (chronic kidney disease), stage III ([place]) 01/13/2019  . Infected wound 12/26/2018  . Idiopathic chronic venous hypertension of both lower extremities with ulcer and inflammation ([place])   . History of fall 05/10/2018  . Hyperkalemia 02/21/2018  . Hypotension 02/21/2018  . Sepsis ([place]) 02/21/2018  . Encounter for central line placement   . Mitral regurgitation 10/30/2017  . Acute kidney injury ([place]) 07/21/2017  . Generalized weakness 07/20/2017  . Shock circulatory ([place])   . Acute respiratory failure  with hypoxia ([place])   . Pulmonary edema   . Cardiac arrest ([place]) 06/26/2017  . Ventricular fibrillation ([place])   . Acute on chronic diastolic heart failure ([place])   . Abnormal ECG   . Cellulitis 04/02/2017  . AKI (acute kidney injury) ([place]) 12/17/2016  . Cellulitis and abscess of right lower extremity 12/17/2016  . Syncope and collapse 12/17/2016  . Peripheral edema 12/17/2016  . Hypokalemia 12/17/2016  . Anemia of chronic disease 12/17/2016  . Acute diastolic CHF (congestive heart failure) ([place]) 03/14/2014  . CHF (congestive heart failure) ([place]) 03/12/2014  . Sarcoidosis ([place]) 03/12/2014  . Severe sepsis ([place]) 10/17/2013  . ARF (acute renal failure) ([place])  10/08/2013  . Cellulitis of multiple sites of lower extremity 10/08/2013  . Morbid obesity ([place]) 10/08/2013  . Volume depletion 10/08/2013  . Venous (peripheral) insufficiency 10/28/2012  . Mediastinal lymphadenopathy 10/16/2012  . Pulmonary nodules 10/16/2012  . 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 Chest pain 09/02/2012  . Asthma   . Hypertension   . Depression   . Venous stasis ulcers ([place]) 07/29/2012  . Varicose veins of lower extremities with ulcer ([place]) 07/08/2012  . Venous ulcer of leg ([place]) 07/08/2012   Past Medical History:  Diagnosis Date  . Asthma   . CHF (congestive heart failure) ([place]) 03/12/2014   a. EF 40-45% by echo in 06/2017 with normal cors by cath. ICD placed following VT arrest  . Depression   . Headache(784.0)   . Hyperlipidemia   . Hypertension   . Lung nodules   . Renal disorder   . Sarcoidosis   . Ulcer    recurring, from chronic venous insufficiency    Family History  Problem Relation Age of Onset  . Heart failure Mother   . Diabetes Mellitus II Mother   . Hypertension Mother   . Cancer Mother        unknown type  . Heart disease Father   . Stroke Father   . Diabetes Mellitus II Father     Past Surgical History:  Procedure Laterality Date  . cataract surgery Left 07-2013  . I&D EXTREMITY Bilateral 12/31/2018   Procedure: DEBRIDEMENT BILATERAL LEGS, APPLY VAC X 2;  Surgeon: [name], MD;  Location: [facility];  Service: Orthopedics;  Laterality: Bilateral;  . I&D EXTREMITY Bilateral 01/02/2019   Procedure: REPEAT DEBRIDEMENT BILATERAL LEGS, APPLY VAC X 2;  Surgeon: [name], MD;  Location: [facility];  Service: Orthopedics;  Laterality: Bilateral;  . ICD IMPLANT N/A 07/05/2017   Procedure: ICD Implant;  Surgeon: Constance Haw, MD;  Location: [facility];  Service: Cardiovascular;  Laterality: N/A;  . LEFT HEART CATH AND CORONARY ANGIOGRAPHY N/A 07/03/2017   Procedure: Left  Heart Cath and Coronary Angiography;  Surgeon: Belva Crome, MD;  Location: [facility];  Service: Cardiovascular;  Laterality: N/A;  . SKIN SPLIT GRAFT Bilateral 01/07/2019   Procedure: SPLIT THICKNESS SKIN GRAFT BILATERAL LEGS, APPLY VAC;  Surgeon: [name], MD;  Location: [facility];  Service: Orthopedics;  Laterality: Bilateral;   Social History   Occupational History  . Not on file  Tobacco Use  . Smoking status: Never Smoker  . Smokeless tobacco: Never Used  Substance and Sexual Activity  . Alcohol use: No  . Drug use: No  . Sexual activity: Not Currently

## 2019-04-15 ENCOUNTER — Other Ambulatory Visit: Payer: Self-pay | Admitting: Adult Health

## 2019-04-15 DIAGNOSIS — I10 Essential (primary) hypertension: Secondary | ICD-10-CM

## 2019-04-15 DIAGNOSIS — I5022 Chronic systolic (congestive) heart failure: Secondary | ICD-10-CM

## 2019-04-21 ENCOUNTER — Other Ambulatory Visit (HOSPITAL_COMMUNITY): Payer: Self-pay | Admitting: *Deleted

## 2019-04-21 NOTE — Discharge Instructions (Signed)

## 2019-04-22 ENCOUNTER — Encounter (HOSPITAL_COMMUNITY)
Admission: RE | Admit: 2019-04-22 | Discharge: 2019-04-22 | Disposition: A | Discharge status: Home / Self Care | Payer: Medicare Other | Source: Ambulatory Visit | Attending: Nephrology | Admitting: Nephrology

## 2019-04-22 ENCOUNTER — Other Ambulatory Visit: Payer: Self-pay

## 2019-04-22 DIAGNOSIS — D631 Anemia in chronic kidney disease: Secondary | ICD-10-CM | POA: Diagnosis not present

## 2019-04-22 MED ORDER — SODIUM CHLORIDE 0.9 % IV SOLN
510.0000 mg | INTRAVENOUS | Status: DC
  Administered 2019-04-22: 510 mg via INTRAVENOUS
  Filled 2019-04-22: qty 510

## 2019-04-23 DIAGNOSIS — N183 Chronic kidney disease, stage 3 (moderate): Secondary | ICD-10-CM | POA: Diagnosis not present

## 2019-04-23 DIAGNOSIS — M109 Gout, unspecified: Secondary | ICD-10-CM | POA: Diagnosis not present

## 2019-04-23 DIAGNOSIS — E559 Vitamin D deficiency, unspecified: Secondary | ICD-10-CM | POA: Diagnosis not present

## 2019-04-23 DIAGNOSIS — E875 Hyperkalemia: Secondary | ICD-10-CM | POA: Diagnosis not present

## 2019-04-23 DIAGNOSIS — D631 Anemia in chronic kidney disease: Secondary | ICD-10-CM | POA: Diagnosis not present

## 2019-04-23 DIAGNOSIS — I129 Hypertensive chronic kidney disease with stage 1 through stage 4 chronic kidney disease, or unspecified chronic kidney disease: Secondary | ICD-10-CM | POA: Diagnosis not present

## 2019-04-28 ENCOUNTER — Other Ambulatory Visit: Payer: Self-pay

## 2019-04-29 ENCOUNTER — Ambulatory Visit (HOSPITAL_COMMUNITY)
Admission: RE | Admit: 2019-04-29 | Discharge: 2019-04-29 | Disposition: A | Discharge status: Home / Self Care | Payer: Medicare Other | Source: Ambulatory Visit | Attending: Nephrology | Admitting: Nephrology

## 2019-04-29 DIAGNOSIS — N189 Chronic kidney disease, unspecified: Secondary | ICD-10-CM | POA: Insufficient documentation

## 2019-04-29 DIAGNOSIS — D631 Anemia in chronic kidney disease: Secondary | ICD-10-CM | POA: Diagnosis not present

## 2019-04-29 MED ORDER — SODIUM CHLORIDE 0.9 % IV SOLN
510.0000 mg | INTRAVENOUS | Status: AC
  Administered 2019-04-29: 510 mg via INTRAVENOUS
  Filled 2019-04-29: qty 17

## 2019-05-04 NOTE — Progress Notes (Signed)
  Progress Note   Date: 05/04/2019  Patient Name: Kari [name]        MRN#: [id number]  Clarification of the patient's final diagnosis:   Diagnosis: anemia due to CKD.    Dron Tanna Furry, MD

## 2019-05-05 ENCOUNTER — Other Ambulatory Visit: Payer: Self-pay

## 2019-05-05 ENCOUNTER — Encounter: Payer: Self-pay | Admitting: Orthopedic Surgery

## 2019-05-05 ENCOUNTER — Ambulatory Visit (INDEPENDENT_AMBULATORY_CARE_PROVIDER_SITE_OTHER): Payer: Medicare Other | Admitting: Physician Assistant

## 2019-05-05 VITALS — Ht 67.0 in | Wt 299.0 lb

## 2019-05-05 DIAGNOSIS — N183 Chronic kidney disease, stage 3 unspecified: Secondary | ICD-10-CM

## 2019-05-05 DIAGNOSIS — Z945 Skin transplant status: Secondary | ICD-10-CM

## 2019-05-05 DIAGNOSIS — I87333 Chronic venous hypertension (idiopathic) with ulcer and inflammation of bilateral lower extremity: Secondary | ICD-10-CM

## 2019-05-05 DIAGNOSIS — L97919 Non-pressure chronic ulcer of unspecified part of right lower leg with unspecified severity: Secondary | ICD-10-CM

## 2019-05-05 DIAGNOSIS — L97929 Non-pressure chronic ulcer of unspecified part of left lower leg with unspecified severity: Secondary | ICD-10-CM

## 2019-05-05 NOTE — Progress Notes (Signed)
Office Visit Note   Patient: Kari [name]           Date of Birth: Apr 17, 1961           MRN: [id number] Visit Date: 05/05/2019              Requested by: Rutherford Guys, MD [address] [place], [place] [address] PCP: Rutherford Guys, MD  Chief Complaint  Patient presents with  . Right Leg - Follow-up  . Left Leg - Follow-up      HPI: The patient is a 58 yo woman who is S/P split thickness skin graft of the lower legs 01/07/2019.  She is 4 months post op The grafts are continuing to heal in nicely.  She normally wears her medical compression socks but did not have them on today in order for [place] to more easily examine as well as following her bathing.  She is pleased with her progress.  Assessment & Plan: Visit Diagnoses:  1. Idiopathic chronic venous hypertension of both lower extremities with ulcer and inflammation ([place])   2. S/P split thickness skin graft   3. CKD (chronic kidney disease), stage III ([place])     Plan: Continue medical compression socks around the clock except for showering. Follow up in 4 weeks.   Follow-Up Instructions: Return in about 4 weeks (around 06/02/2019).   Ortho Exam  Patient is alert, oriented, no adenopathy, well-dressed, normal affect, normal respiratory effort. Bilateral lower leg wounds continue to contract and epithelialize slowly from the borders.  She continues to have edema but it is generally well controlled with her medical compression socks.  She does have moderate serous weeping but no odor or signs of cellulitis or infection.  Imaging: No results found. No images are attached to the encounter.      Labs: Lab Results  Component Value Date   HGBA1C 5.6 12/17/2016   HGBA1C 5.5 03/12/2014   HGBA1C 6.0 (H) 06/21/2012   REPTSTATUS 12/30/2018 FINAL 12/25/2018   GRAMSTAIN  06/27/2017    ABUNDANT WBC PRESENT,BOTH PMN AND MONONUCLEAR RARE GRAM NEGATIVE RODS RARE GRAM NEGATIVE COCCI IN PAIRS    CULT  12/25/2018    NO GROWTH  5 DAYS Performed at [facility], [place] [address] [place], [place] [address]      Lab Results  Component Value Date   ALBUMIN 3.6 (L) 02/11/2019   ALBUMIN 2.2 (L) 12/25/2018   ALBUMIN 3.4 (L) 09/11/2018    Body mass index is 46.83 kg/m.  Orders:  No orders of the defined types were placed in this encounter.  No orders of the defined types were placed in this encounter.    Procedures: No procedures performed  Clinical Data: No additional findings.  ROS:  All other systems negative, except as noted in the HPI. Review of Systems  Objective: Vital Signs: Ht 5\' 7"  (1.702 m)   Wt 299 lb (135.6 kg)   LMP 11/21/2011   BMI 46.83 kg/m   Specialty Comments:  No specialty comments available.  PMFS History: Patient Active Problem List   Diagnosis Date Noted  . CKD (chronic kidney disease), stage III ([place]) 01/13/2019  . Infected wound 12/26/2018  . Idiopathic chronic venous hypertension of both lower extremities with ulcer and inflammation ([place])   . History of fall 05/10/2018  . Hyperkalemia 02/21/2018  . Hypotension 02/21/2018  . Sepsis ([place]) 02/21/2018  . Encounter for central line placement   . Mitral regurgitation 10/30/2017  . Acute kidney  injury ([place]) 07/21/2017  . Generalized weakness 07/20/2017  . Shock circulatory ([place])   . Acute respiratory failure with hypoxia ([place])   . Pulmonary edema   . Cardiac arrest ([place]) 06/26/2017  . Ventricular fibrillation ([place])   . Acute on chronic diastolic heart failure ([place])   . Abnormal ECG   . Cellulitis 04/02/2017  . AKI (acute kidney injury) ([place]) 12/17/2016  . Cellulitis and abscess of right lower extremity 12/17/2016  . Syncope and collapse 12/17/2016  . Peripheral edema 12/17/2016  . Hypokalemia 12/17/2016  . Anemia of chronic disease 12/17/2016  . Acute diastolic CHF (congestive heart failure) ([place]) 03/14/2014  . CHF (congestive heart failure) ([place]) 03/12/2014  . Sarcoidosis ([place]) 03/12/2014  . Severe  sepsis ([place]) 10/17/2013  . ARF (acute renal failure) ([place]) 10/08/2013  . Cellulitis of multiple sites of lower extremity 10/08/2013  . Morbid obesity ([place]) 10/08/2013  . Volume depletion 10/08/2013  . Venous (peripheral) insufficiency 10/28/2012  . Mediastinal lymphadenopathy 10/16/2012  . Pulmonary nodules 10/16/2012  . 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 Chest pain 09/02/2012  . Asthma   . Hypertension   . Depression   . Venous stasis ulcers ([place]) 07/29/2012  . Varicose veins of lower extremities with ulcer ([place]) 07/08/2012  . Venous ulcer of leg ([place]) 07/08/2012   Past Medical History:  Diagnosis Date  . Asthma   . CHF (congestive heart failure) ([place]) 03/12/2014   a. EF 40-45% by echo in 06/2017 with normal cors by cath. ICD placed following VT arrest  . Depression   . Headache(784.0)   . Hyperlipidemia   . Hypertension   . Lung nodules   . Renal disorder   . Sarcoidosis   . Ulcer    recurring, from chronic venous insufficiency    Family History  Problem Relation Age of Onset  . Heart failure Mother   . Diabetes Mellitus II Mother   . Hypertension Mother   . Cancer Mother        unknown type  . Heart disease Father   . Stroke Father   . Diabetes Mellitus II Father     Past Surgical History:  Procedure Laterality Date  . cataract surgery Left 07-2013  . I&D EXTREMITY Bilateral 12/31/2018   Procedure: DEBRIDEMENT BILATERAL LEGS, APPLY VAC X 2;  Surgeon: [name], MD;  Location: [facility];  Service: Orthopedics;  Laterality: Bilateral;  . I&D EXTREMITY Bilateral 01/02/2019   Procedure: REPEAT DEBRIDEMENT BILATERAL LEGS, APPLY VAC X 2;  Surgeon: [name], MD;  Location: [facility];  Service: Orthopedics;  Laterality: Bilateral;  . ICD IMPLANT N/A 07/05/2017   Procedure: ICD Implant;  Surgeon: Constance Haw, MD;  Location: [facility];  Service: Cardiovascular;  Laterality: N/A;  . LEFT HEART CATH  AND CORONARY ANGIOGRAPHY N/A 07/03/2017   Procedure: Left Heart Cath and Coronary Angiography;  Surgeon: Belva Crome, MD;  Location: [facility];  Service: Cardiovascular;  Laterality: N/A;  . SKIN SPLIT GRAFT Bilateral 01/07/2019   Procedure: SPLIT THICKNESS SKIN GRAFT BILATERAL LEGS, APPLY VAC;  Surgeon: [name], MD;  Location: [facility];  Service: Orthopedics;  Laterality: Bilateral;   Social History   Occupational History  . Not on file  Tobacco Use  . Smoking status: Never Smoker  . Smokeless tobacco: Never Used  Substance and Sexual Activity  . Alcohol use: No  . Drug use: No  . Sexual activity: Not Currently

## 2019-05-06 ENCOUNTER — Encounter: Payer: Self-pay | Admitting: Orthopedic Surgery

## 2019-05-13 ENCOUNTER — Telehealth (INDEPENDENT_AMBULATORY_CARE_PROVIDER_SITE_OTHER): Payer: Medicare Other | Admitting: Family Medicine

## 2019-05-13 ENCOUNTER — Other Ambulatory Visit: Payer: Self-pay

## 2019-05-13 DIAGNOSIS — Z945 Skin transplant status: Secondary | ICD-10-CM | POA: Insufficient documentation

## 2019-05-13 DIAGNOSIS — I872 Venous insufficiency (chronic) (peripheral): Secondary | ICD-10-CM

## 2019-05-13 DIAGNOSIS — M79604 Pain in right leg: Secondary | ICD-10-CM | POA: Diagnosis not present

## 2019-05-13 DIAGNOSIS — N183 Chronic kidney disease, stage 3 unspecified: Secondary | ICD-10-CM

## 2019-05-13 DIAGNOSIS — M79605 Pain in left leg: Secondary | ICD-10-CM | POA: Diagnosis not present

## 2019-05-13 DIAGNOSIS — I5022 Chronic systolic (congestive) heart failure: Secondary | ICD-10-CM

## 2019-05-13 DIAGNOSIS — D508 Other iron deficiency anemias: Secondary | ICD-10-CM

## 2019-05-13 MED ORDER — FUROSEMIDE 40 MG PO TABS: 60.0000 mg | ORAL_TABLET | Freq: Every day | ORAL | 5 refills | Status: DC

## 2019-05-13 MED ORDER — HYDROCODONE-ACETAMINOPHEN 5-325 MG PO TABS: 1.0000 | ORAL_TABLET | Freq: Four times a day (QID) | ORAL | 0 refills | Status: DC | PRN

## 2019-05-13 MED ORDER — ALLOPURINOL 100 MG PO TABS: 200.0000 mg | ORAL_TABLET | Freq: Every day | ORAL | 1 refills | Status: DC

## 2019-05-13 MED ORDER — FERROUS SULFATE 325 (65 FE) MG PO TABS: 325.0000 mg | ORAL_TABLET | Freq: Two times a day (BID) | ORAL | Status: DC

## 2019-05-13 NOTE — Progress Notes (Signed)
Spoke with pt this Morning and she states she needs a 3 month follow-up. She states her main concern is refills on her medication. She needs her pain medication and some other refills after she ask a few questions about medication. She states she feels great at this time.

## 2019-05-13 NOTE — Progress Notes (Signed)
Virtual Visit Note  I connected with patient on 05/13/19 at 1116 am by phone and verified that I am speaking with the correct person using two identifiers. Kari [name] is currently located at home and patient is currently with them during visit. The provider, Rutherford Guys, MD is located in their office at time of visit.  I discussed the limitations, risks, security and privacy concerns of performing an evaluation and management service by telephone and the availability of in person appointments. I also discussed with the patient that there may be a patient responsible charge related to this service. The patient expressed understanding and agreed to proceed.   CC: 3 month followup  HPI ? Patient is a 58 y.o. female with past medical history significant for s/p cardiac arrest in 2018 now with ACID,CHF, CKD3, venous stasis ulcers s/p skin graftswho presents today forroutine followup  Last OV Feb 2020 Has continued to see wound care and Dr Sharol Given, per last notes, skin grafts healing well, using compression stockings daily Renal doing iron infusions, increased oral iron BID Missed cards appt Continues to have leg pain, not sharp, no numbness or tingling, it is achy pain, at skin graft site and her feet Recently pain has increased, discussed at last wound care OV, unclear etiology, they would not refill pain medications Denies any gout flare ups Limited due to CKD pmp reviewed, takes very prn Needs refills of medications Home health still coming out to her house Did not get approved for increase hours for home aide  Allergies  Allergen Reactions  . Tramadol Other (See Comments)    hallucination    Prior to Admission medications   Medication Sig Start Date End Date Taking? Authorizing Provider  acetaminophen (TYLENOL) 500 MG tablet Take 1,000 mg by mouth every 6 (six) hours as needed for mild pain.   Yes [provider]  albuterol (PROVENTIL HFA;VENTOLIN HFA) 108  (90 Base) MCG/ACT inhaler Inhale 2 puffs into the lungs every 6 (six) hours as needed for shortness of breath. 02/02/19  Yes Medina-Vargas, Monina C, NP  allopurinol (ZYLOPRIM) 100 MG tablet Take 200 mg by mouth daily. 05/04/19  Yes [provider]  aspirin EC 81 MG tablet Take 81 mg by mouth daily.   Yes [provider]  carvedilol (COREG) 6.25 MG tablet Take 1 tablet (6.25 mg total) by mouth 2 (two) times daily. 02/02/19  Yes Medina-Vargas, Monina C, NP  Cholecalciferol (VITAMIN D) 50 MCG (2000 UT) CAPS Take 2,000 Units by mouth daily.   Yes [provider]  ferrous sulfate 325 (65 FE) MG tablet Take 325 mg by mouth daily with breakfast.   Yes [provider]  fluticasone (FLONASE) 50 MCG/ACT nasal spray Place 1 spray into both nostrils 2 (two) times daily. 02/11/19  Yes Rutherford Guys, MD  furosemide (LASIX) 40 MG tablet Take 1.5 tablets (60 mg total) by mouth daily. Take 1-1/2 tablets to = 60 mg 02/02/19  Yes Medina-Vargas, Monina C, NP  HYDROcodone-acetaminophen (NORCO/VICODIN) 5-325 MG tablet Take 1 tablet by mouth every 6 (six) hours as needed for moderate pain. 02/02/19  Yes Rayburn, Neta Mends, PA-C  loratadine (CLARITIN) 10 MG tablet Take 1 tablet (10 mg total) by mouth daily as needed for allergies. 02/02/19  Yes Medina-Vargas, Monina C, NP  febuxostat (ULORIC) 40 MG tablet Take 1 tablet (40 mg total) by mouth daily. Patient not taking: Reported on 05/13/2019 02/02/19   Medina-Vargas, Senaida Lange, NP    Past Medical  History:  Diagnosis Date  . Asthma   . CHF (congestive heart failure) ([place]) 03/12/2014   a. EF 40-45% by echo in 06/2017 with normal cors by cath. ICD placed following VT arrest  . Depression   . Headache(784.0)   . Hyperlipidemia   . Hypertension   . Lung nodules   . Renal disorder   . Sarcoidosis   . Ulcer    recurring, from chronic venous insufficiency    Past Surgical History:  Procedure Laterality Date  . cataract surgery  Left 07-2013  . I&D EXTREMITY Bilateral 12/31/2018   Procedure: DEBRIDEMENT BILATERAL LEGS, APPLY VAC X 2;  Surgeon: [name], MD;  Location: [facility];  Service: Orthopedics;  Laterality: Bilateral;  . I&D EXTREMITY Bilateral 01/02/2019   Procedure: REPEAT DEBRIDEMENT BILATERAL LEGS, APPLY VAC X 2;  Surgeon: [name], MD;  Location: [facility];  Service: Orthopedics;  Laterality: Bilateral;  . ICD IMPLANT N/A 07/05/2017   Procedure: ICD Implant;  Surgeon: Constance Haw, MD;  Location: [facility];  Service: Cardiovascular;  Laterality: N/A;  . LEFT HEART CATH AND CORONARY ANGIOGRAPHY N/A 07/03/2017   Procedure: Left Heart Cath and Coronary Angiography;  Surgeon: Belva Crome, MD;  Location: [facility];  Service: Cardiovascular;  Laterality: N/A;  . SKIN SPLIT GRAFT Bilateral 01/07/2019   Procedure: SPLIT THICKNESS SKIN GRAFT BILATERAL LEGS, APPLY VAC;  Surgeon: [name], MD;  Location: [facility];  Service: Orthopedics;  Laterality: Bilateral;    Social History   Tobacco Use  . Smoking status: Never Smoker  . Smokeless tobacco: Never Used  Substance Use Topics  . Alcohol use: No    Family History  Problem Relation Age of Onset  . Heart failure Mother   . Diabetes Mellitus II Mother   . Hypertension Mother   . Cancer Mother        unknown type  . Heart disease Father   . Stroke Father   . Diabetes Mellitus II Father     Review of Systems  Constitutional: Negative for chills and fever.  Respiratory: Negative for cough and shortness of breath.   Cardiovascular: Negative for chest pain, palpitations and leg swelling.  Gastrointestinal: Negative for abdominal pain, nausea and vomiting.   Per hpi  Objective  Vitals as reported by the patient: none   ASSESSMENT and PLAN  1. Leg pain, bilateral Unclear cause. Present prior to skin grafts. Unable to assess currently due to covid 19. Pmp reviewed. She takes vicodin sparingly. Reviewed r/se/b, med  refilled. Evaluate further at next OV  2. Stage 3 chronic kidney disease ([place]) Managed by renal, last OV April, followup in 6 months  3. Other iron deficiency anemia Managed by renal Completed iron infusions, now on oral supplement  4. Chronic systolic CHF (congestive heart failure) ([facility]) Controlled. Continue current regime. - furosemide (LASIX) 40 MG tablet; Take 1.5 tablets (60 mg total) by mouth daily. Take 1-1/2 tablets to = 60 mg  5. Venous (peripheral) insufficiency 6. H/O skin graft Healing well. Under wound care mgt. Wearing compression stockings daily.   Other orders - allopurinol (ZYLOPRIM) 100 MG tablet; Take 2 tablets (200 mg total) by mouth daily. - HYDROcodone-acetaminophen (NORCO/VICODIN) 5-325 MG tablet; Take 1 tablet by mouth every 6 (six) hours as needed for moderate pain.  FOLLOW-UP: 3 months   The above assessment and management plan was discussed with the patient. The patient verbalized understanding of and has agreed to the management  plan. Patient is aware to call the clinic if symptoms persist or worsen. Patient is aware when to return to the clinic for a follow-up visit. Patient educated on when it is appropriate to go to the emergency department.    I provided 23 minutes of non-face-to-face time during this encounter.  Rutherford Guys, MD Primary Care at [place], [place] [address] Ph.  [phone] Fax [phone]

## 2019-06-02 ENCOUNTER — Encounter: Payer: Self-pay | Admitting: Orthopedic Surgery

## 2019-06-02 ENCOUNTER — Other Ambulatory Visit: Payer: Self-pay | Admitting: Physician Assistant

## 2019-06-02 ENCOUNTER — Ambulatory Visit (INDEPENDENT_AMBULATORY_CARE_PROVIDER_SITE_OTHER): Payer: Medicare Other | Admitting: Physician Assistant

## 2019-06-02 ENCOUNTER — Other Ambulatory Visit: Payer: Self-pay

## 2019-06-02 DIAGNOSIS — Z945 Skin transplant status: Secondary | ICD-10-CM

## 2019-06-02 DIAGNOSIS — L97929 Non-pressure chronic ulcer of unspecified part of left lower leg with unspecified severity: Secondary | ICD-10-CM

## 2019-06-02 DIAGNOSIS — Z6841 Body Mass Index (BMI) 40.0 and over, adult: Secondary | ICD-10-CM | POA: Diagnosis not present

## 2019-06-02 DIAGNOSIS — L97919 Non-pressure chronic ulcer of unspecified part of right lower leg with unspecified severity: Secondary | ICD-10-CM | POA: Diagnosis not present

## 2019-06-02 DIAGNOSIS — N183 Chronic kidney disease, stage 3 unspecified: Secondary | ICD-10-CM

## 2019-06-02 DIAGNOSIS — I87333 Chronic venous hypertension (idiopathic) with ulcer and inflammation of bilateral lower extremity: Secondary | ICD-10-CM | POA: Diagnosis not present

## 2019-06-02 MED ORDER — CIPROFLOXACIN HCL 500 MG PO TABS: 500.0000 mg | ORAL_TABLET | Freq: Two times a day (BID) | ORAL | 0 refills | Status: DC

## 2019-06-02 NOTE — Progress Notes (Signed)
Office Visit Note   Patient: Kari [name]           Date of Birth: 04-19-1961           MRN: [id number] Visit Date: 06/02/2019              Requested by: Rutherford Guys, MD [address] [place], [place] [address] PCP: Rutherford Guys, MD  Chief Complaint  Patient presents with  . Right Leg - Follow-up, Wound Check  . Left Leg - Follow-up, Wound Check      HPI: The patient is a 58 yo woman who is S/P Skin grafts to bilateral lower legs in 12/2018 and comes in today for follow up of her bilateral lower leg residual ulcers. She reports she is unable to tolerate compression socks as they hurt too much. She has been using Dial soap to the ulcers and thinks this is causing problems and she reports severe burning pain using the soap. She has had increased drainage and yellow greenish drainage. She notes no fever or chills, but worsening edema.   Assessment & Plan: Visit Diagnoses:  1. Idiopathic chronic venous hypertension of both lower extremities with ulcer and inflammation ([place])   2. S/P split thickness skin graft   3. CKD (chronic kidney disease), stage III ([place])   4. BMI 40.0-44.9, adult [facility])     Plan: Dr. Sharol Given discussed return to the OR later this week for repeat debridement of the ulcers and placement of VAC for several days and then plan grafts once ulcers are ready. Plan cultures in the OR, no antibiotics for now.  Will apply Silvercel to bilateral lower leg ulcers and then Dynaflex compression wraps.    Follow-Up Instructions: Return in about 10 days (around 06/12/2019).   Ortho Exam  Patient is alert, oriented, no adenopathy, well-dressed, normal affect, normal respiratory effort. Bilateral lower leg ulcers with increased yellow fibrinous tissue and marked edema. No signs of cellulitis. Palpable pedal pulses. Will plan for compression wraps for the next couple of days with silvercel to the wound beds. Plan for OR later this week for debridement.   Imaging: No  results found.    Labs: Lab Results  Component Value Date   HGBA1C 5.6 12/17/2016   HGBA1C 5.5 03/12/2014   HGBA1C 6.0 (H) 06/21/2012   REPTSTATUS 12/30/2018 FINAL 12/25/2018   GRAMSTAIN  06/27/2017    ABUNDANT WBC PRESENT,BOTH PMN AND MONONUCLEAR RARE GRAM NEGATIVE RODS RARE GRAM NEGATIVE COCCI IN PAIRS    CULT  12/25/2018    NO GROWTH 5 DAYS Performed at [facility], [place] [address] [place], [place] [address]      Lab Results  Component Value Date   ALBUMIN 3.6 (L) 02/11/2019   ALBUMIN 2.2 (L) 12/25/2018   ALBUMIN 3.4 (L) 09/11/2018    There is no height or weight on file to calculate BMI.  Orders:  No orders of the defined types were placed in this encounter.  Meds ordered this encounter  Medications  . DISCONTD: ciprofloxacin (CIPRO) 500 MG tablet    Sig: Take 1 tablet (500 mg total) by mouth 2 (two) times daily.    Dispense:  28 tablet    Refill:  0     Procedures: No procedures performed  Clinical Data: No additional findings.  ROS:  All other systems negative, except as noted in the HPI. Review of Systems  Objective: Vital Signs: LMP 11/21/2011   Specialty Comments:  No specialty comments available.  PMFS History: Patient Active Problem List   Diagnosis Date Noted  . H/O skin graft 05/13/2019  . CKD (chronic kidney disease), stage III ([place]) 01/13/2019  . Infected wound 12/26/2018  . Idiopathic chronic venous hypertension of both lower extremities with ulcer and inflammation ([place])   . History of fall 05/10/2018  . Hyperkalemia 02/21/2018  . Hypotension 02/21/2018  . Sepsis ([place]) 02/21/2018  . Encounter for central line placement   . Mitral regurgitation 10/30/2017  . Acute kidney injury ([place]) 07/21/2017  . Generalized weakness 07/20/2017  . Shock circulatory ([place])   . Acute respiratory failure with hypoxia ([place])   . Pulmonary edema   . Cardiac arrest ([place]) 06/26/2017  . Ventricular fibrillation ([place])   . Acute on chronic  diastolic heart failure ([place])   . Abnormal ECG   . Cellulitis 04/02/2017  . AKI (acute kidney injury) ([place]) 12/17/2016  . Cellulitis and abscess of right lower extremity 12/17/2016  . Syncope and collapse 12/17/2016  . Peripheral edema 12/17/2016  . Hypokalemia 12/17/2016  . Anemia of chronic disease 12/17/2016  . Acute diastolic CHF (congestive heart failure) ([place]) 03/14/2014  . CHF (congestive heart failure) ([place]) 03/12/2014  . Sarcoidosis ([place]) 03/12/2014  . Severe sepsis ([place]) 10/17/2013  . ARF (acute renal failure) ([place]) 10/08/2013  . Cellulitis of multiple sites of lower extremity 10/08/2013  . Morbid obesity ([place]) 10/08/2013  . Volume depletion 10/08/2013  . Venous (peripheral) insufficiency 10/28/2012  . Mediastinal lymphadenopathy 10/16/2012  . Pulmonary nodules 10/16/2012  . 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 Chest pain 09/02/2012  . Asthma   . Hypertension   . Depression   . Venous stasis ulcers ([place]) 07/29/2012  . Varicose veins of lower extremities with ulcer ([place]) 07/08/2012  . Venous ulcer of leg ([place]) 07/08/2012   Past Medical History:  Diagnosis Date  . Asthma   . CHF (congestive heart failure) ([place]) 03/12/2014   a. EF 40-45% by echo in 06/2017 with normal cors by cath. ICD placed following VT arrest  . Depression   . Headache(784.0)   . Hyperlipidemia   . Hypertension   . Lung nodules   . Renal disorder   . Sarcoidosis   . Ulcer    recurring, from chronic venous insufficiency    Family History  Problem Relation Age of Onset  . Heart failure Mother   . Diabetes Mellitus II Mother   . Hypertension Mother   . Cancer Mother        unknown type  . Heart disease Father   . Stroke Father   . Diabetes Mellitus II Father     Past Surgical History:  Procedure Laterality Date  . cataract surgery Left 07-2013  . I&D EXTREMITY Bilateral 12/31/2018   Procedure: DEBRIDEMENT BILATERAL LEGS, APPLY VAC X  2;  Surgeon: [name], MD;  Location: [facility];  Service: Orthopedics;  Laterality: Bilateral;  . I&D EXTREMITY Bilateral 01/02/2019   Procedure: REPEAT DEBRIDEMENT BILATERAL LEGS, APPLY VAC X 2;  Surgeon: [name], MD;  Location: [facility];  Service: Orthopedics;  Laterality: Bilateral;  . ICD IMPLANT N/A 07/05/2017   Procedure: ICD Implant;  Surgeon: Constance Haw, MD;  Location: [facility];  Service: Cardiovascular;  Laterality: N/A;  . LEFT HEART CATH AND CORONARY ANGIOGRAPHY N/A 07/03/2017   Procedure: Left Heart Cath and Coronary Angiography;  Surgeon: Belva Crome, MD;  Location: [facility];  Service: Cardiovascular;  Laterality:  N/A;  . SKIN SPLIT GRAFT Bilateral 01/07/2019   Procedure: SPLIT THICKNESS SKIN GRAFT BILATERAL LEGS, APPLY VAC;  Surgeon: [name], MD;  Location: [facility];  Service: Orthopedics;  Laterality: Bilateral;   Social History   Occupational History  . Not on file  Tobacco Use  . Smoking status: Never Smoker  . Smokeless tobacco: Never Used  Substance and Sexual Activity  . Alcohol use: No  . Drug use: No  . Sexual activity: Not Currently

## 2019-06-04 ENCOUNTER — Encounter: Payer: Self-pay | Admitting: Orthopedic Surgery

## 2019-06-04 ENCOUNTER — Other Ambulatory Visit: Payer: Self-pay

## 2019-06-04 ENCOUNTER — Ambulatory Visit (INDEPENDENT_AMBULATORY_CARE_PROVIDER_SITE_OTHER): Payer: Medicare Other | Admitting: Physician Assistant

## 2019-06-04 VITALS — Ht 67.0 in | Wt 299.0 lb

## 2019-06-04 DIAGNOSIS — Z945 Skin transplant status: Secondary | ICD-10-CM | POA: Diagnosis not present

## 2019-06-04 DIAGNOSIS — L97929 Non-pressure chronic ulcer of unspecified part of left lower leg with unspecified severity: Secondary | ICD-10-CM

## 2019-06-04 DIAGNOSIS — Z6841 Body Mass Index (BMI) 40.0 and over, adult: Secondary | ICD-10-CM

## 2019-06-04 DIAGNOSIS — I87333 Chronic venous hypertension (idiopathic) with ulcer and inflammation of bilateral lower extremity: Secondary | ICD-10-CM | POA: Diagnosis not present

## 2019-06-04 DIAGNOSIS — N183 Chronic kidney disease, stage 3 unspecified: Secondary | ICD-10-CM

## 2019-06-04 DIAGNOSIS — L97919 Non-pressure chronic ulcer of unspecified part of right lower leg with unspecified severity: Secondary | ICD-10-CM | POA: Diagnosis not present

## 2019-06-04 NOTE — Progress Notes (Signed)
Office Visit Note   Patient: Kari [name]           Date of Birth: 04-03-1961           MRN: [id number] Visit Date: 06/04/2019              Requested by: Rutherford Guys, MD [address] [place],  [place] [address] PCP: Rutherford Guys, MD  Chief Complaint  Patient presents with  . Left Leg - Routine Post Op    01/07/19 STSG Bil Legs  . Right Leg - Routine Post Op      HPI: The patient is a 58 yo woman who is seen for follow up of bilateral lower leg ulcers.  She last underwent debridement and placement of skin grafts to the bilateral lower legs in 12/2018. She had significant worsening of the ulcers and we placed silver alginate over the ulcers and then Dynaflex multi layer compression wraps.  Her edema is improved today and the wound beds look slightly improved.  She would like to hold off on surgery for now and try another week of compression wraps and silver alginate to the wound beds.     Assessment & Plan: Visit Diagnoses:  1. Idiopathic chronic venous hypertension of both lower extremities with ulcer and inflammation ([place])   2. S/P split thickness skin graft   3. CKD (chronic kidney disease), stage III ([place])   4. BMI 40.0-44.9, adult [facility])     Plan: Dr. Sharol Given discussed that the patient would benefit from debridement in the OR and placement of grafts, but the patient would like to wait and see if things improve over the next week.  Silver alginate on the wound beds and then Dynaflex compression wraps were applied. She will follow up next Tuesday.   Follow-Up Instructions: Return in about 5 days (around 06/09/2019).   Ortho Exam  Patient is alert, oriented, no adenopathy, well-dressed, normal affect, normal respiratory effort. Bilateral lower leg edema is improved. There is slight improvement in the yellow slough/fibrinous tissue over the wound beds. There is copious drainage with odor.   Imaging: No results found.    Labs: Lab Results  Component Value Date    HGBA1C 5.6 12/17/2016   HGBA1C 5.5 03/12/2014   HGBA1C 6.0 (H) 06/21/2012   REPTSTATUS 12/30/2018 FINAL 12/25/2018   GRAMSTAIN  06/27/2017    ABUNDANT WBC PRESENT,BOTH PMN AND MONONUCLEAR RARE GRAM NEGATIVE RODS RARE GRAM NEGATIVE COCCI IN PAIRS    CULT  12/25/2018    NO GROWTH 5 DAYS Performed at [facility], [place] [name], [place], [place] [address]      Lab Results  Component Value Date   ALBUMIN 3.6 (L) 02/11/2019   ALBUMIN 2.2 (L) 12/25/2018   ALBUMIN 3.4 (L) 09/11/2018    Body mass index is 46.83 kg/m.  Orders:  No orders of the defined types were placed in this encounter.  No orders of the defined types were placed in this encounter.    Procedures: No procedures performed  Clinical Data: No additional findings.  ROS:  All other systems negative, except as noted in the HPI. Review of Systems  Objective: Vital Signs: Ht 5\' 7"  (1.702 m)   Wt 299 lb (135.6 kg)   LMP 11/21/2011   BMI 46.83 kg/m   Specialty Comments:  No specialty comments available.  PMFS History: Patient Active Problem List   Diagnosis Date Noted  . H/O skin graft 05/13/2019  . CKD (chronic kidney  disease), stage III ([place]) 01/13/2019  . Infected wound 12/26/2018  . Idiopathic chronic venous hypertension of both lower extremities with ulcer and inflammation ([place])   . History of fall 05/10/2018  . Hyperkalemia 02/21/2018  . Hypotension 02/21/2018  . Sepsis ([place]) 02/21/2018  . Encounter for central line placement   . Mitral regurgitation 10/30/2017  . Acute kidney injury ([place]) 07/21/2017  . Generalized weakness 07/20/2017  . Shock circulatory ([place])   . Acute respiratory failure with hypoxia ([place])   . Pulmonary edema   . Cardiac arrest ([place]) 06/26/2017  . Ventricular fibrillation ([place])   . Acute on chronic diastolic heart failure ([place])   . Abnormal ECG   . Cellulitis 04/02/2017  . AKI (acute kidney injury) ([place]) 12/17/2016  . Cellulitis and abscess of right lower  extremity 12/17/2016  . Syncope and collapse 12/17/2016  . Peripheral edema 12/17/2016  . Hypokalemia 12/17/2016  . Anemia of chronic disease 12/17/2016  . Acute diastolic CHF (congestive heart failure) ([place]) 03/14/2014  . CHF (congestive heart failure) ([place]) 03/12/2014  . Sarcoidosis ([place]) 03/12/2014  . Severe sepsis ([place]) 10/17/2013  . ARF (acute renal failure) ([place]) 10/08/2013  . Cellulitis of multiple sites of lower extremity 10/08/2013  . Morbid obesity ([place]) 10/08/2013  . Volume depletion 10/08/2013  . Venous (peripheral) insufficiency 10/28/2012  . Mediastinal lymphadenopathy 10/16/2012  . Pulmonary nodules 10/16/2012  . 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 Chest pain 09/02/2012  . Asthma   . Hypertension   . Depression   . Venous stasis ulcers ([place]) 07/29/2012  . Varicose veins of lower extremities with ulcer ([place]) 07/08/2012  . Venous ulcer of leg ([place]) 07/08/2012   Past Medical History:  Diagnosis Date  . Asthma   . CHF (congestive heart failure) ([place]) 03/12/2014   a. EF 40-45% by echo in 06/2017 with normal cors by cath. ICD placed following VT arrest  . Depression   . Headache(784.0)   . Hyperlipidemia   . Hypertension   . Lung nodules   . Renal disorder   . Sarcoidosis   . Ulcer    recurring, from chronic venous insufficiency    Family History  Problem Relation Age of Onset  . Heart failure Mother   . Diabetes Mellitus II Mother   . Hypertension Mother   . Cancer Mother        unknown type  . Heart disease Father   . Stroke Father   . Diabetes Mellitus II Father     Past Surgical History:  Procedure Laterality Date  . cataract surgery Left 07-2013  . I&D EXTREMITY Bilateral 12/31/2018   Procedure: DEBRIDEMENT BILATERAL LEGS, APPLY VAC X 2;  Surgeon: [name], MD;  Location: [facility];  Service: Orthopedics;  Laterality: Bilateral;  . I&D EXTREMITY Bilateral 01/02/2019   Procedure: REPEAT  DEBRIDEMENT BILATERAL LEGS, APPLY VAC X 2;  Surgeon: [name], MD;  Location: [facility];  Service: Orthopedics;  Laterality: Bilateral;  . ICD IMPLANT N/A 07/05/2017   Procedure: ICD Implant;  Surgeon: Constance Haw, MD;  Location: [facility];  Service: Cardiovascular;  Laterality: N/A;  . LEFT HEART CATH AND CORONARY ANGIOGRAPHY N/A 07/03/2017   Procedure: Left Heart Cath and Coronary Angiography;  Surgeon: Belva Crome, MD;  Location: [facility];  Service: Cardiovascular;  Laterality: N/A;  . SKIN SPLIT GRAFT Bilateral 01/07/2019   Procedure: SPLIT THICKNESS SKIN GRAFT BILATERAL LEGS, APPLY VAC;  Surgeon: Sharol Given,  Illene Regulus, MD;  Location: [facility];  Service: Orthopedics;  Laterality: Bilateral;   Social History   Occupational History  . Not on file  Tobacco Use  . Smoking status: Never Smoker  . Smokeless tobacco: Never Used  Substance and Sexual Activity  . Alcohol use: No  . Drug use: No  . Sexual activity: Not Currently

## 2019-06-05 ENCOUNTER — Inpatient Hospital Stay: Admit: 2019-06-05 | Payer: Medicare Other | Admitting: Orthopedic Surgery

## 2019-06-05 SURGERY — IRRIGATION AND DEBRIDEMENT EXTREMITY
Anesthesia: General | Laterality: Bilateral

## 2019-06-09 ENCOUNTER — Ambulatory Visit (INDEPENDENT_AMBULATORY_CARE_PROVIDER_SITE_OTHER): Payer: Medicare Other | Admitting: *Deleted

## 2019-06-09 DIAGNOSIS — I428 Other cardiomyopathies: Secondary | ICD-10-CM | POA: Diagnosis not present

## 2019-06-10 ENCOUNTER — Encounter: Payer: Self-pay | Admitting: Orthopedic Surgery

## 2019-06-10 ENCOUNTER — Other Ambulatory Visit: Payer: Self-pay

## 2019-06-10 ENCOUNTER — Telehealth: Payer: Self-pay

## 2019-06-10 ENCOUNTER — Ambulatory Visit (INDEPENDENT_AMBULATORY_CARE_PROVIDER_SITE_OTHER): Payer: Medicare Other | Admitting: Orthopedic Surgery

## 2019-06-10 VITALS — Ht 67.0 in | Wt 299.0 lb

## 2019-06-10 DIAGNOSIS — Z945 Skin transplant status: Secondary | ICD-10-CM | POA: Diagnosis not present

## 2019-06-10 DIAGNOSIS — L97209 Non-pressure chronic ulcer of unspecified calf with unspecified severity: Secondary | ICD-10-CM | POA: Diagnosis not present

## 2019-06-10 DIAGNOSIS — I83002 Varicose veins of unspecified lower extremity with ulcer of calf: Secondary | ICD-10-CM

## 2019-06-10 NOTE — Telephone Encounter (Signed)
Left message for patient to remind of missed remote transmission.  

## 2019-06-10 NOTE — Progress Notes (Signed)
Office Visit Note   Patient: Kari [name]           Date of Birth: 02/15/1961           MRN: [id number] Visit Date: 06/10/2019              Requested by: Rutherford Guys, MD [address] [place],  [place] [address] PCP: Rutherford Guys, MD  Chief Complaint  Patient presents with  . Right Leg - Follow-up  . Left Leg - Follow-up      HPI: The patient is a 58 yo woman who is seen for follow up of bilateral lower leg ulcers. She last underwent debridement and placement of skin grafts to the bilateral lower legs in 12/2018. She had significant worsening of the ulcers and we placed silver alginate over the ulcers and then Dynaflex multi layer compression wraps.  Her edema is improved today and the wound beds continue to look mildly improved.   She would like to continue to hold off on surgery will reconsider after fathers day and try another week of compression wraps and silver alginate to the wound beds.     Assessment & Plan: Visit Diagnoses:  No diagnosis found.  Plan:  Silver alginate on the wound beds and then Dynaflex compression wraps were applied. She will follow up again next Tuesday.   Follow-Up Instructions: No follow-ups on file.   Ortho Exam  Patient is alert, oriented, no adenopathy, well-dressed, normal affect, normal respiratory effort. Bilateral lower leg edema is improved. There is slight improvement in the yellow slough/fibrinous tissue over the wound beds. There is minimal serous drainage. No odor. No erythema. No warmth.   Imaging: No results found.    Labs: Lab Results  Component Value Date   HGBA1C 5.6 12/17/2016   HGBA1C 5.5 03/12/2014   HGBA1C 6.0 (H) 06/21/2012   REPTSTATUS 12/30/2018 FINAL 12/25/2018   GRAMSTAIN  06/27/2017    ABUNDANT WBC PRESENT,BOTH PMN AND MONONUCLEAR RARE GRAM NEGATIVE RODS RARE GRAM NEGATIVE COCCI IN PAIRS    CULT  12/25/2018    NO GROWTH 5 DAYS Performed at [facility], [place] [address],  [place], [place] [address]      Lab Results  Component Value Date   ALBUMIN 3.6 (L) 02/11/2019   ALBUMIN 2.2 (L) 12/25/2018   ALBUMIN 3.4 (L) 09/11/2018    Body mass index is 46.83 kg/m.  Orders:  No orders of the defined types were placed in this encounter.  No orders of the defined types were placed in this encounter.    Procedures: No procedures performed  Clinical Data: No additional findings.  ROS:  All other systems negative, except as noted in the HPI. Review of Systems  Constitutional: Negative for chills and fever.  Cardiovascular: Positive for leg swelling.  Skin: Positive for wound. Negative for color change.    Objective: Vital Signs: Ht 5\' 7"  (1.702 m)   Wt 299 lb (135.6 kg)   LMP 11/21/2011   BMI 46.83 kg/m   Specialty Comments:  No specialty comments available.  PMFS History: Patient Active Problem List   Diagnosis Date Noted  . H/O skin graft 05/13/2019  . CKD (chronic kidney disease), stage III ([place]) 01/13/2019  . Infected wound 12/26/2018  . Idiopathic chronic venous hypertension of both lower extremities with ulcer and inflammation ([place])   . History of fall 05/10/2018  . Hyperkalemia 02/21/2018  . Hypotension 02/21/2018  . Sepsis ([place]) 02/21/2018  . Encounter for  central line placement   . Mitral regurgitation 10/30/2017  . Acute kidney injury ([place]) 07/21/2017  . Generalized weakness 07/20/2017  . Shock circulatory ([place])   . Acute respiratory failure with hypoxia ([place])   . Pulmonary edema   . Cardiac arrest ([place]) 06/26/2017  . Ventricular fibrillation ([place])   . Acute on chronic diastolic heart failure ([place])   . Abnormal ECG   . Cellulitis 04/02/2017  . AKI (acute kidney injury) ([place]) 12/17/2016  . Cellulitis and abscess of right lower extremity 12/17/2016  . Syncope and collapse 12/17/2016  . Peripheral edema 12/17/2016  . Hypokalemia 12/17/2016  . Anemia of chronic disease 12/17/2016  . Acute diastolic CHF (congestive heart  failure) ([place]) 03/14/2014  . CHF (congestive heart failure) ([place]) 03/12/2014  . Sarcoidosis ([place]) 03/12/2014  . Severe sepsis ([place]) 10/17/2013  . ARF (acute renal failure) ([place]) 10/08/2013  . Cellulitis of multiple sites of lower extremity 10/08/2013  . Morbid obesity ([place]) 10/08/2013  . Volume depletion 10/08/2013  . Venous (peripheral) insufficiency 10/28/2012  . Mediastinal lymphadenopathy 10/16/2012  . Pulmonary nodules 10/16/2012  . 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 Chest pain 09/02/2012  . Asthma   . Hypertension   . Depression   . Venous stasis ulcers ([place]) 07/29/2012  . 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 Venous ulcer of leg ([place]) 07/08/2012   Past Medical History:  Diagnosis Date  . Asthma   . CHF (congestive heart failure) ([place]) 03/12/2014   a. EF 40-45% by echo in 06/2017 with normal cors by cath. ICD placed following VT arrest  . Depression   . Headache(784.0)   . Hyperlipidemia   . Hypertension   . Lung nodules   . Renal disorder   . Sarcoidosis   . Ulcer    recurring, from chronic venous insufficiency    Family History  Problem Relation Age of Onset  . Heart failure Mother   . Diabetes Mellitus II Mother   . Hypertension Mother   . Cancer Mother        unknown type  . Heart disease Father   . Stroke Father   . Diabetes Mellitus II Father     Past Surgical History:  Procedure Laterality Date  . cataract surgery Left 07-2013  . I&D EXTREMITY Bilateral 12/31/2018   Procedure: DEBRIDEMENT BILATERAL LEGS, APPLY VAC X 2;  Surgeon: [name], MD;  Location: [facility];  Service: Orthopedics;  Laterality: Bilateral;  . I&D EXTREMITY Bilateral 01/02/2019   Procedure: REPEAT DEBRIDEMENT BILATERAL LEGS, APPLY VAC X 2;  Surgeon: [name], MD;  Location: [facility];  Service: Orthopedics;  Laterality: Bilateral;  . ICD IMPLANT N/A 07/05/2017   Procedure: ICD Implant;  Surgeon:  Constance Haw, MD;  Location: [facility];  Service: Cardiovascular;  Laterality: N/A;  . LEFT HEART CATH AND CORONARY ANGIOGRAPHY N/A 07/03/2017   Procedure: Left Heart Cath and Coronary Angiography;  Surgeon: Belva Crome, MD;  Location: [facility];  Service: Cardiovascular;  Laterality: N/A;  . SKIN SPLIT GRAFT Bilateral 01/07/2019   Procedure: SPLIT THICKNESS SKIN GRAFT BILATERAL LEGS, APPLY VAC;  Surgeon: [name], MD;  Location: [facility];  Service: Orthopedics;  Laterality: Bilateral;   Social History   Occupational History  . Not on file  Tobacco Use  . Smoking status: Never Smoker  . Smokeless tobacco: Never Used  Substance and Sexual Activity  . Alcohol  use: No  . Drug use: No  . Sexual activity: Not Currently

## 2019-06-13 LAB — CUP PACEART REMOTE DEVICE CHECK
Battery Remaining Longevity: 73 mo
Battery Remaining Percentage: 72 %
Battery Voltage: 2.98 V
Brady Statistic RV Percent Paced: 1 %
Date Time Interrogation Session: 20200619154842
HighPow Impedance: 69 Ohm
HighPow Impedance: 69 Ohm
Implantable Lead Implant Date: 20180713
Implantable Lead Location: 753860
Implantable Pulse Generator Implant Date: 20180713
Lead Channel Impedance Value: 380 Ohm
Lead Channel Pacing Threshold Amplitude: 0.5 V
Lead Channel Pacing Threshold Pulse Width: 0.5 ms
Lead Channel Sensing Intrinsic Amplitude: 7.6 mV
Lead Channel Setting Pacing Amplitude: 2.5 V
Lead Channel Setting Pacing Pulse Width: 0.5 ms
Lead Channel Setting Sensing Sensitivity: 0.5 mV
Pulse Gen Serial Number: 7302172

## 2019-06-18 ENCOUNTER — Encounter: Payer: Self-pay | Admitting: Orthopedic Surgery

## 2019-06-18 ENCOUNTER — Ambulatory Visit (INDEPENDENT_AMBULATORY_CARE_PROVIDER_SITE_OTHER): Payer: Medicare Other | Admitting: Physician Assistant

## 2019-06-18 ENCOUNTER — Other Ambulatory Visit: Payer: Self-pay

## 2019-06-18 ENCOUNTER — Other Ambulatory Visit: Payer: Self-pay | Admitting: Family Medicine

## 2019-06-18 VITALS — Ht 67.0 in | Wt 299.0 lb

## 2019-06-18 DIAGNOSIS — L97209 Non-pressure chronic ulcer of unspecified calf with unspecified severity: Secondary | ICD-10-CM | POA: Diagnosis not present

## 2019-06-18 DIAGNOSIS — Z945 Skin transplant status: Secondary | ICD-10-CM | POA: Diagnosis not present

## 2019-06-18 DIAGNOSIS — I87333 Chronic venous hypertension (idiopathic) with ulcer and inflammation of bilateral lower extremity: Secondary | ICD-10-CM

## 2019-06-18 DIAGNOSIS — L97919 Non-pressure chronic ulcer of unspecified part of right lower leg with unspecified severity: Secondary | ICD-10-CM | POA: Diagnosis not present

## 2019-06-18 DIAGNOSIS — L97929 Non-pressure chronic ulcer of unspecified part of left lower leg with unspecified severity: Secondary | ICD-10-CM

## 2019-06-18 DIAGNOSIS — N183 Chronic kidney disease, stage 3 unspecified: Secondary | ICD-10-CM

## 2019-06-18 DIAGNOSIS — Z6841 Body Mass Index (BMI) 40.0 and over, adult: Secondary | ICD-10-CM | POA: Diagnosis not present

## 2019-06-18 DIAGNOSIS — I83002 Varicose veins of unspecified lower extremity with ulcer of calf: Secondary | ICD-10-CM | POA: Diagnosis not present

## 2019-06-18 NOTE — Telephone Encounter (Signed)
Please advise 

## 2019-06-18 NOTE — Progress Notes (Addendum)
Office Visit Note   Patient: Kari [name]           Date of Birth: [birth year]           MRN: [id number] Visit Date: 06/18/2019              Requested by: Rutherford Guys, MD [address] [place],  [place] [address] PCP: Rutherford Guys, MD  Chief Complaint  Patient presents with  . Left Leg - Follow-up  . Right Leg - Follow-up      HPI: The patient is a 58 year old woman who is seen for follow-up of her bilateral lower leg ulcers.  She is undergone serial debridement and placement of skin grafts with her last surgery being in January 2020.  She has been utilizing some silver alginate over the ulcers and then Dynaflex multilayer compression wraps and her edema is improved and the overall wound appearance is improved.  She would like to continue nonsurgical wound care at this point for at least a couple more weeks.  Assessment & Plan: Visit Diagnoses:  1. H/O skin graft   2. Venous stasis ulcer of calf, unspecified laterality, unspecified ulcer stage, unspecified whether varicose veins present ([place])   3. Idiopathic chronic venous hypertension of both lower extremities with ulcer and inflammation ([place])   4. CKD (chronic kidney disease), stage III ([place])   5. BMI 45.0-49.9, adult (HCC)     Plan:Continue silver alginate to bilateral lower leg wounds and application of Dynaflex wraps.  We discussed that she may still require operative debridement and placement of more graft. She would like to continue with dressing changes for now.  Sent prescription for hydrocodoneAPAP 5/325 mg #10 for judicious use when in more pain.  She will follow up in 1 week.   Follow-Up Instructions: Return in about 1 week (around 06/25/2019).   Ortho Exam  Patient is alert, oriented, no adenopathy, well-dressed, normal affect, normal respiratory effort.  Bilateral lower leg edema seems slightly worsened today.  She does report she has been up and around more due to a death of a friend of the family.   Her wound beds have a large amount of fibrinous yellow adherent tissue over a pale pink wound bed but she does not tolerate much debridement.  She does have pain with the dressing change.  She has palpable pedal pulses.  There are no signs of periwound breakdown and there is no signs of cellulitis of the lower extremities.  Imaging: No results found.    Labs: Lab Results  Component Value Date   HGBA1C 5.6 12/17/2016   HGBA1C 5.5 03/12/2014   HGBA1C 6.0 (H) 06/21/2012   REPTSTATUS 12/30/2018 FINAL 12/25/2018   GRAMSTAIN  06/27/2017    ABUNDANT WBC PRESENT,BOTH PMN AND MONONUCLEAR RARE GRAM NEGATIVE RODS RARE GRAM NEGATIVE COCCI IN PAIRS    CULT  12/25/2018    NO GROWTH 5 DAYS Performed at [facility], [place] [address] [place], [place] [address]      Lab Results  Component Value Date   ALBUMIN 3.6 (L) 02/11/2019   ALBUMIN 2.2 (L) 12/25/2018   ALBUMIN 3.4 (L) 09/11/2018    Body mass index is 46.83 kg/m.  Orders:  No orders of the defined types were placed in this encounter.  No orders of the defined types were placed in this encounter.    Procedures: No procedures performed  Clinical Data: No additional findings.  ROS:  All other systems negative, except as noted  in the HPI. Review of Systems  Objective: Vital Signs: Ht 5\' 7"  (1.702 m)   Wt 299 lb (135.6 kg)   LMP 11/21/2011   BMI 46.83 kg/m   Specialty Comments:  No specialty comments available.  PMFS History: Patient Active Problem List   Diagnosis Date Noted  . H/O skin graft 05/13/2019  . CKD (chronic kidney disease), stage III ([place]) 01/13/2019  . Infected wound 12/26/2018  . Idiopathic chronic venous hypertension of both lower extremities with ulcer and inflammation ([place])   . History of fall 05/10/2018  . Hyperkalemia 02/21/2018  . Hypotension 02/21/2018  . Sepsis ([place]) 02/21/2018  . Encounter for central line placement   . Mitral regurgitation 10/30/2017  . Acute kidney injury ([place])  07/21/2017  . Generalized weakness 07/20/2017  . Shock circulatory ([place])   . Acute respiratory failure with hypoxia ([place])   . Pulmonary edema   . Cardiac arrest ([place]) 06/26/2017  . Ventricular fibrillation ([place])   . Acute on chronic diastolic heart failure ([place])   . Abnormal ECG   . Cellulitis 04/02/2017  . AKI (acute kidney injury) ([place]) 12/17/2016  . Cellulitis and abscess of right lower extremity 12/17/2016  . Syncope and collapse 12/17/2016  . Peripheral edema 12/17/2016  . Hypokalemia 12/17/2016  . Anemia of chronic disease 12/17/2016  . Acute diastolic CHF (congestive heart failure) ([place]) 03/14/2014  . CHF (congestive heart failure) ([place]) 03/12/2014  . Sarcoidosis ([place]) 03/12/2014  . Severe sepsis ([employer]) 10/17/2013  . ARF (acute renal failure) ([place]) 10/08/2013  . Cellulitis of multiple sites of lower extremity 10/08/2013  . Morbid obesity ([place]) 10/08/2013  . Volume depletion 10/08/2013  . Venous (peripheral) insufficiency 10/28/2012  . Mediastinal lymphadenopathy 10/16/2012  . Pulmonary nodules 10/16/2012  . 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 Chest pain 09/02/2012  . Asthma   . Hypertension   . Depression   . Venous stasis ulcers ([place]) 07/29/2012  . Varicose veins of lower extremities with ulcer ([place]) 07/08/2012  . Venous ulcer of leg ([place]) 07/08/2012   Past Medical History:  Diagnosis Date  . Asthma   . CHF (congestive heart failure) ([place]rom[place]) 03/12/2014   a. EF 40-45% by echo in 06/2017 with normal cors by cath. ICD placed following VT arrest  . Depression   . Headache(784.0)   . Hyperlipidemia   . Hypertension   . Lung nodules   . Renal disorder   . Sarcoidosis   . Ulcer    recurring, from chronic venous insufficiency    Family History  Problem Relation Age of Onset  . Heart failure Mother   . Diabetes Mellitus II Mother   . Hypertension Mother   . Cancer Mother        unknown type  . Heart disease  Father   . Stroke Father   . Diabetes Mellitus II Father     Past Surgical History:  Procedure Laterality Date  . cataract surgery Left 07-2013  . I&D EXTREMITY Bilateral 12/31/2018   Procedure: DEBRIDEMENT BILATERAL LEGS, APPLY VAC X 2;  Surgeon: [name], MD;  Location: [facility];  Service: Orthopedics;  Laterality: Bilateral;  . I&D EXTREMITY Bilateral 01/02/2019   Procedure: REPEAT DEBRIDEMENT BILATERAL LEGS, APPLY VAC X 2;  Surgeon: [name], MD;  Location: [facility];  Service: Orthopedics;  Laterality: Bilateral;  . ICD IMPLANT N/A 07/05/2017   Procedure: ICD Implant;  Surgeon: Constance Haw, MD;  Location: [facility];  Service: Cardiovascular;  Laterality: N/A;  . LEFT HEART CATH AND CORONARY ANGIOGRAPHY N/A 07/03/2017   Procedure: Left Heart Cath and Coronary Angiography;  Surgeon: Belva Crome, MD;  Location: [facility];  Service: Cardiovascular;  Laterality: N/A;  . SKIN SPLIT GRAFT Bilateral 01/07/2019   Procedure: SPLIT THICKNESS SKIN GRAFT BILATERAL LEGS, APPLY VAC;  Surgeon: [name], MD;  Location: [facility];  Service: Orthopedics;  Laterality: Bilateral;   Social History   Occupational History  . Not on file  Tobacco Use  . Smoking status: Never Smoker  . Smokeless tobacco: Never Used  Substance and Sexual Activity  . Alcohol use: No  . Drug use: No  . Sexual activity: Not Currently

## 2019-06-19 ENCOUNTER — Telehealth: Payer: Self-pay | Admitting: Physician Assistant

## 2019-06-19 ENCOUNTER — Encounter: Payer: Self-pay | Admitting: Cardiology

## 2019-06-19 MED ORDER — HYDROCODONE-ACETAMINOPHEN 5-325 MG PO TABS: 1.0000 | ORAL_TABLET | Freq: Two times a day (BID) | ORAL | 0 refills | Status: DC | PRN

## 2019-06-19 NOTE — Progress Notes (Signed)
Remote ICD transmission.   

## 2019-06-19 NOTE — Addendum Note (Signed)
Addended by: Milas Gain on: 06/19/2019 10:37 AM   Modules accepted: Orders

## 2019-06-19 NOTE — Telephone Encounter (Signed)
Sent in very small Rx for Norco 5/325 mg q 12 hours prn for pain #10  Thank you

## 2019-06-19 NOTE — Telephone Encounter (Signed)
Please see message below and advise.

## 2019-06-19 NOTE — Telephone Encounter (Signed)
I called and advised pt of message below. Will cal with any other questions.

## 2019-06-19 NOTE — Telephone Encounter (Signed)
Patient called stated that she was told by Shawn @ appt 06/18/19 that she would call in pain meds for patient(no name given) Was supposed to call to Rafael Hernandez.  [phone]

## 2019-06-23 ENCOUNTER — Ambulatory Visit: Payer: Medicare Other | Admitting: Physician Assistant

## 2019-06-23 ENCOUNTER — Other Ambulatory Visit: Payer: Self-pay

## 2019-06-23 ENCOUNTER — Ambulatory Visit (INDEPENDENT_AMBULATORY_CARE_PROVIDER_SITE_OTHER): Payer: Medicare Other | Admitting: Family

## 2019-06-23 ENCOUNTER — Encounter: Payer: Self-pay | Admitting: Family

## 2019-06-23 VITALS — Ht 67.0 in | Wt 299.0 lb

## 2019-06-23 DIAGNOSIS — Z945 Skin transplant status: Secondary | ICD-10-CM

## 2019-06-23 DIAGNOSIS — I83002 Varicose veins of unspecified lower extremity with ulcer of calf: Secondary | ICD-10-CM

## 2019-06-23 DIAGNOSIS — L97209 Non-pressure chronic ulcer of unspecified calf with unspecified severity: Secondary | ICD-10-CM | POA: Diagnosis not present

## 2019-06-23 NOTE — Progress Notes (Signed)
Office Visit Note   Patient: Kari [name]           Date of Birth: 1961-10-26           MRN: [id number] Visit Date: 06/23/2019              Requested by: Rutherford Guys, MD [address] [place],  [place] [address] PCP: Rutherford Guys, MD  Chief Complaint  Patient presents with  . Right Leg - Wound Check  . Left Leg - Wound Check      HPI: The patient is a 58 year old woman seen today in follow-up for bilateral lower extremity venous ulcerations.  She has undergone serial debridement with placement of skin graft in the past.  Last surgery was January of this year.  Most recently we have been applying silver alginate to the wounds with Dynaflex wraps.  Patient has been satisfied with wound care to this point.  She has been up on her feet much more frequently to attend funerals recently.  In good spirits today.  Assessment & Plan: Visit Diagnoses:  No diagnosis found.  Plan: Reapplied Dynaflex wraps to bilateral lower extremities.  Patient to follow-up in office in 1 week for reapplication.  Patient in agreement with repeat operative debridement and placement of skin grafting.  We will get this set up.  Cheryl to call her to schedule.   Follow-Up Instructions: Return in about 1 week (around 06/30/2019).   Ortho Exam  Patient is alert, oriented, no adenopathy, well-dressed, normal affect, normal respiratory effort.  Bilateral lower extremity edema which is moderate today.  She has significant fibrinous yellow tissue in the wound beds.  No granulation seen.  She does not have any surrounding wound maceration.  No foul odor.  No erythema no warmth no sign of infection.  Does have palpable pedal pulses.   Imaging: No results found.    Labs: Lab Results  Component Value Date   HGBA1C 5.6 12/17/2016   HGBA1C 5.5 03/12/2014   HGBA1C 6.0 (H) 06/21/2012   REPTSTATUS 12/30/2018 FINAL 12/25/2018   GRAMSTAIN  06/27/2017    ABUNDANT WBC PRESENT,BOTH PMN AND MONONUCLEAR RARE  GRAM NEGATIVE RODS RARE GRAM NEGATIVE COCCI IN PAIRS    CULT  12/25/2018    NO GROWTH 5 DAYS Performed at [facility], [place] [address], [place], [place] [address]      Lab Results  Component Value Date   ALBUMIN 3.6 (L) 02/11/2019   ALBUMIN 2.2 (L) 12/25/2018   ALBUMIN 3.4 (L) 09/11/2018    Body mass index is 46.83 kg/m.  Orders:  No orders of the defined types were placed in this encounter.  No orders of the defined types were placed in this encounter.    Procedures: No procedures performed  Clinical Data: No additional findings.  ROS:  All other systems negative, except as noted in the HPI. Review of Systems  Constitutional: Negative for chills and fever.  Skin: Positive for wound.    Objective: Vital Signs: Ht 5\' 7"  (1.702 m)   Wt 299 lb (135.6 kg)   LMP 11/21/2011   BMI 46.83 kg/m   Specialty Comments:  No specialty comments available.  PMFS History: Patient Active Problem List   Diagnosis Date Noted  . H/O skin graft 05/13/2019  . CKD (chronic kidney disease), stage III ([place]) 01/13/2019  . Infected wound 12/26/2018  . Idiopathic chronic venous hypertension of both lower extremities with ulcer and inflammation ([place])   . History  of fall 05/10/2018  . Hyperkalemia 02/21/2018  . Hypotension 02/21/2018  . Sepsis ([name]) 02/21/2018  . Encounter for central line placement   . Mitral regurgitation 10/30/2017  . Acute kidney injury ([place]) 07/21/2017  . Generalized weakness 07/20/2017  . Shock circulatory ([place])   . Acute respiratory failure with hypoxia ([place])   . Pulmonary edema   . Cardiac arrest ([place]) 06/26/2017  . Ventricular fibrillation ([place])   . Acute on chronic diastolic heart failure ([place])   . Abnormal ECG   . Cellulitis 04/02/2017  . AKI (acute kidney injury) ([place]) 12/17/2016  . Cellulitis and abscess of right lower extremity 12/17/2016  . Syncope and collapse 12/17/2016  . Peripheral edema 12/17/2016  . Hypokalemia 12/17/2016  .  Anemia of chronic disease 12/17/2016  . Acute diastolic CHF (congestive heart failure) ([place]) 03/14/2014  . CHF (congestive heart failure) ([place]risonville) 03/12/2014  . Sarcoidosis ([place]) 03/12/2014  . Severe sepsis ([place]) 10/17/2013  . ARF (acute renal failure) ([place]) 10/08/2013  . Cellulitis of multiple sites of lower extremity 10/08/2013  . Morbid obesity ([place]) 10/08/2013  . Volume depletion 10/08/2013  . Venous (peripheral) insufficiency 10/28/2012  . Mediastinal lymphadenopathy 10/16/2012  . Pulmonary nodules 10/16/2012  . 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 Chest pain 09/02/2012  . Asthma   . Hypertension   . Depression   . Venous stasis ulcers ([place]) 07/29/2012  . Varicose veins of lower extremities with ulcer ([place]) 07/08/2012  . Venous ulcer of leg ([place]) 07/08/2012   Past Medical History:  Diagnosis Date  . Asthma   . CHF (congestive heart failure) (Clar[place]) 03/12/2014   a. EF 40-45% by echo in 06/2017 with normal cors by cath. ICD placed following VT arrest  . Depression   . Headache(784.0)   . Hyperlipidemia   . Hypertension   . Lung nodules   . Renal disorder   . Sarcoidosis   . Ulcer    recurring, from chronic venous insufficiency    Family History  Problem Relation Age of Onset  . Heart failure Mother   . Diabetes Mellitus II Mother   . Hypertension Mother   . Cancer Mother        unknown type  . Heart disease Father   . Stroke Father   . Diabetes Mellitus II Father     Past Surgical History:  Procedure Laterality Date  . cataract surgery Left 07-2013  . I&D EXTREMITY Bilateral 12/31/2018   Procedure: DEBRIDEMENT BILATERAL LEGS, APPLY VAC X 2;  Surgeon: [name], MD;  Location: [facility];  Service: Orthopedics;  Laterality: Bilateral;  . I&D EXTREMITY Bilateral 01/02/2019   Procedure: REPEAT DEBRIDEMENT BILATERAL LEGS, APPLY VAC X 2;  Surgeon: [name], MD;  Location: [facility];  Service: Orthopedics;  Laterality:  Bilateral;  . ICD IMPLANT N/A 07/05/2017   Procedure: ICD Implant;  Surgeon: Constance Haw, MD;  Location: [facility];  Service: Cardiovascular;  Laterality: N/A;  . LEFT HEART CATH AND CORONARY ANGIOGRAPHY N/A 07/03/2017   Procedure: Left Heart Cath and Coronary Angiography;  Surgeon: Belva Crome, MD;  Location: [facility];  Service: Cardiovascular;  Laterality: N/A;  . SKIN SPLIT GRAFT Bilateral 01/07/2019   Procedure: SPLIT THICKNESS SKIN GRAFT BILATERAL LEGS, APPLY VAC;  Surgeon: [name], MD;  Location: [facility];  Service: Orthopedics;  Laterality: Bilateral;   Social History   Occupational History  . Not on file  Tobacco Use  .  Smoking status: Never Smoker  . Smokeless tobacco: Never Used  Substance and Sexual Activity  . Alcohol use: No  . Drug use: No  . Sexual activity: Not Currently

## 2019-06-30 ENCOUNTER — Encounter: Payer: Self-pay | Admitting: Family

## 2019-06-30 ENCOUNTER — Ambulatory Visit (INDEPENDENT_AMBULATORY_CARE_PROVIDER_SITE_OTHER): Payer: Medicare Other | Admitting: Family

## 2019-06-30 ENCOUNTER — Other Ambulatory Visit: Payer: Self-pay

## 2019-06-30 VITALS — Ht 67.0 in | Wt 299.0 lb

## 2019-06-30 DIAGNOSIS — L97919 Non-pressure chronic ulcer of unspecified part of right lower leg with unspecified severity: Secondary | ICD-10-CM

## 2019-06-30 DIAGNOSIS — L97209 Non-pressure chronic ulcer of unspecified calf with unspecified severity: Secondary | ICD-10-CM

## 2019-06-30 DIAGNOSIS — I87333 Chronic venous hypertension (idiopathic) with ulcer and inflammation of bilateral lower extremity: Secondary | ICD-10-CM

## 2019-06-30 DIAGNOSIS — Z945 Skin transplant status: Secondary | ICD-10-CM

## 2019-06-30 DIAGNOSIS — L97929 Non-pressure chronic ulcer of unspecified part of left lower leg with unspecified severity: Secondary | ICD-10-CM | POA: Diagnosis not present

## 2019-06-30 DIAGNOSIS — I83002 Varicose veins of unspecified lower extremity with ulcer of calf: Secondary | ICD-10-CM

## 2019-06-30 MED ORDER — HYDROCODONE-ACETAMINOPHEN 5-325 MG PO TABS: 1.0000 | ORAL_TABLET | Freq: Two times a day (BID) | ORAL | 0 refills | Status: DC | PRN

## 2019-06-30 NOTE — Addendum Note (Signed)
Addended by: Dondra Prader R on: 06/30/2019 10:09 AM   Modules accepted: Orders

## 2019-06-30 NOTE — Progress Notes (Signed)
Office Visit Note   Patient: Kari [name]           Date of Birth: 05-Mar-1961           MRN: [id number] Visit Date: 06/30/2019              Requested by: Rutherford Guys, MD [address] [place],  [place] [address] PCP: Rutherford Guys, MD  Chief Complaint  Patient presents with  . Left Leg - Wound Check  . Right Leg - Wound Check      HPI: The patient is a 58 year old woman seen today in routine follow-up for bilateral lower extremity venous ulcerations.  She is status post serial debridements with skin grafts in the past.  Last skin grafting was in January of this year.  We have been applying silver cell with Dynaflex wraps.  She is planning for surgery in about 3 weeks to debride and reapply skin graft material.  She has removed her wraps to should bathe prior to this visit.     Assessment & Plan: Visit Diagnoses:  1. Idiopathic chronic venous hypertension of both lower extremities with ulcer and inflammation ([place])   2. H/O skin graft     Plan: Today we will reapply Dynaflex wraps over bilateral lower extremities.  She will continue elevating and follow-up in 1 more week for reevaluation and reapplication of her compression dressings.  Is set up for surgery in 3 weeks.    Follow-Up Instructions: Return in about 1 week (around 07/07/2019).   Ortho Exam  Patient is alert, oriented, no adenopathy, well-dressed, normal affect, normal respiratory effort.  On examination of bilateral lower extremities.  She does have moderate edema.  Has about 20% fibrinous tissue in her wound beds there is 50% granulation.  There is no surrounding maceration no odor no active drainage no warmth she does have palpable dorsal Salas pedis pulses bilaterally.    Imaging: No results found.    Labs: Lab Results  Component Value Date   HGBA1C 5.6 12/17/2016   HGBA1C 5.5 03/12/2014   HGBA1C 6.0 (H) 06/21/2012   REPTSTATUS 12/30/2018 FINAL 12/25/2018   GRAMSTAIN  06/27/2017    ABUNDANT WBC  PRESENT,BOTH PMN AND MONONUCLEAR RARE GRAM NEGATIVE RODS RARE GRAM NEGATIVE COCCI IN PAIRS    CULT  12/25/2018    NO GROWTH 5 DAYS Performed at [facility], [place] [address] [place], [place] [address]      Lab Results  Component Value Date   ALBUMIN 3.6 (L) 02/11/2019   ALBUMIN 2.2 (L) 12/25/2018   ALBUMIN 3.4 (L) 09/11/2018    Body mass index is 46.83 kg/m.  Orders:  No orders of the defined types were placed in this encounter.  No orders of the defined types were placed in this encounter.    Procedures: No procedures performed  Clinical Data: No additional findings.  ROS:  All other systems negative, except as noted in the HPI. Review of Systems  Constitutional: Negative for chills and fever.  Skin: Positive for wound.    Objective: Vital Signs: Ht 5\' 7"  (1.702 m)   Wt 299 lb (135.6 kg)   LMP 11/21/2011   BMI 46.83 kg/m   Specialty Comments:  No specialty comments available.  PMFS History: Patient Active Problem List   Diagnosis Date Noted  . H/O skin graft 05/13/2019  . CKD (chronic kidney disease), stage III ([name]) 01/13/2019  . Infected wound 12/26/2018  . Idiopathic chronic venous hypertension of both  lower extremities with ulcer and inflammation ([place])   . History of fall 05/10/2018  . Hyperkalemia 02/21/2018  . Hypotension 02/21/2018  . Sepsis ([place]) 02/21/2018  . Encounter for central line placement   . Mitral regurgitation 10/30/2017  . Acute kidney injury ([place]) 07/21/2017  . Generalized weakness 07/20/2017  . Shock circulatory ([place])   . Acute respiratory failure with hypoxia ([place])   . Pulmonary edema   . Cardiac arrest ([place]) 06/26/2017  . Ventricular fibrillation ([place])   . Acute on chronic diastolic heart failure ([place])   . Abnormal ECG   . Cellulitis 04/02/2017  . AKI (acute kidney injury) ([place]) 12/17/2016  . Cellulitis and abscess of right lower extremity 12/17/2016  . Syncope and collapse 12/17/2016  . Peripheral edema  12/17/2016  . Hypokalemia 12/17/2016  . Anemia of chronic disease 12/17/2016  . Acute diastolic CHF (congestive heart failure) ([place]) 03/14/2014  . CHF (congestive heart failure) ([place]) 03/12/2014  . Sarcoidosis ([place]) 03/12/2014  . Severe sepsis ([place]) 10/17/2013  . ARF (acute renal failure) ([place]) 10/08/2013  . Cellulitis of multiple sites of lower extremity 10/08/2013  . Morbid obesity ([place]) 10/08/2013  . Volume depletion 10/08/2013  . Venous (peripheral) insufficiency 10/28/2012  . Mediastinal lymphadenopathy 10/16/2012  . Pulmonary nodules 10/16/2012  . 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 Chest pain 09/02/2012  . Asthma   . Hypertension   . Depression   . Venous stasis ulcers ([place]) 07/29/2012  . Varicose veins of lower extremities with ulcer ([place]) 07/08/2012  . Venous ulcer of leg ([place]) 07/08/2012   Past Medical History:  Diagnosis Date  . Asthma   . CHF (congestive heart failure) ([place]) 03/12/2014   a. EF 40-45% by echo in 06/2017 with normal cors by cath. ICD placed following VT arrest  . Depression   . Headache(784.0)   . Hyperlipidemia   . Hypertension   . Lung nodules   . Renal disorder   . Sarcoidosis   . Ulcer    recurring, from chronic venous insufficiency    Family History  Problem Relation Age of Onset  . Heart failure Mother   . Diabetes Mellitus II Mother   . Hypertension Mother   . Cancer Mother        unknown type  . Heart disease Father   . Stroke Father   . Diabetes Mellitus II Father     Past Surgical History:  Procedure Laterality Date  . cataract surgery Left 07-2013  . I&D EXTREMITY Bilateral 12/31/2018   Procedure: DEBRIDEMENT BILATERAL LEGS, APPLY VAC X 2;  Surgeon: [name], MD;  Location: [facility];  Service: Orthopedics;  Laterality: Bilateral;  . I&D EXTREMITY Bilateral 01/02/2019   Procedure: REPEAT DEBRIDEMENT BILATERAL LEGS, APPLY VAC X 2;  Surgeon: [name], MD;  Location:  [facility];  Service: Orthopedics;  Laterality: Bilateral;  . ICD IMPLANT N/A 07/05/2017   Procedure: ICD Implant;  Surgeon: Constance Haw, MD;  Location: [facility];  Service: Cardiovascular;  Laterality: N/A;  . LEFT HEART CATH AND CORONARY ANGIOGRAPHY N/A 07/03/2017   Procedure: Left Heart Cath and Coronary Angiography;  Surgeon: Belva Crome, MD;  Location: [facility];  Service: Cardiovascular;  Laterality: N/A;  . SKIN SPLIT GRAFT Bilateral 01/07/2019   Procedure: SPLIT THICKNESS SKIN GRAFT BILATERAL LEGS, APPLY VAC;  Surgeon: [name], MD;  Location: [facility];  Service: Orthopedics;  Laterality: Bilateral;   Social History  Occupational History  . Not on file  Tobacco Use  . Smoking status: Never Smoker  . Smokeless tobacco: Never Used  Substance and Sexual Activity  . Alcohol use: No  . Drug use: No  . Sexual activity: Not Currently

## 2019-07-08 ENCOUNTER — Encounter: Payer: Self-pay | Admitting: Family

## 2019-07-08 ENCOUNTER — Other Ambulatory Visit: Payer: Self-pay | Admitting: Family

## 2019-07-08 ENCOUNTER — Ambulatory Visit (INDEPENDENT_AMBULATORY_CARE_PROVIDER_SITE_OTHER): Payer: Medicare Other | Admitting: Family

## 2019-07-08 ENCOUNTER — Other Ambulatory Visit: Payer: Self-pay

## 2019-07-08 VITALS — Ht 67.0 in | Wt 299.0 lb

## 2019-07-08 DIAGNOSIS — I83002 Varicose veins of unspecified lower extremity with ulcer of calf: Secondary | ICD-10-CM

## 2019-07-08 DIAGNOSIS — L97209 Non-pressure chronic ulcer of unspecified calf with unspecified severity: Secondary | ICD-10-CM | POA: Diagnosis not present

## 2019-07-08 NOTE — Progress Notes (Signed)
Patient is a 58 year old woman presents today for dressing change.  She bilateral lower extremity ulcerations which are extensive.  She has been doing Dynaflex wraps.  She last had I&D surgery on June 22 and we are planning for repeat surgery next Wednesday.  She is not having any worsening of her symptoms no fever no chills we will reapply Dynaflex wraps.  Here today in follow-up for dressing change she will follow-up next Wednesday for surgery.  Repeat I&D.

## 2019-07-10 ENCOUNTER — Encounter (HOSPITAL_COMMUNITY): Payer: Self-pay | Admitting: *Deleted

## 2019-07-10 ENCOUNTER — Other Ambulatory Visit: Payer: Self-pay

## 2019-07-10 NOTE — Anesthesia Preprocedure Evaluation (Addendum)
Anesthesia Evaluation  Patient identified by MRN, date of birth, ID band Patient awake    Reviewed: Allergy & Precautions, NPO status , Patient's Chart, lab work & pertinent test results  Airway Mallampati: III  TM Distance: >3 FB Neck ROM: Full    Dental  (+) Edentulous Upper, Edentulous Lower   Pulmonary    breath sounds clear to auscultation       Cardiovascular hypertension,  Rhythm:Regular Rate:Normal     Neuro/Psych    GI/Hepatic   Endo/Other    Renal/GU      Musculoskeletal   Abdominal (+) + obese,   Peds  Hematology   Anesthesia Other Findings   Reproductive/Obstetrics                            Anesthesia Physical Anesthesia Plan  ASA: III  Anesthesia Plan: General   Post-op Pain Management:    Induction: Intravenous  PONV Risk Score and Plan: Ondansetron and Dexamethasone  Airway Management Planned: LMA  Additional Equipment:   Intra-op Plan:   Post-operative Plan:   Informed Consent: I have reviewed the patients History and Physical, chart, labs and discussed the procedure including the risks, benefits and alternatives for the proposed anesthesia with the patient or authorized representative who has indicated his/her understanding and acceptance.       Plan Discussed with: CRNA and Anesthesiologist  Anesthesia Plan Comments: (Follows with cardiology s/p cardiac arrest in 2018 now with ACID, combined HF. Cath in 2018 showed normal coronaries with global hypokinesis EF 35-40%. Pt did miss her last telehealth visit with cardiology, but she is also followed closely by her PCP Dr. [name] who helps manage her HF, last seen 05/13/19.  CKD is followed by Dr. Carolin Sicks.   Cath 07/03/17:  Left dominant coronary anatomy.  Normal coronary arteries.  Mild to moderate global left ventricular hypo-kinesis with estimated EF 35-40%. LV end-diastolic pressure is up and  normal. Combined systolic and diastolic heart failure, chronic Recommendations:  Further management per treating team including CAD including guideline directed heart failure therapy  EP evaluation for possible AICD.  TTE 06/27/17: - Left ventricle: Diffuse hypokinesis worse in the inferior basal   wall The cavity size was moderately dilated. Wall thickness was   increased in a pattern of moderate LVH. Systolic function was   mildly to moderately reduced. The estimated ejection fraction was   in the range of 40% to 45%. Left ventricular diastolic function   parameters were normal. - Aortic valve: There was mild regurgitation. - Mitral valve: There was moderate regurgitation. - Left atrium: The atrium was moderately dilated. - Atrial septum: No defect or patent foramen ovale was identified.  )       Anesthesia Quick Evaluation

## 2019-07-10 NOTE — Progress Notes (Signed)
Kari [name] denies chest pain or shortness of breath. Patient denies that she nor her family have had any s/s of Covid19, patient will be tested Saturday am. I went over ERAS instructions with patient, I typed instructions and will send instructions and Pre- Surgery Ensure with staff to give to patient when she is tested .

## 2019-07-10 NOTE — Pre-Procedure Instructions (Signed)
    VALORI HOLLENKAMP  07/10/2019      Your procedure is scheduled on Wednesday, July 22.   Report to [facility], Main Entrance or Entrance "A" at 8:00 AM   Call this number if you have problems the morning of surgery: [phone]  This is the number for the Pre- Surgical Desk.    Remember:  Do not eat after midnight Tuesday, July 21.  You may drink clear liquids until 7:30 .  Clear liquids allowed are:    Water, Juice (non-citric and without pulp), Carbonated beverages, Clear Tea, Black Coffee only, Plain Jell-O only, Gatorade and Plain Popsicles only   Drink Pre- Surgery Drink at 7:00 AM  Nothing to drink after 7:30 am  ( 3hours before surgery)   Take these medicines the morning of surgery with A SIP OF WATER :              carvedilol    allopurinol (ZYLOPRIM)              aspirin EC 81 MG   Use Albuterol inhaler if needed and please bring it with you.              If Needed take:             Tylenol             loratadine (CLARITIN             fluticasone (FLONASE)              Wash your body with antibacteria soap, wear clean clothes, brush your dentures.             Do not wear jewelry, make-up or nail polish.  Do not wear lotions, powders, or perfumes, or deodorant.  Do not shave 48 hours prior to surgery.  Men may shave face and neck.  Do not bring valuables to the hospital.  [facility] is not responsible for any belongings or valuables.   Any questions regarding instructions, call Jan at [facility] Monday or Tuesday.  You may leave a message and I will call you back.

## 2019-07-11 ENCOUNTER — Other Ambulatory Visit (HOSPITAL_COMMUNITY)
Admission: RE | Admit: 2019-07-11 | Discharge: 2019-07-11 | Disposition: A | Discharge status: Home / Self Care | Payer: Medicare Other | Source: Ambulatory Visit | Attending: Orthopedic Surgery | Admitting: Orthopedic Surgery

## 2019-07-11 DIAGNOSIS — Z1159 Encounter for screening for other viral diseases: Secondary | ICD-10-CM | POA: Diagnosis not present

## 2019-07-11 LAB — SARS CORONAVIRUS 2 (TAT 6-24 HRS): SARS Coronavirus 2: NEGATIVE

## 2019-07-14 MED ORDER — DEXTROSE 5 % IV SOLN
3.0000 g | INTRAVENOUS | Status: AC
  Administered 2019-07-15: 11:00:00 3 g via INTRAVENOUS
  Filled 2019-07-14: qty 3000
  Filled 2019-07-14: qty 3

## 2019-07-14 NOTE — Progress Notes (Signed)
Paged the rep for [employer] ICD. Orders stated that "Industry should be consulted prior to procedure to check device due to no in clinic clinic check >1 year. Dionne Milo, rep for [employer] returned the page and stated that someone will be here Wednesday around 9 AM for device check.

## 2019-07-15 ENCOUNTER — Inpatient Hospital Stay (HOSPITAL_COMMUNITY): Payer: Medicare Other | Admitting: Physician Assistant

## 2019-07-15 ENCOUNTER — Encounter (HOSPITAL_COMMUNITY): Payer: Self-pay

## 2019-07-15 ENCOUNTER — Encounter (HOSPITAL_COMMUNITY)
Admission: RE | Disposition: A | Discharge status: Rehabilitation Facility | Payer: Self-pay | Source: Home / Self Care | Attending: Orthopedic Surgery

## 2019-07-15 ENCOUNTER — Other Ambulatory Visit: Payer: Self-pay

## 2019-07-15 ENCOUNTER — Inpatient Hospital Stay (HOSPITAL_COMMUNITY)
Admission: RE | Admit: 2019-07-15 | Discharge: 2019-07-20 | DRG: 264 | Disposition: A | Discharge status: Rehabilitation Facility | Payer: Medicare Other | Attending: Orthopedic Surgery | Admitting: Orthopedic Surgery

## 2019-07-15 DIAGNOSIS — R5381 Other malaise: Secondary | ICD-10-CM | POA: Diagnosis not present

## 2019-07-15 DIAGNOSIS — Z20828 Contact with and (suspected) exposure to other viral communicable diseases: Secondary | ICD-10-CM | POA: Diagnosis present

## 2019-07-15 DIAGNOSIS — I952 Hypotension due to drugs: Secondary | ICD-10-CM | POA: Diagnosis not present

## 2019-07-15 DIAGNOSIS — E785 Hyperlipidemia, unspecified: Secondary | ICD-10-CM | POA: Diagnosis present

## 2019-07-15 DIAGNOSIS — I87313 Chronic venous hypertension (idiopathic) with ulcer of bilateral lower extremity: Secondary | ICD-10-CM | POA: Diagnosis not present

## 2019-07-15 DIAGNOSIS — I5042 Chronic combined systolic (congestive) and diastolic (congestive) heart failure: Secondary | ICD-10-CM | POA: Diagnosis not present

## 2019-07-15 DIAGNOSIS — Z6841 Body Mass Index (BMI) 40.0 and over, adult: Secondary | ICD-10-CM | POA: Diagnosis not present

## 2019-07-15 DIAGNOSIS — I83029 Varicose veins of left lower extremity with ulcer of unspecified site: Secondary | ICD-10-CM | POA: Diagnosis not present

## 2019-07-15 DIAGNOSIS — I110 Hypertensive heart disease with heart failure: Secondary | ICD-10-CM | POA: Diagnosis not present

## 2019-07-15 DIAGNOSIS — E876 Hypokalemia: Secondary | ICD-10-CM | POA: Diagnosis not present

## 2019-07-15 DIAGNOSIS — I252 Old myocardial infarction: Secondary | ICD-10-CM | POA: Diagnosis not present

## 2019-07-15 DIAGNOSIS — R918 Other nonspecific abnormal finding of lung field: Secondary | ICD-10-CM | POA: Diagnosis present

## 2019-07-15 DIAGNOSIS — D869 Sarcoidosis, unspecified: Secondary | ICD-10-CM | POA: Diagnosis present

## 2019-07-15 DIAGNOSIS — I130 Hypertensive heart and chronic kidney disease with heart failure and stage 1 through stage 4 chronic kidney disease, or unspecified chronic kidney disease: Secondary | ICD-10-CM | POA: Diagnosis not present

## 2019-07-15 DIAGNOSIS — Z713 Dietary counseling and surveillance: Secondary | ICD-10-CM

## 2019-07-15 DIAGNOSIS — I83019 Varicose veins of right lower extremity with ulcer of unspecified site: Secondary | ICD-10-CM

## 2019-07-15 DIAGNOSIS — N183 Chronic kidney disease, stage 3 (moderate): Secondary | ICD-10-CM | POA: Diagnosis not present

## 2019-07-15 DIAGNOSIS — Z95810 Presence of automatic (implantable) cardiac defibrillator: Secondary | ICD-10-CM

## 2019-07-15 DIAGNOSIS — I5033 Acute on chronic diastolic (congestive) heart failure: Secondary | ICD-10-CM | POA: Diagnosis not present

## 2019-07-15 DIAGNOSIS — L97919 Non-pressure chronic ulcer of unspecified part of right lower leg with unspecified severity: Secondary | ICD-10-CM | POA: Diagnosis present

## 2019-07-15 DIAGNOSIS — L97229 Non-pressure chronic ulcer of left calf with unspecified severity: Secondary | ICD-10-CM | POA: Diagnosis not present

## 2019-07-15 DIAGNOSIS — Z833 Family history of diabetes mellitus: Secondary | ICD-10-CM

## 2019-07-15 DIAGNOSIS — F329 Major depressive disorder, single episode, unspecified: Secondary | ICD-10-CM | POA: Diagnosis present

## 2019-07-15 DIAGNOSIS — G8918 Other acute postprocedural pain: Secondary | ICD-10-CM | POA: Diagnosis not present

## 2019-07-15 DIAGNOSIS — L97219 Non-pressure chronic ulcer of right calf with unspecified severity: Secondary | ICD-10-CM | POA: Diagnosis not present

## 2019-07-15 DIAGNOSIS — L97912 Non-pressure chronic ulcer of unspecified part of right lower leg with fat layer exposed: Secondary | ICD-10-CM | POA: Diagnosis not present

## 2019-07-15 DIAGNOSIS — J45909 Unspecified asthma, uncomplicated: Secondary | ICD-10-CM | POA: Diagnosis not present

## 2019-07-15 DIAGNOSIS — Z885 Allergy status to narcotic agent status: Secondary | ICD-10-CM | POA: Diagnosis not present

## 2019-07-15 DIAGNOSIS — I872 Venous insufficiency (chronic) (peripheral): Secondary | ICD-10-CM | POA: Diagnosis not present

## 2019-07-15 DIAGNOSIS — D638 Anemia in other chronic diseases classified elsewhere: Secondary | ICD-10-CM | POA: Diagnosis present

## 2019-07-15 DIAGNOSIS — L97929 Non-pressure chronic ulcer of unspecified part of left lower leg with unspecified severity: Secondary | ICD-10-CM | POA: Diagnosis present

## 2019-07-15 DIAGNOSIS — I83008 Varicose veins of unspecified lower extremity with ulcer other part of lower leg: Secondary | ICD-10-CM | POA: Diagnosis not present

## 2019-07-15 DIAGNOSIS — R0989 Other specified symptoms and signs involving the circulatory and respiratory systems: Secondary | ICD-10-CM | POA: Diagnosis not present

## 2019-07-15 DIAGNOSIS — I83209 Varicose veins of unspecified lower extremity with both ulcer of unspecified site and inflammation: Secondary | ICD-10-CM | POA: Diagnosis not present

## 2019-07-15 DIAGNOSIS — I10 Essential (primary) hypertension: Secondary | ICD-10-CM | POA: Diagnosis not present

## 2019-07-15 DIAGNOSIS — I428 Other cardiomyopathies: Secondary | ICD-10-CM | POA: Diagnosis not present

## 2019-07-15 DIAGNOSIS — L97922 Non-pressure chronic ulcer of unspecified part of left lower leg with fat layer exposed: Secondary | ICD-10-CM | POA: Diagnosis not present

## 2019-07-15 DIAGNOSIS — Z8249 Family history of ischemic heart disease and other diseases of the circulatory system: Secondary | ICD-10-CM

## 2019-07-15 HISTORY — PX: I & D EXTREMITY: SHX5045

## 2019-07-15 HISTORY — DX: Personal history of other medical treatment: Z92.89

## 2019-07-15 HISTORY — DX: Unspecified osteoarthritis, unspecified site: M19.90

## 2019-07-15 LAB — GLUCOSE, CAPILLARY: Glucose-Capillary: 101 mg/dL — ABNORMAL HIGH (ref 70–99)

## 2019-07-15 LAB — BASIC METABOLIC PANEL
Anion gap: 10 (ref 5–15)
BUN: 12 mg/dL (ref 6–20)
CO2: 24 mmol/L (ref 22–32)
Calcium: 8.8 mg/dL — ABNORMAL LOW (ref 8.9–10.3)
Chloride: 102 mmol/L (ref 98–111)
Creatinine, Ser: 1.27 mg/dL — ABNORMAL HIGH (ref 0.44–1.00)
GFR calc Af Amer: 54 mL/min — ABNORMAL LOW (ref >60)
GFR calc non Af Amer: 47 mL/min — ABNORMAL LOW (ref >60)
Glucose, Bld: 185 mg/dL — ABNORMAL HIGH (ref 70–99)
Potassium: 3.4 mmol/L — ABNORMAL LOW (ref 3.5–5.1)
Sodium: 136 mmol/L (ref 135–145)

## 2019-07-15 LAB — SURGICAL PCR SCREEN
MRSA, PCR: NEGATIVE
Staphylococcus aureus: NEGATIVE

## 2019-07-15 LAB — CBC
HCT: 35.6 % — ABNORMAL LOW (ref 36.0–46.0)
Hemoglobin: 10.9 g/dL — ABNORMAL LOW (ref 12.0–15.0)
MCH: 28.2 pg (ref 26.0–34.0)
MCHC: 30.6 g/dL (ref 30.0–36.0)
MCV: 92.2 fL (ref 80.0–100.0)
Platelets: 318 10*3/uL (ref 150–400)
RBC: 3.86 MIL/uL — ABNORMAL LOW (ref 3.87–5.11)
RDW: 15.7 % — ABNORMAL HIGH (ref 11.5–15.5)
WBC: 11.2 10*3/uL — ABNORMAL HIGH (ref 4.0–10.5)
nRBC: 0 % (ref 0.0–0.2)

## 2019-07-15 SURGERY — IRRIGATION AND DEBRIDEMENT EXTREMITY
Anesthesia: General | Site: Leg Lower | Laterality: Bilateral

## 2019-07-15 MED ORDER — PROPOFOL 10 MG/ML IV BOLUS
INTRAVENOUS | Status: DC | PRN
  Administered 2019-07-15: 150 mg via INTRAVENOUS

## 2019-07-15 MED ORDER — DOCUSATE SODIUM 100 MG PO CAPS
100.0000 mg | ORAL_CAPSULE | Freq: Two times a day (BID) | ORAL | Status: DC
  Administered 2019-07-15 – 2019-07-16 (×4): 100 mg via ORAL
  Filled 2019-07-15 (×4): qty 1

## 2019-07-15 MED ORDER — ONDANSETRON HCL 4 MG/2ML IJ SOLN
INTRAMUSCULAR | Status: AC
  Filled 2019-07-15: qty 2

## 2019-07-15 MED ORDER — METOCLOPRAMIDE HCL 5 MG PO TABS: 5.0000 mg | ORAL_TABLET | Freq: Three times a day (TID) | ORAL | Status: DC | PRN

## 2019-07-15 MED ORDER — ONDANSETRON HCL 4 MG/2ML IJ SOLN: 4.0000 mg | Freq: Once | INTRAMUSCULAR | Status: DC | PRN

## 2019-07-15 MED ORDER — MIDAZOLAM HCL 2 MG/2ML IJ SOLN
INTRAMUSCULAR | Status: AC
  Filled 2019-07-15: qty 2

## 2019-07-15 MED ORDER — OXYCODONE HCL 5 MG PO TABS
ORAL_TABLET | ORAL | Status: AC
  Administered 2019-07-16: 10 mg via ORAL
  Filled 2019-07-15: qty 2

## 2019-07-15 MED ORDER — FENTANYL CITRATE (PF) 100 MCG/2ML IJ SOLN
25.0000 ug | INTRAMUSCULAR | Status: DC | PRN
  Administered 2019-07-15: 50 ug via INTRAVENOUS
  Administered 2019-07-15 (×2): 25 ug via INTRAVENOUS

## 2019-07-15 MED ORDER — OXYCODONE HCL 5 MG PO TABS
10.0000 mg | ORAL_TABLET | ORAL | Status: DC | PRN
  Administered 2019-07-15 – 2019-07-20 (×4): 10 mg via ORAL
  Filled 2019-07-15 (×2): qty 2

## 2019-07-15 MED ORDER — POLYETHYLENE GLYCOL 3350 17 G PO PACK: 17.0000 g | PACK | Freq: Every day | ORAL | Status: DC | PRN

## 2019-07-15 MED ORDER — FENTANYL CITRATE (PF) 100 MCG/2ML IJ SOLN
INTRAMUSCULAR | Status: AC
  Filled 2019-07-15: qty 2

## 2019-07-15 MED ORDER — CARVEDILOL 6.25 MG PO TABS
6.2500 mg | ORAL_TABLET | Freq: Two times a day (BID) | ORAL | Status: DC
  Administered 2019-07-15 – 2019-07-20 (×10): 6.25 mg via ORAL
  Filled 2019-07-15 (×10): qty 1

## 2019-07-15 MED ORDER — ASPIRIN EC 81 MG PO TBEC
81.0000 mg | DELAYED_RELEASE_TABLET | Freq: Every day | ORAL | Status: DC
  Administered 2019-07-16 – 2019-07-20 (×4): 81 mg via ORAL
  Filled 2019-07-15 (×4): qty 1

## 2019-07-15 MED ORDER — OXYCODONE HCL 5 MG PO TABS
5.0000 mg | ORAL_TABLET | ORAL | Status: DC | PRN
  Administered 2019-07-15 – 2019-07-17 (×4): 10 mg via ORAL
  Administered 2019-07-18: 5 mg via ORAL
  Administered 2019-07-18 – 2019-07-19 (×3): 10 mg via ORAL
  Filled 2019-07-15 (×9): qty 2

## 2019-07-15 MED ORDER — 0.9 % SODIUM CHLORIDE (POUR BTL) OPTIME
TOPICAL | Status: DC | PRN
  Administered 2019-07-15: 11:00:00 1000 mL

## 2019-07-15 MED ORDER — LIDOCAINE 2% (20 MG/ML) 5 ML SYRINGE
INTRAMUSCULAR | Status: DC | PRN
  Administered 2019-07-15: 60 mg via INTRAVENOUS

## 2019-07-15 MED ORDER — ONDANSETRON HCL 4 MG/2ML IJ SOLN
INTRAMUSCULAR | Status: DC | PRN
  Administered 2019-07-15: 4 mg via INTRAVENOUS

## 2019-07-15 MED ORDER — METHOCARBAMOL 500 MG PO TABS
500.0000 mg | ORAL_TABLET | Freq: Four times a day (QID) | ORAL | Status: DC | PRN
  Administered 2019-07-15 – 2019-07-16 (×2): 500 mg via ORAL
  Filled 2019-07-15 (×2): qty 1

## 2019-07-15 MED ORDER — BISACODYL 10 MG RE SUPP: 10.0000 mg | Freq: Every day | RECTAL | Status: DC | PRN

## 2019-07-15 MED ORDER — HYDROMORPHONE HCL 1 MG/ML IJ SOLN
0.5000 mg | INTRAMUSCULAR | Status: DC | PRN
  Administered 2019-07-15 – 2019-07-19 (×2): 1 mg via INTRAVENOUS
  Filled 2019-07-15 (×2): qty 1

## 2019-07-15 MED ORDER — ACETAMINOPHEN 325 MG PO TABS: 325.0000 mg | ORAL_TABLET | Freq: Four times a day (QID) | ORAL | Status: DC | PRN

## 2019-07-15 MED ORDER — METOCLOPRAMIDE HCL 5 MG/ML IJ SOLN: 5.0000 mg | Freq: Three times a day (TID) | INTRAMUSCULAR | Status: DC | PRN

## 2019-07-15 MED ORDER — LACTATED RINGERS IV SOLN
INTRAVENOUS | Status: DC
  Administered 2019-07-15: 09:00:00 via INTRAVENOUS

## 2019-07-15 MED ORDER — ONDANSETRON HCL 4 MG/2ML IJ SOLN: 4.0000 mg | Freq: Four times a day (QID) | INTRAMUSCULAR | Status: DC | PRN

## 2019-07-15 MED ORDER — ALBUTEROL SULFATE (2.5 MG/3ML) 0.083% IN NEBU: 2.5000 mg | INHALATION_SOLUTION | Freq: Four times a day (QID) | RESPIRATORY_TRACT | Status: DC | PRN

## 2019-07-15 MED ORDER — FENTANYL CITRATE (PF) 250 MCG/5ML IJ SOLN
INTRAMUSCULAR | Status: AC
  Filled 2019-07-15: qty 5

## 2019-07-15 MED ORDER — ONDANSETRON HCL 4 MG PO TABS: 4.0000 mg | ORAL_TABLET | Freq: Four times a day (QID) | ORAL | Status: DC | PRN

## 2019-07-15 MED ORDER — MAGNESIUM CITRATE PO SOLN: 1.0000 | Freq: Once | ORAL | Status: DC | PRN

## 2019-07-15 MED ORDER — CEFAZOLIN SODIUM-DEXTROSE 2-4 GM/100ML-% IV SOLN
2.0000 g | Freq: Three times a day (TID) | INTRAVENOUS | Status: DC
  Administered 2019-07-15 – 2019-07-17 (×6): 2 g via INTRAVENOUS
  Filled 2019-07-15 (×6): qty 100

## 2019-07-15 MED ORDER — SODIUM CHLORIDE 0.9 % IV SOLN
INTRAVENOUS | Status: DC
  Administered 2019-07-15: 14:00:00 via INTRAVENOUS

## 2019-07-15 MED ORDER — SODIUM CHLORIDE 0.9 % IR SOLN
Status: DC | PRN
  Administered 2019-07-15: 3000 mL

## 2019-07-15 MED ORDER — METHOCARBAMOL 1000 MG/10ML IJ SOLN
500.0000 mg | Freq: Four times a day (QID) | INTRAVENOUS | Status: DC | PRN
  Filled 2019-07-15: qty 5

## 2019-07-15 MED ORDER — CHLORHEXIDINE GLUCONATE 4 % EX LIQD: 60.0000 mL | Freq: Once | CUTANEOUS | Status: DC

## 2019-07-15 MED ORDER — MIDAZOLAM HCL 5 MG/5ML IJ SOLN
INTRAMUSCULAR | Status: DC | PRN
  Administered 2019-07-15: 2 mg via INTRAVENOUS

## 2019-07-15 MED ORDER — FUROSEMIDE 40 MG PO TABS
60.0000 mg | ORAL_TABLET | Freq: Every day | ORAL | Status: DC
  Administered 2019-07-15 – 2019-07-20 (×5): 60 mg via ORAL
  Filled 2019-07-15: qty 1
  Filled 2019-07-15: qty 2
  Filled 2019-07-15: qty 1
  Filled 2019-07-15: qty 2
  Filled 2019-07-15: qty 1

## 2019-07-15 MED ORDER — ALLOPURINOL 100 MG PO TABS
100.0000 mg | ORAL_TABLET | Freq: Two times a day (BID) | ORAL | Status: DC
  Administered 2019-07-15 – 2019-07-20 (×9): 100 mg via ORAL
  Filled 2019-07-15 (×9): qty 1

## 2019-07-15 MED ORDER — LIDOCAINE 2% (20 MG/ML) 5 ML SYRINGE
INTRAMUSCULAR | Status: AC
  Filled 2019-07-15: qty 5

## 2019-07-15 MED ORDER — FENTANYL CITRATE (PF) 100 MCG/2ML IJ SOLN
INTRAMUSCULAR | Status: DC | PRN
  Administered 2019-07-15 (×2): 25 ug via INTRAVENOUS
  Administered 2019-07-15: 50 ug via INTRAVENOUS

## 2019-07-15 SURGICAL SUPPLY — 36 items
BLADE SURG 21 STRL SS (BLADE) ×5 IMPLANT
BNDG COHESIVE 6X5 TAN STRL LF (GAUZE/BANDAGES/DRESSINGS) ×4 IMPLANT
BNDG GAUZE ELAST 4 BULKY (GAUZE/BANDAGES/DRESSINGS) ×2 IMPLANT
CANISTER WOUND CARE 500ML ATS (WOUND CARE) ×4 IMPLANT
COVER SURGICAL LIGHT HANDLE (MISCELLANEOUS) ×4 IMPLANT
COVER WAND RF STERILE (DRAPES) ×1 IMPLANT
DRAPE EXTREMITY BILATERAL (DRAPES) ×2 IMPLANT
DRAPE U-SHAPE 47X51 STRL (DRAPES) ×5 IMPLANT
DRESSING VERAFLO CLEANSE CC (GAUZE/BANDAGES/DRESSINGS) IMPLANT
DRSG ADAPTIC 3X8 NADH LF (GAUZE/BANDAGES/DRESSINGS) ×1 IMPLANT
DRSG VERAFLO CLEANSE CC (GAUZE/BANDAGES/DRESSINGS) ×3
DURAPREP 26ML APPLICATOR (WOUND CARE) ×7 IMPLANT
ELECT REM PT RETURN 9FT ADLT (ELECTROSURGICAL) ×3
ELECTRODE REM PT RTRN 9FT ADLT (ELECTROSURGICAL) IMPLANT
GAUZE SPONGE 4X4 12PLY STRL (GAUZE/BANDAGES/DRESSINGS) ×1 IMPLANT
GLOVE BIOGEL PI IND STRL 9 (GLOVE) ×1 IMPLANT
GLOVE BIOGEL PI INDICATOR 9 (GLOVE) ×2
GLOVE SURG ORTHO 9.0 STRL STRW (GLOVE) ×3 IMPLANT
GOWN STRL REUS W/ TWL XL LVL3 (GOWN DISPOSABLE) ×2 IMPLANT
GOWN STRL REUS W/TWL XL LVL3 (GOWN DISPOSABLE) ×2
HANDPIECE INTERPULSE COAX TIP (DISPOSABLE)
KIT BASIN OR (CUSTOM PROCEDURE TRAY) ×3 IMPLANT
KIT TURNOVER KIT B (KITS) ×3 IMPLANT
MANIFOLD NEPTUNE II (INSTRUMENTS) ×3 IMPLANT
NS IRRIG 1000ML POUR BTL (IV SOLUTION) ×3 IMPLANT
PACK ORTHO EXTREMITY (CUSTOM PROCEDURE TRAY) ×3 IMPLANT
PAD ARMBOARD 7.5X6 YLW CONV (MISCELLANEOUS) ×6 IMPLANT
PAD NEG PRESSURE SENSATRAC (MISCELLANEOUS) ×4 IMPLANT
SET HNDPC FAN SPRY TIP SCT (DISPOSABLE) IMPLANT
STOCKINETTE IMPERVIOUS 9X36 MD (GAUZE/BANDAGES/DRESSINGS) ×2 IMPLANT
SWAB COLLECTION DEVICE MRSA (MISCELLANEOUS) ×1 IMPLANT
SWAB CULTURE ESWAB REG 1ML (MISCELLANEOUS) IMPLANT
TOWEL GREEN STERILE (TOWEL DISPOSABLE) ×3 IMPLANT
TUBE CONNECTING 12'X1/4 (SUCTIONS) ×1
TUBE CONNECTING 12X1/4 (SUCTIONS) ×2 IMPLANT
YANKAUER SUCT BULB TIP NO VENT (SUCTIONS) ×3 IMPLANT

## 2019-07-15 NOTE — Anesthesia Postprocedure Evaluation (Signed)
Anesthesia Post Note  Patient: Kari [name]  Procedure(s) Performed: IRRIGATION AND DEBRIDEMENT VENOUS STASIS INSUFFICIENCY ULCERATIONS BILATERAL LOWER EXTREMITIES (Bilateral Leg Lower)     Patient location during evaluation: PACU Anesthesia Type: General Level of consciousness: awake and alert Pain management: pain level controlled Vital Signs Assessment: post-procedure vital signs reviewed and stable Respiratory status: spontaneous breathing, nonlabored ventilation, respiratory function stable and patient connected to nasal cannula oxygen Cardiovascular status: blood pressure returned to baseline and stable Postop Assessment: no apparent nausea or vomiting Anesthetic complications: no    Last Vitals:  Vitals:   07/15/19 1332 07/15/19 1417  BP: (!) 119/58 (!) 114/52  Pulse: 73 72  Resp:    Temp:    SpO2:      Last Pain:  Vitals:   07/15/19 1307  TempSrc:   PainSc: 4                  Marko Skalski COKER

## 2019-07-15 NOTE — Anesthesia Procedure Notes (Signed)
Procedure Name: LMA Insertion Date/Time: 07/15/2019 10:44 AM Performed by: Alain Marion, CRNA Pre-anesthesia Checklist: Patient identified, Emergency Drugs available, Suction available and Patient being monitored Patient Re-evaluated:Patient Re-evaluated prior to induction Oxygen Delivery Method: Circle System Utilized Preoxygenation: Pre-oxygenation with 100% oxygen Induction Type: IV induction LMA: LMA inserted LMA Size: 4.0 Number of attempts: 1 Airway Equipment and Method: Bite block Placement Confirmation: positive ETCO2 Tube secured with: Tape Dental Injury: Teeth and Oropharynx as per pre-operative assessment

## 2019-07-15 NOTE — Op Note (Signed)
07/15/2019  11:44 AM  PATIENT:  Kari [name]    PRE-OPERATIVE DIAGNOSIS:  Bilateral Venous Ulcers  POST-OPERATIVE DIAGNOSIS:  Same  PROCEDURE:  IRRIGATION AND DEBRIDEMENT VENOUS STASIS INSUFFICIENCY ULCERATIONS BILATERAL LOWER EXTREMITIES Application of cleanse choice Praveena wound VAC dressings x2.   SURGEON:  [name], MD  PHYSICIAN ASSISTANT:None ANESTHESIA:   General  PREOPERATIVE INDICATIONS:  Kari [name] is a  58 y.o. female with a diagnosis of Bilateral Venous Ulcers who failed conservative measures and elected for surgical management.    The risks benefits and alternatives were discussed with the patient preoperatively including but not limited to the risks of infection, bleeding, nerve injury, cardiopulmonary complications, the need for revision surgery, among others, and the patient was willing to proceed.  OPERATIVE IMPLANTS: Cleanse choice Praveena wound VAC solid large volume  @ENCIMAGES @  OPERATIVE FINDINGS: Large venous stasis ulcers that were 13 x 17 cm on each leg.  No abscess no exposed bone or tendon.  OPERATIVE PROCEDURE: Patient was brought the operating room underwent a general anesthetic.  After adequate levels anesthesia were obtained patient's both lower extremities were prepped using DuraPrep draped into a sterile field a timeout was called.  A 21 blade knife was used to excise skin and soft tissue muscle and fascia from both legs.  Focus was first on the right leg.  After excisional debridement of skin soft tissue muscle and fascia electrocautery was used for hemostasis the wound was irrigated with pulsatile lavage and initial compression dressing was applied with the sponges and co-band.  Attention was then focused on the left lower extremity a 21 blade knife was also used to excise skin and soft tissue muscle and fascia there was no exposed bone or tendon.  Both wounds were 13 x 17 cm.  This wound was also irrigated with pulsatile lavage  electrocautery was used for hemostasis.  This was then also wrapped with the sponges and a Coban wrap.  Attention was then focused back on the right lower extremity.  Further hemostasis was obtained with electrocautery the thick cleanse choice wound VAC sponge was applied this had a good suction fit in the right lower extremity was then overwrapped with a Covan dressing.  This had a good suction fit.  The provisional wrap was then removed from the left lower extremity the thick cleanse choice sponge was applied this had a good suction fit this was overwrapped with Covan is also showed good suction and patient was extubated taken the PACU in stable condition.   DISCHARGE PLANNING:  Antibiotic duration: Continue IV antibiotics through Friday  Weightbearing: Weightbearing as tolerated  Pain medication: Opioid pathway  Dressing care/ Wound VAC: Continue cleanse choice wound vacs until Friday  Ambulatory devices: Walker  Discharge to: Plan for discharge after skin graft on Friday.  Follow-up: In the office 1 week post operative.

## 2019-07-15 NOTE — Transfer of Care (Signed)
Immediate Anesthesia Transfer of Care Note  Patient: Kari [name]  Procedure(s) Performed: IRRIGATION AND DEBRIDEMENT VENOUS STASIS INSUFFICIENCY ULCERATIONS BILATERAL LOWER EXTREMITIES (Bilateral Leg Lower)  Patient Location: PACU  Anesthesia Type:General  Level of Consciousness: awake, alert  and oriented  Airway & Oxygen Therapy: Patient Spontanous Breathing and Patient connected to face mask oxygen  Post-op Assessment: Report given to RN and Post -op Vital signs reviewed and stable  Post vital signs: Reviewed and stable  Last Vitals:  Vitals Value Taken Time  BP 121/85 07/15/19 1135  Temp    Pulse 76 07/15/19 1135  Resp 18 07/15/19 1135  SpO2 100 % 07/15/19 1135  Vitals shown include unvalidated device data.  Last Pain:  Vitals:   07/15/19 0848  PainSc: 0-No pain      Patients Stated Pain Goal: 2 (47/84/12 8208)  Complications: No apparent anesthesia complications

## 2019-07-15 NOTE — Progress Notes (Signed)
Pt oriented to room All belongings in reach including phone, call light and table  Pt educated on plan of care  Pt connected to frequent vital signs  Dressings clean dry and intact  No output on wound vac  Will continue to monitor

## 2019-07-15 NOTE — H&P (Signed)
Kari [name] is an 58 y.o. female.   Chief Complaint: Chronic venous stasis ulcers bilateral lower extremities. HPI: Patient is a 58 year old woman with bilateral lower extremity venous stasis ulcers.  She has undergone skin grafting back in January.  Patient has undergone serial compression wraps with persistent ulceration and presents at this time for repeat debridement and application of skin graft.  Past Medical History:  Diagnosis Date  . Arthritis   . Asthma   . CHF (congestive heart failure) ([place]) 03/12/2014   a. EF 40-45% by echo in 06/2017 with normal cors by cath. ICD placed following VT arrest  . Depression   . Headache(784.0)   . History of blood transfusion   . Hyperlipidemia   . Hypertension   . Lung nodules   . Myocardial infarction ([place]) 06/26/2014  . Renal disorder    followed by [facility]  . Sarcoidosis   . Ulcer    recurring, from chronic venous insufficiency    Past Surgical History:  Procedure Laterality Date  . cataract surgery Left 07-2013  . I&D EXTREMITY Bilateral 12/31/2018   Procedure: DEBRIDEMENT BILATERAL LEGS, APPLY VAC X 2;  Surgeon: [name], MD;  Location: [facility];  Service: Orthopedics;  Laterality: Bilateral;  . I&D EXTREMITY Bilateral 01/02/2019   Procedure: REPEAT DEBRIDEMENT BILATERAL LEGS, APPLY VAC X 2;  Surgeon: [name], MD;  Location: [facility];  Service: Orthopedics;  Laterality: Bilateral;  . ICD IMPLANT N/A 07/05/2017   Procedure: ICD Implant;  Surgeon: Constance Haw, MD;  Location: [place] [facility];  Service: Cardiovascular;  Laterality: N/A;  . LEFT HEART CATH AND CORONARY ANGIOGRAPHY N/A 07/03/2017   Procedure: Left Heart Cath and Coronary Angiography;  Surgeon: Belva Crome, MD;  Location: [place] [facility];  Service: Cardiovascular;  Laterality: N/A;  . SKIN SPLIT GRAFT Bilateral 01/07/2019   Procedure: SPLIT THICKNESS SKIN GRAFT BILATERAL LEGS, APPLY VAC;  Surgeon: [name], MD;  Location: [facility];   Service: Orthopedics;  Laterality: Bilateral;    Family History  Problem Relation Age of Onset  . Heart failure Mother   . Diabetes Mellitus II Mother   . Hypertension Mother   . Cancer Mother        unknown type  . Heart disease Father   . Stroke Father   . Diabetes Mellitus II Father    Social History:  reports that she has never smoked. She has never used smokeless tobacco. She reports that she does not drink alcohol or use drugs.  Allergies:  Allergies  Allergen Reactions  . Tramadol Other (See Comments)    hallucination    No medications prior to admission.    No results found for this or any previous visit (from the past 48 hour(s)). No results found.  Review of Systems  All other systems reviewed and are negative.   Last menstrual period 11/21/2011. Physical Exam   Assessment/Plan Assessment: Venous insufficiency ulcerations bilateral lower extremities. Plan: We will plan for repeat irrigation and debridement application of cleanse choice wound VAC dressing with plan to return the operating room on Friday for repeat application of skin graft.  [name], MD 07/15/2019, 6:45 AM

## 2019-07-15 NOTE — OR Nursing (Signed)
Assumed care of pt. @ 1115

## 2019-07-16 ENCOUNTER — Encounter (HOSPITAL_COMMUNITY): Payer: Self-pay | Admitting: Orthopedic Surgery

## 2019-07-16 NOTE — Progress Notes (Signed)
Patient ID: Kari [name], female   DOB: Mar 30, 1961, 58 y.o.   MRN: [id number] Patient is post operative day 1 debridement of venous ulcers both legs, about 150 cc in both wound vac canisters, plan for STSG tomorrow both legs, possible discharge Saturday

## 2019-07-16 NOTE — Evaluation (Signed)
Physical Therapy Evaluation Patient Details Name: Kari [name] MRN: [id number] DOB: 29-Nov-1961 Today's Date: 07/16/2019   History of Present Illness  58 yo female with onset of BLE debridement for venous stasis ulcers was referred to PT.  Had skin grafting in Jan 2020, then compression wraps.  Now has wound vacs in place, WBAT.  PMHx;  chronic venous stasis ulcers LE's, skin grafting LE's, HA, transfusion, renal disorder, sarcoidosis, lung nodules, CHF, EF 40-45%, HTN, MI, depression, asthma, OA  Clinical Impression  Pt was evaluated for initial mobility assessment, now demonstrating ability to transfer with help but is not ambulatory and is quite weak by comparison with her description of PLOF.  Will follow up acutely with her on gait and transfer training, and add in LE strengthening as tolerated with reasonable accommodations for her skin wounds.  Nursing was set to return her to bed, and noted to them to replace purwick if appropriate and to elevate legs if needed as pt prefers them down for now.    Follow Up Recommendations SNF    Equipment Recommendations  Rolling walker with 5" wheels(if pt does not have adequate equipment)    Recommendations for Other Services       Precautions / Restrictions Precautions Precautions: Fall Precaution Comments: B wound vacs Restrictions Weight Bearing Restrictions: No      Mobility  Bed Mobility Overal bed mobility: Needs Assistance Bed Mobility: Supine to Sit     Supine to sit: Min assist     General bed mobility comments: minor help to scoot to EOB  Transfers Overall transfer level: Needs assistance Equipment used: 1 person hand held assist Transfers: Sit to/from [facility] Sit to Stand: Min guard Stand pivot transfers: Min assist       General transfer comment: min assist to hold L hand and support the effort to get to the chair, talked with pt about walker being helpful perhaps  Ambulation/Gait              General Gait Details: gait was a sidestep or so to get to chair  Stairs            Wheelchair Mobility    Modified Rankin (Stroke Patients Only)       Balance Overall balance assessment: Needs assistance Sitting-balance support: Feet supported Sitting balance-Leahy Scale: Good     Standing balance support: Bilateral upper extremity supported;During functional activity Standing balance-Leahy Scale: Poor                               Pertinent Vitals/Pain Pain Assessment: 0-10 Pain Score: 5  Pain Location: BLE's, has itching on lateral L leg Pain Descriptors / Indicators: Operative site guarding Pain Intervention(s): Limited activity within patient's tolerance;Monitored during session;Premedicated before session;Repositioned    Home Living Family/patient expects to be discharged to:: Private residence Living Arrangements: Other relatives   Type of Home: House Home Access: Level entry     Home Layout: One level Home Equipment: Environmental consultant - 2 wheels;Cane - single point Additional Comments: pt is expecting to return home if possible    Prior Function Level of Independence: Needs assistance   Gait / Transfers Assistance Needed: ambulates household distances  ADL's / Homemaking Assistance Needed: has family help to handle housework and cooking        Hand Dominance   Dominant Hand: Right    Extremity/Trunk Assessment   Upper Extremity Assessment Upper Extremity Assessment: Overall  WFL for tasks assessed    Lower Extremity Assessment Lower Extremity Assessment: Generalized weakness    Cervical / Trunk Assessment Cervical / Trunk Assessment: Normal  Communication   Communication: No difficulties  Cognition Arousal/Alertness: Awake/alert Behavior During Therapy: WFL for tasks assessed/performed Overall Cognitive Status: Within Functional Limits for tasks assessed                                 General Comments: pt  is motivated to move herself      General Comments General comments (skin integrity, edema, etc.): pt is up in the chair after transfer, declined to have legs elevated and wants to use purwick for urination but is on a diuretic    Exercises     Assessment/Plan    PT Assessment Patient needs continued PT services  PT Problem List Decreased strength;Decreased range of motion;Decreased activity tolerance;Decreased balance;Decreased mobility;Decreased coordination;Cardiopulmonary status limiting activity;Obesity;Decreased skin integrity;Pain       PT Treatment Interventions DME instruction;Gait training;Functional mobility training;Therapeutic activities;Therapeutic exercise;Balance training;Neuromuscular re-education;Patient/family education    PT Goals (Current goals can be found in the Care Plan section)  Acute Rehab PT Goals Patient Stated Goal: to get up and get walking again' PT Goal Formulation: With patient Time For Goal Achievement: 07/30/19 Potential to Achieve Goals: Good    Frequency Min 2X/week   Barriers to discharge Decreased caregiver support has only sister there, will need more help with vacs and weakness    Co-evaluation               AM-PAC PT "6 Clicks" Mobility  Outcome Measure Help needed turning from your back to your side while in a flat bed without using bedrails?: A Little Help needed moving from lying on your back to sitting on the side of a flat bed without using bedrails?: A Little Help needed moving to and from a bed to a chair (including a wheelchair)?: A Little Help needed standing up from a chair using your arms (e.g., wheelchair or bedside chair)?: A Little Help needed to walk in hospital room?: A Lot Help needed climbing 3-5 steps with a railing? : Total 6 Click Score: 15    End of Session Equipment Utilized During Treatment: Gait belt;Other (comment)(B wound vac's) Activity Tolerance: Patient tolerated treatment well;Patient limited  by fatigue;Other (comment)(irritation of legs and weakness) Patient left: in chair;with call bell/phone within reach;with chair alarm set Nurse Communication: Mobility status(talked with them about the reapplication of purwick) PT Visit Diagnosis: Unsteadiness on feet (R26.81);Muscle weakness (generalized) (M62.81)    Time: 3785-8850 PT Time Calculation (min) (ACUTE ONLY): 27 min   Charges:   PT Evaluation $PT Eval Moderate Complexity: 1 Mod PT Treatments $Therapeutic Activity: 8-22 mins       Ramond Dial 07/16/2019, 3:49 PM   Mee Hives, PT MS Acute Rehab Dept. Number: [place] [facility] [place]

## 2019-07-16 NOTE — Care Management (Signed)
CM acknowledges the recommendation for SNF however pt will need 3 inpt night stays for medicare to cover SNF.  TOC will continue to follow for discharge needs

## 2019-07-16 NOTE — Progress Notes (Signed)
PT Cancellation Note  Patient Details Name: Kari [name] MRN: [id number] DOB: [birth year]   Cancelled Treatment:    Reason Eval/Treat Not Completed: Fatigue/lethargy limiting ability to participate.  Asked PT to come back after lunch.  Will try again at another time.   Ramond Dial 07/16/2019, 12:40 PM   Mee Hives, PT MS Acute Rehab Dept. Number: [place] [facility] [place]

## 2019-07-17 ENCOUNTER — Inpatient Hospital Stay (HOSPITAL_COMMUNITY): Payer: Medicare Other | Admitting: Anesthesiology

## 2019-07-17 ENCOUNTER — Encounter (HOSPITAL_COMMUNITY)
Admission: RE | Disposition: A | Discharge status: Rehabilitation Facility | Payer: Self-pay | Source: Home / Self Care | Attending: Orthopedic Surgery

## 2019-07-17 ENCOUNTER — Encounter (HOSPITAL_COMMUNITY): Payer: Self-pay | Admitting: Certified Registered"

## 2019-07-17 DIAGNOSIS — I87313 Chronic venous hypertension (idiopathic) with ulcer of bilateral lower extremity: Secondary | ICD-10-CM

## 2019-07-17 DIAGNOSIS — L97929 Non-pressure chronic ulcer of unspecified part of left lower leg with unspecified severity: Secondary | ICD-10-CM

## 2019-07-17 DIAGNOSIS — L97919 Non-pressure chronic ulcer of unspecified part of right lower leg with unspecified severity: Secondary | ICD-10-CM

## 2019-07-17 HISTORY — PX: SKIN SPLIT GRAFT: SHX444

## 2019-07-17 SURGERY — APPLICATION, GRAFT, SKIN, SPLIT-THICKNESS
Anesthesia: General | Site: Leg Lower | Laterality: Bilateral

## 2019-07-17 MED ORDER — DEXAMETHASONE SODIUM PHOSPHATE 10 MG/ML IJ SOLN
INTRAMUSCULAR | Status: AC
  Filled 2019-07-17: qty 1

## 2019-07-17 MED ORDER — ONDANSETRON HCL 4 MG/2ML IJ SOLN
INTRAMUSCULAR | Status: AC
  Filled 2019-07-17: qty 2

## 2019-07-17 MED ORDER — STERILE WATER FOR IRRIGATION IR SOLN
Status: DC | PRN
  Administered 2019-07-17: 1000 mL

## 2019-07-17 MED ORDER — OXYCODONE HCL 5 MG PO TABS
ORAL_TABLET | ORAL | Status: AC
  Administered 2019-07-17: 10 mg via ORAL
  Filled 2019-07-17: qty 1

## 2019-07-17 MED ORDER — FENTANYL CITRATE (PF) 100 MCG/2ML IJ SOLN
INTRAMUSCULAR | Status: AC
  Filled 2019-07-17: qty 2

## 2019-07-17 MED ORDER — LIDOCAINE 2% (20 MG/ML) 5 ML SYRINGE
INTRAMUSCULAR | Status: AC
  Filled 2019-07-17: qty 5

## 2019-07-17 MED ORDER — PHENYLEPHRINE 40 MCG/ML (10ML) SYRINGE FOR IV PUSH (FOR BLOOD PRESSURE SUPPORT)
PREFILLED_SYRINGE | INTRAVENOUS | Status: DC | PRN
  Administered 2019-07-17: 80 ug via INTRAVENOUS

## 2019-07-17 MED ORDER — 0.9 % SODIUM CHLORIDE (POUR BTL) OPTIME
TOPICAL | Status: DC | PRN
  Administered 2019-07-17 (×3): 1000 mL

## 2019-07-17 MED ORDER — FENTANYL CITRATE (PF) 250 MCG/5ML IJ SOLN
INTRAMUSCULAR | Status: AC
  Filled 2019-07-17: qty 5

## 2019-07-17 MED ORDER — ONDANSETRON HCL 4 MG/2ML IJ SOLN: 4.0000 mg | Freq: Once | INTRAMUSCULAR | Status: DC | PRN

## 2019-07-17 MED ORDER — BISACODYL 10 MG RE SUPP: 10.0000 mg | Freq: Every day | RECTAL | Status: DC | PRN

## 2019-07-17 MED ORDER — GLYCOPYRROLATE PF 0.2 MG/ML IJ SOSY
PREFILLED_SYRINGE | INTRAMUSCULAR | Status: AC
  Filled 2019-07-17: qty 1

## 2019-07-17 MED ORDER — LACTATED RINGERS IV SOLN
INTRAVENOUS | Status: DC
  Administered 2019-07-17: 09:00:00 via INTRAVENOUS

## 2019-07-17 MED ORDER — MIDAZOLAM HCL 2 MG/2ML IJ SOLN
INTRAMUSCULAR | Status: AC
  Filled 2019-07-17: qty 2

## 2019-07-17 MED ORDER — POVIDONE-IODINE 10 % EX SWAB: 2.0000 "application " | Freq: Once | CUTANEOUS | Status: DC

## 2019-07-17 MED ORDER — ONDANSETRON HCL 4 MG/2ML IJ SOLN
INTRAMUSCULAR | Status: DC | PRN
  Administered 2019-07-17: 4 mg via INTRAVENOUS

## 2019-07-17 MED ORDER — LIDOCAINE 2% (20 MG/ML) 5 ML SYRINGE
INTRAMUSCULAR | Status: DC | PRN
  Administered 2019-07-17: 40 mg via INTRAVENOUS
  Administered 2019-07-17: 60 mg via INTRAVENOUS

## 2019-07-17 MED ORDER — MIDAZOLAM HCL 5 MG/5ML IJ SOLN
INTRAMUSCULAR | Status: DC | PRN
  Administered 2019-07-17 (×2): 1 mg via INTRAVENOUS

## 2019-07-17 MED ORDER — METHOCARBAMOL 500 MG PO TABS
500.0000 mg | ORAL_TABLET | Freq: Four times a day (QID) | ORAL | Status: DC | PRN
  Administered 2019-07-17 – 2019-07-19 (×5): 500 mg via ORAL
  Filled 2019-07-17 (×5): qty 1

## 2019-07-17 MED ORDER — POLYETHYLENE GLYCOL 3350 17 G PO PACK
17.0000 g | PACK | Freq: Every day | ORAL | Status: DC | PRN
  Administered 2019-07-19: 17 g via ORAL
  Filled 2019-07-17: qty 1

## 2019-07-17 MED ORDER — LACTATED RINGERS IV SOLN
INTRAVENOUS | Status: DC | PRN
  Administered 2019-07-17: 10:00:00 via INTRAVENOUS

## 2019-07-17 MED ORDER — METHOCARBAMOL 1000 MG/10ML IJ SOLN
500.0000 mg | Freq: Four times a day (QID) | INTRAVENOUS | Status: DC | PRN
  Filled 2019-07-17: qty 5

## 2019-07-17 MED ORDER — SUCCINYLCHOLINE CHLORIDE 200 MG/10ML IV SOSY
PREFILLED_SYRINGE | INTRAVENOUS | Status: AC
  Filled 2019-07-17: qty 10

## 2019-07-17 MED ORDER — DEXAMETHASONE SODIUM PHOSPHATE 4 MG/ML IJ SOLN
INTRAMUSCULAR | Status: DC | PRN
  Administered 2019-07-17: 6 mg via INTRAVENOUS

## 2019-07-17 MED ORDER — DEXTROSE 5 % IV SOLN: 3.0000 g | INTRAVENOUS | Status: DC

## 2019-07-17 MED ORDER — FENTANYL CITRATE (PF) 100 MCG/2ML IJ SOLN
INTRAMUSCULAR | Status: DC | PRN
  Administered 2019-07-17 (×4): 25 ug via INTRAVENOUS

## 2019-07-17 MED ORDER — FENTANYL CITRATE (PF) 100 MCG/2ML IJ SOLN
25.0000 ug | INTRAMUSCULAR | Status: DC | PRN
  Administered 2019-07-17 (×2): 50 ug via INTRAVENOUS

## 2019-07-17 MED ORDER — GLYCOPYRROLATE PF 0.2 MG/ML IJ SOSY
PREFILLED_SYRINGE | INTRAMUSCULAR | Status: DC | PRN
  Administered 2019-07-17: .1 mg via INTRAVENOUS

## 2019-07-17 MED ORDER — PHENYLEPHRINE 40 MCG/ML (10ML) SYRINGE FOR IV PUSH (FOR BLOOD PRESSURE SUPPORT)
PREFILLED_SYRINGE | INTRAVENOUS | Status: AC
  Filled 2019-07-17: qty 10

## 2019-07-17 MED ORDER — OXYCODONE HCL 5 MG/5ML PO SOLN: 5.0000 mg | Freq: Once | ORAL | Status: AC | PRN

## 2019-07-17 MED ORDER — METOCLOPRAMIDE HCL 5 MG PO TABS: 5.0000 mg | ORAL_TABLET | Freq: Three times a day (TID) | ORAL | Status: DC | PRN

## 2019-07-17 MED ORDER — SODIUM CHLORIDE 0.9 % IV SOLN
INTRAVENOUS | Status: DC
  Administered 2019-07-17 – 2019-07-19 (×2): via INTRAVENOUS

## 2019-07-17 MED ORDER — OXYCODONE HCL 5 MG PO TABS
5.0000 mg | ORAL_TABLET | Freq: Once | ORAL | Status: AC | PRN
  Administered 2019-07-17: 5 mg via ORAL

## 2019-07-17 MED ORDER — CHLORHEXIDINE GLUCONATE 4 % EX LIQD
60.0000 mL | Freq: Once | CUTANEOUS | Status: DC
  Administered 2019-07-17: 4 via TOPICAL

## 2019-07-17 MED ORDER — MAGNESIUM CITRATE PO SOLN: 1.0000 | Freq: Once | ORAL | Status: DC | PRN

## 2019-07-17 MED ORDER — DOCUSATE SODIUM 100 MG PO CAPS
100.0000 mg | ORAL_CAPSULE | Freq: Two times a day (BID) | ORAL | Status: DC
  Administered 2019-07-17 – 2019-07-20 (×6): 100 mg via ORAL
  Filled 2019-07-17 (×6): qty 1

## 2019-07-17 MED ORDER — ONDANSETRON HCL 4 MG/2ML IJ SOLN: 4.0000 mg | Freq: Four times a day (QID) | INTRAMUSCULAR | Status: DC | PRN

## 2019-07-17 MED ORDER — ONDANSETRON HCL 4 MG PO TABS: 4.0000 mg | ORAL_TABLET | Freq: Four times a day (QID) | ORAL | Status: DC | PRN

## 2019-07-17 MED ORDER — PROPOFOL 10 MG/ML IV BOLUS
INTRAVENOUS | Status: DC | PRN
  Administered 2019-07-17: 150 mg via INTRAVENOUS

## 2019-07-17 MED ORDER — METOCLOPRAMIDE HCL 5 MG/ML IJ SOLN: 5.0000 mg | Freq: Three times a day (TID) | INTRAMUSCULAR | Status: DC | PRN

## 2019-07-17 MED ORDER — CEFAZOLIN SODIUM-DEXTROSE 2-4 GM/100ML-% IV SOLN
2.0000 g | Freq: Three times a day (TID) | INTRAVENOUS | Status: DC
  Administered 2019-07-17 – 2019-07-20 (×8): 2 g via INTRAVENOUS
  Filled 2019-07-17 (×9): qty 100

## 2019-07-17 MED ORDER — ENSURE PRE-SURGERY PO LIQD: 296.0000 mL | Freq: Once | ORAL | Status: DC

## 2019-07-17 MED ORDER — PROPOFOL 10 MG/ML IV BOLUS
INTRAVENOUS | Status: AC
  Filled 2019-07-17: qty 20

## 2019-07-17 SURGICAL SUPPLY — 49 items
ALLOGRAFT SKIN MESHD 387 SQ CM (Graft) ×2 IMPLANT
BNDG COHESIVE 6X5 TAN STRL LF (GAUZE/BANDAGES/DRESSINGS) IMPLANT
BNDG ESMARK 4X9 LF (GAUZE/BANDAGES/DRESSINGS) ×3 IMPLANT
BNDG GAUZE STRTCH 6 (GAUZE/BANDAGES/DRESSINGS) IMPLANT
CANISTER WOUNDNEG PRESSURE 500 (CANNISTER) ×4 IMPLANT
COVER SURGICAL LIGHT HANDLE (MISCELLANEOUS) ×6 IMPLANT
COVER WAND RF STERILE (DRAPES) ×3 IMPLANT
CUFF TOURN SGL QUICK 18X4 (TOURNIQUET CUFF) IMPLANT
CUFF TOURN SGL QUICK 24 (TOURNIQUET CUFF)
CUFF TRNQT CYL 24X4X16.5-23 (TOURNIQUET CUFF) IMPLANT
DERMACARRIERS GRAFT 1 TO 1.5 (DISPOSABLE)
DRAPE U-SHAPE 47X51 STRL (DRAPES) ×3 IMPLANT
DRESSING VERAFLO CLEANSE CC (GAUZE/BANDAGES/DRESSINGS) IMPLANT
DRSG ADAPTIC 3X8 NADH LF (GAUZE/BANDAGES/DRESSINGS) IMPLANT
DRSG MEPITEL 4X7.2 (GAUZE/BANDAGES/DRESSINGS) ×3 IMPLANT
DRSG VERAFLO CLEANSE CC (GAUZE/BANDAGES/DRESSINGS) ×6
DURAPREP 26ML APPLICATOR (WOUND CARE) ×3 IMPLANT
ELECT REM PT RETURN 9FT ADLT (ELECTROSURGICAL) ×3
ELECTRODE REM PT RTRN 9FT ADLT (ELECTROSURGICAL) ×1 IMPLANT
GAUZE SPONGE 4X4 12PLY STRL (GAUZE/BANDAGES/DRESSINGS) IMPLANT
GLOVE BIOGEL PI IND STRL 9 (GLOVE) ×1 IMPLANT
GLOVE BIOGEL PI INDICATOR 9 (GLOVE) ×2
GLOVE SURG ORTHO 9.0 STRL STRW (GLOVE) ×3 IMPLANT
GOWN STRL REUS W/ TWL XL LVL3 (GOWN DISPOSABLE) ×2 IMPLANT
GOWN STRL REUS W/TWL XL LVL3 (GOWN DISPOSABLE) ×4
GRAFT DERMACARRIERS 1 TO 1.5 (DISPOSABLE) IMPLANT
GRAFT TISS MESH 387 BURN (Graft) IMPLANT
KIT BASIN OR (CUSTOM PROCEDURE TRAY) ×3 IMPLANT
KIT TURNOVER KIT B (KITS) ×3 IMPLANT
MANIFOLD NEPTUNE II (INSTRUMENTS) ×3 IMPLANT
NDL HYPO 25GX1X1/2 BEV (NEEDLE) IMPLANT
NEEDLE HYPO 25GX1X1/2 BEV (NEEDLE) IMPLANT
NS IRRIG 1000ML POUR BTL (IV SOLUTION) ×3 IMPLANT
PACK ORTHO EXTREMITY (CUSTOM PROCEDURE TRAY) ×3 IMPLANT
PAD ARMBOARD 7.5X6 YLW CONV (MISCELLANEOUS) ×6 IMPLANT
PAD CAST 4YDX4 CTTN HI CHSV (CAST SUPPLIES) IMPLANT
PAD NEG PRESSURE SENSATRAC (MISCELLANEOUS) ×4 IMPLANT
PADDING CAST COTTON 4X4 STRL (CAST SUPPLIES)
SKIN MESHED 387 SQ CM (Graft) ×6 IMPLANT
STAPLER VISISTAT 35W (STAPLE) ×8 IMPLANT
SUCTION FRAZIER HANDLE 10FR (MISCELLANEOUS)
SUCTION TUBE FRAZIER 10FR DISP (MISCELLANEOUS) IMPLANT
SUT ETHILON 4 0 PS 2 18 (SUTURE) IMPLANT
SYR CONTROL 10ML LL (SYRINGE) IMPLANT
TOWEL GREEN STERILE (TOWEL DISPOSABLE) ×3 IMPLANT
TOWEL GREEN STERILE FF (TOWEL DISPOSABLE) ×3 IMPLANT
TUBE CONNECTING 12'X1/4 (SUCTIONS)
TUBE CONNECTING 12X1/4 (SUCTIONS) IMPLANT
WATER STERILE IRR 1000ML POUR (IV SOLUTION) ×3 IMPLANT

## 2019-07-17 NOTE — Anesthesia Procedure Notes (Signed)
Procedure Name: LMA Insertion Date/Time: 07/17/2019 9:57 AM Performed by: Orlie Dakin, CRNA Pre-anesthesia Checklist: Patient identified, Emergency Drugs available, Suction available and Patient being monitored Patient Re-evaluated:Patient Re-evaluated prior to induction Oxygen Delivery Method: Circle system utilized Preoxygenation: Pre-oxygenation with 100% oxygen Induction Type: IV induction LMA: LMA inserted LMA Size: 4.0 Tube type: Oral Number of attempts: 1 Placement Confirmation: positive ETCO2 Tube secured with: Tape Dental Injury: Teeth and Oropharynx as per pre-operative assessment

## 2019-07-17 NOTE — Op Note (Signed)
07/17/2019  10:38 AM  PATIENT:  Kari [name]    PRE-OPERATIVE DIAGNOSIS:  Bilateral Lower Extremity Venous Ulcers  POST-OPERATIVE DIAGNOSIS:  Same  PROCEDURE:  REPEAT IRRIGATION AND DEBRIDEMENT BILATERAL LOWER EXTREMITIES, SKIN GRAFT Application of allograft skin graft x2 each graft 387 cm. Application of cleanse choice reticulated and solid foam to both lower extremities to large foam each  SURGEON:  [name], MD  PHYSICIAN ASSISTANT:None ANESTHESIA:   General  PREOPERATIVE INDICATIONS:  [name] is a  58 y.o. female with a diagnosis of Bilateral Lower Extremity Venous Ulcers who failed conservative measures and elected for surgical management.    The risks benefits and alternatives were discussed with the patient preoperatively including but not limited to the risks of infection, bleeding, nerve injury, cardiopulmonary complications, the need for revision surgery, among others, and the patient was willing to proceed.  OPERATIVE IMPLANTS: Allograft split-thickness skin graft for each wound 400 cm.  @ENCIMAGES @  OPERATIVE FINDINGS: Good granulation tissue good wrinkling of the skin significant improvement in both lower extremities.  OPERATIVE PROCEDURE: Patient was brought the operating room underwent a general anesthetic.  After adequate levels anesthesia were obtained patient's both lower extremities were prepped using Betadine paint and draped into a sterile field a timeout was called.  A osteotome was used to debride skin and soft tissue and prepared the wound bed.  There is excellent granulation tissue no necrotic tissue no exposed bone or tendons no cellulitis of deep infection.  I corrected the patient there was good wrinkling of the skin.  After debridement of both legs with an osteotome with excision and wound bed preparation both legs were irrigated with normal saline each wound measured 400 cm.  The allograft split-thickness skin graft was applied to each  leg each unit was 387 cm.  This was secured with staples.  The reticulated foam followed by the solid foam was applied to each skin graft covered with Ioban this had a good suction fit the entire leg was then wrapped in Coban.  Patient was extubated taken the PACU in stable condition.  There is 2 checks on each of the wound VAC canisters with good suction.   DISCHARGE PLANNING:  Antibiotic duration: Continue IV antibiotics for 3 days plan for discharge on Monday  Weightbearing: Weightbearing as tolerated  Pain medication: Continue current pain medication.  Dressing care/ Wound VAC: Continue wound vacs for 1 week discharge with the Praveena portable wound VAC pump  Ambulatory devices: Walker  Discharge to: Home on Monday  Follow-up: In the office 1 week post operative.

## 2019-07-17 NOTE — Transfer of Care (Signed)
Immediate Anesthesia Transfer of Care Note  Patient: Kari [name]  Procedure(s) Performed: REPEAT IRRIGATION AND DEBRIDEMENT BILATERAL LOWER EXTREMITIES, SKIN GRAFT (Bilateral Leg Lower)  Patient Location: PACU  Anesthesia Type:General  Level of Consciousness: drowsy  Airway & Oxygen Therapy: Patient Spontanous Breathing and Patient connected to face mask oxygen  Post-op Assessment: Report given to RN and Post -op Vital signs reviewed and stable  Post vital signs: Reviewed and stable  Last Vitals:  Vitals Value Taken Time  BP    Temp 36.1 C 07/17/19 1031  Pulse 79 07/17/19 1033  Resp 22 07/17/19 1033  SpO2 100 % 07/17/19 1033  Vitals shown include unvalidated device data.  Last Pain:  Vitals:   07/17/19 0823  TempSrc: Oral  PainSc:       Patients Stated Pain Goal: 1 (14/43/15 4008)  Complications: No apparent anesthesia complications

## 2019-07-17 NOTE — Anesthesia Preprocedure Evaluation (Addendum)
Anesthesia Evaluation  Patient identified by MRN, date of birth, ID band Patient awake    Reviewed: Allergy & Precautions, NPO status , Patient's Chart, lab work & pertinent test results, reviewed documented beta blocker date and time   History of Anesthesia Complications Negative for: history of anesthetic complications  Airway Mallampati: II  TM Distance: >3 FB Neck ROM: Full    Dental  (+) Edentulous Lower, Edentulous Upper   Pulmonary asthma ,   Sarcoidosis    breath sounds clear to auscultation       Cardiovascular hypertension, Pt. on medications and Pt. on home beta blockers + Past MI and +CHF  + Cardiac Defibrillator + Valvular Problems/Murmurs MR  Rhythm:Regular Rate:Normal   '18 Cath - Left dominant coronary anatomy. Normal coronary arteries. Mild to moderate global left ventricular hypo-kinesis with estimated EF 35-40%. LV end-diastolic pressure is up and normal. Combined systolic and diastolic heart failure, chronic  '18 TTE - EF 40-45%. Diffuse hypokinesis worse in the inferior basal wall. LV cavity size was moderately dilated. Moderate LVH. Mild AI. Moderate MR. Moderately dilated LA.     Neuro/Psych  Headaches, PSYCHIATRIC DISORDERS Depression    GI/Hepatic negative GI ROS, Neg liver ROS,   Endo/Other  Morbid obesity  Renal/GU CRFRenal disease     Musculoskeletal  (+) Arthritis ,   Abdominal   Peds  Hematology  (+) anemia ,   Anesthesia Other Findings   Reproductive/Obstetrics                            Anesthesia Physical Anesthesia Plan  ASA: IV  Anesthesia Plan: General   Post-op Pain Management:    Induction: Intravenous  PONV Risk Score and Plan: 3 and Treatment may vary due to age or medical condition, Ondansetron, Dexamethasone and Midazolam  Airway Management Planned: LMA  Additional Equipment: None  Intra-op Plan:   Post-operative Plan:  Extubation in OR  Informed Consent: I have reviewed the patients History and Physical, chart, labs and discussed the procedure including the risks, benefits and alternatives for the proposed anesthesia with the patient or authorized representative who has indicated his/her understanding and acceptance.     Dental advisory given  Plan Discussed with: CRNA and Anesthesiologist  Anesthesia Plan Comments:        Anesthesia Quick Evaluation

## 2019-07-17 NOTE — Anesthesia Postprocedure Evaluation (Signed)
Anesthesia Post Note  Patient: PAYAL STANFORTH  Procedure(s) Performed: REPEAT IRRIGATION AND DEBRIDEMENT BILATERAL LOWER EXTREMITIES, SKIN GRAFT (Bilateral Leg Lower)     Patient location during evaluation: PACU Anesthesia Type: General Level of consciousness: awake and alert Pain management: pain level controlled Vital Signs Assessment: post-procedure vital signs reviewed and stable Respiratory status: spontaneous breathing, nonlabored ventilation and respiratory function stable Cardiovascular status: blood pressure returned to baseline and stable Postop Assessment: no apparent nausea or vomiting Anesthetic complications: no    Last Vitals:  Vitals:   07/17/19 1102 07/17/19 1117  BP: 134/89 124/84  Pulse: 72 76  Resp: 20 16  Temp:    SpO2: 91% 97%                   Audry Pili

## 2019-07-17 NOTE — Interval H&P Note (Signed)
History and Physical Interval Note:  07/17/2019 7:05 AM  [place]  has presented today for surgery, with the diagnosis of Bilateral Lower Extremity Venous Ulcers.  The various methods of treatment have been discussed with the patient and family. After consideration of risks, benefits and other options for treatment, the patient has consented to  Procedure(s): REPEAT IRRIGATION AND DEBRIDEMENT BILATERAL LOWER EXTREMITIES, SKIN GRAFT (Bilateral) as a surgical intervention.  The patient's history has been reviewed, patient examined, no change in status, stable for surgery.  I have reviewed the patient's chart and labs.  Questions were answered to the patient's satisfaction.     Kari [name]

## 2019-07-17 NOTE — Progress Notes (Signed)
Report was given to [facility], Therapist, sports in [employer]  The patient is ready for the OR.  The patient is alert and oriented.

## 2019-07-18 NOTE — Plan of Care (Signed)
  Problem: Education: Goal: Knowledge of General Education information will improve Description: Including pain rating scale, medication(s)/side effects and non-pharmacologic comfort measures Outcome: Progressing   Problem: Health Behavior/Discharge Planning: Goal: Ability to manage health-related needs will improve Outcome: Progressing   Problem: Clinical Measurements: Goal: Will remain free from infection Outcome: Progressing   Problem: Activity: Goal: Risk for activity intolerance will decrease Outcome: Progressing   Problem: Safety: Goal: Ability to remain free from injury will improve Outcome: Progressing   Problem: Skin Integrity: Goal: Risk for impaired skin integrity will decrease Outcome: Progressing

## 2019-07-18 NOTE — Progress Notes (Signed)
Patient ID: Kari [name], female   DOB: 03/02/1961, 58 y.o.   MRN: [id number] Postoperative day 1 split-thickness skin graft to both legs.  Patient had excellent granulation tissue.  There is about 50 cc of drainage in both wound VAC canisters.  Anticipate discharge to home on Monday.

## 2019-07-18 NOTE — Plan of Care (Signed)
  Problem: Education: Goal: Knowledge of General Education information will improve Description Including pain rating scale, medication(s)/side effects and non-pharmacologic comfort measures Outcome: Progressing   

## 2019-07-19 NOTE — Progress Notes (Signed)
Physical Therapy Treatment Patient Details Name: Kari [name] MRN: [id number] DOB: May 19, 1961 Today's Date: 07/19/2019    History of Present Illness 58 yo female with onset of BLE debridement for venous stasis ulcers was referred to PT.  Had skin grafting in Jan 2020, then compression wraps.  Now has wound vacs in place, WBAT.  PMHx;  chronic venous stasis ulcers LE's, skin grafting LE's, HA, transfusion, renal disorder, sarcoidosis, lung nodules, CHF, EF 40-45%, HTN, MI, depression, asthma, OA    PT Comments    Continuing work on functional mobility and activity tolerance;  Notable progress with activity tolerance, and gait distance;  Still with heavy dependence on momentum with sit<>stand, and for support during gait; We discussed possibly discharging tomorrow, and she very much wants to get home; I am concerned for ADLs as well, and placed an OT consult per protocol; Kari [name] is refusing SNF -- I belive it is worth considering CIR for post-acute rehab to maximize independence and safety with mobility; I have been in contact with CIR Admissions Coordinator  Follow Up Recommendations  CIR;Other (comment)(if CIR is not an option, consider [facility])     Equipment Recommendations  Rolling walker with 5" wheels;3in1 (PT);Other (comment)(Bariatrics)    Recommendations for Other Services OT consult(ordered per protocol)     Precautions / Restrictions Precautions Precautions: Fall Precaution Comments: B wound vacs Restrictions Weight Bearing Restrictions: Yes RLE Weight Bearing: Weight bearing as tolerated LLE Weight Bearing: Weight bearing as tolerated    Mobility  Bed Mobility Overal bed mobility: Needs Assistance Bed Mobility: Supine to Sit     Supine to sit: Min assist     General bed mobility comments: minor help to scoot to EOB  Transfers Overall transfer level: Needs assistance Equipment used: Rolling walker (2 wheeled) Transfers: Sit to/from  Stand Sit to Stand: Min assist;+2 safety/equipment         General transfer comment: Stood to [employer] with heavy dependence on momentum and UEs pushing on RW for support and to push up to get to fully upright standing  Ambulation/Gait Ambulation/Gait assistance: Mod assist;+2 safety/equipment Gait Distance (Feet): 12 Feet Assistive device: Rolling walker (2 wheeled) Gait Pattern/deviations: Wide base of support;Decreased step length - right;Decreased step length - left;Trunk flexed Gait velocity: quite slow   General Gait Details: Dependent on UE support; cues to self-monitor for activity tolerance   Stairs             Wheelchair Mobility    Modified Rankin (Stroke Patients Only)       Balance     Sitting balance-Leahy Scale: Good       Standing balance-Leahy Scale: Poor                              Cognition Arousal/Alertness: Awake/alert Behavior During Therapy: WFL for tasks assessed/performed Overall Cognitive Status: Within Functional Limits for tasks assessed                                 General Comments: pt is motivated to move herself      Exercises      General Comments        Pertinent Vitals/Pain Pain Assessment: 0-10 Pain Score: 8  Pain Location: [place] Legs Pain Descriptors / Indicators: Operative site guarding Pain Intervention(s): Monitored during session    Home Living  Prior Function            PT Goals (current goals can now be found in the care plan section) Acute Rehab PT Goals Patient Stated Goal: to get up and get walking again; she indicated she does not want to go SNF PT Goal Formulation: With patient Time For Goal Achievement: 07/30/19 Potential to Achieve Goals: Good Progress towards PT goals: Progressing toward goals    Frequency    Min 3X/week      PT Plan Discharge plan needs to be updated;Frequency needs to be updated;Other (comment)(Refusing  SNF)    Co-evaluation              AM-PAC PT "6 Clicks" Mobility   Outcome Measure  Help needed turning from your back to your side while in a flat bed without using bedrails?: A Little Help needed moving from lying on your back to sitting on the side of a flat bed without using bedrails?: A Little Help needed moving to and from a bed to a chair (including a wheelchair)?: A Lot Help needed standing up from a chair using your arms (e.g., wheelchair or bedside chair)?: A Lot Help needed to walk in hospital room?: A Little Help needed climbing 3-5 steps with a railing? : Total 6 Click Score: 14    End of Session Equipment Utilized During Treatment: Gait belt;Other (comment)(B wound vac's) Activity Tolerance: Patient tolerated treatment well;Patient limited by fatigue Patient left: in chair;with call bell/phone within reach;with chair alarm set Nurse Communication: Mobility status PT Visit Diagnosis: Unsteadiness on feet (R26.81);Muscle weakness (generalized) (M62.81)     Time: 1032-1100 PT Time Calculation (min) (ACUTE ONLY): 28 min  Charges:  $Gait Training: 8-22 mins $Therapeutic Activity: 8-22 mins                     Roney Marion, PT  Acute Rehabilitation Services Pager [phone] Office [place] 07/19/2019, 1:54 PM

## 2019-07-19 NOTE — Progress Notes (Signed)
Inpatient Rehabilitation Admissions Coordinator  If pt would like to be considered for an inpt rehab admission, please place an inpt rehab consult. Please advise.  Danne Baxter, RN, MSN Rehab Admissions Coordinator [phone] 07/19/2019 2:13 PM

## 2019-07-19 NOTE — Progress Notes (Signed)
   Subjective: 2 Days Post-Op Procedure(s) (LRB): REPEAT IRRIGATION AND DEBRIDEMENT BILATERAL LOWER EXTREMITIES, SKIN GRAFT (Bilateral) Patient reports pain as mild.    Objective: Vital signs in last 24 hours: Temp:  [98.5 F (36.9 C)-98.8 F (37.1 C)] 98.8 F (37.1 C) (07/26 0747) Pulse Rate:  [63-75] 70 (07/26 0747) Resp:  [14-17] 17 (07/26 0747) BP: (102-114)/(54-74) 102/55 (07/26 0747) SpO2:  [91 %-100 %] 95 % (07/26 0747)  Intake/Output from previous day: 07/25 0701 - 07/26 0700 In: 942 [P.O.:942] Out: 2900 [Urine:2900] Intake/Output this shift: No intake/output data recorded.  No results for input(s): HGB in the last 72 hours. No results for input(s): WBC, RBC, HCT, PLT in the last 72 hours. No results for input(s): NA, K, CL, CO2, BUN, CREATININE, GLUCOSE, CALCIUM in the last 72 hours. No results for input(s): LABPT, INR in the last 72 hours.  no drainage thru coban. good VAC seal.  No results found.  Assessment/Plan: 2 Days Post-Op Procedure(s) (LRB): REPEAT IRRIGATION AND DEBRIDEMENT BILATERAL LOWER EXTREMITIES, SKIN GRAFT (Bilateral) Plan:  Possible home Monday per Dr. Sharol Given past note. Pt comfortable.   Marybelle Killings 07/19/2019, 9:42 AM

## 2019-07-19 NOTE — Plan of Care (Signed)
  Problem: Pain Managment: Goal: General experience of comfort will improve Outcome: Progressing   Problem: Safety: Goal: Ability to remain free from injury will improve Outcome: Progressing   Problem: Skin Integrity: Goal: Risk for impaired skin integrity will decrease Outcome: Progressing   

## 2019-07-19 NOTE — Plan of Care (Signed)
  Problem: Education: Goal: Knowledge of General Education information will improve Description Including pain rating scale, medication(s)/side effects and non-pharmacologic comfort measures Outcome: Progressing   

## 2019-07-20 ENCOUNTER — Other Ambulatory Visit: Payer: Self-pay

## 2019-07-20 ENCOUNTER — Encounter (HOSPITAL_COMMUNITY): Payer: Self-pay | Admitting: Orthopedic Surgery

## 2019-07-20 ENCOUNTER — Inpatient Hospital Stay (HOSPITAL_COMMUNITY)
Admission: RE | Admit: 2019-07-20 | Discharge: 2019-07-25 | DRG: 948 | Disposition: A | Discharge status: Home Health | Payer: Medicare Other | Source: Intra-hospital | Attending: Physical Medicine & Rehabilitation | Admitting: Physical Medicine & Rehabilitation

## 2019-07-20 DIAGNOSIS — I959 Hypotension, unspecified: Secondary | ICD-10-CM | POA: Diagnosis not present

## 2019-07-20 DIAGNOSIS — I252 Old myocardial infarction: Secondary | ICD-10-CM | POA: Diagnosis not present

## 2019-07-20 DIAGNOSIS — D869 Sarcoidosis, unspecified: Secondary | ICD-10-CM | POA: Diagnosis present

## 2019-07-20 DIAGNOSIS — Z809 Family history of malignant neoplasm, unspecified: Secondary | ICD-10-CM

## 2019-07-20 DIAGNOSIS — R0989 Other specified symptoms and signs involving the circulatory and respiratory systems: Secondary | ICD-10-CM | POA: Diagnosis not present

## 2019-07-20 DIAGNOSIS — I428 Other cardiomyopathies: Secondary | ICD-10-CM

## 2019-07-20 DIAGNOSIS — D631 Anemia in chronic kidney disease: Secondary | ICD-10-CM | POA: Diagnosis present

## 2019-07-20 DIAGNOSIS — N189 Chronic kidney disease, unspecified: Secondary | ICD-10-CM | POA: Diagnosis present

## 2019-07-20 DIAGNOSIS — Z885 Allergy status to narcotic agent status: Secondary | ICD-10-CM

## 2019-07-20 DIAGNOSIS — I10 Essential (primary) hypertension: Secondary | ICD-10-CM | POA: Diagnosis present

## 2019-07-20 DIAGNOSIS — E876 Hypokalemia: Secondary | ICD-10-CM | POA: Diagnosis not present

## 2019-07-20 DIAGNOSIS — G8918 Other acute postprocedural pain: Secondary | ICD-10-CM | POA: Diagnosis not present

## 2019-07-20 DIAGNOSIS — Z823 Family history of stroke: Secondary | ICD-10-CM

## 2019-07-20 DIAGNOSIS — Z8249 Family history of ischemic heart disease and other diseases of the circulatory system: Secondary | ICD-10-CM

## 2019-07-20 DIAGNOSIS — Z95810 Presence of automatic (implantable) cardiac defibrillator: Secondary | ICD-10-CM

## 2019-07-20 DIAGNOSIS — L97929 Non-pressure chronic ulcer of unspecified part of left lower leg with unspecified severity: Secondary | ICD-10-CM | POA: Diagnosis present

## 2019-07-20 DIAGNOSIS — Z833 Family history of diabetes mellitus: Secondary | ICD-10-CM | POA: Diagnosis not present

## 2019-07-20 DIAGNOSIS — I509 Heart failure, unspecified: Secondary | ICD-10-CM | POA: Diagnosis present

## 2019-07-20 DIAGNOSIS — I130 Hypertensive heart and chronic kidney disease with heart failure and stage 1 through stage 4 chronic kidney disease, or unspecified chronic kidney disease: Secondary | ICD-10-CM | POA: Diagnosis present

## 2019-07-20 DIAGNOSIS — E785 Hyperlipidemia, unspecified: Secondary | ICD-10-CM | POA: Diagnosis present

## 2019-07-20 DIAGNOSIS — R5381 Other malaise: Principal | ICD-10-CM | POA: Diagnosis present

## 2019-07-20 DIAGNOSIS — Z7982 Long term (current) use of aspirin: Secondary | ICD-10-CM | POA: Diagnosis not present

## 2019-07-20 DIAGNOSIS — Z6841 Body Mass Index (BMI) 40.0 and over, adult: Secondary | ICD-10-CM

## 2019-07-20 DIAGNOSIS — D62 Acute posthemorrhagic anemia: Secondary | ICD-10-CM | POA: Diagnosis present

## 2019-07-20 DIAGNOSIS — L97912 Non-pressure chronic ulcer of unspecified part of right lower leg with fat layer exposed: Secondary | ICD-10-CM

## 2019-07-20 DIAGNOSIS — D638 Anemia in other chronic diseases classified elsewhere: Secondary | ICD-10-CM | POA: Diagnosis not present

## 2019-07-20 DIAGNOSIS — M1990 Unspecified osteoarthritis, unspecified site: Secondary | ICD-10-CM | POA: Diagnosis present

## 2019-07-20 DIAGNOSIS — L97922 Non-pressure chronic ulcer of unspecified part of left lower leg with fat layer exposed: Secondary | ICD-10-CM

## 2019-07-20 DIAGNOSIS — L97919 Non-pressure chronic ulcer of unspecified part of right lower leg with unspecified severity: Secondary | ICD-10-CM | POA: Diagnosis present

## 2019-07-20 DIAGNOSIS — N183 Chronic kidney disease, stage 3 (moderate): Secondary | ICD-10-CM | POA: Diagnosis not present

## 2019-07-20 DIAGNOSIS — I952 Hypotension due to drugs: Secondary | ICD-10-CM | POA: Diagnosis not present

## 2019-07-20 MED ORDER — DOCUSATE SODIUM 100 MG PO CAPS
100.0000 mg | ORAL_CAPSULE | Freq: Two times a day (BID) | ORAL | Status: DC
  Administered 2019-07-20 – 2019-07-25 (×10): 100 mg via ORAL
  Filled 2019-07-20 (×10): qty 1

## 2019-07-20 MED ORDER — TRAZODONE HCL 50 MG PO TABS
25.0000 mg | ORAL_TABLET | Freq: Every evening | ORAL | Status: DC | PRN
  Administered 2019-07-24: 50 mg via ORAL
  Filled 2019-07-20: qty 1

## 2019-07-20 MED ORDER — PROCHLORPERAZINE MALEATE 5 MG PO TABS: 5.0000 mg | ORAL_TABLET | Freq: Four times a day (QID) | ORAL | Status: DC | PRN

## 2019-07-20 MED ORDER — BISACODYL 10 MG RE SUPP: 10.0000 mg | Freq: Every day | RECTAL | Status: DC | PRN

## 2019-07-20 MED ORDER — FUROSEMIDE 40 MG PO TABS
60.0000 mg | ORAL_TABLET | Freq: Every day | ORAL | Status: DC
  Administered 2019-07-21 – 2019-07-25 (×5): 60 mg via ORAL
  Filled 2019-07-20 (×5): qty 1

## 2019-07-20 MED ORDER — ENOXAPARIN SODIUM 60 MG/0.6ML ~~LOC~~ SOLN
60.0000 mg | SUBCUTANEOUS | Status: DC
  Administered 2019-07-20 – 2019-07-22 (×3): 60 mg via SUBCUTANEOUS
  Filled 2019-07-20 (×3): qty 0.6

## 2019-07-20 MED ORDER — OXYCODONE HCL 5 MG PO TABS
5.0000 mg | ORAL_TABLET | ORAL | Status: DC | PRN
  Administered 2019-07-20: 10 mg via ORAL
  Administered 2019-07-20: 5 mg via ORAL
  Filled 2019-07-20 (×3): qty 2

## 2019-07-20 MED ORDER — ALBUTEROL SULFATE (2.5 MG/3ML) 0.083% IN NEBU: 2.5000 mg | INHALATION_SOLUTION | Freq: Four times a day (QID) | RESPIRATORY_TRACT | Status: DC | PRN

## 2019-07-20 MED ORDER — PRO-STAT SUGAR FREE PO LIQD
30.0000 mL | Freq: Two times a day (BID) | ORAL | Status: DC
  Administered 2019-07-20 – 2019-07-25 (×10): 30 mL via ORAL
  Filled 2019-07-20 (×10): qty 30

## 2019-07-20 MED ORDER — POLYETHYLENE GLYCOL 3350 17 G PO PACK: 17.0000 g | PACK | Freq: Every day | ORAL | Status: DC | PRN

## 2019-07-20 MED ORDER — ALLOPURINOL 100 MG PO TABS
100.0000 mg | ORAL_TABLET | Freq: Two times a day (BID) | ORAL | Status: DC
  Administered 2019-07-20 – 2019-07-25 (×10): 100 mg via ORAL
  Filled 2019-07-20 (×10): qty 1

## 2019-07-20 MED ORDER — DIPHENHYDRAMINE HCL 12.5 MG/5ML PO ELIX: 12.5000 mg | ORAL_SOLUTION | Freq: Four times a day (QID) | ORAL | Status: DC | PRN

## 2019-07-20 MED ORDER — PROCHLORPERAZINE 25 MG RE SUPP: 12.5000 mg | Freq: Four times a day (QID) | RECTAL | Status: DC | PRN

## 2019-07-20 MED ORDER — OXYCODONE HCL 5 MG PO TABS
10.0000 mg | ORAL_TABLET | ORAL | Status: DC | PRN
  Administered 2019-07-21: 15 mg via ORAL
  Administered 2019-07-21: 10 mg via ORAL
  Administered 2019-07-21 – 2019-07-22 (×4): 15 mg via ORAL
  Administered 2019-07-23: 10 mg via ORAL
  Filled 2019-07-20 (×3): qty 3
  Filled 2019-07-20: qty 2
  Filled 2019-07-20 (×2): qty 3

## 2019-07-20 MED ORDER — ASPIRIN EC 81 MG PO TBEC
81.0000 mg | DELAYED_RELEASE_TABLET | Freq: Every day | ORAL | Status: DC
  Administered 2019-07-21 – 2019-07-25 (×5): 81 mg via ORAL
  Filled 2019-07-20 (×5): qty 1

## 2019-07-20 MED ORDER — ALUM & MAG HYDROXIDE-SIMETH 200-200-20 MG/5ML PO SUSP: 30.0000 mL | ORAL | Status: DC | PRN

## 2019-07-20 MED ORDER — ACETAMINOPHEN 325 MG PO TABS
325.0000 mg | ORAL_TABLET | ORAL | Status: DC | PRN
  Administered 2019-07-23: 325 mg via ORAL
  Filled 2019-07-20: qty 2

## 2019-07-20 MED ORDER — METHOCARBAMOL 500 MG PO TABS
500.0000 mg | ORAL_TABLET | Freq: Four times a day (QID) | ORAL | Status: DC | PRN
  Administered 2019-07-20 – 2019-07-23 (×3): 500 mg via ORAL
  Filled 2019-07-20 (×3): qty 1

## 2019-07-20 MED ORDER — FLEET ENEMA 7-19 GM/118ML RE ENEM: 1.0000 | ENEMA | Freq: Once | RECTAL | Status: DC | PRN

## 2019-07-20 MED ORDER — OXYCODONE HCL 5 MG PO TABS: 5.0000 mg | ORAL_TABLET | ORAL | Status: DC | PRN

## 2019-07-20 MED ORDER — PROCHLORPERAZINE EDISYLATE 10 MG/2ML IJ SOLN: 5.0000 mg | Freq: Four times a day (QID) | INTRAMUSCULAR | Status: DC | PRN

## 2019-07-20 MED ORDER — ENOXAPARIN SODIUM 40 MG/0.4ML ~~LOC~~ SOLN: 40.0000 mg | SUBCUTANEOUS | Status: DC

## 2019-07-20 MED ORDER — CARVEDILOL 6.25 MG PO TABS
6.2500 mg | ORAL_TABLET | Freq: Two times a day (BID) | ORAL | Status: DC
  Administered 2019-07-20 – 2019-07-25 (×10): 6.25 mg via ORAL
  Filled 2019-07-20 (×10): qty 1

## 2019-07-20 MED ORDER — GUAIFENESIN-DM 100-10 MG/5ML PO SYRP: 5.0000 mL | ORAL_SOLUTION | Freq: Four times a day (QID) | ORAL | Status: DC | PRN

## 2019-07-20 NOTE — Discharge Summary (Signed)
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 female who was admitted 07/15/2019 with a chief complaint of chronic venous insufficiency ulcers both legs, with a final diagnosis of Bilateral Lower Extremity Venous Ulcers.  Patient was brought to the operating room on 07/17/2019 and underwent Procedure(s): REPEAT IRRIGATION AND DEBRIDEMENT BILATERAL LOWER EXTREMITIES, SKIN GRAFT.    Patient was given perioperative antibiotics:  Anti-infectives (From admission, onward)   Start     Dose/Rate Route Frequency Ordered Stop   07/17/19 1500  ceFAZolin (ANCEF) IVPB 2g/100 mL premix     2 g 200 mL/hr over 30 Minutes Intravenous Every 8 hours 07/17/19 1136 07/20/19 1359   07/17/19 1145  ceFAZolin (ANCEF) 3 g in dextrose 5 % 50 mL IVPB  Status:  Discontinued     3 g 100 mL/hr over 30 Minutes Intravenous On call to O.R. 07/17/19 1136 07/17/19 1155   07/15/19 1400  ceFAZolin (ANCEF) IVPB 2g/100 mL premix  Status:  Discontinued     2 g 200 mL/hr over 30 Minutes Intravenous Every 8 hours 07/15/19 1234 07/17/19 1155   07/15/19 0600  ceFAZolin (ANCEF) 3 g in dextrose 5 % 50 mL IVPB     3 g 100 mL/hr over 30 Minutes Intravenous On call to O.R. 07/14/19 4098 07/15/19 1047    .  Patient was given sequential compression devices, early ambulation, and aspirin for DVT prophylaxis.  Recent vital signs:  Patient Vitals for the past 24 hrs:  BP Temp Temp src Pulse Resp SpO2  07/20/19 1305 113/77 98.4 F (36.9 C) Oral 75 16 98 %  07/20/19 0832 126/78 98.3 F (36.8 C) Oral 70 16 96 %  07/20/19 0407 (!) 94/46 98.1 F (36.7 C) Oral 71 14 97 %  07/20/19 0405 (!) 91/46 98.7 F (37.1 C) Oral 70 14 96 %  07/19/19 2042  (!) 102/45 99.2 F (37.3 C) Oral 74 14 94 %  07/19/19 1431 106/71 98.4 F (36.9 C) Oral 71 16 96 %  .  Recent laboratory studies: No results found.  Discharge Medications:   Allergies as of 07/20/2019      Reactions   Tramadol Other (See Comments)   hallucination      Medication List    TAKE these medications   albuterol 108 (90 Base) MCG/ACT inhaler Commonly known as: VENTOLIN HFA Inhale 2 puffs into the lungs every 6 (six) hours as needed for shortness of breath.   allopurinol 100 MG tablet Commonly known as: ZYLOPRIM Take 2 tablets (200 mg total) by mouth daily. What changed:   how much to take  when to take this   aspirin EC 81 MG tablet Take 81 mg by mouth daily.   carvedilol 6.25 MG tablet Commonly known as: COREG Take 1 tablet (6.25 mg total) by mouth 2 (two) times daily.   furosemide 40 MG tablet Commonly known as: LASIX Take 1.5 tablets (60 mg total) by mouth daily. Take 1-1/2 tablets to = 60 mg What changed: additional instructions       Diagnostic Studies: No results found.  Patient benefited maximally from their hospital stay and there were no complications.     Disposition: Discharge disposition: 02-Transferred to [facility]      Discharge Instructions  Call MD / Call 911   Complete by: As directed    If you experience chest pain or shortness of breath, CALL 911 and be transported to the hospital emergency room.  If you develope a fever above 101 F, pus (white drainage) or increased drainage or redness at the wound, or calf pain, call your surgeon's office.   Constipation Prevention   Complete by: As directed    Drink plenty of fluids.  Prune juice may be helpful.  You may use a stool softener, such as Colace (over the counter) 100 mg twice a day.  Use MiraLax (over the counter) for constipation as needed.   Diet - low sodium heart healthy   Complete by: As directed    Increase activity slowly as tolerated   Complete by: As  directed    Negative Pressure Wound Therapy - Incisional   Complete by: As directed    Change the hospital wound VAC pumps to the Praveena portable wound VAC pump x2.  Patient to be discharged to inpatient rehab with the portable pumps.     Follow-up Information    [name], MD In 1 week.   Specialty: Orthopedic Surgery Contact information: [place] [address] [phone]            Signed: [name] 07/20/2019, 1:43 PM

## 2019-07-20 NOTE — Progress Notes (Signed)
RN called and updated pt's sister, Adessa Primiano, on pt status and plan of care. RN answered all questions to satisfaction. Will continue to monitor.

## 2019-07-20 NOTE — Progress Notes (Signed)
Report called and given to Felizardo Hoffmann, RN in [place]. All questions answered to satisfaction. Praveena wound vacs in place and in working order. Pt transported to room 4MW11 via bed by this RN. Pt's sister, Vaughan Basta, aware that pt transferred to 4MW11.

## 2019-07-20 NOTE — Care Management Note (Signed)
Inpatient Rehabilitation Center Individual Statement of Services  Patient Name:  Kari [name]  Date:  07/20/2019  Welcome to the [facility].  Our goal is to provide you with an individualized program based on your diagnosis and situation, designed to meet your specific needs.  With this comprehensive rehabilitation program, you will be expected to participate in at least 3 hours of rehabilitation therapies Monday-Friday, with modified therapy programming on the weekends.  Your rehabilitation program will include the following services:  Physical Therapy (PT), Occupational Therapy (OT), 24 hour per day rehabilitation nursing, Case Management (Social Worker), Rehabilitation Medicine, Nutrition Services and Pharmacy Services  Weekly team conferences will be held on Wednesday to discuss your progress.  Your Social Worker will talk with you frequently to get your input and to update you on team discussions.  Team conferences with you and your family in attendance may also be held.  Expected length of stay: 7-10 days  Overall anticipated outcome: independent with device  Depending on your progress and recovery, your program may change. Your Social Worker will coordinate services and will keep you informed of any changes. Your Social Worker's name and contact numbers are listed  below.  The following services may also be recommended but are not provided by the [facility]:    [facility] will be made to provide these services after discharge if needed.  Arrangements include referral to agencies that provide these services.  Your insurance has been verified to be:  Medicare & medicaid Your primary doctor is:  [name]  Pertinent information will be shared with your doctor and your insurance company.  Social Worker:  Ovidio Kin, [place] or ([phone]  Information discussed with and copy given to patient by: [name], 07/20/2019, 2:45 PM

## 2019-07-20 NOTE — Progress Notes (Signed)
Patient arrived to unit with staff. Asked what was her pain level and she rated her pain a 7/10. She did not want any pain medicine at this time just to get repositioned in the bed. Once repositioned the patient is resting comfortably in bed talking to family on the phone.

## 2019-07-20 NOTE — Progress Notes (Signed)
Patient ID: Kari [name], female   DOB: 06-Feb-1961, 58 y.o.   MRN: [id number] Patient is slow to mobilize with physical therapy.  Physical therapy has recommended evaluation for inpatient rehab.  Orders are placed for inpatient rehab evaluation.  There has been increased drainage over the weekend and was 150 cc in each wound VAC canister.  We will continue with the wound VACs for about a week.

## 2019-07-20 NOTE — Plan of Care (Signed)

## 2019-07-20 NOTE — Care Management Important Message (Signed)
Important Message  Patient Details  Name: Kari [name] MRN: [id number] Date of Birth: Feb 16, 1961   Medicare Important Message Given:  Yes     Memory Argue 07/20/2019, 3:21 PM

## 2019-07-20 NOTE — Progress Notes (Addendum)
Inpatient Rehab Admissions:  Inpatient Rehab Consult received.  I met with patient at the bedside for rehabilitation assessment and to discuss goals and expectations of an inpatient rehab admission.  She is eager to start therapy and agreeable to CIR stay. She confirmed that she lives with her sister, her daughter and her grandson.  She reports that she will have 24/7 assist at home and her family has been very supportive prior to admission.  I will confirm with her family and page Dr. Sharol Given to see if I am able to admit today.     Addendum: I have approval from Dr. Sharol Given for pt to admit to CIR today. I will let pt/family, RN, and CSW know.   Signed: Shann Medal, PT, DPT Admissions Coordinator [phone] 07/20/19  11:47 AM

## 2019-07-20 NOTE — H&P (Signed)
Physical Medicine and Rehabilitation Admission H&P    CC: Functional deficits due to    HPI: LARAYNE BAXLEY is a 58 year old female with history of HTN, ICM/VF s/p ICD, CKD, moribd obesity, venous statis ulcers s/p I & D with poor healing and was admitted on 07/15/19 for repeat I & D with placement of prevena VAC by Dr. Sharol Given. She was  taken back to OR repeat I and D with STSG and has been maintained on IV antibiotics thorough today. Wound VAC to remain   Review of Systems  Constitutional: Negative for chills and fever.  HENT: Negative for hearing loss and tinnitus.   Eyes: Negative for blurred vision and double vision.  Respiratory: Negative for cough and hemoptysis.   Cardiovascular: Positive for leg swelling. Negative for chest pain and palpitations.  Gastrointestinal: Negative for constipation, heartburn and nausea.  Genitourinary: Negative for dysuria, frequency and urgency.  Musculoskeletal: Negative for back pain and myalgias.  Skin: Negative for rash.  Neurological: Negative for dizziness and headaches.  Psychiatric/Behavioral: Negative for depression. The patient is not nervous/anxious and does not have insomnia.       Past Medical History:  Diagnosis Date  . Arthritis   . Asthma   . CHF (congestive heart failure) ([place]) 03/12/2014   a. EF 40-45% by echo in 06/2017 with normal cors by cath. ICD placed following VT arrest  . Depression   . Headache(784.0)   . History of blood transfusion   . Hyperlipidemia   . Hypertension   . Lung nodules   . Myocardial infarction ([place]) 06/26/2014  . Renal disorder    followed by [facility]  . Sarcoidosis   . Ulcer    recurring, from chronic venous insufficiency    Past Surgical History:  Procedure Laterality Date  . cataract surgery Left 07-2013  . I&D EXTREMITY Bilateral 12/31/2018   Procedure: DEBRIDEMENT BILATERAL LEGS, APPLY VAC X 2;  Surgeon: [name], MD;  Location: [place];  Service: Orthopedics;   Laterality: Bilateral;  . I&D EXTREMITY Bilateral 01/02/2019   Procedure: REPEAT DEBRIDEMENT BILATERAL LEGS, APPLY VAC X 2;  Surgeon: [name], MD;  Location: [place];  Service: Orthopedics;  Laterality: Bilateral;  . I&D EXTREMITY Bilateral 07/15/2019   Procedure: IRRIGATION AND DEBRIDEMENT VENOUS STASIS INSUFFICIENCY ULCERATIONS BILATERAL LOWER EXTREMITIES;  Surgeon: [name], MD;  Location: [place];  Service: Orthopedics;  Laterality: Bilateral;  . ICD IMPLANT N/A 07/05/2017   Procedure: ICD Implant;  Surgeon: Constance Haw, MD;  Location: [facility];  Service: Cardiovascular;  Laterality: N/A;  . LEFT HEART CATH AND CORONARY ANGIOGRAPHY N/A 07/03/2017   Procedure: Left Heart Cath and Coronary Angiography;  Surgeon: Belva Crome, MD;  Location: [place];  Service: Cardiovascular;  Laterality: N/A;  . SKIN SPLIT GRAFT Bilateral 01/07/2019   Procedure: SPLIT THICKNESS SKIN GRAFT BILATERAL LEGS, APPLY VAC;  Surgeon: [name], MD;  Location: [facility];  Service: Orthopedics;  Laterality: Bilateral;    Family History  Problem Relation Age of Onset  . Heart failure Mother   . Diabetes Mellitus II Mother   . Hypertension Mother   . Cancer Mother        unknown type  . Heart disease Father   . Stroke Father   . Diabetes Mellitus II Father     Social History:  Single. Lives with sister who manages the home/cooks. Was independent with cane PTA.  Disabled due  to medical issues. She reports that she has never smoked. She has never used smokeless tobacco. She reports that she does not drink alcohol or use drugs.    Allergies  Allergen Reactions  . Tramadol Other (See Comments)    hallucination    Medications Prior to Admission  Medication Sig Dispense Refill  . allopurinol (ZYLOPRIM) 100 MG tablet Take 2 tablets (200 mg total) by mouth daily. (Patient taking differently: Take 100 mg by mouth 2 (two) times daily. ) 180 tablet 1  . aspirin EC 81 MG tablet Take  81 mg by mouth daily.    . carvedilol (COREG) 6.25 MG tablet Take 1 tablet (6.25 mg total) by mouth 2 (two) times daily. 60 tablet 0  . furosemide (LASIX) 40 MG tablet Take 1.5 tablets (60 mg total) by mouth daily. Take 1-1/2 tablets to = 60 mg (Patient taking differently: Take 60 mg by mouth daily. ) 45 tablet 5  . albuterol (PROVENTIL HFA;VENTOLIN HFA) 108 (90 Base) MCG/ACT inhaler Inhale 2 puffs into the lungs every 6 (six) hours as needed for shortness of breath. 18 g 0    Drug Regimen Review  Drug regimen was reviewed and remains appropriate with no significant issues identified  Home: Home Living Family/patient expects to be discharged to:: Private residence Living Arrangements: Other relatives Available Help at Discharge: Family Type of Home: House Home Access: Level entry Home Layout: One level Bathroom Shower/Tub: Chiropodist: Standard Bathroom Accessibility: Yes Home Equipment: Environmental consultant - 2 wheels, Cane - single point Additional Comments: lives with younger sister and sister is there at all times. Pt has a daughter with an 7 yr grandchild that can provided PRN   Functional History: Prior Function Level of Independence: Needs assistance Gait / Transfers Assistance Needed: ambulates household distances ADL's / Homemaking Assistance Needed: has family help to handle housework and cooking  Functional Status:  Mobility: Bed Mobility Overal bed mobility: Needs Assistance Bed Mobility: Supine to Sit Supine to sit: Supervision General bed mobility comments: HOB elevated and able to advance bil LE off the eob and pulling up with R side of the bed rail. pt with HOB >45 degrees to progress to eob Transfers Overall transfer level: Needs assistance Equipment used: Rolling walker (2 wheeled) Transfers: Sit to/from Stand Sit to Stand: Min assist Stand pivot transfers: Min assist General transfer comment: pt requires verbal cues for safety with RW and to place  bil le against the chair prior to sitting. pt noted to have body habitus taht prevents bil LE inside the RW to sit<>Stand. pt coudl benefit from bariatric RW Ambulation/Gait Ambulation/Gait assistance: Mod assist, +2 safety/equipment Gait Distance (Feet): 12 Feet Assistive device: Rolling walker (2 wheeled) Gait Pattern/deviations: Wide base of support, Decreased step length - right, Decreased step length - left, Trunk flexed General Gait Details: Dependent on UE support; cues to self-monitor for activity tolerance Gait velocity: quite slow    ADL: ADL Overall ADL's : Needs assistance/impaired Eating/Feeding: Independent Eating/Feeding Details (indicate cue type and reason): pt requesting cereal and stating she does not like the delivered meal tray. very direct in meal request for food Grooming: Wash/dry face, Modified independent, Sitting Upper Body Bathing: Minimal assistance Lower Body Bathing: Maximal assistance Upper Body Dressing : Minimal assistance Lower Body Dressing: Maximal assistance Lower Body Dressing Details (indicate cue type and reason): pt is able to reach calf area but not feet at this time. pt with body habitus preventing full reach  Toilet Transfer: Minimal assistance,  Stand-pivot Toileting- Clothing Manipulation and Hygiene: Maximal assistance General ADL Comments: pt with full control of bladder and bowel so recommending that RN staff utilize the [facility] at this time and d/c the pure wick. Tech arriving and also notified of the dc of the pure wick.   Cognition: Cognition Overall Cognitive Status: Within Functional Limits for tasks assessed Orientation Level: Oriented X4 Cognition Arousal/Alertness: Awake/alert Behavior During Therapy: WFL for tasks assessed/performed Overall Cognitive Status: Within Functional Limits for tasks assessed General Comments: pt is motivated to move herself  Physical Exam: Blood pressure 126/78, pulse 70, temperature 98.3 F (36.8  C), temperature source Oral, resp. rate 16, height 5\' 7"  (1.702 m), weight 120.2 kg, last menstrual period 11/21/2011, SpO2 96 %. Physical Exam  Nursing note and vitals reviewed. Constitutional: She appears well-developed and well-nourished. No distress.  Morbidly obese  HENT:  Head: Normocephalic and atraumatic.  Eyes: Pupils are equal, round, and reactive to light. EOM are normal.  Neck: Normal range of motion. No thyromegaly present.  Cardiovascular: Normal rate and regular rhythm. Exam reveals no friction rub.  No murmur heard. Respiratory: Effort normal. No respiratory distress. She has no wheezes.  GI: Soft. She exhibits no distension. There is no abdominal tenderness.  Musculoskeletal:        General: Tenderness (bilateral legs near wound areas) and edema (bilateral LE's) present.     Comments: BLE with coban dressings and prevena VACs in place--small amount of serous drainage noted (just changed)  Neurological: She is alert. No cranial nerve deficit. Coordination normal.  UE motor 4+/5. LE: 3-/5 HF, KE and 3/5 ADF/PF. No focal sensory findings.   Skin: She is not diaphoretic.  Bilateral LE's dressed.   Psychiatric: She has a normal mood and affect. Her behavior is normal. Judgment and thought content normal.    No results found for this or any previous visit (from the past 48 hour(s)). No results found.     Medical Problem List and Plan: 1.   Functional deficits secondary to BLE u;cers s/p skin grafts with debility  -admit to inpatient rehab 2.  Antithrombotics: -DVT/anticoagulation:  Pharmaceutical: Lovenox  -antiplatelet therapy: low dose ASA 3. Pain Management: tylenol or oxycodone prn.  4. Mood: LCSW to follow for evaluation and support.   -antipsychotic agents: N/A 5. Neuropsych: This patient is capable of making decisions on her  own behalf. 6. Skin/Wound Care: Continue wound VAC to bilateral LE's per surgery recs.  7. Fluids/Electrolytes/Nutrition: Monitor I/O.  Add supplements to promote healing. Check lytes in am.  8. CKD: Avoid  Nephrotoxic medications. SCr 1.3 at baseline? Will check post op labs in am.  9. NISCM/s/p ICD: On lasix and coreg. Monitor for hypotension.  10. Anemia of chronic disease: Will check post op CBC in am.  11. Leucocytosis: Monitor for signs of fever or infection.  12. Morbid obesity: Encourage weight loss to help promote overall health and mobility. Will consult dietitian to educate patient on appropriate diet.       Bary Leriche, PA-C 07/20/2019

## 2019-07-20 NOTE — Evaluation (Signed)
Occupational Therapy Evaluation Patient Details Name: Kari [name] MRN: [id number] DOB: [birth year] Today's Date: 07/20/2019    History of Present Illness 58 yo female with onset of BLE debridement for venous stasis ulcers was referred to PT.  Had skin grafting in Jan 2020, then compression wraps.  Now has wound vacs in place, WBAT.  PMHx;  chronic venous stasis ulcers LE's, skin grafting LE's, HA, transfusion, renal disorder, sarcoidosis, lung nodules, CHF, EF 40-45%, HTN, MI, depression, asthma, OA   Clinical Impression   PT admitted with BiL LE debridement. Pt currently with functional limitiations due to the deficits listed below (see OT problem list). Pt currently requires elevated surface for basic transfers with decr activity tolerance and needing education for safety with DME. Pt could progress to MOD I with CIR admission.  Pt will benefit from skilled OT to increase their independence and safety with adls and balance to allow discharge CIR.     Follow Up Recommendations  CIR    Equipment Recommendations  None recommended by OT    Recommendations for Other Services Rehab consult     Precautions / Restrictions Precautions Precautions: Fall Precaution Comments: B wound vacs Restrictions Weight Bearing Restrictions: Yes RLE Weight Bearing: Weight bearing as tolerated LLE Weight Bearing: Weight bearing as tolerated      Mobility Bed Mobility Overal bed mobility: Needs Assistance Bed Mobility: Supine to Sit     Supine to sit: Supervision     General bed mobility comments: HOB elevated and able to advance bil LE off the eob and pulling up with R side of the bed rail. pt with HOB >45 degrees to progress to eob  Transfers Overall transfer level: Needs assistance Equipment used: Rolling walker (2 wheeled) Transfers: Sit to/from Stand Sit to Stand: Min assist Stand pivot transfers: Min assist       General transfer comment: pt requires verbal cues for safety  with RW and to place bil le against the chair prior to sitting. pt noted to have body habitus taht prevents bil LE inside the RW to sit<>Stand. pt coudl benefit from bariatric RW    Balance Overall balance assessment: Needs assistance Sitting-balance support: Feet supported Sitting balance-Leahy Scale: Good     Standing balance support: Bilateral upper extremity supported Standing balance-Leahy Scale: Fair Standing balance comment: reliant on RW                           ADL either performed or assessed with clinical judgement   ADL Overall ADL's : Needs assistance/impaired Eating/Feeding: Independent Eating/Feeding Details (indicate cue type and reason): pt requesting cereal and stating she does not like the delivered meal tray. very direct in meal request for food Grooming: Wash/dry face;Modified independent;Sitting   Upper Body Bathing: Minimal assistance   Lower Body Bathing: Maximal assistance   Upper Body Dressing : Minimal assistance   Lower Body Dressing: Maximal assistance Lower Body Dressing Details (indicate cue type and reason): pt is able to reach calf area but not feet at this time. pt with body habitus preventing full reach  Toilet Transfer: Minimal assistance;Stand-pivot   Toileting- Clothing Manipulation and Hygiene: Maximal assistance         General ADL Comments: pt with full control of bladder and bowel so recommending that RN staff utilize the [facility] at this time and d/c the pure wick. Tech arriving and also notified of the dc of the pure wick.      Vision  Baseline Vision/History: No visual deficits       Perception     Praxis      Pertinent Vitals/Pain Pain Assessment: 0-10 Pain Score: 8  Pain Location: R LE Pain Descriptors / Indicators: Operative site guarding Pain Intervention(s): Monitored during session;Repositioned;Patient requesting pain meds-RN notified     Hand Dominance Right   Extremity/Trunk Assessment Upper Extremity  Assessment Upper Extremity Assessment: Overall WFL for tasks assessed   Lower Extremity Assessment Lower Extremity Assessment: Defer to PT evaluation   Cervical / Trunk Assessment Cervical / Trunk Assessment: Normal   Communication Communication Communication: No difficulties   Cognition Arousal/Alertness: Awake/alert Behavior During Therapy: WFL for tasks assessed/performed Overall Cognitive Status: Within Functional Limits for tasks assessed                                     General Comments  bil wound vac on bil Le. pt with chair elevated with pillows to help with transfer.     Exercises     Shoulder Instructions      Home Living Family/patient expects to be discharged to:: Private residence Living Arrangements: Other relatives Available Help at Discharge: Family Type of Home: House Home Access: Level entry     Home Layout: One level     Bathroom Shower/Tub: Teacher, early years/pre: Standard Bathroom Accessibility: Yes   Home Equipment: Environmental consultant - 2 wheels;Kasandra Knudsen - single point   Additional Comments: lives with younger sister and sister is there at all times. Pt has a daughter with an 7 yr grandchild that can provided PRN      Prior Functioning/Environment Level of Independence: Needs assistance  Gait / Transfers Assistance Needed: ambulates household distances ADL's / Homemaking Assistance Needed: has family help to handle housework and cooking            OT Problem List: Decreased activity tolerance;Decreased knowledge of precautions;Decreased knowledge of use of DME or AE;Decreased safety awareness;Pain;Obesity;Impaired balance (sitting and/or standing)      OT Treatment/Interventions: Self-care/ADL training;Therapeutic exercise;Therapeutic activities;Energy conservation;DME and/or AE instruction;Manual therapy;Balance training;Patient/family education    OT Goals(Current goals can be found in the care plan section) Acute Rehab  OT Goals Patient Stated Goal: to be able to return home. agreeable to CIR as long as it is here in the hospital OT Goal Formulation: With patient Time For Goal Achievement: 08/03/19 Potential to Achieve Goals: Good  OT Frequency: Min 2X/week   Barriers to D/C:            Co-evaluation              AM-PAC OT "6 Clicks" Daily Activity     Outcome Measure Help from another person eating meals?: None Help from another person taking care of personal grooming?: A Little Help from another person toileting, which includes using toliet, bedpan, or urinal?: A Lot Help from another person bathing (including washing, rinsing, drying)?: A Lot Help from another person to put on and taking off regular upper body clothing?: A Little Help from another person to put on and taking off regular lower body clothing?: Total 6 Click Score: 15   End of Session Equipment Utilized During Treatment: Gait belt;Rolling walker Nurse Communication: Mobility status;Precautions  Activity Tolerance: Patient tolerated treatment well Patient left: in chair;with chair alarm set;with nursing/sitter in room  OT Visit Diagnosis: Unsteadiness on feet (R26.81)  Time: 8719-5974 OT Time Calculation (min): 30 min Charges:  OT General Charges $OT Visit: 1 Visit OT Evaluation $OT Eval Moderate Complexity: 1 Mod OT Treatments $Self Care/Home Management : 8-22 mins   Jeri Modena, OTR/L  Acute Rehabilitation Services Pager: [phone] Office: [phone] .   Jeri Modena 07/20/2019, 8:27 AM

## 2019-07-20 NOTE — H&P (Signed)
Physical Medicine and Rehabilitation Admission H&P     CC: Functional deficits due to      HPI: Kari [name] is a 58 year old female with history of HTN, ICM/VF s/p ICD, CKD, moribd obesity, venous statis ulcers s/p I & D with poor healing and was admitted on 07/15/19 for repeat I & D with placement of prevena VAC by Dr. Sharol Given. Kari [name] was  taken back to OR repeat I and D with STSG and has been maintained on IV antibiotics thorough today. Wound VAC to remain     Review of Systems  Constitutional: Negative for chills and fever.  HENT: Negative for hearing loss and tinnitus.   Eyes: Negative for blurred vision and double vision.  Respiratory: Negative for cough and hemoptysis.   Cardiovascular: Positive for leg swelling. Negative for chest pain and palpitations.  Gastrointestinal: Negative for constipation, heartburn and nausea.  Genitourinary: Negative for dysuria, frequency and urgency.  Musculoskeletal: Negative for back pain and myalgias.  Skin: Negative for rash.  Neurological: Negative for dizziness and headaches.  Psychiatric/Behavioral: Negative for depression. The patient is not nervous/anxious and does not have insomnia.             Past Medical History:  Diagnosis Date  . Arthritis    . Asthma    . CHF (congestive heart failure) ([place]) 03/12/2014    a. EF 40-45% by echo in 06/2017 with normal cors by cath. ICD placed following VT arrest  . Depression    . Headache(784.0)    . History of blood transfusion    . Hyperlipidemia    . Hypertension    . Lung nodules    . Myocardial infarction ([place]) 06/26/2014  . Renal disorder      followed by [facility]  . Sarcoidosis    . Ulcer      recurring, from chronic venous insufficiency          Past Surgical History:  Procedure Laterality Date  . cataract surgery Left 07-2013  . I&D EXTREMITY Bilateral 12/31/2018    Procedure: DEBRIDEMENT BILATERAL LEGS, APPLY VAC X 2;  Surgeon: [name], MD;  Location: [place];  Service: Orthopedics;  Laterality: Bilateral;  . I&D EXTREMITY Bilateral 01/02/2019    Procedure: REPEAT DEBRIDEMENT BILATERAL LEGS, APPLY VAC X 2;  Surgeon: [name], MD;  Location: [place];  Service: Orthopedics;  Laterality: Bilateral;  . I&D EXTREMITY Bilateral 07/15/2019    Procedure: IRRIGATION AND DEBRIDEMENT VENOUS STASIS INSUFFICIENCY ULCERATIONS BILATERAL LOWER EXTREMITIES;  Surgeon: [name], MD;  Location: [place];  Service: Orthopedics;  Laterality: Bilateral;  . ICD IMPLANT N/A 07/05/2017    Procedure: ICD Implant;  Surgeon: Constance Haw, MD;  Location: [place] [facility];  Service: Cardiovascular;  Laterality: N/A;  . LEFT HEART CATH AND CORONARY ANGIOGRAPHY N/A 07/03/2017    Procedure: Left Heart Cath and Coronary Angiography;  Surgeon: Belva Crome, MD;  Location: [place] [facility];  Service: Cardiovascular;  Laterality: N/A;  . SKIN SPLIT GRAFT Bilateral 01/07/2019    Procedure: SPLIT THICKNESS SKIN GRAFT BILATERAL LEGS, APPLY VAC;  Surgeon: [name], MD;  Location: [place];  Service: Orthopedics;  Laterality: Bilateral;          Family History  Problem Relation Age of Onset  . Heart failure Mother    . Diabetes Mellitus II Mother    . Hypertension Mother    . Cancer Mother  unknown type  . Heart disease Father    . Stroke Father    . Diabetes Mellitus II Father       Social History:  Single. Lives with sister who manages the home/cooks. Was independent with cane PTA.  Disabled due to medical issues. Kari [name] reports that Kari [name] has never smoked. Kari [name] has never used smokeless tobacco. Kari [name] reports that Kari [name] does not drink alcohol or use drugs.           Allergies  Allergen Reactions  . Tramadol Other (See Comments)      hallucination           Medications Prior to Admission  Medication Sig Dispense Refill  . allopurinol (ZYLOPRIM) 100 MG tablet Take 2 tablets (200 mg total) by mouth daily. (Patient taking differently: Take 100 mg by mouth 2  (two) times daily. ) 180 tablet 1  . aspirin EC 81 MG tablet Take 81 mg by mouth daily.      . carvedilol (COREG) 6.25 MG tablet Take 1 tablet (6.25 mg total) by mouth 2 (two) times daily. 60 tablet 0  . furosemide (LASIX) 40 MG tablet Take 1.5 tablets (60 mg total) by mouth daily. Take 1-1/2 tablets to = 60 mg (Patient taking differently: Take 60 mg by mouth daily. ) 45 tablet 5  . albuterol (PROVENTIL HFA;VENTOLIN HFA) 108 (90 Base) MCG/ACT inhaler Inhale 2 puffs into the lungs every 6 (six) hours as needed for shortness of breath. 18 g 0     Drug Regimen Review  Drug regimen was reviewed and remains appropriate with no significant issues identified   Home: Home Living Family/patient expects to be discharged to:: Private residence Living Arrangements: Other relatives Available Help at Discharge: Family Type of Home: House Home Access: Level entry Home Layout: One level Bathroom Shower/Tub: Chiropodist: Standard Bathroom Accessibility: Yes Home Equipment: Environmental consultant - 2 wheels, Cane - single point Additional Comments: lives with younger sister and sister is there at all times. Pt has a daughter with an 7 yr grandchild that can provided PRN   Functional History: Prior Function Level of Independence: Needs assistance Gait / Transfers Assistance Needed: ambulates household distances ADL's / Homemaking Assistance Needed: has family help to handle housework and cooking   Functional Status:  Mobility: Bed Mobility Overal bed mobility: Needs Assistance Bed Mobility: Supine to Sit Supine to sit: Supervision General bed mobility comments: HOB elevated and able to advance bil LE off the eob and pulling up with R side of the bed rail. pt with HOB >45 degrees to progress to eob Transfers Overall transfer level: Needs assistance Equipment used: Rolling walker (2 wheeled) Transfers: Sit to/from Stand Sit to Stand: Min assist Stand pivot transfers: Min assist General  transfer comment: pt requires verbal cues for safety with RW and to place bil le against the chair prior to sitting. pt noted to have body habitus taht prevents bil LE inside the RW to sit<>Stand. pt coudl benefit from bariatric RW Ambulation/Gait Ambulation/Gait assistance: Mod assist, +2 safety/equipment Gait Distance (Feet): 12 Feet Assistive device: Rolling walker (2 wheeled) Gait Pattern/deviations: Wide base of support, Decreased step length - right, Decreased step length - left, Trunk flexed General Gait Details: Dependent on UE support; cues to self-monitor for activity tolerance Gait velocity: quite slow     ADL: ADL Overall ADL's : Needs assistance/impaired Eating/Feeding: Independent Eating/Feeding Details (indicate cue type and reason): pt requesting cereal and stating Kari [name] does not like the delivered meal tray.  very direct in meal request for food Grooming: Wash/dry face, Modified independent, Sitting Upper Body Bathing: Minimal assistance Lower Body Bathing: Maximal assistance Upper Body Dressing : Minimal assistance Lower Body Dressing: Maximal assistance Lower Body Dressing Details (indicate cue type and reason): pt is able to reach calf area but not feet at this time. pt with body habitus preventing full reach  Toilet Transfer: Minimal assistance, Stand-pivot Toileting- Clothing Manipulation and Hygiene: Maximal assistance General ADL Comments: pt with full control of bladder and bowel so recommending that RN staff utilize the [facility] at this time and d/c the pure wick. Tech arriving and also notified of the dc of the pure wick.    Cognition: Cognition Overall Cognitive Status: Within Functional Limits for tasks assessed Orientation Level: Oriented X4 Cognition Arousal/Alertness: Awake/alert Behavior During Therapy: WFL for tasks assessed/performed Overall Cognitive Status: Within Functional Limits for tasks assessed General Comments: pt is motivated to move herself    Physical Exam: Blood pressure 126/78, pulse 70, temperature 98.3 F (36.8 C), temperature source Oral, resp. rate 16, height 5\' 7"  (1.702 m), weight 120.2 kg, last menstrual period 11/21/2011, SpO2 96 %. Physical Exam  Nursing note and vitals reviewed. Constitutional: Kari [name] appears well-developed and well-nourished. No distress.  Morbidly obese  HENT:  Head: Normocephalic and atraumatic.  Eyes: Pupils are equal, round, and reactive to light. EOM are normal.  Neck: Normal range of motion. No thyromegaly present.  Cardiovascular: Normal rate and regular rhythm. Exam reveals no friction rub.  No murmur heard. Respiratory: Effort normal. No respiratory distress. Kari [name] has no wheezes.  GI: Soft. Kari [name] exhibits no distension. There is no abdominal tenderness.  Musculoskeletal:        General: Tenderness (bilateral legs near wound areas) and edema (bilateral LE's) present.     Comments: BLE with coban dressings and prevena VACs in place--small amount of serous drainage noted (just changed)  Neurological: Kari [name] is alert. No cranial nerve deficit. Coordination normal.  UE motor 4+/5. LE: 3-/5 HF, KE and 3/5 ADF/PF. No focal sensory findings.   Skin: Kari [name] is not diaphoretic.  Bilateral LE's dressed.   Psychiatric: Kari [name] has a normal mood and affect. Kari [name] behavior is normal. Judgment and thought content normal.      Lab Results Last 48 Hours  No results found for this or any previous visit (from the past 48 hour(s)).   Imaging Results (Last 48 hours)  No results found.           Medical Problem List and Plan: 1.   Functional deficits secondary to BLE u;cers s/p skin grafts with debility             -admit to inpatient rehab 2.  Antithrombotics: -DVT/anticoagulation:  Pharmaceutical: Lovenox             -antiplatelet therapy: low dose ASA 3. Pain Management: tylenol or oxycodone prn.  4. Mood: LCSW to follow for evaluation and support.              -antipsychotic agents: N/A 5. Neuropsych: This  patient is capable of making decisions on Kari [name]  own behalf. 6. Skin/Wound Care: Continue wound VAC to bilateral LE's per surgery recs.  7. Fluids/Electrolytes/Nutrition: Monitor I/O. Add supplements to promote healing. Check lytes in am.  8. CKD: Avoid  Nephrotoxic medications. SCr 1.3 at baseline? Will check post op labs in am.  9. NISCM/s/p ICD: On lasix and coreg. Monitor for hypotension.  10. Anemia of chronic disease: Will check post op CBC in  am.  11. Leucocytosis: Monitor for signs of fever or infection.  12. Morbid obesity: Encourage weight loss to help promote overall health and mobility. Will consult dietitian to educate patient on appropriate diet.       Post Admission Physician Evaluation: 1. Functional deficits secondary  to debility bilateral LE wounds. 2. Patient is admitted to receive collaborative, interdisciplinary care between the physiatrist, rehab nursing staff, and therapy team. 3. Patient's level of medical complexity and substantial therapy needs in context of that medical necessity cannot be provided at a lesser intensity of care such as a SNF. 4. Patient has experienced substantial functional loss from his/Kari [name] baseline which was documented above under the "Functional History" and "Functional Status" headings.  Judging by the patient's diagnosis, physical exam, and functional history, the patient has potential for functional progress which will result in measurable gains while on inpatient rehab.  These gains will be of substantial and practical use upon discharge  in facilitating mobility and self-care at the household level. 5. Physiatrist will provide 24 hour management of medical needs as well as oversight of the therapy plan/treatment and provide guidance as appropriate regarding the interaction of the two. 6. The Preadmission Screening has been reviewed and patient status is unchanged unless otherwise stated above. 7. 24 hour rehab nursing will assist with bladder  management, bowel management, safety, skin/wound care, disease management, medication administration, pain management and patient education  and help integrate therapy concepts, techniques,education, etc. 8. PT will assess and treat for/with:Lower extremity strength, range of motion, stamina, balance, functional mobility, safety, adaptive techniques and equipment, pain mgt.   Goals are: mod I. 9. OT will assess and treat for/with: ADL's, functional mobility, safety, upper extremity strength, adaptive techniques and equipment, pain mgt, wound care.   Goals are: mod I. Therapy may potentially proceed with showering this patient. 10. SLP will assess and treat for/with: n/a.  Goals are: n/a. 11. Case Management and Social Worker will assess and treat for psychological issues and discharge planning. 12. Team conference will be held weekly to assess progress toward goals and to determine barriers to discharge. 13. Patient will receive at least 3 hours of therapy per day at least 5 days per week. 14. ELOS: 10-14 days       15. Prognosis:  excellent   I have personally performed a face to face diagnostic evaluation of this patient and formulated the key components of the plan.  Additionally, I have personally reviewed laboratory data, imaging studies, as well as relevant notes and concur with the physician assistant's documentation above.  Meredith Staggers, MD, [facility]    Bary Leriche, PA-C 07/20/2019

## 2019-07-20 NOTE — PMR Pre-admission (Signed)
PMR Admission Coordinator Pre-Admission Assessment  Patient: Kari [name] is an 58 y.o., female MRN: [id number] DOB: January 10, 1961 Height: '5\' 7"'  (170.2 cm) Weight: 120.2 kg  Insurance Information HMO:     PPO:      PCP:      IPA:      80/20:      OTHER:  PRIMARY: Medicare Part A and B       Policy#: [id number]      Subscriber: patient CM Name:       Phone#:      Fax#:  Pre-Cert#: verified online      Employer:  Benefits:  Phone #:      Name:  Eff. Date: A and B effective 10/25/15     Deduct: $1408      Out of Pocket Max: n/a      Life Max: n/a CIR: 100%      SNF: 20 full days Outpatient: 80%     Co-Pay: 20% Home Health: 100%      Co-Pay:  DME: 80%     Co-Pay: 20% Providers: pt choice  SECONDARY: Medicaid      Policy#: [id number]    Medicaid Application Date:       Case Manager:  Disability Application Date:       Case Worker:   The "Data Collection Information Summary" for patients in Inpatient Rehabilitation Facilities with attached "Privacy Act [place] Records" was provided and verbally reviewed with: Patient  Emergency Contact Information Contact Information    Name Relation Home Work Mobile   Darthula, Desa Sister [phone]        Current Medical History  Patient Admitting Diagnosis: bilateral lower extremity skin grafts and I&D   History of Present Illness: Kari [name] is a 58 y/o female admitted to [facility] for bilateral chronic venous stasis ulcers in bilateral LEs.  She had a skin graft in January 2020 and undergone serial compression wraps with persistent ulceration.  She presented on 7/22 for repeat debridement and application of skin grafts and wound vacs.  PMH significant for HTN, MI, renal disorder, sarcoidosis, lung nodules, CHF (EF 40-45%).  Pt with plan for wound vac x1 week, and IV antibiotics.  Therapy evaluations were completed and recommendations for CIR were made.  Hospital course pain management and IV abx (complete on 07/20/2019).       Patient's medical record from [facility] has been reviewed by the rehabilitation admission coordinator and physician.  Past Medical History  Past Medical History:  Diagnosis Date  . Arthritis   . Asthma   . CHF (congestive heart failure) ([place]) 03/12/2014   a. EF 40-45% by echo in 06/2017 with normal cors by cath. ICD placed following VT arrest  . Depression   . Headache(784.0)   . History of blood transfusion   . Hyperlipidemia   . Hypertension   . Lung nodules   . Myocardial infarction ([place]) 06/26/2014  . Renal disorder    followed by [facility]  . Sarcoidosis   . Ulcer    recurring, from chronic venous insufficiency    Family History   family history includes Cancer in her mother; Diabetes Mellitus II in her father and mother; Heart disease in her father; Heart failure in her mother; Hypertension in her mother; Stroke in her father.  Prior Rehab/Hospitalizations Has the patient had prior rehab or hospitalizations prior to admission? Yes  Has the patient had major surgery during 100 days prior to admission? Yes  Current Medications  Current Facility-Administered Medications:  .  0.9 %  sodium chloride infusion, , Intravenous, Continuous, [name], MD, Last Rate: 10 mL/hr at 07/15/19 1336 .  0.9 %  sodium chloride infusion, , Intravenous, Continuous, [name], MD, Last Rate: 10 mL/hr at 07/19/19 2313 .  acetaminophen (TYLENOL) tablet 325-650 mg, 325-650 mg, Oral, Q6H PRN, [name], MD .  albuterol (PROVENTIL) (2.5 MG/3ML) 0.083% nebulizer solution 2.5 mg, 2.5 mg, Inhalation, Q6H PRN, [name], MD .  allopurinol (ZYLOPRIM) tablet 100 mg, 100 mg, Oral, BID, [name], MD, 100 mg at 07/20/19 0907 .  aspirin EC tablet 81 mg, 81 mg, Oral, Daily, [name], MD, 81 mg at 07/20/19 0907 .  bisacodyl (DULCOLAX) suppository 10 mg, 10 mg, Rectal, Daily PRN, [name], MD .  carvedilol (COREG) tablet 6.25 mg, 6.25 mg, Oral, BID,  [name], MD, 6.25 mg at 07/20/19 0907 .  ceFAZolin (ANCEF) IVPB 2g/100 mL premix, 2 g, Intravenous, Q8H, [name], MD, Last Rate: 200 mL/hr at 07/20/19 0632, 2 g at 07/20/19 2876 .  docusate sodium (COLACE) capsule 100 mg, 100 mg, Oral, BID, [name], MD, 100 mg at 07/20/19 0907 .  furosemide (LASIX) tablet 60 mg, 60 mg, Oral, Daily, [name], MD, 60 mg at 07/20/19 0907 .  HYDROmorphone (DILAUDID) injection 0.5-1 mg, 0.5-1 mg, Intravenous, Q4H PRN, [name], MD, 1 mg at 07/19/19 0538 .  lactated ringers infusion, , Intravenous, Continuous, [name], MD, Last Rate: 50 mL/hr at 07/17/19 0908 .  magnesium citrate solution 1 Bottle, 1 Bottle, Oral, Once PRN, [name], MD .  methocarbamol (ROBAXIN) tablet 500 mg, 500 mg, Oral, Q6H PRN, 500 mg at 07/19/19 2317 **OR** methocarbamol (ROBAXIN) 500 mg in dextrose 5 % 50 mL IVPB, 500 mg, Intravenous, Q6H PRN, [name], MD .  metoCLOPramide (REGLAN) tablet 5-10 mg, 5-10 mg, Oral, Q8H PRN **OR** metoCLOPramide (REGLAN) injection 5-10 mg, 5-10 mg, Intravenous, Q8H PRN, [name], MD .  ondansetron (ZOFRAN) tablet 4 mg, 4 mg, Oral, Q6H PRN **OR** ondansetron (ZOFRAN) injection 4 mg, 4 mg, Intravenous, Q6H PRN, [name], MD .  oxyCODONE (Oxy IR/ROXICODONE) immediate release tablet 10-15 mg, 10-15 mg, Oral, Q4H PRN, [name], MD, 10 mg at 07/20/19 0908 .  oxyCODONE (Oxy IR/ROXICODONE) immediate release tablet 5-10 mg, 5-10 mg, Oral, Q4H PRN, [name], MD, 10 mg at 07/19/19 2318 .  polyethylene glycol (MIRALAX / GLYCOLAX) packet 17 g, 17 g, Oral, Daily PRN, [name], MD, 17 g at 07/19/19 1950  Patients Current Diet:  Diet Order            Diet Carb Modified Fluid consistency: Thin; Room service appropriate? Yes  Diet effective now              Precautions / Restrictions Precautions Precautions: Fall Precaution Comments: B wound vacs Restrictions Weight Bearing Restrictions:  Yes RLE Weight Bearing: Weight bearing as tolerated LLE Weight Bearing: Weight bearing as tolerated   Has the patient had 2 or more falls or a fall with injury in the past year? No  Prior Activity Level Limited Community (1-2x/wk): used transport for dr's appointments, didn't drive   Prior Functional Level Self Care: Did the patient need help bathing, dressing, using the toilet or eating? Independent  Indoor Mobility: Did the patient need assistance with walking from room to room (with or without  device)? Independent  Stairs: Did the patient need assistance with internal or external stairs (with or without device)? Independent  Functional Cognition: Did the patient need help planning regular tasks such as shopping or remembering to take medications? Independent  Home Assistive Devices / Equipment Home Assistive Devices/Equipment: Eyeglasses, Radio producer (specify quad or straight), Blood pressure cuff Home Equipment: Walker - 2 wheels, Cane - single point  Prior Device Use: Indicate devices/aids used by the patient prior to current illness, exacerbation or injury? occasionally used SPC, rollator, or RW  Current Functional Level Cognition  Overall Cognitive Status: Within Functional Limits for tasks assessed Orientation Level: Oriented X4 General Comments: pt is motivated to move herself    Extremity Assessment (includes Sensation/Coordination)  Upper Extremity Assessment: Overall WFL for tasks assessed  Lower Extremity Assessment: Defer to PT evaluation    ADLs  Overall ADL's : Needs assistance/impaired Eating/Feeding: Independent Eating/Feeding Details (indicate cue type and reason): pt requesting cereal and stating she does not like the delivered meal tray. very direct in meal request for food Grooming: Wash/dry face, Modified independent, Sitting Upper Body Bathing: Minimal assistance Lower Body Bathing: Maximal assistance Upper Body Dressing : Minimal assistance Lower Body  Dressing: Maximal assistance Lower Body Dressing Details (indicate cue type and reason): pt is able to reach calf area but not feet at this time. pt with body habitus preventing full reach  Toilet Transfer: Minimal assistance, Stand-pivot Toileting- Clothing Manipulation and Hygiene: Maximal assistance General ADL Comments: pt with full control of bladder and bowel so recommending that RN staff utilize the [facility] at this time and d/c the pure wick. Tech arriving and also notified of the dc of the pure wick.     Mobility  Overal bed mobility: Needs Assistance Bed Mobility: Supine to Sit Supine to sit: Supervision General bed mobility comments: HOB elevated and able to advance bil LE off the eob and pulling up with R side of the bed rail. pt with HOB >45 degrees to progress to eob    Transfers  Overall transfer level: Needs assistance Equipment used: Rolling walker (2 wheeled) Transfers: Sit to/from Stand Sit to Stand: Min assist Stand pivot transfers: Min assist General transfer comment: pt requires verbal cues for safety with RW and to place bil le against the chair prior to sitting. pt noted to have body habitus taht prevents bil LE inside the RW to sit<>Stand. pt coudl benefit from bariatric RW    Ambulation / Gait / Stairs / Wheelchair Mobility  Ambulation/Gait Ambulation/Gait assistance: Mod assist, +2 safety/equipment Gait Distance (Feet): 12 Feet Assistive device: Rolling walker (2 wheeled) Gait Pattern/deviations: Wide base of support, Decreased step length - right, Decreased step length - left, Trunk flexed General Gait Details: Dependent on UE support; cues to self-monitor for activity tolerance Gait velocity: quite slow    Posture / Balance Balance Overall balance assessment: Needs assistance Sitting-balance support: Feet supported Sitting balance-Leahy Scale: Good Standing balance support: Bilateral upper extremity supported Standing balance-Leahy Scale: Fair Standing  balance comment: reliant on RW    Special needs/care consideration BiPAP/CPAP no CPM no Continuous Drip IV no Dialysis no        Days n/a Life Vest no Oxygen no Special Bed no Trach Size no Wound Vac (area) yes      Location bilat LE Skin incisions to BLE               Bowel mgmt: continent, last BM 07/15/2019 Bladder mgmt: continent Diabetic mgmt: no Behavioral consideration no  Chemo/radiation no   Previous Home Environment (from acute therapy documentation) Living Arrangements: Other relatives Available Help at Discharge: Family Type of Home: House Home Layout: One level Home Access: Level entry Bathroom Shower/Tub: Chiropodist: Standard Bathroom Accessibility: Yes Home Care Services: No Additional Comments: lives with younger sister and sister is there at all times. Pt has a daughter with an 7 yr grandchild that can provided PRN  Discharge Living Setting Plans for Discharge Living Setting: Patient's home, Lives with (comment) Type of Home at Discharge: Other (Comment)(boarding house) Discharge Home Layout: One level Discharge Home Access: Stairs to enter Entrance Stairs-Rails: Left Entrance Stairs-Number of Steps: 8 Discharge Bathroom Shower/Tub: Tub/shower unit Discharge Bathroom Toilet: Standard Discharge Bathroom Accessibility: Yes How Accessible: Accessible via walker Does the patient have any problems obtaining your medications?: No  Social/Family/Support Systems Patient Roles: Parent Anticipated Caregiver: sister, Vaughan Basta Anticipated Ambulance person Information: [phone] Ability/Limitations of Caregiver: min assist Caregiver Availability: 24/7 Discharge Plan Discussed with Primary Caregiver: Yes Is Caregiver In Agreement with Plan?: Yes Does Caregiver/Family have Issues with Lodging/Transportation while Pt is in Rehab?: No  Goals/Additional Needs Patient/Family Goal for Rehab: PT/OT mod I Expected length of stay: 10-14  days Dietary Needs: carb modified/thin Equipment Needs: tbd Pt/Family Agrees to Admission and willing to participate: Yes Program Orientation Provided & Reviewed with Pt/Caregiver Including Roles  & Responsibilities: Yes  Decrease burden of Care through IP rehab admission: n/a  Possible need for SNF placement upon discharge: no  Patient Condition: I have reviewed medical records from [facility], spoken with CSW, and patient and family member. I met with patient at the bedside and discussed with sister, Vaughan Basta, via phone for inpatient rehabilitation assessment.  Patient will benefit from ongoing PT and OT, can actively participate in 3 hours of therapy a day 5 days of the week, and can make measurable gains during the admission.  Patient will also benefit from the coordinated team approach during an Inpatient Acute Rehabilitation admission.  The patient will receive intensive therapy as well as Rehabilitation physician, nursing, social worker, and care management interventions.  Due to safety, skin/wound care, disease management, medication administration, pain management and patient education the patient requires 24 hour a day rehabilitation nursing.  The patient is currently min with mobility and max with basic ADLs.  Discharge setting and therapy post discharge at home with home health is anticipated.  Patient has agreed to participate in the Acute Inpatient Rehabilitation Program and will admit today.  Preadmission Screen Completed By:  Michel Santee, PT, DPT 07/20/2019 12:01 PM ______________________________________________________________________   Discussed status with Dr. Naaman Plummer on 07/20/19  at 12:20 PM  and received approval for admission today.  Admission Coordinator:  Michel Santee, PT, DPT time 12:20 PM Sudie Grumbling 07/20/19    Assessment/Plan: Diagnosis: debility, bilateral lower ext wounds w/ grafts,vacs 1. Does the need for close, 24 hr/day Medical supervision in concert with  the patient's rehab needs make it unreasonable for this patient to be served in a less intensive setting? Yes 2. Co-Morbidities requiring supervision/potential complications: CHF, HTN, obesity 3. Due to bladder management, bowel management, safety, skin/wound care, disease management, medication administration, pain management and patient education, does the patient require 24 hr/day rehab nursing? Yes 4. Does the patient require coordinated care of a physician, rehab nurse, PT (1-2 hrs/day, 5 days/week) and OT (1-2 hrs/day, 5 days/week) to address physical and functional deficits in the context of the above medical diagnosis(es)? Yes Addressing deficits in the following  areas: balance, endurance, locomotion, strength, transferring, bowel/bladder control, bathing, dressing, feeding, grooming, toileting and psychosocial support 5. Can the patient actively participate in an intensive therapy program of at least 3 hrs of therapy 5 days a week? Yes 6. The potential for patient to make measurable gains while on inpatient rehab is excellent 7. Anticipated functional outcomes upon discharge from inpatients are: modified independent PT, modified independent OT, n/a SLP 8. Estimated rehab length of stay to reach the above functional goals is: 10-14 days 9. Anticipated D/C setting: Home 10. Anticipated post D/C treatments: [place] therapy 11. Overall Rehab/Functional Prognosis: excellent  MD Signature: Meredith Staggers, MD, [facility] 07/20/2019

## 2019-07-20 NOTE — Progress Notes (Signed)
Social Work Assessment and Plan   Patient Details  Name: Kari [name] MRN: [id number] Date of Birth: [birth year]  Today's Date: 07/20/2019  Problem List:  Patient Active Problem List   Diagnosis Date Noted  . Physical debility 07/20/2019  . Idiopathic chronic venous hypertension of lower extremity with ulcer, bilateral ([place]) 07/15/2019  . H/O skin graft 05/13/2019  . CKD (chronic kidney disease), stage III ([place]) 01/13/2019  . Infected wound 12/26/2018  . Idiopathic chronic venous hypertension of both lower extremities with ulcer and inflammation ([place])   . History of fall 05/10/2018  . Hyperkalemia 02/21/2018  . Hypotension 02/21/2018  . Sepsis ([place]) 02/21/2018  . Encounter for central line placement   . Mitral regurgitation 10/30/2017  . Acute kidney injury ([place]) 07/21/2017  . Generalized weakness 07/20/2017  . Shock circulatory ([place])   . Acute respiratory failure with hypoxia ([place])   . Pulmonary edema   . Cardiac arrest ([place]) 06/26/2017  . Ventricular fibrillation ([place])   . Acute on chronic diastolic heart failure ([place])   . Abnormal ECG   . Cellulitis 04/02/2017  . AKI (acute kidney injury) ([place]) 12/17/2016  . Cellulitis and abscess of right lower extremity 12/17/2016  . Syncope and collapse 12/17/2016  . Peripheral edema 12/17/2016  . Hypokalemia 12/17/2016  . Anemia of chronic disease 12/17/2016  . Acute diastolic CHF (congestive heart failure) ([place]) 03/14/2014  . CHF (congestive heart failure) ([place]) 03/12/2014  . Sarcoidosis ([place]) 03/12/2014  . Severe sepsis ([place]) 10/17/2013  . ARF (acute renal failure) ([place]) 10/08/2013  . Cellulitis of multiple sites of lower extremity 10/08/2013  . Morbid obesity ([place]) 10/08/2013  . Volume depletion 10/08/2013  . Venous (peripheral) insufficiency 10/28/2012  . Mediastinal lymphadenopathy 10/16/2012  . Pulmonary nodules 10/16/2012  . 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  Chest pain 09/02/2012  . Asthma   . Hypertension   . Depression   . Venous stasis ulcers ([place]) 07/29/2012  . Varicose veins of lower extremities with ulcer ([place]) 07/08/2012  . Venous ulcer of leg ([place]) 07/08/2012   Past Medical History:  Past Medical History:  Diagnosis Date  . Arthritis   . Asthma   . CHF (congestive heart failure) ([place]) 03/12/2014   a. EF 40-45% by echo in 06/2017 with normal cors by cath. ICD placed following VT arrest  . Depression   . Headache(784.0)   . History of blood transfusion   . Hyperlipidemia   . Hypertension   . Lung nodules   . Myocardial infarction ([place]) 06/26/2014  . Renal disorder    followed by [facility]  . Sarcoidosis   . Ulcer    recurring, from chronic venous insufficiency   Past Surgical History:  Past Surgical History:  Procedure Laterality Date  . cataract surgery Left 07-2013  . I&D EXTREMITY Bilateral 12/31/2018   Procedure: DEBRIDEMENT BILATERAL LEGS, APPLY VAC X 2;  Surgeon: [name], MD;  Location: [place] [facility];  Service: Orthopedics;  Laterality: Bilateral;  . I&D EXTREMITY Bilateral 01/02/2019   Procedure: REPEAT DEBRIDEMENT BILATERAL LEGS, APPLY VAC X 2;  Surgeon: [name], MD;  Location: [place];  Service: Orthopedics;  Laterality: Bilateral;  . I&D EXTREMITY Bilateral 07/15/2019   Procedure: IRRIGATION AND DEBRIDEMENT VENOUS STASIS INSUFFICIENCY ULCERATIONS BILATERAL LOWER EXTREMITIES;  Surgeon: [name], MD;  Location: [place];  Service: Orthopedics;  Laterality: Bilateral;  . ICD IMPLANT N/A 07/05/2017   Procedure: ICD Implant;  Surgeon: Constance Haw,  MD;  Location: [place] [facility];  Service: Cardiovascular;  Laterality: N/A;  . LEFT HEART CATH AND CORONARY ANGIOGRAPHY N/A 07/03/2017   Procedure: Left Heart Cath and Coronary Angiography;  Surgeon: Belva Crome, MD;  Location: [place] [facility];  Service: Cardiovascular;  Laterality: N/A;  . SKIN SPLIT GRAFT Bilateral 01/07/2019   Procedure: SPLIT  THICKNESS SKIN GRAFT BILATERAL LEGS, APPLY VAC;  Surgeon: [name], MD;  Location: [place];  Service: Orthopedics;  Laterality: Bilateral;   Social History:  reports that she has never smoked. She has never used smokeless tobacco. She reports that she does not drink alcohol or use drugs.  Family / Support Systems Marital Status: Single Patient Roles: Parent, Other (Comment)(sibling) Children: Daughter in [place] and granddaughter local Other Supports: Kari [name]-sister [phone] Anticipated Caregiver: Sister Ability/Limitations of Caregiver: supervision-min assist level Caregiver Availability: 24/7 Family Dynamics: Close knit family sister has assisted with her care for the last three years. Daughter and granddaughter are involved also. Pt has church members and friends who are supportive. Pt feels she has good supports.  Social History Preferred language: English Religion: Holiness Cultural Background: No issues Education: HS Read: Yes Write: Yes Employment Status: Disabled Public relations account executive Issues: No issues Guardian/Conservator: None-according to MD pt is capable of making her own decisions while here   Abuse/Neglect Abuse/Neglect Assessment Can Be Completed: Yes Physical Abuse: Denies Verbal Abuse: Denies Sexual Abuse: Denies Exploitation of patient/patient's resources: Denies Self-Neglect: Denies  Emotional Status Pt's affect, behavior and adjustment status: Pt is motivated to do well and finally heal from her skin grafts. She went through this in Jan and has been dealing with this since then. She was not healing and needed to have it again. She was independent and could do for herself like she plans on again. Her sister does help her some. Recent Psychosocial Issues: other health issues Psychiatric History: No history deferred depression screen due to she is optimistic and feels she will do well here and be home soon. She is coping appropriately at this  time Substance Abuse History: No issues  Patient / Family Perceptions, Expectations & Goals Pt/Family understanding of illness & functional limitations: Pt is able to explain her surgery and the reason for it. She is hopeful she will do well and recover from this. She does talk with her MD's and feels she has a good understanding of her treatment plan going forward. She loves Dr. Sharol Given. 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 it."  [employer]: None Premorbid Home Care/DME Agencies: Other (Comment)(Dr [place] office weekly for her legs) Transportation available at discharge: [facility] uses for appointments Resource referrals recommended: Support group (specify)  Discharge Planning Living Arrangements: Other relatives Support Systems: Children, Other relatives, Friends/neighbors, Church/faith community Type of Residence: Private residence Insurance Resources: [employer], [employer] (specify county) Museum/gallery curator Resources: [employer] Screen Referred: No Living Expenses: Education officer, community Management: Patient, Family Does the patient have any problems obtaining your medications?: No Home Management: Both she and sister Patient/Family Preliminary Plans: Return home with sister who was helping her prior to admission. They both help each other and seem to do well together. Pt wants to get back on her feet before discharge from here. Will await  therapy team evaluations and work on discharge needs. Social Work Anticipated Follow Up Needs: HH/OP, Support Group  Clinical Impression Pleasant motivated female who is ready to work and get back to her independent level. She has been through this in 12/2018  and knows what to expect. Her sister is involved and will assist if needed. Will await therapy evaluations and work on discharge needs.  [name] 07/20/2019, 2:43 PM

## 2019-07-20 NOTE — Progress Notes (Signed)
Meredith Staggers, MD  Physician  Physical Medicine and Rehabilitation  PMR Pre-admission  Signed  Date of Service:  07/20/2019 12:01 PM      Related encounter: Admission (Current) from 07/15/2019 in [place]         Show:Clear all '[x]' Manual'[x]' Template'[]' Copied  Added by: '[x]' Meredith Staggers, MD'[x]' Michel Santee, PT  '[]' Hover for details PMR Admission Coordinator Pre-Admission Assessment  Patient: Kari [name] is an 58 y.o., female MRN: [id number] DOB: [birth year] Height: '5\' 7"'  (170.2 cm) Weight: 120.2 kg  Insurance Information HMO:     PPO:      PCP:      IPA:      80/20:      OTHER:  PRIMARY: Medicare Part A and B       Policy#: [id number]      Subscriber: patient CM Name:       Phone#:      Fax#:  Pre-Cert#: verified online      Employer:  Benefits:  Phone #:      Name:  Eff. Date: A and B effective 10/25/15     Deduct: $1408      Out of Pocket Max: n/a      Life Max: n/a CIR: 100%      SNF: 20 full days Outpatient: 80%     Co-Pay: 20% Home Health: 100%      Co-Pay:  DME: 80%     Co-Pay: 20% Providers: pt choice  SECONDARY: Medicaid      Policy#: [id number]    Medicaid Application Date:       Case Manager:  Disability Application Date:       Case Worker:   The "Data Collection Information Summary" for patients in Inpatient Rehabilitation Facilities with attached "Privacy Act [facility] Records" was provided and verbally reviewed with: Patient  Emergency Contact Information         Contact Information    Name Relation Home Work Mobile   Arliss, Hep[place] Sister [phone]        Current Medical History  Patient Admitting Diagnosis: bilateral lower extremity skin grafts and I&D   History of Present Illness: Kari [name] is a 58 y/o female admitted to [facility] for bilateral chronic venous stasis ulcers in bilateral LEs.  She had a skin graft in January 2020 and undergone serial  compression wraps with persistent ulceration.  She presented on 7/22 for repeat debridement and application of skin grafts and wound vacs.  PMH significant for HTN, MI, renal disorder, sarcoidosis, lung nodules, CHF (EF 40-45%).  Pt with plan for wound vac x1 week, and IV antibiotics.  Therapy evaluations were completed and recommendations for CIR were made.  Hospital course pain management and IV abx (complete on 07/20/2019).      Patient's medical record from [facility] has been reviewed by the rehabilitation admission coordinator and physician.  Past Medical History      Past Medical History:  Diagnosis Date  . Arthritis   . Asthma   . CHF (congestive heart failure) ([place]) 03/12/2014   a. EF 40-45% by echo in 06/2017 with normal cors by cath. ICD placed following VT arrest  . Depression   . Headache(784.0)   . History of blood transfusion   . Hyperlipidemia   . Hypertension   . Lung nodules   . Myocardial infarction ([place]) 06/26/2014  . Renal disorder    followed by [facility]  .  Sarcoidosis   . Ulcer    recurring, from chronic venous insufficiency    Family History   family history includes Cancer in her mother; Diabetes Mellitus II in her father and mother; Heart disease in her father; Heart failure in her mother; Hypertension in her mother; Stroke in her father.  Prior Rehab/Hospitalizations Has the patient had prior rehab or hospitalizations prior to admission? Yes  Has the patient had major surgery during 100 days prior to admission? Yes             Current Medications  Current Facility-Administered Medications:  .  0.9 %  sodium chloride infusion, , Intravenous, Continuous, [name], MD, Last Rate: 10 mL/hr at 07/15/19 1336 .  0.9 %  sodium chloride infusion, , Intravenous, Continuous, [name], MD, Last Rate: 10 mL/hr at 07/19/19 2313 .  acetaminophen (TYLENOL) tablet 325-650 mg, 325-650 mg, Oral, Q6H PRN, [name], MD  .  albuterol (PROVENTIL) (2.5 MG/3ML) 0.083% nebulizer solution 2.5 mg, 2.5 mg, Inhalation, Q6H PRN, [name], MD .  allopurinol (ZYLOPRIM) tablet 100 mg, 100 mg, Oral, BID, [name], MD, 100 mg at 07/20/19 0907 .  aspirin EC tablet 81 mg, 81 mg, Oral, Daily, [name], MD, 81 mg at 07/20/19 0907 .  bisacodyl (DULCOLAX) suppository 10 mg, 10 mg, Rectal, Daily PRN, [name], MD .  carvedilol (COREG) tablet 6.25 mg, 6.25 mg, Oral, BID, [name], MD, 6.25 mg at 07/20/19 0907 .  ceFAZolin (ANCEF) IVPB 2g/100 mL premix, 2 g, Intravenous, Q8H, [name], MD, Last Rate: 200 mL/hr at 07/20/19 0632, 2 g at 07/20/19 5027 .  docusate sodium (COLACE) capsule 100 mg, 100 mg, Oral, BID, [name], MD, 100 mg at 07/20/19 0907 .  furosemide (LASIX) tablet 60 mg, 60 mg, Oral, Daily, [name], MD, 60 mg at 07/20/19 0907 .  HYDROmorphone (DILAUDID) injection 0.5-1 mg, 0.5-1 mg, Intravenous, Q4H PRN, [name], MD, 1 mg at 07/19/19 0538 .  lactated ringers infusion, , Intravenous, Continuous, [name], MD, Last Rate: 50 mL/hr at 07/17/19 0908 .  magnesium citrate solution 1 Bottle, 1 Bottle, Oral, Once PRN, [name], MD .  methocarbamol (ROBAXIN) tablet 500 mg, 500 mg, Oral, Q6H PRN, 500 mg at 07/19/19 2317 **OR** methocarbamol (ROBAXIN) 500 mg in dextrose 5 % 50 mL IVPB, 500 mg, Intravenous, Q6H PRN, [name], MD .  metoCLOPramide (REGLAN) tablet 5-10 mg, 5-10 mg, Oral, Q8H PRN **OR** metoCLOPramide (REGLAN) injection 5-10 mg, 5-10 mg, Intravenous, Q8H PRN, [name], MD .  ondansetron (ZOFRAN) tablet 4 mg, 4 mg, Oral, Q6H PRN **OR** ondansetron (ZOFRAN) injection 4 mg, 4 mg, Intravenous, Q6H PRN, [name], MD .  oxyCODONE (Oxy IR/ROXICODONE) immediate release tablet 10-15 mg, 10-15 mg, Oral, Q4H PRN, [name], MD, 10 mg at 07/20/19 0908 .  oxyCODONE (Oxy IR/ROXICODONE) immediate release tablet 5-10 mg, 5-10 mg, Oral, Q4H PRN, [name], MD, 10 mg at 07/19/19 2318 .  polyethylene glycol (MIRALAX / GLYCOLAX) packet 17 g, 17 g, Oral, Daily PRN, [name], MD, 17 g at 07/19/19 1950  Patients Current Diet:     Diet Order                  Diet Carb Modified Fluid consistency: Thin; Room service appropriate? Yes  Diet effective now  Precautions / Restrictions Precautions Precautions: Fall Precaution Comments: B wound vacs Restrictions Weight Bearing Restrictions: Yes RLE Weight Bearing: Weight bearing as tolerated LLE Weight Bearing: Weight bearing as tolerated   Has the patient had 2 or more falls or a fall with injury in the past year? No  Prior Activity Level Limited Community (1-2x/wk): used transport for dr's appointments, didn't drive   Prior Functional Level Self Care: Did the patient need help bathing, dressing, using the toilet or eating? Independent  Indoor Mobility: Did the patient need assistance with walking from room to room (with or without device)? Independent  Stairs: Did the patient need assistance with internal or external stairs (with or without device)? Independent  Functional Cognition: Did the patient need help planning regular tasks such as shopping or remembering to take medications? Independent  Home Assistive Devices / Equipment Home Assistive Devices/Equipment: Eyeglasses, Radio producer (specify quad or straight), Blood pressure cuff Home Equipment: Walker - 2 wheels, Cane - single point  Prior Device Use: Indicate devices/aids used by the patient prior to current illness, exacerbation or injury? occasionally used SPC, rollator, or RW  Current Functional Level Cognition  Overall Cognitive Status: Within Functional Limits for tasks assessed Orientation Level: Oriented X4 General Comments: pt is motivated to move herself    Extremity Assessment (includes Sensation/Coordination)  Upper Extremity Assessment: Overall WFL for tasks assessed   Lower Extremity Assessment: Defer to PT evaluation    ADLs  Overall ADL's : Needs assistance/impaired Eating/Feeding: Independent Eating/Feeding Details (indicate cue type and reason): pt requesting cereal and stating she does not like the delivered meal tray. very direct in meal request for food Grooming: Wash/dry face, Modified independent, Sitting Upper Body Bathing: Minimal assistance Lower Body Bathing: Maximal assistance Upper Body Dressing : Minimal assistance Lower Body Dressing: Maximal assistance Lower Body Dressing Details (indicate cue type and reason): pt is able to reach calf area but not feet at this time. pt with body habitus preventing full reach  Toilet Transfer: Minimal assistance, Stand-pivot Toileting- Clothing Manipulation and Hygiene: Maximal assistance General ADL Comments: pt with full control of bladder and bowel so recommending that RN staff utilize the [facility] at this time and d/c the pure wick. Tech arriving and also notified of the dc of the pure wick.     Mobility  Overal bed mobility: Needs Assistance Bed Mobility: Supine to Sit Supine to sit: Supervision General bed mobility comments: HOB elevated and able to advance bil LE off the eob and pulling up with R side of the bed rail. pt with HOB >45 degrees to progress to eob    Transfers  Overall transfer level: Needs assistance Equipment used: Rolling walker (2 wheeled) Transfers: Sit to/from Stand Sit to Stand: Min assist Stand pivot transfers: Min assist General transfer comment: pt requires verbal cues for safety with RW and to place bil le against the chair prior to sitting. pt noted to have body habitus taht prevents bil LE inside the RW to sit<>Stand. pt coudl benefit from bariatric RW    Ambulation / Gait / Stairs / Wheelchair Mobility  Ambulation/Gait Ambulation/Gait assistance: Mod assist, +2 safety/equipment Gait Distance (Feet): 12 Feet Assistive device: Rolling walker (2 wheeled) Gait  Pattern/deviations: Wide base of support, Decreased step length - right, Decreased step length - left, Trunk flexed General Gait Details: Dependent on UE support; cues to self-monitor for activity tolerance Gait velocity: quite slow    Posture / Balance Balance Overall balance assessment: Needs assistance Sitting-balance support: Feet supported Sitting  balance-Leahy Scale: Good Standing balance support: Bilateral upper extremity supported Standing balance-Leahy Scale: Fair Standing balance comment: reliant on RW    Special needs/care consideration BiPAP/CPAP no CPM no Continuous Drip IV no Dialysis no        Days n/a Life Vest no Oxygen no Special Bed no Trach Size no Wound Vac (area) yes      Location bilat LE Skin incisions to BLE               Bowel mgmt: continent, last BM 07/15/2019 Bladder mgmt: continent Diabetic mgmt: no Behavioral consideration no Chemo/radiation no   Previous Home Environment (from acute therapy documentation) Living Arrangements: Other relatives Available Help at Discharge: Family Type of Home: House Home Layout: One level Home Access: Level entry Bathroom Shower/Tub: Chiropodist: Standard Bathroom Accessibility: Yes Home Care Services: No Additional Comments: lives with younger sister and sister is there at all times. Pt has a daughter with an 7 yr grandchild that can provided PRN  Discharge Living Setting Plans for Discharge Living Setting: Patient's home, Lives with (comment) Type of Home at Discharge: Other (Comment)(boarding house) Discharge Home Layout: One level Discharge Home Access: Stairs to enter Entrance Stairs-Rails: Left Entrance Stairs-Number of Steps: 8 Discharge Bathroom Shower/Tub: Tub/shower unit Discharge Bathroom Toilet: Standard Discharge Bathroom Accessibility: Yes How Accessible: Accessible via walker Does the patient have any problems obtaining your medications?: No  Social/Family/Support  Systems Patient Roles: Parent Anticipated Caregiver: sister, Vaughan Basta Anticipated Ambulance person Information: [phone] Ability/Limitations of Caregiver: min assist Caregiver Availability: 24/7 Discharge Plan Discussed with Primary Caregiver: Yes Is Caregiver In Agreement with Plan?: Yes Does Caregiver/Family have Issues with Lodging/Transportation while Pt is in Rehab?: No  Goals/Additional Needs Patient/Family Goal for Rehab: PT/OT mod I Expected length of stay: 10-14 days Dietary Needs: carb modified/thin Equipment Needs: tbd Pt/Family Agrees to Admission and willing to participate: Yes Program Orientation Provided & Reviewed with Pt/Caregiver Including Roles  & Responsibilities: Yes  Decrease burden of Care through IP rehab admission: n/a  Possible need for SNF placement upon discharge: no  Patient Condition: I have reviewed medical records from [facility], spoken with CSW, and patient and family member. I met with patient at the bedside and discussed with sister, Vaughan Basta, via phone for inpatient rehabilitation assessment.  Patient will benefit from ongoing PT and OT, can actively participate in 3 hours of therapy a day 5 days of the week, and can make measurable gains during the admission.  Patient will also benefit from the coordinated team approach during an Inpatient Acute Rehabilitation admission.  The patient will receive intensive therapy as well as Rehabilitation physician, nursing, social worker, and care management interventions.  Due to safety, skin/wound care, disease management, medication administration, pain management and patient education the patient requires 24 hour a day rehabilitation nursing.  The patient is currently min with mobility and max with basic ADLs.  Discharge setting and therapy post discharge at home with home health is anticipated.  Patient has agreed to participate in the Acute Inpatient Rehabilitation Program and will admit today.   Preadmission Screen Completed By:  Michel Santee, PT, DPT 07/20/2019 12:01 PM ______________________________________________________________________   Discussed status with Dr. Naaman Plummer on 07/20/19  at 12:20 PM  and received approval for admission today.  Admission Coordinator:  Michel Santee, PT, DPT time 12:20 PM Sudie Grumbling 07/20/19    Assessment/Plan: Diagnosis: debility, bilateral lower ext wounds w/ grafts,vacs 1. Does the need for close, 24 hr/day Medical supervision  in concert with the patient's rehab needs make it unreasonable for this patient to be served in a less intensive setting? Yes 2. Co-Morbidities requiring supervision/potential complications: CHF, HTN, obesity 3. Due to bladder management, bowel management, safety, skin/wound care, disease management, medication administration, pain management and patient education, does the patient require 24 hr/day rehab nursing? Yes 4. Does the patient require coordinated care of a physician, rehab nurse, PT (1-2 hrs/day, 5 days/week) and OT (1-2 hrs/day, 5 days/week) to address physical and functional deficits in the context of the above medical diagnosis(es)? Yes Addressing deficits in the following areas: balance, endurance, locomotion, strength, transferring, bowel/bladder control, bathing, dressing, feeding, grooming, toileting and psychosocial support 5. Can the patient actively participate in an intensive therapy program of at least 3 hrs of therapy 5 days a week? Yes 6. The potential for patient to make measurable gains while on inpatient rehab is excellent 7. Anticipated functional outcomes upon discharge from inpatients are: modified independent PT, modified independent OT, n/a SLP 8. Estimated rehab length of stay to reach the above functional goals is: 10-14 days 9. Anticipated D/C setting: Home 10. Anticipated post D/C treatments: [place] therapy 11. Overall Rehab/Functional Prognosis: excellent  MD Signature: Meredith Staggers,  MD, [facility] 07/20/2019         Revision History

## 2019-07-20 NOTE — IPOC Note (Signed)
Individualized overall Plan of Care [facility]) Patient Details Name: CRESCENTIA BOUTWELL MRN: [id number] DOB: [birth year]  Admitting Diagnosis: [facility] Problems: Active Problems:   Physical debility     Functional Problem List: Nursing Bowel, Endurance, Pain, Skin Integrity, Safety  PT Balance, Edema, Endurance, Motor, Pain, Nutrition, Perception, Safety, Skin Integrity  OT Balance, Endurance, Motor, Pain, Safety  SLP    TR         Basic ADL's: OT Grooming, Bathing, Dressing, Toileting     Advanced  ADL's: OT Light Housekeeping     Transfers: PT Bed Mobility, Bed to Chair, Car, [employer], Futures trader, Metallurgist: PT Ambulation, Emergency planning/management officer, Stairs     Additional Impairments: OT None  SLP        TR      Anticipated Outcomes Item Anticipated Outcome  Self Feeding    Swallowing      Basic self-care  Mod I  Toileting  Mod I   Bathroom Transfers Mod I  Bowel/Bladder  Patient to continue to be continent of bowel and bladder during admission.  Transfers  mod I with LRAD  Locomotion  mod I with LRAD  Communication     Cognition     Pain  Patient to be pain free or pain less than 3 during admission  Safety/Judgment  Patient to be free from falls and adhere to safety plan   Therapy Plan: PT Intensity: Minimum of 1-2 x/day ,45 to 90 minutes PT Frequency: 5 out of 7 days PT Duration Estimated Length of Stay: 7-10 days OT Intensity: Minimum of 1-2 x/day, 45 to 90 minutes OT Frequency: 5 out of 7 days OT Duration/Estimated Length of Stay: 7-10 days      Team Interventions: Nursing Interventions Patient/Family Education, Pain Management, Disease Management/Prevention, Bowel Management, Skin Care/Wound Management  PT interventions Ambulation/gait training, Discharge planning, Functional mobility training, Psychosocial support, Therapeutic Activities, Balance/vestibular training, Disease management/prevention, Neuromuscular  re-education, Skin care/wound management, Therapeutic Exercise, Wheelchair propulsion/positioning, DME/adaptive equipment instruction, Pain management, Splinting/orthotics, UE/LE Strength taining/ROM, Community reintegration, Technical sales engineer stimulation, Barrister's clerk education, IT trainer, UE/LE Coordination activities  OT Interventions Training and development officer, Discharge planning, Disease mangement/prevention, DME/adaptive equipment instruction, Functional mobility training, Pain management, Patient/family education, Psychosocial support, Self Care/advanced ADL retraining, Skin care/wound managment, Therapeutic Activities, Therapeutic Exercise  SLP Interventions    TR Interventions    SW/CM Interventions Discharge Planning, Psychosocial Support, Patient/Family Education   Barriers to Discharge MD  Medical stability, Wound care and Weight  Nursing      PT Home environment access/layout, Wound Care B wound vacs on LE wounds; 8 steps with L rail to get to second floor apartment  OT Home environment access/layout bedroom upstairs  SLP      SW       Team Discharge Planning: Destination: PT-Home ,OT- Home , SLP-  Projected Follow-up: PT-Home health PT, OT-  Home health OT, SLP-  Projected Equipment Needs: PT-To be determined, OT- 3 in 1 bedside comode, Tub/shower bench, SLP-  Equipment Details: PT-has a cane, will determine RW vs rollator or other DME, OT-  Patient/family involved in discharge planning: PT- Patient,  OT-Patient, SLP-   MD ELOS: 6-9 days. Medical Rehab Prognosis:  Excellent Assessment: 58 year old female with history of HTN, ICM/VF s/p ICD, CKD, moribd obesity, venous statis ulcers s/p I &D with poor healing and was admitted on 07/15/19 for repeat I &D with placement of prevena VAC by Dr. Sharol Given. She was taken  back to OR repeat I and D with STSG and has been maintained on IV antibiotics thorough 07/20/2019. Wound VAC in place.  Patient with resulting functional  deficits with mobility, transfers, self-care.  We will set goals for Mod I with PT/OT.   Due to the current state of emergency, patients may not be receiving their 3-hours of Medicare-mandated therapy.  See Team Conference Notes for weekly updates to the plan of care

## 2019-07-20 NOTE — Plan of Care (Signed)

## 2019-07-21 ENCOUNTER — Inpatient Hospital Stay (HOSPITAL_COMMUNITY): Payer: Medicare Other

## 2019-07-21 ENCOUNTER — Inpatient Hospital Stay (HOSPITAL_COMMUNITY): Payer: Medicare Other | Admitting: Physical Therapy

## 2019-07-21 ENCOUNTER — Inpatient Hospital Stay (HOSPITAL_COMMUNITY): Payer: Medicare Other | Admitting: Occupational Therapy

## 2019-07-21 DIAGNOSIS — N183 Chronic kidney disease, stage 3 (moderate): Secondary | ICD-10-CM

## 2019-07-21 DIAGNOSIS — D638 Anemia in other chronic diseases classified elsewhere: Secondary | ICD-10-CM

## 2019-07-21 LAB — CBC WITH DIFFERENTIAL/PLATELET
Abs Immature Granulocytes: 0.06 10*3/uL (ref 0.00–0.07)
Basophils Absolute: 0.1 10*3/uL (ref 0.0–0.1)
Basophils Relative: 1 %
Eosinophils Absolute: 0.4 10*3/uL (ref 0.0–0.5)
Eosinophils Relative: 4 %
HCT: 33.5 % — ABNORMAL LOW (ref 36.0–46.0)
Hemoglobin: 10.2 g/dL — ABNORMAL LOW (ref 12.0–15.0)
Immature Granulocytes: 1 %
Lymphocytes Relative: 18 %
Lymphs Abs: 1.7 10*3/uL (ref 0.7–4.0)
MCH: 27.9 pg (ref 26.0–34.0)
MCHC: 30.4 g/dL (ref 30.0–36.0)
MCV: 91.5 fL (ref 80.0–100.0)
Monocytes Absolute: 0.8 10*3/uL (ref 0.1–1.0)
Monocytes Relative: 9 %
Neutro Abs: 6.3 10*3/uL (ref 1.7–7.7)
Neutrophils Relative %: 67 %
Platelets: 324 10*3/uL (ref 150–400)
RBC: 3.66 MIL/uL — ABNORMAL LOW (ref 3.87–5.11)
RDW: 15.5 % (ref 11.5–15.5)
WBC: 9.3 10*3/uL (ref 4.0–10.5)
nRBC: 0 % (ref 0.0–0.2)

## 2019-07-21 LAB — COMPREHENSIVE METABOLIC PANEL
ALT: 7 U/L (ref 0–44)
AST: 15 U/L (ref 15–41)
Albumin: 2.2 g/dL — ABNORMAL LOW (ref 3.5–5.0)
Alkaline Phosphatase: 78 U/L (ref 38–126)
Anion gap: 9 (ref 5–15)
BUN: 22 mg/dL — ABNORMAL HIGH (ref 6–20)
CO2: 32 mmol/L (ref 22–32)
Calcium: 9 mg/dL (ref 8.9–10.3)
Chloride: 95 mmol/L — ABNORMAL LOW (ref 98–111)
Creatinine, Ser: 1.17 mg/dL — ABNORMAL HIGH (ref 0.44–1.00)
GFR calc Af Amer: 60 mL/min — ABNORMAL LOW (ref >60)
GFR calc non Af Amer: 52 mL/min — ABNORMAL LOW (ref >60)
Glucose, Bld: 115 mg/dL — ABNORMAL HIGH (ref 70–99)
Potassium: 3.5 mmol/L (ref 3.5–5.1)
Sodium: 136 mmol/L (ref 135–145)
Total Bilirubin: 0.6 mg/dL (ref 0.3–1.2)
Total Protein: 8.1 g/dL (ref 6.5–8.1)

## 2019-07-21 MED ORDER — ENSURE ENLIVE PO LIQD
237.0000 mL | Freq: Two times a day (BID) | ORAL | Status: DC
  Administered 2019-07-22 – 2019-07-24 (×6): 237 mL via ORAL

## 2019-07-21 MED ORDER — JUVEN PO PACK
1.0000 | PACK | Freq: Two times a day (BID) | ORAL | Status: DC
  Administered 2019-07-22 – 2019-07-25 (×6): 1 via ORAL
  Filled 2019-07-21 (×6): qty 1

## 2019-07-21 NOTE — Progress Notes (Signed)
Physical Therapy Assessment and Plan  Patient Details  Name: Kari [name] MRN: [id number] Date of Birth: 06/20/1961  PT Diagnosis: Abnormality of gait, Difficulty walking, Dizziness and giddiness, Edema, Muscle weakness and Pain in B LE wounds Rehab Potential: Excellent ELOS: 7-10 days   Today's Date: 07/21/2019 PT Individual Time: 3220-2542 PT Individual Time Calculation (min): 80 min    Problem List:  Patient Active Problem List   Diagnosis Date Noted  . Physical debility 07/20/2019  . Idiopathic chronic venous hypertension of lower extremity with ulcer, bilateral ([place]) 07/15/2019  . H/O skin graft 05/13/2019  . CKD (chronic kidney disease), stage III ([place]) 01/13/2019  . Infected wound 12/26/2018  . Idiopathic chronic venous hypertension of both lower extremities with ulcer and inflammation ([place])   . History of fall 05/10/2018  . Hyperkalemia 02/21/2018  . Hypotension 02/21/2018  . Sepsis ([place]) 02/21/2018  . Encounter for central line placement   . Mitral regurgitation 10/30/2017  . Acute kidney injury ([place]) 07/21/2017  . Generalized weakness 07/20/2017  . Shock circulatory ([place])   . Acute respiratory failure with hypoxia ([place])   . Pulmonary edema   . Cardiac arrest ([place]) 06/26/2017  . Ventricular fibrillation ([place])   . Acute on chronic diastolic heart failure ([place])   . Abnormal ECG   . Cellulitis 04/02/2017  . AKI (acute kidney injury) ([place]) 12/17/2016  . Cellulitis and abscess of right lower extremity 12/17/2016  . Syncope and collapse 12/17/2016  . Peripheral edema 12/17/2016  . Hypokalemia 12/17/2016  . Anemia of chronic disease 12/17/2016  . Acute diastolic CHF (congestive heart failure) ([place]) 03/14/2014  . CHF (congestive heart failure) ([place]) 03/12/2014  . Sarcoidosis ([place]) 03/12/2014  . Severe sepsis ([place]) 10/17/2013  . ARF (acute renal failure) ([place]) 10/08/2013  . Cellulitis of multiple sites of lower extremity 10/08/2013  . Morbid obesity ([place])  10/08/2013  . Volume depletion 10/08/2013  . Venous (peripheral) insufficiency 10/28/2012  . Mediastinal lymphadenopathy 10/16/2012  . Pulmonary nodules 10/16/2012  . 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 Chest pain 09/02/2012  . Asthma   . Hypertension   . Depression   . Venous stasis ulcers ([place]) 07/29/2012  . Varicose veins of lower extremities with ulcer ([place]) 07/08/2012  . Venous ulcer of leg ([place]) 07/08/2012    Past Medical History:  Past Medical History:  Diagnosis Date  . Arthritis   . Asthma   . CHF (congestive heart failure) ([place]) 03/12/2014   a. EF 40-45% by echo in 06/2017 with normal cors by cath. ICD placed following VT arrest  . Depression   . Headache(784.0)   . History of blood transfusion   . Hyperlipidemia   . Hypertension   . Lung nodules   . Myocardial infarction ([place]) 06/26/2014  . Renal disorder    followed by [facility]  . Sarcoidosis   . Ulcer    recurring, from chronic venous insufficiency   Past Surgical History:  Past Surgical History:  Procedure Laterality Date  . cataract surgery Left 07-2013  . I&D EXTREMITY Bilateral 12/31/2018   Procedure: DEBRIDEMENT BILATERAL LEGS, APPLY VAC X 2;  Surgeon: [name], MD;  Location: [facility];  Service: Orthopedics;  Laterality: Bilateral;  . I&D EXTREMITY Bilateral 01/02/2019   Procedure: REPEAT DEBRIDEMENT BILATERAL LEGS, APPLY VAC X 2;  Surgeon: [name], MD;  Location: [facility];  Service: Orthopedics;  Laterality: Bilateral;  . I&D EXTREMITY Bilateral 07/15/2019   Procedure: IRRIGATION AND  DEBRIDEMENT VENOUS STASIS INSUFFICIENCY ULCERATIONS BILATERAL LOWER EXTREMITIES;  Surgeon: [name], MD;  Location: [facility];  Service: Orthopedics;  Laterality: Bilateral;  . ICD IMPLANT N/A 07/05/2017   Procedure: ICD Implant;  Surgeon: Constance Haw, MD;  Location: [facility];  Service: Cardiovascular;  Laterality: N/A;  . LEFT HEART  CATH AND CORONARY ANGIOGRAPHY N/A 07/03/2017   Procedure: Left Heart Cath and Coronary Angiography;  Surgeon: Belva Crome, MD;  Location: [facility];  Service: Cardiovascular;  Laterality: N/A;  . SKIN SPLIT GRAFT Bilateral 01/07/2019   Procedure: SPLIT THICKNESS SKIN GRAFT BILATERAL LEGS, APPLY VAC;  Surgeon: [name], MD;  Location: [facility];  Service: Orthopedics;  Laterality: Bilateral;  . SKIN SPLIT GRAFT Bilateral 07/17/2019   Procedure: REPEAT IRRIGATION AND DEBRIDEMENT BILATERAL LOWER EXTREMITIES, SKIN GRAFT;  Surgeon: [name], MD;  Location: [facility];  Service: Orthopedics;  Laterality: Bilateral;    Assessment & Plan Clinical Impression: Patient is a 58 y.o. year old female with history of HTN, ICM/VF s/p ICD, CKD, moribd obesity, venous statis ulcers s/p I &D with poor healing and was admitted on 07/15/19 for repeat I &D with placement of prevena VAC by Dr. Sharol Given. She was taken back to OR repeat I and D with STSG and has been maintained on IV antibiotics thorough today. Wound VAC to remain. Patient transferred to CIR on 07/20/2019 .   Patient currently requires min with mobility secondary to muscle weakness, decreased cardiorespiratoy endurance and decreased standing balance, decreased postural control and decreased balance strategies.  Prior to hospitalization, patient was modified independent  with mobility and lived with Family(sister) in a House home.  Home access is  Level entry.  Patient will benefit from skilled PT intervention to maximize safe functional mobility, minimize fall risk and decrease caregiver burden for planned discharge home with intermittent assist.  Anticipate patient will benefit from follow up [facility] at discharge.  PT - End of Session Activity Tolerance: Tolerates 30+ min activity with multiple rests Endurance Deficit: Yes Endurance Deficit Description: requires multiple rest breaks PT Assessment Rehab Potential (ACUTE/IP ONLY): Excellent PT  Barriers to Discharge: Home environment access/layout;Wound Care PT Barriers to Discharge Comments: B wound vacs on LE wounds; 8 steps with L rail to get to second floor apartment PT Patient demonstrates impairments in the following area(s): Balance;Edema;Endurance;Motor;Pain;Nutrition;Perception;Safety;Skin Integrity PT Transfers Functional Problem(s): Bed Mobility;Bed to Chair;Car;Furniture;Floor PT Locomotion Functional Problem(s): Ambulation;Wheelchair Mobility;Stairs PT Plan PT Intensity: Minimum of 1-2 x/day ,45 to 90 minutes PT Frequency: 5 out of 7 days PT Duration Estimated Length of Stay: 7-10 days PT Treatment/Interventions: Ambulation/gait training;Discharge planning;Functional mobility training;Psychosocial support;Therapeutic Activities;Balance/vestibular training;Disease management/prevention;Neuromuscular re-education;Skin care/wound management;Therapeutic Exercise;Wheelchair propulsion/positioning;DME/adaptive equipment instruction;Pain management;Splinting/orthotics;UE/LE Strength taining/ROM;Community reintegration;Functional electrical stimulation;Patient/family education;Stair training;UE/LE Coordination activities PT Transfers Anticipated Outcome(s): mod I with LRAD PT Locomotion Anticipated Outcome(s): mod I with LRAD PT Recommendation Follow Up Recommendations: Home health PT Patient destination: Home Equipment Recommended: To be determined Equipment Details: has a cane, will determine RW vs rollator or other DME  Skilled Therapeutic Intervention  In addition to the PT evaluation below, the patient participated in the following skilled PT interventions:  Patient in bed upon PT arrival. Patient alert and agreeable to PT session.  Therapeutic Activity: Bed Mobility: See details below. Did have mild dizziness upon sitting up. Quickly resolved sitting EOB. Reported no other occurances of dizziness throughout session.  Transfers: Patient performed sit to/from stand x1  with min A using the RW with. Provided  verbal cues for hand placement and leaning far forward to stand. Patient then performed sit to/from stand x3, stand pivot x2, and a car transfer with CGA using the RW following cues.   Gait Training:  Patient ambulated 160 feet using RW with CGA, limited by B LE pain. Gait details below. Provided verbal cues for erect posture, looking ahead, and staying close to the RW. Patient was wearing a long dress and PT assisted with tieing the dress up to avoid stepping on the end.  Stair details below.   Wheelchair Mobility:  Patient propelled wheelchair 55 feet with min A with increased time and increased fatigue following. Provided verbal cues for propulsion and turning technique. Required set up assist for leg rest and use of breaks throughout session.   Patient in bed at end of session with breaks locked, bed alarm set, and all needs within reach.   Instructed pt in results of PT evaluation as detailed below, PT POC, rehab potential, rehab goals, and discharge recommendations. Additionally discussed CIR's policies regarding fall safety and use of chair alarm and/or quick release belt. Pt verbalized understanding and in agreement. Will update pt's family members as they become available.    PT Evaluation Precautions/Restrictions Precautions Precautions: Fall Precaution Comments: B wound vacs Restrictions Weight Bearing Restrictions: Yes RLE Weight Bearing: Weight bearing as tolerated LLE Weight Bearing: Weight bearing as tolerated   Vital Signs   BP: 126/83 HR: 70 SPO2: 100% on RA Pain Pain Assessment Pain Scale: 0-10 Pain Score: 8  Pain Type: Acute pain;Surgical pain Pain Location: Leg Pain Orientation: Right;Left Pain Descriptors / Indicators: Constant;Sharp Pain Onset: On-going Pain Intervention(s): Rest;Repositioned;Distraction Home Living/Prior Functioning Home Living Available Help at Discharge: Family Type of Home: House Home Access:  Level entry Home Layout: Two level Alternate Level Stairs-Number of Steps: pt states about 8 steps Alternate Level Stairs-Rails: Left Bathroom Shower/Tub: Chiropodist: Standard Bathroom Accessibility: Yes Additional Comments: lives with younger sister and sister is there at all times. Pt has a daughter with an 7 yr grandchild that can provided PRN  Lives With: Family(sister) Prior Function Level of Independence: Independent with basic ADLs;Requires assistive device for independence;Needs assistance with homemaking  Able to Take Stairs?: Yes Driving: No Vocation: On disability Leisure: Hobbies-yes (Comment) Comments: does crossword puzzles and spends time with her grandson Vision/Perception  Perception Perception: Within Functional Limits Praxis Praxis: Intact  Cognition Overall Cognitive Status: Within Functional Limits for tasks assessed Arousal/Alertness: Awake/alert Orientation Level: Oriented X4 Attention: Alternating Memory: Appears intact Awareness: Appears intact Problem Solving: Appears intact Safety/Judgment: Appears intact Sensation Sensation Light Touch: Appears Intact Coordination Gross Motor Movements are Fluid and Coordinated: No Fine Motor Movements are Fluid and Coordinated: Yes Coordination and Movement Description: decreased gross motor movements due to B LE pain and decreased activity tolerance Motor  Motor Motor: Other (comment) Motor - Skilled Clinical Observations: decreased gross motor movements due to B LE pain and decreased activity tolerance  Mobility Bed Mobility Bed Mobility: Rolling Right;Rolling Left;Supine to Sit;Sit to Supine Rolling Right: Supervision/verbal cueing(with use of bed rail) Rolling Left: Supervision/Verbal cueing(with use of bed rail) Supine to Sit: Supervision/Verbal cueing(with use of bed rail) Sit to Supine: Supervision/Verbal cueing(with use of bed rail) Transfers Transfers: Sit to Stand;Stand to  Sit;Stand Pivot Transfers Sit to Stand: Minimal Assistance - Patient > 75% Stand to Sit: Contact Guard/Touching assist Stand Pivot Transfers: Contact Guard/Touching assist Transfer (Assistive device): Rolling walker Locomotion  Gait Ambulation: Yes Gait Assistance: Contact Guard/Touching assist Gait  Distance (Feet): 160 Feet Assistive device: Rolling walker(bariatric) Gait Gait: Yes Gait Pattern: Step-through pattern;Decreased step length - right;Decreased step length - left;Decreased hip/knee flexion - right;Decreased hip/knee flexion - left;Right foot flat;Left foot flat;Decreased trunk rotation;Wide base of support Gait velocity: decreased Stairs / Additional Locomotion Stairs: Yes Stairs Assistance: Contact Guard/Touching assist Stair Management Technique: Two rails Number of Stairs: 8 Height of Stairs: 6 Wheelchair Mobility Wheelchair Mobility: Yes Wheelchair Assistance: Minimal assistance - Patient >75% Wheelchair Propulsion: Both upper extremities Wheelchair Parts Management: Needs assistance Distance: 52'  Trunk/Postural Assessment  Cervical Assessment Cervical Assessment: Within Functional Limits Thoracic Assessment Thoracic Assessment: Within Functional Limits Lumbar Assessment Lumbar Assessment: Within Functional Limits Postural Control Postural Control: Deficits on evaluation(decreased)  Balance Balance Balance Assessed: Yes Static Sitting Balance Static Sitting - Balance Support: No upper extremity supported;Feet supported Static Sitting - Level of Assistance: 7: Independent Dynamic Sitting Balance Dynamic Sitting - Balance Support: No upper extremity supported;Feet supported Dynamic Sitting - Level of Assistance: 7: Independent Dynamic Sitting - Balance Activities: Lateral lean/weight shifting;Forward lean/weight shifting;Reaching for objects Static Standing Balance Static Standing - Balance Support: Right upper extremity supported;Left upper extremity  supported Static Standing - Level of Assistance: 5: Stand by assistance Dynamic Standing Balance Dynamic Standing - Balance Support: Right upper extremity supported;Left upper extremity supported Dynamic Standing - Level of Assistance: 5: Stand by assistance Dynamic Standing - Balance Activities: Lateral lean/weight shifting;Forward lean/weight shifting;Reaching for objects Extremity Assessment  RUE Assessment RUE Assessment: Within Functional Limits Active Range of Motion (AROM) Comments: WFL for all functional mobility General Strength Comments: WFL for all functional mobility LUE Assessment LUE Assessment: Within Functional Limits Active Range of Motion (AROM) Comments: WFL for all functional mobility General Strength Comments: WFL for all functional mobility RLE Assessment RLE Assessment: Exceptions to [facility] Active Range of Motion (AROM) Comments: WFL for all functional mobility, noted limited DF to neutral General Strength Comments: Grossly in sitting 4+/5 throughout, knee flexion extension at least 3+, limited testing due to lower limb pain LLE Assessment LLE Assessment: Exceptions to [facility] Active Range of Motion (AROM) Comments: WFL for all functional mobility, noted limited DF to neutral General Strength Comments: Grossly in sitting 4+/5 throughout, knee flexion extension at least 3+, limited testing due to lower limb pain    Refer to Care Plan for Long Term Goals  Recommendations for other services: None   Discharge Criteria: Patient will be discharged from PT if patient refuses treatment 3 consecutive times without medical reason, if treatment goals not met, if there is a change in medical status, if patient makes no progress towards goals or if patient is discharged from hospital.  The above assessment, treatment plan, treatment alternatives and goals were discussed and mutually agreed upon: by patient  Doreene Burke PT, DPT  07/21/2019, 12:42 PM

## 2019-07-21 NOTE — Progress Notes (Addendum)
Physical Therapy Session Note  Patient Details  Name: Kari [name] MRN: [id number] Date of Birth: [birth year]  Today's Date: 07/21/2019 PT Individual Time: 1400-1440 PT Individual Time Calculation (min): 40 min   Short Term Goals: Week 1:  PT Short Term Goal 1 (Week 1): STG=LTG due to short ELOS.  Skilled Therapeutic Interventions/Progress Updates:   Pt in supine and agreeable to therapy, pain 7/10 in BLEs and pt requesting to perform therapy in room. BLE therex including SLR 2x10, ankle pumps 2x20, quad sets 2x10, knee-to-chest within available range 2x10, adduction squeezes 1x20, and abduction slides 2x10. BUE therex w/ 3# dowel, shoulder press 3x10, shoulder flexion 2x10, and bicep curls 3x10. Frequent but brief rest breaks 2/2 fatigue. Ended session in supine, all needs in reach.   Therapy Documentation Precautions:  Precautions Precautions: Fall Precaution Comments: B wound vacs Restrictions Weight Bearing Restrictions: Yes RLE Weight Bearing: Weight bearing as tolerated LLE Weight Bearing: Weight bearing as tolerated  Therapy/Group: Individual Therapy  Kenecia Barren K Marrion Accomando 07/21/2019, 3:07 PM

## 2019-07-21 NOTE — Progress Notes (Signed)
[place] PHYSICAL MEDICINE & REHABILITATION PROGRESS NOTE  Subjective/Complaints: Patient seen sitting up in bed this morning.  She states she slept well overnight.  She states she is ready begin therapies today.  ROS: Denies CP, shortness of breath, nausea, vomiting, diarrhea.  Objective: Vital Signs: Blood pressure 129/71, pulse 70, temperature 98 F (36.7 C), temperature source Oral, resp. rate 16, height 5\' 7"  (1.702 m), weight 120.2 kg, last menstrual period 11/21/2011, SpO2 98 %. No results found. Recent Labs    07/21/19 0615  WBC 9.3  HGB 10.2*  HCT 33.5*  PLT 324   Recent Labs    07/21/19 0615  NA 136  K 3.5  CL 95*  CO2 32  GLUCOSE 115*  BUN 22*  CREATININE 1.17*  CALCIUM 9.0    Physical Exam: BP 129/71 (BP Location: Right Arm)   Pulse 70   Temp 98 F (36.7 C) (Oral)   Resp 16   Ht 5\' 7"  (1.702 m)   Wt 120.2 kg   LMP 11/21/2011   SpO2 98%   BMI 41.50 kg/m  Constitutional: No distress . Vital signs reviewed.  Morbidly obese. HENT: Normocephalic.  Atraumatic. Eyes: EOMI. No discharge. Cardiovascular: No JVD. Respiratory: Normal effort. GI: Non-distended. Musc: Bilateral lower extremities with tenderness and edema Neurological: Alert and oriented Motor: Grossly 4+/5 bilateral upper extremities Bilateral lower extremities: Hip flexion 3/5, distally 4+/5  Skin: Bilateral lower extremities with dressing and VACs Psychiatric: She has a normal mood and affect. Herbehavior is normal.Judgmentand thought contentnormal.   Assessment/Plan: 1. Functional deficits secondary to debility which require 3+ hours per day of interdisciplinary therapy in a comprehensive inpatient rehab setting.  Physiatrist is providing close team supervision and 24 hour management of active medical problems listed below.  Physiatrist and rehab team continue to assess barriers to discharge/monitor patient progress toward functional and medical goals  Care Tool:  Bathing             Bathing assist       Upper Body Dressing/Undressing Upper body dressing        Upper body assist      Lower Body Dressing/Undressing Lower body dressing            Lower body assist       Toileting Toileting    Toileting assist       Transfers Chair/bed transfer  Transfers assist           Locomotion Ambulation   Ambulation assist              Walk 10 feet activity   Assist           Walk 50 feet activity   Assist           Walk 150 feet activity   Assist           Walk 10 feet on uneven surface  activity   Assist           Wheelchair     Assist               Wheelchair 50 feet with 2 turns activity    Assist            Wheelchair 150 feet activity     Assist            Medical Problem List and Plan: 1.Functional deficitssecondary toBLE u;cers s/p skin grafts with debility  Begin CIR evaluations  Notes reviewed- nonhealing ulcer status post I&D.  2. Antithrombotics: -DVT/anticoagulation:Pharmaceutical:Lovenox -antiplatelet therapy: low dose ASA 3. Pain Management:tylenol or oxycodone prn. 4. Mood:LCSW to follow for evaluation and support. -antipsychotic agents: N/A 5. Neuropsych: This patientiscapable of making decisions onherown behalf. 6. Skin/Wound Care:Continue wound VACto bilateral LE's per surgery recs. 7. Fluids/Electrolytes/Nutrition:Monitor I/Os.  Added supplements to promote healing.  8. CKD: Avoid Nephrotoxic medications. SCr 1.3 at baseline?   Creatinine 1.17 on 7/28  Continue to monitor 9. NISCM/s/p ICD: On lasix and coreg. Monitor for hypotension.  10. Anemia of chronic disease:   Hemoglobin 10.2 on 7/28  Continue to monitor 11. Leucocytosis: Resolved  WBCs 9.3 on 7/28  Monitor for signs of fever or infection.  12. Morbid obesity: Encouraged weight loss to help promote overall health and mobility.  Will consult dietitian to educate patient on appropriate diet.  LOS: 1 days A FACE TO FACE EVALUATION WAS PERFORMED  Ankit Lorie Phenix 07/21/2019, 10:08 AM

## 2019-07-21 NOTE — Evaluation (Signed)
Occupational Therapy Assessment and Plan  Patient Details  Name: Kari [name] MRN: [id number] Date of Birth: 1961-03-28  OT Diagnosis: acute pain and muscle weakness (generalized) Rehab Potential: Rehab Potential (ACUTE ONLY): Good ELOS: 7-10 days   Today's Date: 07/21/2019 OT Individual Time: 8921-1941 OT Individual Time Calculation (min): 70 min     Problem List:  Patient Active Problem List   Diagnosis Date Noted  . Physical debility 07/20/2019  . Idiopathic chronic venous hypertension of lower extremity with ulcer, bilateral ([place]) 07/15/2019  . H/O skin graft 05/13/2019  . CKD (chronic kidney disease), stage III ([place]) 01/13/2019  . Infected wound 12/26/2018  . Idiopathic chronic venous hypertension of both lower extremities with ulcer and inflammation ([place])   . History of fall 05/10/2018  . Hyperkalemia 02/21/2018  . Hypotension 02/21/2018  . Sepsis ([place]) 02/21/2018  . Encounter for central line placement   . Mitral regurgitation 10/30/2017  . Acute kidney injury ([place]) 07/21/2017  . Generalized weakness 07/20/2017  . Shock circulatory ([place])   . Acute respiratory failure with hypoxia ([place])   . Pulmonary edema   . Cardiac arrest ([place]) 06/26/2017  . Ventricular fibrillation ([place])   . Acute on chronic diastolic heart failure ([place])   . Abnormal ECG   . Cellulitis 04/02/2017  . AKI (acute kidney injury) ([place]) 12/17/2016  . Cellulitis and abscess of right lower extremity 12/17/2016  . Syncope and collapse 12/17/2016  . Peripheral edema 12/17/2016  . Hypokalemia 12/17/2016  . Anemia of chronic disease 12/17/2016  . Acute diastolic CHF (congestive heart failure) ([place]) 03/14/2014  . CHF (congestive heart failure) ([place]) 03/12/2014  . Sarcoidosis ([place]) 03/12/2014  . Severe sepsis ([place]) 10/17/2013  . ARF (acute renal failure) ([place]) 10/08/2013  . Cellulitis of multiple sites of lower extremity 10/08/2013  . Morbid obesity ([place]) 10/08/2013  . Volume depletion 10/08/2013   . Venous (peripheral) insufficiency 10/28/2012  . Mediastinal lymphadenopathy 10/16/2012  . Pulmonary nodules 10/16/2012  . 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 Chest pain 09/02/2012  . Asthma   . Hypertension   . Depression   . Venous stasis ulcers ([place]) 07/29/2012  . Varicose veins of lower extremities with ulcer ([place]) 07/08/2012  . Venous ulcer of leg ([place]) 07/08/2012    Past Medical History:  Past Medical History:  Diagnosis Date  . Arthritis   . Asthma   . CHF (congestive heart failure) ([place]) 03/12/2014   a. EF 40-45% by echo in 06/2017 with normal cors by cath. ICD placed following VT arrest  . Depression   . Headache(784.0)   . History of blood transfusion   . Hyperlipidemia   . Hypertension   . Lung nodules   . Myocardial infarction ([place]) 06/26/2014  . Renal disorder    followed by [facility]  . Sarcoidosis   . Ulcer    recurring, from chronic venous insufficiency   Past Surgical History:  Past Surgical History:  Procedure Laterality Date  . cataract surgery Left 07-2013  . I&D EXTREMITY Bilateral 12/31/2018   Procedure: DEBRIDEMENT BILATERAL LEGS, APPLY VAC X 2;  Surgeon: [name], MD;  Location: [facility];  Service: Orthopedics;  Laterality: Bilateral;  . I&D EXTREMITY Bilateral 01/02/2019   Procedure: REPEAT DEBRIDEMENT BILATERAL LEGS, APPLY VAC X 2;  Surgeon: [name], MD;  Location: [facility];  Service: Orthopedics;  Laterality: Bilateral;  . I&D EXTREMITY Bilateral 07/15/2019   Procedure: IRRIGATION AND DEBRIDEMENT VENOUS STASIS INSUFFICIENCY ULCERATIONS BILATERAL  LOWER EXTREMITIES;  Surgeon: [name], MD;  Location: [facility];  Service: Orthopedics;  Laterality: Bilateral;  . ICD IMPLANT N/A 07/05/2017   Procedure: ICD Implant;  Surgeon: Constance Haw, MD;  Location: [facility];  Service: Cardiovascular;  Laterality: N/A;  . LEFT HEART CATH AND CORONARY ANGIOGRAPHY N/A 07/03/2017    Procedure: Left Heart Cath and Coronary Angiography;  Surgeon: Belva Crome, MD;  Location: [facility] [place] [facility];  Service: Cardiovascular;  Laterality: N/A;  . SKIN SPLIT GRAFT Bilateral 01/07/2019   Procedure: SPLIT THICKNESS SKIN GRAFT BILATERAL LEGS, APPLY VAC;  Surgeon: [name], MD;  Location: [facility];  Service: Orthopedics;  Laterality: Bilateral;  . SKIN SPLIT GRAFT Bilateral 07/17/2019   Procedure: REPEAT IRRIGATION AND DEBRIDEMENT BILATERAL LOWER EXTREMITIES, SKIN GRAFT;  Surgeon: [name], MD;  Location: [facility];  Service: Orthopedics;  Laterality: Bilateral;    Assessment & Plan Clinical Impression: Patient is a 58 y.o. year old female with history of HTN, ICM/VF s/p ICD, CKD, moribd obesity, venous statis ulcers s/p I &D with poor healing and was admitted on 07/15/19 for repeat I &D with placement of prevena VAC by Dr. Sharol Given. She was taken back to OR repeat I and D with STSG and has been maintained on IV antibiotics thorough today. Wound VAC to remain.  Patient transferred to CIR on 07/20/2019 .    Patient currently requires min-mod assist with basic self-care skills secondary to muscle weakness and pain, decreased cardiorespiratoy endurance and decreased standing balance and decreased postural control.  Prior to hospitalization, patient could complete ADLs with independent .  Patient will benefit from skilled intervention to increase independence with basic self-care skills prior to discharge home with care partner.  Anticipate patient will require intermittent supervision and follow up home health.  OT - End of Session Activity Tolerance: Tolerates 30+ min activity with multiple rests Endurance Deficit: Yes Endurance Deficit Description: required frequent rest breaks OT Assessment Rehab Potential (ACUTE ONLY): Good OT Barriers to Discharge: Home environment access/layout OT Barriers to Discharge Comments: bedroom upstairs OT Patient demonstrates impairments in the  following area(s): Balance;Endurance;Motor;Pain;Safety OT Basic ADL's Functional Problem(s): Grooming;Bathing;Dressing;Toileting OT Advanced ADL's Functional Problem(s): Light Housekeeping OT Transfers Functional Problem(s): Toilet;Tub/Shower OT Additional Impairment(s): None OT Plan OT Intensity: Minimum of 1-2 x/day, 45 to 90 minutes OT Frequency: 5 out of 7 days OT Duration/Estimated Length of Stay: 7-10 days OT Treatment/Interventions: Balance/vestibular training;Discharge planning;Disease Lawyer;Functional mobility training;Pain management;Patient/family education;Psychosocial support;Self Care/advanced ADL retraining;Skin care/wound managment;Therapeutic Activities;Therapeutic Exercise OT Basic Self-Care Anticipated Outcome(s): Mod I OT Toileting Anticipated Outcome(s): Mod I OT Bathroom Transfers Anticipated Outcome(s): Mod I OT Recommendation Patient destination: Home Follow Up Recommendations: Home health OT Equipment Recommended: 3 in 1 bedside comode;Tub/shower bench   Skilled Therapeutic Intervention OT eval completed with discussion of rehab process, OT purpose, POC, ELOS, and goals.  ADL assessment completed with bathing and dressing at sit > stand level from EOB.  Pt required min guard for sit > stand and assistance for donning socks due to body habitus.  Completed short distance ambulation with RW bed > BSC with min guard.  Pt able to complete sit <> stand from [facility] with min guard.  Pt reports pain and fatigue, returned to bed and transferred back in to bed with min assist to adjust BLE.  Pt motivated to return to Mod I.  OT Evaluation Precautions/Restrictions  Precautions Precautions: Fall Precaution Comments: B wound vacs Restrictions Weight Bearing Restrictions: Yes  RLE Weight Bearing: Weight bearing as tolerated LLE Weight Bearing: Weight bearing as tolerated Pain Pain Assessment Pain Scale: 0-10 Pain Score: 8   Pain Type: Acute pain;Surgical pain Pain Location: Leg Pain Orientation: Right;Left Pain Descriptors / Indicators: Constant;Sharp Pain Frequency: Intermittent Pain Onset: On-going Pain Intervention(s): Rest;Repositioned;Distraction Home Living/Prior Functioning Home Living Family/patient expects to be discharged to:: Private residence Living Arrangements: Other relatives Available Help at Discharge: Family Type of Home: House Home Access: Level entry Home Layout: Two level Alternate Level Stairs-Number of Steps: pt states about 8 steps Alternate Level Stairs-Rails: Left Bathroom Shower/Tub: Chiropodist: Standard Bathroom Accessibility: Yes Additional Comments: lives with younger sister and sister is there at all times. Pt has a daughter with an 7 yr grandchild that can provided PRN  Lives With: Family(sister) IADL History Homemaking Responsibilities: Yes Meal Prep Responsibility: No Laundry Responsibility: No Cleaning Responsibility: Secondary Bill Paying/Finance Responsibility: Secondary Shopping Responsibility: Secondary Current License: No Prior Function Level of Independence: Independent with basic ADLs, Requires assistive device for independence, Needs assistance with homemaking  Able to Take Stairs?: Yes Driving: No Vocation: On disability Leisure: Hobbies-yes (Comment) Comments: does crossword puzzles and spends time with her grandson ADL ADL Upper Body Bathing: Setup Where Assessed-Upper Body Bathing: Edge of bed Lower Body Bathing: Moderate assistance Where Assessed-Lower Body Bathing: Edge of bed Upper Body Dressing: Contact guard Where Assessed-Upper Body Dressing: Edge of bed Lower Body Dressing: Moderate assistance Where Assessed-Lower Body Dressing: Edge of bed Toilet Transfer: Minimal assistance Toilet Transfer Method: Counselling psychologist: Extra wide bedside commode Vision Baseline Vision/History: Wears  glasses Wears Glasses: Reading only Patient Visual Report: No change from baseline Vision Assessment?: No apparent visual deficits Perception  Perception: Within Functional Limits Praxis Praxis: Intact Cognition Overall Cognitive Status: Within Functional Limits for tasks assessed Arousal/Alertness: Awake/alert Orientation Level: Person;Place;Situation Person: Oriented Place: Oriented Situation: Oriented Year: 2020 Month: July Day of Week: Correct Memory: Appears intact Immediate Memory Recall: Sock;Blue;Bed Memory Recall Sock: Without Cue Memory Recall Blue: Without Cue Memory Recall Bed: With Cue Attention: Alternating Awareness: Appears intact Problem Solving: Appears intact Safety/Judgment: Appears intact Sensation Sensation Light Touch: Appears Intact Coordination Gross Motor Movements are Fluid and Coordinated: Yes Fine Motor Movements are Fluid and Coordinated: Yes Mobility  Bed Mobility Bed Mobility: Supine to Sit;Rolling Right;Sit to Supine Rolling Right: Contact Guard/Touching assist Supine to Sit: Contact Guard/Touching assist Sit to Supine: Minimal Assistance - Patient > 75% Transfers Sit to Stand: Contact Guard/Touching assist  Extremity/Trunk Assessment RUE Assessment RUE Assessment: Within Functional Limits LUE Assessment LUE Assessment: Within Functional Limits     Refer to Care Plan for Long Term Goals  Recommendations for other services: None    Discharge Criteria: Patient will be discharged from OT if patient refuses treatment 3 consecutive times without medical reason, if treatment goals not met, if there is a change in medical status, if patient makes no progress towards goals or if patient is discharged from hospital.  The above assessment, treatment plan, treatment alternatives and goals were discussed and mutually agreed upon: by patient  Shalimar Mcclain, [facility] 07/21/2019, 11:02 AM

## 2019-07-21 NOTE — Progress Notes (Signed)
Nutrition Brief Note  RD consulted for diet education for wt loss. 58 year old female admitted to CIR 7/27 s/p [facility] for bilateral LE chronic venous stasis ulcers. Patient had skin graft in January and undergone serial compression wraps with persistent ulceration. Pt [facility] to [facility] on 7/22 for repeat I&D, application of skin grafts, and wound vacs. Patient with past medical history significant of morbid obesity, HTN, sarcoidosis, CHF (EF 40-45%). Estimated LOS 10-14 days.  Patient inappropriate for education at this time. Per chart review, pt lives with younger sister. Sister prepares all meals for patient and will be providing continued care for patient when she returns home. Patient may benefit from receiving education at a time that is closer to discharge in order to better share with caregivers.   Considering patient with nonhealing ulcer status to BLE s/p I&D with wound vacs,; addressing patients need for adequate protein and calories to promote healing is of concern. RD to order supplements to assist with increased needs of patient.   -1 packet Juven BID, each packet provides 95 calories, 2.5 grams of protein (collagen), and 9.8 grams of carbohydrate (3 grams sugar); also contains 7 grams of L-arginine and L-glutamine, 300 mg vitamin C, 15 mg vitamin E, 1.2 mcg vitamin B-12, 9.5 mg zinc, 200 mg calcium, and 1.5 g  Calcium Beta-hydroxy-Beta-methylbutyrate to support wound healing  -Ensure Enlive po BID, each supplement provides 350 kcal and 20 grams of protein   Wt Readings from Last 15 Encounters:  07/20/19 120.2 kg  07/15/19 120.2 kg  07/08/19 135.6 kg  06/30/19 135.6 kg  06/23/19 135.6 kg  06/18/19 135.6 kg  06/10/19 135.6 kg  06/04/19 135.6 kg  05/05/19 135.6 kg  04/29/19 135.6 kg  04/22/19 132.6 kg  04/14/19 121.6 kg  03/31/19 121.6 kg  03/24/19 121.6 kg  03/17/19 121.6 kg    Body mass index is 41.5 kg/m. Patient meets criteria for morbid based on current BMI.   Current  diet order is HH, patient is consuming approximately 75% of meals at this time. Labs and medications reviewed.   No nutrition interventions warranted at this time. If nutrition issues arise, please re-consult RD.   Lajuan Lines, RD, LDN  After Hours/Weekend Pager: [phone]

## 2019-07-21 NOTE — Progress Notes (Signed)
Inpatient Rehabilitation  Patient information reviewed and entered into eRehab system by Tyreka Henneke M. Alphonsine Minium, M.A., CCC/SLP, PPS Coordinator.  Information including medical coding, functional ability and quality indicators will be reviewed and updated through discharge.    

## 2019-07-22 ENCOUNTER — Inpatient Hospital Stay (HOSPITAL_COMMUNITY): Payer: Medicare Other | Admitting: Occupational Therapy

## 2019-07-22 ENCOUNTER — Inpatient Hospital Stay (HOSPITAL_COMMUNITY): Payer: Medicare Other | Admitting: Physical Therapy

## 2019-07-22 DIAGNOSIS — I428 Other cardiomyopathies: Secondary | ICD-10-CM

## 2019-07-22 DIAGNOSIS — I952 Hypotension due to drugs: Secondary | ICD-10-CM

## 2019-07-22 MED ORDER — ACETAMINOPHEN 325 MG PO TABS: 325.0000 mg | ORAL_TABLET | ORAL | Status: AC | PRN

## 2019-07-22 NOTE — Consult Note (Signed)
WOC consulted for NPWT dressing occulusion. Patient has new skin grafts placed 07/17/19 with orders for NPWT dressings to stay in place for a week.  Upon transfer from the inpatient unit the dressings (which are Prevena dressings) were hooked to the portable pumps. Now the left (per nursing is alarming occluded). Suggested to hook the dressing to a hospital unit. She will attempt this. I have provided her with contact number for [employer] representative to contact if question related to the [facility] unit or connection issues.   [place], [place], [place]

## 2019-07-22 NOTE — Progress Notes (Signed)
Physical Therapy Session Note  Patient Details  Name: Kari [name] MRN: [id number] Date of Birth: Jun 11, 1961  Today's Date: 07/22/2019 PT Individual Time: 1305-1400 PT Individual Time Calculation (min): 55 min   Short Term Goals: Week 1:  PT Short Term Goal 1 (Week 1): STG=LTG due to short ELOS.  Skilled Therapeutic Interventions/Progress Updates:   Pt received sitting in recliner and agreeable to PT. Pt requesting pain medicine for Bil LE pain 7.10, RN notified. PT instructed pt in dynamic balance training to perform lateral reaches with ben bag toss to target x 12 BUE. P{t then picked up bean bags off floor with Reacher, UE support on RW and supervision assist from PT. ont near LOB due to pain in the R LE, but able to correct without additional assist PT.  Gait training with RW x 175f with supervision assist from PT for safety. Min cues for increased step heigh to prevent foot drag Bil.  5x STS 28 sec. Supervision assist with min cues for anterior weight shift from PT. Patient returned to room and left sitting in recliner with call bell in reach and all needs met.          Therapy Documentation Precautions:  Precautions Precautions: Fall Precaution Comments: B wound vacs Restrictions Weight Bearing Restrictions: Yes RLE Weight Bearing: Weight bearing as tolerated LLE Weight Bearing: Weight bearing as tolerated Pain: 7/10, bil LE, surgical, see MAR.    Therapy/Group: Individual Therapy  ALorie Phenix7/29/2020, 2:06 PM

## 2019-07-22 NOTE — Progress Notes (Signed)
Social Work Patient ID: Kari [name], female   DOB: 04/18/1961, 58 y.o.   MRN: [id number]  Met with pt to discuss team conference goals mod/i level and target discharge date 8/1. Pt will be ready to go home then. She does want a rolling walker and bsc. She voiced her sister will help her like she did prior to admission. Work toward discharge Sat.

## 2019-07-22 NOTE — Progress Notes (Signed)
Occupational Therapy Session Note  Patient Details  Name: Kari [name] MRN: [id number] Date of Birth: 1961/07/14  Today's Date: 07/22/2019 OT Individual Time: 0815-0900 OT Individual Time Calculation (min): 45 min    Short Term Goals: Week 1:  OT Short Term Goal 1 (Week 1): STG = LTGs due to ELOS  Skilled Therapeutic Interventions/Progress Updates:    1:1 Pt in bed when arrived. Pt able to transition to EOB with extra time with supervision. Pt participated in bathing and dressing sit to stand at EOB. Pt able to perform this with supervision. Pt does require extra time to sit to stands due to LEs being sore. Pt does wear dresses with slippers. Pt still with bilateral portable wound VACs and una boots. Pt able to manage her wound VACs. Pt performs short distance ambulation (5 feet) into the recliner to rest and finish breakfast.   2nd session 1045-11:00 (58min) 1:1 Therapeutic activity with focus on activity tolerance, sit to stands, functional ambulation and overall strengthening exercises. Pt able to ambulate from her room to the dayroom ~200 feet with RW with supervision  (to not trip on VAC cords). After seated rest breaks; transitioned into supine/ sidelying and performed LE exercises including straight LE raises, clam shells, and whole LE  Abduction. Pt performed toe tapping alternating LEs on a block  With UE support. Transition to using rebounder with non weighted ball in standing for 1 min and then transitioned into sit to stand with each bounce.  Ambulated back to room and sat in the recliner in prep for lunch.   Therapy Documentation Precautions:  Precautions Precautions: Fall Precaution Comments: B wound vacs Restrictions Weight Bearing Restrictions: Yes RLE Weight Bearing: Weight bearing as tolerated LLE Weight Bearing: Weight bearing as tolerated Pain:  soreness in lower legs in both sessions but able to continue   Therapy/Group: Individual Therapy  Willeen Cass [facility] 07/22/2019, 1:16 PM

## 2019-07-22 NOTE — Patient Care Conference (Signed)
Inpatient RehabilitationTeam Conference and Plan of Care Update Date: 07/22/2019   Time: 2:30 PM    Patient Name: Kari [name]      Medical Record Number: [id number]  Date of Birth: 1961/07/09 Sex: Female         Room/Bed: 4M11C/4M11C-01 Payor Info: Payor: MEDICARE / Plan: MEDICARE PART A AND B / Product Type: *No Product type* /    Admitting Diagnosis: 5. Gen Team  Debility, malaise  Admit Date/Time:  07/20/2019  1:59 PM Admission Comments: No comment available   Primary Diagnosis:  <principal problem not specified> Principal Problem: <principal problem not specified>  Patient Active Problem List   Diagnosis Date Noted  . Nonischemic cardiomyopathy ([place])   . Physical debility 07/20/2019  . Idiopathic chronic venous hypertension of lower extremity with ulcer, bilateral ([place]) 07/15/2019  . H/O skin graft 05/13/2019  . CKD (chronic kidney disease), stage III ([place]) 01/13/2019  . Infected wound 12/26/2018  . Idiopathic chronic venous hypertension of both lower extremities with ulcer and inflammation ([place])   . History of fall 05/10/2018  . Hyperkalemia 02/21/2018  . Hypotension 02/21/2018  . Sepsis ([place]) 02/21/2018  . Encounter for central line placement   . Mitral regurgitation 10/30/2017  . Acute kidney injury ([place]) 07/21/2017  . Generalized weakness 07/20/2017  . Shock circulatory ([place])   . Acute respiratory failure with hypoxia ([place])   . Pulmonary edema   . Cardiac arrest ([place]) 06/26/2017  . Ventricular fibrillation ([place])   . Acute on chronic diastolic heart failure ([place])   . Abnormal ECG   . Cellulitis 04/02/2017  . AKI (acute kidney injury) ([place]) 12/17/2016  . Cellulitis and abscess of right lower extremity 12/17/2016  . Syncope and collapse 12/17/2016  . Peripheral edema 12/17/2016  . Hypokalemia 12/17/2016  . Anemia of chronic disease 12/17/2016  . Acute diastolic CHF (congestive heart failure) ([place]) 03/14/2014  . CHF (congestive heart failure) ([place]) 03/12/2014   . Sarcoidosis ([place]) 03/12/2014  . Severe sepsis ([place]) 10/17/2013  . ARF (acute renal failure) ([place]) 10/08/2013  . Cellulitis of multiple sites of lower extremity 10/08/2013  . Morbid obesity ([place]) 10/08/2013  . Volume depletion 10/08/2013  . Venous (peripheral) insufficiency 10/28/2012  . Mediastinal lymphadenopathy 10/16/2012  . Pulmonary nodules 10/16/2012  . 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 Chest pain 09/02/2012  . Asthma   . Hypertension   . Depression   . Venous stasis ulcers ([place]) 07/29/2012  . Varicose veins of lower extremities with ulcer ([place]) 07/08/2012  . Venous ulcer of leg ([place]) 07/08/2012    Expected Discharge Date: Expected Discharge Date: 07/25/19  Team Members Present: Physician leading conference: Dr. Delice Lesch Social Worker Present: Ovidio Kin, LCSW Nurse Present: Starlyn Skeans, RN PT Present: Georjean Mode, PT OT Present: Willeen Cass, OT;Roanna Epley, COTA SLP Present: Charolett Bumpers, SLP PPS Coordinator present : Gunnar Fusi, SLP     Current Status/Progress Goal Weekly Team Focus  Medical   Functional deficits secondary to BLE u;cers s/p skin grafts with debility  Improve mobility, wounds, hypotension, cardiomyopathy, CKD  See above   Bowel/Bladder   continent of bowel & bladder, LBM 7/22, was given stool softeners  normalized bowel pattern  continue to monitor & begin bowel program   Swallow/Nutrition/ Hydration             ADL's   Min guard bathing and dressing, Mod A LB dressing due to body habitus, Min guard transfers with RW  Mod  I  activity tolerance, LB dressing, functional transfers, d/c planning   Mobility             Communication             Safety/Cognition/ Behavioral Observations            Pain   c/o pain to BLE, has oxycodone, tylenol & robaxin prn  pain scale <4/10  assess & treat as needed   Skin   BLE surgical wounds with surgical wound vacs in place  which compression wraps, edema  no signs of infection to surgical wounds  assess q shift      *See Care Plan and progress notes for long and short-term goals.     Barriers to Discharge  Current Status/Progress Possible Resolutions Date Resolved   Physician    Medical stability;Weight;Wound Care     See above  Therapies, d/c VACs end fo the week, follow labs      Nursing                  PT  Home environment access/layout;Wound Care  B wound vacs on LE wounds; 8 steps with L rail to get to second floor apartment              OT Home environment access/layout  bedroom upstairs             SLP                SW                Discharge Planning/Teaching Needs:  HOme with sister who was assisting with her care prior to admission. Pt has been through this before and knows what to expect      Team Discussion:  Goals mod/i level, currently CGA-min assist level. Working on activity tolerance and ambulation. Pain is controlled. MD watching anemia and BP which is running low. WOC-RN seeing B-LE surgical wounds.   Revisions to Treatment Plan:  DC 8/1    Continued Need for Acute Rehabilitation Level of Care: The patient requires daily medical management by a physician with specialized training in physical medicine and rehabilitation for the following conditions: Daily direction of a multidisciplinary physical rehabilitation program to ensure safe treatment while eliciting the highest outcome that is of practical value to the patient.: Yes Daily medical management of patient stability for increased activity during participation in an intensive rehabilitation regime.: Yes Daily analysis of laboratory values and/or radiology reports with any subsequent need for medication adjustment of medical intervention for : Post surgical problems;Wound care problems;Renal problems   I attest that I was present, lead the team conference, and concur with the assessment and plan of the team. Teleconference  held due to COVID 19   Haifa Hatton, Gardiner Rhyme 07/22/2019, 3:45 PM

## 2019-07-22 NOTE — Progress Notes (Addendum)
[facility] PHYSICAL MEDICINE & REHABILITATION PROGRESS NOTE  Subjective/Complaints: Patient seen sitting up in bed this morning.  She states she slept very well last night.  She states she had good first day of therapies yesterday.  ROS: Denies CP, shortness of breath, nausea, vomiting, diarrhea.  Objective: Vital Signs: Blood pressure 92/60, pulse 73, temperature 97.8 F (36.6 C), temperature source Oral, resp. rate 16, height 5\' 7"  (1.702 m), weight 120.2 kg, last menstrual period 11/21/2011, SpO2 98 %. No results found. Recent Labs    07/21/19 0615  WBC 9.3  HGB 10.2*  HCT 33.5*  PLT 324   Recent Labs    07/21/19 0615  NA 136  K 3.5  CL 95*  CO2 32  GLUCOSE 115*  BUN 22*  CREATININE 1.17*  CALCIUM 9.0    Physical Exam: BP 92/60 (BP Location: Right Arm)   Pulse 73   Temp 97.8 F (36.6 C) (Oral)   Resp 16   Ht 5\' 7"  (1.702 m)   Wt 120.2 kg   LMP 11/21/2011   SpO2 98%   BMI 41.50 kg/m  Constitutional: NAD.  Vital signs reviewed.  Morbidly obese. HENT: Normocephalic.  Atraumatic. Eyes: EOMI.  No discharge. Cardiovascular: No JVD. Respiratory: Normal effort. GI: Non-distended. Musc: Bilateral lower extremities with tenderness and edema Neurological: Alert and oriented Motor: Grossly 4+/5 bilateral upper extremities Bilateral lower extremities: Hip flexion 4-/5, distally 4+/5, ankle limited by dressing Sensation intact light touch Skin: Bilateral lower extremities with dressing and VACs Psychiatric: She has a normal mood and affect. Herbehavior is normal.Judgmentand thought contentnormal.   Assessment/Plan: 1. Functional deficits secondary to debility which require 3+ hours per day of interdisciplinary therapy in a comprehensive inpatient rehab setting.  Physiatrist is providing close team supervision and 24 hour management of active medical problems listed below.  Physiatrist and rehab team continue to assess barriers to discharge/monitor patient  progress toward functional and medical goals  Care Tool:  Bathing    Body parts bathed by patient: Right arm, Left arm, Chest, Abdomen, Face   Body parts bathed by helper: Left lower leg, Right lower leg Body parts n/a: Right upper leg, Left upper leg(B wrapping)   Bathing assist Assist Level: Minimal Assistance - Patient > 75%     Upper Body Dressing/Undressing Upper body dressing   What is the patient wearing?: Dress    Upper body assist Assist Level: Contact Guard/Touching assist    Lower Body Dressing/Undressing Lower body dressing            Lower body assist       Toileting Toileting    Toileting assist Assist for toileting: Supervision/Verbal cueing     Transfers Chair/bed transfer  Transfers assist     Chair/bed transfer assist level: Supervision/Verbal cueing     Locomotion Ambulation   Ambulation assist      Assist level: Contact Guard/Touching assist Assistive device: Walker-rolling Max distance: 160'   Walk 10 feet activity   Assist     Assist level: Contact Guard/Touching assist Assistive device: Walker-rolling   Walk 50 feet activity   Assist    Assist level: Contact Guard/Touching assist Assistive device: Walker-rolling    Walk 150 feet activity   Assist    Assist level: Contact Guard/Touching assist Assistive device: Walker-rolling    Walk 10 feet on uneven surface  activity   Assist Walk 10 feet on uneven surfaces activity did not occur: Safety/medical concerns(decresed strength/balance)  Wheelchair     Assist Will patient use wheelchair at discharge?: No Type of Wheelchair: Manual           Wheelchair 50 feet with 2 turns activity    Assist        Assist Level: Set up assist, Minimal Assistance - Patient > 75%   Wheelchair 150 feet activity     Assist Wheelchair 150 feet activity did not occur: Safety/medical concerns(decresed strength/endurance)          Medical  Problem List and Plan: 1.Functional deficitssecondary toBLE ulcers s/p skin grafts with debility  Continue CIR  Team conference today to discuss current and goals and coordination of care, home and environmental barriers, and discharge planning with nursing, case manager, and therapies.  2. Antithrombotics: -DVT/anticoagulation:Pharmaceutical:Lovenox -antiplatelet therapy: low dose ASA 3. Pain Management:tylenol or oxycodone prn. 4. Mood:LCSW to follow for evaluation and support. -antipsychotic agents: N/A 5. Neuropsych: This patientiscapable of making decisions onherown behalf. 6. Skin/Wound Care:Continue wound VACto bilateral LE's per surgery recs. 7. Fluids/Electrolytes/Nutrition:Monitor I/Os.  Added supplements to promote healing.  8. CKD: Avoid Nephrotoxic medications. SCr 1.3 at baseline?   Creatinine 1.17 on 7/28, will repeat labs later this week  Continue to monitor 9. NISCM/s/p ICD: On lasix and coreg. Monitor for hypotension.  Filed Weights   07/20/19 1440  Weight: 120.2 kg   Daily weights ordered 10. Anemia of chronic disease:   Hemoglobin 10.2 on 7/20, labs ordered for tomorrow  Continue to monitor 11. Leucocytosis: Resolved  WBCs 9.3 on 7/28  Monitor for signs of fever or infection.  12. Morbid obesity: Encouraged weight loss to help promote overall health and mobility. Will consult dietitian to educate patient on appropriate diet. 13.  Hypotension  Low, but asymptomatic on 7/29  Will consider decreasing Lasix if persistent/symptomatic  LOS: 2 days A FACE TO FACE EVALUATION WAS PERFORMED  Traves Majchrzak Lorie Phenix 07/22/2019, 11:17 AM

## 2019-07-23 ENCOUNTER — Inpatient Hospital Stay (HOSPITAL_COMMUNITY): Payer: Medicare Other | Admitting: Occupational Therapy

## 2019-07-23 ENCOUNTER — Inpatient Hospital Stay (HOSPITAL_COMMUNITY): Payer: Medicare Other

## 2019-07-23 DIAGNOSIS — R0989 Other specified symptoms and signs involving the circulatory and respiratory systems: Secondary | ICD-10-CM

## 2019-07-23 LAB — CBC WITH DIFFERENTIAL/PLATELET
Abs Immature Granulocytes: 0.04 10*3/uL (ref 0.00–0.07)
Basophils Absolute: 0.1 10*3/uL (ref 0.0–0.1)
Basophils Relative: 1 %
Eosinophils Absolute: 0.3 10*3/uL (ref 0.0–0.5)
Eosinophils Relative: 3 %
HCT: 34.5 % — ABNORMAL LOW (ref 36.0–46.0)
Hemoglobin: 10.8 g/dL — ABNORMAL LOW (ref 12.0–15.0)
Immature Granulocytes: 0 %
Lymphocytes Relative: 17 %
Lymphs Abs: 1.8 10*3/uL (ref 0.7–4.0)
MCH: 28.5 pg (ref 26.0–34.0)
MCHC: 31.3 g/dL (ref 30.0–36.0)
MCV: 91 fL (ref 80.0–100.0)
Monocytes Absolute: 1 10*3/uL (ref 0.1–1.0)
Monocytes Relative: 9 %
Neutro Abs: 7.3 10*3/uL (ref 1.7–7.7)
Neutrophils Relative %: 70 %
Platelets: 339 10*3/uL (ref 150–400)
RBC: 3.79 MIL/uL — ABNORMAL LOW (ref 3.87–5.11)
RDW: 15.5 % (ref 11.5–15.5)
WBC: 10.5 10*3/uL (ref 4.0–10.5)
nRBC: 0 % (ref 0.0–0.2)

## 2019-07-23 MED ORDER — ENOXAPARIN SODIUM 80 MG/0.8ML ~~LOC~~ SOLN
65.0000 mg | SUBCUTANEOUS | Status: DC
  Administered 2019-07-23 – 2019-07-24 (×2): 65 mg via SUBCUTANEOUS
  Filled 2019-07-23 (×3): qty 0.65

## 2019-07-23 MED ORDER — OXYCODONE HCL 5 MG PO TABS
5.0000 mg | ORAL_TABLET | ORAL | Status: DC | PRN
  Administered 2019-07-23 – 2019-07-24 (×6): 10 mg via ORAL
  Administered 2019-07-24: 5 mg via ORAL
  Administered 2019-07-25: 10 mg via ORAL
  Filled 2019-07-23 (×4): qty 2
  Filled 2019-07-23: qty 1
  Filled 2019-07-23 (×3): qty 2

## 2019-07-23 NOTE — Progress Notes (Signed)
Occupational Therapy Note  Patient Details  Name: JAYAH BALTHAZAR MRN: [id number] Date of Birth: 1961/11/02  Today's Date: 07/23/2019 OT Individual Time: 1345-1355 OT Individual Time Calculation (min): 10 min  and Today's Date: 07/23/2019 OT Missed Time: 35 Minutes Missed Time Reason: Patient fatigue  Pt missed 35 mins scheduled OT treatment due to fatigue.  Pt asleep upon arrival, requiring increased time to fully arouse.  Pt able to open eyes and look at therapist but reporting extreme fatigue and requesting to rest.  Educated on upcoming d/c date and encouraged pt to engage in treatment session, however pt continuing to refuse due to fatigue and pain in BLE.  Pt remained supine in bed with all needs in reach.   Simonne Come 07/23/2019, 2:21 PM

## 2019-07-23 NOTE — Progress Notes (Signed)
Physical Therapy Session Note  Patient Details  Name: Kari [name] MRN: [id number] Date of Birth: 17-Jul-1961  Today's Date: 07/23/2019 PT Individual Time: 1045-1130 PT Individual Time Calculation (min): 45 min   Short Term Goals: Week 1:  PT Short Term Goal 1 (Week 1): STG=LTG due to short ELOS.  Skilled Therapeutic Interventions/Progress Updates:     Patient in recliner upon PT arrival. Patient alert and agreeable to PT session, however reported 7-8/10 B LE pain this morning and requested that she stay in the room and limit the amount of time spent on her feet. RN provided Tylenol during session and PT provided rest breaks, repositioning, and distraction as pain interventions throughout session.  Therapeutic Activity: Bed Mobility: Patient performed sit to supine with supervision in a flat bed without use of bed rails. Provided verbal cues for bringing LEs up and she lies down to utilize momentum when LEs feel tired.  Transfers: Patient performed a stand pivot transfer from the recliner to the bed x1 with close supervision and management of lines form wound vacs without an AD.   Therapeutic Exercise: Patient performed the following exercises with verbal and tactile cues for proper technique. Sitting: -B toe lifts 2x20 -B LAQs 2x10 -B seated marching 2x20 -B isometric hip adduction with towel roll between knees with 5 sec hold 2x10 -B isometric hip abduction with manual resistance at knees with 5 sec hold 2x10  Offered to continue with exercises in bed and patient declined due to pain. Missed 30 min of skilled PT due to pain. PT will attempt to make up missed minutes at a later time as able.   Patient in bed at end of session with breaks locked, bed alarm set, and all needs within reach. Educated on fall risk/prevention at home and LE inspection on prevention of infection throughout session.    Therapy Documentation Precautions:  Precautions Precautions: Fall Precaution  Comments: B wound vacs Restrictions Weight Bearing Restrictions: Yes RLE Weight Bearing: Weight bearing as tolerated LLE Weight Bearing: Weight bearing as tolerated   Therapy/Group: Individual Therapy  Dacota Devall L Peace Jost PT, DPT  07/23/2019, 12:52 PM

## 2019-07-23 NOTE — Progress Notes (Signed)
Per Dr. Jess Barters recommendations will d/c [facility] tomorrow --'to apply adaptic tot the skin graft and apply 4x4 plus kerlex and ace, and I'll need to see in the office next week'

## 2019-07-23 NOTE — Progress Notes (Signed)
Social Work Patient ID: Kari [name], female   DOB: [birth year], 59 y.o.   MRN: [id number]  Met with pt to discuss home health follow up she had wanted [facility] but they can not take the referral. She has decided upon [facility]. Have made referral to them. Equipment ordered and pt feels she will be ready for discharge Sat, once wound vac's discharged.

## 2019-07-23 NOTE — Progress Notes (Signed)
Occupational Therapy Session Note  Patient Details  Name: Kari [name] MRN: [id number] Date of Birth: [birth year]  Today's Date: 07/23/2019 OT Individual Time: 0815-0930 OT Individual Time Calculation (min): 75 min    Short Term Goals: Week 1:  OT Short Term Goal 1 (Week 1): STG = LTGs due to ELOS  Skilled Therapeutic Interventions/Progress Updates:    Treatment session with focus on self-care retraining and functional transfers. Pt received supine in bed with RN present to administer morning meds.  Pt completed bed mobility with supervision with use of bed rails.  Engaged in bathing and dressing at sit > stand level from EOB.  Pt choosing not to wear underwear at this time, therefore only donning dress and slippers with setup.  Pt may benefit from use of AE to assist with LB dressing, when she chooses as she typically wears dresses.  Completed stand pivot transfer with RW to w/c with supervision/setup.  Engaged in toilet and tub/shower transfers in ADL apt with RW and supervision for management of wound VAC and lines.  Pt reports VAC to be removed tomorrow.  Returned to room and left upright in recliner with all needs in reach.  Therapy Documentation Precautions:  Precautions Precautions: Fall Precaution Comments: B wound vacs Restrictions Weight Bearing Restrictions: Yes RLE Weight Bearing: Weight bearing as tolerated LLE Weight Bearing: Weight bearing as tolerated Pain:  Pt with c/o pain 7/10. Medicated at beginning of session.   Therapy/Group: Individual Therapy  Iyanna Drummer, [place] 07/23/2019, 11:04 AM

## 2019-07-23 NOTE — Progress Notes (Signed)
[facility] PHYSICAL MEDICINE & REHABILITATION PROGRESS NOTE  Subjective/Complaints: Patient seen laying in bed this AM.  She states she slept well overnight.  She is excited about her planned discharge for Saturday.   ROS: Denies CP, shortness of breath, nausea, vomiting, diarrhea.  Objective: Vital Signs: Blood pressure (!) 92/54, pulse 70, temperature (!) 97.5 F (36.4 C), temperature source Oral, resp. rate 17, height 5\' 7"  (1.702 m), weight 134.2 kg, last menstrual period 11/21/2011, SpO2 98 %. No results found. Recent Labs    07/21/19 0615 07/23/19 0549  WBC 9.3 10.5  HGB 10.2* 10.8*  HCT 33.5* 34.5*  PLT 324 339   Recent Labs    07/21/19 0615  NA 136  K 3.5  CL 95*  CO2 32  GLUCOSE 115*  BUN 22*  CREATININE 1.17*  CALCIUM 9.0    Physical Exam: BP (!) 92/54 (BP Location: Right Arm)   Pulse 70   Temp (!) 97.5 F (36.4 C) (Oral)   Resp 17   Ht 5\' 7"  (1.702 m)   Wt 134.2 kg   LMP 11/21/2011   SpO2 98%   BMI 46.34 kg/m  Constitutional: NAD. Vital signs reviewed.  Morbidly obese. HENT: Normocephalic. Atraumatic. Eyes: EOMI. No discharge. Cardiovascular: No JVD. Respiratory: Normal effort. GI: Non-distended. Musc: Bilateral lower extremities with tenderness and edema Neurological: Alert and oriented Motor: Grossly 4+/5 bilateral upper extremities Bilateral lower extremities: Hip flexion 4/5, distally 4+/5, ankle limited by dressing Sensation intact light touch Skin: Bilateral lower extremities with dressing and VACs Psychiatric: She has a normal mood and affect. Herbehavior is normal.Judgmentand thought contentnormal.   Assessment/Plan: 1. Functional deficits secondary to debility which require 3+ hours per day of interdisciplinary therapy in a comprehensive inpatient rehab setting.  Physiatrist is providing close team supervision and 24 hour management of active medical problems listed below.  Physiatrist and rehab team continue to assess barriers  to discharge/monitor patient progress toward functional and medical goals  Care Tool:  Bathing    Body parts bathed by patient: Right arm, Left arm, Chest, Abdomen, Face, Front perineal area, Buttocks, Right upper leg, Left upper leg   Body parts bathed by helper: Left lower leg, Right lower leg Body parts n/a: Left lower leg, Right lower leg   Bathing assist Assist Level: Set up assist     Upper Body Dressing/Undressing Upper body dressing   What is the patient wearing?: Dress    Upper body assist Assist Level: Set up assist    Lower Body Dressing/Undressing Lower body dressing    Lower body dressing activity did not occur: Refused(wears dresses and refused underware per OT doc)       Lower body assist       Toileting Toileting    Toileting assist Assist for toileting: Supervision/Verbal cueing     Transfers Chair/bed transfer  Transfers assist     Chair/bed transfer assist level: Supervision/Verbal cueing     Locomotion Ambulation   Ambulation assist      Assist level: Contact Guard/Touching assist Assistive device: Walker-rolling Max distance: 150   Walk 10 feet activity   Assist     Assist level: Contact Guard/Touching assist Assistive device: Walker-rolling   Walk 50 feet activity   Assist    Assist level: Contact Guard/Touching assist Assistive device: Walker-hemi    Walk 150 feet activity   Assist    Assist level: Contact Guard/Touching assist Assistive device: Walker-rolling    Walk 10 feet on uneven surface  activity  Assist Walk 10 feet on uneven surfaces activity did not occur: Safety/medical concerns(decresed strength/balance)         Wheelchair     Assist Will patient use wheelchair at discharge?: No Type of Wheelchair: Manual           Wheelchair 50 feet with 2 turns activity    Assist        Assist Level: Set up assist, Minimal Assistance - Patient > 75%   Wheelchair 150 feet  activity     Assist Wheelchair 150 feet activity did not occur: Safety/medical concerns(decresed strength/endurance)          Medical Problem List and Plan: 1.Functional deficitssecondary toBLE ulcers s/p skin grafts with debility  Continue CIR 2. Antithrombotics: -DVT/anticoagulation:Pharmaceutical:Lovenox -antiplatelet therapy: low dose ASA 3. Pain Management:tylenol or oxycodone prn. 4. Mood:LCSW to follow for evaluation and support. -antipsychotic agents: N/A 5. Neuropsych: This patientiscapable of making decisions onherown behalf. 6. Skin/Wound Care:Continue wound VACto bilateral LE's per surgery recs, plan to d/c tomorrow.  Plan to use adaptic dressing with kerlix and ace wraps and follow up next week. 7. Fluids/Electrolytes/Nutrition:Monitor I/Os.  Added supplements to promote healing.  8. CKD: Avoid Nephrotoxic medications. SCr 1.3 at baseline?   Creatinine 1.17 on 7/28, labs ordered for tomorrow  Continue to monitor 9. NISCM/s/p ICD: On lasix and coreg. Monitor for hypotension.  Filed Weights   07/20/19 1440 07/23/19 0500  Weight: 120.2 kg 134.2 kg   Daily weights ordered  ?Reliability 10. Anemia of chronic disease:   Hemoglobin 10.8 on 7/30  Continue to monitor 11. Leucocytosis: Resolved  Monitor for signs of fever or infection.  12. Morbid obesity: Encouraged weight loss to help promote overall health and mobility. Will consult dietitian to educate patient on appropriate diet. 13.  Hypotension  Labile on 7/30  Will consider decreasing Lasix if persistent/symptomatic  LOS: 3 days A FACE TO FACE EVALUATION WAS PERFORMED  Ankit Lorie Phenix 07/23/2019, 2:01 PM

## 2019-07-24 ENCOUNTER — Inpatient Hospital Stay (HOSPITAL_COMMUNITY): Payer: Medicare Other

## 2019-07-24 ENCOUNTER — Inpatient Hospital Stay (HOSPITAL_COMMUNITY): Payer: Medicare Other | Admitting: Occupational Therapy

## 2019-07-24 DIAGNOSIS — E876 Hypokalemia: Secondary | ICD-10-CM

## 2019-07-24 LAB — BASIC METABOLIC PANEL
Anion gap: 11 (ref 5–15)
BUN: 37 mg/dL — ABNORMAL HIGH (ref 6–20)
CO2: 30 mmol/L (ref 22–32)
Calcium: 9 mg/dL (ref 8.9–10.3)
Chloride: 94 mmol/L — ABNORMAL LOW (ref 98–111)
Creatinine, Ser: 1.25 mg/dL — ABNORMAL HIGH (ref 0.44–1.00)
GFR calc Af Amer: 55 mL/min — ABNORMAL LOW (ref >60)
GFR calc non Af Amer: 48 mL/min — ABNORMAL LOW (ref >60)
Glucose, Bld: 113 mg/dL — ABNORMAL HIGH (ref 70–99)
Potassium: 3.3 mmol/L — ABNORMAL LOW (ref 3.5–5.1)
Sodium: 135 mmol/L (ref 135–145)

## 2019-07-24 LAB — POTASSIUM: Potassium: 3.5 mmol/L (ref 3.5–5.1)

## 2019-07-24 MED ORDER — POTASSIUM CHLORIDE CRYS ER 20 MEQ PO TBCR
30.0000 meq | EXTENDED_RELEASE_TABLET | Freq: Every day | ORAL | Status: DC
  Administered 2019-07-24 – 2019-07-25 (×2): 30 meq via ORAL
  Filled 2019-07-24 (×2): qty 1

## 2019-07-24 NOTE — Progress Notes (Signed)
Physical Therapy Discharge Summary  Patient Details  Name: Kari [name] MRN: [id number] Date of Birth: 1961/02/19  Today's Date: 07/24/2019 PT Individual Time: 1101-1158 PT Individual Time Calculation (min): 57 min  Pt seen for grad day activities in preparation for discharge tomorrow. Pt able to perform functional bed mobility and transfers including toileting in room modified independent with RW (made mod I in room at end of session.) Gait training on unit with RW for functional mobility training and overall strengthening and endurance x 150' modified independent with noted wide BOS and antalgic gait pattern. Pt able to walk short distance functionally without AD at mod I level as well but due to tendency for furniture walking, recommend the RW for safety. Pt engaged in simulated car transfer at supervision level. Community mobility training going up/down ramp using rail for support at supervision. Stair negotiation training for home access with supervision. Pt reports having access to both rails at home and utilized this during session. Issued and reviewed HEP with patient as described below.    Access Code: [email or web address]  URL: [email or web address]  Date: 07/24/2019  Prepared by: Canary Brim   Exercises Supine Active Straight Leg Raise - 10 reps - 3 sets - 1x daily - 7x weekly Supine Bridge - 10 reps - 3 sets - 1x daily - 7x weekly Seated Long Arc Quad - 10 reps - 3 sets - 1x daily - 7x weekly Seated Hip Adduction Isometrics with Ball - 10 reps - 3 sets - 1x daily - 7x weekly Seated Hip Abduction - 10 reps - 3 sets - 1x daily - 7x weekly  Patient has met 8 of 9 long term goals due to improved activity tolerance, improved balance and decreased pain.  Patient to discharge at an ambulatory level Modified Independent.   Pt's sister will provide assist as needed upon d/c at home.  Reasons goals not met:  Pt did not meet the distance for the gait goal (200') due to pain and  fatigue. She has consistently been going about 150' when able to participate. Pt is mod I for household distances with RW. Pt can ambulate short distances without device also but tends to furniture walk.   Recommendation:  Patient will benefit from ongoing skilled PT services in home health setting to continue to advance safe functional mobility, address ongoing impairments in strength, balance, endurance, edema, and minimize fall risk.  Equipment: No equipment provided. Pt has RW and SPC.  Reasons for discharge: treatment goals met and discharge from hospital  Patient/family agrees with progress made and goals achieved: Yes  PT Discharge Precautions/Restrictions Precautions Precautions: Fall Precaution Comments: wounds on BLE Restrictions Weight Bearing Restrictions: No RLE Weight Bearing: Weight bearing as tolerated LLE Weight Bearing: Weight bearing as tolerated Pain Reports pain in BLE from wound vac removal earlier. Premedicated. Vision/Perception  Perception Perception: Within Functional Limits Praxis Praxis: Intact  Cognition Overall Cognitive Status: Within Functional Limits for tasks assessed Sensation Coordination Gross Motor Movements are Fluid and Coordinated: No(BLEs limited by pain and dressing from wounds) Motor  Motor Motor: Other (comment) Motor - Discharge Observations: gross movement still impaired due to pain in BLE due to wounds and dressings  Mobility Bed Mobility Bed Mobility: Supine to Sit;Sit to Supine Supine to Sit: Independent Sit to Supine: Independent Transfers Transfers: Sit to Stand;Stand to [employer] Transfers Sit to Stand: Independent with assistive device Stand to Sit: Independent with assistive device Stand Pivot Transfers: Independent with assistive device Transfer (Assistive device): Rolling walker  Locomotion  Gait Gait Assistance: Independent with assistive device Gait Distance (Feet): 150 Feet Assistive device: Rolling  walker Gait Gait: Yes Gait Pattern: Impaired Gait Pattern: Wide base of support;Decreased dorsiflexion - right;Decreased dorsiflexion - left Stairs / Additional Locomotion Stairs: Yes Stairs Assistance: Supervision/Verbal cueing Stair Management Technique: Two rails;Step to pattern Number of Stairs: 8 Height of Stairs: 6 Ramp: Supervision/Verbal cueing Curb: Supervision/Verbal cueing Wheelchair Mobility Wheelchair Mobility: No  Trunk/Postural Assessment  Cervical Assessment Cervical Assessment: Within Functional Limits Thoracic Assessment Thoracic Assessment: Within Functional Limits Lumbar Assessment Lumbar Assessment: Within Functional Limits(posterior pelvic tilt) Postural Control Postural Control: Within Functional Limits  Balance Balance Balance Assessed: Yes Static Sitting Balance Static Sitting - Level of Assistance: 6: Modified independent (Device/Increase time) Dynamic Sitting Balance Dynamic Sitting - Level of Assistance: 6: Modified independent (Device/Increase time) Static Standing Balance Static Standing - Level of Assistance: 6: Modified independent (Device/Increase time) Dynamic Standing Balance Dynamic Standing - Level of Assistance: 6: Modified independent (Device/Increase time) Extremity Assessment      RLE Assessment RLE Assessment: Exceptions to [facility] General Strength Comments: grossly 4/5; limited ankle due to ACE wrap LLE Assessment LLE Assessment: Exceptions to [facility] General Strength Comments: grossly 4/5; limited ankle due to ACE wrap    Juanna Cao, PT, DPT, CBIS  07/24/2019, 12:54 PM

## 2019-07-24 NOTE — Progress Notes (Signed)
Occupational Therapy Discharge Summary  Patient Details  Name: NIRA VISSCHER MRN: [id number] Date of Birth: 21-Jun-1961  Patient has met 9 of 9 long term goals due to improved activity tolerance, improved balance, ability to compensate for deficits and improved awareness.  Patient to discharge at overall Modified Independent level.  Patient's care partner is independent to provide the necessary intermittent assistance at discharge.    Reasons goals not met: N/A  Recommendation:  Patient will benefit from ongoing skilled OT services in home health setting to continue to advance functional skills in the area of BADL and Reduce care partner burden.  Equipment: wide BSC and tub bench  Reasons for discharge: treatment goals met and discharge from hospital  Patient/family agrees with progress made and goals achieved: Yes  OT Discharge Precautions/Restrictions  Precautions Precautions: Fall Precaution Comments: wounds on BLE Restrictions Weight Bearing Restrictions: No RLE Weight Bearing: Weight bearing as tolerated LLE Weight Bearing: Weight bearing as tolerated Vital Signs Therapy Vitals Temp: 98 F (36.7 C) Temp Source: Oral Pulse Rate: 73 Resp: 18 BP: (!) 98/57 Patient Position (if appropriate): Lying Oxygen Therapy SpO2: 99 % O2 Device: Room Air Pain Pain Assessment Pain Scale: 0-10 Pain Score: 5  Pain Location: Leg Pain Orientation: Right;Left Pain Descriptors / Indicators: Aching Patients Stated Pain Goal: 4 Pain Intervention(s): Medication (See eMAR) ADL ADL Eating: Independent Where Assessed-Eating: Chair Grooming: Modified independent Where Assessed-Grooming: Sitting at sink Upper Body Bathing: Modified independent Where Assessed-Upper Body Bathing: Sitting at sink Lower Body Bathing: Modified independent Where Assessed-Lower Body Bathing: Sitting at sink Upper Body Dressing: Modified independent (Device) Where Assessed-Upper Body Dressing:  Chair Lower Body Dressing: Modified independent Where Assessed-Lower Body Dressing: Chair Toileting: Modified independent Where Assessed-Toileting: Risk analyst Method: Tax inspector: Distant supervision Tub/Shower Transfer Method: Optometrist: Gaffer Baseline Vision/History: Wears glasses Wears Glasses: Reading only Patient Visual Report: No change from baseline Vision Assessment?: No apparent visual deficits Perception  Perception: Within Functional Limits Praxis Praxis: Intact Cognition Overall Cognitive Status: Within Functional Limits for tasks assessed Arousal/Alertness: Awake/alert Orientation Level: Oriented X4 Attention: Alternating Memory: Appears intact Awareness: Appears intact Problem Solving: Appears intact Safety/Judgment: Appears intact Sensation Sensation Light Touch: Appears Intact Coordination Gross Motor Movements are Fluid and Coordinated: No Fine Motor Movements are Fluid and Coordinated: Yes Coordination and Movement Description: decreased gross motor movements due to B LE pain and decreased activity tolerance Motor  Motor Motor: Other (comment) Motor - Discharge Observations: gross movement still impaired due to pain in BLE due to wounds and dressings Mobility  Bed Mobility Bed Mobility: Supine to Sit;Sit to Supine Supine to Sit: Independent Sit to Supine: Independent Transfers Sit to Stand: Independent with assistive device Stand to Sit: Independent with assistive device  Trunk/Postural Assessment  Cervical Assessment Cervical Assessment: Within Functional Limits Thoracic Assessment Thoracic Assessment: Within Functional Limits Lumbar Assessment Lumbar Assessment: Within Functional Limits(posterior pelvic tilt) Postural Control Postural Control: Within Functional Limits   Balance Balance Balance Assessed: Yes Static Sitting Balance Static Sitting - Level of Assistance: 6: Modified independent (Device/Increase time) Dynamic Sitting Balance Dynamic Sitting - Level of Assistance: 6: Modified independent (Device/Increase time) Static Standing Balance Static Standing - Level of Assistance: 6: Modified independent (Device/Increase time) Dynamic Standing Balance Dynamic Standing - Level of Assistance: 6: Modified independent (Device/Increase time) Extremity/Trunk Assessment RUE Assessment RUE Assessment: Within Functional Limits Active Range of Motion (AROM) Comments: WFL for all  functional mobility General Strength Comments: WFL for all functional mobility LUE Assessment LUE Assessment: Within Functional Limits Active Range of Motion (AROM) Comments: WFL for all functional mobility General Strength Comments: WFL for all functional mobility   Jaysha Lasure, [facility] 07/24/2019, 3:09 PM

## 2019-07-24 NOTE — Progress Notes (Signed)
Wound VAC removed this am. LLE wound had 50 cc in canister since Wednesday and does have a strong odor. Spoke with Junie Panning NP regarding foam on wound and she recommended leaving that in place. She will contact Dr. Sharol Given to see if he can check in on patient.   Right leg:

## 2019-07-24 NOTE — Progress Notes (Signed)
Social Work Discharge Note   The overall goal for the admission was met for: DC SAT 8/1  Discharge location: Yes-HOME WITH SISTER WHO CAN ASSIST WITH HER CARE  Length of Stay: Yes-5 DAYS  Discharge activity level: Yes-INDEPENDENT WITH DEVICE  Home/community participation: Yes  Services provided included: MD, RD, PT, OT, RN, CM, Pharmacy and SW  Financial Services: Medicare and Medicaid  Follow-up services arranged: Home Health: [facility], DME: [facility]-WIDE ROLLING WALKER, WIDE BEDSIDE COMMODE & TUB BENCH and Patient/Family has no preference for HH/DME agencies CANCELLED WALKER AND BSC HAS FROM 2017 AND IF WANTED NEEDED TO PRIVATE PAY FOR.  Comments (or additional information):PT REACHED MOD/I AND SISTER-[name] HAS BEEN ASSISTING PRIOR TO ADMISSION. PT FEELS READY TO GO HOME. WILL BE FOLLOWING UP WITH DR DUDA FOR HER WOUNDS  Patient/Family verbalized understanding of follow-up arrangements: Yes  Individual responsible for coordination of the follow-up plan: SELF & [name]-SISTER  Confirmed correct DME delivered: [name] 07/24/2019    [name]

## 2019-07-24 NOTE — Discharge Instructions (Signed)
Inpatient Rehab Discharge Instructions  Kari [name] Discharge date and time:  07/25/19  Activities/Precautions/ Functional Status: Activity: no lifting, driving, or strenuous exercise till cleared by MD Diet: low fat, low cholesterol diet Wound Care: Cleanse with normal saline daily. Then cover with adaptic, 4 X 4 gauze, Kerlix and then ace wrap. Change daily. Contact Dr.Duda if you develop any problems with your incision/wound--redness, swelling, increase in pain, drainage or if you develop fever or chills.    Functional status:  ___ No restrictions     ___ Walk up steps independently ___ 24/7 supervision/assistance   ___ Walk up steps with assistance ___ Intermittent supervision/assistance  ___ Bathe/dress independently _X__ Walk with walker     ___ Bathe/dress with assistance ___ Walk Independently    ___ Shower independently ___ Walk with assistance    ___ Shower with assistance _X__ No alcohol     ___ Return to work/school ________  Special Instructions:    COMMUNITY REFERRALS UPON DISCHARGE:    Home Health:   PT, RN  Agency:[facility] [phone] Date of last service:07/25/2019  Medical Equipment/Items Ordered: TUB BENCH  Agency/Supplier:[facility]   [place]  PATIENT DECLINED DUE TO PRIVATE PAY SINCE HAD ONE FROM 2017   My questions have been answered and I understand these instructions. I will adhere to these goals and the provided educational materials after my discharge from the hospital.  Patient/Caregiver Signature _______________________________ Date __________  Clinician Signature _______________________________________ Date __________  Please bring this form and your medication list with you to all your follow-up doctor's appointments.

## 2019-07-24 NOTE — Progress Notes (Signed)
Occupational Therapy Note  Patient Details  Name: Kari [name] MRN: [id number] Date of Birth: 1961/08/21  Today's Date: 07/24/2019 OT Individual Time: 6226-3335 OT Individual Time Calculation (min): 10 min  and Today's Date: 07/24/2019 OT Missed Time: 65 Minutes Missed Time Reason: Nursing care;Pain  Pt missed 65 mins scheduled OT treatment session secondary to RN and PA in room to remove wound VAC from BLE.  Upon return, pt stating she was in too much pain 8/10 from having just had VACs removed.  Discussed current OT goals and d/c tomorrow and encouraged participation.  Pt agreeable to participate in afternoon OT session.   Simonne Come 07/24/2019, 11:56 AM

## 2019-07-24 NOTE — Progress Notes (Signed)
Occupational Therapy Session Note  Patient Details  Name: Kari [name] MRN: [id number] Date of Birth: 26-Feb-1961  Today's Date: 07/24/2019 OT Individual Time: 1400-1500 OT Individual Time Calculation (min): 60 min    Short Term Goals: Week 1:  OT Short Term Goal 1 (Week 1): STG = LTGs due to ELOS  Skilled Therapeutic Interventions/Progress Updates:    Completed ADL retraining at overall Mod I level.  Pt received upright having just returned from toilet.  Pt completing transfers with RW at Mod I level.  Pt ambulated around room to gather clothing and bathing items prior to bathing at sink at sit > stand level.  Pt donned dress and slip on shoes without assistance.  Engaged in discussion regarding energy conservation as well as continued activity tolerance and progression to increase independence.  Pt asking questions about diet, RN provided pt with education handouts.  Pt pleased with progress and is ready for d/c home tomorrow.  Therapy Documentation Precautions:  Precautions Precautions: Fall Precaution Comments: wounds on BLE Restrictions Weight Bearing Restrictions: No RLE Weight Bearing: Weight bearing as tolerated LLE Weight Bearing: Weight bearing as tolerated Vital Signs: Therapy Vitals Temp: 98 F (36.7 C) Temp Source: Oral Pulse Rate: 73 Resp: 18 BP: (!) 98/57 Patient Position (if appropriate): Lying Oxygen Therapy SpO2: 99 % O2 Device: Room Air Pain: Pain Assessment Pain Scale: 0-10 Pain Score: 5  Pain Location: Leg Pain Orientation: Right;Left Pain Descriptors / Indicators: Aching Patients Stated Pain Goal: 4 Pain Intervention(s): Medication (See eMAR) ADL: ADL Eating: Independent Where Assessed-Eating: Chair Grooming: Modified independent Where Assessed-Grooming: Sitting at sink Upper Body Bathing: Modified independent Where Assessed-Upper Body Bathing: Sitting at sink Lower Body Bathing: Modified independent Where Assessed-Lower Body Bathing:  Sitting at sink Upper Body Dressing: Modified independent (Device) Where Assessed-Upper Body Dressing: Chair Lower Body Dressing: Modified independent Where Assessed-Lower Body Dressing: Chair Toileting: Modified independent Where Assessed-Toileting: Glass blower/designer: Diplomatic Services operational officer Method: Public librarian Transfer: Distant supervision Tub/Shower Transfer Method: Optometrist: Transfer tub bench   Therapy/Group: Individual Therapy  Simonne Come 07/24/2019, 3:09 PM

## 2019-07-24 NOTE — Progress Notes (Signed)
PHYSICAL MEDICINE & REHABILITATION PROGRESS NOTE  Subjective/Complaints: Patient seen sitting up in bed this morning.  She states she slept well overnight.  She is looking for discharge tomorrow.  Discussed discontinuing wound VAC.  ROS: Denies CP, shortness of breath, nausea, vomiting, diarrhea.  Objective: Vital Signs: Blood pressure 126/72, pulse 74, temperature 98.8 F (37.1 C), temperature source Oral, resp. rate 18, height 5\' 7"  (1.702 m), weight 134.2 kg, last menstrual period 11/21/2011, SpO2 92 %. No results found. Recent Labs    07/23/19 0549  WBC 10.5  HGB 10.8*  HCT 34.5*  PLT 339   Recent Labs    07/24/19 0723  NA 135  K 3.3*  CL 94*  CO2 30  GLUCOSE 113*  BUN 37*  CREATININE 1.25*  CALCIUM 9.0    Physical Exam: BP 126/72 (BP Location: Right Arm)   Pulse 74   Temp 98.8 F (37.1 C) (Oral)   Resp 18   Ht 5\' 7"  (1.702 m)   Wt 134.2 kg   LMP 11/21/2011   SpO2 92%   BMI 46.34 kg/m  Constitutional: NAD. Vital signs reviewed.  Morbidly obese. HENT: Normocephalic.  Atraumatic. Eyes: EOMI.  No discharge. Cardiovascular: No JVD. Respiratory: Normal  effort. GI: Non-distended. Musc: Bilateral lower extremities with tenderness and edema Neurological: Alert and oriented Motor: Grossly 4+/5 bilateral upper extremities, unchanged Bilateral lower extremities: Hip flexion 4/5, distally 4+/5, ankle limited by dressing, unchanged Skin: Bilateral lower extremities with dressing and VACs. Psychiatric: She has a normal mood and affect. Herbehavior is normal.Judgmentand thought contentnormal.   Assessment/Plan: 1. Functional deficits secondary to debility which require 3+ hours per day of interdisciplinary therapy in a comprehensive inpatient rehab setting.  Physiatrist is providing close team supervision and 24 hour management of active medical problems listed below.  Physiatrist and rehab team continue to assess barriers to discharge/monitor  patient progress toward functional and medical goals  Care Tool:  Bathing    Body parts bathed by patient: Right arm, Left arm, Chest, Abdomen, Face, Front perineal area, Buttocks, Right upper leg, Left upper leg   Body parts bathed by helper: Left lower leg, Right lower leg Body parts n/a: Left lower leg, Right lower leg   Bathing assist Assist Level: Set up assist     Upper Body Dressing/Undressing Upper body dressing   What is the patient wearing?: Dress    Upper body assist Assist Level: Set up assist    Lower Body Dressing/Undressing Lower body dressing    Lower body dressing activity did not occur: Refused(wears dresses and refused underware per OT doc)       Lower body assist       Toileting Toileting    Toileting assist Assist for toileting: Supervision/Verbal cueing     Transfers Chair/bed transfer  Transfers assist     Chair/bed transfer assist level: Independent with assistive device     Locomotion Ambulation   Ambulation assist      Assist level: Independent with assistive device Assistive device: Walker-rolling Max distance: 150'   Walk 10 feet activity   Assist     Assist level: Independent with assistive device Assistive device: Walker-rolling   Walk 50 feet activity   Assist    Assist level: Independent with assistive device Assistive device: Walker-rolling    Walk 150 feet activity   Assist    Assist level: Independent with assistive device Assistive device: Walker-rolling    Walk 10 feet on uneven surface  activity  Assist Walk 10 feet on uneven surfaces activity did not occur: Safety/medical concerns(decresed strength/balance)   Assist level: Supervision/Verbal cueing Assistive device: Other (comment)(rail)   Wheelchair     Assist Will patient use wheelchair at discharge?: No Type of Wheelchair: Manual           Wheelchair 50 feet with 2 turns activity    Assist        Assist  Level: Set up assist, Minimal Assistance - Patient > 75%   Wheelchair 150 feet activity     Assist Wheelchair 150 feet activity did not occur: Safety/medical concerns(decresed strength/endurance)          Medical Problem List and Plan: 1.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 Antithrombotics: -DVT/anticoagulation:Pharmaceutical:Lovenox -antiplatelet therapy: low dose ASA 3. Pain Management:tylenol or oxycodone prn. 4. Mood:LCSW to follow for evaluation and support. -antipsychotic agents: N/A 5. Neuropsych: This patientiscapable of making decisions onherown behalf. 6. Skin/Wound Care:Wound VAC DC'd (see PA notes for picture).  PA discussed with Ortho PA, await further recs per orthopedic surgery due to odor and further wound care.  7. Fluids/Electrolytes/Nutrition:Monitor I/Os.  Added supplements to promote healing.  8. CKD: Avoid Nephrotoxic medications. SCr 1.3 at baseline?   Creatinine 1.25 on 7/31  Encourage fluids  Continue to monitor 9. NISCM/s/p ICD: On lasix and coreg. Monitor for hypotension.  Filed Weights   07/20/19 1440 07/23/19 0500  Weight: 120.2 kg 134.2 kg   Daily weights ordered  ?Reliability on 7/30 10. Anemia of chronic disease:   Hemoglobin 10.8 on 7/30  Continue to monitor 11. Leucocytosis: Resolved  Monitor for signs of fever or infection.  12. Morbid obesity: Encouraged weight loss to help promote overall health and mobility. Will consult dietitian to educate patient on appropriate diet. 13.  Hypotension  Labile on 7/31  Will consider decreasing Lasix if persistent/symptomatic 14.  Hypokalemia  Potassium 3.3 on 7/31  Supplement initiated on 7/31  LOS: 4 days A FACE TO FACE EVALUATION WAS PERFORMED  Berry Godsey Lorie Phenix 07/24/2019, 12:13 PM

## 2019-07-25 DIAGNOSIS — G8918 Other acute postprocedural pain: Secondary | ICD-10-CM

## 2019-07-25 MED ORDER — POTASSIUM CHLORIDE CRYS ER 15 MEQ PO TBCR: 30.0000 meq | EXTENDED_RELEASE_TABLET | Freq: Every day | ORAL | 0 refills | Status: DC

## 2019-07-25 MED ORDER — OXYCODONE HCL 5 MG PO TABS: 5.0000 mg | ORAL_TABLET | Freq: Two times a day (BID) | ORAL | 0 refills | Status: DC | PRN

## 2019-07-25 MED ORDER — DOCUSATE SODIUM 100 MG PO CAPS: 100.0000 mg | ORAL_CAPSULE | Freq: Two times a day (BID) | ORAL | 0 refills | Status: DC

## 2019-07-25 MED ORDER — METHOCARBAMOL 500 MG PO TABS: 500.0000 mg | ORAL_TABLET | Freq: Three times a day (TID) | ORAL | 0 refills | Status: DC | PRN

## 2019-07-25 NOTE — Progress Notes (Signed)
Patient seen and examined today sitting up in her chair.  Ready for discharge.  Instructions, follow-up appointments, medications explained to and reviewed with patient along with restrictions and mobility instructions.  Please see discharge summary for details.

## 2019-07-25 NOTE — Progress Notes (Signed)
Patient discharged to home with family, all belongings accounted for.

## 2019-07-26 DIAGNOSIS — L97829 Non-pressure chronic ulcer of other part of left lower leg with unspecified severity: Secondary | ICD-10-CM | POA: Diagnosis not present

## 2019-07-26 DIAGNOSIS — I87313 Chronic venous hypertension (idiopathic) with ulcer of bilateral lower extremity: Secondary | ICD-10-CM | POA: Diagnosis not present

## 2019-07-26 DIAGNOSIS — D631 Anemia in chronic kidney disease: Secondary | ICD-10-CM | POA: Diagnosis not present

## 2019-07-26 DIAGNOSIS — E785 Hyperlipidemia, unspecified: Secondary | ICD-10-CM | POA: Diagnosis not present

## 2019-07-26 DIAGNOSIS — N183 Chronic kidney disease, stage 3 (moderate): Secondary | ICD-10-CM | POA: Diagnosis not present

## 2019-07-26 DIAGNOSIS — I252 Old myocardial infarction: Secondary | ICD-10-CM | POA: Diagnosis not present

## 2019-07-26 DIAGNOSIS — I130 Hypertensive heart and chronic kidney disease with heart failure and stage 1 through stage 4 chronic kidney disease, or unspecified chronic kidney disease: Secondary | ICD-10-CM | POA: Diagnosis not present

## 2019-07-26 DIAGNOSIS — I5033 Acute on chronic diastolic (congestive) heart failure: Secondary | ICD-10-CM | POA: Diagnosis not present

## 2019-07-26 DIAGNOSIS — J45909 Unspecified asthma, uncomplicated: Secondary | ICD-10-CM | POA: Diagnosis not present

## 2019-07-26 DIAGNOSIS — L97819 Non-pressure chronic ulcer of other part of right lower leg with unspecified severity: Secondary | ICD-10-CM | POA: Diagnosis not present

## 2019-07-26 DIAGNOSIS — F329 Major depressive disorder, single episode, unspecified: Secondary | ICD-10-CM | POA: Diagnosis not present

## 2019-07-27 ENCOUNTER — Telehealth: Payer: Self-pay | Admitting: *Deleted

## 2019-07-27 ENCOUNTER — Telehealth: Payer: Self-pay | Admitting: Orthopedic Surgery

## 2019-07-27 DIAGNOSIS — I87313 Chronic venous hypertension (idiopathic) with ulcer of bilateral lower extremity: Secondary | ICD-10-CM | POA: Diagnosis not present

## 2019-07-27 DIAGNOSIS — N183 Chronic kidney disease, stage 3 (moderate): Secondary | ICD-10-CM | POA: Diagnosis not present

## 2019-07-27 DIAGNOSIS — I130 Hypertensive heart and chronic kidney disease with heart failure and stage 1 through stage 4 chronic kidney disease, or unspecified chronic kidney disease: Secondary | ICD-10-CM | POA: Diagnosis not present

## 2019-07-27 DIAGNOSIS — I5033 Acute on chronic diastolic (congestive) heart failure: Secondary | ICD-10-CM | POA: Diagnosis not present

## 2019-07-27 DIAGNOSIS — L97829 Non-pressure chronic ulcer of other part of left lower leg with unspecified severity: Secondary | ICD-10-CM | POA: Diagnosis not present

## 2019-07-27 DIAGNOSIS — L97819 Non-pressure chronic ulcer of other part of right lower leg with unspecified severity: Secondary | ICD-10-CM | POA: Diagnosis not present

## 2019-07-27 NOTE — Telephone Encounter (Signed)
Pt had I&D of leg ulcerations with skin graft.  and HHN wants to know what you want them to do for wound care. D/C summary says wash, adaptic and dry dressing is this correct?

## 2019-07-27 NOTE — Telephone Encounter (Signed)
Estill Bamberg with [facility] left a voicemail message wanting to know what Dr. Sharol Given wants Nursing to do as far as wound care.  CB#[phone].  Thank you.

## 2019-07-27 NOTE — Discharge Summary (Addendum)
Physician Discharge Summary  Patient ID: Kari [name] MRN: [id number] DOB/AGE: Nov 23, 1961 58 y.o.  Admit date: 07/20/2019 Discharge date: 07/25/2019  Discharge Diagnoses:  Principal Problem:   Physical debility Active Problems:   Hypertension   Nonischemic cardiomyopathy ([employer])   Labile blood pressure   Postoperative pain   Discharged Condition: stable   Significant Diagnostic Studies: N/A No results found.  Labs:  Basic Metabolic Panel: Recent Labs  Lab 07/21/19 0615 07/24/19 0723 07/24/19 1840  NA 136 135  --   K 3.5 3.3* 3.5  CL 95* 94*  --   CO2 32 30  --   GLUCOSE 115* 113*  --   BUN 22* 37*  --   CREATININE 1.17* 1.25*  --   CALCIUM 9.0 9.0  --     CBC: Recent Labs  Lab 07/21/19 0615 07/23/19 0549  WBC 9.3 10.5  NEUTROABS 6.3 7.3  HGB 10.2* 10.8*  HCT 33.5* 34.5*  MCV 91.5 91.0  PLT 324 339    CBG: No results for input(s): GLUCAP in the last 168 hours.   Brief HPI:    KALEE BROXTON is a 58 year old female with history of HTN, ICM/V. fib s/p ICD, CKD, morbid obesity, venous stasis ulcers bilateral lower extremity status post I&D with poor healing.  She was admitted on 07/15/2019 for repeat I&D with placement of Praveena VAC by Dr. Sharol Given.  Postop taken back to the OR for I&D with STSG and was maintained on IV antibiotics empirically till discharge.  Wound VAC to remain in place for a week.  Therapy evaluations done revealing debility and CIR was recommended for follow-up therapy   Hospital Course: PATTE WINKEL was admitted to rehab 07/20/2019 for inpatient therapies to consist of PT and OT at least three hours five days a week. Past admission physiatrist, therapy team and rehab RN have worked together to provide customized collaborative inpatient rehab.  She was maintained on Lovenox for DVT prophylaxis during his stay.  Her wound VAC was discontinued on 07/31 and daily dry dressings ordered for per orthopedic input.  Leukocytosis has resolved and  she has been afebrile during her stay blood pressures have been been monitored on twice daily basis with daily weights and she continues on Lasix and Coreg as is asymptomatic.  Hypokalemia with addition of K dur for supplementation. Pain has been reasonably controlled with as needed use of oxycodone..  Follow-up check of lites revealed SCr at 1.25.  Follow up CBC revealed acute blood loss anemia to be stable.  She has made good gains during her rehab stay and is at modified independent level. [facility] to continue to provide HHPT and [facility] for wound monitoring after discharge.    Rehab course: During patient's stay in rehab brief team conference was held to discuss  patient's progress, set goals and discuss barriers to discharge. At admission, patient required min to mod assist with ADL task and min assist with mobility.  She  has had improvement in activity tolerance, balance, postural control as well as ability to compensate for deficits.  She is able to complete ADL tasks at modified independent level. She is modified independent for transfers and is able to ambulate 150 feet with rolling walker.    Disposition: Home  Diet: Heart Healthy.   Special Instructions: 1. Cleanse wound with normal saline, apply adaptic then cover with dry dressing. Change daily. 2.  Recommend repeat  BMET  1 to 2 weeks to monitor renal status and  potassium levels.    Discharge Instructions    Ambulatory referral to Physical Medicine Rehab   Complete by: As directed    1-2 weeks transitional care appt     Allergies as of 07/25/2019      Reactions   Tramadol Other (See Comments)   hallucination      Medication List    TAKE these medications   acetaminophen 325 MG tablet Commonly known as: TYLENOL Take 1-2 tablets (325-650 mg total) by mouth every 4 (four) hours as needed for mild pain.   albuterol 108 (90 Base) MCG/ACT inhaler Commonly known as: VENTOLIN HFA Inhale 2 puffs into the lungs every 6 (six)  hours as needed for shortness of breath.   allopurinol 100 MG tablet Commonly known as: ZYLOPRIM Take 2 tablets (200 mg total) by mouth daily. What changed:   how much to take  when to take this   aspirin EC 81 MG tablet Take 81 mg by mouth daily.   carvedilol 6.25 MG tablet Commonly known as: COREG Take 1 tablet (6.25 mg total) by mouth 2 (two) times daily.   docusate sodium 100 MG capsule Commonly known as: COLACE Take 1 capsule (100 mg total) by mouth 2 (two) times daily.   furosemide 40 MG tablet Commonly known as: LASIX Take 1.5 tablets (60 mg total) by mouth daily. Take 1-1/2 tablets to = 60 mg What changed: additional instructions   methocarbamol 500 MG tablet Commonly known as: ROBAXIN Take 1 tablet (500 mg total) by mouth every 8 (eight) hours as needed for muscle spasms.   oxyCODONE 5 MG immediate release tablet--Rx # 60 pills Commonly known as: Oxy IR/ROXICODONE Take 1 tablet (5 mg total) by mouth 2 (two) times daily as needed for severe pain.   potassium chloride SA 15 MEQ tablet Commonly known as: KLOR-CON M15 Take 2 tablets (30 mEq total) by mouth daily.      Follow-up Information    [name], MD Follow up.   Specialty: Orthopedic Surgery Why: call for follow up appointment Contact information: [place] [address] [phone]        Jamse Arn, MD Follow up.   Specialty: Physical Medicine and Rehabilitation Why: Office will call you with follow up appointment Contact information: [address] [place] [address] [phone]        Rutherford Guys, MD. Call in 1 day(s).   Specialty: Family Medicine Why: for post hospital follow up Contact information: [address] [place] [address] [phone]           Signed: Bary Leriche 07/27/2019, 10:50 PM Patient was seen, face-face, and physical exam performed by me on day of discharge, greater than 30 minutes of total time spent.   Delice Lesch, MD, ABPMR

## 2019-07-27 NOTE — Telephone Encounter (Signed)
Ideally compression wrap with dry dressing change 2-3 times a week.

## 2019-07-27 NOTE — Telephone Encounter (Addendum)
Transitional Care call-11:08 am  I spoke with Mrs Ohanian.  She is on her way to see Dr Sharol Given currently.  I will call back this afternoon to complete the TC call.    1. Are you/is patient experiencing any problems since coming home? Are there any questions regarding any aspect of care?NO 2. Are there any questions regarding medications administration/dosing? Are meds being taken as prescribed? Patient should review meds with caller to confirm Yes. I have requested that she bring her oxycodone bottle to appt to see Dr Posey Pronto, 3. Have there been any falls? NO 4. Has Home Health been to the house and/or have they contacted you? If not, have you tried to contact them? Can we help you contact them?YES PT is there now and [facility] has called Dr Sharol Given for updated wound orders. She was supposed to see Dr Sharol Given today but had problems with transportation. 5. Are bowels and bladder emptying properly? Are there any unexpected incontinence issues? If applicable, is patient following bowel/bladder programs? NO PROBLEMS 6. Any fevers, problems with breathing, unexpected pain? TAKING OXYCODONE BID AS NEEDED 7. Are there any skin problems or new areas of breakdown? NO, WOUND CARE PER DR DUDA 8. Has the patient/family member arranged specialty MD follow up (ie cardiology/neurology/renal/surgical/etc)?  Can we help arrange? WE HAD HER SCHEDULED TO SEE DR PATEL THIS Apr 07, 2023 BUT HER COUSIN DIED AND SHE IS LEAVING TO GO TO [place]. WILL NOT BE BACK UNTIL THE FOLLOWING Tuesday 08/04/11 9. Does the patient need any other services or support that we can help arrange? NO 10. Are caregivers following through as expected in assisting the patient?YES 11. Has the patient quit smoking, drinking alcohol, or using drugs as recommended? N/A  Appointment  [place]

## 2019-07-28 ENCOUNTER — Encounter: Payer: Self-pay | Admitting: Family

## 2019-07-28 ENCOUNTER — Other Ambulatory Visit: Payer: Self-pay

## 2019-07-28 ENCOUNTER — Ambulatory Visit (INDEPENDENT_AMBULATORY_CARE_PROVIDER_SITE_OTHER): Payer: Medicare Other | Admitting: Family

## 2019-07-28 VITALS — Ht 67.0 in | Wt 295.9 lb

## 2019-07-28 DIAGNOSIS — Z945 Skin transplant status: Secondary | ICD-10-CM

## 2019-07-28 DIAGNOSIS — L97929 Non-pressure chronic ulcer of unspecified part of left lower leg with unspecified severity: Secondary | ICD-10-CM

## 2019-07-28 DIAGNOSIS — L97209 Non-pressure chronic ulcer of unspecified calf with unspecified severity: Secondary | ICD-10-CM

## 2019-07-28 DIAGNOSIS — I83002 Varicose veins of unspecified lower extremity with ulcer of calf: Secondary | ICD-10-CM

## 2019-07-28 DIAGNOSIS — I87333 Chronic venous hypertension (idiopathic) with ulcer and inflammation of bilateral lower extremity: Secondary | ICD-10-CM

## 2019-07-28 DIAGNOSIS — L97919 Non-pressure chronic ulcer of unspecified part of right lower leg with unspecified severity: Secondary | ICD-10-CM

## 2019-07-28 NOTE — Telephone Encounter (Signed)
I called and lm on vm to advise of message below. Adaptic and profore dressing change three times a week. To call with questions.

## 2019-07-28 NOTE — Progress Notes (Signed)
Post-Op Visit Note   Patient: Kari [name]           Date of Birth: 04-04-1961           MRN: [id number] Visit Date: 07/28/2019 PCP: Rutherford Guys, MD  Chief Complaint:  Chief Complaint  Patient presents with  . Right Leg - Routine Post Op    07/17/2019 Repeat I&D BLE  . Left Leg - Routine Post Op    HPI:  HPI The patient is a 58 year old woman seen today nearly 2 weeks status post repeat irrigation and debridement bilateral lower extremity ulcerations with split thickness skin grafting. Ortho Exam On examination bilateral lower extremities she has some wrinkling of the skin from compression wrapping however she does have significant edema.  There still are staples in place with the graft material bilateral lower extremity ulcers.  Significant fibrinous tissue and slough in the wound beds.  There is active serous drainage.  There is no purulence no odor no surrounding erythema no surrounding maceration no sign of infection.  Visit Diagnoses:  1. H/O skin graft   2. Idiopathic chronic venous hypertension of both lower extremities with ulcer and inflammation (HCC)   3. Venous stasis ulcer of calf, unspecified laterality, unspecified ulcer stage, unspecified whether varicose veins present ([place])     Plan: We will apply 4 x 4 gauze and Dynaflex wraps bilaterally these will be changed twice weekly.  Patient prefers to do this in the office with [place].  We will see her back in the office on Friday.  Discussed elevation.  Follow-Up Instructions: Return in about 1 week (around 08/04/2019).   Imaging: No results found.  Orders:  No orders of the defined types were placed in this encounter.  No orders of the defined types were placed in this encounter.    PMFS History: Patient Active Problem List   Diagnosis Date Noted  . Postoperative pain   . Labile blood pressure   . Nonischemic cardiomyopathy ([place])   . Physical debility 07/20/2019  . Idiopathic chronic venous  hypertension of lower extremity with ulcer, bilateral ([place]) 07/15/2019  . H/O skin graft 05/13/2019  . CKD (chronic kidney disease), stage III ([place]) 01/13/2019  . Infected wound 12/26/2018  . Idiopathic chronic venous hypertension of both lower extremities with ulcer and inflammation ([place])   . History of fall 05/10/2018  . Hyperkalemia 02/21/2018  . Hypotension 02/21/2018  . Sepsis ([place]) 02/21/2018  . Encounter for central line placement   . Mitral regurgitation 10/30/2017  . Acute kidney injury ([place]) 07/21/2017  . Generalized weakness 07/20/2017  . Shock circulatory ([place])   . Acute respiratory failure with hypoxia ([place])   . Pulmonary edema   . Cardiac arrest ([place]) 06/26/2017  . Ventricular fibrillation ([place])   . Acute on chronic diastolic heart failure ([place])   . Abnormal ECG   . Cellulitis 04/02/2017  . AKI (acute kidney injury) ([place]) 12/17/2016  . Cellulitis and abscess of right lower extremity 12/17/2016  . Syncope and collapse 12/17/2016  . Peripheral edema 12/17/2016  . Hypokalemia 12/17/2016  . Anemia of chronic disease 12/17/2016  . Acute diastolic CHF (congestive heart failure) ([place]) 03/14/2014  . CHF (congestive heart failure) ([place]) 03/12/2014  . Sarcoidosis ([place]) 03/12/2014  . Severe sepsis ([place]) 10/17/2013  . ARF (acute renal failure) ([place]) 10/08/2013  . Cellulitis of multiple sites of lower extremity 10/08/2013  . Morbid obesity ([place]) 10/08/2013  . Volume depletion 10/08/2013  . Venous (peripheral) insufficiency 10/28/2012  .  Mediastinal lymphadenopathy 10/16/2012  . Pulmonary nodules 10/16/2012  . 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 Chest pain 09/02/2012  . Asthma   . Hypertension   . Depression   . Venous stasis ulcers ([place]) 07/29/2012  . Varicose veins of lower extremities with ulcer ([place]) 07/08/2012  . Venous ulcer of leg ([place]) 07/08/2012   Past Medical History:  Diagnosis Date  . Arthritis   .  Asthma   . CHF (congestive heart failure) ([place]) 03/12/2014   a. EF 40-45% by echo in 06/2017 with normal cors by cath. ICD placed following VT arrest  . Depression   . Headache(784.0)   . History of blood transfusion   . Hyperlipidemia   . Hypertension   . Lung nodules   . Myocardial infarction ([place]) 06/26/2014  . Renal disorder    followed by [facility]  . Sarcoidosis   . Ulcer    recurring, from chronic venous insufficiency    Family History  Problem Relation Age of Onset  . Heart failure Mother   . Diabetes Mellitus II Mother   . Hypertension Mother   . Cancer Mother        unknown type  . Heart disease Father   . Stroke Father   . Diabetes Mellitus II Father     Past Surgical History:  Procedure Laterality Date  . cataract surgery Left 07-2013  . I&D EXTREMITY Bilateral 12/31/2018   Procedure: DEBRIDEMENT BILATERAL LEGS, APPLY VAC X 2;  Surgeon: [name], MD;  Location: [facility];  Service: Orthopedics;  Laterality: Bilateral;  . I&D EXTREMITY Bilateral 01/02/2019   Procedure: REPEAT DEBRIDEMENT BILATERAL LEGS, APPLY VAC X 2;  Surgeon: [name], MD;  Location: [facility];  Service: Orthopedics;  Laterality: Bilateral;  . I&D EXTREMITY Bilateral 07/15/2019   Procedure: IRRIGATION AND DEBRIDEMENT VENOUS STASIS INSUFFICIENCY ULCERATIONS BILATERAL LOWER EXTREMITIES;  Surgeon: [name], MD;  Location: [facility];  Service: Orthopedics;  Laterality: Bilateral;  . ICD IMPLANT N/A 07/05/2017   Procedure: ICD Implant;  Surgeon: Constance Haw, MD;  Location: [facility];  Service: Cardiovascular;  Laterality: N/A;  . LEFT HEART CATH AND CORONARY ANGIOGRAPHY N/A 07/03/2017   Procedure: Left Heart Cath and Coronary Angiography;  Surgeon: Belva Crome, MD;  Location: [facility];  Service: Cardiovascular;  Laterality: N/A;  . SKIN SPLIT GRAFT Bilateral 01/07/2019   Procedure: SPLIT THICKNESS SKIN GRAFT BILATERAL LEGS, APPLY VAC;  Surgeon: [name], MD;   Location: [facility];  Service: Orthopedics;  Laterality: Bilateral;  . SKIN SPLIT GRAFT Bilateral 07/17/2019   Procedure: REPEAT IRRIGATION AND DEBRIDEMENT BILATERAL LOWER EXTREMITIES, SKIN GRAFT;  Surgeon: [name], MD;  Location: [facility];  Service: Orthopedics;  Laterality: Bilateral;   Social History   Occupational History  . Not on file  Tobacco Use  . Smoking status: Never Smoker  . Smokeless tobacco: Never Used  Substance and Sexual Activity  . Alcohol use: No  . Drug use: No  . Sexual activity: Not Currently

## 2019-07-29 ENCOUNTER — Telehealth: Payer: Self-pay

## 2019-07-29 ENCOUNTER — Other Ambulatory Visit: Payer: Self-pay | Admitting: Cardiology

## 2019-07-29 ENCOUNTER — Telehealth: Payer: Self-pay | Admitting: Orthopedic Surgery

## 2019-07-29 DIAGNOSIS — L97819 Non-pressure chronic ulcer of other part of right lower leg with unspecified severity: Secondary | ICD-10-CM | POA: Diagnosis not present

## 2019-07-29 DIAGNOSIS — I130 Hypertensive heart and chronic kidney disease with heart failure and stage 1 through stage 4 chronic kidney disease, or unspecified chronic kidney disease: Secondary | ICD-10-CM | POA: Diagnosis not present

## 2019-07-29 DIAGNOSIS — N183 Chronic kidney disease, stage 3 (moderate): Secondary | ICD-10-CM | POA: Diagnosis not present

## 2019-07-29 DIAGNOSIS — I5033 Acute on chronic diastolic (congestive) heart failure: Secondary | ICD-10-CM | POA: Diagnosis not present

## 2019-07-29 DIAGNOSIS — L97829 Non-pressure chronic ulcer of other part of left lower leg with unspecified severity: Secondary | ICD-10-CM | POA: Diagnosis not present

## 2019-07-29 DIAGNOSIS — I87313 Chronic venous hypertension (idiopathic) with ulcer of bilateral lower extremity: Secondary | ICD-10-CM | POA: Diagnosis not present

## 2019-07-29 NOTE — Telephone Encounter (Signed)
Sondra from [facility] was called and informed per Dr Sharol Given requested for skilled nursing to be on hold until further notice. Patient will be coming into our office for dressing changes. Lujean Rave understood these changes as well.

## 2019-07-29 NOTE — Telephone Encounter (Signed)
Sondra with [facility] would like to clarify if patient will need skilled nursing or just physical therapy?  Cb# is [phone].  Please advise.  Thank you.

## 2019-07-29 NOTE — Telephone Encounter (Signed)
Gracie with [facility] request verbal orders for  2 times a week for 6 week then 1 time a week for 3 weeks Gracie's call back # [phone]

## 2019-07-29 NOTE — Telephone Encounter (Signed)
Kari [name] was called and given a verbal okay for orders.

## 2019-07-31 ENCOUNTER — Encounter: Payer: Medicare Other | Admitting: Physical Medicine & Rehabilitation

## 2019-07-31 DIAGNOSIS — I5033 Acute on chronic diastolic (congestive) heart failure: Secondary | ICD-10-CM | POA: Diagnosis not present

## 2019-07-31 DIAGNOSIS — I130 Hypertensive heart and chronic kidney disease with heart failure and stage 1 through stage 4 chronic kidney disease, or unspecified chronic kidney disease: Secondary | ICD-10-CM | POA: Diagnosis not present

## 2019-07-31 DIAGNOSIS — I87313 Chronic venous hypertension (idiopathic) with ulcer of bilateral lower extremity: Secondary | ICD-10-CM | POA: Diagnosis not present

## 2019-07-31 DIAGNOSIS — L97829 Non-pressure chronic ulcer of other part of left lower leg with unspecified severity: Secondary | ICD-10-CM | POA: Diagnosis not present

## 2019-07-31 DIAGNOSIS — L97819 Non-pressure chronic ulcer of other part of right lower leg with unspecified severity: Secondary | ICD-10-CM | POA: Diagnosis not present

## 2019-07-31 DIAGNOSIS — N183 Chronic kidney disease, stage 3 (moderate): Secondary | ICD-10-CM | POA: Diagnosis not present

## 2019-08-03 DIAGNOSIS — I5033 Acute on chronic diastolic (congestive) heart failure: Secondary | ICD-10-CM | POA: Diagnosis not present

## 2019-08-03 DIAGNOSIS — I130 Hypertensive heart and chronic kidney disease with heart failure and stage 1 through stage 4 chronic kidney disease, or unspecified chronic kidney disease: Secondary | ICD-10-CM | POA: Diagnosis not present

## 2019-08-03 DIAGNOSIS — N183 Chronic kidney disease, stage 3 (moderate): Secondary | ICD-10-CM | POA: Diagnosis not present

## 2019-08-03 DIAGNOSIS — I87313 Chronic venous hypertension (idiopathic) with ulcer of bilateral lower extremity: Secondary | ICD-10-CM | POA: Diagnosis not present

## 2019-08-03 DIAGNOSIS — L97819 Non-pressure chronic ulcer of other part of right lower leg with unspecified severity: Secondary | ICD-10-CM | POA: Diagnosis not present

## 2019-08-03 DIAGNOSIS — L97829 Non-pressure chronic ulcer of other part of left lower leg with unspecified severity: Secondary | ICD-10-CM | POA: Diagnosis not present

## 2019-08-04 ENCOUNTER — Encounter (HOSPITAL_COMMUNITY): Payer: Self-pay | Admitting: Orthopedic Surgery

## 2019-08-04 ENCOUNTER — Ambulatory Visit: Payer: Medicare Other | Admitting: Family

## 2019-08-07 DIAGNOSIS — I5033 Acute on chronic diastolic (congestive) heart failure: Secondary | ICD-10-CM | POA: Diagnosis not present

## 2019-08-07 DIAGNOSIS — N183 Chronic kidney disease, stage 3 (moderate): Secondary | ICD-10-CM | POA: Diagnosis not present

## 2019-08-07 DIAGNOSIS — I130 Hypertensive heart and chronic kidney disease with heart failure and stage 1 through stage 4 chronic kidney disease, or unspecified chronic kidney disease: Secondary | ICD-10-CM | POA: Diagnosis not present

## 2019-08-07 DIAGNOSIS — I87313 Chronic venous hypertension (idiopathic) with ulcer of bilateral lower extremity: Secondary | ICD-10-CM | POA: Diagnosis not present

## 2019-08-07 DIAGNOSIS — L97819 Non-pressure chronic ulcer of other part of right lower leg with unspecified severity: Secondary | ICD-10-CM | POA: Diagnosis not present

## 2019-08-07 DIAGNOSIS — L97829 Non-pressure chronic ulcer of other part of left lower leg with unspecified severity: Secondary | ICD-10-CM | POA: Diagnosis not present

## 2019-08-10 DIAGNOSIS — I87313 Chronic venous hypertension (idiopathic) with ulcer of bilateral lower extremity: Secondary | ICD-10-CM | POA: Diagnosis not present

## 2019-08-10 DIAGNOSIS — I130 Hypertensive heart and chronic kidney disease with heart failure and stage 1 through stage 4 chronic kidney disease, or unspecified chronic kidney disease: Secondary | ICD-10-CM | POA: Diagnosis not present

## 2019-08-10 DIAGNOSIS — I5033 Acute on chronic diastolic (congestive) heart failure: Secondary | ICD-10-CM | POA: Diagnosis not present

## 2019-08-10 DIAGNOSIS — N183 Chronic kidney disease, stage 3 (moderate): Secondary | ICD-10-CM | POA: Diagnosis not present

## 2019-08-10 DIAGNOSIS — L97819 Non-pressure chronic ulcer of other part of right lower leg with unspecified severity: Secondary | ICD-10-CM | POA: Diagnosis not present

## 2019-08-10 DIAGNOSIS — L97829 Non-pressure chronic ulcer of other part of left lower leg with unspecified severity: Secondary | ICD-10-CM | POA: Diagnosis not present

## 2019-08-11 ENCOUNTER — Telehealth: Payer: Self-pay | Admitting: Family Medicine

## 2019-08-11 NOTE — Telephone Encounter (Signed)
Called pt to reschedule appt. OK to overbook per Corene Cornea after 9:20 AM if needed

## 2019-08-14 ENCOUNTER — Ambulatory Visit: Payer: Medicare Other | Admitting: Family

## 2019-08-14 ENCOUNTER — Telehealth: Payer: Self-pay

## 2019-08-14 ENCOUNTER — Ambulatory Visit: Payer: Medicare Other | Admitting: Family Medicine

## 2019-08-14 NOTE — Telephone Encounter (Signed)
I tried to call patient to see if she will make it to her appt today but patient did not answer, lvm.

## 2019-08-14 NOTE — Telephone Encounter (Signed)
LVM for pt to reschedule appt for today 08/14/2019. I cancelled her appt so that she does not receive a No Show fee since her appt was suppose to be rescheduled

## 2019-08-17 DIAGNOSIS — I87313 Chronic venous hypertension (idiopathic) with ulcer of bilateral lower extremity: Secondary | ICD-10-CM | POA: Diagnosis not present

## 2019-08-17 DIAGNOSIS — L97829 Non-pressure chronic ulcer of other part of left lower leg with unspecified severity: Secondary | ICD-10-CM | POA: Diagnosis not present

## 2019-08-17 DIAGNOSIS — I130 Hypertensive heart and chronic kidney disease with heart failure and stage 1 through stage 4 chronic kidney disease, or unspecified chronic kidney disease: Secondary | ICD-10-CM | POA: Diagnosis not present

## 2019-08-17 DIAGNOSIS — I5033 Acute on chronic diastolic (congestive) heart failure: Secondary | ICD-10-CM | POA: Diagnosis not present

## 2019-08-17 DIAGNOSIS — N183 Chronic kidney disease, stage 3 (moderate): Secondary | ICD-10-CM | POA: Diagnosis not present

## 2019-08-17 DIAGNOSIS — L97819 Non-pressure chronic ulcer of other part of right lower leg with unspecified severity: Secondary | ICD-10-CM | POA: Diagnosis not present

## 2019-08-19 ENCOUNTER — Ambulatory Visit: Payer: Medicare Other | Admitting: Family

## 2019-08-19 ENCOUNTER — Encounter (HOSPITAL_COMMUNITY): Payer: Self-pay

## 2019-08-19 ENCOUNTER — Other Ambulatory Visit: Payer: Self-pay

## 2019-08-19 ENCOUNTER — Inpatient Hospital Stay (HOSPITAL_COMMUNITY)
Admission: EM | Admit: 2019-08-19 | Discharge: 2019-08-27 | DRG: 574 | Disposition: A | Discharge status: Home Health | Payer: Medicare Other | Attending: Internal Medicine | Admitting: Internal Medicine

## 2019-08-19 ENCOUNTER — Telehealth: Payer: Self-pay

## 2019-08-19 ENCOUNTER — Inpatient Hospital Stay (HOSPITAL_COMMUNITY): Payer: Medicare Other

## 2019-08-19 DIAGNOSIS — L98493 Non-pressure chronic ulcer of skin of other sites with necrosis of muscle: Secondary | ICD-10-CM | POA: Diagnosis not present

## 2019-08-19 DIAGNOSIS — E440 Moderate protein-calorie malnutrition: Secondary | ICD-10-CM

## 2019-08-19 DIAGNOSIS — I83019 Varicose veins of right lower extremity with ulcer of unspecified site: Secondary | ICD-10-CM | POA: Diagnosis present

## 2019-08-19 DIAGNOSIS — N183 Chronic kidney disease, stage 3 unspecified: Secondary | ICD-10-CM | POA: Diagnosis present

## 2019-08-19 DIAGNOSIS — D638 Anemia in other chronic diseases classified elsewhere: Secondary | ICD-10-CM | POA: Diagnosis present

## 2019-08-19 DIAGNOSIS — N189 Chronic kidney disease, unspecified: Secondary | ICD-10-CM | POA: Diagnosis not present

## 2019-08-19 DIAGNOSIS — Z888 Allergy status to other drugs, medicaments and biological substances status: Secondary | ICD-10-CM

## 2019-08-19 DIAGNOSIS — Z7982 Long term (current) use of aspirin: Secondary | ICD-10-CM

## 2019-08-19 DIAGNOSIS — I96 Gangrene, not elsewhere classified: Secondary | ICD-10-CM | POA: Diagnosis present

## 2019-08-19 DIAGNOSIS — I872 Venous insufficiency (chronic) (peripheral): Secondary | ICD-10-CM | POA: Diagnosis not present

## 2019-08-19 DIAGNOSIS — Z833 Family history of diabetes mellitus: Secondary | ICD-10-CM

## 2019-08-19 DIAGNOSIS — Z8249 Family history of ischemic heart disease and other diseases of the circulatory system: Secondary | ICD-10-CM

## 2019-08-19 DIAGNOSIS — T86821 Skin graft (allograft) (autograft) failure: Secondary | ICD-10-CM | POA: Diagnosis not present

## 2019-08-19 DIAGNOSIS — S81802A Unspecified open wound, left lower leg, initial encounter: Secondary | ICD-10-CM | POA: Diagnosis not present

## 2019-08-19 DIAGNOSIS — I340 Nonrheumatic mitral (valve) insufficiency: Secondary | ICD-10-CM | POA: Diagnosis not present

## 2019-08-19 DIAGNOSIS — I83029 Varicose veins of left lower extremity with ulcer of unspecified site: Secondary | ICD-10-CM | POA: Diagnosis present

## 2019-08-19 DIAGNOSIS — I252 Old myocardial infarction: Secondary | ICD-10-CM

## 2019-08-19 DIAGNOSIS — I428 Other cardiomyopathies: Secondary | ICD-10-CM | POA: Diagnosis present

## 2019-08-19 DIAGNOSIS — E785 Hyperlipidemia, unspecified: Secondary | ICD-10-CM | POA: Diagnosis present

## 2019-08-19 DIAGNOSIS — J45909 Unspecified asthma, uncomplicated: Secondary | ICD-10-CM | POA: Diagnosis not present

## 2019-08-19 DIAGNOSIS — L97929 Non-pressure chronic ulcer of unspecified part of left lower leg with unspecified severity: Secondary | ICD-10-CM | POA: Diagnosis present

## 2019-08-19 DIAGNOSIS — Z6841 Body Mass Index (BMI) 40.0 and over, adult: Secondary | ICD-10-CM | POA: Diagnosis not present

## 2019-08-19 DIAGNOSIS — Y832 Surgical operation with anastomosis, bypass or graft as the cause of abnormal reaction of the patient, or of later complication, without mention of misadventure at the time of the procedure: Secondary | ICD-10-CM | POA: Diagnosis present

## 2019-08-19 DIAGNOSIS — S81809A Unspecified open wound, unspecified lower leg, initial encounter: Secondary | ICD-10-CM

## 2019-08-19 DIAGNOSIS — M1990 Unspecified osteoarthritis, unspecified site: Secondary | ICD-10-CM | POA: Diagnosis not present

## 2019-08-19 DIAGNOSIS — I10 Essential (primary) hypertension: Secondary | ICD-10-CM | POA: Diagnosis not present

## 2019-08-19 DIAGNOSIS — L03116 Cellulitis of left lower limb: Secondary | ICD-10-CM

## 2019-08-19 DIAGNOSIS — L97919 Non-pressure chronic ulcer of unspecified part of right lower leg with unspecified severity: Secondary | ICD-10-CM | POA: Diagnosis not present

## 2019-08-19 DIAGNOSIS — Z79891 Long term (current) use of opiate analgesic: Secondary | ICD-10-CM

## 2019-08-19 DIAGNOSIS — L03115 Cellulitis of right lower limb: Secondary | ICD-10-CM | POA: Diagnosis not present

## 2019-08-19 DIAGNOSIS — D62 Acute posthemorrhagic anemia: Secondary | ICD-10-CM | POA: Diagnosis not present

## 2019-08-19 DIAGNOSIS — L97819 Non-pressure chronic ulcer of other part of right lower leg with unspecified severity: Secondary | ICD-10-CM | POA: Diagnosis not present

## 2019-08-19 DIAGNOSIS — F329 Major depressive disorder, single episode, unspecified: Secondary | ICD-10-CM | POA: Diagnosis present

## 2019-08-19 DIAGNOSIS — Z95810 Presence of automatic (implantable) cardiac defibrillator: Secondary | ICD-10-CM

## 2019-08-19 DIAGNOSIS — I130 Hypertensive heart and chronic kidney disease with heart failure and stage 1 through stage 4 chronic kidney disease, or unspecified chronic kidney disease: Secondary | ICD-10-CM | POA: Diagnosis present

## 2019-08-19 DIAGNOSIS — S81809D Unspecified open wound, unspecified lower leg, subsequent encounter: Secondary | ICD-10-CM | POA: Diagnosis not present

## 2019-08-19 DIAGNOSIS — L98492 Non-pressure chronic ulcer of skin of other sites with fat layer exposed: Secondary | ICD-10-CM | POA: Diagnosis not present

## 2019-08-19 DIAGNOSIS — D631 Anemia in chronic kidney disease: Secondary | ICD-10-CM | POA: Diagnosis present

## 2019-08-19 DIAGNOSIS — I5032 Chronic diastolic (congestive) heart failure: Secondary | ICD-10-CM | POA: Diagnosis not present

## 2019-08-19 DIAGNOSIS — L98499 Non-pressure chronic ulcer of skin of other sites with unspecified severity: Secondary | ICD-10-CM | POA: Diagnosis present

## 2019-08-19 DIAGNOSIS — Z945 Skin transplant status: Secondary | ICD-10-CM

## 2019-08-19 DIAGNOSIS — L97829 Non-pressure chronic ulcer of other part of left lower leg with unspecified severity: Secondary | ICD-10-CM | POA: Diagnosis not present

## 2019-08-19 DIAGNOSIS — Z823 Family history of stroke: Secondary | ICD-10-CM

## 2019-08-19 DIAGNOSIS — Z20828 Contact with and (suspected) exposure to other viral communicable diseases: Secondary | ICD-10-CM | POA: Diagnosis present

## 2019-08-19 DIAGNOSIS — I87313 Chronic venous hypertension (idiopathic) with ulcer of bilateral lower extremity: Secondary | ICD-10-CM | POA: Diagnosis not present

## 2019-08-19 DIAGNOSIS — R600 Localized edema: Secondary | ICD-10-CM | POA: Diagnosis not present

## 2019-08-19 DIAGNOSIS — R52 Pain, unspecified: Secondary | ICD-10-CM | POA: Diagnosis not present

## 2019-08-19 DIAGNOSIS — I878 Other specified disorders of veins: Secondary | ICD-10-CM | POA: Diagnosis present

## 2019-08-19 DIAGNOSIS — Z8619 Personal history of other infectious and parasitic diseases: Secondary | ICD-10-CM | POA: Diagnosis not present

## 2019-08-19 DIAGNOSIS — R7303 Prediabetes: Secondary | ICD-10-CM | POA: Diagnosis present

## 2019-08-19 DIAGNOSIS — S81801A Unspecified open wound, right lower leg, initial encounter: Secondary | ICD-10-CM | POA: Diagnosis not present

## 2019-08-19 DIAGNOSIS — N179 Acute kidney failure, unspecified: Secondary | ICD-10-CM

## 2019-08-19 DIAGNOSIS — D869 Sarcoidosis, unspecified: Secondary | ICD-10-CM | POA: Diagnosis present

## 2019-08-19 DIAGNOSIS — Z03818 Encounter for observation for suspected exposure to other biological agents ruled out: Secondary | ICD-10-CM | POA: Diagnosis not present

## 2019-08-19 DIAGNOSIS — L089 Local infection of the skin and subcutaneous tissue, unspecified: Secondary | ICD-10-CM | POA: Diagnosis not present

## 2019-08-19 DIAGNOSIS — R5381 Other malaise: Secondary | ICD-10-CM | POA: Diagnosis not present

## 2019-08-19 DIAGNOSIS — M79605 Pain in left leg: Secondary | ICD-10-CM | POA: Diagnosis not present

## 2019-08-19 DIAGNOSIS — Z79899 Other long term (current) drug therapy: Secondary | ICD-10-CM

## 2019-08-19 DIAGNOSIS — I440 Atrioventricular block, first degree: Secondary | ICD-10-CM | POA: Diagnosis present

## 2019-08-19 DIAGNOSIS — I5033 Acute on chronic diastolic (congestive) heart failure: Secondary | ICD-10-CM | POA: Diagnosis not present

## 2019-08-19 LAB — COMPREHENSIVE METABOLIC PANEL
ALT: 13 U/L (ref 0–44)
AST: 12 U/L — ABNORMAL LOW (ref 15–41)
Albumin: 2.9 g/dL — ABNORMAL LOW (ref 3.5–5.0)
Alkaline Phosphatase: 88 U/L (ref 38–126)
Anion gap: 15 (ref 5–15)
BUN: 54 mg/dL — ABNORMAL HIGH (ref 6–20)
CO2: 22 mmol/L (ref 22–32)
Calcium: 9.4 mg/dL (ref 8.9–10.3)
Chloride: 94 mmol/L — ABNORMAL LOW (ref 98–111)
Creatinine, Ser: 3.04 mg/dL — ABNORMAL HIGH (ref 0.44–1.00)
GFR calc Af Amer: 19 mL/min — ABNORMAL LOW (ref >60)
GFR calc non Af Amer: 16 mL/min — ABNORMAL LOW (ref >60)
Glucose, Bld: 120 mg/dL — ABNORMAL HIGH (ref 70–99)
Potassium: 3.8 mmol/L (ref 3.5–5.1)
Sodium: 131 mmol/L — ABNORMAL LOW (ref 135–145)
Total Bilirubin: 1.2 mg/dL (ref 0.3–1.2)
Total Protein: 10.3 g/dL — ABNORMAL HIGH (ref 6.5–8.1)

## 2019-08-19 LAB — CBC WITH DIFFERENTIAL/PLATELET
Abs Immature Granulocytes: 0.09 10*3/uL — ABNORMAL HIGH (ref 0.00–0.07)
Basophils Absolute: 0.1 10*3/uL (ref 0.0–0.1)
Basophils Relative: 0 %
Eosinophils Absolute: 0.1 10*3/uL (ref 0.0–0.5)
Eosinophils Relative: 1 %
HCT: 35.7 % — ABNORMAL LOW (ref 36.0–46.0)
Hemoglobin: 10.9 g/dL — ABNORMAL LOW (ref 12.0–15.0)
Immature Granulocytes: 1 %
Lymphocytes Relative: 10 %
Lymphs Abs: 1.6 10*3/uL (ref 0.7–4.0)
MCH: 28.2 pg (ref 26.0–34.0)
MCHC: 30.5 g/dL (ref 30.0–36.0)
MCV: 92.5 fL (ref 80.0–100.0)
Monocytes Absolute: 1.2 10*3/uL — ABNORMAL HIGH (ref 0.1–1.0)
Monocytes Relative: 7 %
Neutro Abs: 13.2 10*3/uL — ABNORMAL HIGH (ref 1.7–7.7)
Neutrophils Relative %: 81 %
Platelets: 375 10*3/uL (ref 150–400)
RBC: 3.86 MIL/uL — ABNORMAL LOW (ref 3.87–5.11)
RDW: 15 % (ref 11.5–15.5)
WBC: 16.1 10*3/uL — ABNORMAL HIGH (ref 4.0–10.5)
nRBC: 0 % (ref 0.0–0.2)

## 2019-08-19 LAB — URINALYSIS, ROUTINE W REFLEX MICROSCOPIC
Bilirubin Urine: NEGATIVE
Glucose, UA: NEGATIVE mg/dL
Hgb urine dipstick: NEGATIVE
Ketones, ur: NEGATIVE mg/dL
Leukocytes,Ua: NEGATIVE
Nitrite: NEGATIVE
Protein, ur: NEGATIVE mg/dL
Specific Gravity, Urine: 1.014 (ref 1.005–1.030)
pH: 5 (ref 5.0–8.0)

## 2019-08-19 LAB — CREATININE, URINE, RANDOM: Creatinine, Urine: 196.79 mg/dL

## 2019-08-19 LAB — SODIUM, URINE, RANDOM: Sodium, Ur: 21 mmol/L

## 2019-08-19 LAB — SARS CORONAVIRUS 2 (TAT 6-24 HRS): SARS Coronavirus 2: NEGATIVE

## 2019-08-19 LAB — LACTIC ACID, PLASMA: Lactic Acid, Venous: 1.7 mmol/L (ref 0.5–1.9)

## 2019-08-19 MED ORDER — MORPHINE SULFATE (PF) 2 MG/ML IV SOLN
1.0000 mg | INTRAVENOUS | Status: DC | PRN
  Administered 2019-08-19 – 2019-08-21 (×7): 2 mg via INTRAVENOUS
  Administered 2019-08-22 – 2019-08-23 (×2): 1 mg via INTRAVENOUS
  Administered 2019-08-24: 2 mg via INTRAVENOUS
  Filled 2019-08-19 (×10): qty 1

## 2019-08-19 MED ORDER — SODIUM CHLORIDE 0.9% FLUSH
3.0000 mL | Freq: Once | INTRAVENOUS | Status: AC
  Administered 2019-08-19: 3 mL via INTRAVENOUS

## 2019-08-19 MED ORDER — PIPERACILLIN-TAZOBACTAM 3.375 G IVPB 30 MIN
3.3750 g | Freq: Once | INTRAVENOUS | Status: AC
  Administered 2019-08-19: 3.375 g via INTRAVENOUS
  Filled 2019-08-19: qty 50

## 2019-08-19 MED ORDER — VANCOMYCIN HCL 10 G IV SOLR
2000.0000 mg | Freq: Once | INTRAVENOUS | Status: AC
  Administered 2019-08-19: 12:00:00 2000 mg via INTRAVENOUS
  Filled 2019-08-19: qty 2000

## 2019-08-19 MED ORDER — PIPERACILLIN-TAZOBACTAM 3.375 G IVPB
3.3750 g | Freq: Three times a day (TID) | INTRAVENOUS | Status: DC
  Administered 2019-08-19 – 2019-08-27 (×22): 3.375 g via INTRAVENOUS
  Filled 2019-08-19 (×22): qty 50

## 2019-08-19 MED ORDER — VANCOMYCIN VARIABLE DOSE PER UNSTABLE RENAL FUNCTION (PHARMACIST DOSING): Status: DC

## 2019-08-19 MED ORDER — SODIUM CHLORIDE 0.9 % IV SOLN: INTRAVENOUS | Status: DC

## 2019-08-19 NOTE — ED Notes (Signed)
Pt cannot use restroom at this time, aware urine specimen is needed.  

## 2019-08-19 NOTE — Progress Notes (Signed)
Pharmacy Antibiotic Note  Kari [name] is a 58 y.o. female admitted on 08/19/2019 with wound infection.  Pharmacy has been consulted for vancomycin and piperacillin/tazobactam dosing.  Pt has PMH significant for CKD stage II, chronic bilateral lower extremity ulcers with recent I&D with skin grafts on 07/17/19. Pt presenting with worsening lower extremity wounds with increased drainage.   Today, 08/19/19  WBC 16.1  SCr 3, AKI, CrCl ~ 29 mL/min. Baseline SCr seems to be ~1.5  Plan:  Piperacillin/tazobactam 3.375 g IV q8h EI  Vancomycin 2000 mg LD  Given AKI, will recheck SCr with AM labs tomorrow to guide further dosing  SCr daily  Follow cultures  Check vancomycin levels once at steady state if indicated  Height: 5\' 7"  (170.2 cm) Weight: 299 lb (135.6 kg) IBW/kg (Calculated) : 61.6  Temp (24hrs), Avg:97.8 F (36.6 C), Min:97.8 F (36.6 C), Max:97.8 F (36.6 C)  Recent Labs  Lab 08/19/19 1112 08/19/19 1115  WBC 16.1*  --   CREATININE 3.04*  --   LATICACIDVEN  --  1.7    Estimated Creatinine Clearance: 29.4 mL/min (A) (by C-G formula based on SCr of 3.04 mg/dL (H)).    Allergies  Allergen Reactions  . Tramadol Other (See Comments)    hallucination    Antimicrobials this admission: Vancomycin 8/26 >>  Piperacillin/tazobactam 8/26 >>   Dose adjustments this admission:  Microbiology results: 8/26 BCx: Pending 8/26 SARS-2: Sent  Thank you for allowing pharmacy to be a part of this patient's care.  Lenis Noon, PharmD 08/19/2019 4:40 PM

## 2019-08-19 NOTE — ED Notes (Signed)
Report given to 6N

## 2019-08-19 NOTE — ED Triage Notes (Signed)
Pt arrived via [facility] from home.Per EMS AO X4 and ambulatory, No IV, pain 3/10  CC: Bilateral leg wounds. RLL bandaged. LLL injury possible infection.

## 2019-08-19 NOTE — ED Notes (Signed)
Spoke to patients sister and updated on plan of care.   Patient gave permission to speak with sister.

## 2019-08-19 NOTE — ED Notes (Signed)
Attempted to call 6N for pt hand off RN not available at this time. 6n will call this RN

## 2019-08-19 NOTE — ED Notes (Addendum)
[place] at bedside. Patient aware we need urine sample. Pure wick in place.

## 2019-08-19 NOTE — Progress Notes (Signed)
A consult was received from an ED provider for vancomycin per pharmacy dosing.  The patient's profile has been reviewed for ht/wt/allergies/indication/available labs.    A one time order has been placed for vancomycin 2000 mg IV once.    Further antibiotics/pharmacy consults should be ordered by admitting physician if indicated.                       Thank you, Lenis Noon, PharmD 08/19/2019  12:08 PM

## 2019-08-19 NOTE — ED Notes (Signed)
Patient given meal tray.

## 2019-08-19 NOTE — ED Notes (Signed)
Patient transported to X-ray 

## 2019-08-19 NOTE — ED Notes (Signed)
Report given to PTAR 

## 2019-08-19 NOTE — ED Notes (Signed)
ED TO INPATIENT HANDOFF REPORT  Name/Age/Gender Kari [name] 58 y.o. female  Code Status Code Status History    Date Active Date Inactive Code Status Order ID Comments User Context   07/20/2019 1406 07/25/2019 1513 Full Code [id number]  Flora Lipps Inpatient   07/15/2019 1234 07/20/2019 1359 Full Code [id number]  [name], MD Inpatient   12/26/2018 0829 01/10/2019 0235 Full Code [id number]  Phillips Grout, MD Inpatient   02/21/2018 0344 02/26/2018 1913 Full Code [id number]  Norval Morton, MD ED   07/20/2017 0735 07/24/2017 1739 Full Code [id number]  Oswald Hillock, MD Inpatient   06/26/2017 1959 07/06/2017 1639 Full Code [id number]  Arnell Asal, NP Inpatient   04/02/2017 0231 04/06/2017 1939 Full Code [id number]  Oswald Hillock, MD Inpatient   12/18/2016 0021 12/19/2016 1952 Full Code [id number]  Tacey Ruiz, MD Inpatient   12/18/2016 0021 12/18/2016 0021 Full Code [id number]  Tacey Ruiz, MD Inpatient   03/12/2014 1841 03/15/2014 1418 Full Code [id number]  Doree Albee, MD ED   10/08/2013 0445 10/14/2013 1706 Full Code [id number]  Orvan Falconer, MD Inpatient   09/02/2012 1358 09/04/2012 1630 Full Code [id number]  Bynum Bellows, MD Inpatient   09/02/2012 0012 09/02/2012 1358 Full Code [id number]  Johnna Acosta, MD ED   06/21/2012 0748 06/25/2012 1507 Full Code [id number]  Theodis Blaze, MD ED   Advance Care Planning Activity      Home/SNF/Other Home  Chief Complaint leg wound  Level of Care/Admitting Diagnosis ED Disposition    ED Disposition Condition [facility] Area: [place] [[id number]]  Level of Care: Med-Surg [16]  Covid Evaluation: Asymptomatic Screening Protocol (No Symptoms)  Diagnosis: Non-healing ulcer ([place]) [767209]  Admitting Physician: Hosie Poisson [[id number]]  Attending Physician: Hosie Poisson [[id number]]  Estimated length of stay: past midnight tomorrow  Certification:: I certify this patient will need inpatient services for at least 2  midnights  PT Class (Do Not Modify): Inpatient [101]  PT Acc Code (Do Not Modify): Private [1]       Medical History Past Medical History:  Diagnosis Date  . Arthritis   . Asthma   . CHF (congestive heart failure) ([place]) 03/12/2014   a. EF 40-45% by echo in 06/2017 with normal cors by cath. ICD placed following VT arrest  . Depression   . Headache(784.0)   . History of blood transfusion   . Hyperlipidemia   . Hypertension   . Lung nodules   . Myocardial infarction ([place]) 06/26/2014  . Renal disorder    followed by [facility]  . Sarcoidosis   . Ulcer    recurring, from chronic venous insufficiency    Allergies Allergies  Allergen Reactions  . Tramadol Other (See Comments)    hallucination    IV Location/Drains/Wounds Patient Lines/Drains/Airways Status   Active Line/Drains/Airways    Name:   Placement date:   Placement time:   Site:   Days:   Peripheral IV 01/08/19 Anterior;Proximal;Right Forearm   01/08/19    1450    Forearm   223   Peripheral IV 07/19/19 Right;Lateral Forearm   07/19/19    2012    Forearm   31   Peripheral IV 08/19/19 Right Antecubital   08/19/19    1121    Antecubital   less than 1   Incision (Closed) 01/07/19 Leg Left   01/07/19    1247     224   Incision (  Closed) 07/15/19 Leg Right   07/15/19    1108     35   Incision (Closed) 07/15/19 Leg Left   07/15/19    1108     35   Incision (Closed) 07/17/19 Leg Left   07/17/19    1019     33   Pressure Injury 04/02/17 Stage II -  Partial thickness loss of dermis presenting as a shallow open ulcer with a red, pink wound bed without slough.    04/02/17    0251     869   Pressure Injury 04/02/17 Stage I -  Intact skin with non-blanchable redness of a localized area usually over a bony prominence. pink/blanchable   04/02/17    0255     869   Wound / Incision (Open or Dehisced) 04/02/17 Venous stasis ulcer Left 13cmx10cm yellow, black ulcer   04/02/17    0210    -   869   Wound / Incision (Open or Dehisced)  06/26/17 Venous stasis ulcer Leg Right 12 by 13 cm   06/26/17    2300    Leg   784   Wound / Incision (Open or Dehisced) 02/21/18 Venous stasis ulcer Leg Right   02/21/18    0445    Leg   544          Labs/Imaging Results for orders placed or performed during the hospital encounter of 08/19/19 (from the past 48 hour(s))  Comprehensive metabolic panel     Status: Abnormal   Collection Time: 08/19/19 11:12 AM  Result Value Ref Range   Sodium 131 (L) 135 - 145 mmol/L   Potassium 3.8 3.5 - 5.1 mmol/L   Chloride 94 (L) 98 - 111 mmol/L   CO2 22 22 - 32 mmol/L   Glucose, Bld 120 (H) 70 - 99 mg/dL   BUN 54 (H) 6 - 20 mg/dL   Creatinine, Ser 3.04 (H) 0.44 - 1.00 mg/dL   Calcium 9.4 8.9 - 10.3 mg/dL   Total Protein 10.3 (H) 6.5 - 8.1 g/dL   Albumin 2.9 (L) 3.5 - 5.0 g/dL   AST 12 (L) 15 - 41 U/L   ALT 13 0 - 44 U/L   Alkaline Phosphatase 88 38 - 126 U/L   Total Bilirubin 1.2 0.3 - 1.2 mg/dL   GFR calc non Af Amer 16 (L) >60 mL/min   GFR calc Af Amer 19 (L) >60 mL/min   Anion gap 15 5 - 15    Comment: Performed at [facility], [place] [address] [place], [place] [address]  CBC with Differential     Status: Abnormal   Collection Time: 08/19/19 11:12 AM  Result Value Ref Range   WBC 16.1 (H) 4.0 - 10.5 K/uL   RBC 3.86 (L) 3.87 - 5.11 MIL/uL   Hemoglobin 10.9 (L) 12.0 - 15.0 g/dL   HCT 35.7 (L) 36.0 - 46.0 %   MCV 92.5 80.0 - 100.0 fL   MCH 28.2 26.0 - 34.0 pg   MCHC 30.5 30.0 - 36.0 g/dL   RDW 15.0 11.5 - 15.5 %   Platelets 375 150 - 400 K/uL   nRBC 0.0 0.0 - 0.2 %   Neutrophils Relative % 81 %   Neutro Abs 13.2 (H) 1.7 - 7.7 K/uL   Lymphocytes Relative 10 %   Lymphs Abs 1.6 0.7 - 4.0 K/uL   Monocytes Relative 7 %   Monocytes Absolute 1.2 (H) 0.1 - 1.0 K/uL   Eosinophils  Relative 1 %   Eosinophils Absolute 0.1 0.0 - 0.5 K/uL   Basophils Relative 0 %   Basophils Absolute 0.1 0.0 - 0.1 K/uL   Immature Granulocytes 1 %   Abs Immature Granulocytes 0.09 (H) 0.00 -  0.07 K/uL    Comment: Performed at [facility], [place] [address] [place], [place] [address]  Lactic acid, plasma     Status: None   Collection Time: 08/19/19 11:15 AM  Result Value Ref Range   Lactic Acid, Venous 1.7 0.5 - 1.9 mmol/L    Comment: Performed at [facility], [place] [address] [place], [place] [address]  Culture, blood (routine x 2)     Status: None (Preliminary result)   Collection Time: 08/19/19 11:27 AM   Specimen: BLOOD  Result Value Ref Range   Specimen Description      BLOOD RIGHT ANTECUBITAL Performed at [facility], [place] [address], [place], [place] [address]    Special Requests      BOTTLES DRAWN AEROBIC AND ANAEROBIC Blood Culture adequate volume Performed at [facility] [address], [place], [place] [address]    Culture PENDING    Report Status PENDING   Culture, blood (routine x 2)     Status: None (Preliminary result)   Collection Time: 08/19/19 11:32 AM   Specimen: BLOOD  Result Value Ref Range   Specimen Description      BLOOD LEFT ANTECUBITAL Performed at [facility], [place] [address], [place], [place] [address]    Special Requests      BOTTLES DRAWN AEROBIC AND ANAEROBIC Blood Culture adequate volume Performed at [facility] [address] [place], [place] [address]    Culture PENDING    Report Status PENDING    No results found.  Pending Labs Unresulted Labs (From admission, onward)    Start     Ordered   08/19/19 1502  Sodium, urine, random  Once,   STAT     08/19/19 1501   08/19/19 1502  Creatinine, urine, random  Once,   STAT     08/19/19 1501   08/19/19 1128  SARS CORONAVIRUS 2 (TAT 6-12 HRS) Nasal Swab Aptima Multi Swab  (Asymptomatic/Tier 2 Patients Labs)  Once,   STAT    Question Answer Comment  Is this test for diagnosis or screening Screening   Symptomatic for COVID-19 as defined by CDC No   Hospitalized for COVID-19 No   Admitted to  ICU for COVID-19 No   Previously tested for COVID-19 Yes   Resident in a congregate (group) care setting No   Employed in healthcare setting No   Pregnant No      08/19/19 1127   08/19/19 1112  Lactic acid, plasma  Now then every 2 hours,   STAT     08/19/19 1112   08/19/19 1112  Urinalysis, Routine w reflex microscopic  ONCE - STAT,   STAT     08/19/19 1112   Signed and Held  HIV antibody (Routine Testing)  Once,   R     Signed and Held   Signed and Held  Creatinine, serum  (enoxaparin (LOVENOX)    CrCl < 30 ml/min)  Weekly,   R    Comments: while on enoxaparin therapy.    Signed and Held   Signed and Held  Hemoglobin A1c  Tomorrow morning,   R     Signed and Held   Signed and  Held  Comprehensive metabolic panel  Tomorrow morning,   R     Signed and Held   Signed and Held  CBC  Tomorrow morning,   R     Signed and Held          Vitals/Pain Today's Vitals   08/19/19 1334 08/19/19 1400 08/19/19 1416 08/19/19 1430  BP: 116/68 118/63 118/63 113/60  Pulse: 72 70 66 66  Resp: 16 (!) 23 18 (!) 21  Temp:      TempSrc:      SpO2: 98% 98% 97% 97%  Weight:      Height:      PainSc:        Isolation Precautions No active isolations  Medications Medications  morphine 2 MG/ML injection 1-2 mg (has no administration in time range)  0.9 %  sodium chloride infusion (has no administration in time range)  piperacillin-tazobactam (ZOSYN) IVPB 3.375 g (has no administration in time range)  sodium chloride flush (NS) 0.9 % injection 3 mL (3 mLs Intravenous Given 08/19/19 1126)  vancomycin (VANCOCIN) 2,000 mg in sodium chloride 0.9 % 500 mL IVPB (0 mg Intravenous Stopped 08/19/19 1550)    Mobility non-ambulatory

## 2019-08-19 NOTE — Telephone Encounter (Signed)
Noted  

## 2019-08-19 NOTE — Telephone Encounter (Signed)
FYI Patients sister Vaughan Basta called to let you know that patient would not make it for her appointment today because she was being taken by EMS to hospital due to her leg/foot getting worse. She also stated patients urine appeared red in color.

## 2019-08-19 NOTE — ED Provider Notes (Signed)
[facility] DEPT Provider Note   CSN: [id number] Arrival date & time: 08/19/19  1053     History   Chief Complaint Chief Complaint  Patient presents with  . Leg Injury    HPI Kari [name] is a 58 y.o. female.     The history is provided by the patient and medical records. No language interpreter was used.   Kari [name] is a 58 y.o. female with an extensive past medical history as listed below including recent irrigation and debridement with skin graft by Dr. Sharol Given on 7/24 who presents to the Emergency Department complaining of bilateral lower extremity leg wounds which have been increasingly painful over the last 3 to 4 days.  Associated with drainage and odor.  Denies any fevers.  Was seen by orthopedics on 8/04, but was unable to get to the next follow-up appointment.    Past Medical History:  Diagnosis Date  . Arthritis   . Asthma   . CHF (congestive heart failure) ([place]) 03/12/2014   a. EF 40-45% by echo in 06/2017 with normal cors by cath. ICD placed following VT arrest  . Depression   . Headache(784.0)   . History of blood transfusion   . Hyperlipidemia   . Hypertension   . Lung nodules   . Myocardial infarction ([place]) 06/26/2014  . Renal disorder    followed by [facility]  . Sarcoidosis   . Ulcer    recurring, from chronic venous insufficiency    Patient Active Problem List   Diagnosis Date Noted  . Non-healing ulcer ([place]) 08/19/2019  . Postoperative pain   . Labile blood pressure   . Nonischemic cardiomyopathy ([place])   . Physical debility 07/20/2019  . Idiopathic chronic venous hypertension of lower extremity with ulcer, bilateral ([place]) 07/15/2019  . H/O skin graft 05/13/2019  . CKD (chronic kidney disease), stage III ([place]) 01/13/2019  . Infected wound 12/26/2018  . Idiopathic chronic venous hypertension of both lower extremities with ulcer and inflammation ([place])   . History of fall 05/10/2018  . Hyperkalemia  02/21/2018  . Hypotension 02/21/2018  . Sepsis ([place]) 02/21/2018  . Encounter for central line placement   . Mitral regurgitation 10/30/2017  . Acute kidney injury ([place]) 07/21/2017  . Generalized weakness 07/20/2017  . Shock circulatory ([place])   . Acute respiratory failure with hypoxia ([place])   . Pulmonary edema   . Cardiac arrest ([place]) 06/26/2017  . Ventricular fibrillation ([place])   . Acute on chronic diastolic heart failure ([place])   . Abnormal ECG   . Cellulitis 04/02/2017  . AKI (acute kidney injury) ([place]) 12/17/2016  . Cellulitis and abscess of right lower extremity 12/17/2016  . Syncope and collapse 12/17/2016  . Peripheral edema 12/17/2016  . Hypokalemia 12/17/2016  . Anemia of chronic disease 12/17/2016  . Acute diastolic CHF (congestive heart failure) ([place]) 03/14/2014  . CHF (congestive heart failure) ([place]) 03/12/2014  . Sarcoidosis ([place]) 03/12/2014  . Severe sepsis ([place]) 10/17/2013  . ARF (acute renal failure) ([place]) 10/08/2013  . Cellulitis of multiple sites of lower extremity 10/08/2013  . Morbid obesity ([place]) 10/08/2013  . Volume depletion 10/08/2013  . Venous (peripheral) insufficiency 10/28/2012  . Mediastinal lymphadenopathy 10/16/2012  . Pulmonary nodules 10/16/2012  . 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 Chest pain 09/02/2012  . Asthma   . Hypertension   . Depression   . Venous stasis ulcers ([place]) 07/29/2012  . Varicose veins of lower extremities  with ulcer ([place]) 07/08/2012  . Venous ulcer of leg ([place]) 07/08/2012    Past Surgical History:  Procedure Laterality Date  . cataract surgery Left 07-2013  . I&D EXTREMITY Bilateral 12/31/2018   Procedure: DEBRIDEMENT BILATERAL LEGS, APPLY VAC X 2;  Surgeon: [name], MD;  Location: [facility];  Service: Orthopedics;  Laterality: Bilateral;  . I&D EXTREMITY Bilateral 01/02/2019   Procedure: REPEAT DEBRIDEMENT BILATERAL LEGS, APPLY VAC X 2;  Surgeon: [name],  MD;  Location: [facility];  Service: Orthopedics;  Laterality: Bilateral;  . I&D EXTREMITY Bilateral 07/15/2019   Procedure: IRRIGATION AND DEBRIDEMENT VENOUS STASIS INSUFFICIENCY ULCERATIONS BILATERAL LOWER EXTREMITIES;  Surgeon: [name], MD;  Location: [facility];  Service: Orthopedics;  Laterality: Bilateral;  . ICD IMPLANT N/A 07/05/2017   Procedure: ICD Implant;  Surgeon: Constance Haw, MD;  Location: [facility];  Service: Cardiovascular;  Laterality: N/A;  . LEFT HEART CATH AND CORONARY ANGIOGRAPHY N/A 07/03/2017   Procedure: Left Heart Cath and Coronary Angiography;  Surgeon: Belva Crome, MD;  Location: [facility];  Service: Cardiovascular;  Laterality: N/A;  . SKIN SPLIT GRAFT Bilateral 01/07/2019   Procedure: SPLIT THICKNESS SKIN GRAFT BILATERAL LEGS, APPLY VAC;  Surgeon: [name], MD;  Location: [facility];  Service: Orthopedics;  Laterality: Bilateral;  . SKIN SPLIT GRAFT Bilateral 07/17/2019   Procedure: REPEAT IRRIGATION AND DEBRIDEMENT BILATERAL LOWER EXTREMITIES, SKIN GRAFT;  Surgeon: [name], MD;  Location: [facility];  Service: Orthopedics;  Laterality: Bilateral;     OB History   No obstetric history on file.      Home Medications    Prior to Admission medications   Medication Sig Start Date End Date Taking? Authorizing Provider  acetaminophen (TYLENOL) 325 MG tablet Take 1-2 tablets (325-650 mg total) by mouth every 4 (four) hours as needed for mild pain. 07/22/19  Yes Love, Ivan Anchors, PA-C  albuterol (PROVENTIL HFA;VENTOLIN HFA) 108 (90 Base) MCG/ACT inhaler Inhale 2 puffs into the lungs every 6 (six) hours as needed for shortness of breath. 02/02/19  Yes Medina-Vargas, Monina C, NP  allopurinol (ZYLOPRIM) 100 MG tablet Take 2 tablets (200 mg total) by mouth daily. Patient taking differently: Take 100 mg by mouth 2 (two) times daily.  05/13/19  Yes Rutherford Guys, MD  aspirin EC 81 MG tablet Take 81 mg by mouth daily.   Yes [provider]   carvedilol (COREG) 6.25 MG tablet Take 1 tablet (6.25 mg total) by mouth 2 (two) times daily. 02/02/19  Yes Medina-Vargas, Monina C, NP  docusate sodium (COLACE) 100 MG capsule Take 1 capsule (100 mg total) by mouth 2 (two) times daily. 07/25/19  Yes Jamse Arn, MD  furosemide (LASIX) 40 MG tablet Take 1.5 tablets (60 mg total) by mouth daily. Take 1-1/2 tablets to = 60 mg Patient taking differently: Take 60 mg by mouth daily.  05/13/19  Yes Rutherford Guys, MD  oxyCODONE (OXY IR/ROXICODONE) 5 MG immediate release tablet Take 1 tablet (5 mg total) by mouth 2 (two) times daily as needed for severe pain. 07/25/19  Yes Jamse Arn, MD  potassium chloride (K-DUR) 10 MEQ tablet Take 30 mEq by mouth daily. 07/25/19  Yes [provider]  methocarbamol (ROBAXIN) 500 MG tablet Take 1 tablet (500 mg total) by mouth every 8 (eight) hours as needed for muscle spasms. Patient not taking: Reported on 08/19/2019 07/25/19   Jamse Arn, MD  potassium chloride (  KLOR-CON M15) 15 MEQ tablet Take 2 tablets (30 mEq total) by mouth daily. Patient not taking: Reported on 08/19/2019 07/26/19   Jamse Arn, MD    Family History Family History  Problem Relation Age of Onset  . Heart failure Mother   . Diabetes Mellitus II Mother   . Hypertension Mother   . Cancer Mother        unknown type  . Heart disease Father   . Stroke Father   . Diabetes Mellitus II Father     Social History Social History   Tobacco Use  . Smoking status: Never Smoker  . Smokeless tobacco: Never Used  Substance Use Topics  . Alcohol use: No  . Drug use: No     Allergies   Tramadol   Review of Systems Review of Systems  Skin: Positive for wound.  All other systems reviewed and are negative.    Physical Exam Updated Vital Signs BP 122/68   Pulse 70   Temp 97.8 F (36.6 C) (Oral)   Resp (!) 23   Ht 5\' 7"  (1.702 m)   Wt 135.6 kg   LMP 11/21/2011   SpO2 100%   BMI 46.83 kg/m   Physical Exam  Vitals signs and nursing note reviewed.  Constitutional:      General: She is not in acute distress.    Appearance: She is well-developed.  HENT:     Head: Normocephalic and atraumatic.  Neck:     Musculoskeletal: Neck supple.  Cardiovascular:     Rate and Rhythm: Normal rate and regular rhythm.     Heart sounds: Normal heart sounds. No murmur.  Pulmonary:     Effort: Pulmonary effort is normal. No respiratory distress.     Breath sounds: Normal breath sounds.  Abdominal:     General: There is no distension.     Palpations: Abdomen is soft.     Tenderness: There is no abdominal tenderness.  Skin:    General: Skin is warm and dry.     Comments: See image below of bilateral LE's.   Neurological:     Mental Status: She is alert and oriented to person, place, and time.          ED Treatments / Results  Labs (all labs ordered are listed, but only abnormal results are displayed) Labs Reviewed  COMPREHENSIVE METABOLIC PANEL - Abnormal; Notable for the following components:      Result Value   Sodium 131 (*)    Chloride 94 (*)    Glucose, Bld 120 (*)    BUN 54 (*)    Creatinine, Ser 3.04 (*)    Total Protein 10.3 (*)    Albumin 2.9 (*)    AST 12 (*)    GFR calc non Af Amer 16 (*)    GFR calc Af Amer 19 (*)    All other components within normal limits  CBC WITH DIFFERENTIAL/PLATELET - Abnormal; Notable for the following components:   WBC 16.1 (*)    RBC 3.86 (*)    Hemoglobin 10.9 (*)    HCT 35.7 (*)    Neutro Abs 13.2 (*)    Monocytes Absolute 1.2 (*)    Abs Immature Granulocytes 0.09 (*)    All other components within normal limits  CULTURE, BLOOD (ROUTINE X 2)  CULTURE, BLOOD (ROUTINE X 2)  SARS CORONAVIRUS 2 (TAT 6-12 HRS)  LACTIC ACID, PLASMA  LACTIC ACID, PLASMA  URINALYSIS, ROUTINE W REFLEX MICROSCOPIC  EKG None  Radiology No results found.  Procedures Procedures (including critical care time)  Medications Ordered in ED Medications   vancomycin (VANCOCIN) 2,000 mg in sodium chloride 0.9 % 500 mL IVPB (2,000 mg Intravenous New Bag/Given 08/19/19 1219)  morphine 2 MG/ML injection 1-2 mg (has no administration in time range)  sodium chloride flush (NS) 0.9 % injection 3 mL (3 mLs Intravenous Given 08/19/19 1126)     Initial Impression / Assessment and Plan / ED Course  I have reviewed the triage vital signs and the nursing notes.  Pertinent labs & imaging results that were available during my care of the patient were reviewed by me and considered in my medical decision making (see chart for details).       Kari [name] is a 58 y.o. female who presents to ED for bilateral lower extremity wounds s/p irrigation and debridement with skin graft placement on 7/24 by Dr. Sharol Given.  Concern for infection of said wounds on exam.  Does have a leukocytosis, but normal lactic.  Hemodynamically stable.  Doubt septic shock.  Did obtain blood cultures and patient was started on vancomycin.  Discussed with orthopedics who recommends admission to [facility] -likely to OR on Friday with Dr. Sharol Given.  Remainder of labs reviewed.  Notable for AKI with nearly doubling her creatinine.  Discussed with hospitalist who will admit.  Final Clinical Impressions(s) / ED Diagnoses   Final diagnoses:  Cellulitis of right lower extremity  Cellulitis of left lower extremity  AKI (acute kidney injury) [facility])    ED Discharge Orders    None       Ward, Ozella Almond, PA-C 08/19/19 1319    Quintella Reichert, MD 08/20/19 [phone]

## 2019-08-19 NOTE — H&P (Addendum)
History and Physical    Kari [name] TKW:[id number] DOB: [birth year] DOA: 08/19/2019  PCP: Rutherford Guys, MD  Patient coming from: Home.   I have personally briefly reviewed patient's old medical records in [place]  Chief Complaint: worsening lower extremity wounds.   HPI: Kari [name] is a 58 y.o. 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 Sharol Given and she was discharged to inpatient rehab presents today from home for worsening lower extremity wounds with increased drainage and foul smell and worsening pain.  Patient denies any fevers or chills, denies chest pain shortness of breath or cough, denies any nausea vomiting abdominal pain, dysuria, hematochezia or hematemesis. She is able to walk with assistance and pain is a limiting factor for ambulation.  ED Course: On arrival to ED she was found to be afebrile normotensive.  Labs revealed AKI with creatinine of 3.04, sodium of 131, glucose of 120, BUN of 54, lactic acid was 1.7, WBC count 16.1, hemoglobin of 10.9.  Blood cultures were drawn and are pending. EKG shows sinus rhythm with prolonged PR interval. Orthopedics consulted , Dr. Sharol Given requested to transfer the patient to [facility] for possible I&D on Friday.  She was referred to medical service/TRH for evaluation and management of chronic nonhealing lower extremity venous ulcers.  Review of Systems:  "All others reviewed and are negative,"   Past Medical History:  Diagnosis Date  . Arthritis   . Asthma   . CHF (congestive heart failure) ([place]) 03/12/2014   a. EF 40-45% by echo in 06/2017 with normal cors by cath. ICD placed following VT arrest  . Depression   . Headache(784.0)   . History of blood  transfusion   . Hyperlipidemia   . Hypertension   . Lung nodules   . Myocardial infarction ([place]) 06/26/2014  . Renal disorder    followed by [facility]  . Sarcoidosis   . Ulcer    recurring, from chronic venous insufficiency    Past Surgical History:  Procedure Laterality Date  . cataract surgery Left 07-2013  . I&D EXTREMITY Bilateral 12/31/2018   Procedure: DEBRIDEMENT BILATERAL LEGS, APPLY VAC X 2;  Surgeon: [name], MD;  Location: [facility];  Service: Orthopedics;  Laterality: Bilateral;  . I&D EXTREMITY Bilateral 01/02/2019   Procedure: REPEAT DEBRIDEMENT BILATERAL LEGS, APPLY VAC X 2;  Surgeon: [name], MD;  Location: [facility];  Service: Orthopedics;  Laterality: Bilateral;  . I&D EXTREMITY Bilateral 07/15/2019   Procedure: IRRIGATION AND DEBRIDEMENT VENOUS STASIS INSUFFICIENCY ULCERATIONS BILATERAL LOWER EXTREMITIES;  Surgeon: [name], MD;  Location: [place];  Service: Orthopedics;  Laterality: Bilateral;  . ICD IMPLANT N/A 07/05/2017   Procedure: ICD Implant;  Surgeon: Constance Haw, MD;  Location: [facility] [facility];  Service: Cardiovascular;  Laterality: N/A;  . LEFT HEART CATH AND CORONARY ANGIOGRAPHY N/A 07/03/2017   Procedure: Left Heart Cath and Coronary Angiography;  Surgeon: Belva Crome, MD;  Location: [place] [facility];  Service: Cardiovascular;  Laterality: N/A;  . SKIN SPLIT GRAFT Bilateral 01/07/2019   Procedure: SPLIT THICKNESS SKIN GRAFT BILATERAL LEGS, APPLY VAC;  Surgeon: [name], MD;  Location: [facility];  Service: Orthopedics;  Laterality: Bilateral;  . SKIN SPLIT GRAFT Bilateral 07/17/2019  Procedure: REPEAT IRRIGATION AND DEBRIDEMENT BILATERAL LOWER EXTREMITIES, SKIN GRAFT;  Surgeon: [name], MD;  Location: [place] [facility];  Service: Orthopedics;  Laterality: Bilateral;   Social history  reports that she has never smoked. She has never used smokeless tobacco. She reports that she does not drink alcohol or use drugs.  Allergies   Allergen Reactions  . Tramadol Other (See Comments)    hallucination    Family History  Problem Relation Age of Onset  . Heart failure Mother   . Diabetes Mellitus II Mother   . Hypertension Mother   . Cancer Mother        unknown type  . Heart disease Father   . Stroke Father   . Diabetes Mellitus II Father     Acceptable: Family history reviewed and not pertinent     Prior to Admission medications   Medication Sig Start Date End Date Taking? Authorizing Provider  acetaminophen (TYLENOL) 325 MG tablet Take 1-2 tablets (325-650 mg total) by mouth every 4 (four) hours as needed for mild pain. 07/22/19  Yes Love, Ivan Anchors, PA-C  albuterol (PROVENTIL HFA;VENTOLIN HFA) 108 (90 Base) MCG/ACT inhaler Inhale 2 puffs into the lungs every 6 (six) hours as needed for shortness of breath. 02/02/19  Yes Medina-Vargas, Monina C, NP  allopurinol (ZYLOPRIM) 100 MG tablet Take 2 tablets (200 mg total) by mouth daily. Patient taking differently: Take 100 mg by mouth 2 (two) times daily.  05/13/19  Yes Rutherford Guys, MD  aspirin EC 81 MG tablet Take 81 mg by mouth daily.   Yes [provider]  carvedilol (COREG) 6.25 MG tablet Take 1 tablet (6.25 mg total) by mouth 2 (two) times daily. 02/02/19  Yes Medina-Vargas, Monina C, NP  docusate sodium (COLACE) 100 MG capsule Take 1 capsule (100 mg total) by mouth 2 (two) times daily. 07/25/19  Yes Jamse Arn, MD  furosemide (LASIX) 40 MG tablet Take 1.5 tablets (60 mg total) by mouth daily. Take 1-1/2 tablets to = 60 mg Patient taking differently: Take 60 mg by mouth daily.  05/13/19  Yes Rutherford Guys, MD  oxyCODONE (OXY IR/ROXICODONE) 5 MG immediate release tablet Take 1 tablet (5 mg total) by mouth 2 (two) times daily as needed for severe pain. 07/25/19  Yes Jamse Arn, MD  potassium chloride (K-DUR) 10 MEQ tablet Take 30 mEq by mouth daily. 07/25/19  Yes [provider]  methocarbamol (ROBAXIN) 500 MG tablet Take 1 tablet  (500 mg total) by mouth every 8 (eight) hours as needed for muscle spasms. Patient not taking: Reported on 08/19/2019 07/25/19   Jamse Arn, MD  potassium chloride (KLOR-CON M15) 15 MEQ tablet Take 2 tablets (30 mEq total) by mouth daily. Patient not taking: Reported on 08/19/2019 07/26/19   Jamse Arn, MD    Physical Exam:  Constitutional: alert and she appears to be in pain.  Vitals:   08/19/19 1334 08/19/19 1400 08/19/19 1416 08/19/19 1430  BP: 116/68 118/63 118/63 113/60  Pulse: 72 70 66 66  Resp: 16 (!) 23 18 (!) 21  Temp:      TempSrc:      SpO2: 98% 98% 97% 97%  Weight:      Height:       Eyes: PERRL, lids and conjunctivae normal ENMT: Mucous membranes are dry Neck: normal, supple, no masses, no thyromegaly Respiratory: clear to auscultation bilaterally, Normal respiratory effort. No accessory muscle use.   Cardiovascular: Regular rate and rhythm,  no murmurs  Abdomen: no tenderness, no masses palpated. No hepatosplenomegaly. Bowel sounds positive.  Musculoskeletal: bilateral lower extremity chronic wounds on the extensor aspect of the legs with pus and sero sanguinous drainage.  Skin: . 12 cm/ 8 cm  necrotic venous stasis foul smelling, pus, draining ulcers ont he extensor aspects of both legs.  Neurologic: CN 2-12 grossly intact. Psychiatric: Normal judgment and insight. Alert and oriented x 3. Teary, reports in pain.    Labs on Admission: I have personally reviewed following labs and imaging studies  CBC: Recent Labs  Lab 08/19/19 1112  WBC 16.1*  NEUTROABS 13.2*  HGB 10.9*  HCT 35.7*  MCV 92.5  PLT 170   Basic Metabolic Panel: Recent Labs  Lab 08/19/19 1112  NA 131*  K 3.8  CL 94*  CO2 22  GLUCOSE 120*  BUN 54*  CREATININE 3.04*  CALCIUM 9.4   GFR: Estimated Creatinine Clearance: 29.4 mL/min (A) (by C-G formula based on SCr of 3.04 mg/dL (H)). Liver Function Tests: Recent Labs  Lab 08/19/19 1112  AST 12*  ALT 13  ALKPHOS 88  BILITOT  1.2  PROT 10.3*  ALBUMIN 2.9*   No results for input(s): LIPASE, AMYLASE in the last 168 hours. No results for input(s): AMMONIA in the last 168 hours. Coagulation Profile: No results for input(s): INR, PROTIME in the last 168 hours. Cardiac Enzymes: No results for input(s): CKTOTAL, CKMB, CKMBINDEX, TROPONINI in the last 168 hours. BNP (last 3 results) No results for input(s): PROBNP in the last 8760 hours. HbA1C: No results for input(s): HGBA1C in the last 72 hours. CBG: No results for input(s): GLUCAP in the last 168 hours. Lipid Profile: No results for input(s): CHOL, HDL, LDLCALC, TRIG, CHOLHDL, LDLDIRECT in the last 72 hours. Thyroid Function Tests: No results for input(s): TSH, T4TOTAL, FREET4, T3FREE, THYROIDAB in the last 72 hours. Anemia Panel: No results for input(s): VITAMINB12, FOLATE, FERRITIN, TIBC, IRON, RETICCTPCT in the last 72 hours. Urine analysis:    Component Value Date/Time   COLORURINE YELLOW 02/20/2018 2357   APPEARANCEUR HAZY (A) 02/20/2018 2357   LABSPEC 1.015 02/20/2018 2357   PHURINE 5.0 02/20/2018 2357   GLUCOSEU NEGATIVE 02/20/2018 2357   HGBUR SMALL (A) 02/20/2018 2357   BILIRUBINUR NEGATIVE 02/20/2018 2357   KETONESUR NEGATIVE 02/20/2018 2357   PROTEINUR NEGATIVE 02/20/2018 2357   UROBILINOGEN 0.2 04/10/2014 0949   NITRITE NEGATIVE 02/20/2018 2357   LEUKOCYTESUR LARGE (A) 02/20/2018 2357    Radiological Exams on Admission: No results found.  EKG: Independently reviewed.  Sinus rhythm  Assessment/Plan Active Problems:   Asthma   Venous (peripheral) insufficiency   Morbid obesity (HCC)   Anemia of chronic disease   Acute kidney injury ([place])   CKD (chronic kidney disease), stage III (HCC)   H/O skin graft   Non-healing ulcer ([facility])   Nonhealing bilateral lower extremity venous stasis ulcers Admit for IV antibiotics and surgical debridement by orthopedics. Orthopedics consulted by ED and she is scheduled for debridement of the  ulcers on Friday. IV pain control with morphine.   History of nonischemic cardiomyopathy status post ICD Patient is on Lasix at home which is being held for AKI.  Once her renal parameters have improved she can be started on the home dose of Lasix.    Hypertension Well-controlled continue with Coreg at the same dose.    Leukocytosis probably secondary to infected leg wounds. Continue to monitor.    Acute on stage III CKD Probably secondary to ATN  versus poor oral intake/dehydration Gently hydrate and repeat renal parameters in the morning.  Ultrasound  renal, urine sodium, urine creatinine ordered. Continue to monitor urine output. Check urinalysis for signs of infection.    Anemia of chronic disease Baseline hemoglobin appears to be around 10 and stable. Check iron studies   Asthma No wheezing heard  Severity of Illness: The appropriate patient status for this patient is INPATIENT. Inpatient status is judged to be reasonable and necessary in order to provide the required intensity of service to ensure the patient's safety. The patient's presenting symptoms, physical exam findings, and initial radiographic and laboratory data in the context of their chronic comorbidities is felt to place them at high risk for further clinical deterioration. Furthermore, it is not anticipated that the patient will be medically stable for discharge from the hospital within 2 midnights of admission. The following factors support the patient status of inpatient.   " The patient's presenting symptoms include worsening pain, worsening pus drainage, bad odor of the chronic bilateral lower extremity nonhealing ulcers. "  " The chronic co-morbidities include hypertension, nonischemic cardiomyopathy status post ICD, asthma and morbid obesity   * I certify that at the point of admission it is my clinical judgment that the patient will require inpatient hospital care spanning beyond 2 midnights from the  point of admission due to high intensity of service, high risk for further deterioration and high frequency of surveillance required.*    DVT prophylaxis: Lovenox Code Status: Full code Family Communication: Discussed with sister over the phone Disposition Plan: Pending clinical improvement  consults called: Orthopedics Dr. Sharol Given Admission status: Inpatient/MedSurg  Hosie Poisson MD [facility] Pager [phone]  If 7PM-7AM, please contact night-coverage [email or web address] Password [email or web address]  08/19/2019, 2:59 PM

## 2019-08-20 DIAGNOSIS — J45909 Unspecified asthma, uncomplicated: Secondary | ICD-10-CM

## 2019-08-20 DIAGNOSIS — Z945 Skin transplant status: Secondary | ICD-10-CM

## 2019-08-20 LAB — HEMOGLOBIN A1C
Hgb A1c MFr Bld: 5.8 % — ABNORMAL HIGH (ref 4.8–5.6)
Mean Plasma Glucose: 119.76 mg/dL

## 2019-08-20 LAB — CBC
HCT: 28.6 % — ABNORMAL LOW (ref 36.0–46.0)
Hemoglobin: 8.8 g/dL — ABNORMAL LOW (ref 12.0–15.0)
MCH: 27.9 pg (ref 26.0–34.0)
MCHC: 30.8 g/dL (ref 30.0–36.0)
MCV: 90.8 fL (ref 80.0–100.0)
Platelets: 323 10*3/uL (ref 150–400)
RBC: 3.15 MIL/uL — ABNORMAL LOW (ref 3.87–5.11)
RDW: 14.9 % (ref 11.5–15.5)
WBC: 11.2 10*3/uL — ABNORMAL HIGH (ref 4.0–10.5)
nRBC: 0 % (ref 0.0–0.2)

## 2019-08-20 LAB — VANCOMYCIN, RANDOM: Vancomycin Rm: 18

## 2019-08-20 MED ORDER — ALLOPURINOL 100 MG PO TABS
200.0000 mg | ORAL_TABLET | Freq: Every day | ORAL | Status: DC
  Administered 2019-08-21 – 2019-08-27 (×7): 200 mg via ORAL
  Filled 2019-08-20 (×7): qty 2

## 2019-08-20 MED ORDER — ENOXAPARIN SODIUM 30 MG/0.3ML ~~LOC~~ SOLN: 30.0000 mg | SUBCUTANEOUS | Status: DC

## 2019-08-20 MED ORDER — ENOXAPARIN SODIUM 30 MG/0.3ML ~~LOC~~ SOLN
30.0000 mg | SUBCUTANEOUS | Status: DC
  Administered 2019-08-20: 30 mg via SUBCUTANEOUS
  Filled 2019-08-20: qty 0.3

## 2019-08-20 MED ORDER — OXYCODONE HCL 5 MG PO TABS
5.0000 mg | ORAL_TABLET | Freq: Two times a day (BID) | ORAL | Status: DC | PRN
  Filled 2019-08-20: qty 1

## 2019-08-20 MED ORDER — SODIUM CHLORIDE 0.9 % IV SOLN
INTRAVENOUS | Status: DC
  Administered 2019-08-20 – 2019-08-26 (×6): via INTRAVENOUS

## 2019-08-20 MED ORDER — ACETAMINOPHEN 325 MG PO TABS: 325.0000 mg | ORAL_TABLET | ORAL | Status: DC | PRN

## 2019-08-20 MED ORDER — ALBUTEROL SULFATE (2.5 MG/3ML) 0.083% IN NEBU: 3.0000 mL | INHALATION_SOLUTION | Freq: Four times a day (QID) | RESPIRATORY_TRACT | Status: DC | PRN

## 2019-08-20 MED ORDER — VANCOMYCIN HCL 10 G IV SOLR
1250.0000 mg | Freq: Once | INTRAVENOUS | Status: AC
  Administered 2019-08-20: 14:00:00 1250 mg via INTRAVENOUS
  Filled 2019-08-20: qty 1250

## 2019-08-20 MED ORDER — CARVEDILOL 6.25 MG PO TABS
6.2500 mg | ORAL_TABLET | Freq: Two times a day (BID) | ORAL | Status: DC
  Administered 2019-08-20 – 2019-08-26 (×12): 6.25 mg via ORAL
  Filled 2019-08-20 (×14): qty 1

## 2019-08-20 NOTE — Progress Notes (Signed)
PROGRESS NOTE    [name]  ZWC:[id number] DOB: 1961/11/10 DOA: 08/19/2019 PCP: Rutherford Guys, MD   Brief Narrative:  Kari [name] is a 58 y.o. 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 Sharol Given and she was discharged to inpatient rehab presents today from home for worsening lower extremity wounds with increased drainage and foul smell and worsening pain.  Patient denies any fevers or chills, denies chest pain shortness of breath or cough, denies any nausea vomiting abdominal pain, dysuria, hematochezia or hematemesis. She is able to walk with assistance and pain is a limiting factor for ambulation. On arrival to ED she was found to be afebrile normotensive.  Labs revealed AKI with creatinine of 3.04, sodium of 131, glucose of 120, BUN of 54, lactic acid was 1.7, WBC count 16.1, hemoglobin of 10.9.  Blood cultures were drawn and are pending. EKG shows sinus rhythm with prolonged PR interval. Orthopedics consulted , Dr. Sharol Given requested to transfer the patient to [facility] for possible I&D on Friday. She was referred to medical service/TRH for evaluation and management of chronic nonhealing lower extremity venous ulcers.  Assessment & Plan:   Active Problems:   Asthma   Venous (peripheral) insufficiency   Morbid obesity (HCC)   Anemia of chronic disease   Acute kidney injury ([place])   CKD (chronic kidney disease), stage III (HCC)   H/O skin graft   Non-healing ulcer (HCC)  Nonhealing bilateral lower extremity venous stasis ulcers Continue vancomycin, Zosyn Tentative procedure 08/21/2019 per Dr. Sharol Given Continue pain control per orthopedic surgery  History of nonischemic cardiomyopathy status post ICD Continue to hold  patient's home furosemide given AKI as below  Hypertension Well-controlled continue with Coreg at the same dose.  Acute on stage III CKD Probably secondary to ATN versus poor oral intake/dehydration Continue IV fluids Creatinine currently 3.04, baseline appears to be around 1.2/1.3  Anemia of chronic disease Baseline hemoglobin appears to be around 10 and stable. At baseline  Asthma, not in acute exacerbation Follow clinically  DVT prophylaxis: Lovenox Code Status: Full Disposition Plan: Pending clinical improvement, surgical evaluation and planned procedure.  Continues to require IV antibiotics, further surgical evaluation and procedure in the next 24 to 48 hours.  Consultants:   Orthopedic surgery  Procedures:   Pending possible debridement and evaluation on 08/21/2019  Subjective: No acute issues or events overnight, pain currently well controlled, tolerating p.o., denies chest pain, shortness of breath, nausea, vomiting, diarrhea, constipation, headache, fevers, chills.  Objective: Vitals:   08/19/19 1823 08/19/19 2122 08/20/19 0520 08/20/19 0600  BP: 110/61 (!) 116/53 126/60   Pulse: 79 83 78   Resp: 19 20 (!) 22   Temp: 98.4 F (36.9 C) 98.6 F (37 C) 99 F (37.2 C)   TempSrc: Oral Oral Oral   SpO2: 97% 96% 97%   Weight: 125.1 kg   125.8 kg  Height:        Intake/Output Summary (Last 24 hours) at 08/20/2019 1430 Last data filed at 08/20/2019 0630 Gross per 24 hour  Intake 1765 ml  Output 500 ml  Net 1265 ml   Filed Weights   08/19/19 1110 08/19/19 1823 08/20/19 0600  Weight: 135.6 kg 125.1 kg 125.8 kg    Examination:  General exam: Appears calm  and comfortable  Respiratory system: Clear to auscultation. Respiratory effort normal. Cardiovascular system: S1 & S2 heard, RRR. No JVD, murmurs, rubs, gallops or clicks. No pedal edema. Gastrointestinal system: Abdomen is nondistended, soft and nontender. No organomegaly or masses felt. Normal bowel  sounds heard. Central nervous system: Alert and oriented. No focal neurological deficits. Extremities: Symmetric 5 x 5 power. Skin: . 12 cm/ 8 cm  necrotic venous stasis foul smelling, pus, draining ulcers ont he extensor aspects of both legs.  Psychiatry: Judgement and insight appear normal. Mood & affect appropriate.     Data Reviewed: I have personally reviewed following labs and imaging studies  CBC: Recent Labs  Lab 08/19/19 1112  WBC 16.1*  NEUTROABS 13.2*  HGB 10.9*  HCT 35.7*  MCV 92.5  PLT 361   Basic Metabolic Panel: Recent Labs  Lab 08/19/19 1112  NA 131*  K 3.8  CL 94*  CO2 22  GLUCOSE 120*  BUN 54*  CREATININE 3.04*  CALCIUM 9.4   Liver Function Tests: Recent Labs  Lab 08/19/19 1112  AST 12*  ALT 13  ALKPHOS 88  BILITOT 1.2  PROT 10.3*  ALBUMIN 2.9*   Sepsis Labs: Recent Labs  Lab 08/19/19 1115  LATICACIDVEN 1.7    Recent Results (from the past 240 hour(s))  Culture, blood (routine x 2)     Status: None (Preliminary result)   Collection Time: 08/19/19 11:27 AM   Specimen: BLOOD  Result Value Ref Range Status   Specimen Description   Final    BLOOD RIGHT ANTECUBITAL Performed at [facility], [place] [address], [place], [place] [address]    Special Requests   Final    BOTTLES DRAWN AEROBIC AND ANAEROBIC Blood Culture adequate volume Performed at [facility] [address], [place], [place] [address]    Culture   Final    NO GROWTH < 24 HOURS Performed at [facility] [address], [place], [place] [address]    Report Status PENDING  Incomplete  SARS CORONAVIRUS 2 (TAT 6-12 HRS) Nasal Swab Aptima Multi Swab     Status: None   Collection Time: 08/19/19 11:28 AM   Specimen: Aptima Multi Swab; Nasal Swab  Result Value Ref Range Status   SARS Coronavirus 2 NEGATIVE NEGATIVE Final    Comment: (NOTE) SARS-CoV-2 target nucleic acids are NOT DETECTED. The SARS-CoV-2 RNA is generally detectable in upper  and lower respiratory specimens during the acute phase of infection. Negative results do not preclude SARS-CoV-2 infection, do not rule out co-infections with other pathogens, and should not be used as the sole basis for treatment or other patient management decisions. Negative results must be combined with clinical observations, patient history, and epidemiological information. The expected result is Negative. Fact Sheet for Patients: SugarRoll.be Fact Sheet for Healthcare Providers: https://www.woods-mathews.com/ This test is not yet approved or cleared by the [place] FDA and  has been authorized for detection and/or diagnosis of SARS-CoV-2 by FDA under an Emergency Use Authorization (EUA). This EUA will remain  in effect (meaning this test can be used) for the duration of the COVID-19 declaration under Section 56 4(b)(1) of the Act, 21 U.S.C. section 360bbb-3(b)(1), unless the authorization is terminated or revoked sooner. Performed at [facility], [place] [address], [place], [place] [address]   Culture, blood (routine x 2)     Status: None (Preliminary result)   Collection Time: 08/19/19 11:32 AM   Specimen: BLOOD  Result Value  Ref Range Status   Specimen Description   Final    BLOOD LEFT ANTECUBITAL Performed at [facility], [place] [address], [place], [place] [address]    Special Requests   Final    BOTTLES DRAWN AEROBIC AND ANAEROBIC Blood Culture adequate volume Performed at [facility] [address], [place], [place] [address]    Culture   Final    NO GROWTH < 24 HOURS Performed at [facility] [address], [place], [place] [address]    Report Status PENDING  Incomplete    Radiology Studies: Dg Tibia/fibula Left  Result Date: 08/19/2019 CLINICAL DATA:  Recent irrigation and debridement EXAM: LEFT TIBIA AND FIBULA - 2 VIEW COMPARISON:  None. FINDINGS: Diffuse lower extremity edema  with ulceration and numerous overlying skin staples. Scattered soft tissue calcifications are similar in distribution to prior exam. The tibia and fibula are intact. No erosive change or periostitis to suggest early radiographic features of osteomyelitis. Degenerative changes at the knee and ankle are similar to comparison and incompletely evaluated on these nondedicated radiographs. Neuropathic changes are noted in the left foot. IMPRESSION: Diffuse lower extremity edema with ulceration and numerous overlying skin staples. No radiographic evidence of osteomyelitis. Electronically Signed   By: Lovena Le M.D.   On: 08/19/2019 16:55   Dg Tibia/fibula Right  Result Date: 08/19/2019 CLINICAL DATA:  Recent irrigation and drainage EXAM: RIGHT TIBIA AND FIBULA - 2 VIEW COMPARISON:  Radiographs 02/20/2018 FINDINGS: Extensive lower extremity edema with postsurgical soft tissue changes and multiple overlying skin staples of the lower leg. Mineralization is within the soft tissues are similar to prior exams. There is stable appearance of wavy periosteal thickening of the proximal femur though new periosteal reaction is present along the lateral aspect of the distal tibia as indicated by air on image. Extensive degenerative changes are present at the knee and ankle similar to prior and incompletely evaluated on these nondedicated radiographs. Plantar and posterior calcaneal spurs are present. IMPRESSION: New area of periosteal elevation and thickening along the lateral tibia. Could reflect postsurgical change versus early osteomyelitis. Correlate with symptoms and surgical history. Electronically Signed   By: Lovena Le M.D.   On: 08/19/2019 16:53   US Renal  Result Date: 08/19/2019 CLINICAL DATA:  Acute kidney injury. EXAM: RENAL / URINARY TRACT ULTRASOUND COMPLETE COMPARISON:  CT abdomen dated August 04, 2018. FINDINGS: Right Kidney: Renal measurements: 12.2 x 4.3 x 4.6 cm = volume: 126 mL. Increased  echogenicity. Unchanged mild hydronephrosis. No mass visualized. Left Kidney: Renal measurements: 12.0 x 5.1 x 4.2 cm = volume: 135 mL. Increased echogenicity. No mass or hydronephrosis visualized. Bladder: Decompressed. Large cystic mass within the abdomen again noted, better evaluated on prior CTs. IMPRESSION: 1. Unchanged mild right hydronephrosis. 2. Increased renal echogenicity, suggestive of underlying medical renal disease. 3. Large cystic mass within the abdomen again noted, thought to be arising from the left ovary based on prior CTs. Electronically Signed   By: Titus Dubin M.D.   On: 08/19/2019 16:34    Scheduled Meds: . enoxaparin (LOVENOX) injection  30 mg Subcutaneous Q24H  . vancomycin variable dose per unstable renal function (pharmacist dosing)   Does not apply See admin instructions   Continuous Infusions: . sodium chloride    . piperacillin-tazobactam (ZOSYN)  IV 3.375 g (08/20/19 0509)  . vancomycin 1,250 mg (08/20/19 1412)     LOS: 1 day   Time spent: 52min  Bee Marchiano C Eros Montour, DO [facility]  If 7PM-7AM, please contact night-coverage [email or web address] Password [email or web address] 08/20/2019, 2:30 PM

## 2019-08-20 NOTE — Progress Notes (Signed)
Pharmacy Antibiotic Note  Kari [name] is a 58 y.o. female admitted on 08/19/2019 with wound infection.  Pharmacy has been consulted for vancomycin and piperacillin/tazobactam dosing.  Pt has PMH significant for CKD stage II, chronic bilateral lower extremity ulcers with recent I&D with skin grafts on 07/17/19. Pt presenting with worsening lower extremity wounds with increased drainage.    Today, vancomycin random level = 18  Plan:  Continue Zosyn  Vancomycin 1250 mg iv x 1 today  SCr daily  Follow cultures  Check vancomycin levels once at steady state if indicated  Height: 5\' 7"  (170.2 cm) Weight: 277 lb 5.4 oz (125.8 kg) IBW/kg (Calculated) : 61.6  Temp (24hrs), Avg:98.7 F (37.1 C), Min:98.4 F (36.9 C), Max:99 F (37.2 C)  Recent Labs  Lab 08/19/19 1112 08/19/19 1115 08/20/19 1208  WBC 16.1*  --   --   CREATININE 3.04*  --   --   LATICACIDVEN  --  1.7  --   VANCORANDOM  --   --  18    Estimated Creatinine Clearance: 28.1 mL/min (A) (by C-G formula based on SCr of 3.04 mg/dL (H)).    Allergies  Allergen Reactions  . Tramadol Other (See Comments)    hallucination    Antimicrobials this admission: Vancomycin 8/26 >>  Piperacillin/tazobactam 8/26 >>   Dose adjustments this admission:  Microbiology results: 8/26 BCx: Pending 8/26 SARS-2: Sent  Thank you for allowing pharmacy to be a part of this patient's care.  Tad Moore, PharmD 08/20/2019 1:21 PM

## 2019-08-20 NOTE — TOC Initial Note (Signed)
Transition of Care [facility]) - Initial/Assessment Note    Patient Details  Name: Kari [name] MRN: [id number] Date of Birth: Jul 02, 1961  Transition of Care [facility]) CM/SW Contact:    Marilu Favre, RN Phone Number: 08/20/2019, 10:22 AM  Clinical Narrative:                  Confirmed face sheet information with patient at bedside. Patient from home with sister. Patient active with [facility] for [place], however, was going to Dr Jess Barters office for wound care. Confirmed same with Tiffany with [facility].  Patient has Rollator, rolling walker, cane, 3 in1, tub bench at home already.   Possible I and D planned for 08/21/19 will continue to follow.  Expected Discharge Plan: [place] Barriers to Discharge: Continued Medical Work up   Patient Goals and CMS Choice Patient states their goals for this hospitalization and ongoing recovery are:: to return to home CMS Medicare.gov Compare Post Acute Care list provided to:: Patient Choice offered to / list presented to : Patient  Expected Discharge Plan and Services Expected Discharge Plan: [place]   Discharge Planning Services: CM Consult Post Acute Care Choice: [place] arrangements for the past 2 months: Apartment Expected Discharge Date: (unknown)               DME Arranged: N/A           HH Agency: [place] (now [facility]) Date [place]: 08/20/19 Time [place]: 63 Representative spoke with at [place]: [facility]  Prior Living Arrangements/Services Living arrangements for the past 2 months: [place] with:: Siblings(sister) Patient language and need for interpreter reviewed:: Yes Do you feel safe going back to the place where you live?: Yes      Need for Family Participation in Patient Care: Yes (Comment) Care giver support system in place?: Yes (comment) Current home services: DME, Home PT Criminal Activity/Legal  Involvement Pertinent to Current Situation/Hospitalization: No - Comment as needed  Activities of Daily Living Home Assistive Devices/Equipment: Environmental consultant (specify type), Other (Comment)(4 wheeled walker, single point cane, tub/shower unit) ADL Screening (condition at time of admission) Patient's cognitive ability adequate to safely complete daily activities?: Yes Is the patient deaf or have difficulty hearing?: No Does the patient have difficulty seeing, even when wearing glasses/contacts?: No Does the patient have difficulty concentrating, remembering, or making decisions?: No Patient able to express need for assistance with ADLs?: Yes Does the patient have difficulty dressing or bathing?: Yes Independently performs ADLs?: No Communication: Independent Dressing (OT): Needs assistance Is this a change from baseline?: Pre-admission baseline Grooming: Needs assistance Is this a change from baseline?: Pre-admission baseline Feeding: Independent Bathing: Needs assistance Is this a change from baseline?: Pre-admission baseline Toileting: Needs assistance Is this a change from baseline?: Pre-admission baseline In/Out Bed: Needs assistance Is this a change from baseline?: Pre-admission baseline Walks in Home: Needs assistance Is this a change from baseline?: Pre-admission baseline Does the patient have difficulty walking or climbing stairs?: Yes Weakness of Legs: Both Weakness of Arms/Hands: None  Permission Sought/Granted   Permission granted to share information with : Yes, Verbal Permission Granted     Permission granted to share info w AGENCY: [facility]        Emotional Assessment Appearance:: Appears stated age Attitude/Demeanor/Rapport: Engaged Affect (typically observed): Accepting Orientation: : Oriented to Situation, Oriented to  Time, Oriented to Place, Oriented to  Self Alcohol / Substance Use: Not Applicable Psych Involvement: No (comment)  Admission diagnosis:   Cellulitis of left lower extremity [L03.116] Cellulitis of right lower extremity [L03.115] AKI (acute kidney injury) ([place]) [N17.9] Multiple open wounds of lower leg [S81.809A] Patient Active Problem List   Diagnosis Date Noted  . Non-healing ulcer ([place]) 08/19/2019  . Postoperative pain   . Labile blood pressure   . Nonischemic cardiomyopathy ([place])   . Physical debility 07/20/2019  . Idiopathic chronic venous hypertension of lower extremity with ulcer, bilateral ([place]) 07/15/2019  . H/O skin graft 05/13/2019  . CKD (chronic kidney disease), stage III ([place]) 01/13/2019  . Infected wound 12/26/2018  . Idiopathic chronic venous hypertension of both lower extremities with ulcer and inflammation ([place])   . History of fall 05/10/2018  . Hyperkalemia 02/21/2018  . Hypotension 02/21/2018  . Sepsis ([place]) 02/21/2018  . Encounter for central line placement   . Mitral regurgitation 10/30/2017  . Acute kidney injury ([place]) 07/21/2017  . Generalized weakness 07/20/2017  . Shock circulatory ([place])   . Acute respiratory failure with hypoxia ([place])   . Pulmonary edema   . Cardiac arrest ([place]) 06/26/2017  . Ventricular fibrillation ([place])   . Acute on chronic diastolic heart failure ([place])   . Abnormal ECG   . Cellulitis 04/02/2017  . AKI (acute kidney injury) ([place]) 12/17/2016  . Cellulitis and abscess of right lower extremity 12/17/2016  . Syncope and collapse 12/17/2016  . Peripheral edema 12/17/2016  . Hypokalemia 12/17/2016  . Anemia of chronic disease 12/17/2016  . Acute diastolic CHF (congestive heart failure) ([place]) 03/14/2014  . CHF (congestive heart failure) ([place]) 03/12/2014  . Sarcoidosis ([place]) 03/12/2014  . Severe sepsis ([place]) 10/17/2013  . ARF (acute renal failure) ([place]) 10/08/2013  . Cellulitis of multiple sites of lower extremity 10/08/2013  . Morbid obesity ([place]) 10/08/2013  . Volume depletion 10/08/2013  . Venous (peripheral) insufficiency 10/28/2012  . Mediastinal lymphadenopathy  10/16/2012  . Pulmonary nodules 10/16/2012  . 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 Chest pain 09/02/2012  . Asthma   . Hypertension   . Depression   . Venous stasis ulcers ([place]) 07/29/2012  . Varicose veins of lower extremities with ulcer ([place]) 07/08/2012  . Venous ulcer of leg ([place]) 07/08/2012   PCP:  Rutherford Guys, MD Pharmacy:   [facility] AT [address], [place] [address] Phone: [phone] Fax: [place], [place] - [address] [place] [address] Phone: [phone] Fax: [phone]     Social Determinants of Health (SDOH) Interventions    Readmission Risk Interventions No flowsheet data found.

## 2019-08-21 ENCOUNTER — Encounter: Payer: Medicare Other | Admitting: Cardiology

## 2019-08-21 LAB — CBC
HCT: 26.8 % — ABNORMAL LOW (ref 36.0–46.0)
Hemoglobin: 8.2 g/dL — ABNORMAL LOW (ref 12.0–15.0)
MCH: 28 pg (ref 26.0–34.0)
MCHC: 30.6 g/dL (ref 30.0–36.0)
MCV: 91.5 fL (ref 80.0–100.0)
Platelets: 289 10*3/uL (ref 150–400)
RBC: 2.93 MIL/uL — ABNORMAL LOW (ref 3.87–5.11)
RDW: 14.8 % (ref 11.5–15.5)
WBC: 9 10*3/uL (ref 4.0–10.5)
nRBC: 0 % (ref 0.0–0.2)

## 2019-08-21 LAB — BASIC METABOLIC PANEL
Anion gap: 9 (ref 5–15)
BUN: 26 mg/dL — ABNORMAL HIGH (ref 6–20)
CO2: 22 mmol/L (ref 22–32)
Calcium: 8.4 mg/dL — ABNORMAL LOW (ref 8.9–10.3)
Chloride: 107 mmol/L (ref 98–111)
Creatinine, Ser: 1.35 mg/dL — ABNORMAL HIGH (ref 0.44–1.00)
GFR calc Af Amer: 50 mL/min — ABNORMAL LOW (ref >60)
GFR calc non Af Amer: 43 mL/min — ABNORMAL LOW (ref >60)
Glucose, Bld: 113 mg/dL — ABNORMAL HIGH (ref 70–99)
Potassium: 3.3 mmol/L — ABNORMAL LOW (ref 3.5–5.1)
Sodium: 138 mmol/L (ref 135–145)

## 2019-08-21 LAB — HIV ANTIBODY (ROUTINE TESTING W REFLEX): HIV Screen 4th Generation wRfx: NONREACTIVE

## 2019-08-21 MED ORDER — VANCOMYCIN HCL 10 G IV SOLR
1250.0000 mg | INTRAVENOUS | Status: DC
  Administered 2019-08-21 – 2019-08-26 (×6): 1250 mg via INTRAVENOUS
  Filled 2019-08-21 (×6): qty 1250

## 2019-08-21 MED ORDER — ENOXAPARIN SODIUM 60 MG/0.6ML ~~LOC~~ SOLN
60.0000 mg | SUBCUTANEOUS | Status: DC
  Administered 2019-08-21 – 2019-08-22 (×2): 60 mg via SUBCUTANEOUS
  Filled 2019-08-21 (×2): qty 0.6

## 2019-08-21 NOTE — Progress Notes (Signed)
PROGRESS NOTE    Kari [name]  AUQ:[id number] DOB: 1961/08/28 DOA: 08/19/2019 PCP: Rutherford Guys, MD   Brief Narrative:  Kari [name] is a 58 y.o. 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 Sharol Given and she was discharged to inpatient rehab presents today from home for worsening lower extremity wounds with increased drainage and foul smell and worsening pain.  Patient denies any fevers or chills, denies chest pain shortness of breath or cough, denies any nausea vomiting abdominal pain, dysuria, hematochezia or hematemesis. She is able to walk with assistance and pain is a limiting factor for ambulation. On arrival to ED she was found to be afebrile normotensive.  Labs revealed AKI with creatinine of 3.04, sodium of 131, glucose of 120, BUN of 54, lactic acid was 1.7, WBC count 16.1, hemoglobin of 10.9.  Blood cultures were drawn and are pending. EKG shows sinus rhythm with prolonged PR interval. Orthopedics consulted , Dr. Sharol Given requested to transfer the [facility] to [facility] for [facility] I&D on Friday. She was referred to medical service/TRH for evaluation and management of chronic nonhealing lower extremity venous ulcers.  Assessment & Plan:   Active Problems:   Asthma   Venous (peripheral) insufficiency   Morbid obesity ([place])   Anemia of chronic disease   Acute kidney injury ([place])   CKD (chronic kidney disease), stage III (HCC)   H/O skin graft   Non-healing ulcer (HCC)   Nonhealing bilateral lower extremity venous stasis ulcers, POA Continue vancomycin, Zosyn Tentative procedure 08/21/2019 per admission note -pending orthopedic schedule Continue pain control per orthopedic surgery  History of nonischemic  cardiomyopathy status post ICD Continue to hold patient's home furosemide given AKI as below  Hypertension Well-controlled continue with Coreg at the same dose.  Acute on stage III CKD Probably secondary to ATN versus poor oral intake/dehydration Continue IV fluids Creatinine currently 3.04, baseline appears to be around 1.2/1.3  Anemia of chronic disease Baseline hemoglobin appears to be around 10 and stable. At baseline  Asthma, not in acute exacerbation Follow clinically  DVT prophylaxis: Lovenox Code Status: Full Disposition Plan: Pending clinical improvement, surgical evaluation and planned procedure.  Continues to require IV antibiotics, further surgical evaluation and procedure in the next 24 to 48 hours.  Consultants:   Orthopedic surgery  Procedures:   Pending possible debridement and evaluation on 08/21/2019  Subjective: No acute issues or events overnight, pain currently well controlled, denies chest pain, shortness of breath, nausea, vomiting, diarrhea, constipation, headache, fevers, chills.  Objective: Vitals:   08/20/19 0600 08/20/19 1623 08/20/19 2019 08/21/19 0514  BP:  (!) 94/46 93/60 (!) 94/55  Pulse:  61 68 66  Resp:  19 20 16   Temp:  98.5 F (36.9 C) 98.8 F (37.1 C) 99.4 F (37.4 C)  TempSrc:  Oral Oral Axillary  SpO2:  100% 98% 98%  Weight: 125.8 kg     Height:        Intake/Output Summary (Last 24 hours) at 08/21/2019 0828 Last data filed at 08/21/2019 0521 Gross per 24 hour  Intake 2380 ml  Output 1675 ml  Net 705 ml   Filed Weights   08/19/19 1110 08/19/19 1823 08/20/19 0600  Weight: 135.6 kg 125.1 kg 125.8 kg    Examination:  General exam:  Appears calm and comfortable  Respiratory system: Clear to auscultation. Respiratory effort normal. Cardiovascular system: S1 & S2 heard, RRR. No JVD, murmurs, rubs, gallops or clicks. No pedal edema. Gastrointestinal system: Abdomen is nondistended, soft and nontender. No organomegaly or  masses felt. Normal bowel sounds heard. Central nervous system: Alert and oriented. No focal neurological deficits. Extremities: Symmetric 5 x 5 power. Skin: . 12 cm/ 8 cm  necrotic venous stasis foul smelling, pus, draining ulcers ont he extensor aspects of both legs.  Psychiatry: Judgement and insight appear normal. Mood & affect appropriate.   Data Reviewed: I have personally reviewed following labs and imaging studies  CBC: Recent Labs  Lab 08/19/19 1112 08/20/19 2302 08/21/19 0611  WBC 16.1* 11.2* 9.0  NEUTROABS 13.2*  --   --   HGB 10.9* 8.8* 8.2*  HCT 35.7* 28.6* 26.8*  MCV 92.5 90.8 91.5  PLT 375 323 144   Basic Metabolic Panel: Recent Labs  Lab 08/19/19 1112 08/21/19 0611  NA 131* 138  K 3.8 3.3*  CL 94* 107  CO2 22 22  GLUCOSE 120* 113*  BUN 54* 26*  CREATININE 3.04* 1.35*  CALCIUM 9.4 8.4*   Liver Function Tests: Recent Labs  Lab 08/19/19 1112  AST 12*  ALT 13  ALKPHOS 88  BILITOT 1.2  PROT 10.3*  ALBUMIN 2.9*   Sepsis Labs: Recent Labs  Lab 08/19/19 1115  LATICACIDVEN 1.7    Recent Results (from the past 240 hour(s))  Culture, blood (routine x 2)     Status: None (Preliminary result)   Collection Time: 08/19/19 11:27 AM   Specimen: BLOOD  Result Value Ref Range Status   Specimen Description   Final    BLOOD RIGHT ANTECUBITAL Performed at [facility], [place] [address], [place], [place] [address]    Special Requests   Final    BOTTLES DRAWN AEROBIC AND ANAEROBIC Blood Culture adequate volume Performed at [facility] [address] [place], [place] [address]    Culture   Final    NO GROWTH 1 DAY Performed at [facility], [place] [address], [place], [place] [address]    Report Status PENDING  Incomplete  SARS CORONAVIRUS 2 (TAT 6-12 HRS) Nasal Swab Aptima Multi Swab     Status: None   Collection Time: 08/19/19 11:28 AM   Specimen: Aptima Multi Swab; Nasal Swab  Result Value Ref Range Status   SARS  Coronavirus 2 NEGATIVE NEGATIVE Final    Comment: (NOTE) SARS-CoV-2 target nucleic acids are NOT DETECTED. The SARS-CoV-2 RNA is generally detectable in upper and lower respiratory specimens during the acute phase of infection. Negative results do not preclude SARS-CoV-2 infection, do not rule out co-infections with other pathogens, and should not be used as the sole basis for treatment or other patient management decisions. Negative results must be combined with clinical observations, patient history, and epidemiological information. The expected result is Negative. Fact Sheet for Patients: SugarRoll.be Fact Sheet for Healthcare Providers: https://www.woods-mathews.com/ This test is not yet approved or cleared by the [place] FDA and  has been authorized for detection and/or diagnosis of SARS-CoV-2 by FDA under an Emergency Use Authorization (EUA). This EUA will remain  in effect (meaning this test can be used) for the duration of the COVID-19 declaration under Section 56 4(b)(1) of the Act, 21 U.S.C. section 360bbb-3(b)(1), unless the authorization is terminated or revoked sooner. Performed at [facility], [place] [address] [place], [place] [address]  Culture, blood (routine x 2)     Status: None (Preliminary result)   Collection Time: 08/19/19 11:32 AM   Specimen: BLOOD  Result Value Ref Range Status   Specimen Description   Final    BLOOD LEFT ANTECUBITAL Performed at [facility], [place] [address] [place], [place] [address]    Special Requests   Final    BOTTLES DRAWN AEROBIC AND ANAEROBIC Blood Culture adequate volume Performed at [facility] [address] [place], [place] [address]    Culture   Final    NO GROWTH 1 DAY Performed at [facility], [place] [address], [place], [place] [address]    Report Status PENDING  Incomplete    Radiology Studies: Dg Tibia/fibula Left  Result Date:  08/19/2019 CLINICAL DATA:  Recent irrigation and debridement EXAM: LEFT TIBIA AND FIBULA - 2 VIEW COMPARISON:  None. FINDINGS: Diffuse lower extremity edema with ulceration and numerous overlying skin staples. Scattered soft tissue calcifications are similar in distribution to prior exam. The tibia and fibula are intact. No erosive change or periostitis to suggest early radiographic features of osteomyelitis. Degenerative changes at the knee and ankle are similar to comparison and incompletely evaluated on these nondedicated radiographs. Neuropathic changes are noted in the left foot. IMPRESSION: Diffuse lower extremity edema with ulceration and numerous overlying skin staples. No radiographic evidence of osteomyelitis. Electronically Signed   By: Lovena Le M.D.   On: 08/19/2019 16:55   Dg Tibia/fibula Right  Result Date: 08/19/2019 CLINICAL DATA:  Recent irrigation and drainage EXAM: RIGHT TIBIA AND FIBULA - 2 VIEW COMPARISON:  Radiographs 02/20/2018 FINDINGS: Extensive lower extremity edema with postsurgical soft tissue changes and multiple overlying skin staples of the lower leg. Mineralization is within the soft tissues are similar to prior exams. There is stable appearance of wavy periosteal thickening of the proximal femur though new periosteal reaction is present along the lateral aspect of the distal tibia as indicated by air on image. Extensive degenerative changes are present at the knee and ankle similar to prior and incompletely evaluated on these nondedicated radiographs. Plantar and posterior calcaneal spurs are present. IMPRESSION: New area of periosteal elevation and thickening along the lateral tibia. Could reflect postsurgical change versus early osteomyelitis. Correlate with symptoms and surgical history. Electronically Signed   By: Lovena Le M.D.   On: 08/19/2019 16:53   US Renal  Result Date: 08/19/2019 CLINICAL DATA:  Acute kidney injury. EXAM: RENAL / URINARY TRACT ULTRASOUND  COMPLETE COMPARISON:  CT abdomen dated August 04, 2018. FINDINGS: Right Kidney: Renal measurements: 12.2 x 4.3 x 4.6 cm = volume: 126 mL. Increased echogenicity. Unchanged mild hydronephrosis. No mass visualized. Left Kidney: Renal measurements: 12.0 x 5.1 x 4.2 cm = volume: 135 mL. Increased echogenicity. No mass or hydronephrosis visualized. Bladder: Decompressed. Large cystic mass within the abdomen again noted, better evaluated on prior CTs. IMPRESSION: 1. Unchanged mild right hydronephrosis. 2. Increased renal echogenicity, suggestive of underlying medical renal disease. 3. Large cystic mass within the abdomen again noted, thought to be arising from the left ovary based on prior CTs. Electronically Signed   By: Titus Dubin M.D.   On: 08/19/2019 16:34    Scheduled Meds:  allopurinol  200 mg Oral Daily   carvedilol  6.25 mg Oral BID   enoxaparin (LOVENOX) injection  30 mg Subcutaneous Q24H   vancomycin variable dose per unstable renal function (pharmacist dosing)   Does not apply See admin instructions  Continuous Infusions:  sodium chloride 100 mL/hr at 08/20/19 2325   sodium chloride     piperacillin-tazobactam (ZOSYN)  IV 3.375 g (08/21/19 0505)     LOS: 2 days   Time spent: 61min  Perlie Stene C Timothee Gali, DO [facility]  If 7PM-7AM, please contact night-coverage [email or web address] Password [facility] 08/21/2019, 8:28 AM

## 2019-08-21 NOTE — Progress Notes (Signed)
Pharmacy Antibiotic Note  Kari [name] is a 58 y.o. female admitted on 08/19/2019 with wound infection.  Pharmacy has been consulted for vancomycin and piperacillin/tazobactam dosing.  Pt has PMH significant for CKD stage II, chronic bilateral lower extremity ulcers with recent I&D with skin grafts on 07/17/19. Pt presenting with worsening lower extremity wounds with increased drainage.    Today, Scr back to baseline  Plan:  Zosyn 3.375 grams iv Q 8 hours  Vancomycin 1250 mg iv Q 24 hours  Continue to follow ortho plan  Height: 5\' 7"  (170.2 cm) Weight: 277 lb 5.4 oz (125.8 kg) IBW/kg (Calculated) : 61.6  Temp (24hrs), Avg:98.9 F (37.2 C), Min:98.5 F (36.9 C), Max:99.4 F (37.4 C)  Recent Labs  Lab 08/19/19 1112 08/19/19 1115 08/20/19 1208 08/20/19 2302 08/21/19 0611  WBC 16.1*  --   --  11.2* 9.0  CREATININE 3.04*  --   --   --  1.35*  LATICACIDVEN  --  1.7  --   --   --   VANCORANDOM  --   --  18  --   --     Estimated Creatinine Clearance: 63.4 mL/min (A) (by C-G formula based on SCr of 1.35 mg/dL (H)).    Allergies  Allergen Reactions  . Tramadol Other (See Comments)    hallucination    Antimicrobials this admission: Vancomycin 8/26 >>  Piperacillin/tazobactam 8/26 >>   Dose adjustments this admission:  Microbiology results: 8/26 BCx: NGTD 8/26 SARS-2: Sent  Thank you for allowing pharmacy to be a part of this patient's care.  Tad Moore, PharmD 08/21/2019 10:25 AM

## 2019-08-21 NOTE — Consult Note (Signed)
   [facility] CM Inpatient Consult   08/21/2019  Kari [name] 1961/12/24 [id number]    Patientscreened forreadmission within 30 days and 3 hospitalizations in the past 6 months; and to check for potential need of [facility] care management services with25%high risk scorefor unplanned readmissionand hospitalization, as benefit from her Medicare/ NextGen plan.  Patient had been actively engaged by [facility] care management coordinators in the distant past.Our community based plan of care has focused on disease management and community resource support.  Per review of patient's medical record and MD brief narrative on 08/21/19, show as follows:   Kari M Houghtonis a 58 y.o.femalewith medical history significant ofchronic diastolic heart failure, asthma, hypertension, stage 2 to stage III CKD, chronic bilateral lower extremity ulcers since 2 years,and underwent recent irrigation and debridement of the bilateral lower extremity ulcers with skin grafts point 07/17/2019 by Dr. Sharol Given and she was discharged to inpatient rehab,   She presented from home for worsening lower extremity wounds with increased drainage and foul smell and worsening pain. She is able to walk with assistance and pain is a limiting factor for ambulation. Orthopedics consulted, Dr. Sharol Given requested to transfer the [facility] to [facility] on Friday. She was referred to medical service/TRH for evaluation and management of chronic nonhealing lower extremity venous ulcers.  Patient's primary care provider [place] [facility], listed to provide transition of care.  Review of transition of care CM notes show that patient's current discharge plan is likely to transition home with home health services ([facility]). Patient from home with sister. Patient active with [facility] for [place], however, was going to Dr Jess Barters office for wound care.  Tentative procedure (surgical debridement of  ulcers) 08/21/2019 per admission note-pending orthopedic schedule.  Plan:Will continue to follow withInpatient TOC teamfor needs. Continue to follow progress and disposition to assess for post hospital care management needs.  Please place a [place] Management consultfor any changesin disposition and for follow-up as appropriate.  Of note, [facility] Care Management services does not replace or interfere with any services that are arranged by transition of care case management or social work.   For questionsand referral, pleasecontact:  Edwena Felty A. Macallan Ord, BSN, RN-BC [facility] Liaison Cell: [phone]

## 2019-08-22 DIAGNOSIS — S81809A Unspecified open wound, unspecified lower leg, initial encounter: Secondary | ICD-10-CM

## 2019-08-22 DIAGNOSIS — E440 Moderate protein-calorie malnutrition: Secondary | ICD-10-CM

## 2019-08-22 LAB — BASIC METABOLIC PANEL
Anion gap: 7 (ref 5–15)
BUN: 15 mg/dL (ref 6–20)
CO2: 23 mmol/L (ref 22–32)
Calcium: 8.3 mg/dL — ABNORMAL LOW (ref 8.9–10.3)
Chloride: 110 mmol/L (ref 98–111)
Creatinine, Ser: 1.3 mg/dL — ABNORMAL HIGH (ref 0.44–1.00)
GFR calc Af Amer: 53 mL/min — ABNORMAL LOW (ref >60)
GFR calc non Af Amer: 46 mL/min — ABNORMAL LOW (ref >60)
Glucose, Bld: 102 mg/dL — ABNORMAL HIGH (ref 70–99)
Potassium: 3.3 mmol/L — ABNORMAL LOW (ref 3.5–5.1)
Sodium: 140 mmol/L (ref 135–145)

## 2019-08-22 LAB — CBC
HCT: 28.8 % — ABNORMAL LOW (ref 36.0–46.0)
Hemoglobin: 8.6 g/dL — ABNORMAL LOW (ref 12.0–15.0)
MCH: 27.8 pg (ref 26.0–34.0)
MCHC: 29.9 g/dL — ABNORMAL LOW (ref 30.0–36.0)
MCV: 93.2 fL (ref 80.0–100.0)
Platelets: 282 10*3/uL (ref 150–400)
RBC: 3.09 MIL/uL — ABNORMAL LOW (ref 3.87–5.11)
RDW: 14.9 % (ref 11.5–15.5)
WBC: 7.9 10*3/uL (ref 4.0–10.5)
nRBC: 0 % (ref 0.0–0.2)

## 2019-08-22 LAB — SURGICAL PCR SCREEN
MRSA, PCR: NEGATIVE
Staphylococcus aureus: NEGATIVE

## 2019-08-22 MED ORDER — ENSURE PRE-SURGERY PO LIQD
296.0000 mL | Freq: Once | ORAL | Status: AC
  Administered 2019-08-22: 23:00:00 296 mL via ORAL
  Filled 2019-08-22: qty 296

## 2019-08-22 NOTE — Plan of Care (Signed)
  Problem: Health Behavior/Discharge Planning: Goal: Ability to manage health-related needs will improve Outcome: Progressing   

## 2019-08-22 NOTE — Consult Note (Signed)
ORTHOPAEDIC CONSULTATION  REQUESTING PHYSICIAN: Little Ishikawa, MD  Chief Complaint: Necrotic ulcerations both legs.  HPI: Kari [name] is a 58 y.o. female who presents with necrotic ulceration bilateral split-thickness skin grafts.  Patient states she was unable to perform wound care at home due to medical conditions of other family members.  Past Medical History:  Diagnosis Date  . Arthritis   . Asthma   . CHF (congestive heart failure) ([place]) 03/12/2014   a. EF 40-45% by echo in 06/2017 with normal cors by cath. ICD placed following VT arrest  . Depression   . Headache(784.0)   . History of blood transfusion   . Hyperlipidemia   . Hypertension   . Lung nodules   . Myocardial infarction ([place]) 06/26/2014  . Renal disorder    followed by [facility]  . Sarcoidosis   . Ulcer    recurring, from chronic venous insufficiency   Past Surgical History:  Procedure Laterality Date  . cataract surgery Left 07-2013  . I&D EXTREMITY Bilateral 12/31/2018   Procedure: DEBRIDEMENT BILATERAL LEGS, APPLY VAC X 2;  Surgeon: [name], MD;  Location: [facility];  Service: Orthopedics;  Laterality: Bilateral;  . I&D EXTREMITY Bilateral 01/02/2019   Procedure: REPEAT DEBRIDEMENT BILATERAL LEGS, APPLY VAC X 2;  Surgeon: [name], MD;  Location: [facility];  Service: Orthopedics;  Laterality: Bilateral;  . I&D EXTREMITY Bilateral 07/15/2019   Procedure: IRRIGATION AND DEBRIDEMENT VENOUS STASIS INSUFFICIENCY ULCERATIONS BILATERAL LOWER EXTREMITIES;  Surgeon: [name], MD;  Location: [facility];  Service: Orthopedics;  Laterality: Bilateral;  . ICD IMPLANT N/A 07/05/2017   Procedure: ICD Implant;  Surgeon: Constance Haw, MD;  Location: [facility];  Service: Cardiovascular;  Laterality: N/A;  . LEFT HEART CATH AND CORONARY ANGIOGRAPHY N/A 07/03/2017   Procedure: Left Heart Cath and Coronary Angiography;  Surgeon: Belva Crome, MD;  Location: [facility];   Service: Cardiovascular;  Laterality: N/A;  . SKIN SPLIT GRAFT Bilateral 01/07/2019   Procedure: SPLIT THICKNESS SKIN GRAFT BILATERAL LEGS, APPLY VAC;  Surgeon: [name], MD;  Location: [facility];  Service: Orthopedics;  Laterality: Bilateral;  . SKIN SPLIT GRAFT Bilateral 07/17/2019   Procedure: REPEAT IRRIGATION AND DEBRIDEMENT BILATERAL LOWER EXTREMITIES, SKIN GRAFT;  Surgeon: [name], MD;  Location: [facility];  Service: Orthopedics;  Laterality: Bilateral;   Social History   Socioeconomic History  . Marital status: Single    Spouse name: Not on file  . Number of children: 1  . Years of education: Not on file  . Highest education level: Not on file  Occupational History  . Not on file  Social Needs  . Financial resource strain: Not on file  . Food insecurity    Worry: Not on file    Inability: Not on file  . Transportation needs    Medical: Not on file    Non-medical: Not on file  Tobacco Use  . Smoking status: Never Smoker  . Smokeless tobacco: Never Used  Substance and Sexual Activity  . Alcohol use: No  . Drug use: No  . Sexual activity: Not Currently  Lifestyle  . Physical activity    Days per week: Not on file    Minutes per session: Not on file  . Stress: Not on file  Relationships  . Social Herbalist on phone: Not on file    Gets together: Not on  file    Attends religious service: Not on file    Active member of club or organization: Not on file    Attends meetings of clubs or organizations: Not on file    Relationship status: Not on file  Other Topics Concern  . Not on file  Social History Narrative   Lives alone.   Family History  Problem Relation Age of Onset  . Heart failure Mother   . Diabetes Mellitus II Mother   . Hypertension Mother   . Cancer Mother        unknown type  . Heart disease Father   . Stroke Father   . Diabetes Mellitus II Father    - negative except otherwise stated in the family history section Allergies   Allergen Reactions  . Tramadol Other (See Comments)    hallucination   Prior to Admission medications   Medication Sig Start Date End Date Taking? Authorizing Provider  acetaminophen (TYLENOL) 325 MG tablet Take 1-2 tablets (325-650 mg total) by mouth every 4 (four) hours as needed for mild pain. 07/22/19  Yes Love, Ivan Anchors, PA-C  albuterol (PROVENTIL HFA;VENTOLIN HFA) 108 (90 Base) MCG/ACT inhaler Inhale 2 puffs into the lungs every 6 (six) hours as needed for shortness of breath. 02/02/19  Yes Medina-Vargas, Monina C, NP  allopurinol (ZYLOPRIM) 100 MG tablet Take 2 tablets (200 mg total) by mouth daily. Patient taking differently: Take 100 mg by mouth 2 (two) times daily.  05/13/19  Yes Rutherford Guys, MD  aspirin EC 81 MG tablet Take 81 mg by mouth daily.   Yes [provider]  carvedilol (COREG) 6.25 MG tablet Take 1 tablet (6.25 mg total) by mouth 2 (two) times daily. 02/02/19  Yes Medina-Vargas, Monina C, NP  docusate sodium (COLACE) 100 MG capsule Take 1 capsule (100 mg total) by mouth 2 (two) times daily. 07/25/19  Yes Jamse Arn, MD  furosemide (LASIX) 40 MG tablet Take 1.5 tablets (60 mg total) by mouth daily. Take 1-1/2 tablets to = 60 mg Patient taking differently: Take 60 mg by mouth daily.  05/13/19  Yes Rutherford Guys, MD  oxyCODONE (OXY IR/ROXICODONE) 5 MG immediate release tablet Take 1 tablet (5 mg total) by mouth 2 (two) times daily as needed for severe pain. 07/25/19  Yes Jamse Arn, MD  potassium chloride (K-DUR) 10 MEQ tablet Take 30 mEq by mouth daily. 07/25/19  Yes [provider]  methocarbamol (ROBAXIN) 500 MG tablet Take 1 tablet (500 mg total) by mouth every 8 (eight) hours as needed for muscle spasms. Patient not taking: Reported on 08/19/2019 07/25/19   Jamse Arn, MD  potassium chloride (KLOR-CON M15) 15 MEQ tablet Take 2 tablets (30 mEq total) by mouth daily. Patient not taking: Reported on 08/19/2019 07/26/19   Jamse Arn, MD    No results found. - pertinent xrays, CT, MRI studies were reviewed and independently interpreted  Positive ROS: All other systems have been reviewed and were otherwise negative with the exception of those mentioned in the HPI and as above.  Physical Exam: General: Alert, no acute distress Psychiatric: Patient is competent for consent with normal mood and affect Lymphatic: No axillary or cervical lymphadenopathy Cardiovascular: No pedal edema Respiratory: No cyanosis, no use of accessory musculature GI: No organomegaly, abdomen is soft and non-tender    Images:        @ENCIMAGES @  Labs:  Lab Results  Component Value Date   HGBA1C 5.8 (H) 08/20/2019  HGBA1C 5.6 12/17/2016   HGBA1C 5.5 03/12/2014   REPTSTATUS PENDING 08/19/2019   GRAMSTAIN  06/27/2017    ABUNDANT WBC PRESENT,BOTH PMN AND MONONUCLEAR RARE GRAM NEGATIVE RODS RARE GRAM NEGATIVE COCCI IN PAIRS    CULT  08/19/2019    NO GROWTH 2 DAYS Performed at [facility], [place] [address] [place], [place] [address]     Lab Results  Component Value Date   ALBUMIN 2.9 (L) 08/19/2019   ALBUMIN 2.2 (L) 07/21/2019   ALBUMIN 3.6 (L) 02/11/2019    Neurologic: Patient does not have protective sensation bilateral lower extremities.   MUSCULOSKELETAL:   Skin: Examination patient has purulent dehiscence of the skin grafts on both legs.  There is no ascending cellulitis.  Patient's legs show chronic venous insufficiency with brawny skin color changes bilaterally    Patient's albumin shows a protein caloric malnutrition.  Assessment: Assessment: Dehiscence of split-thickness skin graft both legs with necrotic wound bed.  Plan: We will plan for debridement of both legs tomorrow application of a Praveena wound VAC.  Plan for split-thickness skin graft next week.  Anticipate patient will need discharge to inpatient or outpatient rehabilitation to provide wound care.  We will obtain tissue cultures anticipate  patient will need 4 weeks of antibiotic coverage.  Thank you for the consult and the opportunity to see Ms. Jacalyn Lefevre, [place] [phone] 1:17 PM

## 2019-08-22 NOTE — H&P (View-Only) (Signed)
ORTHOPAEDIC CONSULTATION  REQUESTING PHYSICIAN: Little Ishikawa, MD  Chief Complaint: Necrotic ulcerations both legs.  HPI: Kari [name] is a 58 y.o. female who presents with necrotic ulceration bilateral split-thickness skin grafts.  Patient states she was unable to perform wound care at home due to medical conditions of other family members.  Past Medical History:  Diagnosis Date  . Arthritis   . Asthma   . CHF (congestive heart failure) ([place]) 03/12/2014   a. EF 40-45% by echo in 06/2017 with normal cors by cath. ICD placed following VT arrest  . Depression   . Headache(784.0)   . History of blood transfusion   . Hyperlipidemia   . Hypertension   . Lung nodules   . Myocardial infarction ([place]) 06/26/2014  . Renal disorder    followed by [facility]  . Sarcoidosis   . Ulcer    recurring, from chronic venous insufficiency   Past Surgical History:  Procedure Laterality Date  . cataract surgery Left 07-2013  . I&D EXTREMITY Bilateral 12/31/2018   Procedure: DEBRIDEMENT BILATERAL LEGS, APPLY VAC X 2;  Surgeon: [name], MD;  Location: [facility];  Service: Orthopedics;  Laterality: Bilateral;  . I&D EXTREMITY Bilateral 01/02/2019   Procedure: REPEAT DEBRIDEMENT BILATERAL LEGS, APPLY VAC X 2;  Surgeon: [name], MD;  Location: [facility];  Service: Orthopedics;  Laterality: Bilateral;  . I&D EXTREMITY Bilateral 07/15/2019   Procedure: IRRIGATION AND DEBRIDEMENT VENOUS STASIS INSUFFICIENCY ULCERATIONS BILATERAL LOWER EXTREMITIES;  Surgeon: [name], MD;  Location: [name];  Service: Orthopedics;  Laterality: Bilateral;  . ICD IMPLANT N/A 07/05/2017   Procedure: ICD Implant;  Surgeon: Constance Haw, MD;  Location: [facility];  Service: Cardiovascular;  Laterality: N/A;  . LEFT HEART CATH AND CORONARY ANGIOGRAPHY N/A 07/03/2017   Procedure: Left Heart Cath and Coronary Angiography;  Surgeon: Belva Crome, MD;  Location: [facility];   Service: Cardiovascular;  Laterality: N/A;  . SKIN SPLIT GRAFT Bilateral 01/07/2019   Procedure: SPLIT THICKNESS SKIN GRAFT BILATERAL LEGS, APPLY VAC;  Surgeon: [name], MD;  Location: [facility];  Service: Orthopedics;  Laterality: Bilateral;  . SKIN SPLIT GRAFT Bilateral 07/17/2019   Procedure: REPEAT IRRIGATION AND DEBRIDEMENT BILATERAL LOWER EXTREMITIES, SKIN GRAFT;  Surgeon: [name], MD;  Location: [facility];  Service: Orthopedics;  Laterality: Bilateral;   Social History   Socioeconomic History  . Marital status: Single    Spouse name: Not on file  . Number of children: 1  . Years of education: Not on file  . Highest education level: Not on file  Occupational History  . Not on file  Social Needs  . Financial resource strain: Not on file  . Food insecurity    Worry: Not on file    Inability: Not on file  . Transportation needs    Medical: Not on file    Non-medical: Not on file  Tobacco Use  . Smoking status: Never Smoker  . Smokeless tobacco: Never Used  Substance and Sexual Activity  . Alcohol use: No  . Drug use: No  . Sexual activity: Not Currently  Lifestyle  . Physical activity    Days per week: Not on file    Minutes per session: Not on file  . Stress: Not on file  Relationships  . Social Herbalist on phone: Not on file    Gets together: Not on  file    Attends religious service: Not on file    Active member of club or organization: Not on file    Attends meetings of clubs or organizations: Not on file    Relationship status: Not on file  Other Topics Concern  . Not on file  Social History Narrative   Lives alone.   Family History  Problem Relation Age of Onset  . Heart failure Mother   . Diabetes Mellitus II Mother   . Hypertension Mother   . Cancer Mother        unknown type  . Heart disease Father   . Stroke Father   . Diabetes Mellitus II Father    - negative except otherwise stated in the family history section Allergies   Allergen Reactions  . Tramadol Other (See Comments)    hallucination   Prior to Admission medications   Medication Sig Start Date End Date Taking? Authorizing Provider  acetaminophen (TYLENOL) 325 MG tablet Take 1-2 tablets (325-650 mg total) by mouth every 4 (four) hours as needed for mild pain. 07/22/19  Yes Love, Ivan Anchors, PA-C  albuterol (PROVENTIL HFA;VENTOLIN HFA) 108 (90 Base) MCG/ACT inhaler Inhale 2 puffs into the lungs every 6 (six) hours as needed for shortness of breath. 02/02/19  Yes Medina-Vargas, Monina C, NP  allopurinol (ZYLOPRIM) 100 MG tablet Take 2 tablets (200 mg total) by mouth daily. Patient taking differently: Take 100 mg by mouth 2 (two) times daily.  05/13/19  Yes Rutherford Guys, MD  aspirin EC 81 MG tablet Take 81 mg by mouth daily.   Yes [provider]  carvedilol (COREG) 6.25 MG tablet Take 1 tablet (6.25 mg total) by mouth 2 (two) times daily. 02/02/19  Yes Medina-Vargas, Monina C, NP  docusate sodium (COLACE) 100 MG capsule Take 1 capsule (100 mg total) by mouth 2 (two) times daily. 07/25/19  Yes Jamse Arn, MD  furosemide (LASIX) 40 MG tablet Take 1.5 tablets (60 mg total) by mouth daily. Take 1-1/2 tablets to = 60 mg Patient taking differently: Take 60 mg by mouth daily.  05/13/19  Yes Rutherford Guys, MD  oxyCODONE (OXY IR/ROXICODONE) 5 MG immediate release tablet Take 1 tablet (5 mg total) by mouth 2 (two) times daily as needed for severe pain. 07/25/19  Yes Jamse Arn, MD  potassium chloride (K-DUR) 10 MEQ tablet Take 30 mEq by mouth daily. 07/25/19  Yes [provider]  methocarbamol (ROBAXIN) 500 MG tablet Take 1 tablet (500 mg total) by mouth every 8 (eight) hours as needed for muscle spasms. Patient not taking: Reported on 08/19/2019 07/25/19   Jamse Arn, MD  potassium chloride (KLOR-CON M15) 15 MEQ tablet Take 2 tablets (30 mEq total) by mouth daily. Patient not taking: Reported on 08/19/2019 07/26/19   Jamse Arn, MD    No results found. - pertinent xrays, CT, MRI studies were reviewed and independently interpreted  Positive ROS: All other systems have been reviewed and were otherwise negative with the exception of those mentioned in the HPI and as above.  Physical Exam: General: Alert, no acute distress Psychiatric: Patient is competent for consent with normal mood and affect Lymphatic: No axillary or cervical lymphadenopathy Cardiovascular: No pedal edema Respiratory: No cyanosis, no use of accessory musculature GI: No organomegaly, abdomen is soft and non-tender    Images:        @ENCIMAGES @  Labs:  Lab Results  Component Value Date   HGBA1C 5.8 (H) 08/20/2019  HGBA1C 5.6 12/17/2016   HGBA1C 5.5 03/12/2014   REPTSTATUS PENDING 08/19/2019   GRAMSTAIN  06/27/2017    ABUNDANT WBC PRESENT,BOTH PMN AND MONONUCLEAR RARE GRAM NEGATIVE RODS RARE GRAM NEGATIVE COCCI IN PAIRS    CULT  08/19/2019    NO GROWTH 2 DAYS Performed at [facility], [place] [address], [place], [place] [address]     Lab Results  Component Value Date   ALBUMIN 2.9 (L) 08/19/2019   ALBUMIN 2.2 (L) 07/21/2019   ALBUMIN 3.6 (L) 02/11/2019    Neurologic: Patient does not have protective sensation bilateral lower extremities.   MUSCULOSKELETAL:   Skin: Examination patient has purulent dehiscence of the skin grafts on both legs.  There is no ascending cellulitis.  Patient's legs show chronic venous insufficiency with brawny skin color changes bilaterally    Patient's albumin shows a protein caloric malnutrition.  Assessment: Assessment: Dehiscence of split-thickness skin graft both legs with necrotic wound bed.  Plan: We will plan for debridement of both legs tomorrow application of a Praveena wound VAC.  Plan for split-thickness skin graft next week.  Anticipate patient will need discharge to inpatient or outpatient rehabilitation to provide wound care.  We will obtain tissue cultures anticipate  patient will need 4 weeks of antibiotic coverage.  Thank you for the consult and the opportunity to see Kari [name], [place] [phone] 1:17 PM

## 2019-08-22 NOTE — Progress Notes (Signed)
PROGRESS NOTE    Kari [name]  FFM:[id number] DOB: [birth year] DOA: 08/19/2019 PCP: Rutherford Guys, MD   Brief Narrative:  Kari [name] is a 58 y.o. 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 Sharol Given and she was discharged to inpatient rehab presents today from home for worsening lower extremity wounds with increased drainage and foul smell and worsening pain.  Patient denies any fevers or chills, denies chest pain shortness of breath or cough, denies any nausea vomiting abdominal pain, dysuria, hematochezia or hematemesis. She is able to walk with assistance and pain is a limiting factor for ambulation. On arrival to ED she was found to be afebrile normotensive.  Labs revealed AKI with creatinine of 3.04, sodium of 131, glucose of 120, BUN of 54, lactic acid was 1.7, WBC count 16.1, hemoglobin of 10.9.  Blood cultures were drawn and are pending. EKG shows sinus rhythm with prolonged PR interval. Orthopedics consulted , Dr. Sharol Given requested to transfer the patient to [facility] for possible I&D on Friday. She was referred to medical service/[facility] for evaluation and management of chronic nonhealing lower extremity venous ulcers.  Assessment & Plan:   Active Problems:   Asthma   Venous (peripheral) insufficiency   Morbid obesity (HCC)   Anemia of chronic disease   Acute kidney injury ([place])   CKD (chronic kidney disease), stage III (HCC)   H/O skin graft   Non-healing ulcer (HCC)   Nonhealing bilateral lower extremity venous stasis ulcers, POA Continue vancomycin, Zosyn Pending orthopedic evaluation; Dr Sharol Given to follow  History of nonischemic cardiomyopathy status post ICD Continue to hold patient's home furosemide given AKI as  below  Hypertension Well-controlled continue with Coreg at the same dose.  Acute on stage III CKD Probably secondary to ATN versus poor oral intake/dehydration Continue IV fluids Creatinine currently 3.04, baseline appears to be around 1.2/1.3  Anemia of chronic disease Baseline hemoglobin appears to be around 10 and stable. At baseline  Asthma, not in acute exacerbation Follow clinically  DVT prophylaxis: Lovenox Code Status: Full Disposition Plan: Pending clinical improvement, surgical evaluation and planned procedure.  Continues to require IV antibiotics, further surgical evaluation and procedure in the next 24 to 48 hours.  Consultants:   Orthopedic surgery  Procedures:   Pending possible debridement  Subjective: No acute issues or events overnight, pain currently well controlled, denies chest pain, shortness of breath, nausea, vomiting, diarrhea, constipation, headache, fevers, chills.  Objective: Vitals:   08/21/19 0514 08/21/19 1605 08/21/19 2117 08/22/19 0505  BP: (!) 94/55 (!) 97/55 (!) 97/57 106/70  Pulse: 66 60 67 64  Resp: 16  18 18   Temp: 99.4 F (37.4 C) 97.7 F (36.5 C) 97.7 F (36.5 C) 98.5 F (36.9 C)  TempSrc: Axillary Oral Oral Oral  SpO2: 98% 99% 100% 99%  Weight:      Height:        Intake/Output Summary (Last 24 hours) at 08/22/2019 0810 Last data filed at 08/22/2019 0600 Gross per 24 hour  Intake 1978.28 ml  Output -  Net 1978.28 ml   Filed Weights   08/19/19 1110 08/19/19 1823 08/20/19 0600  Weight: 135.6 kg 125.1 kg 125.8 kg    Examination:  General exam: Appears calm and comfortable  Respiratory system: Clear to auscultation.  Respiratory effort normal. Cardiovascular system: S1 & S2 heard, RRR. No JVD, murmurs, rubs, gallops or clicks. No pedal edema. Gastrointestinal system: Abdomen is nondistended, soft and nontender. No organomegaly or masses felt. Normal bowel sounds heard. Central nervous system: Alert and oriented. No  focal neurological deficits. Extremities: Symmetric 5 x 5 power. Skin: . 12 cm/ 8 cm  necrotic venous stasis foul smelling, pus, draining ulcers ont he extensor aspects of both legs.  Psychiatry: Judgement and insight appear normal. Mood & affect appropriate.   Data Reviewed: I have personally reviewed following labs and imaging studies  CBC: Recent Labs  Lab 08/19/19 1112 08/20/19 2302 08/21/19 0611 08/22/19 0212  WBC 16.1* 11.2* 9.0 7.9  NEUTROABS 13.2*  --   --   --   HGB 10.9* 8.8* 8.2* 8.6*  HCT 35.7* 28.6* 26.8* 28.8*  MCV 92.5 90.8 91.5 93.2  PLT 375 323 289 671   Basic Metabolic Panel: Recent Labs  Lab 08/19/19 1112 08/21/19 0611 08/22/19 0212  NA 131* 138 140  K 3.8 3.3* 3.3*  CL 94* 107 110  CO2 22 22 23   GLUCOSE 120* 113* 102*  BUN 54* 26* 15  CREATININE 3.04* 1.35* 1.30*  CALCIUM 9.4 8.4* 8.3*   Liver Function Tests: Recent Labs  Lab 08/19/19 1112  AST 12*  ALT 13  ALKPHOS 88  BILITOT 1.2  PROT 10.3*  ALBUMIN 2.9*   Sepsis Labs: Recent Labs  Lab 08/19/19 1115  LATICACIDVEN 1.7    Recent Results (from the past 240 hour(s))  Culture, blood (routine x 2)     Status: None (Preliminary result)   Collection Time: 08/19/19 11:27 AM   Specimen: BLOOD  Result Value Ref Range Status   Specimen Description   Final    BLOOD RIGHT ANTECUBITAL Performed at [facility], [place] [address], [place], [place] [address]    Special Requests   Final    BOTTLES DRAWN AEROBIC AND ANAEROBIC Blood Culture adequate volume Performed at [place] [address] [place], [place] [address]    Culture   Final    NO GROWTH 2 DAYS Performed at [place] [address] [place], [place] [address]    Report Status PENDING  Incomplete  SARS CORONAVIRUS 2 (TAT 6-12 HRS) Nasal Swab Aptima Multi Swab     Status: None   Collection Time: 08/19/19 11:28 AM   Specimen: Aptima Multi Swab; Nasal Swab  Result Value Ref Range Status   SARS  Coronavirus 2 NEGATIVE NEGATIVE Final    Comment: (NOTE) SARS-CoV-2 target nucleic acids are NOT DETECTED. The SARS-CoV-2 RNA is generally detectable in upper and lower respiratory specimens during the acute phase of infection. Negative results do not preclude SARS-CoV-2 infection, do not rule out co-infections with other pathogens, and should not be used as the sole basis for treatment or other patient management decisions. Negative results must be combined with clinical observations, patient history, and epidemiological information. The expected result is Negative. Fact Sheet for Patients: SugarRoll.be Fact Sheet for Healthcare Providers: https://www.woods-mathews.com/ This test is not yet approved or cleared by the [place] FDA and  has been authorized for detection and/or diagnosis of SARS-CoV-2 by FDA under an Emergency Use Authorization (EUA). This EUA will remain  in effect (meaning this test can be used) for the duration of the COVID-19 declaration under Section 56 4(b)(1) of the Act, 21 U.S.C. section 360bbb-3(b)(1), unless the authorization is terminated or revoked sooner. Performed at [facility], [place] [address], [place], [place] [address]   Culture, blood (routine x 2)     Status: None (Preliminary result)   Collection Time: 08/19/19 11:32 AM   Specimen: BLOOD  Result Value Ref Range Status   Specimen Description   Final    BLOOD LEFT ANTECUBITAL Performed at [facility], [place] [address], [place], [place] [address]    Special Requests   Final    BOTTLES DRAWN AEROBIC AND ANAEROBIC Blood Culture adequate volume Performed at [facility] [address], [place], [place] [address]    Culture   Final    NO GROWTH 2 DAYS Performed at [facility] [address], [place], [place] [address]    Report Status PENDING  Incomplete    Radiology Studies: No results found.  Scheduled  Meds: . allopurinol  200 mg Oral Daily  . carvedilol  6.25 mg Oral BID  . enoxaparin (LOVENOX) injection  60 mg Subcutaneous Q24H   Continuous Infusions: . sodium chloride 100 mL/hr at 08/22/19 0600  . sodium chloride    . piperacillin-tazobactam (ZOSYN)  IV 3.375 g (08/22/19 0550)  . vancomycin 1,250 mg (08/21/19 1216)     LOS: 3 days   Time spent: 19min   C , DO [facility]  If 7PM-7AM, please contact night-coverage [email or web address] Password [email or web address] 08/22/2019, 8:10 AM

## 2019-08-22 NOTE — Anesthesia Preprocedure Evaluation (Addendum)
Anesthesia Evaluation  Patient identified by MRN, date of birth, ID band Patient awake    Reviewed: Allergy & Precautions, NPO status , Patient's Chart, lab work & pertinent test results, reviewed documented beta blocker date and time   History of Anesthesia Complications Negative for: history of anesthetic complications  Airway Mallampati: II  TM Distance: >3 FB Neck ROM: Full    Dental  (+) Edentulous Lower, Edentulous Upper   Pulmonary asthma ,   Sarcoidosis    Pulmonary exam normal breath sounds clear to auscultation       Cardiovascular hypertension, Pt. on medications and Pt. on home beta blockers + Past MI and +CHF  + Cardiac Defibrillator + Valvular Problems/Murmurs MR  Rhythm:Regular Rate:Normal + Systolic murmurs  '18 Cath - Left dominant coronary anatomy. Normal coronary arteries. Mild to moderate global left ventricular hypo-kinesis with estimated EF 35-40%. LV end-diastolic pressure is up and normal. Combined systolic and diastolic heart failure, chronic  '18 TTE - EF 40-45%. Diffuse hypokinesis worse in the inferior basal wall. LV cavity size was moderately dilated. Moderate LVH. Mild AI. Moderate MR. Moderately dilated LA.     Neuro/Psych  Headaches, PSYCHIATRIC DISORDERS Depression    GI/Hepatic negative GI ROS, Neg liver ROS,   Endo/Other  negative endocrine ROS  Renal/GU CRFRenal disease     Musculoskeletal  (+) Arthritis ,   Abdominal   Peds  Hematology negative hematology ROS (+) anemia ,   Anesthesia Other Findings Day of surgery medications reviewed with the patient.  Reproductive/Obstetrics                            Anesthesia Physical  Anesthesia Plan  ASA: IV  Anesthesia Plan: General   Post-op Pain Management:    Induction: Intravenous  PONV Risk Score and Plan: 3 and Treatment may vary due to age or medical condition, Ondansetron, Dexamethasone  and Midazolam  Airway Management Planned: LMA  Additional Equipment: None  Intra-op Plan:   Post-operative Plan: Extubation in OR  Informed Consent: I have reviewed the patients History and Physical, chart, labs and discussed the procedure including the risks, benefits and alternatives for the proposed anesthesia with the patient or authorized representative who has indicated his/her understanding and acceptance.     Dental advisory given  Plan Discussed with: CRNA  Anesthesia Plan Comments:        Anesthesia Quick Evaluation

## 2019-08-23 ENCOUNTER — Inpatient Hospital Stay (HOSPITAL_COMMUNITY): Payer: Medicare Other | Admitting: Anesthesiology

## 2019-08-23 ENCOUNTER — Encounter (HOSPITAL_COMMUNITY)
Admission: EM | Disposition: A | Discharge status: Home Health | Payer: Self-pay | Source: Home / Self Care | Attending: Internal Medicine

## 2019-08-23 DIAGNOSIS — L03115 Cellulitis of right lower limb: Secondary | ICD-10-CM

## 2019-08-23 DIAGNOSIS — L03116 Cellulitis of left lower limb: Secondary | ICD-10-CM

## 2019-08-23 DIAGNOSIS — L98493 Non-pressure chronic ulcer of skin of other sites with necrosis of muscle: Secondary | ICD-10-CM

## 2019-08-23 DIAGNOSIS — S81809A Unspecified open wound, unspecified lower leg, initial encounter: Secondary | ICD-10-CM

## 2019-08-23 DIAGNOSIS — I872 Venous insufficiency (chronic) (peripheral): Secondary | ICD-10-CM

## 2019-08-23 HISTORY — PX: I & D EXTREMITY: SHX5045

## 2019-08-23 LAB — CBC
HCT: 28.2 % — ABNORMAL LOW (ref 36.0–46.0)
Hemoglobin: 8.5 g/dL — ABNORMAL LOW (ref 12.0–15.0)
MCH: 28 pg (ref 26.0–34.0)
MCHC: 30.1 g/dL (ref 30.0–36.0)
MCV: 92.8 fL (ref 80.0–100.0)
Platelets: 304 10*3/uL (ref 150–400)
RBC: 3.04 MIL/uL — ABNORMAL LOW (ref 3.87–5.11)
RDW: 14.9 % (ref 11.5–15.5)
WBC: 7.7 10*3/uL (ref 4.0–10.5)
nRBC: 0 % (ref 0.0–0.2)

## 2019-08-23 LAB — BASIC METABOLIC PANEL
Anion gap: 9 (ref 5–15)
BUN: 9 mg/dL (ref 6–20)
CO2: 22 mmol/L (ref 22–32)
Calcium: 8.4 mg/dL — ABNORMAL LOW (ref 8.9–10.3)
Chloride: 108 mmol/L (ref 98–111)
Creatinine, Ser: 1.04 mg/dL — ABNORMAL HIGH (ref 0.44–1.00)
GFR calc Af Amer: >60 mL/min (ref >60)
GFR calc non Af Amer: 60 mL/min — ABNORMAL LOW (ref >60)
Glucose, Bld: 106 mg/dL — ABNORMAL HIGH (ref 70–99)
Potassium: 3.2 mmol/L — ABNORMAL LOW (ref 3.5–5.1)
Sodium: 139 mmol/L (ref 135–145)

## 2019-08-23 SURGERY — IRRIGATION AND DEBRIDEMENT EXTREMITY
Anesthesia: General | Site: Leg Lower | Laterality: Bilateral

## 2019-08-23 MED ORDER — MIDAZOLAM HCL 2 MG/2ML IJ SOLN
INTRAMUSCULAR | Status: DC | PRN
  Administered 2019-08-23 (×2): 1 mg via INTRAVENOUS

## 2019-08-23 MED ORDER — METHOCARBAMOL 500 MG PO TABS
ORAL_TABLET | ORAL | Status: AC
  Filled 2019-08-23: qty 1

## 2019-08-23 MED ORDER — FENTANYL CITRATE (PF) 250 MCG/5ML IJ SOLN
INTRAMUSCULAR | Status: AC
  Filled 2019-08-23: qty 5

## 2019-08-23 MED ORDER — ONDANSETRON HCL 4 MG/2ML IJ SOLN
INTRAMUSCULAR | Status: AC
  Filled 2019-08-23: qty 2

## 2019-08-23 MED ORDER — DOCUSATE SODIUM 100 MG PO CAPS
100.0000 mg | ORAL_CAPSULE | Freq: Two times a day (BID) | ORAL | Status: DC
  Administered 2019-08-23 (×2): 100 mg via ORAL
  Filled 2019-08-23 (×7): qty 1

## 2019-08-23 MED ORDER — 0.9 % SODIUM CHLORIDE (POUR BTL) OPTIME
TOPICAL | Status: DC | PRN
  Administered 2019-08-23 (×2): 1000 mL

## 2019-08-23 MED ORDER — PROPOFOL 10 MG/ML IV BOLUS
INTRAVENOUS | Status: DC | PRN
  Administered 2019-08-23: 150 mg via INTRAVENOUS

## 2019-08-23 MED ORDER — METOCLOPRAMIDE HCL 5 MG/ML IJ SOLN: 5.0000 mg | Freq: Three times a day (TID) | INTRAMUSCULAR | Status: DC | PRN

## 2019-08-23 MED ORDER — ONDANSETRON HCL 4 MG/2ML IJ SOLN: 4.0000 mg | Freq: Four times a day (QID) | INTRAMUSCULAR | Status: DC | PRN

## 2019-08-23 MED ORDER — ALBUTEROL SULFATE HFA 108 (90 BASE) MCG/ACT IN AERS
INHALATION_SPRAY | RESPIRATORY_TRACT | Status: DC | PRN
  Administered 2019-08-23: 2 via RESPIRATORY_TRACT

## 2019-08-23 MED ORDER — SODIUM CHLORIDE 0.9 % IV SOLN
INTRAVENOUS | Status: DC
  Administered 2019-08-23: 10:00:00 via INTRAVENOUS

## 2019-08-23 MED ORDER — METOCLOPRAMIDE HCL 5 MG PO TABS: 5.0000 mg | ORAL_TABLET | Freq: Three times a day (TID) | ORAL | Status: DC | PRN

## 2019-08-23 MED ORDER — LIDOCAINE 2% (20 MG/ML) 5 ML SYRINGE
INTRAMUSCULAR | Status: DC | PRN
  Administered 2019-08-23: 100 mg via INTRAVENOUS

## 2019-08-23 MED ORDER — PROPOFOL 10 MG/ML IV BOLUS
INTRAVENOUS | Status: AC
  Filled 2019-08-23: qty 20

## 2019-08-23 MED ORDER — POLYETHYLENE GLYCOL 3350 17 G PO PACK: 17.0000 g | PACK | Freq: Every day | ORAL | Status: DC | PRN

## 2019-08-23 MED ORDER — ACETAMINOPHEN 10 MG/ML IV SOLN
INTRAVENOUS | Status: AC
  Filled 2019-08-23: qty 100

## 2019-08-23 MED ORDER — OXYCODONE HCL 5 MG PO TABS
10.0000 mg | ORAL_TABLET | ORAL | Status: DC | PRN
  Administered 2019-08-23: 10 mg via ORAL
  Administered 2019-08-23: 15 mg via ORAL
  Administered 2019-08-24 – 2019-08-27 (×8): 10 mg via ORAL
  Filled 2019-08-23: qty 3
  Filled 2019-08-23: qty 2

## 2019-08-23 MED ORDER — FENTANYL CITRATE (PF) 100 MCG/2ML IJ SOLN
INTRAMUSCULAR | Status: AC
  Filled 2019-08-23: qty 2

## 2019-08-23 MED ORDER — EPHEDRINE SULFATE-NACL 50-0.9 MG/10ML-% IV SOSY
PREFILLED_SYRINGE | INTRAVENOUS | Status: DC | PRN
  Administered 2019-08-23 (×2): 10 mg via INTRAVENOUS

## 2019-08-23 MED ORDER — ACETAMINOPHEN 325 MG PO TABS: 325.0000 mg | ORAL_TABLET | Freq: Four times a day (QID) | ORAL | Status: DC | PRN

## 2019-08-23 MED ORDER — BISACODYL 10 MG RE SUPP: 10.0000 mg | Freq: Every day | RECTAL | Status: DC | PRN

## 2019-08-23 MED ORDER — FENTANYL CITRATE (PF) 100 MCG/2ML IJ SOLN
25.0000 ug | INTRAMUSCULAR | Status: DC | PRN
  Administered 2019-08-23 (×2): 50 ug via INTRAVENOUS

## 2019-08-23 MED ORDER — ONDANSETRON HCL 4 MG PO TABS: 4.0000 mg | ORAL_TABLET | Freq: Four times a day (QID) | ORAL | Status: DC | PRN

## 2019-08-23 MED ORDER — LIDOCAINE 2% (20 MG/ML) 5 ML SYRINGE
INTRAMUSCULAR | Status: AC
  Filled 2019-08-23: qty 5

## 2019-08-23 MED ORDER — METHOCARBAMOL 1000 MG/10ML IJ SOLN
500.0000 mg | Freq: Four times a day (QID) | INTRAVENOUS | Status: DC | PRN
  Filled 2019-08-23: qty 5

## 2019-08-23 MED ORDER — ACETAMINOPHEN 10 MG/ML IV SOLN
INTRAVENOUS | Status: DC | PRN
  Administered 2019-08-23: 1000 mg via INTRAVENOUS

## 2019-08-23 MED ORDER — METHOCARBAMOL 500 MG PO TABS
500.0000 mg | ORAL_TABLET | Freq: Four times a day (QID) | ORAL | Status: DC | PRN
  Administered 2019-08-23 – 2019-08-25 (×3): 500 mg via ORAL
  Filled 2019-08-23 (×3): qty 1

## 2019-08-23 MED ORDER — PHENYLEPHRINE 40 MCG/ML (10ML) SYRINGE FOR IV PUSH (FOR BLOOD PRESSURE SUPPORT)
PREFILLED_SYRINGE | INTRAVENOUS | Status: AC
  Filled 2019-08-23: qty 10

## 2019-08-23 MED ORDER — FENTANYL CITRATE (PF) 250 MCG/5ML IJ SOLN
INTRAMUSCULAR | Status: DC | PRN
  Administered 2019-08-23: 25 ug via INTRAVENOUS
  Administered 2019-08-23: 50 ug via INTRAVENOUS
  Administered 2019-08-23: 25 ug via INTRAVENOUS
  Administered 2019-08-23: 50 ug via INTRAVENOUS
  Administered 2019-08-23: 25 ug via INTRAVENOUS
  Administered 2019-08-23: 50 ug via INTRAVENOUS
  Administered 2019-08-23: 25 ug via INTRAVENOUS

## 2019-08-23 MED ORDER — ALBUTEROL SULFATE HFA 108 (90 BASE) MCG/ACT IN AERS
INHALATION_SPRAY | RESPIRATORY_TRACT | Status: AC
  Filled 2019-08-23: qty 6.7

## 2019-08-23 MED ORDER — ONDANSETRON HCL 4 MG/2ML IJ SOLN
INTRAMUSCULAR | Status: DC | PRN
  Administered 2019-08-23: 4 mg via INTRAVENOUS

## 2019-08-23 MED ORDER — DEXAMETHASONE SODIUM PHOSPHATE 10 MG/ML IJ SOLN
INTRAMUSCULAR | Status: DC | PRN
  Administered 2019-08-23: 4 mg via INTRAVENOUS

## 2019-08-23 MED ORDER — PHENYLEPHRINE 40 MCG/ML (10ML) SYRINGE FOR IV PUSH (FOR BLOOD PRESSURE SUPPORT)
PREFILLED_SYRINGE | INTRAVENOUS | Status: DC | PRN
  Administered 2019-08-23: 80 ug via INTRAVENOUS
  Administered 2019-08-23: 120 ug via INTRAVENOUS

## 2019-08-23 MED ORDER — LACTATED RINGERS IV SOLN
INTRAVENOUS | Status: DC | PRN
  Administered 2019-08-23: 07:00:00 via INTRAVENOUS

## 2019-08-23 MED ORDER — MIDAZOLAM HCL 2 MG/2ML IJ SOLN
INTRAMUSCULAR | Status: AC
  Filled 2019-08-23: qty 2

## 2019-08-23 MED ORDER — ONDANSETRON HCL 4 MG/2ML IJ SOLN: 4.0000 mg | Freq: Once | INTRAMUSCULAR | Status: DC | PRN

## 2019-08-23 MED ORDER — MAGNESIUM CITRATE PO SOLN: 1.0000 | Freq: Once | ORAL | Status: DC | PRN

## 2019-08-23 MED ORDER — OXYCODONE HCL 5 MG PO TABS
ORAL_TABLET | ORAL | Status: AC
  Filled 2019-08-23: qty 2

## 2019-08-23 MED ORDER — DEXAMETHASONE SODIUM PHOSPHATE 10 MG/ML IJ SOLN
INTRAMUSCULAR | Status: AC
  Filled 2019-08-23: qty 1

## 2019-08-23 MED ORDER — OXYCODONE HCL 5 MG PO TABS
5.0000 mg | ORAL_TABLET | ORAL | Status: DC | PRN
  Administered 2019-08-25: 10 mg via ORAL
  Filled 2019-08-23 (×9): qty 2

## 2019-08-23 MED ORDER — HYDROMORPHONE HCL 1 MG/ML IJ SOLN
0.5000 mg | INTRAMUSCULAR | Status: DC | PRN
  Administered 2019-08-23 – 2019-08-26 (×3): 1 mg via INTRAVENOUS
  Filled 2019-08-23 (×3): qty 1

## 2019-08-23 SURGICAL SUPPLY — 36 items
BLADE SURG 21 STRL SS (BLADE) ×3 IMPLANT
BNDG COHESIVE 6X5 TAN STRL LF (GAUZE/BANDAGES/DRESSINGS) ×4 IMPLANT
BNDG GAUZE ELAST 4 BULKY (GAUZE/BANDAGES/DRESSINGS) ×2 IMPLANT
CANISTER WOUNDNEG PRESSURE 500 (CANNISTER) ×4 IMPLANT
COVER SURGICAL LIGHT HANDLE (MISCELLANEOUS) ×4 IMPLANT
COVER WAND RF STERILE (DRAPES) ×1 IMPLANT
DRAPE U-SHAPE 47X51 STRL (DRAPES) ×3 IMPLANT
DRESSING VERAFLO CLEANSE CC (GAUZE/BANDAGES/DRESSINGS) IMPLANT
DRSG ADAPTIC 3X8 NADH LF (GAUZE/BANDAGES/DRESSINGS) ×3 IMPLANT
DRSG VERAFLO CLEANSE CC (GAUZE/BANDAGES/DRESSINGS) ×6
DURAPREP 26ML APPLICATOR (WOUND CARE) ×5 IMPLANT
ELECT REM PT RETURN 9FT ADLT (ELECTROSURGICAL) ×3
ELECTRODE REM PT RTRN 9FT ADLT (ELECTROSURGICAL) IMPLANT
GAUZE SPONGE 4X4 12PLY STRL (GAUZE/BANDAGES/DRESSINGS) ×1 IMPLANT
GLOVE BIOGEL PI IND STRL 9 (GLOVE) ×1 IMPLANT
GLOVE BIOGEL PI INDICATOR 9 (GLOVE) ×2
GLOVE SURG ORTHO 9.0 STRL STRW (GLOVE) ×3 IMPLANT
GOWN STRL REUS W/ TWL XL LVL3 (GOWN DISPOSABLE) ×2 IMPLANT
GOWN STRL REUS W/TWL XL LVL3 (GOWN DISPOSABLE) ×4
HANDPIECE INTERPULSE COAX TIP (DISPOSABLE)
KIT BASIN OR (CUSTOM PROCEDURE TRAY) ×3 IMPLANT
KIT TURNOVER KIT B (KITS) ×3 IMPLANT
MANIFOLD NEPTUNE II (INSTRUMENTS) ×1 IMPLANT
NS IRRIG 1000ML POUR BTL (IV SOLUTION) ×3 IMPLANT
PACK ORTHO EXTREMITY (CUSTOM PROCEDURE TRAY) ×3 IMPLANT
PAD ARMBOARD 7.5X6 YLW CONV (MISCELLANEOUS) ×6 IMPLANT
PAD NEG PRESSURE SENSATRAC (MISCELLANEOUS) ×4 IMPLANT
SET HNDPC FAN SPRY TIP SCT (DISPOSABLE) IMPLANT
SPONGE LAP 18X18 X RAY DECT (DISPOSABLE) ×2 IMPLANT
STOCKINETTE IMPERVIOUS 9X36 MD (GAUZE/BANDAGES/DRESSINGS) ×4 IMPLANT
SWAB COLLECTION DEVICE MRSA (MISCELLANEOUS) ×1 IMPLANT
SWAB CULTURE ESWAB REG 1ML (MISCELLANEOUS) IMPLANT
TOWEL GREEN STERILE (TOWEL DISPOSABLE) ×3 IMPLANT
TUBE CONNECTING 12'X1/4 (SUCTIONS) ×1
TUBE CONNECTING 12X1/4 (SUCTIONS) ×2 IMPLANT
YANKAUER SUCT BULB TIP NO VENT (SUCTIONS) ×3 IMPLANT

## 2019-08-23 NOTE — Anesthesia Postprocedure Evaluation (Signed)
Anesthesia Post Note  Patient: LAKIN RHINE  Procedure(s) Performed: DEBRIDEMENT EXTREMITY (Bilateral Leg Lower)     Patient location during evaluation: PACU Anesthesia Type: General Level of consciousness: awake and alert Pain management: pain level controlled Vital Signs Assessment: post-procedure vital signs reviewed and stable Respiratory status: spontaneous breathing, nonlabored ventilation, respiratory function stable and patient connected to nasal cannula oxygen Cardiovascular status: blood pressure returned to baseline and stable Postop Assessment: no apparent nausea or vomiting Anesthetic complications: no    Last Vitals:  Vitals:   08/23/19 0910 08/23/19 0954  BP: 120/76 (!) 94/55  Pulse: 72 67  Resp: 19 16  Temp: 36.8 C 36.9 C  SpO2: 94% 99%    Last Pain:  Vitals:   08/23/19 0954  TempSrc: Oral  PainSc:                  Catalina Gravel

## 2019-08-23 NOTE — Progress Notes (Signed)
PROGRESS NOTE    Kari [name]  IRJ:[id number] DOB: May 29, 1961 DOA: 08/19/2019 PCP: Rutherford Guys, MD   Brief Narrative:  Kari [name] is a 58 y.o. 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 Sharol Given and she was discharged to inpatient rehab presents today from home for worsening lower extremity wounds with increased drainage and foul smell and worsening pain.  Patient denies any fevers or chills, denies chest pain shortness of breath or cough, denies any nausea vomiting abdominal pain, dysuria, hematochezia or hematemesis. She is able to walk with assistance and pain is a limiting factor for ambulation. On arrival to ED she was found to be afebrile normotensive.  Labs revealed AKI with creatinine of 3.04, sodium of 131, glucose of 120, BUN of 54, lactic acid was 1.7, WBC count 16.1, hemoglobin of 10.9.  Blood cultures were drawn and are pending. EKG shows sinus rhythm with prolonged PR interval. Orthopedics consulted , Dr. Sharol Given requested to transfer the patient to [facility] for possible I&D on Friday. She was referred to medical service/[facility] for evaluation and management of chronic nonhealing lower extremity venous ulcers.  Assessment & Plan:   Active Problems:   Asthma   Venous (peripheral) insufficiency   Morbid obesity (HCC)   Anemia of chronic disease   Acute kidney injury ([address])   CKD (chronic kidney disease), stage III (HCC)   H/O skin graft   Non-healing ulcer ([place])   Multiple open wounds of lower leg   Moderate protein-calorie malnutrition (HCC)   Nonhealing bilateral lower extremity venous stasis ulcers, POA Continue vancomycin, [facility], Dr Sharol Given following, debridement with wound VAC placement 08/23/2019  Pain management ongoing Antibiotics likely to be de-escalated pending intraoperative cultures Patient remains without leukocytosis, afebrile  History of nonischemic cardiomyopathy status post ICD Continue to hold patient's home furosemide given AKI as below  Hypertension Well-controlled continue with Coreg at the same dose.  Acute kidney injury on stage III CKD, resolved Probably secondary to ATN versus poor oral intake/dehydration Continue IV fluids Creatinine down to 1.04 (admitted at 3.04), baseline appears to be around 1.2/1.3  Anemia of chronic disease Baseline hemoglobin appears to be around 10 and stable. Near baseline -somewhat downtrending in the setting of daily phlebotomy, suspect postop anemia as well Consider transfusion if less than 7 or symptomatic  Asthma, not in acute exacerbation Follow clinically  DVT prophylaxis: Lovenox Code Status: Full Disposition Plan: Pending clinical improvement, surgical evaluation and planned procedure.  Continues to require IV antibiotics, further surgical evaluation and procedure in the next 24 to 48 hours.  Consultants:   Orthopedic surgery  Procedures:   Pending possible debridement  Subjective: No acute issues or events overnight, pain currently well controlled, denies chest pain, shortness of breath, nausea, vomiting, diarrhea, constipation, headache, fevers, chills.  Objective: Vitals:   08/22/19 0505 08/22/19 1315 08/22/19 2039 08/23/19 0521  BP: 106/70 133/66 (!) 111/53 123/74  Pulse: 64 66 64 63  Resp: 18 18 19 18   Temp: 98.5 F (36.9 C) 98.1 F (36.7 C) 98.8 F (37.1 C) 98.4 F (36.9 C)  TempSrc: Oral Oral Oral Oral  SpO2: 99% 100% 97% 100%  Weight:      Height:        Intake/Output Summary (Last 24 hours) at 08/23/2019  [place] filed at 08/23/2019 0631 Gross per 24 hour  Intake 1824.48 ml  Output 1550 ml  Net 274.48 ml   Filed Weights   08/19/19 1110 08/19/19 1823 08/20/19 0600  Weight:  135.6 kg 125.1 kg 125.8 kg    Examination:  General exam: Appears calm and comfortable  Respiratory system: Clear to auscultation. Respiratory effort normal. Cardiovascular system: S1 & S2 heard, RRR. No JVD, murmurs, rubs, gallops or clicks. No pedal edema. Gastrointestinal system: Abdomen is nondistended, soft and nontender. No organomegaly or masses felt. Normal bowel sounds heard. Central nervous system: Alert and oriented. No focal neurological deficits. Extremities: Symmetric 5 x 5 power. Skin: . 12 cm/ 8 cm  necrotic venous stasis foul smelling, pus, draining ulcers ont he extensor aspects of both legs.  Psychiatry: Judgement and insight appear normal. Mood & affect appropriate.   Data Reviewed: I have personally reviewed following labs and imaging studies  CBC: Recent Labs  Lab 08/19/19 1112 08/20/19 2302 08/21/19 0611 08/22/19 0212 08/23/19 0232  WBC 16.1* 11.2* 9.0 7.9 7.7  NEUTROABS 13.2*  --   --   --   --   HGB 10.9* 8.8* 8.2* 8.6* 8.5*  HCT 35.7* 28.6* 26.8* 28.8* 28.2*  MCV 92.5 90.8 91.5 93.2 92.8  PLT 375 323 289 282 937   Basic Metabolic Panel: Recent Labs  Lab 08/19/19 1112 08/21/19 0611 08/22/19 0212 08/23/19 0232  NA 131* 138 140 139  K 3.8 3.3* 3.3* 3.2*  CL 94* 107 110 108  CO2 22 22 23 22   GLUCOSE 120* 113* 102* 106*  BUN 54* 26* 15 9  CREATININE 3.04* 1.35* 1.30* 1.04*  CALCIUM 9.4 8.4* 8.3* 8.4*   Liver Function Tests: Recent Labs  Lab 08/19/19 1112  AST 12*  ALT 13  ALKPHOS 88  BILITOT 1.2  PROT 10.3*  ALBUMIN 2.9*   Sepsis Labs: Recent Labs  Lab 08/19/19 1115  LATICACIDVEN 1.7    Recent Results (from the past 240 hour(s))  Culture, blood (routine x 2)     Status: None (Preliminary result)   Collection Time: 08/19/19 11:27 AM   Specimen: BLOOD  Result Value Ref Range Status   Specimen Description   Final    BLOOD RIGHT ANTECUBITAL Performed at [facility], [place] [address] [place], [place] [address]    Special  Requests   Final    BOTTLES DRAWN AEROBIC AND ANAEROBIC Blood Culture adequate volume Performed at [facility] [address], [place], [place] [address]    Culture   Final    NO GROWTH 3 DAYS Performed at [facility], [place] [address], [place], [place] [address]    Report Status PENDING  Incomplete  SARS CORONAVIRUS 2 (TAT 6-12 HRS) Nasal Swab Aptima Multi Swab     Status: None   Collection Time: 08/19/19 11:28 AM   Specimen: Aptima Multi Swab; Nasal Swab  Result Value Ref Range Status   SARS Coronavirus 2 NEGATIVE NEGATIVE Final    Comment: (NOTE) SARS-CoV-2 target nucleic acids are NOT DETECTED. The SARS-CoV-2 RNA is generally detectable in upper and lower respiratory specimens during the acute phase of infection. Negative results do not preclude SARS-CoV-2 infection, do not rule out co-infections with other pathogens, and should not be used as the sole basis for treatment or other patient management decisions. Negative results must be combined with clinical observations, patient history, and epidemiological information. The expected result is Negative. Fact Sheet for Patients: SugarRoll.be  Fact Sheet for Healthcare Providers: https://www.woods-mathews.com/ This test is not yet approved or cleared by the [place] FDA and  has been authorized for detection and/or diagnosis of SARS-CoV-2 by FDA under an Emergency Use Authorization (EUA). This EUA will remain  in effect (meaning this test can be used) for the duration of the COVID-19 declaration under Section 56 4(b)(1) of the Act, 21 U.S.C. section 360bbb-3(b)(1), unless the authorization is terminated or revoked sooner. Performed at [facility], [place] [address] [place], [place] [address]   Culture, blood (routine x 2)     Status: None (Preliminary result)   Collection Time: 08/19/19 11:32 AM   Specimen: BLOOD  Result Value Ref Range Status    Specimen Description   Final    BLOOD LEFT ANTECUBITAL Performed at [facility], [place] [address], [place], [place] [address]    Special Requests   Final    BOTTLES DRAWN AEROBIC AND ANAEROBIC Blood Culture adequate volume Performed at [facility] [address] [place], [place] [address]    Culture   Final    NO GROWTH 3 DAYS Performed at [facility], [place] [address] [place], [place] [address]    Report Status PENDING  Incomplete  Surgical pcr screen     Status: None   Collection Time: 08/22/19  2:59 PM   Specimen: Nasal Mucosa; Nasal Swab  Result Value Ref Range Status   MRSA, PCR NEGATIVE NEGATIVE Final   Staphylococcus aureus NEGATIVE NEGATIVE Final    Comment: (NOTE) The Xpert SA Assay (FDA approved for NASAL specimens in patients 22 years of age and older), is one component of a comprehensive surveillance program. It is not intended to diagnose infection nor to guide or monitor treatment. Performed at [facility], [place] [address], [place],  [address]     Radiology Studies: No results found.  Scheduled Meds: . allopurinol  200 mg Oral Daily  . carvedilol  6.25 mg Oral BID  . enoxaparin (LOVENOX) injection  60 mg Subcutaneous Q24H   Continuous Infusions: . sodium chloride 100 mL/hr at 08/22/19 1405  . sodium chloride    . piperacillin-tazobactam (ZOSYN)  IV 3.375 g (08/23/19 0601)  . vancomycin Stopped (08/22/19 1717)     LOS: 4 days   Time spent: 28min  Rashi Giuliani C Saylee Sherrill, DO [facility]  If 7PM-7AM, please contact night-coverage [email or web address] Password [email or web address] 08/23/2019, 7:50 AM

## 2019-08-23 NOTE — Transfer of Care (Signed)
Immediate Anesthesia Transfer of Care Note  Patient: Kari [name]  Procedure(s) Performed: DEBRIDEMENT EXTREMITY (Bilateral Leg Lower)  Patient Location: PACU  Anesthesia Type:General  Level of Consciousness: awake, alert  and patient cooperative  Airway & Oxygen Therapy: Patient Spontanous Breathing and Patient connected to face mask oxygen  Post-op Assessment: Report given to RN and Post -op Vital signs reviewed and stable  Post vital signs: Reviewed and stable  Last Vitals:  Vitals Value Taken Time  BP    Temp    Pulse 74 08/23/19 0847  Resp 20 08/23/19 0847  SpO2 91 % 08/23/19 0847  Vitals shown include unvalidated device data.  Last Pain:  Vitals:   08/23/19 0521  TempSrc: Oral  PainSc:       Patients Stated Pain Goal: 2 (37/48/27 0786)  Complications: No apparent anesthesia complications

## 2019-08-23 NOTE — Interval H&P Note (Signed)
History and Physical Interval Note:  08/23/2019 7:27 AM  Kari [name]  has presented today for surgery, with the diagnosis of infection.  The various methods of treatment have been discussed with the patient and family. After consideration of risks, benefits and other options for treatment, the patient has consented to  Procedure(s): DEBRIDEMENT EXTREMITY (Bilateral) as a surgical intervention.  The patient's history has been reviewed, patient examined, no change in status, stable for surgery.  I have reviewed the patient's chart and labs.  Questions were answered to the patient's satisfaction.     [name]

## 2019-08-23 NOTE — Op Note (Signed)
08/19/2019 - 08/23/2019  8:35 AM  PATIENT:  Kari [name]    PRE-OPERATIVE DIAGNOSIS:  infection of both legs.  POST-OPERATIVE DIAGNOSIS:  Same  PROCEDURE:  DEBRIDEMENT EXTREMITY Excisional debridement of skin and soft tissue fascia and muscle from both legs each wound 20 cm in diameter.  Tissue sent for cultures and Gram stain's.  Application of cleanse choice wound VAC sponges x4  SURGEON:  [name], MD  PHYSICIAN ASSISTANT:None ANESTHESIA:   General  PREOPERATIVE INDICATIONS:  AAISHA SLITER is a  58 y.o. female with a diagnosis of infection who failed conservative measures and elected for surgical management.    The risks benefits and alternatives were discussed with the patient preoperatively including but not limited to the risks of infection, bleeding, nerve injury, cardiopulmonary complications, the need for revision surgery, among others, and the patient was willing to proceed.  OPERATIVE IMPLANTS: Cleanse choice wound VAC sponges x4  @ENCIMAGES @  OPERATIVE FINDINGS: Necrotic skin soft tissue fascia and muscle in both legs with extensive infection  OPERATIVE PROCEDURE: Patient was brought to the operating room and underwent a general anesthetic.  After adequate levels anesthesia were obtained both lower extremities were prepped using DuraPrep draped into a sterile field a timeout was called.  A 21 blade knife was used to excise the 5 cm of the necrotic skin and soft tissue muscle and fascia.  This was excised down to good petechial bleeding.  The tissue from the left leg was sent for Gram stain and cultures.  The wound was irrigated with normal saline and this was wrapped with a sterile gauze.  Attention was then focused on the right leg.  A 21 blade knife was used to excise skin soft tissue muscle and fascia down to good bleeding petechial tissue.  The wound was irrigated with normal saline and wrapped with a dry dressing this tissue was sent in a separate culture  container for Gram stain and cultures.  Attention was then focused back on the left leg to of the cleanse choice wound VAC sponges were used to cover the wound.  This was then covered with [place] this had a good suction fit.  Attention was then focused on the right lower extremity this was also covered with 2 cleanse choice foam sponges wrapped with [place] and this had a good suction fit as well.  Both legs were then overwrapped with Coban.  Each wound was 20 cm in diameter.  Patient was extubated taken to the PACU in stable condition.   DISCHARGE PLANNING:  Antibiotic duration: Continue IV antibiotics anticipate patient will need 4 weeks of antibiotics either oral or IV depending on the cultures.  Weightbearing: Weightbearing as tolerated both lower extremities  Pain medication: Opioid pathway ordered  Dressing care/ Wound VAC: Continue wound VAC  Ambulatory devices: Walker  Discharge to: Plan for return to the operating room for skin graft or further debridement on Wednesday.  Follow-up: In the office 1 week post operative.

## 2019-08-23 NOTE — Progress Notes (Signed)
Pt returned to unit from procedure. AOx4, VSS, complaints of 9/10 pain, wound vac in place to bil lower legs, with ongoing Zosyn IV. Will continue to monitor.

## 2019-08-23 NOTE — Anesthesia Procedure Notes (Signed)
Procedure Name: LMA Insertion Date/Time: 08/23/2019 7:46 AM Performed by: Renato Shin, CRNA Pre-anesthesia Checklist: Patient identified, Emergency Drugs available, Suction available and Patient being monitored Patient Re-evaluated:Patient Re-evaluated prior to induction Oxygen Delivery Method: Circle system utilized Preoxygenation: Pre-oxygenation with 100% oxygen Induction Type: IV induction LMA: LMA inserted LMA Size: 4.0 Number of attempts: 1 Placement Confirmation: positive ETCO2 and breath sounds checked- equal and bilateral Tube secured with: Tape Dental Injury: Teeth and Oropharynx as per pre-operative assessment

## 2019-08-24 ENCOUNTER — Encounter (HOSPITAL_COMMUNITY): Payer: Self-pay | Admitting: Orthopedic Surgery

## 2019-08-24 LAB — BASIC METABOLIC PANEL
Anion gap: 6 (ref 5–15)
BUN: 10 mg/dL (ref 6–20)
CO2: 23 mmol/L (ref 22–32)
Calcium: 8.5 mg/dL — ABNORMAL LOW (ref 8.9–10.3)
Chloride: 109 mmol/L (ref 98–111)
Creatinine, Ser: 1.36 mg/dL — ABNORMAL HIGH (ref 0.44–1.00)
GFR calc Af Amer: 50 mL/min — ABNORMAL LOW (ref >60)
GFR calc non Af Amer: 43 mL/min — ABNORMAL LOW (ref >60)
Glucose, Bld: 156 mg/dL — ABNORMAL HIGH (ref 70–99)
Potassium: 3.9 mmol/L (ref 3.5–5.1)
Sodium: 138 mmol/L (ref 135–145)

## 2019-08-24 LAB — CULTURE, BLOOD (ROUTINE X 2)
Culture: NO GROWTH
Culture: NO GROWTH
Special Requests: ADEQUATE
Special Requests: ADEQUATE

## 2019-08-24 LAB — CBC
HCT: 26.8 % — ABNORMAL LOW (ref 36.0–46.0)
Hemoglobin: 7.9 g/dL — ABNORMAL LOW (ref 12.0–15.0)
MCH: 27.9 pg (ref 26.0–34.0)
MCHC: 29.5 g/dL — ABNORMAL LOW (ref 30.0–36.0)
MCV: 94.7 fL (ref 80.0–100.0)
Platelets: 297 10*3/uL (ref 150–400)
RBC: 2.83 MIL/uL — ABNORMAL LOW (ref 3.87–5.11)
RDW: 14.9 % (ref 11.5–15.5)
WBC: 8 10*3/uL (ref 4.0–10.5)
nRBC: 0 % (ref 0.0–0.2)

## 2019-08-24 LAB — VITAMIN D 25 HYDROXY (VIT D DEFICIENCY, FRACTURES): Vit D, 25-Hydroxy: 26.7 ng/mL — ABNORMAL LOW (ref 30.0–100.0)

## 2019-08-24 LAB — VANCOMYCIN, PEAK: Vancomycin Pk: 32 ug/mL (ref 30–40)

## 2019-08-24 NOTE — Progress Notes (Signed)
Patient ID: Kari [name], female   DOB: April 21, 1961, 58 y.o.   MRN: [id number] Patient is postoperative day 3 debridement massive venous ulcers with necrotic tissue both legs.  Patient has no complaints this morning her wound vacs have approximately 100 cc in each vac.  Culture Gram stains show gram-positive cocci and gram-negative rods.  Sensitivities are pending.  Plan for repeat debridement and possible skin graft on Wednesday.

## 2019-08-24 NOTE — H&P (View-Only) (Signed)
Patient ID: Kari [name], female   DOB: 04/25/1961, 58 y.o.   MRN: [id number] Patient is postoperative day 3 debridement massive venous ulcers with necrotic tissue both legs.  Patient has no complaints this morning her wound vacs have approximately 100 cc in each vac.  Culture Gram stains show gram-positive cocci and gram-negative rods.  Sensitivities are pending.  Plan for repeat debridement and possible skin graft on Wednesday.

## 2019-08-24 NOTE — Plan of Care (Signed)

## 2019-08-24 NOTE — Care Management Important Message (Signed)
Important Message  Patient Details  Name: Kari [name] MRN: [id number] Date of Birth: 05-Jul-1961   Medicare Important Message Given:  Yes     Memory Argue 08/24/2019, 4:34 PM

## 2019-08-24 NOTE — Progress Notes (Signed)
PROGRESS NOTE    Kari [name]  ZOX:[id number] DOB: [birth year] DOA: 08/19/2019 PCP: Rutherford Guys, MD   Brief Narrative:  Kari [name] is a 58 y.o. 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 Sharol Given and she was discharged to inpatient rehab presents today from home for worsening lower extremity wounds with increased drainage and foul smell and worsening pain.  Patient denies any fevers or chills, denies chest pain shortness of breath or cough, denies any nausea vomiting abdominal pain, dysuria, hematochezia or hematemesis. She is able to walk with assistance and pain is a limiting factor for ambulation. On arrival to ED she was found to be afebrile normotensive.  Labs revealed AKI with creatinine of 3.04, sodium of 131, glucose of 120, BUN of 54, lactic acid was 1.7, WBC count 16.1, hemoglobin of 10.9.  Blood cultures were drawn and are pending. EKG shows sinus rhythm with prolonged PR interval. Orthopedics consulted , Dr. Sharol Given requested to transfer the patient to [facility] for possible I&D on Friday. She was referred to medical service/TRH for evaluation and management of chronic nonhealing lower extremity venous ulcers.  Assessment & Plan:   Active Problems:   Asthma   Venous (peripheral) insufficiency   Morbid obesity (HCC)   Anemia of chronic disease   Acute kidney injury ([place])   CKD (chronic kidney disease), stage III (HCC)   H/O skin graft   Non-healing ulcer ([place])   Multiple open wounds of lower leg   Moderate protein-calorie malnutrition (HCC)   Nonhealing bilateral lower extremity venous stasis ulcers, POA Continue vancomycin, Zosyn -de-escalate based on cultures Orthopedics, Dr Sharol Given following, debridement with  wound VAC placement 08/23/2019 -likely reevaluation with further debridement and possible skin graft on 08/26/2019 Pain management ongoing -leave pain well controlled Patient remains without leukocytosis, afebrile -clinically appears quite well  History of nonischemic cardiomyopathy status post ICD Continue to hold patient's home furosemide given AKI as below  Hypertension Well-controlled continue with Coreg at the same dose.  Acute kidney injury on stage III CKD, resolved Probably secondary to ATN versus poor oral intake/dehydration Continue IV fluids while poor p.o. intake perioperatively Creatinine 1.3 (admitted at 3.04), baseline appears to be around 1.2/1.3  Chronic anemia of chronic disease, baseline hemoglobin appears to be around 10 and stable. Near baseline -somewhat downtrending in the setting of daily phlebotomy, suspect postop anemia as well Hemoglobin continues to trend down in the setting of daily phlebotomy currently 7.9 Consider transfusion if less than 7 or symptomatic  Asthma, not in acute exacerbation Follow clinically  DVT prophylaxis: Lovenox Code Status: Full Disposition Plan: Pending further surgical evaluation and procedure, PT, need for ongoing IV antibiotics.  Patient to be reevaluated in the OR on 08/26/2019, suspect an additional 24 to 48 hours postoperative care minimally as well as ongoing PT and OT given bilateral foot infections and the wounds.  Likely to need SNF at discharge given suspected poor ambulatory status.  Consultants:   Orthopedic surgery  Procedures:   Pending possible debridement  Subjective: No acute issues or events overnight, pain currently well controlled, denies chest pain, shortness of breath, nausea, vomiting, diarrhea, constipation, headache, fevers, chills.  Objective: Vitals:   08/23/19 2342 08/24/19 0443 08/24/19 1000 08/24/19 1244  BP: 111/64 (!) 105/57 110/85 108/72  Pulse: 62 64 68 65  Resp: 16 16  18   Temp: 97.6  F (36.4 C) 98.3 F (36.8 C)  98.7 F (37.1 C)  TempSrc: Oral Oral  Oral  SpO2: 100% 98%  97%  Weight:      Height:        Intake/Output Summary (Last 24 hours) at 08/24/2019 1315 Last data filed at 08/24/2019 1014 Gross per 24 hour  Intake 2587.32 ml  Output 1300 ml  Net 1287.32 ml   Filed Weights   08/19/19 1110 08/19/19 1823 08/20/19 0600  Weight: 135.6 kg 125.1 kg 125.8 kg    Examination:  General exam: Appears calm and comfortable  Respiratory system: Clear to auscultation. Respiratory effort normal. Cardiovascular system: S1 & S2 heard, RRR. No JVD, murmurs, rubs, gallops or clicks. No pedal edema. Gastrointestinal system: Abdomen is nondistended, soft and nontender. No organomegaly or masses felt. Normal bowel sounds heard. Central nervous system: Alert and oriented. No focal neurological deficits. Extremities: Bilateral foot bandages clean dry intact, wound VAC without overt leak. Psychiatry: Judgement and insight appear normal. Mood & affect appropriate.   Data Reviewed: I have personally reviewed following labs and imaging studies  CBC: Recent Labs  Lab 08/19/19 1112 08/20/19 2302 08/21/19 0611 08/22/19 0212 08/23/19 0232 08/24/19 0235  WBC 16.1* 11.2* 9.0 7.9 7.7 8.0  NEUTROABS 13.2*  --   --   --   --   --   HGB 10.9* 8.8* 8.2* 8.6* 8.5* 7.9*  HCT 35.7* 28.6* 26.8* 28.8* 28.2* 26.8*  MCV 92.5 90.8 91.5 93.2 92.8 94.7  PLT 375 323 289 282 304 314   Basic Metabolic Panel: Recent Labs  Lab 08/19/19 1112 08/21/19 0611 08/22/19 0212 08/23/19 0232 08/24/19 0235  NA 131* 138 140 139 138  K 3.8 3.3* 3.3* 3.2* 3.9  CL 94* 107 110 108 109  CO2 22 22 23 22 23   GLUCOSE 120* 113* 102* 106* 156*  BUN 54* 26* 15 9 10   CREATININE 3.04* 1.35* 1.30* 1.04* 1.36*  CALCIUM 9.4 8.4* 8.3* 8.4* 8.5*   Liver Function Tests: Recent Labs  Lab 08/19/19 1112  AST 12*  ALT 13  ALKPHOS 88  BILITOT 1.2  PROT 10.3*  ALBUMIN 2.9*   Sepsis Labs: Recent Labs  Lab  08/19/19 1115  LATICACIDVEN 1.7    Recent Results (from the past 240 hour(s))  Culture, blood (routine x 2)     Status: None   Collection Time: 08/19/19 11:27 AM   Specimen: BLOOD  Result Value Ref Range Status   Specimen Description   Final    BLOOD RIGHT ANTECUBITAL Performed at [facility], [address], [place], [place] [address]    Special Requests   Final    BOTTLES DRAWN AEROBIC AND ANAEROBIC Blood Culture adequate volume Performed at [place] [address], [place], [place] [address]    Culture   Final    NO GROWTH 5 DAYS Performed at [facility], [place] [address], [place], [place] [address]    Report Status 08/24/2019 FINAL  Final  SARS CORONAVIRUS 2 (TAT 6-12 HRS) Nasal Swab Aptima Multi Swab     Status: None   Collection Time: 08/19/19 11:28 AM   Specimen: Aptima Multi Swab; Nasal Swab  Result Value Ref Range Status   SARS Coronavirus 2 NEGATIVE NEGATIVE Final    Comment: (NOTE) SARS-CoV-2 target nucleic acids are NOT DETECTED. The SARS-CoV-2 RNA is generally detectable in upper and lower  respiratory specimens during the acute phase of infection. Negative results do not preclude SARS-CoV-2 infection, do not rule out co-infections with other pathogens, and should not be used as the sole basis for treatment or other patient management decisions. Negative results must be combined with clinical observations, patient history, and epidemiological information. The expected result is Negative. Fact Sheet for Patients: SugarRoll.be Fact Sheet for Healthcare Providers: https://www.woods-mathews.com/ This test is not yet approved or cleared by the [place] FDA and  has been authorized for detection and/or diagnosis of SARS-CoV-2 by FDA under an Emergency Use Authorization (EUA). This EUA will remain  in effect (meaning this test can be used) for the duration of the COVID-19 declaration  under Section 56 4(b)(1) of the Act, 21 U.S.C. section 360bbb-3(b)(1), unless the authorization is terminated or revoked sooner. Performed at [facility], [place] [address], [place], [place] [address]   Culture, blood (routine x 2)     Status: None   Collection Time: 08/19/19 11:32 AM   Specimen: BLOOD  Result Value Ref Range Status   Specimen Description   Final    BLOOD LEFT ANTECUBITAL Performed at [facility], [place] [address] [place], [place] [address]    Special Requests   Final    BOTTLES DRAWN AEROBIC AND ANAEROBIC Blood Culture adequate volume Performed at [place] [address] [place], [place] [address]    Culture   Final    NO GROWTH 5 DAYS Performed at [facility], [place] [address], [place], [place] [address]    Report Status 08/24/2019 FINAL  Final  Surgical pcr screen     Status: None   Collection Time: 08/22/19  2:59 PM   Specimen: Nasal Mucosa; Nasal Swab  Result Value Ref Range Status   MRSA, PCR NEGATIVE NEGATIVE Final   Staphylococcus aureus NEGATIVE NEGATIVE Final    Comment: (NOTE) The Xpert SA Assay (FDA approved for NASAL specimens in patients 51 years of age and older), is one component of a comprehensive surveillance program. It is not intended to diagnose infection nor to guide or monitor treatment. Performed at [facility], [place] [address], [place], [place] [address]   Aerobic/Anaerobic Culture (surgical/deep wound)     Status: None (Preliminary result)   Collection Time: 08/23/19  8:28 AM   Specimen: Soft Tissue, Other  Result Value Ref Range Status   Specimen Description TISSUE RIGHT LEG  Final   Special Requests A  Final   Gram Stain   Final    MODERATE WBC PRESENT, PREDOMINANTLY PMN RARE GRAM POSITIVE COCCI IN PAIRS RARE GRAM NEGATIVE RODS    Culture   Final    FEW GRAM NEGATIVE RODS CULTURE REINCUBATED FOR BETTER GROWTH Performed at [facility], [place] [address] [place],  [place] [address]    Report Status PENDING  Incomplete  Aerobic/Anaerobic Culture (surgical/deep wound)     Status: None (Preliminary result)   Collection Time: 08/23/19  8:30 AM   Specimen: Soft Tissue, Other  Result Value Ref Range Status   Specimen Description TISSUE LEFT LEG  Final   Special Requests B  Final   Gram Stain   Final    MODERATE WBC PRESENT, PREDOMINANTLY PMN FEW GRAM POSITIVE COCCI IN PAIRS IN CLUSTERS RARE GRAM NEGATIVE RODS    Culture   Final    FEW PSEUDOMONAS AERUGINOSA CULTURE REINCUBATED FOR BETTER GROWTH Performed at [facility], [place],  [place] [address]    Report Status PENDING  Incomplete    Radiology Studies: No results found.  Scheduled Meds: . allopurinol  200 mg Oral Daily  . carvedilol  6.25 mg Oral BID  . docusate sodium  100 mg Oral BID   Continuous Infusions: . sodium chloride 100 mL/hr at 08/22/19 1405  . sodium chloride    . sodium chloride 10 mL/hr at 08/23/19 1815  . methocarbamol (ROBAXIN) IV    . piperacillin-tazobactam (ZOSYN)  IV 3.375 g (08/24/19 0537)  . vancomycin 1,250 mg (08/24/19 1005)     LOS: 5 days   Time spent: 72min  Jayron Maqueda C Norleen Xie, DO [facility]  If 7PM-7AM, please contact night-coverage [email or web address] Password [email or web address] 08/24/2019, 1:15 PM

## 2019-08-24 NOTE — Social Work (Signed)
CSW acknowledging consult for SNF placement. Will follow for therapy recommendations needed to best determine disposition/for insurance authorization. Per notes pt will have additional procedures on Wednesday.    Westley Hummer, MSW, [place] Work [phone]

## 2019-08-24 NOTE — Evaluation (Signed)
Physical Therapy Evaluation Patient Details Name: Kari [name] MRN: [id number] DOB: 02/21/1961 Today's Date: 08/24/2019   History of Present Illness  Pt is a 58 y.o. female admitted 08/19/19 with worsening BLE wounds and drainage. S/p BLE debridement 8/30. Of note, pt recently admitted 06/2019 with same condition and d/c to CIR. Other PMH includes CHF, HTN, MI, asthma, depression, OA.    Clinical Impression  Pt presents with an overall decrease in functional mobility secondary to above. PTA, pt ambulatory with RW/SPC; lives with sister who assists with ADLs and household tasks. Educ on precautions, positioning, therex, and importance of mobility. Today, pt able to stand and take steps with RW and minA; limited by significant BLE pain. Of note, pt planning for additional surgery 9/2. Pt would benefit from intensive CIR-level therapies to maximize functional mobility and independence prior to return home. If CIR not an option, pt interested in SNF. Will follow acutely to address established goals.     Follow Up Recommendations CIR;SNF;Supervision for mobility/OOB    Equipment Recommendations  (TBD next venue)    Recommendations for Other Services       Precautions / Restrictions Precautions Precautions: Fall;Other (comment) Precaution Comments: BLE wound vacs Restrictions Weight Bearing Restrictions: Yes RLE Weight Bearing: Weight bearing as tolerated LLE Weight Bearing: Weight bearing as tolerated      Mobility  Bed Mobility Overal bed mobility: Needs Assistance Bed Mobility: Supine to Sit     Supine to sit: Supervision;HOB elevated     General bed mobility comments: Significant increased time and effort due to pain; pt reliant on use of bed rails to come to long sit from [facility] and bring hips to EOB; use of bed rails  Transfers Overall transfer level: Needs assistance Equipment used: Rolling walker (2 wheeled) Transfers: Sit to/from Stand Sit to Stand: Min assist          General transfer comment: MinA to assist trunk elevation, heavy reliance on BUE support; limited by pain  Ambulation/Gait Ambulation/Gait assistance: Min guard Gait Distance (Feet): 1 Feet Assistive device: Rolling walker (2 wheeled) Gait Pattern/deviations: Step-to pattern;Antalgic;Wide base of support Gait velocity: Decreased   General Gait Details: Slow, antalgic steps from bed to recliner with RW and min guard; limited by pain  Stairs            Wheelchair Mobility    Modified Rankin (Stroke Patients Only)       Balance Overall balance assessment: Needs assistance   Sitting balance-Leahy Scale: Fair       Standing balance-Leahy Scale: Poor Standing balance comment: Reliant on UE support                             Pertinent Vitals/Pain Pain Assessment: Faces Faces Pain Scale: Hurts even more Pain Location: BLEs Pain Descriptors / Indicators: Grimacing;Guarding Pain Intervention(s): Monitored during session;Repositioned    Home Living Family/patient expects to be discharged to:: Private residence Living Arrangements: Other relatives(sister) Available Help at Discharge: Family;Available 24 hours/day Type of Home: House Home Access: Level entry     Home Layout: Two level Home Equipment: Walker - 2 wheels;Cane - single point;Bedside commode      Prior Function Level of Independence: Needs assistance   Gait / Transfers Assistance Needed: Household distances with [place] and RW  ADL's / Homemaking Assistance Needed: has family help to handle housework and cooking  Comments: does crossword puzzles and spends time with her grandson  Hand Dominance        Extremity/Trunk Assessment   Upper Extremity Assessment Upper Extremity Assessment: Overall WFL for tasks assessed    Lower Extremity Assessment Lower Extremity Assessment: RLE deficits/detail;LLE deficits/detail RLE Deficits / Details: s/p lower leg wound debridement; hip and  knee functionally at least 3/5 RLE Coordination: decreased gross motor LLE Deficits / Details: s/p lower leg wound debridement; hip and knee functionally at least 3/5 LLE Coordination: decreased gross motor       Communication   Communication: No difficulties  Cognition Arousal/Alertness: Awake/alert Behavior During Therapy: WFL for tasks assessed/performed Overall Cognitive Status: Within Functional Limits for tasks assessed                                        General Comments      Exercises     Assessment/Plan    PT Assessment Patient needs continued PT services  PT Problem List Decreased strength;Decreased range of motion;Decreased activity tolerance;Decreased balance;Decreased mobility;Decreased knowledge of use of DME;Pain       PT Treatment Interventions DME instruction;Gait training;Stair training;Functional mobility training;Therapeutic activities;Therapeutic exercise;Balance training;Patient/family education    PT Goals (Current goals can be found in the Care Plan section)  Acute Rehab PT Goals Patient Stated Goal: Home for grandson's birthday PT Goal Formulation: With patient Time For Goal Achievement: 09/07/19 Potential to Achieve Goals: Good    Frequency Min 3X/week   Barriers to discharge        Co-evaluation               AM-PAC PT "6 Clicks" Mobility  Outcome Measure Help needed turning from your back to your side while in a flat bed without using bedrails?: A Little Help needed moving from lying on your back to sitting on the side of a flat bed without using bedrails?: A Little Help needed moving to and from a bed to a chair (including a wheelchair)?: A Little Help needed standing up from a chair using your arms (e.g., wheelchair or bedside chair)?: A Little Help needed to walk in hospital room?: A Little Help needed climbing 3-5 steps with a railing? : A Lot 6 Click Score: 17    End of Session Equipment Utilized During  Treatment: Gait belt Activity Tolerance: Patient tolerated treatment well;Patient limited by pain Patient left: in chair;with call bell/phone within reach;with chair alarm set Nurse Communication: Mobility status PT Visit Diagnosis: Other abnormalities of gait and mobility (R26.89);Pain Pain - part of body: Leg    Time: 4818-5631 PT Time Calculation (min) (ACUTE ONLY): 39 min   Charges:   PT Evaluation $PT Eval Moderate Complexity: 1 Mod PT Treatments $Therapeutic Activity: 23-37 mins      Mabeline Caras, PT, DPT Acute Rehabilitation Services  Pager [phone] Office [place] 08/24/2019, 9:48 AM

## 2019-08-24 NOTE — Progress Notes (Signed)
Pharmacy Antibiotic Note  Kari [name] is a 58 y.o. female admitted on 08/19/2019 with chronic LE ulcers.  Pharmacy has been consulted for Vancomcyin dosing.  ID: Wound infection, chronic LE ulcers with recent I&D with skin grafts on 7/24. massive venous ulcers with necrotic tissue both legs WBC WNL, afebrile, Scr back up 1.36 today  Antimicrobials this admission:  Vancomycin 8/26 >>  Zosyn 8/26 >>  Dose adjustments this admission:  8/27 VR = 18 --> 1250 mg x 1, then resume q24h with improvement in renal fxn   Microbiology results:  8/26 BCx: negative 8/26 COVID: Negative 8/29 Surgical PCR: Negative 8/30 L leg tissue: GPC, GNR 8/30: R leg tissue GPC, GNR   Plan: Zosyn 3.375g IV q 8hrs. Vancomycin 1250mg  q24h (est AUC 463, SCr 1.3). Levels 8/31 + 9/1. 4 wks abx Await speciation of cultures   Height: 5\' 7"  (170.2 cm) Weight: 277 lb 5.4 oz (125.8 kg) IBW/kg (Calculated) : 61.6  Temp (24hrs), Avg:97.9 F (36.6 C), Min:97.6 F (36.4 C), Max:98.3 F (36.8 C)  Recent Labs  Lab 08/19/19 1112 08/19/19 1115 08/20/19 1208 08/20/19 2302 08/21/19 0611 08/22/19 0212 08/23/19 0232 08/24/19 0235  WBC 16.1*  --   --  11.2* 9.0 7.9 7.7 8.0  CREATININE 3.04*  --   --   --  1.35* 1.30* 1.04* 1.36*  LATICACIDVEN  --  1.7  --   --   --   --   --   --   VANCORANDOM  --   --  18  --   --   --   --   --     Estimated Creatinine Clearance: 62.9 mL/min (A) (by C-G formula based on SCr of 1.36 mg/dL (H)).    Allergies  Allergen Reactions  . Tramadol Other (See Comments)    hallucination   [place] Dann S. Alford Highland, PharmD, BCPS Clinical Staff Pharmacist Eilene Ghazi [place] 08/24/2019 9:56 AM

## 2019-08-24 NOTE — Progress Notes (Signed)
Rehab Admissions Coordinator Note:  Patient was screened by Michel Santee for appropriateness for an Inpatient Acute Rehab Consult. Note pt plan for OR tomorrow for repeat debridement and possible skin graft.  Will follow for post op therapy recommendations.   Michel Santee 08/24/2019, 3:49 PM  I can be reached at [phone].

## 2019-08-25 LAB — BASIC METABOLIC PANEL
Anion gap: 6 (ref 5–15)
BUN: 11 mg/dL (ref 6–20)
CO2: 24 mmol/L (ref 22–32)
Calcium: 8.5 mg/dL — ABNORMAL LOW (ref 8.9–10.3)
Chloride: 108 mmol/L (ref 98–111)
Creatinine, Ser: 1.32 mg/dL — ABNORMAL HIGH (ref 0.44–1.00)
GFR calc Af Amer: 52 mL/min — ABNORMAL LOW (ref >60)
GFR calc non Af Amer: 45 mL/min — ABNORMAL LOW (ref >60)
Glucose, Bld: 114 mg/dL — ABNORMAL HIGH (ref 70–99)
Potassium: 3.3 mmol/L — ABNORMAL LOW (ref 3.5–5.1)
Sodium: 138 mmol/L (ref 135–145)

## 2019-08-25 LAB — CBC
HCT: 26.5 % — ABNORMAL LOW (ref 36.0–46.0)
Hemoglobin: 7.9 g/dL — ABNORMAL LOW (ref 12.0–15.0)
MCH: 27.9 pg (ref 26.0–34.0)
MCHC: 29.8 g/dL — ABNORMAL LOW (ref 30.0–36.0)
MCV: 93.6 fL (ref 80.0–100.0)
Platelets: 270 10*3/uL (ref 150–400)
RBC: 2.83 MIL/uL — ABNORMAL LOW (ref 3.87–5.11)
RDW: 15 % (ref 11.5–15.5)
WBC: 8 10*3/uL (ref 4.0–10.5)
nRBC: 0 % (ref 0.0–0.2)

## 2019-08-25 LAB — VANCOMYCIN, TROUGH: Vancomycin Tr: 18 ug/mL (ref 15–20)

## 2019-08-25 LAB — VANCOMYCIN, PEAK: Vancomycin Pk: 41 ug/mL — ABNORMAL HIGH (ref 30–40)

## 2019-08-25 MED ORDER — CHLORHEXIDINE GLUCONATE 4 % EX LIQD
60.0000 mL | Freq: Once | CUTANEOUS | Status: AC
  Administered 2019-08-26: 4 via TOPICAL
  Filled 2019-08-25: qty 60

## 2019-08-25 MED ORDER — DEXTROSE 5 % IV SOLN
3.0000 g | INTRAVENOUS | Status: AC
  Administered 2019-08-26: 14:00:00 3 g via INTRAVENOUS
  Filled 2019-08-25: qty 3

## 2019-08-25 NOTE — Evaluation (Signed)
Occupational Therapy Evaluation Patient Details Name: Kari [name] MRN: [id number] DOB: [birth year] Today's Date: 08/25/2019    History of Present Illness Pt is a 58 y.o. female admitted 08/19/19 with worsening BLE wounds and drainage. S/p BLE debridement 8/30. Of note, pt recently admitted 06/2019 with same condition and d/c to CIR. Other PMH includes CHF, HTN, MI, asthma, depression, OA.   Clinical Impression   PT admitted with BIL LE wounds with drainage. Pt currently with functional limitiations due to the deficits listed below (see OT problem list). Pt currently requires min (A) for sit<>Stand and stand pivot. Pt requires extensive (A) for LB adls. Pt reports pain in L LE the most.  Pt will benefit from skilled OT to increase their independence and safety with adls and balance to allow discharge CIR.     Follow Up Recommendations  CIR    Equipment Recommendations  None recommended by OT    Recommendations for Other Services Rehab consult     Precautions / Restrictions Precautions Precautions: Fall;Other (comment) Precaution Comments: BLE wound vacs Restrictions Weight Bearing Restrictions: No RLE Weight Bearing: Weight bearing as tolerated LLE Weight Bearing: Weight bearing as tolerated      Mobility Bed Mobility Overal bed mobility: Needs Assistance Bed Mobility: Supine to Sit     Supine to sit: Supervision;HOB elevated     General bed mobility comments: required HOB elevated and heavy use of bed rails  Transfers Overall transfer level: Needs assistance Equipment used: Rolling walker (2 wheeled) Transfers: Sit to/from [facility] Sit to Stand: Min assist Stand pivot transfers: Min assist       General transfer comment: requires (A) to power up with elevated surface and pulling on bed rail with R UE.     Balance Overall balance assessment: Needs assistance           Standing balance-Leahy Scale: Poor Standing balance comment: relies  on BIL UE                           ADL either performed or assessed with clinical judgement   ADL Overall ADL's : Needs assistance/impaired Eating/Feeding: Independent   Grooming: Wash/dry face;Wash/dry hands;Oral care;Modified independent;Sitting Grooming Details (indicate cue type and reason): requries sitting Upper Body Bathing: Set up;Sitting   Lower Body Bathing: Maximal assistance           Toilet Transfer: Minimal assistance;RW;Stand-pivot             General ADL Comments: pt agreeable to OOB for luncha nd a bath with tech      Vision         Perception     Praxis      Pertinent Vitals/Pain Pain Assessment: Faces Faces Pain Scale: Hurts little more Pain Location: BLEs Pain Descriptors / Indicators: Grimacing;Guarding Pain Intervention(s): Monitored during session;Premedicated before session;Repositioned     Hand Dominance Right   Extremity/Trunk Assessment Upper Extremity Assessment Upper Extremity Assessment: Overall WFL for tasks assessed       Cervical / Trunk Assessment Cervical / Trunk Assessment: Normal   Communication Communication Communication: No difficulties   Cognition Arousal/Alertness: Awake/alert Behavior During Therapy: WFL for tasks assessed/performed Overall Cognitive Status: Within Functional Limits for tasks assessed                                     General Comments  bil  wound vac    Exercises     Shoulder Instructions      Home Living Family/patient expects to be discharged to:: Private residence Living Arrangements: Other relatives Available Help at Discharge: Family;Available 24 hours/day Type of Home: House Home Access: Level entry     Home Layout: Two level Alternate Level Stairs-Number of Steps: 8 Alternate Level Stairs-Rails: Left Bathroom Shower/Tub: Teacher, early years/pre: Standard Bathroom Accessibility: Yes   Home Equipment: Walker - 2 wheels;Cane -  single point;Bedside commode   Additional Comments: lives with younger sister and sister is there at all times. Pt has a daughter with an 7 yr grandchild that can provided PRN      Prior Functioning/Environment Level of Independence: Needs assistance  Gait / Transfers Assistance Needed: Household distances with [place] and RW ADL's / Homemaking Assistance Needed: has family help to handle housework and cooking   Comments: does crossword puzzles and spends time with her grandson        OT Problem List: Decreased strength;Decreased activity tolerance;Impaired balance (sitting and/or standing);Decreased safety awareness;Decreased knowledge of use of DME or AE;Decreased knowledge of precautions;Pain;Obesity      OT Treatment/Interventions: Self-care/ADL training;Therapeutic exercise;Energy conservation;DME and/or AE instruction;Manual therapy;Therapeutic activities;Patient/family education;Balance training    OT Goals(Current goals can be found in the care plan section) Acute Rehab OT Goals Patient Stated Goal: Home for grandson's birthday OT Goal Formulation: With patient Time For Goal Achievement: 09/08/19 Potential to Achieve Goals: Good  OT Frequency: Min 2X/week   Barriers to D/C:            Co-evaluation              AM-PAC OT "6 Clicks" Daily Activity     Outcome Measure Help from another person eating meals?: None Help from another person taking care of personal grooming?: None Help from another person toileting, which includes using toliet, bedpan, or urinal?: A Little Help from another person bathing (including washing, rinsing, drying)?: A Lot Help from another person to put on and taking off regular upper body clothing?: A Little Help from another person to put on and taking off regular lower body clothing?: A Lot 6 Click Score: 18   End of Session Equipment Utilized During Treatment: Gait belt;Rolling walker Nurse Communication: Mobility  status;Precautions  Activity Tolerance: Patient tolerated treatment well Patient left: in chair;with call bell/phone within reach;with chair alarm set  OT Visit Diagnosis: Unsteadiness on feet (R26.81);Muscle weakness (generalized) (M62.81);Pain Pain - Right/Left: Left Pain - part of body: Leg                Time: 0254-2706 OT Time Calculation (min): 13 min Charges:  OT General Charges $OT Visit: 1 Visit OT Evaluation $OT Eval Moderate Complexity: 1 Mod   Jeri Modena, OTR/L  Acute Rehabilitation Services Pager: [phone] Office: [phone] .   Jeri Modena 08/25/2019, 3:04 PM

## 2019-08-25 NOTE — Progress Notes (Signed)
Physical Therapy Treatment Patient Details Name: Kari [name] MRN: [id number] DOB: 1961-01-28 Today's Date: 08/25/2019    History of Present Illness Pt is a 58 y.o. female admitted 08/19/19 with worsening BLE wounds and drainage. S/p BLE debridement 8/30. Of note, pt recently admitted 06/2019 with same condition and d/c to CIR. Other PMH includes CHF, HTN, MI, asthma, depression, OA.    PT Comments    Pt performed gt training and functional mobility with max cues for encouragement due to anxiety of moving.  Pt continues to be limited due to pain and required frequent rest breaks due to fatigue.  Pt performed LE exercises in supine and required min guard for 15 ft of gt training.  She continues to benefit from aggressive rehab in CIR setting to improve strength and function before returning home.  Pt is very motivated to regain function.     Follow Up Recommendations  CIR;Supervision for mobility/OOB     Equipment Recommendations  (TBD at next venue)    Recommendations for Other Services       Precautions / Restrictions Precautions Precautions: Fall;Other (comment) Precaution Comments: BLE wound vacs Restrictions Weight Bearing Restrictions: No RLE Weight Bearing: Weight bearing as tolerated LLE Weight Bearing: Weight bearing as tolerated    Mobility  Bed Mobility Overal bed mobility: Needs Assistance Bed Mobility: Supine to Sit     Supine to sit: Supervision;HOB elevated     General bed mobility comments: required HOB elevated and heavy use of bed rails  Transfers Overall transfer level: Needs assistance Equipment used: Rolling walker (2 wheeled) Transfers: Sit to/from Stand Sit to Stand: Min guard Stand pivot transfers: Min assist       General transfer comment: Min guard with assistance to manage lines and leads  Ambulation/Gait Ambulation/Gait assistance: Min guard Gait Distance (Feet): 15 Feet Assistive device: Rolling walker (2 wheeled) Gait  Pattern/deviations: Step-to pattern;Shuffle;Wide base of support Gait velocity: Decreased   General Gait Details: Slow waddling antalgic pattern.  Pt limited due to pain and DOE.   Stairs             Wheelchair Mobility    Modified Rankin (Stroke Patients Only)       Balance Overall balance assessment: Needs assistance   Sitting balance-Leahy Scale: Fair       Standing balance-Leahy Scale: Poor Standing balance comment: relies on BIL UE                            Cognition Arousal/Alertness: Awake/alert Behavior During Therapy: WFL for tasks assessed/performed;Anxious Overall Cognitive Status: Within Functional Limits for tasks assessed                                        Exercises General Exercises - Lower Extremity Ankle Circles/Pumps: AROM;Both;20 reps;Supine Quad Sets: AROM;Both;10 reps;Supine Heel Slides: AROM;Both;10 reps;Supine Hip ABduction/ADduction: AROM;Both;10 reps;Supine Straight Leg Raises: AAROM;Both;10 reps;Supine    General Comments General comments (skin integrity, edema, etc.): bil wound vac      Pertinent Vitals/Pain Pain Assessment: Faces Faces Pain Scale: Hurts little more Pain Location: BLEs Pain Descriptors / Indicators: Grimacing;Guarding Pain Intervention(s): Monitored during session;Repositioned    Home Living Family/patient expects to be discharged to:: Private residence Living Arrangements: Other relatives Available Help at Discharge: Family;Available 24 hours/day Type of Home: House Home Access: Level entry   Home Layout: Two  level Home Equipment: Walker - 2 wheels;Cane - single point;Bedside commode Additional Comments: lives with younger sister and sister is there at all times. Pt has a daughter with an 7 yr grandchild that can provided PRN    Prior Function Level of Independence: Needs assistance  Gait / Transfers Assistance Needed: Household distances with [place] and RW ADL's /  Homemaking Assistance Needed: has family help to handle housework and cooking Comments: does crossword puzzles and spends time with her grandson   PT Goals (current goals can now be found in the care plan section) Acute Rehab PT Goals Patient Stated Goal: Home for grandson's birthday Potential to Achieve Goals: Good    Frequency    Min 3X/week      PT Plan Current plan remains appropriate    Co-evaluation              AM-PAC PT "6 Clicks" Mobility   Outcome Measure  Help needed turning from your back to your side while in a flat bed without using bedrails?: A Little Help needed moving from lying on your back to sitting on the side of a flat bed without using bedrails?: A Little Help needed moving to and from a bed to a chair (including a wheelchair)?: A Little Help needed standing up from a chair using your arms (e.g., wheelchair or bedside chair)?: A Little Help needed to walk in hospital room?: A Little Help needed climbing 3-5 steps with a railing? : A Lot 6 Click Score: 17    End of Session Equipment Utilized During Treatment: Gait belt Activity Tolerance: Patient tolerated treatment well;Patient limited by pain Patient left: in chair;with call bell/phone within reach;with chair alarm set Nurse Communication: Mobility status PT Visit Diagnosis: Other abnormalities of gait and mobility (R26.89);Pain Pain - part of body: Leg     Time: 3976-7341 PT Time Calculation (min) (ACUTE ONLY): 33 min  Charges:  $Gait Training: 8-22 mins $Therapeutic Exercise: 8-22 mins                     Kari [name], PTA Acute Rehabilitation Services Pager [phone] Office [phone]     Kari [name] 08/25/2019, 4:59 PM

## 2019-08-25 NOTE — Progress Notes (Signed)
PROGRESS NOTE    Kari [name]  GYK:[id number] DOB: 1961/11/17 DOA: 08/19/2019 PCP: Rutherford Guys, MD   Brief Narrative:  Kari [name] is a 58 y.o. 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 Sharol Given and she was discharged to inpatient rehab presents today from home for worsening lower extremity wounds with increased drainage and foul smell and worsening pain.  Patient denies any fevers or chills, denies chest pain shortness of breath or cough, denies any nausea vomiting abdominal pain, dysuria, hematochezia or hematemesis. She is able to walk with assistance and pain is a limiting factor for ambulation. On arrival to ED she was found to be afebrile normotensive.  Labs revealed AKI with creatinine of 3.04, sodium of 131, glucose of 120, BUN of 54, lactic acid was 1.7, WBC count 16.1, hemoglobin of 10.9.  Blood cultures were drawn and are pending. EKG shows sinus rhythm with prolonged PR interval. Orthopedics consulted , Dr. Sharol Given requested to transfer the [facility] to [facility] for possible [facility]&D on Friday. She was referred to medical service/TRH for evaluation and management of chronic nonhealing lower extremity venous ulcers.  Assessment & Plan:   Active Problems:   Asthma   Venous (peripheral) insufficiency   Morbid obesity (HCC)   Anemia of chronic disease   Acute kidney injury ([place])   CKD (chronic kidney disease), stage III (HCC)   H/O skin graft   Non-healing ulcer ([place])   Multiple open wounds of lower leg   Moderate protein-calorie malnutrition (HCC)   Nonhealing bilateral lower extremity venous stasis ulcers, POA Continue vancomycin, Zosyn -de-escalate based on cultures Orthopedics, Dr Sharol Given following, debridement with  wound VAC placement 08/23/2019 -likely reevaluation with further debridement and possible skin graft on 08/26/2019 Pain management ongoing -leave pain well controlled Patient remains without leukocytosis, afebrile -clinically appears quite well  History of nonischemic cardiomyopathy status post ICD Continue to hold patient's home furosemide given AKI as below  Hypertension Well-controlled continue with Coreg at the same dose.  Acute kidney injury on stage III CKD, resolved Probably secondary to ATN versus poor oral intake/dehydration Continue IV fluids while poor p.o. intake perioperatively Creatinine 1.3 (admitted at 3.04), baseline appears to be around 1.2/1.3  Chronic anemia of chronic disease, baseline hemoglobin appears to be around 10 and stable. Near baseline -somewhat downtrending in the setting of daily phlebotomy, suspect postop anemia as well Hemoglobin continues to trend down in the setting of daily phlebotomy currently 7.9 Consider transfusion if less than 7 or symptomatic  Asthma, not in acute exacerbation Follow clinically  DVT prophylaxis: Lovenox Code Status: Full Disposition Plan: Pending further surgical evaluation and procedure, PT, need for ongoing IV antibiotics.  Patient to be reevaluated in the OR on 08/26/2019, suspect an additional 24 to 48 hours postoperative care minimally as well as ongoing PT and OT given bilateral foot infections and the wounds.  Likely to need SNF at discharge given suspected poor ambulatory status pending PT recommendations.  Consultants:   Orthopedic surgery  Procedures:   Bilateral foot debridement with wound VAC placement 08/23/2019  Tentative repeat procedure 08/26/2019 with further debridement and possible skin graft  Subjective: No acute issues or events overnight, pain currently well controlled, denies chest pain, shortness of breath, nausea, vomiting, diarrhea, constipation, headache, fevers, chills.  Objective: Vitals:    08/24/19 1244 08/24/19 2107 08/25/19 0516 08/25/19 0517  BP: 108/72 102/62 106/62   Pulse: 65 72 (!) 47 60  Resp: 18 18 18    Temp: 98.7 F (37.1 C) 98.6 F (37 C) 98.4 F (36.9 C)   TempSrc: Oral Oral Oral   SpO2: 97% 95% 97% 97%  Weight:      Height:        Intake/Output Summary (Last 24 hours) at 08/25/2019 1336 Last data filed at 08/25/2019 1057 Gross per 24 hour  Intake 337 ml  Output 2000 ml  Net -1663 ml   Filed Weights   08/19/19 1110 08/19/19 1823 08/20/19 0600  Weight: 135.6 kg 125.1 kg 125.8 kg    Examination:  General exam: Appears calm and comfortable  Respiratory system: Clear to auscultation. Respiratory effort normal. Cardiovascular system: S1 & S2 heard, RRR. No JVD, murmurs, rubs, gallops or clicks. No pedal edema. Gastrointestinal system: Abdomen is nondistended, soft and nontender. No organomegaly or masses felt. Normal bowel sounds heard. Central nervous system: Alert and oriented. No focal neurological deficits. Extremities: Bilateral foot bandages clean dry intact, wound VAC without overt leak. Psychiatry: Judgement and insight appear normal. Mood & affect appropriate.   Data Reviewed: I have personally reviewed following labs and imaging studies  CBC: Recent Labs  Lab 08/19/19 1112  08/21/19 0611 08/22/19 0212 08/23/19 0232 08/24/19 0235 08/25/19 0159  WBC 16.1*   < > 9.0 7.9 7.7 8.0 8.0  NEUTROABS 13.2*  --   --   --   --   --   --   HGB 10.9*   < > 8.2* 8.6* 8.5* 7.9* 7.9*  HCT 35.7*   < > 26.8* 28.8* 28.2* 26.8* 26.5*  MCV 92.5   < > 91.5 93.2 92.8 94.7 93.6  PLT 375   < > 289 282 304 297 270   < > = values in this interval not displayed.   Basic Metabolic Panel: Recent Labs  Lab 08/21/19 0611 08/22/19 0212 08/23/19 0232 08/24/19 0235 08/25/19 0159  NA 138 140 139 138 138  K 3.3* 3.3* 3.2* 3.9 3.3*  CL 107 110 108 109 108  CO2 22 23 22 23 24   GLUCOSE 113* 102* 106* 156* 114*  BUN 26* 15 9 10 11   CREATININE 1.35* 1.30* 1.04*  1.36* 1.32*  CALCIUM 8.4* 8.3* 8.4* 8.5* 8.5*   Liver Function Tests: Recent Labs  Lab 08/19/19 1112  AST 12*  ALT 13  ALKPHOS 88  BILITOT 1.2  PROT 10.3*  ALBUMIN 2.9*   Sepsis Labs: Recent Labs  Lab 08/19/19 1115  LATICACIDVEN 1.7    Recent Results (from the past 240 hour(s))  Culture, blood (routine x 2)     Status: None   Collection Time: 08/19/19 11:27 AM   Specimen: BLOOD  Result Value Ref Range Status   Specimen Description   Final    BLOOD RIGHT ANTECUBITAL Performed at [facility], [place] [address] [place], [place] [address]    Special Requests   Final    BOTTLES DRAWN AEROBIC AND ANAEROBIC Blood Culture adequate volume Performed at [place] [address] [place], [place] [address]    Culture   Final    NO GROWTH 5 DAYS Performed at [facility], [place] [address], [place], [place] [address]    Report Status 08/24/2019 FINAL  Final  SARS CORONAVIRUS 2 (TAT 6-12 HRS) Nasal Swab Aptima Multi Swab  Status: None   Collection Time: 08/19/19 11:28 AM   Specimen: Aptima Multi Swab; Nasal Swab  Result Value Ref Range Status   SARS Coronavirus 2 NEGATIVE NEGATIVE Final    Comment: (NOTE) SARS-CoV-2 target nucleic acids are NOT DETECTED. The SARS-CoV-2 RNA is generally detectable in upper and lower respiratory specimens during the acute phase of infection. Negative results do not preclude SARS-CoV-2 infection, do not rule out co-infections with other pathogens, and should not be used as the sole basis for treatment or other patient management decisions. Negative results must be combined with clinical observations, patient history, and epidemiological information. The expected result is Negative. Fact Sheet for Patients: SugarRoll.be Fact Sheet for Healthcare Providers: https://www.woods-mathews.com/ This test is not yet approved or cleared by the [place] FDA and  has been  authorized for detection and/or diagnosis of SARS-CoV-2 by FDA under an Emergency Use Authorization (EUA). This EUA will remain  in effect (meaning this test can be used) for the duration of the COVID-19 declaration under Section 56 4(b)(1) of the Act, 21 U.S.C. section 360bbb-3(b)(1), unless the authorization is terminated or revoked sooner. Performed at [facility], [place] [address] [place], [place] [address]   Culture, blood (routine x 2)     Status: None   Collection Time: 08/19/19 11:32 AM   Specimen: BLOOD  Result Value Ref Range Status   Specimen Description   Final    BLOOD LEFT ANTECUBITAL Performed at [facility], [place] [address], [place], [place] [address]    Special Requests   Final    BOTTLES DRAWN AEROBIC AND ANAEROBIC Blood Culture adequate volume Performed at [place] [address], [place], [place] [address]    Culture   Final    NO GROWTH 5 DAYS Performed at [facility], [place] [address], [place], [place] [address]    Report Status 08/24/2019 FINAL  Final  Surgical pcr screen     Status: None   Collection Time: 08/22/19  2:59 PM   Specimen: Nasal Mucosa; Nasal Swab  Result Value Ref Range Status   MRSA, PCR NEGATIVE NEGATIVE Final   Staphylococcus aureus NEGATIVE NEGATIVE Final    Comment: (NOTE) The Xpert SA Assay (FDA approved for NASAL specimens in patients 14 years of age and older), is one component of a comprehensive surveillance program. It is not intended to diagnose infection nor to guide or monitor treatment. Performed at [facility], [place] [address], [place], [place] [address]   Aerobic/Anaerobic Culture (surgical/deep wound)     Status: None (Preliminary result)   Collection Time: 08/23/19  8:28 AM   Specimen: Soft Tissue, Other  Result Value Ref Range Status   Specimen Description TISSUE RIGHT LEG  Final   Special Requests A  Final   Gram Stain   Final    MODERATE WBC PRESENT, PREDOMINANTLY  PMN RARE GRAM POSITIVE COCCI IN PAIRS RARE GRAM NEGATIVE RODS    Culture   Final    FEW PSEUDOMONAS AERUGINOSA HOLDING FOR POSSIBLE ANAEROBE Performed at [facility], [address] [place], [place] [address]    Report Status PENDING  Incomplete   Organism ID, Bacteria PSEUDOMONAS AERUGINOSA  Final      Susceptibility   Pseudomonas aeruginosa - MIC*    CEFTAZIDIME 2 SENSITIVE Sensitive     CIPROFLOXACIN 2 INTERMEDIATE Intermediate     GENTAMICIN 2 SENSITIVE Sensitive     IMIPENEM 1 SENSITIVE Sensitive  CEFEPIME 4 SENSITIVE Sensitive     * FEW PSEUDOMONAS AERUGINOSA  Aerobic/Anaerobic Culture (surgical/deep wound)     Status: None (Preliminary result)   Collection Time: 08/23/19  8:30 AM   Specimen: Soft Tissue, Other  Result Value Ref Range Status   Specimen Description TISSUE LEFT LEG  Final   Special Requests B  Final   Gram Stain   Final    MODERATE WBC PRESENT, PREDOMINANTLY PMN FEW GRAM POSITIVE COCCI IN PAIRS IN CLUSTERS RARE GRAM NEGATIVE RODS    Culture   Final    FEW PSEUDOMONAS AERUGINOSA CULTURE REINCUBATED FOR BETTER GROWTH    Report Status PENDING  Incomplete   Organism ID, Bacteria PSEUDOMONAS AERUGINOSA  Final      Susceptibility   Pseudomonas aeruginosa - MIC*    CEFTAZIDIME 2 SENSITIVE Sensitive     CIPROFLOXACIN 1 SENSITIVE Sensitive     GENTAMICIN <=1 SENSITIVE Sensitive     IMIPENEM 2 SENSITIVE Sensitive     PIP/TAZO 8 SENSITIVE Sensitive     CEFEPIME Value in next row Sensitive      <=1 SENSITIVEPerformed at [facility], [address] [place], [place] [address]    * FEW PSEUDOMONAS AERUGINOSA    Radiology Studies: No results found.  Scheduled Meds: . allopurinol  200 mg Oral Daily  . carvedilol  6.25 mg Oral BID  . docusate sodium  100 mg Oral BID   Continuous Infusions: . sodium chloride 100 mL/hr at 08/22/19 1405  . sodium chloride    . sodium chloride 10 mL/hr at 08/23/19 1815  . [START ON 08/26/2019]  ceFAZolin  (ANCEF) IV    . methocarbamol (ROBAXIN) IV    . piperacillin-tazobactam (ZOSYN)  IV 3.375 g (08/25/19 0521)  . vancomycin 1,250 mg (08/25/19 0943)     LOS: 6 days   Time spent: 84min  Telesforo Brosnahan C Izac Faulkenberry, DO [facility]  If 7PM-7AM, please contact night-coverage [email or web address] Password [email or web address] 08/25/2019, 1:36 PM

## 2019-08-25 NOTE — Progress Notes (Signed)
Pharmacy Antibiotic Note  Kari [name] is a 58 y.o. female admitted on 08/19/2019 with chronic LE ulcers.  Pharmacy has been consulted for Vancomcyin dosing.  ID: Wound infection, chronic LE ulcers with recent I&D with skin grafts on 7/24. massive venous ulcers with necrotic tissue both legs WBC WNL, afebrile, Scr back up 1.36 today  Antimicrobials this admission:  Vancomycin 8/26 >>  Zosyn 8/26 >>  Dose adjustments this admission:  8/27 VR = 18 --> 1250 mg x 1, then resume q24h with improvement in renal fxn   Microbiology results:  8/26 BCx: negative 8/26 COVID: Negative 8/29 Surgical PCR: Negative 8/30 L leg tissue: GPC, GNR 8/30: R leg tissue GPC, GNR  9/1: Vanco Peak 32, Vanco Trough 18 (after dose already charted)= AUC 655 (goal 400-550)  Plan: Zosyn 3.375g IV q 8hrs. Vancomycin 1250mg  q24h (est AUC 463, SCr 1.3). Continue same. Recheck peak/trough 9/1 and 9/2 hopefully with better timing. 4 wks abx Await speciation of cultures   Height: 5\' 7"  (170.2 cm) Weight: 277 lb 5.4 oz (125.8 kg) IBW/kg (Calculated) : 61.6  Temp (24hrs), Avg:98.6 F (37 C), Min:98.4 F (36.9 C), Max:98.7 F (37.1 C)  Recent Labs  Lab 08/19/19 1115 08/20/19 1208  08/21/19 1173 08/22/19 0212 08/23/19 0232 08/24/19 0235 08/24/19 1722 08/25/19 0159 08/25/19 1014  WBC  --   --    < > 9.0 7.9 7.7 8.0  --  8.0  --   CREATININE  --   --   --  1.35* 1.30* 1.04* 1.36*  --  1.32*  --   LATICACIDVEN 1.7  --   --   --   --   --   --   --   --   --   VANCOTROUGH  --   --   --   --   --   --   --   --   --  18  VANCOPEAK  --   --   --   --   --   --   --  32  --   --   VANCORANDOM  --  18  --   --   --   --   --   --   --   --    < > = values in this interval not displayed.    Estimated Creatinine Clearance: 64.8 mL/min (A) (by C-G formula based on SCr of 1.32 mg/dL (H)).    Allergies  Allergen Reactions  . Tramadol Other (See Comments)    hallucination   Chamika Cunanan S. Alford Highland,  PharmD, BCPS Clinical Staff Pharmacist Eilene Ghazi [place] 08/25/2019 11:29 AM

## 2019-08-26 ENCOUNTER — Encounter (HOSPITAL_COMMUNITY)
Admission: EM | Disposition: A | Discharge status: Home Health | Payer: Self-pay | Source: Home / Self Care | Attending: Internal Medicine

## 2019-08-26 ENCOUNTER — Inpatient Hospital Stay (HOSPITAL_COMMUNITY): Payer: Medicare Other | Admitting: Anesthesiology

## 2019-08-26 ENCOUNTER — Encounter (HOSPITAL_COMMUNITY): Payer: Self-pay | Admitting: Orthopedic Surgery

## 2019-08-26 DIAGNOSIS — S81809D Unspecified open wound, unspecified lower leg, subsequent encounter: Secondary | ICD-10-CM

## 2019-08-26 DIAGNOSIS — L98492 Non-pressure chronic ulcer of skin of other sites with fat layer exposed: Secondary | ICD-10-CM

## 2019-08-26 HISTORY — PX: SKIN SPLIT GRAFT: SHX444

## 2019-08-26 LAB — CBC
HCT: 27.6 % — ABNORMAL LOW (ref 36.0–46.0)
Hemoglobin: 8.4 g/dL — ABNORMAL LOW (ref 12.0–15.0)
MCH: 28.4 pg (ref 26.0–34.0)
MCHC: 30.4 g/dL (ref 30.0–36.0)
MCV: 93.2 fL (ref 80.0–100.0)
Platelets: 280 10*3/uL (ref 150–400)
RBC: 2.96 MIL/uL — ABNORMAL LOW (ref 3.87–5.11)
RDW: 15.4 % (ref 11.5–15.5)
WBC: 8.4 10*3/uL (ref 4.0–10.5)
nRBC: 0 % (ref 0.0–0.2)

## 2019-08-26 LAB — BASIC METABOLIC PANEL
Anion gap: 8 (ref 5–15)
BUN: 10 mg/dL (ref 6–20)
CO2: 23 mmol/L (ref 22–32)
Calcium: 8.6 mg/dL — ABNORMAL LOW (ref 8.9–10.3)
Chloride: 106 mmol/L (ref 98–111)
Creatinine, Ser: 0.95 mg/dL (ref 0.44–1.00)
GFR calc Af Amer: >60 mL/min (ref >60)
GFR calc non Af Amer: >60 mL/min (ref >60)
Glucose, Bld: 106 mg/dL — ABNORMAL HIGH (ref 70–99)
Potassium: 4.1 mmol/L (ref 3.5–5.1)
Sodium: 137 mmol/L (ref 135–145)

## 2019-08-26 LAB — VANCOMYCIN, TROUGH: Vancomycin Tr: 15 ug/mL (ref 15–20)

## 2019-08-26 SURGERY — APPLICATION, GRAFT, SKIN, SPLIT-THICKNESS
Anesthesia: General | Laterality: Bilateral

## 2019-08-26 MED ORDER — ACETAMINOPHEN 500 MG PO TABS
1000.0000 mg | ORAL_TABLET | Freq: Once | ORAL | Status: AC
  Administered 2019-08-26: 1000 mg via ORAL

## 2019-08-26 MED ORDER — 0.9 % SODIUM CHLORIDE (POUR BTL) OPTIME
TOPICAL | Status: DC | PRN
  Administered 2019-08-26 (×2): 1000 mL

## 2019-08-26 MED ORDER — DOCUSATE SODIUM 100 MG PO CAPS
100.0000 mg | ORAL_CAPSULE | Freq: Two times a day (BID) | ORAL | Status: DC
  Filled 2019-08-26 (×2): qty 1

## 2019-08-26 MED ORDER — ONDANSETRON HCL 4 MG/2ML IJ SOLN
INTRAMUSCULAR | Status: AC
  Filled 2019-08-26: qty 2

## 2019-08-26 MED ORDER — ENSURE PRE-SURGERY PO LIQD
296.0000 mL | Freq: Once | ORAL | Status: AC
  Administered 2019-08-26: 296 mL via ORAL
  Filled 2019-08-26: qty 296

## 2019-08-26 MED ORDER — STERILE WATER FOR IRRIGATION IR SOLN
Status: DC | PRN
  Administered 2019-08-26: 1000 mL

## 2019-08-26 MED ORDER — ONDANSETRON HCL 4 MG/2ML IJ SOLN: 4.0000 mg | Freq: Four times a day (QID) | INTRAMUSCULAR | Status: DC | PRN

## 2019-08-26 MED ORDER — PHENYLEPHRINE 40 MCG/ML (10ML) SYRINGE FOR IV PUSH (FOR BLOOD PRESSURE SUPPORT)
PREFILLED_SYRINGE | INTRAVENOUS | Status: AC
  Filled 2019-08-26: qty 10

## 2019-08-26 MED ORDER — FENTANYL CITRATE (PF) 250 MCG/5ML IJ SOLN
INTRAMUSCULAR | Status: AC
  Filled 2019-08-26: qty 5

## 2019-08-26 MED ORDER — LIDOCAINE HCL (CARDIAC) PF 100 MG/5ML IV SOSY
PREFILLED_SYRINGE | INTRAVENOUS | Status: DC | PRN
  Administered 2019-08-26: 100 mg via INTRAVENOUS

## 2019-08-26 MED ORDER — ACETAMINOPHEN 500 MG PO TABS
ORAL_TABLET | ORAL | Status: AC
  Filled 2019-08-26: qty 2

## 2019-08-26 MED ORDER — MAGNESIUM CITRATE PO SOLN: 1.0000 | Freq: Once | ORAL | Status: DC | PRN

## 2019-08-26 MED ORDER — ALBUMIN HUMAN 5 % IV SOLN
INTRAVENOUS | Status: DC | PRN
  Administered 2019-08-26: 14:00:00 via INTRAVENOUS

## 2019-08-26 MED ORDER — LACTATED RINGERS IV SOLN
INTRAVENOUS | Status: DC
  Administered 2019-08-26: 13:00:00 via INTRAVENOUS

## 2019-08-26 MED ORDER — PROPOFOL 10 MG/ML IV BOLUS
INTRAVENOUS | Status: DC | PRN
  Administered 2019-08-26: 200 mg via INTRAVENOUS

## 2019-08-26 MED ORDER — ONDANSETRON HCL 4 MG/2ML IJ SOLN
INTRAMUSCULAR | Status: DC | PRN
  Administered 2019-08-26: 4 mg via INTRAVENOUS

## 2019-08-26 MED ORDER — ONDANSETRON HCL 4 MG PO TABS: 4.0000 mg | ORAL_TABLET | Freq: Four times a day (QID) | ORAL | Status: DC | PRN

## 2019-08-26 MED ORDER — PHENYLEPHRINE 40 MCG/ML (10ML) SYRINGE FOR IV PUSH (FOR BLOOD PRESSURE SUPPORT)
PREFILLED_SYRINGE | INTRAVENOUS | Status: DC | PRN
  Administered 2019-08-26 (×3): 80 ug via INTRAVENOUS
  Administered 2019-08-26: 120 ug via INTRAVENOUS

## 2019-08-26 MED ORDER — FENTANYL CITRATE (PF) 100 MCG/2ML IJ SOLN
INTRAMUSCULAR | Status: DC | PRN
  Administered 2019-08-26 (×2): 50 ug via INTRAVENOUS

## 2019-08-26 MED ORDER — PROPOFOL 10 MG/ML IV BOLUS
INTRAVENOUS | Status: AC
  Filled 2019-08-26: qty 20

## 2019-08-26 MED ORDER — VANCOMYCIN HCL IN DEXTROSE 1-5 GM/200ML-% IV SOLN
1000.0000 mg | INTRAVENOUS | Status: DC
  Filled 2019-08-26: qty 200

## 2019-08-26 MED ORDER — DEXAMETHASONE SODIUM PHOSPHATE 10 MG/ML IJ SOLN
INTRAMUSCULAR | Status: DC | PRN
  Administered 2019-08-26: 4 mg via INTRAVENOUS

## 2019-08-26 MED ORDER — METHOCARBAMOL 500 MG PO TABS
500.0000 mg | ORAL_TABLET | Freq: Four times a day (QID) | ORAL | Status: DC | PRN
  Administered 2019-08-26: 500 mg via ORAL
  Filled 2019-08-26: qty 1

## 2019-08-26 MED ORDER — METHOCARBAMOL 1000 MG/10ML IJ SOLN
500.0000 mg | Freq: Four times a day (QID) | INTRAVENOUS | Status: DC | PRN
  Filled 2019-08-26: qty 5

## 2019-08-26 MED ORDER — EPHEDRINE SULFATE-NACL 50-0.9 MG/10ML-% IV SOSY
PREFILLED_SYRINGE | INTRAVENOUS | Status: DC | PRN
  Administered 2019-08-26 (×2): 10 mg via INTRAVENOUS
  Administered 2019-08-26: 15 mg via INTRAVENOUS

## 2019-08-26 MED ORDER — FENTANYL CITRATE (PF) 100 MCG/2ML IJ SOLN
25.0000 ug | INTRAMUSCULAR | Status: DC | PRN
  Administered 2019-08-26: 50 ug via INTRAVENOUS
  Administered 2019-08-26 (×2): 25 ug via INTRAVENOUS

## 2019-08-26 MED ORDER — METOCLOPRAMIDE HCL 5 MG PO TABS: 5.0000 mg | ORAL_TABLET | Freq: Three times a day (TID) | ORAL | Status: DC | PRN

## 2019-08-26 MED ORDER — MENTHOL 3 MG MT LOZG
1.0000 | LOZENGE | OROMUCOSAL | Status: DC | PRN
  Filled 2019-08-26: qty 9

## 2019-08-26 MED ORDER — BISACODYL 10 MG RE SUPP: 10.0000 mg | Freq: Every day | RECTAL | Status: DC | PRN

## 2019-08-26 MED ORDER — ONDANSETRON HCL 4 MG/2ML IJ SOLN: 4.0000 mg | Freq: Once | INTRAMUSCULAR | Status: DC | PRN

## 2019-08-26 MED ORDER — EPHEDRINE 5 MG/ML INJ
INTRAVENOUS | Status: AC
  Filled 2019-08-26: qty 10

## 2019-08-26 MED ORDER — METOCLOPRAMIDE HCL 5 MG/ML IJ SOLN: 5.0000 mg | Freq: Three times a day (TID) | INTRAMUSCULAR | Status: DC | PRN

## 2019-08-26 MED ORDER — CEFAZOLIN SODIUM-DEXTROSE 2-4 GM/100ML-% IV SOLN
2.0000 g | Freq: Four times a day (QID) | INTRAVENOUS | Status: AC
  Administered 2019-08-26 – 2019-08-27 (×3): 2 g via INTRAVENOUS
  Filled 2019-08-26 (×3): qty 100

## 2019-08-26 MED ORDER — FENTANYL CITRATE (PF) 100 MCG/2ML IJ SOLN
INTRAMUSCULAR | Status: AC
  Filled 2019-08-26: qty 2

## 2019-08-26 MED ORDER — DEXAMETHASONE SODIUM PHOSPHATE 10 MG/ML IJ SOLN
INTRAMUSCULAR | Status: AC
  Filled 2019-08-26: qty 1

## 2019-08-26 MED ORDER — SODIUM CHLORIDE 0.9 % IV SOLN: INTRAVENOUS | Status: DC

## 2019-08-26 MED ORDER — LIDOCAINE 2% (20 MG/ML) 5 ML SYRINGE
INTRAMUSCULAR | Status: AC
  Filled 2019-08-26: qty 5

## 2019-08-26 MED ORDER — POLYETHYLENE GLYCOL 3350 17 G PO PACK: 17.0000 g | PACK | Freq: Every day | ORAL | Status: DC | PRN

## 2019-08-26 SURGICAL SUPPLY — 51 items
ALLOGRAFT SKIN MESHD 387 SQ CM (Graft) ×1 IMPLANT
BNDG COHESIVE 6X5 TAN STRL LF (GAUZE/BANDAGES/DRESSINGS) ×4 IMPLANT
BNDG ESMARK 4X9 LF (GAUZE/BANDAGES/DRESSINGS) ×3 IMPLANT
BNDG GAUZE ELAST 4 BULKY (GAUZE/BANDAGES/DRESSINGS) IMPLANT
CANISTER WOUNDNEG PRESSURE 500 (CANNISTER) ×2 IMPLANT
COVER SURGICAL LIGHT HANDLE (MISCELLANEOUS) ×6 IMPLANT
COVER WAND RF STERILE (DRAPES) ×3 IMPLANT
CUFF TOURN SGL QUICK 18X4 (TOURNIQUET CUFF) IMPLANT
CUFF TOURN SGL QUICK 24 (TOURNIQUET CUFF)
CUFF TRNQT CYL 24X4X16.5-23 (TOURNIQUET CUFF) IMPLANT
DERMACARRIERS GRAFT 1 TO 1.5 (DISPOSABLE)
DRAPE INCISE IOBAN 66X45 STRL (DRAPES) ×2 IMPLANT
DRAPE U-SHAPE 47X51 STRL (DRAPES) ×3 IMPLANT
DRESSING VERAFLO CLEANSE CC (GAUZE/BANDAGES/DRESSINGS) IMPLANT
DRSG ADAPTIC 3X8 NADH LF (GAUZE/BANDAGES/DRESSINGS) IMPLANT
DRSG MEPITEL 4X7.2 (GAUZE/BANDAGES/DRESSINGS) ×3 IMPLANT
DRSG VERAFLO CLEANSE CC (GAUZE/BANDAGES/DRESSINGS) ×6
DURAPREP 26ML APPLICATOR (WOUND CARE) ×3 IMPLANT
ELECT REM PT RETURN 9FT ADLT (ELECTROSURGICAL) ×3
ELECTRODE REM PT RTRN 9FT ADLT (ELECTROSURGICAL) ×1 IMPLANT
GAUZE SPONGE 4X4 12PLY STRL (GAUZE/BANDAGES/DRESSINGS) IMPLANT
GLOVE BIOGEL PI IND STRL 9 (GLOVE) ×1 IMPLANT
GLOVE BIOGEL PI INDICATOR 9 (GLOVE) ×2
GLOVE SURG ORTHO 9.0 STRL STRW (GLOVE) ×3 IMPLANT
GOWN STRL REUS W/ TWL XL LVL3 (GOWN DISPOSABLE) ×2 IMPLANT
GOWN STRL REUS W/TWL XL LVL3 (GOWN DISPOSABLE) ×4
GRAFT DERMACARRIERS 1 TO 1.5 (DISPOSABLE) IMPLANT
GRAFT TISS MESH 387 BURN (Graft) IMPLANT
KIT BASIN OR (CUSTOM PROCEDURE TRAY) ×3 IMPLANT
KIT TURNOVER KIT B (KITS) ×3 IMPLANT
MANIFOLD NEPTUNE II (INSTRUMENTS) ×3 IMPLANT
MICROMATRIX 1000MG (Tissue) ×3 IMPLANT
NDL HYPO 25GX1X1/2 BEV (NEEDLE) IMPLANT
NEEDLE HYPO 25GX1X1/2 BEV (NEEDLE) IMPLANT
NS IRRIG 1000ML POUR BTL (IV SOLUTION) ×3 IMPLANT
PACK ORTHO EXTREMITY (CUSTOM PROCEDURE TRAY) ×3 IMPLANT
PAD ARMBOARD 7.5X6 YLW CONV (MISCELLANEOUS) ×6 IMPLANT
PAD CAST 4YDX4 CTTN HI CHSV (CAST SUPPLIES) IMPLANT
PAD NEG PRESSURE SENSATRAC (MISCELLANEOUS) ×4 IMPLANT
PADDING CAST COTTON 4X4 STRL (CAST SUPPLIES)
SKIN MESHED 387 SQ CM (Graft) ×3 IMPLANT
SOLUTION PARTIC MCRMTRX 1000MG (Tissue) IMPLANT
SUCTION FRAZIER HANDLE 10FR (MISCELLANEOUS)
SUCTION TUBE FRAZIER 10FR DISP (MISCELLANEOUS) IMPLANT
SUT ETHILON 4 0 PS 2 18 (SUTURE) IMPLANT
SYR CONTROL 10ML LL (SYRINGE) IMPLANT
TOWEL GREEN STERILE (TOWEL DISPOSABLE) ×3 IMPLANT
TOWEL GREEN STERILE FF (TOWEL DISPOSABLE) ×3 IMPLANT
TUBE CONNECTING 12'X1/4 (SUCTIONS)
TUBE CONNECTING 12X1/4 (SUCTIONS) IMPLANT
WATER STERILE IRR 1000ML POUR (IV SOLUTION) ×3 IMPLANT

## 2019-08-26 NOTE — Transfer of Care (Signed)
Immediate Anesthesia Transfer of Care Note  Patient: Kari [name]  Procedure(s) Performed: SKIN GRAFT SPLIT THICKNESS BILATERAL LEGS, APPLY WOUND VAC (Bilateral )  Patient Location: PACU  Anesthesia Type:General  Level of Consciousness: awake and alert   Airway & Oxygen Therapy: Patient Spontanous Breathing and Patient connected to face mask oxygen  Post-op Assessment: Report given to RN and Post -op Vital signs reviewed and stable  Post vital signs: Reviewed and stable  Last Vitals:  Vitals Value Taken Time  BP 125/70 08/26/19 1504  Temp    Pulse 83 08/26/19 1506  Resp 12 08/26/19 1506  SpO2 98 % 08/26/19 1506  Vitals shown include unvalidated device data.  Last Pain:  Vitals:   08/26/19 0517  TempSrc:   PainSc: Asleep      Patients Stated Pain Goal: 2 (03/79/44 4619)  Complications: No apparent anesthesia complications

## 2019-08-26 NOTE — TOC Progression Note (Signed)
Transition of Care [facility]) - Progression Note    Patient Details  Name: Kari [name] MRN: [id number] Date of Birth: August 06, 1961  Transition of Care [facility]) CM/SW [place], [place] Phone Number: 08/26/2019, 4:33 PM  Clinical Narrative:    Acknowledging consult for SNF placement. Both CIR and TOC teams following for pt progress following debridement today.    Expected Discharge Plan: [place] Barriers to Discharge: Continued Medical Work up  Expected Discharge Plan and Services Expected Discharge Plan: [place]   Discharge Planning Services: CM Consult Post Acute Care Choice: [place] arrangements for the past 2 months: Apartment Expected Discharge Date: (unknown)               DME Arranged: N/A           HH Agency: [place] (now [facility]) Date [place]: 08/20/19 Time [place]: 1021 Representative spoke with at [place]: [place] ([place]) Interventions    Readmission Risk Interventions No flowsheet data found.

## 2019-08-26 NOTE — Progress Notes (Signed)
Patient returned to unit from OR for bilateral lower extremity skin graft. Wound vac in place to bilateral lower extremity. Patient alert and settled in bed. Patient request pain medication, see MAR. Will continue to monitor closely for remainder of shift.

## 2019-08-26 NOTE — Anesthesia Preprocedure Evaluation (Addendum)
Anesthesia Evaluation  Patient identified by MRN, date of birth, ID band Patient awake  General Assessment Comment:Sarcoidosis  Reviewed: Allergy & Precautions, NPO status , Patient's Chart, lab work & pertinent test results, reviewed documented beta blocker date and time   History of Anesthesia Complications Negative for: history of anesthetic complications  Airway Mallampati: II  TM Distance: >3 FB Neck ROM: Full    Dental  (+) Teeth Intact, Dental Advisory Given   Pulmonary asthma ,   Sarcoidosis    Pulmonary exam normal breath sounds clear to auscultation       Cardiovascular hypertension, Pt. on home beta blockers + Past MI and +CHF  + Cardiac Defibrillator + Valvular Problems/Murmurs MR  Rhythm:Regular Rate:Normal + Systolic murmurs  '18 Cath - Left dominant coronary anatomy. Normal coronary arteries. Mild to moderate global left ventricular hypo-kinesis with estimated EF 35-40%. LV end-diastolic pressure is up and normal. Combined systolic and diastolic heart failure, chronic  '18 TTE - EF 40-45%. Diffuse hypokinesis worse in the inferior basal wall. LV cavity size was moderately dilated. Moderate LVH. Mild AI. Moderate MR. Moderately dilated LA.     Neuro/Psych  Headaches, PSYCHIATRIC DISORDERS Depression    GI/Hepatic negative GI ROS, Neg liver ROS,   Endo/Other  negative endocrine ROSMorbid obesity  Renal/GU CRFRenal disease     Musculoskeletal  (+) Arthritis , WOUNDS BILATERAL LEGS   Abdominal   Peds  Hematology negative hematology ROS (+) Blood dyscrasia, anemia ,   Anesthesia Other Findings Day of surgery medications reviewed with the patient.  Reproductive/Obstetrics                             Anesthesia Physical  Anesthesia Plan  ASA: IV  Anesthesia Plan: General   Post-op Pain Management:    Induction: Intravenous  PONV Risk Score and Plan: 3 and  Treatment may vary due to age or medical condition, Ondansetron, Dexamethasone and Midazolam  Airway Management Planned: LMA  Additional Equipment: None  Intra-op Plan:   Post-operative Plan: Extubation in OR  Informed Consent: I have reviewed the patients History and Physical, chart, labs and discussed the procedure including the risks, benefits and alternatives for the proposed anesthesia with the patient or authorized representative who has indicated his/her understanding and acceptance.     Dental advisory given  Plan Discussed with: CRNA  Anesthesia Plan Comments:         Anesthesia Quick Evaluation

## 2019-08-26 NOTE — Op Note (Signed)
08/26/2019  3:07 PM  PATIENT:  [name] DIAGNOSIS: Chronic venous stasis WOUNDS BILATERAL LEGS  POST-OPERATIVE DIAGNOSIS:  Same  PROCEDURE: Wound bed preparation both legs 200 cm each. SKIN GRAFT SPLIT THICKNESS BILATERAL LEGS, allograft split-thickness skin graft total 387 cm.  APPLY WOUND VAC, cleanse choice dressings 2 sponges each leg.  SURGEON:  [name], MD  PHYSICIAN ASSISTANT:None ANESTHESIA:   General  PREOPERATIVE INDICATIONS:  Kari [name] is a  58 y.o. female with a diagnosis of [place] who failed conservative measures and elected for surgical management.    The risks benefits and alternatives were discussed with the patient preoperatively including but not limited to the risks of infection, bleeding, nerve injury, cardiopulmonary complications, the need for revision surgery, among others, and the patient was willing to proceed.  OPERATIVE IMPLANTS: 387 cm split-thickness skin graft allograft with cleanse choice wound vacs x2.  @ENCIMAGES @  OPERATIVE FINDINGS: Good petechial bleeding no signs of infection.  OPERATIVE PROCEDURE: Patient was brought to the operating room and underwent a general anesthetic.  After adequate levels anesthesia were obtained both lower extremities were prepped using Betadine paint draped into a sterile field a timeout was called.  A rondure Cobb and curette were used to debride skin and soft tissue muscle and fascia from both legs back to bleeding petechial tissue.  Both legs were irrigated with normal saline.  The split-thickness skin graft was then applied to each leg a total of 200 cm for each leg.  These were both covered with the cleanse choice dressings this had a good suction fit everything was overwrapped with Coban dressing   DISCHARGE PLANNING:  Antibiotic duration: Continue antibiotics for 24 hours  Weightbearing: Weightbearing as tolerated both lower extremities  Pain  medication: Opioid pathway  Dressing care/ Wound VAC: Continue wound vacs for 1 week  Ambulatory devices: Walker  Discharge to: Possible skilled nursing  Follow-up: In the office 1 week post operative.

## 2019-08-26 NOTE — Interval H&P Note (Signed)
History and Physical Interval Note:  08/26/2019 6:42 AM  Kari [name] [name]  has presented today for surgery, with the diagnosis of WOUNDS BILATERAL LEGS.  The various methods of treatment have been discussed with the patient and family. After consideration of risks, benefits and other options for treatment, the patient has consented to  Procedure(s): SKIN GRAFT SPLIT THICKNESS BILATERAL LEGS, APPLY WOUND VAC (Bilateral) as a surgical intervention.  The patient's history has been reviewed, patient examined, no change in status, stable for surgery.  I have reviewed the patient's chart and labs.  Questions were answered to the patient's satisfaction.     [name]

## 2019-08-26 NOTE — Anesthesia Procedure Notes (Signed)
Procedure Name: LMA Insertion Date/Time: 08/26/2019 2:16 PM Performed by: Eulas Post, Rodert Hinch W, CRNA Pre-anesthesia Checklist: Patient identified, Emergency Drugs available, Suction available and Patient being monitored Patient Re-evaluated:Patient Re-evaluated prior to induction Oxygen Delivery Method: Circle system utilized Preoxygenation: Pre-oxygenation with 100% oxygen Induction Type: IV induction Ventilation: Mask ventilation without difficulty LMA: LMA inserted LMA Size: 4.0 Number of attempts: 1 Placement Confirmation: positive ETCO2 and breath sounds checked- equal and bilateral Tube secured with: Tape Dental Injury: Teeth and Oropharynx as per pre-operative assessment

## 2019-08-26 NOTE — Progress Notes (Signed)
Patient transported of unit to OR for procedure.

## 2019-08-26 NOTE — Progress Notes (Signed)
PROGRESS NOTE    [name]  BTD:[id number] DOB: Jun 13, 1961 DOA: 08/19/2019 PCP: Rutherford Guys, MD   Brief Narrative:  Kari [name] is a 58 y.o. 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 Sharol Given and she was discharged to inpatient rehab presents today from home for worsening lower extremity wounds with increased drainage and foul smell and worsening pain.  Patient denies any fevers or chills, denies chest pain shortness of breath or cough, denies any nausea vomiting abdominal pain, dysuria, hematochezia or hematemesis. She is able to walk with assistance and pain is a limiting factor for ambulation. On arrival to ED she was found to be afebrile normotensive.  Labs revealed AKI with creatinine of 3.04, sodium of 131, glucose of 120, BUN of 54, lactic acid was 1.7, WBC count 16.1, hemoglobin of 10.9.  Blood cultures were drawn and are pending. EKG shows sinus rhythm with prolonged PR interval. Orthopedics consulted , Dr. Sharol Given requested to transfer the patient to [facility] for possible I&D on Friday. She was referred to medical service/TR[facility] for evaluation and management of chronic nonhealing lower extremity venous ulcers.  Assessment & Plan:   Active Problems:   Asthma   Venous (peripheral) insufficiency   Morbid obesity (HCC)   Anemia of chronic disease   Acute kidney injury ([place])   CKD (chronic kidney disease), stage III (HCC)   H/O skin graft   Non-healing ulcer ([place])   Multiple open wounds of lower leg   Moderate protein-calorie malnutrition (HCC)    Nonhealing bilateral lower extremity venous stasis ulcers, POA Continue vancomycin, Zosyn -de-escalate based on cultures Orthopedics, Dr Sharol Given following, debridement  with wound VAC placement 08/23/2019 Tentative skin graft split thickness with bilateral legs and wound VAC placement 08/26/2019 Pain management ongoing - pain currently well controlled Patient remains without leukocytosis, afebrile -clinically appears quite well  History of nonischemic cardiomyopathy status post ICD Continue to hold patient's home furosemide given AKI as below  Hypertension Well-controlled continue with Coreg at the same dose.  Acute kidney injury on stage III CKD, resolved Probably secondary to ATN versus poor oral intake/dehydration Continue IV fluids while poor p.o. intake perioperatively Creatinine 1.3 (admitted at 3.04), baseline appears to be around 1.2/1.3  Chronic anemia of chronic disease, baseline hemoglobin appears to be around 10 and stable. Near baseline -somewhat downtrending in the setting of daily phlebotomy, suspect postop anemia as well Hemoglobin continues to trend down in the setting of daily phlebotomy currently 8.4 Consider transfusion if less than 7 or symptomatic  Asthma, not in acute exacerbation Follow clinically  DVT prophylaxis: Lovenox Code Status: Full Disposition Plan: Pending further surgical evaluation and procedure, PT, need for ongoing IV antibiotics.  Patient to be reevaluated in the OR on 08/26/2019, suspect an additional 24 to 48 hours postoperative care minimally as well as ongoing PT and OT given bilateral foot infections and the wounds.  Likely to need SNF at discharge given suspected poor ambulatory status pending PT recommendations.  Consultants:   Orthopedic surgery  Procedures:   Bilateral foot debridement with wound VAC placement 08/23/2019  Tentative repeat procedure 08/26/2019 with further bilateral foot debridement and split thickness skin graft and wound VAC placement   Subjective: No acute issues or events overnight, pain currently well controlled, denies  chest pain, shortness of breath, nausea, vomiting, diarrhea,  constipation, headache, fevers, chills.  Objective: Vitals:   08/25/19 0517 08/25/19 1615 08/25/19 2148 08/26/19 0426  BP:  (!) 112/42 (!) 91/55 108/68  Pulse: 60 71 67 65  Resp:  16 18 16   Temp:  98.2 F (36.8 C) 98.1 F (36.7 C) 98.3 F (36.8 C)  TempSrc:  Oral Oral Oral  SpO2: 97% 98% 96% 96%  Weight:      Height:        Intake/Output Summary (Last 24 hours) at 08/26/2019 0809 Last data filed at 08/26/2019 0540 Gross per 24 hour  Intake 820 ml  Output 1250 ml  Net -430 ml   Filed Weights   08/19/19 1110 08/19/19 1823 08/20/19 0600  Weight: 135.6 kg 125.1 kg 125.8 kg    Examination:  General exam: Appears calm and comfortable  Respiratory system: Clear to auscultation. Respiratory effort normal. Cardiovascular system: S1 & S2 heard, RRR. No JVD, murmurs, rubs, gallops or clicks. No pedal edema. Gastrointestinal system: Abdomen is nondistended, soft and nontender. No organomegaly or masses felt. Normal bowel sounds heard. Central nervous system: Alert and oriented. No focal neurological deficits. Extremities: Bilateral foot bandages clean dry intact, wound VAC without overt leak. Psychiatry: Judgement and insight appear normal. Mood & affect appropriate.   Data Reviewed: I have personally reviewed following labs and imaging studies  CBC: Recent Labs  Lab 08/19/19 1112  08/22/19 0212 08/23/19 0232 08/24/19 0235 08/25/19 0159 08/26/19 0505  WBC 16.1*   < > 7.9 7.7 8.0 8.0 8.4  NEUTROABS 13.2*  --   --   --   --   --   --   HGB 10.9*   < > 8.6* 8.5* 7.9* 7.9* 8.4*  HCT 35.7*   < > 28.8* 28.2* 26.8* 26.5* 27.6*  MCV 92.5   < > 93.2 92.8 94.7 93.6 93.2  PLT 375   < > 282 304 297 270 280   < > = values in this interval not displayed.   Basic Metabolic Panel: Recent Labs  Lab 08/22/19 0212 08/23/19 0232 08/24/19 0235 08/25/19 0159 08/26/19 0505  NA 140 139 138 138 137  K 3.3* 3.2* 3.9 3.3* 4.1  CL 110 108 109 108 106  CO2 23 22 23 24 23   GLUCOSE 102* 106*  156* 114* 106*  BUN 15 9 10 11 10   CREATININE 1.30* 1.04* 1.36* 1.32* 0.95  CALCIUM 8.3* 8.4* 8.5* 8.5* 8.6*   Liver Function Tests: Recent Labs  Lab 08/19/19 1112  AST 12*  ALT 13  ALKPHOS 88  BILITOT 1.2  PROT 10.3*  ALBUMIN 2.9*   Sepsis Labs: Recent Labs  Lab 08/19/19 1115  LATICACIDVEN 1.7    Recent Results (from the past 240 hour(s))  Culture, blood (routine x 2)     Status: None   Collection Time: 08/19/19 11:27 AM   Specimen: BLOOD  Result Value Ref Range Status   Specimen Description   Final    BLOOD RIGHT ANTECUBITAL Performed at [facility], [place] [address], [place], [place] [address]    Special Requests   Final    BOTTLES DRAWN AEROBIC AND ANAEROBIC Blood Culture adequate volume Performed at [place] [address] [place], [place] [address]    Culture   Final    NO GROWTH 5 DAYS Performed at [facility], [place] [address], [place], [place] [address]    Report Status 08/24/2019 FINAL  Final  SARS CORONAVIRUS 2 (TAT 6-12 HRS) Nasal Swab Aptima Multi Swab     Status: None   Collection Time: 08/19/19 11:28 AM   Specimen: Aptima Multi Swab; Nasal Swab  Result Value Ref Range Status   SARS Coronavirus 2 NEGATIVE NEGATIVE Final    Comment: (NOTE) SARS-CoV-2 target nucleic acids are NOT DETECTED. The SARS-CoV-2 RNA is generally detectable in upper and lower respiratory specimens during the acute phase of infection. Negative results do not preclude SARS-CoV-2 infection, do not rule out co-infections with other pathogens, and should not be used as the sole basis for treatment or other patient management decisions. Negative results must be combined with clinical observations, patient history, and epidemiological information. The expected result is Negative. Fact Sheet for Patients: SugarRoll.be Fact Sheet for Healthcare Providers: https://www.woods-mathews.com/ This test is not yet  approved or cleared by the [place] FDA and  has been authorized for detection and/or diagnosis of SARS-CoV-2 by FDA under an Emergency Use Authorization (EUA). This EUA will remain  in effect (meaning this test can be used) for the duration of the COVID-19 declaration under Section 56 4(b)(1) of the Act, 21 U.S.C. section 360bbb-3(b)(1), unless the authorization is terminated or revoked sooner. Performed at [facility], [place] [address], [place], [place] [address]   Culture, blood (routine x 2)     Status: None   Collection Time: 08/19/19 11:32 AM   Specimen: BLOOD  Result Value Ref Range Status   Specimen Description   Final    BLOOD LEFT ANTECUBITAL Performed at [facility], [place] [address], [place], [place] [address]    Special Requests   Final    BOTTLES DRAWN AEROBIC AND ANAEROBIC Blood Culture adequate volume Performed at [place] [address] [place], [place] [address]    Culture   Final    NO GROWTH 5 DAYS Performed at [facility], [place] [address] [place], [place] [address]    Report Status 08/24/2019 FINAL  Final  Surgical pcr screen     Status: None   Collection Time: 08/22/19  2:59 PM   Specimen: Nasal Mucosa; Nasal Swab  Result Value Ref Range Status   MRSA, PCR NEGATIVE NEGATIVE Final   Staphylococcus aureus NEGATIVE NEGATIVE Final    Comment: (NOTE) The Xpert SA Assay (FDA approved for NASAL specimens in patients 51 years of age and older), is one component of a comprehensive surveillance program. It is not intended to diagnose infection nor to guide or monitor treatment. Performed at [facility], [place] [address] [place], [place] [address]   Aerobic/Anaerobic Culture (surgical/deep wound)     Status: None (Preliminary result)   Collection Time: 08/23/19  8:28 AM   Specimen: Soft Tissue, Other  Result Value Ref Range Status   Specimen Description TISSUE RIGHT LEG  Final   Special Requests A  Final    Gram Stain   Final    MODERATE WBC PRESENT, PREDOMINANTLY PMN RARE GRAM POSITIVE COCCI IN PAIRS RARE GRAM NEGATIVE RODS    Culture   Final    FEW PSEUDOMONAS AERUGINOSA HOLDING FOR POSSIBLE ANAEROBE Performed at [facility], [address] [place], [place] [address]    Report Status PENDING  Incomplete   Organism ID, Bacteria PSEUDOMONAS AERUGINOSA  Final      Susceptibility   Pseudomonas aeruginosa - MIC*    CEFTAZIDIME 2 SENSITIVE Sensitive     CIPROFLOXACIN 2 INTERMEDIATE Intermediate  GENTAMICIN 2 SENSITIVE Sensitive     IMIPENEM 1 SENSITIVE Sensitive     CEFEPIME 4 SENSITIVE Sensitive     * FEW PSEUDOMONAS AERUGINOSA  Aerobic/Anaerobic Culture (surgical/deep wound)     Status: None (Preliminary result)   Collection Time: 08/23/19  8:30 AM   Specimen: Soft Tissue, Other  Result Value Ref Range Status   Specimen Description TISSUE LEFT LEG  Final   Special Requests B  Final   Gram Stain   Final    MODERATE WBC PRESENT, PREDOMINANTLY PMN FEW GRAM POSITIVE COCCI IN PAIRS IN CLUSTERS RARE GRAM NEGATIVE RODS    Culture   Final    FEW PSEUDOMONAS AERUGINOSA CULTURE REINCUBATED FOR BETTER GROWTH    Report Status PENDING  Incomplete   Organism ID, Bacteria PSEUDOMONAS AERUGINOSA  Final      Susceptibility   Pseudomonas aeruginosa - MIC*    CEFTAZIDIME 2 SENSITIVE Sensitive     CIPROFLOXACIN 1 SENSITIVE Sensitive     GENTAMICIN <=1 SENSITIVE Sensitive     IMIPENEM 2 SENSITIVE Sensitive     PIP/TAZO 8 SENSITIVE Sensitive     CEFEPIME Value in next row Sensitive      <=1 SENSITIVEPerformed at [facility], [address], [place], [place] [address]    * FEW PSEUDOMONAS AERUGINOSA    Radiology Studies: No results found.  Scheduled Meds: . allopurinol  200 mg Oral Daily  . carvedilol  6.25 mg Oral BID  . docusate sodium  100 mg Oral BID   Continuous Infusions: . sodium chloride 100 mL/hr at 08/22/19 1405  . sodium chloride    . sodium chloride 10  mL/hr at 08/23/19 1815  .  ceFAZolin (ANCEF) IV    . methocarbamol (ROBAXIN) IV    . piperacillin-tazobactam (ZOSYN)  IV 3.375 g (08/26/19 0430)  . vancomycin 1,250 mg (08/25/19 0943)     LOS: 7 days   Time spent: 51min  Donzella Carrol C Kazoua Gossen, DO [facility]  If 7PM-7AM, please contact night-coverage [email or web address] Password [email or web address] 08/26/2019, 8:09 AM

## 2019-08-26 NOTE — Progress Notes (Signed)
Pharmacy Antibiotic Note  Kari [name] is a 59 y.o. female admitted on 08/19/2019 with chronic LE ulcers.  Pharmacy has been consulted for Vancomcyin dosing.  ID: Wound infection, chronic LE ulcers with recent I&D with skin grafts on 7/24. massive venous ulcers with necrotic tissue both legs WBC WNL, afebrile, Scr down to 0.95 today  Antimicrobials this admission:  Vancomycin 8/26 >>  Zosyn 8/26 >>  Dose adjustments this admission:  8/27 VR = 18 --> 1250 mg x 1, then resume q24h with improvement in renal fxn  9/2 VP 41, VT 15, AUC 644.6 -> 1000 mg q24h AUC 515.6  Microbiology results:  8/26 BCx: negative 8/26 COVID: Negative 8/29 Surgical PCR: Negative 8/30 L leg tissue: GPC, GNR+ few PSA, pansensitive 8/30: R leg tissue GPC, GNR+ few PSA, sensitive to ceftazidime, gent, imipenem, cefepime   9/1: Vanco Peak 32, Vanco Trough 18 (after dose already charted)= AUC 655 (goal 400-550)  Plan: Zosyn 3.375g IV q 8hrs. Vancomycin 1000mg  q24h (est AUC 515.6).  4 wks abx   Height: 5\' 7"  (170.2 cm) Weight: 277 lb 5.4 oz (125.8 kg) IBW/kg (Calculated) : 61.6  Temp (24hrs), Avg:98.2 F (36.8 C), Min:98.1 F (36.7 C), Max:98.3 F (36.8 C)  Recent Labs  Lab 08/20/19 1208  08/22/19 0212 08/23/19 0232 08/24/19 0235 08/24/19 1722 08/25/19 0159 08/25/19 1014 08/25/19 1217 08/26/19 0505 08/26/19 0952  WBC  --    < > 7.9 7.7 8.0  --  8.0  --   --  8.4  --   CREATININE  --    < > 1.30* 1.04* 1.36*  --  1.32*  --   --  0.95  --   VANCOTROUGH  --   --   --   --   --   --   --  18  --   --  15  VANCOPEAK  --   --   --   --   --  32  --   --  41*  --   --   VANCORANDOM 18  --   --   --   --   --   --   --   --   --   --    < > = values in this interval not displayed.    Estimated Creatinine Clearance: 90 mL/min (by C-G formula based on SCr of 0.95 mg/dL).    Allergies  Allergen Reactions  . Tramadol Other (See Comments)    hallucination    Gwenlyn Fudge - Student  PharmD 08/26/2019 11:38 AM

## 2019-08-27 ENCOUNTER — Encounter (HOSPITAL_COMMUNITY): Payer: Self-pay | Admitting: Orthopedic Surgery

## 2019-08-27 ENCOUNTER — Other Ambulatory Visit: Payer: Self-pay

## 2019-08-27 ENCOUNTER — Inpatient Hospital Stay (HOSPITAL_COMMUNITY)
Admission: RE | Admit: 2019-08-27 | Discharge: 2019-09-03 | DRG: 945 | Disposition: A | Discharge status: Home Health | Payer: Medicare Other | Source: Intra-hospital | Attending: Physical Medicine & Rehabilitation | Admitting: Physical Medicine & Rehabilitation

## 2019-08-27 DIAGNOSIS — I252 Old myocardial infarction: Secondary | ICD-10-CM

## 2019-08-27 DIAGNOSIS — X58XXXD Exposure to other specified factors, subsequent encounter: Secondary | ICD-10-CM | POA: Diagnosis present

## 2019-08-27 DIAGNOSIS — N179 Acute kidney failure, unspecified: Secondary | ICD-10-CM

## 2019-08-27 DIAGNOSIS — Z823 Family history of stroke: Secondary | ICD-10-CM | POA: Diagnosis not present

## 2019-08-27 DIAGNOSIS — R7303 Prediabetes: Secondary | ICD-10-CM | POA: Diagnosis present

## 2019-08-27 DIAGNOSIS — E876 Hypokalemia: Secondary | ICD-10-CM | POA: Diagnosis present

## 2019-08-27 DIAGNOSIS — Z8249 Family history of ischemic heart disease and other diseases of the circulatory system: Secondary | ICD-10-CM | POA: Diagnosis not present

## 2019-08-27 DIAGNOSIS — R5381 Other malaise: Secondary | ICD-10-CM | POA: Diagnosis present

## 2019-08-27 DIAGNOSIS — D631 Anemia in chronic kidney disease: Secondary | ICD-10-CM | POA: Diagnosis present

## 2019-08-27 DIAGNOSIS — I5032 Chronic diastolic (congestive) heart failure: Secondary | ICD-10-CM | POA: Diagnosis present

## 2019-08-27 DIAGNOSIS — E785 Hyperlipidemia, unspecified: Secondary | ICD-10-CM | POA: Diagnosis present

## 2019-08-27 DIAGNOSIS — Z8619 Personal history of other infectious and parasitic diseases: Secondary | ICD-10-CM

## 2019-08-27 DIAGNOSIS — L089 Local infection of the skin and subcutaneous tissue, unspecified: Secondary | ICD-10-CM | POA: Diagnosis present

## 2019-08-27 DIAGNOSIS — Z7982 Long term (current) use of aspirin: Secondary | ICD-10-CM | POA: Diagnosis not present

## 2019-08-27 DIAGNOSIS — M1990 Unspecified osteoarthritis, unspecified site: Secondary | ICD-10-CM | POA: Diagnosis present

## 2019-08-27 DIAGNOSIS — D62 Acute posthemorrhagic anemia: Secondary | ICD-10-CM | POA: Diagnosis present

## 2019-08-27 DIAGNOSIS — B964 Proteus (mirabilis) (morganii) as the cause of diseases classified elsewhere: Secondary | ICD-10-CM | POA: Diagnosis present

## 2019-08-27 DIAGNOSIS — I872 Venous insufficiency (chronic) (peripheral): Secondary | ICD-10-CM | POA: Diagnosis present

## 2019-08-27 DIAGNOSIS — Z6841 Body Mass Index (BMI) 40.0 and over, adult: Secondary | ICD-10-CM | POA: Diagnosis not present

## 2019-08-27 DIAGNOSIS — Z79891 Long term (current) use of opiate analgesic: Secondary | ICD-10-CM

## 2019-08-27 DIAGNOSIS — Z95810 Presence of automatic (implantable) cardiac defibrillator: Secondary | ICD-10-CM

## 2019-08-27 DIAGNOSIS — K5900 Constipation, unspecified: Secondary | ICD-10-CM | POA: Diagnosis present

## 2019-08-27 DIAGNOSIS — I130 Hypertensive heart and chronic kidney disease with heart failure and stage 1 through stage 4 chronic kidney disease, or unspecified chronic kidney disease: Secondary | ICD-10-CM | POA: Diagnosis present

## 2019-08-27 DIAGNOSIS — D869 Sarcoidosis, unspecified: Secondary | ICD-10-CM | POA: Diagnosis present

## 2019-08-27 DIAGNOSIS — G8918 Other acute postprocedural pain: Secondary | ICD-10-CM | POA: Diagnosis present

## 2019-08-27 DIAGNOSIS — B965 Pseudomonas (aeruginosa) (mallei) (pseudomallei) as the cause of diseases classified elsewhere: Secondary | ICD-10-CM | POA: Diagnosis present

## 2019-08-27 DIAGNOSIS — Z79899 Other long term (current) drug therapy: Secondary | ICD-10-CM

## 2019-08-27 DIAGNOSIS — D72829 Elevated white blood cell count, unspecified: Secondary | ICD-10-CM | POA: Diagnosis present

## 2019-08-27 DIAGNOSIS — N189 Chronic kidney disease, unspecified: Secondary | ICD-10-CM

## 2019-08-27 DIAGNOSIS — Z833 Family history of diabetes mellitus: Secondary | ICD-10-CM | POA: Diagnosis not present

## 2019-08-27 DIAGNOSIS — Z9842 Cataract extraction status, left eye: Secondary | ICD-10-CM

## 2019-08-27 DIAGNOSIS — D638 Anemia in other chronic diseases classified elsewhere: Secondary | ICD-10-CM | POA: Diagnosis present

## 2019-08-27 DIAGNOSIS — T8149XD Infection following a procedure, other surgical site, subsequent encounter: Secondary | ICD-10-CM

## 2019-08-27 DIAGNOSIS — I10 Essential (primary) hypertension: Secondary | ICD-10-CM | POA: Diagnosis present

## 2019-08-27 DIAGNOSIS — N183 Chronic kidney disease, stage 3 (moderate): Secondary | ICD-10-CM | POA: Diagnosis present

## 2019-08-27 DIAGNOSIS — T148XXA Other injury of unspecified body region, initial encounter: Secondary | ICD-10-CM | POA: Diagnosis present

## 2019-08-27 DIAGNOSIS — I428 Other cardiomyopathies: Secondary | ICD-10-CM | POA: Diagnosis present

## 2019-08-27 DIAGNOSIS — Z885 Allergy status to narcotic agent status: Secondary | ICD-10-CM

## 2019-08-27 LAB — AEROBIC/ANAEROBIC CULTURE W GRAM STAIN (SURGICAL/DEEP WOUND)

## 2019-08-27 LAB — CBC
HCT: 26.4 % — ABNORMAL LOW (ref 36.0–46.0)
Hemoglobin: 8 g/dL — ABNORMAL LOW (ref 12.0–15.0)
MCH: 28.4 pg (ref 26.0–34.0)
MCHC: 30.3 g/dL (ref 30.0–36.0)
MCV: 93.6 fL (ref 80.0–100.0)
Platelets: 280 10*3/uL (ref 150–400)
RBC: 2.82 MIL/uL — ABNORMAL LOW (ref 3.87–5.11)
RDW: 15.6 % — ABNORMAL HIGH (ref 11.5–15.5)
WBC: 11.7 10*3/uL — ABNORMAL HIGH (ref 4.0–10.5)
nRBC: 0 % (ref 0.0–0.2)

## 2019-08-27 LAB — BASIC METABOLIC PANEL
Anion gap: 9 (ref 5–15)
BUN: 12 mg/dL (ref 6–20)
CO2: 23 mmol/L (ref 22–32)
Calcium: 8.6 mg/dL — ABNORMAL LOW (ref 8.9–10.3)
Chloride: 105 mmol/L (ref 98–111)
Creatinine, Ser: 1.2 mg/dL — ABNORMAL HIGH (ref 0.44–1.00)
GFR calc Af Amer: 58 mL/min — ABNORMAL LOW (ref >60)
GFR calc non Af Amer: 50 mL/min — ABNORMAL LOW (ref >60)
Glucose, Bld: 156 mg/dL — ABNORMAL HIGH (ref 70–99)
Potassium: 3.7 mmol/L (ref 3.5–5.1)
Sodium: 137 mmol/L (ref 135–145)

## 2019-08-27 MED ORDER — SODIUM CHLORIDE 0.9 % IV SOLN
1.0000 g | Freq: Three times a day (TID) | INTRAVENOUS | Status: DC
  Administered 2019-08-27 – 2019-09-02 (×19): 1 g via INTRAVENOUS
  Filled 2019-08-27 (×22): qty 1

## 2019-08-27 MED ORDER — PROCHLORPERAZINE MALEATE 5 MG PO TABS: 5.0000 mg | ORAL_TABLET | Freq: Four times a day (QID) | ORAL | Status: DC | PRN

## 2019-08-27 MED ORDER — ALUM & MAG HYDROXIDE-SIMETH 200-200-20 MG/5ML PO SUSP: 30.0000 mL | ORAL | Status: DC | PRN

## 2019-08-27 MED ORDER — MENTHOL 3 MG MT LOZG
1.0000 | LOZENGE | OROMUCOSAL | Status: DC | PRN
  Filled 2019-08-27: qty 9

## 2019-08-27 MED ORDER — DIPHENHYDRAMINE HCL 12.5 MG/5ML PO ELIX: 12.5000 mg | ORAL_SOLUTION | Freq: Four times a day (QID) | ORAL | Status: DC | PRN

## 2019-08-27 MED ORDER — TRAMADOL HCL 50 MG PO TABS: 50.0000 mg | ORAL_TABLET | Freq: Four times a day (QID) | ORAL | Status: DC | PRN

## 2019-08-27 MED ORDER — ALLOPURINOL 100 MG PO TABS
200.0000 mg | ORAL_TABLET | Freq: Every day | ORAL | Status: DC
  Administered 2019-08-28 – 2019-09-03 (×7): 200 mg via ORAL
  Filled 2019-08-27 (×8): qty 2

## 2019-08-27 MED ORDER — ALBUTEROL SULFATE (2.5 MG/3ML) 0.083% IN NEBU: 3.0000 mL | INHALATION_SOLUTION | Freq: Four times a day (QID) | RESPIRATORY_TRACT | Status: DC | PRN

## 2019-08-27 MED ORDER — POLYETHYLENE GLYCOL 3350 17 G PO PACK
17.0000 g | PACK | Freq: Every day | ORAL | Status: DC
  Administered 2019-08-29 – 2019-09-01 (×4): 17 g via ORAL
  Filled 2019-08-27 (×7): qty 1

## 2019-08-27 MED ORDER — FLEET ENEMA 7-19 GM/118ML RE ENEM: 1.0000 | ENEMA | Freq: Once | RECTAL | Status: DC | PRN

## 2019-08-27 MED ORDER — METHOCARBAMOL 500 MG PO TABS
500.0000 mg | ORAL_TABLET | Freq: Four times a day (QID) | ORAL | Status: DC | PRN
  Filled 2019-08-27: qty 1

## 2019-08-27 MED ORDER — TRAZODONE HCL 50 MG PO TABS
25.0000 mg | ORAL_TABLET | Freq: Every evening | ORAL | Status: DC | PRN
  Administered 2019-08-29 – 2019-09-02 (×3): 50 mg via ORAL
  Filled 2019-08-27 (×4): qty 1

## 2019-08-27 MED ORDER — ACETAMINOPHEN 325 MG PO TABS
325.0000 mg | ORAL_TABLET | ORAL | Status: DC | PRN
  Filled 2019-08-27: qty 2

## 2019-08-27 MED ORDER — CARVEDILOL 6.25 MG PO TABS
6.2500 mg | ORAL_TABLET | Freq: Two times a day (BID) | ORAL | Status: DC
  Administered 2019-08-27 – 2019-09-03 (×14): 6.25 mg via ORAL
  Filled 2019-08-27 (×14): qty 1

## 2019-08-27 MED ORDER — PROCHLORPERAZINE 25 MG RE SUPP: 12.5000 mg | Freq: Four times a day (QID) | RECTAL | Status: DC | PRN

## 2019-08-27 MED ORDER — CIPROFLOXACIN HCL 500 MG PO TABS: 500.0000 mg | ORAL_TABLET | Freq: Two times a day (BID) | ORAL | 0 refills | Status: DC

## 2019-08-27 MED ORDER — SODIUM CHLORIDE 0.9 % IV SOLN
1.0000 g | Freq: Three times a day (TID) | INTRAVENOUS | Status: DC
  Filled 2019-08-27 (×3): qty 1

## 2019-08-27 MED ORDER — CEFTAZIDIME 1 G IJ SOLR: 1.0000 g | Freq: Three times a day (TID) | INTRAMUSCULAR | Status: DC

## 2019-08-27 MED ORDER — DOCUSATE SODIUM 100 MG PO CAPS
100.0000 mg | ORAL_CAPSULE | Freq: Two times a day (BID) | ORAL | Status: DC
  Administered 2019-08-28 – 2019-08-30 (×4): 100 mg via ORAL
  Filled 2019-08-27 (×6): qty 1

## 2019-08-27 MED ORDER — METRONIDAZOLE 500 MG PO TABS
500.0000 mg | ORAL_TABLET | Freq: Four times a day (QID) | ORAL | Status: DC
  Administered 2019-08-27 – 2019-09-03 (×28): 500 mg via ORAL
  Filled 2019-08-27 (×29): qty 1

## 2019-08-27 MED ORDER — ENOXAPARIN SODIUM 40 MG/0.4ML ~~LOC~~ SOLN
40.0000 mg | SUBCUTANEOUS | Status: DC
  Administered 2019-08-28 – 2019-08-29 (×2): 40 mg via SUBCUTANEOUS
  Filled 2019-08-27 (×2): qty 0.4

## 2019-08-27 MED ORDER — GUAIFENESIN-DM 100-10 MG/5ML PO SYRP: 5.0000 mL | ORAL_SOLUTION | Freq: Four times a day (QID) | ORAL | Status: DC | PRN

## 2019-08-27 MED ORDER — PROCHLORPERAZINE EDISYLATE 10 MG/2ML IJ SOLN: 5.0000 mg | Freq: Four times a day (QID) | INTRAMUSCULAR | Status: DC | PRN

## 2019-08-27 MED ORDER — OXYCODONE HCL 5 MG PO TABS
10.0000 mg | ORAL_TABLET | ORAL | Status: DC | PRN
  Administered 2019-08-27: 10 mg via ORAL
  Administered 2019-08-28 – 2019-08-29 (×7): 15 mg via ORAL
  Administered 2019-08-30 – 2019-08-31 (×3): 10 mg via ORAL
  Administered 2019-09-01: 15 mg via ORAL
  Administered 2019-09-01 – 2019-09-03 (×5): 10 mg via ORAL
  Filled 2019-08-27 (×2): qty 2
  Filled 2019-08-27 (×6): qty 3
  Filled 2019-08-27 (×6): qty 2
  Filled 2019-08-27 (×4): qty 3

## 2019-08-27 MED ORDER — CIPROFLOXACIN HCL 500 MG PO TABS: 500.0000 mg | ORAL_TABLET | Freq: Two times a day (BID) | ORAL | Status: DC

## 2019-08-27 MED ORDER — BISACODYL 10 MG RE SUPP
10.0000 mg | Freq: Every day | RECTAL | Status: DC | PRN
  Administered 2019-08-31: 10 mg via RECTAL
  Filled 2019-08-27: qty 1

## 2019-08-27 MED ORDER — CIPROFLOXACIN HCL 500 MG PO TABS
500.0000 mg | ORAL_TABLET | Freq: Two times a day (BID) | ORAL | Status: DC
  Administered 2019-08-27: 500 mg via ORAL
  Filled 2019-08-27: qty 1

## 2019-08-27 MED ORDER — POLYETHYLENE GLYCOL 3350 17 G PO PACK
17.0000 g | PACK | Freq: Every day | ORAL | Status: DC | PRN
  Administered 2019-09-01: 17 g via ORAL

## 2019-08-27 NOTE — Progress Notes (Signed)
Received secure chat message from ID pharmacist--wound cultures finalized today and pseudomonas in wound shows intermediate sensitivity to cipro. Ceftazidine + metronidazole recommended for coverage. Left message with Dr. Jess Barters office regarding need to change regimen as well as questions on duration of Tx.

## 2019-08-27 NOTE — Anesthesia Postprocedure Evaluation (Signed)
Anesthesia Post Note  Patient: Kari [name]  Procedure(s) Performed: SKIN GRAFT SPLIT THICKNESS BILATERAL LEGS, APPLY WOUND VAC (Bilateral )     Patient location during evaluation: PACU Anesthesia Type: General Level of consciousness: awake and alert Pain management: pain level controlled Vital Signs Assessment: post-procedure vital signs reviewed and stable Respiratory status: spontaneous breathing, nonlabored ventilation, respiratory function stable and patient connected to nasal cannula oxygen Cardiovascular status: blood pressure returned to baseline and stable Postop Assessment: no apparent nausea or vomiting Anesthetic complications: no    Last Vitals:  Vitals:   08/27/19 0206 08/27/19 0543  BP: (!) 95/55 (!) 103/53  Pulse: 70 68  Resp: 17 16  Temp: 36.9 C 37 C  SpO2: 96% 96%    Last Pain:  Vitals:   08/27/19 0543  TempSrc: Oral  PainSc:                  Catalina Gravel

## 2019-08-27 NOTE — Progress Notes (Addendum)
Report given to [place], RN on 4W.   Patient transported to 4W.

## 2019-08-27 NOTE — Consult Note (Signed)
   [facility] Inpatient Consult   08/27/2019  Kari [name] Jul 04, 1961 [id number]    Update Note:  Patient being followed for dispositionand needsfrom [facility] managementservices for community follow-up as a benefit ofher Medicare/ [employer].  Noted change in disposition to CIR [facility] Inpatient Rehab) today. Inpatient Rehab admissions coordinator note shows that arrangements done for patient's discharge to CIR today as preferred by patient.  Will follow for disposition and progress.  Ifthere are any changesin disposition/ needs for appropriate community follow-up, please referto [facility] care management.  Of note, [facility] Care Management services does not replace or interfere with any servicesarranged by inpatient transition of care CM or social work.   For questions and referral, please call:  Edwena Felty A. Kayleanna Lorman, BSN, RN-BC [facility] Liaison Cell: [phone]

## 2019-08-27 NOTE — Discharge Summary (Signed)
Physician Discharge Summary  SAQUOIA SIANEZ VXB:[id number] DOB: 06-20-1961 DOA: 08/19/2019  PCP: Rutherford Guys, MD  Admit date: 08/19/2019 Discharge date: 08/27/2019  Admitted From: Home Disposition: CIR  Recommendations for Outpatient Follow-up:  1. Follow up with PCP in 1-2 weeks, orthopedics within 1 to 2 weeks per schedule for wound evaluation, wound VAC check and suture removal. 2. 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 Sharol Given and she was discharged to inpatient rehab presents today from home for worsening lower extremity wounds with increased drainage and foul smell and worsening pain. Patient denies any fevers or chills, denies chest pain shortness of breath or cough, denies any nausea vomiting abdominal pain, dysuria, hematochezia or hematemesis. She is able to walk with assistance and pain is a limiting factor for ambulation.On arrival to ED she was found to be afebrile normotensive. Labs revealed AKI with creatinine of 3.04, sodium of 131, glucose of 120, BUN of 54, lactic acid was 1.7, WBC count 16.1, hemoglobin of 10.9. Blood cultures were drawn and are pending. EKG shows sinus rhythm with prolonged PR interval. Orthopedics consulted,Dr. Sharol Given requested to transfer the [facility] to [facility] for possible [facility]&D on Friday. She was referred to medical service/TR[facility] for evaluation and management of chronic nonhealing lower extremity venous ulcers  Patient met as above for further evaluation with orthopedic surgery given  nonhealing bilateral lower extremity venous stasis ulcers with failed skin grafting with concern for infection.  Patient evaluated by Dr. Sharol Given, recurrent debridement with wound VAC placement on 08/23/2019 quite successful, patient had repeat skin graft placement and wound VAC replacement on 08/26/2019.  Patient otherwise tolerated procedure quite well, remains afebrile without leukocytosis.  Cultures positive for Pseudomonas and Proteus, per discussion with pharmacy will de-escalate to Cipro per cultures, continue per orthopedic commendations.  Currently patient tolerating p.o. well, ambulating with some difficulty but improving daily, will follow with CIR for further evaluation and treatment with hopeful disposition home in the next few days to week.  Defer to orthopedics for further pain management, DVT prophylaxis.  Continue Cipro for 4 weeks.  Follow-up with PCP in 1 to 2 weeks for post hospital visit to ensure comorbid conditions as below remain well controlled.  Discharge Diagnoses:  Active Problems:   Asthma   Venous (peripheral) insufficiency   Morbid obesity (HCC)   Anemia of chronic disease   Acute kidney injury ([name])   CKD (chronic kidney disease), stage III (HCC)   H/O skin graft   Non-healing ulcer ([name])   Multiple open wounds of lower leg   Moderate protein-calorie malnutrition [facility])  Discharge Instructions Discharge Instructions    Call MD for:  difficulty breathing, headache or visual disturbances   Complete by: As directed    Call MD for:  extreme fatigue   Complete by: As directed    Call MD for:  hives   Complete by: As directed    Call MD for:  persistant dizziness or light-headedness   Complete by: As directed    Call MD for:  persistant nausea and vomiting   Complete by: As directed    Call MD  for:  redness, tenderness, or signs of infection (pain, swelling, redness, odor or green/yellow discharge around incision site)   Complete by: As directed    Call MD for:  severe  uncontrolled pain   Complete by: As directed    Call MD for:  temperature >100.4   Complete by: As directed    Diet - low sodium heart healthy   Complete by: As directed    Increase activity slowly   Complete by: As directed      Allergies as of 08/27/2019      Reactions   Tramadol Other (See Comments)   hallucination      Medication List    STOP taking these medications   methocarbamol 500 MG tablet Commonly known as: ROBAXIN   oxyCODONE 5 MG immediate release tablet Commonly known as: Oxy IR/ROXICODONE   potassium chloride SA 15 MEQ tablet Commonly known as: KLOR-CON M15     TAKE these medications   acetaminophen 325 MG tablet Commonly known as: TYLENOL Take 1-2 tablets (325-650 mg total) by mouth every 4 (four) hours as needed for mild pain.   albuterol 108 (90 Base) MCG/ACT inhaler Commonly known as: VENTOLIN HFA Inhale 2 puffs into the lungs every 6 (six) hours as needed for shortness of breath.   allopurinol 100 MG tablet Commonly known as: ZYLOPRIM Take 2 tablets (200 mg total) by mouth daily. What changed:   how much to take  when to take this   aspirin EC 81 MG tablet Take 81 mg by mouth daily.   carvedilol 6.25 MG tablet Commonly known as: COREG Take 1 tablet (6.25 mg total) by mouth 2 (two) times daily.   ciprofloxacin 500 MG tablet Commonly known as: CIPRO Take 1 tablet (500 mg total) by mouth 2 (two) times daily for 28 days.   docusate sodium 100 MG capsule Commonly known as: COLACE Take 1 capsule (100 mg total) by mouth 2 (two) times daily.   furosemide 40 MG tablet Commonly known as: LASIX Take 1.5 tablets (60 mg total) by mouth daily. Take 1-1/2 tablets to = 60 mg What changed: additional instructions   potassium chloride 10 MEQ tablet Commonly known as: K-DUR Take 30 mEq by mouth daily.      Follow-up Information    [name], MD In 1 week.   Specialty: Orthopedic Surgery Contact information: [address] [place] [address] [phone]          Allergies  Allergen Reactions  . Tramadol Other (See Comments)    hallucination    Consultations:  Orthopedic surgery, Dr. Sharol Given   Procedures/Studies: Dg Tibia/fibula Left  Result Date: 08/19/2019 CLINICAL DATA:  Recent irrigation and debridement EXAM: LEFT TIBIA AND FIBULA - 2 VIEW COMPARISON:  None. FINDINGS: Diffuse lower extremity edema with ulceration and numerous overlying skin staples. Scattered soft tissue calcifications are similar in distribution to prior exam. The tibia and fibula are intact. No erosive change or periostitis to suggest early radiographic features of osteomyelitis. Degenerative changes at the knee and ankle are similar to comparison and incompletely evaluated on these nondedicated radiographs. Neuropathic changes are noted in the left foot. IMPRESSION: Diffuse lower extremity edema with ulceration and numerous overlying skin staples. No radiographic evidence of osteomyelitis. Electronically Signed   By: Lovena Le M.D.   On: 08/19/2019 16:55   Dg Tibia/fibula Right  Result Date: 08/19/2019 CLINICAL DATA:  Recent irrigation and drainage EXAM: RIGHT TIBIA AND FIBULA - 2 VIEW COMPARISON:  Radiographs  02/20/2018 FINDINGS: Extensive lower extremity edema with postsurgical soft tissue changes and multiple overlying skin staples of the lower leg. Mineralization is within the soft tissues are similar to prior exams. There is stable appearance of wavy periosteal thickening of the proximal femur though new periosteal reaction is present along the lateral aspect of the distal tibia as indicated by air on image. Extensive degenerative changes are present at the knee and ankle similar to prior and incompletely evaluated on these nondedicated radiographs. Plantar and posterior calcaneal spurs are present. IMPRESSION: New area of periosteal elevation and thickening along the lateral tibia. Could reflect postsurgical change  versus early osteomyelitis. Correlate with symptoms and surgical history. Electronically Signed   By: Lovena Le M.D.   On: 08/19/2019 16:53   US Renal  Result Date: 08/19/2019 CLINICAL DATA:  Acute kidney injury. EXAM: RENAL / URINARY TRACT ULTRASOUND COMPLETE COMPARISON:  CT abdomen dated August 04, 2018. FINDINGS: Right Kidney: Renal measurements: 12.2 x 4.3 x 4.6 cm = volume: 126 mL. Increased echogenicity. Unchanged mild hydronephrosis. No mass visualized. Left Kidney: Renal measurements: 12.0 x 5.1 x 4.2 cm = volume: 135 mL. Increased echogenicity. No mass or hydronephrosis visualized. Bladder: Decompressed. Large cystic mass within the abdomen again noted, better evaluated on prior CTs. IMPRESSION: 1. Unchanged mild right hydronephrosis. 2. Increased renal echogenicity, suggestive of underlying medical renal disease. 3. Large cystic mass within the abdomen again noted, thought to be arising from the left ovary based on prior CTs. Electronically Signed   By: Titus Dubin M.D.   On: 08/19/2019 16:34    Subjective: No acute issues or events overnight, tolerating p.o. well, ambulating with PT, being evaluated by CIR currently at bedside, excited for possible discharge later today.  Denies chest pain, shortness of breath, nausea, vomiting, diarrhea, constipation, headache, fevers, chills.   Discharge Exam: Vitals:   08/27/19 0816 08/27/19 1033  BP: (!) 104/45 (!) 101/54  Pulse: 78 78  Resp: 17   Temp: 98.5 F (36.9 C)   SpO2: 100%    Vitals:   08/27/19 0206 08/27/19 0543 08/27/19 0816 08/27/19 1033  BP: (!) 95/55 (!) 103/53 (!) 104/45 (!) 101/54  Pulse: 70 68 78 78  Resp: _0 Temp: 98.5 F (36.9 C) 98.6 F (37 C) 98.5 F (36.9 C)   TempSrc: Oral Oral Oral   SpO2: 96% 96% 100%   Weight:      Height:        General:  Pleasantly resting in bed, No acute distress. HEENT:  Normocephalic atraumatic.  Sclerae nonicteric, noninjected.  Extraocular movements intact  bilaterally. Neck:  Without mass or deformity.  Trachea is midline. Lungs:  Clear to auscultate bilaterally without rhonchi, wheeze, or rales. Heart:  Regular rate and rhythm.  Without murmurs, rubs, or gallops. Abdomen:  Soft, nontender, nondistended.  Without guarding or rebound. Extremities: Bilateral foot bandages clean dry intact, wound VAC intact without overt leakage   The results of significant diagnostics from this hospitalization (including imaging, microbiology, ancillary and laboratory) are listed below for reference.     Microbiology: Recent Results (from the past 240 hour(s))  Culture, blood (routine x 2)     Status: None   Collection Time: 08/19/19 11:27 AM   Specimen: BLOOD  Result Value Ref Range Status   Specimen Description   Final    BLOOD RIGHT ANTECUBITAL Performed at [facility], [place] [address], [place], [place] [address]    Special Requests   Final  BOTTLES DRAWN AEROBIC AND ANAEROBIC Blood Culture adequate volume Performed at [place] [facility] [address], [place], [place] [address]    Culture   Final    NO GROWTH 5 DAYS Performed at [facility], [place] [address] [place], [place] [address]    Report Status 08/24/2019 FINAL  Final  SARS CORONAVIRUS 2 (TAT 6-12 HRS) Nasal Swab Aptima Multi Swab     Status: None   Collection Time: 08/19/19 11:28 AM   Specimen: Aptima Multi Swab; Nasal Swab  Result Value Ref Range Status   SARS Coronavirus 2 NEGATIVE NEGATIVE Final    Comment: (NOTE) SARS-CoV-2 target nucleic acids are NOT DETECTED. The SARS-CoV-2 RNA is generally detectable in upper and lower respiratory specimens during the acute phase of infection. Negative results do not preclude SARS-CoV-2 infection, do not rule out co-infections with other pathogens, and should not be used as the sole basis for treatment or other patient management decisions. Negative results must be combined with clinical observations, patient  history, and epidemiological information. The expected result is Negative. Fact Sheet for Patients: SugarRoll.be Fact Sheet for Healthcare Providers: https://www.woods-mathews.com/ This test is not yet approved or cleared by the [place] FDA and  has been authorized for detection and/or diagnosis of SARS-CoV-2 by FDA under an Emergency Use Authorization (EUA). This EUA will remain  in effect (meaning this test can be used) for the duration of the COVID-19 declaration under Section 56 4(b)(1) of the Act, 21 U.S.C. section 360bbb-3(b)(1), unless the authorization is terminated or revoked sooner. Performed at [facility], [place] [address], [place], [place] [address]   Culture, blood (routine x 2)     Status: None   Collection Time: 08/19/19 11:32 AM   Specimen: BLOOD  Result Value Ref Range Status   Specimen Description   Final    BLOOD LEFT ANTECUBITAL Performed at [facility], [place] [address], [place], [place] [address]    Special Requests   Final    BOTTLES DRAWN AEROBIC AND ANAEROBIC Blood Culture adequate volume Performed at [place] [address], [place], [place] [address]    Culture   Final    NO GROWTH 5 DAYS Performed at [facility], [place] [address] [place], [place] [address]    Report Status 08/24/2019 FINAL  Final  Surgical pcr screen     Status: None   Collection Time: 08/22/19  2:59 PM   Specimen: Nasal Mucosa; Nasal Swab  Result Value Ref Range Status   MRSA, PCR NEGATIVE NEGATIVE Final   Staphylococcus aureus NEGATIVE NEGATIVE Final    Comment: (NOTE) The Xpert SA Assay (FDA approved for NASAL specimens in patients 75 years of age and older), is one component of a comprehensive surveillance program. It is not intended to diagnose infection nor to guide or monitor treatment. Performed at [facility], [place] [address] [place], [place] [address]   Aerobic/Anaerobic  Culture (surgical/deep wound)     Status: None (Preliminary result)   Collection Time: 08/23/19  8:28 AM   Specimen: Soft Tissue, Other  Result Value Ref Range Status   Specimen Description TISSUE RIGHT LEG  Final   Special Requests A  Final   Gram Stain   Final    MODERATE WBC PRESENT, PREDOMINANTLY PMN RARE GRAM POSITIVE COCCI IN PAIRS RARE GRAM NEGATIVE RODS    Culture   Final    FEW PSEUDOMONAS AERUGINOSA HOLDING FOR POSSIBLE ANAEROBE Performed  at [facility], [place] [address], [place], [place] [address]    Report Status PENDING  Incomplete   Organism ID, Bacteria PSEUDOMONAS AERUGINOSA  Final      Susceptibility   Pseudomonas aeruginosa - MIC*    CEFTAZIDIME 2 SENSITIVE Sensitive     CIPROFLOXACIN 2 INTERMEDIATE Intermediate     GENTAMICIN 2 SENSITIVE Sensitive     IMIPENEM 1 SENSITIVE Sensitive     CEFEPIME 4 SENSITIVE Sensitive     * FEW PSEUDOMONAS AERUGINOSA  Aerobic/Anaerobic Culture (surgical/deep wound)     Status: None (Preliminary result)   Collection Time: 08/23/19  8:30 AM   Specimen: Soft Tissue, Other  Result Value Ref Range Status   Specimen Description TISSUE LEFT LEG  Final   Special Requests B  Final   Gram Stain   Final    MODERATE WBC PRESENT, PREDOMINANTLY PMN FEW GRAM POSITIVE COCCI IN PAIRS IN CLUSTERS RARE GRAM NEGATIVE RODS Performed at [facility], [place] [address], [place], [place] [address]    Culture   Final    FEW PSEUDOMONAS AERUGINOSA FEW PROTEUS MIRABILIS FEW STAPHYLOCOCCUS SPECIES (COAGULASE NEGATIVE) NO ANAEROBES ISOLATED; CULTURE IN PROGRESS FOR 5 DAYS    Report Status PENDING  Incomplete   Organism ID, Bacteria PSEUDOMONAS AERUGINOSA  Final   Organism ID, Bacteria PROTEUS MIRABILIS  Final      Susceptibility   Pseudomonas aeruginosa - MIC*    CEFTAZIDIME 2 SENSITIVE Sensitive     CIPROFLOXACIN 1 SENSITIVE Sensitive     GENTAMICIN <=1 SENSITIVE Sensitive     IMIPENEM 2 SENSITIVE Sensitive     PIP/TAZO 8 SENSITIVE  Sensitive     CEFEPIME <=1 SENSITIVE Sensitive     * FEW PSEUDOMONAS AERUGINOSA   Proteus mirabilis - MIC*    AMPICILLIN <=2 SENSITIVE Sensitive     CEFAZOLIN <=4 SENSITIVE Sensitive     CEFEPIME <=1 SENSITIVE Sensitive     CEFTAZIDIME <=1 SENSITIVE Sensitive     CEFTRIAXONE <=1 SENSITIVE Sensitive     CIPROFLOXACIN <=0.25 SENSITIVE Sensitive     GENTAMICIN <=1 SENSITIVE Sensitive     IMIPENEM 4 SENSITIVE Sensitive     TRIMETH/SULFA <=20 SENSITIVE Sensitive     AMPICILLIN/SULBACTAM <=2 SENSITIVE Sensitive     PIP/TAZO <=4 SENSITIVE Sensitive     * FEW PROTEUS MIRABILIS     Labs: Basic Metabolic Panel: Recent Labs  Lab 08/23/19 0232 08/24/19 0235 08/25/19 0159 08/26/19 0505 08/27/19 0314  NA 139 138 138 137 137  K 3.2* 3.9 3.3* 4.1 3.7  CL 108 109 108 106 105  CO2 _0 GLUCOSE 106* 156* 114* 106* 156*  BUN _1 CREATININE 1.04* 1.36* 1.32* 0.95 1.20*  CALCIUM 8.4* 8.5* 8.5* 8.6* 8.6*    CBC: Recent Labs  Lab 08/23/19 0232 08/24/19 0235 08/25/19 0159 08/26/19 0505 08/27/19 0314  WBC 7.7 8.0 8.0 8.4 11.7*  HGB 8.5* 7.9* 7.9* 8.4* 8.0*  HCT 28.2* 26.8* 26.5* 27.6* 26.4*  MCV 92.8 94.7 93.6 93.2 93.6  PLT 304 297 270 280 280    Urinalysis    Component Value Date/Time   COLORURINE YELLOW 08/19/2019 1600   APPEARANCEUR HAZY (A) 08/19/2019 1600   LABSPEC 1.014 08/19/2019 1600   PHURINE 5.0 08/19/2019 1600   GLUCOSEU NEGATIVE 08/19/2019 1600   HGBUR NEGATIVE 08/19/2019 1600   BILIRUBINUR NEGATIVE 08/19/2019 1600   KETONESUR NEGATIVE 08/19/2019 1600   PROTEINUR NEGATIVE 08/19/2019 1600  UROBILINOGEN 0.2 04/10/2014 0949   NITRITE NEGATIVE 08/19/2019 1600   LEUKOCYTESUR NEGATIVE 08/19/2019 1600   Sepsis Labs Invalid input(s): PROCALCITONIN,  WBC,  LACTICIDVEN Microbiology Recent Results (from the past 240 hour(s))  Culture, blood (routine x 2)     Status: None   Collection Time: 08/19/19 11:27 AM   Specimen: BLOOD  Result Value Ref  Range Status   Specimen Description   Final    BLOOD RIGHT ANTECUBITAL Performed at [facility], [place] [address] [place], [place] [address]    Special Requests   Final    BOTTLES DRAWN AEROBIC AND ANAEROBIC Blood Culture adequate volume Performed at [place] [address], [place], [place] [address]    Culture   Final    NO GROWTH 5 DAYS Performed at [facility], [place] [address], [place], [place] [address]    Report Status 08/24/2019 FINAL  Final  SARS CORONAVIRUS 2 (TAT 6-12 HRS) Nasal Swab Aptima Multi Swab     Status: None   Collection Time: 08/19/19 11:28 AM   Specimen: Aptima Multi Swab; Nasal Swab  Result Value Ref Range Status   SARS Coronavirus 2 NEGATIVE NEGATIVE Final    Comment: (NOTE) SARS-CoV-2 target nucleic acids are NOT DETECTED. The SARS-CoV-2 RNA is generally detectable in upper and lower respiratory specimens during the acute phase of infection. Negative results do not preclude SARS-CoV-2 infection, do not rule out co-infections with other pathogens, and should not be used as the sole basis for treatment or other patient management decisions. Negative results must be combined with clinical observations, patient history, and epidemiological information. The expected result is Negative. Fact Sheet for Patients: SugarRoll.be Fact Sheet for Healthcare Providers: https://www.woods-mathews.com/ This test is not yet approved or cleared by the [place] FDA and  has been authorized for detection and/or diagnosis of SARS-CoV-2 by FDA under an Emergency Use Authorization (EUA). This EUA will remain  in effect (meaning this test can be used) for the duration of the COVID-19 declaration under Section 56 4(b)(1) of the Act, 21 U.S.C. section 360bbb-3(b)(1), unless the authorization is terminated or revoked sooner. Performed at [facility], [place] [address], [place],  [place] [address]   Culture, blood (routine x 2)     Status: None   Collection Time: 08/19/19 11:32 AM   Specimen: BLOOD  Result Value Ref Range Status   Specimen Description   Final    BLOOD LEFT ANTECUBITAL Performed at [facility], [place] [address], [place], [place] [address]    Special Requests   Final    BOTTLES DRAWN AEROBIC AND ANAEROBIC Blood Culture adequate volume Performed at [place] [address], [place], [place] [address]    Culture   Final    NO GROWTH 5 DAYS Performed at [facility], [place] [address], [place], [place] [address]    Report Status 08/24/2019 FINAL  Final  Surgical pcr screen     Status: None   Collection Time: 08/22/19  2:59 PM   Specimen: Nasal Mucosa; Nasal Swab  Result Value Ref Range Status   MRSA, PCR NEGATIVE NEGATIVE Final   Staphylococcus aureus NEGATIVE NEGATIVE Final    Comment: (NOTE) The Xpert SA Assay (FDA approved for NASAL specimens in patients 71 years of age and older), is one component of a comprehensive surveillance program. It is not intended to diagnose infection nor to guide or monitor treatment. Performed at [facility], [address], [place], [place] [address]   Aerobic/Anaerobic Culture (surgical/deep wound)     Status: None (Preliminary result)   Collection Time: 08/23/19  8:28 AM   Specimen: Soft Tissue, Other  Result Value Ref Range Status   Specimen Description TISSUE RIGHT LEG  Final   Special Requests A  Final   Gram Stain   Final    MODERATE WBC PRESENT, PREDOMINANTLY PMN RARE GRAM POSITIVE COCCI IN PAIRS RARE GRAM NEGATIVE RODS    Culture   Final    FEW PSEUDOMONAS AERUGINOSA HOLDING FOR POSSIBLE ANAEROBE Performed at [facility], [name], [place], [place] [address]    Report Status PENDING  Incomplete   Organism ID, Bacteria PSEUDOMONAS AERUGINOSA  Final      Susceptibility   Pseudomonas aeruginosa - MIC*    CEFTAZIDIME 2 SENSITIVE Sensitive      CIPROFLOXACIN 2 INTERMEDIATE Intermediate     GENTAMICIN 2 SENSITIVE Sensitive     IMIPENEM 1 SENSITIVE Sensitive     CEFEPIME 4 SENSITIVE Sensitive     * FEW PSEUDOMONAS AERUGINOSA  Aerobic/Anaerobic Culture (surgical/deep wound)     Status: None (Preliminary result)   Collection Time: 08/23/19  8:30 AM   Specimen: Soft Tissue, Other  Result Value Ref Range Status   Specimen Description TISSUE LEFT LEG  Final   Special Requests B  Final   Gram Stain   Final    MODERATE WBC PRESENT, PREDOMINANTLY PMN FEW GRAM POSITIVE COCCI IN PAIRS IN CLUSTERS RARE GRAM NEGATIVE RODS Performed at [facility], [place] [address], [place], [place] [address]    Culture   Final    FEW PSEUDOMONAS AERUGINOSA FEW PROTEUS MIRABILIS FEW STAPHYLOCOCCUS SPECIES (COAGULASE NEGATIVE) NO ANAEROBES ISOLATED; CULTURE IN PROGRESS FOR 5 DAYS    Report Status PENDING  Incomplete   Organism ID, Bacteria PSEUDOMONAS AERUGINOSA  Final   Organism ID, Bacteria PROTEUS MIRABILIS  Final      Susceptibility   Pseudomonas aeruginosa - MIC*    CEFTAZIDIME 2 SENSITIVE Sensitive     CIPROFLOXACIN 1 SENSITIVE Sensitive     GENTAMICIN <=1 SENSITIVE Sensitive     IMIPENEM 2 SENSITIVE Sensitive     PIP/TAZO 8 SENSITIVE Sensitive     CEFEPIME <=1 SENSITIVE Sensitive     * FEW PSEUDOMONAS AERUGINOSA   Proteus mirabilis - MIC*    AMPICILLIN <=2 SENSITIVE Sensitive     CEFAZOLIN <=4 SENSITIVE Sensitive     CEFEPIME <=1 SENSITIVE Sensitive     CEFTAZIDIME <=1 SENSITIVE Sensitive     CEFTRIAXONE <=1 SENSITIVE Sensitive     CIPROFLOXACIN <=0.25 SENSITIVE Sensitive     GENTAMICIN <=1 SENSITIVE Sensitive     IMIPENEM 4 SENSITIVE Sensitive     TRIMETH/SULFA <=20 SENSITIVE Sensitive     AMPICILLIN/SULBACTAM <=2 SENSITIVE Sensitive     PIP/TAZO <=4 SENSITIVE Sensitive     * FEW PROTEUS MIRABILIS   Time coordinating discharge: Over 30 minutes  SIGNED:   Little Ishikawa, DO [facility] 08/27/2019, 11:30  AM Pager   If 7PM-7AM, please contact night-coverage [email or web address] Password [email or web address]

## 2019-08-27 NOTE — Progress Notes (Signed)
Meredith Staggers, MD  Physician  Physical Medicine and Rehabilitation  PMR Pre-admission  Signed  Date of Service:  08/27/2019 9:50 AM      Related encounter: ED to Hosp-Admission (Discharged) from 08/19/2019 in [place]         Show:Clear all _0 Manual_1 Template_2 Copied  Added by: _3 Cristina Gong, RN_4 Meredith Staggers, MD  _5 Hover for details PMR Admission Coordinator Pre-Admission Assessment  Patient: Kari [name] is an 58 y.o., female MRN: [id number] DOB: 1961-02-03 Height: 5' 7.01" (170.2 cm) Weight: 125.8 kg  Insurance Information HMO:     PPO:      PCP:      IPA:      80/20:      OTHER:  PRIMARY: Medicare a and b      Policy#: [id number]      Subscriber: pt Benefits:  Phone #: passport one online     Name: 08/27/2019 Eff. Date: 10/25/2015     Deduct: $1408      Out of Pocket Max: none      Life Max: none CIR: 100%      SNF: 20 full days Outpatient: 80%     Co-Pay: 20% Home Health: 100%      Co-Pay: none DME: 80%     Co-Pay: 20% Providers: pt choice  SECONDARY: Medicaid of [place]      Policy#: [id number]      Subscriber: pt Benefits:  Phone #: passport one      Name: 9/3 Eff. Date: active MADQN      Medicaid Application Date:       Case Manager:  Disability Application Date:       Case Worker:   The "Data Collection Information Summary" for patients in Inpatient Rehabilitation Facilities with attached "Privacy Act [place] Records" was provided and verbally reviewed with: Patient  Emergency Contact Information         Contact Information    Name Relation Home Work Mobile   Kolbee, Stallman Sister [phone]     Passion, Lavin Daughter   [phone]      Current Medical History  Patient Admitting Diagnosis: debility  History of Present Illness: 58 year old female with medical history significant for chronic diastolic heart failure, asthma, hypertension,. Stage  III CKD. Chronic bilateral LE ulcers for 2 years who recently underwent I and D of BLE ulcers with skin grafts on 07/17/2019 by Dr. Sharol Given. Presented on 08/19/2019 from home with worsening LE wounds with increased drainage and foul smell and worsening pain. On arrival with AKI with creatinine 3.04, WBC 16.1. Blood cultures drawn and Dr. Sharol Given consulted.   Patient placed on Vancomycin and Zosyn with plans to follow up on cultures. Dr. Sharol Given performed debridement with wound VAC placement on 08/22/3029 and on 08/26/2019 with skin grafts and applied BLE wound VACS. Pain management ongoing and well controlled.   History of nonischemic cardiomyopathy status post ICD. Lasix held given AKI. Hypertension well controlled with Coreg  AKI stage III CKD. Felt secondary to ATN versus poor oral intake/dehydration. Baseline Creat felt to be 1.2 to 1.3. Today is 1.2. Hgb trending down with Hgb today 8.0. Lovenox for DVT prophylaxis.     Patient's medical record from [facility]  has been reviewed by the rehabilitation admission coordinator and physician.  Past Medical History      Past Medical History:  Diagnosis Date  . Arthritis   . Asthma   .  CHF (congestive heart failure) ([place]) 03/12/2014   a. EF 40-45% by echo in 06/2017 with normal cors by cath. ICD placed following VT arrest  . Depression   . Headache(784.0)   . History of blood transfusion   . Hyperlipidemia   . Hypertension   . Lung nodules   . Myocardial infarction ([place]) 06/26/2014  . Renal disorder    followed by [facility]  . Sarcoidosis   . Ulcer    recurring, from chronic venous insufficiency    Family History   family history includes Cancer in her mother; Diabetes Mellitus II in her father and mother; Heart disease in her father; Heart failure in her mother; Hypertension in her mother; Stroke in her father.  Prior Rehab/Hospitalizations Has the patient had prior rehab or hospitalizations prior to admission?  Yes  CIR 07/2019 for 5 days and d/c'd home at Mod I level  Has the patient had major surgery during 100 days prior to admission? Yes             Current Medications  Current Facility-Administered Medications:  .  0.9 %  sodium chloride infusion, , Intravenous, Continuous, [name], MD .  acetaminophen (TYLENOL) tablet 325-650 mg, 325-650 mg, Oral, Q6H PRN, [name], MD .  albuterol (PROVENTIL) (2.5 MG/3ML) 0.083% nebulizer solution 3 mL, 3 mL, Inhalation, Q6H PRN, [name], MD .  allopurinol (ZYLOPRIM) tablet 200 mg, 200 mg, Oral, Daily, [name], MD, 200 mg at 08/27/19 1024 .  bisacodyl (DULCOLAX) suppository 10 mg, 10 mg, Rectal, Daily PRN, [name], MD .  carvedilol (COREG) tablet 6.25 mg, 6.25 mg, Oral, BID, [name], MD, Stopped at 08/26/19 2252 .  ciprofloxacin (CIPRO) tablet 500 mg, 500 mg, Oral, BID, Little Ishikawa, MD .  docusate sodium (COLACE) capsule 100 mg, 100 mg, Oral, BID, [name], MD .  HYDROmorphone (DILAUDID) injection 0.5-1 mg, 0.5-1 mg, Intravenous, Q4H PRN, [name], MD, 1 mg at 08/26/19 2142 .  magnesium citrate solution 1 Bottle, 1 Bottle, Oral, Once PRN, [name], MD .  menthol-cetylpyridinium (CEPACOL) lozenge 3 mg, 1 lozenge, Oral, PRN, Little Ishikawa, MD .  methocarbamol (ROBAXIN) tablet 500 mg, 500 mg, Oral, Q6H PRN, 500 mg at 08/26/19 1840 **OR** methocarbamol (ROBAXIN) 500 mg in dextrose 5 % 50 mL IVPB, 500 mg, Intravenous, Q6H PRN, [name], MD .  metoCLOPramide (REGLAN) tablet 5-10 mg, 5-10 mg, Oral, Q8H PRN **OR** metoCLOPramide (REGLAN) injection 5-10 mg, 5-10 mg, Intravenous, Q8H PRN, [name], MD .  morphine 2 MG/ML injection 1-2 mg, 1-2 mg, Intravenous, Q3H PRN, [name], MD, 2 mg at 08/24/19 2132 .  ondansetron (ZOFRAN) tablet 4 mg, 4 mg, Oral, Q6H PRN **OR** ondansetron (ZOFRAN) injection 4 mg, 4 mg, Intravenous, Q6H PRN, [name], MD .  oxyCODONE (Oxy  IR/ROXICODONE) immediate release tablet 10-15 mg, 10-15 mg, Oral, Q4H PRN, [name], MD, 10 mg at 08/27/19 0541 .  oxyCODONE (Oxy IR/ROXICODONE) immediate release tablet 5-10 mg, 5-10 mg, Oral, Q4H PRN, [name], MD, 10 mg at 08/25/19 2017 .  polyethylene glycol (MIRALAX / GLYCOLAX) packet 17 g, 17 g, Oral, Daily PRN, [name], MD  Patients Current Diet:     Diet Order                  Diet Carb Modified Fluid consistency: Thin; Room service appropriate? Yes  Diet effective now  Precautions / Restrictions Precautions Precautions: Fall, Other (comment) Precaution Comments: BLE wound vacs Restrictions Weight Bearing Restrictions: No RLE Weight Bearing: Weight bearing as tolerated LLE Weight Bearing: Weight bearing as tolerated   Has the patient had 2 or more falls or a fall with injury in the past year? No  Prior Activity Level Limited Community (1-2x/wk): does not drive. Mod I pta  Prior Functional Level Self Care: Did the patient need help bathing, dressing, using the toilet or eating? Needed some help  Indoor Mobility: Did the patient need assistance with walking from room to room (with or without device)? Independent  Stairs: Did the patient need assistance with internal or external stairs (with or without device)? Needed some help  Functional Cognition: Did the patient need help planning regular tasks such as shopping or remembering to take medications? Needed some help  Home Assistive Devices / [place] Devices/Equipment: Environmental consultant (specify type), Other (Comment) Home Equipment: Walker - 2 wheels, Cane - single point, Bedside commode  Prior Device Use: Indicate devices/aids used by the patient prior to current illness, exacerbation or injury? Walker  Current Functional Level Cognition  Overall Cognitive Status: Within Functional Limits for tasks assessed Orientation Level: Oriented X4 General Comments:  very motivated    Extremity Assessment (includes Sensation/Coordination)  Upper Extremity Assessment: Overall WFL for tasks assessed  Lower Extremity Assessment: RLE deficits/detail, LLE deficits/detail RLE Deficits / Details: s/p lower leg wound debridement; hip and knee functionally at least 3/5 RLE Coordination: decreased gross motor LLE Deficits / Details: s/p lower leg wound debridement; hip and knee functionally at least 3/5 LLE Coordination: decreased gross motor    ADLs  Overall ADL's : Needs assistance/impaired Eating/Feeding: Independent Grooming: Wash/dry face, Wash/dry hands, Oral care, Modified independent, Sitting Grooming Details (indicate cue type and reason): requries sitting Upper Body Bathing: Set up, Sitting Lower Body Bathing: Maximal assistance Toilet Transfer: Minimal assistance, RW, Stand-pivot General ADL Comments: pt agreeable to OOB for luncha nd a bath with tech     Mobility  Overal bed mobility: Needs Assistance Bed Mobility: Supine to Sit Supine to sit: Supervision, HOB elevated General bed mobility comments: heavy reliance on HOB elevation and bed rails, increased time and effort    Transfers  Overall transfer level: Needs assistance Equipment used: Rolling walker (2 wheeled) Transfers: Sit to/from Stand Sit to Stand: Min assist Stand pivot transfers: Min assist General transfer comment: increased time to power up and stabilize initial standing balance. Assist to manage lines and stabilize balance    Ambulation / Gait / Stairs / Wheelchair Mobility  Ambulation/Gait Ambulation/Gait assistance: Min guard Gait Distance (Feet): 25 Feet Assistive device: Rolling walker (2 wheeled) Gait Pattern/deviations: Step-through pattern, Wide base of support, Antalgic, Shuffle General Gait Details: slow, shuffle gait with wide BOS. Cues for posture and RW management. 2/4 DOE Gait velocity: Decreased Gait velocity interpretation: <1.8 ft/sec, indicate  of risk for recurrent falls    Posture / Balance Balance Overall balance assessment: Needs assistance Sitting-balance support: No upper extremity supported, Feet supported Sitting balance-Leahy Scale: Fair Standing balance support: During functional activity, Bilateral upper extremity supported Standing balance-Leahy Scale: Poor Standing balance comment: relies on BIL UE    Special needs/care consideration BiPAP/CPAP n/a CPM  N/a Continuous Drip IV  RUE IV at [facility]; that arm slightly edematous today vs LUE Dialysis  N/a Life Vest  N/a Oxygen  N/a Special Bed  N/a Trach Size  N/a Wound Vac BLE VACS placed in OR 9/2 by Dr. Sharol Given  Skin  BLE surgical dressings with VACS and ace wraps Bowel mgmt:  Incontinent LBM 8/31 Bladder mgmt: external catheter Diabetic mgmt: Hgb A1c 5.8 Behavioral consideration  N/a Chemo/radiation  N/a Visitor designee is her daughter, Lia Foyer   Previous Home Environment  Living Arrangements: (boarding house. Her sister lives next door in same house)  Lives With: (sister lives in apartment next door in same home) Available Help at Discharge: Family, Available 24 hours/day Type of Home: House(boarding house) Home Layout: Two level Alternate Level Stairs-Rails: Left Alternate Level Stairs-Number of Steps: 8 Home Access: Stairs to enter Entrance Stairs-Rails: Right, Left Entrance Stairs-Number of Steps: 2 Bathroom Shower/Tub: Tub/shower unit, Architectural technologist: Programmer, systems: Yes Home Care Services: Yes Type of Home Care Services: Home PT, Home OT, Wilder (if known): [facility] at Home Additional Comments: sister lives in same boarding house in next door apartment  Discharge [place] for Discharge Living Setting: Alone Type of Home at Discharge: (boarding house with [place] apartments) Discharge Home Layout: Two level Alternate Level Stairs-Rails: Left Alternate Level Stairs-Number of Steps: 8  Discharge Home Access: Stairs to enter Entrance Stairs-Rails: Right, Left, Can reach both Entrance Stairs-Number of Steps: 2 Discharge Bathroom Shower/Tub: Tub/shower unit, Curtain Discharge Bathroom Toilet: Standard Discharge Bathroom Accessibility: Yes How Accessible: Accessible via walker Does the patient have any problems obtaining your medications?: No  Social/Family/Support Systems Patient Roles: Parent Contact Information: sister, Vaughan Basta Anticipated Caregiver: sister and other family members Anticipated Caregiver's Contact Information: see above Ability/Limitations of Caregiver: sister lives in next apartment next to her in boarding house Caregiver Availability: 24/7 Discharge Plan Discussed with Primary Caregiver: Yes Is Caregiver In Agreement with Plan?: Yes Does Caregiver/Family have Issues with Lodging/Transportation while Pt is in Rehab?: No  Goals/Additional Needs Patient/Family Goal for Rehab: Mod I to supervision with PT and OT Expected length of stay: ELOS 5 to 7 days Equipment Needs: WOund VACs for 1 week Pt/Family Agrees to Admission and willing to participate: Yes Program Orientation Provided & Reviewed with Pt/Caregiver Including Roles  & Responsibilities: Yes  Decrease burden of Care through IP rehab admission: n/a  Possible need for SNF placement upon discharge:  Not anticipated  Patient Condition: I have reviewed medical records from [facility] , spoken with CM, and patient. I met with patient at the bedside for inpatient rehabilitation assessment.  Patient will benefit from ongoing PT and OT, can actively participate in 3 hours of therapy a day 5 days of the week, and can make measurable gains during the admission.  Patient will also benefit from the coordinated team approach during an Inpatient Acute Rehabilitation admission.  The patient will receive intensive therapy as well as Rehabilitation physician, nursing, social worker, and care  management interventions.  Due to bladder management, bowel management, safety, skin/wound care, disease management, medication administration, pain management and patient education the patient requires 24 hour a day rehabilitation nursing.  The patient is currently min to min guard assist with mobility and basic ADLs.  Discharge setting and therapy post discharge at home with home health is anticipated.  Patient has agreed to participate in the Acute Inpatient Rehabilitation Program and will admit today.  Preadmission Screen Completed By:  Cleatrice Burke, 08/27/2019 10:27 AM ______________________________________________________________________   Discussed status with Dr. Naaman Plummer  on  08/27/2019 at  31 and received approval for admission today.  Admission Coordinator:  Cleatrice Burke, RN, time  0932 Date  08/27/2019   Assessment/Plan: Diagnosis: debility  1. Does the need for close, 24 hr/day Medical supervision in concert with the patient's rehab needs make it unreasonable for this patient to be served in a less intensive setting? Yes 2. Co-Morbidities requiring supervision/potential complications: wound care, ckd III, cdCHF, htn 3. Due to bladder management, bowel management, safety, skin/wound care, disease management, medication administration, pain management and patient education, does the patient require 24 hr/day rehab nursing? Yes 4. Does the patient require coordinated care of a physician, rehab nurse, PT (1-2 hrs/day, 5 days/week) and OT (1-2 hrs/day, 5 days/week) to address physical and functional deficits in the context of the above medical diagnosis(es)? Yes Addressing deficits in the following areas: balance, endurance, locomotion, strength, transferring, bowel/bladder control, bathing, dressing, feeding, grooming, toileting and psychosocial support 5. Can the patient actively participate in an intensive therapy program of at least 3 hrs of therapy 5 days a week? Yes  6. The potential for patient to make measurable gains while on inpatient rehab is excellent 7. Anticipated functional outcomes upon discharge from inpatients are: modified independent and supervision PT, modified independent and supervision OT, n/a SLP 8. Estimated rehab length of stay to reach the above functional goals is: 5-7 days 9. Anticipated D/C setting: Home 10. Anticipated post D/C treatments: [place] therapy 11. Overall Rehab/Functional Prognosis: excellent  MD Signature: Meredith Staggers, MD, [facility] 08/27/2019         Revision History

## 2019-08-27 NOTE — Progress Notes (Signed)
Inpatient Rehabilitation Admissions Coordinator  Inpatient rehab consult received. I met with patient at bedside for rehab assessment. She previously was at [facility] 07/2019 for 5 days and discharged home at Mod I level with her sister assisting. Pt prefers an inpt rehab admit. I will discuss with rehab MD/Swartz and follow up. Dr. Avon Gully in to see pt and in agreement.   Danne Baxter, RN, MSN Rehab Admissions Coordinator [phone] 08/27/2019 9:44 AM

## 2019-08-27 NOTE — Progress Notes (Signed)
Patient admitted to IP Rehab during shift. Denies pain during shift, patient wound vac in place and running. 62ml of drainage noted. Pt informed of the safety policy and visitor policy. Patient resting in bed at time. Adria Devon, LPN.

## 2019-08-27 NOTE — H&P (Signed)
Physical Medicine and Rehabilitation Admission H&P        Chief Complaint  Patient presents with  . Leg Injury      HPI: Kari [name] is a 58 year old female with history of HTN, sarcoidosis, NISCM /sp ICD,  HA, chronic stasis ulcers BLE with recent admission for skin grafts that developed dehiscence and necrosis due to inability to care for her wound. She was admitted on 08/19/19 due to increase in drainage with foul smell and pain. She was started on antibiotics as well as IVF for acute on chronic renal failure. She underwent I & D of wound on 08/31 and STSG placed  09/02 by Dr. Sharol Given. Prevena wound VAC to stay in place for at least a week and to continue on antibiotics X 4 weeks due to pseudomonas aeruginosa/proteus mirabilis positive wound cultures. Leucocytosis has improved from 16.1--->11 and ABLA being monitored.  Therapy initiated and CIR recommended due to functional decline.      Review of Systems  Constitutional: Negative for chills and fever.  HENT: Negative for hearing loss and tinnitus.   Eyes: Negative for blurred vision and double vision.  Respiratory: Negative for cough and shortness of breath.   Cardiovascular: Negative for chest pain and palpitations.  Gastrointestinal: Negative for constipation, heartburn and nausea.  Genitourinary: Negative for dysuria and urgency.  Musculoskeletal: Positive for joint pain. Negative for myalgias and neck pain.  Skin: Negative for rash.  Neurological: Negative for dizziness, speech change and headaches.  Psychiatric/Behavioral: The patient does not have insomnia.           Past Medical History:  Diagnosis Date  . Arthritis    . Asthma    . CHF (congestive heart failure) ([place]) 03/12/2014    a. EF 40-45% by echo in 06/2017 with normal cors by cath. ICD placed following VT arrest  . Depression    . Headache(784.0)    . History of blood transfusion    . Hyperlipidemia    . Hypertension    . Lung nodules    .  Myocardial infarction ([place]) 06/26/2014  . Renal disorder      followed by [facility]  . Sarcoidosis    . Ulcer      recurring, from chronic venous insufficiency          Past Surgical History:  Procedure Laterality Date  . cataract surgery Left 07-2013  . I&D EXTREMITY Bilateral 12/31/2018    Procedure: DEBRIDEMENT BILATERAL LEGS, APPLY VAC X 2;  Surgeon: [name], MD;  Location: [facility];  Service: Orthopedics;  Laterality: Bilateral;  . I&D EXTREMITY Bilateral 01/02/2019    Procedure: REPEAT DEBRIDEMENT BILATERAL LEGS, APPLY VAC X 2;  Surgeon: [name], MD;  Location: [facility];  Service: Orthopedics;  Laterality: Bilateral;  . I&D EXTREMITY Bilateral 07/15/2019    Procedure: IRRIGATION AND DEBRIDEMENT VENOUS STASIS INSUFFICIENCY ULCERATIONS BILATERAL LOWER EXTREMITIES;  Surgeon: [name], MD;  Location: [facility];  Service: Orthopedics;  Laterality: Bilateral;  . I&D EXTREMITY Bilateral 08/23/2019    Procedure: DEBRIDEMENT EXTREMITY;  Surgeon: [name], MD;  Location: [facility];  Service: Orthopedics;  Laterality: Bilateral;  . ICD IMPLANT N/A 07/05/2017    Procedure: ICD Implant;  Surgeon: Constance Haw, MD;  Location: [facility];  Service: Cardiovascular;  Laterality: N/A;  . LEFT HEART CATH AND CORONARY ANGIOGRAPHY N/A 07/03/2017    Procedure: Left Heart Cath and Coronary Angiography;  Surgeon:  Belva Crome, MD;  Location: [facility];  Service: Cardiovascular;  Laterality: N/A;  . SKIN SPLIT GRAFT Bilateral 01/07/2019    Procedure: SPLIT THICKNESS SKIN GRAFT BILATERAL LEGS, APPLY VAC;  Surgeon: [name], MD;  Location: [facility];  Service: Orthopedics;  Laterality: Bilateral;  . SKIN SPLIT GRAFT Bilateral 07/17/2019    Procedure: REPEAT IRRIGATION AND DEBRIDEMENT BILATERAL LOWER EXTREMITIES, SKIN GRAFT;  Surgeon: [name], MD;  Location: [facility];  Service: Orthopedics;  Laterality: Bilateral;          Family History  Problem Relation Age of  Onset  . Heart failure Mother    . Diabetes Mellitus II Mother    . Hypertension Mother    . Cancer Mother          unknown type  . Heart disease Father    . Stroke Father    . Diabetes Mellitus II Father       Social History:  Lives with sister. Was independent with cane in the home and walker when out of home. She  reports that she has never smoked. She has never used smokeless tobacco. She reports that she does not drink alcohol or use drugs.          Allergies  Allergen Reactions  . Tramadol Other (See Comments)      hallucination           Medications Prior to Admission  Medication Sig Dispense Refill  . acetaminophen (TYLENOL) 325 MG tablet Take 1-2 tablets (325-650 mg total) by mouth every 4 (four) hours as needed for mild pain.      [place] Kitchen albuterol (PROVENTIL HFA;VENTOLIN HFA) 108 (90 Base) MCG/ACT inhaler Inhale 2 puffs into the lungs every 6 (six) hours as needed for shortness of breath. 18 g 0  . allopurinol (ZYLOPRIM) 100 MG tablet Take 2 tablets (200 mg total) by mouth daily. (Patient taking differently: Take 100 mg by mouth 2 (two) times daily. ) 180 tablet 1  . aspirin EC 81 MG tablet Take 81 mg by mouth daily.      . carvedilol (COREG) 6.25 MG tablet Take 1 tablet (6.25 mg total) by mouth 2 (two) times daily. 60 tablet 0  . docusate sodium (COLACE) 100 MG capsule Take 1 capsule (100 mg total) by mouth 2 (two) times daily. 10 capsule 0  . furosemide (LASIX) 40 MG tablet Take 1.5 tablets (60 mg total) by mouth daily. Take 1-1/2 tablets to = 60 mg (Patient taking differently: Take 60 mg by mouth daily. ) 45 tablet 5  . oxyCODONE (OXY IR/ROXICODONE) 5 MG immediate release tablet Take 1 tablet (5 mg total) by mouth 2 (two) times daily as needed for severe pain. 60 tablet 0  . potassium chloride (K-DUR) 10 MEQ tablet Take 30 mEq by mouth daily.      . methocarbamol (ROBAXIN) 500 MG tablet Take 1 tablet (500 mg total) by mouth every 8 (eight) hours as needed for muscle spasms.  (Patient not taking: Reported on 08/19/2019) 90 tablet 0  . potassium chloride (KLOR-CON M15) 15 MEQ tablet Take 2 tablets (30 mEq total) by mouth daily. (Patient not taking: Reported on 08/19/2019) 30 tablet 0     Drug Regimen Review  Drug regimen was reviewed and remains appropriate with no significant issues identified   Home: Home Living Family/patient expects to be discharged to:: Private residence Living Arrangements: (boarding house. Her sister lives next door in same house) Available Help at Discharge:  Family, Available 24 hours/day Type of Home: House(boarding house) Home Access: Stairs to enter [employer] of Steps: 2 Entrance Stairs-Rails: Right, Left Home Layout: Two level Alternate Level Stairs-Number of Steps: 8 Alternate Level Stairs-Rails: Left Bathroom Shower/Tub: Tub/shower unit, Architectural technologist: Standard Bathroom Accessibility: Yes Home Equipment: Environmental consultant - 2 wheels, Cane - single point, Bedside commode Additional Comments: sister lives in same boarding house in next door apartment  Lives With: (sister lives in apartment next door in same home)   Functional History: Prior Function Level of Independence: Independent with assistive device(s) Gait / Transfers Assistance Needed: Household distances with [place] and RW ADL's / Homemaking Assistance Needed: has family help to handle housework and cooking Comments: does crossword puzzles and spends time with her grandson   Functional Status:  Mobility: Bed Mobility Overal bed mobility: Needs Assistance Bed Mobility: Supine to Sit Supine to sit: Supervision, HOB elevated General bed mobility comments: heavy reliance on HOB elevation and bed rails, increased time and effort Transfers Overall transfer level: Needs assistance Equipment used: Rolling walker (2 wheeled) Transfers: Sit to/from Stand Sit to Stand: Min assist Stand pivot transfers: Min assist General transfer comment: increased time to  power up and stabilize initial standing balance. Assist to manage lines and stabilize balance Ambulation/Gait Ambulation/Gait assistance: Min guard Gait Distance (Feet): 25 Feet Assistive device: Rolling walker (2 wheeled) Gait Pattern/deviations: Step-through pattern, Wide base of support, Antalgic, Shuffle General Gait Details: slow, shuffle gait with wide BOS. Cues for posture and RW management. 2/4 DOE Gait velocity: Decreased Gait velocity interpretation: <1.8 ft/sec, indicate of risk for recurrent falls     ADL: ADL Overall ADL's : Needs assistance/impaired Eating/Feeding: Independent Grooming: Wash/dry face, Wash/dry hands, Oral care, Modified independent, Sitting Grooming Details (indicate cue type and reason): requries sitting Upper Body Bathing: Set up, Sitting Lower Body Bathing: Maximal assistance Toilet Transfer: Minimal assistance, RW, Stand-pivot General ADL Comments: pt agreeable to OOB for luncha nd a bath with tech    Cognition: Cognition Overall Cognitive Status: Within Functional Limits for tasks assessed Orientation Level: Oriented X4 Cognition Arousal/Alertness: Awake/alert Behavior During Therapy: WFL for tasks assessed/performed Overall Cognitive Status: Within Functional Limits for tasks assessed General Comments: very motivated     Blood pressure 108/73, pulse 75, temperature 98.2 F (36.8 C), temperature source Oral, resp. rate 18, height 5' 7.01" (1.702 m), weight 125.8 kg, last menstrual period 11/21/2011, SpO2 100 %. Physical Exam  Nursing note and vitals reviewed. Constitutional: She is oriented to person, place, and time. She appears well-developed and well-nourished.  obese  HENT:  Head: Normocephalic and atraumatic.  Eyes: Pupils are equal, round, and reactive to light. EOM are normal.  Neck: Normal range of motion. No thyromegaly present.  Cardiovascular: Normal rate and regular rhythm. Exam reveals no friction rub.  No murmur heard.  Respiratory: Effort normal. No respiratory distress. She has no wheezes.  GI: Soft. She exhibits no distension. There is no abdominal tenderness.  Musculoskeletal: Normal range of motion.     Comments: BLE with dry unna boots covering wound VAC dressing. 1+edema RUE and 2+ edema right hand secondary to infiltrated IV?[place] Kitchen  Neurological: She is alert and oriented to person, place, and time.  UE motor 5/5. LE: 3/5 HF, KE and 4/5 ADF/PF, limited by unna wraps and vacs. Senses pain and light touch bilateral LE's.   Skin: Skin is warm and dry.  Psychiatric: She has a normal mood and affect. Her behavior is normal. Judgment and thought content normal.  Lab Results Last 48 Hours        Results for orders placed or performed during the hospital encounter of 08/19/19 (from the past 48 hour(s))  Basic metabolic panel     Status: Abnormal    Collection Time: 08/26/19  5:05 AM  Result Value Ref Range    Sodium 137 135 - 145 mmol/L    Potassium 4.1 3.5 - 5.1 mmol/L      Comment: DELTA CHECK NOTED    Chloride 106 98 - 111 mmol/L    CO2 23 22 - 32 mmol/L    Glucose, Bld 106 (H) 70 - 99 mg/dL    BUN 10 6 - 20 mg/dL    Creatinine, Ser 0.95 0.44 - 1.00 mg/dL    Calcium 8.6 (L) 8.9 - 10.3 mg/dL    GFR calc non Af Amer >60 >60 mL/min    GFR calc Af Amer >60 >60 mL/min    Anion gap 8 5 - 15      Comment: Performed at [facility], [place] [address], [place], [place] [address]  CBC     Status: Abnormal    Collection Time: 08/26/19  5:05 AM  Result Value Ref Range    WBC 8.4 4.0 - 10.5 K/uL    RBC 2.96 (L) 3.87 - 5.11 MIL/uL    Hemoglobin 8.4 (L) 12.0 - 15.0 g/dL    HCT 27.6 (L) 36.0 - 46.0 %    MCV 93.2 80.0 - 100.0 fL    MCH 28.4 26.0 - 34.0 pg    MCHC 30.4 30.0 - 36.0 g/dL    RDW 15.4 11.5 - 15.5 %    Platelets 280 150 - 400 K/uL    nRBC 0.0 0.0 - 0.2 %      Comment: Performed at [facility], [place] [address], [place], [place] [address]  Vancomycin, trough     Status: None     Collection Time: 08/26/19  9:52 AM  Result Value Ref Range    Vancomycin Tr 15 15 - 20 ug/mL      Comment: Performed at [facility] [address] [place], [place] [address]  Basic metabolic panel     Status: Abnormal    Collection Time: 08/27/19  3:14 AM  Result Value Ref Range    Sodium 137 135 - 145 mmol/L    Potassium 3.7 3.5 - 5.1 mmol/L    Chloride 105 98 - 111 mmol/L    CO2 23 22 - 32 mmol/L    Glucose, Bld 156 (H) 70 - 99 mg/dL    BUN 12 6 - 20 mg/dL    Creatinine, Ser 1.20 (H) 0.44 - 1.00 mg/dL    Calcium 8.6 (L) 8.9 - 10.3 mg/dL    GFR calc non Af Amer 50 (L) >60 mL/min    GFR calc Af Amer 58 (L) >60 mL/min    Anion gap 9 5 - 15      Comment: Performed at [facility] [address], [place], [place] [address]  CBC     Status: Abnormal    Collection Time: 08/27/19  3:14 AM  Result Value Ref Range    WBC 11.7 (H) 4.0 - 10.5 K/uL    RBC 2.82 (L) 3.87 - 5.11 MIL/uL    Hemoglobin 8.0 (L) 12.0 - 15.0 g/dL    HCT 26.4 (L) 36.0 - 46.0 %    MCV 93.6 80.0 - 100.0 fL  MCH 28.4 26.0 - 34.0 pg    MCHC 30.3 30.0 - 36.0 g/dL    RDW 15.6 (H) 11.5 - 15.5 %    Platelets 280 150 - 400 K/uL    nRBC 0.0 0.0 - 0.2 %      Comment: Performed at [facility], [place] [address], [place], [place] [address]     Imaging Results (Last 48 hours)  No results found.           Medical Problem List and Plan: 1.  Functional and mobility deficits secondary to multiple medical, bilateral LE wound infections s/p I&D with vac             -admit to inpatient rehab 2.  Antithrombotics: -DVT/anticoagulation:  Pharmaceutical: Lovenox             -antiplatelet therapy: N/A 3. Pain Management: Controlled with Oxycodone prn.  4. Mood: LCSW to follow for evaluation and support.              -antipsychotic agents: N/A 5. Neuropsych: This patient is capable of making decisions on her  own behalf. 6. Skin/Wound Care: Continue wound VAC for at least a week then transition to [place]  at d/c?. 7. Fluids/Electrolytes/Nutrition: Monitor I/O. Check lytes in am.  8. NICM s/p ICD: Continue Coreg bid.  9. Acute on chronic anemia: Add iron supplement.  10. Wound infection: On Cipro for 28 more days.  11. Leucocytosis: Monitor for resolution. Monitor for fevers and other signs of infection.  12. Acute on chronic renal failure: Baseline SCR 1.3. May need to resume  13. Prediabetes: Hgb A1c- 5.8--CM diet.          Bary Leriche, PA-C 08/27/2019  I have personally performed a face to face diagnostic evaluation of this patient and formulated the key components of the plan.  Additionally, I have personally reviewed laboratory data, imaging studies, as well as relevant notes and concur with the physician assistant's documentation above.  Meredith Staggers, MD, [facility]

## 2019-08-27 NOTE — Progress Notes (Signed)
Physical Therapy Treatment Patient Details Name: MIALANI REICKS MRN: [id number] DOB: 07/11/1961 Today's Date: 08/27/2019    History of Present Illness Pt is a 58 y.o. female admitted 08/19/19 with worsening BLE wounds and drainage. S/p BLE debridement 8/30. Of note, pt recently admitted 06/2019 with same condition and d/c to CIR. Other PMH includes CHF, HTN, MI, asthma, depression, OA. Pt underwent BLE skin grafting and wound vac placement 08/26/19.    PT Comments    Pt received in bed, agreeable to participation in therapy. Reporting 7/10 BLE pain. She required min assist transfers and min guard assist ambulation 25 feet with RW. Increased time required to complete all mobility skills. Pt limited by pain, weakness and DOE. Pt in recliner at end of session. She is very motivated to participate in therapy and return home. She has good family support.    Follow Up Recommendations  CIR;Supervision for mobility/OOB     Equipment Recommendations  Other (comment)(defer)    Recommendations for Other Services       Precautions / Restrictions Precautions Precautions: Fall;Other (comment) Precaution Comments: BLE wound vacs Restrictions RLE Weight Bearing: Weight bearing as tolerated LLE Weight Bearing: Weight bearing as tolerated    Mobility  Bed Mobility Overal bed mobility: Needs Assistance Bed Mobility: Supine to Sit     Supine to sit: Supervision;HOB elevated     General bed mobility comments: heavy reliance on HOB elevation and bed rails, increased time and effort  Transfers Overall transfer level: Needs assistance Equipment used: Rolling walker (2 wheeled) Transfers: Sit to/from Stand Sit to Stand: Min assist Stand pivot transfers: Min assist       General transfer comment: increased time to power up and stabilize initial standing balance. Assist to manage lines and stabilize balance  Ambulation/Gait Ambulation/Gait assistance: Min guard Gait Distance (Feet): 25  Feet Assistive device: Rolling walker (2 wheeled) Gait Pattern/deviations: Step-through pattern;Wide base of support;Antalgic;Shuffle Gait velocity: Decreased Gait velocity interpretation: <1.8 ft/sec, indicate of risk for recurrent falls General Gait Details: slow, shuffle gait with wide BOS. Cues for posture and RW management. 2/4 DOE   Marine scientist Rankin (Stroke Patients Only)       Balance Overall balance assessment: Needs assistance Sitting-balance support: No upper extremity supported;Feet supported Sitting balance-Leahy Scale: Fair     Standing balance support: During functional activity;Bilateral upper extremity supported Standing balance-Leahy Scale: Poor Standing balance comment: relies on BIL UE                            Cognition Arousal/Alertness: Awake/alert Behavior During Therapy: WFL for tasks assessed/performed Overall Cognitive Status: Within Functional Limits for tasks assessed                                 General Comments: very motivated      Exercises      General Comments        Pertinent Vitals/Pain Pain Assessment: 0-10 Pain Score: 7  Pain Location: BLEs Pain Descriptors / Indicators: Sore;Grimacing;Guarding Pain Intervention(s): Monitored during session;Repositioned    Home Living                      Prior Function            PT Goals (current goals can  now be found in the care plan section) Acute Rehab PT Goals Patient Stated Goal: Home for grandson's birthday PT Goal Formulation: With patient Time For Goal Achievement: 09/07/19 Potential to Achieve Goals: Good Progress towards PT goals: Progressing toward goals    Frequency    Min 3X/week      PT Plan Current plan remains appropriate    Co-evaluation              AM-PAC PT "6 Clicks" Mobility   Outcome Measure  Help needed turning from your back to your side while in a  flat bed without using bedrails?: A Little Help needed moving from lying on your back to sitting on the side of a flat bed without using bedrails?: A Little Help needed moving to and from a bed to a chair (including a wheelchair)?: A Little Help needed standing up from a chair using your arms (e.g., wheelchair or bedside chair)?: A Little Help needed to walk in hospital room?: A Little Help needed climbing 3-5 steps with a railing? : A Lot 6 Click Score: 17    End of Session Equipment Utilized During Treatment: Gait belt Activity Tolerance: Patient tolerated treatment well Patient left: in chair;with chair alarm set;with call bell/phone within reach Nurse Communication: Mobility status PT Visit Diagnosis: Other abnormalities of gait and mobility (R26.89);Pain     Time: 3151-7616 PT Time Calculation (min) (ACUTE ONLY): 24 min  Charges:  $Gait Training: 23-37 mins                     Lorrin Goodell, [place]  Office # [phone] Pager [phone]    Lorriane Shire 08/27/2019, 9:12 AM

## 2019-08-27 NOTE — PMR Pre-admission (Signed)
PMR Admission Coordinator Pre-Admission Assessment  Patient: Kari [name] is an 58 y.o., female MRN: [id number] DOB: [birth year] Height: 5' 7.01" (170.2 cm) Weight: 125.8 kg  Insurance Information HMO:     PPO:      PCP:      IPA:      80/20:      OTHER:  PRIMARY: Medicare a and b      Policy#: [id number]      Subscriber: pt Benefits:  Phone #: passport one online     Name: 08/27/2019 Eff. Date: 10/25/2015     Deduct: $1408      Out of Pocket Max: none      Life Max: none CIR: 100%      SNF: 20 full days Outpatient: 80%     Co-Pay: 20% Home Health: 100%      Co-Pay: none DME: 80%     Co-Pay: 20% Providers: pt choice  SECONDARY: Medicaid of [place]      Policy#: [id number]      Subscriber: pt Benefits:  Phone #: passport one      Name: 9/3 Eff. Date: active MADQN      Medicaid Application Date:       Case Manager:  Disability Application Date:       Case Worker:   The "Data Collection Information Summary" for patients in Inpatient Rehabilitation Facilities with attached "Privacy Act [place] Records" was provided and verbally reviewed with: Patient  Emergency Contact Information Contact Information    Name Relation Home Work Mobile   Yvonna, Brun Sister [phone]     Hiedi, [place] Daughter   [phone]      Current Medical History  Patient Admitting Diagnosis: debility  History of Present Illness: 58 year old female with medical history significant for chronic diastolic heart failure, asthma, hypertension,. Stage III CKD. Chronic bilateral LE ulcers for 2 years who recently underwent I and D of BLE ulcers with skin grafts on 07/17/2019 by Dr. Sharol Given. Presented on 08/19/2019 from home with worsening LE wounds with increased drainage and foul smell and worsening pain. On arrival with AKI with creatinine 3.04, WBC 16.1. Blood cultures drawn and Dr. Sharol Given consulted.   Patient placed on Vancomycin and Zosyn with plans to follow up on cultures. Dr. Sharol Given performed  debridement with wound VAC placement on 08/22/3029 and on 08/26/2019 with skin grafts and applied BLE wound VACS. Pain management ongoing and well controlled.   History of nonischemic cardiomyopathy status post ICD. Lasix held given AKI. Hypertension well controlled with Coreg  AKI stage III CKD. Felt secondary to ATN versus poor oral intake/dehydration. Baseline Creat felt to be 1.2 to 1.3. Today is 1.2. Hgb trending down with Hgb today 8.0. Lovenox for DVT prophylaxis.     Patient's medical record from [facility]  has been reviewed by the rehabilitation admission coordinator and physician.  Past Medical History  Past Medical History:  Diagnosis Date  . Arthritis   . Asthma   . CHF (congestive heart failure) ([place]) 03/12/2014   a. EF 40-45% by echo in 06/2017 with normal cors by cath. ICD placed following VT arrest  . Depression   . Headache(784.0)   . History of blood transfusion   . Hyperlipidemia   . Hypertension   . Lung nodules   . Myocardial infarction ([place]) 06/26/2014  . Renal disorder    followed by [facility]  . Sarcoidosis   . Ulcer    recurring, from chronic venous insufficiency  Family History   family history includes Cancer in her mother; Diabetes Mellitus II in her father and mother; Heart disease in her father; Heart failure in her mother; Hypertension in her mother; Stroke in her father.  Prior Rehab/Hospitalizations Has the patient had prior rehab or hospitalizations prior to admission? Yes  CIR 07/2019 for 5 days and d/c'd home at Mod I level  Has the patient had major surgery during 100 days prior to admission? Yes   Current Medications  Current Facility-Administered Medications:  .  0.9 %  sodium chloride infusion, , Intravenous, Continuous, [name], MD .  acetaminophen (TYLENOL) tablet 325-650 mg, 325-650 mg, Oral, Q6H PRN, [name], MD .  albuterol (PROVENTIL) (2.5 MG/3ML) 0.083% nebulizer solution 3 mL, 3 mL, Inhalation, Q6H  PRN, [name], MD .  allopurinol (ZYLOPRIM) tablet 200 mg, 200 mg, Oral, Daily, [name], MD, 200 mg at 08/27/19 1024 .  bisacodyl (DULCOLAX) suppository 10 mg, 10 mg, Rectal, Daily PRN, [name], MD .  carvedilol (COREG) tablet 6.25 mg, 6.25 mg, Oral, BID, [name], MD, Stopped at 08/26/19 2252 .  ciprofloxacin (CIPRO) tablet 500 mg, 500 mg, Oral, BID, Little Ishikawa, MD .  docusate sodium (COLACE) capsule 100 mg, 100 mg, Oral, BID, [name], MD .  HYDROmorphone (DILAUDID) injection 0.5-1 mg, 0.5-1 mg, Intravenous, Q4H PRN, [name], MD, 1 mg at 08/26/19 2142 .  magnesium citrate solution 1 Bottle, 1 Bottle, Oral, Once PRN, [name], MD .  menthol-cetylpyridinium (CEPACOL) lozenge 3 mg, 1 lozenge, Oral, PRN, Little Ishikawa, MD .  methocarbamol (ROBAXIN) tablet 500 mg, 500 mg, Oral, Q6H PRN, 500 mg at 08/26/19 1840 **OR** methocarbamol (ROBAXIN) 500 mg in dextrose 5 % 50 mL IVPB, 500 mg, Intravenous, Q6H PRN, [name], MD .  metoCLOPramide (REGLAN) tablet 5-10 mg, 5-10 mg, Oral, Q8H PRN **OR** metoCLOPramide (REGLAN) injection 5-10 mg, 5-10 mg, Intravenous, Q8H PRN, [name], MD .  morphine 2 MG/ML injection 1-2 mg, 1-2 mg, Intravenous, Q3H PRN, [name], MD, 2 mg at 08/24/19 2132 .  ondansetron (ZOFRAN) tablet 4 mg, 4 mg, Oral, Q6H PRN **OR** ondansetron (ZOFRAN) injection 4 mg, 4 mg, Intravenous, Q6H PRN, [name], MD .  oxyCODONE (Oxy IR/ROXICODONE) immediate release tablet 10-15 mg, 10-15 mg, Oral, Q4H PRN, [name], MD, 10 mg at 08/27/19 0541 .  oxyCODONE (Oxy IR/ROXICODONE) immediate release tablet 5-10 mg, 5-10 mg, Oral, Q4H PRN, [name], MD, 10 mg at 08/25/19 2017 .  polyethylene glycol (MIRALAX / GLYCOLAX) packet 17 g, 17 g, Oral, Daily PRN, [name], MD  Patients Current Diet:  Diet Order            Diet Carb Modified Fluid consistency: Thin; Room service appropriate? Yes  Diet effective now               Precautions / Restrictions Precautions Precautions: Fall, Other (comment) Precaution Comments: BLE wound vacs Restrictions Weight Bearing Restrictions: No RLE Weight Bearing: Weight bearing as tolerated LLE Weight Bearing: Weight bearing as tolerated   Has the patient had 2 or more falls or a fall with injury in the past year? No  Prior Activity Level Limited Community (1-2x/wk): does not drive. Mod I pta  Prior Functional Level Self Care: Did the patient need help bathing, dressing, using the toilet or eating? Needed some help  Indoor Mobility: Did the patient need assistance with  walking from room to room (with or without device)? Independent  Stairs: Did the patient need assistance with internal or external stairs (with or without device)? Needed some help  Functional Cognition: Did the patient need help planning regular tasks such as shopping or remembering to take medications? Needed some help  Home Assistive Devices / [place] Devices/Equipment: Environmental consultant (specify type), Other (Comment) Home Equipment: Walker - 2 wheels, Cane - single point, Bedside commode  Prior Device Use: Indicate devices/aids used by the patient prior to current illness, exacerbation or injury? Walker  Current Functional Level Cognition  Overall Cognitive Status: Within Functional Limits for tasks assessed Orientation Level: Oriented X4 General Comments: very motivated    Extremity Assessment (includes Sensation/Coordination)  Upper Extremity Assessment: Overall WFL for tasks assessed  Lower Extremity Assessment: RLE deficits/detail, LLE deficits/detail RLE Deficits / Details: s/p lower leg wound debridement; hip and knee functionally at least 3/5 RLE Coordination: decreased gross motor LLE Deficits / Details: s/p lower leg wound debridement; hip and knee functionally at least 3/5 LLE Coordination: decreased gross motor    ADLs  Overall ADL's : Needs  assistance/impaired Eating/Feeding: Independent Grooming: Wash/dry face, Wash/dry hands, Oral care, Modified independent, Sitting Grooming Details (indicate cue type and reason): requries sitting Upper Body Bathing: Set up, Sitting Lower Body Bathing: Maximal assistance Toilet Transfer: Minimal assistance, RW, Stand-pivot General ADL Comments: pt agreeable to OOB for luncha nd a bath with tech     Mobility  Overal bed mobility: Needs Assistance Bed Mobility: Supine to Sit Supine to sit: Supervision, HOB elevated General bed mobility comments: heavy reliance on HOB elevation and bed rails, increased time and effort    Transfers  Overall transfer level: Needs assistance Equipment used: Rolling walker (2 wheeled) Transfers: Sit to/from Stand Sit to Stand: Min assist Stand pivot transfers: Min assist General transfer comment: increased time to power up and stabilize initial standing balance. Assist to manage lines and stabilize balance    Ambulation / Gait / Stairs / Wheelchair Mobility  Ambulation/Gait Ambulation/Gait assistance: Min guard Gait Distance (Feet): 25 Feet Assistive device: Rolling walker (2 wheeled) Gait Pattern/deviations: Step-through pattern, Wide base of support, Antalgic, Shuffle General Gait Details: slow, shuffle gait with wide BOS. Cues for posture and RW management. 2/4 DOE Gait velocity: Decreased Gait velocity interpretation: <1.8 ft/sec, indicate of risk for recurrent falls    Posture / Balance Balance Overall balance assessment: Needs assistance Sitting-balance support: No upper extremity supported, Feet supported Sitting balance-Leahy Scale: Fair Standing balance support: During functional activity, Bilateral upper extremity supported Standing balance-Leahy Scale: Poor Standing balance comment: relies on BIL UE    Special needs/care consideration BiPAP/CPAP n/a CPM  N/a Continuous Drip IV  RUE IV at [facility]; that arm slightly edematous today vs  LUE Dialysis  N/a Life Vest  N/a Oxygen  N/a Special Bed  N/a Trach Size  N/a Wound Vac BLE VACS placed in OR 9/2 by Dr. Sharol Given Skin  BLE surgical dressings with VACS and ace wraps Bowel mgmt:  Incontinent LBM 8/31 Bladder mgmt: external catheter Diabetic mgmt: Hgb A1c 5.8 Behavioral consideration  N/a Chemo/radiation  N/a Visitor designee is her daughter, Lia Foyer   Previous Home Environment  Living Arrangements: (boarding house. Her sister lives next door in same house)  Lives With: (sister lives in apartment next door in same home) Available Help at Discharge: Family, Available 24 hours/day Type of Home: House(boarding house) Home Layout: Two level Alternate Level Stairs-Rails: Left Alternate Level Stairs-Number of Steps: 8  Home Access: Stairs to enter Entrance Stairs-Rails: Right, Left Entrance Stairs-Number of Steps: 2 Bathroom Shower/Tub: Tub/shower unit, Industrial/product designer: Yes Home Care Services: Yes Type of Home Care Services: Home PT, Home OT, [place] (if known): [place] [facility] Home Additional Comments: sister lives in same boarding house in next door apartment  Discharge [place] for Discharge Living Setting: Alone Type of Home at Discharge: (boarding house with [place] apartments) Discharge Home Layout: Two level Alternate Level Stairs-Rails: Left Alternate Level Stairs-Number of Steps: 8 Discharge Home Access: Stairs to enter Entrance Stairs-Rails: Right, Left, Can reach both Entrance Stairs-Number of Steps: 2 Discharge Bathroom Shower/Tub: Tub/shower unit, Curtain Discharge Bathroom Toilet: Standard Discharge Bathroom Accessibility: Yes How Accessible: Accessible via walker Does the patient have any problems obtaining your medications?: No  Social/Family/Support Systems Patient Roles: Parent Contact Information: sister, Vaughan Basta Anticipated Caregiver: sister and other family  members Anticipated Caregiver's Contact Information: see above Ability/Limitations of Caregiver: sister lives in next apartment next to her in boarding house Caregiver Availability: 24/7 Discharge Plan Discussed with Primary Caregiver: Yes Is Caregiver In Agreement with Plan?: Yes Does Caregiver/Family have Issues with Lodging/Transportation while Pt is in Rehab?: No  Goals/Additional Needs Patient/Family Goal for Rehab: Mod I to supervision with PT and OT Expected length of stay: ELOS 5 to 7 days Equipment Needs: WOund VACs for 1 week Pt/Family Agrees to Admission and willing to participate: Yes Program Orientation Provided & Reviewed with Pt/Caregiver Including Roles  & Responsibilities: Yes  Decrease burden of Care through IP rehab admission: n/a  Possible need for SNF placement upon discharge:  Not anticipated  Patient Condition: I have reviewed medical records from [facility] , spoken with CM, and patient. I met with patient at the bedside for inpatient rehabilitation assessment.  Patient will benefit from ongoing PT and OT, can actively participate in 3 hours of therapy a day 5 days of the week, and can make measurable gains during the admission.  Patient will also benefit from the coordinated team approach during an Inpatient Acute Rehabilitation admission.  The patient will receive intensive therapy as well as Rehabilitation physician, nursing, social worker, and care management interventions.  Due to bladder management, bowel management, safety, skin/wound care, disease management, medication administration, pain management and patient education the patient requires 24 hour a day rehabilitation nursing.  The patient is currently min to min guard assist with mobility and basic ADLs.  Discharge setting and therapy post discharge at home with home health is anticipated.  Patient has agreed to participate in the Acute Inpatient Rehabilitation Program and will admit  today.  Preadmission Screen Completed By:  Cleatrice Burke, 08/27/2019 10:27 AM ______________________________________________________________________   Discussed status with Dr. Naaman Plummer  on  08/27/2019 at  7 and received approval for admission today.  Admission Coordinator:  Cleatrice Burke, RN, time  8502 Date  08/27/2019   Assessment/Plan: Diagnosis: debility  1. Does the need for close, 24 hr/day Medical supervision in concert with the patient's rehab needs make it unreasonable for this patient to be served in a less intensive setting? Yes 2. Co-Morbidities requiring supervision/potential complications: wound care, ckd III, cdCHF, htn 3. Due to bladder management, bowel management, safety, skin/wound care, disease management, medication administration, pain management and patient education, does the patient require 24 hr/day rehab nursing? Yes 4. Does the patient require coordinated care of a physician, rehab nurse, PT (1-2 hrs/day, 5 days/week) and OT (1-2 hrs/day, 5 days/week)  to address physical and functional deficits in the context of the above medical diagnosis(es)? Yes Addressing deficits in the following areas: balance, endurance, locomotion, strength, transferring, bowel/bladder control, bathing, dressing, feeding, grooming, toileting and psychosocial support 5. Can the patient actively participate in an intensive therapy program of at least 3 hrs of therapy 5 days a week? Yes 6. The potential for patient to make measurable gains while on inpatient rehab is excellent 7. Anticipated functional outcomes upon discharge from inpatients are: modified independent and supervision PT, modified independent and supervision OT, n/a SLP 8. Estimated rehab length of stay to reach the above functional goals is: 5-7 days 9. Anticipated D/C setting: Home 10. Anticipated post D/C treatments: [place] therapy 11. Overall Rehab/Functional Prognosis: excellent  MD Signature: Meredith Staggers,  MD, [facility] 08/27/2019

## 2019-08-27 NOTE — Progress Notes (Signed)
Patient ID: Kari [name], female   DOB: 1961-03-28, 58 y.o.   MRN: [id number] Patient without complaints this morning.  There is about 50 cc in the wound VAC canister on both legs.  There are 2 checks on the seal.  Plan to continue the wound vacs at discharge for 1 week with the portable Praveena wound VAC pumps for both legs.  Anticipate patient will need inpatient versus outpatient rehab.  Would continue oral antibiotics at discharge for 4 weeks for the Pseudomonas infection.

## 2019-08-27 NOTE — H&P (Signed)
Physical Medicine and Rehabilitation Admission H&P    Chief Complaint  Patient presents with  . Leg Injury    HPI: Kari [name] is a 58 year old female with history of HTN, sarcoidosis, NISCM /sp ICD,  HA, chronic stasis ulcers BLE with recent admission for skin grafts that developed dehiscence and necrosis due to inability to care for her wound. She was admitted on 08/19/19 due to increase in drainage with foul smell and pain. She was started on antibiotics as well as IVF for acute on chronic renal failure. She underwent I & D of wound on 08/31 and STSG placed  09/02 by Dr. Sharol Given. Prevena wound VAC to stay in place for at least a week and to continue on antibiotics X 4 weeks due to pseudomonas aeruginosa/proteus mirabilis positive wound cultures. Leucocytosis has improved from 16.1--->11 and ABLA being monitored.  Therapy initiated and CIR recommended due to functional decline.    Review of Systems  Constitutional: Negative for chills and fever.  HENT: Negative for hearing loss and tinnitus.   Eyes: Negative for blurred vision and double vision.  Respiratory: Negative for cough and shortness of breath.   Cardiovascular: Negative for chest pain and palpitations.  Gastrointestinal: Negative for constipation, heartburn and nausea.  Genitourinary: Negative for dysuria and urgency.  Musculoskeletal: Positive for joint pain. Negative for myalgias and neck pain.  Skin: Negative for rash.  Neurological: Negative for dizziness, speech change and headaches.  Psychiatric/Behavioral: The patient does not have insomnia.      Past Medical History:  Diagnosis Date  . Arthritis   . Asthma   . CHF (congestive heart failure) ([place]) 03/12/2014   a. EF 40-45% by echo in 06/2017 with normal cors by cath. ICD placed following VT arrest  . Depression   . Headache(784.0)   . History of blood transfusion   . Hyperlipidemia   . Hypertension   . Lung nodules   . Myocardial infarction ([place])  06/26/2014  . Renal disorder    followed by [facility]  . Sarcoidosis   . Ulcer    recurring, from chronic venous insufficiency    Past Surgical History:  Procedure Laterality Date  . cataract surgery Left 07-2013  . I&D EXTREMITY Bilateral 12/31/2018   Procedure: DEBRIDEMENT BILATERAL LEGS, APPLY VAC X 2;  Surgeon: [name], MD;  Location: [place];  Service: Orthopedics;  Laterality: Bilateral;  . I&D EXTREMITY Bilateral 01/02/2019   Procedure: REPEAT DEBRIDEMENT BILATERAL LEGS, APPLY VAC X 2;  Surgeon: [name], MD;  Location: [place];  Service: Orthopedics;  Laterality: Bilateral;  . I&D EXTREMITY Bilateral 07/15/2019   Procedure: IRRIGATION AND DEBRIDEMENT VENOUS STASIS INSUFFICIENCY ULCERATIONS BILATERAL LOWER EXTREMITIES;  Surgeon: [name], MD;  Location: [place];  Service: Orthopedics;  Laterality: Bilateral;  . I&D EXTREMITY Bilateral 08/23/2019   Procedure: DEBRIDEMENT EXTREMITY;  Surgeon: [name], MD;  Location: [place];  Service: Orthopedics;  Laterality: Bilateral;  . ICD IMPLANT N/A 07/05/2017   Procedure: ICD Implant;  Surgeon: Constance Haw, MD;  Location: [facility] [facility];  Service: Cardiovascular;  Laterality: N/A;  . LEFT HEART CATH AND CORONARY ANGIOGRAPHY N/A 07/03/2017   Procedure: Left Heart Cath and Coronary Angiography;  Surgeon: Belva Crome, MD;  Location: [place] [facility];  Service: Cardiovascular;  Laterality: N/A;  . SKIN SPLIT GRAFT Bilateral 01/07/2019   Procedure: SPLIT THICKNESS SKIN GRAFT BILATERAL LEGS, APPLY VAC;  Surgeon: [name], MD;  Location: [place];  Service: Orthopedics;  Laterality: Bilateral;  . SKIN SPLIT GRAFT Bilateral 07/17/2019   Procedure: REPEAT IRRIGATION AND DEBRIDEMENT BILATERAL LOWER EXTREMITIES, SKIN GRAFT;  Surgeon: [name], MD;  Location: [place];  Service: Orthopedics;  Laterality: Bilateral;    Family History  Problem Relation Age of Onset  . Heart failure Mother   . Diabetes Mellitus II  Mother   . Hypertension Mother   . Cancer Mother        unknown type  . Heart disease Father   . Stroke Father   . Diabetes Mellitus II Father     Social History:  Lives with sister. Was independent with cane in the home and walker when out of home. She  reports that she has never smoked. She has never used smokeless tobacco. She reports that she does not drink alcohol or use drugs.   Allergies  Allergen Reactions  . Tramadol Other (See Comments)    hallucination    Medications Prior to Admission  Medication Sig Dispense Refill  . acetaminophen (TYLENOL) 325 MG tablet Take 1-2 tablets (325-650 mg total) by mouth every 4 (four) hours as needed for mild pain.    Marland Kitchen albuterol (PROVENTIL HFA;VENTOLIN HFA) 108 (90 Base) MCG/ACT inhaler Inhale 2 puffs into the lungs every 6 (six) hours as needed for shortness of breath. 18 g 0  . allopurinol (ZYLOPRIM) 100 MG tablet Take 2 tablets (200 mg total) by mouth daily. (Patient taking differently: Take 100 mg by mouth 2 (two) times daily. ) 180 tablet 1  . aspirin EC 81 MG tablet Take 81 mg by mouth daily.    . carvedilol (COREG) 6.25 MG tablet Take 1 tablet (6.25 mg total) by mouth 2 (two) times daily. 60 tablet 0  . docusate sodium (COLACE) 100 MG capsule Take 1 capsule (100 mg total) by mouth 2 (two) times daily. 10 capsule 0  . furosemide (LASIX) 40 MG tablet Take 1.5 tablets (60 mg total) by mouth daily. Take 1-1/2 tablets to = 60 mg (Patient taking differently: Take 60 mg by mouth daily. ) 45 tablet 5  . oxyCODONE (OXY IR/ROXICODONE) 5 MG immediate release tablet Take 1 tablet (5 mg total) by mouth 2 (two) times daily as needed for severe pain. 60 tablet 0  . potassium chloride (K-DUR) 10 MEQ tablet Take 30 mEq by mouth daily.    . methocarbamol (ROBAXIN) 500 MG tablet Take 1 tablet (500 mg total) by mouth every 8 (eight) hours as needed for muscle spasms. (Patient not taking: Reported on 08/19/2019) 90 tablet 0  . potassium chloride (KLOR-CON  M15) 15 MEQ tablet Take 2 tablets (30 mEq total) by mouth daily. (Patient not taking: Reported on 08/19/2019) 30 tablet 0    Drug Regimen Review  Drug regimen was reviewed and remains appropriate with no significant issues identified  Home: Home Living Family/patient expects to be discharged to:: Private residence Living Arrangements: (boarding house. Her sister lives next door in same house) Available Help at Discharge: Family, Available 24 hours/day Type of Home: House(boarding house) Home Access: Stairs to enter [employer] of Steps: 2 Entrance Stairs-Rails: Right, Left Home Layout: Two level Alternate Level Stairs-Number of Steps: 8 Alternate Level Stairs-Rails: Left Bathroom Shower/Tub: Tub/shower unit, Curtain Bathroom Toilet: Standard Bathroom Accessibility: Yes Home Equipment: Environmental consultant - 2 wheels, Cane - single point, Bedside commode Additional Comments: sister lives in same boarding house in next door apartment  Lives With: (sister lives in apartment next door in  same home)   Functional History: Prior Function Level of Independence: Independent with assistive device(s) Gait / Transfers Assistance Needed: Household distances with [place] and RW ADL's / Homemaking Assistance Needed: has family help to handle housework and cooking Comments: does crossword puzzles and spends time with her grandson  Functional Status:  Mobility: Bed Mobility Overal bed mobility: Needs Assistance Bed Mobility: Supine to Sit Supine to sit: Supervision, HOB elevated General bed mobility comments: heavy reliance on HOB elevation and bed rails, increased time and effort Transfers Overall transfer level: Needs assistance Equipment used: Rolling walker (2 wheeled) Transfers: Sit to/from Stand Sit to Stand: Min assist Stand pivot transfers: Min assist General transfer comment: increased time to power up and stabilize initial standing balance. Assist to manage lines and stabilize balance  Ambulation/Gait Ambulation/Gait assistance: Min guard Gait Distance (Feet): 25 Feet Assistive device: Rolling walker (2 wheeled) Gait Pattern/deviations: Step-through pattern, Wide base of support, Antalgic, Shuffle General Gait Details: slow, shuffle gait with wide BOS. Cues for posture and RW management. 2/4 DOE Gait velocity: Decreased Gait velocity interpretation: <1.8 ft/sec, indicate of risk for recurrent falls    ADL: ADL Overall ADL's : Needs assistance/impaired Eating/Feeding: Independent Grooming: Wash/dry face, Wash/dry hands, Oral care, Modified independent, Sitting Grooming Details (indicate cue type and reason): requries sitting Upper Body Bathing: Set up, Sitting Lower Body Bathing: Maximal assistance Toilet Transfer: Minimal assistance, RW, Stand-pivot General ADL Comments: pt agreeable to OOB for luncha nd a bath with tech   Cognition: Cognition Overall Cognitive Status: Within Functional Limits for tasks assessed Orientation Level: Oriented X4 Cognition Arousal/Alertness: Awake/alert Behavior During Therapy: WFL for tasks assessed/performed Overall Cognitive Status: Within Functional Limits for tasks assessed General Comments: very motivated   Blood pressure 108/73, pulse 75, temperature 98.2 F (36.8 C), temperature source Oral, resp. rate 18, height 5' 7.01" (1.702 m), weight 125.8 kg, last menstrual period 11/21/2011, SpO2 100 %. Physical Exam  Nursing note and vitals reviewed. Constitutional: She is oriented to person, place, and time. She appears well-developed and well-nourished.  obese  HENT:  Head: Normocephalic and atraumatic.  Eyes: Pupils are equal, round, and reactive to light. EOM are normal.  Neck: Normal range of motion. No thyromegaly present.  Cardiovascular: Normal rate and regular rhythm. Exam reveals no friction rub.  No murmur heard. Respiratory: Effort normal. No respiratory distress. She has no wheezes.  GI: Soft. She exhibits no  distension. There is no abdominal tenderness.  Musculoskeletal: Normal range of motion.     Comments: BLE with dry unna boots covering wound VAC dressing. 1+edema RUE and 2+ edema right hand secondary to infiltrated IV?Marland Kitchen  Neurological: She is alert and oriented to person, place, and time.  UE motor 5/5. LE: 3/5 HF, KE and 4/5 ADF/PF, limited by unna wraps and vacs. Senses pain and light touch bilateral LE's.   Skin: Skin is warm and dry.  Psychiatric: She has a normal mood and affect. Her behavior is normal. Judgment and thought content normal.    Results for orders placed or performed during the hospital encounter of 08/19/19 (from the past 48 hour(s))  Basic metabolic panel     Status: Abnormal   Collection Time: 08/26/19  5:05 AM  Result Value Ref Range   Sodium 137 135 - 145 mmol/L   Potassium 4.1 3.5 - 5.1 mmol/L    Comment: DELTA CHECK NOTED   Chloride 106 98 - 111 mmol/L   CO2 23 22 - 32 mmol/L   Glucose, Bld 106 (H)  70 - 99 mg/dL   BUN 10 6 - 20 mg/dL   Creatinine, Ser 0.95 0.44 - 1.00 mg/dL   Calcium 8.6 (L) 8.9 - 10.3 mg/dL   GFR calc non Af Amer >60 >60 mL/min   GFR calc Af Amer >60 >60 mL/min   Anion gap 8 5 - 15    Comment: Performed at [facility] [address], [place], [place] [address]  CBC     Status: Abnormal   Collection Time: 08/26/19  5:05 AM  Result Value Ref Range   WBC 8.4 4.0 - 10.5 K/uL   RBC 2.96 (L) 3.87 - 5.11 MIL/uL   Hemoglobin 8.4 (L) 12.0 - 15.0 g/dL   HCT 27.6 (L) 36.0 - 46.0 %   MCV 93.2 80.0 - 100.0 fL   MCH 28.4 26.0 - 34.0 pg   MCHC 30.4 30.0 - 36.0 g/dL   RDW 15.4 11.5 - 15.5 %   Platelets 280 150 - 400 K/uL   nRBC 0.0 0.0 - 0.2 %    Comment: Performed at [facility], [place] [address] [place], [place] [address]  Vancomycin, trough     Status: None   Collection Time: 08/26/19  9:52 AM  Result Value Ref Range   Vancomycin Tr 15 15 - 20 ug/mL    Comment: Performed at [facility] [address], [place],  [place] [address]  Basic metabolic panel     Status: Abnormal   Collection Time: 08/27/19  3:14 AM  Result Value Ref Range   Sodium 137 135 - 145 mmol/L   Potassium 3.7 3.5 - 5.1 mmol/L   Chloride 105 98 - 111 mmol/L   CO2 23 22 - 32 mmol/L   Glucose, Bld 156 (H) 70 - 99 mg/dL   BUN 12 6 - 20 mg/dL   Creatinine, Ser 1.20 (H) 0.44 - 1.00 mg/dL   Calcium 8.6 (L) 8.9 - 10.3 mg/dL   GFR calc non Af Amer 50 (L) >60 mL/min   GFR calc Af Amer 58 (L) >60 mL/min   Anion gap 9 5 - 15    Comment: Performed at [facility] [address], [place], [place] [address]  CBC     Status: Abnormal   Collection Time: 08/27/19  3:14 AM  Result Value Ref Range   WBC 11.7 (H) 4.0 - 10.5 K/uL   RBC 2.82 (L) 3.87 - 5.11 MIL/uL   Hemoglobin 8.0 (L) 12.0 - 15.0 g/dL   HCT 26.4 (L) 36.0 - 46.0 %   MCV 93.6 80.0 - 100.0 fL   MCH 28.4 26.0 - 34.0 pg   MCHC 30.3 30.0 - 36.0 g/dL   RDW 15.6 (H) 11.5 - 15.5 %   Platelets 280 150 - 400 K/uL   nRBC 0.0 0.0 - 0.2 %    Comment: Performed at [facility] [address], [place], [place] [address]   No results found.     Medical Problem List and Plan: 1.  Functional and mobility deficits secondary to multiple medical, bilateral LE wound infections s/p I&D with vac  -admit to inpatient rehab 2.  Antithrombotics: -DVT/anticoagulation:  Pharmaceutical: Lovenox  -antiplatelet therapy: N/A 3. Pain Management: Controlled with Oxycodone prn.  4. Mood: LCSW to follow for evaluation and support.   -antipsychotic agents: N/A 5. Neuropsych: This patient is capable of making decisions on her  own behalf. 6. Skin/Wound Care: Continue wound VAC for at least a week  then transition to [place] at d/c?. 7. Fluids/Electrolytes/Nutrition: Monitor I/O. Check lytes in am.  8. NICM s/p ICD: Continue Coreg bid.  9. Acute on chronic anemia: Add iron supplement.  10. Wound infection: On Cipro for 28 more days.  11. Leucocytosis: Monitor for resolution. Monitor for fevers and  other signs of infection.  12. Acute on chronic renal failure: Baseline SCR 1.3. May need to resume  13. Prediabetes: Hgb A1c- 5.8--CM diet.       Bary Leriche, PA-C 08/27/2019

## 2019-08-28 ENCOUNTER — Inpatient Hospital Stay (HOSPITAL_COMMUNITY): Payer: Medicare Other | Admitting: Occupational Therapy

## 2019-08-28 ENCOUNTER — Inpatient Hospital Stay (HOSPITAL_COMMUNITY): Payer: Medicare Other | Admitting: Physical Therapy

## 2019-08-28 DIAGNOSIS — R5381 Other malaise: Principal | ICD-10-CM

## 2019-08-28 LAB — AEROBIC/ANAEROBIC CULTURE W GRAM STAIN (SURGICAL/DEEP WOUND)

## 2019-08-28 LAB — CBC WITH DIFFERENTIAL/PLATELET
Abs Immature Granulocytes: 0.05 10*3/uL (ref 0.00–0.07)
Basophils Absolute: 0 10*3/uL (ref 0.0–0.1)
Basophils Relative: 0 %
Eosinophils Absolute: 0.2 10*3/uL (ref 0.0–0.5)
Eosinophils Relative: 2 %
HCT: 24.9 % — ABNORMAL LOW (ref 36.0–46.0)
Hemoglobin: 7.4 g/dL — ABNORMAL LOW (ref 12.0–15.0)
Immature Granulocytes: 1 %
Lymphocytes Relative: 20 %
Lymphs Abs: 2.1 10*3/uL (ref 0.7–4.0)
MCH: 28.7 pg (ref 26.0–34.0)
MCHC: 29.7 g/dL — ABNORMAL LOW (ref 30.0–36.0)
MCV: 96.5 fL (ref 80.0–100.0)
Monocytes Absolute: 0.8 10*3/uL (ref 0.1–1.0)
Monocytes Relative: 7 %
Neutro Abs: 7.5 10*3/uL (ref 1.7–7.7)
Neutrophils Relative %: 70 %
Platelets: 256 10*3/uL (ref 150–400)
RBC: 2.58 MIL/uL — ABNORMAL LOW (ref 3.87–5.11)
RDW: 16.9 % — ABNORMAL HIGH (ref 11.5–15.5)
WBC: 10.8 10*3/uL — ABNORMAL HIGH (ref 4.0–10.5)
nRBC: 0 % (ref 0.0–0.2)

## 2019-08-28 LAB — COMPREHENSIVE METABOLIC PANEL
ALT: 11 U/L (ref 0–44)
AST: 18 U/L (ref 15–41)
Albumin: 1.9 g/dL — ABNORMAL LOW (ref 3.5–5.0)
Alkaline Phosphatase: 39 U/L (ref 38–126)
Anion gap: 7 (ref 5–15)
BUN: 14 mg/dL (ref 6–20)
CO2: 26 mmol/L (ref 22–32)
Calcium: 8.6 mg/dL — ABNORMAL LOW (ref 8.9–10.3)
Chloride: 106 mmol/L (ref 98–111)
Creatinine, Ser: 1.07 mg/dL — ABNORMAL HIGH (ref 0.44–1.00)
GFR calc Af Amer: >60 mL/min (ref >60)
GFR calc non Af Amer: 58 mL/min — ABNORMAL LOW (ref >60)
Glucose, Bld: 104 mg/dL — ABNORMAL HIGH (ref 70–99)
Potassium: 3.2 mmol/L — ABNORMAL LOW (ref 3.5–5.1)
Sodium: 139 mmol/L (ref 135–145)
Total Bilirubin: 0.3 mg/dL (ref 0.3–1.2)
Total Protein: 6 g/dL — ABNORMAL LOW (ref 6.5–8.1)

## 2019-08-28 MED ORDER — POTASSIUM CHLORIDE CRYS ER 20 MEQ PO TBCR
20.0000 meq | EXTENDED_RELEASE_TABLET | Freq: Every day | ORAL | Status: DC
  Administered 2019-08-28 – 2019-09-03 (×7): 20 meq via ORAL
  Filled 2019-08-28 (×7): qty 1

## 2019-08-28 NOTE — Care Management Note (Signed)
Inpatient Rehabilitation Center Individual Statement of Services  Patient Name:  Kari [name]  Date:  08/28/2019  Welcome to the [facility].  Our goal is to provide you with an individualized program based on your diagnosis and situation, designed to meet your specific needs.  With this comprehensive rehabilitation program, you will be expected to participate in at least 3 hours of rehabilitation therapies Monday-Friday, with modified therapy programming on the weekends.  Your rehabilitation program will include the following services:  Physical Therapy (PT), Occupational Therapy (OT), 24 hour per day rehabilitation nursing, Therapeutic Recreaction (TR), Neuropsychology, Case Management (Social Worker), Rehabilitation Medicine, Nutrition Services and Pharmacy Services  Weekly team conferences will be held on Wednesday to discuss your progress.  Your Social Worker will talk with you frequently to get your input and to update you on team discussions.  Team conferences with you and your family in attendance may also be held.  Expected length of stay: 5-7 days  Overall anticipated outcome: independent with device  Depending on your progress and recovery, your program may change. Your Social Worker will coordinate services and will keep you informed of any changes. Your Social Worker's name and contact numbers are listed  below.  The following services may also be recommended but are not provided by the [facility]:    [place] will be made to provide these services after discharge if needed.  Arrangements include referral to agencies that provide these services.  Your insurance has been verified to be:  Medicare & medicaid Your primary doctor is:  [name]  Pertinent information will be shared with your doctor and your insurance company.  Social Worker:  Ovidio Kin, [place] or ([phone]  Information discussed with and copy given to patient by: [name], 08/28/2019, 8:51 AM

## 2019-08-28 NOTE — Progress Notes (Signed)
[facility] PHYSICAL MEDICINE & REHABILITATION PROGRESS NOTE   Subjective/Complaints:  Working with OT, Min A ADL  ROS- neg CP, SOB, N/V/D  Objective:   No results found. Recent Labs    08/27/19 0314 08/28/19 0509  WBC 11.7* 10.8*  HGB 8.0* 7.4*  HCT 26.4* 24.9*  PLT 280 256   Recent Labs    08/27/19 0314 08/28/19 0509  NA 137 139  K 3.7 3.2*  CL 105 106  CO2 23 26  GLUCOSE 156* 104*  BUN 12 14  CREATININE 1.20* 1.07*  CALCIUM 8.6* 8.6*    Intake/Output Summary (Last 24 hours) at 08/28/2019 0657 Last data filed at 08/28/2019 0400 Gross per 24 hour  Intake 540 ml  Output -  Net 540 ml     Physical Exam: Vital Signs Blood pressure (!) 105/49, pulse 71, temperature 98.6 F (37 C), temperature source Oral, resp. rate 18, height 5\' 7"  (1.702 m), weight 130 kg, last menstrual period 11/21/2011, SpO2 98 %.  General: No acute distress Mood and affect are appropriate Heart: Regular rate and rhythm no rubs murmurs or extra sounds Lungs: Clear to auscultation, breathing unlabored, no rales or wheezes Abdomen: Positive bowel sounds, soft nontender to palpation, nondistended Extremities: No clubbing, cyanosis, or edema Skin: ACE wraps B legs with wound vacs Neurologic: Cranial nerves II through XII intact, motor strength is 5/5 in bilateral deltoid, bicep, tricep, grip,4- hip flexor, knee extensors, ankle dorsiflexor and plantar flexor Sensory exam normal sensation to light touch and proprioception in bilateral upper and lower extremities Cerebellar exam normal finger to nose to finger as well as heel to shin in bilateral upper and lower extremities Musculoskeletal: Full range of motion in all 4 extremities. No joint swelling    Assessment/Plan: 1. Functional deficits secondary to debility  which require 3+ hours per day of interdisciplinary therapy in a comprehensive inpatient rehab setting.  Physiatrist is providing close team supervision and 24 hour management of  active medical problems listed below.  Physiatrist and rehab team continue to assess barriers to discharge/monitor patient progress toward functional and medical goals  Care Tool:  Bathing              Bathing assist       Upper Body Dressing/Undressing Upper body dressing        Upper body assist      Lower Body Dressing/Undressing Lower body dressing            Lower body assist       Toileting Toileting    Toileting assist       Transfers Chair/bed transfer  Transfers assist           Locomotion Ambulation   Ambulation assist              Walk 10 feet activity   Assist           Walk 50 feet activity   Assist           Walk 150 feet activity   Assist           Walk 10 feet on uneven surface  activity   Assist           Wheelchair     Assist               Wheelchair 50 feet with 2 turns activity    Assist            Wheelchair 150 feet activity  Assist          Blood pressure (!) 105/49, pulse 71, temperature 98.6 F (37 C), temperature source Oral, resp. rate 18, height 5\' 7"  (1.702 m), weight 130 kg, last menstrual period 11/21/2011, SpO2 98 %.  Medical Problem List and Plan: 1.Functional and mobility deficitssecondary to multiple medical, bilateral LE wound infections s/p I&D with vac PT, OT CIR Evals       2  DVT prophyllaxis -DVT/anticoagulation:Pharmaceutical:Lovenox -antiplatelet therapy: N/A 3. Pain Management:Controlled withOxycodone prn. 4. Mood:LCSW to follow for evaluation and support. -antipsychotic agents: N/A 5. Neuropsych: This patientiscapable of making decisions on herown behalf. 6. Skin/Wound Care:Continue wound VAC for at least a weekthen transition to [place] at d/c?. 7. Fluids/Electrolytes/Nutrition:Monitor I/O. Check lytes in am.  8. NICM s/p ICD: Continue Coreg bid.  9. Acute on chronic  anemia: Add iron supplement. Dropped to 7.4 but not symptomatic hold off on transfusion  10. Wound infection: On Cipro for 28 more days.  11. Leucocytosis: Monitor for resolution. Monitor for fevers and other signs of infection.  12. Acute on chronic renal failure: Baseline SCR 1.3. May need to resume  13. Prediabetes: Hgb A1c- 5.8--CM diet.    LOS: 1 days A FACE TO [place] E Ahmet Schank 08/28/2019, 6:57 AM

## 2019-08-28 NOTE — Evaluation (Signed)
Physical Therapy Assessment and Plan  Patient Details  Name: Kari [name] MRN: [id number] Date of Birth: Oct 11, 1961  PT Diagnosis: Abnormality of gait, Difficulty walking, Muscle weakness and Pain in B LEs Rehab Potential: Excellent ELOS: ~7 days   Today's Date: 08/28/2019 PT Individual Time: 9563-8756 PT Individual Time Calculation (min): 78 min    Problem List:  Patient Active Problem List   Diagnosis Date Noted  . Debility 08/27/2019  . Multiple open wounds of lower leg   . Moderate protein-calorie malnutrition ([place])   . Non-healing ulcer ([place]) 08/19/2019  . Postoperative pain   . Labile blood pressure   . Nonischemic cardiomyopathy ([place])   . Physical debility 07/20/2019  . Idiopathic chronic venous hypertension of lower extremity with ulcer, bilateral ([place]) 07/15/2019  . H/O skin graft 05/13/2019  . CKD (chronic kidney disease), stage III ([place]) 01/13/2019  . Infected wound 12/26/2018  . Idiopathic chronic venous hypertension of both lower extremities with ulcer and inflammation ([place])   . History of fall 05/10/2018  . Hyperkalemia 02/21/2018  . Hypotension 02/21/2018  . Sepsis ([place]) 02/21/2018  . Encounter for central line placement   . Mitral regurgitation 10/30/2017  . Acute kidney injury ([place]) 07/21/2017  . Generalized weakness 07/20/2017  . Shock circulatory ([place])   . Acute respiratory failure with hypoxia ([place])   . Pulmonary edema   . Cardiac arrest ([place]) 06/26/2017  . Ventricular fibrillation ([place])   . Acute on chronic diastolic heart failure ([place])   . Abnormal ECG   . Cellulitis 04/02/2017  . AKI (acute kidney injury) ([place]) 12/17/2016  . Cellulitis and abscess of right lower extremity 12/17/2016  . Syncope and collapse 12/17/2016  . Peripheral edema 12/17/2016  . Hypokalemia 12/17/2016  . Anemia of chronic disease 12/17/2016  . Acute diastolic CHF (congestive heart failure) ([place]) 03/14/2014  . CHF (congestive heart failure) ([place]) 03/12/2014  .  Sarcoidosis ([place]) 03/12/2014  . Severe sepsis ([place]) 10/17/2013  . ARF (acute renal failure) ([place]) 10/08/2013  . Cellulitis of multiple sites of lower extremity 10/08/2013  . Morbid obesity ([place]) 10/08/2013  . Volume depletion 10/08/2013  . Venous (peripheral) insufficiency 10/28/2012  . Mediastinal lymphadenopathy 10/16/2012  . Pulmonary nodules 10/16/2012  . 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 Chest pain 09/02/2012  . Asthma   . Hypertension   . Depression   . Venous stasis ulcers ([place]) 07/29/2012  . Varicose veins of lower extremities with ulcer ([place]) 07/08/2012  . Venous ulcer of leg ([place]) 07/08/2012    Past Medical History:  Past Medical History:  Diagnosis Date  . Arthritis   . Asthma   . CHF (congestive heart failure) ([place]) 03/12/2014   a. EF 40-45% by echo in 06/2017 with normal cors by cath. ICD placed following VT arrest  . Depression   . Headache(784.0)   . History of blood transfusion   . Hyperlipidemia   . Hypertension   . Lung nodules   . Myocardial infarction ([place]) 06/26/2014  . Renal disorder    followed by [facility]  . Sarcoidosis   . Ulcer    recurring, from chronic venous insufficiency   Past Surgical History:  Past Surgical History:  Procedure Laterality Date  . cataract surgery Left 07-2013  . I&D EXTREMITY Bilateral 12/31/2018   Procedure: DEBRIDEMENT BILATERAL LEGS, APPLY VAC X 2;  Surgeon: [name], MD;  Location: [facility];  Service: Orthopedics;  Laterality: Bilateral;  . I&D EXTREMITY Bilateral 01/02/2019  Procedure: REPEAT DEBRIDEMENT BILATERAL LEGS, APPLY VAC X 2;  Surgeon: [name], MD;  Location: [facility];  Service: Orthopedics;  Laterality: Bilateral;  . I&D EXTREMITY Bilateral 07/15/2019   Procedure: IRRIGATION AND DEBRIDEMENT VENOUS STASIS INSUFFICIENCY ULCERATIONS BILATERAL LOWER EXTREMITIES;  Surgeon: [name], MD;  Location: [facility];  Service: Orthopedics;   Laterality: Bilateral;  . I&D EXTREMITY Bilateral 08/23/2019   Procedure: DEBRIDEMENT EXTREMITY;  Surgeon: [name], MD;  Location: [facility];  Service: Orthopedics;  Laterality: Bilateral;  . ICD IMPLANT N/A 07/05/2017   Procedure: ICD Implant;  Surgeon: Constance Haw, MD;  Location: [facility];  Service: Cardiovascular;  Laterality: N/A;  . LEFT HEART CATH AND CORONARY ANGIOGRAPHY N/A 07/03/2017   Procedure: Left Heart Cath and Coronary Angiography;  Surgeon: Belva Crome, MD;  Location: [facility];  Service: Cardiovascular;  Laterality: N/A;  . SKIN SPLIT GRAFT Bilateral 01/07/2019   Procedure: SPLIT THICKNESS SKIN GRAFT BILATERAL LEGS, APPLY VAC;  Surgeon: [name], MD;  Location: [facility];  Service: Orthopedics;  Laterality: Bilateral;  . SKIN SPLIT GRAFT Bilateral 07/17/2019   Procedure: REPEAT IRRIGATION AND DEBRIDEMENT BILATERAL LOWER EXTREMITIES, SKIN GRAFT;  Surgeon: [name], MD;  Location: [facility];  Service: Orthopedics;  Laterality: Bilateral;  . SKIN SPLIT GRAFT Bilateral 08/26/2019   Procedure: SKIN GRAFT SPLIT THICKNESS BILATERAL LEGS, APPLY WOUND VAC;  Surgeon: [name], MD;  Location: [facility];  Service: Orthopedics;  Laterality: Bilateral;    Assessment & Plan Clinical Impression: Patient is a 58 y.o. year old female with history of HTN, sarcoidosis, NISCM /sp ICD, HA, chronic stasis ulcers BLE with recent admission for skin grafts that developed dehiscence and necrosis due to inability to care for her wound. She was admitted on 08/19/19 due to increase in drainage with foul smell and pain. She was started on antibiotics as well as IVF for acute on chronic renal failure. She underwent I &D of wound on 08/31 and STSG placed 09/02 by Dr. Sharol Given. Prevena wound VAC to stay in place for at least a week and to continue on antibiotics X 4 weeks due to pseudomonas aeruginosa/proteus mirabilis positive wound cultures. Leucocytosis has improved from 16.1--->11  and ABLA being monitored. Therapy initiated and CIR recommended due to functional decline.Patient transferred to CIR on 08/27/2019 .   Patient currently requires CGA with mobility secondary to muscle weakness, decreased cardiorespiratoy endurance,   and decreased standing balance.  Prior to hospitalization, patient was modified independent  with mobility and lived with Other (Comment)(Lives in the same house as her sister but upstairs (boarding house)) in a House(boarding house) home.  Home access is 2Stairs to enter.  Patient will benefit from skilled PT intervention to maximize safe functional mobility, minimize fall risk and decrease caregiver burden for planned discharge home with 24 hour supervision.  Anticipate patient will benefit from follow up [facility] at discharge.  PT - End of Session Activity Tolerance: Tolerates 30+ min activity with multiple rests Endurance Deficit: Yes Endurance Deficit Description: requires multiple rest breaks during session PT Assessment Rehab Potential (ACUTE/IP ONLY): Excellent PT Barriers to Discharge: Home environment access/layout;Wound Care;Inaccessible home environment PT Patient demonstrates impairments in the following area(s): Balance;Edema;Safety;Motor;Nutrition;Pain;Skin Integrity;Endurance PT Transfers Functional Problem(s): Bed Mobility;Bed to Chair;Car;Furniture PT Locomotion Functional Problem(s): Ambulation;Wheelchair Mobility;Stairs PT Plan PT Intensity: Minimum of 1-2 x/day ,45 to 90 minutes PT Frequency: 5 out of 7 days PT Duration Estimated Length of Stay: ~7 days PT Treatment/Interventions:  Ambulation/gait training;Discharge planning;Functional mobility training;Psychosocial support;Therapeutic Activities;Balance/vestibular training;Disease management/prevention;Neuromuscular re-education;Skin care/wound management;Therapeutic Exercise;Wheelchair propulsion/positioning;DME/adaptive equipment instruction;Pain management;Splinting/orthotics;UE/LE  Strength taining/ROM;Community reintegration;Functional electrical stimulation;Patient/family education;Stair training;UE/LE Coordination activities PT Transfers Anticipated Outcome(s): mod-I with LRAD PT Locomotion Anticipated Outcome(s): mod-I with LRAD PT Recommendation Follow Up Recommendations: Home health PT;24 hour supervision/assistance Patient destination: Home Equipment Recommended: To be determined;Other (comment) Equipment Details: pt has SPC, RW, rollator, and [facility]  Skilled Therapeutic Intervention Evaluation completed (see details above and below) with education on PT POC and goals and individual treatment initiated with focus on transfers, gait training, stair navigation, and activity tolerance/endurance, as well as education regarding daily therapy schedule, weekly team meetings, purpose of PT evaluation, and other CIR information. Pt received sitting in recliner and agreeable to therapy session. Therapist retrieved w/c with cushion and B LE elevating leg rests for pt to use while in CIR to allow for increased pressure relief and increased upright activity tolerance. Pt reports pain level of 8/10 in B LEs with RN notified and present for medication administration. Sit<>stands using RW to/from chairs with armrests and CGA for steadying throughout session. Ambulated ~69f using RW with CGA for steadying to w/c. Transported to/from gym in w/c. Ambulated 966fusing RW with CGA for steadying and pt demonstrating decreased gait speed with decreased B LE step length. Ascended/descended 4 steps using B HRs with CGA for steadying - pt did not feel comfortable performing with only using L HR per home set-up at this time. Pt required increased time for line management during functional mobility throughout session. Transported to car room in w/c. Performed ambulatory car transfer using RW with CGA for steadying while standing and min assist for B LE management in/out of car with cuing for pt to scoot  backwards more in seat prior to moving LEs. Transported back to room in w/c. Ambulated ~1064f/c>recliner using RW with CGA for steadying. Pt left sitting in recliner with needs in reach and chair alarm on.  PT Evaluation Precautions/Restrictions Precautions Precautions: Fall;Other (comment) Precaution Comments: BLE wound vacs Restrictions RLE Weight Bearing: Weight bearing as tolerated LLE Weight Bearing: Weight bearing as tolerated Pain Pain Assessment Pain Scale: 0-10 Pain Score: 8  Pain Type: Acute pain;Surgical pain Pain Location: Leg(bilateral legs) Pain Orientation: Lower Pain Descriptors / Indicators: Aching;Discomfort Pain Onset: On-going Pain Intervention(s): Medication (See eMAR);RN made aware;Rest;Relaxation;Repositioned Home Living/Prior Functioning Home Living Available Help at Discharge: Family;Available 24 hours/day Type of Home: House(boarding house) Home Access: Stairs to enter [employer] Steps: 2 Entrance Stairs-Rails: Left;Right(can't reach both at same time) Home Layout: Two level Alternate Level Stairs-Number of Steps: 8 Alternate Level Stairs-Rails: Left Additional Comments: sister lives in same boarding house in next door apartment with a bathroom; pt states she uses a BSC most of the time as her room doesn't have a bathroom  Lives With: Other (Comment)(Lives in the same house as her sister but upstairs (boarding house)) Prior Function Level of Independence: Independent with basic ADLs;Requires assistive device for independence;Needs assistance with homemaking;Independent with transfers(reports ambulated mod-I with SPC most of the time but does have RW and rollator)  Able to Take Stairs?: Yes Driving: No Leisure: Hobbies-yes (Comment) Comments: does crossword puzzles and spends time with her grandson Vision/Perception  Perception Perception: Within Functional Limits Praxis Praxis: Intact  Cognition Overall Cognitive Status: Within  Functional Limits for tasks assessed Arousal/Alertness: Awake/alert Orientation Level: Oriented X4 Attention: Focused;Sustained Focused Attention: Appears intact Sustained Attention: Appears intact Memory: Appears intact Safety/Judgment: Appears intact Sensation Sensation Light Touch: Appears  Intact(difficult to assess in bilateral lower legs due to unna boots) Hot/Cold: Not tested Proprioception: Appears Intact Stereognosis: Not tested Coordination Gross Motor Movements are Fluid and Coordinated: Yes Fine Motor Movements are Fluid and Coordinated: Yes Motor  Motor Motor: Other (comment) Motor - Skilled Clinical Observations: generalized weakness and deconditioning with increased pain in BLEs  Mobility Transfers Transfers: Sit to Stand;Stand Pivot Transfers;Stand to Sit Sit to Stand: Contact Guard/Touching assist Stand to Sit: Contact Guard/Touching assist Stand Pivot Transfers: Contact Guard/Touching assist Transfer (Assistive device): Rolling walker Locomotion  Gait Ambulation: Yes Gait Assistance: Contact Guard/Touching assist Gait Distance (Feet): 96 Feet Assistive device: Rolling walker Gait Assistance Details: Verbal cues for safe use of DME/AE;Verbal cues for gait pattern Gait Gait: Yes Gait Pattern: Impaired Gait Pattern: Decreased step length - left;Decreased step length - right;Decreased stride length;Poor foot clearance - left;Poor foot clearance - right Gait velocity: Decreased Stairs / Additional Locomotion Stairs: Yes Stairs Assistance: Contact Guard/Touching assist;Minimal Assistance - Patient > 75% Stair Management Technique: Two rails Number of Stairs: 4 Height of Stairs: 6  Trunk/Postural Assessment  Cervical Assessment Cervical Assessment: Within Functional Limits Thoracic Assessment Thoracic Assessment: Within Functional Limits Lumbar Assessment Lumbar Assessment: Within Functional Limits Postural Control Postural Control: Deficits on  evaluation Postural Limitations: decreased in standing with pt using BUE support on RW  Balance Balance Balance Assessed: Yes Static Sitting Balance Static Sitting - Balance Support: No upper extremity supported;Feet supported Static Sitting - Level of Assistance: 6: Modified independent (Device/Increase time) Dynamic Sitting Balance Dynamic Sitting - Balance Support: Feet supported Dynamic Sitting - Level of Assistance: 5: Stand by assistance Static Standing Balance Static Standing - Balance Support: Bilateral upper extremity supported;During functional activity Static Standing - Level of Assistance: 5: Stand by assistance Dynamic Standing Balance Dynamic Standing - Balance Support: During functional activity;Bilateral upper extremity supported Dynamic Standing - Level of Assistance: 4: Min assist Extremity Assessment      RLE Assessment RLE Assessment: Exceptions to [facility] General Strength Comments: grossly 3+/5 to 4/5 demonstrated functionally; limited ankle movement due to unna boot LLE Assessment General Strength Comments: grossly 3+/5 to 4/5 demonstrated functionally; limited ankle movement due to unna boot    Refer to Care Plan for Long Term Goals  Recommendations for other services: None   Discharge Criteria: Patient will be discharged from PT if patient refuses treatment 3 consecutive times without medical reason, if treatment goals not met, if there is a change in medical status, if patient makes no progress towards goals or if patient is discharged from hospital.  The above assessment, treatment plan, treatment alternatives and goals were discussed and mutually agreed upon: by patient  Tawana Scale, PT, DPT 08/28/2019, 7:56 AM

## 2019-08-28 NOTE — Progress Notes (Signed)
Occupational Therapy Session Note  Patient Details  Name: Kari [name] MRN: [id number] Date of Birth: 27-Mar-1961  Today's Date: 08/28/2019 OT Individual Time: 3536-1443 OT Individual Time Calculation (min): 23 min    Short Term Goals: Week 1:  OT Short Term Goal 1 (Week 1): STGs equal to LTGs set at modifeid independent overall based on ELOS.  Skilled Therapeutic Interventions/Progress Updates:  Pt worked on BUE strengthening exercises from seated position in the bedside recliner.  She was able to complete 1 set of 10 reps for bilateral shoulder horizontal abduction and shoulder flexion with min instructional cueing.  She then completed with 1 set of 15 reps with elbow extension bilaterally.  Rest breaks were needed between each exercise for 1-2 mins secondary to increased fatigue.  All exercises were completed with use of the light resistance therapy band.  Pt left sitting in the bedside recliner at end of session with alarm pad in place and call button and phone in reach.     Therapy Documentation Precautions:  Precautions Precautions: Fall, Other (comment) Precaution Comments: BLE wound vacs Restrictions Weight Bearing Restrictions: No RLE Weight Bearing: Weight bearing as tolerated LLE Weight Bearing: Weight bearing as tolerated  Pain: Pain Assessment Pain Scale: 0-10 Pain Score: 8  Pain Type: Acute pain;Surgical pain Pain Location: Leg(bilateral legs) Pain Orientation: Lower Pain Descriptors / Indicators: Aching;Discomfort Pain Onset: On-going Pain Intervention(s): Medication (See eMAR);RN made aware;Rest;Relaxation;Repositioned    Therapy/Group: Individual Therapy  Kelleigh Skerritt OTR/L 08/28/2019, 4:06 PM

## 2019-08-28 NOTE — Progress Notes (Signed)
Occupational Therapy Session Note  Patient Details  Name: Kari [name] MRN: [id number] Date of Birth: 08-Jul-1961  Today's Date: 08/28/2019 OT Individual Time: 1015-1055 OT Individual Time Calculation (min): 40 min    Short Term Goals: Week 1:     Skilled Therapeutic Interventions/Progress Updates:    1:1 Ttherapetuic exercises to address overall endurance and strengthening. Performed UB exercises for shoulders, bicep, tricep, and core with 3 lb weight in unsupported sitting position. 11 sit to stands with one UE support for endurance training within 2-3 min. LE exercises against gravity in reps of 20 including hip abduction/ adduction, hip flexion against gravity, calf stretches. Pt requested rest break inbetween each activity.   Pt known to this therapist and reports goals are to get home quickly but safely.   Therapy Documentation Precautions:  Precautions Precautions: Fall, Other (comment) Precaution Comments: BLE wound vacs Restrictions Weight Bearing Restrictions: No RLE Weight Bearing: Weight bearing as tolerated LLE Weight Bearing: Weight bearing as tolerated General:   Vital Signs:  Pain:   ADL:   Vision   Perception    Praxis Praxis: Intact Exercises:   Other Treatments:     Therapy/Group: Individual Therapy  Willeen Cass [facility] 08/28/2019, 1:00 PM

## 2019-08-28 NOTE — Progress Notes (Signed)
Patient offered stool softer during shift and refused. No bm documented since 8/29. Education provided at time.

## 2019-08-28 NOTE — Progress Notes (Signed)
Inpatient Rehabilitation  Patient information reviewed and entered into eRehab system by Nakiya Rallis M. Stevon Gough, M.A., CCC/SLP, PPS Coordinator.  Information including medical coding, functional ability and quality indicators will be reviewed and updated through discharge.    

## 2019-08-28 NOTE — Evaluation (Signed)
Occupational Therapy Assessment and Plan  Patient Details  Name: Kari [name] MRN: [id number] Date of Birth: May 05, 1961  OT Diagnosis: acute pain and muscular wasting and disuse atrophy Rehab Potential: Rehab Potential (ACUTE ONLY): Good ELOS: 5-7 days   Today's Date: 08/28/2019 OT Individual Time: 1015-1055 OT Individual Time Calculation (min): 40 min     Problem List:  Patient Active Problem List   Diagnosis Date Noted  . Debility 08/27/2019  . Multiple open wounds of lower leg   . Moderate protein-calorie malnutrition ([place])   . Non-healing ulcer ([place]) 08/19/2019  . Postoperative pain   . Labile blood pressure   . Nonischemic cardiomyopathy ([place])   . Physical debility 07/20/2019  . Idiopathic chronic venous hypertension of lower extremity with ulcer, bilateral ([place]) 07/15/2019  . H/O skin graft 05/13/2019  . CKD (chronic kidney disease), stage III ([place]) 01/13/2019  . Infected wound 12/26/2018  . Idiopathic chronic venous hypertension of both lower extremities with ulcer and inflammation ([place])   . History of fall 05/10/2018  . Hyperkalemia 02/21/2018  . Hypotension 02/21/2018  . Sepsis ([place]) 02/21/2018  . Encounter for central line placement   . Mitral regurgitation 10/30/2017  . Acute kidney injury ([place]) 07/21/2017  . Generalized weakness 07/20/2017  . Shock circulatory ([place])   . Acute respiratory failure with hypoxia ([place])   . Pulmonary edema   . Cardiac arrest ([place]) 06/26/2017  . Ventricular fibrillation ([place])   . Acute on chronic diastolic heart failure ([place])   . Abnormal ECG   . Cellulitis 04/02/2017  . AKI (acute kidney injury) ([place]) 12/17/2016  . Cellulitis and abscess of right lower extremity 12/17/2016  . Syncope and collapse 12/17/2016  . Peripheral edema 12/17/2016  . Hypokalemia 12/17/2016  . Anemia of chronic disease 12/17/2016  . Acute diastolic CHF (congestive heart failure) ([place]) 03/14/2014  . CHF (congestive heart failure) ([place]) 03/12/2014  .  Sarcoidosis ([place]) 03/12/2014  . Severe sepsis ([place]) 10/17/2013  . ARF (acute renal failure) ([place]) 10/08/2013  . Cellulitis of multiple sites of lower extremity 10/08/2013  . Morbid obesity ([place]) 10/08/2013  . Volume depletion 10/08/2013  . Venous (peripheral) insufficiency 10/28/2012  . Mediastinal lymphadenopathy 10/16/2012  . Pulmonary nodules 10/16/2012  . 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 Chest pain 09/02/2012  . Asthma   . Hypertension   . Depression   . Venous stasis ulcers ([place]) 07/29/2012  . Varicose veins of lower extremities with ulcer ([place]) 07/08/2012  . Venous ulcer of leg ([place]) 07/08/2012    Past Medical History:  Past Medical History:  Diagnosis Date  . Arthritis   . Asthma   . CHF (congestive heart failure) ([place]) 03/12/2014   a. EF 40-45% by echo in 06/2017 with normal cors by cath. ICD placed following VT arrest  . Depression   . Headache(784.0)   . History of blood transfusion   . Hyperlipidemia   . Hypertension   . Lung nodules   . Myocardial infarction ([place]) 06/26/2014  . Renal disorder    followed by [facility]  . Sarcoidosis   . Ulcer    recurring, from chronic venous insufficiency   Past Surgical History:  Past Surgical History:  Procedure Laterality Date  . cataract surgery Left 07-2013  . I&D EXTREMITY Bilateral 12/31/2018   Procedure: DEBRIDEMENT BILATERAL LEGS, APPLY VAC X 2;  Surgeon: [name], MD;  Location: [facility];  Service: Orthopedics;  Laterality: Bilateral;  . I&D EXTREMITY Bilateral  01/02/2019   Procedure: REPEAT DEBRIDEMENT BILATERAL LEGS, APPLY VAC X 2;  Surgeon: [name], MD;  Location: [facility];  Service: Orthopedics;  Laterality: Bilateral;  . I&D EXTREMITY Bilateral 07/15/2019   Procedure: IRRIGATION AND DEBRIDEMENT VENOUS STASIS INSUFFICIENCY ULCERATIONS BILATERAL LOWER EXTREMITIES;  Surgeon: [name], MD;  Location: [facility];  Service: Orthopedics;   Laterality: Bilateral;  . I&D EXTREMITY Bilateral 08/23/2019   Procedure: DEBRIDEMENT EXTREMITY;  Surgeon: [name], MD;  Location: [facility];  Service: Orthopedics;  Laterality: Bilateral;  . ICD IMPLANT N/A 07/05/2017   Procedure: ICD Implant;  Surgeon: Constance Haw, MD;  Location: [facility];  Service: Cardiovascular;  Laterality: N/A;  . LEFT HEART CATH AND CORONARY ANGIOGRAPHY N/A 07/03/2017   Procedure: Left Heart Cath and Coronary Angiography;  Surgeon: Belva Crome, MD;  Location: [facility];  Service: Cardiovascular;  Laterality: N/A;  . SKIN SPLIT GRAFT Bilateral 01/07/2019   Procedure: SPLIT THICKNESS SKIN GRAFT BILATERAL LEGS, APPLY VAC;  Surgeon: [name], MD;  Location: [facility];  Service: Orthopedics;  Laterality: Bilateral;  . SKIN SPLIT GRAFT Bilateral 07/17/2019   Procedure: REPEAT IRRIGATION AND DEBRIDEMENT BILATERAL LOWER EXTREMITIES, SKIN GRAFT;  Surgeon: [name], MD;  Location: [facility];  Service: Orthopedics;  Laterality: Bilateral;  . SKIN SPLIT GRAFT Bilateral 08/26/2019   Procedure: SKIN GRAFT SPLIT THICKNESS BILATERAL LEGS, APPLY WOUND VAC;  Surgeon: [name], MD;  Location: [facility];  Service: Orthopedics;  Laterality: Bilateral;    Assessment & Plan Clinical Impression: Patient is a 58 y.o. year old female with recent admission to the hospital on 08/19/19 due to increase in drainage with foul smell and pain. She was started on antibiotics as well as IVF for acute on chronic renal failure. She underwent I &D of wound on 08/31 and STSG placed 09/02 by Dr. Sharol Given. Prevena wound VAC to stay in place for at least a week and to continue on antibiotics X 4 weeks due to pseudomonas aeruginosa/proteus mirabilis positive wound cultures.  Patient transferred to CIR on 08/27/2019 .    Patient currently requires min with basic self-care skills secondary to muscle weakness and decreased standing balance, decreased postural control and decreased balance  strategies.  Prior to hospitalization, patient could complete ADLs with modified independent .  Patient will benefit from skilled intervention to decrease level of assist with basic self-care skills and increase independence with basic self-care skills prior to discharge home with care partner.  Anticipate patient will require intermittent supervision and follow up home health.  OT - End of Session Activity Tolerance: Tolerates 30+ min activity with multiple rests Endurance Deficit: Yes OT Assessment Rehab Potential (ACUTE ONLY): Good OT Barriers to Discharge: Inaccessible home environment OT Barriers to Discharge Comments: upstairs bedroom OT Patient demonstrates impairments in the following area(s): Balance;Endurance;Pain OT Basic ADL's Functional Problem(s): Grooming;Bathing;Dressing;Toileting OT Transfers Functional Problem(s): Toilet OT Plan OT Intensity: Minimum of 1-2 x/day, 45 to 90 minutes OT Frequency: 5 out of 7 days OT Duration/Estimated Length of Stay: 5-7 days OT Treatment/Interventions: Balance/vestibular training;Discharge planning;Disease Lawyer;Functional mobility training;Pain management;Patient/family education;Psychosocial support;Self Care/advanced ADL retraining;Skin care/wound managment;Therapeutic Activities;Therapeutic Exercise;Community reintegration;UE/LE Coordination activities;UE/LE Strength taining/ROM;Wheelchair propulsion/positioning;Neuromuscular re-education OT Self Feeding Anticipated Outcome(s): Independent OT Basic Self-Care Anticipated Outcome(s): Modified independent OT Toileting Anticipated Outcome(s): Modified independent OT Bathroom Transfers Anticipated Outcome(s): Modified independent OT Recommendation Patient destination: Home Follow Up Recommendations: Home health OT Equipment Recommended: To be determined   Skilled Therapeutic  Intervention Pt worked on bathing and dressing sit to stand from  the EOB during session.  She was able to complete transfer from supine to sit EOB with supervision and then complete UB and dressing at supervision level.  She completed LB bathing with min assist as well as donning her slip on slippers with min assist.  Toilet transfer was completed to the bedside with min assist to the wide BSC.  She also completed toilet hygiene as well with min assist.  Wound vacs and lines were managed by therapist during session.  Pt was left in the bedside recliner per her choice at the end of the session with alarm in place and call button and phone in reach.    OT Evaluation Precautions/Restrictions  Precautions Precautions: Fall;Other (comment) Precaution Comments: BLE wound vacs Restrictions Weight Bearing Restrictions: No RLE Weight Bearing: Weight bearing as tolerated LLE Weight Bearing: Weight bearing as tolerated   Pain Pain Assessment Pain Scale: 0-10 Pain Score: 8  Pain Type: Acute pain;Surgical pain Pain Location: Leg(bilateral legs) Pain Orientation: Lower Pain Descriptors / Indicators: Aching;Discomfort Pain Onset: On-going Pain Intervention(s): Medication (See eMAR);RN made aware;Rest;Relaxation;Repositioned Home Living/Prior Functioning Home Living Family/patient expects to be discharged to:: Private residence Living Arrangements: Other (Comment)(Boarding house) Available Help at Discharge: Family, Available 24 hours/day Type of Home: House(boarding house) Home Access: Stairs to enter Technical brewer of Steps: 2 Entrance Stairs-Rails: Right, Left Home Layout: Two level Alternate Level Stairs-Number of Steps: 8 Alternate Level Stairs-Rails: Left Bathroom Shower/Tub: Tub/shower unit, Architectural technologist: Standard Bathroom Accessibility: Yes Additional Comments: sister lives in same boarding house in next door apartment  Lives With: Other (Comment)(Lives in the same house as her sister but upstairs (boarding house)) IADL  History Homemaking Responsibilities: No Meal Prep Responsibility: No Laundry Responsibility: No Current License: No Occupation: On disability Prior Function Level of Independence: Independent with basic ADLs, Requires assistive device for independence, Needs assistance with homemaking  Able to Take Stairs?: Yes Driving: No Vocation: On disability Leisure: Hobbies-yes (Comment) Comments: does crossword puzzles and spends time with her grandson ADL  See ADL section for details Vision Baseline Vision/History: Wears glasses Wears Glasses: Reading only Patient Visual Report: No change from baseline Vision Assessment?: No apparent visual deficits Perception  Perception: Within Functional Limits Praxis Praxis: Intact Cognition Overall Cognitive Status: Within Functional Limits for tasks assessed Arousal/Alertness: Awake/alert Orientation Level: Person;Place;Situation Person: Oriented Place: Oriented Situation: Oriented Year: 2020 Month: July Day of Week: Correct Memory: Appears intact Immediate Memory Recall: Sock;Blue;Bed Memory Recall Sock: Without Cue Memory Recall Blue: Without Cue Memory Recall Bed: Without Cue Attention: Focused;Sustained Focused Attention: Appears intact Sustained Attention: Appears intact Awareness: Appears intact Problem Solving: Appears intact Safety/Judgment: Appears intact Sensation Sensation Light Touch: Appears Intact Hot/Cold: Appears Intact Proprioception: Appears Intact Stereognosis: Appears Intact Coordination Gross Motor Movements are Fluid and Coordinated: Yes Fine Motor Movements are Fluid and Coordinated: Yes Motor  Motor Motor: Other (comment) Motor - Skilled Clinical Observations: generalized weakness and deconditioning with increased pain in BLEs Mobility  Bed Mobility Bed Mobility: Supine to Sit Rolling Right: Supervision/verbal cueing Transfers Sit to Stand: Minimal Assistance - Patient > 75% Stand to Sit: Minimal  Assistance - Patient > 75%  Trunk/Postural Assessment  Cervical Assessment Cervical Assessment: Within Functional Limits Thoracic Assessment Thoracic Assessment: Within Functional Limits Lumbar Assessment Lumbar Assessment: Within Functional Limits Postural Control Postural Control: Within Functional Limits  Balance Balance Balance Assessed: Yes Static Sitting Balance Static Sitting - Balance Support: No upper extremity supported;Feet  supported Static Sitting - Level of Assistance: 6: Modified independent (Device/Increase time) Dynamic Sitting Balance Dynamic Sitting - Balance Support: No upper extremity supported;Feet supported Dynamic Sitting - Level of Assistance: 5: Stand by assistance Static Standing Balance Static Standing - Balance Support: During functional activity Static Standing - Level of Assistance: 5: Stand by assistance Dynamic Standing Balance Dynamic Standing - Balance Support: During functional activity Dynamic Standing - Level of Assistance: 4: Min assist Extremity/Trunk Assessment RUE Assessment RUE Assessment: Within Functional Limits General Strength Comments: strength 4/5 throughout LUE Assessment LUE Assessment: Within Functional Limits General Strength Comments: strength 4/5 throughout     Refer to Care Plan for Long Term Goals  Recommendations for other services: None    Discharge Criteria: Patient will be discharged from OT if patient refuses treatment 3 consecutive times without medical reason, if treatment goals not met, if there is a change in medical status, if patient makes no progress towards goals or if patient is discharged from hospital.  The above assessment, treatment plan, treatment alternatives and goals were discussed and mutually agreed upon: by patient  Devonne Lalani OTR/L 08/28/2019, 3:47 PM

## 2019-08-28 NOTE — Progress Notes (Addendum)
Social Work Patient ID: Kari [name], female   DOB: 05-May-1961, 58 y.o.   MRN: [id number]   Kari Hashimoto, LCSW  Social Worker  Physical Medicine and Rehabilitation  Progress Notes  Signed  Date of Service:  07/20/2019  2:43 PM      Related encounter: Admission (Discharged) from 07/20/2019 in [facility]      Signed        Show:Clear all [x] Manual[x] Template[] Copied  Added by: [x] Scout Guyett, Gardiner Rhyme, LCSW  [] Hover for details Social Work Assessment and Plan    Patient Details  Name: Kari [name] MRN: [id number] Date of Birth: 04-Aug-1961   Today's Date: 08/28/2019   Problem List:      Patient Active Problem List    Diagnosis Date Noted  . Physical debility 07/20/2019  . Idiopathic chronic venous hypertension of lower extremity with ulcer, bilateral ([place]) 07/15/2019  . H/O skin graft 05/13/2019  . CKD (chronic kidney disease), stage III ([place]) 01/13/2019  . Infected wound 12/26/2018  . Idiopathic chronic venous hypertension of both lower extremities with ulcer and inflammation ([place])    . History of fall 05/10/2018  . Hyperkalemia 02/21/2018  . Hypotension 02/21/2018  . Sepsis ([place]) 02/21/2018  . Encounter for central line placement    . Mitral regurgitation 10/30/2017  . Acute kidney injury ([place]) 07/21/2017  . Generalized weakness 07/20/2017  . Shock circulatory ([place])    . Acute respiratory failure with hypoxia ([place])    . Pulmonary edema    . Cardiac arrest ([place]) 06/26/2017  . Ventricular fibrillation ([place])    . Acute on chronic diastolic heart failure ([place])    . Abnormal ECG    . Cellulitis 04/02/2017  . AKI (acute kidney injury) ([place]) 12/17/2016  . Cellulitis and abscess of right lower extremity 12/17/2016  . Syncope and collapse 12/17/2016  . Peripheral edema 12/17/2016  . Hypokalemia 12/17/2016  . Anemia of chronic disease 12/17/2016  . Acute diastolic CHF (congestive heart failure) ([place]) 03/14/2014  . CHF  (congestive heart failure) ([place]) 03/12/2014  . Sarcoidosis ([place]) 03/12/2014  . Severe sepsis ([place]) 10/17/2013  . ARF (acute renal failure) ([place]) 10/08/2013  . Cellulitis of multiple sites of lower extremity 10/08/2013  . Morbid obesity ([place]) 10/08/2013  . Volume depletion 10/08/2013  . Venous (peripheral) insufficiency 10/28/2012  . Mediastinal lymphadenopathy 10/16/2012  . Pulmonary nodules 10/16/2012  . 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 Chest pain 09/02/2012  . Asthma    . Hypertension    . Depression    . Venous stasis ulcers ([place]) 07/29/2012  . Varicose veins of lower extremities with ulcer ([place]) 07/08/2012  . Venous ulcer of leg ([place]) 07/08/2012   Past Medical History:      Past Medical History:  Diagnosis Date  . Arthritis    . Asthma    . CHF (congestive heart failure) ([place]) 03/12/2014    a. EF 40-45% by echo in 06/2017 with normal cors by cath. ICD placed following VT arrest  . Depression    . Headache(784.0)    . History of blood transfusion    . Hyperlipidemia    . Hypertension    . Lung nodules    . Myocardial infarction ([place]) 06/26/2014  . Renal disorder      followed by [facility]  . Sarcoidosis    . Ulcer      recurring, from chronic venous insufficiency   Past Surgical History:  Past Surgical History:  Procedure Laterality Date  . cataract surgery Left 07-2013  . I&D EXTREMITY Bilateral 12/31/2018    Procedure: DEBRIDEMENT BILATERAL LEGS, APPLY VAC X 2;  Surgeon: [name], MD;  Location: [facility];  Service: Orthopedics;  Laterality: Bilateral;  . I&D EXTREMITY Bilateral 01/02/2019    Procedure: REPEAT DEBRIDEMENT BILATERAL LEGS, APPLY VAC X 2;  Surgeon: [name], MD;  Location: [facility];  Service: Orthopedics;  Laterality: Bilateral;  . I&D EXTREMITY Bilateral 07/15/2019    Procedure: IRRIGATION AND DEBRIDEMENT VENOUS STASIS INSUFFICIENCY ULCERATIONS BILATERAL LOWER EXTREMITIES;   Surgeon: [name], MD;  Location: [facility];  Service: Orthopedics;  Laterality: Bilateral;  . ICD IMPLANT N/A 07/05/2017    Procedure: ICD Implant;  Surgeon: Constance Haw, MD;  Location: [facility];  Service: Cardiovascular;  Laterality: N/A;  . LEFT HEART CATH AND CORONARY ANGIOGRAPHY N/A 07/03/2017    Procedure: Left Heart Cath and Coronary Angiography;  Surgeon: Belva Crome, MD;  Location: [facility];  Service: Cardiovascular;  Laterality: N/A;  . SKIN SPLIT GRAFT Bilateral 01/07/2019    Procedure: SPLIT THICKNESS SKIN GRAFT BILATERAL LEGS, APPLY VAC;  Surgeon: [name], MD;  Location: [facility];  Service: Orthopedics;  Laterality: Bilateral;   Social History:  reports that she has never smoked. She has never used smokeless tobacco. She reports that she does not drink alcohol or use drugs.   Family / Support Systems Marital Status: Single Patient Roles: Parent, Other (Comment)(sibling) Children: Daughter in [place] and granddaughter local Other Supports: [name]-sister [phone]-cell Anticipated Caregiver: Sister Ability/Limitations of Caregiver: supervision-min assist level Caregiver Availability: 24/7 Family Dynamics: Close knit family sister has assisted with her care for the last three years. Daughter and granddaughter are involved also. Pt has church members and friends who are supportive. Pt feels she has good supports.   Social History Preferred language: English Religion: Holiness Cultural Background: No issues Education: HS Read: Yes Write: Yes Employment Status: Disabled Public relations account executive Issues: No issues Guardian/Conservator: None-according to MD pt is capable of making her own decisions while here    Abuse/Neglect Abuse/Neglect Assessment Can Be Completed: Yes Physical Abuse: Denies Verbal Abuse: Denies Sexual Abuse: Denies Exploitation of patient/patient's resources: Denies Self-Neglect: Denies   Emotional Status Pt's  affect, behavior and adjustment status: Pt is motivated to do well and finally heal from her skin grafts. She went through this in Jan and has been dealing with this since then. She was not healing and needed to have it again. She was independent and could do for herself like she plans on again. Her sister does help her some. Recent Psychosocial Issues: other health issues Psychiatric History: No history deferred depression screen due to she is optimistic and feels she will do well here and be home soon. She is coping appropriately at this time Substance Abuse History: No issues   Patient / Family Perceptions, Expectations & Goals Pt/Family understanding of illness & functional limitations: Pt is able to explain her surgery and the reason for it. She is hopeful she will do well and recover from this. She does talk with her MD's and feels she has a good understanding of her treatment plan going forward. She loves Dr. Sharol Given. Premorbid pt/family roles/activities: Sister, Mom, grandmother, friend, church member, etc Anticipated changes in roles/activities/participation: resume Pt/family expectations/goals: Pt states: " I want to be able to walk with my cane when i leave here."  Sister states: "She is a  hard worker and will do it if she puts her mind to it."   [employer]: None Premorbid Home Care/DME Agencies: Other (Comment)(Dr [place] office weekly for her legs) Transportation available at discharge: [facility] uses for appointments Resource referrals recommended: Support group (specify)   Discharge Planning Living Arrangements: Other relatives Support Systems: Children, Other relatives, Friends/neighbors, Church/faith community Type of Residence: Private residence Insurance Resources: [employer], [employer] (specify county) Museum/gallery curator Resources: [employer] Screen Referred: No Living Expenses: Education officer, community Management: Patient, Family Does the patient have any problems obtaining  your medications?: No Home Management: Both she and sister Patient/Family Preliminary Plans: Return home with sister who was helping her prior to admission. They both help each other and seem to do well together. Pt wants to get back on her feet before discharge from here. Will await therapy team evaluations and work on discharge needs. Social Work Anticipated Follow Up Needs: HH/OP, Support Group   Clinical Impression Pleasant motivated female who is ready to work and get back to her independent level. She has been through this in 12/2018 and knows what to expect. Her sister is involved and will assist if needed. Will await therapy evaluations and work on discharge needs. Pt is known to this worker from previous admission in July 2020   Kari [name] 07/20/2019, 2:43 PM

## 2019-08-29 ENCOUNTER — Inpatient Hospital Stay (HOSPITAL_COMMUNITY): Payer: Medicare Other | Admitting: Physical Therapy

## 2019-08-29 ENCOUNTER — Inpatient Hospital Stay (HOSPITAL_COMMUNITY): Payer: Medicare Other | Admitting: Occupational Therapy

## 2019-08-29 LAB — CBC
HCT: 26.9 % — ABNORMAL LOW (ref 36.0–46.0)
Hemoglobin: 7.7 g/dL — ABNORMAL LOW (ref 12.0–15.0)
MCH: 28.2 pg (ref 26.0–34.0)
MCHC: 28.6 g/dL — ABNORMAL LOW (ref 30.0–36.0)
MCV: 98.5 fL (ref 80.0–100.0)
Platelets: 244 10*3/uL (ref 150–400)
RBC: 2.73 MIL/uL — ABNORMAL LOW (ref 3.87–5.11)
RDW: 17.6 % — ABNORMAL HIGH (ref 11.5–15.5)
WBC: 7.6 10*3/uL (ref 4.0–10.5)
nRBC: 0 % (ref 0.0–0.2)

## 2019-08-29 MED ORDER — SODIUM CHLORIDE 0.9% FLUSH: 10.0000 mL | INTRAVENOUS | Status: DC | PRN

## 2019-08-29 MED ORDER — POTASSIUM CHLORIDE CRYS ER 20 MEQ PO TBCR
40.0000 meq | EXTENDED_RELEASE_TABLET | Freq: Two times a day (BID) | ORAL | Status: AC
  Administered 2019-08-29 (×2): 40 meq via ORAL
  Filled 2019-08-29 (×2): qty 2

## 2019-08-29 NOTE — Progress Notes (Signed)
[place] PHYSICAL MEDICINE & REHABILITATION PROGRESS NOTE   Subjective/Complaints:  Pt reports doesn't like pants- likes dresses- just came back from PT_ "walked well today" per pt- planning to do OT in a few minutes.   ROS- neg CP, SOB, N/V/D  Objective:   No results found. Recent Labs    08/28/19 0509 08/29/19 0655  WBC 10.8* 7.6  HGB 7.4* 7.7*  HCT 24.9* 26.9*  PLT 256 244   Recent Labs    08/27/19 0314 08/28/19 0509  NA 137 139  K 3.7 3.2*  CL 105 106  CO2 23 26  GLUCOSE 156* 104*  BUN 12 14  CREATININE 1.20* 1.07*  CALCIUM 8.6* 8.6*    Intake/Output Summary (Last 24 hours) at 08/29/2019 1020 Last data filed at 08/29/2019 0900 Gross per 24 hour  Intake 1002.03 ml  Output -  Net 1002.03 ml     Physical Exam: Vital Signs Blood pressure 109/67, pulse 67, temperature 98.6 F (37 C), temperature source Oral, resp. rate 18, height 5\' 7"  (1.702 m), weight 130 kg, last menstrual period 11/21/2011, SpO2 100 %.  General: labs and vitals reviewed Awake, alert, appropriate, on phone with sister, bright affect, sitting next to bed in chair, NAD Mood and affect are appropriate Heart: no JVD seen; RRR Lungs: Clear to auscultation B/L Abdomen: Positive bowel sounds, soft nontender to palpation, nondistended Extremities: No clubbing, cyanosis, or edema Skin: ACE wraps B legs with wound vacs Neurologic: Cranial nerves II through XII intact, motor strength is 5/5 in bilateral deltoid, bicep, tricep, grip,4- hip flexor, knee extensors, ankle dorsiflexor and plantar flexor Sensory exam normal sensation to light touch and proprioception in bilateral upper and lower extremities Cerebellar exam normal finger to nose to finger as well as heel to shin in bilateral upper and lower extremities Musculoskeletal: Full range of motion in all 4 extremities. No joint swelling    Assessment/Plan: 1. Functional deficits secondary to debility  which require 3+ hours per day of  interdisciplinary therapy in a comprehensive inpatient rehab setting.  Physiatrist is providing close team supervision and 24 hour management of active medical problems listed below.  Physiatrist and rehab team continue to assess barriers to discharge/monitor patient progress toward functional and medical goals  Care Tool:  Bathing    Body parts bathed by patient: Right arm, Left arm, Chest, Abdomen, Face, Front perineal area, Buttocks, Right upper leg, Left upper leg     Body parts n/a: Right lower leg, Left lower leg   Bathing assist Assist Level: Contact Guard/Touching assist     Upper Body Dressing/Undressing Upper body dressing   What is the patient wearing?: Pull over shirt    Upper body assist Assist Level: Supervision/Verbal cueing    Lower Body Dressing/Undressing Lower body dressing      What is the patient wearing?: Child psychotherapist)     Lower body assist       Toileting Toileting    Toileting assist Assist for toileting: Minimal Assistance - Patient > 75%     Transfers Chair/bed transfer  Transfers assist     Chair/bed transfer assist level: Contact Guard/Touching assist Chair/bed transfer assistive device: Armrests, Programmer, multimedia   Ambulation assist      Assist level: Contact Guard/Touching assist Assistive device: Walker-rolling Max distance: 60ft   Walk 10 feet activity   Assist     Assist level: Contact Guard/Touching assist Assistive device: Walker-rolling   Walk 50 feet activity   Assist  Assist level: Contact Guard/Touching assist Assistive device: Walker-rolling    Walk 150 feet activity   Assist Walk 150 feet activity did not occur: Safety/medical concerns         Walk 10 feet on uneven surface  activity   Assist Walk 10 feet on uneven surfaces activity did not occur: Safety/medical concerns         Wheelchair     Assist Will patient use wheelchair at discharge?: No(TBD but  anticipate pt will D/C at ambulatory level)             Wheelchair 50 feet with 2 turns activity    Assist            Wheelchair 150 feet activity     Assist          Blood pressure 109/67, pulse 67, temperature 98.6 F (37 C), temperature source Oral, resp. rate 18, height 5\' 7"  (1.702 m), weight 130 kg, last menstrual period 11/21/2011, SpO2 100 %.  Medical Problem List and Plan: 1.Functional and mobility deficitssecondary to multiple medical, bilateral LE wound infections s/p I&D with vac PT, OT CIR Evals       2  DVT prophylaxis -DVT/anticoagulation:Pharmaceutical:Lovenox -antiplatelet therapy: N/A 3. Pain Management:Controlled withOxycodone prn. 4. Mood:LCSW to follow for evaluation and support. -antipsychotic agents: N/A 5. Neuropsych: This patientiscapable of making decisions on herown behalf. 6. Skin/Wound Care:Continue wound VAC for at least a weekthen transition to [place] at d/c?. 7. Fluids/Electrolytes/Nutrition:Monitor I/O. Check lytes in am.  8. NICM s/p ICD: Continue Coreg bid.  9. Acute on chronic anemia: Add iron supplement. Dropped to 7.4 but not symptomatic hold off on transfusion  9/5- up to 7.7 today- again, walked with no orthostasis, so will wait on transfusion.  10. Wound infection: On Cipro for 28 more days.  11. Leucocytosis: Monitor for resolution. Monitor for fevers and other signs of infection.  12. Acute on chronic renal failure: Baseline SCR 1.3.   9/5- Cr 1.07 and BUN 14 13. Prediabetes: Hgb A1c- 5.8--CM diet. 14. Hypokalemia- 9/5- K+ 3.2- will replete in addition to daily 20 mEq and recheck Monday.    LOS: 2 days A FACE TO FACE EVALUATION WAS PERFORMED  Aliea Bobe 08/29/2019, 10:20 AM

## 2019-08-29 NOTE — Progress Notes (Signed)
Physical Therapy Session Note  Patient Details  Name: Kari [name] MRN: [id number] Date of Birth: June 18, 1961  Today's Date: 08/29/2019 PT Individual Time: 0800-0904 and 1308-1406 PT Individual Time Calculation (min): 64 min and 58 min  Short Term Goals: Week 1:  PT Short Term Goal 1 (Week 1): = to LTGs based on ELOS  Skilled Therapeutic Interventions/Progress Updates:    Session 1: Pt received supine in bed finishing breakfast and agreeable to therapy session. Supine>sit R EOB, HOB partially elevated and using bedrails, with supervision and increased time. Sit>stand EOB>RW with increased time but able to perform with CGA. Ambulated ~62ft to w/c using RW with CGA for steadying.  Transported to/from gym in w/c. Ambulated 178ft, ~57ft (seated break between) using RW with CGA/close supervision for steadying/safety - continues to demonstrate decreased gait speed. Ascended/descended 4 steps using bilateral handrails for the first and final step but otherwise using B UE support on L HR to replicate home set-up with min assist for balance/lfting when using only L HR - cuing for sequencing. Ascended/descended 4 steps again using L HR only the entire time with lateral step-to pattern and progressed to only CGA for steadying. Stand pivot chair>w/c using RW with CGA for steadying. Transported back to room in w/c. Ambulated ~73ft w/c>recliner using RW with CGA for steadying. Stand>sit RW>recliner with pt demonstrating poor eccentric control with min assist for lowering. Pt left seated in recliner with needs in reach, chair alarm on, and lines intact.  Session 2: Pt received sitting in recliner and reporting pain in B LEs requesting to stay in room but with encouragement agreeable to participate in therapy session. Sit<>stands using RW with close supervision for safety throughout session. Ambulated ~134ft using RW to therapy gym with close supervision for safety - continues to ambulate at decreased gait speed  with decreased B LE step length and foot clearance. Reports she is very tired stating "yall done worn me out today."  Performed the following seated LE exercises: - long arc quads 2x15 reps each with cuing for full knee extension - ankle DF/PF within available range due to the koban  Encouraged pt to participate in standing LE exercises but pt deferred standing at this time. Performed seated B UE exercises of bicep curls and overhead presses using 3lb dumbell weights as well as seated scapular retraction using level 2 theraband resistance all 2 sets of 15 with cuing for proper form/technique. Ambulated ~195ft using RW back to room with close supervision for safety. Pt left seated in recliner with needs in reach, chair alarm on, and lines intact.  Therapy Documentation Precautions:  Precautions Precautions: Fall, Other (comment) Precaution Comments: BLE wound vacs Restrictions Weight Bearing Restrictions: No RLE Weight Bearing: Weight bearing as tolerated LLE Weight Bearing: Weight bearing as tolerated  Pain: Session 1: Reports pain level of 7/10 with RN notified and present for medication administration - provided seated rest breaks and repositioning as needed throughout session for pain management.   Session 2: Reports pain level of 8/10 in B LEs; however, states RN provided medication prior to therapist arrival - modified treatment interventions and provided seated rest breaks as needed for pain management.   Therapy/Group: Individual Therapy  Tawana Scale, PT, DPT 08/29/2019, 7:50 AM

## 2019-08-29 NOTE — Progress Notes (Signed)
Occupational Therapy Session Note  Patient Details  Name: Kari [name] MRN: [id number] Date of Birth: 03/08/1961  Today's Date: 08/29/2019 OT Individual Time: 1050-1200 OT Individual Time Calculation (min): 70 min    Short Term Goals: Week 1:  OT Short Term Goal 1 (Week 1): STGs equal to LTGs set at modifeid independent overall based on ELOS.  Skilled Therapeutic Interventions/Progress Updates:    1:1 Self care retraining at chair level. Pt sat in recliner and bathed and changed dress with seutp of supplies and min guard for sit to stands from recliner. Pt with soreness around the top of her bandages when washing iodine.   Pt participated in functional ambulation around unit for endurance and strength training. Pt ambulated 316 feet with two seated rest breaks (one being on the toilet in the dayroom). Pt able to perform toileting and toilet transfer with supervision. Pt also demonstrated functional ambulation with supervision.  Back in room perform LE exercises including marching in place (seated), hip adduction/ abduction, straight Leg raises alternating and then together to incorporate core.   Left sitting up in the recliner.   Therapy Documentation Precautions:  Precautions Precautions: Fall, Other (comment) Precaution Comments: BLE wound vacs Restrictions Weight Bearing Restrictions: No RLE Weight Bearing: Weight bearing as tolerated LLE Weight Bearing: Weight bearing as tolerated Pain: Mild discomfort in LEs and requested meds at end of session.   Therapy/Group: Individual Therapy  Willeen Cass [facility] 08/29/2019, 3:29 PM

## 2019-08-30 MED ORDER — SENNOSIDES-DOCUSATE SODIUM 8.6-50 MG PO TABS
2.0000 | ORAL_TABLET | Freq: Two times a day (BID) | ORAL | Status: DC
  Administered 2019-08-30 – 2019-09-03 (×9): 2 via ORAL
  Filled 2019-08-30 (×9): qty 2

## 2019-08-30 MED ORDER — SORBITOL 70 % SOLN: 30.0000 mL | Freq: Every day | Status: DC | PRN

## 2019-08-30 NOTE — IPOC Note (Signed)
Overall Plan of Care [facility]) Patient Details Name: RIELYN KRUPINSKI MRN: [id number] DOB: [birth year]  Admitting Diagnosis: Physical debility  Hospital Problems: Principal Problem:   Physical debility Active Problems:   Morbid obesity ([place])   Debility     Functional Problem List: Nursing    PT Balance, Edema, Safety, Motor, Nutrition, Pain, Skin Integrity, Endurance  OT Balance, Endurance, Pain  SLP    TR         Basic ADL's: OT Grooming, Bathing, Dressing, Toileting     Advanced  ADL's: OT       Transfers: PT Bed Mobility, Bed to Chair, Car, Chief Operating Officer: PT Ambulation, Emergency planning/management officer, Stairs     Additional Impairments: OT    SLP        TR      Anticipated Outcomes Item Anticipated Outcome  Self Feeding Independent  Swallowing      Basic self-care  Modified independent  Toileting  Modified independent   Bathroom Transfers Modified independent  Bowel/Bladder     Transfers  mod-I with LRAD  Locomotion  mod-I with LRAD  Communication     Cognition     Pain     Safety/Judgment      Therapy Plan: PT Intensity: Minimum of 1-2 x/day ,45 to 90 minutes PT Frequency: 5 out of 7 days PT Duration Estimated Length of Stay: ~7 days OT Intensity: Minimum of 1-2 x/day, 45 to 90 minutes OT Frequency: 5 out of 7 days OT Duration/Estimated Length of Stay: 5-7 days     Due to the current state of emergency, patients may not be receiving their 3-hours of Medicare-mandated therapy.   Team Interventions: Nursing Interventions    PT interventions Ambulation/gait training, Discharge planning, Functional mobility training, Psychosocial support, Therapeutic Activities, Balance/vestibular training, Disease management/prevention, Neuromuscular re-education, Skin care/wound management, Therapeutic Exercise, Wheelchair propulsion/positioning, DME/adaptive equipment instruction, Pain management, Splinting/orthotics, UE/LE Strength  taining/ROM, Community reintegration, Technical sales engineer stimulation, Patient/family education, IT trainer, UE/LE Coordination activities  OT Interventions Training and development officer, Discharge planning, Disease mangement/prevention, DME/adaptive equipment instruction, Functional mobility training, Pain management, Patient/family education, Psychosocial support, Self Care/advanced ADL retraining, Skin care/wound managment, Therapeutic Activities, Therapeutic Exercise, Community reintegration, UE/LE Coordination activities, UE/LE Strength taining/ROM, Wheelchair propulsion/positioning, Neuromuscular re-education  SLP Interventions    TR Interventions    SW/CM Interventions     Barriers to Discharge MD  Medical stability, IV antibiotics, Neurogenic bowel and bladder, Lack of/limited family support and Weight  Nursing      PT Home environment access/layout, Wound Care, Inaccessible home environment    OT Inaccessible home environment upstairs bedroom  SLP      SW       Team Discharge Planning: Destination: PT-Home ,OT- Home , SLP-  Projected Follow-up: PT-Home health PT, 24 hour supervision/assistance, OT-  Home health OT, SLP-  Projected Equipment Needs: PT-To be determined, Other (comment), OT- To be determined, SLP-  Equipment Details: PT-pt has SPC, RW, rollator, and BSC, OT-  Patient/family involved in discharge planning: PT- Patient,  OT-Patient, SLP-   MD ELOS: 5-7 days Medical Rehab Prognosis:  Good Assessment: Pt is a 58 yr old female with debility due to B/L LE wounds/infections- on IV ABX- prolonged- Fortaz, as well as acute on chronic anemia, malnutrition, based on Albumin of 1.9, Acute on chronic renal failure, severe constipation and hypokalemia s/p repletion- She also has insulin resistance/prediabetes-   Her goals are Mod I with equipment   See Team Conference Notes  for weekly updates to the plan of care

## 2019-08-30 NOTE — Progress Notes (Addendum)
[place] PHYSICAL MEDICINE & REHABILITATION PROGRESS NOTE   Subjective/Complaints:  Pt reports no issues except is worn out for doing 3 sessions of therapy yesterday- slept "like a rock" and still fatigued.  Per chart, has occult stool test- not done yet. Based on chart review, no BM since at least 9/3- will give sorbitol vs Miralax dose, added Senokot-S 2 tabs BID and then suggest a suppository if hasn't gone by evening.   ROS- neg CP, SOB, N/V/D  Objective:   No results found. Recent Labs    08/28/19 0509 08/29/19 0655  WBC 10.8* 7.6  HGB 7.4* 7.7*  HCT 24.9* 26.9*  PLT 256 244   Recent Labs    08/28/19 0509  NA 139  K 3.2*  CL 106  CO2 26  GLUCOSE 104*  BUN 14  CREATININE 1.07*  CALCIUM 8.6*    Intake/Output Summary (Last 24 hours) at 08/30/2019 1159 Last data filed at 08/30/2019 0900 Gross per 24 hour  Intake 908.11 ml  Output -  Net 908.11 ml     Physical Exam: Vital Signs Blood pressure (!) 100/55, pulse 70, temperature (!) 97.4 F (36.3 C), temperature source Oral, resp. rate 18, height 5\' 7"  (1.702 m), weight 130 kg, last menstrual period 11/21/2011, SpO2 100 %.  General: labs and vitals reviewed Awake, alert, appropriate, sitting up slightly in bed; sleepy; NAD Mood and affect are appropriate Heart: no JVD seen; RRR Lungs: Clear to auscultation B/L Abdomen: hypoactive BS, soft, NT, slightly distended vs protuberant Extremities: No clubbing, cyanosis, or edema Skin: ACE wraps B legs with wound vacs Neurologic: Cranial nerves II through XII intact, motor strength is 5/5 in bilateral deltoid, bicep, tricep, grip,4- hip flexor, knee extensors, ankle dorsiflexor and plantar flexor Sensory exam normal sensation to light touch and proprioception in bilateral upper and lower extremities Cerebellar exam normal finger to nose to finger as well as heel to shin in bilateral upper and lower extremities Musculoskeletal: Full range of motion in all 4  extremities. No joint swelling    Assessment/Plan: 1. Functional deficits secondary to debility  which require 3+ hours per day of interdisciplinary therapy in a comprehensive inpatient rehab setting.  Physiatrist is providing close team supervision and 24 hour management of active medical problems listed below.  Physiatrist and rehab team continue to assess barriers to discharge/monitor patient progress toward functional and medical goals  Care Tool:  Bathing    Body parts bathed by patient: Right arm, Left arm, Chest, Abdomen, Face, Front perineal area, Buttocks, Right upper leg, Left upper leg     Body parts n/a: Right lower leg, Left lower leg   Bathing assist Assist Level: Contact Guard/Touching assist     Upper Body Dressing/Undressing Upper body dressing   What is the patient wearing?: Pull over shirt(night gown)    Upper body assist Assist Level: Supervision/Verbal cueing    Lower Body Dressing/Undressing Lower body dressing    Lower body dressing activity did not occur: N/A What is the patient wearing?: Child psychotherapist)     Lower body assist       Toileting Toileting    Toileting assist Assist for toileting: Moderate Assistance - Patient 50 - 74%     Transfers Chair/bed transfer  Transfers assist     Chair/bed transfer assist level: Moderate Assistance - Patient 50 - 74% Chair/bed transfer assistive device: Armrests, Programmer, multimedia   Ambulation assist      Assist level: Supervision/Verbal cueing Assistive device: Walker-rolling  Max distance: 111ft   Walk 10 feet activity   Assist     Assist level: Supervision/Verbal cueing Assistive device: Walker-rolling   Walk 50 feet activity   Assist    Assist level: Supervision/Verbal cueing Assistive device: Walker-rolling    Walk 150 feet activity   Assist Walk 150 feet activity did not occur: Safety/medical concerns  Assist level: Supervision/Verbal  cueing Assistive device: Walker-rolling    Walk 10 feet on uneven surface  activity   Assist Walk 10 feet on uneven surfaces activity did not occur: Safety/medical concerns         Wheelchair     Assist Will patient use wheelchair at discharge?: No(TBD but anticipate pt will D/C at ambulatory level)             Wheelchair 50 feet with 2 turns activity    Assist            Wheelchair 150 feet activity     Assist          Blood pressure (!) 100/55, pulse 70, temperature (!) 97.4 F (36.3 C), temperature source Oral, resp. rate 18, height 5\' 7"  (1.702 m), weight 130 kg, last menstrual period 11/21/2011, SpO2 100 %.  Medical Problem List and Plan: 1.Functional and mobility deficitssecondary to multiple medical, bilateral LE wound infections s/p I&D with vac PT, OT CIR Evals       2  DVT prophylaxis -DVT/anticoagulation:Pharmaceutical:Lovenox -antiplatelet therapy: N/A 3. Pain Management:Controlled withOxycodone prn. 4. Mood:LCSW to follow for evaluation and support. -antipsychotic agents: N/A 5. Neuropsych: This patientiscapable of making decisions on herown behalf. 6. Skin/Wound Care:Continue wound VAC for at least a weekthen transition to [place] at d/c?. 7. Fluids/Electrolytes/Nutrition:Monitor I/O. Check lytes in am.  8. NICM s/p ICD: Continue Coreg bid.  9. Acute on chronic anemia: Add iron supplement. Dropped to 7.4 but not symptomatic hold off on transfusion  9/5- up to 7.7 today- again, walked with no orthostasis, so will wait on transfusion.   9/6- can't do occult stool until has BM- ordered meds as below 10. Wound infection: On Fortaz for 28 more days.   9/6- of note, pt's albumin really low at 1.9- will check prealbumin in AM 11. Leucocytosis: Monitor for resolution. Monitor for fevers and other signs of infection.  12. Acute on chronic renal failure: Baseline SCR 1.3.   9/5- Cr 1.07  and BUN 14 13. Prediabetes: Hgb A1c- 5.8--CM diet. 14. Hypokalemia- 9/5- K+ 3.2- will replete in addition to daily 20 mEq and recheck Monday. 15/ Constipation  9/6- will d/c colace, add senokot-S 2 tabs BID; then have nursing give either additional miralax or sorbitol prn that I ordered- if doesn't go by evening, needs suppository- hopefully will go today. -can't do occult blood card until has BM.    LOS: 3 days A FACE TO FACE EVALUATION WAS PERFORMED  Kari [name] 08/30/2019, 11:59 AM

## 2019-08-31 ENCOUNTER — Inpatient Hospital Stay (HOSPITAL_COMMUNITY): Payer: Medicare Other | Admitting: Physical Therapy

## 2019-08-31 ENCOUNTER — Inpatient Hospital Stay (HOSPITAL_COMMUNITY): Payer: Medicare Other | Admitting: Occupational Therapy

## 2019-08-31 LAB — BASIC METABOLIC PANEL
Anion gap: 7 (ref 5–15)
BUN: 9 mg/dL (ref 6–20)
CO2: 27 mmol/L (ref 22–32)
Calcium: 8.6 mg/dL — ABNORMAL LOW (ref 8.9–10.3)
Chloride: 105 mmol/L (ref 98–111)
Creatinine, Ser: 0.9 mg/dL (ref 0.44–1.00)
GFR calc Af Amer: >60 mL/min (ref >60)
GFR calc non Af Amer: >60 mL/min (ref >60)
Glucose, Bld: 112 mg/dL — ABNORMAL HIGH (ref 70–99)
Potassium: 3.7 mmol/L (ref 3.5–5.1)
Sodium: 139 mmol/L (ref 135–145)

## 2019-08-31 LAB — CBC WITH DIFFERENTIAL/PLATELET
Abs Immature Granulocytes: 0.02 10*3/uL (ref 0.00–0.07)
Basophils Absolute: 0 10*3/uL (ref 0.0–0.1)
Basophils Relative: 1 %
Eosinophils Absolute: 0.2 10*3/uL (ref 0.0–0.5)
Eosinophils Relative: 4 %
HCT: 27.3 % — ABNORMAL LOW (ref 36.0–46.0)
Hemoglobin: 8.2 g/dL — ABNORMAL LOW (ref 12.0–15.0)
Immature Granulocytes: 0 %
Lymphocytes Relative: 22 %
Lymphs Abs: 1.2 10*3/uL (ref 0.7–4.0)
MCH: 28.8 pg (ref 26.0–34.0)
MCHC: 30 g/dL (ref 30.0–36.0)
MCV: 95.8 fL (ref 80.0–100.0)
Monocytes Absolute: 0.6 10*3/uL (ref 0.1–1.0)
Monocytes Relative: 10 %
Neutro Abs: 3.5 10*3/uL (ref 1.7–7.7)
Neutrophils Relative %: 63 %
Platelets: 237 10*3/uL (ref 150–400)
RBC: 2.85 MIL/uL — ABNORMAL LOW (ref 3.87–5.11)
RDW: 18.4 % — ABNORMAL HIGH (ref 11.5–15.5)
WBC: 5.5 10*3/uL (ref 4.0–10.5)
nRBC: 0 % (ref 0.0–0.2)

## 2019-08-31 LAB — PREALBUMIN: Prealbumin: 19.7 mg/dL (ref 18–38)

## 2019-08-31 LAB — OCCULT BLOOD X 1 CARD TO LAB, STOOL: Fecal Occult Bld: NEGATIVE

## 2019-08-31 NOTE — Progress Notes (Signed)
Occupational Therapy Session Note  Patient Details  Name: SANJNA HASKEW MRN: [id number] Date of Birth: 12-13-1961  Today's Date: 08/31/2019 OT Individual Time: 5859-2924 OT Individual Time Calculation (min): 58 min    Short Term Goals: Week 1:  OT Short Term Goal 1 (Week 1): STGs equal to LTGs set at modifeid independent overall based on ELOS.  Skilled Therapeutic Interventions/Progress Updates:    Pt completed bathing and dressing sit to stand from the bedside chair this session.  She initially completed supine to sit with supervision and then over to the bedside toilet with supervision using the RW for support.  Supervision for completion of toilet hygiene and clothing management in sitting with lateral leans.  She then completed all UB bathing and LB bathing with setup.  Finished session with transport down to the laundry room via wheelchair for washing clothes.  Pt able to complete sit to stand with supervision and short distance mobility to and from the RW for transport with supervision and therapist carrying bilateral wound vacs.  Pt left in wheelchair at end of session with setup of supplies and chair alarm in place.    Therapy Documentation Precautions:  Precautions Precautions: Fall, Other (comment) Precaution Comments: BLE wound vacs Restrictions Weight Bearing Restrictions: No RLE Weight Bearing: Weight bearing as tolerated LLE Weight Bearing: Weight bearing as tolerated  Pain: Pain Assessment Pain Scale: 0-10 Pain Score: 0-No pain Faces Pain Scale: No hurt ADL: See Care Tool Section for some details of ADL  Therapy/Group: Individual Therapy  Fe Okubo OTR/L 08/31/2019, 8:59 AM

## 2019-08-31 NOTE — Progress Notes (Signed)
Physical Therapy Session Note  Patient Details  Name: Kari [name] MRN: [id number] Date of Birth: [birth year]  Today's Date: 08/31/2019 PT Individual Time: 1000-1055 and 1415-1525 PT Individual Time Calculation (min): 55 min and 70 min  Short Term Goals: Week 1:  PT Short Term Goal 1 (Week 1): = to LTGs based on ELOS  Skilled Therapeutic Interventions/Progress Updates: Tx1:  Pt presented in recliner agreeable to therapy. Pt stating pain in B legs 8/10 nsg advised and pain meds administered during session. Pt ambulated across room to w/c supervision for transfer and ambulation and pt transported to day room and participated in seated and standing therex as noted below all activities performed bilaterally and to fatigue (approx 20-25 ea)  Standing; SLR Hip abd/add Marching Hamstring curls Alternating toe taps to 4in step  Seated: Hip flexion LAQ Hip abd/add  Pt ambulated back to room with RW and supervision 159f with x 1 standing break to obtain clothes from dryer (PTA pulled clothes from dryer). Pt returned to recliner at end of session and left with call bell within reach, seat alarm on, and needs met.   Tx2: Pt presented in recliner agreeable to therapy. Nsg present to set up IV for antibiotics. Nsg agreeable to hold IV for 20 min to perform stairs. Pt ambulated to w/c in room approx 10 ft with supervision and PTA assisting with wound vac lines. Pt transported to rehab gym for time management. Performed ascending/descending x 8 steps with B handrails for endurance with supervision. Pt returned to room for IV set up and returned to rehab gym with IV. Participated in seated UE activities for endurance including hitting small ball with racquet and zoom ball all performed to fatigue (2 bout of 5 min for racquet ball and 2 min increments for zoom ball). Pt also performed standing tolerance playing horseshoes to fatigue (approx 4 min). Pt ambulated approx 2047fwith RW and supervision back  to room. Pt transferred to recliner and left with seat alarm on, call bell within reach and needs met.       Therapy Documentation Precautions:  Precautions Precautions: Fall, Other (comment) Precaution Comments: BLE wound vacs Restrictions Weight Bearing Restrictions: No RLE Weight Bearing: Weight bearing as tolerated LLE Weight Bearing: Weight bearing as tolerated General:   Vital Signs:   Pain: Pain Assessment Pain Scale: 0-10 Pain Score: 7  Faces Pain Scale: No hurt Pain Type: Acute pain Pain Location: Leg Pain Orientation: Right;Left Pain Descriptors / Indicators: Aching Pain Frequency: Intermittent Pain Onset: On-going Pain Intervention(s): Medication (See eMAR)    Therapy/Group: Individual Therapy  Barbette Mcglaun  [place] Rommel, PTA  08/31/2019, 10:59 AM

## 2019-08-31 NOTE — Progress Notes (Signed)
[place] PHYSICAL MEDICINE & REHABILITATION PROGRESS NOTE   Subjective/Complaints:   No issues overnite  ROS- neg CP, SOB, N/V/D  Objective:   No results found. Recent Labs    08/29/19 0655 08/31/19 0710  WBC 7.6 5.5  HGB 7.7* 8.2*  HCT 26.9* 27.3*  PLT 244 237   Recent Labs    08/31/19 0710  NA 139  K 3.7  CL 105  CO2 27  GLUCOSE 112*  BUN 9  CREATININE 0.90  CALCIUM 8.6*    Intake/Output Summary (Last 24 hours) at 08/31/2019 0925 Last data filed at 08/30/2019 2300 Gross per 24 hour  Intake 560 ml  Output -  Net 560 ml     Physical Exam: Vital Signs Blood pressure 118/81, pulse 69, temperature 98.3 F (36.8 C), temperature source Oral, resp. rate 19, height 5\' 7"  (1.702 m), weight 130 kg, last menstrual period 11/21/2011, SpO2 100 %.  General: labs and vitals reviewed Awake, alert, appropriate, sitting up slightly in bed; sleepy; NAD Mood and affect are appropriate Heart: no JVD seen; RRR Lungs: Clear to auscultation B/L Abdomen: hypoactive BS, soft, NT, slightly distended vs protuberant Extremities: No clubbing, cyanosis, or edema Skin: ACE wraps B legs with wound vacs Neurologic: Cranial nerves II through XII intact, motor strength is 5/5 in bilateral deltoid, bicep, tricep, grip,4- hip flexor, knee extensors, ankle dorsiflexor and plantar flexor Sensory exam normal sensation to light touch and proprioception in bilateral upper and lower extremities Cerebellar exam normal finger to nose to finger as well as heel to shin in bilateral upper and lower extremities Musculoskeletal: Full range of motion in all 4 extremities. No joint swelling    Assessment/Plan: 1. Functional deficits secondary to debility  which require 3+ hours per day of interdisciplinary therapy in a comprehensive inpatient rehab setting.  Physiatrist is providing close team supervision and 24 hour management of active medical problems listed below.  Physiatrist and rehab team  continue to assess barriers to discharge/monitor patient progress toward functional and medical goals  Care Tool:  Bathing    Body parts bathed by patient: Right arm, Left arm, Chest, Abdomen, Face, Front perineal area, Buttocks, Right upper leg, Left upper leg     Body parts n/a: Right lower leg, Left lower leg   Bathing assist Assist Level: Supervision/Verbal cueing     Upper Body Dressing/Undressing Upper body dressing   What is the patient wearing?: Pull over shirt    Upper body assist Assist Level: Supervision/Verbal cueing    Lower Body Dressing/Undressing Lower body dressing    Lower body dressing activity did not occur: N/A What is the patient wearing?: Child psychotherapist)     Lower body assist       Toileting Toileting    Toileting assist Assist for toileting: Supervision/Verbal cueing     Transfers Chair/bed transfer  Transfers assist     Chair/bed transfer assist level: Supervision/Verbal cueing Chair/bed transfer assistive device: Programmer, multimedia   Ambulation assist      Assist level: Supervision/Verbal cueing Assistive device: Walker-rolling Max distance: 8'   Walk 10 feet activity   Assist     Assist level: Supervision/Verbal cueing Assistive device: Walker-rolling   Walk 50 feet activity   Assist    Assist level: Supervision/Verbal cueing Assistive device: Walker-rolling    Walk 150 feet activity   Assist Walk 150 feet activity did not occur: Safety/medical concerns  Assist level: Supervision/Verbal cueing Assistive device: Walker-rolling    Walk 10 feet  on uneven surface  activity   Assist Walk 10 feet on uneven surfaces activity did not occur: Safety/medical concerns         Wheelchair     Assist Will patient use wheelchair at discharge?: No(TBD but anticipate pt will D/C at ambulatory level)             Wheelchair 50 feet with 2 turns activity    Assist             Wheelchair 150 feet activity     Assist          Blood pressure 118/81, pulse 69, temperature 98.3 F (36.8 C), temperature source Oral, resp. rate 19, height 5\' 7"  (1.702 m), weight 130 kg, last menstrual period 11/21/2011, SpO2 100 %.  Medical Problem List and Plan: 1.Functional and mobility deficitssecondary to multiple medical, bilateral LE wound infections s/p I&D with vac PT, OT CIR    2  DVT prophylaxis -DVT/anticoagulation:Pharmaceutical:Lovenox -antiplatelet therapy: pt reports being on ASA daily at home "for my heart" - await stool guaic if neg may resume 81mg  3. Pain Management:Controlled withOxycodone prn. 4. Mood:LCSW to follow for evaluation and support. -antipsychotic agents: N/A 5. Neuropsych: This patientiscapable of making decisions on herown behalf. 6. Skin/Wound Care:Continue wound VAC for at least a weekthen transition to [place] at d/c?. 7. Fluids/Electrolytes/Nutrition:Monitor I/O. Check lytes in am.  8. NICM s/p ICD: Continue Coreg bid.  9. Acute on chronic anemia: Add iron supplement. Dropped to 7.4 but not symptomatic hold off on transfusion  9/5- up to 7.7 , 8.2 on 9/7    Finally had BM after laxatives await guaic 10. Wound infection: On Fortaz for 28 more days.   9/6- of note, pt's albumin really low at 1.9- will check prealbumin in AM 11. Leucocytosis: Monitor for resolution. Monitor for fevers and other signs of infection.  12. Acute on chronic renal failure: Baseline SCR 1.3.   9/5- Cr 1.07 and BUN 14 13. Prediabetes: Hgb A1c- 5.8--CM diet. 14. Hypokalemia- 9/5- K+ 3.2- will replete in addition to daily 20 mEq and recheck Monday. 15/ Constipation  9/6- will d/c colace, add senokot-S 2 tabs BID; then have nursing give either additional miralax or sorbitol prn that I ordered- if doesn't go by evening, needs suppository- hopefully will go today. -can't do occult blood card until has BM.     LOS: 4 days A FACE TO FACE EVALUATION WAS PERFORMED  Charlett Blake 08/31/2019, 9:25 AM

## 2019-09-01 ENCOUNTER — Inpatient Hospital Stay (HOSPITAL_COMMUNITY): Payer: Medicare Other | Admitting: Occupational Therapy

## 2019-09-01 ENCOUNTER — Inpatient Hospital Stay (HOSPITAL_COMMUNITY): Payer: Medicare Other

## 2019-09-01 ENCOUNTER — Telehealth: Payer: Self-pay

## 2019-09-01 ENCOUNTER — Inpatient Hospital Stay (HOSPITAL_COMMUNITY): Payer: Medicare Other | Admitting: Physical Therapy

## 2019-09-01 NOTE — Progress Notes (Signed)
Social Work Patient ID: Kari [name], female   DOB: July 14, 1961, 59 y.o.   MRN: [id number]  Team feels pt will be ready to discharge Thursday and MD agreeable to this. Pam-PA aware and has touched base with Dr. Sharol Given regarding wound vac's. Will work on discharge needs.

## 2019-09-01 NOTE — Progress Notes (Signed)
Occupational Therapy Session Note  Patient Details  Name: Kari [name] MRN: [id number] Date of Birth: Sep 04, 1961  Today's Date: 09/01/2019 OT Individual Time: 6160-7371 OT Individual Time Calculation (min): 55 min    Short Term Goals: Week 1:  OT Short Term Goal 1 (Week 1): STGs equal to LTGs set at modifeid independent overall based on ELOS.  Skilled Therapeutic Interventions/Progress Updates:    Pt transferred from supine to sit EOB with supervision.  She then worked on bathing from sitting on the EOB with lateral leans for washing peri area with supervision.  She was able to donn a pullover shirt/gown with setup as well as slip on slippers.  Supervision for stand pivot transfer to the bedside recliner using the RW for support.  Next had pt work on BUE therex exercises in sitting.  She was able to complete 2 sets of 15 reps for shoulder horizontal abduction as well as triceps extension bilaterally, she then completed 2 sets of 10 reps for bilateral shoulder flexion.  She finished exercises with 2 sets of 15 reps for seated row with arms abducted.  Pt left in the bedside recliner with call button and phone in reach and chair alarm in place.   Therapy Documentation Precautions:  Precautions Precautions: Fall, Other (comment) Precaution Comments: BLE wound vacs Restrictions Weight Bearing Restrictions: No RLE Weight Bearing: Weight bearing as tolerated LLE Weight Bearing: Weight bearing as tolerated   Pain: Pain Assessment Pain Scale: Faces Pain Score: 0-No pain Faces Pain Scale: No hurt ADL: See Care Tool Section for some details of ADL and mobility  Therapy/Group: Individual Therapy  Annsley Akkerman OTR/L 09/01/2019, 8:55 AM

## 2019-09-01 NOTE — Telephone Encounter (Signed)
I called and sw Olin Hauser PA at [facility] to advise of message below. Will call with questions.

## 2019-09-01 NOTE — Progress Notes (Signed)
Physical Therapy Session Note  Patient Details  Name: Kari [name] MRN: [id number] Date of Birth: 11-Jun-1961  Today's Date: 09/01/2019 PT Individual Time: 4166-0630 and 1601-0932 PT Individual Time Calculation (min): 10 min and 48 min   Short Term Goals: Week 1:  PT Short Term Goal 1 (Week 1): = to LTGs based on ELOS  Skilled Therapeutic Interventions/Progress Updates:    Pt received sitting in recliner with her lunch tray arriving upon therapist arrival and pt requesting time to eat prior to therapy session. Therapist educated pt on discharge plan and updated ELOS as well as plan for recommended follow-up - pt verbalized understanding. Therapist then left to return after pt finished meal.   Upon therapist return pt still sitting in recliner and now agreeable to therapy session. Pt deferring ambulating to therapy gym despite encouragement reporting she is tired. Sit<>stands using RW with supervision for safety throughout session. Ambulated ~93ft to w/c using RW with supervision for safety. Transported to/from therapy gym in w/c for energy conservation. Ascended/descended 8 steps using L HR only to replicate home environment with CGA/close supervision for steadying/safety - pt demonstrated proper stepping technique without cuing.  Performed the following exercises and provided pt printed HEP to perform at home: - seated long arc quads x20 reps each LE - seated ankle DF/PF with emphasis on increased ROM within unna boots x20 reps each - repeated sit<>stands from chair with arm rests x8 reps Cuing throughout for proper form/technique of exercises. Transported back to room in w/c. Ambulated ~39ft w/c>EOB using RW with supervision. Sit>supine with supervision and HOB flat but pt relying heavily on bedrails and increased time for B LE management. Pt left supine in bed with needs in reach, lines intact, and bed alarm on.  Therapist provided assist for B LE wound vac line management during  functional mobility throughout session.   Therapy Documentation Precautions:  Precautions Precautions: Fall, Other (comment) Precaution Comments: BLE wound vacs Restrictions Weight Bearing Restrictions: No RLE Weight Bearing: Weight bearing as tolerated LLE Weight Bearing: Weight bearing as tolerated  Pain: Reports pain level of 7-8/10 in B LEs - provided seated rest breaks as needed throughout session and pt deferring pain medication at this time.   Therapy/Group: Individual Therapy  Tawana Scale, PT, DPT 09/01/2019, 7:58 AM

## 2019-09-01 NOTE — Progress Notes (Signed)
[name]   Subjective/Complaints:   No issues overnite, per OT should be ready for d/c soon.  Per pt , Dr Sharol Given told her she will have mini wound vacs at home.   Discussed stool OB result   ROS- neg CP, SOB, N/V/D  Objective:   No results found. Recent Labs    08/31/19 0710  WBC 5.5  HGB 8.2*  HCT 27.3*  PLT 237   Recent Labs    08/31/19 0710  NA 139  K 3.7  CL 105  CO2 27  GLUCOSE 112*  BUN 9  CREATININE 0.90  CALCIUM 8.6*    Intake/Output Summary (Last 24 hours) at 09/01/2019 0903 Last data filed at 08/31/2019 1900 Gross per 24 hour  Intake 840 ml  Output -  Net 840 ml     Physical Exam: Vital Signs Blood pressure 115/64, pulse 76, temperature 98.6 F (37 C), temperature source Oral, resp. rate 16, height 5\' 7"  (1.702 m), weight 130 kg, last menstrual period 11/21/2011, SpO2 99 %.  General: labs and vitals reviewed Awake, alert, appropriate, sitting up slightly in bed; sleepy; NAD Mood and affect are appropriate Heart: no JVD seen; RRR Lungs: Clear to auscultation B/L Abdomen: hypoactive BS, soft, NT, slightly distended vs protuberant Extremities: No clubbing, cyanosis, or edema Skin: ACE wraps B legs with wound vacs Neurologic: Cranial nerves II through XII intact, motor strength is 5/5 in bilateral deltoid, bicep, tricep, grip,4- hip flexor, knee extensors, ankle dorsiflexor and plantar flexor Sensory exam normal sensation to light touch and proprioception in bilateral upper and lower extremities Cerebellar exam normal finger to nose to finger as well as heel to shin in bilateral upper and lower extremities Musculoskeletal: Full range of motion in all 4 extremities. No joint swelling    Assessment/Plan: 1. Functional deficits secondary to debility  which require 3+ hours per day of interdisciplinary therapy in a comprehensive inpatient rehab setting.  Physiatrist is providing close team supervision  and 24 hour management of active medical problems listed below.  Physiatrist and rehab team continue to assess barriers to discharge/monitor patient progress toward functional and medical goals  Care Tool:  Bathing    Body parts bathed by patient: Right arm, Left arm, Chest, Abdomen, Face, Front perineal area, Buttocks, Right upper leg, Left upper leg   Body parts bathed by helper: Right lower leg, Left lower leg Body parts n/a: Right lower leg, Left lower leg   Bathing assist Assist Level: Supervision/Verbal cueing     Upper Body Dressing/Undressing Upper body dressing   What is the patient wearing?: Pull over shirt    Upper body assist Assist Level: Set up assist    Lower Body Dressing/Undressing Lower body dressing    Lower body dressing activity did not occur: N/A What is the patient wearing?: Hospital gown only     Lower body assist       Toileting Toileting    Toileting assist Assist for toileting: Supervision/Verbal cueing     Transfers Chair/bed transfer  Transfers assist     Chair/bed transfer assist level: Supervision/Verbal cueing Chair/bed transfer assistive device: Programmer, multimedia   Ambulation assist      Assist level: Supervision/Verbal cueing Assistive device: Walker-rolling Max distance: 220ft   Walk 10 feet activity   Assist     Assist level: Supervision/Verbal cueing Assistive device: Walker-rolling   Walk 50 feet activity   Assist    Assist level: Supervision/Verbal cueing  Assistive device: Walker-rolling    Walk 150 feet activity   Assist Walk 150 feet activity did not occur: Safety/medical concerns  Assist level: Supervision/Verbal cueing Assistive device: Walker-rolling    Walk 10 feet on uneven surface  activity   Assist Walk 10 feet on uneven surfaces activity did not occur: Safety/medical concerns         Wheelchair     Assist Will patient use wheelchair at discharge?: No(TBD  but anticipate pt will D/C at ambulatory level)             Wheelchair 50 feet with 2 turns activity    Assist            Wheelchair 150 feet activity     Assist          Blood pressure 115/64, pulse 76, temperature 98.6 F (37 C), temperature source Oral, resp. rate 16, height 5\' 7"  (1.702 m), weight 130 kg, last menstrual period 11/21/2011, SpO2 99 %.  Medical Problem List and Plan: 1.Functional and mobility deficitssecondary to multiple medical, bilateral LE wound infections s/p I&D with vac PT, OT CIR- team conf in am  may be ready soon medically     2  DVT prophylaxis -DVT/anticoagulation:Pharmaceutical:Lovenox -antiplatelet therapy: pt reports being on ASA daily at home "for my heart" - await stool guaic if neg may resume 81mg  3. Pain Management:Controlled withOxycodone prn. 4. Mood:LCSW to follow for evaluation and support. -antipsychotic agents: N/A 5. Neuropsych: This patientiscapable of making decisions on herown behalf. 6. Skin/Wound Care:Continue wound VAC for at least a weekthen transition to [place] at d/c?. 7. Fluids/Electrolytes/Nutrition:Monitor I/O. Check lytes in am.  8. NICM s/p ICD: Continue Coreg bid.  9. Acute on chronic anemia: Add iron supplement. Dropped to 7.4 but not symptomatic hold off on transfusion  9/5- up to 7.7 , 8.2 on 9/7    OB neg may resume ASA  10. Wound infection: On IV Fortaz until ~10/3  9/6- of note, pt's albumin really low at 1.9- will check prealbumin in AM 11. Leucocytosis: Monitor for resolution. Monitor for fevers and other signs of infection.  12. Acute on chronic renal failure: Baseline SCR 1.3.   9/5- Cr 1.07 and BUN 14 13. Prediabetes: Hgb A1c- 5.8--CM diet. 14. Hypokalemia- 9/5- K+ 3.2- will replete in addition to daily 20 mEq and recheck Monday. 15/ Constipation  9/6- will d/c colace, add senokot-S 2 tabs BID; then have nursing give either additional  miralax or sorbitol prn that I ordered- if doesn't go by evening, needs suppository- hopefully will go today. -can't do occult blood card until has BM.    LOS: 5 days A FACE TO FACE EVALUATION WAS PERFORMED  Kari [name] 09/01/2019, 9:03 AM

## 2019-09-01 NOTE — Progress Notes (Signed)
Physical Therapy Session Note  Patient Details  Name: Kari [name] MRN: [id number] Date of Birth: [birth year]  Today's Date: 09/01/2019 PT Individual Time: 1003-1105 PT Individual Time Calculation (min): 62 min   Short Term Goals: Week 1:  PT Short Term Goal 1 (Week 1): = to LTGs based on ELOS  Skilled Therapeutic Interventions/Progress Updates:     Patient in recliner in room upon PT arrival. Patient alert and agreeable to PT session. Patient reported 8/10 B lateral LE pain during session, RN made aware and provided pain medicine during session. PT provided repositioning, rest breaks, and distraction as pain interventions throughout session.   Therapeutic Activity: Transfers: Patient performed sit to/from stand x3 and a toilet transfer x1 with supervision using a RW, PT managed wound Vacs during transfers. Provided verbal cues for reaching back to sit to control descent. Required supervision for peri-care and LB dressing during toileting. Provided patient with bariatric RW during session for increased width for improved fit for transfers and ambulation and adjusted to patient's height during session.   Gait Training:  Patient ambulated 15 feet x2 and ~250 feet x1 using RW with supervision with PT managing wound Vac lines. Ambulated with decreased gait speed, decreased step length, wide BOS, mild B lateral trunk lean, and mildly abducted gait. Provided verbal cues for decreased BOS.  Wheelchair Mobility:  Patient was transported in the w/c from her room to the ortho gym with total A during session for time management and energy conservation.   Therapeutic Exercise: Patient performed the following exercises in sitting with verbal and tactile cues for proper technique. -B hip internal rotation 2x10 -B LAQ 2x10 -B marching 2x10 -B hamstring stretch 2x30 seconds  Patient in recliner at end of session with breaks locked, chair alarm set, and all needs within reach.    Therapy  Documentation Precautions:  Precautions Precautions: Fall, Other (comment) Precaution Comments: BLE wound vacs Restrictions Weight Bearing Restrictions: No RLE Weight Bearing: Weight bearing as tolerated LLE Weight Bearing: Weight bearing as tolerated    Therapy/Group: Individual Therapy  Cathlin Buchan L Hershy Flenner PT, DPT  09/01/2019, 4:28 PM

## 2019-09-01 NOTE — Telephone Encounter (Signed)
Olin Hauser, [place] at [facility] like a call back concerning clarification on antibiotic and plans to send patient home with [facility] after 7 days.  Cb# is [phone].  Please advise.  Thank you.

## 2019-09-01 NOTE — Telephone Encounter (Signed)
Please see below and advise.

## 2019-09-01 NOTE — Telephone Encounter (Signed)
Discharge with the Praveena plus portable wound VAC pump and I will follow-up in the office in 1 week.  Show patient how to plug in the unit to maintain its charge.  The pumps are available on [address] and also available in the operating room.

## 2019-09-02 ENCOUNTER — Inpatient Hospital Stay (HOSPITAL_COMMUNITY): Payer: Medicare Other | Admitting: Occupational Therapy

## 2019-09-02 ENCOUNTER — Inpatient Hospital Stay (HOSPITAL_COMMUNITY): Payer: Medicare Other

## 2019-09-02 ENCOUNTER — Encounter (HOSPITAL_COMMUNITY): Payer: Self-pay | Admitting: Orthopedic Surgery

## 2019-09-02 MED ORDER — CEFTAZIDIME IV (FOR PTA / DISCHARGE USE ONLY): 1.0000 g | Freq: Three times a day (TID) | INTRAVENOUS | 0 refills | Status: AC

## 2019-09-02 MED ORDER — SODIUM CHLORIDE 0.9% FLUSH
10.0000 mL | Freq: Two times a day (BID) | INTRAVENOUS | Status: DC
  Administered 2019-09-02 – 2019-09-03 (×2): 10 mL

## 2019-09-02 NOTE — Progress Notes (Signed)
PHARMACY CONSULT NOTE FOR:  OUTPATIENT  PARENTERAL ANTIBIOTIC THERAPY (OPAT)  Indication:  Wound infection Regimen:  Ceftazidime 1 gm IV Q 8 hrs End date:  09/11/19  IV antibiotic discharge orders are pended. To discharging provider:  please sign these orders via discharge navigator,  Select New Orders & click on the button choice - Manage This Unsigned Work.     Thank you for allowing pharmacy to be a part of this patient's care.  Gillermina Hu, PharmD, BCPS, [facility] Clinical Pharmacist 09/02/2019, 5:00 PM

## 2019-09-02 NOTE — Progress Notes (Signed)
Per secure chat with Dr. Sharol Given to continue antibiotics X 2 weeks. Will consult [place] for antibiotic administration post discharge.

## 2019-09-02 NOTE — Progress Notes (Signed)
Social Work Patient ID: Kari [name], female   DOB: 10/04/1961, 58 y.o.   MRN: [id number]  Met with pt to discuss team conference goals mod/i level and ready for discharge tomorrow. Pt will have [facility] resume their services and are aware she will need IV antibiotics for two weeks. Pt sister will be assisting her at home like before. Wound vac's to be changed to temporary vac's prior to discharge home tomorrow.

## 2019-09-02 NOTE — Patient Care Conference (Signed)
Inpatient RehabilitationTeam Conference and Plan of Care Update Date: 09/02/2019   Time: 11:00 AM    Patient Name: Kari [name]      Medical Record Number: [id number]  Date of Birth: 1961-06-09 Sex: Female         Room/Bed: 4W18C/4W18C-01 Payor Info: Payor: MEDICARE / Plan: MEDICARE PART A AND B / Product Type: *No Product type* /    Admitting Diagnosis: 6. CVA 2 Team Debility; 11-12days  Admit Date/Time:  08/27/2019  2:19 PM Admission Comments: No comment available   Primary Diagnosis:  Physical debility Principal Problem: Physical debility  Patient Active Problem List   Diagnosis Date Noted  . Debility 08/27/2019  . Multiple open wounds of lower leg   . Moderate protein-calorie malnutrition ([place])   . Non-healing ulcer ([place]) 08/19/2019  . Postoperative pain   . Labile blood pressure   . Nonischemic cardiomyopathy ([place])   . Physical debility 07/20/2019  . Idiopathic chronic venous hypertension of lower extremity with ulcer, bilateral ([place]) 07/15/2019  . H/O skin graft 05/13/2019  . CKD (chronic kidney disease), stage III ([place]) 01/13/2019  . Infected wound 12/26/2018  . Idiopathic chronic venous hypertension of both lower extremities with ulcer and inflammation ([place])   . History of fall 05/10/2018  . Hyperkalemia 02/21/2018  . Hypotension 02/21/2018  . Sepsis ([place]) 02/21/2018  . Encounter for central line placement   . Mitral regurgitation 10/30/2017  . Acute kidney injury ([place]) 07/21/2017  . Generalized weakness 07/20/2017  . Shock circulatory ([place])   . Acute respiratory failure with hypoxia ([place])   . Pulmonary edema   . Cardiac arrest ([place]) 06/26/2017  . Ventricular fibrillation ([place])   . Acute on chronic diastolic heart failure ([place])   . Abnormal ECG   . Cellulitis 04/02/2017  . AKI (acute kidney injury) ([place]) 12/17/2016  . Cellulitis and abscess of right lower extremity 12/17/2016  . Syncope and collapse 12/17/2016  . Peripheral edema 12/17/2016  . Hypokalemia  12/17/2016  . Anemia of chronic disease 12/17/2016  . Acute diastolic CHF (congestive heart failure) ([place]) 03/14/2014  . CHF (congestive heart failure) ([place]) 03/12/2014  . Sarcoidosis ([place]) 03/12/2014  . Severe sepsis ([place]) 10/17/2013  . ARF (acute renal failure) ([place]) 10/08/2013  . Cellulitis of multiple sites of lower extremity 10/08/2013  . Morbid obesity ([place]) 10/08/2013  . Volume depletion 10/08/2013  . Venous (peripheral) insufficiency 10/28/2012  . Mediastinal lymphadenopathy 10/16/2012  . Pulmonary nodules 10/16/2012  . 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 Chest pain 09/02/2012  . Asthma   . Hypertension   . Depression   . Venous stasis ulcers ([place]) 07/29/2012  . Varicose veins of lower extremities with ulcer ([place]) 07/08/2012  . Venous ulcer of leg ([place]) 07/08/2012    Expected Discharge Date: Expected Discharge Date: 09/03/19  Team Members Present: Physician leading conference: Dr. Alysia Penna Social Worker Present: Ovidio Kin, LCSW Nurse Present: Isla Pence, RN PT Present: Donnamae Jude, PT OT Present: Clyda Greener, OT SLP Present: Charolett Bumpers, SLP PPS Coordinator present : Gunnar Fusi, SLP     Current Status/Progress Goal Weekly Team Focus  Medical   pt at mod I level,  teaching re wound vac , IV  d/c planning   Bowel/Bladder   continent of b/b; LBM: 09/07  gain regular bowel pattern  assist with tolieting needs prn   Swallow/Nutrition/ Hydration             ADL's   Setup for  bathing and dressing, supervision for toilet transfers as well secondary to wound vacs  modified independent overall  UE exercise, selfcare retraining, balance retraining, DME education, pt education   Mobility   supervision bed mobility, transfers using RW, and gait up to 264ft using RW  mod-I bed mobility, transfers, and gait 163ft with LRAD; supervision 8 steps L rail for home entry  activity tolerance, B LE  strengthening, stair training, gait training, patient education   Communication             Safety/Cognition/ Behavioral Observations            Pain   c/o to Bil LE; prn oxycodone 10-15mg  q4hr  pain level <4/10  assess pain level QS and prn   Skin   BLE surgical wounds with surgical wound vacs in place with compression wraps  remain free of new skin breakdown/infection  assess skin QS and prn      *See Care Plan and progress notes for long and short-term goals.     Barriers to Discharge  Current Status/Progress Possible Resolutions Date Resolved   Physician          excellent progress  d/c planning      Nursing                  PT                    OT                  SLP                SW                Discharge Planning/Teaching Needs:  Pt making progress and almost at goal level. PA clarifying regarding IV antibiotics and wound vac's      Team Discussion:  At goal level of mod/i level. Once wound vac's changed to smaller ones can be more independent with rolling walker. IV antibiotics for 2 weeks according to surgeon. Will re-insert mid-line for meds at home. Follow up with surgeon in one week and for wound care. Sister to assist with her care like prior to admission  Revisions to Treatment Plan:  DC 9/10    Continued Need for Acute Rehabilitation Level of Care: The patient requires daily medical management by a physician with specialized training in physical medicine and rehabilitation for the following conditions: Daily analysis of laboratory values and/or radiology reports with any subsequent need for medication adjustment of medical intervention for : Wound care problems;Post surgical problems   I attest that I was present, lead the team conference, and concur with the assessment and plan of the team. Teleconference held due to COVID 19   Chiquita Heckert, Gardiner Rhyme 09/02/2019, 2:55 PM

## 2019-09-02 NOTE — Progress Notes (Signed)
Occupational Therapy Discharge Summary  Patient Details  Name: Kari [name] MRN: [id number] Date of Birth: 04-09-1961  Today's Date: 09/02/2019 OT Individual Time: 9767-3419 OT Individual Time Calculation (min): 58 min   Session Note:  Pt completed bathing and dressing sit to stand at the sink with setup only secondary to wound vacs.  Feel pt could be modified independent of smaller vacs were on instead of the standard ones.  Educated pt on BUE therex as she has been completing daily with handout provided.  Modified independent for functional transfers from the bed to the bedside recliner as well as to the wheelchair.  Noted pt with air leak in the right wound which nursing was notified of as well and they were able to fix it.  Pt left in recliner with safety alarm in place and call button and phone in reach.    Patient has met 8 of 8 long term goals due to improved activity tolerance, improved balance and ability to compensate for deficits.  Patient to discharge at overall Modified Independent level.  Patient's care partner is independent to provide the necessary physical assistance at discharge.    Reasons goals not met: NA  Recommendation:  Patient will benefit from ongoing skilled OT services in home health setting to continue to advance functional skills in the area of BADL.  Feel pt will continue to benefit from OT evaluation at home for continued endurance building as well as continuing to work on functional transfers, ADLs, and IADLs.    Equipment: No equipment provided  Reasons for discharge: treatment goals met and discharge from hospital  Patient/family agrees with progress made and goals achieved: Yes  OT Discharge Precautions/Restrictions  Precautions Precautions: Fall;Other (comment) Precaution Comments: BLE wound vacs Restrictions Weight Bearing Restrictions: No RLE Weight Bearing: Weight bearing as tolerated LLE Weight Bearing: Weight bearing as  tolerated  Pain Pain Assessment Pain Scale: 0-10 Pain Score: 5  Pain Type: Acute pain Pain Location: Leg Pain Orientation: Right;Left Pain Descriptors / Indicators: Aching Pain Frequency: Constant Pain Onset: On-going Pain Intervention(s): Medication (See eMAR) ADL ADL Eating: Independent Where Assessed-Eating: Chair Grooming: Independent Where Assessed-Grooming: Standing at sink Upper Body Bathing: Modified independent Where Assessed-Upper Body Bathing: Sitting at sink Lower Body Bathing: Modified independent Where Assessed-Lower Body Bathing: Sitting at sink Upper Body Dressing: Modified independent (Device) Where Assessed-Upper Body Dressing: Sitting at sink Lower Body Dressing: Modified independent Where Assessed-Lower Body Dressing: Standing at sink, Sitting at sink Toileting: Modified independent Where Assessed-Toileting: Bedside Commode Toilet Transfer: Modified independent Toilet Transfer Method: Counselling psychologist: Extra wide bedside commode Vision Baseline Vision/History: Wears glasses Wears Glasses: Reading only Patient Visual Report: No change from baseline Vision Assessment?: No apparent visual deficits Perception  Perception: Within Functional Limits Praxis Praxis: Intact Cognition Overall Cognitive Status: Within Functional Limits for tasks assessed Arousal/Alertness: Awake/alert Orientation Level: Oriented X4 Attention: Focused;Sustained Focused Attention: Appears intact Sustained Attention: Appears intact Memory: Appears intact Awareness: Appears intact Problem Solving: Appears intact Safety/Judgment: Appears intact Sensation Sensation Light Touch: Appears Intact Hot/Cold: Appears Intact Proprioception: Appears Intact Stereognosis: Appears Intact Additional Comments: Sensation intact in BUEs Coordination Gross Motor Movements are Fluid and Coordinated: Yes Fine Motor Movements are Fluid and Coordinated: Yes Motor   Motor Motor - Discharge Observations: generalized weakness Mobility  Bed Mobility Rolling Right: Independent Rolling Left: Independent Supine to Sit: Independent Sit to Supine: Independent Transfers Sit to Stand: Independent with assistive device Stand to Sit: Independent with assistive device  Trunk/Postural Assessment  Cervical Assessment Cervical Assessment: Within Functional Limits Thoracic Assessment Thoracic Assessment: Within Functional Limits Lumbar Assessment Lumbar Assessment: Within Functional Limits  Balance Balance Balance Assessed: Yes Static Sitting Balance Static Sitting - Balance Support: No upper extremity supported;Feet supported Static Sitting - Level of Assistance: 7: Independent Dynamic Sitting Balance Dynamic Sitting - Balance Support: Feet supported Dynamic Sitting - Level of Assistance: 6: Modified independent (Device/Increase time) Static Standing Balance Static Standing - Balance Support: During functional activity Static Standing - Level of Assistance: 6: Modified independent (Device/Increase time) Dynamic Standing Balance Dynamic Standing - Balance Support: During functional activity;Bilateral upper extremity supported Dynamic Standing - Level of Assistance: 6: Modified independent (Device/Increase time) Extremity/Trunk Assessment RUE Assessment RUE Assessment: Within Functional Limits LUE Assessment LUE Assessment: Within Functional Limits   Jenicka Coxe OTR/L 09/02/2019, 11:17 AM

## 2019-09-02 NOTE — Progress Notes (Signed)
Physical Therapy Session Note  Patient Details  Name: Kari [name] MRN: [id number] Date of Birth: September 20, 1961  Today's Date: 09/02/2019 PT Individual Time: 1100-1205 PT Individual Time Calculation (min): 65 min   Short Term Goals: Week 1:  PT Short Term Goal 1 (Week 1): = to LTGs based on ELOS  Skilled Therapeutic Interventions/Progress Updates:  Pt seated in recliner.  Stand pivot to w/c with RW, PT managing lines of 2 wound vacs, only;modified independent. .  Pt reported that she will have 2 small wound vacs which she can hang on her Rw, at DC.   Simulated car transfer to 22" height, supervsion and extra time to bring LEs into car.  Pt unsure who will pick her up in which car.    Up/down (8) 6" height steps,. L rail, as per home set-up, bil hands on rail, with supervision.  Pt turned side ways to L to ascend and descend, and did not need cues for techniques or safety.  Gait on level tile with RW, x 150' inlccuding turns, modified independent; PT managed wound vacs only.  Seated in w/c, strengthening exs: 15 x 2 bil shoulder flexion, bil scapular retraction, holding 4# weighted bar, which rest breaks between bouts.  Gait in room with RW to return to recliner.  At end of session, pt resting in recliner iwht needs at hand and seat pad alarm set.      Therapy Documentation Precautions:  Precautions Precautions: Fall, Other (comment) Precaution Comments: BLE wound vacs Restrictions Weight Bearing Restrictions: No RLE Weight Bearing: Weight bearing as tolerated LLE Weight Bearing: Weight bearing as tolerated    Pain: Pain Assessment Pain Score: "my legs, a little bit; I had pain medicine"          Therapy/Group: Individual Therapy  Rishab Stoudt 09/02/2019, 12:22 PM

## 2019-09-02 NOTE — Progress Notes (Signed)
Physical Therapy Discharge Summary  Patient Details  Name: Kari [name] MRN: [id number] Date of Birth: 1961-04-06  Today's Date: 09/02/2019  Patient has met 9 of 9 long term goals due to improved activity tolerance, improved balance, increased strength, decreased pain, ability to compensate for deficits and functional use of  right lower extremity and left lower extremity.  Patient to discharge at an ambulatory level Modified Independent; supervision for car tr and stairs.    Patient's care partner, her sister, was  unavailable for family ed, but her only responsibilities would be managing wound vacs in/out of car and on stairs. Pt reported that her sister will be able to manage these without difficulty. Recommendation:  Patient will benefit from ongoing skilled PT services in home health setting to continue to advance safe functional mobility, address ongoing impairments in strength, activity tolerance, flexibility, high level blance and minimize fall risk.  Equipment: No equipment provided  Reasons for discharge: treatment goals met and discharge from hospital  Patient/family agrees with progress made and goals achieved: Yes  PT Discharge Precautions/Restrictions Precautions Precautions: Fall;Other (comment) Precaution Comments: BLE wound vacs Restrictions Weight Bearing Restrictions: No Vision/Perception - no apparent deficits; at baseline per pt    Cognition- A and O x 4   Sensation Sensation Light Touch: Appears Intact(difficult to assess in bilateral lower legs due to unna boots) Hot/Cold: Not tested Proprioception: Appears Intact Stereognosis: Not tested Coordination Gross Motor Movements are Fluid and Coordinated: Yes Fine Motor Movements are Fluid and Coordinated: Yes Heel Shin Test: NT Motor  Motor Motor: Within Functional Limits Motor - Discharge Observations: generalized weaknessbut improved since admission  Mobility Bed Mobility Rolling Right:  Independent Rolling Left: Independent Supine to Sit: Independent Sit to Supine: Independent Transfers Sit to Stand: Independent with assistive device Stand to Sit: Independent with assistive device Stand Pivot Transfers: Independent with assistive device Transfer (Assistive device): Rolling walker Locomotion  Gait Gait Assistance: Independent with assistive device Gait Distance (Feet): 150 Feet Assistive device: Rolling walker Gait Gait: Yes Gait Pattern: Impaired Gait Pattern: Decreased trunk rotation;Wide base of support;Within Functional Limits;Step-through pattern Stairs / Additional Locomotion Stairs: Yes Stairs Assistance: Supervision/Verbal cueing Stair Management Technique: One rail Left Number of Stairs: 8 Height of Stairs: 6 Wheelchair Mobility Wheelchair Mobility: No  Trunk/Postural Assessment  Cervical Assessment Cervical Assessment: Within Functional Limits Thoracic Assessment Thoracic Assessment: Within Functional Limits(rounded shoulders) Lumbar Assessment Lumbar Assessment: Within Functional Limits Postural Control Postural Control: Within Functional Limits  Balance Balance Balance Assessed: Yes Static Sitting Balance Static Sitting - Balance Support: No upper extremity supported;Feet supported Static Sitting - Level of Assistance: 7: Independent Dynamic Sitting Balance Dynamic Sitting - Balance Support: Feet supported Dynamic Sitting - Level of Assistance: 6: Modified independent (Device/Increase time) Static Standing Balance Static Standing - Balance Support: During functional activity Static Standing - Level of Assistance: 6: Modified independent (Device/Increase time) Dynamic Standing Balance Dynamic Standing - Balance Support: During functional activity;Bilateral upper extremity supported Dynamic Standing - Level of Assistance: 6: Modified independent (Device/Increase time) Extremity Assessment      RLE Assessment RLE Assessment: Exceptions  to [facility] General Strength Comments: in sitting, grossly 4+/5 hip fleixon; at least 3/5 knee extension (no resistance given) and limited movement ankle DF/PF due to Unna boot LLE Assessment LLE Assessment: Exceptions to [facility] General Strength Comments: in sitting, grossly 4-/5 hip flexion, at least 3/5 knee extension, limited ankle DF /PF due to [employer]    Vanessa Kampf 09/02/2019, 1:12 PM

## 2019-09-02 NOTE — Progress Notes (Signed)
[place] PHYSICAL MEDICINE & REHABILITATION PROGRESS NOTE   Subjective/Complaints:  Discussed abx, wound vac need for midline  ROS- neg CP, SOB, N/V/D  Objective:   No results found. Recent Labs    08/31/19 0710  WBC 5.5  HGB 8.2*  HCT 27.3*  PLT 237   Recent Labs    08/31/19 0710  NA 139  K 3.7  CL 105  CO2 27  GLUCOSE 112*  BUN 9  CREATININE 0.90  CALCIUM 8.6*   No intake or output data in the 24 hours ending 09/02/19 0913   Physical Exam: Vital Signs Blood pressure 113/72, pulse 66, temperature 97.9 F (36.6 C), temperature source Oral, resp. rate 17, height _0  (1.702 m), weight 130 kg, last menstrual period 11/21/2011, SpO2 99 %.  General: labs and vitals reviewed Awake, alert, appropriate, sitting up slightly in bed; sleepy; NAD Mood and affect are appropriate Heart: no JVD seen; RRR Lungs: Clear to auscultation B/L Abdomen: hypoactive BS, soft, NT, slightly distended vs protuberant Extremities: No clubbing, cyanosis, or edema Skin: ACE wraps B legs with wound vacs Neurologic: Cranial nerves II through XII intact, motor strength is 5/5 in bilateral deltoid, bicep, tricep, grip,4 hip flexor, knee extensors, ankle dorsiflexor and plantar flexor Sensory exam normal sensation to light touch and proprioception in bilateral upper and lower extremities Cerebellar exam normal finger to nose to finger as well as heel to shin in bilateral upper and lower extremities Musculoskeletal: Full range of motion in all 4 extremities. No joint swelling    Assessment/Plan: 1. Functional deficits secondary to debility  which require 3+ hours per day of interdisciplinary therapy in a comprehensive inpatient rehab setting.  Physiatrist is providing close team supervision and 24 hour management of active medical problems listed below.  Physiatrist and rehab team continue to assess barriers to discharge/monitor patient progress toward functional and medical goals  Care  Tool:  Bathing    Body parts bathed by patient: Right arm, Left arm, Chest, Abdomen, Face, Front perineal area, Buttocks, Right upper leg, Left upper leg   Body parts bathed by helper: Right lower leg, Left lower leg Body parts n/a: Right lower leg, Left lower leg(unable secondary to wound vacs)   Bathing assist Assist Level: Supervision/Verbal cueing     Upper Body Dressing/Undressing Upper body dressing   What is the patient wearing?: Pull over shirt    Upper body assist Assist Level: Independent    Lower Body Dressing/Undressing Lower body dressing    Lower body dressing activity did not occur: N/A What is the patient wearing?: Hospital gown only     Lower body assist Assist for lower body dressing: Independent with assitive device     Toileting Toileting    Toileting assist Assist for toileting: Independent with assistive device     Transfers Chair/bed transfer  Transfers assist     Chair/bed transfer assist level: Independent with assistive device Chair/bed transfer assistive device: Walker, Clinical biochemist   Ambulation assist      Assist level: Independent with assistive device Assistive device: Walker-rolling Max distance: 10'   Walk 10 feet activity   Assist     Assist level: Supervision/Verbal cueing Assistive device: Walker-rolling   Walk 50 feet activity   Assist    Assist level: Supervision/Verbal cueing Assistive device: Walker-rolling    Walk 150 feet activity   Assist Walk 150 feet activity did not occur: Safety/medical concerns  Assist level: Supervision/Verbal cueing Assistive device: Walker-rolling  Walk 10 feet on uneven surface  activity   Assist Walk 10 feet on uneven surfaces activity did not occur: Safety/medical concerns         Wheelchair     Assist Will patient use wheelchair at discharge?: No(TBD but anticipate pt will D/C at ambulatory level)             Wheelchair  50 feet with 2 turns activity    Assist            Wheelchair 150 feet activity     Assist          Blood pressure 113/72, pulse 66, temperature 97.9 F (36.6 C), temperature source Oral, resp. rate 17, height _0  (1.702 m), weight 130 kg, last menstrual period 11/21/2011, SpO2 99 %.  Medical Problem List and Plan: 1.Functional and mobility deficitssecondary to multiple medical, bilateral LE wound infections s/p I&D with vac Team conference today please see physician documentation under team conference tab, met with team face-to-face to discuss problems,progress, and goals. Formulized individual treatment plan based on medical history, underlying problem and comorbidities.   2  DVT prophylaxis -DVT/anticoagulation:Pharmaceutical:Lovenox -antiplatelet therapy: pt reports being on ASA daily at home "for my heart" - await stool guaic if neg may resume 63m 3. Pain Management:Controlled withOxycodone prn. 4. Mood:LCSW to follow for evaluation and support. -antipsychotic agents: N/A 5. Neuropsych: This patientiscapable of making decisions on herown behalf. 6. Skin/Wound Care:Continue wound VAC for at least a weekthen transition to PRoannat d/c?. 7. Fluids/Electrolytes/Nutrition:Monitor I/O. Check lytes in am.  8. NICM s/p ICD: Continue Coreg bid.  9. Acute on chronic anemia: Add iron supplement. Dropped to 7.4 but not symptomatic hold off on transfusion  9/5- up to 7.7 , 8.2 on 9/7    OB neg may resume ASA  10. Wound infection: On IV Fortaz for 2wks post d/c ~9/24  9/6- of note, pt's albumin really low at 1.9- will check prealbumin in AM 11. Leucocytosis: Monitor for resolution. Monitor for fevers and other signs of infection.  12. Acute on chronic renal failure: Baseline SCR 1.3.   9/5- Cr 1.07 and BUN 14 13. Prediabetes: Hgb A1c- 5.8--CM diet. 14. Hypokalemia- 9/5- K+ 3.2- will replete in addition to daily 20 mEq and recheck  Monday. 15/ Constipation  9/6- will d/c colace, add senokot-S 2 tabs BID; then have nursing give either additional miralax or sorbitol prn that I ordered- if doesn't go by evening, needs suppository- hopefully will go today. -can't do occult blood card until has BM.    LOS: 6 days A FACE TO FACE EVALUATION WAS PERFORMED  ACharlett Blake9/08/2019, 9:13 AM

## 2019-09-02 NOTE — Progress Notes (Signed)
Social Work Patient ID: Kari [name], female   DOB: October 27, 1961, 58 y.o.   MRN: [id number]  Have contacted [facility] to inform of nine more days of IV antibiotics for pt. Will work on set up and teaching due to pt to discharge home tomorrow.

## 2019-09-03 MED ORDER — METRONIDAZOLE 500 MG PO TABS: 500.0000 mg | ORAL_TABLET | Freq: Four times a day (QID) | ORAL | 0 refills | Status: DC

## 2019-09-03 MED ORDER — OXYCODONE HCL 10 MG PO TABS: 5.0000 mg | ORAL_TABLET | Freq: Four times a day (QID) | ORAL | 0 refills | Status: DC | PRN

## 2019-09-03 MED ORDER — POLYETHYLENE GLYCOL 3350 17 G PO PACK: 17.0000 g | PACK | Freq: Every day | ORAL | 0 refills | Status: AC | PRN

## 2019-09-03 MED ORDER — SENNOSIDES-DOCUSATE SODIUM 8.6-50 MG PO TABS: 2.0000 | ORAL_TABLET | Freq: Two times a day (BID) | ORAL | 0 refills | Status: AC

## 2019-09-03 MED ORDER — ASPIRIN 81 MG PO CHEW
81.0000 mg | CHEWABLE_TABLET | Freq: Every day | ORAL | Status: DC
  Administered 2019-09-03: 81 mg via ORAL
  Filled 2019-09-03: qty 1

## 2019-09-03 MED ORDER — SODIUM CHLORIDE 0.9 % IV SOLN
2.0000 g | Freq: Three times a day (TID) | INTRAVENOUS | Status: DC
  Administered 2019-09-03 (×3): 2 g via INTRAVENOUS
  Filled 2019-09-03 (×4): qty 2

## 2019-09-03 NOTE — Progress Notes (Signed)
Social Work Discharge Note   The overall goal for the admission was met for:   Discharge location: Yes-HOME WITH SISTER-[name] ASSISTING HER  Length of Stay: Yes-7 DAYS  Discharge activity level: Yes-MOD/I LEVEL  Home/community participation: Yes  Services provided included: MD, RD, PT, OT, RN, CM, TR, Pharmacy and SW  Financial Services: Medicare and Medicaid  Follow-up services arranged: Home Health: [facility]-PT, & RN and Patient/Family request agency HH: [facility], DME: NO NEEDS HAS FROM PREVIOUS ADMITS  Comments (or additional information):PT AND SISTER HELP ONE ANOTHER. PT HAS BEEN THROUGH THIS BEFORE AND KNOWS WHAT TO DO. COMFORTABLE WITH DC TODAY  Patient/Family verbalized understanding of follow-up arrangements: Yes  Individual responsible for coordination of the follow-up plan: SELF & [name]-SISTER  Confirmed correct DME delivered: Kari [name] 09/03/2019    Kari [name]

## 2019-09-03 NOTE — Discharge Summary (Signed)
Physician Discharge Summary  Patient ID: Kari [name] MRN: [id number] DOB/AGE: 57-Sep-1962 57 y.o.  Admit date: 08/27/2019 Discharge date: 09/03/2019  Discharge Diagnoses:  Principal Problem:   Physical debility Active Problems:   Hypertension   Morbid obesity ([place])   Anemia of chronic disease   Infected wound   Postoperative pain   Discharged Condition: stable   Significant Diagnostic Studies:   Labs:  Basic Metabolic Panel: BMP Latest Ref Rng & Units 08/31/2019 08/28/2019 08/27/2019  Glucose 70 - 99 mg/dL 112(H) 104(H) 156(H)  BUN 6 - 20 mg/dL '9 14 12  ' Creatinine 0.44 - 1.00 mg/dL 0.90 1.07(H) 1.20(H)  BUN/Creat Ratio 9 - 23 - - -  Sodium 135 - 145 mmol/L 139 139 137  Potassium 3.5 - 5.1 mmol/L 3.7 3.2(L) 3.7  Chloride 98 - 111 mmol/L 105 106 105  CO2 22 - 32 mmol/L '27 26 23  ' Calcium 8.9 - 10.3 mg/dL 8.6(L) 8.6(L) 8.6(L)    CBC: CBC Latest Ref Rng & Units 08/31/2019 08/29/2019 08/28/2019  WBC 4.0 - 10.5 K/uL 5.5 7.6 10.8(H)  Hemoglobin 12.0 - 15.0 g/dL 8.2(L) 7.7(L) 7.4(L)  Hematocrit 36.0 - 46.0 % 27.3(L) 26.9(L) 24.9(L)  Platelets 150 - 400 K/uL 237 244 256    CBG: No results for input(s): GLUCAP in the last 168 hours.  Brief HPI:   Kari [name] is a 58 y.o. female with history of HTN, NISCM s/p ICD, chronic stasis ulcer BLE with recent admission for I and D with skin grafts and was discharged to home. She developed dehiscence and necrosis of wound due to inability to care for wound. She was admitted on 08/19/19 due to increase in drainage with foul smell, leucocytosis and pain. She was started on antibiotics as well as IVF for acute on chronic renal failure. She underwent I & D of wound on 08/30 and STSG placed by Dr. Sharol Given on 09/02 with post op wound VACs.  Wound cultures done which were positive for pseudomonas aeruginosa and proteus and Cipro added with recommendations to continue antibiotics for 4 weeks. Post op WBC trending down and ABLA was being monitored. Therapy  evaluations completed and CIR recommended due to functional deficits   Hospital Course: ROSEALEE RECINOS was admitted to rehab 08/27/2019 for inpatient therapies to consist of PT and OT at least three hours five days a week. Past admission physiatrist, therapy team and rehab RN have worked together to provide customized collaborative inpatient rehab. Follow up CBC showed that reactive leucocytosis has resolved and ABLA is improving. Hypokalemia has resolved with increase in potassium supplement briefly and she is to resume home dose K dur at discharge. Wound cultures had finalized at admission and showed pseudomonas aeruginosa and proteus mirabilis.  ID pharmacist recommended changing Cipro to Ceftazidime and metronidazole for better coverage of pseudomonas. Dr. Jess Barters was contacted for input on length of stay and recommended continuing patient on additional week after discharge.   Blood pressures were monitored on TID basis and have been stable on coreg bid. Wound VAC has been kept in place and was changed out to [facility] at discharge. Patient is to follow up with Dr. Sharol Given next week for dressing change. Wound dressings are dry and she was advised to continue to elevate BLE to help with edema control. Pain has been controlled with use of oxycodone on prn basis and OIC has resolved with addition of Senna S.  She has been educated on carb modified diet as well as calorie restriction to  help promote weight loss. She has made gains during rehab stay and is modified independent. She will continue to receive follow up HHPT and [place] by [facility]  after discharge   Rehab course: During patient's stay in rehab team conferences was held to monitor patient's progress, set goals and discuss barriers to discharge. At admission, patient required min assist with ADL tasks and CGA with mobility.  She  has had improvement in activity tolerance, balance, postural control as well as ability to compensate for deficits.  She is able to complete ADL tasks at modified independent level. She is modified independent for transfers and to ambulate 150' with use of RW. Her sister will assist as needed after discharge.   Disposition: Home  Diet: Carb Modified.   Special Instructions: 1. Plug in [place] when resting and at bedtime. 2. Antibiotics to continue for additional week till follow up with Dr. Sharol Given.   Discharge Instructions    Home infusion instructions [facility] May follow [place] Dosing Protocol; May administer Cathflo as needed to maintain patency of vascular access device.; Flushing of vascular access device: per [facility] Protocol: 0.9% NaCl pre/post medica...   Complete by: As directed    Instructions: May follow [place] Dosing Protocol   Instructions: May administer Cathflo as needed to maintain patency of vascular access device.   Instructions: Flushing of vascular access device: per [facility] Protocol: 0.9% NaCl pre/post medication administration and prn patency; Heparin 100 u/ml, 61m for implanted ports and Heparin 10u/ml, 569mfor all other central venous catheters.   Instructions: May follow [facility] Anaphylaxis Protocol for First Dose Administration in the home: 0.9% NaCl at 25-50 ml/hr to maintain IV access for protocol meds. Epinephrine 0.3 ml IV/IM PRN and Benadryl 25-50 IV/IM PRN s/s of anaphylaxis.   Instructions: AdAttallanfusion Coordinator (RN) to assist per patient IV care needs in the home PRN.     Allergies as of 09/03/2019      Reactions   Tramadol Other (See Comments)   hallucination      Medication List    STOP taking these medications   ciprofloxacin 500 MG tablet Commonly known as: CIPRO   docusate sodium 100 MG capsule Commonly known as: COLACE   furosemide 40 MG tablet Commonly known as: LASIX     TAKE these medications   acetaminophen 325 MG tablet Commonly known as: TYLENOL Take 1-2 tablets (325-650 mg total) by mouth every 4 (four) hours as needed  for mild pain.   albuterol 108 (90 Base) MCG/ACT inhaler Commonly known as: VENTOLIN HFA Inhale 2 puffs into the lungs every 6 (six) hours as needed for shortness of breath.   allopurinol 100 MG tablet Commonly known as: ZYLOPRIM Take 2 tablets (200 mg total) by mouth daily. What changed:   how much to take  when to take this   aspirin EC 81 MG tablet Take 81 mg by mouth daily.   carvedilol 6.25 MG tablet Commonly known as: COREG Take 1 tablet (6.25 mg total) by mouth 2 (two) times daily.   cefTAZidime  IVPB Commonly known as: FORTAZ Inject 1 g into the vein every 8 (eight) hours for 8 days. Indication:  Wound infection Last Day of Therapy:  8 days Labs - Once weekly:  CBC/D and BMP, Labs - Every other week:  ESR and CRP   metroNIDAZOLE 500 MG tablet Commonly known as: FLAGYL Take 1 tablet (500 mg total) by mouth every 6 (six) hours.  Oxycodone HCl 10 MG tablet # 28 pills Take 0.5-1 tablets (5-10 mg total) by mouth every 6 (six) hours as needed for severe pain.   polyethylene glycol 17 g packet Commonly known as: MIRALAX / GLYCOLAX Take 17 g by mouth daily as needed.   potassium chloride 10 MEQ tablet Commonly known as: K-DUR Take 30 mEq by mouth daily.   senna-docusate 8.6-50 MG tablet Commonly known as: Senokot-S Take 2 tablets by mouth 2 (two) times daily.            Home Infusion Instuctions  (From admission, onward)         Start     Ordered   09/02/19 0000  Home infusion instructions [facility] May follow [place] Dosing Protocol; May administer Cathflo as needed to maintain patency of vascular access device.; Flushing of vascular access device: per [facility] Protocol: 0.9% NaCl pre/post medica...    Question Answer Comment  Instructions May follow [place] Dosing Protocol   Instructions May administer Cathflo as needed to maintain patency of vascular access device.   Instructions Flushing of vascular access device: per [facility] Protocol:  0.9% NaCl pre/post medication administration and prn patency; Heparin 100 u/ml, 9m for implanted ports and Heparin 10u/ml, 537mfor all other central venous catheters.   Instructions May follow [facility] Anaphylaxis Protocol for First Dose Administration in the home: 0.9% NaCl at 25-50 ml/hr to maintain IV access for protocol meds. Epinephrine 0.3 ml IV/IM PRN and Benadryl 25-50 IV/IM PRN s/s of anaphylaxis.   Instructions [facility] Infusion Coordinator (RN) to assist per patient IV care needs in the home PRN.      09/02/19 1631         Follow-up Information    Kirsteins, AnLuanna SalkMD Follow up.   Specialty: Physical Medicine and Rehabilitation Why: Office will call you with follow up appointment Contact information: [phone]        DuNewt MinionMD. Call on 09/04/2019.   Specialty: Orthopedic Surgery Why: for appointment in a week/dressing change Contact information: [phone]        SaRutherford GuysMD. Call on 09/04/2019.   Specialty: Family Medicine Why: for post hospital follow up Contact information: [id number] [phone]           Signed: PaBary Leriche/14/2020, 12:40 AM

## 2019-09-03 NOTE — Progress Notes (Signed)
[place] PHYSICAL MEDICINE & REHABILITATION PROGRESS NOTE   Subjective/Complaints:  Appreciate pharmacy note , midline in and functioning, just received Prevena portable wound vac   ROS- neg CP, SOB, N/V/D  Objective:   No results found. No results for input(s): WBC, HGB, HCT, PLT in the last 72 hours. No results for input(s): NA, K, CL, CO2, GLUCOSE, BUN, CREATININE, CALCIUM in the last 72 hours.  Intake/Output Summary (Last 24 hours) at 09/03/2019 0843 Last data filed at 09/03/2019 0600 Gross per 24 hour  Intake 255 ml  Output -  Net 255 ml     Physical Exam: Vital Signs Blood pressure 112/74, pulse 76, temperature (!) 97.5 F (36.4 C), temperature source Oral, resp. rate 17, height 5\' 7"  (1.702 m), weight 130 kg, last menstrual period 11/21/2011, SpO2 100 %.  General: labs and vitals reviewed Awake, alert, appropriate, sitting up slightly in bed; sleepy; NAD Mood and affect are appropriate Heart: no JVD seen; RRR Lungs: Clear to auscultation B/L Abdomen: hypoactive BS, soft, NT, slightly distended vs protuberant Extremities: No clubbing, cyanosis, or edema Skin: ACE wraps B legs with wound vacs Neurologic: Cranial nerves II through XII intact, motor strength is 5/5 in bilateral deltoid, bicep, tricep, grip,4 hip flexor, knee extensors, ankle dorsiflexor and plantar flexor Sensory exam normal sensation to light touch and proprioception in bilateral upper and lower extremities Cerebellar exam normal finger to nose to finger as well as heel to shin in bilateral upper and lower extremities Musculoskeletal: Full range of motion in all 4 extremities. No joint swelling    Assessment/Plan: 1. Functional deficits secondary to debility  which require 3+ hours per day of interdisciplinary therapy in a comprehensive inpatient rehab setting.  Physiatrist is providing close team supervision and 24 hour management of active medical problems listed below.  Physiatrist and rehab  team continue to assess barriers to discharge/monitor patient progress toward functional and medical goals  Care Tool:  Bathing    Body parts bathed by patient: Right arm, Left arm, Chest, Abdomen, Face, Front perineal area, Buttocks, Right upper leg, Left upper leg   Body parts bathed by helper: Right lower leg, Left lower leg Body parts n/a: Right lower leg, Left lower leg(unable secondary to wound vacs)   Bathing assist Assist Level: Supervision/Verbal cueing     Upper Body Dressing/Undressing Upper body dressing   What is the patient wearing?: Pull over shirt    Upper body assist Assist Level: Independent    Lower Body Dressing/Undressing Lower body dressing    Lower body dressing activity did not occur: N/A What is the patient wearing?: Hospital gown only     Lower body assist Assist for lower body dressing: Independent with assitive device     Toileting Toileting    Toileting assist Assist for toileting: Independent with assistive device     Transfers Chair/bed transfer  Transfers assist     Chair/bed transfer assist level: Independent Chair/bed transfer assistive device: Museum/gallery exhibitions officer assist      Assist level: Independent with assistive device Assistive device: Walker-rolling Max distance: 150   Walk 10 feet activity   Assist     Assist level: Independent with assistive device Assistive device: Walker-rolling   Walk 50 feet activity   Assist    Assist level: Independent with assistive device Assistive device: Walker-rolling    Walk 150 feet activity   Assist Walk 150 feet activity did not occur: Safety/medical concerns  Assist level: Independent with  assistive device Assistive device: Walker-rolling    Walk 10 feet on uneven surface  activity   Assist Walk 10 feet on uneven surfaces activity did not occur: Safety/medical concerns         Wheelchair     Assist Will patient use  wheelchair at discharge?: No             Wheelchair 50 feet with 2 turns activity    Assist            Wheelchair 150 feet activity     Assist          Blood pressure 112/74, pulse 76, temperature (!) 97.5 F (36.4 C), temperature source Oral, resp. rate 17, height 5\' 7"  (1.702 m), weight 130 kg, last menstrual period 11/21/2011, SpO2 100 %.  Medical Problem List and Plan: 1.Functional and mobility deficitssecondary to multiple medical, bilateral LE wound infections s/p I&D with vac   D/C today , IV Fortaz TID until 9/18  2  DVT prophylaxis -DVT/anticoagulation:Pharmaceutical:Lovenox -antiplatelet therapy:ASA 81mg  daily 3. Pain Management:Controlled withOxycodone prn. 4. Mood:LCSW to follow for evaluation and support. -antipsychotic agents: N/A 5. Neuropsych: This patientiscapable of making decisions on herown behalf. 6. Skin/Wound Care:Continue wound VAC for at least a weekthen transition to [place] at d/c?. 7. Fluids/Electrolytes/Nutrition:Monitor I/O. Check lytes in am.  8. NICM s/p ICD: Continue Coreg bid.  9. Acute on chronic anemia: Add iron supplement. Dropped to 7.4 but not symptomatic hold off on transfusion  9/5- up to 7.7 , 8.2 on 9/7    OB neg may resume ASA  10. Wound infection:  IV Fortaz through 9/18  9/6- of note, pt's albumin really low at 1.9- will check prealbumin in AM 11. Leucocytosis: Monitor for resolution. Monitor for fevers and other signs of infection.  12. Acute on chronic renal failure: Baseline SCR 1.3.   9/5- Cr 1.07 and BUN 14 13. Prediabetes: Hgb A1c- 5.8--CM diet. 14. Hypokalemia- 9/5- K+ 3.2- will replete in addition to daily 20 mEq and recheck Monday. 15/ Constipation  9/6- will d/c colace, add senokot-S 2 tabs BID; then have nursing give either additional miralax or sorbitol prn that I ordered- if doesn't go by evening, needs suppository- hopefully will go today. -can't do occult  blood card until has BM.    LOS: 7 days A FACE TO FACE EVALUATION WAS PERFORMED  Charlett Blake 09/03/2019, 8:43 AM

## 2019-09-03 NOTE — Progress Notes (Signed)
Patient restful throughout shift , anticipate discharge home today, Continue monitring and current treatment plan. Refer to assessment documentation as needed

## 2019-09-03 NOTE — Plan of Care (Signed)
Wound vac(s) changed out to portable.

## 2019-09-03 NOTE — Discharge Instructions (Signed)
Inpatient Rehab Discharge Instructions  JAMMY STLOUIS Discharge date and time: 09/03/19   Activities/Precautions/ Functional Status: Activity: no lifting, driving, or strenuous exercise for till cleared by MD Diet: cardiac diet Wound Care: Keep dressings dry. If the wound VAC stops working or if dressng get wet call Dr. Sharol Given to get it evaluated.     Functional status:  ___ No restrictions     ___ Walk up steps independently ___ 24/7 supervision/assistance   ___ Walk up steps with assistance _X__ Intermittent supervision/assistance  ___ Bathe/dress independently ___ Walk with walker     ___ Bathe/dress with assistance ___ Walk Independently    ___ Shower independently ___ Walk with assistance    ___ Shower with assistance _X__ No alcohol     ___ Return to work/school ________  Special Instructions: 1. Plug in wound VAC when you are sitting or at bedtime. 2. Keep legs elevated when seated to decrease swelling.    COMMUNITY REFERRALS UPON DISCHARGE:    Home Health:   PT, OT, RN   Agency:[facility]   Phone:[phone]   Date of last service:09/03/2019  Medical Equipment/Items Ordered:NO NEEDS HAS FROM PREVIOUS ADMITS    My questions have been answered and I understand these instructions. I will adhere to these goals and the provided educational materials after my discharge from the hospital.  Patient/Caregiver Signature _______________________________ Date __________  Clinician Signature _______________________________________ Date __________  Please bring this form and your medication list with you to all your follow-up doctor's appointments.

## 2019-09-03 NOTE — Progress Notes (Signed)
Pt d/c to home with personal property, discharge instructions provided by P.Love, PA, pt verbalized an understanding of medications, follow up appts and discharge instructions.

## 2019-09-03 NOTE — Progress Notes (Signed)
PHARMACY CONSULT NOTE FOR:  OUTPATIENT  PARENTERAL ANTIBIOTIC THERAPY (OPAT)  Indication:  Wound infection Regimen:  Ceftazidime 2 gm IV Q 8 hrs End date:  09/11/19  IV antibiotic discharge orders are pended. To discharging provider:  please sign these orders via discharge navigator,  Select New Orders & click on the button choice - Manage This Unsigned Work.     Elicia Lamp, PharmD, BCPS Please check AMION for all [place] contact numbers Clinical Pharmacist 09/03/2019 7:55 AM

## 2019-09-04 DIAGNOSIS — I5033 Acute on chronic diastolic (congestive) heart failure: Secondary | ICD-10-CM | POA: Diagnosis not present

## 2019-09-04 DIAGNOSIS — I130 Hypertensive heart and chronic kidney disease with heart failure and stage 1 through stage 4 chronic kidney disease, or unspecified chronic kidney disease: Secondary | ICD-10-CM | POA: Diagnosis not present

## 2019-09-04 DIAGNOSIS — L97829 Non-pressure chronic ulcer of other part of left lower leg with unspecified severity: Secondary | ICD-10-CM | POA: Diagnosis not present

## 2019-09-04 DIAGNOSIS — I87313 Chronic venous hypertension (idiopathic) with ulcer of bilateral lower extremity: Secondary | ICD-10-CM | POA: Diagnosis not present

## 2019-09-04 DIAGNOSIS — N183 Chronic kidney disease, stage 3 (moderate): Secondary | ICD-10-CM | POA: Diagnosis not present

## 2019-09-04 DIAGNOSIS — L97819 Non-pressure chronic ulcer of other part of right lower leg with unspecified severity: Secondary | ICD-10-CM | POA: Diagnosis not present

## 2019-09-07 ENCOUNTER — Telehealth: Payer: Self-pay | Admitting: Registered Nurse

## 2019-09-07 ENCOUNTER — Telehealth: Payer: Self-pay | Admitting: Family

## 2019-09-07 DIAGNOSIS — I87313 Chronic venous hypertension (idiopathic) with ulcer of bilateral lower extremity: Secondary | ICD-10-CM | POA: Diagnosis not present

## 2019-09-07 DIAGNOSIS — N183 Chronic kidney disease, stage 3 (moderate): Secondary | ICD-10-CM | POA: Diagnosis not present

## 2019-09-07 DIAGNOSIS — L97829 Non-pressure chronic ulcer of other part of left lower leg with unspecified severity: Secondary | ICD-10-CM | POA: Diagnosis not present

## 2019-09-07 DIAGNOSIS — I130 Hypertensive heart and chronic kidney disease with heart failure and stage 1 through stage 4 chronic kidney disease, or unspecified chronic kidney disease: Secondary | ICD-10-CM | POA: Diagnosis not present

## 2019-09-07 DIAGNOSIS — I5033 Acute on chronic diastolic (congestive) heart failure: Secondary | ICD-10-CM | POA: Diagnosis not present

## 2019-09-07 DIAGNOSIS — L97819 Non-pressure chronic ulcer of other part of right lower leg with unspecified severity: Secondary | ICD-10-CM | POA: Diagnosis not present

## 2019-09-07 DIAGNOSIS — S81809A Unspecified open wound, unspecified lower leg, initial encounter: Secondary | ICD-10-CM | POA: Diagnosis not present

## 2019-09-07 NOTE — Telephone Encounter (Signed)
Kari [name] from [facility] called to see  About getting [place] orders following discharge.  Skilled Nursing for IV antibiotics, teaching and Education 1x week 4 2PRN visits for IV complications.  Please call Jenny Reichmann [phone]

## 2019-09-07 NOTE — Telephone Encounter (Signed)
VO was given, Kari [name] understood orders.

## 2019-09-07 NOTE — Telephone Encounter (Signed)
Transitional Care call  Patient name: Kari [name]  DOB: [birth year] 1. Are you/is patient experiencing any problems since coming home? No a. Are there any questions regarding any aspect of care? No 2. Are there any questions regarding medications administration/dosing? No a. Are meds being taken as prescribed? No b. "Patient should review meds with caller to confirm" Medication List Reviewed. 3. Have there been any falls? No 4. Has Home Health been to the house and/or have they contacted you? Yes, [facility] a. If not, have you tried to contact them? NA b. Can we help you contact them? No 5. Are bowels and bladder emptying properly? Yes a. Are there any incontiinence issues? No b. If applicable, is patient following bowel/bladder programs? NA 6. Any fevers, problems with breathing, unexpected pain? No 7. Are there any skin problems or new areas of breakdown? No 8. Has the patient/family member arranged specialty MD follow up (ie cardiology/neurology/renal/surgical/etc.)?  [name] was instructed to call her PCP Dr. [name] to schedule a HFU appointment, she verbalizes understanding.  a. Can we help arrange? NA 9. Does the patient need any other services or support that we can help arrange? No 10. Are caregivers following through as expected in assisting the patient? Yes 11. Has the patient quit smoking, drinking alcohol, or using drugs as recommended? [name] denies smoking, drinking alcohol or using illicit drugs.   Appointment date/time 09/16/2019  arrival time 11:00 for 11:20 appointment with Bayard Hugger ANP-C.  At [place]

## 2019-09-08 ENCOUNTER — Telehealth: Payer: Self-pay | Admitting: Orthopedic Surgery

## 2019-09-08 ENCOUNTER — Encounter: Payer: Medicare Other | Admitting: *Deleted

## 2019-09-08 DIAGNOSIS — L97819 Non-pressure chronic ulcer of other part of right lower leg with unspecified severity: Secondary | ICD-10-CM | POA: Diagnosis not present

## 2019-09-08 DIAGNOSIS — I5033 Acute on chronic diastolic (congestive) heart failure: Secondary | ICD-10-CM | POA: Diagnosis not present

## 2019-09-08 DIAGNOSIS — I130 Hypertensive heart and chronic kidney disease with heart failure and stage 1 through stage 4 chronic kidney disease, or unspecified chronic kidney disease: Secondary | ICD-10-CM | POA: Diagnosis not present

## 2019-09-08 DIAGNOSIS — L97829 Non-pressure chronic ulcer of other part of left lower leg with unspecified severity: Secondary | ICD-10-CM | POA: Diagnosis not present

## 2019-09-08 DIAGNOSIS — N183 Chronic kidney disease, stage 3 (moderate): Secondary | ICD-10-CM | POA: Diagnosis not present

## 2019-09-08 DIAGNOSIS — I87313 Chronic venous hypertension (idiopathic) with ulcer of bilateral lower extremity: Secondary | ICD-10-CM | POA: Diagnosis not present

## 2019-09-08 NOTE — Telephone Encounter (Signed)
Gracie/[facility]/PT called and stated that she needed verbal order for Home PT  2x week 3  [phone]

## 2019-09-08 NOTE — Telephone Encounter (Signed)
I called and gave VO via voicemail Gracie for orders.

## 2019-09-10 DIAGNOSIS — I87313 Chronic venous hypertension (idiopathic) with ulcer of bilateral lower extremity: Secondary | ICD-10-CM | POA: Diagnosis not present

## 2019-09-10 DIAGNOSIS — L97829 Non-pressure chronic ulcer of other part of left lower leg with unspecified severity: Secondary | ICD-10-CM | POA: Diagnosis not present

## 2019-09-10 DIAGNOSIS — I130 Hypertensive heart and chronic kidney disease with heart failure and stage 1 through stage 4 chronic kidney disease, or unspecified chronic kidney disease: Secondary | ICD-10-CM | POA: Diagnosis not present

## 2019-09-10 DIAGNOSIS — N183 Chronic kidney disease, stage 3 (moderate): Secondary | ICD-10-CM | POA: Diagnosis not present

## 2019-09-10 DIAGNOSIS — I5033 Acute on chronic diastolic (congestive) heart failure: Secondary | ICD-10-CM | POA: Diagnosis not present

## 2019-09-10 DIAGNOSIS — L97819 Non-pressure chronic ulcer of other part of right lower leg with unspecified severity: Secondary | ICD-10-CM | POA: Diagnosis not present

## 2019-09-11 ENCOUNTER — Other Ambulatory Visit: Payer: Self-pay

## 2019-09-11 ENCOUNTER — Other Ambulatory Visit: Payer: Self-pay | Admitting: Family

## 2019-09-11 ENCOUNTER — Encounter: Payer: Self-pay | Admitting: Family

## 2019-09-11 ENCOUNTER — Ambulatory Visit (INDEPENDENT_AMBULATORY_CARE_PROVIDER_SITE_OTHER): Payer: Medicare Other | Admitting: Family

## 2019-09-11 VITALS — Ht 67.0 in | Wt 286.0 lb

## 2019-09-11 DIAGNOSIS — Z945 Skin transplant status: Secondary | ICD-10-CM

## 2019-09-11 NOTE — Progress Notes (Signed)
Post-Op Visit Note   Patient: Kari [name]           Date of Birth: 1961/07/29           MRN: [id number] Visit Date: 09/11/2019 PCP: Rutherford Guys, MD  Chief Complaint:  Chief Complaint  Patient presents with  . Right Leg - Routine Post Op    08/26/19 STSG BLE   . Left Leg - Routine Post Op    HPI:  HPI The patient is a 58 year old woman seen today for evaluation of bilateral lower extremities.  She has massive venous stasis swelling with massive ulcerations to the lateral aspects of her legs.  She is status post irrigation debridement with split thickness skin grafting on September 2.  Wound vacs removed today.  Ortho Exam On examination bilateral lower extremity she has pitting edema above the level of the VAC dressing.  There is maceration to the right lower extremity where there is been moisture inside her VAC dressing.  Bilateral graft sites great looking with graft material stapled in place there is bleeding granulation coming through.  There is no odor no purulence no erythema  Visit Diagnoses: No diagnosis found.  Plan: Vaseline dressings applied to the grafts with an Unna boot and Dynaflex dressings applied to the lower extremities discussed elevation she will follow-up in office on Tuesday for reevaluation of her wounds  Follow-Up Instructions: Return in about 4 days (around 09/15/2019).   Imaging: No results found.  Orders:  No orders of the defined types were placed in this encounter.  No orders of the defined types were placed in this encounter.    PMFS History: Patient Active Problem List   Diagnosis Date Noted  . Multiple open wounds of lower leg   . Moderate protein-calorie malnutrition ([place])   . Non-healing ulcer ([place]) 08/19/2019  . Postoperative pain   . Labile blood pressure   . Nonischemic cardiomyopathy ([place])   . Physical debility 07/20/2019  . Idiopathic chronic venous hypertension of lower extremity with ulcer, bilateral ([employer]) 07/15/2019   . H/O skin graft 05/13/2019  . CKD (chronic kidney disease), stage III ([place]) 01/13/2019  . Infected wound 12/26/2018  . Idiopathic chronic venous hypertension of both lower extremities with ulcer and inflammation ([place])   . History of fall 05/10/2018  . Hyperkalemia 02/21/2018  . Hypotension 02/21/2018  . Sepsis ([place]) 02/21/2018  . Encounter for central line placement   . Mitral regurgitation 10/30/2017  . Acute kidney injury ([place]) 07/21/2017  . Generalized weakness 07/20/2017  . Shock circulatory ([place])   . Acute respiratory failure with hypoxia ([place])   . Pulmonary edema   . Cardiac arrest ([place]) 06/26/2017  . Ventricular fibrillation ([place])   . Acute on chronic diastolic heart failure ([place])   . Abnormal ECG   . Cellulitis 04/02/2017  . AKI (acute kidney injury) ([place]) 12/17/2016  . Cellulitis and abscess of right lower extremity 12/17/2016  . Syncope and collapse 12/17/2016  . Peripheral edema 12/17/2016  . Hypokalemia 12/17/2016  . Anemia of chronic disease 12/17/2016  . Acute diastolic CHF (congestive heart failure) ([place]) 03/14/2014  . CHF (congestive heart failure) ([place]) 03/12/2014  . Sarcoidosis ([place]) 03/12/2014  . Severe sepsis ([place]) 10/17/2013  . ARF (acute renal failure) ([place]) 10/08/2013  . Cellulitis of multiple sites of lower extremity 10/08/2013  . Morbid obesity ([place]) 10/08/2013  . Volume depletion 10/08/2013  . Venous (peripheral) insufficiency 10/28/2012  . Mediastinal lymphadenopathy 10/16/2012  . Pulmonary nodules 10/16/2012  .  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 Chest pain 09/02/2012  . Asthma   . Hypertension   . Depression   . Venous stasis ulcers ([place]) 07/29/2012  . Varicose veins of lower extremities with ulcer ([place]) 07/08/2012  . Venous ulcer of leg ([place]) 07/08/2012   Past Medical History:  Diagnosis Date  . Arthritis   . Asthma   . CHF (congestive heart failure) ([place]) 03/12/2014   a. EF  40-45% by echo in 06/2017 with normal cors by cath. ICD placed following VT arrest  . Depression   . Headache(784.0)   . History of blood transfusion   . Hyperlipidemia   . Hypertension   . Lung nodules   . Myocardial infarction ([place]) 06/26/2014  . Renal disorder    followed by [facility]  . Sarcoidosis   . Ulcer    recurring, from chronic venous insufficiency    Family History  Problem Relation Age of Onset  . Heart failure Mother   . Diabetes Mellitus II Mother   . Hypertension Mother   . Cancer Mother        unknown type  . Heart disease Father   . Stroke Father   . Diabetes Mellitus II Father     Past Surgical History:  Procedure Laterality Date  . cataract surgery Left 07-2013  . I&D EXTREMITY Bilateral 12/31/2018   Procedure: DEBRIDEMENT BILATERAL LEGS, APPLY VAC X 2;  Surgeon: [name], MD;  Location: [place];  Service: Orthopedics;  Laterality: Bilateral;  . I&D EXTREMITY Bilateral 01/02/2019   Procedure: REPEAT DEBRIDEMENT BILATERAL LEGS, APPLY VAC X 2;  Surgeon: [name], MD;  Location: [place];  Service: Orthopedics;  Laterality: Bilateral;  . I&D EXTREMITY Bilateral 07/15/2019   Procedure: IRRIGATION AND DEBRIDEMENT VENOUS STASIS INSUFFICIENCY ULCERATIONS BILATERAL LOWER EXTREMITIES;  Surgeon: [name], MD;  Location: [place];  Service: Orthopedics;  Laterality: Bilateral;  . I&D EXTREMITY Bilateral 08/23/2019   Procedure: DEBRIDEMENT EXTREMITY;  Surgeon: [name], MD;  Location: [place];  Service: Orthopedics;  Laterality: Bilateral;  . ICD IMPLANT N/A 07/05/2017   Procedure: ICD Implant;  Surgeon: Constance Haw, MD;  Location: [place] [facility];  Service: Cardiovascular;  Laterality: N/A;  . LEFT HEART CATH AND CORONARY ANGIOGRAPHY N/A 07/03/2017   Procedure: Left Heart Cath and Coronary Angiography;  Surgeon: Belva Crome, MD;  Location: [place] [facility];  Service: Cardiovascular;  Laterality: N/A;  . SKIN SPLIT GRAFT Bilateral  01/07/2019   Procedure: SPLIT THICKNESS SKIN GRAFT BILATERAL LEGS, APPLY VAC;  Surgeon: [name], MD;  Location: [place];  Service: Orthopedics;  Laterality: Bilateral;  . SKIN SPLIT GRAFT Bilateral 07/17/2019   Procedure: REPEAT IRRIGATION AND DEBRIDEMENT BILATERAL LOWER EXTREMITIES, SKIN GRAFT;  Surgeon: [name], MD;  Location: [place];  Service: Orthopedics;  Laterality: Bilateral;  . SKIN SPLIT GRAFT Bilateral 08/26/2019   Procedure: SKIN GRAFT SPLIT THICKNESS BILATERAL LEGS, APPLY WOUND VAC;  Surgeon: [name], MD;  Location: [place];  Service: Orthopedics;  Laterality: Bilateral;   Social History   Occupational History  . Not on file  Tobacco Use  . Smoking status: Never Smoker  . Smokeless tobacco: Never Used  Substance and Sexual Activity  . Alcohol use: No  . Drug use: No  . Sexual activity: Not Currently

## 2019-09-14 DIAGNOSIS — I130 Hypertensive heart and chronic kidney disease with heart failure and stage 1 through stage 4 chronic kidney disease, or unspecified chronic kidney disease: Secondary | ICD-10-CM | POA: Diagnosis not present

## 2019-09-14 DIAGNOSIS — S81809A Unspecified open wound, unspecified lower leg, initial encounter: Secondary | ICD-10-CM | POA: Diagnosis not present

## 2019-09-14 DIAGNOSIS — L97829 Non-pressure chronic ulcer of other part of left lower leg with unspecified severity: Secondary | ICD-10-CM | POA: Diagnosis not present

## 2019-09-14 DIAGNOSIS — I87313 Chronic venous hypertension (idiopathic) with ulcer of bilateral lower extremity: Secondary | ICD-10-CM | POA: Diagnosis not present

## 2019-09-14 DIAGNOSIS — L97819 Non-pressure chronic ulcer of other part of right lower leg with unspecified severity: Secondary | ICD-10-CM | POA: Diagnosis not present

## 2019-09-14 DIAGNOSIS — N183 Chronic kidney disease, stage 3 (moderate): Secondary | ICD-10-CM | POA: Diagnosis not present

## 2019-09-14 DIAGNOSIS — I5033 Acute on chronic diastolic (congestive) heart failure: Secondary | ICD-10-CM | POA: Diagnosis not present

## 2019-09-15 ENCOUNTER — Encounter: Payer: Self-pay | Admitting: Family

## 2019-09-15 ENCOUNTER — Other Ambulatory Visit: Payer: Self-pay

## 2019-09-15 ENCOUNTER — Ambulatory Visit (INDEPENDENT_AMBULATORY_CARE_PROVIDER_SITE_OTHER): Payer: Medicare Other | Admitting: Family

## 2019-09-15 ENCOUNTER — Encounter: Payer: Medicare Other | Admitting: Cardiology

## 2019-09-15 VITALS — Ht 67.0 in | Wt 286.0 lb

## 2019-09-15 DIAGNOSIS — I5033 Acute on chronic diastolic (congestive) heart failure: Secondary | ICD-10-CM | POA: Diagnosis not present

## 2019-09-15 DIAGNOSIS — I87313 Chronic venous hypertension (idiopathic) with ulcer of bilateral lower extremity: Secondary | ICD-10-CM | POA: Diagnosis not present

## 2019-09-15 DIAGNOSIS — L97829 Non-pressure chronic ulcer of other part of left lower leg with unspecified severity: Secondary | ICD-10-CM | POA: Diagnosis not present

## 2019-09-15 DIAGNOSIS — L97819 Non-pressure chronic ulcer of other part of right lower leg with unspecified severity: Secondary | ICD-10-CM | POA: Diagnosis not present

## 2019-09-15 DIAGNOSIS — N183 Chronic kidney disease, stage 3 (moderate): Secondary | ICD-10-CM | POA: Diagnosis not present

## 2019-09-15 DIAGNOSIS — I130 Hypertensive heart and chronic kidney disease with heart failure and stage 1 through stage 4 chronic kidney disease, or unspecified chronic kidney disease: Secondary | ICD-10-CM | POA: Diagnosis not present

## 2019-09-15 DIAGNOSIS — Z945 Skin transplant status: Secondary | ICD-10-CM

## 2019-09-15 NOTE — Progress Notes (Signed)
Post-Op Visit Note   Patient: Kari [name]           Date of Birth: Aug 03, 1961           MRN: [id number] Visit Date: 09/15/2019 PCP: Rutherford Guys, MD  Chief Complaint:  Chief Complaint  Patient presents with  . Right Leg - Routine Post Op    08/26/2019 SGST Bil LE  . Left Leg - Routine Post Op    HPI:  HPI The patient is a 58 year old woman seen today for evaluation of bilateral lower extremities.  She has massive venous stasis swelling with massive ulcerations to the lateral aspects of her legs.  She is status post irrigation debridement with split thickness skin grafting on September 2. Has been in unna and dynaflex. Feels she has been able to walk better.   Completed picc abx on 9/18.  Ortho Exam On examination bilateral lower extremity she has great wrinkling of skin from compressive dressings. Skin grafts in place. There is mild weeping from bilateral wounds. Staples in place.  There is no odor no purulence no erythema  Visit Diagnoses:  1. H/O skin graft     Plan: Vaseline dressings applied to the grafts with an Unna boot and Dynaflex dressings applied to the lower extremities. discussed elevation. she will follow-up in office on Friday. PICC removed today without complication. Dressing applied. Follow-Up Instructions: Return in about 3 days (around 09/18/2019).   Imaging: No results found.  Orders:  No orders of the defined types were placed in this encounter.  No orders of the defined types were placed in this encounter.    PMFS History: Patient Active Problem List   Diagnosis Date Noted  . Multiple open wounds of lower leg   . Moderate protein-calorie malnutrition ([place])   . Non-healing ulcer ([place]) 08/19/2019  . Postoperative pain   . Labile blood pressure   . Nonischemic cardiomyopathy ([place])   . Physical debility 07/20/2019  . Idiopathic chronic venous hypertension of lower extremity with ulcer, bilateral ([place]) 07/15/2019  . H/O skin graft  05/13/2019  . CKD (chronic kidney disease), stage III ([place]) 01/13/2019  . Infected wound 12/26/2018  . Idiopathic chronic venous hypertension of both lower extremities with ulcer and inflammation ([place])   . History of fall 05/10/2018  . Hyperkalemia 02/21/2018  . Hypotension 02/21/2018  . Sepsis ([place]) 02/21/2018  . Encounter for central line placement   . Mitral regurgitation 10/30/2017  . Acute kidney injury ([place]) 07/21/2017  . Generalized weakness 07/20/2017  . Shock circulatory ([place])   . Acute respiratory failure with hypoxia ([place])   . Pulmonary edema   . Cardiac arrest ([place]) 06/26/2017  . Ventricular fibrillation ([place])   . Acute on chronic diastolic heart failure ([place])   . Abnormal ECG   . Cellulitis 04/02/2017  . AKI (acute kidney injury) ([place]) 12/17/2016  . Cellulitis and abscess of right lower extremity 12/17/2016  . Syncope and collapse 12/17/2016  . Peripheral edema 12/17/2016  . Hypokalemia 12/17/2016  . Anemia of chronic disease 12/17/2016  . Acute diastolic CHF (congestive heart failure) ([place]) 03/14/2014  . CHF (congestive heart failure) ([place]) 03/12/2014  . Sarcoidosis ([place]) 03/12/2014  . Severe sepsis ([place]) 10/17/2013  . ARF (acute renal failure) ([place]) 10/08/2013  . Cellulitis of multiple sites of lower extremity 10/08/2013  . Morbid obesity ([place]) 10/08/2013  . Volume depletion 10/08/2013  . Venous (peripheral) insufficiency 10/28/2012  . Mediastinal lymphadenopathy 10/16/2012  . Pulmonary nodules 10/16/2012  . Atherosclerosis  of native arteries of the extremities with ulceration(440.23) 09/30/2012  . Chest pain 09/02/2012  . Asthma   . Hypertension   . Depression   . Venous stasis ulcers ([place]) 07/29/2012  . Varicose veins of lower extremities with ulcer ([place]) 07/08/2012  . Venous ulcer of leg ([place]) 07/08/2012   Past Medical History:  Diagnosis Date  . Arthritis   . Asthma   . CHF (congestive heart failure) ([place]) 03/12/2014   a. EF 40-45% by echo in  06/2017 with normal cors by cath. ICD placed following VT arrest  . Depression   . Headache(784.0)   . History of blood transfusion   . Hyperlipidemia   . Hypertension   . Lung nodules   . Myocardial infarction ([place]) 06/26/2014  . Renal disorder    followed by [facility]  . Sarcoidosis   . Ulcer    recurring, from chronic venous insufficiency    Family History  Problem Relation Age of Onset  . Heart failure Mother   . Diabetes Mellitus II Mother   . Hypertension Mother   . Cancer Mother        unknown type  . Heart disease Father   . Stroke Father   . Diabetes Mellitus II Father     Past Surgical History:  Procedure Laterality Date  . cataract surgery Left 07-2013  . I&D EXTREMITY Bilateral 12/31/2018   Procedure: DEBRIDEMENT BILATERAL LEGS, APPLY VAC X 2;  Surgeon: [name], MD;  Location: [place];  Service: Orthopedics;  Laterality: Bilateral;  . I&D EXTREMITY Bilateral 01/02/2019   Procedure: REPEAT DEBRIDEMENT BILATERAL LEGS, APPLY VAC X 2;  Surgeon: [name], MD;  Location: [place];  Service: Orthopedics;  Laterality: Bilateral;  . I&D EXTREMITY Bilateral 07/15/2019   Procedure: IRRIGATION AND DEBRIDEMENT VENOUS STASIS INSUFFICIENCY ULCERATIONS BILATERAL LOWER EXTREMITIES;  Surgeon: [name], MD;  Location: [place];  Service: Orthopedics;  Laterality: Bilateral;  . I&D EXTREMITY Bilateral 08/23/2019   Procedure: DEBRIDEMENT EXTREMITY;  Surgeon: [name], MD;  Location: [place];  Service: Orthopedics;  Laterality: Bilateral;  . ICD IMPLANT N/A 07/05/2017   Procedure: ICD Implant;  Surgeon: Constance Haw, MD;  Location: [place] [facility];  Service: Cardiovascular;  Laterality: N/A;  . LEFT HEART CATH AND CORONARY ANGIOGRAPHY N/A 07/03/2017   Procedure: Left Heart Cath and Coronary Angiography;  Surgeon: Belva Crome, MD;  Location: [place] [facility];  Service: Cardiovascular;  Laterality: N/A;  . SKIN SPLIT GRAFT Bilateral 01/07/2019   Procedure:  SPLIT THICKNESS SKIN GRAFT BILATERAL LEGS, APPLY VAC;  Surgeon: [name], MD;  Location: [place];  Service: Orthopedics;  Laterality: Bilateral;  . SKIN SPLIT GRAFT Bilateral 07/17/2019   Procedure: REPEAT IRRIGATION AND DEBRIDEMENT BILATERAL LOWER EXTREMITIES, SKIN GRAFT;  Surgeon: [name], MD;  Location: [place];  Service: Orthopedics;  Laterality: Bilateral;  . SKIN SPLIT GRAFT Bilateral 08/26/2019   Procedure: SKIN GRAFT SPLIT THICKNESS BILATERAL LEGS, APPLY WOUND VAC;  Surgeon: [name], MD;  Location: [place];  Service: Orthopedics;  Laterality: Bilateral;   Social History   Occupational History  . Not on file  Tobacco Use  . Smoking status: Never Smoker  . Smokeless tobacco: Never Used  Substance and Sexual Activity  . Alcohol use: No  . Drug use: No  . Sexual activity: Not Currently

## 2019-09-16 ENCOUNTER — Encounter: Payer: Medicare Other | Attending: Registered Nurse | Admitting: Registered Nurse

## 2019-09-18 ENCOUNTER — Ambulatory Visit (INDEPENDENT_AMBULATORY_CARE_PROVIDER_SITE_OTHER): Payer: Medicare Other | Admitting: Family

## 2019-09-18 ENCOUNTER — Encounter: Payer: Self-pay | Admitting: Family

## 2019-09-18 ENCOUNTER — Telehealth: Payer: Self-pay

## 2019-09-18 VITALS — Ht 67.0 in | Wt 286.0 lb

## 2019-09-18 DIAGNOSIS — I872 Venous insufficiency (chronic) (peripheral): Secondary | ICD-10-CM

## 2019-09-18 DIAGNOSIS — Z945 Skin transplant status: Secondary | ICD-10-CM

## 2019-09-19 DIAGNOSIS — I5033 Acute on chronic diastolic (congestive) heart failure: Secondary | ICD-10-CM | POA: Diagnosis not present

## 2019-09-19 DIAGNOSIS — L97829 Non-pressure chronic ulcer of other part of left lower leg with unspecified severity: Secondary | ICD-10-CM | POA: Diagnosis not present

## 2019-09-19 DIAGNOSIS — L97819 Non-pressure chronic ulcer of other part of right lower leg with unspecified severity: Secondary | ICD-10-CM | POA: Diagnosis not present

## 2019-09-19 DIAGNOSIS — I130 Hypertensive heart and chronic kidney disease with heart failure and stage 1 through stage 4 chronic kidney disease, or unspecified chronic kidney disease: Secondary | ICD-10-CM | POA: Diagnosis not present

## 2019-09-19 DIAGNOSIS — I87313 Chronic venous hypertension (idiopathic) with ulcer of bilateral lower extremity: Secondary | ICD-10-CM | POA: Diagnosis not present

## 2019-09-19 DIAGNOSIS — N183 Chronic kidney disease, stage 3 (moderate): Secondary | ICD-10-CM | POA: Diagnosis not present

## 2019-09-21 DIAGNOSIS — I130 Hypertensive heart and chronic kidney disease with heart failure and stage 1 through stage 4 chronic kidney disease, or unspecified chronic kidney disease: Secondary | ICD-10-CM | POA: Diagnosis not present

## 2019-09-21 DIAGNOSIS — I5033 Acute on chronic diastolic (congestive) heart failure: Secondary | ICD-10-CM | POA: Diagnosis not present

## 2019-09-21 DIAGNOSIS — I87313 Chronic venous hypertension (idiopathic) with ulcer of bilateral lower extremity: Secondary | ICD-10-CM | POA: Diagnosis not present

## 2019-09-21 DIAGNOSIS — L97819 Non-pressure chronic ulcer of other part of right lower leg with unspecified severity: Secondary | ICD-10-CM | POA: Diagnosis not present

## 2019-09-21 DIAGNOSIS — L97829 Non-pressure chronic ulcer of other part of left lower leg with unspecified severity: Secondary | ICD-10-CM | POA: Diagnosis not present

## 2019-09-21 DIAGNOSIS — N183 Chronic kidney disease, stage 3 (moderate): Secondary | ICD-10-CM | POA: Diagnosis not present

## 2019-09-22 ENCOUNTER — Ambulatory Visit (INDEPENDENT_AMBULATORY_CARE_PROVIDER_SITE_OTHER): Payer: Medicare Other | Admitting: Family

## 2019-09-22 ENCOUNTER — Inpatient Hospital Stay: Payer: Medicare Other | Admitting: Family Medicine

## 2019-09-22 ENCOUNTER — Encounter: Payer: Self-pay | Admitting: Family

## 2019-09-22 VITALS — Ht 67.0 in | Wt 286.0 lb

## 2019-09-22 DIAGNOSIS — Z945 Skin transplant status: Secondary | ICD-10-CM

## 2019-09-22 NOTE — Progress Notes (Signed)
Post-Op Visit Note   Patient: Kari [name]           Date of Birth: 26-Jun-1961           MRN: [id number] Visit Date: 09/22/2019 PCP: Rutherford Guys, MD  Chief Complaint:  Chief Complaint  Patient presents with  . Right Leg - Routine Post Op    08/26/2019 BLE STSG  . Left Leg - Routine Post Op    HPI:  HPI The patient is a 58 year old woman seen today for evaluation of bilateral lower extremities status post split thickness skin grafting bilaterally.  She has excellent resolution of swelling beneath her compression garments. Has been in unna and dynaflex. Feels she has been able to walk better.    Ortho Exam On examination bilateral lower extremity she has great wrinkling of skin from compressive dressings. Unfortunately these did roll down and proximal shins are edematous. Skin grafts in place. There is scant bleeding from bilateral wounds. Staples in place.  There is no odor no purulence no erythema  Visit Diagnoses:  1. S/P split thickness skin graft     Plan: Silvercell dressings applied to the grafts with an Unna boot and Dynaflex dressings applied to the lower extremities. Discussed elevation. She will follow-up in office on Friday for dressing change.   Follow-Up Instructions: Return in about 1 week (around 09/29/2019).   Imaging: No results found.  Orders:  No orders of the defined types were placed in this encounter.  No orders of the defined types were placed in this encounter.    PMFS History: Patient Active Problem List   Diagnosis Date Noted  . Multiple open wounds of lower leg   . Moderate protein-calorie malnutrition ([place])   . Non-healing ulcer ([place]) 08/19/2019  . Postoperative pain   . Labile blood pressure   . Nonischemic cardiomyopathy ([place])   . Physical debility 07/20/2019  . Idiopathic chronic venous hypertension of lower extremity with ulcer, bilateral ([place]) 07/15/2019  . H/O skin graft 05/13/2019  . CKD (chronic kidney disease), stage  III ([place]) 01/13/2019  . Infected wound 12/26/2018  . Idiopathic chronic venous hypertension of both lower extremities with ulcer and inflammation ([place])   . History of fall 05/10/2018  . Hyperkalemia 02/21/2018  . Hypotension 02/21/2018  . Sepsis ([place]) 02/21/2018  . Encounter for central line placement   . Mitral regurgitation 10/30/2017  . Acute kidney injury ([place]) 07/21/2017  . Generalized weakness 07/20/2017  . Shock circulatory ([place])   . Acute respiratory failure with hypoxia ([place])   . Pulmonary edema   . Cardiac arrest ([place]) 06/26/2017  . Ventricular fibrillation ([place])   . Acute on chronic diastolic heart failure ([place])   . Abnormal ECG   . Cellulitis 04/02/2017  . AKI (acute kidney injury) ([place]) 12/17/2016  . Cellulitis and abscess of right lower extremity 12/17/2016  . Syncope and collapse 12/17/2016  . Peripheral edema 12/17/2016  . Hypokalemia 12/17/2016  . Anemia of chronic disease 12/17/2016  . Acute diastolic CHF (congestive heart failure) ([place]) 03/14/2014  . CHF (congestive heart failure) ([place]) 03/12/2014  . Sarcoidosis ([place]) 03/12/2014  . Severe sepsis ([place]) 10/17/2013  . ARF (acute renal failure) ([place]) 10/08/2013  . Cellulitis of multiple sites of lower extremity 10/08/2013  . Morbid obesity ([place]) 10/08/2013  . Volume depletion 10/08/2013  . Venous (peripheral) insufficiency 10/28/2012  . Mediastinal lymphadenopathy 10/16/2012  . Pulmonary nodules 10/16/2012  . 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  Chest pain 09/02/2012  . Asthma   . Hypertension   . Depression   . Venous stasis ulcers ([place]) 07/29/2012  . Varicose veins of lower extremities with ulcer ([place]) 07/08/2012  . Venous ulcer of leg ([place]) 07/08/2012   Past Medical History:  Diagnosis Date  . Arthritis   . Asthma   . CHF (congestive heart failure) ([place]) 03/12/2014   a. EF 40-45% by echo in 06/2017 with normal cors by cath. ICD placed following  VT arrest  . Depression   . Headache(784.0)   . History of blood transfusion   . Hyperlipidemia   . Hypertension   . Lung nodules   . Myocardial infarction ([place]) 06/26/2014  . Renal disorder    followed by [facility]  . Sarcoidosis   . Ulcer    recurring, from chronic venous insufficiency    Family History  Problem Relation Age of Onset  . Heart failure Mother   . Diabetes Mellitus II Mother   . Hypertension Mother   . Cancer Mother        unknown type  . Heart disease Father   . Stroke Father   . Diabetes Mellitus II Father     Past Surgical History:  Procedure Laterality Date  . cataract surgery Left 07-2013  . I&D EXTREMITY Bilateral 12/31/2018   Procedure: DEBRIDEMENT BILATERAL LEGS, APPLY VAC X 2;  Surgeon: [name], MD;  Location: [place];  Service: Orthopedics;  Laterality: Bilateral;  . I&D EXTREMITY Bilateral 01/02/2019   Procedure: REPEAT DEBRIDEMENT BILATERAL LEGS, APPLY VAC X 2;  Surgeon: [name], MD;  Location: [place];  Service: Orthopedics;  Laterality: Bilateral;  . I&D EXTREMITY Bilateral 07/15/2019   Procedure: IRRIGATION AND DEBRIDEMENT VENOUS STASIS INSUFFICIENCY ULCERATIONS BILATERAL LOWER EXTREMITIES;  Surgeon: [name], MD;  Location: [place];  Service: Orthopedics;  Laterality: Bilateral;  . I&D EXTREMITY Bilateral 08/23/2019   Procedure: DEBRIDEMENT EXTREMITY;  Surgeon: [name], MD;  Location: [place];  Service: Orthopedics;  Laterality: Bilateral;  . ICD IMPLANT N/A 07/05/2017   Procedure: ICD Implant;  Surgeon: Constance Haw, MD;  Location: [place] [facility];  Service: Cardiovascular;  Laterality: N/A;  . LEFT HEART CATH AND CORONARY ANGIOGRAPHY N/A 07/03/2017   Procedure: Left Heart Cath and Coronary Angiography;  Surgeon: Belva Crome, MD;  Location: [place] [facility];  Service: Cardiovascular;  Laterality: N/A;  . SKIN SPLIT GRAFT Bilateral 01/07/2019   Procedure: SPLIT THICKNESS SKIN GRAFT BILATERAL LEGS, APPLY VAC;   Surgeon: [name], MD;  Location: [place];  Service: Orthopedics;  Laterality: Bilateral;  . SKIN SPLIT GRAFT Bilateral 07/17/2019   Procedure: REPEAT IRRIGATION AND DEBRIDEMENT BILATERAL LOWER EXTREMITIES, SKIN GRAFT;  Surgeon: [name], MD;  Location: [place];  Service: Orthopedics;  Laterality: Bilateral;  . SKIN SPLIT GRAFT Bilateral 08/26/2019   Procedure: SKIN GRAFT SPLIT THICKNESS BILATERAL LEGS, APPLY WOUND VAC;  Surgeon: [name], MD;  Location: [place];  Service: Orthopedics;  Laterality: Bilateral;   Social History   Occupational History  . Not on file  Tobacco Use  . Smoking status: Never Smoker  . Smokeless tobacco: Never Used  Substance and Sexual Activity  . Alcohol use: No  . Drug use: No  . Sexual activity: Not Currently

## 2019-09-22 NOTE — Progress Notes (Signed)
Post-Op Visit Note   Patient: Kari [name]           Date of Birth: July 11, 1961           MRN: [id number] Visit Date: 09/18/2019 PCP: Rutherford Guys, MD  Chief Complaint:  Chief Complaint  Patient presents with  . Right Leg - Routine Post Op    08/26/19 stsg BLE  . Left Leg - Routine Post Op    HPI:  HPI The patient is a 58 year old woman who presents today status post bilateral split thickness skin grafting to her bilateral lower extremities on September 2.  Has been in a Unna and Dynaflex wraps with silver cell dressing since last visit.  Currently doing this twice a week. Ortho Exam On examination there is great uptake of the graft material there is granulation however there is some thin fibrinous tissue this can be debrided easily with gauze there is no surrounding erythema no odor no sign of infection  Visit Diagnoses: No diagnosis found.  Plan: We will continue with silver cell dressings Unna and Dynaflex wraps discussed importance of elevation follow-up in office in early next week  Follow-Up Instructions: No follow-ups on file.   Imaging: No results found.  Orders:  No orders of the defined types were placed in this encounter.  No orders of the defined types were placed in this encounter.    PMFS History: Patient Active Problem List   Diagnosis Date Noted  . Multiple open wounds of lower leg   . Moderate protein-calorie malnutrition ([place])   . Non-healing ulcer ([place]) 08/19/2019  . Postoperative pain   . Labile blood pressure   . Nonischemic cardiomyopathy ([place])   . Physical debility 07/20/2019  . Idiopathic chronic venous hypertension of lower extremity with ulcer, bilateral ([place]) 07/15/2019  . H/O skin graft 05/13/2019  . CKD (chronic kidney disease), stage III ([place]) 01/13/2019  . Infected wound 12/26/2018  . Idiopathic chronic venous hypertension of both lower extremities with ulcer and inflammation ([place])   . History of fall 05/10/2018  .  Hyperkalemia 02/21/2018  . Hypotension 02/21/2018  . Sepsis ([place]) 02/21/2018  . Encounter for central line placement   . Mitral regurgitation 10/30/2017  . Acute kidney injury ([place]) 07/21/2017  . Generalized weakness 07/20/2017  . Shock circulatory ([place])   . Acute respiratory failure with hypoxia ([place])   . Pulmonary edema   . Cardiac arrest ([place]) 06/26/2017  . Ventricular fibrillation ([place])   . Acute on chronic diastolic heart failure ([place])   . Abnormal ECG   . Cellulitis 04/02/2017  . AKI (acute kidney injury) ([place]) 12/17/2016  . Cellulitis and abscess of right lower extremity 12/17/2016  . Syncope and collapse 12/17/2016  . Peripheral edema 12/17/2016  . Hypokalemia 12/17/2016  . Anemia of chronic disease 12/17/2016  . Acute diastolic CHF (congestive heart failure) ([place]) 03/14/2014  . CHF (congestive heart failure) ([place]) 03/12/2014  . Sarcoidosis ([employer]) 03/12/2014  . Severe sepsis ([place]) 10/17/2013  . ARF (acute renal failure) ([place]) 10/08/2013  . Cellulitis of multiple sites of lower extremity 10/08/2013  . Morbid obesity ([place]) 10/08/2013  . Volume depletion 10/08/2013  . Venous (peripheral) insufficiency 10/28/2012  . Mediastinal lymphadenopathy 10/16/2012  . Pulmonary nodules 10/16/2012  . 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 Chest pain 09/02/2012  . Asthma   . Hypertension   . Depression   . Venous stasis ulcers ([place]) 07/29/2012  . Varicose veins of lower extremities  with ulcer ([place]) 07/08/2012  . Venous ulcer of leg ([place]) 07/08/2012   Past Medical History:  Diagnosis Date  . Arthritis   . Asthma   . CHF (congestive heart failure) ([place]) 03/12/2014   a. EF 40-45% by echo in 06/2017 with normal cors by cath. ICD placed following VT arrest  . Depression   . Headache(784.0)   . History of blood transfusion   . Hyperlipidemia   . Hypertension   . Lung nodules   . Myocardial infarction ([place]) 06/26/2014  . Renal  disorder    followed by [facility]  . Sarcoidosis   . Ulcer    recurring, from chronic venous insufficiency    Family History  Problem Relation Age of Onset  . Heart failure Mother   . Diabetes Mellitus II Mother   . Hypertension Mother   . Cancer Mother        unknown type  . Heart disease Father   . Stroke Father   . Diabetes Mellitus II Father     Past Surgical History:  Procedure Laterality Date  . cataract surgery Left 07-2013  . I&D EXTREMITY Bilateral 12/31/2018   Procedure: DEBRIDEMENT BILATERAL LEGS, APPLY VAC X 2;  Surgeon: [name], MD;  Location: [facility];  Service: Orthopedics;  Laterality: Bilateral;  . I&D EXTREMITY Bilateral 01/02/2019   Procedure: REPEAT DEBRIDEMENT BILATERAL LEGS, APPLY VAC X 2;  Surgeon: [name], MD;  Location: [facility];  Service: Orthopedics;  Laterality: Bilateral;  . I&D EXTREMITY Bilateral 07/15/2019   Procedure: IRRIGATION AND DEBRIDEMENT VENOUS STASIS INSUFFICIENCY ULCERATIONS BILATERAL LOWER EXTREMITIES;  Surgeon: [name], MD;  Location: [facility];  Service: Orthopedics;  Laterality: Bilateral;  . I&D EXTREMITY Bilateral 08/23/2019   Procedure: DEBRIDEMENT EXTREMITY;  Surgeon: [name], MD;  Location: [facility];  Service: Orthopedics;  Laterality: Bilateral;  . ICD IMPLANT N/A 07/05/2017   Procedure: ICD Implant;  Surgeon: Constance Haw, MD;  Location: [facility];  Service: Cardiovascular;  Laterality: N/A;  . LEFT HEART CATH AND CORONARY ANGIOGRAPHY N/A 07/03/2017   Procedure: Left Heart Cath and Coronary Angiography;  Surgeon: Belva Crome, MD;  Location: [facility];  Service: Cardiovascular;  Laterality: N/A;  . SKIN SPLIT GRAFT Bilateral 01/07/2019   Procedure: SPLIT THICKNESS SKIN GRAFT BILATERAL LEGS, APPLY VAC;  Surgeon: [name], MD;  Location: [facility];  Service: Orthopedics;  Laterality: Bilateral;  . SKIN SPLIT GRAFT Bilateral 07/17/2019   Procedure: REPEAT IRRIGATION AND DEBRIDEMENT BILATERAL  LOWER EXTREMITIES, SKIN GRAFT;  Surgeon: [name], MD;  Location: [facility];  Service: Orthopedics;  Laterality: Bilateral;  . SKIN SPLIT GRAFT Bilateral 08/26/2019   Procedure: SKIN GRAFT SPLIT THICKNESS BILATERAL LEGS, APPLY WOUND VAC;  Surgeon: [name], MD;  Location: [facility];  Service: Orthopedics;  Laterality: Bilateral;   Social History   Occupational History  . Not on file  Tobacco Use  . Smoking status: Never Smoker  . Smokeless tobacco: Never Used  Substance and Sexual Activity  . Alcohol use: No  . Drug use: No  . Sexual activity: Not Currently

## 2019-09-23 DIAGNOSIS — I130 Hypertensive heart and chronic kidney disease with heart failure and stage 1 through stage 4 chronic kidney disease, or unspecified chronic kidney disease: Secondary | ICD-10-CM | POA: Diagnosis not present

## 2019-09-23 DIAGNOSIS — I87313 Chronic venous hypertension (idiopathic) with ulcer of bilateral lower extremity: Secondary | ICD-10-CM | POA: Diagnosis not present

## 2019-09-23 DIAGNOSIS — I5033 Acute on chronic diastolic (congestive) heart failure: Secondary | ICD-10-CM | POA: Diagnosis not present

## 2019-09-23 DIAGNOSIS — N183 Chronic kidney disease, stage 3 (moderate): Secondary | ICD-10-CM | POA: Diagnosis not present

## 2019-09-23 DIAGNOSIS — L97819 Non-pressure chronic ulcer of other part of right lower leg with unspecified severity: Secondary | ICD-10-CM | POA: Diagnosis not present

## 2019-09-23 DIAGNOSIS — L97829 Non-pressure chronic ulcer of other part of left lower leg with unspecified severity: Secondary | ICD-10-CM | POA: Diagnosis not present

## 2019-09-24 DIAGNOSIS — M1990 Unspecified osteoarthritis, unspecified site: Secondary | ICD-10-CM | POA: Diagnosis not present

## 2019-09-24 DIAGNOSIS — F329 Major depressive disorder, single episode, unspecified: Secondary | ICD-10-CM | POA: Diagnosis not present

## 2019-09-24 DIAGNOSIS — I96 Gangrene, not elsewhere classified: Secondary | ICD-10-CM | POA: Diagnosis not present

## 2019-09-24 DIAGNOSIS — I252 Old myocardial infarction: Secondary | ICD-10-CM | POA: Diagnosis not present

## 2019-09-24 DIAGNOSIS — L97819 Non-pressure chronic ulcer of other part of right lower leg with unspecified severity: Secondary | ICD-10-CM | POA: Diagnosis not present

## 2019-09-24 DIAGNOSIS — D631 Anemia in chronic kidney disease: Secondary | ICD-10-CM | POA: Diagnosis not present

## 2019-09-24 DIAGNOSIS — Z6841 Body Mass Index (BMI) 40.0 and over, adult: Secondary | ICD-10-CM | POA: Diagnosis not present

## 2019-09-24 DIAGNOSIS — I428 Other cardiomyopathies: Secondary | ICD-10-CM | POA: Diagnosis not present

## 2019-09-24 DIAGNOSIS — B964 Proteus (mirabilis) (morganii) as the cause of diseases classified elsewhere: Secondary | ICD-10-CM | POA: Diagnosis not present

## 2019-09-24 DIAGNOSIS — J45909 Unspecified asthma, uncomplicated: Secondary | ICD-10-CM | POA: Diagnosis not present

## 2019-09-24 DIAGNOSIS — L97829 Non-pressure chronic ulcer of other part of left lower leg with unspecified severity: Secondary | ICD-10-CM | POA: Diagnosis not present

## 2019-09-24 DIAGNOSIS — E785 Hyperlipidemia, unspecified: Secondary | ICD-10-CM | POA: Diagnosis not present

## 2019-09-24 DIAGNOSIS — Z95810 Presence of automatic (implantable) cardiac defibrillator: Secondary | ICD-10-CM | POA: Diagnosis not present

## 2019-09-24 DIAGNOSIS — B965 Pseudomonas (aeruginosa) (mallei) (pseudomallei) as the cause of diseases classified elsewhere: Secondary | ICD-10-CM | POA: Diagnosis not present

## 2019-09-24 DIAGNOSIS — R911 Solitary pulmonary nodule: Secondary | ICD-10-CM | POA: Diagnosis not present

## 2019-09-24 DIAGNOSIS — D869 Sarcoidosis, unspecified: Secondary | ICD-10-CM | POA: Diagnosis not present

## 2019-09-24 DIAGNOSIS — I130 Hypertensive heart and chronic kidney disease with heart failure and stage 1 through stage 4 chronic kidney disease, or unspecified chronic kidney disease: Secondary | ICD-10-CM | POA: Diagnosis not present

## 2019-09-24 DIAGNOSIS — Z7982 Long term (current) use of aspirin: Secondary | ICD-10-CM | POA: Diagnosis not present

## 2019-09-24 DIAGNOSIS — T8149XA Infection following a procedure, other surgical site, initial encounter: Secondary | ICD-10-CM | POA: Diagnosis not present

## 2019-09-24 DIAGNOSIS — E440 Moderate protein-calorie malnutrition: Secondary | ICD-10-CM | POA: Diagnosis not present

## 2019-09-24 DIAGNOSIS — T8131XA Disruption of external operation (surgical) wound, not elsewhere classified, initial encounter: Secondary | ICD-10-CM | POA: Diagnosis not present

## 2019-09-24 DIAGNOSIS — N183 Chronic kidney disease, stage 3 unspecified: Secondary | ICD-10-CM | POA: Diagnosis not present

## 2019-09-24 DIAGNOSIS — I5033 Acute on chronic diastolic (congestive) heart failure: Secondary | ICD-10-CM | POA: Diagnosis not present

## 2019-09-24 DIAGNOSIS — I87313 Chronic venous hypertension (idiopathic) with ulcer of bilateral lower extremity: Secondary | ICD-10-CM | POA: Diagnosis not present

## 2019-09-25 ENCOUNTER — Ambulatory Visit: Payer: Medicare Other

## 2019-09-25 ENCOUNTER — Ambulatory Visit: Payer: Medicare Other | Admitting: Family

## 2019-09-25 ENCOUNTER — Telehealth: Payer: Self-pay | Admitting: Orthopedic Surgery

## 2019-09-25 VITALS — Ht 67.0 in | Wt 286.0 lb

## 2019-09-25 DIAGNOSIS — Z945 Skin transplant status: Secondary | ICD-10-CM

## 2019-09-25 NOTE — Telephone Encounter (Signed)
Kari [name] with [facility] request verbal orders  2 times a week for 4 weeks 1 time a week for 4 weeks [name]'s call back # [phone]

## 2019-09-25 NOTE — Progress Notes (Signed)
Patient was in the office today to have bilateral lower extremity unna and dynaflex dressings removed.  Swelling in both legs have decreased there is a moderate amount of drainage. Silvercell applied to bilateral grafts, ABD pads, unna and Dynaflex. Pt will continue to elevate legs higher than heart and come in for dressing change on Tuesday and Friday of next week. Voiced understanding and had no further questions.   Autumn Forrest, [place], [employer]

## 2019-09-25 NOTE — Telephone Encounter (Signed)
Terri Piedra was called and given verbal okay for patient Kari [name].

## 2019-09-28 ENCOUNTER — Encounter: Payer: Self-pay | Admitting: Family Medicine

## 2019-09-28 ENCOUNTER — Ambulatory Visit (INDEPENDENT_AMBULATORY_CARE_PROVIDER_SITE_OTHER): Payer: Medicare Other | Admitting: Family Medicine

## 2019-09-28 ENCOUNTER — Encounter: Payer: Self-pay | Admitting: Cardiology

## 2019-09-28 ENCOUNTER — Ambulatory Visit (INDEPENDENT_AMBULATORY_CARE_PROVIDER_SITE_OTHER): Payer: Medicare Other | Admitting: Cardiology

## 2019-09-28 ENCOUNTER — Other Ambulatory Visit: Payer: Self-pay

## 2019-09-28 VITALS — BP 103/68 | HR 79 | Temp 98.7°F | Ht 67.0 in | Wt 281.0 lb

## 2019-09-28 VITALS — BP 102/68 | HR 73 | Ht 67.0 in | Wt 282.4 lb

## 2019-09-28 DIAGNOSIS — Z945 Skin transplant status: Secondary | ICD-10-CM | POA: Diagnosis not present

## 2019-09-28 DIAGNOSIS — I428 Other cardiomyopathies: Secondary | ICD-10-CM

## 2019-09-28 DIAGNOSIS — B964 Proteus (mirabilis) (morganii) as the cause of diseases classified elsewhere: Secondary | ICD-10-CM | POA: Diagnosis not present

## 2019-09-28 DIAGNOSIS — B965 Pseudomonas (aeruginosa) (mallei) (pseudomallei) as the cause of diseases classified elsewhere: Secondary | ICD-10-CM | POA: Diagnosis not present

## 2019-09-28 DIAGNOSIS — I87313 Chronic venous hypertension (idiopathic) with ulcer of bilateral lower extremity: Secondary | ICD-10-CM | POA: Diagnosis not present

## 2019-09-28 DIAGNOSIS — S81809D Unspecified open wound, unspecified lower leg, subsequent encounter: Secondary | ICD-10-CM | POA: Diagnosis not present

## 2019-09-28 DIAGNOSIS — T8149XA Infection following a procedure, other surgical site, initial encounter: Secondary | ICD-10-CM | POA: Diagnosis not present

## 2019-09-28 DIAGNOSIS — Z23 Encounter for immunization: Secondary | ICD-10-CM | POA: Diagnosis not present

## 2019-09-28 DIAGNOSIS — I96 Gangrene, not elsewhere classified: Secondary | ICD-10-CM | POA: Diagnosis not present

## 2019-09-28 DIAGNOSIS — I10 Essential (primary) hypertension: Secondary | ICD-10-CM | POA: Diagnosis not present

## 2019-09-28 DIAGNOSIS — T8131XA Disruption of external operation (surgical) wound, not elsewhere classified, initial encounter: Secondary | ICD-10-CM | POA: Diagnosis not present

## 2019-09-28 MED ORDER — HYDROCODONE-ACETAMINOPHEN 5-325 MG PO TABS: 1.0000 | ORAL_TABLET | Freq: Four times a day (QID) | ORAL | 0 refills | Status: DC | PRN

## 2019-09-28 NOTE — Progress Notes (Signed)
10/5/20204:27 PM  Kari [name] [birth year], 58 y.o., female [id number]  Chief Complaint  Patient presents with  . Follow-up    skin drafts, says legs feel pretty good. Wants to know if she scan take the flu?  . Medication Refill    potassium, and asking for the hydrocodone. The oxy is too strong    HPI:   Patient is a 58 y.o. female with past medical history significant for s/p cardiac arrest in 2018 now with ACID,nonsichemic cardiomyopathy, HTN, venous stasis ulcers s/p skin grafts, prediabeteswho presents today for routine followup  Last OV Feb 2020 hosp aug 2020 for cellulitis requiring debridements of wounds, wound vac placed, d/c to inpatient rehab Also found to have AKI and hypokalemia Followed very close by Dr Sharol Given - does wound care  Overall doing well Requesting pain med changed to vicodin pmp reviewed Last percocet refilled a month ago Fluid pills were stopped in hosp for AKI Left heart cath 2018, LVEF 35-40% Patient denies any edema, increase weight, SOB, orthopnea or PND Saw EP this morning - pacemaker ok Sees cards late nov   Wt Readings from Last 3 Encounters:  09/28/19 281 lb (127.5 kg)  09/28/19 282 lb 6.4 oz (128.1 kg)  09/25/19 286 lb (129.7 kg)    Lab Results  Component Value Date   CREATININE 0.90 08/31/2019   BUN 9 08/31/2019   NA 139 08/31/2019   K 3.7 08/31/2019   CL 105 08/31/2019   CO2 27 08/31/2019   Lab Results  Component Value Date   HGBA1C 5.8 (H) 08/20/2019    Depression screen PHQ 2/9 09/28/2019 05/13/2019 02/11/2019  Decreased Interest 0 0 0  Down, Depressed, Hopeless 0 0 0  PHQ - 2 Score 0 0 0  Altered sleeping - - -  Tired, decreased energy - - -  Change in appetite - - -  Feeling bad or failure about yourself  - - -  Trouble concentrating - - -  Moving slowly or fidgety/restless - - -  Suicidal thoughts - - -  PHQ-9 Score - - -  Difficult doing work/chores - - -  Some recent data might be hidden    Fall Risk   09/28/2019 05/13/2019 02/11/2019 09/11/2018 05/10/2018  Falls in the past year? 0 0 0 No Yes  Number falls in past yr: 0 - - - 2 or more  Comment - - - - -  Injury with Fall? 0 0 0 - No  Comment - - - - -  Risk for fall due to : - - - - -  Risk for fall due to: Comment - - - - -  Follow up - - - - -     Allergies  Allergen Reactions  . Tramadol Other (See Comments)    hallucination    Prior to Admission medications   Medication Sig Start Date End Date Taking? Authorizing Provider  acetaminophen (TYLENOL) 325 MG tablet Take 1-2 tablets (325-650 mg total) by mouth every 4 (four) hours as needed for mild pain. 07/22/19  Yes Love, Ivan Anchors, PA-C  albuterol (PROVENTIL HFA;VENTOLIN HFA) 108 (90 Base) MCG/ACT inhaler Inhale 2 puffs into the lungs every 6 (six) hours as needed for shortness of breath. 02/02/19  Yes Medina-Vargas, Monina C, NP  allopurinol (ZYLOPRIM) 100 MG tablet Take 2 tablets (200 mg total) by mouth daily. 05/13/19  Yes Rutherford Guys, MD  aspirin EC 81 MG tablet Take 81 mg by mouth daily.   Yes  [provider]  carvedilol (COREG) 6.25 MG tablet Take 1 tablet (6.25 mg total) by mouth 2 (two) times daily. 02/02/19  Yes Medina-Vargas, Monina C, NP  polyethylene glycol (MIRALAX / GLYCOLAX) 17 g packet Take 17 g by mouth daily as needed. 09/03/19  Yes Love, Ivan Anchors, PA-C  potassium chloride (K-DUR) 10 MEQ tablet Take 30 mEq by mouth daily. 07/25/19  Yes [provider]  senna-docusate (SENOKOT-S) 8.6-50 MG tablet Take 2 tablets by mouth 2 (two) times daily. 09/03/19  Yes Love, Ivan Anchors, PA-C    Past Medical History:  Diagnosis Date  . Arthritis   . Asthma   . CHF (congestive heart failure) ([place]) 03/12/2014   a. EF 40-45% by echo in 06/2017 with normal cors by cath. ICD placed following VT arrest  . Depression   . Headache(784.0)   . History of blood transfusion   . Hyperlipidemia   . Hypertension   . Lung nodules   . Myocardial infarction ([place]) 06/26/2014  .  Renal disorder    followed by [facility]  . Sarcoidosis   . Ulcer    recurring, from chronic venous insufficiency    Past Surgical History:  Procedure Laterality Date  . cataract surgery Left 07-2013  . I&D EXTREMITY Bilateral 12/31/2018   Procedure: DEBRIDEMENT BILATERAL LEGS, APPLY VAC X 2;  Surgeon: [name], MD;  Location: [facility];  Service: Orthopedics;  Laterality: Bilateral;  . I&D EXTREMITY Bilateral 01/02/2019   Procedure: REPEAT DEBRIDEMENT BILATERAL LEGS, APPLY VAC X 2;  Surgeon: [name], MD;  Location: [facility];  Service: Orthopedics;  Laterality: Bilateral;  . I&D EXTREMITY Bilateral 07/15/2019   Procedure: IRRIGATION AND DEBRIDEMENT VENOUS STASIS INSUFFICIENCY ULCERATIONS BILATERAL LOWER EXTREMITIES;  Surgeon: [name], MD;  Location: [facility];  Service: Orthopedics;  Laterality: Bilateral;  . I&D EXTREMITY Bilateral 08/23/2019   Procedure: DEBRIDEMENT EXTREMITY;  Surgeon: [name], MD;  Location: [facility];  Service: Orthopedics;  Laterality: Bilateral;  . ICD IMPLANT N/A 07/05/2017   Procedure: ICD Implant;  Surgeon: Constance Haw, MD;  Location: [facility];  Service: Cardiovascular;  Laterality: N/A;  . LEFT HEART CATH AND CORONARY ANGIOGRAPHY N/A 07/03/2017   Procedure: Left Heart Cath and Coronary Angiography;  Surgeon: Belva Crome, MD;  Location: [facility];  Service: Cardiovascular;  Laterality: N/A;  . SKIN SPLIT GRAFT Bilateral 01/07/2019   Procedure: SPLIT THICKNESS SKIN GRAFT BILATERAL LEGS, APPLY VAC;  Surgeon: [name], MD;  Location: [facility];  Service: Orthopedics;  Laterality: Bilateral;  . SKIN SPLIT GRAFT Bilateral 07/17/2019   Procedure: REPEAT IRRIGATION AND DEBRIDEMENT BILATERAL LOWER EXTREMITIES, SKIN GRAFT;  Surgeon: [name], MD;  Location: [facility];  Service: Orthopedics;  Laterality: Bilateral;  . SKIN SPLIT GRAFT Bilateral 08/26/2019   Procedure: SKIN GRAFT SPLIT THICKNESS BILATERAL LEGS, APPLY WOUND VAC;   Surgeon: [name], MD;  Location: [facility];  Service: Orthopedics;  Laterality: Bilateral;    Social History   Tobacco Use  . Smoking status: Never Smoker  . Smokeless tobacco: Never Used  Substance Use Topics  . Alcohol use: No    Family History  Problem Relation Age of Onset  . Heart failure Mother   . Diabetes Mellitus II Mother   . Hypertension Mother   . Cancer Mother        unknown type  . Heart disease Father   . Stroke Father   . Diabetes Mellitus II  Father     Review of Systems  Constitutional: Negative for chills and fever.  Respiratory: Negative for cough and shortness of breath.   Cardiovascular: Negative for chest pain, palpitations and leg swelling.  Gastrointestinal: Negative for abdominal pain, nausea and vomiting.     OBJECTIVE:  Today's Vitals   09/28/19 1618  BP: 103/68  Pulse: 79  Temp: 98.7 F (37.1 C)  SpO2: 96%  Weight: 281 lb (127.5 kg)  Height: 5\' 7"  (1.702 m)   Body mass index is 44.01 kg/m.   Physical Exam Vitals signs and nursing note reviewed.  Constitutional:      Appearance: She is well-developed.  HENT:     Head: Normocephalic and atraumatic.     Mouth/Throat:     Pharynx: No oropharyngeal exudate.  Eyes:     General: No scleral icterus.    Conjunctiva/sclera: Conjunctivae normal.     Pupils: Pupils are equal, round, and reactive to light.  Neck:     Musculoskeletal: Neck supple.  Cardiovascular:     Rate and Rhythm: Normal rate and regular rhythm.     Heart sounds: Normal heart sounds. No murmur. No friction rub. No gallop.   Pulmonary:     Effort: Pulmonary effort is normal.     Breath sounds: Normal breath sounds. No wheezing, rhonchi or rales.  Musculoskeletal:     Right lower leg: No edema.     Left lower leg: No edema.     Comments: BLE wrapped in unna boots  Skin:    General: Skin is warm and dry.  Neurological:     Mental Status: She is alert and oriented to person, place, and time.     No  results found for this or any previous visit (from the past 24 hour(s)).  No results found.   ASSESSMENT and PLAN  1. Essential hypertension, benign Controlled. Continue current regime.  - Comprehensive metabolic panel - Lipid panel  2. Nonischemic cardiomyopathy (HCC) euvolemic off lasix. Last crt wnl. Cont low salt diet. Discussed daily weights and RTC precautions. Fu with cards as scheduled.  3. H/O skin graft 4. Multiple open wounds of lower leg, unspecified laterality, subsequent encounter Managed by Dr Sharol Given, vicodin prn wound change, pmp reviewed  5. Need for vaccination Flu vaccine today  Other orders - HYDROcodone-acetaminophen (NORCO/VICODIN) 5-325 MG tablet; Take 1 tablet by mouth every 6 (six) hours as needed for moderate pain. - Flu Vaccine QUAD 36+ mos IM  No follow-ups on file.    Rutherford Guys, MD [facility] [place], [place] [address] Ph.  [phone] Fax [phone]

## 2019-09-28 NOTE — Patient Instructions (Addendum)
Medication Instructions:  Your physician recommends that you continue on your current medications as directed. Please refer to the Current Medication list given to you today.  *If you need a refill on your cardiac medications before your next appointment, please call your pharmacy*  Labwork: None ordered  Testing/Procedures: None ordered  Follow-Up: Your physician recommends that you schedule an overdue follow-up appointment, next available, with Dr. Harrington Challenger.   Remote monitoring is used to monitor your Pacemaker or ICD from home. This monitoring reduces the number of office visits required to check your device to one time per year. It allows [place] to keep an eye on the functioning of your device to ensure it is working properly. You are scheduled for a device check from home on 12/08/2019. You may send your transmission at any time that day. If you have a wireless device, the transmission will be sent automatically. After your physician reviews your transmission, you will receive a postcard with your next transmission date.  Your physician wants you to follow-up in: 1 year with Dr. Curt Bears.  You will receive a reminder letter in the mail two months in advance. If you don't receive a letter, please call our office to schedule the follow-up appointment.  Thank you for choosing [facility]!!   Trinidad Curet, RN [phone]  Any Other Special Instructions Will Be Listed Below (If Applicable).

## 2019-09-28 NOTE — Patient Instructions (Signed)
° ° ° °  If you have lab work done today you will be contacted with your lab results within the next 2 weeks.  If you have not heard from us then please contact us. The fastest way to get your results is to register for My Chart. ° ° °IF you received an x-ray today, you will receive an invoice from [facility]. Please contact [facility] at [phone] with questions or concerns regarding your invoice.  ° °IF you received labwork today, you will receive an invoice from [facility]. Please contact [facility] at [phone] with questions or concerns regarding your invoice.  ° °Our billing staff will not be able to assist you with questions regarding bills from these companies. ° °You will be contacted with the lab results as soon as they are available. The fastest way to get your results is to activate your My Chart account. Instructions are located on the last page of this paperwork. If you have not heard from us regarding the results in 2 weeks, please contact this office. °  ° ° ° °

## 2019-09-28 NOTE — Progress Notes (Signed)
Electrophysiology Office Note   Date:  09/28/2019   ID:  Jarelis, Ehlert March 11, 1961, MRN [id number]  PCP:  Kari Guys, Kari [name]  Cardiologist:  Kari [name] Primary Electrophysiologist: Kari [place] Meredith Leeds, Kari [name]    No chief complaint on file.    History of Present Illness: Kari [name] is a 58 y.o. female who is being seen today for the evaluation of cardiac arrest, CHF at the request of Kari Guys, Kari [name]. Presenting today for electrophysiology evaluation. She has a history of sarcoidosis, HTN, HLD, nonischemic cardiomyopathy, ovarian mass who had VF arrest on 06/26/17 and had a [employer] ICD implanted 07/05/17.   Today, denies symptoms of palpitations, chest pain, shortness of breath, orthopnea, PND, lower extremity edema, claudication, dizziness, presyncope, syncope, bleeding, or neurologic sequela. The patient is tolerating medications without difficulties.  Overall she is doing well.  She has had recent hospitalizations for infections in her legs.  Doing much better.  She is walking with a cane.   Past Medical History:  Diagnosis Date  . Arthritis   . Asthma   . CHF (congestive heart failure) ([place]) 03/12/2014   a. EF 40-45% by echo in 06/2017 with normal cors by cath. ICD placed following VT arrest  . Depression   . Headache(784.0)   . History of blood transfusion   . Hyperlipidemia   . Hypertension   . Lung nodules   . Myocardial infarction ([place]) 06/26/2014  . Renal disorder    followed by [facility]  . Sarcoidosis   . Ulcer    recurring, from chronic venous insufficiency   Past Surgical History:  Procedure Laterality Date  . cataract surgery Left 07-2013  . I&D EXTREMITY Bilateral 12/31/2018   Procedure: DEBRIDEMENT BILATERAL LEGS, APPLY VAC X 2;  Surgeon: Kari Minion, Kari [name];  Location: [facility];  Service: Orthopedics;  Laterality: Bilateral;  . I&D EXTREMITY Bilateral 01/02/2019   Procedure: REPEAT DEBRIDEMENT BILATERAL LEGS, APPLY VAC X 2;  Surgeon: Kari Minion, Kari [name];  Location: [facility];  Service: Orthopedics;  Laterality: Bilateral;  . I&D EXTREMITY Bilateral 07/15/2019   Procedure: IRRIGATION AND DEBRIDEMENT VENOUS STASIS INSUFFICIENCY ULCERATIONS BILATERAL LOWER EXTREMITIES;  Surgeon: Kari Minion, Kari [name];  Location: [facility];  Service: Orthopedics;  Laterality: Bilateral;  . I&D EXTREMITY Bilateral 08/23/2019   Procedure: DEBRIDEMENT EXTREMITY;  Surgeon: Kari Minion, Kari [name];  Location: [facility];  Service: Orthopedics;  Laterality: Bilateral;  . ICD IMPLANT N/A 07/05/2017   Procedure: ICD Implant;  Surgeon: Kari Haw, Kari [name];  Location: [facility];  Service: Cardiovascular;  Laterality: N/A;  . LEFT HEART CATH AND CORONARY ANGIOGRAPHY N/A 07/03/2017   Procedure: Left Heart Cath and Coronary Angiography;  Surgeon: Kari Crome, Kari [name];  Location: [facility];  Service: Cardiovascular;  Laterality: N/A;  . SKIN SPLIT GRAFT Bilateral 01/07/2019   Procedure: SPLIT THICKNESS SKIN GRAFT BILATERAL LEGS, APPLY VAC;  Surgeon: Kari Minion, Kari [name];  Location: [facility];  Service: Orthopedics;  Laterality: Bilateral;  . SKIN SPLIT GRAFT Bilateral 07/17/2019   Procedure: REPEAT IRRIGATION AND DEBRIDEMENT BILATERAL LOWER EXTREMITIES, SKIN GRAFT;  Surgeon: Kari Minion, Kari [name];  Location: [facility];  Service: Orthopedics;  Laterality: Bilateral;  . SKIN SPLIT GRAFT Bilateral 08/26/2019   Procedure: SKIN GRAFT SPLIT THICKNESS BILATERAL LEGS, APPLY WOUND VAC;  Surgeon: Kari Minion, Kari [name];  Location: [facility];  Service: Orthopedics;  Laterality: Bilateral;     Current Outpatient Medications  Medication Sig Dispense Refill  .  acetaminophen (TYLENOL) 325 MG tablet Take 1-2 tablets (325-650 mg total) by mouth every 4 (four) hours as needed for mild pain.    Marland Kitchen albuterol (PROVENTIL HFA;VENTOLIN HFA) 108 (90 Base) MCG/ACT inhaler Inhale 2 puffs into the lungs every 6 (six) hours as needed for shortness of breath. 18 g 0  . allopurinol (ZYLOPRIM) 100 MG tablet Take 2 tablets  (200 mg total) by mouth daily. 180 tablet 1  . aspirin EC 81 MG tablet Take 81 mg by mouth daily.    . carvedilol (COREG) 6.25 MG tablet Take 1 tablet (6.25 mg total) by mouth 2 (two) times daily. 60 tablet 0  . oxyCODONE 10 MG TABS Take 0.5-1 tablets (5-10 mg total) by mouth every 6 (six) hours as needed for severe pain. 28 tablet 0  . polyethylene glycol (MIRALAX / GLYCOLAX) 17 g packet Take 17 g by mouth daily as needed. 30 each 0  . potassium chloride (K-DUR) 10 MEQ tablet Take 30 mEq by mouth daily.    Marland Kitchen senna-docusate (SENOKOT-S) 8.6-50 MG tablet Take 2 tablets by mouth 2 (two) times daily. 120 tablet 0   No current facility-administered medications for this visit.     Allergies:   Tramadol   Social History:  The patient  reports that she has never smoked. She has never used smokeless tobacco. She reports that she does not drink alcohol or use drugs.   Family History:  The patient's family history includes Cancer in her mother; Diabetes Mellitus II in her father and mother; Heart disease in her father; Heart failure in her mother; Hypertension in her mother; Stroke in her father.    ROS:  Please see the history of present illness.   Otherwise, review of systems is positive for none.   All other systems are reviewed and negative.   PHYSICAL EXAM: VS:  BP 102/68   Pulse 73   Ht 5\' 7"  (1.702 m)   Wt 282 lb 6.4 oz (128.1 kg)   LMP 11/21/2011   SpO2 98%   BMI 44.23 kg/m  , BMI Body mass index is 44.23 kg/m. GEN: Well nourished, well developed, in no acute distress  HEENT: normal  Neck: no JVD, carotid bruits, or masses Cardiac: RRR; no murmurs, rubs, or gallops,no edema  Respiratory:  clear to auscultation bilaterally, normal work of breathing GI: soft, nontender, nondistended, + BS MS: no deformity or atrophy  Skin: warm and dry, device site well healed Neuro:  Strength and sensation are intact Psych: euthymic mood, full affect  EKG:  EKG is not ordered today. Personal  review of the ekg ordered 08/19/19 shows sinus rhythm, rate 51  Personal review of the device interrogation today. Results in [facility]: 01/05/2019: Magnesium 2.4 08/28/2019: ALT 11 08/31/2019: BUN 9; Creatinine, Ser 0.90; Hemoglobin 8.2; Platelets 237; Potassium 3.7; Sodium 139    Lipid Panel     Component Value Date/Time   CHOL 169 09/11/2018 1727   TRIG 167 (H) 09/11/2018 1727   HDL 40 09/11/2018 1727   CHOLHDL 4.2 09/11/2018 1727   CHOLHDL 2.5 06/21/2012 0950   VLDL 12 06/21/2012 0950   LDLCALC 96 09/11/2018 1727     Wt Readings from Last 3 Encounters:  09/28/19 282 lb 6.4 oz (128.1 kg)  09/25/19 286 lb (129.7 kg)  09/22/19 286 lb (129.7 kg)      Other studies Reviewed: Additional studies/ records that were reviewed today include: TTE 06/27/17  Review of the above records today  demonstrates:  - Left ventricle: Diffuse hypokinesis worse in the inferior basal   wall The cavity size was moderately dilated. Wall thickness was   increased in a pattern of moderate LVH. Systolic function was   mildly to moderately reduced. The estimated ejection fraction was   in the range of 40% to 45%. Left ventricular diastolic function   parameters were normal. - Aortic valve: There was mild regurgitation. - Mitral valve: There was moderate regurgitation. - Left atrium: The atrium was moderately dilated. - Atrial septum: No defect or patent foramen ovale was identified.  LHC 07/03/17  Left dominant coronary anatomy.  Normal coronary arteries.  Mild to moderate global left ventricular hypo-kinesis with estimated EF 35-40%. LV end-diastolic pressure is up and normal. Combined systolic and diastolic heart failure, chronic   ASSESSMENT AND PLAN:  1.  VF arrest: Status post [place] ICD implanted 07/05/2017.  Device functioning appropriately.    2. Hypertension: Currently well controlled  3. Nonischemic cardiomyopathy: Currently on carvedilol.  Her blood pressure is mildly  reduced today and thus I Kari [name] hold off on adding ACEI/ARB.  We Kamdyn Covel have her follow-up with Dr. Harrington [name] to reestablish with general cardiology.    Current medicines are reviewed at length with the patient today.   The patient does not have concerns regarding her medicines.  The following changes were made today: None  Labs/ tests ordered today include:   No orders of the defined types were placed in this encounter.    Disposition:   FU with Kari [name] 12 months  Signed, Kari Hollan Meredith Leeds, Kari [name]  09/28/2019 8:55 AM     [facility] [address] [place] [address] [phone] (office) [phone] (fax)

## 2019-09-29 ENCOUNTER — Encounter: Payer: Self-pay | Admitting: Family

## 2019-09-29 ENCOUNTER — Ambulatory Visit (INDEPENDENT_AMBULATORY_CARE_PROVIDER_SITE_OTHER): Payer: Medicare Other | Admitting: Family

## 2019-09-29 VITALS — Ht 67.0 in | Wt 281.0 lb

## 2019-09-29 DIAGNOSIS — Z945 Skin transplant status: Secondary | ICD-10-CM

## 2019-09-29 LAB — COMPREHENSIVE METABOLIC PANEL
ALT: 6 IU/L (ref 0–32)
AST: 13 IU/L (ref 0–40)
Albumin/Globulin Ratio: 1 — ABNORMAL LOW (ref 1.2–2.2)
Albumin: 3.7 g/dL — ABNORMAL LOW (ref 3.8–4.9)
Alkaline Phosphatase: 82 IU/L (ref 39–117)
BUN/Creatinine Ratio: 10 (ref 9–23)
BUN: 11 mg/dL (ref 6–24)
Bilirubin Total: 0.3 mg/dL (ref 0.0–1.2)
CO2: 24 mmol/L (ref 20–29)
Calcium: 9.4 mg/dL (ref 8.7–10.2)
Chloride: 104 mmol/L (ref 96–106)
Creatinine, Ser: 1.13 mg/dL — ABNORMAL HIGH (ref 0.57–1.00)
GFR calc Af Amer: 62 mL/min/{1.73_m2} (ref >59)
GFR calc non Af Amer: 54 mL/min/{1.73_m2} — ABNORMAL LOW (ref >59)
Globulin, Total: 3.7 g/dL (ref 1.5–4.5)
Glucose: 105 mg/dL — ABNORMAL HIGH (ref 65–99)
Potassium: 4 mmol/L (ref 3.5–5.2)
Sodium: 140 mmol/L (ref 134–144)
Total Protein: 7.4 g/dL (ref 6.0–8.5)

## 2019-09-29 LAB — LIPID PANEL
Chol/HDL Ratio: 3.8 ratio (ref 0.0–4.4)
Cholesterol, Total: 180 mg/dL (ref 100–199)
HDL: 47 mg/dL (ref >39)
LDL Chol Calc (NIH): 103 mg/dL — ABNORMAL HIGH (ref 0–99)
Triglycerides: 171 mg/dL — ABNORMAL HIGH (ref 0–149)
VLDL Cholesterol Cal: 30 mg/dL (ref 5–40)

## 2019-09-29 NOTE — Progress Notes (Signed)
Post-Op Visit Note   Patient: Kari [name]           Date of Birth: [birth year]           MRN: [id number] Visit Date: 09/29/2019 PCP: Rutherford Guys, MD  Chief Complaint:  Chief Complaint  Patient presents with  . Left Leg - Routine Post Op  . Right Leg - Routine Post Op    HPI:  HPI The patient is a 58 year old woman seen today for evaluation of bilateral lower extremities status post split thickness skin grafting bilaterally.  Good wrinkling of skin with compression. Has been walking more and better.    Ortho Exam On examination bilateral lower extremity she has great wrinkling of skin from compressive dressings. Wounds much improved. Filled in with 90% granulation. Staples in place. There is scant bleeding from bilateral wounds. There is no odor no purulence no erythema. No surrounding maceration.  Visit Diagnoses:  No diagnosis found.  Plan: Silvercell dressings applied to the grafts with an Unna boots applied to the lower extremities. Discussed elevation. She will follow-up in office on Friday for dressing change.   Follow-Up Instructions: Return in about 3 days (around 10/02/2019).   Imaging: No results found.  Orders:  No orders of the defined types were placed in this encounter.  No orders of the defined types were placed in this encounter.    PMFS History: Patient Active Problem List   Diagnosis Date Noted  . Multiple open wounds of lower leg   . Moderate protein-calorie malnutrition ([place])   . Non-healing ulcer ([place]) 08/19/2019  . Postoperative pain   . Labile blood pressure   . Nonischemic cardiomyopathy ([place])   . Physical debility 07/20/2019  . Idiopathic chronic venous hypertension of lower extremity with ulcer, bilateral ([place]) 07/15/2019  . H/O skin graft 05/13/2019  . CKD (chronic kidney disease), stage III 01/13/2019  . Infected wound 12/26/2018  . Idiopathic chronic venous hypertension of both lower extremities with ulcer and inflammation  ([place])   . History of fall 05/10/2018  . Hyperkalemia 02/21/2018  . Hypotension 02/21/2018  . Sepsis ([place]) 02/21/2018  . Encounter for central line placement   . Mitral regurgitation 10/30/2017  . Acute kidney injury ([place]) 07/21/2017  . Generalized weakness 07/20/2017  . Shock circulatory ([place])   . Acute respiratory failure with hypoxia ([place])   . Pulmonary edema   . Cardiac arrest ([place]) 06/26/2017  . Ventricular fibrillation ([place])   . Acute on chronic diastolic heart failure ([place])   . Abnormal ECG   . Cellulitis 04/02/2017  . AKI (acute kidney injury) ([place]) 12/17/2016  . Cellulitis and abscess of right lower extremity 12/17/2016  . Syncope and collapse 12/17/2016  . Peripheral edema 12/17/2016  . Hypokalemia 12/17/2016  . Anemia of chronic disease 12/17/2016  . Acute diastolic CHF (congestive heart failure) ([place]) 03/14/2014  . CHF (congestive heart failure) ([place]) 03/12/2014  . Sarcoidosis ([place]) 03/12/2014  . Severe sepsis ([place]) 10/17/2013  . ARF (acute renal failure) ([place]) 10/08/2013  . Cellulitis of multiple sites of lower extremity 10/08/2013  . Morbid obesity ([place]) 10/08/2013  . Volume depletion 10/08/2013  . Venous (peripheral) insufficiency 10/28/2012  . Mediastinal lymphadenopathy 10/16/2012  . Pulmonary nodules 10/16/2012  . 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 Chest pain 09/02/2012  . Asthma   . Hypertension   . Depression   . Venous stasis ulcers ([place]) 07/29/2012  . Varicose veins of lower extremities with ulcer (  [place]) 07/08/2012  . Venous ulcer of leg ([place]) 07/08/2012   Past Medical History:  Diagnosis Date  . Arthritis   . Asthma   . CHF (congestive heart failure) ([place]) 03/12/2014   a. EF 40-45% by echo in 06/2017 with normal cors by cath. ICD placed following VT arrest  . Depression   . Headache(784.0)   . History of blood transfusion   . Hyperlipidemia   . Hypertension   . Lung nodules   .  Myocardial infarction ([place]) 06/26/2014  . Renal disorder    followed by [facility]  . Sarcoidosis   . Ulcer    recurring, from chronic venous insufficiency    Family History  Problem Relation Age of Onset  . Heart failure Mother   . Diabetes Mellitus II Mother   . Hypertension Mother   . Cancer Mother        unknown type  . Heart disease Father   . Stroke Father   . Diabetes Mellitus II Father     Past Surgical History:  Procedure Laterality Date  . cataract surgery Left 07-2013  . I&D EXTREMITY Bilateral 12/31/2018   Procedure: DEBRIDEMENT BILATERAL LEGS, APPLY VAC X 2;  Surgeon: [name], MD;  Location: [facility];  Service: Orthopedics;  Laterality: Bilateral;  . I&D EXTREMITY Bilateral 01/02/2019   Procedure: REPEAT DEBRIDEMENT BILATERAL LEGS, APPLY VAC X 2;  Surgeon: [name], MD;  Location: [facility];  Service: Orthopedics;  Laterality: Bilateral;  . I&D EXTREMITY Bilateral 07/15/2019   Procedure: IRRIGATION AND DEBRIDEMENT VENOUS STASIS INSUFFICIENCY ULCERATIONS BILATERAL LOWER EXTREMITIES;  Surgeon: [name], MD;  Location: [facility];  Service: Orthopedics;  Laterality: Bilateral;  . I&D EXTREMITY Bilateral 08/23/2019   Procedure: DEBRIDEMENT EXTREMITY;  Surgeon: [name], MD;  Location: [facility];  Service: Orthopedics;  Laterality: Bilateral;  . ICD IMPLANT N/A 07/05/2017   Procedure: ICD Implant;  Surgeon: Constance Haw, MD;  Location: [facility];  Service: Cardiovascular;  Laterality: N/A;  . LEFT HEART CATH AND CORONARY ANGIOGRAPHY N/A 07/03/2017   Procedure: Left Heart Cath and Coronary Angiography;  Surgeon: Belva Crome, MD;  Location: [facility];  Service: Cardiovascular;  Laterality: N/A;  . SKIN SPLIT GRAFT Bilateral 01/07/2019   Procedure: SPLIT THICKNESS SKIN GRAFT BILATERAL LEGS, APPLY VAC;  Surgeon: [name], MD;  Location: [facility];  Service: Orthopedics;  Laterality: Bilateral;  . SKIN SPLIT GRAFT Bilateral 07/17/2019    Procedure: REPEAT IRRIGATION AND DEBRIDEMENT BILATERAL LOWER EXTREMITIES, SKIN GRAFT;  Surgeon: [name], MD;  Location: [facility];  Service: Orthopedics;  Laterality: Bilateral;  . SKIN SPLIT GRAFT Bilateral 08/26/2019   Procedure: SKIN GRAFT SPLIT THICKNESS BILATERAL LEGS, APPLY WOUND VAC;  Surgeon: [name], MD;  Location: [facility];  Service: Orthopedics;  Laterality: Bilateral;   Social History   Occupational History  . Not on file  Tobacco Use  . Smoking status: Never Smoker  . Smokeless tobacco: Never Used  Substance and Sexual Activity  . Alcohol use: No  . Drug use: No  . Sexual activity: Not Currently

## 2019-10-02 ENCOUNTER — Ambulatory Visit (INDEPENDENT_AMBULATORY_CARE_PROVIDER_SITE_OTHER): Payer: Medicare Other | Admitting: Family

## 2019-10-02 ENCOUNTER — Encounter: Payer: Self-pay | Admitting: Family

## 2019-10-02 VITALS — Ht 67.0 in | Wt 281.0 lb

## 2019-10-02 DIAGNOSIS — B964 Proteus (mirabilis) (morganii) as the cause of diseases classified elsewhere: Secondary | ICD-10-CM | POA: Diagnosis not present

## 2019-10-02 DIAGNOSIS — I87313 Chronic venous hypertension (idiopathic) with ulcer of bilateral lower extremity: Secondary | ICD-10-CM | POA: Diagnosis not present

## 2019-10-02 DIAGNOSIS — T8131XA Disruption of external operation (surgical) wound, not elsewhere classified, initial encounter: Secondary | ICD-10-CM | POA: Diagnosis not present

## 2019-10-02 DIAGNOSIS — T8149XA Infection following a procedure, other surgical site, initial encounter: Secondary | ICD-10-CM | POA: Diagnosis not present

## 2019-10-02 DIAGNOSIS — B965 Pseudomonas (aeruginosa) (mallei) (pseudomallei) as the cause of diseases classified elsewhere: Secondary | ICD-10-CM | POA: Diagnosis not present

## 2019-10-02 DIAGNOSIS — I96 Gangrene, not elsewhere classified: Secondary | ICD-10-CM | POA: Diagnosis not present

## 2019-10-02 DIAGNOSIS — Z945 Skin transplant status: Secondary | ICD-10-CM

## 2019-10-06 ENCOUNTER — Ambulatory Visit (INDEPENDENT_AMBULATORY_CARE_PROVIDER_SITE_OTHER): Payer: Medicare Other | Admitting: Family

## 2019-10-06 ENCOUNTER — Encounter: Payer: Self-pay | Admitting: Family

## 2019-10-06 VITALS — Ht 67.0 in | Wt 281.0 lb

## 2019-10-06 DIAGNOSIS — I87313 Chronic venous hypertension (idiopathic) with ulcer of bilateral lower extremity: Secondary | ICD-10-CM | POA: Diagnosis not present

## 2019-10-06 DIAGNOSIS — I96 Gangrene, not elsewhere classified: Secondary | ICD-10-CM | POA: Diagnosis not present

## 2019-10-06 DIAGNOSIS — B965 Pseudomonas (aeruginosa) (mallei) (pseudomallei) as the cause of diseases classified elsewhere: Secondary | ICD-10-CM | POA: Diagnosis not present

## 2019-10-06 DIAGNOSIS — Z945 Skin transplant status: Secondary | ICD-10-CM

## 2019-10-06 DIAGNOSIS — B964 Proteus (mirabilis) (morganii) as the cause of diseases classified elsewhere: Secondary | ICD-10-CM | POA: Diagnosis not present

## 2019-10-06 DIAGNOSIS — T8149XA Infection following a procedure, other surgical site, initial encounter: Secondary | ICD-10-CM | POA: Diagnosis not present

## 2019-10-06 DIAGNOSIS — T8131XA Disruption of external operation (surgical) wound, not elsewhere classified, initial encounter: Secondary | ICD-10-CM | POA: Diagnosis not present

## 2019-10-06 NOTE — Progress Notes (Signed)
Post-Op Visit Note   Patient: Kari [name]           Date of Birth: 1961/10/03           MRN: [id number] Visit Date: 10/06/2019 PCP: Rutherford Guys, MD  Chief Complaint:  Chief Complaint  Patient presents with  . Left Leg - Routine Post Op    08/26/2019 SG Bil LE  . Right Leg - Routine Post Op    HPI:  HPI The patient is a 58 year old woman seen today for evaluation of bilateral lower extremities status post split thickness skin grafting bilaterally on 08/26/19.  Good wrinkling of skin with compression.   Pleased with progress.   Ortho Exam On examination bilateral lower extremity she has great wrinkling of skin from compressive dressings. Wounds filled in with 90% granulation. Thin fibrinous tissue, debrided with gauze. Few staples in place. There is scant bleeding from bilateral wounds. There is no odor no purulence no erythema. No surrounding maceration.  Photos taken today.  Visit Diagnoses:  No diagnosis found.  Plan: Silvercell dressings applied to the grafts with an Unna boots applied to the lower extremities. Discussed elevation. She will follow-up in office on Friday for dressing change.   Follow-Up Instructions: Return in about 3 days (around 10/09/2019).   Imaging: No results found.  Orders:  No orders of the defined types were placed in this encounter.  No orders of the defined types were placed in this encounter.    PMFS History: Patient Active Problem List   Diagnosis Date Noted  . Multiple open wounds of lower leg   . Moderate protein-calorie malnutrition ([place])   . Non-healing ulcer ([place]) 08/19/2019  . Postoperative pain   . Labile blood pressure   . Nonischemic cardiomyopathy ([place])   . Physical debility 07/20/2019  . Idiopathic chronic venous hypertension of lower extremity with ulcer, bilateral ([place]) 07/15/2019  . H/O skin graft 05/13/2019  . CKD (chronic kidney disease), stage III 01/13/2019  . Infected wound 12/26/2018  . Idiopathic  chronic venous hypertension of both lower extremities with ulcer and inflammation ([place])   . History of fall 05/10/2018  . Hyperkalemia 02/21/2018  . Hypotension 02/21/2018  . Sepsis ([place]) 02/21/2018  . Encounter for central line placement   . Mitral regurgitation 10/30/2017  . Acute kidney injury ([place]) 07/21/2017  . Generalized weakness 07/20/2017  . Shock circulatory ([place])   . Acute respiratory failure with hypoxia ([place])   . Pulmonary edema   . Cardiac arrest ([place]) 06/26/2017  . Ventricular fibrillation ([place])   . Acute on chronic diastolic heart failure ([place])   . Abnormal ECG   . Cellulitis 04/02/2017  . AKI (acute kidney injury) ([place]) 12/17/2016  . Cellulitis and abscess of right lower extremity 12/17/2016  . Syncope and collapse 12/17/2016  . Peripheral edema 12/17/2016  . Hypokalemia 12/17/2016  . Anemia of chronic disease 12/17/2016  . Acute diastolic CHF (congestive heart failure) ([place]) 03/14/2014  . CHF (congestive heart failure) ([place]) 03/12/2014  . Sarcoidosis ([place]) 03/12/2014  . Severe sepsis ([place]) 10/17/2013  . ARF (acute renal failure) ([place]) 10/08/2013  . Cellulitis of multiple sites of lower extremity 10/08/2013  . Morbid obesity ([place]) 10/08/2013  . Volume depletion 10/08/2013  . Venous (peripheral) insufficiency 10/28/2012  . Mediastinal lymphadenopathy 10/16/2012  . Pulmonary nodules 10/16/2012  . 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 Chest pain 09/02/2012  . Asthma   . Hypertension   . Depression   .  Venous stasis ulcers ([place]) 07/29/2012  . Varicose veins of lower extremities with ulcer ([place]) 07/08/2012  . Venous ulcer of leg ([place]) 07/08/2012   Past Medical History:  Diagnosis Date  . Arthritis   . Asthma   . CHF (congestive heart failure) ([place]) 03/12/2014   a. EF 40-45% by echo in 06/2017 with normal cors by cath. ICD placed following VT arrest  . Depression   . Headache(784.0)   . History of  blood transfusion   . Hyperlipidemia   . Hypertension   . Lung nodules   . Myocardial infarction ([place]) 06/26/2014  . Renal disorder    followed by [facility]  . Sarcoidosis   . Ulcer    recurring, from chronic venous insufficiency    Family History  Problem Relation Age of Onset  . Heart failure Mother   . Diabetes Mellitus II Mother   . Hypertension Mother   . Cancer Mother        unknown type  . Heart disease Father   . Stroke Father   . Diabetes Mellitus II Father     Past Surgical History:  Procedure Laterality Date  . cataract surgery Left 07-2013  . I&D EXTREMITY Bilateral 12/31/2018   Procedure: DEBRIDEMENT BILATERAL LEGS, APPLY VAC X 2;  Surgeon: [name], MD;  Location: [facility];  Service: Orthopedics;  Laterality: Bilateral;  . I&D EXTREMITY Bilateral 01/02/2019   Procedure: REPEAT DEBRIDEMENT BILATERAL LEGS, APPLY VAC X 2;  Surgeon: [name], MD;  Location: [facility];  Service: Orthopedics;  Laterality: Bilateral;  . I&D EXTREMITY Bilateral 07/15/2019   Procedure: IRRIGATION AND DEBRIDEMENT VENOUS STASIS INSUFFICIENCY ULCERATIONS BILATERAL LOWER EXTREMITIES;  Surgeon: [name], MD;  Location: [place];  Service: Orthopedics;  Laterality: Bilateral;  . I&D EXTREMITY Bilateral 08/23/2019   Procedure: DEBRIDEMENT EXTREMITY;  Surgeon: [name], MD;  Location: [facility];  Service: Orthopedics;  Laterality: Bilateral;  . ICD IMPLANT N/A 07/05/2017   Procedure: ICD Implant;  Surgeon: Constance Haw, MD;  Location: [facility];  Service: Cardiovascular;  Laterality: N/A;  . LEFT HEART CATH AND CORONARY ANGIOGRAPHY N/A 07/03/2017   Procedure: Left Heart Cath and Coronary Angiography;  Surgeon: Belva Crome, MD;  Location: [facility];  Service: Cardiovascular;  Laterality: N/A;  . SKIN SPLIT GRAFT Bilateral 01/07/2019   Procedure: SPLIT THICKNESS SKIN GRAFT BILATERAL LEGS, APPLY VAC;  Surgeon: [name], MD;  Location: [facility];  Service:  Orthopedics;  Laterality: Bilateral;  . SKIN SPLIT GRAFT Bilateral 07/17/2019   Procedure: REPEAT IRRIGATION AND DEBRIDEMENT BILATERAL LOWER EXTREMITIES, SKIN GRAFT;  Surgeon: [name], MD;  Location: [facility];  Service: Orthopedics;  Laterality: Bilateral;  . SKIN SPLIT GRAFT Bilateral 08/26/2019   Procedure: SKIN GRAFT SPLIT THICKNESS BILATERAL LEGS, APPLY WOUND VAC;  Surgeon: [name], MD;  Location: [facility];  Service: Orthopedics;  Laterality: Bilateral;   Social History   Occupational History  . Not on file  Tobacco Use  . Smoking status: Never Smoker  . Smokeless tobacco: Never Used  Substance and Sexual Activity  . Alcohol use: No  . Drug use: No  . Sexual activity: Not Currently

## 2019-10-06 NOTE — Progress Notes (Signed)
Post-Op Visit Note   Patient: Kari [name]           Date of Birth: 03/23/1961           MRN: [id number] Visit Date: 10/02/2019 PCP: Rutherford Guys, MD  Chief Complaint:  Chief Complaint  Patient presents with  . Right Leg - Routine Post Op  . Left Leg - Routine Post Op    HPI:  HPI The patient is a 58 year old woman who presents today for routine postoperative follow-up she is status post bilateral lower extremity split thickness skin grafts.  She has had increased drainage since her staples removed last visit  Ortho Exam On examination bilateral lower extremities ulcers looking great skin graft uptake has been good there is nearly full granulation no active drainage no surrounding maceration no odor no surrounding erythema no sign of infection good wrinkling of skin from compression.  Visit Diagnoses: No diagnosis found.  Plan: We will continue with silver cell dressings and Unna boot and Dynaflex wraps follow-up in the office next week  Follow-Up Instructions: No follow-ups on file.   Imaging: No results found.  Orders:  No orders of the defined types were placed in this encounter.  No orders of the defined types were placed in this encounter.    PMFS History: Patient Active Problem List   Diagnosis Date Noted  . Multiple open wounds of lower leg   . Moderate protein-calorie malnutrition ([place])   . Non-healing ulcer ([place]) 08/19/2019  . Postoperative pain   . Labile blood pressure   . Nonischemic cardiomyopathy ([place])   . Physical debility 07/20/2019  . Idiopathic chronic venous hypertension of lower extremity with ulcer, bilateral ([place]) 07/15/2019  . H/O skin graft 05/13/2019  . CKD (chronic kidney disease), stage III 01/13/2019  . Infected wound 12/26/2018  . Idiopathic chronic venous hypertension of both lower extremities with ulcer and inflammation ([place])   . History of fall 05/10/2018  . Hyperkalemia 02/21/2018  . Hypotension 02/21/2018  . Sepsis  ([place]) 02/21/2018  . Encounter for central line placement   . Mitral regurgitation 10/30/2017  . Acute kidney injury ([place]) 07/21/2017  . Generalized weakness 07/20/2017  . Shock circulatory ([place])   . Acute respiratory failure with hypoxia ([place])   . Pulmonary edema   . Cardiac arrest ([place]) 06/26/2017  . Ventricular fibrillation ([place])   . Acute on chronic diastolic heart failure ([place])   . Abnormal ECG   . Cellulitis 04/02/2017  . AKI (acute kidney injury) ([place]) 12/17/2016  . Cellulitis and abscess of right lower extremity 12/17/2016  . Syncope and collapse 12/17/2016  . Peripheral edema 12/17/2016  . Hypokalemia 12/17/2016  . Anemia of chronic disease 12/17/2016  . Acute diastolic CHF (congestive heart failure) ([place]) 03/14/2014  . CHF (congestive heart failure) ([place]) 03/12/2014  . Sarcoidosis ([place]) 03/12/2014  . Severe sepsis ([place]) 10/17/2013  . ARF (acute renal failure) ([place]) 10/08/2013  . Cellulitis of multiple sites of lower extremity 10/08/2013  . Morbid obesity ([place]) 10/08/2013  . Volume depletion 10/08/2013  . Venous (peripheral) insufficiency 10/28/2012  . Mediastinal lymphadenopathy 10/16/2012  . Pulmonary nodules 10/16/2012  . 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 Chest pain 09/02/2012  . Asthma   . Hypertension   . Depression   . Venous stasis ulcers ([place]) 07/29/2012  . Varicose veins of lower extremities with ulcer ([place]) 07/08/2012  . Venous ulcer of leg ([place]) 07/08/2012   Past Medical History:  Diagnosis Date  . Arthritis   . Asthma   . CHF (congestive heart failure) ([place]) 03/12/2014   a. EF 40-45% by echo in 06/2017 with normal cors by cath. ICD placed following VT arrest  . Depression   . Headache(784.0)   . History of blood transfusion   . Hyperlipidemia   . Hypertension   . Lung nodules   . Myocardial infarction ([place]) 06/26/2014  . Renal disorder    followed by [facility]  . Sarcoidosis   .  Ulcer    recurring, from chronic venous insufficiency    Family History  Problem Relation Age of Onset  . Heart failure Mother   . Diabetes Mellitus II Mother   . Hypertension Mother   . Cancer Mother        unknown type  . Heart disease Father   . Stroke Father   . Diabetes Mellitus II Father     Past Surgical History:  Procedure Laterality Date  . cataract surgery Left 07-2013  . I&D EXTREMITY Bilateral 12/31/2018   Procedure: DEBRIDEMENT BILATERAL LEGS, APPLY VAC X 2;  Surgeon: [name], MD;  Location: [facility];  Service: Orthopedics;  Laterality: Bilateral;  . I&D EXTREMITY Bilateral 01/02/2019   Procedure: REPEAT DEBRIDEMENT BILATERAL LEGS, APPLY VAC X 2;  Surgeon: [name], MD;  Location: [facility];  Service: Orthopedics;  Laterality: Bilateral;  . I&D EXTREMITY Bilateral 07/15/2019   Procedure: IRRIGATION AND DEBRIDEMENT VENOUS STASIS INSUFFICIENCY ULCERATIONS BILATERAL LOWER EXTREMITIES;  Surgeon: [name], MD;  Location: [facility];  Service: Orthopedics;  Laterality: Bilateral;  . I&D EXTREMITY Bilateral 08/23/2019   Procedure: DEBRIDEMENT EXTREMITY;  Surgeon: [name], MD;  Location: [facility];  Service: Orthopedics;  Laterality: Bilateral;  . ICD IMPLANT N/A 07/05/2017   Procedure: ICD Implant;  Surgeon: Constance Haw, MD;  Location: [facility];  Service: Cardiovascular;  Laterality: N/A;  . LEFT HEART CATH AND CORONARY ANGIOGRAPHY N/A 07/03/2017   Procedure: Left Heart Cath and Coronary Angiography;  Surgeon: Belva Crome, MD;  Location: [facility];  Service: Cardiovascular;  Laterality: N/A;  . SKIN SPLIT GRAFT Bilateral 01/07/2019   Procedure: SPLIT THICKNESS SKIN GRAFT BILATERAL LEGS, APPLY VAC;  Surgeon: [name], MD;  Location: [facility];  Service: Orthopedics;  Laterality: Bilateral;  . SKIN SPLIT GRAFT Bilateral 07/17/2019   Procedure: REPEAT IRRIGATION AND DEBRIDEMENT BILATERAL LOWER EXTREMITIES, SKIN GRAFT;  Surgeon: [name], MD;   Location: [facility];  Service: Orthopedics;  Laterality: Bilateral;  . SKIN SPLIT GRAFT Bilateral 08/26/2019   Procedure: SKIN GRAFT SPLIT THICKNESS BILATERAL LEGS, APPLY WOUND VAC;  Surgeon: [name], MD;  Location: [facility];  Service: Orthopedics;  Laterality: Bilateral;   Social History   Occupational History  . Not on file  Tobacco Use  . Smoking status: Never Smoker  . Smokeless tobacco: Never Used  Substance and Sexual Activity  . Alcohol use: No  . Drug use: No  . Sexual activity: Not Currently

## 2019-10-08 DIAGNOSIS — T8131XA Disruption of external operation (surgical) wound, not elsewhere classified, initial encounter: Secondary | ICD-10-CM | POA: Diagnosis not present

## 2019-10-08 DIAGNOSIS — I87313 Chronic venous hypertension (idiopathic) with ulcer of bilateral lower extremity: Secondary | ICD-10-CM | POA: Diagnosis not present

## 2019-10-08 DIAGNOSIS — B964 Proteus (mirabilis) (morganii) as the cause of diseases classified elsewhere: Secondary | ICD-10-CM | POA: Diagnosis not present

## 2019-10-08 DIAGNOSIS — T8149XA Infection following a procedure, other surgical site, initial encounter: Secondary | ICD-10-CM | POA: Diagnosis not present

## 2019-10-08 DIAGNOSIS — I96 Gangrene, not elsewhere classified: Secondary | ICD-10-CM | POA: Diagnosis not present

## 2019-10-08 DIAGNOSIS — B965 Pseudomonas (aeruginosa) (mallei) (pseudomallei) as the cause of diseases classified elsewhere: Secondary | ICD-10-CM | POA: Diagnosis not present

## 2019-10-09 ENCOUNTER — Other Ambulatory Visit: Payer: Self-pay

## 2019-10-09 ENCOUNTER — Encounter: Payer: Self-pay | Admitting: Family

## 2019-10-09 ENCOUNTER — Ambulatory Visit (INDEPENDENT_AMBULATORY_CARE_PROVIDER_SITE_OTHER): Payer: Medicare Other | Admitting: Family

## 2019-10-09 VITALS — Ht 67.0 in | Wt 281.0 lb

## 2019-10-09 DIAGNOSIS — Z6841 Body Mass Index (BMI) 40.0 and over, adult: Secondary | ICD-10-CM | POA: Diagnosis not present

## 2019-10-09 DIAGNOSIS — Z945 Skin transplant status: Secondary | ICD-10-CM | POA: Diagnosis not present

## 2019-10-09 NOTE — Progress Notes (Signed)
Post-Op Visit Note   Patient: Kari [name]           Date of Birth: [birth year]           MRN: [id number] Visit Date: 10/09/2019 PCP: Rutherford Guys, MD  Chief Complaint:  Chief Complaint  Patient presents with  . Left Leg - Routine Post Op    08/26/2019 SG BIL LE  . Right Leg - Routine Post Op    HPI:  HPI The patient is a 58 year old woman seen today for evaluation of bilateral lower extremities status post split thickness skin grafting bilaterally on 08/26/19.  Good wrinkling of skin with compression.   Pleased with progress.   Ortho Exam On examination bilateral lower extremity she has great wrinkling of skin from compressive dressings. Wounds filled in with 90% granulation. Beginning to have surrounding maceration today. more fibrinous tissue, debrided with gauze. Few staples in place. There is scant bleeding from bilateral wounds. There is no odor no purulence no erythema. No surrounding maceration.  Photos taken today.  Visit Diagnoses:  1. S/P split thickness skin graft   2. BMI 45.0-49.9, adult ([place])     Plan: Silvercell dressings applied to the grafts with an Unna boots applied to the lower extremities. Discussed elevation. She will follow-up in office on Friday for dressing change.   Follow-Up Instructions: Return in about 4 years (around 10/09/2023).   Imaging: No results found.  Orders:  No orders of the defined types were placed in this encounter.  No orders of the defined types were placed in this encounter.    PMFS History: Patient Active Problem List   Diagnosis Date Noted  . Multiple open wounds of lower leg   . Moderate protein-calorie malnutrition ([place])   . Non-healing ulcer ([place]) 08/19/2019  . Postoperative pain   . Labile blood pressure   . Nonischemic cardiomyopathy ([place])   . Physical debility 07/20/2019  . Idiopathic chronic venous hypertension of lower extremity with ulcer, bilateral ([place]) 07/15/2019  . H/O skin graft 05/13/2019   . CKD (chronic kidney disease), stage III 01/13/2019  . Infected wound 12/26/2018  . Idiopathic chronic venous hypertension of both lower extremities with ulcer and inflammation ([place])   . History of fall 05/10/2018  . Hyperkalemia 02/21/2018  . Hypotension 02/21/2018  . Sepsis ([place]) 02/21/2018  . Encounter for central line placement   . Mitral regurgitation 10/30/2017  . Acute kidney injury ([place]) 07/21/2017  . Generalized weakness 07/20/2017  . Shock circulatory ([place])   . Acute respiratory failure with hypoxia ([place])   . Pulmonary edema   . Cardiac arrest ([place]) 06/26/2017  . Ventricular fibrillation ([place])   . Acute on chronic diastolic heart failure ([place])   . Abnormal ECG   . Cellulitis 04/02/2017  . AKI (acute kidney injury) ([place]) 12/17/2016  . Cellulitis and abscess of right lower extremity 12/17/2016  . Syncope and collapse 12/17/2016  . Peripheral edema 12/17/2016  . Hypokalemia 12/17/2016  . Anemia of chronic disease 12/17/2016  . Acute diastolic CHF (congestive heart failure) ([place]) 03/14/2014  . CHF (congestive heart failure) ([place]) 03/12/2014  . Sarcoidosis ([place]) 03/12/2014  . Severe sepsis ([place]) 10/17/2013  . ARF (acute renal failure) ([place]) 10/08/2013  . Cellulitis of multiple sites of lower extremity 10/08/2013  . Morbid obesity ([place]) 10/08/2013  . Volume depletion 10/08/2013  . Venous (peripheral) insufficiency 10/28/2012  . Mediastinal lymphadenopathy 10/16/2012  . Pulmonary nodules 10/16/2012  . Atherosclerosis of native arteries of the extremities with  ulceration(440.23) 09/30/2012  . Chest pain 09/02/2012  . Asthma   . Hypertension   . Depression   . Venous stasis ulcers ([place]) 07/29/2012  . Varicose veins of lower extremities with ulcer ([place]) 07/08/2012  . Venous ulcer of leg ([place]) 07/08/2012   Past Medical History:  Diagnosis Date  . Arthritis   . Asthma   . CHF (congestive heart failure) ([place]) 03/12/2014   a. EF 40-45% by echo in 06/2017 with normal  cors by cath. ICD placed following VT arrest  . Depression   . Headache(784.0)   . History of blood transfusion   . Hyperlipidemia   . Hypertension   . Lung nodules   . Myocardial infarction ([place]) 06/26/2014  . Renal disorder    followed by [facility]  . Sarcoidosis   . Ulcer    recurring, from chronic venous insufficiency    Family History  Problem Relation Age of Onset  . Heart failure Mother   . Diabetes Mellitus II Mother   . Hypertension Mother   . Cancer Mother        unknown type  . Heart disease Father   . Stroke Father   . Diabetes Mellitus II Father     Past Surgical History:  Procedure Laterality Date  . cataract surgery Left 07-2013  . I&D EXTREMITY Bilateral 12/31/2018   Procedure: DEBRIDEMENT BILATERAL LEGS, APPLY VAC X 2;  Surgeon: [name], MD;  Location: [facility];  Service: Orthopedics;  Laterality: Bilateral;  . I&D EXTREMITY Bilateral 01/02/2019   Procedure: REPEAT DEBRIDEMENT BILATERAL LEGS, APPLY VAC X 2;  Surgeon: [name], MD;  Location: [facility];  Service: Orthopedics;  Laterality: Bilateral;  . I&D EXTREMITY Bilateral 07/15/2019   Procedure: IRRIGATION AND DEBRIDEMENT VENOUS STASIS INSUFFICIENCY ULCERATIONS BILATERAL LOWER EXTREMITIES;  Surgeon: [name], MD;  Location: [facility];  Service: Orthopedics;  Laterality: Bilateral;  . I&D EXTREMITY Bilateral 08/23/2019   Procedure: DEBRIDEMENT EXTREMITY;  Surgeon: [name], MD;  Location: [facility];  Service: Orthopedics;  Laterality: Bilateral;  . ICD IMPLANT N/A 07/05/2017   Procedure: ICD Implant;  Surgeon: Constance Haw, MD;  Location: [facility];  Service: Cardiovascular;  Laterality: N/A;  . LEFT HEART CATH AND CORONARY ANGIOGRAPHY N/A 07/03/2017   Procedure: Left Heart Cath and Coronary Angiography;  Surgeon: Belva Crome, MD;  Location: [facility];  Service: Cardiovascular;  Laterality: N/A;  . SKIN SPLIT GRAFT Bilateral 01/07/2019   Procedure: SPLIT THICKNESS SKIN  GRAFT BILATERAL LEGS, APPLY VAC;  Surgeon: [name], MD;  Location: [facility];  Service: Orthopedics;  Laterality: Bilateral;  . SKIN SPLIT GRAFT Bilateral 07/17/2019   Procedure: REPEAT IRRIGATION AND DEBRIDEMENT BILATERAL LOWER EXTREMITIES, SKIN GRAFT;  Surgeon: [name], MD;  Location: [facility];  Service: Orthopedics;  Laterality: Bilateral;  . SKIN SPLIT GRAFT Bilateral 08/26/2019   Procedure: SKIN GRAFT SPLIT THICKNESS BILATERAL LEGS, APPLY WOUND VAC;  Surgeon: [name], MD;  Location: [facility];  Service: Orthopedics;  Laterality: Bilateral;   Social History   Occupational History  . Not on file  Tobacco Use  . Smoking status: Never Smoker  . Smokeless tobacco: Never Used  Substance and Sexual Activity  . Alcohol use: No  . Drug use: No  . Sexual activity: Not Currently

## 2019-10-12 DIAGNOSIS — T8131XA Disruption of external operation (surgical) wound, not elsewhere classified, initial encounter: Secondary | ICD-10-CM | POA: Diagnosis not present

## 2019-10-12 DIAGNOSIS — B964 Proteus (mirabilis) (morganii) as the cause of diseases classified elsewhere: Secondary | ICD-10-CM | POA: Diagnosis not present

## 2019-10-12 DIAGNOSIS — B965 Pseudomonas (aeruginosa) (mallei) (pseudomallei) as the cause of diseases classified elsewhere: Secondary | ICD-10-CM | POA: Diagnosis not present

## 2019-10-12 DIAGNOSIS — I96 Gangrene, not elsewhere classified: Secondary | ICD-10-CM | POA: Diagnosis not present

## 2019-10-12 DIAGNOSIS — T8149XA Infection following a procedure, other surgical site, initial encounter: Secondary | ICD-10-CM | POA: Diagnosis not present

## 2019-10-12 DIAGNOSIS — I87313 Chronic venous hypertension (idiopathic) with ulcer of bilateral lower extremity: Secondary | ICD-10-CM | POA: Diagnosis not present

## 2019-10-15 ENCOUNTER — Encounter: Payer: Self-pay | Admitting: Orthopedic Surgery

## 2019-10-15 ENCOUNTER — Ambulatory Visit (INDEPENDENT_AMBULATORY_CARE_PROVIDER_SITE_OTHER): Payer: Medicare Other | Admitting: Orthopedic Surgery

## 2019-10-15 ENCOUNTER — Other Ambulatory Visit: Payer: Self-pay

## 2019-10-15 VITALS — Ht 67.0 in | Wt 281.0 lb

## 2019-10-15 DIAGNOSIS — Z6841 Body Mass Index (BMI) 40.0 and over, adult: Secondary | ICD-10-CM

## 2019-10-15 DIAGNOSIS — I87333 Chronic venous hypertension (idiopathic) with ulcer and inflammation of bilateral lower extremity: Secondary | ICD-10-CM | POA: Diagnosis not present

## 2019-10-15 DIAGNOSIS — L97929 Non-pressure chronic ulcer of unspecified part of left lower leg with unspecified severity: Secondary | ICD-10-CM | POA: Diagnosis not present

## 2019-10-15 DIAGNOSIS — L97919 Non-pressure chronic ulcer of unspecified part of right lower leg with unspecified severity: Secondary | ICD-10-CM | POA: Diagnosis not present

## 2019-10-16 DIAGNOSIS — T8149XA Infection following a procedure, other surgical site, initial encounter: Secondary | ICD-10-CM | POA: Diagnosis not present

## 2019-10-16 DIAGNOSIS — I96 Gangrene, not elsewhere classified: Secondary | ICD-10-CM | POA: Diagnosis not present

## 2019-10-16 DIAGNOSIS — B965 Pseudomonas (aeruginosa) (mallei) (pseudomallei) as the cause of diseases classified elsewhere: Secondary | ICD-10-CM | POA: Diagnosis not present

## 2019-10-16 DIAGNOSIS — T8131XA Disruption of external operation (surgical) wound, not elsewhere classified, initial encounter: Secondary | ICD-10-CM | POA: Diagnosis not present

## 2019-10-16 DIAGNOSIS — I87313 Chronic venous hypertension (idiopathic) with ulcer of bilateral lower extremity: Secondary | ICD-10-CM | POA: Diagnosis not present

## 2019-10-16 DIAGNOSIS — B964 Proteus (mirabilis) (morganii) as the cause of diseases classified elsewhere: Secondary | ICD-10-CM | POA: Diagnosis not present

## 2019-10-19 DIAGNOSIS — I87313 Chronic venous hypertension (idiopathic) with ulcer of bilateral lower extremity: Secondary | ICD-10-CM | POA: Diagnosis not present

## 2019-10-19 DIAGNOSIS — B964 Proteus (mirabilis) (morganii) as the cause of diseases classified elsewhere: Secondary | ICD-10-CM | POA: Diagnosis not present

## 2019-10-19 DIAGNOSIS — T8149XA Infection following a procedure, other surgical site, initial encounter: Secondary | ICD-10-CM | POA: Diagnosis not present

## 2019-10-19 DIAGNOSIS — I96 Gangrene, not elsewhere classified: Secondary | ICD-10-CM | POA: Diagnosis not present

## 2019-10-19 DIAGNOSIS — T8131XA Disruption of external operation (surgical) wound, not elsewhere classified, initial encounter: Secondary | ICD-10-CM | POA: Diagnosis not present

## 2019-10-19 DIAGNOSIS — B965 Pseudomonas (aeruginosa) (mallei) (pseudomallei) as the cause of diseases classified elsewhere: Secondary | ICD-10-CM | POA: Diagnosis not present

## 2019-10-20 ENCOUNTER — Encounter: Payer: Self-pay | Admitting: Orthopedic Surgery

## 2019-10-20 ENCOUNTER — Ambulatory Visit: Payer: Medicare Other | Admitting: Orthopedic Surgery

## 2019-10-20 NOTE — Progress Notes (Signed)
Office Visit Note   Patient: Kari [name]           Date of Birth: 09-Nov-1961           MRN: [id number] Visit Date: 10/15/2019              Requested by: Rutherford Guys, MD [address] [place],  [place] [address] PCP: Rutherford Guys, MD  Chief Complaint  Patient presents with  . Right Leg - Routine Post Op    08/26/2019 SG BIL LE   . Left Leg - Routine Post Op      HPI: Patient is a 58 year old woman who is seen in follow-up status post split-thickness skin graft both lower extremities.  Patient presents at this time due to increased drainage.  Assessment & Plan: Visit Diagnoses:  1. BMI 40.0-44.9, adult ([place])   2. Idiopathic chronic venous hypertension of both lower extremities with ulcer and inflammation (HCC)     Plan: Discussed options with home health nurse dressing changes at home versus continuing dressing changes in the office versus compression stockings.  Patient states she would prefer coming to the office twice a week for dressing changes.  Plan to follow-up on Tuesdays and Fridays.  Follow-Up Instructions: Return in about 1 week (around 10/22/2019) for Follow-up 2 times a week for Dynaflex compression wraps.Manson Passey Exam  Patient is alert, oriented, no adenopathy, well-dressed, normal affect, normal respiratory effort. Examination patient does have increased drainage the graft is healthy and viable no purulence no odor no signs of infection.  Imaging: No results found. No images are attached to the encounter.  Labs: Lab Results  Component Value Date   HGBA1C 5.8 (H) 08/20/2019   HGBA1C 5.6 12/17/2016   HGBA1C 5.5 03/12/2014   REPTSTATUS 08/28/2019 FINAL 08/23/2019   GRAMSTAIN  08/23/2019    MODERATE WBC PRESENT, PREDOMINANTLY PMN FEW GRAM POSITIVE COCCI IN PAIRS IN CLUSTERS RARE GRAM NEGATIVE RODS    CULT  08/23/2019    FEW PSEUDOMONAS AERUGINOSA FEW PROTEUS MIRABILIS FEW STAPHYLOCOCCUS SPECIES (COAGULASE NEGATIVE) NO ANAEROBES ISOLATED  Performed at [facility], [place] [address] [place], [place] [address]    LABORGA PSEUDOMONAS AERUGINOSA 08/23/2019   LABORGA PROTEUS MIRABILIS 08/23/2019     Lab Results  Component Value Date   ALBUMIN 3.7 (L) 09/28/2019   ALBUMIN 1.9 (L) 08/28/2019   ALBUMIN 2.9 (L) 08/19/2019   PREALBUMIN 19.7 08/31/2019    Lab Results  Component Value Date   MG 2.4 01/05/2019   MG 2.3 01/02/2019   MG 2.0 12/27/2018   Lab Results  Component Value Date   VD25OH 26.7 (L) 08/22/2019    Lab Results  Component Value Date   PREALBUMIN 19.7 08/31/2019   CBC EXTENDED Latest Ref Rng & Units 08/31/2019 08/29/2019 08/28/2019  WBC 4.0 - 10.5 K/uL 5.5 7.6 10.8(H)  RBC 3.87 - 5.11 MIL/uL 2.85(L) 2.73(L) 2.58(L)  HGB 12.0 - 15.0 g/dL 8.2(L) 7.7(L) 7.4(L)  HCT 36.0 - 46.0 % 27.3(L) 26.9(L) 24.9(L)  PLT 150 - 400 K/uL 237 244 256  NEUTROABS 1.7 - 7.7 K/uL 3.5 - 7.5  LYMPHSABS 0.7 - 4.0 K/uL 1.2 - 2.1     Body mass index is 44.01 kg/m.  Orders:  No orders of the defined types were placed in this encounter.  No orders of the defined types were placed in this encounter.    Procedures: No procedures performed  Clinical Data: No additional findings.  ROS:  All other  systems negative, except as noted in the HPI. Review of Systems  Objective: Vital Signs: Ht 5\' 7"  (1.702 m)   Wt 281 lb (127.5 kg)   LMP 11/21/2011   BMI 44.01 kg/m   Specialty Comments:  No specialty comments available.  PMFS History: Patient Active Problem List   Diagnosis Date Noted  . Multiple open wounds of lower leg   . Moderate protein-calorie malnutrition ([place])   . Non-healing ulcer ([place]) 08/19/2019  . Postoperative pain   . Labile blood pressure   . Nonischemic cardiomyopathy ([place])   . Physical debility 07/20/2019  . Idiopathic chronic venous hypertension of lower extremity with ulcer, bilateral ([place]) 07/15/2019  . H/O skin graft 05/13/2019  . CKD (chronic kidney disease), stage III 01/13/2019  .  Infected wound 12/26/2018  . Idiopathic chronic venous hypertension of both lower extremities with ulcer and inflammation ([place])   . History of fall 05/10/2018  . Hyperkalemia 02/21/2018  . Hypotension 02/21/2018  . Sepsis ([place]) 02/21/2018  . Encounter for central line placement   . Mitral regurgitation 10/30/2017  . Acute kidney injury ([place]) 07/21/2017  . Generalized weakness 07/20/2017  . Shock circulatory ([place])   . Acute respiratory failure with hypoxia ([place])   . Pulmonary edema   . Cardiac arrest ([place]) 06/26/2017  . Ventricular fibrillation ([place])   . Acute on chronic diastolic heart failure ([place])   . Abnormal ECG   . Cellulitis 04/02/2017  . AKI (acute kidney injury) ([place]) 12/17/2016  . Cellulitis and abscess of right lower extremity 12/17/2016  . Syncope and collapse 12/17/2016  . Peripheral edema 12/17/2016  . Hypokalemia 12/17/2016  . Anemia of chronic disease 12/17/2016  . Acute diastolic CHF (congestive heart failure) ([place]) 03/14/2014  . CHF (congestive heart failure) ([place]) 03/12/2014  . Sarcoidosis ([place]) 03/12/2014  . Severe sepsis ([place]) 10/17/2013  . ARF (acute renal failure) ([place]) 10/08/2013  . Cellulitis of multiple sites of lower extremity 10/08/2013  . Morbid obesity ([place]) 10/08/2013  . Volume depletion 10/08/2013  . Venous (peripheral) insufficiency 10/28/2012  . Mediastinal lymphadenopathy 10/16/2012  . Pulmonary nodules 10/16/2012  . 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 Chest pain 09/02/2012  . Asthma   . Hypertension   . Depression   . Venous stasis ulcers ([place]) 07/29/2012  . Varicose veins of lower extremities with ulcer ([place]) 07/08/2012  . Venous ulcer of leg ([place]) 07/08/2012   Past Medical History:  Diagnosis Date  . Arthritis   . Asthma   . CHF (congestive heart failure) ([place]) 03/12/2014   a. EF 40-45% by echo in 06/2017 with normal cors by cath. ICD placed following VT arrest  . Depression    . Headache(784.0)   . History of blood transfusion   . Hyperlipidemia   . Hypertension   . Lung nodules   . Myocardial infarction ([place]) 06/26/2014  . Renal disorder    followed by [facility]  . Sarcoidosis   . Ulcer    recurring, from chronic venous insufficiency    Family History  Problem Relation Age of Onset  . Heart failure Mother   . Diabetes Mellitus II Mother   . Hypertension Mother   . Cancer Mother        unknown type  . Heart disease Father   . Stroke Father   . Diabetes Mellitus II Father     Past Surgical History:  Procedure Laterality Date  . cataract surgery Left 07-2013  . I&D EXTREMITY  Bilateral 12/31/2018   Procedure: DEBRIDEMENT BILATERAL LEGS, APPLY VAC X 2;  Surgeon: [name], MD;  Location: [facility];  Service: Orthopedics;  Laterality: Bilateral;  . I&D EXTREMITY Bilateral 01/02/2019   Procedure: REPEAT DEBRIDEMENT BILATERAL LEGS, APPLY VAC X 2;  Surgeon: [name], MD;  Location: [facility];  Service: Orthopedics;  Laterality: Bilateral;  . I&D EXTREMITY Bilateral 07/15/2019   Procedure: IRRIGATION AND DEBRIDEMENT VENOUS STASIS INSUFFICIENCY ULCERATIONS BILATERAL LOWER EXTREMITIES;  Surgeon: [name], MD;  Location: [facility];  Service: Orthopedics;  Laterality: Bilateral;  . I&D EXTREMITY Bilateral 08/23/2019   Procedure: DEBRIDEMENT EXTREMITY;  Surgeon: [name], MD;  Location: [facility];  Service: Orthopedics;  Laterality: Bilateral;  . ICD IMPLANT N/A 07/05/2017   Procedure: ICD Implant;  Surgeon: Constance Haw, MD;  Location: [facility];  Service: Cardiovascular;  Laterality: N/A;  . LEFT HEART CATH AND CORONARY ANGIOGRAPHY N/A 07/03/2017   Procedure: Left Heart Cath and Coronary Angiography;  Surgeon: Belva Crome, MD;  Location: [facility];  Service: Cardiovascular;  Laterality: N/A;  . SKIN SPLIT GRAFT Bilateral 01/07/2019   Procedure: SPLIT THICKNESS SKIN GRAFT BILATERAL LEGS, APPLY VAC;  Surgeon: [name], MD;   Location: [facility];  Service: Orthopedics;  Laterality: Bilateral;  . SKIN SPLIT GRAFT Bilateral 07/17/2019   Procedure: REPEAT IRRIGATION AND DEBRIDEMENT BILATERAL LOWER EXTREMITIES, SKIN GRAFT;  Surgeon: [name], MD;  Location: [facility];  Service: Orthopedics;  Laterality: Bilateral;  . SKIN SPLIT GRAFT Bilateral 08/26/2019   Procedure: SKIN GRAFT SPLIT THICKNESS BILATERAL LEGS, APPLY WOUND VAC;  Surgeon: [name], MD;  Location: [facility];  Service: Orthopedics;  Laterality: Bilateral;   Social History   Occupational History  . Not on file  Tobacco Use  . Smoking status: Never Smoker  . Smokeless tobacco: Never Used  Substance and Sexual Activity  . Alcohol use: No  . Drug use: No  . Sexual activity: Not Currently

## 2019-10-22 ENCOUNTER — Other Ambulatory Visit: Payer: Self-pay

## 2019-10-22 ENCOUNTER — Encounter: Payer: Self-pay | Admitting: Orthopedic Surgery

## 2019-10-22 ENCOUNTER — Ambulatory Visit (INDEPENDENT_AMBULATORY_CARE_PROVIDER_SITE_OTHER): Payer: Medicare Other | Admitting: Orthopedic Surgery

## 2019-10-22 VITALS — Ht 67.0 in | Wt 281.0 lb

## 2019-10-22 DIAGNOSIS — I96 Gangrene, not elsewhere classified: Secondary | ICD-10-CM | POA: Diagnosis not present

## 2019-10-22 DIAGNOSIS — I87333 Chronic venous hypertension (idiopathic) with ulcer and inflammation of bilateral lower extremity: Secondary | ICD-10-CM | POA: Diagnosis not present

## 2019-10-22 DIAGNOSIS — L97929 Non-pressure chronic ulcer of unspecified part of left lower leg with unspecified severity: Secondary | ICD-10-CM | POA: Diagnosis not present

## 2019-10-22 DIAGNOSIS — I87313 Chronic venous hypertension (idiopathic) with ulcer of bilateral lower extremity: Secondary | ICD-10-CM | POA: Diagnosis not present

## 2019-10-22 DIAGNOSIS — T8149XA Infection following a procedure, other surgical site, initial encounter: Secondary | ICD-10-CM | POA: Diagnosis not present

## 2019-10-22 DIAGNOSIS — T8131XA Disruption of external operation (surgical) wound, not elsewhere classified, initial encounter: Secondary | ICD-10-CM | POA: Diagnosis not present

## 2019-10-22 DIAGNOSIS — B964 Proteus (mirabilis) (morganii) as the cause of diseases classified elsewhere: Secondary | ICD-10-CM | POA: Diagnosis not present

## 2019-10-22 DIAGNOSIS — L97919 Non-pressure chronic ulcer of unspecified part of right lower leg with unspecified severity: Secondary | ICD-10-CM | POA: Diagnosis not present

## 2019-10-22 DIAGNOSIS — B965 Pseudomonas (aeruginosa) (mallei) (pseudomallei) as the cause of diseases classified elsewhere: Secondary | ICD-10-CM | POA: Diagnosis not present

## 2019-10-22 MED ORDER — HYDROCODONE-ACETAMINOPHEN 5-325 MG PO TABS: 1.0000 | ORAL_TABLET | Freq: Four times a day (QID) | ORAL | 0 refills | Status: DC | PRN

## 2019-10-22 NOTE — Progress Notes (Signed)
Office Visit Note   Patient: Kari [name]           Date of Birth: 11/19/1961           MRN: [id number] Visit Date: 10/22/2019              Requested by: Rutherford Guys, MD [address] [place],  [place] [address] PCP: Rutherford Guys, MD  Chief Complaint  Patient presents with  . Right Leg - Routine Post Op    08/26/19 ST[facility] BLE   . Left Leg - Routine Post Op      HPI: Patient is a 58 year old woman status post split-thickness skin graft for massive venous ulcers both lower extremities.  Assessment & Plan: Visit Diagnoses:  1. Idiopathic chronic venous hypertension of both lower extremities with ulcer and inflammation (HCC)     Plan: Dynaflex wraps were applied to both legs patient is making excellent progress with twice a week dressing changes we will plan to follow-up Monday and Thursday for Dynaflex wraps.  Follow-Up Instructions: Return in about 1 week (around 10/29/2019).   Ortho Exam  Patient is alert, oriented, no adenopathy, well-dressed, normal affect, normal respiratory effort. Examination patient's wounds are healing well there is no cellulitis no drainage no odor there is good granulation tissue there is no signs of infection.  Imaging: No results found. No images are attached to the encounter.  Labs: Lab Results  Component Value Date   HGBA1C 5.8 (H) 08/20/2019   HGBA1C 5.6 12/17/2016   HGBA1C 5.5 03/12/2014   REPTSTATUS 08/28/2019 FINAL 08/23/2019   GRAMSTAIN  08/23/2019    MODERATE WBC PRESENT, PREDOMINANTLY PMN FEW GRAM POSITIVE COCCI IN PAIRS IN CLUSTERS RARE GRAM NEGATIVE RODS    CULT  08/23/2019    FEW PSEUDOMONAS AERUGINOSA FEW PROTEUS MIRABILIS FEW STAPHYLOCOCCUS SPECIES (COAGULASE NEGATIVE) NO ANAEROBES ISOLATED Performed at [facility], [place] [address], [place], [place] [address]    LABORGA PSEUDOMONAS AERUGINOSA 08/23/2019   LABORGA PROTEUS MIRABILIS 08/23/2019     Lab Results  Component Value Date   ALBUMIN 3.7 (L)  09/28/2019   ALBUMIN 1.9 (L) 08/28/2019   ALBUMIN 2.9 (L) 08/19/2019   PREALBUMIN 19.7 08/31/2019    Lab Results  Component Value Date   MG 2.4 01/05/2019   MG 2.3 01/02/2019   MG 2.0 12/27/2018   Lab Results  Component Value Date   VD25OH 26.7 (L) 08/22/2019    Lab Results  Component Value Date   PREALBUMIN 19.7 08/31/2019   CBC EXTENDED Latest Ref Rng & Units 08/31/2019 08/29/2019 08/28/2019  WBC 4.0 - 10.5 K/uL 5.5 7.6 10.8(H)  RBC 3.87 - 5.11 MIL/uL 2.85(L) 2.73(L) 2.58(L)  HGB 12.0 - 15.0 g/dL 8.2(L) 7.7(L) 7.4(L)  HCT 36.0 - 46.0 % 27.3(L) 26.9(L) 24.9(L)  PLT 150 - 400 K/uL 237 244 256  NEUTROABS 1.7 - 7.7 K/uL 3.5 - 7.5  LYMPHSABS 0.7 - 4.0 K/uL 1.2 - 2.1     Body mass index is 44.01 kg/m.  Orders:  No orders of the defined types were placed in this encounter.  No orders of the defined types were placed in this encounter.    Procedures: No procedures performed  Clinical Data: No additional findings.  ROS:  All other systems negative, except as noted in the HPI. Review of Systems  Objective: Vital Signs: Ht 5\' 7"  (1.702 m)   Wt 281 lb (127.5 kg)   LMP 11/21/2011   BMI 44.01 kg/m   Specialty  Comments:  No specialty comments available.  PMFS History: Patient Active Problem List   Diagnosis Date Noted  . Multiple open wounds of lower leg   . Moderate protein-calorie malnutrition ([place])   . Non-healing ulcer ([place]) 08/19/2019  . Postoperative pain   . Labile blood pressure   . Nonischemic cardiomyopathy ([place])   . Physical debility 07/20/2019  . Idiopathic chronic venous hypertension of lower extremity with ulcer, bilateral ([place]) 07/15/2019  . H/O skin graft 05/13/2019  . CKD (chronic kidney disease), stage III 01/13/2019  . Infected wound 12/26/2018  . Idiopathic chronic venous hypertension of both lower extremities with ulcer and inflammation ([place])   . History of fall 05/10/2018  . Hyperkalemia 02/21/2018  . Hypotension 02/21/2018  . Sepsis  ([place]) 02/21/2018  . Encounter for central line placement   . Mitral regurgitation 10/30/2017  . Acute kidney injury ([place]) 07/21/2017  . Generalized weakness 07/20/2017  . Shock circulatory ([place])   . Acute respiratory failure with hypoxia ([place])   . Pulmonary edema   . Cardiac arrest ([place]) 06/26/2017  . Ventricular fibrillation ([place])   . Acute on chronic diastolic heart failure ([place])   . Abnormal ECG   . Cellulitis 04/02/2017  . AKI (acute kidney injury) ([place]) 12/17/2016  . Cellulitis and abscess of right lower extremity 12/17/2016  . Syncope and collapse 12/17/2016  . Peripheral edema 12/17/2016  . Hypokalemia 12/17/2016  . Anemia of chronic disease 12/17/2016  . Acute diastolic CHF (congestive heart failure) ([place]) 03/14/2014  . CHF (congestive heart failure) ([place]) 03/12/2014  . Sarcoidosis ([place]) 03/12/2014  . Severe sepsis ([place]) 10/17/2013  . ARF (acute renal failure) ([place]) 10/08/2013  . Cellulitis of multiple sites of lower extremity 10/08/2013  . Morbid obesity ([name]) 10/08/2013  . Volume depletion 10/08/2013  . Venous (peripheral) insufficiency 10/28/2012  . Mediastinal lymphadenopathy 10/16/2012  . Pulmonary nodules 10/16/2012  . 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 Chest pain 09/02/2012  . Asthma   . Hypertension   . Depression   . Venous stasis ulcers ([place]) 07/29/2012  . Varicose veins of lower extremities with ulcer ([place]) 07/08/2012  . Venous ulcer of leg ([place]) 07/08/2012   Past Medical History:  Diagnosis Date  . Arthritis   . Asthma   . CHF (congestive heart failure) ([place]) 03/12/2014   a. EF 40-45% by echo in 06/2017 with normal cors by cath. ICD placed following VT arrest  . Depression   . Headache(784.0)   . History of blood transfusion   . Hyperlipidemia   . Hypertension   . Lung nodules   . Myocardial infarction ([place]) 06/26/2014  . Renal disorder    followed by [facility]  . Sarcoidosis   .  Ulcer    recurring, from chronic venous insufficiency    Family History  Problem Relation Age of Onset  . Heart failure Mother   . Diabetes Mellitus II Mother   . Hypertension Mother   . Cancer Mother        unknown type  . Heart disease Father   . Stroke Father   . Diabetes Mellitus II Father     Past Surgical History:  Procedure Laterality Date  . cataract surgery Left 07-2013  . I&D EXTREMITY Bilateral 12/31/2018   Procedure: DEBRIDEMENT BILATERAL LEGS, APPLY VAC X 2;  Surgeon: [name], MD;  Location: [facility];  Service: Orthopedics;  Laterality: Bilateral;  . I&D EXTREMITY Bilateral 01/02/2019   Procedure: REPEAT DEBRIDEMENT  BILATERAL LEGS, APPLY VAC X 2;  Surgeon: [name], MD;  Location: [facility];  Service: Orthopedics;  Laterality: Bilateral;  . I&D EXTREMITY Bilateral 07/15/2019   Procedure: IRRIGATION AND DEBRIDEMENT VENOUS STASIS INSUFFICIENCY ULCERATIONS BILATERAL LOWER EXTREMITIES;  Surgeon: [name], MD;  Location: [facility];  Service: Orthopedics;  Laterality: Bilateral;  . I&D EXTREMITY Bilateral 08/23/2019   Procedure: DEBRIDEMENT EXTREMITY;  Surgeon: [name], MD;  Location: [facility];  Service: Orthopedics;  Laterality: Bilateral;  . ICD IMPLANT N/A 07/05/2017   Procedure: ICD Implant;  Surgeon: Constance Haw, MD;  Location: [facility];  Service: Cardiovascular;  Laterality: N/A;  . LEFT HEART CATH AND CORONARY ANGIOGRAPHY N/A 07/03/2017   Procedure: Left Heart Cath and Coronary Angiography;  Surgeon: Belva Crome, MD;  Location: [facility];  Service: Cardiovascular;  Laterality: N/A;  . SKIN SPLIT GRAFT Bilateral 01/07/2019   Procedure: SPLIT THICKNESS SKIN GRAFT BILATERAL LEGS, APPLY VAC;  Surgeon: [name], MD;  Location: [facility];  Service: Orthopedics;  Laterality: Bilateral;  . SKIN SPLIT GRAFT Bilateral 07/17/2019   Procedure: REPEAT IRRIGATION AND DEBRIDEMENT BILATERAL LOWER EXTREMITIES, SKIN GRAFT;  Surgeon: [name], MD;   Location: [facility];  Service: Orthopedics;  Laterality: Bilateral;  . SKIN SPLIT GRAFT Bilateral 08/26/2019   Procedure: SKIN GRAFT SPLIT THICKNESS BILATERAL LEGS, APPLY WOUND VAC;  Surgeon: [name], MD;  Location: [facility];  Service: Orthopedics;  Laterality: Bilateral;   Social History   Occupational History  . Not on file  Tobacco Use  . Smoking status: Never Smoker  . Smokeless tobacco: Never Used  Substance and Sexual Activity  . Alcohol use: No  . Drug use: No  . Sexual activity: Not Currently

## 2019-10-23 ENCOUNTER — Ambulatory Visit: Payer: Medicare Other | Admitting: Family

## 2019-10-23 ENCOUNTER — Encounter: Payer: Self-pay | Admitting: Orthopedic Surgery

## 2019-10-24 DIAGNOSIS — D869 Sarcoidosis, unspecified: Secondary | ICD-10-CM | POA: Diagnosis not present

## 2019-10-24 DIAGNOSIS — J45909 Unspecified asthma, uncomplicated: Secondary | ICD-10-CM | POA: Diagnosis not present

## 2019-10-24 DIAGNOSIS — B965 Pseudomonas (aeruginosa) (mallei) (pseudomallei) as the cause of diseases classified elsewhere: Secondary | ICD-10-CM | POA: Diagnosis not present

## 2019-10-24 DIAGNOSIS — I5033 Acute on chronic diastolic (congestive) heart failure: Secondary | ICD-10-CM | POA: Diagnosis not present

## 2019-10-24 DIAGNOSIS — I130 Hypertensive heart and chronic kidney disease with heart failure and stage 1 through stage 4 chronic kidney disease, or unspecified chronic kidney disease: Secondary | ICD-10-CM | POA: Diagnosis not present

## 2019-10-24 DIAGNOSIS — Z95810 Presence of automatic (implantable) cardiac defibrillator: Secondary | ICD-10-CM | POA: Diagnosis not present

## 2019-10-24 DIAGNOSIS — T8131XA Disruption of external operation (surgical) wound, not elsewhere classified, initial encounter: Secondary | ICD-10-CM | POA: Diagnosis not present

## 2019-10-24 DIAGNOSIS — D631 Anemia in chronic kidney disease: Secondary | ICD-10-CM | POA: Diagnosis not present

## 2019-10-24 DIAGNOSIS — N183 Chronic kidney disease, stage 3 unspecified: Secondary | ICD-10-CM | POA: Diagnosis not present

## 2019-10-24 DIAGNOSIS — I87313 Chronic venous hypertension (idiopathic) with ulcer of bilateral lower extremity: Secondary | ICD-10-CM | POA: Diagnosis not present

## 2019-10-24 DIAGNOSIS — E785 Hyperlipidemia, unspecified: Secondary | ICD-10-CM | POA: Diagnosis not present

## 2019-10-24 DIAGNOSIS — E440 Moderate protein-calorie malnutrition: Secondary | ICD-10-CM | POA: Diagnosis not present

## 2019-10-24 DIAGNOSIS — F329 Major depressive disorder, single episode, unspecified: Secondary | ICD-10-CM | POA: Diagnosis not present

## 2019-10-24 DIAGNOSIS — T8149XA Infection following a procedure, other surgical site, initial encounter: Secondary | ICD-10-CM | POA: Diagnosis not present

## 2019-10-24 DIAGNOSIS — M1990 Unspecified osteoarthritis, unspecified site: Secondary | ICD-10-CM | POA: Diagnosis not present

## 2019-10-24 DIAGNOSIS — Z7982 Long term (current) use of aspirin: Secondary | ICD-10-CM | POA: Diagnosis not present

## 2019-10-24 DIAGNOSIS — L97829 Non-pressure chronic ulcer of other part of left lower leg with unspecified severity: Secondary | ICD-10-CM | POA: Diagnosis not present

## 2019-10-24 DIAGNOSIS — I252 Old myocardial infarction: Secondary | ICD-10-CM | POA: Diagnosis not present

## 2019-10-24 DIAGNOSIS — I428 Other cardiomyopathies: Secondary | ICD-10-CM | POA: Diagnosis not present

## 2019-10-24 DIAGNOSIS — Z6841 Body Mass Index (BMI) 40.0 and over, adult: Secondary | ICD-10-CM | POA: Diagnosis not present

## 2019-10-24 DIAGNOSIS — R911 Solitary pulmonary nodule: Secondary | ICD-10-CM | POA: Diagnosis not present

## 2019-10-24 DIAGNOSIS — I96 Gangrene, not elsewhere classified: Secondary | ICD-10-CM | POA: Diagnosis not present

## 2019-10-24 DIAGNOSIS — L97819 Non-pressure chronic ulcer of other part of right lower leg with unspecified severity: Secondary | ICD-10-CM | POA: Diagnosis not present

## 2019-10-24 DIAGNOSIS — B964 Proteus (mirabilis) (morganii) as the cause of diseases classified elsewhere: Secondary | ICD-10-CM | POA: Diagnosis not present

## 2019-10-26 ENCOUNTER — Ambulatory Visit (INDEPENDENT_AMBULATORY_CARE_PROVIDER_SITE_OTHER): Payer: Medicare Other | Admitting: Orthopedic Surgery

## 2019-10-26 ENCOUNTER — Encounter: Payer: Self-pay | Admitting: Orthopedic Surgery

## 2019-10-26 ENCOUNTER — Other Ambulatory Visit: Payer: Self-pay

## 2019-10-26 VITALS — Ht 67.0 in | Wt 295.6 lb

## 2019-10-26 DIAGNOSIS — I96 Gangrene, not elsewhere classified: Secondary | ICD-10-CM | POA: Diagnosis not present

## 2019-10-26 DIAGNOSIS — I87313 Chronic venous hypertension (idiopathic) with ulcer of bilateral lower extremity: Secondary | ICD-10-CM | POA: Diagnosis not present

## 2019-10-26 DIAGNOSIS — T8149XA Infection following a procedure, other surgical site, initial encounter: Secondary | ICD-10-CM | POA: Diagnosis not present

## 2019-10-26 DIAGNOSIS — I87333 Chronic venous hypertension (idiopathic) with ulcer and inflammation of bilateral lower extremity: Secondary | ICD-10-CM

## 2019-10-26 DIAGNOSIS — L97919 Non-pressure chronic ulcer of unspecified part of right lower leg with unspecified severity: Secondary | ICD-10-CM | POA: Diagnosis not present

## 2019-10-26 DIAGNOSIS — L97929 Non-pressure chronic ulcer of unspecified part of left lower leg with unspecified severity: Secondary | ICD-10-CM

## 2019-10-26 DIAGNOSIS — T8131XA Disruption of external operation (surgical) wound, not elsewhere classified, initial encounter: Secondary | ICD-10-CM | POA: Diagnosis not present

## 2019-10-26 DIAGNOSIS — B965 Pseudomonas (aeruginosa) (mallei) (pseudomallei) as the cause of diseases classified elsewhere: Secondary | ICD-10-CM | POA: Diagnosis not present

## 2019-10-26 DIAGNOSIS — B964 Proteus (mirabilis) (morganii) as the cause of diseases classified elsewhere: Secondary | ICD-10-CM | POA: Diagnosis not present

## 2019-10-27 ENCOUNTER — Encounter: Payer: Self-pay | Admitting: Orthopedic Surgery

## 2019-10-27 NOTE — Progress Notes (Signed)
Office Visit Note   Patient: Kari [name]           Date of Birth: 1961/01/15           MRN: [id number] Visit Date: 10/26/2019              Requested by: Rutherford Guys, MD [address] [place],  [place] [address] PCP: Rutherford Guys, MD  Chief Complaint  Patient presents with  . Left Leg - Routine Post Op    08/26/2019 BIL LE SG  . Right Leg - Routine Post Op      HPI: Patient is a 58 year old woman who was seen in follow-up for venous insufficiency bilateral lower extremities.  Patient states that since we have been wrapping her legs twice a week she feels 100% better.  Assessment & Plan: Visit Diagnoses:  1. Idiopathic chronic venous hypertension of both lower extremities with ulcer and inflammation ([place])     Plan: Follow-up Mondays and Thursdays for Dynaflex wraps to both lower extremities.  Follow-Up Instructions: Return in about 1 week (around 11/02/2019) for Follow-up Mondays and Thursdays.Manson Passey Exam  Patient is alert, oriented, no adenopathy, well-dressed, normal affect, normal respiratory effort. Examination of both lower extremities patient has healthy granulation tissue in the wound beds.  She has brawny skin color changes but no exposed bone or tendon no cellulitis no odor no drainage she has status post skin graft.  Imaging: No results found. No images are attached to the encounter.  Labs: Lab Results  Component Value Date   HGBA1C 5.8 (H) 08/20/2019   HGBA1C 5.6 12/17/2016   HGBA1C 5.5 03/12/2014   REPTSTATUS 08/28/2019 FINAL 08/23/2019   GRAMSTAIN  08/23/2019    MODERATE WBC PRESENT, PREDOMINANTLY PMN FEW GRAM POSITIVE COCCI IN PAIRS IN CLUSTERS RARE GRAM NEGATIVE RODS    CULT  08/23/2019    FEW PSEUDOMONAS AERUGINOSA FEW PROTEUS MIRABILIS FEW STAPHYLOCOCCUS SPECIES (COAGULASE NEGATIVE) NO ANAEROBES ISOLATED Performed at [facility], [place] [address] [place], [place] [address]    LABORGA PSEUDOMONAS AERUGINOSA 08/23/2019   LABORGA PROTEUS MIRABILIS 08/23/2019     Lab Results  Component Value Date   ALBUMIN 3.7 (L) 09/28/2019   ALBUMIN 1.9 (L) 08/28/2019   ALBUMIN 2.9 (L) 08/19/2019   PREALBUMIN 19.7 08/31/2019    Lab Results  Component Value Date   MG 2.4 01/05/2019   MG 2.3 01/02/2019   MG 2.0 12/27/2018   Lab Results  Component Value Date   VD25OH 26.7 (L) 08/22/2019    Lab Results  Component Value Date   PREALBUMIN 19.7 08/31/2019   CBC EXTENDED Latest Ref Rng & Units 08/31/2019 08/29/2019 08/28/2019  WBC 4.0 - 10.5 K/uL 5.5 7.6 10.8(H)  RBC 3.87 - 5.11 MIL/uL 2.85(L) 2.73(L) 2.58(L)  HGB 12.0 - 15.0 g/dL 8.2(L) 7.7(L) 7.4(L)  HCT 36.0 - 46.0 % 27.3(L) 26.9(L) 24.9(L)  PLT 150 - 400 K/uL 237 244 256  NEUTROABS 1.7 - 7.7 K/uL 3.5 - 7.5  LYMPHSABS 0.7 - 4.0 K/uL 1.2 - 2.1     Body mass index is 46.3 kg/m.  Orders:  No orders of the defined types were placed in this encounter.  No orders of the defined types were placed in this encounter.    Procedures: No procedures performed  Clinical Data: No additional findings.  ROS:  All other systems negative, except as noted in the HPI. Review of Systems  Objective: Vital Signs: Ht 5\' 7"  (1.702 m)  Wt 295 lb 9.6 oz (134.1 kg)   LMP 11/21/2011   BMI 46.30 kg/m   Specialty Comments:  No specialty comments available.  PMFS History: Patient Active Problem List   Diagnosis Date Noted  . Multiple open wounds of lower leg   . Moderate protein-calorie malnutrition ([place])   . Non-healing ulcer ([place]) 08/19/2019  . Postoperative pain   . Labile blood pressure   . Nonischemic cardiomyopathy ([place])   . Physical debility 07/20/2019  . Idiopathic chronic venous hypertension of lower extremity with ulcer, bilateral ([place]) 07/15/2019  . H/O skin graft 05/13/2019  . CKD (chronic kidney disease), stage III 01/13/2019  . Infected wound 12/26/2018  . Idiopathic chronic venous hypertension of both lower extremities with ulcer and inflammation  ([place])   . History of fall 05/10/2018  . Hyperkalemia 02/21/2018  . Hypotension 02/21/2018  . Sepsis ([place]) 02/21/2018  . Encounter for central line placement   . Mitral regurgitation 10/30/2017  . Acute kidney injury ([place]) 07/21/2017  . Generalized weakness 07/20/2017  . Shock circulatory ([place])   . Acute respiratory failure with hypoxia ([place])   . Pulmonary edema   . Cardiac arrest ([place]) 06/26/2017  . Ventricular fibrillation ([place])   . Acute on chronic diastolic heart failure ([place])   . Abnormal ECG   . Cellulitis 04/02/2017  . AKI (acute kidney injury) ([place]) 12/17/2016  . Cellulitis and abscess of right lower extremity 12/17/2016  . Syncope and collapse 12/17/2016  . Peripheral edema 12/17/2016  . Hypokalemia 12/17/2016  . Anemia of chronic disease 12/17/2016  . Acute diastolic CHF (congestive heart failure) ([place]) 03/14/2014  . CHF (congestive heart failure) ([place]) 03/12/2014  . Sarcoidosis ([place]) 03/12/2014  . Severe sepsis ([place]) 10/17/2013  . ARF (acute renal failure) ([place]) 10/08/2013  . Cellulitis of multiple sites of lower extremity 10/08/2013  . Morbid obesity ([place]) 10/08/2013  . Volume depletion 10/08/2013  . Venous (peripheral) insufficiency 10/28/2012  . Mediastinal lymphadenopathy 10/16/2012  . Pulmonary nodules 10/16/2012  . 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 Chest pain 09/02/2012  . Asthma   . Hypertension   . Depression   . Venous stasis ulcers ([place]) 07/29/2012  . Varicose veins of lower extremities with ulcer ([place]) 07/08/2012  . Venous ulcer of leg ([place]) 07/08/2012   Past Medical History:  Diagnosis Date  . Arthritis   . Asthma   . CHF (congestive heart failure) ([place]) 03/12/2014   a. EF 40-45% by echo in 06/2017 with normal cors by cath. ICD placed following VT arrest  . Depression   . Headache(784.0)   . History of blood transfusion   . Hyperlipidemia   . Hypertension   . Lung nodules   .  Myocardial infarction ([place]) 06/26/2014  . Renal disorder    followed by [facility]  . Sarcoidosis   . Ulcer    recurring, from chronic venous insufficiency    Family History  Problem Relation Age of Onset  . Heart failure Mother   . Diabetes Mellitus II Mother   . Hypertension Mother   . Cancer Mother        unknown type  . Heart disease Father   . Stroke Father   . Diabetes Mellitus II Father     Past Surgical History:  Procedure Laterality Date  . cataract surgery Left 07-2013  . I&D EXTREMITY Bilateral 12/31/2018   Procedure: DEBRIDEMENT BILATERAL LEGS, APPLY VAC X 2;  Surgeon: [name], MD;  Location:  [facility];  Service: Orthopedics;  Laterality: Bilateral;  . I&D EXTREMITY Bilateral 01/02/2019   Procedure: REPEAT DEBRIDEMENT BILATERAL LEGS, APPLY VAC X 2;  Surgeon: [name], MD;  Location: [facility];  Service: Orthopedics;  Laterality: Bilateral;  . I&D EXTREMITY Bilateral 07/15/2019   Procedure: IRRIGATION AND DEBRIDEMENT VENOUS STASIS INSUFFICIENCY ULCERATIONS BILATERAL LOWER EXTREMITIES;  Surgeon: [name], MD;  Location: [facility];  Service: Orthopedics;  Laterality: Bilateral;  . I&D EXTREMITY Bilateral 08/23/2019   Procedure: DEBRIDEMENT EXTREMITY;  Surgeon: [name], MD;  Location: [facility];  Service: Orthopedics;  Laterality: Bilateral;  . ICD IMPLANT N/A 07/05/2017   Procedure: ICD Implant;  Surgeon: Constance Haw, MD;  Location: [facility];  Service: Cardiovascular;  Laterality: N/A;  . LEFT HEART CATH AND CORONARY ANGIOGRAPHY N/A 07/03/2017   Procedure: Left Heart Cath and Coronary Angiography;  Surgeon: Belva Crome, MD;  Location: [facility];  Service: Cardiovascular;  Laterality: N/A;  . SKIN SPLIT GRAFT Bilateral 01/07/2019   Procedure: SPLIT THICKNESS SKIN GRAFT BILATERAL LEGS, APPLY VAC;  Surgeon: [name], MD;  Location: [facility];  Service: Orthopedics;  Laterality: Bilateral;  . SKIN SPLIT GRAFT Bilateral 07/17/2019    Procedure: REPEAT IRRIGATION AND DEBRIDEMENT BILATERAL LOWER EXTREMITIES, SKIN GRAFT;  Surgeon: [name], MD;  Location: [facility];  Service: Orthopedics;  Laterality: Bilateral;  . SKIN SPLIT GRAFT Bilateral 08/26/2019   Procedure: SKIN GRAFT SPLIT THICKNESS BILATERAL LEGS, APPLY WOUND VAC;  Surgeon: [name], MD;  Location: [facility];  Service: Orthopedics;  Laterality: Bilateral;   Social History   Occupational History  . Not on file  Tobacco Use  . Smoking status: Never Smoker  . Smokeless tobacco: Never Used  Substance and Sexual Activity  . Alcohol use: No  . Drug use: No  . Sexual activity: Not Currently

## 2019-10-29 ENCOUNTER — Encounter: Payer: Self-pay | Admitting: Orthopedic Surgery

## 2019-10-29 ENCOUNTER — Ambulatory Visit (INDEPENDENT_AMBULATORY_CARE_PROVIDER_SITE_OTHER): Payer: Medicare Other | Admitting: Orthopedic Surgery

## 2019-10-29 ENCOUNTER — Other Ambulatory Visit: Payer: Self-pay

## 2019-10-29 VITALS — Ht 67.0 in | Wt 295.6 lb

## 2019-10-29 DIAGNOSIS — I87333 Chronic venous hypertension (idiopathic) with ulcer and inflammation of bilateral lower extremity: Secondary | ICD-10-CM

## 2019-10-29 DIAGNOSIS — L97919 Non-pressure chronic ulcer of unspecified part of right lower leg with unspecified severity: Secondary | ICD-10-CM

## 2019-10-29 DIAGNOSIS — L97929 Non-pressure chronic ulcer of unspecified part of left lower leg with unspecified severity: Secondary | ICD-10-CM

## 2019-10-31 ENCOUNTER — Encounter: Payer: Self-pay | Admitting: Orthopedic Surgery

## 2019-10-31 NOTE — Progress Notes (Signed)
Office Visit Note   Patient: Kari [name]           Date of Birth: [birth year]           MRN: [id number] Visit Date: 10/29/2019              Requested by: Rutherford Guys, MD [address] [place],  [place] [address] PCP: Rutherford Guys, MD  Chief Complaint  Patient presents with  . Right Leg - Routine Post Op  . Left Leg - Routine Post Op      HPI: Patient is a 58 year old woman who presents for bilateral lower extremity venous insufficiency ulcers status post skin graft.  Patient states she cannot put on compression stockings she has too much drainage for once a week dressing changes and she has been following up twice a week for Dynaflex wraps.  Assessment & Plan: Visit Diagnoses:  1. Idiopathic chronic venous hypertension of both lower extremities with ulcer and inflammation ([place])     Plan: We will continue twice a week Dynaflex wraps.  Follow-Up Instructions: Return in about 1 week (around 11/05/2019) for Follow-up biweekly.   Ortho Exam  Patient is alert, oriented, no adenopathy, well-dressed, normal affect, normal respiratory effort. Examination there were a few remaining staples these are removed.  Patient does have increased swelling today she states she took the wraps off yesterday.  There is good healthy granulation tissue there is still too much swelling.  Imaging: No results found. No images are attached to the encounter.  Labs: Lab Results  Component Value Date   HGBA1C 5.8 (H) 08/20/2019   HGBA1C 5.6 12/17/2016   HGBA1C 5.5 03/12/2014   REPTSTATUS 08/28/2019 FINAL 08/23/2019   GRAMSTAIN  08/23/2019    MODERATE WBC PRESENT, PREDOMINANTLY PMN FEW GRAM POSITIVE COCCI IN PAIRS IN CLUSTERS RARE GRAM NEGATIVE RODS    CULT  08/23/2019    FEW PSEUDOMONAS AERUGINOSA FEW PROTEUS MIRABILIS FEW STAPHYLOCOCCUS SPECIES (COAGULASE NEGATIVE) NO ANAEROBES ISOLATED Performed at [facility], [place] [address] [place], [place] [address]    LABORGA  PSEUDOMONAS AERUGINOSA 08/23/2019   LABORGA PROTEUS MIRABILIS 08/23/2019     Lab Results  Component Value Date   ALBUMIN 3.7 (L) 09/28/2019   ALBUMIN 1.9 (L) 08/28/2019   ALBUMIN 2.9 (L) 08/19/2019   PREALBUMIN 19.7 08/31/2019    Lab Results  Component Value Date   MG 2.4 01/05/2019   MG 2.3 01/02/2019   MG 2.0 12/27/2018   Lab Results  Component Value Date   VD25OH 26.7 (L) 08/22/2019    Lab Results  Component Value Date   PREALBUMIN 19.7 08/31/2019   CBC EXTENDED Latest Ref Rng & Units 08/31/2019 08/29/2019 08/28/2019  WBC 4.0 - 10.5 K/uL 5.5 7.6 10.8(H)  RBC 3.87 - 5.11 MIL/uL 2.85(L) 2.73(L) 2.58(L)  HGB 12.0 - 15.0 g/dL 8.2(L) 7.7(L) 7.4(L)  HCT 36.0 - 46.0 % 27.3(L) 26.9(L) 24.9(L)  PLT 150 - 400 K/uL 237 244 256  NEUTROABS 1.7 - 7.7 K/uL 3.5 - 7.5  LYMPHSABS 0.7 - 4.0 K/uL 1.2 - 2.1     Body mass index is 46.3 kg/m.  Orders:  No orders of the defined types were placed in this encounter.  No orders of the defined types were placed in this encounter.    Procedures: No procedures performed  Clinical Data: No additional findings.  ROS:  All other systems negative, except as noted in the HPI. Review of Systems  Objective: Vital Signs: Ht 5\' 7"  (  1.702 m)   Wt 295 lb 9.6 oz (134.1 kg)   LMP 11/21/2011   BMI 46.30 kg/m   Specialty Comments:  No specialty comments available.  PMFS History: Patient Active Problem List   Diagnosis Date Noted  . Multiple open wounds of lower leg   . Moderate protein-calorie malnutrition ([place])   . Non-healing ulcer ([place]) 08/19/2019  . Postoperative pain   . Labile blood pressure   . Nonischemic cardiomyopathy ([place])   . Physical debility 07/20/2019  . Idiopathic chronic venous hypertension of lower extremity with ulcer, bilateral ([place]) 07/15/2019  . H/O skin graft 05/13/2019  . CKD (chronic kidney disease), stage III 01/13/2019  . Infected wound 12/26/2018  . Idiopathic chronic venous hypertension of both lower  extremities with ulcer and inflammation ([place])   . History of fall 05/10/2018  . Hyperkalemia 02/21/2018  . Hypotension 02/21/2018  . Sepsis ([place]) 02/21/2018  . Encounter for central line placement   . Mitral regurgitation 10/30/2017  . Acute kidney injury ([place]) 07/21/2017  . Generalized weakness 07/20/2017  . Shock circulatory ([place])   . Acute respiratory failure with hypoxia ([place])   . Pulmonary edema   . Cardiac arrest ([place]) 06/26/2017  . Ventricular fibrillation ([place])   . Acute on chronic diastolic heart failure ([place])   . Abnormal ECG   . Cellulitis 04/02/2017  . AKI (acute kidney injury) ([place]) 12/17/2016  . Cellulitis and abscess of right lower extremity 12/17/2016  . Syncope and collapse 12/17/2016  . Peripheral edema 12/17/2016  . Hypokalemia 12/17/2016  . Anemia of chronic disease 12/17/2016  . Acute diastolic CHF (congestive heart failure) ([place]) 03/14/2014  . CHF (congestive heart failure) ([place]) 03/12/2014  . Sarcoidosis ([place]) 03/12/2014  . Severe sepsis ([place]) 10/17/2013  . ARF (acute renal failure) ([place]) 10/08/2013  . Cellulitis of multiple sites of lower extremity 10/08/2013  . Morbid obesity ([place]) 10/08/2013  . Volume depletion 10/08/2013  . Venous (peripheral) insufficiency 10/28/2012  . Mediastinal lymphadenopathy 10/16/2012  . Pulmonary nodules 10/16/2012  . 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 Chest pain 09/02/2012  . Asthma   . Hypertension   . Depression   . Venous stasis ulcers ([place]) 07/29/2012  . Varicose veins of lower extremities with ulcer ([place]) 07/08/2012  . Venous ulcer of leg ([place]) 07/08/2012   Past Medical History:  Diagnosis Date  . Arthritis   . Asthma   . CHF (congestive heart failure) ([place]) 03/12/2014   a. EF 40-45% by echo in 06/2017 with normal cors by cath. ICD placed following VT arrest  . Depression   . Headache(784.0)   . History of blood transfusion   . Hyperlipidemia   .  Hypertension   . Lung nodules   . Myocardial infarction ([place]) 06/26/2014  . Renal disorder    followed by [facility]  . Sarcoidosis   . Ulcer    recurring, from chronic venous insufficiency    Family History  Problem Relation Age of Onset  . Heart failure Mother   . Diabetes Mellitus II Mother   . Hypertension Mother   . Cancer Mother        unknown type  . Heart disease Father   . Stroke Father   . Diabetes Mellitus II Father     Past Surgical History:  Procedure Laterality Date  . cataract surgery Left 07-2013  . I&D EXTREMITY Bilateral 12/31/2018   Procedure: DEBRIDEMENT BILATERAL LEGS, APPLY VAC X 2;  Surgeon: Meridee Score  V, MD;  Location: [facility];  Service: Orthopedics;  Laterality: Bilateral;  . I&D EXTREMITY Bilateral 01/02/2019   Procedure: REPEAT DEBRIDEMENT BILATERAL LEGS, APPLY VAC X 2;  Surgeon: [name], MD;  Location: [facility];  Service: Orthopedics;  Laterality: Bilateral;  . I&D EXTREMITY Bilateral 07/15/2019   Procedure: IRRIGATION AND DEBRIDEMENT VENOUS STASIS INSUFFICIENCY ULCERATIONS BILATERAL LOWER EXTREMITIES;  Surgeon: [name], MD;  Location: [facility];  Service: Orthopedics;  Laterality: Bilateral;  . I&D EXTREMITY Bilateral 08/23/2019   Procedure: DEBRIDEMENT EXTREMITY;  Surgeon: [name], MD;  Location: [facility];  Service: Orthopedics;  Laterality: Bilateral;  . ICD IMPLANT N/A 07/05/2017   Procedure: ICD Implant;  Surgeon: Constance Haw, MD;  Location: [facility];  Service: Cardiovascular;  Laterality: N/A;  . LEFT HEART CATH AND CORONARY ANGIOGRAPHY N/A 07/03/2017   Procedure: Left Heart Cath and Coronary Angiography;  Surgeon: Belva Crome, MD;  Location: [facility];  Service: Cardiovascular;  Laterality: N/A;  . SKIN SPLIT GRAFT Bilateral 01/07/2019   Procedure: SPLIT THICKNESS SKIN GRAFT BILATERAL LEGS, APPLY VAC;  Surgeon: [name], MD;  Location: [facility];  Service: Orthopedics;  Laterality: Bilateral;  . SKIN  SPLIT GRAFT Bilateral 07/17/2019   Procedure: REPEAT IRRIGATION AND DEBRIDEMENT BILATERAL LOWER EXTREMITIES, SKIN GRAFT;  Surgeon: [name], MD;  Location: [facility];  Service: Orthopedics;  Laterality: Bilateral;  . SKIN SPLIT GRAFT Bilateral 08/26/2019   Procedure: SKIN GRAFT SPLIT THICKNESS BILATERAL LEGS, APPLY WOUND VAC;  Surgeon: [name], MD;  Location: [facility];  Service: Orthopedics;  Laterality: Bilateral;   Social History   Occupational History  . Not on file  Tobacco Use  . Smoking status: Never Smoker  . Smokeless tobacco: Never Used  Substance and Sexual Activity  . Alcohol use: No  . Drug use: No  . Sexual activity: Not Currently

## 2019-11-02 ENCOUNTER — Ambulatory Visit (INDEPENDENT_AMBULATORY_CARE_PROVIDER_SITE_OTHER): Payer: Medicare Other | Admitting: Orthopedic Surgery

## 2019-11-02 ENCOUNTER — Other Ambulatory Visit: Payer: Self-pay

## 2019-11-02 ENCOUNTER — Encounter: Payer: Self-pay | Admitting: Orthopedic Surgery

## 2019-11-02 VITALS — Ht 67.0 in | Wt 295.0 lb

## 2019-11-02 DIAGNOSIS — L97929 Non-pressure chronic ulcer of unspecified part of left lower leg with unspecified severity: Secondary | ICD-10-CM | POA: Diagnosis not present

## 2019-11-02 DIAGNOSIS — E559 Vitamin D deficiency, unspecified: Secondary | ICD-10-CM | POA: Diagnosis not present

## 2019-11-02 DIAGNOSIS — I87333 Chronic venous hypertension (idiopathic) with ulcer and inflammation of bilateral lower extremity: Secondary | ICD-10-CM

## 2019-11-02 DIAGNOSIS — L97919 Non-pressure chronic ulcer of unspecified part of right lower leg with unspecified severity: Secondary | ICD-10-CM

## 2019-11-02 DIAGNOSIS — M109 Gout, unspecified: Secondary | ICD-10-CM | POA: Diagnosis not present

## 2019-11-02 DIAGNOSIS — N189 Chronic kidney disease, unspecified: Secondary | ICD-10-CM | POA: Diagnosis not present

## 2019-11-02 DIAGNOSIS — N183 Chronic kidney disease, stage 3 unspecified: Secondary | ICD-10-CM | POA: Diagnosis not present

## 2019-11-02 NOTE — Progress Notes (Signed)
Office Visit Note   Patient: Kari [name]           Date of Birth: 01-23-1961           MRN: [id number] Visit Date: 11/02/2019              Requested by: Rutherford Guys, MD [address] [place],  [place] [address] PCP: Rutherford Guys, MD  Chief Complaint  Patient presents with  . Right Leg - Routine Post Op    08/24/19 debridement BLE STSG  . Left Leg - Routine Post Op      HPI: Patient is a 58 year old woman who presents in follow-up for bilateral lower extremity venous ulcer status post split-thickness skin graft.  Patient is following up twice a week for dressing changes she is pleased with her progress and would like to continue with twice a week dressing changes.  Assessment & Plan: Visit Diagnoses:  1. Idiopathic chronic venous hypertension of both lower extremities with ulcer and inflammation ([place])     Plan: Follow-up Mondays and Thursdays for Dynaflex wraps.  Follow-Up Instructions: Return in about 1 week (around 11/09/2019) for Follow-up Monday and Thursdays.   Ortho Exam  Patient is alert, oriented, no adenopathy, well-dressed, normal affect, normal respiratory effort. Examination the swelling has decreased the ulcers are dry there is superficial epithelialization around the wound edges.  Imaging: No results found. No images are attached to the encounter.  Labs: Lab Results  Component Value Date   HGBA1C 5.8 (H) 08/20/2019   HGBA1C 5.6 12/17/2016   HGBA1C 5.5 03/12/2014   REPTSTATUS 08/28/2019 FINAL 08/23/2019   GRAMSTAIN  08/23/2019    MODERATE WBC PRESENT, PREDOMINANTLY PMN FEW GRAM POSITIVE COCCI IN PAIRS IN CLUSTERS RARE GRAM NEGATIVE RODS    CULT  08/23/2019    FEW PSEUDOMONAS AERUGINOSA FEW PROTEUS MIRABILIS FEW STAPHYLOCOCCUS SPECIES (COAGULASE NEGATIVE) NO ANAEROBES ISOLATED Performed at [facility], [place] [address] [place], [place] [address]    LABORGA PSEUDOMONAS AERUGINOSA 08/23/2019   LABORGA PROTEUS MIRABILIS 08/23/2019      Lab Results  Component Value Date   ALBUMIN 3.7 (L) 09/28/2019   ALBUMIN 1.9 (L) 08/28/2019   ALBUMIN 2.9 (L) 08/19/2019   PREALBUMIN 19.7 08/31/2019    Lab Results  Component Value Date   MG 2.4 01/05/2019   MG 2.3 01/02/2019   MG 2.0 12/27/2018   Lab Results  Component Value Date   VD25OH 26.7 (L) 08/22/2019    Lab Results  Component Value Date   PREALBUMIN 19.7 08/31/2019   CBC EXTENDED Latest Ref Rng & Units 08/31/2019 08/29/2019 08/28/2019  WBC 4.0 - 10.5 K/uL 5.5 7.6 10.8(H)  RBC 3.87 - 5.11 MIL/uL 2.85(L) 2.73(L) 2.58(L)  HGB 12.0 - 15.0 g/dL 8.2(L) 7.7(L) 7.4(L)  HCT 36.0 - 46.0 % 27.3(L) 26.9(L) 24.9(L)  PLT 150 - 400 K/uL 237 244 256  NEUTROABS 1.7 - 7.7 K/uL 3.5 - 7.5  LYMPHSABS 0.7 - 4.0 K/uL 1.2 - 2.1     Body mass index is 46.2 kg/m.  Orders:  No orders of the defined types were placed in this encounter.  No orders of the defined types were placed in this encounter.    Procedures: No procedures performed  Clinical Data: No additional findings.  ROS:  All other systems negative, except as noted in the HPI. Review of Systems  Objective: Vital Signs: Ht 5\' 7"  (1.702 m)   Wt 295 lb (133.8 kg)   LMP 11/21/2011  BMI 46.20 kg/m   Specialty Comments:  No specialty comments available.  PMFS History: Patient Active Problem List   Diagnosis Date Noted  . Multiple open wounds of lower leg   . Moderate protein-calorie malnutrition ([place])   . Non-healing ulcer ([place]) 08/19/2019  . Postoperative pain   . Labile blood pressure   . Nonischemic cardiomyopathy ([place])   . Physical debility 07/20/2019  . Idiopathic chronic venous hypertension of lower extremity with ulcer, bilateral ([place]) 07/15/2019  . H/O skin graft 05/13/2019  . CKD (chronic kidney disease), stage III 01/13/2019  . Infected wound 12/26/2018  . Idiopathic chronic venous hypertension of both lower extremities with ulcer and inflammation ([place])   . History of fall 05/10/2018  .  Hyperkalemia 02/21/2018  . Hypotension 02/21/2018  . Sepsis ([place]) 02/21/2018  . Encounter for central line placement   . Mitral regurgitation 10/30/2017  . Acute kidney injury ([place]) 07/21/2017  . Generalized weakness 07/20/2017  . Shock circulatory ([place])   . Acute respiratory failure with hypoxia ([place])   . Pulmonary edema   . Cardiac arrest ([place]) 06/26/2017  . Ventricular fibrillation ([place])   . Acute on chronic diastolic heart failure ([place])   . Abnormal ECG   . Cellulitis 04/02/2017  . AKI (acute kidney injury) ([place]) 12/17/2016  . Cellulitis and abscess of right lower extremity 12/17/2016  . Syncope and collapse 12/17/2016  . Peripheral edema 12/17/2016  . Hypokalemia 12/17/2016  . Anemia of chronic disease 12/17/2016  . Acute diastolic CHF (congestive heart failure) ([place]) 03/14/2014  . CHF (congestive heart failure) ([place]) 03/12/2014  . Sarcoidosis ([place]) 03/12/2014  . Severe sepsis ([place]) 10/17/2013  . ARF (acute renal failure) ([place]) 10/08/2013  . Cellulitis of multiple sites of lower extremity 10/08/2013  . Morbid obesity ([place]) 10/08/2013  . Volume depletion 10/08/2013  . Venous (peripheral) insufficiency 10/28/2012  . Mediastinal lymphadenopathy 10/16/2012  . Pulmonary nodules 10/16/2012  . 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 Chest pain 09/02/2012  . Asthma   . Hypertension   . Depression   . Venous stasis ulcers ([place]) 07/29/2012  . Varicose veins of lower extremities with ulcer ([place]) 07/08/2012  . Venous ulcer of leg ([place]) 07/08/2012   Past Medical History:  Diagnosis Date  . Arthritis   . Asthma   . CHF (congestive heart failure) ([place]) 03/12/2014   a. EF 40-45% by echo in 06/2017 with normal cors by cath. ICD placed following VT arrest  . Depression   . Headache(784.0)   . History of blood transfusion   . Hyperlipidemia   . Hypertension   . Lung nodules   . Myocardial infarction ([place]) 06/26/2014  . Renal  disorder    followed by [facility]  . Sarcoidosis   . Ulcer    recurring, from chronic venous insufficiency    Family History  Problem Relation Age of Onset  . Heart failure Mother   . Diabetes Mellitus II Mother   . Hypertension Mother   . Cancer Mother        unknown type  . Heart disease Father   . Stroke Father   . Diabetes Mellitus II Father     Past Surgical History:  Procedure Laterality Date  . cataract surgery Left 07-2013  . I&D EXTREMITY Bilateral 12/31/2018   Procedure: DEBRIDEMENT BILATERAL LEGS, APPLY VAC X 2;  Surgeon: [name], MD;  Location: [facility];  Service: Orthopedics;  Laterality: Bilateral;  . I&D EXTREMITY Bilateral  01/02/2019   Procedure: REPEAT DEBRIDEMENT BILATERAL LEGS, APPLY VAC X 2;  Surgeon: [name], MD;  Location: [facility];  Service: Orthopedics;  Laterality: Bilateral;  . I&D EXTREMITY Bilateral 07/15/2019   Procedure: IRRIGATION AND DEBRIDEMENT VENOUS STASIS INSUFFICIENCY ULCERATIONS BILATERAL LOWER EXTREMITIES;  Surgeon: [name], MD;  Location: [facility];  Service: Orthopedics;  Laterality: Bilateral;  . I&D EXTREMITY Bilateral 08/23/2019   Procedure: DEBRIDEMENT EXTREMITY;  Surgeon: [name], MD;  Location: [facility];  Service: Orthopedics;  Laterality: Bilateral;  . ICD IMPLANT N/A 07/05/2017   Procedure: ICD Implant;  Surgeon: Constance Haw, MD;  Location: [facility];  Service: Cardiovascular;  Laterality: N/A;  . LEFT HEART CATH AND CORONARY ANGIOGRAPHY N/A 07/03/2017   Procedure: Left Heart Cath and Coronary Angiography;  Surgeon: Belva Crome, MD;  Location: [facility];  Service: Cardiovascular;  Laterality: N/A;  . SKIN SPLIT GRAFT Bilateral 01/07/2019   Procedure: SPLIT THICKNESS SKIN GRAFT BILATERAL LEGS, APPLY VAC;  Surgeon: [name], MD;  Location: [facility];  Service: Orthopedics;  Laterality: Bilateral;  . SKIN SPLIT GRAFT Bilateral 07/17/2019   Procedure: REPEAT IRRIGATION AND DEBRIDEMENT BILATERAL  LOWER EXTREMITIES, SKIN GRAFT;  Surgeon: [name], MD;  Location: [facility];  Service: Orthopedics;  Laterality: Bilateral;  . SKIN SPLIT GRAFT Bilateral 08/26/2019   Procedure: SKIN GRAFT SPLIT THICKNESS BILATERAL LEGS, APPLY WOUND VAC;  Surgeon: [name], MD;  Location: [facility];  Service: Orthopedics;  Laterality: Bilateral;   Social History   Occupational History  . Not on file  Tobacco Use  . Smoking status: Never Smoker  . Smokeless tobacco: Never Used  Substance and Sexual Activity  . Alcohol use: No  . Drug use: No  . Sexual activity: Not Currently

## 2019-11-05 ENCOUNTER — Encounter: Payer: Self-pay | Admitting: Orthopedic Surgery

## 2019-11-05 ENCOUNTER — Ambulatory Visit (INDEPENDENT_AMBULATORY_CARE_PROVIDER_SITE_OTHER): Payer: Medicare Other | Admitting: Orthopedic Surgery

## 2019-11-05 ENCOUNTER — Other Ambulatory Visit: Payer: Self-pay

## 2019-11-05 VITALS — Ht 67.0 in | Wt 295.0 lb

## 2019-11-05 DIAGNOSIS — L97919 Non-pressure chronic ulcer of unspecified part of right lower leg with unspecified severity: Secondary | ICD-10-CM

## 2019-11-05 DIAGNOSIS — L97929 Non-pressure chronic ulcer of unspecified part of left lower leg with unspecified severity: Secondary | ICD-10-CM | POA: Diagnosis not present

## 2019-11-05 DIAGNOSIS — I87333 Chronic venous hypertension (idiopathic) with ulcer and inflammation of bilateral lower extremity: Secondary | ICD-10-CM

## 2019-11-05 NOTE — Progress Notes (Signed)
Office Visit Note   Patient: Kari [name]           Date of Birth: 01-15-1961           MRN: [id number] Visit Date: 11/05/2019              Requested by: Rutherford Guys, MD [address] [place],  [place] [address] PCP: Rutherford Guys, MD  Chief Complaint  Patient presents with  . Right Leg - Routine Post Op    08/23/19 debridement bilateral lower extremities.   . Left Leg - Routine Post Op      HPI: Patient is a 58 year old woman who presents in follow-up status post skin graft for both lower extremities with venous stasis swelling.  Patient has had difficulty with the wound care and is now following up twice a week with good results.  Assessment & Plan: Visit Diagnoses:  1. Idiopathic chronic venous hypertension of both lower extremities with ulcer and inflammation ([place])     Plan: Will apply small amount of Iodosorb plus 4 x 4 plus a Dynaflex wrap to both legs.  Want to clean up a little bit of the fibrinous exudative tissue.  Follow-Up Instructions: Return in about 1 week (around 11/12/2019) for Follow-up Mondays and Thursdays.Manson Passey Exam  Patient is alert, oriented, no adenopathy, well-dressed, normal affect, normal respiratory effort. Examination patient's wounds are healing there is epithelization around the wound edges there is no cellulitis patient states her legs feel much better she denies any pain states the swelling has decreased.  Imaging: No results found. No images are attached to the encounter.  Labs: Lab Results  Component Value Date   HGBA1C 5.8 (H) 08/20/2019   HGBA1C 5.6 12/17/2016   HGBA1C 5.5 03/12/2014   REPTSTATUS 08/28/2019 FINAL 08/23/2019   GRAMSTAIN  08/23/2019    MODERATE WBC PRESENT, PREDOMINANTLY PMN FEW GRAM POSITIVE COCCI IN PAIRS IN CLUSTERS RARE GRAM NEGATIVE RODS    CULT  08/23/2019    FEW PSEUDOMONAS AERUGINOSA FEW PROTEUS MIRABILIS FEW STAPHYLOCOCCUS SPECIES (COAGULASE NEGATIVE) NO ANAEROBES ISOLATED Performed at  [facility], [place] [address] [place], [place] [address]    LABORGA PSEUDOMONAS AERUGINOSA 08/23/2019   LABORGA PROTEUS MIRABILIS 08/23/2019     Lab Results  Component Value Date   ALBUMIN 3.7 (L) 09/28/2019   ALBUMIN 1.9 (L) 08/28/2019   ALBUMIN 2.9 (L) 08/19/2019   PREALBUMIN 19.7 08/31/2019    Lab Results  Component Value Date   MG 2.4 01/05/2019   MG 2.3 01/02/2019   MG 2.0 12/27/2018   Lab Results  Component Value Date   VD25OH 26.7 (L) 08/22/2019    Lab Results  Component Value Date   PREALBUMIN 19.7 08/31/2019   CBC EXTENDED Latest Ref Rng & Units 08/31/2019 08/29/2019 08/28/2019  WBC 4.0 - 10.5 K/uL 5.5 7.6 10.8(H)  RBC 3.87 - 5.11 MIL/uL 2.85(L) 2.73(L) 2.58(L)  HGB 12.0 - 15.0 g/dL 8.2(L) 7.7(L) 7.4(L)  HCT 36.0 - 46.0 % 27.3(L) 26.9(L) 24.9(L)  PLT 150 - 400 K/uL 237 244 256  NEUTROABS 1.7 - 7.7 K/uL 3.5 - 7.5  LYMPHSABS 0.7 - 4.0 K/uL 1.2 - 2.1     Body mass index is 46.2 kg/m.  Orders:  No orders of the defined types were placed in this encounter.  No orders of the defined types were placed in this encounter.    Procedures: No procedures performed  Clinical Data: No additional findings.  ROS:  All other  systems negative, except as noted in the HPI. Review of Systems  Objective: Vital Signs: Ht 5\' 7"  (1.702 m)   Wt 295 lb (133.8 kg)   LMP 11/21/2011   BMI 46.20 kg/m   Specialty Comments:  No specialty comments available.  PMFS History: Patient Active Problem List   Diagnosis Date Noted  . Multiple open wounds of lower leg   . Moderate protein-calorie malnutrition ([place])   . Non-healing ulcer ([place]) 08/19/2019  . Postoperative pain   . Labile blood pressure   . Nonischemic cardiomyopathy ([place])   . Physical debility 07/20/2019  . Idiopathic chronic venous hypertension of lower extremity with ulcer, bilateral ([place]) 07/15/2019  . H/O skin graft 05/13/2019  . CKD (chronic kidney disease), stage III 01/13/2019  . Infected wound  12/26/2018  . Idiopathic chronic venous hypertension of both lower extremities with ulcer and inflammation ([place])   . History of fall 05/10/2018  . Hyperkalemia 02/21/2018  . Hypotension 02/21/2018  . Sepsis ([place]) 02/21/2018  . Encounter for central line placement   . Mitral regurgitation 10/30/2017  . Acute kidney injury ([place]) 07/21/2017  . Generalized weakness 07/20/2017  . Shock circulatory ([place])   . Acute respiratory failure with hypoxia ([place])   . Pulmonary edema   . Cardiac arrest ([place]) 06/26/2017  . Ventricular fibrillation ([place])   . Acute on chronic diastolic heart failure ([place])   . Abnormal ECG   . Cellulitis 04/02/2017  . AKI (acute kidney injury) ([employer]) 12/17/2016  . Cellulitis and abscess of right lower extremity 12/17/2016  . Syncope and collapse 12/17/2016  . Peripheral edema 12/17/2016  . Hypokalemia 12/17/2016  . Anemia of chronic disease 12/17/2016  . Acute diastolic CHF (congestive heart failure) ([place]) 03/14/2014  . CHF (congestive heart failure) ([place]) 03/12/2014  . Sarcoidosis ([place]) 03/12/2014  . Severe sepsis ([place]) 10/17/2013  . ARF (acute renal failure) ([place]) 10/08/2013  . Cellulitis of multiple sites of lower extremity 10/08/2013  . Morbid obesity ([place]) 10/08/2013  . Volume depletion 10/08/2013  . Venous (peripheral) insufficiency 10/28/2012  . Mediastinal lymphadenopathy 10/16/2012  . Pulmonary nodules 10/16/2012  . 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 Chest pain 09/02/2012  . Asthma   . Hypertension   . Depression   . Venous stasis ulcers ([place]) 07/29/2012  . Varicose veins of lower extremities with ulcer ([place]) 07/08/2012  . Venous ulcer of leg ([place]) 07/08/2012   Past Medical History:  Diagnosis Date  . Arthritis   . Asthma   . CHF (congestive heart failure) ([place]) 03/12/2014   a. EF 40-45% by echo in 06/2017 with normal cors by cath. ICD placed following VT arrest  . Depression   .  Headache(784.0)   . History of blood transfusion   . Hyperlipidemia   . Hypertension   . Lung nodules   . Myocardial infarction ([place]) 06/26/2014  . Renal disorder    followed by [facility]  . Sarcoidosis   . Ulcer    recurring, from chronic venous insufficiency    Family History  Problem Relation Age of Onset  . Heart failure Mother   . Diabetes Mellitus II Mother   . Hypertension Mother   . Cancer Mother        unknown type  . Heart disease Father   . Stroke Father   . Diabetes Mellitus II Father     Past Surgical History:  Procedure Laterality Date  . cataract surgery Left 07-2013  . I&D EXTREMITY  Bilateral 12/31/2018   Procedure: DEBRIDEMENT BILATERAL LEGS, APPLY VAC X 2;  Surgeon: [name], MD;  Location: [facility];  Service: Orthopedics;  Laterality: Bilateral;  . I&D EXTREMITY Bilateral 01/02/2019   Procedure: REPEAT DEBRIDEMENT BILATERAL LEGS, APPLY VAC X 2;  Surgeon: [name], MD;  Location: [facility];  Service: Orthopedics;  Laterality: Bilateral;  . I&D EXTREMITY Bilateral 07/15/2019   Procedure: IRRIGATION AND DEBRIDEMENT VENOUS STASIS INSUFFICIENCY ULCERATIONS BILATERAL LOWER EXTREMITIES;  Surgeon: [name], MD;  Location: [facility];  Service: Orthopedics;  Laterality: Bilateral;  . I&D EXTREMITY Bilateral 08/23/2019   Procedure: DEBRIDEMENT EXTREMITY;  Surgeon: [name], MD;  Location: [facility];  Service: Orthopedics;  Laterality: Bilateral;  . ICD IMPLANT N/A 07/05/2017   Procedure: ICD Implant;  Surgeon: Constance Haw, MD;  Location: [facility];  Service: Cardiovascular;  Laterality: N/A;  . LEFT HEART CATH AND CORONARY ANGIOGRAPHY N/A 07/03/2017   Procedure: Left Heart Cath and Coronary Angiography;  Surgeon: Belva Crome, MD;  Location: [facility];  Service: Cardiovascular;  Laterality: N/A;  . SKIN SPLIT GRAFT Bilateral 01/07/2019   Procedure: SPLIT THICKNESS SKIN GRAFT BILATERAL LEGS, APPLY VAC;  Surgeon: [name], MD;   Location: [facility];  Service: Orthopedics;  Laterality: Bilateral;  . SKIN SPLIT GRAFT Bilateral 07/17/2019   Procedure: REPEAT IRRIGATION AND DEBRIDEMENT BILATERAL LOWER EXTREMITIES, SKIN GRAFT;  Surgeon: [name], MD;  Location: [facility];  Service: Orthopedics;  Laterality: Bilateral;  . SKIN SPLIT GRAFT Bilateral 08/26/2019   Procedure: SKIN GRAFT SPLIT THICKNESS BILATERAL LEGS, APPLY WOUND VAC;  Surgeon: [name], MD;  Location: [facility];  Service: Orthopedics;  Laterality: Bilateral;   Social History   Occupational History  . Not on file  Tobacco Use  . Smoking status: Never Smoker  . Smokeless tobacco: Never Used  Substance and Sexual Activity  . Alcohol use: No  . Drug use: No  . Sexual activity: Not Currently

## 2019-11-06 DIAGNOSIS — I87313 Chronic venous hypertension (idiopathic) with ulcer of bilateral lower extremity: Secondary | ICD-10-CM | POA: Diagnosis not present

## 2019-11-06 DIAGNOSIS — B965 Pseudomonas (aeruginosa) (mallei) (pseudomallei) as the cause of diseases classified elsewhere: Secondary | ICD-10-CM | POA: Diagnosis not present

## 2019-11-06 DIAGNOSIS — B964 Proteus (mirabilis) (morganii) as the cause of diseases classified elsewhere: Secondary | ICD-10-CM | POA: Diagnosis not present

## 2019-11-06 DIAGNOSIS — I96 Gangrene, not elsewhere classified: Secondary | ICD-10-CM | POA: Diagnosis not present

## 2019-11-06 DIAGNOSIS — T8131XA Disruption of external operation (surgical) wound, not elsewhere classified, initial encounter: Secondary | ICD-10-CM | POA: Diagnosis not present

## 2019-11-06 DIAGNOSIS — T8149XA Infection following a procedure, other surgical site, initial encounter: Secondary | ICD-10-CM | POA: Diagnosis not present

## 2019-11-09 ENCOUNTER — Ambulatory Visit: Payer: Medicare Other | Admitting: Orthopedic Surgery

## 2019-11-09 DIAGNOSIS — B964 Proteus (mirabilis) (morganii) as the cause of diseases classified elsewhere: Secondary | ICD-10-CM | POA: Diagnosis not present

## 2019-11-09 DIAGNOSIS — B965 Pseudomonas (aeruginosa) (mallei) (pseudomallei) as the cause of diseases classified elsewhere: Secondary | ICD-10-CM | POA: Diagnosis not present

## 2019-11-09 DIAGNOSIS — T8131XA Disruption of external operation (surgical) wound, not elsewhere classified, initial encounter: Secondary | ICD-10-CM | POA: Diagnosis not present

## 2019-11-09 DIAGNOSIS — I87313 Chronic venous hypertension (idiopathic) with ulcer of bilateral lower extremity: Secondary | ICD-10-CM | POA: Diagnosis not present

## 2019-11-09 DIAGNOSIS — T8149XA Infection following a procedure, other surgical site, initial encounter: Secondary | ICD-10-CM | POA: Diagnosis not present

## 2019-11-09 DIAGNOSIS — I96 Gangrene, not elsewhere classified: Secondary | ICD-10-CM | POA: Diagnosis not present

## 2019-11-10 ENCOUNTER — Ambulatory Visit (INDEPENDENT_AMBULATORY_CARE_PROVIDER_SITE_OTHER): Payer: Medicare Other | Admitting: Physician Assistant

## 2019-11-10 ENCOUNTER — Encounter: Payer: Self-pay | Admitting: Physician Assistant

## 2019-11-10 ENCOUNTER — Other Ambulatory Visit: Payer: Self-pay

## 2019-11-10 VITALS — Ht 67.0 in | Wt 295.0 lb

## 2019-11-10 DIAGNOSIS — L97929 Non-pressure chronic ulcer of unspecified part of left lower leg with unspecified severity: Secondary | ICD-10-CM | POA: Diagnosis not present

## 2019-11-10 DIAGNOSIS — L97919 Non-pressure chronic ulcer of unspecified part of right lower leg with unspecified severity: Secondary | ICD-10-CM

## 2019-11-10 DIAGNOSIS — I87333 Chronic venous hypertension (idiopathic) with ulcer and inflammation of bilateral lower extremity: Secondary | ICD-10-CM

## 2019-11-10 NOTE — Progress Notes (Signed)
Office Visit Note   Patient: Kari [name]           Date of Birth: 05/24/1961           MRN: [id number] Visit Date: 11/10/2019              Requested by: Rutherford Guys, MD [address] [place],  [place] [address] PCP: Rutherford Guys, MD  Chief Complaint  Patient presents with  . Left Leg - Routine Post Op    08/26/2019 BIL LE SG  . Right Leg - Routine Post Op      HPI: [place] for follow up Bilateral LE Ulcers. Feels she is improving.   Assessment & Plan: Visit Diagnoses: No diagnosis found.  Plan: Iodosorb, dynaflex Wraps   Follow-Up Instructions: No follow-ups on file.   Ortho Exam  Patient is alert, oriented, no adenopathy, well-dressed, normal affect, normal respiratory effort.B LE No drainage.Healthy ingrowth of skin. Swelling Controlled..Compartments soft.Healthy granulation tissue centrally.  Imaging: No results found. No images are attached to the encounter.  Labs: Lab Results  Component Value Date   HGBA1C 5.8 (H) 08/20/2019   HGBA1C 5.6 12/17/2016   HGBA1C 5.5 03/12/2014   REPTSTATUS 08/28/2019 FINAL 08/23/2019   GRAMSTAIN  08/23/2019    MODERATE WBC PRESENT, PREDOMINANTLY PMN FEW GRAM POSITIVE COCCI IN PAIRS IN CLUSTERS RARE GRAM NEGATIVE RODS    CULT  08/23/2019    FEW PSEUDOMONAS AERUGINOSA FEW PROTEUS MIRABILIS FEW STAPHYLOCOCCUS SPECIES (COAGULASE NEGATIVE) NO ANAEROBES ISOLATED Performed at [facility], [place] [address], [place], [place] [address]    LABORGA PSEUDOMONAS AERUGINOSA 08/23/2019   LABORGA PROTEUS MIRABILIS 08/23/2019     Lab Results  Component Value Date   ALBUMIN 3.7 (L) 09/28/2019   ALBUMIN 1.9 (L) 08/28/2019   ALBUMIN 2.9 (L) 08/19/2019   PREALBUMIN 19.7 08/31/2019    Lab Results  Component Value Date   MG 2.4 01/05/2019   MG 2.3 01/02/2019   MG 2.0 12/27/2018   Lab Results  Component Value Date   VD25OH 26.7 (L) 08/22/2019    Lab Results  Component Value Date   PREALBUMIN 19.7 08/31/2019    CBC EXTENDED Latest Ref Rng & Units 08/31/2019 08/29/2019 08/28/2019  WBC 4.0 - 10.5 K/uL 5.5 7.6 10.8(H)  RBC 3.87 - 5.11 MIL/uL 2.85(L) 2.73(L) 2.58(L)  HGB 12.0 - 15.0 g/dL 8.2(L) 7.7(L) 7.4(L)  HCT 36.0 - 46.0 % 27.3(L) 26.9(L) 24.9(L)  PLT 150 - 400 K/uL 237 244 256  NEUTROABS 1.7 - 7.7 K/uL 3.5 - 7.5  LYMPHSABS 0.7 - 4.0 K/uL 1.2 - 2.1     Body mass index is 46.2 kg/m.  Orders:  No orders of the defined types were placed in this encounter.  No orders of the defined types were placed in this encounter.    Procedures: No procedures performed  Clinical Data: No additional findings.  ROS:  All other systems negative, except as noted in the HPI. Review of Systems  Objective: Vital Signs: Ht 5\' 7"  (1.702 m)   Wt 295 lb (133.8 kg)   LMP 11/21/2011   BMI 46.20 kg/m   Specialty Comments:  No specialty comments available.  PMFS History: Patient Active Problem List   Diagnosis Date Noted  . Multiple open wounds of lower leg   . Moderate protein-calorie malnutrition ([place])   . Non-healing ulcer ([place]) 08/19/2019  . Postoperative pain   . Labile blood pressure   . Nonischemic cardiomyopathy ([place])   . Physical debility  07/20/2019  . Idiopathic chronic venous hypertension of lower extremity with ulcer, bilateral ([place]) 07/15/2019  . H/O skin graft 05/13/2019  . CKD (chronic kidney disease), stage III 01/13/2019  . Infected wound 12/26/2018  . Idiopathic chronic venous hypertension of both lower extremities with ulcer and inflammation ([place])   . History of fall 05/10/2018  . Hyperkalemia 02/21/2018  . Hypotension 02/21/2018  . Sepsis ([place]) 02/21/2018  . Encounter for central line placement   . Mitral regurgitation 10/30/2017  . Acute kidney injury ([place]) 07/21/2017  . Generalized weakness 07/20/2017  . Shock circulatory ([place])   . Acute respiratory failure with hypoxia ([place])   . Pulmonary edema   . Cardiac arrest ([place]) 06/26/2017  . Ventricular fibrillation ([place])   .  Acute on chronic diastolic heart failure ([place])   . Abnormal ECG   . Cellulitis 04/02/2017  . AKI (acute kidney injury) ([place]) 12/17/2016  . Cellulitis and abscess of right lower extremity 12/17/2016  . Syncope and collapse 12/17/2016  . Peripheral edema 12/17/2016  . Hypokalemia 12/17/2016  . Anemia of chronic disease 12/17/2016  . Acute diastolic CHF (congestive heart failure) ([place]) 03/14/2014  . CHF (congestive heart failure) ([place]) 03/12/2014  . Sarcoidosis ([place]) 03/12/2014  . Severe sepsis ([place]) 10/17/2013  . ARF (acute renal failure) ([place]) 10/08/2013  . Cellulitis of multiple sites of lower extremity 10/08/2013  . Morbid obesity ([place]) 10/08/2013  . Volume depletion 10/08/2013  . Venous (peripheral) insufficiency 10/28/2012  . Mediastinal lymphadenopathy 10/16/2012  . Pulmonary nodules 10/16/2012  . 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 Chest pain 09/02/2012  . Asthma   . Hypertension   . Depression   . Venous stasis ulcers ([place]) 07/29/2012  . Varicose veins of lower extremities with ulcer ([place]) 07/08/2012  . Venous ulcer of leg ([place]) 07/08/2012   Past Medical History:  Diagnosis Date  . Arthritis   . Asthma   . CHF (congestive heart failure) ([place]) 03/12/2014   a. EF 40-45% by echo in 06/2017 with normal cors by cath. ICD placed following VT arrest  . Depression   . Headache(784.0)   . History of blood transfusion   . Hyperlipidemia   . Hypertension   . Lung nodules   . Myocardial infarction ([place]) 06/26/2014  . Renal disorder    followed by [facility]  . Sarcoidosis   . Ulcer    recurring, from chronic venous insufficiency    Family History  Problem Relation Age of Onset  . Heart failure Mother   . Diabetes Mellitus II Mother   . Hypertension Mother   . Cancer Mother        unknown type  . Heart disease Father   . Stroke Father   . Diabetes Mellitus II Father     Past Surgical History:  Procedure  Laterality Date  . cataract surgery Left 07-2013  . I&D EXTREMITY Bilateral 12/31/2018   Procedure: DEBRIDEMENT BILATERAL LEGS, APPLY VAC X 2;  Surgeon: [name], MD;  Location: [facility];  Service: Orthopedics;  Laterality: Bilateral;  . I&D EXTREMITY Bilateral 01/02/2019   Procedure: REPEAT DEBRIDEMENT BILATERAL LEGS, APPLY VAC X 2;  Surgeon: [name], MD;  Location: [facility];  Service: Orthopedics;  Laterality: Bilateral;  . I&D EXTREMITY Bilateral 07/15/2019   Procedure: IRRIGATION AND DEBRIDEMENT VENOUS STASIS INSUFFICIENCY ULCERATIONS BILATERAL LOWER EXTREMITIES;  Surgeon: [name], MD;  Location: [facility];  Service: Orthopedics;  Laterality: Bilateral;  . I&D EXTREMITY  Bilateral 08/23/2019   Procedure: DEBRIDEMENT EXTREMITY;  Surgeon: [name], MD;  Location: [facility];  Service: Orthopedics;  Laterality: Bilateral;  . ICD IMPLANT N/A 07/05/2017   Procedure: ICD Implant;  Surgeon: Constance Haw, MD;  Location: [facility];  Service: Cardiovascular;  Laterality: N/A;  . LEFT HEART CATH AND CORONARY ANGIOGRAPHY N/A 07/03/2017   Procedure: Left Heart Cath and Coronary Angiography;  Surgeon: Belva Crome, MD;  Location: [facility];  Service: Cardiovascular;  Laterality: N/A;  . SKIN SPLIT GRAFT Bilateral 01/07/2019   Procedure: SPLIT THICKNESS SKIN GRAFT BILATERAL LEGS, APPLY VAC;  Surgeon: [name], MD;  Location: [facility];  Service: Orthopedics;  Laterality: Bilateral;  . SKIN SPLIT GRAFT Bilateral 07/17/2019   Procedure: REPEAT IRRIGATION AND DEBRIDEMENT BILATERAL LOWER EXTREMITIES, SKIN GRAFT;  Surgeon: [name], MD;  Location: [facility];  Service: Orthopedics;  Laterality: Bilateral;  . SKIN SPLIT GRAFT Bilateral 08/26/2019   Procedure: SKIN GRAFT SPLIT THICKNESS BILATERAL LEGS, APPLY WOUND VAC;  Surgeon: [name], MD;  Location: [facility];  Service: Orthopedics;  Laterality: Bilateral;   Social History   Occupational History  . Not on file  Tobacco Use   . Smoking status: Never Smoker  . Smokeless tobacco: Never Used  Substance and Sexual Activity  . Alcohol use: No  . Drug use: No  . Sexual activity: Not Currently

## 2019-11-12 ENCOUNTER — Ambulatory Visit: Payer: Medicare Other | Admitting: Orthopedic Surgery

## 2019-11-12 DIAGNOSIS — D631 Anemia in chronic kidney disease: Secondary | ICD-10-CM | POA: Diagnosis not present

## 2019-11-12 DIAGNOSIS — N183 Chronic kidney disease, stage 3 unspecified: Secondary | ICD-10-CM | POA: Diagnosis not present

## 2019-11-12 DIAGNOSIS — I129 Hypertensive chronic kidney disease with stage 1 through stage 4 chronic kidney disease, or unspecified chronic kidney disease: Secondary | ICD-10-CM | POA: Diagnosis not present

## 2019-11-12 DIAGNOSIS — M109 Gout, unspecified: Secondary | ICD-10-CM | POA: Diagnosis not present

## 2019-11-12 DIAGNOSIS — E559 Vitamin D deficiency, unspecified: Secondary | ICD-10-CM | POA: Diagnosis not present

## 2019-11-16 ENCOUNTER — Other Ambulatory Visit: Payer: Self-pay

## 2019-11-16 ENCOUNTER — Ambulatory Visit (INDEPENDENT_AMBULATORY_CARE_PROVIDER_SITE_OTHER): Payer: Medicare Other | Admitting: Orthopedic Surgery

## 2019-11-16 ENCOUNTER — Encounter: Payer: Self-pay | Admitting: Orthopedic Surgery

## 2019-11-16 VITALS — Ht 67.0 in | Wt 295.0 lb

## 2019-11-16 DIAGNOSIS — I87333 Chronic venous hypertension (idiopathic) with ulcer and inflammation of bilateral lower extremity: Secondary | ICD-10-CM | POA: Diagnosis not present

## 2019-11-16 DIAGNOSIS — L97929 Non-pressure chronic ulcer of unspecified part of left lower leg with unspecified severity: Secondary | ICD-10-CM | POA: Diagnosis not present

## 2019-11-16 DIAGNOSIS — L97919 Non-pressure chronic ulcer of unspecified part of right lower leg with unspecified severity: Secondary | ICD-10-CM

## 2019-11-16 NOTE — Progress Notes (Signed)
Office Visit Note   Patient: Kari [name]           Date of Birth: 1961/10/16           MRN: [id number] Visit Date: 11/16/2019              Requested by: Rutherford Guys, MD [address] [place],  [place] [address] PCP: Rutherford Guys, MD  Chief Complaint  Patient presents with  . Right Leg - Follow-up  . Left Leg - Follow-up      HPI: Patient is a 58 year old woman who presents in follow-up for bilateral lower extremity venous ulcers.  Assessment & Plan: Visit Diagnoses:  1. Idiopathic chronic venous hypertension of both lower extremities with ulcer and inflammation (HCC)     Plan: Follow-up 2 times a week.  Apply Iodosorb to 4 x 4 gauze plus a Dynaflex compression wrap bilaterally.  Follow-Up Instructions: Return in about 1 week (around 11/23/2019) for Follow-up 2 times a week.Manson Passey Exam  Patient is alert, oriented, no adenopathy, well-dressed, normal affect, normal respiratory effort. Examination patient has improving granulation tissue decreased swelling decreased drainage there is no cellulitis.  Imaging: No results found. No images are attached to the encounter.  Labs: Lab Results  Component Value Date   HGBA1C 5.8 (H) 08/20/2019   HGBA1C 5.6 12/17/2016   HGBA1C 5.5 03/12/2014   REPTSTATUS 08/28/2019 FINAL 08/23/2019   GRAMSTAIN  08/23/2019    MODERATE WBC PRESENT, PREDOMINANTLY PMN FEW GRAM POSITIVE COCCI IN PAIRS IN CLUSTERS RARE GRAM NEGATIVE RODS    CULT  08/23/2019    FEW PSEUDOMONAS AERUGINOSA FEW PROTEUS MIRABILIS FEW STAPHYLOCOCCUS SPECIES (COAGULASE NEGATIVE) NO ANAEROBES ISOLATED Performed at [facility], [place] [address] [place], [place] [address]    LABORGA PSEUDOMONAS AERUGINOSA 08/23/2019   LABORGA PROTEUS MIRABILIS 08/23/2019     Lab Results  Component Value Date   ALBUMIN 3.7 (L) 09/28/2019   ALBUMIN 1.9 (L) 08/28/2019   ALBUMIN 2.9 (L) 08/19/2019   PREALBUMIN 19.7 08/31/2019    Lab Results  Component  Value Date   MG 2.4 01/05/2019   MG 2.3 01/02/2019   MG 2.0 12/27/2018   Lab Results  Component Value Date   VD25OH 26.7 (L) 08/22/2019    Lab Results  Component Value Date   PREALBUMIN 19.7 08/31/2019   CBC EXTENDED Latest Ref Rng & Units 08/31/2019 08/29/2019 08/28/2019  WBC 4.0 - 10.5 K/uL 5.5 7.6 10.8(H)  RBC 3.87 - 5.11 MIL/uL 2.85(L) 2.73(L) 2.58(L)  HGB 12.0 - 15.0 g/dL 8.2(L) 7.7(L) 7.4(L)  HCT 36.0 - 46.0 % 27.3(L) 26.9(L) 24.9(L)  PLT 150 - 400 K/uL 237 244 256  NEUTROABS 1.7 - 7.7 K/uL 3.5 - 7.5  LYMPHSABS 0.7 - 4.0 K/uL 1.2 - 2.1     Body mass index is 46.2 kg/m.  Orders:  No orders of the defined types were placed in this encounter.  No orders of the defined types were placed in this encounter.    Procedures: No procedures performed  Clinical Data: No additional findings.  ROS:  All other systems negative, except as noted in the HPI. Review of Systems  Objective: Vital Signs: Ht 5\' 7"  (1.702 m)   Wt 295 lb (133.8 kg)   LMP 11/21/2011   BMI 46.20 kg/m   Specialty Comments:  No specialty comments available.  PMFS History: Patient Active Problem List   Diagnosis Date Noted  . Multiple open wounds of lower leg   .  Moderate protein-calorie malnutrition ([place])   . Non-healing ulcer ([place]) 08/19/2019  . Postoperative pain   . Labile blood pressure   . Nonischemic cardiomyopathy ([place])   . Physical debility 07/20/2019  . Idiopathic chronic venous hypertension of lower extremity with ulcer, bilateral ([place]) 07/15/2019  . H/O skin graft 05/13/2019  . CKD (chronic kidney disease), stage III 01/13/2019  . Infected wound 12/26/2018  . Idiopathic chronic venous hypertension of both lower extremities with ulcer and inflammation ([place])   . History of fall 05/10/2018  . Hyperkalemia 02/21/2018  . Hypotension 02/21/2018  . Sepsis ([place]) 02/21/2018  . Encounter for central line placement   . Mitral regurgitation 10/30/2017  . Acute kidney injury ([place])  07/21/2017  . Generalized weakness 07/20/2017  . Shock circulatory ([place])   . Acute respiratory failure with hypoxia ([place])   . Pulmonary edema   . Cardiac arrest ([place]) 06/26/2017  . Ventricular fibrillation ([place])   . Acute on chronic diastolic heart failure ([place])   . Abnormal ECG   . Cellulitis 04/02/2017  . AKI (acute kidney injury) ([place]) 12/17/2016  . Cellulitis and abscess of right lower extremity 12/17/2016  . Syncope and collapse 12/17/2016  . Peripheral edema 12/17/2016  . Hypokalemia 12/17/2016  . Anemia of chronic disease 12/17/2016  . Acute diastolic CHF (congestive heart failure) ([place]) 03/14/2014  . CHF (congestive heart failure) ([place]) 03/12/2014  . Sarcoidosis ([place]) 03/12/2014  . Severe sepsis ([place]) 10/17/2013  . ARF (acute renal failure) ([place]) 10/08/2013  . Cellulitis of multiple sites of lower extremity 10/08/2013  . Morbid obesity ([name]) 10/08/2013  . Volume depletion 10/08/2013  . Venous (peripheral) insufficiency 10/28/2012  . Mediastinal lymphadenopathy 10/16/2012  . Pulmonary nodules 10/16/2012  . 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 Chest pain 09/02/2012  . Asthma   . Hypertension   . Depression   . Venous stasis ulcers ([place]) 07/29/2012  . Varicose veins of lower extremities with ulcer ([place]) 07/08/2012  . Venous ulcer of leg ([place]) 07/08/2012   Past Medical History:  Diagnosis Date  . Arthritis   . Asthma   . CHF (congestive heart failure) ([place]) 03/12/2014   a. EF 40-45% by echo in 06/2017 with normal cors by cath. ICD placed following VT arrest  . Depression   . Headache(784.0)   . History of blood transfusion   . Hyperlipidemia   . Hypertension   . Lung nodules   . Myocardial infarction ([place]) 06/26/2014  . Renal disorder    followed by [facility]  . Sarcoidosis   . Ulcer    recurring, from chronic venous insufficiency    Family History  Problem Relation Age of Onset  . Heart failure  Mother   . Diabetes Mellitus II Mother   . Hypertension Mother   . Cancer Mother        unknown type  . Heart disease Father   . Stroke Father   . Diabetes Mellitus II Father     Past Surgical History:  Procedure Laterality Date  . cataract surgery Left 07-2013  . I&D EXTREMITY Bilateral 12/31/2018   Procedure: DEBRIDEMENT BILATERAL LEGS, APPLY VAC X 2;  Surgeon: [name], MD;  Location: [facility];  Service: Orthopedics;  Laterality: Bilateral;  . I&D EXTREMITY Bilateral 01/02/2019   Procedure: REPEAT DEBRIDEMENT BILATERAL LEGS, APPLY VAC X 2;  Surgeon: [name], MD;  Location: [facility];  Service: Orthopedics;  Laterality: Bilateral;  . I&D EXTREMITY Bilateral 07/15/2019  Procedure: IRRIGATION AND DEBRIDEMENT VENOUS STASIS INSUFFICIENCY ULCERATIONS BILATERAL LOWER EXTREMITIES;  Surgeon: [name], MD;  Location: [facility];  Service: Orthopedics;  Laterality: Bilateral;  . I&D EXTREMITY Bilateral 08/23/2019   Procedure: DEBRIDEMENT EXTREMITY;  Surgeon: [name], MD;  Location: [facility];  Service: Orthopedics;  Laterality: Bilateral;  . ICD IMPLANT N/A 07/05/2017   Procedure: ICD Implant;  Surgeon: Constance Haw, MD;  Location: [facility];  Service: Cardiovascular;  Laterality: N/A;  . LEFT HEART CATH AND CORONARY ANGIOGRAPHY N/A 07/03/2017   Procedure: Left Heart Cath and Coronary Angiography;  Surgeon: Belva Crome, MD;  Location: [facility];  Service: Cardiovascular;  Laterality: N/A;  . SKIN SPLIT GRAFT Bilateral 01/07/2019   Procedure: SPLIT THICKNESS SKIN GRAFT BILATERAL LEGS, APPLY VAC;  Surgeon: [name], MD;  Location: [facility];  Service: Orthopedics;  Laterality: Bilateral;  . SKIN SPLIT GRAFT Bilateral 07/17/2019   Procedure: REPEAT IRRIGATION AND DEBRIDEMENT BILATERAL LOWER EXTREMITIES, SKIN GRAFT;  Surgeon: [name], MD;  Location: [facility];  Service: Orthopedics;  Laterality: Bilateral;  . SKIN SPLIT GRAFT Bilateral 08/26/2019   Procedure: SKIN  GRAFT SPLIT THICKNESS BILATERAL LEGS, APPLY WOUND VAC;  Surgeon: [name], MD;  Location: [facility];  Service: Orthopedics;  Laterality: Bilateral;   Social History   Occupational History  . Not on file  Tobacco Use  . Smoking status: Never Smoker  . Smokeless tobacco: Never Used  Substance and Sexual Activity  . Alcohol use: No  . Drug use: No  . Sexual activity: Not Currently

## 2019-11-18 ENCOUNTER — Telehealth: Payer: Self-pay | Admitting: Orthopedic Surgery

## 2019-11-18 ENCOUNTER — Ambulatory Visit (INDEPENDENT_AMBULATORY_CARE_PROVIDER_SITE_OTHER): Payer: Medicare Other | Admitting: Family

## 2019-11-18 ENCOUNTER — Telehealth: Payer: Self-pay

## 2019-11-18 ENCOUNTER — Encounter: Payer: Self-pay | Admitting: Family

## 2019-11-18 ENCOUNTER — Other Ambulatory Visit: Payer: Self-pay

## 2019-11-18 VITALS — Ht 67.0 in | Wt 295.0 lb

## 2019-11-18 DIAGNOSIS — I87333 Chronic venous hypertension (idiopathic) with ulcer and inflammation of bilateral lower extremity: Secondary | ICD-10-CM

## 2019-11-18 DIAGNOSIS — L97929 Non-pressure chronic ulcer of unspecified part of left lower leg with unspecified severity: Secondary | ICD-10-CM | POA: Diagnosis not present

## 2019-11-18 DIAGNOSIS — L97919 Non-pressure chronic ulcer of unspecified part of right lower leg with unspecified severity: Secondary | ICD-10-CM

## 2019-11-18 DIAGNOSIS — Z945 Skin transplant status: Secondary | ICD-10-CM | POA: Diagnosis not present

## 2019-11-18 MED ORDER — HYDROCODONE-ACETAMINOPHEN 5-325 MG PO TABS: 1.0000 | ORAL_TABLET | Freq: Four times a day (QID) | ORAL | 0 refills | Status: DC | PRN

## 2019-11-18 NOTE — Telephone Encounter (Signed)
Returned call to [employer]) left message with appointment date and time for the patient

## 2019-11-18 NOTE — Progress Notes (Signed)
Office Visit Note   Patient: Kari [name]           Date of Birth: 15-Jan-1961           MRN: [id number] Visit Date: 11/18/2019              Requested by: Rutherford Guys, MD [address] [place],  [place] [address] PCP: Rutherford Guys, MD  Chief Complaint  Patient presents with  . Left Leg - Follow-up  . Right Leg - Follow-up      HPI: Patient is a 58 year old woman who presents in follow-up for chronic bilateral lower extremity venous ulcers.  We have been doing serial compression wraps twice weekly.  The patient prefers to come to the office for dressing changes.  Most recently has been having Iodosorb dressings as well as compressive wraps.  Assessment & Plan: Visit Diagnoses:  1. Idiopathic chronic venous hypertension of both lower extremities with ulcer and inflammation ([place])   2. H/O skin graft     Plan: We will continue with her current wound care.  Follow-up 2 times a week.  Apply Iodosorb to 4 x 4 gauze plus a Dynaflex compression wrap bilaterally.  We will plan to harvest staples at next visit.  Follow-Up Instructions: Return in about 5 days (around 11/23/2019).   Ortho Exam  Patient is alert, oriented, no adenopathy, well-dressed, normal affect, normal respiratory effort. Examination patient has improving granulation tissue.  There is circumferential epithelialization to the wound edges.  Some dried fibrinous tissue and eschar was debrided today.  There are scattered staples in place however the patient declines removal today.  There is no active drainage today on exam there is improved edema to the lower extremities no surrounding erythema no odor no sign of infection  Imaging: No results found. No images are attached to the encounter.  Labs: Lab Results  Component Value Date   HGBA1C 5.8 (H) 08/20/2019   HGBA1C 5.6 12/17/2016   HGBA1C 5.5 03/12/2014   REPTSTATUS 08/28/2019 FINAL 08/23/2019   GRAMSTAIN  08/23/2019    MODERATE WBC PRESENT,  PREDOMINANTLY PMN FEW GRAM POSITIVE COCCI IN PAIRS IN CLUSTERS RARE GRAM NEGATIVE RODS    CULT  08/23/2019    FEW PSEUDOMONAS AERUGINOSA FEW PROTEUS MIRABILIS FEW STAPHYLOCOCCUS SPECIES (COAGULASE NEGATIVE) NO ANAEROBES ISOLATED Performed at [facility], [place] [address] [place], [place] [address]    LABORGA PSEUDOMONAS AERUGINOSA 08/23/2019   LABORGA PROTEUS MIRABILIS 08/23/2019     Lab Results  Component Value Date   ALBUMIN 3.7 (L) 09/28/2019   ALBUMIN 1.9 (L) 08/28/2019   ALBUMIN 2.9 (L) 08/19/2019   PREALBUMIN 19.7 08/31/2019    Lab Results  Component Value Date   MG 2.4 01/05/2019   MG 2.3 01/02/2019   MG 2.0 12/27/2018   Lab Results  Component Value Date   VD25OH 26.7 (L) 08/22/2019    Lab Results  Component Value Date   PREALBUMIN 19.7 08/31/2019   CBC EXTENDED Latest Ref Rng & Units 08/31/2019 08/29/2019 08/28/2019  WBC 4.0 - 10.5 K/uL 5.5 7.6 10.8(H)  RBC 3.87 - 5.11 MIL/uL 2.85(L) 2.73(L) 2.58(L)  HGB 12.0 - 15.0 g/dL 8.2(L) 7.7(L) 7.4(L)  HCT 36.0 - 46.0 % 27.3(L) 26.9(L) 24.9(L)  PLT 150 - 400 K/uL 237 244 256  NEUTROABS 1.7 - 7.7 K/uL 3.5 - 7.5  LYMPHSABS 0.7 - 4.0 K/uL 1.2 - 2.1     Body mass index is 46.2 kg/m.  Orders:  No orders of  the defined types were placed in this encounter.  Meds ordered this encounter  Medications  . HYDROcodone-acetaminophen (NORCO/VICODIN) 5-325 MG tablet    Sig: Take 1 tablet by mouth every 6 (six) hours as needed for moderate pain.    Dispense:  30 tablet    Refill:  0     Procedures: No procedures performed  Clinical Data: No additional findings.  ROS:  All other systems negative, except as noted in the HPI. Review of Systems  Constitutional: Negative for chills and fever.  Cardiovascular: Positive for leg swelling.  Skin: Positive for wound. Negative for color change and rash.    Objective: Vital Signs: Ht 5\' 7"  (1.702 m)   Wt 295 lb (133.8 kg)   LMP 11/21/2011   BMI 46.20 kg/m    Specialty Comments:  No specialty comments available.  PMFS History: Patient Active Problem List   Diagnosis Date Noted  . Multiple open wounds of lower leg   . Moderate protein-calorie malnutrition ([place])   . Non-healing ulcer ([place]) 08/19/2019  . Postoperative pain   . Labile blood pressure   . Nonischemic cardiomyopathy ([place])   . Physical debility 07/20/2019  . Idiopathic chronic venous hypertension of lower extremity with ulcer, bilateral ([place]) 07/15/2019  . H/O skin graft 05/13/2019  . CKD (chronic kidney disease), stage III 01/13/2019  . Infected wound 12/26/2018  . Idiopathic chronic venous hypertension of both lower extremities with ulcer and inflammation ([name])   . History of fall 05/10/2018  . Hyperkalemia 02/21/2018  . Hypotension 02/21/2018  . Sepsis ([place]) 02/21/2018  . Encounter for central line placement   . Mitral regurgitation 10/30/2017  . Acute kidney injury ([place]) 07/21/2017  . Generalized weakness 07/20/2017  . Shock circulatory ([place])   . Acute respiratory failure with hypoxia ([place])   . Pulmonary edema   . Cardiac arrest ([place]) 06/26/2017  . Ventricular fibrillation ([place])   . Acute on chronic diastolic heart failure ([place])   . Abnormal ECG   . Cellulitis 04/02/2017  . AKI (acute kidney injury) ([place]) 12/17/2016  . Cellulitis and abscess of right lower extremity 12/17/2016  . Syncope and collapse 12/17/2016  . Peripheral edema 12/17/2016  . Hypokalemia 12/17/2016  . Anemia of chronic disease 12/17/2016  . Acute diastolic CHF (congestive heart failure) ([place]) 03/14/2014  . CHF (congestive heart failure) ([place]) 03/12/2014  . Sarcoidosis ([place]) 03/12/2014  . Severe sepsis ([place]) 10/17/2013  . ARF (acute renal failure) ([place]) 10/08/2013  . Cellulitis of multiple sites of lower extremity 10/08/2013  . Morbid obesity ([place]) 10/08/2013  . Volume depletion 10/08/2013  . Venous (peripheral) insufficiency 10/28/2012  . Mediastinal lymphadenopathy 10/16/2012  . Pulmonary  nodules 10/16/2012  . 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 Chest pain 09/02/2012  . Asthma   . Hypertension   . Depression   . Venous stasis ulcers ([place]) 07/29/2012  . Varicose veins of lower extremities with ulcer ([place]) 07/08/2012  . Venous ulcer of leg ([place]) 07/08/2012   Past Medical History:  Diagnosis Date  . Arthritis   . Asthma   . CHF (congestive heart failure) ([place]) 03/12/2014   a. EF 40-45% by echo in 06/2017 with normal cors by cath. ICD placed following VT arrest  . Depression   . Headache(784.0)   . History of blood transfusion   . Hyperlipidemia   . Hypertension   . Lung nodules   . Myocardial infarction ([place]) 06/26/2014  . Renal disorder    followed  by [facility]  . Sarcoidosis   . Ulcer    recurring, from chronic venous insufficiency    Family History  Problem Relation Age of Onset  . Heart failure Mother   . Diabetes Mellitus II Mother   . Hypertension Mother   . Cancer Mother        unknown type  . Heart disease Father   . Stroke Father   . Diabetes Mellitus II Father     Past Surgical History:  Procedure Laterality Date  . cataract surgery Left 07-2013  . I&D EXTREMITY Bilateral 12/31/2018   Procedure: DEBRIDEMENT BILATERAL LEGS, APPLY VAC X 2;  Surgeon: [name], MD;  Location: [facility];  Service: Orthopedics;  Laterality: Bilateral;  . I&D EXTREMITY Bilateral 01/02/2019   Procedure: REPEAT DEBRIDEMENT BILATERAL LEGS, APPLY VAC X 2;  Surgeon: [name], MD;  Location: [facility];  Service: Orthopedics;  Laterality: Bilateral;  . I&D EXTREMITY Bilateral 07/15/2019   Procedure: IRRIGATION AND DEBRIDEMENT VENOUS STASIS INSUFFICIENCY ULCERATIONS BILATERAL LOWER EXTREMITIES;  Surgeon: [name], MD;  Location: [facility];  Service: Orthopedics;  Laterality: Bilateral;  . I&D EXTREMITY Bilateral 08/23/2019   Procedure: DEBRIDEMENT EXTREMITY;  Surgeon: [name], MD;  Location: [facility];   Service: Orthopedics;  Laterality: Bilateral;  . ICD IMPLANT N/A 07/05/2017   Procedure: ICD Implant;  Surgeon: Constance Haw, MD;  Location: [facility];  Service: Cardiovascular;  Laterality: N/A;  . LEFT HEART CATH AND CORONARY ANGIOGRAPHY N/A 07/03/2017   Procedure: Left Heart Cath and Coronary Angiography;  Surgeon: Belva Crome, MD;  Location: [facility];  Service: Cardiovascular;  Laterality: N/A;  . SKIN SPLIT GRAFT Bilateral 01/07/2019   Procedure: SPLIT THICKNESS SKIN GRAFT BILATERAL LEGS, APPLY VAC;  Surgeon: [name], MD;  Location: [facility];  Service: Orthopedics;  Laterality: Bilateral;  . SKIN SPLIT GRAFT Bilateral 07/17/2019   Procedure: REPEAT IRRIGATION AND DEBRIDEMENT BILATERAL LOWER EXTREMITIES, SKIN GRAFT;  Surgeon: [name], MD;  Location: [facility];  Service: Orthopedics;  Laterality: Bilateral;  . SKIN SPLIT GRAFT Bilateral 08/26/2019   Procedure: SKIN GRAFT SPLIT THICKNESS BILATERAL LEGS, APPLY WOUND VAC;  Surgeon: [name], MD;  Location: [facility];  Service: Orthopedics;  Laterality: Bilateral;   Social History   Occupational History  . Not on file  Tobacco Use  . Smoking status: Never Smoker  . Smokeless tobacco: Never Used  Substance and Sexual Activity  . Alcohol use: No  . Drug use: No  . Sexual activity: Not Currently

## 2019-11-18 NOTE — Telephone Encounter (Signed)

## 2019-11-20 DIAGNOSIS — T8149XA Infection following a procedure, other surgical site, initial encounter: Secondary | ICD-10-CM | POA: Diagnosis not present

## 2019-11-20 DIAGNOSIS — T8131XA Disruption of external operation (surgical) wound, not elsewhere classified, initial encounter: Secondary | ICD-10-CM | POA: Diagnosis not present

## 2019-11-20 DIAGNOSIS — B965 Pseudomonas (aeruginosa) (mallei) (pseudomallei) as the cause of diseases classified elsewhere: Secondary | ICD-10-CM | POA: Diagnosis not present

## 2019-11-20 DIAGNOSIS — I96 Gangrene, not elsewhere classified: Secondary | ICD-10-CM | POA: Diagnosis not present

## 2019-11-20 DIAGNOSIS — B964 Proteus (mirabilis) (morganii) as the cause of diseases classified elsewhere: Secondary | ICD-10-CM | POA: Diagnosis not present

## 2019-11-20 DIAGNOSIS — I87313 Chronic venous hypertension (idiopathic) with ulcer of bilateral lower extremity: Secondary | ICD-10-CM | POA: Diagnosis not present

## 2019-11-21 NOTE — Progress Notes (Signed)
Virtual Visit via Telephone Note   This visit type was conducted due to national recommendations for restrictions regarding the COVID-19 Pandemic (e.g. social distancing) in an effort to limit this patient's exposure and mitigate transmission in our community.  Due to her co-morbid illnesses, this patient is at least at moderate risk for complications without adequate follow up.  This format is felt to be most appropriate for this patient at this time.  The patient did not have access to video technology/had technical difficulties with video requiring transitioning to audio format only (telephone).  All issues noted in this document were discussed and addressed.  No physical exam could be performed with this format.  Please refer to the patient's chart for her  consent to telehealth for [facility].   Date:  11/23/2019   ID:  Kari [name], Kari [name] [birth year], MRN [id number]  Patient Location: Home Provider Location: Home  PCP:  Rutherford Guys, MD  Cardiologist:  Harrington Challenger Electrophysiologist:  Will Meredith Leeds, MD   Evaluation Performed:  Follow-Up Visit  Chief Complaint:  F/U of CHF  History of Present Illness:    Kari [name] is a 58 y.o. female with a hx of nonischemic cardiomyopathy, VF  Arrest in 2018.  Echo at the time showed an LVEF of 40 to 45% with  diffuse hypokinesis worse in the inferior base.  Cardiac catheterization was also done this showed normal coronary arteries.  The patient underwent ICD implant  She also ahs a hx of  HTN, sarcoid  She was last seen by Elliot Cousin in Oct 2020  I saw her in Dec 2018    The patient says she has some lower extremity edema.  This is improved since being placed on a diuretic by her kidney doctor (cannot remember name).  Her breathing is okay.  She denies chest tightness.  No dizziness. The patient does not have symptoms concerning for COVID-19 infection (fever, chills, cough, or new shortness of breath).    Past Medical History:   Diagnosis Date  . Arthritis   . Asthma   . CHF (congestive heart failure) ([place]) 03/12/2014   a. EF 40-45% by echo in 06/2017 with normal cors by cath. ICD placed following VT arrest  . Depression   . Headache(784.0)   . History of blood transfusion   . Hyperlipidemia   . Hypertension   . Lung nodules   . Myocardial infarction ([place]) 06/26/2014  . Renal disorder    followed by [facility]  . Sarcoidosis   . Ulcer    recurring, from chronic venous insufficiency   Past Surgical History:  Procedure Laterality Date  . cataract surgery Left 07-2013  . I&D EXTREMITY Bilateral 12/31/2018   Procedure: DEBRIDEMENT BILATERAL LEGS, APPLY VAC X 2;  Surgeon: [name], MD;  Location: [place];  Service: Orthopedics;  Laterality: Bilateral;  . I&D EXTREMITY Bilateral 01/02/2019   Procedure: REPEAT DEBRIDEMENT BILATERAL LEGS, APPLY VAC X 2;  Surgeon: [name], MD;  Location: [place];  Service: Orthopedics;  Laterality: Bilateral;  . I&D EXTREMITY Bilateral 07/15/2019   Procedure: IRRIGATION AND DEBRIDEMENT VENOUS STASIS INSUFFICIENCY ULCERATIONS BILATERAL LOWER EXTREMITIES;  Surgeon: [name], MD;  Location: [place];  Service: Orthopedics;  Laterality: Bilateral;  . I&D EXTREMITY Bilateral 08/23/2019   Procedure: DEBRIDEMENT EXTREMITY;  Surgeon: [name], MD;  Location: [place];  Service: Orthopedics;  Laterality: Bilateral;  . ICD IMPLANT N/A 07/05/2017   Procedure: ICD Implant;  Surgeon:  Constance Haw, MD;  Location: [place] [facility];  Service: Cardiovascular;  Laterality: N/A;  . LEFT HEART CATH AND CORONARY ANGIOGRAPHY N/A 07/03/2017   Procedure: Left Heart Cath and Coronary Angiography;  Surgeon: Belva Crome, MD;  Location: [place] CV LAB;  Service: Cardiovascular;  Laterality: N/A;  . SKIN SPLIT GRAFT Bilateral 01/07/2019   Procedure: SPLIT THICKNESS SKIN GRAFT BILATERAL LEGS, APPLY VAC;  Surgeon: [name], MD;  Location: [place];  Service: Orthopedics;   Laterality: Bilateral;  . SKIN SPLIT GRAFT Bilateral 07/17/2019   Procedure: REPEAT IRRIGATION AND DEBRIDEMENT BILATERAL LOWER EXTREMITIES, SKIN GRAFT;  Surgeon: [name], MD;  Location: [place];  Service: Orthopedics;  Laterality: Bilateral;  . SKIN SPLIT GRAFT Bilateral 08/26/2019   Procedure: SKIN GRAFT SPLIT THICKNESS BILATERAL LEGS, APPLY WOUND VAC;  Surgeon: [name], MD;  Location: [place];  Service: Orthopedics;  Laterality: Bilateral;     Current Meds  Medication Sig  . acetaminophen (TYLENOL) 325 MG tablet Take 1-2 tablets (325-650 mg total) by mouth every 4 (four) hours as needed for mild pain.  Marland Kitchen albuterol (PROVENTIL HFA;VENTOLIN HFA) 108 (90 Base) MCG/ACT inhaler Inhale 2 puffs into the lungs every 6 (six) hours as needed for shortness of breath.  . allopurinol (ZYLOPRIM) 100 MG tablet Take 2 tablets (200 mg total) by mouth daily.  Marland Kitchen aspirin EC 81 MG tablet Take 81 mg by mouth daily.  . carvedilol (COREG) 6.25 MG tablet Take 1 tablet (6.25 mg total) by mouth 2 (two) times daily.  . furosemide (LASIX) 40 MG tablet Take 40 mg by mouth daily.  Marland Kitchen HYDROcodone-acetaminophen (NORCO/VICODIN) 5-325 MG tablet Take 1 tablet by mouth every 6 (six) hours as needed for moderate pain.  . polyethylene glycol (MIRALAX / GLYCOLAX) 17 g packet Take 17 g by mouth daily as needed.  . senna-docusate (SENOKOT-S) 8.6-50 MG tablet Take 2 tablets by mouth 2 (two) times daily.     Allergies:   Tramadol   Social History   Tobacco Use  . Smoking status: Never Smoker  . Smokeless tobacco: Never Used  Substance Use Topics  . Alcohol use: No  . Drug use: No     Family Hx: The patient's family history includes Cancer in her mother; Diabetes Mellitus II in her father and mother; Heart disease in her father; Heart failure in her mother; Hypertension in her mother; Stroke in her father.  ROS:   Please see the history of present illness.     All other systems reviewed and are negative.   Prior  CV studies:   The following studies were reviewed today: Cath and echo from 2018 reviewed    Labs/Other Tests and Data Reviewed:    EKG:  No ECG reviewed.  Recent Labs: 01/05/2019: Magnesium 2.4 08/31/2019: Hemoglobin 8.2; Platelets 237 09/28/2019: ALT 6; BUN 11; Creatinine, Ser 1.13; Potassium 4.0; Sodium 140   Recent Lipid Panel Lab Results  Component Value Date/Time   CHOL 180 09/28/2019 05:14 PM   TRIG 171 (H) 09/28/2019 05:14 PM   HDL 47 09/28/2019 05:14 PM   CHOLHDL 3.8 09/28/2019 05:14 PM   CHOLHDL 2.5 06/21/2012 09:50 AM   LDLCALC 103 (H) 09/28/2019 05:14 PM    Wt Readings from Last 3 Encounters:  11/23/19 297 lb (134.7 kg)  11/18/19 295 lb (133.8 kg)  11/16/19 295 lb (133.8 kg)     Objective:    Vital Signs:  Ht 5\' 7"  (1.702 m)   Wt 297  lb (134.7 kg)   LMP 11/21/2011   BMI 46.52 kg/m    No vital signs   ASSESSMENT & PLAN:    1. Nonischemic CM     Last evaluation in 2018   I would recomm an echo to reeval LV function      2   S/p VF arrest   Pt with ICD  3   Renal   Pt followed at [facility]   WIll get records and labs    4   HTN  BP will need to be followed     5  COVID-19 Education: The signs and symptoms of COVID-19 were discussed with the patient and how to seek care for testing (follow up with PCP or arrange E-visit).  The importance of social distancing was discussed today.  Time:   Today, I have spent 2minutes with the patient with telehealth technology discussing the above problems.     Medication Adjustments/Labs and Tests Ordered: Current medicines are reviewed at length with the patient today.  Concerns regarding medicines are outlined above.   Tests Ordered: No orders of the defined types were placed in this encounter.   Medication Changes: No orders of the defined types were placed in this encounter.   Follow Up:   Tentative in March/APril   May change depending ofn echo findings    Signed, Dorris Carnes, MD  11/23/2019 8:35 AM     [place]

## 2019-11-23 ENCOUNTER — Telehealth: Payer: Self-pay | Admitting: Orthopedic Surgery

## 2019-11-23 ENCOUNTER — Encounter: Payer: Self-pay | Admitting: Physician Assistant

## 2019-11-23 ENCOUNTER — Telehealth (INDEPENDENT_AMBULATORY_CARE_PROVIDER_SITE_OTHER): Payer: Medicare Other | Admitting: Internal Medicine

## 2019-11-23 ENCOUNTER — Ambulatory Visit (INDEPENDENT_AMBULATORY_CARE_PROVIDER_SITE_OTHER): Payer: Medicare Other | Admitting: Physician Assistant

## 2019-11-23 ENCOUNTER — Ambulatory Visit: Payer: Medicare Other | Admitting: Physician Assistant

## 2019-11-23 ENCOUNTER — Other Ambulatory Visit: Payer: Self-pay

## 2019-11-23 VITALS — Ht 67.0 in | Wt 297.0 lb

## 2019-11-23 DIAGNOSIS — I340 Nonrheumatic mitral (valve) insufficiency: Secondary | ICD-10-CM

## 2019-11-23 DIAGNOSIS — I87333 Chronic venous hypertension (idiopathic) with ulcer and inflammation of bilateral lower extremity: Secondary | ICD-10-CM

## 2019-11-23 DIAGNOSIS — L97919 Non-pressure chronic ulcer of unspecified part of right lower leg with unspecified severity: Secondary | ICD-10-CM

## 2019-11-23 DIAGNOSIS — M1990 Unspecified osteoarthritis, unspecified site: Secondary | ICD-10-CM | POA: Diagnosis not present

## 2019-11-23 DIAGNOSIS — J45909 Unspecified asthma, uncomplicated: Secondary | ICD-10-CM | POA: Diagnosis not present

## 2019-11-23 DIAGNOSIS — D869 Sarcoidosis, unspecified: Secondary | ICD-10-CM | POA: Diagnosis not present

## 2019-11-23 DIAGNOSIS — I872 Venous insufficiency (chronic) (peripheral): Secondary | ICD-10-CM | POA: Diagnosis not present

## 2019-11-23 DIAGNOSIS — E785 Hyperlipidemia, unspecified: Secondary | ICD-10-CM | POA: Diagnosis not present

## 2019-11-23 DIAGNOSIS — I5033 Acute on chronic diastolic (congestive) heart failure: Secondary | ICD-10-CM | POA: Diagnosis not present

## 2019-11-23 DIAGNOSIS — L97929 Non-pressure chronic ulcer of unspecified part of left lower leg with unspecified severity: Secondary | ICD-10-CM | POA: Diagnosis not present

## 2019-11-23 DIAGNOSIS — Z6841 Body Mass Index (BMI) 40.0 and over, adult: Secondary | ICD-10-CM | POA: Diagnosis not present

## 2019-11-23 DIAGNOSIS — B964 Proteus (mirabilis) (morganii) as the cause of diseases classified elsewhere: Secondary | ICD-10-CM | POA: Diagnosis not present

## 2019-11-23 DIAGNOSIS — B965 Pseudomonas (aeruginosa) (mallei) (pseudomallei) as the cause of diseases classified elsewhere: Secondary | ICD-10-CM | POA: Diagnosis not present

## 2019-11-23 DIAGNOSIS — F329 Major depressive disorder, single episode, unspecified: Secondary | ICD-10-CM | POA: Diagnosis not present

## 2019-11-23 DIAGNOSIS — I428 Other cardiomyopathies: Secondary | ICD-10-CM

## 2019-11-23 DIAGNOSIS — I96 Gangrene, not elsewhere classified: Secondary | ICD-10-CM | POA: Diagnosis not present

## 2019-11-23 DIAGNOSIS — Z95810 Presence of automatic (implantable) cardiac defibrillator: Secondary | ICD-10-CM | POA: Diagnosis not present

## 2019-11-23 DIAGNOSIS — D631 Anemia in chronic kidney disease: Secondary | ICD-10-CM | POA: Diagnosis not present

## 2019-11-23 DIAGNOSIS — I252 Old myocardial infarction: Secondary | ICD-10-CM | POA: Diagnosis not present

## 2019-11-23 DIAGNOSIS — T8131XD Disruption of external operation (surgical) wound, not elsewhere classified, subsequent encounter: Secondary | ICD-10-CM | POA: Diagnosis not present

## 2019-11-23 DIAGNOSIS — I130 Hypertensive heart and chronic kidney disease with heart failure and stage 1 through stage 4 chronic kidney disease, or unspecified chronic kidney disease: Secondary | ICD-10-CM | POA: Diagnosis not present

## 2019-11-23 DIAGNOSIS — T8149XD Infection following a procedure, other surgical site, subsequent encounter: Secondary | ICD-10-CM | POA: Diagnosis not present

## 2019-11-23 DIAGNOSIS — R911 Solitary pulmonary nodule: Secondary | ICD-10-CM | POA: Diagnosis not present

## 2019-11-23 DIAGNOSIS — E440 Moderate protein-calorie malnutrition: Secondary | ICD-10-CM | POA: Diagnosis not present

## 2019-11-23 NOTE — Telephone Encounter (Signed)
Kari [name] called. She would like verbal orders for home PT 2x wk, 5 wks, 1x wk 4 wks. Her call back number is [phone]

## 2019-11-23 NOTE — Patient Instructions (Addendum)
Medication Instructions:  Your physician recommends that you continue on your current medications as directed. Please refer to the Current Medication list given to you today.  *If you need a refill on your cardiac medications before your next appointment, please call your pharmacy*  Lab Work: none If you have labs (blood work) drawn today and your tests are completely normal, you will receive your results only by: Marland Kitchen MyChart Message (if you have MyChart) OR . A paper copy in the mail If you have any lab test that is abnormal or we need to change your treatment, we will call you to review the results.  Testing/Procedures: Your physician has requested that you have an echocardiogram. Echocardiography is a painless test that uses sound waves to create images of your heart. It provides your doctor with information about the size and shape of your heart and how well your heart's chambers and valves are working. This procedure takes approximately one hour. There are no restrictions for this procedure.  We will call you to schedule this appointment  Follow-Up: At [facility], you and your health needs are our priority.  As part of our continuing mission to provide you with exceptional heart care, we have created designated Provider Care Teams.  These Care Teams include your primary Cardiologist (physician) and Advanced Practice Providers (APPs -  Physician Assistants and Nurse Practitioners) who all work together to provide you with the care you need, when you need it.  Your next appointment:   March 22,2021 at 8:00  The format for your next appointment:   In Person  Provider:  Dr Harrington Challenger   Other Instructions

## 2019-11-23 NOTE — Progress Notes (Signed)
Office Visit Note   Patient: Kari [name]           Date of Birth: [birth year]           MRN: [id number] Visit Date: 11/23/2019              Requested by: Rutherford Guys, MD [address] [place],  [place] [address] PCP: Rutherford Guys, MD  Chief Complaint  Patient presents with  . Right Leg - Routine Post Op    08/26/19 STSG BLE  . Left Leg - Routine Post Op      HPI: Patient presents in follow-up today for her chronic bilateral lower extremity venous ulcers.  She has been doing much better with serial compression wraps with the Iodosorb dressings.  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  Follow-up at the end of the week.  Hopefully soon can get into some compression wraps  Follow-Up Instructions: No follow-ups on file.   Ortho Exam  Patient is alert, oriented, no adenopathy, well-dressed, normal affect, normal respiratory effort. There is epithelialization to the wound edges there is no active drainage and swelling is decreasing no surrounding erythema odor or sign of infection     Imaging: No results found. No images are attached to the encounter.  Labs: Lab Results  Component Value Date   HGBA1C 5.8 (H) 08/20/2019   HGBA1C 5.6 12/17/2016   HGBA1C 5.5 03/12/2014   REPTSTATUS 08/28/2019 FINAL 08/23/2019   GRAMSTAIN  08/23/2019    MODERATE WBC PRESENT, PREDOMINANTLY PMN FEW GRAM POSITIVE COCCI IN PAIRS IN CLUSTERS RARE GRAM NEGATIVE RODS    CULT  08/23/2019    FEW PSEUDOMONAS AERUGINOSA FEW PROTEUS MIRABILIS FEW STAPHYLOCOCCUS SPECIES (COAGULASE NEGATIVE) NO ANAEROBES ISOLATED Performed at [facility], [place] [address] [place], [place] [address]    LABORGA PSEUDOMONAS AERUGINOSA 08/23/2019   LABORGA PROTEUS MIRABILIS 08/23/2019      Lab Results  Component Value Date   ALBUMIN 3.7 (L) 09/28/2019   ALBUMIN 1.9 (L) 08/28/2019   ALBUMIN 2.9 (L) 08/19/2019   PREALBUMIN 19.7 08/31/2019    Lab Results  Component Value Date   MG 2.4 01/05/2019   MG 2.3 01/02/2019   MG 2.0 12/27/2018   Lab Results  Component Value Date   VD25OH 26.7 (L) 08/22/2019    Lab Results  Component Value Date   PREALBUMIN 19.7 08/31/2019   CBC EXTENDED Latest Ref Rng & Units 08/31/2019 08/29/2019 08/28/2019  WBC 4.0 - 10.5 K/uL 5.5 7.6 10.8(H)  RBC 3.87 - 5.11 MIL/uL 2.85(L) 2.73(L) 2.58(L)  HGB 12.0 - 15.0 g/dL 8.2(L) 7.7(L) 7.4(L)  HCT 36.0 - 46.0 % 27.3(L) 26.9(L) 24.9(L)  PLT 150 - 400 K/uL 237 244 256  NEUTROABS 1.7 - 7.7 K/uL 3.5 - 7.5  LYMPHSABS 0.7 - 4.0 K/uL 1.2 - 2.1     Body mass index is 46.52 kg/m.  Orders:  No orders of the defined types were placed in this encounter.  No orders of the defined types were placed in this encounter.    Procedures: No procedures performed  Clinical Data: No additional findings.  ROS:  All other systems negative, except as noted in the HPI. Review of Systems  Objective: Vital Signs: Ht 5\' 7"  (1.702 m)   Wt 297 lb (134.7 kg)  LMP 11/21/2011   BMI 46.52 kg/m   Specialty Comments:  No specialty comments available.  PMFS History: Patient Active Problem List   Diagnosis Date Noted  . Multiple open wounds of lower leg   . Moderate protein-calorie malnutrition ([place])   . Non-healing ulcer ([place]) 08/19/2019  . Postoperative pain   . Labile blood pressure   . Nonischemic cardiomyopathy ([place])   . Physical debility 07/20/2019  . Idiopathic chronic venous hypertension of lower extremity with ulcer, bilateral ([place]) 07/15/2019  . H/O skin graft 05/13/2019  . CKD (chronic kidney disease), stage III 01/13/2019  . Infected wound 12/26/2018  . Idiopathic chronic venous hypertension of both lower extremities with ulcer and inflammation ([place])   . History of fall 05/10/2018  .  Hyperkalemia 02/21/2018  . Hypotension 02/21/2018  . Sepsis ([place]) 02/21/2018  . Encounter for central line placement   . Mitral regurgitation 10/30/2017  . Acute kidney injury ([place]) 07/21/2017  . Generalized weakness 07/20/2017  . Shock circulatory ([place])   . Acute respiratory failure with hypoxia ([place])   . Pulmonary edema   . Cardiac arrest ([place]) 06/26/2017  . Ventricular fibrillation ([place])   . Acute on chronic diastolic heart failure ([place])   . Abnormal ECG   . Cellulitis 04/02/2017  . AKI (acute kidney injury) ([place]) 12/17/2016  . Cellulitis and abscess of right lower extremity 12/17/2016  . Syncope and collapse 12/17/2016  . Peripheral edema 12/17/2016  . Hypokalemia 12/17/2016  . Anemia of chronic disease 12/17/2016  . Acute diastolic CHF (congestive heart failure) ([place]) 03/14/2014  . CHF (congestive heart failure) ([place]) 03/12/2014  . Sarcoidosis ([place]) 03/12/2014  . Severe sepsis ([place]) 10/17/2013  . ARF (acute renal failure) ([place]) 10/08/2013  . Cellulitis of multiple sites of lower extremity 10/08/2013  . Morbid obesity ([place]) 10/08/2013  . Volume depletion 10/08/2013  . Venous (peripheral) insufficiency 10/28/2012  . Mediastinal lymphadenopathy 10/16/2012  . Pulmonary nodules 10/16/2012  . 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 Chest pain 09/02/2012  . Asthma   . Hypertension   . Depression   . Venous stasis ulcers ([place]) 07/29/2012  . Varicose veins of lower extremities with ulcer ([place]) 07/08/2012  . Venous ulcer of leg ([place]) 07/08/2012   Past Medical History:  Diagnosis Date  . Arthritis   . Asthma   . CHF (congestive heart failure) ([place]) 03/12/2014   a. EF 40-45% by echo in 06/2017 with normal cors by cath. ICD placed following VT arrest  . Depression   . Headache(784.0)   . History of blood transfusion   . Hyperlipidemia   . Hypertension   . Lung nodules   . Myocardial infarction ([place]) 06/26/2014  . Renal  disorder    followed by [facility]  . Sarcoidosis   . Ulcer    recurring, from chronic venous insufficiency    Family History  Problem Relation Age of Onset  . Heart failure Mother   . Diabetes Mellitus II Mother   . Hypertension Mother   . Cancer Mother        unknown type  . Heart disease Father   . Stroke Father   . Diabetes Mellitus II Father     Past Surgical History:  Procedure Laterality Date  . cataract surgery Left 07-2013  . I&D EXTREMITY Bilateral 12/31/2018   Procedure: DEBRIDEMENT BILATERAL LEGS, APPLY VAC X 2;  Surgeon: [name], MD;  Location: [place];  Service: Orthopedics;  Laterality: Bilateral;  .  I&D EXTREMITY Bilateral 01/02/2019   Procedure: REPEAT DEBRIDEMENT BILATERAL LEGS, APPLY VAC X 2;  Surgeon: [name], MD;  Location: [place];  Service: Orthopedics;  Laterality: Bilateral;  . I&D EXTREMITY Bilateral 07/15/2019   Procedure: IRRIGATION AND DEBRIDEMENT VENOUS STASIS INSUFFICIENCY ULCERATIONS BILATERAL LOWER EXTREMITIES;  Surgeon: [name], MD;  Location: [place];  Service: Orthopedics;  Laterality: Bilateral;  . I&D EXTREMITY Bilateral 08/23/2019   Procedure: DEBRIDEMENT EXTREMITY;  Surgeon: [name], MD;  Location: [place];  Service: Orthopedics;  Laterality: Bilateral;  . ICD IMPLANT N/A 07/05/2017   Procedure: ICD Implant;  Surgeon: Constance Haw, MD;  Location: [place] [facility];  Service: Cardiovascular;  Laterality: N/A;  . LEFT HEART CATH AND CORONARY ANGIOGRAPHY N/A 07/03/2017   Procedure: Left Heart Cath and Coronary Angiography;  Surgeon: Belva Crome, MD;  Location: [place] [facility];  Service: Cardiovascular;  Laterality: N/A;  . SKIN SPLIT GRAFT Bilateral 01/07/2019   Procedure: SPLIT THICKNESS SKIN GRAFT BILATERAL LEGS, APPLY VAC;  Surgeon: [name], MD;  Location: [place];  Service: Orthopedics;  Laterality: Bilateral;  . SKIN SPLIT GRAFT Bilateral 07/17/2019   Procedure: REPEAT IRRIGATION AND DEBRIDEMENT BILATERAL  LOWER EXTREMITIES, SKIN GRAFT;  Surgeon: [name], MD;  Location: [place];  Service: Orthopedics;  Laterality: Bilateral;  . SKIN SPLIT GRAFT Bilateral 08/26/2019   Procedure: SKIN GRAFT SPLIT THICKNESS BILATERAL LEGS, APPLY WOUND VAC;  Surgeon: [name], MD;  Location: [place];  Service: Orthopedics;  Laterality: Bilateral;   Social History   Occupational History  . Not on file  Tobacco Use  . Smoking status: Never Smoker  . Smokeless tobacco: Never Used  Substance and Sexual Activity  . Alcohol use: No  . Drug use: No  . Sexual activity: Not Currently

## 2019-11-24 NOTE — Telephone Encounter (Signed)
Terri Piedra was called and given verbal orders for patient's physical therapy orders. LVM

## 2019-11-26 ENCOUNTER — Ambulatory Visit (INDEPENDENT_AMBULATORY_CARE_PROVIDER_SITE_OTHER): Payer: Medicare Other | Admitting: Orthopedic Surgery

## 2019-11-26 ENCOUNTER — Encounter: Payer: Self-pay | Admitting: Orthopedic Surgery

## 2019-11-26 ENCOUNTER — Other Ambulatory Visit: Payer: Self-pay

## 2019-11-26 VITALS — Ht 67.0 in | Wt 297.0 lb

## 2019-11-26 DIAGNOSIS — L97929 Non-pressure chronic ulcer of unspecified part of left lower leg with unspecified severity: Secondary | ICD-10-CM | POA: Diagnosis not present

## 2019-11-26 DIAGNOSIS — L97919 Non-pressure chronic ulcer of unspecified part of right lower leg with unspecified severity: Secondary | ICD-10-CM

## 2019-11-26 DIAGNOSIS — I87333 Chronic venous hypertension (idiopathic) with ulcer and inflammation of bilateral lower extremity: Secondary | ICD-10-CM

## 2019-11-26 NOTE — Progress Notes (Signed)
Office Visit Note   Patient: Kari [name]           Date of Birth: 1961-02-25           MRN: [id number] Visit Date: 11/26/2019              Requested by: Rutherford Guys, MD [address] [place],  [place] [address] PCP: Rutherford Guys, MD  Chief Complaint  Patient presents with  . Right Leg - Dressing Change  . Left Leg - Dressing Change      HPI: Patient presents today for her twice weekly Dynaflex wrappings status post bilateral lower extremity skin grafts.  She is doing very well and states she has seen some significant improvement in her legs she is inquiring about some Velcro wraps as she cannot tolerate compression stockings when the time is appropriate  Assessment & Plan: Visit Diagnoses: No diagnosis found.  Plan: Follow-up next week  Follow-Up Instructions: No follow-ups on file.   Ortho Exam  Patient is alert, oriented, no adenopathy, well-dressed, normal affect, normal respiratory effort. Bilateral lower extremities noted decreased swelling.  She is having less drainage than previously healthy granulation tissue no cellulitis or foul odor  Imaging: No results found. No images are attached to the encounter.  Labs: Lab Results  Component Value Date   HGBA1C 5.8 (H) 08/20/2019   HGBA1C 5.6 12/17/2016   HGBA1C 5.5 03/12/2014   REPTSTATUS 08/28/2019 FINAL 08/23/2019   GRAMSTAIN  08/23/2019    MODERATE WBC PRESENT, PREDOMINANTLY PMN FEW GRAM POSITIVE COCCI IN PAIRS IN CLUSTERS RARE GRAM NEGATIVE RODS    CULT  08/23/2019    FEW PSEUDOMONAS AERUGINOSA FEW PROTEUS MIRABILIS FEW STAPHYLOCOCCUS SPECIES (COAGULASE NEGATIVE) NO ANAEROBES ISOLATED Performed at [facility], [place] [address] [place], [place] [address]    LABORGA PSEUDOMONAS AERUGINOSA 08/23/2019   LABORGA PROTEUS MIRABILIS 08/23/2019     Lab Results  Component Value Date   ALBUMIN 3.7 (L) 09/28/2019   ALBUMIN 1.9 (L) 08/28/2019   ALBUMIN 2.9 (L) 08/19/2019   PREALBUMIN 19.7  08/31/2019    Lab Results  Component Value Date   MG 2.4 01/05/2019   MG 2.3 01/02/2019   MG 2.0 12/27/2018   Lab Results  Component Value Date   VD25OH 26.7 (L) 08/22/2019    Lab Results  Component Value Date   PREALBUMIN 19.7 08/31/2019   CBC EXTENDED Latest Ref Rng & Units 08/31/2019 08/29/2019 08/28/2019  WBC 4.0 - 10.5 K/uL 5.5 7.6 10.8(H)  RBC 3.87 - 5.11 MIL/uL 2.85(L) 2.73(L) 2.58(L)  HGB 12.0 - 15.0 g/dL 8.2(L) 7.7(L) 7.4(L)  HCT 36.0 - 46.0 % 27.3(L) 26.9(L) 24.9(L)  PLT 150 - 400 K/uL 237 244 256  NEUTROABS 1.7 - 7.7 K/uL 3.5 - 7.5  LYMPHSABS 0.7 - 4.0 K/uL 1.2 - 2.1     Body mass index is 46.52 kg/m.  Orders:  No orders of the defined types were placed in this encounter.  No orders of the defined types were placed in this encounter.    Procedures: No procedures performed  Clinical Data: No additional findings.  ROS:  All other systems negative, except as noted in the HPI. Review of Systems  Objective: Vital Signs: Ht 5\' 7"  (1.702 m)   Wt 297 lb (134.7 kg)   LMP 11/21/2011   BMI 46.52 kg/m   Specialty Comments:  No specialty comments available.  PMFS History: Patient Active Problem List   Diagnosis Date Noted  . Multiple open  wounds of lower leg   . Moderate protein-calorie malnutrition ([place])   . Non-healing ulcer ([place]) 08/19/2019  . Postoperative pain   . Labile blood pressure   . Nonischemic cardiomyopathy ([place])   . Physical debility 07/20/2019  . Idiopathic chronic venous hypertension of lower extremity with ulcer, bilateral ([place]) 07/15/2019  . H/O skin graft 05/13/2019  . CKD (chronic kidney disease), stage III 01/13/2019  . Infected wound 12/26/2018  . Idiopathic chronic venous hypertension of both lower extremities with ulcer and inflammation ([place])   . History of fall 05/10/2018  . Hyperkalemia 02/21/2018  . Hypotension 02/21/2018  . Sepsis ([place]) 02/21/2018  . Encounter for central line placement   . Mitral regurgitation  10/30/2017  . Acute kidney injury ([place]) 07/21/2017  . Generalized weakness 07/20/2017  . Shock circulatory ([place])   . Acute respiratory failure with hypoxia ([place])   . Pulmonary edema   . Cardiac arrest ([place]) 06/26/2017  . Ventricular fibrillation ([place])   . Acute on chronic diastolic heart failure ([place])   . Abnormal ECG   . Cellulitis 04/02/2017  . AKI (acute kidney injury) ([place]) 12/17/2016  . Cellulitis and abscess of right lower extremity 12/17/2016  . Syncope and collapse 12/17/2016  . Peripheral edema 12/17/2016  . Hypokalemia 12/17/2016  . Anemia of chronic disease 12/17/2016  . Acute diastolic CHF (congestive heart failure) ([place]) 03/14/2014  . CHF (congestive heart failure) ([place]) 03/12/2014  . Sarcoidosis ([place]) 03/12/2014  . Severe sepsis ([place]) 10/17/2013  . ARF (acute renal failure) ([place]) 10/08/2013  . Cellulitis of multiple sites of lower extremity 10/08/2013  . Morbid obesity ([place]) 10/08/2013  . Volume depletion 10/08/2013  . Venous (peripheral) insufficiency 10/28/2012  . Mediastinal lymphadenopathy 10/16/2012  . Pulmonary nodules 10/16/2012  . 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 Chest pain 09/02/2012  . Asthma   . Hypertension   . Depression   . Venous stasis ulcers ([place]) 07/29/2012  . Varicose veins of lower extremities with ulcer ([place]) 07/08/2012  . Venous ulcer of leg ([place]) 07/08/2012   Past Medical History:  Diagnosis Date  . Arthritis   . Asthma   . CHF (congestive heart failure) ([place]) 03/12/2014   a. EF 40-45% by echo in 06/2017 with normal cors by cath. ICD placed following VT arrest  . Depression   . Headache(784.0)   . History of blood transfusion   . Hyperlipidemia   . Hypertension   . Lung nodules   . Myocardial infarction ([place]) 06/26/2014  . Renal disorder    followed by [facility]  . Sarcoidosis   . Ulcer    recurring, from chronic venous insufficiency    Family History  Problem  Relation Age of Onset  . Heart failure Mother   . Diabetes Mellitus II Mother   . Hypertension Mother   . Cancer Mother        unknown type  . Heart disease Father   . Stroke Father   . Diabetes Mellitus II Father     Past Surgical History:  Procedure Laterality Date  . cataract surgery Left 07-2013  . I&D EXTREMITY Bilateral 12/31/2018   Procedure: DEBRIDEMENT BILATERAL LEGS, APPLY VAC X 2;  Surgeon: [name], MD;  Location: [facility];  Service: Orthopedics;  Laterality: Bilateral;  . I&D EXTREMITY Bilateral 01/02/2019   Procedure: REPEAT DEBRIDEMENT BILATERAL LEGS, APPLY VAC X 2;  Surgeon: [name], MD;  Location: [facility];  Service: Orthopedics;  Laterality: Bilateral;  .  I&D EXTREMITY Bilateral 07/15/2019   Procedure: IRRIGATION AND DEBRIDEMENT VENOUS STASIS INSUFFICIENCY ULCERATIONS BILATERAL LOWER EXTREMITIES;  Surgeon: [name], MD;  Location: [facility];  Service: Orthopedics;  Laterality: Bilateral;  . I&D EXTREMITY Bilateral 08/23/2019   Procedure: DEBRIDEMENT EXTREMITY;  Surgeon: [name], MD;  Location: [facility];  Service: Orthopedics;  Laterality: Bilateral;  . ICD IMPLANT N/A 07/05/2017   Procedure: ICD Implant;  Surgeon: Constance Haw, MD;  Location: [facility];  Service: Cardiovascular;  Laterality: N/A;  . LEFT HEART CATH AND CORONARY ANGIOGRAPHY N/A 07/03/2017   Procedure: Left Heart Cath and Coronary Angiography;  Surgeon: Belva Crome, MD;  Location: [facility];  Service: Cardiovascular;  Laterality: N/A;  . SKIN SPLIT GRAFT Bilateral 01/07/2019   Procedure: SPLIT THICKNESS SKIN GRAFT BILATERAL LEGS, APPLY VAC;  Surgeon: [name], MD;  Location: [facility];  Service: Orthopedics;  Laterality: Bilateral;  . SKIN SPLIT GRAFT Bilateral 07/17/2019   Procedure: REPEAT IRRIGATION AND DEBRIDEMENT BILATERAL LOWER EXTREMITIES, SKIN GRAFT;  Surgeon: [name], MD;  Location: [facility];  Service: Orthopedics;  Laterality: Bilateral;  . SKIN SPLIT GRAFT  Bilateral 08/26/2019   Procedure: SKIN GRAFT SPLIT THICKNESS BILATERAL LEGS, APPLY WOUND VAC;  Surgeon: [name], MD;  Location: [facility];  Service: Orthopedics;  Laterality: Bilateral;   Social History   Occupational History  . Not on file  Tobacco Use  . Smoking status: Never Smoker  . Smokeless tobacco: Never Used  Substance and Sexual Activity  . Alcohol use: No  . Drug use: No  . Sexual activity: Not Currently

## 2019-11-27 DIAGNOSIS — T8131XD Disruption of external operation (surgical) wound, not elsewhere classified, subsequent encounter: Secondary | ICD-10-CM | POA: Diagnosis not present

## 2019-11-27 DIAGNOSIS — T8149XD Infection following a procedure, other surgical site, subsequent encounter: Secondary | ICD-10-CM | POA: Diagnosis not present

## 2019-11-27 DIAGNOSIS — I96 Gangrene, not elsewhere classified: Secondary | ICD-10-CM | POA: Diagnosis not present

## 2019-11-27 DIAGNOSIS — I872 Venous insufficiency (chronic) (peripheral): Secondary | ICD-10-CM | POA: Diagnosis not present

## 2019-11-27 DIAGNOSIS — B965 Pseudomonas (aeruginosa) (mallei) (pseudomallei) as the cause of diseases classified elsewhere: Secondary | ICD-10-CM | POA: Diagnosis not present

## 2019-11-27 DIAGNOSIS — B964 Proteus (mirabilis) (morganii) as the cause of diseases classified elsewhere: Secondary | ICD-10-CM | POA: Diagnosis not present

## 2019-11-28 DIAGNOSIS — I96 Gangrene, not elsewhere classified: Secondary | ICD-10-CM | POA: Diagnosis not present

## 2019-11-28 DIAGNOSIS — B965 Pseudomonas (aeruginosa) (mallei) (pseudomallei) as the cause of diseases classified elsewhere: Secondary | ICD-10-CM | POA: Diagnosis not present

## 2019-11-28 DIAGNOSIS — T8131XD Disruption of external operation (surgical) wound, not elsewhere classified, subsequent encounter: Secondary | ICD-10-CM | POA: Diagnosis not present

## 2019-11-28 DIAGNOSIS — B964 Proteus (mirabilis) (morganii) as the cause of diseases classified elsewhere: Secondary | ICD-10-CM | POA: Diagnosis not present

## 2019-11-28 DIAGNOSIS — I872 Venous insufficiency (chronic) (peripheral): Secondary | ICD-10-CM | POA: Diagnosis not present

## 2019-11-28 DIAGNOSIS — T8149XD Infection following a procedure, other surgical site, subsequent encounter: Secondary | ICD-10-CM | POA: Diagnosis not present

## 2019-11-30 ENCOUNTER — Encounter: Payer: Self-pay | Admitting: Physician Assistant

## 2019-11-30 ENCOUNTER — Other Ambulatory Visit: Payer: Self-pay

## 2019-11-30 ENCOUNTER — Ambulatory Visit (INDEPENDENT_AMBULATORY_CARE_PROVIDER_SITE_OTHER): Payer: Medicare Other | Admitting: Physician Assistant

## 2019-11-30 VITALS — Ht 67.0 in | Wt 297.0 lb

## 2019-11-30 DIAGNOSIS — L97929 Non-pressure chronic ulcer of unspecified part of left lower leg with unspecified severity: Secondary | ICD-10-CM

## 2019-11-30 DIAGNOSIS — L97919 Non-pressure chronic ulcer of unspecified part of right lower leg with unspecified severity: Secondary | ICD-10-CM | POA: Diagnosis not present

## 2019-11-30 DIAGNOSIS — I87333 Chronic venous hypertension (idiopathic) with ulcer and inflammation of bilateral lower extremity: Secondary | ICD-10-CM

## 2019-11-30 NOTE — Progress Notes (Signed)
Office Visit Note   Patient: Kari [name]           Date of Birth: 1961/02/22           MRN: [id number] Visit Date: 11/30/2019              Requested by: Rutherford Guys, MD [address] [place],  [place] [address] PCP: Rutherford Guys, MD  Chief Complaint  Patient presents with  . Left Leg - Routine Post Op    08/26/2019 BiL SG  . Right Leg - Routine Post Op      HPI: Patient is here today for follow-up on her bilateral lower extremity skin graft.  She has been doing Dynaflex wraps 2 times a week she is also needing to be measured for Circaid wraps.  She feels the swelling in her legs is gotten much better  Assessment & Plan: Visit Diagnoses: No diagnosis found.  Plan: She was measured for her wraps today we will continue to do Dynaflex until those are available  Follow-Up Instructions: No follow-ups on file.   Ortho Exam  Patient is alert, oriented, no adenopathy, well-dressed, normal affect, normal respiratory effort. Bilateral lower extremities minimal drainage continued ingrowth of skin.  Good granular tissue  Imaging: No results found. No images are attached to the encounter.  Labs: Lab Results  Component Value Date   HGBA1C 5.8 (H) 08/20/2019   HGBA1C 5.6 12/17/2016   HGBA1C 5.5 03/12/2014   REPTSTATUS 08/28/2019 FINAL 08/23/2019   GRAMSTAIN  08/23/2019    MODERATE WBC PRESENT, PREDOMINANTLY PMN FEW GRAM POSITIVE COCCI IN PAIRS IN CLUSTERS RARE GRAM NEGATIVE RODS    CULT  08/23/2019    FEW PSEUDOMONAS AERUGINOSA FEW PROTEUS MIRABILIS FEW STAPHYLOCOCCUS SPECIES (COAGULASE NEGATIVE) NO ANAEROBES ISOLATED Performed at [facility], [place] [address], [place], [place] [address]    LABORGA PSEUDOMONAS AERUGINOSA 08/23/2019   LABORGA PROTEUS MIRABILIS 08/23/2019     Lab Results  Component Value Date   ALBUMIN 3.7 (L) 09/28/2019   ALBUMIN 1.9 (L) 08/28/2019   ALBUMIN 2.9 (L) 08/19/2019   PREALBUMIN 19.7 08/31/2019    Lab Results   Component Value Date   MG 2.4 01/05/2019   MG 2.3 01/02/2019   MG 2.0 12/27/2018   Lab Results  Component Value Date   VD25OH 26.7 (L) 08/22/2019    Lab Results  Component Value Date   PREALBUMIN 19.7 08/31/2019   CBC EXTENDED Latest Ref Rng & Units 08/31/2019 08/29/2019 08/28/2019  WBC 4.0 - 10.5 K/uL 5.5 7.6 10.8(H)  RBC 3.87 - 5.11 MIL/uL 2.85(L) 2.73(L) 2.58(L)  HGB 12.0 - 15.0 g/dL 8.2(L) 7.7(L) 7.4(L)  HCT 36.0 - 46.0 % 27.3(L) 26.9(L) 24.9(L)  PLT 150 - 400 K/uL 237 244 256  NEUTROABS 1.7 - 7.7 K/uL 3.5 - 7.5  LYMPHSABS 0.7 - 4.0 K/uL 1.2 - 2.1     Body mass index is 46.52 kg/m.  Orders:  No orders of the defined types were placed in this encounter.  No orders of the defined types were placed in this encounter.    Procedures: No procedures performed  Clinical Data: No additional findings.  ROS:  All other systems negative, except as noted in the HPI. Review of Systems  Objective: Vital Signs: Ht 5\' 7"  (1.702 m)   Wt 297 lb (134.7 kg)   LMP 11/21/2011   BMI 46.52 kg/m   Specialty Comments:  No specialty comments available.  PMFS History: Patient Active Problem List  Diagnosis Date Noted  . Multiple open wounds of lower leg   . Moderate protein-calorie malnutrition ([place])   . Non-healing ulcer ([place]) 08/19/2019  . Postoperative pain   . Labile blood pressure   . Nonischemic cardiomyopathy ([place])   . Physical debility 07/20/2019  . Idiopathic chronic venous hypertension of lower extremity with ulcer, bilateral ([place]) 07/15/2019  . H/O skin graft 05/13/2019  . CKD (chronic kidney disease), stage III 01/13/2019  . Infected wound 12/26/2018  . Idiopathic chronic venous hypertension of both lower extremities with ulcer and inflammation ([place])   . History of fall 05/10/2018  . Hyperkalemia 02/21/2018  . Hypotension 02/21/2018  . Sepsis ([place]) 02/21/2018  . Encounter for central line placement   . Mitral regurgitation 10/30/2017  . Acute kidney injury  ([place]) 07/21/2017  . Generalized weakness 07/20/2017  . Shock circulatory ([place])   . Acute respiratory failure with hypoxia ([place])   . Pulmonary edema   . Cardiac arrest ([place]) 06/26/2017  . Ventricular fibrillation ([place])   . Acute on chronic diastolic heart failure ([place])   . Abnormal ECG   . Cellulitis 04/02/2017  . AKI (acute kidney injury) ([place]) 12/17/2016  . Cellulitis and abscess of right lower extremity 12/17/2016  . Syncope and collapse 12/17/2016  . Peripheral edema 12/17/2016  . Hypokalemia 12/17/2016  . Anemia of chronic disease 12/17/2016  . Acute diastolic CHF (congestive heart failure) ([place]) 03/14/2014  . CHF (congestive heart failure) ([place]) 03/12/2014  . Sarcoidosis ([place]) 03/12/2014  . Severe sepsis ([place]) 10/17/2013  . ARF (acute renal failure) ([place]) 10/08/2013  . Cellulitis of multiple sites of lower extremity 10/08/2013  . Morbid obesity ([place]) 10/08/2013  . Volume depletion 10/08/2013  . Venous (peripheral) insufficiency 10/28/2012  . Mediastinal lymphadenopathy 10/16/2012  . Pulmonary nodules 10/16/2012  . 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 Chest pain 09/02/2012  . Asthma   . Hypertension   . Depression   . Venous stasis ulcers ([place]) 07/29/2012  . Varicose veins of lower extremities with ulcer ([place]) 07/08/2012  . Venous ulcer of leg ([place]) 07/08/2012   Past Medical History:  Diagnosis Date  . Arthritis   . Asthma   . CHF (congestive heart failure) ([place]) 03/12/2014   a. EF 40-45% by echo in 06/2017 with normal cors by cath. ICD placed following VT arrest  . Depression   . Headache(784.0)   . History of blood transfusion   . Hyperlipidemia   . Hypertension   . Lung nodules   . Myocardial infarction ([place]) 06/26/2014  . Renal disorder    followed by [facility]  . Sarcoidosis   . Ulcer    recurring, from chronic venous insufficiency    Family History  Problem Relation Age of Onset  . Heart  failure Mother   . Diabetes Mellitus II Mother   . Hypertension Mother   . Cancer Mother        unknown type  . Heart disease Father   . Stroke Father   . Diabetes Mellitus II Father     Past Surgical History:  Procedure Laterality Date  . cataract surgery Left 07-2013  . I&D EXTREMITY Bilateral 12/31/2018   Procedure: DEBRIDEMENT BILATERAL LEGS, APPLY VAC X 2;  Surgeon: [name], MD;  Location: [facility];  Service: Orthopedics;  Laterality: Bilateral;  . I&D EXTREMITY Bilateral 01/02/2019   Procedure: REPEAT DEBRIDEMENT BILATERAL LEGS, APPLY VAC X 2;  Surgeon: [name], MD;  Location: [facility];  Service: Orthopedics;  Laterality: Bilateral;  . I&D EXTREMITY Bilateral 07/15/2019   Procedure: IRRIGATION AND DEBRIDEMENT VENOUS STASIS INSUFFICIENCY ULCERATIONS BILATERAL LOWER EXTREMITIES;  Surgeon: [name], MD;  Location: [facility];  Service: Orthopedics;  Laterality: Bilateral;  . I&D EXTREMITY Bilateral 08/23/2019   Procedure: DEBRIDEMENT EXTREMITY;  Surgeon: [name], MD;  Location: [facility];  Service: Orthopedics;  Laterality: Bilateral;  . ICD IMPLANT N/A 07/05/2017   Procedure: ICD Implant;  Surgeon: Constance Haw, MD;  Location: [facility];  Service: Cardiovascular;  Laterality: N/A;  . LEFT HEART CATH AND CORONARY ANGIOGRAPHY N/A 07/03/2017   Procedure: Left Heart Cath and Coronary Angiography;  Surgeon: Belva Crome, MD;  Location: [facility];  Service: Cardiovascular;  Laterality: N/A;  . SKIN SPLIT GRAFT Bilateral 01/07/2019   Procedure: SPLIT THICKNESS SKIN GRAFT BILATERAL LEGS, APPLY VAC;  Surgeon: [name], MD;  Location: [facility];  Service: Orthopedics;  Laterality: Bilateral;  . SKIN SPLIT GRAFT Bilateral 07/17/2019   Procedure: REPEAT IRRIGATION AND DEBRIDEMENT BILATERAL LOWER EXTREMITIES, SKIN GRAFT;  Surgeon: [name], MD;  Location: [facility];  Service: Orthopedics;  Laterality: Bilateral;  . SKIN SPLIT GRAFT Bilateral 08/26/2019    Procedure: SKIN GRAFT SPLIT THICKNESS BILATERAL LEGS, APPLY WOUND VAC;  Surgeon: [name], MD;  Location: [facility];  Service: Orthopedics;  Laterality: Bilateral;   Social History   Occupational History  . Not on file  Tobacco Use  . Smoking status: Never Smoker  . Smokeless tobacco: Never Used  Substance and Sexual Activity  . Alcohol use: No  . Drug use: No  . Sexual activity: Not Currently

## 2019-12-01 DIAGNOSIS — I96 Gangrene, not elsewhere classified: Secondary | ICD-10-CM | POA: Diagnosis not present

## 2019-12-01 DIAGNOSIS — T8149XD Infection following a procedure, other surgical site, subsequent encounter: Secondary | ICD-10-CM | POA: Diagnosis not present

## 2019-12-01 DIAGNOSIS — I872 Venous insufficiency (chronic) (peripheral): Secondary | ICD-10-CM | POA: Diagnosis not present

## 2019-12-01 DIAGNOSIS — B965 Pseudomonas (aeruginosa) (mallei) (pseudomallei) as the cause of diseases classified elsewhere: Secondary | ICD-10-CM | POA: Diagnosis not present

## 2019-12-01 DIAGNOSIS — T8131XD Disruption of external operation (surgical) wound, not elsewhere classified, subsequent encounter: Secondary | ICD-10-CM | POA: Diagnosis not present

## 2019-12-01 DIAGNOSIS — B964 Proteus (mirabilis) (morganii) as the cause of diseases classified elsewhere: Secondary | ICD-10-CM | POA: Diagnosis not present

## 2019-12-02 ENCOUNTER — Other Ambulatory Visit: Payer: Self-pay

## 2019-12-02 ENCOUNTER — Ambulatory Visit (HOSPITAL_COMMUNITY): Payer: Medicare Other | Attending: Internal Medicine

## 2019-12-02 DIAGNOSIS — I340 Nonrheumatic mitral (valve) insufficiency: Secondary | ICD-10-CM | POA: Diagnosis not present

## 2019-12-02 DIAGNOSIS — I428 Other cardiomyopathies: Secondary | ICD-10-CM

## 2019-12-02 MED ORDER — PERFLUTREN LIPID MICROSPHERE
1.0000 mL | INTRAVENOUS | Status: AC | PRN
  Administered 2019-12-02: 2 mL via INTRAVENOUS

## 2019-12-03 ENCOUNTER — Ambulatory Visit (INDEPENDENT_AMBULATORY_CARE_PROVIDER_SITE_OTHER): Payer: Medicare Other | Admitting: Physician Assistant

## 2019-12-03 ENCOUNTER — Encounter: Payer: Self-pay | Admitting: Physician Assistant

## 2019-12-03 VITALS — Ht 67.0 in | Wt 297.0 lb

## 2019-12-03 DIAGNOSIS — I87333 Chronic venous hypertension (idiopathic) with ulcer and inflammation of bilateral lower extremity: Secondary | ICD-10-CM

## 2019-12-03 DIAGNOSIS — L97929 Non-pressure chronic ulcer of unspecified part of left lower leg with unspecified severity: Secondary | ICD-10-CM | POA: Diagnosis not present

## 2019-12-03 DIAGNOSIS — L97919 Non-pressure chronic ulcer of unspecified part of right lower leg with unspecified severity: Secondary | ICD-10-CM

## 2019-12-04 DIAGNOSIS — B965 Pseudomonas (aeruginosa) (mallei) (pseudomallei) as the cause of diseases classified elsewhere: Secondary | ICD-10-CM | POA: Diagnosis not present

## 2019-12-04 DIAGNOSIS — T8131XD Disruption of external operation (surgical) wound, not elsewhere classified, subsequent encounter: Secondary | ICD-10-CM | POA: Diagnosis not present

## 2019-12-04 DIAGNOSIS — I96 Gangrene, not elsewhere classified: Secondary | ICD-10-CM | POA: Diagnosis not present

## 2019-12-04 DIAGNOSIS — B964 Proteus (mirabilis) (morganii) as the cause of diseases classified elsewhere: Secondary | ICD-10-CM | POA: Diagnosis not present

## 2019-12-04 DIAGNOSIS — I872 Venous insufficiency (chronic) (peripheral): Secondary | ICD-10-CM | POA: Diagnosis not present

## 2019-12-04 DIAGNOSIS — T8149XD Infection following a procedure, other surgical site, subsequent encounter: Secondary | ICD-10-CM | POA: Diagnosis not present

## 2019-12-07 ENCOUNTER — Encounter: Payer: Self-pay | Admitting: Orthopedic Surgery

## 2019-12-07 ENCOUNTER — Encounter: Payer: Self-pay | Admitting: Physician Assistant

## 2019-12-07 ENCOUNTER — Other Ambulatory Visit: Payer: Self-pay

## 2019-12-07 ENCOUNTER — Ambulatory Visit (INDEPENDENT_AMBULATORY_CARE_PROVIDER_SITE_OTHER): Payer: Medicare Other | Admitting: Orthopedic Surgery

## 2019-12-07 VITALS — Ht 67.0 in | Wt 297.0 lb

## 2019-12-07 DIAGNOSIS — I87333 Chronic venous hypertension (idiopathic) with ulcer and inflammation of bilateral lower extremity: Secondary | ICD-10-CM | POA: Diagnosis not present

## 2019-12-07 DIAGNOSIS — L97929 Non-pressure chronic ulcer of unspecified part of left lower leg with unspecified severity: Secondary | ICD-10-CM

## 2019-12-07 DIAGNOSIS — L97919 Non-pressure chronic ulcer of unspecified part of right lower leg with unspecified severity: Secondary | ICD-10-CM

## 2019-12-07 NOTE — Progress Notes (Signed)
Office Visit Note   Patient: Kari [name]           Date of Birth: Apr 29, 1961           MRN: [id number] Visit Date: 12/03/2019              Requested by: Rutherford Guys, MD [address] [place],  [place] [address] PCP: Rutherford Guys, MD  Chief Complaint  Patient presents with  . Right Leg - Follow-up    08/26/2019 Repeat Bil SG/LE  . Left Leg - Follow-up      HPI: 58 y/o here for Dynaflex Dressing Change. Is contining to improve  Assessment & Plan: Visit Diagnoses: No diagnosis found.  Plan: Continue dressing changes. Will order Circaid Wraps  Follow-Up Instructions: No follow-ups on file.   Ortho Exam  Patient is alert, oriented, no adenopathy, well-dressed, normal affect, normal respiratory effort. B LE continue to heal . Mild amount of drainage. Swelling decreasing. No cellulitis or foul odor  Imaging: No results found. No images are attached to the encounter.  Labs: Lab Results  Component Value Date   HGBA1C 5.8 (H) 08/20/2019   HGBA1C 5.6 12/17/2016   HGBA1C 5.5 03/12/2014   REPTSTATUS 08/28/2019 FINAL 08/23/2019   GRAMSTAIN  08/23/2019    MODERATE WBC PRESENT, PREDOMINANTLY PMN FEW GRAM POSITIVE COCCI IN PAIRS IN CLUSTERS RARE GRAM NEGATIVE RODS    CULT  08/23/2019    FEW PSEUDOMONAS AERUGINOSA FEW PROTEUS MIRABILIS FEW STAPHYLOCOCCUS SPECIES (COAGULASE NEGATIVE) NO ANAEROBES ISOLATED Performed at [facility], [place] [address], [place], [place] [address]    LABORGA PSEUDOMONAS AERUGINOSA 08/23/2019   LABORGA PROTEUS MIRABILIS 08/23/2019     Lab Results  Component Value Date   ALBUMIN 3.7 (L) 09/28/2019   ALBUMIN 1.9 (L) 08/28/2019   ALBUMIN 2.9 (L) 08/19/2019   PREALBUMIN 19.7 08/31/2019    Lab Results  Component Value Date   MG 2.4 01/05/2019   MG 2.3 01/02/2019   MG 2.0 12/27/2018   Lab Results  Component Value Date   VD25OH 26.7 (L) 08/22/2019    Lab Results  Component Value Date   PREALBUMIN 19.7 08/31/2019    CBC EXTENDED Latest Ref Rng & Units 08/31/2019 08/29/2019 08/28/2019  WBC 4.0 - 10.5 K/uL 5.5 7.6 10.8(H)  RBC 3.87 - 5.11 MIL/uL 2.85(L) 2.73(L) 2.58(L)  HGB 12.0 - 15.0 g/dL 8.2(L) 7.7(L) 7.4(L)  HCT 36.0 - 46.0 % 27.3(L) 26.9(L) 24.9(L)  PLT 150 - 400 K/uL 237 244 256  NEUTROABS 1.7 - 7.7 K/uL 3.5 - 7.5  LYMPHSABS 0.7 - 4.0 K/uL 1.2 - 2.1     Body mass index is 46.52 kg/m.  Orders:  No orders of the defined types were placed in this encounter.  No orders of the defined types were placed in this encounter.    Procedures: No procedures performed  Clinical Data: No additional findings.  ROS:  All other systems negative, except as noted in the HPI. Review of Systems  Objective: Vital Signs: Ht 5\' 7"  (1.702 m)   Wt 297 lb (134.7 kg)   LMP 11/21/2011   BMI 46.52 kg/m   Specialty Comments:  No specialty comments available.  PMFS History: Patient Active Problem List   Diagnosis Date Noted  . Multiple open wounds of lower leg   . Moderate protein-calorie malnutrition ([place])   . Non-healing ulcer ([place]) 08/19/2019  . Postoperative pain   . Labile blood pressure   . Nonischemic cardiomyopathy ([place])   .  Physical debility 07/20/2019  . Idiopathic chronic venous hypertension of lower extremity with ulcer, bilateral ([place]) 07/15/2019  . H/O skin graft 05/13/2019  . CKD (chronic kidney disease), stage III 01/13/2019  . Infected wound 12/26/2018  . Idiopathic chronic venous hypertension of both lower extremities with ulcer and inflammation ([place])   . History of fall 05/10/2018  . Hyperkalemia 02/21/2018  . Hypotension 02/21/2018  . Sepsis ([place]) 02/21/2018  . Encounter for central line placement   . Mitral regurgitation 10/30/2017  . Acute kidney injury ([place]) 07/21/2017  . Generalized weakness 07/20/2017  . Shock circulatory ([place])   . Acute respiratory failure with hypoxia ([place])   . Pulmonary edema   . Cardiac arrest ([place]) 06/26/2017  . Ventricular fibrillation ([place])    . Acute on chronic diastolic heart failure ([place])   . Abnormal ECG   . Cellulitis 04/02/2017  . AKI (acute kidney injury) ([place]) 12/17/2016  . Cellulitis and abscess of right lower extremity 12/17/2016  . Syncope and collapse 12/17/2016  . Peripheral edema 12/17/2016  . Hypokalemia 12/17/2016  . Anemia of chronic disease 12/17/2016  . Acute diastolic CHF (congestive heart failure) ([place]) 03/14/2014  . CHF (congestive heart failure) ([place]) 03/12/2014  . Sarcoidosis ([place]) 03/12/2014  . Severe sepsis ([place]) 10/17/2013  . ARF (acute renal failure) ([place]) 10/08/2013  . Cellulitis of multiple sites of lower extremity 10/08/2013  . Morbid obesity ([place]) 10/08/2013  . Volume depletion 10/08/2013  . Venous (peripheral) insufficiency 10/28/2012  . Mediastinal lymphadenopathy 10/16/2012  . Pulmonary nodules 10/16/2012  . 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 Chest pain 09/02/2012  . Asthma   . Hypertension   . Depression   . Venous stasis ulcers ([place]) 07/29/2012  . Varicose veins of lower extremities with ulcer ([place]) 07/08/2012  . Venous ulcer of leg ([place]) 07/08/2012   Past Medical History:  Diagnosis Date  . Arthritis   . Asthma   . CHF (congestive heart failure) ([place]) 03/12/2014   a. EF 40-45% by echo in 06/2017 with normal cors by cath. ICD placed following VT arrest  . Depression   . Headache(784.0)   . History of blood transfusion   . Hyperlipidemia   . Hypertension   . Lung nodules   . Myocardial infarction ([place]) 06/26/2014  . Renal disorder    followed by [facility]  . Sarcoidosis   . Ulcer    recurring, from chronic venous insufficiency    Family History  Problem Relation Age of Onset  . Heart failure Mother   . Diabetes Mellitus II Mother   . Hypertension Mother   . Cancer Mother        unknown type  . Heart disease Father   . Stroke Father   . Diabetes Mellitus II Father     Past Surgical History:   Procedure Laterality Date  . cataract surgery Left 07-2013  . I&D EXTREMITY Bilateral 12/31/2018   Procedure: DEBRIDEMENT BILATERAL LEGS, APPLY VAC X 2;  Surgeon: [name], MD;  Location: [facility];  Service: Orthopedics;  Laterality: Bilateral;  . I&D EXTREMITY Bilateral 01/02/2019   Procedure: REPEAT DEBRIDEMENT BILATERAL LEGS, APPLY VAC X 2;  Surgeon: [name], MD;  Location: [facility];  Service: Orthopedics;  Laterality: Bilateral;  . I&D EXTREMITY Bilateral 07/15/2019   Procedure: IRRIGATION AND DEBRIDEMENT VENOUS STASIS INSUFFICIENCY ULCERATIONS BILATERAL LOWER EXTREMITIES;  Surgeon: [name], MD;  Location: [facility];  Service: Orthopedics;  Laterality: Bilateral;  .  I&D EXTREMITY Bilateral 08/23/2019   Procedure: DEBRIDEMENT EXTREMITY;  Surgeon: [name], MD;  Location: [facility];  Service: Orthopedics;  Laterality: Bilateral;  . ICD IMPLANT N/A 07/05/2017   Procedure: ICD Implant;  Surgeon: Constance Haw, MD;  Location: [facility];  Service: Cardiovascular;  Laterality: N/A;  . LEFT HEART CATH AND CORONARY ANGIOGRAPHY N/A 07/03/2017   Procedure: Left Heart Cath and Coronary Angiography;  Surgeon: Belva Crome, MD;  Location: [facility];  Service: Cardiovascular;  Laterality: N/A;  . SKIN SPLIT GRAFT Bilateral 01/07/2019   Procedure: SPLIT THICKNESS SKIN GRAFT BILATERAL LEGS, APPLY VAC;  Surgeon: [name], MD;  Location: [facility];  Service: Orthopedics;  Laterality: Bilateral;  . SKIN SPLIT GRAFT Bilateral 07/17/2019   Procedure: REPEAT IRRIGATION AND DEBRIDEMENT BILATERAL LOWER EXTREMITIES, SKIN GRAFT;  Surgeon: [name], MD;  Location: [facility];  Service: Orthopedics;  Laterality: Bilateral;  . SKIN SPLIT GRAFT Bilateral 08/26/2019   Procedure: SKIN GRAFT SPLIT THICKNESS BILATERAL LEGS, APPLY WOUND VAC;  Surgeon: [name], MD;  Location: [facility];  Service: Orthopedics;  Laterality: Bilateral;   Social History   Occupational History  . Not on file   Tobacco Use  . Smoking status: Never Smoker  . Smokeless tobacco: Never Used  Substance and Sexual Activity  . Alcohol use: No  . Drug use: No  . Sexual activity: Not Currently

## 2019-12-07 NOTE — Progress Notes (Signed)
Office Visit Note   Patient: Kari [name]           Date of Birth: [birth year]           MRN: [id number] Visit Date: 12/07/2019              Requested by: Rutherford Guys, MD [address] [place],  [place] [address] PCP: Rutherford Guys, MD  Chief Complaint  Patient presents with  . Right Leg - Follow-up    08/26/2019 BiL SG  . Left Leg - Follow-up      HPI: Pleasant 58 y/o  Hx Bilateral LE SG. She is doing well . She is taking lasix and feels better. She is having Dynaflex wraps until her Circaid Wraps arrive   Assessment & Plan: Visit Diagnoses: No diagnosis found.  Plan: Follow up 2x per week until Circaid wraps arrive  Follow-Up Instructions: No follow-ups on file.   Ortho Exam  Patient is alert, oriented, no adenopathy, well-dressed, normal affect, normal respiratory effort. Bilateral legs improvement much less drainage. No surrounding erythema.swelling in lower extremities controlled. No cellulitis  Imaging: No results found. No images are attached to the encounter.  Labs: Lab Results  Component Value Date   HGBA1C 5.8 (H) 08/20/2019   HGBA1C 5.6 12/17/2016   HGBA1C 5.5 03/12/2014   REPTSTATUS 08/28/2019 FINAL 08/23/2019   GRAMSTAIN  08/23/2019    MODERATE WBC PRESENT, PREDOMINANTLY PMN FEW GRAM POSITIVE COCCI IN PAIRS IN CLUSTERS RARE GRAM NEGATIVE RODS    CULT  08/23/2019    FEW PSEUDOMONAS AERUGINOSA FEW PROTEUS MIRABILIS FEW STAPHYLOCOCCUS SPECIES (COAGULASE NEGATIVE) NO ANAEROBES ISOLATED Performed at [facility], [place] [address], [place],  [address]    LABORGA PSEUDOMONAS AERUGINOSA 08/23/2019   LABORGA PROTEUS MIRABILIS 08/23/2019     Lab Results  Component Value Date   ALBUMIN 3.7 (L) 09/28/2019   ALBUMIN 1.9 (L) 08/28/2019   ALBUMIN 2.9 (L) 08/19/2019   PREALBUMIN 19.7 08/31/2019    Lab Results  Component Value Date   MG 2.4 01/05/2019   MG 2.3 01/02/2019   MG 2.0 12/27/2018   Lab Results  Component Value  Date   VD25OH 26.7 (L) 08/22/2019    Lab Results  Component Value Date   PREALBUMIN 19.7 08/31/2019   CBC EXTENDED Latest Ref Rng & Units 08/31/2019 08/29/2019 08/28/2019  WBC 4.0 - 10.5 K/uL 5.5 7.6 10.8(H)  RBC 3.87 - 5.11 MIL/uL 2.85(L) 2.73(L) 2.58(L)  HGB 12.0 - 15.0 g/dL 8.2(L) 7.7(L) 7.4(L)  HCT 36.0 - 46.0 % 27.3(L) 26.9(L) 24.9(L)  PLT 150 - 400 K/uL 237 244 256  NEUTROABS 1.7 - 7.7 K/uL 3.5 - 7.5  LYMPHSABS 0.7 - 4.0 K/uL 1.2 - 2.1     Body mass index is 46.52 kg/m.  Orders:  No orders of the defined types were placed in this encounter.  No orders of the defined types were placed in this encounter.    Procedures: No procedures performed  Clinical Data: No additional findings.  ROS:  All other systems negative, except as noted in the HPI. Review of Systems  Objective: Vital Signs: Ht 5\' 7"  (1.702 m)   Wt 297 lb (134.7 kg)   LMP 11/21/2011   BMI 46.52 kg/m   Specialty Comments:  No specialty comments available.  PMFS History: Patient Active Problem List   Diagnosis Date Noted  . Multiple open wounds of lower leg   . Moderate protein-calorie malnutrition ([place])   . Non-healing ulcer ([place])  08/19/2019  . Postoperative pain   . Labile blood pressure   . Nonischemic cardiomyopathy ([place])   . Physical debility 07/20/2019  . Idiopathic chronic venous hypertension of lower extremity with ulcer, bilateral ([place]) 07/15/2019  . H/O skin graft 05/13/2019  . CKD (chronic kidney disease), stage III 01/13/2019  . Infected wound 12/26/2018  . Idiopathic chronic venous hypertension of both lower extremities with ulcer and inflammation ([place])   . History of fall 05/10/2018  . Hyperkalemia 02/21/2018  . Hypotension 02/21/2018  . Sepsis ([place]) 02/21/2018  . Encounter for central line placement   . Mitral regurgitation 10/30/2017  . Acute kidney injury ([place]) 07/21/2017  . Generalized weakness 07/20/2017  . Shock circulatory ([place])   . Acute respiratory failure with  hypoxia ([place])   . Pulmonary edema   . Cardiac arrest ([place]) 06/26/2017  . Ventricular fibrillation ([place])   . Acute on chronic diastolic heart failure ([place])   . Abnormal ECG   . Cellulitis 04/02/2017  . AKI (acute kidney injury) ([place]) 12/17/2016  . Cellulitis and abscess of right lower extremity 12/17/2016  . Syncope and collapse 12/17/2016  . Peripheral edema 12/17/2016  . Hypokalemia 12/17/2016  . Anemia of chronic disease 12/17/2016  . Acute diastolic CHF (congestive heart failure) ([place]) 03/14/2014  . CHF (congestive heart failure) ([place]) 03/12/2014  . Sarcoidosis ([place]) 03/12/2014  . Severe sepsis ([place]) 10/17/2013  . ARF (acute renal failure) ([place]) 10/08/2013  . Cellulitis of multiple sites of lower extremity 10/08/2013  . Morbid obesity ([place]) 10/08/2013  . Volume depletion 10/08/2013  . Venous (peripheral) insufficiency 10/28/2012  . Mediastinal lymphadenopathy 10/16/2012  . Pulmonary nodules 10/16/2012  . 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 Chest pain 09/02/2012  . Asthma   . Hypertension   . Depression   . Venous stasis ulcers ([place]) 07/29/2012  . Varicose veins of lower extremities with ulcer ([place]) 07/08/2012  . Venous ulcer of leg ([place]) 07/08/2012   Past Medical History:  Diagnosis Date  . Arthritis   . Asthma   . CHF (congestive heart failure) ([place]) 03/12/2014   a. EF 40-45% by echo in 06/2017 with normal cors by cath. ICD placed following VT arrest  . Depression   . Headache(784.0)   . History of blood transfusion   . Hyperlipidemia   . Hypertension   . Lung nodules   . Myocardial infarction ([place]) 06/26/2014  . Renal disorder    followed by [facility]  . Sarcoidosis   . Ulcer    recurring, from chronic venous insufficiency    Family History  Problem Relation Age of Onset  . Heart failure Mother   . Diabetes Mellitus II Mother   . Hypertension Mother   . Cancer Mother        unknown type  .  Heart disease Father   . Stroke Father   . Diabetes Mellitus II Father     Past Surgical History:  Procedure Laterality Date  . cataract surgery Left 07-2013  . I&D EXTREMITY Bilateral 12/31/2018   Procedure: DEBRIDEMENT BILATERAL LEGS, APPLY VAC X 2;  Surgeon: [name], MD;  Location: [facility];  Service: Orthopedics;  Laterality: Bilateral;  . I&D EXTREMITY Bilateral 01/02/2019   Procedure: REPEAT DEBRIDEMENT BILATERAL LEGS, APPLY VAC X 2;  Surgeon: [name], MD;  Location: [facility];  Service: Orthopedics;  Laterality: Bilateral;  . I&D EXTREMITY Bilateral 07/15/2019   Procedure: IRRIGATION AND DEBRIDEMENT VENOUS STASIS INSUFFICIENCY ULCERATIONS BILATERAL  LOWER EXTREMITIES;  Surgeon: [name], MD;  Location: [facility];  Service: Orthopedics;  Laterality: Bilateral;  . I&D EXTREMITY Bilateral 08/23/2019   Procedure: DEBRIDEMENT EXTREMITY;  Surgeon: [name], MD;  Location: [facility];  Service: Orthopedics;  Laterality: Bilateral;  . ICD IMPLANT N/A 07/05/2017   Procedure: ICD Implant;  Surgeon: Constance Haw, MD;  Location: [facility];  Service: Cardiovascular;  Laterality: N/A;  . LEFT HEART CATH AND CORONARY ANGIOGRAPHY N/A 07/03/2017   Procedure: Left Heart Cath and Coronary Angiography;  Surgeon: Belva Crome, MD;  Location: [facility];  Service: Cardiovascular;  Laterality: N/A;  . SKIN SPLIT GRAFT Bilateral 01/07/2019   Procedure: SPLIT THICKNESS SKIN GRAFT BILATERAL LEGS, APPLY VAC;  Surgeon: [name], MD;  Location: [facility];  Service: Orthopedics;  Laterality: Bilateral;  . SKIN SPLIT GRAFT Bilateral 07/17/2019   Procedure: REPEAT IRRIGATION AND DEBRIDEMENT BILATERAL LOWER EXTREMITIES, SKIN GRAFT;  Surgeon: [name], MD;  Location: [facility];  Service: Orthopedics;  Laterality: Bilateral;  . SKIN SPLIT GRAFT Bilateral 08/26/2019   Procedure: SKIN GRAFT SPLIT THICKNESS BILATERAL LEGS, APPLY WOUND VAC;  Surgeon: [name], MD;  Location: [facility];   Service: Orthopedics;  Laterality: Bilateral;   Social History   Occupational History  . Not on file  Tobacco Use  . Smoking status: Never Smoker  . Smokeless tobacco: Never Used  Substance and Sexual Activity  . Alcohol use: No  . Drug use: No  . Sexual activity: Not Currently

## 2019-12-08 DIAGNOSIS — I96 Gangrene, not elsewhere classified: Secondary | ICD-10-CM | POA: Diagnosis not present

## 2019-12-08 DIAGNOSIS — T8131XD Disruption of external operation (surgical) wound, not elsewhere classified, subsequent encounter: Secondary | ICD-10-CM | POA: Diagnosis not present

## 2019-12-08 DIAGNOSIS — I872 Venous insufficiency (chronic) (peripheral): Secondary | ICD-10-CM | POA: Diagnosis not present

## 2019-12-08 DIAGNOSIS — T8149XD Infection following a procedure, other surgical site, subsequent encounter: Secondary | ICD-10-CM | POA: Diagnosis not present

## 2019-12-08 DIAGNOSIS — B965 Pseudomonas (aeruginosa) (mallei) (pseudomallei) as the cause of diseases classified elsewhere: Secondary | ICD-10-CM | POA: Diagnosis not present

## 2019-12-08 DIAGNOSIS — B964 Proteus (mirabilis) (morganii) as the cause of diseases classified elsewhere: Secondary | ICD-10-CM | POA: Diagnosis not present

## 2019-12-09 ENCOUNTER — Telehealth: Payer: Self-pay

## 2019-12-09 NOTE — Telephone Encounter (Signed)
Unable to speak  with patient to remind of missed remote transmission 

## 2019-12-10 ENCOUNTER — Ambulatory Visit (INDEPENDENT_AMBULATORY_CARE_PROVIDER_SITE_OTHER): Payer: Medicare Other | Admitting: Orthopedic Surgery

## 2019-12-10 ENCOUNTER — Other Ambulatory Visit: Payer: Self-pay

## 2019-12-10 ENCOUNTER — Encounter: Payer: Self-pay | Admitting: Orthopedic Surgery

## 2019-12-10 VITALS — Ht 67.0 in | Wt 297.0 lb

## 2019-12-10 DIAGNOSIS — L97929 Non-pressure chronic ulcer of unspecified part of left lower leg with unspecified severity: Secondary | ICD-10-CM | POA: Diagnosis not present

## 2019-12-10 DIAGNOSIS — L97919 Non-pressure chronic ulcer of unspecified part of right lower leg with unspecified severity: Secondary | ICD-10-CM

## 2019-12-10 DIAGNOSIS — I87333 Chronic venous hypertension (idiopathic) with ulcer and inflammation of bilateral lower extremity: Secondary | ICD-10-CM

## 2019-12-11 ENCOUNTER — Encounter: Payer: Self-pay | Admitting: Orthopedic Surgery

## 2019-12-11 DIAGNOSIS — I872 Venous insufficiency (chronic) (peripheral): Secondary | ICD-10-CM | POA: Diagnosis not present

## 2019-12-11 DIAGNOSIS — T8149XD Infection following a procedure, other surgical site, subsequent encounter: Secondary | ICD-10-CM | POA: Diagnosis not present

## 2019-12-11 DIAGNOSIS — I96 Gangrene, not elsewhere classified: Secondary | ICD-10-CM | POA: Diagnosis not present

## 2019-12-11 DIAGNOSIS — T8131XD Disruption of external operation (surgical) wound, not elsewhere classified, subsequent encounter: Secondary | ICD-10-CM | POA: Diagnosis not present

## 2019-12-11 DIAGNOSIS — B964 Proteus (mirabilis) (morganii) as the cause of diseases classified elsewhere: Secondary | ICD-10-CM | POA: Diagnosis not present

## 2019-12-11 DIAGNOSIS — B965 Pseudomonas (aeruginosa) (mallei) (pseudomallei) as the cause of diseases classified elsewhere: Secondary | ICD-10-CM | POA: Diagnosis not present

## 2019-12-11 NOTE — Progress Notes (Signed)
Office Visit Note   Patient: Kari [name]           Date of Birth: 1961/08/25           MRN: [id number] Visit Date: 12/10/2019              Requested by: Rutherford Guys, MD [address] [place],  [place] [address] PCP: Rutherford Guys, MD  Chief Complaint  Patient presents with  . Right Leg - Follow-up  . Left Leg - Follow-up      HPI: Patient is a 58 year old woman who presents follow-up for venous insufficiency both lower extremities.  She is status post skin grafting to the ulcers bilaterally.  Assessment & Plan: Visit Diagnoses:  1. Idiopathic chronic venous hypertension of both lower extremities with ulcer and inflammation (HCC)     Plan: A few more retained staples were removed.  Dry dressing and a CircAid wrap was applied patient will proceed with Dial soap cleansing daily 4 x 4 gauze CircAid nonelastic compression wrap.  Follow-Up Instructions: Return in about 3 weeks (around 12/31/2019).   Ortho Exam  Patient is alert, oriented, no adenopathy, well-dressed, normal affect, normal respiratory effort. Examination patient's wounds on both legs have some fibrinous exudative tissue there is no redness no cellulitis no signs of infection there is no drainage no cellulitis the wounds are stable.  Imaging: No results found. No images are attached to the encounter.  Labs: Lab Results  Component Value Date   HGBA1C 5.8 (H) 08/20/2019   HGBA1C 5.6 12/17/2016   HGBA1C 5.5 03/12/2014   REPTSTATUS 08/28/2019 FINAL 08/23/2019   GRAMSTAIN  08/23/2019    MODERATE WBC PRESENT, PREDOMINANTLY PMN FEW GRAM POSITIVE COCCI IN PAIRS IN CLUSTERS RARE GRAM NEGATIVE RODS    CULT  08/23/2019    FEW PSEUDOMONAS AERUGINOSA FEW PROTEUS MIRABILIS FEW STAPHYLOCOCCUS SPECIES (COAGULASE NEGATIVE) NO ANAEROBES ISOLATED Performed at [facility], [place] [address], [place], [place] [address]    LABORGA PSEUDOMONAS AERUGINOSA 08/23/2019   LABORGA PROTEUS MIRABILIS 08/23/2019      Lab Results  Component Value Date   ALBUMIN 3.7 (L) 09/28/2019   ALBUMIN 1.9 (L) 08/28/2019   ALBUMIN 2.9 (L) 08/19/2019   PREALBUMIN 19.7 08/31/2019    Lab Results  Component Value Date   MG 2.4 01/05/2019   MG 2.3 01/02/2019   MG 2.0 12/27/2018   Lab Results  Component Value Date   VD25OH 26.7 (L) 08/22/2019    Lab Results  Component Value Date   PREALBUMIN 19.7 08/31/2019   CBC EXTENDED Latest Ref Rng & Units 08/31/2019 08/29/2019 08/28/2019  WBC 4.0 - 10.5 K/uL 5.5 7.6 10.8(H)  RBC 3.87 - 5.11 MIL/uL 2.85(L) 2.73(L) 2.58(L)  HGB 12.0 - 15.0 g/dL 8.2(L) 7.7(L) 7.4(L)  HCT 36.0 - 46.0 % 27.3(L) 26.9(L) 24.9(L)  PLT 150 - 400 K/uL 237 244 256  NEUTROABS 1.7 - 7.7 K/uL 3.5 - 7.5  LYMPHSABS 0.7 - 4.0 K/uL 1.2 - 2.1     Body mass index is 46.52 kg/m.  Orders:  No orders of the defined types were placed in this encounter.  No orders of the defined types were placed in this encounter.    Procedures: No procedures performed  Clinical Data: No additional findings.  ROS:  All other systems negative, except as noted in the HPI. Review of Systems  Objective: Vital Signs: Ht 5\' 7"  (1.702 m)   Wt 297 lb (134.7 kg)   LMP 11/21/2011  BMI 46.52 kg/m   Specialty Comments:  No specialty comments available.  PMFS History: Patient Active Problem List   Diagnosis Date Noted  . Multiple open wounds of lower leg   . Moderate protein-calorie malnutrition ([place])   . Non-healing ulcer ([place]) 08/19/2019  . Postoperative pain   . Labile blood pressure   . Nonischemic cardiomyopathy ([place])   . Physical debility 07/20/2019  . Idiopathic chronic venous hypertension of lower extremity with ulcer, bilateral ([place]) 07/15/2019  . H/O skin graft 05/13/2019  . CKD (chronic kidney disease), stage III 01/13/2019  . Infected wound 12/26/2018  . Idiopathic chronic venous hypertension of both lower extremities with ulcer and inflammation ([place])   . History of fall 05/10/2018  .  Hyperkalemia 02/21/2018  . Hypotension 02/21/2018  . Sepsis ([place]) 02/21/2018  . Encounter for central line placement   . Mitral regurgitation 10/30/2017  . Acute kidney injury ([place]) 07/21/2017  . Generalized weakness 07/20/2017  . Shock circulatory ([place])   . Acute respiratory failure with hypoxia ([place])   . Pulmonary edema   . Cardiac arrest ([place]) 06/26/2017  . Ventricular fibrillation ([place])   . Acute on chronic diastolic heart failure ([place])   . Abnormal ECG   . Cellulitis 04/02/2017  . AKI (acute kidney injury) ([place]) 12/17/2016  . Cellulitis and abscess of right lower extremity 12/17/2016  . Syncope and collapse 12/17/2016  . Peripheral edema 12/17/2016  . Hypokalemia 12/17/2016  . Anemia of chronic disease 12/17/2016  . Acute diastolic CHF (congestive heart failure) ([place]) 03/14/2014  . CHF (congestive heart failure) ([place]) 03/12/2014  . Sarcoidosis ([place]) 03/12/2014  . Severe sepsis ([place]) 10/17/2013  . ARF (acute renal failure) ([place]) 10/08/2013  . Cellulitis of multiple sites of lower extremity 10/08/2013  . Morbid obesity ([place]) 10/08/2013  . Volume depletion 10/08/2013  . Venous (peripheral) insufficiency 10/28/2012  . Mediastinal lymphadenopathy 10/16/2012  . Pulmonary nodules 10/16/2012  . 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 Chest pain 09/02/2012  . Asthma   . Hypertension   . Depression   . Venous stasis ulcers ([place]) 07/29/2012  . Varicose veins of lower extremities with ulcer ([place]) 07/08/2012  . Venous ulcer of leg ([place]) 07/08/2012   Past Medical History:  Diagnosis Date  . Arthritis   . Asthma   . CHF (congestive heart failure) ([place]) 03/12/2014   a. EF 40-45% by echo in 06/2017 with normal cors by cath. ICD placed following VT arrest  . Depression   . Headache(784.0)   . History of blood transfusion   . Hyperlipidemia   . Hypertension   . Lung nodules   . Myocardial infarction ([place]) 06/26/2014  . Renal  disorder    followed by [facility]  . Sarcoidosis   . Ulcer    recurring, from chronic venous insufficiency    Family History  Problem Relation Age of Onset  . Heart failure Mother   . Diabetes Mellitus II Mother   . Hypertension Mother   . Cancer Mother        unknown type  . Heart disease Father   . Stroke Father   . Diabetes Mellitus II Father     Past Surgical History:  Procedure Laterality Date  . cataract surgery Left 07-2013  . I & D EXTREMITY Bilateral 12/31/2018   Procedure: DEBRIDEMENT BILATERAL LEGS, APPLY VAC X 2;  Surgeon: [name], MD;  Location: [facility];  Service: Orthopedics;  Laterality: Bilateral;  . I &  D EXTREMITY Bilateral 01/02/2019   Procedure: REPEAT DEBRIDEMENT BILATERAL LEGS, APPLY VAC X 2;  Surgeon: [name], MD;  Location: [facility];  Service: Orthopedics;  Laterality: Bilateral;  . I & D EXTREMITY Bilateral 07/15/2019   Procedure: IRRIGATION AND DEBRIDEMENT VENOUS STASIS INSUFFICIENCY ULCERATIONS BILATERAL LOWER EXTREMITIES;  Surgeon: [name], MD;  Location: [facility];  Service: Orthopedics;  Laterality: Bilateral;  . I & D EXTREMITY Bilateral 08/23/2019   Procedure: DEBRIDEMENT EXTREMITY;  Surgeon: [name], MD;  Location: [facility];  Service: Orthopedics;  Laterality: Bilateral;  . ICD IMPLANT N/A 07/05/2017   Procedure: ICD Implant;  Surgeon: Constance Haw, MD;  Location: [facility];  Service: Cardiovascular;  Laterality: N/A;  . LEFT HEART CATH AND CORONARY ANGIOGRAPHY N/A 07/03/2017   Procedure: Left Heart Cath and Coronary Angiography;  Surgeon: Belva Crome, MD;  Location: [facility];  Service: Cardiovascular;  Laterality: N/A;  . SKIN SPLIT GRAFT Bilateral 01/07/2019   Procedure: SPLIT THICKNESS SKIN GRAFT BILATERAL LEGS, APPLY VAC;  Surgeon: [name], MD;  Location: [facility];  Service: Orthopedics;  Laterality: Bilateral;  . SKIN SPLIT GRAFT Bilateral 07/17/2019   Procedure: REPEAT IRRIGATION AND DEBRIDEMENT  BILATERAL LOWER EXTREMITIES, SKIN GRAFT;  Surgeon: [name], MD;  Location: [facility];  Service: Orthopedics;  Laterality: Bilateral;  . SKIN SPLIT GRAFT Bilateral 08/26/2019   Procedure: SKIN GRAFT SPLIT THICKNESS BILATERAL LEGS, APPLY WOUND VAC;  Surgeon: [name], MD;  Location: [facility];  Service: Orthopedics;  Laterality: Bilateral;   Social History   Occupational History  . Not on file  Tobacco Use  . Smoking status: Never Smoker  . Smokeless tobacco: Never Used  Substance and Sexual Activity  . Alcohol use: No  . Drug use: No  . Sexual activity: Not Currently

## 2019-12-14 ENCOUNTER — Ambulatory Visit: Payer: Medicare Other | Admitting: Orthopedic Surgery

## 2019-12-14 DIAGNOSIS — B965 Pseudomonas (aeruginosa) (mallei) (pseudomallei) as the cause of diseases classified elsewhere: Secondary | ICD-10-CM | POA: Diagnosis not present

## 2019-12-14 DIAGNOSIS — B964 Proteus (mirabilis) (morganii) as the cause of diseases classified elsewhere: Secondary | ICD-10-CM | POA: Diagnosis not present

## 2019-12-14 DIAGNOSIS — T8149XD Infection following a procedure, other surgical site, subsequent encounter: Secondary | ICD-10-CM | POA: Diagnosis not present

## 2019-12-14 DIAGNOSIS — I96 Gangrene, not elsewhere classified: Secondary | ICD-10-CM | POA: Diagnosis not present

## 2019-12-14 DIAGNOSIS — I872 Venous insufficiency (chronic) (peripheral): Secondary | ICD-10-CM | POA: Diagnosis not present

## 2019-12-14 DIAGNOSIS — T8131XD Disruption of external operation (surgical) wound, not elsewhere classified, subsequent encounter: Secondary | ICD-10-CM | POA: Diagnosis not present

## 2019-12-16 DIAGNOSIS — I872 Venous insufficiency (chronic) (peripheral): Secondary | ICD-10-CM | POA: Diagnosis not present

## 2019-12-16 DIAGNOSIS — T8149XD Infection following a procedure, other surgical site, subsequent encounter: Secondary | ICD-10-CM | POA: Diagnosis not present

## 2019-12-16 DIAGNOSIS — B965 Pseudomonas (aeruginosa) (mallei) (pseudomallei) as the cause of diseases classified elsewhere: Secondary | ICD-10-CM | POA: Diagnosis not present

## 2019-12-16 DIAGNOSIS — B964 Proteus (mirabilis) (morganii) as the cause of diseases classified elsewhere: Secondary | ICD-10-CM | POA: Diagnosis not present

## 2019-12-16 DIAGNOSIS — T8131XD Disruption of external operation (surgical) wound, not elsewhere classified, subsequent encounter: Secondary | ICD-10-CM | POA: Diagnosis not present

## 2019-12-16 DIAGNOSIS — I96 Gangrene, not elsewhere classified: Secondary | ICD-10-CM | POA: Diagnosis not present

## 2019-12-21 DIAGNOSIS — B965 Pseudomonas (aeruginosa) (mallei) (pseudomallei) as the cause of diseases classified elsewhere: Secondary | ICD-10-CM | POA: Diagnosis not present

## 2019-12-21 DIAGNOSIS — B964 Proteus (mirabilis) (morganii) as the cause of diseases classified elsewhere: Secondary | ICD-10-CM | POA: Diagnosis not present

## 2019-12-21 DIAGNOSIS — T8131XD Disruption of external operation (surgical) wound, not elsewhere classified, subsequent encounter: Secondary | ICD-10-CM | POA: Diagnosis not present

## 2019-12-21 DIAGNOSIS — I872 Venous insufficiency (chronic) (peripheral): Secondary | ICD-10-CM | POA: Diagnosis not present

## 2019-12-21 DIAGNOSIS — T8149XD Infection following a procedure, other surgical site, subsequent encounter: Secondary | ICD-10-CM | POA: Diagnosis not present

## 2019-12-21 DIAGNOSIS — I96 Gangrene, not elsewhere classified: Secondary | ICD-10-CM | POA: Diagnosis not present

## 2019-12-23 DIAGNOSIS — I96 Gangrene, not elsewhere classified: Secondary | ICD-10-CM | POA: Diagnosis not present

## 2019-12-23 DIAGNOSIS — J45909 Unspecified asthma, uncomplicated: Secondary | ICD-10-CM | POA: Diagnosis not present

## 2019-12-23 DIAGNOSIS — I130 Hypertensive heart and chronic kidney disease with heart failure and stage 1 through stage 4 chronic kidney disease, or unspecified chronic kidney disease: Secondary | ICD-10-CM | POA: Diagnosis not present

## 2019-12-23 DIAGNOSIS — Z6841 Body Mass Index (BMI) 40.0 and over, adult: Secondary | ICD-10-CM | POA: Diagnosis not present

## 2019-12-23 DIAGNOSIS — E440 Moderate protein-calorie malnutrition: Secondary | ICD-10-CM | POA: Diagnosis not present

## 2019-12-23 DIAGNOSIS — T8149XD Infection following a procedure, other surgical site, subsequent encounter: Secondary | ICD-10-CM | POA: Diagnosis not present

## 2019-12-23 DIAGNOSIS — M1990 Unspecified osteoarthritis, unspecified site: Secondary | ICD-10-CM | POA: Diagnosis not present

## 2019-12-23 DIAGNOSIS — I5033 Acute on chronic diastolic (congestive) heart failure: Secondary | ICD-10-CM | POA: Diagnosis not present

## 2019-12-23 DIAGNOSIS — T8131XD Disruption of external operation (surgical) wound, not elsewhere classified, subsequent encounter: Secondary | ICD-10-CM | POA: Diagnosis not present

## 2019-12-23 DIAGNOSIS — E785 Hyperlipidemia, unspecified: Secondary | ICD-10-CM | POA: Diagnosis not present

## 2019-12-23 DIAGNOSIS — D631 Anemia in chronic kidney disease: Secondary | ICD-10-CM | POA: Diagnosis not present

## 2019-12-23 DIAGNOSIS — B964 Proteus (mirabilis) (morganii) as the cause of diseases classified elsewhere: Secondary | ICD-10-CM | POA: Diagnosis not present

## 2019-12-23 DIAGNOSIS — I252 Old myocardial infarction: Secondary | ICD-10-CM | POA: Diagnosis not present

## 2019-12-23 DIAGNOSIS — I872 Venous insufficiency (chronic) (peripheral): Secondary | ICD-10-CM | POA: Diagnosis not present

## 2019-12-23 DIAGNOSIS — R911 Solitary pulmonary nodule: Secondary | ICD-10-CM | POA: Diagnosis not present

## 2019-12-23 DIAGNOSIS — F329 Major depressive disorder, single episode, unspecified: Secondary | ICD-10-CM | POA: Diagnosis not present

## 2019-12-23 DIAGNOSIS — B965 Pseudomonas (aeruginosa) (mallei) (pseudomallei) as the cause of diseases classified elsewhere: Secondary | ICD-10-CM | POA: Diagnosis not present

## 2019-12-23 DIAGNOSIS — I428 Other cardiomyopathies: Secondary | ICD-10-CM | POA: Diagnosis not present

## 2019-12-23 DIAGNOSIS — D869 Sarcoidosis, unspecified: Secondary | ICD-10-CM | POA: Diagnosis not present

## 2019-12-23 DIAGNOSIS — Z95810 Presence of automatic (implantable) cardiac defibrillator: Secondary | ICD-10-CM | POA: Diagnosis not present

## 2019-12-31 ENCOUNTER — Encounter: Payer: Self-pay | Admitting: Orthopedic Surgery

## 2019-12-31 ENCOUNTER — Ambulatory Visit (INDEPENDENT_AMBULATORY_CARE_PROVIDER_SITE_OTHER): Payer: Medicare Other | Admitting: Family Medicine

## 2019-12-31 ENCOUNTER — Encounter: Payer: Self-pay | Admitting: Family Medicine

## 2019-12-31 ENCOUNTER — Ambulatory Visit (INDEPENDENT_AMBULATORY_CARE_PROVIDER_SITE_OTHER): Payer: Medicare Other | Admitting: Orthopedic Surgery

## 2019-12-31 ENCOUNTER — Other Ambulatory Visit: Payer: Self-pay

## 2019-12-31 VITALS — BP 126/82 | HR 64 | Temp 98.1°F | Ht 67.0 in | Wt 296.3 lb

## 2019-12-31 VITALS — Ht 67.0 in | Wt 296.0 lb

## 2019-12-31 DIAGNOSIS — I87313 Chronic venous hypertension (idiopathic) with ulcer of bilateral lower extremity: Secondary | ICD-10-CM

## 2019-12-31 DIAGNOSIS — L97919 Non-pressure chronic ulcer of unspecified part of right lower leg with unspecified severity: Secondary | ICD-10-CM

## 2019-12-31 DIAGNOSIS — I10 Essential (primary) hypertension: Secondary | ICD-10-CM

## 2019-12-31 DIAGNOSIS — I428 Other cardiomyopathies: Secondary | ICD-10-CM

## 2019-12-31 DIAGNOSIS — L97929 Non-pressure chronic ulcer of unspecified part of left lower leg with unspecified severity: Secondary | ICD-10-CM

## 2019-12-31 DIAGNOSIS — Z79899 Other long term (current) drug therapy: Secondary | ICD-10-CM

## 2019-12-31 MED ORDER — HYDROCODONE-ACETAMINOPHEN 5-325 MG PO TABS: 1.0000 | ORAL_TABLET | Freq: Four times a day (QID) | ORAL | 0 refills | Status: DC | PRN

## 2019-12-31 NOTE — Progress Notes (Signed)
1/7/202110:35 AM  Kari [name] 15-Jun-1961, 59 y.o., female [id number]  Chief Complaint  Patient presents with  . Hypertension  . Venous Stasis Ulcer    goes for tx every 3 months now  . health maintenance    declines mammo and pap    HPI:   Patient is a 59 y.o. female with past medical history significant for s/p cardiac arrest in 2018 now with ACID,nonsichemic cardiomyopathy, HTN, venous stasis ulcers s/p skin grafts, prediabeteswho presents today for routine followup  Last OV Oct 2020  - no changes Saw cards nov 2020 - echo ordered - stable  Last OV with Dr Sharol Given Dec 2020, fu 3 weeks  She is overall doing well She reports that she saw renal and reports they were doing well, she takes lasix 40mg  once a day She reports that she has eating a "bit more" during the holidays but she is getting back into smaller portions She is taking all her medications as prescribed She takes vicodin prn during dressing changes and debridement or when achy pmp reviewed She has no acute concerns today  Lab Results  Component Value Date   HGBA1C 5.8 (H) 08/20/2019   HGBA1C 5.6 12/17/2016   HGBA1C 5.5 03/12/2014   Lab Results  Component Value Date   LDLCALC 103 (H) 09/28/2019   CREATININE 1.13 (H) 09/28/2019    Wt Readings from Last 3 Encounters:  12/31/19 296 lb 4.8 oz (134.4 kg)  12/10/19 297 lb (134.7 kg)  12/07/19 297 lb (134.7 kg)   Oct weight 281 lbs  Depression screen [facility] 2/9 12/31/2019 12/31/2019 09/28/2019  Decreased Interest 0 0 0  Down, Depressed, Hopeless 0 0 0  PHQ - 2 Score 0 0 0  Altered sleeping - - -  Tired, decreased energy - - -  Change in appetite - - -  Feeling bad or failure about yourself  - - -  Trouble concentrating - - -  Moving slowly or fidgety/restless - - -  Suicidal thoughts - - -  PHQ-9 Score - - -  Difficult doing work/chores - - -  Some recent data might be hidden    Fall Risk  12/31/2019 09/28/2019 05/13/2019 02/11/2019 09/11/2018  Falls in  the past year? 0 0 0 0 No  Number falls in past yr: 0 0 - - -  Comment - - - - -  Injury with Fall? 0 0 0 0 -  Comment - - - - -  Risk for fall due to : - - - - -  Risk for fall due to: Comment - - - - -  Follow up - - - - -     Allergies  Allergen Reactions  . Tramadol Other (See Comments)    hallucination    Prior to Admission medications   Medication Sig Start Date End Date Taking? Authorizing Provider  acetaminophen (TYLENOL) 325 MG tablet Take 1-2 tablets (325-650 mg total) by mouth every 4 (four) hours as needed for mild pain. 07/22/19  Yes Love, Ivan Anchors, PA-C  albuterol (PROVENTIL HFA;VENTOLIN HFA) 108 (90 Base) MCG/ACT inhaler Inhale 2 puffs into the lungs every 6 (six) hours as needed for shortness of breath. 02/02/19  Yes Medina-Vargas, Monina C, NP  allopurinol (ZYLOPRIM) 100 MG tablet Take 2 tablets (200 mg total) by mouth daily. 05/13/19  Yes Rutherford Guys, MD  aspirin EC 81 MG tablet Take 81 mg by mouth daily.   Yes [provider]  carvedilol (COREG) 6.25  MG tablet Take 1 tablet (6.25 mg total) by mouth 2 (two) times daily. 02/02/19  Yes Medina-Vargas, Monina C, NP  furosemide (LASIX) 40 MG tablet Take 40 mg by mouth daily.   Yes [provider]  HYDROcodone-acetaminophen (NORCO/VICODIN) 5-325 MG tablet Take 1 tablet by mouth every 6 (six) hours as needed for moderate pain. 11/18/19  Yes Dondra Prader R, NP  polyethylene glycol (MIRALAX / GLYCOLAX) 17 g packet Take 17 g by mouth daily as needed. 09/03/19  Yes Love, Ivan Anchors, PA-C  senna-docusate (SENOKOT-S) 8.6-50 MG tablet Take 2 tablets by mouth 2 (two) times daily. 09/03/19  Yes Love, Ivan Anchors, PA-C    Past Medical History:  Diagnosis Date  . Arthritis   . Asthma   . CHF (congestive heart failure) ([place]) 03/12/2014   a. EF 40-45% by echo in 06/2017 with normal cors by cath. ICD placed following VT arrest  . Depression   . Headache(784.0)   . History of blood transfusion   . Hyperlipidemia   .  Hypertension   . Lung nodules   . Myocardial infarction ([place]) 06/26/2014  . Renal disorder    followed by [facility]  . Sarcoidosis   . Ulcer    recurring, from chronic venous insufficiency    Past Surgical History:  Procedure Laterality Date  . cataract surgery Left 07-2013  . I & D EXTREMITY Bilateral 12/31/2018   Procedure: DEBRIDEMENT BILATERAL LEGS, APPLY VAC X 2;  Surgeon: [name], MD;  Location: [facility];  Service: Orthopedics;  Laterality: Bilateral;  . I & D EXTREMITY Bilateral 01/02/2019   Procedure: REPEAT DEBRIDEMENT BILATERAL LEGS, APPLY VAC X 2;  Surgeon: [name], MD;  Location: [facility];  Service: Orthopedics;  Laterality: Bilateral;  . I & D EXTREMITY Bilateral 07/15/2019   Procedure: IRRIGATION AND DEBRIDEMENT VENOUS STASIS INSUFFICIENCY ULCERATIONS BILATERAL LOWER EXTREMITIES;  Surgeon: [name], MD;  Location: [facility];  Service: Orthopedics;  Laterality: Bilateral;  . I & D EXTREMITY Bilateral 08/23/2019   Procedure: DEBRIDEMENT EXTREMITY;  Surgeon: [name], MD;  Location: [facility];  Service: Orthopedics;  Laterality: Bilateral;  . ICD IMPLANT N/A 07/05/2017   Procedure: ICD Implant;  Surgeon: Constance Haw, MD;  Location: [facility];  Service: Cardiovascular;  Laterality: N/A;  . LEFT HEART CATH AND CORONARY ANGIOGRAPHY N/A 07/03/2017   Procedure: Left Heart Cath and Coronary Angiography;  Surgeon: Belva Crome, MD;  Location: [facility];  Service: Cardiovascular;  Laterality: N/A;  . SKIN SPLIT GRAFT Bilateral 01/07/2019   Procedure: SPLIT THICKNESS SKIN GRAFT BILATERAL LEGS, APPLY VAC;  Surgeon: [name], MD;  Location: [facility];  Service: Orthopedics;  Laterality: Bilateral;  . SKIN SPLIT GRAFT Bilateral 07/17/2019   Procedure: REPEAT IRRIGATION AND DEBRIDEMENT BILATERAL LOWER EXTREMITIES, SKIN GRAFT;  Surgeon: [name], MD;  Location: [facility];  Service: Orthopedics;  Laterality: Bilateral;  . SKIN SPLIT GRAFT Bilateral  08/26/2019   Procedure: SKIN GRAFT SPLIT THICKNESS BILATERAL LEGS, APPLY WOUND VAC;  Surgeon: [name], MD;  Location: [facility];  Service: Orthopedics;  Laterality: Bilateral;    Social History   Tobacco Use  . Smoking status: Never Smoker  . Smokeless tobacco: Never Used  Substance Use Topics  . Alcohol use: No    Family History  Problem Relation Age of Onset  . Heart failure Mother   . Diabetes Mellitus II Mother   . Hypertension Mother   .  Cancer Mother        unknown type  . Heart disease Father   . Stroke Father   . Diabetes Mellitus II Father     Review of Systems  Constitutional: Negative for chills and fever.  Respiratory: Negative for cough and shortness of breath.   Cardiovascular: Negative for chest pain, palpitations and leg swelling.  Gastrointestinal: Negative for abdominal pain, nausea and vomiting.     OBJECTIVE:  Today's Vitals   12/31/19 1007  BP: 126/82  Pulse: 64  Temp: 98.1 F (36.7 C)  SpO2: 97%  Weight: 296 lb 4.8 oz (134.4 kg)  Height: 5\' 7"  (1.702 m)   Body mass index is 46.41 kg/m.   Physical Exam Vitals and nursing note reviewed.  Constitutional:      Appearance: She is well-developed.  HENT:     Head: Normocephalic and atraumatic.     Mouth/Throat:     Pharynx: No oropharyngeal exudate.  Eyes:     General: No scleral icterus.    Conjunctiva/sclera: Conjunctivae normal.     Pupils: Pupils are equal, round, and reactive to light.  Cardiovascular:     Rate and Rhythm: Normal rate and regular rhythm.     Heart sounds: Normal heart sounds. No murmur. No friction rub. No gallop.   Pulmonary:     Effort: Pulmonary effort is normal.     Breath sounds: Normal breath sounds. No wheezing or rales.  Musculoskeletal:     Cervical back: Neck supple.  Skin:    General: Skin is warm and dry.  Neurological:     Mental Status: She is alert and oriented to person, place, and time.     No results found for this or any previous visit  (from the past 24 hour(s)).  No results found.   ASSESSMENT and PLAN  1. Essential hypertension, benign Controlled. Continue current regime.  - TSH - Lipid panel - CMET with GFR  2. Idiopathic chronic venous hypertension of lower extremity with ulcer, bilateral ([place]) Managed by ortho. Overall doing much better. pmp reviewed. vicodin refilled  3. Medication management  4. Nonischemic cardiomyopathy (HCC) Stable. Recent echo unchanged. Managed by cards  Other orders - HYDROcodone-acetaminophen (NORCO/VICODIN) 5-325 MG tablet; Take 1 tablet by mouth every 6 (six) hours as needed for moderate pain.  Return in about 3 months (around 03/30/2020).    Rutherford Guys, MD [facility] [place], [place] [address] Ph.  [phone] Fax [phone]

## 2019-12-31 NOTE — Progress Notes (Signed)
Office Visit Note   Patient: Kari [name]           Date of Birth: 1961-06-07           MRN: [id number] Visit Date: 12/31/2019              Requested by: Rutherford Guys, MD [address] [place],  [place] [address] PCP: Rutherford Guys, MD  Chief Complaint  Patient presents with  . Left Leg - Follow-up  . Right Leg - Follow-up      HPI: This is a pleasant woman with bilateral lower extremity venous stasis disease.  She recently got CircAid wraps and finds them very helpful use  Assessment & Plan: Visit Diagnoses: No diagnosis found.  Plan: She continues to do well.  We will follow up with her in 2 weeks  Follow-Up Instructions: No follow-ups on file.   Ortho Exam  Patient is alert, oriented, no adenopathy, well-dressed, normal affect, normal respiratory effort. Bilateral lower extremities healthy granulation tissue without any surrounding cellulitis.  She continues to have good healthy skin ingrowth and just mild amount of serous drainage.  Compartments are soft.  No foul odor.  Imaging: No results found. No images are attached to the encounter.  Labs: Lab Results  Component Value Date   HGBA1C 5.8 (H) 08/20/2019   HGBA1C 5.6 12/17/2016   HGBA1C 5.5 03/12/2014   REPTSTATUS 08/28/2019 FINAL 08/23/2019   GRAMSTAIN  08/23/2019    MODERATE WBC PRESENT, PREDOMINANTLY PMN FEW GRAM POSITIVE COCCI IN PAIRS IN CLUSTERS RARE GRAM NEGATIVE RODS    CULT  08/23/2019    FEW PSEUDOMONAS AERUGINOSA FEW PROTEUS MIRABILIS FEW STAPHYLOCOCCUS SPECIES (COAGULASE NEGATIVE) NO ANAEROBES ISOLATED Performed at [facility], [place] [address] [place], [place] [address]    LABORGA PSEUDOMONAS AERUGINOSA 08/23/2019   LABORGA PROTEUS MIRABILIS 08/23/2019     Lab Results  Component Value Date   ALBUMIN 3.7 (L) 09/28/2019   ALBUMIN 1.9 (L) 08/28/2019   ALBUMIN 2.9 (L) 08/19/2019   PREALBUMIN 19.7 08/31/2019    Lab Results  Component Value Date   MG 2.4 01/05/2019   MG 2.3 01/02/2019   MG 2.0 12/27/2018   Lab Results  Component Value Date   VD25OH 26.7 (L) 08/22/2019    Lab Results  Component Value Date   PREALBUMIN 19.7 08/31/2019   CBC EXTENDED Latest Ref Rng & Units 08/31/2019 08/29/2019 08/28/2019  WBC 4.0 - 10.5 K/uL 5.5 7.6 10.8(H)  RBC 3.87 - 5.11 MIL/uL 2.85(L) 2.73(L) 2.58(L)  HGB 12.0 - 15.0 g/dL 8.2(L) 7.7(L) 7.4(L)  HCT 36.0 - 46.0 % 27.3(L) 26.9(L) 24.9(L)  PLT 150 - 400 K/uL 237 244 256  NEUTROABS 1.7 - 7.7 K/uL 3.5 - 7.5  LYMPHSABS 0.7 - 4.0 K/uL 1.2 - 2.1     Body mass index is 46.36 kg/m.  Orders:  No orders of the defined types were placed in this encounter.  No orders of the defined types were placed in this encounter.    Procedures: No procedures performed  Clinical Data: No additional findings.  ROS:  All other systems negative, except as noted in the HPI. Review of Systems  Objective: Vital Signs: Ht 5\' 7"  (1.702 m)   Wt 296 lb (134.3 kg)   LMP 11/21/2011   BMI 46.36 kg/m   Specialty Comments:  No specialty comments available.  PMFS History: Patient Active Problem List   Diagnosis Date Noted  . Multiple open wounds of lower leg   .  Moderate protein-calorie malnutrition ([place])   . Non-healing ulcer ([place]) 08/19/2019  . Postoperative pain   . Labile blood pressure   . Nonischemic cardiomyopathy ([place])   . Physical debility 07/20/2019  . Idiopathic chronic venous hypertension of lower extremity with ulcer, bilateral ([place]) 07/15/2019  . H/O skin graft 05/13/2019  . CKD (chronic kidney disease), stage III 01/13/2019  . Infected wound 12/26/2018  . Idiopathic chronic venous hypertension of both lower extremities with ulcer and inflammation ([place])   . History of fall 05/10/2018  . Hyperkalemia 02/21/2018  . Hypotension 02/21/2018  . Sepsis ([place]) 02/21/2018  . Encounter for central line placement   . Mitral regurgitation 10/30/2017  . Acute kidney injury ([place]) 07/21/2017  . Generalized weakness  07/20/2017  . Shock circulatory ([place])   . Acute respiratory failure with hypoxia ([place])   . Pulmonary edema   . Cardiac arrest ([place]) 06/26/2017  . Ventricular fibrillation ([place])   . Acute on chronic diastolic heart failure ([place])   . Abnormal ECG   . Cellulitis 04/02/2017  . AKI (acute kidney injury) ([place]) 12/17/2016  . Cellulitis and abscess of right lower extremity 12/17/2016  . Syncope and collapse 12/17/2016  . Peripheral edema 12/17/2016  . Hypokalemia 12/17/2016  . Anemia of chronic disease 12/17/2016  . Acute diastolic CHF (congestive heart failure) ([place]) 03/14/2014  . CHF (congestive heart failure) ([place]) 03/12/2014  . Sarcoidosis ([place]) 03/12/2014  . Severe sepsis ([place]) 10/17/2013  . ARF (acute renal failure) ([place]) 10/08/2013  . Cellulitis of multiple sites of lower extremity 10/08/2013  . Morbid obesity ([place]) 10/08/2013  . Volume depletion 10/08/2013  . Venous (peripheral) insufficiency 10/28/2012  . Mediastinal lymphadenopathy 10/16/2012  . Pulmonary nodules 10/16/2012  . 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 Chest pain 09/02/2012  . Asthma   . Hypertension   . Depression   . Venous stasis ulcers ([place]) 07/29/2012  . Varicose veins of lower extremities with ulcer ([place]) 07/08/2012  . Venous ulcer of leg ([place]) 07/08/2012   Past Medical History:  Diagnosis Date  . Arthritis   . Asthma   . CHF (congestive heart failure) ([place]) 03/12/2014   a. EF 40-45% by echo in 06/2017 with normal cors by cath. ICD placed following VT arrest  . Depression   . Headache(784.0)   . History of blood transfusion   . Hyperlipidemia   . Hypertension   . Lung nodules   . Myocardial infarction ([place]) 06/26/2014  . Renal disorder    followed by [facility]  . Sarcoidosis   . Ulcer    recurring, from chronic venous insufficiency    Family History  Problem Relation Age of Onset  . Heart failure Mother   . Diabetes Mellitus II  Mother   . Hypertension Mother   . Cancer Mother        unknown type  . Heart disease Father   . Stroke Father   . Diabetes Mellitus II Father     Past Surgical History:  Procedure Laterality Date  . cataract surgery Left 07-2013  . I & D EXTREMITY Bilateral 12/31/2018   Procedure: DEBRIDEMENT BILATERAL LEGS, APPLY VAC X 2;  Surgeon: [name], MD;  Location: [facility];  Service: Orthopedics;  Laterality: Bilateral;  . I & D EXTREMITY Bilateral 01/02/2019   Procedure: REPEAT DEBRIDEMENT BILATERAL LEGS, APPLY VAC X 2;  Surgeon: [name], MD;  Location: [facility];  Service: Orthopedics;  Laterality: Bilateral;  . I &  D EXTREMITY Bilateral 07/15/2019   Procedure: IRRIGATION AND DEBRIDEMENT VENOUS STASIS INSUFFICIENCY ULCERATIONS BILATERAL LOWER EXTREMITIES;  Surgeon: [name], MD;  Location: [facility];  Service: Orthopedics;  Laterality: Bilateral;  . I & D EXTREMITY Bilateral 08/23/2019   Procedure: DEBRIDEMENT EXTREMITY;  Surgeon: [name], MD;  Location: [facility];  Service: Orthopedics;  Laterality: Bilateral;  . ICD IMPLANT N/A 07/05/2017   Procedure: ICD Implant;  Surgeon: Constance Haw, MD;  Location: [facility];  Service: Cardiovascular;  Laterality: N/A;  . LEFT HEART CATH AND CORONARY ANGIOGRAPHY N/A 07/03/2017   Procedure: Left Heart Cath and Coronary Angiography;  Surgeon: Belva Crome, MD;  Location: [facility];  Service: Cardiovascular;  Laterality: N/A;  . SKIN SPLIT GRAFT Bilateral 01/07/2019   Procedure: SPLIT THICKNESS SKIN GRAFT BILATERAL LEGS, APPLY VAC;  Surgeon: [name], MD;  Location: [facility];  Service: Orthopedics;  Laterality: Bilateral;  . SKIN SPLIT GRAFT Bilateral 07/17/2019   Procedure: REPEAT IRRIGATION AND DEBRIDEMENT BILATERAL LOWER EXTREMITIES, SKIN GRAFT;  Surgeon: [name], MD;  Location: [facility];  Service: Orthopedics;  Laterality: Bilateral;  . SKIN SPLIT GRAFT Bilateral 08/26/2019   Procedure: SKIN GRAFT SPLIT THICKNESS  BILATERAL LEGS, APPLY WOUND VAC;  Surgeon: [name], MD;  Location: [facility];  Service: Orthopedics;  Laterality: Bilateral;   Social History   Occupational History  . Not on file  Tobacco Use  . Smoking status: Never Smoker  . Smokeless tobacco: Never Used  Substance and Sexual Activity  . Alcohol use: No  . Drug use: No  . Sexual activity: Not Currently

## 2019-12-31 NOTE — Patient Instructions (Signed)
° ° ° °  If you have lab work done today you will be contacted with your lab results within the next 2 weeks.  If you have not heard from us then please contact us. The fastest way to get your results is to register for My Chart. ° ° °IF you received an x-ray today, you will receive an invoice from [facility]. Please contact [facility] at [phone] with questions or concerns regarding your invoice.  ° °IF you received labwork today, you will receive an invoice from [facility]. Please contact [facility] at [phone] with questions or concerns regarding your invoice.  ° °Our billing staff will not be able to assist you with questions regarding bills from these companies. ° °You will be contacted with the lab results as soon as they are available. The fastest way to get your results is to activate your My Chart account. Instructions are located on the last page of this paperwork. If you have not heard from us regarding the results in 2 weeks, please contact this office. °  ° ° ° °

## 2020-01-01 DIAGNOSIS — I96 Gangrene, not elsewhere classified: Secondary | ICD-10-CM | POA: Diagnosis not present

## 2020-01-01 DIAGNOSIS — I872 Venous insufficiency (chronic) (peripheral): Secondary | ICD-10-CM | POA: Diagnosis not present

## 2020-01-01 DIAGNOSIS — T8149XD Infection following a procedure, other surgical site, subsequent encounter: Secondary | ICD-10-CM | POA: Diagnosis not present

## 2020-01-01 DIAGNOSIS — B964 Proteus (mirabilis) (morganii) as the cause of diseases classified elsewhere: Secondary | ICD-10-CM | POA: Diagnosis not present

## 2020-01-01 DIAGNOSIS — B965 Pseudomonas (aeruginosa) (mallei) (pseudomallei) as the cause of diseases classified elsewhere: Secondary | ICD-10-CM | POA: Diagnosis not present

## 2020-01-01 DIAGNOSIS — T8131XD Disruption of external operation (surgical) wound, not elsewhere classified, subsequent encounter: Secondary | ICD-10-CM | POA: Diagnosis not present

## 2020-01-01 LAB — CMP14+EGFR
ALT: 7 IU/L (ref 0–32)
AST: 10 IU/L (ref 0–40)
Albumin/Globulin Ratio: 0.9 — ABNORMAL LOW (ref 1.2–2.2)
Albumin: 3.5 g/dL — ABNORMAL LOW (ref 3.8–4.9)
Alkaline Phosphatase: 82 IU/L (ref 39–117)
BUN/Creatinine Ratio: 15 (ref 9–23)
BUN: 18 mg/dL (ref 6–24)
Bilirubin Total: 0.7 mg/dL (ref 0.0–1.2)
CO2: 23 mmol/L (ref 20–29)
Calcium: 9.4 mg/dL (ref 8.7–10.2)
Chloride: 102 mmol/L (ref 96–106)
Creatinine, Ser: 1.17 mg/dL — ABNORMAL HIGH (ref 0.57–1.00)
GFR calc Af Amer: 60 mL/min/{1.73_m2} (ref >59)
GFR calc non Af Amer: 52 mL/min/{1.73_m2} — ABNORMAL LOW (ref >59)
Globulin, Total: 4 g/dL (ref 1.5–4.5)
Glucose: 94 mg/dL (ref 65–99)
Potassium: 3.9 mmol/L (ref 3.5–5.2)
Sodium: 140 mmol/L (ref 134–144)
Total Protein: 7.5 g/dL (ref 6.0–8.5)

## 2020-01-01 LAB — LIPID PANEL
Chol/HDL Ratio: 4.2 ratio (ref 0.0–4.4)
Cholesterol, Total: 198 mg/dL (ref 100–199)
HDL: 47 mg/dL (ref >39)
LDL Chol Calc (NIH): 131 mg/dL — ABNORMAL HIGH (ref 0–99)
Triglycerides: 112 mg/dL (ref 0–149)
VLDL Cholesterol Cal: 20 mg/dL (ref 5–40)

## 2020-01-01 LAB — TSH: TSH: 1.78 u[IU]/mL (ref 0.450–4.500)

## 2020-01-04 DIAGNOSIS — T8149XD Infection following a procedure, other surgical site, subsequent encounter: Secondary | ICD-10-CM | POA: Diagnosis not present

## 2020-01-04 DIAGNOSIS — B964 Proteus (mirabilis) (morganii) as the cause of diseases classified elsewhere: Secondary | ICD-10-CM | POA: Diagnosis not present

## 2020-01-04 DIAGNOSIS — T8131XD Disruption of external operation (surgical) wound, not elsewhere classified, subsequent encounter: Secondary | ICD-10-CM | POA: Diagnosis not present

## 2020-01-04 DIAGNOSIS — I96 Gangrene, not elsewhere classified: Secondary | ICD-10-CM | POA: Diagnosis not present

## 2020-01-04 DIAGNOSIS — I872 Venous insufficiency (chronic) (peripheral): Secondary | ICD-10-CM | POA: Diagnosis not present

## 2020-01-04 DIAGNOSIS — B965 Pseudomonas (aeruginosa) (mallei) (pseudomallei) as the cause of diseases classified elsewhere: Secondary | ICD-10-CM | POA: Diagnosis not present

## 2020-01-08 ENCOUNTER — Encounter: Payer: Self-pay | Admitting: Radiology

## 2020-01-11 DIAGNOSIS — B964 Proteus (mirabilis) (morganii) as the cause of diseases classified elsewhere: Secondary | ICD-10-CM | POA: Diagnosis not present

## 2020-01-11 DIAGNOSIS — T8131XD Disruption of external operation (surgical) wound, not elsewhere classified, subsequent encounter: Secondary | ICD-10-CM | POA: Diagnosis not present

## 2020-01-11 DIAGNOSIS — B965 Pseudomonas (aeruginosa) (mallei) (pseudomallei) as the cause of diseases classified elsewhere: Secondary | ICD-10-CM | POA: Diagnosis not present

## 2020-01-11 DIAGNOSIS — T8149XD Infection following a procedure, other surgical site, subsequent encounter: Secondary | ICD-10-CM | POA: Diagnosis not present

## 2020-01-11 DIAGNOSIS — I96 Gangrene, not elsewhere classified: Secondary | ICD-10-CM | POA: Diagnosis not present

## 2020-01-11 DIAGNOSIS — I872 Venous insufficiency (chronic) (peripheral): Secondary | ICD-10-CM | POA: Diagnosis not present

## 2020-01-14 ENCOUNTER — Encounter: Payer: Self-pay | Admitting: Orthopedic Surgery

## 2020-01-14 ENCOUNTER — Other Ambulatory Visit: Payer: Self-pay

## 2020-01-14 ENCOUNTER — Ambulatory Visit (INDEPENDENT_AMBULATORY_CARE_PROVIDER_SITE_OTHER): Payer: Medicare Other | Admitting: Orthopedic Surgery

## 2020-01-14 VITALS — Ht 67.0 in | Wt 296.0 lb

## 2020-01-14 DIAGNOSIS — I87313 Chronic venous hypertension (idiopathic) with ulcer of bilateral lower extremity: Secondary | ICD-10-CM

## 2020-01-14 DIAGNOSIS — L97929 Non-pressure chronic ulcer of unspecified part of left lower leg with unspecified severity: Secondary | ICD-10-CM

## 2020-01-14 DIAGNOSIS — L97919 Non-pressure chronic ulcer of unspecified part of right lower leg with unspecified severity: Secondary | ICD-10-CM

## 2020-01-14 NOTE — Progress Notes (Signed)
Office Visit Note   Patient: Kari [name]           Date of Birth: 08/22/1961           MRN: [id number] Visit Date: 01/14/2020              Requested by: Rutherford Guys, MD [address] [place],  [place] [address] PCP: Rutherford Guys, MD  Chief Complaint  Patient presents with  . Right Leg - Follow-up  . Left Leg - Follow-up      HPI: Patient is a 59 year old woman with venous stasis insufficiency and chronic stasis ulcers bilateral lower extremities.  She is currently wearing CircAid compression wraps she feels like she is stable and making improvement.  Assessment & Plan: Visit Diagnoses:  1. Idiopathic chronic venous hypertension of lower extremity with ulcer, bilateral ([place])     Plan: She will continue with the CircAid compression wraps continue with gauze over the wound for drainage.  She is given a handicap parking application.  Follow-Up Instructions: Return in about 3 weeks (around 02/04/2020).   Ortho Exam  Patient is alert, oriented, no adenopathy, well-dressed, normal affect, normal respiratory effort. Examination patient's legs are stable there is brawny skin color changes there is no cellulitis there is no odor no drainage the wound bed has healthy granulation tissue with superficial epithelialization around the edges.  No sign of infection.  Imaging: No results found. No images are attached to the encounter.  Labs: Lab Results  Component Value Date   HGBA1C 5.8 (H) 08/20/2019   HGBA1C 5.6 12/17/2016   HGBA1C 5.5 03/12/2014   REPTSTATUS 08/28/2019 FINAL 08/23/2019   GRAMSTAIN  08/23/2019    MODERATE WBC PRESENT, PREDOMINANTLY PMN FEW GRAM POSITIVE COCCI IN PAIRS IN CLUSTERS RARE GRAM NEGATIVE RODS    CULT  08/23/2019    FEW PSEUDOMONAS AERUGINOSA FEW PROTEUS MIRABILIS FEW STAPHYLOCOCCUS SPECIES (COAGULASE NEGATIVE) NO ANAEROBES ISOLATED Performed at [facility], [place] [address] [place], [place] [address]    LABORGA PSEUDOMONAS  AERUGINOSA 08/23/2019   LABORGA PROTEUS MIRABILIS 08/23/2019     Lab Results  Component Value Date   ALBUMIN 3.5 (L) 12/31/2019   ALBUMIN 3.7 (L) 09/28/2019   ALBUMIN 1.9 (L) 08/28/2019   PREALBUMIN 19.7 08/31/2019    Lab Results  Component Value Date   MG 2.4 01/05/2019   MG 2.3 01/02/2019   MG 2.0 12/27/2018   Lab Results  Component Value Date   VD25OH 26.7 (L) 08/22/2019    Lab Results  Component Value Date   PREALBUMIN 19.7 08/31/2019   CBC EXTENDED Latest Ref Rng & Units 08/31/2019 08/29/2019 08/28/2019  WBC 4.0 - 10.5 K/uL 5.5 7.6 10.8(H)  RBC 3.87 - 5.11 MIL/uL 2.85(L) 2.73(L) 2.58(L)  HGB 12.0 - 15.0 g/dL 8.2(L) 7.7(L) 7.4(L)  HCT 36.0 - 46.0 % 27.3(L) 26.9(L) 24.9(L)  PLT 150 - 400 K/uL 237 244 256  NEUTROABS 1.7 - 7.7 K/uL 3.5 - 7.5  LYMPHSABS 0.7 - 4.0 K/uL 1.2 - 2.1     Body mass index is 46.36 kg/m.  Orders:  No orders of the defined types were placed in this encounter.  No orders of the defined types were placed in this encounter.    Procedures: No procedures performed  Clinical Data: No additional findings.  ROS:  All other systems negative, except as noted in the HPI. Review of Systems  Objective: Vital Signs: Ht 5\' 7"  (1.702 m)   Wt 296 lb (134.3 kg)  LMP 11/21/2011   BMI 46.36 kg/m   Specialty Comments:  No specialty comments available.  PMFS History: Patient Active Problem List   Diagnosis Date Noted  . Multiple open wounds of lower leg   . Moderate protein-calorie malnutrition ([place])   . Non-healing ulcer ([place]) 08/19/2019  . Postoperative pain   . Labile blood pressure   . Nonischemic cardiomyopathy ([place])   . Physical debility 07/20/2019  . Idiopathic chronic venous hypertension of lower extremity with ulcer, bilateral ([place]) 07/15/2019  . H/O skin graft 05/13/2019  . CKD (chronic kidney disease), stage III 01/13/2019  . Infected wound 12/26/2018  . Idiopathic chronic venous hypertension of both lower extremities with  ulcer and inflammation ([place])   . History of fall 05/10/2018  . Hyperkalemia 02/21/2018  . Hypotension 02/21/2018  . Sepsis ([place]) 02/21/2018  . Encounter for central line placement   . Mitral regurgitation 10/30/2017  . Acute kidney injury ([place]) 07/21/2017  . Generalized weakness 07/20/2017  . Shock circulatory ([place])   . Acute respiratory failure with hypoxia ([place])   . Pulmonary edema   . Cardiac arrest ([place]) 06/26/2017  . Ventricular fibrillation ([place])   . Acute on chronic diastolic heart failure ([place])   . Abnormal ECG   . Cellulitis 04/02/2017  . AKI (acute kidney injury) ([place]) 12/17/2016  . Cellulitis and abscess of right lower extremity 12/17/2016  . Syncope and collapse 12/17/2016  . Peripheral edema 12/17/2016  . Hypokalemia 12/17/2016  . Anemia of chronic disease 12/17/2016  . Acute diastolic CHF (congestive heart failure) ([place]) 03/14/2014  . CHF (congestive heart failure) ([place]) 03/12/2014  . Sarcoidosis ([place]) 03/12/2014  . Severe sepsis ([place]) 10/17/2013  . ARF (acute renal failure) ([place]) 10/08/2013  . Cellulitis of multiple sites of lower extremity 10/08/2013  . Morbid obesity ([place]) 10/08/2013  . Volume depletion 10/08/2013  . Venous (peripheral) insufficiency 10/28/2012  . Mediastinal lymphadenopathy 10/16/2012  . Pulmonary nodules 10/16/2012  . 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 Chest pain 09/02/2012  . Asthma   . Hypertension   . Depression   . Venous stasis ulcers ([place]) 07/29/2012  . Varicose veins of lower extremities with ulcer ([place]) 07/08/2012  . Venous ulcer of leg ([place]) 07/08/2012   Past Medical History:  Diagnosis Date  . Arthritis   . Asthma   . CHF (congestive heart failure) ([place]) 03/12/2014   a. EF 40-45% by echo in 06/2017 with normal cors by cath. ICD placed following VT arrest  . Depression   . Headache(784.0)   . History of blood transfusion   . Hyperlipidemia   . Hypertension   .  Lung nodules   . Myocardial infarction ([place]) 06/26/2014  . Renal disorder    followed by [facility]  . Sarcoidosis   . Ulcer    recurring, from chronic venous insufficiency    Family History  Problem Relation Age of Onset  . Heart failure Mother   . Diabetes Mellitus II Mother   . Hypertension Mother   . Cancer Mother        unknown type  . Heart disease Father   . Stroke Father   . Diabetes Mellitus II Father     Past Surgical History:  Procedure Laterality Date  . cataract surgery Left 07-2013  . I & D EXTREMITY Bilateral 12/31/2018   Procedure: DEBRIDEMENT BILATERAL LEGS, APPLY VAC X 2;  Surgeon: [name], MD;  Location: [facility];  Service: Orthopedics;  Laterality:  Bilateral;  . I & D EXTREMITY Bilateral 01/02/2019   Procedure: REPEAT DEBRIDEMENT BILATERAL LEGS, APPLY VAC X 2;  Surgeon: [name], MD;  Location: [name];  Service: Orthopedics;  Laterality: Bilateral;  . I & D EXTREMITY Bilateral 07/15/2019   Procedure: IRRIGATION AND DEBRIDEMENT VENOUS STASIS INSUFFICIENCY ULCERATIONS BILATERAL LOWER EXTREMITIES;  Surgeon: [name], MD;  Location: [facility];  Service: Orthopedics;  Laterality: Bilateral;  . I & D EXTREMITY Bilateral 08/23/2019   Procedure: DEBRIDEMENT EXTREMITY;  Surgeon: [name], MD;  Location: [facility];  Service: Orthopedics;  Laterality: Bilateral;  . ICD IMPLANT N/A 07/05/2017   Procedure: ICD Implant;  Surgeon: Constance Haw, MD;  Location: [facility];  Service: Cardiovascular;  Laterality: N/A;  . LEFT HEART CATH AND CORONARY ANGIOGRAPHY N/A 07/03/2017   Procedure: Left Heart Cath and Coronary Angiography;  Surgeon: Belva Crome, MD;  Location: [facility];  Service: Cardiovascular;  Laterality: N/A;  . SKIN SPLIT GRAFT Bilateral 01/07/2019   Procedure: SPLIT THICKNESS SKIN GRAFT BILATERAL LEGS, APPLY VAC;  Surgeon: [name], MD;  Location: [facility];  Service: Orthopedics;  Laterality: Bilateral;  . SKIN SPLIT GRAFT  Bilateral 07/17/2019   Procedure: REPEAT IRRIGATION AND DEBRIDEMENT BILATERAL LOWER EXTREMITIES, SKIN GRAFT;  Surgeon: [name], MD;  Location: [facility];  Service: Orthopedics;  Laterality: Bilateral;  . SKIN SPLIT GRAFT Bilateral 08/26/2019   Procedure: SKIN GRAFT SPLIT THICKNESS BILATERAL LEGS, APPLY WOUND VAC;  Surgeon: [name], MD;  Location: [facility];  Service: Orthopedics;  Laterality: Bilateral;   Social History   Occupational History  . Not on file  Tobacco Use  . Smoking status: Never Smoker  . Smokeless tobacco: Never Used  Substance and Sexual Activity  . Alcohol use: No  . Drug use: No  . Sexual activity: Not Currently

## 2020-01-20 DIAGNOSIS — T8149XD Infection following a procedure, other surgical site, subsequent encounter: Secondary | ICD-10-CM | POA: Diagnosis not present

## 2020-01-20 DIAGNOSIS — B964 Proteus (mirabilis) (morganii) as the cause of diseases classified elsewhere: Secondary | ICD-10-CM | POA: Diagnosis not present

## 2020-01-20 DIAGNOSIS — I96 Gangrene, not elsewhere classified: Secondary | ICD-10-CM | POA: Diagnosis not present

## 2020-01-20 DIAGNOSIS — B965 Pseudomonas (aeruginosa) (mallei) (pseudomallei) as the cause of diseases classified elsewhere: Secondary | ICD-10-CM | POA: Diagnosis not present

## 2020-01-20 DIAGNOSIS — I872 Venous insufficiency (chronic) (peripheral): Secondary | ICD-10-CM | POA: Diagnosis not present

## 2020-01-20 DIAGNOSIS — T8131XD Disruption of external operation (surgical) wound, not elsewhere classified, subsequent encounter: Secondary | ICD-10-CM | POA: Diagnosis not present

## 2020-01-25 ENCOUNTER — Ambulatory Visit (INDEPENDENT_AMBULATORY_CARE_PROVIDER_SITE_OTHER): Payer: Medicare Other | Admitting: *Deleted

## 2020-01-25 DIAGNOSIS — I428 Other cardiomyopathies: Secondary | ICD-10-CM

## 2020-01-25 LAB — CUP PACEART REMOTE DEVICE CHECK
Battery Remaining Longevity: 67 mo
Battery Remaining Percentage: 66 %
Battery Voltage: 2.96 V
Brady Statistic RV Percent Paced: 1 %
Date Time Interrogation Session: 20210131144406
HighPow Impedance: 72 Ohm
HighPow Impedance: 72 Ohm
Implantable Lead Implant Date: 20180713
Implantable Lead Location: 753860
Implantable Pulse Generator Implant Date: 20180713
Lead Channel Impedance Value: 360 Ohm
Lead Channel Pacing Threshold Amplitude: 0.75 V
Lead Channel Pacing Threshold Pulse Width: 0.5 ms
Lead Channel Sensing Intrinsic Amplitude: 6 mV
Lead Channel Setting Pacing Amplitude: 2.5 V
Lead Channel Setting Pacing Pulse Width: 0.5 ms
Lead Channel Setting Sensing Sensitivity: 0.5 mV
Pulse Gen Serial Number: 7302172

## 2020-01-25 NOTE — Progress Notes (Signed)
ICD Remote  

## 2020-02-04 ENCOUNTER — Other Ambulatory Visit: Payer: Self-pay

## 2020-02-04 ENCOUNTER — Encounter: Payer: Self-pay | Admitting: Orthopedic Surgery

## 2020-02-04 ENCOUNTER — Ambulatory Visit (INDEPENDENT_AMBULATORY_CARE_PROVIDER_SITE_OTHER): Payer: Medicare Other | Admitting: Orthopedic Surgery

## 2020-02-04 VITALS — Ht 67.0 in | Wt 296.0 lb

## 2020-02-04 DIAGNOSIS — L97919 Non-pressure chronic ulcer of unspecified part of right lower leg with unspecified severity: Secondary | ICD-10-CM | POA: Diagnosis not present

## 2020-02-04 DIAGNOSIS — I87313 Chronic venous hypertension (idiopathic) with ulcer of bilateral lower extremity: Secondary | ICD-10-CM | POA: Diagnosis not present

## 2020-02-04 DIAGNOSIS — L97929 Non-pressure chronic ulcer of unspecified part of left lower leg with unspecified severity: Secondary | ICD-10-CM | POA: Diagnosis not present

## 2020-02-04 NOTE — Progress Notes (Signed)
Office Visit Note   Patient: Kari [name]           Date of Birth: [birth year]           MRN: [id number] Visit Date: 02/04/2020              Requested by: Rutherford Guys, MD [address] [place],  [place] [address] PCP: Rutherford Guys, MD  Chief Complaint  Patient presents with  . Left Leg - Follow-up    Deb BLE STSG   . Right Leg - Follow-up      HPI: Patient is a 59 year old woman with chronic venous insufficiency ulcers both lower extremities.  She is now wearing the medical compression stockings as well as the CircAid nonelastic compression wraps.  Patient feels like she is making good steady progress.  Assessment & Plan: Visit Diagnoses:  1. 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  Continue with the medical compression stocking can continue with the CircAid wraps during the day.  Follow-Up Instructions: Return in about 3 weeks (around 02/25/2020).   Ortho Exam  Patient is alert, oriented, no adenopathy, well-dressed, normal affect, normal respiratory effort. Examination patient has some nonviable tissue around the edges of the ulcer on the left leg.  After informed consent a 10 blade knife was used to debride the skin and soft tissue back to healthy viable granulation tissue there is approximately a centimeter of new epithelialization around the wound edges.  There is small layer of fibrinous exudative tissue.  There is no cellulitis no odor no drainage no signs of infection.  After debridement the ulcer is 11 cm in diameter.  1 mm deep.  Imaging: No results found. No images are attached to the encounter.  Labs: Lab Results  Component Value Date   HGBA1C 5.8 (H) 08/20/2019   HGBA1C 5.6 12/17/2016   HGBA1C 5.5 03/12/2014   REPTSTATUS 08/28/2019 FINAL 08/23/2019   GRAMSTAIN  08/23/2019     MODERATE WBC PRESENT, PREDOMINANTLY PMN FEW GRAM POSITIVE COCCI IN PAIRS IN CLUSTERS RARE GRAM NEGATIVE RODS    CULT  08/23/2019    FEW PSEUDOMONAS AERUGINOSA FEW PROTEUS MIRABILIS FEW STAPHYLOCOCCUS SPECIES (COAGULASE NEGATIVE) NO ANAEROBES ISOLATED Performed at [facility], [place] [address], [place], [place] [address]    LABORGA PSEUDOMONAS AERUGINOSA 08/23/2019   LABORGA PROTEUS MIRABILIS 08/23/2019     Lab Results  Component Value Date   ALBUMIN 3.5 (L) 12/31/2019   ALBUMIN 3.7 (L) 09/28/2019   ALBUMIN 1.9 (L) 08/28/2019   PREALBUMIN 19.7 08/31/2019    Lab Results  Component Value Date   MG 2.4 01/05/2019   MG 2.3 01/02/2019   MG 2.0 12/27/2018   Lab Results  Component Value Date   VD25OH 26.7 (L) 08/22/2019    Lab Results  Component Value Date   PREALBUMIN 19.7 08/31/2019   CBC EXTENDED Latest Ref Rng & Units 08/31/2019 08/29/2019 08/28/2019  WBC 4.0 - 10.5 K/uL 5.5 7.6 10.8(H)  RBC 3.87 - 5.11 MIL/uL 2.85(L) 2.73(L) 2.58(L)  HGB 12.0 - 15.0 g/dL 8.2(L) 7.7(L) 7.4(L)  HCT 36.0 - 46.0 % 27.3(L) 26.9(L) 24.9(L)  PLT 150 - 400 K/uL 237 244 256  NEUTROABS 1.7 - 7.7 K/uL 3.5 - 7.5  LYMPHSABS 0.7 - 4.0 K/uL 1.2 - 2.1     Body  mass index is 46.36 kg/m.  Orders:  No orders of the defined types were placed in this encounter.  No orders of the defined types were placed in this encounter.    Procedures: No procedures performed  Clinical Data: No additional findings.  ROS:  All other systems negative, except as noted in the HPI. Review of Systems  Objective: Vital Signs: Ht 5\' 7"  (1.702 m)   Wt 296 lb (134.3 kg)   LMP 11/21/2011   BMI 46.36 kg/m   Specialty Comments:  No specialty comments available.  PMFS History: Patient Active Problem List   Diagnosis Date Noted  . Multiple open wounds of lower leg   . Moderate protein-calorie malnutrition ([place])   . Non-healing ulcer ([place]) 08/19/2019  . Postoperative pain   . Labile blood pressure    . Nonischemic cardiomyopathy ([place])   . Physical debility 07/20/2019  . 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 H/O skin graft 05/13/2019  . CKD (chronic kidney disease), stage III 01/13/2019  . Infected wound 12/26/2018  . Idiopathic chronic venous hypertension of both lower extremities with ulcer and inflammation ([place])   . History of fall 05/10/2018  . Hyperkalemia 02/21/2018  . Hypotension 02/21/2018  . Sepsis ([place]) 02/21/2018  . Encounter for central line placement   . Mitral regurgitation 10/30/2017  . Acute kidney injury ([place]) 07/21/2017  . Generalized weakness 07/20/2017  . Shock circulatory ([place])   . Acute respiratory failure with hypoxia ([place])   . Pulmonary edema   . Cardiac arrest ([place]) 06/26/2017  . Ventricular fibrillation ([place])   . Acute on chronic diastolic heart failure ([place])   . Abnormal ECG   . Cellulitis 04/02/2017  . AKI (acute kidney injury) ([place]) 12/17/2016  . Cellulitis and abscess of right lower extremity 12/17/2016  . Syncope and collapse 12/17/2016  . Peripheral edema 12/17/2016  . Hypokalemia 12/17/2016  . Anemia of chronic disease 12/17/2016  . Acute diastolic CHF (congestive heart failure) ([place]) 03/14/2014  . CHF (congestive heart failure) ([place]) 03/12/2014  . Sarcoidosis ([place]) 03/12/2014  . Severe sepsis ([place]) 10/17/2013  . ARF (acute renal failure) ([place]) 10/08/2013  . Cellulitis of multiple sites of lower extremity 10/08/2013  . Morbid obesity ([place]) 10/08/2013  . Volume depletion 10/08/2013  . Venous (peripheral) insufficiency 10/28/2012  . Mediastinal lymphadenopathy 10/16/2012  . Pulmonary nodules 10/16/2012  . 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 Chest pain 09/02/2012  . Asthma   . Hypertension   . Depression   . Venous stasis ulcers ([name]) 07/29/2012  . Varicose veins of lower extremities with ulcer ([place]) 07/08/2012  .  Venous ulcer of leg ([place]) 07/08/2012   Past Medical History:  Diagnosis Date  . Arthritis   . Asthma   . CHF (congestive heart failure) ([place]) 03/12/2014   a. EF 40-45% by echo in 06/2017 with normal cors by cath. ICD placed following VT arrest  . Depression   . Headache(784.0)   . History of blood transfusion   . Hyperlipidemia   . Hypertension   . Lung nodules   . Myocardial infarction ([place]) 06/26/2014  . Renal disorder    followed by [facility]  . Sarcoidosis   . Ulcer    recurring, from chronic venous insufficiency    Family History  Problem Relation Age of Onset  . Heart failure Mother   . Diabetes Mellitus II Mother   . Hypertension Mother   .  Cancer Mother        unknown type  . Heart disease Father   . Stroke Father   . Diabetes Mellitus II Father     Past Surgical History:  Procedure Laterality Date  . cataract surgery Left 07-2013  . I & D EXTREMITY Bilateral 12/31/2018   Procedure: DEBRIDEMENT BILATERAL LEGS, APPLY VAC X 2;  Surgeon: [name], MD;  Location: [facility];  Service: Orthopedics;  Laterality: Bilateral;  . I & D EXTREMITY Bilateral 01/02/2019   Procedure: REPEAT DEBRIDEMENT BILATERAL LEGS, APPLY VAC X 2;  Surgeon: [name], MD;  Location: [facility];  Service: Orthopedics;  Laterality: Bilateral;  . I & D EXTREMITY Bilateral 07/15/2019   Procedure: IRRIGATION AND DEBRIDEMENT VENOUS STASIS INSUFFICIENCY ULCERATIONS BILATERAL LOWER EXTREMITIES;  Surgeon: [name], MD;  Location: [facility];  Service: Orthopedics;  Laterality: Bilateral;  . I & D EXTREMITY Bilateral 08/23/2019   Procedure: DEBRIDEMENT EXTREMITY;  Surgeon: [name], MD;  Location: [facility];  Service: Orthopedics;  Laterality: Bilateral;  . ICD IMPLANT N/A 07/05/2017   Procedure: ICD Implant;  Surgeon: Constance Haw, MD;  Location: [facility];  Service: Cardiovascular;  Laterality: N/A;  . LEFT HEART CATH AND CORONARY ANGIOGRAPHY N/A 07/03/2017   Procedure: Left Heart  Cath and Coronary Angiography;  Surgeon: Belva Crome, MD;  Location: [facility];  Service: Cardiovascular;  Laterality: N/A;  . SKIN SPLIT GRAFT Bilateral 01/07/2019   Procedure: SPLIT THICKNESS SKIN GRAFT BILATERAL LEGS, APPLY VAC;  Surgeon: [name], MD;  Location: [facility];  Service: Orthopedics;  Laterality: Bilateral;  . SKIN SPLIT GRAFT Bilateral 07/17/2019   Procedure: REPEAT IRRIGATION AND DEBRIDEMENT BILATERAL LOWER EXTREMITIES, SKIN GRAFT;  Surgeon: [name], MD;  Location: [facility];  Service: Orthopedics;  Laterality: Bilateral;  . SKIN SPLIT GRAFT Bilateral 08/26/2019   Procedure: SKIN GRAFT SPLIT THICKNESS BILATERAL LEGS, APPLY WOUND VAC;  Surgeon: [name], MD;  Location: [facility] [place];  Service: Orthopedics;  Laterality: Bilateral;   Social History   Occupational History  . Not on file  Tobacco Use  . Smoking status: Never Smoker  . Smokeless tobacco: Never Used  Substance and Sexual Activity  . Alcohol use: No  . Drug use: No  . Sexual activity: Not Currently

## 2020-02-08 ENCOUNTER — Emergency Department (HOSPITAL_COMMUNITY)
Admission: EM | Admit: 2020-02-08 | Discharge: 2020-02-08 | Disposition: A | Discharge status: Home / Self Care | Payer: Medicare Other | Attending: Emergency Medicine | Admitting: Emergency Medicine

## 2020-02-08 ENCOUNTER — Other Ambulatory Visit: Payer: Self-pay

## 2020-02-08 ENCOUNTER — Encounter (HOSPITAL_COMMUNITY): Payer: Self-pay | Admitting: Emergency Medicine

## 2020-02-08 ENCOUNTER — Emergency Department (HOSPITAL_COMMUNITY): Payer: Medicare Other

## 2020-02-08 DIAGNOSIS — I491 Atrial premature depolarization: Secondary | ICD-10-CM | POA: Diagnosis not present

## 2020-02-08 DIAGNOSIS — R112 Nausea with vomiting, unspecified: Secondary | ICD-10-CM | POA: Diagnosis not present

## 2020-02-08 DIAGNOSIS — I130 Hypertensive heart and chronic kidney disease with heart failure and stage 1 through stage 4 chronic kidney disease, or unspecified chronic kidney disease: Secondary | ICD-10-CM | POA: Diagnosis not present

## 2020-02-08 DIAGNOSIS — R319 Hematuria, unspecified: Secondary | ICD-10-CM | POA: Diagnosis not present

## 2020-02-08 DIAGNOSIS — R110 Nausea: Secondary | ICD-10-CM | POA: Diagnosis not present

## 2020-02-08 DIAGNOSIS — R1900 Intra-abdominal and pelvic swelling, mass and lump, unspecified site: Secondary | ICD-10-CM | POA: Insufficient documentation

## 2020-02-08 DIAGNOSIS — N183 Chronic kidney disease, stage 3 unspecified: Secondary | ICD-10-CM | POA: Diagnosis not present

## 2020-02-08 DIAGNOSIS — R1111 Vomiting without nausea: Secondary | ICD-10-CM | POA: Diagnosis not present

## 2020-02-08 DIAGNOSIS — I5033 Acute on chronic diastolic (congestive) heart failure: Secondary | ICD-10-CM | POA: Insufficient documentation

## 2020-02-08 DIAGNOSIS — R001 Bradycardia, unspecified: Secondary | ICD-10-CM | POA: Diagnosis not present

## 2020-02-08 DIAGNOSIS — K8020 Calculus of gallbladder without cholecystitis without obstruction: Secondary | ICD-10-CM | POA: Diagnosis not present

## 2020-02-08 DIAGNOSIS — I10 Essential (primary) hypertension: Secondary | ICD-10-CM | POA: Diagnosis not present

## 2020-02-08 LAB — BASIC METABOLIC PANEL
Anion gap: 10 (ref 5–15)
BUN: 17 mg/dL (ref 6–20)
CO2: 26 mmol/L (ref 22–32)
Calcium: 9 mg/dL (ref 8.9–10.3)
Chloride: 102 mmol/L (ref 98–111)
Creatinine, Ser: 1.34 mg/dL — ABNORMAL HIGH (ref 0.44–1.00)
GFR calc Af Amer: 51 mL/min — ABNORMAL LOW (ref >60)
GFR calc non Af Amer: 44 mL/min — ABNORMAL LOW (ref >60)
Glucose, Bld: 125 mg/dL — ABNORMAL HIGH (ref 70–99)
Potassium: 3.4 mmol/L — ABNORMAL LOW (ref 3.5–5.1)
Sodium: 138 mmol/L (ref 135–145)

## 2020-02-08 LAB — URINALYSIS, ROUTINE W REFLEX MICROSCOPIC
Bilirubin Urine: NEGATIVE
Glucose, UA: NEGATIVE mg/dL
Hgb urine dipstick: NEGATIVE
Ketones, ur: 5 mg/dL — AB
Leukocytes,Ua: NEGATIVE
Nitrite: NEGATIVE
Protein, ur: 100 mg/dL — AB
Specific Gravity, Urine: 1.029 (ref 1.005–1.030)
pH: 5 (ref 5.0–8.0)

## 2020-02-08 LAB — CBC
HCT: 45.9 % (ref 36.0–46.0)
Hemoglobin: 14.1 g/dL (ref 12.0–15.0)
MCH: 28.3 pg (ref 26.0–34.0)
MCHC: 30.7 g/dL (ref 30.0–36.0)
MCV: 92.2 fL (ref 80.0–100.0)
Platelets: 354 10*3/uL (ref 150–400)
RBC: 4.98 MIL/uL (ref 3.87–5.11)
RDW: 13.8 % (ref 11.5–15.5)
WBC: 8.5 10*3/uL (ref 4.0–10.5)
nRBC: 0 % (ref 0.0–0.2)

## 2020-02-08 LAB — MAGNESIUM: Magnesium: 2.4 mg/dL (ref 1.7–2.4)

## 2020-02-08 LAB — I-STAT BETA HCG BLOOD, ED (MC, WL, AP ONLY): I-stat hCG, quantitative: <5 m[IU]/mL (ref <5)

## 2020-02-08 LAB — TSH: TSH: 2.31 u[IU]/mL (ref 0.350–4.500)

## 2020-02-08 MED ORDER — ONDANSETRON 4 MG PO TBDP: 4.0000 mg | ORAL_TABLET | Freq: Three times a day (TID) | ORAL | 0 refills | Status: AC | PRN

## 2020-02-08 MED ORDER — ONDANSETRON 4 MG PO TBDP
4.0000 mg | ORAL_TABLET | Freq: Once | ORAL | Status: AC
  Administered 2020-02-08: 23:00:00 4 mg via ORAL
  Filled 2020-02-08: qty 1

## 2020-02-08 NOTE — ED Notes (Signed)
Patient's defibrillator interrogated 2nd time and transmitted, EDP notified that result is still pending .

## 2020-02-08 NOTE — ED Triage Notes (Signed)
Pt arrives via EMS from home with reports of blood in her urine for a day. EMS reports slow and irregular HR. Pt denies any pain. States she feels a little dehydrated.

## 2020-02-08 NOTE — ED Provider Notes (Signed)
Emergency Department Provider Note   I have reviewed the triage vital signs and the nursing notes.   HISTORY  Chief Complaint Hematuria   HPI Kari [name] is a 59 y.o. female with PMH of CHF, AICD ([employer]), HLD, HTN, and chronic venous insufficiency presents to the emergency department for evaluation of blood noted in the urine today.  She denies any dysuria, hesitancy, urgency.  She did have some left-sided back discomfort which was mild to moderate and not present currently.  She has not noticed any heart palpitations, chest pain, shortness of breath but patient ultimately arrived to the ED by EMS and they reported "irregular heart rate" on monitor with them.  Patient states that she does feel somewhat dehydrated.  She has not had vaginal bleeding or discharge.  No blood in the bowel movements or diarrhea.  Past Medical History:  Diagnosis Date  . Arthritis   . Asthma   . CHF (congestive heart failure) ([place]) 03/12/2014   a. EF 40-45% by echo in 06/2017 with normal cors by cath. ICD placed following VT arrest  . Depression   . Headache(784.0)   . History of blood transfusion   . Hyperlipidemia   . Hypertension   . Lung nodules   . Myocardial infarction ([place]) 06/26/2014  . Renal disorder    followed by [facility]  . Sarcoidosis   . Ulcer    recurring, from chronic venous insufficiency    Patient Active Problem List   Diagnosis Date Noted  . Multiple open wounds of lower leg   . Moderate protein-calorie malnutrition ([place])   . Non-healing ulcer ([place]) 08/19/2019  . Postoperative pain   . Labile blood pressure   . Nonischemic cardiomyopathy ([place])   . Physical debility 07/20/2019  . 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 H/O skin graft 05/13/2019  . CKD (chronic kidney disease), stage III 01/13/2019  . Infected wound 12/26/2018  . Idiopathic chronic venous hypertension of both lower extremities with  ulcer and inflammation ([place])   . History of fall 05/10/2018  . Hyperkalemia 02/21/2018  . Hypotension 02/21/2018  . Sepsis ([place]) 02/21/2018  . Encounter for central line placement   . Mitral regurgitation 10/30/2017  . Acute kidney injury ([place]) 07/21/2017  . Generalized weakness 07/20/2017  . Shock circulatory ([place])   . Acute respiratory failure with hypoxia ([place])   . Pulmonary edema   . Cardiac arrest ([place]) 06/26/2017  . Ventricular fibrillation ([place])   . Acute on chronic diastolic heart failure ([place])   . Abnormal ECG   . Cellulitis 04/02/2017  . AKI (acute kidney injury) ([place]) 12/17/2016  . Cellulitis and abscess of right lower extremity 12/17/2016  . Syncope and collapse 12/17/2016  . Peripheral edema 12/17/2016  . Hypokalemia 12/17/2016  . Anemia of chronic disease 12/17/2016  . Acute diastolic CHF (congestive heart failure) ([place]) 03/14/2014  . CHF (congestive heart failure) ([place]) 03/12/2014  . Sarcoidosis ([place]) 03/12/2014  . Severe sepsis ([place]) 10/17/2013  . ARF (acute renal failure) ([place]) 10/08/2013  . Cellulitis of multiple sites of lower extremity 10/08/2013  . Morbid obesity ([place]) 10/08/2013  . Volume depletion 10/08/2013  . Venous (peripheral) insufficiency 10/28/2012  . Mediastinal lymphadenopathy 10/16/2012  . Pulmonary nodules 10/16/2012  . 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 Chest pain 09/02/2012  . Asthma   . Hypertension   . Depression   . Venous stasis ulcers ([place]) 07/29/2012  .  Varicose veins of lower extremities with ulcer ([place]) 07/08/2012  . Venous ulcer of leg ([place]) 07/08/2012    Past Surgical History:  Procedure Laterality Date  . cataract surgery Left 07-2013  . I & D EXTREMITY Bilateral 12/31/2018   Procedure: DEBRIDEMENT BILATERAL LEGS, APPLY VAC X 2;  Surgeon: [name], MD;  Location: [facility];  Service: Orthopedics;  Laterality: Bilateral;  . I & D EXTREMITY Bilateral 01/02/2019    Procedure: REPEAT DEBRIDEMENT BILATERAL LEGS, APPLY VAC X 2;  Surgeon: [name], MD;  Location: [facility];  Service: Orthopedics;  Laterality: Bilateral;  . I & D EXTREMITY Bilateral 07/15/2019   Procedure: IRRIGATION AND DEBRIDEMENT VENOUS STASIS INSUFFICIENCY ULCERATIONS BILATERAL LOWER EXTREMITIES;  Surgeon: [name], MD;  Location: [facility];  Service: Orthopedics;  Laterality: Bilateral;  . I & D EXTREMITY Bilateral 08/23/2019   Procedure: DEBRIDEMENT EXTREMITY;  Surgeon: [name], MD;  Location: [place] [facility] [place];  Service: Orthopedics;  Laterality: Bilateral;  . ICD IMPLANT N/A 07/05/2017   Procedure: ICD Implant;  Surgeon: Constance Haw, MD;  Location: [facility];  Service: Cardiovascular;  Laterality: N/A;  . LEFT HEART CATH AND CORONARY ANGIOGRAPHY N/A 07/03/2017   Procedure: Left Heart Cath and Coronary Angiography;  Surgeon: Belva Crome, MD;  Location: [facility];  Service: Cardiovascular;  Laterality: N/A;  . SKIN SPLIT GRAFT Bilateral 01/07/2019   Procedure: SPLIT THICKNESS SKIN GRAFT BILATERAL LEGS, APPLY VAC;  Surgeon: [name], MD;  Location: [facility];  Service: Orthopedics;  Laterality: Bilateral;  . SKIN SPLIT GRAFT Bilateral 07/17/2019   Procedure: REPEAT IRRIGATION AND DEBRIDEMENT BILATERAL LOWER EXTREMITIES, SKIN GRAFT;  Surgeon: [name], MD;  Location: [facility];  Service: Orthopedics;  Laterality: Bilateral;  . SKIN SPLIT GRAFT Bilateral 08/26/2019   Procedure: SKIN GRAFT SPLIT THICKNESS BILATERAL LEGS, APPLY WOUND VAC;  Surgeon: [name], MD;  Location: [facility];  Service: Orthopedics;  Laterality: Bilateral;    Allergies Tramadol  Family History  Problem Relation Age of Onset  . Heart failure Mother   . Diabetes Mellitus II Mother   . Hypertension Mother   . Cancer Mother        unknown type  . Heart disease Father   . Stroke Father   . Diabetes Mellitus II Father     Social History Social History   Tobacco Use  . Smoking status:  Never Smoker  . Smokeless tobacco: Never Used  Substance Use Topics  . Alcohol use: No  . Drug use: No    Review of Systems  Constitutional: No fever/chills Eyes: No visual changes. ENT: No sore throat. Cardiovascular: Denies chest pain. Respiratory: Denies shortness of breath. Gastrointestinal: Positive left flank pain.  No nausea, no vomiting.  No diarrhea.  No constipation. Genitourinary: Negative for dysuria. Positive mild hematuria.  Musculoskeletal: Negative for back pain. Skin: Negative for rash. Neurological: Negative for headaches, focal weakness or numbness.  10-point ROS otherwise negative.  ____________________________________________   PHYSICAL EXAM:  VITAL SIGNS: ED Triage Vitals  Enc Vitals Group     BP 02/08/20 1520 (!) 131/94     Pulse Rate 02/08/20 1520 82     Resp 02/08/20 1520 16     Temp 02/08/20 1520 98.2 F (36.8 C)     Temp Source 02/08/20 1520 Oral     SpO2 02/08/20 1520 97 %   Constitutional: Alert and oriented. Well appearing and in no acute distress. Eyes: Conjunctivae are  normal.  Head: Atraumatic. Nose: No congestion/rhinnorhea. Mouth/Throat: Mucous membranes are moist.   Neck: No stridor.  Cardiovascular: Normal rate, regular rhythm. Good peripheral circulation. Grossly normal heart sounds.   Respiratory: Normal respiratory effort.  No retractions. Lungs CTAB. Gastrointestinal: Soft and nontender. No distention.  Musculoskeletal: No gross deformities of extremities. Neurologic:  Normal speech and language. No gross focal neurologic deficits are appreciated.  Skin:  Skin is warm, dry and intact. No rash noted.   ____________________________________________   LABS (all labs ordered are listed, but only abnormal results are displayed)  Labs Reviewed  URINALYSIS, ROUTINE W REFLEX MICROSCOPIC - Abnormal; Notable for the following components:      Result Value   Color, Urine AMBER (*)    APPearance CLOUDY (*)    Ketones, ur 5 (*)     Protein, ur 100 (*)    Bacteria, UA RARE (*)    All other components within normal limits  BASIC METABOLIC PANEL - Abnormal; Notable for the following components:   Potassium 3.4 (*)    Glucose, Bld 125 (*)    Creatinine, Ser 1.34 (*)    GFR calc non Af Amer 44 (*)    GFR calc Af Amer 51 (*)    All other components within normal limits  CBC  MAGNESIUM  TSH  I-STAT BETA HCG BLOOD, ED ([facility], [facility], [facility] ONLY)   ____________________________________________  EKG   EKG Interpretation  Date/Time:  Monday February 08 2020 15:14:05 EST Ventricular Rate:  80 PR Interval:  158 QRS Duration: 94 QT Interval:  412 QTC Calculation: 475 R Axis:   5 Text Interpretation: Normal sinus rhythm Frequent PVC Cannot rule out Anterior infarct , age undetermined Abnormal ECG No STEMI Confirmed by Nanda Quinton [id number]) on 02/08/2020 6:56:30 PM       ____________________________________________  RADIOLOGY  CT Renal Stone Study  Result Date: 02/08/2020 CLINICAL DATA:  Blood in urine for 1 day. Abdominal pain beginning this morning. EXAM: CT ABDOMEN AND PELVIS WITHOUT CONTRAST TECHNIQUE: Multidetector CT imaging of the abdomen and pelvis was performed following the standard protocol without IV contrast. COMPARISON:  CT abdomen and pelvis 04/01/2017. FINDINGS: Lower chest: Mild dependent atelectasis. Heart size mildly enlarged. No pericardial pleural effusion. Leads from pacing device are noted. Peribronchial thickening or fullness of the right infra-hilum is partially imaged but unchanged. Hepatobiliary: Small stones are seen in the gallbladder but there is no evidence of cholecystitis. The liver and biliary tree appear normal. Pancreas: Unremarkable. No pancreatic ductal dilatation or surrounding inflammatory changes. Spleen: Normal in size without focal abnormality. Adrenals/Urinary Tract: Adrenal glands are unremarkable. Kidneys are normal, without renal calculi, focal lesion, or hydronephrosis. Bladder is  unremarkable. Stomach/Bowel: There is dilatation is mild dilatation small bowel loops with air-fluid levels present but no transition point is identified. Gas and stool are seen in the colon. The descending colon is almost completely decompressed. Small hiatal hernia is noted. No pneumatosis, portal venous gas or free air. Vascular/Lymphatic: No significant vascular findings are present. No enlarged abdominal or pelvic lymph nodes. Reproductive: The uterus appears normal. As seen on prior examinations, there is a cystic mass in the pelvis which measures 22 cm craniocaudal by 23 cm transverse by 16 cm AP compared to 31 x 26 x 20 cm on the 2018 CT scan. Other: Fat containing umbilical hernia noted. Musculoskeletal: No acute or focal abnormality. IMPRESSION: Negative for urinary tract stone or finding to explain hematuria. Mild dilatation of small bowel without transition point identified suggestive  of ileus. No CT signs of bowel ischemia. Gallstones without CT evidence of acute cholecystitis. 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 It has slightly decreased in size since the patient's 2018 examination. Electronically Signed   By: Inge Rise M.D.   On: 02/08/2020 20:15    ____________________________________________   PROCEDURES  Procedure(s) performed:   Procedures   ____________________________________________   INITIAL IMPRESSION / ASSESSMENT AND PLAN / ED COURSE  Pertinent labs & imaging results that were available during my care of the patient were reviewed by me and considered in my medical decision making (see chart for details).   Patient presents to the ED with report of hematuria.  Some left flank pain but pain is fairly minimal.  Vital signs are within normal limits.  Patient does have frequent PVCs on monitor.  Will interrogate her pacemaker but no specific cardiac or respiratory symptoms.  Plan for CT renal with some flank discomfort to evaluate  for possible nephrolithiasis. No obstruction symptoms.   CT renal. No symptoms to suspect ileus other than mild nausea. No vomiting. No severe or active pain. No concern clinically for SBO. UA and labs reviewed. No ectopy on telemetry. Plan for PCP and Cardiology follow up. Discussed ED return precautions. No knows about her pelvic mass and will f/u with her PCP.  ____________________________________________  FINAL CLINICAL IMPRESSION(S) / ED DIAGNOSES  Final diagnoses:  Hematuria, unspecified type  Nausea  Pelvic mass     MEDICATIONS GIVEN DURING THIS VISIT:  Medications  ondansetron (ZOFRAN-ODT) disintegrating tablet 4 mg (4 mg Oral Given 02/08/20 2329)     NEW OUTPATIENT MEDICATIONS STARTED DURING THIS VISIT:  Discharge Medication List as of 02/08/2020 10:17 PM    START taking these medications   Details  ondansetron (ZOFRAN ODT) 4 MG disintegrating tablet Take 1 tablet (4 mg total) by mouth every 8 (eight) hours as needed for nausea or vomiting., Starting Mon 02/08/2020, Normal        Note:  This document was prepared using Dragon voice recognition software and may include unintentional dictation errors.  Nanda Quinton, MD, [facility]    Antonius Hartlage, [facility], MD 02/09/20 1116

## 2020-02-08 NOTE — Discharge Instructions (Signed)
You were seen in the emergency department today with blood in the urine.  We did not find blood in the urine today or evidence of infection.  Your CT scan shows no acute findings.  If you develop worsening abdominal pain or vomiting you should return to the emergency department.  We continue to see a pelvic mass which was seen previously and is slightly smaller than before.  Please follow with your primary care doctor and take the nausea medication as needed for mild symptoms.  Please follow closely with your cardiologist.

## 2020-02-17 ENCOUNTER — Telehealth: Payer: Self-pay | Admitting: Orthopedic Surgery

## 2020-02-17 NOTE — Telephone Encounter (Signed)
Patient called asked if she can get some gauze and abd pads until she come to her appointment 02/25/2020. Patient said her sister Vaughan Basta can pick them up tomorrow. The number to contact patient is [phone]

## 2020-02-18 NOTE — Telephone Encounter (Signed)
Called and sw pt to advise that some dressing supplies are at the front desk when she has time to pick them up.

## 2020-02-25 ENCOUNTER — Ambulatory Visit: Payer: Medicare Other | Admitting: Orthopedic Surgery

## 2020-03-03 ENCOUNTER — Encounter: Payer: Self-pay | Admitting: Orthopedic Surgery

## 2020-03-03 ENCOUNTER — Other Ambulatory Visit: Payer: Self-pay

## 2020-03-03 ENCOUNTER — Ambulatory Visit (INDEPENDENT_AMBULATORY_CARE_PROVIDER_SITE_OTHER): Payer: Medicare Other | Admitting: Orthopedic Surgery

## 2020-03-03 VITALS — Ht 67.0 in | Wt 296.0 lb

## 2020-03-03 DIAGNOSIS — L97929 Non-pressure chronic ulcer of unspecified part of left lower leg with unspecified severity: Secondary | ICD-10-CM | POA: Diagnosis not present

## 2020-03-03 DIAGNOSIS — L97919 Non-pressure chronic ulcer of unspecified part of right lower leg with unspecified severity: Secondary | ICD-10-CM | POA: Diagnosis not present

## 2020-03-03 DIAGNOSIS — I87333 Chronic venous hypertension (idiopathic) with ulcer and inflammation of bilateral lower extremity: Secondary | ICD-10-CM | POA: Diagnosis not present

## 2020-03-03 MED ORDER — HYDROCODONE-ACETAMINOPHEN 5-325 MG PO TABS: 1.0000 | ORAL_TABLET | Freq: Four times a day (QID) | ORAL | 0 refills | Status: DC | PRN

## 2020-03-03 NOTE — Progress Notes (Signed)
Office Visit Note   Patient: Kari [name]           Date of Birth: 1961/06/21           MRN: [id number] Visit Date: 03/03/2020              Requested by: Rutherford Guys, MD [address] [place],  [place] [address] PCP: Rutherford Guys, MD  Chief Complaint  Patient presents with  . Right Leg - Follow-up  . Left Leg - Follow-up      HPI: Patient is a 59 year old woman who presents in follow-up status post skin graft bilateral lower extremities with chronic venous insufficiency.  Patient has been washing her legs with soap and water using an Ace wrap and gauze.  She feels like the wounds are smaller and has decreased drainage.  Assessment & Plan: Visit Diagnoses:  1. Idiopathic chronic venous hypertension of both lower extremities with ulcer and inflammation (HCC)     Plan: Prescription for Vicodin called in we will remove further staples Iodosorb and 4 x 4 dressings applied recommended that she scrub the wound when she washes with soap and water to remove the fibrinous exudative tissue.  Follow-Up Instructions: Return in about 4 weeks (around 03/31/2020).   Ortho Exam  Patient is alert, oriented, no adenopathy, well-dressed, normal affect, normal respiratory effort. Examination both wounds have 100% fibrinous exudative tissue covering the wounds there is no cellulitis no signs of infection there is scab around the wound edges which is peeling off.  Imaging: No results found. No images are attached to the encounter.  Labs: Lab Results  Component Value Date   HGBA1C 5.8 (H) 08/20/2019   HGBA1C 5.6 12/17/2016   HGBA1C 5.5 03/12/2014   REPTSTATUS 08/28/2019 FINAL 08/23/2019   GRAMSTAIN  08/23/2019    MODERATE WBC PRESENT, PREDOMINANTLY PMN FEW GRAM POSITIVE COCCI IN PAIRS IN CLUSTERS RARE GRAM NEGATIVE RODS    CULT  08/23/2019    FEW PSEUDOMONAS AERUGINOSA FEW PROTEUS MIRABILIS FEW STAPHYLOCOCCUS SPECIES (COAGULASE NEGATIVE) NO ANAEROBES ISOLATED Performed at  [facility], [place] [address], [place], [place] [address]    LABORGA PSEUDOMONAS AERUGINOSA 08/23/2019   LABORGA PROTEUS MIRABILIS 08/23/2019     Lab Results  Component Value Date   ALBUMIN 3.5 (L) 12/31/2019   ALBUMIN 3.7 (L) 09/28/2019   ALBUMIN 1.9 (L) 08/28/2019   PREALBUMIN 19.7 08/31/2019    Lab Results  Component Value Date   MG 2.4 02/08/2020   MG 2.4 01/05/2019   MG 2.3 01/02/2019   Lab Results  Component Value Date   VD25OH 26.7 (L) 08/22/2019    Lab Results  Component Value Date   PREALBUMIN 19.7 08/31/2019   CBC EXTENDED Latest Ref Rng & Units 02/08/2020 08/31/2019 08/29/2019  WBC 4.0 - 10.5 K/uL 8.5 5.5 7.6  RBC 3.87 - 5.11 MIL/uL 4.98 2.85(L) 2.73(L)  HGB 12.0 - 15.0 g/dL 14.1 8.2(L) 7.7(L)  HCT 36.0 - 46.0 % 45.9 27.3(L) 26.9(L)  PLT 150 - 400 K/uL 354 237 244  NEUTROABS 1.7 - 7.7 K/uL - 3.5 -  LYMPHSABS 0.7 - 4.0 K/uL - 1.2 -     Body mass index is 46.36 kg/m.  Orders:  No orders of the defined types were placed in this encounter.  No orders of the defined types were placed in this encounter.    Procedures: No procedures performed  Clinical Data: No additional findings.  ROS:  All other systems negative, except as noted in  the HPI. Review of Systems  Objective: Vital Signs: Ht 5\' 7"  (1.702 m)   Wt 296 lb (134.3 kg)   LMP 11/21/2011   BMI 46.36 kg/m   Specialty Comments:  No specialty comments available.  PMFS History: Patient Active Problem List   Diagnosis Date Noted  . Multiple open wounds of lower leg   . Moderate protein-calorie malnutrition ([place])   . Non-healing ulcer ([place]) 08/19/2019  . Postoperative pain   . Labile blood pressure   . Nonischemic cardiomyopathy ([place])   . Physical debility 07/20/2019  . Idiopathic chronic venous hypertension of lower extremity with ulcer, bilateral ([place]) 07/15/2019  . H/O skin graft 05/13/2019  . CKD (chronic kidney disease), stage III 01/13/2019  . Infected wound 12/26/2018  .  Idiopathic chronic venous hypertension of both lower extremities with ulcer and inflammation ([place])   . History of fall 05/10/2018  . Hyperkalemia 02/21/2018  . Hypotension 02/21/2018  . Sepsis ([place]) 02/21/2018  . Encounter for central line placement   . Mitral regurgitation 10/30/2017  . Acute kidney injury ([place]) 07/21/2017  . Generalized weakness 07/20/2017  . Shock circulatory ([place])   . Acute respiratory failure with hypoxia ([place])   . Pulmonary edema   . Cardiac arrest ([place]) 06/26/2017  . Ventricular fibrillation ([place])   . Acute on chronic diastolic heart failure ([place])   . Abnormal ECG   . Cellulitis 04/02/2017  . AKI (acute kidney injury) ([place]) 12/17/2016  . Cellulitis and abscess of right lower extremity 12/17/2016  . Syncope and collapse 12/17/2016  . Peripheral edema 12/17/2016  . Hypokalemia 12/17/2016  . Anemia of chronic disease 12/17/2016  . Acute diastolic CHF (congestive heart failure) ([place]) 03/14/2014  . CHF (congestive heart failure) ([place]) 03/12/2014  . Sarcoidosis ([place]) 03/12/2014  . Severe sepsis ([place]) 10/17/2013  . ARF (acute renal failure) ([place]) 10/08/2013  . Cellulitis of multiple sites of lower extremity 10/08/2013  . Morbid obesity ([place]) 10/08/2013  . Volume depletion 10/08/2013  . Venous (peripheral) insufficiency 10/28/2012  . Mediastinal lymphadenopathy 10/16/2012  . Pulmonary nodules 10/16/2012  . 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 Chest pain 09/02/2012  . Asthma   . Hypertension   . Depression   . Venous stasis ulcers ([place]) 07/29/2012  . Varicose veins of lower extremities with ulcer ([place]) 07/08/2012  . Venous ulcer of leg ([place]) 07/08/2012   Past Medical History:  Diagnosis Date  . Arthritis   . Asthma   . CHF (congestive heart failure) ([place]) 03/12/2014   a. EF 40-45% by echo in 06/2017 with normal cors by cath. ICD placed following VT arrest  . Depression   . Headache(784.0)   .  History of blood transfusion   . Hyperlipidemia   . Hypertension   . Lung nodules   . Myocardial infarction ([place]) 06/26/2014  . Renal disorder    followed by [facility]  . Sarcoidosis   . Ulcer    recurring, from chronic venous insufficiency    Family History  Problem Relation Age of Onset  . Heart failure Mother   . Diabetes Mellitus II Mother   . Hypertension Mother   . Cancer Mother        unknown type  . Heart disease Father   . Stroke Father   . Diabetes Mellitus II Father     Past Surgical History:  Procedure Laterality Date  . cataract surgery Left 07-2013  . I & D EXTREMITY Bilateral 12/31/2018  Procedure: DEBRIDEMENT BILATERAL LEGS, APPLY VAC X 2;  Surgeon: [name], MD;  Location: [facility];  Service: Orthopedics;  Laterality: Bilateral;  . I & D EXTREMITY Bilateral 01/02/2019   Procedure: REPEAT DEBRIDEMENT BILATERAL LEGS, APPLY VAC X 2;  Surgeon: [name], MD;  Location: [facility];  Service: Orthopedics;  Laterality: Bilateral;  . I & D EXTREMITY Bilateral 07/15/2019   Procedure: IRRIGATION AND DEBRIDEMENT VENOUS STASIS INSUFFICIENCY ULCERATIONS BILATERAL LOWER EXTREMITIES;  Surgeon: [name], MD;  Location: [facility];  Service: Orthopedics;  Laterality: Bilateral;  . I & D EXTREMITY Bilateral 08/23/2019   Procedure: DEBRIDEMENT EXTREMITY;  Surgeon: [name], MD;  Location: [facility];  Service: Orthopedics;  Laterality: Bilateral;  . ICD IMPLANT N/A 07/05/2017   Procedure: ICD Implant;  Surgeon: Constance Haw, MD;  Location: [facility];  Service: Cardiovascular;  Laterality: N/A;  . LEFT HEART CATH AND CORONARY ANGIOGRAPHY N/A 07/03/2017   Procedure: Left Heart Cath and Coronary Angiography;  Surgeon: Belva Crome, MD;  Location: [facility];  Service: Cardiovascular;  Laterality: N/A;  . SKIN SPLIT GRAFT Bilateral 01/07/2019   Procedure: SPLIT THICKNESS SKIN GRAFT BILATERAL LEGS, APPLY VAC;  Surgeon: [name], MD;  Location: [facility];  Service: Orthopedics;  Laterality: Bilateral;  . SKIN SPLIT GRAFT Bilateral 07/17/2019   Procedure: REPEAT IRRIGATION AND DEBRIDEMENT BILATERAL LOWER EXTREMITIES, SKIN GRAFT;  Surgeon: [name], MD;  Location: [facility];  Service: Orthopedics;  Laterality: Bilateral;  . SKIN SPLIT GRAFT Bilateral 08/26/2019   Procedure: SKIN GRAFT SPLIT THICKNESS BILATERAL LEGS, APPLY WOUND VAC;  Surgeon: [name], MD;  Location: [facility];  Service: Orthopedics;  Laterality: Bilateral;   Social History   Occupational History  . Not on file  Tobacco Use  . Smoking status: Never Smoker  . Smokeless tobacco: Never Used  Substance and Sexual Activity  . Alcohol use: No  . Drug use: No  . Sexual activity: Not Currently

## 2020-03-13 NOTE — Progress Notes (Deleted)
Cardiology Office Note   Date:  03/13/2020   ID:  Kari [name], Kari [name] Kari [name], MRN [id number]  PCP:  Rutherford Guys, MD  Cardiologist:   Dorris Carnes, MD       History of Present Illness: Kari [name] is a 59 y.o. female with a history of NICM, VF arrest in 2018   LVEF at time 40 to 45%  Cath showed normal coronary arteries.  She underwetn ICD implant   Also has hx of HTN and sarcoid   I saw her as a televisit last summer        No outpatient medications have been marked as taking for the 03/14/20 encounter (Appointment) with Fay Records, MD.     Allergies:   Tramadol   Past Medical History:  Diagnosis Date  . Arthritis   . Asthma   . CHF (congestive heart failure) ([place]) 03/12/2014   a. EF 40-45% by echo in 06/2017 with normal cors by cath. ICD placed following VT arrest  . Depression   . Headache(784.0)   . History of blood transfusion   . Hyperlipidemia   . Hypertension   . Lung nodules   . Myocardial infarction ([place]) 06/26/2014  . Renal disorder    followed by [facility]  . Sarcoidosis   . Ulcer    recurring, from chronic venous insufficiency    Past Surgical History:  Procedure Laterality Date  . cataract surgery Left 07-2013  . I & D EXTREMITY Bilateral 12/31/2018   Procedure: DEBRIDEMENT BILATERAL LEGS, APPLY VAC X 2;  Surgeon: [name], MD;  Location: [facility];  Service: Orthopedics;  Laterality: Bilateral;  . I & D EXTREMITY Bilateral 01/02/2019   Procedure: REPEAT DEBRIDEMENT BILATERAL LEGS, APPLY VAC X 2;  Surgeon: [name], MD;  Location: [facility];  Service: Orthopedics;  Laterality: Bilateral;  . I & D EXTREMITY Bilateral 07/15/2019   Procedure: IRRIGATION AND DEBRIDEMENT VENOUS STASIS INSUFFICIENCY ULCERATIONS BILATERAL LOWER EXTREMITIES;  Surgeon: [name], MD;  Location: [facility];  Service: Orthopedics;  Laterality: Bilateral;  . I & D EXTREMITY Bilateral 08/23/2019   Procedure: DEBRIDEMENT EXTREMITY;  Surgeon: [name], MD;  Location: [facility];  Service: Orthopedics;  Laterality: Bilateral;  . ICD IMPLANT N/A 07/05/2017   Procedure: ICD Implant;  Surgeon: Constance Haw, MD;  Location: [facility];  Service: Cardiovascular;  Laterality: N/A;  . LEFT HEART CATH AND CORONARY ANGIOGRAPHY N/A 07/03/2017   Procedure: Left Heart Cath and Coronary Angiography;  Surgeon: Belva Crome, MD;  Location: [facility];  Service: Cardiovascular;  Laterality: N/A;  . SKIN SPLIT GRAFT Bilateral 01/07/2019   Procedure: SPLIT THICKNESS SKIN GRAFT BILATERAL LEGS, APPLY VAC;  Surgeon: [name], MD;  Location: [facility];  Service: Orthopedics;  Laterality: Bilateral;  . SKIN SPLIT GRAFT Bilateral 07/17/2019   Procedure: REPEAT IRRIGATION AND DEBRIDEMENT BILATERAL LOWER EXTREMITIES, SKIN GRAFT;  Surgeon: [name], MD;  Location: [facility];  Service: Orthopedics;  Laterality: Bilateral;  . SKIN SPLIT GRAFT Bilateral 08/26/2019   Procedure: SKIN GRAFT SPLIT THICKNESS BILATERAL LEGS, APPLY WOUND VAC;  Surgeon: [name], MD;  Location: [facility];  Service: Orthopedics;  Laterality: Bilateral;     Social History:  The patient  reports that she has never smoked. She has never used smokeless tobacco. She reports that she does not drink alcohol or use drugs.   Family History:  The patient's family history includes  Cancer in her mother; Diabetes Mellitus II in her father and mother; Heart disease in her father; Heart failure in her mother; Hypertension in her mother; Stroke in her father.    ROS:  Please see the history of present illness. All other systems are reviewed and  Negative to the above problem except as noted.    PHYSICAL EXAM: VS:  LMP 11/21/2011   GEN: Well nourished, well developed, in no acute distress  HEENT: normal  Neck: no JVD, carotid bruits, or masses Cardiac: RRR; no murmurs, rubs, or gallops,no edema  Respiratory:  clear to auscultation bilaterally, normal work of breathing GI: soft,  nontender, nondistended, + BS  No hepatomegaly  MS: no deformity Moving all extremities   Skin: warm and dry, no rash Neuro:  Strength and sensation are intact Psych: euthymic mood, full affect   EKG:  EKG is ordered today.   Lipid Panel    Component Value Date/Time   CHOL 198 12/31/2019 1044   TRIG 112 12/31/2019 1044   HDL 47 12/31/2019 1044   CHOLHDL 4.2 12/31/2019 1044   CHOLHDL 2.5 06/21/2012 0950   VLDL 12 06/21/2012 0950   LDLCALC 131 (H) 12/31/2019 1044      Wt Readings from Last 3 Encounters:  03/03/20 296 lb (134.3 kg)  02/04/20 296 lb (134.3 kg)  01/14/20 296 lb (134.3 kg)      ASSESSMENT AND PLAN:     Current medicines are reviewed at length with the patient today.  The patient does not have concerns regarding medicines.  Signed, Dorris Carnes, MD  03/13/2020 9:48 PM    [facility] [address], [place], [place]  [address] Phone: [phone]; Fax: [phone]

## 2020-03-14 ENCOUNTER — Ambulatory Visit: Payer: Medicare Other | Admitting: Internal Medicine

## 2020-03-24 ENCOUNTER — Ambulatory Visit: Payer: Medicare Other | Attending: Family

## 2020-03-24 DIAGNOSIS — Z23 Encounter for immunization: Secondary | ICD-10-CM

## 2020-03-24 NOTE — Progress Notes (Signed)
   Covid-19 Vaccination Clinic  Name:  DARIAH MCSORLEY    MRN: [id number] DOB: 08-01-1961  03/24/2020  Ms. Mangano was observed post Covid-19 immunization for 15 minutes without incident. She was provided with Vaccine Information Sheet and instruction to access the V-Safe system.   Ms. Scotti was instructed to call 911 with any severe reactions post vaccine: Marland Kitchen Difficulty breathing  . Swelling of face and throat  . A fast heartbeat  . A bad rash all over body  . Dizziness and weakness   Immunizations Administered    Name Date Dose VIS Date Route   Moderna COVID-19 Vaccine 03/24/2020 10:06 AM 0.5 mL 11/24/2019 Intramuscular   Manufacturer: Moderna   Lot: [id number]   [place]: 36144-315-40

## 2020-03-28 ENCOUNTER — Ambulatory Visit: Payer: Medicare Other | Admitting: Family Medicine

## 2020-03-29 ENCOUNTER — Encounter: Payer: Self-pay | Admitting: Family Medicine

## 2020-03-31 ENCOUNTER — Other Ambulatory Visit: Payer: Self-pay

## 2020-03-31 ENCOUNTER — Encounter: Payer: Self-pay | Admitting: Orthopedic Surgery

## 2020-03-31 ENCOUNTER — Ambulatory Visit (INDEPENDENT_AMBULATORY_CARE_PROVIDER_SITE_OTHER): Payer: Medicare Other | Admitting: Orthopedic Surgery

## 2020-03-31 VITALS — Ht 67.0 in | Wt 296.0 lb

## 2020-03-31 DIAGNOSIS — I87333 Chronic venous hypertension (idiopathic) with ulcer and inflammation of bilateral lower extremity: Secondary | ICD-10-CM

## 2020-03-31 DIAGNOSIS — L97929 Non-pressure chronic ulcer of unspecified part of left lower leg with unspecified severity: Secondary | ICD-10-CM | POA: Diagnosis not present

## 2020-03-31 DIAGNOSIS — L97919 Non-pressure chronic ulcer of unspecified part of right lower leg with unspecified severity: Secondary | ICD-10-CM | POA: Diagnosis not present

## 2020-03-31 NOTE — Progress Notes (Signed)
Office Visit Note   Patient: Kari [name]           Date of Birth: 27-Feb-1961           MRN: [id number] Visit Date: 03/31/2020              Requested by: Rutherford Guys, MD [address] [place],  [place] [address] PCP: Rutherford Guys, MD  Chief Complaint  Patient presents with  . Left Leg - Follow-up  . Right Leg - Follow-up      HPI: Patient is a 59 year old woman who presents in follow-up for venous ulceration bilateral lower extremities lateral aspect.  Patient states that she has been having increased swelling due to walking in the store yesterday without being able to use a electric cart.  Patient states she does try to walk on the treadmill she states her legs are swollen to the point she cannot get her socks on.  Assessment & Plan: Visit Diagnoses:  1. Idiopathic chronic venous hypertension of both lower extremities with ulcer and inflammation ([place])     Plan: We will place her in a 4-layer compression wrap for both lower extremities continue with exercising continue with elevation.  Follow-Up Instructions: Return in about 1 week (around 04/07/2020).   Ortho Exam  Patient is alert, oriented, no adenopathy, well-dressed, normal affect, normal respiratory effort. Examination patient has significant swelling and edema in both lower extremities.  The ulcers on the medial aspect of her legs have completely healed the lateral ulcers have approximately 75% fibrinous exudative tissue there is no cellulitis no odor no drainage no signs of infection.  Imaging: No results found. No images are attached to the encounter.  Labs: Lab Results  Component Value Date   HGBA1C 5.8 (H) 08/20/2019   HGBA1C 5.6 12/17/2016   HGBA1C 5.5 03/12/2014   REPTSTATUS 08/28/2019 FINAL 08/23/2019   GRAMSTAIN  08/23/2019    MODERATE WBC PRESENT, PREDOMINANTLY PMN FEW GRAM POSITIVE COCCI IN PAIRS IN CLUSTERS RARE GRAM NEGATIVE RODS    CULT  08/23/2019    FEW PSEUDOMONAS AERUGINOSA FEW  PROTEUS MIRABILIS FEW STAPHYLOCOCCUS SPECIES (COAGULASE NEGATIVE) NO ANAEROBES ISOLATED Performed at [facility], [place] [address], [place], [place] [address]    LABORGA PSEUDOMONAS AERUGINOSA 08/23/2019   LABORGA PROTEUS MIRABILIS 08/23/2019     Lab Results  Component Value Date   ALBUMIN 3.5 (L) 12/31/2019   ALBUMIN 3.7 (L) 09/28/2019   ALBUMIN 1.9 (L) 08/28/2019   PREALBUMIN 19.7 08/31/2019    Lab Results  Component Value Date   MG 2.4 02/08/2020   MG 2.4 01/05/2019   MG 2.3 01/02/2019   Lab Results  Component Value Date   VD25OH 26.7 (L) 08/22/2019    Lab Results  Component Value Date   PREALBUMIN 19.7 08/31/2019   CBC EXTENDED Latest Ref Rng & Units 02/08/2020 08/31/2019 08/29/2019  WBC 4.0 - 10.5 K/uL 8.5 5.5 7.6  RBC 3.87 - 5.11 MIL/uL 4.98 2.85(L) 2.73(L)  HGB 12.0 - 15.0 g/dL 14.1 8.2(L) 7.7(L)  HCT 36.0 - 46.0 % 45.9 27.3(L) 26.9(L)  PLT 150 - 400 K/uL 354 237 244  NEUTROABS 1.7 - 7.7 K/uL - 3.5 -  LYMPHSABS 0.7 - 4.0 K/uL - 1.2 -     Body mass index is 46.36 kg/m.  Orders:  No orders of the defined types were placed in this encounter.  No orders of the defined types were placed in this encounter.    Procedures: No procedures performed  Clinical Data: No additional findings.  ROS:  All other systems negative, except as noted in the HPI. Review of Systems  Objective: Vital Signs: Ht 5\' 7"  (1.702 m)   Wt 296 lb (134.3 kg)   LMP 11/21/2011   BMI 46.36 kg/m   Specialty Comments:  No specialty comments available.  PMFS History: Patient Active Problem List   Diagnosis Date Noted  . Multiple open wounds of lower leg   . Moderate protein-calorie malnutrition ([place])   . Non-healing ulcer ([place]) 08/19/2019  . Postoperative pain   . Labile blood pressure   . Nonischemic cardiomyopathy ([place])   . Physical debility 07/20/2019  . Idiopathic chronic venous hypertension of lower extremity with ulcer, bilateral ([place]) 07/15/2019  . H/O skin  graft 05/13/2019  . CKD (chronic kidney disease), stage III 01/13/2019  . Infected wound 12/26/2018  . Idiopathic chronic venous hypertension of both lower extremities with ulcer and inflammation ([place])   . History of fall 05/10/2018  . Hyperkalemia 02/21/2018  . Hypotension 02/21/2018  . Sepsis ([place]) 02/21/2018  . Encounter for central line placement   . Mitral regurgitation 10/30/2017  . Acute kidney injury ([place]) 07/21/2017  . Generalized weakness 07/20/2017  . Shock circulatory ([place])   . Acute respiratory failure with hypoxia ([place])   . Pulmonary edema   . Cardiac arrest ([place]) 06/26/2017  . Ventricular fibrillation ([place])   . Acute on chronic diastolic heart failure ([place])   . Abnormal ECG   . Cellulitis 04/02/2017  . AKI (acute kidney injury) ([place]) 12/17/2016  . Cellulitis and abscess of right lower extremity 12/17/2016  . Syncope and collapse 12/17/2016  . Peripheral edema 12/17/2016  . Hypokalemia 12/17/2016  . Anemia of chronic disease 12/17/2016  . Acute diastolic CHF (congestive heart failure) ([place]) 03/14/2014  . CHF (congestive heart failure) ([place]) 03/12/2014  . Sarcoidosis ([place]) 03/12/2014  . Severe sepsis ([place]) 10/17/2013  . ARF (acute renal failure) ([place]) 10/08/2013  . Cellulitis of multiple sites of lower extremity 10/08/2013  . Morbid obesity ([place]) 10/08/2013  . Volume depletion 10/08/2013  . Venous (peripheral) insufficiency 10/28/2012  . Mediastinal lymphadenopathy 10/16/2012  . Pulmonary nodules 10/16/2012  . 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 Chest pain 09/02/2012  . Asthma   . Hypertension   . Depression   . Venous stasis ulcers ([place]) 07/29/2012  . Varicose veins of lower extremities with ulcer ([place]) 07/08/2012  . Venous ulcer of leg ([place]) 07/08/2012   Past Medical History:  Diagnosis Date  . Arthritis   . Asthma   . CHF (congestive heart failure) ([place]) 03/12/2014   a. EF 40-45% by echo in  06/2017 with normal cors by cath. ICD placed following VT arrest  . Depression   . Headache(784.0)   . History of blood transfusion   . Hyperlipidemia   . Hypertension   . Lung nodules   . Myocardial infarction ([place]) 06/26/2014  . Renal disorder    followed by [facility]  . Sarcoidosis   . Ulcer    recurring, from chronic venous insufficiency    Family History  Problem Relation Age of Onset  . Heart failure Mother   . Diabetes Mellitus II Mother   . Hypertension Mother   . Cancer Mother        unknown type  . Heart disease Father   . Stroke Father   . Diabetes Mellitus II Father     Past Surgical History:  Procedure Laterality Date  .  cataract surgery Left 07-2013  . I & D EXTREMITY Bilateral 12/31/2018   Procedure: DEBRIDEMENT BILATERAL LEGS, APPLY VAC X 2;  Surgeon: [name], MD;  Location: [facility];  Service: Orthopedics;  Laterality: Bilateral;  . I & D EXTREMITY Bilateral 01/02/2019   Procedure: REPEAT DEBRIDEMENT BILATERAL LEGS, APPLY VAC X 2;  Surgeon: [name], MD;  Location: [facility];  Service: Orthopedics;  Laterality: Bilateral;  . I & D EXTREMITY Bilateral 07/15/2019   Procedure: IRRIGATION AND DEBRIDEMENT VENOUS STASIS INSUFFICIENCY ULCERATIONS BILATERAL LOWER EXTREMITIES;  Surgeon: [name], MD;  Location: [facility];  Service: Orthopedics;  Laterality: Bilateral;  . I & D EXTREMITY Bilateral 08/23/2019   Procedure: DEBRIDEMENT EXTREMITY;  Surgeon: [name], MD;  Location: [facility];  Service: Orthopedics;  Laterality: Bilateral;  . ICD IMPLANT N/A 07/05/2017   Procedure: ICD Implant;  Surgeon: Constance Haw, MD;  Location: [facility];  Service: Cardiovascular;  Laterality: N/A;  . LEFT HEART CATH AND CORONARY ANGIOGRAPHY N/A 07/03/2017   Procedure: Left Heart Cath and Coronary Angiography;  Surgeon: Belva Crome, MD;  Location: [facility];  Service: Cardiovascular;  Laterality: N/A;  . SKIN SPLIT GRAFT Bilateral 01/07/2019    Procedure: SPLIT THICKNESS SKIN GRAFT BILATERAL LEGS, APPLY VAC;  Surgeon: [name], MD;  Location: [facility];  Service: Orthopedics;  Laterality: Bilateral;  . SKIN SPLIT GRAFT Bilateral 07/17/2019   Procedure: REPEAT IRRIGATION AND DEBRIDEMENT BILATERAL LOWER EXTREMITIES, SKIN GRAFT;  Surgeon: [name], MD;  Location: [facility];  Service: Orthopedics;  Laterality: Bilateral;  . SKIN SPLIT GRAFT Bilateral 08/26/2019   Procedure: SKIN GRAFT SPLIT THICKNESS BILATERAL LEGS, APPLY WOUND VAC;  Surgeon: [name], MD;  Location: [facility];  Service: Orthopedics;  Laterality: Bilateral;   Social History   Occupational History  . Not on file  Tobacco Use  . Smoking status: Never Smoker  . Smokeless tobacco: Never Used  Substance and Sexual Activity  . Alcohol use: No  . Drug use: No  . Sexual activity: Not Currently

## 2020-04-07 ENCOUNTER — Encounter: Payer: Self-pay | Admitting: Physician Assistant

## 2020-04-07 ENCOUNTER — Other Ambulatory Visit: Payer: Self-pay

## 2020-04-07 ENCOUNTER — Ambulatory Visit (INDEPENDENT_AMBULATORY_CARE_PROVIDER_SITE_OTHER): Payer: Medicare Other | Admitting: Orthopedic Surgery

## 2020-04-07 VITALS — Ht 67.0 in | Wt 296.0 lb

## 2020-04-07 DIAGNOSIS — L97919 Non-pressure chronic ulcer of unspecified part of right lower leg with unspecified severity: Secondary | ICD-10-CM

## 2020-04-07 DIAGNOSIS — L97929 Non-pressure chronic ulcer of unspecified part of left lower leg with unspecified severity: Secondary | ICD-10-CM

## 2020-04-07 DIAGNOSIS — I87333 Chronic venous hypertension (idiopathic) with ulcer and inflammation of bilateral lower extremity: Secondary | ICD-10-CM | POA: Diagnosis not present

## 2020-04-07 NOTE — Progress Notes (Signed)
Office Visit Note   Patient: Kari [name]           Date of Birth: [birth year]           MRN: [id number] Visit Date: 04/07/2020              Requested by: Rutherford Guys, MD [address] [place],  [place] [address] PCP: Rutherford Guys, MD  Chief Complaint  Patient presents with  . Right Leg - Follow-up  . Left Leg - Follow-up      HPI: Patient is a 59 year old woman with venous insufficiency bilateral lower extremity status post split-thickness skin graft of both legs.  Patient has been recently treated with Dynaflex compression wraps.  Patient states she had to remove the wraps over the weekend due to the pain and swelling.  She is currently using Ace wraps and dry dressing.   Assessment & Plan: Visit Diagnoses:  1. Idiopathic chronic venous hypertension of both lower extremities with ulcer and inflammation (HCC)     Plan: Recommended that she be advanced to compression socks she states she will do this in 2 weeks.  We will have her use an Ace wrap and 4 x 4 dressings and change this daily.  Follow-Up Instructions: Return in about 2 weeks (around 04/21/2020).   Ortho Exam  Patient is alert, oriented, no adenopathy, well-dressed, normal affect, normal respiratory effort. Examination patient has approximately 50% healthy granulation tissue in the wound beds on both legs with some small amount of fibrinous tissue there is no cellulitis no odor no drainage no signs of infection.  Imaging: No results found. No images are attached to the encounter.  Labs: Lab Results  Component Value Date   HGBA1C 5.8 (H) 08/20/2019   HGBA1C 5.6 12/17/2016   HGBA1C 5.5 03/12/2014   REPTSTATUS 08/28/2019 FINAL 08/23/2019   GRAMSTAIN  08/23/2019    MODERATE WBC PRESENT, PREDOMINANTLY PMN FEW GRAM POSITIVE COCCI IN PAIRS IN CLUSTERS RARE GRAM NEGATIVE RODS    CULT  08/23/2019    FEW PSEUDOMONAS AERUGINOSA FEW PROTEUS MIRABILIS FEW STAPHYLOCOCCUS SPECIES (COAGULASE NEGATIVE) NO  ANAEROBES ISOLATED Performed at [facility], [place] [address], [place], [place] [address]    LABORGA PSEUDOMONAS AERUGINOSA 08/23/2019   LABORGA PROTEUS MIRABILIS 08/23/2019     Lab Results  Component Value Date   ALBUMIN 3.5 (L) 12/31/2019   ALBUMIN 3.7 (L) 09/28/2019   ALBUMIN 1.9 (L) 08/28/2019   PREALBUMIN 19.7 08/31/2019    Lab Results  Component Value Date   MG 2.4 02/08/2020   MG 2.4 01/05/2019   MG 2.3 01/02/2019   Lab Results  Component Value Date   VD25OH 26.7 (L) 08/22/2019    Lab Results  Component Value Date   PREALBUMIN 19.7 08/31/2019   CBC EXTENDED Latest Ref Rng & Units 02/08/2020 08/31/2019 08/29/2019  WBC 4.0 - 10.5 K/uL 8.5 5.5 7.6  RBC 3.87 - 5.11 MIL/uL 4.98 2.85(L) 2.73(L)  HGB 12.0 - 15.0 g/dL 14.1 8.2(L) 7.7(L)  HCT 36.0 - 46.0 % 45.9 27.3(L) 26.9(L)  PLT 150 - 400 K/uL 354 237 244  NEUTROABS 1.7 - 7.7 K/uL - 3.5 -  LYMPHSABS 0.7 - 4.0 K/uL - 1.2 -     Body mass index is 46.36 kg/m.  Orders:  No orders of the defined types were placed in this encounter.  No orders of the defined types were placed in this encounter.    Procedures: No procedures performed  Clinical Data: No additional  findings.  ROS:  All other systems negative, except as noted in the HPI. Review of Systems  Objective: Vital Signs: Ht 5\' 7"  (1.702 m)   Wt 296 lb (134.3 kg)   LMP 11/21/2011   BMI 46.36 kg/m   Specialty Comments:  No specialty comments available.  PMFS History: Patient Active Problem List   Diagnosis Date Noted  . Multiple open wounds of lower leg   . Moderate protein-calorie malnutrition ([place])   . Non-healing ulcer ([place]) 08/19/2019  . Postoperative pain   . Labile blood pressure   . Nonischemic cardiomyopathy ([place])   . Physical debility 07/20/2019  . Idiopathic chronic venous hypertension of lower extremity with ulcer, bilateral ([place]) 07/15/2019  . H/O skin graft 05/13/2019  . CKD (chronic kidney disease), stage III 01/13/2019    . Infected wound 12/26/2018  . Idiopathic chronic venous hypertension of both lower extremities with ulcer and inflammation ([place])   . History of fall 05/10/2018  . Hyperkalemia 02/21/2018  . Hypotension 02/21/2018  . Sepsis ([place]) 02/21/2018  . Encounter for central line placement   . Mitral regurgitation 10/30/2017  . Acute kidney injury ([place]) 07/21/2017  . Generalized weakness 07/20/2017  . Shock circulatory ([place])   . Acute respiratory failure with hypoxia ([place])   . Pulmonary edema   . Cardiac arrest ([place]) 06/26/2017  . Ventricular fibrillation ([place])   . Acute on chronic diastolic heart failure ([place])   . Abnormal ECG   . Cellulitis 04/02/2017  . AKI (acute kidney injury) ([place]) 12/17/2016  . Cellulitis and abscess of right lower extremity 12/17/2016  . Syncope and collapse 12/17/2016  . Peripheral edema 12/17/2016  . Hypokalemia 12/17/2016  . Anemia of chronic disease 12/17/2016  . Acute diastolic CHF (congestive heart failure) ([place]) 03/14/2014  . CHF (congestive heart failure) ([place]) 03/12/2014  . Sarcoidosis ([place]) 03/12/2014  . Severe sepsis ([place]) 10/17/2013  . ARF (acute renal failure) ([place]) 10/08/2013  . Cellulitis of multiple sites of lower extremity 10/08/2013  . Morbid obesity ([place]) 10/08/2013  . Volume depletion 10/08/2013  . Venous (peripheral) insufficiency 10/28/2012  . Mediastinal lymphadenopathy 10/16/2012  . Pulmonary nodules 10/16/2012  . 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 Chest pain 09/02/2012  . Asthma   . Hypertension   . Depression   . Venous stasis ulcers ([place]) 07/29/2012  . Varicose veins of lower extremities with ulcer ([place]) 07/08/2012  . Venous ulcer of leg ([place]) 07/08/2012   Past Medical History:  Diagnosis Date  . Arthritis   . Asthma   . CHF (congestive heart failure) ([employer]) 03/12/2014   a. EF 40-45% by echo in 06/2017 with normal cors by cath. ICD placed following VT arrest  .  Depression   . Headache(784.0)   . History of blood transfusion   . Hyperlipidemia   . Hypertension   . Lung nodules   . Myocardial infarction ([place]) 06/26/2014  . Renal disorder    followed by [facility]  . Sarcoidosis   . Ulcer    recurring, from chronic venous insufficiency    Family History  Problem Relation Age of Onset  . Heart failure Mother   . Diabetes Mellitus II Mother   . Hypertension Mother   . Cancer Mother        unknown type  . Heart disease Father   . Stroke Father   . Diabetes Mellitus II Father     Past Surgical History:  Procedure Laterality Date  . cataract  surgery Left 07-2013  . I & D EXTREMITY Bilateral 12/31/2018   Procedure: DEBRIDEMENT BILATERAL LEGS, APPLY VAC X 2;  Surgeon: [name], MD;  Location: [facility];  Service: Orthopedics;  Laterality: Bilateral;  . I & D EXTREMITY Bilateral 01/02/2019   Procedure: REPEAT DEBRIDEMENT BILATERAL LEGS, APPLY VAC X 2;  Surgeon: [name], MD;  Location: [facility];  Service: Orthopedics;  Laterality: Bilateral;  . I & D EXTREMITY Bilateral 07/15/2019   Procedure: IRRIGATION AND DEBRIDEMENT VENOUS STASIS INSUFFICIENCY ULCERATIONS BILATERAL LOWER EXTREMITIES;  Surgeon: [name], MD;  Location: [facility];  Service: Orthopedics;  Laterality: Bilateral;  . I & D EXTREMITY Bilateral 08/23/2019   Procedure: DEBRIDEMENT EXTREMITY;  Surgeon: [name], MD;  Location: [facility];  Service: Orthopedics;  Laterality: Bilateral;  . ICD IMPLANT N/A 07/05/2017   Procedure: ICD Implant;  Surgeon: Constance Haw, MD;  Location: [facility];  Service: Cardiovascular;  Laterality: N/A;  . LEFT HEART CATH AND CORONARY ANGIOGRAPHY N/A 07/03/2017   Procedure: Left Heart Cath and Coronary Angiography;  Surgeon: Belva Crome, MD;  Location: [facility];  Service: Cardiovascular;  Laterality: N/A;  . SKIN SPLIT GRAFT Bilateral 01/07/2019   Procedure: SPLIT THICKNESS SKIN GRAFT BILATERAL LEGS, APPLY VAC;  Surgeon:  [name], MD;  Location: [facility];  Service: Orthopedics;  Laterality: Bilateral;  . SKIN SPLIT GRAFT Bilateral 07/17/2019   Procedure: REPEAT IRRIGATION AND DEBRIDEMENT BILATERAL LOWER EXTREMITIES, SKIN GRAFT;  Surgeon: [name], MD;  Location: [place];  Service: Orthopedics;  Laterality: Bilateral;  . SKIN SPLIT GRAFT Bilateral 08/26/2019   Procedure: SKIN GRAFT SPLIT THICKNESS BILATERAL LEGS, APPLY WOUND VAC;  Surgeon: [name], MD;  Location: [facility];  Service: Orthopedics;  Laterality: Bilateral;   Social History   Occupational History  . Not on file  Tobacco Use  . Smoking status: Never Smoker  . Smokeless tobacco: Never Used  Substance and Sexual Activity  . Alcohol use: No  . Drug use: No  . Sexual activity: Not Currently

## 2020-04-14 ENCOUNTER — Other Ambulatory Visit: Payer: Self-pay

## 2020-04-14 ENCOUNTER — Ambulatory Visit (INDEPENDENT_AMBULATORY_CARE_PROVIDER_SITE_OTHER): Payer: Medicare Other | Admitting: Physician Assistant

## 2020-04-14 ENCOUNTER — Encounter: Payer: Self-pay | Admitting: Physician Assistant

## 2020-04-14 VITALS — Ht 67.0 in | Wt 296.0 lb

## 2020-04-14 DIAGNOSIS — L97929 Non-pressure chronic ulcer of unspecified part of left lower leg with unspecified severity: Secondary | ICD-10-CM

## 2020-04-14 DIAGNOSIS — L97919 Non-pressure chronic ulcer of unspecified part of right lower leg with unspecified severity: Secondary | ICD-10-CM | POA: Diagnosis not present

## 2020-04-14 DIAGNOSIS — I87313 Chronic venous hypertension (idiopathic) with ulcer of bilateral lower extremity: Secondary | ICD-10-CM

## 2020-04-14 MED ORDER — OXYCODONE-ACETAMINOPHEN 5-325 MG PO TABS: 1.0000 | ORAL_TABLET | ORAL | 0 refills | Status: AC | PRN

## 2020-04-14 NOTE — Progress Notes (Signed)
Office Visit Note   Patient: Kari [name]           Date of Birth: 1961/01/05           MRN: [id number] Visit Date: 04/14/2020              Requested by: Rutherford Guys, MD [address] [place],  [place] [address] PCP: Rutherford Guys, MD  Chief Complaint  Patient presents with  . Right Leg - Follow-up  . Left Leg - Follow-up      HPI: Patient presents today in follow-up for her bilateral lower extremity skin grafts.  She was doing better and plans on transitioning to a compression sock another week or so.  She is currently using wraps.  She states she thinks the swelling returned because of some extensive ambulation she was doing at the store.  She is also requesting a small amount of pain medication given her increased swelling  Assessment & Plan: Visit Diagnoses: No diagnosis found.  Plan: We will wrap her legs with little Iodosorb today.  She will follow-up in 1 week.  She is not sure when she can begin the compression stockings  Follow-Up Instructions: No follow-ups on file.   Ortho Exam  Patient is alert, oriented, no adenopathy, well-dressed, normal affect, normal respiratory effort. Bilateral lower extremities some increase in swelling though compartments are soft.  Her bilateral ulcer skin grafts have not changed in appearance too much no foul odor there is some fibrous granulation tissue.  No significant drainage  Imaging: No results found. No images are attached to the encounter.  Labs: Lab Results  Component Value Date   HGBA1C 5.8 (H) 08/20/2019   HGBA1C 5.6 12/17/2016   HGBA1C 5.5 03/12/2014   REPTSTATUS 08/28/2019 FINAL 08/23/2019   GRAMSTAIN  08/23/2019    MODERATE WBC PRESENT, PREDOMINANTLY PMN FEW GRAM POSITIVE COCCI IN PAIRS IN CLUSTERS RARE GRAM NEGATIVE RODS    CULT  08/23/2019    FEW PSEUDOMONAS AERUGINOSA FEW PROTEUS MIRABILIS FEW STAPHYLOCOCCUS SPECIES (COAGULASE NEGATIVE) NO ANAEROBES ISOLATED Performed at [facility],  [place] [address], [place], [place] [address]    LABORGA PSEUDOMONAS AERUGINOSA 08/23/2019   LABORGA PROTEUS MIRABILIS 08/23/2019     Lab Results  Component Value Date   ALBUMIN 3.5 (L) 12/31/2019   ALBUMIN 3.7 (L) 09/28/2019   ALBUMIN 1.9 (L) 08/28/2019   PREALBUMIN 19.7 08/31/2019    Lab Results  Component Value Date   MG 2.4 02/08/2020   MG 2.4 01/05/2019   MG 2.3 01/02/2019   Lab Results  Component Value Date   VD25OH 26.7 (L) 08/22/2019    Lab Results  Component Value Date   PREALBUMIN 19.7 08/31/2019   CBC EXTENDED Latest Ref Rng & Units 02/08/2020 08/31/2019 08/29/2019  WBC 4.0 - 10.5 K/uL 8.5 5.5 7.6  RBC 3.87 - 5.11 MIL/uL 4.98 2.85(L) 2.73(L)  HGB 12.0 - 15.0 g/dL 14.1 8.2(L) 7.7(L)  HCT 36.0 - 46.0 % 45.9 27.3(L) 26.9(L)  PLT 150 - 400 K/uL 354 237 244  NEUTROABS 1.7 - 7.7 K/uL - 3.5 -  LYMPHSABS 0.7 - 4.0 K/uL - 1.2 -     Body mass index is 46.36 kg/m.  Orders:  No orders of the defined types were placed in this encounter.  No orders of the defined types were placed in this encounter.    Procedures: No procedures performed  Clinical Data: No additional findings.  ROS:  All other systems negative, except as noted  in the HPI. Review of Systems  Objective: Vital Signs: Ht 5\' 7"  (1.702 m)   Wt 296 lb (134.3 kg)   LMP 11/21/2011   BMI 46.36 kg/m   Specialty Comments:  No specialty comments available.  PMFS History: Patient Active Problem List   Diagnosis Date Noted  . Multiple open wounds of lower leg   . Moderate protein-calorie malnutrition ([place])   . Non-healing ulcer ([place]) 08/19/2019  . Postoperative pain   . Labile blood pressure   . Nonischemic cardiomyopathy ([place])   . Physical debility 07/20/2019  . Idiopathic chronic venous hypertension of lower extremity with ulcer, bilateral ([place]) 07/15/2019  . H/O skin graft 05/13/2019  . CKD (chronic kidney disease), stage III 01/13/2019  . Infected wound 12/26/2018  . Idiopathic chronic venous  hypertension of both lower extremities with ulcer and inflammation ([place])   . History of fall 05/10/2018  . Hyperkalemia 02/21/2018  . Hypotension 02/21/2018  . Sepsis ([place]) 02/21/2018  . Encounter for central line placement   . Mitral regurgitation 10/30/2017  . Acute kidney injury ([place]) 07/21/2017  . Generalized weakness 07/20/2017  . Shock circulatory ([place])   . Acute respiratory failure with hypoxia ([place])   . Pulmonary edema   . Cardiac arrest ([place]) 06/26/2017  . Ventricular fibrillation ([place])   . Acute on chronic diastolic heart failure ([place])   . Abnormal ECG   . Cellulitis 04/02/2017  . AKI (acute kidney injury) ([place]) 12/17/2016  . Cellulitis and abscess of right lower extremity 12/17/2016  . Syncope and collapse 12/17/2016  . Peripheral edema 12/17/2016  . Hypokalemia 12/17/2016  . Anemia of chronic disease 12/17/2016  . Acute diastolic CHF (congestive heart failure) ([place]) 03/14/2014  . CHF (congestive heart failure) ([place]) 03/12/2014  . Sarcoidosis ([place]) 03/12/2014  . Severe sepsis ([place]) 10/17/2013  . ARF (acute renal failure) ([place]) 10/08/2013  . Cellulitis of multiple sites of lower extremity 10/08/2013  . Morbid obesity ([place]) 10/08/2013  . Volume depletion 10/08/2013  . Venous (peripheral) insufficiency 10/28/2012  . Mediastinal lymphadenopathy 10/16/2012  . Pulmonary nodules 10/16/2012  . 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 Chest pain 09/02/2012  . Asthma   . Hypertension   . Depression   . Venous stasis ulcers ([place]) 07/29/2012  . Varicose veins of lower extremities with ulcer ([place]) 07/08/2012  . Venous ulcer of leg ([place]) 07/08/2012   Past Medical History:  Diagnosis Date  . Arthritis   . Asthma   . CHF (congestive heart failure) ([place]) 03/12/2014   a. EF 40-45% by echo in 06/2017 with normal cors by cath. ICD placed following VT arrest  . Depression   . Headache(784.0)   . History of blood transfusion     . Hyperlipidemia   . Hypertension   . Lung nodules   . Myocardial infarction ([place]) 06/26/2014  . Renal disorder    followed by [facility]  . Sarcoidosis   . Ulcer    recurring, from chronic venous insufficiency    Family History  Problem Relation Age of Onset  . Heart failure Mother   . Diabetes Mellitus II Mother   . Hypertension Mother   . Cancer Mother        unknown type  . Heart disease Father   . Stroke Father   . Diabetes Mellitus II Father     Past Surgical History:  Procedure Laterality Date  . cataract surgery Left 07-2013  . I & D EXTREMITY Bilateral 12/31/2018  Procedure: DEBRIDEMENT BILATERAL LEGS, APPLY VAC X 2;  Surgeon: [name], MD;  Location: [facility];  Service: Orthopedics;  Laterality: Bilateral;  . I & D EXTREMITY Bilateral 01/02/2019   Procedure: REPEAT DEBRIDEMENT BILATERAL LEGS, APPLY VAC X 2;  Surgeon: [name], MD;  Location: [facility];  Service: Orthopedics;  Laterality: Bilateral;  . I & D EXTREMITY Bilateral 07/15/2019   Procedure: IRRIGATION AND DEBRIDEMENT VENOUS STASIS INSUFFICIENCY ULCERATIONS BILATERAL LOWER EXTREMITIES;  Surgeon: [name], MD;  Location: [facility];  Service: Orthopedics;  Laterality: Bilateral;  . I & D EXTREMITY Bilateral 08/23/2019   Procedure: DEBRIDEMENT EXTREMITY;  Surgeon: [name], MD;  Location: [facility];  Service: Orthopedics;  Laterality: Bilateral;  . ICD IMPLANT N/A 07/05/2017   Procedure: ICD Implant;  Surgeon: Constance Haw, MD;  Location: [facility];  Service: Cardiovascular;  Laterality: N/A;  . LEFT HEART CATH AND CORONARY ANGIOGRAPHY N/A 07/03/2017   Procedure: Left Heart Cath and Coronary Angiography;  Surgeon: Belva Crome, MD;  Location: [facility];  Service: Cardiovascular;  Laterality: N/A;  . SKIN SPLIT GRAFT Bilateral 01/07/2019   Procedure: SPLIT THICKNESS SKIN GRAFT BILATERAL LEGS, APPLY VAC;  Surgeon: [name], MD;  Location: [facility];  Service: Orthopedics;   Laterality: Bilateral;  . SKIN SPLIT GRAFT Bilateral 07/17/2019   Procedure: REPEAT IRRIGATION AND DEBRIDEMENT BILATERAL LOWER EXTREMITIES, SKIN GRAFT;  Surgeon: [name], MD;  Location: [facility];  Service: Orthopedics;  Laterality: Bilateral;  . SKIN SPLIT GRAFT Bilateral 08/26/2019   Procedure: SKIN GRAFT SPLIT THICKNESS BILATERAL LEGS, APPLY WOUND VAC;  Surgeon: [name], MD;  Location: [facility];  Service: Orthopedics;  Laterality: Bilateral;   Social History   Occupational History  . Not on file  Tobacco Use  . Smoking status: Never Smoker  . Smokeless tobacco: Never Used  Substance and Sexual Activity  . Alcohol use: No  . Drug use: No  . Sexual activity: Not Currently

## 2020-04-15 ENCOUNTER — Other Ambulatory Visit: Payer: Self-pay | Admitting: Physical Medicine & Rehabilitation

## 2020-04-19 ENCOUNTER — Ambulatory Visit: Payer: Medicare Other | Admitting: Family Medicine

## 2020-04-21 ENCOUNTER — Encounter: Payer: Self-pay | Admitting: Orthopedic Surgery

## 2020-04-21 ENCOUNTER — Ambulatory Visit (INDEPENDENT_AMBULATORY_CARE_PROVIDER_SITE_OTHER): Payer: Medicare Other | Admitting: Orthopedic Surgery

## 2020-04-21 ENCOUNTER — Other Ambulatory Visit: Payer: Self-pay

## 2020-04-21 VITALS — Ht 67.0 in | Wt 296.0 lb

## 2020-04-21 DIAGNOSIS — L97929 Non-pressure chronic ulcer of unspecified part of left lower leg with unspecified severity: Secondary | ICD-10-CM

## 2020-04-21 DIAGNOSIS — I87333 Chronic venous hypertension (idiopathic) with ulcer and inflammation of bilateral lower extremity: Secondary | ICD-10-CM | POA: Diagnosis not present

## 2020-04-21 DIAGNOSIS — L97919 Non-pressure chronic ulcer of unspecified part of right lower leg with unspecified severity: Secondary | ICD-10-CM | POA: Diagnosis not present

## 2020-04-26 ENCOUNTER — Ambulatory Visit: Payer: Medicare Other | Attending: Family

## 2020-04-26 ENCOUNTER — Telehealth: Payer: Self-pay

## 2020-04-26 ENCOUNTER — Encounter: Payer: Self-pay | Admitting: Orthopedic Surgery

## 2020-04-26 DIAGNOSIS — Z23 Encounter for immunization: Secondary | ICD-10-CM

## 2020-04-26 NOTE — Telephone Encounter (Signed)
Left message for patient to remind of missed remote transmission.  

## 2020-04-26 NOTE — Progress Notes (Signed)
Office Visit Note   Patient: Kari [name]           Date of Birth: 1961-06-08           MRN: [id number] Visit Date: 04/21/2020              Requested by: Rutherford Guys, MD [address] [place],  [place] [address] PCP: Rutherford Guys, MD  Chief Complaint  Patient presents with  . Left Leg - Follow-up    STSG BLE  . Right Leg - Follow-up      HPI: Patient is a 59 year old woman who presents in follow-up for bilateral venous ulcers to both legs.  Patient states that she has decreased swelling.  Assessment & Plan: Visit Diagnoses:  1. Idiopathic chronic venous hypertension of both lower extremities with ulcer and inflammation ([place])     Plan: Will continue with the compression wraps in the office applying Iodosorb 4 x 4 and Ace to both lower extremities.  Follow-Up Instructions: Return in about 1 week (around 04/28/2020).   Ortho Exam  Patient is alert, oriented, no adenopathy, well-dressed, normal affect, normal respiratory effort. Examination patient has continue to heal slowly with the venous ulcers both lower extremities.  There is continued decrease swelling the wound beds are improving there is epithelization around the wound edges.  Discussed that we will initially need to get her back into compression socks but she states she is not ready yet for the compression sock treatment  Imaging: No results found. No images are attached to the encounter.  Labs: Lab Results  Component Value Date   HGBA1C 5.8 (H) 08/20/2019   HGBA1C 5.6 12/17/2016   HGBA1C 5.5 03/12/2014   REPTSTATUS 08/28/2019 FINAL 08/23/2019   GRAMSTAIN  08/23/2019    MODERATE WBC PRESENT, PREDOMINANTLY PMN FEW GRAM POSITIVE COCCI IN PAIRS IN CLUSTERS RARE GRAM NEGATIVE RODS    CULT  08/23/2019    FEW PSEUDOMONAS AERUGINOSA FEW PROTEUS MIRABILIS FEW STAPHYLOCOCCUS SPECIES (COAGULASE NEGATIVE) NO ANAEROBES ISOLATED Performed at [facility], [place] [address], [place],  [address]    LABORGA PSEUDOMONAS AERUGINOSA 08/23/2019   LABORGA PROTEUS MIRABILIS 08/23/2019     Lab Results  Component Value Date   ALBUMIN 3.5 (L) 12/31/2019   ALBUMIN 3.7 (L) 09/28/2019   ALBUMIN 1.9 (L) 08/28/2019   PREALBUMIN 19.7 08/31/2019    Lab Results  Component Value Date   MG 2.4 02/08/2020   MG 2.4 01/05/2019   MG 2.3 01/02/2019   Lab Results  Component Value Date   VD25OH 26.7 (L) 08/22/2019    Lab Results  Component Value Date   PREALBUMIN 19.7 08/31/2019   CBC EXTENDED Latest Ref Rng & Units 02/08/2020 08/31/2019 08/29/2019  WBC 4.0 - 10.5 K/uL 8.5 5.5 7.6  RBC 3.87 - 5.11 MIL/uL 4.98 2.85(L) 2.73(L)  HGB 12.0 - 15.0 g/dL 14.1 8.2(L) 7.7(L)  HCT 36.0 - 46.0 % 45.9 27.3(L) 26.9(L)  PLT 150 - 400 K/uL 354 237 244  NEUTROABS 1.7 - 7.7 K/uL - 3.5 -  LYMPHSABS 0.7 - 4.0 K/uL - 1.2 -     Body mass index is 46.36 kg/m.  Orders:  No orders of the defined types were placed in this encounter.  No orders of the defined types were placed in this encounter.    Procedures: No procedures performed  Clinical Data: No additional findings.  ROS:  All other systems negative, except as noted in the HPI. Review of Systems  Objective: Vital  Signs: Ht 5\' 7"  (1.702 m)   Wt 296 lb (134.3 kg)   LMP 11/21/2011   BMI 46.36 kg/m   Specialty Comments:  No specialty comments available.  PMFS History: Patient Active Problem List   Diagnosis Date Noted  . Multiple open wounds of lower leg   . Moderate protein-calorie malnutrition ([place])   . Non-healing ulcer ([place]) 08/19/2019  . Postoperative pain   . Labile blood pressure   . Nonischemic cardiomyopathy ([place])   . Physical debility 07/20/2019  . Idiopathic chronic venous hypertension of lower extremity with ulcer, bilateral ([place]) 07/15/2019  . H/O skin graft 05/13/2019  . CKD (chronic kidney disease), stage III 01/13/2019  . Infected wound 12/26/2018  . Idiopathic chronic venous hypertension of both lower extremities  with ulcer and inflammation ([place])   . History of fall 05/10/2018  . Hyperkalemia 02/21/2018  . Hypotension 02/21/2018  . Sepsis ([place]) 02/21/2018  . Encounter for central line placement   . Mitral regurgitation 10/30/2017  . Acute kidney injury ([place]) 07/21/2017  . Generalized weakness 07/20/2017  . Shock circulatory ([place])   . Acute respiratory failure with hypoxia ([place])   . Pulmonary edema   . Cardiac arrest ([place]) 06/26/2017  . Ventricular fibrillation ([place])   . Acute on chronic diastolic heart failure ([place])   . Abnormal ECG   . Cellulitis 04/02/2017  . AKI (acute kidney injury) ([place]) 12/17/2016  . Cellulitis and abscess of right lower extremity 12/17/2016  . Syncope and collapse 12/17/2016  . Peripheral edema 12/17/2016  . Hypokalemia 12/17/2016  . Anemia of chronic disease 12/17/2016  . Acute diastolic CHF (congestive heart failure) ([place]) 03/14/2014  . CHF (congestive heart failure) ([place]) 03/12/2014  . Sarcoidosis ([place]) 03/12/2014  . Severe sepsis ([place]) 10/17/2013  . ARF (acute renal failure) ([place]) 10/08/2013  . Cellulitis of multiple sites of lower extremity 10/08/2013  . Morbid obesity ([place]) 10/08/2013  . Volume depletion 10/08/2013  . Venous (peripheral) insufficiency 10/28/2012  . Mediastinal lymphadenopathy 10/16/2012  . Pulmonary nodules 10/16/2012  . 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 Chest pain 09/02/2012  . Asthma   . Hypertension   . Depression   . Venous stasis ulcers ([place]) 07/29/2012  . Varicose veins of lower extremities with ulcer ([place]) 07/08/2012  . Venous ulcer of leg ([place]) 07/08/2012   Past Medical History:  Diagnosis Date  . Arthritis   . Asthma   . CHF (congestive heart failure) ([place]) 03/12/2014   a. EF 40-45% by echo in 06/2017 with normal cors by cath. ICD placed following VT arrest  . Depression   . Headache(784.0)   . History of blood transfusion   . Hyperlipidemia   . Hypertension     . Lung nodules   . Myocardial infarction ([place]) 06/26/2014  . Renal disorder    followed by [facility]  . Sarcoidosis   . Ulcer    recurring, from chronic venous insufficiency    Family History  Problem Relation Age of Onset  . Heart failure Mother   . Diabetes Mellitus II Mother   . Hypertension Mother   . Cancer Mother        unknown type  . Heart disease Father   . Stroke Father   . Diabetes Mellitus II Father     Past Surgical History:  Procedure Laterality Date  . cataract surgery Left 07-2013  . I & D EXTREMITY Bilateral 12/31/2018   Procedure: DEBRIDEMENT BILATERAL LEGS, APPLY VAC X  2;  Surgeon: [name], MD;  Location: [facility];  Service: Orthopedics;  Laterality: Bilateral;  . I & D EXTREMITY Bilateral 01/02/2019   Procedure: REPEAT DEBRIDEMENT BILATERAL LEGS, APPLY VAC X 2;  Surgeon: [name], MD;  Location: [facility];  Service: Orthopedics;  Laterality: Bilateral;  . I & D EXTREMITY Bilateral 07/15/2019   Procedure: IRRIGATION AND DEBRIDEMENT VENOUS STASIS INSUFFICIENCY ULCERATIONS BILATERAL LOWER EXTREMITIES;  Surgeon: [name], MD;  Location: [facility];  Service: Orthopedics;  Laterality: Bilateral;  . I & D EXTREMITY Bilateral 08/23/2019   Procedure: DEBRIDEMENT EXTREMITY;  Surgeon: [name], MD;  Location: [facility];  Service: Orthopedics;  Laterality: Bilateral;  . ICD IMPLANT N/A 07/05/2017   Procedure: ICD Implant;  Surgeon: Constance Haw, MD;  Location: [facility];  Service: Cardiovascular;  Laterality: N/A;  . LEFT HEART CATH AND CORONARY ANGIOGRAPHY N/A 07/03/2017   Procedure: Left Heart Cath and Coronary Angiography;  Surgeon: Belva Crome, MD;  Location: [facility];  Service: Cardiovascular;  Laterality: N/A;  . SKIN SPLIT GRAFT Bilateral 01/07/2019   Procedure: SPLIT THICKNESS SKIN GRAFT BILATERAL LEGS, APPLY VAC;  Surgeon: [name], MD;  Location: [facility];  Service: Orthopedics;  Laterality: Bilateral;  . SKIN SPLIT  GRAFT Bilateral 07/17/2019   Procedure: REPEAT IRRIGATION AND DEBRIDEMENT BILATERAL LOWER EXTREMITIES, SKIN GRAFT;  Surgeon: [name], MD;  Location: [facility];  Service: Orthopedics;  Laterality: Bilateral;  . SKIN SPLIT GRAFT Bilateral 08/26/2019   Procedure: SKIN GRAFT SPLIT THICKNESS BILATERAL LEGS, APPLY WOUND VAC;  Surgeon: [name], MD;  Location: [facility];  Service: Orthopedics;  Laterality: Bilateral;   Social History   Occupational History  . Not on file  Tobacco Use  . Smoking status: Never Smoker  . Smokeless tobacco: Never Used  Substance and Sexual Activity  . Alcohol use: No  . Drug use: No  . Sexual activity: Not Currently

## 2020-04-26 NOTE — Progress Notes (Signed)
   Covid-19 Vaccination Clinic  Name:  Kari [name]    MRN: [id number] DOB: 1961/01/29  04/26/2020  [name] was observed post Covid-19 immunization for 15 minutes without incident. She was provided with Vaccine Information Sheet and instruction to access the V-Safe system.   [name] was instructed to call 911 with any severe reactions post vaccine: Marland Kitchen Difficulty breathing  . Swelling of face and throat  . A fast heartbeat  . A bad rash all over body  . Dizziness and weakness   Immunizations Administered    Name Date Dose VIS Date Route   Moderna COVID-19 Vaccine 04/26/2020 10:09 AM 0.5 mL 11/2019 Intramuscular   Manufacturer: Levan Hurst   Lot: [id number]   [place]: [id number]      Covid-19 Vaccination Clinic  Name:  Kari [name]    MRN: [id number] DOB: 09/29/1961  04/26/2020  [name] was observed post Covid-19 immunization for 15 minutes without incident. She was provided with Vaccine Information Sheet and instruction to access the V-Safe system.   [name] was instructed to call 911 with any severe reactions post vaccine: Marland Kitchen Difficulty breathing  . Swelling of face and throat  . A fast heartbeat  . A bad rash all over body  . Dizziness and weakness   Immunizations Administered    Name Date Dose VIS Date Route   Moderna COVID-19 Vaccine 04/26/2020 10:09 AM 0.5 mL 11/2019 Intramuscular   Manufacturer: Moderna   Lot: [id number]   [place]: 69507-225-75

## 2020-04-28 ENCOUNTER — Ambulatory Visit: Payer: Medicare Other | Admitting: Orthopedic Surgery

## 2020-05-05 ENCOUNTER — Ambulatory Visit (INDEPENDENT_AMBULATORY_CARE_PROVIDER_SITE_OTHER): Payer: Medicare Other | Admitting: Physician Assistant

## 2020-05-05 ENCOUNTER — Other Ambulatory Visit: Payer: Self-pay | Admitting: Physician Assistant

## 2020-05-05 ENCOUNTER — Ambulatory Visit (INDEPENDENT_AMBULATORY_CARE_PROVIDER_SITE_OTHER): Payer: Medicare Other | Admitting: *Deleted

## 2020-05-05 ENCOUNTER — Other Ambulatory Visit: Payer: Self-pay | Admitting: Family Medicine

## 2020-05-05 ENCOUNTER — Encounter: Payer: Self-pay | Admitting: Orthopedic Surgery

## 2020-05-05 ENCOUNTER — Other Ambulatory Visit: Payer: Self-pay

## 2020-05-05 VITALS — Ht 67.0 in | Wt 296.0 lb

## 2020-05-05 DIAGNOSIS — L97209 Non-pressure chronic ulcer of unspecified calf with unspecified severity: Secondary | ICD-10-CM

## 2020-05-05 DIAGNOSIS — I83002 Varicose veins of unspecified lower extremity with ulcer of calf: Secondary | ICD-10-CM

## 2020-05-05 DIAGNOSIS — I428 Other cardiomyopathies: Secondary | ICD-10-CM | POA: Diagnosis not present

## 2020-05-05 DIAGNOSIS — I5033 Acute on chronic diastolic (congestive) heart failure: Secondary | ICD-10-CM

## 2020-05-05 LAB — CUP PACEART REMOTE DEVICE CHECK
Battery Remaining Longevity: 66 mo
Battery Remaining Percentage: 65 %
Battery Voltage: 2.96 V
Brady Statistic RV Percent Paced: 1 %
Date Time Interrogation Session: 20210512221539
HighPow Impedance: 72 Ohm
HighPow Impedance: 72 Ohm
Implantable Lead Implant Date: 20180713
Implantable Lead Location: 753860
Implantable Pulse Generator Implant Date: 20180713
Lead Channel Impedance Value: 430 Ohm
Lead Channel Pacing Threshold Amplitude: 0.75 V
Lead Channel Pacing Threshold Pulse Width: 0.5 ms
Lead Channel Sensing Intrinsic Amplitude: 11.6 mV
Lead Channel Setting Pacing Amplitude: 2.5 V
Lead Channel Setting Pacing Pulse Width: 0.5 ms
Lead Channel Setting Sensing Sensitivity: 0.5 mV
Pulse Gen Serial Number: 7302172

## 2020-05-05 MED ORDER — HYDROCODONE-ACETAMINOPHEN 5-325 MG PO TABS: 1.0000 | ORAL_TABLET | Freq: Four times a day (QID) | ORAL | 0 refills | Status: AC | PRN

## 2020-05-05 NOTE — Telephone Encounter (Signed)
Requested medication (s) are due for refill today: Yes  Requested medication (s) are on the active medication list: Yes  Last refill:  05/13/19  Future visit scheduled: No  Notes to clinic:  Prescription will expire this month.    Requested Prescriptions  Pending Prescriptions Disp Refills   allopurinol (ZYLOPRIM) 100 MG tablet [Pharmacy Med Name: ALLOPURINOL 100MG  TABLET] 180 tablet 1    Sig: Take 2 tablets (200 mg total) by mouth daily.      Endocrinology:  Gout Agents Failed - 05/05/2020 11:17 AM      Failed - Uric Acid in normal range and within 360 days    No results found for: POCURA, LABURIC        Failed - Cr in normal range and within 360 days    Creat  Date Value Ref Range Status  03/31/2014 0.83 0.50 - 1.10 mg/dL Final   Creatinine, Ser  Date Value Ref Range Status  02/08/2020 1.34 (H) 0.44 - 1.00 mg/dL Final   Creatinine, Urine  Date Value Ref Range Status  08/19/2019 196.79 mg/dL Final    Comment:    Performed at [facility], [place] [address], [place], [place] [address]          Passed - Valid encounter within last 12 months    Recent Outpatient Visits           4 months ago Essential hypertension, benign   [facility], Lilia Argue, MD   7 months ago Essential hypertension, benign   [facility], Lilia Argue, MD   11 months ago Leg pain, bilateral   [facility], Lilia Argue, MD   1 year ago Ulcers of both lower extremities, unspecified ulcer stage [facility])   [facility], Lilia Argue, MD   1 year ago Venous stasis ulcer of calf, unspecified laterality, unspecified ulcer stage, unspecified whether varicose veins present [facility])   [facility], Lilia Argue, MD       Future Appointments             In 1 week [name], MD [facility]

## 2020-05-05 NOTE — Progress Notes (Signed)
Office Visit Note   Patient: Kari [name]           Date of Birth: 03-16-1961           MRN: [id number] Visit Date: 05/05/2020              Requested by: Rutherford Guys, MD [address] [place],  [place] [address] PCP: Rutherford Guys, MD  Chief Complaint  Patient presents with  . Right Leg - Follow-up  . Left Leg - Follow-up      HPI: This is a pleasant woman who comes in today we have been doing sequential compression wraps on her.  She does feel her legs are more swollen today as she is having to walk across her apartment building long distances select visitors in and out  Assessment & Plan: Visit Diagnoses: No diagnosis found.  Plan: We will reapply Iodosorb and compression wraps to her lower extremities follow-up in 1 week Follow-Up Instructions: No follow-ups on file.   Ortho Exam  Patient is alert, oriented, no adenopathy, well-dressed, normal affect, normal respiratory effort. Focused examination bilateral lower extremities open areas have not changed there is good fibrinous tissue on both legs.  With some good skin ingrowth.  Mild soft tissue draining of serous fluid no surrounding cellulitis or fluctuance  Imaging: No results found. No images are attached to the encounter.  Labs: Lab Results  Component Value Date   HGBA1C 5.8 (H) 08/20/2019   HGBA1C 5.6 12/17/2016   HGBA1C 5.5 03/12/2014   REPTSTATUS 08/28/2019 FINAL 08/23/2019   GRAMSTAIN  08/23/2019    MODERATE WBC PRESENT, PREDOMINANTLY PMN FEW GRAM POSITIVE COCCI IN PAIRS IN CLUSTERS RARE GRAM NEGATIVE RODS    CULT  08/23/2019    FEW PSEUDOMONAS AERUGINOSA FEW PROTEUS MIRABILIS FEW STAPHYLOCOCCUS SPECIES (COAGULASE NEGATIVE) NO ANAEROBES ISOLATED Performed at [facility], [place] [address], [place], [place] [address]    LABORGA PSEUDOMONAS AERUGINOSA 08/23/2019   LABORGA PROTEUS MIRABILIS 08/23/2019     Lab Results  Component Value Date   ALBUMIN 3.5 (L) 12/31/2019   ALBUMIN 3.7  (L) 09/28/2019   ALBUMIN 1.9 (L) 08/28/2019   PREALBUMIN 19.7 08/31/2019    Lab Results  Component Value Date   MG 2.4 02/08/2020   MG 2.4 01/05/2019   MG 2.3 01/02/2019   Lab Results  Component Value Date   VD25OH 26.7 (L) 08/22/2019    Lab Results  Component Value Date   PREALBUMIN 19.7 08/31/2019   CBC EXTENDED Latest Ref Rng & Units 02/08/2020 08/31/2019 08/29/2019  WBC 4.0 - 10.5 K/uL 8.5 5.5 7.6  RBC 3.87 - 5.11 MIL/uL 4.98 2.85(L) 2.73(L)  HGB 12.0 - 15.0 g/dL 14.1 8.2(L) 7.7(L)  HCT 36.0 - 46.0 % 45.9 27.3(L) 26.9(L)  PLT 150 - 400 K/uL 354 237 244  NEUTROABS 1.7 - 7.7 K/uL - 3.5 -  LYMPHSABS 0.7 - 4.0 K/uL - 1.2 -     Body mass index is 46.36 kg/m.  Orders:  No orders of the defined types were placed in this encounter.  No orders of the defined types were placed in this encounter.    Procedures: No procedures performed  Clinical Data: No additional findings.  ROS:  All other systems negative, except as noted in the HPI. Review of Systems  Objective: Vital Signs: Ht 5\' 7"  (1.702 m)   Wt 296 lb (134.3 kg)   LMP 11/21/2011   BMI 46.36 kg/m   Specialty Comments:  No specialty  comments available.  PMFS History: Patient Active Problem List   Diagnosis Date Noted  . Multiple open wounds of lower leg   . Moderate protein-calorie malnutrition ([place])   . Non-healing ulcer ([place]) 08/19/2019  . Postoperative pain   . Labile blood pressure   . Nonischemic cardiomyopathy ([place])   . Physical debility 07/20/2019  . Idiopathic chronic venous hypertension of lower extremity with ulcer, bilateral ([place]) 07/15/2019  . H/O skin graft 05/13/2019  . CKD (chronic kidney disease), stage III 01/13/2019  . Infected wound 12/26/2018  . Idiopathic chronic venous hypertension of both lower extremities with ulcer and inflammation ([place])   . History of fall 05/10/2018  . Hyperkalemia 02/21/2018  . Hypotension 02/21/2018  . Sepsis ([place]) 02/21/2018  . Encounter for  central line placement   . Mitral regurgitation 10/30/2017  . Acute kidney injury ([place]) 07/21/2017  . Generalized weakness 07/20/2017  . Shock circulatory ([place])   . Acute respiratory failure with hypoxia ([place])   . Pulmonary edema   . Cardiac arrest ([place]) 06/26/2017  . Ventricular fibrillation ([place])   . Acute on chronic diastolic heart failure ([place])   . Abnormal ECG   . Cellulitis 04/02/2017  . AKI (acute kidney injury) ([place]) 12/17/2016  . Cellulitis and abscess of right lower extremity 12/17/2016  . Syncope and collapse 12/17/2016  . Peripheral edema 12/17/2016  . Hypokalemia 12/17/2016  . Anemia of chronic disease 12/17/2016  . Acute diastolic CHF (congestive heart failure) ([place]) 03/14/2014  . CHF (congestive heart failure) ([place]) 03/12/2014  . Sarcoidosis ([place]) 03/12/2014  . Severe sepsis ([place]) 10/17/2013  . ARF (acute renal failure) ([place]) 10/08/2013  . Cellulitis of multiple sites of lower extremity 10/08/2013  . Morbid obesity ([place]) 10/08/2013  . Volume depletion 10/08/2013  . Venous (peripheral) insufficiency 10/28/2012  . Mediastinal lymphadenopathy 10/16/2012  . Pulmonary nodules 10/16/2012  . 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 Chest pain 09/02/2012  . Asthma   . Hypertension   . Depression   . Venous stasis ulcers ([place]) 07/29/2012  . Varicose veins of lower extremities with ulcer ([place]) 07/08/2012  . Venous ulcer of leg ([place]) 07/08/2012   Past Medical History:  Diagnosis Date  . Arthritis   . Asthma   . CHF (congestive heart failure) ([place]) 03/12/2014   a. EF 40-45% by echo in 06/2017 with normal cors by cath. ICD placed following VT arrest  . Depression   . Headache(784.0)   . History of blood transfusion   . Hyperlipidemia   . Hypertension   . Lung nodules   . Myocardial infarction ([place]) 06/26/2014  . Renal disorder    followed by [facility]  . Sarcoidosis   . Ulcer    recurring, from chronic  venous insufficiency    Family History  Problem Relation Age of Onset  . Heart failure Mother   . Diabetes Mellitus II Mother   . Hypertension Mother   . Cancer Mother        unknown type  . Heart disease Father   . Stroke Father   . Diabetes Mellitus II Father     Past Surgical History:  Procedure Laterality Date  . cataract surgery Left 07-2013  . I & D EXTREMITY Bilateral 12/31/2018   Procedure: DEBRIDEMENT BILATERAL LEGS, APPLY VAC X 2;  Surgeon: [name], MD;  Location: [facility];  Service: Orthopedics;  Laterality: Bilateral;  . I & D EXTREMITY Bilateral 01/02/2019   Procedure: REPEAT DEBRIDEMENT  BILATERAL LEGS, APPLY VAC X 2;  Surgeon: [name], MD;  Location: [facility];  Service: Orthopedics;  Laterality: Bilateral;  . I & D EXTREMITY Bilateral 07/15/2019   Procedure: IRRIGATION AND DEBRIDEMENT VENOUS STASIS INSUFFICIENCY ULCERATIONS BILATERAL LOWER EXTREMITIES;  Surgeon: [name], MD;  Location: [facility];  Service: Orthopedics;  Laterality: Bilateral;  . I & D EXTREMITY Bilateral 08/23/2019   Procedure: DEBRIDEMENT EXTREMITY;  Surgeon: [name], MD;  Location: [facility];  Service: Orthopedics;  Laterality: Bilateral;  . ICD IMPLANT N/A 07/05/2017   Procedure: ICD Implant;  Surgeon: Constance Haw, MD;  Location: [facility];  Service: Cardiovascular;  Laterality: N/A;  . LEFT HEART CATH AND CORONARY ANGIOGRAPHY N/A 07/03/2017   Procedure: Left Heart Cath and Coronary Angiography;  Surgeon: Belva Crome, MD;  Location: [facility];  Service: Cardiovascular;  Laterality: N/A;  . SKIN SPLIT GRAFT Bilateral 01/07/2019   Procedure: SPLIT THICKNESS SKIN GRAFT BILATERAL LEGS, APPLY VAC;  Surgeon: [name], MD;  Location: [facility];  Service: Orthopedics;  Laterality: Bilateral;  . SKIN SPLIT GRAFT Bilateral 07/17/2019   Procedure: REPEAT IRRIGATION AND DEBRIDEMENT BILATERAL LOWER EXTREMITIES, SKIN GRAFT;  Surgeon: [name], MD;  Location: [facility];   Service: Orthopedics;  Laterality: Bilateral;  . SKIN SPLIT GRAFT Bilateral 08/26/2019   Procedure: SKIN GRAFT SPLIT THICKNESS BILATERAL LEGS, APPLY WOUND VAC;  Surgeon: [name], MD;  Location: [facility];  Service: Orthopedics;  Laterality: Bilateral;   Social History   Occupational History  . Not on file  Tobacco Use  . Smoking status: Never Smoker  . Smokeless tobacco: Never Used  Substance and Sexual Activity  . Alcohol use: No  . Drug use: No  . Sexual activity: Not Currently

## 2020-05-05 NOTE — Telephone Encounter (Signed)
No further refills without office visit 

## 2020-05-09 NOTE — Progress Notes (Signed)
Remote ICD transmission.   

## 2020-05-12 ENCOUNTER — Ambulatory Visit (INDEPENDENT_AMBULATORY_CARE_PROVIDER_SITE_OTHER): Payer: Medicare Other | Admitting: Orthopedic Surgery

## 2020-05-12 ENCOUNTER — Other Ambulatory Visit: Payer: Self-pay

## 2020-05-12 ENCOUNTER — Encounter: Payer: Self-pay | Admitting: Orthopedic Surgery

## 2020-05-12 VITALS — Ht 67.0 in | Wt 296.0 lb

## 2020-05-12 DIAGNOSIS — L97929 Non-pressure chronic ulcer of unspecified part of left lower leg with unspecified severity: Secondary | ICD-10-CM

## 2020-05-12 DIAGNOSIS — L97919 Non-pressure chronic ulcer of unspecified part of right lower leg with unspecified severity: Secondary | ICD-10-CM | POA: Diagnosis not present

## 2020-05-12 DIAGNOSIS — I87333 Chronic venous hypertension (idiopathic) with ulcer and inflammation of bilateral lower extremity: Secondary | ICD-10-CM | POA: Diagnosis not present

## 2020-05-16 ENCOUNTER — Encounter: Payer: Self-pay | Admitting: Orthopedic Surgery

## 2020-05-16 NOTE — Progress Notes (Signed)
Office Visit Note   Patient: Kari [name]           Date of Birth: Feb 25, 1961           MRN: [id number] Visit Date: 05/12/2020              Requested by: Rutherford Guys, MD [address] [place],  [place] [address] PCP: Rutherford Guys, MD  Chief Complaint  Patient presents with  . Left Leg - Follow-up    08/26/2019 [facility] BLE  . Right Leg - Follow-up      HPI: Patient is a 59 year old woman who presents status post blood thickness skin graft in September 2020.  Patient is currently doing Iodosorb dressing changes at home.  Assessment & Plan: Visit Diagnoses:  1. Idiopathic chronic venous hypertension of both lower extremities with ulcer and inflammation ([place])     Plan: Iodosorb dressings applied today she will continue with every other day dressing changes recommended elevation with compression  Follow-Up Instructions: Return in about 2 weeks (around 05/26/2020).   Ortho Exam  Patient is alert, oriented, no adenopathy, well-dressed, normal affect, normal respiratory effort. Examination patient has healthy granulating wounds but no significant improvement there is slight decrease in the fibrinous tissue.  There is no cellulitis no odor no drainage.  Imaging: No results found. No images are attached to the encounter.  Labs: Lab Results  Component Value Date   HGBA1C 5.8 (H) 08/20/2019   HGBA1C 5.6 12/17/2016   HGBA1C 5.5 03/12/2014   REPTSTATUS 08/28/2019 FINAL 08/23/2019   GRAMSTAIN  08/23/2019    MODERATE WBC PRESENT, PREDOMINANTLY PMN FEW GRAM POSITIVE COCCI IN PAIRS IN CLUSTERS RARE GRAM NEGATIVE RODS    CULT  08/23/2019    FEW PSEUDOMONAS AERUGINOSA FEW PROTEUS MIRABILIS FEW STAPHYLOCOCCUS SPECIES (COAGULASE NEGATIVE) NO ANAEROBES ISOLATED Performed at [facility], [place] [address], [place], [place] [address]    LABORGA PSEUDOMONAS AERUGINOSA 08/23/2019   LABORGA PROTEUS MIRABILIS 08/23/2019     Lab Results  Component Value Date   ALBUMIN  3.5 (L) 12/31/2019   ALBUMIN 3.7 (L) 09/28/2019   ALBUMIN 1.9 (L) 08/28/2019   PREALBUMIN 19.7 08/31/2019    Lab Results  Component Value Date   MG 2.4 02/08/2020   MG 2.4 01/05/2019   MG 2.3 01/02/2019   Lab Results  Component Value Date   VD25OH 26.7 (L) 08/22/2019    Lab Results  Component Value Date   PREALBUMIN 19.7 08/31/2019   CBC EXTENDED Latest Ref Rng & Units 02/08/2020 08/31/2019 08/29/2019  WBC 4.0 - 10.5 K/uL 8.5 5.5 7.6  RBC 3.87 - 5.11 MIL/uL 4.98 2.85(L) 2.73(L)  HGB 12.0 - 15.0 g/dL 14.1 8.2(L) 7.7(L)  HCT 36.0 - 46.0 % 45.9 27.3(L) 26.9(L)  PLT 150 - 400 K/uL 354 237 244  NEUTROABS 1.7 - 7.7 K/uL - 3.5 -  LYMPHSABS 0.7 - 4.0 K/uL - 1.2 -     Body mass index is 46.36 kg/m.  Orders:  No orders of the defined types were placed in this encounter.  No orders of the defined types were placed in this encounter.    Procedures: No procedures performed  Clinical Data: No additional findings.  ROS:  All other systems negative, except as noted in the HPI. Review of Systems  Objective: Vital Signs: Ht 5\' 7"  (1.702 m)   Wt 296 lb (134.3 kg)   LMP 11/21/2011   BMI 46.36 kg/m   Specialty Comments:  No specialty comments available.  PMFS History: Patient Active Problem List   Diagnosis Date Noted  . Multiple open wounds of lower leg   . Moderate protein-calorie malnutrition ([place])   . Non-healing ulcer ([place]) 08/19/2019  . Postoperative pain   . Labile blood pressure   . Nonischemic cardiomyopathy ([place])   . Physical debility 07/20/2019  . Idiopathic chronic venous hypertension of lower extremity with ulcer, bilateral ([place]) 07/15/2019  . H/O skin graft 05/13/2019  . CKD (chronic kidney disease), stage III 01/13/2019  . Infected wound 12/26/2018  . Idiopathic chronic venous hypertension of both lower extremities with ulcer and inflammation ([place])   . History of fall 05/10/2018  . Hyperkalemia 02/21/2018  . Hypotension 02/21/2018  . Sepsis ([place])  02/21/2018  . Encounter for central line placement   . Mitral regurgitation 10/30/2017  . Acute kidney injury ([place]) 07/21/2017  . Generalized weakness 07/20/2017  . Shock circulatory ([place])   . Acute respiratory failure with hypoxia ([place])   . Pulmonary edema   . Cardiac arrest ([place]) 06/26/2017  . Ventricular fibrillation ([place])   . Acute on chronic diastolic heart failure ([place])   . Abnormal ECG   . Cellulitis 04/02/2017  . AKI (acute kidney injury) ([place]) 12/17/2016  . Cellulitis and abscess of right lower extremity 12/17/2016  . Syncope and collapse 12/17/2016  . Peripheral edema 12/17/2016  . Hypokalemia 12/17/2016  . Anemia of chronic disease 12/17/2016  . Acute diastolic CHF (congestive heart failure) ([place]) 03/14/2014  . CHF (congestive heart failure) ([place]) 03/12/2014  . Sarcoidosis ([place]) 03/12/2014  . Severe sepsis ([place]) 10/17/2013  . ARF (acute renal failure) ([place]) 10/08/2013  . Cellulitis of multiple sites of lower extremity 10/08/2013  . Morbid obesity ([place]) 10/08/2013  . Volume depletion 10/08/2013  . Venous (peripheral) insufficiency 10/28/2012  . Mediastinal lymphadenopathy 10/16/2012  . Pulmonary nodules 10/16/2012  . 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 Chest pain 09/02/2012  . Asthma   . Hypertension   . Depression   . Venous stasis ulcers ([place]) 07/29/2012  . Varicose veins of lower extremities with ulcer ([place]) 07/08/2012  . Venous ulcer of leg ([place]) 07/08/2012   Past Medical History:  Diagnosis Date  . Arthritis   . Asthma   . CHF (congestive heart failure) ([place]) 03/12/2014   a. EF 40-45% by echo in 06/2017 with normal cors by cath. ICD placed following VT arrest  . Depression   . Headache(784.0)   . History of blood transfusion   . Hyperlipidemia   . Hypertension   . Lung nodules   . Myocardial infarction ([place]) 06/26/2014  . Renal disorder    followed by [facility]  . Sarcoidosis   . Ulcer     recurring, from chronic venous insufficiency    Family History  Problem Relation Age of Onset  . Heart failure Mother   . Diabetes Mellitus II Mother   . Hypertension Mother   . Cancer Mother        unknown type  . Heart disease Father   . Stroke Father   . Diabetes Mellitus II Father     Past Surgical History:  Procedure Laterality Date  . cataract surgery Left 07-2013  . I & D EXTREMITY Bilateral 12/31/2018   Procedure: DEBRIDEMENT BILATERAL LEGS, APPLY VAC X 2;  Surgeon: [name], MD;  Location: [facility];  Service: Orthopedics;  Laterality: Bilateral;  . I & D EXTREMITY Bilateral 01/02/2019   Procedure: REPEAT DEBRIDEMENT BILATERAL LEGS, APPLY  VAC X 2;  Surgeon: [name], MD;  Location: [facility];  Service: Orthopedics;  Laterality: Bilateral;  . I & D EXTREMITY Bilateral 07/15/2019   Procedure: IRRIGATION AND DEBRIDEMENT VENOUS STASIS INSUFFICIENCY ULCERATIONS BILATERAL LOWER EXTREMITIES;  Surgeon: [name], MD;  Location: [facility];  Service: Orthopedics;  Laterality: Bilateral;  . I & D EXTREMITY Bilateral 08/23/2019   Procedure: DEBRIDEMENT EXTREMITY;  Surgeon: [name], MD;  Location: [facility];  Service: Orthopedics;  Laterality: Bilateral;  . ICD IMPLANT N/A 07/05/2017   Procedure: ICD Implant;  Surgeon: Constance Haw, MD;  Location: [facility];  Service: Cardiovascular;  Laterality: N/A;  . LEFT HEART CATH AND CORONARY ANGIOGRAPHY N/A 07/03/2017   Procedure: Left Heart Cath and Coronary Angiography;  Surgeon: Belva Crome, MD;  Location: [facility];  Service: Cardiovascular;  Laterality: N/A;  . SKIN SPLIT GRAFT Bilateral 01/07/2019   Procedure: SPLIT THICKNESS SKIN GRAFT BILATERAL LEGS, APPLY VAC;  Surgeon: [name], MD;  Location: [facility];  Service: Orthopedics;  Laterality: Bilateral;  . SKIN SPLIT GRAFT Bilateral 07/17/2019   Procedure: REPEAT IRRIGATION AND DEBRIDEMENT BILATERAL LOWER EXTREMITIES, SKIN GRAFT;  Surgeon: [name], MD;   Location: [facility];  Service: Orthopedics;  Laterality: Bilateral;  . SKIN SPLIT GRAFT Bilateral 08/26/2019   Procedure: SKIN GRAFT SPLIT THICKNESS BILATERAL LEGS, APPLY WOUND VAC;  Surgeon: [name], MD;  Location: [facility];  Service: Orthopedics;  Laterality: Bilateral;   Social History   Occupational History  . Not on file  Tobacco Use  . Smoking status: Never Smoker  . Smokeless tobacco: Never Used  Substance and Sexual Activity  . Alcohol use: No  . Drug use: No  . Sexual activity: Not Currently

## 2020-05-26 ENCOUNTER — Ambulatory Visit: Payer: Medicare Other | Admitting: Orthopedic Surgery

## 2020-06-01 ENCOUNTER — Telehealth: Payer: Self-pay | Admitting: Orthopedic Surgery

## 2020-06-01 NOTE — Telephone Encounter (Signed)
Patient called wanting to speak with you about what is going on with her.  She stated that she has Gout and can not get around that easy due to the pain.   CB#[phone].  Thank you.

## 2020-06-01 NOTE — Telephone Encounter (Signed)
Patient is concerned about her feet and states she is barely walking now due to pain. Patient is scheduled to come in tomorrow and will keep appt. Patient was prescribed Allopurinol by another physician.

## 2020-06-02 ENCOUNTER — Ambulatory Visit: Payer: Medicare Other | Admitting: Orthopedic Surgery

## 2020-06-10 ENCOUNTER — Inpatient Hospital Stay (HOSPITAL_COMMUNITY)
Admission: AD | Admit: 2020-06-10 | Discharge: 2020-06-23 | DRG: 853 | Disposition: E | Payer: Medicare Other | Attending: Critical Care Medicine | Admitting: Critical Care Medicine

## 2020-06-10 ENCOUNTER — Emergency Department (HOSPITAL_COMMUNITY): Payer: Medicare Other

## 2020-06-10 DIAGNOSIS — K631 Perforation of intestine (nontraumatic): Secondary | ICD-10-CM | POA: Diagnosis not present

## 2020-06-10 DIAGNOSIS — E43 Unspecified severe protein-calorie malnutrition: Secondary | ICD-10-CM | POA: Diagnosis present

## 2020-06-10 DIAGNOSIS — Z95810 Presence of automatic (implantable) cardiac defibrillator: Secondary | ICD-10-CM

## 2020-06-10 DIAGNOSIS — M109 Gout, unspecified: Secondary | ICD-10-CM | POA: Diagnosis present

## 2020-06-10 DIAGNOSIS — M1990 Unspecified osteoarthritis, unspecified site: Secondary | ICD-10-CM | POA: Diagnosis present

## 2020-06-10 DIAGNOSIS — Z8674 Personal history of sudden cardiac arrest: Secondary | ICD-10-CM

## 2020-06-10 DIAGNOSIS — E878 Other disorders of electrolyte and fluid balance, not elsewhere classified: Secondary | ICD-10-CM | POA: Diagnosis present

## 2020-06-10 DIAGNOSIS — I428 Other cardiomyopathies: Secondary | ICD-10-CM | POA: Diagnosis not present

## 2020-06-10 DIAGNOSIS — Z885 Allergy status to narcotic agent status: Secondary | ICD-10-CM

## 2020-06-10 DIAGNOSIS — D62 Acute posthemorrhagic anemia: Secondary | ICD-10-CM | POA: Diagnosis present

## 2020-06-10 DIAGNOSIS — R140 Abdominal distension (gaseous): Secondary | ICD-10-CM

## 2020-06-10 DIAGNOSIS — R7303 Prediabetes: Secondary | ICD-10-CM | POA: Diagnosis present

## 2020-06-10 DIAGNOSIS — I4729 Other ventricular tachycardia: Secondary | ICD-10-CM

## 2020-06-10 DIAGNOSIS — C188 Malignant neoplasm of overlapping sites of colon: Secondary | ICD-10-CM | POA: Diagnosis not present

## 2020-06-10 DIAGNOSIS — E861 Hypovolemia: Secondary | ICD-10-CM | POA: Diagnosis present

## 2020-06-10 DIAGNOSIS — I872 Venous insufficiency (chronic) (peripheral): Secondary | ICD-10-CM | POA: Diagnosis present

## 2020-06-10 DIAGNOSIS — I5042 Chronic combined systolic (congestive) and diastolic (congestive) heart failure: Secondary | ICD-10-CM | POA: Diagnosis present

## 2020-06-10 DIAGNOSIS — D631 Anemia in chronic kidney disease: Secondary | ICD-10-CM | POA: Diagnosis present

## 2020-06-10 DIAGNOSIS — W1830XA Fall on same level, unspecified, initial encounter: Secondary | ICD-10-CM | POA: Diagnosis present

## 2020-06-10 DIAGNOSIS — K659 Peritonitis, unspecified: Secondary | ICD-10-CM | POA: Diagnosis present

## 2020-06-10 DIAGNOSIS — R578 Other shock: Secondary | ICD-10-CM | POA: Diagnosis not present

## 2020-06-10 DIAGNOSIS — E872 Acidosis: Secondary | ICD-10-CM | POA: Diagnosis present

## 2020-06-10 DIAGNOSIS — Z452 Encounter for adjustment and management of vascular access device: Secondary | ICD-10-CM

## 2020-06-10 DIAGNOSIS — Z01818 Encounter for other preprocedural examination: Secondary | ICD-10-CM

## 2020-06-10 DIAGNOSIS — Z6841 Body Mass Index (BMI) 40.0 and over, adult: Secondary | ICD-10-CM | POA: Diagnosis not present

## 2020-06-10 DIAGNOSIS — I472 Ventricular tachycardia: Secondary | ICD-10-CM | POA: Diagnosis not present

## 2020-06-10 DIAGNOSIS — L97929 Non-pressure chronic ulcer of unspecified part of left lower leg with unspecified severity: Secondary | ICD-10-CM | POA: Diagnosis present

## 2020-06-10 DIAGNOSIS — K661 Hemoperitoneum: Secondary | ICD-10-CM | POA: Diagnosis not present

## 2020-06-10 DIAGNOSIS — D689 Coagulation defect, unspecified: Secondary | ICD-10-CM | POA: Diagnosis present

## 2020-06-10 DIAGNOSIS — R531 Weakness: Secondary | ICD-10-CM | POA: Diagnosis present

## 2020-06-10 DIAGNOSIS — E876 Hypokalemia: Secondary | ICD-10-CM | POA: Diagnosis present

## 2020-06-10 DIAGNOSIS — I130 Hypertensive heart and chronic kidney disease with heart failure and stage 1 through stage 4 chronic kidney disease, or unspecified chronic kidney disease: Secondary | ICD-10-CM | POA: Diagnosis present

## 2020-06-10 DIAGNOSIS — R188 Other ascites: Secondary | ICD-10-CM | POA: Diagnosis not present

## 2020-06-10 DIAGNOSIS — K55039 Acute (reversible) ischemia of large intestine, extent unspecified: Secondary | ICD-10-CM | POA: Diagnosis not present

## 2020-06-10 DIAGNOSIS — G8929 Other chronic pain: Secondary | ICD-10-CM | POA: Diagnosis present

## 2020-06-10 DIAGNOSIS — N83202 Unspecified ovarian cyst, left side: Secondary | ICD-10-CM | POA: Diagnosis present

## 2020-06-10 DIAGNOSIS — K55019 Acute (reversible) ischemia of small intestine, extent unspecified: Secondary | ICD-10-CM | POA: Diagnosis not present

## 2020-06-10 DIAGNOSIS — J9601 Acute respiratory failure with hypoxia: Secondary | ICD-10-CM | POA: Diagnosis present

## 2020-06-10 DIAGNOSIS — L97919 Non-pressure chronic ulcer of unspecified part of right lower leg with unspecified severity: Secondary | ICD-10-CM | POA: Diagnosis present

## 2020-06-10 DIAGNOSIS — E785 Hyperlipidemia, unspecified: Secondary | ICD-10-CM | POA: Diagnosis present

## 2020-06-10 DIAGNOSIS — Z9911 Dependence on respirator [ventilator] status: Secondary | ICD-10-CM | POA: Diagnosis not present

## 2020-06-10 DIAGNOSIS — Z79899 Other long term (current) drug therapy: Secondary | ICD-10-CM

## 2020-06-10 DIAGNOSIS — G9341 Metabolic encephalopathy: Secondary | ICD-10-CM | POA: Diagnosis not present

## 2020-06-10 DIAGNOSIS — K56609 Unspecified intestinal obstruction, unspecified as to partial versus complete obstruction: Secondary | ICD-10-CM | POA: Diagnosis not present

## 2020-06-10 DIAGNOSIS — I340 Nonrheumatic mitral (valve) insufficiency: Secondary | ICD-10-CM | POA: Diagnosis present

## 2020-06-10 DIAGNOSIS — I493 Ventricular premature depolarization: Secondary | ICD-10-CM | POA: Diagnosis not present

## 2020-06-10 DIAGNOSIS — K55069 Acute infarction of intestine, part and extent unspecified: Secondary | ICD-10-CM | POA: Diagnosis not present

## 2020-06-10 DIAGNOSIS — K420 Umbilical hernia with obstruction, without gangrene: Secondary | ICD-10-CM | POA: Diagnosis present

## 2020-06-10 DIAGNOSIS — C172 Malignant neoplasm of ileum: Secondary | ICD-10-CM | POA: Diagnosis not present

## 2020-06-10 DIAGNOSIS — D271 Benign neoplasm of left ovary: Secondary | ICD-10-CM | POA: Diagnosis not present

## 2020-06-10 DIAGNOSIS — Z20822 Contact with and (suspected) exposure to covid-19: Secondary | ICD-10-CM | POA: Diagnosis present

## 2020-06-10 DIAGNOSIS — R6510 Systemic inflammatory response syndrome (SIRS) of non-infectious origin without acute organ dysfunction: Secondary | ICD-10-CM | POA: Diagnosis present

## 2020-06-10 DIAGNOSIS — Z7982 Long term (current) use of aspirin: Secondary | ICD-10-CM

## 2020-06-10 DIAGNOSIS — E870 Hyperosmolality and hypernatremia: Secondary | ICD-10-CM | POA: Diagnosis present

## 2020-06-10 DIAGNOSIS — R6521 Severe sepsis with septic shock: Secondary | ICD-10-CM | POA: Diagnosis not present

## 2020-06-10 DIAGNOSIS — N179 Acute kidney failure, unspecified: Secondary | ICD-10-CM

## 2020-06-10 DIAGNOSIS — D696 Thrombocytopenia, unspecified: Secondary | ICD-10-CM | POA: Diagnosis not present

## 2020-06-10 DIAGNOSIS — I9589 Other hypotension: Secondary | ICD-10-CM | POA: Diagnosis not present

## 2020-06-10 DIAGNOSIS — D869 Sarcoidosis, unspecified: Secondary | ICD-10-CM | POA: Diagnosis present

## 2020-06-10 DIAGNOSIS — J45909 Unspecified asthma, uncomplicated: Secondary | ICD-10-CM | POA: Diagnosis present

## 2020-06-10 DIAGNOSIS — N1831 Chronic kidney disease, stage 3a: Secondary | ICD-10-CM | POA: Diagnosis present

## 2020-06-10 DIAGNOSIS — N809 Endometriosis, unspecified: Secondary | ICD-10-CM | POA: Diagnosis present

## 2020-06-10 DIAGNOSIS — C189 Malignant neoplasm of colon, unspecified: Secondary | ICD-10-CM | POA: Diagnosis present

## 2020-06-10 DIAGNOSIS — K567 Ileus, unspecified: Secondary | ICD-10-CM | POA: Diagnosis present

## 2020-06-10 DIAGNOSIS — I4510 Unspecified right bundle-branch block: Secondary | ICD-10-CM | POA: Diagnosis present

## 2020-06-10 DIAGNOSIS — K7689 Other specified diseases of liver: Secondary | ICD-10-CM | POA: Diagnosis present

## 2020-06-10 DIAGNOSIS — Y92002 Bathroom of unspecified non-institutional (private) residence single-family (private) house as the place of occurrence of the external cause: Secondary | ICD-10-CM | POA: Diagnosis not present

## 2020-06-10 DIAGNOSIS — A419 Sepsis, unspecified organism: Secondary | ICD-10-CM | POA: Diagnosis not present

## 2020-06-10 DIAGNOSIS — D6959 Other secondary thrombocytopenia: Secondary | ICD-10-CM | POA: Diagnosis present

## 2020-06-10 DIAGNOSIS — I87309 Chronic venous hypertension (idiopathic) without complications of unspecified lower extremity: Secondary | ICD-10-CM | POA: Diagnosis present

## 2020-06-10 DIAGNOSIS — G9340 Encephalopathy, unspecified: Secondary | ICD-10-CM | POA: Diagnosis not present

## 2020-06-10 DIAGNOSIS — I878 Other specified disorders of veins: Secondary | ICD-10-CM | POA: Diagnosis present

## 2020-06-10 DIAGNOSIS — I252 Old myocardial infarction: Secondary | ICD-10-CM

## 2020-06-10 DIAGNOSIS — Z4659 Encounter for fitting and adjustment of other gastrointestinal appliance and device: Secondary | ICD-10-CM

## 2020-06-10 DIAGNOSIS — R579 Shock, unspecified: Secondary | ICD-10-CM | POA: Diagnosis not present

## 2020-06-10 DIAGNOSIS — F329 Major depressive disorder, single episode, unspecified: Secondary | ICD-10-CM | POA: Diagnosis present

## 2020-06-10 DIAGNOSIS — I469 Cardiac arrest, cause unspecified: Secondary | ICD-10-CM | POA: Diagnosis not present

## 2020-06-10 DIAGNOSIS — E860 Dehydration: Secondary | ICD-10-CM | POA: Diagnosis present

## 2020-06-10 DIAGNOSIS — K5900 Constipation, unspecified: Secondary | ICD-10-CM | POA: Diagnosis not present

## 2020-06-10 DIAGNOSIS — Z8249 Family history of ischemic heart disease and other diseases of the circulatory system: Secondary | ICD-10-CM

## 2020-06-10 LAB — COMPREHENSIVE METABOLIC PANEL
ALT: 16 U/L (ref 0–44)
AST: 20 U/L (ref 15–41)
Albumin: 1.4 g/dL — ABNORMAL LOW (ref 3.5–5.0)
Alkaline Phosphatase: 81 U/L (ref 38–126)
Anion gap: 12 (ref 5–15)
BUN: 66 mg/dL — ABNORMAL HIGH (ref 6–20)
CO2: 25 mmol/L (ref 22–32)
Calcium: 7.5 mg/dL — ABNORMAL LOW (ref 8.9–10.3)
Chloride: 106 mmol/L (ref 98–111)
Creatinine, Ser: 2.4 mg/dL — ABNORMAL HIGH (ref 0.44–1.00)
GFR calc Af Amer: 25 mL/min — ABNORMAL LOW (ref >60)
GFR calc non Af Amer: 22 mL/min — ABNORMAL LOW (ref >60)
Glucose, Bld: 100 mg/dL — ABNORMAL HIGH (ref 70–99)
Potassium: 2.8 mmol/L — ABNORMAL LOW (ref 3.5–5.1)
Sodium: 143 mmol/L (ref 135–145)
Total Bilirubin: 1.8 mg/dL — ABNORMAL HIGH (ref 0.3–1.2)
Total Protein: 5.5 g/dL — ABNORMAL LOW (ref 6.5–8.1)

## 2020-06-10 LAB — URINALYSIS, ROUTINE W REFLEX MICROSCOPIC
Bilirubin Urine: NEGATIVE
Glucose, UA: NEGATIVE mg/dL
Hgb urine dipstick: NEGATIVE
Ketones, ur: NEGATIVE mg/dL
Leukocytes,Ua: NEGATIVE
Nitrite: NEGATIVE
Protein, ur: 30 mg/dL — AB
Specific Gravity, Urine: 1.019 (ref 1.005–1.030)
pH: 5 (ref 5.0–8.0)

## 2020-06-10 LAB — CBC WITH DIFFERENTIAL/PLATELET
Abs Immature Granulocytes: 0 10*3/uL (ref 0.00–0.07)
Basophils Absolute: 0 10*3/uL (ref 0.0–0.1)
Basophils Relative: 0 %
Eosinophils Absolute: 0 10*3/uL (ref 0.0–0.5)
Eosinophils Relative: 0 %
HCT: 35.8 % — ABNORMAL LOW (ref 36.0–46.0)
Hemoglobin: 11.5 g/dL — ABNORMAL LOW (ref 12.0–15.0)
Lymphocytes Relative: 9 %
Lymphs Abs: 1.1 10*3/uL (ref 0.7–4.0)
MCH: 30 pg (ref 26.0–34.0)
MCHC: 32.1 g/dL (ref 30.0–36.0)
MCV: 93.5 fL (ref 80.0–100.0)
Monocytes Absolute: 0.4 10*3/uL (ref 0.1–1.0)
Monocytes Relative: 3 %
Neutro Abs: 10.6 10*3/uL — ABNORMAL HIGH (ref 1.7–7.7)
Neutrophils Relative %: 88 %
Platelets: 212 10*3/uL (ref 150–400)
RBC: 3.83 MIL/uL — ABNORMAL LOW (ref 3.87–5.11)
RDW: 16.6 % — ABNORMAL HIGH (ref 11.5–15.5)
WBC: 12 10*3/uL — ABNORMAL HIGH (ref 4.0–10.5)
nRBC: 0 % (ref 0.0–0.2)
nRBC: 0 /100 WBC

## 2020-06-10 LAB — MAGNESIUM: Magnesium: 2.3 mg/dL (ref 1.7–2.4)

## 2020-06-10 LAB — TSH: TSH: 6.189 u[IU]/mL — ABNORMAL HIGH (ref 0.350–4.500)

## 2020-06-10 LAB — T4, FREE: Free T4: 0.65 ng/dL (ref 0.61–1.12)

## 2020-06-10 LAB — LACTIC ACID, PLASMA: Lactic Acid, Venous: 2 mmol/L (ref 0.5–1.9)

## 2020-06-10 LAB — CK: Total CK: 96 U/L (ref 38–234)

## 2020-06-10 LAB — PROTIME-INR
INR: 1.3 — ABNORMAL HIGH (ref 0.8–1.2)
Prothrombin Time: 16.1 seconds — ABNORMAL HIGH (ref 11.4–15.2)

## 2020-06-10 LAB — SARS CORONAVIRUS 2 BY RT PCR (HOSPITAL ORDER, PERFORMED IN ~~LOC~~ HOSPITAL LAB): SARS Coronavirus 2: NEGATIVE

## 2020-06-10 LAB — APTT: aPTT: 27 seconds (ref 24–36)

## 2020-06-10 MED ORDER — SODIUM CHLORIDE 0.9 % IV BOLUS (SEPSIS)
1000.0000 mL | Freq: Once | INTRAVENOUS | Status: AC
  Administered 2020-06-10: 1000 mL via INTRAVENOUS

## 2020-06-10 MED ORDER — ALLOPURINOL 100 MG PO TABS
50.0000 mg | ORAL_TABLET | ORAL | Status: DC
  Administered 2020-06-12 – 2020-06-13 (×2): 50 mg via ORAL
  Filled 2020-06-10 (×2): qty 1

## 2020-06-10 MED ORDER — SODIUM CHLORIDE 0.9 % IV BOLUS
500.0000 mL | Freq: Once | INTRAVENOUS | Status: AC
  Administered 2020-06-10: 500 mL via INTRAVENOUS

## 2020-06-10 MED ORDER — LACTATED RINGERS IV BOLUS
1000.0000 mL | Freq: Once | INTRAVENOUS | Status: AC
  Administered 2020-06-10: 1000 mL via INTRAVENOUS

## 2020-06-10 MED ORDER — ENOXAPARIN SODIUM 40 MG/0.4ML ~~LOC~~ SOLN
40.0000 mg | Freq: Every day | SUBCUTANEOUS | Status: DC
  Administered 2020-06-11 – 2020-06-13 (×3): 40 mg via SUBCUTANEOUS
  Filled 2020-06-10 (×3): qty 0.4

## 2020-06-10 MED ORDER — ALBUTEROL SULFATE (2.5 MG/3ML) 0.083% IN NEBU: 2.5000 mg | INHALATION_SOLUTION | Freq: Four times a day (QID) | RESPIRATORY_TRACT | Status: DC | PRN

## 2020-06-10 MED ORDER — HYDROCORTISONE NA SUCCINATE PF 100 MG IJ SOLR
100.0000 mg | Freq: Once | INTRAMUSCULAR | Status: AC
  Administered 2020-06-10: 100 mg via INTRAVENOUS
  Filled 2020-06-10: qty 2

## 2020-06-10 MED ORDER — POTASSIUM CHLORIDE 10 MEQ/100ML IV SOLN
10.0000 meq | INTRAVENOUS | Status: AC
  Administered 2020-06-10 (×3): 10 meq via INTRAVENOUS
  Filled 2020-06-10 (×3): qty 100

## 2020-06-10 MED ORDER — SODIUM CHLORIDE 0.9 % IV SOLN
2.0000 g | Freq: Two times a day (BID) | INTRAVENOUS | Status: DC
  Administered 2020-06-11 – 2020-06-13 (×6): 2 g via INTRAVENOUS
  Filled 2020-06-10 (×8): qty 2

## 2020-06-10 MED ORDER — ASPIRIN EC 81 MG PO TBEC
81.0000 mg | DELAYED_RELEASE_TABLET | Freq: Every day | ORAL | Status: DC
  Administered 2020-06-11 – 2020-06-13 (×3): 81 mg via ORAL
  Filled 2020-06-10 (×3): qty 1

## 2020-06-10 MED ORDER — VANCOMYCIN HCL 2000 MG/400ML IV SOLN
2000.0000 mg | Freq: Once | INTRAVENOUS | Status: AC
  Administered 2020-06-10: 2000 mg via INTRAVENOUS
  Filled 2020-06-10: qty 400

## 2020-06-10 MED ORDER — SODIUM CHLORIDE 0.9 % IV BOLUS (SEPSIS)
500.0000 mL | Freq: Once | INTRAVENOUS | Status: AC
  Administered 2020-06-10: 500 mL via INTRAVENOUS

## 2020-06-10 MED ORDER — VANCOMYCIN HCL 1250 MG/250ML IV SOLN
1250.0000 mg | INTRAVENOUS | Status: DC
  Administered 2020-06-11 – 2020-06-12 (×2): 1250 mg via INTRAVENOUS
  Filled 2020-06-10 (×4): qty 250

## 2020-06-10 MED ORDER — ACETAMINOPHEN 325 MG PO TABS: 325.0000 mg | ORAL_TABLET | ORAL | Status: DC | PRN

## 2020-06-10 MED ORDER — SODIUM CHLORIDE 0.9 % IV SOLN
2.0000 g | Freq: Once | INTRAVENOUS | Status: AC
  Administered 2020-06-10: 2 g via INTRAVENOUS
  Filled 2020-06-10: qty 2

## 2020-06-10 MED ORDER — VANCOMYCIN HCL IN DEXTROSE 1-5 GM/200ML-% IV SOLN: 1000.0000 mg | Freq: Once | INTRAVENOUS | Status: DC

## 2020-06-10 MED ORDER — ACETAMINOPHEN 500 MG PO TABS
1000.0000 mg | ORAL_TABLET | Freq: Once | ORAL | Status: AC
  Administered 2020-06-10: 1000 mg via ORAL
  Filled 2020-06-10: qty 2

## 2020-06-10 MED ORDER — ALBUMIN HUMAN 5 % IV SOLN
50.0000 g | Freq: Once | INTRAVENOUS | Status: AC
  Administered 2020-06-10: 50 g via INTRAVENOUS
  Filled 2020-06-10: qty 1000

## 2020-06-10 MED ORDER — SODIUM CHLORIDE 0.9 % IV SOLN: INTRAVENOUS | Status: DC

## 2020-06-10 MED ORDER — ALLOPURINOL 100 MG PO TABS: 100.0000 mg | ORAL_TABLET | Freq: Two times a day (BID) | ORAL | Status: DC

## 2020-06-10 NOTE — ED Provider Notes (Signed)
[facility] EMERGENCY DEPARTMENT Provider Note   CSN: [id number] Arrival date & time: 06/15/2020  1259     History Chief Complaint  Patient presents with   Weakness    Kari [name] is a 59 y.o. female with pertinent past medical history of asthma, CHF, hyperlipidemia, hypertension, sarcoidosis, history of MI, CKD stage III that presents emergency department today for weakness.  Patient states that she was walking to the bathroom yesterday, states that she felt weak and could not make it, states that she laid down on the floor and could not get back up.  Was on the floor for about 14 hours until her sister arrived.  Denies falling, hitting her head, LOC.  States that she is not currently in pain right now, states that she feels extremely weak which is how she felt yesterday.  States that she was in normal health before this.  States that she has 2 ulcers on her bilateral feet that have been draining, is seeing Dr. Sharol Given for this.  Denies any pain currently, states that she did not have any chest pain, headache, vision changes, numbness and tingling, shortness of breath, or any symptoms before she felt weak.  She said weakness was sudden onset.  Does not have any thyroid disorders.  Has been taking her medications as prescribed.  States that this is never happened to her before.  Denies any confusion, lightheadedness, syncopal episodes prior.  HPI     Past Medical History:  Diagnosis Date   Arthritis    Asthma    CHF (congestive heart failure) ([place]) 03/12/2014   a. EF 40-45% by echo in 06/2017 with normal cors by cath. ICD placed following VT arrest   Depression    Headache(784.0)    History of blood transfusion    Hyperlipidemia    Hypertension    Lung nodules    Myocardial infarction ([place]) 06/26/2014   Renal disorder    followed by [facility]   Sarcoidosis    Ulcer    recurring, from chronic venous insufficiency    Patient Active Problem  List   Diagnosis Date Noted   Multiple open wounds of lower leg    Moderate protein-calorie malnutrition ([place])    Non-healing ulcer ([place]) 08/19/2019   Postoperative pain    Labile blood pressure    Nonischemic cardiomyopathy ([place])    Physical debility 07/20/2019   Idiopathic chronic venous hypertension of lower extremity with ulcer, bilateral ([place]) 07/15/2019   H/O skin graft 05/13/2019   CKD (chronic kidney disease), stage III 01/13/2019   Infected wound 12/26/2018   Idiopathic chronic venous hypertension of both lower extremities with ulcer and inflammation ([place])    History of fall 05/10/2018   Hyperkalemia 02/21/2018   Hypotension 02/21/2018   Sepsis ([place]) 02/21/2018   Encounter for central line placement    Mitral regurgitation 10/30/2017   Acute kidney injury ([place]) 07/21/2017   Generalized weakness 07/20/2017   Shock circulatory ([place])    Acute respiratory failure with hypoxia ([place])    Pulmonary edema    Cardiac arrest ([place]) 06/26/2017   Ventricular fibrillation ([place])    Acute on chronic diastolic heart failure ([place])    Abnormal ECG    Cellulitis 04/02/2017   AKI (acute kidney injury) ([place]) 12/17/2016   Cellulitis and abscess of right lower extremity 12/17/2016   Syncope and collapse 12/17/2016   Peripheral edema 12/17/2016   Hypokalemia 12/17/2016   Anemia of chronic disease 34/74/2595   Acute diastolic  CHF (congestive heart failure) ([place]) 03/14/2014   CHF (congestive heart failure) ([place]) 03/12/2014   Sarcoidosis ([place]) 03/12/2014   Severe sepsis ([place]) 10/17/2013   ARF (acute renal failure) ([place]) 10/08/2013   Cellulitis of multiple sites of lower extremity 10/08/2013   Morbid obesity ([place]) 10/08/2013   Volume depletion 10/08/2013   Venous (peripheral) insufficiency 10/28/2012   Mediastinal lymphadenopathy 10/16/2012   Pulmonary nodules 10/16/2012   Atherosclerosis of native arteries of the extremities with  ulceration(440.23) 09/30/2012   Chest pain 09/02/2012   Asthma    Hypertension    Depression    Venous stasis ulcers ([place]) 07/29/2012   Varicose veins of lower extremities with ulcer ([place]) 07/08/2012   Venous ulcer of leg ([place]) 07/08/2012    Past Surgical History:  Procedure Laterality Date   cataract surgery Left 07-2013   I & D EXTREMITY Bilateral 12/31/2018   Procedure: DEBRIDEMENT BILATERAL LEGS, APPLY VAC X 2;  Surgeon: [name], MD;  Location: [facility];  Service: Orthopedics;  Laterality: Bilateral;   I & D EXTREMITY Bilateral 01/02/2019   Procedure: REPEAT DEBRIDEMENT BILATERAL LEGS, APPLY VAC X 2;  Surgeon: [name], MD;  Location: [facility];  Service: Orthopedics;  Laterality: Bilateral;   I & D EXTREMITY Bilateral 07/15/2019   Procedure: IRRIGATION AND DEBRIDEMENT VENOUS STASIS INSUFFICIENCY ULCERATIONS BILATERAL LOWER EXTREMITIES;  Surgeon: [name], MD;  Location: [facility];  Service: Orthopedics;  Laterality: Bilateral;   I & D EXTREMITY Bilateral 08/23/2019   Procedure: DEBRIDEMENT EXTREMITY;  Surgeon: [name], MD;  Location: [facility];  Service: Orthopedics;  Laterality: Bilateral;   ICD IMPLANT N/A 07/05/2017   Procedure: ICD Implant;  Surgeon: Constance Haw, MD;  Location: [facility];  Service: Cardiovascular;  Laterality: N/A;   LEFT HEART CATH AND CORONARY ANGIOGRAPHY N/A 07/03/2017   Procedure: Left Heart Cath and Coronary Angiography;  Surgeon: Belva Crome, MD;  Location: [facility];  Service: Cardiovascular;  Laterality: N/A;   SKIN SPLIT GRAFT Bilateral 01/07/2019   Procedure: SPLIT THICKNESS SKIN GRAFT BILATERAL LEGS, APPLY VAC;  Surgeon: [name], MD;  Location: [facility];  Service: Orthopedics;  Laterality: Bilateral;   SKIN SPLIT GRAFT Bilateral 07/17/2019   Procedure: REPEAT IRRIGATION AND DEBRIDEMENT BILATERAL LOWER EXTREMITIES, SKIN GRAFT;  Surgeon: [name], MD;  Location: [facility];  Service: Orthopedics;   Laterality: Bilateral;   SKIN SPLIT GRAFT Bilateral 08/26/2019   Procedure: SKIN GRAFT SPLIT THICKNESS BILATERAL LEGS, APPLY WOUND VAC;  Surgeon: [name], MD;  Location: [facility];  Service: Orthopedics;  Laterality: Bilateral;     OB History   No obstetric history on file.     Family History  Problem Relation Age of Onset   Heart failure Mother    Diabetes Mellitus II Mother    Hypertension Mother    Cancer Mother        unknown type   Heart disease Father    Stroke Father    Diabetes Mellitus II Father     Social History   Tobacco Use   Smoking status: Never Smoker   Smokeless tobacco: Never Used  Vaping Use   Vaping Use: Never used  Substance Use Topics   Alcohol use: No   Drug use: No    Home Medications Prior to Admission medications   Medication Sig Start Date End Date Taking? Authorizing Provider  acetaminophen (TYLENOL) 325 MG tablet Take 1-2 tablets (325-650 mg total) by mouth  every 4 (four) hours as needed for mild pain. 07/22/19   Love, Ivan Anchors, PA-C  albuterol (PROVENTIL HFA;VENTOLIN HFA) 108 (90 Base) MCG/ACT inhaler Inhale 2 puffs into the lungs every 6 (six) hours as needed for shortness of breath. 02/02/19   Medina-Vargas, Monina C, NP  allopurinol (ZYLOPRIM) 100 MG tablet TAKE 2 TABLETS (200 MG TOTAL) BY MOUTH DAILY. 05/05/20   Rutherford Guys, MD  aspirin EC 81 MG tablet Take 81 mg by mouth daily.    [provider]  carvedilol (COREG) 6.25 MG tablet Take 1 tablet (6.25 mg total) by mouth 2 (two) times daily. 02/02/19   Medina-Vargas, Monina C, NP  furosemide (LASIX) 40 MG tablet Take 40 mg by mouth daily.    [provider]  HYDROcodone-acetaminophen (NORCO/VICODIN) 5-325 MG tablet Take 1 tablet by mouth every 6 (six) hours as needed for moderate pain. 05/05/20   Persons, Bevely Palmer, PA  ondansetron (ZOFRAN ODT) 4 MG disintegrating tablet Take 1 tablet (4 mg total) by mouth every 8 (eight) hours as needed for nausea or  vomiting. 02/08/20   Long, Wonda Olds, MD  oxyCODONE-acetaminophen (PERCOCET) 5-325 MG tablet Take 1 tablet by mouth every 4 (four) hours as needed for severe pain. 04/14/20 04/14/21  Persons, Bevely Palmer, PA  polyethylene glycol (MIRALAX / GLYCOLAX) 17 g packet Take 17 g by mouth daily as needed. 09/03/19   Love, Ivan Anchors, PA-C  senna-docusate (SENOKOT-S) 8.6-50 MG tablet Take 2 tablets by mouth 2 (two) times daily. 09/03/19   Love, Ivan Anchors, PA-C    Allergies    Tramadol  Review of Systems   Review of Systems  Constitutional: Negative for chills, diaphoresis, fatigue and fever.  HENT: Negative for congestion, sore throat and trouble swallowing.   Eyes: Negative for pain and visual disturbance.  Respiratory: Negative for cough, chest tightness, shortness of breath and wheezing.   Cardiovascular: Negative for chest pain, palpitations and leg swelling.  Gastrointestinal: Negative for abdominal distention, abdominal pain, diarrhea, nausea and vomiting.  Genitourinary: Negative for difficulty urinating.  Musculoskeletal: Negative for back pain, neck pain and neck stiffness.  Skin: Positive for wound (Ulcers). Negative for pallor.  Neurological: Positive for weakness. Negative for dizziness, tremors, seizures, syncope, facial asymmetry, speech difficulty, light-headedness, numbness and headaches.  Psychiatric/Behavioral: Negative for confusion.    Physical Exam Updated Vital Signs BP (!) 88/61 (BP Location: Right Arm)    Pulse 79    Temp (!) 96.6 F (35.9 C) (Rectal)    Resp 18    Ht 5\' 7"  (1.702 m)    Wt 102.1 kg    LMP 11/21/2011    SpO2 98%    BMI 35.24 kg/m   Physical Exam Constitutional:      General: She is not in acute distress.    Appearance: Normal appearance. She is not ill-appearing, toxic-appearing or diaphoretic.  HENT:     Mouth/Throat:     Mouth: Mucous membranes are moist.     Pharynx: Oropharynx is clear.  Eyes:     General: No scleral icterus.    Extraocular Movements:  Extraocular movements intact.     Pupils: Pupils are equal, round, and reactive to light.  Cardiovascular:     Rate and Rhythm: Normal rate and regular rhythm.     Pulses: Normal pulses.     Heart sounds: Normal heart sounds.  Pulmonary:     Effort: Pulmonary effort is normal. No respiratory distress.     Breath sounds: Normal breath sounds.  No stridor. No wheezing, rhonchi or rales.  Chest:     Chest wall: No tenderness.  Abdominal:     General: Abdomen is flat. There is no distension.     Palpations: Abdomen is soft.     Tenderness: There is no abdominal tenderness. There is no guarding or rebound.  Musculoskeletal:        General: No swelling or tenderness. Normal range of motion.     Cervical back: Normal range of motion and neck supple. No rigidity.     Right lower leg: No edema.     Left lower leg: No edema.  Skin:    General: Skin is warm and dry.     Capillary Refill: Capillary refill takes less than 2 seconds.     Coloration: Skin is not pale.     Findings: Lesion (Two stage II lateral ulcers on bilaterall lower extremities.) present.     Comments: PT DP pulses 2+, normal cap refill.   Neurological:     General: No focal deficit present.     Mental Status: She is alert and oriented to person, place, and time.     Cranial Nerves: Cranial nerves are intact. No cranial nerve deficit, dysarthria or facial asymmetry.     Sensory: Sensation is intact.     Motor: No weakness, tremor, abnormal muscle tone or pronator drift.     Coordination: Coordination is intact.     Comments: Alert and oriented times 3.Clear speech. No facial droop. CNIII-XII grossly intact. Bilateral upper and lower extremities' sensation and strength grossly intact. 5/5 symmetric strength with grip strength and 4/5 with plantar and dorsi flexion bilaterally (This may due pain secondary to her ulcers).   Psychiatric:        Mood and Affect: Mood normal.        Behavior: Behavior normal.     ED Results /  Procedures / Treatments   Labs (all labs ordered are listed, but only abnormal results are displayed) Labs Reviewed  CULTURE, BLOOD (ROUTINE X 2)  CULTURE, BLOOD (ROUTINE X 2)  URINE CULTURE  SARS CORONAVIRUS 2 BY RT PCR (HOSPITAL ORDER, [facility])  TSH  LACTIC ACID, PLASMA  LACTIC ACID, PLASMA  COMPREHENSIVE METABOLIC PANEL  CBC WITH DIFFERENTIAL/PLATELET  APTT  PROTIME-INR  URINALYSIS, ROUTINE W REFLEX MICROSCOPIC  BLOOD GAS, ARTERIAL  I-STAT BETA HCG BLOOD, ED ([facility], [facility], [facility] ONLY)  I-STAT VENOUS BLOOD GAS, ED    EKG EKG Interpretation  Date/Time:  Friday June 10 2020 13:15:17 EDT Ventricular Rate:  82 PR Interval:    QRS Duration: 117 QT Interval:  411 QTC Calculation: 480 R Axis:   3 Text Interpretation: Sinus rhythm Multiform ventricular premature complexes Nonspecific intraventricular conduction delay Nonspecific T abnormalities, lateral leads No significant change since last tracing Confirmed by Dorie Rank [phone]) on 06/03/2020 1:30:40 PM   Radiology No results found.  Procedures Procedures (including critical care time)  Medications Ordered in ED Medications  vancomycin (VANCOCIN) IVPB 1000 mg/200 mL premix (has no administration in time range)  ceFEPIme (MAXIPIME) 2 g in sodium chloride 0.9 % 100 mL IVPB (has no administration in time range)    ED Course  I have reviewed the triage vital signs and the nursing notes.  Pertinent labs & imaging results that were available during my care of the patient were reviewed by me and considered in my medical decision making (see chart for details).  Clinical Course as of Jun 10 1752  Fri Jun 10, 2020  1339 Patient hypotensive 88/61 with temperature 94.2.  Initiated code sepsis.  Started warm IV fluids and Bair hugger.   [SP]  5366 Patient with a systolic in the 44I, will continue to monitor and consider pressors if patient does not respond to fluids.   [SP]  3474 Pt evaluated at handoff.  She is alert and conversant. BP 94 systolic. Reports generalized weakness over the course of the last 2 weeks.  She also had some vomiting at home over the last 2 days.  Last bowel movement was 3 days ago.  No associated abdominal pain.  She denies fevers, respiratory complaints, chest pain, palpitations, urinary symptoms.  She is followed by Dr. 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  She has not been able to follow-up this week due to feeling ill.  No known sick contacts.  Labs pending, BP is improving with IV fluids.  We will continue to monitor.   [JR]    Clinical Course User Index [JR] Robinson, [place] N, PA-C [SP] Alfredia Client, PA-C   MDM Rules/Calculators/A&P                         KILIE RUND is a 59 y.o. female with pertinent past medical history of asthma, CHF, hyperlipidemia, hypertension, sarcoidosis, history of MI, CKD stage III that presents emergency department today for weakness.  Patient without fall, or LOC.  Patient was on the floor for 14 hours.  Denies any pain anywhere.  Normal health yesterday Patient is sitting comfortably in bed, does not appear to be in acute respiratory distress, mentating well..  Denies any pain anywhere.  Will work-up patient as code sepsis since patient is hypotensive and hypothermic. Possible source from ulcers. Fluids and IV abx started. Dr. Tomi Bamberger aware. EKG without signs of ischemia. Grossly normal neuro exam.  Pt care was handed off to J. Robinson PA-C at 335  Complete history and physical and current plan have been communicated.  Please refer to their note for the remainder of ED care and ultimate disposition.  Patient continued to have hypotension, did speak to Dr. Tomi Bamberger about this recommended continue fluids bolus and pressors if patient does not respond.  This was discussed with oncoming team.  No labs have resulted.  Patient is mentating well and still not continuing of any pain.  This chart  was discussed with Dr. Tomi Bamberger who agreed with the care and disposition of the patient.   Final Clinical Impression(s) / ED Diagnoses Final diagnoses:  None    Rx / DC Orders ED Discharge Orders    None       Alfredia Client, PA-C 06/17/2020 Johnnye Lana    Dorie Rank, MD 06/20/20 [phone]

## 2020-06-10 NOTE — H&P (Addendum)
[facility] Admission History and Physical Service Pager: [phone]  Patient name: Kari [name] Medical record number: [id number] Date of birth: 06-30-1961 Age: 59 y.o. Gender: female  Primary Care Provider: Rutherford Guys, MD Consultants: None Code Status: Full Preferred Emergency Contact: Auriella Wieand, Daughter, [phone]  Chief Complaint: Weakness  Assessment and Plan: Kari [name] is a 59 y.o. female presenting with weakness meeting SIRS criteria. PMH is significant for CHF, HTN, HLD,hx of MI, CKD stage III, asthma, presumed sarcoidosis, ovarian mass, pre-diabetes.  Weakness, SIRS Patient presented to the emergency room with weakness which worsened over the last 24 hours.  She reportedly spent 14 hours in the floor until her sister arrived and called EMS.  She was reportedly walking to her bathroom when she became so weak that she sat down and was unable to get up.  On arrival to the ED the code sepsis was called due to patient's hypotension and hypothermia 96.6 F. Lab work was significant for lactic acid 2.0, TSH 6.189, WBC 12.0, hemoglobin 11.5, potassium 2.8, creatinine 2.40, albumin 1.4.  UA showed hazy urine with protein 30, few bacteria.  Chest x-ray showed no acute disease. Initial EKG showed sinus rhythm with PVCs, repeat EKG showed right bundle branch block with prolonged QTc  of 561.  A repeat EKG was ordered.  Patient denies any chest pain.  Blood cultures and urine cultures were collected, the patient was put under a warming blanket.  Potassium was repleted with 10 mEq x3 and IV fluids were started at 30 cc/kg.  CCM was consulted who recommended 1 L of 5% albumin and another liter of crystalloid.  If her blood pressure improved she could go to stepdown.  Blood pressures improved to 90s/50s and family medicine was consulted for admission.  On evaluation the patient was sitting comfortably in bed.  Blood pressures were 80s/50s with maps  in the 60s.  Physical exam was significant for lower extremity wounds bilaterally (pictures in media), periumbilical hernia which is mildly tender to palpation but was soft with no overlying skin changes.  Tacky mucous membranes.  No lower extremity pitting edema noted.  No sacral ulcer present.  Lungs were clear to auscultation bilaterally.  While evaluating the patient the blood pressures cycled with systolics in the 74Y and maps in the 50s.  We continued to speak to the patient and blood pressures improved to map of 63.  Unclear etiology for this presentation.  Hypoalbuminemia most likely due to malnutrition per patient's report that she did not eat or drink her normal amounts over the past 2 weeks.  Regarding source of infection the urine had a few bacteria and that but was negative for nitrites and leuk esterase.  There were 0-5 squamous epithelium which is reassuring that this was a clean-catch.  Patient's lower extremity wounds seem to be healing and do not appear infected with no warmth or edema around them.  Chest x-ray was negative for any acute findings.  Blood cultures and urine cultures are pending at this time so we will follow up on them.  CCM had noted that patient's presentation seemed to be caused by intravascular volume depletion.  Cause of this would be unclear, as patient has been taking lasix as prescribed, denies taking more than usual, but has been eating and drinking less.  Could consider intra-abdominal infection given she has intermittent abdominal pain and mild diffuse tenderness on examination, but this is much less likely.  At this time, it  remains unclear if this is infection vs solely hypovolemia, but WBC and hypothermia do lend more to this picture.  For now, will continue vanc/cefepime and attempt to support patient's blood pressure.  After admission, her Bps remained low and CCM was re-consulted, they reported that albumin was supposed to have been run open, therefore to do this,  then recheck BP 30-60 min afterwards and if still low, she would need pressor support.  Of note, patient has had multiple admissions with a similar picture of weakness and hypotension. -Admit to progressive with Dr. Owens Shark as attending  -Every 2 vital signs, with Bps q30 min -Consider CCM consult if MAP below 60 -Continuous cardiac monitoring -Maintenance IV fluids at 140 mL/h for 10 h, monitor closely for volume overload - albumin per CCM instructions and recheck BP 30-60 min after finishes -Follow-up on blood cultures and urine cultures -Continuous pulse ox -Strict I's and O's -Continue vancomycin per pharmacy (6/18- ) -Continue cefepime per pharmacy (6/18- ) -PT/OT eval and treat -Morning CBC -Morning CMP -Urine protein creatinine -Consider morning EKG pending results of repeat EKG.  Hypoalbuminemia Albumin 1.4 on admission.  LFTs WNL, no history of liver dysfunction.  Patient has presumed sarcoidosis, but not biopsy confirmed, therefore doubt this would be causing liver dysfunction, and as previously stated, patient has normal LFTs.  Did have 30 protein on urine dipstick.  She does not have edema on examination, so nephrotic syndrome is less likely, but will check urine protein to further quantify.  If elevated, could consider 24hr urine protein collection.  Heart failure can also cause hypoalbuminemia, and can also be seen with increase in Bili, which patient has.  Could also consider malnutrition as a cause given she reports that she has not been eating well the last few weeks.  It does appear that this is a chronic issue for her, but 1.4 is lowest level in chart.  Most recently in 12/2019, was 3.5.   - patient receiving albumin per above - consider nutrition consult when patient improves  CHF  History of MI  nonischemic cardiomyopathy Most recent echo 12/02/2019 showed LVEF of 45% with mildly decreased function.  No LV hypertrophy.  Grade 1 diastolic dysfunction.  Patient seems dry on  admission evaluation.  She received a total of 5 L of fluid in the ED and was started on albumin.  Patient had a cardiac arrest in 2018.  Echo at that time showed LVEF of 40 to 45%.  Cardiac catheterization was done showing normal coronary arteries.  Patient had ICD implant procedure on that admission.  Home medications include carvedilol 6.25 mg twice daily, Lasix 40 mg daily..  She is not on ACE/ARB per cardiology due to soft blood pressures. Follows with Dr. Harrington Challenger.  -Holding home Lasix due to AKI and hypotension  Venous stasis ulcers of lower extremities bilateral Being managed by Dr. Sharol Given outpatient.  Uses compression wraps with Iodosorb 4 x 4's and Ace's on both lower extremities.  See images for examination on admission.  Wounds appear to be healing well without evidence of infection. - wound care consult  Elevated TSH TSH on admission elevated to 6.189, Free T4 0.65.  Likely elevated in setting of acute illness. - recheck TSH as outpatient  HTN Patient has remained hypotensive since admission with blood pressures ranging from 51/42-120/107.  On evaluation patient's blood pressures ranged from 60s/40s to 80s/50s. -Vitals every 2 hours, BPs every 30 min -If maps remain below 65 consult CCM because she may require  pressors -Holding carvedilol at this time   AKI with CKD stage III Baseline creatinine is 1.1-1.3.  Creatinine on arrival today was 2.40, most likely perfusion related.  Patient has received 5 L of fluid since arrival.  Follows with [facility] as outpatient, states that she missed her most recent appointment in May due to not feeling well, but prior to this was told that her kidneys were doing well. -Morning BMPs -Holding home Lasix -Avoid nephrotoxic agents -Consider nephrology consult if creatinine worsens  HLD Most recent lipid panel 12/31/2019 showed total cholesterol 198, HDL 47, LDL 131, triglycerides 112, VLDL 20.  Patient is not currently on a statin. -Continue to  manage outpatient  Asthma Home medication includes albuterol as needed for shortness of breath. -Continue albuterol as needed  Sarcoidosis Patient seen by pulmonology in 2014.  She had mediastinal lymphadenopathy in his hands consistent with sarcoidosis but has had no biopsies confirming sarcoid.  Pulmonology said that from a pulmonary standpoint she was asymptomatic so there was no need for treatment for the sarcoidosis.  Gout Patient takes allopurinol 100 mg twice daily.  Given that her GFR is now 25, this should be dose adjusted to 50mg  QOD.  Consider also dose adjusting on discharge to 50mg  QD as her GFR baseline appears to be <60.  No sign of acute gout on admission. -allopurinol 50mg  QOD  Ovarian Mass Patient has history of known ovarian mass.   - f/u outpatient  Pre-diabetes Last A1c 5.8 on 08/20/2019.   - encourage proper diet and exercise as outpatient  Umbilical Hernia Patient with umbilical hernia on exam, no overlying skin changes, mildly TTP diffusely, but is soft.  She reports occasional abdominal pain that is chronic and comes and goes, but denies any at presents.  She reports "a little" constipation, but has still been having BMs.  NO blood in stool.  Low suspicion for incarceration at this time, but will monitor closely and consider CT Abd/Pelvis if develops pain. - continue to monitor closely  FEN/GI: Heart healthy Prophylaxis: Lovenox  Disposition: Admit to progressive but may require critical care  History of Present Illness:  Kari [name] is a 59 y.o. female presenting with 2 weeks of progressive fatigue and generalized weakness with worsening 1 day ago.  Patient reports that she was coming out of the bathroom last night around 11PM when she felt very weak and then laid down because she felt like she was too weak to move any more.  This AM, her sister came and found her at between 73 and 1 PM.  Denied falling, stated that she sat down herself.  Admits that she  has not been eating or drinking well over the past 2 weeks because she is felt "crummy".  She is unsure of why she feels so poorly.  Denies any sick symptoms such as fever, cough, congestion, runny nose, pain with urination, diarrhea.  States that she has been "a little constipated" and her stomach hurts off and on "a little."  Has chronic venous stasis ulcers BLE, follows with Dr. Sharol Given, and states that she feels like her wounds are improving.  Denies increased redness or pain in the area.  Denies BLE edema.      Review Of Systems: Per HPI with the following additions:   Review of Systems  Constitutional: Positive for activity change, appetite change and fatigue. Negative for fever.  HENT: Negative for congestion, facial swelling, rhinorrhea and sore throat.   Eyes: Negative for visual disturbance.  Respiratory: Negative for cough and shortness of breath.   Cardiovascular: Negative for chest pain and leg swelling.  Gastrointestinal: Positive for abdominal pain and constipation. Negative for diarrhea, nausea and vomiting.  Genitourinary: Negative for dysuria, frequency, hematuria and urgency.  Skin: Positive for wound (BLE chronic venous stasis ulcers). Negative for rash.  Neurological: Positive for weakness. Negative for dizziness and headaches.       No focal weakness     Patient Active Problem List   Diagnosis Date Noted  . Multiple open wounds of lower leg   . Moderate protein-calorie malnutrition ([place])   . Non-healing ulcer ([place]) 08/19/2019  . Postoperative pain   . Labile blood pressure   . Nonischemic cardiomyopathy ([place])   . Physical debility 07/20/2019  . Idiopathic chronic venous hypertension of lower extremity with ulcer, bilateral ([place]) 07/15/2019  . H/O skin graft 05/13/2019  . CKD (chronic kidney disease), stage III 01/13/2019  . Infected wound 12/26/2018  . Idiopathic chronic venous hypertension of both lower extremities with ulcer and inflammation ([place])   . History  of fall 05/10/2018  . Hyperkalemia 02/21/2018  . Hypotension 02/21/2018  . Sepsis ([place]) 02/21/2018  . Encounter for central line placement   . Mitral regurgitation 10/30/2017  . Acute kidney injury ([place]) 07/21/2017  . Generalized weakness 07/20/2017  . Shock circulatory ([place])   . Acute respiratory failure with hypoxia ([place])   . Pulmonary edema   . Cardiac arrest ([place]) 06/26/2017  . Ventricular fibrillation ([place])   . Acute on chronic diastolic heart failure ([place])   . Abnormal ECG   . Cellulitis 04/02/2017  . AKI (acute kidney injury) ([place]) 12/17/2016  . Cellulitis and abscess of right lower extremity 12/17/2016  . Syncope and collapse 12/17/2016  . Peripheral edema 12/17/2016  . Hypokalemia 12/17/2016  . Anemia of chronic disease 12/17/2016  . Acute diastolic CHF (congestive heart failure) ([place]) 03/14/2014  . CHF (congestive heart failure) ([place]) 03/12/2014  . Sarcoidosis ([place]) 03/12/2014  . Severe sepsis ([place]) 10/17/2013  . ARF (acute renal failure) ([place]) 10/08/2013  . Cellulitis of multiple sites of lower extremity 10/08/2013  . Morbid obesity ([place]) 10/08/2013  . Volume depletion 10/08/2013  . Venous (peripheral) insufficiency 10/28/2012  . Mediastinal lymphadenopathy 10/16/2012  . Pulmonary nodules 10/16/2012  . 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 Chest pain 09/02/2012  . Asthma   . Hypertension   . Depression   . Venous stasis ulcers ([place]) 07/29/2012  . Varicose veins of lower extremities with ulcer ([place]) 07/08/2012  . Venous ulcer of leg ([place]) 07/08/2012    Past Medical History: Past Medical History:  Diagnosis Date  . Arthritis   . Asthma   . CHF (congestive heart failure) ([place]) 03/12/2014   a. EF 40-45% by echo in 06/2017 with normal cors by cath. ICD placed following VT arrest  . Depression   . Headache(784.0)   . History of blood transfusion   . Hyperlipidemia   . Hypertension   . Lung nodules   .  Myocardial infarction ([place]) 06/26/2014  . Renal disorder    followed by [facility]  . Sarcoidosis   . Ulcer    recurring, from chronic venous insufficiency    Past Surgical History: Past Surgical History:  Procedure Laterality Date  . cataract surgery Left 07-2013  . I & D EXTREMITY Bilateral 12/31/2018   Procedure: DEBRIDEMENT BILATERAL LEGS, APPLY VAC X 2;  Surgeon: [name], MD;  Location: [place];  Service: Orthopedics;  Laterality: Bilateral;  . I & D EXTREMITY Bilateral 01/02/2019   Procedure: REPEAT DEBRIDEMENT BILATERAL LEGS, APPLY VAC X 2;  Surgeon: [name], MD;  Location: [place];  Service: Orthopedics;  Laterality: Bilateral;  . I & D EXTREMITY Bilateral 07/15/2019   Procedure: IRRIGATION AND DEBRIDEMENT VENOUS STASIS INSUFFICIENCY ULCERATIONS BILATERAL LOWER EXTREMITIES;  Surgeon: [name], MD;  Location: [place];  Service: Orthopedics;  Laterality: Bilateral;  . I & D EXTREMITY Bilateral 08/23/2019   Procedure: DEBRIDEMENT EXTREMITY;  Surgeon: [name], MD;  Location: [place];  Service: Orthopedics;  Laterality: Bilateral;  . ICD IMPLANT N/A 07/05/2017   Procedure: ICD Implant;  Surgeon: Constance Haw, MD;  Location: [place] [facility];  Service: Cardiovascular;  Laterality: N/A;  . LEFT HEART CATH AND CORONARY ANGIOGRAPHY N/A 07/03/2017   Procedure: Left Heart Cath and Coronary Angiography;  Surgeon: Belva Crome, MD;  Location: [place] [facility];  Service: Cardiovascular;  Laterality: N/A;  . SKIN SPLIT GRAFT Bilateral 01/07/2019   Procedure: SPLIT THICKNESS SKIN GRAFT BILATERAL LEGS, APPLY VAC;  Surgeon: [name], MD;  Location: [place];  Service: Orthopedics;  Laterality: Bilateral;  . SKIN SPLIT GRAFT Bilateral 07/17/2019   Procedure: REPEAT IRRIGATION AND DEBRIDEMENT BILATERAL LOWER EXTREMITIES, SKIN GRAFT;  Surgeon: [name], MD;  Location: [place];  Service: Orthopedics;  Laterality: Bilateral;  . SKIN SPLIT GRAFT Bilateral 08/26/2019    Procedure: SKIN GRAFT SPLIT THICKNESS BILATERAL LEGS, APPLY WOUND VAC;  Surgeon: [name], MD;  Location: [place];  Service: Orthopedics;  Laterality: Bilateral;    Social History: Social History   Tobacco Use  . Smoking status: Never Smoker  . Smokeless tobacco: Never Used  Vaping Use  . Vaping Use: Never used  Substance Use Topics  . Alcohol use: No  . Drug use: No    Please also refer to relevant sections of EMR.  Family History: Family History  Problem Relation Age of Onset  . Heart failure Mother   . Diabetes Mellitus II Mother   . Hypertension Mother   . Cancer Mother        unknown type  . Heart disease Father   . Stroke Father   . Diabetes Mellitus II Father     Allergies and Medications: Allergies  Allergen Reactions  . Tramadol Other (See Comments)    hallucination   No current facility-administered medications on file prior to encounter.   Current Outpatient Medications on File Prior to Encounter  Medication Sig Dispense Refill  . acetaminophen (TYLENOL) 325 MG tablet Take 1-2 tablets (325-650 mg total) by mouth every 4 (four) hours as needed for mild pain.    Marland Kitchen albuterol (PROVENTIL HFA;VENTOLIN HFA) 108 (90 Base) MCG/ACT inhaler Inhale 2 puffs into the lungs every 6 (six) hours as needed for shortness of breath. 18 g 0  . allopurinol (ZYLOPRIM) 100 MG tablet TAKE 2 TABLETS (200 MG TOTAL) BY MOUTH DAILY. (Patient taking differently: Take 100 mg by mouth in the morning and at bedtime. ) 60 tablet 0  . Ascorbic Acid (VITAMIN C) 1000 MG tablet Take 1,000 mg by mouth daily.    Marland Kitchen aspirin EC 81 MG tablet Take 81 mg by mouth daily.    . carvedilol (COREG) 6.25 MG tablet Take 1 tablet (6.25 mg total) by mouth 2 (two) times daily. 60 tablet 0  . ferrous sulfate 325 (65 FE) MG EC tablet Take 325 mg by mouth 3 (  three) times daily with meals.    . furosemide (LASIX) 40 MG tablet Take 40 mg by mouth daily.    Marland Kitchen HYDROcodone-acetaminophen (NORCO/VICODIN) 5-325 MG  tablet Take 1 tablet by mouth every 6 (six) hours as needed for moderate pain. 30 tablet 0  . senna-docusate (SENOKOT-S) 8.6-50 MG tablet Take 2 tablets by mouth 2 (two) times daily. 120 tablet 0  . ondansetron (ZOFRAN ODT) 4 MG disintegrating tablet Take 1 tablet (4 mg total) by mouth every 8 (eight) hours as needed for nausea or vomiting. (Patient not taking: Reported on 06/06/2020) 20 tablet 0  . oxyCODONE-acetaminophen (PERCOCET) 5-325 MG tablet Take 1 tablet by mouth every 4 (four) hours as needed for severe pain. (Patient not taking: Reported on 06/13/2020) 20 tablet 0  . polyethylene glycol (MIRALAX / GLYCOLAX) 17 g packet Take 17 g by mouth daily as needed. (Patient not taking: Reported on 06/13/2020) 30 each 0    Objective: BP (!) 96/56   Pulse 74   Temp 97.7 F (36.5 C) (Oral)   Resp 17   Ht 5\' 7"  (1.702 m)   Wt 102.1 kg   LMP 11/21/2011   SpO2 98%   BMI 35.24 kg/m  Physical Exam Vitals reviewed.  Constitutional:      General: She is not in acute distress.    Comments: Appears tired  HENT:     Head: Normocephalic and atraumatic.     Nose: No congestion or rhinorrhea.     Mouth/Throat:     Mouth: Mucous membranes are dry.     Pharynx: No oropharyngeal exudate or posterior oropharyngeal erythema.  Eyes:     General: No scleral icterus.    Extraocular Movements: Extraocular movements intact.     Conjunctiva/sclera: Conjunctivae normal.     Pupils: Pupils are equal, round, and reactive to light.  Cardiovascular:     Rate and Rhythm: Normal rate and regular rhythm.     Pulses: Normal pulses.  Pulmonary:     Effort: Pulmonary effort is normal. No respiratory distress.     Breath sounds: Normal breath sounds. No wheezing.  Abdominal:     General: Bowel sounds are normal.     Comments: Periumbilical hernia noted which was mildly tender to palpation.  It was soft with no overlying skin changes  Musculoskeletal:     Cervical back: Normal range of motion and neck supple. No  tenderness.     Right lower leg: No edema.     Left lower leg: No edema.     Comments: Lower extremity wounds  Lymphadenopathy:     Cervical: No cervical adenopathy.  Skin:    General: Skin is warm and dry.     Findings: Lesion present.     Comments: Lower extremity wounds present being managed by Dr. Sharol Given outpatient.  See pictures below  Neurological:     General: No focal deficit present.     Mental Status: She is alert.     Cranial Nerves: No cranial nerve deficit.     Sensory: No sensory deficit.     Motor: No weakness.     Comments: Reflexes equal and within normal limits in upper extremities, difficult to elicit reflex in lower extremity  Psychiatric:        Mood and Affect: Mood normal.        Behavior: Behavior normal.         Addition to above: no sacral wound noted on examination  Labs and Imaging: CBC  Component Value Date/Time   WBC 12.0 (H) 05/27/2020 1332   RBC 3.83 (L) 06/08/2020 1332   HGB 11.5 (L) 06/19/2020 1332   HGB 9.7 (L) 02/11/2019 1420   HCT 35.8 (L) 05/27/2020 1332   HCT 29.2 (L) 02/11/2019 1420   PLT 212 05/25/2020 1332   PLT 335 02/11/2019 1420   MCV 93.5 05/31/2020 1332   MCV 88 02/11/2019 1420   MCH 30.0 06/02/2020 1332   MCHC 32.1 06/12/2020 1332   RDW 16.6 (H) 05/29/2020 1332   RDW 16.2 (H) 02/11/2019 1420   LYMPHSABS 1.1 06/04/2020 1332   LYMPHSABS 1.0 09/11/2018 1727   MONOABS 0.4 05/27/2020 1332   EOSABS 0.0 06/12/2020 1332   EOSABS 0.3 09/11/2018 1727   BASOSABS 0.0 06/17/2020 1332   BASOSABS 0.0 09/11/2018 1727   CMP Latest Ref Rng & Units 06/09/2020 02/08/2020 12/31/2019  Glucose 70 - 99 mg/dL 100(H) 125(H) 94  BUN 6 - 20 mg/dL 66(H) 17 18  Creatinine 0.44 - 1.00 mg/dL 2.40(H) 1.34(H) 1.17(H)  Sodium 135 - 145 mmol/L 143 138 140  Potassium 3.5 - 5.1 mmol/L 2.8(L) 3.4(L) 3.9  Chloride 98 - 111 mmol/L 106 102 102  CO2 22 - 32 mmol/L 25 26 23   Calcium 8.9 - 10.3 mg/dL 7.5(L) 9.0 9.4  Total Protein 6.5 - 8.1 g/dL 5.5(L) -  7.5  Total Bilirubin 0.3 - 1.2 mg/dL 1.8(H) - 0.7  Alkaline Phos 38 - 126 U/L 81 - 82  AST 15 - 41 U/L 20 - 10  ALT 0 - 44 U/L 16 - 7   Albumin 1.4  EKG:  Initial EKG showed sinus rhythm with PVCs, repeat EKG showed right bundle branch block with prolonged QTc  of 561.  Results for Kari [name], Kari [name] (MRN [id number]) as of 06/21/2020 23:18  Ref. Range 06/21/2020 17:40  URINALYSIS, ROUTINE W REFLEX MICROSCOPIC Unknown Rpt (A)  Appearance Latest Ref Range: CLEAR  HAZY (A)  Bilirubin Urine Latest Ref Range: NEGATIVE  NEGATIVE  Color, Urine Latest Ref Range: YELLOW  AMBER (A)  Glucose, UA Latest Ref Range: NEGATIVE mg/dL NEGATIVE  Hgb urine dipstick Latest Ref Range: NEGATIVE  NEGATIVE  Ketones, ur Latest Ref Range: NEGATIVE mg/dL NEGATIVE  Leukocytes,Ua Latest Ref Range: NEGATIVE  NEGATIVE  Nitrite Latest Ref Range: NEGATIVE  NEGATIVE  pH Latest Ref Range: 5.0 - 8.0  5.0  Protein Latest Ref Range: NEGATIVE mg/dL 30 (A)  Specific Gravity, Urine Latest Ref Range: 1.005 - 1.030  1.019  Bacteria, UA Latest Ref Range: NONE SEEN  FEW (A)  Hyaline Casts, UA Unknown PRESENT  Mucus Unknown PRESENT  RBC / HPF Latest Ref Range: 0 - 5 RBC/hpf 0-5  Squamous Epithelial / LPF Latest Ref Range: 0 - 5  0-5  WBC, UA Latest Ref Range: 0 - 5 WBC/hpf 0-5   Lactic acid 2.0 TSH 6.189 T4 0.65 CK 96 Magnesium 2.3 PT/INR 16.1/1.3 APTT 27  EKG normal sinus with incomplete RBBB and T wave inversion in leads except I, aVR, and aVL  Gifford Shave, MD 05/28/2020, 8:47 PM PGY-1, [place] [facility] pager: [phone], text pages welcome  FPTS Upper-Level Resident Addendum   I have independently interviewed and examined the patient. I have discussed the above with the original author and agree with their documentation. My edits for correction/addition/clarification are in green. Please see also any attending notes.   [place] Constable, D.O. PGY-2, [facility] 06/05/2020 11:48 PM  [facility] pager: [phone] (text pages welcome  through [facility])

## 2020-06-10 NOTE — ED Provider Notes (Signed)
Care assumed at shift change after initial evaluation and lab work ordered.  Patient presenting with progressively worsening weakness and malaise over the last 2 weeks.  She states she slept on the floor overnight due to worsening weakness.  On presentation to the ED, she is hypothermic and hypotensive, with normal heart rate.  Mentating appropriately.  Sepsis protocol initiated.  Physical Exam  BP (!) 96/56   Pulse 74   Temp 97.7 F (36.5 C) (Oral)   Resp 17   Ht 5\' 7"  (1.702 m)   Wt 102.1 kg   LMP 11/21/2011   SpO2 98%   BMI 35.24 kg/m   Physical Exam Vitals and nursing note reviewed.  Constitutional:      General: She is not in acute distress.    Appearance: She is well-developed.  HENT:     Head: Normocephalic and atraumatic.  Eyes:     Conjunctiva/sclera: Conjunctivae normal.  Cardiovascular:     Rate and Rhythm: Normal rate and regular rhythm.  Pulmonary:     Effort: Pulmonary effort is normal. No respiratory distress.     Breath sounds: Normal breath sounds.  Abdominal:     General: Bowel sounds are normal.     Palpations: Abdomen is soft.     Tenderness: There is no abdominal tenderness.  Skin:    General: Skin is warm.     Comments: Chronic appearing ulcers to bilateral lateral lower legs.  No obvious purulent drainage or significant surrounding erythema..  There are multiple small staples present to the base of the wound.  Neurological:     Mental Status: She is alert and oriented to person, place, and time.  Psychiatric:        Behavior: Behavior normal.    Results for orders placed or performed during the hospital encounter of 06/13/2020  TSH  Result Value Ref Range   TSH 6.189 (H) 0.350 - 4.500 uIU/mL  Lactic acid, plasma  Result Value Ref Range   Lactic Acid, Venous 2.0 (HH) 0.5 - 1.9 mmol/L  Comprehensive metabolic panel  Result Value Ref Range   Sodium 143 135 - 145 mmol/L   Potassium 2.8 (L) 3.5 - 5.1 mmol/L   Chloride 106 98 - 111 mmol/L   CO2 25  22 - 32 mmol/L   Glucose, Bld 100 (H) 70 - 99 mg/dL   BUN 66 (H) 6 - 20 mg/dL   Creatinine, Ser 2.40 (H) 0.44 - 1.00 mg/dL   Calcium 7.5 (L) 8.9 - 10.3 mg/dL   Total Protein 5.5 (L) 6.5 - 8.1 g/dL   Albumin 1.4 (L) 3.5 - 5.0 g/dL   AST 20 15 - 41 U/L   ALT 16 0 - 44 U/L   Alkaline Phosphatase 81 38 - 126 U/L   Total Bilirubin 1.8 (H) 0.3 - 1.2 mg/dL   GFR calc non Af Amer 22 (L) >60 mL/min   GFR calc Af Amer 25 (L) >60 mL/min   Anion gap 12 5 - 15  CBC WITH DIFFERENTIAL  Result Value Ref Range   WBC 12.0 (H) 4.0 - 10.5 K/uL   RBC 3.83 (L) 3.87 - 5.11 MIL/uL   Hemoglobin 11.5 (L) 12.0 - 15.0 g/dL   HCT 35.8 (L) 36 - 46 %   MCV 93.5 80.0 - 100.0 fL   MCH 30.0 26.0 - 34.0 pg   MCHC 32.1 30.0 - 36.0 g/dL   RDW 16.6 (H) 11.5 - 15.5 %   Platelets 212 150 - 400 K/uL  nRBC 0.0 0.0 - 0.2 %   Neutrophils Relative % 88 %   Neutro Abs 10.6 (H) 1.7 - 7.7 K/uL   Lymphocytes Relative 9 %   Lymphs Abs 1.1 0.7 - 4.0 K/uL   Monocytes Relative 3 %   Monocytes Absolute 0.4 0 - 1 K/uL   Eosinophils Relative 0 %   Eosinophils Absolute 0.0 0 - 0 K/uL   Basophils Relative 0 %   Basophils Absolute 0.0 0 - 0 K/uL   WBC Morphology See Note    nRBC 0 0 /100 WBC   Abs Immature Granulocytes 0.00 0.00 - 0.07 K/uL  APTT  Result Value Ref Range   aPTT 27 24 - 36 seconds  Protime-INR  Result Value Ref Range   Prothrombin Time 16.1 (H) 11.4 - 15.2 seconds   INR 1.3 (H) 0.8 - 1.2  Urinalysis, Routine w reflex microscopic  Result Value Ref Range   Color, Urine AMBER (A) YELLOW   APPearance HAZY (A) CLEAR   Specific Gravity, Urine 1.019 1.005 - 1.030   pH 5.0 5.0 - 8.0   Glucose, UA NEGATIVE NEGATIVE mg/dL   Hgb urine dipstick NEGATIVE NEGATIVE   Bilirubin Urine NEGATIVE NEGATIVE   Ketones, ur NEGATIVE NEGATIVE mg/dL   Protein, ur 30 (A) NEGATIVE mg/dL   Nitrite NEGATIVE NEGATIVE   Leukocytes,Ua NEGATIVE NEGATIVE   RBC / HPF 0-5 0 - 5 RBC/hpf   WBC, UA 0-5 0 - 5 WBC/hpf   Bacteria, UA FEW (A)  NONE SEEN   Squamous Epithelial / LPF 0-5 0 - 5   Mucus PRESENT    Hyaline Casts, UA PRESENT   CK  Result Value Ref Range   Total CK 96 38.0 - 234.0 U/L  Magnesium  Result Value Ref Range   Magnesium 2.3 1.7 - 2.4 mg/dL  T4, free  Result Value Ref Range   Free T4 0.65 0.61 - 1.12 ng/dL   DG Chest Port 1 View  Result Date: 06/17/2020 CLINICAL DATA:  Code sepsis.  Weakness. EXAM: PORTABLE CHEST 1 VIEW COMPARISON:  02/21/2018 FINDINGS: The heart size and pulmonary vascularity are normal and the lungs are clear. AICD in place. No effusions. No bone abnormality. IMPRESSION: No acute disease. Electronically Signed   By: Lorriane Shire M.D.   On: 06/19/2020 14:40    ED Course/Procedures   Clinical Course as of Jun 11 2115  Fri Jun 10, 2020  1339 Patient hypotensive 88/61 with temperature 94.2.  Initiated code sepsis.  Started warm IV fluids and Bair hugger.   [SP]  7510 Patient with a systolic in the 25E, will continue to monitor and consider pressors if patient does not respond to fluids.   [SP]  5277 Pt evaluated at handoff. She is alert and conversant. BP 94 systolic. Reports generalized weakness over the course of the last 2 weeks.  She also had some vomiting at home over the last 2 days.  Last bowel movement was 3 days ago.  No associated abdominal pain.  She denies fevers, respiratory complaints, chest pain, palpitations, urinary symptoms.  She is followed by Dr. 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  She has not been able to follow-up this week due to feeling ill.  No known sick contacts.  Labs pending, BP is improving with IV fluids.  We will continue to monitor.   [JR]  1900 Leukocytosis of 12.  AKI with creatinine of 2.4.  Hypokalemia is present at 2.8,  magnesium was normal limits.  CK is within normal limits.  Potassium replacement ordered.  UA without obvious signs of infection.   [JR]  1900 TSH is elevated at 6.2.  Will obtain free  T4 to evaluate for possible myxedema.  No change in blood pressure at this time.   [JR]  1930 Pt BP remains 80 systolic, she has received all bu tabout 300cc of the 30cc/kg IVF resuscitation as well as steroid. Will consult CC to determine if we should start pressors. Pt continues to mentate appropriately.   [JR]  1945 Consulted with critical care, recommends patient is likely volume depleted intravascularly.  Recommends 1 L of 5% albumin, another liter of crystalloid.  If blood pressure does not improve, to call back.  If improving, patient appropriate for stepdown bed.   [JR]  2000 Nurse rechecked BP while hanging fluids, and is improved to 96/56.  Prior to having albumin, believe with improving blood pressure, she is appropriate for stepdown.  Unassigned consulted for admission.   [JR]  2020 Family Medicine accepting admission.   [JR]    Clinical Course User Index [JR] Christyana Corwin, [place] N, PA-C [SP] Alfredia Client, PA-C    .Critical Care Performed by: Budd Freiermuth, [place] N, PA-C Authorized by: Zarayah Lanting, [place] N, PA-C   Critical care provider statement:    Critical care time (minutes):  60   Critical care time was exclusive of:  Separately billable procedures and treating other patients and teaching time   Critical care was necessary to treat or prevent imminent or life-threatening deterioration of the following conditions:  Circulatory failure, shock and endocrine crisis   Critical care was time spent personally by me on the following activities:  Discussions with consultants, evaluation of patient's response to treatment, examination of patient, ordering and performing treatments and interventions, ordering and review of laboratory studies, ordering and review of radiographic studies, pulse oximetry, re-evaluation of patient's condition, obtaining history from patient or surrogate and review of old charts         Justyce Yeater, [place] N, PA-C 06/04/2020 2116    Sherwood Gambler,  MD 06/11/20 [phone]

## 2020-06-10 NOTE — Progress Notes (Signed)
Pharmacy Antibiotic Note  Kari [name] is a 59 y.o. female admitted on 06/03/2020 with sepsis.  Pharmacy has been consulted for cefepime and vancomycin dosing. Possible source bilateral foot ulcers.   Creatinine elevated from baseline, today at 2.4 (eCrCl ~31 ml/min). WBC count elevated at 12.0. Patient is afebrile, last temperature 97.7. Low blood pressure. First time doses of vancomycin 2 grams IV and cefepime 2 grams IV given around 1500 today. No culture data yet.   Plan: Vancomycin 1250 IV every 24 hours.  Goal trough 15-20 mcg/mL.  Cefepime 2 grams IV every 12 hours Monitor renal function, clinical status Follow up de-escalation and LOT  Height: 5\' 7"  (170.2 cm) Weight: 102.1 kg (225 lb) IBW/kg (Calculated) : 61.6  Temp (24hrs), Avg:96.2 F (35.7 C), Min:94.2 F (34.6 C), Max:97.7 F (36.5 C)  Recent Labs  Lab 06/07/2020 1332 06/15/2020 1625  WBC 12.0*  --   CREATININE 2.40*  --   LATICACIDVEN  --  2.0*    Estimated Creatinine Clearance: 31.4 mL/min (A) (by C-G formula based on SCr of 2.4 mg/dL (H)).    Allergies  Allergen Reactions  . Tramadol Other (See Comments)    hallucination    Antimicrobials this admission: vancomycin 6/18 >>  cefepime 6/18 >>   Dose adjustments this admission: N/A  Microbiology results: 6/18 BCx: sent 6/18 UCx: sent    Thank you,   Eddie Candle, PharmD PGY-1 Pharmacy Resident   Please check amion for clinical pharmacist contact number  06/19/2020 6:10 PM

## 2020-06-10 NOTE — ED Triage Notes (Signed)
Pt arrives via EMS from home with complaints of weakness. . Pt states she walks to the bathroom with her walker. This time pt got weak and could not make it. Pt sat down on floor and could not get back up. No LOC reported. Pt on floor about 14 hours. Pt alert and oriented X4

## 2020-06-10 NOTE — Progress Notes (Addendum)
FPTS Interim Progress Note  S:Patient now on floor, BP has decreased.  Seen and examined at bedside.  Patient denies complaints and states that she feels well, states that weakness has now improved.    O: BP (!) 73/56    Pulse 73    Temp 97.7 F (36.5 C) (Oral)    Resp (!) 23    Ht 5\' 7"  (1.702 m)    Wt 102.1 kg    LMP 11/21/2011    SpO2 98%    BMI 35.24 kg/m    General: lying comfortably in bed Resp: breathing comfortably on RA  A/P: Patient now s/p 5L IVF and has albumin hanging, which will be completed overnight Given that BPs continue to be <90 SBP, now in the 70s with MAP 63, will page CCM as patient would likely benefit from pressor support.  Update: Spoke with CCM Dr. Lucile Shutters, who states that albumin was supposed to be opened.  Advised to call nurse and advise to keep albumin open, then if BP still soft 30-60 min afterwards, to call back.  Collins Dimaria, Bernita Raisin, DO 06/13/2020, 11:19 PM PGY-2, [facility] pager [phone]

## 2020-06-11 ENCOUNTER — Inpatient Hospital Stay (HOSPITAL_COMMUNITY): Payer: Medicare Other

## 2020-06-11 ENCOUNTER — Encounter (HOSPITAL_COMMUNITY): Payer: Self-pay | Admitting: Pulmonary Disease

## 2020-06-11 DIAGNOSIS — I9589 Other hypotension: Secondary | ICD-10-CM

## 2020-06-11 LAB — BASIC METABOLIC PANEL
Anion gap: 12 (ref 5–15)
BUN: 65 mg/dL — ABNORMAL HIGH (ref 6–20)
CO2: 22 mmol/L (ref 22–32)
Calcium: 7.2 mg/dL — ABNORMAL LOW (ref 8.9–10.3)
Chloride: 111 mmol/L (ref 98–111)
Creatinine, Ser: 2.33 mg/dL — ABNORMAL HIGH (ref 0.44–1.00)
GFR calc Af Amer: 26 mL/min — ABNORMAL LOW (ref >60)
GFR calc non Af Amer: 22 mL/min — ABNORMAL LOW (ref >60)
Glucose, Bld: 82 mg/dL (ref 70–99)
Potassium: 2.4 mmol/L — CL (ref 3.5–5.1)
Sodium: 145 mmol/L (ref 135–145)

## 2020-06-11 LAB — CORTISOL-AM, BLOOD: Cortisol - AM: 43.7 ug/dL — ABNORMAL HIGH (ref 6.7–22.6)

## 2020-06-11 LAB — URINALYSIS, ROUTINE W REFLEX MICROSCOPIC
Bilirubin Urine: NEGATIVE
Glucose, UA: NEGATIVE mg/dL
Hgb urine dipstick: NEGATIVE
Ketones, ur: NEGATIVE mg/dL
Leukocytes,Ua: NEGATIVE
Nitrite: NEGATIVE
Protein, ur: 30 mg/dL — AB
Specific Gravity, Urine: 1.021 (ref 1.005–1.030)
pH: 5 (ref 5.0–8.0)

## 2020-06-11 LAB — NA AND K (SODIUM & POTASSIUM), RAND UR
Potassium Urine: 49 mmol/L
Sodium, Ur: <10 mmol/L

## 2020-06-11 LAB — COMPREHENSIVE METABOLIC PANEL
ALT: 11 U/L (ref 0–44)
AST: 12 U/L — ABNORMAL LOW (ref 15–41)
Albumin: 2 g/dL — ABNORMAL LOW (ref 3.5–5.0)
Alkaline Phosphatase: 50 U/L (ref 38–126)
Anion gap: 14 (ref 5–15)
BUN: 64 mg/dL — ABNORMAL HIGH (ref 6–20)
CO2: 18 mmol/L — ABNORMAL LOW (ref 22–32)
Calcium: 7.1 mg/dL — ABNORMAL LOW (ref 8.9–10.3)
Chloride: 114 mmol/L — ABNORMAL HIGH (ref 98–111)
Creatinine, Ser: 2.32 mg/dL — ABNORMAL HIGH (ref 0.44–1.00)
GFR calc Af Amer: 26 mL/min — ABNORMAL LOW (ref >60)
GFR calc non Af Amer: 22 mL/min — ABNORMAL LOW (ref >60)
Glucose, Bld: 80 mg/dL (ref 70–99)
Potassium: 2.7 mmol/L — CL (ref 3.5–5.1)
Sodium: 146 mmol/L — ABNORMAL HIGH (ref 135–145)
Total Bilirubin: 1.5 mg/dL — ABNORMAL HIGH (ref 0.3–1.2)
Total Protein: 4.9 g/dL — ABNORMAL LOW (ref 6.5–8.1)

## 2020-06-11 LAB — CBC
HCT: 27.3 % — ABNORMAL LOW (ref 36.0–46.0)
Hemoglobin: 8.6 g/dL — ABNORMAL LOW (ref 12.0–15.0)
MCH: 30.5 pg (ref 26.0–34.0)
MCHC: 31.5 g/dL (ref 30.0–36.0)
MCV: 96.8 fL (ref 80.0–100.0)
Platelets: 163 10*3/uL (ref 150–400)
RBC: 2.82 MIL/uL — ABNORMAL LOW (ref 3.87–5.11)
RDW: 16.8 % — ABNORMAL HIGH (ref 11.5–15.5)
WBC: 11.4 10*3/uL — ABNORMAL HIGH (ref 4.0–10.5)
nRBC: 0 % (ref 0.0–0.2)

## 2020-06-11 LAB — PROTEIN / CREATININE RATIO, URINE
Creatinine, Urine: 115.72 mg/dL
Protein Creatinine Ratio: 0.56 mg/mg{Cre} — ABNORMAL HIGH (ref 0.00–0.15)
Total Protein, Urine: 65 mg/dL

## 2020-06-11 LAB — GLUCOSE, CAPILLARY: Glucose-Capillary: 94 mg/dL (ref 70–99)

## 2020-06-11 LAB — ECHOCARDIOGRAM COMPLETE
Height: 67 in
Weight: 3781.33 oz

## 2020-06-11 LAB — MRSA PCR SCREENING: MRSA by PCR: NEGATIVE

## 2020-06-11 LAB — PROTIME-INR
INR: 1.5 — ABNORMAL HIGH (ref 0.8–1.2)
Prothrombin Time: 17.4 seconds — ABNORMAL HIGH (ref 11.4–15.2)

## 2020-06-11 LAB — RAPID URINE DRUG SCREEN, HOSP PERFORMED
Amphetamines: NOT DETECTED
Barbiturates: NOT DETECTED
Benzodiazepines: NOT DETECTED
Cocaine: NOT DETECTED
Opiates: NOT DETECTED
Tetrahydrocannabinol: NOT DETECTED

## 2020-06-11 LAB — LACTIC ACID, PLASMA: Lactic Acid, Venous: 0.9 mmol/L (ref 0.5–1.9)

## 2020-06-11 LAB — CK: Total CK: 58 U/L (ref 38–234)

## 2020-06-11 LAB — SEDIMENTATION RATE: Sed Rate: 14 mm/hr (ref 0–22)

## 2020-06-11 LAB — PROCALCITONIN: Procalcitonin: 7.44 ng/mL

## 2020-06-11 LAB — C-REACTIVE PROTEIN: CRP: 9.1 mg/dL — ABNORMAL HIGH (ref <1.0)

## 2020-06-11 LAB — TROPONIN I (HIGH SENSITIVITY)
Troponin I (High Sensitivity): 8 ng/L (ref <18)
Troponin I (High Sensitivity): 7 ng/L (ref <18)

## 2020-06-11 LAB — CREATININE, URINE, RANDOM: Creatinine, Urine: 96.76 mg/dL

## 2020-06-11 LAB — PREALBUMIN: Prealbumin: 5.8 mg/dL — ABNORMAL LOW (ref 18–38)

## 2020-06-11 MED ORDER — POTASSIUM CHLORIDE CRYS ER 20 MEQ PO TBCR
40.0000 meq | EXTENDED_RELEASE_TABLET | Freq: Once | ORAL | Status: AC
  Administered 2020-06-11: 40 meq via ORAL
  Filled 2020-06-11: qty 2

## 2020-06-11 MED ORDER — CHLORHEXIDINE GLUCONATE CLOTH 2 % EX PADS
6.0000 | MEDICATED_PAD | Freq: Every day | CUTANEOUS | Status: DC
  Administered 2020-06-11 – 2020-06-16 (×5): 6 via TOPICAL

## 2020-06-11 MED ORDER — NOREPINEPHRINE 4 MG/250ML-% IV SOLN
0.0000 ug/min | INTRAVENOUS | Status: DC
  Filled 2020-06-11: qty 250

## 2020-06-11 MED ORDER — ORAL CARE MOUTH RINSE
15.0000 mL | Freq: Two times a day (BID) | OROMUCOSAL | Status: DC
  Administered 2020-06-11 – 2020-06-14 (×7): 15 mL via OROMUCOSAL

## 2020-06-11 MED ORDER — POTASSIUM CHLORIDE CRYS ER 20 MEQ PO TBCR
40.0000 meq | EXTENDED_RELEASE_TABLET | Freq: Two times a day (BID) | ORAL | Status: DC
  Administered 2020-06-11 (×2): 40 meq via ORAL
  Filled 2020-06-11 (×2): qty 2

## 2020-06-11 NOTE — Progress Notes (Signed)
PT Cancellation Note  Patient Details Name: Kari [name] MRN: [id number] DOB: January 28, 1961   Cancelled Treatment:    Reason Eval/Treat Not Completed: Active bedrest order   Sandy Salaam Jamicia Haaland 06/11/2020, 8:01 AM  Bayard Males, PT Acute Rehabilitation Services Pager: [phone] Office: [phone]

## 2020-06-11 NOTE — Progress Notes (Signed)
Pt transferred to 75[facility] with all of her belongings. Report given and all questions answered.

## 2020-06-11 NOTE — Progress Notes (Signed)
  Echocardiogram 2D Echocardiogram has been performed.  Johny Chess 06/11/2020, 12:19 PM

## 2020-06-11 NOTE — Progress Notes (Signed)
Admitted from [facility] ED to [address] accompanied by RN,alert and oriented. Attached to continous cardiac monitoring CCMD notified,V/S checked.Patient oriented to the room and staff.Patient B/P is 14Q systolic and MD is aware.Will continue to closely monitor the patient.

## 2020-06-11 NOTE — Progress Notes (Signed)
OT Cancellation Note  Patient Details Name: Kari [name] MRN: [id number] DOB: 14-May-1961   Cancelled Treatment:    Reason Eval/Treat Not Completed: Active bedrest order. Will follow.  Malka So 06/11/2020, 10:11 AM  Nestor Lewandowsky, OTR/L Acute Rehabilitation Services Pager: [phone] Office: [phone]

## 2020-06-11 NOTE — H&P (Addendum)
NAME:  Kari [name], MRN:  [id number], DOB:  September 22, 1961, LOS: 1 ADMISSION DATE:  06/11/2020, CONSULTATION DATE:  06/11/20 REFERRING MD:  Gifford Shave, MD, CHIEF COMPLAINT:  Weak  Brief History   [name] is a 59 y.o. 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  She was down for 13-14 hours, until her sister came to check on her and found her on the floor.  The patient reports not eating or drinking well over the past 2 weeks and has been feeling progressively weaker.  She lives alone and normally walks with a cane.  She denies any fever, cough, congestion, runny nose, pain with urination or diarrhea.  She has chronic venous stasis ulcers BLE, follows with Dr. Sharol Given, and states that she feels like her wounds are improving and denies increased redness, pain or edema.   Upon arrival to the ED, code sepsis was called due to hypotension and hypothermia (96.6 F). Lactic acid 2.0, WBC 12.0, hemoglobin 11.5, potassium 2.8, creatinine 2.40, albumin 1.4.  UA showed hazy urine with protein 30, few bacteria.  Chest x-ray showed no acute disease. Initial EKG showed sinus rhythm with PVCs, repeat EKG showed right bundle branch block with prolonged QTc  of 561.  Potassium was repleted with 10 mEq x3 and IV fluids were started at 30 cc/kg, 1 L of 5% albumin and another liter of crystalloid.  History of present illness   BP again dropped with systolics in the 70J and MAP in the 50s despite aggressive volume resuscitation (5 liters IVF and Albumin 50gm). she was also given hydrocortisone 100mg  IV, and empiric  antibiotic- Vanc and Cefepime for presumed sepsis though no source identified.  PCCM consulted for admission to ICU.  The patient was seen bedside, and actually looks well.  She denies any dizziness, headache, stiff neck, chest pain, SOB, cough, nausea, vomiting, urinary symptoms, pain in her legs.  She reports vague abdominal discomfort.   Past Medical History   Past Medical History:  Diagnosis Date  . Arthritis   . Asthma   . CHF (congestive heart failure) ([place]) 03/12/2014   a. EF 40-45% by echo in 06/2017 with normal cors by cath. ICD placed following VT arrest  . Depression   . Headache(784.0)   . History of blood transfusion   . Hyperlipidemia   . Hypertension   . Lung nodules   . Myocardial infarction ([place]) 06/26/2014  . Renal disorder    followed by [facility]  . Sarcoidosis   . Ulcer    recurring, from chronic venous insufficiency   Significant Hospital Events     Consults:  PCCM  Procedures:  None  Significant Diagnostic Tests:  CXR: 06/03/2020 IMPRESSION:  No acute disease.  Micro Data:  SARS Coronavirus 2 - Negative Blood: pending Urine: pending  Antimicrobials:  Vancomycin Cefepime  Interim history/subjective:  C/o feeling weak  Objective   Blood pressure 98/83, pulse 78, temperature (!) 97.5 F (36.4 C), resp. rate 20, height 5\' 7"  (1.702 m), weight 107.2 kg, last menstrual period 11/21/2011, SpO2 100 %.       No intake or output  data in the 24 hours ending 06/11/20 0540 Filed Weights   06/09/2020 1316 06/04/2020 2320  Weight: 102.1 kg 107.2 kg   Examination: General: not toxic appearing, no acute distress, awake, verbal HENT: NCAT, mildly dry MM Lungs: Nonlabored, clear breath sounds, no wheezing, rhonchi or rales Cardiovascular: S1S2 present, RRR Abdomen: obese, soft, no significant TTP, no guarding or rebound, +BS Extremities: bilateral legs wrapped with dressing and ACE (leg wounds -- see pictures below) Neuro: A&Ox3, fluent speech,  good historian, moves all extremities GU:  Urine appears dark tea color via [facility] Problem list     Assessment & Plan:   Undifferentiated shock Most likely hypovolemic but could be mixed with septic or cardiogenic with progressive cardiomyopathy.  Obstructive shock unlikely, has no cardiac complaints and on room air w/ sats 99%.  Reduced intake past 2 weeks though no report of vomiting and diarrhea.  Infection source could be leg wound, bacteremia, but lung or urine infection unlikely given benign CXR and UA and lack of respective symptoms.  Transfer to ICU  Hold further fluids at this time  Levophed as needed to maintain MAP > 65  I/Os  Obtain ECHO to eval EF, wall motion abnorm, valvular function  Check sed rate, CRP and PCT  Continue empiric Vanc and Cefepime for now  Progressive weakness S/P Ground-level fall Likely multifactorial including significant protein malnutrition.  Check prealbumin  PT/OT  AKI on CKD stage III, likely pre-renal Cr 2.4 with normal baseline of 1.3.  Likely due to poor oral intake past 2 weeks.  Hold further IV fluids at this time  Avoid nephrotoxic agents  Monitor I/Os  Check urine electrolytes, Cr, urea and myoglobin  Obtain urine US  Renally dose medications  Chronic venous stasis and ulcers May be infected, has been chronic nonhealing, prior skin grafting.  Wound consult  Continue meticulous wound care  Optimize nutrition status  Continue empiric antibiotics  Protein malnutrition with severe hypoalbuminemia   Optimize nutrition status  Nutrition consult  Prolonged QTc Mag normal at 2.3.  Avoid QT prolonging medications  Monitor electrolytes, replete as needed  Hypokalemia  Replete K   Best practice:  Diet:  Heart healthy Pain/Anxiety/Delirium protocol (if indicated): n/a VAP protocol (if indicated): n/a DVT prophylaxis: Lovenox SQ GI prophylaxis: n/a Mobility:  Bedrest  while hypotensive Code Status: Full Family Communication: Dayteam will update Disposition: pending PT eval  Labs   CBC: Recent Labs  Lab 05/29/2020 1332  WBC 12.0*  NEUTROABS 10.6*  HGB 11.5*  HCT 35.8*  MCV 93.5  PLT 127    Basic Metabolic Panel: Recent Labs  Lab 05/29/2020 1332 06/17/2020 1625  NA 143  --   K 2.8*  --   CL 106  --   CO2 25  --   GLUCOSE 100*  --   BUN 66*  --   CREATININE 2.40*  --   CALCIUM 7.5*  --   MG  --  2.3   GFR: Estimated Creatinine Clearance: 32.2 mL/min (A) (by C-G formula based on SCr of 2.4 mg/dL (H)). Recent Labs  Lab 06/11/2020 1332 06/05/2020 1625  WBC 12.0*  --   LATICACIDVEN  --  2.0*    Liver Function Tests: Recent Labs  Lab 06/11/2020 1332  AST 20  ALT 16  ALKPHOS 81  BILITOT 1.8*  PROT 5.5*  ALBUMIN 1.4*   No results for input(s): LIPASE, AMYLASE in the last 168 hours. No results for  input(s): AMMONIA in the last 168 hours.  ABG    Component Value Date/Time   PHART 7.268 (L) 02/21/2018 0830   PCO2ART 29.8 (L) 02/21/2018 0830   PO2ART 92.6 02/21/2018 0830   HCO3 13.2 (L) 02/21/2018 0830   TCO2 28 07/01/2017 2056   ACIDBASEDEF 12.4 (H) 02/21/2018 0830   O2SAT 96.6 02/21/2018 0830     Coagulation Profile: Recent Labs  Lab 05/28/2020 1332  INR 1.3*    Cardiac Enzymes: Recent Labs  Lab 06/06/2020 1625  CKTOTAL 96    HbA1C: Hgb A1c MFr Bld  Date/Time Value Ref Range Status  08/20/2019 11:02 PM 5.8 (H) 4.8 - 5.6 % Final    Comment:    (NOTE) Pre diabetes:          5.7%-6.4% Diabetes:              >6.4% Glycemic control for   <7.0% adults with diabetes   12/17/2016 08:55 PM 5.6 4.8 - 5.6 % Final    Comment:    (NOTE)         Pre-diabetes: 5.7 - 6.4         Diabetes: >6.4         Glycemic control for adults with diabetes: <7.0     CBG: No results for input(s): GLUCAP in the last 168 hours.  Review of Systems:   All systems reviewed and negative except as noted in HPI.  Past Medical History   She,  has a past medical history of Arthritis, Asthma, CHF (congestive heart failure) ([place]) (03/12/2014), Depression, Headache(784.0), History of blood transfusion, Hyperlipidemia, Hypertension, Lung nodules, Myocardial infarction ([place]) (06/26/2014), Renal disorder, Sarcoidosis, and Ulcer.   Surgical History    Past Surgical History:  Procedure Laterality Date  . cataract surgery Left 07-2013  . I & D EXTREMITY Bilateral 12/31/2018   Procedure: DEBRIDEMENT BILATERAL LEGS, APPLY VAC X 2;  Surgeon: [name], MD;  Location: [facility];  Service: Orthopedics;  Laterality: Bilateral;  . I & D EXTREMITY Bilateral 01/02/2019   Procedure: REPEAT DEBRIDEMENT BILATERAL LEGS, APPLY VAC X 2;  Surgeon: [name], MD;  Location: [facility];  Service: Orthopedics;  Laterality: Bilateral;  . I & D EXTREMITY Bilateral 07/15/2019   Procedure: IRRIGATION AND DEBRIDEMENT VENOUS STASIS INSUFFICIENCY ULCERATIONS BILATERAL LOWER EXTREMITIES;  Surgeon: [name], MD;  Location: [facility];  Service: Orthopedics;  Laterality: Bilateral;  . I & D EXTREMITY Bilateral 08/23/2019   Procedure: DEBRIDEMENT EXTREMITY;  Surgeon: [name], MD;  Location: [place];  Service: Orthopedics;  Laterality: Bilateral;  . ICD IMPLANT N/A 07/05/2017   Procedure: ICD Implant;  Surgeon: Constance Haw, MD;  Location: [facility];  Service: Cardiovascular;  Laterality: N/A;  . LEFT HEART CATH AND CORONARY ANGIOGRAPHY N/A 07/03/2017   Procedure: Left Heart Cath and Coronary Angiography;  Surgeon: Belva Crome, MD;  Location: [facility];  Service: Cardiovascular;  Laterality: N/A;  . SKIN SPLIT GRAFT Bilateral 01/07/2019   Procedure: SPLIT THICKNESS SKIN GRAFT BILATERAL LEGS, APPLY VAC;  Surgeon: [name], MD;  Location: [facility];  Service: Orthopedics;  Laterality: Bilateral;  . SKIN SPLIT GRAFT Bilateral 07/17/2019   Procedure: REPEAT IRRIGATION AND DEBRIDEMENT BILATERAL LOWER EXTREMITIES, SKIN GRAFT;  Surgeon: [name], MD;  Location: [facility] [facility];  Service: Orthopedics;  Laterality: Bilateral;  . SKIN SPLIT GRAFT Bilateral 08/26/2019   Procedure: SKIN GRAFT SPLIT THICKNESS BILATERAL LEGS, APPLY WOUND VAC;  Surgeon: Sharol Given,  Illene Regulus, MD;  Location: [facility];  Service: Orthopedics;  Laterality: Bilateral;     Social History   reports that she has never smoked. She has never used smokeless tobacco. She reports that she does not drink alcohol and does not use drugs.   Family History   Her family history includes Cancer in her mother; Diabetes Mellitus II in her father and mother; Heart disease in her father; Heart failure in her mother; Hypertension in her mother; Stroke in her father.   Allergies Allergies  Allergen Reactions  . Tramadol Other (See Comments)    hallucination     Home Medications  Prior to Admission medications   Medication Sig Start Date End Date Taking? Authorizing Provider  acetaminophen (TYLENOL) 325 MG tablet Take 1-2 tablets (325-650 mg total) by mouth every 4 (four) hours as needed for mild pain. 07/22/19  Yes Love, Ivan Anchors, PA-C  albuterol (PROVENTIL HFA;VENTOLIN HFA) 108 (90 Base) MCG/ACT inhaler Inhale 2 puffs into the lungs every 6 (six) hours as needed for shortness of breath. 02/02/19  Yes Medina-Vargas, Monina C, NP  allopurinol (ZYLOPRIM) 100 MG tablet TAKE 2 TABLETS (200 MG TOTAL) BY MOUTH DAILY. Patient taking differently: Take 100 mg by mouth in the morning and at bedtime.  05/05/20  Yes Rutherford Guys, MD  Ascorbic Acid (VITAMIN C) 1000 MG tablet Take 1,000 mg by mouth daily.   Yes [provider]  aspirin EC 81 MG tablet Take 81 mg by mouth daily.   Yes [provider]  carvedilol (COREG) 6.25 MG tablet Take 1 tablet (6.25 mg total) by mouth 2 (two) times daily. 02/02/19  Yes Medina-Vargas, Monina C, NP  ferrous sulfate 325 (65 FE) MG EC tablet Take 325 mg by mouth 3 (three) times daily with meals.   Yes [provider]  furosemide (LASIX) 40 MG  tablet Take 40 mg by mouth daily.   Yes [provider]  HYDROcodone-acetaminophen (NORCO/VICODIN) 5-325 MG tablet Take 1 tablet by mouth every 6 (six) hours as needed for moderate pain. 05/05/20  Yes Persons, Bevely Palmer, PA  senna-docusate (SENOKOT-S) 8.6-50 MG tablet Take 2 tablets by mouth 2 (two) times daily. 09/03/19  Yes Love, Ivan Anchors, PA-C  ondansetron (ZOFRAN ODT) 4 MG disintegrating tablet Take 1 tablet (4 mg total) by mouth every 8 (eight) hours as needed for nausea or vomiting. Patient not taking: Reported on 06/07/2020 02/08/20   Long, Wonda Olds, MD  oxyCODONE-acetaminophen (PERCOCET) 5-325 MG tablet Take 1 tablet by mouth every 4 (four) hours as needed for severe pain. Patient not taking: Reported on 05/31/2020 04/14/20 04/14/21  Persons, Bevely Palmer, PA  polyethylene glycol (MIRALAX / GLYCOLAX) 17 g packet Take 17 g by mouth daily as needed. Patient not taking: Reported on 06/19/2020 09/03/19   Bary Leriche, PA-C     Critical care time:  35 minutes    ___________________ Samuella Cota. Reinaldo Berber, MD [facility]

## 2020-06-11 NOTE — Progress Notes (Signed)
Spoke with patient's nurse regarding patient's blood pressures.  He reported that her albumin finished more than 30 minutes ago and rechecked her blood pressures while we were on the phone.  Blood pressures remain in the 70s/50s with maps in the low 60s.  I contacted the critical care on-call provider and discussed this with him.  He informed me that he will send someone to evaluate her.  We greatly appreciate critical care's input and if she were to go to the ICU we would be happy to take her on our service when she is stable enough for floor status.

## 2020-06-12 LAB — URINE CULTURE

## 2020-06-12 LAB — COMPREHENSIVE METABOLIC PANEL
ALT: 16 U/L (ref 0–44)
AST: 12 U/L — ABNORMAL LOW (ref 15–41)
Albumin: 1.9 g/dL — ABNORMAL LOW (ref 3.5–5.0)
Alkaline Phosphatase: 53 U/L (ref 38–126)
Anion gap: 16 — ABNORMAL HIGH (ref 5–15)
BUN: 72 mg/dL — ABNORMAL HIGH (ref 6–20)
CO2: 16 mmol/L — ABNORMAL LOW (ref 22–32)
Calcium: 7.5 mg/dL — ABNORMAL LOW (ref 8.9–10.3)
Chloride: 111 mmol/L (ref 98–111)
Creatinine, Ser: 2.24 mg/dL — ABNORMAL HIGH (ref 0.44–1.00)
GFR calc Af Amer: 27 mL/min — ABNORMAL LOW (ref >60)
GFR calc non Af Amer: 23 mL/min — ABNORMAL LOW (ref >60)
Glucose, Bld: 91 mg/dL (ref 70–99)
Potassium: 3.1 mmol/L — ABNORMAL LOW (ref 3.5–5.1)
Sodium: 143 mmol/L (ref 135–145)
Total Bilirubin: 1.7 mg/dL — ABNORMAL HIGH (ref 0.3–1.2)
Total Protein: 5.1 g/dL — ABNORMAL LOW (ref 6.5–8.1)

## 2020-06-12 LAB — CBC
HCT: 32.6 % — ABNORMAL LOW (ref 36.0–46.0)
Hemoglobin: 10.3 g/dL — ABNORMAL LOW (ref 12.0–15.0)
MCH: 30.7 pg (ref 26.0–34.0)
MCHC: 31.6 g/dL (ref 30.0–36.0)
MCV: 97 fL (ref 80.0–100.0)
Platelets: 177 10*3/uL (ref 150–400)
RBC: 3.36 MIL/uL — ABNORMAL LOW (ref 3.87–5.11)
RDW: 17.3 % — ABNORMAL HIGH (ref 11.5–15.5)
WBC: 11.8 10*3/uL — ABNORMAL HIGH (ref 4.0–10.5)
nRBC: 0 % (ref 0.0–0.2)

## 2020-06-12 LAB — BRAIN NATRIURETIC PEPTIDE: B Natriuretic Peptide: 421.1 pg/mL — ABNORMAL HIGH (ref 0.0–100.0)

## 2020-06-12 LAB — BASIC METABOLIC PANEL
Anion gap: 14 (ref 5–15)
BUN: 75 mg/dL — ABNORMAL HIGH (ref 6–20)
CO2: 18 mmol/L — ABNORMAL LOW (ref 22–32)
Calcium: 7.7 mg/dL — ABNORMAL LOW (ref 8.9–10.3)
Chloride: 114 mmol/L — ABNORMAL HIGH (ref 98–111)
Creatinine, Ser: 2.19 mg/dL — ABNORMAL HIGH (ref 0.44–1.00)
GFR calc Af Amer: 28 mL/min — ABNORMAL LOW (ref >60)
GFR calc non Af Amer: 24 mL/min — ABNORMAL LOW (ref >60)
Glucose, Bld: 95 mg/dL (ref 70–99)
Potassium: 3.2 mmol/L — ABNORMAL LOW (ref 3.5–5.1)
Sodium: 146 mmol/L — ABNORMAL HIGH (ref 135–145)

## 2020-06-12 LAB — UREA NITROGEN, URINE: Urea Nitrogen, Ur: 705 mg/dL

## 2020-06-12 LAB — PROCALCITONIN: Procalcitonin: 17.92 ng/mL

## 2020-06-12 MED ORDER — MAGNESIUM SULFATE 2 GM/50ML IV SOLN
2.0000 g | Freq: Once | INTRAVENOUS | Status: AC
  Administered 2020-06-12: 2 g via INTRAVENOUS
  Filled 2020-06-12: qty 50

## 2020-06-12 MED ORDER — LACTATED RINGERS IV BOLUS: 500.0000 mL | Freq: Once | INTRAVENOUS | Status: DC

## 2020-06-12 MED ORDER — SODIUM CHLORIDE 0.9 % IV BOLUS
500.0000 mL | Freq: Once | INTRAVENOUS | Status: AC
  Administered 2020-06-12: 500 mL via INTRAVENOUS

## 2020-06-12 MED ORDER — POTASSIUM CHLORIDE 20 MEQ PO PACK
40.0000 meq | PACK | Freq: Two times a day (BID) | ORAL | Status: AC
  Administered 2020-06-12 – 2020-06-13 (×3): 40 meq via ORAL
  Filled 2020-06-12 (×3): qty 2

## 2020-06-12 MED ORDER — RINGERS IV SOLN: INTRAVENOUS | Status: DC

## 2020-06-12 NOTE — Evaluation (Signed)
Physical Therapy Evaluation Patient Details Name: Kari [name] MRN: [id number] DOB: [birth year] Today's Date: 06/12/2020   History of Present Illness  [name] is a 59 y.o. 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  She was down for 13-14 hours, until her sister came to check on her and found her on the floor.  The patient reports not eating or drinking well over the past 2 weeks and has been feeling progressively weaker  Clinical Impression  Pt admitted with/for progressive weakness, fall and down for extended period.  Pt needing total assist for basic bed mobility at this point, but deferred further mobility.  Pt currently limited functionally due to the problems listed. ( See problems list.)   Pt will benefit from PT to maximize function and safety in order to get ready for next venue listed below.     Follow Up Recommendations SNF;Other (comment) (but working toward [facility])    Equipment Recommendations  Other (comment) (TBA)    Recommendations for Other Services       Precautions / Restrictions Precautions Precautions: Fall      Mobility  Bed Mobility Overal bed mobility: Needs Assistance Bed Mobility: Rolling;Supine to Sit;Sit to Supine Rolling: Total assist   Supine to sit: Max assist Sit to supine: Total assist;+2 for physical assistance   General bed mobility comments: generally sore and unable to help much with transitions.  Transfers Overall transfer level: Needs assistance               General transfer comment: deferred  Ambulation/Gait                 Stairs            Wheelchair Mobility    Modified Rankin (Stroke Patients Only)       Balance Overall balance assessment: Needs assistance Sitting-balance support: Single extremity supported;Bilateral upper extremity supported;Feet supported Sitting balance-Leahy Scale: Fair Sitting balance - Comments: min guard with/without UE once up to EOB Postural control: Posterior lean                                   Pertinent Vitals/Pain Pain Assessment: Faces Faces Pain Scale: Hurts little more Pain Location: stomach Pain Descriptors / Indicators: Cramping;Discomfort;Grimacing Pain Intervention(s): Monitored during session    Home Living Family/patient expects to be discharged to:: Private residence Living Arrangements: Alone Available Help at Discharge: Available PRN/intermittently Type of Home: Apartment (hall towers) Home Access: Elevator     Home Layout: One level Home Equipment: Environmental consultant - 2 wheels;Cane - single point;Bedside commode      Prior Function Level of Independence: Independent with assistive device(s)               Hand Dominance   Dominant Hand: Right    Extremity/Trunk Assessment   Upper Extremity Assessment Upper Extremity Assessment: Generalized weakness    Lower Extremity Assessment Lower Extremity Assessment: Generalized weakness       Communication   Communication: No difficulties  Cognition Arousal/Alertness: Lethargic;Awake/alert Behavior During Therapy: Flat affect Overall Cognitive  Status: No family/caregiver present to determine baseline cognitive functioning                                        General Comments General comments (skin integrity, edema, etc.): vss    Exercises Other Exercises Other Exercises: warm up exer to LE's and basic ROM to bil U and LE's   Assessment/Plan    PT Assessment Patient needs continued PT services  PT Problem List Decreased  strength;Decreased activity tolerance;Decreased balance;Decreased mobility;Decreased knowledge of use of DME;Decreased knowledge of precautions;Pain       PT Treatment Interventions DME instruction;Gait training;Functional mobility training;Therapeutic activities;Balance training;Patient/family education    PT Goals (Current goals can be found in the Care Plan section)  Acute Rehab PT Goals Patient Stated Goal: pt did not participate, "I just want to rest" Time For Goal Achievement: 06/26/20 Potential to Achieve Goals: Good    Frequency Min 3X/week   Barriers to discharge        Co-evaluation               AM-PAC PT "6 Clicks" Mobility  Outcome Measure Help needed turning from your back to your side while in a flat bed without using bedrails?: Total Help needed moving from lying on your back to sitting on the side of a flat bed without using bedrails?: Total Help needed moving to and from a bed to a chair (including a wheelchair)?: Total Help needed standing up from a chair using your arms (e.g., wheelchair or bedside chair)?: Total Help needed to walk in hospital room?: Total Help needed climbing 3-5 steps with a railing? : Total 6 Click Score: 6    End of Session Equipment Utilized During Treatment: Oxygen Activity Tolerance: Patient limited by pain;Patient limited by fatigue Patient left: in bed;with call bell/phone within reach;with bed alarm set Nurse Communication: Mobility status PT Visit Diagnosis: Other abnormalities of gait and mobility (R26.89);Muscle weakness (generalized) (M62.81)    Time: 1552-0802 PT Time Calculation (min) (ACUTE ONLY): 25 min   Charges:   PT Evaluation $PT Eval Moderate Complexity: 1 Mod PT Treatments $Therapeutic Activity: 8-22 mins        06/12/2020  Ginger Carne., PT Acute Rehabilitation Services [phone]  (pager) [phone]  (office)  Kari [name] 06/12/2020, 2:10 PM

## 2020-06-12 NOTE — Progress Notes (Signed)
Critical care attending progress note.  Patient admitted with undifferentiated shock  Known NICM. On chronic diuretics. Denies GI losses.   Patient never needed vasopressors and appears to have responded to fluids.   On examination, no JVD or edema. Oral mucosa is dry. Wounds dressed but did not appear infected on photos.  Lactate is negative.  Remains hypokalemic (overdiuresis) and creatinine is elevated from baseline.   Plan:  Replace potassium Bolus IV fluids, gentle rehydration, hold diuretics for now.  Stop antibiotics if cultures are negative.  Continue to hold HF therapy, while progressive HF can also account for symptoms, would expect more signs of volume overload.   If BP and renal function continue to improve may be able to transfer later today or tomorrow.   Kipp Brood, MD [facility] ICU Physician [place]  Pager: [phone] Mobile: [phone] After hours: [phone].  06/12/2020, 10:54 AM

## 2020-06-13 DIAGNOSIS — I5042 Chronic combined systolic (congestive) and diastolic (congestive) heart failure: Secondary | ICD-10-CM

## 2020-06-13 DIAGNOSIS — E878 Other disorders of electrolyte and fluid balance, not elsewhere classified: Secondary | ICD-10-CM

## 2020-06-13 DIAGNOSIS — K5900 Constipation, unspecified: Secondary | ICD-10-CM

## 2020-06-13 DIAGNOSIS — E870 Hyperosmolality and hypernatremia: Secondary | ICD-10-CM

## 2020-06-13 DIAGNOSIS — E861 Hypovolemia: Secondary | ICD-10-CM

## 2020-06-13 DIAGNOSIS — N179 Acute kidney failure, unspecified: Secondary | ICD-10-CM

## 2020-06-13 LAB — COMPREHENSIVE METABOLIC PANEL
ALT: 18 U/L (ref 0–44)
AST: 8 U/L — ABNORMAL LOW (ref 15–41)
Albumin: 1.8 g/dL — ABNORMAL LOW (ref 3.5–5.0)
Alkaline Phosphatase: 60 U/L (ref 38–126)
Anion gap: 14 (ref 5–15)
BUN: 76 mg/dL — ABNORMAL HIGH (ref 6–20)
CO2: 20 mmol/L — ABNORMAL LOW (ref 22–32)
Calcium: 7.9 mg/dL — ABNORMAL LOW (ref 8.9–10.3)
Chloride: 113 mmol/L — ABNORMAL HIGH (ref 98–111)
Creatinine, Ser: 2.2 mg/dL — ABNORMAL HIGH (ref 0.44–1.00)
GFR calc Af Amer: 28 mL/min — ABNORMAL LOW (ref >60)
GFR calc non Af Amer: 24 mL/min — ABNORMAL LOW (ref >60)
Glucose, Bld: 101 mg/dL — ABNORMAL HIGH (ref 70–99)
Potassium: 3.5 mmol/L (ref 3.5–5.1)
Sodium: 147 mmol/L — ABNORMAL HIGH (ref 135–145)
Total Bilirubin: 1.2 mg/dL (ref 0.3–1.2)
Total Protein: 5.1 g/dL — ABNORMAL LOW (ref 6.5–8.1)

## 2020-06-13 LAB — CBC
HCT: 34.2 % — ABNORMAL LOW (ref 36.0–46.0)
Hemoglobin: 10.9 g/dL — ABNORMAL LOW (ref 12.0–15.0)
MCH: 29.9 pg (ref 26.0–34.0)
MCHC: 31.9 g/dL (ref 30.0–36.0)
MCV: 94 fL (ref 80.0–100.0)
Platelets: 175 10*3/uL (ref 150–400)
RBC: 3.64 MIL/uL — ABNORMAL LOW (ref 3.87–5.11)
RDW: 17.2 % — ABNORMAL HIGH (ref 11.5–15.5)
WBC: 8.3 10*3/uL (ref 4.0–10.5)
nRBC: 0 % (ref 0.0–0.2)

## 2020-06-13 LAB — PROCALCITONIN: Procalcitonin: 13.09 ng/mL

## 2020-06-13 LAB — MAGNESIUM: Magnesium: 3 mg/dL — ABNORMAL HIGH (ref 1.7–2.4)

## 2020-06-13 MED ORDER — DOCUSATE SODIUM 100 MG PO CAPS
100.0000 mg | ORAL_CAPSULE | Freq: Two times a day (BID) | ORAL | Status: DC | PRN
  Administered 2020-06-13: 100 mg via ORAL
  Filled 2020-06-13: qty 1

## 2020-06-13 MED ORDER — POLYETHYLENE GLYCOL 3350 17 G PO PACK
17.0000 g | PACK | Freq: Every day | ORAL | Status: DC
  Administered 2020-06-13: 17 g via ORAL
  Filled 2020-06-13: qty 1

## 2020-06-13 MED ORDER — BISACODYL 10 MG RE SUPP
10.0000 mg | Freq: Once | RECTAL | Status: AC
  Filled 2020-06-13: qty 1

## 2020-06-13 MED ORDER — POTASSIUM CHLORIDE 20 MEQ PO PACK
40.0000 meq | PACK | Freq: Once | ORAL | Status: AC
  Administered 2020-06-13: 40 meq via ORAL
  Filled 2020-06-13: qty 2

## 2020-06-13 MED ORDER — ONDANSETRON HCL 4 MG/2ML IJ SOLN
4.0000 mg | Freq: Four times a day (QID) | INTRAMUSCULAR | Status: DC | PRN
  Administered 2020-06-13 – 2020-06-14 (×3): 4 mg via INTRAVENOUS
  Filled 2020-06-13 (×3): qty 2

## 2020-06-13 MED ORDER — LACTATED RINGERS IV BOLUS
1000.0000 mL | Freq: Once | INTRAVENOUS | Status: AC
  Administered 2020-06-13: 1000 mL via INTRAVENOUS

## 2020-06-13 MED ORDER — SODIUM CHLORIDE 0.9 % IV SOLN
2.0000 g | INTRAVENOUS | Status: DC
  Administered 2020-06-13: 2 g via INTRAVENOUS
  Filled 2020-06-13: qty 20

## 2020-06-13 MED ORDER — BISACODYL 10 MG RE SUPP
10.0000 mg | Freq: Every day | RECTAL | Status: DC | PRN
  Administered 2020-06-13 (×2): 10 mg via RECTAL

## 2020-06-13 NOTE — Evaluation (Signed)
Occupational Therapy Evaluation Patient Details Name: Kari [name] MRN: [id number] DOB: 10/15/1961 Today's Date: 06/13/2020    History of Present Illness [name] is a 59 y.o. 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  She was down for 13-14 hours, until her sister came to check on her and found her on the floor.  The patient reports not eating or drinking well over the past 2 weeks and has been feeling progressively weaker   Clinical Impression   Pt PTA: pt reports independence with ADL and mobility. Pt currently, very lethargic, soft spoken and "scared of getting up" for fear of falling. Pt currently maxA+2 overall for bed mobility and sit to stands. Pt unable to move feet so pt pivoting up towards HOB. Pt set-upA to [place] for ADL tasks. Pt limited by decreased strength, decreased ability to care for self and decreased activity tolerance. Pt's BP chronically low per RN- 78/62 sitting EOB and 80/62 in supine. Pt would greatly benefit from continued OT skilled services for ADL, mobility and cognition. OT following acutely.      Follow Up Recommendations  SNF    Equipment Recommendations  None recommended by OT    Recommendations for Other Services       Precautions / Restrictions Precautions Precautions: Fall Restrictions Weight Bearing Restrictions: No      Mobility Bed Mobility Overal bed mobility: Needs Assistance Bed Mobility: Supine to Sit;Sit to Supine     Supine to sit: Mod assist;+2 for physical assistance;+2 for safety/equipment Sit to supine: Max assist;+2 for  physical assistance   General bed mobility comments: Assist for trunk stability and movement of BLEs. Pt using rails after verbal cues for hand placement.  Transfers Overall transfer level: Needs assistance Equipment used: 2 person hand held assist Transfers: Sit to/from [employer] Sit to Stand: Max assist;From elevated surface;+2 physical assistance   Squat pivot transfers: Max assist;+2 physical assistance;From elevated surface     General transfer comment: Pt using +2 hand held assist.    Balance Overall balance assessment: Needs assistance Sitting-balance support: Single extremity supported;Feet supported Sitting balance-Leahy Scale: Fair     Standing balance support: Bilateral upper extremity supported Standing balance-Leahy Scale: Poor Standing balance comment:  Pt able to perform sit to stand x2 times, but unable to take steps and b/l knee block helps.                           ADL either performed or assessed with clinical judgement   ADL Overall ADL's : Needs assistance/impaired Eating/Feeding: Set up;Bed level Eating/Feeding Details (indicate cue type and reason): pt in chair position with set-upA for opening containers and cutting meat. Pt fatigues easily bringing food to mouth. Grooming: Minimal assistance;Sitting Grooming Details (indicate cue type and reason): Pt set-upA in bed for grooming teeth, but unable to tolerate sitting EOB for task. Upper Body Bathing: Moderate assistance;Sitting;Bed level   Lower Body Bathing: Maximal assistance;Sitting/lateral leans;Bed level   Upper Body Dressing : Moderate assistance;Sitting;Bed level   Lower Body Dressing: Maximal assistance;Sitting/lateral leans;Bed level   Toilet Transfer: Maximal  assistance;+2 for physical assistance;+2 for safety/equipment Toilet Transfer Details (indicate cue type and reason): Pt able to squat pivot toward HOB. Pt unable to take steps. Toileting- Clothing  Manipulation and Hygiene: Maximal assistance;+2 for physical assistance;+2 for safety/equipment;Sitting/lateral lean;Bed level       Functional mobility during ADLs: Maximal assistance;+2 for physical assistance;+2 for safety/equipment General ADL Comments: Pt limited by decreased strength, decreased ability to care for self and decreased activity tolerance. Pt's BP chronically low per RN- 78/62 sitting EOB and 80/62 in supine.     Vision Baseline Vision/History: No visual deficits Patient Visual Report: No change from baseline Vision Assessment?: No apparent visual deficits     Perception     Praxis      Pertinent Vitals/Pain Pain Assessment: Faces Faces Pain Scale: Hurts little more Pain Location: stomach Pain Descriptors / Indicators: Cramping;Discomfort;Grimacing Pain Intervention(s): Monitored during session     Hand Dominance Right   Extremity/Trunk Assessment Upper Extremity Assessment Upper Extremity Assessment: Generalized weakness   Lower Extremity Assessment Lower Extremity Assessment: Generalized weakness   Cervical / Trunk Assessment Cervical / Trunk Assessment: Other exceptions Cervical / Trunk Exceptions: large body habitus   Communication Communication Communication: No difficulties   Cognition Arousal/Alertness: Lethargic;Awake/alert Behavior During Therapy: Flat affect Overall Cognitive Status: No family/caregiver present to determine baseline cognitive functioning                                 General Comments: Pt following commands. Pt does not annunciate so speech is low and hard to decipher.   General Comments  VSS.BP remained 80s/60s    Exercises     Shoulder Instructions      Home Living Family/patient expects to be discharged to:: Private residence Living Arrangements: Alone Available Help at Discharge: Available PRN/intermittently Type of Home: Apartment Home Access: Elevator     Home Layout: One level      Bathroom Shower/Tub: Occupational psychologist: Handicapped height Bathroom Accessibility: Yes   Home Equipment: Environmental consultant - 2 wheels;Cane - single point;Bedside commode          Prior Functioning/Environment Level of Independence: Independent with assistive device(s)        Comments: Pt reports independence prior        OT Problem List: Decreased strength;Decreased activity tolerance;Impaired balance (sitting and/or standing);Decreased safety awareness;Pain;Decreased knowledge of use of DME or AE;Decreased cognition;Increased edema      OT Treatment/Interventions: Self-care/ADL training;Therapeutic exercise;Energy conservation;DME and/or AE instruction;Therapeutic activities;Balance training;Patient/family education    OT Goals(Current goals can be found in the care plan section) Acute Rehab OT Goals Patient Stated Goal: "I'll give it a try." OT Goal Formulation: With patient Time For Goal Achievement: 06/20/20 Potential to Achieve Goals: Good ADL Goals Pt Will Perform Grooming: with min assist;standing Pt Will Perform Lower Body Dressing: with min guard assist;sit to/from stand;with adaptive equipment Pt Will Transfer to Toilet: with mod assist;stand pivot transfer Pt/caregiver will Perform Home Exercise Program: Increased strength;Both right and left upper extremity;With minimal assist Additional ADL Goal #1: Pt will increase to  tolerate x6 mins of ADL tasks with minA overall with <2 rest breaks throughout to increase activity tolerance.  OT Frequency: Min 2X/week   Barriers to D/C:            Co-evaluation              AM-PAC OT "6 Clicks" Daily Activity  Outcome Measure Help from another person eating meals?: A Little Help from another person taking care of personal grooming?: A Little Help from another person toileting, which includes using toliet, bedpan, or urinal?: A Lot Help from another person bathing (including washing, rinsing, drying)?: A  Lot Help from another person to put on and taking off regular upper body clothing?: A Lot Help from another person to put on and taking off regular lower body clothing?: A Lot 6 Click Score: 14   End of Session Equipment Utilized During Treatment: Gait belt Nurse Communication: Mobility status  Activity Tolerance: Patient limited by fatigue;Patient limited by lethargy Patient left: in bed;with call bell/phone within reach  OT Visit Diagnosis: Unsteadiness on feet (R26.81);Muscle weakness (generalized) (M62.81)                Time: 9476-5465 OT Time Calculation (min): 31 min Charges:  OT General Charges $OT Visit: 1 Visit OT Evaluation $OT Eval Moderate Complexity: 1 Mod OT Treatments $Self Care/Home Management : 8-22 mins  Jefferey Pica, OTR/L Acute Rehabilitation Services Pager: [phone] Office: [phone]  Detrick Dani C 06/13/2020, 4:12 PM

## 2020-06-13 NOTE — Progress Notes (Addendum)
Pharmacy Antibiotic Note  Kari [name] is a 59 y.o. female admitted on 06/08/2020 with sepsis.  Pharmacy has been consulted for cefepime and vancomycin dosing. Possible source bilateral foot ulcers.   Creatinine elevated from baseline, today at 2.2 (eCrCl ~36 ml/min). WBC count down to 8.3. Patient is afebrile, Tmax 97.7. UCx showed multiple species and blood cultures have ngtd.  Plan per CCM - if cultures remain negative d/c antibiotics tomorrow (6/22).  Plan: Continue Vancomycin 1250 IV every 24 hours.  Goal trough 15-20 mcg/mL.  Continue Cefepime 2 grams IV every 12 hours Monitor renal function, clinical status Follow up de-escalation and LOT  ADDENDUM Per MD: D/c Vanc and Cefepime Ceftriaxone for 5 more days  Height: 5\' 7"  (170.2 cm) Weight: 110.3 kg (243 lb 2.7 oz) IBW/kg (Calculated) : 61.6  Temp (24hrs), Avg:97.5 F (36.4 C), Min:97.4 F (36.3 C), Max:97.7 F (36.5 C)  Recent Labs  Lab 06/02/2020 1332 06/08/2020 1332 06/17/2020 1625 06/11/20 0724 06/11/20 0854 06/12/20 0233 06/12/20 1602 06/13/20 0321  WBC 12.0*  --   --  11.4*  --  11.8*  --  8.3  CREATININE 2.40*   < >  --  2.32* 2.33* 2.24* 2.19* 2.20*  LATICACIDVEN  --   --  2.0* 0.9  --   --   --   --    < > = values in this interval not displayed.    Estimated Creatinine Clearance: 35.7 mL/min (A) (by C-G formula based on SCr of 2.2 mg/dL (H)).    Allergies  Allergen Reactions  . Tramadol Other (See Comments)    hallucination    Antimicrobials this admission: vancomycin 6/18 >>  cefepime 6/18 >>   Dose adjustments this admission: N/A  Microbiology results: 6/18 BCx: ngtd 6/18 UCx: multiple species present   Alanda Slim, PharmD, [facility] Clinical Pharmacist Please see AMION for all Pharmacists' Contact Phone Numbers 06/13/2020, 9:09 AM

## 2020-06-13 NOTE — Progress Notes (Signed)
NAME:  DEVANEY SEGERS, MRN:  [id number], DOB:  [birth year], LOS: 3 ADMISSION DATE:  06/01/2020, CONSULTATION DATE:  06/13/20 REFERRING MD:  Gifford Shave, MD, CHIEF COMPLAINT:  Weak  Brief History   Ms. Syann NEZIAH VOGELGESANG is a 59 y.o. 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  She was down for 13-14 hours, until her sister came to check on her and found her on the floor.  The patient reports not eating or drinking well over the past 2 weeks and has been feeling progressively weaker.  She lives alone and normally walks with a cane.  She denies any fever, cough, congestion, runny nose, pain with urination or diarrhea.  She has chronic venous stasis ulcers BLE, follows with Dr. Sharol Given, and states that she feels like her wounds are improving and denies increased redness, pain or edema.   Upon arrival to the ED, code sepsis was called due to hypotension and hypothermia (96.6 F). Lactic acid 2.0, WBC 12.0, hemoglobin 11.5, potassium 2.8, creatinine 2.40, albumin 1.4.  UA showed hazy urine with protein 30, few bacteria.  Chest x-ray showed no acute disease. Initial EKG showed sinus rhythm with PVCs, repeat EKG showed right bundle branch block with prolonged QTc  of 561.  Potassium was repleted with 10 mEq x3 and IV fluids were started at 30 cc/kg, 1 L of 5% albumin and another liter of crystalloid.  History of present illness   Stable.  MAPs low 70s  Past Medical History   Past Medical History:  Diagnosis Date  . Arthritis   . Asthma   . CHF (congestive heart failure) ([place]) 03/12/2014   a. EF 40-45% by echo in  06/2017 with normal cors by cath. ICD placed following VT arrest  . Depression   . Headache(784.0)   . History of blood transfusion   . Hyperlipidemia   . Hypertension   . Lung nodules   . Myocardial infarction ([place]) 06/26/2014  . Renal disorder    followed by [facility]  . Sarcoidosis   . Ulcer    recurring, from chronic venous insufficiency   Significant Hospital Events     Consults:  PCCM  Procedures:  None  Significant Diagnostic Tests:  Echo 6/19 > EF 50-55%.  Micro Data:  SARS Coronavirus 2 - Negative Blood: pending Urine: pending  Antimicrobials:  Vancomycin Cefepime  Interim history/subjective:  States she feels somewhat improved. Complains of constipation and feels needs something for BM  Objective   Blood pressure (!) 84/63, pulse 64, temperature (!) 97.5 F (36.4 C), temperature source Axillary, resp. rate (!) 22, height 5\' 7"  (1.702 m), weight 110.3 kg, last menstrual period 11/21/2011, SpO2 99 %.        Intake/Output Summary (Last 24 hours) at 06/13/2020 0837 Last data filed at 06/13/2020 0800 Gross per 24 hour  Intake 2092.38 ml  Output 775 ml  Net 1317.38 ml   Filed Weights   05/25/2020 1316 06/08/2020 2320 06/11/20 1430  Weight: 102.1 kg 107.2 kg 110.3 kg   Examination: General: Adult female, resting in bed, in NAD. Neuro: A&O x 3, no deficits. HEENT: MM dry.  [place]/AT. Sclerae anicteric. EOMI. Cardiovascular: RRR, no M/R/G.  Lungs: Respirations even and unlabored.  CTA bilaterally, No W/R/R.  Abdomen: BS x 4, soft, NT/ND.  Musculoskeletal: No gross deformities, no edema.  Skin: Leg dressings C/D/I.  Intact, warm, no rashes.      Assessment & Plan:   Undifferentiated shock - Most likely hypovolemic.  Possibly could be mixed with septic or cardiogenic with progressive cardiomyopathy but doubtful and cultures remain negative.   - Continue fluids. - D/c abx in AM if cultures remain neg. - OK to transfer out of ICU  today.  Progressive weakness s/p Ground-level fall. - PT recommending SNF.  AKI on CKD stage III, likely pre-renal - due to poor oral intake past 2 weeks. - Continue fluids. - Avoid nephrotoxic agents  Chronic venous stasis and ulcers (followed by Dr. Sharol Given) - Continue routine wound care. - F/u with ortho as outpatient.  Protein malnutrition with severe hypoalbuminemia  - Optimize nutrition status.  Prolonged QTc. - Monitor electrolytes, replete as needed. - Repeat mag now.  Constipation. - Add dulcolax and colace PRN.  Stable for transfer to tele.  Will ask FPTS to resume care in AM with PCCM off.  Best practice:  Diet:  Heart healthy Pain/Anxiety/Delirium protocol (if indicated): n/a VAP protocol (if indicated): n/a DVT prophylaxis: Lovenox SQ GI prophylaxis: n/a Mobility:  Bedrest while hypotensive Code Status: Full Family Communication: Sister updated at bedside Disposition: transfer to tele and ask FPTS to resume care in AM 6/22.  PT recommending SNF on discharge.   Montey Hora, [place] [facility] 06/13/2020, 8:56 AM

## 2020-06-13 NOTE — Progress Notes (Signed)
Dr Carlis Abbott notified of continued low BP, lethargy and no BM. Transfer orders d/c'd, will monitor BP overnight in ICU and revisit transfer to lower level of care in am.

## 2020-06-14 ENCOUNTER — Encounter (HOSPITAL_COMMUNITY): Payer: Self-pay | Admitting: Pulmonary Disease

## 2020-06-14 ENCOUNTER — Inpatient Hospital Stay (HOSPITAL_COMMUNITY): Payer: Medicare Other

## 2020-06-14 DIAGNOSIS — R531 Weakness: Secondary | ICD-10-CM

## 2020-06-14 DIAGNOSIS — R6521 Severe sepsis with septic shock: Secondary | ICD-10-CM

## 2020-06-14 DIAGNOSIS — Z452 Encounter for adjustment and management of vascular access device: Secondary | ICD-10-CM

## 2020-06-14 DIAGNOSIS — A419 Sepsis, unspecified organism: Principal | ICD-10-CM

## 2020-06-14 DIAGNOSIS — R140 Abdominal distension (gaseous): Secondary | ICD-10-CM

## 2020-06-14 LAB — CBC
HCT: 34.8 % — ABNORMAL LOW (ref 36.0–46.0)
Hemoglobin: 11.4 g/dL — ABNORMAL LOW (ref 12.0–15.0)
MCH: 30.7 pg (ref 26.0–34.0)
MCHC: 32.8 g/dL (ref 30.0–36.0)
MCV: 93.8 fL (ref 80.0–100.0)
Platelets: 191 10*3/uL (ref 150–400)
RBC: 3.71 MIL/uL — ABNORMAL LOW (ref 3.87–5.11)
RDW: 17.7 % — ABNORMAL HIGH (ref 11.5–15.5)
WBC: 13.6 10*3/uL — ABNORMAL HIGH (ref 4.0–10.5)
nRBC: 0.2 % (ref 0.0–0.2)

## 2020-06-14 LAB — LACTIC ACID, PLASMA
Lactic Acid, Venous: 1.2 mmol/L (ref 0.5–1.9)
Lactic Acid, Venous: 1.5 mmol/L (ref 0.5–1.9)

## 2020-06-14 LAB — POCT I-STAT 7, (LYTES, BLD GAS, ICA,H+H)
Acid-base deficit: 8 mmol/L — ABNORMAL HIGH (ref 0.0–2.0)
Bicarbonate: 15.1 mmol/L — ABNORMAL LOW (ref 20.0–28.0)
Calcium, Ion: 1.1 mmol/L — ABNORMAL LOW (ref 1.15–1.40)
HCT: 35 % — ABNORMAL LOW (ref 36.0–46.0)
Hemoglobin: 11.9 g/dL — ABNORMAL LOW (ref 12.0–15.0)
O2 Saturation: 98 %
Potassium: 3.8 mmol/L (ref 3.5–5.1)
Sodium: 148 mmol/L — ABNORMAL HIGH (ref 135–145)
TCO2: 16 mmol/L — ABNORMAL LOW (ref 22–32)
pCO2 arterial: 23.9 mmHg — ABNORMAL LOW (ref 32.0–48.0)
pH, Arterial: 7.409 (ref 7.350–7.450)
pO2, Arterial: 106 mmHg (ref 83.0–108.0)

## 2020-06-14 LAB — COMPREHENSIVE METABOLIC PANEL
ALT: 20 U/L (ref 0–44)
AST: 10 U/L — ABNORMAL LOW (ref 15–41)
Albumin: 1.6 g/dL — ABNORMAL LOW (ref 3.5–5.0)
Alkaline Phosphatase: 56 U/L (ref 38–126)
Anion gap: 14 (ref 5–15)
BUN: 82 mg/dL — ABNORMAL HIGH (ref 6–20)
CO2: 16 mmol/L — ABNORMAL LOW (ref 22–32)
Calcium: 7.5 mg/dL — ABNORMAL LOW (ref 8.9–10.3)
Chloride: 118 mmol/L — ABNORMAL HIGH (ref 98–111)
Creatinine, Ser: 2.33 mg/dL — ABNORMAL HIGH (ref 0.44–1.00)
GFR calc Af Amer: 26 mL/min — ABNORMAL LOW (ref >60)
GFR calc non Af Amer: 22 mL/min — ABNORMAL LOW (ref >60)
Glucose, Bld: 87 mg/dL (ref 70–99)
Potassium: 3.7 mmol/L (ref 3.5–5.1)
Sodium: 148 mmol/L — ABNORMAL HIGH (ref 135–145)
Total Bilirubin: 1.4 mg/dL — ABNORMAL HIGH (ref 0.3–1.2)
Total Protein: 4.7 g/dL — ABNORMAL LOW (ref 6.5–8.1)

## 2020-06-14 LAB — TROPONIN I (HIGH SENSITIVITY)
Troponin I (High Sensitivity): 9 ng/L (ref <18)
Troponin I (High Sensitivity): 9 ng/L (ref <18)

## 2020-06-14 LAB — COOXEMETRY PANEL
Carboxyhemoglobin: 1.5 % (ref 0.5–1.5)
Methemoglobin: 1.3 % (ref 0.0–1.5)
O2 Saturation: 75.2 %
Total hemoglobin: 12 g/dL (ref 12.0–16.0)

## 2020-06-14 LAB — PROTIME-INR
INR: 1.7 — ABNORMAL HIGH (ref 0.8–1.2)
Prothrombin Time: 19.2 seconds — ABNORMAL HIGH (ref 11.4–15.2)

## 2020-06-14 LAB — BASIC METABOLIC PANEL
Anion gap: 15 (ref 5–15)
BUN: 84 mg/dL — ABNORMAL HIGH (ref 6–20)
CO2: 15 mmol/L — ABNORMAL LOW (ref 22–32)
Calcium: 7.6 mg/dL — ABNORMAL LOW (ref 8.9–10.3)
Chloride: 118 mmol/L — ABNORMAL HIGH (ref 98–111)
Creatinine, Ser: 2.49 mg/dL — ABNORMAL HIGH (ref 0.44–1.00)
GFR calc Af Amer: 24 mL/min — ABNORMAL LOW (ref >60)
GFR calc non Af Amer: 21 mL/min — ABNORMAL LOW (ref >60)
Glucose, Bld: 83 mg/dL (ref 70–99)
Potassium: 3.8 mmol/L (ref 3.5–5.1)
Sodium: 148 mmol/L — ABNORMAL HIGH (ref 135–145)

## 2020-06-14 LAB — MAGNESIUM: Magnesium: 2.8 mg/dL — ABNORMAL HIGH (ref 1.7–2.4)

## 2020-06-14 LAB — GLUCOSE, CAPILLARY
Glucose-Capillary: 59 mg/dL — ABNORMAL LOW (ref 70–99)
Glucose-Capillary: 115 mg/dL — ABNORMAL HIGH (ref 70–99)
Glucose-Capillary: 99 mg/dL (ref 70–99)

## 2020-06-14 LAB — LIPASE, BLOOD: Lipase: 162 U/L — ABNORMAL HIGH (ref 11–51)

## 2020-06-14 MED ORDER — LACTATED RINGERS IV BOLUS
1000.0000 mL | Freq: Once | INTRAVENOUS | Status: AC
  Administered 2020-06-14: 1000 mL via INTRAVENOUS

## 2020-06-14 MED ORDER — ASPIRIN 81 MG PO CHEW
81.0000 mg | CHEWABLE_TABLET | Freq: Every day | ORAL | Status: DC
  Administered 2020-06-16 – 2020-06-17 (×2): 81 mg
  Filled 2020-06-14 (×2): qty 1

## 2020-06-14 MED ORDER — DOCUSATE SODIUM 50 MG/5ML PO LIQD: 100.0000 mg | Freq: Two times a day (BID) | ORAL | Status: DC | PRN

## 2020-06-14 MED ORDER — LACTATED RINGERS IV SOLN: INTRAVENOUS | Status: DC

## 2020-06-14 MED ORDER — SODIUM BICARBONATE-DEXTROSE 150-5 MEQ/L-% IV SOLN
150.0000 meq | INTRAVENOUS | Status: DC
  Administered 2020-06-14 – 2020-06-16 (×5): 150 meq via INTRAVENOUS
  Filled 2020-06-14 (×7): qty 1000

## 2020-06-14 MED ORDER — HEPARIN SODIUM (PORCINE) 5000 UNIT/ML IJ SOLN
5000.0000 [IU] | Freq: Three times a day (TID) | INTRAMUSCULAR | Status: DC
  Administered 2020-06-14 – 2020-06-15 (×3): 5000 [IU] via SUBCUTANEOUS
  Filled 2020-06-14 (×3): qty 1

## 2020-06-14 MED ORDER — SODIUM CHLORIDE 0.9 % IV SOLN
2.0000 g | Freq: Two times a day (BID) | INTRAVENOUS | Status: DC
  Administered 2020-06-14 (×2): 2 g via INTRAVENOUS
  Filled 2020-06-14 (×2): qty 2

## 2020-06-14 MED ORDER — SODIUM CHLORIDE 0.9 % IV SOLN
250.0000 mL | INTRAVENOUS | Status: DC
  Administered 2020-06-14: 250 mL via INTRAVENOUS

## 2020-06-14 MED ORDER — DEXTROSE 50 % IV SOLN
INTRAVENOUS | Status: AC
  Filled 2020-06-14: qty 50

## 2020-06-14 MED ORDER — NOREPINEPHRINE 4 MG/250ML-% IV SOLN
0.0000 ug/min | INTRAVENOUS | Status: DC
  Administered 2020-06-14: 10 ug/min via INTRAVENOUS
  Filled 2020-06-14: qty 250

## 2020-06-14 MED ORDER — PHENYLEPHRINE HCL-NACL 10-0.9 MG/250ML-% IV SOLN
25.0000 ug/min | INTRAVENOUS | Status: DC
  Administered 2020-06-14: 145 ug/min via INTRAVENOUS
  Administered 2020-06-14: 25 ug/min via INTRAVENOUS
  Administered 2020-06-14: 50 ug/min via INTRAVENOUS
  Administered 2020-06-14: 75 ug/min via INTRAVENOUS
  Filled 2020-06-14 (×4): qty 250

## 2020-06-14 MED ORDER — METRONIDAZOLE IN NACL 5-0.79 MG/ML-% IV SOLN
500.0000 mg | Freq: Three times a day (TID) | INTRAVENOUS | Status: DC
  Administered 2020-06-14 – 2020-06-15 (×3): 500 mg via INTRAVENOUS
  Filled 2020-06-14 (×4): qty 100

## 2020-06-14 MED ORDER — IOHEXOL 9 MG/ML PO SOLN
500.0000 mL | ORAL | Status: AC
  Administered 2020-06-14: 500 mL via ORAL

## 2020-06-14 MED ORDER — DEXTROSE 50 % IV SOLN
25.0000 mL | Freq: Once | INTRAVENOUS | Status: AC
  Administered 2020-06-14: 25 mL via INTRAVENOUS

## 2020-06-14 MED ORDER — POLYETHYLENE GLYCOL 3350 17 G PO PACK: 17.0000 g | PACK | Freq: Every day | ORAL | Status: DC

## 2020-06-14 MED ORDER — ALLOPURINOL 100 MG PO TABS
50.0000 mg | ORAL_TABLET | ORAL | Status: DC
  Administered 2020-06-16: 50 mg
  Filled 2020-06-14: qty 1

## 2020-06-14 MED ORDER — PHENYLEPHRINE HCL-NACL 10-0.9 MG/250ML-% IV SOLN
0.0000 ug/min | INTRAVENOUS | Status: DC
  Administered 2020-06-14 (×3): 155 ug/min via INTRAVENOUS
  Filled 2020-06-14 (×3): qty 250

## 2020-06-14 MED ORDER — PHENYLEPHRINE CONCENTRATED 100MG/250ML (0.4 MG/ML) INFUSION SIMPLE
0.0000 ug/min | INTRAVENOUS | Status: DC
  Administered 2020-06-14: 155 ug/min via INTRAVENOUS
  Administered 2020-06-15: 160 ug/min via INTRAVENOUS
  Administered 2020-06-15: 150 ug/min via INTRAVENOUS
  Administered 2020-06-15: 200 ug/min via INTRAVENOUS
  Administered 2020-06-16: 290 ug/min via INTRAVENOUS
  Administered 2020-06-16: 260 ug/min via INTRAVENOUS
  Administered 2020-06-17: 250 ug/min via INTRAVENOUS
  Administered 2020-06-17: 175 ug/min via INTRAVENOUS
  Administered 2020-06-17: 400 ug/min via INTRAVENOUS
  Administered 2020-06-17: 325 ug/min via INTRAVENOUS
  Administered 2020-06-18: 300 ug/min via INTRAVENOUS
  Administered 2020-06-18: 350 ug/min via INTRAVENOUS
  Filled 2020-06-14 (×16): qty 250

## 2020-06-14 MED ORDER — NOREPINEPHRINE 16 MG/250ML-% IV SOLN
0.0000 ug/min | INTRAVENOUS | Status: DC
  Administered 2020-06-14: 10 ug/min via INTRAVENOUS
  Administered 2020-06-15: 24 ug/min via INTRAVENOUS
  Administered 2020-06-15: 21.973 ug/min via INTRAVENOUS
  Administered 2020-06-16: 29 ug/min via INTRAVENOUS
  Administered 2020-06-16: 30 ug/min via INTRAVENOUS
  Administered 2020-06-17: 20 ug/min via INTRAVENOUS
  Administered 2020-06-17 (×2): 50 ug/min via INTRAVENOUS
  Administered 2020-06-18: 34 ug/min via INTRAVENOUS
  Administered 2020-06-18: 35 ug/min via INTRAVENOUS
  Filled 2020-06-14 (×11): qty 250

## 2020-06-14 NOTE — Procedures (Signed)
Central Venous Catheter Insertion Procedure Note Kari [name] [id number] 07-02-1961  Procedure: Insertion of Central Venous Catheter Indications: Assessment of intravascular volume, Drug and/or fluid administration and Frequent blood sampling  Procedure Details Consent: Risks of procedure as well as the alternatives and risks of each were explained to the (patient/caregiver).  Consent for procedure obtained. Time Out: Verified patient identification, verified procedure, site/side was marked, verified correct patient position, special equipment/implants available, medications/allergies/relevent history reviewed, required imaging and test results available.  Performed  Maximum sterile technique was used including antiseptics, cap, gloves, gown, hand hygiene, mask and sheet. Skin prep: Chlorhexidine; local anesthetic administered A antimicrobial bonded/coated triple lumen catheter was placed in the right internal jugular vein using the Seldinger technique.  Evaluation Blood flow good Complications: No apparent complications Patient did tolerate procedure well. Chest X-ray ordered to verify placement.  CXR: pending.  Procedure performed under direct ultrasound guidance for real time vessel cannulation.      Montey Hora, [place] [facility] 06/14/2020, 10:19 AM

## 2020-06-14 NOTE — Progress Notes (Signed)
eLink Physician-Brief Progress Note Patient Name: SYERRA ABDELRAHMAN DOB: [birth year] MRN: [id number]   Date of Service  06/14/2020  HPI/Events of Note  Last blood glucose = 94. Nursing request for Q 4 hour CBG.  eICU Interventions  Will order: 1. Q 4 hour CBG's.     Intervention Category Major Interventions: Other:  Lysle Dingwall 06/14/2020, 8:44 PM

## 2020-06-14 NOTE — Progress Notes (Signed)
PT Cancellation Note  Patient Details Name: LORIJEAN HUSSER MRN: [id number] DOB: [birth year]   Cancelled Treatment:    Reason Eval/Treat Not Completed: Patient not medically ready;Medical issues which prohibited therapy.  Status decline in septic shock. 06/14/2020  Ginger Carne., PT Acute Rehabilitation Services [phone]  (pager) [phone]  (office)   Tessie Fass Rexann Lueras 06/14/2020, 2:39 PM

## 2020-06-14 NOTE — Progress Notes (Addendum)
Contacted to reassess patient with ongoing shock physiology and concern of worsening abdominal pain/ distention.  Evaluated by my partner about 3 hours ago for the same see consult note; CT 5 hours ago reviewed notable for SBO and large pelvic cystic mass. NG output dark bilious and about 600 since placement earlier today, but not much in last hour. Repeat lactic acid this evening 1.5.   Sister at bedside reports patient has been somnolent and minimally responsive today. She answers y/n questions but does seem disoriented; with that in mind denies abdominal pain before being examined.  Abdomen is obese, soft, distended which is more noticeable in the upper abdomen. Focally tender at umbilicus where hernia remains reducible, mildly diffusely tender elsewhere.  Repeat plain films pending. At this point there does not seem to be significant change in her clinical status compared to what our team documented earlier today. Continue NG to suction, empiric broad spectrum abx, volume resuscitation, consider repeating labs and pan-cultures. Will await results of XR.

## 2020-06-14 NOTE — Progress Notes (Signed)
Gave pt 56ml of oral contrast down NG per order.  Soon after pt began vomiting (211ml).  Zofran given, NG connected to LIS, 350 out immediately. Spoke w/ Dr. Carlis Abbott who instructed RN not to give anymore contrast, continue OG to LIS, will do CT of abd w/out contrast.  Spoke w/ CT to update.

## 2020-06-14 NOTE — Progress Notes (Signed)
Repeat LA 1.5, increasing AGMA. Trop 9, EKG without ischemic changes, ongoing ectopy. Remains on 2 vasopressors. Finishing first 1L bolus, has second bolus ordered. Abx broadened.   Surgery reduced hernia earlier. Discussed with Dr. Marlou Starks.   On physical exam she remains encephalopathic. She is awake but minimally verbal. Increased abdominal distention in upper abdomen with TTP palpation and percussion with guarding. NGT with minimal output despite flushing. Leg wounds from skin grafts examined- healing with granulation tissue and no surrounding erythema. Breathing comfortably on RA, no tachypnea.   Bicarb infusion ordered after 2nd bolus. Con't to monitor I/Os. Pressors to maintain MAP >65. KUB. D/w on call surgeon.  This patient is critically ill with multiple organ system failure which requires frequent high complexity decision making, assessment, support, evaluation, and titration of therapies. This was completed through the application of advanced monitoring technologies and extensive interpretation of multiple databases. During this encounter critical care time was devoted to patient care services described in this note for 45 minutes.  Julian Hy, DO 06/14/20 6:10 PM [facility]

## 2020-06-14 NOTE — Progress Notes (Signed)
eLink Physician-Brief Progress Note Patient Name: Kari [name] DOB: 10/21/1961 MRN: [id number]   Date of Service  06/14/2020  HPI/Events of Note  Hypotension - BP = 78/64.  eICU Interventions  Plan: 1. Phenylephrine IV infusion via PIV. Titrate to MAP >= 65.     Intervention Category Major Interventions: Hypotension - evaluation and management  Montoya Watkin Cornelia Copa 06/14/2020, 12:58 AM

## 2020-06-14 NOTE — Progress Notes (Addendum)
3 phlebotomists have now attempted to draw labs from patient w/ no success.  Pts temp continues to be low, bair hugger applied.  Pt continues to need increase in neo gtt for hypotension w/ minimal improvement. Current rate is 115, max for PIV is 200, pt does not have CVC.  Pt continues to have very frequent PVCs and non-sustained runs of vtach.  Pt still w/ little to no urine output.  All relayed to CCM PA who is at bedside.

## 2020-06-14 NOTE — Progress Notes (Signed)
Requested baseline ABG before initiation of HCO3 infusion, to confirm TCO2, from CCM, via Lilian Kapur, Therapist, sports.

## 2020-06-14 NOTE — Progress Notes (Signed)
eLink Physician-Brief Progress Note Patient Name: Kari [name] DOB: 09-14-1961 MRN: [id number]   Date of Service  06/14/2020  HPI/Events of Note  Nursing request for ABG prior to starting NaHCO3 IV infusion.   eICU Interventions  Plan: 1. ABG STAT.     Intervention Category Major Interventions: Acid-Base disturbance - evaluation and management  Eulalia Ellerman Eugene 06/14/2020, 8:15 PM

## 2020-06-14 NOTE — Consult Note (Addendum)
Kari [name] May 26, 1961  [id number].    Requesting MD: Dr. Noemi Chapel Chief Complaint/Reason for Consult: septic shock, SBO  HPI:  This is a 59 yo black female with a history of CKD, CHF with ICD, sarcoidosis, chronic venous stasis ulcers to BLE, HTN, previous MI, who is unable to provide her history secondary to encephalopathy.  According to the chart, she fell at home and was down for around 13 hours.  Her sister found her and brought her to the ED.  She was lethargic and fatigues.  She lives alone and ambulates with a cane.  She apparently was not eating or drinking well for the last 2 weeks.  No fevers, cough, congestion, or any other complaints apparently.  She does have chronic venous stasis changes to her BLEs.  She had skin grafts by Dr. Sharol Given in October.  She still has her staples in her legs from that.    She was admitted by CCM for resuscitation and placed on abx therapy.  She has been hypotensive and hypothermic.  She has had a thorough work up with negative blood cultures, urine cultures, CXR, etc.  She was starting to improve yesterday but worsened again today.  She still remains very hypotensive on 2 pressors, hypothermic, and complained of some abdominal pain today.  She underwent NGT placement with not much output.  She then had a CT scan that was completed and revealed a chronic large cyst, likely arising from the left ovary as well as some dilated small bowel with a possible transition zone in the RLQ.  She also had a fat containing umbilical hernia.  Her lactate is normal.  Her WBC is mildly elevated at 13K.  She is not making any urine with elevated creatinine.  We have been asked to see her to further evaluate her abdomen to see if we feel this may be an etiology to her illness.   ROS: ROS: Unable to obtain due to encephalopathy.  Family History  Problem Relation Age of Onset  . Heart failure Mother   . Diabetes Mellitus II Mother   . Hypertension Mother   .  Cancer Mother        unknown type  . Heart disease Father   . Stroke Father   . Diabetes Mellitus II Father     Past Medical History:  Diagnosis Date  . Arthritis   . Asthma   . CHF (congestive heart failure) ([place]) 03/12/2014   a. EF 40-45% by echo in 06/2017 with normal cors by cath. ICD placed following VT arrest  . Depression   . Headache(784.0)   . History of blood transfusion   . Hyperlipidemia   . Hypertension   . Lung nodules   . Myocardial infarction ([place]) 06/26/2014  . Renal disorder    followed by [facility]  . Sarcoidosis   . Ulcer    recurring, from chronic venous insufficiency    Past Surgical History:  Procedure Laterality Date  . cataract surgery Left 07-2013  . I & D EXTREMITY Bilateral 12/31/2018   Procedure: DEBRIDEMENT BILATERAL LEGS, APPLY VAC X 2;  Surgeon: [name], MD;  Location: [facility];  Service: Orthopedics;  Laterality: Bilateral;  . I & D EXTREMITY Bilateral 01/02/2019   Procedure: REPEAT DEBRIDEMENT BILATERAL LEGS, APPLY VAC X 2;  Surgeon: [name], MD;  Location: [facility];  Service: Orthopedics;  Laterality: Bilateral;  . I & D EXTREMITY Bilateral 07/15/2019   Procedure: IRRIGATION  AND DEBRIDEMENT VENOUS STASIS INSUFFICIENCY ULCERATIONS BILATERAL LOWER EXTREMITIES;  Surgeon: [name], MD;  Location: [facility];  Service: Orthopedics;  Laterality: Bilateral;  . I & D EXTREMITY Bilateral 08/23/2019   Procedure: DEBRIDEMENT EXTREMITY;  Surgeon: [name], MD;  Location: [facility];  Service: Orthopedics;  Laterality: Bilateral;  . ICD IMPLANT N/A 07/05/2017   Procedure: ICD Implant;  Surgeon: Constance Haw, MD;  Location: [facility];  Service: Cardiovascular;  Laterality: N/A;  . LEFT HEART CATH AND CORONARY ANGIOGRAPHY N/A 07/03/2017   Procedure: Left Heart Cath and Coronary Angiography;  Surgeon: Belva Crome, MD;  Location: [facility];  Service: Cardiovascular;  Laterality: N/A;  . SKIN SPLIT GRAFT Bilateral  01/07/2019   Procedure: SPLIT THICKNESS SKIN GRAFT BILATERAL LEGS, APPLY VAC;  Surgeon: [name], MD;  Location: [facility];  Service: Orthopedics;  Laterality: Bilateral;  . SKIN SPLIT GRAFT Bilateral 07/17/2019   Procedure: REPEAT IRRIGATION AND DEBRIDEMENT BILATERAL LOWER EXTREMITIES, SKIN GRAFT;  Surgeon: [name], MD;  Location: [facility];  Service: Orthopedics;  Laterality: Bilateral;  . SKIN SPLIT GRAFT Bilateral 08/26/2019   Procedure: SKIN GRAFT SPLIT THICKNESS BILATERAL LEGS, APPLY WOUND VAC;  Surgeon: [name], MD;  Location: [facility];  Service: Orthopedics;  Laterality: Bilateral;    Social History:  reports that she has never smoked. She has never used smokeless tobacco. She reports that she does not drink alcohol and does not use drugs.  Allergies:  Allergies  Allergen Reactions  . Tramadol Other (See Comments)    hallucination    Medications Prior to Admission  Medication Sig Dispense Refill  . acetaminophen (TYLENOL) 325 MG tablet Take 1-2 tablets (325-650 mg total) by mouth every 4 (four) hours as needed for mild pain.    Marland Kitchen albuterol (PROVENTIL HFA;VENTOLIN HFA) 108 (90 Base) MCG/ACT inhaler Inhale 2 puffs into the lungs every 6 (six) hours as needed for shortness of breath. 18 g 0  . allopurinol (ZYLOPRIM) 100 MG tablet TAKE 2 TABLETS (200 MG TOTAL) BY MOUTH DAILY. (Patient taking differently: Take 100 mg by mouth in the morning and at bedtime. ) 60 tablet 0  . Ascorbic Acid (VITAMIN C) 1000 MG tablet Take 1,000 mg by mouth daily.    Marland Kitchen aspirin EC 81 MG tablet Take 81 mg by mouth daily.    . carvedilol (COREG) 6.25 MG tablet Take 1 tablet (6.25 mg total) by mouth 2 (two) times daily. 60 tablet 0  . ferrous sulfate 325 (65 FE) MG EC tablet Take 325 mg by mouth 3 (three) times daily with meals.    . furosemide (LASIX) 40 MG tablet Take 40 mg by mouth daily.    Marland Kitchen HYDROcodone-acetaminophen (NORCO/VICODIN) 5-325 MG tablet Take 1 tablet by mouth every 6 (six) hours as needed  for moderate pain. 30 tablet 0  . senna-docusate (SENOKOT-S) 8.6-50 MG tablet Take 2 tablets by mouth 2 (two) times daily. 120 tablet 0  . ondansetron (ZOFRAN ODT) 4 MG disintegrating tablet Take 1 tablet (4 mg total) by mouth every 8 (eight) hours as needed for nausea or vomiting. (Patient not taking: Reported on 06/04/2020) 20 tablet 0  . oxyCODONE-acetaminophen (PERCOCET) 5-325 MG tablet Take 1 tablet by mouth every 4 (four) hours as needed for severe pain. (Patient not taking: Reported on 05/30/2020) 20 tablet 0  . polyethylene glycol (MIRALAX / GLYCOLAX) 17 g packet Take 17 g by mouth daily as needed. (Patient not taking: Reported on  06/20/2020) 30 each 0     Physical Exam: Blood pressure (!) 77/61, pulse 64, temperature (!) 95.4 F (35.2 C), temperature source Core, resp. rate 20, height '5\' 7"'  (1.702 m), weight 110.3 kg, last menstrual period 11/21/2011, SpO2 100 %. General: lethargic, obese, black female who is laying in bed in NAD HEENT: head is normocephalic, atraumatic.  Sclera are noninjected.  PERRL.  Ears and nose without any masses or lesions.  Nose with NGT in place with about 300 cc of dark output, (RN feels most is from her oral contrast from her CT that came right back out, apparently.)  Mouth is pink and dry Heart: regular, rate, and rhythm.  Normal s1,s2. No obvious murmurs, gallops, or rubs noted.  Palpable radial and pedal pulses bilaterally Lungs: CTAB, no wheezes, rhonchi, or rales noted.  Respiratory effort nonlabored Abd: soft, very tender over her incarcerated umbilical hernia that was able to be reduced.  This had a very small 1cm defect.  This was likely just omentum based on the CT scan, otherwise not really tender anywhere else.  No rebounding, guarding, or peritoneal signs.  Mild distention, hypoactive BS, no masses, or organomegaly noted, but fullness in her lower abdomen c/w cyst seen on CT scan. MS: all 4 extremities are symmetrical with no cyanosis, clubbing, or  edema. Skin: warm and dry with no masses, lesions, or rashes except BLE ulcers that are currently covered.  Pictures in the chart noted Neuro: Cranial nerves 2-12 grossly intact, sensation is normal throughout Psych: lethargic and mostly stares off at the ceiling.  Decrease mentation    Results for orders placed or performed during the hospital encounter of 06/07/2020 (from the past 48 hour(s))  Basic metabolic panel     Status: Abnormal   Collection Time: 06/12/20  4:02 PM  Result Value Ref Range   Sodium 146 (H) 135 - 145 mmol/L   Potassium 3.2 (L) 3.5 - 5.1 mmol/L   Chloride 114 (H) 98 - 111 mmol/L   CO2 18 (L) 22 - 32 mmol/L   Glucose, Bld 95 70 - 99 mg/dL    Comment: Glucose reference range applies only to samples taken after fasting for at least 8 hours.   BUN 75 (H) 6 - 20 mg/dL   Creatinine, Ser 2.19 (H) 0.44 - 1.00 mg/dL   Calcium 7.7 (L) 8.9 - 10.3 mg/dL   GFR calc non Af Amer 24 (L) >60 mL/min   GFR calc Af Amer 28 (L) >60 mL/min   Anion gap 14 5 - 15    Comment: Performed at [facility] [address], [place], [place] [address]  Brain natriuretic peptide     Status: Abnormal   Collection Time: 06/12/20  7:57 PM  Result Value Ref Range   B Natriuretic Peptide 421.1 (H) 0.0 - 100.0 pg/mL    Comment: Performed at [facility] [address], [place], [place] [address]  CBC     Status: Abnormal   Collection Time: 06/13/20  3:21 AM  Result Value Ref Range   WBC 8.3 4.0 - 10.5 K/uL   RBC 3.64 (L) 3.87 - 5.11 MIL/uL   Hemoglobin 10.9 (L) 12.0 - 15.0 g/dL   HCT 34.2 (L) 36 - 46 %   MCV 94.0 80.0 - 100.0 fL   MCH 29.9 26.0 - 34.0 pg   MCHC 31.9 30.0 - 36.0 g/dL   RDW 17.2 (H) 11.5 - 15.5 %   Platelets 175 150 - 400  K/uL   nRBC 0.0 0.0 - 0.2 %    Comment: Performed at [facility], [place] [address] [place], [place] [address]  Comprehensive metabolic panel     Status: Abnormal   Collection Time: 06/13/20  3:21 AM  Result Value Ref Range   Sodium 147  (H) 135 - 145 mmol/L   Potassium 3.5 3.5 - 5.1 mmol/L   Chloride 113 (H) 98 - 111 mmol/L   CO2 20 (L) 22 - 32 mmol/L   Glucose, Bld 101 (H) 70 - 99 mg/dL    Comment: Glucose reference range applies only to samples taken after fasting for at least 8 hours.   BUN 76 (H) 6 - 20 mg/dL   Creatinine, Ser 2.20 (H) 0.44 - 1.00 mg/dL   Calcium 7.9 (L) 8.9 - 10.3 mg/dL   Total Protein 5.1 (L) 6.5 - 8.1 g/dL   Albumin 1.8 (L) 3.5 - 5.0 g/dL   AST 8 (L) 15 - 41 U/L   ALT 18 0 - 44 U/L   Alkaline Phosphatase 60 38 - 126 U/L   Total Bilirubin 1.2 0.3 - 1.2 mg/dL   GFR calc non Af Amer 24 (L) >60 mL/min   GFR calc Af Amer 28 (L) >60 mL/min   Anion gap 14 5 - 15    Comment: Performed at [facility] [address], [place], [place] [address]  Procalcitonin     Status: None   Collection Time: 06/13/20  3:21 AM  Result Value Ref Range   Procalcitonin 13.09 ng/mL    Comment:        Interpretation: PCT >= 10 ng/mL: Important systemic inflammatory response, almost exclusively due to severe bacterial sepsis or septic shock. (NOTE)       Sepsis PCT Algorithm           Lower Respiratory Tract                                      Infection PCT Algorithm    ----------------------------     ----------------------------         PCT < 0.25 ng/mL                PCT < 0.10 ng/mL          Strongly encourage             Strongly discourage   discontinuation of antibiotics    initiation of antibiotics    ----------------------------     -----------------------------       PCT 0.25 - 0.50 ng/mL            PCT 0.10 - 0.25 ng/mL               OR       >80% decrease in PCT            Discourage initiation of                                            antibiotics      Encourage discontinuation           of antibiotics    ----------------------------     -----------------------------         PCT >= 0.50 ng/mL  PCT 0.26 - 0.50 ng/mL                AND       <80% decrease in PCT              Encourage initiation of                                             antibiotics       Encourage continuation           of antibiotics    ----------------------------     -----------------------------        PCT >= 0.50 ng/mL                  PCT > 0.50 ng/mL               AND         increase in PCT                  Strongly encourage                                      initiation of antibiotics    Strongly encourage escalation           of antibiotics                                     -----------------------------                                           PCT <= 0.25 ng/mL                                                 OR                                        > 80% decrease in PCT                                      Discontinue / Do not initiate                                             antibiotics  Performed at [facility], [address] [place], [place] [address]   Magnesium     Status: Abnormal   Collection Time: 06/13/20  3:21 AM  Result Value Ref Range   Magnesium 3.0 (H) 1.7 - 2.4 mg/dL    Comment: Performed at [facility] [address] [place], [place] [address]  CBC     Status: Abnormal   Collection Time: 06/14/20 10:49 AM  Result Value Ref  Range   WBC 13.6 (H) 4.0 - 10.5 K/uL   RBC 3.71 (L) 3.87 - 5.11 MIL/uL   Hemoglobin 11.4 (L) 12.0 - 15.0 g/dL   HCT 34.8 (L) 36 - 46 %   MCV 93.8 80.0 - 100.0 fL   MCH 30.7 26.0 - 34.0 pg   MCHC 32.8 30.0 - 36.0 g/dL   RDW 17.7 (H) 11.5 - 15.5 %   Platelets 191 150 - 400 K/uL   nRBC 0.2 0.0 - 0.2 %    Comment: Performed at [facility] [address], [place], [place] [address]  Comprehensive metabolic panel     Status: Abnormal   Collection Time: 06/14/20 10:49 AM  Result Value Ref Range   Sodium 148 (H) 135 - 145 mmol/L   Potassium 3.7 3.5 - 5.1 mmol/L   Chloride 118 (H) 98 - 111 mmol/L   CO2 16 (L) 22 - 32 mmol/L   Glucose, Bld 87 70 - 99 mg/dL    Comment: Glucose reference range  applies only to samples taken after fasting for at least 8 hours.   BUN 82 (H) 6 - 20 mg/dL   Creatinine, Ser 2.33 (H) 0.44 - 1.00 mg/dL   Calcium 7.5 (L) 8.9 - 10.3 mg/dL   Total Protein 4.7 (L) 6.5 - 8.1 g/dL   Albumin 1.6 (L) 3.5 - 5.0 g/dL   AST 10 (L) 15 - 41 U/L   ALT 20 0 - 44 U/L   Alkaline Phosphatase 56 38 - 126 U/L   Total Bilirubin 1.4 (H) 0.3 - 1.2 mg/dL   GFR calc non Af Amer 22 (L) >60 mL/min   GFR calc Af Amer 26 (L) >60 mL/min   Anion gap 14 5 - 15    Comment: Performed at [facility] [address] [place], [place] [address]  Magnesium     Status: Abnormal   Collection Time: 06/14/20 10:49 AM  Result Value Ref Range   Magnesium 2.8 (H) 1.7 - 2.4 mg/dL    Comment: Performed at [facility] [address] [place], [place] [address]  Lactic acid, plasma     Status: None   Collection Time: 06/14/20 10:49 AM  Result Value Ref Range   Lactic Acid, Venous 1.2 0.5 - 1.9 mmol/L    Comment: Performed at [facility] [address], [place], [place] [address]  .Cooxemetry Panel (carboxy, met, total hgb, O2 sat)     Status: None   Collection Time: 06/14/20 10:49 AM  Result Value Ref Range   Total hemoglobin 12.0 12.0 - 16.0 g/dL   O2 Saturation 75.2 %   Carboxyhemoglobin 1.5 0.5 - 1.5 %   Methemoglobin 1.3 0.0 - 1.5 %    Comment: Performed at [facility], [place] [address] [place], [place] [address]   CT ABDOMEN PELVIS WO CONTRAST  Result Date: 06/14/2020 CLINICAL DATA:  Abdominal pain and distension EXAM: CT ABDOMEN AND PELVIS WITHOUT CONTRAST TECHNIQUE: Multidetector CT imaging of the abdomen and pelvis was performed following the standard protocol without IV contrast. COMPARISON:  02/08/2020 FINDINGS: Lower chest: Minimal basilar scarring is noted. Gastric catheter is noted coiled in the stomach. Hepatobiliary: Liver demonstrates diffuse geographic decreased attenuation consistent with fatty infiltration. Gallbladder is well distended with  hyperdense material as well as a few small gallstones. No wall thickening or pericholecystic fluid is noted. Pancreas: Unremarkable. No pancreatic ductal dilatation or surrounding inflammatory changes. Spleen: Normal in size without focal  abnormality. Adrenals/Urinary Tract: Adrenal glands are within normal limits. Kidneys demonstrate no renal calculi or urinary tract obstructive changes. The bladder is partially distended. Stomach/Bowel: Colon is decompressed. The appendix is not well visualized. No inflammatory changes to suggest appendicitis are noted. Significant small bowel dilatation is noted throughout almost the entire small bowel with a transition zone in the right mid abdomen. No definitive mass lesion is seen to correspond with the transition zone although it appears somewhat similar to that seen on prior exam. Gastric catheter extends into the stomach. Vascular/Lymphatic: No significant vascular findings are present. No enlarged abdominal or pelvic lymph nodes. Reproductive: Uterus is within normal limits. There is again noted a large cystic mass lesion arising likely from the left ovary measuring approximately 21.7 cm in greatest transverse dimension. This is roughly similar to that seen on the prior exam. Other: Mild free fluid is noted slightly increased from the prior exam. Mild changes of anasarca are noted. Fat containing umbilical hernia is again seen and stable. Musculoskeletal: Degenerative changes of lumbar spine are noted. IMPRESSION: Increase in the degree of small-bowel obstruction with a transition zone in the right mid abdomen although no definitive mass lesion is seen. Stable large pelvic cystic mass likely ovarian in etiology. Cholelithiasis. Fatty infiltration of the liver. Electronically Signed   By: Inez Catalina M.D.   On: 06/14/2020 13:22   DG Abd 1 View  Result Date: 06/14/2020 CLINICAL DATA:  Abdominal distention.  Vomiting. EXAM: ABDOMEN - 1 VIEW COMPARISON:  CT 02/08/2020.  FINDINGS: Several loops of slightly prominent small bowel noted. No colonic distention. Stool in the colon. No free air visualized. No pathologic intra-abdominal calcifications identified. Increased density noted in the pelvis possibly related to previously identified large cystic pelvic mass. No acute bony abnormality. IMPRESSION: 1. Several loops of slightly prominent small bowel noted. Follow-up exam to demonstrate resolution suggested. 2. Increased density noted the pelvis possibly related to previously identified large cystic pelvic mass. Electronically Signed   By: Marcello Moores  Register   On: 06/14/2020 05:22   DG CHEST PORT 1 VIEW  Result Date: 06/14/2020 CLINICAL DATA:  New nasogastric tube and central line EXAM: PORTABLE CHEST 1 VIEW COMPARISON:  06/06/2020 FINDINGS: Nasogastric tube is looped in the stomach, with the tip at the gastric body to antrum. The upper chest is minimally excluded. Pacer/AICD device. Right internal jugular line tip at mid right atrium. Normal heart size for level of inspiration. No pleural effusion or pneumothorax. No congestive failure. No lobar consolidation. Low lung volumes with resultant pulmonary interstitial prominence. IMPRESSION: Nasogastric tube appropriately positioned. Right-sided internal jugular line tip at mid right atrium. No pneumothorax or other acute complication. Low lung volumes, without acute disease. Electronically Signed   By: Abigail Miyamoto M.D.   On: 06/14/2020 11:07      Assessment/Plan Sepsis, unclear etiology Hypotension - question some component of dehydration given down time and reported couple weeks of minima oral intake CHF ICD Chronic venous stasis wounds HLD HTN Acute on Chronic kidney disease with anuria today  Abdominal pain/umbilical hernia/SBO/ileus The patient has an incarcerated umbilical hernia containing fat per her CT scan.  This was very tender on her exam.  After much manipulation, this was able to be reduced through a very  small defect.  It is possible that her omentum had some inflammatory changes or even early ischemia causing some of her pain.  However, this would not cause her septic picture.  The rest of her abdominal exam was fairly benign.  Her CT scan does questions a bowel obstruction.  She had a small BM yesterday, mostly a smear per nursing.  Her bowel is dilated on her CT scan, but she has never had prior abdominal surgery.  It is possible she has a bowel obstruction, but it is also possible that she has an ileus secondary to her sepsis and low perfusion state.  Her lactate is normal with a minimal elevation of her WBC.  I do not think that her ileus vs SBO is the etiology of her sepsis either.  She does have this cyst in her abdomen as well, but it appears stable from her scans in 2021 and 2019.  Her small bowel was a bit dilated on those scans as well.  She may have some component of chronic dilatation from this cyst causing partial blockage.  We can do the SBO protocol, but given she threw up her oral contrast today, we will allow her NGT to continue to decompress her today and plan to do the protocol tomorrow.  Overall still an unclear picture of source of sepsis, but no acute abdomen or findings concerning for ischemia that would require acute surgical intervention.  We will follow with you.  FEN - NPO/NGT VTE - heparin ID - cefepime   Henreitta Cea, [facility] 06/14/2020, 2:26 PM Please see Am[facility] for pager number during day hours 7:00am-4:30pm or 7:00am -11:30am on weekends

## 2020-06-14 NOTE — Progress Notes (Signed)
eLink Physician-Brief Progress Note Patient Name: Kari [name] DOB: 1961-07-09 MRN: [id number]   Date of Service  06/14/2020  HPI/Events of Note  Abdominal Pain and Distention.   eICU Interventions  Plan: 1. Abdominal X-ray STAT.     Intervention Category Major Interventions: Other:  Lysle Dingwall 06/14/2020, 2:51 AM

## 2020-06-14 NOTE — Progress Notes (Signed)
Pt still w/ no UO this shift.  Attempted another bladder scan which continues to show >999.  Surgery at bedside, states scanner is picking up ovarian cyst, per CT ABD bladder has little to no urine.

## 2020-06-14 NOTE — Progress Notes (Signed)
NAME:  TEMIKA SUTPHIN, MRN:  [id number], DOB:  [birth year], LOS: 4 ADMISSION DATE:  06/17/2020, CONSULTATION DATE:  06/14/20 REFERRING MD:  Gifford Shave, MD, CHIEF COMPLAINT:  Weak  Brief History   Ms. Vickki NOURA PURPURA is a 59 y.o. 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  She was down for 13-14 hours, until her sister came to check on her and found her on the floor.  The patient reports not eating or drinking well over the past 2 weeks and has been feeling progressively weaker.  She lives alone and normally walks with a cane.  She denies any fever, cough, congestion, runny nose, pain with urination or diarrhea.  She has chronic venous stasis ulcers BLE, follows with Dr. Sharol Given, and states that she feels like her wounds are improving and denies increased redness, pain or edema.   Upon arrival to the ED, code sepsis was called due to hypotension and hypothermia (96.6 F). Lactic acid 2.0, WBC 12.0, hemoglobin 11.5, potassium 2.8, creatinine 2.40, albumin 1.4.  UA showed hazy urine with protein 30, few bacteria.  Chest x-ray showed no acute disease. Initial EKG showed sinus rhythm with PVCs, repeat EKG showed right bundle branch block with prolonged QTc  of 561.  Potassium was repleted with 10 mEq x3 and IV fluids were started at 30 cc/kg, 1 L of 5% albumin and another liter of crystalloid.  Past Medical History   Past Medical History:  Diagnosis Date  . Arthritis   . Asthma   . CHF (congestive heart failure) ([place]) 03/12/2014   a. EF 40-45% by echo in 06/2017 with normal cors by cath. ICD placed following  VT arrest  . Depression   . Headache(784.0)   . History of blood transfusion   . Hyperlipidemia   . Hypertension   . Lung nodules   . Myocardial infarction ([place]) 06/26/2014  . Renal disorder    followed by [facility]  . Sarcoidosis   . Ulcer    recurring, from chronic venous insufficiency   Significant Hospital Events     Consults:  PCCM  Procedures:  None  Significant Diagnostic Tests:  Echo 6/19 > EF 50-55%. CT A / P 6/22 >   Micro Data:  SARS Coronavirus 2 - Negative Blood: pending Urine: pending  Antimicrobials:  Vancomycin 6/18 > 6/21 Cefepime 6/18 > 6/21.  6/22 >  Ceftriaxone 6/21 > 6/22.  Interim history/subjective:  BP back down yesterday afternoon so transfer order cancelled.  Lab unable to collect labs despite 3 attempts. CVL placed. CT abd / pelv ordered given abd distention and pain.  Objective   Blood pressure (!) 77/61, pulse 64, temperature (!) 94.5 F (34.7 C), temperature source Core, resp. rate 20, height 5\' 7"  (1.702 m), weight 110.3 kg, last menstrual period 11/21/2011, SpO2 100 %.        Intake/Output Summary (Last 24 hours) at 06/14/2020 1001 Last data filed at 06/14/2020 0600 Gross per 24 hour  Intake 1347.88 ml  Output 201 ml  Net 1146.88 ml   Filed Weights   05/25/2020 1316 05/26/2020 2320 06/11/20 1430  Weight: 102.1 kg 107.2  kg 110.3 kg   Examination: General: Adult female, resting in bed, in NAD. Neuro: A&O x 3, no deficits. HEENT: MM dry.  [place]/AT. Sclerae anicteric. EOMI. Cardiovascular: RRR, no M/R/G. Occasional PVC's on monitor. Lungs: Respirations even and unlabored.  CTA bilaterally, No W/R/R.  Abdomen: BS hypoactive. Slightly distended.  Tender to palpation. Musculoskeletal: No gross deformities, no edema.  Wounds as seen in photos below. Skin: Leg dressings C/D/I.  Intact, warm, no rashes.      Assessment & Plan:   Undifferentiated shock - Most likely hypovolemic + septic of unclear etiology.  Cultures remain  negative but PCT elevated.  Possibly could be mixed with cardiogenic with progressive cardiomyopathy. - Continue neosynephrine and add norepinephrine. - Escalate abx (d/c ceftriaxone and back to cefepime.  Hold vanc for now given renal function and negative MRSA). - Assess CVP's and co-ox now that CVL is in place. - Obtain CT abd / pelv to rule out intraabdominal process given her distention, pain, lack of BM's.  Progressive weakness s/p Ground-level fall. - PT recommending SNF.  AKI on CKD stage III, likely pre-renal - due to poor oral intake past 2 weeks. - Continue supportive care. - Avoid nephrotoxic agents.  Chronic venous stasis and ulcers (followed by Dr. Sharol Given) - Continue routine wound care. - F/u with ortho as outpatient.  Protein malnutrition with severe hypoalbuminemia  - Optimize nutrition status.  Prolonged QTc. - Monitor electrolytes, replete as needed. - Repeat mag now.  Constipation. - Continue dulcolax and colace PRN. - F/u on CT.  Best practice:  Diet:  NPO. Pain/Anxiety/Delirium protocol (if indicated): n/a VAP protocol (if indicated): n/a DVT prophylaxis: Lovenox SQ GI prophylaxis: n/a Mobility:  Bedrest while hypotensive Code Status: Full Family Communication: Sister updated at bedside Disposition: ICU   Montey Hora, [facility] 06/14/2020, 10:01 AM

## 2020-06-15 ENCOUNTER — Inpatient Hospital Stay (HOSPITAL_COMMUNITY): Payer: Medicare Other | Admitting: Registered Nurse

## 2020-06-15 ENCOUNTER — Other Ambulatory Visit: Payer: Self-pay

## 2020-06-15 ENCOUNTER — Encounter (HOSPITAL_COMMUNITY): Admission: AD | Disposition: E | Payer: Self-pay | Source: Home / Self Care | Attending: Critical Care Medicine

## 2020-06-15 ENCOUNTER — Inpatient Hospital Stay (HOSPITAL_COMMUNITY): Payer: Medicare Other

## 2020-06-15 DIAGNOSIS — R579 Shock, unspecified: Secondary | ICD-10-CM

## 2020-06-15 DIAGNOSIS — K56609 Unspecified intestinal obstruction, unspecified as to partial versus complete obstruction: Secondary | ICD-10-CM

## 2020-06-15 DIAGNOSIS — I493 Ventricular premature depolarization: Secondary | ICD-10-CM

## 2020-06-15 DIAGNOSIS — I428 Other cardiomyopathies: Secondary | ICD-10-CM

## 2020-06-15 DIAGNOSIS — G9340 Encephalopathy, unspecified: Secondary | ICD-10-CM

## 2020-06-15 DIAGNOSIS — J9601 Acute respiratory failure with hypoxia: Secondary | ICD-10-CM

## 2020-06-15 HISTORY — PX: BOWEL RESECTION: SHX1257

## 2020-06-15 HISTORY — PX: LAPAROTOMY: SHX154

## 2020-06-15 LAB — POCT I-STAT 7, (LYTES, BLD GAS, ICA,H+H)
Acid-base deficit: 7 mmol/L — ABNORMAL HIGH (ref 0.0–2.0)
Acid-base deficit: 8 mmol/L — ABNORMAL HIGH (ref 0.0–2.0)
Acid-base deficit: 6 mmol/L — ABNORMAL HIGH (ref 0.0–2.0)
Bicarbonate: 16.6 mmol/L — ABNORMAL LOW (ref 20.0–28.0)
Bicarbonate: 15 mmol/L — ABNORMAL LOW (ref 20.0–28.0)
Bicarbonate: 17 mmol/L — ABNORMAL LOW (ref 20.0–28.0)
Calcium, Ion: 0.97 mmol/L — ABNORMAL LOW (ref 1.15–1.40)
Calcium, Ion: 0.92 mmol/L — ABNORMAL LOW (ref 1.15–1.40)
Calcium, Ion: 1.01 mmol/L — ABNORMAL LOW (ref 1.15–1.40)
HCT: 33 % — ABNORMAL LOW (ref 36.0–46.0)
HCT: 27 % — ABNORMAL LOW (ref 36.0–46.0)
HCT: 43 % (ref 36.0–46.0)
Hemoglobin: 11.2 g/dL — ABNORMAL LOW (ref 12.0–15.0)
Hemoglobin: 9.2 g/dL — ABNORMAL LOW (ref 12.0–15.0)
Hemoglobin: 14.6 g/dL (ref 12.0–15.0)
O2 Saturation: 100 %
O2 Saturation: 100 %
O2 Saturation: 99 %
Patient temperature: 97.6
Patient temperature: 98.6
Potassium: 3.1 mmol/L — ABNORMAL LOW (ref 3.5–5.1)
Potassium: 2.9 mmol/L — ABNORMAL LOW (ref 3.5–5.1)
Potassium: 3.6 mmol/L (ref 3.5–5.1)
Sodium: 149 mmol/L — ABNORMAL HIGH (ref 135–145)
Sodium: 150 mmol/L — ABNORMAL HIGH (ref 135–145)
Sodium: 148 mmol/L — ABNORMAL HIGH (ref 135–145)
TCO2: 17 mmol/L — ABNORMAL LOW (ref 22–32)
TCO2: 16 mmol/L — ABNORMAL LOW (ref 22–32)
TCO2: 18 mmol/L — ABNORMAL LOW (ref 22–32)
pCO2 arterial: 25.8 mmHg — ABNORMAL LOW (ref 32.0–48.0)
pCO2 arterial: 23.5 mmHg — ABNORMAL LOW (ref 32.0–48.0)
pCO2 arterial: 25.8 mmHg — ABNORMAL LOW (ref 32.0–48.0)
pH, Arterial: 7.414 (ref 7.350–7.450)
pH, Arterial: 7.413 (ref 7.350–7.450)
pH, Arterial: 7.427 (ref 7.350–7.450)
pO2, Arterial: 233 mmHg — ABNORMAL HIGH (ref 83.0–108.0)
pO2, Arterial: 264 mmHg — ABNORMAL HIGH (ref 83.0–108.0)
pO2, Arterial: 125 mmHg — ABNORMAL HIGH (ref 83.0–108.0)

## 2020-06-15 LAB — BASIC METABOLIC PANEL
Anion gap: 19 — ABNORMAL HIGH (ref 5–15)
Anion gap: 20 — ABNORMAL HIGH (ref 5–15)
BUN: 79 mg/dL — ABNORMAL HIGH (ref 6–20)
BUN: 78 mg/dL — ABNORMAL HIGH (ref 6–20)
CO2: 17 mmol/L — ABNORMAL LOW (ref 22–32)
CO2: 19 mmol/L — ABNORMAL LOW (ref 22–32)
Calcium: 6.9 mg/dL — ABNORMAL LOW (ref 8.9–10.3)
Calcium: 7.2 mg/dL — ABNORMAL LOW (ref 8.9–10.3)
Chloride: 113 mmol/L — ABNORMAL HIGH (ref 98–111)
Chloride: 111 mmol/L (ref 98–111)
Creatinine, Ser: 2.58 mg/dL — ABNORMAL HIGH (ref 0.44–1.00)
Creatinine, Ser: 2.8 mg/dL — ABNORMAL HIGH (ref 0.44–1.00)
GFR calc Af Amer: 23 mL/min — ABNORMAL LOW (ref >60)
GFR calc Af Amer: 21 mL/min — ABNORMAL LOW (ref >60)
GFR calc non Af Amer: 20 mL/min — ABNORMAL LOW (ref >60)
GFR calc non Af Amer: 18 mL/min — ABNORMAL LOW (ref >60)
Glucose, Bld: 180 mg/dL — ABNORMAL HIGH (ref 70–99)
Glucose, Bld: 218 mg/dL — ABNORMAL HIGH (ref 70–99)
Potassium: 3.1 mmol/L — ABNORMAL LOW (ref 3.5–5.1)
Potassium: 3.6 mmol/L (ref 3.5–5.1)
Sodium: 149 mmol/L — ABNORMAL HIGH (ref 135–145)
Sodium: 150 mmol/L — ABNORMAL HIGH (ref 135–145)

## 2020-06-15 LAB — CBC
HCT: 32.8 % — ABNORMAL LOW (ref 36.0–46.0)
HCT: 41.6 % (ref 36.0–46.0)
HCT: 44.2 % (ref 36.0–46.0)
Hemoglobin: 11 g/dL — ABNORMAL LOW (ref 12.0–15.0)
Hemoglobin: 14.3 g/dL (ref 12.0–15.0)
Hemoglobin: 14.9 g/dL (ref 12.0–15.0)
MCH: 30.8 pg (ref 26.0–34.0)
MCH: 30.3 pg (ref 26.0–34.0)
MCH: 29.9 pg (ref 26.0–34.0)
MCHC: 33.5 g/dL (ref 30.0–36.0)
MCHC: 34.4 g/dL (ref 30.0–36.0)
MCHC: 33.7 g/dL (ref 30.0–36.0)
MCV: 91.9 fL (ref 80.0–100.0)
MCV: 88.1 fL (ref 80.0–100.0)
MCV: 88.6 fL (ref 80.0–100.0)
Platelets: 162 10*3/uL (ref 150–400)
Platelets: 91 10*3/uL — ABNORMAL LOW (ref 150–400)
Platelets: 82 10*3/uL — ABNORMAL LOW (ref 150–400)
RBC: 3.57 MIL/uL — ABNORMAL LOW (ref 3.87–5.11)
RBC: 4.72 MIL/uL (ref 3.87–5.11)
RBC: 4.99 MIL/uL (ref 3.87–5.11)
RDW: 17.5 % — ABNORMAL HIGH (ref 11.5–15.5)
RDW: 16.1 % — ABNORMAL HIGH (ref 11.5–15.5)
RDW: 16.4 % — ABNORMAL HIGH (ref 11.5–15.5)
WBC: 17.2 10*3/uL — ABNORMAL HIGH (ref 4.0–10.5)
WBC: 11.3 10*3/uL — ABNORMAL HIGH (ref 4.0–10.5)
WBC: 13.8 10*3/uL — ABNORMAL HIGH (ref 4.0–10.5)
nRBC: 0.4 % — ABNORMAL HIGH (ref 0.0–0.2)
nRBC: 0.5 % — ABNORMAL HIGH (ref 0.0–0.2)
nRBC: 0.3 % — ABNORMAL HIGH (ref 0.0–0.2)

## 2020-06-15 LAB — COMPREHENSIVE METABOLIC PANEL
ALT: 22 U/L (ref 0–44)
ALT: 18 U/L (ref 0–44)
AST: 21 U/L (ref 15–41)
AST: 20 U/L (ref 15–41)
Albumin: 1.4 g/dL — ABNORMAL LOW (ref 3.5–5.0)
Albumin: 1.7 g/dL — ABNORMAL LOW (ref 3.5–5.0)
Alkaline Phosphatase: 54 U/L (ref 38–126)
Alkaline Phosphatase: 48 U/L (ref 38–126)
Anion gap: 16 — ABNORMAL HIGH (ref 5–15)
Anion gap: 20 — ABNORMAL HIGH (ref 5–15)
BUN: 85 mg/dL — ABNORMAL HIGH (ref 6–20)
BUN: 79 mg/dL — ABNORMAL HIGH (ref 6–20)
CO2: 17 mmol/L — ABNORMAL LOW (ref 22–32)
CO2: 16 mmol/L — ABNORMAL LOW (ref 22–32)
Calcium: 7.2 mg/dL — ABNORMAL LOW (ref 8.9–10.3)
Calcium: 7.2 mg/dL — ABNORMAL LOW (ref 8.9–10.3)
Chloride: 115 mmol/L — ABNORMAL HIGH (ref 98–111)
Chloride: 113 mmol/L — ABNORMAL HIGH (ref 98–111)
Creatinine, Ser: 2.77 mg/dL — ABNORMAL HIGH (ref 0.44–1.00)
Creatinine, Ser: 2.71 mg/dL — ABNORMAL HIGH (ref 0.44–1.00)
GFR calc Af Amer: 21 mL/min — ABNORMAL LOW (ref >60)
GFR calc Af Amer: 22 mL/min — ABNORMAL LOW (ref >60)
GFR calc non Af Amer: 18 mL/min — ABNORMAL LOW (ref >60)
GFR calc non Af Amer: 19 mL/min — ABNORMAL LOW (ref >60)
Glucose, Bld: 130 mg/dL — ABNORMAL HIGH (ref 70–99)
Glucose, Bld: 203 mg/dL — ABNORMAL HIGH (ref 70–99)
Potassium: 3.6 mmol/L (ref 3.5–5.1)
Potassium: 3.8 mmol/L (ref 3.5–5.1)
Sodium: 148 mmol/L — ABNORMAL HIGH (ref 135–145)
Sodium: 149 mmol/L — ABNORMAL HIGH (ref 135–145)
Total Bilirubin: 1.2 mg/dL (ref 0.3–1.2)
Total Bilirubin: 1.7 mg/dL — ABNORMAL HIGH (ref 0.3–1.2)
Total Protein: 4.2 g/dL — ABNORMAL LOW (ref 6.5–8.1)
Total Protein: 4 g/dL — ABNORMAL LOW (ref 6.5–8.1)

## 2020-06-15 LAB — PHOSPHORUS
Phosphorus: 3.5 mg/dL (ref 2.5–4.6)
Phosphorus: 3.8 mg/dL (ref 2.5–4.6)

## 2020-06-15 LAB — GLUCOSE, CAPILLARY
Glucose-Capillary: 99 mg/dL (ref 70–99)
Glucose-Capillary: 108 mg/dL — ABNORMAL HIGH (ref 70–99)
Glucose-Capillary: 121 mg/dL — ABNORMAL HIGH (ref 70–99)
Glucose-Capillary: 179 mg/dL — ABNORMAL HIGH (ref 70–99)
Glucose-Capillary: 183 mg/dL — ABNORMAL HIGH (ref 70–99)

## 2020-06-15 LAB — CULTURE, BLOOD (ROUTINE X 2)
Culture: NO GROWTH
Culture: NO GROWTH

## 2020-06-15 LAB — MAGNESIUM
Magnesium: 2.4 mg/dL (ref 1.7–2.4)
Magnesium: 2.7 mg/dL — ABNORMAL HIGH (ref 1.7–2.4)

## 2020-06-15 LAB — PREPARE RBC (CROSSMATCH)

## 2020-06-15 LAB — PROTIME-INR
INR: 1.8 — ABNORMAL HIGH (ref 0.8–1.2)
INR: 1.7 — ABNORMAL HIGH (ref 0.8–1.2)
Prothrombin Time: 19.9 seconds — ABNORMAL HIGH (ref 11.4–15.2)
Prothrombin Time: 19.8 seconds — ABNORMAL HIGH (ref 11.4–15.2)

## 2020-06-15 LAB — SURGICAL PCR SCREEN
MRSA, PCR: NEGATIVE
Staphylococcus aureus: POSITIVE — AB

## 2020-06-15 LAB — LACTIC ACID, PLASMA
Lactic Acid, Venous: 4.4 mmol/L (ref 0.5–1.9)
Lactic Acid, Venous: 5.5 mmol/L (ref 0.5–1.9)
Lactic Acid, Venous: 5.6 mmol/L (ref 0.5–1.9)
Lactic Acid, Venous: 6.8 mmol/L (ref 0.5–1.9)

## 2020-06-15 SURGERY — LAPAROTOMY, EXPLORATORY
Anesthesia: General | Site: Abdomen

## 2020-06-15 MED ORDER — ORAL CARE MOUTH RINSE
15.0000 mL | OROMUCOSAL | Status: DC
  Administered 2020-06-15 – 2020-06-18 (×27): 15 mL via OROMUCOSAL

## 2020-06-15 MED ORDER — LACTATED RINGERS IV BOLUS
1000.0000 mL | Freq: Once | INTRAVENOUS | Status: AC
  Administered 2020-06-15: 1000 mL via INTRAVENOUS

## 2020-06-15 MED ORDER — FENTANYL CITRATE (PF) 100 MCG/2ML IJ SOLN: 50.0000 ug | INTRAMUSCULAR | Status: DC | PRN

## 2020-06-15 MED ORDER — ROCURONIUM BROMIDE 10 MG/ML (PF) SYRINGE
PREFILLED_SYRINGE | INTRAVENOUS | Status: DC | PRN
  Administered 2020-06-15 (×2): 50 mg via INTRAVENOUS

## 2020-06-15 MED ORDER — CALCIUM CHLORIDE 10 % IV SOLN
INTRAVENOUS | Status: DC | PRN
Start: 2020-06-15 — End: 2020-06-15
  Administered 2020-06-15: 1 g via INTRAVENOUS

## 2020-06-15 MED ORDER — PHENYLEPHRINE HCL-NACL 10-0.9 MG/250ML-% IV SOLN
INTRAVENOUS | Status: AC
  Filled 2020-06-15: qty 250

## 2020-06-15 MED ORDER — SODIUM CHLORIDE 0.9 % IV BOLUS
1000.0000 mL | Freq: Once | INTRAVENOUS | Status: AC
  Administered 2020-06-15: 1000 mL via INTRAVENOUS

## 2020-06-15 MED ORDER — 0.9 % SODIUM CHLORIDE (POUR BTL) OPTIME
TOPICAL | Status: DC | PRN
  Administered 2020-06-15 (×2): 2000 mL

## 2020-06-15 MED ORDER — LACTATED RINGERS IV SOLN: INTRAVENOUS | Status: DC | PRN

## 2020-06-15 MED ORDER — FENTANYL 2500MCG IN NS 250ML (10MCG/ML) PREMIX INFUSION: 50.0000 ug/h | INTRAVENOUS | Status: DC

## 2020-06-15 MED ORDER — CHLORHEXIDINE GLUCONATE 0.12% ORAL RINSE (MEDLINE KIT)
15.0000 mL | Freq: Two times a day (BID) | OROMUCOSAL | Status: DC
  Administered 2020-06-15 – 2020-06-17 (×2): 15 mL via OROMUCOSAL

## 2020-06-15 MED ORDER — MIDAZOLAM HCL 2 MG/2ML IJ SOLN
INTRAMUSCULAR | Status: AC
  Filled 2020-06-15: qty 2

## 2020-06-15 MED ORDER — EPHEDRINE SULFATE-NACL 50-0.9 MG/10ML-% IV SOSY
PREFILLED_SYRINGE | INTRAVENOUS | Status: DC | PRN
  Administered 2020-06-15: 10 mg via INTRAVENOUS

## 2020-06-15 MED ORDER — VASOPRESSIN 20 UNIT/ML IV SOLN: 0.0300 [IU]/min | INTRAVENOUS | Status: DC

## 2020-06-15 MED ORDER — DOCUSATE SODIUM 50 MG/5ML PO LIQD
100.0000 mg | Freq: Two times a day (BID) | ORAL | Status: DC
  Filled 2020-06-15: qty 10

## 2020-06-15 MED ORDER — SODIUM BICARBONATE 8.4 % IV SOLN
INTRAVENOUS | Status: DC | PRN
  Administered 2020-06-15 (×2): 50 meq via INTRAVENOUS

## 2020-06-15 MED ORDER — PROPOFOL 10 MG/ML IV BOLUS
INTRAVENOUS | Status: AC
  Filled 2020-06-15: qty 20

## 2020-06-15 MED ORDER — FENTANYL CITRATE (PF) 250 MCG/5ML IJ SOLN
INTRAMUSCULAR | Status: AC
  Filled 2020-06-15: qty 5

## 2020-06-15 MED ORDER — METRONIDAZOLE IN NACL 5-0.79 MG/ML-% IV SOLN
500.0000 mg | Freq: Three times a day (TID) | INTRAVENOUS | Status: DC
  Administered 2020-06-15 – 2020-06-18 (×9): 500 mg via INTRAVENOUS
  Filled 2020-06-15 (×8): qty 100

## 2020-06-15 MED ORDER — ALBUMIN HUMAN 5 % IV SOLN
INTRAVENOUS | Status: DC | PRN
Start: 2020-06-15 — End: 2020-06-15

## 2020-06-15 MED ORDER — POLYETHYLENE GLYCOL 3350 17 G PO PACK: 17.0000 g | PACK | Freq: Every day | ORAL | Status: DC

## 2020-06-15 MED ORDER — FENTANYL CITRATE (PF) 100 MCG/2ML IJ SOLN
50.0000 ug | INTRAMUSCULAR | Status: DC | PRN
  Administered 2020-06-16 (×2): 50 ug via INTRAVENOUS
  Filled 2020-06-15 (×2): qty 2

## 2020-06-15 MED ORDER — POTASSIUM CHLORIDE 10 MEQ/50ML IV SOLN
10.0000 meq | INTRAVENOUS | Status: AC
  Administered 2020-06-15: 50 mL via INTRAVENOUS
  Administered 2020-06-15 (×2): 10 meq via INTRAVENOUS
  Filled 2020-06-15: qty 50

## 2020-06-15 MED ORDER — CALCIUM GLUCONATE-NACL 2-0.675 GM/100ML-% IV SOLN
2.0000 g | Freq: Once | INTRAVENOUS | Status: AC
  Administered 2020-06-15: 2000 mg via INTRAVENOUS
  Filled 2020-06-15: qty 100

## 2020-06-15 MED ORDER — ORAL CARE MOUTH RINSE: 15.0000 mL | OROMUCOSAL | Status: DC

## 2020-06-15 MED ORDER — MIDAZOLAM HCL 2 MG/2ML IJ SOLN
2.0000 mg | Freq: Once | INTRAMUSCULAR | Status: AC
  Administered 2020-06-15: 2 mg via INTRAVENOUS
  Filled 2020-06-15: qty 2

## 2020-06-15 MED ORDER — SODIUM CHLORIDE 0.9 % IV SOLN: 2.0000 g | INTRAVENOUS | Status: DC

## 2020-06-15 MED ORDER — PANTOPRAZOLE SODIUM 40 MG IV SOLR
40.0000 mg | INTRAVENOUS | Status: DC
  Administered 2020-06-15 – 2020-06-18 (×4): 40 mg via INTRAVENOUS
  Filled 2020-06-15 (×4): qty 40

## 2020-06-15 MED ORDER — FENTANYL BOLUS VIA INFUSION
50.0000 ug | INTRAVENOUS | Status: DC | PRN
  Filled 2020-06-15: qty 50

## 2020-06-15 MED ORDER — ROCURONIUM BROMIDE 10 MG/ML (PF) SYRINGE
1.0000 mg/kg | PREFILLED_SYRINGE | Freq: Once | INTRAVENOUS | Status: AC
  Administered 2020-06-15: 110.3 mg via INTRAVENOUS
  Filled 2020-06-15: qty 20

## 2020-06-15 MED ORDER — SODIUM CHLORIDE 0.9% IV SOLUTION: Freq: Once | INTRAVENOUS | Status: AC

## 2020-06-15 MED ORDER — ALBUMIN HUMAN 5 % IV SOLN
12.5000 g | Freq: Once | INTRAVENOUS | Status: AC
  Administered 2020-06-15: 12.5 g via INTRAVENOUS
  Filled 2020-06-15: qty 250

## 2020-06-15 MED ORDER — DEXAMETHASONE SODIUM PHOSPHATE 10 MG/ML IJ SOLN
INTRAMUSCULAR | Status: DC | PRN
  Administered 2020-06-15: 5 mg via INTRAVENOUS

## 2020-06-15 MED ORDER — MAGNESIUM SULFATE 2 GM/50ML IV SOLN
2.0000 g | Freq: Once | INTRAVENOUS | Status: AC
  Administered 2020-06-15: 2 g via INTRAVENOUS
  Filled 2020-06-15: qty 50

## 2020-06-15 MED ORDER — PROPOFOL 10 MG/ML IV BOLUS
INTRAVENOUS | Status: DC | PRN
  Administered 2020-06-15: 30 mg via INTRAVENOUS

## 2020-06-15 MED ORDER — FENTANYL CITRATE (PF) 100 MCG/2ML IJ SOLN
50.0000 ug | Freq: Once | INTRAMUSCULAR | Status: AC
  Administered 2020-06-15: 100 ug via INTRAVENOUS

## 2020-06-15 MED ORDER — FENTANYL CITRATE (PF) 100 MCG/2ML IJ SOLN
100.0000 ug | Freq: Once | INTRAMUSCULAR | Status: AC
  Filled 2020-06-15: qty 2

## 2020-06-15 MED ORDER — PHENYLEPHRINE 40 MCG/ML (10ML) SYRINGE FOR IV PUSH (FOR BLOOD PRESSURE SUPPORT)
PREFILLED_SYRINGE | INTRAVENOUS | Status: DC | PRN
  Administered 2020-06-15: 120 ug via INTRAVENOUS
  Administered 2020-06-15: 160 ug via INTRAVENOUS
  Administered 2020-06-15: 120 ug via INTRAVENOUS

## 2020-06-15 MED ORDER — ONDANSETRON HCL 4 MG/2ML IJ SOLN
INTRAMUSCULAR | Status: DC | PRN
  Administered 2020-06-15: 4 mg via INTRAVENOUS

## 2020-06-15 MED ORDER — MIDAZOLAM HCL 5 MG/5ML IJ SOLN
INTRAMUSCULAR | Status: DC | PRN
  Administered 2020-06-15: 2 mg via INTRAVENOUS

## 2020-06-15 MED ORDER — CHLORHEXIDINE GLUCONATE 0.12% ORAL RINSE (MEDLINE KIT)
15.0000 mL | Freq: Two times a day (BID) | OROMUCOSAL | Status: DC
  Administered 2020-06-15 – 2020-06-18 (×5): 15 mL via OROMUCOSAL

## 2020-06-15 SURGICAL SUPPLY — 42 items
BLADE CLIPPER SURG (BLADE) IMPLANT
CANISTER SUCT 3000ML PPV (MISCELLANEOUS) ×3 IMPLANT
CANISTER WOUNDNEG PRESSURE 500 (CANNISTER) ×2 IMPLANT
CHLORAPREP W/TINT 26 (MISCELLANEOUS) ×3 IMPLANT
COVER SURGICAL LIGHT HANDLE (MISCELLANEOUS) ×3 IMPLANT
COVER WAND RF STERILE (DRAPES) ×3 IMPLANT
DRAPE LAPAROSCOPIC ABDOMINAL (DRAPES) ×3 IMPLANT
DRAPE WARM FLUID 44X44 (DRAPES) ×3 IMPLANT
DRSG OPSITE POSTOP 4X10 (GAUZE/BANDAGES/DRESSINGS) IMPLANT
DRSG OPSITE POSTOP 4X8 (GAUZE/BANDAGES/DRESSINGS) IMPLANT
ELECT BLADE 6.5 EXT (BLADE) ×2 IMPLANT
ELECT CAUTERY BLADE 6.4 (BLADE) ×3 IMPLANT
ELECT REM PT RETURN 9FT ADLT (ELECTROSURGICAL) ×3
ELECTRODE REM PT RTRN 9FT ADLT (ELECTROSURGICAL) ×1 IMPLANT
GLOVE BIO SURGEON STRL SZ7.5 (GLOVE) ×3 IMPLANT
GOWN STRL REUS W/ TWL LRG LVL3 (GOWN DISPOSABLE) ×2 IMPLANT
GOWN STRL REUS W/TWL LRG LVL3 (GOWN DISPOSABLE) ×6
HANDLE SUCTION POOLE (INSTRUMENTS) ×1 IMPLANT
KIT BASIN OR (CUSTOM PROCEDURE TRAY) ×3 IMPLANT
KIT TURNOVER KIT B (KITS) ×3 IMPLANT
LIGASURE IMPACT 36 18CM CVD LR (INSTRUMENTS) ×2 IMPLANT
NS IRRIG 1000ML POUR BTL (IV SOLUTION) ×6 IMPLANT
PACK GENERAL/GYN (CUSTOM PROCEDURE TRAY) ×3 IMPLANT
PAD ARMBOARD 7.5X6 YLW CONV (MISCELLANEOUS) ×3 IMPLANT
PENCIL SMOKE EVACUATOR (MISCELLANEOUS) ×3 IMPLANT
RELOAD PROXIMATE 75MM BLUE (ENDOMECHANICALS) ×6 IMPLANT
RELOAD STAPLE 75 3.8 BLU REG (ENDOMECHANICALS) IMPLANT
SPECIMEN JAR LARGE (MISCELLANEOUS) ×2 IMPLANT
SPONGE ABDOMINAL VAC ABTHERA (MISCELLANEOUS) ×2 IMPLANT
SPONGE LAP 18X18 RF (DISPOSABLE) IMPLANT
STAPLER PROXIMATE 75MM BLUE (STAPLE) ×2 IMPLANT
STAPLER VISISTAT 35W (STAPLE) ×3 IMPLANT
SUCTION POOLE HANDLE (INSTRUMENTS) ×3
SUT PDS AB 1 TP1 96 (SUTURE) ×6 IMPLANT
SUT SILK 2 0 SH CR/8 (SUTURE) ×3 IMPLANT
SUT SILK 2 0 TIES 10X30 (SUTURE) ×3 IMPLANT
SUT SILK 3 0 SH CR/8 (SUTURE) ×3 IMPLANT
SUT SILK 3 0 TIES 10X30 (SUTURE) ×3 IMPLANT
SUT VIC AB 3-0 SH 18 (SUTURE) IMPLANT
TOWEL GREEN STERILE (TOWEL DISPOSABLE) ×3 IMPLANT
TRAY FOLEY MTR SLVR 16FR STAT (SET/KITS/TRAYS/PACK) IMPLANT
YANKAUER SUCT BULB TIP NO VENT (SUCTIONS) IMPLANT

## 2020-06-15 NOTE — H&P (View-Only) (Signed)
[facility] Progress Note     Subjective: Patient less responsive this AM. Has become more distended/full in upper abdomen overnight. Not making much urine and what is there appears very concentrated. Still no etiology for sepsis identified.   Objective: Vital signs in last 24 hours: Temp:  [94.8 F (34.9 C)-100.8 F (38.2 C)] 100.8 F (38.2 C) (06/23 0600) Pulse Rate:  [25-88] 79 (06/23 0700) Resp:  [16-38] 29 (06/23 0700) BP: (41-107)/(20-77) 99/66 (06/23 0700) SpO2:  [92 %-100 %] 100 % (06/23 0700) Last BM Date: 06/14/20  Intake/Output from previous day: 06/22 0701 - 06/23 0700 In: 5187.6 [I.V.:3487.1; NG/GT:500; IV Piggyback:1200.5] Out: 805 [Urine:80; Emesis/NG output:725] Intake/Output this shift: No intake/output data recorded.  PE: General: lethargic, obese, black female who is laying in bed in NAD HEENT: head is normocephalic, atraumatic.  Sclera are noninjected.  PERRL.  Ears and nose without any masses or lesions.  Nose with NGT in place with dark red-brown output. Mouth is pink and dry Heart: regular, rate, and rhythm.  Normal s1,s2. No obvious murmurs, gallops, or rubs noted.  Palpable radial and pedal pulses bilaterally Lungs: CTAB, no wheezes, rhonchi, or rales noted.  Respiratory effort nonlabored Abd: soft, very tender over her reduced umbilical hernia, very small 1cm defect. Otherwise not really tender anywhere else.  No rebounding, guarding, or peritoneal signs. Fullness/distention in upper abdomen MS: all 4 extremities are symmetrical with no cyanosis, clubbing, or edema. Skin: warm and dry with no masses, lesions, or rashes except BLE ulcers that are currently covered.  Pictures in the chart noted Psych: lethargic.  Decreased mentation, no longer answering any questions   Lab Results:  Recent Labs    06/14/20 1049 06/14/20 1049 06/14/20 2029 06/06/2020 0519  WBC 13.6*  --   --  17.2*  HGB 11.4*   < > 11.9* 11.0*  HCT 34.8*   < > 35.0*  32.8*  PLT 191  --   --  162   < > = values in this interval not displayed.   BMET Recent Labs    06/14/20 1547 06/14/20 1547 06/14/20 2029 06/14/2020 0519  NA 148*   < > 148* 148*  K 3.8   < > 3.8 3.6  CL 118*  --   --  115*  CO2 15*  --   --  17*  GLUCOSE 83  --   --  130*  BUN 84*  --   --  85*  CREATININE 2.49*  --   --  2.77*  CALCIUM 7.6*  --   --  7.2*   < > = values in this interval not displayed.   PT/INR Recent Labs    06/14/20 1812  LABPROT 19.2*  INR 1.7*   CMP     Component Value Date/Time   NA 148 (H) 05/28/2020 0519   NA 140 12/31/2019 1044   K 3.6 06/06/2020 0519   CL 115 (H) 06/10/2020 0519   CO2 17 (L) 06/02/2020 0519   GLUCOSE 130 (H) 05/30/2020 0519   BUN 85 (H) 06/20/2020 0519   BUN 18 12/31/2019 1044   CREATININE 2.77 (H) 05/31/2020 0519   CREATININE 0.83 03/31/2014 1010   CALCIUM 7.2 (L) 05/31/2020 0519   PROT 4.2 (L) 05/28/2020 0519   PROT 7.5 12/31/2019 1044   ALBUMIN 1.4 (L) 05/26/2020 0519   ALBUMIN 3.5 (L) 12/31/2019 1044   AST 21 05/26/2020 0519   ALT 22 06/13/2020 0519   ALKPHOS 54 06/01/2020 0519  BILITOT 1.2 06/17/2020 0519   BILITOT 0.7 12/31/2019 1044   GFRNONAA 18 (L) 06/14/2020 0519   GFRAA 21 (L) 06/04/2020 0519   Lipase     Component Value Date/Time   LIPASE 162 (H) 06/14/2020 1547       Studies/Results: CT ABDOMEN PELVIS WO CONTRAST  Result Date: 06/14/2020 CLINICAL DATA:  Abdominal pain and distension EXAM: CT ABDOMEN AND PELVIS WITHOUT CONTRAST TECHNIQUE: Multidetector CT imaging of the abdomen and pelvis was performed following the standard protocol without IV contrast. COMPARISON:  02/08/2020 FINDINGS: Lower chest: Minimal basilar scarring is noted. Gastric catheter is noted coiled in the stomach. Hepatobiliary: Liver demonstrates diffuse geographic decreased attenuation consistent with fatty infiltration. Gallbladder is well distended with hyperdense material as well as a few small gallstones. No wall  thickening or pericholecystic fluid is noted. Pancreas: Unremarkable. No pancreatic ductal dilatation or surrounding inflammatory changes. Spleen: Normal in size without focal abnormality. Adrenals/Urinary Tract: Adrenal glands are within normal limits. Kidneys demonstrate no renal calculi or urinary tract obstructive changes. The bladder is partially distended. Stomach/Bowel: Colon is decompressed. The appendix is not well visualized. No inflammatory changes to suggest appendicitis are noted. Significant small bowel dilatation is noted throughout almost the entire small bowel with a transition zone in the right mid abdomen. No definitive mass lesion is seen to correspond with the transition zone although it appears somewhat similar to that seen on prior exam. Gastric catheter extends into the stomach. Vascular/Lymphatic: No significant vascular findings are present. No enlarged abdominal or pelvic lymph nodes. Reproductive: Uterus is within normal limits. There is again noted a large cystic mass lesion arising likely from the left ovary measuring approximately 21.7 cm in greatest transverse dimension. This is roughly similar to that seen on the prior exam. Other: Mild free fluid is noted slightly increased from the prior exam. Mild changes of anasarca are noted. Fat containing umbilical hernia is again seen and stable. Musculoskeletal: Degenerative changes of lumbar spine are noted. IMPRESSION: Increase in the degree of small-bowel obstruction with a transition zone in the right mid abdomen although no definitive mass lesion is seen. Stable large pelvic cystic mass likely ovarian in etiology. Cholelithiasis. Fatty infiltration of the liver. Electronically Signed   By: Inez Catalina M.D.   On: 06/14/2020 13:22   DG Abd 1 View  Result Date: 06/14/2020 CLINICAL DATA:  59 year old female with small bowel obstruction. EXAM: ABDOMEN - 1 VIEW COMPARISON:  CT abdomen pelvis dated 06/14/2020. FINDINGS: Enteric tube with  tip in the mid to distal stomach. Mildly dilated small bowel loops in the upper abdomen measuring 4.4 cm in diameter. IMPRESSION: Enteric tube with tip in the mid to distal stomach. Electronically Signed   By: Anner Crete M.D.   On: 06/14/2020 20:03   DG Abd 1 View  Result Date: 06/14/2020 CLINICAL DATA:  Abdominal distention.  Vomiting. EXAM: ABDOMEN - 1 VIEW COMPARISON:  CT 02/08/2020. FINDINGS: Several loops of slightly prominent small bowel noted. No colonic distention. Stool in the colon. No free air visualized. No pathologic intra-abdominal calcifications identified. Increased density noted in the pelvis possibly related to previously identified large cystic pelvic mass. No acute bony abnormality. IMPRESSION: 1. Several loops of slightly prominent small bowel noted. Follow-up exam to demonstrate resolution suggested. 2. Increased density noted the pelvis possibly related to previously identified large cystic pelvic mass. Electronically Signed   By: Marcello Moores  Register   On: 06/14/2020 05:22   DG CHEST PORT 1 VIEW  Result Date: 06/14/2020 CLINICAL  DATA:  New nasogastric tube and central line EXAM: PORTABLE CHEST 1 VIEW COMPARISON:  06/19/2020 FINDINGS: Nasogastric tube is looped in the stomach, with the tip at the gastric body to antrum. The upper chest is minimally excluded. Pacer/AICD device. Right internal jugular line tip at mid right atrium. Normal heart size for level of inspiration. No pleural effusion or pneumothorax. No congestive failure. No lobar consolidation. Low lung volumes with resultant pulmonary interstitial prominence. IMPRESSION: Nasogastric tube appropriately positioned. Right-sided internal jugular line tip at mid right atrium. No pneumothorax or other acute complication. Low lung volumes, without acute disease. Electronically Signed   By: Abigail Miyamoto M.D.   On: 06/14/2020 11:07    Anti-infectives: Anti-infectives (From admission, onward)   Start     Dose/Rate Route  Frequency Ordered Stop   06/14/20 1230  metroNIDAZOLE (FLAGYL) IVPB 500 mg     Discontinue     500 mg 100 mL/hr over 60 Minutes Intravenous Every 8 hours 06/14/20 1142     06/14/20 1100  ceFEPIme (MAXIPIME) 2 g in sodium chloride 0.9 % 100 mL IVPB     Discontinue     2 g 200 mL/hr over 30 Minutes Intravenous Every 12 hours 06/14/20 1003     06/13/20 1030  cefTRIAXone (ROCEPHIN) 2 g in sodium chloride 0.9 % 100 mL IVPB  Status:  Discontinued        2 g 200 mL/hr over 30 Minutes Intravenous Every 24 hours 06/13/20 0933 06/14/20 1003   06/11/20 1500  vancomycin (VANCOREADY) IVPB 1250 mg/250 mL  Status:  Discontinued        1,250 mg 166.7 mL/hr over 90 Minutes Intravenous Every 24 hours 06/03/2020 1815 06/13/20 0933   06/11/20 0300  ceFEPIme (MAXIPIME) 2 g in sodium chloride 0.9 % 100 mL IVPB  Status:  Discontinued        2 g 200 mL/hr over 30 Minutes Intravenous Every 12 hours 06/12/2020 1815 06/13/20 0933   06/08/2020 1400  vancomycin (VANCOREADY) IVPB 2000 mg/400 mL        2,000 mg 200 mL/hr over 120 Minutes Intravenous  Once 06/15/2020 1347 06/21/2020 1739   06/21/2020 1345  vancomycin (VANCOCIN) IVPB 1000 mg/200 mL premix  Status:  Discontinued        1,000 mg 200 mL/hr over 60 Minutes Intravenous  Once 06/11/2020 1333 06/21/2020 1347   06/15/2020 1345  ceFEPIme (MAXIPIME) 2 g in sodium chloride 0.9 % 100 mL IVPB        2 g 200 mL/hr over 30 Minutes Intravenous  Once 06/08/2020 1333 06/09/2020 1733       Assessment/Plan  Hypotension - question some component of dehydration given down time and reported couple weeks of minima oral intake CHF ICD Chronic venous stasis wounds HLD HTN Acute on Chronic kidney disease with oliguria - Cr 2.77  Sepsis, unclear etiology Abdominal pain/umbilical hernia/SBO/ileus - hernia remains reduced, although patient still appears most ttp over umbilicus.   - The rest of her abdominal exam was fairly benign.   - film overnight still shows dilated small bowel and  patient is more distended this AM - more acidotic today, lactate 4.4 - WBC 17 from 13, febrile - still no clear etiology for sepsis - plan exploratory laparotomy today - discussed with patient's sister; high risk of patient needing to remain intubated and returning to unit with open abdomen and need for return trips to the OR   FEN - NPO/NGT VTE - heparin ID - cefepime  LOS: 5 days    Norm Parcel , [facility] 06/20/2020, 7:40 AM Please see Amion for pager number during day hours 7:00am-4:30pm

## 2020-06-15 NOTE — Op Note (Signed)
06/13/2020  3:11 PM  PATIENT:  Kari [name]  59 y.o. female  PRE-OPERATIVE DIAGNOSIS:  SEPSIS OF UNKNOWN ORIGIN  POST-OPERATIVE DIAGNOSIS:  SEPSIS OF UNKNOWN ORIGIN  PROCEDURE:  Procedure(s): EXPLORATORY LAPAROTOMY, RESECTION TERMINAL ILEUM AND CECUM APPLICATION OPEN ABDOMEN WOUND VAC  SURGEON:  Surgeon(s) and Role:    * Jovita Kussmaul, MD - Primary    * Lovick, Montel Culver, MD - Assisting  PHYSICIAN ASSISTANT:   ASSISTANTS: Barkley Boards, PA   ANESTHESIA:   general  EBL:  100cc   BLOOD ADMINISTERED:2 units PRBC  DRAINS: none   LOCAL MEDICATIONS USED:  NONE  SPECIMEN:  Source of Specimen:  terminal ileum and cecum with possible perforated mass  DISPOSITION OF SPECIMEN:  PATHOLOGY  COUNTS:  YES  TOURNIQUET:  * No tourniquets in log *  DICTATION: .Dragon Dictation   After informed consent was obtained the patient was brought to the operating room and placed in the supine position on the operating table.  After adequate induction of general anesthesia the patient's abdomen was prepped with ChloraPrep, allowed to dry, and draped in usual sterile manner.  An appropriate timeout was performed.  A midline incision was made with a 10 blade knife.  The incision was carried through the skin and subcutaneous tissue sharply with the electrocautery until the linea alba was identified.  The linea alba was incised with the electrocautery.  The preperitoneal space was probed bluntly with a hemostat until the peritoneum was opened and access was gained to the abdominal cavity.  The rest of the incision was opened under direct vision.  There was a large ovarian cyst taken out most of the lower abdominal cavity that made it difficult to see very well.  The omentum was adherent to the anterior abdominal wall and this was taken down sharply with the LigaSure.  I was then able to reflect the colon and omentum up and find the ligament of Treitz.  The small bowel was then run to the cecum.  At the  cecum the small bowel was severely dilated and the cecum appeared to be tethered in the retroperitoneum by what looked like tumor.  I was able to mobilize the cecum by incising some of the retroperitoneal attachment along the white line of Toldt.  I could not remove all the tumor and could not tell where the tumor was arising from.  The cecum appeared to be perforated at the site of the tumor.  Once the cecum was up in the air I was then able to divide the cecum distal to the area of perforation with a single firing of a GIA-75 stapler.  I was also able to divide the terminal ileum at a site that looked relatively healthy also via single firing of a GIA-75 stapler.  The mesentery to the terminal ileum and cecum was then taken down sharply with the LigaSure.  The segment that was removed was then sent to pathology for further evaluation.  The abdomen was then irrigated with copious amounts of saline.  The patient was showing ongoing signs of sepsis with hypotension and was requiring increasing amounts of vasopressors.  Because of this we decided at this point now that the site of the perforation was controlled to leave her stapled off and placed an abdominal vacuum dressing and take her back to the ICU for further resuscitation.  The abdominal vacuum dressing was applied and there was a good seal.  She will be taken back to the ICU intubated  in critical condition.  PLAN OF CARE: Admit to inpatient   PATIENT DISPOSITION:  ICU - intubated and critically ill.   Delay start of Pharmacological VTE agent (>24hrs) due to surgical blood loss or risk of bleeding: no

## 2020-06-15 NOTE — Progress Notes (Signed)
PHARMACY NOTE:  ANTIMICROBIAL RENAL DOSAGE ADJUSTMENT  Current antimicrobial regimen includes a mismatch between antimicrobial dosage and estimated renal function.  As per policy approved by the Pharmacy & Therapeutics and Medical Executive Committees, the antimicrobial dosage will be adjusted accordingly.  Current antimicrobial dosage:  Cefepime 2g IV every 12 hours  Indication: Intra-abdominal empiric coverage with Flagyl  Renal Function:  Estimated Creatinine Clearance: 28.3 mL/min (A) (by C-G formula based on SCr of 2.77 mg/dL (H)). []      On intermittent HD, scheduled: []      On CRRT    Antimicrobial dosage has been changed to:  Cefepime 2g IV every 24 hours  Additional comments: Will follow-up renal plans post operatively   Thank you for allowing pharmacy to be a part of this patient's care.  Sloan Leiter, PharmD, BCPS, BCCCP Clinical Pharmacist Please refer to [facility] for [place] numbers 05/25/2020 11:12 AM

## 2020-06-15 NOTE — Progress Notes (Signed)
Dr. Rosendo Gros made aware of high output of blood in open abdomen wound vac.  Will continue to monitor.

## 2020-06-15 NOTE — Progress Notes (Signed)
eLink Physician-Brief Progress Note Patient Name: Kari [name] DOB: 1961-05-21 MRN: [id number]   Date of Service  06/10/2020  HPI/Events of Note  Lactic Acid = 4.4. LVEF = 50-55%  eICU Interventions  Plan: 1. Bolus with 0.9 NaCl 1 liter IV over 1 hour now.  2. Repeat Lactic Acid at 10 AM.      Intervention Category Major Interventions: Acid-Base disturbance - evaluation and management  Fabiha Rougeau [place] 06/17/2020, 6:49 AM

## 2020-06-15 NOTE — Progress Notes (Signed)
Initial Nutrition Assessment  RD working remotely.  DOCUMENTATION CODES:   Obesity unspecified  INTERVENTION:   - RD will follow for return of bowel function and ability to start tube feeds after surgery vs need for TPN  Once nutrition is started either enterally or via TPN, monitor magnesium, potassium, and phosphorus daily for at least 3 days, MD to replete as needed, as pt is at risk for refeeding syndrome given reports of poor PO intake for several weeks PTA.  NUTRITION DIAGNOSIS:   Inadequate oral intake related to altered GI function as evidenced by NPO status.  GOAL:   Provide needs based on ASPEN/SCCM guidelines  MONITOR:   Vent status, Labs, Weight trends, Skin, I & O's  REASON FOR ASSESSMENT:   Ventilator    ASSESSMENT:   59 year old female who presented on 6/18 with complaints of weakness. PMH of asthma, CHF, HLD, HTN, sarcoidosis, MI, CKD stage III, chronic venous stasis ulcers to bilateral heels, VT arrest in 2018 s/p ICD placement, gout, umbilical hernia. Admitted with hypovolemic shock.   6/22 - back on pressors, NGT placed for decompression, CT scan showing SBO and persistent large pelvic cyst, incarcerated umbilical hernia reduced by Surgery 6/23 - intubated  Per H&P, pt reported not drinking or eating her normal amounts over the last 2 weeks PTA. Pt ate 0-5% of meals prior to NPO status on 6/22.  Plan is for exploratory laparotomy today. Per notes, pt will likely have open abdomen after surgery. RD will follow along for return of bowel function and ability to start tube feeds vs need for TPN after surgery.  Once nutrition is started (either TPN or tube feeds), pt is at risk for refeeding syndrome.  NG tube remains in place to low intermittent suction.  Weight up 7 lbs since admission. Will use 107.2 kg as EDW (BMI 37.01). Per RN edema assessment, pt with mild pitting generalized edema dn mild pitting edema to BUE and BLE.  Patient is currently  intubated on ventilator support MV: 9.4 L/min Temp (24hrs), Avg:97.7 F (36.5 C), Min:94.8 F (34.9 C), Max:100.8 F (38.2 C) BP (a-line): 124/65 MAP (a-line): 85  Drips: Levophed: 20.6 ml/hr Phenylephrine: 24 ml/hr Sodium bicarb: 150 ml/hr LR Fenanyl  Medications reviewed and include: colace, protonix, IV abx  Labs reviewed: sodium 149, potassium 3.1, lactic acid 5.5 CBG's: 59-115 x 24 hours  UOP: 80 ml x 24 hours NGT output: 950 ml x 24 hours I/O's: +10.7 L since admit  NUTRITION - FOCUSED PHYSICAL EXAM:  Unable to complete at this time. RD working remotely.  Diet Order:   Diet Order            Diet NPO time specified  Diet effective now                 EDUCATION NEEDS:   No education needs have been identified at this time  Skin:  Skin Assessment: Skin Integrity Issues: Other: venous stasis ulcers to BLE  Last BM:  06/14/20  Height:   Ht Readings from Last 1 Encounters:  06/11/20 5\' 7"  (1.702 m)    Weight:   Wt Readings from Last 1 Encounters:  06/11/20 110.3 kg    Ideal Body Weight:  61.4 kg  BMI:  Body mass index is 38.09 kg/m.  Estimated Nutritional Needs:   Kcal:  2202-5427  Protein:  123-143 grams  Fluid:  >/= 1.5 L    Gaynell Face, MS, RD, LDN Inpatient Clinical Dietitian Pager: [phone] Weekend/After Hours:  [phone] ° °

## 2020-06-15 NOTE — Consult Note (Signed)
Cardiology Consultation:   Patient ID: Kari [name] MRN: [id number]; DOB: 1961/11/03  Admit date: 05/29/2020 Date of Consult: 06/17/2020  Primary Care Provider: Rutherford Guys, MD [facility] Cardiologist: Dorris Carnes, MD  [facility] Electrophysiologist:  Will Meredith Leeds, MD    Patient Profile:   Kari [name] is a 59 y.o. 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  History of Present Illness:   Ms. Drummonds is a 59 year old woman with history significant for NICM (now near-normalized; TTE 06-11-20 showed EF 50-55%), VF arrest on 06-26-17 status post ICD placement (07-05-17), and suspected sarcoidosis presenting with weakness and hypotension unresponsive to  fluids with symptoms of shock. She underwent exploratory laparotomy earlier today that revealed necrotic bowel; she had partial bowel resection and abdominal wound was left open w/ wound vac. She has been hypotensive, requiring pressor support. When I saw her this evening her SBP was ~70s. She has been having issues w/ hypokalemia; at ~2:15pm earlier today, her K was down to 2.9. It appears that has been supplemented, and repeat at around 7pm showed K up to 3.8. Magnesium level most recent was 2.7. RN's tell me that pt has been having increasingly frequent PVCs through the afternoon and early evening today. She has some couplets/triplets, but no sustained runs of VT. She does have ICD in place as above. She has not been shocked or had any obvious therapy from the device.  She is currently intubated/sedated.  Past Medical History:  Diagnosis Date  . Arthritis   . Asthma   . CHF (congestive heart failure) ([place]) 03/12/2014   a. EF 40-45% by echo in 06/2017 with normal cors by cath. ICD placed following VT arrest  .  Depression   . Headache(784.0)   . History of blood transfusion   . Hyperlipidemia   . Hypertension   . Lung nodules   . Myocardial infarction ([place]) 06/26/2014  . Renal disorder    followed by [facility]  . Sarcoidosis   . Ulcer    recurring, from chronic venous insufficiency    Past Surgical History:  Procedure Laterality Date  . cataract surgery Left 07-2013  . I & D EXTREMITY Bilateral 12/31/2018   Procedure: DEBRIDEMENT BILATERAL LEGS, APPLY VAC X 2;  Surgeon: [name], MD;  Location: [place];  Service: Orthopedics;  Laterality: Bilateral;  . I & D EXTREMITY Bilateral 01/02/2019   Procedure: REPEAT DEBRIDEMENT BILATERAL LEGS, APPLY VAC X 2;  Surgeon: [name], MD;  Location: [place];  Service: Orthopedics;  Laterality: Bilateral;  . I & D EXTREMITY Bilateral 07/15/2019   Procedure: IRRIGATION AND DEBRIDEMENT VENOUS STASIS INSUFFICIENCY ULCERATIONS BILATERAL LOWER EXTREMITIES;  Surgeon: [name], MD;  Location: [place];  Service: Orthopedics;  Laterality: Bilateral;  . I & D EXTREMITY Bilateral 08/23/2019   Procedure: DEBRIDEMENT EXTREMITY;  Surgeon: [name], MD;  Location: [place];  Service: Orthopedics;  Laterality: Bilateral;  . ICD IMPLANT N/A 07/05/2017   Procedure: ICD Implant;  Surgeon: Constance Haw, MD;  Location: [place] [facility];  Service: Cardiovascular;  Laterality: N/A;  . LEFT HEART CATH AND CORONARY ANGIOGRAPHY N/A 07/03/2017   Procedure: Left Heart Cath and Coronary Angiography;  Surgeon: Belva Crome, MD;  Location: [place] [facility];  Service: Cardiovascular;  Laterality: N/A;  . SKIN SPLIT GRAFT Bilateral 01/07/2019   Procedure: SPLIT THICKNESS SKIN GRAFT BILATERAL LEGS, APPLY VAC;  Surgeon: [name], MD;  Location: [place];  Service: Orthopedics;  Laterality: Bilateral;  . SKIN SPLIT GRAFT Bilateral 07/17/2019   Procedure: REPEAT IRRIGATION AND DEBRIDEMENT BILATERAL LOWER EXTREMITIES, SKIN GRAFT;  Surgeon: [name], MD;   Location: [place];  Service: Orthopedics;  Laterality: Bilateral;  . SKIN SPLIT GRAFT Bilateral 08/26/2019   Procedure: SKIN GRAFT SPLIT THICKNESS BILATERAL LEGS, APPLY WOUND VAC;  Surgeon: [name], MD;  Location: [place];  Service: Orthopedics;  Laterality: Bilateral;     Home Medications:  Prior to Admission medications   Medication Sig Start Date End Date Taking? Authorizing Provider  acetaminophen (TYLENOL) 325 MG tablet Take 1-2 tablets (325-650 mg total) by mouth every 4 (four) hours as needed for mild pain. 07/22/19  Yes Love, Ivan Anchors, PA-C  albuterol (PROVENTIL HFA;VENTOLIN HFA) 108 (90 Base) MCG/ACT inhaler Inhale 2 puffs into the lungs every 6 (six) hours as needed for shortness of breath. 02/02/19  Yes Medina-Vargas, Monina C, NP  allopurinol (ZYLOPRIM) 100 MG tablet TAKE 2 TABLETS (200 MG TOTAL) BY MOUTH DAILY. Patient taking differently: Take 100 mg by mouth in the morning and at bedtime.  05/05/20  Yes Rutherford Guys, MD  Ascorbic Acid (VITAMIN C) 1000 MG tablet Take 1,000 mg by mouth daily.   Yes [provider]  aspirin EC 81 MG tablet Take 81 mg by mouth daily.   Yes [provider]  carvedilol (COREG) 6.25 MG tablet Take 1 tablet (6.25 mg total) by mouth 2 (two) times daily. 02/02/19  Yes Medina-Vargas, Monina C, NP  ferrous sulfate 325 (65 FE) MG EC tablet Take 325 mg by mouth 3 (three) times daily with meals.   Yes [provider]  furosemide (LASIX) 40 MG tablet Take 40 mg by mouth daily.   Yes [provider]  HYDROcodone-acetaminophen (NORCO/VICODIN) 5-325 MG tablet Take 1 tablet by mouth every 6 (six) hours as needed for moderate pain. 05/05/20  Yes Persons, Bevely Palmer, PA  senna-docusate (SENOKOT-S) 8.6-50 MG tablet Take 2 tablets by mouth 2 (two) times daily. 09/03/19  Yes Love, Ivan Anchors, PA-C  ondansetron (ZOFRAN ODT) 4 MG disintegrating tablet Take 1 tablet (4 mg total) by mouth every 8 (eight) hours as needed for nausea or  vomiting. Patient not taking: Reported on 05/29/2020 02/08/20   Long, Wonda Olds, MD  oxyCODONE-acetaminophen (PERCOCET) 5-325 MG tablet Take 1 tablet by mouth every 4 (four) hours as needed for severe pain. Patient not taking: Reported on 05/25/2020 04/14/20 04/14/21  Persons, Bevely Palmer, PA  polyethylene glycol (MIRALAX / GLYCOLAX) 17 g packet Take 17 g by mouth daily as needed. Patient not taking: Reported on 06/20/2020 09/03/19   Bary Leriche, PA-C    Inpatient Medications: Scheduled Meds: . [START ON 06/16/2020] allopurinol  50 mg Per Tube QODAY  . aspirin  81 mg Per Tube Daily  . chlorhexidine gluconate (MEDLINE KIT)  15 mL Mouth Rinse BID  . chlorhexidine gluconate (MEDLINE KIT)  15 mL Mouth Rinse BID  . Chlorhexidine Gluconate Cloth  6 each Topical Daily  . docusate  100 mg Oral BID  . mouth rinse  15 mL Mouth Rinse 10 times per day  . pantoprazole (PROTONIX) IV  40 mg Intravenous Q24H   Continuous Infusions: . sodium chloride Stopped (06/13/2020 1446)  . [START ON 06/16/2020] ceFEPime (MAXIPIME)  IV    . fentaNYL infusion INTRAVENOUS    . metronidazole 500 mg (06/19/2020 1731)  . norepinephrine (LEVOPHED) Adult infusion 24 mcg/min (06/09/2020 2036)  . phenylephrine (NEO-SYNEPHRINE) Adult infusion 200 mcg/min (06/20/2020 2053)  . sodium bicarbonate 150 mEq in dextrose 5% 1000 mL 150 mL/hr at 06/13/2020 1900   PRN Meds: acetaminophen, albuterol, bisacodyl, docusate, fentaNYL, fentaNYL (SUBLIMAZE) injection, fentaNYL (SUBLIMAZE) injection, ondansetron (ZOFRAN) IV  Allergies:    Allergies  Allergen Reactions  . Tramadol Other (See Comments)    hallucination    Social History:   Social History   Socioeconomic History  . Marital status: Single    Spouse name: Not on file  . Number of children: 1  . Years of education: Not on file  . Highest education level: Not on file  Occupational History  . Not on file  Tobacco Use  . Smoking status: Never Smoker  . Smokeless tobacco: Never Used   Vaping Use  . Vaping Use: Never used  Substance and Sexual Activity  . Alcohol use: No  . Drug use: No  . Sexual activity: Not Currently  Other Topics Concern  . Not on file  Social History Narrative   Lives alone.   Social Determinants of Health   Financial Resource Strain:   . Difficulty of Paying Living Expenses:   Food Insecurity:   . Worried About Charity fundraiser in the Last Year:   . Arboriculturist in the Last Year:   Transportation Needs:   . Film/video editor (Medical):   Marland Kitchen Lack of Transportation (Non-Medical):   Physical Activity:   . Days of Exercise per Week:   . Minutes of Exercise per Session:   Stress:   . Feeling of Stress :   Social Connections:   . Frequency of Communication with Friends and Family:   . Frequency of Social Gatherings with Friends and Family:   . Attends Religious Services:   . Active Member of Clubs or Organizations:   . Attends Archivist Meetings:   Marland Kitchen Marital Status:   Intimate Partner Violence:   . Fear of Current or Ex-Partner:   . Emotionally Abused:   Marland Kitchen Physically Abused:   . Sexually Abused:     Family History:    Family History  Problem Relation Age of Onset  . Heart failure Mother   . Diabetes Mellitus II Mother   . Hypertension Mother   . Cancer Mother        unknown type  . Heart disease Father   . Stroke Father   . Diabetes Mellitus II Father      ROS:  Please see the history of present illness.   ROS not obtained (pt intubated/sedated)  Physical Exam/Data:   Vitals:   06/06/2020 2055 05/26/2020 2100 06/14/2020 2115 05/24/2020 2130  BP:  (!) 87/64 98/64 (!) 84/65  Pulse:  (!) 47  (!) 26  Resp: 20 18 (!) 22 20  Temp: (!) 97.2 F (36.2 C)     TempSrc: Temporal     SpO2:  100%  100%  Weight:      Height:        Intake/Output Summary (Last 24 hours) at 06/14/2020 2205 Last data filed at 05/29/2020 2100 Gross per 24 hour  Intake 8699.78 ml  Output 2555 ml  Net 6144.78 ml   Last 3  Weights 06/11/2020 06/04/2020 06/02/2020  Weight (lbs) 243 lb 2.7 oz 236 lb 5.3 oz 225 lb  Weight (kg) 110.3 kg 107.2 kg 102.059 kg     Body mass index is 38.09 kg/m.  General:  Pt intubated, sedated HEENT: normal. ETT in place Lymph: no adenopathy Neck: no JVD Endocrine:  No thryomegaly Vascular: No carotid bruits; DP pulses 2+ bilaterally   Cardiac:  normal S1, S2, frequent ectopy; RRR; no murmur  Lungs:  CTA anteriorly Abd: open surgical wound Ext: no edema Musculoskeletal:  No deformities Skin: warm and dry  Neuro:  Pt sedated.   EKG:  The EKG was personally reviewed and demonstrates:  NSR with frequent multifocal PVCs, occasional couplets/triplets Telemetry:  Telemetry was personally reviewed and demonstrates:  ST (HR low 100s) with frequent PVCs, occas couplet/triplet  Relevant CV Studies: 06-11-20 TTE 1. Left ventricular ejection fraction, by estimation, is 50 to 55%. The  left ventricle has low normal function. The left ventricular internal  cavity size was mildly dilated. There is mild left ventricular  hypertrophy. Left ventricular diastolic  parameters are indeterminate.  2. Right ventricular systolic function is normal. The right ventricular  size is mildly enlarged.  3. The mitral valve is normal in structure. Trivial mitral valve  regurgitation.  4. The aortic valve is tricuspid. Aortic valve regurgitation is trivial.  No aortic stenosis is present.   LHC 07/03/17  Left dominant coronary anatomy.  Normal coronary arteries.  Mild to moderate global left ventricular hypo-kinesis with estimated EF 35-40%. LV end-diastolic pressure is up and normal. Combined systolic and diastolic heart failure, chronic   Laboratory Data:  High Sensitivity Troponin:   Recent Labs  Lab 06/11/20 0724 06/11/20 0854 06/14/20 1547 06/14/20 1812  TROPONINIHS '8 7 9 9     ' Chemistry Recent Labs  Lab 06/13/2020 0519 06/08/2020 0938 06/01/2020 1100 06/22/2020 1100 06/05/2020 1418  05/30/2020 1845 06/21/2020 1857  NA 148*   < > 149*   < > 150* 148* 149*  K 3.6   < > 3.1*   < > 2.9* 3.6 3.8  CL 115*  --  113*  --   --   --  113*  CO2 17*  --  17*  --   --   --  16*  GLUCOSE 130*  --  180*  --   --   --  203*  BUN 85*  --  79*  --   --   --  79*  CREATININE 2.77*  --  2.58*  --   --   --  2.71*  CALCIUM 7.2*  --  6.9*  --   --   --  7.2*  GFRNONAA 18*  --  20*  --   --   --  19*  GFRAA 21*  --  23*  --   --   --  22*  ANIONGAP 16*  --  19*  --   --   --  20*   < > = values in this interval not displayed.    Recent Labs  Lab 06/14/20 1049 06/04/2020 0519 06/01/2020 1857  PROT 4.7* 4.2* 4.0*  ALBUMIN 1.6* 1.4* 1.7*  AST 10* 21 20  ALT '20 22 18  ' ALKPHOS 56 54 48  BILITOT 1.4* 1.2 1.7*   Hematology Recent Labs  Lab 06/14/20 1049 06/14/20 2029 06/21/2020 0519 06/04/2020 0938 05/29/2020 1418 06/05/2020 1845 06/21/2020 1857  WBC 13.6*  --  17.2*  --   --   --  11.3*  RBC 3.71*  --  3.57*  --   --   --  4.72  HGB 11.4*   < > 11.0*   < > 9.2* 14.6 14.3  HCT 34.8*   < > 32.8*   < > 27.0* 43.0 41.6  MCV 93.8  --  91.9  --   --   --  88.1  MCH 30.7  --  30.8  --   --   --  30.3  MCHC 32.8  --  33.5  --   --   --  34.4  RDW 17.7*  --  17.5*  --   --   --  16.1*  PLT 191  --  162  --   --   --  91*   < > = values in this interval not displayed.   BNP Recent Labs  Lab 06/12/20 1957  BNP 421.1*    DDimer No results for input(s): DDIMER in the last 168 hours.   Radiology/Studies:  CT ABDOMEN PELVIS WO CONTRAST  Result Date: 06/14/2020 CLINICAL DATA:  Abdominal pain and distension EXAM: CT ABDOMEN AND PELVIS WITHOUT CONTRAST TECHNIQUE: Multidetector CT imaging of the abdomen and pelvis was performed following the standard protocol without IV contrast. COMPARISON:  02/08/2020 FINDINGS: Lower chest: Minimal basilar scarring is noted. Gastric catheter is noted coiled in the stomach. Hepatobiliary: Liver demonstrates diffuse geographic decreased attenuation consistent with  fatty infiltration. Gallbladder is well distended with hyperdense material as well as a few small gallstones. No wall thickening or pericholecystic fluid is noted. Pancreas: Unremarkable. No pancreatic ductal dilatation or surrounding inflammatory changes. Spleen: Normal in size without focal abnormality. Adrenals/Urinary Tract: Adrenal glands are within normal limits. Kidneys demonstrate no renal calculi or urinary tract obstructive changes. The bladder is partially distended. Stomach/Bowel: Colon is decompressed. The appendix is not well visualized. No inflammatory changes to suggest appendicitis are noted. Significant small bowel dilatation is noted throughout almost the entire small bowel with a transition zone in the right mid abdomen. No definitive mass lesion is seen to correspond with the transition zone although it appears somewhat similar to that seen on prior exam. Gastric catheter extends into the stomach. Vascular/Lymphatic: No significant vascular findings are present. No enlarged abdominal or pelvic lymph nodes. Reproductive: Uterus is within normal limits. There is again noted a large cystic mass lesion arising likely from the left ovary measuring approximately 21.7 cm in greatest transverse dimension. This is roughly similar to that seen on the prior exam. Other: Mild free fluid is noted slightly increased from the prior exam. Mild changes of anasarca are noted. Fat containing umbilical hernia is again seen and stable. Musculoskeletal: Degenerative changes of lumbar spine are noted. IMPRESSION: Increase in the degree of small-bowel obstruction with a transition zone in the right mid abdomen although no definitive mass lesion is seen. Stable large pelvic cystic mass likely ovarian in etiology. Cholelithiasis. Fatty infiltration of the liver. Electronically Signed   By: Inez Catalina M.D.   On: 06/14/2020 13:22   DG Abd 1 View  Result Date: 06/14/2020 CLINICAL DATA:  59 year old female with small  bowel obstruction. EXAM: ABDOMEN - 1 VIEW COMPARISON:  CT abdomen pelvis dated 06/14/2020. FINDINGS: Enteric tube with tip in the mid to distal stomach. Mildly dilated small bowel loops in the upper abdomen measuring 4.4 cm in diameter. IMPRESSION: Enteric tube with tip in the mid to distal stomach. Electronically Signed   By: Anner Crete M.D.   On: 06/14/2020 20:03   DG Abd 1 View  Result Date: 06/14/2020 CLINICAL DATA:  Abdominal distention.  Vomiting.  EXAM: ABDOMEN - 1 VIEW COMPARISON:  CT 02/08/2020. FINDINGS: Several loops of slightly prominent small bowel noted. No colonic distention. Stool in the colon. No free air visualized. No pathologic intra-abdominal calcifications identified. Increased density noted in the pelvis possibly related to previously identified large cystic pelvic mass. No acute bony abnormality. IMPRESSION: 1. Several loops of slightly prominent small bowel noted. Follow-up exam to demonstrate resolution suggested. 2. Increased density noted the pelvis possibly related to previously identified large cystic pelvic mass. Electronically Signed   By: Marcello Moores  Register   On: 06/14/2020 05:22   DG CHEST PORT 1 VIEW  Result Date: 05/25/2020 CLINICAL DATA:  Endotracheal tube. EXAM: PORTABLE CHEST 1 VIEW COMPARISON:  June 14, 2020. FINDINGS: Stable cardiomediastinal silhouette. Endotracheal nasogastric tubes are unchanged in position. Left-sided pacemaker is unchanged in position. Right internal jugular catheter is noted, but tip has been withdrawn to the SVC. Lungs are clear. No pneumothorax or pleural effusion is noted. Bony thorax is unremarkable. IMPRESSION: No acute cardiopulmonary abnormality seen. Stable support apparatus. Right internal jugular catheter tip has been withdrawn to the SVC. Electronically Signed   By: Marijo Conception M.D.   On: 06/04/2020 09:11   DG CHEST PORT 1 VIEW  Result Date: 06/14/2020 CLINICAL DATA:  New nasogastric tube and central line EXAM: PORTABLE  CHEST 1 VIEW COMPARISON:  06/07/2020 FINDINGS: Nasogastric tube is looped in the stomach, with the tip at the gastric body to antrum. The upper chest is minimally excluded. Pacer/AICD device. Right internal jugular line tip at mid right atrium. Normal heart size for level of inspiration. No pleural effusion or pneumothorax. No congestive failure. No lobar consolidation. Low lung volumes with resultant pulmonary interstitial prominence. IMPRESSION: Nasogastric tube appropriately positioned. Right-sided internal jugular line tip at mid right atrium. No pneumothorax or other acute complication. Low lung volumes, without acute disease. Electronically Signed   By: Abigail Miyamoto M.D.   On: 06/14/2020 11:07          Assessment and Plan:   1. Frequent ventricular ectopy: PVC multifocal, exacerbated by hypokalemia. Keep K>4.0, Mag>2.0 if possible. Pt is septic, with SBP in 70s, on pressors; unable to give BB to help suppress ectopy at this time. No therapy has been administered by the device, no sustained VT. If VT runs become longer or ectopy does not quiet down once electrolytes have been supplemented fully, can consider adding amiodarone IV gtt w/ 169m bolus. 2. NICM: by history. TTE on 06-11-20 showed EF 50-55%. Unable to give any medical HF therapy given current sepsis and hypotension. 3. Sarcoidosis: h/o VF arrest, s/p ICD. mgmt of systemic sarcoid as per primary team. 4. CKD w/ worsening renal failure: supplement lytes cautiously. Mgmt as per primary team/CCM/nephrology      For questions or updates, please contact CCampPlease consult [email or web address] for contact info under    Signed, SRudean Curt MD, FNei Ambulatory [facility] 05/24/2020 10:05 PM

## 2020-06-15 NOTE — Progress Notes (Signed)
Dr. Lucile Shutters made aware of increased ectopy accompanied by intermittent poor perfusion noted in a-line & plethysmography despite pressors. Pt does have ICD.  Will continue to monitor.

## 2020-06-15 NOTE — Procedures (Signed)
Intubation Procedure Note Kari [name] [id number] 29-Jan-1961  Procedure: Intubation Indications: Airway protection and maintenance  Procedure Details Consent: Risks of procedure as well as the alternatives and risks of each were explained to the (patient/caregiver).  Consent for procedure obtained. Time Out: Verified patient identification, verified procedure, site/side was marked, verified correct patient position, special equipment/implants available, medications/allergies/relevent history reviewed, required imaging and test results available.  Performed  Maximum sterile technique was used including gloves, hand hygiene and mask.  MAC and 3 glidescope S3  Versed 37m Rocuronium 1160m1 attempt, grade 1 view No complications. No hypotension or hypoxia.    Evaluation Hemodynamic Status: BP stable throughout; O2 sats: stable throughout Patient's Current Condition: stable Complications: No apparent complications Patient did tolerate procedure well. Chest X-ray ordered to verify placement.  CXR: pending.   LaJulian Hy/23/2021

## 2020-06-15 NOTE — Transfer of Care (Signed)
Immediate Anesthesia Transfer of Care Note  Patient: Kari [name]  Procedure(s) Performed: EXPLORATORY LAPAROTOMY (N/A Abdomen) SMALL BOWEL RESECTION (N/A Abdomen)  Patient Location: PACU and ICU  Anesthesia Type:General  Level of Consciousness: sedated and Patient remains intubated per anesthesia plan  Airway & Oxygen Therapy: Patient remains intubated per anesthesia plan and Patient placed on Ventilator (see vital sign flow sheet for setting)  Post-op Assessment: Report given to RN and Post -op Vital signs reviewed and stable  Post vital signs: Reviewed and stable  Last Vitals:  Vitals Value Taken Time  BP    Temp    Pulse    Resp    SpO2 100 % 06/16/2020 1534    Last Pain:  Vitals:   05/24/2020 1127  TempSrc: Oral  PainSc:       Patients Stated Pain Goal:  (nausea) (00/63/49 4944)  Complications: No complications documented.

## 2020-06-15 NOTE — Progress Notes (Signed)
Called by [facility] RN.  Pt with large amount of SS output from abd vac.   It appears to be more blood tinged serous fluid.  Pts h/h stable. Likely ascities from neoplastic reaction and recent surgery. Pt receiving PRBC per CCM.  Con't to observe for now.

## 2020-06-15 NOTE — Progress Notes (Signed)
eLink Physician-Brief Progress Note Patient Name: Kari [name] DOB: 06-17-1961 MRN: [id number]   Date of Service  06/07/2020  HPI/Events of Note  Frequent ectopy and short runs of V-tach, Pt has an AICD, also lactate  Up to 6.8, large amount of sero-sanguinous output from wound vac.  eICU Interventions  Cardiology (Dr. Hassell Done) consulted regarding arrhythmia-she will see patient, surgery was called regarding high wound vac output, will check BMET and Magnesium,  Will give a fluid bolus and trend lactate.         [name] 06/16/2020, 10:05 PM

## 2020-06-15 NOTE — Progress Notes (Signed)
CRITICAL VALUE ALERT  Critical Value:  LA 4.4  Date & Time Notied:  06/01/2020  Provider Notified: Dr. Emmit Alexanders via Wyn Forster, RN  Orders Received/Actions taken: awaiting new orders

## 2020-06-15 NOTE — Anesthesia Preprocedure Evaluation (Addendum)
Anesthesia Evaluation  Patient identified by MRN, date of birth, ID band Patient awake    Reviewed: Allergy & Precautions, NPO status , Patient's Chart, lab work & pertinent test results  Airway Mallampati: Intubated       Dental   Pulmonary asthma ,       + intubated    Cardiovascular hypertension, + Past MI and +CHF   Rhythm:Regular Rate:Normal  History noted. CG   Neuro/Psych  Headaches, PSYCHIATRIC DISORDERS Depression    GI/Hepatic negative GI ROS, Neg liver ROS,   Endo/Other    Renal/GU Renal disease     Musculoskeletal   Abdominal   Peds  Hematology  (+) anemia ,   Anesthesia Other Findings   Reproductive/Obstetrics                             Anesthesia Physical Anesthesia Plan  ASA: IV  Anesthesia Plan: General   Post-op Pain Management:    Induction: Intravenous  PONV Risk Score and Plan: Treatment may vary due to age or medical condition  Airway Management Planned: Oral ETT  Additional Equipment:   Intra-op Plan:   Post-operative Plan: Post-operative intubation/ventilation  Informed Consent: I have reviewed the patients History and Physical, chart, labs and discussed the procedure including the risks, benefits and alternatives for the proposed anesthesia with the patient or authorized representative who has indicated his/her understanding and acceptance.     Dental advisory given  Plan Discussed with: CRNA and Anesthesiologist  Anesthesia Plan Comments:         Anesthesia Quick Evaluation

## 2020-06-15 NOTE — Progress Notes (Signed)
[facility] Progress Note     Subjective: Patient less responsive this AM. Has become more distended/full in upper abdomen overnight. Not making much urine and what is there appears very concentrated. Still no etiology for sepsis identified.   Objective: Vital signs in last 24 hours: Temp:  [94.8 F (34.9 C)-100.8 F (38.2 C)] 100.8 F (38.2 C) (06/23 0600) Pulse Rate:  [25-88] 79 (06/23 0700) Resp:  [16-38] 29 (06/23 0700) BP: (41-107)/(20-77) 99/66 (06/23 0700) SpO2:  [92 %-100 %] 100 % (06/23 0700) Last BM Date: 06/14/20  Intake/Output from previous day: 06/22 0701 - 06/23 0700 In: 5187.6 [I.V.:3487.1; NG/GT:500; IV Piggyback:1200.5] Out: 805 [Urine:80; Emesis/NG output:725] Intake/Output this shift: No intake/output data recorded.  PE: General: lethargic, obese, black female who is laying in bed in NAD HEENT: head is normocephalic, atraumatic.  Sclera are noninjected.  PERRL.  Ears and nose without any masses or lesions.  Nose with NGT in place with dark red-brown output. Mouth is pink and dry Heart: regular, rate, and rhythm.  Normal s1,s2. No obvious murmurs, gallops, or rubs noted.  Palpable radial and pedal pulses bilaterally Lungs: CTAB, no wheezes, rhonchi, or rales noted.  Respiratory effort nonlabored Abd: soft, very tender over her reduced umbilical hernia, very small 1cm defect. Otherwise not really tender anywhere else.  No rebounding, guarding, or peritoneal signs. Fullness/distention in upper abdomen MS: all 4 extremities are symmetrical with no cyanosis, clubbing, or edema. Skin: warm and dry with no masses, lesions, or rashes except BLE ulcers that are currently covered.  Pictures in the chart noted Psych: lethargic.  Decreased mentation, no longer answering any questions   Lab Results:  Recent Labs    06/14/20 1049 06/14/20 1049 06/14/20 2029 06/22/2020 0519  WBC 13.6*  --   --  17.2*  HGB 11.4*   < > 11.9* 11.0*  HCT 34.8*   < > 35.0*  32.8*  PLT 191  --   --  162   < > = values in this interval not displayed.   BMET Recent Labs    06/14/20 1547 06/14/20 1547 06/14/20 2029 05/31/2020 0519  NA 148*   < > 148* 148*  K 3.8   < > 3.8 3.6  CL 118*  --   --  115*  CO2 15*  --   --  17*  GLUCOSE 83  --   --  130*  BUN 84*  --   --  85*  CREATININE 2.49*  --   --  2.77*  CALCIUM 7.6*  --   --  7.2*   < > = values in this interval not displayed.   PT/INR Recent Labs    06/14/20 1812  LABPROT 19.2*  INR 1.7*   CMP     Component Value Date/Time   NA 148 (H) 06/06/2020 0519   NA 140 12/31/2019 1044   K 3.6 06/07/2020 0519   CL 115 (H) 06/11/2020 0519   CO2 17 (L) 06/17/2020 0519   GLUCOSE 130 (H) 05/28/2020 0519   BUN 85 (H) 06/07/2020 0519   BUN 18 12/31/2019 1044   CREATININE 2.77 (H) 06/12/2020 0519   CREATININE 0.83 03/31/2014 1010   CALCIUM 7.2 (L) 06/05/2020 0519   PROT 4.2 (L) 06/06/2020 0519   PROT 7.5 12/31/2019 1044   ALBUMIN 1.4 (L) 06/07/2020 0519   ALBUMIN 3.5 (L) 12/31/2019 1044   AST 21 06/03/2020 0519   ALT 22 06/09/2020 0519   ALKPHOS 54 06/07/2020 0519  BILITOT 1.2 06/03/2020 0519   BILITOT 0.7 12/31/2019 1044   GFRNONAA 18 (L) 06/07/2020 0519   GFRAA 21 (L) 06/17/2020 0519   Lipase     Component Value Date/Time   LIPASE 162 (H) 06/14/2020 1547       Studies/Results: CT ABDOMEN PELVIS WO CONTRAST  Result Date: 06/14/2020 CLINICAL DATA:  Abdominal pain and distension EXAM: CT ABDOMEN AND PELVIS WITHOUT CONTRAST TECHNIQUE: Multidetector CT imaging of the abdomen and pelvis was performed following the standard protocol without IV contrast. COMPARISON:  02/08/2020 FINDINGS: Lower chest: Minimal basilar scarring is noted. Gastric catheter is noted coiled in the stomach. Hepatobiliary: Liver demonstrates diffuse geographic decreased attenuation consistent with fatty infiltration. Gallbladder is well distended with hyperdense material as well as a few small gallstones. No wall  thickening or pericholecystic fluid is noted. Pancreas: Unremarkable. No pancreatic ductal dilatation or surrounding inflammatory changes. Spleen: Normal in size without focal abnormality. Adrenals/Urinary Tract: Adrenal glands are within normal limits. Kidneys demonstrate no renal calculi or urinary tract obstructive changes. The bladder is partially distended. Stomach/Bowel: Colon is decompressed. The appendix is not well visualized. No inflammatory changes to suggest appendicitis are noted. Significant small bowel dilatation is noted throughout almost the entire small bowel with a transition zone in the right mid abdomen. No definitive mass lesion is seen to correspond with the transition zone although it appears somewhat similar to that seen on prior exam. Gastric catheter extends into the stomach. Vascular/Lymphatic: No significant vascular findings are present. No enlarged abdominal or pelvic lymph nodes. Reproductive: Uterus is within normal limits. There is again noted a large cystic mass lesion arising likely from the left ovary measuring approximately 21.7 cm in greatest transverse dimension. This is roughly similar to that seen on the prior exam. Other: Mild free fluid is noted slightly increased from the prior exam. Mild changes of anasarca are noted. Fat containing umbilical hernia is again seen and stable. Musculoskeletal: Degenerative changes of lumbar spine are noted. IMPRESSION: Increase in the degree of small-bowel obstruction with a transition zone in the right mid abdomen although no definitive mass lesion is seen. Stable large pelvic cystic mass likely ovarian in etiology. Cholelithiasis. Fatty infiltration of the liver. Electronically Signed   By: Inez Catalina M.D.   On: 06/14/2020 13:22   DG Abd 1 View  Result Date: 06/14/2020 CLINICAL DATA:  59 year old female with small bowel obstruction. EXAM: ABDOMEN - 1 VIEW COMPARISON:  CT abdomen pelvis dated 06/14/2020. FINDINGS: Enteric tube with  tip in the mid to distal stomach. Mildly dilated small bowel loops in the upper abdomen measuring 4.4 cm in diameter. IMPRESSION: Enteric tube with tip in the mid to distal stomach. Electronically Signed   By: Anner Crete M.D.   On: 06/14/2020 20:03   DG Abd 1 View  Result Date: 06/14/2020 CLINICAL DATA:  Abdominal distention.  Vomiting. EXAM: ABDOMEN - 1 VIEW COMPARISON:  CT 02/08/2020. FINDINGS: Several loops of slightly prominent small bowel noted. No colonic distention. Stool in the colon. No free air visualized. No pathologic intra-abdominal calcifications identified. Increased density noted in the pelvis possibly related to previously identified large cystic pelvic mass. No acute bony abnormality. IMPRESSION: 1. Several loops of slightly prominent small bowel noted. Follow-up exam to demonstrate resolution suggested. 2. Increased density noted the pelvis possibly related to previously identified large cystic pelvic mass. Electronically Signed   By: Marcello Moores  Register   On: 06/14/2020 05:22   DG CHEST PORT 1 VIEW  Result Date: 06/14/2020 CLINICAL  DATA:  New nasogastric tube and central line EXAM: PORTABLE CHEST 1 VIEW COMPARISON:  06/21/2020 FINDINGS: Nasogastric tube is looped in the stomach, with the tip at the gastric body to antrum. The upper chest is minimally excluded. Pacer/AICD device. Right internal jugular line tip at mid right atrium. Normal heart size for level of inspiration. No pleural effusion or pneumothorax. No congestive failure. No lobar consolidation. Low lung volumes with resultant pulmonary interstitial prominence. IMPRESSION: Nasogastric tube appropriately positioned. Right-sided internal jugular line tip at mid right atrium. No pneumothorax or other acute complication. Low lung volumes, without acute disease. Electronically Signed   By: Abigail Miyamoto M.D.   On: 06/14/2020 11:07    Anti-infectives: Anti-infectives (From admission, onward)   Start     Dose/Rate Route  Frequency Ordered Stop   06/14/20 1230  metroNIDAZOLE (FLAGYL) IVPB 500 mg     Discontinue     500 mg 100 mL/hr over 60 Minutes Intravenous Every 8 hours 06/14/20 1142     06/14/20 1100  ceFEPIme (MAXIPIME) 2 g in sodium chloride 0.9 % 100 mL IVPB     Discontinue     2 g 200 mL/hr over 30 Minutes Intravenous Every 12 hours 06/14/20 1003     06/13/20 1030  cefTRIAXone (ROCEPHIN) 2 g in sodium chloride 0.9 % 100 mL IVPB  Status:  Discontinued        2 g 200 mL/hr over 30 Minutes Intravenous Every 24 hours 06/13/20 0933 06/14/20 1003   06/11/20 1500  vancomycin (VANCOREADY) IVPB 1250 mg/250 mL  Status:  Discontinued        1,250 mg 166.7 mL/hr over 90 Minutes Intravenous Every 24 hours 05/27/2020 1815 06/13/20 0933   06/11/20 0300  ceFEPIme (MAXIPIME) 2 g in sodium chloride 0.9 % 100 mL IVPB  Status:  Discontinued        2 g 200 mL/hr over 30 Minutes Intravenous Every 12 hours 05/30/2020 1815 06/13/20 0933   06/01/2020 1400  vancomycin (VANCOREADY) IVPB 2000 mg/400 mL        2,000 mg 200 mL/hr over 120 Minutes Intravenous  Once 06/01/2020 1347 06/02/2020 1739   06/03/2020 1345  vancomycin (VANCOCIN) IVPB 1000 mg/200 mL premix  Status:  Discontinued        1,000 mg 200 mL/hr over 60 Minutes Intravenous  Once 06/15/2020 1333 06/11/2020 1347   06/05/2020 1345  ceFEPIme (MAXIPIME) 2 g in sodium chloride 0.9 % 100 mL IVPB        2 g 200 mL/hr over 30 Minutes Intravenous  Once 06/06/2020 1333 05/30/2020 1733       Assessment/Plan  Hypotension - question some component of dehydration given down time and reported couple weeks of minima oral intake CHF ICD Chronic venous stasis wounds HLD HTN Acute on Chronic kidney disease with oliguria - Cr 2.77  Sepsis, unclear etiology Abdominal pain/umbilical hernia/SBO/ileus - hernia remains reduced, although patient still appears most ttp over umbilicus.   - The rest of her abdominal exam was fairly benign.   - film overnight still shows dilated small bowel and  patient is more distended this AM - more acidotic today, lactate 4.4 - WBC 17 from 13, febrile - still no clear etiology for sepsis - plan exploratory laparotomy today - discussed with patient's sister; high risk of patient needing to remain intubated and returning to unit with open abdomen and need for return trips to the OR   FEN - NPO/NGT VTE - heparin ID - cefepime  LOS: 5 days    Norm Parcel , [facility] 06/22/2020, 7:40 AM Please see Amion for pager number during day hours 7:00am-4:30pm

## 2020-06-15 NOTE — Anesthesia Postprocedure Evaluation (Signed)
Anesthesia Post Note  Patient: Kari [name]  Procedure(s) Performed: EXPLORATORY LAPAROTOMY (N/A Abdomen) SMALL BOWEL RESECTION (N/A Abdomen)     Patient location during evaluation: ICU Anesthesia Type: General Level of consciousness: patient remains intubated per anesthesia plan Pain management: pain level controlled Vital Signs Assessment: post-procedure vital signs reviewed and stable Respiratory status: patient remains intubated per anesthesia plan Cardiovascular status: stable Postop Assessment: no apparent nausea or vomiting Anesthetic complications: no   No complications documented.  Last Vitals:  Vitals:   05/26/2020 1615 05/25/2020 1705  BP: 106/82   Pulse: 91 88  Resp: (!) 21 (!) 21  Temp:    SpO2: 97% 100%    Last Pain:  Vitals:   05/25/2020 1705  TempSrc: Oral  PainSc:                  Jermarion Poffenberger

## 2020-06-15 NOTE — Progress Notes (Signed)
Pt did not receive last 2 K runs.  Spoke w/ Dr. Carlis Abbott who states ok to hold.

## 2020-06-15 NOTE — Progress Notes (Signed)
NAME:  Kari [name], MRN:  [id number], DOB:  [birth year], LOS: 5 ADMISSION DATE:  06/09/2020, CONSULTATION DATE:  06/04/2020 REFERRING MD:  Kari Shave, MD, CHIEF COMPLAINT:  Weak  Brief History   [name] is a 59 y.o. 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  She was down for 13-14 hours, until her sister came to check on her and found her on the floor.  The patient reports not eating or drinking well over the past 2 weeks and has been feeling progressively weaker.  She lives alone and normally walks with a cane.  She denies any fever, cough, congestion, runny nose, pain with urination or diarrhea.  She has chronic venous stasis ulcers BLE, follows with Dr. Sharol [name], and states that she feels like her wounds are improving and denies increased redness, pain or edema.   Upon arrival to the ED, code sepsis was called due to hypotension and hypothermia (96.6 F). Lactic acid 2.0, WBC 12.0, hemoglobin 11.5, potassium 2.8, creatinine 2.40, albumin 1.4.  UA showed hazy urine with protein 30, few bacteria.  Chest x-ray showed no acute disease. Initial EKG showed sinus rhythm with PVCs, repeat EKG showed right bundle branch block with prolonged QTc  of 561.  Potassium was repleted with 10 mEq x3 and IV fluids were started at 30 cc/kg, 1 L of 5% albumin and another liter of crystalloid.  Past Medical History   Past Medical History:  Diagnosis Date  . Arthritis   . Asthma   . CHF (congestive heart failure) ([place]) 03/12/2014   a. EF 40-45% by echo in 06/2017 with normal cors by cath. ICD placed following  VT arrest  . Depression   . Headache(784.0)   . History of blood transfusion   . Hyperlipidemia   . Hypertension   . Lung nodules   . Myocardial infarction ([place]) 06/26/2014  . Renal disorder    followed by [facility]  . Sarcoidosis   . Ulcer    recurring, from chronic venous insufficiency   Significant Hospital Events   6/22 back on pressors, CVC placed, SBO 6/23   Consults:  PCCM  Procedures:  6/22 CVC  Significant Diagnostic Tests:  Echo 6/19 > EF 50-55%. CT A / P 6/22 > SBO, persistent large pelvic cyst  Micro Data:  SARS Coronavirus 2 - Negative Blood: NG Urine: polymicrobial  Antimicrobials:  Vancomycin 6/18 > 6/21 Cefepime 6/18 > 6/21.  6/22 >  Ceftriaxone 6/21 > 6/22  Interim history/subjective:  More encephalopathic. Received additional IV for increasing LA overnight. Febrile.  Objective   Blood pressure 99/66, pulse 79, temperature (!) 100.8 F (38.2 C), temperature source Temporal, resp. rate (!) 29, height 5\' 7"  (1.702 m), weight 110.3 kg, last menstrual period 11/21/2011, SpO2 100 %. CVP:  [6 mmHg-7 mmHg] 7 mmHg      Intake/Output Summary (Last 24 hours) at 06/08/2020 0742 Last data filed at 06/07/2020 0600 Gross per 24 hour  Intake 5187.6 ml  Output 805 ml  Net 4382.6 ml   Filed Weights   06/13/2020 1316 06/19/2020 2320 06/11/20 1430  Weight: 102.1 kg 107.2 kg 110.3  kg   Examination: General: ill appering woman laying in bed, confused, minimally alert and responsive to stimulation Neuro: confused, fatigued appearing, moving her arm over abdomen on exam, but not interactive otherwise HEENT: Dent/AT, eyes anicteric, NGT in place Cardiovascular: irreg rhythm, regular rate Lungs: Respirations even and unlabored.  CTA bilaterally, No W/R/R.  Abdomen: BS hypoactive. Slightly distended.  Tender to palpation. Musculoskeletal: No gross deformities, no edema.  Wounds as seen in photos below. Skin: Leg dressings C/D/I.  Intact, warm, no  rashes.    Assessment & Plan:   Septic shock - Concerned due to SBO. Cultures negative.  New lactic acidosis - Continue pressors as required to maintain MAP >65 - con't broad spectrum antibitoics -discussed with surgery- ex-lap this morning. Likely will have oepn abdomen after -NGT to suction -A-line this AM; sister consented over phone  Progressive weakness s/p ground-level fall. -PT recommending SNF. -anticipate prolonged hospitalization and recovery  AKI on CKD stage III, likely pre-renal - due to poor oral intake past 2 weeks. -con't to monitor -volume resuscitation -avoid nephrotoxic meds, renally dose meds  Coagulopathy -2 FFP -type & screen -sister consented for blood over phone  Chronic venous stasis and ulcers (followed by Dr. Sharol [name]) - Continue routine wound care. -needs ortho consult to remove staples post-op  Protein malnutrition with severe hypoalbuminemia  -may require TPN post op depending on return of bowel function and required intervention  Prolonged QTc -optimize electrolytes  Acute encephalopathy -intubate for pending respiratory failure  Best practice:  Diet:  NPO. Pain/Anxiety/Delirium protocol (if indicated): n/a VAP protocol (if indicated): yes DVT prophylaxis:  On hold fo rOR GI prophylaxis: n/a Mobility:  Bedrest   Code Status: Full Family Communication: Sister updated over phone Disposition: ICU   This patient is critically ill with multiple organ system failure which requires frequent high complexity decision making, assessment, support, evaluation, and titration of therapies. This was completed through the application of advanced monitoring technologies and extensive interpretation of multiple databases. During this encounter critical care time was devoted to patient care services described in this note for 40 minutes.  Kari Hy, DO 05/29/2020 8:12 AM [facility]

## 2020-06-15 NOTE — Interval H&P Note (Signed)
History and Physical Interval Note:  06/03/2020 10:49 AM  [place]  has presented today for surgery, with the diagnosis of SEPSIS OF UNKNOWN ORIGIN.  The various methods of treatment have been discussed with the patient and family. After consideration of risks, benefits and other options for treatment, the patient has consented to  Procedure(s): EXPLORATORY LAPAROTOMY (N/A) as a surgical intervention.  The patient's history has been reviewed, patient examined, no change in status, stable for surgery.  I have reviewed the patient's chart and labs.  Questions were answered to the patient's satisfaction.     Autumn Messing III

## 2020-06-15 NOTE — Progress Notes (Signed)
Attempted to see pt. Nursing stated pt has had a decline and is not medically ready for therapy today. Will attempt back as schedule allows. Jinger Neighb[place], [place] [phone]

## 2020-06-15 NOTE — Procedures (Signed)
Arterial Catheter Insertion Procedure Note Kari [name] [id number] 12-09-1961  Procedure: Insertion of Arterial Catheter  Indications: Blood pressure monitoring  Procedure Details Consent: Risks of procedure as well as the alternatives and risks of each were explained to the (patient/caregiver).  Consent for procedure obtained. Time Out: Verified patient identification, verified procedure, site/side was marked, verified correct patient position, special equipment/implants available, medications/allergies/relevent history reviewed, required imaging and test results available.  Performed  Maximum sterile technique was used including antiseptics, cap, gloves, gown, hand hygiene, mask and sheet. Skin prep: Chlorhexidine;  Pulsatile L ulnar artery confirmed prior to placement. 20 gauge catheter was inserted into left radial artery using the Seldinger technique. 1 attempt. No complications. ULTRASOUND GUIDANCE USED: YES Evaluation Blood flow good; BP tracing good. Complications: No apparent complications.    Julian Hy 06/03/2020

## 2020-06-16 ENCOUNTER — Encounter (HOSPITAL_COMMUNITY): Payer: Self-pay | Admitting: General Surgery

## 2020-06-16 ENCOUNTER — Ambulatory Visit: Payer: Medicare Other | Admitting: Orthopedic Surgery

## 2020-06-16 DIAGNOSIS — I472 Ventricular tachycardia: Secondary | ICD-10-CM

## 2020-06-16 DIAGNOSIS — Z9911 Dependence on respirator [ventilator] status: Secondary | ICD-10-CM

## 2020-06-16 DIAGNOSIS — K659 Peritonitis, unspecified: Secondary | ICD-10-CM

## 2020-06-16 LAB — BPAM FFP
Blood Product Expiration Date: 202106262359
Blood Product Expiration Date: 202106262359
ISSUE DATE / TIME: 202106230929
ISSUE DATE / TIME: 202106231021
Unit Type and Rh: 6200
Unit Type and Rh: 6200

## 2020-06-16 LAB — CBC
HCT: 42.7 % (ref 36.0–46.0)
HCT: 27.5 % — ABNORMAL LOW (ref 36.0–46.0)
Hemoglobin: 14.5 g/dL (ref 12.0–15.0)
Hemoglobin: 9.3 g/dL — ABNORMAL LOW (ref 12.0–15.0)
MCH: 30.3 pg (ref 26.0–34.0)
MCH: 30 pg (ref 26.0–34.0)
MCHC: 34 g/dL (ref 30.0–36.0)
MCHC: 33.8 g/dL (ref 30.0–36.0)
MCV: 89.1 fL (ref 80.0–100.0)
MCV: 88.7 fL (ref 80.0–100.0)
Platelets: 77 10*3/uL — ABNORMAL LOW (ref 150–400)
Platelets: 58 10*3/uL — ABNORMAL LOW (ref 150–400)
RBC: 4.79 MIL/uL (ref 3.87–5.11)
RBC: 3.1 MIL/uL — ABNORMAL LOW (ref 3.87–5.11)
RDW: 16.6 % — ABNORMAL HIGH (ref 11.5–15.5)
RDW: 16.5 % — ABNORMAL HIGH (ref 11.5–15.5)
WBC: 21 10*3/uL — ABNORMAL HIGH (ref 4.0–10.5)
WBC: 19 10*3/uL — ABNORMAL HIGH (ref 4.0–10.5)
nRBC: 0.2 % (ref 0.0–0.2)
nRBC: 0 % (ref 0.0–0.2)

## 2020-06-16 LAB — PREPARE FRESH FROZEN PLASMA
Unit division: 0
Unit division: 0

## 2020-06-16 LAB — LACTIC ACID, PLASMA
Lactic Acid, Venous: 6.1 mmol/L (ref 0.5–1.9)
Lactic Acid, Venous: 7.1 mmol/L (ref 0.5–1.9)
Lactic Acid, Venous: 7.5 mmol/L (ref 0.5–1.9)
Lactic Acid, Venous: 8.6 mmol/L (ref 0.5–1.9)
Lactic Acid, Venous: 9 mmol/L (ref 0.5–1.9)
Lactic Acid, Venous: >11 mmol/L (ref 0.5–1.9)
Lactic Acid, Venous: >11 mmol/L (ref 0.5–1.9)

## 2020-06-16 LAB — BASIC METABOLIC PANEL
Anion gap: 21 — ABNORMAL HIGH (ref 5–15)
BUN: 64 mg/dL — ABNORMAL HIGH (ref 6–20)
CO2: 15 mmol/L — ABNORMAL LOW (ref 22–32)
Calcium: 6 mg/dL — CL (ref 8.9–10.3)
Chloride: 109 mmol/L (ref 98–111)
Creatinine, Ser: 2.63 mg/dL — ABNORMAL HIGH (ref 0.44–1.00)
GFR calc Af Amer: 22 mL/min — ABNORMAL LOW (ref >60)
GFR calc non Af Amer: 19 mL/min — ABNORMAL LOW (ref >60)
Glucose, Bld: 200 mg/dL — ABNORMAL HIGH (ref 70–99)
Potassium: 4.1 mmol/L (ref 3.5–5.1)
Sodium: 145 mmol/L (ref 135–145)

## 2020-06-16 LAB — PHOSPHORUS
Phosphorus: 1.7 mg/dL — ABNORMAL LOW (ref 2.5–4.6)
Phosphorus: 3.6 mg/dL (ref 2.5–4.6)

## 2020-06-16 LAB — GLUCOSE, CAPILLARY
Glucose-Capillary: 135 mg/dL — ABNORMAL HIGH (ref 70–99)
Glucose-Capillary: 132 mg/dL — ABNORMAL HIGH (ref 70–99)
Glucose-Capillary: 116 mg/dL — ABNORMAL HIGH (ref 70–99)
Glucose-Capillary: 175 mg/dL — ABNORMAL HIGH (ref 70–99)
Glucose-Capillary: 201 mg/dL — ABNORMAL HIGH (ref 70–99)
Glucose-Capillary: 204 mg/dL — ABNORMAL HIGH (ref 70–99)

## 2020-06-16 LAB — MAGNESIUM
Magnesium: 1.2 mg/dL — ABNORMAL LOW (ref 1.7–2.4)
Magnesium: 2.7 mg/dL — ABNORMAL HIGH (ref 1.7–2.4)

## 2020-06-16 MED ORDER — SODIUM CHLORIDE 0.9 % IV SOLN
100.0000 mg | INTRAVENOUS | Status: DC
  Administered 2020-06-17 – 2020-06-18 (×2): 100 mg via INTRAVENOUS
  Filled 2020-06-16 (×2): qty 100

## 2020-06-16 MED ORDER — ALBUMIN HUMAN 5 % IV SOLN
INTRAVENOUS | Status: AC
  Filled 2020-06-16: qty 250

## 2020-06-16 MED ORDER — DEXTROSE 5 % IV SOLN
INTRAVENOUS | Status: DC
  Administered 2020-06-16: 100 mL/h via INTRAVENOUS

## 2020-06-16 MED ORDER — POTASSIUM PHOSPHATES 15 MMOLE/5ML IV SOLN
30.0000 mmol | Freq: Once | INTRAVENOUS | Status: AC
  Administered 2020-06-16: 30 mmol via INTRAVENOUS
  Filled 2020-06-16: qty 10

## 2020-06-16 MED ORDER — ALBUMIN HUMAN 5 % IV SOLN
25.0000 g | Freq: Once | INTRAVENOUS | Status: AC
  Administered 2020-06-17: 25 g via INTRAVENOUS
  Filled 2020-06-16: qty 500

## 2020-06-16 MED ORDER — POTASSIUM CHLORIDE 10 MEQ/50ML IV SOLN
10.0000 meq | INTRAVENOUS | Status: AC
  Administered 2020-06-16 (×2): 10 meq via INTRAVENOUS

## 2020-06-16 MED ORDER — SODIUM CHLORIDE 0.9 % IV SOLN
2.0000 g | INTRAVENOUS | Status: DC
  Administered 2020-06-16 – 2020-06-18 (×3): 2 g via INTRAVENOUS
  Filled 2020-06-16 (×3): qty 2

## 2020-06-16 MED ORDER — ALBUMIN HUMAN 25 % IV SOLN
50.0000 g | Freq: Once | INTRAVENOUS | Status: AC
  Administered 2020-06-16: 50 g via INTRAVENOUS
  Filled 2020-06-16: qty 200

## 2020-06-16 MED ORDER — LACTATED RINGERS IV BOLUS
1000.0000 mL | Freq: Once | INTRAVENOUS | Status: AC
  Administered 2020-06-16: 1000 mL via INTRAVENOUS

## 2020-06-16 MED ORDER — MAGNESIUM SULFATE 4 GM/100ML IV SOLN
4.0000 g | Freq: Once | INTRAVENOUS | Status: AC
  Administered 2020-06-16: 4 g via INTRAVENOUS
  Filled 2020-06-16: qty 100

## 2020-06-16 MED ORDER — HEPARIN SODIUM (PORCINE) 5000 UNIT/ML IJ SOLN
5000.0000 [IU] | Freq: Three times a day (TID) | INTRAMUSCULAR | Status: AC
  Administered 2020-06-16 (×2): 5000 [IU] via SUBCUTANEOUS
  Filled 2020-06-16 (×2): qty 1

## 2020-06-16 MED ORDER — INSULIN ASPART 100 UNIT/ML ~~LOC~~ SOLN
1.0000 [IU] | SUBCUTANEOUS | Status: DC
  Administered 2020-06-16 (×2): 3 [IU] via SUBCUTANEOUS
  Administered 2020-06-16 – 2020-06-18 (×9): 2 [IU] via SUBCUTANEOUS

## 2020-06-16 MED ORDER — CALCIUM GLUCONATE-NACL 2-0.675 GM/100ML-% IV SOLN
2.0000 g | Freq: Once | INTRAVENOUS | Status: AC
  Administered 2020-06-16: 2000 mg via INTRAVENOUS
  Filled 2020-06-16 (×2): qty 100

## 2020-06-16 MED ORDER — POTASSIUM CHLORIDE 10 MEQ/50ML IV SOLN
10.0000 meq | INTRAVENOUS | Status: AC
  Administered 2020-06-16 (×4): 10 meq via INTRAVENOUS
  Filled 2020-06-16 (×4): qty 50

## 2020-06-16 MED ORDER — SODIUM BICARBONATE-DEXTROSE 150-5 MEQ/L-% IV SOLN
150.0000 meq | INTRAVENOUS | Status: DC
  Administered 2020-06-16 – 2020-06-18 (×8): 150 meq via INTRAVENOUS
  Filled 2020-06-16 (×11): qty 1000

## 2020-06-16 MED ORDER — SODIUM CHLORIDE 0.9 % IV SOLN
200.0000 mg | Freq: Once | INTRAVENOUS | Status: AC
  Administered 2020-06-16: 200 mg via INTRAVENOUS
  Filled 2020-06-16: qty 200

## 2020-06-16 NOTE — Progress Notes (Signed)
PT Cancellation Note  Patient Details Name: Kari [name] MRN: [id number] DOB: 25-Sep-1961   Cancelled Treatment:    Reason Eval/Treat Not Completed: Patient not medically ready.  Sedated on the vent.  Pt not able to participate today. 06/16/2020  Ginger Carne., PT Acute Rehabilitation Services [phone]  (pager) [phone]  (office)   Kari [name] 06/16/2020, 10:23 AM

## 2020-06-16 NOTE — Progress Notes (Signed)
Nurse to call GYN, Dr. Rosana Hoes [phone], when pt is schedule for surgery.

## 2020-06-16 NOTE — Progress Notes (Signed)
[facility] Progress Note  1 Day Post-Op  Subjective: Sedated on the vent. Requiring 2 pressors. Large volume output SS fluid from open abdominal VAC overnight. Febrile with worsening lactic acidosis.   Objective: Vital signs in last 24 hours: Temp:  [94 F (34.4 C)-101.1 F (38.4 C)] 101.1 F (38.4 C) (06/24 0743) Pulse Rate:  [25-104] 104 (06/24 0720) Resp:  [12-33] 29 (06/24 0720) BP: (72-135)/(26-110) 75/56 (06/24 0720) SpO2:  [91 %-100 %] 100 % (06/24 0720) Arterial Line BP: (65-131)/(50-80) 84/57 (06/24 0700) FiO2 (%):  [30 %-50 %] 30 % (06/24 0720) Last BM Date: 06/14/20  Intake/Output from previous day: 06/23 0701 - 06/24 0700 In: 9063 [I.V.:5422.1; Blood:630; IV Piggyback:3010.9] Out: 2770 [Urine:220; Drains:2500; Blood:50] Intake/Output this shift: No intake/output data recorded.  PE: General: sedated on the vent and critically ill  Heart: tachycardic and hypotensive  Lungs: ventilator sounds, rales bilaterally  Abd: soft, VAC present to midline with SS fluid, no evisceration around VAC dressing, involuntary guarding in upper abdomen, NGT with feculent appearing drainage    Lab Results:  Recent Labs    05/30/2020 2203 06/16/20 0532  WBC 13.8* 21.0*  HGB 14.9 14.5  HCT 44.2 42.7  PLT 82* 77*   BMET Recent Labs    06/01/2020 1857 06/21/2020 2215  NA 149* 150*  K 3.8 3.6  CL 113* 111  CO2 16* 19*  GLUCOSE 203* 218*  BUN 79* 78*  CREATININE 2.71* 2.80*  CALCIUM 7.2* 7.2*   PT/INR Recent Labs    06/09/2020 0845 06/07/2020 1857  LABPROT 19.9* 19.8*  INR 1.8* 1.7*   CMP     Component Value Date/Time   NA 150 (H) 06/01/2020 2215   NA 140 12/31/2019 1044   K 3.6 06/08/2020 2215   CL 111 05/26/2020 2215   CO2 19 (L) 06/13/2020 2215   GLUCOSE 218 (H) 06/17/2020 2215   BUN 78 (H) 06/21/2020 2215   BUN 18 12/31/2019 1044   CREATININE 2.80 (H) 05/30/2020 2215   CREATININE 0.83 03/31/2014 1010   CALCIUM 7.2 (L) 06/05/2020 2215   PROT 4.0  (L) 06/16/2020 1857   PROT 7.5 12/31/2019 1044   ALBUMIN 1.7 (L) 06/14/2020 1857   ALBUMIN 3.5 (L) 12/31/2019 1044   AST 20 05/29/2020 1857   ALT 18 06/13/2020 1857   ALKPHOS 48 06/02/2020 1857   BILITOT 1.7 (H) 05/26/2020 1857   BILITOT 0.7 12/31/2019 1044   GFRNONAA 18 (L) 06/08/2020 2215   GFRAA 21 (L) 05/31/2020 2215   Lipase     Component Value Date/Time   LIPASE 162 (H) 06/14/2020 1547       Studies/Results: CT ABDOMEN PELVIS WO CONTRAST  Result Date: 06/14/2020 CLINICAL DATA:  Abdominal pain and distension EXAM: CT ABDOMEN AND PELVIS WITHOUT CONTRAST TECHNIQUE: Multidetector CT imaging of the abdomen and pelvis was performed following the standard protocol without IV contrast. COMPARISON:  02/08/2020 FINDINGS: Lower chest: Minimal basilar scarring is noted. Gastric catheter is noted coiled in the stomach. Hepatobiliary: Liver demonstrates diffuse geographic decreased attenuation consistent with fatty infiltration. Gallbladder is well distended with hyperdense material as well as a few small gallstones. No wall thickening or pericholecystic fluid is noted. Pancreas: Unremarkable. No pancreatic ductal dilatation or surrounding inflammatory changes. Spleen: Normal in size without focal abnormality. Adrenals/Urinary Tract: Adrenal glands are within normal limits. Kidneys demonstrate no renal calculi or urinary tract obstructive changes. The bladder is partially distended. Stomach/Bowel: Colon is decompressed. The appendix is not well visualized. No inflammatory changes to  suggest appendicitis are noted. Significant small bowel dilatation is noted throughout almost the entire small bowel with a transition zone in the right mid abdomen. No definitive mass lesion is seen to correspond with the transition zone although it appears somewhat similar to that seen on prior exam. Gastric catheter extends into the stomach. Vascular/Lymphatic: No significant vascular findings are present. No enlarged  abdominal or pelvic lymph nodes. Reproductive: Uterus is within normal limits. There is again noted a large cystic mass lesion arising likely from the left ovary measuring approximately 21.7 cm in greatest transverse dimension. This is roughly similar to that seen on the prior exam. Other: Mild free fluid is noted slightly increased from the prior exam. Mild changes of anasarca are noted. Fat containing umbilical hernia is again seen and stable. Musculoskeletal: Degenerative changes of lumbar spine are noted. IMPRESSION: Increase in the degree of small-bowel obstruction with a transition zone in the right mid abdomen although no definitive mass lesion is seen. Stable large pelvic cystic mass likely ovarian in etiology. Cholelithiasis. Fatty infiltration of the liver. Electronically Signed   By: Inez Catalina M.D.   On: 06/14/2020 13:22   DG Abd 1 View  Result Date: 06/14/2020 CLINICAL DATA:  59 year old female with small bowel obstruction. EXAM: ABDOMEN - 1 VIEW COMPARISON:  CT abdomen pelvis dated 06/14/2020. FINDINGS: Enteric tube with tip in the mid to distal stomach. Mildly dilated small bowel loops in the upper abdomen measuring 4.4 cm in diameter. IMPRESSION: Enteric tube with tip in the mid to distal stomach. Electronically Signed   By: Anner Crete M.D.   On: 06/14/2020 20:03   DG CHEST PORT 1 VIEW  Result Date: 05/31/2020 CLINICAL DATA:  Endotracheal tube. EXAM: PORTABLE CHEST 1 VIEW COMPARISON:  June 14, 2020. FINDINGS: Stable cardiomediastinal silhouette. Endotracheal nasogastric tubes are unchanged in position. Left-sided pacemaker is unchanged in position. Right internal jugular catheter is noted, but tip has been withdrawn to the SVC. Lungs are clear. No pneumothorax or pleural effusion is noted. Bony thorax is unremarkable. IMPRESSION: No acute cardiopulmonary abnormality seen. Stable support apparatus. Right internal jugular catheter tip has been withdrawn to the SVC. Electronically Signed    By: Marijo Conception M.D.   On: 06/16/2020 09:11   DG CHEST PORT 1 VIEW  Result Date: 06/14/2020 CLINICAL DATA:  New nasogastric tube and central line EXAM: PORTABLE CHEST 1 VIEW COMPARISON:  06/09/2020 FINDINGS: Nasogastric tube is looped in the stomach, with the tip at the gastric body to antrum. The upper chest is minimally excluded. Pacer/AICD device. Right internal jugular line tip at mid right atrium. Normal heart size for level of inspiration. No pleural effusion or pneumothorax. No congestive failure. No lobar consolidation. Low lung volumes with resultant pulmonary interstitial prominence. IMPRESSION: Nasogastric tube appropriately positioned. Right-sided internal jugular line tip at mid right atrium. No pneumothorax or other acute complication. Low lung volumes, without acute disease. Electronically Signed   By: Abigail Miyamoto M.D.   On: 06/14/2020 11:07    Anti-infectives: Anti-infectives (From admission, onward)   Start     Dose/Rate Route Frequency Ordered Stop   06/16/20 2200  ceFEPIme (MAXIPIME) 2 g in sodium chloride 0.9 % 100 mL IVPB     Discontinue     2 g 200 mL/hr over 30 Minutes Intravenous Every 24 hours 05/26/2020 1033     05/26/2020 1630  metroNIDAZOLE (FLAGYL) IVPB 500 mg     Discontinue     500 mg 100 mL/hr over 60 Minutes  Intravenous Every 8 hours 06/13/2020 1615     06/14/20 1230  metroNIDAZOLE (FLAGYL) IVPB 500 mg  Status:  Discontinued        500 mg 100 mL/hr over 60 Minutes Intravenous Every 8 hours 06/14/20 1142 05/27/2020 1615   06/14/20 1100  ceFEPIme (MAXIPIME) 2 g in sodium chloride 0.9 % 100 mL IVPB  Status:  Discontinued        2 g 200 mL/hr over 30 Minutes Intravenous Every 12 hours 06/14/20 1003 06/09/2020 1033   06/13/20 1030  cefTRIAXone (ROCEPHIN) 2 g in sodium chloride 0.9 % 100 mL IVPB  Status:  Discontinued        2 g 200 mL/hr over 30 Minutes Intravenous Every 24 hours 06/13/20 0933 06/14/20 1003   06/11/20 1500  vancomycin (VANCOREADY) IVPB 1250 mg/250 mL   Status:  Discontinued        1,250 mg 166.7 mL/hr over 90 Minutes Intravenous Every 24 hours 06/14/2020 1815 06/13/20 0933   06/11/20 0300  ceFEPIme (MAXIPIME) 2 g in sodium chloride 0.9 % 100 mL IVPB  Status:  Discontinued        2 g 200 mL/hr over 30 Minutes Intravenous Every 12 hours 05/28/2020 1815 06/13/20 0933   06/04/2020 1400  vancomycin (VANCOREADY) IVPB 2000 mg/400 mL        2,000 mg 200 mL/hr over 120 Minutes Intravenous  Once 06/16/2020 1347 06/08/2020 1739   06/08/2020 1345  vancomycin (VANCOCIN) IVPB 1000 mg/200 mL premix  Status:  Discontinued        1,000 mg 200 mL/hr over 60 Minutes Intravenous  Once 06/13/2020 1333 05/31/2020 1347   06/13/2020 1345  ceFEPIme (MAXIPIME) 2 g in sodium chloride 0.9 % 100 mL IVPB        2 g 200 mL/hr over 30 Minutes Intravenous  Once 05/30/2020 1333 06/02/2020 1733       Assessment/Plan CHF ICD Chronic venous stasis wounds HLD HTN Acute on Chronic kidney disease with oliguria   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 Marlou Starks - POD#1 - pathology and cytology studies pending, CEA and CA 125 pending - patient with increasing lactate 7.1>8.6>9.0 - requiring 2 pressors   - overall prognosis very poor. If stable enough to return to the OR will plan to go back tomorrow - have consulted GYN and discussed possible intervention for large ovarian cyst while in OR  - continue NGT on LIWS - bowel is in discontinuity currently - will discuss PICC and TPN with attending   FEN -NPO/NGT VTE -heparin SQ ID -cefepime/flagyl  LOS: 6 days    Norm Parcel , [facility] 06/16/2020, 8:52 AM Please see Amion for pager number during day hours 7:00am-4:30pm

## 2020-06-16 NOTE — Consult Note (Signed)
OB/GYN Consult Note  Referring Provider: Dr. Ovid Curd Kari [name] is a 59 y.o. admitted for sepsis, found to have SBO with perforation on ex lap by general surgery yesterday. 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 Abdomen left open for return to OR possibly tomorrow. Gyn consulted for possible removal of large, stable ovarian cyst concurrently.      Past Medical History:  Diagnosis Date  . Arthritis   . Asthma   . CHF (congestive heart failure) ([place]) 03/12/2014   a. EF 40-45% by echo in 06/2017 with normal cors by cath. ICD placed following VT arrest  . Depression   . Headache(784.0)   . History of blood transfusion   . Hyperlipidemia   . Hypertension   . Lung nodules   . Myocardial infarction ([place]) 06/26/2014  . Renal disorder    followed by [facility]  . Sarcoidosis   . Ulcer    recurring, from chronic venous insufficiency    Past Surgical History:  Procedure Laterality Date  . BOWEL RESECTION N/A 06/03/2020   Procedure: SMALL BOWEL RESECTION;  Surgeon: Jovita Kussmaul, MD;  Location: [facility];  Service: General;  Laterality: N/A;  . cataract surgery Left 07-2013  . I & D EXTREMITY Bilateral 12/31/2018   Procedure: DEBRIDEMENT BILATERAL LEGS, APPLY VAC X 2;  Surgeon: [name], MD;  Location: [facility];  Service: Orthopedics;  Laterality: Bilateral;  . I & D EXTREMITY Bilateral 01/02/2019   Procedure: REPEAT DEBRIDEMENT BILATERAL LEGS, APPLY VAC X 2;  Surgeon: [name], MD;  Location: [facility];  Service: Orthopedics;  Laterality: Bilateral;  . I & D EXTREMITY Bilateral 07/15/2019   Procedure: IRRIGATION AND DEBRIDEMENT VENOUS STASIS INSUFFICIENCY ULCERATIONS BILATERAL LOWER EXTREMITIES;  Surgeon: [name], MD;  Location: [facility];  Service: Orthopedics;  Laterality: Bilateral;  . I & D EXTREMITY Bilateral 08/23/2019   Procedure: DEBRIDEMENT EXTREMITY;  Surgeon: [name], MD;  Location: [facility];  Service: Orthopedics;  Laterality:  Bilateral;  . ICD IMPLANT N/A 07/05/2017   Procedure: ICD Implant;  Surgeon: Constance Haw, MD;  Location: [facility];  Service: Cardiovascular;  Laterality: N/A;  . LAPAROTOMY N/A 06/14/2020   Procedure: EXPLORATORY LAPAROTOMY;  Surgeon: Jovita Kussmaul, MD;  Location: [facility];  Service: General;  Laterality: N/A;  . LEFT HEART CATH AND CORONARY ANGIOGRAPHY N/A 07/03/2017   Procedure: Left Heart Cath and Coronary Angiography;  Surgeon: Belva Crome, MD;  Location: [facility];  Service: Cardiovascular;  Laterality: N/A;  . SKIN SPLIT GRAFT Bilateral 01/07/2019   Procedure: SPLIT THICKNESS SKIN GRAFT BILATERAL LEGS, APPLY VAC;  Surgeon: [name], MD;  Location: [facility];  Service: Orthopedics;  Laterality: Bilateral;  . SKIN SPLIT GRAFT Bilateral 07/17/2019   Procedure: REPEAT IRRIGATION AND DEBRIDEMENT BILATERAL LOWER EXTREMITIES, SKIN GRAFT;  Surgeon: [name], MD;  Location: [facility];  Service: Orthopedics;  Laterality: Bilateral;  . SKIN SPLIT GRAFT Bilateral 08/26/2019   Procedure: SKIN GRAFT SPLIT THICKNESS BILATERAL LEGS, APPLY WOUND VAC;  Surgeon: [name], MD;  Location: [facility];  Service: Orthopedics;  Laterality: Bilateral;    OB History  No obstetric history on file.    Social History   Socioeconomic History  . Marital status: Single    Spouse name: Not on file  . Number of children: 1  . Years of education: Not on file  . Highest education level: Not  on file  Occupational History  . Not on file  Tobacco Use  . Smoking status: Never Smoker  . Smokeless tobacco: Never Used  Vaping Use  . Vaping Use: Never used  Substance and Sexual Activity  . Alcohol use: No  . Drug use: No  . Sexual activity: Not Currently  Other Topics Concern  . Not on file  Social History Narrative   Lives alone.   Social Determinants of Health   Financial Resource Strain:   . Difficulty of Paying Living Expenses:   Food Insecurity:   . Worried About Ship broker in the Last Year:   . Arboriculturist in the Last Year:   Transportation Needs:   . Film/video editor (Medical):   Marland Kitchen Lack of Transportation (Non-Medical):   Physical Activity:   . Days of Exercise per Week:   . Minutes of Exercise per Session:   Stress:   . Feeling of Stress :   Social Connections:   . Frequency of Communication with Friends and Family:   . Frequency of Social Gatherings with Friends and Family:   . Attends Religious Services:   . Active Member of Clubs or Organizations:   . Attends Archivist Meetings:   Marland Kitchen Marital Status:     Family History  Problem Relation Age of Onset  . Heart failure Mother   . Diabetes Mellitus II Mother   . Hypertension Mother   . Cancer Mother        unknown type  . Heart disease Father   . Stroke Father   . Diabetes Mellitus II Father     Medications Prior to Admission  Medication Sig Dispense Refill Last Dose  . acetaminophen (TYLENOL) 325 MG tablet Take 1-2 tablets (325-650 mg total) by mouth every 4 (four) hours as needed for mild pain.   06/09/2020 at Unknown time  . albuterol (PROVENTIL HFA;VENTOLIN HFA) 108 (90 Base) MCG/ACT inhaler Inhale 2 puffs into the lungs every 6 (six) hours as needed for shortness of breath. 18 g 0 Past Month at Unknown time  . allopurinol (ZYLOPRIM) 100 MG tablet TAKE 2 TABLETS (200 MG TOTAL) BY MOUTH DAILY. (Patient taking differently: Take 100 mg by mouth in the morning and at bedtime. ) 60 tablet 0 06/09/2020 at Unknown time  . Ascorbic Acid (VITAMIN C) 1000 MG tablet Take 1,000 mg by mouth daily.   06/09/2020 at Unknown time  . aspirin EC 81 MG tablet Take 81 mg by mouth daily.   06/09/2020 at Unknown time  . carvedilol (COREG) 6.25 MG tablet Take 1 tablet (6.25 mg total) by mouth 2 (two) times daily. 60 tablet 0 06/09/2020 at 9 pm  . ferrous sulfate 325 (65 FE) MG EC tablet Take 325 mg by mouth 3 (three) times daily with meals.   06/09/2020 at Unknown time  . furosemide (LASIX) 40  MG tablet Take 40 mg by mouth daily.   06/09/2020 at Unknown time  . HYDROcodone-acetaminophen (NORCO/VICODIN) 5-325 MG tablet Take 1 tablet by mouth every 6 (six) hours as needed for moderate pain. 30 tablet 0 06/09/2020 at Unknown time  . senna-docusate (SENOKOT-S) 8.6-50 MG tablet Take 2 tablets by mouth 2 (two) times daily. 120 tablet 0 06/09/2020 at Unknown time  . ondansetron (ZOFRAN ODT) 4 MG disintegrating tablet Take 1 tablet (4 mg total) by mouth every 8 (eight) hours as needed for nausea or vomiting. (Patient not taking: Reported on 06/04/2020) 20 tablet 0 Not  Taking at Unknown time  . oxyCODONE-acetaminophen (PERCOCET) 5-325 MG tablet Take 1 tablet by mouth every 4 (four) hours as needed for severe pain. (Patient not taking: Reported on 06/21/2020) 20 tablet 0 Not Taking at Unknown time  . polyethylene glycol (MIRALAX / GLYCOLAX) 17 g packet Take 17 g by mouth daily as needed. (Patient not taking: Reported on 06/03/2020) 30 each 0 Not Taking at Unknown time   Allergies  Allergen Reactions  . Tramadol Other (See Comments)    hallucination   Review of Systems: Negative except for what is mentioned in HPI.     Physical Exam: BP 117/72   Pulse 87   Temp (!) 97.4 F (36.3 C) (Core)   Resp (!) 23   Ht '5\' 7"'  (1.702 m)   Wt 110.3 kg   LMP 11/21/2011   SpO2 100%   BMI 38.09 kg/m  CONSTITUTIONAL: Well-developed, on ventilator, pt not responsive HENT:  Normocephalic, atraumatic, ET tube in place EYES: closed SKIN: Skin is warm and dry. Marland Kitchen Spencer: unable to assess, pt intubated PSYCHIATRIC: unable to assess CARDIOVASCULAR: Normal heart rate noted RESPIRATORY: on ventilator ABDOMEN: Soft, with wound vac in midline vertical incision PELVIC: deferred MUSCULOSKELETAL: bruising on upper thighs  Pertinent Labs/Studies:   Results for orders placed or performed during the hospital encounter of 05/24/2020 (from the past 72 hour(s))  CBC     Status: Abnormal   Collection Time: 06/14/20  10:49 AM  Result Value Ref [place]   WBC 13.6 (H) 4.0 - 10.5 K/uL   RBC 3.71 (L) 3.87 - 5.11 MIL/uL   Hemoglobin 11.4 (L) 12.0 - 15.0 g/dL   HCT 34.8 (L) 36 - 46 %   MCV 93.8 80.0 - 100.0 fL   MCH 30.7 26.0 - 34.0 pg   MCHC 32.8 30.0 - 36.0 g/dL   RDW 17.7 (H) 11.5 - 15.5 %   Platelets 191 150 - 400 K/uL   nRBC 0.2 0.0 - 0.2 %    Comment: Performed at [facility], [address] [place], [place] [address]  Comprehensive metabolic panel     Status: Abnormal   Collection Time: 06/14/20 10:49 AM  Result Value Ref Range   Sodium 148 (H) 135 - 145 mmol/L   Potassium 3.7 3.5 - 5.1 mmol/L   Chloride 118 (H) 98 - 111 mmol/L   CO2 16 (L) 22 - 32 mmol/L   Glucose, Bld 87 70 - 99 mg/dL    Comment: Glucose reference range applies only to samples taken after fasting for at least 8 hours.   BUN 82 (H) 6 - 20 mg/dL   Creatinine, Ser 2.33 (H) 0.44 - 1.00 mg/dL   Calcium 7.5 (L) 8.9 - 10.3 mg/dL   Total Protein 4.7 (L) 6.5 - 8.1 g/dL   Albumin 1.6 (L) 3.5 - 5.0 g/dL   AST 10 (L) 15 - 41 U/L   ALT 20 0 - 44 U/L   Alkaline Phosphatase 56 38 - 126 U/L   Total Bilirubin 1.4 (H) 0.3 - 1.2 mg/dL   GFR calc non Af Amer 22 (L) >60 mL/min   GFR calc Af Amer 26 (L) >60 mL/min   Anion gap 14 5 - 15    Comment: Performed at [facility] [address] [place], [place] [address]  Magnesium     Status: Abnormal   Collection Time: 06/14/20 10:49 AM  Result Value Ref [place]   Magnesium 2.8 (H) 1.7 - 2.4 mg/dL  Comment: Performed at [facility], [place] [address], [place], [place] [address]  Lactic acid, plasma     Status: None   Collection Time: 06/14/20 10:49 AM  Result Value Ref Range   Lactic Acid, Venous 1.2 0.5 - 1.9 mmol/L    Comment: Performed at [facility] [address], [place], [place] [address]  .Cooxemetry Panel (carboxy, met, total hgb, O2 sat)     Status: None   Collection Time: 06/14/20 10:49 AM  Result Value Ref [place]   Total hemoglobin 12.0 12.0 - 16.0  g/dL   O2 Saturation 75.2 %   Carboxyhemoglobin 1.5 0.5 - 1.5 %   Methemoglobin 1.3 0.0 - 1.5 %    Comment: Performed at [facility], [place] [address], [place], [place] [address]  Basic metabolic panel     Status: Abnormal   Collection Time: 06/14/20  3:47 PM  Result Value Ref Range   Sodium 148 (H) 135 - 145 mmol/L   Potassium 3.8 3.5 - 5.1 mmol/L   Chloride 118 (H) 98 - 111 mmol/L   CO2 15 (L) 22 - 32 mmol/L   Glucose, Bld 83 70 - 99 mg/dL    Comment: Glucose reference range applies only to samples taken after fasting for at least 8 hours.   BUN 84 (H) 6 - 20 mg/dL   Creatinine, Ser 2.49 (H) 0.44 - 1.00 mg/dL   Calcium 7.6 (L) 8.9 - 10.3 mg/dL   GFR calc non Af Amer 21 (L) >60 mL/min   GFR calc Af Amer 24 (L) >60 mL/min   Anion gap 15 5 - 15    Comment: Performed at [facility] [address], [place], [place] [address]  Lipase, blood     Status: Abnormal   Collection Time: 06/14/20  3:47 PM  Result Value Ref Range   Lipase 162 (H) 11 - 51 U/L    Comment: Performed at [facility], [place] [address], [place], [place] [address]  Troponin I (High Sensitivity)     Status: None   Collection Time: 06/14/20  3:47 PM  Result Value Ref Range   Troponin I (High Sensitivity) 9 <18 ng/L    Comment: (NOTE) Elevated high sensitivity troponin I (hsTnI) values and significant  changes across serial measurements may suggest ACS but many other  chronic and acute conditions are known to elevate hsTnI results.  Refer to the "Links" section for chest pain algorithms and additional  guidance. Performed at [facility], [place] [address] [place], [place] [address]   Lactic acid, plasma     Status: None   Collection Time: 06/14/20  5:22 PM  Result Value Ref Range   Lactic Acid, Venous 1.5 0.5 - 1.9 mmol/L    Comment: Performed at [facility] [address] [place], [place] [address]  Troponin I (High Sensitivity)     Status: None   Collection Time: 06/14/20  6:12  PM  Result Value Ref Range   Troponin I (High Sensitivity) 9 <18 ng/L    Comment: (NOTE) Elevated high sensitivity troponin I (hsTnI) values and significant  changes across serial measurements may suggest ACS but many other  chronic and acute conditions are known to elevate hsTnI results.  Refer to the "Links" section for chest pain algorithms and additional  guidance. Performed at [facility], [place] [address], [place], [place] [address]   Protime-INR     Status: Abnormal  Collection Time: 06/14/20  6:12 PM  Result Value Ref Range   Prothrombin Time 19.2 (H) 11.4 - 15.2 seconds   INR 1.7 (H) 0.8 - 1.2    Comment: (NOTE) INR goal varies based on device and disease states. Performed at [facility], [place] [address], [place], [place] [address]   I-STAT 7, (LYTES, BLD GAS, ICA, H+H)     Status: Abnormal   Collection Time: 06/14/20  8:29 PM  Result Value Ref Range   pH, Arterial 7.409 7.35 - 7.45   pCO2 arterial 23.9 (L) 32 - 48 mmHg   pO2, Arterial 106 83 - 108 mmHg   Bicarbonate 15.1 (L) 20.0 - 28.0 mmol/L   TCO2 16 (L) 22 - 32 mmol/L   O2 Saturation 98.0 %   Acid-base deficit 8.0 (H) 0.0 - 2.0 mmol/L   Sodium 148 (H) 135 - 145 mmol/L   Potassium 3.8 3.5 - 5.1 mmol/L   Calcium, Ion 1.10 (L) 1.15 - 1.40 mmol/L   HCT 35.0 (L) 36 - 46 %   Hemoglobin 11.9 (L) 12.0 - 15.0 g/dL   Collection site Radial    Drawn by RT    Sample type ARTERIAL   Glucose, capillary     Status: Abnormal   Collection Time: 06/14/20  9:10 PM  Result Value Ref Range   Glucose-Capillary 59 (L) 70 - 99 mg/dL    Comment: Glucose reference range applies only to samples taken after fasting for at least 8 hours.  Glucose, capillary     Status: Abnormal   Collection Time: 06/14/20  9:30 PM  Result Value Ref Range   Glucose-Capillary 115 (H) 70 - 99 mg/dL    Comment: Glucose reference range applies only to samples taken after fasting for at least 8 hours.  Glucose, capillary     Status: None    Collection Time: 06/14/20 11:06 PM  Result Value Ref Range   Glucose-Capillary 99 70 - 99 mg/dL    Comment: Glucose reference range applies only to samples taken after fasting for at least 8 hours.  Glucose, capillary     Status: None   Collection Time: 06/09/2020  3:14 AM  Result Value Ref Range   Glucose-Capillary 99 70 - 99 mg/dL    Comment: Glucose reference range applies only to samples taken after fasting for at least 8 hours.  Lactic acid, plasma     Status: Abnormal   Collection Time: 06/07/2020  5:19 AM  Result Value Ref Range   Lactic Acid, Venous 4.4 (HH) 0.5 - 1.9 mmol/L    Comment: CRITICAL RESULT CALLED TO, READ BACK BY AND VERIFIED WITH: NIX G,RN 06/22/2020 [id number] Performed at [facility], [place] [address] [place], [place] [address]   CBC     Status: Abnormal   Collection Time: 06/20/2020  5:19 AM  Result Value Ref Range   WBC 17.2 (H) 4.0 - 10.5 K/uL   RBC 3.57 (L) 3.87 - 5.11 MIL/uL   Hemoglobin 11.0 (L) 12.0 - 15.0 g/dL   HCT 32.8 (L) 36 - 46 %   MCV 91.9 80.0 - 100.0 fL   MCH 30.8 26.0 - 34.0 pg   MCHC 33.5 30.0 - 36.0 g/dL   RDW 17.5 (H) 11.5 - 15.5 %   Platelets 162 150 - 400 K/uL   nRBC 0.4 (H) 0.0 - 0.2 %    Comment: Performed at [facility], [place] [address], [place], [place] [address]  Comprehensive metabolic  panel     Status: Abnormal   Collection Time: 06/21/2020  5:19 AM  Result Value Ref Range   Sodium 148 (H) 135 - 145 mmol/L   Potassium 3.6 3.5 - 5.1 mmol/L   Chloride 115 (H) 98 - 111 mmol/L   CO2 17 (L) 22 - 32 mmol/L   Glucose, Bld 130 (H) 70 - 99 mg/dL    Comment: Glucose reference range applies only to samples taken after fasting for at least 8 hours.   BUN 85 (H) 6 - 20 mg/dL   Creatinine, Ser 2.77 (H) 0.44 - 1.00 mg/dL   Calcium 7.2 (L) 8.9 - 10.3 mg/dL   Total Protein 4.2 (L) 6.5 - 8.1 g/dL   Albumin 1.4 (L) 3.5 - 5.0 g/dL   AST 21 15 - 41 U/L   ALT 22 0 - 44 U/L   Alkaline Phosphatase 54 38 - 126 U/L   Total Bilirubin 1.2 0.3  - 1.2 mg/dL   GFR calc non Af Amer 18 (L) >60 mL/min   GFR calc Af Amer 21 (L) >60 mL/min   Anion gap 16 (H) 5 - 15    Comment: Performed at [facility], [place] [address], [place], [place] [address]  Prepare fresh frozen plasma     Status: None   Collection Time: 06/16/2020  7:34 AM  Result Value Ref Range   Unit Number [id number]    Blood Component Type THAWED PLASMA    Unit division 00    Status of Unit ISSUED,FINAL    Transfusion Status OK TO TRANSFUSE    Unit Number [id number]    Blood Component Type THAWED PLASMA    Unit division 00    Status of Unit ISSUED,FINAL    Transfusion Status      OK TO TRANSFUSE Performed at [facility], [place] [address], [place], [place] [address]   Glucose, capillary     Status: Abnormal   Collection Time: 06/14/2020  7:38 AM  Result Value Ref Range   Glucose-Capillary 108 (H) 70 - 99 mg/dL    Comment: Glucose reference range applies only to samples taken after fasting for at least 8 hours.  Lactic acid, plasma     Status: Abnormal   Collection Time: 06/21/2020  8:45 AM  Result Value Ref Range   Lactic Acid, Venous 5.5 (HH) 0.5 - 1.9 mmol/L    Comment: CRITICAL VALUE NOTED.  VALUE IS CONSISTENT WITH PREVIOUSLY REPORTED AND CALLED VALUE. Performed at [facility], [place] [address], [place], [place] [address]   Protime-INR     Status: Abnormal   Collection Time: 06/20/2020  8:45 AM  Result Value Ref Range   Prothrombin Time 19.9 (H) 11.4 - 15.2 seconds   INR 1.8 (H) 0.8 - 1.2    Comment: (NOTE) INR goal varies based on device and disease states. Performed at [facility], [place] [address], [place], [place] [address]   Type and screen Gu-Win     Status: None   Collection Time: 05/26/2020  9:10 AM  Result Value Ref Range   ABO/RH(D) O POS    Antibody Screen NEG    Sample Expiration 17-Jul-2020,2359    Unit Number [id number]    Blood Component Type RED CELLS,LR    Unit division 00    Status of Unit  ISSUED,FINAL    Transfusion Status OK TO TRANSFUSE    Crossmatch Result Compatible    Unit Number [id number]  Blood Component Type RED CELLS,LR    Unit division 00    Status of Unit ISSUED,FINAL    Transfusion Status OK TO TRANSFUSE    Crossmatch Result Compatible    Unit Number [id number]    Blood Component Type RED CELLS,LR    Unit division 00    Status of Unit ISSUED,FINAL    Transfusion Status OK TO TRANSFUSE    Crossmatch Result      Compatible Performed at [facility], [place] [address] [place], [place] [address]   I-STAT 7, (LYTES, BLD GAS, ICA, H+H)     Status: Abnormal   Collection Time: 05/25/2020  9:38 AM  Result Value Ref Range   pH, Arterial 7.414 7.35 - 7.45   pCO2 arterial 25.8 (L) 32 - 48 mmHg   pO2, Arterial 233 (H) 83 - 108 mmHg   Bicarbonate 16.6 (L) 20.0 - 28.0 mmol/L   TCO2 17 (L) 22 - 32 mmol/L   O2 Saturation 100.0 %   Acid-base deficit 7.0 (H) 0.0 - 2.0 mmol/L   Sodium 149 (H) 135 - 145 mmol/L   Potassium 3.1 (L) 3.5 - 5.1 mmol/L   Calcium, Ion 0.97 (L) 1.15 - 1.40 mmol/L   HCT 33.0 (L) 36 - 46 %   Hemoglobin 11.2 (L) 12.0 - 15.0 g/dL   Patient temperature 97.6 F    Collection site Radial    Drawn by RT    Sample type ARTERIAL   Basic metabolic panel     Status: Abnormal   Collection Time: 06/07/2020 11:00 AM  Result Value Ref Range   Sodium 149 (H) 135 - 145 mmol/L   Potassium 3.1 (L) 3.5 - 5.1 mmol/L   Chloride 113 (H) 98 - 111 mmol/L   CO2 17 (L) 22 - 32 mmol/L   Glucose, Bld 180 (H) 70 - 99 mg/dL    Comment: Glucose reference range applies only to samples taken after fasting for at least 8 hours.   BUN 79 (H) 6 - 20 mg/dL   Creatinine, Ser 2.58 (H) 0.44 - 1.00 mg/dL   Calcium 6.9 (L) 8.9 - 10.3 mg/dL   GFR calc non Af Amer 20 (L) >60 mL/min   GFR calc Af Amer 23 (L) >60 mL/min   Anion gap 19 (H) 5 - 15    Comment: Performed at [facility] [address], [place], [place] [address]  Magnesium     Status: None    Collection Time: 05/27/2020 11:00 AM  Result Value Ref Range   Magnesium 2.4 1.7 - 2.4 mg/dL    Comment: Performed at [facility], [place] [address], [place], [place] [address]  Phosphorus     Status: None   Collection Time: 06/13/2020 11:00 AM  Result Value Ref Range   Phosphorus 3.5 2.5 - 4.6 mg/dL    Comment: Performed at [facility] [address] [place], [place] [address]  Lactic acid, plasma     Status: Abnormal   Collection Time: 06/08/2020 11:00 AM  Result Value Ref Range   Lactic Acid, Venous 5.6 (HH) 0.5 - 1.9 mmol/L    Comment: CRITICAL VALUE NOTED.  VALUE IS CONSISTENT WITH PREVIOUSLY REPORTED AND CALLED VALUE. Performed at [facility], [place] [address], [place], [place] [address]   Surgical pcr screen     Status: Abnormal   Collection Time: 06/09/2020 11:08 AM   Specimen: Nasal Mucosa; Nasal Swab  Result Value Ref Range  MRSA, PCR NEGATIVE NEGATIVE   Staphylococcus aureus POSITIVE (A) NEGATIVE    Comment: (NOTE) The Xpert SA Assay (FDA approved for NASAL specimens in patients 55 years of age and older), is one component of a comprehensive surveillance program. It is not intended to diagnose infection nor to guide or monitor treatment. Performed at [facility], [place] [address] [place], [place] [address]   Glucose, capillary     Status: Abnormal   Collection Time: 06/16/2020 11:26 AM  Result Value Ref Range   Glucose-Capillary 121 (H) 70 - 99 mg/dL    Comment: Glucose reference range applies only to samples taken after fasting for at least 8 hours.  Cytology - Non PAP;     Status: None   Collection Time: 05/29/2020  2:17 PM  Result Value Ref Range   CYTOLOGY - NON GYN      CYTOLOGY - NON PAP CASE: [id number] PATIENT: Kari [name] Non-Gynecological Cytology Report     Clinical History: sepsis of unknown orgin Specimen Submitted:  A. ASCITES, PARACENTESIS:   FINAL MICROSCOPIC DIAGNOSIS: - No malignant cells identified  SPECIMEN  ADEQUACY: Satisfactory for evaluation  DIAGNOSTIC COMMENTS: Mixed acute and chronic inflammation is present.  GROSS: Received is/are: 30cc's of dark orange fluid. ([id number]) Smears: 0 Concentration Method (ThinPrep): 1 Cell Block: 1 Conventional. Additional Studies: N/A     Final Diagnosis performed by Thressa Sheller, MD.   Electronically signed 06/16/2020 Technical and / or Professional components performed at [facility]. [facility], [place] [address] [place], [place] [address].  Immunohistochemistry Technical component (if applicable) was performed at [facility]. [address] [place], [place], [place] [address].   IMMUNOHISTOCHEMISTRY DISCLAIMER (if applicable): Some  of these immunohistochemical stains may have been developed and the performance characteristics determine by [facility]. Some may not have been cleared or approved by the U.S. Food and Drug Administration. The FDA has determined that such clearance or approval is not necessary. This test is used for clinical purposes. It should not be regarded as investigational or for research. This laboratory is certified under the [place] (CLIA-88) as qualified to perform high complexity clinical laboratory testing.  The controls stained appropriately.   I-STAT 7, (LYTES, BLD GAS, ICA, H+H)     Status: Abnormal   Collection Time: 06/19/2020  2:18 PM  Result Value Ref Range   pH, Arterial 7.413 7.35 - 7.45   pCO2 arterial 23.5 (L) 32 - 48 mmHg   pO2, Arterial 264 (H) 83 - 108 mmHg   Bicarbonate 15.0 (L) 20.0 - 28.0 mmol/L   TCO2 16 (L) 22 - 32 mmol/L   O2 Saturation 100.0 %   Acid-base deficit 8.0 (H) 0.0 - 2.0 mmol/L   Sodium 150 (H) 135 - 145 mmol/L   Potassium 2.9 (L) 3.5 - 5.1 mmol/L   Calcium, Ion 0.92 (L) 1.15 - 1.40 mmol/L   HCT 27.0 (L) 36 - 46 %   Hemoglobin 9.2 (L) 12.0 - 15.0 g/dL   Sample type ARTERIAL   Prepare RBC (crossmatch)      Status: None   Collection Time: 05/25/2020  2:44 PM  Result Value Ref Range   Order Confirmation      ORDER PROCESSED BY BLOOD BANK Performed at [facility], [address] [place], [place] [address]   Prepare RBC (crossmatch)     Status: None   Collection Time: 06/20/2020  5:30 PM  Result Value Ref [place]  Order Confirmation      ORDER PROCESSED BY BLOOD BANK Performed at [facility], [place] [address], [place], [place] [address]   I-STAT 7, (LYTES, BLD GAS, ICA, H+H)     Status: Abnormal   Collection Time: 05/24/2020  6:45 PM  Result Value Ref [place]   pH, Arterial 7.427 7.35 - 7.45   pCO2 arterial 25.8 (L) 32 - 48 mmHg   pO2, Arterial 125 (H) 83 - 108 mmHg   Bicarbonate 17.0 (L) 20.0 - 28.0 mmol/L   TCO2 18 (L) 22 - 32 mmol/L   O2 Saturation 99.0 %   Acid-base deficit 6.0 (H) 0.0 - 2.0 mmol/L   Sodium 148 (H) 135 - 145 mmol/L   Potassium 3.6 3.5 - 5.1 mmol/L   Calcium, Ion 1.01 (L) 1.15 - 1.40 mmol/L   HCT 43.0 36 - 46 %   Hemoglobin 14.6 12.0 - 15.0 g/dL   Patient temperature 98.6 F    Collection site Radial    Drawn by RT    Sample type ARTERIAL   Comprehensive metabolic panel     Status: Abnormal   Collection Time: 06/06/2020  6:57 PM  Result Value Ref Range   Sodium 149 (H) 135 - 145 mmol/L   Potassium 3.8 3.5 - 5.1 mmol/L   Chloride 113 (H) 98 - 111 mmol/L   CO2 16 (L) 22 - 32 mmol/L   Glucose, Bld 203 (H) 70 - 99 mg/dL    Comment: Glucose reference range applies only to samples taken after fasting for at least 8 hours.   BUN 79 (H) 6 - 20 mg/dL   Creatinine, Ser 2.71 (H) 0.44 - 1.00 mg/dL   Calcium 7.2 (L) 8.9 - 10.3 mg/dL   Total Protein 4.0 (L) 6.5 - 8.1 g/dL   Albumin 1.7 (L) 3.5 - 5.0 g/dL   AST 20 15 - 41 U/L   ALT 18 0 - 44 U/L   Alkaline Phosphatase 48 38 - 126 U/L   Total Bilirubin 1.7 (H) 0.3 - 1.2 mg/dL   GFR calc non Af Amer 19 (L) >60 mL/min   GFR calc Af Amer 22 (L) >60 mL/min   Anion gap 20 (H) 5 - 15    Comment: Performed at [facility], [place] [address], [place], [place] [address]  CBC     Status: Abnormal   Collection Time: 06/12/2020  6:57 PM  Result Value Ref Range   WBC 11.3 (H) 4.0 - 10.5 K/uL   RBC 4.72 3.87 - 5.11 MIL/uL   Hemoglobin 14.3 12.0 - 15.0 g/dL    Comment: REPEATED TO VERIFY POST TRANSFUSION SPECIMEN    HCT 41.6 36 - 46 %   MCV 88.1 80.0 - 100.0 fL   MCH 30.3 26.0 - 34.0 pg   MCHC 34.4 30.0 - 36.0 g/dL   RDW 16.1 (H) 11.5 - 15.5 %   Platelets 91 (L) 150 - 400 K/uL    Comment: SPECIMEN CHECKED FOR CLOTS Immature Platelet Fraction may be clinically indicated, consider ordering this additional test [id number] PLATELET COUNT CONFIRMED BY SMEAR REPEATED TO VERIFY    nRBC 0.5 (H) 0.0 - 0.2 %    Comment: Performed at [facility], [place] [address], [place], [place] [address]  Magnesium     Status: Abnormal   Collection Time: 06/14/2020  6:57 PM  Result Value Ref Range   Magnesium 2.7 (H) 1.7 - 2.4 mg/dL    Comment: Performed at [facility]  [facility], [place] [address] [place], [place] [address]  Phosphorus     Status: None   Collection Time: 06/04/2020  6:57 PM  Result Value Ref Range   Phosphorus 3.8 2.5 - 4.6 mg/dL    Comment: Performed at [facility], [place] [address] [place], [place] [address]  Protime-INR     Status: Abnormal   Collection Time: 06/13/2020  6:57 PM  Result Value Ref Range   Prothrombin Time 19.8 (H) 11.4 - 15.2 seconds   INR 1.7 (H) 0.8 - 1.2    Comment: (NOTE) INR goal varies based on device and disease states. Performed at [facility], [place] [address] [place], [place] [address]   Lactic acid, plasma     Status: Abnormal   Collection Time: 05/25/2020  6:57 PM  Result Value Ref Range   Lactic Acid, Venous 6.8 (HH) 0.5 - 1.9 mmol/L    Comment: CRITICAL VALUE NOTED.  VALUE IS CONSISTENT WITH PREVIOUSLY REPORTED AND CALLED VALUE. Performed at [facility], [place] [address], [place], [place] [address]   Glucose, capillary     Status: Abnormal    Collection Time: 05/30/2020  7:23 PM  Result Value Ref Range   Glucose-Capillary 179 (H) 70 - 99 mg/dL    Comment: Glucose reference range applies only to samples taken after fasting for at least 8 hours.  CBC     Status: Abnormal   Collection Time: 06/11/2020 10:03 PM  Result Value Ref Range   WBC 13.8 (H) 4.0 - 10.5 K/uL   RBC 4.99 3.87 - 5.11 MIL/uL   Hemoglobin 14.9 12.0 - 15.0 g/dL   HCT 44.2 36 - 46 %   MCV 88.6 80.0 - 100.0 fL   MCH 29.9 26.0 - 34.0 pg   MCHC 33.7 30.0 - 36.0 g/dL   RDW 16.4 (H) 11.5 - 15.5 %   Platelets 82 (L) 150 - 400 K/uL    Comment: Immature Platelet Fraction may be clinically indicated, consider ordering this additional test [id number] REPEATED TO VERIFY    nRBC 0.3 (H) 0.0 - 0.2 %    Comment: Performed at [facility], [place] [address], [place], [place] [address]  Basic metabolic panel     Status: Abnormal   Collection Time: 06/11/2020 10:15 PM  Result Value Ref Range   Sodium 150 (H) 135 - 145 mmol/L   Potassium 3.6 3.5 - 5.1 mmol/L   Chloride 111 98 - 111 mmol/L   CO2 19 (L) 22 - 32 mmol/L   Glucose, Bld 218 (H) 70 - 99 mg/dL    Comment: Glucose reference range applies only to samples taken after fasting for at least 8 hours.   BUN 78 (H) 6 - 20 mg/dL   Creatinine, Ser 2.80 (H) 0.44 - 1.00 mg/dL   Calcium 7.2 (L) 8.9 - 10.3 mg/dL   GFR calc non Af Amer 18 (L) >60 mL/min   GFR calc Af Amer 21 (L) >60 mL/min   Anion gap 20 (H) 5 - 15    Comment: Performed at [place] [address], [place], [place] [address]  Glucose, capillary     Status: Abnormal   Collection Time: 05/31/2020 11:21 PM  Result Value Ref Range   Glucose-Capillary 183 (H) 70 - 99 mg/dL    Comment: Glucose reference range applies only to samples taken after fasting for at least 8 hours.  Lactic acid, plasma     Status: Abnormal  Collection Time: 05/30/2020 11:35 PM  Result Value Ref Range   Lactic Acid, Venous 7.1 (HH) 0.5 - 1.9 mmol/L    Comment: CRITICAL VALUE  NOTED.  VALUE IS CONSISTENT WITH PREVIOUSLY REPORTED AND CALLED VALUE. Performed at [facility], [place] [address] [place], [place] [address]   Lactic acid, plasma     Status: Abnormal   Collection Time: 06/16/20  2:31 AM  Result Value Ref Range   Lactic Acid, Venous 8.6 (HH) 0.5 - 1.9 mmol/L    Comment: CRITICAL VALUE NOTED.  VALUE IS CONSISTENT WITH PREVIOUSLY REPORTED AND CALLED VALUE. Performed at [facility], [place] [address] [place], [place] [address]   Glucose, capillary     Status: Abnormal   Collection Time: 06/16/20  3:14 AM  Result Value Ref Range   Glucose-Capillary 135 (H) 70 - 99 mg/dL    Comment: Glucose reference range applies only to samples taken after fasting for at least 8 hours.  CBC     Status: Abnormal   Collection Time: 06/16/20  5:32 AM  Result Value Ref Range   WBC 21.0 (H) 4.0 - 10.5 K/uL   RBC 4.79 3.87 - 5.11 MIL/uL   Hemoglobin 14.5 12.0 - 15.0 g/dL   HCT 42.7 36 - 46 %   MCV 89.1 80.0 - 100.0 fL   MCH 30.3 26.0 - 34.0 pg   MCHC 34.0 30.0 - 36.0 g/dL   RDW 16.6 (H) 11.5 - 15.5 %   Platelets 77 (L) 150 - 400 K/uL    Comment: Immature Platelet Fraction may be clinically indicated, consider ordering this additional test [id number] REPEATED TO VERIFY    nRBC 0.2 0.0 - 0.2 %    Comment: Performed at [facility], [place] [address] [place], [place] [address]  Lactic acid, plasma     Status: Abnormal   Collection Time: 06/16/20  5:32 AM  Result Value Ref Range   Lactic Acid, Venous 9.0 (HH) 0.5 - 1.9 mmol/L    Comment: CRITICAL VALUE NOTED.  VALUE IS CONSISTENT WITH PREVIOUSLY REPORTED AND CALLED VALUE. Performed at [facility], [place] [address], [place], [place] [address]   Glucose, capillary     Status: Abnormal   Collection Time: 06/16/20  7:41 AM  Result Value Ref Range   Glucose-Capillary 132 (H) 70 - 99 mg/dL    Comment: Glucose reference range applies only to samples taken after fasting for at least 8 hours.  Lactic acid,  plasma     Status: Abnormal   Collection Time: 06/16/20  9:51 AM  Result Value Ref Range   Lactic Acid, Venous 6.1 (HH) 0.5 - 1.9 mmol/L    Comment: CRITICAL VALUE NOTED.  VALUE IS CONSISTENT WITH PREVIOUSLY REPORTED AND CALLED VALUE. Performed at [facility], [place] [address] [place], [place] [address]   Phosphorus     Status: Abnormal   Collection Time: 06/16/20 10:00 AM  Result Value Ref Range   Phosphorus 1.7 (L) 2.5 - 4.6 mg/dL    Comment: Performed at [place] [address] [place], [place] [address]  Magnesium     Status: Abnormal   Collection Time: 06/16/20 10:00 AM  Result Value Ref Range   Magnesium 1.2 (L) 1.7 - 2.4 mg/dL    Comment: Performed at [place] [address] [place], [place] [address]  Glucose, capillary     Status: Abnormal   Collection Time: 06/16/20 11:11 AM  Result Value Ref Range   Glucose-Capillary 116 (H) 70 - 99 mg/dL    Comment: Glucose reference range applies only to samples taken after fasting for at least 8 hours.  Lactic acid, plasma     Status: Abnormal   Collection Time: 06/16/20  1:21 PM  Result Value Ref Range   Lactic Acid, Venous >11.0 (HH) 0.5 - 1.9 mmol/L    Comment: CRITICAL VALUE NOTED.  VALUE IS CONSISTENT WITH PREVIOUSLY REPORTED AND CALLED VALUE. Performed at [facility], [place] [address] [place], [place] [address]   Glucose, capillary     Status: Abnormal   Collection Time: 06/16/20  3:37 PM  Result Value Ref Range   Glucose-Capillary 175 (H) 70 - 99 mg/dL    Comment: Glucose reference range applies only to samples taken after fasting for at least 8 hours.       Assessment and Plan :Kari [name] is a 59 y.o. admitted for sepsis secondary to peritonitis from SBO with perforation. Wound with vac in place, plan by gen surg to potentially go back to OR tomorrow for reanastomosis however per RN, patient requiring significant support and may not be stable. Gyn consulted for possible removal of  large ovarian mass.  Review of chart shows mass has been present since at least 03/2017. No abdominal imaging prior to that available. It has decreased in size (from 31 cm in 2018 to 20 cm now).  Given the significant size of the ovarian cyst, recommend removal of ovary. Given her age, recommend bilateral salpingo-oophorectomy for prevention of future ovarian cancer.   Discussed the above with her daughter via telephone at 4:15 pm on 06/16/20. Reviewed risks/benefits and daughter is agreeable for [place] to proceed with bilateral salpingo-oophorectomy at the time of repair by general surgery. Consent witnessed by Dr. Barrington Ellison who signed consent for.    Will proceed with bilateral salpingo-oophorectomy at the time of repair by general surgery.  Thank you for this consult, we will follow along, please call or re-consult with further questions.   For OB/GYN questions, please call the [facility] [place] Monday - Friday, 8 am - 5 pm: [phone] All other times: [phone]    K. Arvilla Meres, M.D. Attending [place], [facility] for [employer], [place]

## 2020-06-16 NOTE — Progress Notes (Signed)
DAILY PROGRESS NOTE   Patient Name: Kari [name] Date of Encounter: 06/16/2020 Cardiologist: Dorris Carnes, MD  Chief Complaint   Intubated, sedated on vent  Patient Profile   Kari [name] is a 59 y.o. 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  Subjective   Continues to require pressors and had significant acidemia - on bicarb gtts. Persistent PVC's - some NSVT overnight, but improved this am.  Objective   Vitals:   06/16/20 0645 06/16/20 0700 06/16/20 0720 06/16/20 0743  BP: 91/75 (!) 78/64 (!) 75/56   Pulse:   (!) 104   Resp: (!) 27 (!) 28 (!) 29   Temp:    (!) 101.1 F (38.4 C)  TempSrc:    Core  SpO2:   100%   Weight:      Height:        Intake/Output Summary (Last 24 hours) at 06/16/2020 3094 Last data filed at 06/16/2020 0600 Gross per 24 hour  Intake 8668.66 ml  Output 2770 ml  Net 5898.66 ml   Filed Weights   06/05/2020 1316 05/25/2020 2320 06/11/20 1430  Weight: 102.1 kg 107.2 kg 110.3 kg    Physical Exam   General appearance: intubated, sedated on vent Neck: no carotid bruit, no JVD and thyroid not enlarged, symmetric, no tenderness/mass/nodules Lungs: diminished breath sounds bilaterally Heart: regular rate and rhythm and occasional ectopy Abdomen: surgical abdomen Extremities: extremities normal, atraumatic, no cyanosis or edema Pulses: 2+ and symmetric Skin: Skin color, texture, turgor normal. No rashes or lesions Neurologic: Mental status: intubated, sedated on vent Psych: Cannot assess  Inpatient Medications    Scheduled Meds: . allopurinol  50 mg Per Tube QODAY  . aspirin  81 mg Per Tube Daily  . chlorhexidine gluconate (MEDLINE KIT)  15 mL Mouth Rinse BID  . chlorhexidine gluconate (MEDLINE KIT)  15 mL Mouth Rinse BID  .  Chlorhexidine Gluconate Cloth  6 each Topical Daily  . mouth rinse  15 mL Mouth Rinse 10 times per day  . pantoprazole (PROTONIX) IV  40 mg Intravenous Q24H    Continuous Infusions: . sodium chloride Stopped (06/06/2020 1446)  . albumin human    . ceFEPime (MAXIPIME) IV    . fentaNYL infusion INTRAVENOUS    . metronidazole 500 mg (06/16/20 0819)  . norepinephrine (LEVOPHED) Adult infusion 30 mcg/min (06/16/20 0856)  . phenylephrine (NEO-SYNEPHRINE) Adult infusion 300 mcg/min (06/16/20 0600)  . potassium chloride 10 mEq (06/16/20 0839)  . sodium bicarbonate 150 mEq in dextrose 5% 1000 mL 150 mEq (06/16/20 0804)    PRN Meds: acetaminophen, albuterol, fentaNYL, fentaNYL (SUBLIMAZE) injection, fentaNYL (SUBLIMAZE) injection   Labs   Results for orders placed or performed during the hospital encounter of 06/06/2020 (from the past 48 hour(s))  CBC     Status: Abnormal   Collection Time: 06/14/20 10:49 AM  Result Value Ref Range   WBC 13.6 (H) 4.0 - 10.5 K/uL   RBC 3.71 (L) 3.87 - 5.11 MIL/uL   Hemoglobin 11.4 (L) 12.0 - 15.0 g/dL   HCT 34.8 (L) 36 - 46 %   MCV 93.8 80.0 - 100.0 fL   MCH 30.7 26.0 - 34.0 pg   MCHC 32.8 30.0 - 36.0 g/dL   RDW 17.7 (H) 11.5 - 15.5 %  Platelets 191 150 - 400 K/uL   nRBC 0.2 0.0 - 0.2 %    Comment: Performed at [facility], [place] [address], [place], [place] [address]  Comprehensive metabolic panel     Status: Abnormal   Collection Time: 06/14/20 10:49 AM  Result Value Ref Range   Sodium 148 (H) 135 - 145 mmol/L   Potassium 3.7 3.5 - 5.1 mmol/L   Chloride 118 (H) 98 - 111 mmol/L   CO2 16 (L) 22 - 32 mmol/L   Glucose, Bld 87 70 - 99 mg/dL    Comment: Glucose reference range applies only to samples taken after fasting for at least 8 hours.   BUN 82 (H) 6 - 20 mg/dL   Creatinine, Ser 2.33 (H) 0.44 - 1.00 mg/dL   Calcium 7.5 (L) 8.9 - 10.3 mg/dL   Total Protein 4.7 (L) 6.5 - 8.1 g/dL   Albumin 1.6 (L) 3.5 - 5.0 g/dL   AST 10 (L) 15 - 41 U/L    ALT 20 0 - 44 U/L   Alkaline Phosphatase 56 38 - 126 U/L   Total Bilirubin 1.4 (H) 0.3 - 1.2 mg/dL   GFR calc non Af Amer 22 (L) >60 mL/min   GFR calc Af Amer 26 (L) >60 mL/min   Anion gap 14 5 - 15    Comment: Performed at [facility] [address] [place], [place] [address]  Magnesium     Status: Abnormal   Collection Time: 06/14/20 10:49 AM  Result Value Ref Range   Magnesium 2.8 (H) 1.7 - 2.4 mg/dL    Comment: Performed at [facility] [address] [place], [place] [address]  Lactic acid, plasma     Status: None   Collection Time: 06/14/20 10:49 AM  Result Value Ref Range   Lactic Acid, Venous 1.2 0.5 - 1.9 mmol/L    Comment: Performed at [facility] [address] [place], [place] [address]  .Cooxemetry Panel (carboxy, met, total hgb, O2 sat)     Status: None   Collection Time: 06/14/20 10:49 AM  Result Value Ref Range   Total hemoglobin 12.0 12.0 - 16.0 g/dL   O2 Saturation 75.2 %   Carboxyhemoglobin 1.5 0.5 - 1.5 %   Methemoglobin 1.3 0.0 - 1.5 %    Comment: Performed at [facility], [place] [address] [place], [place] [address]  Basic metabolic panel     Status: Abnormal   Collection Time: 06/14/20  3:47 PM  Result Value Ref Range   Sodium 148 (H) 135 - 145 mmol/L   Potassium 3.8 3.5 - 5.1 mmol/L   Chloride 118 (H) 98 - 111 mmol/L   CO2 15 (L) 22 - 32 mmol/L   Glucose, Bld 83 70 - 99 mg/dL    Comment: Glucose reference range applies only to samples taken after fasting for at least 8 hours.   BUN 84 (H) 6 - 20 mg/dL   Creatinine, Ser 2.49 (H) 0.44 - 1.00 mg/dL   Calcium 7.6 (L) 8.9 - 10.3 mg/dL   GFR calc non Af Amer 21 (L) >60 mL/min   GFR calc Af Amer 24 (L) >60 mL/min   Anion gap 15 5 - 15    Comment: Performed at [facility] [address], [place], [place] [address]  Lipase, blood     Status: Abnormal   Collection Time: 06/14/20  3:47 PM  Result Value Ref Range  Lipase 162 (H) 11 - 51 U/L    Comment: Performed at [facility], [place] [address], [place], [place] [address]  Troponin I (High Sensitivity)     Status: None   Collection Time: 06/14/20  3:47 PM  Result Value Ref Range   Troponin I (High Sensitivity) 9 <18 ng/L    Comment: (NOTE) Elevated high sensitivity troponin I (hsTnI) values and significant  changes across serial measurements may suggest ACS but many other  chronic and acute conditions are known to elevate hsTnI results.  Refer to the "Links" section for chest pain algorithms and additional  guidance. Performed at [facility], [place] [address] [place], [place] [address]   Lactic acid, plasma     Status: None   Collection Time: 06/14/20  5:22 PM  Result Value Ref Range   Lactic Acid, Venous 1.5 0.5 - 1.9 mmol/L    Comment: Performed at [facility] [address], [place], [place] [address]  Troponin I (High Sensitivity)     Status: None   Collection Time: 06/14/20  6:12 PM  Result Value Ref Range   Troponin I (High Sensitivity) 9 <18 ng/L    Comment: (NOTE) Elevated high sensitivity troponin I (hsTnI) values and significant  changes across serial measurements may suggest ACS but many other  chronic and acute conditions are known to elevate hsTnI results.  Refer to the "Links" section for chest pain algorithms and additional  guidance. Performed at [facility], [place] [address], [place], [place] [address]   Protime-INR     Status: Abnormal   Collection Time: 06/14/20  6:12 PM  Result Value Ref Range   Prothrombin Time 19.2 (H) 11.4 - 15.2 seconds   INR 1.7 (H) 0.8 - 1.2    Comment: (NOTE) INR goal varies based on device and disease states. Performed at [facility], [place] [address], [place], [place] [address]   I-STAT 7, (LYTES, BLD GAS, ICA, H+H)     Status: Abnormal   Collection Time: 06/14/20  8:29 PM  Result Value Ref Range   pH, Arterial 7.409 7.35 - 7.45   pCO2 arterial 23.9 (L) 32 - 48 mmHg   pO2, Arterial 106 83 - 108 mmHg    Bicarbonate 15.1 (L) 20.0 - 28.0 mmol/L   TCO2 16 (L) 22 - 32 mmol/L   O2 Saturation 98.0 %   Acid-base deficit 8.0 (H) 0.0 - 2.0 mmol/L   Sodium 148 (H) 135 - 145 mmol/L   Potassium 3.8 3.5 - 5.1 mmol/L   Calcium, Ion 1.10 (L) 1.15 - 1.40 mmol/L   HCT 35.0 (L) 36 - 46 %   Hemoglobin 11.9 (L) 12.0 - 15.0 g/dL   Collection site Radial    Drawn by RT    Sample type ARTERIAL   Glucose, capillary     Status: Abnormal   Collection Time: 06/14/20  9:10 PM  Result Value Ref Range   Glucose-Capillary 59 (L) 70 - 99 mg/dL    Comment: Glucose reference range applies only to samples taken after fasting for at least 8 hours.  Glucose, capillary     Status: Abnormal   Collection Time: 06/14/20  9:30 PM  Result Value Ref Range   Glucose-Capillary 115 (H) 70 - 99 mg/dL    Comment: Glucose reference range applies only to samples taken after fasting for at least 8 hours.  Glucose, capillary     Status: None   Collection  Time: 06/14/20 11:06 PM  Result Value Ref Range   Glucose-Capillary 99 70 - 99 mg/dL    Comment: Glucose reference range applies only to samples taken after fasting for at least 8 hours.  Glucose, capillary     Status: None   Collection Time: 06/08/2020  3:14 AM  Result Value Ref Range   Glucose-Capillary 99 70 - 99 mg/dL    Comment: Glucose reference range applies only to samples taken after fasting for at least 8 hours.  Lactic acid, plasma     Status: Abnormal   Collection Time: 06/04/2020  5:19 AM  Result Value Ref Range   Lactic Acid, Venous 4.4 (HH) 0.5 - 1.9 mmol/L    Comment: CRITICAL RESULT CALLED TO, READ BACK BY AND VERIFIED WITH: NIX G,RN 06/14/2020 7939 WAYK Performed at [facility], [place] [address] [place], [place] [address]   CBC     Status: Abnormal   Collection Time: 06/14/2020  5:19 AM  Result Value Ref Range   WBC 17.2 (H) 4.0 - 10.5 K/uL   RBC 3.57 (L) 3.87 - 5.11 MIL/uL   Hemoglobin 11.0 (L) 12.0 - 15.0 g/dL   HCT 32.8 (L) 36 - 46 %   MCV 91.9 80.0 -  100.0 fL   MCH 30.8 26.0 - 34.0 pg   MCHC 33.5 30.0 - 36.0 g/dL   RDW 17.5 (H) 11.5 - 15.5 %   Platelets 162 150 - 400 K/uL   nRBC 0.4 (H) 0.0 - 0.2 %    Comment: Performed at [facility], [place] [address] [place], [place] [address]  Comprehensive metabolic panel     Status: Abnormal   Collection Time: 05/30/2020  5:19 AM  Result Value Ref Range   Sodium 148 (H) 135 - 145 mmol/L   Potassium 3.6 3.5 - 5.1 mmol/L   Chloride 115 (H) 98 - 111 mmol/L   CO2 17 (L) 22 - 32 mmol/L   Glucose, Bld 130 (H) 70 - 99 mg/dL    Comment: Glucose reference range applies only to samples taken after fasting for at least 8 hours.   BUN 85 (H) 6 - 20 mg/dL   Creatinine, Ser 2.77 (H) 0.44 - 1.00 mg/dL   Calcium 7.2 (L) 8.9 - 10.3 mg/dL   Total Protein 4.2 (L) 6.5 - 8.1 g/dL   Albumin 1.4 (L) 3.5 - 5.0 g/dL   AST 21 15 - 41 U/L   ALT 22 0 - 44 U/L   Alkaline Phosphatase 54 38 - 126 U/L   Total Bilirubin 1.2 0.3 - 1.2 mg/dL   GFR calc non Af Amer 18 (L) >60 mL/min   GFR calc Af Amer 21 (L) >60 mL/min   Anion gap 16 (H) 5 - 15    Comment: Performed at [facility], [place] [address], [place], [place] [address]  Prepare fresh frozen plasma     Status: None   Collection Time: 06/02/2020  7:34 AM  Result Value Ref Range   Unit Number [id number]    Blood Component Type THAWED PLASMA    Unit division 00    Status of Unit ISSUED,FINAL    Transfusion Status OK TO TRANSFUSE    Unit Number [id number]    Blood Component Type THAWED PLASMA    Unit division 00    Status of Unit ISSUED,FINAL    Transfusion Status      OK TO TRANSFUSE Performed at [facility], [place] [address],  [place], [place] [address]   Glucose, capillary     Status: Abnormal   Collection Time: 06/19/2020  7:38 AM  Result Value Ref Range   Glucose-Capillary 108 (H) 70 - 99 mg/dL    Comment: Glucose reference range applies only to samples taken after fasting for at least 8 hours.  Lactic acid, plasma     Status: Abnormal    Collection Time: 05/30/2020  8:45 AM  Result Value Ref Range   Lactic Acid, Venous 5.5 (HH) 0.5 - 1.9 mmol/L    Comment: CRITICAL VALUE NOTED.  VALUE IS CONSISTENT WITH PREVIOUSLY REPORTED AND CALLED VALUE. Performed at [facility], [place] [address], [place], [place] [address]   Protime-INR     Status: Abnormal   Collection Time: 06/08/2020  8:45 AM  Result Value Ref Range   Prothrombin Time 19.9 (H) 11.4 - 15.2 seconds   INR 1.8 (H) 0.8 - 1.2    Comment: (NOTE) INR goal varies based on device and disease states. Performed at [facility], [place] [address] [place], [place] [address]   Type and screen [place]     Status: None   Collection Time: 06/04/2020  9:10 AM  Result Value Ref Range   ABO/RH(D) O POS    Antibody Screen NEG    Sample Expiration 07-15-2020,2359    Unit Number [id number]    Blood Component Type RED CELLS,LR    Unit division 00    Status of Unit ISSUED,FINAL    Transfusion Status OK TO TRANSFUSE    Crossmatch Result Compatible    Unit Number [id number]    Blood Component Type RED CELLS,LR    Unit division 00    Status of Unit ISSUED,FINAL    Transfusion Status OK TO TRANSFUSE    Crossmatch Result Compatible    Unit Number [id number]    Blood Component Type RED CELLS,LR    Unit division 00    Status of Unit ISSUED,FINAL    Transfusion Status OK TO TRANSFUSE    Crossmatch Result      Compatible Performed at [facility], [place] [address], [place], [place] [address]   I-STAT 7, (LYTES, BLD GAS, ICA, H+H)     Status: Abnormal   Collection Time: 06/05/2020  9:38 AM  Result Value Ref Range   pH, Arterial 7.414 7.35 - 7.45   pCO2 arterial 25.8 (L) 32 - 48 mmHg   pO2, Arterial 233 (H) 83 - 108 mmHg   Bicarbonate 16.6 (L) 20.0 - 28.0 mmol/L   TCO2 17 (L) 22 - 32 mmol/L   O2 Saturation 100.0 %   Acid-base deficit 7.0 (H) 0.0 - 2.0 mmol/L   Sodium 149 (H) 135 - 145 mmol/L   Potassium 3.1 (L) 3.5 - 5.1 mmol/L   Calcium,  Ion 0.97 (L) 1.15 - 1.40 mmol/L   HCT 33.0 (L) 36 - 46 %   Hemoglobin 11.2 (L) 12.0 - 15.0 g/dL   Patient temperature 97.6 F    Collection site Radial    Drawn by RT    Sample type ARTERIAL   Basic metabolic panel     Status: Abnormal   Collection Time: 05/27/2020 11:00 AM  Result Value Ref Range   Sodium 149 (H) 135 - 145 mmol/L   Potassium 3.1 (L) 3.5 - 5.1 mmol/L   Chloride 113 (H) 98 - 111 mmol/L   CO2 17 (L) 22 - 32 mmol/L   Glucose, Bld 180 (H) 70 -  99 mg/dL    Comment: Glucose reference range applies only to samples taken after fasting for at least 8 hours.   BUN 79 (H) 6 - 20 mg/dL   Creatinine, Ser 2.58 (H) 0.44 - 1.00 mg/dL   Calcium 6.9 (L) 8.9 - 10.3 mg/dL   GFR calc non Af Amer 20 (L) >60 mL/min   GFR calc Af Amer 23 (L) >60 mL/min   Anion gap 19 (H) 5 - 15    Comment: Performed at [facility] [address], [place], [place] [address]  Magnesium     Status: None   Collection Time: 06/09/2020 11:00 AM  Result Value Ref Range   Magnesium 2.4 1.7 - 2.4 mg/dL    Comment: Performed at [facility], [place] [address], [place], [place] [address]  Phosphorus     Status: None   Collection Time: 06/05/2020 11:00 AM  Result Value Ref Range   Phosphorus 3.5 2.5 - 4.6 mg/dL    Comment: Performed at [facility] [address], [place], [place] [address]  Lactic acid, plasma     Status: Abnormal   Collection Time: 06/14/2020 11:00 AM  Result Value Ref Range   Lactic Acid, Venous 5.6 (HH) 0.5 - 1.9 mmol/L    Comment: CRITICAL VALUE NOTED.  VALUE IS CONSISTENT WITH PREVIOUSLY REPORTED AND CALLED VALUE. Performed at [facility], [place] [address], [place], [place] [address]   Surgical pcr screen     Status: Abnormal   Collection Time: 05/27/2020 11:08 AM   Specimen: Nasal Mucosa; Nasal Swab  Result Value Ref Range   MRSA, PCR NEGATIVE NEGATIVE   Staphylococcus aureus POSITIVE (A) NEGATIVE    Comment: (NOTE) The Xpert SA Assay (FDA approved for NASAL specimens  in patients 61 years of age and older), is one component of a comprehensive surveillance program. It is not intended to diagnose infection nor to guide or monitor treatment. Performed at [facility], [place] [address], [place], [place] [address]   Glucose, capillary     Status: Abnormal   Collection Time: 06/07/2020 11:26 AM  Result Value Ref Range   Glucose-Capillary 121 (H) 70 - 99 mg/dL    Comment: Glucose reference range applies only to samples taken after fasting for at least 8 hours.  I-STAT 7, (LYTES, BLD GAS, ICA, H+H)     Status: Abnormal   Collection Time: 06/09/2020  2:18 PM  Result Value Ref Range   pH, Arterial 7.413 7.35 - 7.45   pCO2 arterial 23.5 (L) 32 - 48 mmHg   pO2, Arterial 264 (H) 83 - 108 mmHg   Bicarbonate 15.0 (L) 20.0 - 28.0 mmol/L   TCO2 16 (L) 22 - 32 mmol/L   O2 Saturation 100.0 %   Acid-base deficit 8.0 (H) 0.0 - 2.0 mmol/L   Sodium 150 (H) 135 - 145 mmol/L   Potassium 2.9 (L) 3.5 - 5.1 mmol/L   Calcium, Ion 0.92 (L) 1.15 - 1.40 mmol/L   HCT 27.0 (L) 36 - 46 %   Hemoglobin 9.2 (L) 12.0 - 15.0 g/dL   Sample type ARTERIAL   Prepare RBC (crossmatch)     Status: None   Collection Time: 05/30/2020  2:44 PM  Result Value Ref Range   Order Confirmation      ORDER PROCESSED BY BLOOD BANK Performed at [facility], [address], [place], [place] [address]   Prepare RBC (crossmatch)     Status:  None   Collection Time: 05/31/2020  5:30 PM  Result Value Ref Range   Order Confirmation      ORDER PROCESSED BY BLOOD BANK Performed at [facility], [place] [address], [place], [place] [address]   I-STAT 7, (LYTES, BLD GAS, ICA, H+H)     Status: Abnormal   Collection Time: 06/01/2020  6:45 PM  Result Value Ref Range   pH, Arterial 7.427 7.35 - 7.45   pCO2 arterial 25.8 (L) 32 - 48 mmHg   pO2, Arterial 125 (H) 83 - 108 mmHg   Bicarbonate 17.0 (L) 20.0 - 28.0 mmol/L   TCO2 18 (L) 22 - 32 mmol/L   O2 Saturation 99.0 %   Acid-base deficit 6.0 (H) 0.0 -  2.0 mmol/L   Sodium 148 (H) 135 - 145 mmol/L   Potassium 3.6 3.5 - 5.1 mmol/L   Calcium, Ion 1.01 (L) 1.15 - 1.40 mmol/L   HCT 43.0 36 - 46 %   Hemoglobin 14.6 12.0 - 15.0 g/dL   Patient temperature 98.6 F    Collection site Radial    Drawn by RT    Sample type ARTERIAL   Comprehensive metabolic panel     Status: Abnormal   Collection Time: 06/02/2020  6:57 PM  Result Value Ref Range   Sodium 149 (H) 135 - 145 mmol/L   Potassium 3.8 3.5 - 5.1 mmol/L   Chloride 113 (H) 98 - 111 mmol/L   CO2 16 (L) 22 - 32 mmol/L   Glucose, Bld 203 (H) 70 - 99 mg/dL    Comment: Glucose reference range applies only to samples taken after fasting for at least 8 hours.   BUN 79 (H) 6 - 20 mg/dL   Creatinine, Ser 2.71 (H) 0.44 - 1.00 mg/dL   Calcium 7.2 (L) 8.9 - 10.3 mg/dL   Total Protein 4.0 (L) 6.5 - 8.1 g/dL   Albumin 1.7 (L) 3.5 - 5.0 g/dL   AST 20 15 - 41 U/L   ALT 18 0 - 44 U/L   Alkaline Phosphatase 48 38 - 126 U/L   Total Bilirubin 1.7 (H) 0.3 - 1.2 mg/dL   GFR calc non Af Amer 19 (L) >60 mL/min   GFR calc Af Amer 22 (L) >60 mL/min   Anion gap 20 (H) 5 - 15    Comment: Performed at [facility], [place] [address] [place], [place] [address]  CBC     Status: Abnormal   Collection Time: 06/14/2020  6:57 PM  Result Value Ref Range   WBC 11.3 (H) 4.0 - 10.5 K/uL   RBC 4.72 3.87 - 5.11 MIL/uL   Hemoglobin 14.3 12.0 - 15.0 g/dL    Comment: REPEATED TO VERIFY POST TRANSFUSION SPECIMEN    HCT 41.6 36 - 46 %   MCV 88.1 80.0 - 100.0 fL   MCH 30.3 26.0 - 34.0 pg   MCHC 34.4 30.0 - 36.0 g/dL   RDW 16.1 (H) 11.5 - 15.5 %   Platelets 91 (L) 150 - 400 K/uL    Comment: SPECIMEN CHECKED FOR CLOTS Immature Platelet Fraction may be clinically indicated, consider ordering this additional test [id number] PLATELET COUNT CONFIRMED BY SMEAR REPEATED TO VERIFY    nRBC 0.5 (H) 0.0 - 0.2 %    Comment: Performed at [facility], [place] [address], [place], [place] [address]  Magnesium     Status:  Abnormal   Collection Time: 05/26/2020  6:57 PM  Result Value Ref  Range   Magnesium 2.7 (H) 1.7 - 2.4 mg/dL    Comment: Performed at [facility] [address] [place], [place] [address]  Phosphorus     Status: None   Collection Time: 06/03/2020  6:57 PM  Result Value Ref Range   Phosphorus 3.8 2.5 - 4.6 mg/dL    Comment: Performed at [facility], [place] [address], [place], [place] [address]  Protime-INR     Status: Abnormal   Collection Time: 06/20/2020  6:57 PM  Result Value Ref Range   Prothrombin Time 19.8 (H) 11.4 - 15.2 seconds   INR 1.7 (H) 0.8 - 1.2    Comment: (NOTE) INR goal varies based on device and disease states. Performed at [facility], [place] [address], [place], [place] [address]   Lactic acid, plasma     Status: Abnormal   Collection Time: 06/19/2020  6:57 PM  Result Value Ref Range   Lactic Acid, Venous 6.8 (HH) 0.5 - 1.9 mmol/L    Comment: CRITICAL VALUE NOTED.  VALUE IS CONSISTENT WITH PREVIOUSLY REPORTED AND CALLED VALUE. Performed at [facility], [place] [address] [place], [place] [address]   Glucose, capillary     Status: Abnormal   Collection Time: 06/05/2020  7:23 PM  Result Value Ref Range   Glucose-Capillary 179 (H) 70 - 99 mg/dL    Comment: Glucose reference range applies only to samples taken after fasting for at least 8 hours.  CBC     Status: Abnormal   Collection Time: 05/26/2020 10:03 PM  Result Value Ref Range   WBC 13.8 (H) 4.0 - 10.5 K/uL   RBC 4.99 3.87 - 5.11 MIL/uL   Hemoglobin 14.9 12.0 - 15.0 g/dL   HCT 44.2 36 - 46 %   MCV 88.6 80.0 - 100.0 fL   MCH 29.9 26.0 - 34.0 pg   MCHC 33.7 30.0 - 36.0 g/dL   RDW 16.4 (H) 11.5 - 15.5 %   Platelets 82 (L) 150 - 400 K/uL    Comment: Immature Platelet Fraction may be clinically indicated, consider ordering this additional test [id number] REPEATED TO VERIFY    nRBC 0.3 (H) 0.0 - 0.2 %    Comment: Performed at [facility], [place] [address], [place], [place] [address]    Basic metabolic panel     Status: Abnormal   Collection Time: 06/20/2020 10:15 PM  Result Value Ref Range   Sodium 150 (H) 135 - 145 mmol/L   Potassium 3.6 3.5 - 5.1 mmol/L   Chloride 111 98 - 111 mmol/L   CO2 19 (L) 22 - 32 mmol/L   Glucose, Bld 218 (H) 70 - 99 mg/dL    Comment: Glucose reference range applies only to samples taken after fasting for at least 8 hours.   BUN 78 (H) 6 - 20 mg/dL   Creatinine, Ser 2.80 (H) 0.44 - 1.00 mg/dL   Calcium 7.2 (L) 8.9 - 10.3 mg/dL   GFR calc non Af Amer 18 (L) >60 mL/min   GFR calc Af Amer 21 (L) >60 mL/min   Anion gap 20 (H) 5 - 15    Comment: Performed at [facility] [address] [place], [place] [address]  Glucose, capillary     Status: Abnormal   Collection Time: 06/12/2020 11:21 PM  Result Value Ref Range   Glucose-Capillary 183 (H) 70 - 99 mg/dL    Comment: Glucose reference range applies only to samples  taken after fasting for at least 8 hours.  Lactic acid, plasma     Status: Abnormal   Collection Time: 06/20/2020 11:35 PM  Result Value Ref Range   Lactic Acid, Venous 7.1 (HH) 0.5 - 1.9 mmol/L    Comment: CRITICAL VALUE NOTED.  VALUE IS CONSISTENT WITH PREVIOUSLY REPORTED AND CALLED VALUE. Performed at [facility], [place] [address] [place], [place] [address]   Lactic acid, plasma     Status: Abnormal   Collection Time: 06/16/20  2:31 AM  Result Value Ref Range   Lactic Acid, Venous 8.6 (HH) 0.5 - 1.9 mmol/L    Comment: CRITICAL VALUE NOTED.  VALUE IS CONSISTENT WITH PREVIOUSLY REPORTED AND CALLED VALUE. Performed at [facility], [place] [address] [place], [place] [address]   Glucose, capillary     Status: Abnormal   Collection Time: 06/16/20  3:14 AM  Result Value Ref Range   Glucose-Capillary 135 (H) 70 - 99 mg/dL    Comment: Glucose reference range applies only to samples taken after fasting for at least 8 hours.  CBC     Status: Abnormal   Collection Time: 06/16/20  5:32 AM  Result Value Ref Range   WBC  21.0 (H) 4.0 - 10.5 K/uL   RBC 4.79 3.87 - 5.11 MIL/uL   Hemoglobin 14.5 12.0 - 15.0 g/dL   HCT 42.7 36 - 46 %   MCV 89.1 80.0 - 100.0 fL   MCH 30.3 26.0 - 34.0 pg   MCHC 34.0 30.0 - 36.0 g/dL   RDW 16.6 (H) 11.5 - 15.5 %   Platelets 77 (L) 150 - 400 K/uL    Comment: Immature Platelet Fraction may be clinically indicated, consider ordering this additional test [id number] REPEATED TO VERIFY    nRBC 0.2 0.0 - 0.2 %    Comment: Performed at [facility], [place] [address] [place], [place] [address]  Lactic acid, plasma     Status: Abnormal   Collection Time: 06/16/20  5:32 AM  Result Value Ref Range   Lactic Acid, Venous 9.0 (HH) 0.5 - 1.9 mmol/L    Comment: CRITICAL VALUE NOTED.  VALUE IS CONSISTENT WITH PREVIOUSLY REPORTED AND CALLED VALUE. Performed at [facility], [place] [address], [place], [place] [address]   Glucose, capillary     Status: Abnormal   Collection Time: 06/16/20  7:41 AM  Result Value Ref Range   Glucose-Capillary 132 (H) 70 - 99 mg/dL    Comment: Glucose reference range applies only to samples taken after fasting for at least 8 hours.    ECG   N/A  Telemetry   Sinus rhythm with PVC's and infrequent NSVT - Personally Reviewed  Radiology    CT ABDOMEN PELVIS WO CONTRAST  Result Date: 06/14/2020 CLINICAL DATA:  Abdominal pain and distension EXAM: CT ABDOMEN AND PELVIS WITHOUT CONTRAST TECHNIQUE: Multidetector CT imaging of the abdomen and pelvis was performed following the standard protocol without IV contrast. COMPARISON:  02/08/2020 FINDINGS: Lower chest: Minimal basilar scarring is noted. Gastric catheter is noted coiled in the stomach. Hepatobiliary: Liver demonstrates diffuse geographic decreased attenuation consistent with fatty infiltration. Gallbladder is well distended with hyperdense material as well as a few small gallstones. No wall thickening or pericholecystic fluid is noted. Pancreas: Unremarkable. No pancreatic ductal dilatation or  surrounding inflammatory changes. Spleen: Normal in size without focal abnormality. Adrenals/Urinary Tract: Adrenal glands are within normal limits. Kidneys demonstrate no renal calculi or urinary tract obstructive changes.  The bladder is partially distended. Stomach/Bowel: Colon is decompressed. The appendix is not well visualized. No inflammatory changes to suggest appendicitis are noted. Significant small bowel dilatation is noted throughout almost the entire small bowel with a transition zone in the right mid abdomen. No definitive mass lesion is seen to correspond with the transition zone although it appears somewhat similar to that seen on prior exam. Gastric catheter extends into the stomach. Vascular/Lymphatic: No significant vascular findings are present. No enlarged abdominal or pelvic lymph nodes. Reproductive: Uterus is within normal limits. There is again noted a large cystic mass lesion arising likely from the left ovary measuring approximately 21.7 cm in greatest transverse dimension. This is roughly similar to that seen on the prior exam. Other: Mild free fluid is noted slightly increased from the prior exam. Mild changes of anasarca are noted. Fat containing umbilical hernia is again seen and stable. Musculoskeletal: Degenerative changes of lumbar spine are noted. IMPRESSION: Increase in the degree of small-bowel obstruction with a transition zone in the right mid abdomen although no definitive mass lesion is seen. Stable large pelvic cystic mass likely ovarian in etiology. Cholelithiasis. Fatty infiltration of the liver. Electronically Signed   By: Inez Catalina M.D.   On: 06/14/2020 13:22   DG Abd 1 View  Result Date: 06/14/2020 CLINICAL DATA:  59 year old female with small bowel obstruction. EXAM: ABDOMEN - 1 VIEW COMPARISON:  CT abdomen pelvis dated 06/14/2020. FINDINGS: Enteric tube with tip in the mid to distal stomach. Mildly dilated small bowel loops in the upper abdomen measuring 4.4 cm  in diameter. IMPRESSION: Enteric tube with tip in the mid to distal stomach. Electronically Signed   By: Anner Crete M.D.   On: 06/14/2020 20:03   DG CHEST PORT 1 VIEW  Result Date: 05/26/2020 CLINICAL DATA:  Endotracheal tube. EXAM: PORTABLE CHEST 1 VIEW COMPARISON:  June 14, 2020. FINDINGS: Stable cardiomediastinal silhouette. Endotracheal nasogastric tubes are unchanged in position. Left-sided pacemaker is unchanged in position. Right internal jugular catheter is noted, but tip has been withdrawn to the SVC. Lungs are clear. No pneumothorax or pleural effusion is noted. Bony thorax is unremarkable. IMPRESSION: No acute cardiopulmonary abnormality seen. Stable support apparatus. Right internal jugular catheter tip has been withdrawn to the SVC. Electronically Signed   By: Marijo Conception M.D.   On: 06/22/2020 09:11   DG CHEST PORT 1 VIEW  Result Date: 06/14/2020 CLINICAL DATA:  New nasogastric tube and central line EXAM: PORTABLE CHEST 1 VIEW COMPARISON:  06/16/2020 FINDINGS: Nasogastric tube is looped in the stomach, with the tip at the gastric body to antrum. The upper chest is minimally excluded. Pacer/AICD device. Right internal jugular line tip at mid right atrium. Normal heart size for level of inspiration. No pleural effusion or pneumothorax. No congestive failure. No lobar consolidation. Low lung volumes with resultant pulmonary interstitial prominence. IMPRESSION: Nasogastric tube appropriately positioned. Right-sided internal jugular line tip at mid right atrium. No pneumothorax or other acute complication. Low lung volumes, without acute disease. Electronically Signed   By: Abigail Miyamoto M.D.   On: 06/14/2020 11:07    Cardiac Studies   N/A  Assessment   1. Active Problems: 2.   Shock ([facility]) 3.   SIRS (systemic inflammatory response syndrome) ([facility]) 4.   Abdominal distention 5.   SBO (small bowel obstruction) ([place]) 6.   Plan   1. NSVT appears to be less today. Continue  pressors and avoid B agonist if possible. Keep K>4 and Mg >2.  Could use amiodarone if needed for persistent NSVT.  Time Spent Directly with Patient:  I have spent a total of 35 minutes with the patient reviewing hospital notes, telemetry, EKGs, labs and examining the patient as well as establishing an assessment and plan that was discussed personally with the patient.  > 50% of time was spent in direct patient care.  Length of Stay:  LOS: 6 days   Pixie Casino, MD, [facility], [place] Director of the [facility]id [facility] &  Cardiovascular [facility] Clinic Diplomate of the American Board of Clinical Lipidology Attending Cardiologist  Direct Dial: [phone]  Fax: [phone]  Website:  [email or web address] 06/16/2020, 9:28 AM

## 2020-06-16 NOTE — Progress Notes (Signed)
eLink Physician-Brief Progress Note Patient Name: [name] DOB: [birth year] MRN: [id number]   Date of Service  06/16/2020  HPI/Events of Note  Serum lactic acid > 11  eICU Interventions  Albumin 5  % 500 ml iv fluid bolus now        [employer] 06/16/2020, 11:39 PM

## 2020-06-16 NOTE — Progress Notes (Signed)
NAME:  Kari [name], MRN:  [id number], DOB:  09-May-1961, LOS: 6 ADMISSION DATE:  06/04/2020, CONSULTATION DATE:  06/16/20 REFERRING MD:  Kari Shave, MD, CHIEF COMPLAINT:  Weak  Brief History   [name] is a 59 y.o. 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  She was down for 13-14 hours, until her sister came to check on her and found her on the floor.  The patient reports not eating or drinking well over the past 2 weeks and has been feeling progressively weaker.  She lives alone and normally walks with a cane.  She denies any fever, cough, congestion, runny nose, pain with urination or diarrhea.  She has chronic venous stasis ulcers BLE, follows with Dr. Sharol [name], and states that she feels like her wounds are improving and denies increased redness, pain or edema.   Upon arrival to the ED, code sepsis was called due to hypotension and hypothermia (96.6 F). Lactic acid 2.0, WBC 12.0, hemoglobin 11.5, potassium 2.8, creatinine 2.40, albumin 1.4.  UA showed hazy urine with protein 30, few bacteria.  Chest x-ray showed no acute disease. Initial EKG showed sinus rhythm with PVCs, repeat EKG showed right bundle branch block with prolonged QTc  of 561.  Potassium was repleted with 10 mEq x3 and IV fluids were started at 30 cc/kg, 1 L of 5% albumin and another liter of crystalloid.  Past Medical History   Past Medical History:  Diagnosis Date  . Arthritis   . Asthma   . CHF (congestive heart failure) ([place]) 03/12/2014   a. EF 40-45% by echo in 06/2017 with normal cors by cath. ICD placed following  VT arrest  . Depression   . Headache(784.0)   . History of blood transfusion   . Hyperlipidemia   . Hypertension   . Lung nodules   . Myocardial infarction (Kari [name]) 06/26/2014  . Renal disorder    followed by [name]  . Sarcoidosis   . Ulcer    recurring, from chronic venous insufficiency   Significant Hospital Events   6/22 back on pressors, CVC placed, SBO diagnosed 6/23 ex-lap, A-line, intubated  Consults:  PCCM General surgery Cardiology Gyn Ortho  Procedures:  6/22 CVC  Significant Diagnostic Tests:  Echo 6/19 > EF 50-55%. CT A / P 6/22 > SBO, persistent large pelvic cyst  Micro Data:  SARS Coronavirus 2 - Negative Blood: NG Urine: polymicrobial  Surgical cultures of abd 6/23: AFB- Viral cx- Fungus- Aerobic/ anaerobic-   Antimicrobials:  Vancomycin 6/18 > 6/21 Cefepime 6/18 > 6/21.  6/22 >  Ceftriaxone 6/21 > 6/22 Flagyl 6/22> anidulafungin 6/24>  Interim history/subjective:  Remains encephalopathic despite no sedation. 3L lost via wound vac overnight- blood-tinged fluid.  Objective   Blood pressure (!) 75/56, pulse (!) 104, temperature (!) 101.1 F (38.4 C), temperature source Core, resp. rate (!) 29, height 5\' 7"  (1.702 m), weight 110.3 kg, last menstrual period 11/21/2011, SpO2 100 %. CVP:  [7 mmHg-15 mmHg] 7 mmHg  Vent Mode: PRVC FiO2 (%):  [30 %-50 %] 30 % Set Rate:  [20 bmp] 20 bmp Vt Set:  [490 mL]  490 mL PEEP:  [5 cmH20] 5 cmH20 Plateau Pressure:  [19 cmH20-25 cmH20] 19 cmH20   Intake/Output Summary (Last 24 hours) at 06/16/2020 5170 Last data filed at 06/16/2020 0600 Gross per 24 hour  Intake 8668.66 ml  Output 2770 ml  Net 5898.66 ml   Filed Weights   06/19/2020 1316 05/25/2020 2320 06/11/20 1430  Weight: 102.1 kg 107.2 kg 110.3 kg   Examination: General: critically ill appearing woman laying in bed intubated, not sedated   Neuro: grimaces to abdominal palpation;  HEENT: [place]/AT, eyes anicteric, NGT in place Cardiovascular:  irreg rhythm, regular rate Lungs: Respirations even and unlabored.  CTA bilaterally, No W/R/R.  Abdomen: BS hypoactive. Slightly distended.  Tender to palpation. Musculoskeletal: No gross deformities, no edema.  Wounds as seen in photos below. Skin: Leg dressings C/D/I.  Intact, warm, no rashes.    Assessment & Plan:   Septic shock due to peritonitis from SBO w/ perf. SBO 2/2 newly diagnosed abdominal mass. Cultures negative to date. - vasopressors PRN to maintain MAP >65 - con't cefepime, flagyl. Adding anidulafungin. -Appreciate surgery's assistance- back to OR tomorrow most likely. -monitor wound vac output- need to volume resuscitate then match I/Os  -NGT to suction -Surgery will discuss with gyn regarding management of abdominal mass. Cyto & path sent from OR on 6/23.  Acute respiratory failure with hypoxia requiring MV -mental status precludes extubation -daily SAT & SBT when appropriate. Currently on no sedation, too unstable metabolically for SBT.   AKI on CKD stage III, pre-renal - due to poor oral intake past 2 weeks at presentation. Now volume down from SBO, sepsis, high ongoing abdominal losses Lactic acidosis due to sepsis Hypokalemia Hypomagnesemia Hypophosphatemia hypernatremia -aggressive volume resuscitation today -strict I/Os -renally dose meds, avoid nephrotoxic meds -serial lactates -electrolyte repletion & monitoring -starting D5w to improve/ limit hypernatremia a/w bicarb drip  Coagulopathy -con't to monitor  Chronic venous stasis and ulcers (followed by Dr. Sharol [name]) -con't wound care -ortho consult- Dr. Sharol [name] primary surgeon  Protein malnutrition with severe hypoalbuminemia  -will likely require TPN during bowel rest in the future  Prolonged QTc Frequent ectopy- chronic  H/o VF arrest, NICM presumed sarcoidosis -con't to monitor on tele -optimize electrolytes -AICD in place  Acute encephalopathy -con't MV for airway protection  Progressive  weakness s/p ground-level fall. -PT recommending SNF earlier in hospitalization -anticipate prolonged hospitalization and recovery  Best practice:  Diet:  NPO. Pain/Anxiety/Delirium protocol (if indicated): n/a VAP protocol (if indicated): yes DVT prophylaxis:  On hold fo rOR GI prophylaxis: n/a Mobility:  Bedrest   Code Status: Full Family Communication: Sister updated over phone Disposition: ICU   This patient is critically ill with multiple organ system failure which requires frequent high complexity decision making, assessment, support, evaluation, and titration of therapies. This was completed through the application of advanced monitoring technologies and extensive interpretation of multiple databases. During this encounter critical care time was devoted to patient care services described in this note for 65 minutes.  Julian Hy, DO 06/16/20 11:54 AM [facility]

## 2020-06-16 NOTE — Consult Note (Signed)
error 

## 2020-06-17 ENCOUNTER — Inpatient Hospital Stay (HOSPITAL_COMMUNITY): Payer: Medicare Other | Admitting: Certified Registered Nurse Anesthetist

## 2020-06-17 ENCOUNTER — Encounter (HOSPITAL_COMMUNITY): Admission: AD | Disposition: E | Payer: Self-pay | Source: Home / Self Care | Attending: Critical Care Medicine

## 2020-06-17 DIAGNOSIS — E872 Acidosis: Secondary | ICD-10-CM

## 2020-06-17 DIAGNOSIS — D271 Benign neoplasm of left ovary: Secondary | ICD-10-CM

## 2020-06-17 DIAGNOSIS — I4729 Other ventricular tachycardia: Secondary | ICD-10-CM

## 2020-06-17 DIAGNOSIS — D689 Coagulation defect, unspecified: Secondary | ICD-10-CM

## 2020-06-17 DIAGNOSIS — N83202 Unspecified ovarian cyst, left side: Secondary | ICD-10-CM

## 2020-06-17 DIAGNOSIS — D696 Thrombocytopenia, unspecified: Secondary | ICD-10-CM

## 2020-06-17 HISTORY — PX: PARTIAL COLECTOMY: SHX5273

## 2020-06-17 HISTORY — PX: BOWEL RESECTION: SHX1257

## 2020-06-17 HISTORY — PX: LAPAROTOMY: SHX154

## 2020-06-17 LAB — CBC WITH DIFFERENTIAL/PLATELET
Abs Immature Granulocytes: 0 10*3/uL (ref 0.00–0.07)
Basophils Absolute: 0 10*3/uL (ref 0.0–0.1)
Basophils Relative: 0 %
Eosinophils Absolute: 0.1 10*3/uL (ref 0.0–0.5)
Eosinophils Relative: 1 %
HCT: 14.5 % — ABNORMAL LOW (ref 36.0–46.0)
Hemoglobin: 4.8 g/dL — CL (ref 12.0–15.0)
Lymphocytes Relative: 34 %
Lymphs Abs: 4.1 10*3/uL — ABNORMAL HIGH (ref 0.7–4.0)
MCH: 29.8 pg (ref 26.0–34.0)
MCHC: 33.1 g/dL (ref 30.0–36.0)
MCV: 90.1 fL (ref 80.0–100.0)
Monocytes Absolute: 0.7 10*3/uL (ref 0.1–1.0)
Monocytes Relative: 6 %
Neutro Abs: 7.1 10*3/uL (ref 1.7–7.7)
Neutrophils Relative %: 59 %
Platelets: 19 10*3/uL — CL (ref 150–400)
RBC: 1.61 MIL/uL — ABNORMAL LOW (ref 3.87–5.11)
RDW: 15.7 % — ABNORMAL HIGH (ref 11.5–15.5)
WBC: 12 10*3/uL — ABNORMAL HIGH (ref 4.0–10.5)
nRBC: 0.2 % (ref 0.0–0.2)

## 2020-06-17 LAB — POCT I-STAT 7, (LYTES, BLD GAS, ICA,H+H)
Acid-Base Excess: 0 mmol/L (ref 0.0–2.0)
Acid-Base Excess: 0 mmol/L (ref 0.0–2.0)
Acid-Base Excess: 3 mmol/L — ABNORMAL HIGH (ref 0.0–2.0)
Bicarbonate: 23.4 mmol/L (ref 20.0–28.0)
Bicarbonate: 23.7 mmol/L (ref 20.0–28.0)
Bicarbonate: 25 mmol/L (ref 20.0–28.0)
Calcium, Ion: 0.78 mmol/L — CL (ref 1.15–1.40)
Calcium, Ion: 0.85 mmol/L — CL (ref 1.15–1.40)
Calcium, Ion: 0.83 mmol/L — CL (ref 1.15–1.40)
HCT: 26 % — ABNORMAL LOW (ref 36.0–46.0)
HCT: 25 % — ABNORMAL LOW (ref 36.0–46.0)
HCT: 17 % — ABNORMAL LOW (ref 36.0–46.0)
Hemoglobin: 8.8 g/dL — ABNORMAL LOW (ref 12.0–15.0)
Hemoglobin: 8.5 g/dL — ABNORMAL LOW (ref 12.0–15.0)
Hemoglobin: 5.8 g/dL — CL (ref 12.0–15.0)
O2 Saturation: 100 %
O2 Saturation: 100 %
O2 Saturation: 100 %
Patient temperature: 35.2
Patient temperature: 34.6
Potassium: 3.1 mmol/L — ABNORMAL LOW (ref 3.5–5.1)
Potassium: 3.1 mmol/L — ABNORMAL LOW (ref 3.5–5.1)
Potassium: 3.8 mmol/L (ref 3.5–5.1)
Sodium: 144 mmol/L (ref 135–145)
Sodium: 144 mmol/L (ref 135–145)
Sodium: 145 mmol/L (ref 135–145)
TCO2: 24 mmol/L (ref 22–32)
TCO2: 25 mmol/L (ref 22–32)
TCO2: 26 mmol/L (ref 22–32)
pCO2 arterial: 31.1 mmHg — ABNORMAL LOW (ref 32.0–48.0)
pCO2 arterial: 29 mmHg — ABNORMAL LOW (ref 32.0–48.0)
pCO2 arterial: 27.2 mmHg — ABNORMAL LOW (ref 32.0–48.0)
pH, Arterial: 7.477 — ABNORMAL HIGH (ref 7.350–7.450)
pH, Arterial: 7.511 — ABNORMAL HIGH (ref 7.350–7.450)
pH, Arterial: 7.571 — ABNORMAL HIGH (ref 7.350–7.450)
pO2, Arterial: 193 mmHg — ABNORMAL HIGH (ref 83.0–108.0)
pO2, Arterial: 192 mmHg — ABNORMAL HIGH (ref 83.0–108.0)
pO2, Arterial: 144 mmHg — ABNORMAL HIGH (ref 83.0–108.0)

## 2020-06-17 LAB — BASIC METABOLIC PANEL
Anion gap: 25 — ABNORMAL HIGH (ref 5–15)
BUN: 73 mg/dL — ABNORMAL HIGH (ref 6–20)
CO2: 16 mmol/L — ABNORMAL LOW (ref 22–32)
Calcium: 6.6 mg/dL — ABNORMAL LOW (ref 8.9–10.3)
Chloride: 103 mmol/L (ref 98–111)
Creatinine, Ser: 2.81 mg/dL — ABNORMAL HIGH (ref 0.44–1.00)
GFR calc Af Amer: 21 mL/min — ABNORMAL LOW (ref >60)
GFR calc non Af Amer: 18 mL/min — ABNORMAL LOW (ref >60)
Glucose, Bld: 216 mg/dL — ABNORMAL HIGH (ref 70–99)
Potassium: 3.6 mmol/L (ref 3.5–5.1)
Sodium: 144 mmol/L (ref 135–145)

## 2020-06-17 LAB — COMPREHENSIVE METABOLIC PANEL
ALT: 14 U/L (ref 0–44)
ALT: 14 U/L (ref 0–44)
AST: 24 U/L (ref 15–41)
AST: 23 U/L (ref 15–41)
Albumin: 3 g/dL — ABNORMAL LOW (ref 3.5–5.0)
Albumin: 2 g/dL — ABNORMAL LOW (ref 3.5–5.0)
Alkaline Phosphatase: 28 U/L — ABNORMAL LOW (ref 38–126)
Alkaline Phosphatase: 31 U/L — ABNORMAL LOW (ref 38–126)
Anion gap: 23 — ABNORMAL HIGH (ref 5–15)
Anion gap: 21 — ABNORMAL HIGH (ref 5–15)
BUN: 71 mg/dL — ABNORMAL HIGH (ref 6–20)
BUN: 68 mg/dL — ABNORMAL HIGH (ref 6–20)
CO2: 19 mmol/L — ABNORMAL LOW (ref 22–32)
CO2: 21 mmol/L — ABNORMAL LOW (ref 22–32)
Calcium: 6.6 mg/dL — ABNORMAL LOW (ref 8.9–10.3)
Calcium: 6.9 mg/dL — ABNORMAL LOW (ref 8.9–10.3)
Chloride: 101 mmol/L (ref 98–111)
Chloride: 101 mmol/L (ref 98–111)
Creatinine, Ser: 2.74 mg/dL — ABNORMAL HIGH (ref 0.44–1.00)
Creatinine, Ser: 2.49 mg/dL — ABNORMAL HIGH (ref 0.44–1.00)
GFR calc Af Amer: 21 mL/min — ABNORMAL LOW (ref >60)
GFR calc Af Amer: 24 mL/min — ABNORMAL LOW (ref >60)
GFR calc non Af Amer: 18 mL/min — ABNORMAL LOW (ref >60)
GFR calc non Af Amer: 21 mL/min — ABNORMAL LOW (ref >60)
Glucose, Bld: 203 mg/dL — ABNORMAL HIGH (ref 70–99)
Glucose, Bld: 193 mg/dL — ABNORMAL HIGH (ref 70–99)
Potassium: 3.5 mmol/L (ref 3.5–5.1)
Potassium: 3.2 mmol/L — ABNORMAL LOW (ref 3.5–5.1)
Sodium: 143 mmol/L (ref 135–145)
Sodium: 143 mmol/L (ref 135–145)
Total Bilirubin: 1.6 mg/dL — ABNORMAL HIGH (ref 0.3–1.2)
Total Bilirubin: 1.4 mg/dL — ABNORMAL HIGH (ref 0.3–1.2)
Total Protein: 4.2 g/dL — ABNORMAL LOW (ref 6.5–8.1)
Total Protein: 3.1 g/dL — ABNORMAL LOW (ref 6.5–8.1)

## 2020-06-17 LAB — PHOSPHORUS
Phosphorus: 2.9 mg/dL (ref 2.5–4.6)
Phosphorus: 3.3 mg/dL (ref 2.5–4.6)

## 2020-06-17 LAB — MAGNESIUM
Magnesium: 2.5 mg/dL — ABNORMAL HIGH (ref 1.7–2.4)
Magnesium: 2.2 mg/dL (ref 1.7–2.4)

## 2020-06-17 LAB — GLUCOSE, CAPILLARY
Glucose-Capillary: 194 mg/dL — ABNORMAL HIGH (ref 70–99)
Glucose-Capillary: 188 mg/dL — ABNORMAL HIGH (ref 70–99)
Glucose-Capillary: 177 mg/dL — ABNORMAL HIGH (ref 70–99)
Glucose-Capillary: 163 mg/dL — ABNORMAL HIGH (ref 70–99)
Glucose-Capillary: 183 mg/dL — ABNORMAL HIGH (ref 70–99)

## 2020-06-17 LAB — CBC
HCT: 23.5 % — ABNORMAL LOW (ref 36.0–46.0)
HCT: 27.2 % — ABNORMAL LOW (ref 36.0–46.0)
Hemoglobin: 7.9 g/dL — ABNORMAL LOW (ref 12.0–15.0)
Hemoglobin: 9.4 g/dL — ABNORMAL LOW (ref 12.0–15.0)
MCH: 29.9 pg (ref 26.0–34.0)
MCH: 30.8 pg (ref 26.0–34.0)
MCHC: 33.6 g/dL (ref 30.0–36.0)
MCHC: 34.6 g/dL (ref 30.0–36.0)
MCV: 89 fL (ref 80.0–100.0)
MCV: 89.2 fL (ref 80.0–100.0)
Platelets: 36 10*3/uL — ABNORMAL LOW (ref 150–400)
Platelets: 47 10*3/uL — ABNORMAL LOW (ref 150–400)
RBC: 2.64 MIL/uL — ABNORMAL LOW (ref 3.87–5.11)
RBC: 3.05 MIL/uL — ABNORMAL LOW (ref 3.87–5.11)
RDW: 16.4 % — ABNORMAL HIGH (ref 11.5–15.5)
RDW: 16.6 % — ABNORMAL HIGH (ref 11.5–15.5)
WBC: 18 10*3/uL — ABNORMAL HIGH (ref 4.0–10.5)
WBC: 19.4 10*3/uL — ABNORMAL HIGH (ref 4.0–10.5)
nRBC: 0 % (ref 0.0–0.2)
nRBC: 0 % (ref 0.0–0.2)

## 2020-06-17 LAB — LACTIC ACID, PLASMA
Lactic Acid, Venous: 10.7 mmol/L (ref 0.5–1.9)
Lactic Acid, Venous: 7.6 mmol/L (ref 0.5–1.9)
Lactic Acid, Venous: 8.2 mmol/L (ref 0.5–1.9)
Lactic Acid, Venous: 9.3 mmol/L (ref 0.5–1.9)
Lactic Acid, Venous: >11 mmol/L (ref 0.5–1.9)

## 2020-06-17 LAB — PROTIME-INR
INR: 2.4 — ABNORMAL HIGH (ref 0.8–1.2)
INR: 1.9 — ABNORMAL HIGH (ref 0.8–1.2)
Prothrombin Time: 25.6 seconds — ABNORMAL HIGH (ref 11.4–15.2)
Prothrombin Time: 20.8 seconds — ABNORMAL HIGH (ref 11.4–15.2)

## 2020-06-17 LAB — PREPARE RBC (CROSSMATCH)

## 2020-06-17 LAB — FIBRINOGEN: Fibrinogen: 198 mg/dL — ABNORMAL LOW (ref 210–475)

## 2020-06-17 LAB — CEA: CEA: 1.6 ng/mL (ref 0.0–4.7)

## 2020-06-17 LAB — CA 125: Cancer Antigen (CA) 125: 30.1 U/mL (ref 0.0–38.1)

## 2020-06-17 SURGERY — LAPAROTOMY, EXPLORATORY
Anesthesia: General | Site: Abdomen

## 2020-06-17 MED ORDER — LACTATED RINGERS IV SOLN: INTRAVENOUS | Status: DC | PRN

## 2020-06-17 MED ORDER — TRANEXAMIC ACID-NACL 1000-0.7 MG/100ML-% IV SOLN
INTRAVENOUS | Status: DC | PRN
Start: 2020-06-17 — End: 2020-06-17
  Administered 2020-06-17: 1000 mg via INTRAVENOUS

## 2020-06-17 MED ORDER — FENTANYL CITRATE (PF) 250 MCG/5ML IJ SOLN
INTRAMUSCULAR | Status: AC
  Filled 2020-06-17: qty 5

## 2020-06-17 MED ORDER — SODIUM CHLORIDE 0.9% IV SOLUTION: Freq: Once | INTRAVENOUS | Status: AC

## 2020-06-17 MED ORDER — LACTATED RINGERS IV BOLUS
500.0000 mL | Freq: Once | INTRAVENOUS | Status: AC
  Administered 2020-06-17: 500 mL via INTRAVENOUS

## 2020-06-17 MED ORDER — ALBUMIN HUMAN 5 % IV SOLN
INTRAVENOUS | Status: DC | PRN
Start: 2020-06-17 — End: 2020-06-17

## 2020-06-17 MED ORDER — PROPOFOL 10 MG/ML IV BOLUS
INTRAVENOUS | Status: AC
  Filled 2020-06-17: qty 20

## 2020-06-17 MED ORDER — FENTANYL CITRATE (PF) 250 MCG/5ML IJ SOLN
INTRAMUSCULAR | Status: DC | PRN
  Administered 2020-06-17: 50 ug via INTRAVENOUS

## 2020-06-17 MED ORDER — MIDAZOLAM HCL 2 MG/2ML IJ SOLN
INTRAMUSCULAR | Status: AC
  Filled 2020-06-17: qty 2

## 2020-06-17 MED ORDER — ALBUMIN HUMAN 5 % IV SOLN
25.0000 g | Freq: Once | INTRAVENOUS | Status: AC
  Administered 2020-06-17: 25 g via INTRAVENOUS
  Filled 2020-06-17: qty 500

## 2020-06-17 MED ORDER — CALCIUM GLUCONATE-NACL 1-0.675 GM/50ML-% IV SOLN
1.0000 g | INTRAVENOUS | Status: AC
  Administered 2020-06-17: 1000 mg via INTRAVENOUS
  Filled 2020-06-17: qty 50

## 2020-06-17 MED ORDER — VASOPRESSIN 20 UNIT/ML IV SOLN
0.0300 [IU]/min | INTRAVENOUS | Status: DC
  Administered 2020-06-17: 0.03 [IU]/min via INTRAVENOUS
  Filled 2020-06-17: qty 2

## 2020-06-17 MED ORDER — 0.9 % SODIUM CHLORIDE (POUR BTL) OPTIME
TOPICAL | Status: DC | PRN
  Administered 2020-06-17 (×4): 1000 mL

## 2020-06-17 MED ORDER — PROPOFOL 10 MG/ML IV BOLUS
INTRAVENOUS | Status: DC | PRN
  Administered 2020-06-17: 40 mg via INTRAVENOUS

## 2020-06-17 MED ORDER — TRANEXAMIC ACID-NACL 1000-0.7 MG/100ML-% IV SOLN
INTRAVENOUS | Status: AC
  Filled 2020-06-17: qty 100

## 2020-06-17 MED ORDER — POTASSIUM CHLORIDE 10 MEQ/50ML IV SOLN
10.0000 meq | INTRAVENOUS | Status: AC
  Administered 2020-06-17 (×5): 10 meq via INTRAVENOUS
  Filled 2020-06-17 (×5): qty 50

## 2020-06-17 MED ORDER — CALCIUM GLUCONATE-NACL 1-0.675 GM/50ML-% IV SOLN
1.0000 g | Freq: Once | INTRAVENOUS | Status: AC
  Administered 2020-06-17: 1000 mg via INTRAVENOUS
  Filled 2020-06-17: qty 50

## 2020-06-17 MED ORDER — CALCIUM CHLORIDE 10 % IV SOLN
INTRAVENOUS | Status: DC | PRN
Start: 2020-06-17 — End: 2020-06-17
  Administered 2020-06-17: 300 mg via INTRAVENOUS
  Administered 2020-06-17: 1000 mg via INTRAVENOUS
  Administered 2020-06-17: 200 mg via INTRAVENOUS
  Administered 2020-06-17: 300 mg via INTRAVENOUS
  Administered 2020-06-17: 200 mg via INTRAVENOUS

## 2020-06-17 MED ORDER — MIDAZOLAM HCL 5 MG/5ML IJ SOLN
INTRAMUSCULAR | Status: DC | PRN
  Administered 2020-06-17: 2 mg via INTRAVENOUS

## 2020-06-17 MED ORDER — POTASSIUM CHLORIDE 10 MEQ/50ML IV SOLN
10.0000 meq | INTRAVENOUS | Status: AC
  Administered 2020-06-17 (×3): 10 meq via INTRAVENOUS
  Administered 2020-06-17 (×2): 50 mL via INTRAVENOUS
  Filled 2020-06-17 (×5): qty 50

## 2020-06-17 MED ORDER — ALBUMIN HUMAN 25 % IV SOLN
50.0000 g | Freq: Once | INTRAVENOUS | Status: AC
  Administered 2020-06-17: 50 g via INTRAVENOUS
  Filled 2020-06-17: qty 200

## 2020-06-17 MED ORDER — SODIUM CHLORIDE 0.9 % IV SOLN: 1.0000 g | Freq: Once | INTRAVENOUS | Status: DC

## 2020-06-17 MED ORDER — SUGAMMADEX SODIUM 200 MG/2ML IV SOLN
INTRAVENOUS | Status: DC | PRN
  Administered 2020-06-17: 200 mg via INTRAVENOUS

## 2020-06-17 MED ORDER — ROCURONIUM BROMIDE 10 MG/ML (PF) SYRINGE
PREFILLED_SYRINGE | INTRAVENOUS | Status: DC | PRN
  Administered 2020-06-17: 40 mg via INTRAVENOUS
  Administered 2020-06-17: 60 mg via INTRAVENOUS

## 2020-06-17 MED ORDER — STERILE WATER FOR IRRIGATION IR SOLN
Status: DC | PRN
  Administered 2020-06-17: 1000 mL

## 2020-06-17 SURGICAL SUPPLY — 55 items
CANISTER SUCT 3000ML PPV (MISCELLANEOUS) ×4 IMPLANT
COVER MAYO STAND STRL (DRAPES) ×4 IMPLANT
COVER SURGICAL LIGHT HANDLE (MISCELLANEOUS) ×8 IMPLANT
DRAPE HALF SHEET 40X57 (DRAPES) ×8 IMPLANT
DRAPE LAPAROSCOPIC ABDOMINAL (DRAPES) ×4 IMPLANT
DRAPE UTILITY XL STRL (DRAPES) ×4 IMPLANT
DRAPE WARM FLUID 44X44 (DRAPES) ×4 IMPLANT
DRSG PAD ABDOMINAL 8X10 ST (GAUZE/BANDAGES/DRESSINGS) ×4 IMPLANT
ELECT CAUTERY BLADE 6.4 (BLADE) ×8 IMPLANT
ELECT REM PT RETURN 9FT ADLT (ELECTROSURGICAL) ×4
ELECTRODE REM PT RTRN 9FT ADLT (ELECTROSURGICAL) ×2 IMPLANT
GLOVE BIO SURGEON STRL SZ 6.5 (GLOVE) ×6 IMPLANT
GLOVE BIO SURGEON STRL SZ7.5 (GLOVE) ×8 IMPLANT
GLOVE BIO SURGEONS STRL SZ 6.5 (GLOVE) ×2
GLOVE BIOGEL PI IND STRL 6.5 (GLOVE) ×2 IMPLANT
GLOVE BIOGEL PI IND STRL 7.0 (GLOVE) ×4 IMPLANT
GLOVE BIOGEL PI INDICATOR 6.5 (GLOVE) ×2
GLOVE BIOGEL PI INDICATOR 7.0 (GLOVE) ×4
GLOVE ORTHOPEDIC STR SZ6.5 (GLOVE) ×4 IMPLANT
GOWN STRL REUS W/ TWL LRG LVL3 (GOWN DISPOSABLE) ×10 IMPLANT
GOWN STRL REUS W/TWL LRG LVL3 (GOWN DISPOSABLE) ×20
HANDLE SUCTION POOLE (INSTRUMENTS) ×2 IMPLANT
KIT BASIN OR (CUSTOM PROCEDURE TRAY) ×4 IMPLANT
KIT TURNOVER KIT B (KITS) ×4 IMPLANT
LIGASURE IMPACT 36 18CM CVD LR (INSTRUMENTS) ×4 IMPLANT
NS IRRIG 1000ML POUR BTL (IV SOLUTION) ×8 IMPLANT
PACK GENERAL/GYN (CUSTOM PROCEDURE TRAY) ×4 IMPLANT
PAD ARMBOARD 7.5X6 YLW CONV (MISCELLANEOUS) ×4 IMPLANT
PENCIL SMOKE EVACUATOR (MISCELLANEOUS) ×4 IMPLANT
PREP POVIDONE IODINE SPRAY 2OZ (MISCELLANEOUS) ×4 IMPLANT
RELOAD PROXIMATE 75MM BLUE (ENDOMECHANICALS) ×8 IMPLANT
RELOAD STAPLER LINE PROX 60 GR (STAPLE) ×2 IMPLANT
SOLUTION BETADINE 4OZ (MISCELLANEOUS) ×4 IMPLANT
SPONGE LAP 18X18 RF (DISPOSABLE) ×12 IMPLANT
STAPLER PROXIMATE 75MM BLUE (STAPLE) ×4 IMPLANT
STAPLER RELOAD LINE PROX 60 GR (STAPLE) ×4
STAPLER VISISTAT 35W (STAPLE) IMPLANT
SUCTION POOLE HANDLE (INSTRUMENTS) ×4
SUT MON AB 4-0 PS1 27 (SUTURE) IMPLANT
SUT PDS AB 1 TP1 96 (SUTURE) ×8 IMPLANT
SUT PLAIN 2 0 (SUTURE)
SUT PLAIN ABS 2-0 CT1 27XMFL (SUTURE) IMPLANT
SUT SILK 2 0 SH CR/8 (SUTURE) ×8 IMPLANT
SUT SILK 2 0 TIES 10X30 (SUTURE) ×4 IMPLANT
SUT SILK 3 0 SH CR/8 (SUTURE) ×4 IMPLANT
SUT SILK 3 0 TIES 10X30 (SUTURE) ×4 IMPLANT
SUT VIC AB 0 CT1 27 (SUTURE)
SUT VIC AB 0 CT1 27XBRD ANBCTR (SUTURE) IMPLANT
SUT VIC AB 2-0 CT1 27 (SUTURE) ×4
SUT VIC AB 2-0 CT1 TAPERPNT 27 (SUTURE) ×2 IMPLANT
SUT VIC AB 3-0 SH 18 (SUTURE) IMPLANT
TOWEL GREEN STERILE (TOWEL DISPOSABLE) ×4 IMPLANT
TUBE CONNECTING 12'X1/4 (SUCTIONS) ×1
TUBE CONNECTING 12X1/4 (SUCTIONS) ×3 IMPLANT
YANKAUER SUCT BULB TIP NO VENT (SUCTIONS) ×4 IMPLANT

## 2020-06-17 NOTE — Progress Notes (Signed)
Nutrition Follow-up  RD working remotely.  DOCUMENTATION CODES:   Obesity unspecified  INTERVENTION:   Recommend begin TPN.  Once nutrition is started, monitor magnesium, potassium, and phosphorus, MD to replete as needed, as pt is at risk for refeeding syndrome given reports of poor PO intake for several weeks PTA.  NUTRITION DIAGNOSIS:   Inadequate oral intake related to altered GI function as evidenced by NPO status.  Ongoing  GOAL:   Provide needs based on ASPEN/SCCM guidelines   Unmet  MONITOR:   Vent status, Labs, Weight trends, Skin, I & O's  ASSESSMENT:   59 year old female who presented on 6/18 with complaints of weakness. PMH of asthma, CHF, HLD, HTN, sarcoidosis, MI, CKD stage III, chronic venous stasis ulcers to bilateral heels, VT arrest in 2018 s/p ICD placement, gout, umbilical hernia. Admitted with hypovolemic shock.   6/22 - back on pressors, NGT placed for decompression, CT scan showing SBO and persistent large pelvic cyst, incarcerated umbilical hernia reduced by Surgery. 6/23 - intubated, S/P ex lap with resection of terminal ileum and cecum for necrotic bowel, bowel left in discontinuity, open abdomen with VAC.  Plans for return to OR today for hopeful reconnection of bowel and potential removal of ovarian cyst.  Per discussion in ICU rounds, will not be able to use gut for nutrition until at least next week. Plans to begin TPN when patient more stable.   NG tube remains in place, no output documented.  Per RN edema assessment, pt with mild pitting generalized edema, mild pitting edema to BLE, moderate pitting edema to BUE, and mild pitting perineal edema.  Patient remains intubated on ventilator support MV: 10.9 L/min Temp (24hrs), Avg:97.7 F (36.5 C), Min:96.5 F (35.8 C), Max:99.9 F (37.7 C)   Labs reviewed. BUN 71 (H), creat 2.74 (H), mag 2.5 (H) CBG: 194-198  Medications reviewed and include novolog, levophed, phenylephrine, IV KCl,  sodium bicarb.  UOP: 750 ml x 24 hours Drain output: 1250 ml x 24 hours I/O's: +24 L since admit   Diet Order:   Diet Order            Diet NPO time specified  Diet effective now                 EDUCATION NEEDS:   No education needs have been identified at this time  Skin:  Skin Assessment: Skin Integrity Issues: Other: venous stasis ulcers to BLE Wound VAC: Open abdomen  Last BM:  06/14/20  Height:   Ht Readings from Last 1 Encounters:  06/11/20 5\' 7"  (1.702 m)    Weight:   Wt Readings from Last 1 Encounters:  06/11/20 110.3 kg    Ideal Body Weight:  61.4 kg  BMI:  Body mass index is 38.09 kg/m.  Estimated Nutritional Needs:   Kcal:  3818-2993  Protein:  123-143 grams  Fluid:  >/= 1.5 L   Lucas Mallow, RD, LDN, CNSC Please refer to Amion for contact information.

## 2020-06-17 NOTE — Progress Notes (Signed)
CRITICAL VALUE ALERT  Critical Value:  Hgb 4.8, Platelet 19  Date & Time Notied:  06/13/2020 @ 19:17  Provider Notified: Levada Schilling MD  Orders Received/Actions taken: TBD

## 2020-06-17 NOTE — Progress Notes (Signed)
NAME:  Kari [name], MRN:  [id number], DOB:  08/23/1961, LOS: 7 ADMISSION DATE:  06/21/2020, CONSULTATION DATE:  05/29/2020 REFERRING MD:  Gifford Shave, MD, CHIEF COMPLAINT:  Weak  Brief History   [name] is a 59 y.o. 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  She was down for 13-14 hours, until her sister came to check on her and found her on the floor.  The patient reports not eating or drinking well over the past 2 weeks and has been feeling progressively weaker.  She lives alone and normally walks with a cane.  She denies any fever, cough, congestion, runny nose, pain with urination or diarrhea.  She has chronic venous stasis ulcers BLE, follows with Dr. Sharol Given, and states that she feels like her wounds are improving and denies increased redness, pain or edema.   Upon arrival to the ED, code sepsis was called due to hypotension and hypothermia (96.6 F). Lactic acid 2.0, WBC 12.0, hemoglobin 11.5, potassium 2.8, creatinine 2.40, albumin 1.4.  UA showed hazy urine with protein 30, few bacteria.  Chest x-ray showed no acute disease. Initial EKG showed sinus rhythm with PVCs, repeat EKG showed right bundle branch block with prolonged QTc  of 561.  Potassium was repleted with 10 mEq x3 and IV fluids were started at 30 cc/kg, 1 L of 5% albumin and another liter of crystalloid.  Past Medical History   Past Medical History:  Diagnosis Date  . Arthritis   . Asthma   . CHF (congestive heart failure) ([place]) 03/12/2014   a. EF 40-45% by echo in 06/2017 with normal cors by cath. ICD placed following  VT arrest  . Depression   . Headache(784.0)   . History of blood transfusion   . Hyperlipidemia   . Hypertension   . Lung nodules   . Myocardial infarction ([place]) 06/26/2014  . Renal disorder    followed by [facility]  . Sarcoidosis   . Ulcer    recurring, from chronic venous insufficiency   Significant Hospital Events   6/22 back on pressors, CVC placed, SBO diagnosed 6/23 ex-lap, A-line, intubated  Consults:  PCCM General surgery Cardiology Gyn Ortho  Procedures:  6/22 CVC  Significant Diagnostic Tests:  Echo 6/19 > EF 50-55%. CT A / P 6/22 > SBO, persistent large pelvic cyst  Micro Data:  SARS Coronavirus 2 - Negative Blood: NG Urine: polymicrobial  Surgical cultures of abd 6/23: All pending AFB- Viral cx- Fungus- Aerobic/ anaerobic-    Antimicrobials:  Vancomycin 6/18 > 6/21 Cefepime 6/18 > 6/21.  6/22 >  Ceftriaxone 6/21 > 6/22 Flagyl 6/22> anidulafungin 6/24>  Interim history/subjective:  Urine output restarted overnight.  Wound VAC output slowing down.  Now having bleeding in her mouth.  Objective   Blood pressure 120/61, pulse 82, temperature (!) 96.9 F (36.1 C), temperature source Axillary, resp. rate 18, height 5\' 7"  (1.702 m), weight 110.3 kg, last menstrual period 11/21/2011, SpO2 100 %.    Vent Mode: PRVC FiO2 (%):  [30 %] 30 % Set Rate:  [20 bmp] 20 bmp Vt Set:  [490 mL] 490 mL  PEEP:  [5 cmH20] 5 cmH20 Plateau Pressure:  [21 cmH20-22 cmH20] 22 cmH20   Intake/Output Summary (Last 24 hours) at 05/29/2020 0839 Last data filed at 06/16/2020 0600 Gross per 24 hour  Intake 8954.28 ml  Output 2000 ml  Net 6954.28 ml   Filed Weights   05/30/2020 1316 06/04/2020 2320 06/11/20 1430  Weight: 102.1 kg 107.2 kg 110.3 kg   Examination: General: Critically ill-appearing woman lying in bed intubated, not sedated Neuro: Remains nonresponsive to verbal or physical stimulation, grimaces on abdominal exam. HEENT: [place]/AT, eyes anicteric, pupils  large and reactive.  Cracked dry lips with bleeding, small amount of blood in her mouth without another obvious source of injury. Cardiovascular: Regular rhythm, regular rate Lungs: Breathing comfortably on the vent, breathing above the set rate.  Clear to auscultation bilaterally.  Minimal thin clear secretions from ET tube Abdomen: Distended upper abdomen, wound VAC in place with thin bloody drainage.  Tender to palpation, tympanic on the left. Musculoskeletal: Bandaged lower extremity wounds from skin grafts-not examined.  Blisters on right medial thigh. Skin: Bruising with blisters on her thigh, petechiae on chest.    Assessment & Plan:   Septic shock due to peritonitis from SBO w/ perf. SBO 2/2 newly diagnosed abdominal mass. Cultures negative to date. -Ongoing volume resuscitation, matching I/O -Continue cefepime, Flagyl, and anidulafungin -Back to the OR today.  Appreciate surgery's assistance.-monitor-continue to wound vac output- need to volume resuscitate then match I/Os  -NGT to suction  Acute respiratory failure with hypoxia requiring MV -mental status precludes extubation -Fentanyl as needed for pain control and to tolerate mechanical ventilation -Continue lung protective ventilation, titrate PEEP and FiO2 per ARDS protocol. -Mental status and metabolic milieu currently preclude extubation.  Daily SAT and SBT when appropriate.    AKI on CKD stage III, pre-renal - due to poor oral intake past 2 weeks at presentation. Now volume down from SBO, sepsis, high ongoing abdominal losses Lactic acidosis due to sepsis Hypokalemia, improved Hypomagnesemia-resolved Hypophosphatemia-resolved hypernatremia-resolved -strict I's/O -Renally dose meds and avoid nephrotoxic medications -Serial lactic acid levels-concern for ongoing intra-abdominal sources that are uncontrolled. -Decrease D5W to 50 cc/h to prevent recurrent hypernatremia while on sodium bicarb -Continue sodium bicarb  infusion  Coagulopathy due to sepsis Thrombocytopenia due to sepsis -Continue to monitor -2 units FFP, 2 units of platelets prior to OR  Chronic venous stasis and ulcers (followed by Dr. Sharol Given) -con't wound care -Appreciate orthopedic surgery's input-staples removed 6/25  Protein malnutrition with severe hypoalbuminemia  -When more stable will likely require TPN during bowel rest -High risk for refeeding syndrome given prolonged reduced p.o. 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  Ascitic fluid cytology negative. -Appreciate surgery's input -Pathology pending  Best practice:  Diet:  NPO. Pain/Anxiety/Delirium protocol (if indicated): n/a VAP protocol (if indicated): yes DVT prophylaxis:  On hold fo rOR GI prophylaxis: n/a Mobility:  Bedrest   Code Status: Full Family Communication: Sister and daughter have been updated at bedside daily Disposition: ICU   This patient is critically ill with multiple organ system failure which requires frequent high complexity decision making, assessment,  support, evaluation, and titration of therapies. This was completed through the application of advanced monitoring technologies and extensive interpretation of multiple databases. During this encounter critical care time was devoted to patient care services described in this note for 40 minutes.  Julian Hy, DO 06/03/2020 8:39 AM  [facility]

## 2020-06-17 NOTE — Transfer of Care (Signed)
Immediate Anesthesia Transfer of Care Note  Patient: Kari [name]  Procedure(s) Performed: EXPLORATORY LAPAROTOMY (N/A Abdomen) PARTIAL COLECTOMY (N/A Abdomen) SMALL BOWEL RESECTION (N/A Abdomen) Left salpingo-oophorectomy (Left Abdomen)  Patient Location: ICU  Anesthesia Type:General  Level of Consciousness: Patient remains intubated per anesthesia plan, patient without response to stimulation and left on no sedation in ICU as patient was previously on no sedation.  Patient spontaneously initiating breaths.  Airway & Oxygen Therapy: Patient remains intubated per anesthesia plan and Patient placed on Ventilator (see vital sign flow sheet for setting)  Post-op Assessment: Report given to RN and Post -op Vital signs reviewed and stable  Post vital signs: Reviewed and stable  Last Vitals:  Vitals Value Taken Time  BP 123/72 05/27/2020 1157  Temp    Pulse 77 06/16/2020 1157  Resp 23 06/19/2020 1157  SpO2 100 % 05/27/2020 1157    Last Pain:  Vitals:   06/07/2020 0915  TempSrc: Axillary  PainSc:       Patients Stated Pain Goal:  (nausea) (13/24/40 1027)  Complications: No complications documented.

## 2020-06-17 NOTE — Progress Notes (Signed)
eLink Physician-Brief Progress Note Patient Name: Kari [name] DOB: [birth year] MRN: [id number]   Date of Service  05/29/2020  HPI/Events of Note  Hypertension - BP = 91/63 on Norepinephrine IV infusion at 60 mcg/min. Hgb = 5.8. Patient is being transfused.   eICU Interventions  Plan: 1. Check H/H post transfusion.  2. ABG STAT. 3. Monitor CVP as ordered.  4. Vasopressin IV infusion at shock dose.      Intervention Category Major Interventions: Hypotension - evaluation and management  [name] 06/12/2020, 9:13 PM

## 2020-06-17 NOTE — Progress Notes (Addendum)
2 Days Post-Op   Subjective/Chief Complaint: Intubated and sedated   Objective: Vital signs in last 24 hours: Temp:  [96.9 F (36.1 C)-99.9 F (37.7 C)] 96.9 F (36.1 C) (06/25 0700) Pulse Rate:  [30-100] 82 (06/25 0805) Resp:  [14-33] 18 (06/25 0645) BP: (82-128)/(53-100) 120/61 (06/25 0805) SpO2:  [97 %-100 %] 100 % (06/25 0645) Arterial Line BP: (72-129)/(45-73) 117/58 (06/25 0645) FiO2 (%):  [30 %] 30 % (06/25 0806) Last BM Date: 06/14/20  Intake/Output from previous day: 06/24 0701 - 06/25 0700 In: 9415.7 [I.V.:6203.8; IV Piggyback:3211.9] Out: 2000 [Urine:750; Drains:1250] Intake/Output this shift: No intake/output data recorded.  General appearance: sedated on vent Resp: clear to auscultation bilaterally Cardio: regular rate and rhythm GI: vac in place  Lab Results:  Recent Labs    06/16/20 2331 06/16/2020 0325  WBC 19.4* 18.0*  HGB 9.4* 7.9*  HCT 27.2* 23.5*  PLT 47* 36*   BMET Recent Labs    06/16/20 2331 05/31/2020 0325  NA 144 143  K 3.6 3.5  CL 103 101  CO2 16* 19*  GLUCOSE 216* 203*  BUN 73* 71*  CREATININE 2.81* 2.74*  CALCIUM 6.6* 6.6*   PT/INR Recent Labs    06/22/2020 1857 06/08/2020 0325  LABPROT 19.8* 25.6*  INR 1.7* 2.4*   ABG Recent Labs    06/13/2020 1418 05/24/2020 1845  PHART 7.413 7.427  HCO3 15.0* 17.0*    Studies/Results: No results found.  Anti-infectives: Anti-infectives (From admission, onward)   Start     Dose/Rate Route Frequency Ordered Stop   06/21/2020 1200  anidulafungin (ERAXIS) 100 mg in sodium chloride 0.9 % 100 mL IVPB     Discontinue    "Followed by" Linked Group Details   100 mg 78 mL/hr over 100 Minutes Intravenous Every 24 hours 06/16/20 1051     06/16/20 2200  ceFEPIme (MAXIPIME) 2 g in sodium chloride 0.9 % 100 mL IVPB  Status:  Discontinued        2 g 200 mL/hr over 30 Minutes Intravenous Every 24 hours 06/01/2020 1033 06/16/20 1141   06/16/20 1200  anidulafungin (ERAXIS) 200 mg in sodium chloride 0.9  % 200 mL IVPB       "Followed by" Linked Group Details   200 mg 78 mL/hr over 200 Minutes Intravenous  Once 06/16/20 1051 06/16/20 1308   06/16/20 1200  ceFEPIme (MAXIPIME) 2 g in sodium chloride 0.9 % 100 mL IVPB     Discontinue     2 g 200 mL/hr over 30 Minutes Intravenous Every 24 hours 06/16/20 1141     06/11/2020 1630  metroNIDAZOLE (FLAGYL) IVPB 500 mg     Discontinue     500 mg 100 mL/hr over 60 Minutes Intravenous Every 8 hours 05/27/2020 1615     06/14/20 1230  metroNIDAZOLE (FLAGYL) IVPB 500 mg  Status:  Discontinued        500 mg 100 mL/hr over 60 Minutes Intravenous Every 8 hours 06/14/20 1142 06/07/2020 1615   06/14/20 1100  ceFEPIme (MAXIPIME) 2 g in sodium chloride 0.9 % 100 mL IVPB  Status:  Discontinued        2 g 200 mL/hr over 30 Minutes Intravenous Every 12 hours 06/14/20 1003 05/28/2020 1033   06/13/20 1030  cefTRIAXone (ROCEPHIN) 2 g in sodium chloride 0.9 % 100 mL IVPB  Status:  Discontinued        2 g 200 mL/hr over 30 Minutes Intravenous Every 24 hours 06/13/20 0933 06/14/20 1003   06/11/20  1500  vancomycin (VANCOREADY) IVPB 1250 mg/250 mL  Status:  Discontinued        1,250 mg 166.7 mL/hr over 90 Minutes Intravenous Every 24 hours 06/07/2020 1815 06/13/20 0933   06/11/20 0300  ceFEPIme (MAXIPIME) 2 g in sodium chloride 0.9 % 100 mL IVPB  Status:  Discontinued        2 g 200 mL/hr over 30 Minutes Intravenous Every 12 hours 05/28/2020 1815 06/13/20 0933   06/11/2020 1400  vancomycin (VANCOREADY) IVPB 2000 mg/400 mL        2,000 mg 200 mL/hr over 120 Minutes Intravenous  Once 05/24/2020 1347 06/08/2020 1739   06/07/2020 1345  vancomycin (VANCOCIN) IVPB 1000 mg/200 mL premix  Status:  Discontinued        1,000 mg 200 mL/hr over 60 Minutes Intravenous  Once 06/12/2020 1333 06/03/2020 1347   06/03/2020 1345  ceFEPIme (MAXIPIME) 2 g in sodium chloride 0.9 % 100 mL IVPB        2 g 200 mL/hr over 30 Minutes Intravenous  Once 05/28/2020 1333 05/31/2020 1733      Assessment/Plan: s/p  Procedure(s): EXPLORATORY LAPAROTOMY (N/A) SMALL BOWEL RESECTION (N/A) plan to return to OR today. will try to put intestine back in continuity. Will have GYN look at cyst Will give plts and blood prior to starting  LOS: 7 days    Autumn Messing III 06/07/2020

## 2020-06-17 NOTE — Progress Notes (Signed)
CRITICAL VALUE ALERT  Critical Value:  Lactic >11  Date & Time Notied:  6.24.21 2300  Provider Notified: Warren Lacy  Orders Received/Actions taken: Yes

## 2020-06-17 NOTE — Progress Notes (Addendum)
Inpatient Diabetes Program Recommendations  AACE/ADA: New Consensus Statement on Inpatient Glycemic Control (2015)  Target Ranges:  Prepandial:   less than 140 mg/dL      Peak postprandial:   less than 180 mg/dL (1-2 hours)      Critically ill patients:  140 - 180 mg/dL   Lab Results  Component Value Date   GLUCAP 188 (H) 06/01/2020   HGBA1C 5.8 (H) 08/20/2019    Review of Glycemic Control Results for [name], Kari [name] (MRN [id number]) as of 06/10/2020 09:28  Ref. Range 06/16/2020 07:41 06/16/2020 11:11 06/16/2020 15:37 06/16/2020 19:20 06/16/2020 23:24 06/12/2020 03:26 06/02/2020 07:43  Glucose-Capillary Latest Ref Range: 70 - 99 mg/dL 132 (H) 116 (H) 175 (H) 201 (H) 204 (H) 194 (H) 188 (H)   Diabetes history: Pre DM  Current orders for Inpatient glycemic control:  Novolog 1-3 units Q4 hours  Inpatient Diabetes Program Recommendations:    Increase Novolog to 2-6 units Q4 hours.  Thanks,  Tama Headings RN, MSN, BC-ADM Inpatient Diabetes Coordinator Team Pager [phone] (8a-5p)

## 2020-06-17 NOTE — Op Note (Signed)
[facility] PROCEDURE DATE: 06/16/2020  PREOPERATIVE DIAGNOSES: ovarian cyst  POSTOPERATIVE DIAGNOSES: same  PROCEDURE: left salpingo-oophorectomy with endometrioma drainage  SURGEON:  Feliz Beam, MD  ASSISTANT:  Silas Sacramento, MD, Autumn Messing, MD  An experienced assistant was required given the standard of surgical care given the complexity of the case.  This assistant was needed for exposure, dissection, suctioning, retraction, instrument exchange, and for overall help during the procedure.  ANESTHESIOLOGY TEAM: Anesthesiologist: Murvin Natal, MD CRNA: Colin Benton, CRNA  INDICATIONS: Kari [name] is a 59 y.o. here for exploratory laparotomy and repair by general surgery, plan for bilateral salpingo-oophorectomy concurrently. Risks were discussed with patient's daughter as pt was encephalopathic and unable to consent: bleeding which may require transfusion or reoperation; infection which may require antibiotics; injury to bowel, bladder, ureters or other surrounding organs; need for additional procedures, incisional problems, and other postoperative/anesthesia complications. Daughter verbalizes understanding of plan, gave verbal consent.  FINDINGS: Massively enlarged left ovary with large endometrioma/cyst filled with brown fluid, omental and posterior wall adhesion to left ovary. Right tube and ovary visualized, adhered densely to right pelvic sidewall but otherwise normal appearing, uterus small and adhered to bowel, otherwise unremarkable  ANESTHESIA: General INTRAVENOUS FLUIDS: 3400 ml   ESTIMATED BLOOD LOSS: 5 mL for this portion of the case URINE OUTPUT:  250 ml SPECIMENS: Specimen sent to pathology COMPLICATIONS: None immediate  PROCEDURE IN DETAIL:  The patient preoperatively received intravenous antibiotics and had sequential compression devices applied to her lower extremities.  She was then taken to the operating room where ET tube as already in place and  she was placed under general anesthesia. She was then placed in a dorsal supine position, foley catheter already in place. She was then prepped and draped in a sterile manner.  After a timeout was performed, Dr. Marlou Starks removed the wound vac in the vertical midline incision which provided access to her abdomen. The left ovary/cyst was easily visualized. Omental adhesions to the left ovary were taken down by Dr. Marlou Starks with a Ligasure device until the anterior ovary was freed. The cyst was incised at this point and drained of a large amount of brown fluid. After complete drainage, the posterior adhesions were easily visualized and taken down with the Ligasure device. Once the ovary was freed in its entirety and mobile, I used the Ligasure device to ligate and transect the left IP ligament, freeing the ovary completely. Inspection of the pedicle showed no bleeding. The uterus, right ovary and fallopian tube were densely adhered to bowel and pelvic sidewall and decision made not to proceed with bilateral salpingo-oophorectomy. At this point, my portion of the procedure was completed and I turned the case over to Dr. Marlou Starks for repair of her intestine/colon.    Feliz Beam, M.D. Attending [place], [facility] for [employer], [place]

## 2020-06-17 NOTE — Op Note (Signed)
06/16/2020  11:31 AM  PATIENT:  Kari [name]  59 y.o. female  PRE-OPERATIVE DIAGNOSIS:  OPEN ABDOMEN  POST-OPERATIVE DIAGNOSIS:  OPEN ABDOMEN  PROCEDURE:  Procedure(s): EXPLORATORY LAPAROTOMY (N/A) PARTIAL COLECTOMY (N/A) SMALL BOWEL RESECTION (N/A)  SURGEON:  Surgeon(s) and Role: Panel 1:    Jovita Kussmaul, MD - Primary  PHYSICIAN ASSISTANT:   ASSISTANTS: Barkley Boards, PA   ANESTHESIA:   general  EBL:  100 mL   BLOOD ADMINISTERED:2 units PRBC  DRAINS: none   LOCAL MEDICATIONS USED:  NONE  SPECIMEN:  Source of Specimen:  distal small bowel and hepatic flexure of colon  DISPOSITION OF SPECIMEN:  PATHOLOGY  COUNTS:  YES  TOURNIQUET:  * No tourniquets in log *  DICTATION: .Dragon Dictation   After informed consent was obtained the patient was brought to the operating room and placed in the supine position on the operating table.  After adequate induction of general anesthesia the patient's abdomen was prepped with Betadine and draped in usual sterile manner.  An appropriate timeout was performed.  The vacuum dressing was removed from the abdomen.  The abdomen was explored.  The distal small bowel where it was stapled off was very distended with some ischemic changes.  The gynecologists came in to inspect the ovarian cyst.  I was able to separate the omentum from the surface of the cyst sharply with the LigaSure.  Their portion of the operation will then be dictated separately by them.  They were able to remove the cyst which gave [place] much more room in the abdominal cavity to explore.  The staple line at the right colon also had some ischemic changes.  The rest of the small bowel and colon looked good and healthy.  At this point we decided to try to hook her back together.  I did resect the right colon to the hepatic flexure by taking down the mesentery sharply with the LigaSure and dividing the colon at the hepatic flexure with a GIA-75 stapler.  I then removed also  about another foot of the distal small bowel because of the ischemic changes.  I divided the small bowel with a single firing of a GIA-75 stapler and took down the mesentery with the LigaSure.  The remaining colon and small bowel appeared healthy.  They approximated each other easily.  I made a small opening on the antimesenteric side of each limb of colon and small intestine.  Each limb of a GIA-75 stapler was then placed down the appropriate limb of small bowel or colon, clamped, and fired thereby creating a nice widely patent enteroenterostomy.  The common opening was closed with a single firing of a TA 60 stapler.  The staple line was then imbricated with several 2-0 silk Lembert stitches as well as a 2-0 silk crotch stitch.  Once this was accomplished the anastomosis appeared to be patent and healthy.  It was placed back in the right upper quadrant.  The wound was then irrigated with copious amounts of saline.  At this point the abdomen was then closed with 2 running #1 double-stranded looped PDS sutures.  The skin was left open and the subcutaneous tissue was packed with a moistened Kerlix gauze.  Sterile dressings were applied.  The patient tolerated the procedure well.  The patient will remain intubated and be taken back to the ICU for further resuscitation measures.  PLAN OF CARE: Admit to inpatient   PATIENT DISPOSITION:  ICU - intubated and critically ill.  Delay start of Pharmacological VTE agent (>24hrs) due to surgical blood loss or risk of bleeding: no

## 2020-06-17 NOTE — Progress Notes (Signed)
The patient is a pleasant 59 year old woman well-known to our practice.  She is 5 months status post irrigation and debridement bilateral lower extremities with skin grafting.  We have been following her very closely in the office.  She has still had staples in place and as she can tolerate it in the office we had been removing them.   Focused examination of her lower extremities skin grafts to have retained staples however they look much better than they have probably secondary to continued elevation.  Healthy vascular wound bed There is no surrounding erythema or cellulitis.  Mild amount of serous drainage no foul odor  Discussed with Dr. Sharol Given prior to seeing patient.  He feels that given the patient's other significant medical issues at this point we can continue to remove the staples as we have been doing in our office.

## 2020-06-17 NOTE — Progress Notes (Signed)
eLink Physician-Brief Progress Note Patient Name: Kari [name] DOB: [birth year] MRN: [id number]   Date of Service  06/22/2020  HPI/Events of Note  Lactic acid 10.7  eICU Interventions  Albumin 5 %  500 ml iv x 1        Yolinda Duerr U Anhar Mcdermott 06/21/2020, 1:25 AM

## 2020-06-17 NOTE — Progress Notes (Signed)
Late Entry   Gynecology Progress Note  Admission Date: 06/11/2020 Current Date: 06/14/2020 10:50 AM  Kari [name] is a 59 y.o.  HD#8 admitted for sepsis, found to have SBO with perforation on ex lap by general surgery 06/15/20. 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 Abdomen left open for return to OR today. Gyn consulted for possible removal of large, stable ovarian cyst concurrently.   Assessment & Plan:   Patient is 59 y.o. with large ovarian cyst. Reviewed recommendation with family to remove ovary/cyst and plan for bilateral salpingo-oophorectomy given age. Reviewed risks/benefits, they were agreeable with plan, consent signed via telephone.   To OR with gen surg today     Feliz Beam, M.D. Attending Center for [employer] farm manager)

## 2020-06-17 NOTE — Plan of Care (Signed)

## 2020-06-17 NOTE — Progress Notes (Signed)
Hgb 7.9 and plts 36K.  Plan to return to OR today.  Will give 2 packs of plts and 1 unit of RBCs prior to surgery today.  Kari [name] 8:04 AM 05/30/2020

## 2020-06-17 NOTE — Anesthesia Postprocedure Evaluation (Signed)
Anesthesia Post Note  Patient: KANYON BUNN  Procedure(s) Performed: EXPLORATORY LAPAROTOMY (N/A Abdomen) PARTIAL COLECTOMY (N/A Abdomen) SMALL BOWEL RESECTION (N/A Abdomen) Left salpingo-oophorectomy (Left Abdomen)     Patient location during evaluation: ICU Anesthesia Type: General Level of consciousness: sedated Pain management: pain level controlled Vital Signs Assessment: post-procedure vital signs reviewed and stable Respiratory status: patient remains intubated per anesthesia plan Cardiovascular status: stable Postop Assessment: no apparent nausea or vomiting Anesthetic complications: no   No complications documented.  Last Vitals:  Vitals:   06/05/2020 0945 06/02/2020 1157  BP:  123/72  Pulse:  77  Resp: (!) 21 (!) 23  Temp:    SpO2:  100%    Last Pain:  Vitals:   06/09/2020 0915  TempSrc: Axillary  PainSc:                  Karyl Kinnier Yailyn Strack

## 2020-06-17 NOTE — Anesthesia Preprocedure Evaluation (Addendum)
Anesthesia Evaluation  Patient identified by MRN, date of birth, ID band Patient unresponsive    Reviewed: Allergy & Precautions, NPO status , Patient's Chart, lab work & pertinent test results, Unable to perform ROS - Chart review only  Airway Mallampati: Intubated       Dental   Pulmonary asthma ,  Intubated      + intubated    Cardiovascular hypertension, Pt. on home beta blockers + CAD, + Past MI and +CHF  Normal cardiovascular exam+ Cardiac Defibrillator   ECG: rate 103   Neuro/Psych  Headaches, PSYCHIATRIC DISORDERS Depression    GI/Hepatic negative GI ROS, Neg liver ROS,   Endo/Other  negative endocrine ROS  Renal/GU CRFRenal disease     Musculoskeletal Gout   Abdominal (+) + obese,   Peds  Hematology  (+) anemia , Thrombocytopenia  Sepsis   Anesthesia Other Findings OPEN ABDOMEN  Reproductive/Obstetrics                            Anesthesia Physical Anesthesia Plan  ASA: IV  Anesthesia Plan: General   Post-op Pain Management:    Induction: Intravenous and Inhalational  PONV Risk Score and Plan: 3 and Midazolam and Treatment may vary due to age or medical condition  Airway Management Planned: Oral ETT  Additional Equipment:   Intra-op Plan:   Post-operative Plan: Post-operative intubation/ventilation  Informed Consent: I have reviewed the patients History and Physical, chart, labs and discussed the procedure including the risks, benefits and alternatives for the proposed anesthesia with the patient or authorized representative who has indicated his/her understanding and acceptance.     Consent reviewed with POA  Plan Discussed with: CRNA  Anesthesia Plan Comments: (Anesthetic plan discussed with daughter via telephone)      Anesthesia Quick Evaluation

## 2020-06-18 ENCOUNTER — Encounter (HOSPITAL_COMMUNITY): Admission: AD | Disposition: E | Payer: Self-pay | Source: Home / Self Care | Attending: Critical Care Medicine

## 2020-06-18 ENCOUNTER — Inpatient Hospital Stay (HOSPITAL_COMMUNITY): Payer: Medicare Other | Admitting: Certified Registered"

## 2020-06-18 ENCOUNTER — Inpatient Hospital Stay (HOSPITAL_COMMUNITY): Payer: Medicare Other

## 2020-06-18 DIAGNOSIS — R578 Other shock: Secondary | ICD-10-CM

## 2020-06-18 DIAGNOSIS — C172 Malignant neoplasm of ileum: Secondary | ICD-10-CM

## 2020-06-18 DIAGNOSIS — D62 Acute posthemorrhagic anemia: Secondary | ICD-10-CM

## 2020-06-18 HISTORY — PX: LAPAROTOMY: SHX154

## 2020-06-18 LAB — CBC
HCT: 20.6 % — ABNORMAL LOW (ref 36.0–46.0)
HCT: 20.6 % — ABNORMAL LOW (ref 36.0–46.0)
Hemoglobin: 6.8 g/dL — CL (ref 12.0–15.0)
Hemoglobin: 7.1 g/dL — ABNORMAL LOW (ref 12.0–15.0)
MCH: 29.8 pg (ref 26.0–34.0)
MCH: 30.6 pg (ref 26.0–34.0)
MCHC: 33 g/dL (ref 30.0–36.0)
MCHC: 34.5 g/dL (ref 30.0–36.0)
MCV: 90.4 fL (ref 80.0–100.0)
MCV: 88.8 fL (ref 80.0–100.0)
Platelets: 13 10*3/uL — CL (ref 150–400)
Platelets: 9 10*3/uL — CL (ref 150–400)
RBC: 2.28 MIL/uL — ABNORMAL LOW (ref 3.87–5.11)
RBC: 2.32 MIL/uL — ABNORMAL LOW (ref 3.87–5.11)
RDW: 15.3 % (ref 11.5–15.5)
RDW: 15.5 % (ref 11.5–15.5)
WBC: 11.8 10*3/uL — ABNORMAL HIGH (ref 4.0–10.5)
WBC: 13.8 10*3/uL — ABNORMAL HIGH (ref 4.0–10.5)
nRBC: 0.3 % — ABNORMAL HIGH (ref 0.0–0.2)
nRBC: 0.2 % (ref 0.0–0.2)

## 2020-06-18 LAB — PREPARE PLATELET PHERESIS
Unit division: 0
Unit division: 0

## 2020-06-18 LAB — POCT I-STAT 7, (LYTES, BLD GAS, ICA,H+H)
Acid-base deficit: 7 mmol/L — ABNORMAL HIGH (ref 0.0–2.0)
Acid-base deficit: 3 mmol/L — ABNORMAL HIGH (ref 0.0–2.0)
Acid-base deficit: 14 mmol/L — ABNORMAL HIGH (ref 0.0–2.0)
Bicarbonate: 16.2 mmol/L — ABNORMAL LOW (ref 20.0–28.0)
Bicarbonate: 22.3 mmol/L (ref 20.0–28.0)
Bicarbonate: 14.2 mmol/L — ABNORMAL LOW (ref 20.0–28.0)
Calcium, Ion: 0.92 mmol/L — ABNORMAL LOW (ref 1.15–1.40)
Calcium, Ion: 0.84 mmol/L — CL (ref 1.15–1.40)
Calcium, Ion: <0.3 mmol/L — CL (ref 1.15–1.40)
HCT: 19 % — ABNORMAL LOW (ref 36.0–46.0)
HCT: 17 % — ABNORMAL LOW (ref 36.0–46.0)
HCT: 30 % — ABNORMAL LOW (ref 36.0–46.0)
Hemoglobin: 6.5 g/dL — CL (ref 12.0–15.0)
Hemoglobin: 5.8 g/dL — CL (ref 12.0–15.0)
Hemoglobin: 10.2 g/dL — ABNORMAL LOW (ref 12.0–15.0)
O2 Saturation: 99 %
O2 Saturation: 100 %
O2 Saturation: 100 %
Patient temperature: 35.5
Patient temperature: 35.7
Potassium: 3.4 mmol/L — ABNORMAL LOW (ref 3.5–5.1)
Potassium: 4 mmol/L (ref 3.5–5.1)
Potassium: 5.3 mmol/L — ABNORMAL HIGH (ref 3.5–5.1)
Sodium: 140 mmol/L (ref 135–145)
Sodium: 142 mmol/L (ref 135–145)
Sodium: 142 mmol/L (ref 135–145)
TCO2: 17 mmol/L — ABNORMAL LOW (ref 22–32)
TCO2: 23 mmol/L (ref 22–32)
TCO2: 15 mmol/L — ABNORMAL LOW (ref 22–32)
pCO2 arterial: 20.3 mmHg — ABNORMAL LOW (ref 32.0–48.0)
pCO2 arterial: 37.6 mmHg (ref 32.0–48.0)
pCO2 arterial: 40.6 mmHg (ref 32.0–48.0)
pH, Arterial: 7.503 — ABNORMAL HIGH (ref 7.350–7.450)
pH, Arterial: 7.375 (ref 7.350–7.450)
pH, Arterial: 7.152 — CL (ref 7.350–7.450)
pO2, Arterial: 130 mmHg — ABNORMAL HIGH (ref 83.0–108.0)
pO2, Arterial: 446 mmHg — ABNORMAL HIGH (ref 83.0–108.0)
pO2, Arterial: 234 mmHg — ABNORMAL HIGH (ref 83.0–108.0)

## 2020-06-18 LAB — BASIC METABOLIC PANEL
Anion gap: 26 — ABNORMAL HIGH (ref 5–15)
Anion gap: 31 — ABNORMAL HIGH (ref 5–15)
BUN: 65 mg/dL — ABNORMAL HIGH (ref 6–20)
BUN: 58 mg/dL — ABNORMAL HIGH (ref 6–20)
CO2: 16 mmol/L — ABNORMAL LOW (ref 22–32)
CO2: 17 mmol/L — ABNORMAL LOW (ref 22–32)
Calcium: 6.4 mg/dL — CL (ref 8.9–10.3)
Calcium: 6.8 mg/dL — ABNORMAL LOW (ref 8.9–10.3)
Chloride: 99 mmol/L (ref 98–111)
Chloride: 95 mmol/L — ABNORMAL LOW (ref 98–111)
Creatinine, Ser: 2.5 mg/dL — ABNORMAL HIGH (ref 0.44–1.00)
Creatinine, Ser: 2.41 mg/dL — ABNORMAL HIGH (ref 0.44–1.00)
GFR calc Af Amer: 24 mL/min — ABNORMAL LOW (ref >60)
GFR calc Af Amer: 25 mL/min — ABNORMAL LOW (ref >60)
GFR calc non Af Amer: 20 mL/min — ABNORMAL LOW (ref >60)
GFR calc non Af Amer: 21 mL/min — ABNORMAL LOW (ref >60)
Glucose, Bld: 185 mg/dL — ABNORMAL HIGH (ref 70–99)
Glucose, Bld: 149 mg/dL — ABNORMAL HIGH (ref 70–99)
Potassium: 3.7 mmol/L (ref 3.5–5.1)
Potassium: 3.8 mmol/L (ref 3.5–5.1)
Sodium: 141 mmol/L (ref 135–145)
Sodium: 143 mmol/L (ref 135–145)

## 2020-06-18 LAB — MAGNESIUM
Magnesium: 2 mg/dL (ref 1.7–2.4)
Magnesium: 1.9 mg/dL (ref 1.7–2.4)

## 2020-06-18 LAB — BPAM FFP
Blood Product Expiration Date: 202106292359
Blood Product Expiration Date: 202106302359
Blood Product Expiration Date: 202106302359
ISSUE DATE / TIME: 202106250841
ISSUE DATE / TIME: 202106250841
ISSUE DATE / TIME: 202106251747
Unit Type and Rh: 600
Unit Type and Rh: 6200
Unit Type and Rh: 5100

## 2020-06-18 LAB — BPAM CRYOPRECIPITATE
Blood Product Expiration Date: 202106251617
Blood Product Expiration Date: 202106251617
ISSUE DATE / TIME: 202106251051
ISSUE DATE / TIME: 202106251051
Unit Type and Rh: 5100
Unit Type and Rh: 5100

## 2020-06-18 LAB — COMPREHENSIVE METABOLIC PANEL
ALT: 14 U/L (ref 0–44)
AST: 25 U/L (ref 15–41)
Albumin: 1.6 g/dL — ABNORMAL LOW (ref 3.5–5.0)
Alkaline Phosphatase: 33 U/L — ABNORMAL LOW (ref 38–126)
Anion gap: 29 — ABNORMAL HIGH (ref 5–15)
BUN: 65 mg/dL — ABNORMAL HIGH (ref 6–20)
CO2: 14 mmol/L — ABNORMAL LOW (ref 22–32)
Calcium: 6.8 mg/dL — ABNORMAL LOW (ref 8.9–10.3)
Chloride: 99 mmol/L (ref 98–111)
Creatinine, Ser: 2.48 mg/dL — ABNORMAL HIGH (ref 0.44–1.00)
GFR calc Af Amer: 24 mL/min — ABNORMAL LOW (ref >60)
GFR calc non Af Amer: 21 mL/min — ABNORMAL LOW (ref >60)
Glucose, Bld: 156 mg/dL — ABNORMAL HIGH (ref 70–99)
Potassium: 3.5 mmol/L (ref 3.5–5.1)
Sodium: 142 mmol/L (ref 135–145)
Total Bilirubin: 1.4 mg/dL — ABNORMAL HIGH (ref 0.3–1.2)
Total Protein: <3 g/dL — ABNORMAL LOW (ref 6.5–8.1)

## 2020-06-18 LAB — PREPARE CRYOPRECIPITATE
Unit division: 0
Unit division: 0

## 2020-06-18 LAB — PREPARE FRESH FROZEN PLASMA
Unit division: 0
Unit division: 0

## 2020-06-18 LAB — CBC WITH DIFFERENTIAL/PLATELET
Abs Immature Granulocytes: 0.19 10*3/uL — ABNORMAL HIGH (ref 0.00–0.07)
Abs Immature Granulocytes: 0.33 10*3/uL — ABNORMAL HIGH (ref 0.00–0.07)
Basophils Absolute: 0 10*3/uL (ref 0.0–0.1)
Basophils Absolute: 0 10*3/uL (ref 0.0–0.1)
Basophils Relative: 0 %
Basophils Relative: 0 %
Eosinophils Absolute: 0 10*3/uL (ref 0.0–0.5)
Eosinophils Absolute: 0 10*3/uL (ref 0.0–0.5)
Eosinophils Relative: 0 %
Eosinophils Relative: 0 %
HCT: 22.7 % — ABNORMAL LOW (ref 36.0–46.0)
HCT: 19.8 % — ABNORMAL LOW (ref 36.0–46.0)
Hemoglobin: 7.8 g/dL — ABNORMAL LOW (ref 12.0–15.0)
Hemoglobin: 7 g/dL — ABNORMAL LOW (ref 12.0–15.0)
Immature Granulocytes: 2 %
Immature Granulocytes: 2 %
Lymphocytes Relative: 15 %
Lymphocytes Relative: 12 %
Lymphs Abs: 1.5 10*3/uL (ref 0.7–4.0)
Lymphs Abs: 1.7 10*3/uL (ref 0.7–4.0)
MCH: 30.2 pg (ref 26.0–34.0)
MCH: 30.8 pg (ref 26.0–34.0)
MCHC: 34.4 g/dL (ref 30.0–36.0)
MCHC: 35.4 g/dL (ref 30.0–36.0)
MCV: 88 fL (ref 80.0–100.0)
MCV: 87.2 fL (ref 80.0–100.0)
Monocytes Absolute: 0.4 10*3/uL (ref 0.1–1.0)
Monocytes Absolute: 0.7 10*3/uL (ref 0.1–1.0)
Monocytes Relative: 4 %
Monocytes Relative: 5 %
Neutro Abs: 7.8 10*3/uL — ABNORMAL HIGH (ref 1.7–7.7)
Neutro Abs: 11.6 10*3/uL — ABNORMAL HIGH (ref 1.7–7.7)
Neutrophils Relative %: 79 %
Neutrophils Relative %: 81 %
Platelets: 63 10*3/uL — ABNORMAL LOW (ref 150–400)
Platelets: 44 10*3/uL — ABNORMAL LOW (ref 150–400)
RBC: 2.58 MIL/uL — ABNORMAL LOW (ref 3.87–5.11)
RBC: 2.27 MIL/uL — ABNORMAL LOW (ref 3.87–5.11)
RDW: 14.5 % (ref 11.5–15.5)
RDW: 14.6 % (ref 11.5–15.5)
WBC: 10 10*3/uL (ref 4.0–10.5)
WBC: 14.3 10*3/uL — ABNORMAL HIGH (ref 4.0–10.5)
nRBC: 0 % (ref 0.0–0.2)
nRBC: 0.3 % — ABNORMAL HIGH (ref 0.0–0.2)

## 2020-06-18 LAB — GLOBAL TEG PANEL
CFF Max Amplitude: 16.9 mm (ref 15–32)
CK with Heparinase (R): 7.7 min (ref 4.3–8.3)
Citrated Functional Fibrinogen: 308.4 mg/dL (ref 278–581)
Citrated Kaolin (K): 2.4 min — ABNORMAL HIGH (ref 0.8–2.1)
Citrated Kaolin (MA): 50.9 mm — ABNORMAL LOW (ref 52–69)
Citrated Kaolin (R): 9.8 min — ABNORMAL HIGH (ref 4.6–9.1)
Citrated Kaolin Angle: 63.7 deg (ref 63–78)
Citrated Rapid TEG (MA): 47.1 mm — ABNORMAL LOW (ref 52–70)

## 2020-06-18 LAB — GLUCOSE, CAPILLARY
Glucose-Capillary: 135 mg/dL — ABNORMAL HIGH (ref 70–99)
Glucose-Capillary: 151 mg/dL — ABNORMAL HIGH (ref 70–99)
Glucose-Capillary: 160 mg/dL — ABNORMAL HIGH (ref 70–99)
Glucose-Capillary: 175 mg/dL — ABNORMAL HIGH (ref 70–99)

## 2020-06-18 LAB — PROTIME-INR
INR: 2.1 — ABNORMAL HIGH (ref 0.8–1.2)
INR: 1.5 — ABNORMAL HIGH (ref 0.8–1.2)
Prothrombin Time: 22.9 seconds — ABNORMAL HIGH (ref 11.4–15.2)
Prothrombin Time: 17.1 seconds — ABNORMAL HIGH (ref 11.4–15.2)

## 2020-06-18 LAB — BPAM PLATELET PHERESIS
Blood Product Expiration Date: 202106272359
Blood Product Expiration Date: 202106272359
ISSUE DATE / TIME: 202106250841
ISSUE DATE / TIME: 202106250841
Unit Type and Rh: 5100
Unit Type and Rh: 6200

## 2020-06-18 LAB — PHOSPHORUS
Phosphorus: 3.5 mg/dL (ref 2.5–4.6)
Phosphorus: 3.8 mg/dL (ref 2.5–4.6)
Phosphorus: 5.5 mg/dL — ABNORMAL HIGH (ref 2.5–4.6)

## 2020-06-18 LAB — LACTIC ACID, PLASMA
Lactic Acid, Venous: >11 mmol/L (ref 0.5–1.9)
Lactic Acid, Venous: >11 mmol/L (ref 0.5–1.9)
Lactic Acid, Venous: >11 mmol/L (ref 0.5–1.9)

## 2020-06-18 LAB — PREPARE RBC (CROSSMATCH)

## 2020-06-18 LAB — TECHNOLOGIST SMEAR REVIEW: Plt Morphology: NONE SEEN

## 2020-06-18 LAB — FIBRINOGEN
Fibrinogen: 157 mg/dL — ABNORMAL LOW (ref 210–475)
Fibrinogen: 203 mg/dL — ABNORMAL LOW (ref 210–475)

## 2020-06-18 SURGERY — LAPAROTOMY, EXPLORATORY
Anesthesia: General | Site: Abdomen

## 2020-06-18 MED ORDER — PROPOFOL 10 MG/ML IV BOLUS
INTRAVENOUS | Status: AC
  Filled 2020-06-18: qty 20

## 2020-06-18 MED ORDER — CALCIUM GLUCONATE-NACL 2-0.675 GM/100ML-% IV SOLN
2.0000 g | INTRAVENOUS | Status: AC
  Administered 2020-06-18: 2000 mg via INTRAVENOUS
  Filled 2020-06-18: qty 100

## 2020-06-18 MED ORDER — EPINEPHRINE 1 MG/10ML IJ SOSY
PREFILLED_SYRINGE | INTRAMUSCULAR | Status: AC
  Filled 2020-06-18: qty 20

## 2020-06-18 MED ORDER — EPINEPHRINE 1 MG/10ML IJ SOSY
PREFILLED_SYRINGE | INTRAMUSCULAR | Status: AC
  Filled 2020-06-18: qty 10

## 2020-06-18 MED ORDER — SODIUM BICARBONATE 8.4 % IV SOLN
INTRAVENOUS | Status: DC | PRN
  Administered 2020-06-18: 50 meq via INTRAVENOUS

## 2020-06-18 MED ORDER — SODIUM CHLORIDE 0.9% IV SOLUTION: Freq: Once | INTRAVENOUS | Status: AC

## 2020-06-18 MED ORDER — HEMOSTATIC AGENTS (NO CHARGE) OPTIME
TOPICAL | Status: DC | PRN
  Administered 2020-06-18: 1 via TOPICAL

## 2020-06-18 MED ORDER — CALCIUM CHLORIDE 10 % IV SOLN
INTRAVENOUS | Status: DC | PRN
Start: 2020-06-18 — End: 2020-06-18
  Administered 2020-06-18: 1 g via INTRAVENOUS

## 2020-06-18 MED ORDER — VASOPRESSIN 20 UNIT/ML IV SOLN
INTRAVENOUS | Status: DC | PRN
Start: 2020-06-18 — End: 2020-06-18
  Administered 2020-06-18 (×2): 4 [IU] via INTRAVENOUS
  Administered 2020-06-18: 2 [IU] via INTRAVENOUS

## 2020-06-18 MED ORDER — SODIUM CHLORIDE 0.9% FLUSH
10.0000 mL | Freq: Two times a day (BID) | INTRAVENOUS | Status: DC
  Administered 2020-06-18: 20 mL

## 2020-06-18 MED ORDER — ROCURONIUM BROMIDE 100 MG/10ML IV SOLN
INTRAVENOUS | Status: DC | PRN
Start: 2020-06-18 — End: 2020-06-18
  Administered 2020-06-18: 50 mg via INTRAVENOUS

## 2020-06-18 MED ORDER — MIDAZOLAM HCL 2 MG/2ML IJ SOLN
INTRAMUSCULAR | Status: AC
  Filled 2020-06-18: qty 2

## 2020-06-18 MED ORDER — 0.9 % SODIUM CHLORIDE (POUR BTL) OPTIME
TOPICAL | Status: DC | PRN
  Administered 2020-06-18: 3000 mL

## 2020-06-18 MED ORDER — FENTANYL CITRATE (PF) 250 MCG/5ML IJ SOLN
INTRAMUSCULAR | Status: AC
  Filled 2020-06-18: qty 5

## 2020-06-18 MED ORDER — EPINEPHRINE 1 MG/10ML IJ SOSY
PREFILLED_SYRINGE | INTRAMUSCULAR | Status: DC | PRN
  Administered 2020-06-18 (×3): .2 mg via INTRAVENOUS

## 2020-06-18 MED ORDER — EPINEPHRINE PF 1 MG/ML IJ SOLN
INTRAVENOUS | Status: DC | PRN
  Administered 2020-06-18: 8 ug/min via INTRAVENOUS

## 2020-06-18 MED ORDER — PROPOFOL 10 MG/ML IV BOLUS
INTRAVENOUS | Status: DC | PRN
Start: 2020-06-18 — End: 2020-06-18
  Administered 2020-06-18: 30 mg via INTRAVENOUS

## 2020-06-18 MED ORDER — SODIUM CHLORIDE 0.9% FLUSH: 10.0000 mL | INTRAVENOUS | Status: DC | PRN

## 2020-06-18 MED ORDER — SODIUM CHLORIDE 0.9 % IV SOLN
INTRAVENOUS | Status: DC | PRN
Start: 2020-06-18 — End: 2020-06-18

## 2020-06-18 MED ORDER — CALCIUM GLUCONATE-NACL 2-0.675 GM/100ML-% IV SOLN
2.0000 g | Freq: Once | INTRAVENOUS | Status: AC
  Administered 2020-06-18: 2000 mg via INTRAVENOUS
  Filled 2020-06-18: qty 100

## 2020-06-18 MED ORDER — VASOPRESSIN 20 UNIT/ML IV SOLN
INTRAVENOUS | Status: AC
  Filled 2020-06-18: qty 1

## 2020-06-18 MED ORDER — SODIUM CHLORIDE 0.9 % IV BOLUS
1000.0000 mL | Freq: Once | INTRAVENOUS | Status: AC
  Administered 2020-06-18: 1000 mL via INTRAVENOUS

## 2020-06-18 MED ORDER — MAGNESIUM SULFATE IN D5W 1-5 GM/100ML-% IV SOLN
1.0000 g | Freq: Once | INTRAVENOUS | Status: AC
  Administered 2020-06-18: 1 g via INTRAVENOUS
  Filled 2020-06-18: qty 100

## 2020-06-18 MED ORDER — SODIUM CHLORIDE 0.9% IV SOLUTION: Freq: Once | INTRAVENOUS | Status: DC

## 2020-06-18 MED ORDER — LIDOCAINE 2% (20 MG/ML) 5 ML SYRINGE
INTRAMUSCULAR | Status: AC
  Filled 2020-06-18: qty 5

## 2020-06-18 MED ORDER — POTASSIUM CHLORIDE 10 MEQ/50ML IV SOLN
10.0000 meq | INTRAVENOUS | Status: AC
  Administered 2020-06-18 (×6): 10 meq via INTRAVENOUS
  Filled 2020-06-18 (×6): qty 50

## 2020-06-18 MED ORDER — HYDROCORTISONE NA SUCCINATE PF 100 MG IJ SOLR
100.0000 mg | Freq: Three times a day (TID) | INTRAMUSCULAR | Status: DC
  Administered 2020-06-18: 100 mg via INTRAVENOUS
  Filled 2020-06-18: qty 2

## 2020-06-18 MED ORDER — IOHEXOL 350 MG/ML SOLN
100.0000 mL | Freq: Once | INTRAVENOUS | Status: AC | PRN
  Administered 2020-06-18: 100 mL via INTRAVENOUS

## 2020-06-18 MED ORDER — MIDAZOLAM HCL 5 MG/5ML IJ SOLN
INTRAMUSCULAR | Status: DC | PRN
Start: 2020-06-18 — End: 2020-06-18
  Administered 2020-06-18: 2 mg via INTRAVENOUS

## 2020-06-18 SURGICAL SUPPLY — 47 items
BLADE CLIPPER SURG (BLADE) IMPLANT
CANISTER SUCT 3000ML PPV (MISCELLANEOUS) IMPLANT
CHLORAPREP W/TINT 26 (MISCELLANEOUS) ×3 IMPLANT
COVER SURGICAL LIGHT HANDLE (MISCELLANEOUS) ×3 IMPLANT
COVER WAND RF STERILE (DRAPES) ×3 IMPLANT
DRAPE LAPAROSCOPIC ABDOMINAL (DRAPES) ×3 IMPLANT
DRAPE WARM FLUID 44X44 (DRAPES) ×3 IMPLANT
DRESSING QUICKCLOT CNTRL 12X12 (GAUZE/BANDAGES/DRESSINGS) ×1 IMPLANT
DRESSING QUICKCLOT CNTRL ZFOLD (GAUZE/BANDAGES/DRESSINGS) ×1 IMPLANT
DRSG OPSITE POSTOP 4X10 (GAUZE/BANDAGES/DRESSINGS) IMPLANT
DRSG OPSITE POSTOP 4X8 (GAUZE/BANDAGES/DRESSINGS) IMPLANT
DRSG QUICKCLOT CNTRL 12X12 (GAUZE/BANDAGES/DRESSINGS) ×3
DRSG QUICKCLOT CNTRL ZFOLD (GAUZE/BANDAGES/DRESSINGS) ×3
DRSG VAC ATS LRG SENSATRAC (GAUZE/BANDAGES/DRESSINGS) ×3 IMPLANT
ELECT BLADE 6.5 EXT (BLADE) ×3 IMPLANT
ELECT CAUTERY BLADE 6.4 (BLADE) ×3 IMPLANT
ELECT REM PT RETURN 9FT ADLT (ELECTROSURGICAL) ×3
ELECTRODE REM PT RTRN 9FT ADLT (ELECTROSURGICAL) ×1 IMPLANT
GLOVE BIO SURGEON STRL SZ7 (GLOVE) ×3 IMPLANT
GLOVE BIOGEL PI IND STRL 7.5 (GLOVE) ×1 IMPLANT
GLOVE BIOGEL PI INDICATOR 7.5 (GLOVE) ×2
GOWN STRL REUS W/ TWL LRG LVL3 (GOWN DISPOSABLE) ×3 IMPLANT
GOWN STRL REUS W/TWL LRG LVL3 (GOWN DISPOSABLE) ×9
HANDLE SUCTION POOLE (INSTRUMENTS) ×1 IMPLANT
KIT BASIN OR (CUSTOM PROCEDURE TRAY) ×3 IMPLANT
KIT TURNOVER KIT B (KITS) ×3 IMPLANT
LIGASURE IMPACT 36 18CM CVD LR (INSTRUMENTS) IMPLANT
NS IRRIG 1000ML POUR BTL (IV SOLUTION) ×9 IMPLANT
PACK GENERAL/GYN (CUSTOM PROCEDURE TRAY) ×3 IMPLANT
PAD ARMBOARD 7.5X6 YLW CONV (MISCELLANEOUS) ×3 IMPLANT
PENCIL SMOKE EVACUATOR (MISCELLANEOUS) ×3 IMPLANT
SPECIMEN JAR LARGE (MISCELLANEOUS) IMPLANT
SPONGE LAP 18X18 RF (DISPOSABLE) ×3 IMPLANT
STAPLER VISISTAT 35W (STAPLE) IMPLANT
SUCTION POOLE HANDLE (INSTRUMENTS) ×3
SUT PDS AB 1 TP1 96 (SUTURE) IMPLANT
SUT SILK 2 0 (SUTURE) ×3
SUT SILK 2 0 SH CR/8 (SUTURE) ×3 IMPLANT
SUT SILK 2-0 18XBRD TIE 12 (SUTURE) ×1 IMPLANT
SUT SILK 3 0 (SUTURE) ×3
SUT SILK 3 0 SH CR/8 (SUTURE) ×3 IMPLANT
SUT SILK 3-0 18XBRD TIE 12 (SUTURE) ×1 IMPLANT
SUT VIC AB 3-0 SH 27 (SUTURE)
SUT VIC AB 3-0 SH 27X BRD (SUTURE) IMPLANT
TOWEL GREEN STERILE (TOWEL DISPOSABLE) ×3 IMPLANT
TRAY FOLEY MTR SLVR 16FR STAT (SET/KITS/TRAYS/PACK) IMPLANT
YANKAUER SUCT BULB TIP NO VENT (SUCTIONS) IMPLANT

## 2020-06-19 LAB — TYPE AND SCREEN
ABO/RH(D): O POS
Antibody Screen: NEGATIVE
Unit division: 0
Unit division: 0
Unit division: 0
Unit division: 0
Unit division: 0
Unit division: 0
Unit division: 0
Unit division: 0
Unit division: 0
Unit division: 0
Unit division: 0
Unit division: 0
Unit division: 0
Unit division: 0
Unit division: 0
Unit division: 0
Unit division: 0
Unit division: 0
Unit division: 0
Unit division: 0
Unit division: 0
Unit division: 0
Unit division: 0
Unit division: 0
Unit division: 0
Unit division: 0
Unit division: 0
Unit division: 0
Unit division: 0

## 2020-06-19 LAB — BPAM RBC
Blood Product Expiration Date: 202107152359
Blood Product Expiration Date: 202107152359
Blood Product Expiration Date: 202107152359
Blood Product Expiration Date: 202107152359
Blood Product Expiration Date: 202107162359
Blood Product Expiration Date: 202107162359
Blood Product Expiration Date: 202107232359
Blood Product Expiration Date: 202107242359
Blood Product Expiration Date: 202107242359
Blood Product Expiration Date: 202107262359
Blood Product Expiration Date: 202107272359
Blood Product Expiration Date: 202107272359
Blood Product Expiration Date: 202107272359
Blood Product Expiration Date: 202107292359
Blood Product Expiration Date: 202107292359
Blood Product Expiration Date: 202107292359
Blood Product Expiration Date: 202107292359
Blood Product Expiration Date: 202107292359
Blood Product Expiration Date: 202107292359
Blood Product Expiration Date: 202107292359
Blood Product Expiration Date: 202107292359
Blood Product Expiration Date: 202107292359
Blood Product Expiration Date: 202107312359
Blood Product Expiration Date: 202107312359
Blood Product Expiration Date: 202107312359
Blood Product Expiration Date: 202107312359
Blood Product Expiration Date: 202107312359
Blood Product Expiration Date: 202107312359
Blood Product Expiration Date: 202107312359
ISSUE DATE / TIME: 202106231447
ISSUE DATE / TIME: 202106231447
ISSUE DATE / TIME: 202106231704
ISSUE DATE / TIME: 202106250841
ISSUE DATE / TIME: 202106251114
ISSUE DATE / TIME: 202106251425
ISSUE DATE / TIME: 202106252012
ISSUE DATE / TIME: 202106252012
ISSUE DATE / TIME: 202106260201
ISSUE DATE / TIME: 202106260713
ISSUE DATE / TIME: 202106260713
ISSUE DATE / TIME: 202106260713
ISSUE DATE / TIME: 202106260713
ISSUE DATE / TIME: 202106261406
ISSUE DATE / TIME: 202106261406
ISSUE DATE / TIME: 202106261406
ISSUE DATE / TIME: 202106261406
ISSUE DATE / TIME: 202106261410
ISSUE DATE / TIME: 202106261410
ISSUE DATE / TIME: 202106261410
ISSUE DATE / TIME: 202106261410
ISSUE DATE / TIME: 202106261512
ISSUE DATE / TIME: 202106261512
ISSUE DATE / TIME: 202106261512
ISSUE DATE / TIME: 202106261512
ISSUE DATE / TIME: 202106261539
ISSUE DATE / TIME: 202106261539
ISSUE DATE / TIME: 202106261539
ISSUE DATE / TIME: 202106261539
Unit Type and Rh: 5100
Unit Type and Rh: 5100
Unit Type and Rh: 5100
Unit Type and Rh: 5100
Unit Type and Rh: 5100
Unit Type and Rh: 5100
Unit Type and Rh: 5100
Unit Type and Rh: 5100
Unit Type and Rh: 5100
Unit Type and Rh: 5100
Unit Type and Rh: 5100
Unit Type and Rh: 5100
Unit Type and Rh: 5100
Unit Type and Rh: 5100
Unit Type and Rh: 5100
Unit Type and Rh: 5100
Unit Type and Rh: 5100
Unit Type and Rh: 5100
Unit Type and Rh: 5100
Unit Type and Rh: 5100
Unit Type and Rh: 5100
Unit Type and Rh: 5100
Unit Type and Rh: 5100
Unit Type and Rh: 5100
Unit Type and Rh: 5100
Unit Type and Rh: 5100
Unit Type and Rh: 5100
Unit Type and Rh: 5100
Unit Type and Rh: 5100

## 2020-06-19 LAB — BPAM FFP
Blood Product Expiration Date: 202107012359
Blood Product Expiration Date: 202107012359
Blood Product Expiration Date: 202107012359
Blood Product Expiration Date: 202107012359
Blood Product Expiration Date: 202106302359
Blood Product Expiration Date: 202106302359
Blood Product Expiration Date: 202106302359
Blood Product Expiration Date: 202106302359
Blood Product Expiration Date: 202107012359
Blood Product Expiration Date: 202107012359
Blood Product Expiration Date: 202107012359
Blood Product Expiration Date: 202107012359
Blood Product Expiration Date: 202107012359
Blood Product Expiration Date: 202107012359
Blood Product Expiration Date: 202107012359
Blood Product Expiration Date: 202107012359
Blood Product Expiration Date: 202107012359
Blood Product Expiration Date: 202107012359
Blood Product Expiration Date: 202107012359
Blood Product Expiration Date: 202107012359
ISSUE DATE / TIME: 202106260807
ISSUE DATE / TIME: 202106260807
ISSUE DATE / TIME: 202106260807
ISSUE DATE / TIME: 202106260807
ISSUE DATE / TIME: 202106261409
ISSUE DATE / TIME: 202106261409
ISSUE DATE / TIME: 202106261409
ISSUE DATE / TIME: 202106261409
ISSUE DATE / TIME: 202106261419
ISSUE DATE / TIME: 202106261419
ISSUE DATE / TIME: 202106261419
ISSUE DATE / TIME: 202106261419
ISSUE DATE / TIME: 202106261510
ISSUE DATE / TIME: 202106261510
ISSUE DATE / TIME: 202106261510
ISSUE DATE / TIME: 202106261510
ISSUE DATE / TIME: 202106261535
ISSUE DATE / TIME: 202106261535
ISSUE DATE / TIME: 202106261535
ISSUE DATE / TIME: 202106261535
Unit Type and Rh: 5100
Unit Type and Rh: 5100
Unit Type and Rh: 5100
Unit Type and Rh: 5100
Unit Type and Rh: 5100
Unit Type and Rh: 5100
Unit Type and Rh: 5100
Unit Type and Rh: 5100
Unit Type and Rh: 5100
Unit Type and Rh: 5100
Unit Type and Rh: 5100
Unit Type and Rh: 5100
Unit Type and Rh: 6200
Unit Type and Rh: 6200
Unit Type and Rh: 6200
Unit Type and Rh: 6200
Unit Type and Rh: 6200
Unit Type and Rh: 6200
Unit Type and Rh: 6200
Unit Type and Rh: 6200

## 2020-06-19 LAB — PREPARE PLATELET PHERESIS
Unit division: 0
Unit division: 0
Unit division: 0
Unit division: 0
Unit division: 0
Unit division: 0
Unit division: 0

## 2020-06-19 LAB — PREPARE FRESH FROZEN PLASMA
Unit division: 0
Unit division: 0
Unit division: 0
Unit division: 0
Unit division: 0
Unit division: 0
Unit division: 0
Unit division: 0
Unit division: 0
Unit division: 0
Unit division: 0
Unit division: 0
Unit division: 0
Unit division: 0

## 2020-06-19 LAB — BPAM PLATELET PHERESIS
Blood Product Expiration Date: 202106272359
Blood Product Expiration Date: 202106282359
Blood Product Expiration Date: 202106282359
Blood Product Expiration Date: 202106292359
Blood Product Expiration Date: 202106292359
Blood Product Expiration Date: 202106292359
Blood Product Expiration Date: 202106292359
ISSUE DATE / TIME: 202106260612
ISSUE DATE / TIME: 202106260733
ISSUE DATE / TIME: 202106260733
ISSUE DATE / TIME: 202106260733
ISSUE DATE / TIME: 202106260927
ISSUE DATE / TIME: 202106261414
ISSUE DATE / TIME: 202106261520
Unit Type and Rh: 7300
Unit Type and Rh: 5100
Unit Type and Rh: 6200
Unit Type and Rh: 7300
Unit Type and Rh: 8400
Unit Type and Rh: 6200
Unit Type and Rh: 6200

## 2020-06-19 LAB — PREPARE CRYOPRECIPITATE
Unit division: 0
Unit division: 0
Unit division: 0

## 2020-06-19 LAB — BPAM CRYOPRECIPITATE
Blood Product Expiration Date: 202106261953
Blood Product Expiration Date: 202106261953
Blood Product Expiration Date: 202106262120
ISSUE DATE / TIME: 202106261416
ISSUE DATE / TIME: 202106261416
ISSUE DATE / TIME: 202106261536
Unit Type and Rh: 5100
Unit Type and Rh: 5100
Unit Type and Rh: 5100

## 2020-06-20 ENCOUNTER — Encounter (HOSPITAL_COMMUNITY): Payer: Self-pay | Admitting: General Surgery

## 2020-06-20 LAB — SURGICAL PATHOLOGY

## 2020-06-20 NOTE — Addendum Note (Signed)
Addendum  created 06/20/20 0813 by Josephine Igo, CRNA   Order list changed

## 2020-06-20 NOTE — Addendum Note (Signed)
Addendum  created 06/20/20 0835 by Josephine Igo, CRNA   Order list changed

## 2020-06-21 LAB — MASSIVE TRANSFUSION PROTOCOL ORDER (BLOOD BANK NOTIFICATION)

## 2020-06-23 LAB — SURGICAL PATHOLOGY

## 2020-06-23 NOTE — Progress Notes (Addendum)
    1 Day Post-Op Procedure(s) (LRB): EXPLORATORY LAPAROTOMY (N/A) Left salpingo-oophorectomy (Left) PARTIAL COLECTOMY (N/A) SMALL BOWEL RESECTION (N/A)  Subjective: Patient is still intubated in ICU. Report obtained from RN.  Some bloody saturation of dressing noted. Patient also noted to have bowel movements; noticed while repositioning patient.   Objective: I have reviewed patient's vital signs, intake and output and medications.  General: morbidly obese and intubated Abdomen: incision: moderate bloody drainage present on dressing Vaginal Bleeding: none  CBC Latest Ref Rng & Units 07-07-20 2020/07/07 07-07-20  WBC 4.0 - 10.5 K/uL - 13.8(H) 11.8(H)  Hemoglobin 12.0 - 15.0 g/dL 6.5(LL) 7.1(L) 6.8(LL)  Hematocrit 36 - 46 % 19.0(L) 20.6(L) 20.6(L)  Platelets 150 - 400 K/uL - 9(LL) 13(LL)   Assessment: s/p Procedure(s): EXPLORATORY LAPAROTOMY (N/A) Left salpingo-oophorectomy (Left) PARTIAL COLECTOMY (N/A) SMALL BOWEL RESECTION (N/A): still critical, intubated in ICU.  Plan: Bleeding is unlikely to be coming from clamped off infundibulopelvic ligament; she had more extensive bowel surgery. Also worried about her coagulation status; high risk of coagulopathy. Recommend laboratory and possible imaging evaluation to elucidate source of bleeding. .  Will defer management to ICU and General Surgery; appreciate their care  If patient need further operative intervention, we will be happy to come in to examine the left IP pedicle. We will follow up pathology report once available. Will sign off for now, please contact us at [phone] for any questions   LOS: 8 days    Verita Schneiders, MD, [place], [facility] for [facility], [facility] 2020-07-07, 7:05 AM

## 2020-06-23 NOTE — Op Note (Signed)
Preoperative diagnosis: hemoperitoneum Postoperative diagnosis: Same as above Procedure: 1.  Exploratory laparotomy, reopening 2.  Abdominal VAC placement Surgeon: Dr. Serita Grammes Anesthesia: General Estimated blood loss: Greater than 5 L present upon opening Drains: None Complications: Death Specimens: None Disposition to morgue  Indications: This is a 59 year old female who underwent an exploratory laparotomy with resection of the terminal ileum and cecum and an abdominal wound VAC on 23 June.  She then returned to the operating room for a repeat laparotomy removal of an ovarian cyst, partial colectomy, small bowel resection and a primary anastomosis with abdominal closure.  She is postop day 1 from this.  Overnight she required pressors and became thrombocytopenic.  She required transfusions.  She was seen earlier in the day and then I was contacted by the critical care medicine physician Dr. Carlis Abbott.  Her concern was that she was in hemorrhagic shock.  A CT scan showed free blood in the abdomen.  I went to see the patient and discussed with her Sister Vaughan Basta going to the operating room urgently for hemorrhagic shock.  Procedure: After informed consent was obtained from the patient's sister she was returned to the operating room.  I activated the massive transfusion protocol.  She was placed under general anesthesia.  She had appropriate lines.  She was then prepped and draped in the standard sterile surgical fashion.  A surgical timeout was then performed.  I extended the incision.  Once anesthesia had the appropriate set up I then reopened the old laparotomy.  There was over 5 L of blood in her abdomen.  This was under pressure and spilled out very quickly.  I was then able to pack off the entire abdomen with sponges.  It appeared that almost every surface was bleeding at this point.  I then removed the sponges once anesthesia had caught up.  I then was able to identify the right upper quadrant  which also look like on her CT scan as a source of most of the bleeding.  At this point she was beginning to require more pressors and getting epinephrine.  There was not really one area that I could identify that was bleeding.  I elected to place a quick clot over this entire area and pack it with sponges.  I then placed an abdominal wound VAC.  She became progressively unresponsive to epinephrine and pressors as well as volume.  Her hemoglobin was actually 10 in the operating room.  A  TEG was done showing fibrinolysis among other things and she had received cryoprecipitate in the operating room.  She then progressed to PEA.  CPR was performed as well as ACLS protocols and we were unable to regain spontaneous circulation.  She was pronounced deceased at 40.

## 2020-06-23 NOTE — Death Summary Note (Signed)
Physician Discharge Summary  Patient ID: Kari [name] MRN: [id number] DOB/AGE: [birth year] 59 y.o.  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 Dehydration Venous stasis ulcers BLE AKI on CKD 3 Ovarian cyst  Discharge Diagnoses:  Active Problems:   Shock ([place])   SIRS (systemic inflammatory response syndrome) (HCC)   Abdominal distention   SBO (small bowel obstruction) (HCC)   NSVT (nonsustained ventricular tachycardia) (HCC)   Cyst of left ovary Multiorgan failure Septic shock 2/2 peritonitis from perforated small bowel Hemorrhagic shock Acute blood loss anemia Coagulopathy Severe thrombocytopenia AKI Lactic acidosis Acute metabolic encephalopathy Acute hypoxic respiratory failure requiring MV Colon adenocarcinoma Pelvic cystic tumor-endometrioma, benign Severe malnutrition  Chronic venous stasis ulcers BLE Chronic heart failure, 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 She was volume resuscitated with temporary stability prior to developing worsening abdominal pain and distention and recurrent shock.  Upon reinitiation of vasopressors had a central line placed, was volume resuscitated, started on antibiotics, and a CT abdomen pelvis was performed demonstrating small bowel obstruction.  NG tube was placed to low intermittent wall suction.  Surgery was consulted, who felt that she could be managed nonsurgically.  Despite NG tube decompression she developed worsening abdominal distention and a lactic acidosis.  She went to the operating room for an ex- lap which demonstrated perforation, distended ischemic small bowel and colon, and a large mass in the terminal ileum and cecum that was resected.  She had a  known large ovarian cyst in her pelvis.  She was brought back to the ICU with wound VAC and started on antifungals in addition to continued antibiotics.  She was resuscitated with blood products as required and additional IV fluids and sodium bicarbonate for persistent lactic acidosis.  She returned to the operating room 2 days later and had an additional flight of terminal ileum and an additional foot of her ascending colon resected for ischemia.  She had a primary anastomosis and had her left ovarian cyst removed with salpingo-oophorectomy.  Her abdomen was closed.  She returned to the ICU but gradually became unstable, responding initially to resuscitation.  Overnight she decompensated with escalating pressor requirements despite crystalloid and blood resuscitation.  She was given matched transfusions to stabilize her to have a CT scan performed which demonstrated a large hematoma in her abdomen.  By this time she had developed physical exam findings confirming bleeding.  Additional matched transfusions were given and the massive transfusion protocol was ordered surgery took her back to the OR in an effort to control the bleeding.  Due to ongoing instability she suffered a cardiac arrest despite aggressive resuscitation efforts expired in the operating room.  Her family have been updated by Dr. Donne Hazel and myself with the chaplain.  They are aware of her diagnosis of colon cancer and the need for appropriate screening of other family members.   Consults:  PCCM General surgery OB/Gyn Cardiology Orthopedic surgery   Significant Diagnostic Studies:   Pathology- adenocarcinoma Cytology ascitic fluid- no malignant cells  CT C/A/P 06-22-20: 1. Large hematoma situated in the right upper abdomen that measures up to 14 cm. There is evidence for active bleeding within the hematoma. Source of the bleeding is not clearly identified on this examination. Arterial structures are diffusely small and  compatible  with vaso constriction. 2. Postsurgical changes compatible with resection of the large ovarian cyst and partial bowel resection. The bowel distension has resolved since the surgery. Scattered pockets of free air throughout the abdomen pelvis are likely related to the recent surgery. Limited evaluation of the bowel anastomosis. 3. Large amount of abdominal ascites. 4. Diffuse subcutaneous edema. Small bilateral pleural effusions. Patchy lung densities that are nonspecific.  Echo 06/11/20:1. Left ventricular ejection fraction, by estimation, is 50 to 55%. The  left ventricle has low normal function. The left ventricular internal  cavity size was mildly dilated. There is mild left ventricular  hypertrophy. Left ventricular diastolic  parameters are indeterminate.  2. Right ventricular systolic function is normal. The right ventricular  size is mildly enlarged.  3. The mitral valve is normal in structure. Trivial mitral valve  regurgitation.  4. The aortic valve is tricuspid. Aortic valve regurgitation is trivial.  No aortic stenosis is present.    Treatments: Surgeries: 6/26: ex-lap 6/25: ex-lap with partial colectomy, partial small bowel resection, L salpinogoophorectomy 6/23: ex-lap with small bowel resection  Mechanical ventilation Sedation volume resuscitation- albumin, IVF Bicarbonate Antibiotics and antifungals Massive blood transfusion    Disposition: funeral home of the patient's family's choosing    Signed: Julian Hy 07-01-2020, 4:37 PM

## 2020-06-23 NOTE — Anesthesia Postprocedure Evaluation (Signed)
Anesthesia Post Note  Patient: Kari [name]  Procedure(s) Performed: EXPLORATORY LAPAROTOMY (N/A Abdomen)     Patient location during evaluation: Other Anesthesia Type: General Post-procedure mental status: Pt expired.   No complications documented.  Last Vitals:  Vitals:   06-20-20 1415 06-20-20 1430  BP:    Pulse: (!) 106 (!) 104  Resp: (!) 27 (!) 28  Temp:    SpO2: 100% 100%    Last Pain:  Vitals:   06/20/20 1109  TempSrc: Axillary  PainSc:                  Madalena Kesecker,W. EDMOND

## 2020-06-23 NOTE — Anesthesia Preprocedure Evaluation (Addendum)
Anesthesia Evaluation  Patient identified by MRN, date of birth, ID band Patient awake    Reviewed: Allergy & Precautions, H&P , NPO status , Patient's Chart, lab work & pertinent test results  Airway Mallampati: Intubated       Dental no notable dental hx. (+) Teeth Intact, Dental Advisory Given   Pulmonary asthma ,  Intubated on vent   Pulmonary exam normal breath sounds clear to auscultation       Cardiovascular hypertension, + Past MI and +CHF   Rhythm:Regular Rate:Normal     Neuro/Psych  Headaches, Depression    GI/Hepatic negative GI ROS, Neg liver ROS,   Endo/Other  Morbid obesity  Renal/GU Renal disease  negative genitourinary   Musculoskeletal  (+) Arthritis ,   Abdominal   Peds  Hematology  (+) Blood dyscrasia, anemia ,   Anesthesia Other Findings   Reproductive/Obstetrics negative OB ROS                             Anesthesia Physical Anesthesia Plan  ASA: IV and emergent  Anesthesia Plan: General   Post-op Pain Management:    Induction: Intravenous  PONV Risk Score and Plan: 3 and Treatment may vary due to age or medical condition  Airway Management Planned: Oral ETT  Additional Equipment: CVP and Ultrasound Guidance Line Placement  Intra-op Plan:   Post-operative Plan: Post-operative intubation/ventilation  Informed Consent: I have reviewed the patients History and Physical, chart, labs and discussed the procedure including the risks, benefits and alternatives for the proposed anesthesia with the patient or authorized representative who has indicated his/her understanding and acceptance.     Dental advisory given  Plan Discussed with: CRNA  Anesthesia Plan Comments:        Anesthesia Quick Evaluation

## 2020-06-23 NOTE — Progress Notes (Signed)
Patient ID: Kari [name], female   DOB: [birth year], 59 y.o.   MRN: [id number] Pressors, getting blood products without much help, on pressors, ct done, intraperitoneal blood.. needs to return to or asap for exploration, packing and open abdomen.

## 2020-06-23 NOTE — OR Nursing (Signed)
Thirty three 18x18 sponges left in abdomen as intentional packing for hemostasis by Dr. Jeanmarie Hubert.

## 2020-06-23 NOTE — Progress Notes (Addendum)
[facility] Progress Note Patient Name: Kari [name] DOB: Sep 19, 1961 MRN: [id number]   Date of Service  July 13, 2020  HPI/Events of Note  Latic Acid > 11.0 - CVP = 9. Patient getting several units PRBC for Hgb = 4.8. Hgb from 12 midnight = 6.8   eICU Interventions  Plan: 1. Lactic Acid level now. 2. 0.9 NaCl 1 liter IV over 1 hour now.  3. Transfuse an addition unit of PRBC.      Intervention Category Major Interventions: Acid-Base disturbance - evaluation and management  Erica Richwine Eugene Jul 13, 2020, 1:37 AM

## 2020-06-23 NOTE — Progress Notes (Signed)
eLink Physician-Brief Progress Note Patient Name: Kari [name] DOB: [birth year] MRN: [id number]   Date of Service  07-02-2020  HPI/Events of Note  Thrombocytopenia - Platelet count = 9.  eICU Interventions  Will transfuse 1 unit single donor platelets.      Intervention Category Major Interventions: Other:  Lysle Dingwall Jul 02, 2020, 5:43 AM

## 2020-06-23 NOTE — Progress Notes (Signed)
CRITICAL VALUE ALERT  Critical Value:  Ca 6.4  Date & Time Notied:  07-01-2020 @ 02:10  Provider Notified: Levada Schilling MD  Orders Received/Actions taken: TBD

## 2020-06-23 NOTE — Progress Notes (Signed)
[facility] Progress Note Patient Name: Kari [name] DOB: 05/05/1961 MRN: [id number]   Date of Service  06/29/20  HPI/Events of Note  Ca++ = 6.4 which corrects to 8.0 (Low) given albumin = 2.0.   eICU Interventions  Will replace Ca++.     Intervention Category Major Interventions: Electrolyte abnormality - evaluation and management  Torin Whisner Eugene 2020/06/29, 2:22 AM

## 2020-06-23 NOTE — Progress Notes (Signed)
Pt transported to CT via ventilator with no complications noted. Pt returned to 2M13.

## 2020-06-23 NOTE — Anesthesia Procedure Notes (Signed)
Central Venous Catheter Insertion Performed by: Roderic Palau, MD, anesthesiologist Start/End07-22-21 2:50 PM, 14-Jul-2020 3:00 PM Patient location: Pre-op. Preanesthetic checklist: patient identified, IV checked, site marked, risks and benefits discussed, surgical consent, monitors and equipment checked, pre-op evaluation, timeout performed and anesthesia consent Position: Trendelenburg Lidocaine 1% used for infiltration and patient sedated Hand hygiene performed , maximum sterile barriers used  and Seldinger technique used Catheter size: 9 Fr Total catheter length 10. Central line was placed.MAC introducer Procedure performed using ultrasound guided technique. Ultrasound Notes:anatomy identified, needle tip was noted to be adjacent to the nerve/plexus identified, no ultrasound evidence of intravascular and/or intraneural injection and image(s) printed for medical record Attempts: 1 Following insertion, line sutured, dressing applied and Biopatch. Post procedure assessment: blood return through all ports, free fluid flow and no air  Patient tolerated the procedure well with no immediate complications.

## 2020-06-23 NOTE — Progress Notes (Signed)
[facility] Progress Note  1 Day Post-Op  Subjective: Patient sedated on the vent. Being transfused for low hgb and plts this AM. Had some BM overnight but having feculent drainage from NGT as well.   Objective: Vital signs in last 24 hours: Temp:  [94.3 F (34.6 C)-97.9 F (36.6 C)] 96.1 F (35.6 C) (06/26 0832) Pulse Rate:  [30-131] 108 (06/26 0832) Resp:  [0-34] 25 (06/26 0832) BP: (43-123)/(17-73) 87/51 (06/26 0800) SpO2:  [91 %-100 %] 100 % (06/26 0832) Arterial Line BP: (78-138)/(44-93) 124/73 (06/26 0832) FiO2 (%):  [30 %-50 %] 50 % (06/26 0824) Weight:  [142.9 kg] 142.9 kg (06/26 0245) Last BM Date: 06/10/2020  Intake/Output from previous day: 06/25 0701 - 06/26 0700 In: 15749.4 [I.V.:7907.6; PNTIR:4431; NG/GT:60; IV Piggyback:3912.8] Out: 5110 [Urine:760; Emesis/NG output:500; Drains:250; Blood:200] Intake/Output this shift: Total I/O In: 5400 [I.V.:200; Blood:974] Out: -   PE: General: sedated on the vent, critically ill  Heart: tachycardic Lungs: ventilator  Abd: soft, mild distention, NGT with feculent appearing drainage, midline wound clean without bleeding MS: edema of bilateral thighs    Lab Results:  Recent Labs    07/13/20 0000 2020-07-13 0000 07/13/20 0521 07-13-20 0534  WBC 11.8*  --  13.8*  --   HGB 6.8*   < > 7.1* 6.5*  HCT 20.6*   < > 20.6* 19.0*  PLT 13*  --  9*  --    < > = values in this interval not displayed.   BMET Recent Labs    07-13-20 0000 07-13-2020 0000 07-13-20 0521 07/13/20 0534  NA 141   < > 142 140  K 3.7   < > 3.5 3.4*  CL 99  --  99  --   CO2 16*  --  14*  --   GLUCOSE 185*  --  156*  --   BUN 65*  --  65*  --   CREATININE 2.50*  --  2.48*  --   CALCIUM 6.4*  --  6.8*  --    < > = values in this interval not displayed.   PT/INR Recent Labs    06/04/2020 1347 Jul 13, 2020 0000  LABPROT 20.8* 22.9*  INR 1.9* 2.1*   CMP     Component Value Date/Time   NA 140 2020/07/13 0534   NA 140 12/31/2019 1044    K 3.4 (L) 13-Jul-2020 0534   CL 99 13-Jul-2020 0521   CO2 14 (L) 07-13-2020 0521   GLUCOSE 156 (H) 07-13-2020 0521   BUN 65 (H) 07/13/20 0521   BUN 18 12/31/2019 1044   CREATININE 2.48 (H) 07-13-2020 0521   CREATININE 0.83 03/31/2014 1010   CALCIUM 6.8 (L) 2020/07/13 0521   PROT <3.0 (L) 07/13/2020 0521   PROT 7.5 12/31/2019 1044   ALBUMIN 1.6 (L) 07-13-20 0521   ALBUMIN 3.5 (L) 12/31/2019 1044   AST 25 Jul 13, 2020 0521   ALT 14 07/13/2020 0521   ALKPHOS 33 (L) 2020/07/13 0521   BILITOT 1.4 (H) 07/13/2020 0521   BILITOT 0.7 12/31/2019 1044   GFRNONAA 21 (L) July 13, 2020 0521   GFRAA 24 (L) 2020-07-13 0521   Lipase     Component Value Date/Time   LIPASE 162 (H) 06/14/2020 1547       Studies/Results: No results found.  Anti-infectives: Anti-infectives (From admission, onward)   Start     Dose/Rate Route Frequency Ordered Stop   06/16/2020 1200  anidulafungin (ERAXIS) 100 mg in sodium chloride 0.9 % 100 mL IVPB  Discontinue    "Followed by" Linked Group Details   100 mg 78 mL/hr over 100 Minutes Intravenous Every 24 hours 06/16/20 1051     06/16/20 2200  ceFEPIme (MAXIPIME) 2 g in sodium chloride 0.9 % 100 mL IVPB  Status:  Discontinued        2 g 200 mL/hr over 30 Minutes Intravenous Every 24 hours 06/11/2020 1033 06/16/20 1141   06/16/20 1200  anidulafungin (ERAXIS) 200 mg in sodium chloride 0.9 % 200 mL IVPB       "Followed by" Linked Group Details   200 mg 78 mL/hr over 200 Minutes Intravenous  Once 06/16/20 1051 06/16/20 1308   06/16/20 1200  ceFEPIme (MAXIPIME) 2 g in sodium chloride 0.9 % 100 mL IVPB     Discontinue     2 g 200 mL/hr over 30 Minutes Intravenous Every 24 hours 06/16/20 1141     05/24/2020 1630  metroNIDAZOLE (FLAGYL) IVPB 500 mg     Discontinue     500 mg 100 mL/hr over 60 Minutes Intravenous Every 8 hours 06/10/2020 1615     06/14/20 1230  metroNIDAZOLE (FLAGYL) IVPB 500 mg  Status:  Discontinued        500 mg 100 mL/hr over 60 Minutes  Intravenous Every 8 hours 06/14/20 1142 06/22/2020 1615   06/14/20 1100  ceFEPIme (MAXIPIME) 2 g in sodium chloride 0.9 % 100 mL IVPB  Status:  Discontinued        2 g 200 mL/hr over 30 Minutes Intravenous Every 12 hours 06/14/20 1003 06/10/2020 1033   06/13/20 1030  cefTRIAXone (ROCEPHIN) 2 g in sodium chloride 0.9 % 100 mL IVPB  Status:  Discontinued        2 g 200 mL/hr over 30 Minutes Intravenous Every 24 hours 06/13/20 0933 06/14/20 1003   06/11/20 1500  vancomycin (VANCOREADY) IVPB 1250 mg/250 mL  Status:  Discontinued        1,250 mg 166.7 mL/hr over 90 Minutes Intravenous Every 24 hours 05/31/2020 1815 06/13/20 0933   06/11/20 0300  ceFEPIme (MAXIPIME) 2 g in sodium chloride 0.9 % 100 mL IVPB  Status:  Discontinued        2 g 200 mL/hr over 30 Minutes Intravenous Every 12 hours 06/01/2020 1815 06/13/20 0933   05/31/2020 1400  vancomycin (VANCOREADY) IVPB 2000 mg/400 mL        2,000 mg 200 mL/hr over 120 Minutes Intravenous  Once 06/06/2020 1347 06/01/2020 1739   05/25/2020 1345  vancomycin (VANCOCIN) IVPB 1000 mg/200 mL premix  Status:  Discontinued        1,000 mg 200 mL/hr over 60 Minutes Intravenous  Once 06/19/2020 1333 05/25/2020 1347   06/16/2020 1345  ceFEPIme (MAXIPIME) 2 g in sodium chloride 0.9 % 100 mL IVPB        2 g 200 mL/hr over 30 Minutes Intravenous  Once 06/07/2020 1333 06/13/2020 1733       Assessment/Plan CHF ICD Chronic venous stasis wounds HLD HTN Acute on Chronic kidney disease witholiguria   Septic Shock SBO and perforation of terminal ileum secondary to tumor Large chronic ovarian cyst - s/p exploratory laparotomy, resection of terminal ileum and cecum, application of open abdomen VAC 06/15/20 Dr. Marlou Starks - s/p exploratory laparotomy, removal of ovarian cyst, partial colectomy, small bowel resection, primary anastomosis and abdominal closure 06/17/20 Dr. Marlou Starks and Dr. Rosana Hoes - POD#3/1 - pathology consistent with adenocarcinoma, CEA and CA 125 WNL - patient still with  increasing lactate - plts 9K  this AM and hgb down - being transfused with PRBC and platelets currently - surgery was not unusually bloody, TXA given in OR - continue NGT on LIWS, would not recommend TF until much more stable - requiring 2 pressors   - overall prognosis very poor - consider PICC and TPN if more stable   FEN -NPO/NGT VTE -heparin SQ on hold given shock and low hgb/plts ID -cefepime/flagyl/eraxis  Appreciate resuscitative efforts by CCM. Patient remains critically ill and overall prognosis seems poor at this time.    LOS: 8 days    Norm Parcel , [facility] Surgery 2020/07/07, 8:39 AM Please see Amion for pager number during day hours 7:00am-4:30pm

## 2020-06-23 NOTE — Progress Notes (Addendum)
Patient had decreasing pressor requirements with resuscitation, now increasing again. Bruising along her back, enlarging right inner thigh.  Hb only increased 7.1--> 7.8 after 4 units pRBCs, 4 FFP, 4 platelets. No schistocytes, fibrinogen remains >100.   STAT CT w/ contrast C/A/P to go to mid-thighs, additional 2 units RBCs, FFP & platelets now. Restart vaso.  Kari Hy, DO 2020-07-12 1:00 PM [facility]

## 2020-06-23 NOTE — Care Plan (Addendum)
CT+ for significant free blood in the abdomen. Surgery aware- planning for ex-lap; will come back with wound vac. MTP ordered, TEG ordered.  Vaughan Basta updated via phone and consented by Dr. Donne Hazel over the phone.  Julian Hy, DO 2020-06-26 2:08 PM [facility]    Daughter Lia Foyer updated at bedside.  Julian Hy, DO 26-Jun-2020 2:24 PM [facility]

## 2020-06-23 NOTE — Transfer of Care (Signed)
Immediate Anesthesia Transfer of Care Note  Patient: Kari [name]  Procedure(s) Performed: EXPLORATORY LAPAROTOMY (N/A Abdomen)  Patient Location: deceased  Anesthesia Type:deceased  Level of Consciousness: deceased  Airway & Oxygen Therapy: deceased  Post-op Assessment: decesed  Post vital signs: deceased  Last Vitals:  Vitals Value Taken Time  BP    Temp    Pulse    Resp    SpO2      Last Pain:  Vitals:   July 11, 2020 1109  TempSrc: Axillary  PainSc:       Patients Stated Pain Goal:  (nausea) (67/59/16 3846)  Complications: deceased

## 2020-06-23 NOTE — Progress Notes (Signed)
NAME:  Kari [name], MRN:  [id number], DOB:  October 02, 1961, LOS: 8 ADMISSION DATE:  06/14/2020, CONSULTATION DATE:  11-Jul-2020 REFERRING MD:  Gifford Shave, MD, CHIEF COMPLAINT:  Weak  Brief History   [name] is a 59 y.o. 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  She was down for 13-14 hours, until her sister came to check on her and found her on the floor.  The patient reports not eating or drinking well over the past 2 weeks and has been feeling progressively weaker.  She lives alone and normally walks with a cane.  She denies any fever, cough, congestion, runny nose, pain with urination or diarrhea.  She has chronic venous stasis ulcers BLE, follows with Dr. Sharol Given, and states that she feels like her wounds are improving and denies increased redness, pain or edema.   Upon arrival to the ED, code sepsis was called due to hypotension and hypothermia (96.6 F). Lactic acid 2.0, WBC 12.0, hemoglobin 11.5, potassium 2.8, creatinine 2.40, albumin 1.4.  UA showed hazy urine with protein 30, few bacteria.  Chest x-ray showed no acute disease. Initial EKG showed sinus rhythm with PVCs, repeat EKG showed right bundle branch block with prolonged QTc  of 561.  Potassium was repleted with 10 mEq x3 and IV fluids were started at 30 cc/kg, 1 L of 5% albumin and another liter of crystalloid.  Past Medical History   Past Medical History:  Diagnosis Date  . Arthritis   . Asthma   . CHF (congestive heart failure) ([place]) 03/12/2014   a. EF 40-45% by echo in 06/2017 with normal cors by cath. ICD placed following  VT arrest  . Depression   . Headache(784.0)   . History of blood transfusion   . Hyperlipidemia   . Hypertension   . Lung nodules   . Myocardial infarction ([place]) 06/26/2014  . Renal disorder    followed by [facility]  . Sarcoidosis   . Ulcer    recurring, from chronic venous insufficiency   Significant Hospital Events   6/22 back on pressors, CVC placed, SBO diagnosed 6/23 ex-lap, A-line, intubated  Consults:  PCCM General surgery Cardiology Gyn Ortho  Procedures:  6/22 CVC 6/23 ETT 6/23 A-line 6/23 ex lap with colon resection of terminal ileum 6/25  Significant Diagnostic Tests:  Echo 6/19 > EF 50-55%. CT A / P 6/22 > SBO, persistent large pelvic cyst  Micro Data:  SARS Coronavirus 2 - Negative Blood: NG Urine: polymicrobial  Surgical cultures of abd 6/23: All pending AFB- Viral cx- Fungus- Aerobic/ anaerobic-    Antimicrobials:  Vancomycin 6/18 > 6/21 Cefepime 6/18 > 6/21.  6/22 >  Ceftriaxone 6/21 > 6/22 Flagyl 6/22> anidulafungin 6/24>  Interim history/subjective:  Decompensated overnight. Received 3 units pRBCs since OR (after 1st unit Hb 4.5), FFP, 2L crystalloid, albumin, 1 unit platelets. Had BM overnight, increasing NGT output.  Objective   Blood pressure (!) 49/31, pulse (!) 114, temperature (!) 96.4 F (35.8 C), temperature source Temporal, resp. rate (!) 22, height 5\' 7"  (1.702 m), weight (!) 142.9 kg, last menstrual period 11/21/2011, SpO2 100 %.  CVP:  [7 mmHg-9 mmHg] 7 mmHg  Vent Mode: PRVC FiO2 (%):  [30 %] 30 % Set Rate:  [16 bmp-20 bmp] 16 bmp Vt Set:  [490 mL] 490 mL PEEP:  [5 cmH20] 5 cmH20 Plateau Pressure:  [15 cmH20] 15 cmH20   Intake/Output Summary (Last 24 hours) at 2020/06/27 0724 Last data filed at June 27, 2020 2620 Gross per 24 hour  Intake 15749.4 ml  Output 5110 ml  Net 10639.4 ml   Filed Weights   06/11/2020 2320 06/11/20 1430 06-27-2020 0245  Weight: 107.2 kg 110.3 kg (!) 142.9 kg   Examination: General:  Critically ill-appearing woman lying in bed intubated, not sedated, unresponsive Neuro: Nonresponsive to verbal or physical stimulation.  Pupils dilated, reactive HEENT: Kari [name]/AT, scleral edema.  Less bleeding from her oral cavity.  Scabs on her lips. Cardiovascular: Tachycardic, regular rhythm Lungs: Rhonchi bilateral bases, minimal thin clear output from ET tube. Abdomen: More distended compared to postop yesterday.  Feculent output from NG tube.  Minimal blood-tinged serous drainage on abdominal dressing. musculoskeletal: Upper extremity edema, enlarging right medial thigh compared to previous exams.  Blisters overlying this region. Skin: Petechiae on her chest, denuded skin where previous adhesive has been removed.    Assessment & Plan:  Shock-hemorrhagic and septic from peritonitis -Large volume resuscitation this morning with matched units -Titrate down vasopressors as able; goal MAP greater than 65 -CT abdomen and pelvis to include right thigh once stabilized.  Septic shock due to peritonitis from SBO w/ perf. SBO 2/2 newly diagnosed abdominal mass. Cultures negative to date. -Volume resuscitation -Continue cefepime, Flagyl, and anidulafungin -Appreciate surgery's assistance. -NGT to suction  Acute respiratory failure with hypoxia requiring MV -Continue lung protective ventilation, P plat less than 30 and driving pressure less than 15 -Fentanyl as needed for pain control -Increase FiO2 until anemia resolves -SAT and SBT when appropriate-too unstable currently last night that is actually more than I would have guessed  Acute blood loss anemia, coagulopathy, severe thrombocytopenia-concern for DIC versus ongoing bleeding with associated coagulopathy -Matched large volume resuscitation -Supplemental calcium -CT scan once stabilized -Serial CBCs and coags -Adding on review evaluate for schistocytes to morning CBC  AKI on CKD stage III, pre-renal - due to poor oral intake past 2  weeks at presentation. Now volume down from SBO, sepsis, high ongoing abdominal losses Lactic acidosis due to sepsis Hypokalemia, improved Hypomagnesemia-resolved Hypophosphatemia-resolved hypernatremia-resolved -strict I's/O -Renally dose meds and avoid nephrotoxic medications -Continue monitoring serial lactates -Continue sodium bicarb infusion +d5w to manage hypernatremia -Serial lactate monitoring  Chronic venous stasis and ulcers (followed by Dr. Sharol Given) -con't wound care -Appreciate orthopedic surgery's input-staples removed 6/25  Protein malnutrition with severe hypoalbuminemia  -When more stable will likely require TPN during bowel rest -High risk for refeeding syndrome given prolonged reduced p.o. intake prior to admission  Prolonged QTc Frequent ectopy- chronic  H/o VF arrest, NICM presumed sarcoidosis; had recovery of EF. -Continue telemetry monitoring -Optimize electrolytes -AICD in place -Beta-blockers on hold while in shock  Acute encephalopathy due to sepsis -Supportive care -Continue mechanical ventilation  Progressive weakness s/p ground-level fall. -PT recommending SNF earlier in hospitalization -anticipate prolonged hospitalization and recovery  Terminal ileum adenocarcinoma; invasion through bowel wall, all lymph nodes negative -Appreciate surgery's input -Pathology pending  Pelvic cystic tumor-endometrioma, benign -Appreciate gynecology's input -Drained in the OR on 6/25  Best practice:  Diet:  NPO. Pain/Anxiety/Delirium protocol (if indicated): n/a VAP protocol (if indicated): yes DVT prophylaxis:  On hold until bleeding resolves GI prophylaxis:  n/a Mobility:  Bedrest   Code Status: Full Family Communication: Will update family at bedside today Disposition: ICU   This patient is critically ill with multiple organ system failure which requires frequent high complexity decision making, assessment, support, evaluation, and titration of  therapies. This was completed through the application of advanced monitoring technologies and extensive interpretation of multiple databases. During this encounter critical care time was devoted to patient care services described in this note for 45 minutes.  Julian Hy, DO 06-25-2020 7:50 AM  [facility]

## 2020-06-23 DEATH — deceased

## 2020-06-29 ENCOUNTER — Encounter (HOSPITAL_COMMUNITY): Payer: Self-pay

## 2020-07-06 LAB — CYTOLOGY - NON PAP
